# Patient Record
Sex: Female | Born: 1940 | Race: White | Hispanic: No | Marital: Married | Smoking: Former smoker
Health system: Southern US, Community
[De-identification: ages and names within clinical notes are randomized; demographics above are authoritative.]

## PROBLEM LIST (undated history)

## (undated) DIAGNOSIS — I10 Essential (primary) hypertension: Secondary | ICD-10-CM

## (undated) DIAGNOSIS — D649 Anemia, unspecified: Secondary | ICD-10-CM

## (undated) DIAGNOSIS — D126 Benign neoplasm of colon, unspecified: Secondary | ICD-10-CM

## (undated) DIAGNOSIS — E785 Hyperlipidemia, unspecified: Secondary | ICD-10-CM

## (undated) DIAGNOSIS — Z86711 Personal history of pulmonary embolism: Secondary | ICD-10-CM

## (undated) DIAGNOSIS — J449 Chronic obstructive pulmonary disease, unspecified: Secondary | ICD-10-CM

## (undated) DIAGNOSIS — Z9081 Acquired absence of spleen: Secondary | ICD-10-CM

## (undated) DIAGNOSIS — R350 Frequency of micturition: Secondary | ICD-10-CM

## (undated) DIAGNOSIS — IMO0001 Reserved for inherently not codable concepts without codable children: Secondary | ICD-10-CM

## (undated) DIAGNOSIS — I2510 Atherosclerotic heart disease of native coronary artery without angina pectoris: Secondary | ICD-10-CM

## (undated) DIAGNOSIS — M5090 Cervical disc disorder, unspecified, unspecified cervical region: Secondary | ICD-10-CM

## (undated) DIAGNOSIS — I82409 Acute embolism and thrombosis of unspecified deep veins of unspecified lower extremity: Secondary | ICD-10-CM

## (undated) DIAGNOSIS — N189 Chronic kidney disease, unspecified: Secondary | ICD-10-CM

## (undated) DIAGNOSIS — I714 Abdominal aortic aneurysm, without rupture, unspecified: Secondary | ICD-10-CM

## (undated) DIAGNOSIS — I639 Cerebral infarction, unspecified: Secondary | ICD-10-CM

## (undated) DIAGNOSIS — K5790 Diverticulosis of intestine, part unspecified, without perforation or abscess without bleeding: Secondary | ICD-10-CM

## (undated) DIAGNOSIS — Z951 Presence of aortocoronary bypass graft: Secondary | ICD-10-CM

## (undated) DIAGNOSIS — I219 Acute myocardial infarction, unspecified: Secondary | ICD-10-CM

## (undated) DIAGNOSIS — M069 Rheumatoid arthritis, unspecified: Secondary | ICD-10-CM

## (undated) DIAGNOSIS — M353 Polymyalgia rheumatica: Secondary | ICD-10-CM

## (undated) DIAGNOSIS — E039 Hypothyroidism, unspecified: Secondary | ICD-10-CM

## (undated) HISTORY — PX: CERVICAL SPINE SURGERY: SHX589

## (undated) HISTORY — PX: INGUINAL HERNIA REPAIR: SUR1180

## (undated) HISTORY — DX: Abdominal aortic aneurysm, without rupture: I71.4

## (undated) HISTORY — PX: COLONOSCOPY: SHX5424

## (undated) HISTORY — DX: Acute myocardial infarction, unspecified: I21.9

## (undated) HISTORY — DX: Abdominal aortic aneurysm, without rupture, unspecified: I71.40

## (undated) HISTORY — DX: Benign neoplasm of colon, unspecified: D12.6

## (undated) HISTORY — DX: Presence of aortocoronary bypass graft: Z95.1

## (undated) HISTORY — DX: Polymyalgia rheumatica: M35.3

## (undated) HISTORY — DX: Atherosclerotic heart disease of native coronary artery without angina pectoris: I25.10

## (undated) HISTORY — DX: Rheumatoid arthritis, unspecified: M06.9

## (undated) HISTORY — PX: OTHER SURGICAL HISTORY: SHX169

## (undated) HISTORY — PX: CARPAL TUNNEL RELEASE: SHX101

## (undated) HISTORY — DX: Diverticulosis of intestine, part unspecified, without perforation or abscess without bleeding: K57.90

## (undated) HISTORY — DX: Anemia, unspecified: D64.9

## (undated) HISTORY — DX: Hypothyroidism, unspecified: E03.9

## (undated) HISTORY — DX: Chronic obstructive pulmonary disease, unspecified: J44.9

## (undated) HISTORY — DX: Cervical disc disorder, unspecified, unspecified cervical region: M50.90

## (undated) HISTORY — DX: Essential (primary) hypertension: I10

## (undated) HISTORY — PX: CATARACT EXTRACTION: SUR2

## (undated) HISTORY — PX: APPENDECTOMY: SHX54

## (undated) HISTORY — PX: SPLENECTOMY: SUR1306

## (undated) HISTORY — DX: Hyperlipidemia, unspecified: E78.5

## (undated) HISTORY — DX: Acquired absence of spleen: Z90.81

---

## 1994-05-01 DIAGNOSIS — Z951 Presence of aortocoronary bypass graft: Secondary | ICD-10-CM

## 1994-05-01 HISTORY — PX: CORONARY ARTERY BYPASS GRAFT: SHX141

## 1994-05-01 HISTORY — PX: ABDOMINAL AORTIC ANEURYSM REPAIR: SUR1152

## 1994-05-01 HISTORY — DX: Presence of aortocoronary bypass graft: Z95.1

## 1995-01-01 DIAGNOSIS — Z951 Presence of aortocoronary bypass graft: Secondary | ICD-10-CM | POA: Insufficient documentation

## 1995-01-30 DIAGNOSIS — I2510 Atherosclerotic heart disease of native coronary artery without angina pectoris: Secondary | ICD-10-CM | POA: Insufficient documentation

## 1997-05-04 ENCOUNTER — Encounter: Payer: Self-pay | Admitting: Internal Medicine

## 1997-11-23 ENCOUNTER — Emergency Department (HOSPITAL_COMMUNITY): Admission: EM | Admit: 1997-11-23 | Discharge: 1997-11-23 | Payer: Self-pay | Admitting: Emergency Medicine

## 1998-05-12 ENCOUNTER — Ambulatory Visit (HOSPITAL_COMMUNITY): Admission: RE | Admit: 1998-05-12 | Discharge: 1998-05-12 | Payer: Self-pay | Admitting: Obstetrics and Gynecology

## 1998-05-12 ENCOUNTER — Encounter: Payer: Self-pay | Admitting: Obstetrics and Gynecology

## 1999-05-05 ENCOUNTER — Encounter: Admission: RE | Admit: 1999-05-05 | Discharge: 1999-05-05 | Payer: Self-pay | Admitting: Obstetrics and Gynecology

## 1999-05-05 ENCOUNTER — Encounter: Payer: Self-pay | Admitting: Obstetrics and Gynecology

## 1999-05-17 ENCOUNTER — Ambulatory Visit (HOSPITAL_COMMUNITY): Admission: RE | Admit: 1999-05-17 | Discharge: 1999-05-17 | Payer: Self-pay | Admitting: Internal Medicine

## 1999-05-17 ENCOUNTER — Encounter (INDEPENDENT_AMBULATORY_CARE_PROVIDER_SITE_OTHER): Payer: Self-pay | Admitting: Specialist

## 1999-05-23 ENCOUNTER — Ambulatory Visit (HOSPITAL_COMMUNITY): Admission: RE | Admit: 1999-05-23 | Discharge: 1999-05-23 | Payer: Self-pay | Admitting: Obstetrics and Gynecology

## 1999-05-23 ENCOUNTER — Encounter: Payer: Self-pay | Admitting: Obstetrics and Gynecology

## 2000-05-31 ENCOUNTER — Ambulatory Visit (HOSPITAL_COMMUNITY): Admission: RE | Admit: 2000-05-31 | Discharge: 2000-05-31 | Payer: Self-pay | Admitting: Obstetrics and Gynecology

## 2000-05-31 ENCOUNTER — Encounter: Payer: Self-pay | Admitting: Obstetrics and Gynecology

## 2000-06-22 ENCOUNTER — Other Ambulatory Visit: Admission: RE | Admit: 2000-06-22 | Discharge: 2000-06-22 | Payer: Self-pay | Admitting: Obstetrics and Gynecology

## 2000-07-16 LAB — HM COLONOSCOPY

## 2000-08-28 ENCOUNTER — Ambulatory Visit (HOSPITAL_COMMUNITY): Admission: RE | Admit: 2000-08-28 | Discharge: 2000-08-28 | Payer: Self-pay | Admitting: Cardiology

## 2001-06-03 ENCOUNTER — Ambulatory Visit (HOSPITAL_COMMUNITY): Admission: RE | Admit: 2001-06-03 | Discharge: 2001-06-03 | Payer: Self-pay | Admitting: *Deleted

## 2001-06-07 ENCOUNTER — Other Ambulatory Visit: Admission: RE | Admit: 2001-06-07 | Discharge: 2001-06-07 | Payer: Self-pay | Admitting: *Deleted

## 2001-10-14 ENCOUNTER — Encounter: Payer: Self-pay | Admitting: *Deleted

## 2001-10-14 ENCOUNTER — Ambulatory Visit (HOSPITAL_COMMUNITY): Admission: RE | Admit: 2001-10-14 | Discharge: 2001-10-14 | Payer: Self-pay | Admitting: *Deleted

## 2002-05-07 ENCOUNTER — Encounter: Payer: Self-pay | Admitting: Emergency Medicine

## 2002-05-07 ENCOUNTER — Emergency Department (HOSPITAL_COMMUNITY): Admission: EM | Admit: 2002-05-07 | Discharge: 2002-05-07 | Payer: Self-pay | Admitting: Emergency Medicine

## 2002-07-22 ENCOUNTER — Ambulatory Visit (HOSPITAL_COMMUNITY): Admission: RE | Admit: 2002-07-22 | Discharge: 2002-07-22 | Payer: Self-pay | Admitting: *Deleted

## 2002-07-24 ENCOUNTER — Encounter: Admission: RE | Admit: 2002-07-24 | Discharge: 2002-07-24 | Payer: Self-pay | Admitting: *Deleted

## 2002-08-04 ENCOUNTER — Other Ambulatory Visit: Admission: RE | Admit: 2002-08-04 | Discharge: 2002-08-04 | Payer: Self-pay | Admitting: *Deleted

## 2003-05-02 HISTORY — PX: CARDIAC CATHETERIZATION: SHX172

## 2003-09-14 ENCOUNTER — Ambulatory Visit (HOSPITAL_COMMUNITY): Admission: RE | Admit: 2003-09-14 | Discharge: 2003-09-14 | Payer: Self-pay | Admitting: *Deleted

## 2003-10-22 ENCOUNTER — Ambulatory Visit (HOSPITAL_COMMUNITY): Admission: RE | Admit: 2003-10-22 | Discharge: 2003-10-22 | Payer: Self-pay | Admitting: *Deleted

## 2003-12-11 ENCOUNTER — Encounter: Admission: RE | Admit: 2003-12-11 | Discharge: 2003-12-11 | Payer: Self-pay | Admitting: *Deleted

## 2003-12-25 ENCOUNTER — Emergency Department (HOSPITAL_COMMUNITY): Admission: EM | Admit: 2003-12-25 | Discharge: 2003-12-26 | Payer: Self-pay | Admitting: Emergency Medicine

## 2004-01-01 ENCOUNTER — Ambulatory Visit (HOSPITAL_COMMUNITY): Admission: RE | Admit: 2004-01-01 | Discharge: 2004-01-01 | Payer: Self-pay | Admitting: Cardiology

## 2004-03-10 ENCOUNTER — Emergency Department (HOSPITAL_COMMUNITY): Admission: EM | Admit: 2004-03-10 | Discharge: 2004-03-10 | Payer: Self-pay | Admitting: Emergency Medicine

## 2004-03-15 ENCOUNTER — Ambulatory Visit: Payer: Self-pay | Admitting: Internal Medicine

## 2004-05-04 ENCOUNTER — Ambulatory Visit: Payer: Self-pay | Admitting: Internal Medicine

## 2004-07-26 ENCOUNTER — Ambulatory Visit: Payer: Self-pay | Admitting: Internal Medicine

## 2004-08-10 ENCOUNTER — Ambulatory Visit: Payer: Self-pay | Admitting: Internal Medicine

## 2004-09-27 ENCOUNTER — Ambulatory Visit: Payer: Self-pay | Admitting: Internal Medicine

## 2004-09-28 ENCOUNTER — Ambulatory Visit (HOSPITAL_COMMUNITY): Admission: RE | Admit: 2004-09-28 | Discharge: 2004-09-28 | Payer: Self-pay | Admitting: *Deleted

## 2004-10-05 ENCOUNTER — Encounter: Admission: RE | Admit: 2004-10-05 | Discharge: 2004-10-05 | Payer: Self-pay | Admitting: *Deleted

## 2004-11-22 ENCOUNTER — Ambulatory Visit: Payer: Self-pay | Admitting: Internal Medicine

## 2005-02-15 ENCOUNTER — Ambulatory Visit: Payer: Self-pay | Admitting: Internal Medicine

## 2005-02-22 ENCOUNTER — Ambulatory Visit: Payer: Self-pay | Admitting: Internal Medicine

## 2005-03-29 ENCOUNTER — Ambulatory Visit: Payer: Self-pay | Admitting: Internal Medicine

## 2005-04-05 ENCOUNTER — Ambulatory Visit: Payer: Self-pay | Admitting: Internal Medicine

## 2005-07-06 ENCOUNTER — Ambulatory Visit: Payer: Self-pay | Admitting: Internal Medicine

## 2005-08-03 ENCOUNTER — Emergency Department (HOSPITAL_COMMUNITY): Admission: EM | Admit: 2005-08-03 | Discharge: 2005-08-03 | Payer: Self-pay | Admitting: Emergency Medicine

## 2005-10-12 ENCOUNTER — Ambulatory Visit (HOSPITAL_COMMUNITY): Admission: RE | Admit: 2005-10-12 | Discharge: 2005-10-12 | Payer: Self-pay | Admitting: *Deleted

## 2005-10-16 ENCOUNTER — Ambulatory Visit: Payer: Self-pay | Admitting: Internal Medicine

## 2005-10-17 ENCOUNTER — Ambulatory Visit: Payer: Self-pay | Admitting: Internal Medicine

## 2005-12-25 ENCOUNTER — Encounter: Admission: RE | Admit: 2005-12-25 | Discharge: 2005-12-25 | Payer: Self-pay | Admitting: *Deleted

## 2006-01-11 ENCOUNTER — Ambulatory Visit: Payer: Self-pay | Admitting: Internal Medicine

## 2006-01-17 ENCOUNTER — Ambulatory Visit: Payer: Self-pay | Admitting: Internal Medicine

## 2006-05-18 ENCOUNTER — Ambulatory Visit: Payer: Self-pay | Admitting: Internal Medicine

## 2006-05-18 LAB — CONVERTED CEMR LAB
ALT: 15 units/L (ref 0–40)
AST: 19 units/L (ref 0–37)
Alkaline Phosphatase: 59 units/L (ref 39–117)
BUN: 29 mg/dL — ABNORMAL HIGH (ref 6–23)
Calcium: 9.9 mg/dL (ref 8.4–10.5)
Cholesterol: 247 mg/dL (ref 0–200)
Direct LDL: 156.5 mg/dL
GFR calc Af Amer: 48 mL/min
GFR calc non Af Amer: 40 mL/min
Potassium: 4.5 meq/L (ref 3.5–5.1)
Total Protein: 6.7 g/dL (ref 6.0–8.3)

## 2006-05-21 ENCOUNTER — Ambulatory Visit: Payer: Self-pay | Admitting: Internal Medicine

## 2006-08-23 ENCOUNTER — Ambulatory Visit: Payer: Self-pay | Admitting: Internal Medicine

## 2006-08-23 LAB — CONVERTED CEMR LAB
ALT: 17 units/L (ref 0–40)
AST: 23 units/L (ref 0–37)
BUN: 32 mg/dL — ABNORMAL HIGH (ref 6–23)
Sodium: 144 meq/L (ref 135–145)
Total CHOL/HDL Ratio: 3.1

## 2006-08-28 ENCOUNTER — Ambulatory Visit: Payer: Self-pay | Admitting: Internal Medicine

## 2006-10-15 ENCOUNTER — Ambulatory Visit (HOSPITAL_COMMUNITY): Admission: RE | Admit: 2006-10-15 | Discharge: 2006-10-15 | Payer: Self-pay | Admitting: Internal Medicine

## 2006-10-19 ENCOUNTER — Encounter: Admission: RE | Admit: 2006-10-19 | Discharge: 2006-10-19 | Payer: Self-pay | Admitting: Internal Medicine

## 2006-10-23 ENCOUNTER — Emergency Department (HOSPITAL_COMMUNITY): Admission: EM | Admit: 2006-10-23 | Discharge: 2006-10-23 | Payer: Self-pay | Admitting: Emergency Medicine

## 2006-11-28 ENCOUNTER — Encounter: Payer: Self-pay | Admitting: Emergency Medicine

## 2006-11-29 ENCOUNTER — Inpatient Hospital Stay (HOSPITAL_COMMUNITY): Admission: EM | Admit: 2006-11-29 | Discharge: 2006-12-09 | Payer: Self-pay | Admitting: Internal Medicine

## 2006-12-08 ENCOUNTER — Encounter: Payer: Self-pay | Admitting: Internal Medicine

## 2006-12-08 DIAGNOSIS — E039 Hypothyroidism, unspecified: Secondary | ICD-10-CM | POA: Insufficient documentation

## 2006-12-08 DIAGNOSIS — K449 Diaphragmatic hernia without obstruction or gangrene: Secondary | ICD-10-CM

## 2006-12-08 DIAGNOSIS — Q8909 Congenital malformations of spleen: Secondary | ICD-10-CM

## 2006-12-08 DIAGNOSIS — I729 Aneurysm of unspecified site: Secondary | ICD-10-CM | POA: Insufficient documentation

## 2006-12-08 DIAGNOSIS — H3530 Unspecified macular degeneration: Secondary | ICD-10-CM | POA: Insufficient documentation

## 2006-12-08 DIAGNOSIS — I10 Essential (primary) hypertension: Secondary | ICD-10-CM | POA: Insufficient documentation

## 2006-12-08 DIAGNOSIS — N189 Chronic kidney disease, unspecified: Secondary | ICD-10-CM

## 2006-12-15 ENCOUNTER — Ambulatory Visit: Payer: Self-pay | Admitting: Family Medicine

## 2006-12-27 ENCOUNTER — Encounter: Admission: RE | Admit: 2006-12-27 | Discharge: 2006-12-27 | Payer: Self-pay | Admitting: Neurosurgery

## 2007-01-17 ENCOUNTER — Encounter: Admission: RE | Admit: 2007-01-17 | Discharge: 2007-01-17 | Payer: Self-pay | Admitting: Neurosurgery

## 2007-02-08 ENCOUNTER — Encounter: Admission: RE | Admit: 2007-02-08 | Discharge: 2007-02-08 | Payer: Self-pay | Admitting: Neurosurgery

## 2007-03-25 ENCOUNTER — Encounter: Payer: Self-pay | Admitting: Internal Medicine

## 2007-04-08 ENCOUNTER — Encounter: Payer: Self-pay | Admitting: Internal Medicine

## 2007-05-20 ENCOUNTER — Ambulatory Visit: Payer: Self-pay | Admitting: Internal Medicine

## 2007-05-20 LAB — CONVERTED CEMR LAB
Albumin: 4 g/dL (ref 3.5–5.2)
Alkaline Phosphatase: 57 units/L (ref 39–117)
BUN: 39 mg/dL — ABNORMAL HIGH (ref 6–23)
Calcium: 9.8 mg/dL (ref 8.4–10.5)
Creatinine, Ser: 1.5 mg/dL — ABNORMAL HIGH (ref 0.4–1.2)
GFR calc Af Amer: 44 mL/min
HDL: 71.1 mg/dL (ref >39.0)
Potassium: 5 meq/L (ref 3.5–5.1)
Total Bilirubin: 0.9 mg/dL (ref 0.3–1.2)
VLDL: 31 mg/dL (ref 0–40)
Vit D, 1,25-Dihydroxy: 43 (ref 30–89)

## 2007-05-27 ENCOUNTER — Ambulatory Visit: Payer: Self-pay | Admitting: Internal Medicine

## 2007-05-28 ENCOUNTER — Encounter: Admission: RE | Admit: 2007-05-28 | Discharge: 2007-05-28 | Payer: Self-pay | Admitting: Neurosurgery

## 2007-05-28 ENCOUNTER — Encounter: Payer: Self-pay | Admitting: Internal Medicine

## 2007-06-18 ENCOUNTER — Telehealth: Payer: Self-pay | Admitting: Internal Medicine

## 2007-09-24 ENCOUNTER — Ambulatory Visit: Payer: Self-pay | Admitting: Internal Medicine

## 2007-09-25 LAB — CONVERTED CEMR LAB
Albumin: 3.9 g/dL (ref 3.5–5.2)
BUN: 25 mg/dL — ABNORMAL HIGH (ref 6–23)
Calcium: 10 mg/dL (ref 8.4–10.5)
Cholesterol: 183 mg/dL (ref 0–200)
GFR calc Af Amer: 57 mL/min
Glucose, Bld: 105 mg/dL — ABNORMAL HIGH (ref 70–99)
HDL: 60.2 mg/dL (ref >39.0)
Total Protein: 6.6 g/dL (ref 6.0–8.3)
VLDL: 33 mg/dL (ref 0–40)

## 2007-10-16 ENCOUNTER — Ambulatory Visit (HOSPITAL_COMMUNITY): Admission: RE | Admit: 2007-10-16 | Discharge: 2007-10-16 | Payer: Self-pay | Admitting: Obstetrics

## 2008-01-07 ENCOUNTER — Encounter: Payer: Self-pay | Admitting: *Deleted

## 2008-01-08 ENCOUNTER — Encounter: Admission: RE | Admit: 2008-01-08 | Discharge: 2008-01-08 | Payer: Self-pay | Admitting: *Deleted

## 2008-01-15 ENCOUNTER — Ambulatory Visit: Payer: Self-pay | Admitting: Internal Medicine

## 2008-01-16 ENCOUNTER — Ambulatory Visit: Payer: Self-pay | Admitting: *Deleted

## 2008-01-27 ENCOUNTER — Ambulatory Visit: Payer: Self-pay | Admitting: Internal Medicine

## 2008-01-27 DIAGNOSIS — R05 Cough: Secondary | ICD-10-CM

## 2008-03-23 ENCOUNTER — Ambulatory Visit: Payer: Self-pay | Admitting: Internal Medicine

## 2008-03-23 DIAGNOSIS — H01009 Unspecified blepharitis unspecified eye, unspecified eyelid: Secondary | ICD-10-CM | POA: Insufficient documentation

## 2008-03-23 DIAGNOSIS — R197 Diarrhea, unspecified: Secondary | ICD-10-CM

## 2008-03-23 DIAGNOSIS — L719 Rosacea, unspecified: Secondary | ICD-10-CM | POA: Insufficient documentation

## 2008-03-24 ENCOUNTER — Encounter: Payer: Self-pay | Admitting: Internal Medicine

## 2008-03-24 LAB — CONVERTED CEMR LAB
ALT: 32 units/L (ref 0–35)
AST: 35 units/L (ref 0–37)
Albumin: 3.9 g/dL (ref 3.5–5.2)
HDL: 86.6 mg/dL (ref >39.0)
TSH: 5.83 microintl units/mL — ABNORMAL HIGH (ref 0.35–5.50)
Triglycerides: 92 mg/dL (ref 0–149)

## 2008-03-30 ENCOUNTER — Encounter: Payer: Self-pay | Admitting: Internal Medicine

## 2008-04-10 ENCOUNTER — Telehealth: Payer: Self-pay | Admitting: Internal Medicine

## 2008-06-10 ENCOUNTER — Telehealth: Payer: Self-pay | Admitting: Internal Medicine

## 2008-07-15 ENCOUNTER — Ambulatory Visit: Payer: Self-pay | Admitting: Internal Medicine

## 2008-07-21 ENCOUNTER — Ambulatory Visit: Payer: Self-pay | Admitting: Internal Medicine

## 2008-09-16 ENCOUNTER — Encounter: Payer: Self-pay | Admitting: Internal Medicine

## 2008-10-16 ENCOUNTER — Ambulatory Visit (HOSPITAL_COMMUNITY): Admission: RE | Admit: 2008-10-16 | Discharge: 2008-10-16 | Payer: Self-pay | Admitting: *Deleted

## 2008-11-10 ENCOUNTER — Ambulatory Visit: Payer: Self-pay | Admitting: Internal Medicine

## 2008-11-10 LAB — CONVERTED CEMR LAB
Alkaline Phosphatase: 95 units/L (ref 39–117)
BUN: 24 mg/dL — ABNORMAL HIGH (ref 6–23)
Bilirubin, Direct: 0.2 mg/dL (ref 0.0–0.3)
Chloride: 107 meq/L (ref 96–112)
Cholesterol: 183 mg/dL (ref 0–200)
Creatinine, Ser: 1.2 mg/dL (ref 0.4–1.2)
GFR calc non Af Amer: 47.16 mL/min (ref >60)
LDL Cholesterol: 92 mg/dL (ref 0–99)
Total Bilirubin: 0.7 mg/dL (ref 0.3–1.2)
Total Protein: 6.6 g/dL (ref 6.0–8.3)
Vit D, 25-Hydroxy: 41 ng/mL (ref 30–89)

## 2008-11-17 ENCOUNTER — Ambulatory Visit: Payer: Self-pay | Admitting: Internal Medicine

## 2008-11-26 ENCOUNTER — Telehealth: Payer: Self-pay | Admitting: Internal Medicine

## 2009-01-06 ENCOUNTER — Ambulatory Visit: Payer: Self-pay | Admitting: Internal Medicine

## 2009-05-01 DIAGNOSIS — M069 Rheumatoid arthritis, unspecified: Secondary | ICD-10-CM

## 2009-05-01 HISTORY — DX: Rheumatoid arthritis, unspecified: M06.9

## 2009-05-04 ENCOUNTER — Ambulatory Visit: Payer: Self-pay | Admitting: Internal Medicine

## 2009-05-04 LAB — CONVERTED CEMR LAB
ALT: 33 units/L (ref 0–35)
AST: 27 units/L (ref 0–37)
Alkaline Phosphatase: 81 units/L (ref 39–117)
Bilirubin, Direct: 0.2 mg/dL (ref 0.0–0.3)
CO2: 27 meq/L (ref 19–32)
Chloride: 105 meq/L (ref 96–112)
Creatinine, Ser: 1.1 mg/dL (ref 0.4–1.2)
LDL Cholesterol: 89 mg/dL (ref 0–99)
Potassium: 4.3 meq/L (ref 3.5–5.1)
Sodium: 141 meq/L (ref 135–145)
TSH: 0.81 microintl units/mL (ref 0.35–5.50)
Total CHOL/HDL Ratio: 2
Total Protein: 6.8 g/dL (ref 6.0–8.3)

## 2009-05-18 ENCOUNTER — Ambulatory Visit: Payer: Self-pay | Admitting: Internal Medicine

## 2009-05-18 DIAGNOSIS — Z87891 Personal history of nicotine dependence: Secondary | ICD-10-CM

## 2009-05-25 ENCOUNTER — Encounter: Payer: Self-pay | Admitting: Internal Medicine

## 2009-06-01 ENCOUNTER — Telehealth: Payer: Self-pay | Admitting: Internal Medicine

## 2009-07-06 ENCOUNTER — Telehealth: Payer: Self-pay | Admitting: Internal Medicine

## 2009-09-21 ENCOUNTER — Ambulatory Visit: Payer: Self-pay | Admitting: Internal Medicine

## 2009-09-22 LAB — CONVERTED CEMR LAB
ALT: 52 units/L — ABNORMAL HIGH (ref 0–35)
AST: 48 units/L — ABNORMAL HIGH (ref 0–37)
Bilirubin, Direct: 0.1 mg/dL (ref 0.0–0.3)
Chloride: 105 meq/L (ref 96–112)
GFR calc non Af Amer: 56.11 mL/min (ref >60)
LDL Cholesterol: 92 mg/dL (ref 0–99)
Potassium: 5 meq/L (ref 3.5–5.1)
Sodium: 143 meq/L (ref 135–145)
Total Bilirubin: 0.9 mg/dL (ref 0.3–1.2)
Total CHOL/HDL Ratio: 2
VLDL: 15.8 mg/dL (ref 0.0–40.0)

## 2009-09-24 ENCOUNTER — Ambulatory Visit: Payer: Self-pay | Admitting: Internal Medicine

## 2009-09-24 DIAGNOSIS — Z8639 Personal history of other endocrine, nutritional and metabolic disease: Secondary | ICD-10-CM

## 2009-09-24 DIAGNOSIS — Z862 Personal history of diseases of the blood and blood-forming organs and certain disorders involving the immune mechanism: Secondary | ICD-10-CM | POA: Insufficient documentation

## 2009-09-24 DIAGNOSIS — R21 Rash and other nonspecific skin eruption: Secondary | ICD-10-CM | POA: Insufficient documentation

## 2009-09-30 ENCOUNTER — Encounter (INDEPENDENT_AMBULATORY_CARE_PROVIDER_SITE_OTHER): Payer: Self-pay | Admitting: *Deleted

## 2009-10-05 ENCOUNTER — Encounter: Admission: RE | Admit: 2009-10-05 | Discharge: 2009-10-05 | Payer: Self-pay | Admitting: Internal Medicine

## 2009-10-26 ENCOUNTER — Ambulatory Visit (HOSPITAL_COMMUNITY): Admission: RE | Admit: 2009-10-26 | Discharge: 2009-10-26 | Payer: Self-pay | Admitting: Obstetrics

## 2009-10-28 ENCOUNTER — Telehealth: Payer: Self-pay | Admitting: Internal Medicine

## 2009-10-28 ENCOUNTER — Ambulatory Visit: Payer: Self-pay | Admitting: Internal Medicine

## 2009-11-02 LAB — CONVERTED CEMR LAB
ALT: 26 units/L (ref 0–35)
AST: 26 units/L (ref 0–37)
Bilirubin, Direct: 0.1 mg/dL (ref 0.0–0.3)
Direct LDL: 162.1 mg/dL
HDL: 67.1 mg/dL (ref >39.00)
Total Bilirubin: 0.7 mg/dL (ref 0.3–1.2)
Total Protein: 7 g/dL (ref 6.0–8.3)
Triglycerides: 91 mg/dL (ref 0.0–149.0)

## 2009-11-26 ENCOUNTER — Ambulatory Visit: Payer: Self-pay | Admitting: Internal Medicine

## 2009-11-26 DIAGNOSIS — M2560 Stiffness of unspecified joint, not elsewhere classified: Secondary | ICD-10-CM | POA: Insufficient documentation

## 2009-11-26 DIAGNOSIS — R209 Unspecified disturbances of skin sensation: Secondary | ICD-10-CM

## 2009-11-26 DIAGNOSIS — M2550 Pain in unspecified joint: Secondary | ICD-10-CM

## 2009-11-26 DIAGNOSIS — M79609 Pain in unspecified limb: Secondary | ICD-10-CM | POA: Insufficient documentation

## 2009-11-26 LAB — CONVERTED CEMR LAB
Alkaline Phosphatase: 107 units/L (ref 39–117)
Basophils Absolute: 0 10*3/uL (ref 0.0–0.1)
Bilirubin, Direct: 0.1 mg/dL (ref 0.0–0.3)
CO2: 29 meq/L (ref 19–32)
Calcium: 9.4 mg/dL (ref 8.4–10.5)
Creatinine, Ser: 1.4 mg/dL — ABNORMAL HIGH (ref 0.4–1.2)
Eosinophils Absolute: 0.3 10*3/uL (ref 0.0–0.7)
GFR calc non Af Amer: 38.4 mL/min (ref >60)
HCT: 39.7 % (ref 36.0–46.0)
Hemoglobin: 13 g/dL (ref 12.0–15.0)
Lymphs Abs: 2.7 10*3/uL (ref 0.7–4.0)
MCHC: 32.9 g/dL (ref 30.0–36.0)
Monocytes Absolute: 1 10*3/uL (ref 0.1–1.0)
Monocytes Relative: 8.9 % (ref 3.0–12.0)
Neutro Abs: 6.8 10*3/uL (ref 1.4–7.7)
Platelets: 402 10*3/uL — ABNORMAL HIGH (ref 150.0–400.0)
RDW: 14.8 % — ABNORMAL HIGH (ref 11.5–14.6)
Sed Rate: 32 mm/hr — ABNORMAL HIGH (ref 0–22)
Sodium: 134 meq/L — ABNORMAL LOW (ref 135–145)
TSH: 2.73 microintl units/mL (ref 0.35–5.50)
Total Bilirubin: 0.6 mg/dL (ref 0.3–1.2)
Total CK: 38 units/L (ref 7–177)
Total Protein: 6.8 g/dL (ref 6.0–8.3)
Vitamin B-12: 670 pg/mL (ref 211–911)

## 2009-11-30 ENCOUNTER — Telehealth: Payer: Self-pay | Admitting: Internal Medicine

## 2009-12-17 ENCOUNTER — Ambulatory Visit: Payer: Self-pay | Admitting: Internal Medicine

## 2009-12-28 ENCOUNTER — Encounter: Admission: RE | Admit: 2009-12-28 | Discharge: 2009-12-28 | Payer: Self-pay | Admitting: Surgery

## 2010-01-11 ENCOUNTER — Encounter: Payer: Self-pay | Admitting: Internal Medicine

## 2010-01-17 ENCOUNTER — Ambulatory Visit: Payer: Self-pay | Admitting: Surgery

## 2010-01-17 ENCOUNTER — Encounter: Payer: Self-pay | Admitting: Internal Medicine

## 2010-02-24 ENCOUNTER — Encounter: Payer: Self-pay | Admitting: Internal Medicine

## 2010-03-15 ENCOUNTER — Ambulatory Visit: Payer: Self-pay | Admitting: Internal Medicine

## 2010-03-15 LAB — CONVERTED CEMR LAB
BUN: 28 mg/dL — ABNORMAL HIGH (ref 6–23)
Chloride: 99 meq/L (ref 96–112)
Eosinophils Relative: 1 % (ref 0.0–5.0)
GFR calc non Af Amer: 40.32 mL/min (ref >60)
HCT: 40.7 % (ref 36.0–46.0)
MCV: 101.9 fL — ABNORMAL HIGH (ref 78.0–100.0)
Monocytes Relative: 12 % (ref 3.0–12.0)
Platelets: 338 10*3/uL (ref 150.0–400.0)
Potassium: 4.4 meq/L (ref 3.5–5.1)
RBC: 3.99 M/uL (ref 3.87–5.11)
Sed Rate: 5 mm/hr (ref 0–22)
Sodium: 139 meq/L (ref 135–145)
WBC: 15.1 10*3/uL — ABNORMAL HIGH (ref 4.5–10.5)

## 2010-03-22 ENCOUNTER — Ambulatory Visit: Payer: Self-pay | Admitting: Internal Medicine

## 2010-04-08 ENCOUNTER — Encounter: Payer: Self-pay | Admitting: Internal Medicine

## 2010-04-11 ENCOUNTER — Encounter
Admission: RE | Admit: 2010-04-11 | Discharge: 2010-04-11 | Payer: Self-pay | Source: Home / Self Care | Attending: Obstetrics | Admitting: Obstetrics

## 2010-05-27 ENCOUNTER — Encounter: Payer: Self-pay | Admitting: Internal Medicine

## 2010-05-31 NOTE — Progress Notes (Signed)
Summary: REFILL ON METOPROLOL  Phone Note Call from Patient Call back at Home Phone [phone]   Caller: Morrice Summary of Call: PT IS REQUESTING REFILLS ON METOPROLOL (TOPROL XL) 75 MG.  HE SAYS IT WILL RUN OUT BEFORE HIS NEXT ON SEPT 27.  HE USES [facility].  HIS PHONE NUMBER IS [phone]. Initial call taken by: Glena Norfolk,  October 28, 2009 9:17 AM    Prescriptions: TOPROL XL 25 MG XR24H-TAB (METOPROLOL SUCCINATE) 3 once daily  #270 x 1   Entered by:   Charlsie Quest, CMA   Authorized by:   Cassandria Anger MD   Signed by:   Charlsie Quest, CMA on 10/28/2009   Method used:   Electronically to        [facility]. [phone]* (retail)       [address]       [place], [place]  [address]       Ph: [phone]       Fax: [phone]   RxID:   [id number]

## 2010-05-31 NOTE — Assessment & Plan Note (Signed)
Summary: 3 wk rov /nws  #   Vital Signs:  Patient profile:   70 year old female Height:      63 inches Weight:      173 pounds BMI:     30.76 O2 Sat:      95 % on Room air Temp:     97.7 degrees F oral Pulse rate:   75 / minute Pulse rhythm:   regular Resp:     16 per minute BP sitting:   120 / 80  (left arm) Cuff size:   regular  Vitals Entered By: Jonathon Resides, CMA(AAMA) (December 17, 2009 9:13 AM)  O2 Flow:  Room air CC: 3 wk f/u Is Patient Diabetic? No   CC:  3 wk f/u.  History of Present Illness: F/u stiffness and pains - much better on Prednisone - back to nl  Current Medications (verified): 1)  Crestor 40 Mg Tabs (Rosuvastatin Calcium) .[name].. 1 By Mouth Qd 2)  Toprol Xl 25 Mg Xr24h-Tab (Metoprolol Succinate) .... 3 Once Daily 3)  Plavix 75 Mg  Tabs (Clopidogrel Bisulfate) .... Once Daily 4)  Vitamin D3 1000 Unit  Tabs (Cholecalciferol) .[name].. 1 Qd 5)  Vitamin-B Complex   Tabs (B Complex Vitamins) .[name].. 1 Qd 6)  Flonase 50 Mcg/act Susp (Fluticasone Propionate) .... 2 Sprays Each Day 7)  Tussionex Pennkinetic Er 8-10 Mg/43ml Lqcr (Chlorpheniramine-Hydrocodone) .[name].. 1 Tsp By Mouth Two Times A Day As Needed Cough 8)  Advair Diskus 100-50 Mcg/dose Misc (Fluticasone-Salmeterol) .[name].. 1 Puff 2 Times Daily 9)  Erythromycin 5 Mg/gm Oint (Erythromycin) .... Use Two Times A Day On Eyelids 10)  Levoxyl 88 Mcg Tabs (Levothyroxine Sodium) .[name].. 1 By Mouth Qd 11)  Cozaar 100 Mg Tabs (Losartan Potassium) .[name].. 1 By Mouth Qd 12)  Prednisone 10 Mg Tabs (Prednisone) .... 1.5 Tab Once Daily Pc 13)  Tramadol Hcl 50 Mg Tabs (Tramadol Hcl) .[name].. 1-2 Tabs By Mouth Two Times A Day As Needed Pain  Allergies (verified): 1)  ! Codeine 2)  ! Asa 3)  ! * Mtc 4)  Tramadol Hcl (Tramadol Hcl)  Past History:  Past Medical History: Coronary artery disease Hypertension Hypothyroidism cervical spine disc disease PMR 2011  Review of Systems  The patient denies fever, weight loss, weight gain, and  abdominal pain.    Physical Exam  General:  Well-developed,well-nourished,in no acute distress; alert,appropriate and cooperative throughout examination Mouth:  Oral mucosa and oropharynx without lesions or exudates.  Teeth in good repair. Lungs:  Normal respiratory effort, chest expands symmetrically. Lungs are clear to auscultation, no crackles or wheezes. Heart:  Normal rate and regular rhythm. S1 and S2 normal without gallop, murmur, click, rub or other extra sounds. Msk:  No deformity or scoliosis noted of thoracic or lumbar spine, NT.  Cervical spine is not  tender to palpation over paraspinal muscles and with the ROM . B knees are not  tender w/ROM  Skin:  Small facial papules; rosacea   Impression & Recommendations:  Problem # 1:  STIFFNESS (ICD-719.50) Assessment Improved Likely PMR  Problem # 2:  ARTHRALGIA (ICD-719.40)/PMR Assessment: Improved  Orders: Rheumatology Referral (Rheumatology)  Problem # 3:  HYPOTHYROIDISM (ICD-244.9) Assessment: Unchanged  Her updated medication list for this problem includes:    Levoxyl 88 Mcg Tabs (Levothyroxine sodium) .[name]... 1 by mouth qd  Complete Medication List: 1)  Crestor 40 Mg Tabs (Rosuvastatin calcium) .[name].. 1 by mouth qd 2)  Toprol Xl 25 Mg Xr24h-tab (Metoprolol succinate) .... 3 once  daily 3)  Plavix 75 Mg Tabs (Clopidogrel bisulfate) .... Once daily 4)  Vitamin D3 1000 Unit Tabs (Cholecalciferol) .[name].. 1 qd 5)  Vitamin-b Complex Tabs (B complex vitamins) .[name].. 1 qd 6)  Flonase 50 Mcg/act Susp (Fluticasone propionate) .... 2 sprays each day 7)  Tussionex Pennkinetic Er 8-10 Mg/32ml Lqcr (Chlorpheniramine-hydrocodone) .[name].. 1 tsp by mouth two times a day as needed cough 8)  Advair Diskus 100-50 Mcg/dose Misc (Fluticasone-salmeterol) .[name].. 1 puff 2 times daily 9)  Erythromycin 5 Mg/gm Oint (erythromycin)  .... Use two times a day on eyelids 10)  Levoxyl 88 Mcg Tabs (Levothyroxine sodium) .[name].. 1 by mouth qd 11)  Cozaar 100 Mg Tabs  (Losartan potassium) .[name].. 1 by mouth qd 12)  Tramadol Hcl 50 Mg Tabs (Tramadol hcl) .[name].. 1-2 tabs by mouth two times a day as needed pain 13)  Prednisone 1 Mg Tabs (Prednisone) .... As dirrected 14)  Prednisone 5 Mg Tabs (Prednisone) .... As dirrected  Patient Instructions: 1)  Please schedule a follow-up appointment in 3 months. 2)  BMP prior to visit, ICD-9: 3)  CBC w/ Diff prior to visit, ICD-9: 4)  ESR 725 5)  Prednisone - take 12.5 mg a day x 1 month, then 11 mg a day x 1 month, then decrease by 1 mg/day each month (take pc). Prescriptions: TOPROL XL 25 MG XR24H-TAB (METOPROLOL SUCCINATE) 3 once daily  #270 x 1   Entered and Authorized by:   Cassandria Anger MD   Signed by:   Cassandria Anger MD on 12/17/2009   Method used:   Print then Give to Patient   RxID:   [id number] COZAAR 100 MG TABS (LOSARTAN POTASSIUM) 1 by mouth qd  #90 x 3   Entered and Authorized by:   Cassandria Anger MD   Signed by:   Cassandria Anger MD on 12/17/2009   Method used:   Print then Give to Patient   RxID:   [id number] LEVOXYL 73 MCG TABS (LEVOTHYROXINE SODIUM) 1 by mouth qd  #90 x 3   Entered and Authorized by:   Cassandria Anger MD   Signed by:   Cassandria Anger MD on 12/17/2009   Method used:   Print then Give to Patient   RxID:   [id number] PLAVIX 66 MG  TABS (CLOPIDOGREL BISULFATE) once daily  #90 x 3   Entered and Authorized by:   Cassandria Anger MD   Signed by:   Cassandria Anger MD on 12/17/2009   Method used:   Print then Give to Patient   [id number] PREDNISONE 5 MG TABS (PREDNISONE) as dirrected  #100 x 3   Entered and Authorized by:   Cassandria Anger MD   Signed by:   Cassandria Anger MD on 12/17/2009   Method used:   Print then Give to Patient   [id number] PREDNISONE 1 MG TABS (PREDNISONE) as dirrected  #100 x 3   Entered and Authorized by:   Cassandria Anger MD   Signed by:   Cassandria Anger MD on  12/17/2009   Method used:   Print then Give to Patient   RxID:   [id number]

## 2010-05-31 NOTE — Letter (Signed)
Summary: [facility]  [facility]   Imported By: Phillis Knack 02/08/2010 09:09:01  _____________________________________________________________________  External Attachment:    Type:   Image     Comment:   External Document

## 2010-05-31 NOTE — Letter (Signed)
Summary: Hurley Cisco MD  Hurley Cisco MD   Imported By: Rise Patience 03/16/2010 11:50:03  _____________________________________________________________________  External Attachment:    Type:   Image     Comment:   External Document

## 2010-05-31 NOTE — Progress Notes (Signed)
  Phone Note Refill Request Message from:  Fax from Pharmacy on July 06, 2009 9:26 AM  Refills Requested: Medication #1:  CRESTOR 40 MG TABS 1 by mouth qd   Dosage confirmed as above?Dosage Confirmed   Supply Requested: 3 months  Method Requested: Fax to [facility] Initial call taken by: Estell Harpin CMA,  July 06, 2009 9:27 AM    Prescriptions: CRESTOR 40 MG TABS (ROSUVASTATIN CALCIUM) 1 by mouth qd  #90 x 3   Entered by:   Estell Harpin CMA   Authorized by:   Cassandria Anger MD   Signed by:   Estell Harpin CMA on 07/06/2009   Method used:   Faxed to ...       [facility] (mail-order)             ,          Ph: [phone]       Fax: [phone]   RxID:   [id number]

## 2010-05-31 NOTE — Progress Notes (Signed)
Summary: Pt questions for MD  Phone Note Call from Patient Call back at Home Phone [phone]   Caller: Patient Summary of Call: pt called requesting a call from MD with questions about Cozaar. I tried to ask pt what type of questions she had so I could try and help her but pt demanded to speak with MD only as she does not like to leave messages because she always has follow up questions to the answers she receives. please advise Initial call taken by: Crissie Sickles, [place],  June 01, 2009 10:34 AM  Follow-up for Phone Call        I clarified Cozaar dose Follow-up by: Cassandria Anger MD,  June 01, 2009 5:23 PM

## 2010-05-31 NOTE — Assessment & Plan Note (Signed)
Summary: 3 mos f/u #/cd   Vital Signs:  Patient profile:   70 year old female Height:      63 inches Weight:      174 pounds BMI:     30.93 Temp:     98.3 degrees F oral Pulse rate:   84 / minute Pulse rhythm:   regular Resp:     16 per minute BP sitting:   90 / 62  (left arm) Cuff size:   regular  Vitals Entered By: Jonathon Resides, CMA(AAMA) (March 22, 2010 9:38 AM) CC: 3 mo f/u Is Patient Diabetic? No   CC:  3 mo f/u.  History of Present Illness: The patient presents for a follow up of hypertension, CAD, RA, hyperlipidemia .  Current Medications (verified): 1)  Crestor 40 Mg Tabs (Rosuvastatin Calcium) .[name].. 1 By Mouth Qd 2)  Toprol Xl 25 Mg Xr24h-Tab (Metoprolol Succinate) .... 3 Once Daily 3)  Plavix 75 Mg  Tabs (Clopidogrel Bisulfate) .... Once Daily 4)  Vitamin D3 1000 Unit  Tabs (Cholecalciferol) .[name].. 1 Qd 5)  Vitamin-B Complex   Tabs (B Complex Vitamins) .[name].. 1 Qd 6)  Flonase 50 Mcg/act Susp (Fluticasone Propionate) .... 2 Sprays Each Day 7)  Tussionex Pennkinetic Er 8-10 Mg/58ml Lqcr (Chlorpheniramine-Hydrocodone) .[name].. 1 Tsp By Mouth Two Times A Day As Needed Cough 8)  Advair Diskus 100-50 Mcg/dose Misc (Fluticasone-Salmeterol) .[name].. 1 Puff 2 Times Daily 9)  Erythromycin 5 Mg/gm Oint (Erythromycin) .... Use Two Times A Day On Eyelids 10)  Levoxyl 88 Mcg Tabs (Levothyroxine Sodium) .[name].. 1 By Mouth Qd 11)  Cozaar 100 Mg Tabs (Losartan Potassium) .[name].. 1 By Mouth Qd 12)  Tramadol Hcl 50 Mg Tabs (Tramadol Hcl) .[name].. 1-2 Tabs By Mouth Two Times A Day As Needed Pain 13)  Prednisone 1 Mg Tabs (Prednisone) .... As Dirrected 14)  Prednisone 5 Mg Tabs (Prednisone) .... As Dirrected  Allergies (verified): 1)  ! Codeine 2)  ! Asa 3)  ! * Mtc 4)  Tramadol Hcl (Tramadol Hcl)  Past History:  Social History: Last updated: 07/21/2008 Retired Married Former Smoker Regular exercise-yes at [employer]  Past Medical History: Coronary artery  disease Hypertension Hypothyroidism cervical spine disc disease PMR 2011 RA 2011 Dr Charlestine Night  Review of Systems  The patient denies fever, dyspnea on exertion, and abdominal pain.    Physical Exam  General:  Well-developed,well-nourished,in no acute distress; alert,appropriate and cooperative throughout examination Eyes:  Nl B eyelids Nose:  External nasal examination shows no deformity or inflammation. Nasal mucosa are pink and moist without lesions or exudates. Mouth:  Oral mucosa and oropharynx without lesions or exudates.  Teeth in good repair. Neck:  No deformities, masses, or tenderness noted. Lungs:  Normal respiratory effort, chest expands symmetrically. Lungs are clear to auscultation, no crackles or wheezes. Heart:  Normal rate and regular rhythm. S1 and S2 normal without gallop, murmur, click, rub or other extra sounds. Abdomen:  Bowel sounds positive,abdomen soft and non-tender without masses, organomegaly or hernias noted. No HSM Msk:  No deformity or scoliosis noted of thoracic or lumbar spine, NT.  Cervical spine is not  tender to palpation over paraspinal muscles and with the ROM . B knees are not  tender w/ROM  Neurologic:  No cranial nerve deficits noted. Station and gait are normal. Plantar reflexes are down-going bilaterally. DTRs are symmetrical throughout. Sensory, motor and coordinative functions appear intact. Skin:  Small facial papules; rosacea Psych:  Cognition and judgment appear intact. Alert and cooperative  with normal attention span and concentration. No apparent delusions, illusions, hallucinations   Impression & Recommendations:  Problem # 1:  ARTHRALGIA (ICD-719.40) - poss RA Assessment Improved On the regimen of medicine(s) reflected in the chart  perr Dr Charlestine Night  Problem # 2:  STIFFNESS (ICD-719.50) Assessment: Improved  Problem # 3:  HYPOTHYROIDISM (ICD-244.9) Assessment: Unchanged  Her updated medication list for this problem includes:     Levoxyl 88 Mcg Tabs (Levothyroxine sodium) .[name]... 1 by mouth qd  Problem # 4:  RENAL FAILURE, CHRONIC (ICD-585.9) Assessment: Unchanged  Problem # 5:  CORONARY ARTERY DISEASE (ICD-414.00) Assessment: Unchanged  Her updated medication list for this problem includes:    Toprol Xl 25 Mg Xr24h-tab (Metoprolol succinate) .Marland KitchenMarland KitchenMarland KitchenMarland Kitchen 3 once daily    Plavix 75 Mg Tabs (Clopidogrel bisulfate) ..... Once daily    Cozaar 100 Mg Tabs (Losartan potassium) .[name]... 1 by mouth qd  Complete Medication List: 1)  Toprol Xl 25 Mg Xr24h-tab (Metoprolol succinate) .... 3 once daily 2)  Plavix 75 Mg Tabs (Clopidogrel bisulfate) .... Once daily 3)  Vitamin D3 1000 Unit Tabs (Cholecalciferol) .[name].. 1 qd 4)  Vitamin-b Complex Tabs (B complex vitamins) .[name].. 1 qd 5)  Flonase 50 Mcg/act Susp (Fluticasone propionate) .... 2 sprays each day 6)  Advair Diskus 100-50 Mcg/dose Misc (Fluticasone-salmeterol) .[name].. 1 puff 2 times daily 7)  Levoxyl 88 Mcg Tabs (Levothyroxine sodium) .[name].. 1 by mouth qd 8)  Cozaar 100 Mg Tabs (Losartan potassium) .[name].. 1 by mouth qd 9)  Tramadol Hcl 50 Mg Tabs (Tramadol hcl) .[name].. 1-2 tabs by mouth two times a day as needed pain 10)  Prednisone 5 Mg Tabs (Prednisone) .... As dirrected 11)  Rheumatrex 2.5 Mg Tabs (Methotrexate (anti-rheumatic)) .... 6 a day q 1 wk 12)  Lipitor 40 Mg Tabs (Atorvastatin calcium) .[name].. 1 by mouth qd  Patient Instructions: 1)  AMW w/labs 2)  Please schedule a follow-up appointment in 3 months. Prescriptions: COZAAR 100 MG TABS (LOSARTAN POTASSIUM) 1 by mouth qd  #90 x 3   Entered and Authorized by:   Cassandria Anger MD   Signed by:   Cassandria Anger MD on 03/22/2010   Method used:   Print then Give to Patient   RxID:   [id number] TOPROL XL 25 MG XR24H-TAB (METOPROLOL SUCCINATE) 3 once daily  #270 x 3   Entered and Authorized by:   Cassandria Anger MD   Signed by:   Cassandria Anger MD on 03/22/2010   Method used:   Print then Give to Patient    RxID:   [id number] LIPITOR 40 MG TABS (ATORVASTATIN CALCIUM) 1 by mouth qd  #90 x 3   Entered and Authorized by:   Cassandria Anger MD   Signed by:   Cassandria Anger MD on 03/22/2010   Method used:   Print then Give to Patient   RxID:   [id number]    Orders Added: 1)  Est. Patient Level IV [id number]   Immunization History:  Influenza Immunization History:    Influenza:  historical (01/12/2010)   Immunization History:  Influenza Immunization History:    Influenza:  Historical (01/12/2010)

## 2010-05-31 NOTE — Assessment & Plan Note (Signed)
Summary: 4 mos f/u / #/cd   Vital Signs:  Patient profile:   70 year old female Height:      63 inches Weight:      172 pounds BMI:     30.58 O2 Sat:      90 % on Room air Temp:     97.0 degrees F oral Pulse rate:   85 / minute BP sitting:   104 / 70  (left arm) Cuff size:   regular  Vitals Entered By: Charlynne Cousins CMA (Sep 24, 2009 10:40 AM)  O2 Flow:  Room air   History of Present Illness: The patient presents for a follow up of hypertension, hyperlipidemia, CAD C/o rash   Current Medications (verified): 1)  Crestor 40 Mg Tabs (Rosuvastatin Calcium) .[name].. 1 By Mouth Qd 2)  Toprol Xl 25 Mg Xr24h-Tab (Metoprolol Succinate) .... 3 Once Daily 3)  Plavix 75 Mg  Tabs (Clopidogrel Bisulfate) .... Once Daily 4)  Vitamin D3 1000 Unit  Tabs (Cholecalciferol) .[name].. 1 Qd 5)  Vitamin-B Complex   Tabs (B Complex Vitamins) .[name].. 1 Qd 6)  Flonase 50 Mcg/act Susp (Fluticasone Propionate) .... 2 Sprays Each Day 7)  Tussionex Pennkinetic Er 8-10 Mg/82ml Lqcr (Chlorpheniramine-Hydrocodone) .[name].. 1 Tsp By Mouth Two Times A Day As Needed Cough 8)  Advair Diskus 100-50 Mcg/dose Misc (Fluticasone-Salmeterol) .[name].. 1 Puff 2 Times Daily 9)  Erythromycin 5 Mg/gm Oint (Erythromycin) .... Use Two Times A Day On Eyelids 10)  Levoxyl 88 Mcg Tabs (Levothyroxine Sodium) .[name].. 1 By Mouth Qd 11)  Cozaar 100 Mg Tabs (Losartan Potassium) .[name].. 1 By Mouth Qd  Allergies (verified): 1)  ! Codeine 2)  ! Asa 3)  ! * Mtc 4)  Tramadol Hcl (Tramadol Hcl)  Past History:  Past Medical History: Last updated: 01/15/2008 Coronary artery disease Hypertension Hypothyroidism cervical spine disc disease  Social History: Last updated: 07/21/2008 Retired Married Former Smoker Regular exercise-yes at [employer]  Review of Systems  The patient denies fever, chest pain, dyspnea on exertion, abdominal pain, and melena.    Physical Exam  General:  Well-developed,well-nourished,in no acute distress; alert,appropriate and  cooperative throughout examination Nose:  External nasal examination shows no deformity or inflammation. Nasal mucosa are pink and moist without lesions or exudates. Mouth:  Oral mucosa and oropharynx without lesions or exudates.  Teeth in good repair. Neck:  No deformities, masses, or tenderness noted. Lungs:  Normal respiratory effort, chest expands symmetrically. Lungs are clear to auscultation, no crackles or wheezes. Heart:  Normal rate and regular rhythm. S1 and S2 normal without gallop, murmur, click, rub or other extra sounds. Abdomen:  Bowel sounds positive,abdomen soft and non-tender without masses, organomegaly or hernias noted. No HSM Msk:  No deformity or scoliosis noted of thoracic or lumbar spine.   Extremities:  No clubbing, cyanosis, edema, or deformity noted with normal full range of motion of all joints.   Neurologic:  No cranial nerve deficits noted. Station and gait are normal. Plantar reflexes are down-going bilaterally. DTRs are symmetrical throughout. Sensory, motor and coordinative functions appear intact. Skin:  Small papules; rosacea Psych:  Cognition and judgment appear intact. Alert and cooperative with normal attention span and concentration. No apparent delusions, illusions, hallucinations   Impression & Recommendations:  Problem # 1:  LIVER FUNCTION TESTS, ABNORMAL, HX OF (ICD-V12.2) Assessment New  Hold Crestor Liver US The labs were reviewed with the patient.   Orders: Radiology Referral (Radiology)  Problem # 2:  RENAL FAILURE, CHRONIC (ICD-585.9) Assessment:  Improved The labs were reviewed with the patient.   Problem # 3:  HYPERTENSION (ICD-401.9) Assessment: Unchanged  Her updated medication list for this problem includes:    Toprol Xl 25 Mg Xr24h-tab (Metoprolol succinate) .Marland KitchenMarland KitchenMarland KitchenMarland Kitchen 3 once daily    Cozaar 100 Mg Tabs (Losartan potassium) .[name]... 1 by mouth qd  Problem # 4:  CORONARY ARTERY DISEASE (ICD-414.00) Assessment: Unchanged  Her updated  medication list for this problem includes:    Toprol Xl 25 Mg Xr24h-tab (Metoprolol succinate) .Marland KitchenMarland KitchenMarland KitchenMarland Kitchen 3 once daily    Plavix 75 Mg Tabs (Clopidogrel bisulfate) ..... Once daily    Cozaar 100 Mg Tabs (Losartan potassium) .[name]... 1 by mouth qd  Problem # 5:  SKIN RASH (ICD-782.1) Assessment: Comment Only  Orders: Dermatology Referral (Derma) per pt's request to check a few things  Complete Medication List: 1)  Crestor 40 Mg Tabs (Rosuvastatin calcium) .[name].. 1 by mouth qd 2)  Toprol Xl 25 Mg Xr24h-tab (Metoprolol succinate) .... 3 once daily 3)  Plavix 75 Mg Tabs (Clopidogrel bisulfate) .... Once daily 4)  Vitamin D3 1000 Unit Tabs (Cholecalciferol) .[name].. 1 qd 5)  Vitamin-b Complex Tabs (B complex vitamins) .[name].. 1 qd 6)  Flonase 50 Mcg/act Susp (Fluticasone propionate) .... 2 sprays each day 7)  Tussionex Pennkinetic Er 8-10 Mg/17ml Lqcr (Chlorpheniramine-hydrocodone) .[name].. 1 tsp by mouth two times a day as needed cough 8)  Advair Diskus 100-50 Mcg/dose Misc (Fluticasone-salmeterol) .[name].. 1 puff 2 times daily 9)  Erythromycin 5 Mg/gm Oint (erythromycin)  .... Use two times a day on eyelids 10)  Levoxyl 88 Mcg Tabs (Levothyroxine sodium) .[name].. 1 by mouth qd 11)  Cozaar 100 Mg Tabs (Losartan potassium) .[name].. 1 by mouth qd  Patient Instructions: 1)  Stop Crestor for now 2)  Labs in 1 month 3)  Lipid Panel prior to visit, ICD-9: 4)  Hepatic Panel prior to visit, ICD-9: 5)  Please schedule a follow-up appointment in 4 months.

## 2010-05-31 NOTE — Assessment & Plan Note (Signed)
Summary: 6 mth fu/$50/jss   Vital Signs:  Patient profile:   70 year old female Weight:      173 pounds BMI:     30.76 Temp:     97.2 degrees F oral Pulse rate:   73 / minute BP sitting:   124 / 88  (left arm)  Vitals Entered By: Doralee Albino (May 18, 2009 4:35 PM) CC: f/u Is Patient Diabetic? No   CC:  f/u.  History of Present Illness: The patient presents for a follow up of hypertension, CAD, hyperlipidemia   Preventive Screening-Counseling & Management  Alcohol-Tobacco     Smoking Status: quit  Current Medications (verified): 1)  Crestor 40 Mg Tabs (Rosuvastatin Calcium) .[name].. 1 By Mouth Qd 2)  Toprol Xl 25 Mg Xr24h-Tab (Metoprolol Succinate) .... 3 Once Daily 3)  Plavix 75 Mg  Tabs (Clopidogrel Bisulfate) .... Once Daily 4)  Vitamin D3 1000 Unit  Tabs (Cholecalciferol) .[name].. 1 Qd 5)  Vitamin-B Complex   Tabs (B Complex Vitamins) .[name].. 1 Qd 6)  Augmentin 875-125 Mg Tabs (Amoxicillin-Pot Clavulanate) .[name].. 1 By Mouth Three Times A Day As Needed Fever 7)  Flonase 50 Mcg/act Susp (Fluticasone Propionate) .... 2 Sprays Each Day 8)  Tussionex Pennkinetic Er 8-10 Mg/59ml Lqcr (Chlorpheniramine-Hydrocodone) .[name].. 1 Tsp By Mouth Two Times A Day As Needed Cough 9)  Prednisone 10 Mg  Tabs (Prednisone) .... Take 40mg  Qd For 3 Days, Then 20 Mg Qd For 3 Days, Then 10mg  Qd For 6 Days, Then Stop. Take Pc. 10)  Advair Diskus 100-50 Mcg/dose Misc (Fluticasone-Salmeterol) .[name].. 1 Puff 2 Times Daily 11)  Erythromycin 5 Mg/gm Oint (Erythromycin) .... Use Two Times A Day On Eyelids 12)  Levoxyl 88 Mcg Tabs (Levothyroxine Sodium) .[name].. 1 By Mouth Qd 13)  Micardis 80 Mg Tabs (Telmisartan) .[name].. 1 By Mouth Daily  Allergies: 1)  ! Codeine 2)  ! Asa 3)  ! * Mtc 4)  Tramadol Hcl (Tramadol Hcl)  Past History:  Past Medical History: Last updated: 01/15/2008 Coronary artery disease Hypertension Hypothyroidism cervical spine disc disease  Past Surgical History: Last updated:  05/27/2007 Coronary artery bypass graft Splenectomy Cardiac cath C spine plate 2008 Dr Saintclair Halsted  Physical Exam  General:  Well-developed,well-nourished,in no acute distress; alert,appropriate and cooperative throughout examination Nose:  External nasal examination shows no deformity or inflammation. Nasal mucosa are pink and moist without lesions or exudates. Mouth:  Oral mucosa and oropharynx without lesions or exudates.  Teeth in good repair. Lungs:  Normal respiratory effort, chest expands symmetrically. Lungs are clear to auscultation, no crackles or wheezes. Heart:  Normal rate and regular rhythm. S1 and S2 normal without gallop, murmur, click, rub or other extra sounds. Abdomen:  Bowel sounds positive,abdomen soft and non-tender without masses, organomegaly or hernias noted. Msk:  No deformity or scoliosis noted of thoracic or lumbar spine.   Neurologic:  No cranial nerve deficits noted. Station and gait are normal. Plantar reflexes are down-going bilaterally. DTRs are symmetrical throughout. Sensory, motor and coordinative functions appear intact. Skin:  rosacea Psych:  Cognition and judgment appear intact. Alert and cooperative with normal attention span and concentration. No apparent delusions, illusions, hallucinations   Impression & Recommendations:  Problem # 1:  HYPERTENSION (ICD-401.9)  The following medications were removed from the medication list:    Micardis 80 Mg Tabs (Telmisartan) .[name]... 1 by mouth daily Her updated medication list for this problem includes:    Toprol Xl 25 Mg Xr24h-tab (Metoprolol succinate) .Marland KitchenMarland KitchenMarland KitchenMarland Kitchen 3 once  daily    Cozaar 100 Mg Tabs (Losartan potassium) .[name]... 1 by mouth qd  Problem # 2:  HYPOTHYROIDISM (ICD-244.9)  Her updated medication list for this problem includes:    Levoxyl 88 Mcg Tabs (Levothyroxine sodium) .[name]... 1 by mouth qd  Problem # 3:  CORONARY ARTERY DISEASE (ICD-414.00)  The following medications were removed from the medication  list:    Micardis 80 Mg Tabs (Telmisartan) .[name]... 1 by mouth daily Her updated medication list for this problem includes:    Toprol Xl 25 Mg Xr24h-tab (Metoprolol succinate) .Marland KitchenMarland KitchenMarland KitchenMarland Kitchen 3 once daily    Plavix 75 Mg Tabs (Clopidogrel bisulfate) ..... Once daily    Cozaar 100 Mg Tabs (Losartan potassium) .[name]... 1 by mouth qd  Problem # 4:  HYPOTHYROIDISM (ICD-244.9)  Her updated medication list for this problem includes:    Levoxyl 88 Mcg Tabs (Levothyroxine sodium) .[name]... 1 by mouth qd  Complete Medication List: 1)  Crestor 40 Mg Tabs (Rosuvastatin calcium) .[name].. 1 by mouth qd 2)  Toprol Xl 25 Mg Xr24h-tab (Metoprolol succinate) .... 3 once daily 3)  Plavix 75 Mg Tabs (Clopidogrel bisulfate) .... Once daily 4)  Vitamin D3 1000 Unit Tabs (Cholecalciferol) .[name].. 1 qd 5)  Vitamin-b Complex Tabs (B complex vitamins) .[name].. 1 qd 6)  Augmentin 875-125 Mg Tabs (Amoxicillin-pot clavulanate) .[name].. 1 by mouth three times a day as needed fever 7)  Flonase 50 Mcg/act Susp (Fluticasone propionate) .... 2 sprays each day 8)  Tussionex Pennkinetic Er 8-10 Mg/85ml Lqcr (Chlorpheniramine-hydrocodone) .[name].. 1 tsp by mouth two times a day as needed cough 9)  Prednisone 10 Mg Tabs (Prednisone) .... Take 40mg  qd for 3 days, then 20 mg qd for 3 days, then 10mg  qd for 6 days, then stop. take pc. 10)  Advair Diskus 100-50 Mcg/dose Misc (Fluticasone-salmeterol) .[name].. 1 puff 2 times daily 11)  Erythromycin 5 Mg/gm Oint (erythromycin)  .... Use two times a day on eyelids 12)  Levoxyl 88 Mcg Tabs (Levothyroxine sodium) .[name].. 1 by mouth qd 13)  Cozaar 100 Mg Tabs (Losartan potassium) .[name].. 1 by mouth qd  Patient Instructions: 1)  Please schedule a follow-up appointment in 4 months. 2)  BMP prior to visit, ICD-9: 3)  Hepatic Panel prior to visit, ICD-9: 4)  Lipid Panel prior to visit, ICD-9:272.0  995.20 Prescriptions: COZAAR 100 MG TABS (LOSARTAN POTASSIUM) 1 by mouth qd  #90 x 3   Entered and Authorized by:   Cassandria Anger  MD   Signed by:   Cassandria Anger MD on 05/18/2009   Method used:   Print then Give to Patient   RxID:   [id number]

## 2010-05-31 NOTE — Letter (Signed)
Summary: [facility]   Imported By: Bubba Hales 06/03/2009 09:41:10  _____________________________________________________________________  External Attachment:    Type:   Image     Comment:   External Document

## 2010-05-31 NOTE — Letter (Signed)
Summary: [facility] Consult Scheduled Letter  [facility]   [place], [place] [address]   Phone: [phone]  Fax: [phone]      09/30/2009 MRN: [id number]  AIA HEDGES [address] [place], [place]  [address]    Dear Ms. Bergey,      We have scheduled an appointment for you.  At the recommendation of Dr.Plotnikov, we have scheduled you a consult with Dr Wilhemina Bonito on 11/29/09 at 8:45am.  Their phone number is [phone].  If this appointment day and time is not convenient for you, please feel free to call the office of the doctor you are being referred to at the number listed above and reschedule the appointment.     [facility] [id number] [place], [place] [address]    Thank you,  Patient Care Coordinator [facility]

## 2010-05-31 NOTE — Progress Notes (Signed)
Summary: CALL  Phone Note Call from Patient Call back at Home Phone [phone]   Summary of Call: Pt left vm that she was told to call MD back on Tuesday. She is req a call back.  Initial call taken by: Charlsie Quest, [place],  November 30, 2009 3:06 PM  Follow-up for Phone Call        pt is feeling much better Follow-up by: Cassandria Anger MD,  November 30, 2009 5:22 PM

## 2010-05-31 NOTE — Letter (Signed)
Summary: Hurley Cisco MD  Hurley Cisco MD   Imported By: Rise Patience 01/21/2010 14:34:47  _____________________________________________________________________  External Attachment:    Type:   Image     Comment:   External Document

## 2010-05-31 NOTE — Assessment & Plan Note (Signed)
Summary: knee pain--foot swelling--stc   Vital Signs:  Patient profile:   70 year old female Height:      63 inches Weight:      172 pounds BMI:     30.58 O2 Sat:      93 % on Room air Temp:     97.8 degrees F oral Pulse rate:   79 / minute Pulse rhythm:   regular Resp:     16 per minute BP sitting:   102 / 70  (left arm) Cuff size:   regular  Vitals Entered By: Jonathon Resides, CMA(AAMA) (November 26, 2009 2:53 PM)  O2 Flow:  Room air CC: Bilatertal knee pain/feet swelling Is Patient Diabetic? No   CC:  Bilatertal knee pain/feet swelling.  History of Present Illness: C/o knee pain B x months  She went to UC once C/o feet swelling C/o stiffness in am's x 2 hrs, pain in neck shoulders and legs x wks - severe at times, weakness.   Current Medications (verified): 1)  Crestor 40 Mg Tabs (Rosuvastatin Calcium) .[name].. 1 By Mouth Qd 2)  Toprol Xl 25 Mg Xr24h-Tab (Metoprolol Succinate) .... 3 Once Daily 3)  Plavix 75 Mg  Tabs (Clopidogrel Bisulfate) .... Once Daily 4)  Vitamin D3 1000 Unit  Tabs (Cholecalciferol) .[name].. 1 Qd 5)  Vitamin-B Complex   Tabs (B Complex Vitamins) .[name].. 1 Qd 6)  Flonase 50 Mcg/act Susp (Fluticasone Propionate) .... 2 Sprays Each Day 7)  Tussionex Pennkinetic Er 8-10 Mg/10ml Lqcr (Chlorpheniramine-Hydrocodone) .[name].. 1 Tsp By Mouth Two Times A Day As Needed Cough 8)  Advair Diskus 100-50 Mcg/dose Misc (Fluticasone-Salmeterol) .[name].. 1 Puff 2 Times Daily 9)  Erythromycin 5 Mg/gm Oint (Erythromycin) .... Use Two Times A Day On Eyelids 10)  Levoxyl 88 Mcg Tabs (Levothyroxine Sodium) .[name].. 1 By Mouth Qd 11)  Cozaar 100 Mg Tabs (Losartan Potassium) .[name].. 1 By Mouth Qd  Allergies (verified): 1)  ! Codeine 2)  ! Asa 3)  ! * Mtc 4)  Tramadol Hcl (Tramadol Hcl)  Past History:  Past Medical History: Last updated: 01/15/2008 Coronary artery disease Hypertension Hypothyroidism cervical spine disc disease  Social History: Last updated:  07/21/2008 Retired Married Former Smoker Regular exercise-yes at [employer]  Review of Systems  The patient denies fever, dyspnea on exertion, abdominal pain, and melena.    Physical Exam  General:  Well-developed,well-nourished,in no acute distress; alert,appropriate and cooperative throughout examination Head:  Normocephalic and atraumatic without obvious abnormalities. No apparent alopecia or balding. No pulsating arteries Ears:  External ear exam shows no significant lesions or deformities.  Otoscopic examination reveals clear canals, tympanic membranes are intact bilaterally without bulging, retraction, inflammation or discharge. Hearing is grossly normal bilaterally. Mouth:  Oral mucosa and oropharynx without lesions or exudates.  Teeth in good repair. Neck:  No deformities, masses, or tenderness noted. Lungs:  Normal respiratory effort, chest expands symmetrically. Lungs are clear to auscultation, no crackles or wheezes. Heart:  Normal rate and regular rhythm. S1 and S2 normal without gallop, murmur, click, rub or other extra sounds. Abdomen:  Bowel sounds positive,abdomen soft and non-tender without masses, organomegaly or hernias noted. No HSM Msk:  No deformity or scoliosis noted of thoracic or lumbar spine.  Cervical spine is tender to palpation over paraspinal muscles and with the ROM . B knees are tender w/ROM R 1st MTP is swollen and tender Extremities:  No clubbing, cyanosis, edema, or deformity noted with normal full range of motion of all joints.  Neurologic:  No cranial nerve deficits noted. Station and gait are normal. Plantar reflexes are down-going bilaterally. DTRs are symmetrical throughout. Sensory, motor and coordinative functions appear intact. Skin:  Small papules; rosacea Psych:  Cognition and judgment appear intact. Alert and cooperative with normal attention span and concentration. No apparent delusions, illusions, hallucinations   Impression &  Recommendations:  Problem # 1:  ARTHRALGIA (ICD-719.40) - poss PMR Assessment New Discussed - empiric steroids Orders: TLB-B12, Serum-Total ONLY [id number] TLB-CBC Platelet - w/Differential (85025-CBCD) TLB-BMP (Basic Metabolic Panel-BMET) (99991111) TLB-Hepatic/Liver Function Pnl (80076-HEPATIC) TLB-Uric Acid, Blood (84550-URIC) TLB-Sedimentation Rate (ESR) (85652-ESR) TLB-TSH (Thyroid Stimulating Hormone) (84443-TSH) TLB-CK Total Only(Creatine Kinase/CPK) (82550-CK)  Problem # 2:  PARESTHESIA (ICD-782.0) Assessment: New  Orders: TLB-B12, Serum-Total ONLY [id number] TLB-CBC Platelet - w/Differential (85025-CBCD) TLB-BMP (Basic Metabolic Panel-BMET) (99991111) TLB-Hepatic/Liver Function Pnl (80076-HEPATIC) TLB-Uric Acid, Blood (84550-URIC) TLB-Sedimentation Rate (ESR) (85652-ESR) TLB-TSH (Thyroid Stimulating Hormone) (84443-TSH)  Problem # 3:  STIFFNESS (ICD-719.50) Assessment: New As per #1  Problem # 4:  FOOT PAIN (ICD-729.5) R 1st MTP - poss gout Assessment: New Predn should help  Complete Medication List: 1)  Crestor 40 Mg Tabs (Rosuvastatin calcium) .[name].. 1 by mouth qd 2)  Toprol Xl 25 Mg Xr24h-tab (Metoprolol succinate) .... 3 once daily 3)  Plavix 75 Mg Tabs (Clopidogrel bisulfate) .... Once daily 4)  Vitamin D3 1000 Unit Tabs (Cholecalciferol) .[name].. 1 qd 5)  Vitamin-b Complex Tabs (B complex vitamins) .[name].. 1 qd 6)  Flonase 50 Mcg/act Susp (Fluticasone propionate) .... 2 sprays each day 7)  Tussionex Pennkinetic Er 8-10 Mg/46ml Lqcr (Chlorpheniramine-hydrocodone) .[name].. 1 tsp by mouth two times a day as needed cough 8)  Advair Diskus 100-50 Mcg/dose Misc (Fluticasone-salmeterol) .[name].. 1 puff 2 times daily 9)  Erythromycin 5 Mg/gm Oint (erythromycin)  .... Use two times a day on eyelids 10)  Levoxyl 88 Mcg Tabs (Levothyroxine sodium) .[name].. 1 by mouth qd 11)  Cozaar 100 Mg Tabs (Losartan potassium) .[name].. 1 by mouth qd 12)  Prednisone 10 Mg Tabs (Prednisone)  .... 1.5 tab once daily pc 13)  Tramadol Hcl 50 Mg Tabs (Tramadol hcl) .[name].. 1-2 tabs by mouth two times a day as needed pain  Patient Instructions: 1)  Please schedule a follow-up appointment in 1 month. Prescriptions: TRAMADOL HCL 50 MG TABS (TRAMADOL HCL) 1-2 tabs by mouth two times a day as needed pain  #120 x 3   Entered and Authorized by:   Cassandria Anger MD   Signed by:   Cassandria Anger MD on 11/26/2009   Method used:   Electronically to        [facility]. [phone]* (retail)       [place], [place]  [address]       Ph: [phone]       Fax: [phone]   RxID:   [id number] PREDNISONE 10 MG TABS (PREDNISONE) 1.5 tab once daily pc  #100 x 1   Entered and Authorized by:   Cassandria Anger MD   Signed by:   Cassandria Anger MD on 11/26/2009   Method used:   Electronically to        [facility]. [phone]* (retail)       [address]       [place], [place]  [address]       Ph: [phone]  Fax: [phone]   [id number]

## 2010-06-02 NOTE — Letter (Signed)
Summary: Hurley Cisco MD  Hurley Cisco MD   Imported By: Phillis Knack 04/20/2010 10:47:14  _____________________________________________________________________  External Attachment:    Type:   Image     Comment:   External Document

## 2010-06-09 ENCOUNTER — Encounter: Payer: Self-pay | Admitting: Internal Medicine

## 2010-06-21 ENCOUNTER — Encounter (INDEPENDENT_AMBULATORY_CARE_PROVIDER_SITE_OTHER): Payer: Self-pay | Admitting: *Deleted

## 2010-06-21 ENCOUNTER — Other Ambulatory Visit: Payer: Medicare Other

## 2010-06-21 ENCOUNTER — Other Ambulatory Visit: Payer: Self-pay | Admitting: Internal Medicine

## 2010-06-21 DIAGNOSIS — Z0000 Encounter for general adult medical examination without abnormal findings: Secondary | ICD-10-CM

## 2010-06-21 LAB — LIPID PANEL
LDL Cholesterol: 76 mg/dL (ref 0–99)
VLDL: 23.6 mg/dL (ref 0.0–40.0)

## 2010-06-21 LAB — BASIC METABOLIC PANEL
CO2: 31 mEq/L (ref 19–32)
Chloride: 106 mEq/L (ref 96–112)
Potassium: 4.4 mEq/L (ref 3.5–5.1)
Sodium: 144 mEq/L (ref 135–145)

## 2010-06-21 LAB — HEPATIC FUNCTION PANEL
ALT: 18 U/L (ref 0–35)
Alkaline Phosphatase: 50 U/L (ref 39–117)
Bilirubin, Direct: 0.2 mg/dL (ref 0.0–0.3)
Total Protein: 5.9 g/dL — ABNORMAL LOW (ref 6.0–8.3)

## 2010-06-21 LAB — URINALYSIS, ROUTINE W REFLEX MICROSCOPIC
Bilirubin Urine: NEGATIVE
Hgb urine dipstick: NEGATIVE
Total Protein, Urine: NEGATIVE
Urine Glucose: NEGATIVE
pH: 5.5 (ref 5.0–8.0)

## 2010-06-21 LAB — CBC WITH DIFFERENTIAL/PLATELET
Eosinophils Relative: 6 % — ABNORMAL HIGH (ref 0.0–5.0)
HCT: 36.5 % (ref 36.0–46.0)
Hemoglobin: 12.2 g/dL (ref 12.0–15.0)
Lymphs Abs: 2.8 10*3/uL (ref 0.7–4.0)
MCV: 105.7 fl — ABNORMAL HIGH (ref 78.0–100.0)
Monocytes Absolute: 1.1 10*3/uL — ABNORMAL HIGH (ref 0.1–1.0)
Monocytes Relative: 10.7 % (ref 3.0–12.0)
Neutro Abs: 5.8 10*3/uL (ref 1.4–7.7)
RDW: 17.7 % — ABNORMAL HIGH (ref 11.5–14.6)
WBC: 10.6 10*3/uL — ABNORMAL HIGH (ref 4.5–10.5)

## 2010-06-22 NOTE — Letter (Signed)
Summary: Lindaann Slough MD  Lindaann Slough MD   Imported By: Bubba Hales 06/17/2010 08:05:49  _____________________________________________________________________  External Attachment:    Type:   Image     Comment:   External Document

## 2010-06-22 NOTE — Letter (Signed)
Summary: [facility]   Imported By: Phillis Knack 06/15/2010 08:50:00  _____________________________________________________________________  External Attachment:    Type:   Image     Comment:   External Document

## 2010-06-23 ENCOUNTER — Encounter: Payer: Self-pay | Admitting: Internal Medicine

## 2010-06-23 ENCOUNTER — Ambulatory Visit (INDEPENDENT_AMBULATORY_CARE_PROVIDER_SITE_OTHER): Payer: Medicare Other | Admitting: Internal Medicine

## 2010-06-23 DIAGNOSIS — M069 Rheumatoid arthritis, unspecified: Secondary | ICD-10-CM | POA: Insufficient documentation

## 2010-06-23 DIAGNOSIS — Z23 Encounter for immunization: Secondary | ICD-10-CM

## 2010-06-23 DIAGNOSIS — Z0000 Encounter for general adult medical examination without abnormal findings: Secondary | ICD-10-CM

## 2010-06-30 HISTORY — PX: NM MYOVIEW LTD: HXRAD82

## 2010-07-07 ENCOUNTER — Telehealth: Payer: Self-pay | Admitting: Internal Medicine

## 2010-07-07 ENCOUNTER — Encounter: Payer: Self-pay | Admitting: Internal Medicine

## 2010-07-07 NOTE — Assessment & Plan Note (Signed)
Summary: 3 [facility]LL[facility] MEDICARE/#/CD   Vital Signs:  Patient profile:   70 year old female Height:      63 inches Weight:      165 pounds BMI:     29.33 Temp:     98.6 degrees F oral Pulse rate:   92 / minute Pulse rhythm:   regular Resp:     16 per minute BP sitting:   100 / 60  (left arm) Cuff size:   regular  Vitals Entered By: Jonathon Resides, Gabrielle Dare) (June 23, 2010 9:52 AM) CC: MWV Is Patient Diabetic? No   CC:  MWV.  History of Present Illness: The patient presents for a preventive health examination  Patient past medical history, social history, and family history reviewed in detail no significant changes.  Patient is physically active. Depression is negative and mood is good. Hearing is normal, and able to perform activities of daily living. Risk of falling is negligible and home safety has been reviewed and is appropriate. Patient has normal height, some overweight, and visual acuity is good w/glasses. Patient has been counseled on age-appropriate routine health concerns for screening and prevention. Education, counseling done. Cognition is nl.  Preventive Screening-Counseling & Management  Alcohol-Tobacco     Alcohol drinks/day: <1     Alcohol type: beer, wine     Smoking Status: quit     Year Quit: 1960  Caffeine-Diet-Exercise     Caffeine use/day: 2 cups     Diet Counseling: not indicated; diet is assessed to be healthy     Nutrition Referrals: no     Does Patient Exercise: yes     Type of exercise: Silver Sneakers     Times/week: 3     Depression Counseling: not indicated; screening negative for depression  Hep-HIV-STD-Contraception     Hepatitis Risk: no risk noted     Dental Visit-last 6 months yes     SBE monthly: no     Sun Exposure-Excessive: no  Safety-Violence-Falls     Seat Belt Use: yes     Helmet Use: n/a     Firearms in the Home: no firearms in the home     Smoke Detectors: yes     Violence in the Home: no risk noted     Sexual  Abuse: no     Fall Risk: none      Sexual History:  currently monogamous.    Current Medications (verified): 1)  Toprol Xl 25 Mg Xr24h-Tab (Metoprolol Succinate) .... 3 Once Daily 2)  Plavix 75 Mg  Tabs (Clopidogrel Bisulfate) .... Once Daily 3)  Vitamin D3 1000 Unit  Tabs (Cholecalciferol) .[name].. 1 Qd 4)  Vitamin-B Complex   Tabs (B Complex Vitamins) .[name].. 1 Qd 5)  Flonase 50 Mcg/act Susp (Fluticasone Propionate) .... 2 Sprays Each Day 6)  Advair Diskus 100-50 Mcg/dose Misc (Fluticasone-Salmeterol) .[name].. 1 Puff 2 Times Daily 7)  Levoxyl 88 Mcg Tabs (Levothyroxine Sodium) .[name].. 1 By Mouth Qd 8)  Cozaar 100 Mg Tabs (Losartan Potassium) .[name].. 1 By Mouth Qd 9)  Tramadol Hcl 50 Mg Tabs (Tramadol Hcl) .[name].. 1-2 Tabs By Mouth Two Times A Day As Needed Pain 10)  Prednisone 5 Mg Tabs (Prednisone) .... As Dirrected 11)  Rheumatrex 2.5 Mg Tabs (Methotrexate (Anti-Rheumatic)) .... 6 A Day Q 1 Wk 12)  Lipitor 40 Mg Tabs (Atorvastatin Calcium) .[name].. 1 By Mouth Qd  Allergies (verified): 1)  ! Codeine 2)  ! Asa 3)  ! * Mtc 4)  Tramadol  Hcl (Tramadol Hcl)  Past History:  Past Medical History: Coronary artery disease Hypertension Hypothyroidism cervical spine disc disease PMR 2011 RA 2011 Dr Charlestine Night Rheumatoid arthritis  Social History: Caffeine use/day:  2 cups Dental Care w/in 6 mos.:  yes Sun Exposure-Excessive:  no Seat Belt Use:  yes Fall Risk:  none Hepatitis Risk:  no risk noted Sexual History:  currently monogamous  Review of Systems  The patient denies anorexia, fever, weight loss, weight gain, vision loss, decreased hearing, hoarseness, chest pain, syncope, dyspnea on exertion, peripheral edema, prolonged cough, headaches, hemoptysis, abdominal pain, melena, hematochezia, severe indigestion/heartburn, hematuria, incontinence, genital sores, muscle weakness, suspicious skin lesions, transient blindness, difficulty walking, depression, unusual weight change, abnormal bleeding, enlarged  lymph nodes, angioedema, and breast masses.         Lost wt on diet  Physical Exam  General:  Well-developed,well-nourished,in no acute distress; alert,appropriate and cooperative throughout examination Head:  Normocephalic and atraumatic without obvious abnormalities. No apparent alopecia or balding. No pulsating arteries Eyes:  Nl B eyelids Ears:  External ear exam shows no significant lesions or deformities.  Otoscopic examination reveals clear canals, tympanic membranes are intact bilaterally without bulging, retraction, inflammation or discharge. Hearing is grossly normal bilaterally. Nose:  External nasal examination shows no deformity or inflammation. Nasal mucosa are pink and moist without lesions or exudates. Mouth:  Oral mucosa and oropharynx without lesions or exudates.  Teeth in good repair. Neck:  No deformities, masses, or tenderness noted. Lungs:  Normal respiratory effort, chest expands symmetrically. Lungs are clear to auscultation, no crackles or wheezes. Heart:  Normal rate and regular rhythm. S1 and S2 normal without gallop, murmur, click, rub or other extra sounds. Abdomen:  Bowel sounds positive,abdomen soft and non-tender without masses, organomegaly or hernias noted. No HSM Msk:  No deformity or scoliosis noted of thoracic or lumbar spine, NT.  Cervical spine is not  tender to palpation over paraspinal muscles and with the ROM . B knees are not  tender w/ROM  Extremities:  No clubbing, cyanosis, edema, or deformity noted with normal full range of motion of all joints.   Neurologic:  No cranial nerve deficits noted. Station and gait are normal. Plantar reflexes are down-going bilaterally. DTRs are symmetrical throughout. Sensory, motor and coordinative functions appear intact. Skin:  Small facial papules; rosacea Cervical Nodes:  No lymphadenopathy noted Psych:  Cognition and judgment appear intact. Alert and cooperative with normal attention span and concentration. No  apparent delusions, illusions, hallucinations   Impression & Recommendations:  Problem # 1:  HEALTH MAINTENANCE EXAM (ICD-V70.0) Assessment New  Health and age related issues were discussed. Available screening tests and vaccinations were discussed as well. Healthy life style including good diet and exercise was discussed.  Mammo/colon up to date The labs were reviewed with the patient.   Orders: Medicare -1st Annual Wellness Visit [phone])  Problem # 2:  CORONARY ARTERY DISEASE (ICD-414.00) Assessment: Unchanged CL is pending in 4/12 The following medications were removed from the medication list:    Cozaar 100 Mg Tabs (Losartan potassium) .[name]... 1 by mouth qd Her updated medication list for this problem includes:    Toprol Xl 25 Mg Xr24h-tab (Metoprolol succinate) .[name]... 1 once daily    Plavix 75 Mg Tabs (Clopidogrel bisulfate) ..... Once daily    Cozaar 50 Mg Tabs (Losartan potassium) .[name]... 1 by mouth qd  Problem # 3:  HYPERTENSION (ICD-401.9) Assessment: Improved  The following medications were removed from the medication list:  Cozaar 100 Mg Tabs (Losartan potassium) .[name]... 1 by mouth qd Her updated medication list for this problem includes:    Toprol Xl 25 Mg Xr24h-tab (Metoprolol succinate) .[name]... 1 once daily    Cozaar 50 Mg Tabs (Losartan potassium) .[name]... 1 by mouth qd  Problem # 4:  ASPLENIA (ICD-759.0) Assessment: Comment Only  Abx and vaccines discussed  Problem # 5:  RHEUMATOID ARTHRITIS (ICD-714.0) Assessment: Improved ask Rheum  about folic acid  Complete Medication List: 1)  Toprol Xl 25 Mg Xr24h-tab (Metoprolol succinate) .[name].. 1 once daily 2)  Plavix 75 Mg Tabs (Clopidogrel bisulfate) .... Once daily 3)  Vitamin D3 1000 Unit Tabs (Cholecalciferol) .[name].. 1 qd 4)  Vitamin-b Complex Tabs (B complex vitamins) .[name].. 1 qd 5)  Flonase 50 Mcg/act Susp (Fluticasone propionate) .... 2 sprays each day 6)  Advair Diskus 100-50 Mcg/dose Misc (Fluticasone-salmeterol)  .[name].. 1 puff 2 times daily 7)  Levoxyl 88 Mcg Tabs (Levothyroxine sodium) .[name].. 1 by mouth qd 8)  Tramadol Hcl 50 Mg Tabs (Tramadol hcl) .[name].. 1-2 tabs by mouth two times a day as needed pain 9)  Prednisone 5 Mg Tabs (Prednisone) .... As dirrected 10)  Rheumatrex 2.5 Mg Tabs (Methotrexate (anti-rheumatic)) .... 6 a day q 1 wk 11)  Lipitor 20 Mg Tabs (Atorvastatin calcium) .[name].. 1 by mouth once daily for cholesterol 12)  Cozaar 50 Mg Tabs (Losartan potassium) .[name].. 1 by mouth qd  Other Orders: Pneumococcal Vaccine [id number]) Admin 1st Vaccine [id number])  Patient Instructions: 1)  Please schedule a follow-up appointment in 4 months. Prescriptions: COZAAR 50 MG TABS (LOSARTAN POTASSIUM) 1 by mouth qd  #90 x 3   Entered and Authorized by:   Cassandria Anger MD   Signed by:   Cassandria Anger MD on 06/23/2010   Method used:   Print then Give to Patient   RxID:   [id number] LIPITOR 20 MG TABS (ATORVASTATIN CALCIUM) 1 by mouth once daily for cholesterol  #90 x 3   Entered and Authorized by:   Cassandria Anger MD   Signed by:   Cassandria Anger MD on 06/23/2010   Method used:   Print then Give to Patient   RxID:   [id number] LEVOXYL 68 MCG TABS (LEVOTHYROXINE SODIUM) 1 by mouth qd  #90 x 3   Entered and Authorized by:   Cassandria Anger MD   Signed by:   Cassandria Anger MD on 06/23/2010   Method used:   Print then Give to Patient   RxID:   [id number] PLAVIX 79 MG  TABS (CLOPIDOGREL BISULFATE) once daily  #90 x 3   Entered and Authorized by:   Cassandria Anger MD   Signed by:   Cassandria Anger MD on 06/23/2010   Method used:   Print then Give to Patient   RxID:   [id number] TOPROL XL 25 MG XR24H-TAB (METOPROLOL SUCCINATE) 1 once daily  #90 x 3   Entered and Authorized by:   Cassandria Anger MD   Signed by:   Cassandria Anger MD on 06/23/2010   Method used:   Print then Give to Patient   RxID:   [id number]    Orders Added: 1)   Pneumococcal Vaccine [id number] 2)  Admin 1st Vaccine [id number])  Medicare -1st Annual Wellness Visit [id number]   Immunizations Administered:  Pneumonia Vaccine:    Vaccine Type: Pneumovax    Site: right deltoid    Mfr: Merck    Dose: 0.5 ml  Route: IM    Given by: Jonathon Resides, CMA(AAMA)    Exp. Date: 11/25/2011    Lot #: [id number]    VIS given: 04/05/09 version given June 23, 2010.   Immunizations Administered:  Pneumonia Vaccine:    Vaccine Type: Pneumovax    Site: right deltoid    Mfr: Merck    Dose: 0.5 ml    Route: IM    Given by: Jonathon Resides, CMA(AAMA)    Exp. Date: 11/25/2011    Lot #: [id number]    VIS given: 04/05/09 version given June 23, 2010.

## 2010-07-19 NOTE — Letter (Signed)
Summary: Surgical Clearance/[facility]  Surgical Clearance/[facility]   Imported By: Phillis Knack 07/14/2010 08:26:10  _____________________________________________________________________  External Attachment:    Type:   Image     Comment:   External Document

## 2010-07-19 NOTE — Progress Notes (Signed)
Summary: STOP PLAVIX?   Phone Note Call from Patient   Caller: Patient Summary of Call: FYI....Marland KitchenMarland KitchenPatient called stating that her oral surgeon office will be faxing forms to be completed for clearence. Patient would like this done soon as possible. Initial call taken by: Estell Harpin CMA,  July 07, 2010 4:42 PM  Follow-up for Phone Call        I called pt and advised forms have not been rec yet. She will contact her oral surgeon to have them re-fax forms to be completed. Follow-up by: Jonathon Resides, [facility]),  July 08, 2010 9:22 AM  Additional Follow-up for Phone Call Additional follow up Details #1::        Rec papers from surgeons office. Gave to Dr.Jak Haggar(in red Folder) to review. Additional Follow-up by: Jonathon Resides, [facility]),  July 08, 2010 4:39 PM    Additional Follow-up for Phone Call Additional follow up Details #2::    I did not see it... Follow-up by: Cassandria Anger MD,  July 09, 2010 9:43 AM  Additional Follow-up for Phone Call Additional follow up Details #3:: Details for Additional Follow-up Action Taken: Judson Roch from Dr Clovis Riley [id number] - Pt is scheduled for tooth extraction and bone graft tomorrow, usually minimal blood loss. Ok for pt to have this tomorrow? Or does pt need to stop plavix....................Marland KitchenCharlsie Quest, CMA  July 11, 2010 4:45 PM  Ok to have procedure tomorrow per MD, does not need to stop plavix. Office closed, will call tomorrow, pt is aware................Marland KitchenCharlsie Quest, CMA  July 11, 2010 5:15 PM  Dental office aware....................Marland KitchenCharlsie Quest, CMA  July 12, 2010 9:05 AM   Per Dr. Alain Marion I advised Sarah at Dr. Georgina Snell office ok for pt to hold Plavix for procedure. Additional Follow-up by: Jonathon Resides, [facility]),  July 12, 2010 9:11 AM

## 2010-09-13 NOTE — Assessment & Plan Note (Signed)
OFFICE VISIT   [name], [name]  DOB:  1940/12/17                                       01/17/2010  [id number]   The patient is a very pleasant 70 year old female that I inherited from  Dr. Amedeo Plenty.  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 Leafy Kindle in 1996 for rupture.  She has been  followed with MR angiogram since that time to evaluate her mesenteric  and splenic aneurysms.  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  She has had no changes since we last saw her.   PHYSICAL EXAMINATION:  Heart rate  96, blood pressure 107/69,  respiratory rate 28.  General:  She is well-appearing, in no distress.  Respirations nonlabored.  Abdomen:  Soft, nontender.  Extremities:  Warm  and well perfused.   DIAGNOSTICS:  I have reviewed her MRA which shows no significant change  from a prior study 2 years ago.   ASSESSMENT/PLAN:  Splenic aneurysms.  The patient appears to be being  treated with steroids.  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  She is being treated by  rheumatology on chronic steroids.  She had no significant changes.  I  would like to see her back in 2 years to continue Dr. Doristine Locks  follow-up  protocol.  I am going to get a CT scan instead of MRA to get better  imaging of her aneurysms.     Eldridge Abrahams, MD  Electronically Signed   VWB/MEDQ  D:  01/17/2010  T:  01/18/2010  Job:  [id number]   cc:   [name]. Plotnikov, MD  Jeanella Craze. Little, M.D.

## 2010-09-13 NOTE — H&P (Signed)
NAMESAVER[place], [place]                ACCOUNT NO.:  [id number]   MEDICAL RECORD NO.:  [id number]          PATIENT TYPE:  INP   LOCATION:  2106                         FACILITY:  [facility]   PHYSICIAN:  Kary Kos, M.D.        DATE OF BIRTH:  [birth year]   DATE OF ADMISSION:  11/29/2006  DATE OF DISCHARGE:                              HISTORY & PHYSICAL   REASON FOR ADMISSION:  Right-sided C3-4 facet fracture, with minimal  subluxation and cervical spinal instability.   HISTORY OF PRESENT ILLNESS:  The patient is a pleasant 70 year old  female who had construction done on her house recently.  As she was  walking around through some flooring that had not been put down, she  fell down the steps onto her backside -- ultimately striking her head on  the basement floor, injuring her head, obtaining a laceration as well as  her neck and her back.  She was brought into the emergency room to be  evaluated.  In the ER the patient was noted to be awake and alert, with  stable vital signs.  She was complaining of neck pain with a laceration,  that was repaired by the ER.  A CT scan of the head was normal.  A CT  scan of the neck revealed a C3-4 facet fracture.  Neurosurgery was  consulted.   PAST MEDICAL HISTORY:  1. Status post coronary artery bypass grafting.  2. 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  3. Hypertension.  4. Hypercholesterolemia.   SOCIAL HISTORY:  Nonsmoker and non-drinker.   MEDICATION ALLERGIES:  ASPIRIN, NITROGLYCERIN (allergy to this is just  hypotension).   MEDICATIONS:  Include:  Benicar, Crestor, Darvocet, levothyroxine,  __________ Met-Rx, Plavix, Toprol, Vytorin.  All of these medication  dosages will be obtained from the patient's family.   PHYSICAL EXAMINATION:  The patient is an awake and alert 70 year old  female, in no acute distress.  HEENT:  Within normal limits.  Pupils are equal, round and reactive to  light.  Facial examination and cranial nerve  examination is normal.  Strength is 5/5 of upper and lower extremities, with deltoid, biceps,  triceps, wrist flexion, hand flexion.  Lower extremity reveals 5/5  iliopsoas, quadriceps, hamstrings, gastroc, and EHL.  Sensation is  normal to light touch, and her neck is very tender to palpation  (especially around C3-4).  Her back is also tender in the mid-thoracic  spine.   Mid-thoracic plain x-rays were unremarkable to my eye.  Will await the  radiology report; however, will have to obtain a CT scan of her chest  for further evaluation.  I am also going to obtain an MRI scan of her  neck, evaluate the vertebral arteries with an MRA.  Due to the extent of  ligamentous injury, the patient will probably require stabilization.  Would favor an anterior cervical due to the location at C3-4; this is  unilateral.  However, the left side appears to be intact; but due to the  subluxation I do think this is going to require surgery.  But, the fact  that she is on  Plavix, this may have to be delayed 3-4 days to the first of the week --  to give her time enough off of Plavix.  Will notify her medical doctor,  Dr. [name]. Plotnikov, of her admission.  Also will obtain a cardiac  clearance from Dr. Rex Kras.           ______________________________  Kary Kos, M.D.     GC/MEDQ  D:  11/29/2006  T:  11/29/2006  Job:  [id number]

## 2010-09-13 NOTE — Assessment & Plan Note (Signed)
OFFICE VISIT   [name], [name]  DOB:  Jan 31, 1941                                       01/16/2008  [id number]   The patient returns for her biyearly followup.  She has multiple  splanchnic aneurysms.  Required an emergency splenectomy in 1996.  She  underwent an MR angiogram at this time which does reveal a stenosis of  the celiac artery, a small superior mesenteric artery and a patent  inferior mesenteric artery.  No change in imaging.  No new aneurysms  identified.   Since last seen the patient has had a neck fracture requiring anterior  fusion by Dr. Saintclair Halsted.   CURRENT MEDICATIONS:  Include Benicar, Crestor, tramadol, Levoxyl,  Plavix, metoprolol, multivitamin and Fibertab.   The patient appears well.  Alert and oriented, no distress.  BP is  109/68, pulse 74 per minute.  Abdomen:  Soft, nontender.  No masses or  organomegaly.  No abdominal bruits.  2+ femoral pulses bilaterally.   Overall doing well without progression of splanchnic aneurysms.   PLAN:  Follow up again in 2 years with repeat MR angiogram.   [name], M.D.  Electronically Signed   PGH/MEDQ  D:  01/16/2008  T:  01/18/2008  Job:  [id number]   cc:   Sherry Lacks. Plotnikov, MD  [name], M.D.

## 2010-09-13 NOTE — Discharge Summary (Signed)
[name], [name]                ACCOUNT NO.:  [id number]   MEDICAL RECORD NO.:  [id number]          PATIENT TYPE:  INP   LOCATION:  3310                         FACILITY:  [facility]   PHYSICIAN:  Kary Kos, M.D.        DATE OF BIRTH:  1940-08-21   DATE OF ADMISSION:  11/29/2006  DATE OF DISCHARGE:  12/09/2006                               DISCHARGE SUMMARY   ADMISSION DIAGNOSES:  C-spine fracture at C3-4 with jumped burst facet  and fractured facet on one side with minimal subluxation   DISCHARGE DIAGNOSES:  C-spine fracture at C3-4 with jumped burst facet  and fractured facet on one side with minimal subluxation   PROCEDURES:  Anterior cervical diskectomy and fusion at C3-4 and C4-5.   HISTORY OF PRESENT ILLNESS:  The patient is a 70 year old female who was  admitted through the emergency room after falling down the steps and  striking her head on the basement floor.  The patient denied any loss of  consciousness.  Neurologically, the patient was intact with 5/5  strength, normal and symmetric reflexes and sensation.  CT scan of the  neck showed a C3-4 right-sided facet fracture with jumped facet as well  some disruption in disk and bone spur at C4-5.  The patient was  stabilized emergently in the emergency room.  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  The patient was placed on Plavix for the  next couple of days and allowed this to stabilized.  Neurologically, the  patient remained intact.  The patient was subsequently able to be taken  to the operating room on hospital day 6.  She went to the OR for  cervical diskectomy and fusion C3-4 and C4-5.   Postoperatively, the patient did very well in the recovery room and  taken to the floor.  On the floor over the next 24 hours, the patient  did very well.  However, the next morning the patient was complaining of  difficulty swallowing and difficulty breathing.  On evaluation, the  patient's neck appeared to be soft  with no evidence of soft tissue  hematoma.  However, the patient was markedly dyspneic.  This was treated  with IV Decadron, pulmonary toilet, and aggressive nebulizations.  CT  scan of her neck showed extensive soft tissue edema with minimal  hematoma.  After several hours of managing and observation, the  breathing improved.  The steroids received opened up her airway, and she  was doing very well a couple of hours later, and overnight the patient's  difficulty breathing and swelling resolved.  The patient was able to be  discharged home on hospital day #11 neurologically intact, tolerating a  regular diet, breathing very easily and maintaining room air  saturations.  The patient was discharged on home oxygen and the tapering  steroid pack.  The patient was scheduled for followup in approximately 2  weeks with neurosurgery.           ______________________________  Kary Kos, M.D.     GC/MEDQ  D:  01/16/2007  T:  01/16/2007  Job:  [id number] 

## 2010-09-13 NOTE — Op Note (Signed)
[name], [name]                ACCOUNT NO.:  [id number]   MEDICAL RECORD NO.:  [id number]          PATIENT TYPE:  INP   LOCATION:  3310                         FACILITY:  [facility]   PHYSICIAN:  Kary Kos, M.D.        DATE OF BIRTH:  05-09-1940   DATE OF PROCEDURE:  12/04/2006  DATE OF DISCHARGE:                               OPERATIVE REPORT   PREOPERATIVE DIAGNOSIS:  C3-4 facet fracture, cervical instability, C3-  4, and cervical spondylosis, C4-5, with spinal cord compression and  cervical stenosis.   PROCEDURE:  Anterior cervical diskectomies and fusion at C3-4 and C4-5  using 7-mm allograft wedge at C3-4 and a 6-mm allograft wedge at C4-5, a  42-mm Atlantis Venture plate with 4 fixed-angled 30-mm screws and 2  fixed-angled 15 variable-angle screws.   SURGEON:  Kary Kos, M.D.   ANESTHESIA:  General endotracheal.   HISTORY OF PRESENT ILLNESS:  The patient is a very pleasant 70 year old  female who was admitted through the emergency room after sustaining a  fall down some steps with a C3-4 facet fracture and a rotary  subluxation.  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  The patient had been on Plavix before coming to the hospital;  this was stopped initially for 24 hours and then restarted because of  her artery injury and discontinued 48 hours prior to surgery and then  the patient was taken to the OR for surgery.  Risks and benefits of the  procedure were explained to the patient, who understood and agreed to  proceed forward.   DESCRIPTION OF PROCEDURE:  The patient was brought to the OR where she  was induced under general anesthesia.  The patient was placed supine  with the neck flexed in extension and 5 pounds of halter traction.  The  right side of her neck was prepped and draped in routine sterile  fashion.  Preoperative x-ray localized the C4-5 disk space.  A  curvilinear  incision was made just off the midline to the anterior  portion of the sternocleidomastoid.  The superficial layer of the  platysma was dissected out and divided longitudinally.  The avascular  plane the sternocleidomastoid muscle was developed down to the  prevertebral fascia.  The prevertebral fascia was dissected with  Kittners.  Intraoperative x-ray confirmed localization of the C4-5 disk  space level, then the longus colli was reflected laterally.  A self-  retaining retractor was placed, exposing that disk space and the one  above and at C3-4.  The C4-5 disk space had a marked collapse with a  large anterior calcified disk overlying the top and anterior osteophyte  at the C3-4 disk was noted to be partially splayed open with the C4-3  body partially displaced posteriorly.  After all the vertebral bodies  had been cleaned off, disk spaces were incised and I used a scalpel to  extend the annulotomy.  Both the disk spaces were drilled down to the  posterior annulus and posterior __________  complexes.  At this point,  __________  and draped __________ .  Working first at C3-4, the disk  space was cleaned out.  The disk was markedly torn and degenerated.  The  undersurfaces of both endplates were drilled off and this help bring C3-  4 back into partial alignment. The thecal sac was then identified  Both  C4 pedicles were identified to mark the lateral margins of the disk  space and then Gelfoam was placed.  Attention was taken to C4-5.  At C4-  5, there was noted to be a large posterior osteophyte coming off the C4  vertebral body and this is under-bitten with 1- and 2-mm Kerrison  punches, decompressing the central canal on both proximal C5 nerve  roots.  Both C5 pedicles were palpated.  Both C5 neural foramina were  opened up and decompressed and explored with an angled nerve hook.  At  the end of the diskectomies, both endplates were scraped with BA  curettes.  A 7-mm allograft wedge  was inserted at C3-4 and the 6-mm at  C4-5.  However, initially a 7-mm and C4-5 had been opened up; however,  after placement of the allograft at C3-4, this collapse down the 4-5  disk space and to avoid over-distraction, a smaller graft was then  opened up and inserted.  At this point, a 42-mm Venture plate was sized,  selected and then fixed-angled screws were drilled placed at C3 and C5  with 15-mm screws at C4, partially bringing the C4 body back into  alignment.  All screws had excellent purchase.  Locking mechanism was  engaged.  The wound was then copiously irrigated and meticulous  hemostasis was maintained.  A drain was placed due to the patient being  on Plavix preoperatively and the patient was awakened and sent to  recovery room in stable condition.  At the end of the case, sponge,  needle and instrument counts were correct.           ______________________________  Kary Kos, M.D.     GC/MEDQ  D:  12/04/2006  T:  12/05/2006  Job:  [id number]

## 2010-09-16 NOTE — Cardiovascular Report (Signed)
[name], [name]                          ACCOUNT NO.:  [id number]   MEDICAL RECORD NO.:  [id number]                   PATIENT TYPE:  OIB   LOCATION:  2899                                 FACILITY:  [facility]   PHYSICIAN:  Jeanella Craze. Little, M.D.              DATE OF BIRTH:  24-Feb-1941   DATE OF PROCEDURE:  DATE OF DISCHARGE:  01/01/2004                              CARDIAC CATHETERIZATION   INDICATIONS FOR TEST:  [name] is a 70 year old female who had bypass  surgery nine years earlier with a single left internal mammary artery to her  left anterior descending.  At that time, she had a 90% ostial narrowing of  her LAD.  She subsequently underwent cardiac catheterization in 2001 because  of atypical chest pain that revealed the left internal mammary artery to be  widely patent, and the area of ostial narrowing to be down to 60%.  She had  been given intracoronary nitroglycerin in the past, became profoundly  bradycardic and then hypotensive.  With this, she is considered allergic to  nitroglycerin particularly intracoronary.   She has had recurrence of somewhat atypical chest discomfort that occurs  with activity and at rest, and mild breathlessness with this.  Because of  this, she is brought to the catheterization laboratory as an outpatient for  left-heart catheterization and graft visualization.   DESCRIPTION OF PROCEDURE:  After obtaining informed consent, the patient was  prepped and draped in the usual sterile fashion, exposing the right groin.  With 1 mg of IV Versed and local anesthetic with 2% Xylocaine, the Seldinger  technique was employed, and a 5-French introducer sheath was placed into the  right femoral artery.  Left and right coronary arteriography, graft  visualization of the IMA graft, hand ventriculography and a distal aortogram  was then performed.   COMPLICATIONS:  None.   EQUIPMENT:  A 5-French Judkins configuration catheters.  In particular, the  left  internal mammary artery was easily cannulated with a 5-French right  coronary artery catheter.  Total contrast 115 cc.   RESULTS:  1.  Hemodynamic monitoring.  Central aortic pressure is 165/74.  Left      ventricular pressure is 164/11, and there is no significant valve      gradient noted at the time of pullback.  2.  Ventriculography.  Ventriculography in the RAO projection using 20 cc of      contrast at 12 cc per second revealed normal left ventricular systolic      function with an ejection fraction of greater than 60%.  The end-      diastolic pressure was 24.  No mitral regurgitation was seen.   DISTAL AORTOGRAM:  Distal aortogram done just above the level of the renal  arteries using 30 cc of contrast at 15 cc per second showed no evidence of  abdominal aortic aneurysm, and no evidence of iliac disease.  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   CORONARY ARTERIOGRAPHY:  1.  LEFT MAIN:  Normal.  2.  LAD:  The LAD extended down and crossed the apex of the heart.  The      ostial region had a 20-30% area of narrowing; but in multiple views,      there was no evidence of high-grade stenosis.  The first diagonal was      moderate size and free of disease.  3.  CIRCUMFLEX:  The circumflex gave rise to a large OM vessel that      bifurcated.  4.  RIGHT CORONARY ARTERY:  The right coronary artery gives rise to the PDA      and posterolateral branch, all of which are free of disease.  5.  LEFT INTERNAL MAMMARY ARTERY TO THE LAD:  The left internal mammary was      small with sequential 50 to 70% areas of narrowing throughout.  There      was very little flow into the LAD itself, and basically, the internal      mammary artery is nonfunctioning.   CONCLUSION:  1.  Normal left ventricular systolic function.  2.  Right renal artery, mild-to-moderate fibromuscular dysplasia.  3.  Loss of internal mammary artery to the LAD.  However, the ostial  lesion,      the LAD that nine years ago was 90% and four years ago, was only 60%,      now is only 30%.   I did not give her intracoronary nitroglycerin because of the above-  mentioned allergy.   PLAN:  We will treat her medically.  If she has worsening anginal-type pain,  would consider a Cardiolite study.  If anterior ischemia could be  documented, then I would consider passing a flow wire through the ostium of  the LAD to make sure there is not a pancake-type lesion that is not visible,  but in multiple views, it was not appreciated.  These films have been looked  at by the cardiologist.                                               Jeanella Craze. Little, M.D.    ABL/MEDQ  D:  01/01/2004  T:  01/03/2004  Job:  [id number]   cc:   [name]. Plotnikov, M.D. [facility]   Catheterization Laboratory

## 2010-09-16 NOTE — Cardiovascular Report (Signed)
. [facility]  Patient:    [name], [name]                       MRN: [id number] Proc. Date: 08/28/00 Adm. Date:  [id number] Attending:  Lawana Pai CC:         [name], M.D. [facility]  Cardiac Catheterization Laboratory   Cardiac Catheterization  PROCEDURES: 1. Left heart catheterization. 2. Selective right and left coronary arteriography. 3. Ventriculography. 4. Internal mammary artery graft visualization.  COMPLICATIONS:  None.  EQUIPMENT:  A 6 French Judkins configuration catheters.  The right coronary artery catheter was used to cannulate the internal mammary artery.  INDICATIONS FOR PROCEDURE:  [name] is a 70 year old female, who had bypass surgery with internal mammary artery to her LAD in 1996.  She subsequently had done well with no recurrent problems but a routine exercise test showed anterior apical ischemia, and because of this she is brought in for outpatient cardiac catheterization.  DESCRIPTION OF PROCEDURE:  The patient was prepped and draped in the usual sterile fashion exposing the right groin.  Following local anesthetic with 1% Xylocaine, the Seldinger technique was employed and a 6 [place] introducer sheath was placed into the right femoral artery.  Wire exchange technique was used to exchange catheters.  The right coronary catheter was used to cannulate the internal mammary artery.  Ventriculography in the RAO projection using 25 cc of contrast at 12 cc/sec. was also performed.  RESULTS: 1. Hemodynamic monitoring:  Central aortic pressure 159/71, left    ventricular pressure 155/16.  There was no significant aortic valve    gradient noted at the time of pullback. 2. Ventriculography:  Ventriculography in the RAO projection revealed    good opacification of the left ventricle, rare PVCs were noted.  There    is normal left ventricular systolic function.  The ejection fraction    calculated at 68%.  The  end-diastolic pressure was 16.  CORONARY ARTERIOGRAPHY:  No calcification was noted on fluoroscopy. 1. Left main:  Normal. 2. LAD.  At the ostial portion of the LAD there was a 50% eccentric    area of narrowing.  This had improved considerably from her 22    catheterization where she had a 90% ostial narrowing.  The distal    LAD across the apex of the heart gave rise to the first diagonal branch    and was free of other disease. 3. Circumflex:  The circumflex gave rise to one OM vessel with the branch    coming off of it.  This system was free of disease. 4. Right coronary artery:  The right coronary artery was smaller in    diameter than the other two vessels.  It supplied a small PDA and    posterolateral branch and this system was free of disease.  INTERNAL MAMMARY ARTERY TO THE LEFT ANTERIOR DESCENDING:  The internal mammary artery was subselectively engaged.  The proximal portion of the vessel had a less than 25% area of narrowing and the midportion was an eccentric 20-25% area of narrowing.  There was brisk distal flow down into the LAD and there was reflux of the contrast media ______ into the left main.  The terminal portion of the internal mammary artery was visualized via native circulation with bidirectional flow.  A flush shot of the internal mammary artery actually showed bidirectional flow occurring in the midportion of the internal mammary artery.  CONCLUSIONS: 1.  Patent internal mammary artery with two areas of minor narrowing in the    proximal and midportion.  In addition to this, there was a ostial area    of 60% narrowing in the left anterior descending. 2. Normal left ventricular systolic function. DD:  08/28/00 TD:  08/28/00 Job: [id number]

## 2010-09-16 NOTE — Op Note (Signed)
[facility]  Patient:    [name]                        MRN: [id number] Proc. Date: 05/17/99 Adm. Date:  [id number] Attending:  Perrin Smack                           Operative Report  PREOPERATIVE DIAGNOSIS:       Large polyp at os of the cervix.  POSTOPERATIVE DIAGNOSIS:      Endocervical polyp arising from lower uterine segment.  OPERATION:  SURGEON:                      Harrold Donath, M.D.  ASSISTANT:  ANESTHESIA:                   General anesthesia.  PACKS:                        None.  CATHETERS:                    None.  MEDIUM:                       Sorbitol.  Deficit 30 cc.  FINDINGS:                     Large polyp at the os attached in the uterine cavity. 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  ESTIMATED BLOOD LOSS:  DESCRIPTION OF PROCEDURE:     The patient was carried to the operating room after satisfactory general anesthesia.  She was placed in the lithotomy position and prepped and draped in the usual sterile fashion.  Examination revealed the uterus to be midplane.  No masses felt in the adnexa.  A weighted speculum was placed in the posterior vagina.  The cervix was grasped  with a tenaculum and we could see the polyp at the os.  I took a picture of this. I then dilated to a #23, tried to pass the observation scope, we could not get he scope in.  I dilated her to #25 and still could not get the scope in.  I then removed the polyp by grasping and turning, but I did not think I got all of the  polyp.  We then inserted the scope and I could see where it was attached and she had irregular endometrial cavity from submucous fibroids.  We then did a sharp curettage and I think I got the big part of the stalk. This came out as one piece and she had multiple fragments, but no thickened endometrium.  I then looked again and with no other abnormalities noted.  The procedure  was terminated.  The patient was carried to the recovery room in good condition. DD:  05/17/99 TD:  05/17/99 Job: [id number]

## 2010-09-22 ENCOUNTER — Encounter: Payer: Self-pay | Admitting: Gastroenterology

## 2010-09-22 ENCOUNTER — Other Ambulatory Visit (HOSPITAL_COMMUNITY): Payer: Self-pay | Admitting: Obstetrics

## 2010-09-22 DIAGNOSIS — Z1231 Encounter for screening mammogram for malignant neoplasm of breast: Secondary | ICD-10-CM

## 2010-10-18 ENCOUNTER — Encounter: Payer: Self-pay | Admitting: Internal Medicine

## 2010-10-19 ENCOUNTER — Encounter: Payer: Self-pay | Admitting: Internal Medicine

## 2010-10-20 ENCOUNTER — Ambulatory Visit (INDEPENDENT_AMBULATORY_CARE_PROVIDER_SITE_OTHER): Payer: Medicare Other | Admitting: Internal Medicine

## 2010-10-20 ENCOUNTER — Encounter: Payer: Self-pay | Admitting: Internal Medicine

## 2010-10-20 DIAGNOSIS — M069 Rheumatoid arthritis, unspecified: Secondary | ICD-10-CM

## 2010-10-20 DIAGNOSIS — E039 Hypothyroidism, unspecified: Secondary | ICD-10-CM

## 2010-10-20 NOTE — Progress Notes (Signed)
  Subjective:    Patient ID: [name], female    DOB: 1941-01-24, 70 y.o.   MRN: [id number]  HPI   The patient presents for a follow-up of  chronic hypertension, chronic dyslipidemia, CAD, RA controlled with medicines   Review of Systems  Constitutional: Positive for fatigue. Negative for chills, activity change, appetite change and unexpected weight change.  HENT: Negative for congestion, mouth sores and sinus pressure.   Eyes: Negative for visual disturbance.  Respiratory: Negative for cough and chest tightness.   Cardiovascular: Negative for chest pain.  Gastrointestinal: Negative for nausea and abdominal pain.  Genitourinary: Negative for frequency, difficulty urinating and vaginal pain.  Musculoskeletal: Positive for back pain and arthralgias. Negative for joint swelling and gait problem.  Skin: Negative for pallor and rash.  Neurological: Negative for dizziness, tremors, weakness, numbness and headaches.  Psychiatric/Behavioral: Negative for confusion and sleep disturbance.       Objective:   Physical Exam  Constitutional: She appears well-developed and well-nourished. No distress.  HENT:  Head: Normocephalic.  Right Ear: External ear normal.  Left Ear: External ear normal.  Nose: Nose normal.  Mouth/Throat: Oropharynx is clear and moist.  Eyes: Conjunctivae are normal. Pupils are equal, round, and reactive to light. Right eye exhibits no discharge. Left eye exhibits no discharge.  Neck: Normal range of motion. Neck supple. No JVD present. No tracheal deviation present. No thyromegaly present.  Cardiovascular: Normal rate, regular rhythm and normal heart sounds.   Pulmonary/Chest: No stridor. No respiratory distress. She has no wheezes.  Abdominal: Soft. Bowel sounds are normal. She exhibits no distension and no mass. There is no tenderness. There is no rebound and no guarding.  Musculoskeletal: She exhibits no edema and no tenderness.  Lymphadenopathy:    She has no  cervical adenopathy.  Neurological: She displays normal reflexes. No cranial nerve deficit. She exhibits normal muscle tone. Coordination normal.  Skin: No rash noted. No erythema.  Psychiatric: She has a normal mood and affect. Her behavior is normal. Judgment and thought content normal.        Wt Readings from Last 3 Encounters:  10/20/10 155 lb (70.308 kg)  06/23/10 165 lb (74.844 kg)  03/22/10 174 lb (78.926 kg)     Assessment & Plan:

## 2010-10-20 NOTE — Assessment & Plan Note (Signed)
On MTX now

## 2010-10-23 ENCOUNTER — Encounter: Payer: Self-pay | Admitting: Internal Medicine

## 2010-11-08 ENCOUNTER — Encounter: Payer: Self-pay | Admitting: Gastroenterology

## 2010-11-08 ENCOUNTER — Ambulatory Visit (INDEPENDENT_AMBULATORY_CARE_PROVIDER_SITE_OTHER): Payer: Medicare Other | Admitting: Gastroenterology

## 2010-11-08 DIAGNOSIS — Z1211 Encounter for screening for malignant neoplasm of colon: Secondary | ICD-10-CM

## 2010-11-08 DIAGNOSIS — Z7901 Long term (current) use of anticoagulants: Secondary | ICD-10-CM | POA: Insufficient documentation

## 2010-11-08 MED ORDER — PEG-KCL-NACL-NASULF-NA ASC-C 100 G PO SOLR: 1.0000 | Freq: Once | ORAL | Status: DC

## 2010-11-08 NOTE — Progress Notes (Signed)
History of Present Illness:  Mrs. Dobey is a pleasant 70 year old white female with history of coronary artery disease, chronic renal failure, rheumatoid arthritis, on methotrexate and Plavix, referred at the request of Dr. Alain Marion for screening colonoscopy. Her main GI complaint is intermittent diarrhea that occurs when traveling and often without warning. This may cause some urgency and incontinence. She denies rectal bleeding, abdominal pain or melena.    Review of Systems: She complains of joint pains. Pertinent positive and negative review of systems were noted in the above HPI section. All other review of systems were otherwise negative.    Current Medications, Allergies, Past Medical History, Past Surgical History, Family History and Social History were reviewed in [facility] record  Vital signs were reviewed in today's medical record. Physical Exam: General: Well developed , well nourished, no acute distress Head: Normocephalic and atraumatic Eyes:  sclerae anicteric, EOMI Ears: Normal auditory acuity Mouth: No deformity or lesions Lungs: Clear throughout to auscultation Heart: Regular rate and rhythm; no murmurs, rubs or bruits Abdomen: Soft, non tender and non distended. No masses, hepatosplenomegaly or hernias noted. Normal Bowel sounds Rectal:deferred Musculoskeletal: Symmetrical with no gross deformities  Pulses:  Normal pulses noted Extremities: No clubbing, cyanosis, edema or deformities noted Neurological: Alert oriented x 4, grossly nonfocal Psychological:  Alert and cooperative. Normal mood and affect

## 2010-11-08 NOTE — Assessment & Plan Note (Signed)
Screening colonoscopy will be scheduled. Plavix will be held if agreed upon by cardiology, Dr. Rex Kras.

## 2010-11-08 NOTE — Patient Instructions (Signed)
Colonoscopy A colonoscopy is an exam to evaluate your entire colon. In this exam, your colon is cleansed. A long fiberoptic tube is inserted through your rectum and into your colon. The fiberoptic scope (endoscope) is a long bundle of enclosed and very flexible fibers. These fibers transmit light to the area examined and send images from that area to your caregiver. Discomfort is usually minimal. You may be given a drug to help you sleep (sedative) during or prior to the procedure. This exam helps to detect lumps (tumors), polyps, inflammation, and areas of bleeding. Your caregiver may also take a small piece of tissue (biopsy) that will be examined under a microscope. BEFORE THE PROCEDURE  A clear liquid diet may be required for 2 days before the exam.   Liquid injections (enemas) or laxatives may be required.   A large amount of electrolyte solution may be given to you to drink over a short period of time. This solution is used to clean out your colon.   You should be present 1 prior to your procedure or as directed by your caregiver.   Check in at the admissions desk to fill out necessary forms if not preregistered. There will be consent forms to sign prior to the procedure. If accompanied by friends or family, there is a waiting area for them while you are having your procedure.  LET YOUR CAREGIVER KNOW ABOUT:  Allergies to food or medicine.  Medicines taken, including vitamins, herbs, eyedrops, over-the-counter medicines, and creams.   Use of steroids (by mouth or creams).   Previous problems with anesthetics or numbing medicines.   History of bleeding problems or blood clots.  Previous surgery.   Other health problems, including diabetes and kidney problems.   Possibility of pregnancy, if this applies.   AFTER THE PROCEDURE  If you received a sedative and/or pain medicine, you will need to arrange for someone to drive you home.   Occasionally, there is a little blood passed  with the first bowel movement. DO NOT be concerned.  HOME CARE INSTRUCTIONS  It is not unusual to pass moderate amounts of gas and experience mild abdominal cramping following the procedure. This is due to air being used to inflate your colon during the exam. Walking or a warm pack on your belly (abdomen) may help.   You may resume all normal meals and activities after sedatives and medicines have worn off.   Only take over-the-counter or prescription medicines for pain, discomfort, or fever as directed by your caregiver. DO NOT use aspirin or blood thinners if a biopsy was taken. Consult your caregiver for medicine usage if biopsies were taken.  FINDING OUT THE RESULTS OF YOUR TEST Not all test results are available during your visit. If your test results are not back during the visit, make an appointment with your caregiver to find out the results. Do not assume everything is normal if you have not heard from your caregiver or the medical facility. It is important for you to follow up on all of your test results. SEEK IMMEDIATE MEDICAL CARE IF:    You pass large blood clots or fill a toilet with blood following the procedure. This may also occur 10 to 14 days following the procedure. This is more likely if a biopsy was taken.   You develop abdominal pain that keeps getting worse and cannot be relieved with medicine.  Document Released: 04/14/2000 Document Re-Released: 07/12/2009 [facility] Patient Information 2011 [place]. Your Colonoscopy is scheduled for  12/27/2010 at [place] can pick up your MoviPrep from your pharmacy today

## 2010-11-09 ENCOUNTER — Encounter: Payer: Self-pay | Admitting: Gastroenterology

## 2010-11-10 ENCOUNTER — Ambulatory Visit (HOSPITAL_COMMUNITY)
Admission: RE | Admit: 2010-11-10 | Discharge: 2010-11-10 | Disposition: A | Discharge status: Home / Self Care | Payer: Medicare Other | Source: Ambulatory Visit | Attending: Obstetrics | Admitting: Obstetrics

## 2010-11-10 DIAGNOSIS — Z1231 Encounter for screening mammogram for malignant neoplasm of breast: Secondary | ICD-10-CM | POA: Insufficient documentation

## 2010-11-11 ENCOUNTER — Telehealth: Payer: Self-pay | Admitting: *Deleted

## 2010-11-11 NOTE — Telephone Encounter (Signed)
L/M WITH SPOUSE  FOR PT THAT SHE IS TO HOLD HER PLAVIX 7 DAYS PRIOR TO PROCEDURE. AND TO CALL IF SHE HAS ANY QUESTIONS

## 2010-11-14 NOTE — Telephone Encounter (Signed)
Pt aware ok to hold plavix prior to procedure

## 2010-12-20 ENCOUNTER — Telehealth: Payer: Self-pay | Admitting: Gastroenterology

## 2010-12-20 NOTE — Telephone Encounter (Signed)
error 

## 2010-12-27 ENCOUNTER — Encounter: Payer: Self-pay | Admitting: Gastroenterology

## 2010-12-27 ENCOUNTER — Ambulatory Visit (AMBULATORY_SURGERY_CENTER): Payer: Medicare Other | Admitting: Gastroenterology

## 2010-12-27 VITALS — BP 129/62 | HR 69 | Temp 97.1°F | Resp 16 | Ht 63.0 in | Wt 155.0 lb

## 2010-12-27 DIAGNOSIS — K5730 Diverticulosis of large intestine without perforation or abscess without bleeding: Secondary | ICD-10-CM

## 2010-12-27 DIAGNOSIS — Z1211 Encounter for screening for malignant neoplasm of colon: Secondary | ICD-10-CM

## 2010-12-27 DIAGNOSIS — D126 Benign neoplasm of colon, unspecified: Secondary | ICD-10-CM

## 2010-12-27 MED ORDER — SODIUM CHLORIDE 0.9 % IV SOLN: 500.0000 mL | INTRAVENOUS | Status: DC

## 2010-12-27 NOTE — Progress Notes (Signed)
Bandage to right forearm. Pt has poison oak all the way up right arm and on left forearm.  No oozing from blisters.

## 2010-12-27 NOTE — Patient Instructions (Addendum)
Resume plavix in 3 days (12/30/2010)  You may resume all other medications as you would normally take them.  Please refer to your blue and neon green sheets for instructions regarding diet and activity for the rest of today.  You will receive a letter in the mail in about two weeks regarding the results of the polyp biopsy.

## 2010-12-28 ENCOUNTER — Telehealth: Payer: Self-pay | Admitting: *Deleted

## 2010-12-28 NOTE — Telephone Encounter (Signed)

## 2011-02-04 ENCOUNTER — Other Ambulatory Visit: Payer: Self-pay | Admitting: Internal Medicine

## 2011-02-13 LAB — CBC
HCT: 28.1 — ABNORMAL LOW
Hemoglobin: 9.5 — ABNORMAL LOW
Hemoglobin: 10.3 — ABNORMAL LOW
MCHC: 33.7
MCHC: 33.3
MCV: 96.6
Platelets: 340
RBC: 2.91 — ABNORMAL LOW
RBC: 3.19 — ABNORMAL LOW
RDW: 14.6 — ABNORMAL HIGH
WBC: 10.6 — ABNORMAL HIGH
WBC: 13 — ABNORMAL HIGH

## 2011-02-13 LAB — BASIC METABOLIC PANEL
CO2: 25
Calcium: 9.3
GFR calc Af Amer: 40 — ABNORMAL LOW
Sodium: 138

## 2011-02-13 LAB — BASIC METABOLIC PANEL WITH GFR
BUN: 11
CO2: 29
Calcium: 9.2
Chloride: 102
Creatinine, Ser: 0.77
GFR calc non Af Amer: >60
Glucose, Bld: 101 — ABNORMAL HIGH
Potassium: 4.4
Sodium: 136

## 2011-02-13 LAB — DIFFERENTIAL
Basophils Absolute: 0
Eosinophils Absolute: 0
Lymphocytes Relative: 8 — ABNORMAL LOW
Lymphs Abs: 1
Neutrophils Relative %: 85 — ABNORMAL HIGH

## 2011-02-13 LAB — ABO/RH: ABO/RH(D): O POS

## 2011-02-13 LAB — PROTIME-INR
INR: 1
Prothrombin Time: 13.7

## 2011-02-13 LAB — APTT: aPTT: 25

## 2011-02-13 LAB — TYPE AND SCREEN

## 2011-02-15 LAB — COMPREHENSIVE METABOLIC PANEL
ALT: 30
AST: 27
Alkaline Phosphatase: 69
CO2: 26
Chloride: 100
GFR calc Af Amer: 43 — ABNORMAL LOW
GFR calc non Af Amer: 36 — ABNORMAL LOW
Glucose, Bld: 145 — ABNORMAL HIGH
Potassium: 4.4
Sodium: 137

## 2011-02-15 LAB — APTT: aPTT: 24

## 2011-02-15 LAB — PROTIME-INR
INR: 1
Prothrombin Time: 13.3

## 2011-02-15 LAB — POCT CARDIAC MARKERS
CKMB, poc: <1 — ABNORMAL LOW
Myoglobin, poc: 96.3
Operator id: 4295
Troponin i, poc: <0.05

## 2011-02-15 LAB — COMPREHENSIVE METABOLIC PANEL WITH GFR
Albumin: 3.8
BUN: 26 — ABNORMAL HIGH
Calcium: 9.8
Creatinine, Ser: 1.45 — ABNORMAL HIGH
Total Bilirubin: 1
Total Protein: 7.1

## 2011-02-15 LAB — DIFFERENTIAL
Basophils Absolute: 0
Basophils Relative: 0
Eosinophils Absolute: 0.1
Eosinophils Relative: 1
Lymphocytes Relative: 13
Lymphs Abs: 1.9
Monocytes Absolute: 1.3 — ABNORMAL HIGH
Monocytes Relative: 9
Neutro Abs: 11.2 — ABNORMAL HIGH
Neutrophils Relative %: 77

## 2011-02-15 LAB — ABO/RH: ABO/RH(D): O POS

## 2011-02-15 LAB — CBC
HCT: 37.5
Hemoglobin: 12.7
MCHC: 33.9
MCV: 95.5
Platelets: 330
RBC: 3.93
RDW: 14.8 — ABNORMAL HIGH
WBC: 14.6 — ABNORMAL HIGH

## 2011-02-15 LAB — TYPE AND SCREEN
ABO/RH(D): O POS
Antibody Screen: NEGATIVE

## 2011-02-23 ENCOUNTER — Ambulatory Visit (INDEPENDENT_AMBULATORY_CARE_PROVIDER_SITE_OTHER): Payer: Medicare Other | Admitting: Internal Medicine

## 2011-02-23 ENCOUNTER — Encounter: Payer: Self-pay | Admitting: Internal Medicine

## 2011-02-23 DIAGNOSIS — I10 Essential (primary) hypertension: Secondary | ICD-10-CM

## 2011-02-23 DIAGNOSIS — J069 Acute upper respiratory infection, unspecified: Secondary | ICD-10-CM

## 2011-02-23 DIAGNOSIS — I2510 Atherosclerotic heart disease of native coronary artery without angina pectoris: Secondary | ICD-10-CM

## 2011-02-23 MED ORDER — AZITHROMYCIN 250 MG PO TABS: ORAL_TABLET | ORAL | Status: AC

## 2011-02-23 NOTE — Assessment & Plan Note (Signed)
Continue with current prescription therapy as reflected on the Med list.  

## 2011-02-23 NOTE — Progress Notes (Signed)
  Subjective:    Patient ID: [name], female    DOB: [birth year], 70 y.o.   MRN: [id number]  HPI   The patient presents for a follow-up of  chronic hypertension, chronic dyslipidemia, CAD controlled with medicines  HPI  C/o URI sx's x   days. C/o ST, cough, weakness. Not better with OTC medicines. Actually, the patient is getting worse. The patient did not sleep last night due to cough.  Review of Systems  Constitutional: Positive for fever, chills and fatigue.  HENT: Positive for congestion, rhinorrhea, sneezing and postnasal drip.   Eyes: Positive for photophobia and pain. Negative for discharge and visual disturbance.  Respiratory: Positive for cough and wheezing.   Positive for chest pain.  Gastrointestinal: Negative for vomiting, abdominal pain, diarrhea and abdominal distention.  Genitourinary: Negative for dysuria and difficulty urinating.  Skin: Negative for rash.  Neurological: Positive for dizziness, weakness and light-headedness.        Review of Systems     Objective:   Physical Exam  Constitutional: She appears well-developed and well-nourished. No distress.  HENT:  Head: Normocephalic.  Right Ear: External ear normal.  Left Ear: External ear normal.  Nose: Nose normal.       eryth throat  Eyes: Conjunctivae are normal. Pupils are equal, round, and reactive to light. Right eye exhibits no discharge. Left eye exhibits no discharge.  Neck: Normal range of motion. Neck supple. No JVD present. No tracheal deviation present. No thyromegaly present.  Cardiovascular: Normal rate, regular rhythm and normal heart sounds.   Pulmonary/Chest: No stridor. No respiratory distress. She has no wheezes.  Abdominal: Soft. Bowel sounds are normal. She exhibits no distension and no mass. There is no tenderness. There is no rebound and no guarding.  Musculoskeletal: She exhibits no edema and no tenderness.  Lymphadenopathy:    She has no cervical adenopathy.  Neurological:  She displays normal reflexes. No cranial nerve deficit. She exhibits normal muscle tone. Coordination normal.  Skin: No rash noted. No erythema.  Psychiatric: She has a normal mood and affect. Her behavior is normal. Judgment and thought content normal.          Assessment & Plan:

## 2011-02-23 NOTE — Assessment & Plan Note (Signed)
Zpac if worse 

## 2011-04-01 ENCOUNTER — Ambulatory Visit (INDEPENDENT_AMBULATORY_CARE_PROVIDER_SITE_OTHER): Payer: Medicare Other | Admitting: Family Medicine

## 2011-04-01 ENCOUNTER — Encounter: Payer: Self-pay | Admitting: Family Medicine

## 2011-04-01 VITALS — BP 110/60 | HR 82 | Temp 98.0°F | Wt 156.0 lb

## 2011-04-01 DIAGNOSIS — J40 Bronchitis, not specified as acute or chronic: Secondary | ICD-10-CM

## 2011-04-01 MED ORDER — ALBUTEROL SULFATE HFA 108 (90 BASE) MCG/ACT IN AERS: 2.0000 | INHALATION_SPRAY | RESPIRATORY_TRACT | Status: DC | PRN

## 2011-04-01 MED ORDER — AMOXICILLIN-POT CLAVULANATE 875-125 MG PO TABS: 1.0000 | ORAL_TABLET | Freq: Two times a day (BID) | ORAL | Status: DC

## 2011-04-01 NOTE — Progress Notes (Signed)
  Subjective:    Patient ID: [name], female    DOB: 1940-05-28, 70 y.o.   MRN: [id number]  HPI Here for 2 months of chest tightness and a dry cough. No fever. She finished a Zpack last week with no results. She had been on low dose prednisone daily for her rheumatoid arthritis, but this was stopped 2 weeks ago.   Review of Systems  Constitutional: Negative.   HENT: Negative.   Eyes: Negative.   Respiratory: Positive for cough and chest tightness. Negative for shortness of breath and wheezing.        Objective:   Physical Exam  Constitutional: She appears well-developed and well-nourished.  HENT:  Right Ear: External ear normal.  Left Ear: External ear normal.  Nose: Nose normal.  Mouth/Throat: Oropharynx is clear and moist. No oropharyngeal exudate.  Eyes: Conjunctivae are normal.  Neck: No thyromegaly present.  Pulmonary/Chest: Effort normal and breath sounds normal. No respiratory distress. She has no wheezes. She has no rales.  Lymphadenopathy:    She has no cervical adenopathy.          Assessment & Plan:  Drink fluids. Add an inhaler prn

## 2011-04-07 ENCOUNTER — Ambulatory Visit (INDEPENDENT_AMBULATORY_CARE_PROVIDER_SITE_OTHER)
Admission: RE | Admit: 2011-04-07 | Discharge: 2011-04-07 | Disposition: A | Discharge status: Home / Self Care | Payer: Medicare Other | Source: Ambulatory Visit | Attending: Internal Medicine | Admitting: Internal Medicine

## 2011-04-07 ENCOUNTER — Encounter: Payer: Self-pay | Admitting: Internal Medicine

## 2011-04-07 ENCOUNTER — Ambulatory Visit (INDEPENDENT_AMBULATORY_CARE_PROVIDER_SITE_OTHER): Payer: Medicare Other | Admitting: Internal Medicine

## 2011-04-07 VITALS — BP 112/70 | HR 87 | Temp 98.6°F

## 2011-04-07 DIAGNOSIS — R05 Cough: Secondary | ICD-10-CM

## 2011-04-07 DIAGNOSIS — M069 Rheumatoid arthritis, unspecified: Secondary | ICD-10-CM

## 2011-04-07 MED ORDER — BENZONATATE 100 MG PO CAPS
100.0000 mg | ORAL_CAPSULE | Freq: Three times a day (TID) | ORAL | Status: DC | PRN
Start: 2011-04-07 — End: 2011-08-21

## 2011-04-07 MED ORDER — CHLORPHENIRAMINE-HYDROCODONE 8-10 MG/5ML PO LQCR: 5.0000 mL | Freq: Two times a day (BID) | ORAL | Status: AC | PRN

## 2011-04-07 MED ORDER — CETIRIZINE HCL 10 MG PO TABS: 10.0000 mg | ORAL_TABLET | Freq: Every day | ORAL | Status: DC

## 2011-04-07 NOTE — Assessment & Plan Note (Signed)
Ongoing symptoms greater than 2 months Interval treatment with Z-Pak and Augmentin over the past 8 weeks without improvement Suspect viral bronchitis trigger now with post viral cough syndrome  Check x-ray, especially on the methotrexate immunosuppression therapy for rheumatoid arthritis Hold empiric antibiotics at this time Symptomatic control of postnasal drip with antihistamines, antitussives with Tussionex each bedtime and Tessalon during the day Also advised H2 blocker for possible reflux component Okay to continue albuterol inhaler as needed

## 2011-04-07 NOTE — Patient Instructions (Signed)
It was good to see you today. If you develop worsening symptoms or fever, call and we can reconsider antibiotics, but it does not appear necessary to use antibiotics at this time. Chest xray ordered today. Your results will be called to you after review (48-72hours after test completion). If any changes need to be made, you will be notified at that time. tussionex syrup at night for cough tessalon pearls 100mg  each night before bed (with syrup), and can use every 6hrs during day if needed.  Can stop after 2 weeks if doing well. pepcid 20mg  (OTC) one before dinner for 2 weeks only. Zyrtec 10mg  daily x 2 weeks only hard candy to bathe back of throat during day, no throat clearing limit voice use, no singing for next 2 weeks.

## 2011-04-07 NOTE — Progress Notes (Signed)
  Subjective:    Patient ID: [name], female    DOB: [birth year], 70 y.o.   MRN: [id number]  HPI complains of cough  Onset > 2 months ago Unimproved after 2 courses of antibiotics in the past 6 weeks Associated with trace sputum, white Denies associated chest pain, pleurisy, shortness of breath or fever Positive rhinorrhea and allergy symptoms but no sore throat  Past Medical History  Diagnosis Date  . Hypertension   . Thyroid disease     hypo  . Arthritis     rheumatoid 2011 Dr. Charlestine Night  . CAD (coronary artery disease)   . Cervical disc disease   . Polymyalgia rheumatica     2011 Dr. Charlestine Night  . Anemia   . Hyperlipidemia     Review of Systems  Constitutional: Negative for fever, fatigue and unexpected weight change.  HENT: Positive for postnasal drip. Negative for sinus pressure.   Respiratory: Negative for chest tightness, wheezing and stridor.   Cardiovascular: Negative for chest pain and palpitations.       Objective:   Physical Exam BP 112/70  Pulse 87  Temp(Src) 98.6 F (37 C) (Oral)  SpO2 94% Gen.: Occasional dry cough but nontoxic and in no acute distress HEENT: Mildly erythematous oropharynx with clear PND, no exudate Lungs: Clear to auscultation bilaterally without wheeze or crackle, good air movement bilaterally Cardiovascular: Regular rate and rhythm, no edema  Lab Results  Component Value Date   WBC 10.6* 06/21/2010   HGB 12.2 06/21/2010   HCT 36.5 06/21/2010   PLT 329.0 06/21/2010   GLUCOSE 75 06/21/2010   CHOL 171 06/21/2010   TRIG 118.0 06/21/2010   HDL 71.90 06/21/2010   LDLDIRECT 162.1 10/28/2009   LDLCALC 76 06/21/2010   ALT 18 06/21/2010   AST 21 06/21/2010   NA 144 06/21/2010   K 4.4 06/21/2010   CL 106 06/21/2010   CREATININE 1.3* 06/21/2010   BUN 27* 06/21/2010   CO2 31 06/21/2010   TSH 2.00 06/21/2010   INR 1.0 11/29/2006        Assessment & Plan:  See problem list. Medications and labs reviewed today. Prior office visits/tx with  primary care physician and recent Saturday clinic provider reviewed today

## 2011-04-07 NOTE — Assessment & Plan Note (Signed)
On MTX now

## 2011-07-06 ENCOUNTER — Encounter: Payer: Medicare Other | Admitting: Internal Medicine

## 2011-07-13 ENCOUNTER — Encounter: Payer: Self-pay | Admitting: Internal Medicine

## 2011-07-13 ENCOUNTER — Ambulatory Visit (INDEPENDENT_AMBULATORY_CARE_PROVIDER_SITE_OTHER): Payer: Medicare Other | Admitting: Internal Medicine

## 2011-07-13 VITALS — BP 138/90 | HR 84 | Temp 96.7°F | Resp 16 | Ht 63.0 in | Wt 154.0 lb

## 2011-07-13 DIAGNOSIS — Q8909 Congenital malformations of spleen: Secondary | ICD-10-CM

## 2011-07-13 DIAGNOSIS — E039 Hypothyroidism, unspecified: Secondary | ICD-10-CM

## 2011-07-13 DIAGNOSIS — M21619 Bunion of unspecified foot: Secondary | ICD-10-CM

## 2011-07-13 DIAGNOSIS — M069 Rheumatoid arthritis, unspecified: Secondary | ICD-10-CM

## 2011-07-13 DIAGNOSIS — M21611 Bunion of right foot: Secondary | ICD-10-CM | POA: Insufficient documentation

## 2011-07-13 DIAGNOSIS — I10 Essential (primary) hypertension: Secondary | ICD-10-CM

## 2011-07-13 MED ORDER — LEVOTHYROXINE SODIUM 88 MCG PO TABS: 88.0000 ug | ORAL_TABLET | Freq: Every day | ORAL | Status: DC

## 2011-07-13 MED ORDER — METOPROLOL SUCCINATE ER 25 MG PO TB24: 25.0000 mg | ORAL_TABLET | Freq: Every day | ORAL | Status: DC

## 2011-07-13 MED ORDER — ATORVASTATIN CALCIUM 20 MG PO TABS: 20.0000 mg | ORAL_TABLET | Freq: Every day | ORAL | Status: DC

## 2011-07-13 MED ORDER — CLOPIDOGREL BISULFATE 75 MG PO TABS: 75.0000 mg | ORAL_TABLET | Freq: Every day | ORAL | Status: DC

## 2011-07-13 NOTE — Patient Instructions (Signed)
BP Readings from Last 3 Encounters:  07/13/11 138/90  04/07/11 112/70  04/01/11 110/60   Wt Readings from Last 3 Encounters:  07/13/11 154 lb (69.854 kg)  04/01/11 156 lb (70.761 kg)  02/23/11 151 lb (68.493 kg)

## 2011-07-13 NOTE — Progress Notes (Signed)
Patient ID: [name], female   DOB: [birth year], 71 y.o.   MRN: [id number]  Subjective:    Patient ID: [name], female    DOB: December 26, 1940, 71 y.o.   MRN: [id number]  HPI  The patient is here for a wellness exam. The patient has been doing well overall without major physical or psychological issues going on lately.   The patient presents for a follow-up of  chronic hypertension, chronic dyslipidemia, CAD, RA controlled with medicines  C/o R foot/bunion pain x months  BP Readings from Last 3 Encounters:  07/13/11 138/90  04/07/11 112/70  04/01/11 110/60     Review of Systems  Constitutional: Positive for fatigue. Negative for chills, activity change, appetite change and unexpected weight change.  HENT: Negative for congestion, mouth sores and sinus pressure.   Eyes: Negative for visual disturbance.  Respiratory: Negative for cough and chest tightness.   Cardiovascular: Negative for chest pain.  Gastrointestinal: Negative for nausea and abdominal pain.  Genitourinary: Negative for frequency, difficulty urinating and vaginal pain.  Musculoskeletal: Positive for back pain and arthralgias. Negative for joint swelling and gait problem.  Skin: Negative for pallor and rash.  Neurological: Negative for dizziness, tremors, weakness, numbness and headaches.  Psychiatric/Behavioral: Negative for confusion and sleep disturbance.       Objective:   Physical Exam  Constitutional: She appears well-developed and well-nourished. No distress.  HENT:  Head: Normocephalic.  Right Ear: External ear normal.  Left Ear: External ear normal.  Nose: Nose normal.  Mouth/Throat: Oropharynx is clear and moist.  Eyes: Conjunctivae are normal. Pupils are equal, round, and reactive to light. Right eye exhibits no discharge. Left eye exhibits no discharge.  Neck: Normal range of motion. Neck supple. No JVD present. No tracheal deviation present. No thyromegaly present.  Cardiovascular: Normal  rate, regular rhythm and normal heart sounds.   Pulmonary/Chest: No stridor. No respiratory distress. She has no wheezes.  Abdominal: Soft. Bowel sounds are normal. She exhibits no distension and no mass. There is no tenderness. There is no rebound and no guarding.  Musculoskeletal: She exhibits no edema and no tenderness.  Lymphadenopathy:    She has no cervical adenopathy.  Neurological: She displays normal reflexes. No cranial nerve deficit. She exhibits normal muscle tone. Coordination normal.  Skin: No rash noted. No erythema.  Psychiatric: She has a normal mood and affect. Her behavior is normal. Judgment and thought content normal.        Wt Readings from Last 3 Encounters:  07/13/11 154 lb (69.854 kg)  04/01/11 156 lb (70.761 kg)  02/23/11 151 lb (68.493 kg)     Assessment & Plan:

## 2011-07-13 NOTE — Assessment & Plan Note (Signed)
Continue with current prescription therapy as reflected on the Med list.  

## 2011-07-13 NOTE — Assessment & Plan Note (Signed)
Long term  Augmentin prn

## 2011-07-13 NOTE — Assessment & Plan Note (Signed)
Chronic. 

## 2011-07-13 NOTE — Assessment & Plan Note (Signed)
Predn 5 mg/d 

## 2011-07-13 NOTE — Assessment & Plan Note (Signed)
Dr Paulla Dolly - consult

## 2011-07-31 HISTORY — PX: BUNIONECTOMY: SHX129

## 2011-08-21 ENCOUNTER — Other Ambulatory Visit: Payer: Self-pay | Admitting: *Deleted

## 2011-08-21 ENCOUNTER — Ambulatory Visit (INDEPENDENT_AMBULATORY_CARE_PROVIDER_SITE_OTHER): Payer: Medicare Other | Admitting: Family Medicine

## 2011-08-21 VITALS — BP 123/67 | HR 74 | Temp 98.1°F | Resp 16 | Ht 63.58 in | Wt 156.6 lb

## 2011-08-21 DIAGNOSIS — I728 Aneurysm of other specified arteries: Secondary | ICD-10-CM

## 2011-08-21 DIAGNOSIS — H11429 Conjunctival edema, unspecified eye: Secondary | ICD-10-CM

## 2011-08-21 DIAGNOSIS — H2000 Unspecified acute and subacute iridocyclitis: Secondary | ICD-10-CM

## 2011-08-21 NOTE — Progress Notes (Signed)
Patient ID: [name], female   DOB: 08-31-1940, 71 y.o.   MRN: [id number] [name] is a 71 y.o. female with PMH significant for macular degeneration, hypothyroidism, HTN, and CAD who presents to [facility] today for 1 day history of red eye and eye pain.    1.  Left eye pain:  Began this AM, awoke her from sleep.  Describes pain as "my whole eye hurts."  Has never had pain or redness like this before.  Describes unilateral eye drainage as well as pain.  No crusting of eye.  No trauma to eye.  No recent illnesses, no rhinorrhea, cough.  No problems with seasonal allergies.  No foreign body sensation. Does not wear contacts, does wear glasses.  Dr. Kathrin Penner is her ophthalmologist.    PMH, Social, Family History reviewed.  Patient is a nonsmoker.   ROS as above otherwise neg. No Chest pain, palpitations, SOB, Fever, Chills, Abd pain, N/V/D.  Medications reviewed. Current Outpatient Prescriptions  Medication Sig Dispense Refill  . atorvastatin (LIPITOR) 20 MG tablet Take 1 tablet (20 mg total) by mouth daily.  90 tablet  3  . Calcium Carbonate-Vit D-Min (CALCIUM 1200 PO) Take 1 tablet by mouth daily.        . cephALEXin (KEFLEX) 500 MG capsule Take 500 mg by mouth 2 (two) times daily.      . cetirizine (ZYRTEC ALLERGY) 10 MG tablet Take 1 tablet (10 mg total) by mouth daily. X 2 weeks, then as needed for runny nose or cough/congestion      . Cholecalciferol 1000 UNITS tablet Take 1,000 Units by mouth daily.        . clopidogrel (PLAVIX) 75 MG tablet Take 1 tablet (75 mg total) by mouth daily.  90 tablet  3  . levothyroxine (LEVOXYL) 88 MCG tablet Take 1 tablet (88 mcg total) by mouth daily.  90 tablet  3  . losartan (COZAAR) 50 MG tablet Take 25 mg by mouth daily.       . metoprolol succinate (TOPROL-XL) 25 MG 24 hr tablet Take 1 tablet (25 mg total) by mouth daily.  90 tablet  3  . Multiple Vitamin (MULTIVITAMIN PO) Take 1 tablet by mouth daily.        . predniSONE (DELTASONE) 5 MG tablet Take  5 mg by mouth every other day.      . traMADol (ULTRAM) 50 MG tablet Take 50-100 mg by mouth 2 (two) times daily as needed.          Exam:  BP 123/67  Pulse 74  Temp(Src) 98.1 F (36.7 C) (Oral)  Resp 16  Ht 5' 3.58" (1.615 m)  Wt 156 lb 9.6 oz (71.033 kg)  BMI 27.24 kg/m2 Gen: Well NAD HEENT: EOMI,  MMM.  Right eye WNL. Bilateral pupils are equal, round, and reactive.  Left eye with diffuse conjunctival bulbar erythema.  EOMI intact without pain.  Ears clear BL Lungs: CTABL Nl WOB Heart: RRR no MRG Abd: NABS, NT, ND Exts: Non edematous BL  LE, warm and well perfused.   Procedural Note:  Informed consent obtained.   0.5% Proparacaine solution applied to Left eye, 1 drop.  No relief of pain.  Fluorescein paper applied to corner of eye.  UV light used to examine eye.  No corneal abrasion noted.     No results found for this or any previous visit (from the past 72 hour(s)).   Assessment and Plan:  1.  Iritis:  Most likely  diagnosis based on presentation.  Fluorescein staining negative.  No relief with topical anesthetic. Called and discussed case with Ophthalmologist on call for Dr. Elder Negus office, recommended 600 mg Ibuprofen for relief and urgent follow-up tomorrow.  Will place on walk-in list but also advised patient to call for appointment to be seen tomorrow.  Discussed with patient.  I provided the patient with explicit warnings and red flags that would prompt return to here for care or the ED.  Patient expressed understanding.    Alveda Reasons, MD

## 2011-08-21 NOTE — Patient Instructions (Signed)
I spoke with the doctor on call for Dr. Kathrin Penner.   Make an appointment to see them first thing in the morning.  You have been placed on the work-in list. Take Ibuprofen 600 mg every 8 hours for pain relief until then.   Make sure to keep the appointment tomorrow.   Iritis Iritis is an inflammation of the colored part of the eye (iris). Other parts at the front of the eye may also be inflamed. The iris is part of the middle layer of the eyeball which is called the uvea or the uveal track. Any part of the uveal track can become inflamed. The other portions of the uveal track are the choroid (the thin membrane under the outer layer of the eye), and the ciliary body (joins the choroid and the iris and produces the fluid in the front of the eye).  It is extremely important to treat iritis early, as it may lead to internal eye damage causing scarring or diseases such as glaucoma. Some people have only one attack of iritis (in one or both eyes) in their lifetime, while others may get it many times. CAUSES Iritis can be associated with many different diseases, but mostly occurs in otherwise healthy people. Examples of diseases that can be associated with iritis include:  Diseases where the body's immune system attacks tissues within your own body (autoimmune diseases).   Infections (tuberculosis, gonorrhea, fungus infections, Lyme disease, infection of the lining of the heart).   Trauma or injury.   Eye diseases (acute glaucoma and others).   Inflammation from other parts of the uveal track.   Severe eye infections.   Other rare diseases.  SYMPTOMS  Eye pain or aching.   Sensitivity to light.   Loss of sight or blurred vision.   Redness of the eye. This is often accompanied by a ring of redness around the outside of the cornea, or clear covering at the front of the eye (ciliary flush).   Excessive tearing of the eye(s).   A small pupil that does not enlarge in the dark and stays  smaller than the other eye's pupil.   A whitish area that obscures the lower part of the colored circular iris. Sometimes this is visible when looking at the eye, where the whitish area has a "fluid level" or flat top. This is called a "hypopyon" and is actually pus inside the eye.  Since iritis causes the eye to become red, it is often confused with a much less dangerous form of "pink eye" or conjunctivitis. One of the most important symptoms is sensitivity to light. Anytime there is redness, discomfort in the eye(s) and extreme light sensitivity, it is extremely important to see an ophthalmologist as soon as possible. TREATMENT Acute iritis requires prompt medical evaluation by an eye specialist (ophthalmologist.) Treatment depends on the underlying cause but may include:  Corticosteroid eye drops and dilating eye drops. Follow your caregiver's exact instructions on taking and stopping corticosteroid medications (drops or pills).   Occasionally, the iritis will be so severe that it will not respond to commonly used medications. If this happens, it may be necessary to use steroid injections. The injections are given under the eye's outer surface. Sometimes oral medications are given. The decision on treatment used for iritis is usually made on an individual basis.  HOME CARE INSTRUCTIONS Your care giver will give specific instructions regarding the use of eye medications or other medications. Be certain to follow all instructions in both taking  and stopping the medications. SEEK IMMEDIATE MEDICAL CARE IF:  You have redness of one or both eye.   You experience a great deal of light sensitivity.   You have pain or aching in either eye.  MAKE SURE YOU:   Understand these instructions.   Will watch your condition.   Will get help right away if you are not doing well or get worse.  Document Released: 04/17/2005 Document Revised: 04/06/2011 Document Reviewed: 10/05/2006 [facility] Patient  Information 2012 [place].

## 2011-08-26 ENCOUNTER — Encounter (HOSPITAL_COMMUNITY): Payer: Self-pay

## 2011-08-26 ENCOUNTER — Other Ambulatory Visit: Payer: Self-pay

## 2011-08-26 ENCOUNTER — Emergency Department (HOSPITAL_COMMUNITY)
Admission: EM | Admit: 2011-08-26 | Discharge: 2011-08-26 | Disposition: A | Discharge status: Home / Self Care | Payer: Medicare Other | Attending: Emergency Medicine | Admitting: Emergency Medicine

## 2011-08-26 ENCOUNTER — Emergency Department (HOSPITAL_COMMUNITY)
Admission: EM | Admit: 2011-08-26 | Discharge: 2011-08-27 | Disposition: A | Discharge status: Home / Self Care | Payer: Medicare Other | Attending: Emergency Medicine | Admitting: Emergency Medicine

## 2011-08-26 ENCOUNTER — Encounter (HOSPITAL_COMMUNITY): Payer: Self-pay | Admitting: *Deleted

## 2011-08-26 DIAGNOSIS — I10 Essential (primary) hypertension: Secondary | ICD-10-CM | POA: Insufficient documentation

## 2011-08-26 DIAGNOSIS — I2510 Atherosclerotic heart disease of native coronary artery without angina pectoris: Secondary | ICD-10-CM | POA: Insufficient documentation

## 2011-08-26 DIAGNOSIS — R5381 Other malaise: Secondary | ICD-10-CM | POA: Insufficient documentation

## 2011-08-26 DIAGNOSIS — R231 Pallor: Secondary | ICD-10-CM | POA: Insufficient documentation

## 2011-08-26 DIAGNOSIS — M129 Arthropathy, unspecified: Secondary | ICD-10-CM | POA: Insufficient documentation

## 2011-08-26 DIAGNOSIS — IMO0002 Reserved for concepts with insufficient information to code with codable children: Secondary | ICD-10-CM | POA: Insufficient documentation

## 2011-08-26 DIAGNOSIS — T8131XA Disruption of external operation (surgical) wound, not elsewhere classified, initial encounter: Secondary | ICD-10-CM

## 2011-08-26 DIAGNOSIS — Z951 Presence of aortocoronary bypass graft: Secondary | ICD-10-CM | POA: Insufficient documentation

## 2011-08-26 DIAGNOSIS — E860 Dehydration: Secondary | ICD-10-CM | POA: Insufficient documentation

## 2011-08-26 DIAGNOSIS — T8130XA Disruption of wound, unspecified, initial encounter: Secondary | ICD-10-CM | POA: Insufficient documentation

## 2011-08-26 DIAGNOSIS — R5383 Other fatigue: Secondary | ICD-10-CM | POA: Insufficient documentation

## 2011-08-26 DIAGNOSIS — Z79899 Other long term (current) drug therapy: Secondary | ICD-10-CM | POA: Insufficient documentation

## 2011-08-26 DIAGNOSIS — Y838 Other surgical procedures as the cause of abnormal reaction of the patient, or of later complication, without mention of misadventure at the time of the procedure: Secondary | ICD-10-CM | POA: Insufficient documentation

## 2011-08-26 DIAGNOSIS — E785 Hyperlipidemia, unspecified: Secondary | ICD-10-CM | POA: Insufficient documentation

## 2011-08-26 LAB — CBC
MCH: 31.6 pg (ref 26.0–34.0)
MCHC: 33.4 g/dL (ref 30.0–36.0)
MCV: 94.5 fL (ref 78.0–100.0)
Platelets: 344 10*3/uL (ref 150–400)
RBC: 3.48 MIL/uL — ABNORMAL LOW (ref 3.87–5.11)
RDW: 14 % (ref 11.5–15.5)

## 2011-08-26 LAB — POCT I-STAT, CHEM 8
BUN: 30 mg/dL — ABNORMAL HIGH (ref 6–23)
Calcium, Ion: 1.27 mmol/L (ref 1.12–1.32)
HCT: 34 % — ABNORMAL LOW (ref 36.0–46.0)
Hemoglobin: 11.6 g/dL — ABNORMAL LOW (ref 12.0–15.0)
Sodium: 138 mEq/L (ref 135–145)
TCO2: 26 mmol/L (ref 0–100)

## 2011-08-26 MED ORDER — LIDOCAINE-EPINEPHRINE (PF) 1 %-1:200000 IJ SOLN
INTRAMUSCULAR | Status: AC
  Filled 2011-08-26: qty 10

## 2011-08-26 MED ORDER — SODIUM CHLORIDE 0.9 % IV BOLUS (SEPSIS): 250.0000 mL | Freq: Once | INTRAVENOUS | Status: DC

## 2011-08-26 MED ORDER — SODIUM CHLORIDE 0.9 % IV BOLUS (SEPSIS): 1000.0000 mL | Freq: Once | INTRAVENOUS | Status: DC

## 2011-08-26 MED ORDER — OXYCODONE HCL 5 MG PO TABS: 5.0000 mg | ORAL_TABLET | Freq: Four times a day (QID) | ORAL | Status: AC | PRN

## 2011-08-26 MED ORDER — HYDROCODONE-ACETAMINOPHEN 5-325 MG PO TABS
1.0000 | ORAL_TABLET | Freq: Once | ORAL | Status: AC
  Administered 2011-08-26: 1 via ORAL
  Filled 2011-08-26: qty 1

## 2011-08-26 NOTE — ED Provider Notes (Signed)
Medical screening examination/treatment/procedure(s) were conducted as a shared visit with non-physician practitioner(s) and myself.  I personally evaluated the patient during the encounter  Wound exam and and has been repaired by the mid-level provider. Patient's podiatrist was consult at and followup as been arranged  Leota Jacobsen, MD 08/26/11 1328

## 2011-08-26 NOTE — ED Notes (Signed)
Pt in from home with bleeding from the top of right foot pressure dressing applied prior to arrival states bunion surgery 08/14/2011 bleeding controlled at present

## 2011-08-26 NOTE — ED Notes (Signed)
Pt to be discharged after diet and ambulation

## 2011-08-26 NOTE — ED Notes (Signed)
Pt did not use crutches prescribed in the ED

## 2011-08-26 NOTE — ED Notes (Signed)
[id number] Expected date:<BR> Expected time:11:21 AM<BR> Means of arrival:Ambulance<BR> Comments:<BR> M90 -- Right Foot Hemorrhage

## 2011-08-26 NOTE — Discharge Instructions (Signed)
Wound Dehiscence Wound dehiscence is when a surgical cut (incision) opens up. It usually happens 7 to 10 days after surgery. You may have pain, a fever, or have more fluid coming from the cut. It should be treated early. HOME CARE  Only take medicines as told by your doctor.   Take your medicines (antibiotics) as told. Finish them even if you start to feel better.   Wash your wound with warm, soapy water 2 times a day, or as told. Pat the wound dry. Do not rub the wound.   Change bandages (dressings) as often as told. Wash your hands before and after changing bandages. Apply bandages as told.   Take showers. Do not soak the wound, bathe, or swim until your wound is healed.   Avoid exercises that make you sweat.   Use medicines that stop itching as told by your doctor. The wound may itch as it heals. Do not pick or scratch at the wound.   Do not lift more than 10 pounds (4.5 kilograms) until the wound is healed, or as told by your doctor.   Keep all doctor visits as told.  GET HELP RIGHT AWAY IF:   You have more puffiness (swelling) or redness around the wound.   You have more pain in the wound.   You have yellowish white fluid (pus) coming from the wound.   More of the wound breaks open.   You have a fever.  MAKE SURE YOU:   Understand these instructions.   Will watch your condition.   Will get help right away if you are not doing well or get worse.  Document Released: 04/05/2009 Document Revised: 04/06/2011 Document Reviewed: 08/21/2010 [facility] Patient Information 2012 [place]. Your bunionectomy wound has been resutured to pressure dressing has been applied.  Please keep her foot elevated as much as possible.  Ambulating only to the bathroom as needed.  Please followup in his office first thing Monday morning.  He do not need to call for an appointment time.  They will be expecting you.  If you have resumption of any bleeding through the dressing please return to  the emergency department for further evaluation STOP [place] on Monday

## 2011-08-26 NOTE — ED Notes (Signed)
Per EMS- pt in c/o bleeding from bunion site on right foot, states she was seen here today and area was sutured, noted new drainage tonight, no new injury

## 2011-08-26 NOTE — ED Notes (Signed)
Pt in via ems with bleeding to the right foot states post bunion removal 08-14-11 pt presents with soiled pressure dressing bleeding is active at the time states pain to the right foot radiating up leg

## 2011-08-26 NOTE — ED Notes (Signed)
Pt instructed on crutch use unable to return demonstrate, unable to ambulate effectively using crutches states she would rather [place] a walker

## 2011-08-26 NOTE — ED Provider Notes (Signed)
History     CSN: [id number]  Arrival date & time 08/26/11  2027   First MD Initiated Contact with Patient 08/26/11 2147      Chief Complaint  Patient presents with  . Bleeding from suture site     (Consider location/radiation/quality/duration/timing/severity/associated sxs/prior treatment) HPI History provided by pt.   Pt had a bunion removed from right foot on 4/15.  Came to ED this morning w/ non-traumatic post-operative bleeding from wound.  Wound was sutured and dressed w/ ace bandage.  Pt returns this evening because her wound began to bleed again while walking to the bathroom.  She contacted her podiatrist and he referred her to ED to receive platelets.  Wound hemostatic since arriving in ED.  Pt is anti-coagulated w/ plavix which she has been taking since CABG several years ago.  She has also been experiencing lightheadedness with walking since leaving ED today.  Was prescribed oxycodone for pain.  No recent CP, SOB, vomiting, diarrhea.  Has been drinking fluids.    Past Medical History  Diagnosis Date  . Hypertension   . Thyroid disease     hypo  . Arthritis     rheumatoid 2011 Dr. Charlestine Night  . CAD (coronary artery disease)   . Cervical disc disease   . Polymyalgia rheumatica     2011 Dr. Charlestine Night  . Anemia   . Hyperlipidemia     Past Surgical History  Procedure Date  . Coronary artery bypass graft   . Splenectomy   . Cardiac catheterization   . Cervical spine surgery     plate 2008 Dr. Saintclair Halsted  . Carpal tunnel release   . Cataract extraction   . Hernia repair     Family History  Problem Relation Age of Onset  . Hypertension    . Coronary artery disease    . Asthma Neg Hx   . Hypertension Mother   . Hypertension Father   . Colon cancer Neg Hx     History  Substance Use Topics  . Smoking status: Former Research scientist (life sciences)  . Smokeless tobacco: Not on file  . Alcohol Use: Yes    OB History    Grav Para Term Preterm Abortions TAB SAB Ect Mult Living                   Review of Systems  All other systems reviewed and are negative.    Allergies  Aspirin; Codeine; Nitroglycerin; and Tramadol hcl  Home Medications   Current Outpatient Rx  Name Route Sig Dispense Refill  . ATORVASTATIN CALCIUM 20 MG PO TABS Oral Take 1 tablet (20 mg total) by mouth daily. 90 tablet 3  . BESIFLOXACIN HCL 0.6 % OP SUSP Left Eye Place 1 drop into the left eye 3 (three) times daily. She is using for 7 days for conjunctivitis.. She started on 08/23/11 and has not completed.    [name] CALCIUM 1200 PO Oral Take 2 tablets by mouth 2 (two) times daily.     . CEPHALEXIN 500 MG PO CAPS Oral Take 500 mg by mouth 2 (two) times daily. She is taking for 10 days. She started on 08/21/11 and has not completed. She has one more dose to take.    [name] CETIRIZINE HCL 10 MG PO TABS Oral Take 10 mg by mouth daily.    [name] CLOPIDOGREL BISULFATE 75 MG PO TABS Oral Take 1 tablet (75 mg total) by mouth daily. 90 tablet 3  . LEVOTHYROXINE SODIUM 88 MCG PO TABS  Oral Take 1 tablet (88 mcg total) by mouth daily. 90 tablet 3  . LOSARTAN POTASSIUM 50 MG PO TABS Oral Take 25 mg by mouth daily.     [name] METOPROLOL SUCCINATE ER 25 MG PO TB24 Oral Take 1 tablet (25 mg total) by mouth daily. 90 tablet 3  . MULTIVITAMIN PO Oral Take 0.5 tablets by mouth daily.     . OXYCODONE HCL 5 MG PO TABS Oral Take 1 tablet (5 mg total) by mouth every 6 (six) hours as needed for pain. 15 tablet 0  . PREDNISONE 5 MG PO TABS Oral Take 5 mg by mouth every other day. She was taking two tablets every other day and has been weaning herself off of this medication. She started on 06/08/11. She has two more tablets left in her bottle and said that she is not going to take anymore.    [name] TRAMADOL HCL 50 MG PO TABS Oral Take 50-100 mg by mouth 2 (two) times daily as needed. For pain.      BP 108/50  Pulse 80  Temp(Src) 97.6 F (36.4 C) (Oral)  Resp 16  Ht 5\' 3"  (1.6 m)  Wt 155 lb (70.308 kg)  BMI 27.46 kg/m2  SpO2 100%  Physical Exam   Nursing note and vitals reviewed. Constitutional: She is oriented to person, place, and time. She appears well-developed and well-nourished. No distress.  HENT:  Head: Normocephalic and atraumatic.  Mouth/Throat: Oropharynx is clear and moist.  Eyes:       Normal appearance.  Conjunctiva pink.  Neck: Normal range of motion.  Cardiovascular: Normal rate and regular rhythm.   Pulmonary/Chest: Effort normal and breath sounds normal. No respiratory distress.  Musculoskeletal: Normal range of motion.       3cm vertical wound over first distal metatarsal on dorsal surface of foot.  Sutures in place.  Hemostatic. 2+ DP pulse and distal sensation intact.   Neurological: She is alert and oriented to person, place, and time.  Skin: Skin is warm and dry. No rash noted.  Psychiatric: She has a normal mood and affect. Her behavior is normal.    ED Course  Procedures (including critical care time)  Labs Reviewed  POCT I-STAT, CHEM 8 - Abnormal; Notable for the following:    BUN 30 (*)    Creatinine, Ser 1.60 (*)    Glucose, Bld 126 (*)    Hemoglobin 11.6 (*)    HCT 34.0 (*)    All other components within normal limits   No results found.   1. Post-op bleeding   2. Dehydration       MDM  Pt presents for second time today w/ bleeding at post-bunionectomy site.  Anti-coagulated w/ plavix for prevention of MI.  Bleeding has stopped.  Nursing staff has wrapped in pressure dressing and new ace bandage.  Pt also c/o lightheadedness with walking today.  I thought this may be secondary to pain management w/ oxycodone but she is orthostatic on exam (systolic dropped from [id number] sitting to 106 standing).  Chem 8 sig for elevated BUN/Cr and stable hgb.  Will treat for dehydration.  Pt moved to acute exam room for IV fluids.  Will give a 2L bolus and then recheck orthostatics.  Dammen, PA-C to dispo.         Remer Macho, [place] 08/27/11 [phone]

## 2011-08-26 NOTE — ED Notes (Signed)
Pt seen here today for bleeding to rt foot- pt was sutured and taken home.  Pt c/o of being "light headed" today and told this the first visit.  Pt continues to state she feeling light headed when upright and moving.  Pt sitting in chair no complaints.  Bleeding controlled

## 2011-08-26 NOTE — ED Provider Notes (Signed)
History     CSN: [id number]  Arrival date & time 08/26/11  1121   First MD Initiated Contact with Patient 08/26/11 1212      Chief Complaint  Patient presents with  . Foot Injury    right    (Consider location/radiation/quality/duration/timing/severity/associated sxs/prior treatment) HPI Comments: Patient states she had a right knee next to [facility] by [facility], but sent her on April 15.  She did have a complication of postoperative bleeding 2 days ago.  She was seen by Dr. Felisa Bonier in the office to retract.  The foot and told to elevate as much as possible, but she could ambulate when necessary today.  She was making the bed when she noticed bleeding.  She did try calling the office, but got no response.  Her son called EMS who transported patient to the emergency room.  The history is provided by the patient.    Past Medical History  Diagnosis Date  . Hypertension   . Thyroid disease     hypo  . Arthritis     rheumatoid 2011 Dr. Charlestine Night  . CAD (coronary artery disease)   . Cervical disc disease   . Polymyalgia rheumatica     2011 Dr. Charlestine Night  . Anemia   . Hyperlipidemia     Past Surgical History  Procedure Date  . Coronary artery bypass graft   . Splenectomy   . Cardiac catheterization   . Cervical spine surgery     plate 2008 Dr. Saintclair Halsted  . Carpal tunnel release   . Cataract extraction   . Hernia repair     Family History  Problem Relation Age of Onset  . Hypertension    . Coronary artery disease    . Asthma Neg Hx   . Hypertension Mother   . Hypertension Father   . Colon cancer Neg Hx     History  Substance Use Topics  . Smoking status: Former Research scientist (life sciences)  . Smokeless tobacco: Not on file  . Alcohol Use: Yes    OB History    Grav Para Term Preterm Abortions TAB SAB Ect Mult Living                  Review of Systems  Skin: Positive for wound.  Neurological: Positive for weakness.    Allergies  Aspirin; Codeine; Nitroglycerin; and  Tramadol hcl  Home Medications   Current Outpatient Rx  Name Route Sig Dispense Refill  . ATORVASTATIN CALCIUM 20 MG PO TABS Oral Take 1 tablet (20 mg total) by mouth daily. 90 tablet 3  . BESIFLOXACIN HCL 0.6 % OP SUSP Left Eye Place 1 drop into the left eye 3 (three) times daily. She is using for 7 days for conjunctivitis.. She started on 08/23/11 and has not completed.    [name] CALCIUM 1200 PO Oral Take 2 tablets by mouth 2 (two) times daily.     . CEPHALEXIN 500 MG PO CAPS Oral Take 500 mg by mouth 2 (two) times daily. She is taking for 10 days. She started on 08/21/11 and has not completed. She has one more dose to take.    [name] CETIRIZINE HCL 10 MG PO TABS Oral Take 10 mg by mouth daily.    [name] CLOPIDOGREL BISULFATE 75 MG PO TABS Oral Take 1 tablet (75 mg total) by mouth daily. 90 tablet 3  . LEVOTHYROXINE SODIUM 88 MCG PO TABS Oral Take 1 tablet (88 mcg total) by mouth daily. 90 tablet 3  .  LOSARTAN POTASSIUM 50 MG PO TABS Oral Take 25 mg by mouth daily.     [name] METOPROLOL SUCCINATE ER 25 MG PO TB24 Oral Take 1 tablet (25 mg total) by mouth daily. 90 tablet 3  . MULTIVITAMIN PO Oral Take 0.5 tablets by mouth daily.     [name] PREDNISONE 5 MG PO TABS Oral Take 5 mg by mouth every other day. She was taking two tablets every other day and has been weaning herself off of this medication. She started on 06/08/11. She has two more tablets left in her bottle and said that she is not going to take anymore.    [name] TRAMADOL HCL 50 MG PO TABS Oral Take 50-100 mg by mouth 2 (two) times daily as needed. For pain.    . OXYCODONE HCL 5 MG PO TABS Oral Take 1 tablet (5 mg total) by mouth every 6 (six) hours as needed for pain. 15 tablet 0    BP 119/49  Pulse 82  Temp(Src) 97.8 F (36.6 C) (Oral)  Resp 16  SpO2 100%  Physical Exam  Constitutional: She appears well-developed and well-nourished.  HENT:  Head: Normocephalic.  Neck: Normal range of motion.  Cardiovascular: Normal rate.   Pulmonary/Chest: Effort  normal.  Neurological: She is alert.  Skin: Skin is warm. There is pallor.       He has since bunionectomy wound, right foot actively bleeding    ED Course  LACERATION REPAIR Date/Time: 08/26/2011 12:40 PM Performed by: Garald Balding Authorized by: Garald Balding Consent: Verbal consent obtained. Risks and benefits: risks, benefits and alternatives were discussed Consent given by: patient Patient identity confirmed: verbally with patient Body area: lower extremity Location details: right foot Laceration length: 2 cm Tendon involvement: none Nerve involvement: none Vascular damage: no Anesthesia: local infiltration Local anesthetic: lidocaine 1% with epinephrine Irrigation solution: saline Amount of cleaning: extensive Debridement: none Degree of undermining: none Skin closure: 4-0 nylon Number of sutures: 7 Technique: simple Approximation: close Approximation difficulty: simple Dressing: pressure dressing Patient tolerance: Patient tolerated the procedure well with no immediate complications.   (including critical care time)  Labs Reviewed  CBC - Abnormal; Notable for the following:    WBC 13.1 (*)    RBC 3.48 (*)    Hemoglobin 11.0 (*)    HCT 32.9 (*)    All other components within normal limits   No results found.   1. Postoperative external wound disruption       MDM  Dehiscent 7 bunionectomy wound 10 days postoperatively.  The area has been resutured.  Pressure dressing applied.  No further bleeding noted.  I did speak with Dr. Felisa Bonier who will see her in the office first thing Monday morning.  Patient is to stop her Plavix until that time.  Keep the foot elevated as much as possible.  Limited ambulation to bathroom only        Garald Balding, NP 08/26/11 1405

## 2011-08-27 MED ORDER — HYDROCODONE-ACETAMINOPHEN 5-325 MG PO TABS: 1.0000 | ORAL_TABLET | ORAL | Status: AC | PRN

## 2011-08-27 MED ORDER — SODIUM CHLORIDE 0.9 % IV BOLUS (SEPSIS)
1000.0000 mL | Freq: Once | INTRAVENOUS | Status: AC
  Administered 2011-08-27: 1000 mL via INTRAVENOUS

## 2011-08-27 NOTE — ED Provider Notes (Signed)
[name] S Patient discussed in sign out with Barton Dubois, PA-C. Patient with orthostatic changes. She is receiving IV fluids and recheck. Patient did have a recent bunionectomy wound and dressing were evaluated. Patient had a new pressure dressing applied there is no bleeding. Patient has better orthostatic blood pressure and asymptomatic she may return home.    [name], [place] 08/27/11 [phone]

## 2011-08-27 NOTE — ED Provider Notes (Signed)
Medical screening examination/treatment/procedure(s) were performed by non-physician practitioner and as supervising physician I was immediately available for consultation/collaboration.  Teressa Lower, MD 08/27/11 [phone]

## 2011-08-27 NOTE — ED Provider Notes (Signed)
Medical screening examination/treatment/procedure(s) were performed by non-physician practitioner and as supervising physician I was immediately available for consultation/collaboration.  Chauncy Passy, MD 08/27/11 1008

## 2011-08-27 NOTE — Discharge Instructions (Signed)
Take vicodin as prescribed for severe pain.   Do not drive within four hours of taking this medication (may cause drowsiness or confusion).  Drink plenty of fluids when you return home.  If your bleeding starts again, elevate foot and apply pressure for five-ten minutes.  Follow up with your podiatrist as well as your family doctor on Monday.  You should return to the ER if you are unable to control bleeding, if you have persistent lightheadedness or Dehydration, Adult Dehydration means your body does not have as much fluid as it needs. Your kidneys, brain, and heart will not work properly without the right amount of fluids and salt.  HOME CARE  Ask your doctor how to replace body fluid losses (rehydrate).   Drink enough fluids to keep your pee (urine) clear or pale yellow.   Drink small amounts of fluids often if you feel sick to your stomach (nauseous) or throw up (vomit).   Eat like you normally do.   Avoid:   Foods or drinks high in sugar.   Bubbly (carbonated) drinks.   Juice.   Very hot or cold fluids.   Drinks with caffeine.   Fatty, greasy foods.   Alcohol.   Tobacco.   Eating too much.   Gelatin desserts.   Wash your hands to avoid spreading germs (bacteria, viruses).   Only take medicine as told by your doctor.   Keep all doctor visits as told.  GET HELP RIGHT AWAY IF:   You cannot drink something without throwing up.   You get worse even with treatment.   Your vomit has blood in it or looks greenish.   Your poop (stool) has blood in it or looks black and tarry.   You have not peed in 6 to 8 hours.   You pee a small amount of very dark pee.   You have a fever.   You pass out (faint).   You have belly (abdominal) pain that gets worse or stays in one spot (localizes).   You have a rash, stiff neck, or bad headache.   You get easily annoyed, sleepy, or are hard to wake up.   You feel weak, dizzy, or very thirsty.  MAKE SURE YOU:   Understand  these instructions.   Will watch your condition.   Will get help right away if you are not doing well or get worse.  Document Released: 02/11/2009 Document Revised: 04/06/2011 Document Reviewed: 12/05/2010 Exit[employer] Patient Information 2012 [place], [place] a passing out spell.

## 2011-08-27 NOTE — ED Provider Notes (Signed)
Medical screening examination/treatment/procedure(s) were conducted as a shared visit with non-physician practitioner(s) and myself.  I personally evaluated the patient during the encounter  Leota Jacobsen, MD 08/27/11 [phone]

## 2011-08-27 NOTE — ED Notes (Signed)
Pt returned to ED after being d/c earlier. c/o dizziness at home. Unable to get around at home.

## 2011-10-03 ENCOUNTER — Other Ambulatory Visit (HOSPITAL_COMMUNITY): Payer: Self-pay | Admitting: Obstetrics

## 2011-10-03 DIAGNOSIS — Z1231 Encounter for screening mammogram for malignant neoplasm of breast: Secondary | ICD-10-CM

## 2011-11-16 ENCOUNTER — Encounter: Payer: Self-pay | Admitting: Internal Medicine

## 2011-11-16 ENCOUNTER — Ambulatory Visit (INDEPENDENT_AMBULATORY_CARE_PROVIDER_SITE_OTHER): Payer: Medicare Other | Admitting: Internal Medicine

## 2011-11-16 VITALS — BP 128/72 | HR 64 | Temp 97.8°F | Resp 16 | Wt 156.0 lb

## 2011-11-16 DIAGNOSIS — E039 Hypothyroidism, unspecified: Secondary | ICD-10-CM

## 2011-11-16 DIAGNOSIS — I2510 Atherosclerotic heart disease of native coronary artery without angina pectoris: Secondary | ICD-10-CM

## 2011-11-16 DIAGNOSIS — I10 Essential (primary) hypertension: Secondary | ICD-10-CM

## 2011-11-16 DIAGNOSIS — M21619 Bunion of unspecified foot: Secondary | ICD-10-CM

## 2011-11-16 DIAGNOSIS — M21611 Bunion of right foot: Secondary | ICD-10-CM

## 2011-11-16 DIAGNOSIS — Q8909 Congenital malformations of spleen: Secondary | ICD-10-CM

## 2011-11-16 MED ORDER — CLOPIDOGREL BISULFATE 75 MG PO TABS: 75.0000 mg | ORAL_TABLET | Freq: Every day | ORAL | Status: DC

## 2011-11-16 MED ORDER — ATORVASTATIN CALCIUM 20 MG PO TABS: 20.0000 mg | ORAL_TABLET | Freq: Every day | ORAL | Status: DC

## 2011-11-16 MED ORDER — LEVOTHYROXINE SODIUM 88 MCG PO TABS: 88.0000 ug | ORAL_TABLET | Freq: Every day | ORAL | Status: DC

## 2011-11-16 MED ORDER — LOSARTAN POTASSIUM 50 MG PO TABS: 25.0000 mg | ORAL_TABLET | Freq: Every day | ORAL | Status: DC

## 2011-11-16 MED ORDER — METOPROLOL SUCCINATE ER 25 MG PO TB24: 25.0000 mg | ORAL_TABLET | Freq: Every day | ORAL | Status: DC

## 2011-11-16 NOTE — Assessment & Plan Note (Signed)
Continue with current prescription therapy as reflected on the Med list.  

## 2011-11-16 NOTE — Assessment & Plan Note (Signed)
Long term (s/p splenectomy) Augmentin prn

## 2011-11-16 NOTE — Progress Notes (Signed)
  Subjective:    Patient ID: [name], female    DOB: 05-Aug-1940, 71 y.o.   MRN: [id number]  HPI    The patient presents for a follow-up of  chronic hypertension, chronic dyslipidemia, CAD, RA controlled with medicines  C/o R foot/bunion pain - better after surgery C/o L ankle pain - in a boot now for tendonitis  BP Readings from Last 3 Encounters:  11/16/11 128/72  08/27/11 143/53  08/26/11 119/49   Wt Readings from Last 3 Encounters:  11/16/11 156 lb (70.761 kg)  08/26/11 155 lb (70.308 kg)  08/21/11 156 lb 9.6 oz (71.033 kg)     Review of Systems  Constitutional: Positive for fatigue. Negative for chills, activity change, appetite change and unexpected weight change.  HENT: Negative for congestion, mouth sores and sinus pressure.   Eyes: Negative for visual disturbance.  Respiratory: Negative for cough and chest tightness.   Cardiovascular: Negative for chest pain.  Gastrointestinal: Negative for nausea and abdominal pain.  Genitourinary: Negative for frequency, difficulty urinating and vaginal pain.  Musculoskeletal: Positive for back pain and arthralgias. Negative for joint swelling and gait problem.  Skin: Negative for pallor and rash.  Neurological: Negative for dizziness, tremors, weakness, numbness and headaches.  Psychiatric/Behavioral: Negative for confusion and disturbed wake/sleep cycle.       Objective:   Physical Exam  Constitutional: She appears well-developed and well-nourished. No distress.  HENT:  Head: Normocephalic.  Right Ear: External ear normal.  Left Ear: External ear normal.  Nose: Nose normal.  Mouth/Throat: Oropharynx is clear and moist.  Eyes: Conjunctivae are normal. Pupils are equal, round, and reactive to light. Right eye exhibits no discharge. Left eye exhibits no discharge.  Neck: Normal range of motion. Neck supple. No JVD present. No tracheal deviation present. No thyromegaly present.  Cardiovascular: Normal rate, regular  rhythm and normal heart sounds.   Pulmonary/Chest: No stridor. No respiratory distress. She has no wheezes.  Abdominal: Soft. Bowel sounds are normal. She exhibits no distension and no mass. There is no tenderness. There is no rebound and no guarding.  Musculoskeletal: She exhibits no edema and no tenderness.  Lymphadenopathy:    She has no cervical adenopathy.  Neurological: She displays normal reflexes. No cranial nerve deficit. She exhibits normal muscle tone. Coordination normal.  Skin: No rash noted. No erythema.  Psychiatric: She has a normal mood and affect. Her behavior is normal. Judgment and thought content normal.   L LE in a high boot     Lab Results  Component Value Date   WBC 13.1* 08/26/2011   HGB 11.6* 08/26/2011   HCT 34.0* 08/26/2011   PLT 344 08/26/2011   GLUCOSE 126* 08/26/2011   CHOL 171 06/21/2010   TRIG 118.0 06/21/2010   HDL 71.90 06/21/2010   LDLDIRECT 162.1 10/28/2009   LDLCALC 76 06/21/2010   ALT 18 06/21/2010   AST 21 06/21/2010   NA 138 08/26/2011   K 5.0 08/26/2011   CL 104 08/26/2011   CREATININE 1.60* 08/26/2011   BUN 30* 08/26/2011   CO2 31 06/21/2010   TSH 2.00 06/21/2010   INR 1.0 11/29/2006      Assessment & Plan:

## 2011-11-16 NOTE — Assessment & Plan Note (Signed)
Better  

## 2011-11-17 ENCOUNTER — Ambulatory Visit (HOSPITAL_COMMUNITY)
Admission: RE | Admit: 2011-11-17 | Discharge: 2011-11-17 | Disposition: A | Discharge status: Home / Self Care | Payer: Medicare Other | Source: Ambulatory Visit | Attending: Obstetrics | Admitting: Obstetrics

## 2011-11-17 DIAGNOSIS — Z1231 Encounter for screening mammogram for malignant neoplasm of breast: Secondary | ICD-10-CM | POA: Insufficient documentation

## 2012-01-10 ENCOUNTER — Telehealth: Payer: Self-pay | Admitting: *Deleted

## 2012-01-10 NOTE — Telephone Encounter (Signed)
Pt needs oral surgery. They want to know if she can come off Plavix for 5 days prior?

## 2012-01-12 NOTE — Telephone Encounter (Signed)
Can it be done on Plavis? Thx

## 2012-01-15 NOTE — Telephone Encounter (Signed)
No, they need her off x 5 days. I think you have ok'd this in the past. Is it ok this time?

## 2012-01-15 NOTE — Telephone Encounter (Signed)
OK. The cardiovascular risk is very small. Thank you!

## 2012-01-16 NOTE — Telephone Encounter (Signed)
Office informed. Fax note to [place] at [phone]

## 2012-01-17 NOTE — Telephone Encounter (Signed)
Letter printed and faxed

## 2012-01-18 ENCOUNTER — Other Ambulatory Visit: Payer: Self-pay | Admitting: Surgery

## 2012-01-19 ENCOUNTER — Encounter: Payer: Self-pay | Admitting: Surgery

## 2012-01-19 LAB — BUN: BUN: 32 mg/dL — ABNORMAL HIGH (ref 6–23)

## 2012-01-19 LAB — CREATININE, SERUM: Creat: 1.4 mg/dL — ABNORMAL HIGH (ref 0.50–1.10)

## 2012-01-22 ENCOUNTER — Ambulatory Visit (INDEPENDENT_AMBULATORY_CARE_PROVIDER_SITE_OTHER): Payer: Medicare Other | Admitting: Surgery

## 2012-01-22 ENCOUNTER — Encounter: Payer: Self-pay | Admitting: Surgery

## 2012-01-22 ENCOUNTER — Ambulatory Visit
Admission: RE | Admit: 2012-01-22 | Discharge: 2012-01-22 | Disposition: A | Discharge status: Home / Self Care | Payer: Medicare Other | Source: Ambulatory Visit | Attending: Surgery | Admitting: Surgery

## 2012-01-22 ENCOUNTER — Other Ambulatory Visit: Payer: Medicare Other

## 2012-01-22 VITALS — BP 151/73 | HR 72 | Resp 16 | Ht 63.0 in | Wt 160.2 lb

## 2012-01-22 DIAGNOSIS — I771 Stricture of artery: Secondary | ICD-10-CM

## 2012-01-22 DIAGNOSIS — I729 Aneurysm of unspecified site: Secondary | ICD-10-CM

## 2012-01-22 DIAGNOSIS — I728 Aneurysm of other specified arteries: Secondary | ICD-10-CM

## 2012-01-22 DIAGNOSIS — I70209 Unspecified atherosclerosis of native arteries of extremities, unspecified extremity: Secondary | ICD-10-CM

## 2012-01-22 MED ORDER — IOHEXOL 350 MG/ML SOLN
75.0000 mL | Freq: Once | INTRAVENOUS | Status: AC | PRN
  Administered 2012-01-22: 75 mL via INTRAVENOUS

## 2012-01-22 NOTE — Addendum Note (Signed)
Addended by: Mena Goes on: 01/22/2012 01:17 PM   Modules accepted: Orders

## 2012-01-22 NOTE — Progress Notes (Signed)
[facility] [place]   Patient name: [name] MRN: [id number] DOB: 06-19-1940 Sex: female     Chief Complaint  Patient presents with  . New Evaluation    mesenteric artery aneurysm, last seen 01/17/2010    HISTORY OF PRESENT ILLNESS: The patient is back today for followup. I inhibited her from Dr. Amedeo Plenty in 2011. 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 Leafy Kindle in 1996 for rupture. She has been followed with MR A. since that time to evaluate her mesenteric and splenic aneurysms. 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 She has been treated in the past with steroids for polyarteritis or fibromuscular disease. Currently she is not on steroids. She denies abdominal pain. She denies postprandial pain.  Past Medical History  Diagnosis Date  . Hypertension   . Thyroid disease     hypo  . Arthritis     rheumatoid 2011 Dr. Charlestine Night  . CAD (coronary artery disease)   . Cervical disc disease   . Polymyalgia rheumatica     2011 Dr. Charlestine Night  . Anemia   . Hyperlipidemia     Past Surgical History  Procedure Date  . Coronary artery bypass graft   . Splenectomy   . Cardiac catheterization   . Cervical spine surgery     plate 2008 Dr. Saintclair Halsted  . Carpal tunnel release   . Cataract extraction   . Hernia repair   . Bunionectomy 07/2011    right foot    History   Social History  . Marital Status: Married    Spouse Name: N/A    Number of Children: 4  . Years of Education: N/A   Occupational History  . retired    Social History Main Topics  . Smoking status: Former Research scientist (life sciences)  . Smokeless tobacco: Not on file  . Alcohol Use: 1.0 - 1.5 oz/week    2-3 drink(s) per week  . Drug Use: No  . Sexually Active: Yes   Other Topics Concern  . Not on file   Social History Narrative   [facility]    Family History    Problem Relation Age of Onset  . Hypertension    . Coronary artery disease    . Asthma Neg Hx   . Hypertension Mother   . Hypertension Father   . Colon cancer Neg Hx     Allergies as of 01/22/2012 - Review Complete 01/22/2012  Allergen Reaction Noted  . Aspirin Other (See Comments)   . Codeine Nausea And Vomiting   . Nitroglycerin Other (See Comments) 11/08/2010  . Tramadol hcl  03/23/2008    Current Outpatient Prescriptions on File Prior to Visit  Medication Sig Dispense Refill  . Besifloxacin HCl (BESIVANCE) 0.6 % SUSP Place 1 drop into the left eye 3 (three) times daily. She is using for 7 days for conjunctivitis.. She started on 08/23/11 and has not completed.      . Calcium Carbonate-Vit D-Min (CALCIUM 1200 PO) Take 2 tablets by mouth 2 (two) times daily.       . cetirizine (ZYRTEC) 10 MG tablet Take 10 mg by mouth daily.      . clopidogrel (PLAVIX) 75 MG tablet Take 1 tablet (75 mg total) by mouth daily.  90 tablet  3  . levothyroxine (LEVOXYL) 88 MCG tablet Take 1 tablet (88 mcg total) by  mouth daily.  90 tablet  3  . losartan (COZAAR) 50 MG tablet Take 0.5 tablets (25 mg total) by mouth daily.  90 tablet  3  . metoprolol succinate (TOPROL-XL) 25 MG 24 hr tablet Take 1 tablet (25 mg total) by mouth daily.  90 tablet  3  . Multiple Vitamin (MULTIVITAMIN PO) Take 0.5 tablets by mouth daily.       [name] atorvastatin (LIPITOR) 20 MG tablet Take 1 tablet (20 mg total) by mouth daily.  90 tablet  3  . cephALEXin (KEFLEX) 500 MG capsule Take 500 mg by mouth 2 (two) times daily. She is taking for 10 days. She started on 08/21/11 and has not completed. She has one more dose to take.      . predniSONE (DELTASONE) 5 MG tablet Take 5 mg by mouth every other day. She was taking two tablets every other day and has been weaning herself off of this medication. She started on 06/08/11. She has two more tablets left in her bottle and said that she is not going to take anymore.      . traMADol (ULTRAM)  50 MG tablet Take 50-100 mg by mouth 2 (two) times daily as needed. For pain.       No current facility-administered medications on file prior to visit.     REVIEW OF SYSTEMS: Positive for total body aches and varicose veins. Otherwise all systems are negative as documented by the patient and the encounter form.  PHYSICAL EXAMINATION:   Vital signs are BP 151/73  Pulse 72  Resp 16  Ht 5\' 3"  (1.6 m)  Wt 160 lb 3.2 oz (72.666 kg)  BMI 28.38 kg/m2  SpO2 100% General: The patient appears their stated age. HEENT:  No gross abnormalities Pulmonary:  Non labored breathing Abdomen: Soft and non-tender. No hernia. Musculoskeletal: There are no major deformities. Neurologic: No focal weakness or paresthesias are detected, Skin: There are no ulcer or rashes noted. Psychiatric: The patient has normal affect. Cardiovascular: There is a regular rate and rhythm without significant murmur appreciated. Pedal pulses are not palpable.   Diagnostic Studies I have reviewed her CT scan today. I do not see any changes from her MRI 2 years ago. The superior mesenteric artery remained small in caliber. 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  Assessment: Mesenteric aneurysm Plan: The patient  Has remained stable over the past 2 years. I will continue with imaging studies every other year. I will get a MRA in 2 years to evaluate for aneurysmal changes. I do not feel that ultrasound will be able to detect the potential small aneurysms that the patient is prone to have.  Eldridge Abrahams, M.D. [facility] [place] Office: [phone] Pager:  [phone]

## 2012-01-25 ENCOUNTER — Ambulatory Visit (INDEPENDENT_AMBULATORY_CARE_PROVIDER_SITE_OTHER): Payer: Medicare Other | Admitting: *Deleted

## 2012-01-25 ENCOUNTER — Encounter: Payer: Self-pay | Admitting: *Deleted

## 2012-01-25 VITALS — BP 118/70 | HR 72 | Temp 98.4°F | Resp 18

## 2012-01-25 DIAGNOSIS — Z23 Encounter for immunization: Secondary | ICD-10-CM

## 2012-01-25 NOTE — Addendum Note (Signed)
Addended by: Murtis Sink A on: 01/25/2012 10:47 AM   Modules accepted: Orders

## 2012-02-16 ENCOUNTER — Other Ambulatory Visit: Payer: Self-pay | Admitting: Obstetrics

## 2012-02-16 DIAGNOSIS — M069 Rheumatoid arthritis, unspecified: Secondary | ICD-10-CM

## 2012-02-16 DIAGNOSIS — IMO0002 Reserved for concepts with insufficient information to code with codable children: Secondary | ICD-10-CM

## 2012-02-16 DIAGNOSIS — Z780 Asymptomatic menopausal state: Secondary | ICD-10-CM

## 2012-03-21 ENCOUNTER — Ambulatory Visit (INDEPENDENT_AMBULATORY_CARE_PROVIDER_SITE_OTHER): Payer: Medicare Other | Admitting: Internal Medicine

## 2012-03-21 ENCOUNTER — Encounter: Payer: Self-pay | Admitting: Internal Medicine

## 2012-03-21 VITALS — BP 146/80 | HR 84 | Temp 98.4°F | Resp 16 | Wt 156.0 lb

## 2012-03-21 DIAGNOSIS — I2510 Atherosclerotic heart disease of native coronary artery without angina pectoris: Secondary | ICD-10-CM

## 2012-03-21 DIAGNOSIS — M2550 Pain in unspecified joint: Secondary | ICD-10-CM

## 2012-03-21 DIAGNOSIS — Z23 Encounter for immunization: Secondary | ICD-10-CM

## 2012-03-21 DIAGNOSIS — M069 Rheumatoid arthritis, unspecified: Secondary | ICD-10-CM

## 2012-03-21 DIAGNOSIS — Q8909 Congenital malformations of spleen: Secondary | ICD-10-CM

## 2012-03-21 DIAGNOSIS — N189 Chronic kidney disease, unspecified: Secondary | ICD-10-CM

## 2012-03-21 DIAGNOSIS — E039 Hypothyroidism, unspecified: Secondary | ICD-10-CM

## 2012-03-21 MED ORDER — MENINGOCOCCAL A C Y&W-135 CONJ IM INJ: 0.5000 mL | INJECTION | Freq: Once | INTRAMUSCULAR | Status: DC

## 2012-03-21 NOTE — Assessment & Plan Note (Signed)
Continue with current prescription therapy as reflected on the Med list.  

## 2012-03-21 NOTE — Progress Notes (Signed)
   Subjective:    Patient ID: [name], female    DOB: 1940-10-07, 71 y.o.   MRN: [id number]  HPI    The patient presents for a follow-up of  chronic hypertension, chronic dyslipidemia, CAD, RA controlled with medicines F/u R foot/bunion pain -well after surgery   BP Readings from Last 3 Encounters:  03/21/12 146/80  01/25/12 118/70  01/22/12 151/73   Wt Readings from Last 3 Encounters:  03/21/12 156 lb (70.761 kg)  01/22/12 160 lb 3.2 oz (72.666 kg)  11/16/11 156 lb (70.761 kg)     Review of Systems  Constitutional: Positive for fatigue. Negative for chills, activity change, appetite change and unexpected weight change.  HENT: Negative for congestion, mouth sores and sinus pressure.   Eyes: Negative for visual disturbance.  Respiratory: Negative for cough and chest tightness.   Cardiovascular: Negative for chest pain.  Gastrointestinal: Negative for nausea and abdominal pain.  Genitourinary: Negative for frequency, difficulty urinating and vaginal pain.  Musculoskeletal: Positive for back pain and arthralgias. Negative for joint swelling and gait problem.  Skin: Negative for pallor and rash.  Neurological: Negative for dizziness, tremors, weakness, numbness and headaches.  Psychiatric/Behavioral: Negative for confusion and sleep disturbance.       Objective:   Physical Exam  Constitutional: She appears well-developed and well-nourished. No distress.  HENT:  Head: Normocephalic.  Right Ear: External ear normal.  Left Ear: External ear normal.  Nose: Nose normal.  Mouth/Throat: Oropharynx is clear and moist.  Eyes: Conjunctivae normal are normal. Pupils are equal, round, and reactive to light. Right eye exhibits no discharge. Left eye exhibits no discharge.  Neck: Normal range of motion. Neck supple. No JVD present. No tracheal deviation present. No thyromegaly present.  Cardiovascular: Normal rate, regular rhythm and normal heart sounds.   Pulmonary/Chest: No  stridor. No respiratory distress. She has no wheezes.  Abdominal: Soft. Bowel sounds are normal. She exhibits no distension and no mass. There is no tenderness. There is no rebound and no guarding.  Musculoskeletal: She exhibits no edema and no tenderness.  Lymphadenopathy:    She has no cervical adenopathy.  Neurological: She displays normal reflexes. No cranial nerve deficit. She exhibits normal muscle tone. Coordination normal.  Skin: No rash noted. No erythema.  Psychiatric: She has a normal mood and affect. Her behavior is normal. Judgment and thought content normal.        Lab Results  Component Value Date   WBC 13.1* 08/26/2011   HGB 11.6* 08/26/2011   HCT 34.0* 08/26/2011   PLT 344 08/26/2011   GLUCOSE 126* 08/26/2011   CHOL 171 06/21/2010   TRIG 118.0 06/21/2010   HDL 71.90 06/21/2010   LDLDIRECT 162.1 10/28/2009   LDLCALC 76 06/21/2010   ALT 18 06/21/2010   AST 21 06/21/2010   NA 138 08/26/2011   K 5.0 08/26/2011   CL 104 08/26/2011   CREATININE 1.40* 01/18/2012   BUN 32* 01/18/2012   CO2 31 06/21/2010   TSH 2.00 06/21/2010   INR 1.0 11/29/2006      Assessment & Plan:

## 2012-03-21 NOTE — Assessment & Plan Note (Signed)
Long term (s/p splenectomy) Augmentin prn

## 2012-03-21 NOTE — Assessment & Plan Note (Signed)
Better  

## 2012-04-12 ENCOUNTER — Ambulatory Visit
Admission: RE | Admit: 2012-04-12 | Discharge: 2012-04-12 | Disposition: A | Discharge status: Home / Self Care | Payer: Medicare Other | Source: Ambulatory Visit | Attending: Obstetrics | Admitting: Obstetrics

## 2012-04-12 DIAGNOSIS — Z780 Asymptomatic menopausal state: Secondary | ICD-10-CM

## 2012-04-12 DIAGNOSIS — M069 Rheumatoid arthritis, unspecified: Secondary | ICD-10-CM

## 2012-04-12 DIAGNOSIS — IMO0002 Reserved for concepts with insufficient information to code with codable children: Secondary | ICD-10-CM

## 2012-05-28 ENCOUNTER — Telehealth: Payer: Self-pay | Admitting: Internal Medicine

## 2012-05-28 NOTE — Telephone Encounter (Signed)
Caller: Temica/Patient; Phone: [phone]; Reason for Call: Patient calling, has recently switched insurance companys and new scripts for a mail order pharmacy: Metroprolol and Levoxyl.   She will come by the office to pick up the end of this week, 05/31/12.

## 2012-05-29 MED ORDER — LEVOTHYROXINE SODIUM 88 MCG PO TABS: 88.0000 ug | ORAL_TABLET | Freq: Every day | ORAL | Status: DC

## 2012-05-29 MED ORDER — METOPROLOL SUCCINATE ER 25 MG PO TB24: 25.0000 mg | ORAL_TABLET | Freq: Every day | ORAL | Status: DC

## 2012-05-29 NOTE — Telephone Encounter (Signed)
Called pt wanting rx fax to [facility] @ [phone]...lmb

## 2012-07-18 ENCOUNTER — Encounter: Payer: Self-pay | Admitting: Internal Medicine

## 2012-07-18 ENCOUNTER — Ambulatory Visit (INDEPENDENT_AMBULATORY_CARE_PROVIDER_SITE_OTHER): Payer: Medicare PPO | Admitting: Internal Medicine

## 2012-07-18 VITALS — BP 130/80 | HR 84 | Temp 97.2°F | Wt 155.0 lb

## 2012-07-18 DIAGNOSIS — I10 Essential (primary) hypertension: Secondary | ICD-10-CM

## 2012-07-18 DIAGNOSIS — I2510 Atherosclerotic heart disease of native coronary artery without angina pectoris: Secondary | ICD-10-CM

## 2012-07-18 DIAGNOSIS — Z8639 Personal history of other endocrine, nutritional and metabolic disease: Secondary | ICD-10-CM

## 2012-07-18 DIAGNOSIS — Z862 Personal history of diseases of the blood and blood-forming organs and certain disorders involving the immune mechanism: Secondary | ICD-10-CM

## 2012-07-18 DIAGNOSIS — Q8909 Congenital malformations of spleen: Secondary | ICD-10-CM

## 2012-07-18 DIAGNOSIS — M069 Rheumatoid arthritis, unspecified: Secondary | ICD-10-CM

## 2012-07-18 DIAGNOSIS — E039 Hypothyroidism, unspecified: Secondary | ICD-10-CM

## 2012-07-18 MED ORDER — ATORVASTATIN CALCIUM 20 MG PO TABS: 20.0000 mg | ORAL_TABLET | Freq: Every day | ORAL | Status: DC

## 2012-07-18 MED ORDER — LOSARTAN POTASSIUM 50 MG PO TABS: 25.0000 mg | ORAL_TABLET | Freq: Every day | ORAL | Status: DC

## 2012-07-18 MED ORDER — CLOPIDOGREL BISULFATE 75 MG PO TABS: 75.0000 mg | ORAL_TABLET | Freq: Every day | ORAL | Status: DC

## 2012-07-18 NOTE — Assessment & Plan Note (Signed)
Continue with current prescription therapy as reflected on the Med list.  

## 2012-07-18 NOTE — Assessment & Plan Note (Signed)
Long term (s/p splenectomy) Augmentin prn

## 2012-07-18 NOTE — Progress Notes (Signed)
   Subjective:    HPI    The patient presents for a follow-up of  chronic hypertension, chronic dyslipidemia, CAD, RA controlled with medicines F/u R foot/bunion pain -well after surgery   BP Readings from Last 3 Encounters:  07/18/12 130/80  03/21/12 146/80  01/25/12 118/70   Wt Readings from Last 3 Encounters:  07/18/12 155 lb (70.308 kg)  03/21/12 156 lb (70.761 kg)  01/22/12 160 lb 3.2 oz (72.666 kg)     Review of Systems  Constitutional: Positive for fatigue. Negative for chills, activity change, appetite change and unexpected weight change.  HENT: Negative for congestion, mouth sores and sinus pressure.   Eyes: Negative for visual disturbance.  Respiratory: Negative for cough and chest tightness.   Cardiovascular: Negative for chest pain.  Gastrointestinal: Negative for nausea and abdominal pain.  Genitourinary: Negative for frequency, difficulty urinating and vaginal pain.  Musculoskeletal: Positive for back pain and arthralgias. Negative for joint swelling and gait problem.  Skin: Negative for pallor and rash.  Neurological: Negative for dizziness, tremors, weakness, numbness and headaches.  Psychiatric/Behavioral: Negative for confusion and sleep disturbance.       Objective:   Physical Exam  Constitutional: She appears well-developed and well-nourished. No distress.  HENT:  Head: Normocephalic.  Right Ear: External ear normal.  Left Ear: External ear normal.  Nose: Nose normal.  Mouth/Throat: Oropharynx is clear and moist.  Eyes: Conjunctivae are normal. Pupils are equal, round, and reactive to light. Right eye exhibits no discharge. Left eye exhibits no discharge.  Neck: Normal range of motion. Neck supple. No JVD present. No tracheal deviation present. No thyromegaly present.  Cardiovascular: Normal rate, regular rhythm and normal heart sounds.   Pulmonary/Chest: No stridor. No respiratory distress. She has no wheezes.  Abdominal: Soft. Bowel sounds are  normal. She exhibits no distension and no mass. There is no tenderness. There is no rebound and no guarding.  Musculoskeletal: She exhibits no edema and no tenderness.  Lymphadenopathy:    She has no cervical adenopathy.  Neurological: She displays normal reflexes. No cranial nerve deficit. She exhibits normal muscle tone. Coordination normal.  Skin: No rash noted. No erythema.  Psychiatric: She has a normal mood and affect. Her behavior is normal. Judgment and thought content normal.        Lab Results  Component Value Date   WBC 13.1* 08/26/2011   HGB 11.6* 08/26/2011   HCT 34.0* 08/26/2011   PLT 344 08/26/2011   GLUCOSE 126* 08/26/2011   CHOL 171 06/21/2010   TRIG 118.0 06/21/2010   HDL 71.90 06/21/2010   LDLDIRECT 162.1 10/28/2009   LDLCALC 76 06/21/2010   ALT 18 06/21/2010   AST 21 06/21/2010   NA 138 08/26/2011   K 5.0 08/26/2011   CL 104 08/26/2011   CREATININE 1.40* 01/18/2012   BUN 32* 01/18/2012   CO2 31 06/21/2010   TSH 2.00 06/21/2010   INR 1.0 11/29/2006      Assessment & Plan:

## 2012-07-29 ENCOUNTER — Other Ambulatory Visit: Payer: Self-pay

## 2012-07-29 MED ORDER — ATORVASTATIN CALCIUM 20 MG PO TABS: 20.0000 mg | ORAL_TABLET | Freq: Every day | ORAL | Status: DC

## 2012-07-29 NOTE — Progress Notes (Signed)
Pt called requesting a limited Rx of Lipitor sent to her local pharmacy while she waits for shipment of medication from mail order pharmacy.

## 2012-08-01 ENCOUNTER — Telehealth: Payer: Self-pay | Admitting: Internal Medicine

## 2012-08-01 MED ORDER — LOSARTAN POTASSIUM 25 MG PO TABS: 25.0000 mg | ORAL_TABLET | Freq: Every day | ORAL | Status: DC

## 2012-08-01 MED ORDER — CLOPIDOGREL BISULFATE 75 MG PO TABS: 75.0000 mg | ORAL_TABLET | Freq: Every day | ORAL | Status: DC

## 2012-08-01 NOTE — Telephone Encounter (Signed)
Patient Information:  Caller Name: Niya  Phone: [phone]  Patient: [name], [name]  Gender: Female  DOB: [birth year]  Age: 72 Years  PCP: Plotnikov, Alex (Adults only)  Office Follow Up:  Does the office need to follow up with this patient?: Yes  Instructions For The Office: Please order her medications from the current pharmacy listed under contact information notes in Epic.She states she takes 25 mg Losartan one tablet daily instead of 1/2 of 50 mg tablet.   Patient states she will confirm if this has been done by checking internet for availability.  Thank you.  RN Note:  Medications Ordered This Encounter      Disp Refills Start End    losartan (COZAAR) 50 MG tablet 90 tablet 3 07/18/2012      Take 0.5 tablets (25 mg total) by mouth daily. - Oral    clopidogrel (PLAVIX) 75 MG tablet 90 tablet 3 07/18/2012      Take 1 tablet (75 mg total) by mouth daily. - Oral    atorvastatin (LIPITOR) 20 MG tablet (Discontinued) 90 tablet 3 07/18/2012 07/29/2012    Take 1 tablet (20 mg total) by mouth daily. - Oral    Reason for Discontinue: Reorder  Symptoms  Reason For Call & Symptoms: Patient states her Rx has not reached pharmacy from visit 07/18/12.   See information in Notes.  Patient states she is on Losartan 25 mg one tablet daily instead of 1/2 50 mg tablet daily.  Caller relates that her pharmacy changed as of 05/01/12; Information included in Contact Information in Epic, but did not find that pharmacy on available listing.  Reviewed Health History In EMR: Yes  Reviewed Medications In EMR: Yes  Reviewed Allergies In EMR: Yes  Reviewed Surgeries / Procedures: Yes  Date of Onset of Symptoms: 07/18/2012  Guideline(s) Used:  No Protocol Available - Information Only  Disposition Per Guideline:   Discuss with PCP and Callback by Nurse Today  Reason For Disposition Reached:   Nursing judgment  Advice Given:  Call Back If:  New symptoms develop  You become worse.  Patient Will Follow  Care Advice:  YES

## 2012-08-01 NOTE — Telephone Encounter (Signed)
Notified pt rx sent. Pt stated she needing plavix too. Updated new mail service...Johny Chess

## 2012-08-02 ENCOUNTER — Other Ambulatory Visit: Payer: Self-pay | Admitting: *Deleted

## 2012-08-02 MED ORDER — ATORVASTATIN CALCIUM 20 MG PO TABS: 20.0000 mg | ORAL_TABLET | Freq: Every day | ORAL | Status: DC

## 2012-09-04 ENCOUNTER — Other Ambulatory Visit (INDEPENDENT_AMBULATORY_CARE_PROVIDER_SITE_OTHER): Payer: Medicare PPO

## 2012-09-04 ENCOUNTER — Ambulatory Visit (INDEPENDENT_AMBULATORY_CARE_PROVIDER_SITE_OTHER)
Admission: RE | Admit: 2012-09-04 | Discharge: 2012-09-04 | Disposition: A | Discharge status: Home / Self Care | Payer: Medicare PPO | Source: Ambulatory Visit | Attending: Internal Medicine | Admitting: Internal Medicine

## 2012-09-04 ENCOUNTER — Ambulatory Visit (INDEPENDENT_AMBULATORY_CARE_PROVIDER_SITE_OTHER): Payer: Medicare PPO | Admitting: Internal Medicine

## 2012-09-04 ENCOUNTER — Encounter: Payer: Self-pay | Admitting: Internal Medicine

## 2012-09-04 VITALS — BP 130/90 | HR 98 | Temp 97.5°F | Resp 16 | Wt 150.0 lb

## 2012-09-04 DIAGNOSIS — N189 Chronic kidney disease, unspecified: Secondary | ICD-10-CM

## 2012-09-04 DIAGNOSIS — K551 Chronic vascular disorders of intestine: Secondary | ICD-10-CM

## 2012-09-04 DIAGNOSIS — D510 Vitamin B12 deficiency anemia due to intrinsic factor deficiency: Secondary | ICD-10-CM | POA: Insufficient documentation

## 2012-09-04 DIAGNOSIS — E039 Hypothyroidism, unspecified: Secondary | ICD-10-CM

## 2012-09-04 DIAGNOSIS — R109 Unspecified abdominal pain: Secondary | ICD-10-CM | POA: Insufficient documentation

## 2012-09-04 DIAGNOSIS — I771 Stricture of artery: Secondary | ICD-10-CM

## 2012-09-04 LAB — URINALYSIS, ROUTINE W REFLEX MICROSCOPIC
Ketones, ur: NEGATIVE
Urine Glucose: NEGATIVE
Urobilinogen, UA: 0.2 (ref 0.0–1.0)

## 2012-09-04 LAB — CBC WITH DIFFERENTIAL/PLATELET
Basophils Absolute: 0.2 10*3/uL — ABNORMAL HIGH (ref 0.0–0.1)
Eosinophils Relative: 1.9 % (ref 0.0–5.0)
HCT: 41.6 % (ref 36.0–46.0)
Lymphocytes Relative: 15.9 % (ref 12.0–46.0)
Monocytes Relative: 11.8 % (ref 3.0–12.0)
Neutrophils Relative %: 69.1 % (ref 43.0–77.0)
Platelets: 309 10*3/uL (ref 150.0–400.0)
RDW: 14.6 % (ref 11.5–14.6)
WBC: 12.5 10*3/uL — ABNORMAL HIGH (ref 4.5–10.5)

## 2012-09-04 LAB — COMPREHENSIVE METABOLIC PANEL
AST: 20 U/L (ref 0–37)
BUN: 30 mg/dL — ABNORMAL HIGH (ref 6–23)
Calcium: 9.7 mg/dL (ref 8.4–10.5)
Chloride: 102 mEq/L (ref 96–112)
Creatinine, Ser: 1.8 mg/dL — ABNORMAL HIGH (ref 0.4–1.2)
Total Bilirubin: 0.6 mg/dL (ref 0.3–1.2)

## 2012-09-04 LAB — AMYLASE: Amylase: 134 U/L — ABNORMAL HIGH (ref 27–131)

## 2012-09-04 LAB — TSH: TSH: 1.11 u[IU]/mL (ref 0.35–5.50)

## 2012-09-04 LAB — SEDIMENTATION RATE: Sed Rate: 9 mm/hr (ref 0–22)

## 2012-09-04 LAB — LIPASE: Lipase: 56 U/L (ref 11.0–59.0)

## 2012-09-04 LAB — FERRITIN: Ferritin: 49.6 ng/mL (ref 10.0–291.0)

## 2012-09-04 MED ORDER — OMEPRAZOLE 40 MG PO CPDR: 40.0000 mg | DELAYED_RELEASE_CAPSULE | Freq: Every day | ORAL | Status: DC

## 2012-09-04 NOTE — Assessment & Plan Note (Addendum)
She is beginning to have suspicious symptoms so I have asked her to f/up with vascular surgery

## 2012-09-04 NOTE — Progress Notes (Signed)
Subjective:    Patient ID: [name], female    DOB: 04/02/1941, 72 y.o.   MRN: [id number]  Abdominal Pain This is a new problem. The current episode started in the past 7 days. The onset quality is gradual. The problem occurs intermittently. The problem has been unchanged. The pain is located in the generalized abdominal region. The pain is at a severity of 2/10. The pain is mild. The quality of the pain is colicky and aching. The abdominal pain does not radiate. Associated symptoms include diarrhea and weight loss. Pertinent negatives include no anorexia, arthralgias, belching, constipation, dysuria, fever, flatus, frequency, headaches, hematochezia, hematuria, melena, myalgias, nausea or vomiting. The pain is aggravated by eating. The pain is relieved by bowel movements. She has tried nothing for the symptoms. Her past medical history is significant for abdominal surgery. There is no history of colon cancer, Crohn's disease, gallstones, GERD, irritable bowel syndrome, pancreatitis, PUD or ulcerative colitis.      Review of Systems  Constitutional: Positive for weight loss and unexpected weight change (some weight loss). Negative for fever, chills, diaphoresis, activity change, appetite change and fatigue.  HENT: Negative.   Eyes: Negative.   Respiratory: Negative.   Cardiovascular: Negative.   Gastrointestinal: Positive for abdominal pain and diarrhea. Negative for nausea, vomiting, constipation, melena, hematochezia, abdominal distention, anal bleeding, rectal pain, anorexia and flatus.  Endocrine: Negative.   Genitourinary: Negative for dysuria, urgency, frequency, hematuria, difficulty urinating and pelvic pain.  Musculoskeletal: Negative.  Negative for myalgias, back pain, joint swelling, arthralgias and gait problem.  Skin: Negative.   Allergic/Immunologic: Negative.   Neurological: Negative.  Negative for headaches.  Hematological: Negative.  Negative for adenopathy. Does not  bruise/bleed easily.  Psychiatric/Behavioral: Negative.        Objective:   Physical Exam  Vitals reviewed. Constitutional: She is oriented to person, place, and time. She appears well-developed and well-nourished.  Non-toxic appearance. She does not have a sickly appearance. She does not appear ill. No distress.  HENT:  Head: Normocephalic and atraumatic.  Mouth/Throat: Oropharynx is clear and moist. No oropharyngeal exudate.  Eyes: Conjunctivae are normal. Right eye exhibits no discharge. Left eye exhibits no discharge. No scleral icterus.  Neck: Normal range of motion. Neck supple. No JVD present. No tracheal deviation present. No thyromegaly present.  Cardiovascular: Normal rate, regular rhythm, normal heart sounds and intact distal pulses.  Exam reveals no gallop and no friction rub.   No murmur heard. Pulmonary/Chest: Effort normal and breath sounds normal. No stridor. No respiratory distress. She has no wheezes. She has no rales. She exhibits no tenderness.  Abdominal: Soft. Normal appearance. She exhibits no shifting dullness, no distension, no pulsatile liver, no fluid wave, no abdominal bruit, no ascites, no pulsatile midline mass and no mass. Bowel sounds are increased. There is no hepatosplenomegaly, splenomegaly or hepatomegaly. There is tenderness in the epigastric area. There is no rigidity, no rebound, no guarding, no CVA tenderness, no tenderness at McBurney's point and negative Murphy's sign. Hernia confirmed negative in the ventral area, confirmed negative in the right inguinal area and confirmed negative in the left inguinal area.    Genitourinary: Rectal exam shows external hemorrhoid. Rectal exam shows no internal hemorrhoid, no fissure, no mass, no tenderness and anal tone normal. Guaiac negative stool.  Musculoskeletal: Normal range of motion. She exhibits no edema and no tenderness.  Lymphadenopathy:    She has no cervical adenopathy.  Neurological: She is oriented to  person, place, and time.  Skin: Skin is warm and dry. No rash noted. She is not diaphoretic. No erythema. No pallor.  Psychiatric: She has a normal mood and affect. Her behavior is normal. Judgment and thought content normal.     Lab Results  Component Value Date   WBC 13.1* 08/26/2011   HGB 11.6* 08/26/2011   HCT 34.0* 08/26/2011   PLT 344 08/26/2011   GLUCOSE 126* 08/26/2011   CHOL 171 06/21/2010   TRIG 118.0 06/21/2010   HDL 71.90 06/21/2010   LDLDIRECT 162.1 10/28/2009   LDLCALC 76 06/21/2010   ALT 18 06/21/2010   AST 21 06/21/2010   NA 138 08/26/2011   K 5.0 08/26/2011   CL 104 08/26/2011   CREATININE 1.40* 01/18/2012   BUN 32* 01/18/2012   CO2 31 06/21/2010   TSH 2.00 06/21/2010   INR 1.0 11/29/2006       Assessment & Plan:

## 2012-09-04 NOTE — Patient Instructions (Signed)
Abdominal Pain  Abdominal pain can be caused by many things. Your caregiver decides the seriousness of your pain by an examination and possibly blood tests and X-rays. Many cases can be observed and treated at home. Most abdominal pain is not caused by a disease and will probably improve without treatment. However, in many cases, more time must pass before a clear cause of the pain can be found. Before that point, it may not be known if you need more testing, or if hospitalization or surgery is needed.  HOME CARE INSTRUCTIONS   · Do not take laxatives unless directed by your caregiver.  · Take pain medicine only as directed by your caregiver.  · Only take over-the-counter or prescription medicines for pain, discomfort, or fever as directed by your caregiver.  · Try a clear liquid diet (broth, tea, or water) for as long as directed by your caregiver. Slowly move to a bland diet as tolerated.  SEEK IMMEDIATE MEDICAL CARE IF:   · The pain does not go away.  · You have a fever.  · You keep throwing up (vomiting).  · The pain is felt only in portions of the abdomen. Pain in the right side could possibly be appendicitis. In an adult, pain in the left lower portion of the abdomen could be colitis or diverticulitis.  · You pass bloody or black tarry stools.  MAKE SURE YOU:   · Understand these instructions.  · Will watch your condition.  · Will get help right away if you are not doing well or get worse.  Document Released: 01/25/2005 Document Revised: 07/10/2011 Document Reviewed: 12/04/2007  Exit[employer]® Patient Information ©2013 [employer].

## 2012-09-05 ENCOUNTER — Telehealth: Payer: Self-pay

## 2012-09-05 ENCOUNTER — Encounter: Payer: Self-pay | Admitting: Internal Medicine

## 2012-09-05 NOTE — Telephone Encounter (Signed)
Pt. Called office to report having abdominal pain for approx. 1 wk.  States she thought this was r/t  taking Aleve; has since stopped Aleve, but continues to have the abdominal pain.  Describes that the pain in in the mid-abdomen and causes her to double-over in pain; estimates the episodes last about 10 minutes.  Denies nausea or vomiting, fevers/chills, or abdominal distension. States she has episodes of diarrhea following the abdominal pain.  Saw her PCP recently, and was advised to call and schedule appt. with Dr. Trula Slade.  Pt. States she is scheduled to go on a 2 wk. Trip at end of May, and is requesting to be seen prior to leaving.  Discussed pt's. Symptoms with Dr. Oneida Alar.  Recommends to schedule appt. For MRA Abd/ Pelvis with contrast and to see Dr. Trula Slade.  Advised if pt. has worsening episode of abdominal pain, should report to the ER.  Notified pt. about above recommendations.  Verb. Understanding.

## 2012-09-05 NOTE — Assessment & Plan Note (Signed)
Her renal function has very slightly worsened I have asked her to stop taking nsaids

## 2012-09-05 NOTE — Assessment & Plan Note (Signed)
I will recheck her TSH today 

## 2012-09-05 NOTE — Assessment & Plan Note (Signed)
I will recheck her CBC and her vitamin levels today

## 2012-09-05 NOTE — Assessment & Plan Note (Signed)
She does not appear to have an acute abd process, she has a history of disease in her mesenteric vessels and my main concern is that she is beginning to have symptoms related to this so I have asked her to see her vascular surgeon Today I will check her labs to look for organic abd disease She takes nsaids so I have asked her to start a PPI to prevent PUD I will check plain films today to see if she has a SBO, ileus, free air, etc

## 2012-09-06 ENCOUNTER — Encounter: Payer: Self-pay | Admitting: Internal Medicine

## 2012-09-06 ENCOUNTER — Telehealth: Payer: Self-pay | Admitting: Surgery

## 2012-09-06 ENCOUNTER — Other Ambulatory Visit: Payer: Self-pay | Admitting: *Deleted

## 2012-09-06 DIAGNOSIS — R1084 Generalized abdominal pain: Secondary | ICD-10-CM

## 2012-09-06 DIAGNOSIS — I728 Aneurysm of other specified arteries: Secondary | ICD-10-CM

## 2012-09-06 NOTE — Telephone Encounter (Signed)
-----   Message from Sherrye Payor, RN sent at 09/05/2012 5:05 PM ----- Needs appt. For MRA abd/ pelvis and appt. With Dr. Trula Slade.; c/o 1 wk. hx. of abdominal pain; hx of mesenteric and splenic aneurysms/ going on vacation 5/31; asking about possibility of being seen prior to her trip.(told her couldn't promise that, but will look at options) cell # [phone] (has appt. at 11:30 AM 5/9, so will need to contact on cell if not at home)   Notified patient of MRA appt. On 09-12-12 10am and then fu with dr. Trula Slade on 09-16-12 9:45

## 2012-09-11 ENCOUNTER — Other Ambulatory Visit: Payer: Self-pay | Admitting: *Deleted

## 2012-09-11 ENCOUNTER — Other Ambulatory Visit: Payer: Self-pay | Admitting: Surgery

## 2012-09-11 DIAGNOSIS — I728 Aneurysm of other specified arteries: Secondary | ICD-10-CM

## 2012-09-12 ENCOUNTER — Other Ambulatory Visit: Payer: Self-pay | Admitting: Surgery

## 2012-09-12 ENCOUNTER — Ambulatory Visit (HOSPITAL_COMMUNITY)
Admission: RE | Admit: 2012-09-12 | Discharge: 2012-09-12 | Disposition: A | Discharge status: Home / Self Care | Payer: Medicare PPO | Source: Ambulatory Visit | Attending: Surgery | Admitting: Surgery

## 2012-09-12 ENCOUNTER — Other Ambulatory Visit: Payer: Self-pay | Admitting: *Deleted

## 2012-09-12 DIAGNOSIS — I771 Stricture of artery: Secondary | ICD-10-CM | POA: Insufficient documentation

## 2012-09-12 DIAGNOSIS — I728 Aneurysm of other specified arteries: Secondary | ICD-10-CM

## 2012-09-12 DIAGNOSIS — Q619 Cystic kidney disease, unspecified: Secondary | ICD-10-CM | POA: Insufficient documentation

## 2012-09-13 ENCOUNTER — Encounter: Payer: Self-pay | Admitting: Surgery

## 2012-09-16 ENCOUNTER — Encounter: Payer: Self-pay | Admitting: Internal Medicine

## 2012-09-16 ENCOUNTER — Ambulatory Visit (INDEPENDENT_AMBULATORY_CARE_PROVIDER_SITE_OTHER): Payer: Medicare PPO | Admitting: Surgery

## 2012-09-16 ENCOUNTER — Encounter: Payer: Self-pay | Admitting: Surgery

## 2012-09-16 VITALS — BP 128/68 | HR 91 | Resp 16 | Ht 63.5 in | Wt 150.0 lb

## 2012-09-16 DIAGNOSIS — I728 Aneurysm of other specified arteries: Secondary | ICD-10-CM

## 2012-09-16 NOTE — Progress Notes (Signed)
[facility] [place]   Patient name: [name] MRN: [id number] DOB: 01-06-1941 Sex: female     Chief Complaint  Patient presents with  . Aneurysm    F/up  pt had abdominal pain,  Splenic Artery Aneurysm    HISTORY OF PRESENT ILLNESS: The patient is back today because she recently underwent a workup for abdominal pain. I inhibited her from Dr. Amedeo Plenty in 2011. 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 Leafy Kindle in 1996 for rupture. She has been followed with MR A. since that time to evaluate her mesenteric and splenic aneurysms. 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 She has been treated in the past with steroids for polyarteritis or fibromuscular disease. Currently she is not on steroids.    The patient states that she recently began having epigastric pain as well as diarrhea. She has been taking Prilosec which has helped. In addition she is on a bland diet. She has had weight loss of a few pounds given the change in her diet. Her symptoms have improved. 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 Hypertension   . Thyroid disease     hypo  . Arthritis     rheumatoid 2011 Dr. Charlestine Night  . CAD (coronary artery disease)   . Cervical disc disease   . Polymyalgia rheumatica     2011 Dr. Charlestine Night  . Anemia   . Hyperlipidemia     Past Surgical History  Procedure Laterality Date  . Coronary artery bypass graft    . Splenectomy    . Cardiac catheterization    . Cervical spine surgery      plate 2008 Dr. Saintclair Halsted  . Carpal tunnel release    . Cataract extraction    . Hernia repair    . Bunionectomy  07/2011    right foot    History   Social History  . Marital Status: Married    Spouse Name: N/A    Number of Children: 4   . Years of Education: N/A   Occupational History  . retired    Social History Main Topics  . Smoking status: Former Research scientist (life sciences)  . Smokeless tobacco: Never Used  . Alcohol Use: 1.5 oz/week    3 drink(s) per week  . Drug Use: No  . Sexually Active: Yes   Other Topics Concern  . Not on file   Social History Narrative   Regular [facility]- [facility]    Family History  Problem Relation Age of Onset  . Hypertension    . Coronary artery disease    . Asthma Neg Hx   . Hypertension Mother   . Hypertension Father   . Colon cancer Neg Hx     Allergies as of 09/16/2012 - Review Complete 09/16/2012  Allergen Reaction Noted  . Aspirin Other (See Comments)   . Codeine Nausea And Vomiting   . Nitroglycerin Other (See Comments) 11/08/2010  . Tramadol hcl  03/23/2008    Current Outpatient Prescriptions on File Prior to Visit  Medication Sig Dispense Refill  . atorvastatin (LIPITOR) 20 MG tablet Take 1 tablet (20 mg total) by mouth daily.  90 tablet  3  . Calcium Carbonate-Vit D-Min (CALCIUM 1200 PO) Take 2 tablets by mouth  2 (two) times daily.       . clopidogrel (PLAVIX) 75 MG tablet Take 1 tablet (75 mg total) by mouth daily.  90 tablet  3  . leflunomide (ARAVA) 20 MG tablet Take 20 mg by mouth daily.      [name] levothyroxine (LEVOXYL) 88 MCG tablet Take 1 tablet (88 mcg total) by mouth daily.  90 tablet  3  . losartan (COZAAR) 25 MG tablet Take 1 tablet (25 mg total) by mouth daily.  90 tablet  3  . metoprolol succinate (TOPROL-XL) 25 MG 24 hr tablet Take 1 tablet (25 mg total) by mouth daily.  90 tablet  3  . omeprazole (PRILOSEC) 40 MG capsule Take 1 capsule (40 mg total) by mouth daily.  50 capsule  0  . Besifloxacin HCl (BESIVANCE) 0.6 % SUSP Place 1 drop into the left eye 3 (three) times daily. She is using for 7 days for conjunctivitis.. She started on 08/23/11 and has not completed.      . cetirizine (ZYRTEC) 10 MG tablet Take 10 mg by mouth daily.      . cyclobenzaprine  (FLEXERIL) 10 MG tablet Take 5 mg by mouth at bedtime as needed.       . naproxen sodium (ANAPROX) 220 MG tablet Take 220 mg by mouth 2 (two) times daily with a meal.       No current facility-administered medications on file prior to visit.     REVIEW OF SYSTEMS: Please see history of present illness, otherwise all systems negative She does complain of right leg pain PHYSICAL EXAMINATION:   Vital signs are BP 128/68  Pulse 91  Resp 16  Ht 5' 3.5" (1.613 m)  Wt 150 lb (68.04 kg)  BMI 26.15 kg/m2  SpO2 95% General: The patient appears their stated age. HEENT:  No gross abnormalities Pulmonary:  Non labored breathing Musculoskeletal: There are no major deformities. Neurologic: No focal weakness or paresthesias are detected, Skin: There are no ulcer or rashes noted. Psychiatric: The patient has normal affect. Cardiovascular: There is a regular rate and rhythm without significant murmur appreciated. Palpable pedal pulses. No carotid bruit   Diagnostic Studies I have reviewed her MRA of the abdomen and pelvis. There has been no significant change and the aneurysmal portions. 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 I discussed with the patient that this may be GERD of her pain, however I would like for her to rule out all other causes before proceeding with further evaluation and possible treatment of her mesenteric stenosis. I told her that I would like her to undergo endoscopy, should she fail conservative treatment prior to being evaluated for mesenteric revascularization. I told her that I do not feel that her symptoms are classic for mesenteric ischemia, however this could be her underlying problem. She is going to contact me if she does not improve over the next several  months. #2: Her visceral aneurysms appeared to be stable. I will keep her regular appointment in September of 2015 with a MRI of the abdomen and pelvis.  Eldridge Abrahams, M.D. [facility] Office: [phone] Pager:  [phone]

## 2012-09-19 ENCOUNTER — Ambulatory Visit (INDEPENDENT_AMBULATORY_CARE_PROVIDER_SITE_OTHER): Payer: Medicare PPO | Admitting: Internal Medicine

## 2012-09-19 ENCOUNTER — Encounter: Payer: Self-pay | Admitting: Internal Medicine

## 2012-09-19 VITALS — BP 128/86 | HR 76 | Temp 98.5°F | Resp 18 | Wt 149.5 lb

## 2012-09-19 DIAGNOSIS — M069 Rheumatoid arthritis, unspecified: Secondary | ICD-10-CM

## 2012-09-19 DIAGNOSIS — R109 Unspecified abdominal pain: Secondary | ICD-10-CM

## 2012-09-19 DIAGNOSIS — M2550 Pain in unspecified joint: Secondary | ICD-10-CM

## 2012-09-19 MED ORDER — HYDROCODONE-ACETAMINOPHEN 5-325 MG PO TABS: 1.0000 | ORAL_TABLET | Freq: Four times a day (QID) | ORAL | Status: DC | PRN

## 2012-09-19 NOTE — Progress Notes (Signed)
Subjective:    Patient ID: [name], female    DOB: Dec 09, 1940, 72 y.o.   MRN: [id number]  Arthritis Presents for follow-up visit. She complains of pain. She reports no joint swelling or joint warmth. Affected locations include the right hip, left hip, left knee and right knee. Her pain is at a severity of 3/10. Associated symptoms include pain at night and pain while resting. Pertinent negatives include no diarrhea, dry eyes, dry mouth, dysuria, fatigue, fever, rash, Raynaud's syndrome, uveitis or weight loss. Compliance with total regimen is 26-50%.      Review of Systems  Constitutional: Negative.  Negative for fever, weight loss and fatigue.  HENT: Negative.   Eyes: Negative.   Respiratory: Negative.   Cardiovascular: Negative.  Negative for chest pain, palpitations and leg swelling.  Gastrointestinal: Negative.  Negative for nausea, vomiting, abdominal pain, diarrhea, constipation, blood in stool, abdominal distention, anal bleeding and rectal pain.  Endocrine: Negative.   Genitourinary: Negative.  Negative for dysuria, urgency, hematuria, flank pain, decreased urine volume and difficulty urinating.  Musculoskeletal: Positive for arthritis. Negative for joint swelling.  Skin: Negative.  Negative for rash.  Allergic/Immunologic: Negative.   Neurological: Negative.  Negative for dizziness.  Hematological: Negative.  Negative for adenopathy. Does not bruise/bleed easily.  Psychiatric/Behavioral: Negative.        Objective:   Physical Exam  Vitals reviewed. Constitutional: She is oriented to person, place, and time. She appears well-developed and well-nourished. No distress.  HENT:  Head: Normocephalic and atraumatic.  Mouth/Throat: Oropharynx is clear and moist. No oropharyngeal exudate.  Eyes: Conjunctivae are normal. Right eye exhibits no discharge. Left eye exhibits no discharge. No scleral icterus.  Neck: Normal range of motion. Neck supple. No JVD present. No tracheal  deviation present. No thyromegaly present.  Cardiovascular: Normal rate, regular rhythm and intact distal pulses.  Exam reveals no gallop and no friction rub.   No murmur heard. Pulmonary/Chest: Effort normal and breath sounds normal. No stridor. No respiratory distress. She has no wheezes. She has no rales. She exhibits no tenderness.  Abdominal: Soft. Bowel sounds are normal. She exhibits no distension and no mass. There is no tenderness. There is no rebound and no guarding.  Musculoskeletal: Normal range of motion. She exhibits no edema and no tenderness.  Lymphadenopathy:    She has no cervical adenopathy.  Neurological: She is oriented to person, place, and time.  Skin: Skin is warm and dry. No rash noted. She is not diaphoretic. No erythema. No pallor.  Psychiatric: She has a normal mood and affect. Her behavior is normal. Judgment and thought content normal.      Lab Results  Component Value Date   WBC 12.5* 09/04/2012   HGB 13.8 09/04/2012   HCT 41.6 09/04/2012   PLT 309.0 09/04/2012   GLUCOSE 101* 09/04/2012   CHOL 171 06/21/2010   TRIG 118.0 06/21/2010   HDL 71.90 06/21/2010   LDLDIRECT 162.1 10/28/2009   LDLCALC 76 06/21/2010   ALT 14 09/04/2012   AST 20 09/04/2012   NA 139 09/04/2012   K 4.2 09/04/2012   CL 102 09/04/2012   CREATININE 1.8* 09/04/2012   BUN 30* 09/04/2012   CO2 28 09/04/2012   TSH 1.11 09/04/2012   INR 1.0 11/29/2006     Mr Mra Abdomen Wo Contrast  09/12/2012   *RADIOLOGY REPORT*  Clinical Data:  Follow up visceral artery aneurysms and stenosis.  MRA ABDOMEN WITHOUT CONTRAST  Technique:  Multiplanar, multiecho pulse sequences of the  abdomen was obtained without intravenous contrast.  Angiographic images of abdomen and pelvis were obtained using MRA technique without intravenous contrast.  Comparison:  CTA 09/23/2013and MRA 12/28/2009  MRA ABDOMEN  Findings:  The spleen has been removed.  There are bilateral renal cortical cysts.  No gross abnormality to the liver and gallbladder. No  gross abnormality to the pancreas or adrenal gland.  Celiac trunk:  The origin of the celiac trunk is patent.  Again seen is a critical stenosis or focal occlusion of the proximal celiac trunk.  Beyond the critical lesion, there are two adjacent aneurysm formations that have not significantly changed.  The proximal aneurysmal segment measures 9 mm and the distal structure roughly measures 8 mm.  The branches beyond the small aneurysms are not visualized.  Superior mesenteric artery:  Stable critical stenosis at the origin of the superior mesenteric artery.  Right renal artery:  The main right renal artery is patent.  Small accessory right renal artery is poorly visualized.  Left renal artery:  Patent  Inferior mesenteric artery:  Not well visualized.  IMPRESSION: Stable disease involving the visceral arteries. 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  Stable critical stenosis at the origin of the superior mesenteric artery.  Limited evaluation of the inferior mesenteric artery.  Bilateral renal cysts.   Original Report Authenticated By: Markus Daft, M.D.  Assessment & Plan:

## 2012-09-19 NOTE — Patient Instructions (Signed)

## 2012-09-23 ENCOUNTER — Encounter: Payer: Self-pay | Admitting: Internal Medicine

## 2012-09-23 NOTE — Assessment & Plan Note (Signed)
She will add norco for additional relief from her pain

## 2012-09-23 NOTE — Assessment & Plan Note (Signed)
The pain has resolved The MRI was unremarkable Will follow for now

## 2012-09-23 NOTE — Assessment & Plan Note (Signed)
She will try norco for the pain 

## 2012-11-04 ENCOUNTER — Other Ambulatory Visit (HOSPITAL_COMMUNITY): Payer: Self-pay | Admitting: Obstetrics

## 2012-11-04 DIAGNOSIS — Z1231 Encounter for screening mammogram for malignant neoplasm of breast: Secondary | ICD-10-CM

## 2012-11-21 ENCOUNTER — Ambulatory Visit (INDEPENDENT_AMBULATORY_CARE_PROVIDER_SITE_OTHER): Payer: Medicare PPO | Admitting: Internal Medicine

## 2012-11-21 ENCOUNTER — Encounter: Payer: Self-pay | Admitting: Internal Medicine

## 2012-11-21 ENCOUNTER — Ambulatory Visit: Payer: Medicare PPO | Admitting: Internal Medicine

## 2012-11-21 VITALS — BP 90/78 | HR 72 | Temp 97.6°F | Resp 16 | Wt 149.0 lb

## 2012-11-21 DIAGNOSIS — I729 Aneurysm of unspecified site: Secondary | ICD-10-CM

## 2012-11-21 DIAGNOSIS — I2510 Atherosclerotic heart disease of native coronary artery without angina pectoris: Secondary | ICD-10-CM

## 2012-11-21 DIAGNOSIS — Z0000 Encounter for general adult medical examination without abnormal findings: Secondary | ICD-10-CM | POA: Insufficient documentation

## 2012-11-21 DIAGNOSIS — I10 Essential (primary) hypertension: Secondary | ICD-10-CM

## 2012-11-21 DIAGNOSIS — D510 Vitamin B12 deficiency anemia due to intrinsic factor deficiency: Secondary | ICD-10-CM

## 2012-11-21 DIAGNOSIS — M2560 Stiffness of unspecified joint, not elsewhere classified: Secondary | ICD-10-CM

## 2012-11-21 NOTE — Assessment & Plan Note (Signed)
Continue with current prescription therapy as reflected on the Med list.  

## 2012-11-21 NOTE — Assessment & Plan Note (Signed)
No abd pain lately

## 2012-11-21 NOTE — Assessment & Plan Note (Signed)
RA related

## 2012-11-21 NOTE — Assessment & Plan Note (Signed)
Here for medicare wellness/physical  Diet: heart healthy  Physical activity: not sedentary  Depression/mood screen: negative  Hearing: intact to whispered voice  Visual acuity: grossly normal, performs annual eye exam  ADLs: capable  Fall risk: none  Home safety: good  Cognitive evaluation: intact to orientation, naming, recall and repetition  EOL planning: adv directives, full code/ I agree  I have personally reviewed and have noted  1. The patient's medical and social history  2. Their use of alcohol, tobacco or illicit drugs  3. Their current medications and supplements  4. The patient's functional ability including ADL's, fall risks, home safety risks and hearing or visual impairment.  5. Diet and physical activities  6. Evidence for depression or mood disorders    Today patient counseled on age appropriate routine health concerns for screening and prevention, each reviewed and up to date or declined. Immunizations reviewed and up to date or declined. Labs ordered and reviewed. Risk factors for depression reviewed and negative. Hearing function and visual acuity are intact. ADLs screened and addressed as needed. Functional ability and level of safety reviewed and appropriate. Education, counseling and referrals performed based on assessed risks today. Patient provided with a copy of personalized plan for preventive services.         

## 2012-11-21 NOTE — Progress Notes (Signed)
   Subjective:    HPI  The patient is here for a wellness exam. The patient has been doing well overall without major physical or psychological issues going on lately.  The patient presents for a follow-up of  chronic hypertension, chronic dyslipidemia, CAD, RA controlled with medicines F/u R foot/bunion pain -well after surgery   BP Readings from Last 3 Encounters:  11/21/12 90/78  09/19/12 128/86  09/16/12 128/68   Wt Readings from Last 3 Encounters:  11/21/12 149 lb (67.586 kg)  09/19/12 149 lb 8 oz (67.813 kg)  09/16/12 150 lb (68.04 kg)     Review of Systems  Constitutional: Positive for fatigue. Negative for chills, activity change, appetite change and unexpected weight change.  HENT: Negative for congestion, mouth sores and sinus pressure.   Eyes: Negative for visual disturbance.  Respiratory: Negative for cough and chest tightness.   Cardiovascular: Negative for chest pain.  Gastrointestinal: Negative for nausea and abdominal pain.  Genitourinary: Negative for frequency, difficulty urinating and vaginal pain.  Musculoskeletal: Positive for back pain and arthralgias. Negative for joint swelling and gait problem.  Skin: Negative for pallor and rash.  Neurological: Negative for dizziness, tremors, weakness, numbness and headaches.  Psychiatric/Behavioral: Negative for confusion and sleep disturbance.       Objective:   Physical Exam  Constitutional: She appears well-developed and well-nourished. No distress.  HENT:  Head: Normocephalic.  Right Ear: External ear normal.  Left Ear: External ear normal.  Nose: Nose normal.  Mouth/Throat: Oropharynx is clear and moist.  Eyes: Conjunctivae are normal. Pupils are equal, round, and reactive to light. Right eye exhibits no discharge. Left eye exhibits no discharge.  Neck: Normal range of motion. Neck supple. No JVD present. No tracheal deviation present. No thyromegaly present.  Cardiovascular: Normal rate, regular  rhythm and normal heart sounds.   Pulmonary/Chest: No stridor. No respiratory distress. She has no wheezes.  Abdominal: Soft. Bowel sounds are normal. She exhibits no distension and no mass. There is no tenderness. There is no rebound and no guarding.  Musculoskeletal: She exhibits no edema and no tenderness.  Lymphadenopathy:    She has no cervical adenopathy.  Neurological: She displays normal reflexes. No cranial nerve deficit. She exhibits normal muscle tone. Coordination normal.  Skin: No rash noted. No erythema.  Psychiatric: She has a normal mood and affect. Her behavior is normal. Judgment and thought content normal.        Lab Results  Component Value Date   WBC 12.5* 09/04/2012   HGB 13.8 09/04/2012   HCT 41.6 09/04/2012   PLT 309.0 09/04/2012   GLUCOSE 101* 09/04/2012   CHOL 171 06/21/2010   TRIG 118.0 06/21/2010   HDL 71.90 06/21/2010   LDLDIRECT 162.1 10/28/2009   LDLCALC 76 06/21/2010   ALT 14 09/04/2012   AST 20 09/04/2012   NA 139 09/04/2012   K 4.2 09/04/2012   CL 102 09/04/2012   CREATININE 1.8* 09/04/2012   BUN 30* 09/04/2012   CO2 28 09/04/2012   TSH 1.11 09/04/2012   INR 1.0 11/29/2006      Assessment & Plan:

## 2012-11-28 ENCOUNTER — Ambulatory Visit (HOSPITAL_COMMUNITY)
Admission: RE | Admit: 2012-11-28 | Discharge: 2012-11-28 | Disposition: A | Discharge status: Home / Self Care | Payer: Medicare PPO | Source: Ambulatory Visit | Attending: Obstetrics | Admitting: Obstetrics

## 2012-11-28 DIAGNOSIS — Z1231 Encounter for screening mammogram for malignant neoplasm of breast: Secondary | ICD-10-CM | POA: Insufficient documentation

## 2012-12-02 ENCOUNTER — Encounter: Payer: Self-pay | Admitting: Cardiology

## 2012-12-03 ENCOUNTER — Encounter: Payer: Self-pay | Admitting: Cardiology

## 2012-12-03 ENCOUNTER — Ambulatory Visit (INDEPENDENT_AMBULATORY_CARE_PROVIDER_SITE_OTHER): Payer: Medicare PPO | Admitting: Cardiology

## 2012-12-03 VITALS — BP 120/60 | Ht 63.5 in | Wt 148.1 lb

## 2012-12-03 DIAGNOSIS — I2510 Atherosclerotic heart disease of native coronary artery without angina pectoris: Secondary | ICD-10-CM

## 2012-12-03 DIAGNOSIS — Z7901 Long term (current) use of anticoagulants: Secondary | ICD-10-CM

## 2012-12-03 DIAGNOSIS — Z9089 Acquired absence of other organs: Secondary | ICD-10-CM

## 2012-12-03 DIAGNOSIS — I25810 Atherosclerosis of coronary artery bypass graft(s) without angina pectoris: Secondary | ICD-10-CM

## 2012-12-03 DIAGNOSIS — Q8909 Congenital malformations of spleen: Secondary | ICD-10-CM

## 2012-12-03 DIAGNOSIS — Z951 Presence of aortocoronary bypass graft: Secondary | ICD-10-CM

## 2012-12-03 DIAGNOSIS — Z9081 Acquired absence of spleen: Secondary | ICD-10-CM

## 2012-12-03 DIAGNOSIS — E785 Hyperlipidemia, unspecified: Secondary | ICD-10-CM

## 2012-12-03 DIAGNOSIS — I10 Essential (primary) hypertension: Secondary | ICD-10-CM

## 2012-12-03 DIAGNOSIS — I714 Abdominal aortic aneurysm, without rupture: Secondary | ICD-10-CM

## 2012-12-03 NOTE — Patient Instructions (Addendum)
You are doing very well.  No major issues.    Keep having your cholesterol followed by your Primary Provider.  I think you are stable for 1 yr follow up.  Leonie Man, MD

## 2012-12-03 NOTE — Progress Notes (Signed)
Patient ID: [name], female   DOB: [birth year], 72 y.o.   MRN: [id number] PCP: [name], MD  Clinic Note: Chief Complaint  Patient presents with  . 18 month visit    no chest apin, no edema, littlesob   HPI: [name] is a 72 y.o. female with a PMH below who presents today for ~18 month follow-up. She is a former patient of Dr. 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  Subsequent evaluation showed the LIMA to be atretic her last catheterization 2005 demonstrated that the ostial LAD lesion was actually down about 30% from the original 90%. Her last nuclear stress test was in March 2004 which demonstrated a possible apical infarct, with no ischemia.  EF is 71%.  Her last visit was in  Liver 2013 which losartan dose was decreased from 50 to25 mg daily.is low but hypotensive, her  Interval History:  since her last visit, she denies any significant symptoms.  It is very active with Silver sneakers at least 3 days a week.  When she is taking her prednisone for recurrent breast carcinoma but more than that.  She denies any significant chest tightness or pressure with exertion during her exercise.  She does feel short of breath he rapidly walks upstairs or really pushes it with the exercise.   She still has arthritis pains mostly in her hips, knees and ankles.  Occasionally her hands and wrists are involved as well.  She generally doesn't like to stand still, but walking is better for her She does agree significant spider veins and varicose to the right leg appeared to slightly bothers her the most, but is mostly an aching sensation it is better with taking Ultram or Tylenol 3 times a day.  She denies any restless leg symptoms, heaviness or burning sensation.  She is not really interested in any further evaluation of this at  this time.  The remainder of Cardiovascular ROS is as follows: no chest pain or dyspnea on exertion negative for - edema, irregular heartbeat, loss of consciousness, murmur, orthopnea, palpitations, paroxysmal nocturnal dyspnea, rapid heart rate or shortness of breath  Additional cardiac review of systems: Lightheadedness - no, dizzinesscno, syncope/near-syncope - no; TIA/amaurosis fugax - no Melena - no, hematochezia no; hematuria - no; nosebleeds - no; claudication - no  Past Medical History  Diagnosis Date  . Hypothyroidism (acquired)     hypo  . Cervical disc disease   . Polymyalgia rheumatica     2011 Dr. Charlestine Night  . Anemia   . Hyperlipidemia   . S/P CABG x 1 1996    CABG--LIMA-LAD for ostial LAD (not felt to be PCI amenable). EF NORMAL then; LIMA now atretic  . Abdominal aortic aneurysm     REPAIRED IN 1993 BY [name]  AND HAS RECENTLY BEEN FOLLOWED BY [name]  . 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 Rheumatoid arthritis 2011    Dr.Truslow; fracture knees, hands and wrists -   . CAD in native artery 1996, 2002, 2005     Status post CABG x1 with LIMA-LAD for 90% stenosis in the ostial LAD reduced to 50% in 2002 and 30% in 2005.;  Atretic LIMA   Prior Cardiac Evaluation  and Past Surgical History: Past Surgical History  Procedure Laterality Date  . Splenectomy    . Cardiac catheterization    . Cervical spine surgery      plate 2008 Dr. Saintclair Halsted  . Carpal tunnel release    . Cataract extraction    . Hernia repair    . Bunionectomy  07/2011    right foot  . Coronary artery bypass graft  1996    INCLUDED AN INTERNAL MAMMARY ARTERY TO THE LAD. EF WAS NORMAL  . 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 Aspirin Other (See Comments)    Hurts stomach  . Codeine Nausea  And Vomiting  . Nitroglycerin Other (See Comments)    Heart rate drops    Current Outpatient Prescriptions  Medication Sig Dispense Refill  . acetaminophen (TYLENOL) 500 MG tablet Take 1,000 mg by mouth 3 (three) times daily as needed for pain.      [name] amoxicillin-clavulanate (AUGMENTIN) 500-125 MG per tablet Take 1 tablet by mouth 3 (three) times daily. prn      . atorvastatin (LIPITOR) 20 MG tablet Take 1 tablet (20 mg total) by mouth daily.  90 tablet  3  . Calcium Carbonate-Vitamin D (CALCIUM-VITAMIN D) 500-200 MG-UNIT per tablet Take 1 tablet by mouth daily.      Sarajane Marek Sodium 30-100 MG CAPS Take by mouth daily.      . clopidogrel (PLAVIX) 75 MG tablet Take 1 tablet (75 mg total) by mouth daily.  90 tablet  3  . leflunomide (ARAVA) 20 MG tablet Take 20 mg by mouth daily.      [name] levothyroxine (LEVOXYL) 88 MCG tablet Take 1 tablet (88 mcg total) by mouth daily.  90 tablet  3  . losartan (COZAAR) 25 MG tablet Take 1 tablet (25 mg total) by mouth daily.  90 tablet  3  . metoprolol succinate (TOPROL-XL) 25 MG 24 hr tablet Take 1 tablet (25 mg total) by mouth daily.  90 tablet  3  . Multiple Vitamins-Minerals (MULTIVITAL-M) TABS Take 0.5 tablets by mouth daily.      . traMADol (ULTRAM) 50 MG tablet Take 50 mg by mouth at bedtime.      . cetirizine (ZYRTEC) 10 MG tablet Take 10 mg by mouth daily.       No current facility-administered medications for this visit.    History   Social History  . Marital Status: Married    Spouse Name: N/A    Number of Children: 4  . Years of Education: N/A   Occupational History  . retired    Social History Main Topics  . Smoking status: Former Smoker    Quit date: 12/04/1959  . Smokeless tobacco: Never Used  . Alcohol Use: 1.5 oz/week    3 drink(s) per week  . Drug Use: No  . Sexual Activity: Yes   Other Topics Concern  . Not on file   Social History Narrative   Regular exercise- yes at the [facility]  [place] ; [place]. GOES TO [employer] 3 DAYS A WEEK FOR ABOUT 45 MIN. PER SESSION    ROS: A comprehensive Review of Systems - Negative except Arthritic pains as noted above.  Otherwise negative noted in history of present illness. mild leg cramping but nothing significant.  PHYSICAL EXAM BP 120/60  Ht 5' 3.5" (1.613 m)  Wt 148 lb 1.6 oz (67.178 kg)  BMI 25.82 kg/m2 General appearance: alert, cooperative, appears stated age, no distress and Healthy-appearing.  Answers questions appropriately Neck: no adenopathy, no carotid bruit, no JVD and supple, symmetrical, trachea midline Lungs: clear to auscultation bilaterally, normal percussion bilaterally and Nonlabored, and good air movement Heart: regular rate and rhythm, S1, S2 normal, no murmur, click, rub or gallop and prominent apical impulse Abdomen: soft, non-tender; bowel sounds normal; no masses,  no organomegaly and Well healed abdominal aneurysm scar.  No pulsatile mass.  No obvious bruit Extremities: extremities normal, atraumatic, no cyanosis or edema, varicose veins noted, venous stasis dermatitis noted and An extensive spider veins noted; more prominent on left than right Pulses: 2+ and symmetric Neurologic: Grossly normal HEENT: [place]/AT, EOMI, MMM, anicteric sclera  [id number] today: Yes Rate: 86 , Rhythm: Normal Sinus Rhythm, nonspecific ST-T changes  Recent Labs: None  ASSESSMENT / PLAN:  CAD in native artery Interestingly, with medical therapy, for LAD lesion has improved to the point where her her LIMA making atretic.  The only unfortunate factor here is that the LIMA is no longer an option for bypass. She does have some mild dyspnea, but nothing significant and is only really associated with walking up stairs or with brisk exercise.  Nothing to suggest angina or heart failure.  She is on Plavix, do to intolerance of aspirin as well as a beta blocker and ARB along with a statin.  Plan: Continue current  management.  HYPERTENSION Well-controlled today on ARB and beta blocker.  He had previously been too low, leading to reduction in her ARB dose of 25mg . No change to therapy.  Hyperlipidemia She seems to be tolerating Lipitor well without any significant myalgias.  Her labs are being followed by her primary physician.  Long term (current) use of anticoagulants No evidence of significant bleeding or bruising on Plavix.  Relatively intolerant of aspirin per the record.  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) She is having appropriate followup of her aneurysmal repair from cardiac and vascular surgery.  She tells me she is following up now with Dr. Trula Slade.    Orders Placed This Encounter  Procedures  . EKG 12-Lead   Followup: One year  Aparna Vanderweele W. Ellyn Hack, M.D., M.S. [facility] [address] [place], [place]  [id number]  [phone] Pager # [phone]

## 2012-12-06 ENCOUNTER — Encounter: Payer: Self-pay | Admitting: Internal Medicine

## 2012-12-21 ENCOUNTER — Encounter: Payer: Self-pay | Admitting: Cardiology

## 2012-12-21 DIAGNOSIS — E785 Hyperlipidemia, unspecified: Secondary | ICD-10-CM | POA: Insufficient documentation

## 2012-12-21 NOTE — Assessment & Plan Note (Signed)
She seems to be tolerating Lipitor well without any significant myalgias.  Her labs are being followed by her primary physician.

## 2012-12-21 NOTE — Assessment & Plan Note (Signed)
Interestingly, with medical therapy, for LAD lesion has improved to the point where her her LIMA making atretic.  The only unfortunate factor here is that the LIMA is no longer an option for bypass. She does have some mild dyspnea, but nothing significant and is only really associated with walking up stairs or with brisk exercise.  Nothing to suggest angina or heart failure.  She is on Plavix, do to intolerance of aspirin as well as a beta blocker and ARB along with a statin.  Plan: Continue current management.

## 2012-12-21 NOTE — Assessment & Plan Note (Signed)
No evidence of significant bleeding or bruising on Plavix.  Relatively intolerant of aspirin per the record.

## 2012-12-21 NOTE — Assessment & Plan Note (Addendum)
Well-controlled today on ARB and beta blocker.  He had previously been too low, leading to reduction in her ARB dose of 25mg . No change to therapy.

## 2012-12-21 NOTE — Assessment & Plan Note (Signed)
She is having appropriate followup of her aneurysmal repair from cardiac and vascular surgery.  She tells me she is following up now with Dr. Trula Slade.

## 2013-01-23 ENCOUNTER — Telehealth: Payer: Self-pay | Admitting: *Deleted

## 2013-01-23 ENCOUNTER — Ambulatory Visit (INDEPENDENT_AMBULATORY_CARE_PROVIDER_SITE_OTHER): Payer: Medicare PPO | Admitting: Internal Medicine

## 2013-01-23 ENCOUNTER — Encounter: Payer: Self-pay | Admitting: Internal Medicine

## 2013-01-23 VITALS — BP 140/92 | HR 92 | Temp 97.7°F | Wt 145.5 lb

## 2013-01-23 DIAGNOSIS — M62838 Other muscle spasm: Secondary | ICD-10-CM

## 2013-01-23 DIAGNOSIS — M62831 Muscle spasm of calf: Secondary | ICD-10-CM

## 2013-01-23 MED ORDER — CYCLOBENZAPRINE HCL 5 MG PO TABS: 5.0000 mg | ORAL_TABLET | Freq: Three times a day (TID) | ORAL | Status: DC | PRN

## 2013-01-23 NOTE — Telephone Encounter (Signed)
Patient called to report that she has increased LLE pain since yesterday; No injury or falls. 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 She saw the NP at her internist today and was Rx'd Flexeril, Ultram for diagnosis of muscle spasms in the leg. She was concerned about DVT even though she does not have any swelling, redness or warmth in her leg. I told her that at this time we would suggest that she follow the care plan from Webb Silversmith, NP and she agrees that this is appropriate. She will contact them if the flexeril does not help her LLE pain; they offered her a followup appt in their Saturday clinic if she was not any better or starts having any DVT symptoms.

## 2013-01-23 NOTE — Progress Notes (Signed)
Subjective:    Patient ID: [name], female    DOB: 18-May-1940, 72 y.o.   MRN: [id number]  HPI  Pt presents to the clinic today with c/o pain in her left calf. This started yesterday. She reports that it felt like a charlie horse. She took some tylenol during the day and a tramadol at night. Her leg continue to bother her throughout the nigh although the pain did get better. She has never had muscle spasms before. She is not able to take NSAID's because they irritate her stomach. She is concerned that she has a blood clot in her calf. She denies warmth, redness or swelling.  Review of Systems      Past Medical History  Diagnosis Date  . Hypothyroidism (acquired)     hypo  . Cervical disc disease   . Polymyalgia rheumatica     2011 Dr. Charlestine Night  . Anemia   . Hyperlipidemia   . S/P CABG x 1 1996    CABG--LIMA-LAD for ostial LAD (not felt to be PCI amenable). EF NORMAL then; LIMA now atretic  . Abdominal aortic aneurysm     REPAIRED IN 1993 BY [name]  AND HAS RECENTLY BEEN FOLLOWED BY [name]  . 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 Rheumatoid arthritis 2011    Dr.Truslow; fracture knees, hands and wrists -   . CAD in native artery 1996, 2002, 2005     Status post CABG x1 with LIMA-LAD for 90% stenosis in the ostial LAD reduced to 50% in 2002 and 30% in 2005.;  Atretic LIMA    Current Outpatient Prescriptions  Medication Sig Dispense Refill  . acetaminophen (TYLENOL) 500 MG tablet Take 1,000 mg by mouth 3 (three) times daily as needed for pain.      [name] amoxicillin-clavulanate (AUGMENTIN) 500-125 MG per tablet Take 1 tablet by mouth 3 (three) times daily. prn      . atorvastatin (LIPITOR) 20 MG tablet Take 1 tablet (20 mg total) by mouth daily.  90 tablet  3  . Calcium Carbonate-Vitamin D (CALCIUM-VITAMIN D) 500-200 MG-UNIT per tablet Take 1 tablet by mouth daily.      . clopidogrel (PLAVIX) 75 MG  tablet Take 1 tablet (75 mg total) by mouth daily.  90 tablet  3  . leflunomide (ARAVA) 20 MG tablet Take 20 mg by mouth daily.      [name] levothyroxine (LEVOXYL) 88 MCG tablet Take 1 tablet (88 mcg total) by mouth daily.  90 tablet  3  . losartan (COZAAR) 25 MG tablet Take 1 tablet (25 mg total) by mouth daily.  90 tablet  3  . metoprolol succinate (TOPROL-XL) 25 MG 24 hr tablet Take 1 tablet (25 mg total) by mouth daily.  90 tablet  3  . Multiple Vitamins-Minerals (MULTIVITAL-M) TABS Take 0.5 tablets by mouth daily.      . traMADol (ULTRAM) 50 MG tablet Take 50 mg by mouth at bedtime.      . cetirizine (ZYRTEC) 10 MG tablet Take 10 mg by mouth daily.      . cyclobenzaprine (FLEXERIL) 5 MG tablet Take 1 tablet (5 mg total) by mouth 3 (three) times daily as needed for muscle spasms.  30 tablet  1   No current facility-administered medications for this visit.    Allergies  Allergen Reactions  . Aspirin Other (See Comments)    Hurts stomach  .  Codeine Nausea And Vomiting  . Nitroglycerin Other (See Comments)    Heart rate drops    Family History  Problem Relation Age of Onset  . Hypertension    . Coronary artery disease    . Asthma Neg Hx   . Hypertension Mother   . Hypertension Father   . Colon cancer Neg Hx     History   Social History  . Marital Status: Married    Spouse Name: N/A    Number of Children: 4  . Years of Education: N/A   Occupational History  . retired    Social History Main Topics  . Smoking status: Former Smoker    Quit date: 12/04/1959  . Smokeless tobacco: Never Used  . Alcohol Use: 1.5 oz/week    3 drink(s) per week  . Drug Use: No  . Sexual Activity: Yes   Other Topics Concern  . Not on file   Social History Narrative   Regular exercise- yes at the [facility]  [place] ; [place]. GOES TO [employer] 3 DAYS A WEEK FOR ABOUT 45 MIN. PER SESSION     Constitutional: Denies fever, malaise, fatigue, headache or abrupt weight  changes.  Musculoskeletal: Pt reports pain in left calf. Denies decrease in range of motion, difficulty with gait, or joint pain and swelling.  Skin: Denies redness, rashes, lesions or ulcercations.  Neurological: Denies dizziness, difficulty with memory, difficulty with speech or problems with balance and coordination.   No other specific complaints in a complete review of systems (except as listed in HPI above).  Objective:   Physical Exam   BP 140/92  Pulse 92  Temp(Src) 97.7 F (36.5 C) (Oral)  Wt 145 lb 8 oz (65.998 kg)  BMI 25.37 kg/m2  SpO2 97% Wt Readings from Last 3 Encounters:  01/23/13 145 lb 8 oz (65.998 kg)  12/03/12 148 lb 1.6 oz (67.178 kg)  11/21/12 149 lb (67.586 kg)    General: Appears her stated age, well developed, well nourished in NAD. Skin: Warm, dry and intact. No rashes, lesions or ulcerations noted. Cardiovascular: Normal rate and rhythm. S1,S2 noted.  No murmur, rubs or gallops noted. No JVD or BLE edema. No carotid bruits noted. Pulmonary/Chest: Normal effort and positive vesicular breath sounds. No respiratory distress. No wheezes, rales or ronchi noted.  Musculoskeletal: Normal range of motion. No signs of joint swelling. No difficulty with gait. Very tense left gastrocnemius, tender to palpation. Neurological: Alert and oriented. Cranial nerves II-XII intact. Coordination normal. +DTRs bilaterally.   BMET    Component Value Date/Time   NA 139 09/04/2012 1644   K 4.2 09/04/2012 1644   CL 102 09/04/2012 1644   CO2 28 09/04/2012 1644   GLUCOSE 101* 09/04/2012 1644   BUN 30* 09/04/2012 1644   CREATININE 1.8* 09/04/2012 1644   CREATININE 1.40* 01/18/2012 1518   CALCIUM 9.7 09/04/2012 1644   GFRNONAA 40.32 03/15/2010 0941   GFRAA 57 09/24/2007 0816    Lipid Panel     Component Value Date/Time   CHOL 171 06/21/2010 1209   TRIG 118.0 06/21/2010 1209   HDL 71.90 06/21/2010 1209   CHOLHDL 2 06/21/2010 1209   VLDL 23.6 06/21/2010 1209   LDLCALC 76 06/21/2010 1209     CBC    Component Value Date/Time   WBC 12.5* 09/04/2012 1644   RBC 4.33 09/04/2012 1644   HGB 13.8 09/04/2012 1644   HCT 41.6 09/04/2012 1644   PLT 309.0 09/04/2012 1644  MCV 96.0 09/04/2012 1644   MCH 31.6 08/26/2011 1250   MCHC 33.1 09/04/2012 1644   RDW 14.6 09/04/2012 1644   LYMPHSABS 2.0 09/04/2012 1644   MONOABS 1.5* 09/04/2012 1644   EOSABS 0.2 09/04/2012 1644   BASOSABS 0.2* 09/04/2012 1644    Hgb A1C No results found for this basename: HGBA1C        Assessment & Plan:   Left calf pain secondary to muscle spasm, new onset:  eRx for Flexeril 5 mg TID prn Can take tramadol once in am in addition to at night Try soaking in a warm tub with epsom salt Low suspicion for clot, so no further imaging/bloodwork at this time  RTC as needed or if symptoms persist or worsen

## 2013-01-23 NOTE — Patient Instructions (Signed)
Muscle Cramps  Muscle cramps are due to sudden involuntary muscle contraction. This means you have no control over the tightening of a muscle (or muscles). Often there are no obvious causes. Muscle cramps may occur with overexertion. They may also occur with chilling of the muscles. An example of a muscle chilling activity is swimming. It is uncommon for cramps to be due to a serious underlying disorder. In most cases, muscle cramps improve (or leave) within minutes.  CAUSES   Some common causes are:   Injury.   Infections, especially viral.   Abnormal levels of the salts and ions in your blood (electrolytes). This could happen if you are taking water pills (diuretics).   Blood vessel disease where not enough blood is getting to the muscles (intermittent claudication).  Some uncommon causes are:   Side effects of some medicine (such as lithium).   Alcohol abuse.   Diseases where there is soreness (inflammation) of the muscular system.  HOME CARE INSTRUCTIONS    It may be helpful to massage, stretch, and relax the affected muscle.   Taking a dose of over-the-counter diphenhydramine is helpful for night leg cramps.  SEEK MEDICAL CARE IF:   Cramps are frequent and not relieved with medicine.  MAKE SURE YOU:    Understand these instructions.   Will watch your condition.   Will get help right away if you are not doing well or get worse.  Document Released: 10/07/2001 Document Revised: 07/10/2011 Document Reviewed: 04/08/2008  [employer] Patient Information 2014 [employer].

## 2013-01-25 ENCOUNTER — Ambulatory Visit (INDEPENDENT_AMBULATORY_CARE_PROVIDER_SITE_OTHER): Payer: Medicare PPO | Admitting: Family Medicine

## 2013-01-25 ENCOUNTER — Encounter: Payer: Self-pay | Admitting: Family Medicine

## 2013-01-25 VITALS — BP 130/90 | HR 90 | Temp 96.9°F | Ht 63.5 in | Wt 145.0 lb

## 2013-01-25 DIAGNOSIS — M79605 Pain in left leg: Secondary | ICD-10-CM | POA: Insufficient documentation

## 2013-01-25 DIAGNOSIS — M79609 Pain in unspecified limb: Secondary | ICD-10-CM

## 2013-01-25 MED ORDER — PREDNISONE 20 MG PO TABS: ORAL_TABLET | ORAL | Status: DC

## 2013-01-25 NOTE — Patient Instructions (Addendum)
Gentle low back and leg stretching, Can use tramadol for pain. Start prednisone taper. Stop flexeril.  Follow up with PVP MD next week.

## 2013-01-25 NOTE — Progress Notes (Signed)
Subjective:    Patient ID: [name], female    DOB: [birth year], 72 y.o.   MRN: [id number]  HPI  72 year old female with history of CAD, S/p CABG, asplenia, HTN, low thyroid, mesenteric artery stenosis and rheumatoid arthritis, a patient of Dr. Alain Marion presents to Saturday clinic with new onset sharp pain in left calf. Started radiating to left foot and thigh.  She saw Cecille Po for this on Thursday. Felt it was muscle cramps...started her on muscle relaxant. Using tramadol, muscle relaxant three times a day.  Pain continues today as bad as earlier in the week. Pain (7/10) is constant, worse with bending over towards feet. No redness, no swelling. No weakness. No numbness, no tingling. She is using cane.  No change in pain with movement.  No low back pain.  No preceeding falls or injuries, goes to exercsie class.    Review of Systems  Constitutional: Negative for fever and fatigue.  HENT: Negative for ear pain.   Eyes: Negative for pain.  Respiratory: Negative for chest tightness and shortness of breath.   Cardiovascular: Negative for chest pain, palpitations and leg swelling.  Gastrointestinal: Negative for abdominal pain.  Genitourinary: Negative for dysuria.       Objective:   Physical Exam  Constitutional: Vital signs are normal. She appears well-developed and well-nourished. She is cooperative.  Non-toxic appearance. She does not appear ill. No distress.  HENT:  Head: Normocephalic.  Right Ear: Hearing, tympanic membrane, external ear and ear canal normal. Tympanic membrane is not erythematous, not retracted and not bulging.  Left Ear: Hearing, tympanic membrane, external ear and ear canal normal. Tympanic membrane is not erythematous, not retracted and not bulging.  Nose: No mucosal edema or rhinorrhea. Right sinus exhibits no maxillary sinus tenderness and no frontal sinus tenderness. Left sinus exhibits no maxillary sinus tenderness and no frontal sinus  tenderness.  Mouth/Throat: Uvula is midline, oropharynx is clear and moist and mucous membranes are normal.  Eyes: Conjunctivae, EOM and lids are normal. Pupils are equal, round, and reactive to light. Lids are everted and swept, no foreign bodies found.  Neck: Trachea normal and normal range of motion. Neck supple. Carotid bruit is not present. No mass and no thyromegaly present.  Cardiovascular: Normal rate, regular rhythm, S1 normal, S2 normal, normal heart sounds and intact distal pulses.  Exam reveals no gallop and no friction rub.   No murmur heard. Pulses:      Dorsalis pedis pulses are 1+ on the right side, and 1+ on the left side.       Posterior tibial pulses are 1+ on the right side, and 1+ on the left side.  No peripheral edema  Pulmonary/Chest: Effort normal and breath sounds normal. Not tachypneic. No respiratory distress. She has no decreased breath sounds. She has no wheezes. She has no rhonchi. She has no rales.  Abdominal: Soft. Normal appearance and bowel sounds are normal. There is no tenderness.  Musculoskeletal:       Left knee: Normal.       Left ankle: Normal.       Lumbar back: She exhibits decreased range of motion, tenderness and bony tenderness.  Positive SLR  left leg diffusely ttp over muscles no focal joint pain.   No redness or swelling in either leg    Neurological: She is alert.  Skin: Skin is warm, dry and intact. No rash noted.  Psychiatric: Her speech is normal and behavior is normal. Judgment and thought  content normal. Her mood appears not anxious. Cognition and memory are normal. She does not exhibit a depressed mood.          Assessment & Plan:

## 2013-01-25 NOTE — Assessment & Plan Note (Signed)
Most consistent with sciatica.  Start prednisone, can use tramadol for pain.  Start gentle low back stretches.  No sign of DVT or PVD on exam.

## 2013-02-06 ENCOUNTER — Ambulatory Visit (INDEPENDENT_AMBULATORY_CARE_PROVIDER_SITE_OTHER): Payer: Medicare PPO | Admitting: Radiology

## 2013-02-06 DIAGNOSIS — Z23 Encounter for immunization: Secondary | ICD-10-CM

## 2013-02-06 NOTE — Patient Instructions (Signed)
Influenza Vaccine (Flu Vaccine, Inactivated) 2013 2014 What You Need to Know WHY GET VACCINATED?  Influenza ("flu") is a contagious disease that spreads around the [place] every winter, usually between October and May.  Flu is caused by the influenza virus, and can be spread by coughing, sneezing, and close contact.  Anyone can get flu, but the risk of getting flu is highest among children. Symptoms come on suddenly and may last several days. They can include:  Fever or chills.  Sore throat.  Muscle aches.  Fatigue.  Cough.  Headache.  Runny or stuffy nose. Flu can make some people much sicker than others. These people include young children, people 65 and older, pregnant women, and people with certain health conditions such as heart, lung or kidney disease, or a weakened immune system. Flu vaccine is especially important for these people, and anyone in close contact with them. Flu can also lead to pneumonia, and make existing medical conditions worse. It can cause diarrhea and seizures in children. Each year thousands of people in the [place] die from flu, and many more are hospitalized. Flu vaccine is the best protection we have from flu and its complications. Flu vaccine also helps prevent spreading flu from person to person. INACTIVATED FLU VACCINE There are 2 types of influenza vaccine:  You are getting an inactivated flu vaccine, which does not contain any live influenza virus. It is given by injection with a needle, and often called the "flu shot."  A different live, attenuated (weakened) influenza vaccine is sprayed into the nostrils. This vaccine is described in a separate Vaccine Information Statement. Flu vaccine is recommended every year. Children 6 months through 8 years of age should get 2 doses the first year they get vaccinated. Flu viruses are always changing. Each year's flu vaccine is made to protect from viruses that are most likely to cause disease  that year. While flu vaccine cannot prevent all cases of flu, it is our best defense against the disease. Inactivated flu vaccine protects against 3 or 4 different influenza viruses. It takes about 2 weeks for protection to develop after the vaccination, and protection lasts several months to a year. Some illnesses that are not caused by influenza virus are often mistaken for flu. Flu vaccine will not prevent these illnesses. It can only prevent influenza. A "high-dose" flu vaccine is available for people 65 years of age and older. The person giving you the vaccine can tell you more about it. Some inactivated flu vaccine contains a very small amount of a mercury-based preservative called thimerosal. Studies have shown that thimerosal in vaccines is not harmful, but flu vaccines that do not contain a preservative are available. SOME PEOPLE SHOULD NOT GET THIS VACCINE Tell the person who gives you the vaccine:  If you have any severe (life-threatening) allergies. If you ever had a life-threatening allergic reaction after a dose of flu vaccine, or have a severe allergy to any part of this vaccine, you may be advised not to get a dose. Most, but not all, types of flu vaccine contain a small amount of egg.  If you ever had Guillain Barr Syndrome (a severe paralyzing illness, also called GBS). Some people with a history of GBS should not get this vaccine. This should be discussed with your doctor.  If you are not feeling well. They might suggest waiting until you feel better. But you should come back. RISKS OF A VACCINE REACTION With a vaccine, like any medicine, there   is a chance of side effects. These are usually mild and go away on their own. Serious side effects are also possible, but are very rare. Inactivated flu vaccine does not contain live flu virus, sogetting flu from this vaccine is not possible. Brief fainting spells and related symptoms (such as jerking movements) can happen after any medical  procedure, including vaccination. Sitting or lying down for about 15 minutes after a vaccination can help prevent fainting and injuries caused by falls. Tell your doctor if you feel dizzy or lightheaded, or have vision changes or ringing in the ears. Mild problems following inactivated flu vaccine:  Soreness, redness, or swelling where the shot was given.  Hoarseness; sore, red or itchy eyes; or cough.  Fever.  Aches.  Headache.  Itching.  Fatigue. If these problems occur, they usually begin soon after the shot and last 1 or 2 days. Moderate problems following inactivated flu vaccine:  Young children who get inactivated flu vaccine and pneumococcal vaccine (PCV13) at the same time may be at increased risk for seizures caused by fever. Ask your doctor for more information. Tell your doctor if a child who is getting flu vaccine has ever had a seizure. Severe problems following inactivated flu vaccine:  A severe allergic reaction could occur after any vaccine (estimated less than 1 in a million doses).  There is a small possibility that inactivated flu vaccine could be associated with Guillan Barr Syndrome (GBS), no more than 1 or 2 cases per million people vaccinated. This is much lower than the risk of severe complications from flu, which can be prevented by flu vaccine. The safety of vaccines is always being monitored. For more information, visit: www.cdc.gov/vaccinesafety/ WHAT IF THERE IS A SERIOUS REACTION? What should I look for?  Look for anything that concerns you, such as signs of a severe allergic reaction, very high fever, or behavior changes. Signs of a severe allergic reaction can include hives, swelling of the face and throat, difficulty breathing, a fast heartbeat, dizziness, and weakness. These would start a few minutes to a few hours after the vaccination. What should I do?  If you think it is a severe allergic reaction or other emergency that cannot wait, call [phone] 1 1  or get the person to the nearest hospital. Otherwise, call your doctor.  Afterward, the reaction should be reported to the Vaccine Adverse Event Reporting System (VAERS). Your doctor might file this report, or you can do it yourself through the VAERS website at www.vaers.hhs.gov, or by calling 1-800-822-7967. VAERS is only for reporting reactions. They do not give medical advice. THE NATIONAL VACCINE INJURY COMPENSATION PROGRAM The National Vaccine Injury Compensation Program (VICP) is a federal program that was created to compensate people who may have been injured by certain vaccines. Persons who believe they may have been injured by a vaccine can learn about the program and about filing a claim by calling 1-800-338-2382 or visiting the VICP website at www.hrsa.gov/vaccinecompensation HOW CAN I LEARN MORE?  Ask your doctor.  Call your local or state health department.  Contact the Centers for Disease Control and Prevention (CDC):  Call 1-800-232-4636 (1-800-CDC-INFO) or  Visit CDC's website at www.cdc.gov/flu CDC Inactivated Influenza Vaccine Interim VIS (11/24/11) Document Released: 02/09/2006 Document Revised: 01/10/2012 Document Reviewed: 11/24/2011 Exit[employer] Patient Information 2014 [employer].  

## 2013-03-06 ENCOUNTER — Other Ambulatory Visit: Payer: Self-pay

## 2013-03-12 ENCOUNTER — Encounter: Payer: Self-pay | Admitting: Surgery

## 2013-03-20 ENCOUNTER — Encounter: Payer: Self-pay | Admitting: Internal Medicine

## 2013-03-20 ENCOUNTER — Ambulatory Visit (INDEPENDENT_AMBULATORY_CARE_PROVIDER_SITE_OTHER): Payer: Medicare PPO | Admitting: Internal Medicine

## 2013-03-20 ENCOUNTER — Other Ambulatory Visit (INDEPENDENT_AMBULATORY_CARE_PROVIDER_SITE_OTHER): Payer: Medicare PPO

## 2013-03-20 VITALS — BP 136/84 | HR 94 | Temp 96.8°F | Wt 145.5 lb

## 2013-03-20 DIAGNOSIS — I10 Essential (primary) hypertension: Secondary | ICD-10-CM

## 2013-03-20 DIAGNOSIS — E039 Hypothyroidism, unspecified: Secondary | ICD-10-CM

## 2013-03-20 DIAGNOSIS — H6123 Impacted cerumen, bilateral: Secondary | ICD-10-CM

## 2013-03-20 DIAGNOSIS — Z23 Encounter for immunization: Secondary | ICD-10-CM

## 2013-03-20 DIAGNOSIS — E785 Hyperlipidemia, unspecified: Secondary | ICD-10-CM

## 2013-03-20 DIAGNOSIS — Q8909 Congenital malformations of spleen: Secondary | ICD-10-CM

## 2013-03-20 DIAGNOSIS — H6120 Impacted cerumen, unspecified ear: Secondary | ICD-10-CM | POA: Insufficient documentation

## 2013-03-20 DIAGNOSIS — I2510 Atherosclerotic heart disease of native coronary artery without angina pectoris: Secondary | ICD-10-CM

## 2013-03-20 LAB — BASIC METABOLIC PANEL
BUN: 30 mg/dL — ABNORMAL HIGH (ref 6–23)
CO2: 26 mEq/L (ref 19–32)
Chloride: 103 mEq/L (ref 96–112)
Creatinine, Ser: 1.3 mg/dL — ABNORMAL HIGH (ref 0.4–1.2)
Glucose, Bld: 96 mg/dL (ref 70–99)
Potassium: 5 mEq/L (ref 3.5–5.1)
Sodium: 138 mEq/L (ref 135–145)

## 2013-03-20 MED ORDER — LEVOTHYROXINE SODIUM 88 MCG PO TABS: 88.0000 ug | ORAL_TABLET | Freq: Every day | ORAL | Status: DC

## 2013-03-20 MED ORDER — METOPROLOL SUCCINATE ER 25 MG PO TB24: 25.0000 mg | ORAL_TABLET | Freq: Every day | ORAL | Status: DC

## 2013-03-20 MED ORDER — LOSARTAN POTASSIUM 25 MG PO TABS: 25.0000 mg | ORAL_TABLET | Freq: Every day | ORAL | Status: DC

## 2013-03-20 MED ORDER — ATORVASTATIN CALCIUM 20 MG PO TABS: 20.0000 mg | ORAL_TABLET | Freq: Every day | ORAL | Status: DC

## 2013-03-20 MED ORDER — CLOPIDOGREL BISULFATE 75 MG PO TABS: 75.0000 mg | ORAL_TABLET | Freq: Every day | ORAL | Status: DC

## 2013-03-20 MED ORDER — AMOXICILLIN-POT CLAVULANATE 500-125 MG PO TABS: 1.0000 | ORAL_TABLET | Freq: Three times a day (TID) | ORAL | Status: DC

## 2013-03-20 NOTE — Assessment & Plan Note (Signed)
NMR Continue with current prescription therapy as reflected on the Med list.

## 2013-03-20 NOTE — Assessment & Plan Note (Signed)
Continue with current prescription therapy as reflected on the Med list.  

## 2013-03-20 NOTE — Assessment & Plan Note (Signed)
Prevnar Augmentin prn

## 2013-03-20 NOTE — Progress Notes (Signed)
Subjective:    HPI    The patient presents for a follow-up of  chronic hypertension, chronic dyslipidemia, CAD, RA controlled with medicines F/u R foot/bunion pain -well after surgery   BP Readings from Last 3 Encounters:  03/20/13 136/84  01/25/13 130/90  01/23/13 140/92   Wt Readings from Last 3 Encounters:  03/20/13 145 lb 8 oz (65.998 kg)  01/25/13 145 lb (65.772 kg)  01/23/13 145 lb 8 oz (65.998 kg)     Review of Systems  Constitutional: Positive for fatigue. Negative for chills, activity change, appetite change and unexpected weight change.  HENT: Negative for congestion, mouth sores and sinus pressure.   Eyes: Negative for visual disturbance.  Respiratory: Negative for cough and chest tightness.   Cardiovascular: Negative for chest pain.  Gastrointestinal: Negative for nausea and abdominal pain.  Genitourinary: Negative for frequency, difficulty urinating and vaginal pain.  Musculoskeletal: Positive for arthralgias and back pain. Negative for gait problem and joint swelling.  Skin: Negative for pallor and rash.  Neurological: Negative for dizziness, tremors, weakness, numbness and headaches.  Psychiatric/Behavioral: Negative for confusion and sleep disturbance.       Objective:   Physical Exam  Constitutional: She appears well-developed and well-nourished. No distress.  HENT:  Head: Normocephalic.  Right Ear: External ear normal.  Left Ear: External ear normal.  Nose: Nose normal.  Mouth/Throat: Oropharynx is clear and moist.  Eyes: Conjunctivae are normal. Pupils are equal, round, and reactive to light. Right eye exhibits no discharge. Left eye exhibits no discharge.  Neck: Normal range of motion. Neck supple. No JVD present. No tracheal deviation present. No thyromegaly present.  Cardiovascular: Normal rate, regular rhythm and normal heart sounds.   Pulmonary/Chest: No stridor. No respiratory distress. She has no wheezes.  Abdominal: Soft. Bowel sounds  are normal. She exhibits no distension and no mass. There is no tenderness. There is no rebound and no guarding.  Musculoskeletal: She exhibits no edema and no tenderness.  Lymphadenopathy:    She has no cervical adenopathy.  Neurological: She displays normal reflexes. No cranial nerve deficit. She exhibits normal muscle tone. Coordination normal.  Skin: No rash noted. No erythema.  Psychiatric: She has a normal mood and affect. Her behavior is normal. Judgment and thought content normal.  B wax      Lab Results  Component Value Date   WBC 12.5* 09/04/2012   HGB 13.8 09/04/2012   HCT 41.6 09/04/2012   PLT 309.0 09/04/2012   GLUCOSE 101* 09/04/2012   CHOL 171 06/21/2010   TRIG 118.0 06/21/2010   HDL 71.90 06/21/2010   LDLDIRECT 162.1 10/28/2009   LDLCALC 76 06/21/2010   ALT 14 09/04/2012   AST 20 09/04/2012   NA 139 09/04/2012   K 4.2 09/04/2012   CL 102 09/04/2012   CREATININE 1.8* 09/04/2012   BUN 30* 09/04/2012   CO2 28 09/04/2012   TSH 1.11 09/04/2012   INR 1.0 11/29/2006     Procedure Note :     Procedure :  Ear irrigation   Indication:  Cerumen impaction B   Risks, including pain, dizziness, eardrum perforation, bleeding, infection and others as well as benefits were explained to the patient in detail. Verbal consent was obtained and the patient agreed to proceed.    We used "The Elephant Ear Irrigation Device" filled with lukewarm water for irrigation. A large amount wax was recovered. Procedure has also required manual wax removal with an ear loop.   Tolerated well. Complications: None.  Postprocedure instructions :  Call if problems.    Assessment & Plan:

## 2013-03-20 NOTE — Assessment & Plan Note (Signed)
See procedure 

## 2013-03-20 NOTE — Progress Notes (Signed)
Pre visit review using our clinic review tool, if applicable. No additional management support is needed unless otherwise documented below in the visit note. 

## 2013-03-21 LAB — NMR LIPOPROFILE WITHOUT LIPIDS
HDL Particle Number: 50.9 umol/L (ref >=30.5)
LDL Particle Number: 1303 nmol/L — ABNORMAL HIGH (ref <1000)
LDL Size: 20.6 nm (ref >20.5)
LP-IR Score: 39 (ref <=45)
Large VLDL-P: 5.6 nmol/L — ABNORMAL HIGH (ref <=2.7)
Small LDL Particle Number: 369 nmol/L (ref <=527)
VLDL Size: 49.4 nm — ABNORMAL HIGH (ref <=46.6)

## 2013-04-04 ENCOUNTER — Encounter: Payer: Self-pay | Admitting: Family

## 2013-04-07 ENCOUNTER — Encounter: Payer: Self-pay | Admitting: Family

## 2013-04-07 ENCOUNTER — Ambulatory Visit (INDEPENDENT_AMBULATORY_CARE_PROVIDER_SITE_OTHER): Payer: Medicare PPO | Admitting: Family

## 2013-04-07 VITALS — BP 155/94 | HR 86 | Resp 16 | Ht 63.5 in | Wt 147.0 lb

## 2013-04-07 DIAGNOSIS — I728 Aneurysm of other specified arteries: Secondary | ICD-10-CM

## 2013-04-07 NOTE — Progress Notes (Signed)
[facility] [place]  History of Present Illness  [name] is a 72 y.o. ([birth year]) female who presents with chief complaint: annual visit in order for her insurance to cover MRI that is due in Sept., 2015. She underwent a workup for abdominal pain. Dr. Trula Slade assumed care for her from Dr. Amedeo Plenty in 2011. 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 Leafy Kindle in 1996 for rupture. 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 She has been treated in the past with steroids for polyarteritis or fibromuscular disease. Currently she is not on steroids.   Since she saw Dr. Trula Slade last May, her abdominal pain resolved since she stopped taking Aleve last May, she did not need the endoscopy since her abdominal pain resolved. She has had no more abdominal pain since last May. She is trying to lose weight and has lost some. She denies any problems with appetite.  She is not a smoker and does not have DM. She denies ever having a stroke or TIA symptoms. She has polymyalgia rheumatica and has tired feeling in legs with walking, denies non-healing wounds.  Past Medical History  Diagnosis Date  . Hypothyroidism (acquired)     hypo  . Cervical disc disease   . Polymyalgia rheumatica     2011 Dr. Charlestine Night  . Anemia   . Hyperlipidemia   . S/P CABG x 1 1996    CABG--LIMA-LAD for ostial LAD (not felt to be PCI amenable). EF NORMAL then; LIMA now atretic  . Abdominal aortic aneurysm     REPAIRED IN 1993 BY [name]  AND HAS RECENTLY BEEN FOLLOWED BY [name]  . 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 Rheumatoid arthritis 2011    Dr.Truslow; fracture knees, hands and wrists -   . CAD in native  artery 1996, 2002, 2005     Status post CABG x1 with LIMA-LAD for 90% stenosis in the ostial LAD reduced to 50% in 2002 and 30% in 2005.;  Atretic LIMA  . COPD (chronic obstructive pulmonary disease)     Past Surgical History  Procedure Laterality Date  . Splenectomy    . Cardiac catheterization    . Cervical spine surgery      plate 2008 Dr. Saintclair Halsted  . Carpal tunnel release    . Cataract extraction    . Hernia repair    . Bunionectomy  07/2011    right foot  . Coronary artery bypass graft  1996    INCLUDED AN INTERNAL MAMMARY ARTERY TO THE LAD. EF WAS NORMAL  . 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 Eye surgery      History   Social History  . Marital Status: Married    Spouse Name: N/A    Number of Children: 4  . Years of Education: N/A   Occupational History  . retired    Social History Main Topics  . Smoking status: Former Smoker    Quit date: 12/04/1959  . Smokeless tobacco: Never Used  . Alcohol Use: 1.5 oz/week    3 drink(s) per week  . Drug Use: No  .  Sexual Activity: Yes   Other Topics Concern  . Not on file   Social History Narrative   Regular [facility]- [facility]  [place] ; [place]. GOES TO [employer] 3 DAYS A WEEK FOR ABOUT 45 MIN. PER SESSION    Family History  Problem Relation Age of Onset  . Hypertension    . Coronary artery disease    . Asthma Neg Hx   . Colon cancer Neg Hx   . Hypertension Mother   . Diabetes Mother   . Heart disease Mother   . Hyperlipidemia Mother   . Stroke Father      Current Outpatient Prescriptions on File Prior to Visit  Medication Sig Dispense Refill  . acetaminophen (TYLENOL) 500 MG tablet Take 1,000 mg by mouth 3 (three) times daily as needed for pain.      [name] atorvastatin (LIPITOR) 20 MG tablet Take 1 tablet (20 mg total) by mouth daily.  90 tablet  3  . Calcium  Carbonate-Vitamin D (CALCIUM-VITAMIN D) 500-200 MG-UNIT per tablet Take 1 tablet by mouth daily.      . clopidogrel (PLAVIX) 75 MG tablet Take 1 tablet (75 mg total) by mouth daily.  90 tablet  3  . leflunomide (ARAVA) 20 MG tablet Take 20 mg by mouth daily.      [name] levothyroxine (LEVOXYL) 88 MCG tablet Take 1 tablet (88 mcg total) by mouth daily.  90 tablet  3  . losartan (COZAAR) 25 MG tablet Take 1 tablet (25 mg total) by mouth daily.  90 tablet  3  . metoprolol succinate (TOPROL-XL) 25 MG 24 hr tablet Take 1 tablet (25 mg total) by mouth daily.  90 tablet  3  . Multiple Vitamins-Minerals (MULTIVITAL-M) TABS Take 0.5 tablets by mouth daily.      . traMADol (ULTRAM) 50 MG tablet Take 50 mg by mouth as needed.       [name] amoxicillin-clavulanate (AUGMENTIN) 500-125 MG per tablet Take 1 tablet (500 mg total) by mouth 3 (three) times daily. prn  30 tablet  2  . cetirizine (ZYRTEC) 10 MG tablet Take 10 mg by mouth daily.      . cyclobenzaprine (FLEXERIL) 5 MG tablet Take 1 tablet (5 mg total) by mouth 3 (three) times daily as needed for muscle spasms.  30 tablet  1   No current facility-administered medications on file prior to visit.    Allergies  Allergen Reactions  . Aspirin Other (See Comments)    Hurts stomach  . Codeine Nausea And Vomiting  . Nitroglycerin Other (See Comments)    Heart rate drops    Physical Examination  Filed Vitals:   04/07/13 0956  BP: 155/94  Pulse: 86  Resp: 16  Height: 5' 3.5" (1.613 m)  Weight: 147 lb (66.679 kg)  SpO2: 98%    Body mass index is 25.63 kg/(m^2).  General: A&O x 3, WD.  Head: [place]/AT.  Eyes: PERRLA.  Pulmonary: Sym exp, good air movt, CTAB, no rales, rhonchi, or wheezing.  Cardiac: RRR, Nl S1, S2, no Murmurs, rubs or gallops.  Vascular: Vessel Right Left  Radial Palpable Palpable  Carotid  without bruit  without bruit  Aorta  palpable N/A  Femoral Palpable Palpable  Popliteal Not palpable Not palpable  PT not Palpable not  Palpable  DP  Palpable 2+  Palpable 2+   Gastrointestinal: soft, NTND, -G/R, - HSM, - masses, - CVAT B.  Musculoskeletal: M/S 4/5 throughout, Extremities without ischemic  changes.  Neurologic: CN 2-12 intact, Pain and light touch intact in extremities, Motor exam as listed above.  Psychiatric: Judgment intact, Mood & affect appropriate for pt's clinical situation.  Dermatologic: See M/S exam for extremity exam, no rashes otherwise noted.  Medical Decision Making  LATONGA WERTHEIM is a 72 y.o. female who presents for follow up for history of multiple splenic aneurysms which required an emergent splenectomy by Dr. Leafy Kindle in 1996 for rupture. 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   Dr. Stephens Shire last note advised MRI of her visceral aneurysms which appeared to be stable at that time. Patient has her scheduled return with Dr. Trula Slade as Nov. 23, 2015. Will schedule MRI of abd/pelvis for second week of Nov., 2015.   I discussed in depth with the patient the nature of aneurysmal disease, and emphasized the importance of maximal medical management including strict control of blood pressure, blood glucose, and lipid levels, obtaining regular exercise, and continued cessation of smoking.    The patient is aware that without maximal medical management the underlying aneurysmal disease process will progress, limiting the benefit of any interventions.  Thank you for allowing [place] to participate in this patient's care.  Clemon Chambers, RN, MSN, FNP-C [facility] Office: [phone]  Clinic MD: Kellie Simmering  04/07/2013, 10:35 AM

## 2013-04-10 ENCOUNTER — Encounter: Payer: Self-pay | Admitting: Internal Medicine

## 2013-04-10 ENCOUNTER — Other Ambulatory Visit: Payer: Self-pay | Admitting: *Deleted

## 2013-04-10 MED ORDER — ATORVASTATIN CALCIUM 20 MG PO TABS: 40.0000 mg | ORAL_TABLET | Freq: Every day | ORAL | Status: DC

## 2013-04-10 MED ORDER — ATORVASTATIN CALCIUM 40 MG PO TABS: 40.0000 mg | ORAL_TABLET | Freq: Every day | ORAL | Status: DC

## 2013-04-14 NOTE — Addendum Note (Signed)
Addended by: Mena Goes on: 04/14/2013 03:45 PM   Modules accepted: Orders

## 2013-06-08 ENCOUNTER — Encounter: Payer: Self-pay | Admitting: Internal Medicine

## 2013-07-17 ENCOUNTER — Encounter: Payer: Self-pay | Admitting: Internal Medicine

## 2013-07-17 ENCOUNTER — Ambulatory Visit (INDEPENDENT_AMBULATORY_CARE_PROVIDER_SITE_OTHER): Payer: 59 | Admitting: Internal Medicine

## 2013-07-17 VITALS — BP 146/86 | HR 84 | Temp 97.4°F | Resp 16 | Wt 147.0 lb

## 2013-07-17 DIAGNOSIS — N189 Chronic kidney disease, unspecified: Secondary | ICD-10-CM

## 2013-07-17 DIAGNOSIS — I10 Essential (primary) hypertension: Secondary | ICD-10-CM

## 2013-07-17 DIAGNOSIS — M069 Rheumatoid arthritis, unspecified: Secondary | ICD-10-CM

## 2013-07-17 DIAGNOSIS — E039 Hypothyroidism, unspecified: Secondary | ICD-10-CM

## 2013-07-17 MED ORDER — LEVOTHYROXINE SODIUM 88 MCG PO TABS: 88.0000 ug | ORAL_TABLET | Freq: Every day | ORAL | Status: DC

## 2013-07-17 MED ORDER — ATORVASTATIN CALCIUM 40 MG PO TABS: 40.0000 mg | ORAL_TABLET | Freq: Every day | ORAL | Status: DC

## 2013-07-17 NOTE — Assessment & Plan Note (Signed)
Continue with current prescription therapy as reflected on the Med list.  

## 2013-07-17 NOTE — Progress Notes (Signed)
Pre visit review using our clinic review tool, if applicable. No additional management support is needed unless otherwise documented below in the visit note. 

## 2013-07-17 NOTE — Assessment & Plan Note (Signed)
Labs

## 2013-07-17 NOTE — Progress Notes (Signed)
   Subjective:    HPI    The patient presents for a follow-up of  chronic hypertension, chronic dyslipidemia, CAD, RA controlled with medicines F/u R foot/bunion pain -well after surgery   BP Readings from Last 3 Encounters:  07/17/13 146/86  04/07/13 155/94  03/20/13 136/84   Wt Readings from Last 3 Encounters:  07/17/13 147 lb (66.679 kg)  04/07/13 147 lb (66.679 kg)  03/20/13 145 lb 8 oz (65.998 kg)     Review of Systems  Constitutional: Positive for fatigue. Negative for chills, activity change, appetite change and unexpected weight change.  HENT: Negative for congestion, mouth sores and sinus pressure.   Eyes: Negative for visual disturbance.  Respiratory: Negative for cough and chest tightness.   Cardiovascular: Negative for chest pain.  Gastrointestinal: Negative for nausea and abdominal pain.  Genitourinary: Negative for frequency, difficulty urinating and vaginal pain.  Musculoskeletal: Positive for arthralgias and back pain. Negative for gait problem and joint swelling.  Skin: Negative for pallor and rash.  Neurological: Negative for dizziness, tremors, weakness, numbness and headaches.  Psychiatric/Behavioral: Negative for confusion and sleep disturbance.       Objective:   Physical Exam  Constitutional: She appears well-developed and well-nourished. No distress.  HENT:  Head: Normocephalic.  Right Ear: External ear normal.  Left Ear: External ear normal.  Nose: Nose normal.  Mouth/Throat: Oropharynx is clear and moist.  Eyes: Conjunctivae are normal. Pupils are equal, round, and reactive to light. Right eye exhibits no discharge. Left eye exhibits no discharge.  Neck: Normal range of motion. Neck supple. No JVD present. No tracheal deviation present. No thyromegaly present.  Cardiovascular: Normal rate, regular rhythm and normal heart sounds.   Pulmonary/Chest: No stridor. No respiratory distress. She has no wheezes.  Abdominal: Soft. Bowel sounds are  normal. She exhibits no distension and no mass. There is no tenderness. There is no rebound and no guarding.  Musculoskeletal: She exhibits no edema and no tenderness.  Lymphadenopathy:    She has no cervical adenopathy.  Neurological: She displays normal reflexes. No cranial nerve deficit. She exhibits normal muscle tone. Coordination normal.  Skin: No rash noted. No erythema.  Psychiatric: She has a normal mood and affect. Her behavior is normal. Judgment and thought content normal.        Lab Results  Component Value Date   WBC 12.5* 09/04/2012   HGB 13.8 09/04/2012   HCT 41.6 09/04/2012   PLT 309.0 09/04/2012   GLUCOSE 96 03/20/2013   CHOL 171 06/21/2010   TRIG 118.0 06/21/2010   HDL 71.90 06/21/2010   LDLDIRECT 162.1 10/28/2009   LDLCALC 76 06/21/2010   ALT 14 09/04/2012   AST 20 09/04/2012   NA 138 03/20/2013   K 5.0 03/20/2013   CL 103 03/20/2013   CREATININE 1.3* 03/20/2013   BUN 30* 03/20/2013   CO2 26 03/20/2013   TSH 0.73 03/20/2013   INR 1.0 11/29/2006        Assessment & Plan:

## 2013-07-18 ENCOUNTER — Other Ambulatory Visit: Payer: Self-pay

## 2013-07-18 ENCOUNTER — Telehealth: Payer: Self-pay | Admitting: Internal Medicine

## 2013-07-18 ENCOUNTER — Encounter: Payer: Self-pay | Admitting: Internal Medicine

## 2013-07-18 MED ORDER — METOPROLOL SUCCINATE ER 25 MG PO TB24: 25.0000 mg | ORAL_TABLET | Freq: Every day | ORAL | Status: DC

## 2013-07-18 MED ORDER — CLOPIDOGREL BISULFATE 75 MG PO TABS: 75.0000 mg | ORAL_TABLET | Freq: Every day | ORAL | Status: DC

## 2013-07-18 MED ORDER — LOSARTAN POTASSIUM 25 MG PO TABS: 25.0000 mg | ORAL_TABLET | Freq: Every day | ORAL | Status: DC

## 2013-07-18 NOTE — Telephone Encounter (Signed)
Relevant patient education assigned to patient using Emmi. ° °

## 2013-07-23 ENCOUNTER — Other Ambulatory Visit: Payer: Self-pay | Admitting: *Deleted

## 2013-07-23 MED ORDER — LEVOTHYROXINE SODIUM 88 MCG PO TABS: 88.0000 ug | ORAL_TABLET | Freq: Every day | ORAL | Status: DC

## 2013-07-23 MED ORDER — ATORVASTATIN CALCIUM 40 MG PO TABS: 40.0000 mg | ORAL_TABLET | Freq: Every day | ORAL | Status: DC

## 2013-10-17 ENCOUNTER — Telehealth: Payer: Self-pay | Admitting: Cardiology

## 2013-10-23 ENCOUNTER — Encounter: Payer: Self-pay | Admitting: Internal Medicine

## 2013-10-23 ENCOUNTER — Ambulatory Visit (INDEPENDENT_AMBULATORY_CARE_PROVIDER_SITE_OTHER): Payer: 59 | Admitting: Internal Medicine

## 2013-10-23 VITALS — BP 140/92 | HR 76 | Temp 98.0°F | Resp 16 | Wt 149.0 lb

## 2013-10-23 DIAGNOSIS — E039 Hypothyroidism, unspecified: Secondary | ICD-10-CM

## 2013-10-23 DIAGNOSIS — I2510 Atherosclerotic heart disease of native coronary artery without angina pectoris: Secondary | ICD-10-CM

## 2013-10-23 DIAGNOSIS — I10 Essential (primary) hypertension: Secondary | ICD-10-CM

## 2013-10-23 DIAGNOSIS — M069 Rheumatoid arthritis, unspecified: Secondary | ICD-10-CM

## 2013-10-23 DIAGNOSIS — D510 Vitamin B12 deficiency anemia due to intrinsic factor deficiency: Secondary | ICD-10-CM

## 2013-10-23 NOTE — Assessment & Plan Note (Signed)
Continue with current prescription therapy as reflected on the Med list.  

## 2013-10-23 NOTE — Progress Notes (Signed)
   Subjective:    HPI    The patient presents for a follow-up of  chronic hypertension, chronic dyslipidemia, CAD, RA controlled with medicines F/u R foot/bunion pain -well after surgery   BP Readings from Last 3 Encounters:  10/23/13 140/92  07/17/13 146/86  04/07/13 155/94   Wt Readings from Last 3 Encounters:  10/23/13 149 lb (67.586 kg)  07/17/13 147 lb (66.679 kg)  04/07/13 147 lb (66.679 kg)     Review of Systems  Constitutional: Positive for fatigue. Negative for chills, activity change, appetite change and unexpected weight change.  HENT: Negative for congestion, mouth sores and sinus pressure.   Eyes: Negative for visual disturbance.  Respiratory: Negative for cough and chest tightness.   Cardiovascular: Negative for chest pain.  Gastrointestinal: Negative for nausea and abdominal pain.  Genitourinary: Negative for frequency, difficulty urinating and vaginal pain.  Musculoskeletal: Positive for arthralgias and back pain. Negative for gait problem and joint swelling.  Skin: Negative for pallor and rash.  Neurological: Negative for dizziness, tremors, weakness, numbness and headaches.  Psychiatric/Behavioral: Negative for confusion and sleep disturbance.       Objective:   Physical Exam  Constitutional: She appears well-developed and well-nourished. No distress.  HENT:  Head: Normocephalic.  Right Ear: External ear normal.  Left Ear: External ear normal.  Nose: Nose normal.  Mouth/Throat: Oropharynx is clear and moist.  Eyes: Conjunctivae are normal. Pupils are equal, round, and reactive to light. Right eye exhibits no discharge. Left eye exhibits no discharge.  Neck: Normal range of motion. Neck supple. No JVD present. No tracheal deviation present. No thyromegaly present.  Cardiovascular: Normal rate, regular rhythm and normal heart sounds.   Pulmonary/Chest: No stridor. No respiratory distress. She has no wheezes.  Abdominal: Soft. Bowel sounds are  normal. She exhibits no distension and no mass. There is no tenderness. There is no rebound and no guarding.  Musculoskeletal: She exhibits no edema and no tenderness.  Lymphadenopathy:    She has no cervical adenopathy.  Neurological: She displays normal reflexes. No cranial nerve deficit. She exhibits normal muscle tone. Coordination normal.  Skin: No rash noted. No erythema.  Psychiatric: She has a normal mood and affect. Her behavior is normal. Judgment and thought content normal.        Lab Results  Component Value Date   WBC 12.5* 09/04/2012   HGB 13.8 09/04/2012   HCT 41.6 09/04/2012   PLT 309.0 09/04/2012   GLUCOSE 96 03/20/2013   CHOL 171 06/21/2010   TRIG 118.0 06/21/2010   HDL 71.90 06/21/2010   LDLDIRECT 162.1 10/28/2009   LDLCALC 76 06/21/2010   ALT 14 09/04/2012   AST 20 09/04/2012   NA 138 03/20/2013   K 5.0 03/20/2013   CL 103 03/20/2013   CREATININE 1.3* 03/20/2013   BUN 30* 03/20/2013   CO2 26 03/20/2013   TSH 0.73 03/20/2013   INR 1.0 11/29/2006        Assessment & Plan:

## 2013-10-23 NOTE — Progress Notes (Signed)
Pre visit review using our clinic review tool, if applicable. No additional management support is needed unless otherwise documented below in the visit note. 

## 2013-10-23 NOTE — Assessment & Plan Note (Signed)
Labs

## 2013-10-24 ENCOUNTER — Other Ambulatory Visit (INDEPENDENT_AMBULATORY_CARE_PROVIDER_SITE_OTHER): Payer: 59

## 2013-10-24 DIAGNOSIS — E039 Hypothyroidism, unspecified: Secondary | ICD-10-CM

## 2013-10-24 DIAGNOSIS — I10 Essential (primary) hypertension: Secondary | ICD-10-CM

## 2013-10-24 DIAGNOSIS — D510 Vitamin B12 deficiency anemia due to intrinsic factor deficiency: Secondary | ICD-10-CM

## 2013-10-24 DIAGNOSIS — I2510 Atherosclerotic heart disease of native coronary artery without angina pectoris: Secondary | ICD-10-CM

## 2013-10-24 DIAGNOSIS — M069 Rheumatoid arthritis, unspecified: Secondary | ICD-10-CM

## 2013-10-24 LAB — CBC WITH DIFFERENTIAL/PLATELET
BASOS ABS: 0.1 10*3/uL (ref 0.0–0.1)
Basophils Relative: 2 % (ref 0.0–3.0)
Eosinophils Absolute: 0.6 10*3/uL (ref 0.0–0.7)
Eosinophils Relative: 8 % — ABNORMAL HIGH (ref 0.0–5.0)
HCT: 40.3 % (ref 36.0–46.0)
Hemoglobin: 13 g/dL (ref 12.0–15.0)
LYMPHS PCT: 27.3 % (ref 12.0–46.0)
Lymphs Abs: 1.9 10*3/uL (ref 0.7–4.0)
MCHC: 32.3 g/dL (ref 30.0–36.0)
MCV: 96.9 fl (ref 78.0–100.0)
MONOS PCT: 12.7 % — AB (ref 3.0–12.0)
Monocytes Absolute: 0.9 10*3/uL (ref 0.1–1.0)
Neutro Abs: 3.6 10*3/uL (ref 1.4–7.7)
Neutrophils Relative %: 50 % (ref 43.0–77.0)
PLATELETS: 347 10*3/uL (ref 150.0–400.0)
RBC: 4.16 Mil/uL (ref 3.87–5.11)
RDW: 15 % (ref 11.5–15.5)
WBC: 7.1 10*3/uL (ref 4.0–10.5)

## 2013-10-24 LAB — BASIC METABOLIC PANEL
BUN: 25 mg/dL — AB (ref 6–23)
CALCIUM: 9.6 mg/dL (ref 8.4–10.5)
CO2: 29 mEq/L (ref 19–32)
Chloride: 105 mEq/L (ref 96–112)
Creatinine, Ser: 1.3 mg/dL — ABNORMAL HIGH (ref 0.4–1.2)
GFR: 41.66 mL/min — AB (ref >60.00)
Glucose, Bld: 111 mg/dL — ABNORMAL HIGH (ref 70–99)
Potassium: 4.6 mEq/L (ref 3.5–5.1)
SODIUM: 142 meq/L (ref 135–145)

## 2013-10-24 LAB — LIPID PANEL
CHOL/HDL RATIO: 2
Cholesterol: 183 mg/dL (ref 0–200)
HDL: 82.1 mg/dL (ref >39.00)
LDL CALC: 82 mg/dL (ref 0–99)
NonHDL: 100.9
TRIGLYCERIDES: 93 mg/dL (ref 0.0–149.0)
VLDL: 18.6 mg/dL (ref 0.0–40.0)

## 2013-10-24 LAB — HEPATIC FUNCTION PANEL
ALT: 28 U/L (ref 0–35)
AST: 30 U/L (ref 0–37)
Albumin: 4.1 g/dL (ref 3.5–5.2)
Alkaline Phosphatase: 87 U/L (ref 39–117)
BILIRUBIN DIRECT: 0.2 mg/dL (ref 0.0–0.3)
BILIRUBIN TOTAL: 0.9 mg/dL (ref 0.2–1.2)
Total Protein: 7 g/dL (ref 6.0–8.3)

## 2013-10-24 LAB — VITAMIN B12: VITAMIN B 12: 559 pg/mL (ref 211–911)

## 2013-10-24 LAB — CK: Total CK: 42 U/L (ref 7–177)

## 2013-10-24 LAB — TSH: TSH: 1.83 u[IU]/mL (ref 0.35–4.50)

## 2013-11-03 NOTE — Telephone Encounter (Signed)
Closed encounter °

## 2013-11-18 ENCOUNTER — Ambulatory Visit (INDEPENDENT_AMBULATORY_CARE_PROVIDER_SITE_OTHER): Payer: Medicare Other | Admitting: Family Medicine

## 2013-11-18 VITALS — BP 130/82 | HR 88 | Temp 98.0°F | Resp 16 | Ht 63.5 in | Wt 148.2 lb

## 2013-11-18 DIAGNOSIS — Z111 Encounter for screening for respiratory tuberculosis: Secondary | ICD-10-CM

## 2013-11-18 NOTE — Progress Notes (Signed)
   Subjective:    Patient ID: [name], female    DOB: May 20, 1940, 73 y.o.   MRN: [id number]  HPI    Review of Systems     Objective:   Physical Exam        Assessment & Plan:  Tuberculosis Risk Questionnaire  1. Were you born outside the [place] in one of the following parts of the world:    [place], [place], [place], [place] or [place]?  No  2. Have you traveled outside the [place] and lived for more than one month in one of the following parts of the world:  [place], [place], [place], [place] or [place]?  No  3. Do you have a compromised immune system such as from any of the following conditions:  HIV/AIDS, organ or bone marrow transplantation, diabetes, immunosuppressive   medicines (e.g. Prednisone, Remicaide), leukemia, lymphoma, cancer of the   head or neck, gastrectomy or jejunal bypass, end-stage renal disease (on   dialysis), or silicosis?  No    4. Have you ever done one of the following:    Used crack cocaine, injected illegal drugs, worked or resided in jail or prison,   worked or resided at a homeless shelter, or worked as a Dietitian in   direct contact with patients?  No  5. Have you ever been exposed to anyone with infectious tuberculosis?  No Tuberculosis Risk Questionnaire  1. No Were you born outside the [place] in one of the following parts of the world: [place], [place], [place], [place] or [place]?    2. No Have you traveled outside the [place] and lived for more than one month in one of the following parts of the world: [place], [place], [place], [place] or [place]?    3. No Do you have a compromised immune system such as from any of the following conditions:HIV/AIDS, organ or bone marrow transplantation, diabetes, immunosuppressive medicines (e.g. Prednisone, Remicaide), leukemia, lymphoma, cancer of the head or neck, gastrectomy or jejunal bypass, end-stage renal disease (on  dialysis), or silicosis?     4. Yes Independent Home Have you ever or do you plan on working in: a residential care center, a health care facility, a jail or prison or homeless shelter?    5. No Have you ever: injected illegal drugs, used crack cocaine, lived in a homeless shelter  or been in jail or prison?     6. No Have you ever been exposed to anyone with infectious tuberculosis?    Tuberculosis Symptom Questionnaire  Do you currently have any of the following symptoms?  1. No Unexplained cough lasting more than 3 weeks?   2. No Unexplained fever lasting more than 3 weeks.   3. No Night Sweats (sweating that leaves the bedclothes and sheets wet)     4. No Shortness of Breath   5. No Chest Pain   6. No Unintentional weight loss    7. No  Unexplained fatigue (very tired for no reason)

## 2013-11-18 NOTE — Progress Notes (Signed)
Subjective:    Patient ID: [name], female    DOB: 1940/08/21, 73 y.o.   MRN: [id number] This chart was scribed for Shawnee Knapp, MD by Cathie Hoops, ED Scribe. The patient was seen in Room 12. The patient's care was started at 12:04 PM.   Chief Complaint  Patient presents with  . PPD Reading    moving to an Independent home have to have test before they can move    HPI HPI Comments: [name] is a 73 y.o. female who presents to the [facility] requesting a PPD placement only. Pt states they are moving to [facility].  Past Medical History  Diagnosis Date  . Hypothyroidism (acquired)     hypo  . Cervical disc disease   . Polymyalgia rheumatica     2011 Dr. Charlestine Night  . Anemia   . Hyperlipidemia   . S/P CABG x 1 1996    CABG--LIMA-LAD for ostial LAD (not felt to be PCI amenable). EF NORMAL then; LIMA now atretic  . Abdominal aortic aneurysm     REPAIRED IN 1993 BY [name]  AND HAS RECENTLY BEEN FOLLOWED BY [name]  . 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 Rheumatoid arthritis 2011    Dr.Truslow; fracture knees, hands and wrists -   . CAD in native artery 1996, 2002, 2005     Status post CABG x1 with LIMA-LAD for 90% stenosis in the ostial LAD reduced to 50% in 2002 and 30% in 2005.;  Atretic LIMA  . COPD (chronic obstructive pulmonary disease)    Current Outpatient Prescriptions on File Prior to Visit  Medication Sig Dispense Refill  . acetaminophen (TYLENOL) 500 MG tablet Take 1,000 mg by mouth 3 (three) times daily as needed for pain.      [name] amoxicillin-clavulanate (AUGMENTIN) 500-125 MG per tablet Take 1 tablet (500 mg total) by mouth 3 (three) times daily. prn  30 tablet  2  . atorvastatin (LIPITOR) 40 MG tablet Take 1 tablet (40 mg total) by mouth daily.  90 tablet  3  . Calcium Carbonate-Vitamin D (CALCIUM-VITAMIN D) 500-200 MG-UNIT per tablet Take 1 tablet  by mouth daily.      . clopidogrel (PLAVIX) 75 MG tablet Take 1 tablet (75 mg total) by mouth daily.  90 tablet  3  . cyclobenzaprine (FLEXERIL) 5 MG tablet Take 1 tablet (5 mg total) by mouth 3 (three) times daily as needed for muscle spasms.  30 tablet  1  . leflunomide (ARAVA) 20 MG tablet Take 20 mg by mouth daily.      [name] levothyroxine (LEVOXYL) 88 MCG tablet Take 1 tablet (88 mcg total) by mouth daily.  90 tablet  3  . losartan (COZAAR) 25 MG tablet Take 1 tablet (25 mg total) by mouth daily.  90 tablet  3  . metoprolol succinate (TOPROL-XL) 25 MG 24 hr tablet Take 1 tablet (25 mg total) by mouth daily.  90 tablet  3  . Multiple Vitamins-Minerals (MULTIVITAL-M) TABS Take 0.5 tablets by mouth daily.      . predniSONE (DELTASONE) 20 MG tablet continuous as needed. 3 tabs by mouth daily x 3 days, then 2 tabs by mouth daily x 2 days then 1 tab by mouth daily x 2 days      . cetirizine (ZYRTEC) 10 MG tablet Take 10 mg by mouth  daily.       No current facility-administered medications on file prior to visit.   Allergies  Allergen Reactions  . Aspirin Other (See Comments)    Hurts stomach  . Codeine Nausea And Vomiting  . Nitroglycerin Other (See Comments)    Heart rate drops    Review of Systems  Constitutional: Negative for fever, chills, diaphoresis, fatigue and unexpected weight change.  Respiratory: Negative for cough, shortness of breath and wheezing.   Hematological: Negative for adenopathy.      Triage Vitals: BP 130/82  Pulse 88  Temp(Src) 98 F (36.7 C) (Oral)  Resp 16  Ht 5' 3.5" (1.613 m)  Wt 148 lb 3.2 oz (67.223 kg)  BMI 25.84 kg/m2  SpO2 100%  Objective:   Physical Exam  Constitutional: She is oriented to person, place, and time. She appears well-developed and well-nourished. No distress.  HENT:  Head: Normocephalic and atraumatic.  Right Ear: External ear normal.  Eyes: Conjunctivae are normal. No scleral icterus.  Pulmonary/Chest: Effort normal.    Neurological: She is alert and oriented to person, place, and time.  Skin: Skin is warm and dry. She is not diaphoretic. No erythema.  Psychiatric: She has a normal mood and affect. Her behavior is normal.       Assessment & Plan:  12:06 PM- Will see Thursday night or Friday morning to read injection site. Patient informed of current plan for treatment and evaluation and agrees with plan at this time. Screening-pulmonary TB - Plan: TB Skin Test  No orders of the defined types were placed in this encounter.    I personally performed the services described in this documentation, which was scribed in my presence. The recorded information has been reviewed and considered, and addended by me as needed.  Delman Cheadle, MD MPH

## 2013-11-20 ENCOUNTER — Ambulatory Visit (INDEPENDENT_AMBULATORY_CARE_PROVIDER_SITE_OTHER): Payer: Medicare Other | Admitting: *Deleted

## 2013-11-20 DIAGNOSIS — Z111 Encounter for screening for respiratory tuberculosis: Secondary | ICD-10-CM

## 2013-11-20 DIAGNOSIS — Z09 Encounter for follow-up examination after completed treatment for conditions other than malignant neoplasm: Secondary | ICD-10-CM

## 2013-11-20 LAB — TB SKIN TEST
Induration: 0 mm
TB Skin Test: NEGATIVE

## 2013-12-08 ENCOUNTER — Telehealth: Payer: Self-pay | Admitting: Cardiology

## 2013-12-08 NOTE — Telephone Encounter (Signed)
Closed encounter °

## 2013-12-09 ENCOUNTER — Ambulatory Visit (INDEPENDENT_AMBULATORY_CARE_PROVIDER_SITE_OTHER): Payer: 59 | Admitting: Nurse Practitioner

## 2013-12-09 ENCOUNTER — Encounter: Payer: Self-pay | Admitting: Nurse Practitioner

## 2013-12-09 VITALS — BP 98/62 | HR 98 | Temp 97.6°F | Ht 63.5 in | Wt 148.4 lb

## 2013-12-09 DIAGNOSIS — Z0000 Encounter for general adult medical examination without abnormal findings: Secondary | ICD-10-CM

## 2013-12-09 NOTE — Progress Notes (Signed)
Subjective:    [name] is a 73 y.o. female who presents for Medicare Annual/Subsequent preventive examination.  Preventive Screening-Counseling & Management  Tobacco History  Smoking status  . Former Smoker  . Quit date: 12/04/1958  Smokeless tobacco  . Never Used     Problems Prior to Visit 1.   Current Problems (verified) Patient Active Problem List   Diagnosis Date Noted  . Splenic artery aneurysm 04/07/2013  . Cerumen impaction 03/20/2013  . Left leg pain 01/25/2013  . CAD in native artery   . Hyperlipidemia   . Well adult exam 11/21/2012  . Pernicious anemia 09/04/2012  . 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 Bunion of right foot 07/13/2011  . Long term (current) use of anticoagulants 11/08/2010  . Rheumatoid arthritis(714.0) 06/23/2010  . ARTHRALGIA 11/26/2009  . TOBACCO USE, QUIT 05/18/2009  . BLEPHARITIS 03/23/2008  . 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 HYPOTHYROIDISM 12/08/2006  . MACULAR DEGENERATION 12/08/2006  . HYPERTENSION 12/08/2006  . HIATAL HERNIA 12/08/2006  . RENAL FAILURE, CHRONIC 12/08/2006  . ASPLENIA 12/08/2006  . 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) 12/22/1994    Class: History of  . 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; now atretic and significantly improved LAD lesion without intervention 12/22/1994    Class: History of    Medications Prior to Visit Current Outpatient Prescriptions on File Prior to Visit  Medication Sig Dispense Refill  . amoxicillin-clavulanate (AUGMENTIN) 500-125 MG per tablet Take 1 tablet (500 mg total) by mouth 3 (three) times daily. prn  30 tablet  2  . atorvastatin (LIPITOR) 40 MG tablet Take 1 tablet (40 mg total) by mouth daily.  90 tablet  3  . Calcium Carbonate-Vitamin D (CALCIUM-VITAMIN D) 500-200 MG-UNIT per tablet Take 1 tablet by mouth daily.      . cetirizine (ZYRTEC) 10 MG tablet Take 10 mg by mouth daily.      . clopidogrel (PLAVIX) 75 MG tablet Take 1 tablet  (75 mg total) by mouth daily.  90 tablet  3  . leflunomide (ARAVA) 20 MG tablet Take 20 mg by mouth daily.      [name] levothyroxine (LEVOXYL) 88 MCG tablet Take 1 tablet (88 mcg total) by mouth daily.  90 tablet  3  . losartan (COZAAR) 25 MG tablet Take 1 tablet (25 mg total) by mouth daily.  90 tablet  3  . metoprolol succinate (TOPROL-XL) 25 MG 24 hr tablet Take 1 tablet (25 mg total) by mouth daily.  90 tablet  3  . Multiple Vitamins-Minerals (MULTIVITAL-M) TABS Take 0.5 tablets by mouth daily.      . predniSONE (DELTASONE) 20 MG tablet continuous as needed. 3 tabs by mouth daily x 3 days, then 2 tabs by mouth daily x 2 days then 1 tab by mouth daily x 2 days       No current facility-administered medications on file prior to visit.    Current Medications (verified) Current Outpatient Prescriptions  Medication Sig Dispense Refill  . amoxicillin-clavulanate (AUGMENTIN) 500-125 MG per tablet Take 1 tablet (500 mg total) by mouth 3 (three) times daily. prn  30 tablet  2  . atorvastatin (LIPITOR) 40 MG tablet Take 1 tablet (40 mg total) by mouth daily.  90 tablet  3  . Calcium Carbonate-Vitamin D (CALCIUM-VITAMIN D) 500-200 MG-UNIT per tablet Take 1 tablet by mouth daily.      . cetirizine (ZYRTEC) 10 MG tablet Take 10 mg by mouth daily.      [name]  clopidogrel (PLAVIX) 75 MG tablet Take 1 tablet (75 mg total) by mouth daily.  90 tablet  3  . Docusate Calcium (STOOL SOFTENER PO) Take by mouth.      . leflunomide (ARAVA) 20 MG tablet Take 20 mg by mouth daily.      [name] levothyroxine (LEVOXYL) 88 MCG tablet Take 1 tablet (88 mcg total) by mouth daily.  90 tablet  3  . losartan (COZAAR) 25 MG tablet Take 1 tablet (25 mg total) by mouth daily.  90 tablet  3  . metoprolol succinate (TOPROL-XL) 25 MG 24 hr tablet Take 1 tablet (25 mg total) by mouth daily.  90 tablet  3  . Multiple Vitamins-Minerals (MULTIVITAL-M) TABS Take 0.5 tablets by mouth daily.      . predniSONE (DELTASONE) 20 MG tablet continuous as  needed. 3 tabs by mouth daily x 3 days, then 2 tabs by mouth daily x 2 days then 1 tab by mouth daily x 2 days      . psyllium (METAMUCIL) 58.6 % packet Take 1 packet by mouth daily.       No current facility-administered medications for this visit.     Allergies (verified) Aspirin; Codeine; and Nitroglycerin   PAST HISTORY  Family History Family History  Problem Relation Age of Onset  . Hypertension    . Coronary artery disease    . Asthma Neg Hx   . Colon cancer Neg Hx   . Hypertension Mother   . Diabetes Mother   . Heart disease Mother   . Hyperlipidemia Mother   . Stroke Father   . Hyperlipidemia Sister   . Hypertension Sister   . Hypertension Daughter   . Cancer Paternal Uncle     Deceased from cancer not sure of site  . Hypertension Son   . Hyperlipidemia Son   . Hyperlipidemia Son   . Hypertension Son   . Hyperlipidemia Son   . Hypertension Son     Social History History  Substance Use Topics  . Smoking status: Former Smoker    Quit date: 12/04/1958  . Smokeless tobacco: Never Used  . Alcohol Use: 0.5 - 1.5 oz/week    1-3 drink(s) per week     Are there smokers in your home (other than you)? No  Risk Factors Current exercise habits: Gym/ health club routine includes yoga.  Dietary issues discussed: eats a well balanced diet   Cardiac risk factors: advanced age (older than 57 for men, 39 for women), dyslipidemia, family history of premature cardiovascular disease and hypertension.  Depression Screen (Note: if answer to either of the following is "Yes", a more complete depression screening is indicated)   Over the past two weeks, have you felt down, depressed or hopeless? No  Over the past two weeks, have you felt little interest or pleasure in doing things? No  Have you lost interest or pleasure in daily life? No Do you often feel hopeless? No  Do you cry easily over simple problems? No  Activities of Daily Living In your present state of health,  do you have any difficulty performing the following activities?:  Driving? No Managing money?  No Feeding yourself? No Getting from bed to chair? NoBP 98/62  Pulse 98  Temp(Src) 97.6 F (36.4 C) (Oral)  Ht 5' 3.5" (1.613 m)  Wt 148 lb 6.4 oz (67.314 kg)  BMI 25.87 kg/m2  SpO2 96%   Climbing a flight of stairs? No Preparing food and eating?: No Bathing or  showering? No Getting dressed: No Getting to the toilet? No Using the toilet:No Moving around from place to place: No In the past year have you fallen or had a near fall?:No   Are you sexually active?  No  Do you have more than one partner?  No  Hearing Difficulties: No Do you often ask people to speak up or repeat themselves? No Do you experience ringing or noises in your ears? No Do you have difficulty understanding soft or whispered voices? No   Do you feel that you have a problem with memory? No  Do you often misplace items? No  Do you feel safe at home?  Yes  Cognitive Testing  Alert? Yes  Normal Appearance?Yes  Oriented to person? Yes  Place? Yes   Time? Yes  Recall of three objects?  Yes  Can perform simple calculations? Yes  Displays appropriate judgment?Yes  Can read the correct time from a watch face?Yes   Advanced Directives have been discussed with the patient? Yes  List the Names of Other Physician/Practitioners you currently use: 1.    Indicate any recent Medical Services you may have received from other than [facility] providers in the past year (date may be approximate).  Immunization History  Administered Date(s) Administered  . Influenza Whole 05/02/2001, 01/27/2008, 01/12/2010, 12/31/2010, 01/25/2012  . Influenza-Unspecified 02/03/2013  . Meningococcal Polysaccharide 03/21/2012  . PPD Test 11/18/2013  . Pneumococcal Conjugate-13 03/20/2013  . Pneumococcal Polysaccharide-23 05/02/2000, 06/23/2010  . Td 05/01/2006  . Zoster 01/17/2006    Screening Tests Health Maintenance  Topic Date Due  .  Influenza Vaccine  11/29/2013  . Mammogram  11/29/2014  . Colonoscopy  12/27/2015  . Tetanus/tdap  05/01/2016  . Pneumococcal Polysaccharide Vaccine Age 58 And Over  Completed  . Zostavax  Completed    All answers were reviewed with the patient and necessary referrals were made:  Carmelina Paddock, NP   12/09/2013   History reviewed: allergies, current medications, past family history, past medical history, past social history, past surgical history and problem list      Objective:     Vision by Snellen chart: right [id number] wear glasses for reading , left eye:20/20  Body mass index is 25.87 kg/(m^2). BP 98/62  Pulse 98  Temp(Src) 97.6 F (36.4 C) (Oral)  Ht 5' 3.5" (1.613 m)  Wt 148 lb 6.4 oz (67.314 kg)  BMI 25.87 kg/m2  SpO2 96%   [facility] care team. 1. Dr. Ellyn Hack, 2. Dr. Charlestine Night 3.  Dr. Trula Slade  4.  Dr. Zadie Rhine 5. Dr. Kathrin Penner 6. Dr. Valentino Saxon 7.  Dr. Girtha Rm     Assessment:     **Patient presents for yearly preventative medicine examination. Medicare questionnaire was completed  All immunizations and health maintenance protocols were reviewed with the patient and needed orders were placed.  Appropriate screening laboratory values were ordered for the patient including screening of hyperlipidemia, renal function and hepatic function. If indicated by BPH, a PSA was ordered. Patients health care team reviewed with patient  Medication reconciliation,  past medical history, social history, problem list and allergies were reviewed in detail with the patient  Goals were established with regard to weight loss, exercise, and  diet in compliance with medications  End of life planning was discussed.    BP re checked 110/70   Plan:     During the course of the visit the patient was educated and counseled about appropriate screening and preventive services including:    Pneumococcal vaccine  Td vaccine  Nutrition counseling   Advanced directives: has an  advanced directive - a copy HAS NOT been provided.  Diet review for nutrition referral? Yes ____  Not Indicated _no___   Patient Instructions (the written plan) was given to the patient.  Medicare Attestation I have personally reviewed: The patient's medical and social history Their use of alcohol, tobacco or illicit drugs Their current medications and supplements The patient's functional ability including ADLs,fall risks, home safety risks, cognitive, and hearing and visual impairment Diet and physical activities Evidence for depression or mood disorders  The patient's weight, height, BMI, and visual acuity have been recorded in the chart.  I have made referrals, counseling, and provided education to the patient based on review of the above and I have provided the patient with a written personalized care plan for preventive services.     Steva Colder A, NP   12/09/2013

## 2013-12-09 NOTE — Progress Notes (Signed)
Pre visit review using our clinic review tool, if applicable. No additional management support is needed unless otherwise documented below in the visit note. 

## 2013-12-09 NOTE — Patient Instructions (Signed)
Provide a copy of your living will.  Keep record of BP Keep follow up appoint with Dr. Alain Marion Call clinic with any questions or concerns.

## 2013-12-12 ENCOUNTER — Encounter: Payer: Self-pay | Admitting: Internal Medicine

## 2013-12-12 ENCOUNTER — Telehealth: Payer: Self-pay | Admitting: Internal Medicine

## 2013-12-12 DIAGNOSIS — Z0279 Encounter for issue of other medical certificate: Secondary | ICD-10-CM

## 2013-12-12 NOTE — Telephone Encounter (Signed)
[facility] forms received and placed in providers folder for signatures.

## 2013-12-29 ENCOUNTER — Encounter: Payer: Self-pay | Admitting: Cardiology

## 2013-12-29 ENCOUNTER — Ambulatory Visit (INDEPENDENT_AMBULATORY_CARE_PROVIDER_SITE_OTHER): Payer: 59 | Admitting: Cardiology

## 2013-12-29 VITALS — BP 130/84 | HR 75 | Ht 63.0 in | Wt 151.0 lb

## 2013-12-29 DIAGNOSIS — I2510 Atherosclerotic heart disease of native coronary artery without angina pectoris: Secondary | ICD-10-CM

## 2013-12-29 DIAGNOSIS — E785 Hyperlipidemia, unspecified: Secondary | ICD-10-CM

## 2013-12-29 DIAGNOSIS — I10 Essential (primary) hypertension: Secondary | ICD-10-CM

## 2013-12-29 DIAGNOSIS — J309 Allergic rhinitis, unspecified: Secondary | ICD-10-CM

## 2013-12-29 DIAGNOSIS — I714 Abdominal aortic aneurysm, without rupture, unspecified: Secondary | ICD-10-CM

## 2013-12-29 DIAGNOSIS — K551 Chronic vascular disorders of intestine: Secondary | ICD-10-CM

## 2013-12-29 DIAGNOSIS — J302 Other seasonal allergic rhinitis: Secondary | ICD-10-CM

## 2013-12-29 DIAGNOSIS — Z951 Presence of aortocoronary bypass graft: Secondary | ICD-10-CM

## 2013-12-29 NOTE — Patient Instructions (Signed)
No changes  Continue with current medications.  Your physician wants you to follow-up in 12 months Dr Ellyn Hack.  You will receive a reminder letter in the mail two months in advance. If you don't receive a letter, please call our office to schedule the follow-up appointment.

## 2013-12-31 DIAGNOSIS — J302 Other seasonal allergic rhinitis: Secondary | ICD-10-CM | POA: Insufficient documentation

## 2013-12-31 NOTE — Assessment & Plan Note (Signed)
Recommended use of antihistamine agents that are nonsedative and not with "decongestant"

## 2013-12-31 NOTE — Assessment & Plan Note (Signed)
Not quite at goal based on NMR --close to goal by screening labs. Would continue current dose of statin and closely monitor.

## 2013-12-31 NOTE — Assessment & Plan Note (Signed)
Stable blood pressure on current regimen.

## 2013-12-31 NOTE — Progress Notes (Signed)
PCP: [name], MD  Clinic Note: Chief Complaint  Patient presents with  . Annual Exam    no chest pain , slight sob walking up stairs    HPI: [name] is a 73 y.o. 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 This was followed by ruptured AAA status post open repair. Since her CABG, she's actually had several catheterizations and shown improved left main flow an atretic LIMA.  Interval History: Since I last saw her in August, she's been doing very cardiac standpoint. She denies any significant symptoms of chest tightness or pressure at rest or exertion. No exertional dyspnea with routine exertion, she has "her usual shortness of breath when she over does it ". No PND, orthopnea or edema. She denies any postprandial abdominal pain. No claudication. She denies sensation of rapid heartbeat irregular heartbeat/palpitations or arrhythmias. No syncope or near syncope, TIA or amaurosis fugax symptoms. She denies any melena, hematochezia or hematuria.  Right now she has not been as active as she would like to be because she's been having significant pain along the right leg the hip to the thigh. From her arthritis-type pains. She is also noted that her seasonal allergies have picked up a bit sooner than usual and is having a lot of nasal congestion with sinus congestion and nose dripping.  Past Medical History  Diagnosis Date  . Hypothyroidism (acquired)     hypo  . Cervical disc disease   . Polymyalgia rheumatica     2011 Dr. Charlestine Night  . Anemia   . Hyperlipidemia   . S/P CABG x 1 1996    CABG--LIMA-LAD for ostial LAD (not felt to be PCI amenable). EF NORMAL then; LIMA now atretic  . Abdominal aortic aneurysm     REPAIRED IN 1993 BY [name]  AND HAS RECENTLY BEEN FOLLOWED BY [name]  . 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 Rheumatoid arthritis 2011    Dr.Truslow; fracture knees, hands and wrists -   . CAD in native artery 1996, 2002, 2005     Status post CABG x1 with LIMA-LAD for 90% stenosis in the ostial LAD reduced to 50% in 2002 and 30% in 2005.;  Atretic LIMA  . COPD (chronic obstructive pulmonary disease)     Prior Cardiac Evaluation and Past Surgical History: Past Surgical History  Procedure Laterality Date  . Splenectomy - following AAA repair     . Cardiac catheterization: Atretic LIMA, 30% ostial LAD (initially had been 90%)     . Cervical spine surgery      plate 2008 Dr. Saintclair Halsted  . Coronary artery bypass graft  1996    INCLUDED AN INTERNAL MAMMARY ARTERY TO THE LAD. EF WAS NORMAL  . Abdominal aortic aneurysm repair  99991111    Complicated by mesenteric artery stenosis and splenic artery infarction with acquired Asplenia   MEDICATIONS AND ALLERGIES REVIEWED IN EPIC No Change in Social and Family History  ROS: A comprehensive Review of Systems - Negative except symptoms noted above  Wt Readings from Last 3 Encounters:  12/29/13 151 lb (68.493 kg)  12/09/13 148 lb 6.4 oz (67.314 kg)  11/18/13 148 lb 3.2 oz (67.223 kg)    PHYSICAL EXAM BP 130/84  Pulse 75  Ht 5\' 3"  (1.6 m)  Wt 151  lb (68.493 kg)  BMI 26.76 kg/m2 General appearance: alert, cooperative, appears stated age, no distress and Healthy-appearing. Answers questions appropriately  Neck: no adenopathy, no carotid bruit, no JVD and supple, symmetrical, trachea midline  Lungs: clear to auscultation bilaterally, normal percussion bilaterally and Nonlabored, and good air movement  Heart: regular rate and rhythm, S1, S2 normal, no murmur, click, rub or gallop and prominent apical impulse  Abdomen: soft, non-tender; bowel sounds normal; no masses, no organomegaly and Well healed abdominal aneurysm scar. No pulsatile mass. No obvious bruit  Extremities: extremities normal, atraumatic, no cyanosis or edema, varicose  veins noted, venous stasis dermatitis noted and An extensive spider veins noted; more prominent on left than right  Pulses: 2+ and symmetric  Neurologic: Grossly normal  HEENT: [place]/AT, EOMI, MMM, anicteric sclera   Adult ECG Report  Rate: 75 ;  Rhythm: normal sinus rhythm; normal EKG  Recent Labs in June 2015::   Total cholesterol 193, HDL 82, LDL 82, triglycerides 93.  NMR  November 2014: HDL particle #50.9, size 9.9, HDL- P 15.2 = normal; large VLDL-P 5.6, VLDL size 49 point LDL particle #1303 = high, LDL size 20.6;  LP-IR score 39, small LDL particle 369 good  The LDL particle number does not correlate with measured LDL  ASSESSMENT / PLAN: Atherosclerosis of coronary artery without angina pectoris Overall stable with no active anginal symptoms. Continue on Plavix in the middle of aspirin, ARB and beta blocker along with statin.  Essential hypertension Stable blood pressure on current regimen.  Hyperlipidemia with target LDL less than 70 Not quite at goal based on NMR --close to goal by screening labs. Would continue current dose of statin and closely monitor.  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  She has been referred back to vascular surgery to reassess based on the CT scan from September 2013. I didn't get a sense of having too many postprandial symptoms, but did not tell in depth since I knew she was going to see vascular surgery.  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) Followup with Dr. Trula Slade will also monitor mesenteric ischemia  Seasonal allergies Recommended use of antihistamine agents that are nonsedative and not with "decongestant"    Orders Placed This Encounter  Procedures  . EKG 12-Lead    Order Specific Question:  Where should this test be performed    Answer:  OTHER   No orders of the defined types were placed in this encounter.    Followup: One year months  DAVID W.  Ellyn Hack, M.D., M.S. Interventional Cardiologist [facility]

## 2013-12-31 NOTE — Assessment & Plan Note (Signed)
Followup with Dr. Trula Slade will also monitor mesenteric ischemia

## 2013-12-31 NOTE — Assessment & Plan Note (Signed)
Overall stable with no active anginal symptoms. Continue on Plavix in the middle of aspirin, ARB and beta blocker along with statin.

## 2013-12-31 NOTE — Assessment & Plan Note (Signed)
This is probably related to her AAA atherosclerotic disease.  She has been referred back to vascular surgery to reassess based on the CT scan from September 2013. I didn't get a sense of having too many postprandial symptoms, but did not tell in depth since I knew she was going to see vascular surgery.

## 2014-01-17 ENCOUNTER — Encounter: Payer: Self-pay | Admitting: Internal Medicine

## 2014-02-26 ENCOUNTER — Encounter: Payer: Self-pay | Admitting: Internal Medicine

## 2014-02-26 ENCOUNTER — Ambulatory Visit (INDEPENDENT_AMBULATORY_CARE_PROVIDER_SITE_OTHER): Payer: 59 | Admitting: Internal Medicine

## 2014-02-26 VITALS — BP 138/82 | HR 80 | Temp 97.8°F | Wt 151.0 lb

## 2014-02-26 DIAGNOSIS — E034 Atrophy of thyroid (acquired): Secondary | ICD-10-CM

## 2014-02-26 DIAGNOSIS — Z951 Presence of aortocoronary bypass graft: Secondary | ICD-10-CM

## 2014-02-26 DIAGNOSIS — E038 Other specified hypothyroidism: Secondary | ICD-10-CM

## 2014-02-26 DIAGNOSIS — I10 Essential (primary) hypertension: Secondary | ICD-10-CM

## 2014-02-26 DIAGNOSIS — M069 Rheumatoid arthritis, unspecified: Secondary | ICD-10-CM

## 2014-02-26 MED ORDER — LEFLUNOMIDE 20 MG PO TABS: 20.0000 mg | ORAL_TABLET | ORAL | Status: DC

## 2014-02-26 MED ORDER — AMOXICILLIN-POT CLAVULANATE 500-125 MG PO TABS: 1.0000 | ORAL_TABLET | Freq: Three times a day (TID) | ORAL | Status: DC

## 2014-02-26 NOTE — Assessment & Plan Note (Signed)
Continue with current prescription therapy as reflected on the Med list.  

## 2014-02-26 NOTE — Progress Notes (Signed)
Pre visit review using our clinic review tool, if applicable. No additional management support is needed unless otherwise documented below in the visit note. 

## 2014-02-26 NOTE — Progress Notes (Signed)
   Subjective:    HPI    The patient presents for a follow-up of  chronic hypertension, chronic dyslipidemia, CAD, RA controlled with medicines    BP Readings from Last 3 Encounters:  02/26/14 138/82  12/29/13 130/84  12/09/13 98/62   Wt Readings from Last 3 Encounters:  02/26/14 151 lb (68.493 kg)  12/29/13 151 lb (68.493 kg)  12/09/13 148 lb 6.4 oz (67.314 kg)     Review of Systems  Constitutional: Positive for fatigue. Negative for chills, activity change, appetite change and unexpected weight change.  HENT: Negative for congestion, mouth sores and sinus pressure.   Eyes: Negative for visual disturbance.  Respiratory: Negative for cough and chest tightness.   Cardiovascular: Negative for chest pain.  Gastrointestinal: Negative for nausea and abdominal pain.  Genitourinary: Negative for frequency, difficulty urinating and vaginal pain.  Musculoskeletal: Positive for arthralgias and back pain. Negative for gait problem and joint swelling.  Skin: Negative for pallor and rash.  Neurological: Negative for dizziness, tremors, weakness, numbness and headaches.  Psychiatric/Behavioral: Negative for confusion and sleep disturbance.       Objective:   Physical Exam  Constitutional: She appears well-developed and well-nourished. No distress.  HENT:  Head: Normocephalic.  Right Ear: External ear normal.  Left Ear: External ear normal.  Nose: Nose normal.  Mouth/Throat: Oropharynx is clear and moist.  Eyes: Conjunctivae are normal. Pupils are equal, round, and reactive to light. Right eye exhibits no discharge. Left eye exhibits no discharge.  Neck: Normal range of motion. Neck supple. No JVD present. No tracheal deviation present. No thyromegaly present.  Cardiovascular: Normal rate, regular rhythm and normal heart sounds.   Pulmonary/Chest: No stridor. No respiratory distress. She has no wheezes.  Abdominal: Soft. Bowel sounds are normal. She exhibits no distension and no  mass. There is no tenderness. There is no rebound and no guarding.  Musculoskeletal: She exhibits no edema and no tenderness.  Lymphadenopathy:    She has no cervical adenopathy.  Neurological: She displays normal reflexes. No cranial nerve deficit. She exhibits normal muscle tone. Coordination normal.  Skin: No rash noted. No erythema.  Psychiatric: She has a normal mood and affect. Her behavior is normal. Judgment and thought content normal.        Lab Results  Component Value Date   WBC 7.1 10/24/2013   HGB 13.0 10/24/2013   HCT 40.3 10/24/2013   PLT 347.0 10/24/2013   GLUCOSE 111* 10/24/2013   CHOL 183 10/24/2013   TRIG 93.0 10/24/2013   HDL 82.10 10/24/2013   LDLDIRECT 162.1 10/28/2009   LDLCALC 82 10/24/2013   ALT 28 10/24/2013   AST 30 10/24/2013   NA 142 10/24/2013   K 4.6 10/24/2013   CL 105 10/24/2013   CREATININE 1.3* 10/24/2013   BUN 25* 10/24/2013   CO2 29 10/24/2013   TSH 1.83 10/24/2013   INR 1.0 11/29/2006        Assessment & Plan:

## 2014-02-26 NOTE — Assessment & Plan Note (Signed)
Continue with current prescription therapy as reflected on the Med list. Arava qod

## 2014-03-06 ENCOUNTER — Encounter: Payer: Self-pay | Admitting: Internal Medicine

## 2014-03-23 ENCOUNTER — Ambulatory Visit: Payer: 59 | Admitting: Surgery

## 2014-03-23 ENCOUNTER — Other Ambulatory Visit: Payer: Medicare PPO

## 2014-04-06 ENCOUNTER — Other Ambulatory Visit: Payer: Self-pay | Admitting: Surgery

## 2014-04-07 ENCOUNTER — Other Ambulatory Visit: Payer: Self-pay | Admitting: Obstetrics

## 2014-04-07 DIAGNOSIS — Z1231 Encounter for screening mammogram for malignant neoplasm of breast: Secondary | ICD-10-CM

## 2014-04-07 DIAGNOSIS — E2839 Other primary ovarian failure: Secondary | ICD-10-CM

## 2014-04-07 LAB — BUN: BUN: 35 mg/dL — ABNORMAL HIGH (ref 6–23)

## 2014-04-07 LAB — CREATININE, SERUM: CREATININE: 1.51 mg/dL — AB (ref 0.50–1.10)

## 2014-04-08 ENCOUNTER — Other Ambulatory Visit: Payer: Self-pay | Admitting: *Deleted

## 2014-04-08 DIAGNOSIS — I728 Aneurysm of other specified arteries: Secondary | ICD-10-CM

## 2014-04-10 ENCOUNTER — Inpatient Hospital Stay: Admission: RE | Admit: 2014-04-10 | Payer: Medicare PPO | Source: Ambulatory Visit

## 2014-04-13 ENCOUNTER — Ambulatory Visit: Payer: Medicare PPO | Admitting: Surgery

## 2014-04-21 ENCOUNTER — Ambulatory Visit (HOSPITAL_COMMUNITY)
Admission: RE | Admit: 2014-04-21 | Discharge: 2014-04-21 | Disposition: A | Discharge status: Home / Self Care | Payer: Medicare Other | Source: Ambulatory Visit | Attending: Surgery | Admitting: Surgery

## 2014-04-21 DIAGNOSIS — I728 Aneurysm of other specified arteries: Secondary | ICD-10-CM | POA: Insufficient documentation

## 2014-04-28 ENCOUNTER — Encounter: Payer: Self-pay | Admitting: Surgery

## 2014-05-04 ENCOUNTER — Ambulatory Visit (INDEPENDENT_AMBULATORY_CARE_PROVIDER_SITE_OTHER): Payer: Medicare Other | Admitting: Surgery

## 2014-05-04 ENCOUNTER — Encounter: Payer: Self-pay | Admitting: Surgery

## 2014-05-04 VITALS — BP 90/60 | HR 92 | Temp 97.8°F | Resp 16 | Ht 63.0 in | Wt 152.0 lb

## 2014-05-04 DIAGNOSIS — I728 Aneurysm of other specified arteries: Secondary | ICD-10-CM

## 2014-05-04 DIAGNOSIS — I773 Arterial fibromuscular dysplasia: Secondary | ICD-10-CM

## 2014-05-04 DIAGNOSIS — I701 Atherosclerosis of renal artery: Secondary | ICD-10-CM

## 2014-05-04 NOTE — Addendum Note (Signed)
Addended by: Mena Goes on: 05/04/2014 04:53 PM   Modules accepted: Orders

## 2014-05-04 NOTE — Progress Notes (Signed)
Patient name: [name] MRN: [id number] DOB: 1940-06-29 Sex: female     Chief Complaint  Patient presents with  . Follow-up    2 year splenic artery aneurysm, pt has had no complaints    HISTORY OF PRESENT ILLNESS: The patient is back today because she recently underwent a workup for abdominal pain. I inhibited her from Dr. Amedeo Plenty in 2011. 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 Leafy Kindle in 1996 for rupture. She has been followed with MR A. since that time to evaluate her mesenteric and splenic aneurysms. 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 She has been treated in the past with steroids for polyarteritis or fibromuscular disease. Currently she is not on steroids.  She did recently move and during that timeframe, she took a brief course of steroids.  She does not have any difficulty with swallowing or postprandial pain.  Past Medical History  Diagnosis Date  . Hypothyroidism (acquired)     hypo  . Cervical disc disease   . Polymyalgia rheumatica     2011 Dr. Charlestine Night  . Anemia   . Hyperlipidemia   . S/P CABG x 1 1996    CABG--LIMA-LAD for ostial LAD (not felt to be PCI amenable). EF NORMAL then; LIMA now atretic  . Abdominal aortic aneurysm     REPAIRED IN 1993 BY [name]  AND HAS RECENTLY BEEN FOLLOWED BY [name]  . 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 Rheumatoid arthritis 2011    Dr.Truslow; fracture knees, hands and wrists -   . CAD in native artery 1996, 2002, 2005     Status post CABG x1 with LIMA-LAD for 90% stenosis in the ostial LAD reduced to 50% in 2002 and 30% in 2005.;  Atretic LIMA  . COPD (chronic obstructive pulmonary disease)     Past Surgical History  Procedure Laterality Date  . Splenectomy    . Cardiac catheterization    . Cervical spine  surgery      plate 2008 Dr. Saintclair Halsted  . Carpal tunnel release    . Cataract extraction    . Hernia repair    . Bunionectomy  07/2011    right foot  . Coronary artery bypass graft  1996    INCLUDED AN INTERNAL MAMMARY ARTERY TO THE LAD. EF WAS NORMAL  . 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 Eye surgery      History   Social History  . Marital Status: Married    Spouse Name: N/A    Number of Children: 4  . Years of Education: N/A   Occupational History  . retired    Social History Main Topics  . Smoking status: Former Smoker    Quit date: 12/04/1958  . Smokeless tobacco: Never Used  . Alcohol Use: 0.5 - 1.5 oz/week    1-3 drink(s) per week  . Drug Use: No  . Sexual Activity: Not Currently   Other Topics Concern  . Not on file   Social History Narrative   Regular [facility]- [facility]  [place] ; [place]. GOES TO [employer] 3 DAYS A WEEK FOR ABOUT 45 MIN. PER SESSION  Family History  Problem Relation Age of Onset  . Hypertension    . Coronary artery disease    . Asthma Neg Hx   . Colon cancer Neg Hx   . Hypertension Mother   . Diabetes Mother   . Heart disease Mother   . Hyperlipidemia Mother   . Stroke Father   . Hyperlipidemia Sister   . Hypertension Sister   . Hypertension Daughter   . Cancer Paternal Uncle     Deceased from cancer not sure of site  . Hypertension Son   . Hyperlipidemia Son   . Hyperlipidemia Son   . Hypertension Son   . Hyperlipidemia Son   . Hypertension Son     Allergies as of 05/04/2014 - Review Complete 05/04/2014  Allergen Reaction Noted  . Aspirin Other (See Comments)   . Codeine Nausea And Vomiting   . Nitroglycerin Other (See Comments) 11/08/2010    Current Outpatient Prescriptions on File Prior to Visit  Medication Sig Dispense Refill  . atorvastatin (LIPITOR) 40 MG tablet Take 1 tablet  (40 mg total) by mouth daily. 90 tablet 3  . clopidogrel (PLAVIX) 75 MG tablet Take 1 tablet (75 mg total) by mouth daily. 90 tablet 3  . leflunomide (ARAVA) 20 MG tablet Take 1 tablet (20 mg total) by mouth every other day. 15 tablet 6  . levothyroxine (LEVOXYL) 88 MCG tablet Take 1 tablet (88 mcg total) by mouth daily. 90 tablet 3  . losartan (COZAAR) 25 MG tablet Take 1 tablet (25 mg total) by mouth daily. 90 tablet 3  . metoprolol succinate (TOPROL-XL) 25 MG 24 hr tablet Take 1 tablet (25 mg total) by mouth daily. 90 tablet 3  . predniSONE (DELTASONE) 20 MG tablet continuous as needed. 3 tabs by mouth daily x 3 days, then 2 tabs by mouth daily x 2 days then 1 tab by mouth daily x 2 days    . psyllium (METAMUCIL) 58.6 % packet Take 1 packet by mouth daily.    [name] amoxicillin-clavulanate (AUGMENTIN) 500-125 MG per tablet Take 1 tablet (500 mg total) by mouth 3 (three) times daily. prn (Patient not taking: Reported on 05/04/2014) 30 tablet 2  . Calcium Carbonate-Vitamin D (CALCIUM-VITAMIN D) 500-200 MG-UNIT per tablet Take 1 tablet by mouth daily.    . cetirizine (ZYRTEC) 10 MG tablet Take 10 mg by mouth daily.    . Multiple Vitamins-Minerals (MULTIVITAL-M) TABS Take 0.5 tablets by mouth daily.     No current facility-administered medications on file prior to visit.     REVIEW OF SYSTEMS: Cardiovascular: No chest pain, chest pressure, palpitations, orthopnea, or dyspnea on exertion. No claudication or rest pain,  No history of DVT or phlebitis. Pulmonary: No productive cough, asthma or wheezing. Neurologic: No weakness, paresthesias, aphasia, or amaurosis. No dizziness. Hematologic: No bleeding problems or clotting disorders. Musculoskeletal: No joint pain or joint swelling. Gastrointestinal: No blood in stool or hematemesis Genitourinary: No dysuria or hematuria. Psychiatric:: No history of major depression. Integumentary: No rashes or ulcers. Constitutional: No fever or chills.  PHYSICAL  EXAMINATION:   Vital signs are BP 90/60 mmHg  Pulse 92  Temp(Src) 97.8 F (36.6 C) (Oral)  Resp 16  Ht 5\' 3"  (1.6 m)  Wt 152 lb (68.947 kg)  BMI 26.93 kg/m2  SpO2 99% General: The patient appears their stated age. HEENT:  No gross abnormalities Pulmonary:  Non labored breathing Abdomen: Soft and non-tender Musculoskeletal: There are no major deformities. Neurologic: No focal weakness or paresthesias  are detected, Skin: There are no ulcer or rashes noted. Psychiatric: The patient has normal affect. Cardiovascular: There is a regular rate and rhythm without significant murmur appreciated.   Diagnostic Studies 1. Limited by the absence of intravenous contrast. Noncontrast flow-based MRA imaging is difficult in this patient secondary to proximal occlusion of the celiac artery and resultant slow flow secondary to retrograde filling in the aneurysmal segments. Please note that intravenous gadolinium can be safely administered as long as the GFR remains above 30. 2. 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 The aneurysms again measure 9 and 8 mm. 3. 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 4. 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 5. Moderate stenosis of the proximal left renal artery. 6. 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  She is currently not taking steroids.  She has no complaints today.  Her aneurysmal changes remained stable on MR today.  I will have this repeated in one year.  The patient has had a slight change in her creatinine.  6 months ago it was 1.3.  Today is 1.5.  I stressed the importance of avoiding nephrotoxic medications including  ibuprofen.  Eldridge Abrahams, M.D. [facility] Office: [phone] Pager:  [phone]

## 2014-06-11 ENCOUNTER — Ambulatory Visit
Admission: RE | Admit: 2014-06-11 | Discharge: 2014-06-11 | Disposition: A | Discharge status: Home / Self Care | Payer: Medicare Other | Source: Ambulatory Visit | Attending: Obstetrics | Admitting: Obstetrics

## 2014-06-11 DIAGNOSIS — E2839 Other primary ovarian failure: Secondary | ICD-10-CM

## 2014-06-11 DIAGNOSIS — Z1231 Encounter for screening mammogram for malignant neoplasm of breast: Secondary | ICD-10-CM

## 2014-07-02 ENCOUNTER — Ambulatory Visit (INDEPENDENT_AMBULATORY_CARE_PROVIDER_SITE_OTHER): Payer: Medicare Other | Admitting: Internal Medicine

## 2014-07-02 VITALS — BP 138/80 | HR 98 | Temp 97.6°F | Wt 151.0 lb

## 2014-07-02 DIAGNOSIS — Z951 Presence of aortocoronary bypass graft: Secondary | ICD-10-CM

## 2014-07-02 DIAGNOSIS — I10 Essential (primary) hypertension: Secondary | ICD-10-CM

## 2014-07-02 DIAGNOSIS — D510 Vitamin B12 deficiency anemia due to intrinsic factor deficiency: Secondary | ICD-10-CM

## 2014-07-02 DIAGNOSIS — E039 Hypothyroidism, unspecified: Secondary | ICD-10-CM

## 2014-07-02 MED ORDER — CLOPIDOGREL BISULFATE 75 MG PO TABS: 75.0000 mg | ORAL_TABLET | Freq: Every day | ORAL | Status: DC

## 2014-07-02 MED ORDER — LOSARTAN POTASSIUM 25 MG PO TABS: 25.0000 mg | ORAL_TABLET | Freq: Every day | ORAL | Status: DC

## 2014-07-02 MED ORDER — METOPROLOL SUCCINATE ER 25 MG PO TB24: 25.0000 mg | ORAL_TABLET | Freq: Every day | ORAL | Status: DC

## 2014-07-02 MED ORDER — ATORVASTATIN CALCIUM 40 MG PO TABS: 40.0000 mg | ORAL_TABLET | Freq: Every day | ORAL | Status: DC

## 2014-07-02 MED ORDER — LEVOTHYROXINE SODIUM 88 MCG PO TABS: 88.0000 ug | ORAL_TABLET | Freq: Every day | ORAL | Status: DC

## 2014-07-02 NOTE — Assessment & Plan Note (Signed)
Continue with current prescription Levothroid therapy as reflected on the Med list.

## 2014-07-02 NOTE — Assessment & Plan Note (Signed)
Cont Plavix, Losartan, Metoprolol

## 2014-07-02 NOTE — Assessment & Plan Note (Signed)
Check labs [name] is not taking B12

## 2014-07-02 NOTE — Progress Notes (Signed)
Pre visit review using our clinic review tool, if applicable. No additional management support is needed unless otherwise documented below in the visit note. 

## 2014-07-02 NOTE — Progress Notes (Signed)
   Subjective:    HPI    The patient presents for a follow-up of  chronic hypertension, chronic dyslipidemia, CAD, RA controlled with medicines Labs per Rhematology    BP Readings from Last 3 Encounters:  07/02/14 138/80  05/04/14 90/60  02/26/14 138/82   Wt Readings from Last 3 Encounters:  07/02/14 151 lb (68.493 kg)  05/04/14 152 lb (68.947 kg)  04/21/14 146 lb (66.225 kg)     Review of Systems  Constitutional: Positive for fatigue. Negative for chills, activity change, appetite change and unexpected weight change.  HENT: Negative for congestion, mouth sores and sinus pressure.   Eyes: Negative for visual disturbance.  Respiratory: Negative for cough and chest tightness.   Cardiovascular: Negative for chest pain.  Gastrointestinal: Negative for nausea and abdominal pain.  Genitourinary: Negative for frequency, difficulty urinating and vaginal pain.  Musculoskeletal: Positive for back pain and arthralgias. Negative for joint swelling and gait problem.  Skin: Negative for pallor and rash.  Neurological: Negative for dizziness, tremors, weakness, numbness and headaches.  Psychiatric/Behavioral: Negative for confusion and sleep disturbance.       Objective:   Physical Exam  Constitutional: She appears well-developed and well-nourished. No distress.  HENT:  Head: Normocephalic.  Right Ear: External ear normal.  Left Ear: External ear normal.  Nose: Nose normal.  Mouth/Throat: Oropharynx is clear and moist.  Eyes: Conjunctivae are normal. Pupils are equal, round, and reactive to light. Right eye exhibits no discharge. Left eye exhibits no discharge.  Neck: Normal range of motion. Neck supple. No JVD present. No tracheal deviation present. No thyromegaly present.  Cardiovascular: Normal rate, regular rhythm and normal heart sounds.   Pulmonary/Chest: No stridor. No respiratory distress. She has no wheezes.  Abdominal: Soft. Bowel sounds are normal. She exhibits no  distension and no mass. There is no tenderness. There is no rebound and no guarding.  Musculoskeletal: She exhibits no edema or tenderness.  Lymphadenopathy:    She has no cervical adenopathy.  Neurological: She displays normal reflexes. No cranial nerve deficit. She exhibits normal muscle tone. Coordination normal.  Skin: No rash noted. No erythema.  Psychiatric: She has a normal mood and affect. Her behavior is normal. Judgment and thought content normal.        Lab Results  Component Value Date   WBC 7.1 10/24/2013   HGB 13.0 10/24/2013   HCT 40.3 10/24/2013   PLT 347.0 10/24/2013   GLUCOSE 111* 10/24/2013   CHOL 183 10/24/2013   TRIG 93.0 10/24/2013   HDL 82.10 10/24/2013   LDLDIRECT 162.1 10/28/2009   LDLCALC 82 10/24/2013   ALT 28 10/24/2013   AST 30 10/24/2013   NA 142 10/24/2013   K 4.6 10/24/2013   CL 105 10/24/2013   CREATININE 1.51* 04/06/2014   BUN 35* 04/06/2014   CO2 29 10/24/2013   TSH 1.83 10/24/2013   INR 1.0 11/29/2006        Assessment & Plan:

## 2014-07-02 NOTE — Assessment & Plan Note (Addendum)
  Cont Losartan, Metoprolol

## 2014-09-29 ENCOUNTER — Encounter: Payer: Self-pay | Admitting: Internal Medicine

## 2014-09-29 ENCOUNTER — Ambulatory Visit (INDEPENDENT_AMBULATORY_CARE_PROVIDER_SITE_OTHER): Payer: Medicare Other | Admitting: Internal Medicine

## 2014-09-29 VITALS — BP 132/86 | HR 94 | Temp 97.8°F | Resp 17

## 2014-09-29 DIAGNOSIS — M545 Low back pain, unspecified: Secondary | ICD-10-CM

## 2014-09-29 MED ORDER — GABAPENTIN 100 MG PO CAPS: ORAL_CAPSULE | ORAL | Status: DC

## 2014-09-29 MED ORDER — HYDROCODONE-ACETAMINOPHEN 10-325 MG PO TABS: 1.0000 | ORAL_TABLET | Freq: Four times a day (QID) | ORAL | Status: DC | PRN

## 2014-09-29 NOTE — Progress Notes (Signed)
   Subjective:    Patient ID: [name], female    DOB: 1940-09-28, 74 y.o.   MRN: [id number]  HPI  Her symptoms began 6 days ago as severe lumbar pain when she attempted to get out of bed. It's rated as a 10 out of 10. She been using Tylenol as well as two tramadol at a time with only partial relief. Once she is able to ambulate the pain is described as aching. There is some radiation anteriorly into the lower quadrants described as "muscle pain"..  Significant past history includes intermittent prednisone for possibly 6 months total for rheumatoid arthritis.  She had a bone density 06/11/14 @ her Gynecologist's office. The T score in the spine was 1.1 and at the hip 0.7. The images were not available for review.  Her last TSH on record was 1.83 in June 2015.  Significant past history is a fall down a flight of stairs with resultant cervical fracture. She has plate and screws in the cervical spine.  Review of Systems   Vertigo, near syncope or imbalance denied. There is no numbness, tingling, or weakness in extremities.   No loss of control of bladder or bowels. Radicular type pain into the lower extremities is absent.      Objective:   Physical Exam  Pertinent or positive findings include: She has bilateral ptosis of the eyes.  An S4 is present.  She has severe pain as she tries to stand from the chair.She uses a cane to help stand up but not to ambulate. There is tenderness to light percussion over the lumbar spine.  General appearance :adequately nourished; in no distress. Eyes: No conjunctival inflammation or scleral icterus is present. Heart:  Normal rate and regular rhythm. S1 and S2 normal without gallop, murmur, click, rub or other extra sounds  Lungs:Chest clear to auscultation; no wheezes, rhonchi,rales ,or rubs present.No increased work of breathing. With sitting up she had tachypnea with RR of 22. Abdomen: bowel sounds normal, soft and non-tender without masses,  organomegaly or hernias noted.  No guarding or rebound.  Vascular : all pulses equal ; no bruits present. Skin:Warm & dry.  Intact without suspicious lesions or rashes ; no tenting or jaundice  Lymphatic: No lymphadenopathy is noted about the head, neck, axilla Neuro: Strength, tone decreased.Negative SLR bilaterally when examined sitting.       Assessment & Plan:  #1 acute low back pain with possible bilateral radicular component. This is in the context of a T score of 1.1 on the lumbar spine bone density in February of this year. Lumbosacral spine films ordered. It may be of value to visualize the actual images from the bone density study as scoliosis or osteophytes may artificially elevate the T score.  Narcotic pain medicine will be prescribed as she states that she has been able take this in the past. Gabapentin at bedtime also be prescribed.

## 2014-09-29 NOTE — Patient Instructions (Signed)
  Your next office appointment will be determined based upon review of your pending xrays  Those written interpretation of the lab results and instructions will be transmitted to you by My Chart   Critical results will be called.   Followup as needed for any active or acute issue. Please report any significant change in your symptoms.

## 2014-09-29 NOTE — Progress Notes (Signed)
Pre visit review using our clinic review tool, if applicable. No additional management support is needed unless otherwise documented below in the visit note. 

## 2014-09-30 ENCOUNTER — Ambulatory Visit (INDEPENDENT_AMBULATORY_CARE_PROVIDER_SITE_OTHER)
Admission: RE | Admit: 2014-09-30 | Discharge: 2014-09-30 | Disposition: A | Discharge status: Home / Self Care | Payer: Medicare Other | Source: Ambulatory Visit | Attending: Internal Medicine | Admitting: Internal Medicine

## 2014-09-30 DIAGNOSIS — M545 Low back pain, unspecified: Secondary | ICD-10-CM

## 2014-10-01 ENCOUNTER — Telehealth: Payer: Self-pay | Admitting: Internal Medicine

## 2014-10-01 NOTE — Telephone Encounter (Signed)
Pt would like to know the results of her xray she had yesterday  Best number [phone]

## 2014-10-03 ENCOUNTER — Encounter: Payer: Self-pay | Admitting: Internal Medicine

## 2014-10-05 ENCOUNTER — Other Ambulatory Visit: Payer: Self-pay | Admitting: Internal Medicine

## 2014-10-05 DIAGNOSIS — M5136 Other intervertebral disc degeneration, lumbar region: Secondary | ICD-10-CM

## 2014-10-05 DIAGNOSIS — M4716 Other spondylosis with myelopathy, lumbar region: Secondary | ICD-10-CM

## 2014-10-05 NOTE — Telephone Encounter (Signed)
Please advise, thanks.

## 2014-10-05 NOTE — Telephone Encounter (Signed)
See report she read

## 2014-10-23 ENCOUNTER — Encounter: Payer: Self-pay | Admitting: Internal Medicine

## 2014-11-17 ENCOUNTER — Telehealth: Payer: Self-pay

## 2014-11-17 NOTE — Telephone Encounter (Signed)
Patient on the schedule to see post visit; Call made and [name] stated she would be glad to come in around 9am prior to her 9:45 apt with Dr. Alain Marion.

## 2014-11-19 ENCOUNTER — Ambulatory Visit: Payer: Medicare Other | Admitting: Internal Medicine

## 2014-11-26 ENCOUNTER — Encounter: Payer: Self-pay | Admitting: Internal Medicine

## 2014-11-26 ENCOUNTER — Other Ambulatory Visit (INDEPENDENT_AMBULATORY_CARE_PROVIDER_SITE_OTHER): Payer: Medicare Other

## 2014-11-26 ENCOUNTER — Ambulatory Visit (INDEPENDENT_AMBULATORY_CARE_PROVIDER_SITE_OTHER): Payer: Medicare Other | Admitting: Internal Medicine

## 2014-11-26 VITALS — BP 138/80 | HR 100 | Temp 97.9°F | Ht 63.5 in | Wt 153.5 lb

## 2014-11-26 DIAGNOSIS — M069 Rheumatoid arthritis, unspecified: Secondary | ICD-10-CM

## 2014-11-26 DIAGNOSIS — Z79899 Other long term (current) drug therapy: Secondary | ICD-10-CM | POA: Diagnosis not present

## 2014-11-26 DIAGNOSIS — D510 Vitamin B12 deficiency anemia due to intrinsic factor deficiency: Secondary | ICD-10-CM

## 2014-11-26 DIAGNOSIS — I2510 Atherosclerotic heart disease of native coronary artery without angina pectoris: Secondary | ICD-10-CM

## 2014-11-26 DIAGNOSIS — Z0000 Encounter for general adult medical examination without abnormal findings: Secondary | ICD-10-CM | POA: Diagnosis not present

## 2014-11-26 DIAGNOSIS — R197 Diarrhea, unspecified: Secondary | ICD-10-CM | POA: Diagnosis not present

## 2014-11-26 DIAGNOSIS — I10 Essential (primary) hypertension: Secondary | ICD-10-CM

## 2014-11-26 DIAGNOSIS — E559 Vitamin D deficiency, unspecified: Secondary | ICD-10-CM

## 2014-11-26 LAB — CBC WITH DIFFERENTIAL/PLATELET
BASOS ABS: 0 10*3/uL (ref 0.0–0.1)
BASOS PCT: 0 % (ref 0.0–3.0)
Eosinophils Absolute: 0.2 10*3/uL (ref 0.0–0.7)
Eosinophils Relative: 2.8 % (ref 0.0–5.0)
HEMATOCRIT: 40.1 % (ref 36.0–46.0)
HEMOGLOBIN: 13.3 g/dL (ref 12.0–15.0)
Lymphocytes Relative: 14.2 % (ref 12.0–46.0)
Lymphs Abs: 1.2 10*3/uL (ref 0.7–4.0)
MCHC: 33 g/dL (ref 30.0–36.0)
MCV: 93.5 fl (ref 78.0–100.0)
Monocytes Absolute: 1.3 10*3/uL — ABNORMAL HIGH (ref 0.1–1.0)
Monocytes Relative: 15.4 % — ABNORMAL HIGH (ref 3.0–12.0)
NEUTROS ABS: 5.8 10*3/uL (ref 1.4–7.7)
Neutrophils Relative %: 67.6 % (ref 43.0–77.0)
PLATELETS: 347 10*3/uL (ref 150.0–400.0)
RBC: 4.29 Mil/uL (ref 3.87–5.11)
RDW: 15.6 % — ABNORMAL HIGH (ref 11.5–15.5)
WBC: 8.6 10*3/uL (ref 4.0–10.5)

## 2014-11-26 LAB — LIPID PANEL
Cholesterol: 168 mg/dL (ref 0–200)
HDL: 75.1 mg/dL (ref >39.00)
LDL Cholesterol: 71 mg/dL (ref 0–99)
NONHDL: 93.27
Total CHOL/HDL Ratio: 2
Triglycerides: 111 mg/dL (ref 0.0–149.0)
VLDL: 22.2 mg/dL (ref 0.0–40.0)

## 2014-11-26 LAB — URINALYSIS
Bilirubin Urine: NEGATIVE
HGB URINE DIPSTICK: NEGATIVE
Ketones, ur: NEGATIVE
Leukocytes, UA: NEGATIVE
NITRITE: NEGATIVE
Specific Gravity, Urine: 1.015 (ref 1.000–1.030)
TOTAL PROTEIN, URINE-UPE24: NEGATIVE
URINE GLUCOSE: NEGATIVE
UROBILINOGEN UA: 0.2 (ref 0.0–1.0)
pH: 6 (ref 5.0–8.0)

## 2014-11-26 LAB — BASIC METABOLIC PANEL
BUN: 30 mg/dL — ABNORMAL HIGH (ref 6–23)
CO2: 29 meq/L (ref 19–32)
Calcium: 9.6 mg/dL (ref 8.4–10.5)
Chloride: 105 mEq/L (ref 96–112)
Creatinine, Ser: 1.37 mg/dL — ABNORMAL HIGH (ref 0.40–1.20)
GFR: 39.8 mL/min — AB (ref >60.00)
Glucose, Bld: 87 mg/dL (ref 70–99)
Potassium: 4.5 mEq/L (ref 3.5–5.1)
Sodium: 140 mEq/L (ref 135–145)

## 2014-11-26 LAB — HEPATIC FUNCTION PANEL
ALT: 34 U/L (ref 0–35)
AST: 29 U/L (ref 0–37)
Albumin: 4.1 g/dL (ref 3.5–5.2)
Alkaline Phosphatase: 161 U/L — ABNORMAL HIGH (ref 39–117)
Bilirubin, Direct: 0.2 mg/dL (ref 0.0–0.3)
Total Bilirubin: 0.5 mg/dL (ref 0.2–1.2)
Total Protein: 6.9 g/dL (ref 6.0–8.3)

## 2014-11-26 LAB — VITAMIN B12: Vitamin B-12: 737 pg/mL (ref 211–911)

## 2014-11-26 LAB — VITAMIN D 25 HYDROXY (VIT D DEFICIENCY, FRACTURES): VITD: 43.57 ng/mL (ref 30.00–100.00)

## 2014-11-26 LAB — TSH: TSH: 0.93 u[IU]/mL (ref 0.35–4.50)

## 2014-11-26 MED ORDER — ALIGN 4 MG PO CAPS: 1.0000 | ORAL_CAPSULE | Freq: Every day | ORAL | Status: DC

## 2014-11-26 NOTE — Assessment & Plan Note (Signed)
Here for medicare wellness/physical  Diet: heart healthy  Physical activity: not sedentary  Depression/mood screen: negative  Hearing: intact to whispered voice  Visual acuity: grossly normal, performs annual eye exam  ADLs: capable  Fall risk: low Home safety: good  Cognitive evaluation: intact to orientation, naming, recall and repetition  EOL planning: adv directives, full code/ I agree  I have personally reviewed and have noted  1. The patient's medical, surgical and social history  2. Their use of alcohol, tobacco or illicit drugs  3. Their current medications and supplements  4. The patient's functional ability including ADL's, fall risks, home safety risks and hearing or visual impairment.  5. Diet and physical activities  6. Evidence for depression or mood disorders 7. The roster of all physicians providing medical care to patient - is listed in the Snapshot section of the chart and reviewed today.    Today patient counseled on age appropriate routine health concerns for screening and prevention, each reviewed and up to date or declined. Immunizations reviewed and up to date or declined. Labs ordered and reviewed. Risk factors for depression reviewed and negative. Hearing function and visual acuity are intact. ADLs screened and addressed as needed. Functional ability and level of safety reviewed and appropriate. Education, counseling and referrals performed based on assessed risks today. Patient provided with a copy of personalized plan for preventive services.       

## 2014-11-26 NOTE — Assessment & Plan Note (Signed)
Off Prednisone. On Arava 

## 2014-11-26 NOTE — Assessment & Plan Note (Signed)
Losartan, Metoprolol

## 2014-11-26 NOTE — Patient Instructions (Addendum)
Fall Prevention and Home Safety Falls cause injuries and can affect all age groups. It is possible to prevent falls.  HOW TO PREVENT FALLS  Wear shoes with rubber soles that do not have an opening for your toes.  Keep the inside and outside of your house well lit.  Use night lights throughout your home.  Remove clutter from floors.  Clean up floor spills.  Remove throw rugs or fasten them to the floor with carpet tape.  Do not place electrical cords across pathways.  Put grab bars by your tub, shower, and toilet. Do not use towel bars as grab bars.  Put handrails on both sides of the stairway. Fix loose handrails.  Do not climb on stools or stepladders, if possible.  Do not wax your floors.  Repair uneven or unsafe sidewalks, walkways, or stairs.  Keep items you use a lot within reach.  Be aware of pets.  Keep emergency numbers next to the telephone.  Put smoke detectors in your home and near bedrooms. Ask your doctor what other things you can do to prevent falls. Document Released: 02/11/2009 Document Revised: 10/17/2011 Document Reviewed: 07/18/2011 [facility] Patient Information 2015 [place], [place]. This information is not intended to replace advice given to you by your health care provider. Make sure you discuss any questions you have with your health care provider.  Health Maintenance Adopting a healthy lifestyle and getting preventive care can go a long way to promote health and wellness. Talk with your health care provider about what schedule of regular examinations is right for you. This is a good chance for you to check in with your provider about disease prevention and staying healthy. In between checkups, there are plenty of things you can do on your own. Experts have done a lot of research about which lifestyle changes and preventive measures are most likely to keep you healthy. Ask your health care provider for more information. WEIGHT AND DIET  Eat a healthy  diet  Be sure to include plenty of vegetables, fruits, low-fat dairy products, and lean protein.  Do not eat a lot of foods high in solid fats, added sugars, or salt.  Get regular exercise. This is one of the most important things you can do for your health.  Most adults should exercise for at least 150 minutes each week. The exercise should increase your heart rate and make you sweat (moderate-intensity exercise).  Most adults should also do strengthening exercises at least twice a week. This is in addition to the moderate-intensity exercise.  Maintain a healthy weight  Body mass index (BMI) is a measurement that can be used to identify possible weight problems. It estimates body fat based on height and weight. Your health care provider can help determine your BMI and help you achieve or maintain a healthy weight.  For females 29 years of age and older:   A BMI below 18.5 is considered underweight.  A BMI of 18.5 to 24.9 is normal.  A BMI of 25 to 29.9 is considered overweight.  A BMI of 30 and above is considered obese.  Watch levels of cholesterol and blood lipids  You should start having your blood tested for lipids and cholesterol at 76 years of age, then have this test every 5 years.  You may need to have your cholesterol levels checked more often if:  Your lipid or cholesterol levels are high.  You are older than 76 years of age.  You are at high risk for  heart disease.  CANCER SCREENING   Lung Cancer  Lung cancer screening is recommended for adults 34-57 years old who are at high risk for lung cancer because of a history of smoking.  A yearly low-dose CT scan of the lungs is recommended for people who:  Currently smoke.  Have quit within the past 15 years.  Have at least a 30-pack-year history of smoking. A pack year is smoking an average of one pack of cigarettes a day for 1 year.  Yearly screening should continue until it has been 15 years since you  quit.  Yearly screening should stop if you develop a health problem that would prevent you from having lung cancer treatment.  Breast Cancer  Practice breast self-awareness. This means understanding how your breasts normally appear and feel.  It also means doing regular breast self-exams. Let your health care provider know about any changes, no matter how small.  If you are in your 20s or 30s, you should have a clinical breast exam (CBE) by a health care provider every 1-3 years as part of a regular health exam.  If you are 90 or older, have a CBE every year. Also consider having a breast X-ray (mammogram) every year.  If you have a family history of breast cancer, talk to your health care provider about genetic screening.  If you are at high risk for breast cancer, talk to your health care provider about having an MRI and a mammogram every year.  Breast cancer gene (BRCA) assessment is recommended for women who have family members with BRCA-related cancers. BRCA-related cancers include:  Breast.  Ovarian.  Tubal.  Peritoneal cancers.  Results of the assessment will determine the need for genetic counseling and BRCA1 and BRCA2 testing. Cervical Cancer Routine pelvic examinations to screen for cervical cancer are no longer recommended for nonpregnant women who are considered low risk for cancer of the pelvic organs (ovaries, uterus, and vagina) and who do not have symptoms. A pelvic examination may be necessary if you have symptoms including those associated with pelvic infections. Ask your health care provider if a screening pelvic exam is right for you.   The Pap test is the screening test for cervical cancer for women who are considered at risk.  If you had a hysterectomy for a problem that was not cancer or a condition that could lead to cancer, then you no longer need Pap tests.  If you are older than 65 years, and you have had normal Pap tests for the past 10 years, you no  longer need to have Pap tests.  If you have had past treatment for cervical cancer or a condition that could lead to cancer, you need Pap tests and screening for cancer for at least 20 years after your treatment.  If you no longer get a Pap test, assess your risk factors if they change (such as having a new sexual partner). This can affect whether you should start being screened again.  Some women have medical problems that increase their chance of getting cervical cancer. If this is the case for you, your health care provider may recommend more frequent screening and Pap tests.  The human papillomavirus (HPV) test is another test that may be used for cervical cancer screening. The HPV test looks for the virus that can cause cell changes in the cervix. The cells collected during the Pap test can be tested for HPV.  The HPV test can be used to screen women 30  years of age and older. Getting tested for HPV can extend the interval between normal Pap tests from three to five years.  An HPV test also should be used to screen women of any age who have unclear Pap test results.  After 76 years of age, women should have HPV testing as often as Pap tests.  Colorectal Cancer  This type of cancer can be detected and often prevented.  Routine colorectal cancer screening usually begins at 76 years of age and continues through 76 years of age.  Your health care provider may recommend screening at an earlier age if you have risk factors for colon cancer.  Your health care provider may also recommend using home test kits to check for hidden blood in the stool.  A small camera at the end of a tube can be used to examine your colon directly (sigmoidoscopy or colonoscopy). This is done to check for the earliest forms of colorectal cancer.  Routine screening usually begins at age 5.  Direct examination of the colon should be repeated every 5-10 years through 76 years of age. However, you may need to be  screened more often if early forms of precancerous polyps or small growths are found. Skin Cancer  Check your skin from head to toe regularly.  Tell your health care provider about any new moles or changes in moles, especially if there is a change in a mole's shape or color.  Also tell your health care provider if you have a mole that is larger than the size of a pencil eraser.  Always use sunscreen. Apply sunscreen liberally and repeatedly throughout the day.  Protect yourself by wearing long sleeves, pants, a wide-brimmed hat, and sunglasses whenever you are outside. HEART DISEASE, DIABETES, AND HIGH BLOOD PRESSURE   Have your blood pressure checked at least every 1-2 years. High blood pressure causes heart disease and increases the risk of stroke.  If you are between 29 years and 33 years old, ask your health care provider if you should take aspirin to prevent strokes.  Have regular diabetes screenings. This involves taking a blood sample to check your fasting blood sugar level.  If you are at a normal weight and have a low risk for diabetes, have this test once every three years after 76 years of age.  If you are overweight and have a high risk for diabetes, consider being tested at a younger age or more often. PREVENTING INFECTION  Hepatitis B  If you have a higher risk for hepatitis B, you should be screened for this virus. You are considered at high risk for hepatitis B if:  You were born in a country where hepatitis B is common. Ask your health care provider which countries are considered high risk.  Your parents were born in a high-risk country, and you have not been immunized against hepatitis B (hepatitis B vaccine).  You have HIV or AIDS.  You use needles to inject street drugs.  You live with someone who has hepatitis B.  You have had sex with someone who has hepatitis B.  You get hemodialysis treatment.  You take certain medicines for conditions, including  cancer, organ transplantation, and autoimmune conditions. Hepatitis C  Blood testing is recommended for:  Everyone born from [birth year] through 1967.  Anyone with known risk factors for hepatitis C. Sexually transmitted infections (STIs)  You should be screened for sexually transmitted infections (STIs) including gonorrhea and chlamydia if:  You are sexually active and are  younger than 76 years of age.  You are older than 76 years of age and your health care provider tells you that you are at risk for this type of infection.  Your sexual activity has changed since you were last screened and you are at an increased risk for chlamydia or gonorrhea. Ask your health care provider if you are at risk.  If you do not have HIV, but are at risk, it may be recommended that you take a prescription medicine daily to prevent HIV infection. This is called pre-exposure prophylaxis (PrEP). You are considered at risk if:  You are sexually active and do not regularly use condoms or know the HIV status of your partner(s).  You take drugs by injection.  You are sexually active with a partner who has HIV. Talk with your health care provider about whether you are at high risk of being infected with HIV. If you choose to begin PrEP, you should first be tested for HIV. You should then be tested every 3 months for as long as you are taking PrEP.  PREGNANCY   If you are premenopausal and you may become pregnant, ask your health care provider about preconception counseling.  If you may become pregnant, take 400 to 800 micrograms (mcg) of folic acid every day.  If you want to prevent pregnancy, talk to your health care provider about birth control (contraception). OSTEOPOROSIS AND MENOPAUSE   Osteoporosis is a disease in which the bones lose minerals and strength with aging. This can result in serious bone fractures. Your risk for osteoporosis can be identified using a bone density scan.  If you are 51 years of  age or older, or if you are at risk for osteoporosis and fractures, ask your health care provider if you should be screened.  Ask your health care provider whether you should take a calcium or vitamin D supplement to lower your risk for osteoporosis.  Menopause may have certain physical symptoms and risks.  Hormone replacement therapy may reduce some of these symptoms and risks. Talk to your health care provider about whether hormone replacement therapy is right for you.  HOME CARE INSTRUCTIONS   Schedule regular health, dental, and eye exams.  Stay current with your immunizations.   Do not use any tobacco products including cigarettes, chewing tobacco, or electronic cigarettes.  If you are pregnant, do not drink alcohol.  If you are breastfeeding, limit how much and how often you drink alcohol.  Limit alcohol intake to no more than 1 drink per day for nonpregnant women. One drink equals 12 ounces of beer, 5 ounces of wine, or 1 ounces of hard liquor.  Do not use street drugs.  Do not share needles.  Ask your health care provider for help if you need support or information about quitting drugs.  Tell your health care provider if you often feel depressed.  Tell your health care provider if you have ever been abused or do not feel safe at home. Document Released: 10/31/2010 Document Revised: 09/01/2013 Document Reviewed: 03/19/2013 [facility] Patient Information 2015 [place], [place]. This information is not intended to replace advice given to you by your health care provider. Make sure you discuss any questions you have with your health care provider. Preventive Care for Adults A healthy lifestyle and preventive care can promote health and wellness. Preventive health guidelines for women include the following key practices.  A routine yearly physical is a good way to check with your health care provider about  your health and preventive screening. It is a chance to share any concerns  and updates on your health and to receive a thorough exam.  Visit your dentist for a routine exam and preventive care every 6 months. Brush your teeth twice a day and floss once a day. Good oral hygiene prevents tooth decay and gum disease.  The frequency of eye exams is based on your age, health, family medical history, use of contact lenses, and other factors. Follow your health care provider's recommendations for frequency of eye exams.  Eat a healthy diet. Foods like vegetables, fruits, whole grains, low-fat dairy products, and lean protein foods contain the nutrients you need without too many calories. Decrease your intake of foods high in solid fats, added sugars, and salt. Eat the right amount of calories for you.Get information about a proper diet from your health care provider, if necessary.  Regular physical exercise is one of the most important things you can do for your health. Most adults should get at least 150 minutes of moderate-intensity exercise (any activity that increases your heart rate and causes you to sweat) each week. In addition, most adults need muscle-strengthening exercises on 2 or more days a week.  Maintain a healthy weight. The body mass index (BMI) is a screening tool to identify possible weight problems. It provides an estimate of body fat based on height and weight. Your health care provider can find your BMI and can help you achieve or maintain a healthy weight.For adults 20 years and older:  A BMI below 18.5 is considered underweight.  A BMI of 18.5 to 24.9 is normal.  A BMI of 25 to 29.9 is considered overweight.  A BMI of 30 and above is considered obese.  Maintain normal blood lipids and cholesterol levels by exercising and minimizing your intake of saturated fat. Eat a balanced diet with plenty of fruit and vegetables. Blood tests for lipids and cholesterol should begin at age 19 and be repeated every 5 years. If your lipid or cholesterol levels are  high, you are over 50, or you are at high risk for heart disease, you may need your cholesterol levels checked more frequently.Ongoing high lipid and cholesterol levels should be treated with medicines if diet and exercise are not working.  If you smoke, find out from your health care provider how to quit. If you do not use tobacco, do not start.  Lung cancer screening is recommended for adults aged 63-80 years who are at high risk for developing lung cancer because of a history of smoking. A yearly low-dose CT scan of the lungs is recommended for people who have at least a 30-pack-year history of smoking and are a current smoker or have quit within the past 15 years. A pack year of smoking is smoking an average of 1 pack of cigarettes a day for 1 year (for example: 1 pack a day for 30 years or 2 packs a day for 15 years). Yearly screening should continue until the smoker has stopped smoking for at least 15 years. Yearly screening should be stopped for people who develop a health problem that would prevent them from having lung cancer treatment.  If you are pregnant, do not drink alcohol. If you are breastfeeding, be very cautious about drinking alcohol. If you are not pregnant and choose to drink alcohol, do not have more than 1 drink per day. One drink is considered to be 12 ounces (355 mL) of beer, 5 ounces (148 mL)  of wine, or 1.5 ounces (44 mL) of liquor.  Avoid use of street drugs. Do not share needles with anyone. Ask for help if you need support or instructions about stopping the use of drugs.  High blood pressure causes heart disease and increases the risk of stroke. Your blood pressure should be checked at least every 1 to 2 years. Ongoing high blood pressure should be treated with medicines if weight loss and exercise do not work.  If you are 13-69 years old, ask your health care provider if you should take aspirin to prevent strokes.  Diabetes screening involves taking a blood sample to  check your fasting blood sugar level. This should be done once every 3 years, after age 77, if you are within normal weight and without risk factors for diabetes. Testing should be considered at a younger age or be carried out more frequently if you are overweight and have at least 1 risk factor for diabetes.  Breast cancer screening is essential preventive care for women. You should practice "breast self-awareness." This means understanding the normal appearance and feel of your breasts and may include breast self-examination. Any changes detected, no matter how small, should be reported to a health care provider. Women in their 98s and 30s should have a clinical breast exam (CBE) by a health care provider as part of a regular health exam every 1 to 3 years. After age 65, women should have a CBE every year. Starting at age 64, women should consider having a mammogram (breast X-ray test) every year. Women who have a family history of breast cancer should talk to their health care provider about genetic screening. Women at a high risk of breast cancer should talk to their health care providers about having an MRI and a mammogram every year.  Breast cancer gene (BRCA)-related cancer risk assessment is recommended for women who have family members with BRCA-related cancers. BRCA-related cancers include breast, ovarian, tubal, and peritoneal cancers. Having family members with these cancers may be associated with an increased risk for harmful changes (mutations) in the breast cancer genes BRCA1 and BRCA2. Results of the assessment will determine the need for genetic counseling and BRCA1 and BRCA2 testing.  Routine pelvic exams to screen for cancer are no longer recommended for nonpregnant women who are considered low risk for cancer of the pelvic organs (ovaries, uterus, and vagina) and who do not have symptoms. Ask your health care provider if a screening pelvic exam is right for you.  If you have had past  treatment for cervical cancer or a condition that could lead to cancer, you need Pap tests and screening for cancer for at least 20 years after your treatment. If Pap tests have been discontinued, your risk factors (such as having a new sexual partner) need to be reassessed to determine if screening should be resumed. Some women have medical problems that increase the chance of getting cervical cancer. In these cases, your health care provider may recommend more frequent screening and Pap tests.  The HPV test is an additional test that may be used for cervical cancer screening. The HPV test looks for the virus that can cause the cell changes on the cervix. The cells collected during the Pap test can be tested for HPV. The HPV test could be used to screen women aged 7 years and older, and should be used in women of any age who have unclear Pap test results. After the age of 32, women should have HPV  testing at the same frequency as a Pap test.  Colorectal cancer can be detected and often prevented. Most routine colorectal cancer screening begins at the age of 70 years and continues through age 72 years. However, your health care provider may recommend screening at an earlier age if you have risk factors for colon cancer. On a yearly basis, your health care provider may provide home test kits to check for hidden blood in the stool. Use of a small camera at the end of a tube, to directly examine the colon (sigmoidoscopy or colonoscopy), can detect the earliest forms of colorectal cancer. Talk to your health care provider about this at age 23, when routine screening begins. Direct exam of the colon should be repeated every 5-10 years through age 48 years, unless early forms of pre-cancerous polyps or small growths are found.  People who are at an increased risk for hepatitis B should be screened for this virus. You are considered at high risk for hepatitis B if:  You were born in a country where hepatitis B  occurs often. Talk with your health care provider about which countries are considered high risk.  Your parents were born in a high-risk country and you have not received a shot to protect against hepatitis B (hepatitis B vaccine).  You have HIV or AIDS.  You use needles to inject street drugs.  You live with, or have sex with, someone who has hepatitis B.  You get hemodialysis treatment.  You take certain medicines for conditions like cancer, organ transplantation, and autoimmune conditions.  Hepatitis C blood testing is recommended for all people born from [birth year] through 1967 and any individual with known risks for hepatitis C.  Practice safe sex. Use condoms and avoid high-risk sexual practices to reduce the spread of sexually transmitted infections (STIs). STIs include gonorrhea, chlamydia, syphilis, trichomonas, herpes, HPV, and human immunodeficiency virus (HIV). Herpes, HIV, and HPV are viral illnesses that have no cure. They can result in disability, cancer, and death.  You should be screened for sexually transmitted illnesses (STIs) including gonorrhea and chlamydia if:  You are sexually active and are younger than 24 years.  You are older than 24 years and your health care provider tells you that you are at risk for this type of infection.  Your sexual activity has changed since you were last screened and you are at an increased risk for chlamydia or gonorrhea. Ask your health care provider if you are at risk.  If you are at risk of being infected with HIV, it is recommended that you take a prescription medicine daily to prevent HIV infection. This is called preexposure prophylaxis (PrEP). You are considered at risk if:  You are a heterosexual woman, are sexually active, and are at increased risk for HIV infection.  You take drugs by injection.  You are sexually active with a partner who has HIV.  Talk with your health care provider about whether you are at high risk of  being infected with HIV. If you choose to begin PrEP, you should first be tested for HIV. You should then be tested every 3 months for as long as you are taking PrEP.  Osteoporosis is a disease in which the bones lose minerals and strength with aging. This can result in serious bone fractures or breaks. The risk of osteoporosis can be identified using a bone density scan. Women ages 84 years and over and women at risk for fractures or osteoporosis should discuss screening with  their health care providers. Ask your health care provider whether you should take a calcium supplement or vitamin D to reduce the rate of osteoporosis.  Menopause can be associated with physical symptoms and risks. Hormone replacement therapy is available to decrease symptoms and risks. You should talk to your health care provider about whether hormone replacement therapy is right for you.  Use sunscreen. Apply sunscreen liberally and repeatedly throughout the day. You should seek shade when your shadow is shorter than you. Protect yourself by wearing long sleeves, pants, a wide-brimmed hat, and sunglasses year round, whenever you are outdoors.  Once a month, do a whole body skin exam, using a mirror to look at the skin on your back. Tell your health care provider of new moles, moles that have irregular borders, moles that are larger than a pencil eraser, or moles that have changed in shape or color.  Stay current with required vaccines (immunizations).  Influenza vaccine. All adults should be immunized every year.  Tetanus, diphtheria, and acellular pertussis (Td, Tdap) vaccine. Pregnant women should receive 1 dose of Tdap vaccine during each pregnancy. The dose should be obtained regardless of the length of time since the last dose. Immunization is preferred during the 27th-36th week of gestation. An adult who has not previously received Tdap or who does not know her vaccine status should receive 1 dose of Tdap. This initial  dose should be followed by tetanus and diphtheria toxoids (Td) booster doses every 10 years. Adults with an unknown or incomplete history of completing a 3-dose immunization series with Td-containing vaccines should begin or complete a primary immunization series including a Tdap dose. Adults should receive a Td booster every 10 years.  Varicella vaccine. An adult without evidence of immunity to varicella should receive 2 doses or a second dose if she has previously received 1 dose. Pregnant females who do not have evidence of immunity should receive the first dose after pregnancy. This first dose should be obtained before leaving the health care facility. The second dose should be obtained 4-8 weeks after the first dose.  Human papillomavirus (HPV) vaccine. Females aged 13-26 years who have not received the vaccine previously should obtain the 3-dose series. The vaccine is not recommended for use in pregnant females. However, pregnancy testing is not needed before receiving a dose. If a female is found to be pregnant after receiving a dose, no treatment is needed. In that case, the remaining doses should be delayed until after the pregnancy. Immunization is recommended for any person with an immunocompromised condition through the age of 32 years if she did not get any or all doses earlier. During the 3-dose series, the second dose should be obtained 4-8 weeks after the first dose. The third dose should be obtained 24 weeks after the first dose and 16 weeks after the second dose.  Zoster vaccine. One dose is recommended for adults aged 40 years or older unless certain conditions are present.  Measles, mumps, and rubella (MMR) vaccine. Adults born before [birth year] generally are considered immune to measles and mumps. Adults born in [birth year] or later should have 1 or more doses of MMR vaccine unless there is a contraindication to the vaccine or there is laboratory evidence of immunity to each of the three diseases. A  routine second dose of MMR vaccine should be obtained at least 28 days after the first dose for students attending postsecondary schools, health care workers, or international travelers. People who received inactivated measles vaccine or  an unknown type of measles vaccine during 1963-1967 should receive 2 doses of MMR vaccine. People who received inactivated mumps vaccine or an unknown type of mumps vaccine before 1979 and are at high risk for mumps infection should consider immunization with 2 doses of MMR vaccine. For females of childbearing age, rubella immunity should be determined. If there is no evidence of immunity, females who are not pregnant should be vaccinated. If there is no evidence of immunity, females who are pregnant should delay immunization until after pregnancy. Unvaccinated health care workers born before [birth year] who lack laboratory evidence of measles, mumps, or rubella immunity or laboratory confirmation of disease should consider measles and mumps immunization with 2 doses of MMR vaccine or rubella immunization with 1 dose of MMR vaccine.  Pneumococcal 13-valent conjugate (PCV13) vaccine. When indicated, a person who is uncertain of her immunization history and has no record of immunization should receive the PCV13 vaccine. An adult aged 36 years or older who has certain medical conditions and has not been previously immunized should receive 1 dose of PCV13 vaccine. This PCV13 should be followed with a dose of pneumococcal polysaccharide (PPSV23) vaccine. The PPSV23 vaccine dose should be obtained at least 8 weeks after the dose of PCV13 vaccine. An adult aged 12 years or older who has certain medical conditions and previously received 1 or more doses of PPSV23 vaccine should receive 1 dose of PCV13. The PCV13 vaccine dose should be obtained 1 or more years after the last PPSV23 vaccine dose.  Pneumococcal polysaccharide (PPSV23) vaccine. When PCV13 is also indicated, PCV13 should be  obtained first. All adults aged 4 years and older should be immunized. An adult younger than age 66 years who has certain medical conditions should be immunized. Any person who resides in a nursing home or long-term care facility should be immunized. An adult smoker should be immunized. People with an immunocompromised condition and certain other conditions should receive both PCV13 and PPSV23 vaccines. People with human immunodeficiency virus (HIV) infection should be immunized as soon as possible after diagnosis. Immunization during chemotherapy or radiation therapy should be avoided. Routine use of PPSV23 vaccine is not recommended for American Indians, [place] Natives, or people younger than 65 years unless there are medical conditions that require PPSV23 vaccine. When indicated, people who have unknown immunization and have no record of immunization should receive PPSV23 vaccine. One-time revaccination 5 years after the first dose of PPSV23 is recommended for people aged 19-64 years who have chronic kidney failure, nephrotic syndrome, asplenia, or immunocompromised conditions. People who received 1-2 doses of PPSV23 before age 53 years should receive another dose of PPSV23 vaccine at age 31 years or later if at least 5 years have passed since the previous dose. Doses of PPSV23 are not needed for people immunized with PPSV23 at or after age 42 years.  Meningococcal vaccine. Adults with asplenia or persistent complement component deficiencies should receive 2 doses of quadrivalent meningococcal conjugate (MenACWY-D) vaccine. The doses should be obtained at least 2 months apart. Microbiologists working with certain meningococcal bacteria, [place] recruits, people at risk during an outbreak, and people who travel to or live in countries with a high rate of meningitis should be immunized. A first-year college student up through age 57 years who is living in a residence hall should receive a dose if she did not  receive a dose on or after her 16th birthday. Adults who have certain high-risk conditions should receive one or more doses of vaccine.  Hepatitis A vaccine. Adults who wish to be protected from this disease, have certain high-risk conditions, work with hepatitis A-infected animals, work in hepatitis A research labs, or travel to or work in countries with a high rate of hepatitis A should be immunized. Adults who were previously unvaccinated and who anticipate close contact with an international adoptee during the first 60 days after arrival in the [place] from a country with a high rate of hepatitis A should be immunized.  Hepatitis B vaccine. Adults who wish to be protected from this disease, have certain high-risk conditions, may be exposed to blood or other infectious body fluids, are household contacts or sex partners of hepatitis B positive people, are clients or workers in certain care facilities, or travel to or work in countries with a high rate of hepatitis B should be immunized.  Haemophilus influenzae type b (Hib) vaccine. A previously unvaccinated person with asplenia or sickle cell disease or having a scheduled splenectomy should receive 1 dose of Hib vaccine. Regardless of previous immunization, a recipient of a hematopoietic stem cell transplant should receive a 3-dose series 6-12 months after her successful transplant. Hib vaccine is not recommended for adults with HIV infection. Preventive Services / Frequency Ages 55 to 61 years  Blood pressure check.** / Every 1 to 2 years.  Lipid and cholesterol check.** / Every 5 years beginning at age 70.  Clinical breast exam.** / Every 3 years for women in their 47s and 26s.  BRCA-related cancer risk assessment.** / For women who have family members with a BRCA-related cancer (breast, ovarian, tubal, or peritoneal cancers).  Pap test.** / Every 2 years from ages 71 through 66. Every 3 years starting at age 53 through age 50 or 58 with  a history of 3 consecutive normal Pap tests.  HPV screening.** / Every 3 years from ages 28 through ages 71 to 14 with a history of 3 consecutive normal Pap tests.  Hepatitis C blood test.** / For any individual with known risks for hepatitis C.  Skin self-exam. / Monthly.  Influenza vaccine. / Every year.  Tetanus, diphtheria, and acellular pertussis (Tdap, Td) vaccine.** / Consult your health care provider. Pregnant women should receive 1 dose of Tdap vaccine during each pregnancy. 1 dose of Td every 10 years.  Varicella vaccine.** / Consult your health care provider. Pregnant females who do not have evidence of immunity should receive the first dose after pregnancy.  HPV vaccine. / 3 doses over 6 months, if 27 and younger. The vaccine is not recommended for use in pregnant females. However, pregnancy testing is not needed before receiving a dose.  Measles, mumps, rubella (MMR) vaccine.** / You need at least 1 dose of MMR if you were born in 1957 or later. You may also need a 2nd dose. For females of childbearing age, rubella immunity should be determined. If there is no evidence of immunity, females who are not pregnant should be vaccinated. If there is no evidence of immunity, females who are pregnant should delay immunization until after pregnancy.  Pneumococcal 13-valent conjugate (PCV13) vaccine.** / Consult your health care provider.  Pneumococcal polysaccharide (PPSV23) vaccine.** / 1 to 2 doses if you smoke cigarettes or if you have certain conditions.  Meningococcal vaccine.** / 1 dose if you are age 101 to 62 years and a Market researcher living in a residence hall, or have one of several medical conditions, you need to get vaccinated against meningococcal disease. You may also need additional  booster doses.  Hepatitis A vaccine.** / Consult your health care provider.  Hepatitis B vaccine.** / Consult your health care provider.  Haemophilus influenzae type b (Hib)  vaccine.** / Consult your health care provider. Ages 25 to 85 years  Blood pressure check.** / Every 1 to 2 years.  Lipid and cholesterol check.** / Every 5 years beginning at age 30 years.  Lung cancer screening. / Every year if you are aged 31-80 years and have a 30-pack-year history of smoking and currently smoke or have quit within the past 15 years. Yearly screening is stopped once you have quit smoking for at least 15 years or develop a health problem that would prevent you from having lung cancer treatment.  Clinical breast exam.** / Every year after age 13 years.  BRCA-related cancer risk assessment.** / For women who have family members with a BRCA-related cancer (breast, ovarian, tubal, or peritoneal cancers).  Mammogram.** / Every year beginning at age 73 years and continuing for as long as you are in good health. Consult with your health care provider.  Pap test.** / Every 3 years starting at age 58 years through age 62 or 72 years with a history of 3 consecutive normal Pap tests.  HPV screening.** / Every 3 years from ages 73 years through ages 61 to 44 years with a history of 3 consecutive normal Pap tests.  Fecal occult blood test (FOBT) of stool. / Every year beginning at age 33 years and continuing until age 20 years. You may not need to do this test if you get a colonoscopy every 10 years.  Flexible sigmoidoscopy or colonoscopy.** / Every 5 years for a flexible sigmoidoscopy or every 10 years for a colonoscopy beginning at age 74 years and continuing until age 85 years.  Hepatitis C blood test.** / For all people born from [birth year] through 1967 and any individual with known risks for hepatitis C.  Skin self-exam. / Monthly.  Influenza vaccine. / Every year.  Tetanus, diphtheria, and acellular pertussis (Tdap/Td) vaccine.** / Consult your health care provider. Pregnant women should receive 1 dose of Tdap vaccine during each pregnancy. 1 dose of Td every 10  years.  Varicella vaccine.** / Consult your health care provider. Pregnant females who do not have evidence of immunity should receive the first dose after pregnancy.  Zoster vaccine.** / 1 dose for adults aged 55 years or older.  Measles, mumps, rubella (MMR) vaccine.** / You need at least 1 dose of MMR if you were born in 1957 or later. You may also need a 2nd dose. For females of childbearing age, rubella immunity should be determined. If there is no evidence of immunity, females who are not pregnant should be vaccinated. If there is no evidence of immunity, females who are pregnant should delay immunization until after pregnancy.  Pneumococcal 13-valent conjugate (PCV13) vaccine.** / Consult your health care provider.  Pneumococcal polysaccharide (PPSV23) vaccine.** / 1 to 2 doses if you smoke cigarettes or if you have certain conditions.  Meningococcal vaccine.** / Consult your health care provider.  Hepatitis A vaccine.** / Consult your health care provider.  Hepatitis B vaccine.** / Consult your health care provider.  Haemophilus influenzae type b (Hib) vaccine.** / Consult your health care provider. Ages 68 years and over  Blood pressure check.** / Every 1 to 2 years.  Lipid and cholesterol check.** / Every 5 years beginning at age 2 years.  Lung cancer screening. / Every year if you are aged 41-80 years  and have a 30-pack-year history of smoking and currently smoke or have quit within the past 15 years. Yearly screening is stopped once you have quit smoking for at least 15 years or develop a health problem that would prevent you from having lung cancer treatment.  Clinical breast exam.** / Every year after age 59 years.  BRCA-related cancer risk assessment.** / For women who have family members with a BRCA-related cancer (breast, ovarian, tubal, or peritoneal cancers).  Mammogram.** / Every year beginning at age 68 years and continuing for as long as you are in good health.  Consult with your health care provider.  Pap test.** / Every 3 years starting at age 33 years through age 75 or 63 years with 3 consecutive normal Pap tests. Testing can be stopped between 65 and 70 years with 3 consecutive normal Pap tests and no abnormal Pap or HPV tests in the past 10 years.  HPV screening.** / Every 3 years from ages 27 years through ages 40 or 19 years with a history of 3 consecutive normal Pap tests. Testing can be stopped between 65 and 70 years with 3 consecutive normal Pap tests and no abnormal Pap or HPV tests in the past 10 years.  Fecal occult blood test (FOBT) of stool. / Every year beginning at age 21 years and continuing until age 44 years. You may not need to do this test if you get a colonoscopy every 10 years.  Flexible sigmoidoscopy or colonoscopy.** / Every 5 years for a flexible sigmoidoscopy or every 10 years for a colonoscopy beginning at age 69 years and continuing until age 28 years.  Hepatitis C blood test.** / For all people born from [birth year] through 1967 and any individual with known risks for hepatitis C.  Osteoporosis screening.** / A one-time screening for women ages 10 years and over and women at risk for fractures or osteoporosis.  Skin self-exam. / Monthly.  Influenza vaccine. / Every year.  Tetanus, diphtheria, and acellular pertussis (Tdap/Td) vaccine.** / 1 dose of Td every 10 years.  Varicella vaccine.** / Consult your health care provider.  Zoster vaccine.** / 1 dose for adults aged 26 years or older.  Pneumococcal 13-valent conjugate (PCV13) vaccine.** / Consult your health care provider.  Pneumococcal polysaccharide (PPSV23) vaccine.** / 1 dose for all adults aged 62 years and older.  Meningococcal vaccine.** / Consult your health care provider.  Hepatitis A vaccine.** / Consult your health care provider.  Hepatitis B vaccine.** / Consult your health care provider.  Haemophilus influenzae type b (Hib) vaccine.** / Consult your  health care provider. ** Family history and personal history of risk and conditions may change your health care provider's recommendations. Document Released: 06/13/2001 Document Revised: 09/01/2013 Document Reviewed: 09/12/2010 [facility] Patient Information 2015 [place], [place]. This information is not intended to replace advice given to you by your health care provider. Make sure you discuss any questions you have with your health care provider.

## 2014-11-26 NOTE — Assessment & Plan Note (Signed)
Labs Imodium prn Stool tests Align

## 2014-11-26 NOTE — Assessment & Plan Note (Signed)
On B12 

## 2014-11-26 NOTE — Progress Notes (Signed)
Subjective:   [name] is a 74 y.o. female who presents for Medicare Annual (Subsequent) preventive examination.  Review of Systems:  HRA assessment completed during visit;  Patient is here for Annual Wellness Assessment Personalized education given regarding risk on the problem list that are currently being medically managed;  Lifestyle changes reviewed or risk assessment for HTN; RH arthritis; renal issues; Hyperlipidemia Asplenia and CABG 96;  BMI; 27.1   Diet: Has one meal with monthly payment;  Exercise: exercise in the pool 3 days a week;  Did hurt her back recently; Is in PT right now; [place]oved in Dec 2015; Fully handicapped accessible with podiatrist and dental services Get meals every day   Screenings overdue:  Ophthalmology exam; 10/18/2014 ; possible vitrectomy; Goes to retina specialist one time per year; Dr. Zadie Rhine It is stable so will post pone vitrectomy for now per the patient Immunizations up to date Rh watching kidney function Tetanus  up to date Shingles completed Colonoscopy; 2002; aged out Mammogram; 06/2014; ok Dexa: 06/2014 spine L1/L4; T 1.1; Left femur; -0.7  EKG March 2016 Hearing: 2000hz  in both ear Dental: has dental work; goes 2 times a year  Current Care Team reviewed and updated   Cardiac Risk Factors include: advanced age (>69men, >29 women);dyslipidemia;family history of premature cardiovascular disease;hypertension;microalbuminuria   No issues verbalized;      Objective:     Vitals: BP 98/60 mmHg  Pulse 100  Temp(Src) 97.9 F (36.6 C)  Ht 5' 3.5" (1.613 m)  Wt 153 lb 8 oz (69.627 kg)  BMI 26.76 kg/m2  SpO2 93%  Tobacco History  Smoking status  . Former Smoker  . Quit date: 12/04/1958  Smokeless tobacco  . Never Used     Counseling given: Yes   Past Medical History  Diagnosis Date  . Hypothyroidism (acquired)     hypo  . Cervical disc disease   . Polymyalgia rheumatica     2011 Dr. Charlestine Night  .  Anemia   . Hyperlipidemia   . S/P CABG x 1 1996    CABG--LIMA-LAD for ostial LAD (not felt to be PCI amenable). EF NORMAL then; LIMA now atretic  . Abdominal aortic aneurysm     REPAIRED IN 1993 BY [name]  AND HAS RECENTLY BEEN FOLLOWED BY [name]  . 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 Rheumatoid arthritis 2011    Dr.Truslow; fracture knees, hands and wrists -   . CAD in native artery 1996, 2002, 2005     Status post CABG x1 with LIMA-LAD for 90% stenosis in the ostial LAD reduced to 50% in 2002 and 30% in 2005.;  Atretic LIMA  . COPD (chronic obstructive pulmonary disease)    Past Surgical History  Procedure Laterality Date  . Splenectomy    . Cardiac catheterization    . Cervical spine surgery      plate 2008 Dr. Saintclair Halsted  . Carpal tunnel release    . Cataract extraction    . Hernia repair    . Bunionectomy  07/2011    right foot  . Coronary artery bypass graft  1996    INCLUDED AN INTERNAL MAMMARY ARTERY TO THE LAD. EF WAS NORMAL  . 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 Eye surgery  Family History  Problem Relation Age of Onset  . Hypertension    . Coronary artery disease    . Asthma Neg Hx   . Colon cancer Neg Hx   . Hypertension Mother   . Diabetes Mother   . Heart disease Mother   . Hyperlipidemia Mother   . Stroke Father   . Hyperlipidemia Sister   . Hypertension Sister   . Hypertension Daughter   . Cancer Paternal Uncle     Deceased from cancer not sure of site  . Hypertension Son   . Hyperlipidemia Son   . Hyperlipidemia Son   . Hypertension Son   . Hyperlipidemia Son   . Hypertension Son    History  Sexual Activity  . Sexual Activity: Not Currently    Outpatient Encounter Prescriptions as of 11/26/2014  Medication Sig  . amoxicillin-clavulanate (AUGMENTIN) 500-125  MG per tablet Take 1 tablet (500 mg total) by mouth 3 (three) times daily. prn  . atorvastatin (LIPITOR) 40 MG tablet Take 1 tablet (40 mg total) by mouth daily.  . clopidogrel (PLAVIX) 75 MG tablet Take 1 tablet (75 mg total) by mouth daily.  [name] leflunomide (ARAVA) 20 MG tablet Take 1 tablet (20 mg total) by mouth every other day.  . levothyroxine (LEVOXYL) 88 MCG tablet Take 1 tablet (88 mcg total) by mouth daily.  [name] losartan (COZAAR) 25 MG tablet Take 1 tablet (25 mg total) by mouth daily.  . metoprolol succinate (TOPROL-XL) 25 MG 24 hr tablet Take 1 tablet (25 mg total) by mouth daily.  . psyllium (METAMUCIL) 58.6 % packet Take 1 packet by mouth daily.  . cetirizine (ZYRTEC) 10 MG tablet Take 10 mg by mouth daily.  . [DISCONTINUED] gabapentin (NEURONTIN) 100 MG capsule One pill @ bedtime; dose may be increased by one pill each dose after 72 hours if only partially effective (Patient not taking: Reported on 11/26/2014)  . [DISCONTINUED] HYDROcodone-acetaminophen (NORCO) 10-325 MG per tablet Take 1 tablet by mouth every 6 (six) hours as needed. (Patient not taking: Reported on 11/26/2014)  . [DISCONTINUED] predniSONE (DELTASONE) 20 MG tablet continuous as needed. 3 tabs by mouth daily x 3 days, then 2 tabs by mouth daily x 2 days then 1 tab by mouth daily x 2 days   No facility-administered encounter medications on file as of 11/26/2014.    Activities of Daily Living In your present state of health, do you have any difficulty performing the following activities: 11/26/2014 12/09/2013  Hearing? N N  Vision? N N  Difficulty concentrating or making decisions? N N  Walking or climbing stairs? N N  Dressing or bathing? N N  Doing errands, shopping? N N  Preparing Food and eating ? N -  Using the Toilet? N -  In the past six months, have you accidently leaked urine? N -  Do you have problems with loss of bowel control? Y -  Managing your Medications? N -  Managing your Finances? N -  Housekeeping  or managing your Housekeeping? N -    Patient Care Team: Cassandria Anger, MD as PCP - General Hurley Cisco, MD as Attending Physician (Internal Medicine) Leonie Man, MD as Consulting Physician (Cardiology) Hurley Cisco, MD as Consulting Physician (Rheumatology)    Assessment:   Objective:   The goal of the wellness visit is to assist the patient how to close the gaps in care and create a preventative care plan for the patient.  Personalized Education was given regarding: exercise  and Lipids   Pt determined a personalized goal; see patient goals;  Assessment included:  Bone density scan as appropriate/ completed / not osteopenic Calcium and Vit D as appropriate/ Osteoporosis risk reviewed [facility] and taking Calcium and cocoa ever day  Taking meds without issues; no barriers identified  Labs were and fup visit noted with MD if labs are due to be re-drawn.  Stress: Recommendations for managing stress if assessed as a factor;   No Risk for hepatitis or high risk social behavior identified via hepatitis screen  Educated on shingles and follow up with insurance company for co-pays or charges applied to Part D benefit. Educated on Vaccines;  Safety issues reviewed;  Cognition assessed by AD8; Score 0 MMSE deferred as the patient stated they had no memory issues; No identified risk were noted; The patient was oriented x 3; appropriate in dress and manner and no objective failures at ADL's or IADL's.   Depression screen negative;   Functional status reviewed; no losses in function x 1 year  Bowel and bladder issues assessed  End of life planning was discussed; aging in home or other; plans to complete HCPOA were discussed if not completed     Exercise Activities and Dietary recommendations Current Exercise Habits:: Structured exercise class, Type of exercise: walking, Time (Minutes): 45, Frequency (Times/Week): 3, Weekly Exercise (Minutes/Week):  135, Intensity: Moderate  Goals    . exercise     Would like to exercise more than she does; Moderate pool exercise 3 times a week Would like to start walking/ Coached on ways to stay motivated to walk; Takes stairs 3 flights down and down to basement; Will work on going up      [place]  11/26/2014 12/09/2013 01/25/2013  Falls in the past year? No No No  Risk for fall due to : - Impaired vision -  Risk for fall due to (comments): - macular degeneration -   Depression Screen PHQ 2/9 Scores 11/26/2014 12/09/2013  PHQ - 2 Score 0 0     Cognitive Testing MMSE - Mini Mental State Exam 11/26/2014  Not completed: Unable to complete    Immunization History  Administered Date(s) Administered  . Influenza Whole 05/02/2001, 01/27/2008, 01/12/2010, 12/31/2010, 01/25/2012  . Influenza, High Dose Seasonal PF 01/14/2014  . Influenza-Unspecified 02/03/2013, 01/13/2014  . Meningococcal Polysaccharide 03/21/2012  . PPD Test 11/18/2013  . Pneumococcal Conjugate-13 03/20/2013  . Pneumococcal Polysaccharide-23 05/02/2000, 06/23/2010  . Td 05/01/2006  . Zoster 01/17/2006   Screening Tests Health Maintenance  Topic Date Due  . INFLUENZA VACCINE  11/30/2014  . COLONOSCOPY  12/27/2015  . TETANUS/TDAP  05/01/2016  . DEXA SCAN  Completed  . ZOSTAVAX  Completed  . PNA vac Low Risk Adult  Completed      Plan:    Plan  Review of screenings completed and plan for completion individualized: Education and counseling performed based on assessed risks today. Patient provided with a copy of personalized plan for preventive services.    To discuss episodes of diarrhea with Dr. Alain Marion Had colonoscopy 2012 by Dr, Deatra Ina; precancerous polyp removed and to return in 5 years; August 2017.  May have another sooner   Wants to discuss right leg pain; generalized   During the course of the visit the patient was educated and counseled about the following appropriate screening and preventive  services:   Vaccines to include Pneumoccal, Influenza, Hepatitis B, Td, Zostavax, HCV/ up to day   Electrocardiogram  Cardiovascular Disease no issues  Colorectal cancer screening 2012  Bone density screening/   Diabetes screening /n/a  Glaucoma screening; has regular vision checks  Mammography/PAP/ mammogram this year  Nutrition counseling   Patient Instructions (the written plan) was given to the patient.   Wynetta Fines, RN  11/26/2014

## 2014-11-26 NOTE — Progress Notes (Signed)
Subjective:  Patient ID: [name], female    DOB: 11/22/1940  Age: 74 y.o. MRN: [id number]  CC: Medicare Wellness   HPI  JONIECE BOKELMAN presents for a well exam. C/o diarrhea 2 times per week - off and on x years.    Outpatient Prescriptions Prior to Visit  Medication Sig Dispense Refill  . atorvastatin (LIPITOR) 40 MG tablet Take 1 tablet (40 mg total) by mouth daily. 90 tablet 3  . clopidogrel (PLAVIX) 75 MG tablet Take 1 tablet (75 mg total) by mouth daily. 90 tablet 3  . leflunomide (ARAVA) 20 MG tablet Take 1 tablet (20 mg total) by mouth every other day. 15 tablet 6  . levothyroxine (LEVOXYL) 88 MCG tablet Take 1 tablet (88 mcg total) by mouth daily. 90 tablet 3  . losartan (COZAAR) 25 MG tablet Take 1 tablet (25 mg total) by mouth daily. 90 tablet 3  . metoprolol succinate (TOPROL-XL) 25 MG 24 hr tablet Take 1 tablet (25 mg total) by mouth daily. 90 tablet 3  . psyllium (METAMUCIL) 58.6 % packet Take 1 packet by mouth daily.    [name] amoxicillin-clavulanate (AUGMENTIN) 500-125 MG per tablet Take 1 tablet (500 mg total) by mouth 3 (three) times daily. prn (Patient not taking: Reported on 11/26/2014) 30 tablet 2  . cetirizine (ZYRTEC) 10 MG tablet Take 10 mg by mouth daily.    [name] gabapentin (NEURONTIN) 100 MG capsule One pill @ bedtime; dose may be increased by one pill each dose after 72 hours if only partially effective (Patient not taking: Reported on 11/26/2014) 30 capsule 0  . HYDROcodone-acetaminophen (NORCO) 10-325 MG per tablet Take 1 tablet by mouth every 6 (six) hours as needed. (Patient not taking: Reported on 11/26/2014) 30 tablet 0  . predniSONE (DELTASONE) 20 MG tablet continuous as needed. 3 tabs by mouth daily x 3 days, then 2 tabs by mouth daily x 2 days then 1 tab by mouth daily x 2 days     No facility-administered medications prior to visit.    ROS Review of Systems  Constitutional: Negative for chills, activity change, appetite change, fatigue and unexpected  weight change.  HENT: Negative for congestion, mouth sores and sinus pressure.   Eyes: Negative for visual disturbance.  Respiratory: Negative for cough and chest tightness.   Gastrointestinal: Negative for nausea and abdominal pain.  Genitourinary: Negative for frequency, difficulty urinating and vaginal pain.  Musculoskeletal: Positive for arthralgias. Negative for back pain and gait problem.  Skin: Negative for pallor and rash.  Neurological: Negative for dizziness, tremors, weakness, numbness and headaches.  Psychiatric/Behavioral: Negative for confusion and sleep disturbance.    Objective:  BP 138/80 mmHg  Pulse 100  Temp(Src) 97.9 F (36.6 C)  Ht 5' 3.5" (1.613 m)  Wt 153 lb 8 oz (69.627 kg)  BMI 26.76 kg/m2  SpO2 93%  BP Readings from Last 3 Encounters:  11/26/14 138/80  09/29/14 132/86  07/02/14 138/80    Wt Readings from Last 3 Encounters:  11/26/14 153 lb 8 oz (69.627 kg)  07/02/14 151 lb (68.493 kg)  05/04/14 152 lb (68.947 kg)    Physical Exam  Constitutional: She appears well-developed. No distress.  Obese  HENT:  Head: Normocephalic.  Right Ear: External ear normal.  Left Ear: External ear normal.  Nose: Nose normal.  Mouth/Throat: Oropharynx is clear and moist.  Eyes: Conjunctivae are normal. Pupils are equal, round, and reactive to light. Right eye exhibits no discharge. Left eye exhibits no  discharge.  Neck: Normal range of motion. Neck supple. No JVD present. No tracheal deviation present. No thyromegaly present.  Cardiovascular: Normal rate, regular rhythm and normal heart sounds.   Pulmonary/Chest: No stridor. No respiratory distress. She has no wheezes.  Abdominal: Soft. Bowel sounds are normal. She exhibits no distension and no mass. There is no tenderness. There is no rebound and no guarding.  Musculoskeletal: She exhibits no edema or tenderness.  Lymphadenopathy:    She has no cervical adenopathy.  Neurological: She displays normal reflexes.  No cranial nerve deficit. She exhibits normal muscle tone. Coordination normal.  Skin: No rash noted. No erythema.  Psychiatric: She has a normal mood and affect. Her behavior is normal. Judgment and thought content normal.    Lab Results  Component Value Date   WBC 7.1 10/24/2013   HGB 13.0 10/24/2013   HCT 40.3 10/24/2013   PLT 347.0 10/24/2013   GLUCOSE 111* 10/24/2013   CHOL 183 10/24/2013   TRIG 93.0 10/24/2013   HDL 82.10 10/24/2013   LDLDIRECT 162.1 10/28/2009   LDLCALC 82 10/24/2013   ALT 28 10/24/2013   AST 30 10/24/2013   NA 142 10/24/2013   K 4.6 10/24/2013   CL 105 10/24/2013   CREATININE 1.51* 04/06/2014   BUN 35* 04/06/2014   CO2 29 10/24/2013   TSH 1.83 10/24/2013   INR 1.0 11/29/2006    Dg Lumbar Spine Complete  09/30/2014   CLINICAL DATA:  Lumbar pain 7 days.  No injury.  EXAM: LUMBAR SPINE - COMPLETE 4+ VIEW  COMPARISON:  [facility] 09/04/2012 and CT abdomen/pelvis 01/22/2012  FINDINGS: There is mild curvature of the lumbar spine convex right unchanged. There is mild to moderate spondylosis throughout the lumbar spine. There is disc space narrowing at the L2-3 and L3-4 levels. Facet arthropathy is present over the mid to lower lumbar spine. There is no compression fracture or subluxation. There is calcified atherosclerotic plaque over the distal abdominal aorta and iliac arteries.  IMPRESSION: Mild to moderate spondylosis of the lumbar spine to include facet arthropathy. Disc disease at the L2-3 and L3-4 levels.   Electronically Signed   By: Marin Olp M.D.   On: 09/30/2014 13:41    Assessment & Plan:   Dusti was seen today for medicare wellness.  Diagnoses and all orders for this visit:  Routine history and physical examination of adult   I have discontinued Ms. Thielen's predniSONE, gabapentin, and HYDROcodone-acetaminophen. I am also having her maintain her cetirizine, psyllium, leflunomide, amoxicillin-clavulanate, atorvastatin, clopidogrel, levothyroxine,  losartan, and metoprolol succinate.  No orders of the defined types were placed in this encounter.     Follow-up: Return in 1 year (on 11/26/2015).  [name], MD

## 2014-11-26 NOTE — Assessment & Plan Note (Signed)
Losartan, Metoprolol, Lipitor, Plavix 

## 2014-11-30 DIAGNOSIS — I219 Acute myocardial infarction, unspecified: Secondary | ICD-10-CM

## 2014-11-30 DIAGNOSIS — I639 Cerebral infarction, unspecified: Secondary | ICD-10-CM

## 2014-11-30 HISTORY — DX: Acute myocardial infarction, unspecified: I21.9

## 2014-11-30 HISTORY — DX: Cerebral infarction, unspecified: I63.9

## 2014-12-01 ENCOUNTER — Encounter: Payer: Self-pay | Admitting: Gastroenterology

## 2014-12-30 DIAGNOSIS — R4182 Altered mental status, unspecified: Secondary | ICD-10-CM | POA: Insufficient documentation

## 2014-12-31 DIAGNOSIS — G459 Transient cerebral ischemic attack, unspecified: Secondary | ICD-10-CM | POA: Insufficient documentation

## 2014-12-31 HISTORY — PX: TRANSTHORACIC ECHOCARDIOGRAM: SHX275

## 2015-01-04 ENCOUNTER — Encounter: Payer: Self-pay | Admitting: Internal Medicine

## 2015-01-29 ENCOUNTER — Ambulatory Visit (INDEPENDENT_AMBULATORY_CARE_PROVIDER_SITE_OTHER): Payer: Medicare Other

## 2015-01-29 ENCOUNTER — Ambulatory Visit (INDEPENDENT_AMBULATORY_CARE_PROVIDER_SITE_OTHER): Payer: Medicare Other | Admitting: Physician Assistant

## 2015-01-29 ENCOUNTER — Ambulatory Visit
Admission: RE | Admit: 2015-01-29 | Discharge: 2015-01-29 | Disposition: A | Discharge status: Home / Self Care | Payer: Medicare Other | Source: Ambulatory Visit | Attending: Physician Assistant | Admitting: Physician Assistant

## 2015-01-29 VITALS — BP 130/72 | HR 67 | Temp 98.1°F | Resp 16 | Ht 63.5 in | Wt 154.0 lb

## 2015-01-29 DIAGNOSIS — R05 Cough: Secondary | ICD-10-CM

## 2015-01-29 DIAGNOSIS — R911 Solitary pulmonary nodule: Secondary | ICD-10-CM | POA: Diagnosis not present

## 2015-01-29 DIAGNOSIS — R059 Cough, unspecified: Secondary | ICD-10-CM

## 2015-01-29 DIAGNOSIS — R5383 Other fatigue: Secondary | ICD-10-CM | POA: Diagnosis not present

## 2015-01-29 DIAGNOSIS — R5381 Other malaise: Secondary | ICD-10-CM | POA: Diagnosis not present

## 2015-01-29 LAB — POCT CBC
GRANULOCYTE PERCENT: 69 % (ref 37–80)
HCT, POC: 40.3 % (ref 37.7–47.9)
Hemoglobin: 12.9 g/dL (ref 12.2–16.2)
Lymph, poc: 1.8 (ref 0.6–3.4)
MCH: 29.1 pg (ref 27–31.2)
MCHC: 32.1 g/dL (ref 31.8–35.4)
MCV: 90.9 fL (ref 80–97)
MID (CBC): 0.3 (ref 0–0.9)
MPV: 9.5 fL (ref 0–99.8)
PLATELET COUNT, POC: 267 10*3/uL (ref 142–424)
POC GRANULOCYTE: 4.8 (ref 2–6.9)
POC LYMPH PERCENT: 26.2 %L (ref 10–50)
POC MID %: 4.8 %M (ref 0–12)
RBC: 4.43 M/uL (ref 4.04–5.48)
RDW, POC: 16 %
WBC: 7 10*3/uL (ref 4.6–10.2)

## 2015-01-29 MED ORDER — AMOXICILLIN-POT CLAVULANATE 875-125 MG PO TABS
1.0000 | ORAL_TABLET | Freq: Two times a day (BID) | ORAL | Status: DC
Start: 2015-01-29 — End: 2015-02-19

## 2015-01-29 MED ORDER — AMOXICILLIN-POT CLAVULANATE 875-125 MG PO TABS: 1.0000 | ORAL_TABLET | Freq: Two times a day (BID) | ORAL | Status: DC

## 2015-01-29 NOTE — Progress Notes (Signed)
01/29/2015 at 7:34 PM  [name] / DOB: Apr 27, 1941 / MRN: [id number]  The patient has Hypothyroidism; MACULAR DEGENERATION; Essential hypertension; HIATAL HERNIA; RENAL FAILURE, CHRONIC; ROSACEA; ARTHRALGIA; ASPLENIA; Rheumatoid arthritis; Long term (current) use of anticoagulants; Pernicious anemia; Mesenteric artery stenosis; 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); 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; now atretic and significantly improved LAD lesion without intervention; Atherosclerosis of coronary artery without angina pectoris; Hyperlipidemia with target LDL less than 70; Splenic artery aneurysm; Seasonal allergies; Diarrhea; and Well adult exam on her problem list.  SUBJECTIVE  [name] is a 74 y.o. frail appearing female presenting for the chief complaint of cough and corzya for 7 days. Associates malaise and fatigue.  She feels her symptoms are getting worse.  Has tried Tylenol without relief.  She is returning from a cruise and reports most of her friends got a cold during the trip.  Has a history of aspelnia and rupture abdominal aortic aneurysm in 1996. History of RA and treated with immunosuppressives.  Denies fever, weight loss, night sweats and neck pain.   She  has a past medical history of Hypothyroidism (acquired); Cervical disc disease; Polymyalgia rheumatica; Anemia; Hyperlipidemia; S/P CABG x 1 (1996); Abdominal aortic aneurysm; Acquired asplenia; Rheumatoid arthritis (2011); CAD in native artery (1996, 2002, 2005 ); and COPD (chronic obstructive pulmonary disease).    Medications reviewed and updated by myself where necessary, and exist elsewhere in the encounter.   Ms. Kanning is allergic to aspirin; codeine; and nitroglycerin. She  reports that she quit smoking about 56 years ago. She has never used smokeless tobacco. She reports that she drinks about 0.6 - 1.8 oz of alcohol per week. She reports that she does not use illicit  drugs. She  reports that she does not currently engage in sexual activity. The patient  has past surgical history that includes Splenectomy; Cardiac catheterization; Cervical spine surgery; Carpal tunnel release; Cataract extraction; Hernia repair; Bunionectomy (07/2011); Coronary artery bypass graft (1996); Abdominal aortic aneurysm repair (1996); and Eye surgery.  Her family history includes Cancer in her paternal uncle; Coronary artery disease in an other family member; Diabetes in her mother; Heart disease in her mother; Hyperlipidemia in her mother, sister, son, son, and son; Hypertension in her daughter, mother, sister, son, son, son, and another family member; Stroke in her father. There is no history of Asthma or Colon cancer.  Review of Systems  Constitutional: Negative for fever and chills.  Respiratory: Negative for shortness of breath.   Cardiovascular: Negative for chest pain.  Gastrointestinal: Negative for nausea and abdominal pain.  Genitourinary: Negative.   Skin: Negative for rash.  Neurological: Negative for dizziness and headaches.  Psychiatric/Behavioral: Negative for depression.    OBJECTIVE  Her  height is 5' 3.5" (1.613 m) and weight is 154 lb (69.854 kg). Her oral temperature is 98.1 F (36.7 C). Her blood pressure is 130/72 and her pulse is 67. Her respiration is 16 and oxygen saturation is 93%.  The patient's body mass index is 26.85 kg/(m^2).  Physical Exam  Constitutional: She is oriented to person, place, and time. She appears well-developed and well-nourished. No distress.  HENT:  Right Ear: Hearing, tympanic membrane, external ear and ear canal normal.  Left Ear: Hearing, tympanic membrane, external ear and ear canal normal.  Nose: Mucosal edema present. Right sinus exhibits no maxillary sinus tenderness and no frontal sinus tenderness. Left sinus exhibits no maxillary sinus tenderness and  no frontal sinus tenderness.  Mouth/Throat: Uvula is midline, oropharynx  is clear and moist and mucous membranes are normal.  Cardiovascular: Normal rate, regular rhythm and normal heart sounds.   Respiratory: Effort normal and breath sounds normal. She has no wheezes. She has no rales.  Neurological: She is alert and oriented to person, place, and time.  Skin: Skin is warm and dry. She is not diaphoretic.  Psychiatric: She has a normal mood and affect.    [facility] reading (PRIMARY) by  PA Clark: STAT read please comment.   EXAM: CHEST 2 VIEW  COMPARISON: None.  FINDINGS: Lungs are somewhat hyperexpanded. There is no edema or consolidation. On the lateral view, there is a nodular opacity measuring 1.5 x 1.4 cm at the level of the anterior upper thoracic spine. This opacity is not seen on the frontal view.  Patient is status post internal mammary bypass grafting. Heart size and pulmonary vascularity are within normal limits. No adenopathy. No bone lesions.  IMPRESSION: 1.5 x 1.4 cm nodular opacity at the level of the anterior upper thoracic spine seen on the lateral view. This finding warrants noncontrast enhanced chest CT to further assess.  Lungs are hyperexpanded. No edema or consolidation.  These results will be called to the ordering clinician or representative by the Radiologist Assistant, and communication documented in the PACS or zVision Dashboard.   Electronically Signed  By: Lowella Grip III M.D.  On: 01/29/2015 14:41  Results for orders placed or performed in visit on 01/29/15 (from the past 24 hour(s))  POCT CBC     Status: None   Collection Time: 01/29/15  3:24 PM  Result Value Ref Range   WBC 7.0 4.6 - 10.2 K/uL   Lymph, poc 1.8 0.6 - 3.4   POC LYMPH PERCENT 26.2 10 - 50 %L   MID (cbc) 0.3 0 - 0.9   POC MID % 4.8 0 - 12 %M   POC Granulocyte 4.8 2 - 6.9   Granulocyte percent 69.0 37 - 80 %G   RBC 4.43 4.04 - 5.48 M/uL   Hemoglobin 12.9 12.2 - 16.2 g/dL   HCT, POC 40.3 37.7 - 47.9 %   MCV 90.9 80 - 97 fL    MCH, POC 29.1 27 - 31.2 pg   MCHC 32.1 31.8 - 35.4 g/dL   RDW, POC 16.0 %   Platelet Count, POC 267 142 - 424 K/uL   MPV 9.5 0 - 99.8 fL    ASSESSMENT & PLAN  Preya was seen today for nasal congestion and cough.  Diagnoses and all orders for this visit:  Cough: No other findings on exam.  She is taking immunosuppressives. Will cover for CAP.  She has tolerated Augmenting well in the past.     -     POCT CBC -     DG Chest 2 View; Future  Malaise and fatigue: Managed with the above  Lung nodule seen on imaging study: Stat non contrast CT per radiologists reqs.       The patient was advised to call or come back to clinic if she does not see an improvement in symptoms, or worsens with the above plan.   Philis Fendt, MHS, PA-C [facility] 01/29/2015 7:34 PM

## 2015-01-30 ENCOUNTER — Other Ambulatory Visit: Payer: Self-pay | Admitting: Physician Assistant

## 2015-01-30 DIAGNOSIS — R918 Other nonspecific abnormal finding of lung field: Secondary | ICD-10-CM

## 2015-01-30 NOTE — Progress Notes (Signed)
I agree with a PET scan and pulmonary or cardiothoracic surgery referral. Thank you  [facility]

## 2015-01-30 NOTE — Progress Notes (Signed)
Spoke with Owens Loffler MD, a partner of Dr. Caren Hazy.  He is recommending a referral to pulmonology for further workup of the nodules.  Will stat refer today.  Will include Dr. Earlie Server in oncology and await his recommendation as well.  Philis Fendt, MS, PA-C   8:41 AM, 01/30/2015

## 2015-02-01 ENCOUNTER — Encounter: Payer: Self-pay | Admitting: Internal Medicine

## 2015-02-01 ENCOUNTER — Other Ambulatory Visit: Payer: Self-pay | Admitting: Physician Assistant

## 2015-02-01 ENCOUNTER — Other Ambulatory Visit: Payer: Self-pay | Admitting: *Deleted

## 2015-02-01 DIAGNOSIS — R918 Other nonspecific abnormal finding of lung field: Secondary | ICD-10-CM

## 2015-02-01 MED ORDER — ATORVASTATIN CALCIUM 80 MG PO TABS: 80.0000 mg | ORAL_TABLET | Freq: Every day | ORAL | Status: DC

## 2015-02-01 NOTE — Progress Notes (Signed)
Ordering PET base of skull to thigh per Dr. Worthy Flank reqs.  Philis Fendt, MS, PA-C   9:29 AM, 02/01/2015

## 2015-02-01 NOTE — Progress Notes (Signed)
Referrals notified and they will work on this asap

## 2015-02-10 ENCOUNTER — Ambulatory Visit (HOSPITAL_COMMUNITY)
Admission: RE | Admit: 2015-02-10 | Discharge: 2015-02-10 | Disposition: A | Discharge status: Home / Self Care | Payer: Medicare Other | Source: Ambulatory Visit | Attending: Physician Assistant | Admitting: Physician Assistant

## 2015-02-10 DIAGNOSIS — R918 Other nonspecific abnormal finding of lung field: Secondary | ICD-10-CM

## 2015-02-10 LAB — GLUCOSE, CAPILLARY: Glucose-Capillary: 80 mg/dL (ref 65–99)

## 2015-02-10 MED ORDER — FLUDEOXYGLUCOSE F - 18 (FDG) INJECTION
7.5900 | Freq: Once | INTRAVENOUS | Status: DC | PRN
  Administered 2015-02-10: 7.59 via INTRAVENOUS
  Filled 2015-02-10: qty 7.59

## 2015-02-15 ENCOUNTER — Encounter: Payer: Self-pay | Admitting: Internal Medicine

## 2015-02-15 ENCOUNTER — Other Ambulatory Visit: Payer: Medicare Other

## 2015-02-15 ENCOUNTER — Ambulatory Visit (INDEPENDENT_AMBULATORY_CARE_PROVIDER_SITE_OTHER): Payer: Medicare Other | Admitting: Internal Medicine

## 2015-02-15 VITALS — BP 122/62 | HR 63 | Ht 63.5 in | Wt 155.4 lb

## 2015-02-15 DIAGNOSIS — R0600 Dyspnea, unspecified: Secondary | ICD-10-CM

## 2015-02-15 DIAGNOSIS — R911 Solitary pulmonary nodule: Secondary | ICD-10-CM | POA: Diagnosis not present

## 2015-02-15 NOTE — Progress Notes (Signed)
   Subjective:    Patient ID: [name], female    DOB: 1941-04-22, 74 y.o.   MRN: [id number]  HPI    Review of Systems  Constitutional: Negative for fever, chills and unexpected weight change.  HENT: Negative for congestion, dental problem, ear pain, nosebleeds, postnasal drip, rhinorrhea, sinus pressure, sneezing, sore throat, trouble swallowing and voice change.   Eyes: Negative for visual disturbance.  Respiratory: Negative for cough, choking and shortness of breath.   Cardiovascular: Negative for chest pain and leg swelling.  Gastrointestinal: Negative for vomiting, abdominal pain and diarrhea.  Genitourinary: Negative for difficulty urinating.  Musculoskeletal: Negative for arthralgias.  Skin: Negative for rash.  Neurological: Negative for tremors, syncope and headaches.  Hematological: Does not bruise/bleed easily.       Objective:   Physical Exam        Assessment & Plan:

## 2015-02-15 NOTE — Patient Instructions (Signed)
Please remember to go to the lab  department downstairs for your tests - we will call you with the results when they are available.  Please see patient coordinator before you leave today  to schedule T surgery evaluation of your nodule

## 2015-02-15 NOTE — Progress Notes (Signed)
Subjective:    Patient ID: [name], female    DOB: 10-08-1940,    MRN: [id number]  HPI    74 yowf smoker age 74- 36 s obvious sequelae with new cough while on a cruise in September 2016 in [place] > cxr > ct chest > PET > referred to pulmonary clinic 02/15/2015 by Dr  Philis [facility]    02/15/2015 1st [facility] office visit/ Farren Nelles   Chief Complaint  Patient presents with  . PULMONARY CONSULT    Pulmonary nodules. Pt reports some dyspnea with exertion, some morning dry to wet cough. Pt does not use any inhalers.   Cough better but some cc am congestion x years ? Worse in winter > total ? Usually swallows so can't say color or amt assoc with mild pnds  Doe only with exertion = MMRC1 = can walk nl pace, flat grade, can't hurry or go uphills or steps s mild sob    No obvious day to day or daytime variability or assoc   cp or chest tightness, subjective wheeze or overt   hb symptoms. No unusual exp hx or h/o childhood pna/ asthma or knowledge of premature birth.  Sleeping ok without nocturnal    exacerbation  of respiratory  c/o's or need for noct saba. Also denies any obvious fluctuation of symptoms with weather or environmental changes or other aggravating or alleviating factors except as outlined above   Current Medications, Allergies, Complete Past Medical History, Past Surgical History, Family History, and Social History were reviewed in [employer] record.      Review of Systems  Constitutional: Negative.  Negative for fever and unexpected weight change.  HENT: Positive for congestion, postnasal drip and rhinorrhea. Negative for dental problem, ear pain, nosebleeds, sinus pressure, sneezing, sore throat and trouble swallowing.   Eyes: Negative.  Negative for redness and itching.  Respiratory: Positive for cough and shortness of breath. Negative for chest tightness and wheezing.   Cardiovascular: Negative.  Negative for palpitations and leg  swelling.  Gastrointestinal: Negative.  Negative for nausea and vomiting.  Endocrine: Negative.   Genitourinary: Negative.  Negative for dysuria.  Musculoskeletal: Positive for joint swelling.  Skin: Negative.  Negative for rash.  Allergic/Immunologic: Negative.   Neurological: Negative.  Negative for headaches.  Hematological: Bruises/bleeds easily.  Psychiatric/Behavioral: Negative.  Negative for dysphoric mood. The patient is not nervous/anxious.        Objective:   Physical Exam  amb wm   Wt Readings from Last 3 Encounters:  02/15/15 155 lb 6.4 oz (70.489 kg)  01/29/15 154 lb (69.854 kg)  11/26/14 153 lb 8 oz (69.627 kg)    Vital signs reviewed    HEENT: nl dentition, turbinates, and orophanx. Nl external ear canals without cough reflex   NECK :  without JVD/Nodes/TM/ nl carotid upstrokes bilaterally   LUNGS: no acc muscle use, clear to A and P bilaterally without cough on insp or exp maneuvers   CV:  RRR  no s3 or murmur or increase in P2, no edema   ABD:  soft and nontender with nl excursion in the supine position. No bruits or organomegaly, bowel sounds nl  MS:  warm without deformities, calf tenderness, cyanosis or clubbing  SKIN: warm and dry without lesions    NEURO:  alert, approp, no deficits     I personally reviewed images and agree with radiology impression as follows:  CXR:  01/29/15  1.5 x 1.4 cm nodular  opacity at the level of the anterior upper thoracic spine seen on the lateral view.   Lungs are hyperexpanded. No edema or consolidation.      Labs ordered 02/15/2015  : quantiferon GOLD TB screen         Assessment & Plan:

## 2015-02-16 DIAGNOSIS — R0609 Other forms of dyspnea: Secondary | ICD-10-CM

## 2015-02-16 NOTE — Assessment & Plan Note (Addendum)
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  Discussed in detail all the  indications, usual  risks and alternatives  relative to the benefits with patient who agrees to proceed with w/u as planned.  Total time devoted to counseling  = 17m review case with pt/ discussion of options/alternatives/ giving and going over instructions (see avs)

## 2015-02-16 NOTE — Assessment & Plan Note (Signed)
Spirometry 02/15/2015   FEV1  1.84 (92%) ratio 77 - 02/15/2015  Walked RA x 3 laps @ 185 ft each stopped due to  End of study, fast  pace, no sob or desat

## 2015-02-17 LAB — QUANTIFERON TB GOLD ASSAY (BLOOD)
INTERFERON GAMMA RELEASE ASSAY: NEGATIVE
MITOGEN VALUE: 5.26 [IU]/mL
QUANTIFERON NIL VALUE: 0.03 [IU]/mL
Quantiferon Tb Ag Minus Nil Value: -0.01 IU/mL
TB Ag value: 0.02 IU/mL

## 2015-02-18 ENCOUNTER — Encounter: Payer: Self-pay | Admitting: Cardiothoracic Surgery

## 2015-02-18 ENCOUNTER — Other Ambulatory Visit: Payer: Self-pay | Admitting: *Deleted

## 2015-02-18 ENCOUNTER — Institutional Professional Consult (permissible substitution) (INDEPENDENT_AMBULATORY_CARE_PROVIDER_SITE_OTHER): Payer: Medicare Other | Admitting: Cardiothoracic Surgery

## 2015-02-18 VITALS — BP 148/72 | HR 80 | Resp 20 | Ht 63.5 in | Wt 152.0 lb

## 2015-02-18 DIAGNOSIS — R911 Solitary pulmonary nodule: Secondary | ICD-10-CM

## 2015-02-18 NOTE — Patient Instructions (Signed)
Lung Cancer Lung cancer occurs when abnormal cells in the lung grow out of control and form a mass (tumor). There are several types of lung cancer. The two most common types are:  Non-small cell. In this type of lung cancer, abnormal cells are larger and grow more slowly than those of small cell lung cancer.  Small cell. In this type of lung cancer, abnormal cells are smaller than those of non-small cell lung cancer. Small cell lung cancer gets worse faster than non-small cell lung cancer. CAUSES  The leading cause of lung cancer is smoking tobacco. The second leading cause is radon exposure. RISK FACTORS  Smoking tobacco.  Exposure to secondhand tobacco smoke.  Exposure to radon gas.  Exposure to asbestos.  Exposure to arsenic in drinking water.  Air pollution.  Family or personal history of lung cancer.  Lung radiation therapy.  Being older than 79 years. SIGNS AND SYMPTOMS  In the early stages, symptoms may not be present. As the cancer progresses, symptoms may include:  A lasting cough, possibly with blood.  Fatigue.  Unexplained weight loss.  Shortness of breath.  Wheezing.  Chest pain.  Loss of appetite. Symptoms of advanced lung cancer include:  Hoarseness.  Bone or joint pain.  Weakness.  Nail problems.  Face or arm swelling.  Paralysis of the face.  Drooping eyelids. DIAGNOSIS  Lung cancer can be identified with a physical exam and with tests such as:  A chest X-ray.  A CT scan.  Blood tests.  A biopsy. After a diagnosis is made, you will have more tests to determine the stage of the cancer. The stages of non-small cell lung cancer are:  Stage 0, also called carcinoma in situ. At this stage, abnormal cells are found in the inner lining of your lung or lungs.  Stage I. At this stage, abnormal cells have grown into a tumor that is no larger than 5 cm across. The cancer has entered the deeper lung tissue but has not yet entered the lymph  nodes or other parts of the body.  Stage II. At this stage, the tumor is 7 cm across or smaller and has entered nearby lymph nodes. Or, the tumor is 5 cm across or smaller and has invaded surrounding tissue but is not found in nearby lymph nodes. There may be more than one tumor present.  Stage III. At this stage, the tumor may be any size. There may be more than one tumor in the lungs. The cancer cells have spread to the lymph nodes and possibly to other organs.  Stage IV. At this stage, there are tumors in both lungs and the cancer has spread to other areas of the body. The stages of small cell lung cancer are:  Limited. At this stage, the cancer is found only on one side of the chest.  Extensive. At this stage, the cancer is in the lungs and in tissues on the other side of the chest. The cancer has spread to other organs or is found in the fluid between the layers of your lungs. TREATMENT  Depending on the type and stage of your lung cancer, you may be treated with:  Surgery. This is done to remove a tumor.  Radiation therapy. This treatment destroys cancer cells using X-rays or other types of radiation.  Chemotherapy. This treatment uses medicines to destroy cancer cells.  Targeted therapy. This treatment aims to destroy only cancer cells instead of all cells as other therapies do. You may  also have a combination of treatments. HOME CARE INSTRUCTIONS   Do not use any tobacco products. This includes cigarettes, chewing tobacco, and electronic cigarettes. If you need help quitting, ask your health care provider.  Take medicines only as directed by your health care provider.  Eat a healthy diet. Work with a dietitian to make sure you are getting the nutrition you need.  Consider joining a support group or seeking counseling to help you cope with the stress of having lung cancer.  Let your cancer specialist (oncologist) know if you are admitted to the hospital.  Keep all follow-up  visits as directed by your health care provider. This is important. SEEK MEDICAL CARE IF:   You lose weight without trying.  You have a persistent cough and wheezing.  You feel short of breath.  You tire easily.  You experience bone or joint pain.  You have difficulty swallowing.  You feel hoarse or notice your voice changing.  Your pain medicine is not helping. SEEK IMMEDIATE MEDICAL CARE IF:   You cough up blood.  You have new breathing problems.  You develop chest pain.  You develop swelling in:  One or both ankles or legs.  Your face, neck, or arms.  You are confused.  You experience paralysis in your face or a drooping eyelid.   This information is not intended to replace advice given to you by your health care provider. Make sure you discuss any questions you have with your health care provider.   Document Released: 07/24/2000 Document Revised: 01/06/2015 Document Reviewed: 08/21/2013 Elsevier Interactive Patient Education 2016 [employer] Lung Resection A lung resection is a procedure to remove part or all of a lung. When an entire lung is removed, the procedure is called a pneumonectomy. When only part of a lung is removed, the procedure is called a lobectomy. A lung resection is typically done to get rid of a tumor or cancer, but it may be done to treat other conditions. This procedure can help relieve some or all of your symptoms and can also help keep the problem from getting worse. Lung resection may provide the best chance for curing your disease. However, the procedure may not necessarily cure lung cancer if that is the problem.  LET [facility] CARE PROVIDER KNOW ABOUT:   Any allergies you have.  All medicines you are taking, including vitamins, herbs, eye drops, creams, and over-the-counter medicines.  Previous problems you or members of your family have had with the use of anesthetics.  Any blood disorders you have.  Previous surgeries you have  had.  Medical conditions you have. RISKS AND COMPLICATIONS  Generally, lung resection is a safe procedure. However, problems can occur and include:  Excessive bleeding.  Infection.  Inability to breathe without a ventilator.  Persistent shortness of breath.  Heart problems, including abnormal rhythms and a risk of heart attack or heart failure.  Blood clots.  Injury to a blood vessel.  Injury to a nerve.  Failure to heal properly.  Stroke.  Bronchopleural fistula. This is a small hole between one of the main breathing tubes (bronchus) and the lining of the lungs. This is rare.  Reaction to anesthesia. BEFORE THE PROCEDURE  You may have tests done before the procedure, including:  Blood tests.  Urine tests.  X-rays.  Other imaging tests (such as CT scans, MRI scans, and PET scans). These tests are done to find the exact size and location of the condition being treated with  this surgery.  Pulmonary function tests. These are breathing tests to assess the function of your lungs before surgery and to decide how to best help your breathing after surgery.  Heart testing. This is done to make sure your heart is strong enough for the procedure.  Bronchoscopy. This is a technique that allows your health care provider to look at the inside of your airways. This is done using a soft, flexible tube (bronchoscope). Along with imaging tests, this can help your health care provider know the exact location and size of the area that will be removed during surgery.  Lymph node sampling. This may need to be done to see if the tumor has spread. It may be done as a separate surgery or right before your lung resection procedure. PROCEDURE  An IV tube will be placed in your arm. You will be given a medicine that makes you fall asleep (general anesthetic). You may also get pain medicine through a thin, flexible tube (catheter) in your back.  A breathing tube will be placed in your  throat.  Once the surgical team has prepared you for surgery, your surgeon will make an incision on your side. Some resections are done through large incisions, while others can be done through small incisions using smaller instruments and assisted with small cameras (laparoscopic surgery).  Your surgeon will carefully cut the veins, arteries, and bronchus leading to your lung. After being cut, each of these pieces will be sewn or stapled closed. The lung or part of the lung will then be removed.  Your surgeon will check inside your chest to make sure there is no bleeding in or around the lungs. Lymph nodes near the lung may also be removed for later tests.  Your surgeon may put tubes into your chest to drain extra fluid and air after surgery.  Your incision will be closed. This may be done using:  Stitches that absorb into your body and do not need to be removed.  Stitches that must be removed.  Staples that must be removed. AFTER THE PROCEDURE   You will be taken to the recovery area and your progress will be monitored. You may still have a breathing tube and other tubes or catheters in your body immediately after surgery. These will be removed during your recovery. You may be put on a respirator following surgery if some assistance is needed to help your breathing. When you are awake and not experiencing immediate problems from surgery, you will be moved to the intensive care unit (ICU) where you will continue your recovery.  You may feel pain in your chest and throat. Sometimes during recovery, patients may shiver or feel nauseous. You will be given medicine to help with pain and nausea.  The breathing tube will be taken out as soon as your health care providers feel you can breathe on your own. For most people, this happens on the same day as the surgery.  If your surgery and time in the ICU go well, most of the tubes and equipment will be taken out within 1-2 days after surgery. This  is about how long most people stay in the ICU. You may need to stay longer, depending on how you are doing.  You should also start respiratory therapy in the ICU. This therapy uses breathing exercises to help your other lung stay healthy and get stronger.  As you improve, you will be moved to a regular hospital room for continued respiratory therapy, help with  your bladder and bowels, and to continue medicines.  After your lung or part of your lung is taken out, there will be a space inside your chest. This space will often fill up with fluid over time. The amount of time this takes is different for each person.  You will receive care until you are doing well and your health care provider feels it is safe for you to go home or to transfer to an extended care facility.   This information is not intended to replace advice given to you by your health care provider. Make sure you discuss any questions you have with your health care provider.   Document Released: 07/08/2002 Document Revised: 05/08/2014 Document Reviewed: 06/06/2013 Elsevier Interactive Patient Education 2016 [employer].  Lung Resection, Care After Refer to this sheet in the next few weeks. These instructions provide you with information on caring for yourself after your procedure. Your health care provider may also give you more specific instructions. Your treatment has been planned according to current medical practices, but problems sometimes occur. Call your health care provider if you have any problems or questions after your procedure. WHAT TO EXPECT AFTER THE PROCEDURE After your procedure, it is typical to have the following:   You may feel pain in your chest and throat.  Patients may sometimes shiver or feel nauseous during recovery. HOME CARE INSTRUCTIONS  You may resume a normal diet and activities as directed by your health care provider.  Do not use any tobacco products, including cigarettes, chewing tobacco, or  electronic cigarettes. If you need help quitting, ask your health care provider.  There are many different ways to close and cover an incision, including stitches, skin glue, and adhesive strips. Follow your health care provider's instructions on:  Incision care.  Bandage (dressing) changes and removal.  Incision closure removal.  Take medicines only as directed by your health care provider.  Keep all follow-up visits as directed by your health care provider. This is important.  Try to breathe deeply and cough as directed. Holding a pillow firmly over your ribs may help with discomfort.  If you were given an incentive spirometer in the hospital, continue to use it as directed by your health care provider.  Walk as directed by your health care provider.  You may take a shower and gently wash the area of your incision with water and soap as directed by your health care provider. Do not use anything else to clean your incision except as directed by your health care provider. Do not take baths, swim, or use a hot tub until your health care provider approves. SEEK MEDICAL CARE IF:  You notice redness, swelling, or increasing pain at the incision site.  You are bleeding at the incision site.  You see pus coming from the incision site.  You notice a bad smell coming from the incision site or bandage.  Your incision breaks open.  You cough up blood or pus, or you develop a cough that produces bad-smelling sputum.  You have pain or swelling in your legs.  You have increasing pain that is not controlled with medicine.  You have trouble managing any of the tubes that have been left in place after surgery.  You have fever or chills. SEEK IMMEDIATE MEDICAL CARE IF:   You have chest pain or an irregular or rapid heartbeat.  You have dizzy episodes or faint.  You have shortness of breath or difficulty breathing.  You have persistent nausea  or vomiting.  You have a rash.   This  information is not intended to replace advice given to you by your health care provider. Make sure you discuss any questions you have with your health care provider.   Document Released: 11/04/2004 Document Revised: 05/08/2014 Document Reviewed: 06/06/2013 Elsevier Interactive Patient Education [employer].

## 2015-02-18 NOTE — Progress Notes (Signed)
Quick Note:  Spoke with the pt's spouse and notified of results and he verbalized understanding and will inform the pt ______

## 2015-02-18 NOTE — Progress Notes (Signed)
[address]       [place] [address]             [phone]                    Heena E Mckoy Thornton Medical Record [id number] Date of Birth: [birth year]  Referring: Tanda Rockers, MD Primary Care: [name], MD  Chief Complaint:    Chief Complaint  Patient presents with  . Lung Lesion    Surgical eval, PET Scan 02/10/15, Chest CT 01/29/15, Spirometry 02/15/15    History of Present Illness:    [name] 74 y.o. female is seen in the office  today for evaluation of new discovery of left upper lobe lesion x2 found on chest xray done for cough and cold symptoms. Patient is distant smoker , for 7 years quit in 1960. Now has some SOB climbing stairs or hills but other wise with out complaints. Patient has history cad and visceral a aneurysms.      Current Activity/ Functional Status:  Patient is independent with mobility/ambulation, transfers, ADL's, IADL's.   Zubrod Score: At the time of surgery this patient's most appropriate activity status/level should be described as: []     0    Normal activity, no symptoms [x]     1    Restricted in physical strenuous activity but ambulatory, able to do out light work []     2    Ambulatory and capable of self care, unable to do work activities, up and about               >50 % of waking hours                              []     3    Only limited self care, in bed greater than 50% of waking hours []     4    Completely disabled, no self care, confined to bed or chair []     5    Moribund   Past Medical History  Diagnosis Date  . Hypothyroidism (acquired)     hypo  . Cervical disc disease   . Polymyalgia rheumatica (HCC)     2011 Dr. Charlestine Night  . Anemia   . Hyperlipidemia   . S/P CABG x 1 1996    CABG--LIMA-LAD for ostial LAD (not felt to be PCI amenable). EF NORMAL then; LIMA now atretic  . Abdominal aortic aneurysm (HCC)     REPAIRED IN 1993 BY [name]  AND HAS RECENTLY BEEN FOLLOWED BY DR VAN  TRIGHT  . 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 Rheumatoid arthritis ([facility]) 2011    Dr.Truslow; fracture knees, hands and wrists -   . CAD in native artery 1996, 2002, 2005     Status post CABG x1 with LIMA-LAD for 90% stenosis in the ostial LAD reduced to 50% in 2002 and 30% in 2005.;  Atretic LIMA  . COPD (chronic obstructive pulmonary disease) ([facility])   recent work up for TIA in [place] - negative   Past Surgical History  Procedure Laterality Date  . Splenectomy    . Cardiac catheterization    . Cervical spine surgery      plate 2008 Dr. Saintclair Halsted  . Carpal tunnel release    .  Cataract extraction    . Hernia repair    . Bunionectomy  07/2011    right foot  . Coronary artery bypass graft  1996    INCLUDED AN INTERNAL MAMMARY ARTERY TO THE LAD. EF WAS NORMAL  . 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 Eye surgery      Family History  Problem Relation Age of Onset  . Hypertension    . Coronary artery disease    . Asthma Neg Hx   . Colon cancer Neg Hx   . Hypertension Mother   . Diabetes Mother   . Heart disease Mother   . Hyperlipidemia Mother   . Stroke Father   . Hyperlipidemia Sister   . Hypertension Sister   . Hypertension Daughter   . Cancer Paternal Uncle     Deceased from cancer not sure of site  . Hypertension Son   . Hyperlipidemia Son   . Hyperlipidemia Son   . Hypertension Son   . Hyperlipidemia Son   . Hypertension Son     Social History   Social History  . Marital Status: Married    Spouse Name: N/A  . Number of Children: 4  . Years of Education: N/A   Occupational History  . retired    Social History Main Topics  . Smoking status: Former Smoker    Quit date: 12/04/1958  . Smokeless tobacco: Never Used  . Alcohol Use: 0.6 - 1.8 oz/week    1-3 Standard drinks or  equivalent per week  . Drug Use: No  . Sexual Activity: Not Currently          Social History Narrative   [facility] [employer]  MARRIED -RETIRED ; [place]. GOES TO [place] 3 DAYS A WEEK FOR ABOUT 45 MIN. PER SESSION      Living at [facility] since dec 2015    History  Smoking status  . Former Smoker  . Quit date: 12/04/1958  Smokeless tobacco  . Never Used    History  Alcohol Use  . 0.6 - 1.8 oz/week  . 1-3 Standard drinks or equivalent per week     Allergies  Allergen Reactions  . Aspirin Other (See Comments)    Hurts stomach  . Codeine Nausea And Vomiting  . Nitroglycerin Other (See Comments)    Heart rate drops    Current Outpatient Prescriptions  Medication Sig Dispense Refill  . atorvastatin (LIPITOR) 80 MG tablet Take 1 tablet (80 mg total) by mouth daily. 90 tablet 3  . cetirizine (ZYRTEC) 10 MG tablet Take 10 mg by mouth daily.    . clopidogrel (PLAVIX) 75 MG tablet Take 1 tablet (75 mg total) by mouth daily. 90 tablet 3  . leflunomide (ARAVA) 20 MG tablet Take 1 tablet (20 mg total) by mouth every other day. 15 tablet 6  . levothyroxine (LEVOXYL) 88 MCG tablet Take 1 tablet (88 mcg total) by mouth daily. 90 tablet 3  . losartan (COZAAR) 25 MG tablet Take 1 tablet (25 mg total) by mouth daily. 90 tablet 3  . metoprolol succinate (TOPROL-XL) 25 MG 24 hr tablet Take 1 tablet (25 mg total) by mouth daily. 90 tablet 3  . Probiotic Product (ALIGN) 4 MG CAPS Take 1 capsule by mouth daily. 30 capsule 1  . psyllium (METAMUCIL) 58.6 % packet Take 1 packet by mouth daily.    [name]  amoxicillin-clavulanate (AUGMENTIN) 875-125 MG tablet Take 1 tablet by mouth 2 (two) times daily. (Patient not taking: Reported on 02/15/2015) 20 tablet 0   No current facility-administered medications for this visit.     Review of Systems:     Cardiac Review of Systems: Y or N  Chest Pain [ n   ]  Resting SOB [  n ] Exertional SOB  [n  ]  Orthopnea  [  n]   Pedal Edema [  n ]    Palpitations n[  ] Syncope  [ n ]   Presyncope [  n ]  General Review of Systems: [Y] = yes [  ]=no Constitional: recent weight change [ n ];  Wt loss over the last 3 months [   ] anorexia [  ]; fatigue [  ]; nausea [  ]; night sweats [  ]; fever [  ]; or chills [  ];          Dental: poor dentition[  ]; Last Dentist visit:   Eye : blurred vision [  ]; diplopia [   ]; vision changes [  ];  Amaurosis fugax[  ]; Resp: cough [  ];  wheezing[  ];  hemoptysis[  ]; shortness of breath[  ]; paroxysmal nocturnal dyspnea[  ]; dyspnea on exertion[  ]; or orthopnea[  ];  GI:  gallstones[  ], vomiting[  ];  dysphagia[  ]; melena[  ];  hematochezia [  ]; heartburn[  ];   Hx of  Colonoscopy[  ]; GU: kidney stones [  ]; hematuria[  ];   dysuria [  ];  nocturia[  ];  history of     obstruction [  ]; urinary frequency [  ]             Skin: rash, swelling[  ];, hair loss[  ];  peripheral edema[  ];  or itching[  ]; Musculosketetal: myalgias[  ];  joint swelling[  ];  joint erythema[  ];  joint pain[  ];  back pain[  ];  Heme/Lymph: bruising[  ];  bleeding[  ];  anemia[  ];  Neuro: TIA[several months ago  ];  headaches[  ];  stroke[n  ];  vertigo[  ];  seizures[  ];   paresthesias[  ];  difficulty walking[  ];  Psych:depression[  ]; anxiety[  ];  Endocrine: diabetes[  ];  thyroid dysfunction[  ];  Immunizations: Flu up to date [  ]; Pneumococcal up to date [  ];  Other:  Physical Exam: BP 148/72 mmHg  Pulse 80  Resp 20  Ht 5' 3.5" (1.613 m)  Wt 152 lb (68.947 kg)  BMI 26.50 kg/m2  SpO2 93%  PHYSICAL EXAMINATION: General appearance: alert, cooperative and no distress Head: Normocephalic, without obvious abnormality, atraumatic Neck: no adenopathy, no carotid bruit, no JVD, supple, symmetrical, trachea midline and thyroid not enlarged, symmetric, no tenderness/mass/nodules Lymph nodes: Cervical, supraclavicular, and axillary nodes normal. Resp: clear to auscultation  bilaterally Back: symmetric, no curvature. ROM normal. No CVA tenderness. Cardio: regular rate and rhythm, S1, S2 normal, no murmur, click, rub or gallop GI: soft, non-tender; bowel sounds normal; no masses,  no organomegaly Extremities: extremities normal, atraumatic, no cyanosis or edema and Homans sign is negative, no sign of DVT Neurologic: Grossly normal  Diagnostic Studies & Laboratory data:     Recent Radiology Findings:   Dg Chest 2 View  01/29/2015  CLINICAL DATA:  Seven day history of cough  EXAM: CHEST  2 VIEW COMPARISON:  None. FINDINGS: Lungs are somewhat hyperexpanded. There is no edema or consolidation. On the lateral view, there is a nodular opacity measuring 1.5 x 1.4 cm at the level of the anterior upper thoracic spine. This opacity is not seen on the frontal view. Patient is status post internal mammary bypass grafting. Heart size and pulmonary vascularity are within normal limits. No adenopathy. No bone lesions. IMPRESSION: 1.5 x 1.4 cm nodular opacity at the level of the anterior upper thoracic spine seen on the lateral view. This finding warrants noncontrast enhanced chest CT to further assess. Lungs are hyperexpanded.  No edema or consolidation. These results will be called to the ordering clinician or representative by the Radiologist Assistant, and communication documented in the PACS or zVision Dashboard. Electronically Signed   By: Lowella Grip III M.D.   On: 01/29/2015 14:41   Ct Chest Wo Contrast  01/29/2015  CLINICAL DATA:  74 year old female with nodule identified on chest x-ray today. EXAM: CT CHEST WITHOUT CONTRAST TECHNIQUE: Multidetector CT imaging of the chest was performed following the standard protocol without IV contrast. COMPARISON:  01/29/2015 and prior chest radiographs. FINDINGS: Mediastinum/Nodes: Cardiac surgical changes are identified. Aortic atherosclerotic calcifications are present without thoracic aortic aneurysm. There is no evidence of  pericardial effusion or enlarged lymph nodes. Lungs/Pleura: A 1.5 x 1.6 cm left upper lobe nodule (image 20) with tiny focus of internal gas is noted. A 7 mm left upper lobe nodule (image 28) is also identified. There is no evidence of airspace disease, consolidation, endobronchial lesion or pleural effusion. Upper abdomen: No acute abnormalities identified. Musculoskeletal: Sclerosis of a lower thoracic vertebra (probably T11). No other acute or suspicious bony abnormalities are identified. IMPRESSION: 1.5 x 1.6 cm left upper lobe nodule and 7 mm left upper lobe nodule, suspicious for malignancy/metastatic disease. Recommend thoracic surgery consultation or PET-CT in the absence of infectious symptoms. Sclerosis of the lower thoracic vertebral body (likely T11) -metastatic disease is not excluded. PET-CT may be helpful for further evaluation as well. Electronically Signed   By: Margarette Canada M.D.   On: 01/29/2015 17:50   Nm Pet Image Initial (pi) Skull Base To Thigh  02/10/2015  CLINICAL DATA:  Initial treatment strategy for pulmonary nodule. Irregular LEFT upper lobe pulmonary nodule. EXAM: NUCLEAR MEDICINE PET SKULL BASE TO THIGH TECHNIQUE: 7.8 mCi F-18 FDG was injected intravenously. Full-ring PET imaging was performed from the skull base to thigh after the radiotracer. CT data was obtained and used for attenuation correction and anatomic localization. FASTING BLOOD GLUCOSE:  Value: 80 mg/dl COMPARISON:  CT 01/29/2015 FINDINGS: NECK No hypermetabolic lymph nodes in the neck. CHEST 16 mm hypermetabolic nodule in the LEFT upper lobe with SUV max equal 4.8 (image 50 fused data set). There is mild atelectasis at lung bases. No additional pulmonary nodules No hypermetabolic mediastinal or supraclavicular lymph nodes. ABDOMEN/PELVIS No abnormal hypermetabolic activity within the liver, pancreas, adrenal glands, or spleen. No hypermetabolic lymph nodes in the abdomen or pelvis. Low-density lesion in the LEFT kidney  without metabolic activity. New SKELETON Intense uptake at the facet joint at L5-S1 on the RIGHT which is felt to be degenerative in nature. Sclerotic T11 vertebral body does not have metabolic activity is most consistent with posttraumatic injury. IMPRESSION: 1. Hypermetabolic LEFT upper lobe pulmonary nodule is concerning for primary bronchogenic carcinoma. 2. Staging by FDG PET imaging T1a N0 M0 3. No evidence of mediastinal nodal metastasis or distant metastasis. Electronically Signed  By: Suzy Bouchard M.D.   On: 02/10/2015 15:18     I have independently reviewed the above radiologic studies.  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 I have recommended proceeding with surgical resection. With history of cabg 1996 and known abdominal vascular disease will have cardiology see preop. Full PFTs to be done Patient on Plavix. Will see back after cleared by cardiology and we'll plan when to discontinue Plavix and proceed with surgery.      I  spent 40 minutes counseling the patient face to face and 50% or more the  time was spent in counseling and coordination of care. The total time spent in the appointment was 60 minutes.  Grace Isaac MD      [address] [place] [address] Office [phone]   Beeper [phone]  02/18/2015 12:56 PM

## 2015-02-19 ENCOUNTER — Ambulatory Visit (INDEPENDENT_AMBULATORY_CARE_PROVIDER_SITE_OTHER): Payer: Medicare Other | Admitting: Cardiology

## 2015-02-19 ENCOUNTER — Ambulatory Visit (HOSPITAL_COMMUNITY)
Admission: RE | Admit: 2015-02-19 | Discharge: 2015-02-19 | Disposition: A | Discharge status: Home / Self Care | Payer: Medicare Other | Source: Ambulatory Visit | Attending: Cardiothoracic Surgery | Admitting: Cardiothoracic Surgery

## 2015-02-19 ENCOUNTER — Encounter: Payer: Self-pay | Admitting: Cardiology

## 2015-02-19 VITALS — BP 148/92 | HR 70 | Ht 63.0 in | Wt 154.3 lb

## 2015-02-19 DIAGNOSIS — I10 Essential (primary) hypertension: Secondary | ICD-10-CM

## 2015-02-19 DIAGNOSIS — I2510 Atherosclerotic heart disease of native coronary artery without angina pectoris: Secondary | ICD-10-CM

## 2015-02-19 DIAGNOSIS — Z01810 Encounter for preprocedural cardiovascular examination: Secondary | ICD-10-CM

## 2015-02-19 DIAGNOSIS — R911 Solitary pulmonary nodule: Secondary | ICD-10-CM | POA: Insufficient documentation

## 2015-02-19 DIAGNOSIS — R0609 Other forms of dyspnea: Secondary | ICD-10-CM | POA: Diagnosis not present

## 2015-02-19 DIAGNOSIS — E785 Hyperlipidemia, unspecified: Secondary | ICD-10-CM

## 2015-02-19 DIAGNOSIS — G459 Transient cerebral ischemic attack, unspecified: Secondary | ICD-10-CM

## 2015-02-19 DIAGNOSIS — F17210 Nicotine dependence, cigarettes, uncomplicated: Secondary | ICD-10-CM | POA: Insufficient documentation

## 2015-02-19 DIAGNOSIS — R05 Cough: Secondary | ICD-10-CM | POA: Diagnosis not present

## 2015-02-19 DIAGNOSIS — Z951 Presence of aortocoronary bypass graft: Secondary | ICD-10-CM

## 2015-02-19 DIAGNOSIS — Z7901 Long term (current) use of anticoagulants: Secondary | ICD-10-CM

## 2015-02-19 LAB — PULMONARY FUNCTION TEST
DL/VA % pred: 78 %
DL/VA: 3.67 ml/min/mmHg/L
DLCO unc % pred: 71 %
DLCO unc: 16.27 ml/min/mmHg
FEF 25-75 Post: 1.01 L/sec
FEF 25-75 Pre: 0.66 L/sec
FEF2575-%Change-Post: 51 %
FEF2575-%Pred-Post: 66 %
FEF2575-%Pred-Pre: 43 %
FEV1-%Change-Post: 12 %
FEV1-%Pred-Post: 90 %
FEV1-%Pred-Pre: 80 %
FEV1-Post: 1.78 L
FEV1-Pre: 1.58 L
FEV1FVC-%Change-Post: 7 %
FEV1FVC-%Pred-Pre: 82 %
FEV6-%Change-Post: 4 %
FEV6-%Pred-Post: 102 %
FEV6-%Pred-Pre: 97 %
FEV6-Post: 2.55 L
FEV6-Pre: 2.44 L
FEV6FVC-%Change-Post: 0 %
FEV6FVC-%Pred-Post: 100 %
FEV6FVC-%Pred-Pre: 100 %
FVC-%Change-Post: 4 %
FVC-%Pred-Post: 101 %
FVC-%Pred-Pre: 97 %
FVC-Post: 2.68 L
FVC-Pre: 2.56 L
Post FEV1/FVC ratio: 66 %
Post FEV6/FVC ratio: 95 %
Pre FEV1/FVC ratio: 62 %
Pre FEV6/FVC Ratio: 95 %
RV % pred: 123 %
RV: 2.81 L
TLC % pred: 110 %
TLC: 5.41 L

## 2015-02-19 MED ORDER — ALBUTEROL SULFATE (2.5 MG/3ML) 0.083% IN NEBU
2.5000 mg | INHALATION_SOLUTION | Freq: Once | RESPIRATORY_TRACT | Status: AC
  Administered 2015-02-19: 2.5 mg via RESPIRATORY_TRACT

## 2015-02-19 NOTE — Progress Notes (Signed)
PCP: [name], MD  Clinic Note: Chief Complaint  Patient presents with  . Annual Exam    pt denied chest pain and SOB  . Medical Clearance    Lung Cancer by Dr Servando Snare no date set as yet    HPI: [name] is a 74 y.o. female with a PMH below who presents today for annual follow-up of CAD status post LIMA and LAD in 1996. She is also a history of ruptured AAA status post open repair. 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 She is followed by Dr.Brabham from [facility] for AAA/Mesenteric,Celiac & Splenic Artery disease. --> @ one time thought to be FMD versus polyarteritis --has been treated with steroids in the past as well.. Last visit with vascular surgery was in January 2016 -- she was noted to have stable aneurysmal changes on MRI. Follow-up catheterizations have shown an atretic LIMA with improved LAD flow with now 50% ostial LAD. She is a former smoker who quit in 1960. Referred to pulmonary medicine for new cough.  Cchest x-ray suggested pulmonary nodule.  [name] was last seen in late August at 2015. She was doing well at that time.  Recent Hospitalizations: Early September at [facility] - admitted for TIA  Studies Reviewed:  Echo @ [facility] 12/31/2014: Reviewed in Care Everywhere There is normal left ventricular size. Global left ventricular systolic function is normal. The estimated left ventricular ejection fraction is 55 - 60%. The interatrial septum is not well visualized, but appears to be grossly intact. No significant valvular abnormalities.  Carotid US - normal. Ct Head & MRI Brain ~normal   CT  Chest September 30:   1.5 x 1.6 cm left upper lobe nodule and 7 mm left upper lobe nodule, suspicious for malignancy/metastatic disease.   Recommend thoracic surgery consultation or PET-CT in the absence of infectious symptoms.  Sclerosis of the lower thoracic  vertebral body (likely T11) -metastatic disease is not excluded.   PET-CT may be helpful for further evaluation as well  PET Scan:  1. Hypermetabolic LEFT upper lobe pulmonary nodule is concerning for primary bronchogenic carcinoma. 2. Staging by FDG PET imaging T1a N0 M0 3. No evidence of mediastinal nodal metastasis or distant metastasis   She was recently seen by Dr. Servando Snare from [place] surgery.  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  Structural resection was recommended. She is now here for annual follow-up and for preoperative evaluation. Will clearly need Plavix held for surgery   Interval History: [name] presents now for a delayed annual follow-up. Her routine follow-up is probably delayed because of her recent evaluation this finding the lung nodule. She really has been stable overall from a cardiology standpoint. Unfortunately in August she had a TIA or CVA while on vacation in [place].  She was seen and evaluated at  [facility]  Her symptoms resolved after ~4hrs.  Negative work-up including a relatively normal echocardiogram as described above. .   She has not been symptomatic as far as any anginal symptoms with rest or exertion.  She does not have any exertional dyspnea with baseline exertion, however if she were to go uphill or upstairs she may note a little bit of dyspnea, but this is not a new finding. She does water exercises with her husband to 3 times a week and walks around at friend's home. This is more than she had been doing in quite  some time, therefore she actually is very stable from a cardiac standpoint.  No PND, orthopnea or edema. No palpitations, lightheadedness, dizziness, weakness or syncope/near syncope. No recurrent TIA/amaurosis fugax symptoms since August.. No claudication.  ROS: A comprehensive was performed. Review of Systems  Constitutional: Negative for malaise/fatigue (Just a little bit more scared to do things that she used to be.).    Respiratory: Positive for cough. Negative for sputum production.   Cardiovascular: Negative for claudication.  Neurological:       No recurrent TIA episodes as indicated in HPI  Psychiatric/Behavioral: Negative for depression.       Under a lot more stress now with all these new diagnoses.  All other systems reviewed and are negative.  Past Medical History  Diagnosis Date  . Hypothyroidism (acquired)     hypo  . Cervical disc disease   . Polymyalgia rheumatica (HCC)     2011 Dr. Charlestine Night  . Anemia   . Hyperlipidemia   . S/P CABG x 1 1996    CABG--LIMA-LAD for ostial LAD (not felt to be PCI amenable). EF NORMAL then; LIMA now atretic  . Abdominal aortic aneurysm (HCC)     REPAIRED IN 1993 BY [name]  AND HAS RECENTLY BEEN FOLLOWED BY [name]  . 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 Rheumatoid arthritis ([place]) 2011    Dr.Truslow; fracture knees, hands and wrists -   . CAD in native artery 1996, 2002, 2005     Status post CABG x1 with LIMA-LAD for ostial LAD 90% stenosis --> down to 50% in 2002 and 30% in 2005.;  Atretic LIMA; Myoview 06/2010: Fixed anteroseptal, apical and inferoapical defect with moderate size. Most likely scar. Mild subendocardial ischemia. EF 71% LOW RISK.   [name] COPD (chronic obstructive pulmonary disease) [facility])     Past Surgical History  Procedure Laterality Date  . Splenectomy    . Cardiac catheterization  2005    (Most recent CATH) - ostial LAD lesion 20-30% (down from 90% initially). Atretic LIMA. Minimal disease the RCA and Circumflex system.  . Cervical spine surgery      plate 2008 Dr. Saintclair Halsted  . Carpal tunnel release    . Cataract extraction    . Hernia repair    . Bunionectomy  07/2011    right foot  . Coronary artery bypass graft  1996    INCLUDED AN INTERNAL MAMMARY ARTERY TO THE LAD. EF WAS NORMAL  . Abdominal aortic aneurysm repair  [id number]    Complicated by mesenteric  artery stenosis and splenic artery infarction with acquired Asplenia  . Eye surgery    . Nm myoview ltd  March 2012    Fixed anteroseptal, apical and inferoapical defect with moderate size. Most likely scar. Mild subendocardial ischemia. EF 71% LOW RISK.     Prior to Admission medications   Medication Sig Start Date End Date Taking? Authorizing Provider  amoxicillin-clavulanate (AUGMENTIN) 875-125 MG tablet Take 1 tablet by mouth 2 (two) times daily. Patient not taking: Reported on 02/15/2015 01/29/15   Tereasa Coop, PA-C  atorvastatin (LIPITOR) 80 MG tablet Take 1 tablet (80 mg total) by mouth daily. 02/01/15   Aleksei Plotnikov V, MD  cetirizine (ZYRTEC) 10 MG tablet Take 10 mg by mouth daily.    Historical Provider, MD  clopidogrel (PLAVIX) 75 MG tablet Take 1 tablet (75 mg total) by mouth daily. 07/02/14  Aleksei Plotnikov V, MD  leflunomide (ARAVA) 20 MG tablet Take 1 tablet (20 mg total) by mouth every other day. 02/26/14   Aleksei Plotnikov V, MD  levothyroxine (LEVOXYL) 88 MCG tablet Take 1 tablet (88 mcg total) by mouth daily. 07/02/14   Aleksei Plotnikov V, MD  losartan (COZAAR) 25 MG tablet Take 1 tablet (25 mg total) by mouth daily. 07/02/14   Aleksei Plotnikov V, MD  metoprolol succinate (TOPROL-XL) 25 MG 24 hr tablet Take 1 tablet (25 mg total) by mouth daily. 07/02/14   Aleksei Plotnikov V, MD  Probiotic Product (ALIGN) 4 MG CAPS Take 1 capsule by mouth daily. 11/26/14   Aleksei Plotnikov V, MD  psyllium (METAMUCIL) 58.6 % packet Take 1 packet by mouth daily.    Historical Provider, MD   Allergies  Allergen Reactions  . Aspirin Other (See Comments)    Hurts stomach  . Codeine Nausea And Vomiting  . Nitroglycerin Other (See Comments)    Heart rate drops    Social History   Social History  . Marital Status: Married    Spouse Name: N/A  . Number of Children: 4  . Years of Education: N/A   Occupational History  . retired    Social History Main Topics  . Smoking status:  Former Smoker    Quit date: 12/04/1958  . Smokeless tobacco: Never Used  . Alcohol Use: 0.6 - 1.8 oz/week    1-3 Standard drinks or equivalent per week  . Drug Use: No  . Sexual Activity: Not Currently   Other Topics Concern  . Not on file   Social History Narrative   Regular [facility]- [facility] the [facility]  [place] ; [place]. GOES TO [employer] 3 DAYS A WEEK FOR ABOUT 45 MIN. PER SESSION      Living at [facility] since dec 2015    Family History  Problem Relation Age of Onset  . Hypertension    . Coronary artery disease    . Asthma Neg Hx   . Colon cancer Neg Hx   . Hypertension Mother   . Diabetes Mother   . Heart disease Mother   . Hyperlipidemia Mother   . Stroke Father   . Hyperlipidemia Sister   . Hypertension Sister   . Hypertension Daughter   . Cancer Paternal Uncle     Deceased from cancer not sure of site  . Hypertension Son   . Hyperlipidemia Son   . Hyperlipidemia Son   . Hypertension Son   . Hyperlipidemia Son   . Hypertension Son      Wt Readings from Last 3 Encounters:  02/19/15 154 lb 4.8 oz (69.99 kg)  02/18/15 152 lb (68.947 kg)  02/15/15 155 lb 6.4 oz (70.489 kg)    PHYSICAL EXAM BP 148/92 mmHg  Pulse 70  Ht 5\' 3"  (1.6 m)  Wt 154 lb 4.8 oz (69.99 kg)  BMI 27.34 kg/m2  @ home BP: Systolic Blood pressures have been more in the 120-130 mm range. General appearance: alert, cooperative, appears stated age, no distress and Healthy-appearing. Answers questions appropriately HEENT: [place]/AT, EOMI, MMM, anicteric sclera  Neck: no adenopathy, no carotid bruit, no JVD and supple, symmetrical, trachea midline  Lungs: clear to auscultation bilaterally, normal percussion bilaterally and Nonlabored, and good air movement  Heart: regular rate and rhythm, S1, S2 normal, no murmur, click, rub or gallop and prominent apical impulse  Abdomen: soft, non-tender; bowel sounds normal; no masses, no organomegaly and  Well healed  abdominal aneurysm scar. No pulsatile mass. No obvious bruit  Extremities: extremities normal, atraumatic, no cyanosis or edema, varicose veins noted, venous stasis dermatitis noted and An extensive spider veins noted; more prominent on left than right  Pulses: 2+ and symmetric  Neurologic: Grossly normal     Adult ECG Report  Rate: 70 ;  Rhythm: normal sinus rhythm and Normal axis, intervals and durations;   Narrative Interpretation: Normal EKG  Other studies Reviewed: Additional studies/ records that were reviewed today include:  Recent Labs:   Lab Results  Component Value Date   CHOL 168 11/26/2014   HDL 75.10 11/26/2014   LDLCALC 71 11/26/2014   TRIG 111.0 11/26/2014   CHOLHDL 2 11/26/2014    ASSESSMENT / PLAN: Bahar does not have active CAD or heart failure symptoms. She does not have end-stage renal disease, or diabetes on insulin. Her planned operation is a intrathoracic operation therefore be considered a high risk surgery. She has had a recent TIA suggestive of cerebrovascular disease (the presence of carotid artery or other serious cerebral artery disease given the extent of her peripheral vascular disease is not unexpected).  Problem List Items Addressed This Visit    TIA (transient ischemic attack)    The presence of the TIA does add to the concerning risks for her thoracic surgery. She is now on aspirin and Plavix as much for prevention of recurrent TIA/CVA as for coronary and vascular disease. I would want her to be on at least aspirin if Plavix needs to be held for surgery. She would take 324 mg on day 1 and then daily aspirin until able to restart Plavix. Unfortunately this recent event does increase her preoperative risk.      Solitary pulmonary nodule   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; now atretic and significantly improved LAD lesion without intervention (Chronic)    Unfortunate it use of LIMA for a lesion in the LAD there is no longer considered  to be significant. However doing well with native vasculature. Has not had any issues since 2005.      Pre-operative cardiovascular examination    PREOPERATIVE CARDIAC RISK ASSESSMENT   Revised Cardiac Risk Index:  High Risk Surgery: yes;   Defined as Intraperitoneal, intrathoracic or suprainguinal vascular  Active CAD: no;   CHF: no;   Cerebrovascular Disease: yes; Recent TIA  Diabetes: no; On Insulin: no  CKD (Cr >~ 2): no;   Total: 2 Estimated Risk of Adverse Outcome: Class III/Moderate  Estimated Risk of MI, PE, VF/VT (Cardiac Arrest), Complete Heart Block: ~6.6 % -- this percentage is reduced by roughly 1/2 with her being on base long standing beta blocker dose.   ACC/AHA Guidelines for "Clearance":  Step 1 - Need for Emergency Surgery: No:   If Yes - go straight to OR with perioperative surveillance  Step 2 - Active Cardiac Conditions (Unstable Angina, Decompensated HF, Significant  Arrhytmias - Complete HB, Mobitz II, Symptomatic VT or SVT, Severe Aortic Stenosis - mean gradient > 40 mmHg, Valve area < 1.0 cm2):   No:   If Yes - Evaluate & Treat per ACC/AHA Guidelines  Step 3 -  Low Risk Surgery: No:   If Yes --> proceed to OR  If No --> Step 4  Step 4 - Functional Capacity >= 4 METS without symptoms: Yes  If Yes --> proceed to OR  If No --> Step 5  Step 5 --  Clinical Risk Factors (CRF)  1-2 or more CRFs: Yes  If Yes -- assess Surgical Risk, --   (High Risk Non-cardiac), Intraabdominal or thoracic vascular surgery --> Proceed to OR, or consider testing if it will change management.  No active CAD symptoms with recent relatively normal Echo.  Based on these evaluations, the patient would be a moderate risk patient for a high risk surgery. In the absence of any active cardiac symptoms, stress testing would not alter my management, therefore would not recommend stress test prior to her undergoing surgery.  Continue beta blocker perioperatively. If  blood pressure would tolerate, consider increasing to 50 mg in the preoperative waiting.  She will need to hold Plavix for surgery. Plan would be to hold Plavix and take 324 mg aspirin on day 1 followed by 81 mg daily until stable postop to restart Plavix.  Continue full dose Lipitor.  We will be available for consultative assistance perioperatively. Would also recommend having vascular surgical consultation on standby.       Relevant Orders   EKG 12-Lead   Long term (current) use of anticoagulants (Chronic)    She is agreeable to being on Plavix for the short-term, but has had definite abdominal/GI side effects on long-term aspirin. I think short  perioperative timeframe she should be okay.      Hyperlipidemia with target LDL less than 70 (Chronic)    Doing quite well by screening labs. On high-dose statin now. Based on the extent of her vascular disease, aggressive lipid management is warranted.      Essential hypertension (Chronic)    Pretty well-controlled currently. She says her home readings are better than the reading here today. Would consider the possibility of increasing Toprol if her per her pressure continues to be in this range especially preoperatively for added cardiovascular risk reduction.      Relevant Orders   EKG 12-Lead   Atherosclerosis of coronary artery without angina pectoris - Primary (Chronic)    She actually had single-vessel CABG with LIMA to the LAD for was thought to be an ostial LAD lesion. Subsequent cardiac catheterizations have revealed an atretic LIMA but essentially patent LAD. Her last cath is in 2005. She had a Myoview in 2012 that showed a fixed anteroseptal and inferoapical defect consistent with scar, no ischemia. Based on the fact that she has an occluded LIMA and patent LAD, she has not had any further input evaluation in the absence of symptoms.  Currently denies any active anginal symptoms with rest or exertion => would not warrant any  noninvasive ischemic evaluation, as it would not change my routine management. She is on stable dose of Toprol and losartan as well as on Plavix alone. She also takes high-dose Lipitor.      Relevant Orders   EKG 12-Lead      Current medicines are reviewed at length with the patient today. (+/- concerns) what to do with aspirin/Plavix for her surgery The following changes have been made: See below   HOLD CLOPIDOGREL 5 DAYS PRIOR TO SURGERY. THE ON THE FIRST DAY TAKE 4 BABY ASPIRIN AND THEN 1 TABLET DAILY UNTIL AFTER SURGERY THEN RESTART CLOPIDOGREL. -- If blood pressure at home is over 130 mmHg, we can increase Toprol to 50 mg until postop  NO OTHER CHANGES.  Your physician wants you to follow-up in [place].  Studies Ordered:   Orders Placed This Encounter  Procedures  . EKG 12-Lead      Leonie Man, M.D., M.S.  Interventional Cardiologist   Pager # [phone]

## 2015-02-19 NOTE — Patient Instructions (Signed)
YOU HAVE CLEARANCE FOR SURGERY.  HOLD CLOPIDOGREL 5 DAYS PRIOR TO SURGERY. THE ON THE FIRST DAY TAKE 4 BABY ASPIRIN AND THEN 1 TABLET DAILY UNTIL AFTER SURGERY THEN RESTART CLOPIDOGREL.   NO OTHER CHANGES.  Your physician wants you to follow-up in [place]. You will receive a reminder letter in the mail two months in advance. If you don't receive a letter, please call our office to schedule the follow-up appointment.  If you need a refill on your cardiac medications before your next appointment, please call your pharmacy.

## 2015-02-21 ENCOUNTER — Encounter: Payer: Self-pay | Admitting: Cardiology

## 2015-02-21 DIAGNOSIS — Z01810 Encounter for preprocedural cardiovascular examination: Secondary | ICD-10-CM | POA: Insufficient documentation

## 2015-02-21 NOTE — Assessment & Plan Note (Addendum)
The presence of the TIA does add to the concerning risks for her thoracic surgery. She is now on aspirin and Plavix as much for prevention of recurrent TIA/CVA as for coronary and vascular disease. I would want her to be on at least aspirin if Plavix needs to be held for surgery. She would take 324 mg on day 1 and then daily aspirin until able to restart Plavix. Unfortunately this recent event does increase her preoperative risk.

## 2015-02-21 NOTE — Assessment & Plan Note (Signed)
She is agreeable to being on Plavix for the short-term, but has had definite abdominal/GI side effects on long-term aspirin. I think short  perioperative timeframe she should be okay.

## 2015-02-21 NOTE — Assessment & Plan Note (Signed)
Unfortunate it use of LIMA for a lesion in the LAD there is no longer considered to be significant. However doing well with native vasculature. Has not had any issues since 2005.

## 2015-02-21 NOTE — Assessment & Plan Note (Signed)
PREOPERATIVE CARDIAC RISK ASSESSMENT   Revised Cardiac Risk Index:  High Risk Surgery: yes;   Defined as Intraperitoneal, intrathoracic or suprainguinal vascular  Active CAD: no;   CHF: no;   Cerebrovascular Disease: yes; Recent TIA  Diabetes: no; On Insulin: no  CKD (Cr >~ 2): no;   Total: 2 Estimated Risk of Adverse Outcome: Class III/Moderate  Estimated Risk of MI, PE, VF/VT (Cardiac Arrest), Complete Heart Block: ~6.6 % -- this percentage is reduced by roughly 1/2 with her being on base long standing beta blocker dose.   ACC/AHA Guidelines for "Clearance":  Step 1 - Need for Emergency Surgery: No:   If Yes - go straight to OR with perioperative surveillance  Step 2 - Active Cardiac Conditions (Unstable Angina, Decompensated HF, Significant  Arrhytmias - Complete HB, Mobitz II, Symptomatic VT or SVT, Severe Aortic Stenosis - mean gradient > 40 mmHg, Valve area < 1.0 cm2):   No:   If Yes - Evaluate & Treat per ACC/AHA Guidelines  Step 3 -  Low Risk Surgery: No:   If Yes --> proceed to OR  If No --> Step 4  Step 4 - Functional Capacity >= 4 METS without symptoms: Yes  If Yes --> proceed to OR  If No --> Step 5  Step 5 --  Clinical Risk Factors (CRF)   1-2 or more CRFs: Yes  If Yes -- assess Surgical Risk, --   (High Risk Non-cardiac), Intraabdominal or thoracic vascular surgery --> Proceed to OR, or consider testing if it will change management.  No active CAD symptoms with recent relatively normal Echo.  Based on these evaluations, the patient would be a moderate risk patient for a high risk surgery. In the absence of any active cardiac symptoms, stress testing would not alter my management, therefore would not recommend stress test prior to her undergoing surgery.  Continue beta blocker perioperatively. If blood pressure would tolerate, consider increasing to 50 mg in the preoperative waiting.  She will need to hold Plavix for surgery. Plan would be to  hold Plavix and take 324 mg aspirin on day 1 followed by 81 mg daily until stable postop to restart Plavix.  Continue full dose Lipitor.  We will be available for consultative assistance perioperatively. Would also recommend having vascular surgical consultation on standby.

## 2015-02-21 NOTE — Assessment & Plan Note (Signed)
Doing quite well by screening labs. On high-dose statin now. Based on the extent of her vascular disease, aggressive lipid management is warranted.

## 2015-02-21 NOTE — Assessment & Plan Note (Signed)
Pretty well-controlled currently. She says her home readings are better than the reading here today. Would consider the possibility of increasing Toprol if her per her pressure continues to be in this range especially preoperatively for added cardiovascular risk reduction.

## 2015-02-21 NOTE — Assessment & Plan Note (Addendum)
She actually had single-vessel CABG with LIMA to the LAD for was thought to be an ostial LAD lesion. Subsequent cardiac catheterizations have revealed an atretic LIMA but essentially patent LAD. Her last cath is in 2005. She had a Myoview in 2012 that showed a fixed anteroseptal and inferoapical defect consistent with scar, no ischemia. Based on the fact that she has an occluded LIMA and patent LAD, she has not had any further input evaluation in the absence of symptoms.  Currently denies any active anginal symptoms with rest or exertion => would not warrant any noninvasive ischemic evaluation, as it would not change my routine management. She is on stable dose of Toprol and losartan as well as on Plavix alone. She also takes high-dose Lipitor.

## 2015-02-23 ENCOUNTER — Encounter: Payer: Medicare Other | Admitting: Cardiothoracic Surgery

## 2015-02-24 ENCOUNTER — Encounter: Payer: Self-pay | Admitting: Cardiothoracic Surgery

## 2015-02-24 ENCOUNTER — Other Ambulatory Visit: Payer: Self-pay | Admitting: *Deleted

## 2015-02-24 ENCOUNTER — Ambulatory Visit (INDEPENDENT_AMBULATORY_CARE_PROVIDER_SITE_OTHER): Payer: Medicare Other | Admitting: Cardiothoracic Surgery

## 2015-02-24 VITALS — BP 121/65 | HR 48 | Resp 16 | Ht 63.5 in | Wt 152.0 lb

## 2015-02-24 DIAGNOSIS — R911 Solitary pulmonary nodule: Secondary | ICD-10-CM

## 2015-02-24 NOTE — Progress Notes (Signed)
[address]       [place] [address]             [phone]                    [name] Lyman Medical Record [id number] Date of Birth: [birth year]  Referring: Tanda Rockers, MD Primary Care: [name], MD  Chief Complaint:    Chief Complaint  Patient presents with  . Follow-up    after PFT and CARDIAC CLEARANCE    History of Present Illness:    [name] 74 y.o. female is seen in the office  today for evaluation of new discovery of left upper lobe lesion x2 found on chest xray done for cough and cold symptoms. Patient is distant smoker , for 7 years quit in 1960. Now has some SOB climbing stairs or hills but other wise with out complaints. Patient has history cad and visceral a aneurysms.    Patient seen by cardiology and prop recommendations made   Left mammary atretic lad ok on last cath  Current Activity/ Functional Status:  Patient is independent with mobility/ambulation, transfers, ADL's, IADL's.   Zubrod Score: At the time of surgery this patient's most appropriate activity status/level should be described as: []     0    Normal activity, no symptoms [x]     1    Restricted in physical strenuous activity but ambulatory, able to do out light work []     2    Ambulatory and capable of self care, unable to do work activities, up and about               >50 % of waking hours                              []     3    Only limited self care, in bed greater than 50% of waking hours []     4    Completely disabled, no self care, confined to bed or chair []     5    Moribund   Past Medical History  Diagnosis Date  . Hypothyroidism (acquired)     hypo  . Cervical disc disease   . Polymyalgia rheumatica (HCC)     2011 Dr. Charlestine Night  . Anemia   . Hyperlipidemia   . S/P CABG x 1 1996    CABG--LIMA-LAD for ostial LAD (not felt to be PCI amenable). EF NORMAL then; LIMA now atretic  . Abdominal aortic aneurysm (HCC)     REPAIRED IN  1993 BY [name]  AND HAS RECENTLY BEEN FOLLOWED BY [name]  . 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 Rheumatoid arthritis ([place]) 2011    Dr.Truslow; fracture knees, hands and wrists -   . CAD in native artery 1996, 2002, 2005     Status post CABG x1 with LIMA-LAD for ostial LAD 90% stenosis --> down to 50% in 2002 and 30% in 2005.;  Atretic LIMA; Myoview 06/2010: Fixed anteroseptal, apical and inferoapical defect with moderate size. Most likely scar. Mild subendocardial ischemia. EF 71% LOW RISK.   [name] COPD (chronic obstructive pulmonary disease) ([place])   recent work up for TIA in [place] - negative   Past Surgical History  Procedure Laterality Date  .  Splenectomy    . Cardiac catheterization  2005    (Most recent CATH) - ostial LAD lesion 20-30% (down from 90% initially). Atretic LIMA. Minimal disease the RCA and Circumflex system.  . Cervical spine surgery      plate 2008 Dr. Saintclair Halsted  . Carpal tunnel release    . Cataract extraction    . Hernia repair    . Bunionectomy  07/2011    right foot  . Coronary artery bypass graft  1996    INCLUDED AN INTERNAL MAMMARY ARTERY TO THE LAD. EF WAS NORMAL  . 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 Eye surgery    . Nm myoview ltd  March 2012    Fixed anteroseptal, apical and inferoapical defect with moderate size. Most likely scar. Mild subendocardial ischemia. EF 71% LOW RISK.     Family History  Problem Relation Age of Onset  . Hypertension    . Coronary artery disease    . Asthma Neg Hx   . Colon cancer Neg Hx   . Hypertension Mother   . Diabetes Mother   . Heart disease Mother   . Hyperlipidemia Mother   . Stroke Father   . Hyperlipidemia Sister   . Hypertension Sister   . Hypertension Daughter   . Cancer Paternal Uncle     Deceased from cancer not sure of  site  . Hypertension Son   . Hyperlipidemia Son   . Hyperlipidemia Son   . Hypertension Son   . Hyperlipidemia Son   . Hypertension Son     Social History   Social History  . Marital Status: Married    Spouse Name: N/A  . Number of Children: 4  . Years of Education: N/A   Occupational History  . retired    Social History Main Topics  . Smoking status: Former Smoker    Quit date: 12/04/1958  . Smokeless tobacco: Never Used  . Alcohol Use: 0.6 - 1.8 oz/week    1-3 Standard drinks or equivalent per week  . Drug Use: No  . Sexual Activity: Not Currently          Social History Narrative   [facility]  MARRIED -RETIRED ; [place]. GOES TO [place] 3 DAYS A WEEK FOR ABOUT 45 MIN. PER SESSION      Living at [facility] since dec 2015    History  Smoking status  . Former Smoker  . Quit date: 12/04/1958  Smokeless tobacco  . Never Used    History  Alcohol Use  . 0.6 - 1.8 oz/week  . 1-3 Standard drinks or equivalent per week     Allergies  Allergen Reactions  . Aspirin Other (See Comments)    Hurts stomach  . Codeine Nausea And Vomiting  . Nitroglycerin Other (See Comments)    Heart rate drops    Current Outpatient Prescriptions  Medication Sig Dispense Refill  . amoxicillin-clavulanate (AUGMENTIN) 875-125 MG tablet Take 1 tablet by mouth as needed.    [name] atorvastatin (LIPITOR) 80 MG tablet Take 1 tablet (80 mg total) by mouth daily. 90 tablet 3  . cetirizine (ZYRTEC) 10 MG tablet Take 10 mg by mouth daily.    . clopidogrel (PLAVIX) 75 MG tablet Take 1 tablet (75 mg total) by mouth daily. 90 tablet 3  . leflunomide (ARAVA) 20 MG tablet Take 1  tablet (20 mg total) by mouth every other day. 15 tablet 6  . levothyroxine (LEVOXYL) 88 MCG tablet Take 1 tablet (88 mcg total) by mouth daily. 90 tablet 3  . losartan (COZAAR) 25 MG tablet Take 1 tablet (25 mg total) by mouth daily. 90 tablet 3  . metoprolol succinate  (TOPROL-XL) 25 MG 24 hr tablet Take 1 tablet (25 mg total) by mouth daily. 90 tablet 3  . Probiotic Product (ALIGN) 4 MG CAPS Take 1 capsule by mouth daily. 30 capsule 1  . psyllium (METAMUCIL) 58.6 % packet Take 1 packet by mouth daily.     No current facility-administered medications for this visit.     Review of Systems:     Cardiac Review of Systems: Y or N  Chest Pain [ n   ]  Resting SOB [  n ] Exertional SOB  [n  ]  Orthopnea [  n]   Pedal Edema [  n ]    Palpitations n[  ] Syncope  [ n ]   Presyncope [  n ]  General Review of Systems: [Y] = yes [  ]=no Constitional: recent weight change [ n ];  Wt loss over the last 3 months [   ] anorexia [  ]; fatigue [  ]; nausea [  ]; night sweats [  ]; fever [  ]; or chills [  ];          Dental: poor dentition[  ]; Last Dentist visit:   Eye : blurred vision [  ]; diplopia [   ]; vision changes [  ];  Amaurosis fugax[  ]; Resp: cough [  ];  wheezing[  ];  hemoptysis[  ]; shortness of breath[  ]; paroxysmal nocturnal dyspnea[  ]; dyspnea on exertion[  ]; or orthopnea[  ];  GI:  gallstones[  ], vomiting[  ];  dysphagia[  ]; melena[  ];  hematochezia [  ]; heartburn[  ];   Hx of  Colonoscopy[  ]; GU: kidney stones [  ]; hematuria[  ];   dysuria [  ];  nocturia[  ];  history of     obstruction [  ]; urinary frequency [  ]             Skin: rash, swelling[  ];, hair loss[  ];  peripheral edema[  ];  or itching[  ]; Musculosketetal: myalgias[  ];  joint swelling[  ];  joint erythema[  ];  joint pain[  ];  back pain[  ];  Heme/Lymph: bruising[  ];  bleeding[  ];  anemia[  ];  Neuro: TIA[several months ago  ];  headaches[  ];  stroke[n  ];  vertigo[  ];  seizures[  ];   paresthesias[  ];  difficulty walking[  ];  Psych:depression[  ]; anxiety[  ];  Endocrine: diabetes[  ];  thyroid dysfunction[  ];  Immunizations: Flu up to date [  ]; Pneumococcal up to date [  ];  Other:  Physical Exam: BP 121/65 mmHg  Pulse 48  Resp 16  Ht 5' 3.5" (1.613 m)   Wt 152 lb (68.947 kg)  BMI 26.50 kg/m2  SpO2 97%  PHYSICAL EXAMINATION: General appearance: alert, cooperative and no distress Head: Normocephalic, without obvious abnormality, atraumatic Neck: no adenopathy, no carotid bruit, no JVD, supple, symmetrical, trachea midline and thyroid not enlarged, symmetric, no tenderness/mass/nodules Lymph nodes: Cervical, supraclavicular, and axillary nodes normal. Resp: clear to auscultation bilaterally Back: symmetric, no  curvature. ROM normal. No CVA tenderness. Cardio: regular rate and rhythm, S1, S2 normal, no murmur, click, rub or gallop GI: soft, non-tender; bowel sounds normal; no masses,  no organomegaly Extremities: extremities normal, atraumatic, no cyanosis or edema and Homans sign is negative, no sign of DVT Neurologic: Grossly normal  Diagnostic Studies & Laboratory data:     Recent Radiology Findings:   Dg Chest 2 View  01/29/2015  CLINICAL DATA:  Seven day history of cough EXAM: CHEST  2 VIEW COMPARISON:  None. FINDINGS: Lungs are somewhat hyperexpanded. There is no edema or consolidation. On the lateral view, there is a nodular opacity measuring 1.5 x 1.4 cm at the level of the anterior upper thoracic spine. This opacity is not seen on the frontal view. Patient is status post internal mammary bypass grafting. Heart size and pulmonary vascularity are within normal limits. No adenopathy. No bone lesions. IMPRESSION: 1.5 x 1.4 cm nodular opacity at the level of the anterior upper thoracic spine seen on the lateral view. This finding warrants noncontrast enhanced chest CT to further assess. Lungs are hyperexpanded.  No edema or consolidation. These results will be called to the ordering clinician or representative by the Radiologist Assistant, and communication documented in the PACS or zVision Dashboard. Electronically Signed   By: Lowella Grip III M.D.   On: 01/29/2015 14:41   Ct Chest Wo Contrast  01/29/2015  CLINICAL DATA:  74 year old  female with nodule identified on chest x-ray today. EXAM: CT CHEST WITHOUT CONTRAST TECHNIQUE: Multidetector CT imaging of the chest was performed following the standard protocol without IV contrast. COMPARISON:  01/29/2015 and prior chest radiographs. FINDINGS: Mediastinum/Nodes: Cardiac surgical changes are identified. Aortic atherosclerotic calcifications are present without thoracic aortic aneurysm. There is no evidence of pericardial effusion or enlarged lymph nodes. Lungs/Pleura: A 1.5 x 1.6 cm left upper lobe nodule (image 20) with tiny focus of internal gas is noted. A 7 mm left upper lobe nodule (image 28) is also identified. There is no evidence of airspace disease, consolidation, endobronchial lesion or pleural effusion. Upper abdomen: No acute abnormalities identified. Musculoskeletal: Sclerosis of a lower thoracic vertebra (probably T11). No other acute or suspicious bony abnormalities are identified. IMPRESSION: 1.5 x 1.6 cm left upper lobe nodule and 7 mm left upper lobe nodule, suspicious for malignancy/metastatic disease. Recommend thoracic surgery consultation or PET-CT in the absence of infectious symptoms. Sclerosis of the lower thoracic vertebral body (likely T11) -metastatic disease is not excluded. PET-CT may be helpful for further evaluation as well. Electronically Signed   By: Margarette Canada M.D.   On: 01/29/2015 17:50   Nm Pet Image Initial (pi) Skull Base To Thigh  02/10/2015  CLINICAL DATA:  Initial treatment strategy for pulmonary nodule. Irregular LEFT upper lobe pulmonary nodule. EXAM: NUCLEAR MEDICINE PET SKULL BASE TO THIGH TECHNIQUE: 7.8 mCi F-18 FDG was injected intravenously. Full-ring PET imaging was performed from the skull base to thigh after the radiotracer. CT data was obtained and used for attenuation correction and anatomic localization. FASTING BLOOD GLUCOSE:  Value: 80 mg/dl COMPARISON:  CT 01/29/2015 FINDINGS: NECK No hypermetabolic lymph nodes in the neck. CHEST 16 mm  hypermetabolic nodule in the LEFT upper lobe with SUV max equal 4.8 (image 50 fused data set). There is mild atelectasis at lung bases. No additional pulmonary nodules No hypermetabolic mediastinal or supraclavicular lymph nodes. ABDOMEN/PELVIS No abnormal hypermetabolic activity within the liver, pancreas, adrenal glands, or spleen. No hypermetabolic lymph nodes in the abdomen or pelvis. Low-density  lesion in the LEFT kidney without metabolic activity. New SKELETON Intense uptake at the facet joint at L5-S1 on the RIGHT which is felt to be degenerative in nature. Sclerotic T11 vertebral body does not have metabolic activity is most consistent with posttraumatic injury. IMPRESSION: 1. Hypermetabolic LEFT upper lobe pulmonary nodule is concerning for primary bronchogenic carcinoma. 2. Staging by FDG PET imaging T1a N0 M0 3. No evidence of mediastinal nodal metastasis or distant metastasis. Electronically Signed   By: Suzy Bouchard M.D.   On: 02/10/2015 15:18     I have independently reviewed the above radiologic studies.  Recent Lab Findings: Lab Results  Component Value Date   WBC 7.0 01/29/2015   HGB 12.9 01/29/2015   HCT 40.3 01/29/2015   PLT 347.0 11/26/2014   GLUCOSE 87 11/26/2014   CHOL 168 11/26/2014   TRIG 111.0 11/26/2014   HDL 75.10 11/26/2014   LDLDIRECT 162.1 10/28/2009   LDLCALC 71 11/26/2014   ALT 34 11/26/2014   AST 29 11/26/2014   NA 140 11/26/2014   K 4.5 11/26/2014   CL 105 11/26/2014   CREATININE 1.37* 11/26/2014   BUN 30* 11/26/2014   CO2 29 11/26/2014   TSH 0.93 11/26/2014   INR 1.0 11/29/2006    Pulmonary Function Diagnosis: Minimal Obstructive Airways Disease with reversibility Mild Diffusion Defect PFT's done  FEV1 1.58  80% up to 1.78 90% with bronchodilators DLCO 16.27   22.91 71%  Assessment / Plan:   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 I have recommended proceeding with surgical resection.  Patient  cleared by cardiology With history of cabg 1996 and known abdominal vascular disease.  Patient on Plavix to stop 5 days preop Plan bronchoscopy left vats poss left upper lobectomy in patent with previous CABG and takedown of lima.Plan for NOV2 Risks and options discussed in detail with patient and husband    Grace Isaac MD      [address] [place],[address] [address] Office [phone]   Beeper [phone]  02/24/2015 1:28 PM

## 2015-02-25 ENCOUNTER — Other Ambulatory Visit: Payer: Self-pay | Admitting: *Deleted

## 2015-02-26 ENCOUNTER — Encounter (HOSPITAL_COMMUNITY)
Admission: RE | Admit: 2015-02-26 | Discharge: 2015-02-26 | Disposition: A | Discharge status: Home / Self Care | Payer: Medicare Other | Source: Ambulatory Visit | Attending: Cardiothoracic Surgery | Admitting: Cardiothoracic Surgery

## 2015-02-26 ENCOUNTER — Encounter (HOSPITAL_COMMUNITY): Payer: Self-pay

## 2015-02-26 ENCOUNTER — Other Ambulatory Visit (HOSPITAL_COMMUNITY): Payer: Self-pay | Admitting: *Deleted

## 2015-02-26 ENCOUNTER — Ambulatory Visit (HOSPITAL_COMMUNITY)
Admission: RE | Admit: 2015-02-26 | Discharge: 2015-02-26 | Disposition: A | Discharge status: Home / Self Care | Payer: Medicare Other | Source: Ambulatory Visit | Attending: Cardiothoracic Surgery | Admitting: Cardiothoracic Surgery

## 2015-02-26 VITALS — BP 124/60 | HR 78 | Temp 97.7°F | Resp 16 | Ht 63.5 in | Wt 155.9 lb

## 2015-02-26 DIAGNOSIS — Z0183 Encounter for blood typing: Secondary | ICD-10-CM | POA: Diagnosis not present

## 2015-02-26 DIAGNOSIS — Z01812 Encounter for preprocedural laboratory examination: Secondary | ICD-10-CM | POA: Insufficient documentation

## 2015-02-26 DIAGNOSIS — Z01818 Encounter for other preprocedural examination: Secondary | ICD-10-CM | POA: Insufficient documentation

## 2015-02-26 DIAGNOSIS — R911 Solitary pulmonary nodule: Secondary | ICD-10-CM | POA: Insufficient documentation

## 2015-02-26 HISTORY — DX: Reserved for inherently not codable concepts without codable children: IMO0001

## 2015-02-26 HISTORY — DX: Frequency of micturition: R35.0

## 2015-02-26 HISTORY — DX: Cerebral infarction, unspecified: I63.9

## 2015-02-26 HISTORY — DX: Chronic kidney disease, unspecified: N18.9

## 2015-02-26 LAB — BLOOD GAS, ARTERIAL
Acid-base deficit: 1.8 mmol/L (ref 0.0–2.0)
Bicarbonate: 22.2 mEq/L (ref 20.0–24.0)
Drawn by: 42180
FIO2: 0.21
O2 Saturation: 97.1 %
Patient temperature: 98.6
TCO2: 23.3 mmol/L (ref 0–100)
pCO2 arterial: 36.1 mmHg (ref 35.0–45.0)
pH, Arterial: 7.405 (ref 7.350–7.450)
pO2, Arterial: 97.9 mmHg (ref 80.0–100.0)

## 2015-02-26 LAB — URINALYSIS, ROUTINE W REFLEX MICROSCOPIC
Bilirubin Urine: NEGATIVE
Glucose, UA: NEGATIVE mg/dL
Hgb urine dipstick: NEGATIVE
Ketones, ur: NEGATIVE mg/dL
Leukocytes, UA: NEGATIVE
Nitrite: NEGATIVE
Protein, ur: NEGATIVE mg/dL
Specific Gravity, Urine: 1.007 (ref 1.005–1.030)
Urobilinogen, UA: 0.2 mg/dL (ref 0.0–1.0)
pH: 5.5 (ref 5.0–8.0)

## 2015-02-26 LAB — COMPREHENSIVE METABOLIC PANEL
ALT: 24 U/L (ref 14–54)
AST: 27 U/L (ref 15–41)
Albumin: 3.8 g/dL (ref 3.5–5.0)
Alkaline Phosphatase: 120 U/L (ref 38–126)
Anion gap: 10 (ref 5–15)
BUN: 29 mg/dL — ABNORMAL HIGH (ref 6–20)
CO2: 21 mmol/L — ABNORMAL LOW (ref 22–32)
Calcium: 9.6 mg/dL (ref 8.9–10.3)
Chloride: 104 mmol/L (ref 101–111)
Creatinine, Ser: 1.26 mg/dL — ABNORMAL HIGH (ref 0.44–1.00)
GFR calc Af Amer: 47 mL/min — ABNORMAL LOW (ref >60)
GFR calc non Af Amer: 40 mL/min — ABNORMAL LOW (ref >60)
Glucose, Bld: 88 mg/dL (ref 65–99)
Potassium: 4.8 mmol/L (ref 3.5–5.1)
Sodium: 135 mmol/L (ref 135–145)
Total Bilirubin: 0.7 mg/dL (ref 0.3–1.2)
Total Protein: 6.7 g/dL (ref 6.5–8.1)

## 2015-02-26 LAB — PROTIME-INR
INR: 1 (ref 0.00–1.49)
Prothrombin Time: 13.4 seconds (ref 11.6–15.2)

## 2015-02-26 LAB — CBC
HCT: 36.3 % (ref 36.0–46.0)
Hemoglobin: 12 g/dL (ref 12.0–15.0)
MCH: 30.7 pg (ref 26.0–34.0)
MCHC: 33.1 g/dL (ref 30.0–36.0)
MCV: 92.8 fL (ref 78.0–100.0)
Platelets: 319 10*3/uL (ref 150–400)
RBC: 3.91 MIL/uL (ref 3.87–5.11)
RDW: 15.6 % — ABNORMAL HIGH (ref 11.5–15.5)
WBC: 7 10*3/uL (ref 4.0–10.5)

## 2015-02-26 LAB — SURGICAL PCR SCREEN
MRSA, PCR: NEGATIVE
Staphylococcus aureus: NEGATIVE

## 2015-02-26 LAB — TYPE AND SCREEN
ABO/RH(D): O POS
Antibody Screen: NEGATIVE

## 2015-02-26 LAB — APTT: aPTT: 28 seconds (ref 24–37)

## 2015-02-26 NOTE — Pre-Procedure Instructions (Addendum)
[name]  02/26/2015      [facility], [place] [address] [name] [address] Phone: [phone] Fax: [phone]  [place], [place] [address] [place] [address] [place] [address] Phone: [phone] Fax: [phone]  [id number] [place], [address] [place] [address] Phone: [phone] Fax: [phone]    Your procedure is scheduled on Tuesday November 1,2016   Report to [facility] Entrance "A" Admitting Office at 5:30 AM.   Call this number if you have problems the morning of surgery: [phone]   Any questions prior to day of surgery, please call [phone] between 8 & 4 PM.   Remember:  Do not eat food or drink liquids after midnight Tuesday, 03/02/15.   Take these medicines the morning of surgery with A SIP OF WATER: Levothyroxine (Levoxyl), Metoprolol (Toprol XL)   Do not wear jewelry, make-up or nail polish.  Do not wear lotions, powders, or perfumes.  You may NOT wear deodorant.  Do not shave 48 hours prior to surgery.    Do not bring valuables to the hospital.  [facility] is not responsible for any belongings or valuables.  Contacts, dentures or bridgework may not be worn into surgery.  Leave your suitcase in the car.  After surgery it may be brought to your room.  For patients admitted to the hospital, discharge time will be determined by your treatment team.  Special instructions:  [place] - Preparing for Surgery  Before surgery, you can play an important role.  Because skin is not sterile, your skin needs to be as free of germs as possible.  You can reduce the number of germs on you skin by washing with CHG (chlorahexidine gluconate) soap before surgery.  CHG is an antiseptic cleaner which kills germs and bonds with the skin to continue killing germs even after  washing.  Please DO NOT use if you have an allergy to CHG or antibacterial soaps.  If your skin becomes reddened/irritated stop using the CHG and inform your nurse when you arrive at Short Stay.  Do not shave (including legs and underarms) for at least 48 hours prior to the first CHG shower.  You may shave your face.  Please follow these instructions carefully:   1.  Shower with CHG Soap the night before surgery and the morning of Surgery.  2.  If you choose to wash your hair, wash your hair first as usual with your  normal shampoo.  3.  After you shampoo, rinse your hair and body thoroughly to remove the   Shampoo.  4.  Use CHG as you would any other liquid soap.  You can apply chg directly to the skin and wash gently with scrungie or a clean washcloth.  5.  Apply the CHG Soap to your body ONLY FROM THE NECK DOWN.        Do not use on open wounds or open sores.  Avoid contact with your eyes, ears, mouth and genitals (private parts).  Wash genitals (private parts) with your normal soap.  6.  Wash thoroughly, paying special attention to the area where your surgery   will be performed.  7.  Thoroughly rinse your body with warm water from the neck down.  8.  DO NOT shower/wash with your normal soap after using and rinsing off  the CHG Soap.  9.  Pat yourself dry with a clean towel.            10.  Wear clean pajamas.            11.  Place clean sheets on your bed the night of your first shower and do not  sleep with pets.  Day of Surgery  Do not apply any lotions/deodorants the morning of surgery.  Please wear clean clothes to the hospital.  Please read over the following fact sheets that you were given. Pain Booklet, Coughing and Deep Breathing, Blood Transfusion Information, MRSA Information and Surgical Site Infection Prevention

## 2015-03-01 MED ORDER — DEXTROSE 5 % IV SOLN
1.5000 g | INTRAVENOUS | Status: AC
  Administered 2015-03-02: 1.5 g via INTRAVENOUS
  Filled 2015-03-01 (×2): qty 1.5

## 2015-03-01 NOTE — Progress Notes (Addendum)
Title of Study: PATHOLOGY PROCUREMENT   Description: Customer service manager for the Discovery and Validation of Biomarkers for the Prediction, Diagnosis and Management of Disease   Principal Investigator: Enid Cutter, MD office [phone] Study Coordinator: Rodena Goldmann cell [phone] IRB #: [phone] Met with [name] for 15 minutes to review IRB# [id number]. 1 family member present (husband [name]). The patient is eligible and qualifies for this study. Reviewed the consent/HIPAA form in detail and explained the purpose of the study, study procedures, potential risks, potential benefits, and alternatives to participation. All of the patient's questions were answered. The patient agreed to take part in the study and signed the consent/HIPAA document. Enrollment procedures completed. The patient was given a copy of the signed informed consent. Consent form loaded in Media Tab (03/02/2015 Procedure Note - [email or web address].pdf)  Request left with OR Controller desk to please deliver surgical specimen to Histology [facility], no formalin.  Leftover tissue obtained by PA for research purposes. Site: PENDING; Gross Measurement: PENDING ; Weight: PENDING   (02-Mar-2015)  Rodena Goldmann  Pathology - Clinical Research  Cell [phone]

## 2015-03-02 ENCOUNTER — Encounter (HOSPITAL_COMMUNITY): Payer: Self-pay | Admitting: *Deleted

## 2015-03-02 ENCOUNTER — Inpatient Hospital Stay (HOSPITAL_COMMUNITY)
Admission: RE | Admit: 2015-03-02 | Discharge: 2015-03-07 | DRG: 164 | Disposition: A | Discharge status: Home / Self Care | Payer: Medicare Other | Source: Ambulatory Visit | Attending: Cardiothoracic Surgery | Admitting: Cardiothoracic Surgery

## 2015-03-02 ENCOUNTER — Inpatient Hospital Stay (HOSPITAL_COMMUNITY): Payer: Medicare Other | Admitting: Anesthesiology

## 2015-03-02 ENCOUNTER — Encounter (HOSPITAL_COMMUNITY)
Admission: RE | Disposition: A | Discharge status: Home / Self Care | Payer: Self-pay | Source: Ambulatory Visit | Attending: Cardiothoracic Surgery

## 2015-03-02 ENCOUNTER — Encounter: Payer: Self-pay | Admitting: Anatomic Pathology & Clinical Pathology

## 2015-03-02 ENCOUNTER — Inpatient Hospital Stay (HOSPITAL_COMMUNITY): Payer: Medicare Other

## 2015-03-02 DIAGNOSIS — M069 Rheumatoid arthritis, unspecified: Secondary | ICD-10-CM | POA: Diagnosis present

## 2015-03-02 DIAGNOSIS — E039 Hypothyroidism, unspecified: Secondary | ICD-10-CM | POA: Diagnosis present

## 2015-03-02 DIAGNOSIS — Z4682 Encounter for fitting and adjustment of non-vascular catheter: Secondary | ICD-10-CM

## 2015-03-02 DIAGNOSIS — Z8673 Personal history of transient ischemic attack (TIA), and cerebral infarction without residual deficits: Secondary | ICD-10-CM

## 2015-03-02 DIAGNOSIS — Z888 Allergy status to other drugs, medicaments and biological substances status: Secondary | ICD-10-CM | POA: Diagnosis not present

## 2015-03-02 DIAGNOSIS — E785 Hyperlipidemia, unspecified: Secondary | ICD-10-CM | POA: Diagnosis present

## 2015-03-02 DIAGNOSIS — D649 Anemia, unspecified: Secondary | ICD-10-CM | POA: Diagnosis present

## 2015-03-02 DIAGNOSIS — Z886 Allergy status to analgesic agent status: Secondary | ICD-10-CM | POA: Diagnosis not present

## 2015-03-02 DIAGNOSIS — Z885 Allergy status to narcotic agent status: Secondary | ICD-10-CM

## 2015-03-02 DIAGNOSIS — J9382 Other air leak: Secondary | ICD-10-CM | POA: Diagnosis not present

## 2015-03-02 DIAGNOSIS — J449 Chronic obstructive pulmonary disease, unspecified: Secondary | ICD-10-CM | POA: Diagnosis present

## 2015-03-02 DIAGNOSIS — I10 Essential (primary) hypertension: Secondary | ICD-10-CM | POA: Diagnosis present

## 2015-03-02 DIAGNOSIS — Z833 Family history of diabetes mellitus: Secondary | ICD-10-CM

## 2015-03-02 DIAGNOSIS — J939 Pneumothorax, unspecified: Secondary | ICD-10-CM

## 2015-03-02 DIAGNOSIS — Z8249 Family history of ischemic heart disease and other diseases of the circulatory system: Secondary | ICD-10-CM | POA: Diagnosis not present

## 2015-03-02 DIAGNOSIS — Z87891 Personal history of nicotine dependence: Secondary | ICD-10-CM | POA: Diagnosis not present

## 2015-03-02 DIAGNOSIS — I2510 Atherosclerotic heart disease of native coronary artery without angina pectoris: Secondary | ICD-10-CM | POA: Diagnosis present

## 2015-03-02 DIAGNOSIS — J8410 Pulmonary fibrosis, unspecified: Secondary | ICD-10-CM | POA: Diagnosis present

## 2015-03-02 DIAGNOSIS — R911 Solitary pulmonary nodule: Secondary | ICD-10-CM

## 2015-03-02 DIAGNOSIS — R918 Other nonspecific abnormal finding of lung field: Secondary | ICD-10-CM | POA: Diagnosis present

## 2015-03-02 DIAGNOSIS — H3530 Unspecified macular degeneration: Secondary | ICD-10-CM | POA: Diagnosis present

## 2015-03-02 DIAGNOSIS — Z9081 Acquired absence of spleen: Secondary | ICD-10-CM | POA: Diagnosis not present

## 2015-03-02 DIAGNOSIS — M353 Polymyalgia rheumatica: Secondary | ICD-10-CM | POA: Diagnosis present

## 2015-03-02 DIAGNOSIS — Z823 Family history of stroke: Secondary | ICD-10-CM | POA: Diagnosis not present

## 2015-03-02 DIAGNOSIS — Z902 Acquired absence of lung [part of]: Secondary | ICD-10-CM

## 2015-03-02 DIAGNOSIS — Z951 Presence of aortocoronary bypass graft: Secondary | ICD-10-CM

## 2015-03-02 DIAGNOSIS — Z9689 Presence of other specified functional implants: Secondary | ICD-10-CM

## 2015-03-02 HISTORY — PX: VIDEO BRONCHOSCOPY: SHX5072

## 2015-03-02 HISTORY — PX: VIDEO ASSISTED THORACOSCOPY (VATS)/WEDGE RESECTION: SHX6174

## 2015-03-02 LAB — GLUCOSE, CAPILLARY
Glucose-Capillary: 140 mg/dL — ABNORMAL HIGH (ref 65–99)
Glucose-Capillary: 176 mg/dL — ABNORMAL HIGH (ref 65–99)

## 2015-03-02 SURGERY — BRONCHOSCOPY, VIDEO-ASSISTED
Anesthesia: General | Site: Chest

## 2015-03-02 MED ORDER — PANTOPRAZOLE SODIUM 40 MG PO TBEC
40.0000 mg | DELAYED_RELEASE_TABLET | Freq: Every day | ORAL | Status: DC
  Administered 2015-03-02 – 2015-03-07 (×6): 40 mg via ORAL
  Filled 2015-03-02 (×6): qty 1

## 2015-03-02 MED ORDER — MIDAZOLAM HCL 5 MG/5ML IJ SOLN
INTRAMUSCULAR | Status: DC | PRN
  Administered 2015-03-02 (×2): 0.5 mg via INTRAVENOUS
  Administered 2015-03-02: 1 mg via INTRAVENOUS

## 2015-03-02 MED ORDER — POTASSIUM CHLORIDE 10 MEQ/50ML IV SOLN: 10.0000 meq | Freq: Every day | INTRAVENOUS | Status: DC | PRN

## 2015-03-02 MED ORDER — FENTANYL CITRATE (PF) 250 MCG/5ML IJ SOLN
INTRAMUSCULAR | Status: AC
  Filled 2015-03-02: qty 5

## 2015-03-02 MED ORDER — PROPOFOL 10 MG/ML IV BOLUS
INTRAVENOUS | Status: AC
  Filled 2015-03-02: qty 20

## 2015-03-02 MED ORDER — PROPOFOL 10 MG/ML IV BOLUS
INTRAVENOUS | Status: DC | PRN
  Administered 2015-03-02: 160 mg via INTRAVENOUS

## 2015-03-02 MED ORDER — 0.9 % SODIUM CHLORIDE (POUR BTL) OPTIME
TOPICAL | Status: DC | PRN
  Administered 2015-03-02: 2000 mL

## 2015-03-02 MED ORDER — DEXAMETHASONE SODIUM PHOSPHATE 4 MG/ML IJ SOLN
INTRAMUSCULAR | Status: AC
  Filled 2015-03-02: qty 1

## 2015-03-02 MED ORDER — ALBUMIN HUMAN 5 % IV SOLN
INTRAVENOUS | Status: AC
  Administered 2015-03-02: 12.5 g
  Filled 2015-03-02: qty 250

## 2015-03-02 MED ORDER — ROCURONIUM BROMIDE 50 MG/5ML IV SOLN
INTRAVENOUS | Status: AC
  Filled 2015-03-02: qty 2

## 2015-03-02 MED ORDER — SODIUM CHLORIDE 0.9 % IV SOLN
INTRAVENOUS | Status: DC
  Administered 2015-03-02 – 2015-03-03 (×2): via INTRAVENOUS

## 2015-03-02 MED ORDER — STERILE WATER FOR INJECTION IJ SOLN
INTRAMUSCULAR | Status: AC
  Filled 2015-03-02: qty 10

## 2015-03-02 MED ORDER — PHENYLEPHRINE HCL 10 MG/ML IJ SOLN
10.0000 mg | INTRAVENOUS | Status: DC | PRN
  Administered 2015-03-02: 20 ug/min via INTRAVENOUS

## 2015-03-02 MED ORDER — ONDANSETRON HCL 4 MG/2ML IJ SOLN: 4.0000 mg | Freq: Four times a day (QID) | INTRAMUSCULAR | Status: DC | PRN

## 2015-03-02 MED ORDER — ASPIRIN EC 81 MG PO TBEC
81.0000 mg | DELAYED_RELEASE_TABLET | Freq: Every day | ORAL | Status: DC
  Administered 2015-03-03 – 2015-03-07 (×5): 81 mg via ORAL
  Filled 2015-03-02 (×6): qty 1

## 2015-03-02 MED ORDER — DIPHENHYDRAMINE HCL 50 MG/ML IJ SOLN: 12.5000 mg | Freq: Four times a day (QID) | INTRAMUSCULAR | Status: DC | PRN

## 2015-03-02 MED ORDER — HYDROMORPHONE HCL 1 MG/ML IJ SOLN
INTRAMUSCULAR | Status: AC
  Filled 2015-03-02: qty 1

## 2015-03-02 MED ORDER — METOPROLOL SUCCINATE ER 25 MG PO TB24
25.0000 mg | ORAL_TABLET | Freq: Every day | ORAL | Status: DC
  Administered 2015-03-03 – 2015-03-07 (×5): 25 mg via ORAL
  Filled 2015-03-02 (×6): qty 1

## 2015-03-02 MED ORDER — DEXTROSE 5 % IV SOLN
1.5000 g | Freq: Two times a day (BID) | INTRAVENOUS | Status: AC
  Administered 2015-03-02 – 2015-03-03 (×2): 1.5 g via INTRAVENOUS
  Filled 2015-03-02 (×2): qty 1.5

## 2015-03-02 MED ORDER — BUPIVACAINE ON-Q PAIN PUMP (FOR ORDER SET NO CHG)
INJECTION | Status: DC
  Filled 2015-03-02: qty 1

## 2015-03-02 MED ORDER — MIDAZOLAM HCL 2 MG/2ML IJ SOLN
INTRAMUSCULAR | Status: AC
  Filled 2015-03-02: qty 4

## 2015-03-02 MED ORDER — DEXAMETHASONE SODIUM PHOSPHATE 4 MG/ML IJ SOLN
INTRAMUSCULAR | Status: DC | PRN
  Administered 2015-03-02: 4 mg via INTRAVENOUS

## 2015-03-02 MED ORDER — ONDANSETRON HCL 4 MG/2ML IJ SOLN
4.0000 mg | Freq: Four times a day (QID) | INTRAMUSCULAR | Status: DC | PRN
  Administered 2015-03-02: 4 mg via INTRAVENOUS
  Filled 2015-03-02: qty 2

## 2015-03-02 MED ORDER — FENTANYL CITRATE (PF) 100 MCG/2ML IJ SOLN
INTRAMUSCULAR | Status: DC | PRN
  Administered 2015-03-02 (×2): 50 ug via INTRAVENOUS
  Administered 2015-03-02: 100 ug via INTRAVENOUS
  Administered 2015-03-02: 25 ug via INTRAVENOUS
  Administered 2015-03-02: 100 ug via INTRAVENOUS
  Administered 2015-03-02 (×2): 25 ug via INTRAVENOUS
  Administered 2015-03-02: 100 ug via INTRAVENOUS
  Administered 2015-03-02: 50 ug via INTRAVENOUS
  Administered 2015-03-02: 75 ug via INTRAVENOUS
  Administered 2015-03-02: 100 ug via INTRAVENOUS

## 2015-03-02 MED ORDER — DEXTROSE-NACL 5-0.9 % IV SOLN
INTRAVENOUS | Status: DC
  Administered 2015-03-02: 15:00:00 via INTRAVENOUS

## 2015-03-02 MED ORDER — BUPIVACAINE HCL (PF) 0.5 % IJ SOLN
INTRAMUSCULAR | Status: AC
  Filled 2015-03-02: qty 10

## 2015-03-02 MED ORDER — BUPIVACAINE HCL (PF) 0.5 % IJ SOLN
INTRAMUSCULAR | Status: DC | PRN
  Administered 2015-03-02: 10 mL

## 2015-03-02 MED ORDER — ATORVASTATIN CALCIUM 80 MG PO TABS
80.0000 mg | ORAL_TABLET | Freq: Every day | ORAL | Status: DC
  Administered 2015-03-02 – 2015-03-07 (×6): 80 mg via ORAL
  Filled 2015-03-02 (×7): qty 1

## 2015-03-02 MED ORDER — SUCCINYLCHOLINE CHLORIDE 20 MG/ML IJ SOLN
INTRAMUSCULAR | Status: DC | PRN
  Administered 2015-03-02: 140 mg via INTRAVENOUS

## 2015-03-02 MED ORDER — DIPHENHYDRAMINE HCL 12.5 MG/5ML PO ELIX: 12.5000 mg | ORAL_SOLUTION | Freq: Four times a day (QID) | ORAL | Status: DC | PRN

## 2015-03-02 MED ORDER — SUGAMMADEX SODIUM 200 MG/2ML IV SOLN
INTRAVENOUS | Status: DC | PRN
  Administered 2015-03-02: 200 mg via INTRAVENOUS

## 2015-03-02 MED ORDER — ACETAMINOPHEN 160 MG/5ML PO SOLN: 1000.0000 mg | Freq: Four times a day (QID) | ORAL | Status: DC

## 2015-03-02 MED ORDER — PSYLLIUM 95 % PO PACK
1.0000 | PACK | Freq: Every day | ORAL | Status: DC
  Administered 2015-03-02 – 2015-03-07 (×6): 1 via ORAL
  Filled 2015-03-02 (×7): qty 1

## 2015-03-02 MED ORDER — PHENYLEPHRINE HCL 10 MG/ML IJ SOLN
INTRAMUSCULAR | Status: DC | PRN
  Administered 2015-03-02: 80 ug via INTRAVENOUS

## 2015-03-02 MED ORDER — LORATADINE 10 MG PO TABS
10.0000 mg | ORAL_TABLET | Freq: Every day | ORAL | Status: DC
  Administered 2015-03-03 – 2015-03-07 (×5): 10 mg via ORAL
  Filled 2015-03-02 (×6): qty 1

## 2015-03-02 MED ORDER — LEVOTHYROXINE SODIUM 88 MCG PO TABS
88.0000 ug | ORAL_TABLET | Freq: Every day | ORAL | Status: DC
  Administered 2015-03-03 – 2015-03-07 (×5): 88 ug via ORAL
  Filled 2015-03-02 (×7): qty 1

## 2015-03-02 MED ORDER — ALIGN 4 MG PO CAPS: 1.0000 | ORAL_CAPSULE | Freq: Every day | ORAL | Status: DC

## 2015-03-02 MED ORDER — TRAMADOL HCL 50 MG PO TABS: 50.0000 mg | ORAL_TABLET | Freq: Four times a day (QID) | ORAL | Status: DC | PRN

## 2015-03-02 MED ORDER — SODIUM CHLORIDE 0.9 % IJ SOLN: 9.0000 mL | INTRAMUSCULAR | Status: DC | PRN

## 2015-03-02 MED ORDER — SUGAMMADEX SODIUM 200 MG/2ML IV SOLN
INTRAVENOUS | Status: AC
  Filled 2015-03-02: qty 2

## 2015-03-02 MED ORDER — INSULIN ASPART 100 UNIT/ML ~~LOC~~ SOLN
0.0000 [IU] | Freq: Four times a day (QID) | SUBCUTANEOUS | Status: DC
  Administered 2015-03-02: 4 [IU] via SUBCUTANEOUS
  Administered 2015-03-02 – 2015-03-04 (×3): 2 [IU] via SUBCUTANEOUS
  Administered 2015-03-05: 4 [IU] via SUBCUTANEOUS

## 2015-03-02 MED ORDER — BUPIVACAINE 0.5 % ON-Q PUMP SINGLE CATH 400 ML
400.0000 mL | INJECTION | Status: DC
  Administered 2015-03-02: 400 mL
  Filled 2015-03-02: qty 400

## 2015-03-02 MED ORDER — PROMETHAZINE HCL 25 MG/ML IJ SOLN: 6.2500 mg | INTRAMUSCULAR | Status: DC | PRN

## 2015-03-02 MED ORDER — ACETAMINOPHEN 500 MG PO TABS
1000.0000 mg | ORAL_TABLET | Freq: Four times a day (QID) | ORAL | Status: DC
  Administered 2015-03-02 – 2015-03-07 (×16): 1000 mg via ORAL
  Filled 2015-03-02 (×24): qty 2

## 2015-03-02 MED ORDER — LACTATED RINGERS IV SOLN
INTRAVENOUS | Status: DC | PRN
  Administered 2015-03-02: 07:00:00 via INTRAVENOUS

## 2015-03-02 MED ORDER — SENNOSIDES-DOCUSATE SODIUM 8.6-50 MG PO TABS
1.0000 | ORAL_TABLET | Freq: Every day | ORAL | Status: DC
Start: 2015-03-02 — End: 2015-03-07
  Administered 2015-03-02 – 2015-03-05 (×2): 1 via ORAL
  Filled 2015-03-02 (×6): qty 1

## 2015-03-02 MED ORDER — SUCCINYLCHOLINE CHLORIDE 20 MG/ML IJ SOLN
INTRAMUSCULAR | Status: AC
  Filled 2015-03-02: qty 1

## 2015-03-02 MED ORDER — ROCURONIUM BROMIDE 100 MG/10ML IV SOLN
INTRAVENOUS | Status: DC | PRN
  Administered 2015-03-02 (×2): 50 mg via INTRAVENOUS

## 2015-03-02 MED ORDER — BISACODYL 5 MG PO TBEC
10.0000 mg | DELAYED_RELEASE_TABLET | Freq: Every day | ORAL | Status: DC
  Administered 2015-03-02 – 2015-03-03 (×2): 10 mg via ORAL
  Filled 2015-03-02 (×3): qty 2

## 2015-03-02 MED ORDER — ONDANSETRON HCL 4 MG/2ML IJ SOLN
INTRAMUSCULAR | Status: DC | PRN
  Administered 2015-03-02: 4 mg via INTRAVENOUS

## 2015-03-02 MED ORDER — EPHEDRINE SULFATE 50 MG/ML IJ SOLN
INTRAMUSCULAR | Status: AC
  Filled 2015-03-02: qty 1

## 2015-03-02 MED ORDER — HYDROMORPHONE HCL 1 MG/ML IJ SOLN
0.2500 mg | INTRAMUSCULAR | Status: DC | PRN
  Administered 2015-03-02: 0.5 mg via INTRAVENOUS

## 2015-03-02 MED ORDER — ALBUMIN HUMAN 5 % IV SOLN
12.5000 g | Freq: Once | INTRAVENOUS | Status: AC
  Administered 2015-03-02: 12.5 g via INTRAVENOUS

## 2015-03-02 MED ORDER — FENTANYL 40 MCG/ML IV SOLN
INTRAVENOUS | Status: DC
  Administered 2015-03-02: 13:00:00 via INTRAVENOUS
  Administered 2015-03-02: 10 ug via INTRAVENOUS
  Administered 2015-03-02: 70 ug via INTRAVENOUS
  Administered 2015-03-03: 30 ug via INTRAVENOUS
  Administered 2015-03-03: 20 ug via INTRAVENOUS
  Administered 2015-03-03: 50 ug via INTRAVENOUS
  Administered 2015-03-03: 30 ug via INTRAVENOUS
  Administered 2015-03-03: 50 ug via INTRAVENOUS
  Administered 2015-03-04: 30 ug via INTRAVENOUS
  Administered 2015-03-04: 40 ug via INTRAVENOUS
  Administered 2015-03-04: 30 ug via INTRAVENOUS
  Administered 2015-03-04: 10 ug via INTRAVENOUS
  Administered 2015-03-04: 30 ug via INTRAVENOUS
  Administered 2015-03-04 – 2015-03-05 (×2): 10 ug via INTRAVENOUS
  Filled 2015-03-02: qty 25

## 2015-03-02 MED ORDER — LIDOCAINE HCL (CARDIAC) 20 MG/ML IV SOLN
INTRAVENOUS | Status: DC | PRN
  Administered 2015-03-02: 100 mg via INTRAVENOUS

## 2015-03-02 MED ORDER — ROCURONIUM BROMIDE 50 MG/5ML IV SOLN
INTRAVENOUS | Status: AC
  Filled 2015-03-02: qty 1

## 2015-03-02 MED ORDER — NALOXONE HCL 0.4 MG/ML IJ SOLN: 0.4000 mg | INTRAMUSCULAR | Status: DC | PRN

## 2015-03-02 SURGICAL SUPPLY — 90 items
ADH SKN CLS APL DERMABOND .7 (GAUZE/BANDAGES/DRESSINGS) ×2
APL SRG 22X2 LUM MLBL SLNT (VASCULAR PRODUCTS)
APL SRG 7X2 LUM MLBL SLNT (VASCULAR PRODUCTS)
APPLICATOR TIP COSEAL (VASCULAR PRODUCTS) IMPLANT
APPLICATOR TIP EXT COSEAL (VASCULAR PRODUCTS) IMPLANT
BLADE SURG 11 STRL SS (BLADE) ×1 IMPLANT
BRUSH CYTOL CELLEBRITY 1.5X140 (MISCELLANEOUS) IMPLANT
CANISTER SUCTION 2500CC (MISCELLANEOUS) ×3 IMPLANT
CATH KIT ON Q 5IN SLV (PAIN MANAGEMENT) ×2 IMPLANT
CATH THORACIC 28FR (CATHETERS) IMPLANT
CATH THORACIC 36FR (CATHETERS) IMPLANT
CATH THORACIC 36FR RT ANG (CATHETERS) IMPLANT
CLIP TI MEDIUM 6 (CLIP) ×3 IMPLANT
CONN ST 1/4X3/8  BEN (MISCELLANEOUS)
CONN ST 1/4X3/8 BEN (MISCELLANEOUS) IMPLANT
CONT SPEC 4OZ CLIKSEAL STRL BL (MISCELLANEOUS) ×6 IMPLANT
COVER TABLE BACK 60X90 (DRAPES) ×3 IMPLANT
DERMABOND ADVANCED (GAUZE/BANDAGES/DRESSINGS) ×1
DERMABOND ADVANCED .7 DNX12 (GAUZE/BANDAGES/DRESSINGS) IMPLANT
DRAIN CHANNEL 28F RND 3/8 FF (WOUND CARE) IMPLANT
DRAIN CHANNEL 32F RND 10.7 FF (WOUND CARE) IMPLANT
DRAPE LAPAROSCOPIC ABDOMINAL (DRAPES) ×3 IMPLANT
DRAPE WARM FLUID 44X44 (DRAPE) ×3 IMPLANT
DRILL BIT 7/64X5 (BIT) ×1 IMPLANT
ELECT BLADE 4.0 EZ CLEAN MEGAD (MISCELLANEOUS) ×3
ELECT BLADE 6.5 EXT (BLADE) ×2 IMPLANT
ELECT REM PT RETURN 9FT ADLT (ELECTROSURGICAL) ×3
ELECTRODE BLDE 4.0 EZ CLN MEGD (MISCELLANEOUS) ×2 IMPLANT
ELECTRODE REM PT RTRN 9FT ADLT (ELECTROSURGICAL) ×2 IMPLANT
FORCEPS BIOP RJ4 1.8 (CUTTING FORCEPS) IMPLANT
GAUZE SPONGE 4X4 12PLY STRL (GAUZE/BANDAGES/DRESSINGS) ×3 IMPLANT
GLOVE BIO SURGEON STRL SZ 6.5 (GLOVE) ×10 IMPLANT
GOWN STRL REUS W/ TWL LRG LVL3 (GOWN DISPOSABLE) ×8 IMPLANT
GOWN STRL REUS W/TWL LRG LVL3 (GOWN DISPOSABLE) ×12
KIT BASIN OR (CUSTOM PROCEDURE TRAY) ×3 IMPLANT
KIT CLEAN ENDO COMPLIANCE (KITS) ×2 IMPLANT
KIT ROOM TURNOVER OR (KITS) ×3 IMPLANT
KIT SUCTION CATH 14FR (SUCTIONS) ×3 IMPLANT
MARKER SKIN DUAL TIP RULER LAB (MISCELLANEOUS) ×3 IMPLANT
NDL BIOPSY TRANSBRONCH 21G (NEEDLE) IMPLANT
NEEDLE BIOPSY TRANSBRONCH 21G (NEEDLE) IMPLANT
NS IRRIG 1000ML POUR BTL (IV SOLUTION) ×12 IMPLANT
OIL SILICONE PENTAX (PARTS (SERVICE/REPAIRS)) ×3 IMPLANT
PACK CHEST (CUSTOM PROCEDURE TRAY) ×3 IMPLANT
PAD ARMBOARD 7.5X6 YLW CONV (MISCELLANEOUS) ×10 IMPLANT
PASSER SUT SWANSON 36MM LOOP (INSTRUMENTS) ×1 IMPLANT
RELOAD GOLD ECHELON 45 (STAPLE) ×6 IMPLANT
RELOAD STAPLE 35X2.5 WHT THIN (STAPLE) IMPLANT
SCISSORS LAP 5X35 DISP (ENDOMECHANICALS) IMPLANT
SEALANT PROGEL (MISCELLANEOUS) IMPLANT
SEALANT SURG COSEAL 4ML (VASCULAR PRODUCTS) IMPLANT
SEALANT SURG COSEAL 8ML (VASCULAR PRODUCTS) IMPLANT
SOLUTION ANTI FOG 6CC (MISCELLANEOUS) ×3 IMPLANT
STAPLE RELOAD 2.5MM WHITE (STAPLE) ×3 IMPLANT
STAPLER ECHELON POWERED (MISCELLANEOUS) ×1 IMPLANT
STAPLER VASCULAR ECHELON 35 (CUTTER) ×1 IMPLANT
SUT PROLENE 3 0 SH DA (SUTURE) IMPLANT
SUT PROLENE 4 0 RB 1 (SUTURE)
SUT PROLENE 4-0 RB1 .5 CRCL 36 (SUTURE) IMPLANT
SUT SILK  1 MH (SUTURE) ×4
SUT SILK 1 MH (SUTURE) ×8 IMPLANT
SUT SILK 1 TIES 10X30 (SUTURE) IMPLANT
SUT SILK 2 0 SH (SUTURE) IMPLANT
SUT SILK 2 0SH CR/8 30 (SUTURE) IMPLANT
SUT SILK 3 0SH CR/8 30 (SUTURE) ×1 IMPLANT
SUT STEEL 1 (SUTURE) IMPLANT
SUT VIC AB 1 CTX 18 (SUTURE) ×1 IMPLANT
SUT VIC AB 1 CTX 36 (SUTURE)
SUT VIC AB 1 CTX36XBRD ANBCTR (SUTURE) IMPLANT
SUT VIC AB 2-0 CTX 36 (SUTURE) ×2 IMPLANT
SUT VIC AB 2-0 UR6 27 (SUTURE) IMPLANT
SUT VIC AB 3-0 SH 8-18 (SUTURE) IMPLANT
SUT VIC AB 3-0 X1 27 (SUTURE) IMPLANT
SUT VICRYL 0 UR6 27IN ABS (SUTURE) IMPLANT
SUT VICRYL 2 TP 1 (SUTURE) ×2 IMPLANT
SWAB COLLECTION DEVICE MRSA (MISCELLANEOUS) IMPLANT
SYR 20ML ECCENTRIC (SYRINGE) ×2 IMPLANT
SYSTEM SAHARA CHEST DRAIN ATS (WOUND CARE) ×3 IMPLANT
TAPE CLOTH SURG 4X10 WHT LF (GAUZE/BANDAGES/DRESSINGS) ×1 IMPLANT
TAPE UMBILICAL COTTON 1/8X30 (MISCELLANEOUS) ×3 IMPLANT
TIP APPLICATOR SPRAY EXTEND 16 (VASCULAR PRODUCTS) IMPLANT
TOWEL OR 17X24 6PK STRL BLUE (TOWEL DISPOSABLE) ×6 IMPLANT
TOWEL OR 17X26 10 PK STRL BLUE (TOWEL DISPOSABLE) ×6 IMPLANT
TRAP SPECIMEN MUCOUS 40CC (MISCELLANEOUS) ×2 IMPLANT
TRAY FOLEY CATH 16FRSI W/METER (SET/KITS/TRAYS/PACK) ×3 IMPLANT
TROCAR XCEL BLUNT TIP 100MML (ENDOMECHANICALS) IMPLANT
TUBE ANAEROBIC SPECIMEN COL (MISCELLANEOUS) IMPLANT
TUBE CONNECTING 20X1/4 (TUBING) ×6 IMPLANT
TUNNELER SHEATH ON-Q 11GX8 DSP (PAIN MANAGEMENT) ×1 IMPLANT
WATER STERILE IRR 1000ML POUR (IV SOLUTION) ×6 IMPLANT

## 2015-03-02 NOTE — H&P (Signed)
[name]       [place] [address]             [phone]                    [name]  Medical Record [id number] Date of Birth: [birth year]  Referring: Tanda Rockers, MD Primary Care: [name], MD  Chief Complaint:    Left lung mass   History of Present Illness:    [name] 74 y.o. female was  seen in the office for evaluation of new discovery of left upper lobe lesion x2 found on chest xray done for cough and cold symptoms. Patient is distant smoker , for 7 years quit in 1960. Now has some SOB climbing stairs or hills but other wise with out complaints. Patient has history cad and visceral a aneurysms.    Patient seen by cardiology and prop recommendations made   Left mammary atretic lad ok on last cath  Current Activity/ Functional Status:  Patient is independent with mobility/ambulation, transfers, ADL's, IADL's.   Zubrod Score: At the time of surgery this patient's most appropriate activity status/level should be described as: []     0    Normal activity, no symptoms [x]     1    Restricted in physical strenuous activity but ambulatory, able to do out light work []     2    Ambulatory and capable of self care, unable to do work activities, up and about               >50 % of waking hours                              []     3    Only limited self care, in bed greater than 50% of waking hours []     4    Completely disabled, no self care, confined to bed or chair []     5    Moribund   Past Medical History  Diagnosis Date  . Hypothyroidism (acquired)     hypo  . Cervical disc disease   . Polymyalgia rheumatica (HCC)     2011 Dr. Charlestine Night  . Anemia   . Hyperlipidemia   . S/P CABG x 1 1996    CABG--LIMA-LAD for ostial LAD (not felt to be PCI amenable). EF NORMAL then; LIMA now atretic  . 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 Rheumatoid  arthritis ([place]) 2011    Dr.Truslow; fracture knees, hands and wrists -   . CAD in native artery 1996, 2002, 2005     Status post CABG x1 with LIMA-LAD for ostial LAD 90% stenosis --> down to 50% in 2002 and 30% in 2005.;  Atretic LIMA; Myoview 06/2010: Fixed anteroseptal, apical and inferoapical defect with moderate size. Most likely scar. Mild subendocardial ischemia. EF 71% LOW RISK.   [name] COPD (chronic obstructive pulmonary disease) ([place])   . Hypertension   . Stroke [facility]) 11-2014    TIA   . Abdominal aortic aneurysm (HCC)     REPAIRED IN 1993 BY [name]  AND HAS RECENTLY BEEN FOLLOWED BY [name]  . Shortness of breath dyspnea     with exertion  . Chronic kidney disease   . Urinary frequency  recent work up for TIA in [place] - negative   Past Surgical History  Procedure Laterality Date  . Splenectomy    . Cardiac catheterization  2005    (Most recent CATH) - ostial LAD lesion 20-30% (down from 90% initially). Atretic LIMA. Minimal disease the RCA and Circumflex system.  . Cervical spine surgery      plate 2008 Dr. Saintclair Halsted  . Carpal tunnel release    . Cataract extraction    . Hernia repair    . Bunionectomy  07/2011    right foot  . Coronary artery bypass graft  1996    INCLUDED AN INTERNAL MAMMARY ARTERY TO THE LAD. EF WAS NORMAL  . 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 Eye surgery    . Nm myoview ltd  March 2012    Fixed anteroseptal, apical and inferoapical defect with moderate size. Most likely scar. Mild subendocardial ischemia. EF 71% LOW RISK.   . Bunionectomy    . Vagina polyp      Family History  Problem Relation Age of Onset  . Hypertension    . Coronary artery disease    . Asthma Neg Hx   . Colon cancer Neg Hx   . Hypertension Mother   . Diabetes Mother   . Heart disease Mother   . Hyperlipidemia Mother   . Stroke Father   . Hyperlipidemia Sister    . Hypertension Sister   . Hypertension Daughter   . Cancer Paternal Uncle     Deceased from cancer not sure of site  . Hypertension Son   . Hyperlipidemia Son   . Hyperlipidemia Son   . Hypertension Son   . Hyperlipidemia Son   . Hypertension Son     Social History   Social History  . Marital Status: Married    Spouse Name: N/A  . Number of Children: 4  . Years of Education: N/A   Occupational History  . retired    Social History Main Topics  . Smoking status: Former Smoker    Quit date: 12/04/1958  . Smokeless tobacco: Never Used  . Alcohol Use: 0.6 - 1.8 oz/week    1-3 Standard drinks or equivalent per week  . Drug Use: No  . Sexual Activity: Not Currently          Social History Narrative   [facility]  MARRIED -RETIRED ; [name]. GOES TO [place] 3 DAYS A WEEK FOR ABOUT 45 MIN. PER SESSION      [facility] at [facility] since dec 2015    History  Smoking status  . Former Smoker  . Quit date: 12/04/1958  Smokeless tobacco  . Never Used    History  Alcohol Use  . 0.6 - 1.8 oz/week  . 1-3 Standard drinks or equivalent per week     Allergies  Allergen Reactions  . Aspirin Other (See Comments)    Hurts stomach  . Codeine Nausea And Vomiting  . Nitroglycerin Other (See Comments)    Heart rate drops    Current Facility-Administered Medications  Medication Dose Route Frequency Provider Last Rate Last Dose  . cefUROXime (ZINACEF) 1.5 g in dextrose 5 % 50 mL IVPB  1.5 g Intravenous To SS-Surg Grace Isaac, MD       Facility-Administered Medications Ordered in Other Encounters  Medication Dose Route Frequency Provider  Last Rate Last Dose  . fentaNYL (SUBLIMAZE) injection    Anesthesia Intra-op Durwin Glaze Flowers, CRNA   25 mcg at 03/02/15 0654  . lactated ringers infusion   Intravenous Continuous PRN Rokoshi T Flowers, Immunologist      . midazolam (VERSED) 5 MG/5ML injection    Anesthesia Intra-op Durwin Glaze  Flowers, CRNA   0.5 mg at 03/02/15 [id number]     Review of Systems:     Cardiac Review of Systems: Y or N  Chest Pain [ n   ]  Resting SOB [  n ] Exertional SOB  [n  ]  Orthopnea [  n]   Pedal Edema [  n ]    Palpitations n[  ] Syncope  [ n ]   Presyncope [  n ]  General Review of Systems: [Y] = yes [  ]=no Constitional: recent weight change [ n ];  Wt loss over the last 3 months [   ] anorexia [  ]; fatigue [  ]; nausea [  ]; night sweats [  ]; fever [  ]; or chills [  ];          Dental: poor dentition[  ]; Last Dentist visit:   Eye : blurred vision [  ]; diplopia [   ]; vision changes [  ];  Amaurosis fugax[  ]; Resp: cough [  ];  wheezing[  ];  hemoptysis[  ]; shortness of breath[  ]; paroxysmal nocturnal dyspnea[  ]; dyspnea on exertion[  ]; or orthopnea[  ];  GI:  gallstones[  ], vomiting[  ];  dysphagia[  ]; melena[  ];  hematochezia [  ]; heartburn[  ];   Hx of  Colonoscopy[  ]; GU: kidney stones [  ]; hematuria[  ];   dysuria [  ];  nocturia[  ];  history of     obstruction [  ]; urinary frequency [  ]             Skin: rash, swelling[  ];, hair loss[  ];  peripheral edema[  ];  or itching[  ]; Musculosketetal: myalgias[  ];  joint swelling[  ];  joint erythema[  ];  joint pain[  ];  back pain[  ];  Heme/Lymph: bruising[  ];  bleeding[  ];  anemia[  ];  Neuro: TIA[several months ago  ];  headaches[  ];  stroke[n  ];  vertigo[  ];  seizures[  ];   paresthesias[  ];  difficulty walking[  ];  Psych:depression[  ]; anxiety[  ];  Endocrine: diabetes[  ];  thyroid dysfunction[  ];  Immunizations: Flu up to date [ y ]; Pneumococcal up to date [  ];  Other:  Physical Exam: BP 153/59 mmHg  Pulse 70  Temp(Src) 97.6 F (36.4 C) (Oral)  Ht 5' 3.5" (1.613 m)  Wt 152 lb (68.947 kg)  BMI 26.50 kg/m2  SpO2 96%  PHYSICAL EXAMINATION: General appearance: alert, cooperative and no distress Head: Normocephalic, without obvious abnormality, atraumatic Neck: no adenopathy, no carotid bruit, no  JVD, supple, symmetrical, trachea midline and thyroid not enlarged, symmetric, no tenderness/mass/nodules Lymph nodes: Cervical, supraclavicular, and axillary nodes normal. Resp: clear to auscultation bilaterally Back: symmetric, no curvature. ROM normal. No CVA tenderness. Cardio: regular rate and rhythm, S1, S2 normal, no murmur, click, rub or gallop GI: soft, non-tender; bowel sounds normal; no masses,  no organomegaly Extremities: extremities normal, atraumatic, no cyanosis or edema and Homans sign is  negative, no sign of DVT Neurologic: Grossly normal  Diagnostic Studies & Laboratory data:     Recent Radiology Findings:  Dg Chest 2 View  02/26/2015  CLINICAL DATA:  History of left lung lesion.  Scheduled for surgery. EXAM: CHEST  2 VIEW COMPARISON:  01/29/2015 and PET-CT 02/10/2015 FINDINGS: Patient has a known lesion in left upper lobe adjacent to the posterior aortic arch. There is a 1.6 cm round and partially cavitary lesion at this location. Otherwise, the lungs are clear. Heart size is normal. Prior median sternotomy. Trachea is midline. No large pleural effusions. IMPRESSION: No acute chest findings. Known 1.6 cm cavitary lesion in the left upper lobe. Electronically Signed   By: Markus Daft M.D.   On: 02/26/2015 15:10   Dg Chest 2 View  01/29/2015  CLINICAL DATA:  Seven day history of cough EXAM: CHEST  2 VIEW COMPARISON:  None. FINDINGS: Lungs are somewhat hyperexpanded. There is no edema or consolidation. On the lateral view, there is a nodular opacity measuring 1.5 x 1.4 cm at the level of the anterior upper thoracic spine. This opacity is not seen on the frontal view. Patient is status post internal mammary bypass grafting. Heart size and pulmonary vascularity are within normal limits. No adenopathy. No bone lesions. IMPRESSION: 1.5 x 1.4 cm nodular opacity at the level of the anterior upper thoracic spine seen on the lateral view. This finding warrants noncontrast enhanced chest CT to  further assess. Lungs are hyperexpanded.  No edema or consolidation. These results will be called to the ordering clinician or representative by the Radiologist Assistant, and communication documented in the PACS or zVision Dashboard. Electronically Signed   By: Lowella Grip III M.D.   On: 01/29/2015 14:41   Ct Chest Wo Contrast  01/29/2015  CLINICAL DATA:  74 year old female with nodule identified on chest x-ray today. EXAM: CT CHEST WITHOUT CONTRAST TECHNIQUE: Multidetector CT imaging of the chest was performed following the standard protocol without IV contrast. COMPARISON:  01/29/2015 and prior chest radiographs. FINDINGS: Mediastinum/Nodes: Cardiac surgical changes are identified. Aortic atherosclerotic calcifications are present without thoracic aortic aneurysm. There is no evidence of pericardial effusion or enlarged lymph nodes. Lungs/Pleura: A 1.5 x 1.6 cm left upper lobe nodule (image 20) with tiny focus of internal gas is noted. A 7 mm left upper lobe nodule (image 28) is also identified. There is no evidence of airspace disease, consolidation, endobronchial lesion or pleural effusion. Upper abdomen: No acute abnormalities identified. Musculoskeletal: Sclerosis of a lower thoracic vertebra (probably T11). No other acute or suspicious bony abnormalities are identified. IMPRESSION: 1.5 x 1.6 cm left upper lobe nodule and 7 mm left upper lobe nodule, suspicious for malignancy/metastatic disease. Recommend thoracic surgery consultation or PET-CT in the absence of infectious symptoms. Sclerosis of the lower thoracic vertebral body (likely T11) -metastatic disease is not excluded. PET-CT may be helpful for further evaluation as well. Electronically Signed   By: Margarette Canada M.D.   On: 01/29/2015 17:50   Nm Pet Image Initial (pi) Skull Base To Thigh  02/10/2015  CLINICAL DATA:  Initial treatment strategy for pulmonary nodule. Irregular LEFT upper lobe pulmonary nodule. EXAM: NUCLEAR MEDICINE PET SKULL  BASE TO THIGH TECHNIQUE: 7.8 mCi F-18 FDG was injected intravenously. Full-ring PET imaging was performed from the skull base to thigh after the radiotracer. CT data was obtained and used for attenuation correction and anatomic localization. FASTING BLOOD GLUCOSE:  Value: 80 mg/dl COMPARISON:  CT 01/29/2015 FINDINGS: NECK No hypermetabolic lymph  nodes in the neck. CHEST 16 mm hypermetabolic nodule in the LEFT upper lobe with SUV max equal 4.8 (image 50 fused data set). There is mild atelectasis at lung bases. No additional pulmonary nodules No hypermetabolic mediastinal or supraclavicular lymph nodes. ABDOMEN/PELVIS No abnormal hypermetabolic activity within the liver, pancreas, adrenal glands, or spleen. No hypermetabolic lymph nodes in the abdomen or pelvis. Low-density lesion in the LEFT kidney without metabolic activity. New SKELETON Intense uptake at the facet joint at L5-S1 on the RIGHT which is felt to be degenerative in nature. Sclerotic T11 vertebral body does not have metabolic activity is most consistent with posttraumatic injury. IMPRESSION: 1. Hypermetabolic LEFT upper lobe pulmonary nodule is concerning for primary bronchogenic carcinoma. 2. Staging by FDG PET imaging T1a N0 M0 3. No evidence of mediastinal nodal metastasis or distant metastasis. Electronically Signed   By: Suzy Bouchard M.D.   On: 02/10/2015 15:18     I have independently reviewed the above radiologic studies.  Recent Lab Findings: Lab Results  Component Value Date   WBC 7.0 02/26/2015   HGB 12.0 02/26/2015   HCT 36.3 02/26/2015   PLT 319 02/26/2015   GLUCOSE 88 02/26/2015   CHOL 168 11/26/2014   TRIG 111.0 11/26/2014   HDL 75.10 11/26/2014   LDLDIRECT 162.1 10/28/2009   LDLCALC 71 11/26/2014   ALT 24 02/26/2015   AST 27 02/26/2015   NA 135 02/26/2015   K 4.8 02/26/2015   CL 104 02/26/2015   CREATININE 1.26* 02/26/2015   BUN 29* 02/26/2015   CO2 21* 02/26/2015   TSH 0.93 11/26/2014   INR 1.00 02/26/2015      Pulmonary Function Diagnosis: Minimal Obstructive Airways Disease with reversibility Mild Diffusion Defect PFT's done  FEV1 1.58  80% up to 1.78 90% with bronchodilators DLCO 16.27   22.91 71%  Assessment / Plan:   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 I have recommended proceeding with surgical resection.  Patient cleared by cardiology With history of cabg 1996 and known abdominal vascular disease.  Patient on Plavix to stop 5 days preop Plan bronchoscopy left vats poss left upper lobectomy in patent with previous CABG and takedown of lima. Risks and options discussed in detail with patient and husband   The goals risks and alternatives of the planned surgical procedure bronchoscopy left VATS and lung resection  have been discussed with the patient in detail. The risks of the procedure including death, infection, stroke, myocardial infarction, bleeding, blood transfusion have all been discussed specifically.  I have quoted [name] a 3 % of perioperative mortality and a complication rate as high as 30 %. The patient's questions have been answered.[name] is willing  to proceed with the planned procedure.   Grace Isaac MD      [address] [place] [address] Office [phone]   Beeper [phone]  03/02/2015 7:24 AM

## 2015-03-02 NOTE — Brief Op Note (Addendum)
03/02/2015  11:18 AM  PATIENT:  [name]  74 y.o. 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   PROCEDURE:  BRONCHOSCOPY, LEFT  VIDEO ASSISTED THORACOSCOPY (VATS), LEFT MINI THORACOTOMY, LEFT UPPER LOBE WEDGE, PLACEMENT OF ON-Q PUMP (Left)  SURGEON:  Surgeon(s) and Role:    * Grace Isaac, MD - Primary  PHYSICIAN ASSISTANT: Lars Pinks PA-C  ANESTHESIA:   general  EBL:  Total I/O In: -  Out: 250 [Urine:200; Blood:50]  BLOOD ADMINISTERED:none  DRAINS: 78 French chest tube placed in the left pleural space   LOCAL MEDICATIONS USED:  BUPIVICAINE   SPECIMEN:  Source of Specimen:  Wedge of LUL  DISPOSITION OF SPECIMEN:  Pathology;Frozen section of wedge of LUL showed necrotizing granuloma  COUNTS CORRECT:  YES  DICTATION: .Dragon Dictation  PLAN OF CARE: Admit to inpatient   PATIENT DISPOSITION:  PACU - hemodynamically stable.   Delay start of Pharmacological VTE agent (>24hrs) due to surgical blood loss or risk of bleeding: yes

## 2015-03-02 NOTE — Anesthesia Procedure Notes (Signed)
Procedure Name: Intubation Date/Time: 03/02/2015 7:54 AM Performed by: Ollen Bowl Pre-anesthesia Checklist: Patient identified, Timeout performed, Emergency Drugs available, Suction available and Patient being monitored Patient Re-evaluated:Patient Re-evaluated prior to inductionOxygen Delivery Method: Circle system utilized and Simple face mask Preoxygenation: Pre-oxygenation with 100% oxygen Intubation Type: IV induction Ventilation: Mask ventilation without difficulty Laryngoscope Size: Miller and 2 Grade View: Grade I Endobronchial tube: Left, Double lumen EBT, EBT position confirmed by fiberoptic bronchoscope and EBT position confirmed by auscultation and 37 Fr Number of attempts: 1 Airway Equipment and Method: Patient positioned with wedge pillow and Stylet Placement Confirmation: ETT inserted through vocal cords under direct vision,  positive ETCO2 and breath sounds checked- equal and bilateral Tube secured with: Tape Dental Injury: Teeth and Oropharynx as per pre-operative assessment

## 2015-03-02 NOTE — Progress Notes (Signed)
Patient ID: [name], female   DOB: 1940-06-22, 74 y.o.   MRN: [id number]  SICU Evening Rounds:  Hemodynamically stable  sats 92%  Pain under control  Small persistent air leak, minimal drainage from chest tube.  Urine output ok

## 2015-03-02 NOTE — Anesthesia Preprocedure Evaluation (Addendum)
Anesthesia Evaluation  Patient identified by MRN, date of birth, ID band Patient awake    Reviewed: Allergy & Precautions, NPO status   History of Anesthesia Complications Negative for: history of anesthetic complications  Airway Mallampati: II  TM Distance: >3 FB Neck ROM: Full    Dental no notable dental hx. (+) Dental Advisory Given   Pulmonary COPD, former smoker,    Pulmonary exam normal        Cardiovascular hypertension, + CAD, + CABG and + Peripheral Vascular Disease  Normal cardiovascular exam     Neuro/Psych TIAnegative psych ROS   GI/Hepatic negative GI ROS, Neg liver ROS,   Endo/Other  Hypothyroidism   Renal/GU Renal InsufficiencyRenal disease     Musculoskeletal   Abdominal   Peds  Hematology   Anesthesia Other Findings   Reproductive/Obstetrics                            Anesthesia Physical Anesthesia Plan  ASA: III  Anesthesia Plan: General   Post-op Pain Management:    Induction: Intravenous  Airway Management Planned: Double Lumen EBT  Additional Equipment: Arterial line and CVP  Intra-op Plan:   Post-operative Plan: Possible Post-op intubation/ventilation  Informed Consent: I have reviewed the patients History and Physical, chart, labs and discussed the procedure including the risks, benefits and alternatives for the proposed anesthesia with the patient or authorized representative who has indicated his/her understanding and acceptance.   Dental advisory given  Plan Discussed with: CRNA, Anesthesiologist and Surgeon  Anesthesia Plan Comments:        Anesthesia Quick Evaluation

## 2015-03-02 NOTE — Anesthesia Postprocedure Evaluation (Signed)
Anesthesia Post Note  Patient: [name]  Procedure(s) Performed: Procedure(s) (LRB): BRONCHOSCOPY (N/A) VIDEO ASSISTED THORACOSCOPY (VATS), MINI THORACOTOMY, LEFT UPPER LOBE WEDGE, TAKE DOWN OF INTERNAL MAMMARY LESIONS, PLACEMENT OF ON-Q PUMP (Left)  Anesthesia type: general  Patient location: PACU  Post pain: Pain level controlled  Post assessment: Patient's Cardiovascular Status Stable  Last Vitals:  Filed Vitals:   03/02/15 1415  BP:   Pulse: 76  Temp:   Resp: 22    Post vital signs: Reviewed and stable  Level of consciousness: sedated  Complications: No apparent anesthesia complications

## 2015-03-02 NOTE — Progress Notes (Signed)
Called dr singer/MDA re" sbp +/- 100 with mean 58, orders rec'd for albumin x 1 , repeat x 1 if needed.

## 2015-03-02 NOTE — Transfer of Care (Signed)
Immediate Anesthesia Transfer of Care Note  Patient: VIYONA GUTMANN  Procedure(s) Performed: Procedure(s): BRONCHOSCOPY (N/A) VIDEO ASSISTED THORACOSCOPY (VATS), MINI THORACOTOMY, LEFT UPPER LOBE WEDGE, TAKE DOWN OF INTERNAL MAMMARY LESIONS, PLACEMENT OF ON-Q PUMP (Left)  Patient Location: PACU  Anesthesia Type:General  Level of Consciousness: awake, alert  and oriented  Airway & Oxygen Therapy: Patient Spontanous Breathing and Patient connected to face mask oxygen  Post-op Assessment: Report given to RN and Post -op Vital signs reviewed and stable  Post vital signs: Reviewed and stable  Last Vitals:  Filed Vitals:   03/02/15 1200  BP: 121/44  Pulse: 69  Temp: 36.3 C  Resp: 14    Complications: No apparent anesthesia complications

## 2015-03-03 ENCOUNTER — Encounter (HOSPITAL_COMMUNITY): Payer: Self-pay | Admitting: Cardiothoracic Surgery

## 2015-03-03 ENCOUNTER — Inpatient Hospital Stay (HOSPITAL_COMMUNITY): Payer: Medicare Other

## 2015-03-03 LAB — BLOOD GAS, ARTERIAL
ACID-BASE DEFICIT: 3.4 mmol/L — AB (ref 0.0–2.0)
Bicarbonate: 21.5 mEq/L (ref 20.0–24.0)
O2 CONTENT: 2 L/min
O2 SAT: 92.6 %
Patient temperature: 98.6
TCO2: 22.7 mmol/L (ref 0–100)
pCO2 arterial: 40.9 mmHg (ref 35.0–45.0)
pH, Arterial: 7.34 — ABNORMAL LOW (ref 7.350–7.450)
pO2, Arterial: 70.9 mmHg — ABNORMAL LOW (ref 80.0–100.0)

## 2015-03-03 LAB — GLUCOSE, CAPILLARY
Glucose-Capillary: 100 mg/dL — ABNORMAL HIGH (ref 65–99)
Glucose-Capillary: 101 mg/dL — ABNORMAL HIGH (ref 65–99)
Glucose-Capillary: 103 mg/dL — ABNORMAL HIGH (ref 65–99)
Glucose-Capillary: 138 mg/dL — ABNORMAL HIGH (ref 65–99)

## 2015-03-03 LAB — BASIC METABOLIC PANEL
ANION GAP: 10 (ref 5–15)
BUN: 22 mg/dL — ABNORMAL HIGH (ref 6–20)
CALCIUM: 9.1 mg/dL (ref 8.9–10.3)
CO2: 23 mmol/L (ref 22–32)
CREATININE: 1.17 mg/dL — AB (ref 0.44–1.00)
Chloride: 107 mmol/L (ref 101–111)
GFR, EST AFRICAN AMERICAN: 51 mL/min — AB (ref >60)
GFR, EST NON AFRICAN AMERICAN: 44 mL/min — AB (ref >60)
Glucose, Bld: 115 mg/dL — ABNORMAL HIGH (ref 65–99)
Potassium: 4.5 mmol/L (ref 3.5–5.1)
SODIUM: 140 mmol/L (ref 135–145)

## 2015-03-03 LAB — CBC
HCT: 33 % — ABNORMAL LOW (ref 36.0–46.0)
HEMOGLOBIN: 11 g/dL — AB (ref 12.0–15.0)
MCH: 31 pg (ref 26.0–34.0)
MCHC: 33.3 g/dL (ref 30.0–36.0)
MCV: 93 fL (ref 78.0–100.0)
PLATELETS: 278 10*3/uL (ref 150–400)
RBC: 3.55 MIL/uL — AB (ref 3.87–5.11)
RDW: 15.8 % — ABNORMAL HIGH (ref 11.5–15.5)
WBC: 18.4 10*3/uL — AB (ref 4.0–10.5)

## 2015-03-03 MED ORDER — ENOXAPARIN SODIUM 30 MG/0.3ML ~~LOC~~ SOLN
30.0000 mg | SUBCUTANEOUS | Status: DC
  Administered 2015-03-03 – 2015-03-05 (×3): 30 mg via SUBCUTANEOUS
  Filled 2015-03-03 (×3): qty 0.3

## 2015-03-03 MED ORDER — CETYLPYRIDINIUM CHLORIDE 0.05 % MT LIQD
7.0000 mL | Freq: Two times a day (BID) | OROMUCOSAL | Status: DC
  Administered 2015-03-03 – 2015-03-07 (×7): 7 mL via OROMUCOSAL

## 2015-03-03 NOTE — Progress Notes (Signed)
TCTS DAILY ICU PROGRESS NOTE                   [address]            [place] [address]          [phone]   1 Day Post-Op Procedure(s) (LRB): BRONCHOSCOPY (N/A) VIDEO ASSISTED THORACOSCOPY (VATS), MINI THORACOTOMY, LEFT UPPER LOBE WEDGE, TAKE DOWN OF INTERNAL MAMMARY LESIONS, PLACEMENT OF ON-Q PUMP (Left)  Total Length of Stay:  LOS: 1 day   Subjective:  Patient has no complaints.  She is up eating breakfast this morning.  + ambulation  Objective: Vital signs in last 24 hours: Temp:  [97.4 F (36.3 C)-98.6 F (37 C)] 98.6 F (37 C) (11/02 0710) Pulse Rate:  [61-77] 67 (11/02 0800) Cardiac Rhythm:  [-] Normal sinus rhythm (11/02 0800) Resp:  [11-27] 23 (11/02 0800) BP: (107-187)/(44-92) 171/55 mmHg (11/02 0800) SpO2:  [90 %-99 %] 98 % (11/02 0800) Arterial Line BP: (82-225)/(40-83) 225/68 mmHg (11/02 0400)  Filed Weights   03/02/15 0549  Weight: 152 lb (68.947 kg)    Weight change:    Intake/Output from previous day: 11/01 0701 - 11/02 0700 In: 3823.3 [P.O.:380; I.V.:3143.3; IV Piggyback:300] Out: 1800 [Urine:1250; Blood:50; Chest Tube:500]  Intake/Output this shift: Total I/O In: 60 [I.V.:10; IV Piggyback:50] Out: 75 [Urine:75]  Current Meds: Scheduled Meds: . acetaminophen  1,000 mg Oral 4 times per day   Or  . acetaminophen (TYLENOL) oral liquid 160 mg/5 mL  1,000 mg Oral 4 times per day  . antiseptic oral rinse  7 mL Mouth Rinse BID  . aspirin EC  81 mg Oral Daily  . atorvastatin  80 mg Oral Daily  . bisacodyl  10 mg Oral Daily  . enoxaparin (LOVENOX) injection  30 mg Subcutaneous Q24H  . fentaNYL   Intravenous 6 times per day  . insulin aspart  0-24 Units Subcutaneous 4 times per day  . levothyroxine  88 mcg Oral QAC breakfast  . loratadine  10 mg Oral Daily  . metoprolol succinate  25 mg Oral Daily  . pantoprazole  40 mg Oral Daily  . psyllium  1 packet Oral Daily  . senna-docusate  1 tablet Oral QHS   Continuous  Infusions: . sodium chloride 100 mL/hr at 03/02/15 2026  . bupivacaine ON-Q pain pump     PRN Meds:.diphenhydrAMINE **OR** diphenhydrAMINE, naloxone **AND** sodium chloride, ondansetron (ZOFRAN) IV, potassium chloride, traMADol  General appearance: alert, cooperative and no distress Heart: regular rate and rhythm Lungs: clear to auscultation bilaterally Abdomen: soft, non-tender; bowel sounds normal; no masses,  no organomegaly Wound: clean and dry  Lab Results: CBC: Recent Labs  03/03/15 0354  WBC 18.4*  HGB 11.0*  HCT 33.0*  PLT 278   BMET:  Recent Labs  03/03/15 0354  NA 140  K 4.5  CL 107  CO2 23  GLUCOSE 115*  BUN 22*  CREATININE 1.17*  CALCIUM 9.1    PT/INR: No results for input(s): LABPROT, INR in the last 72 hours. Radiology: Dg Chest Port 1 View  03/03/2015  CLINICAL DATA:  Pneumothorax, chest tube treatment, status post left VATS for left upper lobe mass EXAM: PORTABLE CHEST 1 VIEW COMPARISON:  Portable chest x-ray of March 02, 2015 FINDINGS: The small left apical pneumothorax persists and is stable. The left-sided chest tube has been partially withdrawn since it its tip now lies over the posterior medial aspect of the fifth rib. There is a  persistent left retrocardiac density and small left pleural effusion but overall aeration of the lung base improved. The right lung is well-expanded. There is right basilar atelectasis. The heart is mildly enlarged. The pulmonary vascularity is not significantly engorged. There are post CABG changes. The right internal jugular venous catheter tip projects over the midportion of the SVC. IMPRESSION: 1. Stable less than 10% left apical pneumothorax. Interval repositioning of the left-sided chest tube as described. Persistent left lower lobe atelectasis and trace pleural effusion. 2. Stable right basilar subsegmental atelectasis. Electronically Signed   By: David  Martinique M.D.   On: 03/03/2015 07:21   Dg Chest Port 1  View  03/02/2015  CLINICAL DATA:  74 year old female with left VATS for left upper lobe lesion. EXAM: PORTABLE CHEST 1 VIEW COMPARISON:  02/26/2015 FINDINGS: Left thoracic postprocedural changes are identified. A left thoracostomy tube is noted with tip overlying the apex. There may be a tiny left apical pneumothorax present. Left lower lung atelectasis is present. Cardiomegaly and CABG changes again identified. A right IJ central venous catheter with tip overlying the lower SVC noted. IMPRESSION: Left VATS with left lower lung atelectasis and possible tiny left apical pneumothorax. Electronically Signed   By: Margarette Canada M.D.   On: 03/02/2015 14:07     Assessment/Plan: S/P Procedure(s) (LRB): BRONCHOSCOPY (N/A) VIDEO ASSISTED THORACOSCOPY (VATS), MINI THORACOTOMY, LEFT UPPER LOBE WEDGE, TAKE DOWN OF INTERNAL MAMMARY LESIONS, PLACEMENT OF ON-Q PUMP (Left)  1. Chest tube- 500 cc output, will leave chest tube to water seal today 2. Pulm- no acute issues, continue IS, CXR with small pneumothorax 3. D/C Arterial Line 4. Renal- creatinine stable, at 1.17 5. Tolerating diet, IV fluid at [facility] 6. Dispo- patient stable, will transfer to 3S     BARRETT, ERIN 03/03/2015 9:07 AM

## 2015-03-04 ENCOUNTER — Inpatient Hospital Stay (HOSPITAL_COMMUNITY): Payer: Medicare Other

## 2015-03-04 LAB — COMPREHENSIVE METABOLIC PANEL
ALT: 74 U/L — AB (ref 14–54)
AST: 81 U/L — AB (ref 15–41)
Albumin: 3.3 g/dL — ABNORMAL LOW (ref 3.5–5.0)
Alkaline Phosphatase: 107 U/L (ref 38–126)
Anion gap: 7 (ref 5–15)
BILIRUBIN TOTAL: 0.8 mg/dL (ref 0.3–1.2)
BUN: 21 mg/dL — AB (ref 6–20)
CO2: 26 mmol/L (ref 22–32)
CREATININE: 1.18 mg/dL — AB (ref 0.44–1.00)
Calcium: 9.5 mg/dL (ref 8.9–10.3)
Chloride: 105 mmol/L (ref 101–111)
GFR, EST AFRICAN AMERICAN: 51 mL/min — AB (ref >60)
GFR, EST NON AFRICAN AMERICAN: 44 mL/min — AB (ref >60)
Glucose, Bld: 121 mg/dL — ABNORMAL HIGH (ref 65–99)
POTASSIUM: 4.3 mmol/L (ref 3.5–5.1)
Sodium: 138 mmol/L (ref 135–145)
TOTAL PROTEIN: 6.1 g/dL — AB (ref 6.5–8.1)

## 2015-03-04 LAB — CBC
HEMATOCRIT: 36.2 % (ref 36.0–46.0)
Hemoglobin: 11.7 g/dL — ABNORMAL LOW (ref 12.0–15.0)
MCH: 29.8 pg (ref 26.0–34.0)
MCHC: 32.3 g/dL (ref 30.0–36.0)
MCV: 92.1 fL (ref 78.0–100.0)
Platelets: 298 10*3/uL (ref 150–400)
RBC: 3.93 MIL/uL (ref 3.87–5.11)
RDW: 16 % — AB (ref 11.5–15.5)
WBC: 19 10*3/uL — AB (ref 4.0–10.5)

## 2015-03-04 LAB — GLUCOSE, CAPILLARY
Glucose-Capillary: 120 mg/dL — ABNORMAL HIGH (ref 65–99)
Glucose-Capillary: 133 mg/dL — ABNORMAL HIGH (ref 65–99)
Glucose-Capillary: 81 mg/dL (ref 65–99)
Glucose-Capillary: 87 mg/dL (ref 65–99)

## 2015-03-04 MED ORDER — LOSARTAN POTASSIUM 25 MG PO TABS
25.0000 mg | ORAL_TABLET | Freq: Every day | ORAL | Status: DC
  Administered 2015-03-04 – 2015-03-07 (×4): 25 mg via ORAL
  Filled 2015-03-04 (×5): qty 1

## 2015-03-04 NOTE — Progress Notes (Addendum)
TCTS DAILY ICU PROGRESS NOTE                   [address]            [place] [address]          [phone]   2 Days Post-Op Procedure(s) (LRB): BRONCHOSCOPY (N/A) VIDEO ASSISTED THORACOSCOPY (VATS), MINI THORACOTOMY, LEFT UPPER LOBE WEDGE, TAKE DOWN OF INTERNAL MAMMARY LESIONS, PLACEMENT OF ON-Q PUMP (Left)  Total Length of Stay:  LOS: 2 days   Subjective:  [name] is without complaints.  + ambulation   + BM  Objective: Vital signs in last 24 hours: Temp:  [98.2 F (36.8 C)-98.7 F (37.1 C)] 98.5 F (36.9 C) (11/03 0724) Pulse Rate:  [67-92] 84 (11/03 0700) Cardiac Rhythm:  [-] Normal sinus rhythm (11/03 0400) Resp:  [12-34] 26 (11/03 0700) BP: (143-191)/(54-88) 186/88 mmHg (11/03 0600) SpO2:  [92 %-99 %] 97 % (11/03 0700)  Filed Weights   03/02/15 0549  Weight: 152 lb (68.947 kg)    Weight change:    Intake/Output from previous day: 11/02 0701 - 11/03 0700 In: [id number] [P.O.:900; I.V.:240; IV Piggyback:50] Out: 3135 [Urine:2865; Chest Tube:270]  Current Meds: Scheduled Meds: . acetaminophen  1,000 mg Oral 4 times per day   Or  . acetaminophen (TYLENOL) oral liquid 160 mg/5 mL  1,000 mg Oral 4 times per day  . antiseptic oral rinse  7 mL Mouth Rinse BID  . aspirin EC  81 mg Oral Daily  . atorvastatin  80 mg Oral Daily  . bisacodyl  10 mg Oral Daily  . enoxaparin (LOVENOX) injection  30 mg Subcutaneous Q24H  . fentaNYL   Intravenous 6 times per day  . insulin aspart  0-24 Units Subcutaneous 4 times per day  . levothyroxine  88 mcg Oral QAC breakfast  . loratadine  10 mg Oral Daily  . losartan  25 mg Oral Daily  . metoprolol succinate  25 mg Oral Daily  . pantoprazole  40 mg Oral Daily  . psyllium  1 packet Oral Daily  . senna-docusate  1 tablet Oral QHS   Continuous Infusions: . sodium chloride 10 mL/hr at 03/04/15 0400  . bupivacaine ON-Q pain pump     PRN Meds:.diphenhydrAMINE **OR** diphenhydrAMINE, naloxone **AND** sodium  chloride, ondansetron (ZOFRAN) IV, potassium chloride, traMADol  General appearance: alert, cooperative and no distress Heart: regular rate and rhythm Lungs: clear to auscultation bilaterally Abdomen: soft, non-tender; bowel sounds normal; no masses,  no organomegaly Wound: clean and dry  Lab Results: CBC: Recent Labs  03/03/15 0354 03/04/15 0440  WBC 18.4* 19.0*  HGB 11.0* 11.7*  HCT 33.0* 36.2  PLT 278 298   BMET:  Recent Labs  03/03/15 0354 03/04/15 0440  NA 140 138  K 4.5 4.3  CL 107 105  CO2 23 26  GLUCOSE 115* 121*  BUN 22* 21*  CREATININE 1.17* 1.18*  CALCIUM 9.1 9.5    PT/INR: No results for input(s): LABPROT, INR in the last 72 hours. Radiology: Dg Chest Port 1 View  03/04/2015  CLINICAL DATA:  Chest tube placement EXAM: PORTABLE CHEST 1 VIEW COMPARISON:  03/03/2015 FINDINGS: There is a right IJ catheter with tip in the projection of the cavoatrial junction. Left chest tube is in place. The no significant pneumothorax identified. Left chest wall subcutaneous emphysema identified. There are small bilateral pleural effusions identified left greater than right. Atelectasis noted in the left base. IMPRESSION: 1. Left chest  tube in place, no pneumothorax identified. 2. Bilateral pleural effusions. Electronically Signed   By: Kerby Moors M.D.   On: 03/04/2015 07:28     Assessment/Plan: S/P Procedure(s) (LRB): BRONCHOSCOPY (N/A) VIDEO ASSISTED THORACOSCOPY (VATS), MINI THORACOTOMY, LEFT UPPER LOBE WEDGE, TAKE DOWN OF INTERNAL MAMMARY LESIONS, PLACEMENT OF ON-Q PUMP (Left)  1. CV- Hypertension- will restart home Cozaar, continue Lopressor 2. Pulm- off oxygen, no acute issues, continue IS- CXR is free from pneumothorax, some sub-q emphysema along left side 3. Chest tube -small 1+ air leak with cough, will leave chest tube to water seal today 4. Renal- creatinine stable 5. Dispo- patient stable, awaiting transfer to stepdown unit, leave chest tube to water seal  today, repeat CXR in AM     BARRETT, ERIN 03/04/2015 7:41 AM   Recent Results (from the past 240 hour(s))  Surgical pcr screen     Status: None   Collection Time: 02/26/15  1:04 PM  Result Value Ref Range Status   MRSA, PCR NEGATIVE NEGATIVE Final   Staphylococcus aureus NEGATIVE NEGATIVE Final    Comment:        The Xpert SA Assay (FDA approved for NASAL specimens in patients over 77 years of age), is one component of a comprehensive surveillance program.  Test performance has been validated by [facility] for patients greater than or equal to 84 year old. It is not intended to diagnose infection nor to guide or monitor treatment.   AFB culture with smear     Status: None (Preliminary result)   Collection Time: 03/02/15 10:26 AM  Result Value Ref Range Status   Specimen Description TISSUE LUNG LEFT  Final   Special Requests LUL  Final   Acid Fast Smear   Final    NO ACID FAST BACILLI SEEN Performed at [facility]    Culture   Final    CULTURE WILL BE EXAMINED FOR 6 WEEKS BEFORE ISSUING A FINAL REPORT Performed at [facility]    Report Status PENDING  Incomplete  Anaerobic culture     Status: None (Preliminary result)   Collection Time: 03/02/15 10:26 AM  Result Value Ref Range Status   Specimen Description TISSUE LUNG LEFT  Final   Special Requests LUL  Final   Gram Stain   Final    RARE WBC PRESENT, PREDOMINANTLY MONONUCLEAR NO ORGANISMS SEEN Performed at [facility]    Culture   Final    NO ANAEROBES ISOLATED; CULTURE IN PROGRESS FOR 5 DAYS Performed at [facility]    Report Status PENDING  Incomplete  Tissue culture     Status: None (Preliminary result)   Collection Time: 03/02/15 10:26 AM  Result Value Ref Range Status   Specimen Description TISSUE LUNG LEFT  Final   Special Requests NONE  Final   Gram Stain   Final    RARE WBC PRESENT, PREDOMINANTLY MONONUCLEAR NO ORGANISMS SEEN Performed at [facility]    Culture   Final    NO GROWTH 1 DAY Performed at [facility]    Report Status PENDING  Incomplete  Fungus Culture with Smear     Status: None (Preliminary result)   Collection Time: 03/02/15 10:26 AM  Result Value Ref Range Status   Specimen Description TISSUE LUNG LEFT  Final   Special Requests LUL  Final   Fungal Smear   Final    NO YEAST OR FUNGAL ELEMENTS SEEN Performed at [facility]  Culture   Final    CULTURE IN PROGRESS FOR FOUR WEEKS Performed at [facility]    Report Status PENDING  Incomplete   Smears of lesion, no afb of fungal eleemants Small air leak, ct to water seal Final path still pending I have seen and examined [name] and agree with the above assessment  and plan.  Grace Isaac MD [facility] [phone] Office [phone] 03/04/2015 8:19 AM

## 2015-03-04 NOTE — Progress Notes (Signed)
CTS PM Rounds  Stable day, waiting for stepdown bed Pulmonary status stable with persistent small air leak Filed Vitals:   03/03/15 2300  BP:   Pulse: 87  Temp:   Resp: 22

## 2015-03-04 NOTE — Care Management Note (Signed)
Case Management Note  Patient Details  Name: [name] MRN: [id number] Date of Birth: 1940-10-20  Subjective/Objective:    Pt lives with spouse @ [facility].  Spouse states he will be available to provide 24/7 assistance to pt when she discharges.  Two sons are also supportive.                    Expected Discharge Plan:  Home/Self Care  Discharge planning Services  CM Consult  Status of Service:  In process, will continue to follow  Girard Cooter, RN 03/04/2015, 11:43 AM

## 2015-03-04 NOTE — Op Note (Signed)
NAMEDEBORH, [name] NO.:  [id number]  MEDICAL RECORD NO.:  [id number]  LOCATION:  2S01C                        FACILITY:  [facility]  PHYSICIAN:  Lanelle Bal, MD    DATE OF BIRTH:  17-Jul-1940  DATE OF PROCEDURE:  03/02/2015 DATE OF DISCHARGE:                              OPERATIVE REPORT   PREOPERATIVE DIAGNOSIS:  Left upper lobe lung mass.  POSTOPERATIVE DIAGNOSIS:  Left upper lobe lung mass.  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  PROCEDURE PERFORMED:  Video bronchoscopy, left video-assisted thoracoscopy, minithoracotomy, wedge resection of left upper lobe.  SURGEON:  Lanelle Bal, M.D.  FIRST ASSISTANT:  Lars Pinks, PA.  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 Recently, the patient had a chest x-ray, which demonstrated a new left upper lobe lung nodule, which had not previously been noted.  Further evaluation confirmed that this was a hypermetabolic nodule approximately 1.6 cm in size without any associated nodes.  There was incidental 7 mm possible nodule and also in the left upper lobe, which was not evident on the PET scan.  Mass itself was not immediately located close to the aorta.  In reviewing with the patient's with a new nodule and in a location that was technically difficult for percutaneous needle biopsy in a patient who formerly had been a light smoker, but quit in 1960.  We recommended proceeding with resection.  The patient agreed and signed informed consent.  She had clearance from Cardiology.  Risks and options were discussed in detail.  DESCRIPTION OF PROCEDURE:  The patient underwent general endotracheal anesthesia without incident.  The double-lumen endotracheal tube was placed.  Appropriate time-out was performed and then we proceeded with fiberoptic bronchoscopy through the double-lumen endotracheal tube. This  confirmed good position of the endotracheal tube and without any obvious endobronchial lesions.  The patient was then turned in lateral decubitus position with the left side up.  We made a small incision in the mid axillary line approximately the 4th intercostal space, and through this, a 30-degree videoscope was passed.  The lung was partially deflated and as anticipated, there were extensive adhesions of the left lung especially anteriorly, along the mammary bed.  To actually locate the mass, we extended incisions slightly and through a separate port site and through this small incision, we were able to free up the upper lobe and to be able to palpate the mass.  The superior segment was closed to the mass, but a separate long stapler was then used to complete the fissure between the mass in the superior segment of the lower lobe.  In order to the dissection especially anterior to allow the upper lobe to be free enough to read wedge the peripherally based lesion out, it took some time.  Then using a long stapler, a wedge resection of the mass was completed and the specimen submitted to Pathology.  We felt the lung carefully, the remaining upper lobe to determine if there was a second nodule; however, no definite nodule could be located.  Although, there was puckering on the pleural surface and suspicion.  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  We had Pathology send appropriate cultures on the specimen which had been submitted to Pathology in total.  We did not proceed with any further resection.  A single 28 chest tube was placed through the 1 port site, an On-Q device was tunneled subpleurally.  Posteriorly, the incision was then closed with 2 pericostal sutures through the drill holes in the lower rib.  The muscle layers were closed with interrupted 0 Vicryl running 3-0 Vicryl subcutaneous tissue, 3-0 subcuticular stitch in skin edges for  subcuticular stitch.  Dermabond was applied.  The lung nicely reinflated.  The patient was extubated in the operating room and transferred to the recovery room for further postoperative care.  Blood loss was minimal, less than 100 mL.  The patient tolerated the procedure without obvious complication.     Lanelle Bal, MD     EG/MEDQ  D:  03/03/2015  T:  03/04/2015  Job:  [id number]

## 2015-03-04 NOTE — Progress Notes (Signed)
Patient ID: [name], female   DOB: Jan 14, 1941, 74 y.o.   MRN: [id number] EVENING ROUNDS NOTE :     [address]       [place] [address]             [phone]                 2 Days Post-Op Procedure(s) (LRB): BRONCHOSCOPY (N/A) VIDEO ASSISTED THORACOSCOPY (VATS), MINI THORACOTOMY, LEFT UPPER LOBE WEDGE, TAKE DOWN OF INTERNAL MAMMARY LESIONS, PLACEMENT OF ON-Q PUMP (Left)  Total Length of Stay:  LOS: 2 days  BP 129/59 mmHg  Pulse 81  Temp(Src) 98.3 F (36.8 C) (Oral)  Resp 20  Ht 5' 3.5" (1.613 m)  Wt 152 lb (68.947 kg)  BMI 26.50 kg/m2  SpO2 91%  .Intake/Output      11/03 0701 - 11/04 0700   P.O. 720   I.V. (mL/kg) 110 (1.6)   IV Piggyback    Total Intake(mL/kg) 830 (12)   Urine (mL/kg/hr) 1101 (1.3)   Stool 1 (0)   Chest Tube 30 (0)   Total Output 1132   Net -302         . sodium chloride 10 mL/hr at 03/04/15 0800  . bupivacaine ON-Q pain pump       Lab Results  Component Value Date   WBC 19.0* 03/04/2015   HGB 11.7* 03/04/2015   HCT 36.2 03/04/2015   PLT 298 03/04/2015   GLUCOSE 121* 03/04/2015   CHOL 168 11/26/2014   TRIG 111.0 11/26/2014   HDL 75.10 11/26/2014   LDLDIRECT 162.1 10/28/2009   LDLCALC 71 11/26/2014   ALT 74* 03/04/2015   AST 81* 03/04/2015   NA 138 03/04/2015   K 4.3 03/04/2015   CL 105 03/04/2015   CREATININE 1.18* 03/04/2015   BUN 21* 03/04/2015   CO2 26 03/04/2015   TSH 0.93 11/26/2014   INR 1.00 02/26/2015   Stable waiting for bed to transfer to  [place] [phone] Office [phone] 03/04/2015 7:34 PM

## 2015-03-05 ENCOUNTER — Inpatient Hospital Stay (HOSPITAL_COMMUNITY): Payer: Medicare Other

## 2015-03-05 LAB — GLUCOSE, CAPILLARY
Glucose-Capillary: 172 mg/dL — ABNORMAL HIGH (ref 65–99)
Glucose-Capillary: 53 mg/dL — ABNORMAL LOW (ref 65–99)
Glucose-Capillary: 97 mg/dL (ref 65–99)

## 2015-03-05 NOTE — Care Management Note (Signed)
Case Management Note  Patient Details  Name: KYNNADI WALLOCH MRN: [id number] Date of Birth: 1940/08/10  Subjective/Objective:       Pt was admitted with lung mass         Action/Plan:  Pt is independent from the Independent Living at [facility] with husband.  Pt plans to return home post discharge.  CM will continue to monitor for disposition needs   Expected Discharge Date:   (pending)               Expected Discharge Plan:  Home/Self Care (Independent Living at [facility] with husband)  In-House Referral:     Discharge planning Services  CM Consult  Post Acute Care Choice:    Choice offered to:     DME Arranged:    DME Agency:     HH Arranged:    [place] Agency:     Status of Service:  In process, will continue to follow  Medicare Important Message Given:  Yes-second notification given Date Medicare IM Given:    Medicare IM give by:    Date Additional Medicare IM Given:    Additional Medicare Important Message give by:     If discussed at [place] of Stay Meetings, dates discussed:    Additional Comments:  Maryclare Labrador, RN 03/05/2015, 3:03 PM

## 2015-03-05 NOTE — Progress Notes (Signed)
Attempted to call report to 2W 

## 2015-03-05 NOTE — Progress Notes (Signed)
Patient ambulated to 2W 03 with monitor, SCD's with pt, staff in room to receive.

## 2015-03-05 NOTE — Progress Notes (Signed)
Pt's blood glucose at 1609 was 53. She was asymptomatic. 4 oz of orange juice given and pt ate dinner. Blood glucose rechecked at 1648 was 97. Dr Servando Snare was notified. He gave orders to continue blood glucose checks, but to discontinue sliding scale insulin.

## 2015-03-05 NOTE — Care Management Important Message (Signed)
Important Message  Patient Details  Name: [name] MRN: [id number] Date of Birth: Sep 16, 1940   Medicare Important Message Given:  Yes-second notification given    Nathen May 03/05/2015, 10:26 AM

## 2015-03-05 NOTE — Progress Notes (Signed)
On transfer to 2W03, pt alert and oriented. She denied any pain. Family members present. Pt was oriented to bedroom and use of call bell. She verbalized understanding.

## 2015-03-05 NOTE — Progress Notes (Signed)
Wasted 10 cc fentanyl in sink witnessed by [employer].

## 2015-03-05 NOTE — Discharge Summary (Signed)
Physician Discharge Summary  Patient ID: [name] MRN: [id number] DOB/AGE: [birth year] 74 y.o.  Admit date: 03/02/2015 Discharge date: 03/07/2015  Admission Diagnoses:  Patient Active Problem List   Diagnosis Date Noted  . Lung mass 03/02/2015  . Pre-operative cardiovascular examination 02/21/2015  . Dyspnea 02/16/2015  . Solitary pulmonary nodule 02/15/2015  . TIA (transient ischemic attack) 12/31/2014  . Diarrhea 11/26/2014  . Well adult exam 11/26/2014  . Seasonal allergies 12/31/2013  . Splenic artery aneurysm ([place]) 04/07/2013  . Hyperlipidemia with target LDL less than 70   . Pernicious anemia 09/04/2012  . 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 Long term (current) use of anticoagulants 11/08/2010  . Rheumatoid arthritis ([place]) 06/23/2010  . ARTHRALGIA 11/26/2009  . 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 Hypothyroidism 12/08/2006  . MACULAR DEGENERATION 12/08/2006  . Essential hypertension 12/08/2006  . HIATAL HERNIA 12/08/2006  . RENAL FAILURE, CHRONIC 12/08/2006  . ASPLENIA 12/08/2006  . Atherosclerosis of coronary artery without angina pectoris 01/01/1995  . 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) 12/22/1994    Class: History of  . 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 Lung mass 03/02/2015  . Pre-operative cardiovascular examination 02/21/2015  . Dyspnea 02/16/2015  . Solitary pulmonary nodule 02/15/2015  . TIA (transient ischemic attack) 12/31/2014  . Diarrhea 11/26/2014  . Well adult exam 11/26/2014  . Seasonal allergies 12/31/2013  . Splenic artery aneurysm ([place]) 04/07/2013  . Hyperlipidemia with target LDL less than 70   . Pernicious anemia 09/04/2012  . 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 Long term (current) use of  anticoagulants 11/08/2010  . Rheumatoid arthritis ([place]) 06/23/2010  . ARTHRALGIA 11/26/2009  . 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 Hypothyroidism 12/08/2006  . MACULAR DEGENERATION 12/08/2006  . Essential hypertension 12/08/2006  . HIATAL HERNIA 12/08/2006  . RENAL FAILURE, CHRONIC 12/08/2006  . ASPLENIA 12/08/2006  . Atherosclerosis of coronary artery without angina pectoris 01/01/1995  . 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) 12/22/1994    Class: History of  . 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  She presented with complaints of cough and cold symptoms.  During that workup CXR was obtained and showed 2 pulmonary nodules in the are of the left upper lobe.  She smoked in the past but quit in 1960.  Further workup with CT scan confirmed the presence of a 1.5 x 1.6 cm left upper lob nodule and a 7 mm left upper lobe nodule.  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  Due to these findings she was referred to [facility] for surgical evaluation.  She was evaluated by Dr. Servando Snare who felt the patient should undergo VATS procedure with resection of the left upper lobe nodule.  The risks and benefits of the procedure were explained to the patient and she was agreeable to proceed.     Hospital Course:   [name] presented to [facility] on 03/02/2015.  She was taken to the  operating room and underwent Video Bronchoscopy, Left VATs, Mini Thoracotomy, and wedge resection left upper lobe.  She tolerated the procedure, was extubated and taken to the SICU in stable condition.  During her stay in the SICU the patients chest tube was weaned off suction.  She exhibited  a small air leak without pneumothorax on her chest xray.  This resolved on its own without further intervention and her chest tubes were later removed without difficulty.  Follow up CXR remains free from pneumothorax.  She has progressed without difficulty post operatively.  She is ambulating without difficulty.  She has been weaned off oxygen.  She is tolerating a diet.  She is felt medically stable for discharge 03/07/2015.    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   Significant Diagnostic Studies: PET CT  1. Hypermetabolic LEFT upper lobe pulmonary nodule is concerning for primary bronchogenic carcinoma. 2. Staging by FDG PET imaging T1a N0 M0 3. No evidence of mediastinal nodal metastasis or distant metastasis.  Treatments: surgery:   Video bronchoscopy, left video-assisted thoracoscopy, minithoracotomy, wedge resection of left upper lobe.  Disposition: 01-Home or Self Care   Discharge Medications:     Medication List    TAKE these medications        ALIGN 4 MG Caps  Take 1 capsule by mouth daily.     amoxicillin-clavulanate 875-125 MG tablet  Commonly known as:  AUGMENTIN  Take 1 tablet by mouth as needed.     atorvastatin 80 MG tablet  Commonly known as:  LIPITOR  Take 1 tablet (80 mg total) by mouth daily.     cetirizine 10 MG tablet  Commonly known as:  ZYRTEC  Take 10 mg by mouth daily.     clopidogrel 75 MG tablet  Commonly known as:  PLAVIX  Take 1 tablet (75 mg total) by mouth daily.     leflunomide 20 MG tablet  Commonly known as:  ARAVA  Take 1 tablet (20 mg total) by mouth every other day.     levothyroxine 88 MCG tablet  Commonly known as:  LEVOXYL  Take 1 tablet (88 mcg total) by mouth daily.     loperamide 2 MG tablet  Commonly known as:  IMODIUM A-D  Take 2 mg by mouth 4 (four) times daily as needed for diarrhea or loose stools.     losartan 25 MG tablet   Commonly known as:  COZAAR  Take 1 tablet (25 mg total) by mouth daily.     metoprolol succinate 25 MG 24 hr tablet  Commonly known as:  TOPROL-XL  Take 1 tablet (25 mg total) by mouth daily.     psyllium 58.6 % packet  Commonly known as:  METAMUCIL  Take 1 packet by mouth daily.     traMADol 50 MG tablet  Commonly known as:  ULTRAM  Take 1-2 tablets (50-100 mg total) by mouth every 6 (six) hours as needed (mild pain).       Follow-up Information    Follow up with Grace Isaac, MD On 03/22/2015.   Specialty:  Cardiothoracic Surgery   Why:  Appointment is at 2:00   Contact information:   [address] [place] [address] [phone]       Follow up with [facility] On 03/22/2015.   Why:  Please get CXR at 1:30   Contact information:   [facility]  SignedEllwood Handler 03/07/2015, 7:26 AM

## 2015-03-05 NOTE — Progress Notes (Signed)
Patient ID: [name], female   DOB: Jul 15, 1940, 74 y.o.   MRN: [id number] [facility] DAILY ICU PROGRESS NOTE                   [address]            [place], [address]          [phone]   3 Days Post-Op Procedure(s) (LRB): BRONCHOSCOPY (N/A) VIDEO ASSISTED THORACOSCOPY (VATS), MINI THORACOTOMY, LEFT UPPER LOBE WEDGE, TAKE DOWN OF INTERNAL MAMMARY LESIONS, PLACEMENT OF ON-Q PUMP (Left)  Total Length of Stay:  LOS: 3 days   Subjective: Feels well, no sob  Objective: Vital signs in last 24 hours: Temp:  [98.2 F (36.8 C)-98.5 F (36.9 C)] 98.4 F (36.9 C) (11/04 0400) Pulse Rate:  [72-96] 88 (11/04 0700) Cardiac Rhythm:  [-] Normal sinus rhythm (11/04 0400) Resp:  [16-26] 18 (11/04 0700) BP: (123-178)/(53-83) 178/73 mmHg (11/04 0400) SpO2:  [91 %-97 %] 91 % (11/04 0700)  Filed Weights   03/02/15 0549  Weight: 152 lb (68.947 kg)    Weight change:    Hemodynamic parameters for last 24 hours:    Intake/Output from previous day: 11/03 0701 - 11/04 0700 In: 1430 [P.O.:1200; I.V.:230] Out: [id number] [Urine:3101; Stool:1; Chest Tube:30]  Intake/Output this shift:    Current Meds: Scheduled Meds: . acetaminophen  1,000 mg Oral 4 times per day   Or  . acetaminophen (TYLENOL) oral liquid 160 mg/5 mL  1,000 mg Oral 4 times per day  . antiseptic oral rinse  7 mL Mouth Rinse BID  . aspirin EC  81 mg Oral Daily  . atorvastatin  80 mg Oral Daily  . bisacodyl  10 mg Oral Daily  . enoxaparin (LOVENOX) injection  30 mg Subcutaneous Q24H  . fentaNYL   Intravenous 6 times per day  . insulin aspart  0-24 Units Subcutaneous 4 times per day  . levothyroxine  88 mcg Oral QAC breakfast  . loratadine  10 mg Oral Daily  . losartan  25 mg Oral Daily  . metoprolol succinate  25 mg Oral Daily  . pantoprazole  40 mg Oral Daily  . psyllium  1 packet Oral Daily  . senna-docusate  1 tablet Oral QHS   Continuous Infusions: . sodium chloride 10 mL/hr at 03/05/15 0400  .  bupivacaine ON-Q pain pump     PRN Meds:.diphenhydrAMINE **OR** diphenhydrAMINE, naloxone **AND** sodium chloride, ondansetron (ZOFRAN) IV, potassium chloride, traMADol  General appearance: alert and cooperative Neurologic: intact Heart: regular rate and rhythm, S1, S2 normal, no murmur, click, rub or gallop Lungs: clear to auscultation bilaterally Abdomen: soft, non-tender; bowel sounds normal; no masses,  no organomegaly Extremities: extremities normal, atraumatic, no cyanosis or edema and Homans sign is negative, no sign of DVT Wound: no air leak  Lab Results: CBC: Recent Labs  03/03/15 0354 03/04/15 0440  WBC 18.4* 19.0*  HGB 11.0* 11.7*  HCT 33.0* 36.2  PLT 278 298   BMET:  Recent Labs  03/03/15 0354 03/04/15 0440  NA 140 138  K 4.5 4.3  CL 107 105  CO2 23 26  GLUCOSE 115* 121*  BUN 22* 21*  CREATININE 1.17* 1.18*  CALCIUM 9.1 9.5    PT/INR: No results for input(s): LABPROT, INR in the last 72 hours. Radiology: No results found.  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 - SEE COMMENT.  Recent Results (from the past 240 hour(s))  Surgical pcr screen     Status: None   Collection Time: 02/26/15  1:04 PM  Result Value Ref Range Status   MRSA, PCR NEGATIVE NEGATIVE Final   Staphylococcus aureus NEGATIVE NEGATIVE Final    Comment:        The Xpert SA Assay (FDA approved for NASAL specimens in patients over 58 years of age), is one component of a comprehensive surveillance program.  Test performance has been validated by [facility] for patients greater than or equal to 61 year old. It is not intended to diagnose infection nor to guide or monitor treatment.   AFB culture with smear     Status: None (Preliminary result)   Collection Time: 03/02/15 10:26 AM  Result Value Ref Range Status   Specimen Description TISSUE LUNG LEFT  Final   Special  Requests LUL  Final   Acid Fast Smear   Final    NO ACID FAST BACILLI SEEN Performed at [facility]    Culture   Final    CULTURE WILL BE EXAMINED FOR 6 WEEKS BEFORE ISSUING A FINAL REPORT Performed at [facility]    Report Status PENDING  Incomplete  Anaerobic culture     Status: None (Preliminary result)   Collection Time: 03/02/15 10:26 AM  Result Value Ref Range Status   Specimen Description TISSUE LUNG LEFT  Final   Special Requests LUL  Final   Gram Stain   Final    RARE WBC PRESENT, PREDOMINANTLY MONONUCLEAR NO ORGANISMS SEEN Performed at [facility]    Culture   Final    NO ANAEROBES ISOLATED; CULTURE IN PROGRESS FOR 5 DAYS Performed at [facility]    Report Status PENDING  Incomplete  Tissue culture     Status: None (Preliminary result)   Collection Time: 03/02/15 10:26 AM  Result Value Ref Range Status   Specimen Description TISSUE LUNG LEFT  Final   Special Requests NONE  Final   Gram Stain   Final    RARE WBC PRESENT, PREDOMINANTLY MONONUCLEAR NO ORGANISMS SEEN Performed at [facility]    Culture   Final    NO GROWTH 1 DAY Performed at [facility]    Report Status PENDING  Incomplete  Fungus Culture with Smear     Status: None (Preliminary result)   Collection Time: 03/02/15 10:26 AM  Result Value Ref Range Status   Specimen Description TISSUE LUNG LEFT  Final   Special Requests LUL  Final   Fungal Smear   Final    NO YEAST OR FUNGAL ELEMENTS SEEN Performed at [facility]    Culture   Final    CULTURE IN PROGRESS FOR FOUR WEEKS Performed at [facility]    Report Status PENDING  Incomplete    Assessment/Plan: S/P Procedure(s) (LRB): BRONCHOSCOPY (N/A) VIDEO ASSISTED THORACOSCOPY (VATS), MINI THORACOTOMY, LEFT UPPER LOBE WEDGE, TAKE DOWN OF INTERNAL MAMMARY LESIONS, PLACEMENT OF ON-Q PUMP (Left) Mobilize Plan for transfer to step-down: see transfer orders D/c ct today  Poss  home one or two days    Grace Isaac 03/05/2015 7:39 AM

## 2015-03-05 NOTE — Progress Notes (Signed)
Report to 2 W RN 

## 2015-03-05 NOTE — Progress Notes (Signed)
Attempted to call report to 2W RN.

## 2015-03-05 NOTE — Discharge Instructions (Signed)
Lung Resection, Care After °Refer to this sheet in the next few weeks. These instructions provide you with information on caring for yourself after your procedure. Your health care provider may also give you more specific instructions. Your treatment has been planned according to current medical practices, but problems sometimes occur. Call your health care provider if you have any problems or questions after your procedure. °WHAT TO EXPECT AFTER THE PROCEDURE °After your procedure, it is typical to have the following:  °· You may feel pain in your chest and throat. °· Patients may sometimes shiver or feel nauseous during recovery. °HOME CARE INSTRUCTIONS °· You may resume a normal diet and activities as directed by your health care provider. °· Do not use any tobacco products, including cigarettes, chewing tobacco, or electronic cigarettes. If you need help quitting, ask your health care provider. °· There are many different ways to close and cover an incision, including stitches, skin glue, and adhesive strips. Follow your health care provider's instructions on: °¨ Incision care. °¨ Bandage (dressing) changes and removal. °¨ Incision closure removal. °· Take medicines only as directed by your health care provider. °· Keep all follow-up visits as directed by your health care provider. This is important. °· Try to breathe deeply and cough as directed. Holding a pillow firmly over your ribs may help with discomfort. °· If you were given an incentive spirometer in the hospital, continue to use it as directed by your health care provider. °· Walk as directed by your health care provider. °· You may take a shower and gently wash the area of your incision with water and soap as directed by your health care provider. Do not use anything else to clean your incision except as directed by your health care provider. Do not take baths, swim, or use a hot tub until your health care provider approves. °SEEK MEDICAL CARE  IF: °· You notice redness, swelling, or increasing pain at the incision site. °· You are bleeding at the incision site. °· You see pus coming from the incision site. °· You notice a bad smell coming from the incision site or bandage. °· Your incision breaks open. °· You cough up blood or pus, or you develop a cough that produces bad-smelling sputum. °· You have pain or swelling in your legs. °· You have increasing pain that is not controlled with medicine. °· You have trouble managing any of the tubes that have been left in place after surgery. °· You have fever or chills. °SEEK IMMEDIATE MEDICAL CARE IF:  °· You have chest pain or an irregular or rapid heartbeat. °· You have dizzy episodes or faint. °· You have shortness of breath or difficulty breathing. °· You have persistent nausea or vomiting. °· You have a rash. °  °This information is not intended to replace advice given to you by your health care provider. Make sure you discuss any questions you have with your health care provider. °  °Document Released: 11/04/2004 Document Revised: 05/08/2014 Document Reviewed: 06/06/2013 °Elsevier Interactive Patient Education ©2016 [employer] Inc[employer] ° °

## 2015-03-06 ENCOUNTER — Inpatient Hospital Stay (HOSPITAL_COMMUNITY): Payer: Medicare Other

## 2015-03-06 LAB — BASIC METABOLIC PANEL
Anion gap: 10 (ref 5–15)
BUN: 20 mg/dL (ref 6–20)
CO2: 28 mmol/L (ref 22–32)
Calcium: 9.3 mg/dL (ref 8.9–10.3)
Chloride: 102 mmol/L (ref 101–111)
Creatinine, Ser: 1.14 mg/dL — ABNORMAL HIGH (ref 0.44–1.00)
GFR calc Af Amer: 53 mL/min — ABNORMAL LOW (ref >60)
GFR calc non Af Amer: 46 mL/min — ABNORMAL LOW (ref >60)
Glucose, Bld: 97 mg/dL (ref 65–99)
Potassium: 3.9 mmol/L (ref 3.5–5.1)
Sodium: 140 mmol/L (ref 135–145)

## 2015-03-06 LAB — GLUCOSE, CAPILLARY
Glucose-Capillary: 111 mg/dL — ABNORMAL HIGH (ref 65–99)
Glucose-Capillary: 120 mg/dL — ABNORMAL HIGH (ref 65–99)
Glucose-Capillary: 94 mg/dL (ref 65–99)

## 2015-03-06 LAB — CBC
HCT: 33.2 % — ABNORMAL LOW (ref 36.0–46.0)
Hemoglobin: 10.8 g/dL — ABNORMAL LOW (ref 12.0–15.0)
MCH: 29.9 pg (ref 26.0–34.0)
MCHC: 32.5 g/dL (ref 30.0–36.0)
MCV: 92 fL (ref 78.0–100.0)
Platelets: 277 10*3/uL (ref 150–400)
RBC: 3.61 MIL/uL — ABNORMAL LOW (ref 3.87–5.11)
RDW: 15.9 % — ABNORMAL HIGH (ref 11.5–15.5)
WBC: 12.5 10*3/uL — ABNORMAL HIGH (ref 4.0–10.5)

## 2015-03-06 LAB — TISSUE CULTURE: Culture: NO GROWTH

## 2015-03-06 MED ORDER — ENOXAPARIN SODIUM 40 MG/0.4ML ~~LOC~~ SOLN
40.0000 mg | SUBCUTANEOUS | Status: DC
  Administered 2015-03-06: 40 mg via SUBCUTANEOUS
  Filled 2015-03-06: qty 0.4

## 2015-03-06 MED ORDER — TRAMADOL HCL 50 MG PO TABS: 50.0000 mg | ORAL_TABLET | Freq: Four times a day (QID) | ORAL | Status: DC | PRN

## 2015-03-06 NOTE — Progress Notes (Addendum)
      [address]       [place] [address]             [phone]      4 Days Post-Op Procedure(s) (LRB): BRONCHOSCOPY (N/A) VIDEO ASSISTED THORACOSCOPY (VATS), MINI THORACOTOMY, LEFT UPPER LOBE WEDGE, TAKE DOWN OF INTERNAL MAMMARY LESIONS, PLACEMENT OF ON-Q PUMP (Left)   Subjective:  [name] is resting comfortably.  She has no complaints this morning.  She is ambulating without difficulty.  + BM  Objective: Vital signs in last 24 hours: Temp:  [97.9 F (36.6 C)-98.3 F (36.8 C)] 97.9 F (36.6 C) (11/05 0613) Pulse Rate:  [86-112] 101 (11/05 [id number]) Cardiac Rhythm:  [-] Normal sinus rhythm (11/04 2000) Resp:  [16-23] 18 (11/05 0613) BP: (102-163)/(40-83) 116/40 mmHg (11/05 0613) SpO2:  [91 %-96 %] 94 % (11/05 0613)  Intake/Output from previous day: 11/04 0701 - 11/05 0700 In: 670 [P.O.:660; I.V.:10] Out: 300 [Urine:300]  General appearance: alert, cooperative and no distress Heart: regular rate and rhythm Lungs: clear to auscultation bilaterally Abdomen: soft, non-tender; bowel sounds normal; no masses,  no organomegaly Extremities: extremities normal, atraumatic, no cyanosis or edema Wound: clean and dry  Lab Results:  Recent Labs  03/04/15 0440 03/06/15 0257  WBC 19.0* 12.5*  HGB 11.7* 10.8*  HCT 36.2 33.2*  PLT 298 277   BMET:  Recent Labs  03/04/15 0440 03/06/15 0257  NA 138 140  K 4.3 3.9  CL 105 102  CO2 26 28  GLUCOSE 121* 97  BUN 21* 20  CREATININE 1.18* 1.14*  CALCIUM 9.5 9.3    PT/INR: No results for input(s): LABPROT, INR in the last 72 hours. ABG    Component Value Date/Time   PHART 7.340* 03/03/2015 0355   HCO3 21.5 03/03/2015 0355   TCO2 22.7 03/03/2015 0355   ACIDBASEDEF 3.4* 03/03/2015 0355   O2SAT 92.6 03/03/2015 0355   CBG (last 3)   Recent Labs  03/05/15 1648 03/06/15 0013 03/06/15 0557  GLUCAP 97 111* 94    Assessment/Plan: S/P Procedure(s) (LRB): BRONCHOSCOPY (N/A) VIDEO ASSISTED  THORACOSCOPY (VATS), MINI THORACOTOMY, LEFT UPPER LOBE WEDGE, TAKE DOWN OF INTERNAL MAMMARY LESIONS, PLACEMENT OF ON-Q PUMP (Left)  1. Pulm- off oxygen, follow up CXR with small left apical pneumothorax, sub-q air is stable 2. CV- remains hemodynamically stable, BP is improved- continue home lopressor, cozaar 3. Dispo- patient stable, small apical pneumothorax post chest tube removal- will discuss discharge with staff today vs. Tomorrow after repeat film   LOS: 4 days    BARRETT, ERIN 03/06/2015  Home in am if CXR ok patient examined and medical record reviewed,agree with above note. Tharon Aquas Trigt III 03/06/2015

## 2015-03-07 ENCOUNTER — Inpatient Hospital Stay (HOSPITAL_COMMUNITY): Payer: Medicare Other

## 2015-03-07 LAB — ANAEROBIC CULTURE

## 2015-03-07 LAB — GLUCOSE, CAPILLARY: Glucose-Capillary: 90 mg/dL (ref 65–99)

## 2015-03-07 NOTE — Progress Notes (Signed)
Discussed with the patient and her husband, all questioned fully answered. Discharge instruction included when to call the MD, signs of infection, medications, and pain. Follow up appointments listed on discharge paperwork. Given paper prescription for Ultram. She will call me if any problems arise.  Fritz Pickerel, RN

## 2015-03-07 NOTE — Progress Notes (Signed)
      [address]       ,[place] [address]             [phone]      5 Days Post-Op Procedure(s) (LRB): BRONCHOSCOPY (N/A) VIDEO ASSISTED THORACOSCOPY (VATS), MINI THORACOTOMY, LEFT UPPER LOBE WEDGE, TAKE DOWN OF INTERNAL MAMMARY LESIONS, PLACEMENT OF ON-Q PUMP (Left)   Subjective:  Ms. Pressman has no complaints this morning.  Objective: Vital signs in last 24 hours: Temp:  [97.6 F (36.4 C)-98.8 F (37.1 C)] 97.6 F (36.4 C) (11/06 0604) Pulse Rate:  [59-108] 59 (11/06 0604) Cardiac Rhythm:  [-] Normal sinus rhythm (11/05 1930) Resp:  [18] 18 (11/06 0604) BP: (122-158)/(43-73) 158/60 mmHg (11/06 0604) SpO2:  [94 %-95 %] 95 % (11/06 0604)  Intake/Output from previous day: 11/05 0701 - 11/06 0700 In: 720 [P.O.:720] Out: -   General appearance: alert, cooperative and no distress Heart: regular rate and rhythm Lungs: clear to auscultation bilaterally Abdomen: soft, non-tender; bowel sounds normal; no masses,  no organomegaly Wound: clean and dry  Lab Results:  Recent Labs  03/06/15 0257  WBC 12.5*  HGB 10.8*  HCT 33.2*  PLT 277   BMET:  Recent Labs  03/06/15 0257  NA 140  K 3.9  CL 102  CO2 28  GLUCOSE 97  BUN 20  CREATININE 1.14*  CALCIUM 9.3    PT/INR: No results for input(s): LABPROT, INR in the last 72 hours. ABG    Component Value Date/Time   PHART 7.340* 03/03/2015 0355   HCO3 21.5 03/03/2015 0355   TCO2 22.7 03/03/2015 0355   ACIDBASEDEF 3.4* 03/03/2015 0355   O2SAT 92.6 03/03/2015 0355   CBG (last 3)   Recent Labs  03/06/15 0557 03/06/15 1128 03/07/15 0604  GLUCAP 94 120* 90    Assessment/Plan: S/P Procedure(s) (LRB): BRONCHOSCOPY (N/A) VIDEO ASSISTED THORACOSCOPY (VATS), MINI THORACOTOMY, LEFT UPPER LOBE WEDGE, TAKE DOWN OF INTERNAL MAMMARY LESIONS, PLACEMENT OF ON-Q PUMP (Left)  1. Pulm- no acute issues, CXR pneumothorax has resolved 2. CV- Remains hemodynamically stable 3. Dispo- patient is stable  will d/c home today    LOS: 5 days    Ahmed Prima, ERIN 03/07/2015

## 2015-03-07 NOTE — Plan of Care (Signed)
Problem: Discharge Progression Outcomes Goal: Pneumonia & Flu vaccines given if indicated Outcome: Not Applicable Date Met:  36/68/15 Pt refused flu and pneumonia vaccine. Education completed.

## 2015-03-19 ENCOUNTER — Other Ambulatory Visit: Payer: Self-pay | Admitting: Cardiothoracic Surgery

## 2015-03-19 DIAGNOSIS — R918 Other nonspecific abnormal finding of lung field: Secondary | ICD-10-CM

## 2015-03-22 ENCOUNTER — Ambulatory Visit (INDEPENDENT_AMBULATORY_CARE_PROVIDER_SITE_OTHER): Payer: Self-pay | Admitting: Physician Assistant

## 2015-03-22 ENCOUNTER — Ambulatory Visit
Admission: RE | Admit: 2015-03-22 | Discharge: 2015-03-22 | Disposition: A | Discharge status: Home / Self Care | Payer: Medicare Other | Source: Ambulatory Visit | Attending: Cardiothoracic Surgery | Admitting: Cardiothoracic Surgery

## 2015-03-22 VITALS — BP 114/62 | HR 59 | Resp 16 | Ht 63.5 in | Wt 152.0 lb

## 2015-03-22 DIAGNOSIS — R911 Solitary pulmonary nodule: Secondary | ICD-10-CM

## 2015-03-22 DIAGNOSIS — R918 Other nonspecific abnormal finding of lung field: Secondary | ICD-10-CM

## 2015-03-22 DIAGNOSIS — J8410 Pulmonary fibrosis, unspecified: Secondary | ICD-10-CM

## 2015-03-22 DIAGNOSIS — J984 Other disorders of lung: Secondary | ICD-10-CM

## 2015-03-22 MED ORDER — TRAMADOL HCL 50 MG PO TABS: 50.0000 mg | ORAL_TABLET | Freq: Four times a day (QID) | ORAL | Status: DC | PRN

## 2015-03-22 NOTE — Progress Notes (Signed)
[address]       [place], [address]             [phone]          HPI: Patient returns for routine postoperative follow-up having undergone left VATS/mini-thoracotomy, left upper lobe wedge resection by Dr. Servando Snare on 03/02/2015.  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 The patient's postoperative course was generally uneventful and she was able to be discharged home on 03/07/2015.    Since hospital discharge, the patient has progressed well.  She continues to have incisional pain at night that awakens her from sleep, but is not taking any pain medications during the day.  She denies cough or dyspnea.  She is walking regularly and increasing her activity as tolerated. Appetite is good.  Overall, she has no complaints today.    Current Outpatient Prescriptions  Medication Sig Dispense Refill  . amoxicillin-clavulanate (AUGMENTIN) 875-125 MG tablet Take 1 tablet by mouth as needed.    [name] atorvastatin (LIPITOR) 80 MG tablet Take 1 tablet (80 mg total) by mouth daily. 90 tablet 3  . cetirizine (ZYRTEC) 10 MG tablet Take 10 mg by mouth daily.    . clopidogrel (PLAVIX) 75 MG tablet Take 1 tablet (75 mg total) by mouth daily. 90 tablet 3  . leflunomide (ARAVA) 20 MG tablet Take 1 tablet (20 mg total) by mouth every other day. 15 tablet 6  . levothyroxine (LEVOXYL) 88 MCG tablet Take 1 tablet (88 mcg total) by mouth daily. 90 tablet 3  . loperamide (IMODIUM A-D) 2 MG tablet Take 2 mg by mouth 4 (four) times daily as needed for diarrhea or loose stools.    [name] losartan (COZAAR) 25 MG tablet Take 1 tablet (25 mg total) by mouth daily. 90 tablet 3  . metoprolol succinate (TOPROL-XL) 25 MG 24 hr tablet Take 1 tablet (25 mg total) by mouth daily. 90 tablet 3  . Probiotic Product (ALIGN) 4 MG CAPS Take 1 capsule by mouth daily. 30 capsule 1  . psyllium (METAMUCIL) 58.6 % packet Take 1 packet by mouth daily.    . traMADol (ULTRAM) 50 MG tablet Take 1-2 tablets  (50-100 mg total) by mouth every 6 (six) hours as needed (mild pain). 30 tablet 0   No current facility-administered medications for this visit.     Physical Exam: BP 114/62 HR 59 Resp 16 Wounds: Clean and dry without erythema or drainage Heart: regular rate and rhythm Lungs: Clear to auscultation    Diagnostic Tests: Chest xray: Dg Chest 2 View  03/22/2015  CLINICAL DATA:  Post VA ET es 03/02/2015. EXAM: CHEST  2 VIEW COMPARISON:  03/07/2015 FINDINGS: Prior CABG. Previously seen small left pneumothorax and subcutaneous air on the left have resolved. Mild hyperinflation. Scarring at the lung bases. No confluent opacity or effusion. No acute bony abnormality. IMPRESSION: Resolution of the previously seen left pneumothorax and subcutaneous air. Mild hyperinflation/COPD. Bibasilar scarring. No active disease. Electronically Signed   By: Rolm Baptise M.D.   On: 03/22/2015 14:10       Assessment/Plan: The patient is overall progressing well status post left VATS/min-thoracotomy for left upper lobe wedge resection.  Pathology was negative for malignancy and all cultures are negative to date.  She continues to have some pain at night requiring Tramadol, and has requested a refill today. I have given her a prescription for #30. She may continue to increase her activity as tolerated and may drive as  long as she is not taking the pain meds during the day.  We will see her back in 2-3 weeks for a recheck or sooner if she develops any problems in the interim.

## 2015-03-29 LAB — FUNGUS CULTURE W SMEAR: Fungal Smear: NONE SEEN

## 2015-04-01 ENCOUNTER — Encounter: Payer: Self-pay | Admitting: Internal Medicine

## 2015-04-01 ENCOUNTER — Ambulatory Visit (INDEPENDENT_AMBULATORY_CARE_PROVIDER_SITE_OTHER): Payer: Medicare Other | Admitting: Internal Medicine

## 2015-04-01 VITALS — BP 120/70 | HR 104 | Wt 154.0 lb

## 2015-04-01 DIAGNOSIS — R918 Other nonspecific abnormal finding of lung field: Secondary | ICD-10-CM

## 2015-04-01 DIAGNOSIS — R911 Solitary pulmonary nodule: Secondary | ICD-10-CM

## 2015-04-01 DIAGNOSIS — E038 Other specified hypothyroidism: Secondary | ICD-10-CM | POA: Diagnosis not present

## 2015-04-01 DIAGNOSIS — M069 Rheumatoid arthritis, unspecified: Secondary | ICD-10-CM

## 2015-04-01 DIAGNOSIS — E034 Atrophy of thyroid (acquired): Secondary | ICD-10-CM

## 2015-04-01 DIAGNOSIS — M2550 Pain in unspecified joint: Secondary | ICD-10-CM

## 2015-04-01 NOTE — Progress Notes (Signed)
Subjective:  Patient ID: [name], female    DOB: [birth year]  Age: 74 y.o. MRN: [id number]  CC: No chief complaint on file.   HPI CHERYN SALMONSON presents for hypothyroidism, HTN, OA. Thoracoscopy - no cancer on Bx.  Outpatient Prescriptions Prior to Visit  Medication Sig Dispense Refill  . atorvastatin (LIPITOR) 80 MG tablet Take 1 tablet (80 mg total) by mouth daily. 90 tablet 3  . cetirizine (ZYRTEC) 10 MG tablet Take 10 mg by mouth daily.    . clopidogrel (PLAVIX) 75 MG tablet Take 1 tablet (75 mg total) by mouth daily. 90 tablet 3  . leflunomide (ARAVA) 20 MG tablet Take 1 tablet (20 mg total) by mouth every other day. 15 tablet 6  . levothyroxine (LEVOXYL) 88 MCG tablet Take 1 tablet (88 mcg total) by mouth daily. 90 tablet 3  . loperamide (IMODIUM A-D) 2 MG tablet Take 2 mg by mouth 4 (four) times daily as needed for diarrhea or loose stools.    [name] losartan (COZAAR) 25 MG tablet Take 1 tablet (25 mg total) by mouth daily. 90 tablet 3  . metoprolol succinate (TOPROL-XL) 25 MG 24 hr tablet Take 1 tablet (25 mg total) by mouth daily. 90 tablet 3  . Probiotic Product (ALIGN) 4 MG CAPS Take 1 capsule by mouth daily. 30 capsule 1  . psyllium (METAMUCIL) 58.6 % packet Take 1 packet by mouth daily.    . traMADol (ULTRAM) 50 MG tablet Take 1-2 tablets (50-100 mg total) by mouth every 6 (six) hours as needed (mild pain). 30 tablet 0  . amoxicillin-clavulanate (AUGMENTIN) 875-125 MG tablet Take 1 tablet by mouth as needed.     No facility-administered medications prior to visit.    ROS Review of Systems  Constitutional: Negative for chills, activity change, appetite change, fatigue and unexpected weight change.  HENT: Negative for congestion, mouth sores and sinus pressure.   Eyes: Negative for visual disturbance.  Respiratory: Negative for cough and chest tightness.   Gastrointestinal: Negative for nausea and abdominal pain.  Genitourinary: Negative for frequency, difficulty  urinating and vaginal pain.  Musculoskeletal: Positive for arthralgias. Negative for back pain and gait problem.  Skin: Negative for pallor and rash.  Neurological: Negative for dizziness, tremors, weakness, numbness and headaches.  Psychiatric/Behavioral: Negative for confusion and sleep disturbance.    Objective:  BP 120/70 mmHg  Pulse 104  Wt 154 lb (69.854 kg)  SpO2 95%  BP Readings from Last 3 Encounters:  04/01/15 120/70  03/22/15 114/62  03/07/15 158/62    Wt Readings from Last 3 Encounters:  04/01/15 154 lb (69.854 kg)  03/22/15 152 lb (68.947 kg)  03/02/15 152 lb (68.947 kg)    Physical Exam  Constitutional: She appears well-developed. No distress.  HENT:  Head: Normocephalic.  Right Ear: External ear normal.  Left Ear: External ear normal.  Nose: Nose normal.  Mouth/Throat: Oropharynx is clear and moist.  Eyes: Conjunctivae are normal. Pupils are equal, round, and reactive to light. Right eye exhibits no discharge. Left eye exhibits no discharge.  Neck: Normal range of motion. Neck supple. No JVD present. No tracheal deviation present. No thyromegaly present.  Cardiovascular: Normal rate, regular rhythm and normal heart sounds.   Pulmonary/Chest: No stridor. No respiratory distress. She has no wheezes.  Abdominal: Soft. Bowel sounds are normal. She exhibits no distension and no mass. There is no tenderness. There is no rebound and no guarding.  Musculoskeletal: She exhibits tenderness. She exhibits no edema.  Lymphadenopathy:    She has no cervical adenopathy.  Neurological: She displays normal reflexes. No cranial nerve deficit. She exhibits normal muscle tone. Coordination normal.  Skin: No rash noted. No erythema.  Psychiatric: She has a normal mood and affect. Her behavior is normal. Judgment and thought content normal.  LS tender w/ROM L ear wax - removed Lab Results  Component Value Date   WBC 12.5* 03/06/2015   HGB 10.8* 03/06/2015   HCT 33.2*  03/06/2015   PLT 277 03/06/2015   GLUCOSE 97 03/06/2015   CHOL 168 11/26/2014   TRIG 111.0 11/26/2014   HDL 75.10 11/26/2014   LDLDIRECT 162.1 10/28/2009   LDLCALC 71 11/26/2014   ALT 74* 03/04/2015   AST 81* 03/04/2015   NA 140 03/06/2015   K 3.9 03/06/2015   CL 102 03/06/2015   CREATININE 1.14* 03/06/2015   BUN 20 03/06/2015   CO2 28 03/06/2015   TSH 0.93 11/26/2014   INR 1.00 02/26/2015    Dg Chest 2 View  03/22/2015  CLINICAL DATA:  Post VA ET es 03/02/2015. EXAM: CHEST  2 VIEW COMPARISON:  03/07/2015 FINDINGS: Prior CABG. Previously seen small left pneumothorax and subcutaneous air on the left have resolved. Mild hyperinflation. Scarring at the lung bases. No confluent opacity or effusion. No acute bony abnormality. IMPRESSION: Resolution of the previously seen left pneumothorax and subcutaneous air. Mild hyperinflation/COPD. Bibasilar scarring. No active disease. Electronically Signed   By: Rolm Baptise M.D.   On: 03/22/2015 14:10    Assessment & Plan:   There are no diagnoses linked to this encounter. I am having Ms. Mcelderry maintain her psyllium, leflunomide, clopidogrel, levothyroxine, losartan, metoprolol succinate, ALIGN, atorvastatin, cetirizine, amoxicillin-clavulanate, loperamide, and traMADol.  No orders of the defined types were placed in this encounter.     Follow-up: Return in about 4 months (around 07/31/2015) for a follow-up visit.  [name], MD

## 2015-04-01 NOTE — Progress Notes (Signed)
Pre visit review using our clinic review tool, if applicable. No additional management support is needed unless otherwise documented below in the visit note. 

## 2015-04-01 NOTE — Assessment & Plan Note (Signed)
Granuloma

## 2015-04-01 NOTE — Assessment & Plan Note (Signed)
Better  

## 2015-04-01 NOTE — Assessment & Plan Note (Signed)
On Arava 

## 2015-04-01 NOTE — Assessment & Plan Note (Signed)
On Levothroid 

## 2015-04-12 ENCOUNTER — Encounter: Payer: Self-pay | Admitting: Cardiothoracic Surgery

## 2015-04-12 VITALS — BP 134/80 | HR 54 | Resp 20 | Ht 63.5 in | Wt 154.0 lb

## 2015-04-13 ENCOUNTER — Encounter: Payer: Medicare Other | Admitting: Cardiothoracic Surgery

## 2015-04-13 ENCOUNTER — Emergency Department (HOSPITAL_COMMUNITY): Payer: Medicare Other

## 2015-04-13 ENCOUNTER — Emergency Department (HOSPITAL_COMMUNITY)
Admission: EM | Admit: 2015-04-13 | Discharge: 2015-04-13 | Disposition: A | Discharge status: Home / Self Care | Payer: Medicare Other | Attending: Emergency Medicine | Admitting: Emergency Medicine

## 2015-04-13 ENCOUNTER — Encounter (HOSPITAL_COMMUNITY): Payer: Self-pay | Admitting: Nurse Practitioner

## 2015-04-13 DIAGNOSIS — R079 Chest pain, unspecified: Secondary | ICD-10-CM

## 2015-04-13 DIAGNOSIS — Z792 Long term (current) use of antibiotics: Secondary | ICD-10-CM | POA: Diagnosis not present

## 2015-04-13 DIAGNOSIS — I129 Hypertensive chronic kidney disease with stage 1 through stage 4 chronic kidney disease, or unspecified chronic kidney disease: Secondary | ICD-10-CM | POA: Diagnosis not present

## 2015-04-13 DIAGNOSIS — Z87891 Personal history of nicotine dependence: Secondary | ICD-10-CM | POA: Insufficient documentation

## 2015-04-13 DIAGNOSIS — Z951 Presence of aortocoronary bypass graft: Secondary | ICD-10-CM | POA: Insufficient documentation

## 2015-04-13 DIAGNOSIS — I499 Cardiac arrhythmia, unspecified: Secondary | ICD-10-CM | POA: Diagnosis not present

## 2015-04-13 DIAGNOSIS — E785 Hyperlipidemia, unspecified: Secondary | ICD-10-CM | POA: Insufficient documentation

## 2015-04-13 DIAGNOSIS — Z8673 Personal history of transient ischemic attack (TIA), and cerebral infarction without residual deficits: Secondary | ICD-10-CM | POA: Diagnosis not present

## 2015-04-13 DIAGNOSIS — E039 Hypothyroidism, unspecified: Secondary | ICD-10-CM | POA: Insufficient documentation

## 2015-04-13 DIAGNOSIS — I2510 Atherosclerotic heart disease of native coronary artery without angina pectoris: Secondary | ICD-10-CM | POA: Diagnosis not present

## 2015-04-13 DIAGNOSIS — Z862 Personal history of diseases of the blood and blood-forming organs and certain disorders involving the immune mechanism: Secondary | ICD-10-CM | POA: Diagnosis not present

## 2015-04-13 DIAGNOSIS — Z79899 Other long term (current) drug therapy: Secondary | ICD-10-CM | POA: Insufficient documentation

## 2015-04-13 DIAGNOSIS — M069 Rheumatoid arthritis, unspecified: Secondary | ICD-10-CM | POA: Insufficient documentation

## 2015-04-13 DIAGNOSIS — J449 Chronic obstructive pulmonary disease, unspecified: Secondary | ICD-10-CM | POA: Diagnosis not present

## 2015-04-13 DIAGNOSIS — Z7902 Long term (current) use of antithrombotics/antiplatelets: Secondary | ICD-10-CM | POA: Diagnosis not present

## 2015-04-13 DIAGNOSIS — N189 Chronic kidney disease, unspecified: Secondary | ICD-10-CM | POA: Diagnosis not present

## 2015-04-13 LAB — CBC
HEMATOCRIT: 36.9 % (ref 36.0–46.0)
Hemoglobin: 11.7 g/dL — ABNORMAL LOW (ref 12.0–15.0)
MCH: 29.7 pg (ref 26.0–34.0)
MCHC: 31.7 g/dL (ref 30.0–36.0)
MCV: 93.7 fL (ref 78.0–100.0)
PLATELETS: 398 10*3/uL (ref 150–400)
RBC: 3.94 MIL/uL (ref 3.87–5.11)
RDW: 16 % — AB (ref 11.5–15.5)
WBC: 9.5 10*3/uL (ref 4.0–10.5)

## 2015-04-13 LAB — BASIC METABOLIC PANEL
Anion gap: 8 (ref 5–15)
BUN: 28 mg/dL — AB (ref 6–20)
CO2: 25 mmol/L (ref 22–32)
Calcium: 9.6 mg/dL (ref 8.9–10.3)
Chloride: 109 mmol/L (ref 101–111)
Creatinine, Ser: 1.32 mg/dL — ABNORMAL HIGH (ref 0.44–1.00)
GFR, EST AFRICAN AMERICAN: 44 mL/min — AB (ref >60)
GFR, EST NON AFRICAN AMERICAN: 38 mL/min — AB (ref >60)
Glucose, Bld: 118 mg/dL — ABNORMAL HIGH (ref 65–99)
POTASSIUM: 4.2 mmol/L (ref 3.5–5.1)
SODIUM: 142 mmol/L (ref 135–145)

## 2015-04-13 LAB — I-STAT TROPONIN, ED: Troponin i, poc: 0 ng/mL (ref 0.00–0.08)

## 2015-04-13 NOTE — ED Notes (Signed)
MD at bedside. 

## 2015-04-13 NOTE — Discharge Instructions (Signed)
You were evaluated in the emergency room today for chest tightness and slow heart rates at home. Your workup here was unremarkable. Your heart rate remained stable in the 80s-90s. Please call your cardiologist to schedule a follow-up for further evaluation of your symptoms as an outpatient. Please also follow up with your primary care provider. Return to the ER for new or worsening symptoms.    Please obtain all of your results from medical records or have your doctors office obtain the results - share them with your doctor - you should be seen at your doctors office in the next 2 days. Call today to arrange your follow up. Take the medications as prescribed. Please review all of the medicines and only take them if you do not have an allergy to them. Please be aware that if you are taking birth control pills, taking other prescriptions, ESPECIALLY ANTIBIOTICS may make the birth control ineffective - if this is the case, either do not engage in sexual activity or use alternative methods of birth control such as condoms until you have finished the medicine and your family doctor says it is OK to restart them. If you are on a blood thinner such as COUMADIN, be aware that any other medicine that you take may cause the coumadin to either work too much, or not enough - you should have your coumadin level rechecked in next 7 days if this is the case.  ?  It is also a possibility that you have an allergic reaction to any of the medicines that you have been prescribed - Everybody reacts differently to medications and while MOST people have no trouble with most medicines, you may have a reaction such as nausea, vomiting, rash, swelling, shortness of breath. If this is the case, please stop taking the medicine immediately and contact your physician.  ?  You should return to the ER if you develop severe or worsening symptoms.

## 2015-04-13 NOTE — ED Notes (Signed)
Patient left at this time with all belongings. 

## 2015-04-13 NOTE — ED Provider Notes (Signed)
CSN: [id number]     Arrival date & time 04/13/15  1848 History   First MD Initiated Contact with Patient 04/13/15 2045     Chief Complaint  Patient presents with  . Chest Pain   HPI  [name] is an 74 y.o. female with history of CAD (s/p CABG 1996), COPD, AAA (s/p surgical repair), CKD, who presents to the ED for evaluation of chest pain and arrhythmia. States that for the past week however she has had an intermittent substernal chest tightness. She denies pain, states that it feels more like a tightness. She states the feeling is intermittent and occurs randomly. It is not associated with exertion, position. Denies SOB or diaphoresis. Denies radiation of the sensation. Pt reports she separately has been noticing bradycardia for the past week as well. Pt states at baseline her HR is usually stable around 90. She states she was at PCP office last week and her HR was down to 52, which was low for her. She states she has a finger pulse ox at home and has been checking her HR for the past week, with readings as low as the 40s-50 at times and at other times at her usual 90-100. Pt states she is completely asymptomatic during these periods. STates they are not correlated with the feeling of chest tightness at all. Denies any weakness, lightheadedness, LOC. Denies fever, chills, N/V/D, abdominal pain. Of note, pt had partial lung resection last month for a lung mass (found to be granuloma) and has been healing well, scheduled to f/u with CT surgery next week.  Past Medical History  Diagnosis Date  . Hypothyroidism (acquired)     hypo  . Cervical disc disease   . Polymyalgia rheumatica (HCC)     2011 Dr. Charlestine Night  . Anemia   . Hyperlipidemia   . S/P CABG x 1 1996    CABG--LIMA-LAD for ostial LAD (not felt to be PCI amenable). EF NORMAL then; LIMA now atretic  . 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 Rheumatoid arthritis ([place])  2011    Dr.Truslow; fracture knees, hands and wrists -   . CAD in native artery 1996, 2002, 2005     Status post CABG x1 with LIMA-LAD for ostial LAD 90% stenosis --> down to 50% in 2002 and 30% in 2005.;  Atretic LIMA; Myoview 06/2010: Fixed anteroseptal, apical and inferoapical defect with moderate size. Most likely scar. Mild subendocardial ischemia. EF 71% LOW RISK.   [name] COPD (chronic obstructive pulmonary disease) ([place])   . Hypertension   . Stroke [facility]) 11-2014    TIA   . Abdominal aortic aneurysm (HCC)     REPAIRED IN 1993 BY [name]  AND HAS RECENTLY BEEN FOLLOWED BY [name]  . Shortness of breath dyspnea     with exertion  . Chronic kidney disease   . Urinary frequency    Past Surgical History  Procedure Laterality Date  . Splenectomy    . Cardiac catheterization  2005    (Most recent CATH) - ostial LAD lesion 20-30% (down from 90% initially). Atretic LIMA. Minimal disease the RCA and Circumflex system.  . Cervical spine surgery      plate 2008 Dr. Saintclair Halsted  . Carpal tunnel release    . Cataract extraction    . Hernia repair    . Bunionectomy  07/2011    right foot  . Coronary artery  bypass graft  1996    INCLUDED AN INTERNAL MAMMARY ARTERY TO THE LAD. EF WAS NORMAL  . 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 Eye surgery    . Nm myoview ltd  March 2012    Fixed anteroseptal, apical and inferoapical defect with moderate size. Most likely scar. Mild subendocardial ischemia. EF 71% LOW RISK.   . Bunionectomy    . Vagina polyp    . Video bronchoscopy N/A 03/02/2015    Procedure: BRONCHOSCOPY;  Surgeon: Grace Isaac, MD;  Location: [facility];  Service: Thoracic;  Laterality: N/A;  . Video assisted thoracoscopy (vats)/wedge resection Left 03/02/2015    Procedure: VIDEO ASSISTED THORACOSCOPY (VATS), MINI THORACOTOMY, LEFT UPPER LOBE WEDGE, TAKE DOWN OF INTERNAL MAMMARY LESIONS,  PLACEMENT OF ON-Q PUMP;  Surgeon: Grace Isaac, MD;  Location: [facility];  Service: Thoracic;  Laterality: Left;   Family History  Problem Relation Age of Onset  . Hypertension    . Coronary artery disease    . Asthma Neg Hx   . Colon cancer Neg Hx   . Hypertension Mother   . Diabetes Mother   . Heart disease Mother   . Hyperlipidemia Mother   . Stroke Father   . Hyperlipidemia Sister   . Hypertension Sister   . Hypertension Daughter   . Cancer Paternal Uncle     Deceased from cancer not sure of site  . Hypertension Son   . Hyperlipidemia Son   . Hyperlipidemia Son   . Hypertension Son   . Hyperlipidemia Son   . Hypertension Son    Social History  Substance Use Topics  . Smoking status: Former Smoker    Quit date: 12/04/1958  . Smokeless tobacco: Never Used  . Alcohol Use: 0.6 - 1.8 oz/week    1-3 Standard drinks or equivalent per week   OB History    No data available     Review of Systems  All other systems reviewed and are negative.     Allergies  Aspirin; Codeine; and Nitroglycerin  Home Medications   Prior to Admission medications   Medication Sig Start Date End Date Taking? Authorizing Provider  amoxicillin-clavulanate (AUGMENTIN) 875-125 MG tablet Take 1 tablet by mouth as needed.    Historical Provider, MD  atorvastatin (LIPITOR) 80 MG tablet Take 1 tablet (80 mg total) by mouth daily. 02/01/15   Aleksei Plotnikov V, MD  cetirizine (ZYRTEC) 10 MG tablet Take 10 mg by mouth daily.    Historical Provider, MD  clopidogrel (PLAVIX) 75 MG tablet Take 1 tablet (75 mg total) by mouth daily. 07/02/14   Aleksei Plotnikov V, MD  leflunomide (ARAVA) 20 MG tablet Take 1 tablet (20 mg total) by mouth every other day. 02/26/14   Aleksei Plotnikov V, MD  levothyroxine (LEVOXYL) 88 MCG tablet Take 1 tablet (88 mcg total) by mouth daily. 07/02/14   Aleksei Plotnikov V, MD  loperamide (IMODIUM A-D) 2 MG tablet Take 2 mg by mouth 4 (four) times daily as needed for diarrhea or  loose stools.    Historical Provider, MD  losartan (COZAAR) 25 MG tablet Take 1 tablet (25 mg total) by mouth daily. 07/02/14   Aleksei Plotnikov V, MD  metoprolol succinate (TOPROL-XL) 25 MG 24 hr tablet Take 1 tablet (25 mg total) by mouth daily. 07/02/14   Aleksei Plotnikov V, MD  Probiotic Product (ALIGN) 4 MG CAPS Take 1  capsule by mouth daily. 11/26/14   Aleksei Plotnikov V, MD  psyllium (METAMUCIL) 58.6 % packet Take 1 packet by mouth daily.    Historical Provider, MD  traMADol (ULTRAM) 50 MG tablet Take 1-2 tablets (50-100 mg total) by mouth every 6 (six) hours as needed (mild pain). 03/22/15   Gina L Collins, PA-C   BP 185/86 mmHg  Pulse 94  Temp(Src) 97.7 F (36.5 C) (Oral)  Resp 17  SpO2 95% Physical Exam  Constitutional: She is oriented to person, place, and time. No distress.  HENT:  Right Ear: External ear normal.  Left Ear: External ear normal.  Nose: Nose normal.  Mouth/Throat: Oropharynx is clear and moist. No oropharyngeal exudate.  Eyes: Conjunctivae and EOM are normal. Pupils are equal, round, and reactive to light.  Neck: Normal range of motion. Neck supple.  Cardiovascular: Normal rate, regular rhythm, normal heart sounds and intact distal pulses.   Pulmonary/Chest: Effort normal and breath sounds normal. No respiratory distress. She has no wheezes. She exhibits no tenderness.  Abdominal: Soft. Bowel sounds are normal. She exhibits no distension. There is no tenderness.  Musculoskeletal: She exhibits no edema or tenderness.  Neurological: She is alert and oriented to person, place, and time. No cranial nerve deficit.  Skin: Skin is warm and dry. She is not diaphoretic.  Psychiatric: She has a normal mood and affect.  Nursing note and vitals reviewed.  Filed Vitals:   04/13/15 1854 04/13/15 2056 04/13/15 2138 04/13/15 2222  BP: 185/86 149/84 166/70 173/80  Pulse: 94 90 84 82  Temp: 97.7 F (36.5 C)     TempSrc: Oral     Resp: 17 16 17 17   SpO2: 95% 100% 98% 98%      ED Course  Procedures (including critical care time) Labs Review Labs Reviewed  BASIC METABOLIC PANEL - Abnormal; Notable for the following:    Glucose, Bld 118 (*)    BUN 28 (*)    Creatinine, Ser 1.32 (*)    GFR calc non Af Amer 38 (*)    GFR calc Af Amer 44 (*)    All other components within normal limits  CBC - Abnormal; Notable for the following:    Hemoglobin 11.7 (*)    RDW 16.0 (*)    All other components within normal limits  I-STAT TROPOININ, ED    Imaging Review Dg Chest 2 View  04/13/2015  CLINICAL DATA:  Chest tightness for 1 week, had LEFT lung nodule removed on 03/02/2015 and prior CABG, hypertension, former smoker, coronary artery disease EXAM: CHEST  2 VIEW COMPARISON:  03/22/2015 FINDINGS: Upper normal heart size post median sternotomy and by history CABG. Atherosclerotic calcification aorta. LEFT upper lobe staple lines from prior lung resection. Pulmonary vascularity otherwise normal. Emphysematous and bronchitic changes consistent with COPD with minimal bibasilar scarring. No acute infiltrate, pleural effusion or pneumothorax. Bones unremarkable. IMPRESSION: COPD changes with minimal bibasilar scarring. Post CABG. No acute abnormalities. Electronically Signed   By: Lavonia Dana M.D.   On: 04/13/2015 19:59   I have personally reviewed and evaluated these images and lab results as part of my medical decision-making.   EKG Interpretation   Date/Time:  Tuesday April 13 2015 18:52:24 EST Ventricular Rate:  94 PR Interval:  174 QRS Duration: 70 QT Interval:  334 QTC Calculation: 417 R Axis:   90 Text Interpretation:  Sinus rhythm with Premature atrial complexes  Rightward axis Borderline ECG agree. no change Confirmed by Johnney Killian, MD,  Jeannie Done [phone])  on 04/13/2015 10:26:41 PM      MDM   Final diagnoses:  Chest pain, unspecified chest pain type  Irregular heart rate   Chest pain/tightness for one week. Troponin negative. EKG unchanged. Cbc, bmp,  and CXR unremarkable.HEART score 3. I doubt ACS. REgarding pt's reported bradycardia, her HR has been in the 80s-90s in the ED. Instructed pt to schedule cardiology f/u for further evaluation. ER return precautions given.   Anne Ng, PA-C 04/14/15 [place], MD 04/19/15 [phone]

## 2015-04-13 NOTE — ED Notes (Signed)
She c/o 1 week history irregular pulse rate and intermittent chest pain. She no noted activity at onset. shes also had some SOB. She denies fevers, cough, nausea, weakness, near-syncope. A&Ox4, resp e/u

## 2015-04-14 ENCOUNTER — Telehealth: Payer: Self-pay | Admitting: Cardiology

## 2015-04-14 LAB — AFB CULTURE WITH SMEAR (NOT AT ARMC): Acid Fast Smear: NONE SEEN

## 2015-04-14 NOTE — Telephone Encounter (Signed)
I did not find an order either. Called patient and let her know that we have no order at this time, but will call her if she needs to have a Holter monitor put on. Patient verbalized understanding and had no other questions at this time. Will forward message to Dr. Ellyn Hack to see if he wants to order a 24 hour Holter monitor. From ED note patient is to follow-up with Dr. Servando Snare on 04/20/15.

## 2015-04-14 NOTE — Telephone Encounter (Signed)
Pt called in stating that she went to the ED last night for chest pain. She was told them to contact our office to have a 24- hour monitor placed. I do not see an order nor do I see anything in a discharge note that indicated she is to have a monitor placed. Can you check behind me on this and contact the pt?  Thank you

## 2015-04-19 NOTE — Telephone Encounter (Signed)
Not sure that a 24 or 48 hr monitor would help - probably an event monitor would be more likely to capture any problems.  We may need to evaluate the chest tightness as well -- should have appt scheduled. - if not with me, with APP.  Leonie Man, MD

## 2015-04-19 NOTE — Telephone Encounter (Signed)
Called patient with Dr. Allison Quarry recommendations. Patient verbalized understanding and has an appointment with Rosaria Ferries PA this week.

## 2015-04-20 ENCOUNTER — Ambulatory Visit (INDEPENDENT_AMBULATORY_CARE_PROVIDER_SITE_OTHER): Payer: Self-pay | Admitting: Cardiothoracic Surgery

## 2015-04-20 ENCOUNTER — Encounter: Payer: Self-pay | Admitting: Cardiothoracic Surgery

## 2015-04-20 VITALS — BP 112/70 | HR 100 | Resp 20 | Ht 63.5 in | Wt 154.0 lb

## 2015-04-20 DIAGNOSIS — J8410 Pulmonary fibrosis, unspecified: Secondary | ICD-10-CM

## 2015-04-20 DIAGNOSIS — J984 Other disorders of lung: Secondary | ICD-10-CM

## 2015-04-20 NOTE — Progress Notes (Signed)
[address]       [place] [address]             [phone]      Eyonna E Strack [place] Medical Record [id number] Date of Birth: 07/04/1940  Referring: Tanda Rockers, MD Primary Care: [name], MD  Chief Complaint:   POST OP FOLLOW UP 03/02/2015 OPERATIVE REPORT PREOPERATIVE DIAGNOSIS: Left upper lobe lung mass POSTOPERATIVE DIAGNOSIS: Left upper lobe lung mass. 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 PROCEDURE PERFORMED: Video bronchoscopy, left video-assisted thoracoscopy, minithoracotomy, wedge resection of left upper lobe. SURGEON: Lanelle Bal, M.D . History of Present Illness:     Patient has been well postoperatively as far as her surgical resection. She did note that she had a period of bradycardia and [facility], she has an appointment with cardiology for follow-up tomorrow.      Past Medical History  Diagnosis Date  . Hypothyroidism (acquired)     hypo  . Cervical disc disease   . Polymyalgia rheumatica (HCC)     2011 Dr. Charlestine Night  . Anemia   . Hyperlipidemia   . S/P CABG x 1 1996    CABG--LIMA-LAD for ostial LAD (not felt to be PCI amenable). EF NORMAL then; LIMA now atretic  . 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 Rheumatoid arthritis ([place]) 2011    Dr.Truslow; fracture knees, hands and wrists -   . CAD in native artery 1996, 2002, 2005     Status post CABG x1 with LIMA-LAD for ostial LAD 90% stenosis --> down to 50% in 2002 and 30% in 2005.;  Atretic LIMA; Myoview 06/2010: Fixed anteroseptal, apical and inferoapical defect with moderate size. Most likely scar. Mild subendocardial ischemia. EF 71% LOW RISK.   [name] COPD (chronic obstructive pulmonary disease) ([place])   . Hypertension   . Stroke [facility]) 11-2014    TIA   . Abdominal aortic aneurysm (HCC)     REPAIRED IN 1993 BY [name]  AND HAS RECENTLY BEEN FOLLOWED BY DR  VAN TRIGHT  . Shortness of breath dyspnea     with exertion  . Chronic kidney disease   . Urinary frequency      History  Smoking status  . Former Smoker  . Quit date: 12/04/1958  Smokeless tobacco  . Never Used    History  Alcohol Use  . 0.6 - 1.8 oz/week  . 1-3 Standard drinks or equivalent per week     Allergies  Allergen Reactions  . Aspirin Other (See Comments)    Hurts stomach  . Codeine Nausea And Vomiting  . Nitroglycerin Other (See Comments)    Heart rate drops    Current Outpatient Prescriptions  Medication Sig Dispense Refill  . acetaminophen (TYLENOL) 500 MG tablet Take 500 mg by mouth every 6 (six) hours as needed for mild pain.    [name] amoxicillin-clavulanate (AUGMENTIN) 875-125 MG tablet Take 1 tablet by mouth as needed (ongoing therapy).     [name] atorvastatin (LIPITOR) 80 MG tablet Take 1 tablet (80 mg total) by mouth daily. 90 tablet 3  . cetirizine (ZYRTEC) 10 MG tablet Take 10 mg by mouth daily.    . clopidogrel (PLAVIX) 75 MG tablet Take 1 tablet (75 mg total) by mouth daily. 90 tablet 3  . leflunomide (ARAVA) 20 MG tablet Take 1 tablet (20 mg total) by mouth  every other day. 15 tablet 6  . levothyroxine (LEVOXYL) 88 MCG tablet Take 1 tablet (88 mcg total) by mouth daily. 90 tablet 3  . loperamide (IMODIUM A-D) 2 MG tablet Take 2 mg by mouth 4 (four) times daily as needed for diarrhea or loose stools.    [name] losartan (COZAAR) 25 MG tablet Take 1 tablet (25 mg total) by mouth daily. 90 tablet 3  . metoprolol succinate (TOPROL-XL) 25 MG 24 hr tablet Take 1 tablet (25 mg total) by mouth daily. 90 tablet 3  . Probiotic Product (ALIGN) 4 MG CAPS Take 1 capsule by mouth daily. 30 capsule 1  . psyllium (METAMUCIL) 58.6 % packet Take 1 packet by mouth daily.    . traMADol (ULTRAM) 50 MG tablet Take 1-2 tablets (50-100 mg total) by mouth every 6 (six) hours as needed (mild pain). 30 tablet 0   No current facility-administered medications for this visit.        Physical Exam: BP 112/70 mmHg  Pulse 100  Resp 20  Ht 5' 3.5" (1.613 m)  Wt 154 lb (69.854 kg)  BMI 26.85 kg/m2  SpO2 98%  General appearance: alert, cooperative, appears stated age and no distress Neurologic: intact Heart: regular rate and rhythm, S1, S2 normal, no murmur, click, rub or gallop Lungs: clear to auscultation bilaterally Abdomen: soft, non-tender; bowel sounds normal; no masses,  no organomegaly Extremities: extremities normal, atraumatic, no cyanosis or edema and Homans sign is negative, no sign of DVT Wound: Her left chest incision and chest tube sites are all well-healed   Diagnostic Studies & Laboratory data:     Recent Radiology Findings:   Dg Chest 2 View  04/13/2015  CLINICAL DATA:  Chest tightness for 1 week, had LEFT lung nodule removed on 03/02/2015 and prior CABG, hypertension, former smoker, coronary artery disease EXAM: CHEST  2 VIEW COMPARISON:  03/22/2015 FINDINGS: Upper normal heart size post median sternotomy and by history CABG. Atherosclerotic calcification aorta. LEFT upper lobe staple lines from prior lung resection. Pulmonary vascularity otherwise normal. Emphysematous and bronchitic changes consistent with COPD with minimal bibasilar scarring. No acute infiltrate, pleural effusion or pneumothorax. Bones unremarkable. IMPRESSION: COPD changes with minimal bibasilar scarring. Post CABG. No acute abnormalities. Electronically Signed   By: Lavonia Dana M.D.   On: 04/13/2015 19:59   Dg Chest 2 View  03/22/2015  CLINICAL DATA:  Post VA ET es 03/02/2015. EXAM: CHEST  2 VIEW COMPARISON:  03/07/2015 FINDINGS: Prior CABG. Previously seen small left pneumothorax and subcutaneous air on the left have resolved. Mild hyperinflation. Scarring at the lung bases. No confluent opacity or effusion. No acute bony abnormality. IMPRESSION: Resolution of the previously seen left pneumothorax and subcutaneous air. Mild hyperinflation/COPD. Bibasilar scarring. No  active disease. Electronically Signed   By: Rolm Baptise M.D.   On: 03/22/2015 14:10     Recent Lab Findings: Lab Results  Component Value Date   WBC 9.5 04/13/2015   HGB 11.7* 04/13/2015   HCT 36.9 04/13/2015   PLT 398 04/13/2015   GLUCOSE 118* 04/13/2015   CHOL 168 11/26/2014   TRIG 111.0 11/26/2014   HDL 75.10 11/26/2014   LDLDIRECT 162.1 10/28/2009   LDLCALC 71 11/26/2014   ALT 74* 03/04/2015   AST 81* 03/04/2015   NA 142 04/13/2015   K 4.2 04/13/2015   CL 109 04/13/2015   CREATININE 1.32* 04/13/2015   BUN 28* 04/13/2015   CO2 25 04/13/2015   TSH 0.93 11/26/2014   INR 1.00 02/26/2015  Assessment / Plan:     Stable following recent resection of left upper lobe lung nodule, then 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 So far on cultures and smears no specific organisms have been identified. Patient has follow-up visit with cardiology tomorrow, follow-up to her recent ER visit for bradycardia Plan to see her back with a follow-up chest x-ray in 3 months      Grace Isaac MD      [address] [place] [address] Office [phone]   Beeper [phone]  04/20/2015 2:49 PM

## 2015-04-20 NOTE — Progress Notes (Deleted)
       [address]       [place] [address]             [phone]          HPI: Patient returns for routine postoperative follow-up having undergone left VATS/mini-thoracotomy, left upper lobe wedge resection by Dr. Servando Snare on 03/02/2015.  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 The patient's postoperative course was generally uneventful and she was able to be discharged home on 03/07/2015.    Since hospital discharge, the patient has progressed well.  She continues to have incisional pain at night that awakens her from sleep, but is not taking any pain medications during the day.  She denies cough or dyspnea.  She is walking regularly and increasing her activity as tolerated. Appetite is good.  Overall, she has no complaints today.    Current Outpatient Prescriptions  Medication Sig Dispense Refill  . acetaminophen (TYLENOL) 500 MG tablet Take 500 mg by mouth every 6 (six) hours as needed for mild pain.    [name] amoxicillin-clavulanate (AUGMENTIN) 875-125 MG tablet Take 1 tablet by mouth as needed (ongoing therapy).     [name] atorvastatin (LIPITOR) 80 MG tablet Take 1 tablet (80 mg total) by mouth daily. 90 tablet 3  . cetirizine (ZYRTEC) 10 MG tablet Take 10 mg by mouth daily.    . clopidogrel (PLAVIX) 75 MG tablet Take 1 tablet (75 mg total) by mouth daily. 90 tablet 3  . leflunomide (ARAVA) 20 MG tablet Take 1 tablet (20 mg total) by mouth every other day. 15 tablet 6  . levothyroxine (LEVOXYL) 88 MCG tablet Take 1 tablet (88 mcg total) by mouth daily. 90 tablet 3  . loperamide (IMODIUM A-D) 2 MG tablet Take 2 mg by mouth 4 (four) times daily as needed for diarrhea or loose stools.    [name] losartan (COZAAR) 25 MG tablet Take 1 tablet (25 mg total) by mouth daily. 90 tablet 3  . metoprolol succinate (TOPROL-XL) 25 MG 24 hr tablet Take 1 tablet (25 mg total) by mouth daily. 90 tablet 3  . Probiotic Product (ALIGN) 4 MG CAPS Take 1 capsule by mouth daily. 30  capsule 1  . psyllium (METAMUCIL) 58.6 % packet Take 1 packet by mouth daily.    . traMADol (ULTRAM) 50 MG tablet Take 1-2 tablets (50-100 mg total) by mouth every 6 (six) hours as needed (mild pain). 30 tablet 0   No current facility-administered medications for this visit.     Physical Exam: BP 114/62 HR 59 Resp 16 Wounds: Clean and dry without erythema or drainage Heart: regular rate and rhythm Lungs: Clear to auscultation    Diagnostic Tests: Chest xray: No results found.     Assessment/Plan: The patient is overall progressing well status post left VATS/min-thoracotomy for left upper lobe wedge resection.  Pathology was negative for malignancy and all cultures are negative to date.  She continues to have some pain at night requiring Tramadol, and has requested a refill today. I have given her a prescription for #30. She may continue to increase her activity as tolerated and may drive as long as she is not taking the pain meds during the day.  We will see her back in 2-3 weeks for a recheck or sooner if she develops any problems in the interim.

## 2015-04-22 ENCOUNTER — Encounter: Payer: Self-pay | Admitting: Physician Assistant

## 2015-04-22 ENCOUNTER — Ambulatory Visit (INDEPENDENT_AMBULATORY_CARE_PROVIDER_SITE_OTHER): Payer: Medicare Other | Admitting: Physician Assistant

## 2015-04-22 VITALS — BP 112/60 | HR 86 | Ht 63.5 in | Wt 154.1 lb

## 2015-04-22 DIAGNOSIS — R072 Precordial pain: Secondary | ICD-10-CM | POA: Diagnosis not present

## 2015-04-22 DIAGNOSIS — R002 Palpitations: Secondary | ICD-10-CM

## 2015-04-22 DIAGNOSIS — R0609 Other forms of dyspnea: Secondary | ICD-10-CM

## 2015-04-22 NOTE — Progress Notes (Signed)
Cardiology Office Note   Date:  04/22/2015   ID:  [name], DOB October 19, 1940, MRN [id number]  PCP:  [name], MD  Cardiologist:  Dr Nolon Stalls, PA-C   Chief Complaint  Patient presents with  . Follow-up    pt c/o chest tightness, started a couple of weeks ago  . Shortness of Breath    on exertion//had part of lung removed in November, benign nodule; had SOB before that  . Cough    has had since Saturday    History of Present Illness: [name] is a 74 y.o. female with a history of CABG with LIMA-LAD in 1996. The LIMA was atretic the last time she was cathed. Her last Myoview was in 2012 and was low risk with scar but no significant ischemia and an EF of 71%. She also has a history of CVA, AAA and repair, CKD and RA.  She is recovering from surgery to remove a cancerous lung nodule in November 2016.  [name] presents for evaluation of dyspnea on exertion and palpitations.  She has a history of dyspnea on exertion that started before her lung surgery. However, she doesn't feel like she ever really recovered from the lung surgery. The dyspnea on exertion still limits her significantly.  She says her baseline heart rate is about 90. She saw Dr. Alain Marion about 10 days ago and her heart rate was noted to be 52 which is low for her. She also states that she has noted her heart rate to be in the high 40s and up to over 100 on her home pulse ox. She has not felt any of this. She has never had palpitations. She has never had presyncope or syncope.  She has not had lower extremity edema, orthopnea or PND. She does not use salt heavily and does not feel she has extra fluid on board. She has not gained weight. She does not wheeze. She currently has a viral upper respiratory infection with nasal congestion and cough, but is not coughing up purulent sputum and has not had fevers or chills.  She has had some mild chest pain that is not severe and is not  exertional in nature. It was not associated with any of her shortness of breath. It resolved spontaneously. She is not having now.   Past Medical History  Diagnosis Date  . Hypothyroidism (acquired)     hypo  . Cervical disc disease   . Polymyalgia rheumatica (HCC)     2011 Dr. Charlestine Night  . Anemia   . Hyperlipidemia   . S/P CABG x 1 1996    CABG--LIMA-LAD for ostial LAD (not felt to be PCI amenable). EF NORMAL then; LIMA now atretic  . 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 Rheumatoid arthritis ([place]) 2011    Dr.Truslow; fracture knees, hands and wrists -   . CAD in native artery 1996, 2002, 2005     Status post CABG x1 with LIMA-LAD for ostial LAD 90% stenosis --> down to 50% in 2002 and 30% in 2005.;  Atretic LIMA; Myoview 06/2010: Fixed anteroseptal, apical and inferoapical defect with moderate size. Most likely scar. Mild subendocardial ischemia. EF 71% LOW RISK.   [name] COPD (chronic obstructive pulmonary disease) ([place])   . Hypertension   . Stroke [facility]) 11-2014    TIA   . Abdominal aortic aneurysm ([place])  REPAIRED IN 1993 BY [name]  AND HAS RECENTLY BEEN FOLLOWED BY [name]  . Shortness of breath dyspnea     with exertion  . Chronic kidney disease   . Urinary frequency     Past Surgical History  Procedure Laterality Date  . Splenectomy    . Cardiac catheterization  2005    (Most recent CATH) - ostial LAD lesion 20-30% (down from 90% initially). Atretic LIMA. Minimal disease the RCA and Circumflex system.  . Cervical spine surgery      plate 2008 Dr. Saintclair Halsted  . Carpal tunnel release    . Cataract extraction    . Hernia repair    . Bunionectomy  07/2011    right foot  . Coronary artery bypass graft  1996    INCLUDED AN INTERNAL MAMMARY ARTERY TO THE LAD. EF WAS NORMAL  . 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 Eye surgery    . Nm myoview ltd  March 2012    Fixed anteroseptal, apical and inferoapical defect with moderate size. Most likely scar. Mild subendocardial ischemia. EF 71% LOW RISK.   . Bunionectomy    . Vagina polyp    . Video bronchoscopy N/A 03/02/2015    Procedure: BRONCHOSCOPY;  Surgeon: Grace Isaac, MD;  Location: [facility];  Service: Thoracic;  Laterality: N/A;  . Video assisted thoracoscopy (vats)/wedge resection Left 03/02/2015    Procedure: VIDEO ASSISTED THORACOSCOPY (VATS), MINI THORACOTOMY, LEFT UPPER LOBE WEDGE, TAKE DOWN OF INTERNAL MAMMARY LESIONS, PLACEMENT OF ON-Q PUMP;  Surgeon: Grace Isaac, MD;  Location: [facility];  Service: Thoracic;  Laterality: Left;    Current Outpatient Prescriptions  Medication Sig Dispense Refill  . acetaminophen (TYLENOL) 500 MG tablet Take 500 mg by mouth every 6 (six) hours as needed for mild pain.    [name] amoxicillin-clavulanate (AUGMENTIN) 875-125 MG tablet Take 1 tablet by mouth as needed (ongoing therapy).     [name] atorvastatin (LIPITOR) 80 MG tablet Take 1 tablet (80 mg total) by mouth daily. 90 tablet 3  . cetirizine (ZYRTEC) 10 MG tablet Take 10 mg by mouth daily.    . clopidogrel (PLAVIX) 75 MG tablet Take 1 tablet (75 mg total) by mouth daily. 90 tablet 3  . leflunomide (ARAVA) 20 MG tablet Take 1 tablet (20 mg total) by mouth every other day. 15 tablet 6  . levothyroxine (LEVOXYL) 88 MCG tablet Take 1 tablet (88 mcg total) by mouth daily. 90 tablet 3  . loperamide (IMODIUM A-D) 2 MG tablet Take 2 mg by mouth 4 (four) times daily as needed for diarrhea or loose stools.    [name] losartan (COZAAR) 25 MG tablet Take 1 tablet (25 mg total) by mouth daily. 90 tablet 3  . metoprolol succinate (TOPROL-XL) 25 MG 24 hr tablet Take 1 tablet (25 mg total) by mouth daily. 90 tablet 3  . Probiotic Product (ALIGN) 4 MG CAPS Take 1 capsule by mouth daily. 30 capsule 1  . psyllium (METAMUCIL) 58.6 % packet Take 1 packet by mouth daily.    .  traMADol (ULTRAM) 50 MG tablet Take 1-2 tablets (50-100 mg total) by mouth every 6 (six) hours as needed (mild pain). 30 tablet 0   No current facility-administered medications for this visit.    Allergies:   Aspirin; Codeine; and Nitroglycerin    Social History:  The patient  reports that she  quit smoking about 56 years ago. She has never used smokeless tobacco. She reports that she drinks about 0.6 - 1.8 oz of alcohol per week. She reports that she does not use illicit drugs.   Family History:  The patient's family history includes Cancer in her paternal uncle; Diabetes in her mother; Heart disease in her mother; Hyperlipidemia in her mother, sister, son, son, and son; Hypertension in her daughter, mother, sister, son, son, and son; Stroke in her father. There is no history of Asthma or Colon cancer.    ROS:  Please see the history of present illness. All other systems are reviewed and negative.    PHYSICAL EXAM: VS:  BP 112/60 mmHg  Pulse 86  Ht 5' 3.5" (1.613 m)  Wt 154 lb 1.6 oz (69.899 kg)  BMI 26.87 kg/m2 , BMI Body mass index is 26.87 kg/(m^2). GEN: Well nourished, well developed, female in no acute distress HEENT: normal for age  Neck: Minimal JVD, no carotid bruit, no masses Cardiac: Slightly irregular rate and rhythm; no murmur, no rubs, or gallops Respiratory:  Few dry rales but good air exchange bilaterally, normal work of breathing GI: soft, nontender, nondistended, + BS MS: no deformity or atrophy; no edema; distal pulses are 2+ in all 4 extremities  Skin: warm and dry, no rash Neuro:  Strength and sensation are intact Psych: euthymic mood, full affect   EKG:  EKG is ordered today. The ekg ordered today demonstrates sinus rhythm, rate 86 with PACs The R-our interval with the PACs is a heart rate of approximately 120 The compensatory pause after the PACs would make a heart rate read in the 50s   Recent Labs: 11/26/2014: TSH 0.93 03/04/2015: ALT 74* 04/13/2015:  BUN 28*; Creatinine, Ser 1.32*; Hemoglobin 11.7*; Platelets 398; Potassium 4.2; Sodium 142    Lipid Panel    Component Value Date/Time   CHOL 168 11/26/2014 1044   TRIG 111.0 11/26/2014 1044   HDL 75.10 11/26/2014 1044   CHOLHDL 2 11/26/2014 1044   VLDL 22.2 11/26/2014 1044   LDLCALC 71 11/26/2014 1044   LDLDIRECT 162.1 10/28/2009 0857     Wt Readings from Last 3 Encounters:  04/22/15 154 lb 1.6 oz (69.899 kg)  04/20/15 154 lb (69.854 kg)  04/12/15 154 lb (69.854 kg)     Other studies Reviewed: Additional studies/ records that were reviewed today include: Office Notes, hospital records.  ASSESSMENT AND PLAN:  1.  Dyspnea on exertion: She does not have significant volume overload by exam. According to her last x-ray, she had COPD but does not use any inhalers or treat this in any way. She does not ever hear herself wheeze. I feel she might be a candidate for pulmonary rehabilitation. She will discuss this with Dr. Melvyn Novas. As she is not currently wheezing, we will not prescribe any medications for this.  2. Chest pain: Her symptoms are atypical and do not remind her of pre-CABG symptoms. She is not having any symptoms consistently with exertion. She was offered a stress test but wishes to avoid this for now. She is to continue her Plavix, statin, beta blocker and ACE inhibitor.  3. Irregular heart rate: She has recorded both bradycardia in the 40s and tachycardia over 100 on her home pulse ox while at rest. We will order an event monitor and follow up on those results. Her TSH has not been checked recently and we will check this.   Current medicines are reviewed at length with the patient today.  The patient does not have concerns regarding medicines.  The following changes have been made:  no change  Labs/ tests ordered today include:   Orders Placed This Encounter  Procedures  . TSH  . Cardiac event monitor  . EKG 12-Lead     Disposition:   FU with Dr. Ellyn Hack after the  event monitor  Signed, Lenoard Aden  04/22/2015 5:39 PM    [address], [place], [place]  [address] Phone: [phone]; Fax: [phone]

## 2015-04-22 NOTE — Patient Instructions (Signed)
Medication Instructions:  Please continue your current medications  Labwork: Your physician recommends that you return for lab work in [place].  Testing/Procedures: 1. Cardiac Event Monitor for 30 days - Your physician has recommended that you wear an event monitor. Event monitors are medical devices that record the heart's electrical activity. Doctors most often [place] these monitors to diagnose arrhythmias. Arrhythmias are problems with the speed or rhythm of the heartbeat. The monitor is a small, portable device. You can wear one while you do your normal daily activities. This is usually used to diagnose what is causing palpitations/syncope (passing out).  Follow-Up: Rosaria Ferries, PA-C, recommends that you schedule a follow-up appointment in 6 weeks with Dr Ellyn Hack.  If you need a refill on your cardiac medications before your next appointment, please call your pharmacy.

## 2015-04-27 ENCOUNTER — Encounter (INDEPENDENT_AMBULATORY_CARE_PROVIDER_SITE_OTHER): Payer: Medicare Other

## 2015-04-27 DIAGNOSIS — R002 Palpitations: Secondary | ICD-10-CM

## 2015-04-28 LAB — TSH: TSH: 1.459 u[IU]/mL (ref 0.350–4.500)

## 2015-04-29 ENCOUNTER — Encounter: Payer: Self-pay | Admitting: Surgery

## 2015-05-09 ENCOUNTER — Other Ambulatory Visit: Payer: Medicare Other

## 2015-05-09 ENCOUNTER — Inpatient Hospital Stay: Admission: RE | Admit: 2015-05-09 | Payer: Medicare Other | Source: Ambulatory Visit

## 2015-05-10 ENCOUNTER — Ambulatory Visit: Payer: Medicare Other | Admitting: Surgery

## 2015-05-12 ENCOUNTER — Encounter: Payer: Self-pay | Admitting: Surgery

## 2015-05-17 ENCOUNTER — Other Ambulatory Visit: Payer: Self-pay

## 2015-05-17 DIAGNOSIS — Z1231 Encounter for screening mammogram for malignant neoplasm of breast: Secondary | ICD-10-CM

## 2015-05-19 ENCOUNTER — Ambulatory Visit
Admission: RE | Admit: 2015-05-19 | Discharge: 2015-05-19 | Disposition: A | Discharge status: Home / Self Care | Payer: Medicare Other | Source: Ambulatory Visit | Attending: Surgery | Admitting: Surgery

## 2015-05-19 DIAGNOSIS — I701 Atherosclerosis of renal artery: Secondary | ICD-10-CM

## 2015-05-19 DIAGNOSIS — I773 Arterial fibromuscular dysplasia: Secondary | ICD-10-CM

## 2015-05-19 DIAGNOSIS — I728 Aneurysm of other specified arteries: Secondary | ICD-10-CM

## 2015-05-19 NOTE — Progress Notes (Signed)
This encounter was created in error - please disregard.

## 2015-05-21 ENCOUNTER — Encounter: Payer: Self-pay | Admitting: Surgery

## 2015-05-31 ENCOUNTER — Encounter: Payer: Self-pay | Admitting: Surgery

## 2015-05-31 ENCOUNTER — Ambulatory Visit (INDEPENDENT_AMBULATORY_CARE_PROVIDER_SITE_OTHER): Payer: Medicare Other | Admitting: Surgery

## 2015-05-31 VITALS — BP 122/71 | HR 86 | Temp 97.4°F | Resp 16 | Ht 63.5 in | Wt 155.0 lb

## 2015-05-31 DIAGNOSIS — I728 Aneurysm of other specified arteries: Secondary | ICD-10-CM | POA: Diagnosis not present

## 2015-05-31 NOTE — Progress Notes (Signed)
Filed Vitals:   05/31/15 0917 05/31/15 0925  BP: 141/75 122/71  Pulse: 87 86  Temp: 97.4 F (36.3 C)   TempSrc: Oral   Resp: 16   Height: 5' 3.5" (1.613 m)   Weight: 155 lb (70.308 kg)   SpO2: 94%

## 2015-05-31 NOTE — Progress Notes (Signed)
Patient name: [name] MRN: [id number] DOB: 07/30/1940 Sex: female     Chief Complaint  Patient presents with  . Re-evaluation    Splenic Artery Aneurysm  f/u      HISTORY OF PRESENT ILLNESS: The patient is back today because she recently underwent a workup for abdominal pain. I inherited her from Dr. Amedeo Plenty in 2011. 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 Leafy Kindle in 1996 for rupture. She has been followed with MR A. since that time to evaluate her mesenteric and splenic aneurysms. 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 She has been treated in the past with steroids for polyarteritis or fibromuscular disease. Currently she is not on steroids.    He does complain of some right upper thigh pain.  This occurs at night and is not necessarily associated with activity.  She denies abdominal pain or back pain   Past Medical History  Diagnosis Date  . Hypothyroidism (acquired)     hypo  . Cervical disc disease   . Polymyalgia rheumatica (HCC)     2011 Dr. Charlestine Night  . Anemia   . Hyperlipidemia   . S/P CABG x 1 1996    CABG--LIMA-LAD for ostial LAD (not felt to be PCI amenable). EF NORMAL then; LIMA now atretic  . 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 Rheumatoid arthritis ([place]) 2011    Dr.Truslow; fracture knees, hands and wrists -   . CAD in native artery 1996, 2002, 2005     Status post CABG x1 with LIMA-LAD for ostial LAD 90% stenosis --> down to 50% in 2002 and 30% in 2005.;  Atretic LIMA; Myoview 06/2010: Fixed anteroseptal, apical and inferoapical defect with moderate size. Most likely scar. Mild subendocardial ischemia. EF 71% LOW RISK.   [name] COPD (chronic obstructive pulmonary disease) ([place])   . Hypertension   . Stroke [facility]) 11-2014    TIA   . Abdominal aortic aneurysm  (HCC)     REPAIRED IN 1993 BY [name]  AND HAS RECENTLY BEEN FOLLOWED BY [name]  . Shortness of breath dyspnea     with exertion  . Chronic kidney disease   . Urinary frequency   . Myocardial infarction [facility]) Aug. 2016    TIA    Past Surgical History  Procedure Laterality Date  . Splenectomy    . Cardiac catheterization  2005    (Most recent CATH) - ostial LAD lesion 20-30% (down from 90% initially). Atretic LIMA. Minimal disease the RCA and Circumflex system.  . Cervical spine surgery      plate 2008 Dr. Saintclair Halsted  . Carpal tunnel release    . Cataract extraction    . Hernia repair    . Bunionectomy  07/2011    right foot  . Coronary artery bypass graft  1996    INCLUDED AN INTERNAL MAMMARY ARTERY TO THE LAD. EF WAS NORMAL  . 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 Eye surgery    . Nm myoview ltd  March 2012    Fixed anteroseptal, apical and inferoapical defect with moderate size. Most likely scar. Mild subendocardial ischemia. EF 71% LOW RISK.   . Bunionectomy    .  Vagina polyp    . Video bronchoscopy N/A 03/02/2015    Procedure: BRONCHOSCOPY;  Surgeon: Grace Isaac, MD;  Location: [place];  Service: Thoracic;  Laterality: N/A;  . Video assisted thoracoscopy (vats)/wedge resection Left 03/02/2015    Procedure: VIDEO ASSISTED THORACOSCOPY (VATS), MINI THORACOTOMY, LEFT UPPER LOBE WEDGE, TAKE DOWN OF INTERNAL MAMMARY LESIONS, PLACEMENT OF ON-Q PUMP;  Surgeon: Grace Isaac, MD;  Location: [place];  Service: Thoracic;  Laterality: Left;    Social History   Social History  . Marital Status: Married    Spouse Name: N/A  . Number of Children: 4  . Years of Education: N/A   Occupational History  . retired    Social History Main Topics  . Smoking status: Former Smoker    Quit date: 12/04/1958  . Smokeless tobacco: Never Used  . Alcohol Use: 0.6 - 1.8 oz/week    1-3  Standard drinks or equivalent per week  . Drug Use: No  . Sexual Activity: Not Currently   Other Topics Concern  . Not on file   Social History Narrative   Regular [facility]- [facility] at the [facility]  [place]indy ; [place]. GOES TO [place] 3 DAYS A WEEK FOR ABOUT 45 MIN. PER SESSION      Living at [facility] since dec 2015    Family History  Problem Relation Age of Onset  . Hypertension    . Coronary artery disease    . Asthma Neg Hx   . Colon cancer Neg Hx   . Hypertension Mother   . Diabetes Mother   . Heart disease Mother   . Hyperlipidemia Mother   . Stroke Father   . Hyperlipidemia Sister   . Hypertension Sister   . Hypertension Daughter   . Cancer Paternal Uncle     Deceased from cancer not sure of site  . Hypertension Son   . Hyperlipidemia Son   . Hyperlipidemia Son   . Hypertension Son   . Hyperlipidemia Son   . Hypertension Son     Allergies as of 05/31/2015 - Review Complete 05/31/2015  Allergen Reaction Noted  . Aspirin Other (See Comments)   . Codeine Nausea And Vomiting   . Nitroglycerin Other (See Comments) 11/08/2010    Current Outpatient Prescriptions on File Prior to Visit  Medication Sig Dispense Refill  . acetaminophen (TYLENOL) 500 MG tablet Take 500 mg by mouth every 6 (six) hours as needed for mild pain.    [name] amoxicillin-clavulanate (AUGMENTIN) 875-125 MG tablet Take 1 tablet by mouth as needed (ongoing therapy).     [name] atorvastatin (LIPITOR) 80 MG tablet Take 1 tablet (80 mg total) by mouth daily. 90 tablet 3  . cetirizine (ZYRTEC) 10 MG tablet Take 10 mg by mouth daily.    . clopidogrel (PLAVIX) 75 MG tablet Take 1 tablet (75 mg total) by mouth daily. 90 tablet 3  . leflunomide (ARAVA) 20 MG tablet Take 1 tablet (20 mg total) by mouth every other day. 15 tablet 6  . levothyroxine (LEVOXYL) 88 MCG tablet Take 1 tablet (88 mcg total) by mouth daily. 90 tablet 3  . loperamide (IMODIUM A-D) 2 MG tablet Take 2 mg by mouth  4 (four) times daily as needed for diarrhea or loose stools.    [name] losartan (COZAAR) 25 MG tablet Take 1 tablet (25 mg total) by mouth daily. 90 tablet 3  . metoprolol succinate (TOPROL-XL) 25 MG 24 hr tablet Take 1 tablet (  25 mg total) by mouth daily. 90 tablet 3  . Probiotic Product (ALIGN) 4 MG CAPS Take 1 capsule by mouth daily. 30 capsule 1  . psyllium (METAMUCIL) 58.6 % packet Take 1 packet by mouth daily.    . traMADol (ULTRAM) 50 MG tablet Take 1-2 tablets (50-100 mg total) by mouth every 6 (six) hours as needed (mild pain). 30 tablet 0   No current facility-administered medications on file prior to visit.     REVIEW OF SYSTEMS: Cardiovascular:  Positive for chest pain and shortness of breath with activity  Musculoskeletal: Positive for right hip and thigh pain  Neuro: negative Psyche: negative Constitutional: No fevers or chills  PHYSICAL EXAMINATION:   Vital signs are  Filed Vitals:   05/31/15 0917 05/31/15 0925  BP: 141/75 122/71  Pulse: 87 86  Temp: 97.4 F (36.3 C)   TempSrc: Oral   Resp: 16   Height: 5' 3.5" (1.613 m)   Weight: 155 lb (70.308 kg)   SpO2: 94%    Body mass index is 27.02 kg/(m^2). General: The patient appears their stated age. HEENT:  No gross abnormalities Pulmonary:  Non labored breathing Abdomen: Soft and non-tender Musculoskeletal: There are no major deformities. Neurologic: No focal weakness or paresthesias are detected, Skin: There are no ulcer or rashes noted. Psychiatric: The patient has normal affect. Cardiovascular: There is a regular rate and rhythm without significant murmur appreciated. Palpable pedal pulses   Diagnostic Studies  I have reviewed her MRI with the following findings: 1. Proximal celiac axis occlusion versus high-grade stenosis, poststenotic 10 mm Aneurysmal dilatation (previously 9 mm). 2. 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 3. 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 Assessment:  history of splenic artery aneurysm Plan:  there has been no interval change by MRI today.  This was done without contrast because of the patient's kidney function.  Her disease has been relatively stable over time.  I have recommended changing her follow-up to 2 years with a MRA or MR I, depending on what her renal function is.  Eldridge Abrahams, M.D. [facility] Office: [phone] Pager:  [phone]

## 2015-06-10 ENCOUNTER — Encounter: Payer: Self-pay | Admitting: Cardiology

## 2015-06-10 ENCOUNTER — Ambulatory Visit (INDEPENDENT_AMBULATORY_CARE_PROVIDER_SITE_OTHER): Payer: Medicare Other | Admitting: Cardiology

## 2015-06-10 VITALS — BP 118/70 | HR 76 | Ht 63.5 in | Wt 157.0 lb

## 2015-06-10 DIAGNOSIS — I2510 Atherosclerotic heart disease of native coronary artery without angina pectoris: Secondary | ICD-10-CM | POA: Diagnosis not present

## 2015-06-10 DIAGNOSIS — R0600 Dyspnea, unspecified: Secondary | ICD-10-CM

## 2015-06-10 DIAGNOSIS — Z79899 Other long term (current) drug therapy: Secondary | ICD-10-CM | POA: Diagnosis not present

## 2015-06-10 DIAGNOSIS — G459 Transient cerebral ischemic attack, unspecified: Secondary | ICD-10-CM

## 2015-06-10 DIAGNOSIS — Z951 Presence of aortocoronary bypass graft: Secondary | ICD-10-CM

## 2015-06-10 DIAGNOSIS — I10 Essential (primary) hypertension: Secondary | ICD-10-CM

## 2015-06-10 DIAGNOSIS — E785 Hyperlipidemia, unspecified: Secondary | ICD-10-CM | POA: Diagnosis not present

## 2015-06-10 DIAGNOSIS — I714 Abdominal aortic aneurysm, without rupture, unspecified: Secondary | ICD-10-CM

## 2015-06-10 NOTE — Progress Notes (Signed)
PCP: Sherry Kehr, MD  Clinic Note: Chief Complaint  Patient presents with  . Follow-up    CardioNet event monitor  . Coronary Artery Disease    HPI: [name] is a 75 y.o. female with a PMH below who presents today for annual follow-up of CAD status post LIMA and LAD in 1996. She is also a history of ruptured AAA status post open repair. 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 She is followed by [name] from [facility] for AAA/Mesenteric,Celiac & Splenic Artery disease. --> @ one time thought to be FMD versus polyarteritis --has been treated with steroids in the past as well.. Last visit with vascular surgery was in January 2016 -- she was noted to have stable aneurysmal changes on MRI. Follow-up catheterizations have shown an atretic LIMA with improved LAD flow with now 50% ostial LAD. She is a former smoker who quit in 1960. Referred to pulmonary medicine for new cough.  Cchest x-ray suggested pulmonary nodule.  [name] was last seen in late October 2016 -- following new Dx of Lung Ca.[name] She was doing well at that time.  She does not have any exertional dyspnea with baseline exertion, however if she were to go uphill or upstairs she may note a little bit of dyspnea, but this is not a new finding. She does water exercises with her husband to 3 times a week and walks around at friend's home.  - Saw [name] in December --> or DOE & Palpitations (noted to have bradycarrdia in 40s & tachy over 100s) --> event monitor. 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 - necrotizing LUL mass.  Recent Hospitalizations: for Bronchoscopy November   Studies Reviewed:  Heart Monitor Dec-Jan:  Highest heart rate 128 bpm, lowest 55 bpm. Average heart rate 78 bpm  Sinus rhythm and sinus tachycardia, no atrial fibrillation; occasional sinus arrhythmia  Occasional PACs - with occasional atrial  bigeminy  Rare PVCs  The patient had chest pain symptoms documented during episodes of frequent PACs with atrial bigeminy. This would suggest that the patient's symptoms are related to the PACs.   Interval History: [name] presents doing better & happy about no Cancer Dx.  No longer worrying about palpitations once she heard results of monitor. She noted brief tickling in her chest. Not paying attention anymore. Back to being active - exercises @ [employer] MWF in the pool & walks outside when weather is nice (at least 1/2 mile from apartment to dining hall).  No further TIA/Amaurosis fugax Sx.  NO syncope/near syncope. No further rapid/irregular heartbeats.  Will get SOB if walks up hill or couple flights of stairs -- no change. This is more than she had been doing in quite some time, therefore she actually is very stable from a cardiac standpoint.  No PND, orthopnea or edema. No palpitations, lightheadedness, dizziness, weakness or syncope/near syncope. No recurrent TIA/amaurosis fugax symptoms since August.. No claudication.  ROS: A comprehensive was performed. Review of Systems  Constitutional: Negative for malaise/fatigue (Just a little bit more scared to do things that she used to be.).  HENT: Positive for congestion (in the mornings). Negative for nosebleeds.   Respiratory: Negative for cough and sputum production.   Cardiovascular: Negative for palpitations and claudication.  Gastrointestinal: Negative for blood in stool and melena.  Genitourinary: Negative for hematuria.  Musculoskeletal: Positive for myalgias (R leg - from hip to below knee - back of leg).  Neurological: Negative for dizziness, weakness and headaches.       No recurrent TIA episodes as indicated in HPI  Psychiatric/Behavioral: Negative for depression.       Under a lot more stress now with all these new diagnoses.  All other systems reviewed and are negative.  Past Medical History  Diagnosis Date  .  Hypothyroidism (acquired)     hypo  . Cervical disc disease   . Polymyalgia rheumatica (HCC)     2011 Dr. Charlestine Night  . Anemia   . Hyperlipidemia   . S/P CABG x 1 1996    CABG--LIMA-LAD for ostial LAD (not felt to be PCI amenable). EF NORMAL then; LIMA now atretic  . 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 Rheumatoid arthritis ([place]) 2011    Dr.Truslow; fracture knees, hands and wrists -   . CAD in native artery 1996, 2002, 2005     Status post CABG x1 with LIMA-LAD for ostial LAD 90% stenosis --> down to 50% in 2002 and 30% in 2005.;  Atretic LIMA; Myoview 06/2010: Fixed anteroseptal, apical and inferoapical defect with moderate size. Most likely scar. Mild subendocardial ischemia. EF 71% LOW RISK.   [name] COPD (chronic obstructive pulmonary disease) ([place])   . Hypertension   . Stroke [facility]) 11-2014    TIA   . Abdominal aortic aneurysm (HCC)     REPAIRED IN 1993 BY [name]  AND HAS RECENTLY BEEN FOLLOWED BY [name]  . Shortness of breath dyspnea     with exertion  . Chronic kidney disease   . Urinary frequency   . Myocardial infarction [facility]) Aug. 2016    TIA    Past Surgical History  Procedure Laterality Date  . Splenectomy    . Cardiac catheterization  2005    (Most recent CATH) - ostial LAD lesion 20-30% (down from 90% initially). Atretic LIMA. Minimal disease the RCA and Circumflex system.  . Cervical spine surgery      plate 2008 Dr. Saintclair Halsted  . Carpal tunnel release    . Cataract extraction    . Hernia repair    . Bunionectomy  07/2011    right foot  . Coronary artery bypass graft  1996    INCLUDED AN INTERNAL MAMMARY ARTERY TO THE LAD. EF WAS NORMAL  . 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 Eye surgery    . Nm myoview ltd  March 2012    Fixed anteroseptal, apical and inferoapical defect with moderate  size. Most likely scar. Mild subendocardial ischemia. EF 71% LOW RISK.   . Bunionectomy    . Vagina polyp    . Video bronchoscopy N/A 03/02/2015    Procedure: BRONCHOSCOPY;  Surgeon: Grace Isaac, MD;  Location: [place];  Service: Thoracic;  Laterality: N/A;  . Video assisted thoracoscopy (vats)/wedge resection Left 03/02/2015    Procedure: VIDEO ASSISTED THORACOSCOPY (VATS), MINI THORACOTOMY, LEFT UPPER LOBE WEDGE, TAKE DOWN OF INTERNAL MAMMARY LESIONS, PLACEMENT OF ON-Q PUMP;  Surgeon: Grace Isaac, MD;  Location: [place];  Service: Thoracic;  Laterality: Left;  . Transthoracic echocardiogram  12/2014    [facility]: Normal LV size & function. EF 55-60%,    Prior to Admission medications   Medication Sig Start Date End Date Taking? Authorizing Provider  acetaminophen (TYLENOL) 500 MG  tablet Take 500 mg by mouth every 6 (six) hours as needed for mild pain.   Yes Historical Provider, MD  amoxicillin-clavulanate (AUGMENTIN) 875-125 MG tablet Take 1 tablet by mouth as needed (ongoing therapy). Reported on 06/10/2015   Yes Historical Provider, MD - for PRN due to s/p Splenectomy  atorvastatin (LIPITOR) 80 MG tablet Take 1 tablet (80 mg total) by mouth daily. 02/01/15  Yes Aleksei Plotnikov V, MD  cetirizine (ZYRTEC) 10 MG tablet Take 10 mg by mouth daily.   Yes Historical Provider, MD  clopidogrel (PLAVIX) 75 MG tablet Take 1 tablet (75 mg total) by mouth daily. 07/02/14  Yes Aleksei Plotnikov V, MD  leflunomide (ARAVA) 20 MG tablet Take 1 tablet (20 mg total) by mouth every other day. 02/26/14  Yes Aleksei Plotnikov V, MD  levothyroxine (LEVOXYL) 88 MCG tablet Take 1 tablet (88 mcg total) by mouth daily. 07/02/14  Yes Aleksei Plotnikov V, MD  loperamide (IMODIUM A-D) 2 MG tablet Take 2 mg by mouth 4 (four) times daily as needed for diarrhea or loose stools.   Yes Historical Provider, MD  losartan (COZAAR) 25 MG tablet Take 1 tablet (25 mg total) by mouth daily. 07/02/14  Yes Aleksei Plotnikov  V, MD  metoprolol succinate (TOPROL-XL) 25 MG 24 hr tablet Take 1 tablet (25 mg total) by mouth daily. 07/02/14  Yes Aleksei Plotnikov V, MD  Probiotic Product (ALIGN) 4 MG CAPS Take 1 capsule by mouth daily. 11/26/14  Yes Aleksei Plotnikov V, MD  psyllium (METAMUCIL) 58.6 % packet Take 1 packet by mouth daily.   Yes Historical Provider, MD  traMADol (ULTRAM) 50 MG tablet Take 1-2 tablets (50-100 mg total) by mouth every 6 (six) hours as needed (mild pain). 03/22/15  Yes Coolidge Breeze, PA-C    Allergies  Allergen Reactions  . Aspirin Other (See Comments)    Hurts stomach  . Codeine Nausea And Vomiting  . Nitroglycerin Other (See Comments)    Heart rate drops    Social History   Social History  . Marital Status: Married    Spouse Name: N/A  . Number of Children: 4  . Years of Education: N/A   Occupational History  . retired    Social History Main Topics  . Smoking status: Former Smoker    Quit date: 12/04/1958  . Smokeless tobacco: Never Used  . Alcohol Use: 0.6 - 1.8 oz/week    1-3 Standard drinks or equivalent per week  . Drug Use: No  . Sexual Activity: Not Currently   Other Topics Concern  . None   Social History Narrative   Regular [facility]- [facility] the [facility]  MARRIED -RETIRED ; [place]. GOES TO [place] 3 DAYS A WEEK FOR ABOUT 45 MIN. PER SESSION      Living at [facility] since dec 2015   family history includes Cancer in her paternal uncle; Diabetes in her mother; Heart disease in her mother; Hyperlipidemia in her mother, sister, son, son, and son; Hypertension in her daughter, mother, sister, son, son, and son; Stroke in her father. There is no history of Asthma or Colon cancer.   Wt Readings from Last 3 Encounters:  06/10/15 157 lb (71.215 kg)  05/31/15 155 lb (70.308 kg)  04/22/15 154 lb 1.6 oz (69.899 kg)    PHYSICAL EXAM BP 118/70 mmHg  Pulse 76  Ht 5' 3.5" (1.613 m)  Wt 157 lb (71.215 kg)  BMI 27.37 kg/m2  General  appearance: alert, cooperative,  appears stated age, no distress and Healthy-appearing. Answers questions appropriately HEENT: [place]/AT, EOMI, MMM, anicteric sclera  Neck: no adenopathy, no carotid bruit, no JVD and supple, symmetrical, trachea midline  Lungs: clear to auscultation bilaterally, normal percussion bilaterally and Nonlabored, and good air movement  Heart: regular rate and rhythm, S1, S2 normal, no murmur, click, rub or gallop and prominent apical impulse  Abdomen: soft, non-tender; bowel sounds normal; no masses, no organomegaly and Well healed abdominal aneurysm scar. No pulsatile mass. No obvious bruit  Extremities: extremities normal, atraumatic, no cyanosis or edema, varicose veins noted, venous stasis dermatitis noted and An extensive spider veins noted; more prominent on left than right  Pulses: 2+ and symmetric  Neurologic: Grossly normal     Adult ECG Report Not checked  Other studies Reviewed: Additional studies/ records that were reviewed today include:  Recent Labs:  No lipids since July 2016 Lab Results  Component Value Date   CREATININE 1.32* 04/13/2015   Lab Results  Component Value Date   K 4.2 04/13/2015    ASSESSMENT / PLAN:   Problem List Items Addressed This Visit    TIA (transient ischemic attack)    No recurrent symptoms. Now on Plavix plus aspirin. Also on high-dose statin. I think we could probably reduce her to 40 mg of Lipitor depending on her labs look like.      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) (Chronic)    Followed by Dr. Trula Slade along with splenic and mesenteric artery disease      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; now atretic and significantly improved LAD lesion without intervention (Chronic)    It would be nice to see if there is any worsening anterior perfusion defect to suggest recurrence or worsening of her LAD lesion. We'll do at least one more surveillance Myoview.       Hyperlipidemia with target LDL less than 70 (Chronic)    Doing well on high-dose statin.  Screening labs have looked good. We'll check Cardio IQ in June-July timeframe. This we will know if we are at goal and not. We may also but consider reducing statin dose.      Relevant Orders   Cardio IQ (R) Advanced Lipid Panel   Myocardial Perfusion Imaging   Hepatic function panel   Essential hypertension (Chronic)    Excellent control now on beta blocker and ARB      Dyspnea   Relevant Orders   Cardio IQ (R) Advanced Lipid Panel   Myocardial Perfusion Imaging   Hepatic function panel   Atherosclerosis of coronary artery without angina pectoris - Primary (Chronic)    Interestingly, she had single-vessel CABG with LIMA-LAD for what appeared to be severe ostial LAD disease. By 2005 the lesion was down to 30%, and her LIMA was atretic. Her last Myoview in 2012 showed a fixed anteroseptal, apical and inferoapical defect is moderate size consistent with scar. Mild ischemia noted. read as low risk. She has not had an evaluation since 2012, and does have pretty significant vascular risk profile. She has not been overly active because of her recent lung nodule scare. We talked about follow-up screening Myoview at least once after 2012 check, I would like to get one before I see her back in a year. That one looks the state same or stable, we would probably then plan on only doing them for her symptoms and not for surveillance.  Currently she doesn't seem to be overly  symptomatic. I did not want to do a stress test prior to her procedures last year, but things reasonable now. She is on stable dose of Toprol, losartan and Plavix. Also on high-dose Lipitor.      Relevant Orders   Cardio IQ (R) Advanced Lipid Panel   Myocardial Perfusion Imaging   Hepatic function panel    Other Visit Diagnoses    Drug therapy        Relevant Orders    Cardio IQ (R) Advanced Lipid Panel    Myocardial Perfusion Imaging      Hepatic function panel       Current medicines are reviewed at length with the patient today. (+/- concerns) what to do with aspirin/Plavix for her surgery The following changes have been made: See below   LABS IN June/July 2017 cardio IQ , liver function.- will mail lab slip in June 2017.  Your physician wants you to follow-up in [place].  Studies Ordered:   Orders Placed This Encounter  Procedures  . Cardio IQ (R) Advanced Lipid Panel  . Hepatic function panel  . Myocardial Perfusion Imaging   SCHEDULE IN JAN/FEB 2018    Leonie Man, M.D., M.S. Interventional Cardiologist   Pager # [phone]

## 2015-06-10 NOTE — Patient Instructions (Signed)
SCHEDULE IN JAN/FEB 2018  Your physician has requested that you have en exercise stress myoview. For further information please visit [email or web address]. Please follow instruction sheet, as given.   LABS IN June/July 2017 cardio IQ , liver function.- will mail lab slip in June 2017.  Your physician wants you to follow-up in 12 months with Dr Ellyn Hack.  You will receive a reminder letter in the mail two months in advance. If you don't receive a letter, please call our office to schedule the follow-up appointment.  If you need a refill on your cardiac medications before your next appointment, please call your pharmacy.

## 2015-06-13 ENCOUNTER — Encounter: Payer: Self-pay | Admitting: Cardiology

## 2015-06-13 NOTE — Assessment & Plan Note (Signed)
Excellent control now on beta blocker and ARB

## 2015-06-13 NOTE — Assessment & Plan Note (Signed)
Doing well on high-dose statin.  Screening labs have looked good. We'll check Cardio IQ in June-July timeframe. This we will know if we are at goal and not. We may also but consider reducing statin dose.

## 2015-06-13 NOTE — Assessment & Plan Note (Signed)
It would be nice to see if there is any worsening anterior perfusion defect to suggest recurrence or worsening of her LAD lesion. We'll do at least one more surveillance Myoview.

## 2015-06-13 NOTE — Assessment & Plan Note (Signed)
No recurrent symptoms. Now on Plavix plus aspirin. Also on high-dose statin. I think we could probably reduce her to 40 mg of Lipitor depending on her labs look like.

## 2015-06-13 NOTE — Assessment & Plan Note (Signed)
Interestingly, she had single-vessel CABG with LIMA-LAD for what appeared to be severe ostial LAD disease. By 2005 the lesion was down to 30%, and her LIMA was atretic. Her last Myoview in 2012 showed a fixed anteroseptal, apical and inferoapical defect is moderate size consistent with scar. Mild ischemia noted. read as low risk. She has not had an evaluation since 2012, and does have pretty significant vascular risk profile. She has not been overly active because of her recent lung nodule scare. We talked about follow-up screening Myoview at least once after 2012 check, I would like to get one before I see her back in a year. That one looks the state same or stable, we would probably then plan on only doing them for her symptoms and not for surveillance.  Currently she doesn't seem to be overly symptomatic. I did not want to do a stress test prior to her procedures last year, but things reasonable now. She is on stable dose of Toprol, losartan and Plavix. Also on high-dose Lipitor.

## 2015-06-13 NOTE — Assessment & Plan Note (Addendum)
Followed by Dr. 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

## 2015-06-16 ENCOUNTER — Ambulatory Visit
Admission: RE | Admit: 2015-06-16 | Discharge: 2015-06-16 | Disposition: A | Discharge status: Home / Self Care | Payer: Medicare Other | Source: Ambulatory Visit

## 2015-06-16 DIAGNOSIS — Z1231 Encounter for screening mammogram for malignant neoplasm of breast: Secondary | ICD-10-CM

## 2015-06-26 ENCOUNTER — Other Ambulatory Visit: Payer: Self-pay | Admitting: Internal Medicine

## 2015-07-21 ENCOUNTER — Other Ambulatory Visit: Payer: Self-pay | Admitting: Cardiothoracic Surgery

## 2015-07-21 DIAGNOSIS — R918 Other nonspecific abnormal finding of lung field: Secondary | ICD-10-CM

## 2015-07-22 ENCOUNTER — Ambulatory Visit (INDEPENDENT_AMBULATORY_CARE_PROVIDER_SITE_OTHER): Payer: Medicare Other | Admitting: Cardiothoracic Surgery

## 2015-07-22 ENCOUNTER — Encounter: Payer: Self-pay | Admitting: Cardiothoracic Surgery

## 2015-07-22 ENCOUNTER — Ambulatory Visit
Admission: RE | Admit: 2015-07-22 | Discharge: 2015-07-22 | Disposition: A | Discharge status: Home / Self Care | Payer: Medicare Other | Source: Ambulatory Visit | Attending: Cardiothoracic Surgery | Admitting: Cardiothoracic Surgery

## 2015-07-22 VITALS — BP 135/84 | HR 80 | Resp 20 | Ht 63.5 in | Wt 156.0 lb

## 2015-07-22 DIAGNOSIS — J984 Other disorders of lung: Secondary | ICD-10-CM | POA: Diagnosis not present

## 2015-07-22 DIAGNOSIS — R911 Solitary pulmonary nodule: Secondary | ICD-10-CM | POA: Diagnosis not present

## 2015-07-22 DIAGNOSIS — R918 Other nonspecific abnormal finding of lung field: Secondary | ICD-10-CM

## 2015-07-22 DIAGNOSIS — J8410 Pulmonary fibrosis, unspecified: Secondary | ICD-10-CM

## 2015-07-22 NOTE — Progress Notes (Signed)
[address]       [place], [address]             [phone]      Jerah E Werk Cuming Medical Record [id number] Date of Birth: [birth year]  Referring: Tanda Rockers, MD Primary Care: [name], MD  Chief Complaint:   POST OP FOLLOW UP 03/02/2015 OPERATIVE REPORT PREOPERATIVE DIAGNOSIS: Left upper lobe lung mass POSTOPERATIVE DIAGNOSIS: Left upper lobe lung mass. 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 PROCEDURE PERFORMED: Video bronchoscopy, left video-assisted thoracoscopy, minithoracotomy, wedge resection of left upper lobe. SURGEON: Lanelle Bal, M.D . History of Present Illness:     Patient has been well postoperatively as far as her surgical resection. Patient's had no further respiratory problems. She did smoke for 5-7 years but quit at age 55. With her limited smoking history she would not be eligible for lung cancer screening CT program.   Past Medical History  Diagnosis Date  . Hypothyroidism (acquired)     hypo  . Cervical disc disease   . Polymyalgia rheumatica (HCC)     2011 Dr. Charlestine Night  . Anemia   . Hyperlipidemia   . S/P CABG x 1 1996    CABG--LIMA-LAD for ostial LAD (not felt to be PCI amenable). EF NORMAL then; LIMA now atretic  . 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 Rheumatoid arthritis ([place]) 2011    Dr.Truslow; fracture knees, hands and wrists -   . CAD in native artery 1996, 2002, 2005     Status post CABG x1 with LIMA-LAD for ostial LAD 90% stenosis --> down to 50% in 2002 and 30% in 2005.;  Atretic LIMA; Myoview 06/2010: Fixed anteroseptal, apical and inferoapical defect with moderate size. Most likely scar. Mild subendocardial ischemia. EF 71% LOW RISK.   [name] COPD (chronic obstructive pulmonary disease) ([place])   . Hypertension   . Stroke [facility]) 11-2014    TIA   . Abdominal aortic aneurysm (HCC)     REPAIRED IN 1993 BY [name]  AND  HAS RECENTLY BEEN FOLLOWED BY [name]  . Shortness of breath dyspnea     with exertion  . Chronic kidney disease   . Urinary frequency   . Myocardial infarction [facility]) Aug. 2016    TIA     History  Smoking status  . Former Smoker  . Quit date: 12/04/1958  Smokeless tobacco  . Never Used    History  Alcohol Use  . 0.6 - 1.8 oz/week  . 1-3 Standard drinks or equivalent per week     Allergies  Allergen Reactions  . Aspirin Other (See Comments)    Hurts stomach  . Codeine Nausea And Vomiting  . Nitroglycerin Other (See Comments)    Heart rate drops    Current Outpatient Prescriptions  Medication Sig Dispense Refill  . acetaminophen (TYLENOL) 500 MG tablet Take 500 mg by mouth every 6 (six) hours as needed for mild pain.    [name] amoxicillin-clavulanate (AUGMENTIN) 875-125 MG tablet Take 1 tablet by mouth as needed (ongoing therapy). Reported on 06/10/2015    . atorvastatin (LIPITOR) 80 MG tablet Take 1 tablet (80 mg total) by mouth daily. 90 tablet 3  . cetirizine (ZYRTEC) 10 MG tablet Take 10 mg by mouth daily.    . clopidogrel (PLAVIX) 75 MG tablet Take 1 tablet by mouth  daily 90 tablet 3  .  leflunomide (ARAVA) 20 MG tablet Take 1 tablet (20 mg total) by mouth every other day. 15 tablet 6  . levothyroxine (SYNTHROID, LEVOTHROID) 88 MCG tablet Take 1 tablet by mouth  daily 90 tablet 3  . loperamide (IMODIUM A-D) 2 MG tablet Take 2 mg by mouth 4 (four) times daily as needed for diarrhea or loose stools.    [name] losartan (COZAAR) 25 MG tablet Take 1 tablet by mouth  daily 90 tablet 3  . metoprolol succinate (TOPROL-XL) 25 MG 24 hr tablet Take 1 tablet by mouth  daily 90 tablet 3  . Probiotic Product (ALIGN) 4 MG CAPS Take 1 capsule by mouth daily. 30 capsule 1  . psyllium (METAMUCIL) 58.6 % packet Take 1 packet by mouth daily.    . traMADol (ULTRAM) 50 MG tablet Take 1-2 tablets (50-100 mg total) by mouth every 6 (six) hours as needed (mild pain). 30 tablet 0   No current  facility-administered medications for this visit.       Physical Exam: BP 135/84 mmHg  Pulse 80  Resp 20  Ht 5' 3.5" (1.613 m)  Wt 156 lb (70.761 kg)  BMI 27.20 kg/m2  SpO2 96%  General appearance: alert, cooperative, appears stated age and no distress Neurologic: intact Heart: regular rate and rhythm, S1, S2 normal, no murmur, click, rub or gallop Lungs: clear to auscultation bilaterally Abdomen: soft, non-tender; bowel sounds normal; no masses,  no organomegaly Extremities: extremities normal, atraumatic, no cyanosis or edema and Homans sign is negative, no sign of DVT Wound: Her left chest incision and chest tube sites are all well-healed   Diagnostic Studies & Laboratory data:     Recent Radiology Findings:   Dg Chest 2 View  07/22/2015  CLINICAL DATA:  Lung mass EXAM: CHEST  2 VIEW COMPARISON:  04/13/2015 FINDINGS: Cardiac shadow is within normal limits. Postsurgical changes are noted. The lungs are well aerated bilaterally. No focal mass lesion or infiltrate is noted. No acute bony abnormality noted. IMPRESSION: No acute abnormality noted. Electronically Signed   By: Inez Catalina M.D.   On: 07/22/2015 10:00     Recent Lab Findings: Lab Results  Component Value Date   WBC 9.5 04/13/2015   HGB 11.7* 04/13/2015   HCT 36.9 04/13/2015   PLT 398 04/13/2015   GLUCOSE 118* 04/13/2015   CHOL 168 11/26/2014   TRIG 111.0 11/26/2014   HDL 75.10 11/26/2014   LDLDIRECT 162.1 10/28/2009   LDLCALC 71 11/26/2014   ALT 74* 03/04/2015   AST 81* 03/04/2015   NA 142 04/13/2015   K 4.2 04/13/2015   CL 109 04/13/2015   CREATININE 1.32* 04/13/2015   BUN 28* 04/13/2015   CO2 25 04/13/2015   TSH 1.459 04/27/2015   INR 1.00 02/26/2015      Assessment / Plan:    Stable following recent resection of left upper lobe lung nodule, then 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 So far on cultures and smears no specific organisms have been  identified.  Plan to see her back with a follow-up chest x-ray in 4 months  with a follow-up CT scan.     Grace Isaac MD      [address] [place] [address] Office [phone]   Beeper [phone]  07/22/2015 10:45 AM

## 2015-08-05 ENCOUNTER — Ambulatory Visit: Payer: Medicare Other | Admitting: Internal Medicine

## 2015-08-20 ENCOUNTER — Ambulatory Visit (INDEPENDENT_AMBULATORY_CARE_PROVIDER_SITE_OTHER): Payer: Medicare Other | Admitting: Internal Medicine

## 2015-08-20 ENCOUNTER — Encounter: Payer: Self-pay | Admitting: Internal Medicine

## 2015-08-20 VITALS — BP 110/60 | HR 92 | Wt 157.0 lb

## 2015-08-20 DIAGNOSIS — I2510 Atherosclerotic heart disease of native coronary artery without angina pectoris: Secondary | ICD-10-CM

## 2015-08-20 DIAGNOSIS — I10 Essential (primary) hypertension: Secondary | ICD-10-CM | POA: Diagnosis not present

## 2015-08-20 DIAGNOSIS — Z0000 Encounter for general adult medical examination without abnormal findings: Secondary | ICD-10-CM

## 2015-08-20 DIAGNOSIS — M069 Rheumatoid arthritis, unspecified: Secondary | ICD-10-CM

## 2015-08-20 DIAGNOSIS — G459 Transient cerebral ischemic attack, unspecified: Secondary | ICD-10-CM | POA: Diagnosis not present

## 2015-08-20 MED ORDER — ATORVASTATIN CALCIUM 80 MG PO TABS: 80.0000 mg | ORAL_TABLET | Freq: Every day | ORAL | Status: DC

## 2015-08-20 NOTE — Progress Notes (Signed)
Subjective:  Patient ID: [name], female    DOB: [birth year]  Age: 75 y.o. MRN: [id number]  CC: No chief complaint on file.   HPI [name] presents for CAD, HTN, RA f/u  Outpatient Prescriptions Prior to Visit  Medication Sig Dispense Refill  . acetaminophen (TYLENOL) 500 MG tablet Take 500 mg by mouth every 6 (six) hours as needed for mild pain.    [name] amoxicillin-clavulanate (AUGMENTIN) 875-125 MG tablet Take 1 tablet by mouth as needed (ongoing therapy). Reported on 06/10/2015    . atorvastatin (LIPITOR) 80 MG tablet Take 1 tablet (80 mg total) by mouth daily. 90 tablet 3  . cetirizine (ZYRTEC) 10 MG tablet Take 10 mg by mouth daily.    . clopidogrel (PLAVIX) 75 MG tablet Take 1 tablet by mouth  daily 90 tablet 3  . leflunomide (ARAVA) 20 MG tablet Take 1 tablet (20 mg total) by mouth every other day. 15 tablet 6  . levothyroxine (SYNTHROID, LEVOTHROID) 88 MCG tablet Take 1 tablet by mouth  daily 90 tablet 3  . loperamide (IMODIUM A-D) 2 MG tablet Take 2 mg by mouth 4 (four) times daily as needed for diarrhea or loose stools.    [name] losartan (COZAAR) 25 MG tablet Take 1 tablet by mouth  daily 90 tablet 3  . metoprolol succinate (TOPROL-XL) 25 MG 24 hr tablet Take 1 tablet by mouth  daily 90 tablet 3  . Probiotic Product (ALIGN) 4 MG CAPS Take 1 capsule by mouth daily. 30 capsule 1  . psyllium (METAMUCIL) 58.6 % packet Take 1 packet by mouth daily.    . traMADol (ULTRAM) 50 MG tablet Take 1-2 tablets (50-100 mg total) by mouth every 6 (six) hours as needed (mild pain). 30 tablet 0   No facility-administered medications prior to visit.    ROS Review of Systems  Constitutional: Negative for chills, activity change, appetite change, fatigue and unexpected weight change.  HENT: Negative for congestion, mouth sores and sinus pressure.   Eyes: Negative for visual disturbance.  Respiratory: Negative for cough and chest tightness.   Gastrointestinal: Negative for nausea and  abdominal pain.  Genitourinary: Negative for frequency, difficulty urinating and vaginal pain.  Musculoskeletal: Positive for arthralgias. Negative for back pain and gait problem.  Skin: Negative for pallor and rash.  Neurological: Negative for dizziness, tremors, weakness, numbness and headaches.  Psychiatric/Behavioral: Negative for confusion and sleep disturbance.    Objective:  BP 110/60 mmHg  Pulse 92  Wt 157 lb (71.215 kg)  SpO2 94%  BP Readings from Last 3 Encounters:  08/20/15 110/60  07/22/15 135/84  06/10/15 118/70    Wt Readings from Last 3 Encounters:  08/20/15 157 lb (71.215 kg)  07/22/15 156 lb (70.761 kg)  06/10/15 157 lb (71.215 kg)    Physical Exam  Constitutional: She appears well-developed. No distress.  HENT:  Head: Normocephalic.  Right Ear: External ear normal.  Left Ear: External ear normal.  Nose: Nose normal.  Mouth/Throat: Oropharynx is clear and moist.  Eyes: Conjunctivae are normal. Pupils are equal, round, and reactive to light. Right eye exhibits no discharge. Left eye exhibits no discharge.  Neck: Normal range of motion. Neck supple. No JVD present. No tracheal deviation present. No thyromegaly present.  Cardiovascular: Normal rate, regular rhythm and normal heart sounds.   Pulmonary/Chest: No stridor. No respiratory distress. She has no wheezes.  Abdominal: Soft. Bowel sounds are normal. She exhibits no distension and no mass. There is no tenderness.  There is no rebound and no guarding.  Musculoskeletal: She exhibits no edema or tenderness.  Lymphadenopathy:    She has no cervical adenopathy.  Neurological: She displays normal reflexes. No cranial nerve deficit. She exhibits normal muscle tone. Coordination normal.  Skin: No rash noted. No erythema.  Psychiatric: She has a normal mood and affect. Her behavior is normal. Judgment and thought content normal.  obese  Lab Results  Component Value Date   WBC 9.5 04/13/2015   HGB 11.7*  04/13/2015   HCT 36.9 04/13/2015   PLT 398 04/13/2015   GLUCOSE 118* 04/13/2015   CHOL 168 11/26/2014   TRIG 111.0 11/26/2014   HDL 75.10 11/26/2014   LDLDIRECT 162.1 10/28/2009   LDLCALC 71 11/26/2014   ALT 74* 03/04/2015   AST 81* 03/04/2015   NA 142 04/13/2015   K 4.2 04/13/2015   CL 109 04/13/2015   CREATININE 1.32* 04/13/2015   BUN 28* 04/13/2015   CO2 25 04/13/2015   TSH 1.459 04/27/2015   INR 1.00 02/26/2015    Dg Chest 2 View  07/22/2015  CLINICAL DATA:  Lung mass EXAM: CHEST  2 VIEW COMPARISON:  04/13/2015 FINDINGS: Cardiac shadow is within normal limits. Postsurgical changes are noted. The lungs are well aerated bilaterally. No focal mass lesion or infiltrate is noted. No acute bony abnormality noted. IMPRESSION: No acute abnormality noted. Electronically Signed   By: Inez Catalina M.D.   On: 07/22/2015 10:00    Assessment & Plan:   There are no diagnoses linked to this encounter. I am having Ms. Pastorek maintain her psyllium, leflunomide, ALIGN, atorvastatin, cetirizine, amoxicillin-clavulanate, loperamide, traMADol, acetaminophen, levothyroxine, losartan, clopidogrel, and metoprolol succinate.  No orders of the defined types were placed in this encounter.     Follow-up: No Follow-up on file.  [name], MD

## 2015-08-20 NOTE — Progress Notes (Signed)
Pre visit review using our clinic review tool, if applicable. No additional management support is needed unless otherwise documented below in the visit note. 

## 2015-08-20 NOTE — Assessment & Plan Note (Signed)
Summer 2016  Losartan, Metoprolol, Lipitor, Plavix No relapse

## 2015-08-20 NOTE — Assessment & Plan Note (Signed)
Tramadol rare Tylenol prn

## 2015-08-20 NOTE — Assessment & Plan Note (Signed)
Losartan, Metoprolol, Lipitor, Plavix 

## 2015-08-20 NOTE — Assessment & Plan Note (Signed)
   Losartan, Metoprolol

## 2015-09-30 ENCOUNTER — Encounter: Payer: Self-pay | Admitting: Internal Medicine

## 2015-10-12 ENCOUNTER — Telehealth: Payer: Self-pay | Admitting: *Deleted

## 2015-10-12 DIAGNOSIS — I2510 Atherosclerotic heart disease of native coronary artery without angina pectoris: Secondary | ICD-10-CM

## 2015-10-12 DIAGNOSIS — E785 Hyperlipidemia, unspecified: Secondary | ICD-10-CM

## 2015-10-12 DIAGNOSIS — R0600 Dyspnea, unspecified: Secondary | ICD-10-CM

## 2015-10-12 DIAGNOSIS — Z79899 Other long term (current) drug therapy: Secondary | ICD-10-CM

## 2015-10-12 NOTE — Telephone Encounter (Signed)
MAILED LAB SLIP-- TO PATIENT cardio IQ, hepatic panel

## 2015-10-12 NOTE — Telephone Encounter (Signed)
-----   Message from Raiford Simmonds, RN sent at 06/10/2015  9:03 AM EST ----- Mail in june 2017 cardio iq ,liver ftn

## 2015-10-20 LAB — HEPATIC FUNCTION PANEL
ALBUMIN: 4.2 g/dL (ref 3.6–5.1)
ALK PHOS: 165 U/L — AB (ref 33–130)
ALT: 37 U/L — ABNORMAL HIGH (ref 6–29)
AST: 33 U/L (ref 10–35)
Bilirubin, Direct: 0.1 mg/dL (ref <=0.2)
Indirect Bilirubin: 0.5 mg/dL (ref 0.2–1.2)
TOTAL PROTEIN: 6.6 g/dL (ref 6.1–8.1)
Total Bilirubin: 0.6 mg/dL (ref 0.2–1.2)

## 2015-10-23 LAB — CARDIO IQ(R) ADVANCED LIPID PANEL
APOLIPOPROTEIN (CARDIO IQ ADV LIPID PANEL): 65 mg/dL (ref 49–103)
CHOLESTEROL, TOTAL (CARDIO IQ ADV LIPID PANEL): 170 mg/dL (ref 125–200)
CHOLESTEROL/HDL RATIO (CARDIO IQ ADV LIPID PANEL): 2.1 calc (ref <=5.0)
HDL CHOLESTEROL (CARDIO IQ ADV LIPID PANEL): 80 mg/dL (ref >=46)
LDL CHOLESTEROL CALCULATED (CARDIO IQ ADV LIPID PANEL): 73 mg/dL
LDL Large: 6762 nmol/L (ref 5038–17886)
LDL MEDIUM: 163 nmol/L (ref 121–397)
LDL PARTICLE NUMBER: 843 nmol/L — AB (ref 1016–2185)
LDL Peak Size: 217.4 Angstrom — ABNORMAL LOW (ref >=218.2)
LDL Small: 143 nmol/L (ref 115–386)
Lipoprotein (a): 65 nmol/L (ref <75)
NON-HDL CHOLESTEROL (CARDIO IQ ADV LIPID PANEL): 90 mg/dL
TRIGLYCERIDES (CARDIO IQ ADV LIPID PANEL): 83 mg/dL

## 2015-10-27 ENCOUNTER — Other Ambulatory Visit: Payer: Self-pay | Admitting: Cardiothoracic Surgery

## 2015-10-27 DIAGNOSIS — R911 Solitary pulmonary nodule: Secondary | ICD-10-CM

## 2015-11-04 ENCOUNTER — Other Ambulatory Visit: Payer: Self-pay | Admitting: *Deleted

## 2015-11-04 DIAGNOSIS — Z79899 Other long term (current) drug therapy: Secondary | ICD-10-CM

## 2015-11-04 DIAGNOSIS — E785 Hyperlipidemia, unspecified: Secondary | ICD-10-CM

## 2015-11-07 ENCOUNTER — Encounter: Payer: Self-pay | Admitting: Internal Medicine

## 2015-11-22 ENCOUNTER — Encounter: Payer: Self-pay | Admitting: Gastroenterology

## 2015-11-23 ENCOUNTER — Encounter: Payer: Self-pay | Admitting: Cardiothoracic Surgery

## 2015-11-23 ENCOUNTER — Ambulatory Visit
Admission: RE | Admit: 2015-11-23 | Discharge: 2015-11-23 | Disposition: A | Discharge status: Home / Self Care | Payer: Medicare Other | Source: Ambulatory Visit | Attending: Cardiothoracic Surgery | Admitting: Cardiothoracic Surgery

## 2015-11-23 ENCOUNTER — Ambulatory Visit (INDEPENDENT_AMBULATORY_CARE_PROVIDER_SITE_OTHER): Payer: Medicare Other | Admitting: Cardiothoracic Surgery

## 2015-11-23 VITALS — BP 110/64 | HR 76 | Resp 20

## 2015-11-23 DIAGNOSIS — J984 Other disorders of lung: Secondary | ICD-10-CM

## 2015-11-23 DIAGNOSIS — J8410 Pulmonary fibrosis, unspecified: Secondary | ICD-10-CM

## 2015-11-23 DIAGNOSIS — R911 Solitary pulmonary nodule: Secondary | ICD-10-CM

## 2015-11-23 NOTE — Progress Notes (Signed)
[address]       [place] [address]             [phone]      [name]  Medical Record [id number] Date of Birth: [birth year]  Referring: Tanda Rockers, MD Primary Care: [name], MD  Chief Complaint:   POST OP FOLLOW UP 03/02/2015 OPERATIVE REPORT PREOPERATIVE DIAGNOSIS: Left upper lobe lung mass POSTOPERATIVE DIAGNOSIS: Left upper lobe lung mass. 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 PROCEDURE PERFORMED: Video bronchoscopy, left video-assisted thoracoscopy, minithoracotomy, wedge resection of left upper lobe. SURGEON: Lanelle Bal, M.D . History of Present Illness:     Patient has been well postoperatively as far as her surgical resection. Patient's had no further respiratory problems. She did smoke for 5-7 years but quit at age 34. With her limited smoking history she would not be eligible for lung cancer screening CT program.   Past Medical History:  Diagnosis Date  . Abdominal aortic aneurysm (HCC)    REPAIRED IN 1993 BY [name]  AND HAS RECENTLY BEEN FOLLOWED BY [name]  . 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 Anemia   . CAD in native artery 1996, 2002, 2005    Status post CABG x1 with LIMA-LAD for ostial LAD 90% stenosis --> down to 50% in 2002 and 30% in 2005.;  Atretic LIMA; Myoview 06/2010: Fixed anteroseptal, apical and inferoapical defect with moderate size. Most likely scar. Mild subendocardial ischemia. EF 71% LOW RISK.   [name] Cervical disc disease   . Chronic kidney disease   . COPD (chronic obstructive pulmonary disease) ([place])   . Hyperlipidemia   . Hypertension   . Hypothyroidism (acquired)    hypo  . Myocardial infarction [facility]) Aug. 2016   TIA  . Polymyalgia rheumatica (HCC)    2011 Dr. Charlestine Night  . Rheumatoid arthritis ([place]) 2011   Dr.Truslow; fracture knees, hands and wrists -   . S/P CABG x 1 1996   CABG--LIMA-LAD  for ostial LAD (not felt to be PCI amenable). EF NORMAL then; LIMA now atretic  . Shortness of breath dyspnea    with exertion  . Stroke [facility]) 11-2014   TIA   . Urinary frequency      History  Smoking Status  . Former Smoker  . Quit date: 12/04/1958  Smokeless Tobacco  . Never Used    History  Alcohol Use  . 0.6 - 1.8 oz/week  . 1 - 3 Standard drinks or equivalent per week     Allergies  Allergen Reactions  . Aspirin Other (See Comments)    Hurts stomach  . Codeine Nausea And Vomiting  . Nitroglycerin Other (See Comments)    Heart rate drops    Current Outpatient Prescriptions  Medication Sig Dispense Refill  . acetaminophen (TYLENOL) 500 MG tablet Take 500 mg by mouth every 6 (six) hours as needed for mild pain.    [name] amoxicillin-clavulanate (AUGMENTIN) 875-125 MG tablet Take 1 tablet by mouth as needed (ongoing therapy). Reported on 06/10/2015    . atorvastatin (LIPITOR) 80 MG tablet Take 1 tablet (80 mg total) by mouth daily. 90 tablet 3  . clopidogrel (PLAVIX) 75 MG tablet Take 1 tablet by mouth  daily 90 tablet 3  . leflunomide (ARAVA) 20 MG tablet Take 1 tablet (20 mg total) by mouth every other day. 15 tablet 6  . levothyroxine (  SYNTHROID, LEVOTHROID) 88 MCG tablet Take 1 tablet by mouth  daily 90 tablet 3  . loperamide (IMODIUM A-D) 2 MG tablet Take 2 mg by mouth 4 (four) times daily as needed for diarrhea or loose stools.    [name] losartan (COZAAR) 25 MG tablet Take 1 tablet by mouth  daily 90 tablet 3  . metoprolol succinate (TOPROL-XL) 25 MG 24 hr tablet Take 1 tablet by mouth  daily 90 tablet 3  . Probiotic Product (ALIGN) 4 MG CAPS Take 1 capsule by mouth daily. 30 capsule 1  . psyllium (METAMUCIL) 58.6 % packet Take 1 packet by mouth daily.    . traMADol (ULTRAM) 50 MG tablet Take 1-2 tablets (50-100 mg total) by mouth every 6 (six) hours as needed (mild pain). 30 tablet 0   No current facility-administered medications for this visit.        Physical  Exam: BP 110/64 (BP Location: Left Arm, Patient Position: Sitting, Cuff Size: Small)   Pulse 76   Resp 20   General appearance: alert, cooperative, appears stated age and no distress Neurologic: intact Heart: regular rate and rhythm, S1, S2 normal, no murmur, click, rub or gallop Lungs: clear to auscultation bilaterally Abdomen: soft, non-tender; bowel sounds normal; no masses,  no organomegaly Extremities: extremities normal, atraumatic, no cyanosis or edema and Homans sign is negative, no sign of DVT Wound: Her left chest incision and chest tube sites are all well-healed No cervical  Nodes noted   Diagnostic Studies & Laboratory data:     Recent Radiology Findings:   Ct Chest Wo Contrast  Result Date: 11/23/2015 CLINICAL DATA:  Followup left lung nodule. Previous wedge resection revealed necrotizing granuloma. EXAM: CT CHEST WITHOUT CONTRAST TECHNIQUE: Multidetector CT imaging of the chest was performed following the standard protocol without IV contrast. COMPARISON:  01/29/2015 FINDINGS: Cardiovascular: Heart size within normal limits. Aortic atherosclerosis noted. Previous CABG. Mediastinum/Lymph Nodes: No masses or pathologically enlarged lymph nodes identified on this un-enhanced exam. Lungs/Pleura: Postop changes seen in the medial left upper lobe from previous wedge resection. 8 mm mean diameter perifissural nodule nodule is seen in the left midlung on image 66/4. This is new since previous study, but likely represents an intrapulmonary lymph node. Mild centrilobular emphysema noted. Bibasilar scarring and tiny sub-cm posterior right lower lobe pulmonary nodule remains stable. No evidence of acute infiltrate or pleural effusion. Upper abdomen: No acute findings. Musculoskeletal: No chest wall mass or suspicious bone lesions identified. IMPRESSION: Postop changes in left upper lobe. 8 mm mean diameter perifissural nodule in the left midlung is new, but likely represents an intrapulmonary  lymph node. Non-contrast chest CT at 6-12 months is recommended. If the nodule is stable at time of repeat CT, then future CT at 18-24 months (from today's scan) is considered optional for low-risk patients, but is recommended for high-risk patients. This recommendation follows the consensus statement: Guidelines for Management of Incidental Pulmonary Nodules Detected on CT Images:From the Fleischner Society 2017; published online before print (10.1148/radiol.IJ:2314499). Aortic atherosclerosis and emphysema. Electronically Signed   By: Earle Gell M.D.   On: 11/23/2015 15:46    Recent Lab Findings: Lab Results  Component Value Date   WBC 9.5 04/13/2015   HGB 11.7 (L) 04/13/2015   HCT 36.9 04/13/2015   PLT 398 04/13/2015   GLUCOSE 118 (H) 04/13/2015   CHOL 170 10/19/2015   TRIG 83 10/19/2015   HDL 80 10/19/2015   LDLDIRECT 162.1 10/28/2009   LDLCALC 73 10/19/2015   ALT  37 (H) 10/19/2015   AST 33 10/19/2015   NA 142 04/13/2015   K 4.2 04/13/2015   CL 109 04/13/2015   CREATININE 1.32 (H) 04/13/2015   BUN 28 (H) 04/13/2015   CO2 25 04/13/2015   TSH 1.459 04/27/2015   INR 1.00 02/26/2015      Assessment / Plan:    Stable following recent resection of left upper lobe lung nodule, then 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 So far on cultures and smears no specific organisms have been identified.  Follow up ct Postop changes in left upper lobe. 8 mm mean diameter perifissural nodule in the left midlung is new, but likely represents an intrapulmonary lymph node.  Plan to see her back with a follow-up chest x-ray in 6 months  with a follow-up CT scan.     Grace Isaac MD      [address] [place] [address] Office [phone]   Beeper [phone]  11/23/2015 4:12 PM

## 2015-11-25 ENCOUNTER — Encounter: Payer: Self-pay | Admitting: Gastroenterology

## 2015-11-30 ENCOUNTER — Encounter: Payer: Self-pay | Admitting: Cardiothoracic Surgery

## 2015-12-14 ENCOUNTER — Other Ambulatory Visit (INDEPENDENT_AMBULATORY_CARE_PROVIDER_SITE_OTHER): Payer: Medicare Other

## 2015-12-14 ENCOUNTER — Ambulatory Visit (INDEPENDENT_AMBULATORY_CARE_PROVIDER_SITE_OTHER): Payer: Medicare Other | Admitting: Internal Medicine

## 2015-12-14 ENCOUNTER — Encounter: Payer: Self-pay | Admitting: Internal Medicine

## 2015-12-14 DIAGNOSIS — M25531 Pain in right wrist: Secondary | ICD-10-CM

## 2015-12-14 DIAGNOSIS — E034 Atrophy of thyroid (acquired): Secondary | ICD-10-CM | POA: Diagnosis not present

## 2015-12-14 DIAGNOSIS — M069 Rheumatoid arthritis, unspecified: Secondary | ICD-10-CM | POA: Diagnosis not present

## 2015-12-14 DIAGNOSIS — E038 Other specified hypothyroidism: Secondary | ICD-10-CM

## 2015-12-14 LAB — BASIC METABOLIC PANEL
BUN: 30 mg/dL — ABNORMAL HIGH (ref 6–23)
CO2: 24 mEq/L (ref 19–32)
Calcium: 10.1 mg/dL (ref 8.4–10.5)
Chloride: 104 mEq/L (ref 96–112)
Creatinine, Ser: 1.47 mg/dL — ABNORMAL HIGH (ref 0.40–1.20)
GFR: 36.59 mL/min — AB (ref >60.00)
GLUCOSE: 106 mg/dL — AB (ref 70–99)
POTASSIUM: 4.7 meq/L (ref 3.5–5.1)
SODIUM: 137 meq/L (ref 135–145)

## 2015-12-14 LAB — SEDIMENTATION RATE: SED RATE: 20 mm/h (ref 0–30)

## 2015-12-14 LAB — URIC ACID: Uric Acid, Serum: 6.5 mg/dL (ref 2.4–7.0)

## 2015-12-14 MED ORDER — PREDNISONE 10 MG PO TABS: ORAL_TABLET | ORAL | 1 refills | Status: DC

## 2015-12-14 NOTE — Progress Notes (Signed)
Pre visit review using our clinic review tool, if applicable. No additional management support is needed unless otherwise documented below in the visit note. 

## 2015-12-14 NOTE — Assessment & Plan Note (Signed)
8/17 RA vs gout vs pseudogout ACE Labs Prednisone 10 mg: take 4 tabs a day x 3 days; then 3 tabs a day x 4 days; then 2 tabs a day x 4 days, then 1 tab a day x 6 days, then stop. Take pc.  Potential benefits of  steroid  use as well as potential risks  and complications were explained to the patient and were aknowledged.

## 2015-12-14 NOTE — Assessment & Plan Note (Signed)
On Levothroid 

## 2015-12-14 NOTE — Progress Notes (Addendum)
Subjective:  Patient ID: [name], female    DOB: 04/07/1941  Age: 75 y.o. MRN: [id number]  CC: Arm Swelling (Right hand/wrist x 3 days)   HPI [name] presents for severe R wrist pain and swelling x 3-4 d duration  Outpatient Medications Prior to Visit  Medication Sig Dispense Refill  . acetaminophen (TYLENOL) 500 MG tablet Take 500 mg by mouth every 6 (six) hours as needed for mild pain.    [name] amoxicillin-clavulanate (AUGMENTIN) 875-125 MG tablet Take 1 tablet by mouth as needed (ongoing therapy). Reported on 06/10/2015    . atorvastatin (LIPITOR) 80 MG tablet Take 1 tablet (80 mg total) by mouth daily. 90 tablet 3  . clopidogrel (PLAVIX) 75 MG tablet Take 1 tablet by mouth  daily 90 tablet 3  . leflunomide (ARAVA) 20 MG tablet Take 1 tablet (20 mg total) by mouth every other day. 15 tablet 6  . levothyroxine (SYNTHROID, LEVOTHROID) 88 MCG tablet Take 1 tablet by mouth  daily 90 tablet 3  . loperamide (IMODIUM A-D) 2 MG tablet Take 2 mg by mouth 4 (four) times daily as needed for diarrhea or loose stools.    [name] losartan (COZAAR) 25 MG tablet Take 1 tablet by mouth  daily 90 tablet 3  . metoprolol succinate (TOPROL-XL) 25 MG 24 hr tablet Take 1 tablet by mouth  daily 90 tablet 3  . Probiotic Product (ALIGN) 4 MG CAPS Take 1 capsule by mouth daily. 30 capsule 1  . psyllium (METAMUCIL) 58.6 % packet Take 1 packet by mouth daily.    . traMADol (ULTRAM) 50 MG tablet Take 1-2 tablets (50-100 mg total) by mouth every 6 (six) hours as needed (mild pain). 30 tablet 0   No facility-administered medications prior to visit.     ROS Review of Systems  Constitutional: Negative for activity change, appetite change, chills, fatigue and unexpected weight change.  HENT: Negative for congestion, mouth sores and sinus pressure.   Eyes: Negative for visual disturbance.  Respiratory: Negative for cough and chest tightness.   Gastrointestinal: Negative for abdominal pain and nausea.    Genitourinary: Negative for difficulty urinating, frequency and vaginal pain.  Musculoskeletal: Positive for arthralgias and joint swelling. Negative for back pain and gait problem.  Skin: Negative for pallor and rash.  Neurological: Negative for dizziness, tremors, weakness, numbness and headaches.  Psychiatric/Behavioral: Negative for confusion and sleep disturbance.    Objective:  BP 130/88   Pulse 84   Temp 97.6 F (36.4 C) (Oral)   Wt 157 lb (71.2 kg)   SpO2 97%   BMI 27.38 kg/m   BP Readings from Last 3 Encounters:  12/14/15 130/88  11/23/15 110/64  08/20/15 110/60    Wt Readings from Last 3 Encounters:  12/14/15 157 lb (71.2 kg)  08/20/15 157 lb (71.2 kg)  07/22/15 156 lb (70.8 kg)    Physical Exam  Constitutional: She appears well-developed. No distress.  HENT:  Head: Normocephalic.  Right Ear: External ear normal.  Left Ear: External ear normal.  Nose: Nose normal.  Mouth/Throat: Oropharynx is clear and moist.  Eyes: Conjunctivae are normal. Pupils are equal, round, and reactive to light. Right eye exhibits no discharge. Left eye exhibits no discharge.  Neck: Normal range of motion. Neck supple. No JVD present. No tracheal deviation present. No thyromegaly present.  Cardiovascular: Normal rate, regular rhythm and normal heart sounds.   Pulmonary/Chest: No stridor. No respiratory distress. She has no wheezes.  Abdominal: Soft. Bowel  sounds are normal. She exhibits no distension and no mass. There is no tenderness. There is no rebound and no guarding.  Musculoskeletal: She exhibits edema and tenderness.  Lymphadenopathy:    She has no cervical adenopathy.  Neurological: She displays normal reflexes. No cranial nerve deficit. She exhibits normal muscle tone. Coordination normal.  Skin: No rash noted. There is erythema.  Psychiatric: She has a normal mood and affect. Her behavior is normal. Judgment and thought content normal.   R wrist painful warm swelling  w/erythema  Lab Results  Component Value Date   WBC 9.5 04/13/2015   HGB 11.7 (L) 04/13/2015   HCT 36.9 04/13/2015   PLT 398 04/13/2015   GLUCOSE 118 (H) 04/13/2015   CHOL 170 10/19/2015   TRIG 83 10/19/2015   HDL 80 10/19/2015   LDLDIRECT 162.1 10/28/2009   LDLCALC 73 10/19/2015   ALT 37 (H) 10/19/2015   AST 33 10/19/2015   NA 142 04/13/2015   K 4.2 04/13/2015   CL 109 04/13/2015   CREATININE 1.32 (H) 04/13/2015   BUN 28 (H) 04/13/2015   CO2 25 04/13/2015   TSH 1.459 04/27/2015   INR 1.00 02/26/2015    Ct Chest Wo Contrast  Result Date: 11/23/2015 CLINICAL DATA:  Followup left lung nodule. Previous wedge resection revealed necrotizing granuloma. EXAM: CT CHEST WITHOUT CONTRAST TECHNIQUE: Multidetector CT imaging of the chest was performed following the standard protocol without IV contrast. COMPARISON:  01/29/2015 FINDINGS: Cardiovascular: Heart size within normal limits. Aortic atherosclerosis noted. Previous CABG. Mediastinum/Lymph Nodes: No masses or pathologically enlarged lymph nodes identified on this un-enhanced exam. Lungs/Pleura: Postop changes seen in the medial left upper lobe from previous wedge resection. 8 mm mean diameter perifissural nodule nodule is seen in the left midlung on image 66/4. This is new since previous study, but likely represents an intrapulmonary lymph node. Mild centrilobular emphysema noted. Bibasilar scarring and tiny sub-cm posterior right lower lobe pulmonary nodule remains stable. No evidence of acute infiltrate or pleural effusion. Upper abdomen: No acute findings. Musculoskeletal: No chest wall mass or suspicious bone lesions identified. IMPRESSION: Postop changes in left upper lobe. 8 mm mean diameter perifissural nodule in the left midlung is new, but likely represents an intrapulmonary lymph node. Non-contrast chest CT at 6-12 months is recommended. If the nodule is stable at time of repeat CT, then future CT at 18-24 months (from today's scan)  is considered optional for low-risk patients, but is recommended for high-risk patients. This recommendation follows the consensus statement: Guidelines for Management of Incidental Pulmonary Nodules Detected on CT Images:From the Fleischner Society 2017; published online before print (10.1148/radiol.IJ:2314499). Aortic atherosclerosis and emphysema. Electronically Signed   By: Earle Gell M.D.   On: 11/23/2015 15:46   Assessment & Plan:   There are no diagnoses linked to this encounter. I am having Ms. Fritchman maintain her psyllium, leflunomide, ALIGN, amoxicillin-clavulanate, loperamide, traMADol, acetaminophen, levothyroxine, losartan, clopidogrel, metoprolol succinate, atorvastatin, and BIOTIN PO.  Meds ordered this encounter  Medications  . BIOTIN PO    Sig: Take 1 each by mouth daily.     Follow-up: No Follow-up on file.  [name], MD

## 2015-12-14 NOTE — Assessment & Plan Note (Signed)
On arava

## 2015-12-20 ENCOUNTER — Encounter: Payer: Self-pay | Admitting: Internal Medicine

## 2015-12-23 ENCOUNTER — Ambulatory Visit (INDEPENDENT_AMBULATORY_CARE_PROVIDER_SITE_OTHER): Payer: Medicare Other | Admitting: Internal Medicine

## 2015-12-23 ENCOUNTER — Encounter: Payer: Self-pay | Admitting: Internal Medicine

## 2015-12-23 ENCOUNTER — Other Ambulatory Visit (INDEPENDENT_AMBULATORY_CARE_PROVIDER_SITE_OTHER): Payer: Medicare Other

## 2015-12-23 VITALS — BP 126/78 | HR 73 | Temp 98.1°F | Ht 63.0 in | Wt 157.1 lb

## 2015-12-23 DIAGNOSIS — I10 Essential (primary) hypertension: Secondary | ICD-10-CM

## 2015-12-23 DIAGNOSIS — D510 Vitamin B12 deficiency anemia due to intrinsic factor deficiency: Secondary | ICD-10-CM

## 2015-12-23 DIAGNOSIS — Z23 Encounter for immunization: Secondary | ICD-10-CM | POA: Diagnosis not present

## 2015-12-23 DIAGNOSIS — I2510 Atherosclerotic heart disease of native coronary artery without angina pectoris: Secondary | ICD-10-CM

## 2015-12-23 DIAGNOSIS — M069 Rheumatoid arthritis, unspecified: Secondary | ICD-10-CM

## 2015-12-23 DIAGNOSIS — N189 Chronic kidney disease, unspecified: Secondary | ICD-10-CM | POA: Diagnosis not present

## 2015-12-23 DIAGNOSIS — M25531 Pain in right wrist: Secondary | ICD-10-CM

## 2015-12-23 DIAGNOSIS — G459 Transient cerebral ischemic attack, unspecified: Secondary | ICD-10-CM

## 2015-12-23 DIAGNOSIS — K449 Diaphragmatic hernia without obstruction or gangrene: Secondary | ICD-10-CM | POA: Diagnosis not present

## 2015-12-23 DIAGNOSIS — R7989 Other specified abnormal findings of blood chemistry: Secondary | ICD-10-CM

## 2015-12-23 DIAGNOSIS — Z7901 Long term (current) use of anticoagulants: Secondary | ICD-10-CM

## 2015-12-23 DIAGNOSIS — Z0000 Encounter for general adult medical examination without abnormal findings: Secondary | ICD-10-CM

## 2015-12-23 LAB — CBC WITH DIFFERENTIAL/PLATELET
BASOS PCT: 1 %
Basophils Absolute: 135 cells/uL (ref 0–200)
EOS ABS: 405 {cells}/uL (ref 15–500)
Eosinophils Relative: 3 %
HEMATOCRIT: 41 % (ref 35.0–45.0)
HEMOGLOBIN: 13.6 g/dL (ref 11.7–15.5)
LYMPHS PCT: 37 %
Lymphs Abs: 4995 cells/uL — ABNORMAL HIGH (ref 850–3900)
MCH: 30.3 pg (ref 27.0–33.0)
MCHC: 33.2 g/dL (ref 32.0–36.0)
MCV: 91.3 fL (ref 80.0–100.0)
MONO ABS: 1485 {cells}/uL — AB (ref 200–950)
MPV: 12.4 fL (ref 7.5–12.5)
Monocytes Relative: 11 %
Neutro Abs: 6480 cells/uL (ref 1500–7800)
Neutrophils Relative %: 48 %
Platelets: 452 10*3/uL — ABNORMAL HIGH (ref 140–400)
RBC: 4.49 MIL/uL (ref 3.80–5.10)
RDW: 15.4 % — AB (ref 11.0–15.0)
WBC: 13.5 10*3/uL — ABNORMAL HIGH (ref 3.8–10.8)

## 2015-12-23 LAB — BASIC METABOLIC PANEL
BUN: 31 mg/dL — AB (ref 6–23)
CALCIUM: 9.3 mg/dL (ref 8.4–10.5)
CO2: 30 mEq/L (ref 19–32)
Chloride: 104 mEq/L (ref 96–112)
Creatinine, Ser: 1.31 mg/dL — ABNORMAL HIGH (ref 0.40–1.20)
GFR: 41.79 mL/min — AB (ref >60.00)
Glucose, Bld: 85 mg/dL (ref 70–99)
POTASSIUM: 4.4 meq/L (ref 3.5–5.1)
SODIUM: 141 meq/L (ref 135–145)

## 2015-12-23 LAB — URINALYSIS
Bilirubin Urine: NEGATIVE
Hgb urine dipstick: NEGATIVE
KETONES UR: NEGATIVE
LEUKOCYTES UA: NEGATIVE
Nitrite: NEGATIVE
PH: 6.5 (ref 5.0–8.0)
Total Protein, Urine: NEGATIVE
URINE GLUCOSE: NEGATIVE
Urobilinogen, UA: 0.2 (ref 0.0–1.0)

## 2015-12-23 LAB — LIPID PANEL
CHOLESTEROL: 178 mg/dL (ref 0–200)
HDL: 87.7 mg/dL (ref >39.00)
LDL CALC: 73 mg/dL (ref 0–99)
NonHDL: 89.99
TRIGLYCERIDES: 86 mg/dL (ref 0.0–149.0)
Total CHOL/HDL Ratio: 2
VLDL: 17.2 mg/dL (ref 0.0–40.0)

## 2015-12-23 LAB — HEPATIC FUNCTION PANEL
ALBUMIN: 4 g/dL (ref 3.5–5.2)
ALT: 29 U/L (ref 0–35)
AST: 20 U/L (ref 0–37)
Alkaline Phosphatase: 105 U/L (ref 39–117)
BILIRUBIN TOTAL: 0.7 mg/dL (ref 0.2–1.2)
Bilirubin, Direct: 0.2 mg/dL (ref 0.0–0.3)
Total Protein: 6.5 g/dL (ref 6.0–8.3)

## 2015-12-23 LAB — TSH: TSH: 1.65 u[IU]/mL (ref 0.35–4.50)

## 2015-12-23 MED ORDER — VITAMIN D3 50 MCG (2000 UT) PO CAPS: 2000.0000 [IU] | ORAL_CAPSULE | Freq: Every day | ORAL | 3 refills | Status: DC

## 2015-12-23 NOTE — Assessment & Plan Note (Signed)
On Arava 

## 2015-12-23 NOTE — Addendum Note (Signed)
Addended by: Harl Bowie on: 12/23/2015 10:22 AM   Modules accepted: Orders

## 2015-12-23 NOTE — Assessment & Plan Note (Signed)
No sx's 

## 2015-12-23 NOTE — Assessment & Plan Note (Signed)
Resolved w/Prednisone Labs ok

## 2015-12-23 NOTE — Assessment & Plan Note (Signed)
B 12

## 2015-12-23 NOTE — Progress Notes (Signed)
Pre visit review using our clinic review tool, if applicable. No additional management support is needed unless otherwise documented below in the visit note. 

## 2015-12-23 NOTE — Assessment & Plan Note (Signed)
Stage 3 Continue to monitor

## 2015-12-23 NOTE — Progress Notes (Signed)
Subjective:  Patient ID: [name], female    DOB: [birth year]  Age: 75 y.o. MRN: [id number]  CC: Follow-up (4 mos)   HPI KAIRY BOLAM presents for CAD, HTN, RA f/u. R wrist swelling has resolved. The pt had last labs w/Dr Charlestine Night on June 20 - ok  Outpatient Medications Prior to Visit  Medication Sig Dispense Refill  . acetaminophen (TYLENOL) 500 MG tablet Take 500 mg by mouth every 6 (six) hours as needed for mild pain.    [name] atorvastatin (LIPITOR) 80 MG tablet Take 1 tablet (80 mg total) by mouth daily. 90 tablet 3  . BIOTIN PO Take 1 each by mouth daily.    . clopidogrel (PLAVIX) 75 MG tablet Take 1 tablet by mouth  daily 90 tablet 3  . leflunomide (ARAVA) 20 MG tablet Take 1 tablet (20 mg total) by mouth every other day. 15 tablet 6  . levothyroxine (SYNTHROID, LEVOTHROID) 88 MCG tablet Take 1 tablet by mouth  daily 90 tablet 3  . loperamide (IMODIUM A-D) 2 MG tablet Take 2 mg by mouth 4 (four) times daily as needed for diarrhea or loose stools.    [name] losartan (COZAAR) 25 MG tablet Take 1 tablet by mouth  daily 90 tablet 3  . metoprolol succinate (TOPROL-XL) 25 MG 24 hr tablet Take 1 tablet by mouth  daily 90 tablet 3  . predniSONE (DELTASONE) 10 MG tablet Prednisone 10 mg: take 4 tabs a day x 3 days; then 3 tabs a day x 4 days; then 2 tabs a day x 4 days, then 1 tab a day x 6 days, then stop. Take pc. 38 tablet 1  . Probiotic Product (ALIGN) 4 MG CAPS Take 1 capsule by mouth daily. 30 capsule 1  . psyllium (METAMUCIL) 58.6 % packet Take 1 packet by mouth daily.    . traMADol (ULTRAM) 50 MG tablet Take 1-2 tablets (50-100 mg total) by mouth every 6 (six) hours as needed (mild pain). 30 tablet 0  . amoxicillin-clavulanate (AUGMENTIN) 875-125 MG tablet Take 1 tablet by mouth as needed (ongoing therapy). Reported on 06/10/2015     No facility-administered medications prior to visit.     ROS Review of Systems  Constitutional: Negative for activity change, appetite change, chills,  fatigue and unexpected weight change.  HENT: Negative for congestion, mouth sores and sinus pressure.   Eyes: Negative for visual disturbance.  Respiratory: Negative for cough and chest tightness.   Gastrointestinal: Negative for abdominal pain and nausea.  Genitourinary: Negative for difficulty urinating, frequency and vaginal pain.  Musculoskeletal: Positive for arthralgias. Negative for back pain and gait problem.  Skin: Negative for pallor and rash.  Neurological: Negative for dizziness, tremors, weakness, numbness and headaches.  Psychiatric/Behavioral: Negative for confusion, sleep disturbance and suicidal ideas.    Objective:  BP 126/78 (BP Location: Right Arm, Patient Position: Sitting, Cuff Size: Normal)   Pulse 73   Temp 98.1 F (36.7 C) (Oral)   Ht 5\' 3"  (1.6 m)   Wt 157 lb 1.9 oz (71.3 kg)   SpO2 94%   BMI 27.83 kg/m   BP Readings from Last 3 Encounters:  12/23/15 126/78  12/14/15 130/88  11/23/15 110/64    Wt Readings from Last 3 Encounters:  12/23/15 157 lb 1.9 oz (71.3 kg)  12/14/15 157 lb (71.2 kg)  08/20/15 157 lb (71.2 kg)    Physical Exam  Constitutional: She appears well-developed. No distress.  HENT:  Head: Normocephalic.  Right Ear:  External ear normal.  Left Ear: External ear normal.  Nose: Nose normal.  Mouth/Throat: Oropharynx is clear and moist.  Eyes: Conjunctivae are normal. Pupils are equal, round, and reactive to light. Right eye exhibits no discharge. Left eye exhibits no discharge.  Neck: Normal range of motion. Neck supple. No JVD present. No tracheal deviation present. No thyromegaly present.  Cardiovascular: Normal rate, regular rhythm and normal heart sounds.   Pulmonary/Chest: No stridor. No respiratory distress. She has no wheezes.  Abdominal: Soft. Bowel sounds are normal. She exhibits no distension and no mass. There is no tenderness. There is no rebound and no guarding.  Musculoskeletal: She exhibits no edema or tenderness.    Lymphadenopathy:    She has no cervical adenopathy.  Neurological: She displays normal reflexes. No cranial nerve deficit. She exhibits normal muscle tone. Coordination normal.  Skin: No rash noted. No erythema.  Psychiatric: She has a normal mood and affect. Her behavior is normal. Judgment and thought content normal.    Lab Results  Component Value Date   WBC 9.5 04/13/2015   HGB 11.7 (L) 04/13/2015   HCT 36.9 04/13/2015   PLT 398 04/13/2015   GLUCOSE 106 (H) 12/14/2015   CHOL 170 10/19/2015   TRIG 83 10/19/2015   HDL 80 10/19/2015   LDLDIRECT 162.1 10/28/2009   LDLCALC 73 10/19/2015   ALT 37 (H) 10/19/2015   AST 33 10/19/2015   NA 137 12/14/2015   K 4.7 12/14/2015   CL 104 12/14/2015   CREATININE 1.47 (H) 12/14/2015   BUN 30 (H) 12/14/2015   CO2 24 12/14/2015   TSH 1.459 04/27/2015   INR 1.00 02/26/2015    Ct Chest Wo Contrast  Result Date: 11/23/2015 CLINICAL DATA:  Followup left lung nodule. Previous wedge resection revealed necrotizing granuloma. EXAM: CT CHEST WITHOUT CONTRAST TECHNIQUE: Multidetector CT imaging of the chest was performed following the standard protocol without IV contrast. COMPARISON:  01/29/2015 FINDINGS: Cardiovascular: Heart size within normal limits. Aortic atherosclerosis noted. Previous CABG. Mediastinum/Lymph Nodes: No masses or pathologically enlarged lymph nodes identified on this un-enhanced exam. Lungs/Pleura: Postop changes seen in the medial left upper lobe from previous wedge resection. 8 mm mean diameter perifissural nodule nodule is seen in the left midlung on image 66/4. This is new since previous study, but likely represents an intrapulmonary lymph node. Mild centrilobular emphysema noted. Bibasilar scarring and tiny sub-cm posterior right lower lobe pulmonary nodule remains stable. No evidence of acute infiltrate or pleural effusion. Upper abdomen: No acute findings. Musculoskeletal: No chest wall mass or suspicious bone lesions  identified. IMPRESSION: Postop changes in left upper lobe. 8 mm mean diameter perifissural nodule in the left midlung is new, but likely represents an intrapulmonary lymph node. Non-contrast chest CT at 6-12 months is recommended. If the nodule is stable at time of repeat CT, then future CT at 18-24 months (from today's scan) is considered optional for low-risk patients, but is recommended for high-risk patients. This recommendation follows the consensus statement: Guidelines for Management of Incidental Pulmonary Nodules Detected on CT Images:From the Fleischner Society 2017; published online before print (10.1148/radiol.SG:5268862). Aortic atherosclerosis and emphysema. Electronically Signed   By: Earle Gell M.D.   On: 11/23/2015 15:46   Assessment & Plan:   There are no diagnoses linked to this encounter. I am having Ms. Howle maintain her psyllium, leflunomide, ALIGN, amoxicillin-clavulanate, loperamide, traMADol, acetaminophen, levothyroxine, losartan, clopidogrel, metoprolol succinate, atorvastatin, BIOTIN PO, and predniSONE.  No orders of the defined types were placed in  this encounter.    Follow-up: No Follow-up on file.  [name], MD

## 2015-12-23 NOTE — Assessment & Plan Note (Signed)
Plavix 

## 2015-12-23 NOTE — Assessment & Plan Note (Signed)
Losartan, Metoprolol, Lipitor, Plavix 

## 2016-01-08 ENCOUNTER — Other Ambulatory Visit: Payer: Self-pay | Admitting: Internal Medicine

## 2016-02-03 ENCOUNTER — Ambulatory Visit (INDEPENDENT_AMBULATORY_CARE_PROVIDER_SITE_OTHER): Payer: Medicare Other | Admitting: Gastroenterology

## 2016-02-03 ENCOUNTER — Encounter: Payer: Self-pay | Admitting: Gastroenterology

## 2016-02-03 ENCOUNTER — Telehealth: Payer: Self-pay

## 2016-02-03 VITALS — BP 100/68 | HR 96 | Ht 63.0 in | Wt 157.1 lb

## 2016-02-03 DIAGNOSIS — Z86010 Personal history of colonic polyps: Secondary | ICD-10-CM

## 2016-02-03 DIAGNOSIS — R159 Full incontinence of feces: Secondary | ICD-10-CM

## 2016-02-03 DIAGNOSIS — R197 Diarrhea, unspecified: Secondary | ICD-10-CM

## 2016-02-03 DIAGNOSIS — R935 Abnormal findings on diagnostic imaging of other abdominal regions, including retroperitoneum: Secondary | ICD-10-CM

## 2016-02-03 MED ORDER — NA SULFATE-K SULFATE-MG SULF 17.5-3.13-1.6 GM/177ML PO SOLN: 1.0000 | Freq: Once | ORAL | 0 refills | Status: AC

## 2016-02-03 NOTE — Patient Instructions (Signed)
If you are age 77 or older, your body mass index should be between 23-30. Your Body mass index is 27.83 kg/m. If this is out of the aforementioned range listed, please consider follow up with your Primary Care Provider.  If you are age 18 or younger, your body mass index should be between 19-25. Your Body mass index is 27.83 kg/m. If this is out of the aformentioned range listed, please consider follow up with your Primary Care Provider.   You have been scheduled for a colonoscopy. Please follow written instructions given to you at your visit today.  Please pick up your prep supplies at the pharmacy within the next 1-3 days. If you use inhalers (even only as needed), please bring them with you on the day of your procedure. Your physician has requested that you go to www.startemmi.com and enter the access code given to you at your visit today. This web site gives a general overview about your procedure. However, you should still follow specific instructions given to you by our office regarding your preparation for the procedure.  We have sent the following medications to your pharmacy for you to pick up at your convenience: [place] will be contaced by our office prior to your procedure for directions on holding your Plavix.  If you do not hear from our office 1 week prior to your scheduled procedure, please call [phone] to discuss.

## 2016-02-03 NOTE — Telephone Encounter (Signed)
02/03/2016   RE: [name] DOB: [birth year] MRN: [id number]   Dear  Dr Ellyn Hack,    We have scheduled the above patient for an endoscopic procedure (EGD). Our records show that she is on anticoagulation therapy.   Please advise as to how long the patient may come off her therapy of Plavix prior to the procedure, which is scheduled for 03/29/16.  Please fax back/ or route the completed form to [facility] at [phone].   Sincerely,    Caryl Pina, LPN

## 2016-02-03 NOTE — Progress Notes (Signed)
HPI :  75 y/o female here to discuss possible surveillance colonoscopy. 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    CVA was in August 2015. She has some baseline dyspnea from COPD. She is not on oxygen. No chest pains. She has no functional limitations due to cardiovascular issues. She had a benign nodule removed from her lung within the past year. She reports she smoked from the age of 48 to 53.  She denies any blood in the stools. She reports some loose stools. She has roughly 2-3 BMs per day, usually normal form, sometimes sporadic loose stools after she eats in a restaurant. She has some urgency with some fecal incontinence at times. She reports incontinence due to urge sporadically. This has been ongoing for a long time and getting worse. She takes immodium PRN which does help. She will take it once or twice per week. No weight loss. No abdominal pains. Eating well. No nausea or vomiting postprandially. She is taking a fiber supplement daily. No FH of colon cancer.  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   Colonoscopy 12/27/2010 - one small descending tubular adenoma, diverticulosis Colonoscopy 07/16/2000 - normal exam  Past Medical History:  Diagnosis Date  . Abdominal aortic aneurysm (HCC)    REPAIRED IN 1993 BY [name]  AND HAS RECENTLY BEEN FOLLOWED BY [name]  . 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 Adenomatous colon polyp    tubular  . Anemia   . CAD in native artery 1996, 2002, 2005    Status post CABG x1 with LIMA-LAD for ostial LAD 90% stenosis --> down to 50% in 2002 and 30% in 2005.;  Atretic LIMA; Myoview 06/2010: Fixed anteroseptal, apical and inferoapical defect with moderate size. Most likely scar. Mild  subendocardial ischemia. EF 71% LOW RISK.   [name] Cervical disc disease   . Chronic kidney disease   . COPD (chronic obstructive pulmonary disease) ([place])   . Diverticulosis   . Hyperlipidemia   . Hypertension   . Hypothyroidism (acquired)    hypo  . Myocardial infarction Aug. 2016   TIA  . Polymyalgia rheumatica (HCC)    2011 Dr. Charlestine Night  . Rheumatoid arthritis ([place]) 2011   Dr.Truslow; fracture knees, hands and wrists -   . S/P CABG x 1 1996   CABG--LIMA-LAD for ostial LAD (not felt to be PCI amenable). EF NORMAL then; LIMA now atretic  . Shortness of breath dyspnea    with exertion  . Stroke [facility]) 11-2014   TIA   . Urinary frequency      Past Surgical History:  Procedure Laterality Date  . ABDOMINAL AORTIC ANEURYSM REPAIR  3235   Complicated by mesenteric artery stenosis and splenic artery infarction with acquired Asplenia  . BUNIONECTOMY  07/2011   right foot  . CARDIAC CATHETERIZATION  2005   (Most recent CATH) - ostial LAD lesion 20-30% (down from 90% initially). Atretic LIMA. Minimal disease the RCA and Circumflex system.  [name] CARPAL TUNNEL RELEASE Left   . CATARACT EXTRACTION Bilateral   . CERVICAL SPINE SURGERY     plate 2008 Dr. Saintclair Halsted  . [place]  ARTERY TO THE LAD. EF WAS NORMAL  . INGUINAL HERNIA REPAIR Right   . NM MYOVIEW LTD  March 2012   Fixed anteroseptal, apical and inferoapical defect with moderate size. Most likely scar. Mild subendocardial ischemia. EF 71% LOW RISK.   [name] SPLENECTOMY    . TRANSTHORACIC ECHOCARDIOGRAM  12/2014   [facility]: Normal LV size & function. EF 55-60%,   . vagina polyp    . VIDEO ASSISTED THORACOSCOPY (VATS)/WEDGE RESECTION Left 03/02/2015   Procedure: VIDEO ASSISTED THORACOSCOPY (VATS), MINI THORACOTOMY, LEFT UPPER LOBE WEDGE, TAKE DOWN OF INTERNAL MAMMARY LESIONS, PLACEMENT OF ON-Q PUMP;  Surgeon: Grace Isaac, MD;  Location: [place];  Service: Thoracic;  Laterality:  Left;  [name] VIDEO BRONCHOSCOPY N/A 03/02/2015   Procedure: BRONCHOSCOPY;  Surgeon: Grace Isaac, MD;  Location: [facility] OR;  Service: Thoracic;  Laterality: N/A;   Family History  Problem Relation Age of Onset  . Hypertension Mother   . Diabetes Mother   . Heart disease Mother   . Hyperlipidemia Mother   . Stroke Father   . Hyperlipidemia Sister   . Hypertension Sister   . Hypertension Daughter   . Cancer Paternal Uncle     Deceased from cancer not sure of site  . Hypertension Son   . Hyperlipidemia Son   . Hyperlipidemia Son   . Hypertension Son   . Hyperlipidemia Son   . Hypertension Son   . Hypertension    . Coronary artery disease    . Asthma Neg Hx   . Colon cancer Neg Hx    Social History  Substance Use Topics  . Smoking status: Former Smoker    Quit date: 12/04/1958  . Smokeless tobacco: Never Used  . Alcohol use No   Current Outpatient Prescriptions  Medication Sig Dispense Refill  . acetaminophen (TYLENOL) 500 MG tablet Take 500 mg by mouth every 6 (six) hours as needed for mild pain.    [name] amoxicillin-clavulanate (AUGMENTIN) 875-125 MG tablet Take 1 tablet by mouth as needed (ongoing therapy). Reported on 06/10/2015    . atorvastatin (LIPITOR) 80 MG tablet Take 1 tablet by mouth  daily 90 tablet 3  . BIOTIN PO Take 1 each by mouth daily.    . Cholecalciferol (VITAMIN D3) 2000 units capsule Take 1 capsule (2,000 Units total) by mouth daily. 100 capsule 3  . clopidogrel (PLAVIX) 75 MG tablet Take 1 tablet by mouth  daily 90 tablet 3  . leflunomide (ARAVA) 20 MG tablet Take 1 tablet (20 mg total) by mouth every other day. 15 tablet 6  . levothyroxine (SYNTHROID, LEVOTHROID) 88 MCG tablet Take 1 tablet by mouth  daily 90 tablet 3  . loperamide (IMODIUM A-D) 2 MG tablet Take 2 mg by mouth 4 (four) times daily as needed for diarrhea or loose stools.    [name] losartan (COZAAR) 25 MG tablet Take 1 tablet by mouth  daily 90 tablet 3  . metoprolol succinate (TOPROL-XL) 25 MG 24 hr  tablet Take 1 tablet by mouth  daily 90 tablet 3  . Probiotic Product (ALIGN) 4 MG CAPS Take 1 capsule by mouth daily. 30 capsule 1  . psyllium (METAMUCIL) 58.6 % packet Take 1 packet by mouth daily.    . traMADol (ULTRAM) 50 MG tablet Take 1-2 tablets (50-100 mg total) by mouth every 6 (six) hours as needed (mild pain). 30 tablet 0   No current facility-administered medications for this visit.    Allergies  Allergen Reactions  .  Aspirin Other (See Comments)    Hurts stomach  . Codeine Nausea And Vomiting  . Nitroglycerin Other (See Comments)    Heart rate drops     Review of Systems: All systems reviewed and negative except where noted in HPI.    Lab Results  Component Value Date   WBC 13.5 (H) 12/23/2015   HGB 13.6 12/23/2015   HCT 41.0 12/23/2015   MCV 91.3 12/23/2015   PLT 452 (H) 12/23/2015    Lab Results  Component Value Date   CREATININE 1.31 (H) 12/23/2015   BUN 31 (H) 12/23/2015   NA 141 12/23/2015   K 4.4 12/23/2015   CL 104 12/23/2015   CO2 30 12/23/2015    Lab Results  Component Value Date   ALT 29 12/23/2015   AST 20 12/23/2015   ALKPHOS 105 12/23/2015   BILITOT 0.7 12/23/2015      Physical Exam: BP 100/68 (BP Location: Left Arm, Patient Position: Sitting, Cuff Size: Normal)   Pulse 96 Comment: irregular  Ht 5\' 3"  (1.6 m) Comment: height measured without shoes  Wt 157 lb 2 oz (71.3 kg)   BMI 27.83 kg/m  Constitutional: Pleasant,well-developed, female in no acute distress. HEENT: Normocephalic and atraumatic. Conjunctivae are normal. No scleral icterus. Neck supple.  Cardiovascular: Normal rate, regular rhythm.  Pulmonary/chest: Effort normal and breath sounds normal. No wheezing Abdominal: Soft, nondistended, nontender. There are no masses palpable.  Extremities: no edema Lymphadenopathy: No cervical adenopathy noted. Neurological: Alert and oriented to person place and time. Skin: Skin is warm and dry. No rashes noted. Psychiatric:  Normal mood and affect. Behavior is normal.   ASSESSMENT AND PLAN: 75 y/o female with extensive medical history to include COPD, CAD s/p CABG, AAA repair / PVD, TIA / CVA on plavix, here for reassessment for surveillance colonoscopy. She had a small adenoma removed 5 years ago and is due for surveillance at this time, although we discussed that with her medical history and at her age, it's not unreasonable to stop further surveillance at this time. However, she has had some ongoing intermittent diarrhea with what sounds like urge incontinence, getting worse and more frequent over time, and following a discussion of all of these issues she strongly wished to have a colonoscopy to evaluate this and for her surveillance. I discussed risks / benefits of the endoscopy and anesthesia with her. She will need to hold Plavix for 5 days prior to the procedure, although can take an aspirin during this time if needed. We will reach out to her prescribing provider to see if it is okay to hold the Plavix. Further recommendations pending the results of this exam. She verbalized understanding of risks and wished to proceed.    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 She denies any abdominal pain at present, but will have low threshold to contact us should she develop abdominal pains.   Marlton Cellar, MD [facility] Pager [phone]  CC: Plotnikov, [name], MD

## 2016-02-16 NOTE — Telephone Encounter (Signed)
Okay to hold Plavix 5-7 days preprocedure.   Glenetta Hew, MD

## 2016-02-17 NOTE — Telephone Encounter (Signed)
Pt informed to hold Plavix 5d prior to procedure.

## 2016-02-29 ENCOUNTER — Telehealth: Payer: Self-pay

## 2016-02-29 ENCOUNTER — Encounter: Payer: Self-pay | Admitting: Gastroenterology

## 2016-02-29 NOTE — Telephone Encounter (Signed)
Spoke to patient, let her know that I can see her appointment for her colonoscopy in system for 11/29 @ [facility].

## 2016-03-15 ENCOUNTER — Encounter: Payer: Self-pay | Admitting: Gastroenterology

## 2016-03-28 ENCOUNTER — Telehealth: Payer: Self-pay | Admitting: Gastroenterology

## 2016-03-28 NOTE — Telephone Encounter (Signed)
Patient c/o having cough, congestion, and runny nose.  States that she doesn't have a thermometer to check her temperature but doesn't think she has one.  I advised her that we could still do her procedure tomorrow as long as she is not running a temperature of 100+ and that her symptoms do not worsen.  I asked her to call if she starts feeling worse.  She agreed to do so.

## 2016-03-29 ENCOUNTER — Ambulatory Visit (AMBULATORY_SURGERY_CENTER): Payer: Medicare Other | Admitting: Gastroenterology

## 2016-03-29 ENCOUNTER — Encounter: Payer: Self-pay | Admitting: Gastroenterology

## 2016-03-29 VITALS — BP 141/80 | HR 79 | Temp 97.3°F | Resp 21 | Ht 63.0 in | Wt 157.0 lb

## 2016-03-29 DIAGNOSIS — D121 Benign neoplasm of appendix: Secondary | ICD-10-CM

## 2016-03-29 DIAGNOSIS — D124 Benign neoplasm of descending colon: Secondary | ICD-10-CM | POA: Diagnosis not present

## 2016-03-29 DIAGNOSIS — Z86010 Personal history of colonic polyps: Secondary | ICD-10-CM

## 2016-03-29 DIAGNOSIS — R197 Diarrhea, unspecified: Secondary | ICD-10-CM | POA: Diagnosis not present

## 2016-03-29 DIAGNOSIS — D120 Benign neoplasm of cecum: Secondary | ICD-10-CM

## 2016-03-29 DIAGNOSIS — K635 Polyp of colon: Secondary | ICD-10-CM

## 2016-03-29 DIAGNOSIS — D125 Benign neoplasm of sigmoid colon: Secondary | ICD-10-CM | POA: Diagnosis not present

## 2016-03-29 DIAGNOSIS — D122 Benign neoplasm of ascending colon: Secondary | ICD-10-CM | POA: Diagnosis not present

## 2016-03-29 MED ORDER — SODIUM CHLORIDE 0.9 % IV SOLN: 500.0000 mL | INTRAVENOUS | Status: DC

## 2016-03-29 NOTE — Op Note (Signed)
[facility] Patient Name: [name] Procedure Date: 03/29/2016 10:21 AM MRN: [id number] Endoscopist: Remo Lipps P. Armbruster MD, MD Age: 75 Referring MD:  Date of Birth: [birth year] Gender: Female Account #: [id number] Procedure:                Colonoscopy Indications:              High risk colon cancer surveillance: Personal                            history of colonic polyps, also with intermittent                            loose stools / fecal incontinence Medicines:                Monitored Anesthesia Care Procedure:                Pre-Anesthesia Assessment:                           - Prior to the procedure, a History and Physical                            was performed, and patient medications and                            allergies were reviewed. The patient's tolerance of                            previous anesthesia was also reviewed. The risks                            and benefits of the procedure and the sedation                            options and risks were discussed with the patient.                            All questions were answered, and informed consent                            was obtained. Prior Anticoagulants: The patient has                            taken Plavix (clopidogrel), last dose was 5 days                            prior to procedure. ASA Grade Assessment: III - A                            patient with severe systemic disease. After                            reviewing the risks and benefits, the patient was  deemed in satisfactory condition to undergo the                            procedure.                           After obtaining informed consent, the colonoscope                            was passed under direct vision. Throughout the                            procedure, the patient's blood pressure, pulse, and                            oxygen saturations were monitored continuously. The                        Model CF-HQ190L [id number]) scope was introduced                            through the anus and advanced to the the terminal                            ileum, with identification of the appendiceal                            orifice and IC valve. The colonoscopy was performed                            without difficulty. The patient tolerated the                            procedure well. The quality of the bowel                            preparation was adequate. The terminal ileum,                            ileocecal valve, appendiceal orifice, and rectum                            were photographed. Scope In: 10:28:39 AM Scope Out: 10:46:45 AM Scope Withdrawal Time: 0 hours 12 minutes 11 seconds  Total Procedure Duration: 0 hours 18 minutes 6 seconds  Findings:                 The perianal and digital rectal examinations were                            normal.                           A 5 mm polyp was found in the cecum. The polyp was  sessile. The polyp was removed with a cold snare.                            Resection and retrieval were complete.                           A medium polyp was found in the appendiceal                            orifice, the borders could not be seen and appeared                            to be invading the appendix. The polyp was sessile.                            Biopsies were taken with a cold forceps for                            histology.                           A 8 mm polyp was found in the ascending colon. The                            polyp was sessile. The polyp was removed with a                            cold snare. Resection and retrieval were complete.                           A 5 mm polyp was found in the descending colon. The                            polyp was sessile. The polyp was removed with a                            cold snare. Resection and retrieval were complete.                            A 4 mm polyp was found in the sigmoid colon. The                            polyp was sessile. The polyp was removed with a                            cold snare. Resection and retrieval were complete.                           The terminal ileum appeared normal.                           Multiple medium-mouthed diverticula were found in  the left colon.                           Internal hemorrhoids were found during retroflexion.                           The exam was otherwise without abnormality. No                            inflammatory changes were noted.                           Biopsies for histology were taken with a cold                            forceps from the right colon and left colon for                            evaluation of microscopic colitis. Complications:            No immediate complications. Estimated blood loss:                            Minimal. Estimated Blood Loss:     Estimated blood loss was minimal. Impression:               - One 5 mm polyp in the cecum, removed with a cold                            snare. Resected and retrieved.                           - One medium polyp at the appendiceal orifice,                            appeared to be invading the appendix. Biopsied.                           - One 8 mm polyp in the ascending colon, removed                            with a cold snare. Resected and retrieved.                           - One 5 mm polyp in the descending colon, removed                            with a cold snare. Resected and retrieved.                           - One 4 mm polyp in the sigmoid colon, removed with                            a cold snare. Resected and retrieved.                           -  The examined portion of the ileum was normal.                           - Diverticulosis in the left colon.                           - Internal hemorrhoids.                            - The examination was otherwise normal.                           - Biopsies were taken with a cold forceps from the                            right colon and left colon for evaluation of                            microscopic colitis. Recommendation:           - Patient has a contact number available for                            emergencies. The signs and symptoms of potential                            delayed complications were discussed with the                            patient. Return to normal activities tomorrow.                            Written discharge instructions were provided to the                            patient.                           - Resume previous diet.                           - Continue present medications.                           - Resume Plavix in 3 days (okay to take baby                            aspirin in the interim)                           - Await pathology results.                           - Pending pathology results, I anticipate                            appendectomy  will be recommended to removal                            appendiceal polyp                           - Repeat colonoscopy is recommended for                            surveillance. The colonoscopy date will be                            determined after pathology results from today's                            exam become available for review. Remo Lipps P. Armbruster MD, MD 03/29/2016 10:53:08 AM This report has been signed electronically.

## 2016-03-29 NOTE — Patient Instructions (Signed)
Impression/recommendations:  Polyps (handout given) Diverticulosis (handout given) High Fiber diet (handout given) Hemorrhoids (handout given)  Resume Plavix in 3 days (04/02/2016)  YOU HAD AN ENDOSCOPIC PROCEDURE TODAY AT [facility]:   Refer to the procedure report that was given to you for any specific questions about what was found during the examination.  If the procedure report does not answer your questions, please call your gastroenterologist to clarify.  If you requested that your care partner not be given the details of your procedure findings, then the procedure report has been included in a sealed envelope for you to review at your convenience later.  YOU SHOULD EXPECT: Some feelings of bloating in the abdomen. Passage of more gas than usual.  Walking can help get rid of the air that was put into your GI tract during the procedure and reduce the bloating. If you had a lower endoscopy (such as a colonoscopy or flexible sigmoidoscopy) you may notice spotting of blood in your stool or on the toilet paper. If you underwent a bowel prep for your procedure, you may not have a normal bowel movement for a few days.  Please Note:  You might notice some irritation and congestion in your nose or some drainage.  This is from the oxygen used during your procedure.  There is no need for concern and it should clear up in a day or so.  SYMPTOMS TO REPORT IMMEDIATELY:   Following lower endoscopy (colonoscopy or flexible sigmoidoscopy):  Excessive amounts of blood in the stool  Significant tenderness or worsening of abdominal pains  Swelling of the abdomen that is new, acute  Fever of 100F or higher  For urgent or emergent issues, a gastroenterologist can be reached at any hour by calling [phone].   DIET:  We do recommend a small meal at first, but then you may proceed to your regular diet.  Drink plenty of fluids but you should avoid alcoholic beverages for 24  hours.  ACTIVITY:  You should plan to take it easy for the rest of today and you should NOT DRIVE or use heavy machinery until tomorrow (because of the sedation medicines used during the test).    FOLLOW UP: Our staff will call the number listed on your records the next business day following your procedure to check on you and address any questions or concerns that you may have regarding the information given to you following your procedure. If we do not reach you, we will leave a message.  However, if you are feeling well and you are not experiencing any problems, there is no need to return our call.  We will assume that you have returned to your regular daily activities without incident.  If any biopsies were taken you will be contacted by phone or by letter within the next 1-3 weeks.  Please call us at [phone] if you have not heard about the biopsies in 3 weeks.    SIGNATURES/CONFIDENTIALITY: You and/or your care partner have signed paperwork which will be entered into your electronic medical record.  These signatures attest to the fact that that the information above on your After Visit Summary has been reviewed and is understood.  Full responsibility of the confidentiality of this discharge information lies with you and/or your care-partner.

## 2016-03-29 NOTE — Progress Notes (Signed)
Called to room to assist during endoscopic procedure.  Patient ID and intended procedure confirmed with present staff. Received instructions for my participation in the procedure from the performing physician.  

## 2016-03-30 ENCOUNTER — Telehealth: Payer: Self-pay

## 2016-03-30 NOTE — Telephone Encounter (Signed)
  Follow up Call-  Call back number 03/29/2016  Post procedure Call Back phone  # [phone]  Permission to leave phone message Yes  Some recent data might be hidden     Patient states that when she went to the bathroom last night she did see blood in the toilet. She has been to the bathroom again several times and did not see any blood. I instructed the patient to call the office if it happens again or to go to the emergency room. The patient states she feels fine and agreed with the plan.    Patient questions:  Do you have a fever, pain , or abdominal swelling? No. Pain Score  0 *  Have you tolerated food without any problems? Yes.    Have you been able to return to your normal activities? Yes.    Do you have any questions about your discharge instructions: Diet   No. Medications  No. Follow up visit  No.  Do you have questions or concerns about your Care? No.  Actions: * If pain score is 4 or above: No action needed, pain <4.

## 2016-04-03 ENCOUNTER — Encounter: Payer: Self-pay | Admitting: Internal Medicine

## 2016-04-03 ENCOUNTER — Ambulatory Visit (INDEPENDENT_AMBULATORY_CARE_PROVIDER_SITE_OTHER): Payer: Medicare Other | Admitting: Internal Medicine

## 2016-04-03 ENCOUNTER — Ambulatory Visit (INDEPENDENT_AMBULATORY_CARE_PROVIDER_SITE_OTHER)
Admission: RE | Admit: 2016-04-03 | Discharge: 2016-04-03 | Disposition: A | Discharge status: Home / Self Care | Payer: Medicare Other | Source: Ambulatory Visit | Attending: Internal Medicine | Admitting: Internal Medicine

## 2016-04-03 DIAGNOSIS — Q8909 Congenital malformations of spleen: Secondary | ICD-10-CM | POA: Diagnosis not present

## 2016-04-03 DIAGNOSIS — I10 Essential (primary) hypertension: Secondary | ICD-10-CM

## 2016-04-03 DIAGNOSIS — J209 Acute bronchitis, unspecified: Secondary | ICD-10-CM | POA: Insufficient documentation

## 2016-04-03 HISTORY — DX: Acute bronchitis, unspecified: J20.9

## 2016-04-03 MED ORDER — CEFDINIR 300 MG PO CAPS: 300.0000 mg | ORAL_CAPSULE | Freq: Two times a day (BID) | ORAL | 0 refills | Status: DC

## 2016-04-03 MED ORDER — HYDROCOD POLST-CPM POLST ER 10-8 MG/5ML PO SUER: 5.0000 mL | Freq: Two times a day (BID) | ORAL | 0 refills | Status: DC | PRN

## 2016-04-03 NOTE — Assessment & Plan Note (Signed)
Cefdinir to start today

## 2016-04-03 NOTE — Patient Instructions (Signed)
Use over-the-counter  "cold" medicines  such as"Afrin" nasal spray for nasal congestion as directed instead. Use " Delsym" or" Robitussin" cough syrup varietis for cough.  You can use plain "Tylenol" or "Advil" for fever, chills and achyness. Use Halls or Ricola cough drops.  Please, make an appointment if you are not better or if you're worse.  

## 2016-04-03 NOTE — Assessment & Plan Note (Signed)
CXR Omnicef Tussionex

## 2016-04-03 NOTE — Progress Notes (Signed)
Subjective:  Patient ID: [name], female    DOB: 04-Jul-1940  Age: 75 y.o. MRN: [id number]  CC: Cough (productive at times x over 1 week) and Fatigue   Cough  This is a new problem. The current episode started 1 to 4 weeks ago. The problem has been gradually worsening. The cough is productive of purulent sputum. Associated symptoms include nasal congestion and wheezing. Pertinent negatives include no chills, fever, headaches, rash or shortness of breath. She has tried OTC cough suppressant for the symptoms. The treatment provided no relief. There is no history of asthma.   Barbaraann Boys presents for cough   Outpatient Medications Prior to Visit  Medication Sig Dispense Refill  . acetaminophen (TYLENOL) 500 MG tablet Take 500 mg by mouth every 6 (six) hours as needed for mild pain.    [name] atorvastatin (LIPITOR) 80 MG tablet Take 1 tablet by mouth  daily 90 tablet 3  . BIOTIN PO Take 1 each by mouth daily.    . Cholecalciferol (VITAMIN D3) 2000 units capsule Take 1 capsule (2,000 Units total) by mouth daily. 100 capsule 3  . clopidogrel (PLAVIX) 75 MG tablet Take 1 tablet by mouth  daily 90 tablet 3  . leflunomide (ARAVA) 20 MG tablet Take 1 tablet (20 mg total) by mouth every other day. 15 tablet 6  . levothyroxine (SYNTHROID, LEVOTHROID) 88 MCG tablet Take 1 tablet by mouth  daily 90 tablet 3  . loperamide (IMODIUM A-D) 2 MG tablet Take 2 mg by mouth 4 (four) times daily as needed for diarrhea or loose stools.    [name] losartan (COZAAR) 25 MG tablet Take 1 tablet by mouth  daily 90 tablet 3  . metoprolol succinate (TOPROL-XL) 25 MG 24 hr tablet Take 1 tablet by mouth  daily 90 tablet 3  . Probiotic Product (ALIGN) 4 MG CAPS Take 1 capsule by mouth daily. 30 capsule 1  . psyllium (METAMUCIL) 58.6 % packet Take 1 packet by mouth daily.    . traMADol (ULTRAM) 50 MG tablet Take 1-2 tablets (50-100 mg total) by mouth every 6 (six) hours as needed (mild pain). 30 tablet 0    Facility-Administered Medications Prior to Visit  Medication Dose Route Frequency Provider Last Rate Last Dose  . 0.9 %  sodium chloride infusion  500 mL Intravenous Continuous Manus Gunning, MD        ROS Review of Systems  Constitutional: Negative for activity change, appetite change, chills, fatigue, fever and unexpected weight change.  HENT: Negative for congestion, mouth sores and sinus pressure.   Eyes: Negative for visual disturbance.  Respiratory: Positive for cough and wheezing. Negative for chest tightness and shortness of breath.   Gastrointestinal: Negative for abdominal pain and nausea.  Genitourinary: Negative for difficulty urinating, frequency and vaginal pain.  Musculoskeletal: Negative for back pain and gait problem.  Skin: Negative for pallor and rash.  Neurological: Negative for dizziness, tremors, weakness, numbness and headaches.  Psychiatric/Behavioral: Negative for confusion and sleep disturbance.    Objective:  BP 140/80   Pulse (!) 104   Temp 98.1 F (36.7 C) (Oral)   Wt 157 lb (71.2 kg)   SpO2 94%   BMI 27.81 kg/m   BP Readings from Last 3 Encounters:  04/03/16 140/80  03/29/16 (!) 141/80  02/03/16 100/68    Wt Readings from Last 3 Encounters:  04/03/16 157 lb (71.2 kg)  03/29/16 157 lb (71.2 kg)  02/03/16 157 lb 2 oz (71.3 kg)  Physical Exam  Constitutional: She appears well-developed. No distress.  HENT:  Head: Normocephalic.  Right Ear: External ear normal.  Left Ear: External ear normal.  Nose: Nose normal.  Mouth/Throat: Oropharynx is clear and moist.  Eyes: Conjunctivae are normal. Pupils are equal, round, and reactive to light. Right eye exhibits no discharge. Left eye exhibits no discharge.  Neck: Normal range of motion. Neck supple. No JVD present. No tracheal deviation present. No thyromegaly present.  Cardiovascular: Normal rate, regular rhythm and normal heart sounds.   Pulmonary/Chest: No stridor. No  respiratory distress. She has wheezes.  Abdominal: Soft. Bowel sounds are normal. She exhibits no distension and no mass. There is no tenderness. There is no rebound and no guarding.  Musculoskeletal: She exhibits no edema or tenderness.  Lymphadenopathy:    She has no cervical adenopathy.  Neurological: She displays normal reflexes. No cranial nerve deficit. She exhibits normal muscle tone. Coordination normal.  Skin: No rash noted. No erythema.  Psychiatric: She has a normal mood and affect. Her behavior is normal. Judgment and thought content normal.    Lab Results  Component Value Date   WBC 13.5 (H) 12/23/2015   HGB 13.6 12/23/2015   HCT 41.0 12/23/2015   PLT 452 (H) 12/23/2015   GLUCOSE 85 12/23/2015   CHOL 178 12/23/2015   TRIG 86.0 12/23/2015   HDL 87.70 12/23/2015   LDLDIRECT 162.1 10/28/2009   LDLCALC 73 12/23/2015   ALT 29 12/23/2015   AST 20 12/23/2015   NA 141 12/23/2015   K 4.4 12/23/2015   CL 104 12/23/2015   CREATININE 1.31 (H) 12/23/2015   BUN 31 (H) 12/23/2015   CO2 30 12/23/2015   TSH 1.65 12/23/2015   INR 1.00 02/26/2015    Ct Chest Wo Contrast  Result Date: 11/23/2015 CLINICAL DATA:  Followup left lung nodule. Previous wedge resection revealed necrotizing granuloma. EXAM: CT CHEST WITHOUT CONTRAST TECHNIQUE: Multidetector CT imaging of the chest was performed following the standard protocol without IV contrast. COMPARISON:  01/29/2015 FINDINGS: Cardiovascular: Heart size within normal limits. Aortic atherosclerosis noted. Previous CABG. Mediastinum/Lymph Nodes: No masses or pathologically enlarged lymph nodes identified on this un-enhanced exam. Lungs/Pleura: Postop changes seen in the medial left upper lobe from previous wedge resection. 8 mm mean diameter perifissural nodule nodule is seen in the left midlung on image 66/4. This is new since previous study, but likely represents an intrapulmonary lymph node. Mild centrilobular emphysema noted. Bibasilar  scarring and tiny sub-cm posterior right lower lobe pulmonary nodule remains stable. No evidence of acute infiltrate or pleural effusion. Upper abdomen: No acute findings. Musculoskeletal: No chest wall mass or suspicious bone lesions identified. IMPRESSION: Postop changes in left upper lobe. 8 mm mean diameter perifissural nodule in the left midlung is new, but likely represents an intrapulmonary lymph node. Non-contrast chest CT at 6-12 months is recommended. If the nodule is stable at time of repeat CT, then future CT at 18-24 months (from today's scan) is considered optional for low-risk patients, but is recommended for high-risk patients. This recommendation follows the consensus statement: Guidelines for Management of Incidental Pulmonary Nodules Detected on CT Images:From the Fleischner Society 2017; published online before print (10.1148/radiol.4782956213). Aortic atherosclerosis and emphysema. Electronically Signed   By: Earle Gell M.D.   On: 11/23/2015 15:46   Assessment & Plan:   There are no diagnoses linked to this encounter. I am having Ms. Wien maintain her psyllium, leflunomide, ALIGN, loperamide, traMADol, acetaminophen, levothyroxine, losartan, clopidogrel, metoprolol succinate, BIOTIN PO,  Vitamin D3, and atorvastatin. We will continue to administer sodium chloride.  No orders of the defined types were placed in this encounter.    Follow-up: No Follow-up on file.  [name], MD

## 2016-04-03 NOTE — Progress Notes (Signed)
Pre visit review using our clinic review tool, if applicable. No additional management support is needed unless otherwise documented below in the visit note. 

## 2016-04-03 NOTE — Assessment & Plan Note (Signed)
Losartan, Metoprolol

## 2016-04-04 ENCOUNTER — Telehealth: Payer: Self-pay

## 2016-04-04 NOTE — Telephone Encounter (Signed)
Faxed referral information over to [facility], asked that they schedule appointment with Dr. Leighton Ruff.

## 2016-04-06 ENCOUNTER — Telehealth: Payer: Self-pay

## 2016-04-06 NOTE — Telephone Encounter (Signed)
Appt made by [facility] for 04/26/16 at 9am.

## 2016-04-15 ENCOUNTER — Emergency Department (HOSPITAL_COMMUNITY): Payer: Medicare Other

## 2016-04-15 ENCOUNTER — Encounter: Payer: Self-pay | Admitting: Family

## 2016-04-15 ENCOUNTER — Ambulatory Visit (INDEPENDENT_AMBULATORY_CARE_PROVIDER_SITE_OTHER): Payer: Medicare Other | Admitting: Family

## 2016-04-15 ENCOUNTER — Other Ambulatory Visit: Payer: Self-pay

## 2016-04-15 ENCOUNTER — Ambulatory Visit (HOSPITAL_COMMUNITY)
Admission: RE | Admit: 2016-04-15 | Discharge: 2016-04-15 | Disposition: A | Discharge status: Home / Self Care | Payer: Medicare Other | Source: Ambulatory Visit | Attending: Family | Admitting: Family

## 2016-04-15 ENCOUNTER — Observation Stay (HOSPITAL_COMMUNITY)
Admission: EM | Admit: 2016-04-15 | Discharge: 2016-04-18 | Disposition: A | Discharge status: Home / Self Care | Payer: Medicare Other | Attending: Internal Medicine | Admitting: Internal Medicine

## 2016-04-15 ENCOUNTER — Telehealth: Payer: Self-pay | Admitting: Family

## 2016-04-15 ENCOUNTER — Other Ambulatory Visit (HOSPITAL_COMMUNITY)
Admission: RE | Admit: 2016-04-15 | Discharge: 2016-04-15 | Disposition: A | Discharge status: Home / Self Care | Payer: Medicare Other | Attending: Family | Admitting: Family

## 2016-04-15 ENCOUNTER — Encounter (HOSPITAL_COMMUNITY): Payer: Self-pay | Admitting: Emergency Medicine

## 2016-04-15 VITALS — BP 102/80 | HR 112 | Temp 98.5°F | Resp 18 | Ht 63.0 in | Wt 152.0 lb

## 2016-04-15 DIAGNOSIS — R059 Cough, unspecified: Secondary | ICD-10-CM

## 2016-04-15 DIAGNOSIS — Z9081 Acquired absence of spleen: Secondary | ICD-10-CM | POA: Insufficient documentation

## 2016-04-15 DIAGNOSIS — Z886 Allergy status to analgesic agent status: Secondary | ICD-10-CM | POA: Insufficient documentation

## 2016-04-15 DIAGNOSIS — J449 Chronic obstructive pulmonary disease, unspecified: Secondary | ICD-10-CM | POA: Insufficient documentation

## 2016-04-15 DIAGNOSIS — I2510 Atherosclerotic heart disease of native coronary artery without angina pectoris: Secondary | ICD-10-CM | POA: Insufficient documentation

## 2016-04-15 DIAGNOSIS — I129 Hypertensive chronic kidney disease with stage 1 through stage 4 chronic kidney disease, or unspecified chronic kidney disease: Secondary | ICD-10-CM | POA: Diagnosis not present

## 2016-04-15 DIAGNOSIS — Z888 Allergy status to other drugs, medicaments and biological substances status: Secondary | ICD-10-CM | POA: Insufficient documentation

## 2016-04-15 DIAGNOSIS — J9601 Acute respiratory failure with hypoxia: Secondary | ICD-10-CM

## 2016-04-15 DIAGNOSIS — Z86010 Personal history of colonic polyps: Secondary | ICD-10-CM | POA: Insufficient documentation

## 2016-04-15 DIAGNOSIS — Z885 Allergy status to narcotic agent status: Secondary | ICD-10-CM | POA: Diagnosis not present

## 2016-04-15 DIAGNOSIS — E039 Hypothyroidism, unspecified: Secondary | ICD-10-CM | POA: Insufficient documentation

## 2016-04-15 DIAGNOSIS — E279 Disorder of adrenal gland, unspecified: Secondary | ICD-10-CM | POA: Diagnosis not present

## 2016-04-15 DIAGNOSIS — I714 Abdominal aortic aneurysm, without rupture, unspecified: Secondary | ICD-10-CM | POA: Diagnosis present

## 2016-04-15 DIAGNOSIS — Z8673 Personal history of transient ischemic attack (TIA), and cerebral infarction without residual deficits: Secondary | ICD-10-CM | POA: Diagnosis not present

## 2016-04-15 DIAGNOSIS — I739 Peripheral vascular disease, unspecified: Secondary | ICD-10-CM | POA: Insufficient documentation

## 2016-04-15 DIAGNOSIS — N183 Chronic kidney disease, stage 3 unspecified: Secondary | ICD-10-CM | POA: Diagnosis present

## 2016-04-15 DIAGNOSIS — I700 Atherosclerosis of aorta: Secondary | ICD-10-CM | POA: Insufficient documentation

## 2016-04-15 DIAGNOSIS — I10 Essential (primary) hypertension: Secondary | ICD-10-CM | POA: Diagnosis not present

## 2016-04-15 DIAGNOSIS — Z87891 Personal history of nicotine dependence: Secondary | ICD-10-CM | POA: Insufficient documentation

## 2016-04-15 DIAGNOSIS — M353 Polymyalgia rheumatica: Secondary | ICD-10-CM | POA: Diagnosis not present

## 2016-04-15 DIAGNOSIS — N189 Chronic kidney disease, unspecified: Secondary | ICD-10-CM | POA: Diagnosis present

## 2016-04-15 DIAGNOSIS — J441 Chronic obstructive pulmonary disease with (acute) exacerbation: Secondary | ICD-10-CM | POA: Diagnosis present

## 2016-04-15 DIAGNOSIS — Z7902 Long term (current) use of antithrombotics/antiplatelets: Secondary | ICD-10-CM | POA: Insufficient documentation

## 2016-04-15 DIAGNOSIS — Z9889 Other specified postprocedural states: Secondary | ICD-10-CM | POA: Diagnosis not present

## 2016-04-15 DIAGNOSIS — M069 Rheumatoid arthritis, unspecified: Secondary | ICD-10-CM | POA: Diagnosis not present

## 2016-04-15 DIAGNOSIS — R0902 Hypoxemia: Secondary | ICD-10-CM

## 2016-04-15 DIAGNOSIS — I252 Old myocardial infarction: Secondary | ICD-10-CM | POA: Diagnosis not present

## 2016-04-15 DIAGNOSIS — R05 Cough: Secondary | ICD-10-CM

## 2016-04-15 DIAGNOSIS — R911 Solitary pulmonary nodule: Secondary | ICD-10-CM | POA: Insufficient documentation

## 2016-04-15 DIAGNOSIS — J9600 Acute respiratory failure, unspecified whether with hypoxia or hypercapnia: Secondary | ICD-10-CM | POA: Diagnosis present

## 2016-04-15 DIAGNOSIS — Z951 Presence of aortocoronary bypass graft: Secondary | ICD-10-CM

## 2016-04-15 HISTORY — DX: Acute respiratory failure with hypoxia: J96.01

## 2016-04-15 LAB — BASIC METABOLIC PANEL
Anion gap: 13 (ref 5–15)
BUN: 20 mg/dL (ref 6–20)
CO2: 24 mmol/L (ref 22–32)
Calcium: 9.5 mg/dL (ref 8.9–10.3)
Chloride: 100 mmol/L — ABNORMAL LOW (ref 101–111)
Creatinine, Ser: 1.31 mg/dL — ABNORMAL HIGH (ref 0.44–1.00)
GFR calc Af Amer: 44 mL/min — ABNORMAL LOW (ref >60)
GFR calc non Af Amer: 38 mL/min — ABNORMAL LOW (ref >60)
Glucose, Bld: 102 mg/dL — ABNORMAL HIGH (ref 65–99)
POTASSIUM: 4.1 mmol/L (ref 3.5–5.1)
Sodium: 137 mmol/L (ref 135–145)

## 2016-04-15 LAB — CBC WITH DIFFERENTIAL/PLATELET
Basophils Absolute: 0.2 10*3/uL — ABNORMAL HIGH (ref 0.0–0.1)
Basophils Relative: 2 %
EOS ABS: 0 10*3/uL (ref 0.0–0.7)
EOS PCT: 0 %
HCT: 38.1 % (ref 36.0–46.0)
Hemoglobin: 13 g/dL (ref 12.0–15.0)
LYMPHS ABS: 1.5 10*3/uL (ref 0.7–4.0)
Lymphocytes Relative: 18 %
MCH: 31 pg (ref 26.0–34.0)
MCHC: 34.1 g/dL (ref 30.0–36.0)
MCV: 90.9 fL (ref 78.0–100.0)
MONO ABS: 1.2 10*3/uL — AB (ref 0.1–1.0)
MONOS PCT: 14 %
Neutro Abs: 5.6 10*3/uL (ref 1.7–7.7)
Neutrophils Relative %: 65 %
PLATELETS: 642 10*3/uL — AB (ref 150–400)
RBC: 4.19 MIL/uL (ref 3.87–5.11)
RDW: 15.6 % — ABNORMAL HIGH (ref 11.5–15.5)
WBC: 8.5 10*3/uL (ref 4.0–10.5)

## 2016-04-15 LAB — URINALYSIS, ROUTINE W REFLEX MICROSCOPIC
BILIRUBIN URINE: NEGATIVE
Glucose, UA: NEGATIVE mg/dL
Hgb urine dipstick: NEGATIVE
Ketones, ur: 5 mg/dL — AB
Leukocytes, UA: NEGATIVE
NITRITE: NEGATIVE
PH: 6 (ref 5.0–8.0)
Protein, ur: NEGATIVE mg/dL
SPECIFIC GRAVITY, URINE: 1.025 (ref 1.005–1.030)

## 2016-04-15 LAB — I-STAT TROPONIN, ED: TROPONIN I, POC: 0.02 ng/mL (ref 0.00–0.08)

## 2016-04-15 LAB — I-STAT CG4 LACTIC ACID, ED: Lactic Acid, Venous: 2.23 mmol/L (ref 0.5–1.9)

## 2016-04-15 LAB — PROTIME-INR
INR: 0.96
Prothrombin Time: 12.8 seconds (ref 11.4–15.2)

## 2016-04-15 LAB — D-DIMER, QUANTITATIVE (NOT AT ARMC): D DIMER QUANT: 1.73 ug{FEU}/mL — AB (ref 0.00–0.50)

## 2016-04-15 LAB — INFLUENZA PANEL BY PCR (TYPE A & B)
INFLAPCR: NEGATIVE
Influenza B By PCR: NEGATIVE

## 2016-04-15 MED ORDER — ACETAMINOPHEN 650 MG RE SUPP: 650.0000 mg | Freq: Four times a day (QID) | RECTAL | Status: DC | PRN

## 2016-04-15 MED ORDER — METHYLPREDNISOLONE SODIUM SUCC 125 MG IJ SOLR
125.0000 mg | Freq: Once | INTRAMUSCULAR | Status: AC
  Administered 2016-04-15: 125 mg via INTRAVENOUS
  Filled 2016-04-15: qty 2

## 2016-04-15 MED ORDER — LEFLUNOMIDE 20 MG PO TABS
20.0000 mg | ORAL_TABLET | ORAL | Status: DC
  Administered 2016-04-17: 20 mg via ORAL
  Filled 2016-04-15 (×2): qty 1

## 2016-04-15 MED ORDER — IOPAMIDOL (ISOVUE-370) INJECTION 76%
100.0000 mL | Freq: Once | INTRAVENOUS | Status: AC | PRN
  Administered 2016-04-15: 80 mL via INTRAVENOUS

## 2016-04-15 MED ORDER — ACETAMINOPHEN 325 MG PO TABS: 650.0000 mg | ORAL_TABLET | Freq: Four times a day (QID) | ORAL | Status: DC | PRN

## 2016-04-15 MED ORDER — IPRATROPIUM BROMIDE 0.02 % IN SOLN
0.5000 mg | Freq: Once | RESPIRATORY_TRACT | Status: AC
  Administered 2016-04-15: 0.5 mg via RESPIRATORY_TRACT
  Filled 2016-04-15: qty 2.5

## 2016-04-15 MED ORDER — ALBUTEROL SULFATE (2.5 MG/3ML) 0.083% IN NEBU
5.0000 mg | INHALATION_SOLUTION | Freq: Once | RESPIRATORY_TRACT | Status: AC
  Administered 2016-04-15: 5 mg via RESPIRATORY_TRACT
  Filled 2016-04-15: qty 6

## 2016-04-15 MED ORDER — ALBUTEROL SULFATE HFA 108 (90 BASE) MCG/ACT IN AERS
2.0000 | INHALATION_SPRAY | Freq: Four times a day (QID) | RESPIRATORY_TRACT | 0 refills | Status: DC | PRN
Start: 2016-04-15 — End: 2016-08-17

## 2016-04-15 MED ORDER — ATORVASTATIN CALCIUM 40 MG PO TABS
80.0000 mg | ORAL_TABLET | Freq: Every day | ORAL | Status: DC
  Administered 2016-04-16 – 2016-04-18 (×3): 80 mg via ORAL
  Filled 2016-04-15 (×4): qty 2

## 2016-04-15 MED ORDER — SODIUM CHLORIDE 0.9 % IJ SOLN
INTRAMUSCULAR | Status: AC
  Filled 2016-04-15: qty 50

## 2016-04-15 MED ORDER — IOPAMIDOL (ISOVUE-370) INJECTION 76%
INTRAVENOUS | Status: AC
  Filled 2016-04-15: qty 100

## 2016-04-15 MED ORDER — LEVOTHYROXINE SODIUM 88 MCG PO TABS
88.0000 ug | ORAL_TABLET | Freq: Every day | ORAL | Status: DC
  Administered 2016-04-16 – 2016-04-18 (×3): 88 ug via ORAL
  Filled 2016-04-15 (×3): qty 1

## 2016-04-15 MED ORDER — CLOPIDOGREL BISULFATE 75 MG PO TABS
75.0000 mg | ORAL_TABLET | Freq: Every day | ORAL | Status: DC
  Administered 2016-04-16 – 2016-04-18 (×3): 75 mg via ORAL
  Filled 2016-04-15 (×3): qty 1

## 2016-04-15 MED ORDER — ORAL CARE MOUTH RINSE
15.0000 mL | Freq: Two times a day (BID) | OROMUCOSAL | Status: DC
  Administered 2016-04-16 – 2016-04-17 (×3): 15 mL via OROMUCOSAL

## 2016-04-15 MED ORDER — BENZONATATE 100 MG PO CAPS
100.0000 mg | ORAL_CAPSULE | Freq: Three times a day (TID) | ORAL | Status: DC | PRN
  Administered 2016-04-16 – 2016-04-17 (×3): 100 mg via ORAL
  Filled 2016-04-15 (×3): qty 1

## 2016-04-15 MED ORDER — LORATADINE 10 MG PO TABS
10.0000 mg | ORAL_TABLET | Freq: Every day | ORAL | Status: DC
  Administered 2016-04-16 – 2016-04-18 (×3): 10 mg via ORAL
  Filled 2016-04-15 (×3): qty 1

## 2016-04-15 MED ORDER — SODIUM CHLORIDE 0.9 % IV BOLUS (SEPSIS)
500.0000 mL | Freq: Once | INTRAVENOUS | Status: AC
  Administered 2016-04-15: 500 mL via INTRAVENOUS

## 2016-04-15 MED ORDER — BENZONATATE 100 MG PO CAPS: 100.0000 mg | ORAL_CAPSULE | Freq: Three times a day (TID) | ORAL | 0 refills | Status: DC | PRN

## 2016-04-15 MED ORDER — METOPROLOL SUCCINATE ER 25 MG PO TB24
25.0000 mg | ORAL_TABLET | Freq: Every day | ORAL | Status: DC
  Administered 2016-04-16 – 2016-04-18 (×4): 25 mg via ORAL
  Filled 2016-04-15 (×4): qty 1

## 2016-04-15 MED ORDER — ENOXAPARIN SODIUM 40 MG/0.4ML ~~LOC~~ SOLN
40.0000 mg | SUBCUTANEOUS | Status: DC
  Administered 2016-04-15 – 2016-04-17 (×3): 40 mg via SUBCUTANEOUS
  Filled 2016-04-15 (×3): qty 0.4

## 2016-04-15 MED ORDER — ALBUTEROL SULFATE (2.5 MG/3ML) 0.083% IN NEBU
2.5000 mg | INHALATION_SOLUTION | Freq: Four times a day (QID) | RESPIRATORY_TRACT | Status: DC | PRN
  Administered 2016-04-17: 2.5 mg via RESPIRATORY_TRACT
  Filled 2016-04-15: qty 3

## 2016-04-15 MED ORDER — LOSARTAN POTASSIUM 50 MG PO TABS
25.0000 mg | ORAL_TABLET | Freq: Every day | ORAL | Status: DC
  Administered 2016-04-16 – 2016-04-18 (×3): 25 mg via ORAL
  Filled 2016-04-15 (×3): qty 1

## 2016-04-15 NOTE — Progress Notes (Signed)
Pt. arrived to floor from ED. Alert and oriented x 4. No respiratory distress noted.

## 2016-04-15 NOTE — ED Notes (Signed)
assisted pt to restroom, unable to void at this time.

## 2016-04-15 NOTE — Progress Notes (Signed)
Pre visit review using our clinic review tool, if applicable. No additional management support is needed unless otherwise documented below in the visit note. 

## 2016-04-15 NOTE — Patient Instructions (Addendum)
Please complete lab work at the lab at [facility]. Complete chest x ray at [facility].  We will contact you with your results.

## 2016-04-15 NOTE — Progress Notes (Signed)
Subjective:    Patient ID: [name], female    DOB: [birth year], 75 y.o.   MRN: [id number]  HPI  Pt is a 75 yr old female who presents today with chief complaint of cough. Cough has been present x 1 month.   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  Denies hx of fever or sore throat. She was treated with cefdinir on 12/4. Felt better x 1 day, then "came right back." She reports some congestion in the back of her throat. She reports + nasal drainage. Denies sinus pain pressure.  Denies hx of allergies.    Review of Systems See HPI  Past Medical History:  Diagnosis Date  . Abdominal aortic aneurysm (HCC)    REPAIRED IN 1993 BY [name]  AND HAS RECENTLY BEEN FOLLOWED BY [name]  . 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 Adenomatous colon polyp    tubular  . Anemia   . CAD in native artery 1996, 2002, 2005    Status post CABG x1 with LIMA-LAD for ostial LAD 90% stenosis --> down to 50% in 2002 and 30% in 2005.;  Atretic LIMA; Myoview 06/2010: Fixed anteroseptal, apical and inferoapical defect with moderate size. Most likely scar. Mild subendocardial ischemia. EF 71% LOW RISK.   [name] Cervical disc disease   . Chronic kidney disease   . COPD (chronic obstructive pulmonary disease) ([place])   . Diverticulosis   . Hyperlipidemia   . Hypertension   . Hypothyroidism (acquired)    hypo  . Myocardial infarction Aug. 2016   TIA  . Polymyalgia rheumatica (HCC)    2011 Dr. Charlestine Night  . Rheumatoid arthritis ([place]) 2011   Dr.Truslow; fracture knees, hands and wrists -   . S/P CABG x 1 1996   CABG--LIMA-LAD for ostial LAD (not felt to be PCI amenable). EF NORMAL then; LIMA now atretic  . Shortness of breath dyspnea    with exertion  . Stroke [facility]) 11-2014   TIA   . Urinary frequency      Social History   Social History  . Marital status:  Married    Spouse name: N/A  . Number of children: 4  . Years of education: N/A   Occupational History  . retired Retired   Social History Main Topics  . Smoking status: Former Smoker    Quit date: 12/04/1958  . Smokeless tobacco: Never Used  . Alcohol use No  . Drug use: No  . Sexual activity: Not Currently   Other Topics Concern  . Not on file   Social History Narrative   Regular [facility]- [facility]  [place] ; [place]. GOES TO [place] 3 DAYS A WEEK FOR ABOUT 45 MIN. PER SESSION      Living at [facility] since dec 2015    Past Surgical History:  Procedure Laterality Date  . 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 BUNIONECTOMY  07/2011   right foot  . CARDIAC CATHETERIZATION  2005   (Most recent CATH) - ostial LAD lesion 20-30% (down from 90% initially). Atretic LIMA. Minimal disease the RCA and Circumflex system.  [name] CARPAL TUNNEL RELEASE Left   . CATARACT EXTRACTION Bilateral   . CERVICAL SPINE SURGERY  plate 2008 Dr. Saintclair Halsted  . [place]   INCLUDED AN INTERNAL MAMMARY ARTERY TO THE LAD. EF WAS NORMAL  . INGUINAL HERNIA REPAIR Right   . NM MYOVIEW LTD  March 2012   Fixed anteroseptal, apical and inferoapical defect with moderate size. Most likely scar. Mild subendocardial ischemia. EF 71% LOW RISK.   [name] SPLENECTOMY    . TRANSTHORACIC ECHOCARDIOGRAM  12/2014   [facility]: Normal LV size & function. EF 55-60%,   . vagina polyp    . VIDEO ASSISTED THORACOSCOPY (VATS)/WEDGE RESECTION Left 03/02/2015   Procedure: VIDEO ASSISTED THORACOSCOPY (VATS), MINI THORACOTOMY, LEFT UPPER LOBE WEDGE, TAKE DOWN OF INTERNAL MAMMARY LESIONS, PLACEMENT OF ON-Q PUMP;  Surgeon: Grace Isaac, MD;  Location: [facility];  Service: Thoracic;  Laterality: Left;  [name] VIDEO BRONCHOSCOPY N/A 03/02/2015   Procedure: BRONCHOSCOPY;   Surgeon: Grace Isaac, MD;  Location: [facility];  Service: Thoracic;  Laterality: N/A;    Family History  Problem Relation Age of Onset  . Hypertension Mother   . Diabetes Mother   . Heart disease Mother   . Hyperlipidemia Mother   . Stroke Father   . Hyperlipidemia Sister   . Hypertension Sister   . Hypertension Daughter   . Cancer Paternal Uncle     Deceased from cancer not sure of site  . Hypertension Son   . Hyperlipidemia Son   . Hyperlipidemia Son   . Hypertension Son   . Hyperlipidemia Son   . Hypertension Son   . Hypertension    . Coronary artery disease    . Asthma Neg Hx   . Colon cancer Neg Hx     Allergies  Allergen Reactions  . Aspirin Other (See Comments)    Hurts stomach  . Codeine Nausea And Vomiting  . Nitroglycerin Other (See Comments)    Heart rate drops    Current Outpatient Prescriptions on File Prior to Visit  Medication Sig Dispense Refill  . acetaminophen (TYLENOL) 500 MG tablet Take 500 mg by mouth every 6 (six) hours as needed for mild pain.    [name] atorvastatin (LIPITOR) 80 MG tablet Take 1 tablet by mouth  daily 90 tablet 3  . BIOTIN PO Take 1 each by mouth daily.    . Cholecalciferol (VITAMIN D3) 2000 units capsule Take 1 capsule (2,000 Units total) by mouth daily. 100 capsule 3  . clopidogrel (PLAVIX) 75 MG tablet Take 1 tablet by mouth  daily 90 tablet 3  . leflunomide (ARAVA) 20 MG tablet Take 1 tablet (20 mg total) by mouth every other day. 15 tablet 6  . levothyroxine (SYNTHROID, LEVOTHROID) 88 MCG tablet Take 1 tablet by mouth  daily 90 tablet 3  . loperamide (IMODIUM A-D) 2 MG tablet Take 2 mg by mouth 4 (four) times daily as needed for diarrhea or loose stools.    [name] losartan (COZAAR) 25 MG tablet Take 1 tablet by mouth  daily 90 tablet 3  . metoprolol succinate (TOPROL-XL) 25 MG 24 hr tablet Take 1 tablet by mouth  daily 90 tablet 3  . Probiotic Product (ALIGN) 4 MG CAPS Take 1 capsule by mouth daily. 30 capsule 1  . psyllium  (METAMUCIL) 58.6 % packet Take 1 packet by mouth daily.    . traMADol (ULTRAM) 50 MG tablet Take 1-2 tablets (50-100 mg total) by mouth every 6 (six) hours as needed (mild pain). 30 tablet 0   Current Facility-Administered Medications on File Prior  to Visit  Medication Dose Route Frequency Provider Last Rate Last Dose  . 0.9 %  sodium chloride infusion  500 mL Intravenous Continuous Manus Gunning, MD        BP 102/80 (BP Location: Left Arm, Patient Position: Sitting, Cuff Size: Normal)   Pulse (!) 112   Temp 98.5 F (36.9 C) (Oral)   Resp 18   Ht 5\' 3"  (1.6 m)   Wt 152 lb (68.9 kg)   SpO2 90%   BMI 26.93 kg/m       Objective:   Physical Exam  Constitutional: She is oriented to person, place, and time. She appears well-developed and well-nourished.  HENT:  Head: Normocephalic and atraumatic.  Right Ear: Tympanic membrane and ear canal normal.  Left Ear: Tympanic membrane and ear canal normal.  Mouth/Throat: No oropharyngeal exudate, posterior oropharyngeal edema or posterior oropharyngeal erythema.  Cardiovascular: Normal rate, regular rhythm and normal heart sounds.   No murmur heard. Pulmonary/Chest: Effort normal and breath sounds normal. No respiratory distress. She has no wheezes. She has no rales.  Coarse cough  Musculoskeletal: She exhibits no edema.  Neurological: She is alert and oriented to person, place, and time.  Skin: Skin is warm and dry.  Psychiatric: She has a normal mood and affect. Her behavior is normal. Judgment and thought content normal.          Assessment & Plan:  Cough- will repeat cxr to rule out pneumonia.  Plan abx if pneumonia.  Rx tessalon prn cough and albuterol prn if she develops SOB.  I am a bit concerned about her tachycardia and reduced oxygen saturation.  Will obtain stat d dimer.  If + will need to refer to ED for further work up.  I discussed this with the patient.

## 2016-04-15 NOTE — Telephone Encounter (Signed)
Please contact pt and let her know that her d dimer is positive. Indicates possibility of pulmonary embolus. I would recommend that she go to the [facility] ED for further evaluation.

## 2016-04-15 NOTE — Telephone Encounter (Signed)
Called and spoke with pt and pt is aware. Pt states she will go to the ER now.

## 2016-04-15 NOTE — H&P (Signed)
History and Physical    [name] HCW:[id number] DOB: May 14, 1940 DOA: 04/15/2016  PCP: [name], MD Patient coming from: Home  Chief Complaint: Coughing  HPI: [name] is a 75 y.o. female with medical history significant of COPD, CAD s/p CABG x1, PVD, CVA, AAA s/p repair. Symptoms started about a month ago with a nonproductive cough. Symptoms remain persistent and she went to primary care physician about 2 weeks ago. She is prescribed antibiotics at that time which helped for about 1 day, but symptoms continue to persist. She eventually came to the emergency department because her symptoms continued to worsen. She reports no aggravating symptoms. He has mild dyspnea at rest.  ED Course: Vitals: Afebrile. Slight tachycardia to 100s. Slightly hypertensive. On 2L O2 via [place] Labs: creatinine 1.31.  Imaging: CT shows abnormal enlarging soft tissue in left upper lobe Medications/Course: Given albuterol, atrovent and solu-medrol.  Review of Systems: Review of Systems  Constitutional: Negative for chills and fever.  Respiratory: Positive for cough and shortness of breath. Negative for sputum production.   Gastrointestinal: Negative for abdominal pain, diarrhea, nausea and vomiting.  All other systems reviewed and are negative.   Past Medical History:  Diagnosis Date  . Abdominal aortic aneurysm (HCC)    REPAIRED IN 1993 BY [name]  AND HAS RECENTLY BEEN FOLLOWED BY [name]  . 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 Adenomatous colon polyp    tubular  . Anemia   . CAD in native artery 1996, 2002, 2005    Status post CABG x1 with LIMA-LAD for ostial LAD 90% stenosis --> down to 50% in 2002 and 30% in 2005.;  Atretic LIMA; Myoview 06/2010: Fixed anteroseptal, apical and inferoapical defect with moderate size. Most likely scar. Mild subendocardial ischemia. EF 71% LOW RISK.   [name] Cervical disc disease     . Chronic kidney disease   . COPD (chronic obstructive pulmonary disease) ([place])   . Diverticulosis   . Hyperlipidemia   . Hypertension   . Hypothyroidism (acquired)    hypo  . Myocardial infarction Aug. 2016   TIA  . Polymyalgia rheumatica (HCC)    2011 Dr. Charlestine Night  . Rheumatoid arthritis ([place]) 2011   Dr.Truslow; fracture knees, hands and wrists -   . S/P CABG x 1 1996   CABG--LIMA-LAD for ostial LAD (not felt to be PCI amenable). EF NORMAL then; LIMA now atretic  . Shortness of breath dyspnea    with exertion  . Stroke [facility]) 11-2014   TIA   . Urinary frequency     Past Surgical History:  Procedure Laterality Date  . 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 BUNIONECTOMY  07/2011   right foot  . CARDIAC CATHETERIZATION  2005   (Most recent CATH) - ostial LAD lesion 20-30% (down from 90% initially). Atretic LIMA. Minimal disease the RCA and Circumflex system.  [name] CARPAL TUNNEL RELEASE Left   . CATARACT EXTRACTION Bilateral   . CERVICAL SPINE SURGERY     plate 2008 Dr. Saintclair Halsted  . [place]   INCLUDED AN INTERNAL MAMMARY ARTERY TO THE LAD. EF WAS NORMAL  . INGUINAL HERNIA REPAIR Right   . NM MYOVIEW LTD  March 2012   Fixed anteroseptal, apical and inferoapical defect with moderate size. Most likely scar.  Mild subendocardial ischemia. EF 71% LOW RISK.   [name] SPLENECTOMY    . TRANSTHORACIC ECHOCARDIOGRAM  12/2014   [facility]: Normal LV size & function. EF 55-60%,   . vagina polyp    . VIDEO ASSISTED THORACOSCOPY (VATS)/WEDGE RESECTION Left 03/02/2015   Procedure: VIDEO ASSISTED THORACOSCOPY (VATS), MINI THORACOTOMY, LEFT UPPER LOBE WEDGE, TAKE DOWN OF INTERNAL MAMMARY LESIONS, PLACEMENT OF ON-Q PUMP;  Surgeon: Grace Isaac, MD;  Location: [place];  Service: Thoracic;  Laterality: Left;  [name] VIDEO BRONCHOSCOPY N/A 03/02/2015   Procedure: BRONCHOSCOPY;   Surgeon: Grace Isaac, MD;  Location: [place];  Service: Thoracic;  Laterality: N/A;     reports that she quit smoking about 57 years ago. She has never used smokeless tobacco. She reports that she does not drink alcohol or use drugs.  Allergies  Allergen Reactions  . Aspirin Other (See Comments)    Hurts stomach  . Codeine Nausea And Vomiting  . Nitroglycerin Other (See Comments)    Heart rate drops    Family History  Problem Relation Age of Onset  . Hypertension Mother   . Diabetes Mother   . Heart disease Mother   . Hyperlipidemia Mother   . Stroke Father   . Hyperlipidemia Sister   . Hypertension Sister   . Hypertension Daughter   . Cancer Paternal Uncle     Deceased from cancer not sure of site  . Hypertension Son   . Hyperlipidemia Son   . Hyperlipidemia Son   . Hypertension Son   . Hyperlipidemia Son   . Hypertension Son   . Hypertension    . Coronary artery disease    . Asthma Neg Hx   . Colon cancer Neg Hx     Prior to Admission medications   Medication Sig Start Date End Date Taking? Authorizing Provider  acetaminophen (TYLENOL) 500 MG tablet Take 500 mg by mouth every 6 (six) hours as needed for mild pain.   Yes Historical Provider, MD  albuterol (PROVENTIL HFA;VENTOLIN HFA) 108 (90 Base) MCG/ACT inhaler Inhale 2 puffs into the lungs every 6 (six) hours as needed for wheezing or shortness of breath. 04/15/16  Yes Debbrah Alar, NP  atorvastatin (LIPITOR) 80 MG tablet Take 1 tablet by mouth  daily 01/09/16  Yes Cassandria Anger, MD  benzonatate (TESSALON) 100 MG capsule Take 1 capsule (100 mg total) by mouth 3 (three) times daily as needed. 04/15/16  Yes Debbrah Alar, NP  BIOTIN PO Take 1 each by mouth daily.   Yes Historical Provider, MD  cetirizine (ZYRTEC) 10 MG tablet Take 10 mg by mouth daily.   Yes Historical Provider, MD  clopidogrel (PLAVIX) 75 MG tablet Take 1 tablet by mouth  daily 06/28/15  Yes Cassandria Anger, MD  leflunomide  (ARAVA) 20 MG tablet Take 1 tablet (20 mg total) by mouth every other day. 02/26/14  Yes Cassandria Anger, MD  levothyroxine (SYNTHROID, LEVOTHROID) 88 MCG tablet Take 1 tablet by mouth  daily 06/28/15  Yes [name] Plotnikov, MD  loperamide (IMODIUM A-D) 2 MG tablet Take 2 mg by mouth 4 (four) times daily as needed for diarrhea or loose stools.   Yes Historical Provider, MD  losartan (COZAAR) 25 MG tablet Take 1 tablet by mouth  daily 06/28/15  Yes [name] Plotnikov, MD  metoprolol succinate (TOPROL-XL) 25 MG 24 hr tablet Take 1 tablet by mouth  daily 06/28/15  Yes Cassandria Anger, MD  Probiotic Product (ALIGN) 4  MG CAPS Take 1 capsule by mouth daily. 11/26/14  Yes [name] Plotnikov, MD  psyllium (METAMUCIL) 58.6 % packet Take 1 packet by mouth daily.   Yes Historical Provider, MD    Physical Exam: Vitals:   04/15/16 1330 04/15/16 1433 04/15/16 1600 04/15/16 1624  BP: 143/66 181/71 158/70   Pulse: 103 99 93   Resp: (!) 45 24 (!) 28   Temp:      TempSrc:      SpO2: 93% 94% 95% 100%  Weight:      Height:         Constitutional: NAD, calm, comfortable Eyes: PERRL, lids and conjunctivae normal ENMT: Mucous membranes are moist. Posterior pharynx clear of any exudate or lesions. Neck: normal, supple, no masses, no thyromegaly Respiratory: clear to auscultation bilaterally, no wheezing, no crackles. Normal respiratory effort. No accessory muscle use.  Cardiovascular: Regular rate and rhythm, no murmurs / rubs / gallops. No extremity edema. 2+ pedal pulses. Abdomen: no tenderness, no masses palpated.Bowel sounds positive.  Musculoskeletal: no clubbing / cyanosis. No joint deformity upper and lower extremities. Good ROM, no contractures. Normal muscle tone.  Skin: no rashes, lesions, ulcers. No induration Neurologic: CN 2-12 grossly intact. Sensation intact, DTR normal. Strength 5/5 in all 4.  Psychiatric: Normal judgment and insight. Alert and oriented x 3. Normal mood.   Labs on  Admission: I have personally reviewed following labs and imaging studies  CBC:  Recent Labs Lab 04/15/16 1200  WBC 8.5  NEUTROABS 5.6  HGB 13.0  HCT 38.1  MCV 90.9  PLT 503*   Basic Metabolic Panel:  Recent Labs Lab 04/15/16 1300  NA 137  K 4.1  CL 100*  CO2 24  GLUCOSE 102*  BUN 20  CREATININE 1.31*  CALCIUM 9.5   GFR: Estimated Creatinine Clearance: 33.3 mL/min (by C-G formula based on SCr of 1.31 mg/dL (H)). Liver Function Tests: No results for input(s): AST, ALT, ALKPHOS, BILITOT, PROT, ALBUMIN in the last 168 hours. No results for input(s): LIPASE, AMYLASE in the last 168 hours. No results for input(s): AMMONIA in the last 168 hours. Coagulation Profile:  Recent Labs Lab 04/15/16 1200  INR 0.96   Cardiac Enzymes: No results for input(s): CKTOTAL, CKMB, CKMBINDEX, TROPONINI in the last 168 hours. BNP (last 3 results) No results for input(s): PROBNP in the last 8760 hours. HbA1C: No results for input(s): HGBA1C in the last 72 hours. CBG: No results for input(s): GLUCAP in the last 168 hours. Lipid Profile: No results for input(s): CHOL, HDL, LDLCALC, TRIG, CHOLHDL, LDLDIRECT in the last 72 hours. Thyroid Function Tests: No results for input(s): TSH, T4TOTAL, FREET4, T3FREE, THYROIDAB in the last 72 hours. Anemia Panel: No results for input(s): VITAMINB12, FOLATE, FERRITIN, TIBC, IRON, RETICCTPCT in the last 72 hours. Urine analysis:    Component Value Date/Time   COLORURINE YELLOW 12/23/2015 0839   APPEARANCEUR CLEAR 12/23/2015 0839   LABSPEC <=1.005 (A) 12/23/2015 0839   PHURINE 6.5 12/23/2015 0839   GLUCOSEU NEGATIVE 12/23/2015 0839   HGBUR NEGATIVE 12/23/2015 0839   BILIRUBINUR NEGATIVE 12/23/2015 0839   KETONESUR NEGATIVE 12/23/2015 0839   PROTEINUR NEGATIVE 02/26/2015 1311   UROBILINOGEN 0.2 12/23/2015 0839   NITRITE NEGATIVE 12/23/2015 0839   LEUKOCYTESUR NEGATIVE 12/23/2015 0839   Sepsis Labs:  !!!!!!!!!!!!!!!!!!!!!!!!!!!!!!!!!!!!!!!!!!!! @LABRCNTIP (procalcitonin:4,lacticidven:4) )No results found for this or any previous visit (from the past 240 hour(s)).   Radiological Exams on Admission: Dg Chest 2 View  Result Date: 04/15/2016 CLINICAL DATA:  Cough for 1  month EXAM: CHEST  2 VIEW COMPARISON:  04/03/2016 FINDINGS: There is hyperinflation of the lungs compatible with COPD. Prior CABG. Heart is normal size. No focal airspace opacities or effusions. No acute bony abnormality. IMPRESSION: COPD.  No active disease. Electronically Signed   By: Rolm Baptise M.D.   On: 04/15/2016 10:13   Ct Angio Chest Pe W And/or Wo Contrast  Result Date: 04/15/2016 CLINICAL DATA:  Elevated D-dimer.  Productive cough congestion. EXAM: CT ANGIOGRAPHY CHEST WITH CONTRAST TECHNIQUE: Multidetector CT imaging of the chest was performed using the standard protocol during bolus administration of intravenous contrast. Multiplanar CT image reconstructions and MIPs were obtained to evaluate the vascular anatomy. CONTRAST:  80 cc Isovue 370 IV COMPARISON:  11/23/2015 FINDINGS: Cardiovascular: No filling defects in the pulmonary arteries to suggest pulmonary emboli. Heart is normal size. Prior CABG. Aorta is tortuous with scattered calcifications, non aneurysmal. Mediastinum/Nodes: Mildly enlarged mediastinal lymph nodes. Subcarinal lymph node has a short axis diameter of 13 mm. Other small scattered paratracheal and AP window lymph nodes. No hilar or axillary adenopathy. Lungs/Pleura: Increasing density noted in the medial left upper lobe in the area of prior postoperative change. This soft tissue measures 2.2 x 1.7 cm on image 15. Given the location in the postoperative bed, this is concerning for tumor recurrence. Nodule in the left lower lobe on image 38 measures 8 mm on image 38, stable. Scarring noted in the lung bases. No pleural effusions. Upper Abdomen: Imaging into the upper abdomen shows no acute findings.  Musculoskeletal: Chest wall soft tissues are unremarkable. No acute bony abnormality or focal bone lesion. Review of the MIP images confirms the above findings. IMPRESSION: Enlarging abnormal soft tissue in the left upper lobe at the prior surgical site concerning for recurrent tumor. This measures up to 2.2 cm. This could be further evaluated with PET CT. Stable left lower lobe pulmonary nodule. Borderline and mildly enlarged mediastinal lymph nodes. Electronically Signed   By: Rolm Baptise M.D.   On: 04/15/2016 15:26    EKG: Independently reviewed. Sinus tachycardia. T-wave inversion in V2 unchanged from previous  Assessment/Plan Principal Problem:   Acute respiratory failure with hypoxia (HCC) Active Problems:   Essential hypertension   Chronic kidney disease   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)   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; now atretic and significantly improved LAD lesion without intervention   Acute respiratory failure with hypoxia Initially treated for COPD exacerbation, however, patient without significant evidence for COPD. No pneumonia seen on CT scan. Possibly viral. -wean oxygen as able -Albuterol prn -if wheezing in AM, would treat as COPD exacerbation and continue steroid therapy   Recurrent lung tumor Patient is s/p VATS with resection of left upper lobe in 2016. CT today shows recurrence. Patient made aware and states she is supposed to follow-up with her surgeon anyway. Possibly contributing. -outpatient follow-up with Dr. Servando Snare  CAD s/p CABG -continue lipitor  CKD stage III Stable  CVA -continue Plavix  Adrenal mass -stable  Hypothyroidism -continue levothyroxine  Hypertension -continue metoprolol -continue losartan   DVT prophylaxis: Lovenox Code Status: Full code Family Communication: Daughter at bedside Disposition Plan: Discharge home in 2-3 days Consults called: None Admission status:  Inpatient, medical floor   Cordelia Poche, MD [facility] Pager [phone]  If 7PM-7AM, please contact night-coverage [email or web address] Password [email or web address]  04/15/2016, 5:25 PM

## 2016-04-15 NOTE — ED Provider Notes (Signed)
[facility] DEPT Provider Note   CSN: [id number] Arrival date & time: 04/15/16  1149     History   Chief Complaint Chief Complaint  Patient presents with  . Abnormal Lab    HPI [name] is a 75 y.o. female.  Patients with history of AAA repair, CABG, benign pulmonary nodules status post surgery, stroke currently on Plavix, chronic kidney disease -- presents with one-month history of cough. She has been seen by her PCP for this. She has been treated with one course of antibiotics. Symptoms are not really changed. She continues to have cough, fatigue, shortness of breath. She states that she spends most of the day in bed. Her physician ordered a d-dimer today which returned elevated. She is noted to have tachycardia and borderline oxygen sat. Patient denies lower extremity swelling or pain. She denies chest pains or pain with deep breathing. Patient denies risk factors for pulmonary embolism including: unilateral leg swelling, history of DVT/PE/other blood clots, use of exogenous hormones, recent immobilizations, recent surgery, recent travel (>4hr segment), malignancy, hemoptysis.        Past Medical History:  Diagnosis Date  . Abdominal aortic aneurysm (HCC)    REPAIRED IN 1993 BY [name]  AND HAS RECENTLY BEEN FOLLOWED BY [name]  . 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 Adenomatous colon polyp    tubular  . Anemia   . CAD in native artery 1996, 2002, 2005    Status post CABG x1 with LIMA-LAD for ostial LAD 90% stenosis --> down to 50% in 2002 and 30% in 2005.;  Atretic LIMA; Myoview 06/2010: Fixed anteroseptal, apical and inferoapical defect with moderate size. Most likely scar. Mild subendocardial ischemia. EF 71% LOW RISK.   [name] Cervical disc disease   . Chronic kidney disease   . COPD (chronic obstructive pulmonary disease) ([place])   . Diverticulosis   . Hyperlipidemia   . Hypertension     . Hypothyroidism (acquired)    hypo  . Myocardial infarction Aug. 2016   TIA  . Polymyalgia rheumatica (HCC)    2011 Dr. Charlestine Night  . Rheumatoid arthritis ([place]) 2011   Dr.Truslow; fracture knees, hands and wrists -   . S/P CABG x 1 1996   CABG--LIMA-LAD for ostial LAD (not felt to be PCI amenable). EF NORMAL then; LIMA now atretic  . Shortness of breath dyspnea    with exertion  . Stroke [facility]) 11-2014   TIA   . Urinary frequency     Patient Active Problem List   Diagnosis Date Noted  . Acute bronchitis 04/03/2016  . Wrist pain, right 12/14/2015  . Lung mass 03/02/2015  . Pre-operative cardiovascular examination 02/21/2015  . Dyspnea 02/16/2015  . Solitary pulmonary nodule 02/15/2015  . TIA (transient ischemic attack) 12/31/2014  . Diarrhea 11/26/2014  . Well adult exam 11/26/2014  . Seasonal allergies 12/31/2013  . Splenic artery aneurysm ([place]) 04/07/2013  . Hyperlipidemia with target LDL less than 70   . Pernicious anemia 09/04/2012  . 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 Long term (current) use of anticoagulants 11/08/2010  . Rheumatoid arthritis ([place]) 06/23/2010  . Pain in joint 11/26/2009  . 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 Hypothyroidism 12/08/2006  . MACULAR DEGENERATION 12/08/2006  . Essential hypertension 12/08/2006  . Diaphragmatic hernia 12/08/2006  . RENAL FAILURE, CHRONIC 12/08/2006  . ASPLENIA 12/08/2006  . Atherosclerosis of coronary artery without angina pectoris 01/01/1995  .  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) 12/22/1994    Class: History of  . 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; now atretic and significantly improved LAD lesion without intervention 12/22/1994    Class: History of    Past Surgical History:  Procedure Laterality Date  . ABDOMINAL AORTIC ANEURYSM REPAIR  9833   Complicated by mesenteric artery stenosis and splenic artery infarction with acquired Asplenia  . BUNIONECTOMY  07/2011   right  foot  . CARDIAC CATHETERIZATION  2005   (Most recent CATH) - ostial LAD lesion 20-30% (down from 90% initially). Atretic LIMA. Minimal disease the RCA and Circumflex system.  [name] CARPAL TUNNEL RELEASE Left   . CATARACT EXTRACTION Bilateral   . CERVICAL SPINE SURGERY     plate 2008 Dr. Saintclair Halsted  . [place]   INCLUDED AN INTERNAL MAMMARY ARTERY TO THE LAD. EF WAS NORMAL  . INGUINAL HERNIA REPAIR Right   . NM MYOVIEW LTD  March 2012   Fixed anteroseptal, apical and inferoapical defect with moderate size. Most likely scar. Mild subendocardial ischemia. EF 71% LOW RISK.   [name] SPLENECTOMY    . TRANSTHORACIC ECHOCARDIOGRAM  12/2014   [facility]: Normal LV size & function. EF 55-60%,   . vagina polyp    . VIDEO ASSISTED THORACOSCOPY (VATS)/WEDGE RESECTION Left 03/02/2015   Procedure: VIDEO ASSISTED THORACOSCOPY (VATS), MINI THORACOTOMY, LEFT UPPER LOBE WEDGE, TAKE DOWN OF INTERNAL MAMMARY LESIONS, PLACEMENT OF ON-Q PUMP;  Surgeon: Grace Isaac, MD;  Location: [place] [facility];  Service: Thoracic;  Laterality: Left;  [name] VIDEO BRONCHOSCOPY N/A 03/02/2015   Procedure: BRONCHOSCOPY;  Surgeon: Grace Isaac, MD;  Location: [facility] OR;  Service: Thoracic;  Laterality: N/A;    OB History    No data available       Home Medications    Prior to Admission medications   Medication Sig Start Date End Date Taking? Authorizing Provider  acetaminophen (TYLENOL) 500 MG tablet Take 500 mg by mouth every 6 (six) hours as needed for mild pain.    Historical Provider, MD  albuterol (PROVENTIL HFA;VENTOLIN HFA) 108 (90 Base) MCG/ACT inhaler Inhale 2 puffs into the lungs every 6 (six) hours as needed for wheezing or shortness of breath. 04/15/16   Debbrah Alar, NP  atorvastatin (LIPITOR) 80 MG tablet Take 1 tablet by mouth  daily 01/09/16   Cassandria Anger, MD  benzonatate (TESSALON) 100 MG capsule Take 1 capsule (100 mg total) by mouth 3 (three) times daily as needed. 04/15/16    Debbrah Alar, NP  BIOTIN PO Take 1 each by mouth daily.    Historical Provider, MD  Cholecalciferol (VITAMIN D3) 2000 units capsule Take 1 capsule (2,000 Units total) by mouth daily. 12/23/15   Cassandria Anger, MD  clopidogrel (PLAVIX) 75 MG tablet Take 1 tablet by mouth  daily 06/28/15   Cassandria Anger, MD  leflunomide (ARAVA) 20 MG tablet Take 1 tablet (20 mg total) by mouth every other day. 02/26/14   Cassandria Anger, MD  levothyroxine (SYNTHROID, LEVOTHROID) 88 MCG tablet Take 1 tablet by mouth  daily 06/28/15   Cassandria Anger, MD  loperamide (IMODIUM A-D) 2 MG tablet Take 2 mg by mouth 4 (four) times daily as needed for diarrhea or loose stools.    Historical Provider, MD  losartan (COZAAR) 25 MG tablet Take 1 tablet by mouth  daily 06/28/15   Cassandria Anger, MD  metoprolol succinate (TOPROL-XL) 25 MG 24 hr tablet Take 1 tablet by mouth  daily 06/28/15   [name] Plotnikov, MD  Probiotic Product (ALIGN) 4 MG CAPS Take 1 capsule by mouth daily. 11/26/14   [name] Plotnikov, MD  psyllium (METAMUCIL) 58.6 % packet Take 1 packet by mouth daily.    Historical Provider, MD  traMADol (ULTRAM) 50 MG tablet Take 1-2 tablets (50-100 mg total) by mouth every 6 (six) hours as needed (mild pain). 03/22/15   Coolidge Breeze, PA-C    Family History Family History  Problem Relation Age of Onset  . Hypertension Mother   . Diabetes Mother   . Heart disease Mother   . Hyperlipidemia Mother   . Stroke Father   . Hyperlipidemia Sister   . Hypertension Sister   . Hypertension Daughter   . Cancer Paternal Uncle     Deceased from cancer not sure of site  . Hypertension Son   . Hyperlipidemia Son   . Hyperlipidemia Son   . Hypertension Son   . Hyperlipidemia Son   . Hypertension Son   . Hypertension    . Coronary artery disease    . Asthma Neg Hx   . Colon cancer Neg Hx     Social History Social History  Substance Use Topics  . Smoking status: Former Smoker    Quit  date: 12/04/1958  . Smokeless tobacco: Never Used  . Alcohol use No     Allergies   Aspirin; Codeine; and Nitroglycerin   Review of Systems Review of Systems  Constitutional: Positive for fatigue. Negative for fever.  HENT: Negative for rhinorrhea and sore throat.   Eyes: Negative for redness.  Respiratory: Positive for cough and shortness of breath.   Cardiovascular: Negative for chest pain.  Gastrointestinal: Negative for abdominal pain, diarrhea, nausea and vomiting.  Genitourinary: Negative for dysuria.  Musculoskeletal: Negative for myalgias.  Skin: Negative for rash.  Neurological: Negative for light-headedness and headaches.     Physical Exam Updated Vital Signs BP 140/78 (BP Location: Left Arm)   Pulse (!) 123   Temp 98.1 F (36.7 C) (Oral)   Resp 20   Ht 5\' 3"  (1.6 m)   Wt 68 kg   SpO2 94%   BMI 26.57 kg/m   Physical Exam  Constitutional: She appears well-developed and well-nourished.  HENT:  Head: Normocephalic and atraumatic.  Mouth/Throat: Oropharynx is clear and moist.  Eyes: Conjunctivae are normal. Right eye exhibits no discharge. Left eye exhibits no discharge.  Neck: Normal range of motion. Neck supple.  Cardiovascular: Regular rhythm and normal heart sounds.  Tachycardia present.   No murmur heard. Pulmonary/Chest: Effort normal. No respiratory distress. She has wheezes (scattered). She has no rales.  Abdominal: Soft. There is no tenderness.  Musculoskeletal: She exhibits no edema.  No clinical signs of DVT in lower extremities.  Neurological: She is alert.  Skin: Skin is warm and dry.  Psychiatric: She has a normal mood and affect.  Nursing note and vitals reviewed.    ED Treatments / Results  Labs (all labs ordered are listed, but only abnormal results are displayed) Labs Reviewed  CBC WITH DIFFERENTIAL/PLATELET - Abnormal; Notable for the following:       Result Value   RDW 15.6 (*)    Platelets 642 (*)    Monocytes Absolute 1.2 (*)     Basophils Absolute 0.2 (*)    All other components within normal limits  BASIC METABOLIC PANEL - Abnormal; Notable  for the following:    Chloride 100 (*)    Glucose, Bld 102 (*)    Creatinine, Ser 1.31 (*)    GFR calc non Af Amer 38 (*)    GFR calc Af Amer 44 (*)    All other components within normal limits  I-STAT CG4 LACTIC ACID, ED - Abnormal; Notable for the following:    Lactic Acid, Venous 2.23 (*)    All other components within normal limits  PROTIME-INR  URINALYSIS, ROUTINE W REFLEX MICROSCOPIC  I-STAT TROPOININ, ED    EKG  EKG Interpretation  Date/Time:  Saturday April 15 2016 12:10:33 EST Ventricular Rate:  111 PR Interval:    QRS Duration: 81 QT Interval:  304 QTC Calculation: 413 R Axis:   95 Text Interpretation:  Sinus tachycardia Right axis deviation Nonspecific T abnrm, anterolateral leads since last tracing no significant change Confirmed by BELFI  MD, MELANIE ([id number]) on 04/15/2016 12:14:45 PM       Radiology Dg Chest 2 View  Result Date: 04/15/2016 CLINICAL DATA:  Cough for 1 month EXAM: CHEST  2 VIEW COMPARISON:  04/03/2016 FINDINGS: There is hyperinflation of the lungs compatible with COPD. Prior CABG. Heart is normal size. No focal airspace opacities or effusions. No acute bony abnormality. IMPRESSION: COPD.  No active disease. Electronically Signed   By: Rolm Baptise M.D.   On: 04/15/2016 10:13    Procedures Procedures (including critical care time)  Medications Ordered in ED Medications  sodium chloride 0.9 % injection (not administered)  iopamidol (ISOVUE-370) 76 % injection (not administered)  albuterol (PROVENTIL) (2.5 MG/3ML) 0.083% nebulizer solution 5 mg (5 mg Nebulization Given 04/15/16 1253)  ipratropium (ATROVENT) nebulizer solution 0.5 mg (0.5 mg Nebulization Given 04/15/16 1253)  sodium chloride 0.9 % bolus 500 mL (0 mLs Intravenous Stopped 04/15/16 1452)  iopamidol (ISOVUE-370) 76 % injection 100 mL (80 mLs Intravenous Contrast  Given 04/15/16 1501)  methylPREDNISolone sodium succinate (SOLU-MEDROL) 125 mg/2 mL injection 125 mg (125 mg Intravenous Given 04/15/16 1544)  albuterol (PROVENTIL) (2.5 MG/3ML) 0.083% nebulizer solution 5 mg (5 mg Nebulization Given 04/15/16 1623)  ipratropium (ATROVENT) nebulizer solution 0.5 mg (0.5 mg Nebulization Given 04/15/16 1623)     Initial Impression / Assessment and Plan / ED Course  I have reviewed the triage vital signs and the nursing notes.  Pertinent labs & imaging results that were available during my care of the patient were reviewed by me and considered in my medical decision making (see chart for details).  Clinical Course    Patient seen and examined. Work-up initiated. Medications ordered.   Vital signs reviewed and are as follows: BP 140/78 (BP Location: Left Arm)   Pulse (!) 123   Temp 98.1 F (36.7 C) (Oral)   Resp 20   Ht 5\' 3"  (1.6 m)   Wt 68 kg   SpO2 94%   BMI 26.57 kg/m   Patient with tachycardia, elevated lactate of 2.23.   The patient is noted to have a lactate>2. With the current information available to me, I don't think the patient is in septic shock. The lactate>2, is related to elevated HR, hypoxia.  3:53 PM CT returned without showing PE. Pt was placed on supplemental O2 2/2 saturations in the upper 80's on RA. Will request admit for new oxygen requirement and treat as COPD exacerbation.   Patient discussed with Dr. Tamera Punt who has seen earlier.   4:40 PM Spoke with Dr. Teryl Lucy who will see, place orders.   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 Cough x 1 month, not improved with outpatient treatment, wheezing, new O2 requirement. Admit.   New Prescriptions Current Discharge Medication List       Carlisle Cater, PA-C 04/15/16 [place],  MD 04/16/16 [phone]

## 2016-04-15 NOTE — ED Triage Notes (Addendum)
Pt told to be evaluated for possible pulmonary embolus related to elevated d dimer results. Pt reports congested and productive cough for a month. Pt denies CP but reports slight SOB with exertion.

## 2016-04-16 DIAGNOSIS — R911 Solitary pulmonary nodule: Secondary | ICD-10-CM

## 2016-04-16 DIAGNOSIS — J9601 Acute respiratory failure with hypoxia: Secondary | ICD-10-CM | POA: Diagnosis not present

## 2016-04-16 LAB — EXPECTORATED SPUTUM ASSESSMENT W REFEX TO RESP CULTURE

## 2016-04-16 LAB — RESPIRATORY PANEL BY PCR
ADENOVIRUS-RVPPCR: NOT DETECTED
BORDETELLA PERTUSSIS-RVPCR: NOT DETECTED
CHLAMYDOPHILA PNEUMONIAE-RVPPCR: NOT DETECTED
CORONAVIRUS HKU1-RVPPCR: NOT DETECTED
CORONAVIRUS NL63-RVPPCR: NOT DETECTED
Coronavirus 229E: NOT DETECTED
Coronavirus OC43: NOT DETECTED
INFLUENZA A-RVPPCR: NOT DETECTED
Influenza B: NOT DETECTED
MYCOPLASMA PNEUMONIAE-RVPPCR: NOT DETECTED
Metapneumovirus: DETECTED — AB
PARAINFLUENZA VIRUS 3-RVPPCR: NOT DETECTED
PARAINFLUENZA VIRUS 4-RVPPCR: NOT DETECTED
Parainfluenza Virus 1: NOT DETECTED
Parainfluenza Virus 2: NOT DETECTED
RHINOVIRUS / ENTEROVIRUS - RVPPCR: NOT DETECTED
Respiratory Syncytial Virus: NOT DETECTED

## 2016-04-16 LAB — CBC
HCT: 36.9 % (ref 36.0–46.0)
HEMOGLOBIN: 12.3 g/dL (ref 12.0–15.0)
MCH: 30.1 pg (ref 26.0–34.0)
MCHC: 33.3 g/dL (ref 30.0–36.0)
MCV: 90.4 fL (ref 78.0–100.0)
PLATELETS: 589 10*3/uL — AB (ref 150–400)
RBC: 4.08 MIL/uL (ref 3.87–5.11)
RDW: 15.5 % (ref 11.5–15.5)
WBC: 5.4 10*3/uL (ref 4.0–10.5)

## 2016-04-16 LAB — BASIC METABOLIC PANEL
ANION GAP: 9 (ref 5–15)
BUN: 21 mg/dL — ABNORMAL HIGH (ref 6–20)
CALCIUM: 9.5 mg/dL (ref 8.9–10.3)
CO2: 25 mmol/L (ref 22–32)
CREATININE: 1.19 mg/dL — AB (ref 0.44–1.00)
Chloride: 102 mmol/L (ref 101–111)
GFR calc non Af Amer: 43 mL/min — ABNORMAL LOW (ref >60)
GFR, EST AFRICAN AMERICAN: 50 mL/min — AB (ref >60)
Glucose, Bld: 156 mg/dL — ABNORMAL HIGH (ref 65–99)
Potassium: 4.5 mmol/L (ref 3.5–5.1)
SODIUM: 136 mmol/L (ref 135–145)

## 2016-04-16 LAB — MRSA PCR SCREENING: MRSA BY PCR: NEGATIVE

## 2016-04-16 MED ORDER — GUAIFENESIN ER 600 MG PO TB12
600.0000 mg | ORAL_TABLET | Freq: Two times a day (BID) | ORAL | Status: DC
  Administered 2016-04-16 – 2016-04-18 (×5): 600 mg via ORAL
  Filled 2016-04-16 (×5): qty 1

## 2016-04-16 MED ORDER — PANTOPRAZOLE SODIUM 20 MG PO TBEC: 20.0000 mg | DELAYED_RELEASE_TABLET | Freq: Every day | ORAL | Status: DC

## 2016-04-16 MED ORDER — FLUTICASONE PROPIONATE 50 MCG/ACT NA SUSP
2.0000 | Freq: Every day | NASAL | Status: DC
  Administered 2016-04-16 – 2016-04-18 (×3): 2 via NASAL
  Filled 2016-04-16: qty 16

## 2016-04-16 MED ORDER — FAMOTIDINE 20 MG PO TABS
10.0000 mg | ORAL_TABLET | Freq: Every day | ORAL | Status: DC
  Administered 2016-04-16 – 2016-04-17 (×2): 10 mg via ORAL
  Filled 2016-04-16 (×2): qty 1

## 2016-04-16 NOTE — Progress Notes (Signed)
PROGRESS NOTE  [name] WJX:[id number] DOB: 1940-11-02 DOA: 04/15/2016 PCP: [name], MD  HPI/Recap of past 24 hours:  C/o cough, nasal congestion, denies chest pain, no dysphagia, no sob, husband at bedside  Assessment/Plan: Principal Problem:   Acute respiratory failure with hypoxia ([place]) Active Problems:   Essential hypertension   Chronic kidney disease   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)   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; now atretic and significantly improved LAD lesion without intervention   Acute respiratory failure with hypoxia Initially treated for COPD exacerbation, however, patient without significant evidence for COPD. No pneumonia seen on CT scan. Viral panel + metapneumovirus. - supportive care and wean oxygen as able -Albuterol prn -no wheezing on 12/17   Recurrent lung tumor Patient is s/p VATS with wedge resection and biopsy of left upper lobe in 2016. CT today shows recurrence.  - thoracic surgery Dr. Servando Snare consulted who recommended have PET scan and outpatient follow up with pulmonology and thoracic surgery   Hypertension -continue metoprolol -continue losartan  CAD s/p CABG -continue lipitor/plavix ( report allergic to asa)   CVA (TIA in 2016) -continue statin/Plavix  CKD stage III Stable  Adrenal mass -stable  Hypothyroidism -continue levothyroxine  Rheumatoid arthritis on leflunomide  DVT prophylaxis: Lovenox Code Status: Full code Family Communication: husband at bedside Disposition Plan: Discharge home in 1-2 days Consults called: thoracic surgery   Procedures:  none  Antibiotics:  none   Objective: BP (!) 174/65 (BP Location: Right Arm)   Pulse 82   Temp 97.8 F (36.6 C) (Oral)   Resp 18   Ht 5\' 3"  (1.6 m)   Wt 67.9 kg (149 lb 12.8 oz)   SpO2 93%   BMI 26.54 kg/m   Intake/Output Summary (Last 24 hours) at 04/16/16 1003 Last data filed at  04/16/16 0818  Gross per 24 hour  Intake              360 ml  Output                0 ml  Net              360 ml   Filed Weights   04/15/16 1153 04/15/16 1900  Weight: 68 kg (150 lb) 67.9 kg (149 lb 12.8 oz)    Exam:   General:  Intermittent cough, NAD  Cardiovascular: RRR  Respiratory: CTABL  Abdomen: Soft/ND/NT, positive BS  Musculoskeletal: No Edema  Neuro: aaox3  Data Reviewed: Basic Metabolic Panel:  Recent Labs Lab 04/15/16 1300 04/16/16 0553  NA 137 136  K 4.1 4.5  CL 100* 102  CO2 24 25  GLUCOSE 102* 156*  BUN 20 21*  CREATININE 1.31* 1.19*  CALCIUM 9.5 9.5   Liver Function Tests: No results for input(s): AST, ALT, ALKPHOS, BILITOT, PROT, ALBUMIN in the last 168 hours. No results for input(s): LIPASE, AMYLASE in the last 168 hours. No results for input(s): AMMONIA in the last 168 hours. CBC:  Recent Labs Lab 04/15/16 1200 04/16/16 0553  WBC 8.5 5.4  NEUTROABS 5.6  --   HGB 13.0 12.3  HCT 38.1 36.9  MCV 90.9 90.4  PLT 642* 589*   Cardiac Enzymes:   No results for input(s): CKTOTAL, CKMB, CKMBINDEX, TROPONINI in the last 168 hours. BNP (last 3 results) No results for input(s): BNP in the last 8760 hours.  ProBNP (last 3 results) No results for input(s): PROBNP in  the last 8760 hours.  CBG: No results for input(s): GLUCAP in the last 168 hours.  No results found for this or any previous visit (from the past 240 hour(s)).   Studies: Ct Angio Chest Pe W And/or Wo Contrast  Result Date: 04/15/2016 CLINICAL DATA:  Elevated D-dimer.  Productive cough congestion. EXAM: CT ANGIOGRAPHY CHEST WITH CONTRAST TECHNIQUE: Multidetector CT imaging of the chest was performed using the standard protocol during bolus administration of intravenous contrast. Multiplanar CT image reconstructions and MIPs were obtained to evaluate the vascular anatomy. CONTRAST:  80 cc Isovue 370 IV COMPARISON:  11/23/2015 FINDINGS: Cardiovascular: No filling defects in the  pulmonary arteries to suggest pulmonary emboli. Heart is normal size. Prior CABG. Aorta is tortuous with scattered calcifications, non aneurysmal. Mediastinum/Nodes: Mildly enlarged mediastinal lymph nodes. Subcarinal lymph node has a short axis diameter of 13 mm. Other small scattered paratracheal and AP window lymph nodes. No hilar or axillary adenopathy. Lungs/Pleura: Increasing density noted in the medial left upper lobe in the area of prior postoperative change. This soft tissue measures 2.2 x 1.7 cm on image 15. Given the location in the postoperative bed, this is concerning for tumor recurrence. Nodule in the left lower lobe on image 38 measures 8 mm on image 38, stable. Scarring noted in the lung bases. No pleural effusions. Upper Abdomen: Imaging into the upper abdomen shows no acute findings. Musculoskeletal: Chest wall soft tissues are unremarkable. No acute bony abnormality or focal bone lesion. Review of the MIP images confirms the above findings. IMPRESSION: Enlarging abnormal soft tissue in the left upper lobe at the prior surgical site concerning for recurrent tumor. This measures up to 2.2 cm. This could be further evaluated with PET CT. Stable left lower lobe pulmonary nodule. Borderline and mildly enlarged mediastinal lymph nodes. Electronically Signed   By: Rolm Baptise M.D.   On: 04/15/2016 15:26    Scheduled Meds: . atorvastatin  80 mg Oral Daily  . clopidogrel  75 mg Oral Daily  . enoxaparin (LOVENOX) injection  40 mg Subcutaneous Q24H  . [START ON 04/17/2016] leflunomide  20 mg Oral QODAY  . levothyroxine  88 mcg Oral QAC breakfast  . loratadine  10 mg Oral Daily  . losartan  25 mg Oral Daily  . mouth rinse  15 mL Mouth Rinse BID  . metoprolol succinate  25 mg Oral Daily    Continuous Infusions:   Time spent: 22mins  Kodi Guerrera MD, PhD  [facility] Pager [phone]. If 7PM-7AM, please contact night-coverage at [email or web address], password [facility] 04/16/2016, 10:03 AM  LOS: 1  day

## 2016-04-16 NOTE — Progress Notes (Signed)
[name]       [name] [id number]             [phone]        [name] [place] Medical Record #[id number] Date of Birth: [birth year]  Referring: No ref. provider found Primary Care: [name], MD Pulmonary: Dr Melvyn Novas  Chief Complaint:    Cough and short of breath  History of Present Illness:  Asked to review films by Dr Erlinda Hong   Patient known from left upper VATS wedge resection in 03/2015 for what was granulomatous disease.  She does not have previous dx of Lung cancer.  Follow up  CT in July 2017 reviewed.  Patient now admitted with respiratory distress. Repeat ct of chest done with enlarging are left upper lobe pleural based, not at area of previous wedge resection (patient did not have lobectomy as some notes indicate.        Diagnosis  03/02/2015 OPERATIVE REPORT PREOPERATIVE DIAGNOSIS: Left upper lobe lung mass POSTOPERATIVE DIAGNOSIS: Left upper lobe lung mass. 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 PROCEDURE PERFORMED: Video bronchoscopy, left video-assisted thoracoscopy, minithoracotomy, wedge resection of left upper lobe. SURGEON: Lanelle Bal, M.D Previous left VATS lung resection Lung, wedge biopsy/resection, left upper lobe - NECROTIZING GRANULOMA, 1.7 CM. - NO TUMOR SEEN. - SEE COMMENT. Microscopic Comment Sections demonstrate a 1.7 cm necrotizing granuloma. There is associated reactive pneumocyte hyperplasia with reactive atypia but no true tumor is identified. An AFB stain is performed on a representative section which is negative for acid fast bacilli. A GMS and PAS stain are also performed on a representative section which are both negative for fungal organisms. Although specimen stains for microorganisms are negative, stains are not as sensitive as other microbiologic techniques including culture. Please correlate with culture results. (RAH:gt, 03/04/15) [name] Pathologist, Electronic  Signature (Case signed 03/04/2015    Past Medical History:  Diagnosis Date  . Abdominal aortic aneurysm (HCC)    REPAIRED IN 1993 BY [name]  AND HAS RECENTLY BEEN FOLLOWED BY [name]  . 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 Adenomatous colon polyp    tubular  . Anemia   . CAD in native artery 1996, 2002, 2005    Status post CABG x1 with LIMA-LAD for ostial LAD 90% stenosis --> down to 50% in 2002 and 30% in 2005.;  Atretic LIMA; Myoview 06/2010: Fixed anteroseptal, apical and inferoapical defect with moderate size. Most likely scar. Mild subendocardial ischemia. EF 71% LOW RISK.   [name] Cervical disc disease   . Chronic kidney disease   . COPD (chronic obstructive pulmonary disease) ([place])   . Diverticulosis   . Hyperlipidemia   . Hypertension   . Hypothyroidism (acquired)    hypo  . Myocardial infarction Aug. 2016   TIA  . Polymyalgia rheumatica (HCC)    2011 Dr. Charlestine Night  . Rheumatoid arthritis ([place]) 2011   Dr.Truslow; fracture knees, hands and wrists -   . S/P CABG x 1 1996   CABG--LIMA-LAD for ostial LAD (not felt to be PCI amenable). EF NORMAL then; LIMA now atretic  . Shortness of breath dyspnea    with exertion  . Stroke [facility]) 11-2014   TIA   . Urinary frequency     Past Surgical History:  Procedure Laterality Date  . ABDOMINAL AORTIC ANEURYSM REPAIR  0737   Complicated by mesenteric  artery stenosis and splenic artery infarction with acquired Asplenia  . BUNIONECTOMY  07/2011   right foot  . CARDIAC CATHETERIZATION  2005   (Most recent CATH) - ostial LAD lesion 20-30% (down from 90% initially). Atretic LIMA. Minimal disease the RCA and Circumflex system.  [name] CARPAL TUNNEL RELEASE Left   . CATARACT EXTRACTION Bilateral   . CERVICAL SPINE SURGERY     plate 2008 Dr. Saintclair Halsted  . [place]   INCLUDED AN INTERNAL MAMMARY ARTERY TO THE LAD. EF WAS NORMAL  . INGUINAL  HERNIA REPAIR Right   . NM MYOVIEW LTD  March 2012   Fixed anteroseptal, apical and inferoapical defect with moderate size. Most likely scar. Mild subendocardial ischemia. EF 71% LOW RISK.   [name] SPLENECTOMY    . TRANSTHORACIC ECHOCARDIOGRAM  12/2014   [facility]: Normal LV size & function. EF 55-60%,   . vagina polyp    . VIDEO ASSISTED THORACOSCOPY (VATS)/WEDGE RESECTION Left 03/02/2015   Procedure: VIDEO ASSISTED THORACOSCOPY (VATS), MINI THORACOTOMY, LEFT UPPER LOBE WEDGE, TAKE DOWN OF INTERNAL MAMMARY LESIONS, PLACEMENT OF ON-Q PUMP;  Surgeon: Grace Isaac, MD;  Location: [place];  Service: Thoracic;  Laterality: Left;  [name] VIDEO BRONCHOSCOPY N/A 03/02/2015   Procedure: BRONCHOSCOPY;  Surgeon: Grace Isaac, MD;  Location: [place];  Service: Thoracic;  Laterality: N/A;    History  Smoking Status  . Former Smoker  . Quit date: 12/04/1958  Smokeless Tobacco  . Never Used      Diagnostic Studies & Laboratory data:     Recent Radiology Findings:   Dg Chest 2 View  Result Date: 04/15/2016 CLINICAL DATA:  Cough for 1 month EXAM: CHEST  2 VIEW COMPARISON:  04/03/2016 FINDINGS: There is hyperinflation of the lungs compatible with COPD. Prior CABG. Heart is normal size. No focal airspace opacities or effusions. No acute bony abnormality. IMPRESSION: COPD.  No active disease. Electronically Signed   By: Rolm Baptise M.D.   On: 04/15/2016 10:13   Ct Angio Chest Pe W And/or Wo Contrast  Result Date: 04/15/2016 CLINICAL DATA:  Elevated D-dimer.  Productive cough congestion. EXAM: CT ANGIOGRAPHY CHEST WITH CONTRAST TECHNIQUE: Multidetector CT imaging of the chest was performed using the standard protocol during bolus administration of intravenous contrast. Multiplanar CT image reconstructions and MIPs were obtained to evaluate the vascular anatomy. CONTRAST:  80 cc Isovue 370 IV COMPARISON:  11/23/2015 FINDINGS: Cardiovascular: No filling defects in the pulmonary arteries to suggest  pulmonary emboli. Heart is normal size. Prior CABG. Aorta is tortuous with scattered calcifications, non aneurysmal. Mediastinum/Nodes: Mildly enlarged mediastinal lymph nodes. Subcarinal lymph node has a short axis diameter of 13 mm. Other small scattered paratracheal and AP window lymph nodes. No hilar or axillary adenopathy. Lungs/Pleura: Increasing density noted in the medial left upper lobe in the area of prior postoperative change. This soft tissue measures 2.2 x 1.7 cm on image 15. Given the location in the postoperative bed, this is concerning for tumor recurrence. Nodule in the left lower lobe on image 38 measures 8 mm on image 38, stable. Scarring noted in the lung bases. No pleural effusions. Upper Abdomen: Imaging into the upper abdomen shows no acute findings. Musculoskeletal: Chest wall soft tissues are unremarkable. No acute bony abnormality or focal bone lesion. Review of the MIP images confirms the above findings. IMPRESSION: Enlarging abnormal soft tissue in the left upper lobe at the prior surgical site concerning for recurrent tumor. This measures up  to 2.2 cm. This could be further evaluated with PET CT. Stable left lower lobe pulmonary nodule. Borderline and mildly enlarged mediastinal lymph nodes. Electronically Signed   By: Rolm Baptise M.D.   On: 04/15/2016 15:26    Study Result   CLINICAL DATA:  Followup left lung nodule. Previous wedge resection revealed necrotizing granuloma. EXAM: CT CHEST WITHOUT CONTRAST TECHNIQUE: Multidetector CT imaging of the chest was performed following the standard protocol without IV contrast. COMPARISON:  01/29/2015 FINDINGS: Cardiovascular: Heart size within normal limits. Aortic atherosclerosis noted. Previous CABG. Mediastinum/Lymph Nodes: No masses or pathologically enlarged lymph nodes identified on this un-enhanced exam. Lungs/Pleura: Postop changes seen in the medial left upper lobe from previous wedge resection. 8 mm mean diameter  perifissural nodule nodule is seen in the left midlung on image 66/4. This is new since previous study, but likely represents an intrapulmonary lymph node. Mild centrilobular emphysema noted. Bibasilar scarring and tiny sub-cm posterior right lower lobe pulmonary nodule remains stable. No evidence of acute infiltrate or pleural effusion. Upper abdomen: No acute findings. Musculoskeletal: No chest wall mass or suspicious bone lesions identified. IMPRESSION: Postop changes in left upper lobe. 8 mm mean diameter perifissural nodule in the left midlung is new, but likely represents an intrapulmonary lymph node. Non-contrast chest CT at 6-12 months is recommended. If the nodule is stable at time of repeat CT, then future CT at 18-24 months (from today's scan) is considered optional for low-risk patients, but is recommended for high-risk patients. This recommendation follows the consensus statement: Guidelines for Management of Incidental Pulmonary Nodules Detected on CT Images:From the Fleischner Society 2017; published online before print (10.1148/radiol.1856314970). Aortic atherosclerosis and emphysema. Electronically Signed   By: Earle Gell M.D.   On: 11/23/2015 15:46      I have independently reviewed the above radiologic studies.  Recent Lab Findings: Lab Results  Component Value Date   WBC 5.4 04/16/2016   HGB 12.3 04/16/2016   HCT 36.9 04/16/2016   PLT 589 (H) 04/16/2016   GLUCOSE 156 (H) 04/16/2016   CHOL 178 12/23/2015   TRIG 86.0 12/23/2015   HDL 87.70 12/23/2015   LDLDIRECT 162.1 10/28/2009   LDLCALC 73 12/23/2015   ALT 29 12/23/2015   AST 20 12/23/2015   NA 136 04/16/2016   K 4.5 04/16/2016   CL 102 04/16/2016   CREATININE 1.19 (H) 04/16/2016   BUN 21 (H) 04/16/2016   CO2 25 04/16/2016   TSH 1.65 12/23/2015   INR 0.96 04/15/2016   10/2015   03/2016   PFT's 10 /2016 FEV1 1.58 80% DLCO 16.27 71%  Interpretation: The FEV1 is normal, but the FEV1/FVC  ratio and FEF25-75% are reduced. While the TLC, FRC and SVC are within normal limits, the RV is increased. Following administration of bronchodilators, there is a slight response. The reduced diffusing capacity indicates a mild loss of functional alveolar capillary surface. However, the diffusing capacity was not corrected for the patient's hemoglobin. Pulmonary Function Diagnosis: Minimal Obstructive Airways Disease with reversibility Mild Diffusion Defect   Assessment / Plan:     Active treatment for current acute respiratory symptoms Will need follow up PET scan as outpatient when lungs congestion sob has cleared as out patient  Appointment to see me as outpatient , also follow up with her pulmonologist     Grace Isaac MD      [name] [place] [address] Office [phone]   Beeper [phone]  04/16/2016 1:32 PM

## 2016-04-16 NOTE — Care Management CC44 (Signed)
Condition Code 44 Documentation Completed  Patient Details  Name: JESYKA SLAGHT MRN: [id number] Date of Birth: 12-Jun-1940   Condition Code 44 given:   yes Patient signature on Condition Code 15 notice:yes    Documentation of 2 MD's agreement: yes   Code 44 added to claim: yes      Delrae Sawyers, RN 04/16/2016, 3:10 PM

## 2016-04-16 NOTE — Care Management Obs Status (Signed)
[place] NOTIFICATION   Patient Details  Name: [name] MRN: [id number] Date of Birth: 02/10/1941   Medicare Observation Status Notification Given:  Yes    CrutchfieldAntony Haste, RN 04/16/2016, 3:11 PM

## 2016-04-17 ENCOUNTER — Observation Stay (HOSPITAL_COMMUNITY): Payer: Medicare Other

## 2016-04-17 ENCOUNTER — Other Ambulatory Visit: Payer: Self-pay | Admitting: *Deleted

## 2016-04-17 ENCOUNTER — Telehealth: Payer: Self-pay | Admitting: Internal Medicine

## 2016-04-17 DIAGNOSIS — R918 Other nonspecific abnormal finding of lung field: Secondary | ICD-10-CM

## 2016-04-17 DIAGNOSIS — J9601 Acute respiratory failure with hypoxia: Secondary | ICD-10-CM | POA: Diagnosis not present

## 2016-04-17 LAB — LACTIC ACID, PLASMA: Lactic Acid, Venous: 1.2 mmol/L (ref 0.5–1.9)

## 2016-04-17 LAB — COMPREHENSIVE METABOLIC PANEL
ALK PHOS: 111 U/L (ref 38–126)
ALT: 15 U/L (ref 14–54)
ANION GAP: 10 (ref 5–15)
AST: 26 U/L (ref 15–41)
Albumin: 3.6 g/dL (ref 3.5–5.0)
BILIRUBIN TOTAL: 0.6 mg/dL (ref 0.3–1.2)
BUN: 26 mg/dL — ABNORMAL HIGH (ref 6–20)
CALCIUM: 9.6 mg/dL (ref 8.9–10.3)
CO2: 27 mmol/L (ref 22–32)
CREATININE: 1.27 mg/dL — AB (ref 0.44–1.00)
Chloride: 103 mmol/L (ref 101–111)
GFR calc non Af Amer: 40 mL/min — ABNORMAL LOW (ref >60)
GFR, EST AFRICAN AMERICAN: 46 mL/min — AB (ref >60)
Glucose, Bld: 87 mg/dL (ref 65–99)
Potassium: 3.9 mmol/L (ref 3.5–5.1)
Sodium: 140 mmol/L (ref 135–145)
TOTAL PROTEIN: 7 g/dL (ref 6.5–8.1)

## 2016-04-17 LAB — CBC WITH DIFFERENTIAL/PLATELET
Basophils Absolute: 0.1 10*3/uL (ref 0.0–0.1)
Basophils Relative: 1 %
Eosinophils Absolute: 0.1 10*3/uL (ref 0.0–0.7)
Eosinophils Relative: 1 %
HEMATOCRIT: 37.3 % (ref 36.0–46.0)
HEMOGLOBIN: 12.3 g/dL (ref 12.0–15.0)
LYMPHS ABS: 3.7 10*3/uL (ref 0.7–4.0)
Lymphocytes Relative: 27 %
MCH: 29.9 pg (ref 26.0–34.0)
MCHC: 33 g/dL (ref 30.0–36.0)
MCV: 90.5 fL (ref 78.0–100.0)
MONOS PCT: 12 %
Monocytes Absolute: 1.7 10*3/uL — ABNORMAL HIGH (ref 0.1–1.0)
Neutro Abs: 8.1 10*3/uL — ABNORMAL HIGH (ref 1.7–7.7)
Neutrophils Relative %: 59 %
Platelets: 569 10*3/uL — ABNORMAL HIGH (ref 150–400)
RBC: 4.12 MIL/uL (ref 3.87–5.11)
RDW: 15.5 % (ref 11.5–15.5)
WBC: 13.6 10*3/uL — AB (ref 4.0–10.5)

## 2016-04-17 MED ORDER — SODIUM CHLORIDE 0.9 % IV SOLN
INTRAVENOUS | Status: DC
  Administered 2016-04-17: 17:00:00 via INTRAVENOUS

## 2016-04-17 MED ORDER — DOXYCYCLINE HYCLATE 100 MG PO TABS
100.0000 mg | ORAL_TABLET | Freq: Two times a day (BID) | ORAL | Status: DC
  Administered 2016-04-17 – 2016-04-18 (×2): 100 mg via ORAL
  Filled 2016-04-17 (×2): qty 1

## 2016-04-17 MED ORDER — ENSURE ENLIVE PO LIQD: 237.0000 mL | Freq: Two times a day (BID) | ORAL | Status: DC

## 2016-04-17 MED ORDER — IPRATROPIUM-ALBUTEROL 0.5-2.5 (3) MG/3ML IN SOLN: 3.0000 mL | Freq: Four times a day (QID) | RESPIRATORY_TRACT | Status: DC

## 2016-04-17 MED ORDER — ADULT MULTIVITAMIN W/MINERALS CH
1.0000 | ORAL_TABLET | Freq: Every day | ORAL | Status: DC
  Administered 2016-04-17 – 2016-04-18 (×2): 1 via ORAL
  Filled 2016-04-17 (×2): qty 1

## 2016-04-17 NOTE — Telephone Encounter (Signed)
OK - pls use acute appt slot Thx

## 2016-04-17 NOTE — Progress Notes (Addendum)
PROGRESS NOTE  [name] JSH:[id number] DOB: 1940-05-11 DOA: 04/15/2016 PCP: [name], MD  HPI/Recap of past 24 hours:  Persist cough, nasal congestion, she report not getting any better denies chest pain, no dysphagia, no sob, husband at bedside  Assessment/Plan: Principal Problem:   Acute respiratory failure with hypoxia [facility]) Active Problems:   Essential hypertension   Chronic kidney disease   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)   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; now atretic and significantly improved LAD lesion without intervention   Acute respiratory failure with hypoxemia ([place])   Acute respiratory failure with hypoxia with cough, failed outpatient treatment Patient report she started have cough 4weeks ago, she was seen by primary care doctor on 12/4 and on 12/16, she was treated with oral abx cedinir, however her symptom did not improve, she is sent by pmd to the hospital due to persistent symptom and she started to have tachycardia and reduced oxygen level She presented to the hospital , initially her oxygen saturation was in the 80's, she is tachycardia in the 100's, tachypnea , her respiration rate stayed around 28 in the ED, at one point her respiration rate in the Ed got up to 45. She has mild lactic acidosis on presentation as well Initial CTA no pneumonia, no PE,  Viral panel + metapneumovirus. - supportive care and wean oxygen as able, Albuterol prn -she report did not feel any better, she continued to cough, feeling weak. Due to significant underline immunosuppressed status, no clinical improvement and now has mild leukocytosis on 12/18 , will empirically start doxycyline to prevent/treat bacterial superinfection. Will repeat cxr.   Addendum: patient is significantly orthostatic, systolic blood pressure laying at 188, standing at 108, will start ivf.  Immunosuppressed status with h/o splenectomy and on  immunosuppressive agent leflunomide chronically for rheumatoid arthritis   Recurrent lung tumor Patient is s/p VATS with wedge resection and biopsy of left upper lobe in 2016. CT today shows recurrence.  - thoracic surgery Dr. Servando Snare consulted who recommended have PET scan and outpatient follow up with pulmonology and thoracic surgery   Hypertension -continue metoprolol -continue losartan  CAD s/p CABG -continue lipitor/plavix ( report allergic to asa)   CVA (TIA in 2016) -continue statin/Plavix  CKD stage III Stable  Adrenal mass -stable  Hypothyroidism -continue levothyroxine    DVT prophylaxis: Lovenox Code Status: Full code Family Communication: husband at bedside Disposition Plan: Discharge home in 1-2 days Consults called: thoracic surgery   Procedures:  none  Antibiotics:  none   Objective: BP 121/67 (BP Location: Right Arm)   Pulse 87   Temp 98.4 F (36.9 C) (Oral)   Resp 20   Ht 5\' 3"  (1.6 m)   Wt 67.9 kg (149 lb 12.8 oz)   SpO2 94%   BMI 26.54 kg/m   Intake/Output Summary (Last 24 hours) at 04/17/16 1312 Last data filed at 04/17/16 0857  Gross per 24 hour  Intake              460 ml  Output                0 ml  Net              460 ml   Filed Weights   04/15/16 1153 04/15/16 1900  Weight: 68 kg (150 lb) 67.9 kg (149 lb 12.8 oz)    Exam:   General:  Intermittent cough, NAD  Cardiovascular: RRR  Respiratory: CTABL  Abdomen: Soft/ND/NT, positive BS  Musculoskeletal: No Edema  Neuro: aaox3  Data Reviewed: Basic Metabolic Panel:  Recent Labs Lab 04/15/16 1300 04/16/16 0553 04/17/16 0531  NA 137 136 140  K 4.1 4.5 3.9  CL 100* 102 103  CO2 24 25 27   GLUCOSE 102* 156* 87  BUN 20 21* 26*  CREATININE 1.31* 1.19* 1.27*  CALCIUM 9.5 9.5 9.6   Liver Function Tests:  Recent Labs Lab 04/17/16 0531  AST 26  ALT 15  ALKPHOS 111  BILITOT 0.6  PROT 7.0  ALBUMIN 3.6   No results for input(s): LIPASE,  AMYLASE in the last 168 hours. No results for input(s): AMMONIA in the last 168 hours. CBC:  Recent Labs Lab 04/15/16 1200 04/16/16 0553 04/17/16 0531  WBC 8.5 5.4 13.6*  NEUTROABS 5.6  --  8.1*  HGB 13.0 12.3 12.3  HCT 38.1 36.9 37.3  MCV 90.9 90.4 90.5  PLT 642* 589* 569*   Cardiac Enzymes:   No results for input(s): CKTOTAL, CKMB, CKMBINDEX, TROPONINI in the last 168 hours. BNP (last 3 results) No results for input(s): BNP in the last 8760 hours.  ProBNP (last 3 results) No results for input(s): PROBNP in the last 8760 hours.  CBG: No results for input(s): GLUCAP in the last 168 hours.  Recent Results (from the past 240 hour(s))  Respiratory Panel by PCR     Status: Abnormal   Collection Time: 04/15/16  8:52 PM  Result Value Ref Range Status   Adenovirus NOT DETECTED NOT DETECTED Final   Coronavirus 229E NOT DETECTED NOT DETECTED Final   Coronavirus HKU1 NOT DETECTED NOT DETECTED Final   Coronavirus NL63 NOT DETECTED NOT DETECTED Final   Coronavirus OC43 NOT DETECTED NOT DETECTED Final   Metapneumovirus DETECTED (A) NOT DETECTED Final   Rhinovirus / Enterovirus NOT DETECTED NOT DETECTED Final   Influenza A NOT DETECTED NOT DETECTED Final   Influenza B NOT DETECTED NOT DETECTED Final   Parainfluenza Virus 1 NOT DETECTED NOT DETECTED Final   Parainfluenza Virus 2 NOT DETECTED NOT DETECTED Final   Parainfluenza Virus 3 NOT DETECTED NOT DETECTED Final   Parainfluenza Virus 4 NOT DETECTED NOT DETECTED Final   Respiratory Syncytial Virus NOT DETECTED NOT DETECTED Final   Bordetella pertussis NOT DETECTED NOT DETECTED Final   Chlamydophila pneumoniae NOT DETECTED NOT DETECTED Final   Mycoplasma pneumoniae NOT DETECTED NOT DETECTED Final    Comment: Performed at [facility]  MRSA PCR Screening     Status: None   Collection Time: 04/16/16 10:25 AM  Result Value Ref Range Status   MRSA by PCR NEGATIVE NEGATIVE Final    Comment:        The GeneXpert MRSA Assay  (FDA approved for NASAL specimens only), is one component of a comprehensive MRSA colonization surveillance program. It is not intended to diagnose MRSA infection nor to guide or monitor treatment for MRSA infections.   Culture, expectorated sputum-assessment     Status: None   Collection Time: 04/16/16  6:18 PM  Result Value Ref Range Status   Specimen Description SPUTUM  Final   Special Requests NONE  Final   Sputum evaluation THIS SPECIMEN IS ACCEPTABLE FOR SPUTUM CULTURE  Final   Report Status 04/16/2016 FINAL  Final  Culture, respiratory (NON-Expectorated)     Status: None (Preliminary result)   Collection Time: 04/16/16  6:18 PM  Result Value Ref Range Status   Specimen Description SPUTUM  Final   Special Requests NONE  Final   Gram Stain   Final    DEGENERATED CELLULAR MATERIAL PRESENT ABUNDANT GRAM NEGATIVE COCCOBACILLI FEW GRAM POSITIVE COCCI IN PAIRS IN CLUSTERS RARE GRAM POSITIVE RODS Performed at [facility]    Culture PENDING  Incomplete   Report Status PENDING  Incomplete     Studies: No results found.  Scheduled Meds: . atorvastatin  80 mg Oral Daily  . clopidogrel  75 mg Oral Daily  . doxycycline  100 mg Oral Q12H  . enoxaparin (LOVENOX) injection  40 mg Subcutaneous Q24H  . famotidine  10 mg Oral QHS  . feeding supplement (ENSURE ENLIVE)  237 mL Oral BID BM  . fluticasone  2 spray Each Nare Daily  . guaiFENesin  600 mg Oral BID  . ipratropium-albuterol  3 mL Nebulization Q6H  . leflunomide  20 mg Oral QODAY  . levothyroxine  88 mcg Oral QAC breakfast  . loratadine  10 mg Oral Daily  . losartan  25 mg Oral Daily  . mouth rinse  15 mL Mouth Rinse BID  . metoprolol succinate  25 mg Oral Daily  . multivitamin with minerals  1 tablet Oral Daily    Continuous Infusions:   Time spent: 97mins  Kamrynn Melott MD, PhD  [facility] Pager [phone]. If 7PM-7AM, please contact night-coverage at [email or web address], password [facility] 04/17/2016, 1:12 PM   LOS: 1 day

## 2016-04-17 NOTE — Progress Notes (Signed)
Pt BP 170/86. MD notified. Pt is asymptomatic. Will continue to monitor.

## 2016-04-17 NOTE — Progress Notes (Signed)
Initial Nutrition Assessment  DOCUMENTATION CODES:   Not applicable  INTERVENTION:   Ensure Enlive po BID, each supplement provides 350 kcal and 20 grams of protein  Snacks  Multivitamin   NUTRITION DIAGNOSIS:   Inadequate oral intake related to poor appetite as evidenced by per patient/family report.  GOAL:   Patient will meet greater than or equal to 90% of their needs  MONITOR:   PO intake, Supplement acceptance  REASON FOR ASSESSMENT:   Malnutrition Screening Tool    ASSESSMENT:   75 y.o. female with medical history significant of COPD, CAD s/p CABG x1, PVD, CVA, AAA s/p repair. Symptoms started about a month ago with a nonproductive cough. Symptoms remain persistent and she went to primary care physician about 2 weeks ago. She is prescribed antibiotics at that time which helped for about 1 day, but symptoms continue to persist. She eventually came to the emergency department because her symptoms continued to worsen. She reports no aggravating symptoms. He has mild dyspnea at rest.   Met with pt in room today. Pt reports poor appetite and oral intake for 2 weeks pta r/t cough. Pt reports appetite is improving. Pt eating 50-75% meals. Pt reports chronic diarrhea for months. Per chart, Pts weights are stable.  Medications reviewed and include: plavix, lovenox, pepcid, synthroid   Labs reviewed: BUN 26(H), creat 1.27(H) Wbc- 13.6(H)  Nutrition-Focused physical exam completed. Findings are no fat depletion, no muscle depletion, and no edema.   Diet Order:  Diet Heart Room service appropriate? Yes; Fluid consistency: Thin  Skin:  Reviewed, no issues  Last BM:  12/16  Height:   Ht Readings from Last 1 Encounters:  04/15/16 '5\' 3"'  (1.6 m)    Weight:   Wt Readings from Last 1 Encounters:  04/15/16 149 lb 12.8 oz (67.9 kg)    Ideal Body Weight:  52.2 kg  BMI:  Body mass index is 26.54 kg/m.  Estimated Nutritional Needs:   Kcal:  1700-2000kcal/day    Protein:  74-88g/day   Fluid:  >1.5L/day   EDUCATION NEEDS:   No education needs identified at this time  Koleen Distance, RD, LDN

## 2016-04-17 NOTE — Telephone Encounter (Signed)
Pt is in the hosp and per hosp needs to fu by thurs.  Found something in her lung and there is a lot of different issue going on right now and she only wants to see Dr Camila Li.  There are no appt available this week.  What needs to be done?

## 2016-04-17 NOTE — Progress Notes (Signed)
Date: April 17, 2016 ABN given to patient signed.  Copy to the husband Velva Harman, RN, [place], [place] [phone]

## 2016-04-18 ENCOUNTER — Telehealth: Payer: Self-pay | Admitting: *Deleted

## 2016-04-18 DIAGNOSIS — J9601 Acute respiratory failure with hypoxia: Secondary | ICD-10-CM | POA: Diagnosis not present

## 2016-04-18 LAB — CBC WITH DIFFERENTIAL/PLATELET
BASOS ABS: 0.1 10*3/uL (ref 0.0–0.1)
Basophils Relative: 1 %
Eosinophils Absolute: 0.1 10*3/uL (ref 0.0–0.7)
Eosinophils Relative: 1 %
HCT: 35.5 % — ABNORMAL LOW (ref 36.0–46.0)
Hemoglobin: 11.8 g/dL — ABNORMAL LOW (ref 12.0–15.0)
LYMPHS ABS: 2.6 10*3/uL (ref 0.7–4.0)
Lymphocytes Relative: 22 %
MCH: 30.3 pg (ref 26.0–34.0)
MCHC: 33.2 g/dL (ref 30.0–36.0)
MCV: 91 fL (ref 78.0–100.0)
MONO ABS: 1.3 10*3/uL — AB (ref 0.1–1.0)
MONOS PCT: 11 %
Neutro Abs: 7.9 10*3/uL — ABNORMAL HIGH (ref 1.7–7.7)
Neutrophils Relative %: 65 %
PLATELETS: 502 10*3/uL — AB (ref 150–400)
RBC: 3.9 MIL/uL (ref 3.87–5.11)
RDW: 15.6 % — AB (ref 11.5–15.5)
WBC: 12 10*3/uL — AB (ref 4.0–10.5)

## 2016-04-18 LAB — BASIC METABOLIC PANEL
Anion gap: 8 (ref 5–15)
BUN: 26 mg/dL — ABNORMAL HIGH (ref 6–20)
CALCIUM: 9.2 mg/dL (ref 8.9–10.3)
CO2: 26 mmol/L (ref 22–32)
CREATININE: 1.22 mg/dL — AB (ref 0.44–1.00)
Chloride: 108 mmol/L (ref 101–111)
GFR calc non Af Amer: 42 mL/min — ABNORMAL LOW (ref >60)
GFR, EST AFRICAN AMERICAN: 48 mL/min — AB (ref >60)
Glucose, Bld: 89 mg/dL (ref 65–99)
Potassium: 4.1 mmol/L (ref 3.5–5.1)
SODIUM: 142 mmol/L (ref 135–145)

## 2016-04-18 MED ORDER — ENSURE ENLIVE PO LIQD: 237.0000 mL | Freq: Two times a day (BID) | ORAL | 12 refills | Status: DC

## 2016-04-18 MED ORDER — DOXYCYCLINE HYCLATE 100 MG PO TABS: 100.0000 mg | ORAL_TABLET | Freq: Two times a day (BID) | ORAL | 0 refills | Status: AC

## 2016-04-18 MED ORDER — FAMOTIDINE 10 MG PO TABS: 10.0000 mg | ORAL_TABLET | Freq: Every day | ORAL | 0 refills | Status: DC

## 2016-04-18 MED ORDER — GUAIFENESIN ER 600 MG PO TB12: 600.0000 mg | ORAL_TABLET | Freq: Two times a day (BID) | ORAL | 0 refills | Status: DC

## 2016-04-18 MED ORDER — FLUTICASONE PROPIONATE 50 MCG/ACT NA SUSP: 2.0000 | Freq: Every day | NASAL | 0 refills | Status: DC

## 2016-04-18 NOTE — Progress Notes (Signed)
Report received from previous RN. I agree with all documentation, will continue to monitor pt. Closely.

## 2016-04-18 NOTE — Telephone Encounter (Signed)
Transition Care Management Follow-up Telephone Call   Date discharged? 04/18/16   How have you been since you were released from the hospital? Pt states she seems to be doing ok   Do you understand why you were in the hospital? YES   Do you understand the discharge instructions? YES   Where were you discharged to? Home   Items Reviewed:  Medications reviewed: YES  Allergies reviewed: YES   Dietary changes reviewed: YES, heart healthy  Referrals reviewed: NO   Functional Questionnaire:   Activities of Daily Living (ADLs):   She states she are independent in the following: bathing and hygiene, feeding, continence, grooming, toileting and dressing States she require assistance with the following: ambulation   Any transportation issues/concerns?: NO   Any patient concerns? NO   Confirmed importance and date/time of follow-up visits scheduled YES, appt 04/19/16  Provider Appointment booked with Dr. Alain Marion  Confirmed with patient if condition begins to worsen call PCP or go to the ER.  Patient was given the office number and encouraged to call back with question or concerns.  : YES

## 2016-04-18 NOTE — Telephone Encounter (Signed)
I called pt- OV is scheduled 04/19/16 @ 4:00 pm. Pt informed

## 2016-04-18 NOTE — Telephone Encounter (Signed)
Erroneous encounter

## 2016-04-18 NOTE — Discharge Summary (Addendum)
Discharge Summary  [name] ZOX:[id number] DOB: 17-Sep-1940  PCP: [name], MD  Admit date: 04/15/2016 Discharge date: 04/18/2016  Time spent: <69mins  Recommendations for Outpatient Follow-up:  1. F/u with PMD within a week  for hospital discharge follow up, repeat cbc/bmp at follow up, pmd to arrange pet scan for lung mass 2. F/u with pulmonology  3. F/u with thoracic surgery.  Discharge Diagnoses:  Active Hospital Problems   Diagnosis Date Noted  . Acute respiratory failure with hypoxia ([place]) 04/15/2016  . Acute respiratory failure with hypoxemia ([place]) 04/16/2016  . Essential hypertension 12/08/2006  . Chronic kidney disease 12/08/2006  . 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) 12/22/1994    Class: History of  . 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; now atretic and significantly improved LAD lesion without intervention 12/22/1994    Class: History of    Resolved Hospital Problems   Diagnosis Date Noted Date Resolved  No resolved problems to display.    Discharge Condition: stable  Diet recommendation: heart healthy  Filed Weights   04/15/16 1153 04/15/16 1900  Weight: 68 kg (150 lb) 67.9 kg (149 lb 12.8 oz)    History of present illness:  Chief Complaint: Coughing  HPI: [name] is a 75 y.o. female with medical history significant of COPD, CAD s/p CABG x1, PVD, CVA, AAA s/p repair. Symptoms started about a month ago with a nonproductive cough. Symptoms remain persistent and she went to primary care physician about 2 weeks ago. She is prescribed antibiotics at that time which helped for about 1 day, but symptoms continue to persist. She eventually came to the emergency department because her symptoms continued to worsen. She reports no aggravating symptoms. He has mild dyspnea at rest.  ED Course: Vitals: Afebrile. Slight tachycardia to 100s. Slightly hypertensive. On 2L O2 via [place] Labs: creatinine 1.31.    Imaging: CT shows abnormal enlarging soft tissue in left upper lobe Medications/Course: Given albuterol, atrovent and solu-medrol.  Hospital Course:  Principal Problem:   Acute respiratory failure with hypoxia [facility]) Active Problems:   Essential hypertension   Chronic kidney disease   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)   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; now atretic and significantly improved LAD lesion without intervention   Acute respiratory failure with hypoxemia ([facility])   Acute respiratory failure with hypoxia with cough, failed outpatient treatment Patient report she started have cough 4weeks ago, she was seen by primary care doctor on 12/4 and on 12/16, she was treated with oral abx cedinir, however her symptom did not improve, she is sent by pmd to the hospital due to persistent symptom and she started to have tachycardia and reduced oxygen level She presented to the hospital, initially her oxygen saturation was in the 80's, she is tachycardia in the 100's, tachypnea , her respiration rate stayed around 28 in the ED, at one point her respiration rate in the Ed got up to 45. She has mild lactic acidosis on presentation as well Initial CTA no pneumonia, no PE,  Viral panel + metapneumovirus. - supportive care and wean oxygen as able, Albuterol prn -she report did not feel any better on 12/18, she continued to cough, feeling weak. Due to significant underline immunosuppressed status, no clinical improvement and now has mild leukocytosis on 12/18 , doxycycline empirically started to prevent/treat bacterial superinfection. repeat cxr no lung infiltrate. 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 CT on admission showed: "Enlarging abnormal soft tissue in the left upper lobe at the prior surgical site concerning for recurrent tumor. This measures up to 2.2 cm. This could be further evaluated with PET CT. Stable left lower lobe pulmonary nodule. Borderline and mildly enlarged mediastinal lymph nodes. - thoracic surgery Dr. Servando Snare consulted who recommended have PET scan and outpatient follow up with pulmonology and thoracic surgery   Hypertension -continue metoprolol -continue losartan  CAD s/p CABG -continue lipitor/plavix ( report allergic to asa)   CVA (TIA in 2016) -continue statin/Plavix  CKD stage III Stable  Adrenal mass -stable  Hypothyroidism -continue levothyroxine    DVT prophylaxis while in the hospital:Lovenox Code Status:Full code Family Communication:husband at bedside Disposition Plan:Discharge home on 12/19 Consults called:thoracic surgery   Procedures:  none  Antibiotics:  Oral doxycycline   Discharge Exam: BP (!) 187/92 (BP Location: Right Arm)   Pulse 94   Temp 98.6 F (37 C) (Oral)   Resp 20   Ht 5\' 3"  (1.6 m)   Wt 67.9 kg (149 lb 12.8 oz)   SpO2 93%   BMI 26.54 kg/m     General: less cough, NAD  Cardiovascular: RRR  Respiratory: CTABL  Abdomen: Soft/ND/NT, positive BS  Musculoskeletal: No Edema  Neuro: aaox3   Discharge Instructions You were cared for by a hospitalist during your hospital stay. If you have any questions about your discharge medications or the care you received while you were in the hospital after you are discharged, you can call the unit and asked to speak with the hospitalist on call if the hospitalist that took care of you is not  available. Once you are discharged, your primary care physician will handle any further medical issues. Please note that NO REFILLS for any discharge medications will be authorized once you are discharged, as it is imperative that you return to your primary care physician (or establish a relationship with a primary care physician if you do not have one) for your aftercare needs so that they can reassess your need for medications and monitor your lab values.  Discharge Instructions    Diet - low sodium heart healthy    Complete by:  As directed    Increase activity slowly    Complete by:  As directed      Allergies as of 04/18/2016      Reactions   Aspirin Other (See Comments)   Hurts stomach   Codeine Nausea And Vomiting   Nitroglycerin Other (See Comments)   Heart rate drops      Medication List    STOP taking these medications   traMADol 50 MG tablet Commonly known as:  ULTRAM   Vitamin D3 2000 units capsule     TAKE these medications   acetaminophen 500 MG tablet Commonly known as:  TYLENOL Take 500 mg by mouth every 6 (six) hours as needed for mild pain.   albuterol 108 (90 Base) MCG/ACT inhaler Commonly known as:  PROVENTIL HFA;VENTOLIN HFA Inhale 2 puffs into  the lungs every 6 (six) hours as needed for wheezing or shortness of breath.   ALIGN 4 MG Caps Take 1 capsule by mouth daily.   atorvastatin 80 MG tablet Commonly known as:  LIPITOR Take 1 tablet by mouth  daily   benzonatate 100 MG capsule Commonly known as:  TESSALON Take 1 capsule (100 mg total) by mouth 3 (three) times daily as needed.   BIOTIN PO Take 1 each by mouth daily.   cetirizine 10 MG tablet Commonly known as:  ZYRTEC Take 10 mg by mouth daily.   clopidogrel 75 MG tablet Commonly known as:  PLAVIX Take 1 tablet by mouth  daily   doxycycline 100 MG tablet Commonly known as:  VIBRA-TABS Take 1 tablet (100 mg total) by mouth 2 (two) times daily.   famotidine 10 MG tablet Commonly  known as:  PEPCID Take 1 tablet (10 mg total) by mouth at bedtime.   feeding supplement (ENSURE ENLIVE) Liqd Take 237 mLs by mouth 2 (two) times daily between meals.   fluticasone 50 MCG/ACT nasal spray Commonly known as:  FLONASE Place 2 sprays into both nostrils daily.   guaiFENesin 600 MG 12 hr tablet Commonly known as:  MUCINEX Take 1 tablet (600 mg total) by mouth 2 (two) times daily.   leflunomide 20 MG tablet Commonly known as:  ARAVA Take 1 tablet (20 mg total) by mouth every other day.   levothyroxine 88 MCG tablet Commonly known as:  SYNTHROID, LEVOTHROID Take 1 tablet by mouth  daily   loperamide 2 MG tablet Commonly known as:  IMODIUM A-D Take 2 mg by mouth 4 (four) times daily as needed for diarrhea or loose stools.   losartan 25 MG tablet Commonly known as:  COZAAR Take 1 tablet by mouth  daily   metoprolol succinate 25 MG 24 hr tablet Commonly known as:  TOPROL-XL Take 1 tablet by mouth  daily   psyllium 58.6 % packet Commonly known as:  METAMUCIL Take 1 packet by mouth daily.      Allergies  Allergen Reactions  . Aspirin Other (See Comments)    Hurts stomach  . Codeine Nausea And Vomiting  . Nitroglycerin Other (See Comments)    Heart rate drops   Follow-up Information    Grace Isaac, MD Follow up in 3 week(s).   Specialty:  Cardiothoracic Surgery Why:  left upper lobe lung mass Contact information: [address]  [place] [address] [phone]        Christinia Gully, MD Follow up in 2 week(s).   Specialty:  Pulmonary Disease Why:  left upper lobe lung nodule Contact information: [address] [place] [address] [phone]        Alex Plotnikov, MD Follow up in 1 week(s).   Specialty:  Internal Medicine Why:  hospital discharge follow up, repeat cbc/bmp at follow up, PET scan to be arranged by pmd. Contact information: [place] [address] [phone]            The results  of significant diagnostics from this hospitalization (including imaging, microbiology, ancillary and laboratory) are listed below for reference.    Significant Diagnostic Studies: Dg Chest 2 View  Result Date: 04/17/2016 CLINICAL DATA:  Productive cough for the past month. EXAM: CHEST  2 VIEW COMPARISON:  04/15/2016; 07/22/2015; chest CT - 04/15/2016 FINDINGS: Grossly unchanged cardiac silhouette and mediastinal contours post median sternotomy. Atherosclerotic plaque within the thoracic aorta. The lungs remain hyperexpanded  with flattening of the diaphragms and thinning of the biapical pulmonary parenchyma. Known ill-defined soft tissue about the left upper lobe surgical resection site is not well demonstrated on the present examination. No focal airspace opacities. No pleural effusion or pneumothorax. No evidence of edema. IMPRESSION: 1. Hyperexpanded lungs without acute cardiopulmonary disease. Emphysema. (ICD10-J43.9) 2. Previously identified soft tissue about the left upper lobe surgical resection site is suboptimally evaluated on the present examination. Correlation with report from chest CT performed 04/15/2016 is recommended. Electronically Signed   By: Sandi Mariscal M.D.   On: 04/17/2016 15:42   Dg Chest 2 View  Result Date: 04/15/2016 CLINICAL DATA:  Cough for 1 month EXAM: CHEST  2 VIEW COMPARISON:  04/03/2016 FINDINGS: There is hyperinflation of the lungs compatible with COPD. Prior CABG. Heart is normal size. No focal airspace opacities or effusions. No acute bony abnormality. IMPRESSION: COPD.  No active disease. Electronically Signed   By: Rolm Baptise M.D.   On: 04/15/2016 10:13   Dg Chest 2 View  Result Date: 04/03/2016 CLINICAL DATA:  Cough for 2 weeks. EXAM: CHEST  2 VIEW COMPARISON:  Chest CT 11/23/2015 FINDINGS: The heart size and mediastinal contours are within normal limits. Postsurgical changes from CABG is stable. Both lungs are clear. The previously seen 9 mm nodule in the left  lung by CT dated 11/23/2015 is not well seen radiographically. The visualized skeletal structures are unremarkable. IMPRESSION: No active cardiopulmonary disease. Electronically Signed   By: Fidela Salisbury M.D.   On: 04/03/2016 16:21   Ct Angio Chest Pe W And/or Wo Contrast  Result Date: 04/15/2016 CLINICAL DATA:  Elevated D-dimer.  Productive cough congestion. EXAM: CT ANGIOGRAPHY CHEST WITH CONTRAST TECHNIQUE: Multidetector CT imaging of the chest was performed using the standard protocol during bolus administration of intravenous contrast. Multiplanar CT image reconstructions and MIPs were obtained to evaluate the vascular anatomy. CONTRAST:  80 cc Isovue 370 IV COMPARISON:  11/23/2015 FINDINGS: Cardiovascular: No filling defects in the pulmonary arteries to suggest pulmonary emboli. Heart is normal size. Prior CABG. Aorta is tortuous with scattered calcifications, non aneurysmal. Mediastinum/Nodes: Mildly enlarged mediastinal lymph nodes. Subcarinal lymph node has a short axis diameter of 13 mm. Other small scattered paratracheal and AP window lymph nodes. No hilar or axillary adenopathy. Lungs/Pleura: Increasing density noted in the medial left upper lobe in the area of prior postoperative change. This soft tissue measures 2.2 x 1.7 cm on image 15. Given the location in the postoperative bed, this is concerning for tumor recurrence. Nodule in the left lower lobe on image 38 measures 8 mm on image 38, stable. Scarring noted in the lung bases. No pleural effusions. Upper Abdomen: Imaging into the upper abdomen shows no acute findings. Musculoskeletal: Chest wall soft tissues are unremarkable. No acute bony abnormality or focal bone lesion. Review of the MIP images confirms the above findings. IMPRESSION: Enlarging abnormal soft tissue in the left upper lobe at the prior surgical site concerning for recurrent tumor. This measures up to 2.2 cm. This could be further evaluated with PET CT. Stable left  lower lobe pulmonary nodule. Borderline and mildly enlarged mediastinal lymph nodes. Electronically Signed   By: Rolm Baptise M.D.   On: 04/15/2016 15:26    Microbiology: Recent Results (from the past 240 hour(s))  Respiratory Panel by PCR     Status: Abnormal   Collection Time: 04/15/16  8:52 PM  Result Value Ref Range Status   Adenovirus NOT DETECTED NOT DETECTED Final  Coronavirus 229E NOT DETECTED NOT DETECTED Final   Coronavirus HKU1 NOT DETECTED NOT DETECTED Final   Coronavirus NL63 NOT DETECTED NOT DETECTED Final   Coronavirus OC43 NOT DETECTED NOT DETECTED Final   Metapneumovirus DETECTED (A) NOT DETECTED Final   Rhinovirus / Enterovirus NOT DETECTED NOT DETECTED Final   Influenza A NOT DETECTED NOT DETECTED Final   Influenza B NOT DETECTED NOT DETECTED Final   Parainfluenza Virus 1 NOT DETECTED NOT DETECTED Final   Parainfluenza Virus 2 NOT DETECTED NOT DETECTED Final   Parainfluenza Virus 3 NOT DETECTED NOT DETECTED Final   Parainfluenza Virus 4 NOT DETECTED NOT DETECTED Final   Respiratory Syncytial Virus NOT DETECTED NOT DETECTED Final   Bordetella pertussis NOT DETECTED NOT DETECTED Final   Chlamydophila pneumoniae NOT DETECTED NOT DETECTED Final   Mycoplasma pneumoniae NOT DETECTED NOT DETECTED Final    Comment: Performed at [facility]  MRSA PCR Screening     Status: None   Collection Time: 04/16/16 10:25 AM  Result Value Ref Range Status   MRSA by PCR NEGATIVE NEGATIVE Final    Comment:        The GeneXpert MRSA Assay (FDA approved for NASAL specimens only), is one component of a comprehensive MRSA colonization surveillance program. It is not intended to diagnose MRSA infection nor to guide or monitor treatment for MRSA infections.   Culture, expectorated sputum-assessment     Status: None   Collection Time: 04/16/16  6:18 PM  Result Value Ref Range Status   Specimen Description SPUTUM  Final   Special Requests NONE  Final   Sputum evaluation  THIS SPECIMEN IS ACCEPTABLE FOR SPUTUM CULTURE  Final   Report Status 04/16/2016 FINAL  Final  Culture, respiratory (NON-Expectorated)     Status: None (Preliminary result)   Collection Time: 04/16/16  6:18 PM  Result Value Ref Range Status   Specimen Description SPUTUM  Final   Special Requests NONE  Final   Gram Stain   Final    DEGENERATED CELLULAR MATERIAL PRESENT ABUNDANT GRAM NEGATIVE COCCOBACILLI FEW GRAM POSITIVE COCCI IN PAIRS IN CLUSTERS RARE GRAM POSITIVE RODS Performed at [facility]    Culture PENDING  Incomplete   Report Status PENDING  Incomplete     Labs: Basic Metabolic Panel:  Recent Labs Lab 04/15/16 1300 04/16/16 0553 04/17/16 0531 04/18/16 0513  NA 137 136 140 142  K 4.1 4.5 3.9 4.1  CL 100* 102 103 108  CO2 24 25 27 26   GLUCOSE 102* 156* 87 89  BUN 20 21* 26* 26*  CREATININE 1.31* 1.19* 1.27* 1.22*  CALCIUM 9.5 9.5 9.6 9.2   Liver Function Tests:  Recent Labs Lab 04/17/16 0531  AST 26  ALT 15  ALKPHOS 111  BILITOT 0.6  PROT 7.0  ALBUMIN 3.6   No results for input(s): LIPASE, AMYLASE in the last 168 hours. No results for input(s): AMMONIA in the last 168 hours. CBC:  Recent Labs Lab 04/15/16 1200 04/16/16 0553 04/17/16 0531 04/18/16 0513  WBC 8.5 5.4 13.6* 12.0*  NEUTROABS 5.6  --  8.1* 7.9*  HGB 13.0 12.3 12.3 11.8*  HCT 38.1 36.9 37.3 35.5*  MCV 90.9 90.4 90.5 91.0  PLT 642* 589* 569* 502*   Cardiac Enzymes: No results for input(s): CKTOTAL, CKMB, CKMBINDEX, TROPONINI in the last 168 hours. BNP: BNP (last 3 results) No results for input(s): BNP in the last 8760 hours.  ProBNP (last 3 results) No results for input(s): PROBNP in the  last 8760 hours.  CBG: No results for input(s): GLUCAP in the last 168 hours.     SignedFlorencia Reasons MD, PhD  [facility] 04/18/2016, 10:58 AM

## 2016-04-18 NOTE — Progress Notes (Signed)
Date: April 18, 2016 Discharge orders checked for needs. No case management needs present at time of discharge. Rhonda Davis, RN, BSN, CCM   [phone] 

## 2016-04-19 ENCOUNTER — Ambulatory Visit (INDEPENDENT_AMBULATORY_CARE_PROVIDER_SITE_OTHER): Payer: Medicare Other | Admitting: Internal Medicine

## 2016-04-19 ENCOUNTER — Encounter: Payer: Self-pay | Admitting: Internal Medicine

## 2016-04-19 DIAGNOSIS — J9601 Acute respiratory failure with hypoxia: Secondary | ICD-10-CM

## 2016-04-19 DIAGNOSIS — I10 Essential (primary) hypertension: Secondary | ICD-10-CM | POA: Diagnosis not present

## 2016-04-19 DIAGNOSIS — R0602 Shortness of breath: Secondary | ICD-10-CM | POA: Diagnosis not present

## 2016-04-19 DIAGNOSIS — R918 Other nonspecific abnormal finding of lung field: Secondary | ICD-10-CM | POA: Diagnosis not present

## 2016-04-19 LAB — CULTURE, RESPIRATORY W GRAM STAIN

## 2016-04-19 LAB — CULTURE, RESPIRATORY

## 2016-04-19 NOTE — Progress Notes (Signed)
Pre visit review using our clinic review tool, if applicable. No additional management support is needed unless otherwise documented below in the visit note. 

## 2016-04-20 MED ORDER — METHYLPREDNISOLONE ACETATE 80 MG/ML IJ SUSP
80.0000 mg | Freq: Once | INTRAMUSCULAR | Status: AC
  Administered 2016-04-19: 80 mg via INTRAMUSCULAR

## 2016-04-20 NOTE — Assessment & Plan Note (Signed)
Improving Start Anoro Depo-medrol IM Finish abx

## 2016-04-20 NOTE — Assessment & Plan Note (Signed)
LUL mass PET scan is pending Pulm and thor surgery consult/f/u

## 2016-04-20 NOTE — Assessment & Plan Note (Signed)
Losartan, Metoprolol

## 2016-04-20 NOTE — Progress Notes (Signed)
Subjective:  Patient ID: [name], female    DOB: 1941-02-23  Age: 75 y.o. MRN: [id number]  CC: No chief complaint on file.   HPI [name] presents for a post-hospital visit. She was admitted for pneumonia and treated for ARF. She was found to have a lung mass. I reviewed the chart/tests/notes. C/o cough and SOB.  Outpatient Medications Prior to Visit  Medication Sig Dispense Refill  . acetaminophen (TYLENOL) 500 MG tablet Take 500 mg by mouth every 6 (six) hours as needed for mild pain.    [name] albuterol (PROVENTIL HFA;VENTOLIN HFA) 108 (90 Base) MCG/ACT inhaler Inhale 2 puffs into the lungs every 6 (six) hours as needed for wheezing or shortness of breath. 1 Inhaler 0  . atorvastatin (LIPITOR) 80 MG tablet Take 1 tablet by mouth  daily 90 tablet 3  . benzonatate (TESSALON) 100 MG capsule Take 1 capsule (100 mg total) by mouth 3 (three) times daily as needed. 20 capsule 0  . BIOTIN PO Take 1 each by mouth daily.    . cetirizine (ZYRTEC) 10 MG tablet Take 10 mg by mouth daily.    . clopidogrel (PLAVIX) 75 MG tablet Take 1 tablet by mouth  daily 90 tablet 3  . doxycycline (VIBRA-TABS) 100 MG tablet Take 1 tablet (100 mg total) by mouth 2 (two) times daily. 10 tablet 0  . famotidine (PEPCID) 10 MG tablet Take 1 tablet (10 mg total) by mouth at bedtime. 30 tablet 0  . feeding supplement, ENSURE ENLIVE, (ENSURE ENLIVE) LIQD Take 237 mLs by mouth 2 (two) times daily between meals. 237 mL 12  . fluticasone (FLONASE) 50 MCG/ACT nasal spray Place 2 sprays into both nostrils daily. 16 g 0  . guaiFENesin (MUCINEX) 600 MG 12 hr tablet Take 1 tablet (600 mg total) by mouth 2 (two) times daily. 30 tablet 0  . leflunomide (ARAVA) 20 MG tablet Take 1 tablet (20 mg total) by mouth every other day. 15 tablet 6  . levothyroxine (SYNTHROID, LEVOTHROID) 88 MCG tablet Take 1 tablet by mouth  daily 90 tablet 3  . loperamide (IMODIUM A-D) 2 MG tablet Take 2 mg by mouth 4 (four) times daily as needed for  diarrhea or loose stools.    [name] losartan (COZAAR) 25 MG tablet Take 1 tablet by mouth  daily 90 tablet 3  . metoprolol succinate (TOPROL-XL) 25 MG 24 hr tablet Take 1 tablet by mouth  daily 90 tablet 3  . Probiotic Product (ALIGN) 4 MG CAPS Take 1 capsule by mouth daily. 30 capsule 1  . psyllium (METAMUCIL) 58.6 % packet Take 1 packet by mouth daily.     No facility-administered medications prior to visit.     ROS Review of Systems  Constitutional: Negative for activity change, appetite change, chills, fatigue and unexpected weight change.  HENT: Negative for congestion, mouth sores and sinus pressure.   Eyes: Negative for visual disturbance.  Respiratory: Positive for cough, shortness of breath and wheezing. Negative for chest tightness.   Gastrointestinal: Negative for abdominal pain and nausea.  Genitourinary: Negative for difficulty urinating, frequency and vaginal pain.  Musculoskeletal: Negative for back pain and gait problem.  Skin: Negative for pallor and rash.  Neurological: Negative for dizziness, tremors, weakness, numbness and headaches.  Psychiatric/Behavioral: Negative for confusion and sleep disturbance.    Objective:  BP 110/80   Pulse (!) 104   Temp 98.1 F (36.7 C) (Oral)   Wt 152 lb (68.9 kg)   SpO2  90%   BMI 26.93 kg/m   BP Readings from Last 3 Encounters:  04/19/16 110/80  04/18/16 (!) 187/92  04/15/16 102/80    Wt Readings from Last 3 Encounters:  04/19/16 152 lb (68.9 kg)  04/15/16 149 lb 12.8 oz (67.9 kg)  04/15/16 152 lb (68.9 kg)    Physical Exam  Constitutional: She appears well-developed. No distress.  HENT:  Head: Normocephalic.  Right Ear: External ear normal.  Left Ear: External ear normal.  Nose: Nose normal.  Mouth/Throat: Oropharynx is clear and moist.  Eyes: Conjunctivae are normal. Pupils are equal, round, and reactive to light. Right eye exhibits no discharge. Left eye exhibits no discharge.  Neck: Normal range of motion. Neck  supple. No JVD present. No tracheal deviation present. No thyromegaly present.  Cardiovascular: Normal rate, regular rhythm and normal heart sounds.   Pulmonary/Chest: No stridor. No respiratory distress. She has wheezes.  Abdominal: Soft. Bowel sounds are normal. She exhibits no distension and no mass. There is no tenderness. There is no rebound and no guarding.  Musculoskeletal: She exhibits no edema or tenderness.  Lymphadenopathy:    She has no cervical adenopathy.  Neurological: She displays normal reflexes. No cranial nerve deficit. She exhibits normal muscle tone. Coordination normal.  Skin: No rash noted. No erythema.  Psychiatric: She has a normal mood and affect. Her behavior is normal. Judgment and thought content normal.   I personally provided Anoro inhaler use teaching. After the teaching patient was able to demonstrate it's use effectively. All questions were answered  Lab Results  Component Value Date   WBC 12.0 (H) 04/18/2016   HGB 11.8 (L) 04/18/2016   HCT 35.5 (L) 04/18/2016   PLT 502 (H) 04/18/2016   GLUCOSE 89 04/18/2016   CHOL 178 12/23/2015   TRIG 86.0 12/23/2015   HDL 87.70 12/23/2015   LDLDIRECT 162.1 10/28/2009   LDLCALC 73 12/23/2015   ALT 15 04/17/2016   AST 26 04/17/2016   NA 142 04/18/2016   K 4.1 04/18/2016   CL 108 04/18/2016   CREATININE 1.22 (H) 04/18/2016   BUN 26 (H) 04/18/2016   CO2 26 04/18/2016   TSH 1.65 12/23/2015   INR 0.96 04/15/2016    Dg Chest 2 View  Result Date: 04/15/2016 CLINICAL DATA:  Cough for 1 month EXAM: CHEST  2 VIEW COMPARISON:  04/03/2016 FINDINGS: There is hyperinflation of the lungs compatible with COPD. Prior CABG. Heart is normal size. No focal airspace opacities or effusions. No acute bony abnormality. IMPRESSION: COPD.  No active disease. Electronically Signed   By: Rolm Baptise M.D.   On: 04/15/2016 10:13   Ct Angio Chest Pe W And/or Wo Contrast  Result Date: 04/15/2016 CLINICAL DATA:  Elevated D-dimer.   Productive cough congestion. EXAM: CT ANGIOGRAPHY CHEST WITH CONTRAST TECHNIQUE: Multidetector CT imaging of the chest was performed using the standard protocol during bolus administration of intravenous contrast. Multiplanar CT image reconstructions and MIPs were obtained to evaluate the vascular anatomy. CONTRAST:  80 cc Isovue 370 IV COMPARISON:  11/23/2015 FINDINGS: Cardiovascular: No filling defects in the pulmonary arteries to suggest pulmonary emboli. Heart is normal size. Prior CABG. Aorta is tortuous with scattered calcifications, non aneurysmal. Mediastinum/Nodes: Mildly enlarged mediastinal lymph nodes. Subcarinal lymph node has a short axis diameter of 13 mm. Other small scattered paratracheal and AP window lymph nodes. No hilar or axillary adenopathy. Lungs/Pleura: Increasing density noted in the medial left upper lobe in the area of prior postoperative change. This soft tissue  measures 2.2 x 1.7 cm on image 15. Given the location in the postoperative bed, this is concerning for tumor recurrence. Nodule in the left lower lobe on image 38 measures 8 mm on image 38, stable. Scarring noted in the lung bases. No pleural effusions. Upper Abdomen: Imaging into the upper abdomen shows no acute findings. Musculoskeletal: Chest wall soft tissues are unremarkable. No acute bony abnormality or focal bone lesion. Review of the MIP images confirms the above findings. IMPRESSION: Enlarging abnormal soft tissue in the left upper lobe at the prior surgical site concerning for recurrent tumor. This measures up to 2.2 cm. This could be further evaluated with PET CT. Stable left lower lobe pulmonary nodule. Borderline and mildly enlarged mediastinal lymph nodes. Electronically Signed   By: Rolm Baptise M.D.   On: 04/15/2016 15:26    Assessment & Plan:   There are no diagnoses linked to this encounter. I am having Ms. Cake maintain her psyllium, leflunomide, ALIGN, loperamide, acetaminophen, levothyroxine,  losartan, clopidogrel, metoprolol succinate, BIOTIN PO, atorvastatin, benzonatate, albuterol, cetirizine, doxycycline, famotidine, feeding supplement (ENSURE ENLIVE), fluticasone, and guaiFENesin.  No orders of the defined types were placed in this encounter.    Follow-up: No Follow-up on file.  [name], MD

## 2016-04-25 LAB — HEPATIC FUNCTION PANEL
ALBUMIN: 3.9 g/dL (ref 3.6–5.1)
ALK PHOS: 100 U/L (ref 33–130)
ALT: 11 U/L (ref 6–29)
AST: 17 U/L (ref 10–35)
BILIRUBIN TOTAL: 0.6 mg/dL (ref 0.2–1.2)
Bilirubin, Direct: 0.2 mg/dL (ref <=0.2)
Indirect Bilirubin: 0.4 mg/dL (ref 0.2–1.2)
TOTAL PROTEIN: 6.6 g/dL (ref 6.1–8.1)

## 2016-04-27 ENCOUNTER — Ambulatory Visit: Payer: Medicare Other | Admitting: Internal Medicine

## 2016-04-27 ENCOUNTER — Other Ambulatory Visit: Payer: Self-pay | Admitting: Cardiothoracic Surgery

## 2016-04-27 DIAGNOSIS — R911 Solitary pulmonary nodule: Secondary | ICD-10-CM

## 2016-04-27 LAB — CARDIO IQ(R) ADVANCED LIPID PANEL
Apolipoprotein B: 62 mg/dL (ref 49–103)
CHOLESTEROL, TOTAL (CARDIO IQ ADV LIPID PANEL): 146 mg/dL (ref <200)
CHOLESTEROL/HDL RATIO (CARDIO IQ ADV LIPID PANEL): 2.4 calc (ref <5.0)
HDL Cholesterol: 60 mg/dL (ref >50)
LDL CHOLESTEROL CALCULATED (CARDIO IQ ADV LIPID PANEL): 68 mg/dL (ref <100)
LDL Large: 6160 nmol/L (ref 5038–17886)
LDL Medium: 195 nmol/L (ref 121–397)
LDL PARTICLE NUMBER: 938 nmol/L — AB (ref 1016–2185)
LDL PEAK SIZE: 219.3 Angstrom (ref >=218.2)
LDL Small: 163 nmol/L (ref 115–386)
Lipoprotein (a): 56 nmol/L (ref <75)
NON-HDL CHOLESTEROL (CARDIO IQ ADV LIPID PANEL): 86 mg/dL (ref <130)
TRIGLYCERIDES (CARDIO IQ ADV LIPID PANEL): 93 mg/dL (ref <150)

## 2016-05-02 ENCOUNTER — Encounter (HOSPITAL_COMMUNITY): Payer: Medicare Other

## 2016-05-03 ENCOUNTER — Encounter: Payer: Medicare Other | Admitting: Cardiothoracic Surgery

## 2016-05-04 ENCOUNTER — Ambulatory Visit (INDEPENDENT_AMBULATORY_CARE_PROVIDER_SITE_OTHER): Payer: Medicare Other | Admitting: Cardiothoracic Surgery

## 2016-05-04 ENCOUNTER — Encounter: Payer: Self-pay | Admitting: Cardiothoracic Surgery

## 2016-05-04 VITALS — BP 140/83 | HR 92 | Resp 16 | Ht 63.0 in | Wt 145.0 lb

## 2016-05-04 DIAGNOSIS — J8410 Pulmonary fibrosis, unspecified: Secondary | ICD-10-CM | POA: Diagnosis not present

## 2016-05-04 DIAGNOSIS — R911 Solitary pulmonary nodule: Secondary | ICD-10-CM

## 2016-05-04 NOTE — Progress Notes (Signed)
[address]       [place] [address]             [phone]      Evaleigh E Takemoto [facility] Medical Record #[id number] Date of Birth: Dec 09, 1940  Referring: Tanda Rockers, MD Primary Care: [name], MD  Chief Complaint:   POST OP FOLLOW UP 03/02/2015 OPERATIVE REPORT PREOPERATIVE DIAGNOSIS: Left upper lobe lung mass POSTOPERATIVE DIAGNOSIS: Left upper lobe lung mass. 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 PROCEDURE PERFORMED: Video bronchoscopy, left video-assisted thoracoscopy, minithoracotomy, wedge resection of left upper lobe. SURGEON: Lanelle Bal, M.D Previous left VATS lung resection Lung, wedge biopsy/resection, left upper lobe - NECROTIZING GRANULOMA, 1.7 CM. - NO TUMOR SEEN. - SEE COMMENT. Microscopic Comment Sections demonstrate a 1.7 cm necrotizing granuloma. There is associated reactive pneumocyte hyperplasia with reactive atypia but no true tumor is identified. An AFB stain is performed on a representative section which is negative for acid fast bacilli. A GMS and PAS stain are also performed on a representative section which are both negative for fungal organisms. Although specimen stains for microorganisms are negative, stains are not as sensitive as other microbiologic techniques including culture. Please correlate with culture results. (RAH:gt, 03/04/15) [name] Pathologist, Electronic Signature (Case signed 03/04/2015  History of Present Illness:     Patient was recently admitted to [facility] with respiratory problems admitted mid-December r,  the report notes recurrent malignancy and old stable line, however the patient has no previous history of lung cancer nor lobectomy.  Repeat ct of chest done with enlarging are left upper lobe pleural based, not at area of previous wedge resection (patient did not have lobectomy as some notes indicate, this was interpreted as recurrent malignancy however the  patient previous surgery was not related to malignancy, she has no previous history of lung cancer. Since discharge from hospital she has improved and with decreased cough but still complains of some breathlessness.  She did smoke for 5-7 years but quit at age 80. With her limited smoking history she would not be eligible for lung cancer screening CT program. Past Medical History:  Diagnosis Date  . Abdominal aortic aneurysm (HCC)    REPAIRED IN 1993 BY [name]  AND HAS RECENTLY BEEN FOLLOWED BY [name]  . 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 Adenomatous colon polyp    tubular  . Anemia   . CAD in native artery 1996, 2002, 2005    Status post CABG x1 with LIMA-LAD for ostial LAD 90% stenosis --> down to 50% in 2002 and 30% in 2005.;  Atretic LIMA; Myoview 06/2010: Fixed anteroseptal, apical and inferoapical defect with moderate size. Most likely scar. Mild subendocardial ischemia. EF 71% LOW RISK.   [name] Cervical disc disease   . Chronic kidney disease   . COPD (chronic obstructive pulmonary disease) ([place])   . Diverticulosis   . Hyperlipidemia   . Hypertension   . Hypothyroidism (acquired)    hypo  . Myocardial infarction Aug. 2016   TIA  . Polymyalgia rheumatica (HCC)    2011 Dr. Charlestine Night  . Rheumatoid arthritis ([place]) 2011   Dr.Truslow; fracture knees, hands and wrists -   . S/P CABG x 1 1996   CABG--LIMA-LAD for ostial LAD (not felt to be PCI amenable). EF NORMAL then; LIMA now atretic  . Shortness of breath dyspnea  with exertion  . Stroke [facility]) 11-2014   TIA   . Urinary frequency      History  Smoking Status  . Former Smoker  . Quit date: 12/04/1958  Smokeless Tobacco  . Never Used    History  Alcohol Use No     Allergies  Allergen Reactions  . Aspirin Other (See Comments)    Hurts stomach  . Codeine Nausea And Vomiting  . Nitroglycerin Other (See Comments)    Heart rate drops     Current Outpatient Prescriptions  Medication Sig Dispense Refill  . acetaminophen (TYLENOL) 500 MG tablet Take 500 mg by mouth every 6 (six) hours as needed for mild pain.    [name] albuterol (PROVENTIL HFA;VENTOLIN HFA) 108 (90 Base) MCG/ACT inhaler Inhale 2 puffs into the lungs every 6 (six) hours as needed for wheezing or shortness of breath. 1 Inhaler 0  . atorvastatin (LIPITOR) 80 MG tablet Take 1 tablet by mouth  daily 90 tablet 3  . BIOTIN PO Take 1 each by mouth daily.    . cetirizine (ZYRTEC) 10 MG tablet Take 10 mg by mouth daily.    . clopidogrel (PLAVIX) 75 MG tablet Take 1 tablet by mouth  daily 90 tablet 3  . famotidine (PEPCID) 10 MG tablet Take 1 tablet (10 mg total) by mouth at bedtime. 30 tablet 0  . fluticasone (FLONASE) 50 MCG/ACT nasal spray Place 2 sprays into both nostrils daily. 16 g 0  . leflunomide (ARAVA) 20 MG tablet Take 1 tablet (20 mg total) by mouth every other day. 15 tablet 6  . levothyroxine (SYNTHROID, LEVOTHROID) 88 MCG tablet Take 1 tablet by mouth  daily 90 tablet 3  . loperamide (IMODIUM A-D) 2 MG tablet Take 2 mg by mouth 4 (four) times daily as needed for diarrhea or loose stools.    [name] losartan (COZAAR) 25 MG tablet Take 1 tablet by mouth  daily 90 tablet 3  . metoprolol succinate (TOPROL-XL) 25 MG 24 hr tablet Take 1 tablet by mouth  daily 90 tablet 3  . Probiotic Product (ALIGN) 4 MG CAPS Take 1 capsule by mouth daily. 30 capsule 1  . psyllium (METAMUCIL) 58.6 % packet Take 1 packet by mouth daily.     No current facility-administered medications for this visit.        Physical Exam: BP 140/83 (BP Location: Left Arm, Patient Position: Sitting, Cuff Size: Normal)   Pulse 92   Resp 16   Ht 5\' 3"  (1.6 m)   Wt 145 lb (65.8 kg)   SpO2 98% Comment: ON RA  BMI 25.69 kg/m   General appearance: alert, cooperative, appears stated age and no distress Neurologic: intact Heart: regular rate and rhythm, S1, S2 normal, no murmur, click, rub or  gallop Lungs: clear to auscultation bilaterally Abdomen: soft, non-tender; bowel sounds normal; no masses,  no organomegaly Extremities: extremities normal, atraumatic, no cyanosis or edema and Homans sign is negative, no sign of DVT Wound: Her left chest incision and chest tube sites are all well-healed No cervical  Nodes noted   Diagnostic Studies & Laboratory data:     Recent Radiology Findings:   Dg Chest 2 View  Result Date: 04/17/2016 CLINICAL DATA:  Productive cough for the past month. EXAM: CHEST  2 VIEW COMPARISON:  04/15/2016; 07/22/2015; chest CT - 04/15/2016 FINDINGS: Grossly unchanged cardiac silhouette and mediastinal contours post median sternotomy. Atherosclerotic plaque within the thoracic aorta. The lungs remain hyperexpanded with flattening of the diaphragms and  thinning of the biapical pulmonary parenchyma. Known ill-defined soft tissue about the left upper lobe surgical resection site is not well demonstrated on the present examination. No focal airspace opacities. No pleural effusion or pneumothorax. No evidence of edema. IMPRESSION: 1. Hyperexpanded lungs without acute cardiopulmonary disease. Emphysema. (ICD10-J43.9) 2. Previously identified soft tissue about the left upper lobe surgical resection site is suboptimally evaluated on the present examination. Correlation with report from chest CT performed 04/15/2016 is recommended. Electronically Signed   By: Sandi Mariscal M.D.   On: 04/17/2016 15:42     Ct Angio Chest Pe W And/or Wo Contrast  Result Date: 04/15/2016 CLINICAL DATA:  Elevated D-dimer.  Productive cough congestion. EXAM: CT ANGIOGRAPHY CHEST WITH CONTRAST TECHNIQUE: Multidetector CT imaging of the chest was performed using the standard protocol during bolus administration of intravenous contrast. Multiplanar CT image reconstructions and MIPs were obtained to evaluate the vascular anatomy. CONTRAST:  80 cc Isovue 370 IV COMPARISON:  11/23/2015 FINDINGS:  Cardiovascular: No filling defects in the pulmonary arteries to suggest pulmonary emboli. Heart is normal size. Prior CABG. Aorta is tortuous with scattered calcifications, non aneurysmal. Mediastinum/Nodes: Mildly enlarged mediastinal lymph nodes. Subcarinal lymph node has a short axis diameter of 13 mm. Other small scattered paratracheal and AP window lymph nodes. No hilar or axillary adenopathy. Lungs/Pleura: Increasing density noted in the medial left upper lobe in the area of prior postoperative change. This soft tissue measures 2.2 x 1.7 cm on image 15. Given the location in the postoperative bed, this is concerning for tumor recurrence. Nodule in the left lower lobe on image 38 measures 8 mm on image 38, stable. Scarring noted in the lung bases. No pleural effusions. Upper Abdomen: Imaging into the upper abdomen shows no acute findings. Musculoskeletal: Chest wall soft tissues are unremarkable. No acute bony abnormality or focal bone lesion. Review of the MIP images confirms the above findings. IMPRESSION: Enlarging abnormal soft tissue in the left upper lobe at the prior surgical site concerning for recurrent tumor. This measures up to 2.2 cm. This could be further evaluated with PET CT. Stable left lower lobe pulmonary nodule. Borderline and mildly enlarged mediastinal lymph nodes. Electronically Signed   By: Rolm Baptise M.D.   On: 04/15/2016 15:26    Ct Chest Wo Contrast  Result Date: 11/23/2015 CLINICAL DATA:  Followup left lung nodule. Previous wedge resection revealed necrotizing granuloma. EXAM: CT CHEST WITHOUT CONTRAST TECHNIQUE: Multidetector CT imaging of the chest was performed following the standard protocol without IV contrast. COMPARISON:  01/29/2015 FINDINGS: Cardiovascular: Heart size within normal limits. Aortic atherosclerosis noted. Previous CABG. Mediastinum/Lymph Nodes: No masses or pathologically enlarged lymph nodes identified on this un-enhanced exam. Lungs/Pleura: Postop changes  seen in the medial left upper lobe from previous wedge resection. 8 mm mean diameter perifissural nodule nodule is seen in the left midlung on image 66/4. This is new since previous study, but likely represents an intrapulmonary lymph node. Mild centrilobular emphysema noted. Bibasilar scarring and tiny sub-cm posterior right lower lobe pulmonary nodule remains stable. No evidence of acute infiltrate or pleural effusion. Upper abdomen: No acute findings. Musculoskeletal: No chest wall mass or suspicious bone lesions identified. IMPRESSION: Postop changes in left upper lobe. 8 mm mean diameter perifissural nodule in the left midlung is new, but likely represents an intrapulmonary lymph node. Non-contrast chest CT at 6-12 months is recommended. If the nodule is stable at time of repeat CT, then future CT at 18-24 months (from today's scan) is considered optional  for low-risk patients, but is recommended for high-risk patients. This recommendation follows the consensus statement: Guidelines for Management of Incidental Pulmonary Nodules Detected on CT Images:From the Fleischner Society 2017; published online before print (10.1148/radiol.3295188416). Aortic atherosclerosis and emphysema. Electronically Signed   By: Earle Gell M.D.   On: 11/23/2015 15:46    Recent Lab Findings: Lab Results  Component Value Date   WBC 12.0 (H) 04/18/2016   HGB 11.8 (L) 04/18/2016   HCT 35.5 (L) 04/18/2016   PLT 502 (H) 04/18/2016   GLUCOSE 89 04/18/2016   CHOL 146 04/25/2016   TRIG 93 04/25/2016   HDL 60 04/25/2016   LDLDIRECT 162.1 10/28/2009   LDLCALC 68 04/25/2016   ALT 11 04/25/2016   AST 17 04/25/2016   NA 142 04/18/2016   K 4.1 04/18/2016   CL 108 04/18/2016   CREATININE 1.22 (H) 04/18/2016   BUN 26 (H) 04/18/2016   CO2 26 04/18/2016   TSH 1.65 12/23/2015   INR 0.96 04/15/2016   10/2015   03/2016   PFT's 10 /2016 FEV1 1.58 80% DLCO 16.27 71%  Interpretation: The FEV1 is normal, but the  FEV1/FVC ratio and FEF25-75% are reduced. While the TLC, FRC and SVC are within normal limits, the RV is increased. Following administration of bronchodilators, there is a slight response. The reduced diffusing capacity indicates a mild loss of functional alveolar capillary surface. However, the diffusing capacity was not corrected for the patient's hemoglobin. 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 So far on cultures and smears no specific organisms have been identified. Follow up ct Postop changes in left upper lobe. 8 mm mean diameter perifissural nodule in the left midlung is new, but likely represents an intrapulmonary lymph node. A PET scan was recommended by radiology following the CT in December 2017, however with the the patient's acute respiratory symptoms I recommended to the hospital so we see her as an outpatient before proceeding with it scan. On review of the films are recommended to the patient that we obtain a short interval CT scan of the chest in early February to compare to the one done in December 2 attempted delineate what may be inflammatory in what may be true changes, depending on the results of this scan we can consider PET scan if appropriate. I plan to see her back in early February with a CT scan   Grace Isaac MD      [address] [place] [address] Office [phone]   Beeper [phone]  05/04/2016 12:09 PM

## 2016-05-18 ENCOUNTER — Ambulatory Visit: Payer: Medicare Other | Admitting: Internal Medicine

## 2016-05-22 ENCOUNTER — Other Ambulatory Visit (INDEPENDENT_AMBULATORY_CARE_PROVIDER_SITE_OTHER): Payer: Medicare Other

## 2016-05-22 ENCOUNTER — Encounter: Payer: Self-pay | Admitting: Internal Medicine

## 2016-05-22 ENCOUNTER — Ambulatory Visit (INDEPENDENT_AMBULATORY_CARE_PROVIDER_SITE_OTHER): Payer: Medicare Other | Admitting: Internal Medicine

## 2016-05-22 VITALS — BP 108/68 | HR 100 | Ht 63.0 in | Wt 148.0 lb

## 2016-05-22 DIAGNOSIS — R05 Cough: Secondary | ICD-10-CM

## 2016-05-22 DIAGNOSIS — J449 Chronic obstructive pulmonary disease, unspecified: Secondary | ICD-10-CM | POA: Diagnosis not present

## 2016-05-22 DIAGNOSIS — R058 Other specified cough: Secondary | ICD-10-CM

## 2016-05-22 DIAGNOSIS — R911 Solitary pulmonary nodule: Secondary | ICD-10-CM | POA: Diagnosis not present

## 2016-05-22 LAB — CBC WITH DIFFERENTIAL/PLATELET
BASOS PCT: 1 % (ref 0.0–3.0)
Basophils Absolute: 0.1 10*3/uL (ref 0.0–0.1)
EOS ABS: 0.3 10*3/uL (ref 0.0–0.7)
EOS PCT: 3.2 % (ref 0.0–5.0)
HCT: 40 % (ref 36.0–46.0)
Hemoglobin: 13.1 g/dL (ref 12.0–15.0)
LYMPHS ABS: 2 10*3/uL (ref 0.7–4.0)
Lymphocytes Relative: 22.5 % (ref 12.0–46.0)
MCHC: 32.7 g/dL (ref 30.0–36.0)
MCV: 93.3 fl (ref 78.0–100.0)
MONO ABS: 0.9 10*3/uL (ref 0.1–1.0)
Monocytes Relative: 10.6 % (ref 3.0–12.0)
NEUTROS PCT: 62.7 % (ref 43.0–77.0)
Neutro Abs: 5.6 10*3/uL (ref 1.4–7.7)
Platelets: 296 10*3/uL (ref 150.0–400.0)
RBC: 4.29 Mil/uL (ref 3.87–5.11)
RDW: 17.3 % — AB (ref 11.5–15.5)
WBC: 8.9 10*3/uL (ref 4.0–10.5)

## 2016-05-22 MED ORDER — FAMOTIDINE 20 MG PO TABS: ORAL_TABLET | ORAL | Status: DC

## 2016-05-22 MED ORDER — MONTELUKAST SODIUM 10 MG PO TABS: 10.0000 mg | ORAL_TABLET | Freq: Every day | ORAL | 11 refills | Status: DC

## 2016-05-22 MED ORDER — PANTOPRAZOLE SODIUM 40 MG PO TBEC: 40.0000 mg | DELAYED_RELEASE_TABLET | Freq: Every day | ORAL | 2 refills | Status: DC

## 2016-05-22 MED ORDER — PREDNISONE 10 MG PO TABS: ORAL_TABLET | ORAL | 0 refills | Status: DC

## 2016-05-22 MED ORDER — FAMOTIDINE 20 MG PO TABS: ORAL_TABLET | ORAL | 2 refills | Status: DC

## 2016-05-22 NOTE — Progress Notes (Signed)
Subjective:    Patient ID: [name], female    DOB: [birth year],    MRN: [id number]     Brief patient profile:  29   yowf smoker age 76- 44 s obvious sequelae with new cough while on a cruise in September 2016 in [place] > cxr > ct chest > PET > referred to pulmonary clinic 02/15/2015 by Dr  Philis Fendt [facility] with GOLD I copd 02/19/15     History of Present Illness  02/15/2015 1st [facility] office visit/ Marlys Stegmaier   Chief Complaint  Patient presents with  . PULMONARY CONSULT    Pulmonary nodules. Pt reports some dyspnea with exertion, some morning dry to wet cough. Pt does not use any inhalers.   Cough better but some cc am congestion x years ? Worse in winter > total ? Usually swallows so can't say color or amt assoc with mild pnds  Doe only with exertion = MMRC1 = can walk nl pace, flat grade, can't hurry or go uphills or steps s mild sob  rec Please remember to go to the lab  department downstairs for your tests - we will call you with the results when they are available. 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 F/u with PMD within a week  for hospital discharge follow up, repeat cbc/bmp at follow up, pmd to arrange pet scan for lung mass 2. F/u with pulmonology  3. F/u with thoracic surgery.  Discharge Diagnoses:        Active Hospital Problems   Diagnosis Date Noted  . Acute respiratory failure with hypoxia ([facility]) 04/15/2016  . Acute respiratory failure with hypoxemia ([facility]) 04/16/2016  . Essential hypertension 12/08/2006  . Chronic kidney disease  12/08/2006  . 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) 12/22/1994    Class: History of  . 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; now atretic and significantly improved LAD lesion without intervention 12/22/1994    Class: History of    Resolved Hospital Problems   Diagnosis Date Noted Date Resolved  No resolved problems to display.    Discharge Condition: stable  Diet recommendation: heart healthy      Filed Weights   04/15/16 1153 04/15/16 1900  Weight: 68 kg (150 lb) 67.9 kg (149 lb 12.8 oz)    History of present illness:  Chief Complaint: Coughing  HPI: [name] a 76 y.o.femalewith medical history significant of COPD, CAD s/p CABG x1, PVD, CVA, AAA s/p repair. Symptoms started about a month ago with a nonproductive cough. Symptoms remain persistent and she went to primary care physician about 2 weeks ago. She is prescribed antibiotics at that time which helped for about 1 day, but symptoms continue to persist. She eventually came to the emergency department because her symptoms continuedto worsen. She reports no aggravating symptoms. He has mild dyspnea at rest.  ED Course: Vitals: Afebrile. Slight tachycardia to 100s. Slightly hypertensive. On 2L O2 via [place] Labs: creatinine 1.31.  Imaging: CT shows abnormal enlarging soft tissue in left  upper lobe Medications/Course: Given albuterol, atrovent and solu-medrol.  Hospital Course:  Principal Problem:   Acute respiratory failure with hypoxia [facility]) Active Problems:   Essential hypertension   Chronic kidney disease   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)   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; now atretic and significantly improved LAD lesion without intervention   Acute respiratory failure with hypoxemia (HCC)   Acute respiratory failure with hypoxia with cough, failed outpatient treatment Patient report she started  have cough 4weeks ago, she was seen by primary care doctor on 12/4 and on 12/16, she was treated with oral abx cedinir, however her symptom did not improve, she is sent by pmd to the hospital due to persistent symptom and she started to have tachycardia and reduced oxygen level She presented to the hospital, initially her oxygen saturation was in the 80's, she is tachycardia in the 100's, tachypnea , her respiration rate stayed around 28 in the ED, at one point her respiration rate in the Ed got up to 45. She has mild lactic acidosis on presentation as well Initial CTA no pneumonia, no PE,  Viral panel + metapneumovirus. - supportive care and wean oxygen as able, Albuterol prn -she report did not feel any better on 12/18, she continued to cough, feeling weak.Due to significant underline immunosuppressed status, no clinical improvement and now has mild leukocytosis on 12/18 , doxycycline empirically started to prevent/treat bacterial superinfection. repeat cxr no lung infiltrate. 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 CT on admission showed: "Enlarging abnormal soft tissue in the left upper lobe at the prior surgical site concerning for recurrent tumor. This measures up to 2.2 cm. This could be further evaluated with PET CT. Stable left lower lobe pulmonary nodule. Borderline and mildly enlarged mediastinal lymph nodes. - thoracic surgery Dr. Servando Snare consulted who recommended have PET scan and outpatient  follow up with pulmonology and thoracic surgery   Hypertension -continue metoprolol -continue losartan  CAD s/p CABG -continue lipitor/plavix ( report allergic to asa)   CVA (TIA in 2016) -continue statin/Plavix  CKD stage III Stable  Adrenal mass -stable  Hypothyroidism -continue levothyroxine    05/22/2016 extended post op  f/u ov/Graycee Greeson re: acute resp failure ? Viral related  Chief Complaint  Patient presents with  . Hospitalization Follow-up    Breathing has improved but not back to her normal baseline. She is coughing less.    throat clearing x 10 years worse in daytime > never cough up excess/ purulent sputum or mucus plugs  Or blood Worse in late  November 2017 > with noct cough  referred to Dr Servando Snare for new  Cough worse off nasal spray  = flonase  Started pepcid ? otc hs some better  Cough resolves on prednisone every time  Doe = MMRC2 = can't walk a nl pace on a flat grade s sob but does fine slow and flat eg shopping at [employer], nothing more      No obvious day to day or daytime  variability or assoc   cp or chest tightness, subjective wheeze or overt   hb symptoms. No unusual exp hx or h/o childhood pna/ asthma or knowledge of premature birth.  Sleeping ok without nocturnal    exacerbation  of respiratory  c/o's or need for noct saba. Also denies any obvious fluctuation of symptoms with weather or environmental changes or other aggravating or alleviating factors except as outlined above   Current Medications, Allergies, Complete Past Medical History, Past Surgical History, Family History, and Social History were reviewed in [employer] record.               Objective:   Physical Exam  amb wf nad   05/22/2016        148   02/15/15 155 lb 6.4 oz (70.489 kg)  01/29/15 154 lb (69.854 kg)  11/26/14 153 lb 8 oz (69.627 kg)    Vital signs reviewed - Note on arrival 02 sats  94% on RA     HEENT: nl dentition, turbinates, and  orophanx. Nl external ear canals without cough reflex   NECK :  without JVD/Nodes/TM/ nl carotid upstrokes bilaterally   LUNGS: no acc muscle use, clear to A and P bilaterally without cough on insp or exp maneuvers   CV:  RRR  no s3 or murmur or increase in P2, no edema   ABD:  soft and nontender with nl excursion in the supine position. No bruits or organomegaly, bowel sounds nl  MS:  warm without deformities, calf tenderness, cyanosis or clubbing  SKIN: warm and dry without lesions    NEURO:  alert, approp, no deficits         I personally reviewed images and agree with radiology impression as follows:  CTa Chest  04/15/16  Enlarging abnormal soft tissue in the left upper lobe at the prior surgical site concerning for recurrent tumor. This measures up to 2.2 cm.  Stable left lower lobe pulmonary nodule. Borderline and mildly enlarged mediastinal lymph nodes     Labs ordered 05/22/2016  Allergy profile        Assessment & Plan:

## 2016-05-22 NOTE — Patient Instructions (Addendum)
Please remember to go to the lab  department downstairs for your tests - we will call you with the results when they are available.  Prednisone 10 mg take  4 each am x 2 days,  2 each am x 2 days,  1 each am x 2 days and stop   Singulair 10 mg (montelukast)  at bedtime   Pantoprazole (protonix) 40 mg   Take  30-60 min before first meal of the day and Pepcid (famotidine)  20 mg one @  bedtime until return to office - this is the best way to tell whether stomach acid is contributing to your problem.    GERD (REFLUX)  is an extremely common cause of respiratory symptoms just like yours , many times with no obvious heartburn at all.    It can be treated with medication, but also with lifestyle changes including elevation of the head of your bed (ideally with 6 inch  bed blocks),  Smoking cessation, avoidance of late meals, excessive alcohol, and avoid fatty foods, chocolate, peppermint, colas, red wine, and acidic juices such as orange juice.  NO MINT OR MENTHOL PRODUCTS SO NO COUGH DROPS   USE SUGARLESS CANDY INSTEAD (Jolley ranchers or Stover's or Life Savers) or even ice chips will also do - the key is to swallow to prevent all throat clearing. NO OIL BASED VITAMINS - use powdered substitutes.  Please schedule a follow up office visit in 4 weeks, sooner if needed

## 2016-05-23 LAB — RESPIRATORY ALLERGY PROFILE REGION II ~~LOC~~
Allergen, Cedar tree, t12: <0.1 kU/L
Allergen, Cottonwood, t14: <0.1 kU/L
Allergen, D pternoyssinus,d7: <0.1 kU/L
Allergen, Mouse Urine Protein, e78: <0.1 kU/L
Allergen, Oak,t7: <0.1 kU/L
Aspergillus fumigatus, m3: <0.1 kU/L
Bermuda Grass: <0.1 kU/L
Cat Dander: <0.1 kU/L
IGE (IMMUNOGLOBULIN E), SERUM: 10 kU/L (ref <115)
Pecan/Hickory Tree IgE: <0.1 kU/L
Timothy Grass: <0.1 kU/L

## 2016-05-25 ENCOUNTER — Telehealth: Payer: Self-pay | Admitting: *Deleted

## 2016-05-25 ENCOUNTER — Ambulatory Visit: Payer: Medicare Other | Admitting: Cardiothoracic Surgery

## 2016-05-25 ENCOUNTER — Other Ambulatory Visit: Payer: Medicare Other

## 2016-05-25 NOTE — Telephone Encounter (Signed)
-----   Message from Tanda Rockers, MD sent at 05/25/2016  5:24 AM EST ----- Allergy studies neg  - I accidentally closed the result

## 2016-05-25 NOTE — Telephone Encounter (Signed)
Spoke with pt and notified of results per Dr. Wert. Pt verbalized understanding and denied any questions. 

## 2016-05-28 DIAGNOSIS — J449 Chronic obstructive pulmonary disease, unspecified: Secondary | ICD-10-CM | POA: Insufficient documentation

## 2016-05-28 NOTE — Assessment & Plan Note (Signed)
Onset around 2008 - Allergy profile 05/22/2016 >  Eos 0.3 /  IgE  10 neg rast - singulair / gerd rx started 05/22/2016 >>>   Upper airway cough syndrome (previously labeled PNDS) , is  so named because it's frequently impossible to sort out how much is  CR/sinusitis with freq throat clearing (which can be related to primary GERD)   vs  causing  secondary (" extra esophageal")  GERD from wide swings in gastric pressure that occur with throat clearing, often  promoting self use of mint and menthol lozenges that reduce the lower esophageal sphincter tone and exacerbate the problem further in a cyclical fashion.   These are the same pts (now being labeled as having "irritable larynx syndrome" by some cough centers) who not infrequently have a history of having failed to tolerate ace inhibitors,  dry powder inhalers or biphosphonates or report having atypical/extraesophageal reflux symptoms that don't respond to standard doses of PPI  and are easily confused as having aecopd or asthma flares by even experienced allergists/ pulmonologists (myself included).   Probably multifactorial so rx as gerd and rhinitis (with singulair) for now

## 2016-05-28 NOTE — Assessment & Plan Note (Signed)
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 This measures up to 2.2 cm.  Stable left lower lobe pulmonary nodule. Borderline and mildly enlarged mediastinal lymph nodes > f/u Dr Servando Snare  Most likely this is the same benign process that was previously bx'd but clearly has nothing to do with her symptoms and f/u has already been arranged by Dr Darnell Level

## 2016-05-28 NOTE — Assessment & Plan Note (Addendum)
Quit smoking in her 32s - PFT's  02/19/15   FEV1 1.78 (90 % ) ratio 66  p 12 % improvement from saba p nothing  prior to study with DLCO  71 % corrects to 78 % for alv volume  And classical curvature   So she has very mild copd with symptoms that are more typical of uacs (see separate a/p)   As I explained to this patient in detail:  although there may be copd present, it may not be clinically relevant:   it does not appear to be limiting activity tolerance any more than a set of worn tires limits someone from driving a car  around a parking lot.    That is to say:   this pt is so sedentary I don't recommend aggressive pulmonary rx at this point unless limiting symptoms arise or acute exacerbations become as issue, neither of which is the case now.  I asked the patient to contact this office at any time in the future should either of these problems arise.    I had an extended discussion with the patient reviewing all relevant studies (including extensive inpt records)  completed to date and  lasting 15 to 20 minutes of a 25 minute visit    Each maintenance medication was reviewed in detail including most importantly the difference between maintenance and prns and under what circumstances the prns are to be triggered using an action plan format that is not reflected in the computer generated alphabetically organized AVS.    Please see AVS for specific instructions unique to this visit that I personally wrote and verbalized to the the pt in detail and then reviewed with pt  by my nurse highlighting any  changes in therapy recommended at today's visit to their plan of care.

## 2016-06-01 ENCOUNTER — Ambulatory Visit (INDEPENDENT_AMBULATORY_CARE_PROVIDER_SITE_OTHER): Payer: Medicare Other | Admitting: General Practice

## 2016-06-01 DIAGNOSIS — Z23 Encounter for immunization: Secondary | ICD-10-CM

## 2016-06-08 ENCOUNTER — Encounter: Payer: Self-pay | Admitting: Cardiothoracic Surgery

## 2016-06-08 ENCOUNTER — Ambulatory Visit
Admission: RE | Admit: 2016-06-08 | Discharge: 2016-06-08 | Disposition: A | Discharge status: Home / Self Care | Payer: Medicare Other | Source: Ambulatory Visit | Attending: Cardiothoracic Surgery | Admitting: Cardiothoracic Surgery

## 2016-06-08 ENCOUNTER — Ambulatory Visit (INDEPENDENT_AMBULATORY_CARE_PROVIDER_SITE_OTHER): Payer: Medicare Other | Admitting: Cardiothoracic Surgery

## 2016-06-08 VITALS — BP 124/70 | HR 90 | Resp 20

## 2016-06-08 DIAGNOSIS — R911 Solitary pulmonary nodule: Secondary | ICD-10-CM

## 2016-06-08 NOTE — Progress Notes (Signed)
[address]       [place] [address]             [phone]      Nanda E Muscatello [place] Medical Record #[id number] Date of Birth: Sep 02, 1940  Referring: Tanda Rockers, MD Primary Care: [name], MD  Chief Complaint:   POST OP FOLLOW UP 03/02/2015 OPERATIVE REPORT PREOPERATIVE DIAGNOSIS: Left upper lobe lung mass POSTOPERATIVE DIAGNOSIS: Left upper lobe lung mass. 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 PROCEDURE PERFORMED: Video bronchoscopy, left video-assisted thoracoscopy, minithoracotomy, wedge resection of left upper lobe. SURGEON: Lanelle Bal, M.D Previous left VATS lung resection Lung, wedge biopsy/resection, left upper lobe - NECROTIZING GRANULOMA, 1.7 CM. - NO TUMOR SEEN. - SEE COMMENT. Microscopic Comment Sections demonstrate a 1.7 cm necrotizing granuloma. There is associated reactive pneumocyte hyperplasia with reactive atypia but no true tumor is identified. An AFB stain is performed on a representative section which is negative for acid fast bacilli. A GMS and PAS stain are also performed on a representative section which are both negative for fungal organisms. Although specimen stains for microorganisms are negative, stains are not as sensitive as other microbiologic techniques including culture. Please correlate with culture results. (RAH:gt, 03/04/15) [name] Pathologist, Electronic Signature (Case signed 03/04/2015  History of Present Illness:     Patient was  admitted to [facility] in December 2017 with respiratory problems admitted , a CT scan of the chest was done at that time the report notes recurrent malignancy and old stable line, however the patient has no previous history of lung cancer nor lobectomy.  Repeat ct of chest done with enlarging are left upper lobe pleural based, not at area of previous wedge resection (patient did not have lobectomy as some notes indicate, this was  interpreted as recurrent malignancy however the patient previous surgery was not related to malignancy, she has no previous history of lung cancer. Since discharge from hospital she has improved and with decreased cough but still complains of some breathlessness.  A repeat CT of the chest was done today  She did smoke for 5-7 years but quit at age 45. With her limited smoking history she would not be eligible for lung cancer screening CT program. Past Medical History:  Diagnosis Date  . Abdominal aortic aneurysm (HCC)    REPAIRED IN 1993 BY [name]  AND HAS RECENTLY BEEN FOLLOWED BY [name]  . 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 Adenomatous colon polyp    tubular  . Anemia   . CAD in native artery 1996, 2002, 2005    Status post CABG x1 with LIMA-LAD for ostial LAD 90% stenosis --> down to 50% in 2002 and 30% in 2005.;  Atretic LIMA; Myoview 06/2010: Fixed anteroseptal, apical and inferoapical defect with moderate size. Most likely scar. Mild subendocardial ischemia. EF 71% LOW RISK.   [name] Cervical disc disease   . Chronic kidney disease   . COPD (chronic obstructive pulmonary disease) ([place])   . Diverticulosis   . Hyperlipidemia   . Hypertension   . Hypothyroidism (acquired)    hypo  . Myocardial infarction Aug. 2016   TIA  . Polymyalgia rheumatica (HCC)    2011 Dr. Charlestine Night  . Rheumatoid arthritis ([place]) 2011   Dr.Truslow; fracture knees, hands and wrists -   . S/P CABG x 1 1996   CABG--LIMA-LAD  for ostial LAD (not felt to be PCI amenable). EF NORMAL then; LIMA now atretic  . Shortness of breath dyspnea    with exertion  . Stroke [facility]) 11-2014   TIA   . Urinary frequency      History  Smoking Status  . Former Smoker  . Quit date: 12/04/1958  Smokeless Tobacco  . Never Used    History  Alcohol Use No     Allergies  Allergen Reactions  . Aspirin Other (See Comments)    Hurts stomach  .  Codeine Nausea And Vomiting  . Nitroglycerin Other (See Comments)    Heart rate drops    Current Outpatient Prescriptions  Medication Sig Dispense Refill  . acetaminophen (TYLENOL) 500 MG tablet Take 500 mg by mouth every 6 (six) hours as needed for mild pain.    [name] albuterol (PROVENTIL HFA;VENTOLIN HFA) 108 (90 Base) MCG/ACT inhaler Inhale 2 puffs into the lungs every 6 (six) hours as needed for wheezing or shortness of breath. 1 Inhaler 0  . atorvastatin (LIPITOR) 80 MG tablet Take 1 tablet by mouth  daily 90 tablet 3  . cetirizine (ZYRTEC) 10 MG tablet Take 10 mg by mouth daily.    . clopidogrel (PLAVIX) 75 MG tablet Take 1 tablet by mouth  daily 90 tablet 3  . famotidine (PEPCID) 20 MG tablet One at bedtime 30 tablet 2  . fluticasone (FLONASE) 50 MCG/ACT nasal spray     . leflunomide (ARAVA) 20 MG tablet Take 1 tablet (20 mg total) by mouth every other day. 15 tablet 6  . levothyroxine (SYNTHROID, LEVOTHROID) 88 MCG tablet Take 1 tablet by mouth  daily 90 tablet 3  . loperamide (IMODIUM A-D) 2 MG tablet Take 2 mg by mouth 4 (four) times daily as needed for diarrhea or loose stools.    [name] losartan (COZAAR) 25 MG tablet Take 1 tablet by mouth  daily 90 tablet 3  . metoprolol succinate (TOPROL-XL) 25 MG 24 hr tablet Take 1 tablet by mouth  daily 90 tablet 3  . montelukast (SINGULAIR) 10 MG tablet Take 1 tablet (10 mg total) by mouth at bedtime. 30 tablet 11  . pantoprazole (PROTONIX) 40 MG tablet Take 1 tablet (40 mg total) by mouth daily. Take 30-60 min before first meal of the day 30 tablet 2  . predniSONE (DELTASONE) 10 MG tablet Take  4 each am x 2 days,   2 each am x 2 days,  1 each am x 2 days and stop 14 tablet 0  . Probiotic Product (ALIGN) 4 MG CAPS Take 1 capsule by mouth daily. 30 capsule 1  . psyllium (METAMUCIL) 58.6 % packet Take 1 packet by mouth daily.     No current facility-administered medications for this visit.        Physical Exam: BP 124/70   Pulse 90   Resp 20    SpO2 95%   General appearance: alert, cooperative, appears stated age and no distress Neurologic: intact Heart: regular rate and rhythm, S1, S2 normal, no murmur, click, rub or gallop Lungs: clear to auscultation bilaterally Abdomen: soft, non-tender; bowel sounds normal; no masses,  no organomegaly Extremities: extremities normal, atraumatic, no cyanosis or edema and Homans sign is negative, no sign of DVT Wound: Her left chest incision and chest tube sites are all well-healed No cervical  Nodes noted   Diagnostic Studies & Laboratory data:     Recent Radiology Findings:  Ct Chest Wo Contrast  Result Date: 06/08/2016  CLINICAL DATA:  Patient with shortness of breath. Prior wedge resection. Follow-up pulmonary nodule. EXAM: CT CHEST WITHOUT CONTRAST TECHNIQUE: Multidetector CT imaging of the chest was performed following the standard protocol without IV contrast. COMPARISON:  CT chest 04/15/2016. FINDINGS: Cardiovascular: Aortic atherosclerosis. Heart is mildly enlarged. No pericardial effusion. Coronary arterial vascular calcifications. Mediastinum/Nodes: Normal appearing esophagus. No enlarged axillary, mediastinal or hilar adenopathy. Lungs/Pleura: Central airways are patent. Patchy ground-glass opacities within the lower lobes bilaterally. Unchanged perifissural 7 mm nodule within the left mid lung (image 47; series 2). Unchanged 7 mm perifissural nodule within the left mid lung (image 57; series 4). Unchanged 5 mm perifissural nodule (image 39; series 4) left mid lung. Postsurgical changes within the medial left upper lobe. Significant interval decrease in size and near complete resolution of previously described soft tissue along the suture line within the medial left upper lobe (image 25; series 4). No pleural effusion or pneumothorax. Upper Abdomen: No acute process. Musculoskeletal: No aggressive or acute appearing osseous lesions. Status post median sternotomy. Thoracic spine degenerative  changes. IMPRESSION: Interval decrease in size and near complete resolution of previously described soft tissue along the suture line within the medial left upper lobe, most suggestive of resolving infectious process. Stable perifissural nodules within the left mid lung. Recommend additional follow-up chest CT in 18- 24 months. Electronically Signed   By: Lovey Newcomer M.D.   On: 06/08/2016 14:57    Dg Chest 2 View  Result Date: 04/17/2016 CLINICAL DATA:  Productive cough for the past month. EXAM: CHEST  2 VIEW COMPARISON:  04/15/2016; 07/22/2015; chest CT - 04/15/2016 FINDINGS: Grossly unchanged cardiac silhouette and mediastinal contours post median sternotomy. Atherosclerotic plaque within the thoracic aorta. The lungs remain hyperexpanded with flattening of the diaphragms and thinning of the biapical pulmonary parenchyma. Known ill-defined soft tissue about the left upper lobe surgical resection site is not well demonstrated on the present examination. No focal airspace opacities. No pleural effusion or pneumothorax. No evidence of edema. IMPRESSION: 1. Hyperexpanded lungs without acute cardiopulmonary disease. Emphysema. (ICD10-J43.9) 2. Previously identified soft tissue about the left upper lobe surgical resection site is suboptimally evaluated on the present examination. Correlation with report from chest CT performed 04/15/2016 is recommended. Electronically Signed   By: Sandi Mariscal M.D.   On: 04/17/2016 15:42     Ct Angio Chest Pe W And/or Wo Contrast  Result Date: 04/15/2016 CLINICAL DATA:  Elevated D-dimer.  Productive cough congestion. EXAM: CT ANGIOGRAPHY CHEST WITH CONTRAST TECHNIQUE: Multidetector CT imaging of the chest was performed using the standard protocol during bolus administration of intravenous contrast. Multiplanar CT image reconstructions and MIPs were obtained to evaluate the vascular anatomy. CONTRAST:  80 cc Isovue 370 IV COMPARISON:  11/23/2015 FINDINGS: Cardiovascular: No  filling defects in the pulmonary arteries to suggest pulmonary emboli. Heart is normal size. Prior CABG. Aorta is tortuous with scattered calcifications, non aneurysmal. Mediastinum/Nodes: Mildly enlarged mediastinal lymph nodes. Subcarinal lymph node has a short axis diameter of 13 mm. Other small scattered paratracheal and AP window lymph nodes. No hilar or axillary adenopathy. Lungs/Pleura: Increasing density noted in the medial left upper lobe in the area of prior postoperative change. This soft tissue measures 2.2 x 1.7 cm on image 15. Given the location in the postoperative bed, this is concerning for tumor recurrence. Nodule in the left lower lobe on image 38 measures 8 mm on image 38, stable. Scarring noted in the lung bases. No pleural effusions. Upper Abdomen: Imaging into the upper  abdomen shows no acute findings. Musculoskeletal: Chest wall soft tissues are unremarkable. No acute bony abnormality or focal bone lesion. Review of the MIP images confirms the above findings. IMPRESSION: Enlarging abnormal soft tissue in the left upper lobe at the prior surgical site concerning for recurrent tumor. This measures up to 2.2 cm. This could be further evaluated with PET CT. Stable left lower lobe pulmonary nodule. Borderline and mildly enlarged mediastinal lymph nodes. Electronically Signed   By: Rolm Baptise M.D.   On: 04/15/2016 15:26    Ct Chest Wo Contrast  Result Date: 11/23/2015 CLINICAL DATA:  Followup left lung nodule. Previous wedge resection revealed necrotizing granuloma. EXAM: CT CHEST WITHOUT CONTRAST TECHNIQUE: Multidetector CT imaging of the chest was performed following the standard protocol without IV contrast. COMPARISON:  01/29/2015 FINDINGS: Cardiovascular: Heart size within normal limits. Aortic atherosclerosis noted. Previous CABG. Mediastinum/Lymph Nodes: No masses or pathologically enlarged lymph nodes identified on this un-enhanced exam. Lungs/Pleura: Postop changes seen in the medial  left upper lobe from previous wedge resection. 8 mm mean diameter perifissural nodule nodule is seen in the left midlung on image 66/4. This is new since previous study, but likely represents an intrapulmonary lymph node. Mild centrilobular emphysema noted. Bibasilar scarring and tiny sub-cm posterior right lower lobe pulmonary nodule remains stable. No evidence of acute infiltrate or pleural effusion. Upper abdomen: No acute findings. Musculoskeletal: No chest wall mass or suspicious bone lesions identified. IMPRESSION: Postop changes in left upper lobe. 8 mm mean diameter perifissural nodule in the left midlung is new, but likely represents an intrapulmonary lymph node. Non-contrast chest CT at 6-12 months is recommended. If the nodule is stable at time of repeat CT, then future CT at 18-24 months (from today's scan) is considered optional for low-risk patients, but is recommended for high-risk patients. This recommendation follows the consensus statement: Guidelines for Management of Incidental Pulmonary Nodules Detected on CT Images:From the Fleischner Society 2017; published online before print (10.1148/radiol.[id number]). Aortic atherosclerosis and emphysema. Electronically Signed   By: Earle Gell M.D.   On: 11/23/2015 15:46    Recent Lab Findings: Lab Results  Component Value Date   WBC 8.9 05/22/2016   HGB 13.1 05/22/2016   HCT 40.0 05/22/2016   PLT 296.0 05/22/2016   GLUCOSE 89 04/18/2016   CHOL 146 04/25/2016   TRIG 93 04/25/2016   HDL 60 04/25/2016   LDLDIRECT 162.1 10/28/2009   LDLCALC 68 04/25/2016   ALT 11 04/25/2016   AST 17 04/25/2016   NA 142 04/18/2016   K 4.1 04/18/2016   CL 108 04/18/2016   CREATININE 1.22 (H) 04/18/2016   BUN 26 (H) 04/18/2016   CO2 26 04/18/2016   TSH 1.65 12/23/2015   INR 0.96 04/15/2016   10/2015   03/2016   PFT's 10 /2016 FEV1 1.58 80% DLCO 16.27 71%  Interpretation: The FEV1 is normal, but the FEV1/FVC ratio and FEF25-75% are reduced.  While the TLC, FRC and SVC are within normal limits, the RV is increased. Following administration of bronchodilators, there is a slight response. The reduced diffusing capacity indicates a mild loss of functional alveolar capillary surface. However, the diffusing capacity was not corrected for the patient's hemoglobin. 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 So  far on cultures and smears no specific organisms have been identified.    A PET scan was recommended by radiology following the CT in December 2017, however with the the patient's acute respiratory symptoms I recommended to the hospital so we see her as an outpatient before proceeding with it scan.   Repeat CT scan shows near complete resolution of the area in question on the scan done in December 2017, follow-up CT in 18-24 months is recommended however a feeling this is too long interval. We'll plan to see the patient back in one year with a follow-up CT scan   Grace Isaac MD      [address] [place] [address] Office [phone]   Beeper [phone]  06/08/2016 3:51 PM

## 2016-06-12 ENCOUNTER — Ambulatory Visit (INDEPENDENT_AMBULATORY_CARE_PROVIDER_SITE_OTHER): Payer: Medicare Other | Admitting: Cardiology

## 2016-06-12 VITALS — BP 114/67 | HR 104 | Ht 63.0 in | Wt 151.0 lb

## 2016-06-12 DIAGNOSIS — I25119 Atherosclerotic heart disease of native coronary artery with unspecified angina pectoris: Secondary | ICD-10-CM

## 2016-06-12 DIAGNOSIS — Z951 Presence of aortocoronary bypass graft: Secondary | ICD-10-CM

## 2016-06-12 DIAGNOSIS — I10 Essential (primary) hypertension: Secondary | ICD-10-CM | POA: Diagnosis not present

## 2016-06-12 DIAGNOSIS — R0609 Other forms of dyspnea: Secondary | ICD-10-CM

## 2016-06-12 DIAGNOSIS — I713 Abdominal aortic aneurysm, ruptured, unspecified: Secondary | ICD-10-CM

## 2016-06-12 DIAGNOSIS — E785 Hyperlipidemia, unspecified: Secondary | ICD-10-CM

## 2016-06-12 DIAGNOSIS — K551 Chronic vascular disorders of intestine: Secondary | ICD-10-CM

## 2016-06-12 NOTE — Progress Notes (Signed)
PCP: [name], MD  Clinic Note: Chief Complaint  [name] presents with  . Follow-up    [name] reports no problems since last visit.  . Coronary Artery Disease    HPI: [name] is a 76 y.o. female with a PMH below who presents today for Annual CAD follow-up. She has a LIMA to LAD from 1996 as well as a history of AAA rupture status post repair. This is in conjunction with splenic artery mesenteric artery stenosis. She is being followed by vascular surgery -Dr.Brabham. Follow-up catheterizations have shown an atretic LIMA with improved LAD flow with now 50% ostial LAD. She is a former smoker who quit in 1960. Referred to pulmonary medicine for new cough.  Cchest x-ray suggested pulmonary nodule.  [name] was last seen in February 2017  Recent Hospitalizations: December 17 COPD exacerbation. Apparently she was sick for most of October November December. Only had shortness of breath beyond her baseline at that time.  Studies Reviewed: - none.  ?planned Myoview in 2017 not done  Interval History: [name] presents here today for follow-up from, still recovering from her illness over Sherry fall. She still notes that she is a little fatigued and short of breath walking around, but is gradually improving. Otherwise really she has not noted any resting or exertional chest tightness or pressure.  No PND, orthopnea or edema. No palpitations, lightheadedness, dizziness, weakness or syncope/near syncope. No TIA/amaurosis fugax symptoms. No claudication.  ROS: A comprehensive was performed. Review of Systems  Constitutional: Positive for malaise/fatigue (Gradually improving her energy since her illness.).  HENT: Negative for congestion and sinus pain.   Respiratory: Negative for cough (No longer having cough) and wheezing.   Gastrointestinal: Negative for blood in stool and constipation.  Genitourinary: Negative for hematuria.  Musculoskeletal: Positive for joint pain. Negative  for falls.  Neurological: Positive for dizziness (Occasionally with position change).  Endo/Heme/Allergies: Does not bruise/bleed easily.  Psychiatric/Behavioral: Positive for memory loss. Negative for depression. [name] is not nervous/anxious and does not have insomnia.   All other systems reviewed and are negative.   Past Medical History:  Diagnosis Date  . Abdominal aortic aneurysm (HCC)    REPAIRED IN 1993 BY [name]  AND HAS RECENTLY BEEN FOLLOWED BY [name]  . 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 Adenomatous colon polyp    tubular  . Anemia   . CAD in native artery 1996, 2002, 2005    Status post CABG x1 with LIMA-LAD for ostial LAD 90% stenosis --> down to 50% in 2002 and 30% in 2005.;  Atretic LIMA; Myoview 06/2010: Fixed anteroseptal, apical and inferoapical defect with moderate size. Most likely scar. Mild subendocardial ischemia. EF 71% LOW RISK.   [name] Cervical disc disease   . Chronic kidney disease   . COPD (chronic obstructive pulmonary disease) ([place])   . Diverticulosis   . Hyperlipidemia   . Hypertension   . Hypothyroidism (acquired)    hypo  . Myocardial infarction Aug. 2016   TIA  . Polymyalgia rheumatica (HCC)    2011 Dr. Charlestine Night  . Rheumatoid arthritis ([place]) 2011   Dr.Truslow; fracture knees, hands and wrists -   . S/P CABG x 1 1996   CABG--LIMA-LAD for ostial LAD (not felt to be PCI amenable). EF NORMAL then; LIMA now atretic  . Shortness of breath dyspnea    with exertion  . Stroke (  [place]) 11-2014   TIA   . Urinary frequency     Past Surgical History:  Procedure Laterality Date  . ABDOMINAL AORTIC ANEURYSM REPAIR  8676   Complicated by mesenteric artery stenosis and splenic artery infarction with acquired Asplenia  . BUNIONECTOMY  07/2011   right foot  . CARDIAC CATHETERIZATION  2005   (Most recent CATH) - ostial LAD lesion 20-30% (down from 90% initially). Atretic  LIMA. Minimal disease Sherry RCA and Circumflex system.  [name] CARPAL TUNNEL RELEASE Left   . CATARACT EXTRACTION Bilateral   . CERVICAL SPINE SURGERY     plate 2008 Dr. Saintclair Halsted  . [place]   INCLUDED AN INTERNAL MAMMARY ARTERY TO Sherry LAD. EF WAS NORMAL  . INGUINAL HERNIA REPAIR Right   . NM MYOVIEW LTD  March 2012   Fixed anteroseptal, apical and inferoapical defect with moderate size. Most likely scar. Mild subendocardial ischemia. EF 71% LOW RISK.   [name] SPLENECTOMY    . TRANSTHORACIC ECHOCARDIOGRAM  12/2014   [facility]: Normal LV size & function. EF 55-60%,   . vagina polyp    . VIDEO ASSISTED THORACOSCOPY (VATS)/WEDGE RESECTION Left 03/02/2015   Procedure: VIDEO ASSISTED THORACOSCOPY (VATS), MINI THORACOTOMY, LEFT UPPER LOBE WEDGE, TAKE DOWN OF INTERNAL MAMMARY LESIONS, PLACEMENT OF ON-Q PUMP;  Surgeon: Grace Isaac, MD;  Location: [place];  Service: Thoracic;  Laterality: Left;  [name] VIDEO BRONCHOSCOPY N/A 03/02/2015   Procedure: BRONCHOSCOPY;  Surgeon: Grace Isaac, MD;  [facility];  Service: Thoracic;  Laterality: N/A;    Current Meds  Medication Sig  . acetaminophen (TYLENOL) 500 MG tablet Take 500 mg by mouth every 6 (six) hours as needed for mild pain.  [name] albuterol (PROVENTIL HFA;VENTOLIN HFA) 108 (90 Base) MCG/ACT inhaler Inhale 2 puffs into Sherry lungs every 6 (six) hours as needed for wheezing or shortness of breath.  [name] atorvastatin (LIPITOR) 80 MG tablet Take 1 tablet by mouth  daily  . cetirizine (ZYRTEC) 10 MG tablet Take 10 mg by mouth daily.  . clopidogrel (PLAVIX) 75 MG tablet Take 1 tablet by mouth  daily  . famotidine (PEPCID) 20 MG tablet One at bedtime  . fluticasone (FLONASE) 50 MCG/ACT nasal spray   . leflunomide (ARAVA) 20 MG tablet Take 1 tablet (20 mg total) by mouth every other day.  . levothyroxine (SYNTHROID, LEVOTHROID) 88 MCG tablet Take 1 tablet by mouth  daily  . loperamide (IMODIUM A-D) 2 MG tablet Take 2 mg by mouth 4  (four) times daily as needed for diarrhea or loose stools.  [name] losartan (COZAAR) 25 MG tablet Take 1 tablet by mouth  daily  . metoprolol succinate (TOPROL-XL) 25 MG 24 hr tablet Take 1 tablet by mouth  daily  . montelukast (SINGULAIR) 10 MG tablet Take 1 tablet (10 mg total) by mouth at bedtime.  . pantoprazole (PROTONIX) 40 MG tablet Take 1 tablet (40 mg total) by mouth daily. Take 30-60 min before first meal of Sherry day  . predniSONE (DELTASONE) 10 MG tablet Take  4 each am x 2 days,   2 each am x 2 days,  1 each am x 2 days and stop  . Probiotic Product (ALIGN) 4 MG CAPS Take 1 capsule by mouth daily.  . psyllium (METAMUCIL) 58.6 % packet Take 1 packet by mouth daily.    Allergies  Allergen Reactions  . Aspirin Other (See Comments)    Hurts stomach  . Codeine Nausea  And Vomiting  . Nitroglycerin Other (See Comments)    Heart rate drops    Social History   Social History  . Marital status: Married    Spouse name: N/A  . Number of children: 4  . Years of education: N/A   Occupational History  . retired Retired   Social History Main Topics  . Smoking status: Former Smoker    Quit date: 12/04/1958  . Smokeless tobacco: Never Used  . Alcohol use No  . Drug use: No  . Sexual activity: Not Currently   Other Topics Concern  . None   Social History Narrative   Regular exercise- yes at Sherry Y.)  MARRIED -RETIRED ; [place]. GOES TO [place] 3 DAYS A WEEK FOR ABOUT 45 MIN. PER SESSION      Living at [facility] since dec 2015    family history includes Cancer in her paternal uncle; Diabetes in her mother; Heart disease in her mother; Hyperlipidemia in her mother, sister, son, son, and son; Hypertension in her daughter, mother, sister, son, son, and son; Stroke in her father.  Wt Readings from Last 3 Encounters:  06/12/16 68.5 kg (151 lb)  05/22/16 67.1 kg (148 lb)  05/04/16 65.8 kg (145 lb)    PHYSICAL EXAM BP 114/67   Pulse (!) 104   Ht 5\' 3"   (1.6 m)   Wt 68.5 kg (151 lb)   SpO2 94%   BMI 26.75 kg/m  General appearance: alert, cooperative, appears stated age, no distress and Healthy-appearing. Answers questions appropriately HEENT: [place]/AT, EOMI, MMM, anicteric sclera  Neck: no adenopathy, no carotid bruit, no JVD and supple, symmetrical, trachea midline  Lungs: clear to auscultation bilaterally, normal percussion bilaterally and Nonlabored, and good air movement  Heart: regular rate and rhythm(borderline tachy), S1&S2 normal, no murmur, click, rub or gallop and prominent apical impulse  Abdomen: soft, non-tender; bowel sounds normal; no masses, no organomegaly and Well healed abdominal aneurysm scar. No pulsatile mass. No obvious bruit  Extremities: extremities normal, atraumatic, no cyanosis or edema, varicose veins noted, venous stasis dermatitis noted and An extensive spider veins noted; more prominent on left than right  Pulses: 2+ and symmetric  Neurologic: Grossly normal     Adult ECG Report n/a   Other studies Reviewed: Additional studies/ records that were reviewed today include:  Recent Labs:  Lab Results  Component Value Date   CHOL 146 04/25/2016   HDL 60 04/25/2016   LDLCALC 68 04/25/2016   LDLDIRECT 162.1 10/28/2009   TRIG 93 04/25/2016   CHOLHDL 2.4 04/25/2016    ASSESSMENT / PLAN: Problem List Items Addressed This Visit    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 It's hard to tell if her dyspnea is related to her previous illness. We talked about doing a stress test in Sherry past, but as she has remained stable, we renegotiate that we would only do a stress test if she is having worsening symptoms.  Plan: Continue Rx alone without aspirin in addition to losartan, Toprol and atorvastatin.      Essential hypertension (Chronic)    Well-controlled on beta blocker/ARB, [name]      Mesenteric artery  stenosis (HCC) (Chronic)    Related to AAA atherosclerotic disease. No post-prandial abdominal pain. Monitor for worsening symptoms. But otherwise no change from CAD management.      S/P CABG x 1: LIMA-LAD for ostial  LAD lesion; now atretic and significantly improved LAD lesion without intervention (Chronic)    We have planned previous Myoview. After long discussion, [name] agrees that we would only do stress testing if she would have worsening symptoms as opposed to doing screening tests.      Hyperlipidemia with target LDL less than 70 (Chronic)    On stable dose of Lipitor 80 mg daily. Based on her LDL profile, which is simply continue current dose as long as she's not having any side effects.      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) (Chronic)    Followed by Dr. Trula Slade from vascular surgery      Dyspnea    At this point I think this is a stable thing for her. She still recovering from her chronic illness episode last fall. If it does not improve, would potentially consider relocating an echo versus stress test.         Current medicines are reviewed at length with [name] today. (+/- concerns) n/a Sherry following changes have been made: n/a  [name] Instructions  Your physician wants you to follow-up in: ONE YEAR with Dr. Ellyn Hack. You will receive a reminder letter in Sherry mail two months in advance. If you don't receive a letter, please call our office to schedule Sherry follow-up appointment.    Studies Ordered:   No orders of Sherry defined types were placed in this encounter.     Glenetta Hew, M.D., M.S. Interventional Cardiologist   Pager # [phone] Phone # [phone] [address] [place], [place] [address]

## 2016-06-12 NOTE — Patient Instructions (Signed)
Your physician wants you to follow-up in: ONE YEAR with Dr. Ellyn Hack. You will receive a reminder letter in the mail two months in advance. If you don't receive a letter, please call our office to schedule the follow-up appointment.

## 2016-06-13 ENCOUNTER — Encounter: Payer: Self-pay | Admitting: Cardiology

## 2016-06-13 ENCOUNTER — Other Ambulatory Visit: Payer: Self-pay | Admitting: General Surgery

## 2016-06-13 NOTE — Assessment & Plan Note (Signed)
On stable dose of Lipitor 80 mg daily. Based on her LDL profile, which is simply continue current dose as long as she's not having any side effects.

## 2016-06-13 NOTE — Assessment & Plan Note (Signed)
Well-controlled on beta blocker/ARB, patient

## 2016-06-13 NOTE — Assessment & Plan Note (Signed)
We have planned previous Myoview. After long discussion, the patient agrees that we would only do stress testing if she would have worsening symptoms as opposed to doing screening tests.

## 2016-06-13 NOTE — Assessment & Plan Note (Signed)
Related to AAA atherosclerotic disease. No post-prandial abdominal pain. Monitor for worsening symptoms. But otherwise no change from CAD management.

## 2016-06-13 NOTE — Assessment & Plan Note (Signed)
At this point I think this is a stable thing for her. She still recovering from her chronic illness episode last fall. If it does not improve, would potentially consider relocating an echo versus stress test.

## 2016-06-13 NOTE — Assessment & Plan Note (Signed)
Overall relatively stable from a cardiac standpoint. It's hard to tell if her dyspnea is related to her previous illness. We talked about doing a stress test in the past, but as she has remained stable, we renegotiate that we would only do a stress test if she is having worsening symptoms.  Plan: Continue Rx alone without aspirin in addition to losartan, Toprol and atorvastatin.

## 2016-06-13 NOTE — Assessment & Plan Note (Addendum)
Followed by Dr. Trula Slade from vascular surgery

## 2016-06-19 ENCOUNTER — Ambulatory Visit (INDEPENDENT_AMBULATORY_CARE_PROVIDER_SITE_OTHER): Payer: Medicare Other | Admitting: Internal Medicine

## 2016-06-19 ENCOUNTER — Encounter: Payer: Self-pay | Admitting: Internal Medicine

## 2016-06-19 VITALS — BP 112/66 | HR 80 | Ht 63.0 in | Wt 151.8 lb

## 2016-06-19 DIAGNOSIS — R05 Cough: Secondary | ICD-10-CM | POA: Diagnosis not present

## 2016-06-19 DIAGNOSIS — R058 Other specified cough: Secondary | ICD-10-CM

## 2016-06-19 DIAGNOSIS — J449 Chronic obstructive pulmonary disease, unspecified: Secondary | ICD-10-CM | POA: Diagnosis not present

## 2016-06-19 MED ORDER — AZELASTINE-FLUTICASONE 137-50 MCG/ACT NA SUSP: 2.0000 | Freq: Every day | NASAL | 0 refills | Status: DC

## 2016-06-19 MED ORDER — AZELASTINE-FLUTICASONE 137-50 MCG/ACT NA SUSP: 1.0000 | Freq: Two times a day (BID) | NASAL | 11 refills | Status: DC

## 2016-06-19 NOTE — Assessment & Plan Note (Signed)
Onset around 2008 - Allergy profile 05/22/2016 >  Eos 0.3 /  IgE  10 neg rast - singulair / gerd rx started 05/22/2016 >>> still with pnds 06/19/2016 > add dymista / 1st gen H1  F/u in 6 weeks to see if this spring is better than her previous 10

## 2016-06-19 NOTE — Assessment & Plan Note (Signed)
Quit smoking in her 47s - PFT's  02/19/15   FEV1 1.78 (90 % ) ratio 66  p 12 % improvement from saba p nothing  prior to study with DLCO  71 % corrects to 78 % for alv volume  And classical curvature  - Spirometry 06/19/2016  FEV1 1.65  (84%)  Ratio 69  With no prior rx   As I explained to this patient in detail:  although there is  copd present, it may not be clinically relevant and    I don't recommend aggressive pulmonary rx at this point unless limiting symptoms arise or acute exacerbations become as issue, neither of which is the case now.  I asked the patient to contact this office at any time in the future should either of these problems arise.    I had an extended discussion with the patient reviewing all relevant studies completed to date and  lasting 15 to 20 minutes of a 25 minute visit    Each maintenance medication was reviewed in detail including most importantly the difference between maintenance and prns and under what circumstances the prns are to be triggered using an action plan format that is not reflected in the computer generated alphabetically organized AVS.    Please see AVS for specific instructions unique to this visit that I personally wrote and verbalized to the the pt in detail and then reviewed with pt  by my nurse highlighting any  changes in therapy recommended at today's visit to their plan of care.

## 2016-06-19 NOTE — Progress Notes (Signed)
Subjective:    Patient ID: [name], female    DOB: 08/06/1940     MRN: [id number]     Brief patient profile:  55   yowf smoker age 76- 16 s obvious sequelae with new cough while on a cruise in September 2016 in [place] > cxr > ct chest > PET > referred to pulmonary clinic 02/15/2015 by Dr  Philis Fendt [facility] with GOLD I copd 02/19/15     History of Present Illness  02/15/2015 1st [facility] office visit/ [name]   Chief Complaint  Patient presents with  . PULMONARY CONSULT    Pulmonary nodules. Pt reports some dyspnea with exertion, some morning dry to wet cough. Pt does not use any inhalers.   Cough better but some cc am congestion x years ? Worse in winter > total ? Usually swallows so can't say color or amt assoc with mild pnds  Doe only with exertion = MMRC1 = can walk nl pace, flat grade, can't hurry or go uphills or steps s mild sob  rec Please remember to go to the lab  department downstairs for your tests - we will call you with the results when they are available. 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 Acute respiratory failure with hypoxia ([place]) 04/15/2016  . Acute respiratory failure with hypoxemia ([place]) 04/16/2016  . Essential hypertension 12/08/2006  . Chronic kidney disease 12/08/2006  . 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) 12/22/1994    Class: History of  . 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; now atretic and significantly improved LAD lesion without intervention 12/22/1994     Class: History of    Resolved Hospital Problems   Diagnosis Date Noted Date Resolved  No resolved problems to display.    Discharge Condition: stable  Diet recommendation: heart healthy      Filed Weights   04/15/16 1153 04/15/16 1900  Weight: 68 kg (150 lb) 67.9 kg (149 lb 12.8 oz)    History of present illness:  Chief Complaint: Coughing  HPI: [name] a 76 y.o.femalewith medical history significant of COPD, CAD s/p CABG x1, PVD, CVA, AAA s/p repair. Symptoms started about a month ago with a nonproductive cough. Symptoms remain persistent and she went to primary care physician about 2 weeks ago. She is prescribed antibiotics at that time which helped for about 1 day, but symptoms continue to persist. She eventually came to the emergency department because her symptoms continuedto worsen. She reports no aggravating symptoms. He has mild dyspnea at rest.  ED Course: Vitals: Afebrile. Slight tachycardia to 100s. Slightly hypertensive. On 2L O2 via [place] Labs: creatinine 1.31.  Imaging: CT shows abnormal enlarging soft tissue in left upper lobe Medications/Course: Given albuterol, atrovent and solu-medrol.  Hospital Course:  Principal Problem:   Acute respiratory failure with hypoxia [facility]) Active Problems:   Essential hypertension   Chronic kidney disease   Status post repair of Abdominal aortic aneurysm (  during acute ruptured)   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; now atretic and significantly improved LAD lesion without intervention   Acute respiratory failure with hypoxemia (HCC)   Acute respiratory failure with hypoxia with cough, failed outpatient treatment Patient report she started have cough 4weeks ago, she was seen by primary care doctor on 12/4 and on 12/16, she was treated with oral abx cedinir, however her symptom did not improve, she is sent by pmd to the hospital due to persistent symptom and she started to have tachycardia and  reduced oxygen level She presented to the hospital, initially her oxygen saturation was in the 80's, she is tachycardia in the 100's, tachypnea , her respiration rate stayed around 28 in the ED, at one point her respiration rate in the Ed got up to 45. She has mild lactic acidosis on presentation as well Initial CTA no pneumonia, no PE,  Viral panel + metapneumovirus. - supportive care and wean oxygen as able, Albuterol prn -she report did not feel any better on 12/18, she continued to cough, feeling weak.Due to significant underline immunosuppressed status, no clinical improvement and now has mild leukocytosis on 12/18 , doxycycline empirically started to prevent/treat bacterial superinfection. repeat cxr no lung infiltrate. 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 CT on admission showed: "Enlarging abnormal soft tissue in the left upper lobe at the prior surgical site concerning for recurrent tumor. This measures up to 2.2 cm. This could be further evaluated with PET CT. Stable left lower lobe pulmonary nodule. Borderline and mildly enlarged mediastinal lymph nodes. - thoracic surgery Dr. Servando Snare consulted who recommended have PET scan and outpatient follow up with pulmonology and thoracic surgery   Hypertension -continue metoprolol -continue losartan  CAD s/p CABG -continue lipitor/plavix ( report allergic to asa)   CVA (TIA in 2016) -continue statin/Plavix  CKD stage  III Stable  Adrenal mass -stable  Hypothyroidism -continue levothyroxine    05/22/2016 extended post op  f/u ov/[name] re: acute resp failure ? Viral related  Chief Complaint  Patient presents with  . Hospitalization Follow-up    Breathing has improved but not back to her normal baseline. She is coughing less.    throat clearing x 10 years worse in daytime > never cough up excess/ purulent sputum or mucus plugs  Or blood Worse in late  November 2017 > with noct cough  referred to Dr Servando Snare for new  Cough worse off nasal spray  = flonase  Started pepcid ? otc hs some better  Cough resolves on prednisone every time  Doe = MMRC2 = can't walk a nl pace on a flat grade s sob but does fine slow and flat eg shopping at [employer], nothing more rec  . Prednisone 10 mg take  4 each am x 2 days,  2 each am x 2 days,  1 each am x 2 days and stop  Singulair 10 mg (montelukast)  at bedtime  Pantoprazole (protonix) 40 mg   Take  30-60 min before first meal of the  day and Pepcid (famotidine)  20 mg one @  bedtime until return to office - this is the best way to tell whether stomach acid is contributing to your problem.   GERD diet     06/19/2016  f/u ov/[name] re: uacs/ pnds on singulair/ gerd rx/ flonase  Chief Complaint  Patient presents with  . Follow-up    Her breathing has improved to her normal baseline. She states having increased PND. Cough has "pretty much gone away".  She has not had to use albuterol inhaler.    no noct symptoms, during the day only of some sense of pnds gone only while on prednisone, back since stopped it  Not limited by breathing from desired activities    No obvious day to day or daytime variability or assoc excess/ purulent sputum or mucus plugs or hemoptysis or cp or chest tightness, subjective wheeze or overt sinus or hb symptoms. No unusual exp hx or h/o childhood pna/ asthma or knowledge of premature birth.  Sleeping ok without nocturnal  or early am exacerbation   of respiratory  c/o's or need for noct saba. Also denies any obvious fluctuation of symptoms with weather or environmental changes or other aggravating or alleviating factors except as outlined above   Current Medications, Allergies, Complete Past Medical History, Past Surgical History, Family History, and Social History were reviewed in [employer] record.  ROS  The following are not active complaints unless bolded sore throat, dysphagia, dental problems, itching, sneezing,  nasal congestion or excess/ purulent secretions, ear ache,   fever, chills, sweats, unintended wt loss, classically pleuritic or exertional cp,  orthopnea pnd or leg swelling, presyncope, palpitations, abdominal pain, anorexia, nausea, vomiting, diarrhea  or change in bowel or bladder habits, change in stools or urine, dysuria,hematuria,  rash, arthralgias, visual complaints, headache, numbness, weakness or ataxia or problems with walking or coordination,  change in mood/affect or memory.                   Objective:   Physical Exam  amb wf nad   06/19/2016        152  05/22/2016        148   02/15/15 155 lb 6.4 oz (70.489 kg)  01/29/15 154 lb (69.854 kg)  11/26/14 153 lb 8 oz (69.627 kg)    Vital signs reviewed - Note on arrival 02 sats  95% on RA     HEENT: nl dentition, turbinates, and orophanx with minimal watery pnd/ cobblestoning . Nl external ear canals without cough reflex   NECK :  without JVD/Nodes/TM/ nl carotid upstrokes bilaterally   LUNGS: no acc muscle use, clear to A and P bilaterally without cough on insp or exp maneuvers   CV:  RRR  no s3 or murmur or increase in P2, no edema   ABD:  soft and nontender with nl excursion in the supine position. No bruits or organomegaly, bowel sounds nl  MS:  warm without deformities, calf tenderness, cyanosis or clubbing  SKIN: warm and dry without lesions    NEURO:  alert, approp, no deficits         Assessment & Plan:

## 2016-06-19 NOTE — Patient Instructions (Signed)
Stop flonase and try dymista one twice daily   For drainage / throat tickle instead of zyrtec ok to  try   CHLORPHENIRAMINE  4 mg - take one every 4 hours as needed - available over the counter- may cause drowsiness so start with just a bedtime dose or two and see how you tolerate it before trying in daytime     Please schedule a follow up office visit in 6 weeks, call sooner if needed

## 2016-06-20 ENCOUNTER — Telehealth: Payer: Self-pay

## 2016-06-20 NOTE — Telephone Encounter (Signed)
Patient is having laparoscopic appendectomy in the near future. [facility]/ PA needs medical clearance note sent to their office... Does patient need to come into office prior! Last ov 04/19/2016 and her next ov 08/17/2016.[name] Form in folder.... Please advise

## 2016-06-21 NOTE — Telephone Encounter (Signed)
OK - medically clear for colectomy. Needs Card clearance too. Thx

## 2016-06-22 ENCOUNTER — Encounter: Payer: Medicare Other | Admitting: Cardiothoracic Surgery

## 2016-06-23 NOTE — Progress Notes (Signed)
  Patient instructed to call dr Milana Huntsman regarding plavix and when to stop for surgery

## 2016-06-26 ENCOUNTER — Encounter (HOSPITAL_COMMUNITY): Payer: Self-pay

## 2016-06-26 NOTE — Progress Notes (Signed)
Spoke with Sunday Spillers from Dr Marcello Moores office. Surgery is booked as an appendectomy but clearance from primary is for a colectomy. Informed sylvia of this she report she will follow up and determine which surgery is patient will be having and get back to me with any info and clearances.

## 2016-06-26 NOTE — Patient Instructions (Addendum)
[name]  06/26/2016   Your procedure is scheduled on: 06-28-16  Report to [facility] Main  Entrance take [facility]  elevators to 3rd floor to  [place] [facility] [place].  Call this number if you have problems the morning of surgery [phone]   Remember: ONLY 1 PERSON MAY GO WITH YOU TO SHORT STAY TO GET  READY MORNING OF [place].  Do not eat food or drink liquids :After Midnight.     Take these medicines the morning of surgery with A SIP OF WATER: metoprolol(toprol), levothyroxine(synthroid), atorvastatin(lipitor), chlorpheniramine(Chlortrimeton), tylenol as needed, nasal spray and inhaler as needed                                  You may not have any metal on your body including hair pins and              piercings  Do not wear jewelry, make-up, lotions, powders or perfumes, deodorant             Do not wear nail polish.  Do not shave  48 hours prior to surgery.              Men may shave face and neck.   Do not bring valuables to the hospital. [place].  Contacts, dentures or bridgework may not be worn into surgery.  Leave suitcase in the car. After surgery it may be brought to your room.              Please read over the following fact sheets you were given: _____________________________________________________________________             [facility] - Preparing for Surgery Before surgery, you can play an important role.  Because skin is not sterile, your skin needs to be as free of germs as possible.  You can reduce the number of germs on your skin by washing with CHG (chlorahexidine gluconate) soap before surgery.  CHG is an antiseptic cleaner which kills germs and bonds with the skin to continue killing germs even after washing. Please DO NOT use if you have an allergy to CHG or antibacterial soaps.  If your skin becomes reddened/irritated stop using the CHG and inform your nurse when you  arrive at Short Stay. Do not shave (including legs and underarms) for at least 48 hours prior to the first CHG shower.  You may shave your face/neck. Please follow these instructions carefully:  1.  Shower with CHG Soap the night before surgery and the  morning of Surgery.  2.  If you choose to wash your hair, wash your hair first as usual with your  normal  shampoo.  3.  After you shampoo, rinse your hair and body thoroughly to remove the  shampoo.                           4.  Use CHG as you would any other liquid soap.  You can apply chg directly  to the skin and wash                       Gently with a scrungie or  clean washcloth.  5.  Apply the CHG Soap to your body ONLY FROM THE NECK DOWN.   Do not use on face/ open                           Wound or open sores. Avoid contact with eyes, ears mouth and genitals (private parts).                       Wash face,  Genitals (private parts) with your normal soap.             6.  Wash thoroughly, paying special attention to the area where your surgery  will be performed.  7.  Thoroughly rinse your body with warm water from the neck down.  8.  DO NOT shower/wash with your normal soap after using and rinsing off  the CHG Soap.                9.  Pat yourself dry with a clean towel.            10.  Wear clean pajamas.            11.  Place clean sheets on your bed the night of your first shower and do not  sleep with pets. Day of Surgery : Do not apply any lotions/deodorants the morning of surgery.  Please wear clean clothes to the hospital/surgery center.  FAILURE TO FOLLOW THESE INSTRUCTIONS MAY RESULT IN THE CANCELLATION OF YOUR SURGERY PATIENT SIGNATURE_________________________________  NURSE SIGNATURE__________________________________  ________________________________________________________________________

## 2016-06-26 NOTE — Progress Notes (Signed)
LOV cardio Ellyn Hack 06-12-16 epic Medical clearance Plotnikov 06-21-16 epic Cardiac clearance Gerhardt 06-09-16 epic CT chest 06-08-16 epic EKG 04-16-16 epic

## 2016-06-27 ENCOUNTER — Encounter (HOSPITAL_COMMUNITY)
Admission: RE | Admit: 2016-06-27 | Discharge: 2016-06-27 | Disposition: A | Discharge status: Home / Self Care | Payer: Medicare Other | Source: Ambulatory Visit | Attending: General Surgery | Admitting: General Surgery

## 2016-06-27 ENCOUNTER — Encounter (HOSPITAL_COMMUNITY): Payer: Self-pay | Admitting: *Deleted

## 2016-06-27 ENCOUNTER — Encounter (HOSPITAL_COMMUNITY): Payer: Self-pay

## 2016-06-27 DIAGNOSIS — Z9081 Acquired absence of spleen: Secondary | ICD-10-CM | POA: Diagnosis not present

## 2016-06-27 DIAGNOSIS — Z8261 Family history of arthritis: Secondary | ICD-10-CM | POA: Diagnosis not present

## 2016-06-27 DIAGNOSIS — I2510 Atherosclerotic heart disease of native coronary artery without angina pectoris: Secondary | ICD-10-CM | POA: Diagnosis not present

## 2016-06-27 DIAGNOSIS — K388 Other specified diseases of appendix: Secondary | ICD-10-CM | POA: Diagnosis not present

## 2016-06-27 DIAGNOSIS — J449 Chronic obstructive pulmonary disease, unspecified: Secondary | ICD-10-CM | POA: Diagnosis not present

## 2016-06-27 DIAGNOSIS — Z9889 Other specified postprocedural states: Secondary | ICD-10-CM | POA: Diagnosis not present

## 2016-06-27 DIAGNOSIS — I739 Peripheral vascular disease, unspecified: Secondary | ICD-10-CM | POA: Diagnosis not present

## 2016-06-27 DIAGNOSIS — Z79899 Other long term (current) drug therapy: Secondary | ICD-10-CM | POA: Diagnosis not present

## 2016-06-27 DIAGNOSIS — D120 Benign neoplasm of cecum: Secondary | ICD-10-CM | POA: Diagnosis present

## 2016-06-27 DIAGNOSIS — E7800 Pure hypercholesterolemia, unspecified: Secondary | ICD-10-CM | POA: Diagnosis not present

## 2016-06-27 DIAGNOSIS — Z7901 Long term (current) use of anticoagulants: Secondary | ICD-10-CM | POA: Diagnosis not present

## 2016-06-27 DIAGNOSIS — Z823 Family history of stroke: Secondary | ICD-10-CM | POA: Diagnosis not present

## 2016-06-27 DIAGNOSIS — M069 Rheumatoid arthritis, unspecified: Secondary | ICD-10-CM | POA: Diagnosis not present

## 2016-06-27 DIAGNOSIS — Z8249 Family history of ischemic heart disease and other diseases of the circulatory system: Secondary | ICD-10-CM | POA: Diagnosis not present

## 2016-06-27 DIAGNOSIS — E079 Disorder of thyroid, unspecified: Secondary | ICD-10-CM | POA: Diagnosis not present

## 2016-06-27 DIAGNOSIS — Z8673 Personal history of transient ischemic attack (TIA), and cerebral infarction without residual deficits: Secondary | ICD-10-CM | POA: Diagnosis not present

## 2016-06-27 DIAGNOSIS — Z951 Presence of aortocoronary bypass graft: Secondary | ICD-10-CM | POA: Diagnosis not present

## 2016-06-27 DIAGNOSIS — Z87891 Personal history of nicotine dependence: Secondary | ICD-10-CM | POA: Diagnosis not present

## 2016-06-27 DIAGNOSIS — M1990 Unspecified osteoarthritis, unspecified site: Secondary | ICD-10-CM | POA: Diagnosis not present

## 2016-06-27 DIAGNOSIS — K660 Peritoneal adhesions (postprocedural) (postinfection): Secondary | ICD-10-CM | POA: Diagnosis not present

## 2016-06-27 DIAGNOSIS — Z833 Family history of diabetes mellitus: Secondary | ICD-10-CM | POA: Diagnosis not present

## 2016-06-27 DIAGNOSIS — I10 Essential (primary) hypertension: Secondary | ICD-10-CM | POA: Diagnosis not present

## 2016-06-27 LAB — CBC
HCT: 37 % (ref 36.0–46.0)
Hemoglobin: 12.1 g/dL (ref 12.0–15.0)
MCH: 30.4 pg (ref 26.0–34.0)
MCHC: 32.7 g/dL (ref 30.0–36.0)
MCV: 93 fL (ref 78.0–100.0)
PLATELETS: 424 10*3/uL — AB (ref 150–400)
RBC: 3.98 MIL/uL (ref 3.87–5.11)
RDW: 16.9 % — AB (ref 11.5–15.5)
WBC: 7.4 10*3/uL (ref 4.0–10.5)

## 2016-06-27 LAB — BASIC METABOLIC PANEL
Anion gap: 9 (ref 5–15)
BUN: 29 mg/dL — AB (ref 6–20)
CHLORIDE: 103 mmol/L (ref 101–111)
CO2: 27 mmol/L (ref 22–32)
CREATININE: 1.32 mg/dL — AB (ref 0.44–1.00)
Calcium: 9.5 mg/dL (ref 8.9–10.3)
GFR calc Af Amer: 44 mL/min — ABNORMAL LOW (ref >60)
GFR, EST NON AFRICAN AMERICAN: 38 mL/min — AB (ref >60)
Glucose, Bld: 86 mg/dL (ref 65–99)
Potassium: 4.5 mmol/L (ref 3.5–5.1)
SODIUM: 139 mmol/L (ref 135–145)

## 2016-06-28 ENCOUNTER — Ambulatory Visit (HOSPITAL_COMMUNITY): Payer: Medicare Other | Admitting: Registered Nurse

## 2016-06-28 ENCOUNTER — Ambulatory Visit (HOSPITAL_COMMUNITY)
Admission: RE | Admit: 2016-06-28 | Discharge: 2016-06-29 | Disposition: A | Discharge status: Home / Self Care | Payer: Medicare Other | Source: Ambulatory Visit | Attending: General Surgery | Admitting: General Surgery

## 2016-06-28 ENCOUNTER — Encounter (HOSPITAL_COMMUNITY): Payer: Self-pay | Admitting: *Deleted

## 2016-06-28 ENCOUNTER — Encounter (HOSPITAL_COMMUNITY)
Admission: RE | Disposition: A | Discharge status: Home / Self Care | Payer: Self-pay | Source: Ambulatory Visit | Attending: General Surgery

## 2016-06-28 DIAGNOSIS — Z8673 Personal history of transient ischemic attack (TIA), and cerebral infarction without residual deficits: Secondary | ICD-10-CM | POA: Insufficient documentation

## 2016-06-28 DIAGNOSIS — Z833 Family history of diabetes mellitus: Secondary | ICD-10-CM | POA: Insufficient documentation

## 2016-06-28 DIAGNOSIS — E079 Disorder of thyroid, unspecified: Secondary | ICD-10-CM | POA: Insufficient documentation

## 2016-06-28 DIAGNOSIS — Z87891 Personal history of nicotine dependence: Secondary | ICD-10-CM | POA: Insufficient documentation

## 2016-06-28 DIAGNOSIS — I2510 Atherosclerotic heart disease of native coronary artery without angina pectoris: Secondary | ICD-10-CM | POA: Insufficient documentation

## 2016-06-28 DIAGNOSIS — Z8249 Family history of ischemic heart disease and other diseases of the circulatory system: Secondary | ICD-10-CM | POA: Insufficient documentation

## 2016-06-28 DIAGNOSIS — M1990 Unspecified osteoarthritis, unspecified site: Secondary | ICD-10-CM | POA: Insufficient documentation

## 2016-06-28 DIAGNOSIS — Z79899 Other long term (current) drug therapy: Secondary | ICD-10-CM | POA: Insufficient documentation

## 2016-06-28 DIAGNOSIS — K388 Other specified diseases of appendix: Secondary | ICD-10-CM | POA: Insufficient documentation

## 2016-06-28 DIAGNOSIS — K635 Polyp of colon: Secondary | ICD-10-CM | POA: Diagnosis present

## 2016-06-28 DIAGNOSIS — D120 Benign neoplasm of cecum: Secondary | ICD-10-CM | POA: Diagnosis not present

## 2016-06-28 DIAGNOSIS — Z9081 Acquired absence of spleen: Secondary | ICD-10-CM | POA: Insufficient documentation

## 2016-06-28 DIAGNOSIS — I10 Essential (primary) hypertension: Secondary | ICD-10-CM | POA: Insufficient documentation

## 2016-06-28 DIAGNOSIS — Z823 Family history of stroke: Secondary | ICD-10-CM | POA: Insufficient documentation

## 2016-06-28 DIAGNOSIS — Z7901 Long term (current) use of anticoagulants: Secondary | ICD-10-CM | POA: Insufficient documentation

## 2016-06-28 DIAGNOSIS — K660 Peritoneal adhesions (postprocedural) (postinfection): Secondary | ICD-10-CM | POA: Diagnosis not present

## 2016-06-28 DIAGNOSIS — E7800 Pure hypercholesterolemia, unspecified: Secondary | ICD-10-CM | POA: Insufficient documentation

## 2016-06-28 DIAGNOSIS — Z9889 Other specified postprocedural states: Secondary | ICD-10-CM | POA: Insufficient documentation

## 2016-06-28 DIAGNOSIS — Z951 Presence of aortocoronary bypass graft: Secondary | ICD-10-CM | POA: Insufficient documentation

## 2016-06-28 DIAGNOSIS — J449 Chronic obstructive pulmonary disease, unspecified: Secondary | ICD-10-CM | POA: Insufficient documentation

## 2016-06-28 DIAGNOSIS — Z8261 Family history of arthritis: Secondary | ICD-10-CM | POA: Insufficient documentation

## 2016-06-28 DIAGNOSIS — M069 Rheumatoid arthritis, unspecified: Secondary | ICD-10-CM | POA: Insufficient documentation

## 2016-06-28 DIAGNOSIS — I739 Peripheral vascular disease, unspecified: Secondary | ICD-10-CM | POA: Insufficient documentation

## 2016-06-28 HISTORY — PX: LAPAROSCOPIC APPENDECTOMY: SHX408

## 2016-06-28 SURGERY — APPENDECTOMY, LAPAROSCOPIC
Anesthesia: General

## 2016-06-28 MED ORDER — FENTANYL CITRATE (PF) 100 MCG/2ML IJ SOLN
INTRAMUSCULAR | Status: DC | PRN
  Administered 2016-06-28 (×3): 50 ug via INTRAVENOUS

## 2016-06-28 MED ORDER — DEXAMETHASONE SODIUM PHOSPHATE 10 MG/ML IJ SOLN
INTRAMUSCULAR | Status: DC | PRN
  Administered 2016-06-28: 10 mg via INTRAVENOUS

## 2016-06-28 MED ORDER — BUPIVACAINE-EPINEPHRINE (PF) 0.5% -1:200000 IJ SOLN
INTRAMUSCULAR | Status: AC
  Filled 2016-06-28: qty 30

## 2016-06-28 MED ORDER — ACETAMINOPHEN 325 MG PO TABS: 650.0000 mg | ORAL_TABLET | Freq: Four times a day (QID) | ORAL | Status: DC | PRN

## 2016-06-28 MED ORDER — CEFOTETAN DISODIUM-DEXTROSE 2-2.08 GM-% IV SOLR
2.0000 g | INTRAVENOUS | Status: AC
  Administered 2016-06-28: 2 g via INTRAVENOUS
  Filled 2016-06-28: qty 50

## 2016-06-28 MED ORDER — ALBUTEROL SULFATE (2.5 MG/3ML) 0.083% IN NEBU: 2.5000 mg | INHALATION_SOLUTION | Freq: Four times a day (QID) | RESPIRATORY_TRACT | Status: DC | PRN

## 2016-06-28 MED ORDER — TRAMADOL HCL 50 MG PO TABS: 50.0000 mg | ORAL_TABLET | Freq: Four times a day (QID) | ORAL | Status: DC | PRN

## 2016-06-28 MED ORDER — ALIGN 4 MG PO CAPS: 1.0000 | ORAL_CAPSULE | Freq: Every day | ORAL | Status: DC

## 2016-06-28 MED ORDER — ACETAMINOPHEN 650 MG RE SUPP: 650.0000 mg | Freq: Four times a day (QID) | RECTAL | Status: DC | PRN

## 2016-06-28 MED ORDER — METOPROLOL SUCCINATE ER 25 MG PO TB24
25.0000 mg | ORAL_TABLET | Freq: Every day | ORAL | Status: DC
  Administered 2016-06-29: 25 mg via ORAL
  Filled 2016-06-28: qty 1

## 2016-06-28 MED ORDER — EPHEDRINE SULFATE-NACL 50-0.9 MG/10ML-% IV SOSY
PREFILLED_SYRINGE | INTRAVENOUS | Status: DC | PRN
  Administered 2016-06-28: 10 mg via INTRAVENOUS

## 2016-06-28 MED ORDER — MONTELUKAST SODIUM 10 MG PO TABS
10.0000 mg | ORAL_TABLET | Freq: Every day | ORAL | Status: DC
  Administered 2016-06-28: 10 mg via ORAL
  Filled 2016-06-28: qty 1

## 2016-06-28 MED ORDER — LOPERAMIDE HCL 2 MG PO CAPS: 2.0000 mg | ORAL_CAPSULE | Freq: Four times a day (QID) | ORAL | Status: DC | PRN

## 2016-06-28 MED ORDER — LIDOCAINE 2% (20 MG/ML) 5 ML SYRINGE
INTRAMUSCULAR | Status: AC
  Filled 2016-06-28: qty 5

## 2016-06-28 MED ORDER — PANTOPRAZOLE SODIUM 40 MG PO TBEC
40.0000 mg | DELAYED_RELEASE_TABLET | Freq: Every day | ORAL | Status: DC
  Administered 2016-06-29: 40 mg via ORAL
  Filled 2016-06-28: qty 1

## 2016-06-28 MED ORDER — ACETAMINOPHEN 500 MG PO TABS
1000.0000 mg | ORAL_TABLET | ORAL | Status: AC
  Administered 2016-06-28: 1000 mg via ORAL
  Filled 2016-06-28: qty 2

## 2016-06-28 MED ORDER — SIMETHICONE 80 MG PO CHEW: 40.0000 mg | CHEWABLE_TABLET | Freq: Four times a day (QID) | ORAL | Status: DC | PRN

## 2016-06-28 MED ORDER — ACETAMINOPHEN 500 MG PO TABS: 500.0000 mg | ORAL_TABLET | Freq: Four times a day (QID) | ORAL | Status: DC | PRN

## 2016-06-28 MED ORDER — LOPERAMIDE HCL 2 MG PO TABS: 2.0000 mg | ORAL_TABLET | Freq: Four times a day (QID) | ORAL | Status: DC | PRN

## 2016-06-28 MED ORDER — AZELASTINE-FLUTICASONE 137-50 MCG/ACT NA SUSP: 1.0000 | Freq: Two times a day (BID) | NASAL | Status: DC

## 2016-06-28 MED ORDER — ALBUTEROL SULFATE HFA 108 (90 BASE) MCG/ACT IN AERS: 2.0000 | INHALATION_SPRAY | Freq: Four times a day (QID) | RESPIRATORY_TRACT | Status: DC | PRN

## 2016-06-28 MED ORDER — LEFLUNOMIDE 20 MG PO TABS
20.0000 mg | ORAL_TABLET | ORAL | Status: DC
  Administered 2016-06-29: 20 mg via ORAL
  Filled 2016-06-28: qty 1

## 2016-06-28 MED ORDER — ONDANSETRON HCL 4 MG/2ML IJ SOLN: 4.0000 mg | Freq: Four times a day (QID) | INTRAMUSCULAR | Status: DC | PRN

## 2016-06-28 MED ORDER — ONDANSETRON HCL 4 MG/2ML IJ SOLN
INTRAMUSCULAR | Status: AC
  Filled 2016-06-28: qty 2

## 2016-06-28 MED ORDER — ROCURONIUM BROMIDE 10 MG/ML (PF) SYRINGE
PREFILLED_SYRINGE | INTRAVENOUS | Status: DC | PRN
  Administered 2016-06-28: 40 mg via INTRAVENOUS

## 2016-06-28 MED ORDER — EPHEDRINE 5 MG/ML INJ
INTRAVENOUS | Status: AC
  Filled 2016-06-28: qty 10

## 2016-06-28 MED ORDER — PROPOFOL 10 MG/ML IV BOLUS
INTRAVENOUS | Status: AC
  Filled 2016-06-28: qty 20

## 2016-06-28 MED ORDER — DEXAMETHASONE SODIUM PHOSPHATE 10 MG/ML IJ SOLN
INTRAMUSCULAR | Status: AC
  Filled 2016-06-28: qty 1

## 2016-06-28 MED ORDER — LACTATED RINGERS IV SOLN
INTRAVENOUS | Status: DC
  Administered 2016-06-28: 11:00:00 via INTRAVENOUS

## 2016-06-28 MED ORDER — FENTANYL CITRATE (PF) 100 MCG/2ML IJ SOLN
INTRAMUSCULAR | Status: AC
  Filled 2016-06-28: qty 2

## 2016-06-28 MED ORDER — BISACODYL 10 MG RE SUPP: 10.0000 mg | Freq: Every day | RECTAL | Status: DC | PRN

## 2016-06-28 MED ORDER — ONDANSETRON HCL 4 MG/2ML IJ SOLN
INTRAMUSCULAR | Status: DC | PRN
  Administered 2016-06-28: 4 mg via INTRAVENOUS

## 2016-06-28 MED ORDER — ATORVASTATIN CALCIUM 40 MG PO TABS
80.0000 mg | ORAL_TABLET | Freq: Every day | ORAL | Status: DC
  Filled 2016-06-28: qty 2

## 2016-06-28 MED ORDER — KCL IN DEXTROSE-NACL 20-5-0.45 MEQ/L-%-% IV SOLN
INTRAVENOUS | Status: DC
Start: 2016-06-28 — End: 2016-06-29
  Administered 2016-06-28: 16:00:00 via INTRAVENOUS
  Filled 2016-06-28 (×2): qty 1000

## 2016-06-28 MED ORDER — LIDOCAINE 2% (20 MG/ML) 5 ML SYRINGE
INTRAMUSCULAR | Status: DC | PRN
  Administered 2016-06-28: 80 mg via INTRAVENOUS

## 2016-06-28 MED ORDER — PROMETHAZINE HCL 25 MG/ML IJ SOLN: 6.2500 mg | INTRAMUSCULAR | Status: DC | PRN

## 2016-06-28 MED ORDER — RISAQUAD PO CAPS
1.0000 | ORAL_CAPSULE | Freq: Every day | ORAL | Status: DC
  Administered 2016-06-29: 1 via ORAL
  Filled 2016-06-28: qty 1

## 2016-06-28 MED ORDER — SUGAMMADEX SODIUM 200 MG/2ML IV SOLN
INTRAVENOUS | Status: AC
  Filled 2016-06-28: qty 2

## 2016-06-28 MED ORDER — BUPIVACAINE-EPINEPHRINE 0.5% -1:200000 IJ SOLN
INTRAMUSCULAR | Status: DC | PRN
  Administered 2016-06-28: 25 mL

## 2016-06-28 MED ORDER — FAMOTIDINE 20 MG PO TABS
20.0000 mg | ORAL_TABLET | Freq: Every day | ORAL | Status: DC
  Administered 2016-06-28: 20 mg via ORAL
  Filled 2016-06-28: qty 1

## 2016-06-28 MED ORDER — SUGAMMADEX SODIUM 200 MG/2ML IV SOLN
INTRAVENOUS | Status: DC | PRN
  Administered 2016-06-28: 200 mg via INTRAVENOUS

## 2016-06-28 MED ORDER — PROPOFOL 10 MG/ML IV BOLUS
INTRAVENOUS | Status: DC | PRN
  Administered 2016-06-28: 20 mg via INTRAVENOUS
  Administered 2016-06-28: 120 mg via INTRAVENOUS

## 2016-06-28 MED ORDER — LEVOTHYROXINE SODIUM 88 MCG PO TABS
88.0000 ug | ORAL_TABLET | Freq: Every day | ORAL | Status: DC
  Administered 2016-06-29: 88 ug via ORAL
  Filled 2016-06-28: qty 1

## 2016-06-28 MED ORDER — ONDANSETRON 4 MG PO TBDP: 4.0000 mg | ORAL_TABLET | Freq: Four times a day (QID) | ORAL | Status: DC | PRN

## 2016-06-28 MED ORDER — LOSARTAN POTASSIUM 25 MG PO TABS
25.0000 mg | ORAL_TABLET | Freq: Every day | ORAL | Status: DC
  Administered 2016-06-28 – 2016-06-29 (×2): 25 mg via ORAL
  Filled 2016-06-28 (×2): qty 1

## 2016-06-28 MED ORDER — LOSARTAN POTASSIUM 25 MG PO TABS: 25.0000 mg | ORAL_TABLET | Freq: Every day | ORAL | Status: DC

## 2016-06-28 MED ORDER — FENTANYL CITRATE (PF) 100 MCG/2ML IJ SOLN: 25.0000 ug | INTRAMUSCULAR | Status: DC | PRN

## 2016-06-28 MED ORDER — ENOXAPARIN SODIUM 40 MG/0.4ML ~~LOC~~ SOLN
40.0000 mg | SUBCUTANEOUS | Status: DC
  Filled 2016-06-28: qty 0.4

## 2016-06-28 MED ORDER — MORPHINE SULFATE (PF) 4 MG/ML IV SOLN: 2.0000 mg | INTRAVENOUS | Status: DC | PRN

## 2016-06-28 SURGICAL SUPPLY — 43 items
ADH SKN CLS APL DERMABOND .7 (GAUZE/BANDAGES/DRESSINGS) ×1
APPLIER CLIP 5 13 M/L LIGAMAX5 (MISCELLANEOUS)
APPLIER CLIP ROT 10 11.4 M/L (STAPLE)
APR CLP MED LRG 11.4X10 (STAPLE)
APR CLP MED LRG 5 ANG JAW (MISCELLANEOUS)
BAG SPEC RTRVL LRG 6X4 10 (ENDOMECHANICALS) ×1
CABLE HIGH FREQUENCY MONO STRZ (ELECTRODE) ×3 IMPLANT
CHLORAPREP W/TINT 26ML (MISCELLANEOUS) ×3 IMPLANT
CLIP APPLIE 5 13 M/L LIGAMAX5 (MISCELLANEOUS) IMPLANT
CLIP APPLIE ROT 10 11.4 M/L (STAPLE) IMPLANT
COVER SURGICAL LIGHT HANDLE (MISCELLANEOUS) ×3 IMPLANT
DECANTER SPIKE VIAL GLASS SM (MISCELLANEOUS) ×3 IMPLANT
DERMABOND ADVANCED (GAUZE/BANDAGES/DRESSINGS) ×2
DERMABOND ADVANCED .7 DNX12 (GAUZE/BANDAGES/DRESSINGS) ×1 IMPLANT
DRAPE LAPAROSCOPIC ABDOMINAL (DRAPES) ×3 IMPLANT
ELECT REM PT RETURN 9FT ADLT (ELECTROSURGICAL) ×3
ELECTRODE REM PT RTRN 9FT ADLT (ELECTROSURGICAL) ×1 IMPLANT
GLOVE BIO SURGEON STRL SZ 6.5 (GLOVE) ×2 IMPLANT
GLOVE BIO SURGEONS STRL SZ 6.5 (GLOVE) ×1
GLOVE BIOGEL PI IND STRL 7.0 (GLOVE) ×1 IMPLANT
GLOVE BIOGEL PI INDICATOR 7.0 (GLOVE) ×2
GOWN STRL REUS W/TWL 2XL LVL3 (GOWN DISPOSABLE) ×3 IMPLANT
GOWN STRL REUS W/TWL XL LVL3 (GOWN DISPOSABLE) ×3 IMPLANT
GRASPER SUT TROCAR 14GX15 (MISCELLANEOUS) IMPLANT
HANDLE STAPLE EGIA 4 XL (STAPLE) ×2 IMPLANT
IRRIG SUCT STRYKERFLOW 2 WTIP (MISCELLANEOUS) ×3
IRRIGATION SUCT STRKRFLW 2 WTP (MISCELLANEOUS) ×1 IMPLANT
KIT BASIN OR (CUSTOM PROCEDURE TRAY) ×3 IMPLANT
MARKER SKIN DUAL TIP RULER LAB (MISCELLANEOUS) ×3 IMPLANT
POUCH SPECIMEN RETRIEVAL 10MM (ENDOMECHANICALS) ×3 IMPLANT
RELOAD EGIA 45 MED/THCK PURPLE (STAPLE) IMPLANT
RELOAD EGIA 45 TAN VASC (STAPLE) ×2 IMPLANT
RELOAD EGIA 60 MED/THCK PURPLE (STAPLE) ×3 IMPLANT
RELOAD EGIA 60 TAN VASC (STAPLE) IMPLANT
RELOAD STAPLE 60 MED/THCK ART (STAPLE) IMPLANT
SCISSORS LAP 5X35 DISP (ENDOMECHANICALS) ×3 IMPLANT
SLEEVE XCEL OPT CAN 5 100 (ENDOMECHANICALS) IMPLANT
SUT VIC AB 4-0 PS2 27 (SUTURE) ×3 IMPLANT
TOWEL OR 17X26 10 PK STRL BLUE (TOWEL DISPOSABLE) ×3 IMPLANT
TRAY FOLEY W/METER SILVER 16FR (SET/KITS/TRAYS/PACK) ×3 IMPLANT
TRAY LAPAROSCOPIC (CUSTOM PROCEDURE TRAY) ×3 IMPLANT
TROCAR BLADELESS OPT 5 100 (ENDOMECHANICALS) IMPLANT
TROCAR XCEL BLUNT TIP 100MML (ENDOMECHANICALS) ×3 IMPLANT

## 2016-06-28 NOTE — Anesthesia Preprocedure Evaluation (Addendum)
Anesthesia Evaluation  Patient identified by MRN, date of birth, ID band Patient awake    Reviewed: Allergy & Precautions, NPO status , Patient's Chart, lab work & pertinent test results  Airway Mallampati: III  TM Distance: <3 FB Neck ROM: Full    Dental no notable dental hx.    Pulmonary COPD,  COPD inhaler, former smoker,    Pulmonary exam normal breath sounds clear to auscultation       Cardiovascular hypertension, + CAD, + CABG and + Peripheral Vascular Disease  Normal cardiovascular exam Rhythm:Regular Rate:Normal     Neuro/Psych TIAnegative psych ROS   GI/Hepatic negative GI ROS, Neg liver ROS,   Endo/Other  negative endocrine ROS  Renal/GU negative Renal ROS  negative genitourinary   Musculoskeletal  (+) Arthritis , Rheumatoid disorders,    Abdominal   Peds negative pediatric ROS (+)  Hematology negative hematology ROS (+)   Anesthesia Other Findings   Reproductive/Obstetrics negative OB ROS                            Anesthesia Physical Anesthesia Plan  ASA: III  Anesthesia Plan: General   Post-op Pain Management:    Induction: Intravenous  Airway Management Planned: Oral ETT  Additional Equipment:   Intra-op Plan:   Post-operative Plan: Extubation in OR  Informed Consent: I have reviewed the patients History and Physical, chart, labs and discussed the procedure including the risks, benefits and alternatives for the proposed anesthesia with the patient or authorized representative who has indicated his/her understanding and acceptance.   Dental advisory given  Plan Discussed with: CRNA and Surgeon  Anesthesia Plan Comments:         Anesthesia Quick Evaluation

## 2016-06-28 NOTE — H&P (Signed)
History of Present Illness  The patient is a 76 year old female who presents with a colonic polyp. 76 year old female who underwent a recent colonoscopy by Dr. Havery Moros. She had 1 polyp that was noted to be in the appendiceal orifice. This was biopsied but not felt to be removed completely. Patient has a past surgical history significant for splenectomy and open abdominal aortic aneurysm repair. She also has coronary artery disease status post CABG and is on Plavix for these conditions. She underwent a lung surgery in November 2016. Her most recent chest CT was stable.    Past Surgical History Aneurysm Repair  Bypass Surgery for Poor Blood Flow to Legs  Cataract Surgery  Bilateral. Colon Polyp Removal - Colonoscopy  Coronary Artery Bypass Graft  Lung Surgery  Right. Spinal Surgery - Neck   Diagnostic Studies History  Mammogram  within last year Pap Smear  1-5 years ago  Allergies  Aspirin *ANALGESICS - NonNarcotic*  Codeine Sulfate *ANALGESICS - OPIOID*  Nausea, Vomiting. Nitroglycerin *ANTIANGINAL AGENTS*   Medication History  Promethazine-Codeine (6.25-10MG /5ML Syrup, Oral) Active. Anoro Ellipta (62.5-25MCG/INH Aero Pow Br Act, Inhalation) Active. Atorvastatin Calcium (80MG  Tablet, Oral) Active. Levothyroxine Sodium (88MCG Tablet, Oral) Active. Losartan Potassium (25MG  Tablet, Oral) Active. Mucinex (600MG  Tablet ER 12HR, Oral) Active. Metoprolol Succinate ER (25MG  Tablet ER 24HR, Oral) Active. Leflunomide (20MG  Tablet, Oral) Active. Fluticasone Propionate (50MCG/ACT Suspension, Nasal) Active. Clopidogrel Bisulfate (75MG  Tablet, Oral) Active. Tylenol (325MG  Tablet, Oral as needed) Active. Biotin (10MG  Capsule, Oral) Active. ZyrTEC Allergy (10MG  Capsule, Oral) Active. Imodium A-D (2MG  Capsule, Oral) Active. Probiotic Formula (Oral) Active. Metamucil (48.57% Powder, Oral) Active. Medications Reconciled  Social History  Alcohol use   Recently quit alcohol use. Caffeine use  Coffee, Tea. No drug use  Tobacco use  Former smoker.  Family History  Arthritis  Father, Mother, Sister. Cerebrovascular Accident  Father. Diabetes Mellitus  Mother. Heart Disease  Mother. Hypertension  Mother.  Pregnancy / Birth History  Age at menarche  41 years. Age of menopause  >58 Contraceptive History  Oral contraceptives. Gravida  5 Length (months) of breastfeeding  3-6 Maternal age  75-25 Para  19  Other Problems  Arthritis  Cerebrovascular Accident  Chronic Obstructive Lung Disease  Hemorrhoids  High blood pressure  Hypercholesterolemia  Inguinal Hernia  Thyroid Disease  Vascular Disease     Review of Systems  General Present- Appetite Loss, Fatigue and Weight Loss. Not Present- Chills, Fever, Night Sweats and Weight Gain. Skin Not Present- Change in Wart/Mole, Dryness, Hives, Jaundice, New Lesions, Non-Healing Wounds, Rash and Ulcer. HEENT Present- Ringing in the Ears, Visual Disturbances and Wears glasses/contact lenses. Not Present- Earache, Hearing Loss, Hoarseness, Nose Bleed, Oral Ulcers, Seasonal Allergies, Sinus Pain, Sore Throat and Yellow Eyes. Respiratory Present- Chronic Cough. Not Present- Bloody sputum, Difficulty Breathing, Snoring and Wheezing. Breast Not Present- Breast Mass, Breast Pain, Nipple Discharge and Skin Changes. Cardiovascular Not Present- Chest Pain, Difficulty Breathing Lying Down, Leg Cramps, Palpitations, Rapid Heart Rate, Shortness of Breath and Swelling of Extremities. Gastrointestinal Present- Chronic diarrhea and Hemorrhoids. Not Present- Abdominal Pain, Bloating, Bloody Stool, Change in Bowel Habits, Constipation, Difficulty Swallowing, Excessive gas, Gets full quickly at meals, Indigestion, Nausea, Rectal Pain and Vomiting. Female Genitourinary Not Present- Frequency, Nocturia, Painful Urination, Pelvic Pain and Urgency. Musculoskeletal Present- Joint Stiffness.  Not Present- Back Pain, Joint Pain, Muscle Pain, Muscle Weakness and Swelling of Extremities. Neurological Not Present- Decreased Memory, Fainting, Headaches, Numbness, Seizures, Tingling, Tremor, Trouble walking and Weakness. Psychiatric  Not Present- Anxiety, Bipolar, Change in Sleep Pattern, Depression, Fearful and Frequent crying. Endocrine Present- Hot flashes. Not Present- Cold Intolerance, Excessive Hunger, Hair Changes, Heat Intolerance and New Diabetes. Hematology Not Present- Blood Thinners, Easy Bruising, Excessive bleeding, Gland problems, HIV and Persistent Infections.  BP (!) 116/56   Pulse 92   Temp 97.8 F (36.6 C) (Oral)   Resp 16   Ht 5\' 2"  (1.575 m)   Wt 68.5 kg (151 lb)   SpO2 97%   BMI 27.62 kg/m     Physical Exam  General Mental Status-Alert. General Appearance-Not in acute distress. Build & Nutrition-Well nourished. Posture-Normal posture. Gait-Normal.  Head and Neck Head-normocephalic, atraumatic with no lesions or palpable masses. Trachea-midline.  Chest and Lung Exam Chest and lung exam reveals -on auscultation, normal breath sounds, no adventitious sounds and normal vocal resonance.  Cardiovascular Cardiovascular examination reveals -normal heart sounds, regular rate and rhythm with no murmurs and no digital clubbing, cyanosis, edema, increased warmth or tenderness.  Abdomen Inspection Inspection of the abdomen reveals - No Hernias. Skin - Scar - Note: Upper midline scar. Palpation/Percussion Palpation and Percussion of the abdomen reveal - Soft, Non Tender, No Rigidity (guarding), No hepatosplenomegaly and No Palpable abdominal masses.  Neurologic Neurologic evaluation reveals -alert and oriented x 3 with no impairment of recent or remote memory, normal attention span and ability to concentrate, normal sensation and normal coordination.  Musculoskeletal Normal Exam - Bilateral-Upper Extremity Strength Normal and Lower  Extremity Strength Normal.    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 This was seen on recent colonoscopy and appeared to be invading into the appendiceal orifice. Biopsy showed sessile serrated adenoma. She  Has completed a workup of a lung mass.  She is ready for laparoscopic appendectomy. She does have a history of an open abdominal aortic aneurysm repair, but this appears to be mainly in her upper abdomen. Patient is on Plavix and has held this prior to surgery.  Risks include bleeding, leak of resection site and inability to identify pathology.

## 2016-06-28 NOTE — Transfer of Care (Signed)
Immediate Anesthesia Transfer of Care Note  Patient: [name]  Procedure(s) Performed: Procedure(s): APPENDECTOMY LAPAROSCOPIC (N/A)  Patient Location: PACU  Anesthesia Type:General  Level of Consciousness: awake, alert , oriented and patient cooperative  Airway & Oxygen Therapy: Patient Spontanous Breathing and Patient connected to face mask oxygen  Post-op Assessment: Report given to RN, Post -op Vital signs reviewed and stable and Patient moving all extremities  Post vital signs: Reviewed and stable  Last Vitals:  Vitals:   06/28/16 1001  BP: (!) 116/56  Pulse: 92  Resp: 16  Temp: 36.6 C    Last Pain:  Vitals:   06/28/16 1001  TempSrc: Oral         Complications: No apparent anesthesia complications

## 2016-06-28 NOTE — Progress Notes (Signed)
Dr Ephriam Jenkins office called and notified triage nurse of elevated  BP. Advised to have patient take BP meds as at home and to call if systolic is greater than 793.

## 2016-06-28 NOTE — Op Note (Signed)
ARLETHA MARSCHKE [id number]   PRE-OPERATIVE DIAGNOSIS:  Colon Polyp  POST-OPERATIVE DIAGNOSIS:  Appendiceal Polyp   Procedure(s): APPENDECTOMY LAPAROSCOPIC   Surgeon(s): Leighton Ruff, MD  ASSISTANT: none   ANESTHESIA:   local and general  EBL:   39ml  Delay start of Pharmacological VTE agent (>24hrs) due to surgical blood loss or risk of bleeding:  no  DRAINS: none   SPECIMEN:  Source of Specimen:  appendix  DISPOSITION OF SPECIMEN:  PATHOLOGY  COUNTS:  YES  PLAN OF CARE: Admit for overnight observation  PATIENT DISPOSITION:  PACU - hemodynamically stable.   INDICATIONS: Patient with concerning symptoms & work up suspicious for appendicitis.  Surgery was recommended:  The anatomy & physiology of the digestive tract was discussed.  The pathophysiology of appendicitis was discussed.  Natural history risks without surgery was discussed.   I feel the risks of no intervention will lead to serious problems that outweigh the operative risks; therefore, I recommended diagnostic laparoscopy with removal of appendix to remove the pathology.  Laparoscopic & open techniques were discussed.   I noted a good likelihood this will help address the problem.    Risks such as bleeding, infection, abscess, leak, reoperation, possible ostomy, hernia, heart attack, death, and other risks were discussed.  Goals of post-operative recovery were discussed as well.  We will work to minimize complications.  Questions were answered.  The patient expresses understanding & wishes to proceed with surgery.  OR FINDINGS: normal appearing appendix, upper abdominal adhesions  DESCRIPTION:   The patient was identified & brought into the operating room. The patient was positioned supine with left arm tucked. SCDs were active during the entire case. The patient underwent general anesthesia without any difficulty.  A foley catheter was inserted under sterile conditions. The abdomen was prepped and draped in a  sterile fashion. A Surgical Timeout confirmed our plan.   I made a vertical incision through the inferior umbilical fold.  I made a nick in the infraumbilical fascia and confirmed peritoneal entry.  I placed a stay suture and then the [facility] port.  We induced carbon dioxide insufflation.  Camera inspection revealed no injury.  I placed additional ports under direct laparoscopic visualization.  I mobilized the terminal ileum to proximal ascending colon in a lateral to medial fashion.  I took care to avoid injuring any retroperitoneal structures.   I freed the appendix off its attachments to the ascending colon and cecal mesentery.  I elevated the appendix.  I was able to free off the base of the appendix.  I stapled the appendix off the cecum using a laparoscopic bowel load stapler.  I took a healthy cuff viable cecum. I ligated the mesoappendix with a vascular load stapler.   I placed the appendix inside an EndoCatch bag and removed out the [place] port.  Hemostasis was good in the mesoappendix, colon mesentery, and retroperitoneum. Staple line was intact on the cecum with no bleeding. I washed out the pelvis, retrohepatic space and right paracolic gutter.  Hemostasis is good. There was no perforation or injury.    I aspirated the carbon dioxide. I removed the ports. I closed the umbilical fascia site using a 0 Vicryl stitch. I closed skin using 4-0 vicryl stitch.  Sterile dressings were applied.  Patient was extubated and sent to the recovery room.  I discussed the operative findings with the patient's  family. I suspect the patient is going used in the hospital at least overnight. Questions answered. They expressed  understanding and appreciation.

## 2016-06-28 NOTE — Anesthesia Procedure Notes (Signed)
Procedure Name: Intubation Date/Time: 06/28/2016 12:19 PM Performed by: Carleene Cooper A Pre-anesthesia Checklist: Patient identified, Emergency Drugs available, Suction available, Patient being monitored and Timeout performed Patient Re-evaluated:Patient Re-evaluated prior to inductionOxygen Delivery Method: Circle system utilized Preoxygenation: Pre-oxygenation with 100% oxygen Intubation Type: IV induction Ventilation: Mask ventilation without difficulty Laryngoscope Size: Mac and 4 Grade View: Grade II Tube type: Oral Tube size: 7.0 mm Number of attempts: 2 Airway Equipment and Method: Bougie stylet Placement Confirmation: ETT inserted through vocal cords under direct vision,  positive ETCO2 and breath sounds checked- equal and bilateral Secured at: 21 cm Tube secured with: Tape Dental Injury: Teeth and Oropharynx as per pre-operative assessment  Comments: DL x 1 by CRNA with MAC 4. Grade 3 view. -ETCO2. Easy mask. VSS. DL x 1 by Dr. Kalman Shan with MAC 4. Grade 2-3 view. Boogie passed. + ETCO2. BBS=. ATOI.

## 2016-06-28 NOTE — Anesthesia Postprocedure Evaluation (Addendum)
Anesthesia Post Note  Patient: [name]  Procedure(s) Performed: Procedure(s) (LRB): APPENDECTOMY LAPAROSCOPIC (N/A)  Patient location during evaluation: PACU Anesthesia Type: General Level of consciousness: awake and alert Pain management: pain level controlled Vital Signs Assessment: post-procedure vital signs reviewed and stable Respiratory status: spontaneous breathing, nonlabored ventilation, respiratory function stable and patient connected to nasal cannula oxygen Cardiovascular status: blood pressure returned to baseline and stable Postop Assessment: no signs of nausea or vomiting Anesthetic complications: no       Last Vitals:  Vitals:   06/28/16 1400 06/28/16 1415  BP: (!) 182/75   Pulse: 72   Resp: 12   Temp:  36.3 C    Last Pain:  Vitals:   06/28/16 1415  TempSrc:   PainSc: 0-No pain                 [name] S

## 2016-06-29 ENCOUNTER — Encounter (HOSPITAL_COMMUNITY): Payer: Self-pay | Admitting: General Surgery

## 2016-06-29 DIAGNOSIS — K388 Other specified diseases of appendix: Secondary | ICD-10-CM | POA: Diagnosis not present

## 2016-06-29 NOTE — Discharge Summary (Signed)
Patient ID: JENESE MISCHKE [id number] 75 y.o. Nov 12, 1940  06/28/2016  Discharge date and time: No discharge date for patient encounter.  Admitting Physician: Rosario Adie  Discharge Physician: Rosario Adie.  Admission Diagnoses: Colon Polyp  Discharge Diagnoses: appendiceal polyp  Operations: Procedure(s): APPENDECTOMY LAPAROSCOPIC    Discharged Condition: good    Hospital Course: Patient did well overnight.  Having min pain and tolerating a diet.  Consults: None  Significant Diagnostic Studies: none  Treatments: surgery: lap appy  Disposition: Home

## 2016-06-29 NOTE — Discharge Planning (Signed)
Patient discharged home with husband.  Discharge instructions reviewed and patient verbalized understanding.  Operative sites CD&I with no signs or symptoms of infection.  Discharged home with no complaints.

## 2016-06-29 NOTE — Discharge Instructions (Signed)
LAPAROSCOPIC SURGERY: POST OP INSTRUCTIONS  1. DIET: Follow a light bland diet the first 24 hours after arrival home, such as soup, liquids, crackers, etc.  Be sure to include lots of fluids daily.  Avoid fast food or heavy meals as your are more likely to get nauseated.  Eat a low fat the next few days after surgery.   2. Take your usually prescribed home medications unless otherwise directed. 3. PAIN CONTROL: a. Pain is best controlled by a usual combination of three different methods TOGETHER: i. Ice/Heat ii. Over the counter pain medication iii. Prescription pain medication b. Most patients will experience some swelling and bruising around the incisions.  Ice packs or heating pads (30-60 minutes up to 6 times a day) will help. Use ice for the first few days to help decrease swelling and bruising, then switch to heat to help relax tight/sore spots and speed recovery.  Some people prefer to use ice alone, heat alone, alternating between ice & heat.  Experiment to what works for you.  Swelling and bruising can take several weeks to resolve.   c. It is helpful to take an over-the-counter pain medication regularly for the first few weeks.  Choose one of the following that works best for you: i. Naproxen (Aleve, etc)  Two 220mg  tabs twice a day ii. Ibuprofen (Advil, etc) Three 200mg  tabs four times a day (every meal & bedtime) d. A  prescription for pain medication (such as percocet, vicodin, oxycodone, hydrocodone, etc) should be given to you upon discharge.  Take your pain medication as prescribed.  i. If you are having problems/concerns with the prescription medicine (does not control pain, nausea, vomiting, rash, itching, etc), please call us [phone] to see if we need to switch you to a different pain medicine that will work better for you and/or control your side effect better. ii. If you need a refill on your pain medication, please contact your pharmacy.  They will contact our office to  request authorization. Prescriptions will not be filled after 5 pm or on week-ends.   4. Avoid getting constipated.  Between the surgery and the pain medications, it is common to experience some constipation.  Increasing fluid intake and taking a fiber supplement (such as Metamucil, Citrucel, FiberCon, MiraLax, etc) 1-2 times a day regularly will usually help prevent this problem from occurring.  A mild laxative (prune juice, Milk of Magnesia, MiraLax, etc) should be taken according to package directions if there are no bowel movements after 48 hours.   5. Watch out for diarrhea.  If you have many loose bowel movements, simplify your diet to bland foods & liquids for a few days.  Stop any stool softeners and decrease your fiber supplement.  Switching to mild anti-diarrheal medications (Kayopectate, Pepto Bismol) can help.  If this worsens or does not improve, please call us. 6. Wash / shower every day.  You may shower over the dressings as they are waterproof.  Continue to shower over incision(s) after the dressing is off. 7. You may leave the incision open to air.  You may replace a dressing/Band-Aid to cover the incision for comfort if you wish.  8. ACTIVITIES as tolerated:   a. You may resume regular (light) daily activities beginning the next day--such as daily self-care, walking, climbing stairs--gradually increasing activities as tolerated.  If you can walk 30 minutes without difficulty, it is safe to try more intense activity such as jogging, treadmill, bicycling, low-impact aerobics, swimming, etc. b. Save  the most intensive and strenuous activity for last such as sit-ups, heavy lifting, contact sports, etc  Refrain from any heavy lifting or straining until you are off narcotics for pain control.   c. DO NOT PUSH THROUGH PAIN.  Let pain be your guide: If it hurts to do something, don't do it.  Pain is your body warning you to avoid that activity for another week until the pain goes down. d. You  may drive when you are no longer taking prescription pain medication, you can comfortably wear a seatbelt, and you can safely maneuver your car and apply brakes. e. Dennis Bast may have sexual intercourse when it is comfortable.  9. FOLLOW UP in our office a. Please call [facility] at [phone] to set up an appointment to see your surgeon in the office for a follow-up appointment approximately 2-3 weeks after your surgery. b. Make sure that you call for this appointment the day you arrive home to insure a convenient appointment time. 10. IF YOU HAVE DISABILITY OR FAMILY LEAVE FORMS, BRING THEM TO THE OFFICE FOR PROCESSING.  DO NOT GIVE THEM TO YOUR DOCTOR.   WHEN TO CALL us [phone]: 1. Poor pain control 2. Reactions / problems with new medications (rash/itching, nausea, etc)  3. Fever over 101.5 F (38.5 C) 4. Inability to urinate 5. Nausea and/or vomiting 6. Worsening swelling or bruising 7. Continued bleeding from incision. 8. Increased pain, redness, or drainage from the incision   The clinic staff is available to answer your questions during regular business hours (8:30am-5pm).  Please dont hesitate to call and ask to speak to one of our nurses for clinical concerns.   If you have a medical emergency, go to the nearest emergency room or call 911.  A surgeon from [facility] is always on call at the [facility], [address], [place], [place], [place]  [address] ? MAIN: [phone] ? TOLL FREE: [phone] ?  FAX [phone] [email or web address]

## 2016-07-06 ENCOUNTER — Other Ambulatory Visit: Payer: Self-pay | Admitting: Internal Medicine

## 2016-08-07 ENCOUNTER — Ambulatory Visit (INDEPENDENT_AMBULATORY_CARE_PROVIDER_SITE_OTHER): Payer: Medicare Other | Admitting: Internal Medicine

## 2016-08-07 ENCOUNTER — Encounter: Payer: Self-pay | Admitting: Internal Medicine

## 2016-08-07 VITALS — BP 128/72 | HR 85 | Ht 63.0 in | Wt 146.0 lb

## 2016-08-07 DIAGNOSIS — R05 Cough: Secondary | ICD-10-CM | POA: Diagnosis not present

## 2016-08-07 DIAGNOSIS — R058 Other specified cough: Secondary | ICD-10-CM

## 2016-08-07 MED ORDER — PREDNISONE 10 MG PO TABS: ORAL_TABLET | ORAL | 0 refills | Status: DC

## 2016-08-07 MED ORDER — PANTOPRAZOLE SODIUM 40 MG PO TBEC: 40.0000 mg | DELAYED_RELEASE_TABLET | Freq: Every day | ORAL | 2 refills | Status: DC

## 2016-08-07 NOTE — Patient Instructions (Addendum)
Stop anoro and montelukast to see what difference this makes  For drainage / throat tickle try take CHLORPHENIRAMINE  4 mg - take one every 4 hours as needed - available over the counter- may cause drowsiness so start with just a bedtime dose or two and see how you tolerate it before trying in daytime     Please schedule a follow up office visit in 6 weeks, call sooner if needed  - late add:  pred x 6 days prn flare x one - consider adding gabapentin next ov

## 2016-08-07 NOTE — Progress Notes (Signed)
Subjective:   Patient ID: [name], female    DOB: [birth year]     MRN: [id number]     Brief patient profile:  31   yowf  Only smoked age 76 - 25 s obvious sequelae with new cough while on a cruise in September 2016 in [place] > cxr > ct chest > PET > referred to pulmonary clinic 02/15/2015 by [name] UC with GOLD I copd 02/19/15     History of Present Illness  02/15/2015 1st [facility] office visit/ [name]   Chief Complaint  Patient presents with  . PULMONARY CONSULT    Pulmonary nodules. Pt reports some dyspnea with exertion, some morning dry to wet cough. Pt does not use any inhalers.   Cough better but some cc am congestion x years ? Worse in winter > total ? Usually swallows so can't say color or amt assoc with mild pnds  Doe only with exertion = MMRC1 = can walk nl pace, flat grade, can't hurry or go uphills or steps s mild sob  rec Please remember to go to the lab  department downstairs for your tests - we will call you with the results when they are available. 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 Acute respiratory failure with hypoxia ([place]) 04/15/2016  . Acute respiratory failure with hypoxemia ([place]) 04/16/2016  . Essential hypertension 12/08/2006  . Chronic kidney disease 12/08/2006  . 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) 12/22/1994    Class: History of  . 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    Discharge Condition: stable  Diet recommendation: heart healthy      Filed Weights   04/15/16 1153 04/15/16 1900  Weight: 68 kg (150 lb) 67.9 kg (149 lb 12.8 oz)    History of present illness:  Chief Complaint: Coughing  HPI: Sherry Mikkelsen Hermanis a 76 y.o.femalewith medical history significant of COPD, CAD s/p CABG x1, PVD, CVA, AAA s/p repair. Symptoms started about a month ago with a nonproductive cough. Symptoms remain persistent and she went to primary care physician about 2 weeks ago. She is prescribed antibiotics at that time which helped for about 1 day, but symptoms continue to persist. She eventually came to the emergency department because her symptoms continuedto worsen. She reports no aggravating symptoms. He has mild dyspnea at rest.  ED Course: Vitals: Afebrile. Slight tachycardia to 100s. Slightly hypertensive. On 2L O2 via [place] Labs: creatinine 1.31.  Imaging: CT shows abnormal enlarging soft tissue in left upper lobe Medications/Course: Given albuterol, atrovent and solu-medrol.  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; now atretic and significantly improved LAD lesion without intervention   Acute respiratory failure with hypoxemia (HCC)   Acute respiratory failure with hypoxia with cough, failed outpatient treatment Patient report she started have cough 4weeks ago, she was seen by primary care doctor on 12/4 and on 12/16, she was treated with oral abx cedinir, however her symptom did not improve, she is sent by pmd to the hospital due to persistent symptom and she started to have tachycardia  and reduced oxygen level She presented to the hospital, initially her oxygen saturation was in the 80's, she is tachycardia in the 100's, tachypnea , her respiration rate stayed around 28 in the ED, at one point her respiration rate in the Ed got up to 45. She has mild lactic acidosis on presentation as well Initial CTA no pneumonia, no PE,  Viral panel + metapneumovirus. - supportive care and wean oxygen as able, Albuterol prn -she report did not feel any better on 12/18, she continued to cough, feeling weak.Due to significant underline immunosuppressed status, no clinical improvement and now has mild leukocytosis on 12/18 , doxycycline empirically started to prevent/treat bacterial superinfection. repeat cxr no lung infiltrate. 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 CT on admission showed: "Enlarging abnormal soft tissue in the left upper lobe at the prior surgical site concerning for recurrent tumor. This measures up to 2.2 cm. This could be further evaluated with PET CT. Stable left lower lobe pulmonary nodule. Borderline and mildly enlarged mediastinal lymph nodes. - thoracic surgery Sherry. Servando [name] consulted who recommended have PET scan and outpatient follow up with pulmonology and thoracic surgery   Hypertension -continue metoprolol -continue losartan  CAD s/p CABG -continue lipitor/plavix ( report allergic to asa)   CVA (TIA in 2016) -continue statin/Plavix  CKD stage  III Stable  Adrenal mass -stable  Hypothyroidism -continue levothyroxine    05/22/2016 extended post op  f/u ov/[name] re: acute resp failure ? Viral related  Chief Complaint  Patient presents with  . Hospitalization Follow-up    Breathing has improved but not back to her normal baseline. She is coughing less.    throat clearing x 10 years worse in daytime > never cough up excess/ purulent sputum or mucus plugs  Or blood Worse in late  November 2017 > with noct cough  referred to Sherry [name] for new  Cough worse off nasal spray  = flonase  Started pepcid ? otc hs some better  Cough resolves on prednisone every time  Doe = MMRC2 = can't walk a nl pace on a flat grade s sob but does fine slow and flat eg shopping at [employer], nothing more rec Prednisone 10 mg take  4 each am x 2 days,  2 each am x 2 days,  1 each am x 2 days and stop  Singulair 10 mg (montelukast)  at bedtime  Pantoprazole (protonix) 40 mg   Take  30-60 min before first meal of  the day and Pepcid (famotidine)  20 mg one @  bedtime until return to office - this is the best way to tell whether stomach acid is contributing to your problem.   GERD diet     06/19/2016  f/u ov/Buna Cuppett re: uacs/ pnds on singulair/ gerd rx/ flonase  Chief Complaint  Patient presents with  . Follow-up    Her breathing has improved to her normal baseline. She states having increased PND. Cough has "pretty much gone away".  She has not had to use albuterol inhaler.    no noct symptoms, during the day only of some sense of pnds gone only while on prednisone, back since stopped it  rec Stop flonase and try dymista one twice daily  For drainage / throat tickle instead of zyrtec ok to  try   CHLORPHENIRAMINE  4 mg - take one every 4 hours as needed - available over the counter- may cause drowsiness so start with just a bedtime dose or two and see how you tolerate it before trying in daytime      08/07/2016  f/u ov/Mujtaba Bollig re:  uacs on anoro/ singulair and  no better, no better with dymista, did not try h1 Chief Complaint  Patient presents with  . Follow-up    Coughing more and having more PND. Cough is non prod. She is not using her albuterol inhaler.   after stirs has  lots of nonproductive coughing assoc with sense of pnds  no better on anoro,  Not limited by breathing from desired activities    No obvious day to day or daytime variability or assoc excess/ purulent sputum or mucus plugs or hemoptysis or cp or chest tightness, subjective wheeze or overt sinus or hb symptoms. No unusual exp hx or h/o childhood pna/ asthma or knowledge of premature birth.  Sleeping ok without nocturnal  or early am exacerbation  of respiratory  c/o's or need for noct saba. Also denies any obvious fluctuation of symptoms with weather or environmental changes or other aggravating or alleviating factors except as outlined above   Current Medications, Allergies, Complete Past Medical History, Past Surgical History, Family History, and Social History were reviewed in [employer] record.  ROS  The following are not active complaints unless bolded sore throat, dysphagia, dental problems, itching, sneezing,  nasal congestion or excess/ purulent secretions, ear ache,   fever, chills, sweats, unintended wt loss, classically pleuritic or exertional cp,  orthopnea pnd or leg swelling, presyncope, palpitations, abdominal pain, anorexia, nausea, vomiting, diarrhea  or change in bowel or bladder habits, change in stools or urine, dysuria,hematuria,  rash, arthralgias, visual complaints, headache, numbness, weakness or ataxia or problems with walking or coordination,  change in mood/affect or memory.                         Objective:   Physical Exam  amb wf nad    08/07/2016          146  06/19/2016        152  05/22/2016        148   02/15/15 155 lb 6.4 oz (70.489 kg)  01/29/15 154 lb (69.854 kg)  11/26/14 153 lb 8 oz (69.627 kg)    Vital signs  reviewed   - Note on arrival 02 sats  100% on RA     HEENT: nl dentition and orophanx with very minimal watery pnd/ cobblestoning . Nl external ear canals without  cough reflex- mild bilateral non-specific turbinate edema     NECK :  without JVD/Nodes/TM/ nl carotid upstrokes bilaterally   LUNGS: no acc muscle use, clear to A and P bilaterally without cough on insp or exp maneuvers   CV:  RRR  no s3 or murmur or increase in P2, no edema   ABD:  soft and nontender with nl excursion in the supine position. No bruits or organomegaly, bowel sounds nl  MS:  warm without deformities, calf tenderness, cyanosis or clubbing  SKIN: warm and dry without lesions    NEURO:  alert, approp, no deficits         Assessment & Plan:

## 2016-08-09 NOTE — Assessment & Plan Note (Addendum)
Onset around 2008 - Allergy profile 05/22/2016 >  Eos 0.3 /  IgE  10 neg rast - singulair / gerd rx started 05/22/2016 >>> still with pnds 06/19/2016 > add dymista / 1st gen H1> no better  - no better 08/07/2016 on singulair/ anoro > rec stop both and just use   h1 prn with one course of prednisone x 6 days to take if h1 ineffective   Note  Upper airway cough syndrome (previously labeled PNDS) , is  so named because it's frequently impossible to sort out how much is  CR/sinusitis with freq throat clearing (which can be related to primary GERD)   vs  causing  secondary (" extra esophageal")  GERD from wide swings in gastric pressure that occur with throat clearing, often  promoting self use of mint and menthol lozenges that reduce the lower esophageal sphincter tone and exacerbate the problem further in a cyclical fashion.   These are the same pts (now being labeled as having "irritable larynx syndrome" by some cough centers) who not infrequently have a history of having failed to tolerate ace inhibitors,  dry powder inhalers or biphosphonates or report having atypical/extraesophageal reflux symptoms that don't respond to standard doses of PPI  and are easily confused as having aecopd or asthma flares by even experienced allergists/ pulmonologists (myself included).   If not better next step is trial of gabapentin  Each maintenance medication was reviewed in detail including most importantly the difference between maintenance and as needed and under what circumstances the prns are to be used.  Please see AVS for specific  Instructions which are unique to this visit and I personally typed out  which were reviewed in detail in writing with the patient and a copy provided.

## 2016-08-13 ENCOUNTER — Other Ambulatory Visit: Payer: Self-pay | Admitting: Internal Medicine

## 2016-08-15 ENCOUNTER — Other Ambulatory Visit: Payer: Self-pay | Admitting: Internal Medicine

## 2016-08-15 ENCOUNTER — Telehealth: Payer: Self-pay | Admitting: Internal Medicine

## 2016-08-15 NOTE — Telephone Encounter (Signed)
Called patient to schedule awv. Lvm for patient to call office to schedule appt.  °

## 2016-08-17 ENCOUNTER — Encounter: Payer: Self-pay | Admitting: Internal Medicine

## 2016-08-17 ENCOUNTER — Ambulatory Visit (INDEPENDENT_AMBULATORY_CARE_PROVIDER_SITE_OTHER): Payer: Medicare Other | Admitting: Internal Medicine

## 2016-08-17 VITALS — BP 116/68 | HR 82 | Temp 98.0°F | Ht 63.0 in | Wt 144.1 lb

## 2016-08-17 DIAGNOSIS — Z7901 Long term (current) use of anticoagulants: Secondary | ICD-10-CM | POA: Diagnosis not present

## 2016-08-17 DIAGNOSIS — R05 Cough: Secondary | ICD-10-CM | POA: Diagnosis not present

## 2016-08-17 DIAGNOSIS — Z0000 Encounter for general adult medical examination without abnormal findings: Secondary | ICD-10-CM | POA: Diagnosis not present

## 2016-08-17 DIAGNOSIS — I10 Essential (primary) hypertension: Secondary | ICD-10-CM

## 2016-08-17 DIAGNOSIS — R058 Other specified cough: Secondary | ICD-10-CM

## 2016-08-17 DIAGNOSIS — R197 Diarrhea, unspecified: Secondary | ICD-10-CM | POA: Diagnosis not present

## 2016-08-17 DIAGNOSIS — G459 Transient cerebral ischemic attack, unspecified: Secondary | ICD-10-CM

## 2016-08-17 DIAGNOSIS — M069 Rheumatoid arthritis, unspecified: Secondary | ICD-10-CM

## 2016-08-17 NOTE — Assessment & Plan Note (Signed)
Dr Charlestine Night is retiring Will ref to Dr Trudie Reed

## 2016-08-17 NOTE — Assessment & Plan Note (Signed)
No relapse Plavix

## 2016-08-17 NOTE — Assessment & Plan Note (Signed)
f/u w/Dr Melvyn Novas

## 2016-08-17 NOTE — Progress Notes (Signed)
Pre visit review using our clinic review tool, if applicable. No additional management support is needed unless otherwise documented below in the visit note. 

## 2016-08-17 NOTE — Assessment & Plan Note (Signed)
recurrent x years: IBS-d Imodium prn

## 2016-08-17 NOTE — Patient Instructions (Signed)
MC well w/Jill 

## 2016-08-17 NOTE — Assessment & Plan Note (Signed)
Plavix 

## 2016-08-17 NOTE — Assessment & Plan Note (Signed)
Losartan, Metoprolol

## 2016-08-17 NOTE — Progress Notes (Signed)
Subjective:  Patient ID: [name], female    DOB: 12/27/1940  Age: 76 y.o. MRN: [id number]  CC: No chief complaint on file.   HPI [name] presents for HTN, CAD, IBS-d f/u. C/o cough - better  Outpatient Medications Prior to Visit  Medication Sig Dispense Refill  . acetaminophen (TYLENOL) 500 MG tablet Take 500 mg by mouth every 6 (six) hours as needed for mild pain.    [name] atorvastatin (LIPITOR) 80 MG tablet Take 1 tablet by mouth  daily 90 tablet 3  . chlorpheniramine (CHLOR-TRIMETON) 4 MG tablet Take 4 mg by mouth 2 (two) times daily.    . clopidogrel (PLAVIX) 75 MG tablet TAKE 1 TABLET BY MOUTH  DAILY 90 tablet 2  . famotidine (PEPCID) 20 MG tablet TAKE 1 TABLET AT BEDTIME 30 tablet 2  . leflunomide (ARAVA) 20 MG tablet Take 1 tablet (20 mg total) by mouth every other day. 15 tablet 6  . levothyroxine (SYNTHROID, LEVOTHROID) 88 MCG tablet TAKE 1 TABLET BY MOUTH  DAILY 90 tablet 2  . loperamide (IMODIUM A-D) 2 MG tablet Take 2 mg by mouth 4 (four) times daily as needed for diarrhea or loose stools.    [name] losartan (COZAAR) 25 MG tablet TAKE 1 TABLET BY MOUTH  DAILY 90 tablet 2  . metoprolol succinate (TOPROL-XL) 25 MG 24 hr tablet TAKE 1 TABLET BY MOUTH  DAILY 90 tablet 2  . pantoprazole (PROTONIX) 40 MG tablet Take 1 tablet (40 mg total) by mouth daily. Take 30-60 min before first meal of the day 30 tablet 2  . pantoprazole (PROTONIX) 40 MG tablet TAKE 1 TABLET (40 MG TOTAL) BY MOUTH DAILY. TAKE 30-60 MIN BEFORE FIRST MEAL OF THE DAY 30 tablet 2  . predniSONE (DELTASONE) 10 MG tablet Take  4 each am x 2 days,   2 each am x 2 days,  1 each am x 2 days and stop 14 tablet 0  . Probiotic Product (ALIGN) 4 MG CAPS Take 1 capsule by mouth daily. 30 capsule 1  . psyllium (METAMUCIL) 58.6 % packet Take 1 packet by mouth daily.    [name] albuterol (PROVENTIL HFA;VENTOLIN HFA) 108 (90 Base) MCG/ACT inhaler Inhale 2 puffs into the lungs every 6 (six) hours as needed for wheezing or shortness  of breath. (Patient not taking: Reported on 08/07/2016) 1 Inhaler 0   No facility-administered medications prior to visit.     ROS Review of Systems  Constitutional: Negative for activity change, appetite change, chills, fatigue and unexpected weight change.  HENT: Negative for congestion, mouth sores and sinus pressure.   Eyes: Negative for visual disturbance.  Respiratory: Negative for cough and chest tightness.   Gastrointestinal: Positive for diarrhea. Negative for abdominal pain and nausea.  Genitourinary: Negative for difficulty urinating, frequency and vaginal pain.  Musculoskeletal: Negative for back pain and gait problem.  Skin: Negative for pallor and rash.  Neurological: Negative for dizziness, tremors, weakness, numbness and headaches.  Psychiatric/Behavioral: Negative for confusion, sleep disturbance and suicidal ideas.    Objective:  BP 116/68 (BP Location: Left Arm, Patient Position: Sitting, Cuff Size: Normal)   Pulse 82   Temp 98 F (36.7 C) (Oral)   Ht 5\' 3"  (1.6 m)   Wt 144 lb 1.9 oz (65.4 kg)   SpO2 98%   BMI 25.53 kg/m   BP Readings from Last 3 Encounters:  08/17/16 116/68  08/07/16 128/72  06/29/16 (!) 184/69    Wt Readings from Last  3 Encounters:  08/17/16 144 lb 1.9 oz (65.4 kg)  08/07/16 146 lb (66.2 kg)  06/28/16 151 lb (68.5 kg)    Physical Exam  Constitutional: She appears well-developed. No distress.  HENT:  Head: Normocephalic.  Right Ear: External ear normal.  Left Ear: External ear normal.  Nose: Nose normal.  Mouth/Throat: Oropharynx is clear and moist.  Eyes: Conjunctivae are normal. Pupils are equal, round, and reactive to light. Right eye exhibits no discharge. Left eye exhibits no discharge.  Neck: Normal range of motion. Neck supple. No JVD present. No tracheal deviation present. No thyromegaly present.  Cardiovascular: Normal rate, regular rhythm and normal heart sounds.   Pulmonary/Chest: No stridor. No respiratory distress.  She has no wheezes.  Abdominal: Soft. Bowel sounds are normal. She exhibits no distension and no mass. There is no tenderness. There is no rebound and no guarding.  Musculoskeletal: She exhibits no edema or tenderness.  Lymphadenopathy:    She has no cervical adenopathy.  Neurological: She displays normal reflexes. No cranial nerve deficit. She exhibits normal muscle tone. Coordination normal.  Skin: No rash noted. No erythema.  Psychiatric: She has a normal mood and affect. Her behavior is normal. Judgment and thought content normal.    Lab Results  Component Value Date   WBC 7.4 06/27/2016   HGB 12.1 06/27/2016   HCT 37.0 06/27/2016   PLT 424 (H) 06/27/2016   GLUCOSE 86 06/27/2016   CHOL 146 04/25/2016   TRIG 93 04/25/2016   HDL 60 04/25/2016   LDLDIRECT 162.1 10/28/2009   LDLCALC 68 04/25/2016   ALT 11 04/25/2016   AST 17 04/25/2016   NA 139 06/27/2016   K 4.5 06/27/2016   CL 103 06/27/2016   CREATININE 1.32 (H) 06/27/2016   BUN 29 (H) 06/27/2016   CO2 27 06/27/2016   TSH 1.65 12/23/2015   INR 0.96 04/15/2016    No results found.  Assessment & Plan:   There are no diagnoses linked to this encounter. I have discontinued Ms. Behler's albuterol. I am also having her maintain her psyllium, leflunomide, ALIGN, loperamide, acetaminophen, atorvastatin, chlorpheniramine, metoprolol succinate, losartan, levothyroxine, clopidogrel, pantoprazole, predniSONE, famotidine, and pantoprazole.  No orders of the defined types were placed in this encounter.    Follow-up: No Follow-up on file.  [name], MD

## 2016-09-05 ENCOUNTER — Encounter (HOSPITAL_COMMUNITY): Payer: Self-pay

## 2016-09-26 ENCOUNTER — Ambulatory Visit (INDEPENDENT_AMBULATORY_CARE_PROVIDER_SITE_OTHER): Payer: Medicare Other | Admitting: Internal Medicine

## 2016-09-26 ENCOUNTER — Encounter: Payer: Self-pay | Admitting: Internal Medicine

## 2016-09-26 VITALS — BP 112/70 | HR 84 | Ht 63.0 in | Wt 141.4 lb

## 2016-09-26 DIAGNOSIS — R05 Cough: Secondary | ICD-10-CM

## 2016-09-26 DIAGNOSIS — R058 Other specified cough: Secondary | ICD-10-CM

## 2016-09-26 DIAGNOSIS — R911 Solitary pulmonary nodule: Secondary | ICD-10-CM | POA: Diagnosis not present

## 2016-09-26 NOTE — Patient Instructions (Signed)
Continue protonix and pepcid for another 2 months then fine to try to taper  off by stopping the protonix first and changing the pepcid 20 mg twice daily then taper the pepcid off as well and see if cough flares or not - if does flare, restart and you may need GI evaluation   For drainage / throat tickle try take CHLORPHENIRAMINE  4 mg - take one every 4 hours as needed - available over the counter- may cause drowsiness so start with just a bedtime dose or two and see how you tolerate it before trying in daytime    If not satisfied we start you on gabapentin 100 mg three times daily    If you are satisfied with your treatment plan,  let your doctor know and he/she can either refill your medications or you can return here when your prescription runs out.     If in any way you are not 100% satisfied,  please tell us.  If 100% better, tell your friends!  Pulmonary follow up is as needed

## 2016-09-26 NOTE — Progress Notes (Signed)
Subjective:   Patient ID: [name], female    DOB: 1940-09-12     MRN: [id number]     Brief patient profile:  76   yowf  Only smoked age 76 - 64 s obvious sequelae with new cough while on a cruise in September 2016 in [place] > cxr > ct chest > PET > referred to pulmonary clinic 02/15/2015 by Dr  Philis Fendt [facility] with GOLD I copd 02/19/15     History of Present Illness  02/15/2015 1st [facility] office visit/ [name]   Chief Complaint  Patient presents with  . PULMONARY CONSULT    Pulmonary nodules. Pt reports some dyspnea with exertion, some morning dry to wet cough. Pt does not use any inhalers.   Cough better but some cc am congestion x years ? Worse in winter > total ? Usually swallows so can't say color or amt assoc with mild pnds  Doe only with exertion = MMRC1 = can walk nl pace, flat grade, can't hurry or go uphills or steps s mild sob  rec Please remember to go to the lab  department downstairs for your tests - we will call you with the results when they are available. 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 Acute respiratory failure with hypoxia ([place]) 04/15/2016  . Acute respiratory failure with hypoxemia ([place]) 04/16/2016  . Essential hypertension 12/08/2006  . Chronic kidney disease 12/08/2006  . 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) 12/22/1994    Class: History of  . 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    Discharge Condition: stable  Diet recommendation: heart healthy      Filed Weights   04/15/16 1153 04/15/16 1900  Weight: 68 kg (150 lb) 67.9 kg (149 lb 12.8 oz)    History of present illness:  Chief Complaint: Coughing  HPI: Sherry Searles Hermanis a 76 y.o.femalewith medical history significant of COPD, CAD s/p CABG x1, PVD, CVA, AAA s/p repair. Symptoms started about a month ago with a nonproductive cough. Symptoms remain persistent and she went to primary care physician about 2 weeks ago. She is prescribed antibiotics at that time which helped for about 1 day, but symptoms continue to persist. She eventually came to the emergency department because her symptoms continuedto worsen. She reports no aggravating symptoms. He has mild dyspnea at rest.  ED Course: Vitals: Afebrile. Slight tachycardia to 100s. Slightly hypertensive. On 2L O2 via [place] Labs: creatinine 1.31.  Imaging: CT shows abnormal enlarging soft tissue in left upper lobe Medications/Course: Given albuterol, atrovent and solu-medrol.  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; now atretic and significantly improved LAD lesion without intervention   Acute respiratory failure with hypoxemia (HCC)   Acute respiratory failure with hypoxia with cough, failed outpatient treatment Patient report she started have cough 4weeks ago, she was seen by primary care doctor on 12/4 and on 12/16, she was treated with oral abx cedinir, however her symptom did not improve, she is sent by pmd to the hospital due to persistent symptom and she started to have tachycardia  and reduced oxygen level She presented to the hospital, initially her oxygen saturation was in the 80's, she is tachycardia in the 100's, tachypnea , her respiration rate stayed around 28 in the ED, at one point her respiration rate in the Ed got up to 45. She has mild lactic acidosis on presentation as well Initial CTA no pneumonia, no PE,  Viral panel + metapneumovirus. - supportive care and wean oxygen as able, Albuterol prn -she report did not feel any better on 12/18, she continued to cough, feeling weak.Due to significant underline immunosuppressed status, no clinical improvement and now has mild leukocytosis on 12/18 , doxycycline empirically started to prevent/treat bacterial superinfection. repeat cxr no lung infiltrate. 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 CT on admission showed: "Enlarging abnormal soft tissue in the left upper lobe at the prior surgical site concerning for recurrent tumor. This measures up to 2.2 cm. This could be further evaluated with PET CT. Stable left lower lobe pulmonary nodule. Borderline and mildly enlarged mediastinal lymph nodes. - thoracic surgery Dr. Servando Snare consulted who recommended have PET scan and outpatient follow up with pulmonology and thoracic surgery   Hypertension -continue metoprolol -continue losartan  CAD s/p CABG -continue lipitor/plavix ( report allergic to asa)   CVA (TIA in 2016) -continue statin/Plavix  CKD stage  III Stable  Adrenal mass -stable  Hypothyroidism -continue levothyroxine    05/22/2016 extended post op  f/u ov/[name] re: acute resp failure ? Viral related  Chief Complaint  Patient presents with  . Hospitalization Follow-up    Breathing has improved but not back to her normal baseline. She is coughing less.    throat clearing x 10 years worse in daytime > never cough up excess/ purulent sputum or mucus plugs  Or blood Worse in late  November 2017 > with noct cough  referred to Dr Servando Snare for new  Cough worse off nasal spray  = flonase  Started pepcid ? otc hs some better  Cough resolves on prednisone every time  Doe = MMRC2 = can't walk a nl pace on a flat grade s sob but does fine slow and flat eg shopping at [employer], nothing more rec Prednisone 10 mg take  4 each am x 2 days,  2 each am x 2 days,  1 each am x 2 days and stop  Singulair 10 mg (montelukast)  at bedtime  Pantoprazole (protonix) 40 mg   Take  30-60 min before first meal of  the day and Pepcid (famotidine)  20 mg one @  bedtime until return to office - this is the best way to tell whether stomach acid is contributing to your problem.   GERD diet     06/19/2016  f/u ov/[name] re: uacs/ pnds on singulair/ gerd rx/ flonase  Chief Complaint  Patient presents with  . Follow-up    Her breathing has improved to her normal baseline. She states having increased PND. Cough has "pretty much gone away".  She has not had to use albuterol inhaler.    no noct symptoms, during the day only of some sense of pnds gone only while on prednisone, back since stopped it  rec Stop flonase and try dymista one twice daily  For drainage / throat tickle instead of zyrtec ok to  try   CHLORPHENIRAMINE  4 mg - take one every 4 hours as needed - available over the counter- may cause drowsiness so start with just a bedtime dose or two and see how you tolerate it before trying in daytime      08/07/2016  f/u ov/[name] re:  uacs on anoro/ singulair and  no better, no better with dymista, did not try h1 Chief Complaint  Patient presents with  . Follow-up    Coughing more and having more PND. Cough is non prod. She is not using her albuterol inhaler.   after stirs has  lots of nonproductive coughing assoc with sense of pnds  no better on anoro,  Not limited by breathing from desired activities   rec Stop anoro and montelukast to see what difference this makes For drainage / throat tickle try take CHLORPHENIRAMINE  4 mg - take one every 4 hours as needed - available over the counter-   - late add:  pred x 6 days prn flare x one - consider adding gabapentin next ov    09/26/2016  f/u ov/[name] re: GOLD I copd [name] / no worse breathing or coughing off anoro/montelukast but maint on nasacort  Chief Complaint  Patient presents with  . Follow-up    Breathing is doing well and she is not coughing much. No new co's.  doe = MMRC1 = can walk nl pace, flat grade, can't hurry or go uphills or steps s sob   Only a little drainage p one chlorpheniramine in am and double strength at hs and sleep fine now  No obvious day to day or daytime variability or assoc excess/ purulent sputum or mucus plugs or hemoptysis or cp or chest tightness, subjective wheeze or overt sinus or hb symptoms. No unusual exp hx or h/o childhood pna/ asthma or knowledge of premature birth.  Sleeping ok without nocturnal  or early am exacerbation  of respiratory  c/o's or need for noct saba. Also denies any obvious fluctuation of symptoms with weather or environmental changes or other aggravating or alleviating factors except as outlined above   Current Medications, Allergies, Complete Past Medical History, Past Surgical History, Family History, and Social History were reviewed in [employer] record.  ROS  The following are not active complaints unless bolded sore throat, dysphagia, dental problems, itching, sneezing,  nasal congestion or excess/ purulent  secretions, ear ache,   fever, chills, sweats, unintended wt loss, classically pleuritic or exertional cp,  orthopnea pnd or leg swelling, presyncope, palpitations, abdominal pain, anorexia, nausea, vomiting, diarrhea  or change in bowel or bladder habits, change in stools or urine, dysuria,hematuria,  rash, arthralgias, visual complaints, headache,  numbness, weakness or ataxia or problems with walking or coordination,  change in mood/affect or memory.                      Objective:   Physical Exam  amb wf nad   09/26/2016        141  08/07/2016          146  06/19/2016        152  05/22/2016        148   02/15/15 155 lb 6.4 oz (70.489 kg)  01/29/15 154 lb (69.854 kg)  11/26/14 153 lb 8 oz (69.627 kg)    Vital signs reviewed   - Note on arrival 02 sats  100% on RA     HEENT: nl dentition and orophanx  Clear  Nl external ear canals without cough reflex- mild bilateral non-specific turbinate edema     NECK :  without JVD/Nodes/TM/ nl carotid upstrokes bilaterally   LUNGS: no acc muscle use, clear to A and P bilaterally without cough on insp or exp maneuvers   CV:  RRR  no s3 or murmur or increase in P2, no edema   ABD:  soft and nontender with nl excursion in the supine position. No bruits or organomegaly, bowel sounds nl  MS:  warm without deformities, calf tenderness, cyanosis or clubbing  SKIN: warm and dry without lesions    NEURO:  alert, approp, no deficits         Assessment & Plan:   Outpatient Encounter Prescriptions as of 09/26/2016  Medication Sig  . acetaminophen (TYLENOL) 500 MG tablet Take 500 mg by mouth every 6 (six) hours as needed for mild pain.  [name] atorvastatin (LIPITOR) 80 MG tablet Take 1 tablet by mouth  daily  . chlorpheniramine (CHLOR-TRIMETON) 4 MG tablet Take 4 mg by mouth 2 (two) times daily.  . clopidogrel (PLAVIX) 75 MG tablet TAKE 1 TABLET BY MOUTH  DAILY  . famotidine (PEPCID) 20 MG tablet TAKE 1 TABLET AT BEDTIME  . leflunomide (ARAVA)  20 MG tablet Take 1 tablet (20 mg total) by mouth every other day.  . levothyroxine (SYNTHROID, LEVOTHROID) 88 MCG tablet TAKE 1 TABLET BY MOUTH  DAILY  . loperamide (IMODIUM A-D) 2 MG tablet Take 2 mg by mouth 4 (four) times daily as needed for diarrhea or loose stools.  [name] losartan (COZAAR) 25 MG tablet TAKE 1 TABLET BY MOUTH  DAILY  . metoprolol succinate (TOPROL-XL) 25 MG 24 hr tablet TAKE 1 TABLET BY MOUTH  DAILY  . pantoprazole (PROTONIX) 40 MG tablet Take 1 tablet (40 mg total) by mouth daily. Take 30-60 min before first meal of the day  . Probiotic Product (ALIGN) 4 MG CAPS Take 1 capsule by mouth daily.  . psyllium (METAMUCIL) 58.6 % packet Take 1 packet by mouth daily.  . predniSONE (DELTASONE) 10 MG tablet Take  4 each am x 2 days,   2 each am x 2 days,  1 each am x 2 days and stop (Patient not taking: Reported on 09/26/2016)  . [DISCONTINUED] pantoprazole (PROTONIX) 40 MG tablet TAKE 1 TABLET (40 MG TOTAL) BY MOUTH DAILY. TAKE 30-60 MIN BEFORE FIRST MEAL OF THE DAY   No facility-administered encounter medications on file as of 09/26/2016.

## 2016-09-27 NOTE — Assessment & Plan Note (Signed)
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 This measures up to 2.2 cm.  Stable left lower lobe pulmonary nodule. Borderline and mildly enlarged mediastinal lymph nodes > f/u Dr Servando Snare planned

## 2016-09-27 NOTE — Assessment & Plan Note (Signed)
Onset around 2008 - Allergy profile 05/22/2016 >  Eos 0.3 /  IgE  10 neg rast - singulair / gerd rx started 05/22/2016 >>> still with pnds 06/19/2016 > add dymista / 1st gen H1> no better but never took h1 so rec stop dymista  - no better 08/07/2016 on singulair/ anoro > rec stop both and just use prn saba/ h1 > improved 09/26/2016   After 10 years she's finally better on treatment directed at the upper airway namely maximum reflux treatment and elimination of postnasal drainage with first generation H1 blockers. In this setting would make sense to continue the same treatment for 3 months then try to taper off of the ppi if possible based on reported side effects from long-term PPIs. Should she flare during that taper I would refer her to GI.  Pulmonary follow-up can be as needed.

## 2016-10-02 NOTE — Addendum Note (Signed)
Addendum  created 10/02/16 1141 by Myrtie Soman, MD   Sign clinical note

## 2016-11-11 ENCOUNTER — Other Ambulatory Visit: Payer: Self-pay | Admitting: Internal Medicine

## 2016-12-20 NOTE — Progress Notes (Signed)
Subjective:   [name] is a 76 y.o. female who presents for Medicare Annual (Subsequent) preventive examination.  Review of Systems:  No ROS.  Medicare Wellness Visit. Additional risk factors are reflected in the social history.  Cardiac Risk Factors include: advanced age (>58men, >73 women);hypertension Sleep patterns: feels rested on waking, gets up 2 times nightly to void and sleeps 6-7 hours nightly.    Home Safety/Smoke Alarms: Feels safe in home. Smoke alarms in place.  Living environment; residence and Firearm Safety: apartment, no firearms, firearms stored safely. Lives with husband, no needs for DME, good support system Seat Belt Safety/Bike Helmet: Wears seat belt.     Objective:     Vitals: BP 116/62   Pulse 78   Resp 20   Ht 5\' 3"  (1.6 m)   Wt 144 lb (65.3 kg)   SpO2 93%   BMI 25.51 kg/m   Body mass index is 25.51 kg/m.   Tobacco History  Smoking Status  . Former Smoker  . Quit date: 12/04/1958  Smokeless Tobacco  . Never Used     Counseling given: Not Answered   Past Medical History:  Diagnosis Date  . Abdominal aortic aneurysm (HCC)    REPAIRED IN 1996 BY [name]  AND HAS RECENTLY BEEN FOLLOWED BY [name]  . 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 Adenomatous colon polyp    tubular  . Anemia   . CAD in native artery 1996, 2002, 2005    Status post CABG x1 with LIMA-LAD for ostial LAD 90% stenosis --> down to 50% in 2002 and 30% in 2005.;  Atretic LIMA; Myoview 06/2010: Fixed anteroseptal, apical and inferoapical defect with moderate size. Most likely scar. Mild subendocardial ischemia. EF 71% LOW RISK.   [name] Cervical disc disease   . Chronic kidney disease   . COPD (chronic obstructive pulmonary disease) ([place])   . Diverticulosis   . Hyperlipidemia   . Hypertension   . Hypothyroidism (acquired)    hypo  . Myocardial infarction [facility]) Aug. 2016   TIA  .  Polymyalgia rheumatica (HCC)    2011 Dr. Charlestine Night  . Rheumatoid arthritis ([place]) 2011   Dr.Truslow; fracture knees, hands and wrists -   . S/P CABG x 1 1996   CABG--LIMA-LAD for ostial LAD (not felt to be PCI amenable). EF NORMAL then; LIMA now atretic  . Shortness of breath dyspnea    with exertion  . Stroke [facility]) 11-2014   TIA   . Urinary frequency    Past Surgical History:  Procedure Laterality Date  . 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 APPENDECTOMY    . BUNIONECTOMY  07/2011   right foot  . CARDIAC CATHETERIZATION  2005   (Most recent CATH) - ostial LAD lesion 20-30% (down from 90% initially). Atretic LIMA. Minimal disease the RCA and Circumflex system.  [name] CARPAL TUNNEL RELEASE Left   . CATARACT EXTRACTION Bilateral   . CERVICAL SPINE SURGERY     plate 2008 Dr. Saintclair Halsted  . [place]   INCLUDED AN INTERNAL MAMMARY ARTERY TO THE LAD. EF WAS NORMAL  . INGUINAL HERNIA REPAIR Right   . LAPAROSCOPIC APPENDECTOMY N/A 06/28/2016   Procedure: APPENDECTOMY LAPAROSCOPIC;  Surgeon: Leighton Ruff, MD;  Location: [facility];  Service: General;  Laterality:  N/A;  . NM MYOVIEW LTD  March 2012   Fixed anteroseptal, apical and inferoapical defect with moderate size. Most likely scar. Mild subendocardial ischemia. EF 71% LOW RISK.   [name] SPLENECTOMY    . TRANSTHORACIC ECHOCARDIOGRAM  12/2014   [facility]: Normal LV size & function. EF 55-60%,   . vagina polyp    . VIDEO ASSISTED THORACOSCOPY (VATS)/WEDGE RESECTION Left 03/02/2015   Procedure: VIDEO ASSISTED THORACOSCOPY (VATS), MINI THORACOTOMY, LEFT UPPER LOBE WEDGE, TAKE DOWN OF INTERNAL MAMMARY LESIONS, PLACEMENT OF ON-Q PUMP;  Surgeon: Grace Isaac, MD;  Location: [place];  Service: Thoracic;  Laterality: Left;  [name] VIDEO BRONCHOSCOPY N/A 03/02/2015   Procedure: BRONCHOSCOPY;  Surgeon: Grace Isaac, MD;   Location: [facility] OR;  Service: Thoracic;  Laterality: N/A;   Family History  Problem Relation Age of Onset  . Hypertension Mother   . Diabetes Mother   . Heart disease Mother   . Hyperlipidemia Mother   . Stroke Father   . Hyperlipidemia Sister   . Hypertension Sister   . Hypertension Daughter   . Cancer Paternal Uncle        Deceased from cancer not sure of site  . Hypertension Son   . Hyperlipidemia Son   . Hyperlipidemia Son   . Hypertension Son   . Hyperlipidemia Son   . Hypertension Son   . Hypertension Unknown   . Coronary artery disease Unknown   . Asthma Neg Hx   . Colon cancer Neg Hx    History  Sexual Activity  . Sexual activity: Not Currently    Outpatient Encounter Prescriptions as of 12/21/2016  Medication Sig  . acetaminophen (TYLENOL) 500 MG tablet Take 500 mg by mouth every 6 (six) hours as needed.  [name] atorvastatin (LIPITOR) 80 MG tablet Take 80 mg by mouth daily.  . clopidogrel (PLAVIX) 75 MG tablet Take 75 mg by mouth daily.  . famotidine (PEPCID) 10 MG tablet Take 10 mg by mouth at bedtime.  [name] leflunomide (ARAVA) 20 MG tablet Take 20 mg by mouth daily.  [name] levothyroxine (SYNTHROID, LEVOTHROID) 88 MCG tablet Take 88 mcg by mouth daily before breakfast.  . loperamide (IMODIUM A-D) 2 MG tablet Take 2 mg by mouth 4 (four) times daily as needed for diarrhea or loose stools.  [name] losartan (COZAAR) 25 MG tablet TAKE 1 TABLET BY MOUTH  DAILY  . metoprolol succinate (TOPROL-XL) 25 MG 24 hr tablet Take 25 mg by mouth daily.  . predniSONE (DELTASONE) 10 MG tablet Take  4 each am x 2 days,   2 each am x 2 days,  1 each am x 2 days and stop  . Probiotic Product (ALIGN) 4 MG CAPS Take 1 capsule by mouth daily.  . psyllium (METAMUCIL) 58.6 % packet Take 1 packet by mouth daily.  . [DISCONTINUED] acetaminophen (TYLENOL) 500 MG tablet Take 500 mg by mouth every 6 (six) hours as needed for mild pain.  . [DISCONTINUED] atorvastatin (LIPITOR) 80 MG tablet Take 1 tablet by mouth   daily  . [DISCONTINUED] chlorpheniramine (CHLOR-TRIMETON) 4 MG tablet Take 4 mg by mouth 2 (two) times daily.  . [DISCONTINUED] clopidogrel (PLAVIX) 75 MG tablet TAKE 1 TABLET BY MOUTH  DAILY  . [DISCONTINUED] famotidine (PEPCID) 20 MG tablet TAKE 1 TABLET AT BEDTIME  . [DISCONTINUED] leflunomide (ARAVA) 20 MG tablet Take 1 tablet (20 mg total) by mouth every other day.  . [DISCONTINUED] levothyroxine (SYNTHROID, LEVOTHROID) 88 MCG tablet TAKE 1 TABLET BY  MOUTH  DAILY  . [DISCONTINUED] loperamide (IMODIUM A-D) 2 MG tablet Take 2 mg by mouth 4 (four) times daily as needed for diarrhea or loose stools.  . [DISCONTINUED] metoprolol succinate (TOPROL-XL) 25 MG 24 hr tablet TAKE 1 TABLET BY MOUTH  DAILY  . Zoster Vac Recomb Adjuvanted [facility]) injection Inject 0.5 mLs into the muscle once.  . [DISCONTINUED] pantoprazole (PROTONIX) 40 MG tablet Take 1 tablet (40 mg total) by mouth daily. Take 30-60 min before first meal of the day (Patient not taking: Reported on 12/21/2016)   No facility-administered encounter medications on file as of 12/21/2016.     Activities of Daily Living In your present state of health, do you have any difficulty performing the following activities: 12/21/2016 06/28/2016  Hearing? N N  Vision? N N  Difficulty concentrating or making decisions? N N  Walking or climbing stairs? N N  Dressing or bathing? N N  Doing errands, shopping? N N  Preparing Food and eating ? N -  Using the Toilet? N -  In the past six months, have you accidently leaked urine? N -  Do you have problems with loss of bowel control? N -  Managing your Medications? N -  Managing your Finances? N -  Housekeeping or managing your Housekeeping? N -  Some recent data might be hidden    Patient Care Team: Plotnikov, [name], MD as PCP - General Hurley Cisco, MD as Attending Physician (Internal Medicine) Leonie Man, MD as Consulting Physician (Cardiology) Hurley Cisco, MD as Consulting  Physician (Rheumatology) Gavin Pound, MD as Consulting Physician (Rheumatology) Armbruster, Renelda Loma, MD as Consulting Physician (Gastroenterology) Tanda Rockers, MD as Consulting Physician (Pulmonary Disease)    Assessment:    Physical assessment deferred to PCP.  Exercise Activities and Dietary recommendations Current Exercise Habits: Home exercise routine, Type of exercise: walking, Time (Minutes): 35, Frequency (Times/Week): 3, Weekly Exercise (Minutes/Week): 105, Intensity: Mild, Exercise limited by: orthopedic condition(s)  Diet (meal preparation, eat out, water intake, caffeinated beverages, dairy products, fruits and vegetables): in general, a "healthy" diet  , well balanced, eats a variety of fruits and vegetables daily, limits salt, fat/cholesterol, sugar, caffeine, drinks 6-8 glasses of water daily.    Goals      Patient Stated   . exercise (pt-stated)          Would like to exercise more than she does; Moderate pool exercise 3 times a week Would like to start walking/ Coached on ways to stay motivated to walk; Takes stairs 3 flights down and down to basement; Will work on going up      Other   . maintian current health status          Continue eat healthy, exercise, enjoy life and family      Fall Risk Fall Risk  12/21/2016 08/17/2016 11/26/2014 12/09/2013 01/25/2013  Falls in the past year? No No No No No  Risk for fall due to : - - - Impaired vision -  Risk for fall due to: Comment - - - macular degeneration -   Depression Screen PHQ 2/9 Scores 12/21/2016 08/17/2016 11/26/2014 12/09/2013  PHQ - 2 Score 0 0 0 0  PHQ- 9 Score 2 - - -     Cognitive Function MMSE - Mini Mental State Exam 12/21/2016 11/26/2014  Not completed: - Unable to complete  Orientation to time 5 -  Orientation to Place 5 -  Registration 3 -  Attention/ Calculation 5 -  Recall 2 -  Language- name 2 objects 2 -  Language- repeat 1 -  Language- follow 3 step command 3 -  Language-  read & follow direction 1 -  Write a sentence 1 -  Copy design 1 -  Total score 29 -        Immunization History  Administered Date(s) Administered  . Influenza Whole 05/02/2001, 01/27/2008, 01/12/2010, 12/31/2010, 01/25/2012  . Influenza, High Dose Seasonal PF 01/14/2014, 12/21/2016  . Influenza,inj,Quad PF,6+ Mos 12/23/2015  . Influenza-Unspecified 02/03/2013, 01/13/2014, 02/13/2015  . Meningococcal Polysaccharide 03/21/2012  . PPD Test 11/18/2013  . Pneumococcal Conjugate-13 03/20/2013  . Pneumococcal Polysaccharide-23 05/02/2000, 06/23/2010  . Td 05/01/2006  . Tdap 06/01/2016  . Zoster 01/17/2006   Screening Tests Health Maintenance  Topic Date Due  . COLONOSCOPY  03/30/2019  . TETANUS/TDAP  06/01/2026  . INFLUENZA VACCINE  Completed  . DEXA SCAN  Completed  . PNA vac Low Risk Adult  Completed      Plan:    Continue doing brain stimulating activities (puzzles, reading, adult coloring books, staying active) to keep memory sharp.   Continue to eat heart healthy diet (full of fruits, vegetables, whole grains, lean protein, water--limit salt, fat, and sugar intake) and increase physical activity as tolerated.  I have personally reviewed and noted the following in the patient's chart:   . Medical and social history . Use of alcohol, tobacco or illicit drugs  . Current medications and supplements . Functional ability and status . Nutritional status . Physical activity . Advanced directives . List of other physicians . Vitals . Screenings to include cognitive, depression, and falls . Referrals and appointments  In addition, I have reviewed and discussed with patient certain preventive protocols, quality metrics, and best practice recommendations. A written personalized care plan for preventive services as well as general preventive health recommendations were provided to patient.     Michiel Cowboy, RN  12/21/2016

## 2016-12-20 NOTE — Progress Notes (Signed)
Pre visit review using our clinic review tool, if applicable. No additional management support is needed unless otherwise documented below in the visit note. 

## 2016-12-21 ENCOUNTER — Ambulatory Visit (INDEPENDENT_AMBULATORY_CARE_PROVIDER_SITE_OTHER): Payer: Medicare Other | Admitting: *Deleted

## 2016-12-21 VITALS — BP 116/62 | HR 78 | Resp 20 | Ht 63.0 in | Wt 144.0 lb

## 2016-12-21 DIAGNOSIS — Z23 Encounter for immunization: Secondary | ICD-10-CM

## 2016-12-21 DIAGNOSIS — Z0000 Encounter for general adult medical examination without abnormal findings: Secondary | ICD-10-CM | POA: Diagnosis not present

## 2016-12-21 MED ORDER — ZOSTER VAC RECOMB ADJUVANTED 50 MCG/0.5ML IM SUSR: 0.5000 mL | Freq: Once | INTRAMUSCULAR | 1 refills | Status: AC

## 2016-12-21 NOTE — Patient Instructions (Addendum)
Continue doing brain stimulating activities (puzzles, reading, adult coloring books, staying active) to keep memory sharp.   Continue to eat heart healthy diet (full of fruits, vegetables, whole grains, lean protein, water--limit salt, fat, and sugar intake) and increase physical activity as tolerated.   [name] , Thank you for taking time to come for your Medicare Wellness Visit. I appreciate your ongoing commitment to your health goals. Please review the following plan we discussed and let me know if I can assist you in the future.   These are the goals we discussed: Goals      Patient Stated   . exercise (pt-stated)          Would like to exercise more than she does; Moderate pool exercise 3 times a week Would like to start walking/ Coached on ways to stay motivated to walk; Takes stairs 3 flights down and down to basement; Will work on going up      Other   . maintian current health status          Continue eat healthy, exercise, enjoy life and family       This is a list of the screening recommended for you and due dates:  Health Maintenance  Topic Date Due  . Flu Shot  11/29/2016  . Colon Cancer Screening  03/30/2019  . Tetanus Vaccine  06/01/2026  . DEXA scan (bone density measurement)  Completed  . Pneumonia vaccines  Completed    Influenza Virus Vaccine (Flucelvax) What is this medicine? INFLUENZA VIRUS VACCINE (in floo EN zuh VAHY ruhs vak SEEN) helps to reduce the risk of getting influenza also known as the flu. The vaccine only helps protect you against some strains of the flu. This medicine may be used for other purposes; ask your health care provider or pharmacist if you have questions. COMMON BRAND NAME(S): FLUCELVAX What should I tell my health care provider before I take this medicine? They need to know if you have any of these conditions: -bleeding disorder like hemophilia -fever or infection -Guillain-Barre syndrome or other neurological  problems -immune system problems -infection with the human immunodeficiency virus (HIV) or AIDS -low blood platelet counts -multiple sclerosis -an unusual or allergic reaction to influenza virus vaccine, other medicines, foods, dyes or preservatives -pregnant or trying to get pregnant -breast-feeding How should I use this medicine? This vaccine is for injection into a muscle. It is given by a health care professional. A copy of Vaccine Information Statements will be given before each vaccination. Read this sheet carefully each time. The sheet may change frequently. Talk to your pediatrician regarding the use of this medicine in children. Special care may be needed. Overdosage: If you think you've taken too much of this medicine contact a poison control center or emergency room at once. Overdosage: If you think you have taken too much of this medicine contact a poison control center or emergency room at once. NOTE: This medicine is only for you. Do not share this medicine with others. What if I miss a dose? This does not apply. What may interact with this medicine? -chemotherapy or radiation therapy -medicines that lower your immune system like etanercept, anakinra, infliximab, and adalimumab -medicines that treat or prevent blood clots like warfarin -phenytoin -steroid medicines like prednisone or cortisone -theophylline -vaccines This list may not describe all possible interactions. Give your health care provider a list of all the medicines, herbs, non-prescription drugs, or dietary supplements you use. Also tell them if you  smoke, drink alcohol, or use illegal drugs. Some items may interact with your medicine. What should I watch for while using this medicine? Report any side effects that do not go away within 3 days to your doctor or health care professional. Call your health care provider if any unusual symptoms occur within 6 weeks of receiving this vaccine. You may still catch the  flu, but the illness is not usually as bad. You cannot get the flu from the vaccine. The vaccine will not protect against colds or other illnesses that may cause fever. The vaccine is needed every year. What side effects may I notice from receiving this medicine? Side effects that you should report to your doctor or health care professional as soon as possible: -allergic reactions like skin rash, itching or hives, swelling of the face, lips, or tongue Side effects that usually do not require medical attention (Report these to your doctor or health care professional if they continue or are bothersome.): -fever -headache -muscle aches and pains -pain, tenderness, redness, or swelling at the injection site -tiredness This list may not describe all possible side effects. Call your doctor for medical advice about side effects. You may report side effects to FDA at 1-800-FDA-1088. Where should I keep my medicine? The vaccine will be given by a health care professional in a clinic, pharmacy, doctor's office, or other health care setting. You will not be given vaccine doses to store at home. NOTE: This sheet is a summary. It may not cover all possible information. If you have questions about this medicine, talk to your doctor, pharmacist, or health care provider.  2018 Elsevier/Gold Standard (2011-03-29 14:06:47)

## 2017-01-06 ENCOUNTER — Other Ambulatory Visit: Payer: Self-pay | Admitting: Internal Medicine

## 2017-02-22 ENCOUNTER — Ambulatory Visit (INDEPENDENT_AMBULATORY_CARE_PROVIDER_SITE_OTHER): Payer: Medicare Other | Admitting: Internal Medicine

## 2017-02-22 ENCOUNTER — Encounter: Payer: Self-pay | Admitting: Internal Medicine

## 2017-02-22 DIAGNOSIS — G459 Transient cerebral ischemic attack, unspecified: Secondary | ICD-10-CM

## 2017-02-22 DIAGNOSIS — I10 Essential (primary) hypertension: Secondary | ICD-10-CM | POA: Diagnosis not present

## 2017-02-22 DIAGNOSIS — Z951 Presence of aortocoronary bypass graft: Secondary | ICD-10-CM | POA: Diagnosis not present

## 2017-02-22 DIAGNOSIS — D510 Vitamin B12 deficiency anemia due to intrinsic factor deficiency: Secondary | ICD-10-CM

## 2017-02-22 MED ORDER — METOPROLOL SUCCINATE ER 25 MG PO TB24: 25.0000 mg | ORAL_TABLET | Freq: Every day | ORAL | 3 refills | Status: DC

## 2017-02-22 MED ORDER — LOSARTAN POTASSIUM 25 MG PO TABS: 25.0000 mg | ORAL_TABLET | Freq: Every day | ORAL | 3 refills | Status: DC

## 2017-02-22 MED ORDER — ATORVASTATIN CALCIUM 80 MG PO TABS: 80.0000 mg | ORAL_TABLET | Freq: Every day | ORAL | 3 refills | Status: DC

## 2017-02-22 MED ORDER — CLOPIDOGREL BISULFATE 75 MG PO TABS: 75.0000 mg | ORAL_TABLET | Freq: Every day | ORAL | 3 refills | Status: DC

## 2017-02-22 MED ORDER — LEVOTHYROXINE SODIUM 88 MCG PO TABS: 88.0000 ug | ORAL_TABLET | Freq: Every day | ORAL | 3 refills | Status: DC

## 2017-02-22 NOTE — Assessment & Plan Note (Signed)
On B12 

## 2017-02-22 NOTE — Assessment & Plan Note (Signed)
Losartan, Metoprolol

## 2017-02-22 NOTE — Progress Notes (Signed)
Subjective:  Patient ID: [name], female    DOB: 06-18-1940  Age: 76 y.o. MRN: [id number]  CC: No chief complaint on file.   HPI TAELOR WAYMIRE presents for CAD, RA, hypothyroidism f/u. L knee hurts at times  Outpatient Medications Prior to Visit  Medication Sig Dispense Refill  . acetaminophen (TYLENOL) 500 MG tablet Take 500 mg by mouth every 6 (six) hours as needed.    [name] atorvastatin (LIPITOR) 80 MG tablet TAKE 1 TABLET BY MOUTH  DAILY 90 tablet 0  . clopidogrel (PLAVIX) 75 MG tablet Take 75 mg by mouth daily.    [name] leflunomide (ARAVA) 20 MG tablet Take 20 mg by mouth daily.    [name] levothyroxine (SYNTHROID, LEVOTHROID) 88 MCG tablet Take 88 mcg by mouth daily before breakfast.    . loperamide (IMODIUM A-D) 2 MG tablet Take 2 mg by mouth 4 (four) times daily as needed for diarrhea or loose stools.    [name] losartan (COZAAR) 25 MG tablet TAKE 1 TABLET BY MOUTH  DAILY 90 tablet 2  . metoprolol succinate (TOPROL-XL) 25 MG 24 hr tablet Take 25 mg by mouth daily.    . psyllium (METAMUCIL) 58.6 % packet Take 1 packet by mouth daily.    . famotidine (PEPCID) 10 MG tablet Take 10 mg by mouth at bedtime.    . predniSONE (DELTASONE) 10 MG tablet Take  4 each am x 2 days,   2 each am x 2 days,  1 each am x 2 days and stop 14 tablet 0  . Probiotic Product (ALIGN) 4 MG CAPS Take 1 capsule by mouth daily. 30 capsule 1   No facility-administered medications prior to visit.     ROS Review of Systems  Constitutional: Negative for activity change, appetite change, chills, fatigue and unexpected weight change.  HENT: Negative for congestion, mouth sores and sinus pressure.   Eyes: Negative for visual disturbance.  Respiratory: Positive for shortness of breath. Negative for cough and chest tightness.   Gastrointestinal: Negative for abdominal pain and nausea.  Genitourinary: Negative for difficulty urinating, frequency and vaginal pain.  Musculoskeletal: Negative for back pain and gait problem.    Skin: Negative for pallor and rash.  Neurological: Negative for dizziness, tremors, weakness, numbness and headaches.  Psychiatric/Behavioral: Negative for confusion and sleep disturbance. The patient is not nervous/anxious.     Objective:  BP 122/80 (BP Location: Left Arm, Patient Position: Sitting, Cuff Size: Normal)   Pulse 86   Temp 97.8 F (36.6 C) (Oral)   Ht 5\' 3"  (1.6 m)   Wt 141 lb (64 kg)   SpO2 98%   BMI 24.98 kg/m   BP Readings from Last 3 Encounters:  02/22/17 122/80  12/21/16 116/62  09/26/16 112/70    Wt Readings from Last 3 Encounters:  02/22/17 141 lb (64 kg)  12/21/16 144 lb (65.3 kg)  09/26/16 141 lb 6.4 oz (64.1 kg)    Physical Exam  Constitutional: She appears well-developed. No distress.  HENT:  Head: Normocephalic.  Right Ear: External ear normal.  Left Ear: External ear normal.  Nose: Nose normal.  Mouth/Throat: Oropharynx is clear and moist.  Eyes: Pupils are equal, round, and reactive to light. Conjunctivae are normal. Right eye exhibits no discharge. Left eye exhibits no discharge.  Neck: Normal range of motion. Neck supple. No JVD present. No tracheal deviation present. No thyromegaly present.  Cardiovascular: Normal rate, regular rhythm and normal heart sounds.   Pulmonary/Chest: No stridor. No  respiratory distress. She has no wheezes.  Abdominal: Soft. Bowel sounds are normal. She exhibits no distension and no mass. There is no tenderness. There is no rebound and no guarding.  Musculoskeletal: She exhibits no edema or tenderness.  Lymphadenopathy:    She has no cervical adenopathy.  Neurological: She displays normal reflexes. No cranial nerve deficit. She exhibits normal muscle tone. Coordination normal.  Skin: No rash noted. No erythema.  Psychiatric: She has a normal mood and affect. Her behavior is normal. Judgment and thought content normal.  L lat knee w/synovial thickening  Lab Results  Component Value Date   WBC 7.4 06/27/2016    HGB 12.1 06/27/2016   HCT 37.0 06/27/2016   PLT 424 (H) 06/27/2016   GLUCOSE 86 06/27/2016   CHOL 146 04/25/2016   TRIG 93 04/25/2016   HDL 60 04/25/2016   LDLDIRECT 162.1 10/28/2009   LDLCALC 68 04/25/2016   ALT 11 04/25/2016   AST 17 04/25/2016   NA 139 06/27/2016   K 4.5 06/27/2016   CL 103 06/27/2016   CREATININE 1.32 (H) 06/27/2016   BUN 29 (H) 06/27/2016   CO2 27 06/27/2016   TSH 1.65 12/23/2015   INR 0.96 04/15/2016    No results found.  Assessment & Plan:   There are no diagnoses linked to this encounter. I have discontinued Ms. Peoples's ALIGN, predniSONE, and famotidine. I am also having her maintain her psyllium, losartan, clopidogrel, loperamide, leflunomide, levothyroxine, metoprolol succinate, acetaminophen, and atorvastatin.  No orders of the defined types were placed in this encounter.    Follow-up: No Follow-up on file.  [name], MD

## 2017-02-22 NOTE — Assessment & Plan Note (Signed)
Plavix, Lipitor

## 2017-02-27 ENCOUNTER — Other Ambulatory Visit (INDEPENDENT_AMBULATORY_CARE_PROVIDER_SITE_OTHER): Payer: Medicare Other

## 2017-02-27 DIAGNOSIS — Z0000 Encounter for general adult medical examination without abnormal findings: Secondary | ICD-10-CM | POA: Diagnosis not present

## 2017-02-27 DIAGNOSIS — R197 Diarrhea, unspecified: Secondary | ICD-10-CM | POA: Diagnosis not present

## 2017-02-27 DIAGNOSIS — G459 Transient cerebral ischemic attack, unspecified: Secondary | ICD-10-CM

## 2017-02-27 DIAGNOSIS — M069 Rheumatoid arthritis, unspecified: Secondary | ICD-10-CM | POA: Diagnosis not present

## 2017-02-27 DIAGNOSIS — I10 Essential (primary) hypertension: Secondary | ICD-10-CM | POA: Diagnosis not present

## 2017-02-27 LAB — URINALYSIS
Bilirubin Urine: NEGATIVE
Hgb urine dipstick: NEGATIVE
Ketones, ur: NEGATIVE
Leukocytes, UA: NEGATIVE
Nitrite: NEGATIVE
TOTAL PROTEIN, URINE-UPE24: NEGATIVE
URINE GLUCOSE: NEGATIVE
Urobilinogen, UA: 0.2 (ref 0.0–1.0)
pH: 6 (ref 5.0–8.0)

## 2017-02-27 LAB — LIPID PANEL
CHOL/HDL RATIO: 2
Cholesterol: 165 mg/dL (ref 0–200)
HDL: 69.8 mg/dL (ref >39.00)
LDL CALC: 76 mg/dL (ref 0–99)
NonHDL: 95.18
TRIGLYCERIDES: 96 mg/dL (ref 0.0–149.0)
VLDL: 19.2 mg/dL (ref 0.0–40.0)

## 2017-02-27 LAB — CBC WITH DIFFERENTIAL/PLATELET
Basophils Absolute: 0.2 10*3/uL — ABNORMAL HIGH (ref 0.0–0.1)
Basophils Relative: 2.3 % (ref 0.0–3.0)
EOS ABS: 0.5 10*3/uL (ref 0.0–0.7)
Eosinophils Relative: 6.5 % — ABNORMAL HIGH (ref 0.0–5.0)
HEMATOCRIT: 41.3 % (ref 36.0–46.0)
HEMOGLOBIN: 13.2 g/dL (ref 12.0–15.0)
LYMPHS PCT: 34.2 % (ref 12.0–46.0)
Lymphs Abs: 2.7 10*3/uL (ref 0.7–4.0)
MCHC: 32 g/dL (ref 30.0–36.0)
MCV: 94.5 fl (ref 78.0–100.0)
MONOS PCT: 10.4 % (ref 3.0–12.0)
Monocytes Absolute: 0.8 10*3/uL (ref 0.1–1.0)
Neutro Abs: 3.6 10*3/uL (ref 1.4–7.7)
Neutrophils Relative %: 46.6 % (ref 43.0–77.0)
Platelets: 350 10*3/uL (ref 150.0–400.0)
RBC: 4.37 Mil/uL (ref 3.87–5.11)
RDW: 15.1 % (ref 11.5–15.5)
WBC: 7.8 10*3/uL (ref 4.0–10.5)

## 2017-02-27 LAB — HEPATIC FUNCTION PANEL
ALT: 39 U/L — ABNORMAL HIGH (ref 0–35)
AST: 31 U/L (ref 0–37)
Albumin: 4.1 g/dL (ref 3.5–5.2)
Alkaline Phosphatase: 126 U/L — ABNORMAL HIGH (ref 39–117)
BILIRUBIN DIRECT: 0.2 mg/dL (ref 0.0–0.3)
Total Bilirubin: 0.5 mg/dL (ref 0.2–1.2)
Total Protein: 6.7 g/dL (ref 6.0–8.3)

## 2017-02-27 LAB — BASIC METABOLIC PANEL
BUN: 30 mg/dL — ABNORMAL HIGH (ref 6–23)
CALCIUM: 10.2 mg/dL (ref 8.4–10.5)
CO2: 27 mEq/L (ref 19–32)
CREATININE: 1.26 mg/dL — AB (ref 0.40–1.20)
Chloride: 105 mEq/L (ref 96–112)
GFR: 43.58 mL/min — AB (ref >60.00)
GLUCOSE: 92 mg/dL (ref 70–99)
Potassium: 4.5 mEq/L (ref 3.5–5.1)
Sodium: 142 mEq/L (ref 135–145)

## 2017-02-27 LAB — TSH: TSH: 0.06 u[IU]/mL — ABNORMAL LOW (ref 0.35–4.50)

## 2017-05-11 ENCOUNTER — Other Ambulatory Visit: Payer: Self-pay

## 2017-05-11 ENCOUNTER — Other Ambulatory Visit: Payer: Self-pay | Admitting: Surgery

## 2017-05-11 DIAGNOSIS — I728 Aneurysm of other specified arteries: Secondary | ICD-10-CM

## 2017-05-15 ENCOUNTER — Other Ambulatory Visit: Payer: Self-pay

## 2017-05-15 ENCOUNTER — Other Ambulatory Visit: Payer: Self-pay | Admitting: Surgery

## 2017-05-15 DIAGNOSIS — I728 Aneurysm of other specified arteries: Secondary | ICD-10-CM

## 2017-05-16 MED FILL — SHINGRIX VIAL KIT: 50 | 1 days supply | Qty: 1 | Fill #0

## 2017-05-17 ENCOUNTER — Other Ambulatory Visit: Payer: Self-pay | Admitting: Cardiothoracic Surgery

## 2017-05-17 DIAGNOSIS — R911 Solitary pulmonary nodule: Secondary | ICD-10-CM

## 2017-05-22 ENCOUNTER — Ambulatory Visit
Admission: RE | Admit: 2017-05-22 | Discharge: 2017-05-22 | Disposition: A | Discharge status: Home / Self Care | Payer: Medicare Other | Source: Ambulatory Visit | Attending: Surgery | Admitting: Surgery

## 2017-05-22 ENCOUNTER — Other Ambulatory Visit: Payer: Self-pay

## 2017-05-22 DIAGNOSIS — I728 Aneurysm of other specified arteries: Secondary | ICD-10-CM

## 2017-05-27 ENCOUNTER — Ambulatory Visit
Admission: RE | Admit: 2017-05-27 | Discharge: 2017-05-27 | Disposition: A | Discharge status: Home / Self Care | Payer: Medicare Other | Source: Ambulatory Visit | Attending: Surgery | Admitting: Surgery

## 2017-05-27 DIAGNOSIS — I728 Aneurysm of other specified arteries: Secondary | ICD-10-CM

## 2017-05-29 ENCOUNTER — Other Ambulatory Visit: Payer: Self-pay | Admitting: Surgery

## 2017-05-29 DIAGNOSIS — I728 Aneurysm of other specified arteries: Secondary | ICD-10-CM

## 2017-06-04 ENCOUNTER — Ambulatory Visit: Payer: Medicare Other | Admitting: Surgery

## 2017-06-05 ENCOUNTER — Ambulatory Visit
Admission: RE | Admit: 2017-06-05 | Discharge: 2017-06-05 | Disposition: A | Discharge status: Home / Self Care | Payer: Medicare Other | Source: Ambulatory Visit | Attending: Surgery | Admitting: Surgery

## 2017-06-05 ENCOUNTER — Ambulatory Visit: Admission: RE | Admit: 2017-06-05 | Payer: Medicare Other | Source: Ambulatory Visit

## 2017-06-05 DIAGNOSIS — I728 Aneurysm of other specified arteries: Secondary | ICD-10-CM

## 2017-06-18 ENCOUNTER — Encounter: Payer: Self-pay | Admitting: Surgery

## 2017-06-18 ENCOUNTER — Other Ambulatory Visit: Payer: Self-pay

## 2017-06-18 ENCOUNTER — Ambulatory Visit: Payer: Medicare Other | Admitting: Surgery

## 2017-06-18 VITALS — BP 124/78 | HR 70 | Temp 97.1°F | Resp 16 | Ht 63.0 in | Wt 151.0 lb

## 2017-06-18 DIAGNOSIS — I728 Aneurysm of other specified arteries: Secondary | ICD-10-CM

## 2017-06-18 NOTE — Progress Notes (Signed)
[facility] [place]  Patient name: [name] MRN: [id number] DOB: [birth year] Sex: female   REASON FOR VISIT:    Follow up  Dana:    The patient is back today because she recently underwent a workup for abdominal pain. I inherited her from Dr. Amedeo Plenty in 2011. 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 Leafy Kindle in 1996 for rupture. She has been followed with MR A. since that time to evaluate her mesenteric and splenic aneurysms. 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 She has been treated in the past with steroids for polyarteritis or fibromuscular disease. Currently she is not on steroids.   The patient reports no interval change.  She denies any abdominal pain.  PAST MEDICAL HISTORY:   Past Medical History:  Diagnosis Date  . Abdominal aortic aneurysm (HCC)    REPAIRED IN 1996 BY [name]  AND HAS RECENTLY BEEN FOLLOWED BY [name]  . 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 Adenomatous colon polyp    tubular  . Anemia   . CAD in native artery 1996, 2002, 2005    Status post CABG x1 with LIMA-LAD for ostial LAD 90% stenosis --> down to 50% in 2002 and 30% in 2005.;  Atretic LIMA; Myoview 06/2010: Fixed anteroseptal, apical and inferoapical defect with moderate size. Most likely scar. Mild subendocardial ischemia. EF 71% LOW RISK.   [name] Cervical disc disease   . Chronic kidney disease   . COPD (chronic obstructive pulmonary disease) ([place])   . Diverticulosis   . Hyperlipidemia   . Hypertension   . Hypothyroidism (acquired)    hypo  . Myocardial infarction [facility]) Aug. 2016   TIA  . Polymyalgia rheumatica (HCC)    2011 Dr. Charlestine Night  . Rheumatoid arthritis ([place]) 2011   Dr.Truslow; fracture knees,  hands and wrists -   . S/P CABG x 1 1996   CABG--LIMA-LAD for ostial LAD (not felt to be PCI amenable). EF NORMAL then; LIMA now atretic  . Shortness of breath dyspnea    with exertion  . Stroke [facility]) 11-2014   TIA   . Urinary frequency      FAMILY HISTORY:   Family History  Problem Relation Age of Onset  . Hypertension Mother   . Diabetes Mother   . Heart disease Mother   . Hyperlipidemia Mother   . Stroke Father   . Hyperlipidemia Sister   . Hypertension Sister   . Hypertension Daughter   . Cancer Paternal Uncle        Deceased from cancer not sure of site  . Hypertension Son   . Hyperlipidemia Son   . Hyperlipidemia Son   . Hypertension Son   . Hyperlipidemia Son   . Hypertension Son   . Hypertension Unknown   . Coronary artery disease Unknown   . Asthma Neg Hx   . Colon cancer Neg Hx     SOCIAL HISTORY:   Social History   Tobacco Use  . Smoking status: Former Smoker    Last attempt to quit: 12/04/1958    Years since quitting: 58.5  . Smokeless tobacco: Never Used  Substance Use Topics  . Alcohol use: No     ALLERGIES:   Allergies  Allergen Reactions  . Aspirin  Other (See Comments)    Hurts stomach  . Codeine Nausea And Vomiting  . Nitroglycerin Other (See Comments)    Heart rate drops     CURRENT MEDICATIONS:   Current Outpatient Medications  Medication Sig Dispense Refill  . acetaminophen (TYLENOL) 500 MG tablet Take 500 mg by mouth every 6 (six) hours as needed.    [name] atorvastatin (LIPITOR) 80 MG tablet Take 1 tablet (80 mg total) by mouth daily. 90 tablet 3  . clopidogrel (PLAVIX) 75 MG tablet Take 1 tablet (75 mg total) by mouth daily. 90 tablet 3  . leflunomide (ARAVA) 20 MG tablet Take 20 mg by mouth daily.    [name] levothyroxine (SYNTHROID, LEVOTHROID) 88 MCG tablet Take 1 tablet (88 mcg total) by mouth daily before breakfast. 90 tablet 3  . loperamide (IMODIUM A-D) 2 MG tablet Take 2 mg by mouth 4 (four) times daily as needed for diarrhea  or loose stools.    [name] losartan (COZAAR) 25 MG tablet Take 1 tablet (25 mg total) by mouth daily. 90 tablet 3  . metoprolol succinate (TOPROL-XL) 25 MG 24 hr tablet Take 1 tablet (25 mg total) by mouth daily. 90 tablet 3  . psyllium (METAMUCIL) 58.6 % packet Take 1 packet by mouth daily.    [name] triamcinolone (NASACORT ALLERGY 24HR) 55 MCG/ACT AERO nasal inhaler Place 2 sprays into the nose daily.     No current facility-administered medications for this visit.     REVIEW OF SYSTEMS:   [X]  denotes positive finding, [ ]  denotes negative finding Cardiac  Comments:  Chest pain or chest pressure:    Shortness of breath upon exertion: x   Short of breath when lying flat:    Irregular heart rhythm:        Vascular    Pain in calf, thigh, or hip brought on by ambulation:    Pain in feet at night that wakes you up from your sleep:     Blood clot in your veins:    Leg swelling:         Pulmonary    Oxygen at home:    Productive cough:     Wheezing:         Neurologic    Sudden weakness in arms or legs:     Sudden numbness in arms or legs:     Sudden onset of difficulty speaking or slurred speech:    Temporary loss of vision in one eye:     Problems with dizziness:         Gastrointestinal    Blood in stool:     Vomited blood:         Genitourinary    Burning when urinating:     Blood in urine:        Psychiatric    Major depression:         Hematologic    Bleeding problems:    Problems with blood clotting too easily:        Skin    Rashes or ulcers:        Constitutional    Fever or chills:      PHYSICAL EXAM:   Vitals:   06/18/17 1546  BP: 124/78  Pulse: 70  Resp: 16  Temp: (!) 97.1 F (36.2 C)  TempSrc: Oral  SpO2: 98%  Weight: 151 lb (68.5 kg)  Height: 5\' 3"  (1.6 m)    GENERAL: The patient is a well-nourished female, in no acute distress.  The vital signs are documented above. CARDIAC: There is a regular rate and rhythm.  VASCULAR: No carotid  bruits. PULMONARY: Non-labored respirations ABDOMEN: Soft and non-tender.  No abdominal bruits.  No pulsatile mass. MUSCULOSKELETAL: There are no major deformities or cyanosis. NEUROLOGIC: No focal weakness or paresthesias are detected. SKIN: There are no ulcers or rashes noted. PSYCHIATRIC: The patient has a normal affect.  STUDIES:   I have reviewed her MRI with the following findings: 1. Limited by the absence of intravenous contrast and motion artifact. Please note that intravenous gadolinium contrast can be safely administered without risk of contrast induced nephropathy or degradation of renal function as long as the GFR remains above 30. 2. A stable focal short segment high-grade stenosis versus occlusion of the celiac artery with tandem regions of poststenotic dilatation measuring 10 and 9 mm respectively. No interval change. Stable ectasia of the common hepatic artery to 6 mm. 3. Stable changes of fibromuscular dysplasia involving the mid and distal aspect of the right main renal artery. 4. 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 5. The spleen is surgically absent and the splenic artery remains ligated. 6. Bilateral renal cysts  MEDICAL ISSUES:   History of splenic artery aneurysm: Again, there has been no significant interval changes by MRI.  We will repeat the study in 2 years.    Annamarie Major, MD [facility] [phone] Pager [phone]

## 2017-06-21 ENCOUNTER — Ambulatory Visit: Payer: Medicare Other | Admitting: Cardiology

## 2017-06-21 ENCOUNTER — Encounter: Payer: Self-pay | Admitting: Cardiology

## 2017-06-21 VITALS — BP 118/64 | HR 70 | Ht 63.0 in | Wt 150.0 lb

## 2017-06-21 DIAGNOSIS — I10 Essential (primary) hypertension: Secondary | ICD-10-CM | POA: Diagnosis not present

## 2017-06-21 DIAGNOSIS — Z951 Presence of aortocoronary bypass graft: Secondary | ICD-10-CM | POA: Diagnosis not present

## 2017-06-21 DIAGNOSIS — E785 Hyperlipidemia, unspecified: Secondary | ICD-10-CM

## 2017-06-21 DIAGNOSIS — I2510 Atherosclerotic heart disease of native coronary artery without angina pectoris: Secondary | ICD-10-CM | POA: Diagnosis not present

## 2017-06-21 NOTE — Patient Instructions (Signed)
NO CHANGE WITH CURRENT MEDICATION      Your physician wants you to follow-up in [place]. You will receive a reminder letter in the mail two months in advance. If you don't receive a letter, please call our office to schedule the follow-up appointment.   If you need a refill on your cardiac medications before your next appointment, please call your pharmacy.

## 2017-06-21 NOTE — Progress Notes (Signed)
PCP: Plotnikov, [name], MD  Clinic Note: Chief Complaint  Patient presents with  . Follow-up    No major complaint  . Coronary Artery Disease  . PAD    AAA, splenic artery aneurysm    HPI: [name] is a 77 y.o. female with a PMH below who presents today for Annual CAD follow-up.  She had single vessel CABG (LIMA-LAD) in 1996 as well as a history of AAA rupture s/p repair - in conjunction with splenic artery mesenteric artery stenosis.  She is being followed by vascular surgery -Dr.Brabham.  Follow-up catheterizations have shown an atretic LIMA with improved LAD flow with now 50% ostial LAD. She is a former smoker who quit in 1960. Referred to pulmonary medicine for new cough.  [place] x-ray suggested pulmonary nodule.  [name] was last seen in February 2018 - doing relatively well, recovering from COPD exacerbation in Dec 17.  Recent Hospitalizations: December 17 COPD exacerbation. Apparently she was sick for most of October November December. Only had shortness of breath beyond her baseline at that time.  Studies Reviewed: - none.  ?planned Myoview in 2017 not done - decided that she did not need b/c known atretic LIMA & no active CAD Sx.  Interval History: [name] presents here today for annual follow-up doing quite well overall from a cardiac standpoint.  She has baseline exertional dyspnea that has not really changed but has not had any chest tightness pressure with rest or exertion.  She has not been out as much exercise he is left knee pain that is Caused her to gain little bit of weight.  She has been less active and more deconditioned.  She will probably get short of breath if she goes upstairs, but this is probably in part related to her knee hurting. She really denies any resting or exertional chest tightness/pressure.  No resting dyspnea.  No PND, orthopnea or edema.  Remainder of Cardiovascular ROS: positive for - dyspnea on exertion negative for - chest  pain, edema, irregular heartbeat, murmur, orthopnea, palpitations, paroxysmal nocturnal dyspnea, rapid heart rate, shortness of breath or Syncope/near syncope/TIA/amaurosis fugax, claudication.   ROS: A comprehensive was performed. Review of Systems  Constitutional: Negative for malaise/fatigue (Just noticing some deconditioning, limited by knee pain.).  HENT: Negative for congestion and sinus pain.   Respiratory: Negative for cough (Occasional morning cough, but nothing) and wheezing.   Gastrointestinal: Negative for blood in stool and constipation.  Genitourinary: Negative for hematuria.  Musculoskeletal: Positive for joint pain (Significant left knee pain). Negative for falls.  Neurological: Positive for dizziness (Occasionally with position change).  Endo/Heme/Allergies: Does not bruise/bleed easily.  Psychiatric/Behavioral: Positive for memory loss. Negative for depression. The patient is not nervous/anxious and does not have insomnia.   All other systems reviewed and are negative.   Past Medical History:  Diagnosis Date  . Abdominal aortic aneurysm (HCC)    REPAIRED IN 1996 BY [name]  AND HAS RECENTLY BEEN FOLLOWED BY [name]  . 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 Adenomatous colon polyp    tubular  . Anemia   . CAD in native artery 1996, 2002, 2005    Status post CABG x1 with LIMA-LAD for ostial LAD 90% stenosis --> down to 50% in 2002 and 30% in 2005.;  Atretic LIMA; Myoview 06/2010: Fixed anteroseptal, apical and inferoapical defect with moderate size. Most likely scar. Mild  subendocardial ischemia. EF 71% LOW RISK.   [name] Cervical disc disease   . Chronic kidney disease   . COPD (chronic obstructive pulmonary disease) ([place])   . Diverticulosis   . Hyperlipidemia   . Hypertension   . Hypothyroidism (acquired)    hypo  . Myocardial infarction [facility]) Aug. 2016   TIA  . Polymyalgia rheumatica  (HCC)    2011 Dr. Charlestine Night  . Rheumatoid arthritis ([place]) 2011   Dr.Truslow; fracture knees, hands and wrists -   . S/P CABG x 1 1996   CABG--LIMA-LAD for ostial LAD (not felt to be PCI amenable). EF NORMAL then; LIMA now atretic  . Shortness of breath dyspnea    with exertion  . Stroke [facility]) 11-2014   TIA   . Urinary frequency     Past Surgical History:  Procedure Laterality Date  . ABDOMINAL AORTIC ANEURYSM REPAIR  5993   Complicated by mesenteric artery stenosis and splenic artery infarction with acquired Asplenia  . APPENDECTOMY    . BUNIONECTOMY  07/2011   right foot  . CARDIAC CATHETERIZATION  2005   (Most recent CATH) - ostial LAD lesion 20-30% (down from 90% initially). Atretic LIMA. Minimal disease the RCA and Circumflex system.  [name] CARPAL TUNNEL RELEASE Left   . CATARACT EXTRACTION Bilateral   . CERVICAL SPINE SURGERY     plate 2008 Dr. Saintclair Halsted  . [place]   INCLUDED AN INTERNAL MAMMARY ARTERY TO THE LAD. EF WAS NORMAL  . INGUINAL HERNIA REPAIR Right   . LAPAROSCOPIC APPENDECTOMY N/A 06/28/2016   Procedure: APPENDECTOMY LAPAROSCOPIC;  Surgeon: Leighton Ruff, MD;  Location: [facility]S;  Service: General;  Laterality: N/A;  . NM MYOVIEW LTD  March 2012   Fixed anteroseptal, apical and inferoapical defect with moderate size. Most likely scar. Mild subendocardial ischemia. EF 71% LOW RISK.   [name] SPLENECTOMY    . TRANSTHORACIC ECHOCARDIOGRAM  12/2014   [facility]: Normal LV size & function. EF 55-60%,   . vagina polyp    . VIDEO ASSISTED THORACOSCOPY (VATS)/WEDGE RESECTION Left 03/02/2015   Procedure: VIDEO ASSISTED THORACOSCOPY (VATS), MINI THORACOTOMY, LEFT UPPER LOBE WEDGE, TAKE DOWN OF INTERNAL MAMMARY LESIONS, PLACEMENT OF ON-Q PUMP;  Surgeon: Grace Isaac, MD;  Location: [place];  Service: Thoracic;  Laterality: Left;  [name] VIDEO BRONCHOSCOPY N/A 03/02/2015   Procedure: BRONCHOSCOPY;  Surgeon: Grace Isaac, MD;  [facility] OR;  Service:  Thoracic;  Laterality: N/A;    Current Meds  Medication Sig  . acetaminophen (TYLENOL) 500 MG tablet Take 500 mg by mouth every 6 (six) hours as needed.  [name] atorvastatin (LIPITOR) 80 MG tablet Take 1 tablet (80 mg total) by mouth daily.  . CHLORPHENIRAMINE-ACETAMINOPHEN PO Take 1 tablet by mouth 2 (two) times daily.  . clopidogrel (PLAVIX) 75 MG tablet Take 1 tablet (75 mg total) by mouth daily.  [name] leflunomide (ARAVA) 20 MG tablet Take 20 mg by mouth daily.  [name] levothyroxine (SYNTHROID, LEVOTHROID) 88 MCG tablet Take 1 tablet (88 mcg total) by mouth daily before breakfast.  . loperamide (IMODIUM A-D) 2 MG tablet Take 2 mg by mouth 4 (four) times daily as needed for diarrhea or loose stools.  [name] losartan (COZAAR) 25 MG tablet Take 1 tablet (25 mg total) by mouth daily.  . metoprolol succinate (TOPROL-XL) 25 MG 24 hr tablet Take 1 tablet (25 mg total) by mouth daily.  . psyllium (METAMUCIL) 58.6 % packet Take 1 packet by mouth  daily.  . triamcinolone (NASACORT ALLERGY 24HR) 55 MCG/ACT AERO nasal inhaler Place 2 sprays into the nose daily.    Allergies  Allergen Reactions  . Aspirin Other (See Comments)    Hurts stomach  . Codeine Nausea And Vomiting  . Nitroglycerin Other (See Comments)    Heart rate drops    Social History   Socioeconomic History  . Marital status: Married    Spouse name: None  . Number of children: 4  . Years of education: None  . Highest education level: None  Social Needs  . Financial resource strain: None  . Food insecurity - worry: None  . Food insecurity - inability: None  . Transportation needs - medical: None  . Transportation needs - non-medical: None  Occupational History  . Occupation: retired    Fish farm manager: RETIRED  Tobacco Use  . Smoking status: Former Smoker    Last attempt to quit: 12/04/1958    Years since quitting: 58.5  . Smokeless tobacco: Never Used  Substance and Sexual Activity  . Alcohol use: No  . Drug use: No  . Sexual activity:  Not Currently  Other Topics Concern  . None  Social History Narrative   [facility]  MARRIED -RETIRED ; [place]. GOES TO [place] 3 DAYS A WEEK FOR ABOUT 45 MIN. PER SESSION      Living at [facility] since dec 2015    family history includes Cancer in her paternal uncle; Coronary artery disease in her unknown relative; Diabetes in her mother; Heart disease in her mother; Hyperlipidemia in her mother, sister, son, son, and son; Hypertension in her daughter, mother, sister, son, son, son, and unknown relative; Stroke in her father.  Wt Readings from Last 3 Encounters:  06/21/17 150 lb (68 kg)  06/18/17 151 lb (68.5 kg)  02/22/17 141 lb (64 kg)    PHYSICAL EXAM BP 118/64   Pulse 70   Ht 5\' 3"  (1.6 m)   Wt 150 lb (68 kg)   BMI 26.57 kg/m   Physical Exam  Constitutional: She is oriented to person, place, and time. She appears well-developed and well-nourished. No distress.  Healthy-appearing.  Well-groomed  HENT:  Head: Normocephalic and atraumatic.  Mouth/Throat: No oropharyngeal exudate.  Neck: Normal range of motion. Neck supple. No hepatojugular reflux and no JVD present. Carotid bruit is not present.  Cardiovascular: Normal rate, regular rhythm, normal heart sounds, intact distal pulses and normal pulses.  No extrasystoles are present. PMI is not displaced. Exam reveals no gallop and no friction rub.  No murmur heard. Pulmonary/Chest: Effort normal and breath sounds normal. No respiratory distress. She exhibits no tenderness.  Mild interstitial sounds, faint wheezing but no rales or rhonchi.  Abdominal: Soft. Bowel sounds are normal. She exhibits no distension. There is no tenderness. There is no rebound.  No abdominal bruit  Musculoskeletal: Normal range of motion. She exhibits no edema.  Neurological: She is alert and oriented to person, place, and time.  Psychiatric: She has a normal mood and affect. Her behavior is normal.  Judgment and thought content normal.  Nursing note and vitals reviewed.    Adult ECG Report  Rate: 70 ;  Rhythm: normal sinus rhythm and Normal axis, intervals and duration;   Narrative Interpretation: Stable EKG  Other studies Reviewed: Additional studies/ records that were reviewed today include:  Recent Labs:  Lab Results  Component Value Date   CHOL 165 02/27/2017   HDL 69.80  02/27/2017   Alpha 76 02/27/2017   LDLDIRECT 162.1 10/28/2009   TRIG 96.0 02/27/2017   CHOLHDL 2 02/27/2017    ASSESSMENT / PLAN: Problem List Items Addressed This Visit    Atherosclerosis of coronary artery without angina pectoris - Primary (Chronic)    Distant history of LIMA to LAD CABG for what was thought to be ostial LAD disease.  Follow-up cardiac catheterizations have shown atretic LIMA with decreased disease in the LAD now to not physiologically significant.  She does not have any active angina symptoms.  We talked about the not she would need to have recurrent evaluation since she now is essentially not a CABG patient anymore.  There are no vein grafts to follow.  Therefore in the absence of any recurrent anginal symptoms, with decline further stress testing evaluation.  Continue aspirin, beta-blocker, statin and ARB.      Essential hypertension (Chronic)    Well-controlled on current dose of beta-blocker and ARB      Hyperlipidemia with target LDL less than 70 (Chronic)    Pretty close to goal on current dose of atorvastatin.  LDL 76.  Would like it to be lower, but has been up and down between the 50s-70s LDL.  Continue to monitor and may require more aggressive treatment if the levels start to increase.      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; now atretic and significantly improved LAD lesion without intervention (Chronic)    On somewhat interestingly, the LAD lesion apparently stabilized and became less significant leading to an atretic LIMA.  Now in fact not a CABG  patient.         Current medicines are reviewed at length with the patient today. (+/- concerns) n/a The following changes have been made: n/a  Patient Instructions  NO CHANGE WITH CURRENT MEDICATION      Your physician wants you to follow-up in [place]. You will receive a reminder letter in the mail two months in advance. If you don't receive a letter, please call our office to schedule the follow-up appointment.   If you need a refill on your cardiac medications before your next appointment, please call your pharmacy.    Studies Ordered:   No orders of the defined types were placed in this encounter.     Glenetta Hew, M.D., M.S. Interventional Cardiologist   Pager # [phone] Phone # [phone] [address] [place], [place] [address]

## 2017-06-23 ENCOUNTER — Encounter: Payer: Self-pay | Admitting: Cardiology

## 2017-06-23 NOTE — Assessment & Plan Note (Signed)
On somewhat interestingly, the LAD lesion apparently stabilized and became less significant leading to an atretic LIMA.  Now in fact not a CABG patient.

## 2017-06-23 NOTE — Assessment & Plan Note (Signed)
Pretty close to goal on current dose of atorvastatin.  LDL 76.  Would like it to be lower, but has been up and down between the 50s-70s LDL.  Continue to monitor and may require more aggressive treatment if the levels start to increase.

## 2017-06-23 NOTE — Assessment & Plan Note (Signed)
Distant history of LIMA to LAD CABG for what was thought to be ostial LAD disease.  Follow-up cardiac catheterizations have shown atretic LIMA with decreased disease in the LAD now to not physiologically significant.  She does not have any active angina symptoms.  We talked about the not she would need to have recurrent evaluation since she now is essentially not a CABG patient anymore.  There are no vein grafts to follow.  Therefore in the absence of any recurrent anginal symptoms, with decline further stress testing evaluation.  Continue aspirin, beta-blocker, statin and ARB.

## 2017-06-23 NOTE — Assessment & Plan Note (Signed)
Well-controlled on current dose of beta-blocker and ARB

## 2017-06-26 NOTE — Addendum Note (Signed)
Addended by: Zebedee Iba on: 06/26/2017 07:39 AM   Modules accepted: Orders

## 2017-06-28 ENCOUNTER — Ambulatory Visit: Payer: Medicare Other | Admitting: Cardiothoracic Surgery

## 2017-06-28 ENCOUNTER — Other Ambulatory Visit: Payer: Self-pay

## 2017-06-28 ENCOUNTER — Ambulatory Visit
Admission: RE | Admit: 2017-06-28 | Discharge: 2017-06-28 | Disposition: A | Discharge status: Home / Self Care | Payer: Medicare Other | Source: Ambulatory Visit | Attending: Cardiothoracic Surgery | Admitting: Cardiothoracic Surgery

## 2017-06-28 ENCOUNTER — Encounter: Payer: Self-pay | Admitting: Cardiothoracic Surgery

## 2017-06-28 VITALS — BP 180/90 | HR 89 | Resp 18 | Ht 63.0 in | Wt 149.6 lb

## 2017-06-28 DIAGNOSIS — R911 Solitary pulmonary nodule: Secondary | ICD-10-CM | POA: Diagnosis not present

## 2017-06-28 DIAGNOSIS — J8410 Pulmonary fibrosis, unspecified: Secondary | ICD-10-CM | POA: Diagnosis not present

## 2017-06-28 NOTE — Progress Notes (Signed)
[address]       [place] [address]             [phone]      Danajah E Zundel [place] Medical Record #[id number] Date of Birth: 10-Oct-1940  Referring: Tanda Rockers, MD Primary Care: Cassandria Anger, MD  Chief Complaint:   POST OP FOLLOW UP 03/02/2015 OPERATIVE REPORT PREOPERATIVE DIAGNOSIS: Left upper lobe lung mass POSTOPERATIVE DIAGNOSIS: Left upper lobe lung mass. 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 PROCEDURE PERFORMED: Video bronchoscopy, left video-assisted thoracoscopy, minithoracotomy, wedge resection of left upper lobe. SURGEON: Lanelle Bal, M.D Previous left VATS lung resection Lung, wedge biopsy/resection, left upper lobe - NECROTIZING GRANULOMA, 1.7 CM. - NO TUMOR SEEN. - SEE COMMENT. Microscopic Comment Sections demonstrate a 1.7 cm necrotizing granuloma. There is associated reactive pneumocyte hyperplasia with reactive atypia but no true tumor is identified. An AFB stain is performed on a representative section which is negative for acid fast bacilli. A GMS and PAS stain are also performed on a representative section which are both negative for fungal organisms. Although specimen stains for microorganisms are negative, stains are not as sensitive as other microbiologic techniques including culture. Please correlate with culture results. (RAH:gt, 03/04/15) [name] Pathologist, Electronic Signature (Case signed 03/04/2015  History of Present Illness:     Patient was  admitted to [facility] in December 2017 with respiratory problems  , a CT scan of the chest was done at that time the report notes recurrent malignancy and old stable line, however the patient has no previous history of lung cancer nor lobectomy.  Repeat ct of chest done with enlarging are left upper lobe pleural based, not at area of previous wedge resection (patient did not have lobectomy as some notes indicate, this was  interpreted as recurrent malignancy however the patient previous surgery was not related to malignancy, she has no previous history of lung cancer.  Patient returns today for follow-up CT of the chest  Since last seen the patient has had no health issues over the past year but denies cough hemoptysis or recurrent pneumonia.   She did smoke for 5-7 years but quit at age 78. With her limited smoking history she would not be eligible for lung cancer screening CT program.  Past Medical History:  Diagnosis Date  . Abdominal aortic aneurysm (HCC)    REPAIRED IN 1996 BY [name]  AND HAS RECENTLY BEEN FOLLOWED BY [name]  . 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 Adenomatous colon polyp    tubular  . Anemia   . CAD in native artery 1996, 2002, 2005    Status post CABG x1 with LIMA-LAD for ostial LAD 90% stenosis --> down to 50% in 2002 and 30% in 2005.;  Atretic LIMA; Myoview 06/2010: Fixed anteroseptal, apical and inferoapical defect with moderate size. Most likely scar. Mild subendocardial ischemia. EF 71% LOW RISK.   [name] Cervical disc disease   . Chronic kidney disease   . COPD (chronic obstructive pulmonary disease) ([place])   . Diverticulosis   . Hyperlipidemia   . Hypertension   . Hypothyroidism (acquired)    hypo  . Myocardial infarction [facility]) Aug. 2016   TIA  . Polymyalgia rheumatica (HCC)    2011 Dr. Charlestine Night  . Rheumatoid arthritis ([place]) 2011   Dr.Truslow; fracture knees, hands and wrists -   .  S/P CABG x 1 1996   CABG--LIMA-LAD for ostial LAD (not felt to be PCI amenable). EF NORMAL then; LIMA now atretic  . Shortness of breath dyspnea    with exertion  . Stroke [facility]) 11-2014   TIA   . Urinary frequency      Social History   Tobacco Use  Smoking Status Former Smoker  . Last attempt to quit: 12/04/1958  . Years since quitting: 58.6  Smokeless Tobacco Never Used    Social History   Substance  and Sexual Activity  Alcohol Use No     Allergies  Allergen Reactions  . Aspirin Other (See Comments)    Hurts stomach  . Codeine Nausea And Vomiting  . Nitroglycerin Other (See Comments)    Heart rate drops    Current Outpatient Medications  Medication Sig Dispense Refill  . acetaminophen (TYLENOL) 500 MG tablet Take 500 mg by mouth every 6 (six) hours as needed.    [name] atorvastatin (LIPITOR) 80 MG tablet Take 1 tablet (80 mg total) by mouth daily. 90 tablet 3  . CHLORPHENIRAMINE-ACETAMINOPHEN PO Take 1 tablet by mouth 2 (two) times daily.    . clopidogrel (PLAVIX) 75 MG tablet Take 1 tablet (75 mg total) by mouth daily. 90 tablet 3  . leflunomide (ARAVA) 20 MG tablet Take 20 mg by mouth daily.    [name] levothyroxine (SYNTHROID, LEVOTHROID) 88 MCG tablet Take 1 tablet (88 mcg total) by mouth daily before breakfast. 90 tablet 3  . loperamide (IMODIUM A-D) 2 MG tablet Take 2 mg by mouth 4 (four) times daily as needed for diarrhea or loose stools.    [name] losartan (COZAAR) 25 MG tablet Take 1 tablet (25 mg total) by mouth daily. 90 tablet 3  . metoprolol succinate (TOPROL-XL) 25 MG 24 hr tablet Take 1 tablet (25 mg total) by mouth daily. 90 tablet 3  . psyllium (METAMUCIL) 58.6 % packet Take 1 packet by mouth daily.    [name] triamcinolone (NASACORT ALLERGY 24HR) 55 MCG/ACT AERO nasal inhaler Place 2 sprays into the nose daily.     No current facility-administered medications for this visit.     Review of Systems  Constitutional: Negative.   HENT: Negative.   Eyes: Negative.   Respiratory: Negative.   Cardiovascular: Negative.   Gastrointestinal: Negative.   Genitourinary: Negative.   Musculoskeletal: Negative.   Skin: Negative.   Neurological: Negative.   Endo/Heme/Allergies: Negative.   Psychiatric/Behavioral: Negative.      Physical Exam: BP (!) 180/90 (BP Location: Left Arm, Patient Position: Sitting, Cuff Size: Large)   Pulse 89   Resp 18   Ht 5\' 3"  (1.6 m)   Wt 149 lb 9.6 oz  (67.9 kg)   SpO2 95% Comment: RA  BMI 26.50 kg/m  General appearance: alert and cooperative Head: Normocephalic, without obvious abnormality, atraumatic Neck: no adenopathy, no carotid bruit, no JVD, supple, symmetrical, trachea midline and thyroid not enlarged, symmetric, no tenderness/mass/nodules Lymph nodes: Cervical, supraclavicular, and axillary nodes normal. Resp: clear to auscultation bilaterally Back: symmetric, no curvature. ROM normal. No CVA tenderness. Cardio: regular rate and rhythm, S1, S2 normal, no murmur, click, rub or gallop GI: soft, non-tender; bowel sounds normal; no masses,  no organomegaly Extremities: extremities normal, atraumatic, no cyanosis or edema Neurologic: Grossly normal    Diagnostic Studies & Laboratory data:     Recent Radiology Findings:  Ct Chest Wo Contrast  Result Date: 06/28/2017 CLINICAL DATA:  Follow-up lung nodules. EXAM: CT CHEST WITHOUT CONTRAST TECHNIQUE:  Multidetector CT imaging of the chest was performed following the standard protocol without IV contrast. COMPARISON:  CT scan of June 08, 2016 and April 15, 2016. FINDINGS: Cardiovascular: Atherosclerosis of thoracic aorta is noted without aneurysm formation. Normal cardiac size. No pericardial effusion is noted. Status post coronary artery bypass graft. Mediastinum/Nodes: No enlarged mediastinal or axillary lymph nodes. Thyroid gland, trachea, and esophagus demonstrate no significant findings. Lungs/Pleura: No pneumothorax or pleural effusion is noted. Stable right apical scarring is noted. Irregular density is seen medially in left upper lobe which is decreased compared to prior exam and most consistent with postoperative changes. New 5 mm nodule is noted posteriorly in left lung apex best seen on image number 21 of series 4. In the left lower lobe posteriorly, there is a 11.1 x 10.4 mm nodule (11 mm mean diameter) on image 116 of series 4. This was not present on prior exam. Upper  Abdomen: No acute abnormality. Musculoskeletal: No chest wall mass or suspicious bone lesions identified. IMPRESSION: Interval development of 11 mm nodule in left lower lobe concerning for malignancy. Also noted is new 5 mm nodule in left lung apex. PET scan is recommended for further evaluation. These results will be called to the ordering clinician or representative by the Radiologist Assistant, and communication documented in the PACS or zVision Dashboard. Aortic Atherosclerosis (ICD10-I70.0). Electronically Signed   By: Marijo Conception, M.D.   On: 06/28/2017 09:36   I have independently reviewed the above radiology studies  and reviewed the findings with the patient.  Ct Chest Wo Contrast  Result Date: 06/08/2016 CLINICAL DATA:  Patient with shortness of breath. Prior wedge resection. Follow-up pulmonary nodule. EXAM: CT CHEST WITHOUT CONTRAST TECHNIQUE: Multidetector CT imaging of the chest was performed following the standard protocol without IV contrast. COMPARISON:  CT chest 04/15/2016. FINDINGS: Cardiovascular: Aortic atherosclerosis. Heart is mildly enlarged. No pericardial effusion. Coronary arterial vascular calcifications. Mediastinum/Nodes: Normal appearing esophagus. No enlarged axillary, mediastinal or hilar adenopathy. Lungs/Pleura: Central airways are patent. Patchy ground-glass opacities within the lower lobes bilaterally. Unchanged perifissural 7 mm nodule within the left mid lung (image 47; series 2). Unchanged 7 mm perifissural nodule within the left mid lung (image 57; series 4). Unchanged 5 mm perifissural nodule (image 39; series 4) left mid lung. Postsurgical changes within the medial left upper lobe. Significant interval decrease in size and near complete resolution of previously described soft tissue along the suture line within the medial left upper lobe (image 25; series 4). No pleural effusion or pneumothorax. Upper Abdomen: No acute process. Musculoskeletal: No aggressive or  acute appearing osseous lesions. Status post median sternotomy. Thoracic spine degenerative changes. IMPRESSION: Interval decrease in size and near complete resolution of previously described soft tissue along the suture line within the medial left upper lobe, most suggestive of resolving infectious process. Stable perifissural nodules within the left mid lung. Recommend additional follow-up chest CT in 18- 24 months. Electronically Signed   By: Lovey Newcomer M.D.   On: 06/08/2016 14:57    Dg Chest 2 View  Result Date: 04/17/2016 CLINICAL DATA:  Productive cough for the past month. EXAM: CHEST  2 VIEW COMPARISON:  04/15/2016; 07/22/2015; chest CT - 04/15/2016 FINDINGS: Grossly unchanged cardiac silhouette and mediastinal contours post median sternotomy. Atherosclerotic plaque within the thoracic aorta. The lungs remain hyperexpanded with flattening of the diaphragms and thinning of the biapical pulmonary parenchyma. Known ill-defined soft tissue about the left upper lobe surgical resection site is not well demonstrated on the  present examination. No focal airspace opacities. No pleural effusion or pneumothorax. No evidence of edema. IMPRESSION: 1. Hyperexpanded lungs without acute cardiopulmonary disease. Emphysema. (ICD10-J43.9) 2. Previously identified soft tissue about the left upper lobe surgical resection site is suboptimally evaluated on the present examination. Correlation with report from chest CT performed 04/15/2016 is recommended. Electronically Signed   By: Sandi Mariscal M.D.   On: 04/17/2016 15:42     Ct Angio Chest Pe W And/or Wo Contrast  Result Date: 04/15/2016 CLINICAL DATA:  Elevated D-dimer.  Productive cough congestion. EXAM: CT ANGIOGRAPHY CHEST WITH CONTRAST TECHNIQUE: Multidetector CT imaging of the chest was performed using the standard protocol during bolus administration of intravenous contrast. Multiplanar CT image reconstructions and MIPs were obtained to evaluate the vascular  anatomy. CONTRAST:  80 cc Isovue 370 IV COMPARISON:  11/23/2015 FINDINGS: Cardiovascular: No filling defects in the pulmonary arteries to suggest pulmonary emboli. Heart is normal size. Prior CABG. Aorta is tortuous with scattered calcifications, non aneurysmal. Mediastinum/Nodes: Mildly enlarged mediastinal lymph nodes. Subcarinal lymph node has a short axis diameter of 13 mm. Other small scattered paratracheal and AP window lymph nodes. No hilar or axillary adenopathy. Lungs/Pleura: Increasing density noted in the medial left upper lobe in the area of prior postoperative change. This soft tissue measures 2.2 x 1.7 cm on image 15. Given the location in the postoperative bed, this is concerning for tumor recurrence. Nodule in the left lower lobe on image 38 measures 8 mm on image 38, stable. Scarring noted in the lung bases. No pleural effusions. Upper Abdomen: Imaging into the upper abdomen shows no acute findings. Musculoskeletal: Chest wall soft tissues are unremarkable. No acute bony abnormality or focal bone lesion. Review of the MIP images confirms the above findings. IMPRESSION: Enlarging abnormal soft tissue in the left upper lobe at the prior surgical site concerning for recurrent tumor. This measures up to 2.2 cm. This could be further evaluated with PET CT. Stable left lower lobe pulmonary nodule. Borderline and mildly enlarged mediastinal lymph nodes. Electronically Signed   By: Rolm Baptise M.D.   On: 04/15/2016 15:26    Ct Chest Wo Contrast  Result Date: 11/23/2015 CLINICAL DATA:  Followup left lung nodule. Previous wedge resection revealed necrotizing granuloma. EXAM: CT CHEST WITHOUT CONTRAST TECHNIQUE: Multidetector CT imaging of the chest was performed following the standard protocol without IV contrast. COMPARISON:  01/29/2015 FINDINGS: Cardiovascular: Heart size within normal limits. Aortic atherosclerosis noted. Previous CABG. Mediastinum/Lymph Nodes: No masses or pathologically enlarged  lymph nodes identified on this un-enhanced exam. Lungs/Pleura: Postop changes seen in the medial left upper lobe from previous wedge resection. 8 mm mean diameter perifissural nodule nodule is seen in the left midlung on image 66/4. This is new since previous study, but likely represents an intrapulmonary lymph node. Mild centrilobular emphysema noted. Bibasilar scarring and tiny sub-cm posterior right lower lobe pulmonary nodule remains stable. No evidence of acute infiltrate or pleural effusion. Upper abdomen: No acute findings. Musculoskeletal: No chest wall mass or suspicious bone lesions identified. IMPRESSION: Postop changes in left upper lobe. 8 mm mean diameter perifissural nodule in the left midlung is new, but likely represents an intrapulmonary lymph node. Non-contrast chest CT at 6-12 months is recommended. If the nodule is stable at time of repeat CT, then future CT at 18-24 months (from today's scan) is considered optional for low-risk patients, but is recommended for high-risk patients. This recommendation follows the consensus statement: Guidelines for Management of Incidental Pulmonary Nodules Detected on  CT Images:From the Fleischner Society 2017; published online before print (10.1148/radiol.[id number]). Aortic atherosclerosis and emphysema. Electronically Signed   By: Earle Gell M.D.   On: 11/23/2015 15:46    Recent Lab Findings: Lab Results  Component Value Date   WBC 7.8 02/27/2017   HGB 13.2 02/27/2017   HCT 41.3 02/27/2017   PLT 350.0 02/27/2017   GLUCOSE 92 02/27/2017   CHOL 165 02/27/2017   TRIG 96.0 02/27/2017   HDL 69.80 02/27/2017   LDLDIRECT 162.1 10/28/2009   LDLCALC 76 02/27/2017   ALT 39 (H) 02/27/2017   AST 31 02/27/2017   NA 142 02/27/2017   K 4.5 02/27/2017   CL 105 02/27/2017   CREATININE 1.26 (H) 02/27/2017   BUN 30 (H) 02/27/2017   CO2 27 02/27/2017   TSH 0.06 (L) 02/27/2017   INR 0.96 04/15/2016   10/2015   03/2016   PFT's 10 /2016 FEV1 1.58  80% DLCO 16.27 71%  Interpretation: The FEV1 is normal, but the FEV1/FVC ratio and FEF25-75% are reduced. While the TLC, FRC and SVC are within normal limits, the RV is increased. Following administration of bronchodilators, there is a slight response. The reduced diffusing capacity indicates a mild loss of functional alveolar capillary surface. However, the diffusing capacity was not corrected for the patient's hemoglobin. 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 So far on cultures and smears no specific organisms have been identified.  Follow-up CT scan done today reveals Interval development of 11 mm nodule in left lower lobe  And  new 5 mm nodule in left lung apex  Will obtain PET scan and see back after the scan is completed, we will have the current CT saved in a super D format for possible navigation bronchoscopy.  Grace Isaac MD      [address] [place] [address] Office [phone]   Beeper [phone]  06/28/2017 11:23 AM

## 2017-06-29 ENCOUNTER — Other Ambulatory Visit: Payer: Self-pay | Admitting: Cardiothoracic Surgery

## 2017-06-29 DIAGNOSIS — R918 Other nonspecific abnormal finding of lung field: Secondary | ICD-10-CM

## 2017-07-04 ENCOUNTER — Encounter (HOSPITAL_COMMUNITY)
Admission: RE | Admit: 2017-07-04 | Discharge: 2017-07-04 | Disposition: A | Discharge status: Home / Self Care | Payer: Medicare Other | Source: Ambulatory Visit | Attending: Cardiothoracic Surgery | Admitting: Cardiothoracic Surgery

## 2017-07-04 DIAGNOSIS — R918 Other nonspecific abnormal finding of lung field: Secondary | ICD-10-CM | POA: Diagnosis present

## 2017-07-04 LAB — GLUCOSE, CAPILLARY: Glucose-Capillary: 100 mg/dL — ABNORMAL HIGH (ref 65–99)

## 2017-07-04 MED ORDER — FLUDEOXYGLUCOSE F - 18 (FDG) INJECTION
7.7700 | Freq: Once | INTRAVENOUS | Status: AC | PRN
  Administered 2017-07-04: 7.77 via INTRAVENOUS

## 2017-07-08 IMAGING — CR DG CHEST 2V
2 series · 2 of 2 positions shown · non-contrast
Comparison: None.

CLINICAL DATA: Seven day history of cough

EXAM:
CHEST  2 VIEW

[PA]
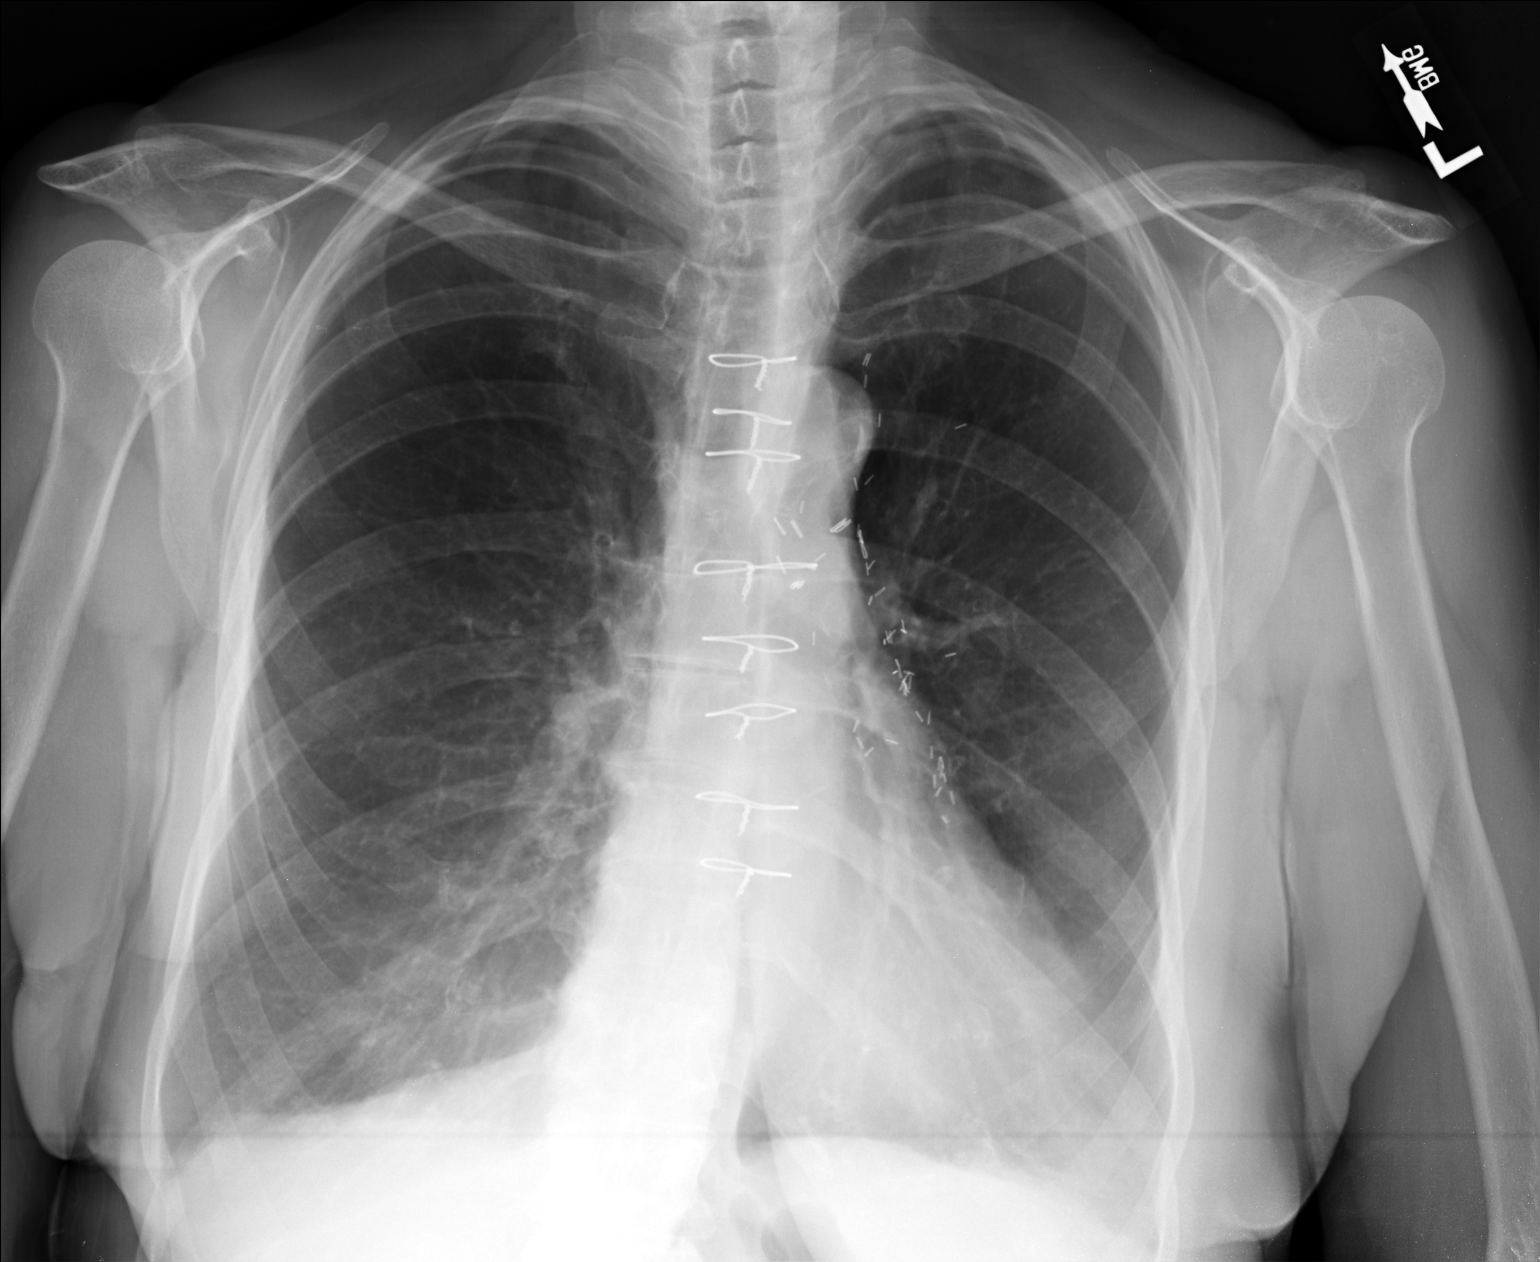

[lateral]
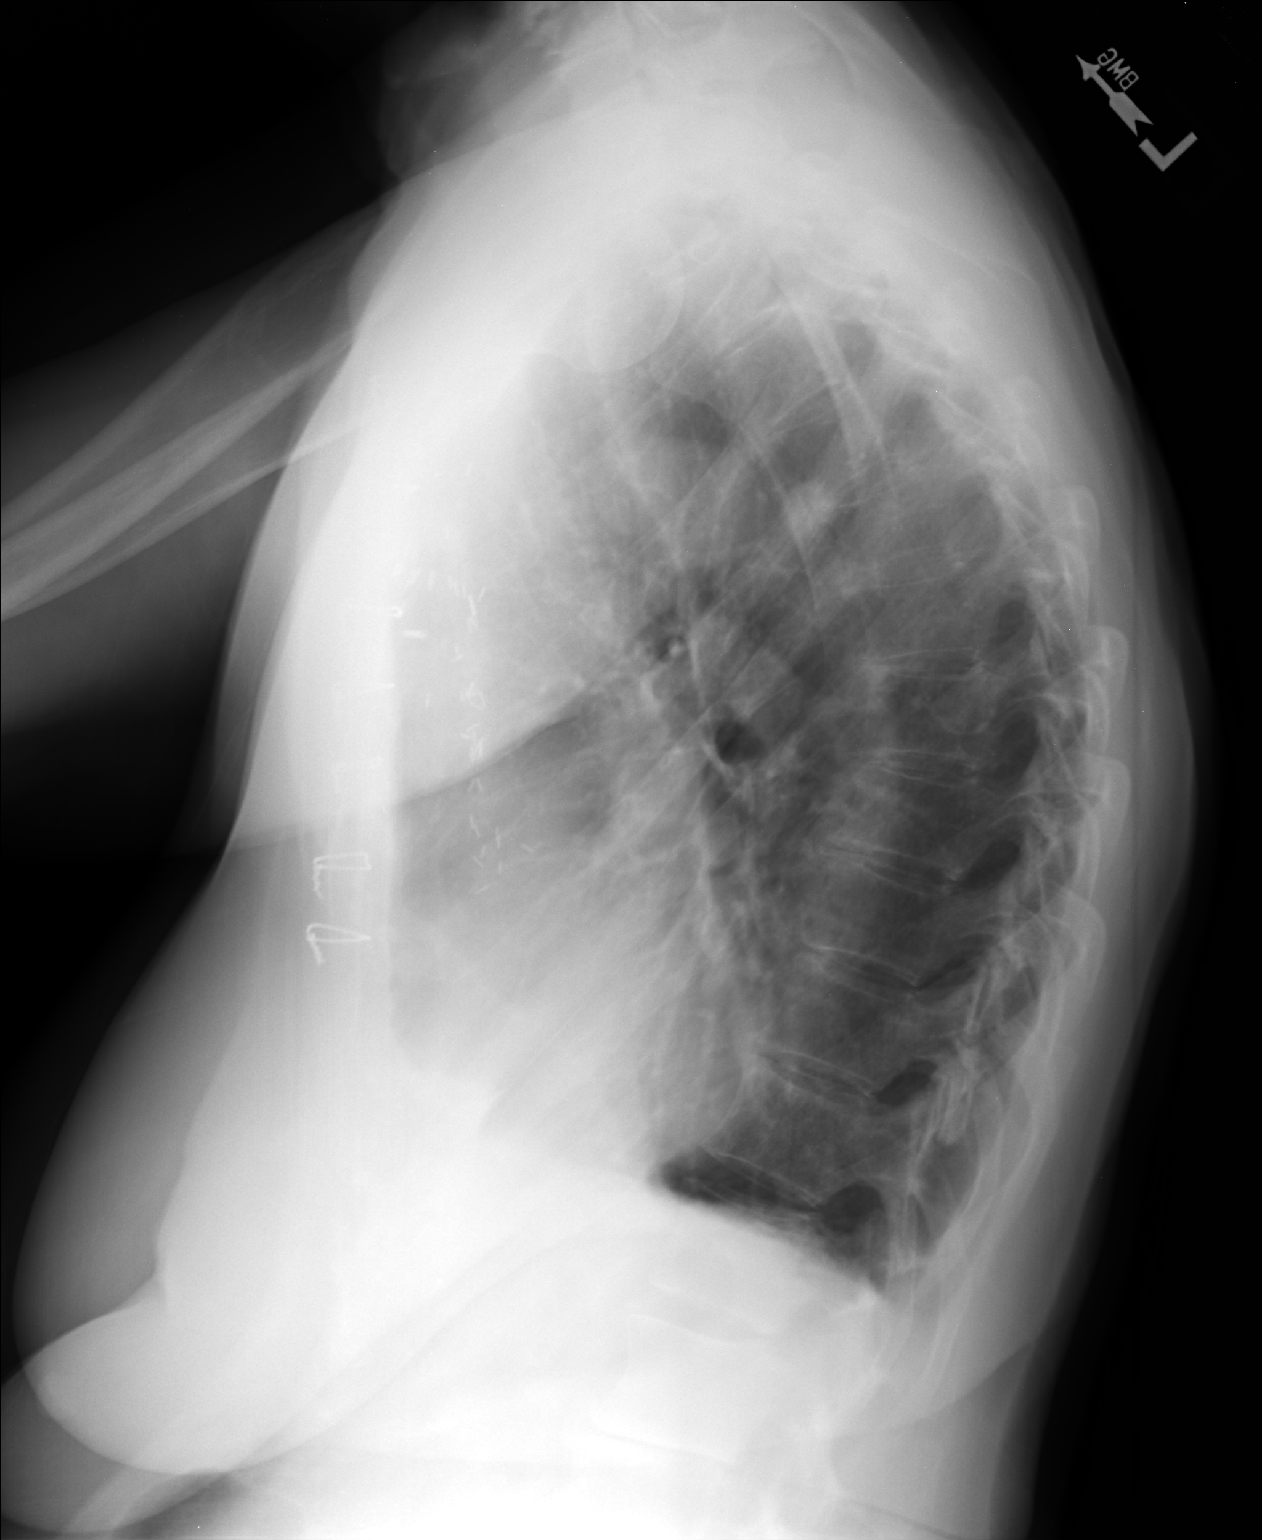

[2 of 2 positions shown; findings below may reference images not displayed]

FINDINGS: Lungs are somewhat hyperexpanded. There is no edema or
consolidation. On the lateral view, there is a nodular opacity
measuring 1.5 x 1.4 cm at the level of the anterior upper thoracic
spine. This opacity is not seen on the frontal view.

Patient is status post internal mammary bypass grafting. Heart size
and pulmonary vascularity are within normal limits. No adenopathy.
No bone lesions.
IMPRESSION: 1.5 x 1.4 cm nodular opacity at the level of the anterior upper
thoracic spine seen on the lateral view. This finding warrants
noncontrast enhanced chest CT to further assess.

Lungs are hyperexpanded.  No edema or consolidation.

These results will be called to the ordering clinician or
representative by the Radiologist Assistant, and communication
documented in the PACS or zVision Dashboard.

## 2017-07-10 ENCOUNTER — Ambulatory Visit: Payer: Medicare Other | Admitting: Cardiothoracic Surgery

## 2017-07-11 ENCOUNTER — Other Ambulatory Visit: Payer: Self-pay | Admitting: *Deleted

## 2017-07-11 ENCOUNTER — Ambulatory Visit: Payer: Medicare Other | Admitting: Cardiothoracic Surgery

## 2017-07-11 VITALS — BP 130/66 | HR 88 | Resp 20 | Ht 63.0 in | Wt 148.0 lb

## 2017-07-11 DIAGNOSIS — J8410 Pulmonary fibrosis, unspecified: Secondary | ICD-10-CM | POA: Diagnosis not present

## 2017-07-11 DIAGNOSIS — R911 Solitary pulmonary nodule: Secondary | ICD-10-CM | POA: Diagnosis not present

## 2017-07-11 DIAGNOSIS — R918 Other nonspecific abnormal finding of lung field: Secondary | ICD-10-CM

## 2017-07-12 NOTE — Progress Notes (Signed)
[address]       [place] [address]             [phone]      Jessina E Burback [place] Medical Record #[id number] Date of Birth: November 29, 1940  Referring: Tanda Rockers, MD Primary Care: Cassandria Anger, MD  Chief Complaint:   POST OP FOLLOW UP 03/02/2015 OPERATIVE REPORT PREOPERATIVE DIAGNOSIS: Left upper lobe lung mass POSTOPERATIVE DIAGNOSIS: Left upper lobe lung mass. 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 PROCEDURE PERFORMED: Video bronchoscopy, left video-assisted thoracoscopy, minithoracotomy, wedge resection of left upper lobe. SURGEON: Lanelle Bal, M.D Previous left VATS lung resection Lung, wedge biopsy/resection, left upper lobe - NECROTIZING GRANULOMA, 1.7 CM. - NO TUMOR SEEN. - SEE COMMENT. Microscopic Comment Sections demonstrate a 1.7 cm necrotizing granuloma. There is associated reactive pneumocyte hyperplasia with reactive atypia but no true tumor is identified. An AFB stain is performed on a representative section which is negative for acid fast bacilli. A GMS and PAS stain are also performed on a representative section which are both negative for fungal organisms. Although specimen stains for microorganisms are negative, stains are not as sensitive as other microbiologic techniques including culture. Please correlate with culture results. (RAH:gt, 03/04/15) [name] Pathologist, Electronic Signature (Case signed 03/04/2015  History of Present Illness:     Patient was  admitted to [facility] long hospital in December 2017 with respiratory problems  , a CT scan of the chest was done at that time the report notes recurrent malignancy and old stable line, however the patient has no previous history of lung cancer nor lobectomy.  Repeat ct of chest done with enlarging are left upper lobe pleural based, not at area of previous wedge resection (patient did not have lobectomy as some notes indicate, this was  interpreted as recurrent malignancy however the patient previous surgery was not related to malignancy, she has no previous history of lung cancer.  Patient returns today for follow-up CT of the chest  Since last seen the patient has had no health issues over the past year but denies cough hemoptysis or recurrent pneumonia.   She did smoke for 5-7 years but quit at age 1. With her limited smoking history she would not be eligible for lung cancer screening CT program.  Past Medical History:  Diagnosis Date  . Abdominal aortic aneurysm (HCC)    REPAIRED IN 1996 BY [name]  AND HAS RECENTLY BEEN FOLLOWED BY [name]  . 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 Adenomatous colon polyp    tubular  . Anemia   . CAD in native artery 1996, 2002, 2005    Status post CABG x1 with LIMA-LAD for ostial LAD 90% stenosis --> down to 50% in 2002 and 30% in 2005.;  Atretic LIMA; Myoview 06/2010: Fixed anteroseptal, apical and inferoapical defect with moderate size. Most likely scar. Mild subendocardial ischemia. EF 71% LOW RISK.   [name] Cervical disc disease   . Chronic kidney disease   . COPD (chronic obstructive pulmonary disease) ([place])   . Diverticulosis   . Hyperlipidemia   . Hypertension   . Hypothyroidism (acquired)    hypo  . Myocardial infarction [facility]) Aug. 2016   TIA  . Polymyalgia rheumatica (HCC)    2011 Dr. Charlestine Night  . Rheumatoid arthritis ([place]) 2011   Dr.Truslow; fracture knees, hands and wrists -   .  S/P CABG x 1 1996   CABG--LIMA-LAD for ostial LAD (not felt to be PCI amenable). EF NORMAL then; LIMA now atretic  . Shortness of breath dyspnea    with exertion  . Stroke [facility]) 11-2014   TIA   . Urinary frequency      Social History   Tobacco Use  Smoking Status Former Smoker  . Last attempt to quit: 12/04/1958  . Years since quitting: 58.6  Smokeless Tobacco Never Used    Social History   Substance  and Sexual Activity  Alcohol Use No     Allergies  Allergen Reactions  . Aspirin Other (See Comments)    Hurts stomach  . Codeine Nausea And Vomiting  . Nitroglycerin Other (See Comments)    Heart rate drops    Current Outpatient Medications  Medication Sig Dispense Refill  . acetaminophen (TYLENOL) 500 MG tablet Take 500 mg by mouth every 6 (six) hours as needed.    [name] atorvastatin (LIPITOR) 80 MG tablet Take 1 tablet (80 mg total) by mouth daily. 90 tablet 3  . CHLORPHENIRAMINE-ACETAMINOPHEN PO Take 1 tablet by mouth 2 (two) times daily.    . clopidogrel (PLAVIX) 75 MG tablet Take 1 tablet (75 mg total) by mouth daily. 90 tablet 3  . leflunomide (ARAVA) 20 MG tablet Take 20 mg by mouth daily.    [name] levothyroxine (SYNTHROID, LEVOTHROID) 88 MCG tablet Take 1 tablet (88 mcg total) by mouth daily before breakfast. 90 tablet 3  . loperamide (IMODIUM A-D) 2 MG tablet Take 2 mg by mouth 4 (four) times daily as needed for diarrhea or loose stools.    [name] losartan (COZAAR) 25 MG tablet Take 1 tablet (25 mg total) by mouth daily. 90 tablet 3  . metoprolol succinate (TOPROL-XL) 25 MG 24 hr tablet Take 1 tablet (25 mg total) by mouth daily. 90 tablet 3  . psyllium (METAMUCIL) 58.6 % packet Take 1 packet by mouth daily.    [name] triamcinolone (NASACORT ALLERGY 24HR) 55 MCG/ACT AERO nasal inhaler Place 2 sprays into the nose daily.     No current facility-administered medications for this visit.     Review of Systems  Constitutional: Negative.   HENT: Negative.   Eyes: Negative.   Respiratory: Negative.   Cardiovascular: Negative.   Gastrointestinal: Negative.   Genitourinary: Negative.   Musculoskeletal: Negative.   Skin: Negative.   Neurological: Negative.   Endo/Heme/Allergies: Negative.   Psychiatric/Behavioral: Negative.      Physical Exam: BP 130/66   Pulse 88   Resp 20   Ht 5\' 3"  (1.6 m)   Wt 148 lb (67.1 kg)   SpO2 97% Comment: RA  BMI 26.22 kg/m  General appearance: alert  and cooperative Head: Normocephalic, without obvious abnormality, atraumatic Neck: no adenopathy, no carotid bruit, no JVD, supple, symmetrical, trachea midline and thyroid not enlarged, symmetric, no tenderness/mass/nodules Lymph nodes: Cervical, supraclavicular, and axillary nodes normal. Resp: clear to auscultation bilaterally Back: symmetric, no curvature. ROM normal. No CVA tenderness. Cardio: regular rate and rhythm, S1, S2 normal, no murmur, click, rub or gallop GI: soft, non-tender; bowel sounds normal; no masses,  no organomegaly Extremities: extremities normal, atraumatic, no cyanosis or edema Neurologic: Grossly normal   Diagnostic Studies & Laboratory data:     Recent Radiology Findings:  Ct Chest Wo Contrast  Result Date: 06/28/2017 CLINICAL DATA:  Follow-up lung nodules. EXAM: CT CHEST WITHOUT CONTRAST TECHNIQUE: Multidetector CT imaging of the chest was performed following the standard protocol without IV  contrast. COMPARISON:  CT scan of June 08, 2016 and April 15, 2016. FINDINGS: Cardiovascular: Atherosclerosis of thoracic aorta is noted without aneurysm formation. Normal cardiac size. No pericardial effusion is noted. Status post coronary artery bypass graft. Mediastinum/Nodes: No enlarged mediastinal or axillary lymph nodes. Thyroid gland, trachea, and esophagus demonstrate no significant findings. Lungs/Pleura: No pneumothorax or pleural effusion is noted. Stable right apical scarring is noted. Irregular density is seen medially in left upper lobe which is decreased compared to prior exam and most consistent with postoperative changes. New 5 mm nodule is noted posteriorly in left lung apex best seen on image number 21 of series 4. In the left lower lobe posteriorly, there is a 11.1 x 10.4 mm nodule (11 mm mean diameter) on image 116 of series 4. This was not present on prior exam. Upper Abdomen: No acute abnormality. Musculoskeletal: No chest wall mass or suspicious bone  lesions identified. IMPRESSION: Interval development of 11 mm nodule in left lower lobe concerning for malignancy. Also noted is new 5 mm nodule in left lung apex. PET scan is recommended for further evaluation. These results will be called to the ordering clinician or representative by the Radiologist Assistant, and communication documented in the PACS or zVision Dashboard. Aortic Atherosclerosis (ICD10-I70.0). Electronically Signed   By: Marijo Conception, M.D.   On: 06/28/2017 09:36   Nm Pet Image Restag (ps) Skull Base To Thigh  Result Date: 07/05/2017 CLINICAL DATA:  Subsequent treatment strategy for lung nodule. EXAM: NUCLEAR MEDICINE PET SKULL BASE TO THIGH TECHNIQUE: 7.8 mCi F-18 FDG was injected intravenously. Full-ring PET imaging was performed from the skull base to thigh after the radiotracer. CT data was obtained and used for attenuation correction and anatomic localization. Fasting blood glucose: 100 mg/dl Mediastinal blood pool activity: SUV max 3.2 COMPARISON:  CT chest 06/28/2017, 06/08/2016, 04/15/2016, PET 02/10/2015 and CT chest 01/29/2015. FINDINGS: NECK: No hypermetabolic lymph nodes in the neck. Incidental CT findings: None. CHEST: Nodular scarring in the medial left upper lobe does not show abnormal hypermetabolism. A 10 mm round nodule in the inferior left lower lobe has an SUV max 3.4, is stable in size from 06/28/2017 but new from CT chest 06/08/2016. No hypermetabolic mediastinal, hilar or axillary lymph nodes. Incidental CT findings: Atherosclerotic calcification of the arterial vasculature. Heart is enlarged. No pericardial or pleural effusion. 6 mm apical left upper lobe nodule (series 8, image 11), too small for PET resolution. ABDOMEN/PELVIS: No abnormal hypermetabolism in the liver, adrenal glands, spleen or pancreas. No hypermetabolic lymph nodes. Incidental CT findings: Low-attenuation lesions in the kidneys are difficult to further characterize without post-contrast imaging.  Splenectomy. SKELETON: No abnormal osseous hypermetabolism. Incidental CT findings: Degenerative changes in the spine. IMPRESSION: 1. Mildly hypermetabolic left lower lobe nodule, stable in size from 06/28/2017 but new from 06/08/2016. Findings are indeterminate with both malignant and infectious etiologies considered, given the short interval from 06/28/2017. Consider follow-up CT chest in 3-4 weeks in further initial evaluation. 2. 6 mm apical left upper lobe nodule is too small for PET resolution. 3.  Aortic atherosclerosis (ICD10-170.0). Electronically Signed   By: Lorin Picket M.D.   On: 07/05/2017 09:09   I have independently reviewed the above radiology studies  and reviewed the findings with the patient.   Dg Chest 2 View  Result Date: 04/17/2016 CLINICAL DATA:  Productive cough for the past month. EXAM: CHEST  2 VIEW COMPARISON:  04/15/2016; 07/22/2015; chest CT - 04/15/2016 FINDINGS: Grossly unchanged cardiac silhouette and mediastinal  contours post median sternotomy. Atherosclerotic plaque within the thoracic aorta. The lungs remain hyperexpanded with flattening of the diaphragms and thinning of the biapical pulmonary parenchyma. Known ill-defined soft tissue about the left upper lobe surgical resection site is not well demonstrated on the present examination. No focal airspace opacities. No pleural effusion or pneumothorax. No evidence of edema. IMPRESSION: 1. Hyperexpanded lungs without acute cardiopulmonary disease. Emphysema. (ICD10-J43.9) 2. Previously identified soft tissue about the left upper lobe surgical resection site is suboptimally evaluated on the present examination. Correlation with report from chest CT performed 04/15/2016 is recommended. Electronically Signed   By: Sandi Mariscal M.D.   On: 04/17/2016 15:42     Ct Angio Chest Pe W And/or Wo Contrast  Result Date: 04/15/2016 CLINICAL DATA:  Elevated D-dimer.  Productive cough congestion. EXAM: CT ANGIOGRAPHY CHEST WITH  CONTRAST TECHNIQUE: Multidetector CT imaging of the chest was performed using the standard protocol during bolus administration of intravenous contrast. Multiplanar CT image reconstructions and MIPs were obtained to evaluate the vascular anatomy. CONTRAST:  80 cc Isovue 370 IV COMPARISON:  11/23/2015 FINDINGS: Cardiovascular: No filling defects in the pulmonary arteries to suggest pulmonary emboli. Heart is normal size. Prior CABG. Aorta is tortuous with scattered calcifications, non aneurysmal. Mediastinum/Nodes: Mildly enlarged mediastinal lymph nodes. Subcarinal lymph node has a short axis diameter of 13 mm. Other small scattered paratracheal and AP window lymph nodes. No hilar or axillary adenopathy. Lungs/Pleura: Increasing density noted in the medial left upper lobe in the area of prior postoperative change. This soft tissue measures 2.2 x 1.7 cm on image 15. Given the location in the postoperative bed, this is concerning for tumor recurrence. Nodule in the left lower lobe on image 38 measures 8 mm on image 38, stable. Scarring noted in the lung bases. No pleural effusions. Upper Abdomen: Imaging into the upper abdomen shows no acute findings. Musculoskeletal: Chest wall soft tissues are unremarkable. No acute bony abnormality or focal bone lesion. Review of the MIP images confirms the above findings. IMPRESSION: Enlarging abnormal soft tissue in the left upper lobe at the prior surgical site concerning for recurrent tumor. This measures up to 2.2 cm. This could be further evaluated with PET CT. Stable left lower lobe pulmonary nodule. Borderline and mildly enlarged mediastinal lymph nodes. Electronically Signed   By: Rolm Baptise M.D.   On: 04/15/2016 15:26    Ct Chest Wo Contrast  Result Date: 11/23/2015 CLINICAL DATA:  Followup left lung nodule. Previous wedge resection revealed necrotizing granuloma. EXAM: CT CHEST WITHOUT CONTRAST TECHNIQUE: Multidetector CT imaging of the chest was performed  following the standard protocol without IV contrast. COMPARISON:  01/29/2015 FINDINGS: Cardiovascular: Heart size within normal limits. Aortic atherosclerosis noted. Previous CABG. Mediastinum/Lymph Nodes: No masses or pathologically enlarged lymph nodes identified on this un-enhanced exam. Lungs/Pleura: Postop changes seen in the medial left upper lobe from previous wedge resection. 8 mm mean diameter perifissural nodule nodule is seen in the left midlung on image 66/4. This is new since previous study, but likely represents an intrapulmonary lymph node. Mild centrilobular emphysema noted. Bibasilar scarring and tiny sub-cm posterior right lower lobe pulmonary nodule remains stable. No evidence of acute infiltrate or pleural effusion. Upper abdomen: No acute findings. Musculoskeletal: No chest wall mass or suspicious bone lesions identified. IMPRESSION: Postop changes in left upper lobe. 8 mm mean diameter perifissural nodule in the left midlung is new, but likely represents an intrapulmonary lymph node. Non-contrast chest CT at 6-12 months is recommended. If the  nodule is stable at time of repeat CT, then future CT at 18-24 months (from today's scan) is considered optional for low-risk patients, but is recommended for high-risk patients. This recommendation follows the consensus statement: Guidelines for Management of Incidental Pulmonary Nodules Detected on CT Images:From the Fleischner Society 2017; published online before print (10.1148/radiol.3474259563). Aortic atherosclerosis and emphysema. Electronically Signed   By: Earle Gell M.D.   On: 11/23/2015 15:46    Recent Lab Findings: Lab Results  Component Value Date   WBC 7.8 02/27/2017   HGB 13.2 02/27/2017   HCT 41.3 02/27/2017   PLT 350.0 02/27/2017   GLUCOSE 92 02/27/2017   CHOL 165 02/27/2017   TRIG 96.0 02/27/2017   HDL 69.80 02/27/2017   LDLDIRECT 162.1 10/28/2009   LDLCALC 76 02/27/2017   ALT 39 (H) 02/27/2017   AST 31 02/27/2017   NA  142 02/27/2017   K 4.5 02/27/2017   CL 105 02/27/2017   CREATININE 1.26 (H) 02/27/2017   BUN 30 (H) 02/27/2017   CO2 27 02/27/2017   TSH 0.06 (L) 02/27/2017   INR 0.96 04/15/2016   10/2015   03/2016   PFT's 10 /2016 FEV1 1.58 80% DLCO 16.27 71%  Interpretation: The FEV1 is normal, but the FEV1/FVC ratio and FEF25-75% are reduced. While the TLC, FRC and SVC are within normal limits, the RV is increased. Following administration of bronchodilators, there is a slight response. The reduced diffusing capacity indicates a mild loss of functional alveolar capillary surface. However, the diffusing capacity was not corrected for the patient's hemoglobin. 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 So far on cultures and smears no specific organisms have been identified.  Follow-up CT scan done today reveals Interval development of 11 mm nodule in left lower lobe  And  new 5 mm nodule in left lung apex  Patient's PET scan was reviewed at the multidisciplinary thoracic oncology conference.  With the patient's previous history previous biopsy it was not clear that the lesion currently is malignant.  The recommendation was to obtain a follow-up CT scan to monitor for any short-term changes.  I discussed this with the patient and she is willing to follow-up with a CT scan in mid June.   Grace Isaac MD      [address] [place] [address] Office [phone]   Beeper [phone]  07/12/2017 10:07 PM

## 2017-07-16 MED FILL — SHINGRIX VIAL KIT: 50 | 1 days supply | Qty: 1 | Fill #1

## 2017-08-11 IMAGING — CR DG CHEST 1V PORT
1 series · 1 of 1 positions shown · non-contrast
Comparison: 03/03/2015

CLINICAL DATA: Chest tube placement

EXAM:
PORTABLE CHEST 1 VIEW

[AP]
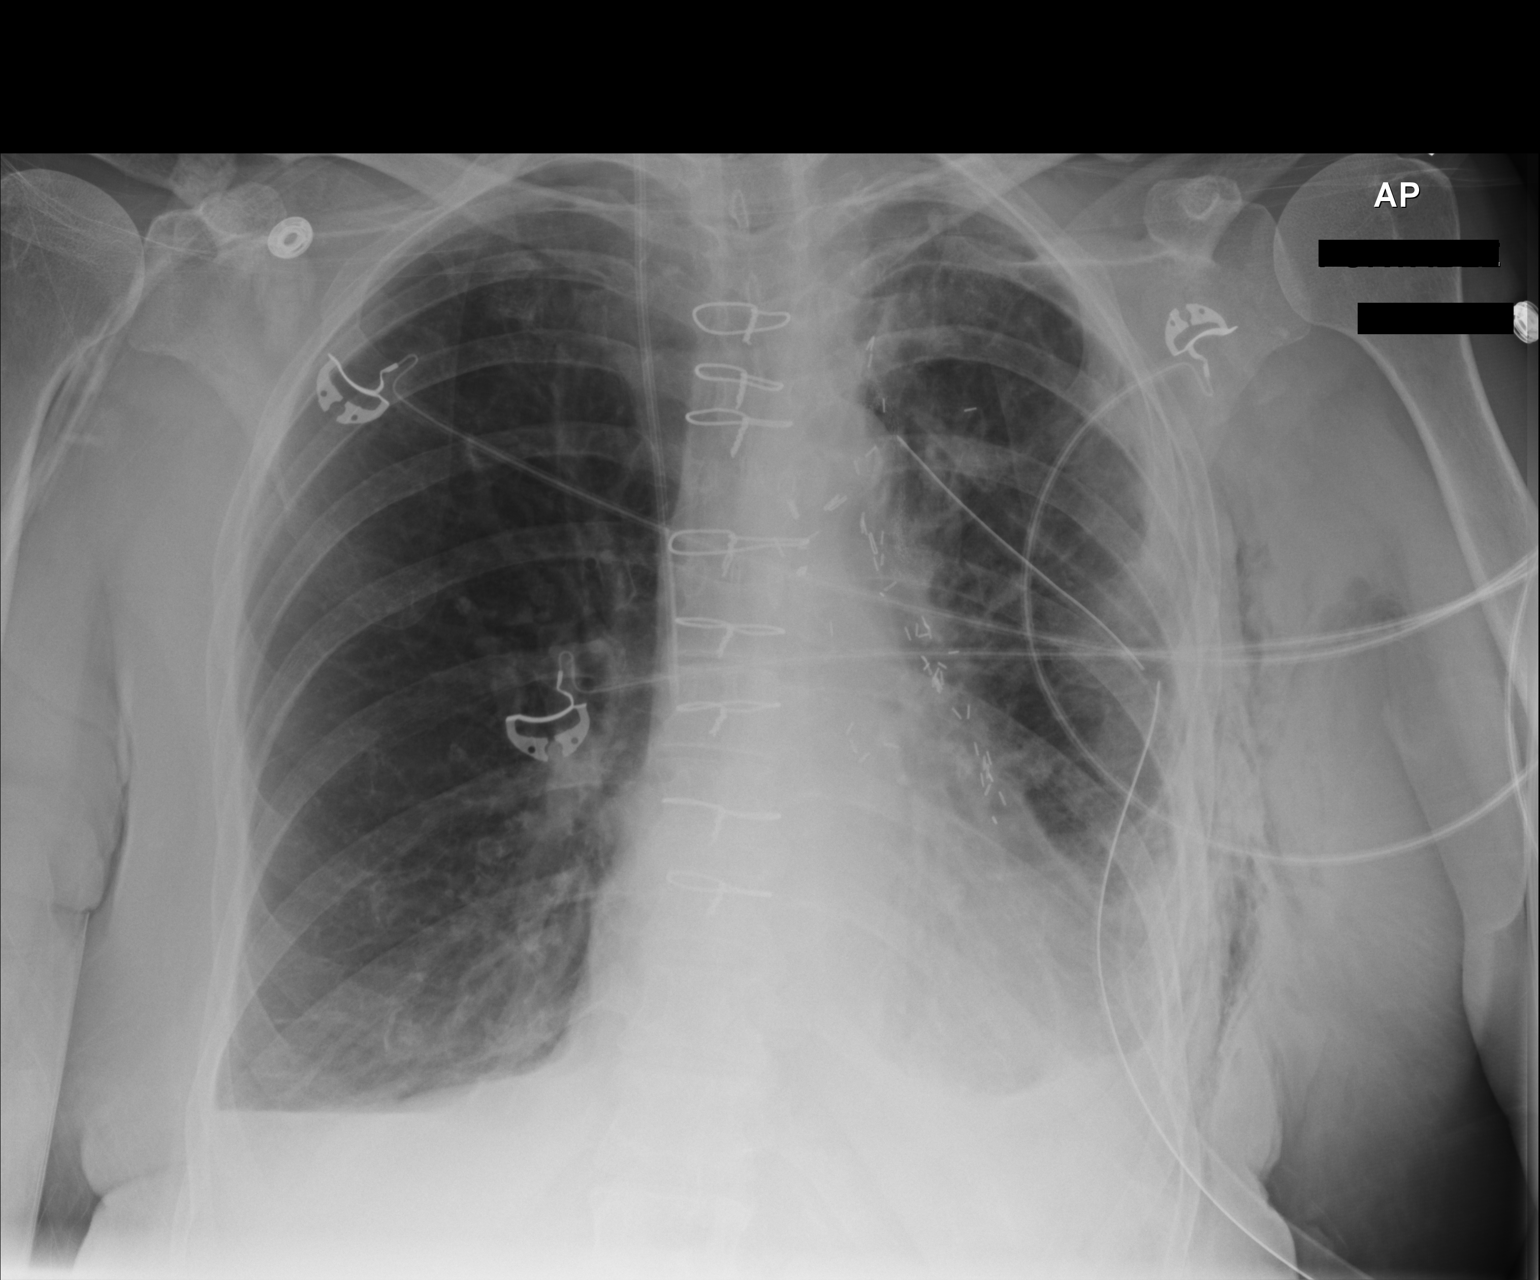

[1 of 1 positions shown; findings below may reference images not displayed]

FINDINGS: There is a right IJ catheter with tip in the projection of the
cavoatrial junction. Left chest tube is in place. The no significant
pneumothorax identified. Left chest wall subcutaneous emphysema
identified. There are small bilateral pleural effusions identified
left greater than right. Atelectasis noted in the left base.
IMPRESSION: 1. Left chest tube in place, no pneumothorax identified.
2. Bilateral pleural effusions.

## 2017-08-14 IMAGING — CR DG CHEST 2V
2 series · 2 of 2 positions shown · non-contrast
Comparison: 03/06/2015

CLINICAL DATA: Evaluate pneumothorax

EXAM:
CHEST  2 VIEW

[chest pa]
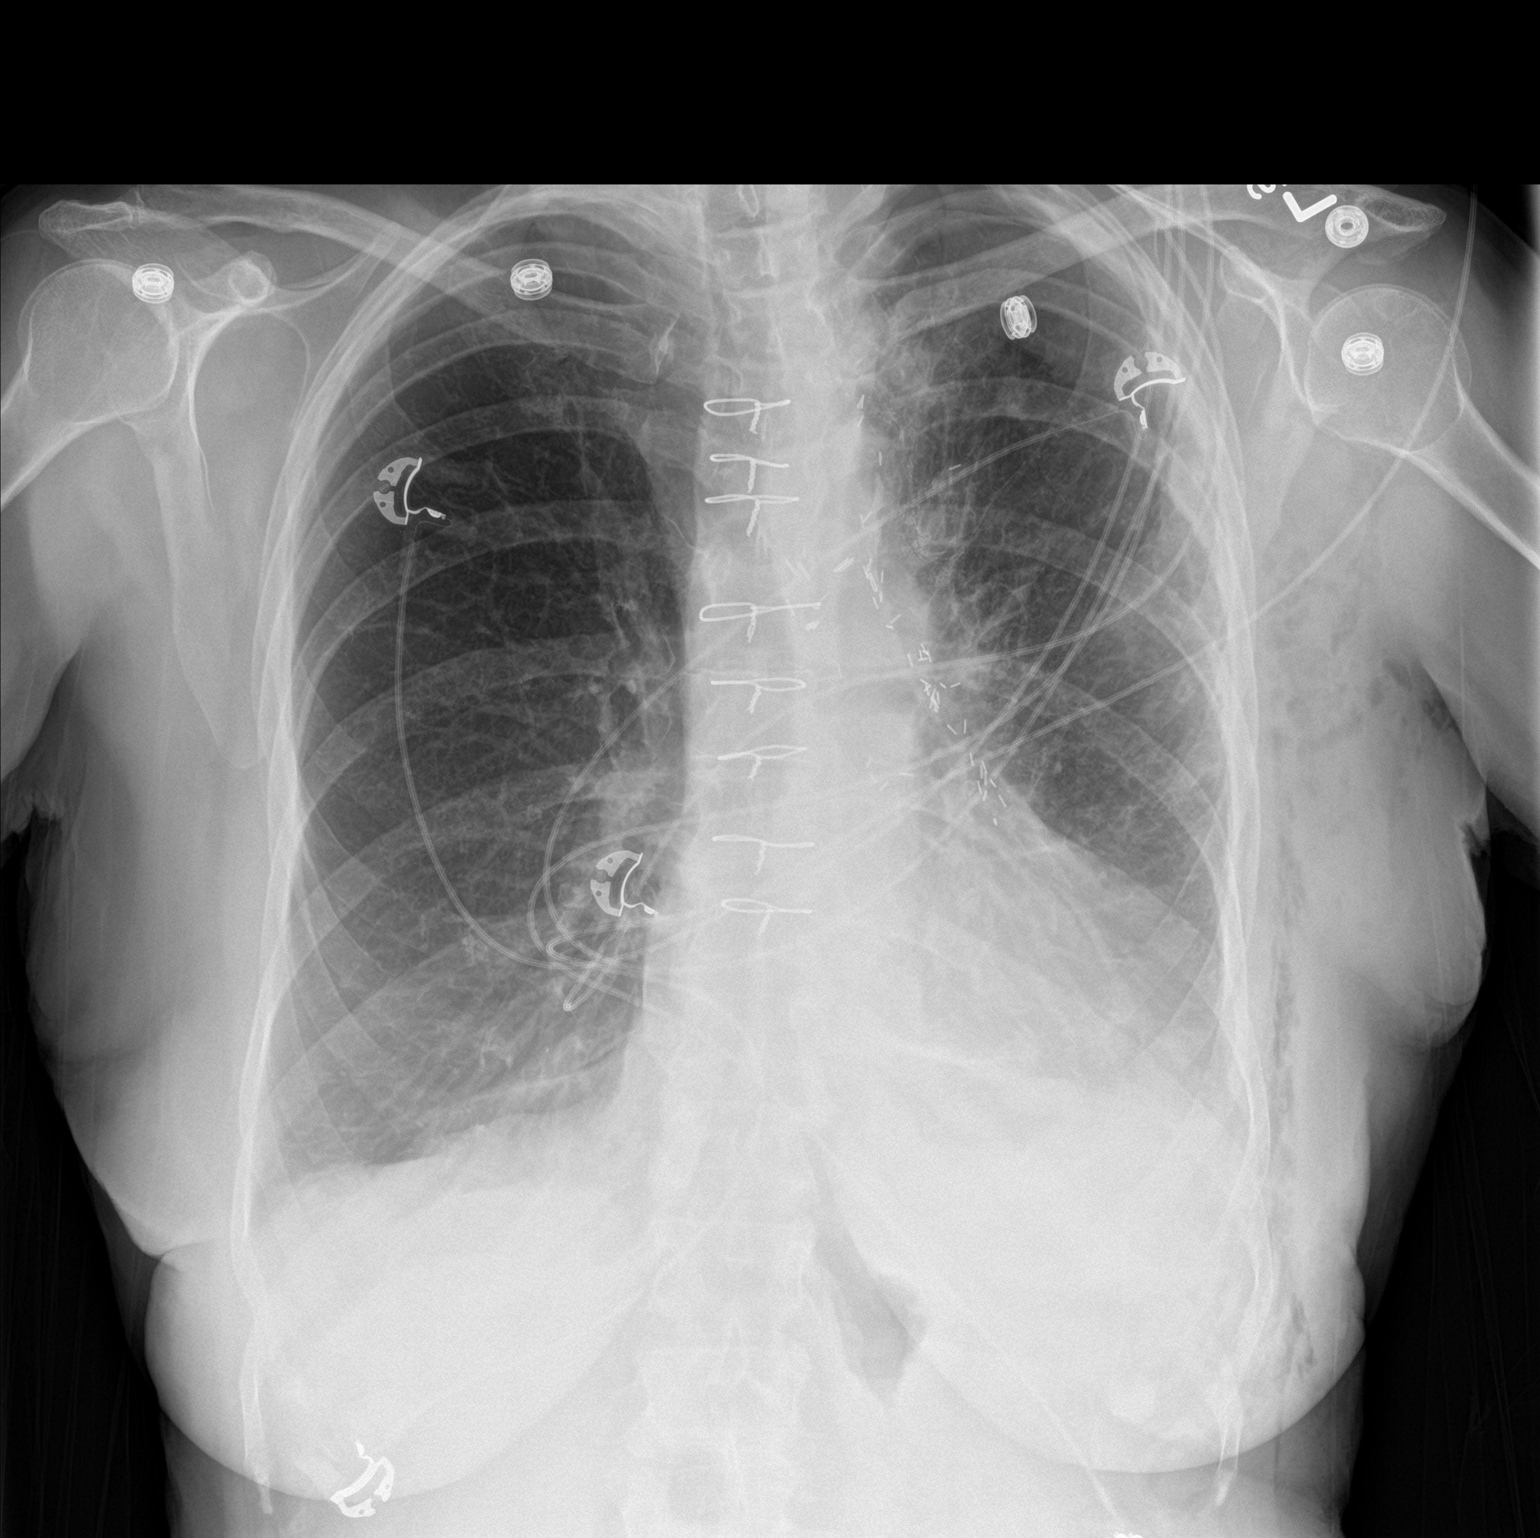

[chest lat]
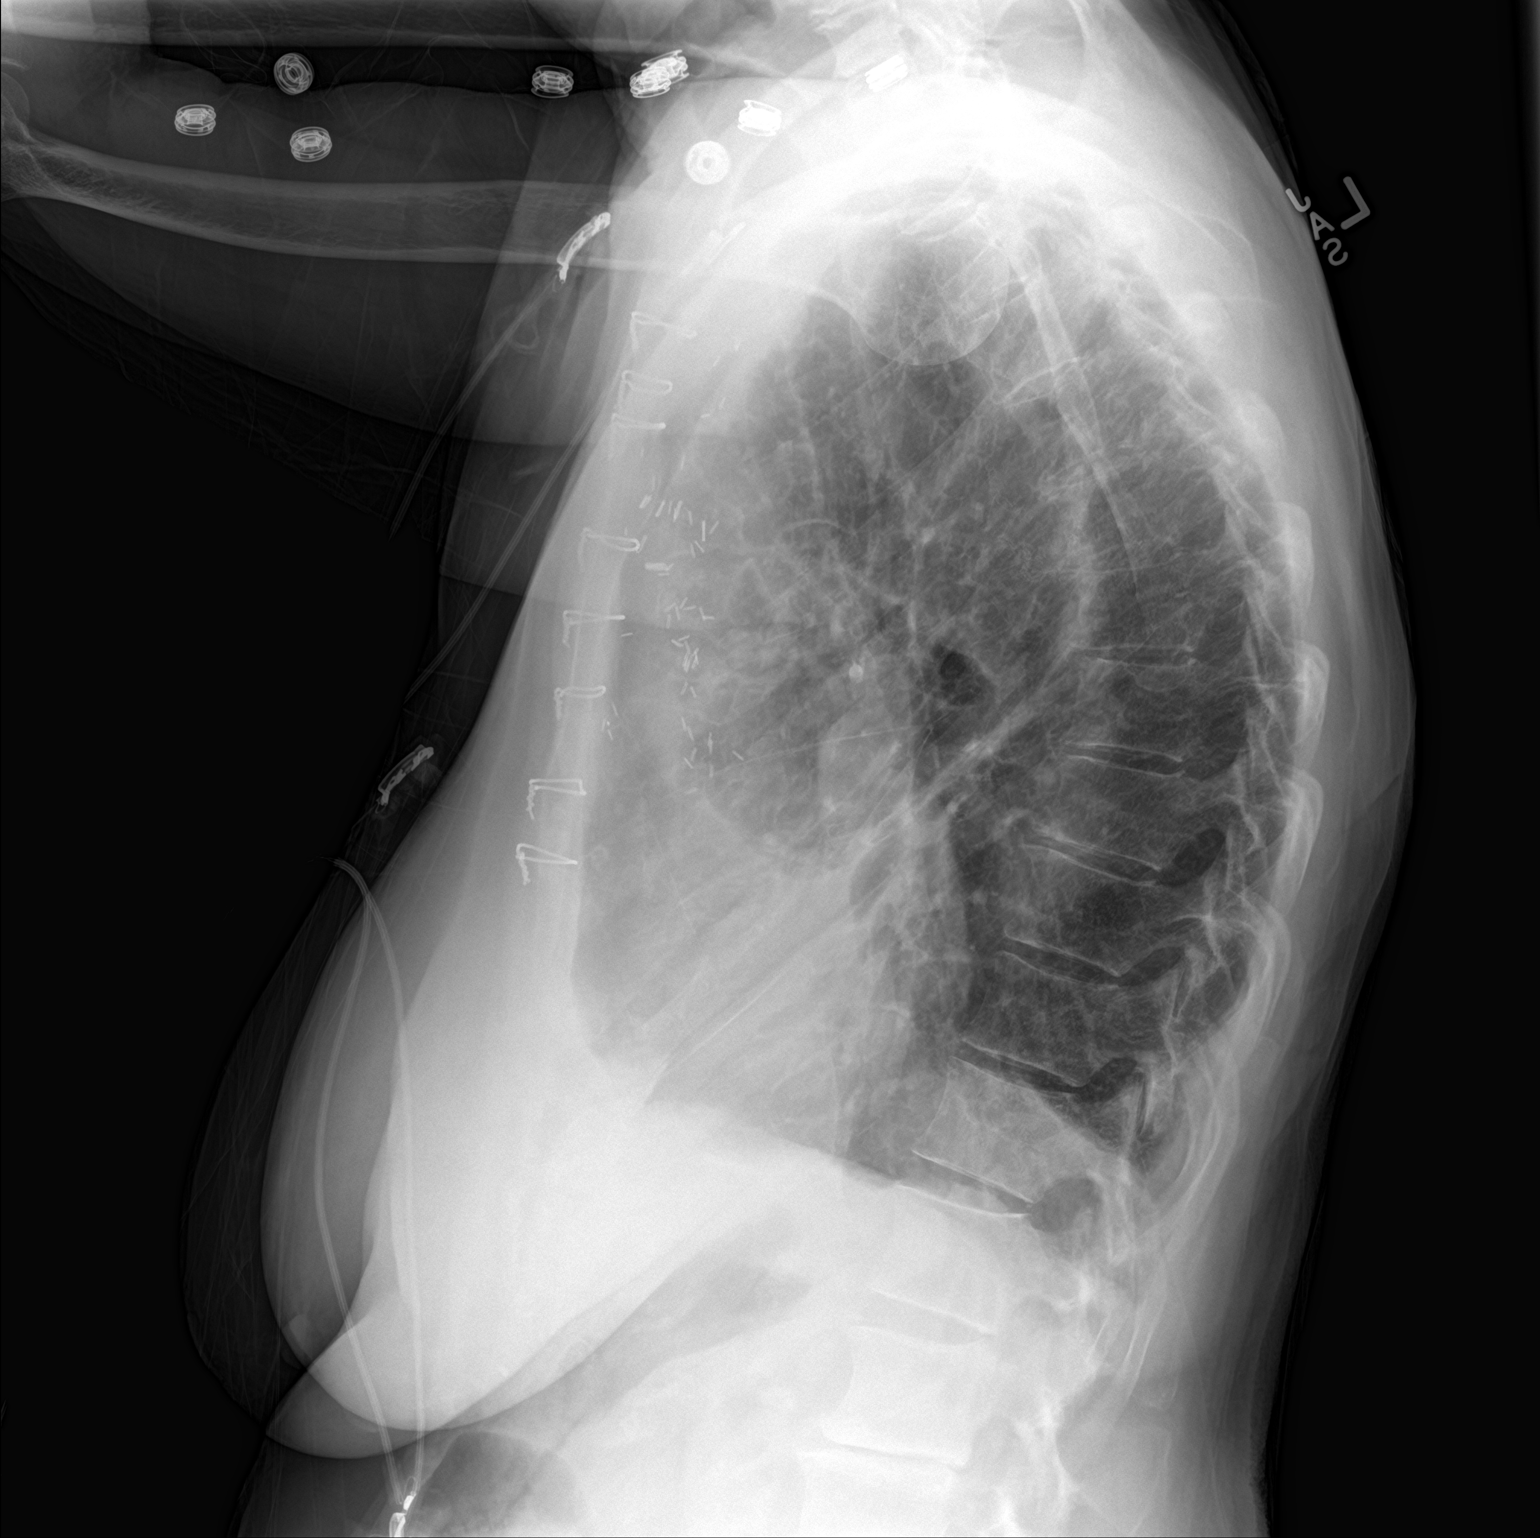

[2 of 2 positions shown; findings below may reference images not displayed]

FINDINGS: Previous median sternotomy and CABG procedure. Heart size is normal.
There are small bilateral pleural effusions and mild edema
consistent with CHF. Left apical pneumothorax is unchanged from
previous exam. Left chest wall subcutaneous emphysema is similar to
previous exam.
IMPRESSION: 1. Mild CHF.
2. No change in small pneumothorax overlying the left apex.

## 2017-08-23 ENCOUNTER — Ambulatory Visit (INDEPENDENT_AMBULATORY_CARE_PROVIDER_SITE_OTHER): Payer: Medicare Other | Admitting: Internal Medicine

## 2017-08-23 ENCOUNTER — Encounter: Payer: Self-pay | Admitting: Internal Medicine

## 2017-08-23 DIAGNOSIS — I10 Essential (primary) hypertension: Secondary | ICD-10-CM | POA: Diagnosis not present

## 2017-08-23 DIAGNOSIS — M069 Rheumatoid arthritis, unspecified: Secondary | ICD-10-CM | POA: Diagnosis not present

## 2017-08-23 DIAGNOSIS — Q8909 Congenital malformations of spleen: Secondary | ICD-10-CM

## 2017-08-23 DIAGNOSIS — E034 Atrophy of thyroid (acquired): Secondary | ICD-10-CM

## 2017-08-23 DIAGNOSIS — D510 Vitamin B12 deficiency anemia due to intrinsic factor deficiency: Secondary | ICD-10-CM | POA: Diagnosis not present

## 2017-08-23 NOTE — Progress Notes (Signed)
Subjective:  Patient ID: [name], female    DOB: [birth year]  Age: 77 y.o. MRN: [id number]  CC: No chief complaint on file.   HPI [name] presents for a well exam  Outpatient Medications Prior to Visit  Medication Sig Dispense Refill  . acetaminophen (TYLENOL) 500 MG tablet Take 500 mg by mouth every 6 (six) hours as needed.    [name] atorvastatin (LIPITOR) 80 MG tablet Take 1 tablet (80 mg total) by mouth daily. 90 tablet 3  . CHLORPHENIRAMINE-ACETAMINOPHEN PO Take 1 tablet by mouth 2 (two) times daily.    . clopidogrel (PLAVIX) 75 MG tablet Take 1 tablet (75 mg total) by mouth daily. 90 tablet 3  . leflunomide (ARAVA) 20 MG tablet Take 20 mg by mouth daily.    [name] levothyroxine (SYNTHROID, LEVOTHROID) 88 MCG tablet Take 1 tablet (88 mcg total) by mouth daily before breakfast. 90 tablet 3  . loperamide (IMODIUM A-D) 2 MG tablet Take 2 mg by mouth 4 (four) times daily as needed for diarrhea or loose stools.    [name] losartan (COZAAR) 25 MG tablet Take 1 tablet (25 mg total) by mouth daily. 90 tablet 3  . metoprolol succinate (TOPROL-XL) 25 MG 24 hr tablet Take 1 tablet (25 mg total) by mouth daily. 90 tablet 3  . psyllium (METAMUCIL) 58.6 % packet Take 1 packet by mouth daily.    [name] triamcinolone (NASACORT ALLERGY 24HR) 55 MCG/ACT AERO nasal inhaler Place 2 sprays into the nose daily.     No facility-administered medications prior to visit.     ROS Review of Systems  Constitutional: Negative for activity change, appetite change, chills, fatigue and unexpected weight change.  HENT: Negative for congestion, mouth sores and sinus pressure.   Eyes: Negative for visual disturbance.  Respiratory: Negative for cough and chest tightness.   Gastrointestinal: Negative for abdominal pain and nausea.  Genitourinary: Negative for difficulty urinating, frequency and vaginal pain.  Musculoskeletal: Positive for arthralgias. Negative for back pain and gait problem.  Skin: Negative for pallor and  rash.  Neurological: Negative for dizziness, tremors, weakness, numbness and headaches.  Psychiatric/Behavioral: Negative for confusion, sleep disturbance and suicidal ideas.    Objective:  BP 126/72 (BP Location: Left Arm, Patient Position: Sitting, Cuff Size: Normal)   Pulse 85   Temp 97.8 F (36.6 C) (Oral)   Ht 5\' 3"  (1.6 m)   Wt 148 lb (67.1 kg)   SpO2 97%   BMI 26.22 kg/m   BP Readings from Last 3 Encounters:  08/23/17 126/72  07/11/17 130/66  06/28/17 (!) 180/90    Wt Readings from Last 3 Encounters:  08/23/17 148 lb (67.1 kg)  07/11/17 148 lb (67.1 kg)  06/28/17 149 lb 9.6 oz (67.9 kg)    Physical Exam  Constitutional: She appears well-developed. No distress.  HENT:  Head: Normocephalic.  Right Ear: External ear normal.  Left Ear: External ear normal.  Nose: Nose normal.  Mouth/Throat: Oropharynx is clear and moist.  Eyes: Pupils are equal, round, and reactive to light. Conjunctivae are normal. Right eye exhibits no discharge. Left eye exhibits no discharge.  Neck: Normal range of motion. Neck supple. No JVD present. No tracheal deviation present. No thyromegaly present.  Cardiovascular: Normal rate, regular rhythm and normal heart sounds.  Pulmonary/Chest: No stridor. No respiratory distress. She has no wheezes.  Abdominal: Soft. Bowel sounds are normal. She exhibits no distension and no mass. There is no tenderness. There is no rebound and no  guarding.  Musculoskeletal: She exhibits tenderness. She exhibits no edema.  Lymphadenopathy:    She has no cervical adenopathy.  Neurological: She displays normal reflexes. No cranial nerve deficit. She exhibits normal muscle tone. Coordination normal.  Skin: No rash noted. No erythema.  Psychiatric: She has a normal mood and affect. Her behavior is normal. Judgment and thought content normal.    Lab Results  Component Value Date   WBC 7.8 02/27/2017   HGB 13.2 02/27/2017   HCT 41.3 02/27/2017   PLT 350.0  02/27/2017   GLUCOSE 92 02/27/2017   CHOL 165 02/27/2017   TRIG 96.0 02/27/2017   HDL 69.80 02/27/2017   LDLDIRECT 162.1 10/28/2009   LDLCALC 76 02/27/2017   ALT 39 (H) 02/27/2017   AST 31 02/27/2017   NA 142 02/27/2017   K 4.5 02/27/2017   CL 105 02/27/2017   CREATININE 1.26 (H) 02/27/2017   BUN 30 (H) 02/27/2017   CO2 27 02/27/2017   TSH 0.06 (L) 02/27/2017   INR 0.96 04/15/2016    Nm Pet Image Restag (ps) Skull Base To Thigh  Result Date: 07/05/2017 CLINICAL DATA:  Subsequent treatment strategy for lung nodule. EXAM: NUCLEAR MEDICINE PET SKULL BASE TO THIGH TECHNIQUE: 7.8 mCi F-18 FDG was injected intravenously. Full-ring PET imaging was performed from the skull base to thigh after the radiotracer. CT data was obtained and used for attenuation correction and anatomic localization. Fasting blood glucose: 100 mg/dl Mediastinal blood pool activity: SUV max 3.2 COMPARISON:  CT chest 06/28/2017, 06/08/2016, 04/15/2016, PET 02/10/2015 and CT chest 01/29/2015. FINDINGS: NECK: No hypermetabolic lymph nodes in the neck. Incidental CT findings: None. CHEST: Nodular scarring in the medial left upper lobe does not show abnormal hypermetabolism. A 10 mm round nodule in the inferior left lower lobe has an SUV max 3.4, is stable in size from 06/28/2017 but new from CT chest 06/08/2016. No hypermetabolic mediastinal, hilar or axillary lymph nodes. Incidental CT findings: Atherosclerotic calcification of the arterial vasculature. Heart is enlarged. No pericardial or pleural effusion. 6 mm apical left upper lobe nodule (series 8, image 11), too small for PET resolution. ABDOMEN/PELVIS: No abnormal hypermetabolism in the liver, adrenal glands, spleen or pancreas. No hypermetabolic lymph nodes. Incidental CT findings: Low-attenuation lesions in the kidneys are difficult to further characterize without post-contrast imaging. Splenectomy. SKELETON: No abnormal osseous hypermetabolism. Incidental CT findings:  Degenerative changes in the spine. IMPRESSION: 1. Mildly hypermetabolic left lower lobe nodule, stable in size from 06/28/2017 but new from 06/08/2016. Findings are indeterminate with both malignant and infectious etiologies considered, given the short interval from 06/28/2017. Consider follow-up CT chest in 3-4 weeks in further initial evaluation. 2. 6 mm apical left upper lobe nodule is too small for PET resolution. 3.  Aortic atherosclerosis (ICD10-170.0). Electronically Signed   By: Lorin Picket M.D.   On: 07/05/2017 09:09    Assessment & Plan:   There are no diagnoses linked to this encounter. I am having Avian E. Dershem maintain her psyllium, loperamide, leflunomide, acetaminophen, atorvastatin, clopidogrel, levothyroxine, losartan, metoprolol succinate, triamcinolone, and CHLORPHENIRAMINE-ACETAMINOPHEN PO.  No orders of the defined types were placed in this encounter.    Follow-up: No follow-ups on file.  [name], MD

## 2017-08-23 NOTE — Assessment & Plan Note (Signed)
Vaccines discussed 

## 2017-08-23 NOTE — Assessment & Plan Note (Signed)
Losartan, Metoprolol

## 2017-08-23 NOTE — Assessment & Plan Note (Signed)
On Arava 

## 2017-08-23 NOTE — Assessment & Plan Note (Signed)
Labs

## 2017-08-24 ENCOUNTER — Other Ambulatory Visit (INDEPENDENT_AMBULATORY_CARE_PROVIDER_SITE_OTHER): Payer: Medicare Other

## 2017-08-24 DIAGNOSIS — M069 Rheumatoid arthritis, unspecified: Secondary | ICD-10-CM

## 2017-08-24 DIAGNOSIS — D510 Vitamin B12 deficiency anemia due to intrinsic factor deficiency: Secondary | ICD-10-CM | POA: Diagnosis not present

## 2017-08-24 DIAGNOSIS — Q8909 Congenital malformations of spleen: Secondary | ICD-10-CM | POA: Diagnosis not present

## 2017-08-24 LAB — BASIC METABOLIC PANEL WITH GFR
BUN: 30 mg/dL — ABNORMAL HIGH (ref 6–23)
CO2: 27 meq/L (ref 19–32)
Calcium: 9.8 mg/dL (ref 8.4–10.5)
Chloride: 103 meq/L (ref 96–112)
Creatinine, Ser: 1.35 mg/dL — ABNORMAL HIGH (ref 0.40–1.20)
GFR: 40.19 mL/min — ABNORMAL LOW
Glucose, Bld: 90 mg/dL (ref 70–99)
Potassium: 4.8 meq/L (ref 3.5–5.1)
Sodium: 141 meq/L (ref 135–145)

## 2017-08-24 LAB — URINALYSIS
BILIRUBIN URINE: NEGATIVE
Hgb urine dipstick: NEGATIVE
KETONES UR: NEGATIVE
LEUKOCYTES UA: NEGATIVE
Nitrite: NEGATIVE
Specific Gravity, Urine: <=1.005 — AB (ref 1.000–1.030)
Total Protein, Urine: NEGATIVE
UROBILINOGEN UA: 0.2 (ref 0.0–1.0)
Urine Glucose: NEGATIVE
pH: 6 (ref 5.0–8.0)

## 2017-08-24 LAB — HEPATIC FUNCTION PANEL
ALT: 23 U/L (ref 0–35)
AST: 22 U/L (ref 0–37)
Albumin: 4.1 g/dL (ref 3.5–5.2)
Alkaline Phosphatase: 105 U/L (ref 39–117)
BILIRUBIN DIRECT: 0.1 mg/dL (ref 0.0–0.3)
BILIRUBIN TOTAL: 0.6 mg/dL (ref 0.2–1.2)
Total Protein: 6.8 g/dL (ref 6.0–8.3)

## 2017-08-24 LAB — CBC WITH DIFFERENTIAL/PLATELET
BASOS PCT: 0.6 % (ref 0.0–3.0)
Basophils Absolute: 0.1 10*3/uL (ref 0.0–0.1)
Eosinophils Absolute: 0.4 10*3/uL (ref 0.0–0.7)
Eosinophils Relative: 4.7 % (ref 0.0–5.0)
HEMATOCRIT: 42 % (ref 36.0–46.0)
Hemoglobin: 13.9 g/dL (ref 12.0–15.0)
LYMPHS PCT: 29.9 % (ref 12.0–46.0)
Lymphs Abs: 2.4 10*3/uL (ref 0.7–4.0)
MCHC: 33.1 g/dL (ref 30.0–36.0)
MCV: 94.3 fl (ref 78.0–100.0)
MONOS PCT: 12.4 % — AB (ref 3.0–12.0)
Monocytes Absolute: 1 10*3/uL (ref 0.1–1.0)
NEUTROS ABS: 4.2 10*3/uL (ref 1.4–7.7)
Neutrophils Relative %: 52.4 % (ref 43.0–77.0)
PLATELETS: 333 10*3/uL (ref 150.0–400.0)
RBC: 4.46 Mil/uL (ref 3.87–5.11)
RDW: 14.9 % (ref 11.5–15.5)
WBC: 8.1 10*3/uL (ref 4.0–10.5)

## 2017-08-24 LAB — TSH: TSH: 4.54 u[IU]/mL — AB (ref 0.35–4.50)

## 2017-08-30 ENCOUNTER — Other Ambulatory Visit: Payer: Self-pay | Admitting: Obstetrics

## 2017-08-30 DIAGNOSIS — M8580 Other specified disorders of bone density and structure, unspecified site: Secondary | ICD-10-CM

## 2017-10-04 ENCOUNTER — Ambulatory Visit: Payer: Medicare Other | Admitting: Cardiothoracic Surgery

## 2017-10-04 ENCOUNTER — Other Ambulatory Visit: Payer: Self-pay | Admitting: *Deleted

## 2017-10-04 ENCOUNTER — Ambulatory Visit
Admission: RE | Admit: 2017-10-04 | Discharge: 2017-10-04 | Disposition: A | Discharge status: Home / Self Care | Payer: Medicare Other | Source: Ambulatory Visit | Attending: Cardiothoracic Surgery | Admitting: Cardiothoracic Surgery

## 2017-10-04 VITALS — BP 95/71 | HR 92 | Resp 20 | Ht 63.0 in | Wt 148.0 lb

## 2017-10-04 DIAGNOSIS — R918 Other nonspecific abnormal finding of lung field: Secondary | ICD-10-CM

## 2017-10-04 DIAGNOSIS — R911 Solitary pulmonary nodule: Secondary | ICD-10-CM

## 2017-10-04 DIAGNOSIS — J8410 Pulmonary fibrosis, unspecified: Secondary | ICD-10-CM

## 2017-10-04 NOTE — Progress Notes (Signed)
[address]       [name],[place] [address]             [phone]      [name] Medical Record #[id number] Date of Birth: 17-Mar-1941  Referring: Sherry Rockers, MD Primary Care: Sherry Anger, MD  Chief Complaint:   POST OP FOLLOW UP 03/02/2015 OPERATIVE REPORT PREOPERATIVE DIAGNOSIS: Left upper lobe lung mass POSTOPERATIVE DIAGNOSIS: Left upper lobe lung mass. 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 PROCEDURE PERFORMED: Video bronchoscopy, left video-assisted thoracoscopy, minithoracotomy, wedge resection of left upper lobe. SURGEON: [name], M.D Previous left VATS lung resection Lung, wedge biopsy/resection, left upper lobe - NECROTIZING GRANULOMA, 1.7 CM. - NO TUMOR SEEN. - SEE COMMENT. Microscopic Comment Sections demonstrate a 1.7 cm necrotizing granuloma. There is associated reactive pneumocyte hyperplasia with reactive atypia but no true tumor is identified. An AFB stain is performed on a representative section which is negative for acid fast bacilli. A GMS and PAS stain are also performed on a representative section which are both negative for fungal organisms. Although specimen stains for microorganisms are negative, stains are not as sensitive as other microbiologic techniques including culture. Please correlate with culture results. (RAH:gt, 03/04/15) Sherry Niece MD Pathologist, Electronic Signature (Case signed 03/04/2015  History of Present Illness:     Patient was  admitted to [facility] in December 2017 with respiratory problems  , a CT scan of the chest was done at that time the report notes recurrent malignancy and old stable line, however the patient has no previous history of lung cancer nor lobectomy.  Repeat ct of chest done with enlarging are left upper lobe pleural based, not at area of previous wedge resection (patient did not have lobectomy as some notes indicate, this was  interpreted as recurrent malignancy however the patient previous surgery was not related to malignancy, she has no previous history of lung cancer.  Patient comes in today with a follow-up CT scan, to evaluate a approximately 1 cm nodule in the left lower lobe at the diaphragm, and multiple other smaller nodes in the left and right lung.   Since last seen she has had no new symptoms no cough fever chills.     She did smoke for 5-7 years but quit at age 77. With her limited smoking history she would not be eligible for lung cancer screening CT program.  Past Medical History:  Diagnosis Date  . Abdominal aortic aneurysm (HCC)    REPAIRED IN 1996 BY [name]  AND HAS RECENTLY BEEN FOLLOWED BY [name]  . 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 Adenomatous colon polyp    tubular  . Anemia   . CAD in native artery 1996, 2002, 2005    Status post CABG x1 with LIMA-LAD for ostial LAD 90% stenosis --> down to 50% in 2002 and 30% in 2005.;  Atretic LIMA; Myoview 06/2010: Fixed anteroseptal, apical and inferoapical defect with moderate size. Most likely scar. Mild subendocardial ischemia. EF 71% LOW RISK.   [name] Cervical disc disease   . Chronic kidney disease   . COPD (chronic obstructive pulmonary disease) ([place])   . Diverticulosis   . Hyperlipidemia   . Hypertension   . Hypothyroidism (acquired)    hypo  . Myocardial infarction [facility]) Aug. 2016   TIA  . 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 Charlestine Night  . Rheumatoid arthritis ([place]) 2011   Dr.Truslow; fracture knees, hands and wrists -   . S/P CABG x 1 1996   CABG--LIMA-LAD for ostial LAD (not felt to be PCI amenable). EF NORMAL then; LIMA now atretic  . Shortness of breath dyspnea    with exertion  . Stroke [facility]) 11-2014   TIA   . Urinary frequency      Social History   Tobacco Use  Smoking Status Former Smoker  . Last attempt to quit: 12/04/1958  . Years  since quitting: 39.8  Smokeless Tobacco Never Used    Social History   Substance and Sexual Activity  Alcohol Use No     Allergies  Allergen Reactions  . Aspirin Other (See Comments)    Hurts stomach  . Codeine Nausea And Vomiting  . Nitroglycerin Other (See Comments)    Heart rate drops    Current Outpatient Medications  Medication Sig Dispense Refill  . acetaminophen (TYLENOL) 500 MG tablet Take 500 mg by mouth every 6 (six) hours as needed.    [name] atorvastatin (LIPITOR) 80 MG tablet Take 1 tablet (80 mg total) by mouth daily. 90 tablet 3  . CHLORPHENIRAMINE-ACETAMINOPHEN PO Take 1 tablet by mouth 2 (two) times daily.    . clopidogrel (PLAVIX) 75 MG tablet Take 1 tablet (75 mg total) by mouth daily. 90 tablet 3  . leflunomide (ARAVA) 20 MG tablet Take 20 mg by mouth daily.    [name] levothyroxine (SYNTHROID, LEVOTHROID) 88 MCG tablet Take 1 tablet (88 mcg total) by mouth daily before breakfast. 90 tablet 3  . loperamide (IMODIUM A-D) 2 MG tablet Take 2 mg by mouth 4 (four) times daily as needed for diarrhea or loose stools.    [name] losartan (COZAAR) 25 MG tablet Take 1 tablet (25 mg total) by mouth daily. 90 tablet 3  . metoprolol succinate (TOPROL-XL) 25 MG 24 hr tablet Take 1 tablet (25 mg total) by mouth daily. 90 tablet 3  . psyllium (METAMUCIL) 58.6 % packet Take 1 packet by mouth daily.    [name] triamcinolone (NASACORT ALLERGY 24HR) 55 MCG/ACT AERO nasal inhaler Place 2 sprays into the nose daily.     No current facility-administered medications for this visit.     Review of Systems  Constitutional: Negative.   HENT: Negative.   Eyes: Negative.   Respiratory: Negative.   Cardiovascular: Negative.  Negative for chest pain, palpitations, orthopnea, claudication, leg swelling and PND.  Gastrointestinal: Negative.   Genitourinary: Negative.   Musculoskeletal: Negative.   Skin: Negative.   Neurological: Negative.   Endo/Heme/Allergies: Bruises/bleeds easily.    Psychiatric/Behavioral: Negative.      Physical Exam: BP 95/71   Pulse 92   Resp 20   Ht 5\' 3"  (1.6 m)   Wt 148 lb (67.1 kg)   SpO2 98% Comment: RA  BMI 26.22 kg/m  General appearance: alert, cooperative, appears stated age and no distress Head: Normocephalic, without obvious abnormality, atraumatic Neck: no adenopathy, no carotid bruit, no JVD, supple, symmetrical, trachea midline and thyroid not enlarged, symmetric, no tenderness/mass/nodules Lymph nodes: Cervical, supraclavicular, and axillary nodes normal. Resp: clear to auscultation bilaterally Cardio: regular rate and rhythm, S1, S2 normal, no murmur, click, rub or gallop GI: soft, non-tender; bowel sounds normal; no masses,  no organomegaly Extremities: extremities normal, atraumatic, no cyanosis or edema and Homans sign is negative, no sign of DVT Neurologic: Grossly normal   Diagnostic Studies & Laboratory data:  Recent Radiology Findings:  Ct Super D Chest Wo Contrast  Result Date: 10/04/2017 CLINICAL DATA:  Lung nodule. EXAM: CT CHEST WITHOUT CONTRAST TECHNIQUE: Multidetector CT imaging of the chest was performed using thin slice collimation for electromagnetic bronchoscopy planning purposes, without intravenous contrast. COMPARISON:  CT chest 06/28/2017. FINDINGS: Cardiovascular: The heart size appears within normal limits. No pericardial effusion. Previous median sternotomy and CABG procedure. Aortic atherosclerosis noted. Mediastinum/Nodes: Normal appearance of the thyroid gland. The trachea appears patent and is midline. Normal appearance of the esophagus. No enlarged mediastinal or hilar lymph nodes. No axillary or supraclavicular adenopathy. Lungs/Pleura: Postoperative changes involving the left upper lobe are again noted. The index nodule within the left upper lobe measures 7 mm, image 18/8. This is compared with 4 mm previously. The index nodule within the left lower lobe measures 1.2 x 1.2 cm, image 117/8.  Previously 1.1 x 0.9 cm. 4 mm right lower lobe lung nodule is noted, image 125/8. Previously this measured the same. Within the medial right lower lobe there is an 8 mm subpleural nodule, image 72/2. Stable from comparison exam. In the right upper lobe there is a small nodule measuring 5 mm, image 43/8. Stable from previous exam. Upper Abdomen: Lobular appearing left kidney noted. The adrenal glands are unremarkable. No acute abnormality noted. Musculoskeletal: Mild degenerative disc disease noted throughout the thoracic spine. No suspicious bone lesions. IMPRESSION: 1. There has been increase in size of left lower lobe lung nodule compared with 06/28/2017. Small left upper lobe lung nodule is also increased in size. 2. Right lower lobe and right upper lobe lung nodules are stable in the interval. 3.  Aortic Atherosclerosis (ICD10-I70.0). Electronically Signed   By: Kerby Moors M.D.   On: 10/04/2017 10:57   I have independently reviewed the above radiology studies  and reviewed the findings with the patient.    CLINICAL DATA:  Subsequent treatment strategy for lung nodule.  EXAM: NUCLEAR MEDICINE PET SKULL BASE TO THIGH  TECHNIQUE: 7.8 mCi F-18 FDG was injected intravenously. Full-ring PET imaging was performed from the skull base to thigh after the radiotracer. CT data was obtained and used for attenuation correction and anatomic localization.  Fasting blood glucose: 100 mg/dl  Mediastinal blood pool activity: SUV max 3.2  COMPARISON:  CT chest 06/28/2017, 06/08/2016, 04/15/2016, PET 02/10/2015 and CT chest 01/29/2015.  FINDINGS: NECK: No hypermetabolic lymph nodes in the neck.  Incidental CT findings: None.  CHEST: Nodular scarring in the medial left upper lobe does not show abnormal hypermetabolism. A 10 mm round nodule in the inferior left lower lobe has an SUV max 3.4, is stable in size from 06/28/2017 but new from CT chest 06/08/2016. No hypermetabolic mediastinal,  hilar or axillary lymph nodes.  Incidental CT findings: Atherosclerotic calcification of the arterial vasculature. Heart is enlarged. No pericardial or pleural effusion. 6 mm apical left upper lobe nodule (series 8, image 11), too small for PET resolution.  ABDOMEN/PELVIS: No abnormal hypermetabolism in the liver, adrenal glands, spleen or pancreas. No hypermetabolic lymph nodes.  Incidental CT findings: Low-attenuation lesions in the kidneys are difficult to further characterize without post-contrast imaging. Splenectomy.  SKELETON: No abnormal osseous hypermetabolism.  Incidental CT findings: Degenerative changes in the spine.  IMPRESSION: 1. Mildly hypermetabolic left lower lobe nodule, stable in size from 06/28/2017 but new from 06/08/2016. Findings are indeterminate with both malignant and infectious etiologies considered, given the short interval from 06/28/2017. Consider follow-up CT chest in 3-4 weeks in further initial evaluation. 2. 6  mm apical left upper lobe nodule is too small for PET resolution. 3.  Aortic atherosclerosis (ICD10-170.0).   Electronically Signed   By: Lorin Picket M.D.   On: 07/05/2017 09:09   I have independently reviewed the above radiology studies  and reviewed the findings with the patient.   Dg Chest 2 View  Result Date: 04/17/2016 CLINICAL DATA:  Productive cough for the past month. EXAM: CHEST  2 VIEW COMPARISON:  04/15/2016; 07/22/2015; chest CT - 04/15/2016 FINDINGS: Grossly unchanged cardiac silhouette and mediastinal contours post median sternotomy. Atherosclerotic plaque within the thoracic aorta. The lungs remain hyperexpanded with flattening of the diaphragms and thinning of the biapical pulmonary parenchyma. Known ill-defined soft tissue about the left upper lobe surgical resection site is not well demonstrated on the present examination. No focal airspace opacities. No pleural effusion or pneumothorax. No evidence of  edema. IMPRESSION: 1. Hyperexpanded lungs without acute cardiopulmonary disease. Emphysema. (ICD10-J43.9) 2. Previously identified soft tissue about the left upper lobe surgical resection site is suboptimally evaluated on the present examination. Correlation with report from chest CT performed 04/15/2016 is recommended. Electronically Signed   By: Sandi Mariscal M.D.   On: 04/17/2016 15:42     Ct Angio Chest Pe W And/or Wo Contrast  Result Date: 04/15/2016 CLINICAL DATA:  Elevated D-dimer.  Productive cough congestion. EXAM: CT ANGIOGRAPHY CHEST WITH CONTRAST TECHNIQUE: Multidetector CT imaging of the chest was performed using the standard protocol during bolus administration of intravenous contrast. Multiplanar CT image reconstructions and MIPs were obtained to evaluate the vascular anatomy. CONTRAST:  80 cc Isovue 370 IV COMPARISON:  11/23/2015 FINDINGS: Cardiovascular: No filling defects in the pulmonary arteries to suggest pulmonary emboli. Heart is normal size. Prior CABG. Aorta is tortuous with scattered calcifications, non aneurysmal. Mediastinum/Nodes: Mildly enlarged mediastinal lymph nodes. Subcarinal lymph node has a short axis diameter of 13 mm. Other small scattered paratracheal and AP window lymph nodes. No hilar or axillary adenopathy. Lungs/Pleura: Increasing density noted in the medial left upper lobe in the area of prior postoperative change. This soft tissue measures 2.2 x 1.7 cm on image 15. Given the location in the postoperative bed, this is concerning for tumor recurrence. Nodule in the left lower lobe on image 38 measures 8 mm on image 38, stable. Scarring noted in the lung bases. No pleural effusions. Upper Abdomen: Imaging into the upper abdomen shows no acute findings. Musculoskeletal: Chest wall soft tissues are unremarkable. No acute bony abnormality or focal bone lesion. Review of the MIP images confirms the above findings. IMPRESSION: Enlarging abnormal soft tissue in the left  upper lobe at the prior surgical site concerning for recurrent tumor. This measures up to 2.2 cm. This could be further evaluated with PET CT. Stable left lower lobe pulmonary nodule. Borderline and mildly enlarged mediastinal lymph nodes. Electronically Signed   By: Rolm Baptise M.D.   On: 04/15/2016 15:26    Ct Chest Wo Contrast  Result Date: 11/23/2015 CLINICAL DATA:  Followup left lung nodule. Previous wedge resection revealed necrotizing granuloma. EXAM: CT CHEST WITHOUT CONTRAST TECHNIQUE: Multidetector CT imaging of the chest was performed following the standard protocol without IV contrast. COMPARISON:  01/29/2015 FINDINGS: Cardiovascular: Heart size within normal limits. Aortic atherosclerosis noted. Previous CABG. Mediastinum/Lymph Nodes: No masses or pathologically enlarged lymph nodes identified on this un-enhanced exam. Lungs/Pleura: Postop changes seen in the medial left upper lobe from previous wedge resection. 8 mm mean diameter perifissural nodule nodule is seen in the left midlung on image  66/4. This is new since previous study, but likely represents an intrapulmonary lymph node. Mild centrilobular emphysema noted. Bibasilar scarring and tiny sub-cm posterior right lower lobe pulmonary nodule remains stable. No evidence of acute infiltrate or pleural effusion. Upper abdomen: No acute findings. Musculoskeletal: No chest wall mass or suspicious bone lesions identified. IMPRESSION: Postop changes in left upper lobe. 8 mm mean diameter perifissural nodule in the left midlung is new, but likely represents an intrapulmonary lymph node. Non-contrast chest CT at 6-12 months is recommended. If the nodule is stable at time of repeat CT, then future CT at 18-24 months (from today's scan) is considered optional for low-risk patients, but is recommended for high-risk patients. This recommendation follows the consensus statement: Guidelines for Management of Incidental Pulmonary Nodules Detected on CT  Images:From the Fleischner Society 2017; published online before print (10.1148/radiol.[id number]). Aortic atherosclerosis and emphysema. Electronically Signed   By: Earle Gell M.D.   On: 11/23/2015 15:46    Recent Lab Findings: Lab Results  Component Value Date   WBC 8.1 08/24/2017   HGB 13.9 08/24/2017   HCT 42.0 08/24/2017   PLT 333.0 08/24/2017   GLUCOSE 90 08/24/2017   CHOL 165 02/27/2017   TRIG 96.0 02/27/2017   HDL 69.80 02/27/2017   LDLDIRECT 162.1 10/28/2009   LDLCALC 76 02/27/2017   ALT 23 08/24/2017   AST 22 08/24/2017   NA 141 08/24/2017   K 4.8 08/24/2017   CL 103 08/24/2017   CREATININE 1.35 (H) 08/24/2017   BUN 30 (H) 08/24/2017   CO2 27 08/24/2017   TSH 4.54 (H) 08/24/2017   INR 0.96 04/15/2016   10/2015   03/2016   PFT's 10 /2016 FEV1 1.58 80% DLCO 16.27 71%  Interpretation: The FEV1 is normal, but the FEV1/FVC ratio and FEF25-75% are reduced. While the TLC, FRC and SVC are within normal limits, the RV is increased. Following administration of bronchodilators, there is a slight response. The reduced diffusing capacity indicates a mild loss of functional alveolar capillary surface. However, the diffusing capacity was not corrected for the patient's hemoglobin. 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   Follow-up CT scan follow-up scans have shown Interval development of 11 mm nodule in left lower lobe  And  new 5 mm nodule in left lung apex  Scan today shows slight enlargement of the nodules over a short interval 3 months, the scan was done as a super D format.  I have reviewed the scans with patient with her previous history this may be inflammatory, however malignancy cannot be ruled out.  Since there has  been enlargement of recommend to the patient that we have make an attempt at obtaining a tissue diagnosis especially of the left lower lobe nodule with navigation bronchoscopy.  The risk and options including anesthetic complications and/or pneumothorax been discussed with the patient in detail.  She is on Plavix and will discontinue her Plavix today, will plan for bronchoscopy with navigation bronchoscopy, enb on Monday June 10.   Grace Isaac MD      [address] [place], [address] Office [phone]   Beeper [phone]  10/04/2017 11:17 AM

## 2017-10-05 ENCOUNTER — Other Ambulatory Visit (HOSPITAL_COMMUNITY): Payer: Medicare Other

## 2017-10-05 NOTE — Progress Notes (Signed)
I was unable to reach patient by phone.  I left  A message on voice mail.  I instructed the patient to arrive at [place] entrance at [place]  , nothing to eat or drink after midnight.   I instructed the patient to take the following medications in the am with just enough water to get them down: Levothyroxine, Metoprolol, if needed: Tylenol. I asked patient to not wear any lotions, powders, cologne,deodorant,  jewelry, piercing, make-up or nail polish.  I asked the patient to call [phone], in the am if there were any questions or problems. I informed patient that she needs a driver and someone to stay with her for the 1st 24 hours

## 2017-10-08 ENCOUNTER — Encounter (HOSPITAL_COMMUNITY)
Admission: RE | Disposition: A | Discharge status: Home / Self Care | Payer: Self-pay | Source: Ambulatory Visit | Attending: Cardiothoracic Surgery

## 2017-10-08 ENCOUNTER — Ambulatory Visit (HOSPITAL_COMMUNITY): Payer: Medicare Other

## 2017-10-08 ENCOUNTER — Ambulatory Visit (HOSPITAL_COMMUNITY): Payer: Medicare Other | Admitting: Anesthesiology

## 2017-10-08 ENCOUNTER — Ambulatory Visit (HOSPITAL_COMMUNITY)
Admission: RE | Admit: 2017-10-08 | Discharge: 2017-10-08 | Disposition: A | Discharge status: Home / Self Care | Payer: Medicare Other | Source: Ambulatory Visit | Attending: Cardiothoracic Surgery | Admitting: Cardiothoracic Surgery

## 2017-10-08 ENCOUNTER — Encounter (HOSPITAL_COMMUNITY): Payer: Self-pay | Admitting: *Deleted

## 2017-10-08 DIAGNOSIS — I2510 Atherosclerotic heart disease of native coronary artery without angina pectoris: Secondary | ICD-10-CM | POA: Insufficient documentation

## 2017-10-08 DIAGNOSIS — D1432 Benign neoplasm of left bronchus and lung: Secondary | ICD-10-CM | POA: Insufficient documentation

## 2017-10-08 DIAGNOSIS — M069 Rheumatoid arthritis, unspecified: Secondary | ICD-10-CM | POA: Insufficient documentation

## 2017-10-08 DIAGNOSIS — Z419 Encounter for procedure for purposes other than remedying health state, unspecified: Secondary | ICD-10-CM

## 2017-10-08 DIAGNOSIS — E785 Hyperlipidemia, unspecified: Secondary | ICD-10-CM | POA: Diagnosis not present

## 2017-10-08 DIAGNOSIS — Z09 Encounter for follow-up examination after completed treatment for conditions other than malignant neoplasm: Secondary | ICD-10-CM

## 2017-10-08 DIAGNOSIS — I252 Old myocardial infarction: Secondary | ICD-10-CM | POA: Diagnosis not present

## 2017-10-08 DIAGNOSIS — M5090 Cervical disc disorder, unspecified, unspecified cervical region: Secondary | ICD-10-CM | POA: Insufficient documentation

## 2017-10-08 DIAGNOSIS — N189 Chronic kidney disease, unspecified: Secondary | ICD-10-CM | POA: Diagnosis not present

## 2017-10-08 DIAGNOSIS — I129 Hypertensive chronic kidney disease with stage 1 through stage 4 chronic kidney disease, or unspecified chronic kidney disease: Secondary | ICD-10-CM | POA: Diagnosis not present

## 2017-10-08 DIAGNOSIS — R911 Solitary pulmonary nodule: Secondary | ICD-10-CM

## 2017-10-08 DIAGNOSIS — M353 Polymyalgia rheumatica: Secondary | ICD-10-CM | POA: Diagnosis not present

## 2017-10-08 DIAGNOSIS — E039 Hypothyroidism, unspecified: Secondary | ICD-10-CM | POA: Diagnosis not present

## 2017-10-08 DIAGNOSIS — Z951 Presence of aortocoronary bypass graft: Secondary | ICD-10-CM | POA: Insufficient documentation

## 2017-10-08 DIAGNOSIS — R918 Other nonspecific abnormal finding of lung field: Secondary | ICD-10-CM | POA: Diagnosis present

## 2017-10-08 DIAGNOSIS — Z8673 Personal history of transient ischemic attack (TIA), and cerebral infarction without residual deficits: Secondary | ICD-10-CM | POA: Insufficient documentation

## 2017-10-08 DIAGNOSIS — J449 Chronic obstructive pulmonary disease, unspecified: Secondary | ICD-10-CM | POA: Insufficient documentation

## 2017-10-08 DIAGNOSIS — Z87891 Personal history of nicotine dependence: Secondary | ICD-10-CM | POA: Insufficient documentation

## 2017-10-08 DIAGNOSIS — R222 Localized swelling, mass and lump, trunk: Secondary | ICD-10-CM | POA: Diagnosis not present

## 2017-10-08 HISTORY — PX: VIDEO BRONCHOSCOPY WITH ENDOBRONCHIAL NAVIGATION: SHX6175

## 2017-10-08 LAB — APTT: aPTT: 27 seconds (ref 24–36)

## 2017-10-08 LAB — COMPREHENSIVE METABOLIC PANEL
ALT: 27 U/L (ref 14–54)
AST: 28 U/L (ref 15–41)
Albumin: 3.7 g/dL (ref 3.5–5.0)
Alkaline Phosphatase: 99 U/L (ref 38–126)
Anion gap: 10 (ref 5–15)
BUN: 33 mg/dL — ABNORMAL HIGH (ref 6–20)
CO2: 25 mmol/L (ref 22–32)
Calcium: 9.8 mg/dL (ref 8.9–10.3)
Chloride: 107 mmol/L (ref 101–111)
Creatinine, Ser: 1.53 mg/dL — ABNORMAL HIGH (ref 0.44–1.00)
GFR calc Af Amer: 36 mL/min — ABNORMAL LOW (ref >60)
GFR calc non Af Amer: 31 mL/min — ABNORMAL LOW (ref >60)
Glucose, Bld: 93 mg/dL (ref 65–99)
Potassium: 4.6 mmol/L (ref 3.5–5.1)
Sodium: 142 mmol/L (ref 135–145)
Total Bilirubin: 1 mg/dL (ref 0.3–1.2)
Total Protein: 6.3 g/dL — ABNORMAL LOW (ref 6.5–8.1)

## 2017-10-08 LAB — PROTIME-INR
INR: 0.98
Prothrombin Time: 12.8 seconds (ref 11.4–15.2)

## 2017-10-08 LAB — CBC
HCT: 40.7 % (ref 36.0–46.0)
Hemoglobin: 13.1 g/dL (ref 12.0–15.0)
MCH: 30 pg (ref 26.0–34.0)
MCHC: 32.2 g/dL (ref 30.0–36.0)
MCV: 93.1 fL (ref 78.0–100.0)
Platelets: 348 10*3/uL (ref 150–400)
RBC: 4.37 MIL/uL (ref 3.87–5.11)
RDW: 15.1 % (ref 11.5–15.5)
WBC: 10.2 10*3/uL (ref 4.0–10.5)

## 2017-10-08 SURGERY — VIDEO BRONCHOSCOPY WITH ENDOBRONCHIAL NAVIGATION
Anesthesia: General | Site: Mouth

## 2017-10-08 MED ORDER — EPINEPHRINE PF 1 MG/ML IJ SOLN
INTRAMUSCULAR | Status: AC
  Filled 2017-10-08: qty 1

## 2017-10-08 MED ORDER — DEXAMETHASONE SODIUM PHOSPHATE 10 MG/ML IJ SOLN
INTRAMUSCULAR | Status: DC | PRN
  Administered 2017-10-08: 10 mg via INTRAVENOUS

## 2017-10-08 MED ORDER — EPINEPHRINE PF 1 MG/ML IJ SOLN
INTRAMUSCULAR | Status: DC | PRN
  Administered 2017-10-08: 1 mg

## 2017-10-08 MED ORDER — PHENYLEPHRINE HCL 10 MG/ML IJ SOLN
INTRAVENOUS | Status: DC | PRN
  Administered 2017-10-08: 40 ug/min via INTRAVENOUS

## 2017-10-08 MED ORDER — PROPOFOL 10 MG/ML IV BOLUS
INTRAVENOUS | Status: DC | PRN
  Administered 2017-10-08: 30 mg via INTRAVENOUS
  Administered 2017-10-08: 120 mg via INTRAVENOUS

## 2017-10-08 MED ORDER — LACTATED RINGERS IV SOLN
INTRAVENOUS | Status: DC | PRN
  Administered 2017-10-08: 09:00:00 via INTRAVENOUS

## 2017-10-08 MED ORDER — PHENYLEPHRINE 40 MCG/ML (10ML) SYRINGE FOR IV PUSH (FOR BLOOD PRESSURE SUPPORT)
PREFILLED_SYRINGE | INTRAVENOUS | Status: DC | PRN
  Administered 2017-10-08: 80 ug via INTRAVENOUS

## 2017-10-08 MED ORDER — FENTANYL CITRATE (PF) 250 MCG/5ML IJ SOLN
INTRAMUSCULAR | Status: AC
  Filled 2017-10-08: qty 5

## 2017-10-08 MED ORDER — 0.9 % SODIUM CHLORIDE (POUR BTL) OPTIME
TOPICAL | Status: DC | PRN
  Administered 2017-10-08: 1000 mL

## 2017-10-08 MED ORDER — DEXAMETHASONE SODIUM PHOSPHATE 10 MG/ML IJ SOLN
INTRAMUSCULAR | Status: AC
  Filled 2017-10-08: qty 1

## 2017-10-08 MED ORDER — ONDANSETRON HCL 4 MG/2ML IJ SOLN
INTRAMUSCULAR | Status: DC | PRN
  Administered 2017-10-08: 4 mg via INTRAVENOUS

## 2017-10-08 MED ORDER — SUGAMMADEX SODIUM 200 MG/2ML IV SOLN
INTRAVENOUS | Status: DC | PRN
  Administered 2017-10-08: 150 mg via INTRAVENOUS

## 2017-10-08 MED ORDER — LIDOCAINE 2% (20 MG/ML) 5 ML SYRINGE
INTRAMUSCULAR | Status: AC
  Filled 2017-10-08: qty 5

## 2017-10-08 MED ORDER — FENTANYL CITRATE (PF) 100 MCG/2ML IJ SOLN
INTRAMUSCULAR | Status: DC | PRN
  Administered 2017-10-08 (×2): 25 ug via INTRAVENOUS
  Administered 2017-10-08: 50 ug via INTRAVENOUS
  Administered 2017-10-08: 25 ug via INTRAVENOUS
  Administered 2017-10-08: 50 ug via INTRAVENOUS

## 2017-10-08 MED ORDER — ONDANSETRON HCL 4 MG/2ML IJ SOLN
INTRAMUSCULAR | Status: AC
  Filled 2017-10-08: qty 2

## 2017-10-08 MED ORDER — ROCURONIUM BROMIDE 100 MG/10ML IV SOLN
INTRAVENOUS | Status: DC | PRN
  Administered 2017-10-08: 10 mg via INTRAVENOUS
  Administered 2017-10-08: 40 mg via INTRAVENOUS

## 2017-10-08 MED ORDER — SUGAMMADEX SODIUM 200 MG/2ML IV SOLN
INTRAVENOUS | Status: AC
  Filled 2017-10-08: qty 2

## 2017-10-08 MED ORDER — PROPOFOL 10 MG/ML IV BOLUS
INTRAVENOUS | Status: AC
  Filled 2017-10-08: qty 20

## 2017-10-08 MED ORDER — ROCURONIUM BROMIDE 10 MG/ML (PF) SYRINGE
PREFILLED_SYRINGE | INTRAVENOUS | Status: AC
  Filled 2017-10-08: qty 5

## 2017-10-08 MED ORDER — LACTATED RINGERS IV SOLN
Freq: Once | INTRAVENOUS | Status: AC
  Administered 2017-10-08: 09:00:00 via INTRAVENOUS

## 2017-10-08 MED ORDER — FENTANYL CITRATE (PF) 100 MCG/2ML IJ SOLN: 25.0000 ug | INTRAMUSCULAR | Status: DC | PRN

## 2017-10-08 SURGICAL SUPPLY — 43 items
ADAPTER BRONCH F/PENTAX (ADAPTER) ×2 IMPLANT
ADPR BSCP EDG PNTX (ADAPTER) ×1
BRUSH BIOPSY BRONCH 10 SDTNB (MISCELLANEOUS) ×2 IMPLANT
BRUSH SUPERTRAX BIOPSY (INSTRUMENTS) ×2 IMPLANT
BRUSH SUPERTRAX NDL-TIP CYTO (INSTRUMENTS) ×2 IMPLANT
CANISTER SUCT 3000ML PPV (MISCELLANEOUS) ×2 IMPLANT
CHANNEL WORK EXTEND EDGE 180 (KITS) IMPLANT
CHANNEL WORK EXTEND EDGE 45 (KITS) IMPLANT
CHANNEL WORK EXTEND EDGE 90 (KITS) IMPLANT
CONT SPEC 4OZ CLIKSEAL STRL BL (MISCELLANEOUS) ×4 IMPLANT
COVER BACK TABLE 60X90IN (DRAPES) ×2 IMPLANT
FILTER STRAW FLUID ASPIR (MISCELLANEOUS) IMPLANT
FORCEPS BIOP SUPERTRX PREMAR (INSTRUMENTS) ×1 IMPLANT
GAUZE SPONGE 4X4 12PLY STRL (GAUZE/BANDAGES/DRESSINGS) ×2 IMPLANT
GLOVE BIO SURGEON STRL SZ 6.5 (GLOVE) ×2 IMPLANT
GLOVE BIOGEL PI IND STRL 6.5 (GLOVE) IMPLANT
GLOVE BIOGEL PI INDICATOR 6.5 (GLOVE) ×1
GLOVE SURG SS PI 6.5 STRL IVOR (GLOVE) ×1 IMPLANT
GOWN STRL NON-REIN LRG LVL3 (GOWN DISPOSABLE) ×1 IMPLANT
KIT CLEAN ENDO COMPLIANCE (KITS) ×2 IMPLANT
KIT PROCEDURE EDGE 180 (KITS) IMPLANT
KIT PROCEDURE EDGE 45 (KITS) IMPLANT
KIT PROCEDURE EDGE 90 (KITS) ×1 IMPLANT
KIT TURNOVER KIT B (KITS) ×2 IMPLANT
MARKER SKIN DUAL TIP RULER LAB (MISCELLANEOUS) ×2 IMPLANT
NDL HYPO 25GX1X1/2 BEV (NEEDLE) IMPLANT
NDL SUPERTRX PREMARK BIOPSY (NEEDLE) IMPLANT
NEEDLE HYPO 25GX1X1/2 BEV (NEEDLE) ×2 IMPLANT
NEEDLE SUPERTRX PREMARK BIOPSY (NEEDLE) ×4 IMPLANT
NS IRRIG 1000ML POUR BTL (IV SOLUTION) ×2 IMPLANT
OIL SILICONE PENTAX (PARTS (SERVICE/REPAIRS)) ×2 IMPLANT
PAD ARMBOARD 7.5X6 YLW CONV (MISCELLANEOUS) ×3 IMPLANT
PATCHES PATIENT (LABEL) ×6 IMPLANT
SYR 20CC LL (SYRINGE) IMPLANT
SYR 20ML ECCENTRIC (SYRINGE) ×3 IMPLANT
SYR 3ML LL SCALE MARK (SYRINGE) ×2 IMPLANT
TOWEL GREEN STERILE (TOWEL DISPOSABLE) ×2 IMPLANT
TOWEL GREEN STERILE FF (TOWEL DISPOSABLE) ×2 IMPLANT
TRAP SPECIMEN MUCOUS 40CC (MISCELLANEOUS) ×2 IMPLANT
TUBE CONNECTING 20X1/4 (TUBING) ×2 IMPLANT
UNDERPAD 30X30 (UNDERPADS AND DIAPERS) ×2 IMPLANT
VALVE DISPOSABLE (MISCELLANEOUS) ×2 IMPLANT
WATER STERILE IRR 1000ML POUR (IV SOLUTION) ×2 IMPLANT

## 2017-10-08 NOTE — Progress Notes (Signed)
Dr Orene Desanctis called regarding elevated BP 191/72. NO new orders given- pt to resume home meds upon discharge

## 2017-10-08 NOTE — Progress Notes (Signed)
Spoke with Dr Servando Snare regarding patients chest x ray results showing small left pleural effusion. Dr Servando Snare states okay to proceed with discharge.

## 2017-10-08 NOTE — Transfer of Care (Signed)
Immediate Anesthesia Transfer of Care Note  Patient: [name]  Procedure(s) Performed: VIDEO BRONCHOSCOPY WITH ENDOBRONCHIAL NAVIGATION WITH BIOPSIES OF LEFT UPPER LOBE AND LEFT LOWER LOBE (N/A Mouth)  Patient Location: PACU  Anesthesia Type:General  Level of Consciousness: awake, alert  and oriented  Airway & Oxygen Therapy: Patient Spontanous Breathing and Patient connected to nasal cannula oxygen  Post-op Assessment: Report given to RN, Post -op Vital signs reviewed and stable and Patient moving all extremities  Post vital signs: Reviewed and stable  Last Vitals:  Vitals Value Taken Time  BP 171/63 10/08/2017  1:28 PM  Temp 36.6 C 10/08/2017  1:28 PM  Pulse 70 10/08/2017  1:30 PM  Resp 16 10/08/2017  1:30 PM  SpO2 99 % 10/08/2017  1:30 PM  Vitals shown include unvalidated device data.  Last Pain:  Vitals:   10/08/17 1328  TempSrc:   PainSc: 0-No pain      Patients Stated Pain Goal: 4 (58/48/35 0757)  Complications: No apparent anesthesia complications

## 2017-10-08 NOTE — Anesthesia Preprocedure Evaluation (Addendum)
Anesthesia Evaluation  Patient identified by MRN, date of birth, ID band Patient awake    Reviewed: Allergy & Precautions, NPO status , Patient's Chart, lab work & pertinent test results  Airway Mallampati: II  TM Distance: <3 FB     Dental  (+) Teeth Intact, Dental Advisory Given   Pulmonary shortness of breath, COPD, former smoker,    breath sounds clear to auscultation       Cardiovascular hypertension, + CAD, + Past MI and + Peripheral Vascular Disease   Rhythm:Regular Rate:Normal     Neuro/Psych TIACVA    GI/Hepatic negative GI ROS,   Endo/Other  diabetesHypothyroidism   Renal/GU Renal disease     Musculoskeletal   Abdominal   Peds  Hematology   Anesthesia Other Findings   Reproductive/Obstetrics                           Anesthesia Physical Anesthesia Plan  ASA: III  Anesthesia Plan: General   Post-op Pain Management:    Induction: Intravenous  PONV Risk Score and Plan: Treatment may vary due to age or medical condition  Airway Management Planned: Oral ETT  Additional Equipment:   Intra-op Plan:   Post-operative Plan: Extubation in OR and Possible Post-op intubation/ventilation  Informed Consent:   Dental advisory given  Plan Discussed with:   Anesthesia Plan Comments:         Anesthesia Quick Evaluation

## 2017-10-08 NOTE — Discharge Instructions (Signed)
Resume Plavix tomorrow   Flexible Bronchoscopy, Care After This sheet gives you information about how to care for yourself after your procedure. Your health care provider may also give you more specific instructions. If you have problems or questions, contact your health care provider. What can I expect after the procedure? After the procedure, it is common to have the following symptoms for 24-48 hours:  A cough that is worse than it was before the procedure.  A low-grade fever.  A sore throat or hoarse voice.  Small streaks of blood in the mucus from your lungs (sputum), if tissue samples were removed (biopsy).  Follow these instructions at home: Eating and drinking  Do not eat or drink anything (including water) for 2 hours after your procedure, or until your numbing medicine (local anesthetic) has worn off. Having a numb throat increases your risk of burning yourself or choking.  After your numbness is gone and your cough and gag reflexes have returned, you may start eating only soft foods and slowly drinking liquids.  The day after the procedure, return to your normal diet. Driving  Do not drive for 24 hours if you were given a medicine to help you relax (sedative).  Do not drive or use heavy machinery while taking prescription pain medicine. General instructions  Take over-the-counter and prescription medicines only as told by your health care provider.  Return to your normal activities as told by your health care provider. Ask your health care provider what activities are safe for you.  Do not use any products that contain nicotine or tobacco, such as cigarettes and e-cigarettes. If you need help quitting, ask your health care provider.  Keep all follow-up visits as told by your health care provider. This is important, especially if you had a biopsy taken. Get help right away if:  You have shortness of breath that gets worse.  You become light-headed or feel like you  might faint.  You have chest pain.  You cough up more than a small amount of blood.  The amount of blood you cough up increases. Summary  Common symptoms in the 24-48 hours following a flexible bronchoscopy include cough, low-grade fever, sore throat or hoarse voice, and blood-streaked mucus from the lungs (if you had a biopsy).  Do not eat or drink anything (including water) for 2 hours after your procedure, or until your local anesthetic has worn off. You can return to your normal diet the day after the procedure.  Get help right away if you develop worsening shortness of breath, have chest pain, become light-headed, or cough up more than a small amount of blood. This information is not intended to replace advice given to you by your health care provider. Make sure you discuss any questions you have with your health care provider. Document Released: 11/04/2004 Document Revised: 05/05/2016 Document Reviewed: 05/05/2016 Elsevier Interactive Patient Education  2017 [employer].

## 2017-10-08 NOTE — Anesthesia Postprocedure Evaluation (Signed)
Anesthesia Post Note  Patient: [name]  Procedure(s) Performed: VIDEO BRONCHOSCOPY WITH ENDOBRONCHIAL NAVIGATION WITH BIOPSIES OF LEFT UPPER LOBE AND LEFT LOWER LOBE (N/A Mouth)     Patient location during evaluation: PACU Anesthesia Type: General Level of consciousness: awake and alert Pain management: pain level controlled Vital Signs Assessment: post-procedure vital signs reviewed and stable Respiratory status: spontaneous breathing, nonlabored ventilation, respiratory function stable and patient connected to nasal cannula oxygen Cardiovascular status: blood pressure returned to baseline and stable Postop Assessment: no apparent nausea or vomiting Anesthetic complications: no    Last Vitals:  Vitals:   10/08/17 1411 10/08/17 1428  BP: (!) 159/88 (!) 150/69  Pulse: 66 66  Resp: 16 18  Temp: 36.7 C   SpO2: 98% 100%    Last Pain:  Vitals:   10/08/17 1345  TempSrc:   PainSc: 0-No pain                 Oaklan Persons,JAMES TERRILL

## 2017-10-08 NOTE — Brief Op Note (Signed)
      [address]       [place] [address]             [phone]     10/08/2017  1:17 PM  PATIENT:  Barbaraann Boys  77 y.o. female  PRE-OPERATIVE DIAGNOSIS:  LEFT LUNG NODULEs  POST-OPERATIVE DIAGNOSIS:  LEFT LUNG Nodules preliminary no CA  PROCEDURE:  Procedure(s): VIDEO BRONCHOSCOPY WITH ENDOBRONCHIAL NAVIGATION WITH BIOPSIES OF LEFT UPPER LOBE AND LEFT LOWER LOBE (N/A)  SURGEON:  Surgeon(s) and Role:    * Grace Isaac, MD - Primary    ANESTHESIA:   general  EBL: none  BLOOD ADMINISTERED:none  DRAINS: none   LOCAL MEDICATIONS USED:  NONE  SPECIMEN:  Source of Specimen:  left upper and left lower lobe  DISPOSITION OF SPECIMEN:  PATHOLOGY  COUNTS:  YES   DICTATION: .Dragon Dictation  PLAN OF CARE: Discharge to home after PACU  PATIENT DISPOSITION:  PACU - hemodynamically stable.   Delay start of Pharmacological VTE agent (>24hrs) due to surgical blood loss or risk of bleeding: yes

## 2017-10-08 NOTE — Anesthesia Procedure Notes (Addendum)
Procedure Name: Intubation Date/Time: 10/08/2017 11:28 AM Performed by: Kyung Rudd, CRNA Pre-anesthesia Checklist: Patient identified, Emergency Drugs available, Suction available, Patient being monitored and Timeout performed Patient Re-evaluated:Patient Re-evaluated prior to induction Oxygen Delivery Method: Circle system utilized Preoxygenation: Pre-oxygenation with 100% oxygen Induction Type: IV induction and Cricoid Pressure applied Ventilation: Mask ventilation without difficulty Laryngoscope Size: Mac and 3 Grade View: Grade III Tube type: Oral Tube size: 8.5 mm Number of attempts: 2 Airway Equipment and Method: Stylet Placement Confirmation: positive ETCO2 and breath sounds checked- equal and bilateral Secured at: 23 cm Tube secured with: Tape Dental Injury: Teeth and Oropharynx as per pre-operative assessment and Injury to lip  Comments: 1st DL with Miller 2 by [facility], unable to pass ETT. 2nd DL with MAC 3, Grade III view by CRNA. 8.5 ETT passed.

## 2017-10-08 NOTE — H&P (Signed)
[name]       [place] [address]             [phone]      [name] Medical Record #[id number] Date of Birth: June 04, 1940  Referring: No ref. provider found Primary Care: [name], Sherry Lacks, [name]  Chief Complaint:   POST OP FOLLOW UP 03/02/2015 OPERATIVE REPORT PREOPERATIVE DIAGNOSIS: Left upper lobe lung mass POSTOPERATIVE DIAGNOSIS: Left upper lobe lung mass. 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 PROCEDURE PERFORMED: Video bronchoscopy, left video-assisted thoracoscopy, minithoracotomy, wedge resection of left upper lobe. SURGEON: [name], [name] Previous left VATS lung resection Lung, wedge biopsy/resection, left upper lobe - NECROTIZING GRANULOMA, 1.7 CM. - NO TUMOR SEEN. - SEE COMMENT. Microscopic Comment Sections demonstrate a 1.7 cm necrotizing granuloma. There is associated reactive pneumocyte hyperplasia with reactive atypia but no true tumor is identified. An AFB stain is performed on a representative section which is negative for acid fast bacilli. A GMS and PAS stain are also performed on a representative section which are both negative for fungal organisms. Although specimen stains for microorganisms are negative, stains are not as sensitive as other microbiologic techniques including culture. Please correlate with culture results. (RAH:gt, 03/04/15) [name] Pathologist, Electronic Signature (Case signed 03/04/2015  History of Present Illness:     Patient was  admitted to [facility] in December 2017 with respiratory problems  , a CT scan of the chest was done at that time the report notes recurrent malignancy and old stable line, however the patient has no previous history of lung cancer nor lobectomy.  Repeat ct of chest done with enlarging are left upper lobe pleural based, not at area of previous wedge resection (patient did not have lobectomy as some notes indicate, this was  interpreted as recurrent malignancy however the patient previous surgery was not related to malignancy, she has no previous history of lung cancer.  Patient came last week  with a follow-up CT scan, to evaluate a approximately 1 cm nodule in the left lower lobe at the diaphragm, and multiple other smaller nodes in the left and right lung.   Since last seen she has had no new symptoms no cough fever chills.     She did smoke for 5-7 years but quit at age 47. With her limited smoking history she would not be eligible for lung cancer screening CT program.  Past Medical History:  Diagnosis Date  . Abdominal aortic aneurysm (HCC)    REPAIRED IN 1996 BY [name]  AND HAS RECENTLY BEEN FOLLOWED BY [name]  . 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 Adenomatous colon polyp    tubular  . Anemia   . CAD in native artery 1996, 2002, 2005    Status post CABG x1 with LIMA-LAD for ostial LAD 90% stenosis --> down to 50% in 2002 and 30% in 2005.;  Atretic LIMA; Myoview 06/2010: Fixed anteroseptal, apical and inferoapical defect with moderate size. Most likely scar. Mild subendocardial ischemia. EF 71% LOW RISK.   [name] Cervical disc disease   . Chronic kidney disease   . COPD (chronic obstructive pulmonary disease) ([place])   . Diverticulosis   . Hyperlipidemia   . Hypertension   . Hypothyroidism (acquired)    hypo  . Myocardial infarction [facility]) Aug. 2016   TIA  . 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 Charlestine Night  . Rheumatoid arthritis ([place]) 2011   Dr.Truslow; fracture knees, hands and wrists -   . S/P CABG x 1 1996   CABG--LIMA-LAD for ostial LAD (not felt to be PCI amenable). EF NORMAL then; LIMA now atretic  . Shortness of breath dyspnea    with exertion  . Stroke [facility]) 11-2014   TIA   . Urinary frequency      Social History   Tobacco Use  Smoking Status Former Smoker  . Last attempt to quit: 12/04/1958  . Years  since quitting: 13.8  Smokeless Tobacco Never Used    Social History   Substance and Sexual Activity  Alcohol Use No     Allergies  Allergen Reactions  . Aspirin Other (See Comments)    Hurts stomach  . Codeine Nausea And Vomiting  . Nitroglycerin Other (See Comments)    Heart rate drops    No current facility-administered medications for this encounter.     Review of Systems  Constitutional: Negative.   HENT: Negative.   Eyes: Negative.   Respiratory: Negative.   Cardiovascular: Negative.  Negative for chest pain, palpitations, orthopnea, claudication, leg swelling and PND.  Gastrointestinal: Negative.   Genitourinary: Negative.   Musculoskeletal: Negative.   Skin: Negative.   Neurological: Negative.   Endo/Heme/Allergies: Bruises/bleeds easily.  Psychiatric/Behavioral: Negative.      Physical Exam: BP (!) 168/46   Pulse 61   Temp (!) 97.5 F (36.4 C) (Oral)   Resp 20   Wt 148 lb (67.1 kg)   SpO2 99%   BMI 26.22 kg/m  General appearance: alert, cooperative, appears stated age and no distress Head: Normocephalic, without obvious abnormality, atraumatic Neck: no adenopathy, no carotid bruit, no JVD, supple, symmetrical, trachea midline and thyroid not enlarged, symmetric, no tenderness/mass/nodules Lymph nodes: Cervical, supraclavicular, and axillary nodes normal. Resp: clear to auscultation bilaterally Cardio: regular rate and rhythm, S1, S2 normal, no murmur, click, rub or gallop GI: soft, non-tender; bowel sounds normal; no masses,  no organomegaly Extremities: extremities normal, atraumatic, no cyanosis or edema and Homans sign is negative, no sign of DVT Neurologic: Grossly normal   Diagnostic Studies & Laboratory data:     Recent Radiology Findings:  Ct Super D Chest Wo Contrast  Result Date: 10/04/2017 CLINICAL DATA:  Lung nodule. EXAM: CT CHEST WITHOUT CONTRAST TECHNIQUE: Multidetector CT imaging of the chest was performed using thin slice  collimation for electromagnetic bronchoscopy planning purposes, without intravenous contrast. COMPARISON:  CT chest 06/28/2017. FINDINGS: Cardiovascular: The heart size appears within normal limits. No pericardial effusion. Previous median sternotomy and CABG procedure. Aortic atherosclerosis noted. Mediastinum/Nodes: Normal appearance of the thyroid gland. The trachea appears patent and is midline. Normal appearance of the esophagus. No enlarged mediastinal or hilar lymph nodes. No axillary or supraclavicular adenopathy. Lungs/Pleura: Postoperative changes involving the left upper lobe are again noted. The index nodule within the left upper lobe measures 7 mm, image 18/8. This is compared with 4 mm previously. The index nodule within the left lower lobe measures 1.2 x 1.2 cm, image 117/8. Previously 1.1 x 0.9 cm. 4 mm right lower lobe lung nodule is noted, image 125/8. Previously this measured the same. Within the medial right lower lobe there is an 8 mm subpleural nodule, image 72/2. Stable from comparison exam. In the right upper lobe there is a small nodule measuring 5 mm, image 43/8. Stable from previous exam. Upper Abdomen: Lobular appearing left kidney noted. The adrenal glands are unremarkable. No acute  abnormality noted. Musculoskeletal: Mild degenerative disc disease noted throughout the thoracic spine. No suspicious bone lesions. IMPRESSION: 1. There has been increase in size of left lower lobe lung nodule compared with 06/28/2017. Small left upper lobe lung nodule is also increased in size. 2. Right lower lobe and right upper lobe lung nodules are stable in the interval. 3.  Aortic Atherosclerosis (ICD10-I70.0). Electronically Signed   By: [name].   On: 10/04/2017 10:57   I have independently reviewed the above radiology studies  and reviewed the findings with the patient.    CLINICAL DATA:  Subsequent treatment strategy for lung nodule.  EXAM: NUCLEAR MEDICINE PET SKULL BASE TO  THIGH  TECHNIQUE: 7.8 mCi F-18 FDG was injected intravenously. Full-ring PET imaging was performed from the skull base to thigh after the radiotracer. CT data was obtained and used for attenuation correction and anatomic localization.  Fasting blood glucose: 100 mg/dl  Mediastinal blood pool activity: SUV max 3.2  COMPARISON:  CT chest 06/28/2017, 06/08/2016, 04/15/2016, PET 02/10/2015 and CT chest 01/29/2015.  FINDINGS: NECK: No hypermetabolic lymph nodes in the neck.  Incidental CT findings: None.  CHEST: Nodular scarring in the medial left upper lobe does not show abnormal hypermetabolism. A 10 mm round nodule in the inferior left lower lobe has an SUV max 3.4, is stable in size from 06/28/2017 but new from CT chest 06/08/2016. No hypermetabolic mediastinal, hilar or axillary lymph nodes.  Incidental CT findings: Atherosclerotic calcification of the arterial vasculature. Heart is enlarged. No pericardial or pleural effusion. 6 mm apical left upper lobe nodule (series 8, image 11), too small for PET resolution.  ABDOMEN/PELVIS: No abnormal hypermetabolism in the liver, adrenal glands, spleen or pancreas. No hypermetabolic lymph nodes.  Incidental CT findings: Low-attenuation lesions in the kidneys are difficult to further characterize without post-contrast imaging. Splenectomy.  SKELETON: No abnormal osseous hypermetabolism.  Incidental CT findings: Degenerative changes in the spine.  IMPRESSION: 1. Mildly hypermetabolic left lower lobe nodule, stable in size from 06/28/2017 but new from 06/08/2016. Findings are indeterminate with both malignant and infectious etiologies considered, given the short interval from 06/28/2017. Consider follow-up CT chest in 3-4 weeks in further initial evaluation. 2. 6 mm apical left upper lobe nodule is too small for PET resolution. 3.  Aortic atherosclerosis (ICD10-170.0).   Electronically Signed   By: [name].   On: 07/05/2017 09:09   I have independently reviewed the above radiology studies  and reviewed the findings with the patient.   Dg Chest 2 View  Result Date: 04/17/2016 CLINICAL DATA:  Productive cough for the past month. EXAM: CHEST  2 VIEW COMPARISON:  04/15/2016; 07/22/2015; chest CT - 04/15/2016 FINDINGS: Grossly unchanged cardiac silhouette and mediastinal contours post median sternotomy. Atherosclerotic plaque within the thoracic aorta. The lungs remain hyperexpanded with flattening of the diaphragms and thinning of the biapical pulmonary parenchyma. Known ill-defined soft tissue about the left upper lobe surgical resection site is not well demonstrated on the present examination. No focal airspace opacities. No pleural effusion or pneumothorax. No evidence of edema. IMPRESSION: 1. Hyperexpanded lungs without acute cardiopulmonary disease. Emphysema. (ICD10-J43.9) 2. Previously identified soft tissue about the left upper lobe surgical resection site is suboptimally evaluated on the present examination. Correlation with report from chest CT performed 04/15/2016 is recommended. Electronically Signed   By: [name].   On: 04/17/2016 15:42     Ct Angio Chest Pe W And/or Wo Contrast  Result Date: 04/15/2016 CLINICAL DATA:  Elevated D-dimer.  Productive cough congestion. EXAM: CT ANGIOGRAPHY CHEST WITH CONTRAST TECHNIQUE: Multidetector CT imaging of the chest was performed using the standard protocol during bolus administration of intravenous contrast. Multiplanar CT image reconstructions and MIPs were obtained to evaluate the vascular anatomy. CONTRAST:  80 cc Isovue 370 IV COMPARISON:  11/23/2015 FINDINGS: Cardiovascular: No filling defects in the pulmonary arteries to suggest pulmonary emboli. Heart is normal size. Prior CABG. Aorta is tortuous with scattered calcifications, non aneurysmal. Mediastinum/Nodes: Mildly enlarged mediastinal lymph nodes. Subcarinal lymph node has  a short axis diameter of 13 mm. Other small scattered paratracheal and AP window lymph nodes. No hilar or axillary adenopathy. Lungs/Pleura: Increasing density noted in the medial left upper lobe in the area of prior postoperative change. This soft tissue measures 2.2 x 1.7 cm on image 15. Given the location in the postoperative bed, this is concerning for tumor recurrence. Nodule in the left lower lobe on image 38 measures 8 mm on image 38, stable. Scarring noted in the lung bases. No pleural effusions. Upper Abdomen: Imaging into the upper abdomen shows no acute findings. Musculoskeletal: Chest wall soft tissues are unremarkable. No acute bony abnormality or focal bone lesion. Review of the MIP images confirms the above findings. IMPRESSION: Enlarging abnormal soft tissue in the left upper lobe at the prior surgical site concerning for recurrent tumor. This measures up to 2.2 cm. This could be further evaluated with PET CT. Stable left lower lobe pulmonary nodule. Borderline and mildly enlarged mediastinal lymph nodes. Electronically Signed   By: [name].   On: 04/15/2016 15:26    Ct Chest Wo Contrast  Result Date: 11/23/2015 CLINICAL DATA:  Followup left lung nodule. Previous wedge resection revealed necrotizing granuloma. EXAM: CT CHEST WITHOUT CONTRAST TECHNIQUE: Multidetector CT imaging of the chest was performed following the standard protocol without IV contrast. COMPARISON:  01/29/2015 FINDINGS: Cardiovascular: Heart size within normal limits. Aortic atherosclerosis noted. Previous CABG. Mediastinum/Lymph Nodes: No masses or pathologically enlarged lymph nodes identified on this un-enhanced exam. Lungs/Pleura: Postop changes seen in the medial left upper lobe from previous wedge resection. 8 mm mean diameter perifissural nodule nodule is seen in the left midlung on image 66/4. This is new since previous study, but likely represents an intrapulmonary lymph node. Mild centrilobular emphysema  noted. Bibasilar scarring and tiny sub-cm posterior right lower lobe pulmonary nodule remains stable. No evidence of acute infiltrate or pleural effusion. Upper abdomen: No acute findings. Musculoskeletal: No chest wall mass or suspicious bone lesions identified. IMPRESSION: Postop changes in left upper lobe. 8 mm mean diameter perifissural nodule in the left midlung is new, but likely represents an intrapulmonary lymph node. Non-contrast chest CT at 6-12 months is recommended. If the nodule is stable at time of repeat CT, then future CT at 18-24 months (from today's scan) is considered optional for low-risk patients, but is recommended for high-risk patients. This recommendation follows the consensus statement: Guidelines for Management of Incidental Pulmonary Nodules Detected on CT Images:From the Fleischner Society 2017; published online before print (10.1148/radiol.6629476546). Aortic atherosclerosis and emphysema. Electronically Signed   By: [name].   On: 11/23/2015 15:46    Recent Lab Findings: Lab Results  Component Value Date   WBC 10.2 10/08/2017   HGB 13.1 10/08/2017   HCT 40.7 10/08/2017   PLT 348 10/08/2017   GLUCOSE 93 10/08/2017   CHOL 165 02/27/2017   TRIG 96.0 02/27/2017   HDL 69.80 02/27/2017   LDLDIRECT 162.1 10/28/2009  LDLCALC 76 02/27/2017   ALT 27 10/08/2017   AST 28 10/08/2017   NA 142 10/08/2017   K 4.6 10/08/2017   CL 107 10/08/2017   CREATININE 1.53 (H) 10/08/2017   BUN 33 (H) 10/08/2017   CO2 25 10/08/2017   TSH 4.54 (H) 08/24/2017   INR 0.98 10/08/2017   10/2015   03/2016   PFT's 10 /2016 FEV1 1.58 80% DLCO 16.27 71%  Interpretation: The FEV1 is normal, but the FEV1/FVC ratio and FEF25-75% are reduced. While the TLC, FRC and SVC are within normal limits, the RV is increased. Following administration of bronchodilators, there is a slight response. The reduced diffusing capacity indicates a mild loss of functional alveolar capillary  surface. However, the diffusing capacity was not corrected for the patient's hemoglobin. 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   Follow-up CT scan follow-up scans have shown Interval development of 11 mm nodule in left lower lobe  And  new 5 mm nodule in left lung apex  Scan today shows slight enlargement of the nodules over a short interval 3 months, the scan was done as a super D format.  I have reviewed the scans with patient with her previous history this may be inflammatory, however malignancy cannot be ruled out.  Since there has been enlargement of recommend to the patient that we have make an attempt at obtaining a tissue diagnosis especially of the left lower lobe nodule with navigation bronchoscopy.  The risk and options including anesthetic complications and/or pneumothorax been discussed with the patient in detail.  She is on Plavix and will discontinue her Plavix 5 days ago , will plan for bronchoscopy with navigation bronchoscopy, enb.  The goals risks and alternatives of the planned surgical procedure Procedure(s): [place] (N/A)  have been discussed with the patient in detail. The risks of the procedure including death, infection, stroke, myocardial infarction, bleeding, blood transfusion pneumothorax have all been discussed specifically.  I have quoted Barbaraann Boys a 1 % of perioperative mortality and a complication rate as high as 10 %. The patient's questions have been answered.[name] is willing  to proceed with the planned procedure.   [name]      [name],[place] [address] Office [phone]   Beeper [phone]  10/08/2017 10:40 AM

## 2017-10-09 ENCOUNTER — Encounter (HOSPITAL_COMMUNITY): Payer: Self-pay | Admitting: Cardiothoracic Surgery

## 2017-10-09 NOTE — Op Note (Signed)
NAMEFANTA, WIMBERLEY MEDICAL RECORD EK:[id number] ACCOUNT [id number] DATE OF BIRTH:May 21, 1940 FACILITY: [facility] LOCATION: [facility] PHYSICIAN:Dalten Ambrosino Maryruth Bun, MD  OPERATIVE REPORT  DATE OF PROCEDURE:  10/08/2017  PREOPERATIVE DIAGNOSIS:  Left lower and left upper lobe lung nodules.  POSTOPERATIVE DIAGNOSIS:  Left lower and left upper lobe lung nodules.  SURGICAL PROCEDURE:  Video bronchoscopy, electromagnetic navigation bronchoscopy with biopsies of left lower lobe and left upper lobe lung mass.  SURGEON:  Lilia Argue. 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  She has been followed since that time and had progressive  enlargement of a left lower lobe lung mass, approximately 2 cm in size and a 7 mm left upper lobe lung mass.  The left lower lung area had been followed and very slowly enlarged in size and was very mildly metabolic.  We recommended to the patient to  obtain a tissue diagnosis to be more confident that this was not a malignancy.  She agreed and signed informed consent for navigational bronchoscopy with biopsy.  DESCRIPTION OF PROCEDURE:  Preoperatively, based on the Super D CT scan, software development of a target and pathway to the left lower lobe and left upper lobe lesions was plotted out.  This was entered into the Super D consult in the operating room  preoperatively.  Patient underwent general endotracheal anesthesia without incident with a single lumen endotracheal tube.  Appropriate time-out was performed and we proceeded with video bronchoscopy to the subsegmental level on both the left and right  tracheobronchial trees without evidence of endobronchial lesions.  We then registered the Super D system and then used with LG probe in place, we then navigated to within 2 cm of the left lower lobe mass with the Super  D system.  We then reregistered and  the target with fluoroscopy supplementation and again targeted the area within 1 cm.  With a working channel in proper position, we then proceeded with biopsies and brushings of the left lower lobe lesion, both with needle brush, needle aspiration  biopsy forceps and triple brush.  Multiple smears were obtained and biopsy specimens.  Initial smear showed no malignancy and suggested granulomas  We then removed our working channel and renavigated to the left upper lobe lesion and reregistered with  fluoro.  So much of the lower lobe lesions, brushings, triple brushings and biopsies were obtained and specimen submitted to pathology.    The patient tolerated the procedure well.  Fluoroscopy at the end of the procedure showed no evidence of pneumothorax.  The patient was extubated in the operating room and transferred to the recovery room having tolerated the procedure well.  AN/NUANCE  D:10/09/2017 T:10/09/2017 JOB:[id number]

## 2017-10-09 NOTE — Addendum Note (Signed)
Addendum  created 10/09/17 1048 by Josephine Igo, CRNA   Intraprocedure Meds edited

## 2017-10-11 ENCOUNTER — Other Ambulatory Visit: Payer: Self-pay

## 2017-10-11 ENCOUNTER — Encounter: Payer: Self-pay | Admitting: Cardiothoracic Surgery

## 2017-10-11 ENCOUNTER — Ambulatory Visit: Payer: Medicare Other | Admitting: Cardiothoracic Surgery

## 2017-10-11 VITALS — BP 118/78 | HR 83 | Resp 18 | Ht 63.0 in | Wt 148.0 lb

## 2017-10-11 DIAGNOSIS — R911 Solitary pulmonary nodule: Secondary | ICD-10-CM | POA: Diagnosis not present

## 2017-10-11 NOTE — Progress Notes (Signed)
[address]       [place] [address]             [phone]      Adreanne E Whidby [place] Medical Record #[id number] Date of Birth: [birth year]  Referring: Tanda Rockers, MD Primary Care: Cassandria Anger, MD  Chief Complaint:   POST OP FOLLOW UP 10/08/2017 PREOPERATIVE DIAGNOSIS:  Left lower and left upper lobe lung nodules. POSTOPERATIVE DIAGNOSIS:  Left lower and left upper lobe lung nodules. SURGICAL PROCEDURE:  Video bronchoscopy, electromagnetic navigation bronchoscopy with biopsies of left lower lobe and left upper lobe lung mass. SURGEON:  Lilia Argue. Servando Snare, MD  03/02/2015 OPERATIVE REPORT PREOPERATIVE DIAGNOSIS: Left upper lobe lung mass POSTOPERATIVE DIAGNOSIS: Left upper lobe lung mass. 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 PROCEDURE PERFORMED: Video bronchoscopy, left video-assisted thoracoscopy, minithoracotomy, wedge resection of left upper lobe. SURGEON: Lanelle Bal, M.D Previous left VATS lung resection Lung, wedge biopsy/resection, left upper lobe - NECROTIZING GRANULOMA, 1.7 CM. - NO TUMOR SEEN. - SEE COMMENT. Microscopic Comment Sections demonstrate a 1.7 cm necrotizing granuloma. There is associated reactive pneumocyte hyperplasia with reactive atypia but no true tumor is identified. An AFB stain is performed on a representative section which is negative for acid fast bacilli. A GMS and PAS stain are also performed on a representative section which are both negative for fungal organisms. Although specimen stains for microorganisms are negative, stains are not as sensitive as other microbiologic techniques including culture. Please correlate with culture results. (RAH:gt, 03/04/15) [name] Pathologist, Electronic Signature (Case signed 03/04/2015  History of Present Illness:     Patient was  admitted to [facility] long hospital in December 2017 with respiratory problems  , a CT scan of the chest was  done at that time the report notes recurrent malignancy and old stable line, however the patient has no previous history of lung cancer nor lobectomy.  Repeat ct of chest done with enlarging are left upper lobe pleural based, not at area of previous wedge resection (patient did not have lobectomy as some notes indicate, this was interpreted as recurrent malignancy however the patient previous surgery was not related to malignancy, she has no previous history of lung cancer.  Patient returned last week with a follow-up CT scan, to evaluate a approximately 1 cm nodule in the left lower lobe at the diaphragm, and multiple other smaller nodes in the left and right lung.   Since last seen she has had no new symptoms no cough fever chills.  Earlier this week bronchoscopy with electromagnetic navigation bronchoscopy and biopsy of the left upper lobe small lesion and left lower lobe lesion on the diaphragm were biopsied-   She did smoke for 5-7 years but quit at age 12. With her limited smoking history she would not be eligible for lung cancer screening CT program.  Past Medical History:  Diagnosis Date  . Abdominal aortic aneurysm (HCC)    REPAIRED IN 1996 BY [name]  AND HAS RECENTLY BEEN FOLLOWED BY [name]  . 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 Adenomatous colon polyp    tubular  . Anemia   . CAD in native artery 1996, 2002, 2005    Status post CABG x1 with LIMA-LAD for ostial LAD 90% stenosis --> down to 50% in 2002 and 30% in 2005.;  Atretic LIMA; Myoview 06/2010: Fixed anteroseptal,  apical and inferoapical defect with moderate size. Most likely scar. Mild subendocardial ischemia. EF 71% LOW RISK.   [name] Cervical disc disease   . Chronic kidney disease   . COPD (chronic obstructive pulmonary disease) ([place])   . Diverticulosis   . Hyperlipidemia   . Hypertension   . Hypothyroidism (acquired)    hypo  . Myocardial  infarction [facility]) Aug. 2016   TIA  . Polymyalgia rheumatica (HCC)    2011 Dr. Charlestine Night  . Rheumatoid arthritis ([place]) 2011   Dr.Truslow; fracture knees, hands and wrists -   . S/P CABG x 1 1996   CABG--LIMA-LAD for ostial LAD (not felt to be PCI amenable). EF NORMAL then; LIMA now atretic  . Shortness of breath dyspnea    with exertion  . Stroke [facility]) 11-2014   TIA   . Urinary frequency      Social History   Tobacco Use  Smoking Status Former Smoker  . Last attempt to quit: 12/04/1958  . Years since quitting: 61.8  Smokeless Tobacco Never Used    Social History   Substance and Sexual Activity  Alcohol Use No     Allergies  Allergen Reactions  . Aspirin Other (See Comments)    Hurts stomach  . Codeine Nausea And Vomiting  . Nitroglycerin Other (See Comments)    Heart rate drops    Current Outpatient Medications  Medication Sig Dispense Refill  . acetaminophen (TYLENOL) 500 MG tablet Take 500 mg by mouth every 6 (six) hours as needed.    [name] atorvastatin (LIPITOR) 80 MG tablet Take 1 tablet (80 mg total) by mouth daily. 90 tablet 3  . CHLORPHENIRAMINE-ACETAMINOPHEN PO Take 1 tablet by mouth 2 (two) times daily.    . clopidogrel (PLAVIX) 75 MG tablet Take 1 tablet (75 mg total) by mouth daily. 90 tablet 3  . leflunomide (ARAVA) 20 MG tablet Take 20 mg by mouth every other day.     . levothyroxine (SYNTHROID, LEVOTHROID) 88 MCG tablet Take 1 tablet (88 mcg total) by mouth daily before breakfast. 90 tablet 3  . loperamide (IMODIUM A-D) 2 MG tablet Take 2 mg by mouth 4 (four) times daily as needed for diarrhea or loose stools.    [name] losartan (COZAAR) 25 MG tablet Take 1 tablet (25 mg total) by mouth daily. (Patient taking differently: Take 12.5 mg by mouth daily. ) 90 tablet 3  . metoprolol succinate (TOPROL-XL) 25 MG 24 hr tablet Take 1 tablet (25 mg total) by mouth daily. 90 tablet 3  . psyllium (METAMUCIL) 58.6 % packet Take 1 packet by mouth daily.    [name] triamcinolone  (NASACORT ALLERGY 24HR) 55 MCG/ACT AERO nasal inhaler Place 1 spray into the nose daily.      No current facility-administered medications for this visit.    No change in review of systems since last week Review of Systems  Constitutional: Negative.   HENT: Negative.   Eyes: Negative.   Respiratory: Negative.   Cardiovascular: Negative.  Negative for chest pain, palpitations, orthopnea, claudication, leg swelling and PND.  Gastrointestinal: Negative.   Genitourinary: Negative.   Musculoskeletal: Negative.   Skin: Negative.   Neurological: Negative.   Endo/Heme/Allergies: Bruises/bleeds easily.  Psychiatric/Behavioral: Negative.      Physical Exam: BP 118/78 (BP Location: Left Arm, Patient Position: Sitting, Cuff Size: Normal)   Pulse 83   Resp 18   Ht 5\' 3"  (1.6 m)   Wt 148 lb (67.1 kg)   SpO2 96% Comment: RA  BMI 26.22 kg/m  General appearance: alert and cooperative Head: Normocephalic, without obvious abnormality, atraumatic Lymph nodes: Cervical, supraclavicular, and axillary nodes normal. Resp: clear to auscultation bilaterally Cardio: regular rate and rhythm, S1, S2 normal, no murmur, click, rub or gallop GI: soft, non-tender; bowel sounds normal; no masses,  no organomegaly Extremities: extremities normal, atraumatic, no cyanosis or edema and Homans sign is negative, no sign of DVT Neurologic: Grossly normal   Diagnostic Studies & Laboratory data:     Recent Radiology Findings:  Ct Super D Chest Wo Contrast  Result Date: 10/04/2017 CLINICAL DATA:  Lung nodule. EXAM: CT CHEST WITHOUT CONTRAST TECHNIQUE: Multidetector CT imaging of the chest was performed using thin slice collimation for electromagnetic bronchoscopy planning purposes, without intravenous contrast. COMPARISON:  CT chest 06/28/2017. FINDINGS: Cardiovascular: The heart size appears within normal limits. No pericardial effusion. Previous median sternotomy and CABG procedure. Aortic atherosclerosis noted.  Mediastinum/Nodes: Normal appearance of the thyroid gland. The trachea appears patent and is midline. Normal appearance of the esophagus. No enlarged mediastinal or hilar lymph nodes. No axillary or supraclavicular adenopathy. Lungs/Pleura: Postoperative changes involving the left upper lobe are again noted. The index nodule within the left upper lobe measures 7 mm, image 18/8. This is compared with 4 mm previously. The index nodule within the left lower lobe measures 1.2 x 1.2 cm, image 117/8. Previously 1.1 x 0.9 cm. 4 mm right lower lobe lung nodule is noted, image 125/8. Previously this measured the same. Within the medial right lower lobe there is an 8 mm subpleural nodule, image 72/2. Stable from comparison exam. In the right upper lobe there is a small nodule measuring 5 mm, image 43/8. Stable from previous exam. Upper Abdomen: Lobular appearing left kidney noted. The adrenal glands are unremarkable. No acute abnormality noted. Musculoskeletal: Mild degenerative disc disease noted throughout the thoracic spine. No suspicious bone lesions. IMPRESSION: 1. There has been increase in size of left lower lobe lung nodule compared with 06/28/2017. Small left upper lobe lung nodule is also increased in size. 2. Right lower lobe and right upper lobe lung nodules are stable in the interval. 3.  Aortic Atherosclerosis (ICD10-I70.0). Electronically Signed   By: Kerby Moors M.D.   On: 10/04/2017 10:57   I have independently reviewed the above radiology studies  and reviewed the findings with the patient.    CLINICAL DATA:  Subsequent treatment strategy for lung nodule.  EXAM: NUCLEAR MEDICINE PET SKULL BASE TO THIGH  TECHNIQUE: 7.8 mCi F-18 FDG was injected intravenously. Full-ring PET imaging was performed from the skull base to thigh after the radiotracer. CT data was obtained and used for attenuation correction and anatomic localization.  Fasting blood glucose: 100 mg/dl  Mediastinal blood  pool activity: SUV max 3.2  COMPARISON:  CT chest 06/28/2017, 06/08/2016, 04/15/2016, PET 02/10/2015 and CT chest 01/29/2015.  FINDINGS: NECK: No hypermetabolic lymph nodes in the neck.  Incidental CT findings: None.  CHEST: Nodular scarring in the medial left upper lobe does not show abnormal hypermetabolism. A 10 mm round nodule in the inferior left lower lobe has an SUV max 3.4, is stable in size from 06/28/2017 but new from CT chest 06/08/2016. No hypermetabolic mediastinal, hilar or axillary lymph nodes.  Incidental CT findings: Atherosclerotic calcification of the arterial vasculature. Heart is enlarged. No pericardial or pleural effusion. 6 mm apical left upper lobe nodule (series 8, image 11), too small for PET resolution.  ABDOMEN/PELVIS: No abnormal hypermetabolism in the liver, adrenal glands, spleen or pancreas.  No hypermetabolic lymph nodes.  Incidental CT findings: Low-attenuation lesions in the kidneys are difficult to further characterize without post-contrast imaging. Splenectomy.  SKELETON: No abnormal osseous hypermetabolism.  Incidental CT findings: Degenerative changes in the spine.  IMPRESSION: 1. Mildly hypermetabolic left lower lobe nodule, stable in size from 06/28/2017 but new from 06/08/2016. Findings are indeterminate with both malignant and infectious etiologies considered, given the short interval from 06/28/2017. Consider follow-up CT chest in 3-4 weeks in further initial evaluation. 2. 6 mm apical left upper lobe nodule is too small for PET resolution. 3.  Aortic atherosclerosis (ICD10-170.0).   Electronically Signed   By: Lorin Picket M.D.   On: 07/05/2017 09:09   I have independently reviewed the above radiology studies  and reviewed the findings with the patient.   Dg Chest 2 View  Result Date: 04/17/2016 CLINICAL DATA:  Productive cough for the past month. EXAM: CHEST  2 VIEW COMPARISON:  04/15/2016; 07/22/2015;  chest CT - 04/15/2016 FINDINGS: Grossly unchanged cardiac silhouette and mediastinal contours post median sternotomy. Atherosclerotic plaque within the thoracic aorta. The lungs remain hyperexpanded with flattening of the diaphragms and thinning of the biapical pulmonary parenchyma. Known ill-defined soft tissue about the left upper lobe surgical resection site is not well demonstrated on the present examination. No focal airspace opacities. No pleural effusion or pneumothorax. No evidence of edema. IMPRESSION: 1. Hyperexpanded lungs without acute cardiopulmonary disease. Emphysema. (ICD10-J43.9) 2. Previously identified soft tissue about the left upper lobe surgical resection site is suboptimally evaluated on the present examination. Correlation with report from chest CT performed 04/15/2016 is recommended. Electronically Signed   By: Sandi Mariscal M.D.   On: 04/17/2016 15:42     Ct Angio Chest Pe W And/or Wo Contrast  Result Date: 04/15/2016 CLINICAL DATA:  Elevated D-dimer.  Productive cough congestion. EXAM: CT ANGIOGRAPHY CHEST WITH CONTRAST TECHNIQUE: Multidetector CT imaging of the chest was performed using the standard protocol during bolus administration of intravenous contrast. Multiplanar CT image reconstructions and MIPs were obtained to evaluate the vascular anatomy. CONTRAST:  80 cc Isovue 370 IV COMPARISON:  11/23/2015 FINDINGS: Cardiovascular: No filling defects in the pulmonary arteries to suggest pulmonary emboli. Heart is normal size. Prior CABG. Aorta is tortuous with scattered calcifications, non aneurysmal. Mediastinum/Nodes: Mildly enlarged mediastinal lymph nodes. Subcarinal lymph node has a short axis diameter of 13 mm. Other small scattered paratracheal and AP window lymph nodes. No hilar or axillary adenopathy. Lungs/Pleura: Increasing density noted in the medial left upper lobe in the area of prior postoperative change. This soft tissue measures 2.2 x 1.7 cm on image 15. Given the  location in the postoperative bed, this is concerning for tumor recurrence. Nodule in the left lower lobe on image 38 measures 8 mm on image 38, stable. Scarring noted in the lung bases. No pleural effusions. Upper Abdomen: Imaging into the upper abdomen shows no acute findings. Musculoskeletal: Chest wall soft tissues are unremarkable. No acute bony abnormality or focal bone lesion. Review of the MIP images confirms the above findings. IMPRESSION: Enlarging abnormal soft tissue in the left upper lobe at the prior surgical site concerning for recurrent tumor. This measures up to 2.2 cm. This could be further evaluated with PET CT. Stable left lower lobe pulmonary nodule. Borderline and mildly enlarged mediastinal lymph nodes. Electronically Signed   By: Rolm Baptise M.D.   On: 04/15/2016 15:26    Ct Chest Wo Contrast  Result Date: 11/23/2015 CLINICAL DATA:  Followup left lung nodule. Previous  wedge resection revealed necrotizing granuloma. EXAM: CT CHEST WITHOUT CONTRAST TECHNIQUE: Multidetector CT imaging of the chest was performed following the standard protocol without IV contrast. COMPARISON:  01/29/2015 FINDINGS: Cardiovascular: Heart size within normal limits. Aortic atherosclerosis noted. Previous CABG. Mediastinum/Lymph Nodes: No masses or pathologically enlarged lymph nodes identified on this un-enhanced exam. Lungs/Pleura: Postop changes seen in the medial left upper lobe from previous wedge resection. 8 mm mean diameter perifissural nodule nodule is seen in the left midlung on image 66/4. This is new since previous study, but likely represents an intrapulmonary lymph node. Mild centrilobular emphysema noted. Bibasilar scarring and tiny sub-cm posterior right lower lobe pulmonary nodule remains stable. No evidence of acute infiltrate or pleural effusion. Upper abdomen: No acute findings. Musculoskeletal: No chest wall mass or suspicious bone lesions identified. IMPRESSION: Postop changes in left upper  lobe. 8 mm mean diameter perifissural nodule in the left midlung is new, but likely represents an intrapulmonary lymph node. Non-contrast chest CT at 6-12 months is recommended. If the nodule is stable at time of repeat CT, then future CT at 18-24 months (from today's scan) is considered optional for low-risk patients, but is recommended for high-risk patients. This recommendation follows the consensus statement: Guidelines for Management of Incidental Pulmonary Nodules Detected on CT Images:From the Fleischner Society 2017; published online before print (10.1148/radiol.9735329924). Aortic atherosclerosis and emphysema. Electronically Signed   By: Earle Gell M.D.   On: 11/23/2015 15:46    Recent Lab Findings: Lab Results  Component Value Date   WBC 10.2 10/08/2017   HGB 13.1 10/08/2017   HCT 40.7 10/08/2017   PLT 348 10/08/2017   GLUCOSE 93 10/08/2017   CHOL 165 02/27/2017   TRIG 96.0 02/27/2017   HDL 69.80 02/27/2017   LDLDIRECT 162.1 10/28/2009   LDLCALC 76 02/27/2017   ALT 27 10/08/2017   AST 28 10/08/2017   NA 142 10/08/2017   K 4.6 10/08/2017   CL 107 10/08/2017   CREATININE 1.53 (H) 10/08/2017   BUN 33 (H) 10/08/2017   CO2 25 10/08/2017   TSH 4.54 (H) 08/24/2017   INR 0.98 10/08/2017   10/2015   03/2016   PFT's 10 /2016 FEV1 1.58 80% DLCO 16.27 71%  Interpretation: The FEV1 is normal, but the FEV1/FVC ratio and FEF25-75% are reduced. While the TLC, FRC and SVC are within normal limits, the RV is increased. Following administration of bronchodilators, there is a slight response. The reduced diffusing capacity indicates a mild loss of functional alveolar capillary surface. However, the diffusing capacity was not corrected for the patient's hemoglobin. 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   Follow-up CT scan follow-up scans have shown Interval development of 11 mm nodule in left lower lobe  And  new 5 mm nodule in left lung apex  Scan today shows slight enlargement of the nodules over a short interval 3 months, the scan was done as a super D format.  Cytologies and pathology of biopsies both to the left upper lobe and left lower lobe were reviewed with the patient and were negative for malignancy, initial smears suggested granulomas but final report did not mention this.  We will plan serial CT scans to continue to follow this.  I discussed with  the patient that continued follow-up will be needed, as there is  incidence of false negative biopsy results,  she is agreeable with this plan follow-up CT scan in 4 to 5 months.    Grace Isaac MD      [address] [place] [address] Office [phone]   Beeper [phone]  10/11/2017 9:31 AM

## 2017-10-23 ENCOUNTER — Ambulatory Visit
Admission: RE | Admit: 2017-10-23 | Discharge: 2017-10-23 | Disposition: A | Discharge status: Home / Self Care | Payer: Medicare Other | Source: Ambulatory Visit | Attending: Obstetrics | Admitting: Obstetrics

## 2017-10-23 DIAGNOSIS — M8580 Other specified disorders of bone density and structure, unspecified site: Secondary | ICD-10-CM

## 2017-12-03 ENCOUNTER — Ambulatory Visit: Payer: Medicare Other | Admitting: Internal Medicine

## 2017-12-03 ENCOUNTER — Ambulatory Visit (INDEPENDENT_AMBULATORY_CARE_PROVIDER_SITE_OTHER)
Admission: RE | Admit: 2017-12-03 | Discharge: 2017-12-03 | Disposition: A | Discharge status: Home / Self Care | Payer: Medicare Other | Source: Ambulatory Visit | Attending: Internal Medicine | Admitting: Internal Medicine

## 2017-12-03 ENCOUNTER — Encounter: Payer: Self-pay | Admitting: Internal Medicine

## 2017-12-03 ENCOUNTER — Other Ambulatory Visit (INDEPENDENT_AMBULATORY_CARE_PROVIDER_SITE_OTHER): Payer: Medicare Other

## 2017-12-03 VITALS — BP 116/80 | HR 73 | Temp 97.6°F | Ht 63.0 in | Wt 147.4 lb

## 2017-12-03 DIAGNOSIS — R05 Cough: Secondary | ICD-10-CM | POA: Diagnosis not present

## 2017-12-03 DIAGNOSIS — R0609 Other forms of dyspnea: Secondary | ICD-10-CM

## 2017-12-03 DIAGNOSIS — R911 Solitary pulmonary nodule: Secondary | ICD-10-CM

## 2017-12-03 DIAGNOSIS — R058 Other specified cough: Secondary | ICD-10-CM

## 2017-12-03 LAB — CBC WITH DIFFERENTIAL/PLATELET
Basophils Absolute: 0.1 10*3/uL (ref 0.0–0.1)
Basophils Relative: 0.7 % (ref 0.0–3.0)
EOS PCT: 7.3 % — AB (ref 0.0–5.0)
Eosinophils Absolute: 0.6 10*3/uL (ref 0.0–0.7)
HCT: 40.2 % (ref 36.0–46.0)
Hemoglobin: 13.5 g/dL (ref 12.0–15.0)
LYMPHS ABS: 1.2 10*3/uL (ref 0.7–4.0)
Lymphocytes Relative: 14.9 % (ref 12.0–46.0)
MCHC: 33.5 g/dL (ref 30.0–36.0)
MCV: 93.4 fl (ref 78.0–100.0)
MONOS PCT: 15.5 % — AB (ref 3.0–12.0)
Monocytes Absolute: 1.3 10*3/uL — ABNORMAL HIGH (ref 0.1–1.0)
NEUTROS ABS: 5.1 10*3/uL (ref 1.4–7.7)
NEUTROS PCT: 61.6 % (ref 43.0–77.0)
PLATELETS: 353 10*3/uL (ref 150.0–400.0)
RBC: 4.3 Mil/uL (ref 3.87–5.11)
RDW: 14.9 % (ref 11.5–15.5)
WBC: 8.2 10*3/uL (ref 4.0–10.5)

## 2017-12-03 LAB — BASIC METABOLIC PANEL
BUN: 37 mg/dL — ABNORMAL HIGH (ref 6–23)
CHLORIDE: 104 meq/L (ref 96–112)
CO2: 23 mEq/L (ref 19–32)
Calcium: 9.9 mg/dL (ref 8.4–10.5)
Creatinine, Ser: 1.54 mg/dL — ABNORMAL HIGH (ref 0.40–1.20)
GFR: 34.5 mL/min — ABNORMAL LOW (ref >60.00)
Glucose, Bld: 108 mg/dL — ABNORMAL HIGH (ref 70–99)
Potassium: 5.2 mEq/L — ABNORMAL HIGH (ref 3.5–5.1)
Sodium: 136 mEq/L (ref 135–145)

## 2017-12-03 LAB — BRAIN NATRIURETIC PEPTIDE: PRO B NATRI PEPTIDE: 254 pg/mL — AB (ref 0.0–100.0)

## 2017-12-03 LAB — SEDIMENTATION RATE: Sed Rate: 27 mm/hr (ref 0–30)

## 2017-12-03 MED ORDER — FAMOTIDINE 20 MG PO TABS: ORAL_TABLET | ORAL | 11 refills | Status: DC

## 2017-12-03 MED ORDER — PANTOPRAZOLE SODIUM 40 MG PO TBEC: 40.0000 mg | DELAYED_RELEASE_TABLET | Freq: Every day | ORAL | 2 refills | Status: DC

## 2017-12-03 MED ORDER — PREDNISONE 10 MG PO TABS: ORAL_TABLET | ORAL | 0 refills | Status: DC

## 2017-12-03 NOTE — Progress Notes (Signed)
Subjective:   Patient ID: [name], female    DOB: 1940/05/16     MRN: [id number]     Brief patient profile:  54   yowf  Only smoked age 77 - 36  s obvious sequelae with new cough while on a cruise in September 2016 in [place] > cxr > ct chest > PET > referred to pulmonary clinic 02/15/2015 by Dr  Philis Fendt [facility] with GOLD I copd 02/19/15     History of Present Illness  02/15/2015 1st [facility] office visit/ Wert   Chief Complaint  Patient presents with  . PULMONARY CONSULT    Pulmonary nodules. Pt reports some dyspnea with exertion, some morning dry to wet cough. Pt does not use any inhalers.   Cough better but some cc am congestion x years ? Worse in winter > total ? Usually swallows so can't say color or amt assoc with mild pnds  Doe only with exertion = MMRC1 = can walk nl pace, flat grade, can't hurry or go uphills or steps s mild sob  rec Please remember to go to the lab  department downstairs for your tests - we will call you with the results when they are available. 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 Follow-up    Coughing more and having more PND. Cough is non prod. She is not using her albuterol inhaler.   after stirs has  lots of nonproductive coughing assoc with sense of pnds  no better on anoro,  Not limited by breathing from desired activities   rec Stop anoro and montelukast to see what difference this makes For drainage / throat tickle try take CHLORPHENIRAMINE  4 mg - take one every 4 hours as needed -  available over the counter-   - late add:  pred x 6 days prn flare x one - consider adding gabapentin next ov    09/26/2016  f/u ov/Wert re: GOLD I copd [name] / no worse breathing or coughing off anoro/montelukast but maint on nasacort  Chief Complaint  Patient presents with  . Follow-up    Breathing is doing well and she is not coughing much. No new co's.  doe = MMRC1 = can walk nl pace, flat grade, can't hurry or go uphills or steps s sob   Only a little drainage p one chlorpheniramine in am and double strength at hs and sleep fine now rec Continue protonix and pepcid for another 2 months then fine to try to taper  off by stopping the protonix first and changing the pepcid 20 mg twice daily then taper the pepcid off as well and see if cough flares or not - if does flare, restart and you may need GI evaluation  For drainage / throat tickle try take CHLORPHENIRAMINE  4 mg - take one every 4 hours as needed - available over the counter- may cause drowsiness so start with just a bedtime dose or two and see  how you tolerate it before trying in daytime   If not satisfied we start you on gabapentin 100 mg three times daily     10/08/17  Excisional bx LUL c/w nec granuloma      12/03/2017 acute extended ov/Wert re: re-establish  sob gradually downhill x one year then much worse since June 2019 Chief Complaint  Patient presents with  . Acute Visit    Increased cough since June- prod but unsure sputum color. She states her breathing has been gradually worse since her last visit May 2019.   still using chlorpheniramine  And still sleeping fine but wakes up feeling very congested at usual hour and then all during the day has same sensation but not coughing up excess/purulent mucus Doe gradually worse x one year  = MMRC2 = can't walk a nl pace on a flat grade s sob but does fine slow and flat   Arthritis good control on arava    No obvious day to day or daytime variability or assoc   mucus plugs  or hemoptysis or cp or chest tightness, subjective wheeze or overt sinus or hb symptoms.   Sleeping fine flat  without nocturnal   exacerbation  of respiratory  c/o's or need for noct saba. Also denies any obvious fluctuation of symptoms with weather or environmental changes or other aggravating or alleviating factors except as outlined above   No unusual exposure hx or h/o childhood pna/ asthma or knowledge of premature birth.  Current Allergies, Complete Past Medical History, Past Surgical History, Family History, and Social History were reviewed in [employer] record.  ROS  The following are not active complaints unless bolded Hoarseness, sore throat, dysphagia, dental problems, itching, sneezing,  nasal congestion or discharge of excess mucus or purulent secretions, ear ache,   fever, chills, sweats, unintended wt loss or wt gain, classically pleuritic or exertional cp,  orthopnea pnd or arm/hand swelling  or leg swelling, presyncope, palpitations, abdominal pain, anorexia, nausea, vomiting, diarrhea  or change in bowel habits or change in bladder habits, change in stools or change in urine, dysuria, hematuria,  rash, arthralgias, visual complaints, headache, numbness, weakness or ataxia or problems with walking or coordination,  change in mood or  memory.        Current Meds  Medication Sig  . acetaminophen (TYLENOL) 500 MG tablet Take 500 mg by mouth every 6 (six) hours as needed.  [name] atorvastatin (LIPITOR) 80 MG tablet Take 1 tablet (80 mg total) by mouth daily.  . CHLORPHENIRAMINE-ACETAMINOPHEN PO Take 1 tablet by mouth 2 (two) times daily.  . clopidogrel (PLAVIX) 75 MG tablet Take 1 tablet (75 mg total) by mouth daily.  [name] leflunomide (ARAVA) 20 MG tablet Take 20 mg by mouth every other day.   . levothyroxine (SYNTHROID, LEVOTHROID) 88 MCG tablet Take 1 tablet (88 mcg total) by mouth daily before breakfast.  . loperamide (IMODIUM A-D) 2 MG tablet Take 2 mg by mouth 4  (four) times daily as needed for diarrhea or loose stools.  [name] losartan (COZAAR) 25 MG tablet Take 1 tablet (25 mg total) by mouth daily. (Patient taking differently: Take 12.5 mg by mouth daily. )  . metoprolol succinate (TOPROL-XL) 25 MG 24 hr tablet Take 1 tablet (25 mg total) by mouth daily.  . psyllium (METAMUCIL) 58.6 % packet Take 1 packet by mouth daily.  [name] triamcinolone (NASACORT ALLERGY 24HR) 55 MCG/ACT AERO nasal inhaler Place 1 spray into the nose daily.  Objective:   Physical Exam  amb wf nad   12/03/2017          147  09/26/2016        141  08/07/2016          146  06/19/2016        152  05/22/2016        148   02/15/15 155 lb 6.4 oz (70.489 kg)  01/29/15 154 lb (69.854 kg)  11/26/14 153 lb 8 oz (69.627 kg)    Vital signs reviewed - Note on arrival 02 sats  94% on RA     HEENT: nl dentition, turbinates bilaterally, and oropharynx. Nl external ear canals without cough reflex   NECK :  without JVD/Nodes/TM/ nl carotid upstrokes bilaterally   LUNGS: no acc muscle use,  Nl contour chest which is clear to A and P bilaterally without cough on insp or exp maneuvers   CV:  RRR  no s3 or murmur or increase in P2, and no edema   ABD:  soft and nontender with nl inspiratory excursion in the supine position. No bruits or organomegaly appreciated, bowel sounds nl  MS:  Nl gait/ ext warm without deformities, calf tenderness, cyanosis or clubbing No obvious joint restrictions   SKIN: warm and dry without lesions    NEURO:  alert, approp, nl sensorium with  no motor or cerebellar deficits apparent.      CXR PA and Lateral:   12/03/2017 :    I personally reviewed images and agree with radiology impression as follows:   No active cardiopulmonary disease.   Labs ordered/ reviewed:      Chemistry      Component Value Date/Time   NA 136 12/03/2017 1005   K 5.2 (H) 12/03/2017 1005   CL 104 12/03/2017 1005   CO2 23 12/03/2017 1005   BUN  37 (H) 12/03/2017 1005   CREATININE 1.54 (H) 12/03/2017 1005   CREATININE 1.51 (H) 04/06/2014 1543      Component Value Date/Time   CALCIUM 9.9 12/03/2017 1005   ALKPHOS 99 10/08/2017 0914   AST 28 10/08/2017 0914   ALT 27 10/08/2017 0914   BILITOT 1.0 10/08/2017 0914        Lab Results  Component Value Date   WBC 8.2 12/03/2017   HGB 13.5 12/03/2017   HCT 40.2 12/03/2017   MCV 93.4 12/03/2017   PLT 353.0 12/03/2017       EOS                                                               0.6                                    12/03/2017       Lab Results  Component Value Date   TSH 4.54 (H) 08/24/2017     Lab Results  Component Value Date   PROBNP 254.0 (H) 12/03/2017       Lab Results  Component Value Date   ESRSEDRATE 27 12/03/2017   ESRSEDRATE 20 12/14/2015   ESRSEDRATE 9 09/04/2012          .Labs ordered 12/03/2017   Allergy profile  Assessment & Plan:

## 2017-12-03 NOTE — Patient Instructions (Addendum)
For drainage / throat tickle try take CHLORPHENIRAMINE  4 mg - take one every 4 hours as needed - available over the counter- may cause drowsiness so start with just a bedtime dose or two and see how you tolerate it before trying in daytime    Pantoprazole (protonix) 40 mg   Take  30-60 min before first meal of the day and Pepcid (famotidine)  20 mg one @  bedtime until return to office - this is the best way to tell whether stomach acid is contributing to your problem.    GERD (REFLUX)  is an extremely common cause of respiratory symptoms just like yours , many times with no obvious heartburn at all.    It can be treated with medication, but also with lifestyle changes including elevation of the head of your bed (ideally with 6 inch  bed blocks),  Smoking cessation, avoidance of late meals, excessive alcohol, and avoid fatty foods, chocolate, peppermint, colas, red wine, and acidic juices such as orange juice.  NO MINT OR MENTHOL PRODUCTS SO NO COUGH DROPS   USE SUGARLESS CANDY INSTEAD (Jolley ranchers or Stover's or Life Savers) or even ice chips will also do - the key is to swallow to prevent all throat clearing. NO OIL BASED VITAMINS - use powdered substitutes.     Please remember to go to the lab and x-ray department downstairs in the basement  for your tests - we will call you with the results when they are available.     Please schedule a follow up office visit in 6 weeks, call sooner if needed with pfts on return    Add if cough/sob worse > Prednisone 10 mg take  4 each am x 2 days,   2 each am x 2 days,  1 each am x 2 days and stop

## 2017-12-04 ENCOUNTER — Encounter: Payer: Self-pay | Admitting: Internal Medicine

## 2017-12-04 LAB — RESPIRATORY ALLERGY PROFILE REGION II ~~LOC~~
Allergen, D pternoyssinus,d7: <0.1 kU/L
Allergen, Mouse Urine Protein, e78: <0.1 kU/L
Allergen, Mulberry, t76: <0.1 kU/L
Allergen, P. notatum, m1: <0.1 kU/L
Aspergillus fumigatus, m3: <0.1 kU/L
CLADOSPORIUM HERBARUM (M2) IGE: <0.1 kU/L
CLASS: 0
CLASS: 0
CLASS: 0
CLASS: 0
CLASS: 0
CLASS: 0
CLASS: 0
CLASS: 0
CLASS: 0
CLASS: 0
COMMON RAGWEED (SHORT) (W1) IGE: <0.1 kU/L
Class: 0
Class: 0
Class: 0
Class: 0
Class: 0
Class: 0
Class: 0
Class: 0
Class: 0
Class: 0
Class: 0
Class: 0
Class: 0
Class: 0
Cockroach: <0.1 kU/L
D. farinae: <0.1 kU/L
Dog Dander: <0.1 kU/L
IgE (Immunoglobulin E), Serum: 10 kU/L (ref <=114)
Pecan/Hickory Tree IgE: <0.1 kU/L
Rough Pigweed  IgE: <0.1 kU/L
Timothy Grass: <0.1 kU/L

## 2017-12-04 LAB — INTERPRETATION:

## 2017-12-04 NOTE — Progress Notes (Signed)
Spoke with pt and notified of results per Dr. Wert. Pt verbalized understanding and denied any questions. 

## 2017-12-04 NOTE — Assessment & Plan Note (Signed)
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 This measures up to 2.2 cm.  Stable left lower lobe pulmonary nodule. Borderline and mildly enlarged mediastinal lymph nodes > f/u Dr Servando Snare:    10/08/17  Excisional bx LUL c/w nec granuloma    Although there are clearly abnormalities on CT scan, they should probably be considered "microscopic" since not obvious on plain cxr .     In the setting of obvious "macroscopic" health issues,  I  would be very reluctant to pursue further w/u unless obvious changes on plain cxr or unexplained symptoms > dyspnea is clearly not one of them and is the focus of her present complaints with cough not likely to be related either to the "microscopic" changes in the LUL   Discussed in detail all the  indications, usual  risks and alternatives  relative to the benefits with patient/husband who agree  to proceed with conservative f/u at this point with the above considerations

## 2017-12-04 NOTE — Assessment & Plan Note (Addendum)
Spirometry 02/15/2015   FEV1  1.84 (92%) ratio 77 - 02/15/2015  Walked RA x 3 laps @ 185 ft each stopped due to  End of study, fast  pace, no sob or desat   - 12/03/2017  Walked RA x 3 laps @ 185 ft each stopped due to  End of study, fast pace, no  desat  / min sob     Symptoms are markedly disproportionate to objective findings and not clear to what extent this is actually a pulmonary  problem but pt does appear to have difficult to sort out respiratory symptoms of unknown origin for which  DDX  = almost all start with A and  include Adherence, Ace Inhibitors, Acid Reflux, Active Sinus Disease, Alpha 1 Antitripsin deficiency, Anxiety masquerading as Airways dz,  ABPA,  Allergy(esp in young), Aspiration (esp in elderly), Adverse effects of meds,  Active smokers, A bunch of PE's/clot burden (a few small clots can't cause this syndrome unless there is already severe underlying pulm or vascular dz with poor reserve),  Anemia or thyroid disorder, plus two Bs  = Bronchiectasis and Beta blocker use..and one C= CHF      Labs ok though note tsh and bnp mildly elevated ? 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

## 2017-12-04 NOTE — Assessment & Plan Note (Addendum)
Onset around 2008 - Allergy profile 05/22/2016 >  Eos 0.3 /  IgE  10 neg rast - singulair / gerd rx started 05/22/2016 >>> still with pnds 06/19/2016 > add dymista / 1st gen H1> no better but never took h1 so rec stop dymista  - no better 08/07/2016 on singulair/ anoro > rec stop both and just use prn saba/ h1 > improved 09/26/2016  - restarted gerd rx and 1st gen H1 blockers per guidelines  12/03/2017    Upper airway cough syndrome (previously labeled PNDS),  is so named because it's frequently impossible to sort out how much is  CR/sinusitis with freq throat clearing (which can be related to primary GERD)   vs  causing  secondary (" extra esophageal")  GERD from wide swings in gastric pressure that occur with throat clearing, often  promoting self use of mint and menthol lozenges that reduce the lower esophageal sphincter tone and exacerbate the problem further in a cyclical fashion.   These are the same pts (now being labeled as having "irritable larynx syndrome" by some cough centers) who not infrequently have a history of having failed to tolerate ace inhibitors,  dry powder inhalers or biphosphonates or report having atypical/extraesophageal reflux symptoms that don't respond to standard doses of PPI  and are easily confused as having aecopd or asthma flares by even experienced allergists/ pulmonologists (myself included).    rec resume previous rx then return in 6 weeks to regroup     I had an extended discussion with the patient reviewing all relevant studies completed to date and  lasting 25 minutes of a 40  minute acute office visit to re-establish/ address  non-specific but potentially very serious refractory respiratory symptoms of uncertain and potentially multiple  etiologies.   Each maintenance medication was reviewed in detail including most importantly the difference between maintenance and prns and under what circumstances the prns are to be triggered using an action plan format that is  not reflected in the computer generated alphabetically organized AVS.    Please see AVS for specific instructions unique to this office visit that I personally wrote and verbalized to the the pt in detail and then reviewed with pt  by my nurse highlighting any changes in therapy/plan of care  recommended at today's visit.

## 2017-12-27 ENCOUNTER — Ambulatory Visit: Payer: Medicare Other

## 2018-01-09 NOTE — Progress Notes (Addendum)
Subjective:   [name] is a 77 y.o. female who presents for Medicare Annual (Subsequent) preventive examination.  Review of Systems:  No ROS.  Medicare Wellness Visit. Additional risk factors are reflected in the social history.  Cardiac Risk Factors include: advanced age (>68men, >60 women);dyslipidemia;hypertension Sleep patterns: feels rested on waking, gets up 1-2 times nightly to void and sleeps 7-8 hours nightly.    Home Safety/Smoke Alarms: Feels safe in home. Smoke alarms in place.  Living environment; residence and Firearm Safety: apartment, no firearms. Resides at [facility], Lives with husband, no needs for DME, good support system Seat Belt Safety/Bike Helmet: Wears seat belt.     Objective:     Vitals: BP 122/66   Pulse (!) 103   Resp 18   Ht 5\' 3"  (1.6 m)   Wt 146 lb (66.2 kg)   SpO2 96%   BMI 25.86 kg/m   Body mass index is 25.86 kg/m.  Advanced Directives 01/10/2018 10/08/2017 06/18/2017 12/21/2016 06/28/2016 06/28/2016 06/27/2016  Does Patient Have a Medical Advance Directive? Yes Yes Yes Yes Yes Yes Yes  Type of Paramedic of [place];Living will Living will;Healthcare Power of [place];Living will [place];Living will [place];Living will [place];Living will [place];Living will  Does patient want to make changes to medical advance directive? - - - - No - Patient declined - No - Patient declined  Copy of [place] in Chart? No - copy requested No - copy requested - No - copy requested No - copy requested - -  Pre-existing out of facility DNR order (yellow form or pink MOST form) - - - - - - -    Tobacco Social History   Tobacco Use  Smoking Status Former Smoker  . Last attempt to quit: 12/04/1958  . Years since quitting: 59.1  Smokeless Tobacco Never Used     Counseling given: Not Answered   Past  Medical History:  Diagnosis Date  . Abdominal aortic aneurysm (HCC)    REPAIRED IN 1996 BY [name]  AND HAS RECENTLY BEEN FOLLOWED BY [name]  . 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 Adenomatous colon polyp    tubular  . Anemia   . CAD in native artery 1996, 2002, 2005    Status post CABG x1 with LIMA-LAD for ostial LAD 90% stenosis --> down to 50% in 2002 and 30% in 2005.;  Atretic LIMA; Myoview 06/2010: Fixed anteroseptal, apical and inferoapical defect with moderate size. Most likely scar. Mild subendocardial ischemia. EF 71% LOW RISK.   [name] Cervical disc disease   . Chronic kidney disease   . COPD (chronic obstructive pulmonary disease) ([place])   . Diverticulosis   . Hyperlipidemia   . Hypertension   . Hypothyroidism (acquired)    hypo  . Myocardial infarction [facility]) Aug. 2016   TIA  . Polymyalgia rheumatica (HCC)    2011 Dr. Charlestine Night  . Rheumatoid arthritis ([place]) 2011   Dr.Truslow; fracture knees, hands and wrists -   . S/P CABG x 1 1996   CABG--LIMA-LAD for ostial LAD (not felt to be PCI amenable). EF NORMAL then; LIMA now atretic  . Shortness of breath dyspnea    with exertion  . Stroke [facility]) 11-2014   TIA   . Urinary frequency    Past Surgical  History:  Procedure Laterality Date  . ABDOMINAL AORTIC ANEURYSM REPAIR  9629   Complicated by mesenteric artery stenosis and splenic artery infarction with acquired Asplenia  . APPENDECTOMY    . BUNIONECTOMY  07/2011   right foot  . CARDIAC CATHETERIZATION  2005   (Most recent CATH) - ostial LAD lesion 20-30% (down from 90% initially). Atretic LIMA. Minimal disease the RCA and Circumflex system.  [name] CARPAL TUNNEL RELEASE Left   . CATARACT EXTRACTION Bilateral   . CERVICAL SPINE SURGERY     plate 2008 Dr. Saintclair Halsted  . [place]   INCLUDED AN INTERNAL MAMMARY ARTERY TO THE LAD. EF WAS NORMAL  . INGUINAL HERNIA REPAIR Right   .  LAPAROSCOPIC APPENDECTOMY N/A 06/28/2016   Procedure: APPENDECTOMY LAPAROSCOPIC;  Surgeon: Leighton Ruff, MD;  Location: [facility];  Service: General;  Laterality: N/A;  . NM MYOVIEW LTD  March 2012   Fixed anteroseptal, apical and inferoapical defect with moderate size. Most likely scar. Mild subendocardial ischemia. EF 71% LOW RISK.   [name] SPLENECTOMY    . TRANSTHORACIC ECHOCARDIOGRAM  12/2014   [facility]: Normal LV size & function. EF 55-60%,   . vagina polyp    . VIDEO ASSISTED THORACOSCOPY (VATS)/WEDGE RESECTION Left 03/02/2015   Procedure: VIDEO ASSISTED THORACOSCOPY (VATS), MINI THORACOTOMY, LEFT UPPER LOBE WEDGE, TAKE DOWN OF INTERNAL MAMMARY LESIONS, PLACEMENT OF ON-Q PUMP;  Surgeon: Grace Isaac, MD;  Location: [place];  Service: Thoracic;  Laterality: Left;  [name] VIDEO BRONCHOSCOPY N/A 03/02/2015   Procedure: BRONCHOSCOPY;  Surgeon: Grace Isaac, MD;  Location: [place];  Service: Thoracic;  Laterality: N/A;  . VIDEO BRONCHOSCOPY WITH ENDOBRONCHIAL NAVIGATION N/A 10/08/2017   Procedure: VIDEO BRONCHOSCOPY WITH ENDOBRONCHIAL NAVIGATION WITH BIOPSIES OF LEFT UPPER LOBE AND LEFT LOWER LOBE;  Surgeon: Grace Isaac, MD;  Location: [facility];  Service: Thoracic;  Laterality: N/A;   Family History  Problem Relation Age of Onset  . Hypertension Mother   . Diabetes Mother   . Heart disease Mother   . Hyperlipidemia Mother   . Stroke Father   . Hyperlipidemia Sister   . Hypertension Sister   . Hypertension Daughter   . Cancer Paternal Uncle        Deceased from cancer not sure of site  . Hypertension Son   . Hyperlipidemia Son   . Hyperlipidemia Son   . Hypertension Son   . Hyperlipidemia Son   . Hypertension Son   . Hypertension Unknown   . Coronary artery disease Unknown   . Asthma Neg Hx   . Colon cancer Neg Hx    Social History   Socioeconomic History  . Marital status: Married    Spouse name: Not on file  . Number of children: 4  . Years of education: Not on file   . Highest education level: Not on file  Occupational History  . Occupation: retired    Fish farm manager: RETIRED  Social Needs  . Financial resource strain: Not hard at all  . Food insecurity:    Worry: Never true    Inability: Never true  . Transportation needs:    Medical: No    Non-medical: No  Tobacco Use  . Smoking status: Former Smoker    Last attempt to quit: 12/04/1958    Years since quitting: 59.1  . Smokeless tobacco: Never Used  Substance and Sexual Activity  . Alcohol use: No  . Drug use: No  . Sexual activity:  Not Currently  Lifestyle  . Physical activity:    Days per week: 5 days    Minutes per session: 40 min  . Stress: Not at all  Relationships  . Social connections:    Talks on phone: More than three times a week    Gets together: More than three times a week    Attends religious service: 1 to 4 times per year    Active member of club or organization: Yes    Attends meetings of clubs or organizations: More than 4 times per year    Relationship status: Married  Other Topics Concern  . Not on file  Social History Narrative   [facility]- [facility]  [place] ; [place]. GOES TO [place] 3 DAYS A WEEK FOR ABOUT 45 MIN. PER SESSION      Living at [facility] since dec 2015    Outpatient Encounter Medications as of 01/10/2018  Medication Sig  . acetaminophen (TYLENOL) 500 MG tablet Take 500 mg by mouth every 6 (six) hours as needed.  [name] atorvastatin (LIPITOR) 80 MG tablet Take 1 tablet (80 mg total) by mouth daily.  . CHLORPHENIRAMINE-ACETAMINOPHEN PO Take 1 tablet by mouth 2 (two) times daily.  . clopidogrel (PLAVIX) 75 MG tablet Take 1 tablet (75 mg total) by mouth daily.  . famotidine (PEPCID) 20 MG tablet One at bedtime  . leflunomide (ARAVA) 20 MG tablet Take 20 mg by mouth every other day.   . levothyroxine (SYNTHROID, LEVOTHROID) 88 MCG tablet Take 1 tablet (88 mcg total) by mouth daily before breakfast.  .  loperamide (IMODIUM A-D) 2 MG tablet Take 2 mg by mouth 4 (four) times daily as needed for diarrhea or loose stools.  [name] losartan (COZAAR) 25 MG tablet Take 1 tablet (25 mg total) by mouth daily. (Patient taking differently: Take 12.5 mg by mouth daily. )  . metoprolol succinate (TOPROL-XL) 25 MG 24 hr tablet Take 1 tablet (25 mg total) by mouth daily.  . pantoprazole (PROTONIX) 40 MG tablet Take 1 tablet (40 mg total) by mouth daily. Take 30-60 min before first meal of the day  . predniSONE (DELTASONE) 10 MG tablet Take as directed  . psyllium (METAMUCIL) 58.6 % packet Take 1 packet by mouth daily.  [name] triamcinolone (NASACORT ALLERGY 24HR) 55 MCG/ACT AERO nasal inhaler Place 1 spray into the nose daily.    No facility-administered encounter medications on file as of 01/10/2018.     Activities of Daily Living In your present state of health, do you have any difficulty performing the following activities: 01/10/2018 10/08/2017  Hearing? N Y  Comment - HOH  Vision? N -  Difficulty concentrating or making decisions? N -  Walking or climbing stairs? N N  Dressing or bathing? N -  Doing errands, shopping? N -  Preparing Food and eating ? N -  Using the Toilet? N -  In the past six months, have you accidently leaked urine? N -  Do you have problems with loss of bowel control? N -  Managing your Medications? N -  Managing your Finances? N -  Housekeeping or managing your Housekeeping? N -  Some recent data might be hidden    Patient Care Team: Plotnikov, [name], MD as PCP - General Leonie Man, MD as Consulting Physician (Cardiology) Gavin Pound, MD as Consulting Physician (Rheumatology) Armbruster, Carlota Raspberry, MD as Consulting Physician (Gastroenterology) Tanda Rockers, MD as Consulting Physician (Pulmonary  Disease) Grace Isaac, MD as Consulting Physician (Cardiothoracic Surgery)    Assessment:   This is a routine wellness examination for Annibelle. Physical assessment  deferred to PCP.   Exercise Activities and Dietary recommendations Current Exercise Habits: Home exercise routine;Structured exercise class, Type of exercise: calisthenics;walking(water aerobics), Time (Minutes): 50, Frequency (Times/Week): 5, Weekly Exercise (Minutes/Week): 250, Intensity: Mild, Exercise limited by: orthopedic condition(s) Diet (meal preparation, eat out, water intake, caffeinated beverages, dairy products, fruits and vegetables): in general, a "healthy" diet  , well balanced   Reviewed heart healthy diet. Encouraged patient to increase daily water and healthy fluid intake.  Goals      Patient Stated   . 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 maintian current health status     Continue eat healthy, exercise, enjoy life and family    . Patient Stated     Continue to stay active socially and physically. Enjoy family, travel and life.       Fall Risk Fall Risk  01/10/2018 12/21/2016 08/17/2016 11/26/2014 12/09/2013  Falls in the past year? No No No No No  Risk for fall due to : - - - - Impaired vision  Risk for fall due to: Comment - - - - macular degeneration    Depression Screen PHQ 2/9 Scores 01/10/2018 12/21/2016 08/17/2016 11/26/2014  PHQ - 2 Score 0 0 0 0  PHQ- 9 Score - 2 - -     Cognitive Function MMSE - Mini Mental State Exam 12/21/2016 11/26/2014  Not completed: - Unable to complete  Orientation to time 5 -  Orientation to Place 5 -  Registration 3 -  Attention/ Calculation 5 -  Recall 2 -  Language- name 2 objects 2 -  Language- repeat 1 -  Language- follow 3 step command 3 -  Language- read & follow direction 1 -  Write a sentence 1 -  Copy design 1 -  Total score 29 -       Ad8 score reviewed for issues:  Issues  making decisions: no  Less interest in hobbies / activities: no  Repeats questions, stories (family complaining): no  Trouble using ordinary gadgets (microwave, computer, phone):no  Forgets the month or year: no  Mismanaging finances: no  Remembering appts: no  Daily problems with thinking and/or memory: no Ad8 score is= 0  Immunization History  Administered Date(s) Administered  . Influenza Whole 05/02/2001, 01/27/2008, 01/12/2010, 12/31/2010, 01/25/2012  . Influenza, High Dose Seasonal PF 01/14/2014, 12/21/2016  . Influenza,inj,Quad PF,6+ Mos 12/23/2015  . Influenza-Unspecified 02/03/2013, 01/13/2014, 02/13/2015  . Meningococcal Polysaccharide 03/21/2012  . PPD Test 11/18/2013  . Pneumococcal Conjugate-13 03/20/2013  . Pneumococcal Polysaccharide-23 05/02/2000, 06/23/2010  . Td 05/01/2006  . Tdap 06/01/2016  . Zoster 01/17/2006  . Zoster Recombinat (Shingrix) 05/16/2017, 07/20/2017   Screening Tests Health Maintenance  Topic Date Due  . INFLUENZA VACCINE  11/29/2017  . COLONOSCOPY  03/30/2019  . TETANUS/TDAP  06/01/2026  . DEXA SCAN  Completed  . PNA vac Low Risk Adult  Completed      Plan:     Continue doing brain stimulating activities (puzzles, reading, adult coloring books, staying active) to keep memory sharp.   Continue to  eat heart healthy diet (full of fruits, vegetables, whole grains, lean protein, water--limit salt, fat, and sugar intake) and increase physical activity as tolerated.  I have personally reviewed and noted the following in the patient's chart:   . Medical and social history . Use of alcohol, tobacco or illicit drugs  . Current medications and supplements . Functional ability and status . Nutritional status . Physical activity . Advanced directives . List of other physicians . Vitals . Screenings to include cognitive, depression, and falls . Referrals and appointments  In addition, I have reviewed and discussed with patient  certain preventive protocols, quality metrics, and best practice recommendations. A written personalized care plan for preventive services as well as general preventive health recommendations were provided to patient.     Michiel Cowboy, RN  01/10/2018    Medical screening examination/treatment/procedure(s) were performed by non-physician practitioner and as supervising physician I was immediately available for consultation/collaboration. I agree with above. Lew Dawes, MD

## 2018-01-10 ENCOUNTER — Ambulatory Visit (INDEPENDENT_AMBULATORY_CARE_PROVIDER_SITE_OTHER): Payer: Medicare Other | Admitting: *Deleted

## 2018-01-10 VITALS — BP 122/66 | HR 103 | Resp 18 | Ht 63.0 in | Wt 146.0 lb

## 2018-01-10 DIAGNOSIS — Z0000 Encounter for general adult medical examination without abnormal findings: Secondary | ICD-10-CM

## 2018-01-10 NOTE — Patient Instructions (Signed)
Continue doing brain stimulating activities (puzzles, reading, adult coloring books, staying active) to keep memory sharp.   Continue to eat heart healthy diet (full of fruits, vegetables, whole grains, lean protein, water--limit salt, fat, and sugar intake) and increase physical activity as tolerated.   [name] , Thank you for taking time to come for your Medicare Wellness Visit. I appreciate your ongoing commitment to your health goals. Please review the following plan we discussed and let me know if I can assist you in the future.   These are the goals we discussed: Goals      Patient Stated   . 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 maintian current health status     Continue eat healthy, exercise, enjoy life and family    . Patient Stated     Continue to stay active socially and physically. Enjoy family, travel and life.       This is a list of the screening recommended for you and due dates:  Health Maintenance  Topic Date Due  . Flu Shot  11/29/2017  . Colon Cancer Screening  03/30/2019  . Tetanus Vaccine  06/01/2026  . DEXA scan (bone density measurement)  Completed  . Pneumonia vaccines  Completed   Health Maintenance, Female Adopting a healthy lifestyle and getting preventive care can go a long way to promote health and wellness. Talk with your health care provider about what schedule of regular examinations is right for you. This is a good chance for you to check in with your provider about disease prevention and staying healthy. In between checkups, there are plenty of things you can do on your own. Experts have done a lot of research about which lifestyle changes and preventive measures are most likely to keep you healthy. Ask your health  care provider for more information. Weight and diet Eat a healthy diet  Be sure to include plenty of vegetables, fruits, low-fat dairy products, and lean protein.  Do not eat a lot of foods high in solid fats, added sugars, or salt.  Get regular exercise. This is one of the most important things you can do for your health. ? Most adults should exercise for at least 150 minutes each week. The exercise should increase your heart rate and make you sweat (moderate-intensity exercise). ? Most adults should also do strengthening exercises at least twice a week. This is in addition to the moderate-intensity exercise.  Maintain a healthy weight  Body mass index (BMI) is a measurement that can be used to identify possible weight problems. It estimates body fat based on height and weight. Your health care provider can help determine your BMI and help you achieve or maintain a healthy weight.  For females 39 years of age and older: ? A BMI below 18.5 is considered underweight. ? A BMI of 18.5 to 24.9 is normal. ? A BMI of 25 to 29.9 is considered overweight. ? A BMI of 30 and above is considered obese.  Watch levels of cholesterol and blood lipids  You should start having your blood tested for lipids and cholesterol at 79 years of age, then have this test every 5 years.  You may need to have  your cholesterol levels checked more often if: ? Your lipid or cholesterol levels are high. ? You are older than 79 years of age. ? You are at high risk for heart disease.  Cancer screening Lung Cancer  Lung cancer screening is recommended for adults 47-31 years old who are at high risk for lung cancer because of a history of smoking.  A yearly low-dose CT scan of the lungs is recommended for people who: ? Currently smoke. ? Have quit within the past 15 years. ? Have at least a 30-pack-year history of smoking. A pack year is smoking an average of one pack of cigarettes a day for 1 year.  Yearly  screening should continue until it has been 15 years since you quit.  Yearly screening should stop if you develop a health problem that would prevent you from having lung cancer treatment.  Breast Cancer  Practice breast self-awareness. This means understanding how your breasts normally appear and feel.  It also means doing regular breast self-exams. Let your health care provider know about any changes, no matter how small.  If you are in your 20s or 30s, you should have a clinical breast exam (CBE) by a health care provider every 1-3 years as part of a regular health exam.  If you are 56 or older, have a CBE every year. Also consider having a breast X-ray (mammogram) every year.  If you have a family history of breast cancer, talk to your health care provider about genetic screening.  If you are at high risk for breast cancer, talk to your health care provider about having an MRI and a mammogram every year.  Breast cancer gene (BRCA) assessment is recommended for women who have family members with BRCA-related cancers. BRCA-related cancers include: ? Breast. ? Ovarian. ? Tubal. ? Peritoneal cancers.  Results of the assessment will determine the need for genetic counseling and BRCA1 and BRCA2 testing.  Cervical Cancer Your health care provider may recommend that you be screened regularly for cancer of the pelvic organs (ovaries, uterus, and vagina). This screening involves a pelvic examination, including checking for microscopic changes to the surface of your cervix (Pap test). You may be encouraged to have this screening done every 3 years, beginning at age 10.  For women ages 59-65, health care providers may recommend pelvic exams and Pap testing every 3 years, or they may recommend the Pap and pelvic exam, combined with testing for human papilloma virus (HPV), every 5 years. Some types of HPV increase your risk of cervical cancer. Testing for HPV may also be done on women of any age  with unclear Pap test results.  Other health care providers may not recommend any screening for nonpregnant women who are considered low risk for pelvic cancer and who do not have symptoms. Ask your health care provider if a screening pelvic exam is right for you.  If you have had past treatment for cervical cancer or a condition that could lead to cancer, you need Pap tests and screening for cancer for at least 20 years after your treatment. If Pap tests have been discontinued, your risk factors (such as having a new sexual partner) need to be reassessed to determine if screening should resume. Some women have medical problems that increase the chance of getting cervical cancer. In these cases, your health care provider may recommend more frequent screening and Pap tests.  Colorectal Cancer  This type of cancer can be detected and often prevented.  Routine  colorectal cancer screening usually begins at 79 years of age and continues through 79 years of age.  Your health care provider may recommend screening at an earlier age if you have risk factors for colon cancer.  Your health care provider may also recommend using home test kits to check for hidden blood in the stool.  A small camera at the end of a tube can be used to examine your colon directly (sigmoidoscopy or colonoscopy). This is done to check for the earliest forms of colorectal cancer.  Routine screening usually begins at age 60.  Direct examination of the colon should be repeated every 5-10 years through 79 years of age. However, you may need to be screened more often if early forms of precancerous polyps or small growths are found.  Skin Cancer  Check your skin from head to toe regularly.  Tell your health care provider about any new moles or changes in moles, especially if there is a change in a mole's shape or color.  Also tell your health care provider if you have a mole that is larger than the size of a pencil  eraser.  Always use sunscreen. Apply sunscreen liberally and repeatedly throughout the day.  Protect yourself by wearing long sleeves, pants, a wide-brimmed hat, and sunglasses whenever you are outside.  Heart disease, diabetes, and high blood pressure  High blood pressure causes heart disease and increases the risk of stroke. High blood pressure is more likely to develop in: ? People who have blood pressure in the high end of the normal range (130-139/85-89 mm Hg). ? People who are overweight or obese. ? People who are African American.  If you are 28-67 years of age, have your blood pressure checked every 3-5 years. If you are 55 years of age or older, have your blood pressure checked every year. You should have your blood pressure measured twice-once when you are at a hospital or clinic, and once when you are not at a hospital or clinic. Record the average of the two measurements. To check your blood pressure when you are not at a hospital or clinic, you can use: ? An automated blood pressure machine at a pharmacy. ? A home blood pressure monitor.  If you are between 14 years and 38 years old, ask your health care provider if you should take aspirin to prevent strokes.  Have regular diabetes screenings. This involves taking a blood sample to check your fasting blood sugar level. ? If you are at a normal weight and have a low risk for diabetes, have this test once every three years after 79 years of age. ? If you are overweight and have a high risk for diabetes, consider being tested at a younger age or more often. Preventing infection Hepatitis B  If you have a higher risk for hepatitis B, you should be screened for this virus. You are considered at high risk for hepatitis B if: ? You were born in a country where hepatitis B is common. Ask your health care provider which countries are considered high risk. ? Your parents were born in a high-risk country, and you have not been immunized  against hepatitis B (hepatitis B vaccine). ? You have HIV or AIDS. ? You use needles to inject street drugs. ? You live with someone who has hepatitis B. ? You have had sex with someone who has hepatitis B. ? You get hemodialysis treatment. ? You take certain medicines for conditions, including cancer, organ transplantation,  and autoimmune conditions.  Hepatitis C  Blood testing is recommended for: ? Everyone born from [birth year] through 1967. ? Anyone with known risk factors for hepatitis C.  Sexually transmitted infections (STIs)  You should be screened for sexually transmitted infections (STIs) including gonorrhea and chlamydia if: ? You are sexually active and are younger than 79 years of age. ? You are older than 79 years of age and your health care provider tells you that you are at risk for this type of infection. ? Your sexual activity has changed since you were last screened and you are at an increased risk for chlamydia or gonorrhea. Ask your health care provider if you are at risk.  If you do not have HIV, but are at risk, it may be recommended that you take a prescription medicine daily to prevent HIV infection. This is called pre-exposure prophylaxis (PrEP). You are considered at risk if: ? You are sexually active and do not regularly use condoms or know the HIV status of your partner(s). ? You take drugs by injection. ? You are sexually active with a partner who has HIV.  Talk with your health care provider about whether you are at high risk of being infected with HIV. If you choose to begin PrEP, you should first be tested for HIV. You should then be tested every 3 months for as long as you are taking PrEP. Pregnancy  If you are premenopausal and you may become pregnant, ask your health care provider about preconception counseling.  If you may become pregnant, take 400 to 800 micrograms (mcg) of folic acid every day.  If you want to prevent pregnancy, talk to your health  care provider about birth control (contraception). Osteoporosis and menopause  Osteoporosis is a disease in which the bones lose minerals and strength with aging. This can result in serious bone fractures. Your risk for osteoporosis can be identified using a bone density scan.  If you are 32 years of age or older, or if you are at risk for osteoporosis and fractures, ask your health care provider if you should be screened.  Ask your health care provider whether you should take a calcium or vitamin D supplement to lower your risk for osteoporosis.  Menopause may have certain physical symptoms and risks.  Hormone replacement therapy may reduce some of these symptoms and risks. Talk to your health care provider about whether hormone replacement therapy is right for you. Follow these instructions at home:  Schedule regular health, dental, and eye exams.  Stay current with your immunizations.  Do not use any tobacco products including cigarettes, chewing tobacco, or electronic cigarettes.  If you are pregnant, do not drink alcohol.  If you are breastfeeding, limit how much and how often you drink alcohol.  Limit alcohol intake to no more than 1 drink per day for nonpregnant women. One drink equals 12 ounces of beer, 5 ounces of , or 1 ounces of hard liquor.  Do not use street drugs.  Do not share needles.  Ask your health care provider for help if you need support or information about quitting drugs.  Tell your health care provider if you often feel depressed.  Tell your health care provider if you have ever been abused or do not feel safe at home. This information is not intended to replace advice given to you by your health care provider. Make sure you discuss any questions you have with your health care provider. Document Released: 10/31/2010 Document Revised:  09/23/2015 Document Reviewed: 01/19/2015 Elsevier Interactive Patient Education  [employer].

## 2018-01-11 ENCOUNTER — Other Ambulatory Visit: Payer: Self-pay | Admitting: Internal Medicine

## 2018-01-11 DIAGNOSIS — J449 Chronic obstructive pulmonary disease, unspecified: Secondary | ICD-10-CM

## 2018-01-11 DIAGNOSIS — R0609 Other forms of dyspnea: Secondary | ICD-10-CM

## 2018-01-14 ENCOUNTER — Ambulatory Visit (INDEPENDENT_AMBULATORY_CARE_PROVIDER_SITE_OTHER): Payer: Medicare Other | Admitting: Internal Medicine

## 2018-01-14 ENCOUNTER — Encounter: Payer: Self-pay | Admitting: Internal Medicine

## 2018-01-14 VITALS — BP 112/70 | HR 90 | Ht 63.0 in | Wt 144.0 lb

## 2018-01-14 DIAGNOSIS — J449 Chronic obstructive pulmonary disease, unspecified: Secondary | ICD-10-CM

## 2018-01-14 DIAGNOSIS — R05 Cough: Secondary | ICD-10-CM

## 2018-01-14 DIAGNOSIS — R058 Other specified cough: Secondary | ICD-10-CM

## 2018-01-14 LAB — PULMONARY FUNCTION TEST
DL/VA % pred: 90 %
DL/VA: 4.21 ml/min/mmHg/L
DLCO unc % pred: 84 %
DLCO unc: 19.35 ml/min/mmHg
FEF 25-75 Post: 1.17 L/sec
FEF 25-75 Pre: 0.68 L/sec
FEF2575-%CHANGE-POST: 72 %
FEF2575-%PRED-POST: 84 %
FEF2575-%Pred-Pre: 48 %
FEV1-%CHANGE-POST: 18 %
FEV1-%Pred-Post: 90 %
FEV1-%Pred-Pre: 76 %
FEV1-POST: 1.68 L
FEV1-Pre: 1.42 L
FEV1FVC-%CHANGE-POST: 10 %
FEV1FVC-%Pred-Pre: 83 %
FEV6-%Change-Post: 9 %
FEV6-%Pred-Post: 103 %
FEV6-%Pred-Pre: 94 %
FEV6-PRE: 2.24 L
FEV6-Post: 2.45 L
FEV6FVC-%Change-Post: 1 %
FEV6FVC-%PRED-PRE: 104 %
FEV6FVC-%Pred-Post: 105 %
FVC-%CHANGE-POST: 7 %
FVC-%PRED-POST: 98 %
FVC-%PRED-PRE: 91 %
FVC-POST: 2.47 L
FVC-PRE: 2.29 L
POST FEV1/FVC RATIO: 68 %
Post FEV6/FVC ratio: 99 %
Pre FEV1/FVC ratio: 62 %
Pre FEV6/FVC Ratio: 98 %
RV % PRED: 136 %
RV: 3.18 L
TLC % pred: 111 %
TLC: 5.45 L

## 2018-01-14 MED ORDER — FLUTICASONE FUROATE-VILANTEROL 100-25 MCG/INH IN AEPB: 1.0000 | INHALATION_SPRAY | Freq: Every day | RESPIRATORY_TRACT | 0 refills | Status: DC

## 2018-01-14 MED ORDER — FLUTICASONE FUROATE-VILANTEROL 100-25 MCG/INH IN AEPB: 1.0000 | INHALATION_SPRAY | Freq: Every day | RESPIRATORY_TRACT | 3 refills | Status: DC

## 2018-01-14 NOTE — Patient Instructions (Signed)
Start BREO 270 one click each am x 2 drags each am   Try off fosfamax for now    Please schedule a follow up office visit in 6 weeks, call sooner if needed

## 2018-01-14 NOTE — Progress Notes (Signed)
PFT done today. 

## 2018-01-14 NOTE — Progress Notes (Signed)
Subjective:   Patient ID: [name], female    DOB: Sep 04, 1940     MRN: [id number]     Brief patient profile:  82   yowf  Only smoked age 77 - 44  s obvious sequelae with new cough while on a cruise in September 2016 in [place] > cxr > ct chest > PET > referred to pulmonary clinic 02/15/2015 by Dr  Philis Fendt [facility] with GOLD I copd 02/19/15     History of Present Illness  02/15/2015 1st [facility] office visit/ W[facility]   Chief Complaint  Patient presents with  . PULMONARY CONSULT    Pulmonary nodules. Pt reports some dyspnea with exertion, some morning dry to wet cough. Pt does not use any inhalers.   Cough better but some cc am congestion x years ? Worse in winter > total ? Usually swallows so can't say color or amt assoc with mild pnds  Doe only with exertion = MMRC1 = can walk nl pace, flat grade, can't hurry or go uphills or steps s mild sob  rec Please remember to go to the lab  department downstairs for your tests - we will call you with the results when they are available. 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 Follow-up    Coughing more and having more PND. Cough is non prod. She is not using her albuterol inhaler.   after stirs has  lots of nonproductive coughing assoc with sense of pnds  no better on anoro,  Not limited by breathing from desired activities   rec Stop anoro and montelukast to see what difference this makes For drainage / throat tickle try take CHLORPHENIRAMINE  4 mg - take one every 4 hours as needed -  available over the counter-   - late add:  pred x 6 days prn flare x one - consider adding gabapentin next ov    09/26/2016  f/u ov/Wert re: GOLD I copd [name] / no worse breathing or coughing off anoro/montelukast but maint on nasacort  Chief Complaint  Patient presents with  . Follow-up    Breathing is doing well and she is not coughing much. No new co's.  doe = MMRC1 = can walk nl pace, flat grade, can't hurry or go uphills or steps s sob   Only a little drainage p one chlorpheniramine in am and double strength at hs and sleep fine now rec Continue protonix and pepcid for another 2 months then fine to try to taper  off by stopping the protonix first and changing the pepcid 20 mg twice daily then taper the pepcid off as well and see if cough flares or not - if does flare, restart and you may need GI evaluation  For drainage / throat tickle try take CHLORPHENIRAMINE  4 mg - take one every 4 hours as needed - available over the counter- may cause drowsiness so start with just a bedtime dose or two and see  how you tolerate it before trying in daytime   If not satisfied we start you on gabapentin 100 mg three times daily     10/08/17  Excisional bx LUL c/w nec granuloma      12/03/2017 acute extended ov/Wert re: re-establish  sob gradually downhill x one year then much worse since June 2019 Chief Complaint  Patient presents with  . Acute Visit    Increased cough since June- prod but unsure sputum color. She states her breathing has been gradually worse since her last visit May 2019.   still using chlorpheniramine  And still sleeping fine but wakes up feeling very congested at usual hour and then all during the day has same sensation but not coughing up excess/purulent mucus Doe gradually worse x one year  = MMRC2 = can't walk a nl pace on a flat grade s sob but does fine slow and flat  Arthritis good control on arava  rec For drainage / throat tickle try take CHLORPHENIRAMINE  4 mg - take  one every 4 hours as needed - available over the counter- may cause drowsiness so start with just a bedtime dose or two and see how you tolerate it before trying in daytime   Pantoprazole (protonix) 40 mg   Take  30-60 min before first meal of the day and Pepcid (famotidine)  20 mg one @  bedtime until return to office - this is the best way to tell whether stomach acid is contributing to your problem.   GERD diet  Pl    Add if cough/sob worse > Prednisone 10 mg take  4 each am x 2 days,   2 each am x 2 days,  1 each am x 2 days and stop       01/14/2018  f/u ov/Wert re:  GOLD I copd with reversible component . Did not take the prednisone  Chief Complaint  Patient presents with  . Follow-up    PFT's done today. Her cough and SOB are about the same. No new co's.   Dyspnea:  MMRC1 = can walk nl pace, flat grade, can't hurry or go uphills or steps s sob   Cough: more of throat clearing/ daytime x years / p  rx fosfamax >>  worse since then  Sleeping: flat bed / one pillow SABA use: none 02: none    No obvious day to day or daytime variability or assoc excess/ purulent sputum or mucus plugs or hemoptysis or cp or chest tightness, subjective wheeze or overt sinus or hb symptoms.   sleeping as above  without nocturnal  or early am exacerbation  of respiratory  c/o's or need for noct saba. Also denies any obvious fluctuation of symptoms with weather or environmental changes or other aggravating or alleviating factors except as outlined above   No unusual exposure hx or h/o childhood pna/ asthma or knowledge of premature birth.  Current Allergies, Complete Past Medical History, Past Surgical History, Family History, and Social History were reviewed in [employer] record.  ROS  The following are not active complaints unless bolded Hoarseness, sore throat, dysphagia, dental problems, itching, sneezing,  nasal congestion or discharge of excess mucus or purulent secretions,  ear ache,   fever, chills, sweats, unintended wt loss or wt gain, classically pleuritic or exertional cp,  orthopnea pnd or arm/hand swelling  or leg swelling, presyncope, palpitations, abdominal pain, anorexia, nausea, vomiting, diarrhea  or change in bowel habits or change in bladder  habits, change in stools or change in urine, dysuria, hematuria,  rash, arthralgias, visual complaints, headache, numbness, weakness or ataxia or problems with walking or coordination,  change in mood or  memory.        Current Meds  Medication Sig  . acetaminophen (TYLENOL) 500 MG tablet Take 500 mg by mouth every 6 (six) hours as needed.  [name] alendronate (FOSAMAX) 70 MG tablet Take 1 tablet by mouth every 7 (seven) days.  [name] atorvastatin (LIPITOR) 80 MG tablet Take 1 tablet (80 mg total) by mouth daily.  . CHLORPHENIRAMINE-ACETAMINOPHEN PO Take 1 tablet by mouth 2 (two) times daily.  . clopidogrel (PLAVIX) 75 MG tablet Take 1 tablet (75 mg total) by mouth daily.  . famotidine (PEPCID) 20 MG tablet One at bedtime  . leflunomide (ARAVA) 20 MG tablet Take 20 mg by mouth every other day.   . levothyroxine (SYNTHROID, LEVOTHROID) 88 MCG tablet Take 1 tablet (88 mcg total) by mouth daily before breakfast.  . loperamide (IMODIUM A-D) 2 MG tablet Take 2 mg by mouth 4 (four) times daily as needed for diarrhea or loose stools.  [name] losartan (COZAAR) 25 MG tablet Take 1 tablet (25 mg total) by mouth daily. (Patient taking differently: Take 12.5 mg by mouth daily. )  . metoprolol succinate (TOPROL-XL) 25 MG 24 hr tablet Take 1 tablet (25 mg total) by mouth daily.  . pantoprazole (PROTONIX) 40 MG tablet Take 1 tablet (40 mg total) by mouth daily. Take 30-60 min before first meal of the day  . predniSONE (DELTASONE) 10 MG tablet Take as directed  . psyllium (METAMUCIL) 58.6 % packet Take 1 packet by mouth daily.  [name] triamcinolone (NASACORT ALLERGY 24HR) 55 MCG/ACT AERO nasal inhaler Place 1 spray into the nose daily.                               Objective:   Physical Exam    amb wf nad freq throat clearing   01/14/2018        144  12/03/2017          147  09/26/2016        141  08/07/2016          146  06/19/2016        152  05/22/2016        148   02/15/15 155 lb 6.4 oz (70.489 kg)  01/29/15 154 lb (69.854 kg)  11/26/14 153 lb 8 oz (69.627 kg)    Vital signs reviewed - Note on arrival 02 sats  94% on RA     HEENT: nl dentition, turbinates bilaterally, and oropharynx which is pristine. Nl external ear canals without cough reflex   NECK :  without JVD/Nodes/TM/ nl carotid upstrokes bilaterally   LUNGS: no acc muscle use,  Nl contour chest which is clear to A and P bilaterally without cough on insp or exp maneuvers   CV:  RRR  no s3 or murmur or increase in P2, and no edema   ABD:  soft and nontender with nl inspiratory excursion in the supine position. No bruits or organomegaly appreciated, bowel sounds nl  MS:  Nl gait/ ext warm without deformities, calf tenderness, cyanosis or clubbing No obvious joint restrictions   SKIN: warm and dry without lesions    NEURO:  alert, approp, nl sensorium with  no motor or cerebellar deficits apparent.  Assessment & Plan:

## 2018-01-15 ENCOUNTER — Encounter: Payer: Self-pay | Admitting: Internal Medicine

## 2018-01-15 NOTE — Assessment & Plan Note (Signed)
Quit smoking in her 76s - PFT's  02/19/15   FEV1 1.78 (90 % ) ratio 66  p 12 % improvement from saba p nothing  prior to study with DLCO  71 % corrects to 78 % for alv volume  And classical curvature  - Spirometry 06/19/2016  FEV1 1.65  (84%)  Ratio 69  With no prior rx   - PFT's  01/14/2018  FEV1 1.68 (90 % ) ratio 68  p 18 % improvement from saba p nothing  prior to study with DLCO  84 % corrects to 90  % for alv volume  - 01/14/2018  After extensive coaching inhaler device,  effectiveness =    90% with elipta > try BREO 100 each am   She reports inability to trigger hfa and did fine previously on elipta though with now UACS issues she may not tolerate this either (see separate a/p)   Try breo as above then regroup in 6 weeks

## 2018-01-15 NOTE — Assessment & Plan Note (Signed)
Onset around 2008 - Allergy profile 05/22/2016 >  Eos 0.3 /  IgE  10 neg rast - singulair / gerd rx started 05/22/2016 >>> still with pnds 06/19/2016 > add dymista / 1st gen H1> no better but never took h1 so rec stop dymista  - no better 08/07/2016 on singulair/ anoro > rec stop both and just use prn saba/ h1 > improved 09/26/2016  - restarted gerd rx and 1st gen H1 blockers per guidelines  12/03/2017  - rec d/c fosfamax x 6 weeks 01/14/2018    Upper airway cough syndrome (previously labeled PNDS),  is so named because it's frequently impossible to sort out how much is  CR/sinusitis with freq throat clearing (which can be related to primary GERD)   vs  causing  secondary (" extra esophageal")  GERD from wide swings in gastric pressure that occur with throat clearing, often  promoting self use of mint and menthol lozenges that reduce the lower esophageal sphincter tone and exacerbate the problem further in a cyclical fashion.   These are the same pts (now being labeled as having "irritable larynx syndrome" by some cough centers) who not infrequently have a history of having failed to tolerate ace inhibitors,  dry powder inhalers(which remains to be seen here)  or  Oral  Biphosphonates (clearly worse since started)  or report having atypical/extraesophageal reflux symptoms that don't respond to standard doses of PPI  and are easily confused as having aecopd or asthma flares by even experienced allergists/ pulmonologists (myself included).     rec 6 weeks off fosfamax/ max rx for gerd and regroup in 6 weeks    I had an extended discussion with the patient reviewing all relevant studies completed to date and  lasting 15 to 20 minutes of a 25 minute visit    See device teaching which extended face to face time for this visit.  Each maintenance medication was reviewed in detail including emphasizing most importantly the difference between maintenance and prns and under what circumstances the prns are to be  triggered using an action plan format that is not reflected in the computer generated alphabetically organized AVS which I have not found useful in most complex patients, especially with respiratory illnesses  Please see AVS for specific instructions unique to this visit that I personally wrote and verbalized to the the pt in detail and then reviewed with pt  by my nurse highlighting any  changes in therapy recommended at today's visit to their plan of care.

## 2018-02-01 ENCOUNTER — Other Ambulatory Visit: Payer: Self-pay | Admitting: *Deleted

## 2018-02-01 DIAGNOSIS — R911 Solitary pulmonary nodule: Secondary | ICD-10-CM

## 2018-02-25 ENCOUNTER — Other Ambulatory Visit: Payer: Self-pay | Admitting: Internal Medicine

## 2018-02-25 ENCOUNTER — Ambulatory Visit (INDEPENDENT_AMBULATORY_CARE_PROVIDER_SITE_OTHER): Payer: Medicare Other | Admitting: Internal Medicine

## 2018-02-25 ENCOUNTER — Encounter: Payer: Self-pay | Admitting: Internal Medicine

## 2018-02-25 VITALS — BP 132/80 | HR 70 | Ht 63.0 in | Wt 148.0 lb

## 2018-02-25 DIAGNOSIS — R05 Cough: Secondary | ICD-10-CM | POA: Diagnosis not present

## 2018-02-25 DIAGNOSIS — J449 Chronic obstructive pulmonary disease, unspecified: Secondary | ICD-10-CM

## 2018-02-25 DIAGNOSIS — R058 Other specified cough: Secondary | ICD-10-CM

## 2018-02-25 MED ORDER — PANTOPRAZOLE SODIUM 40 MG PO TBEC: 40.0000 mg | DELAYED_RELEASE_TABLET | Freq: Every day | ORAL | 2 refills | Status: DC

## 2018-02-25 NOTE — Progress Notes (Signed)
Subjective:   Patient ID: [name], female    DOB: February 14, 1941     MRN: [id number]     Brief patient profile:  77   yowf  Only smoked age 77 - 62  s obvious sequelae with new cough while on a cruise in September 2016 in [place] > cxr > ct chest > PET > referred to pulmonary clinic 02/15/2015 by Dr  [name] UC with GOLD I copd 02/19/15     History of Present Illness  02/15/2015 1st [facility] office visit/ [name]   Chief Complaint  Patient presents with  . PULMONARY CONSULT    Pulmonary nodules. Pt reports some dyspnea with exertion, some morning dry to wet cough. Pt does not use any inhalers.   Cough better but some cc am congestion x years ? Worse in winter > total ? Usually swallows so can't say color or amt assoc with mild pnds  Doe only with exertion = MMRC1 = can walk nl pace, flat grade, can't hurry or go uphills or steps s mild sob  rec Please remember to go to the lab  department downstairs for your tests - we will call you with the results when they are available. 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 Follow-up    Coughing more and having more PND. Cough is non prod. She is not using her albuterol inhaler.   after stirs has  lots of nonproductive coughing assoc with sense of pnds  no better on anoro,  Not limited by breathing from desired activities   rec Stop anoro and montelukast to see what difference this makes For drainage / throat tickle try take CHLORPHENIRAMINE  4 mg - take one every 4 hours as needed -  available over the counter-   - late add:  pred x 6 days prn flare x one - consider adding gabapentin next ov    09/26/2016  f/u ov/[name] re: GOLD I copd [name] / no worse breathing or coughing off anoro/montelukast but maint on nasacort  Chief Complaint  Patient presents with  . Follow-up    Breathing is doing well and she is not coughing much. No new co's.  doe = MMRC1 = can walk nl pace, flat grade, can't hurry or go uphills or steps s sob   Only a little drainage p one chlorpheniramine in am and double strength at hs and sleep fine now rec Continue protonix and pepcid for another 2 months then fine to try to taper  off by stopping the protonix first and changing the pepcid 20 mg twice daily then taper the pepcid off as well and see if cough flares or not - if does flare, restart and you may need GI evaluation  For drainage / throat tickle try take CHLORPHENIRAMINE  4 mg - take one every 4 hours as needed - available over the counter- may cause drowsiness so start with just a bedtime dose or two and see  how you tolerate it before trying in daytime   If not satisfied we start you on gabapentin 100 mg three times daily     10/08/17  Excisional bx LUL c/w nec granuloma      12/03/2017 acute extended ov/[name] re: re-establish  sob gradually downhill x one year then much worse since June 2019 Chief Complaint  Patient presents with  . Acute Visit    Increased cough since June- prod but unsure sputum color. She states her breathing has been gradually worse since her last visit May 2019.   still using chlorpheniramine  And still sleeping fine but wakes up feeling very congested at usual hour and then all during the day has same sensation but not coughing up excess/purulent mucus Doe gradually worse x one year  = MMRC2 = can't walk a nl pace on a flat grade s sob but does fine slow and flat  Arthritis good control on arava  rec For drainage / throat tickle try take CHLORPHENIRAMINE  4 mg - take  one every 4 hours as needed - available over the counter- may cause drowsiness so start with just a bedtime dose or two and see how you tolerate it before trying in daytime   Pantoprazole (protonix) 40 mg   Take  30-60 min before first meal of the day and Pepcid (famotidine)  20 mg one @  bedtime until return to office - this is the best way to tell whether stomach acid is contributing to your problem.   GERD diet  Pl   Add if cough/sob worse > Prednisone 10 mg take  4 each am x 2 days,   2 each am x 2 days,  1 each am x 2 days and stop       01/14/2018  f/u ov/[name] re:  GOLD I copd with reversible component . Did not take the prednisone  Chief Complaint  Patient presents with  . Follow-up    PFT's done today. Her cough and SOB are about the same. No new co's.   Dyspnea:  MMRC1 = can walk nl pace, flat grade, can't hurry or go uphills or steps s sob   Cough: more of throat clearing/ daytime x years / p  rx fosfamax >>  worse since then  Sleeping: flat bed / one pillow SABA use: none 02: none  rec Start BREO 295 one click each am x 2 drags each am  Try off fosfamax for now  Please schedule a follow up office visit in 6 weeks, call sooner if needed        02/25/2018  f/u ov/[name] re: COPD GOLD 1/ maint breo and no fosfamax since last ov  Chief Complaint  Patient presents with  . Follow-up    Breathing is unchanged. She does not have a rescue inhaler.   Dyspnea:   MMRC1 = can walk nl pace, flat grade, can't hurry or go uphills or steps s sob   Cough: no longer  Coughing/ not  Much throat clearing  Sleeping: bed is flat/ one pillow SABA use: none 02: none    No obvious day to day or daytime variability or assoc excess/ purulent sputum or mucus plugs or hemoptysis or cp or chest tightness, subjective wheeze or overt sinus or hb symptoms.   Sleeping as above without nocturnal  or early am exacerbation  of respiratory  c/o's or need for noct saba. Also denies any obvious fluctuation  of symptoms with weather or environmental changes  or other aggravating or alleviating factors except as outlined above   No unusual exposure hx or h/o childhood pna/ asthma or knowledge of premature birth.  Current Allergies, Complete Past Medical History, Past Surgical History, Family History, and Social History were reviewed in [employer] record.  ROS  The following are not active complaints unless bolded Hoarseness, sore throat, dysphagia, dental problems, itching, sneezing,  nasal congestion or discharge of excess mucus or purulent secretions, ear ache,   fever, chills, sweats, unintended wt loss or wt gain, classically pleuritic or exertional cp,  orthopnea pnd or arm/hand swelling  or leg swelling, presyncope, palpitations, abdominal pain, anorexia, nausea, vomiting, diarrhea  or change in bowel habits or change in bladder habits, change in stools or change in urine, dysuria, hematuria,  rash, arthralgias, visual complaints, headache, numbness, weakness or ataxia or problems with walking or coordination,  change in mood or  memory.        Current Meds  Medication Sig  . acetaminophen (TYLENOL) 500 MG tablet Take 500 mg by mouth every 6 (six) hours as needed.  [name] atorvastatin (LIPITOR) 80 MG tablet Take 1 tablet (80 mg total) by mouth daily.  . CHLORPHENIRAMINE-ACETAMINOPHEN PO Take 1 tablet by mouth 2 (two) times daily.  . clopidogrel (PLAVIX) 75 MG tablet Take 1 tablet (75 mg total) by mouth daily.  . famotidine (PEPCID) 20 MG tablet One at bedtime  . fluticasone furoate-vilanterol (BREO ELLIPTA) 100-25 MCG/INH AEPB Inhale 1 puff into the lungs daily.  [name] leflunomide (ARAVA) 20 MG tablet Take 20 mg by mouth every other day.   . levothyroxine (SYNTHROID, LEVOTHROID) 88 MCG tablet Take 1 tablet (88 mcg total) by mouth daily before breakfast.  . loperamide (IMODIUM A-D) 2 MG tablet Take 2 mg by mouth 4 (four) times daily as needed for diarrhea or loose stools.  [name]  losartan (COZAAR) 25 MG tablet Take 1 tablet (25 mg total) by mouth daily. (Patient taking differently: Take 12.5 mg by mouth daily. )  . metoprolol succinate (TOPROL-XL) 25 MG 24 hr tablet Take 1 tablet (25 mg total) by mouth daily.  . psyllium (METAMUCIL) 58.6 % packet Take 1 packet by mouth daily.  [name] triamcinolone (NASACORT ALLERGY 24HR) 55 MCG/ACT AERO nasal inhaler Place 1 spray into the nose daily.   .      .                                    Objective:   Physical Exam  amb wf nad   01/14/2018        144  12/03/2017          147  09/26/2016        141  08/07/2016          146  06/19/2016        152  05/22/2016        148   02/15/15 155 lb 6.4 oz (70.489 kg)  01/29/15 154 lb (69.854 kg)  11/26/14 153 lb 8 oz (69.627 kg)    Vital signs reviewed - Note on arrival 02 sats  94% on RA       HEENT: nl dentition / oropharynx. Nl external ear canals without cough reflex -  Mild bilateral non-specific turbinate edema     NECK :  without JVD/Nodes/TM/ nl carotid upstrokes bilaterally   LUNGS: no acc muscle use,  Mild barrel  contour chest wall with bilateral  Distant bs s audible wheeze and  without cough on insp or exp maneuver and mild  Hyperresonant  to  percussion bilaterally     CV:  RRR  no s3 or murmur or increase in P2, and no edema   ABD:  soft and nontender with pos end  insp Hoover's  in the supine position. No bruits or organomegaly appreciated, bowel sounds nl  MS:   Nl gait/  ext warm without deformities, calf tenderness, cyanosis or clubbing djd changes both hands  SKIN: warm and dry without lesions    NEURO:  alert, approp, nl sensorium with  no motor or cerebellar deficits apparent.                Assessment & Plan:

## 2018-02-25 NOTE — Patient Instructions (Signed)
Ok to start back on fosfamax with no other changes for now to see what difference it make but if upper airway symptoms are worse you will need alternative (Recast/ prolia)    Remember to brush your teeth and tongue with [employer] based toothpaste and if not better we need to see you back for a different inhaler (you will need to bring your drug formulary with you )     Please schedule a follow up visit in 2 months but call sooner if needed

## 2018-02-26 ENCOUNTER — Encounter: Payer: Self-pay | Admitting: Internal Medicine

## 2018-02-26 ENCOUNTER — Ambulatory Visit: Payer: Medicare Other | Admitting: Internal Medicine

## 2018-02-26 VITALS — BP 126/82 | HR 82 | Temp 98.0°F | Ht 63.0 in | Wt 147.0 lb

## 2018-02-26 DIAGNOSIS — M069 Rheumatoid arthritis, unspecified: Secondary | ICD-10-CM

## 2018-02-26 DIAGNOSIS — M810 Age-related osteoporosis without current pathological fracture: Secondary | ICD-10-CM

## 2018-02-26 DIAGNOSIS — D510 Vitamin B12 deficiency anemia due to intrinsic factor deficiency: Secondary | ICD-10-CM | POA: Diagnosis not present

## 2018-02-26 DIAGNOSIS — Z23 Encounter for immunization: Secondary | ICD-10-CM

## 2018-02-26 DIAGNOSIS — Q8909 Congenital malformations of spleen: Secondary | ICD-10-CM

## 2018-02-26 DIAGNOSIS — E785 Hyperlipidemia, unspecified: Secondary | ICD-10-CM | POA: Diagnosis not present

## 2018-02-26 NOTE — Assessment & Plan Note (Signed)
Augmentin Rx 

## 2018-02-26 NOTE — Assessment & Plan Note (Signed)
DEXA and Rx (Fosamax) per Dr Valentino Saxon 2019

## 2018-02-26 NOTE — Addendum Note (Signed)
Addended by: Karren Cobble on: 02/26/2018 11:06 AM   Modules accepted: Orders

## 2018-02-26 NOTE — Assessment & Plan Note (Signed)
Off Prednisone. On [place]

## 2018-02-26 NOTE — Assessment & Plan Note (Signed)
On B12 

## 2018-02-26 NOTE — Assessment & Plan Note (Signed)
Lipitor 

## 2018-02-26 NOTE — Progress Notes (Signed)
Subjective:  Patient ID: [name], female    DOB: April 01, 1941  Age: 77 y.o. MRN: [id number]  CC: No chief complaint on file.   HPI CIRA DEYOE presents for RA, GERD, hypothyroidism f/u  Outpatient Medications Prior to Visit  Medication Sig Dispense Refill  . acetaminophen (TYLENOL) 500 MG tablet Take 500 mg by mouth every 6 (six) hours as needed.    . Alendronate Sodium (FOSAMAX PO) Take by mouth.    [name] atorvastatin (LIPITOR) 80 MG tablet Take 1 tablet (80 mg total) by mouth daily. 90 tablet 3  . CHLORPHENIRAMINE-ACETAMINOPHEN PO Take 1 tablet by mouth 2 (two) times daily.    . clopidogrel (PLAVIX) 75 MG tablet Take 1 tablet (75 mg total) by mouth daily. 90 tablet 3  . famotidine (PEPCID) 20 MG tablet One at bedtime 30 tablet 11  . fluticasone furoate-vilanterol (BREO ELLIPTA) 100-25 MCG/INH AEPB Inhale 1 puff into the lungs daily. 180 each 3  . leflunomide (ARAVA) 20 MG tablet Take 20 mg by mouth every other day.     . levothyroxine (SYNTHROID, LEVOTHROID) 88 MCG tablet Take 1 tablet (88 mcg total) by mouth daily before breakfast. 90 tablet 3  . loperamide (IMODIUM A-D) 2 MG tablet Take 2 mg by mouth 4 (four) times daily as needed for diarrhea or loose stools.    [name] losartan (COZAAR) 25 MG tablet Take 1 tablet (25 mg total) by mouth daily. (Patient taking differently: Take 12.5 mg by mouth daily. ) 90 tablet 3  . metoprolol succinate (TOPROL-XL) 25 MG 24 hr tablet Take 1 tablet (25 mg total) by mouth daily. 90 tablet 3  . pantoprazole (PROTONIX) 40 MG tablet Take 1 tablet (40 mg total) by mouth daily. Take 30-60 min before first meal of the day 30 tablet 2  . predniSONE (DELTASONE) 10 MG tablet Take as directed 100 tablet 0  . psyllium (METAMUCIL) 58.6 % packet Take 1 packet by mouth daily.    [name] triamcinolone (NASACORT ALLERGY 24HR) 55 MCG/ACT AERO nasal inhaler Place 1 spray into the nose daily.     . fluticasone furoate-vilanterol (BREO ELLIPTA) 100-25 MCG/INH AEPB Inhale 1 puff  into the lungs daily. 7 each 0   No facility-administered medications prior to visit.     ROS: Review of Systems  Constitutional: Negative for activity change, appetite change, chills, fatigue and unexpected weight change.  HENT: Negative for congestion, mouth sores and sinus pressure.   Eyes: Negative for visual disturbance.  Respiratory: Negative for cough and chest tightness.   Gastrointestinal: Negative for abdominal pain and nausea.  Genitourinary: Negative for difficulty urinating, frequency and vaginal pain.  Musculoskeletal: Positive for arthralgias. Negative for back pain and gait problem.  Skin: Negative for pallor and rash.  Neurological: Negative for dizziness, tremors, weakness, numbness and headaches.  Psychiatric/Behavioral: Negative for confusion, sleep disturbance and suicidal ideas.    Objective:  BP 126/82 (BP Location: Left Arm, Patient Position: Sitting, Cuff Size: Normal)   Pulse 82   Temp 98 F (36.7 C) (Oral)   Ht 5\' 3"  (1.6 m)   Wt 147 lb (66.7 kg)   SpO2 95%   BMI 26.04 kg/m   BP Readings from Last 3 Encounters:  02/26/18 126/82  02/25/18 132/80  01/14/18 112/70    Wt Readings from Last 3 Encounters:  02/26/18 147 lb (66.7 kg)  02/25/18 148 lb (67.1 kg)  01/14/18 144 lb (65.3 kg)    Physical Exam  Lab Results  Component Value  Date   WBC 8.2 12/03/2017   HGB 13.5 12/03/2017   HCT 40.2 12/03/2017   PLT 353.0 12/03/2017   GLUCOSE 108 (H) 12/03/2017   CHOL 165 02/27/2017   TRIG 96.0 02/27/2017   HDL 69.80 02/27/2017   LDLDIRECT 162.1 10/28/2009   LDLCALC 76 02/27/2017   ALT 27 10/08/2017   AST 28 10/08/2017   NA 136 12/03/2017   K 5.2 (H) 12/03/2017   CL 104 12/03/2017   CREATININE 1.54 (H) 12/03/2017   BUN 37 (H) 12/03/2017   CO2 23 12/03/2017   TSH 4.54 (H) 08/24/2017   INR 0.98 10/08/2017    Dg Chest 2 View  Result Date: 12/03/2017 CLINICAL DATA:  Chronic cough, shortness of breath. EXAM: CHEST - 2 VIEW COMPARISON:   Radiograph of October 08, 2017. FINDINGS: The heart size and mediastinal contours are within normal limits. Both lungs are clear. Sternotomy wires are noted. No pneumothorax or pleural effusion is noted. The visualized skeletal structures are unremarkable. IMPRESSION: No active cardiopulmonary disease. Electronically Signed   By: Marijo Conception, M.D.   On: 12/03/2017 10:20    Assessment & Plan:   There are no diagnoses linked to this encounter.   No orders of the defined types were placed in this encounter.    Follow-up: No follow-ups on file.  [name], MD

## 2018-02-28 ENCOUNTER — Encounter: Payer: Self-pay | Admitting: Internal Medicine

## 2018-02-28 NOTE — Assessment & Plan Note (Addendum)
  Onset around 2008 - Allergy profile 05/22/2016 >  Eos 0.3 /  IgE  10 neg rast - singulair / gerd rx started 05/22/2016 >>> still with pnds 06/19/2016 > add dymista / 1st gen H1> no better but never took h1 so rec stop dymista  - no better 08/07/2016 on singulair/ anoro > rec stop both and just use prn saba/ h1 > improved 09/26/2016  - restarted gerd rx and 1st gen H1 blockers per guidelines  12/03/2017   - rec d/c fosfamax x 6 weeks 01/14/2018 > resolved 02/25/2018 so ok to rechallenge with fosfamax 02/25/2018 and if flares try alternative   Upper airway cough syndrome (previously labeled PNDS),  is so named because it's frequently impossible to sort out how much is  CR/sinusitis with freq throat clearing (which can be related to primary GERD)   vs  causing  secondary (" extra esophageal")  GERD from wide swings in gastric pressure that occur with throat clearing, often  promoting self use of mint and menthol lozenges that reduce the lower esophageal sphincter tone and exacerbate the problem further in a cyclical fashion.   These are the same pts (now being labeled as having "irritable larynx syndrome" by some cough centers) who not infrequently have a history of having failed to tolerate ace inhibitors,  dry powder inhalers or biphosphonates or report having atypical/extraesophageal reflux symptoms that don't respond to standard doses of PPI  and are easily confused as having aecopd or asthma flares by even experienced allergists/ pulmonologists (myself included).    NB the  ramp to expected improvement in symptoms from an empiric trial of PPI or fosfamax (and for that matter, worsening, if a chronic effective medication is stopped)  can be measured in weeks, not days, a common misconception because this is not the same as treating heartburn (no immediate cause and effect relationship)  so that response to therapy or lack thereof can be very difficult to assess especially if the patient is not adherent to the  treatment plan which includes dietary restrictions.    >>> will see her back in 2 months to see how she's doing with the above changes     I had an extended discussion with the patient reviewing all relevant studies completed to date and  lasting 15 to 20 minutes of a 25 minute visit    See device teaching which extended face to face time for this visit.  Each maintenance medication was reviewed in detail including emphasizing most importantly the difference between maintenance and prns and under what circumstances the prns are to be triggered using an action plan format that is not reflected in the computer generated alphabetically organized AVS which I have not found useful in most complex patients, especially with respiratory illnesses  Please see AVS for specific instructions unique to this visit that I personally wrote and verbalized to the the pt in detail and then reviewed with pt  by my nurse highlighting any  changes in therapy recommended at today's visit to their plan of care.

## 2018-02-28 NOTE — Assessment & Plan Note (Addendum)
Quit smoking in her 35s - PFT's  02/19/15   FEV1 1.78 (90 % ) ratio 66  p 12 % improvement from saba p nothing  prior to study with DLCO  71 % corrects to 78 % for alv volume  And classical curvature  - Spirometry 06/19/2016  FEV1 1.65  (84%)  Ratio 69  With no prior rx  - PFT's  01/14/2018  FEV1 1.68 (90 % ) ratio 68  p 18 % improvement from saba p nothing  prior to study with DLCO  84 % corrects to 90  % for alv volume  - 01/14/2018  After extensive coaching inhaler device,  effectiveness =    90% with elipta > try BREO 100 each am   Adequate control on present rx, reviewed in detail with pt > no change in rx needed    - The proper method of use, as well as anticipated side effects, of a metered-dose inhaler are discussed and demonstrated to the patient.    Reminded to brush teeth/ gargle a breo to prevent irritation of upper airway

## 2018-03-08 ENCOUNTER — Other Ambulatory Visit: Payer: Self-pay | Admitting: Internal Medicine

## 2018-03-14 ENCOUNTER — Ambulatory Visit: Payer: Medicare Other | Admitting: Cardiothoracic Surgery

## 2018-03-16 ENCOUNTER — Other Ambulatory Visit: Payer: Self-pay | Admitting: Internal Medicine

## 2018-04-04 ENCOUNTER — Ambulatory Visit
Admission: RE | Admit: 2018-04-04 | Discharge: 2018-04-04 | Disposition: A | Discharge status: Home / Self Care | Payer: Medicare Other | Source: Ambulatory Visit | Attending: Cardiothoracic Surgery | Admitting: Cardiothoracic Surgery

## 2018-04-04 ENCOUNTER — Ambulatory Visit: Payer: Medicare Other | Admitting: Cardiothoracic Surgery

## 2018-04-04 VITALS — BP 170/84 | HR 83 | Resp 20 | Ht 63.0 in | Wt 147.0 lb

## 2018-04-04 DIAGNOSIS — R911 Solitary pulmonary nodule: Secondary | ICD-10-CM

## 2018-04-04 NOTE — Progress Notes (Signed)
[address]       [place] [address]             [phone]      Rebecca E Cohick Henry Medical Record #[id number] Date of Birth: 1940/11/02  Referring: Tanda Rockers, MD Primary Care: Cassandria Anger, MD  Chief Complaint:   POST OP FOLLOW UP 10/08/2017 PREOPERATIVE DIAGNOSIS:  Left lower and left upper lobe lung nodules. POSTOPERATIVE DIAGNOSIS:  Left lower and left upper lobe lung nodules. SURGICAL PROCEDURE:  Video bronchoscopy, electromagnetic navigation bronchoscopy with biopsies of left lower lobe and left upper lobe lung mass. SURGEON:  Lilia Argue. Servando Snare, MD  03/02/2015 OPERATIVE REPORT PREOPERATIVE DIAGNOSIS: Left upper lobe lung mass POSTOPERATIVE DIAGNOSIS: Left upper lobe lung mass. 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 PROCEDURE PERFORMED: Video bronchoscopy, left video-assisted thoracoscopy, minithoracotomy, wedge resection of left upper lobe. SURGEON: Lanelle Bal, M.D Previous left VATS lung resection Lung, wedge biopsy/resection, left upper lobe - NECROTIZING GRANULOMA, 1.7 CM. - NO TUMOR SEEN. - SEE COMMENT. Microscopic Comment Sections demonstrate a 1.7 cm necrotizing granuloma. There is associated reactive pneumocyte hyperplasia with reactive atypia but no true tumor is identified. An AFB stain is performed on a representative section which is negative for acid fast bacilli. A GMS and PAS stain are also performed on a representative section which are both negative for fungal organisms. Although specimen stains for microorganisms are negative, stains are not as sensitive as other microbiologic techniques including culture. Please correlate with culture results. (RAH:gt, 03/04/15) [name] Pathologist, Electronic Signature (Case signed 03/04/2015  History of Present Illness:     Patient was  admitted to [facility] in December 2017 with respiratory problems  , a CT scan of the chest was  done at that time the report notes recurrent malignancy and old stable line, however the patient has no previous history of lung cancer nor lobectomy.  Repeat ct of chest done with enlarging are left upper lobe pleural based, not at area of previous wedge resection (patient did not have lobectomy as some notes indicate, this was interpreted as recurrent malignancy however the patient previous surgery was not related to malignancy, she has no previous history of lung cancer.  Patient returned last week with a follow-up CT scan, to evaluate a approximately 1 cm nodule in the left lower lobe at the diaphragm, and multiple other smaller nodes in the left and right lung.  In June 2019 repeat ENB with biopsy of left lung lesion was done.  Since last seen she has had no new symptoms no cough fever chills.  She returns now with follow up ct of chest  She did smoke for 5-7 years but quit at age 62. With her limited smoking history she would not be eligible for lung cancer screening CT program.  Past Medical History:  Diagnosis Date  . Abdominal aortic aneurysm (HCC)    REPAIRED IN 1996 BY [name]  AND HAS RECENTLY BEEN FOLLOWED BY [name]  . 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 Adenomatous colon polyp    tubular  . Anemia   . CAD in native artery 1996, 2002, 2005    Status post CABG x1 with LIMA-LAD for ostial LAD 90% stenosis --> down to 50% in 2002 and 30% in 2005.;  Atretic LIMA; Myoview 06/2010: Fixed anteroseptal, apical and inferoapical defect with moderate  size. Most likely scar. Mild subendocardial ischemia. EF 71% LOW RISK.   [name] Cervical disc disease   . Chronic kidney disease   . COPD (chronic obstructive pulmonary disease) ([place])   . Diverticulosis   . Hyperlipidemia   . Hypertension   . Hypothyroidism (acquired)    hypo  . Myocardial infarction [facility]) Aug. 2016   TIA  . Polymyalgia rheumatica (HCC)    2011  Dr. Charlestine Night  . Rheumatoid arthritis ([place]) 2011   Dr.Truslow; fracture knees, hands and wrists -   . S/P CABG x 1 1996   CABG--LIMA-LAD for ostial LAD (not felt to be PCI amenable). EF NORMAL then; LIMA now atretic  . Shortness of breath dyspnea    with exertion  . Stroke [facility]) 11-2014   TIA   . Urinary frequency      Social History   Tobacco Use  Smoking Status Former Smoker  . Last attempt to quit: 12/04/1958  . Years since quitting: 59.3  Smokeless Tobacco Never Used    Social History   Substance and Sexual Activity  Alcohol Use No     Allergies  Allergen Reactions  . Aspirin Other (See Comments)    Hurts stomach  . Codeine Nausea And Vomiting  . Nitroglycerin Other (See Comments)    Heart rate drops    Current Outpatient Medications  Medication Sig Dispense Refill  . acetaminophen (TYLENOL) 500 MG tablet Take 500 mg by mouth every 6 (six) hours as needed.    . Alendronate Sodium (FOSAMAX PO) Take by mouth.    [name] atorvastatin (LIPITOR) 80 MG tablet TAKE 1 TABLET BY MOUTH EVERY DAY 90 tablet 3  . CHLORPHENIRAMINE-ACETAMINOPHEN PO Take 1 tablet by mouth 2 (two) times daily.    . clopidogrel (PLAVIX) 75 MG tablet TAKE 1 TABLET BY MOUTH  DAILY 90 tablet 1  . famotidine (PEPCID) 20 MG tablet One at bedtime 30 tablet 11  . fluticasone furoate-vilanterol (BREO ELLIPTA) 100-25 MCG/INH AEPB Inhale 1 puff into the lungs daily. 180 each 3  . leflunomide (ARAVA) 20 MG tablet Take 20 mg by mouth every other day.     . levothyroxine (SYNTHROID, LEVOTHROID) 88 MCG tablet Take 1 tablet (88 mcg total) by mouth daily before breakfast. 90 tablet 3  . loperamide (IMODIUM A-D) 2 MG tablet Take 2 mg by mouth 4 (four) times daily as needed for diarrhea or loose stools.    [name] losartan (COZAAR) 25 MG tablet TAKE 1 TABLET BY MOUTH  DAILY 90 tablet 1  . metoprolol succinate (TOPROL-XL) 25 MG 24 hr tablet TAKE 1 TABLET BY MOUTH  DAILY 90 tablet 1  . pantoprazole (PROTONIX) 40 MG tablet Take 1  tablet (40 mg total) by mouth daily. Take 30-60 min before first meal of the day 30 tablet 2  . predniSONE (DELTASONE) 10 MG tablet Take as directed 100 tablet 0  . psyllium (METAMUCIL) 58.6 % packet Take 1 packet by mouth daily.    [name] triamcinolone (NASACORT ALLERGY 24HR) 55 MCG/ACT AERO nasal inhaler Place 1 spray into the nose daily.      No current facility-administered medications for this visit.    No change in review of systems since last week Review of Systems  Constitutional: Negative.   HENT: Negative.   Eyes: Negative.   Respiratory: Negative.   Cardiovascular: Negative.  Negative for chest pain, palpitations and PND.  Gastrointestinal: Negative.   Genitourinary: Negative.   Musculoskeletal: Negative.   Skin: Negative.   Neurological: Negative.  Endo/Heme/Allergies: Negative.   Psychiatric/Behavioral: Negative.      Physical Exam: BP (!) 170/84   Pulse 83   Resp 20   Ht 5\' 3"  (1.6 m)   Wt 147 lb (66.7 kg)   SpO2 96% Comment: RA  BMI 26.04 kg/m  General appearance: alert, cooperative, appears stated age and no distress Head: Normocephalic, without obvious abnormality, atraumatic Neck: no adenopathy, no carotid bruit, no JVD, supple, symmetrical, trachea midline and thyroid not enlarged, symmetric, no tenderness/mass/nodules Lymph nodes: Cervical, supraclavicular, and axillary nodes normal. Resp: clear to auscultation bilaterally Back: symmetric, no curvature. ROM normal. No CVA tenderness. Cardio: regular rate and rhythm, S1, S2 normal, no murmur, click, rub or gallop GI: soft, non-tender; bowel sounds normal; no masses,  no organomegaly Extremities: extremities normal, atraumatic, no cyanosis or edema Neurologic: Grossly normal Diagnostic Studies & Laboratory data:     Recent Radiology Findings: Ct Chest Wo Contrast  Result Date: 04/04/2018 CLINICAL DATA:  Followup pulmonary nodules. EXAM: CT CHEST WITHOUT CONTRAST TECHNIQUE: Multidetector CT imaging of the  chest was performed following the standard protocol without IV contrast. COMPARISON:  10/04/2017 FINDINGS: Cardiovascular: The heart size appears normal. Previous median sternotomy and CABG procedure. Aortic atherosclerosis. No pericardial effusion. Mediastinum/Nodes: Normal appearance of the thyroid gland. The trachea appears patent and is midline. No axillary or supraclavicular adenopathy. Lungs/Pleura: No pleural effusion identified. Mild to moderate changes of emphysema. Pulmonary nodule within the right lower lobe measures 1.7 by 1.6 cm, image 135/8. Previously 1.2 x 1.2 cm. Left upper lobe pulmonary nodule is stable measuring 7 mm, image 32/8. The posterior right lung base nodule appears less solid on today's study measuring 4 mm, image 140/8. Previously 4 mm. Within the medial aspect of the right lower lobe there is a 5 mm nodule, image 87/8. Previously 8 mm. Adjacent nodule measures 3 mm, image 89/8. Previously 4 mm. Upper Abdomen: No acute findings identified.  Left kidney cysts. Musculoskeletal: Scoliosis and degenerative disc disease. IMPRESSION: 1. There has been interval increase in size of the left lower lobe pulmonary nodule, suspicious for primary bronchogenic carcinoma. 2. Stable left upper lobe pulmonary nodule measuring 7 mm. 3. Pulmonary nodules within the medial right lower lobe are slightly decreased in size from previous exam. Posterior right lung base nodule appears less solid. Findings favor sequelae of inflammatory or infectious process. 4. Aortic Atherosclerosis (ICD10-I70.0) and Emphysema (ICD10-J43.9). Electronically Signed   By: Kerby Moors M.D.   On: 04/04/2018 09:35    I have independently reviewed the above radiology studies  and reviewed the findings with the patient.    Ct Super D Chest Wo Contrast  Result Date: 10/04/2017 CLINICAL DATA:  Lung nodule. EXAM: CT CHEST WITHOUT CONTRAST TECHNIQUE: Multidetector CT imaging of the chest was performed using thin slice collimation  for electromagnetic bronchoscopy planning purposes, without intravenous contrast. COMPARISON:  CT chest 06/28/2017. FINDINGS: Cardiovascular: The heart size appears within normal limits. No pericardial effusion. Previous median sternotomy and CABG procedure. Aortic atherosclerosis noted. Mediastinum/Nodes: Normal appearance of the thyroid gland. The trachea appears patent and is midline. Normal appearance of the esophagus. No enlarged mediastinal or hilar lymph nodes. No axillary or supraclavicular adenopathy. Lungs/Pleura: Postoperative changes involving the left upper lobe are again noted. The index nodule within the left upper lobe measures 7 mm, image 18/8. This is compared with 4 mm previously. The index nodule within the left lower lobe measures 1.2 x 1.2 cm, image 117/8. Previously 1.1 x 0.9 cm. 4 mm right lower lobe  lung nodule is noted, image 125/8. Previously this measured the same. Within the medial right lower lobe there is an 8 mm subpleural nodule, image 72/2. Stable from comparison exam. In the right upper lobe there is a small nodule measuring 5 mm, image 43/8. Stable from previous exam. Upper Abdomen: Lobular appearing left kidney noted. The adrenal glands are unremarkable. No acute abnormality noted. Musculoskeletal: Mild degenerative disc disease noted throughout the thoracic spine. No suspicious bone lesions. IMPRESSION: 1. There has been increase in size of left lower lobe lung nodule compared with 06/28/2017. Small left upper lobe lung nodule is also increased in size. 2. Right lower lobe and right upper lobe lung nodules are stable in the interval. 3.  Aortic Atherosclerosis (ICD10-I70.0). Electronically Signed   By: Kerby Moors M.D.   On: 10/04/2017 10:57       CLINICAL DATA:  Subsequent treatment strategy for lung nodule.  EXAM: NUCLEAR MEDICINE PET SKULL BASE TO THIGH  TECHNIQUE: 7.8 mCi F-18 FDG was injected intravenously. Full-ring PET imaging was performed from the  skull base to thigh after the radiotracer. CT data was obtained and used for attenuation correction and anatomic localization.  Fasting blood glucose: 100 mg/dl  Mediastinal blood pool activity: SUV max 3.2  COMPARISON:  CT chest 06/28/2017, 06/08/2016, 04/15/2016, PET 02/10/2015 and CT chest 01/29/2015.  FINDINGS: NECK: No hypermetabolic lymph nodes in the neck.  Incidental CT findings: None.  CHEST: Nodular scarring in the medial left upper lobe does not show abnormal hypermetabolism. A 10 mm round nodule in the inferior left lower lobe has an SUV max 3.4, is stable in size from 06/28/2017 but new from CT chest 06/08/2016. No hypermetabolic mediastinal, hilar or axillary lymph nodes.  Incidental CT findings: Atherosclerotic calcification of the arterial vasculature. Heart is enlarged. No pericardial or pleural effusion. 6 mm apical left upper lobe nodule (series 8, image 11), too small for PET resolution.  ABDOMEN/PELVIS: No abnormal hypermetabolism in the liver, adrenal glands, spleen or pancreas. No hypermetabolic lymph nodes.  Incidental CT findings: Low-attenuation lesions in the kidneys are difficult to further characterize without post-contrast imaging. Splenectomy.  SKELETON: No abnormal osseous hypermetabolism.  Incidental CT findings: Degenerative changes in the spine.  IMPRESSION: 1. Mildly hypermetabolic left lower lobe nodule, stable in size from 06/28/2017 but new from 06/08/2016. Findings are indeterminate with both malignant and infectious etiologies considered, given the short interval from 06/28/2017. Consider follow-up CT chest in 3-4 weeks in further initial evaluation. 2. 6 mm apical left upper lobe nodule is too small for PET resolution. 3.  Aortic atherosclerosis (ICD10-170.0).   Electronically Signed   By: Lorin Picket M.D.   On: 07/05/2017 09:09   I have independently reviewed the above radiology studies  and reviewed  the findings with the patient.   Dg Chest 2 View  Result Date: 04/17/2016 CLINICAL DATA:  Productive cough for the past month. EXAM: CHEST  2 VIEW COMPARISON:  04/15/2016; 07/22/2015; chest CT - 04/15/2016 FINDINGS: Grossly unchanged cardiac silhouette and mediastinal contours post median sternotomy. Atherosclerotic plaque within the thoracic aorta. The lungs remain hyperexpanded with flattening of the diaphragms and thinning of the biapical pulmonary parenchyma. Known ill-defined soft tissue about the left upper lobe surgical resection site is not well demonstrated on the present examination. No focal airspace opacities. No pleural effusion or pneumothorax. No evidence of edema. IMPRESSION: 1. Hyperexpanded lungs without acute cardiopulmonary disease. Emphysema. (ICD10-J43.9) 2. Previously identified soft tissue about the left upper lobe surgical resection site is  suboptimally evaluated on the present examination. Correlation with report from chest CT performed 04/15/2016 is recommended. Electronically Signed   By: Sandi Mariscal M.D.   On: 04/17/2016 15:42     Ct Angio Chest Pe W And/or Wo Contrast  Result Date: 04/15/2016 CLINICAL DATA:  Elevated D-dimer.  Productive cough congestion. EXAM: CT ANGIOGRAPHY CHEST WITH CONTRAST TECHNIQUE: Multidetector CT imaging of the chest was performed using the standard protocol during bolus administration of intravenous contrast. Multiplanar CT image reconstructions and MIPs were obtained to evaluate the vascular anatomy. CONTRAST:  80 cc Isovue 370 IV COMPARISON:  11/23/2015 FINDINGS: Cardiovascular: No filling defects in the pulmonary arteries to suggest pulmonary emboli. Heart is normal size. Prior CABG. Aorta is tortuous with scattered calcifications, non aneurysmal. Mediastinum/Nodes: Mildly enlarged mediastinal lymph nodes. Subcarinal lymph node has a short axis diameter of 13 mm. Other small scattered paratracheal and AP window lymph nodes. No hilar or  axillary adenopathy. Lungs/Pleura: Increasing density noted in the medial left upper lobe in the area of prior postoperative change. This soft tissue measures 2.2 x 1.7 cm on image 15. Given the location in the postoperative bed, this is concerning for tumor recurrence. Nodule in the left lower lobe on image 38 measures 8 mm on image 38, stable. Scarring noted in the lung bases. No pleural effusions. Upper Abdomen: Imaging into the upper abdomen shows no acute findings. Musculoskeletal: Chest wall soft tissues are unremarkable. No acute bony abnormality or focal bone lesion. Review of the MIP images confirms the above findings. IMPRESSION: Enlarging abnormal soft tissue in the left upper lobe at the prior surgical site concerning for recurrent tumor. This measures up to 2.2 cm. This could be further evaluated with PET CT. Stable left lower lobe pulmonary nodule. Borderline and mildly enlarged mediastinal lymph nodes. Electronically Signed   By: Rolm Baptise M.D.   On: 04/15/2016 15:26    Ct Chest Wo Contrast  Result Date: 11/23/2015 CLINICAL DATA:  Followup left lung nodule. Previous wedge resection revealed necrotizing granuloma. EXAM: CT CHEST WITHOUT CONTRAST TECHNIQUE: Multidetector CT imaging of the chest was performed following the standard protocol without IV contrast. COMPARISON:  01/29/2015 FINDINGS: Cardiovascular: Heart size within normal limits. Aortic atherosclerosis noted. Previous CABG. Mediastinum/Lymph Nodes: No masses or pathologically enlarged lymph nodes identified on this un-enhanced exam. Lungs/Pleura: Postop changes seen in the medial left upper lobe from previous wedge resection. 8 mm mean diameter perifissural nodule nodule is seen in the left midlung on image 66/4. This is new since previous study, but likely represents an intrapulmonary lymph node. Mild centrilobular emphysema noted. Bibasilar scarring and tiny sub-cm posterior right lower lobe pulmonary nodule remains stable. No  evidence of acute infiltrate or pleural effusion. Upper abdomen: No acute findings. Musculoskeletal: No chest wall mass or suspicious bone lesions identified. IMPRESSION: Postop changes in left upper lobe. 8 mm mean diameter perifissural nodule in the left midlung is new, but likely represents an intrapulmonary lymph node. Non-contrast chest CT at 6-12 months is recommended. If the nodule is stable at time of repeat CT, then future CT at 18-24 months (from today's scan) is considered optional for low-risk patients, but is recommended for high-risk patients. This recommendation follows the consensus statement: Guidelines for Management of Incidental Pulmonary Nodules Detected on CT Images:From the Fleischner Society 2017; published online before print (10.1148/radiol.1937902409). Aortic atherosclerosis and emphysema. Electronically Signed   By: Earle Gell M.D.   On: 11/23/2015 15:46    Recent Lab Findings: Lab Results  Component  Value Date   WBC 8.2 12/03/2017   HGB 13.5 12/03/2017   HCT 40.2 12/03/2017   PLT 353.0 12/03/2017   GLUCOSE 108 (H) 12/03/2017   CHOL 165 02/27/2017   TRIG 96.0 02/27/2017   HDL 69.80 02/27/2017   LDLDIRECT 162.1 10/28/2009   LDLCALC 76 02/27/2017   ALT 27 10/08/2017   AST 28 10/08/2017   NA 136 12/03/2017   K 5.2 (H) 12/03/2017   CL 104 12/03/2017   CREATININE 1.54 (H) 12/03/2017   BUN 37 (H) 12/03/2017   CO2 23 12/03/2017   TSH 4.54 (H) 08/24/2017   INR 0.98 10/08/2017   10/2015   03/2016   PFT's 10 /2016 FEV1 1.58 80% DLCO 16.27 71%  Interpretation: The FEV1 is normal, but the FEV1/FVC ratio and FEF25-75% are reduced. While the TLC, FRC and SVC are within normal limits, the RV is increased. Following administration of bronchodilators, there is a slight response. The reduced diffusing capacity indicates a mild loss of functional alveolar capillary surface. However, the diffusing capacity was not corrected for the patient's hemoglobin. Pulmonary  Function Diagnosis: Minimal Obstructive Airways Disease with reversibility Mild Diffusion Defect               Assessment / Plan:   Previous history of resection of a left upper lobe nodule that was found to be inflammatory in nature without malignancy.   Follow-up CT scan follow-up scans have shown Interval development of 11 mm nodule in left lower lobe  And  new 5 mm nodule in left lung apex  Scan today shows slight enlargement of the nodules over a short interval 3 months, the scan was done as a super D format. Cytologies and pathology of biopsies both to the left upper lobe and left lower lobe from 09/2017 were  were negative for malignancy, initial smears suggested granulomas but final report did not mention this.   Patient comes in today with a follow-up CT scan.  Scan shows very slight enlargement of the lesion in the left lower lobe, adjacent to the diaphragm.  I discussed with the patient, consideration for CT-guided needle biopsy or resection.  She was reluctant to proceed with either of these but is willing to have a follow-up CT scan in 4 months.     Grace Isaac MD      [address] [place] [address] Office [phone]   Beeper [phone]  04/04/2018 10:19 AM

## 2018-04-30 ENCOUNTER — Ambulatory Visit: Payer: Medicare Other | Admitting: Internal Medicine

## 2018-04-30 ENCOUNTER — Encounter: Payer: Self-pay | Admitting: Internal Medicine

## 2018-04-30 VITALS — BP 124/80 | HR 95 | Ht 63.0 in | Wt 148.0 lb

## 2018-04-30 DIAGNOSIS — R058 Other specified cough: Secondary | ICD-10-CM

## 2018-04-30 DIAGNOSIS — R05 Cough: Secondary | ICD-10-CM

## 2018-04-30 DIAGNOSIS — J449 Chronic obstructive pulmonary disease, unspecified: Secondary | ICD-10-CM | POA: Diagnosis not present

## 2018-04-30 DIAGNOSIS — R911 Solitary pulmonary nodule: Secondary | ICD-10-CM | POA: Diagnosis not present

## 2018-04-30 NOTE — Patient Instructions (Addendum)
Try off fosfamax now  to see if throat clearing is better and if it is you Prolia or Reclast as options  If not better after 3 months, bring your formulary with you and we'll try you on a different inhaler- try off the breo for a week prior to the visit you can   Please schedule a follow up visit in 3 months but call sooner if needed

## 2018-04-30 NOTE — Progress Notes (Signed)
Subjective:   Patient ID: [name], female    DOB: 05-10-1940     MRN: [id number]     Brief patient profile:  25   yowf  Only smoked age 77 - 77  s obvious sequelae with new cough while on a cruise in September 2016 in [place] > cxr > ct chest > PET > referred to pulmonary clinic 02/15/2015 by Dr  Philis Fendt [facility] with GOLD I copd 02/19/15     History of Present Illness  02/15/2015 1st [facility] office visit/ [name]   Chief Complaint  Patient presents with  . PULMONARY CONSULT    Pulmonary nodules. Pt reports some dyspnea with exertion, some morning dry to wet cough. Pt does not use any inhalers.   Cough better but some cc am congestion x years ? Worse in winter > total ? Usually swallows so can't say color or amt assoc with mild pnds  Doe only with exertion = MMRC1 = can walk nl pace, flat grade, can't hurry or go uphills or steps s mild sob  rec Please remember to go to the lab  department downstairs for your tests - we will call you with the results when they are available. 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 Follow-up    Coughing more and having more PND. Cough is non prod. She is not using her albuterol inhaler.   after stirs has  lots of nonproductive coughing assoc with sense of pnds  no better on anoro,  Not limited by breathing from desired activities   rec Stop anoro and montelukast to see what difference this makes For drainage / throat tickle try take CHLORPHENIRAMINE  4 mg - take one every 4 hours as needed -  available over the counter-   - late add:  pred x 6 days prn flare x one - consider adding gabapentin next ov    09/26/2016  f/u ov/[name] re: GOLD I copd [name] / no worse breathing or coughing off anoro/montelukast but maint on nasacort  Chief Complaint  Patient presents with  . Follow-up    Breathing is doing well and she is not coughing much. No new co's.  doe = MMRC1 = can walk nl pace, flat grade, can't hurry or go uphills or steps s sob   Only a little drainage p one chlorpheniramine in am and double strength at hs and sleep fine now rec Continue protonix and pepcid for another 2 months then fine to try to taper  off by stopping the protonix first and changing the pepcid 20 mg twice daily then taper the pepcid off as well and see if cough flares or not - if does flare, restart and you may need GI evaluation  For drainage / throat tickle try take CHLORPHENIRAMINE  4 mg - take one every 4 hours as needed - available over the counter- may cause drowsiness so start with just a bedtime dose or two and see  how you tolerate it before trying in daytime   If not satisfied we start you on gabapentin 100 mg three times daily     10/08/17  Excisional bx LUL c/w nec granuloma      12/03/2017 acute extended ov/[name] re: re-establish  sob gradually downhill x one year then much worse since June 2019 Chief Complaint  Patient presents with  . Acute Visit    Increased cough since June- prod but unsure sputum color. She states her breathing has been gradually worse since her last visit May 2019.   still using chlorpheniramine  And still sleeping fine but wakes up feeling very congested at usual hour and then all during the day has same sensation but not coughing up excess/purulent mucus Doe gradually worse x one year  = MMRC2 = can't walk a nl pace on a flat grade s sob but does fine slow and flat  Arthritis good control on arava  rec For drainage / throat tickle try take CHLORPHENIRAMINE  4 mg - take  one every 4 hours as needed - available over the counter- may cause drowsiness so start with just a bedtime dose or two and see how you tolerate it before trying in daytime   Pantoprazole (protonix) 40 mg   Take  30-60 min before first meal of the day and Pepcid (famotidine)  20 mg one @  bedtime until return to office - this is the best way to tell whether stomach acid is contributing to your problem.   GERD diet  Pl   Add if cough/sob worse > Prednisone 10 mg take  4 each am x 2 days,   2 each am x 2 days,  1 each am x 2 days and stop       01/14/2018  f/u ov/[name] re:  GOLD I copd with reversible component . Did not take the prednisone  Chief Complaint  Patient presents with  . Follow-up    PFT's done today. Her cough and SOB are about the same. No new co's.   Dyspnea:  MMRC1 = can walk nl pace, flat grade, can't hurry or go uphills or steps s sob   Cough: more of throat clearing/ daytime x years / p  rx fosfamax >>  worse since then  Sleeping: flat bed / one pillow SABA use: none 02: none  rec Start BREO 440 one click each am x 2 drags each am  Try off fosfamax for now  Please schedule a follow up office visit in 6 weeks, call sooner if needed        02/25/2018  f/u ov/[name] re: COPD GOLD 1/ maint breo and no fosfamax since last ov  Chief Complaint  Patient presents with  . Follow-up    Breathing is unchanged. She does not have a rescue inhaler.   Dyspnea:   MMRC1 = can walk nl pace, flat grade, can't hurry or go uphills or steps s sob   Cough: no longer  Coughing/ not  Much throat clearing  Sleeping: bed is flat/ one pillow rec Ok to start back on fosfamax with no other changes for now to see what difference it make but if upper airway symptoms are worse you will need alternative (Recast/ prolia)  Remember to brush your teeth and tongue with [employer] based toothpaste and if not better we need to see you back for a different inhaler (you will need to bring your drug  formulary with you )  04/30/2018  f/u ov/[name] re:  Copd GOLD I/ maint breo/ back on fosfamax > more throat clearing despite rx for gerd Chief Complaint  Patient presents with  . Follow-up    Breathing is unchanged. No new co's. She does not have a rescue inhaler.   Dyspnea:  Still mmrc1  Cough: min throat clearing worse on fosfamax  Sleeping: bed is flat/ one or two pillows SABA use: none  02: none  No obvious day to day or daytime variability or assoc excess/ purulent sputum or mucus plugs or hemoptysis or cp or chest tightness, subjective wheeze or overt sinus or hb symptoms.   Sleeping as above  without nocturnal  or early am exacerbation  of respiratory  c/o's or need for noct saba. Also denies any obvious fluctuation of symptoms with weather or environmental changes or other aggravating or alleviating factors except as outlined above   No unusual exposure hx or h/o childhood pna/ asthma or knowledge of premature birth.  Current Allergies, Complete Past Medical History, Past Surgical History, Family History, and Social History were reviewed in [employer] record.  ROS  The following are not active complaints unless bolded Hoarseness, sore throat, dysphagia, dental problems, itching, sneezing,  nasal congestion or discharge of excess mucus or purulent secretions, ear ache,   fever, chills, sweats, unintended wt loss or wt gain, classically pleuritic or exertional cp,  orthopnea pnd or arm/hand swelling  or leg swelling, presyncope, palpitations, abdominal pain, anorexia, nausea, vomiting, diarrhea  or change in bowel habits or change in bladder habits, change in stools or change in urine, dysuria, hematuria,  rash, arthralgias, visual complaints, headache, numbness, weakness or ataxia or problems with walking or coordination,  change in mood or  memory.        Current Meds  Medication Sig  . acetaminophen (TYLENOL) 500 MG tablet Take 500 mg by mouth every 6  (six) hours as needed.  . Alendronate Sodium (FOSAMAX PO) Take by mouth.  [name] atorvastatin (LIPITOR) 80 MG tablet TAKE 1 TABLET BY MOUTH EVERY DAY  . CHLORPHENIRAMINE-ACETAMINOPHEN PO Take 1 tablet by mouth 2 (two) times daily.  . clopidogrel (PLAVIX) 75 MG tablet TAKE 1 TABLET BY MOUTH  DAILY  . famotidine (PEPCID) 20 MG tablet One at bedtime  . fluticasone furoate-vilanterol (BREO ELLIPTA) 100-25 MCG/INH AEPB Inhale 1 puff into the lungs daily.  [name] leflunomide (ARAVA) 20 MG tablet Take 20 mg by mouth every other day.   . levothyroxine (SYNTHROID, LEVOTHROID) 88 MCG tablet Take 1 tablet (88 mcg total) by mouth daily before breakfast.  . loperamide (IMODIUM A-D) 2 MG tablet Take 2 mg by mouth 4 (four) times daily as needed for diarrhea or loose stools.  [name] losartan (COZAAR) 25 MG tablet TAKE 1 TABLET BY MOUTH  DAILY  . metoprolol succinate (TOPROL-XL) 25 MG 24 hr tablet TAKE 1 TABLET BY MOUTH  DAILY  . pantoprazole (PROTONIX) 40 MG tablet Take 1 tablet (40 mg total) by mouth daily. Take 30-60 min before first meal of the day  . psyllium (METAMUCIL) 58.6 % packet Take 1 packet by mouth daily.  [name] triamcinolone (NASACORT ALLERGY 24HR) 55 MCG/ACT AERO nasal inhaler Place 1 spray into the nose daily.              Objective:   Physical Exam  amb wf nad   04/30/2018      148 01/14/2018        144  12/03/2017  147  09/26/2016        141  08/07/2016          146  06/19/2016        152  05/22/2016        148   02/15/15 155 lb 6.4 oz (70.489 kg)  01/29/15 154 lb (69.854 kg)  11/26/14 153 lb 8 oz (69.627 kg)     Vital signs reviewed - Note on arrival 02 sats  100% on RA      HEENT: nl dentition / oropharynx. Nl external ear canals without cough reflex -  Mild bilateral non-specific turbinate edema     NECK :  without JVD/Nodes/TM/ nl carotid upstrokes bilaterally   LUNGS: no acc muscle use,  Mild barrel  contour chest wall with bilateral  Distant bs s audible wheeze and  without cough  on insp or exp maneuver and mild  Hyperresonant  to  percussion bilaterally     CV:  RRR  no s3 or murmur or increase in P2, and no edema   ABD:  soft and nontender with pos late  insp Hoover's  in the supine position. No bruits or organomegaly appreciated, bowel sounds nl  MS:   Nl gait/  ext warm without deformities, calf tenderness, cyanosis or clubbing No obvious joint restrictions   SKIN: warm and dry without lesions    NEURO:  alert, approp, nl sensorium with  no motor or cerebellar deficits apparent.           Assessment & Plan:

## 2018-05-01 ENCOUNTER — Encounter: Payer: Self-pay | Admitting: Cardiothoracic Surgery

## 2018-05-01 ENCOUNTER — Encounter: Payer: Self-pay | Admitting: Internal Medicine

## 2018-05-01 NOTE — Assessment & Plan Note (Signed)
Onset around 2008 - Allergy profile 05/22/2016 >  Eos 0.3 /  IgE  10 neg rast - singulair / gerd rx started 05/22/2016 >>> still with pnds 06/19/2016 > add dymista / 1st gen H1> no better but never took h1 so rec stop dymista  - no better 08/07/2016 on singulair/ anoro > rec stop both and just use prn saba/ h1 > improved 09/26/2016  - restarted gerd rx and 1st gen H1 blockers per guidelines  12/03/2017   - rec d/c fosfamax x 6 weeks 01/14/2018 > resolved 02/25/2018 so ok to rechallenge with fosfamax 02/25/2018 and flared again at ov 04/30/2018 so rec substitute prolia or Reclast    Upper airway cough syndrome (previously labeled PNDS),  is so named because it's frequently impossible to sort out how much is  CR/sinusitis with freq throat clearing (which can be related to primary GERD)   vs  causing  secondary (" extra esophageal")  GERD from wide swings in gastric pressure that occur with throat clearing, often  promoting self use of mint and menthol lozenges that reduce the lower esophageal sphincter tone and exacerbate the problem further in a cyclical fashion.   These are the same pts (now being labeled as having "irritable larynx syndrome" by some cough centers) who not infrequently have a history of having failed to tolerate ace inhibitors,  dry powder inhalers(like Breo) or biphosphonates orally like fosfamax  or report having atypical/extraesophageal reflux symptoms that don't respond to standard doses of PPI  and are easily confused as having aecopd or asthma flares by even experienced allergists/ pulmonologists (myself included).

## 2018-05-01 NOTE — Assessment & Plan Note (Signed)
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 This measures up to 2.2 cm.  Stable left lower lobe pulmonary nodule. Borderline and mildly enlarged mediastinal lymph nodes > f/u Dr Servando Snare:    10/08/17  Excisional bx LUL c/w nec granuloma    She tells me has been advised to have CT bx of yet another pulmonary nodule by Dr Servando Snare and if also granuloma with neg cultures would just follow on plain film going forward looking for "macroscopic nodules" or symptoms related to those macroscopic changes  - if cultures pos refer to ID   I had an extended discussion with the patient reviewing all relevant studies completed to date and  lasting 15 to 20 minutes of a 25 minute visit    Each maintenance medication was reviewed in detail including most importantly the difference between maintenance and prns and under what circumstances the prns are to be triggered using an action plan format that is not reflected in the computer generated alphabetically organized AVS.     Please see AVS for specific instructions unique to this visit that I personally wrote and verbalized to the the pt in detail and then reviewed with pt  by my nurse highlighting any  changes in therapy recommended at today's visit to their plan of care.

## 2018-05-01 NOTE — Assessment & Plan Note (Signed)
Quit smoking in her 20s - PFT's  02/19/15   FEV1 1.78 (90 % ) ratio 66  p 12 % improvement from saba p nothing  prior to study with DLCO  71 % corrects to 78 % for alv volume  And classical curvature  - Spirometry 06/19/2016  FEV1 1.65  (84%)  Ratio 69  With no prior rx  - PFT's  01/14/2018  FEV1 1.68 (90 % ) ratio 68  p 18 % improvement from saba p nothing  prior to study with DLCO  84 % corrects to 90  % for alv volume  - 01/14/2018  After extensive coaching inhaler device,  effectiveness =    90% with elipta > try BREO 100 each am      She is so mild I'm not really sure she even needs BREO now but says has 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

## 2018-05-03 ENCOUNTER — Other Ambulatory Visit: Payer: Self-pay | Admitting: *Deleted

## 2018-05-03 DIAGNOSIS — R911 Solitary pulmonary nodule: Secondary | ICD-10-CM

## 2018-05-17 ENCOUNTER — Other Ambulatory Visit: Payer: Self-pay | Admitting: Radiology

## 2018-05-19 ENCOUNTER — Other Ambulatory Visit: Payer: Self-pay | Admitting: Internal Medicine

## 2018-05-20 ENCOUNTER — Ambulatory Visit (HOSPITAL_COMMUNITY)
Admission: RE | Admit: 2018-05-20 | Discharge: 2018-05-20 | Disposition: A | Discharge status: Home / Self Care | Payer: Medicare Other | Source: Ambulatory Visit | Attending: Cardiothoracic Surgery | Admitting: Cardiothoracic Surgery

## 2018-05-20 ENCOUNTER — Other Ambulatory Visit: Payer: Self-pay

## 2018-05-20 ENCOUNTER — Ambulatory Visit (HOSPITAL_COMMUNITY)
Admission: RE | Admit: 2018-05-20 | Discharge: 2018-05-20 | Disposition: A | Discharge status: Home / Self Care | Payer: Medicare Other | Source: Ambulatory Visit | Attending: Diagnostic Radiology | Admitting: Diagnostic Radiology

## 2018-05-20 ENCOUNTER — Encounter (HOSPITAL_COMMUNITY): Payer: Self-pay

## 2018-05-20 DIAGNOSIS — I129 Hypertensive chronic kidney disease with stage 1 through stage 4 chronic kidney disease, or unspecified chronic kidney disease: Secondary | ICD-10-CM | POA: Diagnosis not present

## 2018-05-20 DIAGNOSIS — Z833 Family history of diabetes mellitus: Secondary | ICD-10-CM | POA: Diagnosis not present

## 2018-05-20 DIAGNOSIS — M069 Rheumatoid arthritis, unspecified: Secondary | ICD-10-CM | POA: Insufficient documentation

## 2018-05-20 DIAGNOSIS — Z87891 Personal history of nicotine dependence: Secondary | ICD-10-CM | POA: Diagnosis not present

## 2018-05-20 DIAGNOSIS — E039 Hypothyroidism, unspecified: Secondary | ICD-10-CM | POA: Diagnosis not present

## 2018-05-20 DIAGNOSIS — Z7902 Long term (current) use of antithrombotics/antiplatelets: Secondary | ICD-10-CM | POA: Diagnosis not present

## 2018-05-20 DIAGNOSIS — I252 Old myocardial infarction: Secondary | ICD-10-CM | POA: Insufficient documentation

## 2018-05-20 DIAGNOSIS — Z886 Allergy status to analgesic agent status: Secondary | ICD-10-CM | POA: Diagnosis not present

## 2018-05-20 DIAGNOSIS — N189 Chronic kidney disease, unspecified: Secondary | ICD-10-CM | POA: Insufficient documentation

## 2018-05-20 DIAGNOSIS — Z885 Allergy status to narcotic agent status: Secondary | ICD-10-CM | POA: Diagnosis not present

## 2018-05-20 DIAGNOSIS — Z79899 Other long term (current) drug therapy: Secondary | ICD-10-CM | POA: Diagnosis not present

## 2018-05-20 DIAGNOSIS — I700 Atherosclerosis of aorta: Secondary | ICD-10-CM | POA: Insufficient documentation

## 2018-05-20 DIAGNOSIS — R911 Solitary pulmonary nodule: Secondary | ICD-10-CM | POA: Insufficient documentation

## 2018-05-20 DIAGNOSIS — Z79890 Hormone replacement therapy (postmenopausal): Secondary | ICD-10-CM | POA: Diagnosis not present

## 2018-05-20 DIAGNOSIS — Z951 Presence of aortocoronary bypass graft: Secondary | ICD-10-CM | POA: Diagnosis not present

## 2018-05-20 DIAGNOSIS — I2510 Atherosclerotic heart disease of native coronary artery without angina pectoris: Secondary | ICD-10-CM | POA: Diagnosis not present

## 2018-05-20 DIAGNOSIS — J449 Chronic obstructive pulmonary disease, unspecified: Secondary | ICD-10-CM | POA: Diagnosis not present

## 2018-05-20 DIAGNOSIS — I714 Abdominal aortic aneurysm, without rupture: Secondary | ICD-10-CM | POA: Insufficient documentation

## 2018-05-20 DIAGNOSIS — M353 Polymyalgia rheumatica: Secondary | ICD-10-CM | POA: Insufficient documentation

## 2018-05-20 DIAGNOSIS — Z8673 Personal history of transient ischemic attack (TIA), and cerebral infarction without residual deficits: Secondary | ICD-10-CM | POA: Insufficient documentation

## 2018-05-20 DIAGNOSIS — Z9889 Other specified postprocedural states: Secondary | ICD-10-CM

## 2018-05-20 DIAGNOSIS — Z8249 Family history of ischemic heart disease and other diseases of the circulatory system: Secondary | ICD-10-CM | POA: Insufficient documentation

## 2018-05-20 DIAGNOSIS — E785 Hyperlipidemia, unspecified: Secondary | ICD-10-CM | POA: Diagnosis not present

## 2018-05-20 LAB — PROTIME-INR
INR: 0.89
Prothrombin Time: 12 seconds (ref 11.4–15.2)

## 2018-05-20 LAB — CBC
HCT: 38.5 % (ref 36.0–46.0)
Hemoglobin: 12.2 g/dL (ref 12.0–15.0)
MCH: 30.3 pg (ref 26.0–34.0)
MCHC: 31.7 g/dL (ref 30.0–36.0)
MCV: 95.5 fL (ref 80.0–100.0)
Platelets: 346 10*3/uL (ref 150–400)
RBC: 4.03 MIL/uL (ref 3.87–5.11)
RDW: 15.6 % — ABNORMAL HIGH (ref 11.5–15.5)
WBC: 8.9 10*3/uL (ref 4.0–10.5)
nRBC: 0 % (ref 0.0–0.2)

## 2018-05-20 MED ORDER — FENTANYL CITRATE (PF) 100 MCG/2ML IJ SOLN
INTRAMUSCULAR | Status: AC
  Filled 2018-05-20: qty 2

## 2018-05-20 MED ORDER — FENTANYL CITRATE (PF) 100 MCG/2ML IJ SOLN
INTRAMUSCULAR | Status: AC | PRN
  Administered 2018-05-20 (×2): 25 ug via INTRAVENOUS

## 2018-05-20 MED ORDER — MIDAZOLAM HCL 2 MG/2ML IJ SOLN
INTRAMUSCULAR | Status: AC | PRN
  Administered 2018-05-20: 1 mg via INTRAVENOUS

## 2018-05-20 MED ORDER — SODIUM CHLORIDE 0.9 % IV SOLN: INTRAVENOUS | Status: DC

## 2018-05-20 MED ORDER — LIDOCAINE HCL 1 % IJ SOLN
INTRAMUSCULAR | Status: AC
  Filled 2018-05-20: qty 20

## 2018-05-20 MED ORDER — MIDAZOLAM HCL 2 MG/2ML IJ SOLN
INTRAMUSCULAR | Status: AC
  Filled 2018-05-20: qty 2

## 2018-05-20 NOTE — H&P (Signed)
Chief Complaint: Patient was seen in consultation today for left lung nodule biopsy at the request of Gerhardt,Edward B  Referring Physician(s): Grace Isaac  Supervising Physician: Markus Daft  Patient Status: [facility] - Out-pt  History of Present Illness: [name] is a 78 y.o. female   Enlarging Left pulmonary nodule CT 04/04/18:  Pulmonary nodule within the LEFT lower lobe measures 1.7 by 1.6 cm, image 135/8. Previously 1.2 x 1.2 cm.  Dr Servando Snare note 04/04/18:  Assessment / Plan:   Previous history of resection of a left upper lobe nodule that was found to be inflammatory in nature without malignancy.  Follow-up CT scan follow-up scans have shown Interval development of 11 mm nodule in left lower lobe  And  new 5 mm nodule in left lung apex Scan today shows slight enlargement of the nodules over a short interval 3 months, the scan was done as a super D format. Cytologies and pathology of biopsies both to the left upper lobe and left lower lobe from 09/2017 were  were negative for malignancy, initial smears suggested granulomas but final report did not mention this.  Patient comes in today with a follow-up CT scan.  Scan shows very slight enlargement of the lesion in the left lower lobe, adjacent to the diaphragm.  I discussed with the patient, consideration for CT-guided needle biopsy or resection.  Pt denies cough or pain or SOB Nonsmoker LD Plavix 05/16/18  Now scheduled for biopsy of LLL nodule   Past Medical History:  Diagnosis Date  . Abdominal aortic aneurysm (HCC)    REPAIRED IN 1996 BY [name]  AND HAS RECENTLY BEEN FOLLOWED BY [name]  . 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 Adenomatous colon polyp    tubular  . Anemia   . CAD in native artery 1996, 2002, 2005    Status post CABG x1 with LIMA-LAD for ostial LAD 90% stenosis --> down to 50% in 2002 and 30% in 2005.;  Atretic  LIMA; Myoview 06/2010: Fixed anteroseptal, apical and inferoapical defect with moderate size. Most likely scar. Mild subendocardial ischemia. EF 71% LOW RISK.   [name] Cervical disc disease   . Chronic kidney disease   . COPD (chronic obstructive pulmonary disease) ([place])   . Diverticulosis   . Hyperlipidemia   . Hypertension   . Hypothyroidism (acquired)    hypo  . Myocardial infarction [facility]) Aug. 2016   TIA  . Polymyalgia rheumatica (HCC)    2011 Dr. Charlestine Night  . Rheumatoid arthritis ([place]) 2011   Dr.Truslow; fracture knees, hands and wrists -   . S/P CABG x 1 1996   CABG--LIMA-LAD for ostial LAD (not felt to be PCI amenable). EF NORMAL then; LIMA now atretic  . Shortness of breath dyspnea    with exertion  . Stroke [facility]) 11-2014   TIA   . Urinary frequency     Past Surgical History:  Procedure Laterality Date  . 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 APPENDECTOMY    . BUNIONECTOMY  07/2011   right foot  . CARDIAC CATHETERIZATION  2005   (Most recent CATH) - ostial LAD lesion 20-30% (down from 90% initially). Atretic LIMA. Minimal disease the RCA and Circumflex system.  [name] CARPAL TUNNEL RELEASE Left   . CATARACT EXTRACTION Bilateral   . CERVICAL SPINE  SURGERY     plate 2008 Dr. Saintclair Halsted  . [place]   INCLUDED AN INTERNAL MAMMARY ARTERY TO THE LAD. EF WAS NORMAL  . INGUINAL HERNIA REPAIR Right   . LAPAROSCOPIC APPENDECTOMY N/A 06/28/2016   Procedure: APPENDECTOMY LAPAROSCOPIC;  Surgeon: Leighton Ruff, MD;  Location: [facility] ORS;  Service: General;  Laterality: N/A;  . NM MYOVIEW LTD  March 2012   Fixed anteroseptal, apical and inferoapical defect with moderate size. Most likely scar. Mild subendocardial ischemia. EF 71% LOW RISK.   [name] SPLENECTOMY    . TRANSTHORACIC ECHOCARDIOGRAM  12/2014   [facility]: Normal LV size & function. EF 55-60%,   . vagina polyp     . VIDEO ASSISTED THORACOSCOPY (VATS)/WEDGE RESECTION Left 03/02/2015   Procedure: VIDEO ASSISTED THORACOSCOPY (VATS), MINI THORACOTOMY, LEFT UPPER LOBE WEDGE, TAKE DOWN OF INTERNAL MAMMARY LESIONS, PLACEMENT OF ON-Q PUMP;  Surgeon: Grace Isaac, MD;  Location: [place];  Service: Thoracic;  Laterality: Left;  [name] VIDEO BRONCHOSCOPY N/A 03/02/2015   Procedure: BRONCHOSCOPY;  Surgeon: Grace Isaac, MD;  Location: [place];  Service: Thoracic;  Laterality: N/A;  . VIDEO BRONCHOSCOPY WITH ENDOBRONCHIAL NAVIGATION N/A 10/08/2017   Procedure: VIDEO BRONCHOSCOPY WITH ENDOBRONCHIAL NAVIGATION WITH BIOPSIES OF LEFT UPPER LOBE AND LEFT LOWER LOBE;  Surgeon: Grace Isaac, MD;  Location: [facility] OR;  Service: Thoracic;  Laterality: N/A;    Allergies: Aspirin; Codeine; and Nitroglycerin  Medications: Prior to Admission medications   Medication Sig Start Date End Date Taking? Authorizing Provider  acetaminophen (TYLENOL) 500 MG tablet Take 500 mg by mouth every 6 (six) hours as needed (pain).    Yes [provider]  atorvastatin (LIPITOR) 80 MG tablet TAKE 1 TABLET BY MOUTH EVERY DAY 03/08/18  Yes Plotnikov, [name], MD  CHLORPHENIRAMINE-ACETAMINOPHEN PO Take 1 tablet by mouth 2 (two) times daily.   Yes [provider]  clopidogrel (PLAVIX) 75 MG tablet TAKE 1 TABLET BY MOUTH  DAILY 03/18/18  Yes Plotnikov, [name], MD  famotidine (PEPCID) 20 MG tablet One at bedtime Patient taking differently: Take 20 mg by mouth daily as needed for heartburn.  12/03/17  Yes Tanda Rockers, MD  fluticasone furoate-vilanterol (BREO ELLIPTA) 100-25 MCG/INH AEPB Inhale 1 puff into the lungs daily. 01/14/18  Yes Tanda Rockers, MD  Investigational - Study Medication Take 3 capsules by mouth daily. Study name: Women's Health Initiative Study COSMOS  Additional study details: 1 cap is Calcium, 2 caps are Coco extract   Yes [provider]  leflunomide (ARAVA) 20 MG tablet Take 20 mg by mouth every  other day.    Yes [provider]  levothyroxine (SYNTHROID, LEVOTHROID) 88 MCG tablet Take 1 tablet (88 mcg total) by mouth daily before breakfast. 02/22/17  Yes Plotnikov, [name], MD  loperamide (IMODIUM A-D) 2 MG tablet Take 2 mg by mouth 4 (four) times daily as needed for diarrhea or loose stools.   Yes [provider]  losartan (COZAAR) 25 MG tablet TAKE 1 TABLET BY MOUTH  DAILY 03/18/18  Yes Plotnikov, [name], MD  metoprolol succinate (TOPROL-XL) 25 MG 24 hr tablet TAKE 1 TABLET BY MOUTH  DAILY 03/18/18  Yes Plotnikov, [name], MD  pantoprazole (PROTONIX) 40 MG tablet Take 1 tablet (40 mg total) by mouth daily. Take 30-60 min before first meal of the day 02/25/18  Yes Tanda Rockers, MD  psyllium (METAMUCIL) 58.6 % packet Take 1 packet by mouth daily.  Yes [provider]  triamcinolone (NASACORT ALLERGY 24HR) 55 MCG/ACT AERO nasal inhaler Place 1 spray into the nose daily.    Yes [provider]     Family History  Problem Relation Age of Onset  . Hypertension Mother   . Diabetes Mother   . Heart disease Mother   . Hyperlipidemia Mother   . Stroke Father   . Hyperlipidemia Sister   . Hypertension Sister   . Hypertension Daughter   . Cancer Paternal Uncle        Deceased from cancer not sure of site  . Hypertension Son   . Hyperlipidemia Son   . Hyperlipidemia Son   . Hypertension Son   . Hyperlipidemia Son   . Hypertension Son   . Hypertension Unknown   . Coronary artery disease Unknown   . Asthma Neg Hx   . Colon cancer Neg Hx     Social History   Socioeconomic History  . Marital status: Married    Spouse name: Not on file  . Number of children: 4  . Years of education: Not on file  . Highest education level: Not on file  Occupational History  . Occupation: retired    Fish farm manager: RETIRED  Social Needs  . Financial resource strain: Not hard at all  . Food insecurity:    Worry: Never true    Inability: Never true  .  Transportation needs:    Medical: No    Non-medical: No  Tobacco Use  . Smoking status: Former Smoker    Last attempt to quit: 12/04/1958    Years since quitting: 59.4  . Smokeless tobacco: Never Used  Substance and Sexual Activity  . Alcohol use: No  . Drug use: No  . Sexual activity: Not Currently  Lifestyle  . Physical activity:    Days per week: 5 days    Minutes per session: 40 min  . Stress: Not at all  Relationships  . Social connections:    Talks on phone: More than three times a week    Gets together: More than three times a week    Attends religious service: 1 to 4 times per year    Active member of club or organization: Yes    Attends meetings of clubs or organizations: More than 4 times per year    Relationship status: Married  Other Topics Concern  . Not on file  Social History Narrative   [facility]  [place] ; [place]. GOES TO [place] 3 DAYS A WEEK FOR ABOUT 45 MIN. PER SESSION      Living at [facility] since dec 2015     Review of Systems: A 12 point ROS discussed and pertinent positives are indicated in the HPI above.  All other systems are negative.  Review of Systems  Constitutional: Negative for activity change, fatigue, fever and unexpected weight change.  Respiratory: Negative for cough and shortness of breath.   Cardiovascular: Negative for chest pain.  Gastrointestinal: Negative for abdominal pain.  Musculoskeletal: Negative for back pain.  Neurological: Negative for weakness.  Psychiatric/Behavioral: Negative for behavioral problems and confusion.    Vital Signs: BP (!) 145/57   Pulse 63   Temp (!) 97.5 F (36.4 C)   Resp 18   Ht 5\' 3"  (1.6 m)   Wt 145 lb (65.8 kg)   SpO2 95%   BMI 25.69 kg/m   Physical Exam Vitals signs reviewed.  Constitutional:  Appearance: Normal appearance.  Cardiovascular:     Rate and Rhythm: Normal rate and regular rhythm.     Heart sounds:  Normal heart sounds.  Pulmonary:     Effort: Pulmonary effort is normal.     Breath sounds: Normal breath sounds.  Abdominal:     Palpations: Abdomen is soft.  Musculoskeletal: Normal range of motion.  Skin:    General: Skin is warm and dry.  Neurological:     General: No focal deficit present.     Mental Status: She is alert.  Psychiatric:        Mood and Affect: Mood normal.        Behavior: Behavior normal.        Thought Content: Thought content normal.        Judgment: Judgment normal.     Imaging: No results found.  Labs:  CBC: Recent Labs    08/24/17 0754 10/08/17 0914 12/03/17 1005 05/20/18 0917  WBC 8.1 10.2 8.2 8.9  HGB 13.9 13.1 13.5 12.2  HCT 42.0 40.7 40.2 38.5  PLT 333.0 348 353.0 346    COAGS: Recent Labs    10/08/17 0914 05/20/18 0917  INR 0.98 0.89  APTT 27  --     BMP: Recent Labs    08/24/17 0754 10/08/17 0914 12/03/17 1005  NA 141 142 136  K 4.8 4.6 5.2*  CL 103 107 104  CO2 27 25 23   GLUCOSE 90 93 108*  BUN 30* 33* 37*  CALCIUM 9.8 9.8 9.9  CREATININE 1.35* 1.53* 1.54*  GFRNONAA  --  31*  --   GFRAA  --  36*  --     LIVER FUNCTION TESTS: Recent Labs    08/24/17 0754 10/08/17 0914  BILITOT 0.6 1.0  AST 22 28  ALT 23 27  ALKPHOS 105 99  PROT 6.8 6.3*  ALBUMIN 4.1 3.7    TUMOR MARKERS: No results for input(s): AFPTM, CEA, CA199, CHROMGRNA in the last 8760 hours.  Assessment and Plan:  Left lung nodule; enlarging +PET 06/2017 Followed by Dr Servando Snare Scheduled for biopsy today Risks and benefits discussed with the patient including, but not limited to bleeding, hemoptysis, respiratory failure requiring intubation, infection, pneumothorax requiring chest tube placement, stroke from air embolism or even death.  All of the patient's questions were answered, patient is agreeable to proceed. Consent signed and in chart.   Thank you for this interesting consult.  I greatly enjoyed meeting DANELIA SNODGRASS and look  forward to participating in their care.  A copy of this report was sent to the requesting provider on this date.  Electronically Signed: Lavonia Drafts, PA-C 05/20/2018, 10:14 AM   I spent a total of  30 Minutes   in face to face in clinical consultation, greater than 50% of which was counseling/coordinating care for left lung nodule biopsy

## 2018-05-20 NOTE — Discharge Instructions (Signed)
Needle Biopsy of the Lung, Care After °This sheet gives you information about how to care for yourself after your procedure. Your health care provider may also give you more specific instructions. If you have problems or questions, contact your health care provider. °What can I expect after the procedure? °After the procedure, it is common to have: °· Soreness, pain, and tenderness where a tissue sample was taken (biopsy site). °· A cough. °· A sore throat. °Follow these instructions at home: °Biopsy site care °· Follow instructions from your health care provider about when to remove the bandage that was placed on the biopsy site. °· Keep the bandage dry until it has been removed. °· Check your biopsy site every day for signs of infection. Check for: °? More redness, swelling, or pain. °? More fluid or blood. °? Warmth to the touch. °? Pus or a bad smell. °General instructions ° °· Rest as directed by your health care provider. Ask your health care provider what activities are safe for you. °· Do not take baths, swim, or use a hot tub until your health care provider approves. °· Take over-the-counter and prescription medicines only as told by your health care provider. °· If you have airplane travel scheduled, talk with your health care provider about when it is safe for you to travel by airplane. °· It is up to you to get the results of your procedure. Ask your health care provider, or the department that is doing the procedure, when your results will be ready. °· Keep all follow-up visits as told by your health care provider. This is important. °Contact a health care provider if: °· You have more redness, swelling, or pain around your biopsy site. °· You have more fluid or blood coming from your biopsy site. °· Your biopsy site feels warm to the touch. °· You have pus or a bad smell coming from your biopsy site. °· You have a fever. °· You have pain that does not get better with medicine. °Get help right away  if: °· You have problems breathing. °· You have chest pain. °· You cough up blood. °· You faint. °· You have a fast heart rate. °Summary °· After a needle biopsy of the lung, it is common to have a cough, a sore throat, or soreness, pain, and tenderness where a tissue sample was taken (biopsy site). °· You should check your biopsy area every day for signs of infection, including pus or a bad smell, warmth, more fluid or blood, or more redness, swelling, or pain. °· You should not take baths, swim, or use a hot tub until your health care provider approves. °· It is up to you to get the results of your procedure. Ask your health care provider, or the department that is doing the procedure, when your results will be ready. °This information is not intended to replace advice given to you by your health care provider. Make sure you discuss any questions you have with your health care provider. °Document Released: 02/12/2007 Document Revised: 03/08/2016 Document Reviewed: 03/08/2016 °Elsevier Interactive Patient Education © 2019 [employer] ° ° ° °Moderate Conscious Sedation, Adult, Care After °These instructions provide you with information about caring for yourself after your procedure. Your health care provider may also give you more specific instructions. Your treatment has been planned according to current medical practices, but problems sometimes occur. Call your health care provider if you have any problems or questions after your procedure. °What can I expect   after the procedure? °After your procedure, it is common: °· To feel sleepy for several hours. °· To feel clumsy and have poor balance for several hours. °· To have poor judgment for several hours. °· To vomit if you eat too soon. °Follow these instructions at home: °For at least 24 hours after the procedure: ° °· Do not: °? Participate in activities where you could fall or become injured. °? Drive. °? Use heavy machinery. °? Drink alcohol. °? Take sleeping  pills or medicines that cause drowsiness. °? Make important decisions or sign legal documents. °? Take care of children on your own. °· Rest. °Eating and drinking °· Follow the diet recommended by your health care provider. °· If you vomit: °? Drink water, juice, or soup when you can drink without vomiting. °? Make sure you have little or no nausea before eating solid foods. °General instructions °· Have a responsible adult stay with you until you are awake and alert. °· Take over-the-counter and prescription medicines only as told by your health care provider. °· If you smoke, do not smoke without supervision. °· Keep all follow-up visits as told by your health care provider. This is important. °Contact a health care provider if: °· You keep feeling nauseous or you keep vomiting. °· You feel light-headed. °· You develop a rash. °· You have a fever. °Get help right away if: °· You have trouble breathing. °This information is not intended to replace advice given to you by your health care provider. Make sure you discuss any questions you have with your health care provider. °Document Released: 02/05/2013 Document Revised: 09/20/2015 Document Reviewed: 08/07/2015 °Elsevier Interactive Patient Education © 2019 [employer] ° ° °

## 2018-05-20 NOTE — Procedures (Signed)
Interventional Radiology Procedure:   Indications: Enlarging left lung nodule  Procedure: CT guided core biopsy of left lower lobe nodule  Findings: 3 cores from nodule.  Negative for PTX after biopsy  Complications: None     EBL: Minimal  Plan: Bedrest 2 hours, CXR in 1 hour.    Gracen Southwell R. Anselm Pancoast, MD  Pager: [phone]

## 2018-06-06 ENCOUNTER — Encounter: Payer: Self-pay | Admitting: Cardiothoracic Surgery

## 2018-06-06 ENCOUNTER — Ambulatory Visit: Payer: Medicare Other | Admitting: Cardiothoracic Surgery

## 2018-06-06 ENCOUNTER — Other Ambulatory Visit: Payer: Self-pay

## 2018-06-06 VITALS — BP 130/80 | HR 81 | Resp 16 | Ht 63.0 in | Wt 145.0 lb

## 2018-06-06 DIAGNOSIS — Z9889 Other specified postprocedural states: Secondary | ICD-10-CM | POA: Diagnosis not present

## 2018-06-06 DIAGNOSIS — R911 Solitary pulmonary nodule: Secondary | ICD-10-CM | POA: Diagnosis not present

## 2018-06-06 NOTE — Progress Notes (Signed)
[address]       [place] [address]             [phone]      [name] [place] Medical Record #[id number] Date of Birth: 08-Apr-1941  Referring: Tanda Rockers, MD Primary Care: Cassandria Anger, MD  Chief Complaint:   POST OP FOLLOW UP 10/08/2017 PREOPERATIVE DIAGNOSIS:  Left lower and left upper lobe lung nodules. POSTOPERATIVE DIAGNOSIS:  Left lower and left upper lobe lung nodules. SURGICAL PROCEDURE:  Video bronchoscopy, electromagnetic navigation bronchoscopy with biopsies of left lower lobe and left upper lobe lung mass. SURGEON:  Lilia Argue. Servando Snare, MD  03/02/2015 OPERATIVE REPORT PREOPERATIVE DIAGNOSIS: Left upper lobe lung mass POSTOPERATIVE DIAGNOSIS: Left upper lobe lung mass. 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 PROCEDURE PERFORMED: Video bronchoscopy, left video-assisted thoracoscopy, minithoracotomy, wedge resection of left upper lobe. SURGEON: Lanelle Bal, M.D Previous left VATS lung resection Lung, wedge biopsy/resection, left upper lobe - NECROTIZING GRANULOMA, 1.7 CM. - NO TUMOR SEEN. - SEE COMMENT. Microscopic Comment Sections demonstrate a 1.7 cm necrotizing granuloma. There is associated reactive pneumocyte hyperplasia with reactive atypia but no true tumor is identified. An AFB stain is performed on a representative section which is negative for acid fast bacilli. A GMS and PAS stain are also performed on a representative section which are both negative for fungal organisms. Although specimen stains for microorganisms are negative, stains are not as sensitive as other microbiologic techniques including culture. Please correlate with culture results. (RAH:gt, 03/04/15) [name] Pathologist, Electronic Signature (Case signed 03/04/2015  History of Present Illness:     Patient was  admitted to [facility] hospital in December 2017 with respiratory problems  , a CT scan of the chest was  done at that time the report notes recurrent malignancy and old stable line, however the patient has no previous history of lung cancer nor lobectomy.  Repeat ct of chest done with enlarging are left upper lobe pleural based, not at area of previous wedge resection (patient did not have lobectomy as some notes indicate, this was interpreted as recurrent malignancy however the patient previous surgery was not related to malignancy, she has no previous history of lung cancer.  Patient returned last week with a follow-up CT scan, to evaluate a approximately 1 cm nodule in the left lower lobe at the diaphragm, and multiple other smaller nodes in the left and right lung.    CT directed needle biopsy was done May 20, 2018, the final result of this biopsy noted below but again was inflammatory in nature  Since last seen she has had no new symptoms no cough fever chills.  She does note some increasing breathlessness especially with climbing stairs, she notes she is able to climb 1 flight of stairs without difficulty but has to stop and rest if she goes 2 flights.    She did smoke for 5-7 years but quit at age 78.  Past Medical History:  Diagnosis Date  . Abdominal aortic aneurysm (HCC)    REPAIRED IN 1996 BY [name]  AND HAS RECENTLY BEEN FOLLOWED BY [name]  . 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 Adenomatous colon polyp    tubular  . Anemia   . CAD in native artery 1996, 2002, 2005    Status post CABG x1 with LIMA-LAD for ostial LAD 90% stenosis -->  down to 50% in 2002 and 30% in 2005.;  Atretic LIMA; Myoview 06/2010: Fixed anteroseptal, apical and inferoapical defect with moderate size. Most likely scar. Mild subendocardial ischemia. EF 71% LOW RISK.   [name] Cervical disc disease   . Chronic kidney disease   . COPD (chronic obstructive pulmonary disease) ([place])   . Diverticulosis   . Hyperlipidemia   . Hypertension     . Hypothyroidism (acquired)    hypo  . Myocardial infarction [facility]) Aug. 2016   TIA  . Polymyalgia rheumatica (HCC)    2011 Dr. Charlestine Night  . Rheumatoid arthritis ([place]) 2011   Dr.Truslow; fracture knees, hands and wrists -   . S/P CABG x 1 1996   CABG--LIMA-LAD for ostial LAD (not felt to be PCI amenable). EF NORMAL then; LIMA now atretic  . Shortness of breath dyspnea    with exertion  . Stroke [facility]) 11-2014   TIA   . Urinary frequency      Social History   Tobacco Use  Smoking Status Former Smoker  . Last attempt to quit: 12/04/1958  . Years since quitting: 59.5  Smokeless Tobacco Never Used    Social History   Substance and Sexual Activity  Alcohol Use No     Allergies  Allergen Reactions  . Aspirin Other (See Comments)    Hurts stomach  . Codeine Nausea And Vomiting  . Nitroglycerin Other (See Comments)    Heart rate drops    Current Outpatient Medications  Medication Sig Dispense Refill  . acetaminophen (TYLENOL) 500 MG tablet Take 500 mg by mouth every 6 (six) hours as needed (pain).     [name] atorvastatin (LIPITOR) 80 MG tablet TAKE 1 TABLET BY MOUTH EVERY DAY 90 tablet 3  . CHLORPHENIRAMINE-ACETAMINOPHEN PO Take 1 tablet by mouth 2 (two) times daily.    . clopidogrel (PLAVIX) 75 MG tablet TAKE 1 TABLET BY MOUTH  DAILY 90 tablet 1  . famotidine (PEPCID) 20 MG tablet One at bedtime (Patient taking differently: Take 20 mg by mouth daily as needed for heartburn. ) 30 tablet 11  . fluticasone furoate-vilanterol (BREO ELLIPTA) 100-25 MCG/INH AEPB Inhale 1 puff into the lungs daily. 180 each 3  . Investigational - Study Medication Take 3 capsules by mouth daily. Study name: Women's Health Initiative Study COSMOS  Additional study details: 1 cap is Calcium, 2 caps are Coco extract    . leflunomide (ARAVA) 20 MG tablet Take 20 mg by mouth every other day.     . levothyroxine (SYNTHROID, LEVOTHROID) 88 MCG tablet Take 1 tablet (88 mcg total) by mouth daily before breakfast.  90 tablet 3  . loperamide (IMODIUM A-D) 2 MG tablet Take 2 mg by mouth 4 (four) times daily as needed for diarrhea or loose stools.    [name] losartan (COZAAR) 25 MG tablet TAKE 1 TABLET BY MOUTH  DAILY 90 tablet 1  . metoprolol succinate (TOPROL-XL) 25 MG 24 hr tablet TAKE 1 TABLET BY MOUTH  DAILY 90 tablet 1  . pantoprazole (PROTONIX) 40 MG tablet TAKE 1 TABLET (40 MG TOTAL) BY MOUTH DAILY. TAKE 30-60 MIN BEFORE FIRST MEAL OF THE DAY 90 tablet 0  . psyllium (METAMUCIL) 58.6 % packet Take 1 packet by mouth daily.    [name] triamcinolone (NASACORT ALLERGY 24HR) 55 MCG/ACT AERO nasal inhaler Place 1 spray into the nose daily.      No current facility-administered medications for this visit.    No change in review of systems since last week  Physical Exam: BP 130/80 (BP Location: Left Arm, Patient Position: Sitting, Cuff Size: Normal)   Pulse 81   Resp 16   Ht 5\' 3"  (1.6 m)   Wt 145 lb (65.8 kg)   SpO2 96% Comment: RA  BMI 25.69 kg/m  General appearance: alert, cooperative and no distress Head: Normocephalic, without obvious abnormality, atraumatic Neck: no adenopathy, no carotid bruit, no JVD, supple, symmetrical, trachea midline and thyroid not enlarged, symmetric, no tenderness/mass/nodules Lymph nodes: Cervical, supraclavicular, and axillary nodes normal. Resp: clear to auscultation bilaterally Back: symmetric, no curvature. ROM normal. No CVA tenderness. Cardio: regular rate and rhythm, S1, S2 normal, no murmur, click, rub or gallop GI: soft, non-tender; bowel sounds normal; no masses,  no organomegaly Extremities: extremities normal, atraumatic, no cyanosis or edema Neurologic: Grossly normal   Diagnostic Studies & Laboratory data:     Recent Radiology Findings: Ct Biopsy  Result Date: 05/20/2018 INDICATION: 78 year old with an enlarging left lower lobe pulmonary nodule. Previous pulmonary nodule resection demonstrated a granuloma. EXAM: CT-GUIDED CORE BIOPSY OF LEFT LOWER LOBE  PULMONARY NODULE MEDICATIONS: None. ANESTHESIA/SEDATION: Moderate (conscious) sedation was employed during this procedure. A total of Versed 1.0 mg and Fentanyl 50 mcg was administered intravenously. Moderate Sedation Time: 23 minutes. The patient's level of consciousness and vital signs were monitored continuously by radiology nursing throughout the procedure under my direct supervision. FLUOROSCOPY TIME:  None COMPLICATIONS: None immediate. PROCEDURE: Informed written consent was obtained from the patient after a thorough discussion of the procedural risks, benefits and alternatives. All questions were addressed. A timeout was performed prior to the initiation of the procedure. Patient was placed on her left side. CT images through the lower chest were obtained. Left side of the back was prepped with chlorhexidine and sterile field was created. Maximal barrier sterile technique was utilized including caps, mask, sterile gowns, sterile gloves, sterile drape, hand hygiene and skin antiseptic. Skin was anesthetized with 1% lidocaine. 17 gauge coaxial needle was directed into the nodule using CT guidance. Needle position confirmed in the lesion. Three core biopsies obtained with an 18 gauge core device. Two specimens placed in formalin and one placed in saline. The 17 gauge needle was removed using the BioSentry tract sealant. Bandage placed over the puncture site. FINDINGS: 1.6 cm nodule at the base of the left lower lobe. Biopsy needle was confirmed within the lesion. Three small core biopsies were obtained. Negative for a pneumothorax or significant bleeding following the core biopsies. IMPRESSION: CT-guided core biopsies of the left lower lobe pulmonary nodule. Electronically Signed   By: Markus Daft M.D.   On: 05/20/2018 12:39   Dg Chest Port 1 View  Result Date: 05/20/2018 CLINICAL DATA:  Status post lung biopsy. EXAM: PORTABLE CHEST 1 VIEW COMPARISON:  Two-view chest x-ray 12/03/2017. Procedure CT of the  same day. FINDINGS: Heart is enlarged. CABG changes are noted. Lungs are hyperexpanded. There is no significant pneumothorax. A small left pleural effusion is as expected. IMPRESSION: 1. No significant pneumothorax following left lung biopsy. 2. Aortic atherosclerosis. Electronically Signed   By: San Morelle M.D.   On: 05/20/2018 13:22       Ct Super D Chest Wo Contrast  Result Date: 10/04/2017 CLINICAL DATA:  Lung nodule. EXAM: CT CHEST WITHOUT CONTRAST TECHNIQUE: Multidetector CT imaging of the chest was performed using thin slice collimation for electromagnetic bronchoscopy planning purposes, without intravenous contrast. COMPARISON:  CT chest 06/28/2017. FINDINGS: Cardiovascular: The heart size appears within normal limits. No pericardial effusion. Previous median  sternotomy and CABG procedure. Aortic atherosclerosis noted. Mediastinum/Nodes: Normal appearance of the thyroid gland. The trachea appears patent and is midline. Normal appearance of the esophagus. No enlarged mediastinal or hilar lymph nodes. No axillary or supraclavicular adenopathy. Lungs/Pleura: Postoperative changes involving the left upper lobe are again noted. The index nodule within the left upper lobe measures 7 mm, image 18/8. This is compared with 4 mm previously. The index nodule within the left lower lobe measures 1.2 x 1.2 cm, image 117/8. Previously 1.1 x 0.9 cm. 4 mm right lower lobe lung nodule is noted, image 125/8. Previously this measured the same. Within the medial right lower lobe there is an 8 mm subpleural nodule, image 72/2. Stable from comparison exam. In the right upper lobe there is a small nodule measuring 5 mm, image 43/8. Stable from previous exam. Upper Abdomen: Lobular appearing left kidney noted. The adrenal glands are unremarkable. No acute abnormality noted. Musculoskeletal: Mild degenerative disc disease noted throughout the thoracic spine. No suspicious bone lesions. IMPRESSION: 1. There has been  increase in size of left lower lobe lung nodule compared with 06/28/2017. Small left upper lobe lung nodule is also increased in size. 2. Right lower lobe and right upper lobe lung nodules are stable in the interval. 3.  Aortic Atherosclerosis (ICD10-I70.0). Electronically Signed   By: Kerby Moors M.D.   On: 10/04/2017 10:57       CLINICAL DATA:  Subsequent treatment strategy for lung nodule.  EXAM: NUCLEAR MEDICINE PET SKULL BASE TO THIGH  TECHNIQUE: 7.8 mCi F-18 FDG was injected intravenously. Full-ring PET imaging was performed from the skull base to thigh after the radiotracer. CT data was obtained and used for attenuation correction and anatomic localization.  Fasting blood glucose: 100 mg/dl  Mediastinal blood pool activity: SUV max 3.2  COMPARISON:  CT chest 06/28/2017, 06/08/2016, 04/15/2016, PET 02/10/2015 and CT chest 01/29/2015.  FINDINGS: NECK: No hypermetabolic lymph nodes in the neck.  Incidental CT findings: None.  CHEST: Nodular scarring in the medial left upper lobe does not show abnormal hypermetabolism. A 10 mm round nodule in the inferior left lower lobe has an SUV max 3.4, is stable in size from 06/28/2017 but new from CT chest 06/08/2016. No hypermetabolic mediastinal, hilar or axillary lymph nodes.  Incidental CT findings: Atherosclerotic calcification of the arterial vasculature. Heart is enlarged. No pericardial or pleural effusion. 6 mm apical left upper lobe nodule (series 8, image 11), too small for PET resolution.  ABDOMEN/PELVIS: No abnormal hypermetabolism in the liver, adrenal glands, spleen or pancreas. No hypermetabolic lymph nodes.  Incidental CT findings: Low-attenuation lesions in the kidneys are difficult to further characterize without post-contrast imaging. Splenectomy.  SKELETON: No abnormal osseous hypermetabolism.  Incidental CT findings: Degenerative changes in the spine.  IMPRESSION: 1. Mildly  hypermetabolic left lower lobe nodule, stable in size from 06/28/2017 but new from 06/08/2016. Findings are indeterminate with both malignant and infectious etiologies considered, given the short interval from 06/28/2017. Consider follow-up CT chest in 3-4 weeks in further initial evaluation. 2. 6 mm apical left upper lobe nodule is too small for PET resolution. 3.  Aortic atherosclerosis (ICD10-170.0).   Electronically Signed   By: Lorin Picket M.D.   On: 07/05/2017 09:09   I have independently reviewed the above radiology studies  and reviewed the findings with the patient.   Dg Chest 2 View  Result Date: 04/17/2016 CLINICAL DATA:  Productive cough for the past month. EXAM: CHEST  2 VIEW COMPARISON:  04/15/2016; 07/22/2015;  chest CT - 04/15/2016 FINDINGS: Grossly unchanged cardiac silhouette and mediastinal contours post median sternotomy. Atherosclerotic plaque within the thoracic aorta. The lungs remain hyperexpanded with flattening of the diaphragms and thinning of the biapical pulmonary parenchyma. Known ill-defined soft tissue about the left upper lobe surgical resection site is not well demonstrated on the present examination. No focal airspace opacities. No pleural effusion or pneumothorax. No evidence of edema. IMPRESSION: 1. Hyperexpanded lungs without acute cardiopulmonary disease. Emphysema. (ICD10-J43.9) 2. Previously identified soft tissue about the left upper lobe surgical resection site is suboptimally evaluated on the present examination. Correlation with report from chest CT performed 04/15/2016 is recommended. Electronically Signed   By: Sandi Mariscal M.D.   On: 04/17/2016 15:42     Ct Angio Chest Pe W And/or Wo Contrast  Result Date: 04/15/2016 CLINICAL DATA:  Elevated D-dimer.  Productive cough congestion. EXAM: CT ANGIOGRAPHY CHEST WITH CONTRAST TECHNIQUE: Multidetector CT imaging of the chest was performed using the standard protocol during bolus administration  of intravenous contrast. Multiplanar CT image reconstructions and MIPs were obtained to evaluate the vascular anatomy. CONTRAST:  80 cc Isovue 370 IV COMPARISON:  11/23/2015 FINDINGS: Cardiovascular: No filling defects in the pulmonary arteries to suggest pulmonary emboli. Heart is normal size. Prior CABG. Aorta is tortuous with scattered calcifications, non aneurysmal. Mediastinum/Nodes: Mildly enlarged mediastinal lymph nodes. Subcarinal lymph node has a short axis diameter of 13 mm. Other small scattered paratracheal and AP window lymph nodes. No hilar or axillary adenopathy. Lungs/Pleura: Increasing density noted in the medial left upper lobe in the area of prior postoperative change. This soft tissue measures 2.2 x 1.7 cm on image 15. Given the location in the postoperative bed, this is concerning for tumor recurrence. Nodule in the left lower lobe on image 38 measures 8 mm on image 38, stable. Scarring noted in the lung bases. No pleural effusions. Upper Abdomen: Imaging into the upper abdomen shows no acute findings. Musculoskeletal: Chest wall soft tissues are unremarkable. No acute bony abnormality or focal bone lesion. Review of the MIP images confirms the above findings. IMPRESSION: Enlarging abnormal soft tissue in the left upper lobe at the prior surgical site concerning for recurrent tumor. This measures up to 2.2 cm. This could be further evaluated with PET CT. Stable left lower lobe pulmonary nodule. Borderline and mildly enlarged mediastinal lymph nodes. Electronically Signed   By: Rolm Baptise M.D.   On: 04/15/2016 15:26    Ct Chest Wo Contrast  Result Date: 11/23/2015 CLINICAL DATA:  Followup left lung nodule. Previous wedge resection revealed necrotizing granuloma. EXAM: CT CHEST WITHOUT CONTRAST TECHNIQUE: Multidetector CT imaging of the chest was performed following the standard protocol without IV contrast. COMPARISON:  01/29/2015 FINDINGS: Cardiovascular: Heart size within normal limits.  Aortic atherosclerosis noted. Previous CABG. Mediastinum/Lymph Nodes: No masses or pathologically enlarged lymph nodes identified on this un-enhanced exam. Lungs/Pleura: Postop changes seen in the medial left upper lobe from previous wedge resection. 8 mm mean diameter perifissural nodule nodule is seen in the left midlung on image 66/4. This is new since previous study, but likely represents an intrapulmonary lymph node. Mild centrilobular emphysema noted. Bibasilar scarring and tiny sub-cm posterior right lower lobe pulmonary nodule remains stable. No evidence of acute infiltrate or pleural effusion. Upper abdomen: No acute findings. Musculoskeletal: No chest wall mass or suspicious bone lesions identified. IMPRESSION: Postop changes in left upper lobe. 8 mm mean diameter perifissural nodule in the left midlung is new, but likely represents an intrapulmonary lymph  node. Non-contrast chest CT at 6-12 months is recommended. If the nodule is stable at time of repeat CT, then future CT at 18-24 months (from today's scan) is considered optional for low-risk patients, but is recommended for high-risk patients. This recommendation follows the consensus statement: Guidelines for Management of Incidental Pulmonary Nodules Detected on CT Images:From the Fleischner Society 2017; published online before print (10.1148/radiol.3343568616). Aortic atherosclerosis and emphysema. Electronically Signed   By: Earle Gell M.D.   On: 11/23/2015 15:46    Recent Lab Findings: Lab Results  Component Value Date   WBC 8.9 05/20/2018   HGB 12.2 05/20/2018   HCT 38.5 05/20/2018   PLT 346 05/20/2018   GLUCOSE 108 (H) 12/03/2017   CHOL 165 02/27/2017   TRIG 96.0 02/27/2017   HDL 69.80 02/27/2017   LDLDIRECT 162.1 10/28/2009   LDLCALC 76 02/27/2017   ALT 27 10/08/2017   AST 28 10/08/2017   NA 136 12/03/2017   K 5.2 (H) 12/03/2017   CL 104 12/03/2017   CREATININE 1.54 (H) 12/03/2017   BUN 37 (H) 12/03/2017   CO2 23  12/03/2017   TSH 4.54 (H) 08/24/2017   INR 0.89 05/20/2018   10/2015   03/2016   PFT's 10 /2016 FEV1 1.58 80% DLCO 16.27 71%  Interpretation: The FEV1 is normal, but the FEV1/FVC ratio and FEF25-75% are reduced. While the TLC, FRC and SVC are within normal limits, the RV is increased. Following administration of bronchodilators, there is a slight response. The reduced diffusing capacity indicates a mild loss of functional alveolar capillary surface. However, the diffusing capacity was not corrected for the patient's hemoglobin. Pulmonary Function Diagnosis: Minimal Obstructive Airways Disease with reversibility Mild Diffusion Defect            Recent pathology needle biopsy: Diagnosis Lung, needle/core biopsy(ies), left lower - NECROSIS WITH FOCAL FIBROSIS. - SEE MICROSCOPIC DESCRIPTION. - NO MALIGNANCY IDENTIFIED. Microscopic Comment The core biopsies consist mostly of necrotic debris with one core partially consisting of connective tissue with fibrosis and granulation tissue. No giant cells or malignancy are identified. Special stains for microorganisms will be performed and reported as an addendum. (JDP:ecj 05/21/2018) Claudette Laws MD  ADDITIONAL INFORMATION: Special stains are performed and no acid fat bacilli are identified with AFB stain and no fungi are identified with GMS or PAS stains. (JDP:ecj 05/22/2018) Claudette Laws MD   Assessment / Plan:   Previous history of resection of a left upper lobe nodule that was found to be inflammatory in nature without malignancy.   Now with repeat biopsy of left lower lobe mass-found to be inflammatory, May 20, 2018  Special stains showed no AFB or fungi.  Plan to see the patient back in 6 months with a follow-up CT of the chest.  Grace Isaac MD      [address] [place] [address] Office [phone]   Beeper [phone]  06/06/2018 11:49 AM

## 2018-06-18 MED ORDER — LEVOTHYROXINE SODIUM 88 MCG PO TABS: 88.0000 ug | ORAL_TABLET | Freq: Every day | ORAL | 0 refills | Status: DC

## 2018-06-21 ENCOUNTER — Ambulatory Visit: Payer: Medicare Other | Admitting: Cardiology

## 2018-06-21 ENCOUNTER — Encounter: Payer: Self-pay | Admitting: Cardiology

## 2018-06-21 VITALS — BP 140/75 | HR 78 | Ht 63.0 in | Wt 147.8 lb

## 2018-06-21 DIAGNOSIS — I10 Essential (primary) hypertension: Secondary | ICD-10-CM

## 2018-06-21 DIAGNOSIS — I713 Abdominal aortic aneurysm, ruptured, unspecified: Secondary | ICD-10-CM

## 2018-06-21 DIAGNOSIS — E785 Hyperlipidemia, unspecified: Secondary | ICD-10-CM | POA: Diagnosis not present

## 2018-06-21 DIAGNOSIS — Z951 Presence of aortocoronary bypass graft: Secondary | ICD-10-CM | POA: Diagnosis not present

## 2018-06-21 DIAGNOSIS — I2510 Atherosclerotic heart disease of native coronary artery without angina pectoris: Secondary | ICD-10-CM | POA: Diagnosis not present

## 2018-06-21 DIAGNOSIS — K551 Chronic vascular disorders of intestine: Secondary | ICD-10-CM

## 2018-06-21 NOTE — Patient Instructions (Signed)
Medication Instructions:  Not needed If you need a refill on your cardiac medications before your next appointment, please call your pharmacy.   Lab work: Not needed If you have labs (blood work) drawn today and your tests are completely normal, you will receive your results only by: . MyChart Message (if you have MyChart) OR . A paper copy in the mail If you have any lab test that is abnormal or we need to change your treatment, we will call you to review the results.  Testing/Procedures: Not needed  Follow-Up: At [facility], you and your health needs are our priority.  As part of our continuing mission to provide you with exceptional heart care, we have created designated Provider Care Teams.  These Care Teams include your primary Cardiologist (physician) and Advanced Practice Providers (APPs -  Physician Assistants and Nurse Practitioners) who all work together to provide you with the care you need, when you need it. You will need a follow up appointment in 12 months.  Please call our office 2 months in advance to schedule this appointment.  You may see David Harding, MD or one of the following Advanced Practice Providers on your designated Care Team:   Rhonda Barrett, PA-C . Kathryn Lawrence, DNP, ANP  Any Other Special Instructions Will Be Listed Below (If Applicable).   

## 2018-06-21 NOTE — Progress Notes (Signed)
PCP: Plotnikov, [name], MD  Clinic Note: Chief Complaint  Patient presents with  . Follow-up    Stable  . Coronary Artery Disease    No angina, just stable exertional dyspnea    HPI: [name] is a 78 y.o. female with a PMH of COPD (GOLD1)/AAA/ CAD described below who presents today for Annual follow-up.   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  She is being followed by Vascular Surgery -Dr.Brabham.  Follow-up catheterizations have shown an atretic LIMA with improved LAD flow with now 50% ostial LAD. She is a former smoker who quit in 1960. Referred to pulmonary medicine for new cough.  Chest x-ray suggested pulmonary nodule   [name] was last seen in February 2019- doing relatively well, Only had shortness of breath beyond her baseline at that time.  Recent Hospitalizations:   Video Bronchoscopy /Biopsy 10/08/2017 (L Lower Lobe) --> necrotizing granuloma (indeterminate findings - both malignant & infectious etiologies considered)  Studies Reviewed: - none.    Interval History: [name] presents here today again stable from a cardiac standpoint.  She really does not have any active cardiac symptoms.  She was very happy to hear the results of her biopsy not suggesting any evidence of malignancy. She has a stable slow progression of her exertional dyspnea.  She states that she can do whatever she wants to do, and if she ever goes on a trip for somewhere special, she will take prednisone and her inhaler and it usually makes her feel much better.  She denies any resting exertional chest pain or pressure consistent with angina.   She says that she does pretty much whatever she wants to do activity wise, but is not really doing routine exercise per se.  Remainder of Cardiovascular ROS: positive for - dyspnea on exertion negative for - chest pain, edema, irregular heartbeat,  murmur, orthopnea, palpitations, paroxysmal nocturnal dyspnea, rapid heart rate, shortness of breath or Syncope/near syncope/TIA/amaurosis fugax, claudication.   ROS: A comprehensive was performed. Review of Systems  Constitutional: Negative for malaise/fatigue (Just noticing some deconditioning, limited by knee pain.).  HENT: Negative for congestion and sinus pain.   Respiratory: Negative for cough (Occasional morning cough, but nothing) and wheezing.   Gastrointestinal: Negative for blood in stool and constipation.  Genitourinary: Negative for hematuria.  Musculoskeletal: Positive for joint pain (left knee pain). Negative for falls.  Neurological: Positive for dizziness (Occasionally with position change).  Endo/Heme/Allergies: Does not bruise/bleed easily.  Psychiatric/Behavioral: Positive for memory loss. Negative for depression. The patient is not nervous/anxious and does not have insomnia.   All other systems reviewed and are negative.   Past Medical History:  Diagnosis Date  . Abdominal aortic aneurysm (HCC)    REPAIRED IN 1996 BY [name]  AND HAS RECENTLY BEEN FOLLOWED BY [name]  . 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 Adenomatous colon polyp    tubular  . Anemia   . CAD in native artery 1996, 2002, 2005    Status post CABG x1 with LIMA-LAD for ostial LAD 90% stenosis --> down to 50% in 2002 and 30% in 2005.;  Atretic LIMA; Myoview 06/2010: Fixed anteroseptal, apical and inferoapical defect with moderate size. Most likely scar. Mild subendocardial ischemia. EF 71% LOW RISK.   [name] Cervical disc disease   .  Chronic kidney disease   . COPD (chronic obstructive pulmonary disease) ([place])   . Diverticulosis   . Hyperlipidemia   . Hypertension   . Hypothyroidism (acquired)    hypo  . Myocardial infarction [facility]) Aug. 2016   TIA  . Polymyalgia rheumatica (HCC)    2011 Dr. Charlestine Night  . Rheumatoid  arthritis ([place]) 2011   Dr.Truslow; fracture knees, hands and wrists -   . S/P CABG x 1 1996   CABG--LIMA-LAD for ostial LAD (not felt to be PCI amenable). EF NORMAL then; LIMA now atretic  . Shortness of breath dyspnea    with exertion  . Stroke [facility]) 11-2014   TIA   . Urinary frequency     Past Surgical History:  Procedure Laterality Date  . ABDOMINAL AORTIC ANEURYSM REPAIR  6063   Complicated by mesenteric artery stenosis and splenic artery infarction with acquired Asplenia  . APPENDECTOMY    . BUNIONECTOMY  07/2011   right foot  . CARDIAC CATHETERIZATION  2005   (Most recent CATH) - ostial LAD lesion 20-30% (down from 90% initially). Atretic LIMA. Minimal disease the RCA and Circumflex system.  [name] CARPAL TUNNEL RELEASE Left   . CATARACT EXTRACTION Bilateral   . CERVICAL SPINE SURGERY     plate 2008 Dr. Saintclair Halsted  . [place]   INCLUDED AN INTERNAL MAMMARY ARTERY TO THE LAD. EF WAS NORMAL  . INGUINAL HERNIA REPAIR Right   . LAPAROSCOPIC APPENDECTOMY N/A 06/28/2016   Procedure: APPENDECTOMY LAPAROSCOPIC;  Surgeon: Leighton Ruff, MD;  Location: [facility];  Service: General;  Laterality: N/A;  . NM MYOVIEW LTD  March 2012   Fixed anteroseptal, apical and inferoapical defect with moderate size. Most likely scar. Mild subendocardial ischemia. EF 71% LOW RISK.   [name] SPLENECTOMY    . TRANSTHORACIC ECHOCARDIOGRAM  12/2014   [facility]: Normal LV size & function. EF 55-60%,   . vagina polyp    . VIDEO ASSISTED THORACOSCOPY (VATS)/WEDGE RESECTION Left 03/02/2015   Procedure: VIDEO ASSISTED THORACOSCOPY (VATS), MINI THORACOTOMY, LEFT UPPER LOBE WEDGE, TAKE DOWN OF INTERNAL MAMMARY LESIONS, PLACEMENT OF ON-Q PUMP;  Surgeon: Grace Isaac, MD;  Location: [place];  Service: Thoracic;  Laterality: Left;  [name] VIDEO BRONCHOSCOPY N/A 03/02/2015   Procedure: BRONCHOSCOPY;  Surgeon: Grace Isaac, MD;  Location: [place];  Service: Thoracic;  Laterality: N/A;  . VIDEO  BRONCHOSCOPY WITH ENDOBRONCHIAL NAVIGATION N/A 10/08/2017   Procedure: VIDEO BRONCHOSCOPY WITH ENDOBRONCHIAL NAVIGATION WITH BIOPSIES OF LEFT UPPER LOBE AND LEFT LOWER LOBE;  Surgeon: Grace Isaac, MD;  Location: [facility];  Service: Thoracic;  Laterality: N/A;    Current Meds  Medication Sig  . acetaminophen (TYLENOL) 500 MG tablet Take 500 mg by mouth every 6 (six) hours as needed (pain).   [name] atorvastatin (LIPITOR) 80 MG tablet TAKE 1 TABLET BY MOUTH EVERY DAY  . CHLORPHENIRAMINE-ACETAMINOPHEN PO Take 1 tablet by mouth 2 (two) times daily.  . clopidogrel (PLAVIX) 75 MG tablet TAKE 1 TABLET BY MOUTH  DAILY  . famotidine (PEPCID) 20 MG tablet One at bedtime (Patient taking differently: Take 20 mg by mouth daily as needed for heartburn. )  . fluticasone furoate-vilanterol (BREO ELLIPTA) 100-25 MCG/INH AEPB Inhale 1 puff into the lungs daily.  . Investigational - Study Medication Take 3 capsules by mouth daily. Study name: Women's Health Initiative Study COSMOS  Additional study details: 1 cap is Calcium, 2 caps are Coco extract  . leflunomide (ARAVA) 20  MG tablet Take 20 mg by mouth every other day.   . levothyroxine (SYNTHROID, LEVOTHROID) 88 MCG tablet Take 1 tablet (88 mcg total) by mouth daily before breakfast. Keep scheduled appt for future refills  . loperamide (IMODIUM A-D) 2 MG tablet Take 2 mg by mouth 4 (four) times daily as needed for diarrhea or loose stools.  [name] losartan (COZAAR) 25 MG tablet TAKE 1 TABLET BY MOUTH  DAILY  . metoprolol succinate (TOPROL-XL) 25 MG 24 hr tablet TAKE 1 TABLET BY MOUTH  DAILY  . pantoprazole (PROTONIX) 40 MG tablet TAKE 1 TABLET (40 MG TOTAL) BY MOUTH DAILY. TAKE 30-60 MIN BEFORE FIRST MEAL OF THE DAY  . psyllium (METAMUCIL) 58.6 % packet Take 1 packet by mouth daily.  [name] triamcinolone (NASACORT ALLERGY 24HR) 55 MCG/ACT AERO nasal inhaler Place 1 spray into the nose daily.     Allergies  Allergen Reactions  . Aspirin Other (See Comments)    Hurts  stomach  . Codeine Nausea And Vomiting  . Nitroglycerin Other (See Comments)    Heart rate drops   Social History   Tobacco Use  . Smoking status: Former Smoker    Last attempt to quit: 12/04/1958    Years since quitting: 59.5  . Smokeless tobacco: Never Used  Substance Use Topics  . Alcohol use: No  . Drug use: No   Social History   Social History Narrative   [facility]  MARRIED -RETIRED ; [place]. GOES TO [place] 3 DAYS A WEEK FOR ABOUT 45 MIN. PER SESSION      Living at [facility] since dec 2015    family history includes Cancer in her paternal uncle; Coronary artery disease in her unknown relative; Diabetes in her mother; Heart disease in her mother; Hyperlipidemia in her mother, sister, son, son, and son; Hypertension in her daughter, mother, sister, son, son, son, and unknown relative; Stroke in her father.  Wt Readings from Last 3 Encounters:  06/21/18 147 lb 12.8 oz (67 kg)  06/06/18 145 lb (65.8 kg)  05/20/18 145 lb (65.8 kg)    PHYSICAL EXAM BP 140/75   Pulse 78   Ht 5\' 3"  (1.6 m)   Wt 147 lb 12.8 oz (67 kg)   BMI 26.18 kg/m   Physical Exam  Constitutional: She is oriented to person, place, and time. She appears well-developed and well-nourished. No distress.  Healthy-appearing.  Well-groomed  HENT:  Head: Normocephalic and atraumatic.  Mouth/Throat: No oropharyngeal exudate.  Neck: Normal range of motion. Neck supple. No hepatojugular reflux and no JVD present. Carotid bruit is not present.  Cardiovascular: Normal rate, regular rhythm, normal heart sounds, intact distal pulses and normal pulses.  No extrasystoles are present. PMI is not displaced. Exam reveals no gallop and no friction rub.  No murmur heard. Pulmonary/Chest: Effort normal and breath sounds normal. No respiratory distress. She exhibits no tenderness.  Mild interstitial breath sounds.  No obvious wheezes rales or rhonchi.  Abdominal: Soft.  Bowel sounds are normal. She exhibits no distension. There is no abdominal tenderness. There is no rebound.  No abdominal bruit  Musculoskeletal: Normal range of motion.        General: No edema.  Neurological: She is alert and oriented to person, place, and time.  Psychiatric: She has a normal mood and affect. Her behavior is normal. Judgment and thought content normal.  Nursing note and vitals reviewed.    Adult ECG Report  Rate:  78;  Rhythm: normal sinus rhythm, sinus arrhythmia and Borderline rightward axis, but otherwise normal, intervals and durations;   Narrative Interpretation: Stable EKG  Other studies Reviewed: Additional studies/ records that were reviewed today include:  Recent Labs: Due for follow-up labs. Lab Results  Component Value Date   CHOL 165 02/27/2017   HDL 69.80 02/27/2017   LDLCALC 76 02/27/2017   LDLDIRECT 162.1 10/28/2009   TRIG 96.0 02/27/2017   CHOLHDL 2 02/27/2017    ASSESSMENT / PLAN: Problem List Items Addressed This Visit    Atherosclerosis of coronary artery without angina pectoris - Primary (Chronic)    Distant history of LIMA to LAD at the same time of AAA repair.  There is thought to be ostial LAD disease, but on follow-up cath of shown minimal LAD disease and atretic LIMA.  No longer physiologic significant LAD lesion. She has not had any angina since I have known her.  She is just as slow steady progression of exertional dyspnea.  Plan: Continue current meds   She is on 80 mg of atorvastatin, tolerating it well.  On Plavix without aspirin and no bleeding issues. -->   Okay to hold for procedures 5 to 7 days preop  On losartan and Toprol at stable doses.      Relevant Orders   EKG 12-Lead   Essential hypertension (Chronic)    Borderline today, but usually much better than this at home.  We will continue to monitor on current dose of losartan and Toprol.      Relevant Orders   EKG 12-Lead   Hyperlipidemia with target LDL less  than 70 (Chronic)    Has not had labs checked in a while.  She should be getting them checked by PCP in roughly May timeframe.  Last set of labs were in October 2018 with an LDL of 76.  She was not quite at goal. If not noted to have LDL less than 70 on 80 of atorvastatin, would consider adding Zetia if not close.      Relevant Orders   EKG 12-Lead   Mesenteric artery stenosis (HCC) (Chronic)    Related to her AAA repair.  She denies any postprandial pain.  Is followed by vascular surgery.      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; now atretic and significantly improved LAD lesion without intervention (Chronic)   Relevant Orders   EKG 12-Lead   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) (Chronic)    Still being followed by Dr. Trula Slade.  Stable.         Current medicines are reviewed at length with the patient today. (+/- concerns) n/a The following changes have been made: n/a  Patient Instructions  Medication Instructions:  Not needed If you need a refill on your cardiac medications before your next appointment, please call your pharmacy.   Lab work: Not needed If you have labs (blood work) drawn today and your tests are completely normal, you will receive your results only by: [name] MyChart Message (if you have MyChart) OR . A paper copy in the mail If you have any lab test that is abnormal or we need to change your treatment, we will call you to review the results.  Testing/Procedures: Not needed  Follow-Up: At [facility], you and your health needs are our priority.  As part of our continuing mission to provide you with exceptional heart care, we have created designated Provider Care Teams.  These  Care Teams include your primary Cardiologist (physician) and Advanced Practice Providers (APPs -  Physician Assistants and Nurse Practitioners) who all work together to provide you with the care you need, when you need it. You will need a  follow up appointment in 12 months.  Please call our office 2 months in advance to schedule this appointment.  You may see Glenetta Hew, MD or one of the following Advanced Practice Providers on your designated Care Team:   Rosaria Ferries, PA-C . Jory Sims, DNP, ANP  Any Other Special Instructions Will Be Listed Below (If Applicable).      Studies Ordered:   Orders Placed This Encounter  Procedures  . EKG 12-Lead      Glenetta Hew, M.D., M.S. Interventional Cardiologist   Pager # [phone] Phone # [phone] [address] [place],  [address]

## 2018-06-23 ENCOUNTER — Encounter: Payer: Self-pay | Admitting: Cardiology

## 2018-06-23 NOTE — Assessment & Plan Note (Signed)
Borderline today, but usually much better than this at home.  We will continue to monitor on current dose of losartan and Toprol.

## 2018-06-23 NOTE — Assessment & Plan Note (Signed)
Distant history of LIMA to LAD at the same time of AAA repair.  There is thought to be ostial LAD disease, but on follow-up cath of shown minimal LAD disease and atretic LIMA.  No longer physiologic significant LAD lesion. She has not had any angina since I have known her.  She is just as slow steady progression of exertional dyspnea.  Plan: Continue current meds   She is on 80 mg of atorvastatin, tolerating it well.  On Plavix without aspirin and no bleeding issues. -->   Okay to hold for procedures 5 to 7 days preop  On losartan and Toprol at stable doses.

## 2018-06-23 NOTE — Assessment & Plan Note (Signed)
Has not had labs checked in a while.  She should be getting them checked by PCP in roughly May timeframe.  Last set of labs were in October 2018 with an LDL of 76.  She was not quite at goal. If not noted to have LDL less than 70 on 80 of atorvastatin, would consider adding Zetia if not close.

## 2018-06-23 NOTE — Assessment & Plan Note (Signed)
Still being followed by Dr. Trula Slade.  Stable.

## 2018-06-23 NOTE — Assessment & Plan Note (Signed)
Related to her AAA repair.  She denies any postprandial pain.  Is followed by vascular surgery.

## 2018-07-26 ENCOUNTER — Ambulatory Visit: Payer: Medicare Other | Admitting: Internal Medicine

## 2018-08-08 ENCOUNTER — Other Ambulatory Visit: Payer: Self-pay | Admitting: General Surgery

## 2018-08-08 DIAGNOSIS — R058 Other specified cough: Secondary | ICD-10-CM

## 2018-08-08 DIAGNOSIS — J449 Chronic obstructive pulmonary disease, unspecified: Secondary | ICD-10-CM

## 2018-08-08 DIAGNOSIS — R05 Cough: Secondary | ICD-10-CM

## 2018-08-08 MED ORDER — FAMOTIDINE 20 MG PO TABS: ORAL_TABLET | ORAL | 2 refills | Status: DC

## 2018-08-08 MED ORDER — FLUTICASONE FUROATE-VILANTEROL 100-25 MCG/INH IN AEPB: 1.0000 | INHALATION_SPRAY | Freq: Every day | RESPIRATORY_TRACT | 3 refills | Status: DC

## 2018-08-08 MED ORDER — PANTOPRAZOLE SODIUM 40 MG PO TBEC: 40.0000 mg | DELAYED_RELEASE_TABLET | Freq: Every day | ORAL | 2 refills | Status: DC

## 2018-08-14 ENCOUNTER — Other Ambulatory Visit: Payer: Self-pay | Admitting: Internal Medicine

## 2018-08-14 DIAGNOSIS — R05 Cough: Secondary | ICD-10-CM

## 2018-08-14 DIAGNOSIS — R058 Other specified cough: Secondary | ICD-10-CM

## 2018-08-15 ENCOUNTER — Ambulatory Visit: Payer: Medicare Other | Admitting: Cardiothoracic Surgery

## 2018-08-26 MED ORDER — ATORVASTATIN CALCIUM 80 MG PO TABS: 80.0000 mg | ORAL_TABLET | Freq: Every day | ORAL | 1 refills | Status: DC

## 2018-09-04 ENCOUNTER — Ambulatory Visit: Payer: Medicare Other | Admitting: Internal Medicine

## 2018-10-24 ENCOUNTER — Other Ambulatory Visit: Payer: Self-pay | Admitting: *Deleted

## 2018-10-24 DIAGNOSIS — R918 Other nonspecific abnormal finding of lung field: Secondary | ICD-10-CM

## 2018-11-13 ENCOUNTER — Other Ambulatory Visit: Payer: Self-pay

## 2018-11-13 ENCOUNTER — Other Ambulatory Visit (INDEPENDENT_AMBULATORY_CARE_PROVIDER_SITE_OTHER): Payer: Medicare Other

## 2018-11-13 ENCOUNTER — Encounter: Payer: Self-pay | Admitting: Internal Medicine

## 2018-11-13 ENCOUNTER — Ambulatory Visit (INDEPENDENT_AMBULATORY_CARE_PROVIDER_SITE_OTHER): Payer: Medicare Other | Admitting: Internal Medicine

## 2018-11-13 VITALS — BP 148/88 | HR 77 | Temp 98.0°F | Ht 63.0 in | Wt 148.0 lb

## 2018-11-13 DIAGNOSIS — M810 Age-related osteoporosis without current pathological fracture: Secondary | ICD-10-CM | POA: Diagnosis not present

## 2018-11-13 DIAGNOSIS — I2510 Atherosclerotic heart disease of native coronary artery without angina pectoris: Secondary | ICD-10-CM

## 2018-11-13 DIAGNOSIS — E785 Hyperlipidemia, unspecified: Secondary | ICD-10-CM

## 2018-11-13 DIAGNOSIS — R05 Cough: Secondary | ICD-10-CM

## 2018-11-13 DIAGNOSIS — E559 Vitamin D deficiency, unspecified: Secondary | ICD-10-CM

## 2018-11-13 DIAGNOSIS — I10 Essential (primary) hypertension: Secondary | ICD-10-CM

## 2018-11-13 DIAGNOSIS — D510 Vitamin B12 deficiency anemia due to intrinsic factor deficiency: Secondary | ICD-10-CM | POA: Diagnosis not present

## 2018-11-13 DIAGNOSIS — R058 Other specified cough: Secondary | ICD-10-CM

## 2018-11-13 LAB — CBC WITH DIFFERENTIAL/PLATELET
Basophils Absolute: 0 10*3/uL (ref 0.0–0.1)
Basophils Relative: 0.5 % (ref 0.0–3.0)
Eosinophils Absolute: 0.3 10*3/uL (ref 0.0–0.7)
Eosinophils Relative: 3.3 % (ref 0.0–5.0)
HCT: 38.6 % (ref 36.0–46.0)
Hemoglobin: 12.7 g/dL (ref 12.0–15.0)
Lymphocytes Relative: 14.1 % (ref 12.0–46.0)
Lymphs Abs: 1.3 10*3/uL (ref 0.7–4.0)
MCHC: 32.8 g/dL (ref 30.0–36.0)
MCV: 95.4 fl (ref 78.0–100.0)
Monocytes Absolute: 1.2 10*3/uL — ABNORMAL HIGH (ref 0.1–1.0)
Monocytes Relative: 13.2 % — ABNORMAL HIGH (ref 3.0–12.0)
Neutro Abs: 6.3 10*3/uL (ref 1.4–7.7)
Neutrophils Relative %: 68.9 % (ref 43.0–77.0)
Platelets: 315 10*3/uL (ref 150.0–400.0)
RBC: 4.05 Mil/uL (ref 3.87–5.11)
RDW: 15.3 % (ref 11.5–15.5)
WBC: 9.2 10*3/uL (ref 4.0–10.5)

## 2018-11-13 LAB — HEPATIC FUNCTION PANEL
ALT: 28 U/L (ref 0–35)
AST: 23 U/L (ref 0–37)
Albumin: 4.2 g/dL (ref 3.5–5.2)
Alkaline Phosphatase: 104 U/L (ref 39–117)
Bilirubin, Direct: 0.1 mg/dL (ref 0.0–0.3)
Total Bilirubin: 0.7 mg/dL (ref 0.2–1.2)
Total Protein: 6.6 g/dL (ref 6.0–8.3)

## 2018-11-13 LAB — VITAMIN B12: Vitamin B-12: 524 pg/mL (ref 211–911)

## 2018-11-13 LAB — LIPID PANEL
Cholesterol: 152 mg/dL (ref 0–200)
HDL: 72.5 mg/dL (ref >39.00)
LDL Cholesterol: 68 mg/dL (ref 0–99)
NonHDL: 79.79
Total CHOL/HDL Ratio: 2
Triglycerides: 57 mg/dL (ref 0.0–149.0)
VLDL: 11.4 mg/dL (ref 0.0–40.0)

## 2018-11-13 LAB — BASIC METABOLIC PANEL
BUN: 33 mg/dL — ABNORMAL HIGH (ref 6–23)
CO2: 27 mEq/L (ref 19–32)
Calcium: 9.7 mg/dL (ref 8.4–10.5)
Chloride: 104 mEq/L (ref 96–112)
Creatinine, Ser: 1.5 mg/dL — ABNORMAL HIGH (ref 0.40–1.20)
GFR: 33.38 mL/min — ABNORMAL LOW (ref >60.00)
Glucose, Bld: 87 mg/dL (ref 70–99)
Potassium: 4.8 mEq/L (ref 3.5–5.1)
Sodium: 139 mEq/L (ref 135–145)

## 2018-11-13 LAB — TSH: TSH: 2.57 u[IU]/mL (ref 0.35–4.50)

## 2018-11-13 LAB — VITAMIN D 25 HYDROXY (VIT D DEFICIENCY, FRACTURES): VITD: 31.88 ng/mL (ref 30.00–100.00)

## 2018-11-13 MED ORDER — VITAMIN D3 50 MCG (2000 UT) PO CAPS: 2000.0000 [IU] | ORAL_CAPSULE | Freq: Every day | ORAL | 3 refills | Status: DC

## 2018-11-13 MED ORDER — PANTOPRAZOLE SODIUM 40 MG PO TBEC: 40.0000 mg | DELAYED_RELEASE_TABLET | Freq: Every day | ORAL | 3 refills | Status: DC

## 2018-11-13 MED ORDER — METOPROLOL SUCCINATE ER 25 MG PO TB24: 25.0000 mg | ORAL_TABLET | Freq: Every day | ORAL | 3 refills | Status: DC

## 2018-11-13 MED ORDER — ATORVASTATIN CALCIUM 80 MG PO TABS: 80.0000 mg | ORAL_TABLET | Freq: Every day | ORAL | 3 refills | Status: DC

## 2018-11-13 MED ORDER — CLOPIDOGREL BISULFATE 75 MG PO TABS: 75.0000 mg | ORAL_TABLET | Freq: Every day | ORAL | 3 refills | Status: DC

## 2018-11-13 MED ORDER — LOSARTAN POTASSIUM 25 MG PO TABS: 25.0000 mg | ORAL_TABLET | Freq: Every day | ORAL | 3 refills | Status: DC

## 2018-11-13 NOTE — Patient Instructions (Signed)
If you have medicare related insurance (such as traditional Medicare, Blue Cross Medicare, United HealthCare Medicare, or similar), Please make an appointment at the scheduling desk with Jill, the Wellness Health Coach, for your Wellness visit in this office, which is a benefit with your insurance.  

## 2018-11-13 NOTE — Assessment & Plan Note (Signed)
On B12 

## 2018-11-13 NOTE — Assessment & Plan Note (Signed)
Chronic BP OK at home  Losartan, Metoprolol

## 2018-11-13 NOTE — Assessment & Plan Note (Signed)
[name]

## 2018-11-13 NOTE — Progress Notes (Signed)
Subjective:  Patient ID: [name], female    DOB: Sep 04, 1940  Age: 78 y.o. MRN: [id number]  CC: No chief complaint on file.   HPI [name] presents for RA, HTN, CAD f/u  Outpatient Medications Prior to Visit  Medication Sig Dispense Refill  . acetaminophen (TYLENOL) 500 MG tablet Take 500 mg by mouth every 6 (six) hours as needed (pain).     [name] atorvastatin (LIPITOR) 80 MG tablet Take 1 tablet (80 mg total) by mouth daily. 90 tablet 1  . CHLORPHENIRAMINE-ACETAMINOPHEN PO Take 1 tablet by mouth 2 (two) times daily.    . clopidogrel (PLAVIX) 75 MG tablet TAKE 1 TABLET BY MOUTH  DAILY 90 tablet 1  . famotidine (PEPCID) 20 MG tablet One at bedtime 90 tablet 2  . fluticasone furoate-vilanterol (BREO ELLIPTA) 100-25 MCG/INH AEPB Inhale 1 puff into the lungs daily. 3 each 3  . Investigational - Study Medication Take 3 capsules by mouth daily. Study name: Women's Health Initiative Study COSMOS  Additional study details: 1 cap is Calcium, 2 caps are Coco extract    . leflunomide (ARAVA) 20 MG tablet Take 20 mg by mouth every other day.     . levothyroxine (SYNTHROID, LEVOTHROID) 88 MCG tablet Take 1 tablet (88 mcg total) by mouth daily before breakfast. Keep scheduled appt for future refills 90 tablet 0  . loperamide (IMODIUM A-D) 2 MG tablet Take 2 mg by mouth 4 (four) times daily as needed for diarrhea or loose stools.    [name] losartan (COZAAR) 25 MG tablet TAKE 1 TABLET BY MOUTH  DAILY 90 tablet 1  . metoprolol succinate (TOPROL-XL) 25 MG 24 hr tablet TAKE 1 TABLET BY MOUTH  DAILY 90 tablet 1  . pantoprazole (PROTONIX) 40 MG tablet TAKE 1 TABLET (40 MG TOTAL) BY MOUTH DAILY. TAKE 30-60 MIN BEFORE FIRST MEAL OF THE DAY 90 tablet 0  . psyllium (METAMUCIL) 58.6 % packet Take 1 packet by mouth daily.    [name] triamcinolone (NASACORT ALLERGY 24HR) 55 MCG/ACT AERO nasal inhaler Place 1 spray into the nose daily.      No facility-administered medications prior to visit.     ROS: Review of  Systems  Constitutional: Negative for activity change, appetite change, chills, fatigue and unexpected weight change.  HENT: Negative for congestion, mouth sores and sinus pressure.   Eyes: Negative for visual disturbance.  Respiratory: Negative for cough and chest tightness.   Gastrointestinal: Negative for abdominal pain and nausea.  Genitourinary: Negative for difficulty urinating, frequency and vaginal pain.  Musculoskeletal: Positive for arthralgias. Negative for back pain and gait problem.  Skin: Negative for pallor and rash.  Neurological: Negative for dizziness, tremors, weakness, numbness and headaches.  Psychiatric/Behavioral: Negative for confusion, sleep disturbance and suicidal ideas.    Objective:  BP (!) 148/88 (BP Location: Left Arm, Patient Position: Sitting, Cuff Size: Normal)   Pulse 77   Temp 98 F (36.7 C) (Oral)   Ht 5\' 3"  (1.6 m)   Wt 148 lb (67.1 kg)   SpO2 94%   BMI 26.22 kg/m   BP Readings from Last 3 Encounters:  11/13/18 (!) 148/88  06/21/18 140/75  06/06/18 130/80    Wt Readings from Last 3 Encounters:  11/13/18 148 lb (67.1 kg)  06/21/18 147 lb 12.8 oz (67 kg)  06/06/18 145 lb (65.8 kg)    Physical Exam Constitutional:      General: She is not in acute distress.    Appearance: She is  well-developed.  HENT:     Head: Normocephalic.     Right Ear: External ear normal.     Left Ear: External ear normal.     Nose: Nose normal.  Eyes:     General:        Right eye: No discharge.        Left eye: No discharge.     Conjunctiva/sclera: Conjunctivae normal.     Pupils: Pupils are equal, round, and reactive to light.  Neck:     Musculoskeletal: Normal range of motion and neck supple.     Thyroid: No thyromegaly.     Vascular: No JVD.     Trachea: No tracheal deviation.  Cardiovascular:     Rate and Rhythm: Normal rate and regular rhythm.     Heart sounds: Normal heart sounds.  Pulmonary:     Effort: No respiratory distress.     Breath  sounds: No stridor. No wheezing.  Abdominal:     General: Bowel sounds are normal. There is no distension.     Palpations: Abdomen is soft. There is no mass.     Tenderness: There is no abdominal tenderness. There is no guarding or rebound.  Musculoskeletal:        General: No tenderness.  Lymphadenopathy:     Cervical: No cervical adenopathy.  Skin:    Findings: No erythema or rash.  Neurological:     Cranial Nerves: No cranial nerve deficit.     Motor: No abnormal muscle tone.     Coordination: Coordination normal.     Deep Tendon Reflexes: Reflexes normal.  Psychiatric:        Behavior: Behavior normal.        Thought Content: Thought content normal.        Judgment: Judgment normal.     Lab Results  Component Value Date   WBC 8.9 05/20/2018   HGB 12.2 05/20/2018   HCT 38.5 05/20/2018   PLT 346 05/20/2018   GLUCOSE 108 (H) 12/03/2017   CHOL 165 02/27/2017   TRIG 96.0 02/27/2017   HDL 69.80 02/27/2017   LDLDIRECT 162.1 10/28/2009   LDLCALC 76 02/27/2017   ALT 27 10/08/2017   AST 28 10/08/2017   NA 136 12/03/2017   K 5.2 (H) 12/03/2017   CL 104 12/03/2017   CREATININE 1.54 (H) 12/03/2017   BUN 37 (H) 12/03/2017   CO2 23 12/03/2017   TSH 4.54 (H) 08/24/2017   INR 0.89 05/20/2018    Ct Biopsy  Result Date: 05/20/2018 INDICATION: 78 year old with an enlarging left lower lobe pulmonary nodule. Previous pulmonary nodule resection demonstrated a granuloma. EXAM: CT-GUIDED CORE BIOPSY OF LEFT LOWER LOBE PULMONARY NODULE MEDICATIONS: None. ANESTHESIA/SEDATION: Moderate (conscious) sedation was employed during this procedure. A total of Versed 1.0 mg and Fentanyl 50 mcg was administered intravenously. Moderate Sedation Time: 23 minutes. The patient's level of consciousness and vital signs were monitored continuously by radiology nursing throughout the procedure under my direct supervision. FLUOROSCOPY TIME:  None COMPLICATIONS: None immediate. PROCEDURE: Informed written  consent was obtained from the patient after a thorough discussion of the procedural risks, benefits and alternatives. All questions were addressed. A timeout was performed prior to the initiation of the procedure. Patient was placed on her left side. CT images through the lower chest were obtained. Left side of the back was prepped with chlorhexidine and sterile field was created. Maximal barrier sterile technique was utilized including caps, mask, sterile gowns, sterile gloves, sterile drape, hand hygiene and  skin antiseptic. Skin was anesthetized with 1% lidocaine. 17 gauge coaxial needle was directed into the nodule using CT guidance. Needle position confirmed in the lesion. Three core biopsies obtained with an 18 gauge core device. Two specimens placed in formalin and one placed in saline. The 17 gauge needle was removed using the BioSentry tract sealant. Bandage placed over the puncture site. FINDINGS: 1.6 cm nodule at the base of the left lower lobe. Biopsy needle was confirmed within the lesion. Three small core biopsies were obtained. Negative for a pneumothorax or significant bleeding following the core biopsies. IMPRESSION: CT-guided core biopsies of the left lower lobe pulmonary nodule. Electronically Signed   By: Markus Daft M.D.   On: 05/20/2018 12:39   Dg Chest Port 1 View  Result Date: 05/20/2018 CLINICAL DATA:  Status post lung biopsy. EXAM: PORTABLE CHEST 1 VIEW COMPARISON:  Two-view chest x-ray 12/03/2017. Procedure CT of the same day. FINDINGS: Heart is enlarged. CABG changes are noted. Lungs are hyperexpanded. There is no significant pneumothorax. A small left pleural effusion is as expected. IMPRESSION: 1. No significant pneumothorax following left lung biopsy. 2. Aortic atherosclerosis. Electronically Signed   By: San Morelle M.D.   On: 05/20/2018 13:22    Assessment & Plan:   There are no diagnoses linked to this encounter.   No orders of the defined types were placed in  this encounter.    Follow-up: No follow-ups on file.  [name], MD

## 2018-11-13 NOTE — Assessment & Plan Note (Signed)
Losartan, Metoprolol, Lipitor, Plavix

## 2018-11-14 ENCOUNTER — Other Ambulatory Visit (INDEPENDENT_AMBULATORY_CARE_PROVIDER_SITE_OTHER): Payer: Medicare Other

## 2018-11-14 DIAGNOSIS — I10 Essential (primary) hypertension: Secondary | ICD-10-CM

## 2018-11-14 LAB — URINALYSIS
Bilirubin Urine: NEGATIVE
Hgb urine dipstick: NEGATIVE
Ketones, ur: NEGATIVE
Leukocytes,Ua: NEGATIVE
Nitrite: NEGATIVE
Specific Gravity, Urine: 1.01 (ref 1.000–1.030)
Urine Glucose: NEGATIVE
Urobilinogen, UA: 0.2 (ref 0.0–1.0)
pH: 6 (ref 5.0–8.0)

## 2018-12-05 ENCOUNTER — Other Ambulatory Visit: Payer: Self-pay | Admitting: Internal Medicine

## 2018-12-19 ENCOUNTER — Ambulatory Visit: Payer: Medicare Other | Admitting: Cardiothoracic Surgery

## 2018-12-19 ENCOUNTER — Other Ambulatory Visit: Payer: Medicare Other

## 2019-01-15 LAB — HM MAMMOGRAPHY

## 2019-01-16 ENCOUNTER — Other Ambulatory Visit: Payer: Self-pay

## 2019-01-16 ENCOUNTER — Ambulatory Visit (INDEPENDENT_AMBULATORY_CARE_PROVIDER_SITE_OTHER): Payer: Medicare Other | Admitting: *Deleted

## 2019-01-16 VITALS — BP 134/72 | HR 73 | Resp 18 | Ht 63.0 in | Wt 148.0 lb

## 2019-01-16 DIAGNOSIS — Z0000 Encounter for general adult medical examination without abnormal findings: Secondary | ICD-10-CM

## 2019-01-16 NOTE — Patient Instructions (Addendum)
Continue doing brain stimulating activities (puzzles, reading, adult coloring books, staying active) to keep memory sharp.   Continue to eat heart healthy diet (full of fruits, vegetables, whole grains, lean protein, water--limit salt, fat, and sugar intake) and increase physical activity as tolerated.   [name] , Thank you for taking time to come for your Medicare Wellness Visit. I appreciate your ongoing commitment to your health goals. Please review the following plan we discussed and let me know if I can assist you in the future.   These are the goals we discussed: Goals      Patient Stated   . 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 maintian current health status     Continue eat healthy, exercise more by walking more routinely, enjoy life and family       This is a list of the screening recommended for you and due dates:  Health Maintenance  Topic Date Due  . Flu Shot  11/30/2018  . Colon Cancer Screening  03/30/2019  . Tetanus Vaccine  06/01/2026  . DEXA scan (bone density measurement)  Completed  . Pneumonia vaccines  Completed    Preventive Care 77 Years and Older, Female Preventive care refers to lifestyle choices and visits with your health care provider that can promote health and wellness. This includes:  A yearly physical exam. This is also called an annual well check.  Regular dental and eye exams.  Immunizations.  Screening for certain conditions.  Healthy lifestyle choices, such as diet and exercise. What can I expect for my preventive care visit? Physical exam Your health care provider will check:  Height and weight. These may be used to calculate body mass index (BMI), which is a measurement that tells if you are at a  healthy weight.  Heart rate and blood pressure.  Your skin for abnormal spots. Counseling Your health care provider may ask you questions about:  Alcohol, tobacco, and drug use.  Emotional well-being.  Home and relationship well-being.  Sexual activity.  Eating habits.  History of falls.  Memory and ability to understand (cognition).  Work and work Statistician.  Pregnancy and menstrual history. What immunizations do I need?  Influenza (flu) vaccine  This is recommended every year. Tetanus, diphtheria, and pertussis (Tdap) vaccine  You may need a Td booster every 10 years. Varicella (chickenpox) vaccine  You may need this vaccine if you have not already been vaccinated. Zoster (shingles) vaccine  You may need this after age 98. Pneumococcal conjugate (PCV13) vaccine  One dose is recommended after age 63. Pneumococcal polysaccharide (PPSV23) vaccine  One dose is recommended after age 13. Measles, mumps, and rubella (MMR) vaccine  You may need at least one dose of MMR if you were born in 1957 or later. You may also need a second dose. Meningococcal conjugate (MenACWY) vaccine  You may need this if you have certain conditions. Hepatitis A vaccine  You may need this if you have certain conditions or if you travel or work in places where you may be exposed to hepatitis A. Hepatitis B vaccine  You may need this if you have certain conditions or if you travel or work in places where you may  be exposed to hepatitis B. Haemophilus influenzae type b (Hib) vaccine  You may need this if you have certain conditions. You may receive vaccines as individual doses or as more than one vaccine together in one shot (combination vaccines). Talk with your health care provider about the risks and benefits of combination vaccines. What tests do I need? Blood tests  Lipid and cholesterol levels. These may be checked every 5 years, or more frequently depending on your overall  health.  Hepatitis C test.  Hepatitis B test. Screening  Lung cancer screening. You may have this screening every year starting at age 52 if you have a 30-pack-year history of smoking and currently smoke or have quit within the past 15 years.  Colorectal cancer screening. All adults should have this screening starting at age 69 and continuing until age 25. Your health care provider may recommend screening at age 80 if you are at increased risk. You will have tests every 1-10 years, depending on your results and the type of screening test.  Diabetes screening. This is done by checking your blood sugar (glucose) after you have not eaten for a while (fasting). You may have this done every 1-3 years.  Mammogram. This may be done every 1-2 years. Talk with your health care provider about how often you should have regular mammograms.  BRCA-related cancer screening. This may be done if you have a family history of breast, ovarian, tubal, or peritoneal cancers. Other tests  Sexually transmitted disease (STD) testing.  Bone density scan. This is done to screen for osteoporosis. You may have this done starting at age 46. Follow these instructions at home: Eating and drinking  Eat a diet that includes fresh fruits and vegetables, whole grains, lean protein, and low-fat dairy products. Limit your intake of foods with high amounts of sugar, saturated fats, and salt.  Take vitamin and mineral supplements as recommended by your health care provider.  Do not drink alcohol if your health care provider tells you not to drink.  If you drink alcohol: ? Limit how much you have to 0-1 drink a day. ? Be aware of how much alcohol is in your drink. In the [place] one drink equals one 12 oz bottle of beer (355 mL), one 5 oz glass of wine (148 mL), or one 1 oz glass of hard liquor (44 mL). Lifestyle  Take daily care of your teeth and gums.  Stay active. Exercise for at least 30 minutes on 5 or more days  each week.  Do not use any products that contain nicotine or tobacco, such as cigarettes, e-cigarettes, and chewing tobacco. If you need help quitting, ask your health care provider.  If you are sexually active, practice safe sex. Use a condom or other form of protection in order to prevent STIs (sexually transmitted infections).  Talk with your health care provider about taking a low-dose aspirin or statin. What's next?  Go to your health care provider once a year for a well check visit.  Ask your health care provider how often you should have your eyes and teeth checked.  Stay up to date on all vaccines. This information is not intended to replace advice given to you by your health care provider. Make sure you discuss any questions you have with your health care provider. Document Released: 05/14/2015 Document Revised: 04/11/2018 Document Reviewed: 04/11/2018 Elsevier Patient Education  2020 [employer].

## 2019-01-16 NOTE — Progress Notes (Addendum)
Subjective:   [name] is a 78 y.o. female who presents for Medicare Annual   Review of Systems:   Cardiac Risk Factors include: advanced age (>63men, >81 women);dyslipidemia;hypertension Sleep patterns: gets up 1-2 times nightly to void and sleeps 5-6 hours nightly. Patient reports insomnia issues, discussed recommended sleep tips.   Home Safety/Smoke Alarms: Feels safe in home. Smoke alarms in place.  Living environment; residence and Firearm Safety: apartment. Lives with husband, no needs for DME, good support system, Resides at [facility] independent living. Seat Belt Safety/Bike Helmet: Wears seat belt.      Objective:     Vitals: BP 134/72   Pulse 73   Resp 18   Ht 5\' 3"  (1.6 m)   Wt 148 lb (67.1 kg)   SpO2 96%   BMI 26.22 kg/m   Body mass index is 26.22 kg/m.  Advanced Directives 01/16/2019 05/20/2018 01/10/2018 10/08/2017 06/18/2017 12/21/2016 06/28/2016  Does Patient Have a Medical Advance Directive? Yes Yes Yes Yes Yes Yes Yes  Type of Paramedic of [place];Living will [place];Living will [place];Living will Living will;Healthcare Power of [place];Living will [place];Living will [place];Living will  Does patient want to make changes to medical advance directive? - No - Patient declined - - - - No - Patient declined  Copy of [place] in Chart? No - copy requested No - copy requested No - copy requested No - copy requested - No - copy requested No - copy requested  Pre-existing out of facility DNR order (yellow form or pink MOST form) - - - - - - -    Tobacco Social History   Tobacco Use  Smoking Status Former Smoker  . Quit date: 12/04/1958  . Years since quitting: 60.1  Smokeless Tobacco Never Used     Counseling given: Not Answered  Past Medical History:  Diagnosis Date  . Abdominal aortic aneurysm  (HCC)    REPAIRED IN 1996 BY [name]  AND HAS RECENTLY BEEN FOLLOWED BY [name]  . 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 Adenomatous colon polyp    tubular  . Anemia   . CAD in native artery 1996, 2002, 2005    Status post CABG x1 with LIMA-LAD for ostial LAD 90% stenosis --> down to 50% in 2002 and 30% in 2005.;  Atretic LIMA; Myoview 06/2010: Fixed anteroseptal, apical and inferoapical defect with moderate size. Most likely scar. Mild subendocardial ischemia. EF 71% LOW RISK.   [name] Cervical disc disease    fracture  . Chronic kidney disease   . COPD (chronic obstructive pulmonary disease) ([place])   . Diverticulosis   . Hyperlipidemia   . Hypertension   . Hypothyroidism (acquired)    hypo  . Myocardial infarction [facility]) Aug. 2016   TIA  . Polymyalgia rheumatica (HCC)    2011 Dr. Charlestine Night  . Rheumatoid arthritis ([place]) 2011   Dr.Truslow; fracture knees, hands and wrists -   . S/P CABG x 1 1996   CABG--LIMA-LAD for ostial LAD (not felt to be PCI amenable). EF NORMAL then; LIMA now atretic  . Shortness of breath dyspnea    with exertion  . Stroke [facility]) 11-2014   TIA   . Urinary frequency    Past Surgical History:  Procedure Laterality Date  . ABDOMINAL  AORTIC ANEURYSM REPAIR  0932   Complicated by mesenteric artery stenosis and splenic artery infarction with acquired Asplenia  . APPENDECTOMY    . BUNIONECTOMY  07/2011   right foot  . CARDIAC CATHETERIZATION  2005   (Most recent CATH) - ostial LAD lesion 20-30% (down from 90% initially). Atretic LIMA. Minimal disease the RCA and Circumflex system.  [name] CARPAL TUNNEL RELEASE Left   . CATARACT EXTRACTION Bilateral   . CERVICAL SPINE SURGERY     plate 2008 Dr. Saintclair Halsted  . [place]   INCLUDED AN INTERNAL MAMMARY ARTERY TO THE LAD. EF WAS NORMAL  . INGUINAL HERNIA REPAIR Right   . LAPAROSCOPIC APPENDECTOMY N/A 06/28/2016   Procedure:  APPENDECTOMY LAPAROSCOPIC;  Surgeon: Leighton Ruff, MD;  Location: [facility];  Service: General;  Laterality: N/A;  . NM MYOVIEW LTD  March 2012   Fixed anteroseptal, apical and inferoapical defect with moderate size. Most likely scar. Mild subendocardial ischemia. EF 71% LOW RISK.   [name] SPLENECTOMY    . TRANSTHORACIC ECHOCARDIOGRAM  12/2014   [facility]: Normal LV size & function. EF 55-60%,   . vagina polyp    . VIDEO ASSISTED THORACOSCOPY (VATS)/WEDGE RESECTION Left 03/02/2015   Procedure: VIDEO ASSISTED THORACOSCOPY (VATS), MINI THORACOTOMY, LEFT UPPER LOBE WEDGE, TAKE DOWN OF INTERNAL MAMMARY LESIONS, PLACEMENT OF ON-Q PUMP;  Surgeon: Grace Isaac, MD;  Location: [place];  Service: Thoracic;  Laterality: Left;  [name] VIDEO BRONCHOSCOPY N/A 03/02/2015   Procedure: BRONCHOSCOPY;  Surgeon: Grace Isaac, MD;  Location: [place];  Service: Thoracic;  Laterality: N/A;  . VIDEO BRONCHOSCOPY WITH ENDOBRONCHIAL NAVIGATION N/A 10/08/2017   Procedure: VIDEO BRONCHOSCOPY WITH ENDOBRONCHIAL NAVIGATION WITH BIOPSIES OF LEFT UPPER LOBE AND LEFT LOWER LOBE;  Surgeon: Grace Isaac, MD;  Location: [facility];  Service: Thoracic;  Laterality: N/A;   Family History  Problem Relation Age of Onset  . Hypertension Mother   . Diabetes Mother   . Heart disease Mother   . Hyperlipidemia Mother   . Stroke Father   . Hyperlipidemia Sister   . Hypertension Sister   . Hypertension Daughter   . Cancer Paternal Uncle        Deceased from cancer not sure of site  . Hypertension Son   . Hyperlipidemia Son   . Hyperlipidemia Son   . Hypertension Son   . Hyperlipidemia Son   . Hypertension Son   . Hypertension Other   . Coronary artery disease Other   . Asthma Neg Hx   . Colon cancer Neg Hx    Social History   Socioeconomic History  . Marital status: Married    Spouse name: Not on file  . Number of children: 4  . Years of education: Not on file  . Highest education level: Not on file  Occupational  History  . Occupation: retired    Fish farm manager: RETIRED  Social Needs  . Financial resource strain: Not hard at all  . Food insecurity    Worry: Never true    Inability: Never true  . Transportation needs    Medical: No    Non-medical: No  Tobacco Use  . Smoking status: Former Smoker    Quit date: 12/04/1958    Years since quitting: 60.1  . Smokeless tobacco: Never Used  Substance and Sexual Activity  . Alcohol use: Yes    Comment: occassionally  . Drug use: No  . Sexual activity: Not Currently  Lifestyle  .  Physical activity    Days per week: 5 days    Minutes per session: 40 min  . Stress: Not at all  Relationships  . Social connections    Talks on phone: More than three times a week    Gets together: More than three times a week    Attends religious service: 1 to 4 times per year    Active member of club or organization: Yes    Attends meetings of clubs or organizations: More than 4 times per year    Relationship status: Married  Other Topics Concern  . Not on file  Social History Narrative   [facility]  [place] ; [place].    [facility] at [facility] since dec [facility]    Outpatient Encounter Medications as of 01/16/2019  Medication Sig  . acetaminophen (TYLENOL) 500 MG tablet Take 500 mg by mouth every 6 (six) hours as needed (pain).   [name] atorvastatin (LIPITOR) 80 MG tablet Take 1 tablet (80 mg total) by mouth daily.  . CHLORPHENIRAMINE-ACETAMINOPHEN PO Take 1 tablet by mouth 2 (two) times daily.  . Cholecalciferol (VITAMIN D3) 50 MCG (2000 UT) capsule Take 1 capsule (2,000 Units total) by mouth daily.  . clopidogrel (PLAVIX) 75 MG tablet Take 1 tablet (75 mg total) by mouth daily.  . famotidine (PEPCID) 20 MG tablet One at bedtime  . fluticasone furoate-vilanterol (BREO ELLIPTA) 100-25 MCG/INH AEPB Inhale 1 puff into the lungs daily.  . Investigational - Study Medication Take 3 capsules by mouth daily. Study name: Women's  Health Initiative Study COSMOS  Additional study details: 1 cap is Calcium, 2 caps are Coco extract  . leflunomide (ARAVA) 20 MG tablet Take 20 mg by mouth every other day.   . levothyroxine (SYNTHROID) 88 MCG tablet TAKE 1 TABLET BY MOUTH  DAILY BEFORE BREAKFAST.  KEEP SCHEDULED APPT FOR  FUTURE REFILLS  . loperamide (IMODIUM A-D) 2 MG tablet Take 2 mg by mouth 4 (four) times daily as needed for diarrhea or loose stools.  [name] losartan (COZAAR) 25 MG tablet Take 1 tablet (25 mg total) by mouth daily.  . metoprolol succinate (TOPROL-XL) 25 MG 24 hr tablet Take 1 tablet (25 mg total) by mouth daily.  . pantoprazole (PROTONIX) 40 MG tablet Take 1 tablet (40 mg total) by mouth daily. Take 30-60 min before first meal of the day  . psyllium (METAMUCIL) 58.6 % packet Take 1 packet by mouth daily.  [name] triamcinolone (NASACORT ALLERGY 24HR) 55 MCG/ACT AERO nasal inhaler Place 1 spray into the nose daily.    No facility-administered encounter medications on file as of 01/16/2019.     Activities of Daily Living In your present state of health, do you have any difficulty performing the following activities: 01/16/2019  Hearing? N  Vision? N  Difficulty concentrating or making decisions? N  Walking or climbing stairs? N  Dressing or bathing? N  Doing errands, shopping? N  Preparing Food and eating ? N  Using the Toilet? N  In the past six months, have you accidently leaked urine? N  Do you have problems with loss of bowel control? N  Managing your Medications? N  Managing your Finances? N  Housekeeping or managing your Housekeeping? N  Some recent data might be hidden    Patient Care Team: Plotnikov, [name], MD as PCP - General Leonie Man, MD as PCP - Cardiology (Cardiology) Leonie Man, MD as Consulting Physician (Cardiology)  Gavin Pound, MD as Consulting Physician (Rheumatology) Armbruster, Carlota Raspberry, MD as Consulting Physician (Gastroenterology) Tanda Rockers, MD as Consulting  Physician (Pulmonary Disease) Grace Isaac, MD as Consulting Physician (Cardiothoracic Surgery) Aloha Gell, MD as Consulting Physician (Obstetrics and Gynecology)    Assessment:   This is a routine wellness examination for [name]. Physical assessment deferred to PCP.  Exercise Activities and Dietary recommendations Current Exercise Habits: Home exercise routine, Type of exercise: walking, Time (Minutes): 20, Frequency (Times/Week): 3, Weekly Exercise (Minutes/Week): 60, Intensity: Mild, Exercise limited by: orthopedic condition(s) Diet (meal preparation, eat out, water intake, caffeinated beverages, dairy products, fruits and vegetables): in general, a "healthy" diet  , well balanced. eats a variety of fruits and vegetables daily, limits salt, fat/cholesterol, sugar,carbohydrates,caffeine, drinks 6-8 glasses of water daily.  Goals      Patient Stated   . 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 maintian current health status     Continue eat healthy, exercise more by walking more routinely, enjoy life and family       Fall Risk Fall Risk  01/16/2019 01/10/2018 12/21/2016 08/17/2016 11/26/2014  Falls in the past year? 0 No No No No  Number falls in past yr: 0 - - - -  Injury with Fall? 0 - - - -  Risk for fall due to : Impaired balance/gait - - - -  Risk for fall due to: Comment - - - - -    Depression Screen PHQ 2/9 Scores 01/16/2019 01/10/2018 12/21/2016 08/17/2016  PHQ - 2 Score 0 0 0 0  PHQ- 9 Score - - 2 -     Cognitive Function MMSE - Mini Mental State Exam 12/21/2016 11/26/2014  Not completed: - Unable to complete  Orientation to time 5 -  Orientation to Place 5 -  Registration 3 -  Attention/ Calculation 5 -  Recall 2 -  Language- name 2  objects 2 -  Language- repeat 1 -  Language- follow 3 step command 3 -  Language- read & follow direction 1 -  Write a sentence 1 -  Copy design 1 -  Total score 29 -       Ad8 score reviewed for issues:  Issues making decisions: no  Less interest in hobbies / activities: no  Repeats questions, stories (family complaining): no  Trouble using ordinary gadgets (microwave, computer, phone):no  Forgets the month or year: no  Mismanaging finances: no  Remembering appts: no  Daily problems with thinking and/or memory: no Ad8 score is= 0  Immunization History  Administered Date(s) Administered  . Influenza Whole 05/02/2001, 01/27/2008, 01/12/2010, 12/31/2010, 01/25/2012  . Influenza, High Dose Seasonal PF 01/14/2014, 02/13/2015, 12/21/2016, 01/09/2018, 12/29/2018  . Influenza,inj,Quad PF,6+ Mos 12/23/2015  . Influenza-Unspecified 02/03/2013, 01/13/2014, 02/13/2015  . Meningococcal Polysaccharide 03/21/2012  . PPD Test 11/18/2013  . Pneumococcal Conjugate-13 03/20/2013  . Pneumococcal Polysaccharide-23 05/02/2000, 06/23/2010, 02/26/2018  . Td 05/01/2006  . Tdap 06/01/2016  . Zoster 01/17/2006  . Zoster Recombinat (Shingrix) 05/16/2017, 07/16/2017   Screening Tests Health Maintenance  Topic Date Due  . COLONOSCOPY  03/30/2019  . TETANUS/TDAP  06/01/2026  . INFLUENZA VACCINE  Completed  . DEXA SCAN  Completed  . PNA  vac Low Risk Adult  Completed       Plan:    Reviewed health maintenance screenings with patient today and relevant education, vaccines, and/or referrals were provided.   I have personally reviewed and noted the following in the patient's chart:   . Medical and social history . Use of alcohol, tobacco or illicit drugs  . Current medications and supplements . Functional ability and status . Nutritional status . Physical activity . Advanced directives . List of other physicians . Vitals . Screenings to include cognitive, depression, and falls .  Referrals and appointments  In addition, I have reviewed and discussed with patient certain preventive protocols, quality metrics, and best practice recommendations. A written personalized care plan for preventive services as well as general preventive health recommendations were provided to patient.     Michiel Cowboy, RN  01/16/2019   Medical screening examination/treatment/procedure(s) were performed by non-physician practitioner and as supervising physician I was immediately available for consultation/collaboration. I agree with above. Lew Dawes, MD

## 2019-01-22 ENCOUNTER — Ambulatory Visit: Payer: Medicare Other | Admitting: Internal Medicine

## 2019-01-22 ENCOUNTER — Ambulatory Visit (INDEPENDENT_AMBULATORY_CARE_PROVIDER_SITE_OTHER): Payer: Medicare Other

## 2019-01-22 ENCOUNTER — Telehealth: Payer: Self-pay | Admitting: Internal Medicine

## 2019-01-22 ENCOUNTER — Other Ambulatory Visit: Payer: Self-pay

## 2019-01-22 ENCOUNTER — Telehealth: Payer: Self-pay | Admitting: Acute Care

## 2019-01-22 ENCOUNTER — Ambulatory Visit (INDEPENDENT_AMBULATORY_CARE_PROVIDER_SITE_OTHER): Payer: Medicare Other | Admitting: Acute Care

## 2019-01-22 DIAGNOSIS — R911 Solitary pulmonary nodule: Secondary | ICD-10-CM | POA: Diagnosis not present

## 2019-01-22 DIAGNOSIS — R0609 Other forms of dyspnea: Secondary | ICD-10-CM

## 2019-01-22 DIAGNOSIS — J449 Chronic obstructive pulmonary disease, unspecified: Secondary | ICD-10-CM

## 2019-01-22 DIAGNOSIS — R7989 Other specified abnormal findings of blood chemistry: Secondary | ICD-10-CM

## 2019-01-22 LAB — CBC WITH DIFFERENTIAL/PLATELET
Basophils Absolute: 0.2 10*3/uL — ABNORMAL HIGH (ref 0.0–0.1)
Basophils Relative: 1.5 % (ref 0.0–3.0)
Eosinophils Absolute: 0.2 10*3/uL (ref 0.0–0.7)
Eosinophils Relative: 1.5 % (ref 0.0–5.0)
HCT: 39 % (ref 36.0–46.0)
Hemoglobin: 12.7 g/dL (ref 12.0–15.0)
Lymphocytes Relative: 16.1 % (ref 12.0–46.0)
Lymphs Abs: 1.8 10*3/uL (ref 0.7–4.0)
MCHC: 32.6 g/dL (ref 30.0–36.0)
MCV: 94.1 fl (ref 78.0–100.0)
Monocytes Absolute: 1.5 10*3/uL — ABNORMAL HIGH (ref 0.1–1.0)
Monocytes Relative: 13 % — ABNORMAL HIGH (ref 3.0–12.0)
Neutro Abs: 7.7 10*3/uL (ref 1.4–7.7)
Neutrophils Relative %: 67.9 % (ref 43.0–77.0)
Platelets: 383 10*3/uL (ref 150.0–400.0)
RBC: 4.15 Mil/uL (ref 3.87–5.11)
RDW: 15 % (ref 11.5–15.5)
WBC: 11.4 10*3/uL — ABNORMAL HIGH (ref 4.0–10.5)

## 2019-01-22 LAB — D-DIMER, QUANTITATIVE: D-Dimer, Quant: 0.61 mcg/mL FEU — ABNORMAL HIGH (ref <0.50)

## 2019-01-22 LAB — BASIC METABOLIC PANEL
BUN: 38 mg/dL — ABNORMAL HIGH (ref 6–23)
CO2: 24 mEq/L (ref 19–32)
Calcium: 10.1 mg/dL (ref 8.4–10.5)
Chloride: 98 mEq/L (ref 96–112)
Creatinine, Ser: 1.66 mg/dL — ABNORMAL HIGH (ref 0.40–1.20)
GFR: 29.68 mL/min — ABNORMAL LOW (ref >60.00)
Glucose, Bld: 108 mg/dL — ABNORMAL HIGH (ref 70–99)
Potassium: 4.3 mEq/L (ref 3.5–5.1)
Sodium: 134 mEq/L — ABNORMAL LOW (ref 135–145)

## 2019-01-22 MED ORDER — ALBUTEROL SULFATE HFA 108 (90 BASE) MCG/ACT IN AERS: 2.0000 | INHALATION_SPRAY | Freq: Four times a day (QID) | RESPIRATORY_TRACT | 2 refills | Status: DC | PRN

## 2019-01-22 NOTE — Telephone Encounter (Signed)
Spoke with the pt and notified of recommendations per Judson Roch  She verbalized understanding and wanted to go ahead and do a televisit this morning to address her symptoms  Appt with Sarah scheduled at 11:30 am

## 2019-01-22 NOTE — Telephone Encounter (Signed)
Please call patient and let her know her CXR was fine. Let her know her d dimer was a bit elevated so we will do a CTA. Thanks so much

## 2019-01-22 NOTE — Patient Instructions (Addendum)
It is good to talk to you today. We will do a CXR today  We will call you with the results We will do some lab work ( D dimer, CBC, and BMET) We will schedule you CT Chest within the week.  We will schedule Pulmonary Function Test  You will be called with time and date, and where to get COVID tested. We will send in a prescription for a rescue inhaler  Use this as needed for shortness of breath or wheezing up to 3 times daily Continue using Breo 1 puff once daily Rinse mouth after use Follow up with Dr. Melvyn Novas in 2 weeks  Please contact office for sooner follow up if symptoms do not improve or worsen or seek emergency care

## 2019-01-22 NOTE — Telephone Encounter (Signed)
Pt can have a tele visit or video visit with me this morning, I have 11:30 open , or with Dr. Melvyn Novas today at 3 pm ( his open slot). She is his patient. Based on that call we can determine plan of care and possible OV with COVID testing first to ensure safety the patient is seen in the safest manner possible.[name]

## 2019-01-22 NOTE — Progress Notes (Signed)
Virtual Visit via Telephone Note  I connected with [name] on 01/22/19 at 11:30 AM EDT by telephone and verified that I am speaking with the correct person using two identifiers.  Location: Patient: At home Provider: At the office   I discussed the limitations, risks, security and privacy concerns of performing an evaluation and management service by telephone and the availability of in person appointments. I also discussed with the patient that there may be a patient responsible charge related to this service. The patient expressed understanding and agreed to proceed.  78 year old female former smoker with    History of Present Illness: Pt. Presents for tele visit. She states he has felt more breathless over the last 6 months-year.She states she now has shortness of breath with minimal exertion. She states this has been a very gradual change. She states her congestion and coughing is worse in the Fall and the Winter.  She states she can't take a shower or walk on flat surfaces without feeling breathless. Patient walked and put on pulse ox for me while on phone O2 sat 96% patient still felt breathless. Patient has a cough from post nasal drip, unable to cough anything up, denies fever, n/v/d, wheezing. Patient states she did have body aches Sunday-Tuesday., (9/20-9/22).She states these are common for her due to RA. She is taking ARAVA 20 mg daily  for her her RA. She denies any fever, chest pain, orthopnea or hemoptysis.  She is followed closely by Dr. Servando Snare for abnormal pulmonary nodules. She has had 3 biopsies all of which have been negative. She is over due for her 6 month follow up scan. We will get it done as soon as possible. She states her oxygen saturations do not drop with movement.    Observations/Objective: 12/22/2018 D-Dimer, Quant <0.50 mcg/mL FEU 0.61High     WBC 11.4 Creatinine 1.66 GFR 29.68  CT Chest 03/2018 There has been interval increase in size of the left  lower lobe pulmonary nodule, suspicious for primary bronchogenic carcinoma. Pulmonary nodule within the RIGHT lower lobe measures 1.7 by 1.6 cm, image 135/8. Previously 1.2 x 1.2 cm.  Pulmonary nodule within the LEFT lower lobe measures 1.7 by 1.6 cm, image 135/8. Previously 1.2 x 1.2 cm.  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 This measures up to 2.2 cm.  Stable left lower lobe pulmonary nodule. Borderline and mildly enlarged mediastinal lymph nodes > f/u Dr Servando Snare:    10/08/17  Excisional bx LUL c/w nec granuloma     Assessment and Plan: Worsening Dyspnea COPD Plan We will do a CXR today  We will call you with the results We will do some lab work ( D dimer, CBC, and BMET) We will schedule you for a  CT Chest tomorrow We will schedule Pulmonary Function Test  You will be called with time and date, and where to get COVID tested. We will send in a prescription for a rescue inhaler  Use this as needed for shortness of breath or wheezing up to 3 times daily Continue using Breo 1 puff once daily Rinse mouth  after use Follow up with Dr. Melvyn Novas in 2 weeks  Please contact office for sooner follow up if symptoms do not improve or worsen or seek emergency care     Follow Up Instructions: Follow up in 2 weeks with Dr. Melvyn Novas   I discussed the assessment and treatment plan with the patient. The patient was provided an opportunity to ask questions and all were answered. The patient agreed with the plan and demonstrated an understanding of the instructions.   The patient was advised to call back or seek an in-person evaluation if the  symptoms worsen or if the condition fails to improve as anticipated.  This appointment was 30 min long with over 50% of the time in direct face-to-face patient care, assessment, plan of care, and follow-up.  Sherry Spatz, NP 01/22/2019 5:31 PM

## 2019-01-22 NOTE — Telephone Encounter (Signed)
Called spoke with patient, she states she has felt more breathless over the last 29months-year. Patient can't take a shower or walk on flat surfaces without feeling breathless. Patient walked and put on pulse ox for me while on phone O2 sat 96% patient still felt breathless. Patient has a cough from post nasal drip, unable to cough anything up, denies fever, n/v/d, wheezing. Patient states she did have body aches Sunday-Tuesday., (9/20-9/22).  No travel has everything delivered to their house.   Patient wants to be seen in office,  SG please advise OV or tele

## 2019-01-23 ENCOUNTER — Telehealth: Payer: Self-pay | Admitting: Acute Care

## 2019-01-23 ENCOUNTER — Ambulatory Visit (HOSPITAL_COMMUNITY)
Admission: RE | Admit: 2019-01-23 | Discharge: 2019-01-23 | Disposition: A | Discharge status: Home / Self Care | Payer: Medicare Other | Source: Ambulatory Visit | Attending: Acute Care | Admitting: Acute Care

## 2019-01-23 ENCOUNTER — Other Ambulatory Visit: Payer: Self-pay | Admitting: Internal Medicine

## 2019-01-23 ENCOUNTER — Other Ambulatory Visit: Payer: Self-pay

## 2019-01-23 DIAGNOSIS — R7989 Other specified abnormal findings of blood chemistry: Secondary | ICD-10-CM

## 2019-01-23 DIAGNOSIS — R0609 Other forms of dyspnea: Secondary | ICD-10-CM

## 2019-01-23 DIAGNOSIS — R0602 Shortness of breath: Secondary | ICD-10-CM

## 2019-01-23 NOTE — Telephone Encounter (Signed)
Called and briefly spoke to [name].  Patient's GFR is too low for CT angio so they wanted to know what to do.  I was able to transfer the call to [facility] as Dr. Melvyn Novas was coming out of the room.

## 2019-01-23 NOTE — Telephone Encounter (Signed)
Spoke with [name]  Pt was to have CTAngio  Pt's GFR was 29 and they can not do the CT Angio on any pt with GFR lower than 30  Per MW- order VQ and cxr  Orders placed

## 2019-01-24 ENCOUNTER — Encounter: Payer: Self-pay | Admitting: Acute Care

## 2019-01-24 ENCOUNTER — Telehealth: Payer: Self-pay | Admitting: Acute Care

## 2019-01-24 NOTE — Telephone Encounter (Signed)
Received notification from Vallarie Mare, patient care coordinator, that patient left ED because they were going to have patient wait and be triaged, etc in ED prior to VQ.  Vallarie Mare reported she has scheduled patient for VQ scan on Monday.  Called Derl Barrow, NP and reviewed labs and the issues the patient has had.  D-dimer is only slightly elevated so Beth advised that it is okay to wait for the VQ on 01/27/2019 and to advise patient if she experiences any chest pain or acute shortness of breath to go to the emergency room.  Called patient and advised per [facility].  Patient verbalized understanding and was thankful for the call. Nothing further needed at this time.

## 2019-01-24 NOTE — Telephone Encounter (Signed)
Called and spoke to patient. Explained to patient why the CT angio was not done and what was ordered in place.  Spoke with [facility] who will be arranging VQ scan.  Routing to [employer]

## 2019-01-24 NOTE — Telephone Encounter (Signed)
Patient is returning call. CB is [phone]

## 2019-01-24 NOTE — Telephone Encounter (Signed)
Pt went to the hosp yesterday to do the cta they called over to change the order due to her kidney function it was changed to a vq scan we got the auth for it thinking they were going to do the patient while she was there but they sent her home. I have scheduled it for today but she will have to go through the ED [facility] pt aware and call report will be sent to Dr Melvyn Novas.

## 2019-01-24 NOTE — Telephone Encounter (Signed)
Attempted to call patient, no answer, left message to call back.  To explain what happened yesterday with patient unable to get CT see below.  "Spoke with [name]  Pt was to have CTAngio  Pt's GFR was 29 and they can not do the CT Angio on any pt with GFR lower than 30  Per MW- order VQ and cxr  Orders placed "

## 2019-01-27 ENCOUNTER — Other Ambulatory Visit: Payer: Self-pay | Admitting: Internal Medicine

## 2019-01-27 ENCOUNTER — Ambulatory Visit (HOSPITAL_COMMUNITY)
Admission: RE | Admit: 2019-01-27 | Discharge: 2019-01-27 | Disposition: A | Discharge status: Home / Self Care | Payer: Medicare Other | Source: Ambulatory Visit | Attending: Internal Medicine | Admitting: Internal Medicine

## 2019-01-27 ENCOUNTER — Encounter (HOSPITAL_COMMUNITY)
Admission: RE | Admit: 2019-01-27 | Discharge: 2019-01-27 | Disposition: A | Discharge status: Home / Self Care | Payer: Medicare Other | Source: Ambulatory Visit | Attending: Internal Medicine | Admitting: Internal Medicine

## 2019-01-27 ENCOUNTER — Ambulatory Visit (HOSPITAL_COMMUNITY): Payer: Medicare Other

## 2019-01-27 ENCOUNTER — Other Ambulatory Visit: Payer: Self-pay

## 2019-01-27 DIAGNOSIS — R0602 Shortness of breath: Secondary | ICD-10-CM | POA: Insufficient documentation

## 2019-01-27 DIAGNOSIS — R7989 Other specified abnormal findings of blood chemistry: Secondary | ICD-10-CM | POA: Diagnosis present

## 2019-01-27 MED ORDER — TECHNETIUM TO 99M ALBUMIN AGGREGATED
1.5500 | Freq: Once | INTRAVENOUS | Status: AC | PRN
  Administered 2019-01-27: 14:00:00 1.55 via INTRAVENOUS

## 2019-01-28 ENCOUNTER — Telehealth: Payer: Self-pay | Admitting: Internal Medicine

## 2019-01-28 DIAGNOSIS — J449 Chronic obstructive pulmonary disease, unspecified: Secondary | ICD-10-CM

## 2019-01-28 NOTE — Telephone Encounter (Signed)
I spoke with pt on Thursday, can close encounter.

## 2019-01-28 NOTE — Telephone Encounter (Signed)
It's in [facility] result note file which someone should be checking as she is not here  But basically it is nl, no evidence of a blood clot.  If not doing better will need to move up appt with me to next available and bring all active meds for a "regroup" as her copd in the past has been only very mild so we don't have a good explanation for her symptoms

## 2019-01-28 NOTE — Telephone Encounter (Signed)
Patient has been contacted regarding results. See result note. Nothing further needed at this time.

## 2019-01-28 NOTE — Telephone Encounter (Signed)
Call returned to patient, requesting results of VQ scan.   MW please advise. Thanks.

## 2019-01-30 ENCOUNTER — Other Ambulatory Visit: Payer: Medicare Other

## 2019-02-05 ENCOUNTER — Ambulatory Visit: Payer: Medicare Other | Admitting: Internal Medicine

## 2019-02-06 ENCOUNTER — Other Ambulatory Visit: Payer: Self-pay

## 2019-02-06 ENCOUNTER — Encounter: Payer: Self-pay | Admitting: Internal Medicine

## 2019-02-06 ENCOUNTER — Ambulatory Visit: Payer: Medicare Other | Admitting: Internal Medicine

## 2019-02-06 DIAGNOSIS — R05 Cough: Secondary | ICD-10-CM | POA: Diagnosis not present

## 2019-02-06 DIAGNOSIS — R058 Other specified cough: Secondary | ICD-10-CM

## 2019-02-06 DIAGNOSIS — J449 Chronic obstructive pulmonary disease, unspecified: Secondary | ICD-10-CM

## 2019-02-06 MED ORDER — PREDNISONE 10 MG PO TABS: ORAL_TABLET | ORAL | 0 refills | Status: DC

## 2019-02-06 MED ORDER — BUDESONIDE-FORMOTEROL FUMARATE 80-4.5 MCG/ACT IN AERO: 2.0000 | INHALATION_SPRAY | Freq: Two times a day (BID) | RESPIRATORY_TRACT | 0 refills | Status: DC

## 2019-02-06 NOTE — Progress Notes (Signed)
Subjective:   Patient ID: [name], female    DOB: 1941-02-22     MRN: [id number]     Brief patient profile:  78  yowf  Only smoked age 78 - 58  s obvious sequelae with new cough while on a cruise in September 2016 in [place] > cxr > ct chest > PET > referred to pulmonary clinic 02/15/2015 by Dr  [name] UC with GOLD I copd 02/19/15     History of Present Illness  02/15/2015 1st [facility] office visit/ [name]   Chief Complaint  Patient presents with  . PULMONARY CONSULT    Pulmonary nodules. Pt reports some dyspnea with exertion, some morning dry to wet cough. Pt does not use any inhalers.   Cough better but some cc am congestion x years ? Worse in winter > total ? Usually swallows so can't say color or amt assoc with mild pnds  Doe only with exertion = MMRC1 = can walk nl pace, flat grade, can't hurry or go uphills or steps s mild sob  rec Please remember to go to the lab  department downstairs for your tests - we will call you with the results when they are available. 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 Follow-up    Coughing more and having more PND. Cough is non prod. She is not using her albuterol inhaler.   after stirs has  lots of nonproductive coughing assoc with sense of pnds  no better on anoro,  Not limited by breathing from desired activities   rec Stop anoro and montelukast to see what difference this makes For drainage / throat tickle try take CHLORPHENIRAMINE  4 mg - take one every 4 hours as needed -  available over the counter-   - late add:  pred x 6 days prn flare x one - consider adding gabapentin next ov    09/26/2016  f/u ov/[name] re: GOLD I copd [name] / no worse breathing or coughing off anoro/montelukast but maint on nasacort  Chief Complaint  Patient presents with  . Follow-up    Breathing is doing well and she is not coughing much. No new co's.  doe = MMRC1 = can walk nl pace, flat grade, can't hurry or go uphills or steps s sob   Only a little drainage p one chlorpheniramine in am and double strength at hs and sleep fine now rec Continue protonix and pepcid for another 2 months then fine to try to taper  off by stopping the protonix first and changing the pepcid 20 mg twice daily then taper the pepcid off as well and see if cough flares or not - if does flare, restart and you may need GI evaluation  For drainage / throat tickle try take CHLORPHENIRAMINE  4 mg - take one every 4 hours as needed - available over the counter- may cause drowsiness so start with just a bedtime dose or two and see how  you tolerate it before trying in daytime   If not satisfied we start you on gabapentin 100 mg three times daily     10/08/17  Excisional bx LUL c/w nec granuloma      12/03/2017 acute extended ov/[name] re: re-establish  sob gradually downhill x one year then much worse since June 2019 Chief Complaint  Patient presents with  . Acute Visit    Increased cough since June- prod but unsure sputum color. She states her breathing has been gradually worse since her last visit May 2019.   still using chlorpheniramine  And still sleeping fine but wakes up feeling very congested at usual hour and then all during the day has same sensation but not coughing up excess/purulent mucus Doe gradually worse x one year  = MMRC2 = can't walk a nl pace on a flat grade s sob but does fine slow and flat  Arthritis good control on arava  rec For drainage / throat tickle try take CHLORPHENIRAMINE  4 mg - take  one every 4 hours as needed - available over the counter- may cause drowsiness so start with just a bedtime dose or two and see how you tolerate it before trying in daytime   Pantoprazole (protonix) 40 mg   Take  30-60 min before first meal of the day and Pepcid (famotidine)  20 mg one @  bedtime until return to office - this is the best way to tell whether stomach acid is contributing to your problem.   GERD diet  Pl   Add if cough/sob worse > Prednisone 10 mg take  4 each am x 2 days,   2 each am x 2 days,  1 each am x 2 days and stop       01/14/2018  f/u ov/[name] re:  GOLD I copd with reversible component . Did not take the prednisone  Chief Complaint  Patient presents with  . Follow-up    PFT's done today. Her cough and SOB are about the same. No new co's.   Dyspnea:  MMRC1 = can walk nl pace, flat grade, can't hurry or go uphills or steps s sob   Cough: more of throat clearing/ daytime x years / p  rx fosfamax >>  worse since then  Sleeping: flat bed / one pillow SABA use: none 02: none  rec Start BREO 426 one click each am x 2 drags each am  Try off fosfamax for now  Please schedule a follow up office visit in 6 weeks, call sooner if needed     02/25/2018  f/u ov/[name] re: COPD GOLD 1/ maint breo and no fosfamax since last ov  Chief Complaint  Patient presents with  . Follow-up    Breathing is unchanged. She does not have a rescue inhaler.   Dyspnea:   MMRC1 = can walk nl pace, flat grade, can't hurry or go uphills or steps s sob   Cough: no longer  Coughing/ not  Much throat clearing  Sleeping: bed is flat/ one pillow rec Ok to start back on fosfamax with no other changes for now to see what difference it make but if upper airway symptoms are worse you will need alternative (Recast/ prolia)  Remember to brush your teeth and tongue with [employer] based toothpaste and if not better we need to see you back for a different inhaler (you will need to bring your drug formulary  with you )     04/30/2018  f/u ov/[name] re:  Copd GOLD I/ maint breo/ back on fosfamax > more throat clearing despite rx for gerd Chief - 58  s obvious sequelae with new cough while on a cruise in September 2016 in [place] > cxr > ct chest > PET > referred to pulmonary clinic 02/15/2015 by Dr  [name] UC with GOLD I copd 02/19/15     History of Present Illness  02/15/2015 1st [facility] office visit/ [name]   Chief Complaint  Patient presents with  . PULMONARY CONSULT    Pulmonary nodules. Pt reports some dyspnea with exertion, some morning dry to wet cough. Pt does not use any inhalers.   Cough better but some cc am congestion x years ? Worse in winter > total ? Usually swallows so can't say color or amt assoc with mild pnds  Doe only with exertion = MMRC1 = can walk nl pace, flat grade, can't hurry or go uphills or steps s mild sob  rec Please remember to go to the lab  department downstairs for your tests - we will call you with the results when they are available. 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 Follow-up    Coughing more and having more PND. Cough is non prod. She is not using her albuterol inhaler.   after stirs has  lots of nonproductive coughing assoc with sense of pnds  no better on anoro,  Not limited by breathing from desired activities   rec Stop anoro and montelukast to see what difference this makes For drainage / throat tickle try take CHLORPHENIRAMINE  4 mg - take one every 4 hours as needed -  available over the counter-   - late add:  pred x 6 days prn flare x one - consider adding gabapentin next ov    09/26/2016  f/u ov/[name] re: GOLD I copd [name] / no worse breathing or coughing off anoro/montelukast but maint on nasacort  Chief Complaint  Patient presents with  . Follow-up    Breathing is doing well and she is not coughing much. No new co's.  doe = MMRC1 = can walk nl pace, flat grade, can't hurry or go uphills or steps s sob   Only a little drainage p one chlorpheniramine in am and double strength at hs and sleep fine now rec Continue protonix and pepcid for another 2 months then fine to try to taper  off by stopping the protonix first and changing the pepcid 20 mg twice daily then taper the pepcid off as well and see if cough flares or not - if does flare, restart and you may need GI evaluation  For drainage / throat tickle try take CHLORPHENIRAMINE  4 mg - take one every 4 hours as needed - available over the counter- may cause drowsiness so start with just a bedtime dose or two and see how  you tolerate it before trying in daytime   If not satisfied we start you on gabapentin 100 mg three times daily     10/08/17  Excisional bx LUL c/w nec granuloma      12/03/2017 acute extended ov/[name] re: re-establish  sob gradually downhill x one year then much worse since June 2019 Chief Complaint  Patient presents with  . Acute Visit    Increased cough since June- prod but unsure sputum color. She states her breathing has been gradually worse since her last visit May 2019.   still using chlorpheniramine  And still sleeping fine but wakes up feeling very congested at usual hour and then all during the day has same sensation but not coughing up excess/purulent mucus Doe gradually worse x one year  = MMRC2 = can't walk a nl pace on a flat grade s sob but does fine slow and flat  Arthritis good control on arava  rec For drainage / throat tickle try take CHLORPHENIRAMINE  4 mg - take  one every 4 hours as needed - available over the counter- may cause drowsiness so start with just a bedtime dose or two and see how you tolerate it before trying in daytime   Pantoprazole (protonix) 40 mg   Take  30-60 min before first meal of the day and Pepcid (famotidine)  20 mg one @  bedtime until return to office - this is the best way to tell whether stomach acid is contributing to your problem.   GERD diet  Pl   Add if cough/sob worse > Prednisone 10 mg take  4 each am x 2 days,   2 each am x 2 days,  1 each am x 2 days and stop       01/14/2018  f/u ov/[name] re:  GOLD I copd with reversible component . Did not take the prednisone  Chief Complaint  Patient presents with  . Follow-up    PFT's done today. Her cough and SOB are about the same. No new co's.   Dyspnea:  MMRC1 = can walk nl pace, flat grade, can't hurry or go uphills or steps s sob   Cough: more of throat clearing/ daytime x years / p  rx fosfamax >>  worse since then  Sleeping: flat bed / one pillow SABA use: none 02: none  rec Start BREO 426 one click each am x 2 drags each am  Try off fosfamax for now  Please schedule a follow up office visit in 6 weeks, call sooner if needed     02/25/2018  f/u ov/[name] re: COPD GOLD 1/ maint breo and no fosfamax since last ov  Chief Complaint  Patient presents with  . Follow-up    Breathing is unchanged. She does not have a rescue inhaler.   Dyspnea:   MMRC1 = can walk nl pace, flat grade, can't hurry or go uphills or steps s sob   Cough: no longer  Coughing/ not  Much throat clearing  Sleeping: bed is flat/ one pillow rec Ok to start back on fosfamax with no other changes for now to see what difference it make but if upper airway symptoms are worse you will need alternative (Recast/ prolia)  Remember to brush your teeth and tongue with [employer] based toothpaste and if not better we need to see you back for a different inhaler (you will need to bring your drug formulary  with you )     04/30/2018  f/u ov/[name] re:  Copd GOLD I/ maint breo/ back on fosfamax > more throat clearing despite rx for gerd Chief Complaint  Patient presents with  . Follow-up    Breathing is unchanged. No new co's. She does not have a rescue inhaler.   Dyspnea:  Still mmrc1  Cough: min throat clearing worse on fosfamax  Sleeping: bed is flat/ one or two pillows SABA use: none  02: none rec Try off fosfamax now  to see if throat clearing is better and if it is you Prolia or Reclast as options If not better after 3 months, bring your formulary with you and we'll try you on a different inhaler- try off the breo for a week prior to the visit you can   02/06/2019  f/u ov/Angelli Baruch re:  Gradually downhill back on biphosphonates /Breo with only GOLD I criteria previously  Chief Complaint  Patient presents with  . Follow-up    Breathing is no better since last visit. She is getting SOB with exertion such as just standing up. She rarely uses her albuterol inhaler, does not feel it helps. She has non prod cough.   Dyspnea:  MMRC3 = can't walk 100 yards even at a slow pace at a flat grade s stopping due to sob   Cough: worse in fall and winter but day > noct with sensation of globus Sleeping: flat / one pillow no resp symptoms noct  SABA use: rarely, not helping  02: none    No obvious day to day or daytime variability or assoc excess/ purulent sputum or mucus plugs or hemoptysis or cp or chest tightness, subjective wheeze or overt sinus or hb symptoms.    without nocturnal  or early am exacerbation  of respiratory  c/o's or need for noct saba. Also denies any obvious fluctuation of symptoms with weather or environmental changes or other aggravating or alleviating factors except as outlined above   No unusual exposure hx or h/o childhood pna/ asthma or knowledge of premature birth.  Current Allergies, Complete Past Medical History, Past Surgical History, Family History, and Social History  were reviewed in [employer] record.  ROS  The following are not active complaints unless bolded Hoarseness, sore throat, dysphagia, dental problems, itching, sneezing,  nasal congestion or discharge of excess mucus or purulent secretions, ear ache,   fever, chills, sweats, unintended wt loss or wt gain, classically pleuritic or exertional cp,  orthopnea pnd or arm/hand swelling  or leg swelling, presyncope, palpitations, abdominal pain, anorexia, nausea, vomiting, diarrhea  or change in bowel habits or change in bladder habits, change in stools or change in urine, dysuria, hematuria,  rash, arthralgias, visual complaints, headache, numbness, weakness or ataxia or problems with walking or coordination,  change in mood or  memory.        Current Meds  Medication Sig  . acetaminophen (TYLENOL) 500 MG tablet Take 500 mg by mouth every 6 (six) hours as needed (pain).   [name] albuterol (VENTOLIN HFA) 108 (90 Base) MCG/ACT inhaler Inhale 2 puffs into the lungs every 6 (six) hours as needed for wheezing or shortness of breath.  [name] atorvastatin (LIPITOR) 80 MG tablet Take 1 tablet (80 mg total) by mouth daily.  . CHLORPHENIRAMINE-ACETAMINOPHEN PO Take 1 tablet by mouth 2 (two) times daily.  . Cholecalciferol (VITAMIN D3) 50 MCG (2000 UT) capsule Take 1 capsule (2,000 Units total) by mouth daily.  . clopidogrel (PLAVIX) 75 MG tablet Take 1 tablet (75 mg total)  by mouth daily.  . famotidine (PEPCID) 20 MG tablet One at bedtime  . fluticasone furoate-vilanterol (BREO ELLIPTA) 100-25 MCG/INH AEPB Inhale 1 puff into the lungs daily.  . Investigational - Study Medication Take 3 capsules by mouth daily. Study name: Women's Health Initiative Study COSMOS  Additional study details: 1 cap is Calcium, 2 caps are Coco extract  . leflunomide (ARAVA) 20 MG tablet Take 20 mg by mouth every other day.   . levothyroxine (SYNTHROID) 88 MCG tablet TAKE 1 TABLET BY MOUTH  DAILY BEFORE BREAKFAST.  KEEP  SCHEDULED APPT FOR  FUTURE REFILLS  . loperamide (IMODIUM A-D) 2 MG tablet Take 2 mg by mouth 4 (four) times daily as needed for diarrhea or loose stools.  [name] losartan (COZAAR) 25 MG tablet Take 1 tablet (25 mg total) by mouth daily.  . metoprolol succinate (TOPROL-XL) 25 MG 24 hr tablet Take 1 tablet (25 mg total) by mouth daily.  . pantoprazole (PROTONIX) 40 MG tablet Take 1 tablet (40 mg total) by mouth daily. Take 30-60 min before first meal of the day  . psyllium (METAMUCIL) 58.6 % packet Take 1 packet by mouth daily.  [name] triamcinolone (NASACORT ALLERGY 24HR) 55 MCG/ACT AERO nasal inhaler Place 1 spray into the nose daily.                 Objective:   Physical Exam  amb wf nad with vigorous throat clearing  02/06/2019         148 04/30/2018      148 01/14/2018        144  12/03/2017          147  09/26/2016        141  08/07/2016          146  06/19/2016        152  05/22/2016        148   02/15/15 155 lb 6.4 oz (70.489 kg)  01/29/15 154 lb (69.854 kg)  11/26/14 153 lb 8 oz (69.627 kg)     Vital signs reviewed - Note on arrival 02 sats  97% on RA  HEENT : pt wearing mask not removed for exam due to covid - 19 concerns.   NECK :  without JVD/Nodes/TM/ nl carotid upstrokes bilaterally   LUNGS: no acc muscle use,  Min barrel  contour chest wall with bilateral  slightly decreased bs s audible wheeze and  without cough on insp or exp maneuvers and min  Hyperresonant  to  percussion bilaterally     CV:  RRR  no s3 or murmur or increase in P2, and no edema   ABD:  soft and nontender with pos end  insp Hoover's  in the supine position. No bruits or organomegaly appreciated, bowel sounds nl  MS:   Nl gait/  ext warm without deformities, calf tenderness, cyanosis or clubbing No obvious joint restrictions   SKIN: warm and dry without lesions    NEURO:  alert, approp, nl sensorium with  no motor or cerebellar deficits apparent.         I personally reviewed images and agree  with radiology impression as follows:  CXR:   01/27/19   COPD changes with minimal bibasilar atelectasis or scarring.  No acute abnormalities.  Vq same date:  Very low prob   Labs reviewed:      Chemistry      Component Value Date/Time   NA 134 (L) 01/22/2019 1418   K  4.3 01/22/2019 1418   CL 98 01/22/2019 1418   CO2 24 01/22/2019 1418   BUN 38 (H) 01/22/2019 1418   CREATININE 1.66 (H) 01/22/2019 1418   CREATININE 1.51 (H) 04/06/2014 1543      Component Value Date/Time   CALCIUM 10.1 01/22/2019 1418   ALKPHOS 104 11/13/2018 1125   AST 23 11/13/2018 1125   ALT 28 11/13/2018 1125   BILITOT 0.7 11/13/2018 1125        Lab Results  Component Value Date   WBC 11.4 (H) 01/22/2019   HGB 12.7 01/22/2019   HCT 39.0 01/22/2019   MCV 94.1 01/22/2019   PLT 383.0 01/22/2019     Lab Results  Component Value Date   DDIMER 0.61 (H) 01/22/2019      Lab Results  Component Value Date   TSH 2.57 11/13/2018     Lab Results  Component Value Date   PROBNP 254.0 (H) 12/03/2017       Lab Results  Component Value Date   ESRSEDRATE 27 12/03/2017   ESRSEDRATE 20 12/14/2015   ESRSEDRATE 9 09/04/2012           Assessment & Plan:

## 2019-02-06 NOTE — Patient Instructions (Addendum)
Pantoprazole (protonix) 40 mg   Take  30-60 min before first meal of the day and Pepcid (famotidine)  20 mg one after supper  until return to office - this is the best way to tell whether stomach acid is contributing to your problem and stop the risedronate for now   GERD (REFLUX)  is an extremely common cause of respiratory symptoms just like yours , many times with no obvious heartburn at all.    It can be treated with medication, but also with lifestyle changes including elevation of the head of your bed (ideally with 6 -8inch blocks under the headboard of your bed),  Smoking cessation, avoidance of late meals, excessive alcohol, and avoid fatty foods, chocolate, peppermint, colas, red wine, and acidic juices such as orange juice.  NO MINT OR MENTHOL PRODUCTS SO NO COUGH DROPS  USE SUGARLESS CANDY INSTEAD (Jolley ranchers or Stover's or Life Savers) or even ice chips will also do - the key is to swallow to prevent all throat clearing. NO OIL BASED VITAMINS - use powdered substitutes.  Avoid fish oil when coughing.  Prednisone 10 mg take  4 each am x 2 days,   2 each am x 2 days,  1 each am x 2 days and stop   Stop breo and start symbicort 80 Take 2 puffs first thing in am and then another 2 puffs about 12 hours later    Please schedule a follow up office visit in 2 weeks, sooner if needed  with all medications /inhalers/ solutions in hand so we can verify exactly what you are taking. This includes all medications from all doctors and over the counters

## 2019-02-07 ENCOUNTER — Encounter: Payer: Self-pay | Admitting: Internal Medicine

## 2019-02-07 NOTE — Assessment & Plan Note (Addendum)
Quit smoking in her 92s - PFT's  02/19/15   FEV1 1.78 (90 % ) ratio 66  p 12 % improvement from saba p nothing  prior to study with DLCO  71 % corrects to 78 % for alv volume  And classical curvature  - Spirometry 06/19/2016  FEV1 1.65  (84%)  Ratio 69  With no prior rx  - PFT's  01/14/2018  FEV1 1.68 (90 % ) ratio 68  p 18 % improvement from saba p nothing  prior to study with DLCO  84 % corrects to 90  % for alv volume  - 01/14/2018    try BREO 100 each am > worse 02/06/2019 so d/c'd   - 02/06/2019  After extensive coaching inhaler device,  effectiveness =   75% with hfa > cahnge to symb 80 2bid and add spacer next ov if uacs component not improved  - 02/06/2019   Walked RA x one lap =  approx 250 ft - stopped due to  Sob with sats 95% avg pace   DDX of  difficult airways management almost all start with A and  include Adherence, Ace Inhibitors, Acid Reflux, Active Sinus Disease, Alpha 1 Antitripsin deficiency, Anxiety masquerading as Airways dz,  ABPA,  Allergy(esp in young), Aspiration (esp in elderly), Adverse effects of meds,  Active smoking or vaping, A bunch of PE's (a small clot burden can't cause this syndrome unless there is already severe underlying pulm or vascular dz with poor reserve) plus two Bs  = Bronchiectasis and Beta blocker use..and one C= CHF   Adherence is always the initial "prime suspect" and is a multilayered concern that requires a "trust but verify" approach in every patient - starting with knowing how to use medications, especially inhalers, correctly, keeping up with refills and understanding the fundamental difference between maintenance and prns vs those medications only taken for a very short course and then stopped and not refilled.  - see hfa teaching - return in 2 weeks with all meds in hand using a trust but verify approach to confirm accurate Medication  Reconciliation The principal here is that until we are certain that the  patients are doing what we've asked, it  makes no sense to ask them to do more.  ? Adverse effects of meds   > try off dpi/ biphosphonates for now   ? Acid (or non-acid) GERD > always difficult to exclude as up to 75% of pts in some series report no assoc GI/ Heartburn symptoms> rec max (24h)  acid suppression/ try off biphosphonates again  and diet restrictions/ reviewed and instructions given in writing  ? Allergy > doubt  But cover with Prednisone 10 mg take  4 each am x 2 days,   2 each am x 2 days,  1 each am x 2 days and stop   ? A bunch of PE's > w/u neg 01/27/19 with unimpressive d dimer, no further eval needed  ? Beta blocker effects > unlikely on low dose toprol  ? Chf/ cardiac asthma > cxr and absence of noct symptoms do no support this dx

## 2019-02-07 NOTE — Assessment & Plan Note (Addendum)
Onset around 2008 - Allergy profile 05/22/2016 >  Eos 0.3 /  IgE  10 neg rast - singulair / gerd rx started 05/22/2016 >>> still with pnds 06/19/2016 > add dymista / 1st gen H1> no better but never took h1 so rec stop dymista  - no better 08/07/2016 on singulair/ anoro > rec stop both and just use prn saba/ h1 > improved 09/26/2016  - restarted gerd rx and 1st gen H1 blockers per guidelines  12/03/2017  - rec d/c fosfamax x 6 weeks 01/14/2018 > resolved 02/25/2018 so ok to rechallenge with fosfamax 02/25/2018 and flared again at ov 04/30/2018 so rec substitute prolia or Reclast  - 02/06/2019 flared again on biphosphonates > d/c permanently, consider reclast or prolia   Upper airway cough syndrome (previously labeled PNDS),  is so named because it's frequently impossible to sort out how much is  CR/sinusitis with freq throat clearing (which can be related to primary GERD)   vs  causing  secondary (" extra esophageal")  GERD from wide swings in gastric pressure that occur with throat clearing, often  promoting self use of mint and menthol lozenges that reduce the lower esophageal sphincter tone and exacerbate the problem further in a cyclical fashion.   These are the same pts (now being labeled as having "irritable larynx syndrome" by some cough centers) who not infrequently have a history of having failed to tolerate ace inhibitors,  dry powder inhalers or biphosphonates (both of which may apply here) or report having atypical/extraesophageal reflux symptoms that don't respond to standard doses of PPI  and are easily confused as having aecopd or asthma flares by even experienced allergists/ pulmonologists (myself included).    Try max rx for gerd/ diet / off biphosphonates and f/u in 2 weeks to regroup     I had an extended discussion with the patient reviewing all relevant studies completed to date and  lasting 25 minutes of a 40  minute office  visit with husband addressing  severe non-specific but  potentially very serious refractory respiratory symptoms of uncertain and potentially multiple  Etiologies.   I directly observed portions of ambulatory 02 saturation study/  performed device teaching  using a teach back technique which also  extended face to face time for this visit (see above)   Each maintenance medication was reviewed in detail including most importantly the difference between maintenance and prns and under what circumstances the prns are to be triggered using an action plan format that is not reflected in the computer generated alphabetically organized AVS.    Please see AVS for specific instructions unique to this office visit that I personally wrote and verbalized to the the pt in detail and then reviewed with pt  by my nurse highlighting any changes in therapy/plan of care  recommended at today's visit.

## 2019-02-21 ENCOUNTER — Other Ambulatory Visit: Payer: Self-pay

## 2019-02-21 ENCOUNTER — Ambulatory Visit: Payer: Medicare Other | Admitting: Internal Medicine

## 2019-02-21 ENCOUNTER — Encounter: Payer: Self-pay | Admitting: Internal Medicine

## 2019-02-21 DIAGNOSIS — R0600 Dyspnea, unspecified: Secondary | ICD-10-CM

## 2019-02-21 DIAGNOSIS — J449 Chronic obstructive pulmonary disease, unspecified: Secondary | ICD-10-CM

## 2019-02-21 DIAGNOSIS — R05 Cough: Secondary | ICD-10-CM

## 2019-02-21 DIAGNOSIS — R0609 Other forms of dyspnea: Secondary | ICD-10-CM

## 2019-02-21 DIAGNOSIS — R058 Other specified cough: Secondary | ICD-10-CM

## 2019-02-21 LAB — CBC WITH DIFFERENTIAL/PLATELET
Basophils Absolute: 0.2 10*3/uL — ABNORMAL HIGH (ref 0.0–0.1)
Basophils Relative: 1.6 % (ref 0.0–3.0)
Eosinophils Absolute: 0.1 10*3/uL (ref 0.0–0.7)
Eosinophils Relative: 0.5 % (ref 0.0–5.0)
HCT: 35.2 % — ABNORMAL LOW (ref 36.0–46.0)
Hemoglobin: 11.5 g/dL — ABNORMAL LOW (ref 12.0–15.0)
Lymphocytes Relative: 9.4 % — ABNORMAL LOW (ref 12.0–46.0)
Lymphs Abs: 1.1 10*3/uL (ref 0.7–4.0)
MCHC: 32.7 g/dL (ref 30.0–36.0)
MCV: 93.7 fl (ref 78.0–100.0)
Monocytes Absolute: 1.3 10*3/uL — ABNORMAL HIGH (ref 0.1–1.0)
Monocytes Relative: 11.6 % (ref 3.0–12.0)
Neutro Abs: 8.8 10*3/uL — ABNORMAL HIGH (ref 1.4–7.7)
Neutrophils Relative %: 76.9 % (ref 43.0–77.0)
Platelets: 386 10*3/uL (ref 150.0–400.0)
RBC: 3.76 Mil/uL — ABNORMAL LOW (ref 3.87–5.11)
RDW: 15 % (ref 11.5–15.5)
WBC: 11.4 10*3/uL — ABNORMAL HIGH (ref 4.0–10.5)

## 2019-02-21 LAB — BASIC METABOLIC PANEL
BUN: 38 mg/dL — ABNORMAL HIGH (ref 6–23)
CO2: 27 mEq/L (ref 19–32)
Calcium: 9.7 mg/dL (ref 8.4–10.5)
Chloride: 101 mEq/L (ref 96–112)
Creatinine, Ser: 1.5 mg/dL — ABNORMAL HIGH (ref 0.40–1.20)
GFR: 33.36 mL/min — ABNORMAL LOW (ref >60.00)
Glucose, Bld: 109 mg/dL — ABNORMAL HIGH (ref 70–99)
Potassium: 5.3 mEq/L — ABNORMAL HIGH (ref 3.5–5.1)
Sodium: 135 mEq/L (ref 135–145)

## 2019-02-21 LAB — BRAIN NATRIURETIC PEPTIDE: Pro B Natriuretic peptide (BNP): 383 pg/mL — ABNORMAL HIGH (ref 0.0–100.0)

## 2019-02-21 MED ORDER — GABAPENTIN 100 MG PO CAPS: 100.0000 mg | ORAL_CAPSULE | Freq: Three times a day (TID) | ORAL | 2 refills | Status: DC

## 2019-02-21 NOTE — Patient Instructions (Addendum)
Stop symbicort to see what difference if any it makes  Gabapentin is 100 mg three times daily   Stop losartan and start metaprolol 25 mg twice daily   Please remember to go to the lab department   for your tests - we will call you with the results when they are available.      Please schedule a follow up office visit in 4 weeks, sooner if needed  - add echo prior to next ov

## 2019-02-21 NOTE — Progress Notes (Signed)
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 PULMONARY CONSULT    Pulmonary nodules. Pt reports some dyspnea with exertion, some morning dry to wet cough. Pt does not use any inhalers.   Cough better but some cc am congestion x years ? Worse in winter > total ? Usually swallows so can't say color or amt assoc with mild pnds  Doe only with exertion = MMRC1 = can walk nl pace, flat grade, can't hurry or go uphills or steps s mild sob  rec Please remember to go to the lab  department downstairs for your tests - we will call you with the results when they are available. 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 Follow-up    Coughing more and having more PND. Cough is non prod. She is not using her albuterol inhaler.   after stirs has  lots of nonproductive coughing assoc with sense of pnds  no better on anoro,  Not limited by breathing from desired activities   rec Stop anoro and montelukast to see what difference this makes For drainage / throat tickle try take CHLORPHENIRAMINE  4 mg - take one every 4 hours as needed -  available over the counter-   - late add:  pred x 6 days prn flare x one - consider adding gabapentin next ov    09/26/2016  f/u ov/[name] re: GOLD I copd [name] / no worse breathing or coughing off anoro/montelukast but maint on nasacort  Chief Complaint  Patient presents with  . Follow-up    Breathing is doing well and she is not coughing much. No new co's.  doe = MMRC1 = can walk nl pace, flat grade, can't hurry or go uphills or steps s sob   Only a little drainage p one chlorpheniramine in am and double strength at hs and sleep fine now rec Continue protonix and pepcid for another 2 months then fine to try to taper  off by stopping the protonix first and changing the pepcid 20 mg twice daily then taper the pepcid off as well and see if cough flares or not - if does flare, restart and you may need GI evaluation  For drainage / throat tickle try take CHLORPHENIRAMINE  4 mg - take one every 4 hours as needed - available over the counter- may cause drowsiness so start with just a bedtime dose or two and see how  you tolerate it before trying in daytime   If not satisfied we start you on gabapentin 100 mg three times daily     10/08/17  Excisional bx LUL c/w nec granuloma      12/03/2017 acute extended ov/[name] re: re-establish  sob gradually downhill x one year then much worse since June 2019 Chief Complaint  Patient presents with  . Acute Visit    Increased cough since June- prod but unsure sputum color. She states her breathing has been gradually worse since her last visit May 2019.   still using chlorpheniramine  And still sleeping fine but wakes up feeling very congested at usual hour and then all during the day has same sensation but not coughing up excess/purulent mucus Doe gradually worse x one year  = MMRC2 = can't walk a nl pace on a flat grade s sob but does fine slow and flat  Arthritis good control on arava  rec For drainage / throat tickle try take CHLORPHENIRAMINE  4 mg - take  one every 4 hours as needed - available over the counter- may cause drowsiness so start with just a bedtime dose or two and see how you tolerate it before trying in daytime   Pantoprazole (protonix) 40 mg   Take  30-60 min before first meal of the day and Pepcid (famotidine)  20 mg one @  bedtime until return to office - this is the best way to tell whether stomach acid is contributing to your problem.   GERD diet  Pl   Add if cough/sob worse > Prednisone 10 mg take  4 each am x 2 days,   2 each am x 2 days,  1 each am x 2 days and stop       01/14/2018  f/u ov/[name] re:  GOLD I copd with reversible component . Did not take the prednisone  Chief Complaint  Patient presents with  . Follow-up    PFT's done today. Her cough and SOB are about the same. No new co's.   Dyspnea:  MMRC1 = can walk nl pace, flat grade, can't hurry or go uphills or steps s sob   Cough: more of throat clearing/ daytime x years / p  rx fosfamax >>  worse since then  Sleeping: flat bed / one pillow SABA use: none 02: none  rec Start BREO 245 one click each am x 2 drags each am  Try off fosfamax for now  Please schedule a follow up office visit in 6 weeks, call sooner if needed     02/25/2018  f/u ov/[name] re: COPD GOLD 1/ maint breo and no fosfamax since last ov  Chief Complaint  Patient presents with  . Follow-up    Breathing is unchanged. She does not have a rescue inhaler.   Dyspnea:   MMRC1 = can walk nl pace, flat grade, can't hurry or go uphills or steps s sob   Cough: no longer  Coughing/ not  Much throat clearing  Sleeping: bed is flat/ one pillow rec Ok to start back on fosfamax with no other changes for now to see what difference it make but if upper airway symptoms are worse you will need alternative (Recast/ prolia)  Remember to brush your teeth and tongue with [employer] based toothpaste and if not better we need to see you back for a different inhaler (you will need to bring your drug formulary  with you )     04/30/2018  f/u ov/[name] re:  Copd GOLD I/ maint breo/ back on fosfamax > more throat clearing despite rx for gerd Chief Complaint  Patient presents with  . Follow-up    Breathing is unchanged. No new co's. She does not have a rescue inhaler.   Dyspnea:  Still mmrc1  Cough: min throat clearing worse on fosfamax  Sleeping: bed is flat/ one or two pillows SABA use: none  02: none rec Try off fosfamax now  to see if throat clearing is better and if it is you Prolia or Reclast as options If not better after 3 months, bring your formulary with you and we'll try you on a different inhaler- try off the breo for a week prior to the visit you can   02/06/2019  f/u ov/[name] re:  Gradually downhill back on biphosphonates /Breo with only GOLD I criteria previously  Chief Complaint  Patient presents with  . Follow-up    Breathing is no better since last visit. She is getting SOB with exertion such as just standing up. She rarely uses her albuterol inhaler, does not feel it helps. She has non prod cough.   Dyspnea:  MMRC3 = can't walk 100 yards even at a slow pace at a flat grade s stopping due to sob   Cough: worse in fall and winter but day > noct with sensation of globus Sleeping: flat / one pillow no resp symptoms noct  SABA use: rarely, not helping  02: none  rec Pantoprazole (protonix) 40 mg   Take  30-60 min before first meal of the day and Pepcid (famotidine)  20 mg one after supper  until return to office - this is the best way to tell whether stomach acid is contributing to your problem and stop the risedronate for now  GERD diet  Prednisone 10 mg take  4 each am x 2 days,   2 each am x 2 days,  1 each am x 2 days and stop  Stop breo and start symbicort 80 Take 2 puffs first thing in am and then another 2 puffs about 12 hours later      02/21/2019  f/u ov/[name] re: GOLD I copd, worsening sob  Chief Complaint  Patient presents with  . Follow-up    Breathing is  unchanged. She rarely uses her albuterol inhaler.   Dyspnea:  Room to room but also limited by L hip pain  Cough: all day sensation of globus but no mucus Sleeping: flat/ one pillow  SABA use: used once and no better  02: none   No obvious day to day or daytime variability or assoc excess/ purulent sputum or mucus plugs or hemoptysis or cp or chest tightness, subjective wheeze or overt sinus or hb symptoms.   Sleeping  without nocturnal  or early am exacerbation  of respiratory  c/o's or need for noct saba. Also denies any obvious fluctuation of symptoms with weather or environmental changes or other aggravating or alleviating factors except as outlined above   No unusual exposure hx or h/o childhood pna/ asthma or knowledge of premature birth.  Current Allergies, Complete Past Medical History, Past Surgical History, Family History, and Social History were reviewed in [employer] record.  ROS  The following are not active complaints unless bolded Hoarseness, sore throat= globus, dysphagia, dental problems, itching, sneezing,  nasal congestion or discharge of excess mucus or purulent secretions, ear ache,   fever, chills, sweats, unintended wt loss or wt gain, classically  pleuritic or exertional cp,  orthopnea pnd or arm/hand swelling  or leg swelling, presyncope, palpitations, abdominal pain, anorexia, nausea, vomiting, diarrhea  or change in bowel habits or change in bladder habits, change in stools or change in urine, dysuria, hematuria,  rash, arthralgias, visual complaints, headache, numbness, weakness or ataxia or problems with walking or coordination,  change in mood or  memory.        Current Meds  Medication Sig  . acetaminophen (TYLENOL) 500 MG tablet Take 500 mg by mouth every 6 (six) hours as needed (pain).   [name] albuterol (VENTOLIN HFA) 108 (90 Base) MCG/ACT inhaler Inhale 2 puffs into the lungs every 6 (six) hours as needed for wheezing or shortness of breath.   [name] atorvastatin (LIPITOR) 80 MG tablet Take 1 tablet (80 mg total) by mouth daily.  . budesonide-formoterol (SYMBICORT) 80-4.5 MCG/ACT inhaler Inhale 2 puffs into the lungs 2 (two) times daily.  . CHLORPHENIRAMINE-ACETAMINOPHEN PO Take 1 tablet by mouth 2 (two) times daily.  . Cholecalciferol (VITAMIN D3) 50 MCG (2000 UT) capsule Take 1 capsule (2,000 Units total) by mouth daily.  . clopidogrel (PLAVIX) 75 MG tablet Take 1 tablet (75 mg total) by mouth daily.  . famotidine (PEPCID) 20 MG tablet One at bedtime  . Investigational - Study Medication Take 3 capsules by mouth daily. Study name: Women's Health Initiative Study COSMOS  Additional study details: 1 cap is Calcium, 2 caps are Coco extract  . leflunomide (ARAVA) 20 MG tablet Take 20 mg by mouth every other day.   . levothyroxine (SYNTHROID) 88 MCG tablet TAKE 1 TABLET BY MOUTH  DAILY BEFORE BREAKFAST.  KEEP SCHEDULED APPT FOR  FUTURE REFILLS  . loperamide (IMODIUM A-D) 2 MG tablet Take 2 mg by mouth 4 (four) times daily as needed for diarrhea or loose stools.  [name] losartan (COZAAR) 25 MG tablet Take 1 tablet (25 mg total) by mouth daily.  . metoprolol succinate (TOPROL-XL) 25 MG 24 hr tablet Take 1 tablet (25 mg total) by mouth daily.  . pantoprazole (PROTONIX) 40 MG tablet Take 1 tablet (40 mg total) by mouth daily. Take 30-60 min before first meal of the day  . psyllium (METAMUCIL) 58.6 % packet Take 1 packet by mouth daily.  [name] triamcinolone (NASACORT ALLERGY 24HR) 55 MCG/ACT AERO nasal inhaler Place 1 spray into the nose daily.                    Objective:   Physical Exam   stoic amb wf nad   02/21/2019       148 02/06/2019         148 04/30/2018      148 01/14/2018        144  12/03/2017          147  09/26/2016        141  08/07/2016          146  06/19/2016        152  05/22/2016        148   02/15/15 155 lb 6.4 oz (70.489 kg)  01/29/15 154 lb (69.854 kg)  11/26/14 153 lb 8 oz (69.627 kg)     Vital signs reviewed - Note  on arrival 02 sats  96% on RA    HEENT : pt wearing mask not removed for exam due to covid - 19 concerns.   NECK :  without JVD/Nodes/TM/ nl carotid upstrokes bilaterally   LUNGS: no acc muscle  use,  Min barrel  contour chest wall with bilateral  slightly decreased bs s audible wheeze and  without cough on insp or exp maneuvers and min  Hyperresonant  to  percussion bilaterally     CV:  RRR  no s3 or murmur or increase in P2, and no edema   ABD:  soft and nontender with pos end  insp Hoover's  in the supine position. No bruits or organomegaly appreciated, bowel sounds nl  MS:   Nl gait/  ext warm without deformities, calf tenderness, cyanosis or clubbing No obvious joint restrictions   SKIN: warm and dry without lesions    NEURO:  alert, approp, nl sensorium with  no motor or cerebellar deficits apparent.               I personally reviewed images and agree with radiology impression as follows:  CXR:   01/27/19   COPD changes with minimal bibasilar atelectasis or scarring. No acute abnormalities.  Vq s 01/27/19   Very low prob     Labs ordered/ reviewed:      Chemistry      Component Value Date/Time   NA 135 02/21/2019 1001   K 5.3 No hemolysis seen (H) 02/21/2019 1001   CL 101 02/21/2019 1001   CO2 27 02/21/2019 1001   BUN 38 (H) 02/21/2019 1001   CREATININE 1.50 (H) 02/21/2019 1001   CREATININE 1.51 (H) 04/06/2014 1543      Component Value Date/Time   CALCIUM 9.7 02/21/2019 1001   ALKPHOS 104 11/13/2018 1125   AST 23 11/13/2018 1125   ALT 28 11/13/2018 1125   BILITOT 0.7 11/13/2018 1125        Lab Results  Component Value Date   WBC 11.4 (H) 02/21/2019   HGB 11.5 (L) 02/21/2019   HCT 35.2 (L) 02/21/2019   MCV 93.7 02/21/2019   PLT 386.0 02/21/2019     Lab Results  Component Value Date   DDIMER 0.61 (H) 01/22/2019      Lab Results  Component Value Date   TSH 2.57 11/13/2018     Lab Results  Component Value Date   PROBNP 383.0 (H)  02/21/2019                Assessment & Plan:

## 2019-02-24 ENCOUNTER — Encounter: Payer: Self-pay | Admitting: Internal Medicine

## 2019-02-24 ENCOUNTER — Telehealth: Payer: Self-pay | Admitting: Internal Medicine

## 2019-02-24 DIAGNOSIS — R0609 Other forms of dyspnea: Secondary | ICD-10-CM

## 2019-02-24 NOTE — Assessment & Plan Note (Addendum)
Onset around 2008 - Allergy profile 05/22/2016 >  Eos 0.3 /  IgE  10 neg rast - singulair / gerd rx started 05/22/2016 >>> still with pnds 06/19/2016 > add dymista / 1st gen H1> no better but never took h1 so rec stop dymista  - no better 08/07/2016 on singulair/ anoro > rec stop both and just use prn saba/ h1 > improved 09/26/2016  - restarted gerd rx and 1st gen H1 blockers per guidelines  12/03/2017  - rec d/c fosfamax x 6 weeks 01/14/2018 > resolved 02/25/2018 so ok to rechallenge with fosfamax 02/25/2018 and flared again at ov 04/30/2018 so rec substitute prolia or Reclast  - 02/06/2019 flared again on biphosphonates > d/c permanently, consider reclast or prolia  - 02/21/2019 added gabapentin 100 tid for globus and stop losartan   Upper airway cough syndrome (previously labeled PNDS),  is so named because it's frequently impossible to sort out how much is  CR/sinusitis with freq throat clearing (which can be related to primary GERD)   vs  causing  secondary (" extra esophageal")  GERD from wide swings in gastric pressure that occur with throat clearing, often  promoting self use of mint and menthol lozenges that reduce the lower esophageal sphincter tone and exacerbate the problem further in a cyclical fashion.   These are the same pts (now being labeled as having "irritable larynx syndrome" by some cough centers) who not infrequently have a history of having failed to tolerate ace inhibitors (and sometimes generic losartan),  dry powder inhalers or biphosphonates or report having atypical/extraesophageal reflux symptoms that don't respond to standard doses of PPI  and are easily confused as having aecopd or asthma flares by even experienced allergists/ pulmonologists (myself included).    >>> next try gabapentin and off losartan

## 2019-02-24 NOTE — Assessment & Plan Note (Signed)
Quit smoking in her 17s - PFT's  02/19/15   FEV1 1.78 (90 % ) ratio 66  p 12 % improvement from saba p nothing  prior to study with DLCO  71 % corrects to 78 % for alv volume  And classical curvature  - Spirometry 06/19/2016  FEV1 1.65  (84%)  Ratio 69  With no prior rx  - PFT's  01/14/2018  FEV1 1.68 (90 % ) ratio 68  p 18 % improvement from saba p nothing  prior to study with DLCO  84 % corrects to 90  % for alv volume  - 01/14/2018    try BREO 100 each am > worse 02/06/2019 so d/c'd - 02/06/2019  After extensive coaching inhaler device,  effectiveness =   75% with hfa > change to symb 80 2bid and add spacer next ov if uacs component not improved  - 02/06/2019   Walked RA x one lap =  approx 250 ft - stopped due to  Sob with sats 95% avg pace  - 02/21/2019   Walked RA  2 laps @  approx 271ft each @ slow pace  stopped due to tired more than sob / sats 90%    Try off symbicort as doesn't have much airflow obstruction and the ics may be contributing to UACS   I had an extended discussion with the patient reviewing all relevant studies completed to date and  lasting 15 to 20 minutes of a 25 minute visit  which included directly observing ambulatory 02 saturation study documented in a/p section of  today's  office note.  Each maintenance medication was reviewed in detail including most importantly the difference between maintenance and prns and under what circumstances the prns are to be triggered using an action plan format that is not reflected in the computer generated alphabetically organized AVS.     Please see AVS for specific instructions unique to this visit that I personally wrote and verbalized to the the pt in detail and then reviewed with pt  by my nurse highlighting any changes in therapy recommended at today's visit .

## 2019-02-24 NOTE — Telephone Encounter (Signed)
Called and spoke to patient. Relayed results per Dr. Melvyn Novas as below.  Patient verbalized understanding and reports she sees Dr. Ellyn Hack as her cardiologist.  Let patient know we would go ahead and place the order for the echo. Nothing further needed at this time.      Notes recorded by Tanda Rockers, MD on 02/24/2019 at 6:31 AM EDT  Call patient : Studies are unremarkable x bnp trending up so rec echo prior to next ov to see if pressures in heart are contributing to doe

## 2019-02-24 NOTE — Assessment & Plan Note (Addendum)
Spirometry 02/15/2015   FEV1  1.84 (92%) ratio 77 - 02/15/2015  Walked RA x 3 laps @ 185 ft each stopped due to  End of study, fast  pace, no sob or desat   - 12/03/2017  Walked RA x 3 laps @ 185 ft each stopped due to  End of study, fast pace, no  desat  / min sob   - 01/27/2019   vq very low prob  She may have component of diastolic dysfunction or PH based on   bnp > check echo next

## 2019-02-24 NOTE — Progress Notes (Signed)
LMTCB

## 2019-02-27 ENCOUNTER — Encounter: Payer: Self-pay | Admitting: Gastroenterology

## 2019-03-10 ENCOUNTER — Other Ambulatory Visit: Payer: Self-pay

## 2019-03-10 ENCOUNTER — Ambulatory Visit (HOSPITAL_COMMUNITY): Payer: Medicare Other | Attending: Internal Medicine

## 2019-03-10 DIAGNOSIS — R0609 Other forms of dyspnea: Secondary | ICD-10-CM

## 2019-03-10 DIAGNOSIS — R0600 Dyspnea, unspecified: Secondary | ICD-10-CM

## 2019-03-11 ENCOUNTER — Telehealth: Payer: Self-pay | Admitting: Internal Medicine

## 2019-03-11 NOTE — Progress Notes (Signed)
LMTCB

## 2019-03-11 NOTE — Telephone Encounter (Signed)
Call returned to patient, confirmed DOB, returning call about her echo results:  Notes recorded by Tanda Rockers, MD on 03/10/2019 at 4:49 PM EST  Call patient : Study is unremarkable except for trhe effects of hbp> no change rx/ Be sure patient keeps f/u ov so we can go over all the details of this study and get a plan together moving forward - ok to move up f/u if not feeling better and wants to be seen sooner.   Made aware of results. Voiced understanding. Okay to keep appt as is.   Nothing further is needed at this time.

## 2019-03-20 ENCOUNTER — Other Ambulatory Visit: Payer: Self-pay

## 2019-03-20 ENCOUNTER — Encounter: Payer: Self-pay | Admitting: Internal Medicine

## 2019-03-20 ENCOUNTER — Ambulatory Visit: Payer: Medicare Other | Admitting: Internal Medicine

## 2019-03-20 ENCOUNTER — Telehealth: Payer: Self-pay | Admitting: Internal Medicine

## 2019-03-20 DIAGNOSIS — R058 Other specified cough: Secondary | ICD-10-CM

## 2019-03-20 DIAGNOSIS — R0600 Dyspnea, unspecified: Secondary | ICD-10-CM | POA: Diagnosis not present

## 2019-03-20 DIAGNOSIS — I10 Essential (primary) hypertension: Secondary | ICD-10-CM | POA: Diagnosis not present

## 2019-03-20 DIAGNOSIS — J449 Chronic obstructive pulmonary disease, unspecified: Secondary | ICD-10-CM | POA: Diagnosis not present

## 2019-03-20 DIAGNOSIS — R0609 Other forms of dyspnea: Secondary | ICD-10-CM

## 2019-03-20 DIAGNOSIS — R05 Cough: Secondary | ICD-10-CM

## 2019-03-20 MED ORDER — METOPROLOL TARTRATE 50 MG PO TABS: 50.0000 mg | ORAL_TABLET | Freq: Two times a day (BID) | ORAL | 2 refills | Status: DC

## 2019-03-20 MED ORDER — GABAPENTIN 100 MG PO CAPS: 100.0000 mg | ORAL_CAPSULE | Freq: Three times a day (TID) | ORAL | 2 refills | Status: DC

## 2019-03-20 NOTE — Telephone Encounter (Signed)
[facility] this morning by Dr Melvyn Novas and receipt confirmed by pharm    Disp Refills Start End   gabapentin (NEURONTIN) 100 MG capsule 270 capsule 2 03/20/2019    Sig - Route: Take 1 capsule (100 mg total) by mouth 3 (three) times daily. One three times daily - Oral   Sent to pharmacy as: gabapentin (NEURONTIN) 100 MG capsule   E-Prescribing Status: Receipt confirmed by pharmacy (03/20/2019 9:53 AM EST)    Spoke with pt and notified of this  Nothing further needed

## 2019-03-20 NOTE — Progress Notes (Signed)
Subjective:   Patient ID: [name], female    DOB: Oct 24, 1940     MRN: [id number]     Brief patient profile:  78  yowf  Only smoked age 78 - 78  s obvious sequelae with new cough while on a cruise in September 2016 in [place] > cxr > ct chest > PET > referred to pulmonary clinic 02/15/2015 by Dr  [name] UC with GOLD I copd 02/19/15     History of Present Illness  02/15/2015 1st [facility] office visit/ [name]   Chief Complaint  Patient presents with   PULMONARY CONSULT    Pulmonary nodules. Pt reports some dyspnea with exertion, some morning dry to wet cough. Pt does not use any inhalers.   Cough better but some cc am congestion x years ? Worse in winter > total ? Usually swallows so can't say color or amt assoc with mild pnds  Doe only with exertion = MMRC1 = can walk nl pace, flat grade, can't hurry or go uphills or steps s mild sob  rec Please remember to go to the lab  department downstairs for your tests - we will call you with the results when they are available. 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 Cough is non prod. She is not using her albuterol inhaler.   after stirs has  lots of nonproductive coughing assoc with sense of pnds  no better on anoro,  Not limited by breathing from desired activities   rec Stop anoro and montelukast to see what difference this makes For drainage / throat tickle try take CHLORPHENIRAMINE  4 mg - take one every 4 hours as needed -  available over the counter-   - late add:  pred x 6 days prn flare x one - consider adding gabapentin next ov    09/26/2016  f/u ov/[name] re: GOLD I copd [name] / no worse breathing or coughing off anoro/montelukast but maint on nasacort  Chief Complaint  Patient presents with   Follow-up    Breathing is doing well and she is not coughing much. No new co's.  doe = MMRC1 = can walk nl pace, flat grade, can't hurry or go uphills or steps s sob   Only a little drainage p one chlorpheniramine in am and double strength at hs and sleep fine now rec Continue protonix and pepcid for another 2 months then fine to try to taper  off by stopping the protonix first and changing the pepcid 20 mg twice daily then taper the pepcid off as well and see if cough flares or not - if does flare, restart and you may need GI evaluation  For drainage / throat tickle try take CHLORPHENIRAMINE  4 mg - take one every 4 hours as needed - available over the counter- may cause drowsiness so start with just a bedtime dose or two and see how  you tolerate it before trying in daytime   If not satisfied we start you on gabapentin 100 mg three times daily     10/08/17  Excisional bx LUL c/w nec granuloma      12/03/2017 acute extended ov/[name] re: re-establish  sob gradually downhill x one year then much worse since June 2019 Chief Complaint  Patient presents with   Acute Visit    Increased cough since June- prod but unsure sputum color. She states her breathing has been gradually worse since her last visit May 2019.   still using chlorpheniramine  And still sleeping fine but wakes up feeling very congested at usual hour and then all during the day has same sensation but not coughing up excess/purulent mucus Doe gradually worse x one year  = MMRC2 = can't walk a nl pace on a flat grade s sob but does fine slow and flat  Arthritis good control on arava  rec For drainage / throat tickle try take CHLORPHENIRAMINE  4 mg - take  one every 4 hours as needed - available over the counter- may cause drowsiness so start with just a bedtime dose or two and see how you tolerate it before trying in daytime   Pantoprazole (protonix) 40 mg   Take  30-60 min before first meal of the day and Pepcid (famotidine)  20 mg one @  bedtime until return to office - this is the best way to tell whether stomach acid is contributing to your problem.   GERD diet  Pl   Add if cough/sob worse > Prednisone 10 mg take  4 each am x 2 days,   2 each am x 2 days,  1 each am x 2 days and stop       01/14/2018  f/u ov/[name] re:  GOLD I copd with reversible component . Did not take the prednisone  Chief Complaint  Patient presents with   Follow-up    PFT's done today. Her cough and SOB are about the same. No new co's.   Dyspnea:  MMRC1 = can walk nl pace, flat grade, can't hurry or go uphills or steps s sob   Cough: more of throat clearing/ daytime x years / p  rx fosfamax >>  worse since then  Sleeping: flat bed / one pillow SABA use: none 02: none  rec Start BREO 921 one click each am x 2 drags each am  Try off fosfamax for now  Please schedule a follow up office visit in 6 weeks, call sooner if needed     02/25/2018  f/u ov/[name] re: COPD GOLD 1/ maint breo and no fosfamax since last ov  Chief Complaint  Patient presents with   Follow-up    Breathing is unchanged. She does not have a rescue inhaler.   Dyspnea:   MMRC1 = can walk nl pace, flat grade, can't hurry or go uphills or steps s sob   Cough: no longer  Coughing/ not  Much throat clearing  Sleeping: bed is flat/ one pillow rec Ok to start back on fosfamax with no other changes for now to see what difference it make but if upper airway symptoms are worse you will need alternative (Recast/ prolia)  Remember to brush your teeth and tongue with [employer] based toothpaste and if not better we need to see you back for a different inhaler (you will need to bring your drug formulary  with you )     04/30/2018  f/u ov/[name] re:  Copd GOLD I/ maint breo/ back on fosfamax > more throat clearing despite rx for gerd Chief Complaint  Patient presents with   Follow-up    Breathing is unchanged. No new co's. She does not have a rescue inhaler.   Dyspnea:  Still mmrc1  Cough: min throat clearing worse on fosfamax  Sleeping: bed is flat/ one or two pillows SABA use: none  02: none rec Try off fosfamax now  to see if throat clearing is better and if it is you Prolia or Reclast as options If not better after 3 months, bring your formulary with you and we'll try you on a different inhaler- try off the breo for a week prior to the visit you can   02/06/2019  f/u ov/Starkisha Tullis re:  Gradually downhill back on biphosphonates /Breo with only GOLD I criteria previously  Chief Complaint  Patient presents with   Follow-up    Breathing is no better since last visit. She is getting SOB with exertion such as just standing up. She rarely uses her albuterol inhaler, does not feel it helps. She has non prod cough.   Dyspnea:  MMRC3 = can't walk 100 yards even at a slow pace at a flat grade s stopping due to sob   Cough: worse in fall and winter but day > noct with sensation of globus Sleeping: flat / one pillow no resp symptoms noct  SABA use: rarely, not helping  02: none  rec Pantoprazole (protonix) 40 mg   Take  30-60 min before first meal of the day and Pepcid (famotidine)  20 mg one after supper  until return to office - this is the best way to tell whether stomach acid is contributing to your problem and stop the risedronate for now  GERD diet  Prednisone 10 mg take  4 each am x 2 days,   2 each am x 2 days,  1 each am x 2 days and stop  Stop breo and start symbicort 80 Take 2 puffs first thing in am and then another 2 puffs about 12 hours later      02/21/2019  f/u ov/Rocklyn Mayberry re: GOLD I copd, worsening sob  Chief Complaint  Patient presents with   Follow-up    Breathing is  unchanged. She rarely uses her albuterol inhaler.   Dyspnea:  Room to room but also limited by L hip pain  Cough: all day sensation of globus but no mucus x years  Sleeping: flat/ one pillow  SABA use: used once and no better  02: none rec Stop symbicort to see what difference if any it makes Gabapentin is 100 mg three times daily  Stop losartan and start metaprolol 25 mg twice daily          03/20/2019  f/u ov/Bebe Moncure CZ:YSAY I copd/  cough improved p last ov  X 2 weeks then pred started and ex tol improved also Chief Complaint  Patient presents with   Follow-up    Breathing has improved since the last visit. She is on pred taper now per Dr Lenna Gilford.   Dyspnea:  Much better walking around friends homes x 10 min s stopping slow pace Cough: globus better but not gone  Sleeping: fine resp wise SABA use: none  02: none     No obvious day to day or daytime variability or assoc excess/ purulent sputum or mucus plugs or hemoptysis or cp or chest tightness, subjective wheeze or overt sinus or  hb symptoms.   Sleeps  without nocturnal  or early am exacerbation  of respiratory  c/o's or need for noct saba. Also denies any obvious fluctuation of symptoms with weather or environmental changes or other aggravating or alleviating factors except as outlined above   No unusual exposure hx or h/o childhood pna/ asthma or knowledge of premature birth.  Current Allergies, Complete Past Medical History, Past Surgical History, Family History, and Social History were reviewed in [employer] record.  ROS  The following are not active complaints unless bolded Hoarseness, sore throat (globus sensation 50% better) , dysphagia, dental problems, itching, sneezing,  nasal congestion or discharge of excess mucus or purulent secretions, ear ache,   fever, chills, sweats, unintended wt loss or wt gain, classically pleuritic or exertional cp,  orthopnea pnd or arm/hand swelling  or leg  swelling, presyncope, palpitations, abdominal pain, anorexia, nausea, vomiting, diarrhea  or change in bowel habits or change in bladder habits, change in stools or change in urine, dysuria, hematuria,  rash, arthralgias (improved)  visual complaints, headache, numbness, weakness or ataxia or problems with walking or coordination,  change in mood or  memory.        Current Meds  Medication Sig   acetaminophen (TYLENOL) 500 MG tablet Take 500 mg by mouth every 6 (six) hours as needed (pain).    albuterol (VENTOLIN HFA) 108 (90 Base) MCG/ACT inhaler Inhale 2 puffs into the lungs every 6 (six) hours as needed for wheezing or shortness of breath.   atorvastatin (LIPITOR) 80 MG tablet Take 1 tablet (80 mg total) by mouth daily.   CHLORPHENIRAMINE-ACETAMINOPHEN PO Take 1 tablet by mouth 2 (two) times daily.   Cholecalciferol (VITAMIN D3) 50 MCG (2000 UT) capsule Take 1 capsule (2,000 Units total) by mouth daily.   clopidogrel (PLAVIX) 75 MG tablet Take 1 tablet (75 mg total) by mouth daily.   famotidine (PEPCID) 20 MG tablet One at bedtime   gabapentin (NEURONTIN) 100 MG capsule Take 1 capsule (100 mg total) by mouth 3 (three) times daily. One three times daily   Investigational - Study Medication Take 3 capsules by mouth daily. Study name: Women's Health Initiative Study COSMOS  Additional study details: 1 cap is Calcium, 2 caps are Coco extract   leflunomide (ARAVA) 20 MG tablet Take 20 mg by mouth daily.    levothyroxine (SYNTHROID) 88 MCG tablet TAKE 1 TABLET BY MOUTH  DAILY BEFORE BREAKFAST.  KEEP SCHEDULED APPT FOR  FUTURE REFILLS   loperamide (IMODIUM A-D) 2 MG tablet Take 2 mg by mouth 4 (four) times daily as needed for diarrhea or loose stools.   metoprolol succinate (TOPROL-XL) 25 MG 24 hr tablet Take 50 mg by mouth daily.   pantoprazole (PROTONIX) 40 MG tablet Take 1 tablet (40 mg total) by mouth daily. Take 30-60 min before first meal of the day   predniSONE (STERAPRED  UNI-PAK 48 TAB) 5 MG (48) TBPK tablet As directed x 12 days per Dr Lenna Gilford   psyllium (METAMUCIL) 58.6 % packet Take 1 packet by mouth daily.   triamcinolone (NASACORT ALLERGY 24HR) 55 MCG/ACT AERO nasal inhaler Place 1 spray into the nose daily.                      Objective:   Physical Exam  amb wf nad   03/20/2019       151  02/21/2019       148 02/06/2019  148 04/30/2018      148 01/14/2018        144  12/03/2017          147  09/26/2016        141  08/07/2016          146  06/19/2016        152  05/22/2016        148   02/15/15 155 lb 6.4 oz (70.489 kg)  01/29/15 154 lb (69.854 kg)  11/26/14 153 lb 8 oz (69.627 kg)     BP (!) 194/90 (BP Location: Left Arm, Cuff Size: Normal)    Pulse 72    Temp (!) 97.4 F (36.3 C) (Temporal)    Ht 5\' 3"  (1.6 m)    Wt 151 lb 6.4 oz (68.7 kg)    SpO2 96% Comment: on RA   BMI 26.82 kg/m  HEENT : pt wearing mask not removed for exam due to covid - 19 concerns.   NECK :  without JVD/Nodes/TM/ nl carotid upstrokes bilaterally   LUNGS: no acc muscle use,  Min barrel  contour chest wall with bilateral  slightly decreased bs s audible wheeze and  without cough on insp or exp maneuvers and min  Hyperresonant  to  percussion bilaterally     CV:  RRR  no s3 or murmur or increase in P2, and no edema   ABD:  soft and nontender with pos end  insp Hoover's  in the supine position. No bruits or organomegaly appreciated, bowel sounds nl  MS:   Nl gait/  ext warm without deformities, calf tenderness, cyanosis or clubbing No obvious joint restrictions   SKIN: warm and dry without lesions    NEURO:  alert, approp, nl sensorium with  no motor or cerebellar deficits apparent.                         Assessment & Plan:

## 2019-03-20 NOTE — Patient Instructions (Signed)
Increase Lopressor 50 mg twice daily   Gabapentin 100 mg three times a day - call if throat discomfort is not improving to adjust up to maximum of 300 mg three times a day   Please schedule a follow up visit in 6  months but call sooner if needed

## 2019-03-21 ENCOUNTER — Encounter: Payer: Self-pay | Admitting: Internal Medicine

## 2019-03-21 NOTE — Assessment & Plan Note (Signed)
Onset around 2008 - Allergy profile 05/22/2016 >  Eos 0.3 /  IgE  10 neg rast - singulair / gerd rx started 05/22/2016 >>> still with pnds 06/19/2016 > add dymista / 1st gen H1> no better but never took h1 so rec stop dymista  - no better 08/07/2016 on singulair/ anoro > rec stop both and just use prn saba/ h1 > improved 09/26/2016  - restarted gerd rx and 1st gen H1 blockers per guidelines  12/03/2017  - rec d/c fosfamax x 6 weeks 01/14/2018 > resolved 02/25/2018 so ok to rechallenge with fosfamax 02/25/2018 and flared again at ov 04/30/2018 so rec substitute prolia or Reclast  - 02/06/2019 flared again on biphosphonates > d/c permanently, consider reclast or prolia   - 02/21/2019 added gabapentin 100 tid for globus and stop losartan >>>>  50% better 03/20/2019 so rec titrate up to 300 mg tid if tolerates  Discussed in detail all the  indications, usual  risks and alternatives  relative to the benefits with patient who agrees to proceed with rx as outlined.

## 2019-03-21 NOTE — Assessment & Plan Note (Signed)
Quit smoking in her 74s - PFT's  02/19/15   FEV1 1.78 (90 % ) ratio 66  p 12 % improvement from saba p nothing  prior to study with DLCO  71 % corrects to 78 % for alv volume  And classical curvature  - Spirometry 06/19/2016  FEV1 1.65  (84%)  Ratio 69  With no prior rx  - PFT's  01/14/2018  FEV1 1.68 (90 % ) ratio 68  p 18 % improvement from saba p nothing  prior to study with DLCO  84 % corrects to 90  % for alv volume  - 01/14/2018    try BREO 100 each am > worse 02/06/2019 so d/c'd - 02/06/2019  After extensive coaching inhaler device,  effectiveness =   75% with hfa > change to symb 80 2bid and add spacer next ov if uacs component not improved  - 02/06/2019   Walked RA x one lap =  approx 250 ft - stopped due to  Sob with sats 95% avg pace  - 02/21/2019   Walked RA  2 laps @  approx 226ft each @ slow pace  stopped due to tired more than sob / sats 90%    No worse off symbicort which may have contributed to uacs so leave off but be aware on prednisone for RA now which may hide the absence of symbicort - we'll see how she does as tapers and in meantime ok to use prn saba short term

## 2019-03-21 NOTE — Assessment & Plan Note (Signed)
Spirometry 02/15/2015   FEV1  1.84 (92%) ratio 77 - 02/15/2015  Walked RA x 3 laps @ 185 ft each stopped due to  End of study, fast  pace, no sob or desat   - 12/03/2017  Walked RA x 3 laps @ 185 ft each stopped due to  End of study, fast pace, no  desat  / min sob   - 01/27/2019   vq very low prob  Improved on prednisone which has also helped joints but may have helped airways in absence of symbicort, will see what happens as taper prednisone/ advised  I had an extended discussion with the patient reviewing all relevant studies completed to date and  lasting 15 to 20 minutes of a 25 minute visit    Each maintenance medication was reviewed in detail including most importantly the difference between maintenance and prns and under what circumstances the prns are to be triggered using an action plan format that is not reflected in the computer generated alphabetically organized AVS.     Please see AVS for specific instructions unique to this visit that I personally wrote and verbalized to the the pt in detail and then reviewed with pt  by my nurse highlighting any  changes in therapy recommended at today's visit to their plan of care.

## 2019-03-21 NOTE — Assessment & Plan Note (Addendum)
Losartan d/c 02/21/2019 due to concern for cough   - Echo 03/10/2019   1. Left ventricular ejection fraction, by visual estimation, is 60 to 65%. The left ventricle has normal function. There is severely increased left ventricular hypertrophy.  2. Left ventricular diastolic parameters are consistent with Grade I diastolic dysfunction (impaired relaxation).  3. Global right ventricle has normal systolic function.The right ventricular size is normal. No increase in right ventricular wall thickness.  4. Left atrial size was normal.  5. Right atrial size was normal.  6. The mitral valve is normal in structure. Mild to moderate mitral valve regurgitation.  7. The tricuspid valve is normal in structure. Tricuspid valve regurgitation is trivial.  8. The aortic valve is tricuspid. Aortic valve regurgitation is not visualized. Mild aortic valve sclerosis without stenosis.  9. The pulmonic valve was normal in structure. Pulmonic valve regurgitation is not visualized. 10. Mildly elevated pulmonary artery systolic pressure.  Reviewed echo results which are c/w mild diastolic dysfunction from HBP best rx = BB   Some better off losartan but bp not adequately controlled so rec increase toprol to 50 mg bid but if worse wheeze or bradycardia may need alternative rx per PCP ? Add hctz to offset effects of systemic steroids on bp/ calcium/ fluid balance?   Note: For reasons that may related to vascular permability and nitric oxide pathways but not elevated  bradykinin levels (as seen with  ACEi use) losartan in the generic form has been reported now from mulitple sources  to cause a similar pattern of non-specific  upper airway symptoms as seen with acei.   This has not been reported with exposure to the other ARB's to date, so it seems reasonable for now to try either generic diovan or avapro if ARB needed or use an alternative class altogether.  See:  Lelon Frohlich Allergy Asthma Immunol  2008: 101: p [phone]     Discussed  in detail all the  indications, usual  risks and alternatives  relative to the benefits with patient who agrees to proceed with rx as outlined.

## 2019-04-08 ENCOUNTER — Telehealth: Payer: Self-pay | Admitting: Internal Medicine

## 2019-04-08 NOTE — Telephone Encounter (Signed)
Pt's lab results faxed to [facility]. Fax confirmation received. Nothing further needed.

## 2019-05-08 ENCOUNTER — Other Ambulatory Visit: Payer: Self-pay | Admitting: Internal Medicine

## 2019-05-08 MED ORDER — GABAPENTIN 100 MG PO CAPS: 100.0000 mg | ORAL_CAPSULE | Freq: Three times a day (TID) | ORAL | 2 refills | Status: DC

## 2019-05-13 DIAGNOSIS — M2550 Pain in unspecified joint: Secondary | ICD-10-CM | POA: Diagnosis not present

## 2019-05-13 DIAGNOSIS — M150 Primary generalized (osteo)arthritis: Secondary | ICD-10-CM | POA: Diagnosis not present

## 2019-05-13 DIAGNOSIS — M0579 Rheumatoid arthritis with rheumatoid factor of multiple sites without organ or systems involvement: Secondary | ICD-10-CM | POA: Diagnosis not present

## 2019-05-13 DIAGNOSIS — Z79899 Other long term (current) drug therapy: Secondary | ICD-10-CM | POA: Diagnosis not present

## 2019-05-15 ENCOUNTER — Other Ambulatory Visit: Payer: Self-pay

## 2019-05-15 ENCOUNTER — Encounter: Payer: Self-pay | Admitting: Internal Medicine

## 2019-05-15 ENCOUNTER — Ambulatory Visit (INDEPENDENT_AMBULATORY_CARE_PROVIDER_SITE_OTHER): Payer: Medicare PPO | Admitting: Internal Medicine

## 2019-05-15 DIAGNOSIS — R05 Cough: Secondary | ICD-10-CM | POA: Diagnosis not present

## 2019-05-15 DIAGNOSIS — Z951 Presence of aortocoronary bypass graft: Secondary | ICD-10-CM | POA: Diagnosis not present

## 2019-05-15 DIAGNOSIS — R058 Other specified cough: Secondary | ICD-10-CM

## 2019-05-15 DIAGNOSIS — E875 Hyperkalemia: Secondary | ICD-10-CM | POA: Diagnosis not present

## 2019-05-15 DIAGNOSIS — M069 Rheumatoid arthritis, unspecified: Secondary | ICD-10-CM | POA: Diagnosis not present

## 2019-05-15 DIAGNOSIS — N183 Chronic kidney disease, stage 3 unspecified: Secondary | ICD-10-CM

## 2019-05-15 DIAGNOSIS — I10 Essential (primary) hypertension: Secondary | ICD-10-CM | POA: Diagnosis not present

## 2019-05-15 MED ORDER — PANTOPRAZOLE SODIUM 40 MG PO TBEC: 40.0000 mg | DELAYED_RELEASE_TABLET | Freq: Every day | ORAL | 3 refills | Status: DC

## 2019-05-15 MED ORDER — LEVOTHYROXINE SODIUM 88 MCG PO TABS: 88.0000 ug | ORAL_TABLET | Freq: Every day | ORAL | 3 refills | Status: DC

## 2019-05-15 MED ORDER — METOPROLOL TARTRATE 50 MG PO TABS: 50.0000 mg | ORAL_TABLET | Freq: Two times a day (BID) | ORAL | 3 refills | Status: DC

## 2019-05-15 MED ORDER — FAMOTIDINE 20 MG PO TABS: ORAL_TABLET | ORAL | 2 refills | Status: DC

## 2019-05-15 MED ORDER — CLOPIDOGREL BISULFATE 75 MG PO TABS: 75.0000 mg | ORAL_TABLET | Freq: Every day | ORAL | 3 refills | Status: DC

## 2019-05-15 MED ORDER — LOSARTAN POTASSIUM 100 MG PO TABS: 100.0000 mg | ORAL_TABLET | Freq: Every day | ORAL | 3 refills | Status: DC

## 2019-05-15 NOTE — Assessment & Plan Note (Signed)
Worse Re-start Losartan. Watch BMET

## 2019-05-15 NOTE — Assessment & Plan Note (Signed)
[name]

## 2019-05-15 NOTE — Assessment & Plan Note (Signed)
  Plavix, Losartan, Metoprolol

## 2019-05-15 NOTE — Progress Notes (Signed)
Subjective:  Patient ID: [name], female    DOB: 12-Oct-1940  Age: 79 y.o. MRN: [id number]  CC: No chief complaint on file.   HPI [name] presents for RA, CAD, GERD f/u  Outpatient Medications Prior to Visit  Medication Sig Dispense Refill  . acetaminophen (TYLENOL) 500 MG tablet Take 500 mg by mouth every 6 (six) hours as needed (pain).     [name] albuterol (VENTOLIN HFA) 108 (90 Base) MCG/ACT inhaler Inhale 2 puffs into the lungs every 6 (six) hours as needed for wheezing or shortness of breath. 8 g 2  . atorvastatin (LIPITOR) 80 MG tablet Take 1 tablet (80 mg total) by mouth daily. 90 tablet 3  . CHLORPHENIRAMINE-ACETAMINOPHEN PO Take 1 tablet by mouth 2 (two) times daily.    . Cholecalciferol (VITAMIN D3) 50 MCG (2000 UT) capsule Take 1 capsule (2,000 Units total) by mouth daily. 100 capsule 3  . clopidogrel (PLAVIX) 75 MG tablet Take 1 tablet (75 mg total) by mouth daily. 90 tablet 3  . famotidine (PEPCID) 20 MG tablet One at bedtime 90 tablet 2  . gabapentin (NEURONTIN) 100 MG capsule Take 1 capsule (100 mg total) by mouth 3 (three) times daily. One three times daily 270 capsule 2  . Investigational - Study Medication Take 3 capsules by mouth daily. Study name: Women's Health Initiative Study COSMOS  Additional study details: 1 cap is Calcium, 2 caps are Coco extract    . leflunomide (ARAVA) 20 MG tablet Take 20 mg by mouth daily.     [name] levothyroxine (SYNTHROID) 88 MCG tablet TAKE 1 TABLET BY MOUTH  DAILY BEFORE BREAKFAST.  KEEP SCHEDULED APPT FOR  FUTURE REFILLS 90 tablet 3  . loperamide (IMODIUM A-D) 2 MG tablet Take 2 mg by mouth 4 (four) times daily as needed for diarrhea or loose stools.    . metoprolol tartrate (LOPRESSOR) 50 MG tablet Take 1 tablet (50 mg total) by mouth 2 (two) times daily. 180 tablet 2  . pantoprazole (PROTONIX) 40 MG tablet Take 1 tablet (40 mg total) by mouth daily. Take 30-60 min before first meal of the day 90 tablet 3  . predniSONE (STERAPRED  UNI-PAK 48 TAB) 5 MG (48) TBPK tablet As directed x 12 days per Dr Lenna Gilford    . psyllium (METAMUCIL) 58.6 % packet Take 1 packet by mouth daily.    [name] triamcinolone (NASACORT ALLERGY 24HR) 55 MCG/ACT AERO nasal inhaler Place 1 spray into the nose daily.      No facility-administered medications prior to visit.    ROS: Review of Systems  Constitutional: Negative for activity change, appetite change, chills, fatigue and unexpected weight change.  HENT: Negative for congestion, mouth sores and sinus pressure.   Eyes: Negative for visual disturbance.  Respiratory: Negative for cough and chest tightness.   Gastrointestinal: Negative for abdominal pain and nausea.  Genitourinary: Negative for difficulty urinating, frequency and vaginal pain.  Musculoskeletal: Positive for arthralgias. Negative for back pain and gait problem.  Skin: Negative for pallor and rash.  Neurological: Negative for dizziness, tremors, weakness, numbness and headaches.  Psychiatric/Behavioral: Negative for confusion, sleep disturbance and suicidal ideas.    Objective:  BP (!) 180/98 (BP Location: Left Arm, Patient Position: Sitting, Cuff Size: Normal)   Pulse 68   Temp 98 F (36.7 C) (Oral)   Ht 5\' 3"  (1.6 m)   Wt 151 lb (68.5 kg)   SpO2 98%   BMI 26.75 kg/m   BP Readings from  Last 3 Encounters:  05/15/19 (!) 180/98  03/20/19 (!) 194/90  02/21/19 134/80    Wt Readings from Last 3 Encounters:  05/15/19 151 lb (68.5 kg)  03/20/19 151 lb 6.4 oz (68.7 kg)  02/21/19 148 lb 9.6 oz (67.4 kg)    Physical Exam Constitutional:      General: She is not in acute distress.    Appearance: She is well-developed.  HENT:     Head: Normocephalic.     Right Ear: External ear normal.     Left Ear: External ear normal.     Nose: Nose normal.  Eyes:     General:        Right eye: No discharge.        Left eye: No discharge.     Conjunctiva/sclera: Conjunctivae normal.     Pupils: Pupils are equal, round, and reactive  to light.  Neck:     Thyroid: No thyromegaly.     Vascular: No JVD.     Trachea: No tracheal deviation.  Cardiovascular:     Rate and Rhythm: Normal rate and regular rhythm.     Heart sounds: Normal heart sounds.  Pulmonary:     Effort: No respiratory distress.     Breath sounds: No stridor. No wheezing.  Abdominal:     General: Bowel sounds are normal. There is no distension.     Palpations: Abdomen is soft. There is no mass.     Tenderness: There is no abdominal tenderness. There is no guarding or rebound.  Musculoskeletal:        General: No tenderness.     Cervical back: Normal range of motion and neck supple.  Lymphadenopathy:     Cervical: No cervical adenopathy.  Skin:    Findings: No erythema or rash.  Neurological:     Cranial Nerves: No cranial nerve deficit.     Motor: No abnormal muscle tone.     Coordination: Coordination normal.     Deep Tendon Reflexes: Reflexes normal.  Psychiatric:        Behavior: Behavior normal.        Thought Content: Thought content normal.        Judgment: Judgment normal.     Lab Results  Component Value Date   WBC 11.4 (H) 02/21/2019   HGB 11.5 (L) 02/21/2019   HCT 35.2 (L) 02/21/2019   PLT 386.0 02/21/2019   GLUCOSE 109 (H) 02/21/2019   CHOL 152 11/13/2018   TRIG 57.0 11/13/2018   HDL 72.50 11/13/2018   LDLDIRECT 162.1 10/28/2009   LDLCALC 68 11/13/2018   ALT 28 11/13/2018   AST 23 11/13/2018   NA 135 02/21/2019   K 5.3 No hemolysis seen (H) 02/21/2019   CL 101 02/21/2019   CREATININE 1.50 (H) 02/21/2019   BUN 38 (H) 02/21/2019   CO2 27 02/21/2019   TSH 2.57 11/13/2018   INR 0.89 05/20/2018    DG Chest 2 View  Result Date: 01/27/2019 CLINICAL DATA:  Shortness of breath and dry cough for several months, coronary artery disease post CABG, hypertension, COPD, former smoker EXAM: CHEST - 2 VIEW COMPARISON:  01/22/2019 FINDINGS: Normal heart size post median sternotomy. Mediastinal contours and pulmonary vascularity  normal. Atherosclerotic calcification aorta. Emphysematous changes with minimal bibasilar atelectasis or scarring. Lungs otherwise clear. No pulmonary infiltrate, pleural effusion or pneumothorax. Bones demineralized. IMPRESSION: COPD changes with minimal bibasilar atelectasis or scarring. No acute abnormalities. Electronically Signed   By: Lavonia Dana M.D.   On:  01/27/2019 15:09   NM Pulmonary Perfusion  Result Date: 01/27/2019 CLINICAL DATA:  Worsening shortness of breath for past year, cannot walk very far without stopping and huffing EXAM: NUCLEAR MEDICINE PERFUSION LUNG SCAN TECHNIQUE: Perfusion images were obtained in multiple projections after intravenous injection of radiopharmaceutical. Ventilation scans intentionally deferred if perfusion scan and chest x-ray adequate for interpretation during COVID 19 epidemic. RADIOPHARMACEUTICALS:  1.55 mCi Tc-69m MAA IV COMPARISON:  None Correlation: Chest radiograph 01/27/2019 FINDINGS: Small subsegmental perfusion defect at LEFT lower lobe inferiorly. This corresponds to minimal subsegmental atelectasis on chest radiography. No additional perfusion defects identified. Findings represent a very low probability for pulmonary embolism. Ventilation exam was not required. IMPRESSION: Very low probability pulmonary embolism. Electronically Signed   By: Lavonia Dana M.D.   On: 01/27/2019 14:47    Assessment & Plan:   There are no diagnoses linked to this encounter.   No orders of the defined types were placed in this encounter.    Follow-up: No follow-ups on file.  [name], MD

## 2019-05-15 NOTE — Assessment & Plan Note (Addendum)
Re-start Losartan if K from yesterday labs is ok. Watch BMET

## 2019-05-15 NOTE — Assessment & Plan Note (Signed)
Labs

## 2019-05-15 NOTE — Patient Instructions (Addendum)
You can pre-medicate yourself with Benadryl 25 mg and Tylenol 650 mg 1 hour prior to the vaccination.   Re-start Losartan if Potassium from yesterday labs is ok.

## 2019-05-21 ENCOUNTER — Other Ambulatory Visit: Payer: Self-pay | Admitting: Internal Medicine

## 2019-05-21 ENCOUNTER — Telehealth: Payer: Self-pay | Admitting: Internal Medicine

## 2019-05-21 DIAGNOSIS — E875 Hyperkalemia: Secondary | ICD-10-CM

## 2019-05-21 MED ORDER — AMLODIPINE BESYLATE 5 MG PO TABS: 5.0000 mg | ORAL_TABLET | Freq: Every day | ORAL | 3 refills | Status: DC

## 2019-05-21 NOTE — Telephone Encounter (Signed)
Copied from [place] [id number]. Topic: Appointment Scheduling - Scheduling Inquiry for Clinic >> May 21, 2019  2:44 PM Erick Blinks wrote: Reason for CRM: Pt would like to schedule labs for Potassium, please advise. Needs orders from PCP

## 2019-05-22 NOTE — Telephone Encounter (Signed)
Please call pt to schedule lab appt

## 2019-05-23 ENCOUNTER — Other Ambulatory Visit: Payer: Self-pay

## 2019-05-23 ENCOUNTER — Other Ambulatory Visit (INDEPENDENT_AMBULATORY_CARE_PROVIDER_SITE_OTHER): Payer: Medicare PPO

## 2019-05-23 DIAGNOSIS — E875 Hyperkalemia: Secondary | ICD-10-CM

## 2019-05-23 LAB — BASIC METABOLIC PANEL
BUN: 33 mg/dL — ABNORMAL HIGH (ref 6–23)
CO2: 28 mEq/L (ref 19–32)
Calcium: 9 mg/dL (ref 8.4–10.5)
Chloride: 104 mEq/L (ref 96–112)
Creatinine, Ser: 1.46 mg/dL — ABNORMAL HIGH (ref 0.40–1.20)
GFR: 34.39 mL/min — ABNORMAL LOW (ref >60.00)
Glucose, Bld: 113 mg/dL — ABNORMAL HIGH (ref 70–99)
Potassium: 4.8 mEq/L (ref 3.5–5.1)
Sodium: 140 mEq/L (ref 135–145)

## 2019-05-27 ENCOUNTER — Other Ambulatory Visit: Payer: Self-pay | Admitting: Internal Medicine

## 2019-05-27 MED ORDER — LOSARTAN POTASSIUM 100 MG PO TABS: 50.0000 mg | ORAL_TABLET | Freq: Every day | ORAL | 3 refills | Status: DC

## 2019-06-10 DIAGNOSIS — M0579 Rheumatoid arthritis with rheumatoid factor of multiple sites without organ or systems involvement: Secondary | ICD-10-CM | POA: Diagnosis not present

## 2019-06-12 ENCOUNTER — Other Ambulatory Visit: Payer: Self-pay

## 2019-06-12 ENCOUNTER — Ambulatory Visit: Payer: Medicare PPO | Admitting: Internal Medicine

## 2019-06-12 ENCOUNTER — Encounter: Payer: Self-pay | Admitting: Internal Medicine

## 2019-06-12 DIAGNOSIS — I10 Essential (primary) hypertension: Secondary | ICD-10-CM | POA: Diagnosis not present

## 2019-06-12 MED ORDER — METOPROLOL TARTRATE 50 MG PO TABS: 75.0000 mg | ORAL_TABLET | Freq: Two times a day (BID) | ORAL | 3 refills | Status: DC

## 2019-06-12 NOTE — Patient Instructions (Signed)
Cut back on salt

## 2019-06-12 NOTE — Progress Notes (Signed)
Subjective:  Patient ID: [name], female    DOB: 03/17/1941  Age: 79 y.o. MRN: [id number]  CC: No chief complaint on file.   HPI [name] presents for elevated BP - SBP was 200 at times. SBP 150-160   Outpatient Medications Prior to Visit  Medication Sig Dispense Refill  . acetaminophen (TYLENOL) 500 MG tablet Take 500 mg by mouth every 6 (six) hours as needed (pain).     [name] albuterol (VENTOLIN HFA) 108 (90 Base) MCG/ACT inhaler Inhale 2 puffs into the lungs every 6 (six) hours as needed for wheezing or shortness of breath. 8 g 2  . amLODipine (NORVASC) 5 MG tablet Take 1 tablet (5 mg total) by mouth daily. 90 tablet 3  . atorvastatin (LIPITOR) 80 MG tablet Take 1 tablet (80 mg total) by mouth daily. 90 tablet 3  . CHLORPHENIRAMINE-ACETAMINOPHEN PO Take 1 tablet by mouth 2 (two) times daily.    . Cholecalciferol (VITAMIN D3) 50 MCG (2000 UT) capsule Take 1 capsule (2,000 Units total) by mouth daily. 100 capsule 3  . clopidogrel (PLAVIX) 75 MG tablet Take 1 tablet (75 mg total) by mouth daily. 90 tablet 3  . famotidine (PEPCID) 20 MG tablet One at bedtime 90 tablet 2  . gabapentin (NEURONTIN) 100 MG capsule Take 1 capsule (100 mg total) by mouth 3 (three) times daily. One three times daily 270 capsule 2  . leflunomide (ARAVA) 20 MG tablet Take 20 mg by mouth daily.     [name] levothyroxine (SYNTHROID) 88 MCG tablet Take 1 tablet (88 mcg total) by mouth daily before breakfast. 90 tablet 3  . loperamide (IMODIUM A-D) 2 MG tablet Take 2 mg by mouth 4 (four) times daily as needed for diarrhea or loose stools.    [name] losartan (COZAAR) 100 MG tablet Take 0.5 tablets (50 mg total) by mouth daily. 90 tablet 3  . metoprolol tartrate (LOPRESSOR) 50 MG tablet Take 1 tablet (50 mg total) by mouth 2 (two) times daily. 180 tablet 3  . pantoprazole (PROTONIX) 40 MG tablet Take 1 tablet (40 mg total) by mouth daily. Take 30-60 min before first meal of the day 90 tablet 3  . predniSONE (STERAPRED  UNI-PAK 48 TAB) 5 MG (48) TBPK tablet As directed x 12 days per Dr Lenna Gilford    . psyllium (METAMUCIL) 58.6 % packet Take 1 packet by mouth daily.    [name] triamcinolone (NASACORT ALLERGY 24HR) 55 MCG/ACT AERO nasal inhaler Place 1 spray into the nose daily.     . Investigational - Study Medication Take 3 capsules by mouth daily. Study name: Women's Health Initiative Study COSMOS  Additional study details: 1 cap is Calcium, 2 caps are Coco extract     No facility-administered medications prior to visit.    ROS: Review of Systems  Constitutional: Negative for activity change, appetite change, chills, fatigue and unexpected weight change.  HENT: Negative for congestion, mouth sores and sinus pressure.   Eyes: Negative for visual disturbance.  Respiratory: Negative for cough and chest tightness.   Gastrointestinal: Negative for abdominal pain and nausea.  Genitourinary: Negative for difficulty urinating, frequency and vaginal pain.  Musculoskeletal: Negative for back pain and gait problem.  Skin: Negative for pallor and rash.  Neurological: Negative for dizziness, tremors, weakness, numbness and headaches.  Psychiatric/Behavioral: Negative for confusion and sleep disturbance.    Objective:  BP (!) 150/76 (BP Location: Left Arm, Patient Position: Sitting, Cuff Size: Normal)   Pulse 75  Temp 98.2 F (36.8 C) (Oral)   Ht 5\' 3"  (1.6 m)   Wt 147 lb (66.7 kg)   SpO2 96%   BMI 26.04 kg/m   BP Readings from Last 3 Encounters:  06/12/19 (!) 150/76  05/15/19 (!) 180/98  03/20/19 (!) 194/90    Wt Readings from Last 3 Encounters:  06/12/19 147 lb (66.7 kg)  05/15/19 151 lb (68.5 kg)  03/20/19 151 lb 6.4 oz (68.7 kg)    Physical Exam Constitutional:      General: She is not in acute distress.    Appearance: She is well-developed.  HENT:     Head: Normocephalic.     Right Ear: External ear normal.     Left Ear: External ear normal.     Nose: Nose normal.  Eyes:     General:         Right eye: No discharge.        Left eye: No discharge.     Conjunctiva/sclera: Conjunctivae normal.     Pupils: Pupils are equal, round, and reactive to light.  Neck:     Thyroid: No thyromegaly.     Vascular: No JVD.     Trachea: No tracheal deviation.  Cardiovascular:     Rate and Rhythm: Normal rate and regular rhythm.     Heart sounds: Normal heart sounds.  Pulmonary:     Effort: No respiratory distress.     Breath sounds: No stridor. No wheezing.  Abdominal:     General: Bowel sounds are normal. There is no distension.     Palpations: Abdomen is soft. There is no mass.     Tenderness: There is no abdominal tenderness. There is no guarding or rebound.  Musculoskeletal:        General: No tenderness.     Cervical back: Normal range of motion and neck supple.  Lymphadenopathy:     Cervical: No cervical adenopathy.  Skin:    Findings: No erythema or rash.  Neurological:     Cranial Nerves: No cranial nerve deficit.     Motor: No abnormal muscle tone.     Coordination: Coordination normal.     Deep Tendon Reflexes: Reflexes normal.  Psychiatric:        Behavior: Behavior normal.        Thought Content: Thought content normal.        Judgment: Judgment normal.     Lab Results  Component Value Date   WBC 11.4 (H) 02/21/2019   HGB 11.5 (L) 02/21/2019   HCT 35.2 (L) 02/21/2019   PLT 386.0 02/21/2019   GLUCOSE 113 (H) 05/23/2019   CHOL 152 11/13/2018   TRIG 57.0 11/13/2018   HDL 72.50 11/13/2018   LDLDIRECT 162.1 10/28/2009   LDLCALC 68 11/13/2018   ALT 28 11/13/2018   AST 23 11/13/2018   NA 140 05/23/2019   K 4.8 05/23/2019   CL 104 05/23/2019   CREATININE 1.46 (H) 05/23/2019   BUN 33 (H) 05/23/2019   CO2 28 05/23/2019   TSH 2.57 11/13/2018   INR 0.89 05/20/2018    DG Chest 2 View  Result Date: 01/27/2019 CLINICAL DATA:  Shortness of breath and dry cough for several months, coronary artery disease post CABG, hypertension, COPD, former smoker EXAM: CHEST - 2  VIEW COMPARISON:  01/22/2019 FINDINGS: Normal heart size post median sternotomy. Mediastinal contours and pulmonary vascularity normal. Atherosclerotic calcification aorta. Emphysematous changes with minimal bibasilar atelectasis or scarring. Lungs otherwise clear. No pulmonary infiltrate, pleural  effusion or pneumothorax. Bones demineralized. IMPRESSION: COPD changes with minimal bibasilar atelectasis or scarring. No acute abnormalities. Electronically Signed   By: Lavonia Dana M.D.   On: 01/27/2019 15:09   NM Pulmonary Perfusion  Result Date: 01/27/2019 CLINICAL DATA:  Worsening shortness of breath for past year, cannot walk very far without stopping and huffing EXAM: NUCLEAR MEDICINE PERFUSION LUNG SCAN TECHNIQUE: Perfusion images were obtained in multiple projections after intravenous injection of radiopharmaceutical. Ventilation scans intentionally deferred if perfusion scan and chest x-ray adequate for interpretation during COVID 19 epidemic. RADIOPHARMACEUTICALS:  1.55 mCi Tc-50m MAA IV COMPARISON:  None Correlation: Chest radiograph 01/27/2019 FINDINGS: Small subsegmental perfusion defect at LEFT lower lobe inferiorly. This corresponds to minimal subsegmental atelectasis on chest radiography. No additional perfusion defects identified. Findings represent a very low probability for pulmonary embolism. Ventilation exam was not required. IMPRESSION: Very low probability pulmonary embolism. Electronically Signed   By: Lavonia Dana M.D.   On: 01/27/2019 14:47    Assessment & Plan:   There are no diagnoses linked to this encounter.   No orders of the defined types were placed in this encounter.    Follow-up: No follow-ups on file.  [name], MD

## 2019-06-13 ENCOUNTER — Encounter: Payer: Self-pay | Admitting: Internal Medicine

## 2019-06-13 NOTE — Assessment & Plan Note (Signed)
We will increase metoprolol to 75 mg twice a day.  Reduce salt intake.  Follow-up with me in 4 weeks

## 2019-06-30 ENCOUNTER — Telehealth: Payer: Self-pay | Admitting: Internal Medicine

## 2019-06-30 MED ORDER — GABAPENTIN 100 MG PO CAPS: 200.0000 mg | ORAL_CAPSULE | Freq: Three times a day (TID) | ORAL | 1 refills | Status: DC

## 2019-06-30 NOTE — Telephone Encounter (Signed)
Spoke with the pt  She states that she is controlled on 100 mg 2 tid  Rx sent  Nothing further needed

## 2019-06-30 NOTE — Telephone Encounter (Signed)
Was supposed to:   titrate up to 300 mg tid if tolerates If cough better on the 200 mg (two x 100) tid she can have a 3 m supply.  If not the plan was to change to 300 mg ( one pill) tid  X 3 m supply

## 2019-06-30 NOTE — Telephone Encounter (Signed)
Dr. Melvyn Novas,  I called the patient back and she stated you told her to take 2 tablets three times daily. The patient was last seen by you on 03/20/19.  There is a telephone note dated 03/20/19 showing the patient prescription sent to [facility] on 05/08/19 of 270 capsules (1 tablet 3 times day) with 2 refills, which looks like it was based on the last visit.  If the gabapentin was to be taken as 2 tablets three times a day a new prescription has to be issued.  Please confirm that this was correct and the patient did not get confused as to how the medication was to be taken. Thank you.

## 2019-07-03 ENCOUNTER — Other Ambulatory Visit: Payer: Self-pay

## 2019-07-03 ENCOUNTER — Encounter: Payer: Self-pay | Admitting: Cardiothoracic Surgery

## 2019-07-03 ENCOUNTER — Ambulatory Visit
Admission: RE | Admit: 2019-07-03 | Discharge: 2019-07-03 | Disposition: A | Discharge status: Home / Self Care | Payer: Medicare PPO | Source: Ambulatory Visit | Attending: Cardiothoracic Surgery | Admitting: Cardiothoracic Surgery

## 2019-07-03 ENCOUNTER — Ambulatory Visit: Payer: Medicare PPO | Admitting: Cardiothoracic Surgery

## 2019-07-03 VITALS — BP 150/78 | HR 69 | Temp 97.6°F | Resp 20 | Ht 63.0 in | Wt 151.0 lb

## 2019-07-03 DIAGNOSIS — R918 Other nonspecific abnormal finding of lung field: Secondary | ICD-10-CM

## 2019-07-03 DIAGNOSIS — R911 Solitary pulmonary nodule: Secondary | ICD-10-CM

## 2019-07-03 NOTE — Progress Notes (Signed)
[address]       [place] [address]             [phone]      Mirabelle E [facility] Medical Record #[id number] Date of Birth: 11-27-1940  Referring: Tanda Rockers, MD Primary Care: Cassandria Anger, MD  Chief Complaint:   POST OP FOLLOW UP 10/08/2017 PREOPERATIVE DIAGNOSIS:  Left lower and left upper lobe lung nodules. POSTOPERATIVE DIAGNOSIS:  Left lower and left upper lobe lung nodules. SURGICAL PROCEDURE:  Video bronchoscopy, electromagnetic navigation bronchoscopy with biopsies of left lower lobe and left upper lobe lung mass. SURGEON:  Lilia Argue. Servando Snare, MD  03/02/2015 OPERATIVE REPORT PREOPERATIVE DIAGNOSIS: Left upper lobe lung mass POSTOPERATIVE DIAGNOSIS: Left upper lobe lung mass. 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 PROCEDURE PERFORMED: Video bronchoscopy, left video-assisted thoracoscopy, minithoracotomy, wedge resection of left upper lobe. SURGEON: Lanelle Bal, M.D Previous left VATS lung resection Lung, wedge biopsy/resection, left upper lobe - NECROTIZING GRANULOMA, 1.7 CM. - NO TUMOR SEEN. - SEE COMMENT. Microscopic Comment Sections demonstrate a 1.7 cm necrotizing granuloma. There is associated reactive pneumocyte hyperplasia with reactive atypia but no true tumor is identified. An AFB stain is performed on a representative section which is negative for acid fast bacilli. A GMS and PAS stain are also performed on a representative section which are both negative for fungal organisms. Although specimen stains for microorganisms are negative, stains are not as sensitive as other microbiologic techniques including culture. Please correlate with culture results. (RAH:gt, 03/04/15) [name] Pathologist, Electronic Signature (Case signed 03/04/2015  History of Present Illness:   Patient returns to the office today for follow-up CT scan of the chest.  She is been followed since 2016 for waxing and  waning inflammatory lesions in the lung.  A wedge resection of the left upper lobe was done in 2016, needle biopsy of left lower lobe lung lesion was done in January 2020.       CT directed needle biopsy was done May 20, 2018, the final result of this biopsy noted below but again was inflammatory in nature  Since last seen she has had no new symptoms no cough fever chills.  She had increasing breathlessness especially with activity.  She notes she is no longer able to carry anything or climb stairs.  The patient comes in today with a repeat CT of the chest-  She did smoke for 5-7 years but quit at age 64.    Past Medical History:  Diagnosis Date  . Abdominal aortic aneurysm (HCC)    REPAIRED IN 1996 BY [name]  AND HAS RECENTLY BEEN FOLLOWED BY [name]  . 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 Adenomatous colon polyp    tubular  . Anemia   . CAD in native artery 1996, 2002, 2005    Status post CABG x1 with LIMA-LAD for ostial LAD 90% stenosis --> down to 50% in 2002 and 30% in 2005.;  Atretic LIMA; Myoview 06/2010: Fixed anteroseptal, apical and inferoapical defect with moderate size. Most likely scar. Mild subendocardial ischemia. EF 71% LOW RISK.   [name] Cervical disc disease    fracture  . Chronic kidney disease   . COPD (chronic obstructive pulmonary disease) ([place])   . Diverticulosis   . Hyperlipidemia   . Hypertension   . Hypothyroidism (acquired)    hypo  . Myocardial  infarction [facility]) Aug. 2016   TIA  . Polymyalgia rheumatica (HCC)    2011 Dr. Charlestine Night  . Rheumatoid arthritis ([facility]) 2011   Dr.Truslow; fracture knees, hands and wrists -   . S/P CABG x 1 1996   CABG--LIMA-LAD for ostial LAD (not felt to be PCI amenable). EF NORMAL then; LIMA now atretic  . Shortness of breath dyspnea    with exertion  . Stroke [facility]) 11-2014   TIA   . Urinary frequency      Social History   Tobacco Use    Smoking Status Former Smoker  . Quit date: 12/04/1958  . Years since quitting: 60.6  Smokeless Tobacco Never Used    Social History   Substance and Sexual Activity  Alcohol Use Yes   Comment: occassionally     Allergies  Allergen Reactions  . Aspirin Other (See Comments)    Hurts stomach  . Codeine Nausea And Vomiting  . Nitroglycerin Other (See Comments)    Heart rate drops    Current Outpatient Medications  Medication Sig Dispense Refill  . acetaminophen (TYLENOL) 500 MG tablet Take 500 mg by mouth every 6 (six) hours as needed (pain).     [name] albuterol (VENTOLIN HFA) 108 (90 Base) MCG/ACT inhaler Inhale 2 puffs into the lungs every 6 (six) hours as needed for wheezing or shortness of breath. 8 g 2  . amLODipine (NORVASC) 5 MG tablet Take 1 tablet (5 mg total) by mouth daily. 90 tablet 3  . atorvastatin (LIPITOR) 80 MG tablet Take 1 tablet (80 mg total) by mouth daily. 90 tablet 3  . CHLORPHENIRAMINE-ACETAMINOPHEN PO Take 1 tablet by mouth 2 (two) times daily.    . Cholecalciferol (VITAMIN D3) 50 MCG (2000 UT) capsule Take 1 capsule (2,000 Units total) by mouth daily. 100 capsule 3  . clopidogrel (PLAVIX) 75 MG tablet Take 1 tablet (75 mg total) by mouth daily. 90 tablet 3  . famotidine (PEPCID) 20 MG tablet One at bedtime 90 tablet 2  . gabapentin (NEURONTIN) 100 MG capsule Take 2 capsules (200 mg total) by mouth 3 (three) times daily. One three times daily 540 capsule 1  . leflunomide (ARAVA) 20 MG tablet Take 20 mg by mouth daily.     [name] levothyroxine (SYNTHROID) 88 MCG tablet Take 1 tablet (88 mcg total) by mouth daily before breakfast. 90 tablet 3  . loperamide (IMODIUM A-D) 2 MG tablet Take 2 mg by mouth 4 (four) times daily as needed for diarrhea or loose stools.    [name] losartan (COZAAR) 100 MG tablet Take 0.5 tablets (50 mg total) by mouth daily. 90 tablet 3  . metoprolol tartrate (LOPRESSOR) 50 MG tablet Take 1.5 tablets (75 mg total) by mouth 2 (two) times daily. 180  tablet 3  . pantoprazole (PROTONIX) 40 MG tablet Take 1 tablet (40 mg total) by mouth daily. Take 30-60 min before first meal of the day 90 tablet 3  . predniSONE (STERAPRED UNI-PAK 48 TAB) 5 MG (48) TBPK tablet As directed x 12 days per Dr Lenna Gilford    . psyllium (METAMUCIL) 58.6 % packet Take 1 packet by mouth daily.    [name] triamcinolone (NASACORT ALLERGY 24HR) 55 MCG/ACT AERO nasal inhaler Place 1 spray into the nose daily.      No current facility-administered medications for this visit.   No change in review of systems since last week   Physical Exam: BP (!) 150/78   Pulse 69   Temp 97.6 F (36.4 C) (  Skin)   Resp 20   Ht 5\' 3"  (1.6 m)   Wt 151 lb (68.5 kg)   SpO2 95% Comment: RA  BMI 26.75 kg/m  General appearance: alert, cooperative and no distress Neck: no adenopathy, no carotid bruit, no JVD, supple, symmetrical, trachea midline and thyroid not enlarged, symmetric, no tenderness/mass/nodules Lymph nodes: Cervical, supraclavicular, and axillary nodes normal. Resp: clear to auscultation bilaterally Cardio: regular rate and rhythm, S1, S2 normal, no murmur, click, rub or gallop GI: soft, non-tender; bowel sounds normal; no masses,  no organomegaly Extremities: extremities normal, atraumatic, no cyanosis or edema and Homans sign is negative, no sign of DVT Neurologic: Grossly normal   Diagnostic Studies & Laboratory data:     Recent Radiology Findings: CT CHEST WO CONTRAST  Result Date: 07/03/2019 CLINICAL DATA:  Follow-up pulmonary nodules. EXAM: CT CHEST WITHOUT CONTRAST TECHNIQUE: Multidetector CT imaging of the chest was performed following the standard protocol without IV contrast. COMPARISON:  01/03/2018 FINDINGS: Cardiovascular: Previous median sternotomy and CABG procedure. No pericardial effusion. Aortic atherosclerosis. Mediastinum/Nodes: No enlarged mediastinal or hilar adenopathy. The thyroid gland appears atrophic. The trachea appears patent and is midline. Normal  appearance of the esophagus. Lungs/Pleura: Previous wedge resection identified within the left upper lobe. The index lung nodule near the suture line in the left upper lobe measures 0.6 cm, image 20/8. Previously 0.7 cm. The previous left lower lobe solid nodule now appears predominantly cystic measuring 1.3 x 1.3 cm, image 118/8. Previously this measured 2.4 x 1.7 cm. There is a new subpleural nodule within the periphery of the right lower lobe which measures 0.8 x 0.5 cm, image 113/8. Upper Abdomen: There is hypertrophy of the lateral segment of left lobe of liver. The contour the liver appears slightly irregular. No acute abnormality noted within the upper abdomen. Cysts within the upper pole of left kidney are identified and incompletely characterized without IV contrast. Musculoskeletal: No chest wall mass or suspicious bone lesions identified. IMPRESSION: 1. The previous solid-appearing nodule within the left lower lobe has decreased in size in the interval and now appears cystic. 2. Left upper lobe lung nodule is slightly decreased in size from previous exam. 3. There is a new lung nodule within the periphery of the right lower lobe which has a mean diameter of 7 mm. This is indeterminate. A small neoplasm cannot be excluded 4.  Aortic Atherosclerosis (ICD10-I70.0). Electronically Signed   By: Kerby Moors M.D.   On: 07/03/2019 14:12    I have independently reviewed the above radiology studies  and reviewed the findings with the patient.     Ct Super D Chest Wo Contrast  Result Date: 10/04/2017 CLINICAL DATA:  Lung nodule. EXAM: CT CHEST WITHOUT CONTRAST TECHNIQUE: Multidetector CT imaging of the chest was performed using thin slice collimation for electromagnetic bronchoscopy planning purposes, without intravenous contrast. COMPARISON:  CT chest 06/28/2017. FINDINGS: Cardiovascular: The heart size appears within normal limits. No pericardial effusion. Previous median sternotomy and CABG procedure.  Aortic atherosclerosis noted. Mediastinum/Nodes: Normal appearance of the thyroid gland. The trachea appears patent and is midline. Normal appearance of the esophagus. No enlarged mediastinal or hilar lymph nodes. No axillary or supraclavicular adenopathy. Lungs/Pleura: Postoperative changes involving the left upper lobe are again noted. The index nodule within the left upper lobe measures 7 mm, image 18/8. This is compared with 4 mm previously. The index nodule within the left lower lobe measures 1.2 x 1.2 cm, image 117/8. Previously 1.1 x 0.9 cm. 4 mm right  lower lobe lung nodule is noted, image 125/8. Previously this measured the same. Within the medial right lower lobe there is an 8 mm subpleural nodule, image 72/2. Stable from comparison exam. In the right upper lobe there is a small nodule measuring 5 mm, image 43/8. Stable from previous exam. Upper Abdomen: Lobular appearing left kidney noted. The adrenal glands are unremarkable. No acute abnormality noted. Musculoskeletal: Mild degenerative disc disease noted throughout the thoracic spine. No suspicious bone lesions. IMPRESSION: 1. There has been increase in size of left lower lobe lung nodule compared with 06/28/2017. Small left upper lobe lung nodule is also increased in size. 2. Right lower lobe and right upper lobe lung nodules are stable in the interval. 3.  Aortic Atherosclerosis (ICD10-I70.0). Electronically Signed   By: Kerby Moors M.D.   On: 10/04/2017 10:57       CLINICAL DATA:  Subsequent treatment strategy for lung nodule.  EXAM: NUCLEAR MEDICINE PET SKULL BASE TO THIGH  TECHNIQUE: 7.8 mCi F-18 FDG was injected intravenously. Full-ring PET imaging was performed from the skull base to thigh after the radiotracer. CT data was obtained and used for attenuation correction and anatomic localization.  Fasting blood glucose: 100 mg/dl  Mediastinal blood pool activity: SUV max 3.2  COMPARISON:  CT chest 06/28/2017,  06/08/2016, 04/15/2016, PET 02/10/2015 and CT chest 01/29/2015.  FINDINGS: NECK: No hypermetabolic lymph nodes in the neck.  Incidental CT findings: None.  CHEST: Nodular scarring in the medial left upper lobe does not show abnormal hypermetabolism. A 10 mm round nodule in the inferior left lower lobe has an SUV max 3.4, is stable in size from 06/28/2017 but new from CT chest 06/08/2016. No hypermetabolic mediastinal, hilar or axillary lymph nodes.  Incidental CT findings: Atherosclerotic calcification of the arterial vasculature. Heart is enlarged. No pericardial or pleural effusion. 6 mm apical left upper lobe nodule (series 8, image 11), too small for PET resolution.  ABDOMEN/PELVIS: No abnormal hypermetabolism in the liver, adrenal glands, spleen or pancreas. No hypermetabolic lymph nodes.  Incidental CT findings: Low-attenuation lesions in the kidneys are difficult to further characterize without post-contrast imaging. Splenectomy.  SKELETON: No abnormal osseous hypermetabolism.  Incidental CT findings: Degenerative changes in the spine.  IMPRESSION: 1. Mildly hypermetabolic left lower lobe nodule, stable in size from 06/28/2017 but new from 06/08/2016. Findings are indeterminate with both malignant and infectious etiologies considered, given the short interval from 06/28/2017. Consider follow-up CT chest in 3-4 weeks in further initial evaluation. 2. 6 mm apical left upper lobe nodule is too small for PET resolution. 3.  Aortic atherosclerosis (ICD10-170.0).   Electronically Signed   By: Lorin Picket M.D.   On: 07/05/2017 09:09     Dg Chest 2 View  Result Date: 04/17/2016 CLINICAL DATA:  Productive cough for the past month. EXAM: CHEST  2 VIEW COMPARISON:  04/15/2016; 07/22/2015; chest CT - 04/15/2016 FINDINGS: Grossly unchanged cardiac silhouette and mediastinal contours post median sternotomy. Atherosclerotic plaque within the thoracic aorta.  The lungs remain hyperexpanded with flattening of the diaphragms and thinning of the biapical pulmonary parenchyma. Known ill-defined soft tissue about the left upper lobe surgical resection site is not well demonstrated on the present examination. No focal airspace opacities. No pleural effusion or pneumothorax. No evidence of edema. IMPRESSION: 1. Hyperexpanded lungs without acute cardiopulmonary disease. Emphysema. (ICD10-J43.9) 2. Previously identified soft tissue about the left upper lobe surgical resection site is suboptimally evaluated on the present examination. Correlation with report from chest CT performed 04/15/2016  is recommended. Electronically Signed   By: Sandi Mariscal M.D.   On: 04/17/2016 15:42     Ct Angio Chest Pe W And/or Wo Contrast  Result Date: 04/15/2016 CLINICAL DATA:  Elevated D-dimer.  Productive cough congestion. EXAM: CT ANGIOGRAPHY CHEST WITH CONTRAST TECHNIQUE: Multidetector CT imaging of the chest was performed using the standard protocol during bolus administration of intravenous contrast. Multiplanar CT image reconstructions and MIPs were obtained to evaluate the vascular anatomy. CONTRAST:  80 cc Isovue 370 IV COMPARISON:  11/23/2015 FINDINGS: Cardiovascular: No filling defects in the pulmonary arteries to suggest pulmonary emboli. Heart is normal size. Prior CABG. Aorta is tortuous with scattered calcifications, non aneurysmal. Mediastinum/Nodes: Mildly enlarged mediastinal lymph nodes. Subcarinal lymph node has a short axis diameter of 13 mm. Other small scattered paratracheal and AP window lymph nodes. No hilar or axillary adenopathy. Lungs/Pleura: Increasing density noted in the medial left upper lobe in the area of prior postoperative change. This soft tissue measures 2.2 x 1.7 cm on image 15. Given the location in the postoperative bed, this is concerning for tumor recurrence. Nodule in the left lower lobe on image 38 measures 8 mm on image 38, stable. Scarring noted in  the lung bases. No pleural effusions. Upper Abdomen: Imaging into the upper abdomen shows no acute findings. Musculoskeletal: Chest wall soft tissues are unremarkable. No acute bony abnormality or focal bone lesion. Review of the MIP images confirms the above findings. IMPRESSION: Enlarging abnormal soft tissue in the left upper lobe at the prior surgical site concerning for recurrent tumor. This measures up to 2.2 cm. This could be further evaluated with PET CT. Stable left lower lobe pulmonary nodule. Borderline and mildly enlarged mediastinal lymph nodes. Electronically Signed   By: Rolm Baptise M.D.   On: 04/15/2016 15:26    Ct Chest Wo Contrast  Result Date: 11/23/2015 CLINICAL DATA:  Followup left lung nodule. Previous wedge resection revealed necrotizing granuloma. EXAM: CT CHEST WITHOUT CONTRAST TECHNIQUE: Multidetector CT imaging of the chest was performed following the standard protocol without IV contrast. COMPARISON:  01/29/2015 FINDINGS: Cardiovascular: Heart size within normal limits. Aortic atherosclerosis noted. Previous CABG. Mediastinum/Lymph Nodes: No masses or pathologically enlarged lymph nodes identified on this un-enhanced exam. Lungs/Pleura: Postop changes seen in the medial left upper lobe from previous wedge resection. 8 mm mean diameter perifissural nodule nodule is seen in the left midlung on image 66/4. This is new since previous study, but likely represents an intrapulmonary lymph node. Mild centrilobular emphysema noted. Bibasilar scarring and tiny sub-cm posterior right lower lobe pulmonary nodule remains stable. No evidence of acute infiltrate or pleural effusion. Upper abdomen: No acute findings. Musculoskeletal: No chest wall mass or suspicious bone lesions identified. IMPRESSION: Postop changes in left upper lobe. 8 mm mean diameter perifissural nodule in the left midlung is new, but likely represents an intrapulmonary lymph node. Non-contrast chest CT at 6-12 months is  recommended. If the nodule is stable at time of repeat CT, then future CT at 18-24 months (from today's scan) is considered optional for low-risk patients, but is recommended for high-risk patients. This recommendation follows the consensus statement: Guidelines for Management of Incidental Pulmonary Nodules Detected on CT Images:From the Fleischner Society 2017; published online before print (10.1148/radiol.9390300923). Aortic atherosclerosis and emphysema. Electronically Signed   By: Earle Gell M.D.   On: 11/23/2015 15:46    Recent Lab Findings: Lab Results  Component Value Date   WBC 11.4 (H) 02/21/2019   HGB 11.5 (L) 02/21/2019  HCT 35.2 (L) 02/21/2019   PLT 386.0 02/21/2019   GLUCOSE 113 (H) 05/23/2019   CHOL 152 11/13/2018   TRIG 57.0 11/13/2018   HDL 72.50 11/13/2018   LDLDIRECT 162.1 10/28/2009   LDLCALC 68 11/13/2018   ALT 28 11/13/2018   AST 23 11/13/2018   NA 140 05/23/2019   K 4.8 05/23/2019   CL 104 05/23/2019   CREATININE 1.46 (H) 05/23/2019   BUN 33 (H) 05/23/2019   CO2 28 05/23/2019   TSH 2.57 11/13/2018   INR 0.89 05/20/2018   10/2015   03/2016   PFT's 10 /2016 FEV1 1.58 80% DLCO 16.27 71%  Interpretation: The FEV1 is normal, but the FEV1/FVC ratio and FEF25-75% are reduced. While the TLC, FRC and SVC are within normal limits, the RV is increased. Following administration of bronchodilators, there is a slight response. The reduced diffusing capacity indicates a mild loss of functional alveolar capillary surface. However, the diffusing capacity was not corrected for the patient's hemoglobin. Pulmonary Function Diagnosis: Minimal Obstructive Airways Disease with reversibility Mild Diffusion Defect            Recent pathology needle biopsy: Diagnosis Lung, needle/core biopsy(ies), left lower - NECROSIS WITH FOCAL FIBROSIS. - SEE MICROSCOPIC DESCRIPTION. - NO MALIGNANCY IDENTIFIED. Microscopic Comment The core biopsies consist mostly  of necrotic debris with one core partially consisting of connective tissue with fibrosis and granulation tissue. No giant cells or malignancy are identified. Special stains for microorganisms will be performed and reported as an addendum. (JDP:ecj 05/21/2018) Claudette Laws MD  ADDITIONAL INFORMATION: Special stains are performed and no acid fat bacilli are identified with AFB stain and no fungi are identified with GMS or PAS stains. (JDP:ecj 05/22/2018) Claudette Laws MD   Assessment / Plan:   Continued waxing and waning of pulmonary nodules, the area in the left lower lobe is now become cystic in nature.  There is a new 7 mm pleural-based lesion in the right lung-I have reviewed the CT scans with the patient and will plan on follow-up CT scan in 6 months.      Grace Isaac MD      [address] [place] [address] Office [phone]   Beeper [phone]  07/03/2019 2:34 PM

## 2019-07-07 ENCOUNTER — Encounter: Payer: Self-pay | Admitting: Cardiology

## 2019-07-07 ENCOUNTER — Telehealth (INDEPENDENT_AMBULATORY_CARE_PROVIDER_SITE_OTHER): Payer: Medicare PPO | Admitting: Cardiology

## 2019-07-07 VITALS — BP 157/93 | HR 60 | Ht 63.0 in | Wt 155.0 lb

## 2019-07-07 DIAGNOSIS — J449 Chronic obstructive pulmonary disease, unspecified: Secondary | ICD-10-CM

## 2019-07-07 DIAGNOSIS — I129 Hypertensive chronic kidney disease with stage 1 through stage 4 chronic kidney disease, or unspecified chronic kidney disease: Secondary | ICD-10-CM

## 2019-07-07 DIAGNOSIS — R918 Other nonspecific abnormal finding of lung field: Secondary | ICD-10-CM | POA: Diagnosis not present

## 2019-07-07 DIAGNOSIS — I713 Abdominal aortic aneurysm, ruptured, unspecified: Secondary | ICD-10-CM

## 2019-07-07 DIAGNOSIS — M069 Rheumatoid arthritis, unspecified: Secondary | ICD-10-CM | POA: Diagnosis not present

## 2019-07-07 DIAGNOSIS — N183 Chronic kidney disease, stage 3 unspecified: Secondary | ICD-10-CM | POA: Diagnosis not present

## 2019-07-07 DIAGNOSIS — Z951 Presence of aortocoronary bypass graft: Secondary | ICD-10-CM | POA: Diagnosis not present

## 2019-07-07 DIAGNOSIS — E785 Hyperlipidemia, unspecified: Secondary | ICD-10-CM

## 2019-07-07 NOTE — Progress Notes (Signed)
Virtual Visit via Video Note   This visit type was conducted due to national recommendations for restrictions regarding the COVID-19 Pandemic (e.g. social distancing) in an effort to limit this patient's exposure and mitigate transmission in our community.  Due to her co-morbid illnesses, this patient is at least at moderate risk for complications without adequate follow up.  This format is felt to be most appropriate for this patient at this time.  All issues noted in this document were discussed and addressed.  A limited physical exam was performed with this format.  Please refer to the patient's chart for her consent to telehealth for [facility].   The patient was identified using 2 identifiers.  Date:  07/07/2019   ID:  [name], DOB [birth year], MRN [id number]  Patient Location: Home Provider Location: Home  PCP:  Cassandria Anger, MD  Cardiologist:  Glenetta Hew, MD  Electrophysiologist:  None   Evaluation Performed:  Follow-Up Visit  Chief Complaint:  none  History of Present Illness:    [name] is a pleasant 79 y.o. female with a history of coronary disease, status post CABG x1 in 1996.  She is done well since from a cardiac standpoint.  Myoview in 2012 low risk.  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  She was contacted today for routine follow-up.  Since Dr. Ellyn Hack saw her last she is done well.  She denies any chest pain.  She is not had any issues during the Covid pandemic.  She has had both her vaccinations. She just saw Dr Servando Snare in the office to review her recent CT scan, the plan is for f/u CT in 6 months.   The patient does not have symptoms concerning for COVID-19 infection (fever, chills, cough, or new shortness of breath).     Past Medical History:  Diagnosis Date  . Abdominal aortic aneurysm (HCC)    REPAIRED IN 1996 BY [name]  AND HAS RECENTLY BEEN FOLLOWED BY [name]  . 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 Adenomatous colon polyp    tubular  . Anemia   . CAD in native artery 1996, 2002, 2005    Status post CABG x1 with LIMA-LAD for ostial LAD 90% stenosis --> down to 50% in 2002 and 30% in 2005.;  Atretic LIMA; Myoview 06/2010: Fixed anteroseptal, apical and inferoapical defect with moderate size. Most likely scar. Mild subendocardial ischemia. EF 71% LOW RISK.   [name] Cervical disc disease    fracture  . Chronic kidney disease   . COPD (chronic obstructive pulmonary disease) ([place])   . Diverticulosis   . Hyperlipidemia   . Hypertension   . Hypothyroidism (acquired)    hypo  . Myocardial infarction [facility]) Aug. 2016   TIA  . Polymyalgia rheumatica (HCC)    2011 Dr. Charlestine Night  . Rheumatoid arthritis ([place]) 2011   Dr.Truslow; fracture knees, hands and wrists -   . S/P CABG x 1 1996   CABG--LIMA-LAD for ostial LAD (not felt to be PCI amenable). EF NORMAL then; LIMA now atretic  . Shortness of breath dyspnea    with exertion  . Stroke [facility]) 11-2014   TIA   . Urinary frequency  Past Surgical History:  Procedure Laterality Date  . 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 APPENDECTOMY    . BUNIONECTOMY  07/2011   right foot  . CARDIAC CATHETERIZATION  2005   (Most recent CATH) - ostial LAD lesion 20-30% (down from 90% initially). Atretic LIMA. Minimal disease the RCA and Circumflex system.  [name] CARPAL TUNNEL RELEASE Left   . CATARACT EXTRACTION Bilateral   . CERVICAL SPINE SURGERY     plate 2008 Dr. Saintclair Halsted  . [place]   INCLUDED AN INTERNAL MAMMARY ARTERY TO THE LAD. EF WAS NORMAL  . INGUINAL HERNIA  REPAIR Right   . LAPAROSCOPIC APPENDECTOMY N/A 06/28/2016   Procedure: APPENDECTOMY LAPAROSCOPIC;  Surgeon: Leighton Ruff, MD;  Location: [facility];  Service: General;  Laterality: N/A;  . NM MYOVIEW LTD  March 2012   Fixed anteroseptal, apical and inferoapical defect with moderate size. Most likely scar. Mild subendocardial ischemia. EF 71% LOW RISK.   [name] SPLENECTOMY    . TRANSTHORACIC ECHOCARDIOGRAM  12/2014   [facility]: Normal LV size & function. EF 55-60%,   . vagina polyp    . VIDEO ASSISTED THORACOSCOPY (VATS)/WEDGE RESECTION Left 03/02/2015   Procedure: VIDEO ASSISTED THORACOSCOPY (VATS), MINI THORACOTOMY, LEFT UPPER LOBE WEDGE, TAKE DOWN OF INTERNAL MAMMARY LESIONS, PLACEMENT OF ON-Q PUMP;  Surgeon: Grace Isaac, MD;  Location: [place];  Service: Thoracic;  Laterality: Left;  [name] VIDEO BRONCHOSCOPY N/A 03/02/2015   Procedure: BRONCHOSCOPY;  Surgeon: Grace Isaac, MD;  Location: [place];  Service: Thoracic;  Laterality: N/A;  . VIDEO BRONCHOSCOPY WITH ENDOBRONCHIAL NAVIGATION N/A 10/08/2017   Procedure: VIDEO BRONCHOSCOPY WITH ENDOBRONCHIAL NAVIGATION WITH BIOPSIES OF LEFT UPPER LOBE AND LEFT LOWER LOBE;  Surgeon: Grace Isaac, MD;  Location: [facility];  Service: Thoracic;  Laterality: N/A;     Current Meds  Medication Sig  . acetaminophen (TYLENOL) 500 MG tablet Take 500 mg by mouth every 6 (six) hours as needed (pain).   [name] albuterol (VENTOLIN HFA) 108 (90 Base) MCG/ACT inhaler Inhale 2 puffs into the lungs every 6 (six) hours as needed for wheezing or shortness of breath.  [name] amLODipine (NORVASC) 5 MG tablet Take 1 tablet (5 mg total) by mouth daily.  [name] atorvastatin (LIPITOR) 80 MG tablet Take 1 tablet (80 mg total) by mouth daily.  . CHLORPHENIRAMINE-ACETAMINOPHEN PO Take 1 tablet by mouth 2 (two) times daily.  . Cholecalciferol (VITAMIN D3) 50 MCG (2000 UT) capsule Take 1 capsule (2,000 Units total) by mouth daily.  . clopidogrel (PLAVIX) 75 MG tablet Take 1 tablet (75 mg  total) by mouth daily.  . famotidine (PEPCID) 20 MG tablet One at bedtime  . gabapentin (NEURONTIN) 100 MG capsule Take 2 capsules (200 mg total) by mouth 3 (three) times daily. One three times daily  . leflunomide (ARAVA) 20 MG tablet Take 20 mg by mouth daily.   [name] levothyroxine (SYNTHROID) 88 MCG tablet Take 1 tablet (88 mcg total) by mouth daily before breakfast.  . loperamide (IMODIUM A-D) 2 MG tablet Take 2 mg by mouth 4 (four) times daily as needed for diarrhea or loose stools.  [name] losartan (COZAAR) 100 MG tablet Take 0.5 tablets (50 mg total) by mouth daily.  . metoprolol tartrate (LOPRESSOR) 50 MG tablet Take 1.5 tablets (75 mg total) by mouth 2 (two) times daily.  . pantoprazole (PROTONIX) 40 MG tablet  Take 1 tablet (40 mg total) by mouth daily. Take 30-60 min before first meal of the day  . psyllium (METAMUCIL) 58.6 % packet Take 1 packet by mouth daily.  [name] triamcinolone (NASACORT ALLERGY 24HR) 55 MCG/ACT AERO nasal inhaler Place 1 spray into the nose daily.      Allergies:   Aspirin, Codeine, and Nitroglycerin   Social History   Tobacco Use  . Smoking status: Former Smoker    Quit date: 12/04/1958    Years since quitting: 60.6  . Smokeless tobacco: Never Used  Substance Use Topics  . Alcohol use: Yes    Comment: occassionally  . Drug use: No     Family Hx: The patient's family history includes Cancer in her paternal uncle; Coronary artery disease in an other family member; Diabetes in her mother; Heart disease in her mother; Hyperlipidemia in her mother, sister, son, son, and son; Hypertension in her daughter, mother, sister, son, son, son, and another family member; Stroke in her father. There is no history of Asthma or Colon cancer.  ROS:   Please see the history of present illness.    All other systems reviewed and are negative.   Prior CV studies:   The following studies were reviewed today:  Myoview 2012 Echo nov 2020- normal LVF with severe LVH, grade 1  DD   Labs/Other Tests and Data Reviewed:    EKG:  An ECG dated 06/22/2019 was personally reviewed today and demonstrated:  NSR, HR 78  Recent Labs: 11/13/2018: ALT 28; TSH 2.57 02/21/2019: Hemoglobin 11.5; Platelets 386.0; Pro B Natriuretic peptide (BNP) 383.0 05/23/2019: BUN 33; Creatinine, Ser 1.46; Potassium 4.8; Sodium 140   Recent Lipid Panel Lab Results  Component Value Date/Time   CHOL 152 11/13/2018 11:25 AM   CHOL 146 04/25/2016 08:52 AM   TRIG 57.0 11/13/2018 11:25 AM   TRIG 93 04/25/2016 08:52 AM   HDL 72.50 11/13/2018 11:25 AM   HDL 60 04/25/2016 08:52 AM   CHOLHDL 2 11/13/2018 11:25 AM   LDLCALC 68 11/13/2018 11:25 AM   LDLCALC 68 04/25/2016 08:52 AM   LDLDIRECT 162.1 10/28/2009 08:57 AM    Wt Readings from Last 3 Encounters:  07/07/19 155 lb (70.3 kg)  07/03/19 151 lb (68.5 kg)  06/12/19 147 lb (66.7 kg)     Objective:    Vital Signs:  BP (!) 157/93   Pulse 60   Ht 5\' 3"  (1.6 m)   Wt 155 lb (70.3 kg)   SpO2 96%   BMI 27.46 kg/m    VITAL SIGNS:  reviewed  ASSESSMENT & PLAN:    CABG- H/o CABG x 1 in 1996- low risk Myoview 2012. No recent angina.  HTN- Normal LVF with severe LVH by echo Nov 2020.  Her PCP has adjusted her medications recently.  Granulomatous lung disease- Lt VATS 2016, LLL biopsy Jan 2020.  Followed by Dr Servando Snare and Dr Melvyn Novas  CRI- Stage 3- appears stable based on recent labs  RA- She is on [place] for this  HLD- On statin Rx- LDL 79 in July 2020  COVID-19 Education: The signs and symptoms of COVID-19 were discussed with the patient and how to seek care for testing (follow up with PCP or arrange E-visit).  The importance of social distancing was discussed today.  Time:   Today, I have spent 15 minutes with the patient with telehealth technology discussing the above problems.     Medication Adjustments/Labs and Tests Ordered: Current medicines are reviewed at length  with the patient today.  Concerns regarding medicines are  outlined above.   Tests Ordered: No orders of the defined types were placed in this encounter.   Medication Changes: No orders of the defined types were placed in this encounter.   Follow Up:  In Person Dr Ellyn Hack 6 months  Signed, Kerin Ransom, PA-C  07/07/2019 9:55 AM    [place]

## 2019-07-07 NOTE — Patient Instructions (Signed)
Medication Instructions:  Continue current medications  *If you need a refill on your cardiac medications before your next appointment, please call your pharmacy*   Lab Work: None Ordered  Testing/Procedures: None Ordered   Follow-Up: At [facility], you and your health needs are our priority.  As part of our continuing mission to provide you with exceptional heart care, we have created designated Provider Care Teams.  These Care Teams include your primary Cardiologist (physician) and Advanced Practice Providers (APPs -  Physician Assistants and Nurse Practitioners) who all work together to provide you with the care you need, when you need it.  We recommend signing up for the patient portal called "MyChart".  Sign up information is provided on this After Visit Summary.  MyChart is used to connect with patients for Virtual Visits (Telemedicine).  Patients are able to view lab/test results, encounter notes, upcoming appointments, etc.  Non-urgent messages can be sent to your provider as well.   To learn more about what you can do with MyChart, go to [email or web address].    Your next appointment:   6 month(s)  The format for your next appointment:   In Person  Provider:   You may see David Harding, MD or one of the following Advanced Practice Providers on your designated Care Team:    Rhonda Barrett, PA-C  Kathryn Lawrence, DNP, ANP  Cadence Furth, NP      

## 2019-07-08 DIAGNOSIS — M0579 Rheumatoid arthritis with rheumatoid factor of multiple sites without organ or systems involvement: Secondary | ICD-10-CM | POA: Diagnosis not present

## 2019-07-09 ENCOUNTER — Telehealth: Payer: Self-pay | Admitting: Internal Medicine

## 2019-07-09 NOTE — Telephone Encounter (Signed)
Called and spoke with [name] at [facility]. She was calling to clarify the instructions on the gabapentin RX. Advised her that based on the telephone note from 06/30/19, the RX should be for 2 capsules 3 times daily for a quantity of 540 capsules for a 90 day supply. She verbalized understanding and was able to update the RX.   Nothing further needed at time of call.

## 2019-07-17 ENCOUNTER — Other Ambulatory Visit: Payer: Self-pay

## 2019-07-17 ENCOUNTER — Ambulatory Visit: Payer: Medicare PPO | Admitting: Gastroenterology

## 2019-07-17 ENCOUNTER — Encounter: Payer: Self-pay | Admitting: Gastroenterology

## 2019-07-17 VITALS — BP 142/74 | HR 60 | Temp 98.2°F | Ht 63.0 in | Wt 154.0 lb

## 2019-07-17 DIAGNOSIS — R140 Abdominal distension (gaseous): Secondary | ICD-10-CM | POA: Diagnosis not present

## 2019-07-17 DIAGNOSIS — R151 Fecal smearing: Secondary | ICD-10-CM

## 2019-07-17 DIAGNOSIS — Z86010 Personal history of colonic polyps: Secondary | ICD-10-CM

## 2019-07-17 DIAGNOSIS — R194 Change in bowel habit: Secondary | ICD-10-CM

## 2019-07-17 MED ORDER — CITRUCEL PO POWD: 1.0000 | Freq: Every day | ORAL | Status: DC

## 2019-07-17 NOTE — Patient Instructions (Addendum)
If you are age 81 or older, your body mass index should be between 23-30. Your Body mass index is 27.28 kg/m. If this is out of the aforementioned range listed, please consider follow up with your Primary Care Provider.  If you are age 46 or younger, your body mass index should be between 19-25. Your Body mass index is 27.28 kg/m. If this is out of the aformentioned range listed, please consider follow up with your Primary Care Provider.   Please stop taking Metamucil.   Start Citrucel powder - daily.  We will refer you to [facility]. They will contact you to schedule an appointment.  Please let us know if you have not heard from them in 2 weeks so we can follow up.  Thank you for entrusting me with your care and for choosing [facility], Dr. [place] Cellar

## 2019-07-17 NOTE — Progress Notes (Signed)
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  I have not seen her since November 2017 at the time of her last colonoscopy.  At her last visit she was complaining of loose stools with periodic urgency and fecal incontinence.  Colonoscopy was performed in November 2017.  5 polyps were noted, 4 removed easily, one was invading the appendix which led to appendectomy.  Otherwise no inflammatory changes were noted, biopsies were negative for microscopic colitis.  I had recommended some Imodium at the time which she took and states her stools became more formed and she was happy with the regimen.  She has not been on Imodium in a while, her main complaint at this point is sense of incomplete evacuation.  She feels like she has the urge to have a bowel movement roughly 5-6 times a day but having a hard time getting out every piece of stool in her rectum.  Her stool appears formed, it is not loose.  She is using Metamucil daily and has been doing so for a while but has a lot of gas that bothers her.  She denies any abdominal pains.  She also has some fecal leakage at times and can drain small amounts of stool or mucus.  She states she was told in the past by her gynecologist that she has a rectocele.  Newer medications for her include gabapentin, Simponi for RA, and Prolia for osteoporosis.  She continues to take Plavix.  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  She again denies any postprandial abdominal pain or weight loss.  Prior workup: Colonoscopy 12/27/2010 - one small descending tubular adenoma, diverticulosis Colonoscopy 07/16/2000 - normal exam  Colonoscopy 03/29/2016 - The perianal and digital rectal examinations were normal. - A 5 mm polyp was found in the cecum. The polyp was sessile. The polyp was removed with a cold snare. Resection and  retrieval were complete. - A medium polyp was found in the appendiceal orifice, the borders could not be seen and appeared to be invading the appendix. The polyp was sessile. Biopsies were taken with a cold forceps for histology. - A 8 mm polyp was found in the ascending colon. The polyp was sessile. The polyp was removed with a cold snare. Resection and retrieval were complete. - A 5 mm polyp was found in the descending colon. The polyp was sessile. The polyp was removed with a cold snare. Resection and retrieval were complete. - A 4 mm polyp was found in the sigmoid colon. The polyp was sessile. The polyp was removed with a cold snare. Resection and retrieval were complete. - The terminal ileum appeared normal. Multiple medium-mouthed diverticula were found in the left colon. - Internal hemorrhoids were found during retroflexion. - The exam was otherwise without abnormality. No inflammatory changes were noted. - Biopsies for histology were taken with a cold forceps from the right colon and left colon for evaluation of microscopic colitis.    Past Medical History:  Diagnosis Date  . Abdominal aortic aneurysm (HCC)    REPAIRED IN 1996 BY [name]  AND HAS RECENTLY BEEN FOLLOWED BY [name]  . 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 Adenomatous colon polyp    tubular  . Anemia   .  CAD in native artery 1996, 2002, 2005    Status post CABG x1 with LIMA-LAD for ostial LAD 90% stenosis --> down to 50% in 2002 and 30% in 2005.;  Atretic LIMA; Myoview 06/2010: Fixed anteroseptal, apical and inferoapical defect with moderate size. Most likely scar. Mild subendocardial ischemia. EF 71% LOW RISK.   [name] Cervical disc disease    fracture  . Chronic kidney disease   . COPD (chronic obstructive pulmonary disease) ([place])   . Diverticulosis   . Hyperlipidemia   . Hypertension   . Hypothyroidism (acquired)    hypo  .  Myocardial infarction [facility]) Aug. 2016   TIA  . Polymyalgia rheumatica (HCC)    2011 Dr. Charlestine Night  . Rheumatoid arthritis ([place]) 2011   Dr.Truslow; fracture knees, hands and wrists -   . S/P CABG x 1 1996   CABG--LIMA-LAD for ostial LAD (not felt to be PCI amenable). EF NORMAL then; LIMA now atretic  . Shortness of breath dyspnea    with exertion  . Stroke [facility]) 11-2014   TIA   . Urinary frequency      Past Surgical History:  Procedure Laterality Date  . ABDOMINAL AORTIC ANEURYSM REPAIR  2409   Complicated by mesenteric artery stenosis and splenic artery infarction with acquired Asplenia  . APPENDECTOMY    . BUNIONECTOMY  07/2011   right foot  . CARDIAC CATHETERIZATION  2005   (Most recent CATH) - ostial LAD lesion 20-30% (down from 90% initially). Atretic LIMA. Minimal disease the RCA and Circumflex system.  [name] CARPAL TUNNEL RELEASE Left   . CATARACT EXTRACTION Bilateral   . CERVICAL SPINE SURGERY     plate 2008 Dr. Saintclair Halsted  . [place]   INCLUDED AN INTERNAL MAMMARY ARTERY TO THE LAD. EF WAS NORMAL  . INGUINAL HERNIA REPAIR Right   . LAPAROSCOPIC APPENDECTOMY N/A 06/28/2016   Procedure: APPENDECTOMY LAPAROSCOPIC;  Surgeon: Leighton Ruff, MD;  Location: [facility];  Service: General;  Laterality: N/A;  . NM MYOVIEW LTD  March 2012   Fixed anteroseptal, apical and inferoapical defect with moderate size. Most likely scar. Mild subendocardial ischemia. EF 71% LOW RISK.   [name] SPLENECTOMY    . TRANSTHORACIC ECHOCARDIOGRAM  12/2014   [facility]: Normal LV size & function. EF 55-60%,   . vagina polyp    . VIDEO ASSISTED THORACOSCOPY (VATS)/WEDGE RESECTION Left 03/02/2015   Procedure: VIDEO ASSISTED THORACOSCOPY (VATS), MINI THORACOTOMY, LEFT UPPER LOBE WEDGE, TAKE DOWN OF INTERNAL MAMMARY LESIONS, PLACEMENT OF ON-Q PUMP;  Surgeon: Grace Isaac, MD;  Location: [place];  Service: Thoracic;  Laterality: Left;  [name] VIDEO BRONCHOSCOPY N/A 03/02/2015   Procedure:  BRONCHOSCOPY;  Surgeon: Grace Isaac, MD;  Location: [place];  Service: Thoracic;  Laterality: N/A;  . VIDEO BRONCHOSCOPY WITH ENDOBRONCHIAL NAVIGATION N/A 10/08/2017   Procedure: VIDEO BRONCHOSCOPY WITH ENDOBRONCHIAL NAVIGATION WITH BIOPSIES OF LEFT UPPER LOBE AND LEFT LOWER LOBE;  Surgeon: Grace Isaac, MD;  Location: [facility];  Service: Thoracic;  Laterality: N/A;   Family History  Problem Relation Age of Onset  . Hypertension Mother   . Diabetes Mother   . Heart disease Mother   . Hyperlipidemia Mother   . Stroke Father   . Hyperlipidemia Sister   . Hypertension Sister   . Hypertension Daughter   . Cancer Paternal Uncle        Deceased from cancer not sure of site  . Hypertension Son   . Hyperlipidemia  Son   . Hyperlipidemia Son   . Hypertension Son   . Hyperlipidemia Son   . Hypertension Son   . Hypertension Other   . Coronary artery disease Other   . Asthma Neg Hx   . Colon cancer Neg Hx    Social History   Tobacco Use  . Smoking status: Former Smoker    Quit date: 12/04/1958    Years since quitting: 60.6  . Smokeless tobacco: Never Used  Substance Use Topics  . Alcohol use: Yes    Comment: occassionally  . Drug use: No   Current Outpatient Medications  Medication Sig Dispense Refill  . acetaminophen (TYLENOL) 500 MG tablet Take 500 mg by mouth every 6 (six) hours as needed (pain).     [name] albuterol (VENTOLIN HFA) 108 (90 Base) MCG/ACT inhaler Inhale 2 puffs into the lungs every 6 (six) hours as needed for wheezing or shortness of breath. 8 g 2  . amLODipine (NORVASC) 5 MG tablet Take 1 tablet (5 mg total) by mouth daily. 90 tablet 3  . atorvastatin (LIPITOR) 80 MG tablet Take 1 tablet (80 mg total) by mouth daily. 90 tablet 3  . cetirizine (ZYRTEC) 10 MG chewable tablet Chew 10 mg by mouth daily.    . Cholecalciferol (VITAMIN D3) 50 MCG (2000 UT) capsule Take 1 capsule (2,000 Units total) by mouth daily. 100 capsule 3  . clopidogrel (PLAVIX) 75 MG tablet Take 1  tablet (75 mg total) by mouth daily. 90 tablet 3  . famotidine (PEPCID) 20 MG tablet One at bedtime 90 tablet 2  . gabapentin (NEURONTIN) 100 MG capsule Take 2 capsules (200 mg total) by mouth 3 (three) times daily. One three times daily 540 capsule 1  . leflunomide (ARAVA) 20 MG tablet Take 20 mg by mouth daily.     [name] levothyroxine (SYNTHROID) 88 MCG tablet Take 1 tablet (88 mcg total) by mouth daily before breakfast. 90 tablet 3  . loperamide (IMODIUM A-D) 2 MG tablet Take 2 mg by mouth 4 (four) times daily as needed for diarrhea or loose stools.    [name] losartan (COZAAR) 100 MG tablet Take 0.5 tablets (50 mg total) by mouth daily. 90 tablet 3  . metoprolol tartrate (LOPRESSOR) 50 MG tablet Take 1.5 tablets (75 mg total) by mouth 2 (two) times daily. 180 tablet 3  . pantoprazole (PROTONIX) 40 MG tablet Take 1 tablet (40 mg total) by mouth daily. Take 30-60 min before first meal of the day 90 tablet 3  . psyllium (METAMUCIL) 58.6 % packet Take 1 packet by mouth daily.    [name] triamcinolone (NASACORT ALLERGY 24HR) 55 MCG/ACT AERO nasal inhaler Place 1 spray into the nose daily.      No current facility-administered medications for this visit.   Allergies  Allergen Reactions  . Aspirin Other (See Comments)    Hurts stomach  . Codeine Nausea And Vomiting  . Nitroglycerin Other (See Comments)    Heart rate drops     Review of Systems: All systems reviewed and negative except where noted in HPI.    CT CHEST WO CONTRAST  Result Date: 07/03/2019 CLINICAL DATA:  Follow-up pulmonary nodules. EXAM: CT CHEST WITHOUT CONTRAST TECHNIQUE: Multidetector CT imaging of the chest was performed following the standard protocol without IV contrast. COMPARISON:  01/03/2018 FINDINGS: Cardiovascular: Previous median sternotomy and CABG procedure. No pericardial effusion. Aortic atherosclerosis. Mediastinum/Nodes: No enlarged mediastinal or hilar adenopathy. The thyroid gland appears atrophic. The trachea appears  patent and is  midline. Normal appearance of the esophagus. Lungs/Pleura: Previous wedge resection identified within the left upper lobe. The index lung nodule near the suture line in the left upper lobe measures 0.6 cm, image 20/8. Previously 0.7 cm. The previous left lower lobe solid nodule now appears predominantly cystic measuring 1.3 x 1.3 cm, image 118/8. Previously this measured 2.4 x 1.7 cm. There is a new subpleural nodule within the periphery of the right lower lobe which measures 0.8 x 0.5 cm, image 113/8. Upper Abdomen: There is hypertrophy of the lateral segment of left lobe of liver. The contour the liver appears slightly irregular. No acute abnormality noted within the upper abdomen. Cysts within the upper pole of left kidney are identified and incompletely characterized without IV contrast. Musculoskeletal: No chest wall mass or suspicious bone lesions identified. IMPRESSION: 1. The previous solid-appearing nodule within the left lower lobe has decreased in size in the interval and now appears cystic. 2. Left upper lobe lung nodule is slightly decreased in size from previous exam. 3. There is a new lung nodule within the periphery of the right lower lobe which has a mean diameter of 7 mm. This is indeterminate. A small neoplasm cannot be excluded 4.  Aortic Atherosclerosis (ICD10-I70.0). Electronically Signed   By: Kerby Moors M.D.   On: 07/03/2019 14:12    Physical Exam: BP (!) 142/74   Pulse 60   Temp 98.2 F (36.8 C)   Ht 5\' 3"  (1.6 m)   Wt 154 lb (69.9 kg)   BMI 27.28 kg/m  Constitutional: Pleasant,well-developed, female in no acute distress. HEENT: Normocephalic and atraumatic. Conjunctivae are normal. No scleral icterus. Neck supple.  Cardiovascular: Normal rate, regular rhythm.  Pulmonary/chest: Effort normal and breath sounds normal. No wheezing, rales or rhonchi. Abdominal: Soft, nondistended, nontender.  There are no masses palpable. No hepatomegaly. DRE - low resting  tone, low squeeze, ? rectocele with decent, CMA Tia Alert as standby Extremities: no edema Lymphadenopathy: No cervical adenopathy noted. Neurological: Alert and oriented to person place and time. Skin: Skin is warm and dry. No rashes noted. Psychiatric: Normal mood and affect. Behavior is normal.   ASSESSMENT AND PLAN: 79 year old female here to reestablish care for the following:  Altered bowel habits / fecal soiling / bloating - no longer having loose stools but sense of incomplete evacuation, suspected rectocele.  On DRE her tone is low with low squeeze, although no obvious dyssynergia but with suspected rectocele.  I discussed options with her.  If she truly has a rectocele causing this, best management will be surgery but given her age and comorbidities would want to avoid that.  I do think she has some low tone which is contributing to the incontinence/soiling.  We will start conservatively and refer her to pelvic floor PT to see if she gets much benefit with this.  I will also switch her Metamucil to Citrucel in light of the bloating she is having and see if that helps, as metamucil could be causing this.  If her symptoms persist, can consider formal defacography to evaluate for size of rectocele, and have her follow-up with her gynecologist.  She agreed with the plan and will keep you posted on how she is doing moving forward.  History of colon polyps - given her age and comorbidities we will forego further surveillance exams, no high risk lesions on her last exam and she is now status post appendectomy (for appendiceal orifice polyp).  [place] Cellar, MD [facility]  CC: Plotnikov,  [name], MD

## 2019-07-18 ENCOUNTER — Telehealth: Payer: Self-pay | Admitting: Internal Medicine

## 2019-07-18 NOTE — Telephone Encounter (Signed)
Called and spoke with pt who stated she has been on gabapentin at last OV with Dr. Melvyn Novas 03/20/19. Pt stated she started noticing swelling in ankles and feet 1 week ago and pt wonders if the swelling is coming from the gabapentin as that was the most recent med pt was placed on.  Dr. Melvyn Novas, please advise on this for pt with what you recommend.   Instructions from last OV 03/20/19  Increase Lopressor 50 mg twice daily   Gabapentin 100 mg three times a day - call if throat discomfort is not improving to adjust up to maximum of 300 mg three times a day   Please schedule a follow up visit in 6  months but call sooner if needed

## 2019-07-18 NOTE — Telephone Encounter (Signed)
It is possible,  Would stop for 2 weeks to see if that's it and then return to regroup or consider eval by ENT at that point if throat is still bothering her.

## 2019-07-18 NOTE — Telephone Encounter (Signed)
Spoke with pt, aware of recs.  Scheduled to see TP 4/1 at 11:30 to follow up on ankle swelling.  Nothing further needed at this time- will close encounter.

## 2019-07-22 ENCOUNTER — Other Ambulatory Visit: Payer: Self-pay

## 2019-07-22 DIAGNOSIS — I728 Aneurysm of other specified arteries: Secondary | ICD-10-CM

## 2019-07-31 ENCOUNTER — Ambulatory Visit: Payer: Medicare PPO | Admitting: Adult Health

## 2019-08-01 DIAGNOSIS — I2510 Atherosclerotic heart disease of native coronary artery without angina pectoris: Secondary | ICD-10-CM | POA: Diagnosis not present

## 2019-08-01 DIAGNOSIS — E785 Hyperlipidemia, unspecified: Secondary | ICD-10-CM | POA: Diagnosis not present

## 2019-08-01 DIAGNOSIS — Z7902 Long term (current) use of antithrombotics/antiplatelets: Secondary | ICD-10-CM | POA: Diagnosis not present

## 2019-08-01 DIAGNOSIS — E039 Hypothyroidism, unspecified: Secondary | ICD-10-CM | POA: Diagnosis not present

## 2019-08-01 DIAGNOSIS — Z79899 Other long term (current) drug therapy: Secondary | ICD-10-CM | POA: Diagnosis not present

## 2019-08-01 DIAGNOSIS — Z6826 Body mass index (BMI) 26.0-26.9, adult: Secondary | ICD-10-CM | POA: Diagnosis not present

## 2019-08-01 DIAGNOSIS — K219 Gastro-esophageal reflux disease without esophagitis: Secondary | ICD-10-CM | POA: Diagnosis not present

## 2019-08-01 DIAGNOSIS — Z809 Family history of malignant neoplasm, unspecified: Secondary | ICD-10-CM | POA: Diagnosis not present

## 2019-08-01 DIAGNOSIS — Z823 Family history of stroke: Secondary | ICD-10-CM | POA: Diagnosis not present

## 2019-08-01 DIAGNOSIS — E663 Overweight: Secondary | ICD-10-CM | POA: Diagnosis not present

## 2019-08-01 DIAGNOSIS — I10 Essential (primary) hypertension: Secondary | ICD-10-CM | POA: Diagnosis not present

## 2019-08-01 DIAGNOSIS — K5900 Constipation, unspecified: Secondary | ICD-10-CM | POA: Diagnosis not present

## 2019-08-01 DIAGNOSIS — J45909 Unspecified asthma, uncomplicated: Secondary | ICD-10-CM | POA: Diagnosis not present

## 2019-08-01 DIAGNOSIS — Z8249 Family history of ischemic heart disease and other diseases of the circulatory system: Secondary | ICD-10-CM | POA: Diagnosis not present

## 2019-08-01 DIAGNOSIS — Z8673 Personal history of transient ischemic attack (TIA), and cerebral infarction without residual deficits: Secondary | ICD-10-CM | POA: Diagnosis not present

## 2019-08-01 DIAGNOSIS — M1990 Unspecified osteoarthritis, unspecified site: Secondary | ICD-10-CM | POA: Diagnosis not present

## 2019-08-01 DIAGNOSIS — Z87891 Personal history of nicotine dependence: Secondary | ICD-10-CM | POA: Diagnosis not present

## 2019-08-01 DIAGNOSIS — Z833 Family history of diabetes mellitus: Secondary | ICD-10-CM | POA: Diagnosis not present

## 2019-08-04 ENCOUNTER — Ambulatory Visit: Payer: Medicare PPO | Admitting: Surgery

## 2019-08-05 ENCOUNTER — Encounter: Payer: Self-pay | Admitting: Physical Therapy

## 2019-08-05 ENCOUNTER — Ambulatory Visit: Payer: Medicare PPO | Attending: Gastroenterology | Admitting: Physical Therapy

## 2019-08-05 ENCOUNTER — Other Ambulatory Visit: Payer: Self-pay

## 2019-08-05 DIAGNOSIS — M6281 Muscle weakness (generalized): Secondary | ICD-10-CM | POA: Diagnosis not present

## 2019-08-05 DIAGNOSIS — R279 Unspecified lack of coordination: Secondary | ICD-10-CM | POA: Insufficient documentation

## 2019-08-05 NOTE — Patient Instructions (Signed)
Toileting Techniques for Bowel Movements    An Evacuation/Defecation Plan   Here are the 4 basic points:  1. Lean forward enough for your elbows to rest on your knees 2. Support your feet on the floor or use a low stool if your feet don't touch the floor  3. Push out your belly as if you have swallowed a beach ball--you should feel a widening of your waist. "Belly Big, Belly Hard" 4. Open and relax your pelvic floor muscles, rather than tightening around the anus  While you are sitting on the toilet pay attention to the following areas: . Jaw and mouth position- relaxed not clenched . Angle of your hips - leaning slightly forward . Whether your feet touch the ground or not - should be flat and supported . Arm placement - rest against your thighs . Spine position - flat back . Waist . Breathing - exhale as you push (like blowing up a balloon or try using other sounds such as ahhhh, shhhhh, ohhhh or grrrrrrr) . Belly - hard and tight as you push . Anus (opening of the anal canal) - relaxed and open as you push . Anus - Tighten and lift pulling the muscle back in after you are done or if taking a break  If you are not successful after 10-15 minutes, try again later.  Avoid negative self-talk about your toileting experience.   Read this for more details and ask your PT if you need suggestions for adjustments or limitations:  1) Sitting on the toilet  a) Make sure your feet are supported - flat on the floor or step stool b) Many people find it effective to lean forward or raise their knees.  Propping your feet on a step stool (squatty potty is a brand name) can help the muscles around the anus to relax  c) When you lean forward, place your forearms on your thighs for support  2) Relaxing a) Breathe deeply and slowly in through your nose and out through your mouth. b) To become aware of how to relax your muscles, contracting and releasing muscles can be helpful.  Pull your pelvic floor  muscles in tightly by using the image of holding back gas, or closing around the anus (visualize making a circle smaller) and lifting the anus up and in.  Then release the muscles and your anus should drop down and feel open. Repeat 5 times ending with the feeling of relaxation. c) Keep your pelvic floor muscles relaxed; let your belly bulge out. d) The digestive tract starts at the mouth and ends at the anal opening, so be sure to relax both ends of the tube.  Place your tongue on the roof of your mouth with your teeth separated.  This helps relax your mouth and will help to relax the anus at the same time.  3) Emptying (defecation) a) Keep your pelvic floor and sphincter relaxed, then bulge your anal muscles.  Make the anal opening wide.  b) Stick your belly out as if you have swallowed a beach ball. c) Make your belly wall hard using your belly muscles while continuing to breathe. Doing this makes it easier to open your anus. d) Breath out and give a grunt (or try using other sounds such as ahhhh, shhhhh, ohhhh or grrrrrrr). e)  Can also try to act as if you are blowing up a balloon as you push  4) Finishing a) As you finish your bowel movement, pull the pelvic floor muscles  up and in.  This will leave your anus in the proper place rather than remaining pushed out and down. If you leave your anus pushed out and down, it will start to feel as though that is normal and give you incorrect signals about needing to have a bowel movement.

## 2019-08-06 NOTE — Therapy (Signed)
[facility] [address], [place], [place], [address] Phone: [phone]   Fax:  [phone]  Physical Therapy Evaluation  Patient Details  Name: [name] MRN: [id number] Date of Birth: 1940-11-27 Referring Provider (PT): Armbruster, Carlota Raspberry, MD   Encounter Date: 08/05/2019  PT End of Session - 08/05/19 1349    Visit Number  1    Date for PT Re-Evaluation  09/30/19    PT Start Time  1233    PT Stop Time  1315    PT Time Calculation (min)  42 min    Activity Tolerance  Patient tolerated treatment well    Behavior During Therapy  [facility] for tasks assessed/performed       Past Medical History:  Diagnosis Date  . Abdominal aortic aneurysm (HCC)    REPAIRED IN 1996 BY [name]  AND HAS RECENTLY BEEN FOLLOWED BY [name]  . 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 Adenomatous colon polyp    tubular  . Anemia   . CAD in native artery 1996, 2002, 2005    Status post CABG x1 with LIMA-LAD for ostial LAD 90% stenosis --> down to 50% in 2002 and 30% in 2005.;  Atretic LIMA; Myoview 06/2010: Fixed anteroseptal, apical and inferoapical defect with moderate size. Most likely scar. Mild subendocardial ischemia. EF 71% LOW RISK.   [name] Cervical disc disease    fracture  . Chronic kidney disease   . COPD (chronic obstructive pulmonary disease) ([place])   . Diverticulosis   . Hyperlipidemia   . Hypertension   . Hypothyroidism (acquired)    hypo  . Myocardial infarction [facility]) Aug. 2016   TIA  . Polymyalgia rheumatica (HCC)    2011 Dr. Charlestine Night  . Rheumatoid arthritis ([place]) 2011   Dr.Truslow; fracture knees, hands and wrists -   . S/P CABG x 1 1996   CABG--LIMA-LAD for ostial LAD (not felt to be PCI amenable). EF NORMAL then; LIMA now atretic  . Shortness of breath dyspnea    with exertion  . Stroke [facility]) 11-2014   TIA   . Urinary frequency      Past Surgical History:  Procedure Laterality Date  . ABDOMINAL AORTIC ANEURYSM REPAIR  4235   Complicated by mesenteric artery stenosis and splenic artery infarction with acquired Asplenia  . APPENDECTOMY    . BUNIONECTOMY  07/2011   right foot  . CARDIAC CATHETERIZATION  2005   (Most recent CATH) - ostial LAD lesion 20-30% (down from 90% initially). Atretic LIMA. Minimal disease the RCA and Circumflex system.  [name] CARPAL TUNNEL RELEASE Left   . CATARACT EXTRACTION Bilateral   . CERVICAL SPINE SURGERY     plate 2008 Dr. Saintclair Halsted  . [place]   INCLUDED AN INTERNAL MAMMARY ARTERY TO THE LAD. EF WAS NORMAL  . INGUINAL HERNIA REPAIR Right   . LAPAROSCOPIC APPENDECTOMY N/A 06/28/2016   Procedure: APPENDECTOMY LAPAROSCOPIC;  Surgeon: Leighton Ruff, MD;  Location: [facility];  Service: General;  Laterality: N/A;  . NM MYOVIEW LTD  March 2012   Fixed anteroseptal, apical and inferoapical defect with moderate size. Most likely scar. Mild subendocardial ischemia. EF 71% LOW RISK.   [name] SPLENECTOMY    . TRANSTHORACIC ECHOCARDIOGRAM  12/2014   [facility]: Normal LV size & function. EF 55-60%,   . vagina polyp    .  VIDEO ASSISTED THORACOSCOPY (VATS)/WEDGE RESECTION Left 03/02/2015   Procedure: VIDEO ASSISTED THORACOSCOPY (VATS), MINI THORACOTOMY, LEFT UPPER LOBE WEDGE, TAKE DOWN OF INTERNAL MAMMARY LESIONS, PLACEMENT OF ON-Q PUMP;  Surgeon: Grace Isaac, MD;  Location: [place];  Service: Thoracic;  Laterality: Left;  [name] VIDEO BRONCHOSCOPY N/A 03/02/2015   Procedure: BRONCHOSCOPY;  Surgeon: Grace Isaac, MD;  Location: [place];  Service: Thoracic;  Laterality: N/A;  . VIDEO BRONCHOSCOPY WITH ENDOBRONCHIAL NAVIGATION N/A 10/08/2017   Procedure: VIDEO BRONCHOSCOPY WITH ENDOBRONCHIAL NAVIGATION WITH BIOPSIES OF LEFT UPPER LOBE AND LEFT LOWER LOBE;  Surgeon: Grace Isaac, MD;  Location: [place];  Service: Thoracic;  Laterality: N/A;    There were no vitals filed for  this visit.   Subjective Assessment - 08/05/19 1235    Subjective  Pt states she has had leakage for years.  States feces can be very slippery.  Every time I void a little comes out.  Hemorrhoids get irritated because I go so often.  I use baby wipes.  I have a little mucous come out over time. I don't feel like I am emptying everything and my body tries to occasionally flush something out and I have full incontinence. I wear 1-2 pads/day or 4-5 on bad day.    Patient Stated Goals  feel like I am emptying and not have to bear down as much    Currently in Pain?  Yes    Pain Score  5     Pain Location  Back    Pain Orientation  Mid;Lower    Pain Descriptors / Indicators  Pressure    Pain Type  Chronic pain    Pain Onset  More than a month ago    Pain Frequency  Intermittent    Pain Relieving Factors  nothing    Multiple Pain Sites  No         [facility] PT Assessment - 08/06/19 0001      Assessment   Medical Diagnosis  R19.4 (ICD-10-CM) - Altered bowel habits; R15.1 (ICD-10-CM) - Fecal soiling; R14.0 (ICD-10-CM) - Bloating; Z86.010 (ICD-10-CM) - History of colon polyps    Referring Provider (PT)  Armbruster, Carlota Raspberry, MD    Onset Date/Surgical Date  --   years     Precautions   Precautions  None      Balance Screen   Has the patient fallen in the past 6 months  Yes    How many times?  fainted1x    Has the patient had a decrease in activity level because of a fear of falling?   No    Is the patient reluctant to leave their home because of a fear of falling?   No      Home Film/video editor residence   continuing care facility - [facility]   Living Arrangements  Spouse/significant other      Prior Function   Level of Independence  Independent      Cognition   Overall Cognitive Status  Within Functional Limits for tasks assessed      Posture/Postural Control   Posture/Postural Control  Postural limitations    Postural Limitations  Rounded  Shoulders;Forward head;Increased thoracic kyphosis      ROM / Strength   AROM / PROM / Strength  Strength      Strength   Overall Strength Comments  LE grossly 4/5 bilat hip strength      Flexibility   Soft Tissue Assessment /  Muscle Length  yes    Hamstrings  80% bilateral      Ambulation/Gait   Gait Pattern  Within Functional Limits                Objective measurements completed on examination: See above findings.    Pelvic Floor Special Questions - 08/06/19 0001    Urinary Leakage  Yes    Fecal incontinence  Yes    Falling out feeling (prolapse)  No    Skin Integrity  Erthema    Perineal Body/Introitus   Gaping    Pelvic Floor Internal Exam  pt informed and identity confirmed for internal assessment    Exam Type  Rectal    Palpation  no tone in external sphincter; elevated tone internal sphincter and pubrectalis but unable to hold against any resistance    Strength  Flicker    Strength # of seconds  1    Tone  lack of coordination and consistantly bulges abdomen and pelvic floor with contraction               PT Education - 08/06/19 1239    Education Details  toileting techniques    Person(s) Educated  Patient    Methods  Explanation;Demonstration;Verbal cues;Handout    Comprehension  Verbalized understanding;Returned demonstration       PT Short Term Goals - 08/06/19 1718      PT SHORT TERM GOAL #1   Title  25% reduction in leakage    Time  4    Period  Weeks    Status  New    Target Date  09/02/19      PT SHORT TERM GOAL #2   Title  ind with initial HEP    Time  4    Period  Weeks    Status  New    Target Date  09/02/19      PT SHORT TERM GOAL #3   Title  able to correctly engage and activate pelvic floor without bearing down    Time  4    Period  Weeks    Status  New    Target Date  09/02/19        PT Long Term Goals - 08/06/19 1714      PT LONG TERM GOAL #1   Title  Pt will report she is having less frequent BM due to  able to greater pelvic floor strength and endurance    Time  8    Period  Weeks    Status  New    Target Date  09/30/19      PT LONG TERM GOAL #2   Title  ind with advanced HEP    Time  8    Period  Weeks    Status  New    Target Date  09/30/19      PT LONG TERM GOAL #3   Title  Pt will report 50% reduction in leakage    Time  8    Period  Weeks    Status  New    Target Date  09/30/19      PT LONG TERM GOAL #4   Title  Pt will demonstrate ability to contract pelvic floor with at least 2/5 MMT and hold for 10 seconds without holding her breath in order to perform functional tasks without leakage    Time  8    Period  Weeks    Status  New    Target  Date  09/30/19             Plan - 08/05/19 1351    Clinical Impression Statement  Pt presents to clinic due to fecal incontinence that has been ongoing for years.  She has difficulty emptying and reports frequent BM throughout the day.  Pt also has urinary incontinence but is not as concerned about that. Pt dmeosntrates hip weakness.  Posture deficits as mentioned above.  Rectal assessment was completed and patient demosntrates very weak external sphincter.  High tone of internal sphincter and puborectalis but both also weakn.  pt unable to hold against any resistance and not getting any lifting when contracting. Pt has poor coordination and bears down instead of contracting and lifting.  Pt will benefit from skilled PT to improve strength and coordination, strength and endurance so she can return to maximum function without leakage    Personal Factors and Comorbidities  Time since onset of injury/illness/exacerbation;Comorbidity 1    Comorbidities  history of polyps    Examination-Activity Limitations  Continence    Stability/Clinical Decision Making  Evolving/Moderate complexity    Clinical Decision Making  Moderate    Rehab Potential  Good    PT Frequency  1x / week    PT Duration  12 weeks       Patient will benefit from  skilled therapeutic intervention in order to improve the following deficits and impairments:  Impaired tone, Decreased strength, Postural dysfunction, Decreased coordination, Decreased range of motion, Increased muscle spasms  Visit Diagnosis: Muscle weakness (generalized)  Unspecified lack of coordination     Problem List Patient Active Problem List   Diagnosis Date Noted  . Hyperkalemia 05/15/2019  . Osteoporosis 02/26/2018  . Colon polyp 06/28/2016  . COPD GOLD I 05/28/2016  . Upper airway cough syndrome 05/22/2016  . Acute respiratory failure with hypoxemia ([place]) 04/16/2016  . Acute respiratory failure with hypoxia ([place]) 04/15/2016  . Acute bronchitis 04/03/2016  . Wrist pain, right 12/14/2015  . Lung mass 03/02/2015  . Pre-operative cardiovascular examination 02/21/2015  . DOE (dyspnea on exertion) 02/16/2015  . Solitary pulmonary nodule 02/15/2015  . TIA (transient ischemic attack) 12/31/2014  . Diarrhea 11/26/2014  . Well adult exam 11/26/2014  . Seasonal allergies 12/31/2013  . Splenic artery aneurysm ([place]) 04/07/2013  . Dyslipidemia, goal LDL below 70   . Pernicious anemia 09/04/2012  . 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 Long term (current) use of anticoagulants 11/08/2010  . Rheumatoid arthritis ([place]) 06/23/2010  . Pain in joint 11/26/2009  . 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 Hypothyroidism 12/08/2006  . MACULAR DEGENERATION 12/08/2006  . Essential hypertension 12/08/2006  . Diaphragmatic hernia 12/08/2006  . CRI (chronic renal insufficiency), stage 3 (moderate) 12/08/2006  . ASPLENIA 12/08/2006  . 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  . 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) 12/22/1994    Class: History of  . 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; now atretic and significantly improved LAD lesion without intervention 12/22/1994    Class: History of    Jule Ser, PT 08/06/2019, 5:27 PM  [facility] [address], [place], [place], [address] Phone: [phone]   Fax:  [phone]  Name: [name] MRN: [id number] Date of Birth: 1941/04/05

## 2019-08-07 DIAGNOSIS — E663 Overweight: Secondary | ICD-10-CM | POA: Diagnosis not present

## 2019-08-07 DIAGNOSIS — M0579 Rheumatoid arthritis with rheumatoid factor of multiple sites without organ or systems involvement: Secondary | ICD-10-CM | POA: Diagnosis not present

## 2019-08-07 DIAGNOSIS — M7062 Trochanteric bursitis, left hip: Secondary | ICD-10-CM | POA: Diagnosis not present

## 2019-08-07 DIAGNOSIS — M150 Primary generalized (osteo)arthritis: Secondary | ICD-10-CM | POA: Diagnosis not present

## 2019-08-07 DIAGNOSIS — Z6827 Body mass index (BMI) 27.0-27.9, adult: Secondary | ICD-10-CM | POA: Diagnosis not present

## 2019-08-07 DIAGNOSIS — M545 Low back pain: Secondary | ICD-10-CM | POA: Diagnosis not present

## 2019-08-08 ENCOUNTER — Other Ambulatory Visit: Payer: Self-pay

## 2019-08-08 ENCOUNTER — Encounter: Payer: Self-pay | Admitting: Adult Health

## 2019-08-08 ENCOUNTER — Ambulatory Visit: Payer: Medicare PPO | Admitting: Adult Health

## 2019-08-08 VITALS — BP 142/62 | HR 68 | Temp 97.3°F | Ht 63.0 in | Wt 152.6 lb

## 2019-08-08 DIAGNOSIS — M25473 Effusion, unspecified ankle: Secondary | ICD-10-CM | POA: Diagnosis not present

## 2019-08-08 DIAGNOSIS — R058 Other specified cough: Secondary | ICD-10-CM

## 2019-08-08 DIAGNOSIS — R0602 Shortness of breath: Secondary | ICD-10-CM

## 2019-08-08 DIAGNOSIS — J449 Chronic obstructive pulmonary disease, unspecified: Secondary | ICD-10-CM

## 2019-08-08 DIAGNOSIS — R05 Cough: Secondary | ICD-10-CM | POA: Diagnosis not present

## 2019-08-08 MED ORDER — HYDROCHLOROTHIAZIDE 12.5 MG PO TABS: 12.5000 mg | ORAL_TABLET | Freq: Every day | ORAL | 3 refills | Status: DC

## 2019-08-08 NOTE — Assessment & Plan Note (Signed)
Lower extremity edema bilaterally suspect is multifactorial with underlying venous insufficiency mild diastolic dysfunction and possible side effect of addition of amlodipine. Patient does have difficulty control hypertension blood pressure is on the upper limits of normal pillar we will add in HCTZ 12.5 mg.  We will check be met and BNP today. Advised to follow-up with primary care provider for ongoing blood pressure management

## 2019-08-08 NOTE — Assessment & Plan Note (Signed)
Cough seems to be under better control.  Continue on trigger prevention.  Was unable to tolerate gabapentin. May use Delsym as needed

## 2019-08-08 NOTE — Assessment & Plan Note (Addendum)
Appears to be stable   No increased symptom burden off of Symbicort.  Continue on current regimen

## 2019-08-08 NOTE — Progress Notes (Signed)
_0  ID: [name], female    DOB: 06-23-1940, 79 y.o.   MRN: [id number]  Chief Complaint  Patient presents with  . Follow-up    COPD     Referring provider: Plotnikov, [name], MD  HPI: 79 year old female former smoker followed for gold 1 COPD, chronic cough, pulmonary nodules RA on Arava   TEST/EVENTS :  PFT's  02/19/15   FEV1 1.78 (90 % ) ratio 66  p 12 % improvement from saba p nothing  prior to study with DLCO  71 % corrects to 78 % for alv volume  And classical curvature  - Spirometry 06/19/2016  FEV1 1.65  (84%)  Ratio 69  With no prior rx  - PFT's  01/14/2018  FEV1 1.68 (90 % ) ratio 68  p 18 % improvement from saba p nothing  prior to study with DLCO  84 % corrects to 90  % for alv volume  - - - 02/06/2019   Walked RA x one lap =  approx 250 ft - stopped due to  Sob with sats 95% avg pace  - 02/21/2019   Walked RA  2 laps @  approx 243f each @ slow pace  stopped due to tired more than sob / sats 90%     08/08/2019 Follow up : COPD , Chronic Cough , pulmonary nodules Patient presents for a follow-up visit.  Patient was last seen in November 2020.  She has underlying COPD and chronic cough.  At that time was having increased coughing.  She was started on gabapentin.  Patient states she took this for short period of time but started noticed that she was having ankle swelling.  She stopped this.  Ankle swelling got slightly better but has not totally resolved.  She does feel that since last visit her cough is somewhat better it is more manageable and is not causing her significant issues. Was previously on Symbicort no perceived benefit, no worse off of this .   CT chest July 03, 2019 showed previous wedge resection of the left upper lobe, previous left lower lobe solid nodule appears predominantly cystic measuring 1.3 x 1.3 cm in size).  New subpleural nodule right lower lobe measuring 0.8 x 0.5 cm She is followed by thoracic surgery with plans for a follow-up CT chest in 6  months.   Patient says her ankle swelling has been going on for the last few months.  She denies any orthopnea, chest pain, calf pain.  Worse in the afternoon hours. Was recently started on Norvasc 2 months ago.  Has difficulty control hypertension.  2D echo showed preserved EF with mild diastolic dysfunction, mildly elevated pulmonary artery pressure in March 10, 2019 Allergies  Allergen Reactions  . Aspirin Other (See Comments)    Hurts stomach  . Codeine Nausea And Vomiting  . Nitroglycerin Other (See Comments)    Heart rate drops    Immunization History  Administered Date(s) Administered  . Influenza Whole 05/02/2001, 01/27/2008, 01/12/2010, 12/31/2010, 01/25/2012  . Influenza, High Dose Seasonal PF 01/14/2014, 02/13/2015, 12/21/2016, 01/09/2018, 12/29/2018  . Influenza,inj,Quad PF,6+ Mos 12/23/2015  . Influenza-Unspecified 02/03/2013, 01/13/2014, 02/13/2015  . Meningococcal Polysaccharide 03/21/2012  . Moderna SARS-COVID-2 Vaccination 06/02/2019, 06/30/2019  . PPD Test 11/18/2013  . Pneumococcal Conjugate-13 03/20/2013  . Pneumococcal Polysaccharide-23 05/02/2000, 06/23/2010, 02/26/2018  . Td 05/01/2006  . Tdap 06/01/2016  . Zoster 01/17/2006  . Zoster Recombinat (Shingrix) 05/16/2017, 07/16/2017    Past Medical History:  Diagnosis Date  .  Abdominal aortic aneurysm (HCC)    REPAIRED IN 1996 BY [name]  AND HAS RECENTLY BEEN FOLLOWED BY [name]  . 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 Adenomatous colon polyp    tubular  . Anemia   . CAD in native artery 1996, 2002, 2005    Status post CABG x1 with LIMA-LAD for ostial LAD 90% stenosis --> down to 50% in 2002 and 30% in 2005.;  Atretic LIMA; Myoview 06/2010: Fixed anteroseptal, apical and inferoapical defect with moderate size. Most likely scar. Mild subendocardial ischemia. EF 71% LOW RISK.   [name] Cervical disc disease    fracture  . Chronic  kidney disease   . COPD (chronic obstructive pulmonary disease) ([place])   . Diverticulosis   . Hyperlipidemia   . Hypertension   . Hypothyroidism (acquired)    hypo  . Myocardial infarction [facility]) Aug. 2016   TIA  . Polymyalgia rheumatica (HCC)    2011 Dr. Charlestine Night  . Rheumatoid arthritis ([place]) 2011   Dr.Truslow; fracture knees, hands and wrists -   . S/P CABG x 1 1996   CABG--LIMA-LAD for ostial LAD (not felt to be PCI amenable). EF NORMAL then; LIMA now atretic  . Shortness of breath dyspnea    with exertion  . Stroke [facility]) 11-2014   TIA   . Urinary frequency     Tobacco History: Social History   Tobacco Use  Smoking Status Former Smoker  . Quit date: 12/04/1958  . Years since quitting: 60.7  Smokeless Tobacco Never Used   Counseling given: Not Answered   Outpatient Medications Prior to Visit  Medication Sig Dispense Refill  . acetaminophen (TYLENOL) 500 MG tablet Take 500 mg by mouth every 6 (six) hours as needed (pain).     [name] albuterol (VENTOLIN HFA) 108 (90 Base) MCG/ACT inhaler Inhale 2 puffs into the lungs every 6 (six) hours as needed for wheezing or shortness of breath. 8 g 2  . amLODipine (NORVASC) 5 MG tablet Take 1 tablet (5 mg total) by mouth daily. 90 tablet 3  . atorvastatin (LIPITOR) 80 MG tablet Take 1 tablet (80 mg total) by mouth daily. 90 tablet 3  . cetirizine (ZYRTEC) 10 MG chewable tablet Chew 10 mg by mouth daily.    . Cholecalciferol (VITAMIN D3) 50 MCG (2000 UT) capsule Take 1 capsule (2,000 Units total) by mouth daily. 100 capsule 3  . clopidogrel (PLAVIX) 75 MG tablet Take 1 tablet (75 mg total) by mouth daily. 90 tablet 3  . famotidine (PEPCID) 20 MG tablet One at bedtime 90 tablet 2  . leflunomide (ARAVA) 20 MG tablet Take 20 mg by mouth daily.     [name] levothyroxine (SYNTHROID) 88 MCG tablet Take 1 tablet (88 mcg total) by mouth daily before breakfast. 90 tablet 3  . loperamide (IMODIUM A-D) 2 MG tablet Take 2 mg by mouth 4 (four) times daily as  needed for diarrhea or loose stools.    [name] losartan (COZAAR) 100 MG tablet Take 0.5 tablets (50 mg total) by mouth daily. 90 tablet 3  . methylcellulose (CITRUCEL) oral powder Take 1 packet by mouth daily.    . metoprolol tartrate (LOPRESSOR) 50 MG tablet Take 1.5 tablets (75 mg total) by mouth 2 (two) times daily. 180 tablet 3  . pantoprazole (PROTONIX) 40 MG tablet Take 1 tablet (40 mg total) by mouth daily. Take 30-60 min before first meal of  the day 90 tablet 3  . triamcinolone (NASACORT ALLERGY 24HR) 55 MCG/ACT AERO nasal inhaler Place 1 spray into the nose daily.     [name] gabapentin (NEURONTIN) 100 MG capsule Take 2 capsules (200 mg total) by mouth 3 (three) times daily. One three times daily 540 capsule 1   No facility-administered medications prior to visit.     Review of Systems:   Constitutional:   No  weight loss, night sweats,  Fevers, chills,  +fatigue, or  lassitude.  HEENT:   No headaches,  Difficulty swallowing,  Tooth/dental problems, or  Sore throat,                No sneezing, itching, ear ache, nasal congestion, post nasal drip,   CV:  No chest pain,  Orthopnea, PND,anasarca, dizziness, palpitations, syncope.   GI  No heartburn, indigestion, abdominal pain, nausea, vomiting, diarrhea, change in bowel habits, loss of appetite, bloody stools.   Resp: No shortness of breath with exertion or at rest.  No excess mucus, no productive cough,  No non-productive cough,  No coughing up of blood.  No change in color of mucus.  No wheezing.  No chest wall deformity  Skin: no rash or lesions.  GU: no dysuria, change in color of urine, no urgency or frequency.  No flank pain, no hematuria   MS:  No joint pain or swelling.  No decreased range of motion.  No back pain.    Physical Exam  BP (!) 142/62 (BP Location: Left Arm, Cuff Size: Normal)   Pulse 68   Temp (!) 97.3 F (36.3 C) (Temporal)   Ht _0  (1.6 m)   Wt 152 lb 9.6 oz (69.2 kg)   SpO2 93%   BMI 27.03 kg/m    GEN: A/Ox3; pleasant , NAD, elderly   HEENT:  [place]/AT,  NOSE-clear, THROAT-clear, no lesions, no postnasal drip or exudate noted.   NECK:  Supple w/ fair ROM; no JVD; normal carotid impulses w/o bruits; no thyromegaly or nodules palpated; no lymphadenopathy.    RESP  Clear  P & A; w/o, wheezes/ rales/ or rhonchi. no accessory muscle use, no dullness to percussion  CARD:  RRR, no m/r/g, 1+ peripheral edema, pulses intact, no cyanosis or clubbing.  GI:   Soft & nt; nml bowel sounds; no organomegaly or masses detected.   Musco: Warm bil, no deformities or joint swelling noted.   Neuro: alert, no focal deficits noted.    Skin: Warm, no lesions or rashes    Lab Results:   BNP No results found for: BNP   Imaging: No results found.    PFT Results Latest Ref Rng & Units 01/14/2018 02/19/2015  FVC-Pre L 2.29 2.56  FVC-Predicted Pre % 91 97  FVC-Post L 2.47 2.68  FVC-Predicted Post % 98 101  Pre FEV1/FVC % % 62 62  Post FEV1/FCV % % 68 66  FEV1-Pre L 1.42 1.58  FEV1-Predicted Pre % 76 80  FEV1-Post L 1.68 1.78  DLCO UNC% % 84 71  DLCO COR %Predicted % 90 78  TLC L 5.45 5.41  TLC % Predicted % 111 110  RV % Predicted % 136 123    No results found for: NITRICOXIDE      Assessment & Plan:   COPD GOLD I Appears to be stable   No increased symptom burden off of Symbicort.  Continue on current regimen  Upper airway cough syndrome Cough seems to be under better control.  Continue on  trigger prevention.  Was unable to tolerate gabapentin. May use Delsym as needed  Ankle swelling Lower extremity edema bilaterally suspect is multifactorial with underlying venous insufficiency mild diastolic dysfunction and possible side effect of addition of amlodipine. Patient does have difficulty control hypertension blood pressure is on the upper limits of normal pillar we will add in HCTZ 12.5 mg.  We will check be met and BNP today. Advised to follow-up with primary care provider for  ongoing blood pressure management     Rexene Edison, NP 08/08/2019

## 2019-08-08 NOTE — Patient Instructions (Addendum)
Labs today  Begin Hydrochlorothiazide 12.5mg  (2 tabs first day ) then 1 daily  Eat banana daily.  Continue on zyrtec and nasacort daily  Continue on Protonix daily  GERD diet .  Delsym 2 tsp Twice daily  As needed  Cough.  Ventolin inhaler As needed   Follow up for CT later this year as planned Follow up with Dr. Melvyn Novas  Next month as planned  Follow up with PCP for Hypertension and Leg swelling  Please contact office for sooner follow up if symptoms do not improve or worsen or seek emergency care

## 2019-08-09 LAB — BASIC METABOLIC PANEL
BUN/Creatinine Ratio: 27 (calc) — ABNORMAL HIGH (ref 6–22)
BUN: 49 mg/dL — ABNORMAL HIGH (ref 7–25)
CO2: 24 mmol/L (ref 20–32)
Calcium: 10 mg/dL (ref 8.6–10.4)
Chloride: 106 mmol/L (ref 98–110)
Creat: 1.82 mg/dL — ABNORMAL HIGH (ref 0.60–0.88)
Glucose, Bld: 126 mg/dL — ABNORMAL HIGH (ref 65–99)
Potassium: 4.4 mmol/L (ref 3.5–5.3)
Sodium: 141 mmol/L (ref 135–146)

## 2019-08-09 LAB — BRAIN NATRIURETIC PEPTIDE: Brain Natriuretic Peptide: 445 pg/mL — ABNORMAL HIGH (ref <100)

## 2019-08-13 ENCOUNTER — Encounter: Payer: Self-pay | Admitting: Physical Therapy

## 2019-08-13 ENCOUNTER — Ambulatory Visit: Payer: Medicare PPO | Admitting: Physical Therapy

## 2019-08-13 ENCOUNTER — Other Ambulatory Visit: Payer: Self-pay

## 2019-08-13 DIAGNOSIS — M6281 Muscle weakness (generalized): Secondary | ICD-10-CM | POA: Diagnosis not present

## 2019-08-13 DIAGNOSIS — R279 Unspecified lack of coordination: Secondary | ICD-10-CM | POA: Diagnosis not present

## 2019-08-13 NOTE — Patient Instructions (Signed)
Access Code: [email or web address] URL: [email or web address] Date: 08/13/2019 Prepared by: Jari Favre  Exercises Supine Lower Trunk Rotation - 1 x daily - 7 x weekly - 10 reps - 3 sets Supine Upper Trunk and Cervical Rotation with Opposite Lower Trunk Rotation - 1 x daily - 7 x weekly - 10 reps - 1 sets Thoracic Extension Mobilization with Noodle - 1 x daily - 7 x weekly - 1 sets - 10 reps Supine Hip Adductor Squeeze with Small Ball - 3 x daily - 7 x weekly - 1 sets - 10 reps - 2 sec hold Seated Thoracic Flexion and Rotation with Arms Crossed - 1 x daily - 7 x weekly - 10 reps - 1 sets - 5 sec hold Seated Hamstring Stretch - 1 x daily - 7 x weekly - 3 reps - 1 sets - 30 sec hold

## 2019-08-13 NOTE — Therapy (Signed)
[facility] [address] [place], [place], [address] Phone: [phone]   Fax:  [phone]  Physical Therapy Treatment  Patient Details  Name: BRISA AUTH MRN: [id number] Date of Birth: Oct 18, 1940 Referring Provider (PT): Armbruster, Carlota Raspberry, MD   Encounter Date: 08/13/2019  PT End of Session - 08/13/19 1529    Visit Number  2    Date for PT Re-Evaluation  09/30/19    PT Start Time  0459    PT Stop Time  9774    PT Time Calculation (min)  46 min    Activity Tolerance  Patient tolerated treatment well    Behavior During Therapy  [facility] for tasks assessed/performed       Past Medical History:  Diagnosis Date  . Abdominal aortic aneurysm (HCC)    REPAIRED IN 1996 BY [name]  AND HAS RECENTLY BEEN FOLLOWED BY [name]  . 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 Adenomatous colon polyp    tubular  . Anemia   . CAD in native artery 1996, 2002, 2005    Status post CABG x1 with LIMA-LAD for ostial LAD 90% stenosis --> down to 50% in 2002 and 30% in 2005.;  Atretic LIMA; Myoview 06/2010: Fixed anteroseptal, apical and inferoapical defect with moderate size. Most likely scar. Mild subendocardial ischemia. EF 71% LOW RISK.   [name] Cervical disc disease    fracture  . Chronic kidney disease   . COPD (chronic obstructive pulmonary disease) ([place])   . Diverticulosis   . Hyperlipidemia   . Hypertension   . Hypothyroidism (acquired)    hypo  . Myocardial infarction [facility]) Aug. 2016   TIA  . Polymyalgia rheumatica (HCC)    2011 Dr. Charlestine Night  . Rheumatoid arthritis ([place]) 2011   Dr.Truslow; fracture knees, hands and wrists -   . S/P CABG x 1 1996   CABG--LIMA-LAD for ostial LAD (not felt to be PCI amenable). EF NORMAL then; LIMA now atretic  . Shortness of breath dyspnea    with exertion  . Stroke [facility]) 11-2014   TIA   . Urinary frequency      Past Surgical History:  Procedure Laterality Date  . ABDOMINAL AORTIC ANEURYSM REPAIR  1423   Complicated by mesenteric artery stenosis and splenic artery infarction with acquired Asplenia  . APPENDECTOMY    . BUNIONECTOMY  07/2011   right foot  . CARDIAC CATHETERIZATION  2005   (Most recent CATH) - ostial LAD lesion 20-30% (down from 90% initially). Atretic LIMA. Minimal disease the RCA and Circumflex system.  [name] CARPAL TUNNEL RELEASE Left   . CATARACT EXTRACTION Bilateral   . CERVICAL SPINE SURGERY     plate 2008 Dr. Saintclair Halsted  . [place]   INCLUDED AN INTERNAL MAMMARY ARTERY TO THE LAD. EF WAS NORMAL  . INGUINAL HERNIA REPAIR Right   . LAPAROSCOPIC APPENDECTOMY N/A 06/28/2016   Procedure: APPENDECTOMY LAPAROSCOPIC;  Surgeon: Leighton Ruff, MD;  Location: [facility];  Service: General;  Laterality: N/A;  . NM MYOVIEW LTD  March 2012   Fixed anteroseptal, apical and inferoapical defect with moderate size. Most likely scar. Mild subendocardial ischemia. EF 71% LOW RISK.   [name] SPLENECTOMY    . TRANSTHORACIC ECHOCARDIOGRAM  12/2014   [facility]: Normal LV size & function. EF 55-60%,   . vagina polyp    .  VIDEO ASSISTED THORACOSCOPY (VATS)/WEDGE RESECTION Left 03/02/2015   Procedure: VIDEO ASSISTED THORACOSCOPY (VATS), MINI THORACOTOMY, LEFT UPPER LOBE WEDGE, TAKE DOWN OF INTERNAL MAMMARY LESIONS, PLACEMENT OF ON-Q PUMP;  Surgeon: Grace Isaac, MD;  Location: [place];  Service: Thoracic;  Laterality: Left;  [name] VIDEO BRONCHOSCOPY N/A 03/02/2015   Procedure: BRONCHOSCOPY;  Surgeon: Grace Isaac, MD;  Location: [place];  Service: Thoracic;  Laterality: N/A;  . VIDEO BRONCHOSCOPY WITH ENDOBRONCHIAL NAVIGATION N/A 10/08/2017   Procedure: VIDEO BRONCHOSCOPY WITH ENDOBRONCHIAL NAVIGATION WITH BIOPSIES OF LEFT UPPER LOBE AND LEFT LOWER LOBE;  Surgeon: Grace Isaac, MD;  Location: [place];  Service: Thoracic;  Laterality: N/A;    There were no vitals filed for  this visit.  Subjective Assessment - 08/13/19 1532    Subjective  I had a BM 2x yesterday.  Then I felt fine and ate out and had an accident when I got to the bathroom. Pt states    Patient Stated Goals  feel like I am emptying and not have to bear down as much    Currently in Pain?  No/denies                       [facility] Adult PT Treatment/Exercise - 08/13/19 0001      Neuro Re-ed    Neuro Re-ed Details   breathing with stretches; breathing with enaged pelvic floor - exhale with lift      Exercises   Exercises  Lumbar      Lumbar Exercises: Stretches   Active Hamstring Stretch  Right;Left;3 reps;20 seconds   supine, standing, sitting   Lower Trunk Rotation  3 reps;20 seconds   supine   Other Lumbar Stretch Exercise  thoracic ext with towel roll      Lumbar Exercises: Seated   Other Seated Lumbar Exercises  thoracic rotation and sidebending      Lumbar Exercises: Supine   AB Set Limitations  kegel sitting, standing, supine - posture corrections given to access pelvic floor more thoroughly    Other Supine Lumbar Exercises  ball squeeze - TC to lift; VC to activate at 50% - 10x 2 sec 5 sec rest             PT Education - 08/13/19 1626    Education Details  Access Code: [email or web address]    Person(s) Educated  Patient    Methods  Explanation;Demonstration;Tactile cues;Verbal cues;Handout    Comprehension  Verbalized understanding;Returned demonstration       PT Short Term Goals - 08/06/19 1718      PT SHORT TERM GOAL #1   Title  25% reduction in leakage    Time  4    Period  Weeks    Status  New    Target Date  09/02/19      PT SHORT TERM GOAL #2   Title  ind with initial HEP    Time  4    Period  Weeks    Status  New    Target Date  09/02/19      PT SHORT TERM GOAL #3   Title  able to correctly engage and activate pelvic floor without bearing down    Time  4    Period  Weeks    Status  New    Target Date  09/02/19        PT Long Term Goals -  08/06/19 1714      PT LONG TERM GOAL #  1   Title  Pt will report she is having less frequent BM due to able to greater pelvic floor strength and endurance    Time  8    Period  Weeks    Status  New    Target Date  09/30/19      PT LONG TERM GOAL #2   Title  ind with advanced HEP    Time  8    Period  Weeks    Status  New    Target Date  09/30/19      PT LONG TERM GOAL #3   Title  Pt will report 50% reduction in leakage    Time  8    Period  Weeks    Status  New    Target Date  09/30/19      PT LONG TERM GOAL #4   Title  Pt will demonstrate ability to contract pelvic floor with at least 2/5 MMT and hold for 10 seconds without holding her breath in order to perform functional tasks without leakage    Time  8    Period  Weeks    Status  New    Target Date  09/30/19            Plan - 08/13/19 1626    Clinical Impression Statement  Pt demonstrates posture abnormalities due to scoliosis.  She is more restricted on Rt thoracic and has increased weight on her Rt side.  Pt was educated in sitting posture and performed stretches with breathing to improved postural balance.  Pt was given TC outisde of clothing for pelvic activation in supine.  Did better using ball but still unable to hold contraction >2 sec.  Pt will benefit from skilled PT to progress, no goals met at this time of first follow up visit.    PT Treatment/Interventions  ADLs/Self Care Home Management;Biofeedback;Cryotherapy;Electrical Stimulation;Manual techniques;Neuromuscular re-education;Therapeutic exercise;Therapeutic activities;Patient/family education;Dry needling;Taping;Moist Heat;Passive range of motion    PT Next Visit Plan  internal STM and tactile cues to work on lift and bulge; rectal or vaginal okay    PT Home Exercise Plan  Access Code: [email or web address]    Consulted and Agree with Plan of Care  Patient       Patient will benefit from skilled therapeutic intervention in order to improve the following deficits  and impairments:  Impaired tone, Decreased strength, Postural dysfunction, Decreased coordination, Decreased range of motion, Increased muscle spasms  Visit Diagnosis: Muscle weakness (generalized)  Unspecified lack of coordination     Problem List Patient Active Problem List   Diagnosis Date Noted  . Ankle swelling 08/08/2019  . Hyperkalemia 05/15/2019  . Osteoporosis 02/26/2018  . Colon polyp 06/28/2016  . COPD GOLD I 05/28/2016  . Upper airway cough syndrome 05/22/2016  . Acute respiratory failure with hypoxemia ([place]) 04/16/2016  . Acute respiratory failure with hypoxia ([place]) 04/15/2016  . Acute bronchitis 04/03/2016  . Wrist pain, right 12/14/2015  . Lung mass 03/02/2015  . Pre-operative cardiovascular examination 02/21/2015  . DOE (dyspnea on exertion) 02/16/2015  . Solitary pulmonary nodule 02/15/2015  . TIA (transient ischemic attack) 12/31/2014  . Diarrhea 11/26/2014  . Well adult exam 11/26/2014  . Seasonal allergies 12/31/2013  . Splenic artery aneurysm ([place]) 04/07/2013  . Dyslipidemia, goal LDL below 70   . Pernicious anemia 09/04/2012  . 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 Long term (current) use of anticoagulants 11/08/2010  . Rheumatoid arthritis ([place]) 06/23/2010  . Pain in joint 11/26/2009  .  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 Hypothyroidism 12/08/2006  . MACULAR DEGENERATION 12/08/2006  . Essential hypertension 12/08/2006  . Diaphragmatic hernia 12/08/2006  . CRI (chronic renal insufficiency), stage 3 (moderate) 12/08/2006  . ASPLENIA 12/08/2006  . 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  . 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) 12/22/1994    Class: History of  . 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; now atretic and significantly improved LAD lesion without intervention 12/22/1994    Class: History of    Jule Ser, PT 08/13/2019, 4:46 PM  [facility] [address] [place], [place], [address] Phone: [phone]   Fax:  [phone]  Name: JENNAVECIA SCHWIER MRN: [id number] Date of Birth: 08/12/1940

## 2019-08-14 DIAGNOSIS — R2689 Other abnormalities of gait and mobility: Secondary | ICD-10-CM | POA: Diagnosis not present

## 2019-08-14 DIAGNOSIS — M7062 Trochanteric bursitis, left hip: Secondary | ICD-10-CM | POA: Diagnosis not present

## 2019-08-14 DIAGNOSIS — M545 Low back pain: Secondary | ICD-10-CM | POA: Diagnosis not present

## 2019-08-19 DIAGNOSIS — M545 Low back pain: Secondary | ICD-10-CM | POA: Diagnosis not present

## 2019-08-19 DIAGNOSIS — M7062 Trochanteric bursitis, left hip: Secondary | ICD-10-CM | POA: Diagnosis not present

## 2019-08-19 DIAGNOSIS — R2689 Other abnormalities of gait and mobility: Secondary | ICD-10-CM | POA: Diagnosis not present

## 2019-08-21 ENCOUNTER — Inpatient Hospital Stay: Admission: RE | Admit: 2019-08-21 | Payer: Medicare PPO | Source: Ambulatory Visit

## 2019-08-21 ENCOUNTER — Other Ambulatory Visit: Payer: Self-pay

## 2019-08-21 DIAGNOSIS — I728 Aneurysm of other specified arteries: Secondary | ICD-10-CM

## 2019-08-22 ENCOUNTER — Encounter: Payer: Self-pay | Admitting: Physical Therapy

## 2019-08-22 ENCOUNTER — Other Ambulatory Visit: Payer: Self-pay

## 2019-08-22 ENCOUNTER — Ambulatory Visit: Payer: Medicare PPO | Admitting: Physical Therapy

## 2019-08-22 DIAGNOSIS — M6281 Muscle weakness (generalized): Secondary | ICD-10-CM | POA: Diagnosis not present

## 2019-08-22 DIAGNOSIS — R279 Unspecified lack of coordination: Secondary | ICD-10-CM | POA: Diagnosis not present

## 2019-08-22 NOTE — Therapy (Signed)
[facility] [address] [place], [place], [address] Phone: [phone]   Fax:  [phone]  Physical Therapy Treatment  Patient Details  Name: JOSAPHINE SHIMAMOTO MRN: [id number] Date of Birth: 08/22/1940 Referring Provider (PT): Armbruster, Carlota Raspberry, MD   Encounter Date: 08/22/2019  PT End of Session - 08/22/19 1158    Visit Number  3    Date for PT Re-Evaluation  09/30/19    PT Start Time  0933    PT Stop Time  1601    PT Time Calculation (min)  41 min    Activity Tolerance  Patient tolerated treatment well    Behavior During Therapy  [facility] for tasks assessed/performed       Past Medical History:  Diagnosis Date  . Abdominal aortic aneurysm (HCC)    REPAIRED IN 1996 BY [name]  AND HAS RECENTLY BEEN FOLLOWED BY [name]  . 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 Adenomatous colon polyp    tubular  . Anemia   . CAD in native artery 1996, 2002, 2005    Status post CABG x1 with LIMA-LAD for ostial LAD 90% stenosis --> down to 50% in 2002 and 30% in 2005.;  Atretic LIMA; Myoview 06/2010: Fixed anteroseptal, apical and inferoapical defect with moderate size. Most likely scar. Mild subendocardial ischemia. EF 71% LOW RISK.   [name] Cervical disc disease    fracture  . Chronic kidney disease   . COPD (chronic obstructive pulmonary disease) ([place])   . Diverticulosis   . Hyperlipidemia   . Hypertension   . Hypothyroidism (acquired)    hypo  . Myocardial infarction [facility]) Aug. 2016   TIA  . Polymyalgia rheumatica (HCC)    2011 Dr. Charlestine Night  . Rheumatoid arthritis ([place]) 2011   Dr.Truslow; fracture knees, hands and wrists -   . S/P CABG x 1 1996   CABG--LIMA-LAD for ostial LAD (not felt to be PCI amenable). EF NORMAL then; LIMA now atretic  . Shortness of breath dyspnea    with exertion  . Stroke [facility]) 11-2014   TIA   . Urinary frequency      Past Surgical History:  Procedure Laterality Date  . ABDOMINAL AORTIC ANEURYSM REPAIR  0932   Complicated by mesenteric artery stenosis and splenic artery infarction with acquired Asplenia  . APPENDECTOMY    . BUNIONECTOMY  07/2011   right foot  . CARDIAC CATHETERIZATION  2005   (Most recent CATH) - ostial LAD lesion 20-30% (down from 90% initially). Atretic LIMA. Minimal disease the RCA and Circumflex system.  [name] CARPAL TUNNEL RELEASE Left   . CATARACT EXTRACTION Bilateral   . CERVICAL SPINE SURGERY     plate 2008 Dr. Saintclair Halsted  . [place]   INCLUDED AN INTERNAL MAMMARY ARTERY TO THE LAD. EF WAS NORMAL  . INGUINAL HERNIA REPAIR Right   . LAPAROSCOPIC APPENDECTOMY N/A 06/28/2016   Procedure: APPENDECTOMY LAPAROSCOPIC;  Surgeon: Leighton Ruff, MD;  [facility];  Service: General;  Laterality: N/A;  . NM MYOVIEW LTD  March 2012   Fixed anteroseptal, apical and inferoapical defect with moderate size. Most likely scar. Mild subendocardial ischemia. EF 71% LOW RISK.   [name] SPLENECTOMY    . TRANSTHORACIC ECHOCARDIOGRAM  12/2014   [facility]: Normal LV size & function. EF 55-60%,   . vagina polyp    .  VIDEO ASSISTED THORACOSCOPY (VATS)/WEDGE RESECTION Left 03/02/2015   Procedure: VIDEO ASSISTED THORACOSCOPY (VATS), MINI THORACOTOMY, LEFT UPPER LOBE WEDGE, TAKE DOWN OF INTERNAL MAMMARY LESIONS, PLACEMENT OF ON-Q PUMP;  Surgeon: Grace Isaac, MD;  Location: [place];  Service: Thoracic;  Laterality: Left;  [name] VIDEO BRONCHOSCOPY N/A 03/02/2015   Procedure: BRONCHOSCOPY;  Surgeon: Grace Isaac, MD;  Location: [place];  Service: Thoracic;  Laterality: N/A;  . VIDEO BRONCHOSCOPY WITH ENDOBRONCHIAL NAVIGATION N/A 10/08/2017   Procedure: VIDEO BRONCHOSCOPY WITH ENDOBRONCHIAL NAVIGATION WITH BIOPSIES OF LEFT UPPER LOBE AND LEFT LOWER LOBE;  Surgeon: Grace Isaac, MD;  Location: [place];  Service: Thoracic;  Laterality: N/A;    There were no vitals filed for  this visit.  Subjective Assessment - 08/22/19 1207    Subjective  I had two really bad days with diarrhea.  It is frustrating and it doesn't feel like anything is changing yet.    Patient Stated Goals  feel like I am emptying and not have to bear down as much    Currently in Pain?  No/denies                    Pelvic Floor Special Questions - 08/22/19 0001    Pelvic Floor Internal Exam  pt informed and identity confirmed for internal assessment for tactile cues with contracting and lifting        [facility] Adult PT Treatment/Exercise - 08/22/19 0001      Neuro Re-ed    Neuro Re-ed Details   breathing with TrA activation and tactile cues to collapse ribcage      Lumbar Exercises: Supine   AB Set Limitations  engaging TrA with exhale and needs cues to keep from reverse breathing and sucking in with diaphragm    Other Supine Lumbar Exercises  ball squeeze - TC to lift; VC to activate pelvic floor with exhale             PT Education - 08/22/19 1157    Education Details  Access Code: [email or web address] added breathing with TrA contraction    Person(s) Educated  Patient    Methods  Explanation;Demonstration;Tactile cues;Verbal cues;Handout    Comprehension  Verbalized understanding;Returned demonstration       PT Short Term Goals - 08/06/19 1718      PT SHORT TERM GOAL #1   Title  25% reduction in leakage    Time  4    Period  Weeks    Status  New    Target Date  09/02/19      PT SHORT TERM GOAL #2   Title  ind with initial HEP    Time  4    Period  Weeks    Status  New    Target Date  09/02/19      PT SHORT TERM GOAL #3   Title  able to correctly engage and activate pelvic floor without bearing down    Time  4    Period  Weeks    Status  New    Target Date  09/02/19        PT Long Term Goals - 08/06/19 1714      PT LONG TERM GOAL #1   Title  Pt will report she is having less frequent BM due to able to greater pelvic floor strength and endurance    Time   8    Period  Weeks    Status  New    Target  Date  09/30/19      PT LONG TERM GOAL #2   Title  ind with advanced HEP    Time  8    Period  Weeks    Status  New    Target Date  09/30/19      PT LONG TERM GOAL #3   Title  Pt will report 50% reduction in leakage    Time  8    Period  Weeks    Status  New    Target Date  09/30/19      PT LONG TERM GOAL #4   Title  Pt will demonstrate ability to contract pelvic floor with at least 2/5 MMT and hold for 10 seconds without holding her breath in order to perform functional tasks without leakage    Time  8    Period  Weeks    Status  New    Target Date  09/30/19            Plan - 08/22/19 1158    Clinical Impression Statement  Pt did well with bulging pelvic floor, but when she attempts to contract and lift she still bulges just not as much.  Today's session emphasized correct breathing technique as she contracted and lifted much better when her TrA was engaged and exhaling.  Pt needs a lot of verbal and tactile cues for correct breathing technique    PT Treatment/Interventions  ADLs/Self Care Home Management;Biofeedback;Cryotherapy;Electrical Stimulation;Manual techniques;Neuromuscular re-education;Therapeutic exercise;Therapeutic activities;Patient/family education;Dry needling;Taping;Moist Heat;Passive range of motion    PT Next Visit Plan  f/u on breathing, use sheet around ribcage for tactile cue; tactile cues to work on lifting pelvic floor    PT Home Exercise Plan  Access Code: [email or web address]    Consulted and Agree with Plan of Care  Patient       Patient will benefit from skilled therapeutic intervention in order to improve the following deficits and impairments:  Impaired tone, Decreased strength, Postural dysfunction, Decreased coordination, Decreased range of motion, Increased muscle spasms  Visit Diagnosis: Muscle weakness (generalized)  Unspecified lack of coordination     Problem List Patient Active Problem List    Diagnosis Date Noted  . Ankle swelling 08/08/2019  . Hyperkalemia 05/15/2019  . Osteoporosis 02/26/2018  . Colon polyp 06/28/2016  . COPD GOLD I 05/28/2016  . Upper airway cough syndrome 05/22/2016  . Acute respiratory failure with hypoxemia ([place]) 04/16/2016  . Acute respiratory failure with hypoxia ([place]) 04/15/2016  . Acute bronchitis 04/03/2016  . Wrist pain, right 12/14/2015  . Lung mass 03/02/2015  . Pre-operative cardiovascular examination 02/21/2015  . DOE (dyspnea on exertion) 02/16/2015  . Solitary pulmonary nodule 02/15/2015  . TIA (transient ischemic attack) 12/31/2014  . Diarrhea 11/26/2014  . Well adult exam 11/26/2014  . Seasonal allergies 12/31/2013  . Splenic artery aneurysm ([place]) 04/07/2013  . Dyslipidemia, goal LDL below 70   . Pernicious anemia 09/04/2012  . 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 Long term (current) use of anticoagulants 11/08/2010  . Rheumatoid arthritis ([place]) 06/23/2010  . Pain in joint 11/26/2009  . 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 Hypothyroidism 12/08/2006  . MACULAR DEGENERATION 12/08/2006  . Essential hypertension 12/08/2006  . Diaphragmatic hernia 12/08/2006  . CRI (chronic renal insufficiency), stage 3 (moderate) 12/08/2006  . ASPLENIA 12/08/2006  . 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  . 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) 12/22/1994    Class: History of  . S/P CABG x 1: LIMA-LAD for ostial  LAD lesion; now atretic and significantly improved LAD lesion without intervention 12/22/1994    Class: History of    Jule Ser, PT 08/22/2019, 12:11 PM  [place] [facility] [address] [place], [place], [address] Phone: [phone]   Fax:  [phone]  Name: DESTINEE TABER MRN: [id number] Date of Birth: [birth year]

## 2019-08-26 DIAGNOSIS — M545 Low back pain: Secondary | ICD-10-CM | POA: Diagnosis not present

## 2019-08-26 DIAGNOSIS — M7062 Trochanteric bursitis, left hip: Secondary | ICD-10-CM | POA: Diagnosis not present

## 2019-08-26 DIAGNOSIS — R2689 Other abnormalities of gait and mobility: Secondary | ICD-10-CM | POA: Diagnosis not present

## 2019-08-27 ENCOUNTER — Other Ambulatory Visit: Payer: Self-pay

## 2019-08-27 ENCOUNTER — Ambulatory Visit (HOSPITAL_COMMUNITY)
Admission: RE | Admit: 2019-08-27 | Discharge: 2019-08-27 | Disposition: A | Discharge status: Home / Self Care | Payer: Medicare PPO | Source: Ambulatory Visit | Attending: Surgery | Admitting: Surgery

## 2019-08-27 DIAGNOSIS — Z9081 Acquired absence of spleen: Secondary | ICD-10-CM | POA: Diagnosis not present

## 2019-08-27 DIAGNOSIS — K551 Chronic vascular disorders of intestine: Secondary | ICD-10-CM | POA: Insufficient documentation

## 2019-08-27 DIAGNOSIS — I728 Aneurysm of other specified arteries: Secondary | ICD-10-CM | POA: Diagnosis not present

## 2019-08-27 DIAGNOSIS — I774 Celiac artery compression syndrome: Secondary | ICD-10-CM | POA: Insufficient documentation

## 2019-08-27 DIAGNOSIS — I700 Atherosclerosis of aorta: Secondary | ICD-10-CM | POA: Insufficient documentation

## 2019-08-27 MED ORDER — GADOBUTROL 1 MMOL/ML IV SOLN
7.0000 mL | Freq: Once | INTRAVENOUS | Status: AC | PRN
  Administered 2019-08-27: 7 mL via INTRAVENOUS

## 2019-08-28 ENCOUNTER — Ambulatory Visit: Payer: Medicare PPO | Admitting: Physical Therapy

## 2019-08-28 ENCOUNTER — Other Ambulatory Visit: Payer: Self-pay

## 2019-08-28 ENCOUNTER — Encounter: Payer: Self-pay | Admitting: Physical Therapy

## 2019-08-28 DIAGNOSIS — M6281 Muscle weakness (generalized): Secondary | ICD-10-CM | POA: Diagnosis not present

## 2019-08-28 DIAGNOSIS — R279 Unspecified lack of coordination: Secondary | ICD-10-CM

## 2019-08-28 NOTE — Therapy (Signed)
[facility] [address] [place], [place], [address] Phone: [phone]   Fax:  [phone]  Physical Therapy Treatment  Patient Details  Name: [name] MRN: [id number] Date of Birth: [birth year] Referring Provider (PT): Armbruster, Carlota Raspberry, MD   Encounter Date: 08/28/2019  PT End of Session - 08/28/19 1306    Visit Number  4    Date for PT Re-Evaluation  09/30/19    PT Start Time  1224    PT Stop Time  1303    PT Time Calculation (min)  39 min    Activity Tolerance  Patient tolerated treatment well    Behavior During Therapy  [facility] for tasks assessed/performed       Past Medical History:  Diagnosis Date  . Abdominal aortic aneurysm (HCC)    REPAIRED IN 1996 BY [name]  AND HAS RECENTLY BEEN FOLLOWED BY [name]  . 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 Adenomatous colon polyp    tubular  . Anemia   . CAD in native artery 1996, 2002, 2005    Status post CABG x1 with LIMA-LAD for ostial LAD 90% stenosis --> down to 50% in 2002 and 30% in 2005.;  Atretic LIMA; Myoview 06/2010: Fixed anteroseptal, apical and inferoapical defect with moderate size. Most likely scar. Mild subendocardial ischemia. EF 71% LOW RISK.   [name] Cervical disc disease    fracture  . Chronic kidney disease   . COPD (chronic obstructive pulmonary disease) ([place])   . Diverticulosis   . Hyperlipidemia   . Hypertension   . Hypothyroidism (acquired)    hypo  . Myocardial infarction [facility]) Aug. 2016   TIA  . Polymyalgia rheumatica (HCC)    2011 Dr. Charlestine Night  . Rheumatoid arthritis ([place]) 2011   Dr.Truslow; fracture knees, hands and wrists -   . S/P CABG x 1 1996   CABG--LIMA-LAD for ostial LAD (not felt to be PCI amenable). EF NORMAL then; LIMA now atretic  . Shortness of breath dyspnea    with exertion  . Stroke [facility]) 11-2014   TIA   . Urinary frequency      Past Surgical History:  Procedure Laterality Date  . ABDOMINAL AORTIC ANEURYSM REPAIR  2671   Complicated by mesenteric artery stenosis and splenic artery infarction with acquired Asplenia  . APPENDECTOMY    . BUNIONECTOMY  07/2011   right foot  . CARDIAC CATHETERIZATION  2005   (Most recent CATH) - ostial LAD lesion 20-30% (down from 90% initially). Atretic LIMA. Minimal disease the RCA and Circumflex system.  [name] CARPAL TUNNEL RELEASE Left   . CATARACT EXTRACTION Bilateral   . CERVICAL SPINE SURGERY     plate 2008 Dr. Saintclair Halsted  . [place]   INCLUDED AN INTERNAL MAMMARY ARTERY TO THE LAD. EF WAS NORMAL  . INGUINAL HERNIA REPAIR Right   . LAPAROSCOPIC APPENDECTOMY N/A 06/28/2016   Procedure: APPENDECTOMY LAPAROSCOPIC;  Surgeon: Leighton Ruff, MD;  Location: [facility];  Service: General;  Laterality: N/A;  . NM MYOVIEW LTD  March 2012   Fixed anteroseptal, apical and inferoapical defect with moderate size. Most likely scar. Mild subendocardial ischemia. EF 71% LOW RISK.   [name] SPLENECTOMY    . TRANSTHORACIC ECHOCARDIOGRAM  12/2014   [facility]: Normal LV size & function. EF 55-60%,   . vagina polyp    .  VIDEO ASSISTED THORACOSCOPY (VATS)/WEDGE RESECTION Left 03/02/2015   Procedure: VIDEO ASSISTED THORACOSCOPY (VATS), MINI THORACOTOMY, LEFT UPPER LOBE WEDGE, TAKE DOWN OF INTERNAL MAMMARY LESIONS, PLACEMENT OF ON-Q PUMP;  Surgeon: Grace Isaac, MD;  Location: [place];  Service: Thoracic;  Laterality: Left;  [name] VIDEO BRONCHOSCOPY N/A 03/02/2015   Procedure: BRONCHOSCOPY;  Surgeon: Grace Isaac, MD;  Location: [place];  Service: Thoracic;  Laterality: N/A;  . VIDEO BRONCHOSCOPY WITH ENDOBRONCHIAL NAVIGATION N/A 10/08/2017   Procedure: VIDEO BRONCHOSCOPY WITH ENDOBRONCHIAL NAVIGATION WITH BIOPSIES OF LEFT UPPER LOBE AND LEFT LOWER LOBE;  Surgeon: Grace Isaac, MD;  Location: [place];  Service: Thoracic;  Laterality: N/A;    There were no vitals filed for  this visit.  Subjective Assessment - 08/28/19 1309    Subjective  I feel like there is something there and it won't come out and that is frustrating.    Patient Stated Goals  feel like I am emptying and not have to bear down as much    Currently in Pain?  No/denies                       [facility] Adult PT Treatment/Exercise - 08/28/19 0001      Neuro Re-ed    Neuro Re-ed Details   breathing and bulging with external tactile cues; pt cannot feel but she is doing well with cue to push belly out and make it hard.  Able to contract and lift pelvic floor with pelvic tilts      Lumbar Exercises: Seated   Other Seated Lumbar Exercises  sitting and bulging to simulate BM      Lumbar Exercises: Supine   Pelvic Tilt  20 reps   TC and kegel with breathing and "s" sound     Lumbar Exercises: Quadruped   Other Quadruped Lumbar Exercises  forearms on mat with kegel toes in      Manual Therapy   Manual therapy comments  ;umbar and thoracic STM and myfascial release               PT Short Term Goals - 08/06/19 1718      PT SHORT TERM GOAL #1   Title  25% reduction in leakage    Time  4    Period  Weeks    Status  New    Target Date  09/02/19      PT SHORT TERM GOAL #2   Title  ind with initial HEP    Time  4    Period  Weeks    Status  New    Target Date  09/02/19      PT SHORT TERM GOAL #3   Title  able to correctly engage and activate pelvic floor without bearing down    Time  4    Period  Weeks    Status  New    Target Date  09/02/19        PT Long Term Goals - 08/06/19 1714      PT LONG TERM GOAL #1   Title  Pt will report she is having less frequent BM due to able to greater pelvic floor strength and endurance    Time  8    Period  Weeks    Status  New    Target Date  09/30/19      PT LONG TERM GOAL #2   Title  ind with advanced HEP    Time  8    Period  Weeks    Status  New    Target Date  09/30/19      PT LONG TERM GOAL #3   Title  Pt  will report 50% reduction in leakage    Time  8    Period  Weeks    Status  New    Target Date  09/30/19      PT LONG TERM GOAL #4   Title  Pt will demonstrate ability to contract pelvic floor with at least 2/5 MMT and hold for 10 seconds without holding her breath in order to perform functional tasks without leakage    Time  8    Period  Weeks    Status  New    Target Date  09/30/19            Plan - 08/28/19 1307    Clinical Impression Statement  Pt needs a lot of VC and TC to engage muscles correctly.  She did well with pelvic tilt to contract and breathing techniques for bulging.  Pt continues to have the same issues and wiill need extra time to learn the correct motor patterns.    PT Treatment/Interventions  ADLs/Self Care Home Management;Biofeedback;Cryotherapy;Electrical Stimulation;Manual techniques;Neuromuscular re-education;Therapeutic exercise;Therapeutic activities;Patient/family education;Dry needling;Taping;Moist Heat;Passive range of motion    PT Next Visit Plan  thoracic and lumbar STM as needed for improve mobility and ability to take deep breaths; f/u on kegel and bulging    PT Home Exercise Plan  Access Code: [email or web address]    Consulted and Agree with Plan of Care  Patient       Patient will benefit from skilled therapeutic intervention in order to improve the following deficits and impairments:  Impaired tone, Decreased strength, Postural dysfunction, Decreased coordination, Decreased range of motion, Increased muscle spasms  Visit Diagnosis: Muscle weakness (generalized)  Unspecified lack of coordination     Problem List Patient Active Problem List   Diagnosis Date Noted  . Ankle swelling 08/08/2019  . Hyperkalemia 05/15/2019  . Osteoporosis 02/26/2018  . Colon polyp 06/28/2016  . COPD GOLD I 05/28/2016  . Upper airway cough syndrome 05/22/2016  . Acute respiratory failure with hypoxemia ([place]) 04/16/2016  . Acute respiratory failure with hypoxia ([place])  04/15/2016  . Acute bronchitis 04/03/2016  . Wrist pain, right 12/14/2015  . Lung mass 03/02/2015  . Pre-operative cardiovascular examination 02/21/2015  . DOE (dyspnea on exertion) 02/16/2015  . Solitary pulmonary nodule 02/15/2015  . TIA (transient ischemic attack) 12/31/2014  . Diarrhea 11/26/2014  . Well adult exam 11/26/2014  . Seasonal allergies 12/31/2013  . 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 Dyslipidemia, goal LDL below 70   . Pernicious anemia 09/04/2012  . 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 Long term (current) use of anticoagulants 11/08/2010  . Rheumatoid arthritis ([place]) 06/23/2010  . Pain in joint 11/26/2009  . 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 Hypothyroidism 12/08/2006  . MACULAR DEGENERATION 12/08/2006  . Essential hypertension 12/08/2006  . Diaphragmatic hernia 12/08/2006  . CRI (chronic renal insufficiency), stage 3 (moderate) 12/08/2006  . ASPLENIA 12/08/2006  . 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  . 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) 12/22/1994    Class: History of  . 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; now atretic and significantly improved LAD lesion without intervention 12/22/1994    Class: History of    Jule Ser, PT 08/28/2019, 1:10 PM  [facility] [address], [place], [place], [address] Phone: [phone]   Fax:  [phone]  Name: SHANDY CHECO MRN: [id number] Date of Birth: 04/06/1941

## 2019-09-01 ENCOUNTER — Ambulatory Visit (INDEPENDENT_AMBULATORY_CARE_PROVIDER_SITE_OTHER): Payer: Medicare PPO | Admitting: Surgery

## 2019-09-01 ENCOUNTER — Other Ambulatory Visit: Payer: Self-pay

## 2019-09-01 ENCOUNTER — Encounter: Payer: Self-pay | Admitting: Surgery

## 2019-09-01 VITALS — BP 130/76 | HR 52 | Temp 97.8°F | Resp 20 | Ht 63.0 in | Wt 147.0 lb

## 2019-09-01 DIAGNOSIS — I728 Aneurysm of other specified arteries: Secondary | ICD-10-CM

## 2019-09-01 NOTE — Progress Notes (Signed)
[facility] [place]  Patient name: [name] MRN: [id number] DOB: [birth year] Sex: female   REASON FOR VISIT:    Follow up  [place]:    The patient is back today for follow up. I inherited her from Dr. Amedeo Plenty in 2011. She has a history of multiple splenic aneurysms and required an emergent splenectomy for a ruptured splenic artery aneurysm by Dr. Leafy Kindle in 1996. 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 She has been treated in the past with steroids for polyarteritis or fibromuscular disease. Currently she is not on steroids. She takes a statin for hypercholesterolemia.  She is medically managed for hypertension.  She denies any abdominal pain or back pain.  PAST MEDICAL HISTORY:   Past Medical History:  Diagnosis Date  . Abdominal aortic aneurysm (HCC)    REPAIRED IN 1996 BY [name]  AND HAS RECENTLY BEEN FOLLOWED BY [name]  . 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 Adenomatous colon polyp    tubular  . Anemia   . CAD in native artery 1996, 2002, 2005    Status post CABG x1 with LIMA-LAD for ostial LAD 90% stenosis --> down to 50% in 2002 and 30% in 2005.;  Atretic LIMA; Myoview 06/2010: Fixed anteroseptal, apical and inferoapical defect with moderate size. Most likely scar. Mild subendocardial ischemia. EF 71% LOW RISK.   [name] Cervical disc disease    fracture  . Chronic kidney disease   . COPD (chronic obstructive pulmonary disease) ([place])   . Diverticulosis   . Hyperlipidemia   . Hypertension   . Hypothyroidism (acquired)    hypo  . Myocardial infarction [facility]) Aug. 2016   TIA  . Polymyalgia rheumatica (HCC)    2011 Dr. Charlestine Night  . Rheumatoid  arthritis ([place]) 2011   Dr.Truslow; fracture knees, hands and wrists -   . S/P CABG x 1 1996   CABG--LIMA-LAD for ostial LAD (not felt to be PCI amenable). EF NORMAL then; LIMA now atretic  . Shortness of breath dyspnea    with exertion  . Stroke [facility]) 11-2014   TIA   . Urinary frequency      FAMILY HISTORY:   Family History  Problem Relation Age of Onset  . Hypertension Mother   . Diabetes Mother   . Heart disease Mother   . Hyperlipidemia Mother   . Stroke Father   . Hyperlipidemia Sister   . Hypertension Sister   . Hypertension Daughter   . Cancer Paternal Uncle        Deceased from cancer not sure of site  . Hypertension Son   . Hyperlipidemia Son   . Hyperlipidemia Son   . Hypertension Son   . Hyperlipidemia Son   . Hypertension Son   . Hypertension Other   . Coronary artery disease Other   . Asthma Neg Hx   . Colon cancer Neg Hx     SOCIAL HISTORY:   Social History   Tobacco Use  . Smoking status: Former Smoker    Quit date: 12/04/1958    Years since quitting: 60.7  . Smokeless tobacco: Never Used  Substance Use Topics  . Alcohol use: Yes    Comment: occassionally  ALLERGIES:   Allergies  Allergen Reactions  . Aspirin Other (See Comments)    Hurts stomach  . Codeine Nausea And Vomiting  . Nitroglycerin Other (See Comments)    Heart rate drops     CURRENT MEDICATIONS:   Current Outpatient Medications  Medication Sig Dispense Refill  . acetaminophen (TYLENOL) 500 MG tablet Take 500 mg by mouth every 6 (six) hours as needed (pain).     [name] albuterol (VENTOLIN HFA) 108 (90 Base) MCG/ACT inhaler Inhale 2 puffs into the lungs every 6 (six) hours as needed for wheezing or shortness of breath. 8 g 2  . amLODipine (NORVASC) 5 MG tablet Take 1 tablet (5 mg total) by mouth daily. 90 tablet 3  . atorvastatin (LIPITOR) 80 MG tablet Take 1 tablet (80 mg total) by mouth daily. 90 tablet 3  . cetirizine (ZYRTEC) 10 MG chewable tablet Chew 10 mg by mouth  daily.    . Cholecalciferol (VITAMIN D3) 50 MCG (2000 UT) capsule Take 1 capsule (2,000 Units total) by mouth daily. 100 capsule 3  . clopidogrel (PLAVIX) 75 MG tablet Take 1 tablet (75 mg total) by mouth daily. 90 tablet 3  . famotidine (PEPCID) 20 MG tablet One at bedtime 90 tablet 2  . hydrochlorothiazide (HYDRODIURIL) 12.5 MG tablet Take 1 tablet (12.5 mg total) by mouth daily. 30 tablet 3  . leflunomide (ARAVA) 20 MG tablet Take 20 mg by mouth daily.     [name] levothyroxine (SYNTHROID) 88 MCG tablet Take 1 tablet (88 mcg total) by mouth daily before breakfast. 90 tablet 3  . loperamide (IMODIUM A-D) 2 MG tablet Take 2 mg by mouth 4 (four) times daily as needed for diarrhea or loose stools.    [name] losartan (COZAAR) 100 MG tablet Take 0.5 tablets (50 mg total) by mouth daily. 90 tablet 3  . methylcellulose (CITRUCEL) oral powder Take 1 packet by mouth daily.    . metoprolol tartrate (LOPRESSOR) 50 MG tablet Take 1.5 tablets (75 mg total) by mouth 2 (two) times daily. 180 tablet 3  . pantoprazole (PROTONIX) 40 MG tablet Take 1 tablet (40 mg total) by mouth daily. Take 30-60 min before first meal of the day 90 tablet 3  . triamcinolone (NASACORT ALLERGY 24HR) 55 MCG/ACT AERO nasal inhaler Place 1 spray into the nose daily.      No current facility-administered medications for this visit.    REVIEW OF SYSTEMS:   [X]  denotes positive finding, [ ]  denotes negative finding Cardiac  Comments:  Chest pain or chest pressure:    Shortness of breath upon exertion: x   Short of breath when lying flat:    Irregular heart rhythm:        Vascular    Pain in calf, thigh, or hip brought on by ambulation: x   Pain in feet at night that wakes you up from your sleep:     Blood clot in your veins:    Leg swelling:         Pulmonary    Oxygen at home:    Productive cough:     Wheezing:         Neurologic    Sudden weakness in arms or legs:     Sudden numbness in arms or legs:     Sudden onset of  difficulty speaking or slurred speech:    Temporary loss of vision in one eye:     Problems with dizziness:  Gastrointestinal    Blood in stool:     Vomited blood:         Genitourinary    Burning when urinating:     Blood in urine:        Psychiatric    Major depression:         Hematologic    Bleeding problems:    Problems with blood clotting too easily:        Skin    Rashes or ulcers:        Constitutional    Fever or chills:      PHYSICAL EXAM:   Vitals:   09/01/19 1149  BP: 130/76  Pulse: (!) 52  Resp: 20  Temp: 97.8 F (36.6 C)  SpO2: 96%  Weight: 147 lb (66.7 kg)  Height: 5\' 3"  (1.6 m)    GENERAL: The patient is a well-nourished female, in no acute distress. The vital signs are documented above. CARDIAC: There is a regular rate and rhythm.  VASCULAR: I could not palpate pedal pulses PULMONARY: Non-labored respirations ABDOMEN: Soft and non-tender MUSCULOSKELETAL: There are no major deformities or cyanosis. NEUROLOGIC: No focal weakness or paresthesias are detected. SKIN: There are no ulcers or rashes noted. PSYCHIATRIC: The patient has a normal affect.  STUDIES:   I have reviewed the following studies  MRI 1. 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 2. Stable at least moderate stenosis of the SMA origin. 3. Status post splenectomy with splenic artery ligation. No evidence of recurrent splenic artery aneurysm. 4. 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 5.  Aortic Atherosclerosis (ICD10-170.0). 6. Additional ancillary findings as above without significant interval change. 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  I will continue  with this protocol.  No worrisome findings were identified on her MRI today.    Leia Alf, MD, FACS [facility] [phone] Pager [phone]

## 2019-09-02 DIAGNOSIS — M7062 Trochanteric bursitis, left hip: Secondary | ICD-10-CM | POA: Diagnosis not present

## 2019-09-02 DIAGNOSIS — M0579 Rheumatoid arthritis with rheumatoid factor of multiple sites without organ or systems involvement: Secondary | ICD-10-CM | POA: Diagnosis not present

## 2019-09-02 DIAGNOSIS — R2689 Other abnormalities of gait and mobility: Secondary | ICD-10-CM | POA: Diagnosis not present

## 2019-09-02 DIAGNOSIS — M545 Low back pain: Secondary | ICD-10-CM | POA: Diagnosis not present

## 2019-09-02 DIAGNOSIS — Z79899 Other long term (current) drug therapy: Secondary | ICD-10-CM | POA: Diagnosis not present

## 2019-09-04 ENCOUNTER — Ambulatory Visit: Payer: Medicare PPO | Attending: Gastroenterology | Admitting: Physical Therapy

## 2019-09-04 ENCOUNTER — Encounter: Payer: Self-pay | Admitting: Physical Therapy

## 2019-09-04 ENCOUNTER — Other Ambulatory Visit: Payer: Self-pay

## 2019-09-04 DIAGNOSIS — R279 Unspecified lack of coordination: Secondary | ICD-10-CM | POA: Insufficient documentation

## 2019-09-04 DIAGNOSIS — M6281 Muscle weakness (generalized): Secondary | ICD-10-CM | POA: Insufficient documentation

## 2019-09-04 NOTE — Therapy (Signed)
[facility] [address] [place], [place], [address] Phone: [phone]   Fax:  [phone]  Physical Therapy Treatment  Patient Details  Name: [name] MRN: [id number] Date of Birth: [birth year] Referring Provider (PT): Armbruster, Carlota Raspberry, MD   Encounter Date: 09/04/2019  PT End of Session - 09/04/19 1513    Visit Number  5    Date for PT Re-Evaluation  09/30/19    PT Start Time  1225    PT Stop Time  1307    PT Time Calculation (min)  42 min    Activity Tolerance  Patient tolerated treatment well    Behavior During Therapy  [facility] for tasks assessed/performed       Past Medical History:  Diagnosis Date  . Abdominal aortic aneurysm (HCC)    REPAIRED IN 1996 BY [name]  AND HAS RECENTLY BEEN FOLLOWED BY [name]  . 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 Adenomatous colon polyp    tubular  . Anemia   . CAD in native artery 1996, 2002, 2005    Status post CABG x1 with LIMA-LAD for ostial LAD 90% stenosis --> down to 50% in 2002 and 30% in 2005.;  Atretic LIMA; Myoview 06/2010: Fixed anteroseptal, apical and inferoapical defect with moderate size. Most likely scar. Mild subendocardial ischemia. EF 71% LOW RISK.   [name] Cervical disc disease    fracture  . Chronic kidney disease   . COPD (chronic obstructive pulmonary disease) ([place])   . Diverticulosis   . Hyperlipidemia   . Hypertension   . Hypothyroidism (acquired)    hypo  . Myocardial infarction [facility]) Aug. 2016   TIA  . Polymyalgia rheumatica (HCC)    2011 Dr. Charlestine Night  . Rheumatoid arthritis ([place]) 2011   Dr.Truslow; fracture knees, hands and wrists -   . S/P CABG x 1 1996   CABG--LIMA-LAD for ostial LAD (not felt to be PCI amenable). EF NORMAL then; LIMA now atretic  . Shortness of breath dyspnea    with exertion  . Stroke [facility]) 11-2014   TIA   . Urinary frequency     Past  Surgical History:  Procedure Laterality Date  . ABDOMINAL AORTIC ANEURYSM REPAIR  2831   Complicated by mesenteric artery stenosis and splenic artery infarction with acquired Asplenia  . APPENDECTOMY    . BUNIONECTOMY  07/2011   right foot  . CARDIAC CATHETERIZATION  2005   (Most recent CATH) - ostial LAD lesion 20-30% (down from 90% initially). Atretic LIMA. Minimal disease the RCA and Circumflex system.  [name] CARPAL TUNNEL RELEASE Left   . CATARACT EXTRACTION Bilateral   . CERVICAL SPINE SURGERY     plate 2008 Dr. Saintclair Halsted  . [place]   INCLUDED AN INTERNAL MAMMARY ARTERY TO THE LAD. EF WAS NORMAL  . INGUINAL HERNIA REPAIR Right   . LAPAROSCOPIC APPENDECTOMY N/A 06/28/2016   Procedure: APPENDECTOMY LAPAROSCOPIC;  Surgeon: Leighton Ruff, MD;  Location: [facility];  Service: General;  Laterality: N/A;  . NM MYOVIEW LTD  March 2012   Fixed anteroseptal, apical and inferoapical defect with moderate size. Most likely scar. Mild subendocardial ischemia. EF 71% LOW RISK.   [name] SPLENECTOMY    . TRANSTHORACIC ECHOCARDIOGRAM  12/2014   [facility]: Normal LV size & function. EF 55-60%,   . vagina polyp    .  VIDEO ASSISTED THORACOSCOPY (VATS)/WEDGE RESECTION Left 03/02/2015   Procedure: VIDEO ASSISTED THORACOSCOPY (VATS), MINI THORACOTOMY, LEFT UPPER LOBE WEDGE, TAKE DOWN OF INTERNAL MAMMARY LESIONS, PLACEMENT OF ON-Q PUMP;  Surgeon: Grace Isaac, MD;  Location: [place];  Service: Thoracic;  Laterality: Left;  [name] VIDEO BRONCHOSCOPY N/A 03/02/2015   Procedure: BRONCHOSCOPY;  Surgeon: Grace Isaac, MD;  Location: [place];  Service: Thoracic;  Laterality: N/A;  . VIDEO BRONCHOSCOPY WITH ENDOBRONCHIAL NAVIGATION N/A 10/08/2017   Procedure: VIDEO BRONCHOSCOPY WITH ENDOBRONCHIAL NAVIGATION WITH BIOPSIES OF LEFT UPPER LOBE AND LEFT LOWER LOBE;  Surgeon: Grace Isaac, MD;  Location: [place];  Service: Thoracic;  Laterality: N/A;    There were no vitals filed for this  visit.  Subjective Assessment - 09/04/19 1228    Subjective  I am going a lot.  No leakage but it is still a problem after dinner    Patient Stated Goals  feel like I am emptying and not have to bear down as much    Currently in Pain?  No/denies                       [facility] Adult PT Treatment/Exercise - 09/04/19 0001      Lumbar Exercises: Standing   Shoulder Extension  Strengthening;Both;20 reps;Theraband  (Pended)     Theraband Level (Shoulder Extension)  Level 1 (Yellow)  (Pended)     Other Standing Lumbar Exercises  reverse isometric flexion yellow band - 20x  (Pended)       Lumbar Exercises: Seated   Other Seated Lumbar Exercises  horizontal abduction with core bracing  (Pended)       Lumbar Exercises: Supine   Bent Knee Raise  10 reps    Large Ball Abdominal Isometric Limitations  ball presses; yellow band pull      Manual Therapy   Manual therapy comments  lumbar and thoracic STM and myfascial release               PT Short Term Goals - 08/06/19 1718      PT SHORT TERM GOAL #1   Title  25% reduction in leakage    Time  4    Period  Weeks    Status  New    Target Date  09/02/19      PT SHORT TERM GOAL #2   Title  ind with initial HEP    Time  4    Period  Weeks    Status  New    Target Date  09/02/19      PT SHORT TERM GOAL #3   Title  able to correctly engage and activate pelvic floor without bearing down    Time  4    Period  Weeks    Status  New    Target Date  09/02/19        PT Long Term Goals - 08/06/19 1714      PT LONG TERM GOAL #1   Title  Pt will report she is having less frequent BM due to able to greater pelvic floor strength and endurance    Time  8    Period  Weeks    Status  New    Target Date  09/30/19      PT LONG TERM GOAL #2   Title  ind with advanced HEP    Time  8    Period  Weeks    Status  New  Target Date  09/30/19      PT LONG TERM GOAL #3   Title  Pt will report 50% reduction in leakage     Time  8    Period  Weeks    Status  New    Target Date  09/30/19      PT LONG TERM GOAL #4   Title  Pt will demonstrate ability to contract pelvic floor with at least 2/5 MMT and hold for 10 seconds without holding her breath in order to perform functional tasks without leakage    Time  8    Period  Weeks    Status  New    Target Date  09/30/19            Plan - 09/04/19 1518    Clinical Impression Statement  Pt did much better at coordination of core and breathing.  She wasn't inhaling or using the valsalva maneuver.  Pt is unable to hold her core for more than 1 second at a time.  Pt was given updated exercises and added to HEP    PT Treatment/Interventions  ADLs/Self Care Home Management;Biofeedback;Cryotherapy;Electrical Stimulation;Manual techniques;Neuromuscular re-education;Therapeutic exercise;Therapeutic activities;Patient/family education;Dry needling;Taping;Moist Heat;Passive range of motion    PT Next Visit Plan  f/u on added core strength and lumbar flexion; add pelvic contraction to exercises and work on Soil scientist and hold    PT Home Exercise Plan  Access Code: [email or web address]    Consulted and Agree with Plan of Care  Patient       Patient will benefit from skilled therapeutic intervention in order to improve the following deficits and impairments:  Impaired tone, Decreased strength, Postural dysfunction, Decreased coordination, Decreased range of motion, Increased muscle spasms  Visit Diagnosis: Muscle weakness (generalized)  Unspecified lack of coordination     Problem List Patient Active Problem List   Diagnosis Date Noted  . Ankle swelling 08/08/2019  . Hyperkalemia 05/15/2019  . Osteoporosis 02/26/2018  . Colon polyp 06/28/2016  . COPD GOLD I 05/28/2016  . Upper airway cough syndrome 05/22/2016  . Acute respiratory failure with hypoxemia ([place]) 04/16/2016  . Acute respiratory failure with hypoxia ([place]) 04/15/2016  . Acute bronchitis 04/03/2016  . Wrist  pain, right 12/14/2015  . Lung mass 03/02/2015  . Pre-operative cardiovascular examination 02/21/2015  . DOE (dyspnea on exertion) 02/16/2015  . Solitary pulmonary nodule 02/15/2015  . TIA (transient ischemic attack) 12/31/2014  . Diarrhea 11/26/2014  . Well adult exam 11/26/2014  . Seasonal allergies 12/31/2013  . Splenic artery aneurysm ([place]) 04/07/2013  . Dyslipidemia, goal LDL below 70   . Pernicious anemia 09/04/2012  . 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 Long term (current) use of anticoagulants 11/08/2010  . Rheumatoid arthritis ([place]) 06/23/2010  . Pain in joint 11/26/2009  . 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 Hypothyroidism 12/08/2006  . MACULAR DEGENERATION 12/08/2006  . Essential hypertension 12/08/2006  . Diaphragmatic hernia 12/08/2006  . CRI (chronic renal insufficiency), stage 3 (moderate) 12/08/2006  . ASPLENIA 12/08/2006  . 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  . 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) 12/22/1994    Class: History of  . 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; now atretic and significantly improved LAD lesion without intervention 12/22/1994    Class: History of    Jule Ser, PT 09/04/2019, 3:23 PM  [facility] [address], [place], [place], [address] Phone: [phone]   Fax:  [phone]  Name: [name] MRN: [id number] Date of Birth: [birth year]

## 2019-09-09 DIAGNOSIS — R2689 Other abnormalities of gait and mobility: Secondary | ICD-10-CM | POA: Diagnosis not present

## 2019-09-09 DIAGNOSIS — M545 Low back pain: Secondary | ICD-10-CM | POA: Diagnosis not present

## 2019-09-09 DIAGNOSIS — M7062 Trochanteric bursitis, left hip: Secondary | ICD-10-CM | POA: Diagnosis not present

## 2019-09-11 ENCOUNTER — Other Ambulatory Visit: Payer: Self-pay

## 2019-09-11 ENCOUNTER — Ambulatory Visit: Payer: Medicare PPO | Admitting: Physical Therapy

## 2019-09-11 DIAGNOSIS — R279 Unspecified lack of coordination: Secondary | ICD-10-CM | POA: Diagnosis not present

## 2019-09-11 DIAGNOSIS — M6281 Muscle weakness (generalized): Secondary | ICD-10-CM | POA: Diagnosis not present

## 2019-09-11 NOTE — Therapy (Signed)
[facility] [address], [place], [place], [address] Phone: [phone]   Fax:  [phone]  Physical Therapy Treatment  Patient Details  Name: [name] MRN: [id number] Date of Birth: 14-Jan-1941 Referring Provider (PT): Armbruster, Carlota Raspberry, MD   Encounter Date: 09/11/2019  PT End of Session - 09/11/19 1313    Visit Number  6    Date for PT Re-Evaluation  09/30/19    PT Start Time  1227    PT Stop Time  1315    PT Time Calculation (min)  48 min    Activity Tolerance  Patient tolerated treatment well    Behavior During Therapy  [facility] for tasks assessed/performed       Past Medical History:  Diagnosis Date  . Abdominal aortic aneurysm (HCC)    REPAIRED IN 1996 BY [name]  AND HAS RECENTLY BEEN FOLLOWED BY [name]  . 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 Adenomatous colon polyp    tubular  . Anemia   . CAD in native artery 1996, 2002, 2005    Status post CABG x1 with LIMA-LAD for ostial LAD 90% stenosis --> down to 50% in 2002 and 30% in 2005.;  Atretic LIMA; Myoview 06/2010: Fixed anteroseptal, apical and inferoapical defect with moderate size. Most likely scar. Mild subendocardial ischemia. EF 71% [name] RISK.   [name] Cervical disc disease    fracture  . Chronic kidney disease   . COPD (chronic obstructive pulmonary disease) ([place])   . Diverticulosis   . Hyperlipidemia   . Hypertension   . Hypothyroidism (acquired)    hypo  . Myocardial infarction [facility]) Aug. 2016   TIA  . Polymyalgia rheumatica (HCC)    2011 Dr. Charlestine Night  . Rheumatoid arthritis ([place]) 2011   Dr.Truslow; fracture knees, hands and wrists -   . S/P CABG x 1 1996   CABG--LIMA-LAD for ostial LAD (not felt to be PCI amenable). EF NORMAL then; LIMA now atretic  . Shortness of breath dyspnea    with exertion  . Stroke [facility]) 11-2014   TIA   . Urinary frequency      Past Surgical History:  Procedure Laterality Date  . ABDOMINAL AORTIC ANEURYSM REPAIR  8828   Complicated by mesenteric artery stenosis and splenic artery infarction with acquired Asplenia  . APPENDECTOMY    . BUNIONECTOMY  07/2011   right foot  . CARDIAC CATHETERIZATION  2005   (Most recent CATH) - ostial LAD lesion 20-30% (down from 90% initially). Atretic LIMA. Minimal disease the RCA and Circumflex system.  [name] CARPAL TUNNEL RELEASE Left   . CATARACT EXTRACTION Bilateral   . CERVICAL SPINE SURGERY     plate 2008 Dr. Saintclair Halsted  . [place]   INCLUDED AN INTERNAL MAMMARY ARTERY TO THE LAD. EF WAS NORMAL  . INGUINAL HERNIA REPAIR Right   . LAPAROSCOPIC APPENDECTOMY N/A 06/28/2016   Procedure: APPENDECTOMY LAPAROSCOPIC;  Surgeon: Leighton Ruff, MD;  [facility];  Service: General;  Laterality: N/A;  . NM MYOVIEW LTD  March 2012   Fixed anteroseptal, apical and inferoapical defect with moderate size. Most likely scar. Mild subendocardial ischemia. EF 71% [name] RISK.   [name] SPLENECTOMY    . TRANSTHORACIC ECHOCARDIOGRAM  12/2014   [facility]: Normal LV size & function. EF 55-60%,   . vagina polyp    .  VIDEO ASSISTED THORACOSCOPY (VATS)/WEDGE RESECTION Left 03/02/2015   Procedure: VIDEO ASSISTED THORACOSCOPY (VATS), MINI THORACOTOMY, LEFT UPPER LOBE WEDGE, TAKE DOWN OF INTERNAL MAMMARY LESIONS, PLACEMENT OF ON-Q PUMP;  Surgeon: Grace Isaac, MD;  Location: [place];  Service: Thoracic;  Laterality: Left;  [name] VIDEO BRONCHOSCOPY N/A 03/02/2015   Procedure: BRONCHOSCOPY;  Surgeon: Grace Isaac, MD;  Location: [place];  Service: Thoracic;  Laterality: N/A;  . VIDEO BRONCHOSCOPY WITH ENDOBRONCHIAL NAVIGATION N/A 10/08/2017   Procedure: VIDEO BRONCHOSCOPY WITH ENDOBRONCHIAL NAVIGATION WITH BIOPSIES OF LEFT UPPER LOBE AND LEFT LOWER LOBE;  Surgeon: Grace Isaac, MD;  Location: [place];  Service: Thoracic;  Laterality: N/A;    There were no vitals filed for  this visit.  Subjective Assessment - 09/11/19 1234    Subjective  I feel like I am emptying better.  Still having urgency after dinner    Patient Stated Goals  feel like I am emptying and not have to bear down as much    Currently in Pain?  No/denies                        [facility] Adult PT Treatment/Exercise - 09/11/19 0001      Neuro Re-ed    Neuro Re-ed Details   TC to pelvic floor for increased muscle activity      Lumbar Exercises: Aerobic   Nustep  L3 x 10 min - cues to activate core      Lumbar Exercises: Standing   Other Standing Lumbar Exercises  reverse isometric flexion yellow band - 20x    Other Standing Lumbar Exercises  yellow band around thighs - side step and hip flexion - cue to keep core engaged- 10x each side      Lumbar Exercises: Supine   Pelvic Tilt  20 reps    Pelvic Tilt Limitations  TC to engage pelvic floor    Bridge  10 reps   2 sets - one with ball one without   Bridge Limitations  TC to engage pelvic floor    Other Supine Lumbar Exercises  toes in kegel - 10x attempt to hold 3 sec still can only hold 1 sec             PT Education - 09/11/19 1421    Education Details  Access Code: [email or web address]    Person(s) Educated  Patient    Methods  Explanation;Demonstration;Verbal cues;Handout;Tactile cues    Comprehension  Verbalized understanding;Returned demonstration       PT Short Term Goals - 09/11/19 1236      PT SHORT TERM GOAL #1   Title  25% reduction in leakage    Baseline  50% better    Status  Achieved      PT SHORT TERM GOAL #2   Title  ind with initial HEP    Status  Achieved      PT SHORT TERM GOAL #3   Title  able to correctly engage and activate pelvic floor without bearing down    Baseline  was able to do this today    Status  Achieved        PT Long Term Goals - 08/06/19 1714      PT LONG TERM GOAL #1   Title  Pt will report she is having less frequent BM due to able to greater pelvic floor strength and  endurance    Time  8    Period  Weeks  Status  New    Target Date  09/30/19      PT LONG TERM GOAL #2   Title  ind with advanced HEP    Time  8    Period  Weeks    Status  New    Target Date  09/30/19      PT LONG TERM GOAL #3   Title  Pt will report 50% reduction in leakage    Time  8    Period  Weeks    Status  New    Target Date  09/30/19      PT LONG TERM GOAL #4   Title  Pt will demonstrate ability to contract pelvic floor with at least 2/5 MMT and hold for 10 seconds without holding her breath in order to perform functional tasks without leakage    Time  8    Period  Weeks    Status  New    Target Date  09/30/19            Plan - 09/11/19 1225    Clinical Impression Statement  Pt has met several short term goals and is making good progress overall.  Pt states she is starting to feel improvements.  She is able to coordinate pelvic floor lift with exercises.  Exercise progression was added to HEP and she will continue to benefit from skilled PT to work on strength, coordination and progress endurance.    PT Treatment/Interventions  ADLs/Self Care Home Management;Biofeedback;Cryotherapy;Electrical Stimulation;Manual techniques;Neuromuscular re-education;Therapeutic exercise;Therapeutic activities;Patient/family education;Dry needling;Taping;Moist Heat;Passive range of motion    PT Next Visit Plan  f/u on added core strength and lumbar flexion; add pelvic contraction to exercises and work on Soil scientist and hold    PT Home Exercise Plan  Access Code: [email or web address]    Consulted and Agree with Plan of Care  Patient       Patient will benefit from skilled therapeutic intervention in order to improve the following deficits and impairments:  Impaired tone, Decreased strength, Postural dysfunction, Decreased coordination, Decreased range of motion, Increased muscle spasms  Visit Diagnosis: Muscle weakness (generalized)  Unspecified lack of coordination     Problem  List Patient Active Problem List   Diagnosis Date Noted  . Ankle swelling 08/08/2019  . Hyperkalemia 05/15/2019  . Osteoporosis 02/26/2018  . Colon polyp 06/28/2016  . COPD GOLD I 05/28/2016  . Upper airway cough syndrome 05/22/2016  . Acute respiratory failure with hypoxemia ([place]) 04/16/2016  . Acute respiratory failure with hypoxia ([place]) 04/15/2016  . Acute bronchitis 04/03/2016  . Wrist pain, right 12/14/2015  . Lung mass 03/02/2015  . Pre-operative cardiovascular examination 02/21/2015  . DOE (dyspnea on exertion) 02/16/2015  . Solitary pulmonary nodule 02/15/2015  . TIA (transient ischemic attack) 12/31/2014  . Diarrhea 11/26/2014  . Well adult exam 11/26/2014  . Seasonal allergies 12/31/2013  . Splenic artery aneurysm ([place]) 04/07/2013  . Dyslipidemia, goal LDL below 70   . Pernicious anemia 09/04/2012  . 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 Long term (current) use of anticoagulants 11/08/2010  . Rheumatoid arthritis ([place]) 06/23/2010  . Pain in joint 11/26/2009  . 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 Hypothyroidism 12/08/2006  . MACULAR DEGENERATION 12/08/2006  . Essential hypertension 12/08/2006  . Diaphragmatic hernia 12/08/2006  . CRI (chronic renal insufficiency), stage 3 (moderate) 12/08/2006  . ASPLENIA 12/08/2006  . 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  . 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) 12/22/1994    Class: History of  .  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; now atretic and significantly improved LAD lesion without intervention 12/22/1994    Class: History of    Jule Ser, PT 09/11/2019, 2:22 PM  [facility] [address] [place], [place], [address] Phone: [phone]   Fax:  [phone]  Name: [name] MRN: [id number] Date of Birth: 04-15-1941

## 2019-09-11 NOTE — Patient Instructions (Signed)
Access Code: [email or web address]

## 2019-09-15 ENCOUNTER — Other Ambulatory Visit (HOSPITAL_COMMUNITY)
Admission: RE | Admit: 2019-09-15 | Discharge: 2019-09-15 | Disposition: A | Discharge status: Home / Self Care | Payer: Medicare PPO | Source: Ambulatory Visit | Attending: Internal Medicine | Admitting: Internal Medicine

## 2019-09-15 DIAGNOSIS — Z20822 Contact with and (suspected) exposure to covid-19: Secondary | ICD-10-CM | POA: Insufficient documentation

## 2019-09-15 DIAGNOSIS — Z01812 Encounter for preprocedural laboratory examination: Secondary | ICD-10-CM | POA: Insufficient documentation

## 2019-09-15 LAB — SARS CORONAVIRUS 2 (TAT 6-24 HRS): SARS Coronavirus 2: NEGATIVE

## 2019-09-16 ENCOUNTER — Other Ambulatory Visit: Payer: Self-pay

## 2019-09-16 ENCOUNTER — Ambulatory Visit: Payer: Medicare PPO | Admitting: Internal Medicine

## 2019-09-16 ENCOUNTER — Encounter: Payer: Self-pay | Admitting: Internal Medicine

## 2019-09-16 DIAGNOSIS — M25473 Effusion, unspecified ankle: Secondary | ICD-10-CM | POA: Diagnosis not present

## 2019-09-16 DIAGNOSIS — R0600 Dyspnea, unspecified: Secondary | ICD-10-CM | POA: Diagnosis not present

## 2019-09-16 DIAGNOSIS — R918 Other nonspecific abnormal finding of lung field: Secondary | ICD-10-CM | POA: Diagnosis not present

## 2019-09-16 DIAGNOSIS — M7062 Trochanteric bursitis, left hip: Secondary | ICD-10-CM | POA: Diagnosis not present

## 2019-09-16 DIAGNOSIS — D510 Vitamin B12 deficiency anemia due to intrinsic factor deficiency: Secondary | ICD-10-CM

## 2019-09-16 DIAGNOSIS — M069 Rheumatoid arthritis, unspecified: Secondary | ICD-10-CM | POA: Diagnosis not present

## 2019-09-16 DIAGNOSIS — R0609 Other forms of dyspnea: Secondary | ICD-10-CM

## 2019-09-16 DIAGNOSIS — R2689 Other abnormalities of gait and mobility: Secondary | ICD-10-CM | POA: Diagnosis not present

## 2019-09-16 DIAGNOSIS — N183 Chronic kidney disease, stage 3 unspecified: Secondary | ICD-10-CM

## 2019-09-16 DIAGNOSIS — M545 Low back pain: Secondary | ICD-10-CM | POA: Diagnosis not present

## 2019-09-16 LAB — CBC WITH DIFFERENTIAL/PLATELET
Basophils Absolute: 0.2 10*3/uL — ABNORMAL HIGH (ref 0.0–0.1)
Basophils Relative: 3.9 % — ABNORMAL HIGH (ref 0.0–3.0)
Eosinophils Absolute: 0.2 10*3/uL (ref 0.0–0.7)
Eosinophils Relative: 2.8 % (ref 0.0–5.0)
HCT: 39 % (ref 36.0–46.0)
Hemoglobin: 12.9 g/dL (ref 12.0–15.0)
Lymphocytes Relative: 23.7 % (ref 12.0–46.0)
Lymphs Abs: 1.4 10*3/uL (ref 0.7–4.0)
MCHC: 33.1 g/dL (ref 30.0–36.0)
MCV: 94.2 fl (ref 78.0–100.0)
Monocytes Absolute: 1.1 10*3/uL — ABNORMAL HIGH (ref 0.1–1.0)
Monocytes Relative: 18.1 % — ABNORMAL HIGH (ref 3.0–12.0)
Neutro Abs: 3.1 10*3/uL (ref 1.4–7.7)
Neutrophils Relative %: 51.5 % (ref 43.0–77.0)
Platelets: 292 10*3/uL (ref 150.0–400.0)
RBC: 4.14 Mil/uL (ref 3.87–5.11)
RDW: 15.7 % — ABNORMAL HIGH (ref 11.5–15.5)
WBC: 5.9 10*3/uL (ref 4.0–10.5)

## 2019-09-16 LAB — HEPATIC FUNCTION PANEL
ALT: 12 U/L (ref 0–35)
AST: 17 U/L (ref 0–37)
Albumin: 4.3 g/dL (ref 3.5–5.2)
Alkaline Phosphatase: 54 U/L (ref 39–117)
Bilirubin, Direct: 0.2 mg/dL (ref 0.0–0.3)
Total Bilirubin: 0.5 mg/dL (ref 0.2–1.2)
Total Protein: 6.8 g/dL (ref 6.0–8.3)

## 2019-09-16 LAB — BASIC METABOLIC PANEL
BUN: 57 mg/dL — ABNORMAL HIGH (ref 6–23)
CO2: 27 mEq/L (ref 19–32)
Calcium: 10 mg/dL (ref 8.4–10.5)
Chloride: 101 mEq/L (ref 96–112)
Creatinine, Ser: 1.96 mg/dL — ABNORMAL HIGH (ref 0.40–1.20)
GFR: 24.46 mL/min — ABNORMAL LOW (ref >60.00)
Glucose, Bld: 97 mg/dL (ref 70–99)
Potassium: 4.6 mEq/L (ref 3.5–5.1)
Sodium: 136 mEq/L (ref 135–145)

## 2019-09-16 NOTE — Assessment & Plan Note (Signed)
S/p recent CT

## 2019-09-16 NOTE — Assessment & Plan Note (Signed)
Pulm test on Thur this week

## 2019-09-16 NOTE — Assessment & Plan Note (Signed)
On B12 

## 2019-09-16 NOTE — Assessment & Plan Note (Signed)
Better w/HCTZ

## 2019-09-16 NOTE — Assessment & Plan Note (Signed)
On [name]

## 2019-09-16 NOTE — Progress Notes (Signed)
Subjective:  Patient ID: [name], female    DOB: October 10, 1940  Age: 79 y.o. MRN: [id number]  CC: No chief complaint on file.   HPI [name] presents for SOB and RA pains Pt stopped gabapentin per Dr Melvyn Novas; then the pt was put on HCTZ - it helped w/leg swelling, but not w/breathing. BNP was up. We walked together <100 ft - -- SOB.  Outpatient Medications Prior to Visit  Medication Sig Dispense Refill  . acetaminophen (TYLENOL) 500 MG tablet Take 500 mg by mouth every 6 (six) hours as needed (pain).     [name] albuterol (VENTOLIN HFA) 108 (90 Base) MCG/ACT inhaler Inhale 2 puffs into the lungs every 6 (six) hours as needed for wheezing or shortness of breath. 8 g 2  . amLODipine (NORVASC) 5 MG tablet Take 1 tablet (5 mg total) by mouth daily. 90 tablet 3  . atorvastatin (LIPITOR) 80 MG tablet Take 1 tablet (80 mg total) by mouth daily. 90 tablet 3  . Chlorpheniramine Maleate (CHLORPHEN PO) Take by mouth.    . Cholecalciferol (VITAMIN D3) 50 MCG (2000 UT) capsule Take 1 capsule (2,000 Units total) by mouth daily. 100 capsule 3  . clopidogrel (PLAVIX) 75 MG tablet Take 1 tablet (75 mg total) by mouth daily. 90 tablet 3  . famotidine (PEPCID) 20 MG tablet One at bedtime 90 tablet 2  . hydrochlorothiazide (HYDRODIURIL) 12.5 MG tablet Take 1 tablet (12.5 mg total) by mouth daily. 30 tablet 3  . leflunomide (ARAVA) 20 MG tablet Take 20 mg by mouth daily.     [name] levothyroxine (SYNTHROID) 88 MCG tablet Take 1 tablet (88 mcg total) by mouth daily before breakfast. 90 tablet 3  . loperamide (IMODIUM A-D) 2 MG tablet Take 2 mg by mouth 4 (four) times daily as needed for diarrhea or loose stools.    [name] losartan (COZAAR) 100 MG tablet Take 0.5 tablets (50 mg total) by mouth daily. 90 tablet 3  . metoprolol tartrate (LOPRESSOR) 50 MG tablet Take 1.5 tablets (75 mg total) by mouth 2 (two) times daily. 180 tablet 3  . pantoprazole (PROTONIX) 40 MG tablet Take 1 tablet (40 mg total) by mouth daily. Take  30-60 min before first meal of the day 90 tablet 3  . psyllium (METAMUCIL) 58.6 % packet Take 1 packet by mouth daily.    [name] triamcinolone (NASACORT ALLERGY 24HR) 55 MCG/ACT AERO nasal inhaler Place 1 spray into the nose daily.     . cetirizine (ZYRTEC) 10 MG chewable tablet Chew 10 mg by mouth daily.    . methylcellulose (CITRUCEL) oral powder Take 1 packet by mouth daily. (Patient not taking: Reported on 09/16/2019)     No facility-administered medications prior to visit.    ROS: Review of Systems  Constitutional: Negative for activity change, appetite change, chills, fatigue and unexpected weight change.  HENT: Negative for congestion, mouth sores and sinus pressure.   Eyes: Negative for visual disturbance.  Respiratory: Positive for shortness of breath. Negative for cough and chest tightness.   Gastrointestinal: Negative for abdominal pain and nausea.  Genitourinary: Negative for difficulty urinating, frequency and vaginal pain.  Musculoskeletal: Negative for back pain and gait problem.  Skin: Negative for pallor and rash.  Neurological: Negative for dizziness, tremors, weakness, numbness and headaches.  Psychiatric/Behavioral: Negative for confusion, sleep disturbance and suicidal ideas.    Objective:  BP 126/82 (BP Location: Left Arm, Patient Position: Sitting, Cuff Size: Normal)   Pulse 60  Temp 98 F (36.7 C) (Oral)   Ht 5\' 3"  (1.6 m)   Wt 146 lb (66.2 kg)   SpO2 95%   BMI 25.86 kg/m   BP Readings from Last 3 Encounters:  09/16/19 126/82  09/01/19 130/76  08/08/19 (!) 142/62    Wt Readings from Last 3 Encounters:  09/16/19 146 lb (66.2 kg)  09/01/19 147 lb (66.7 kg)  08/08/19 152 lb 9.6 oz (69.2 kg)    Physical Exam Constitutional:      General: She is not in acute distress.    Appearance: She is well-developed.  HENT:     Head: Normocephalic.     Right Ear: External ear normal.     Left Ear: External ear normal.     Nose: Nose normal.  Eyes:      General:        Right eye: No discharge.        Left eye: No discharge.     Conjunctiva/sclera: Conjunctivae normal.     Pupils: Pupils are equal, round, and reactive to light.  Neck:     Thyroid: No thyromegaly.     Vascular: No JVD.     Trachea: No tracheal deviation.  Cardiovascular:     Rate and Rhythm: Normal rate and regular rhythm.     Heart sounds: Normal heart sounds.  Pulmonary:     Effort: No respiratory distress.     Breath sounds: No stridor. No wheezing.  Abdominal:     General: Bowel sounds are normal. There is no distension.     Palpations: Abdomen is soft. There is no mass.     Tenderness: There is no abdominal tenderness. There is no guarding or rebound.  Musculoskeletal:        General: Tenderness present.     Cervical back: Normal range of motion and neck supple. Tenderness present.  Lymphadenopathy:     Cervical: No cervical adenopathy.  Skin:    Findings: No erythema or rash.  Neurological:     Mental Status: She is oriented to person, place, and time.     Cranial Nerves: No cranial nerve deficit.     Motor: No abnormal muscle tone.     Coordination: Coordination normal.     Gait: Gait abnormal.     Deep Tendon Reflexes: Reflexes normal.  Psychiatric:        Behavior: Behavior normal.        Thought Content: Thought content normal.        Judgment: Judgment normal.   L hip w/lat pain cane  Lab Results  Component Value Date   WBC 11.4 (H) 02/21/2019   HGB 11.5 (L) 02/21/2019   HCT 35.2 (L) 02/21/2019   PLT 386.0 02/21/2019   GLUCOSE 126 (H) 08/08/2019   CHOL 152 11/13/2018   TRIG 57.0 11/13/2018   HDL 72.50 11/13/2018   LDLDIRECT 162.1 10/28/2009   LDLCALC 68 11/13/2018   ALT 28 11/13/2018   AST 23 11/13/2018   NA 141 08/08/2019   K 4.4 08/08/2019   CL 106 08/08/2019   CREATININE 1.82 (H) 08/08/2019   BUN 49 (H) 08/08/2019   CO2 24 08/08/2019   TSH 2.57 11/13/2018   INR 0.89 05/20/2018    No results found.  Assessment & Plan:     [name], MD

## 2019-09-16 NOTE — Assessment & Plan Note (Signed)
Worse w/HCTZ Monitor BMET

## 2019-09-17 ENCOUNTER — Other Ambulatory Visit: Payer: Self-pay | Admitting: Internal Medicine

## 2019-09-17 DIAGNOSIS — I10 Essential (primary) hypertension: Secondary | ICD-10-CM

## 2019-09-17 LAB — TSH: TSH: 2.32 u[IU]/mL (ref 0.35–4.50)

## 2019-09-18 ENCOUNTER — Ambulatory Visit (INDEPENDENT_AMBULATORY_CARE_PROVIDER_SITE_OTHER): Payer: Medicare PPO | Admitting: Internal Medicine

## 2019-09-18 ENCOUNTER — Other Ambulatory Visit: Payer: Self-pay

## 2019-09-18 ENCOUNTER — Ambulatory Visit: Payer: Medicare Other | Admitting: Internal Medicine

## 2019-09-18 DIAGNOSIS — R0600 Dyspnea, unspecified: Secondary | ICD-10-CM | POA: Diagnosis not present

## 2019-09-18 DIAGNOSIS — J449 Chronic obstructive pulmonary disease, unspecified: Secondary | ICD-10-CM

## 2019-09-18 DIAGNOSIS — R0609 Other forms of dyspnea: Secondary | ICD-10-CM

## 2019-09-18 LAB — PULMONARY FUNCTION TEST
DL/VA % pred: 91 %
DL/VA: 3.74 ml/min/mmHg/L
DLCO cor % pred: 98 %
DLCO cor: 17.91 ml/min/mmHg
DLCO unc % pred: 98 %
DLCO unc: 17.91 ml/min/mmHg
FEF 25-75 Post: 1.58 L/sec
FEF 25-75 Pre: 0.87 L/sec
FEF2575-%Change-Post: 81 %
FEF2575-%Pred-Post: 121 %
FEF2575-%Pred-Pre: 66 %
FEV1-%Change-Post: 15 %
FEV1-%Pred-Post: 101 %
FEV1-%Pred-Pre: 87 %
FEV1-Post: 1.83 L
FEV1-Pre: 1.58 L
FEV1FVC-%Change-Post: 5 %
FEV1FVC-%Pred-Pre: 91 %
FEV6-%Change-Post: 9 %
FEV6-%Pred-Post: 110 %
FEV6-%Pred-Pre: 101 %
FEV6-Post: 2.56 L
FEV6-Pre: 2.34 L
FEV6FVC-%Change-Post: 0 %
FEV6FVC-%Pred-Post: 105 %
FEV6FVC-%Pred-Pre: 105 %
FVC-%Change-Post: 9 %
FVC-%Pred-Post: 105 %
FVC-%Pred-Pre: 96 %
FVC-Post: 2.57 L
FVC-Pre: 2.35 L
Post FEV1/FVC ratio: 71 %
Post FEV6/FVC ratio: 99 %
Pre FEV1/FVC ratio: 67 %
Pre FEV6/FVC Ratio: 99 %
RV % pred: 206 %
RV: 4.88 L
TLC % pred: 148 %
TLC: 7.29 L

## 2019-09-18 NOTE — Progress Notes (Signed)
Full PFT performed today. °

## 2019-09-23 DIAGNOSIS — M7062 Trochanteric bursitis, left hip: Secondary | ICD-10-CM | POA: Diagnosis not present

## 2019-09-23 DIAGNOSIS — R2689 Other abnormalities of gait and mobility: Secondary | ICD-10-CM | POA: Diagnosis not present

## 2019-09-23 DIAGNOSIS — M545 Low back pain: Secondary | ICD-10-CM | POA: Diagnosis not present

## 2019-09-25 ENCOUNTER — Encounter: Payer: Self-pay | Admitting: Physical Therapy

## 2019-09-25 ENCOUNTER — Ambulatory Visit: Payer: Medicare PPO | Admitting: Physical Therapy

## 2019-09-25 ENCOUNTER — Other Ambulatory Visit: Payer: Self-pay

## 2019-09-25 DIAGNOSIS — M6281 Muscle weakness (generalized): Secondary | ICD-10-CM

## 2019-09-25 DIAGNOSIS — R279 Unspecified lack of coordination: Secondary | ICD-10-CM

## 2019-09-25 NOTE — Therapy (Signed)
[facility] [address] [place], [place], [address] Phone: [phone]   Fax:  [phone]  Physical Therapy Treatment  Patient Details  Name: [name] MRN: [id number] Date of Birth: Jun 03, 1940 Referring Provider (PT): Armbruster, Carlota Raspberry, MD   Encounter Date: 09/25/2019  PT End of Session - 09/25/19 1357    Visit Number  7    Date for PT Re-Evaluation  09/30/19    PT Start Time  1227    PT Stop Time  1311    PT Time Calculation (min)  44 min    Activity Tolerance  Patient tolerated treatment well    Behavior During Therapy  [facility] for tasks assessed/performed       Past Medical History:  Diagnosis Date  . Abdominal aortic aneurysm (HCC)    REPAIRED IN 1996 BY [name]  AND HAS RECENTLY BEEN FOLLOWED BY [name]  . 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 Adenomatous colon polyp    tubular  . Anemia   . CAD in native artery 1996, 2002, 2005    Status post CABG x1 with LIMA-LAD for ostial LAD 90% stenosis --> down to 50% in 2002 and 30% in 2005.;  Atretic LIMA; Myoview 06/2010: Fixed anteroseptal, apical and inferoapical defect with moderate size. Most likely scar. Mild subendocardial ischemia. EF 71% LOW RISK.   [name] Cervical disc disease    fracture  . Chronic kidney disease   . COPD (chronic obstructive pulmonary disease) ([place])   . Diverticulosis   . Hyperlipidemia   . Hypertension   . Hypothyroidism (acquired)    hypo  . Myocardial infarction [facility]) Aug. 2016   TIA  . Polymyalgia rheumatica (HCC)    2011 Dr. Charlestine Night  . Rheumatoid arthritis ([place]) 2011   Dr.Truslow; fracture knees, hands and wrists -   . S/P CABG x 1 1996   CABG--LIMA-LAD for ostial LAD (not felt to be PCI amenable). EF NORMAL then; LIMA now atretic  . Shortness of breath dyspnea    with exertion  . Stroke [facility]) 11-2014   TIA   . Urinary frequency      Past Surgical History:  Procedure Laterality Date  . ABDOMINAL AORTIC ANEURYSM REPAIR  9449   Complicated by mesenteric artery stenosis and splenic artery infarction with acquired Asplenia  . APPENDECTOMY    . BUNIONECTOMY  07/2011   right foot  . CARDIAC CATHETERIZATION  2005   (Most recent CATH) - ostial LAD lesion 20-30% (down from 90% initially). Atretic LIMA. Minimal disease the RCA and Circumflex system.  [name] CARPAL TUNNEL RELEASE Left   . CATARACT EXTRACTION Bilateral   . CERVICAL SPINE SURGERY     plate 2008 Dr. Saintclair Halsted  . [place]   INCLUDED AN INTERNAL MAMMARY ARTERY TO THE LAD. EF WAS NORMAL  . INGUINAL HERNIA REPAIR Right   . LAPAROSCOPIC APPENDECTOMY N/A 06/28/2016   Procedure: APPENDECTOMY LAPAROSCOPIC;  Surgeon: Leighton Ruff, MD;  Location: [facility];  Service: General;  Laterality: N/A;  . NM MYOVIEW LTD  March 2012   Fixed anteroseptal, apical and inferoapical defect with moderate size. Most likely scar. Mild subendocardial ischemia. EF 71% LOW RISK.   [name] SPLENECTOMY    . TRANSTHORACIC ECHOCARDIOGRAM  12/2014   [facility]: Normal LV size & function. EF 55-60%,   . vagina polyp    .  VIDEO ASSISTED THORACOSCOPY (VATS)/WEDGE RESECTION Left 03/02/2015   Procedure: VIDEO ASSISTED THORACOSCOPY (VATS), MINI THORACOTOMY, LEFT UPPER LOBE WEDGE, TAKE DOWN OF INTERNAL MAMMARY LESIONS, PLACEMENT OF ON-Q PUMP;  Surgeon: Grace Isaac, MD;  Location: [place];  Service: Thoracic;  Laterality: Left;  [name] VIDEO BRONCHOSCOPY N/A 03/02/2015   Procedure: BRONCHOSCOPY;  Surgeon: Grace Isaac, MD;  Location: [place];  Service: Thoracic;  Laterality: N/A;  . VIDEO BRONCHOSCOPY WITH ENDOBRONCHIAL NAVIGATION N/A 10/08/2017   Procedure: VIDEO BRONCHOSCOPY WITH ENDOBRONCHIAL NAVIGATION WITH BIOPSIES OF LEFT UPPER LOBE AND LEFT LOWER LOBE;  Surgeon: Grace Isaac, MD;  Location: [place];  Service: Thoracic;  Laterality: N/A;    There were no vitals filed for  this visit.  Subjective Assessment - 09/25/19 1358    Subjective  Pt was putting a lamp together and reports she is a little short of breath from that.  Pt states pain is like usual in the back.  She was walking with SPC today. Pt reports she got to the bathroom and no leakage but it was close    Currently in Pain?  No/denies                        [facility] Adult PT Treatment/Exercise - 09/25/19 0001      Exercises   Other Exercises   Hooklying march 2lb 30x TC for pelvic floor and abdominal activation, red band horizontal abduction in supine 20x, alt UE/LE 1lb hand weights - 20x; Kegel in supine with TC to assess endurance; holding 1 sec and needed cues to do without holding breath; large ball TrA isometric overhead lift; foam roll presses with TrA and pelvic activation 10 x 5 sec hold,       Lumbar Exercises: Standing   Other Standing Lumbar Exercises  pelvic tilt against the wall, shoulder ext with TrA green band;       Lumbar Exercises: Seated   Other Seated Lumbar Exercises  kegel with UE flexion 1lb; with press on foam roll; TrA activation with press on foam roller               PT Short Term Goals - 09/11/19 1236      PT SHORT TERM GOAL #1   Title  25% reduction in leakage    Baseline  50% better    Status  Achieved      PT SHORT TERM GOAL #2   Title  ind with initial HEP    Status  Achieved      PT SHORT TERM GOAL #3   Title  able to correctly engage and activate pelvic floor without bearing down    Baseline  was able to do this today    Status  Achieved        PT Long Term Goals - 08/06/19 1714      PT LONG TERM GOAL #1   Title  Pt will report she is having less frequent BM due to able to greater pelvic floor strength and endurance    Time  8    Period  Weeks    Status  New    Target Date  09/30/19      PT LONG TERM GOAL #2   Title  ind with advanced HEP    Time  8    Period  Weeks    Status  New    Target Date  09/30/19      PT  LONG  TERM GOAL #3   Title  Pt will report 50% reduction in leakage    Time  8    Period  Weeks    Status  New    Target Date  09/30/19      PT LONG TERM GOAL #4   Title  Pt will demonstrate ability to contract pelvic floor with at least 2/5 MMT and hold for 10 seconds without holding her breath in order to perform functional tasks without leakage    Time  8    Period  Weeks    Status  New    Target Date  09/30/19            Plan - 09/25/19 1358    Clinical Impression Statement  Pt continues to need cues for breathing but was able to advance exercises today. Pt was educated in and performed thoracic extension stretch which she responded well to in order to improve ribcage mobilty.  Pt was assessed outside of clothing and still has difficulty holding pelvic floor and TrA contraction for more than one second.    Personal Factors and Comorbidities  Time since onset of injury/illness/exacerbation;Comorbidity 1    PT Treatment/Interventions  ADLs/Self Care Home Management;Biofeedback;Cryotherapy;Electrical Stimulation;Manual techniques;Neuromuscular re-education;Therapeutic exercise;Therapeutic activities;Patient/family education;Dry needling;Taping;Moist Heat;Passive range of motion    PT Next Visit Plan  re-eval; f/u on thoracic extension stretch; progress pelvic contraction to exercises and work on Soil scientist and hold    PT Home Exercise Plan  Access Code: [email or web address]    Consulted and Agree with Plan of Care  Patient       Patient will benefit from skilled therapeutic intervention in order to improve the following deficits and impairments:  Impaired tone, Decreased strength, Postural dysfunction, Decreased coordination, Decreased range of motion, Increased muscle spasms  Visit Diagnosis: Muscle weakness (generalized)  Unspecified lack of coordination     Problem List Patient Active Problem List   Diagnosis Date Noted  . Ankle swelling 08/08/2019  . Hyperkalemia 05/15/2019  .  Osteoporosis 02/26/2018  . Colon polyp 06/28/2016  . COPD GOLD I 05/28/2016  . Upper airway cough syndrome 05/22/2016  . Acute respiratory failure with hypoxemia ([place]) 04/16/2016  . Acute respiratory failure with hypoxia ([place]) 04/15/2016  . Acute bronchitis 04/03/2016  . Wrist pain, right 12/14/2015  . Lung mass 03/02/2015  . Pre-operative cardiovascular examination 02/21/2015  . DOE (dyspnea on exertion) 02/16/2015  . Solitary pulmonary nodule 02/15/2015  . TIA (transient ischemic attack) 12/31/2014  . Diarrhea 11/26/2014  . Well adult exam 11/26/2014  . Seasonal allergies 12/31/2013  . 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 Dyslipidemia, goal LDL below 70   . Pernicious anemia 09/04/2012  . 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 Long term (current) use of anticoagulants 11/08/2010  . Rheumatoid arthritis ([place]) 06/23/2010  . Pain in joint 11/26/2009  . 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 Hypothyroidism 12/08/2006  . MACULAR DEGENERATION 12/08/2006  . Essential hypertension 12/08/2006  . Diaphragmatic hernia 12/08/2006  . CRI (chronic renal insufficiency), stage 3 (moderate) 12/08/2006  . ASPLENIA 12/08/2006  . 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  . 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) 12/22/1994    Class: History of  . 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; now atretic and significantly improved LAD lesion without intervention 12/22/1994    Class: History of    Jule Ser, PT 09/25/2019, 2:08 PM  [facility] [address]  [place], [place], [address] Phone: [phone]   Fax:  [phone]  Name: [name] MRN: [id number] Date of Birth: 08/08/1940

## 2019-09-26 ENCOUNTER — Ambulatory Visit: Payer: Medicare PPO | Admitting: Internal Medicine

## 2019-09-26 ENCOUNTER — Encounter: Payer: Self-pay | Admitting: Internal Medicine

## 2019-09-26 ENCOUNTER — Ambulatory Visit (INDEPENDENT_AMBULATORY_CARE_PROVIDER_SITE_OTHER): Payer: Medicare PPO

## 2019-09-26 DIAGNOSIS — R059 Cough, unspecified: Secondary | ICD-10-CM

## 2019-09-26 DIAGNOSIS — R05 Cough: Secondary | ICD-10-CM

## 2019-09-26 DIAGNOSIS — J449 Chronic obstructive pulmonary disease, unspecified: Secondary | ICD-10-CM

## 2019-09-26 DIAGNOSIS — R058 Other specified cough: Secondary | ICD-10-CM

## 2019-09-26 MED ORDER — GABAPENTIN 100 MG PO CAPS
100.0000 mg | ORAL_CAPSULE | Freq: Three times a day (TID) | ORAL | 2 refills | Status: DC
Start: 2019-09-26 — End: 2019-12-04

## 2019-09-26 NOTE — Progress Notes (Signed)
Subjective:   Patient ID: [name], female    DOB: 16-Mar-1941     MRN: [id number]     Brief patient profile:  79  yowf  Only smoked age 79 - 79  s obvious sequelae with new cough while on a cruise in September 2016 in [place] > cxr > ct chest > PET > referred to pulmonary clinic 02/15/2015 by Dr  Philis Fendt [facility] with GOLD I copd 02/19/15     History of Present Illness  02/15/2015 1st [facility] office visit/ [name]   Chief Complaint  Patient presents with  . PULMONARY CONSULT    Pulmonary nodules. Pt reports some dyspnea with exertion, some morning dry to wet cough. Pt does not use any inhalers.   Cough better but some cc am congestion x years ? Worse in winter > total ? Usually swallows so can't say color or amt assoc with mild pnds  Doe only with exertion = MMRC1 = can walk nl pace, flat grade, can't hurry or go uphills or steps s mild sob  rec Please remember to go to the lab  department downstairs for your tests - we will call you with the results when they are available. 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 Follow-up    Coughing more and having more PND. Cough is non prod. She is not using her albuterol inhaler.   after stirs has  lots of nonproductive coughing assoc with sense of pnds  no better on anoro,  Not limited by breathing from desired activities   rec Stop anoro and montelukast to see what difference this makes For drainage / throat tickle try take CHLORPHENIRAMINE  4 mg - take one every 4 hours as needed -  available over the counter-   - late add:  pred x 6 days prn flare x one - consider adding gabapentin next ov    09/26/2016  f/u ov/[name] re: GOLD I copd [name] / no worse breathing or coughing off anoro/montelukast but maint on nasacort  Chief Complaint  Patient presents with  . Follow-up    Breathing is doing well and she is not coughing much. No new co's.  doe = MMRC1 = can walk nl pace, flat grade, can't hurry or go uphills or steps s sob   Only a little drainage p one chlorpheniramine in am and double strength at hs and sleep fine now rec Continue protonix and pepcid for another 2 months then fine to try to taper  off by stopping the protonix first and changing the pepcid 20 mg twice daily then taper the pepcid off as well and see if cough flares or not - if does flare, restart and you may need GI evaluation  For drainage / throat tickle try take CHLORPHENIRAMINE  4 mg - take one every 4 hours as needed - available over the counter- may cause drowsiness so start with just a bedtime dose or two and see how  you tolerate it before trying in daytime   If not satisfied we start you on gabapentin 100 mg three times daily     10/08/17  Excisional bx LUL c/w nec granuloma      12/03/2017 acute extended ov/[name] re: re-establish  sob gradually downhill x one year then much worse since June 2019 Chief Complaint  Patient presents with  . Acute Visit    Increased cough since June- prod but unsure sputum color. She states her breathing has been gradually worse since her last visit May 2019.   still using chlorpheniramine  And still sleeping fine but wakes up feeling very congested at usual hour and then all during the day has same sensation but not coughing up excess/purulent mucus Doe gradually worse x one year  = MMRC2 = can't walk a nl pace on a flat grade s sob but does fine slow and flat  Arthritis good control on arava  rec For drainage / throat tickle try take CHLORPHENIRAMINE  4 mg - take  one every 4 hours as needed - available over the counter- may cause drowsiness so start with just a bedtime dose or two and see how you tolerate it before trying in daytime   Pantoprazole (protonix) 40 mg   Take  30-60 min before first meal of the day and Pepcid (famotidine)  20 mg one @  bedtime until return to office - this is the best way to tell whether stomach acid is contributing to your problem.   GERD diet  Pl   Add if cough/sob worse > Prednisone 10 mg take  4 each am x 2 days,   2 each am x 2 days,  1 each am x 2 days and stop       01/14/2018  f/u ov/[name] re:  GOLD I copd with reversible component . Did not take the prednisone  Chief Complaint  Patient presents with  . Follow-up    PFT's done today. Her cough and SOB are about the same. No new co's.   Dyspnea:  MMRC1 = can walk nl pace, flat grade, can't hurry or go uphills or steps s sob   Cough: more of throat clearing/ daytime x years / p  rx fosfamax >>  worse since then  Sleeping: flat bed / one pillow SABA use: none 02: none  rec Start BREO 235 one click each am x 2 drags each am  Try off fosfamax for now  Please schedule a follow up office visit in 6 weeks, call sooner if needed     02/25/2018  f/u ov/[name] re: COPD GOLD 1/ maint breo and no fosfamax since last ov  Chief Complaint  Patient presents with  . Follow-up    Breathing is unchanged. She does not have a rescue inhaler.   Dyspnea:   MMRC1 = can walk nl pace, flat grade, can't hurry or go uphills or steps s sob   Cough: no longer  Coughing/ not  Much throat clearing  Sleeping: bed is flat/ one pillow rec Ok to start back on fosfamax with no other changes for now to see what difference it make but if upper airway symptoms are worse you will need alternative (Recast/ prolia)  Remember to brush your teeth and tongue with [employer] based toothpaste and if not better we need to see you back for a different inhaler (you will need to bring your drug formulary  with you )     04/30/2018  f/u ov/[name] re:  Copd GOLD I/ maint breo/ back on fosfamax > more throat clearing despite rx for gerd Chief Complaint  Patient presents with  . Follow-up    Breathing is unchanged. No new co's. She does not have a rescue inhaler.   Dyspnea:  Still mmrc1  Cough: min throat clearing worse on fosfamax  Sleeping: bed is flat/ one or two pillows SABA use: none  02: none rec Try off fosfamax now  to see if throat clearing is better and if it is you Prolia or Reclast as options If not better after 3 months, bring your formulary with you and we'll try you on a different inhaler- try off the breo for a week prior to the visit you can   02/06/2019  f/u ov/[name] re:  Gradually downhill back on biphosphonates /Breo with only GOLD I criteria previously  Chief Complaint  Patient presents with  . Follow-up    Breathing is no better since last visit. She is getting SOB with exertion such as just standing up. She rarely uses her albuterol inhaler, does not feel it helps. She has non prod cough.   Dyspnea:  MMRC3 = can't walk 100 yards even at a slow pace at a flat grade s stopping due to sob   Cough: worse in fall and winter but day > noct with sensation of globus Sleeping: flat / one pillow no resp symptoms noct  SABA use: rarely, not helping  02: none  rec Pantoprazole (protonix) 40 mg   Take  30-60 min before first meal of the day and Pepcid (famotidine)  20 mg one after supper  until return to office - this is the best way to tell whether stomach acid is contributing to your problem and stop the risedronate for now  GERD diet  Prednisone 10 mg take  4 each am x 2 days,   2 each am x 2 days,  1 each am x 2 days and stop  Stop breo and start symbicort 80 Take 2 puffs first thing in am and then another 2 puffs about 12 hours later      02/21/2019  f/u ov/[name] re: GOLD I copd, worsening sob  Chief Complaint  Patient presents with  . Follow-up    Breathing is  unchanged. She rarely uses her albuterol inhaler.   Dyspnea:  Room to room but also limited by L hip pain  Cough: all day sensation of globus but no mucus x years  Sleeping: flat/ one pillow  SABA use: used once and no better  02: none rec Stop symbicort to see what difference if any it makes Gabapentin is 100 mg three times daily  Stop losartan and start metaprolol 25 mg twice daily          03/20/2019  f/u ov/[name] FU:XNAT I copd/  cough improved p last ov  X 2 weeks then pred started and ex tol improved also Chief Complaint  Patient presents with  . Follow-up    Breathing has improved since the last visit. She is on pred taper now per Dr Lenna Gilford.   Dyspnea:  Much better walking around friends homes x 10 min s stopping slow pace Cough: globus better but not gone  Sleeping: fine resp wise SABA use: none  02: none  rec Increase Lopressor 50 mg twice daily  Gabapentin 100 mg three times a day - call if throat discomfort is not improving to adjust up to maximum of 300 mg three  times a day    09/26/2019  f/u ov/[name] re: GOLD I copd/recurrent cough Jaynie Bream off gabapentin  Chief Complaint  Patient presents with  . Follow-up    pt states sob all times.pt states not using rescue inhaler  Dyspnea:  One hall at a time  Then has to stop at [facility]  Cough: worse off gabapentin  Then ran out of h1 and flared / more yellow /productive in am/ convinced it's due to "drainage"    Sleeping: denies resp symptoms   Waking her  up  SABA use: doesn't help 02: no     No obvious day to day or daytime variability or assoc   mucus plugs or hemoptysis or cp or chest tightness, subjective wheeze or overt   hb symptoms.   96 without nocturnal  or early am exacerbation  of respiratory  c/o's or need for noct saba. Also denies any obvious fluctuation of symptoms with weather or environmental changes or other aggravating or alleviating factors except as outlined above   No unusual exposure hx or h/o  childhood pna/ asthma or knowledge of premature birth.  Current Allergies, Complete Past Medical History, Past Surgical History, Family History, and Social History were reviewed in [employer] record.  ROS  The following are not active complaints unless bolded Hoarseness, sore throat, dysphagia globus sensation  dental problems, itching, sneezing,  nasal congestion or discharge of excess mucus or purulent secretions, ear ache,   fever, chills, sweats, unintended wt loss or wt gain, classically pleuritic or exertional cp,  orthopnea pnd or arm/hand swelling  or leg swelling, presyncope, palpitations, abdominal pain, anorexia, nausea, vomiting, diarrhea  or change in bowel habits or change in bladder habits, change in stools or change in urine, dysuria, hematuria,  rash, arthralgias, visual complaints, headache, numbness, weakness or ataxia or problems with walking or coordination,  change in mood or  memory.        Current Meds  Medication Sig  . acetaminophen (TYLENOL) 500 MG tablet Take 500 mg by mouth every 6 (six) hours as needed (pain).   [name] amLODipine (NORVASC) 5 MG tablet Take 1 tablet (5 mg total) by mouth daily.  [name] atorvastatin (LIPITOR) 80 MG tablet Take 1 tablet (80 mg total) by mouth daily.  . Chlorpheniramine Maleate (CHLORPHEN PO) Take by mouth.  . Cholecalciferol (VITAMIN D3) 50 MCG (2000 UT) capsule Take 1 capsule (2,000 Units total) by mouth daily.  . clopidogrel (PLAVIX) 75 MG tablet Take 1 tablet (75 mg total) by mouth daily.  . famotidine (PEPCID) 20 MG tablet One at bedtime  . hydrochlorothiazide (HYDRODIURIL) 12.5 MG tablet Take 1 tablet (12.5 mg total) by mouth daily.  [name] leflunomide (ARAVA) 20 MG tablet Take 20 mg by mouth daily.   [name] levothyroxine (SYNTHROID) 88 MCG tablet Take 1 tablet (88 mcg total) by mouth daily before breakfast.  . loperamide (IMODIUM A-D) 2 MG tablet Take 2 mg by mouth 4 (four) times daily as needed for diarrhea or loose stools.   [name] losartan (COZAAR) 100 MG tablet Take 0.5 tablets (50 mg total) by mouth daily.  . metoprolol tartrate (LOPRESSOR) 50 MG tablet Take 1.5 tablets (75 mg total) by mouth 2 (two) times daily.  . pantoprazole (PROTONIX) 40 MG tablet Take 1 tablet (40 mg total) by mouth daily. Take 30-60 min before first meal of the day  . psyllium (METAMUCIL) 58.6 % packet Take 1 packet by mouth daily.  [name] triamcinolone (NASACORT ALLERGY 24HR) 55  MCG/ACT AERO nasal inhaler Place 1 spray into the nose daily.               Objective:   Physical Exam  amb somber slt hoarse wf nad    09/26/2019          146  03/20/2019       151  02/21/2019       148 02/06/2019         148 04/30/2018      148 01/14/2018        144  12/03/2017          147  09/26/2016        141  08/07/2016          146  06/19/2016        152  05/22/2016        148   02/15/15 155 lb 6.4 oz (70.489 kg)  01/29/15 154 lb (69.854 kg)  11/26/14 153 lb 8 oz (69.627 kg)      Vital signs reviewed  09/26/2019  - Note at rest 02 sats  96% on RA    HEENT : pt wearing mask not removed for exam due to covid - 19 concerns.   NECK :  without JVD/Nodes/TM/ nl carotid upstrokes bilaterally   LUNGS: no acc muscle use,  Min barrel  contour chest wall with bilateral  slightly decreased bs s audible wheeze and  without cough on insp or exp maneuvers and min  Hyperresonant  to  percussion bilaterally     CV:  RRR  no s3 or murmur or increase in P2, and no edema   ABD:  soft and nontender with pos end  insp Hoover's  in the supine position. No bruits or organomegaly appreciated, bowel sounds nl  MS:   Nl gait/  ext warm without deformities, calf tenderness, cyanosis or clubbing No obvious joint restrictions   SKIN: warm and dry without lesions    NEURO:  alert, approp, nl sensorium with  no motor or cerebellar deficits apparent.        CXR PA and Lateral:   09/26/2019 :    I personally reviewed images and  impression as follows:   Copd changes,  nothing acute                       Assessment & Plan:

## 2019-09-26 NOTE — Patient Instructions (Addendum)
Gabapentin 100 mg three times a day and if not satisfied with the cough and sensation of too much throat mucus    We will call to schedule a sinus CT   Please remember to go to the  x-ray department  for your tests - we will call you with the results when they are available   Please schedule a follow up office visit in 8 weeks, sooner if needed

## 2019-09-26 NOTE — Assessment & Plan Note (Signed)
Onset around 2008 - Allergy profile 05/22/2016 >  Eos 0.3 /  IgE  10 neg rast - singulair / gerd rx started 05/22/2016 >>> still with pnds 06/19/2016 > add dymista / 1st gen H1> no better but never took h1 so rec stop dymista  - no better 08/07/2016 on singulair/ anoro > rec stop both and just use prn saba/ h1 > improved 09/26/2016  - restarted gerd rx and 1st gen H1 blockers per guidelines  12/03/2017  - rec d/c fosfamax x 6 weeks 01/14/2018 > resolved 02/25/2018 so ok to rechallenge with fosfamax 02/25/2018 and flared again at ov 04/30/2018 so rec substitute prolia or Reclast  - 02/06/2019 flared again on biphosphonates > d/c permanently, consider reclast or prolia  - 02/21/2019 added gabapentin 100 tid for globus and stop losartan >>>>  50% better 03/20/2019 so rec titrate up to 300 mg tid if tolerates - Sinus CT 09/26/2019 >>>   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   These are the same pts (now being labeled as having "irritable larynx syndrome" by some cough centers) who not infrequently have a history of having failed to tolerate ace inhibitors,  dry powder inhalers or biphosphonates or report having atypical/extraesophageal reflux symptoms that don't respond to standard doses of PPI  and are easily confused as having aecopd or asthma flares by even experienced allergists/ pulmonologists (myself included).   Of the three most common causes of  Sub-acute / recurrent or chronic cough, only one (GERD)  can actually contribute to/ trigger  the  other two (asthma and post nasal drip syndrome)  and perpetuate the cylce of cough.  While not intuitively obvious, many patients with chronic low grade reflux do not cough until there is a primary insult that disturbs the protective epithelial barrier and exposes sensitive nerve endings.   This is typically viral but can due to PNDS and  either may apply here.   The point is that once this occurs, it is difficult to eliminate the cycle  using anything but a maximally effective acid suppression regimen at least in the short run, accompanied by an appropriate diet to address non acid GERD and control / eliminate the cough itself with gabapentin titrated up to 300 mg qid max and continue also 1st gen H1 blockers per guidelines  While awaiting sinus CT next

## 2019-09-26 NOTE — Assessment & Plan Note (Addendum)
Quit smoking in her 17s - PFT's  02/19/15   FEV1 1.78 (90 % ) ratio 66  p 12 % improvement from saba p nothing  prior to study with DLCO  71 % corrects to 78 % for alv volume  And classical curvature  - Spirometry 06/19/2016  FEV1 1.65  (84%)  Ratio 69  With no prior rx  - PFT's  01/14/2018  FEV1 1.68 (90 % ) ratio 68  p 18 % improvement from saba p nothing  prior to study with DLCO  84 % corrects to 90  % for alv volume  - 01/14/2018    try BREO 100 each am > worse 02/06/2019 so d/c'd - 02/06/2019  After extensive coaching inhaler device,  effectiveness =   75% with hfa > change to symb 80 2bid and add spacer next ov if uacs component not improved  - 02/06/2019   Walked RA x one lap =  approx 250 ft - stopped due to  Sob with sats 95% avg pace  - 02/21/2019   Walked RA  2 laps @  approx 269ft each @ slow pace  stopped due to tired more than sob / sats 90%    09/26/2019   Walked RA 3 laps @ approx 222ft each @  Slow pace  stopped due sob several times  with sats 97% or higher  Clear on exam with no desats so suspect this is all due to UACS which was the case previously and has not responded to bronchodilators clinically so will focus on UACS 1st (see separate a/p)    I had an extended discussion with the patient and reviewed all relevant studies  directly observed portions of ambulatory 02 saturation study/ so total time was 30 minutes with moderate level of MDM.  Each maintenance medication was reviewed in detail including most importantly the difference between maintenance and prns and under what circumstances the prns are to be triggered using an action plan format that is not reflected in the computer generated alphabetically organized AVS.     Please see AVS for specific instructions unique to this visit that I personally wrote and verbalized to the the pt in detail and then reviewed with pt  by my nurse highlighting any  changes in therapy recommended at today's visit to their plan of care.

## 2019-09-30 DIAGNOSIS — R2689 Other abnormalities of gait and mobility: Secondary | ICD-10-CM | POA: Diagnosis not present

## 2019-09-30 DIAGNOSIS — M545 Low back pain: Secondary | ICD-10-CM | POA: Diagnosis not present

## 2019-09-30 DIAGNOSIS — M7062 Trochanteric bursitis, left hip: Secondary | ICD-10-CM | POA: Diagnosis not present

## 2019-10-01 NOTE — Progress Notes (Signed)
ATC, NA and no option to leave msg, WCB

## 2019-10-02 ENCOUNTER — Other Ambulatory Visit: Payer: Self-pay

## 2019-10-02 ENCOUNTER — Ambulatory Visit: Payer: Medicare PPO | Attending: Gastroenterology | Admitting: Physical Therapy

## 2019-10-02 DIAGNOSIS — M6281 Muscle weakness (generalized): Secondary | ICD-10-CM | POA: Diagnosis not present

## 2019-10-02 DIAGNOSIS — R279 Unspecified lack of coordination: Secondary | ICD-10-CM | POA: Insufficient documentation

## 2019-10-02 NOTE — Therapy (Signed)
[facility] [address] [place], [place], [address] Phone: [phone]   Fax:  [phone]  Physical Therapy Treatment  Patient Details  Name: [name] MRN: [id number] Date of Birth: [birth year] Referring Provider (PT): Armbruster, Carlota Raspberry, MD   Encounter Date: 10/02/2019  PT End of Session - 10/02/19 1308    Visit Number  8    Date for PT Re-Evaluation  10/30/19    PT Start Time  1233    PT Stop Time  1315    PT Time Calculation (min)  42 min    Activity Tolerance  Patient tolerated treatment well    Behavior During Therapy  [facility] for tasks assessed/performed       Past Medical History:  Diagnosis Date  . Abdominal aortic aneurysm (HCC)    REPAIRED IN 1996 BY [name]  AND HAS RECENTLY BEEN FOLLOWED BY [name]  . 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 Adenomatous colon polyp    tubular  . Anemia   . CAD in native artery 1996, 2002, 2005    Status post CABG x1 with LIMA-LAD for ostial LAD 90% stenosis --> down to 50% in 2002 and 30% in 2005.;  Atretic LIMA; Myoview 06/2010: Fixed anteroseptal, apical and inferoapical defect with moderate size. Most likely scar. Mild subendocardial ischemia. EF 71% LOW RISK.   [name] Cervical disc disease    fracture  . Chronic kidney disease   . COPD (chronic obstructive pulmonary disease) ([place])   . Diverticulosis   . Hyperlipidemia   . Hypertension   . Hypothyroidism (acquired)    hypo  . Myocardial infarction [facility]) Aug. 2016   TIA  . Polymyalgia rheumatica (HCC)    2011 Dr. Charlestine Night  . Rheumatoid arthritis ([place]) 2011   Dr.Truslow; fracture knees, hands and wrists -   . S/P CABG x 1 1996   CABG--LIMA-LAD for ostial LAD (not felt to be PCI amenable). EF NORMAL then; LIMA now atretic  . Shortness of breath dyspnea    with exertion  . Stroke [facility]) 11-2014   TIA   . Urinary frequency     Past  Surgical History:  Procedure Laterality Date  . ABDOMINAL AORTIC ANEURYSM REPAIR  2800   Complicated by mesenteric artery stenosis and splenic artery infarction with acquired Asplenia  . APPENDECTOMY    . BUNIONECTOMY  07/2011   right foot  . CARDIAC CATHETERIZATION  2005   (Most recent CATH) - ostial LAD lesion 20-30% (down from 90% initially). Atretic LIMA. Minimal disease the RCA and Circumflex system.  [name] CARPAL TUNNEL RELEASE Left   . CATARACT EXTRACTION Bilateral   . CERVICAL SPINE SURGERY     plate 2008 Dr. Saintclair Halsted  . [place]   INCLUDED AN INTERNAL MAMMARY ARTERY TO THE LAD. EF WAS NORMAL  . INGUINAL HERNIA REPAIR Right   . LAPAROSCOPIC APPENDECTOMY N/A 06/28/2016   Procedure: APPENDECTOMY LAPAROSCOPIC;  Surgeon: Leighton Ruff, MD;  [facility];  Service: General;  Laterality: N/A;  . NM MYOVIEW LTD  March 2012   Fixed anteroseptal, apical and inferoapical defect with moderate size. Most likely scar. Mild subendocardial ischemia. EF 71% LOW RISK.   [name] SPLENECTOMY    . TRANSTHORACIC ECHOCARDIOGRAM  12/2014   [facility]: Normal LV size & function. EF 55-60%,   . vagina polyp    .  VIDEO ASSISTED THORACOSCOPY (VATS)/WEDGE RESECTION Left 03/02/2015   Procedure: VIDEO ASSISTED THORACOSCOPY (VATS), MINI THORACOTOMY, LEFT UPPER LOBE WEDGE, TAKE DOWN OF INTERNAL MAMMARY LESIONS, PLACEMENT OF ON-Q PUMP;  Surgeon: Grace Isaac, MD;  Location: [place];  Service: Thoracic;  Laterality: Left;  [name] VIDEO BRONCHOSCOPY N/A 03/02/2015   Procedure: BRONCHOSCOPY;  Surgeon: Grace Isaac, MD;  Location: [place];  Service: Thoracic;  Laterality: N/A;  . VIDEO BRONCHOSCOPY WITH ENDOBRONCHIAL NAVIGATION N/A 10/08/2017   Procedure: VIDEO BRONCHOSCOPY WITH ENDOBRONCHIAL NAVIGATION WITH BIOPSIES OF LEFT UPPER LOBE AND LEFT LOWER LOBE;  Surgeon: Grace Isaac, MD;  Location: [place];  Service: Thoracic;  Laterality: N/A;    There were no vitals filed for this  visit.  Subjective Assessment - 10/02/19 1242    Subjective  I didn't have an accident last week.    Patient Stated Goals  feel like I am emptying and not have to bear down as much                     Pelvic Floor Special Questions - 10/02/19 0001    Pelvic Floor Internal Exam  pt informed and identity confirmed for internal assessment for tactile cues with contracting and lifting    Palpation  palpation demonstrates bearing down and bulge when attempts to lift and tighten - needs mod/max cueing    Strength  Flicker    Strength # of seconds  1        [facility] Adult PT Treatment/Exercise - 10/02/19 0001      Neuro Re-ed    Neuro Re-ed Details   TC and quick stretch      Lumbar Exercises: Seated   Other Seated Lumbar Exercises  kegel with ball squeeze      Lumbar Exercises: Supine   Ab Set  5 reps   with kegel   Pelvic Tilt  5 reps   with kegel   Bridge with [employer] Limitations  ball squeeze no bridge; TC with kegel - cues for timing and slowing down pace of reps               PT Short Term Goals - 09/11/19 1236      PT SHORT TERM GOAL #1   Title  25% reduction in leakage    Baseline  50% better    Status  Achieved      PT SHORT TERM GOAL #2   Title  ind with initial HEP    Status  Achieved      PT SHORT TERM GOAL #3   Title  able to correctly engage and activate pelvic floor without bearing down    Baseline  was able to do this today    Status  Achieved        PT Long Term Goals - 10/02/19 1238      PT LONG TERM GOAL #1   Title  Pt will report she is having less frequent BM due to able to greater pelvic floor strength and endurance    Baseline  about half the time BM is normal; half the time I go about 4x and the stool is not normal (liquid or pebbles), goes with a crampy feeling in stomach    Time  4    Period  Weeks    Status  Partially Met    Target Date  10/30/19      PT LONG TERM GOAL #2   Title  ind with  advanced HEP    Time   4    Period  Weeks    Status  On-going    Target Date  10/30/19      PT LONG TERM GOAL #3   Title  Pt will report 50% reduction in leakage    Baseline  very tiny bit and not every day; 50% improved    Time  4    Period  Weeks    Status  Achieved      PT LONG TERM GOAL #4   Title  Pt will demonstrate ability to contract pelvic floor with at least 2/5 MMT and hold for 10 seconds without holding her breath in order to perform functional tasks without leakage    Baseline  1/5 for 1 sec    Time  4    Period  Weeks    Status  On-going    Target Date  10/30/19            Plan - 10/02/19 1358    Clinical Impression Statement  Pt has made progress towards goals with less leakage and less difficutly with BM about 50% of the time.  She continues to have a very hard time with pelvic control and being able to differentiate lifting vs bulging.  She did better with TC and quick stretching as well as isometric hip abduction with ball squeeze.  Pt will benefit from several more sessions to continue working on pelvic floor strength and endurance.    PT Treatment/Interventions  ADLs/Self Care Home Management;Biofeedback;Cryotherapy;Electrical Stimulation;Manual techniques;Neuromuscular re-education;Therapeutic exercise;Therapeutic activities;Patient/family education;Dry needling;Taping;Moist Heat;Passive range of motion    PT Next Visit Plan  tactile cues to ensure she is not bearing down; f/u on pressure to anterior pelvic floor during BM    PT Home Exercise Plan  Access Code: [email or web address]    Consulted and Agree with Plan of Care  Patient       Patient will benefit from skilled therapeutic intervention in order to improve the following deficits and impairments:  Impaired tone, Decreased strength, Postural dysfunction, Decreased coordination, Decreased range of motion, Increased muscle spasms  Visit Diagnosis: Muscle weakness (generalized) - Plan: PT plan of care cert/re-cert  Unspecified lack of  coordination - Plan: PT plan of care cert/re-cert     Problem List Patient Active Problem List   Diagnosis Date Noted  . Ankle swelling 08/08/2019  . Hyperkalemia 05/15/2019  . Osteoporosis 02/26/2018  . Colon polyp 06/28/2016  . COPD GOLD I 05/28/2016  . Upper airway cough syndrome 05/22/2016  . Acute respiratory failure with hypoxemia ([place]) 04/16/2016  . Acute respiratory failure with hypoxia ([place]) 04/15/2016  . Acute bronchitis 04/03/2016  . Wrist pain, right 12/14/2015  . Lung mass 03/02/2015  . Pre-operative cardiovascular examination 02/21/2015  . DOE (dyspnea on exertion) 02/16/2015  . Solitary pulmonary nodule 02/15/2015  . TIA (transient ischemic attack) 12/31/2014  . Diarrhea 11/26/2014  . Well adult exam 11/26/2014  . Seasonal allergies 12/31/2013  . Splenic artery aneurysm ([place]) 04/07/2013  . Dyslipidemia, goal LDL below 70   . Pernicious anemia 09/04/2012  . 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 Long term (current) use of anticoagulants 11/08/2010  . Rheumatoid arthritis ([place]) 06/23/2010  . Pain in joint 11/26/2009  . 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 Hypothyroidism 12/08/2006  . MACULAR DEGENERATION 12/08/2006  . Essential hypertension 12/08/2006  . Diaphragmatic hernia 12/08/2006  . CRI (chronic renal insufficiency), stage 3 (moderate) 12/08/2006  . ASPLENIA 12/08/2006  . Hx of CABG  01/01/1995  . 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) 12/22/1994    Class: History of  . 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; now atretic and significantly improved LAD lesion without intervention 12/22/1994    Class: History of    Jule Ser, PT 10/02/2019, 2:11 PM  [facility] [address], [place], [place], [address] Phone: [phone]   Fax:  [phone]  Name: IVEE POELLNITZ MRN: [id number] Date of Birth: [birth year]

## 2019-10-03 NOTE — Progress Notes (Signed)
Provided CXR results to patient.  Patient verbalized understanding.

## 2019-10-04 ENCOUNTER — Other Ambulatory Visit: Payer: Self-pay | Admitting: Adult Health

## 2019-10-06 ENCOUNTER — Other Ambulatory Visit: Payer: Self-pay | Admitting: Internal Medicine

## 2019-10-06 MED ORDER — METOPROLOL TARTRATE 50 MG PO TABS: 75.0000 mg | ORAL_TABLET | Freq: Every day | ORAL | 2 refills | Status: DC

## 2019-10-07 ENCOUNTER — Ambulatory Visit (INDEPENDENT_AMBULATORY_CARE_PROVIDER_SITE_OTHER)
Admission: RE | Admit: 2019-10-07 | Discharge: 2019-10-07 | Disposition: A | Discharge status: Home / Self Care | Payer: Medicare PPO | Source: Ambulatory Visit | Attending: Internal Medicine | Admitting: Internal Medicine

## 2019-10-07 ENCOUNTER — Other Ambulatory Visit: Payer: Self-pay

## 2019-10-07 ENCOUNTER — Other Ambulatory Visit: Payer: Self-pay | Admitting: Internal Medicine

## 2019-10-07 DIAGNOSIS — R058 Other specified cough: Secondary | ICD-10-CM

## 2019-10-07 DIAGNOSIS — R05 Cough: Secondary | ICD-10-CM | POA: Diagnosis not present

## 2019-10-07 DIAGNOSIS — M7062 Trochanteric bursitis, left hip: Secondary | ICD-10-CM | POA: Diagnosis not present

## 2019-10-07 DIAGNOSIS — R2689 Other abnormalities of gait and mobility: Secondary | ICD-10-CM | POA: Diagnosis not present

## 2019-10-07 DIAGNOSIS — M545 Low back pain: Secondary | ICD-10-CM | POA: Diagnosis not present

## 2019-10-07 DIAGNOSIS — R059 Cough, unspecified: Secondary | ICD-10-CM

## 2019-10-07 MED ORDER — B-12 2500 MCG PO TABS: ORAL_TABLET | ORAL | 3 refills | Status: DC

## 2019-10-08 NOTE — Progress Notes (Signed)
Spoke with pt and notified of results per Dr. Wert. Pt verbalized understanding and denied any questions. 

## 2019-10-09 ENCOUNTER — Other Ambulatory Visit: Payer: Self-pay

## 2019-10-09 ENCOUNTER — Ambulatory Visit: Payer: Medicare PPO | Admitting: Physical Therapy

## 2019-10-09 DIAGNOSIS — R279 Unspecified lack of coordination: Secondary | ICD-10-CM

## 2019-10-09 DIAGNOSIS — M6281 Muscle weakness (generalized): Secondary | ICD-10-CM | POA: Diagnosis not present

## 2019-10-09 NOTE — Patient Instructions (Signed)
Access Code: [email or web address] URL: [email or web address] Date: 10/09/2019 Prepared by: Jari Favre  Exercises Supine Lower Trunk Rotation - 1 x daily - 7 x weekly - 10 reps - 3 sets Supine Upper Trunk and Cervical Rotation with Opposite Lower Trunk Rotation - 1 x daily - 7 x weekly - 10 reps - 1 sets Thoracic Extension Mobilization with Noodle - 1 x daily - 7 x weekly - 1 sets - 10 reps Supine Double Knee to Chest - 1 x daily - 7 x weekly - 10 reps - 3 sets Supine Diaphragmatic Breathing - 3 x daily - 7 x weekly - 1 sets - 10 reps Supine Hip Adductor Squeeze with Small Ball - 3 x daily - 7 x weekly - 10 reps - 1 sets - 3 sec hold Supine Bridge with Mini Swiss Ball Between Knees - 1 x daily - 7 x weekly - 10 reps - 3 sets Sidelying Reverse Clamshell - 1 x daily - 7 x weekly - 1 sets - 10 reps Hooklying Small March - 1 x daily - 7 x weekly - 10 reps - 2 sets Standing Shoulder Flexion Reactive Isometrics with Elbow Extended - 1 x daily - 7 x weekly - 10 reps - 3 sets

## 2019-10-09 NOTE — Therapy (Signed)
[facility] [address], [place], [place], [address] Phone: [phone]   Fax:  [phone]  Physical Therapy Treatment  Patient Details  Name: [name] MRN: [id number] Date of Birth: Jan 13, 1941 Referring Provider (PT): Armbruster, Carlota Raspberry, MD   Encounter Date: 10/09/2019   PT End of Session - 10/09/19 1256    Visit Number 9    Date for PT Re-Evaluation 10/30/19    PT Start Time 1229    PT Stop Time 1308    PT Time Calculation (min) 39 min    Activity Tolerance Patient tolerated treatment well    Behavior During Therapy [facility] for tasks assessed/performed           Past Medical History:  Diagnosis Date  . Abdominal aortic aneurysm (HCC)    REPAIRED IN 1996 BY [name]  AND HAS RECENTLY BEEN FOLLOWED BY [name]  . 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 Adenomatous colon polyp    tubular  . Anemia   . CAD in native artery 1996, 2002, 2005    Status post CABG x1 with LIMA-LAD for ostial LAD 90% stenosis --> down to 50% in 2002 and 30% in 2005.;  Atretic LIMA; Myoview 06/2010: Fixed anteroseptal, apical and inferoapical defect with moderate size. Most likely scar. Mild subendocardial ischemia. EF 71% LOW RISK.   [name] Cervical disc disease    fracture  . Chronic kidney disease   . COPD (chronic obstructive pulmonary disease) ([place])   . Diverticulosis   . Hyperlipidemia   . Hypertension   . Hypothyroidism (acquired)    hypo  . Myocardial infarction [facility]) Aug. 2016   TIA  . Polymyalgia rheumatica (HCC)    2011 Dr. Charlestine Night  . Rheumatoid arthritis ([place]) 2011   Dr.Truslow; fracture knees, hands and wrists -   . S/P CABG x 1 1996   CABG--LIMA-LAD for ostial LAD (not felt to be PCI amenable). EF NORMAL then; LIMA now atretic  . Shortness of breath dyspnea    with exertion  . Stroke [facility]) 11-2014   TIA   . Urinary frequency      Past Surgical History:  Procedure Laterality Date  . ABDOMINAL AORTIC ANEURYSM REPAIR  1829   Complicated by mesenteric artery stenosis and splenic artery infarction with acquired Asplenia  . APPENDECTOMY    . BUNIONECTOMY  07/2011   right foot  . CARDIAC CATHETERIZATION  2005   (Most recent CATH) - ostial LAD lesion 20-30% (down from 90% initially). Atretic LIMA. Minimal disease the RCA and Circumflex system.  [name] CARPAL TUNNEL RELEASE Left   . CATARACT EXTRACTION Bilateral   . CERVICAL SPINE SURGERY     plate 2008 Dr. Saintclair Halsted  . [place]   INCLUDED AN INTERNAL MAMMARY ARTERY TO THE LAD. EF WAS NORMAL  . INGUINAL HERNIA REPAIR Right   . LAPAROSCOPIC APPENDECTOMY N/A 06/28/2016   Procedure: APPENDECTOMY LAPAROSCOPIC;  Surgeon: Leighton Ruff, MD;  [facility];  Service: General;  Laterality: N/A;  . NM MYOVIEW LTD  March 2012   Fixed anteroseptal, apical and inferoapical defect with moderate size. Most likely scar. Mild subendocardial ischemia. EF 71% LOW RISK.   [name] SPLENECTOMY    . TRANSTHORACIC ECHOCARDIOGRAM  12/2014   [facility]: Normal LV size & function. EF 55-60%,   . vagina polyp    . VIDEO ASSISTED  THORACOSCOPY (VATS)/WEDGE RESECTION Left 03/02/2015   Procedure: VIDEO ASSISTED THORACOSCOPY (VATS), MINI THORACOTOMY, LEFT UPPER LOBE WEDGE, TAKE DOWN OF INTERNAL MAMMARY LESIONS, PLACEMENT OF ON-Q PUMP;  Surgeon: Grace Isaac, MD;  Location: [place];  Service: Thoracic;  Laterality: Left;  [name] VIDEO BRONCHOSCOPY N/A 03/02/2015   Procedure: BRONCHOSCOPY;  Surgeon: Grace Isaac, MD;  Location: [place];  Service: Thoracic;  Laterality: N/A;  . VIDEO BRONCHOSCOPY WITH ENDOBRONCHIAL NAVIGATION N/A 10/08/2017   Procedure: VIDEO BRONCHOSCOPY WITH ENDOBRONCHIAL NAVIGATION WITH BIOPSIES OF LEFT UPPER LOBE AND LEFT LOWER LOBE;  Surgeon: Grace Isaac, MD;  Location: [place];  Service: Thoracic;  Laterality: N/A;    There were no vitals filed for  this visit.   Subjective Assessment - 10/09/19 1523    Subjective I had one accident after dinner.  It surprised me because I thought I had gone a lot already that day    Patient Stated Goals feel like I am emptying and not have to bear down as much    Currently in Pain? No/denies                             [facility] Adult PT Treatment/Exercise - 10/09/19 0001      Neuro Re-ed    Neuro Re-ed Details  TC for not bulging pelvic floor      Lumbar Exercises: Aerobic   UBE (Upper Arm Bike) L1 2/2 with cues to activate core      Lumbar Exercises: Standing   Other Standing Lumbar Exercises reverse isometric flexion yellow band - 20x    Other Standing Lumbar Exercises red band shoulder ext - core; red band pallof press      Lumbar Exercises: Supine   Ab Set 10 reps   TC for pelvic floor and TrA activated   Bent Knee Raise 10 reps   TC for pelvic floor and TrA activated     Lumbar Exercises: Sidelying   Clam Both;15 reps    Clam Limitations reverse clam                  PT Education - 10/09/19 1256    Education Details Access Code: [email or web address]    Person(s) Educated Patient    Methods Explanation;Demonstration;Tactile cues;Verbal cues;Handout    Comprehension Verbalized understanding;Returned demonstration            PT Short Term Goals - 09/11/19 1236      PT SHORT TERM GOAL #1   Title 25% reduction in leakage    Baseline 50% better    Status Achieved      PT SHORT TERM GOAL #2   Title ind with initial HEP    Status Achieved      PT SHORT TERM GOAL #3   Title able to correctly engage and activate pelvic floor without bearing down    Baseline was able to do this today    Status Achieved             PT Long Term Goals - 10/02/19 1238      PT LONG TERM GOAL #1   Title Pt will report she is having less frequent BM due to able to greater pelvic floor strength and endurance    Baseline about half the time BM is normal; half the time I go about 4x  and the stool is not normal (liquid or pebbles), goes with a crampy feeling in stomach  Time 4    Period Weeks    Status Partially Met    Target Date 10/30/19      PT LONG TERM GOAL #2   Title ind with advanced HEP    Time 4    Period Weeks    Status On-going    Target Date 10/30/19      PT LONG TERM GOAL #3   Title Pt will report 50% reduction in leakage    Baseline very tiny bit and not every day; 50% improved    Time 4    Period Weeks    Status Achieved      PT LONG TERM GOAL #4   Title Pt will demonstrate ability to contract pelvic floor with at least 2/5 MMT and hold for 10 seconds without holding her breath in order to perform functional tasks without leakage    Baseline 1/5 for 1 sec    Time 4    Period Weeks    Status On-going    Target Date 10/30/19                 Plan - 10/09/19 1524    Clinical Impression Statement Pt did okay with exercises today, but still continues to bulge and bear down at times needing VC to let her know.  Pt was given an updated HEP with hip IR to work on more pelvic floor support muscles and she was able to do these without bulging pelvic floor.    PT Treatment/Interventions ADLs/Self Care Home Management;Biofeedback;Cryotherapy;Electrical Stimulation;Manual techniques;Neuromuscular re-education;Therapeutic exercise;Therapeutic activities;Patient/family education;Dry needling;Taping;Moist Heat;Passive range of motion    PT Next Visit Plan tactile cues to ensure she is not bearing down; f/u on pressure to anterior pelvic floor during BM    PT Home Exercise Plan Access Code: [email or web address]    Consulted and Agree with Plan of Care Patient           Patient will benefit from skilled therapeutic intervention in order to improve the following deficits and impairments:  Impaired tone, Decreased strength, Postural dysfunction, Decreased coordination, Decreased range of motion, Increased muscle spasms  Visit Diagnosis: Muscle weakness  (generalized)  Unspecified lack of coordination     Problem List Patient Active Problem List   Diagnosis Date Noted  . Ankle swelling 08/08/2019  . Hyperkalemia 05/15/2019  . Osteoporosis 02/26/2018  . Colon polyp 06/28/2016  . COPD GOLD I 05/28/2016  . Upper airway cough syndrome 05/22/2016  . Acute respiratory failure with hypoxemia ([place]) 04/16/2016  . Acute respiratory failure with hypoxia ([place]) 04/15/2016  . Acute bronchitis 04/03/2016  . Wrist pain, right 12/14/2015  . Lung mass 03/02/2015  . Pre-operative cardiovascular examination 02/21/2015  . DOE (dyspnea on exertion) 02/16/2015  . Solitary pulmonary nodule 02/15/2015  . TIA (transient ischemic attack) 12/31/2014  . Diarrhea 11/26/2014  . Well adult exam 11/26/2014  . Seasonal allergies 12/31/2013  . Splenic artery aneurysm ([place]) 04/07/2013  . Dyslipidemia, goal LDL below 70   . Pernicious anemia 09/04/2012  . 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 Long term (current) use of anticoagulants 11/08/2010  . Rheumatoid arthritis ([place]) 06/23/2010  . Pain in joint 11/26/2009  . 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 Hypothyroidism 12/08/2006  . MACULAR DEGENERATION 12/08/2006  . Essential hypertension 12/08/2006  . Diaphragmatic hernia 12/08/2006  . CRI (chronic renal insufficiency), stage 3 (moderate) 12/08/2006  . ASPLENIA 12/08/2006  . 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  . 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) 12/22/1994  Class: History of  . 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; now atretic and significantly improved LAD lesion without intervention 12/22/1994    Class: History of    Jule Ser, PT 10/09/2019, 3:32 PM  [facility] [address] [place], [place], [address] Phone: [phone]   Fax:  [phone]  Name: [name] MRN: [id number] Date of Birth: 1940/12/30

## 2019-10-14 DIAGNOSIS — M7062 Trochanteric bursitis, left hip: Secondary | ICD-10-CM | POA: Diagnosis not present

## 2019-10-14 DIAGNOSIS — M545 Low back pain: Secondary | ICD-10-CM | POA: Diagnosis not present

## 2019-10-14 DIAGNOSIS — R2689 Other abnormalities of gait and mobility: Secondary | ICD-10-CM | POA: Diagnosis not present

## 2019-10-15 ENCOUNTER — Other Ambulatory Visit: Payer: Self-pay | Admitting: Internal Medicine

## 2019-10-15 MED ORDER — METOPROLOL TARTRATE 50 MG PO TABS: 75.0000 mg | ORAL_TABLET | Freq: Two times a day (BID) | ORAL | 3 refills | Status: DC

## 2019-10-20 ENCOUNTER — Other Ambulatory Visit: Payer: Self-pay

## 2019-10-20 ENCOUNTER — Ambulatory Visit: Payer: Medicare PPO | Admitting: Physical Therapy

## 2019-10-20 ENCOUNTER — Other Ambulatory Visit: Payer: Self-pay | Admitting: Internal Medicine

## 2019-10-20 DIAGNOSIS — M6281 Muscle weakness (generalized): Secondary | ICD-10-CM | POA: Diagnosis not present

## 2019-10-20 DIAGNOSIS — R058 Other specified cough: Secondary | ICD-10-CM

## 2019-10-20 DIAGNOSIS — R279 Unspecified lack of coordination: Secondary | ICD-10-CM | POA: Diagnosis not present

## 2019-10-20 NOTE — Patient Instructions (Signed)
Access Code: [email or web address] URL: [email or web address] Date: 10/20/2019 Prepared by: Jari Favre  Exercises Supine Lower Trunk Rotation - 1 x daily - 7 x weekly - 10 reps - 3 sets Supine Upper Trunk and Cervical Rotation with Opposite Lower Trunk Rotation - 1 x daily - 7 x weekly - 10 reps - 1 sets Thoracic Extension Mobilization with Noodle - 1 x daily - 7 x weekly - 1 sets - 10 reps Supine Double Knee to Chest - 1 x daily - 7 x weekly - 10 reps - 3 sets Supine Diaphragmatic Breathing - 3 x daily - 7 x weekly - 1 sets - 10 reps Supine Hip Adductor Squeeze with Small Ball - 3 x daily - 7 x weekly - 10 reps - 1 sets - 3 sec hold Supine Bridge with Mini Swiss Ball Between Knees - 1 x daily - 7 x weekly - 10 reps - 3 sets Sidelying Reverse Clamshell - 1 x daily - 7 x weekly - 1 sets - 10 reps Hooklying Small March - 1 x daily - 7 x weekly - 10 reps - 2 sets Standing Shoulder Flexion Reactive Isometrics with Elbow Extended - 1 x daily - 7 x weekly - 10 reps - 3 sets Supine Shoulder Flexion with Free Weight - 1 x daily - 7 x weekly - 3 sets - 10 reps Bent Knee Fallouts - 1 x daily - 7 x weekly - 3 sets - 10 reps Supine Posterior Pelvic Tilt - 1 x daily - 7 x weekly - 3 sets - 10 reps

## 2019-10-20 NOTE — Therapy (Signed)
[facility] [address] [place], [place], [address] Phone: [phone]   Fax:  [phone]  Physical Therapy Treatment Progress Note Reporting Period 09/30/19 to 10/20/19   See note below for Objective Data and Assessment of Progress/Goals.      Patient Details  Name: [name] MRN: [id number] Date of Birth: 1940/06/18 Referring Provider (PT): Armbruster, Carlota Raspberry, MD   Encounter Date: 10/20/2019   PT End of Session - 10/20/19 1020    Visit Number 10    Date for PT Re-Evaluation 10/30/19    PT Start Time 1016    PT Stop Time 1057    PT Time Calculation (min) 41 min    Activity Tolerance Patient tolerated treatment well    Behavior During Therapy [facility] for tasks assessed/performed           Past Medical History:  Diagnosis Date  . Abdominal aortic aneurysm (HCC)    REPAIRED IN 1996 BY [name]  AND HAS RECENTLY BEEN FOLLOWED BY [name]  . 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 Adenomatous colon polyp    tubular  . Anemia   . CAD in native artery 1996, 2002, 2005    Status post CABG x1 with LIMA-LAD for ostial LAD 90% stenosis --> down to 50% in 2002 and 30% in 2005.;  Atretic LIMA; Myoview 06/2010: Fixed anteroseptal, apical and inferoapical defect with moderate size. Most likely scar. Mild subendocardial ischemia. EF 71% LOW RISK.   [name] Cervical disc disease    fracture  . Chronic kidney disease   . COPD (chronic obstructive pulmonary disease) ([place])   . Diverticulosis   . Hyperlipidemia   . Hypertension   . Hypothyroidism (acquired)    hypo  . Myocardial infarction [facility]) Aug. 2016   TIA  . Polymyalgia rheumatica (HCC)    2011 Dr. Charlestine Night  . Rheumatoid arthritis ([place]) 2011   Dr.Truslow; fracture knees, hands and wrists -   . S/P CABG x 1 1996   CABG--LIMA-LAD for ostial LAD (not felt to be PCI amenable). EF NORMAL then;  LIMA now atretic  . Shortness of breath dyspnea    with exertion  . Stroke [facility]) 11-2014   TIA   . Urinary frequency     Past Surgical History:  Procedure Laterality Date  . ABDOMINAL AORTIC ANEURYSM REPAIR  1443   Complicated by mesenteric artery stenosis and splenic artery infarction with acquired Asplenia  . APPENDECTOMY    . BUNIONECTOMY  07/2011   right foot  . CARDIAC CATHETERIZATION  2005   (Most recent CATH) - ostial LAD lesion 20-30% (down from 90% initially). Atretic LIMA. Minimal disease the RCA and Circumflex system.  [name] CARPAL TUNNEL RELEASE Left   . CATARACT EXTRACTION Bilateral   . CERVICAL SPINE SURGERY     plate 2008 Dr. Saintclair Halsted  . [place]   INCLUDED AN INTERNAL MAMMARY ARTERY TO THE LAD. EF WAS NORMAL  . INGUINAL HERNIA REPAIR Right   . LAPAROSCOPIC APPENDECTOMY N/A 06/28/2016   Procedure: APPENDECTOMY LAPAROSCOPIC;  Surgeon: Leighton Ruff, MD;  [facility]: [facility];  Service: General;  Laterality: N/A;  . NM MYOVIEW LTD  March 2012   Fixed anteroseptal, apical and inferoapical defect with moderate size. Most likely scar. Mild subendocardial ischemia. EF 71% LOW RISK.   [name] SPLENECTOMY    . TRANSTHORACIC ECHOCARDIOGRAM  12/2014  [facility]: Normal LV size & function. EF 55-60%,   . vagina polyp    . VIDEO ASSISTED THORACOSCOPY (VATS)/WEDGE RESECTION Left 03/02/2015   Procedure: VIDEO ASSISTED THORACOSCOPY (VATS), MINI THORACOTOMY, LEFT UPPER LOBE WEDGE, TAKE DOWN OF INTERNAL MAMMARY LESIONS, PLACEMENT OF ON-Q PUMP;  Surgeon: Grace Isaac, MD;  Location: [place];  Service: Thoracic;  Laterality: Left;  [name] VIDEO BRONCHOSCOPY N/A 03/02/2015   Procedure: BRONCHOSCOPY;  Surgeon: Grace Isaac, MD;  Location: [place];  Service: Thoracic;  Laterality: N/A;  . VIDEO BRONCHOSCOPY WITH ENDOBRONCHIAL NAVIGATION N/A 10/08/2017   Procedure: VIDEO BRONCHOSCOPY WITH ENDOBRONCHIAL NAVIGATION WITH BIOPSIES OF LEFT UPPER LOBE AND LEFT LOWER LOBE;   Surgeon: Grace Isaac, MD;  Location: [place];  Service: Thoracic;  Laterality: N/A;    There were no vitals filed for this visit.   Subjective Assessment - 10/20/19 1018    Subjective No problems over the traveling.    Patient Stated Goals feel like I am emptying and not have to bear down as much                             [facility] Adult PT Treatment/Exercise - 10/20/19 0001      Lumbar Exercises: Aerobic   Nustep L1 x 6 min - done after      Lumbar Exercises: Standing   Heel Raises Limitations rocking on foam mat - cues to activate core more when on heels - 20x    Other Standing Lumbar Exercises reverse isometric flexion red band - 20x    Other Standing Lumbar Exercises marching on foam mat with TrA activated      Lumbar Exercises: Supine   Ab Set 10 reps   TC for pelvic floor and TrA activated   Bent Knee Raise 10 reps   TC for pelvic floor and TrA activated   Bridge with [employer] Limitations clam with green band but was bulging - 5x    Other Supine Lumbar Exercises bent knee fallout; UE flexion with 2lb   TC for pelvic floor and TrA activated                 PT Education - 10/20/19 1058    Education Details Access Code: [email or web address]    Person(s) Educated Patient    Methods Explanation;Demonstration;Verbal cues;Handout    Comprehension Verbalized understanding;Returned demonstration            PT Short Term Goals - 09/11/19 1236      PT SHORT TERM GOAL #1   Title 25% reduction in leakage    Baseline 50% better    Status Achieved      PT SHORT TERM GOAL #2   Title ind with initial HEP    Status Achieved      PT SHORT TERM GOAL #3   Title able to correctly engage and activate pelvic floor without bearing down    Baseline was able to do this today    Status Achieved             PT Long Term Goals - 10/02/19 1238      PT LONG TERM GOAL #1   Title Pt will report she is having less frequent BM due to able to greater pelvic floor  strength and endurance    Baseline about half the time BM is normal; half the time I go about 4x and the stool is not normal (liquid  or pebbles), goes with a crampy feeling in stomach    Time 4    Period Weeks    Status Partially Met    Target Date 10/30/19      PT LONG TERM GOAL #2   Title ind with advanced HEP    Time 4    Period Weeks    Status On-going    Target Date 10/30/19      PT LONG TERM GOAL #3   Title Pt will report 50% reduction in leakage    Baseline very tiny bit and not every day; 50% improved    Time 4    Period Weeks    Status Achieved      PT LONG TERM GOAL #4   Title Pt will demonstrate ability to contract pelvic floor with at least 2/5 MMT and hold for 10 seconds without holding her breath in order to perform functional tasks without leakage    Baseline 1/5 for 1 sec    Time 4    Period Weeks    Status On-going    Target Date 10/30/19                 Plan - 10/20/19 1059    Clinical Impression Statement Pt demonstated some improvements today with her muscle coordination.  Pt was able to engage core and pelvic floor while exhaling in supine.  When given increased resistance in supine she was unable to maintain intra-abdominal pressure so exercises were modified based on her currently level.  Pt has been consistent with reduction of leakage. She will benefit from skilled PT to continue to progress core strength for improved pressure managment of the abdomen.    PT Treatment/Interventions ADLs/Self Care Home Management;Biofeedback;Cryotherapy;Electrical Stimulation;Manual techniques;Neuromuscular re-education;Therapeutic exercise;Therapeutic activities;Patient/family education;Dry needling;Taping;Moist Heat;Passive range of motion    PT Next Visit Plan progress core strength and cues as needed to reduce bulging of abdomen and pelvic floor    PT Home Exercise Plan Access Code: [email or web address]    Consulted and Agree with Plan of Care Patient           Patient  will benefit from skilled therapeutic intervention in order to improve the following deficits and impairments:  Impaired tone, Decreased strength, Postural dysfunction, Decreased coordination, Decreased range of motion, Increased muscle spasms  Visit Diagnosis: Muscle weakness (generalized)  Unspecified lack of coordination     Problem List Patient Active Problem List   Diagnosis Date Noted  . Ankle swelling 08/08/2019  . Hyperkalemia 05/15/2019  . Osteoporosis 02/26/2018  . Colon polyp 06/28/2016  . COPD GOLD I 05/28/2016  . Upper airway cough syndrome 05/22/2016  . Acute respiratory failure with hypoxemia ([place]) 04/16/2016  . Acute respiratory failure with hypoxia ([place]) 04/15/2016  . Acute bronchitis 04/03/2016  . Wrist pain, right 12/14/2015  . Lung mass 03/02/2015  . Pre-operative cardiovascular examination 02/21/2015  . DOE (dyspnea on exertion) 02/16/2015  . Solitary pulmonary nodule 02/15/2015  . TIA (transient ischemic attack) 12/31/2014  . Diarrhea 11/26/2014  . Well adult exam 11/26/2014  . Seasonal allergies 12/31/2013  . Splenic artery aneurysm ([place]) 04/07/2013  . Dyslipidemia, goal LDL below 70   . Pernicious anemia 09/04/2012  . 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 Long term (current) use of anticoagulants 11/08/2010  . Rheumatoid arthritis ([place]) 06/23/2010  . Pain in joint 11/26/2009  . 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 Hypothyroidism 12/08/2006  . MACULAR DEGENERATION 12/08/2006  . Essential hypertension 12/08/2006  . Diaphragmatic hernia 12/08/2006  .  CRI (chronic renal insufficiency), stage 3 (moderate) 12/08/2006  . ASPLENIA 12/08/2006  . 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  . 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) 12/22/1994    Class: History of  . 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; now atretic and significantly improved LAD lesion without intervention 12/22/1994    Class: History of    Jule Ser,  PT 10/20/2019, 11:08 AM  [facility] [address], [place], [place], [address] Phone: [phone]   Fax:  [phone]  Name: [name] MRN: [id number] Date of Birth: 1941-01-07

## 2019-10-21 DIAGNOSIS — M545 Low back pain: Secondary | ICD-10-CM | POA: Diagnosis not present

## 2019-10-21 DIAGNOSIS — R2689 Other abnormalities of gait and mobility: Secondary | ICD-10-CM | POA: Diagnosis not present

## 2019-10-21 DIAGNOSIS — M7062 Trochanteric bursitis, left hip: Secondary | ICD-10-CM | POA: Diagnosis not present

## 2019-10-28 DIAGNOSIS — M545 Low back pain: Secondary | ICD-10-CM | POA: Diagnosis not present

## 2019-10-28 DIAGNOSIS — R2689 Other abnormalities of gait and mobility: Secondary | ICD-10-CM | POA: Diagnosis not present

## 2019-10-28 DIAGNOSIS — M7062 Trochanteric bursitis, left hip: Secondary | ICD-10-CM | POA: Diagnosis not present

## 2019-10-29 DIAGNOSIS — M0579 Rheumatoid arthritis with rheumatoid factor of multiple sites without organ or systems involvement: Secondary | ICD-10-CM | POA: Diagnosis not present

## 2019-10-30 ENCOUNTER — Encounter: Payer: Self-pay | Admitting: Physical Therapy

## 2019-10-30 ENCOUNTER — Other Ambulatory Visit: Payer: Self-pay

## 2019-10-30 ENCOUNTER — Ambulatory Visit: Payer: Medicare PPO | Attending: Gastroenterology | Admitting: Physical Therapy

## 2019-10-30 DIAGNOSIS — R279 Unspecified lack of coordination: Secondary | ICD-10-CM

## 2019-10-30 DIAGNOSIS — M6281 Muscle weakness (generalized): Secondary | ICD-10-CM | POA: Insufficient documentation

## 2019-10-31 NOTE — Therapy (Signed)
[facility] [address] [place], [place], [address] Phone: [phone]   Fax:  [phone]  Physical Therapy Treatment  Patient Details  Name: [name] MRN: [id number] Date of Birth: October 21, 1940 Referring Provider (PT): Armbruster, Carlota Raspberry, MD   Encounter Date: 10/30/2019   PT End of Session - 10/31/19 0803    Visit Number 11    Date for PT Re-Evaluation 10/30/19    PT Start Time 1532    PT Stop Time 1613    PT Time Calculation (min) 41 min    Activity Tolerance Patient tolerated treatment well    Behavior During Therapy [facility] for tasks assessed/performed           Past Medical History:  Diagnosis Date  . Abdominal aortic aneurysm (HCC)    REPAIRED IN 1996 BY [name]  AND HAS RECENTLY BEEN FOLLOWED BY [name]  . 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 Adenomatous colon polyp    tubular  . Anemia   . CAD in native artery 1996, 2002, 2005    Status post CABG x1 with LIMA-LAD for ostial LAD 90% stenosis --> down to 50% in 2002 and 30% in 2005.;  Atretic LIMA; Myoview 06/2010: Fixed anteroseptal, apical and inferoapical defect with moderate size. Most likely scar. Mild subendocardial ischemia. EF 71% LOW RISK.   [name] Cervical disc disease    fracture  . Chronic kidney disease   . COPD (chronic obstructive pulmonary disease) ([place])   . Diverticulosis   . Hyperlipidemia   . Hypertension   . Hypothyroidism (acquired)    hypo  . Myocardial infarction [facility]) Aug. 2016   TIA  . Polymyalgia rheumatica (HCC)    2011 Dr. Charlestine Night  . Rheumatoid arthritis ([place]) 2011   Dr.Truslow; fracture knees, hands and wrists -   . S/P CABG x 1 1996   CABG--LIMA-LAD for ostial LAD (not felt to be PCI amenable). EF NORMAL then; LIMA now atretic  . Shortness of breath dyspnea    with exertion  . Stroke [facility]) 11-2014   TIA   . Urinary frequency      Past Surgical History:  Procedure Laterality Date  . ABDOMINAL AORTIC ANEURYSM REPAIR  3734   Complicated by mesenteric artery stenosis and splenic artery infarction with acquired Asplenia  . APPENDECTOMY    . BUNIONECTOMY  07/2011   right foot  . CARDIAC CATHETERIZATION  2005   (Most recent CATH) - ostial LAD lesion 20-30% (down from 90% initially). Atretic LIMA. Minimal disease the RCA and Circumflex system.  [name] CARPAL TUNNEL RELEASE Left   . CATARACT EXTRACTION Bilateral   . CERVICAL SPINE SURGERY     plate 2008 Dr. Saintclair Halsted  . [place]   INCLUDED AN INTERNAL MAMMARY ARTERY TO THE LAD. EF WAS NORMAL  . INGUINAL HERNIA REPAIR Right   . LAPAROSCOPIC APPENDECTOMY N/A 06/28/2016   Procedure: APPENDECTOMY LAPAROSCOPIC;  Surgeon: Leighton Ruff, MD;  [facility];  Service: General;  Laterality: N/A;  . NM MYOVIEW LTD  March 2012   Fixed anteroseptal, apical and inferoapical defect with moderate size. Most likely scar. Mild subendocardial ischemia. EF 71% LOW RISK.   [name] SPLENECTOMY    . TRANSTHORACIC ECHOCARDIOGRAM  12/2014   [facility]: Normal LV size & function. EF 55-60%,   . vagina polyp    . VIDEO ASSISTED  THORACOSCOPY (VATS)/WEDGE RESECTION Left 03/02/2015   Procedure: VIDEO ASSISTED THORACOSCOPY (VATS), MINI THORACOTOMY, LEFT UPPER LOBE WEDGE, TAKE DOWN OF INTERNAL MAMMARY LESIONS, PLACEMENT OF ON-Q PUMP;  Surgeon: Grace Isaac, MD;  Location: [place];  Service: Thoracic;  Laterality: Left;  [name] VIDEO BRONCHOSCOPY N/A 03/02/2015   Procedure: BRONCHOSCOPY;  Surgeon: Grace Isaac, MD;  Location: [place];  Service: Thoracic;  Laterality: N/A;  . VIDEO BRONCHOSCOPY WITH ENDOBRONCHIAL NAVIGATION N/A 10/08/2017   Procedure: VIDEO BRONCHOSCOPY WITH ENDOBRONCHIAL NAVIGATION WITH BIOPSIES OF LEFT UPPER LOBE AND LEFT LOWER LOBE;  Surgeon: Grace Isaac, MD;  Location: [place];  Service: Thoracic;  Laterality: N/A;    There were no vitals filed for  this visit.       [facility] PT Assessment - 10/31/19 0001      Assessment   Medical Diagnosis R19.4 (ICD-10-CM) - Altered bowel habits; R15.1 (ICD-10-CM) - Fecal soiling; R14.0 (ICD-10-CM) - Bloating; Z86.010 (ICD-10-CM) - History of colon polyps                      Pelvic Floor Special Questions - 10/31/19 0001    Pelvic Floor Internal Exam pt informed and identity confirmed for internal assessment for tactile cues with contracting and lifting    Exam Type Rectal    Strength weak squeeze, no lift    Strength # of seconds 2    Tone low             [facility] Adult PT Treatment/Exercise - 10/31/19 0001      Neuro Re-ed    Neuro Re-ed Details  TC for contract, relax and bulge, trying variety of cues such as exhle and LE movements to find best cues to help her engage pelvic floor consistently      Lumbar Exercises: Standing   Other Standing Lumbar Exercises pallof press and rotation red band - 10x each side      Lumbar Exercises: Sidelying   Clam Right;15 reps;2 seconds    Clam Limitations tactile cues to keep pelvic floor contracted                    PT Short Term Goals - 09/11/19 1236      PT SHORT TERM GOAL #1   Title 25% reduction in leakage    Baseline 50% better    Status Achieved      PT SHORT TERM GOAL #2   Title ind with initial HEP    Status Achieved      PT SHORT TERM GOAL #3   Title able to correctly engage and activate pelvic floor without bearing down    Baseline was able to do this today    Status Achieved             PT Long Term Goals - 10/31/19 0806      PT LONG TERM GOAL #1   Title Pt will report she is having less frequent BM due to able to greater pelvic floor strength and endurance    Baseline 50% normal    Status Partially Met      PT LONG TERM GOAL #2   Title ind with advanced HEP    Status Achieved      PT LONG TERM GOAL #3   Title Pt will report 50% reduction in leakage    Baseline very tiny bit and not every  day; 50% improved    Status Achieved  PT LONG TERM GOAL #4   Title Pt will demonstrate ability to contract pelvic floor with at least 2/5 MMT and hold for 10 seconds without holding her breath in order to perform functional tasks without leakage    Baseline 2/5 for 2 sec    Status Partially Met                 Plan - 10/31/19 0803    Clinical Impression Statement Reviewed and re-assess pelvic contract, relax, and bulge.  Pt is doing much better with contract and lift.  She was  able to contract with sidelying clam and hold 2-3 seconds.  Pt has made good progress overall.  Over the last few weeks progress has remained about the same.  She is recommended to continue with her HEP and return if she is unable to continue with her slow progress at home or if she regresses.  At this time she will discharge with HEP.    PT Treatment/Interventions ADLs/Self Care Home Management;Biofeedback;Cryotherapy;Electrical Stimulation;Manual techniques;Neuromuscular re-education;Therapeutic exercise;Therapeutic activities;Patient/family education;Dry needling;Taping;Moist Heat;Passive range of motion    PT Next Visit Plan d/c    PT Home Exercise Plan Access Code: [email or web address]    Consulted and Agree with Plan of Care Patient           Patient will benefit from skilled therapeutic intervention in order to improve the following deficits and impairments:  Impaired tone, Decreased strength, Postural dysfunction, Decreased coordination, Decreased range of motion, Increased muscle spasms  Visit Diagnosis: Muscle weakness (generalized)  Unspecified lack of coordination     Problem List Patient Active Problem List   Diagnosis Date Noted  . Ankle swelling 08/08/2019  . Hyperkalemia 05/15/2019  . Osteoporosis 02/26/2018  . Colon polyp 06/28/2016  . COPD GOLD I 05/28/2016  . Upper airway cough syndrome 05/22/2016  . Acute respiratory failure with hypoxemia ([place]) 04/16/2016  . Acute respiratory  failure with hypoxia ([place]) 04/15/2016  . Acute bronchitis 04/03/2016  . Wrist pain, right 12/14/2015  . Lung mass 03/02/2015  . Pre-operative cardiovascular examination 02/21/2015  . DOE (dyspnea on exertion) 02/16/2015  . Solitary pulmonary nodule 02/15/2015  . TIA (transient ischemic attack) 12/31/2014  . Diarrhea 11/26/2014  . Well adult exam 11/26/2014  . Seasonal allergies 12/31/2013  . Splenic artery aneurysm ([place]) 04/07/2013  . Dyslipidemia, goal LDL below 70   . Pernicious anemia 09/04/2012  . 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 Long term (current) use of anticoagulants 11/08/2010  . Rheumatoid arthritis ([place]) 06/23/2010  . Pain in joint 11/26/2009  . 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 Hypothyroidism 12/08/2006  . MACULAR DEGENERATION 12/08/2006  . Essential hypertension 12/08/2006  . Diaphragmatic hernia 12/08/2006  . CRI (chronic renal insufficiency), stage 3 (moderate) 12/08/2006  . ASPLENIA 12/08/2006  . 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  . 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) 12/22/1994    Class: History of  . 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; now atretic and significantly improved LAD lesion without intervention 12/22/1994    Class: History of    Jule Ser, PT 10/31/2019, 8:12 AM  [facility] [address], [place], [place], [address] Phone: [phone]   Fax:  [phone]  Name: [name] MRN: [id number] Date of Birth: 18-Feb-1941  PHYSICAL THERAPY DISCHARGE SUMMARY  Visits from Start of Care: 11  Current functional level related to goals / functional outcomes: See above details   Remaining deficits: See  above   Education / Equipment: HEP Plan: Patient agrees to discharge.  Patient goals were partially met. Patient is being discharged due to lack of progress.  ?????    Unable to fully meet goals but did make good progress and is expected to  continue improving slowly with HEP. Recommended to return if needed  [facility], PT 10/31/19 8:14 AM

## 2019-11-04 DIAGNOSIS — R2689 Other abnormalities of gait and mobility: Secondary | ICD-10-CM | POA: Diagnosis not present

## 2019-11-04 DIAGNOSIS — M545 Low back pain: Secondary | ICD-10-CM | POA: Diagnosis not present

## 2019-11-04 DIAGNOSIS — M7062 Trochanteric bursitis, left hip: Secondary | ICD-10-CM | POA: Diagnosis not present

## 2019-11-06 ENCOUNTER — Other Ambulatory Visit: Payer: Self-pay | Admitting: Internal Medicine

## 2019-11-06 ENCOUNTER — Encounter: Payer: Medicare PPO | Admitting: Physical Therapy

## 2019-11-11 DIAGNOSIS — M545 Low back pain: Secondary | ICD-10-CM | POA: Diagnosis not present

## 2019-11-11 DIAGNOSIS — M7062 Trochanteric bursitis, left hip: Secondary | ICD-10-CM | POA: Diagnosis not present

## 2019-11-11 DIAGNOSIS — R2689 Other abnormalities of gait and mobility: Secondary | ICD-10-CM | POA: Diagnosis not present

## 2019-11-13 ENCOUNTER — Encounter: Payer: Medicare PPO | Admitting: Physical Therapy

## 2019-11-18 DIAGNOSIS — M545 Low back pain: Secondary | ICD-10-CM | POA: Diagnosis not present

## 2019-11-18 DIAGNOSIS — M7062 Trochanteric bursitis, left hip: Secondary | ICD-10-CM | POA: Diagnosis not present

## 2019-11-18 DIAGNOSIS — R2689 Other abnormalities of gait and mobility: Secondary | ICD-10-CM | POA: Diagnosis not present

## 2019-11-20 DIAGNOSIS — H02051 Trichiasis without entropian right upper eyelid: Secondary | ICD-10-CM | POA: Diagnosis not present

## 2019-11-20 DIAGNOSIS — H52203 Unspecified astigmatism, bilateral: Secondary | ICD-10-CM | POA: Diagnosis not present

## 2019-11-20 DIAGNOSIS — H35372 Puckering of macula, left eye: Secondary | ICD-10-CM | POA: Diagnosis not present

## 2019-11-20 DIAGNOSIS — H02054 Trichiasis without entropian left upper eyelid: Secondary | ICD-10-CM | POA: Diagnosis not present

## 2019-11-26 DIAGNOSIS — R2689 Other abnormalities of gait and mobility: Secondary | ICD-10-CM | POA: Diagnosis not present

## 2019-11-26 DIAGNOSIS — M7062 Trochanteric bursitis, left hip: Secondary | ICD-10-CM | POA: Diagnosis not present

## 2019-11-26 DIAGNOSIS — M545 Low back pain: Secondary | ICD-10-CM | POA: Diagnosis not present

## 2019-12-02 DIAGNOSIS — M545 Low back pain: Secondary | ICD-10-CM | POA: Diagnosis not present

## 2019-12-02 DIAGNOSIS — R2689 Other abnormalities of gait and mobility: Secondary | ICD-10-CM | POA: Diagnosis not present

## 2019-12-02 DIAGNOSIS — M7062 Trochanteric bursitis, left hip: Secondary | ICD-10-CM | POA: Diagnosis not present

## 2019-12-04 ENCOUNTER — Ambulatory Visit: Payer: Medicare PPO | Admitting: Internal Medicine

## 2019-12-04 ENCOUNTER — Encounter: Payer: Self-pay | Admitting: Internal Medicine

## 2019-12-04 ENCOUNTER — Other Ambulatory Visit: Payer: Self-pay

## 2019-12-04 DIAGNOSIS — R058 Other specified cough: Secondary | ICD-10-CM

## 2019-12-04 DIAGNOSIS — J449 Chronic obstructive pulmonary disease, unspecified: Secondary | ICD-10-CM | POA: Diagnosis not present

## 2019-12-04 DIAGNOSIS — R05 Cough: Secondary | ICD-10-CM

## 2019-12-04 MED ORDER — STIOLTO RESPIMAT 2.5-2.5 MCG/ACT IN AERS
2.0000 | INHALATION_SPRAY | Freq: Every day | RESPIRATORY_TRACT | 0 refills | Status: DC
Start: 2019-12-04 — End: 2019-12-10

## 2019-12-04 MED ORDER — GABAPENTIN 300 MG PO CAPS
300.0000 mg | ORAL_CAPSULE | Freq: Three times a day (TID) | ORAL | 2 refills | Status: DC
Start: 2019-12-04 — End: 2020-04-01

## 2019-12-04 NOTE — Progress Notes (Addendum)
Subjective:   Patient ID: [name], female    DOB: 1941/02/23     MRN: [id number]     Brief patient profile:  79  yowf  Only smoked age 79 - 38  s obvious sequelae with new cough while on a cruise in September 2016 in [place] > cxr > ct chest > PET > referred to pulmonary clinic 02/15/2015 by Dr  Philis Fendt [facility] with GOLD I copd 02/19/15     History of Present Illness  02/15/2015 1st [facility] office visit/ [name]   Chief Complaint  Patient presents with  . PULMONARY CONSULT    Pulmonary nodules. Pt reports some dyspnea with exertion, some morning dry to wet cough. Pt does not use any inhalers.   Cough better but some cc am congestion x years ? Worse in winter > total ? Usually swallows so can't say color or amt assoc with mild pnds  Doe only with exertion = MMRC1 = can walk nl pace, flat grade, can't hurry or go uphills or steps s mild sob  rec Please remember to go to the lab  department downstairs for your tests - we will call you with the results when they are available. 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 Follow-up    Coughing more and having more PND. Cough is non prod. She is not using her albuterol inhaler.   after stirs has  lots of nonproductive coughing assoc with sense of pnds  no better on anoro,  Not limited by breathing from desired activities   rec Stop anoro and montelukast to see what difference this makes For drainage / throat tickle try take CHLORPHENIRAMINE  4 mg - take one every 4 hours as needed -  available over the counter-   - late add:  pred x 6 days prn flare x one - consider adding gabapentin next ov    09/26/2016  f/u ov/Nazeer Romney re: GOLD I copd [name] / no worse breathing or coughing off anoro/montelukast but maint on nasacort  Chief Complaint  Patient presents with  . Follow-up    Breathing is doing well and she is not coughing much. No new co's.  doe = MMRC1 = can walk nl pace, flat grade, can't hurry or go uphills or steps s sob   Only a little drainage p one chlorpheniramine in am and double strength at hs and sleep fine now rec Continue protonix and pepcid for another 2 months then fine to try to taper  off by stopping the protonix first and changing the pepcid 20 mg twice daily then taper the pepcid off as well and see if cough flares or not - if does flare, restart and you may need GI evaluation  For drainage / throat tickle try take CHLORPHENIRAMINE  4 mg - take one every 4 hours as needed - available over the counter- may cause drowsiness so start with just a bedtime dose or two and see how  you tolerate it before trying in daytime   If not satisfied we start you on gabapentin 100 mg three times daily     10/08/17  Excisional bx LUL c/w nec granuloma      12/03/2017 acute extended ov/[name] re: re-establish  sob gradually downhill x one year then much worse since June 2019 Chief Complaint  Patient presents with  . Acute Visit    Increased cough since June- prod but unsure sputum color. She states her breathing has been gradually worse since her last visit May 2019.   still using chlorpheniramine  And still sleeping fine but wakes up feeling very congested at usual hour and then all during the day has same sensation but not coughing up excess/purulent mucus Doe gradually worse x one year  = MMRC2 = can't walk a nl pace on a flat grade s sob but does fine slow and flat  Arthritis good control on arava  rec For drainage / throat tickle try take CHLORPHENIRAMINE  4 mg - take  one every 4 hours as needed - available over the counter- may cause drowsiness so start with just a bedtime dose or two and see how you tolerate it before trying in daytime   Pantoprazole (protonix) 40 mg   Take  30-60 min before first meal of the day and Pepcid (famotidine)  20 mg one @  bedtime until return to office - this is the best way to tell whether stomach acid is contributing to your problem.   GERD diet  Pl   Add if cough/sob worse > Prednisone 10 mg take  4 each am x 2 days,   2 each am x 2 days,  1 each am x 2 days and stop       01/14/2018  f/u ov/[name] re:  GOLD I copd with reversible component . Did not take the prednisone  Chief Complaint  Patient presents with  . Follow-up    PFT's done today. Her cough and SOB are about the same. No new co's.   Dyspnea:  MMRC1 = can walk nl pace, flat grade, can't hurry or go uphills or steps s sob   Cough: more of throat clearing/ daytime x years / p  rx fosfamax >>  worse since then  Sleeping: flat bed / one pillow SABA use: none 02: none  rec Start BREO 151 one click each am x 2 drags each am  Try off fosfamax for now  Please schedule a follow up office visit in 6 weeks, call sooner if needed     02/25/2018  f/u ov/[name] re: COPD GOLD 1/ maint breo and no fosfamax since last ov  Chief Complaint  Patient presents with  . Follow-up    Breathing is unchanged. She does not have a rescue inhaler.   Dyspnea:   MMRC1 = can walk nl pace, flat grade, can't hurry or go uphills or steps s sob   Cough: no longer  Coughing/ not  Much throat clearing  Sleeping: bed is flat/ one pillow rec Ok to start back on fosfamax with no other changes for now to see what difference it make but if upper airway symptoms are worse you will need alternative (Recast/ prolia)  Remember to brush your teeth and tongue with [employer] based toothpaste and if not better we need to see you back for a different inhaler (you will need to bring your drug formulary  with you )     04/30/2018  f/u ov/[name] re:  Copd GOLD I/ maint breo/ back on fosfamax > more throat clearing despite rx for gerd Chief Complaint  Patient presents with  . Follow-up    Breathing is unchanged. No new co's. She does not have a rescue inhaler.   Dyspnea:  Still mmrc1  Cough: min throat clearing worse on fosfamax  Sleeping: bed is flat/ one or two pillows SABA use: none  02: none rec Try off fosfamax now  to see if throat clearing is better and if it is you Prolia or Reclast as options If not better after 3 months, bring your formulary with you and we'll try you on a different inhaler- try off the breo for a week prior to the visit you can   02/06/2019  f/u ov/[name] re:  Gradually downhill back on biphosphonates /Breo with only GOLD I criteria previously  Chief Complaint  Patient presents with  . Follow-up    Breathing is no better since last visit. She is getting SOB with exertion such as just standing up. She rarely uses her albuterol inhaler, does not feel it helps. She has non prod cough.   Dyspnea:  MMRC3 = can't walk 100 yards even at a slow pace at a flat grade s stopping due to sob   Cough: worse in fall and winter but day > noct with sensation of globus Sleeping: flat / one pillow no resp symptoms noct  SABA use: rarely, not helping  02: none  rec Pantoprazole (protonix) 40 mg   Take  30-60 min before first meal of the day and Pepcid (famotidine)  20 mg one after supper  until return to office - this is the best way to tell whether stomach acid is contributing to your problem and stop the risedronate for now  GERD diet  Prednisone 10 mg take  4 each am x 2 days,   2 each am x 2 days,  1 each am x 2 days and stop  Stop breo and start symbicort 80 Take 2 puffs first thing in am and then another 2 puffs about 12 hours later      02/21/2019  f/u ov/[name] re: GOLD I copd, worsening sob  Chief Complaint  Patient presents with  . Follow-up    Breathing is  unchanged. She rarely uses her albuterol inhaler.   Dyspnea:  Room to room but also limited by L hip pain  Cough: all day sensation of globus but no mucus x years  Sleeping: flat/ one pillow  SABA use: used once and no better  02: none rec Stop symbicort to see what difference if any it makes Gabapentin is 100 mg three times daily  Stop losartan and start metaprolol 25 mg twice daily          03/20/2019  f/u ov/[name]:[name] I copd/  cough improved p last ov  X 2 weeks then pred started and ex tol improved also Chief Complaint  Patient presents with  . Follow-up    Breathing has improved since the last visit. She is on pred taper now per Dr Lenna Gilford.   Dyspnea:  Much better walking around friends homes x 10 min s stopping slow pace Cough: globus better but not gone  Sleeping: fine resp wise SABA use: none  02: none  rec Increase Lopressor 50 mg twice daily  Gabapentin 100 mg three times a day - call if throat discomfort is not improving to adjust up to maximum of 300 mg three  times a day    09/26/2019  f/u ov/[name] re: GOLD I copd/recurrent cough [name] off gabapentin  Chief Complaint  Patient presents with  . Follow-up    pt states sob all times.pt states not using rescue inhaler  Dyspnea:  One hall at a time  Then has to stop at [facility]  Cough: worse off gabapentin  Then ran out of h1 and flared / more yellow /productive in am/ convinced it's due to "drainage"  Sleeping: denies resp symptoms   Waking her  up  SABA use: doesn't help 02: no   rec Gabapentin 100 mg three times a day and if not satisfied with the cough and sensation of too much throat mucus  We will call to schedule a sinus CT Please remember to go to the  x-ray department  for your tests - we will call you with the results when they are available   12/04/2019  f/u ov/[name] re:  Copd I / uacs  Chief Complaint  Patient presents with  . Follow-up    Breathing is about the same. Cough has been worse- prod but  unsure of sputum color. She has not been using her albuterol inhaler.   Dyspnea:  One hallway and stops  Cough: sounds  juicier in am but nothing comes up  Sleeping: typically not waking her  SABA use: none 02: none    No obvious day to day or daytime variability or assoc excess/ purulent sputum or mucus plugs or hemoptysis or cp or chest tightness, subjective wheeze or overt sinus or hb symptoms.   Sleeping  without nocturnal  or early am exacerbation  of respiratory  c/o's or need for noct saba. Also denies any obvious fluctuation of symptoms with weather or environmental changes or other aggravating or alleviating factors except as outlined above   No unusual exposure hx or h/o childhood pna/ asthma or knowledge of premature birth.  Current Allergies, Complete Past Medical History, Past Surgical History, Family History, and Social History were reviewed in [employer] record.  ROS  The following are not active complaints unless bolded Hoarseness, sore throat, dysphagia, dental problems, itching, sneezing,  nasal congestion or discharge of excess mucus or purulent secretions, ear ache,   fever, chills, sweats, unintended wt loss or wt gain, classically pleuritic or exertional cp,  orthopnea pnd or arm/hand swelling  or leg swelling, presyncope, palpitations, abdominal pain, anorexia, nausea, vomiting, diarrhea  or change in bowel habits or change in bladder habits, change in stools or change in urine, dysuria, hematuria,  rash, arthralgias, visual complaints, headache, numbness, weakness or ataxia or problems with walking or coordination,  change in mood or  memory.        Current Meds  Medication Sig  . acetaminophen (TYLENOL) 500 MG tablet Take 500 mg by mouth every 6 (six) hours as needed (pain).   [name] albuterol (VENTOLIN HFA) 108 (90 Base) MCG/ACT inhaler Inhale 2 puffs into the lungs every 6 (six) hours as needed for wheezing or shortness of breath.  [name] amLODipine  (NORVASC) 5 MG tablet Take 1 tablet (5 mg total) by mouth daily.  [name] atorvastatin (LIPITOR) 80 MG tablet TAKE 1 TABLET  DAILY  . Chlorpheniramine Maleate (CHLORPHEN PO) Take by mouth.  . Cholecalciferol (VITAMIN D3) 50 MCG (2000 UT) capsule Take 1 capsule (2,000 Units total) by mouth daily.  . clopidogrel (PLAVIX) 75 MG tablet Take 1 tablet (75 mg total) by mouth daily.  . Cyanocobalamin (B-12) 2500  MCG TABS one po qd  . famotidine (PEPCID) 20 MG tablet TAKE 1 TABLET BY MOUTH AT BEDTIME  . gabapentin (NEURONTIN) 100 MG capsule Take 1 capsule (100 mg total) by mouth 3 (three) times daily. One three times daily  . hydrochlorothiazide (HYDRODIURIL) 12.5 MG tablet TAKE 1 TABLET BY MOUTH EVERY DAY  . leflunomide (ARAVA) 20 MG tablet Take 20 mg by mouth daily.   [name] levothyroxine (SYNTHROID) 88 MCG tablet Take 1 tablet (88 mcg total) by mouth daily before breakfast.  . loperamide (IMODIUM A-D) 2 MG tablet Take 2 mg by mouth 4 (four) times daily as needed for diarrhea or loose stools.  [name] losartan (COZAAR) 100 MG tablet Take 0.5 tablets (50 mg total) by mouth daily.  . metoprolol tartrate (LOPRESSOR) 50 MG tablet Take 1.5 tablets (75 mg total) by mouth 2 (two) times daily.  . pantoprazole (PROTONIX) 40 MG tablet Take 1 tablet (40 mg total) by mouth daily. Take 30-60 min before first meal of the day  . psyllium (METAMUCIL) 58.6 % packet Take 1 packet by mouth daily.  [name] triamcinolone (NASACORT ALLERGY 24HR) 55 MCG/ACT AERO nasal inhaler Place 1 spray into the nose daily.               Objective:   Physical Exam  amb somber  wf dry sounding cough    Vital signs reviewed  12/04/2019  - Note at rest 02 sats  97% on RA    12/04/2019            148 09/26/2019          146  03/20/2019       151  02/21/2019       148 02/06/2019         148 04/30/2018      148 01/14/2018        144  12/03/2017          147  09/26/2016        141  08/07/2016          146  06/19/2016        152  05/22/2016        148    02/15/15 155 lb 6.4 oz (70.489 kg)  01/29/15 154 lb (69.854 kg)  11/26/14 153 lb 8 oz (69.627 kg)        HEENT : pt wearing mask not removed for exam due to covid - 19 concerns.   NECK :  without JVD/Nodes/TM/ nl carotid upstrokes bilaterally   LUNGS: no acc muscle use,  Min barrel  contour chest wall with bilateral  slightly decreased bs s audible wheeze and  without cough on insp or exp maneuvers and min  Hyperresonant  to  percussion bilaterally     CV:  RRR  no s3 or murmur or increase in P2, and no edema   ABD:  soft and nontender with pos end  insp Hoover's  in the supine position. No bruits or organomegaly appreciated, bowel sounds nl  MS:   Nl gait/  ext warm without deformities, calf tenderness, cyanosis or clubbing No obvious joint restrictions   SKIN: warm and dry without lesions    NEURO:  alert, approp, nl sensorium with  no motor or cerebellar deficits apparent.                     Assessment & Plan:

## 2019-12-04 NOTE — Patient Instructions (Addendum)
Stiolto  2 pffs first thing in am and you should  breathe better within a few days to a week and call for refill   Gabapentin 100 mg x 2 x three times a day for at least a week and then start gabapentin 300 three times a day    Please schedule a follow up office visit in 6 weeks, call sooner if needed

## 2019-12-05 ENCOUNTER — Encounter: Payer: Self-pay | Admitting: Internal Medicine

## 2019-12-05 NOTE — Assessment & Plan Note (Signed)
Quit smoking in her 24s - PFT's  02/19/15   FEV1 1.78 (90 % ) ratio 66  p 12 % improvement from saba p nothing  prior to study with DLCO  71 % corrects to 78 % for alv volume  And classical curvature  - Spirometry 06/19/2016  FEV1 1.65  (84%)  Ratio 69  With no prior rx  - PFT's  01/14/2018  FEV1 1.68 (90 % ) ratio 68  p 18 % improvement from saba p nothing  prior to study with DLCO  84 % corrects to 90  % for alv volume  - 01/14/2018    try BREO 100 each am > worse 02/06/2019 so d/c'd - 02/06/2019  After extensive coaching inhaler device,  effectiveness =   75% with hfa > change to symb 80 2bid and add spacer next ov if uacs component not improved  - 02/06/2019   Walked RA x one lap =  approx 250 ft - stopped due to  Sob with sats 95% avg pace  - 02/21/2019   Walked RA  2 laps @  approx 260ft each @ slow pace  stopped due to tired more than sob / sats 90%   - PFT's  09/18/19   FEV1 1.83 (101 % ) ratio 0.71  p 15 % improvement from saba p nothing prior to study with DLCO  17.91 (98%) corrects to 3.74 (91 %)  for alv volume and FV curve mild concavity on exp portion   09/26/2019   Walked RA 3 laps @ approx 274ft each @  Slow pace  stopped due sob several times  with sats 97% or higher - 12/04/2019  After extensive coaching inhaler device,  effectiveness =    75% with smi > try stiolto 2 q am    Pattern is Pt is Group B in terms of symptom/risk and laba/lama therefore appropriate rx at this point >>>  Try stiolto 2 pffs each am, fill only if improves doe

## 2019-12-05 NOTE — Assessment & Plan Note (Addendum)
Onset around 2008 - Allergy profile 05/22/2016 >  Eos 0.3 /  IgE  10 neg rast - singulair / gerd rx started 05/22/2016 >>> still with pnds 06/19/2016 > add dymista / 1st gen H1> no better but never took h1 so rec stop dymista  - no better 08/07/2016 on singulair/ anoro > rec stop both and just use prn saba/ h1 > improved 09/26/2016  - restarted gerd rx and 1st gen H1 blockers per guidelines  12/03/2017  - rec d/c fosfamax x 6 weeks 01/14/2018 > resolved 02/25/2018 so ok to rechallenge with fosfamax 02/25/2018 and flared again at ov 04/30/2018 so rec substitute prolia or Reclast  - 02/06/2019 flared again on biphosphonates > d/c permanently, consider reclast or prolia  - 02/21/2019 added gabapentin 100 tid for globus and stop losartan >>>>  50% better 03/20/2019 so rec titrate up to 300 mg tid if tolerates (did not do) - Sinus CT 10/08/2019 >>>  Nl   - 12/04/2019 titrate up to 300 mg gabapentin   Discussed in detail all the  indications, usual  risks and alternatives  relative to the benefits with patient who agrees to proceed with Rx as outlined.             Each maintenance medication was reviewed in detail including emphasizing most importantly the difference between maintenance and prns and under what circumstances the prns are to be triggered using an action plan format where appropriate.  Total time for H and P, chart review, counseling,   and generating customized AVS unique to this office visit / charting = 20 min

## 2019-12-09 ENCOUNTER — Telehealth: Payer: Self-pay | Admitting: *Deleted

## 2019-12-09 DIAGNOSIS — R2689 Other abnormalities of gait and mobility: Secondary | ICD-10-CM | POA: Diagnosis not present

## 2019-12-09 DIAGNOSIS — M545 Low back pain: Secondary | ICD-10-CM | POA: Diagnosis not present

## 2019-12-09 DIAGNOSIS — M7062 Trochanteric bursitis, left hip: Secondary | ICD-10-CM | POA: Diagnosis not present

## 2019-12-09 NOTE — Telephone Encounter (Signed)
A message was left, re: her follow up visit. 

## 2019-12-10 ENCOUNTER — Telehealth: Payer: Self-pay | Admitting: Internal Medicine

## 2019-12-10 MED ORDER — STIOLTO RESPIMAT 2.5-2.5 MCG/ACT IN AERS: 2.0000 | INHALATION_SPRAY | Freq: Every day | RESPIRATORY_TRACT | 11 refills | Status: DC

## 2019-12-10 NOTE — Telephone Encounter (Signed)
Rx for Stiolto has been sent to preferred pharmacy for pt. Attempted to call pt but unable to reach. Left her a detailed message that the Rx had been sent to pharmacy for her. Nothing further needed.

## 2019-12-17 DIAGNOSIS — M545 Low back pain: Secondary | ICD-10-CM | POA: Diagnosis not present

## 2019-12-17 DIAGNOSIS — R2689 Other abnormalities of gait and mobility: Secondary | ICD-10-CM | POA: Diagnosis not present

## 2019-12-17 DIAGNOSIS — M7062 Trochanteric bursitis, left hip: Secondary | ICD-10-CM | POA: Diagnosis not present

## 2019-12-18 ENCOUNTER — Telehealth: Payer: Self-pay | Admitting: Cardiology

## 2019-12-18 ENCOUNTER — Ambulatory Visit: Payer: Medicare PPO | Admitting: Internal Medicine

## 2019-12-18 DIAGNOSIS — H26492 Other secondary cataract, left eye: Secondary | ICD-10-CM | POA: Diagnosis not present

## 2019-12-18 NOTE — Telephone Encounter (Signed)
LVM for patient to return call to get follow up scheduled with [facility] from recall list 

## 2019-12-23 DIAGNOSIS — M545 Low back pain: Secondary | ICD-10-CM | POA: Diagnosis not present

## 2019-12-23 DIAGNOSIS — R2689 Other abnormalities of gait and mobility: Secondary | ICD-10-CM | POA: Diagnosis not present

## 2019-12-23 DIAGNOSIS — M7062 Trochanteric bursitis, left hip: Secondary | ICD-10-CM | POA: Diagnosis not present

## 2019-12-24 DIAGNOSIS — M0579 Rheumatoid arthritis with rheumatoid factor of multiple sites without organ or systems involvement: Secondary | ICD-10-CM | POA: Diagnosis not present

## 2019-12-24 DIAGNOSIS — Z79899 Other long term (current) drug therapy: Secondary | ICD-10-CM | POA: Diagnosis not present

## 2019-12-25 ENCOUNTER — Other Ambulatory Visit: Payer: Self-pay

## 2019-12-25 ENCOUNTER — Ambulatory Visit: Payer: Medicare PPO | Admitting: Internal Medicine

## 2019-12-25 ENCOUNTER — Encounter: Payer: Self-pay | Admitting: Internal Medicine

## 2019-12-25 DIAGNOSIS — M069 Rheumatoid arthritis, unspecified: Secondary | ICD-10-CM

## 2019-12-25 DIAGNOSIS — R0600 Dyspnea, unspecified: Secondary | ICD-10-CM

## 2019-12-25 DIAGNOSIS — M2550 Pain in unspecified joint: Secondary | ICD-10-CM | POA: Diagnosis not present

## 2019-12-25 DIAGNOSIS — R0609 Other forms of dyspnea: Secondary | ICD-10-CM

## 2019-12-25 MED ORDER — AMLODIPINE BESYLATE 2.5 MG PO TABS
2.5000 mg | ORAL_TABLET | Freq: Every day | ORAL | 3 refills | Status: DC
Start: 2019-12-25 — End: 2020-12-14

## 2019-12-25 NOTE — Patient Instructions (Signed)
Take Livalo 4 mg a day in place of Atorvastatin for 1 month

## 2019-12-25 NOTE — Progress Notes (Signed)
Subjective:  Patient ID: [name], female    DOB: 06/24/1940  Age: 79 y.o. MRN: [id number]  CC: No chief complaint on file.   HPI SRIYA KROEZE presents for myalgias, arthralgia - worse F/u HTN, TIA   Outpatient Medications Prior to Visit  Medication Sig Dispense Refill  . acetaminophen (TYLENOL) 500 MG tablet Take 500 mg by mouth every 6 (six) hours as needed (pain).     [name] albuterol (VENTOLIN HFA) 108 (90 Base) MCG/ACT inhaler Inhale 2 puffs into the lungs every 6 (six) hours as needed for wheezing or shortness of breath. 8 g 2  . amLODipine (NORVASC) 5 MG tablet Take 1 tablet (5 mg total) by mouth daily. 90 tablet 3  . atorvastatin (LIPITOR) 80 MG tablet TAKE 1 TABLET  DAILY 90 tablet 2  . Chlorpheniramine Maleate (CHLORPHEN PO) Take by mouth.    . Cholecalciferol (VITAMIN D3) 50 MCG (2000 UT) capsule Take 1 capsule (2,000 Units total) by mouth daily. 100 capsule 3  . clopidogrel (PLAVIX) 75 MG tablet Take 1 tablet (75 mg total) by mouth daily. 90 tablet 3  . Cyanocobalamin (B-12) 2500 MCG TABS one po qd 100 tablet 3  . famotidine (PEPCID) 20 MG tablet TAKE 1 TABLET BY MOUTH AT BEDTIME 90 tablet 2  . gabapentin (NEURONTIN) 300 MG capsule Take 1 capsule (300 mg total) by mouth 3 (three) times daily. 90 capsule 2  . hydrochlorothiazide (HYDRODIURIL) 12.5 MG tablet TAKE 1 TABLET BY MOUTH EVERY DAY 90 tablet 1  . leflunomide (ARAVA) 20 MG tablet Take 20 mg by mouth daily.     [name] levothyroxine (SYNTHROID) 88 MCG tablet Take 1 tablet (88 mcg total) by mouth daily before breakfast. 90 tablet 3  . loperamide (IMODIUM A-D) 2 MG tablet Take 2 mg by mouth 4 (four) times daily as needed for diarrhea or loose stools.    [name] losartan (COZAAR) 100 MG tablet Take 0.5 tablets (50 mg total) by mouth daily. 90 tablet 3  . metoprolol tartrate (LOPRESSOR) 50 MG tablet Take 1.5 tablets (75 mg total) by mouth 2 (two) times daily. 270 tablet 3  . pantoprazole (PROTONIX) 40 MG tablet Take 1 tablet (40 mg  total) by mouth daily. Take 30-60 min before first meal of the day 90 tablet 3  . psyllium (METAMUCIL) 58.6 % packet Take 1 packet by mouth daily.    . Tiotropium Bromide-Olodaterol (STIOLTO RESPIMAT) 2.5-2.5 MCG/ACT AERS Inhale 2 puffs into the lungs daily. 4 g 11  . triamcinolone (NASACORT ALLERGY 24HR) 55 MCG/ACT AERO nasal inhaler Place 1 spray into the nose daily.      No facility-administered medications prior to visit.    ROS: Review of Systems  Constitutional: Positive for fatigue. Negative for activity change, appetite change, chills and unexpected weight change.  HENT: Negative for congestion, mouth sores and sinus pressure.   Eyes: Negative for visual disturbance.  Respiratory: Negative for cough and chest tightness.   Gastrointestinal: Negative for abdominal pain and nausea.  Genitourinary: Negative for difficulty urinating, frequency and vaginal pain.  Musculoskeletal: Positive for arthralgias, gait problem and myalgias. Negative for back pain.  Skin: Negative for pallor and rash.  Neurological: Negative for dizziness, tremors, weakness, numbness and headaches.  Psychiatric/Behavioral: Negative for confusion and sleep disturbance.    Objective:  BP 138/80 (BP Location: Right Arm, Patient Position: Sitting, Cuff Size: Normal)   Pulse 73   Temp 98.4 F (36.9 C) (Oral)   Ht 5\' 3"  (1.6 m)  Wt 149 lb (67.6 kg)   SpO2 97%   BMI 26.39 kg/m   BP Readings from Last 3 Encounters:  12/25/19 138/80  12/04/19 110/70  09/26/19 120/80    Wt Readings from Last 3 Encounters:  12/25/19 149 lb (67.6 kg)  12/04/19 148 lb (67.1 kg)  09/26/19 146 lb 12.8 oz (66.6 kg)    Physical Exam Constitutional:      General: She is not in acute distress.    Appearance: She is well-developed. She is obese.  HENT:     Head: Normocephalic.     Right Ear: External ear normal.     Left Ear: External ear normal.     Nose: Nose normal.  Eyes:     General:        Right eye: No discharge.         Left eye: No discharge.     Conjunctiva/sclera: Conjunctivae normal.     Pupils: Pupils are equal, round, and reactive to light.  Neck:     Thyroid: No thyromegaly.     Vascular: No JVD.     Trachea: No tracheal deviation.  Cardiovascular:     Rate and Rhythm: Normal rate and regular rhythm.     Heart sounds: Normal heart sounds.  Pulmonary:     Effort: No respiratory distress.     Breath sounds: No stridor. No wheezing.  Abdominal:     General: Bowel sounds are normal. There is no distension.     Palpations: Abdomen is soft. There is no mass.     Tenderness: There is no abdominal tenderness. There is no guarding or rebound.  Musculoskeletal:        General: Tenderness present.     Cervical back: Normal range of motion and neck supple.  Lymphadenopathy:     Cervical: No cervical adenopathy.  Skin:    Findings: No erythema or rash.  Neurological:     Cranial Nerves: No cranial nerve deficit.     Motor: Weakness present. No abnormal muscle tone.     Coordination: Coordination abnormal.     Gait: Gait abnormal.     Deep Tendon Reflexes: Reflexes normal.  Psychiatric:        Behavior: Behavior normal.        Thought Content: Thought content normal.        Judgment: Judgment normal.   painful muscles and joints Walking w/a cane  Lab Results  Component Value Date   WBC 5.9 09/16/2019   HGB 12.9 09/16/2019   HCT 39.0 09/16/2019   PLT 292.0 09/16/2019   GLUCOSE 97 09/16/2019   CHOL 152 11/13/2018   TRIG 57.0 11/13/2018   HDL 72.50 11/13/2018   LDLDIRECT 162.1 10/28/2009   LDLCALC 68 11/13/2018   ALT 12 09/16/2019   AST 17 09/16/2019   NA 136 09/16/2019   K 4.6 09/16/2019   CL 101 09/16/2019   CREATININE 1.96 (H) 09/16/2019   BUN 57 (H) 09/16/2019   CO2 27 09/16/2019   TSH 2.32 09/16/2019   INR 0.89 05/20/2018    CT MAXILLOFACIAL LIMITED WO CONTRAST  Result Date: 10/08/2019 CLINICAL DATA:  Upper airway cough syndrome. EXAM: CT PARANASAL SINUS LIMITED  WITHOUT CONTRAST TECHNIQUE: Non-contiguous multidetector CT images of the paranasal sinuses were obtained in a single plane without contrast. COMPARISON:  Head CT 01/17/2007 FINDINGS: The visualized portions of the paranasal sinuses are clear without evidence of significant mucosal thickening or fluid. There is mild leftward nasal septal deviation anteriorly. The  visualized regional soft tissues are grossly unremarkable. IMPRESSION: Clear sinuses. Electronically Signed   By: Logan Bores M.D.   On: 10/08/2019 08:47    Assessment & Plan:   There are no diagnoses linked to this encounter.   No orders of the defined types were placed in this encounter.    Follow-up: No follow-ups on file.  [name], MD

## 2019-12-25 NOTE — Assessment & Plan Note (Signed)
Take Livalo 4 mg a day in place of Atorvastatin for 1 month

## 2019-12-25 NOTE — Assessment & Plan Note (Signed)
Stable no change

## 2019-12-25 NOTE — Assessment & Plan Note (Addendum)
Pains are worse -  I do not think the pains are RA related, I think pains are her statin related Labs

## 2019-12-26 ENCOUNTER — Other Ambulatory Visit: Payer: Medicare PPO

## 2019-12-26 DIAGNOSIS — M2550 Pain in unspecified joint: Secondary | ICD-10-CM

## 2019-12-27 LAB — CK: Total CK: 40 U/L (ref 29–143)

## 2019-12-29 LAB — COMPLETE METABOLIC PANEL WITH GFR
AG Ratio: 2 (calc) (ref 1.0–2.5)
ALT: 15 U/L (ref 6–29)
AST: 21 U/L (ref 10–35)
Albumin: 4.1 g/dL (ref 3.6–5.1)
Alkaline phosphatase (APISO): 93 U/L (ref 37–153)
BUN/Creatinine Ratio: 21 (calc) (ref 6–22)
BUN: 46 mg/dL — ABNORMAL HIGH (ref 7–25)
CO2: 22 mmol/L (ref 20–32)
Calcium: 10 mg/dL (ref 8.6–10.4)
Chloride: 96 mmol/L — ABNORMAL LOW (ref 98–110)
Creat: 2.19 mg/dL — ABNORMAL HIGH (ref 0.60–0.88)
GFR, Est African American: 24 mL/min/{1.73_m2} — ABNORMAL LOW (ref >=60)
GFR, Est Non African American: 20 mL/min/{1.73_m2} — ABNORMAL LOW (ref >=60)
Globulin: 2.1 g/dL (calc) (ref 1.9–3.7)
Glucose, Bld: 111 mg/dL — ABNORMAL HIGH (ref 65–99)
Potassium: 5.1 mmol/L (ref 3.5–5.3)
Sodium: 133 mmol/L — ABNORMAL LOW (ref 135–146)
Total Bilirubin: 0.5 mg/dL (ref 0.2–1.2)
Total Protein: 6.2 g/dL (ref 6.1–8.1)

## 2019-12-29 LAB — ALDOLASE: Aldolase: 4.3 U/L (ref <=8.1)

## 2019-12-30 DIAGNOSIS — M7062 Trochanteric bursitis, left hip: Secondary | ICD-10-CM | POA: Diagnosis not present

## 2019-12-30 DIAGNOSIS — M545 Low back pain: Secondary | ICD-10-CM | POA: Diagnosis not present

## 2019-12-30 DIAGNOSIS — R2689 Other abnormalities of gait and mobility: Secondary | ICD-10-CM | POA: Diagnosis not present

## 2020-01-07 DIAGNOSIS — M7062 Trochanteric bursitis, left hip: Secondary | ICD-10-CM | POA: Diagnosis not present

## 2020-01-07 DIAGNOSIS — R2689 Other abnormalities of gait and mobility: Secondary | ICD-10-CM | POA: Diagnosis not present

## 2020-01-07 DIAGNOSIS — M545 Low back pain: Secondary | ICD-10-CM | POA: Diagnosis not present

## 2020-01-13 DIAGNOSIS — R2689 Other abnormalities of gait and mobility: Secondary | ICD-10-CM | POA: Diagnosis not present

## 2020-01-13 DIAGNOSIS — M545 Low back pain: Secondary | ICD-10-CM | POA: Diagnosis not present

## 2020-01-13 DIAGNOSIS — M7062 Trochanteric bursitis, left hip: Secondary | ICD-10-CM | POA: Diagnosis not present

## 2020-01-20 DIAGNOSIS — R2689 Other abnormalities of gait and mobility: Secondary | ICD-10-CM | POA: Diagnosis not present

## 2020-01-20 DIAGNOSIS — M7062 Trochanteric bursitis, left hip: Secondary | ICD-10-CM | POA: Diagnosis not present

## 2020-01-20 DIAGNOSIS — M545 Low back pain: Secondary | ICD-10-CM | POA: Diagnosis not present

## 2020-01-21 ENCOUNTER — Other Ambulatory Visit: Payer: Self-pay | Admitting: *Deleted

## 2020-01-21 DIAGNOSIS — R911 Solitary pulmonary nodule: Secondary | ICD-10-CM

## 2020-01-22 ENCOUNTER — Ambulatory Visit (INDEPENDENT_AMBULATORY_CARE_PROVIDER_SITE_OTHER): Payer: Medicare PPO

## 2020-01-22 ENCOUNTER — Other Ambulatory Visit: Payer: Self-pay

## 2020-01-22 VITALS — BP 122/78 | HR 67 | Temp 98.0°F | Resp 16 | Ht 63.0 in | Wt 148.0 lb

## 2020-01-22 DIAGNOSIS — Z0000 Encounter for general adult medical examination without abnormal findings: Secondary | ICD-10-CM

## 2020-01-22 NOTE — Progress Notes (Addendum)
Subjective:   [name] is a 79 y.o. female who presents for Medicare Annual (Subsequent) preventive examination.  Review of Systems    No ROS. Medicare Wellness Visit Cardiac Risk Factors include: advanced age (>13men, >71 women);dyslipidemia;family history of premature cardiovascular disease;hypertension     Objective:    Today's Vitals   01/22/20 1054  BP: 122/78  Pulse: 67  Resp: 16  Temp: 98 F (36.7 C)  SpO2: 94%  Weight: 148 lb (67.1 kg)  Height: 5\' 3"  (1.6 m)  PainSc: 0-No pain   Body mass index is 26.22 kg/m.  Advanced Directives 01/22/2020 09/01/2019 08/05/2019 01/16/2019 05/20/2018 01/10/2018 10/08/2017  Does Patient Have a Medical Advance Directive? Yes Yes Yes Yes Yes Yes Yes  Type of Advance Directive Living will;Healthcare Power of [place];Living will - [place];Living will [place];Living will [place];Living will Living will;Healthcare Power of Attorney  Does patient want to make changes to medical advance directive? No - Patient declined No - Patient declined - - No - Patient declined - -  Copy of [place] in Chart? No - copy requested - - No - copy requested No - copy requested No - copy requested No - copy requested  Pre-existing out of facility DNR order (yellow form or pink MOST form) - - - - - - -    Current Medications (verified) Outpatient Encounter Medications as of 01/22/2020  Medication Sig   acetaminophen (TYLENOL) 500 MG tablet Take 500 mg by mouth every 6 (six) hours as needed (pain).    albuterol (VENTOLIN HFA) 108 (90 Base) MCG/ACT inhaler Inhale 2 puffs into the lungs every 6 (six) hours as needed for wheezing or shortness of breath.   amLODipine (NORVASC) 2.5 MG tablet Take 1 tablet (2.5 mg total) by mouth daily.   atorvastatin (LIPITOR) 80 MG tablet TAKE 1 TABLET  DAILY   Chlorpheniramine Maleate (CHLORPHEN PO) Take by mouth.    Cholecalciferol (VITAMIN D3) 50 MCG (2000 UT) capsule Take 1 capsule (2,000 Units total) by mouth daily.   clopidogrel (PLAVIX) 75 MG tablet Take 1 tablet (75 mg total) by mouth daily.   Cyanocobalamin (B-12) 2500 MCG TABS one po qd   famotidine (PEPCID) 20 MG tablet TAKE 1 TABLET BY MOUTH AT BEDTIME   gabapentin (NEURONTIN) 300 MG capsule Take 1 capsule (300 mg total) by mouth 3 (three) times daily.   hydrochlorothiazide (HYDRODIURIL) 12.5 MG tablet TAKE 1 TABLET BY MOUTH EVERY DAY   leflunomide (ARAVA) 20 MG tablet Take 20 mg by mouth daily.    levothyroxine (SYNTHROID) 88 MCG tablet Take 1 tablet (88 mcg total) by mouth daily before breakfast.   loperamide (IMODIUM A-D) 2 MG tablet Take 2 mg by mouth 4 (four) times daily as needed for diarrhea or loose stools.   losartan (COZAAR) 100 MG tablet Take 0.5 tablets (50 mg total) by mouth daily.   metoprolol tartrate (LOPRESSOR) 50 MG tablet Take 1.5 tablets (75 mg total) by mouth 2 (two) times daily.   pantoprazole (PROTONIX) 40 MG tablet Take 1 tablet (40 mg total) by mouth daily. Take 30-60 min before first meal of the day   psyllium (METAMUCIL) 58.6 % packet Take 1 packet by mouth daily.   Tiotropium Bromide-Olodaterol (STIOLTO RESPIMAT) 2.5-2.5 MCG/ACT AERS Inhale 2 puffs into the lungs daily.   triamcinolone (NASACORT ALLERGY 24HR) 55 MCG/ACT AERO nasal inhaler Place 1 spray into the nose daily.  No facility-administered encounter medications on file as of 01/22/2020.    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 Most likely scar. Mild subendocardial ischemia. EF 71% LOW RISK.    Cervical disc disease    fracture   Chronic kidney disease    COPD (chronic obstructive pulmonary disease) ([place])    Diverticulosis    Hyperlipidemia    Hypertension    Hypothyroidism (acquired)    hypo   Myocardial infarction [facility]) Aug. 2016   TIA   Polymyalgia rheumatica ([place])    2011 Dr. Charlestine Night   Rheumatoid arthritis [facility]) 2011   Dr.Truslow; fracture knees, hands and wrists -    S/P CABG x 1 1996   CABG--LIMA-LAD for ostial LAD (not felt to be PCI amenable). EF NORMAL then; LIMA now atretic   Shortness of breath dyspnea    with exertion   Stroke [facility]) 11-2014   TIA    Urinary frequency    Past Surgical History:  Procedure Laterality Date   ABDOMINAL AORTIC ANEURYSM REPAIR  0623   Complicated by mesenteric artery stenosis and splenic artery infarction with acquired Asplenia   APPENDECTOMY     BUNIONECTOMY  07/2011   right foot   CARDIAC CATHETERIZATION  2005   (Most recent CATH) - ostial LAD lesion 20-30% (down from 90% initially). Atretic LIMA. Minimal disease the RCA and Circumflex system.   CARPAL TUNNEL RELEASE Left    CATARACT EXTRACTION Bilateral    CERVICAL SPINE SURGERY     plate 2008 Dr. Saintclair Halsted   CORONARY ARTERY BYPASS GRAFT  1996   INCLUDED AN INTERNAL MAMMARY ARTERY TO THE LAD. EF WAS NORMAL   INGUINAL HERNIA REPAIR Right    LAPAROSCOPIC APPENDECTOMY N/A 06/28/2016   Procedure: APPENDECTOMY LAPAROSCOPIC;  Surgeon: Leighton Ruff, MD;  Location: [facility];  Service: General;  Laterality: N/A;   NM MYOVIEW LTD  March 2012   Fixed anteroseptal, apical and inferoapical defect with moderate size. Most likely scar. Mild subendocardial ischemia. EF 71% LOW RISK.    SPLENECTOMY     TRANSTHORACIC ECHOCARDIOGRAM  12/2014   [facility]: Normal  LV size & function. EF 55-60%,    vagina polyp     VIDEO ASSISTED THORACOSCOPY (VATS)/WEDGE RESECTION Left 03/02/2015   Procedure: VIDEO ASSISTED THORACOSCOPY (VATS), MINI THORACOTOMY, LEFT UPPER LOBE WEDGE, TAKE DOWN OF INTERNAL MAMMARY LESIONS, PLACEMENT OF ON-Q PUMP;  Surgeon: Grace Isaac, MD;  Location: [place];  Service: Thoracic;  Laterality: Left;   VIDEO BRONCHOSCOPY N/A 03/02/2015   Procedure: BRONCHOSCOPY;  Surgeon: Grace Isaac, MD;  Location: [place];  Service: Thoracic;  Laterality: N/A;   VIDEO BRONCHOSCOPY WITH ENDOBRONCHIAL NAVIGATION N/A 10/08/2017   Procedure: VIDEO BRONCHOSCOPY WITH ENDOBRONCHIAL NAVIGATION WITH BIOPSIES OF LEFT UPPER LOBE AND LEFT LOWER LOBE;  Surgeon: Grace Isaac, MD;  Location: [facility]  OR;  Service: Thoracic;  Laterality: N/A;   Family History  Problem Relation Age of Onset   Hypertension Mother    Diabetes Mother    Heart disease Mother    Hyperlipidemia Mother    Stroke Father    Hyperlipidemia Sister    Hypertension Sister    Hypertension Daughter    Cancer Paternal Uncle        Deceased from cancer not sure of site   Hypertension Son    Hyperlipidemia Son    Hyperlipidemia Son    Hypertension Son    Hyperlipidemia Son    Hypertension Son    Hypertension Other    Coronary artery disease Other    Asthma Neg Hx    Colon cancer Neg Hx    Social History   Socioeconomic History   Marital status: Married    Spouse name: Not on file   Number of children: 4   Years of education: Not on file   Highest education level: Not on file  Occupational History   Occupation: retired    Fish farm manager: RETIRED  Tobacco Use   Smoking status: Former Smoker    Quit date: 12/04/1958    Years since quitting: 61.1   Smokeless tobacco: Never Used  Scientific laboratory technician Use: Never used  Substance and Sexual Activity   Alcohol use: Yes    Comment: occassionally   Drug use: No   Sexual activity: Not Currently  Other Topics Concern   Not on file  Social  History Narrative   [facility]  [place] ; [place].    Living at [place] since dec 2015   Social Determinants of Health   Financial Resource Strain: Low Risk    Difficulty of Paying Living Expenses: Not hard at all  Food Insecurity: No Food Insecurity   Worried About Charity fundraiser in the Last Year: Never true   Arboriculturist in the Last Year: Never true  Transportation Needs: No Transportation Needs   Lack of Transportation (Medical): No   Lack of Transportation (Non-Medical): No  Physical Activity: Sufficiently Active   Days of Exercise per Week: 5 days   Minutes of Exercise per Session: 30 min  Stress: No Stress Concern Present   Feeling of Stress : Not at all  Social Connections: Moderately Isolated   Frequency of Communication with Friends and Family: More than three times a week   Frequency of Social Gatherings with Friends and Family: Never   Attends Religious Services: Never   Marine scientist or Organizations: No   Attends Music therapist: Never   Marital Status: Married    Tobacco Counseling Counseling given: Not Answered   Clinical Intake:  Pre-visit preparation completed: Yes  Pain : No/denies pain Pain Score: 0-No pain     BMI - recorded: 26.22 Nutritional Status: BMI 25 -29 Overweight Nutritional Risks: Other (Comment) Diabetes: No  How often do you need to have someone help you when you read instructions, pamphlets, or other written materials from your doctor or pharmacy?: 1 - Never What is the last grade level you completed in school?: Bachelor's Degree  Diabetic? no  Interpreter Needed?: No  Information entered by :: Lisette Abu, LPN   Activities of Daily Living In your present state of health, do you have any difficulty performing the following activities: 01/22/2020  Hearing? N  Vision? N  Difficulty concentrating or making decisions?  N  Walking or  climbing stairs? N  Dressing or bathing? N  Doing errands, shopping? N  Preparing Food and eating ? N  Using the Toilet? N  In the past six months, have you accidently leaked urine? Y  Comment wears a pad for protection  Do you have problems with loss of bowel control? Y  Comment wears a pad for protection  Managing your Medications? N  Managing your Finances? N  Housekeeping or managing your Housekeeping? N  Some recent data might be hidden    Patient Care Team: Plotnikov, [name], MD as PCP - General Leonie Man, MD as PCP - Cardiology (Cardiology) Leonie Man, MD as Consulting Physician (Cardiology) Gavin Pound, MD as Consulting Physician (Rheumatology) Armbruster, Carlota Raspberry, MD as Consulting Physician (Gastroenterology) Tanda Rockers, MD as Consulting Physician (Pulmonary Disease) Grace Isaac, MD as Consulting Physician (Cardiothoracic Surgery) Aloha Gell, MD as Consulting Physician (Obstetrics and Gynecology)  Indicate any recent Medical Services you may have received from other than [facility] providers in the past year (date may be approximate).     Assessment:   This is a routine wellness examination for J[place]ara.  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 0 0 No No No  Number falls in past yr: 0 0 - - -  Injury with Fall? 0 0 - - -  Risk for fall due to : No Fall Risks Impaired balance/gait - - -  Risk for fall due to: Comment - - - - -  Follow up Falls evaluation completed;Education provided - - - -    Any stairs in or around the home? No  If so, are there any without handrails? No  Home free of loose throw rugs in walkways, pet beds, electrical cords, etc? Yes  Adequate lighting in your home to reduce risk of falls? Yes   ASSISTIVE DEVICES UTILIZED TO PREVENT FALLS:  Life alert? Yes  Use of a cane, walker or w/c? Yes  Grab bars in the bathroom? Yes  Shower chair or bench in shower? Yes  Elevated toilet seat or a handicapped toilet? Yes   TIMED UP AND GO:  Was the test performed? No .  Length of time to ambulate 10 feet: 0 sec.   Gait steady and fast with assistive device  Cognitive Function: MMSE - Mini Mental State Exam 12/21/2016 11/26/2014  Not completed: - Unable  to complete  Orientation to time 5 -  Orientation to Place 5 -  Registration 3 -  Attention/ Calculation 5 -  Recall 2 -  Language- name 2 objects 2 -  Language- repeat 1 -  Language- follow 3 step command 3 -  Language- read & follow direction 1 -  Write a sentence 1 -  Copy design 1 -  Total score 29 -     6CIT Screen 01/22/2020  What Year? 0 points  What month? 0 points  What time? 3 points  Count back from 20 0 points  Months in reverse 0 points  Repeat phrase 0 points  Total Score 3    Immunizations Immunization History  Administered Date(s) Administered   Influenza Whole 05/02/2001, 01/27/2008, 01/12/2010, 12/31/2010, 01/25/2012   Influenza, High  Dose Seasonal PF 01/14/2014, 02/13/2015, 12/21/2016, 01/09/2018, 12/29/2018   Influenza,inj,Quad PF,6+ Mos 12/23/2015   Influenza-Unspecified 02/03/2013, 01/13/2014, 02/13/2015   Meningococcal Polysaccharide 03/21/2012   Moderna SARS-COVID-2 Vaccination 06/02/2019, 06/30/2019   PPD Test 11/18/2013   Pneumococcal Conjugate-13 03/20/2013   Pneumococcal Polysaccharide-23 05/02/2000, 06/23/2010, 02/26/2018   Td 05/01/2006   Tdap 06/01/2016   Zoster 01/17/2006   Zoster Recombinat (Shingrix) 05/16/2017, 07/16/2017    TDAP status: Up to date Flu Vaccine status: Up to date Pneumococcal vaccine status: Up to date Covid-19 vaccine status: Completed vaccines  Qualifies for Shingles Vaccine? Yes   Zostavax completed Yes   Shingrix Completed?: Yes  Screening Tests Health Maintenance  Topic Date Due   COLONOSCOPY  03/30/2019   INFLUENZA VACCINE  11/30/2019   TETANUS/TDAP  06/01/2026   DEXA SCAN  Completed   COVID-19 Vaccine  Completed   PNA vac Low Risk Adult  Completed    Health Maintenance  Health Maintenance Due  Topic Date Due   COLONOSCOPY  03/30/2019   INFLUENZA VACCINE  11/30/2019    Colorectal cancer screening: No longer required.  Mammogram status: Completed 01/15/2019. Repeat every year Bone Density status: Completed 10/23/2017. Results reflect: Bone density results: OSTEOPOROSIS. Repeat every 3 years.  Lung Cancer Screening: (Low Dose CT Chest recommended if Age 23-80 years, 30 pack-year currently smoking OR have quit w/in 15years.) does not qualify.   Lung Cancer Screening Referral: no  Additional Screening:  Hepatitis C Screening: does not qualify; Completed no  Vision Screening: Recommended annual ophthalmology exams for early detection of glaucoma and other disorders of the eye. Is the patient up to date with their annual eye exam?  Yes  Who is the provider or what is the name of the office in which the patient attends annual eye exams? Shon Hough, MD If  pt is not established with a provider, would they like to be referred to a provider to establish care? No .   Dental Screening: Recommended annual dental exams for proper oral hygiene  Community Resource Referral / Chronic Care Management: CRR required this visit?  No   CCM required this visit?  No      Plan:     I have personally reviewed and noted the following in the patient's chart:   Medical and social history Use of alcohol, tobacco or illicit drugs  Current medications and supplements Functional ability and status Nutritional status Physical activity Advanced directives List of other physicians Hospitalizations, surgeries, and ER visits in previous 12 months Vitals Screenings to include cognitive, depression, and falls Referrals and appointments  In addition, I have reviewed and discussed with patient certain preventive protocols, quality metrics, and best practice recommendations. A  written personalized care plan for preventive services as well as general preventive health recommendations were provided to patient.     Sheral Flow, LPN   [id number]   Nurse Notes: n/a  Medical screening examination/treatment/procedure(s) were performed by non-physician practitioner and as supervising physician I was immediately available for consultation/collaboration.  I agree with above. Lew Dawes, MD

## 2020-01-22 NOTE — Patient Instructions (Addendum)
[name] , Thank you for taking time to come for your Medicare Wellness Visit. I appreciate your ongoing commitment to your health goals. Please review the following plan we discussed and let me know if I can assist you in the future.   Screening recommendations/referrals: Colonoscopy: no repeat due to age 79: 01/15/2019 Bone Density: 10/23/2017; every 3 years Recommended yearly ophthalmology/optometry visit for glaucoma screening and checkup Recommended yearly dental visit for hygiene and checkup  Vaccinations: Influenza vaccine: 01/20/2020 Pneumococcal vaccine: completed Tdap vaccine: 06/01/2016 Shingles vaccine: completed   Covid-19: completed  Advanced directives: Please bring a copy of your health care power of attorney and living will to the office at your convenience.   Conditions/risks identified: Yes; Reviewed health maintenance screenings with patient today and relevant education, vaccines, and/or referrals were provided. Please continue to do your personal lifestyle choices by: daily care of teeth and gums, regular physical activity (goal should be 5 days a week for 30 minutes), eat a healthy diet, avoid tobacco and drug use, limiting any alcohol intake, taking a low-dose aspirin (if not allergic or have been advised by your provider otherwise) and taking vitamins and minerals as recommended by your provider. Continue doing brain stimulating activities (puzzles, reading, adult coloring books, staying active) to keep memory sharp. Continue to eat heart healthy diet (full of fruits, vegetables, whole grains, lean protein, water--limit salt, fat, and sugar intake) and increase physical activity as tolerated.  Next appointment: Please schedule your next Medicare Wellness Visit with your Nurse Health Advisor in 1 year.  Preventive Care 42 Years and Older, Female Preventive care refers to lifestyle choices and visits with your health care provider that can promote health and  wellness. What does preventive care include?  A yearly physical exam. This is also called an annual well check.  Dental exams once or twice a year.  Routine eye exams. Ask your health care provider how often you should have your eyes checked.  Personal lifestyle choices, including:  Daily care of your teeth and gums.  Regular physical activity.  Eating a healthy diet.  Avoiding tobacco and drug use.  Limiting alcohol use.  Practicing safe sex.  Taking low-dose aspirin every day.  Taking vitamin and mineral supplements as recommended by your health care provider. What happens during an annual well check? The services and screenings done by your health care provider during your annual well check will depend on your age, overall health, lifestyle risk factors, and family history of disease. Counseling  Your health care provider may ask you questions about your:  Alcohol use.  Tobacco use.  Drug use.  Emotional well-being.  Home and relationship well-being.  Sexual activity.  Eating habits.  History of falls.  Memory and ability to understand (cognition).  Work and work Statistician.  Reproductive health. Screening  You may have the following tests or measurements:  Height, weight, and BMI.  Blood pressure.  Lipid and cholesterol levels. These may be checked every 5 years, or more frequently if you are over 61 years old.  Skin check.  Lung cancer screening. You may have this screening every year starting at age 74 if you have a 30-pack-year history of smoking and currently smoke or have quit within the past 15 years.  Fecal occult blood test (FOBT) of the stool. You may have this test every year starting at age 65.  Flexible sigmoidoscopy or colonoscopy. You may have a sigmoidoscopy every 5 years or a colonoscopy every 10 years starting at  age 33.  Hepatitis C blood test.  Hepatitis B blood test.  Sexually transmitted disease (STD)  testing.  Diabetes screening. This is done by checking your blood sugar (glucose) after you have not eaten for a while (fasting). You may have this done every 1-3 years.  Bone density scan. This is done to screen for osteoporosis. You may have this done starting at age 41.  Mammogram. This may be done every 1-2 years. Talk to your health care provider about how often you should have regular mammograms. Talk with your health care provider about your test results, treatment options, and if necessary, the need for more tests. Vaccines  Your health care provider may recommend certain vaccines, such as:  Influenza vaccine. This is recommended every year.  Tetanus, diphtheria, and acellular pertussis (Tdap, Td) vaccine. You may need a Td booster every 10 years.  Zoster vaccine. You may need this after age 68.  Pneumococcal 13-valent conjugate (PCV13) vaccine. One dose is recommended after age 10.  Pneumococcal polysaccharide (PPSV23) vaccine. One dose is recommended after age 17. Talk to your health care provider about which screenings and vaccines you need and how often you need them. This information is not intended to replace advice given to you by your health care provider. Make sure you discuss any questions you have with your health care provider. Document Released: 05/14/2015 Document Revised: 01/05/2016 Document Reviewed: 02/16/2015 Elsevier Interactive Patient Education  2017 [place] Prevention in the Home Falls can cause injuries. They can happen to people of all ages. There are many things you can do to make your home safe and to help prevent falls. What can I do on the outside of my home?  Regularly fix the edges of walkways and driveways and fix any cracks.  Remove anything that might make you trip as you walk through a door, such as a raised step or threshold.  Trim any bushes or trees on the path to your home.  Use bright outdoor lighting.  Clear any walking  paths of anything that might make someone trip, such as rocks or tools.  Regularly check to see if handrails are loose or broken. Make sure that both sides of any steps have handrails.  Any raised decks and porches should have guardrails on the edges.  Have any leaves, snow, or ice cleared regularly.  Use sand or salt on walking paths during winter.  Clean up any spills in your garage right away. This includes oil or grease spills. What can I do in the bathroom?  Use night lights.  Install grab bars by the toilet and in the tub and shower. Do not use towel bars as grab bars.  Use non-skid mats or decals in the tub or shower.  If you need to sit down in the shower, use a plastic, non-slip stool.  Keep the floor dry. Clean up any water that spills on the floor as soon as it happens.  Remove soap buildup in the tub or shower regularly.  Attach bath mats securely with double-sided non-slip rug tape.  Do not have throw rugs and other things on the floor that can make you trip. What can I do in the bedroom?  Use night lights.  Make sure that you have a light by your bed that is easy to reach.  Do not use any sheets or blankets that are too big for your bed. They should not hang down onto the floor.  Have a firm chair  that has side arms. You can use this for support while you get dressed.  Do not have throw rugs and other things on the floor that can make you trip. What can I do in the kitchen?  Clean up any spills right away.  Avoid walking on wet floors.  Keep items that you use a lot in easy-to-reach places.  If you need to reach something above you, use a strong step stool that has a grab bar.  Keep electrical cords out of the way.  Do not use floor polish or wax that makes floors slippery. If you must use wax, use non-skid floor wax.  Do not have throw rugs and other things on the floor that can make you trip. What can I do with my stairs?  Do not leave any items  on the stairs.  Make sure that there are handrails on both sides of the stairs and use them. Fix handrails that are broken or loose. Make sure that handrails are as long as the stairways.  Check any carpeting to make sure that it is firmly attached to the stairs. Fix any carpet that is loose or worn.  Avoid having throw rugs at the top or bottom of the stairs. If you do have throw rugs, attach them to the floor with carpet tape.  Make sure that you have a light switch at the top of the stairs and the bottom of the stairs. If you do not have them, ask someone to add them for you. What else can I do to help prevent falls?  Wear shoes that:  Do not have high heels.  Have rubber bottoms.  Are comfortable and fit you well.  Are closed at the toe. Do not wear sandals.  If you use a stepladder:  Make sure that it is fully opened. Do not climb a closed stepladder.  Make sure that both sides of the stepladder are locked into place.  Ask someone to hold it for you, if possible.  Clearly mark and make sure that you can see:  Any grab bars or handrails.  First and last steps.  Where the edge of each step is.  Use tools that help you move around (mobility aids) if they are needed. These include:  Canes.  Walkers.  Scooters.  Crutches.  Turn on the lights when you go into a dark area. Replace any light bulbs as soon as they burn out.  Set up your furniture so you have a clear path. Avoid moving your furniture around.  If any of your floors are uneven, fix them.  If there are any pets around you, be aware of where they are.  Review your medicines with your doctor. Some medicines can make you feel dizzy. This can increase your chance of falling. Ask your doctor what other things that you can do to help prevent falls. This information is not intended to replace advice given to you by your health care provider. Make sure you discuss any questions you have with your health care  provider. Document Released: 02/11/2009 Document Revised: 09/23/2015 Document Reviewed: 05/22/2014 Elsevier Interactive Patient Education  2017 [employer].

## 2020-01-27 DIAGNOSIS — M545 Low back pain: Secondary | ICD-10-CM | POA: Diagnosis not present

## 2020-01-27 DIAGNOSIS — R2689 Other abnormalities of gait and mobility: Secondary | ICD-10-CM | POA: Diagnosis not present

## 2020-01-27 DIAGNOSIS — M7062 Trochanteric bursitis, left hip: Secondary | ICD-10-CM | POA: Diagnosis not present

## 2020-01-29 ENCOUNTER — Ambulatory Visit (INDEPENDENT_AMBULATORY_CARE_PROVIDER_SITE_OTHER): Payer: Medicare PPO | Admitting: Internal Medicine

## 2020-01-29 ENCOUNTER — Other Ambulatory Visit: Payer: Self-pay

## 2020-01-29 ENCOUNTER — Encounter: Payer: Self-pay | Admitting: Internal Medicine

## 2020-01-29 DIAGNOSIS — J449 Chronic obstructive pulmonary disease, unspecified: Secondary | ICD-10-CM

## 2020-01-29 DIAGNOSIS — R05 Cough: Secondary | ICD-10-CM | POA: Diagnosis not present

## 2020-01-29 DIAGNOSIS — R058 Other specified cough: Secondary | ICD-10-CM

## 2020-01-29 NOTE — Assessment & Plan Note (Signed)
Onset around 2008 - Allergy profile 05/22/2016 >  Eos 0.3 /  IgE  10 neg rast - singulair / gerd rx started 05/22/2016 >>> still with pnds 06/19/2016 > add dymista / 1st gen H1> no better but never took h1 so rec stop dymista  - no better 08/07/2016 on singulair/ anoro > rec stop both and just use prn saba/ h1 > improved 09/26/2016  - restarted gerd rx and 1st gen H1 blockers per guidelines  12/03/2017  - rec d/c fosfamax x 6 weeks 01/14/2018 > resolved 02/25/2018 so ok to rechallenge with fosfamax 02/25/2018 and flared again at ov 04/30/2018 so rec substitute prolia or Reclast  - 02/06/2019 flared again on biphosphonates > d/c permanently, consider reclast or prolia  - 02/21/2019 added gabapentin 100 tid for globus and stop losartan >>>>  50% better 03/20/2019 so rec titrate up to 300 mg tid if tolerates (did not do) - Sinus CT 10/08/2019 >>>  Nl  - 12/04/2019 titrate up to 300 mg gabapentin to tid > only taking bid as of 01/29/2020 so rec bfast, supper, bedtime dosing and if not better > [place] ent wfu next step

## 2020-01-29 NOTE — Assessment & Plan Note (Signed)
Quit smoking in her 66s - PFT's  02/19/15   FEV1 1.78 (90 % ) ratio 66  p 12 % improvement from saba p nothing  prior to study with DLCO  71 % corrects to 78 % for alv volume  And classical curvature  - Spirometry 06/19/2016  FEV1 1.65  (84%)  Ratio 69  With no prior rx  - PFT's  01/14/2018  FEV1 1.68 (90 % ) ratio 68  p 18 % improvement from saba p nothing  prior to study with DLCO  84 % corrects to 90  % for alv volume  - 01/14/2018    try BREO 100 each am > worse 02/06/2019 so d/c'd - 02/06/2019  After extensive coaching inhaler device,  effectiveness =   75% with hfa > change to symb 80 2bid and add spacer next ov if uacs component not improved  - 02/06/2019   Walked RA x one lap =  approx 250 ft - stopped due to  Sob with sats 95% avg pace  - 02/21/2019   Walked RA  2 laps @  approx 226ft each @ slow pace  stopped due to tired more than sob / sats 90%   - PFT's  09/18/19   FEV1 1.83 (101 % ) ratio 0.71  p 15 % improvement from saba p nothing prior to study with DLCO  17.91 (98%) corrects to 3.74 (91 %)  for alv volume and FV curve mild concavity on exp portion   09/26/2019   Walked RA 3 laps @ approx 258ft each @  Slow pace  stopped due sob several times  with sats 97% or higher - 12/04/2019  After extensive coaching inhaler device,  effectiveness =    75% with smi > try stiolto 2 q am > dry mouth, very sedentary so rec try 1 puff then 0 to determine side effects (upper airway) vs benefits  (doe which is more limited by knees now so may not be much benefit on any doses of stiolto)  Pt is Group B in terms of symptom/risk and laba/lama therefore appropriate rx at this point >>>  stiolto appropl  Comment :   although there is copd present, it may not be clinically relevant:   It may not be limiting activity tolerance any more than a set of worn tires limits someone from driving a car  around a parking lot.  A new set of Michelins might look good but would have no perceived impact on the performance of the car  and would not be worth the cost. Above trial should determine whether she even needs to be on any maint rx at this point.         Each maintenance medication was reviewed in detail including emphasizing most importantly the difference between maintenance and prns and under what circumstances the prns are to be triggered using an action plan format where appropriate.  Total time for H and P, chart review, counseling, teaching device and generating customized AVS unique to this office visit / charting = 24 min

## 2020-01-29 NOTE — Progress Notes (Signed)
Subjective:   Patient ID: [name], female    DOB: 1940/10/21     MRN: [id number]     Brief patient profile:  79  yowf  Only smoked age 79 - 79  s obvious sequelae with new cough while on a cruise in September 2016 in [place] > cxr > ct chest > PET > referred to pulmonary clinic 02/15/2015 by Dr  Philis Fendt [facility] with GOLD I copd 02/19/15     History of Present Illness  02/15/2015 1st [facility] office visit/ [name]   Chief Complaint  Patient presents with  . PULMONARY CONSULT    Pulmonary nodules. Pt reports some dyspnea with exertion, some morning dry to wet cough. Pt does not use any inhalers.   Cough better but some cc am congestion x years ? Worse in winter > total ? Usually swallows so can't say color or amt assoc with mild pnds  Doe only with exertion = MMRC1 = can walk nl pace, flat grade, can't hurry or go uphills or steps s mild sob  rec Please remember to go to the lab  department downstairs for your tests - we will call you with the results when they are available. 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 Follow-up    Coughing more and having more PND. Cough is non prod. She is not using her albuterol inhaler.   after stirs has  lots of nonproductive coughing assoc with sense of pnds  no better on anoro,  Not limited by breathing from desired activities   rec Stop anoro and montelukast to see what difference this makes For drainage / throat tickle try take CHLORPHENIRAMINE  4 mg - take one every 4 hours as needed -  available over the counter-   - late add:  pred x 6 days prn flare x one - consider adding gabapentin next ov    09/26/2016  f/u ov/[name] re: GOLD I copd [name] / no worse breathing or coughing off anoro/montelukast but maint on nasacort  Chief Complaint  Patient presents with  . Follow-up    Breathing is doing well and she is not coughing much. No new co's.  doe = MMRC1 = can walk nl pace, flat grade, can't hurry or go uphills or steps s sob   Only a little drainage p one chlorpheniramine in am and double strength at hs and sleep fine now rec Continue protonix and pepcid for another 2 months then fine to try to taper  off by stopping the protonix first and changing the pepcid 20 mg twice daily then taper the pepcid off as well and see if cough flares or not - if does flare, restart and you may need GI evaluation  For drainage / throat tickle try take CHLORPHENIRAMINE  4 mg - take one every 4 hours as needed - available over the counter- may cause drowsiness so start with just a bedtime dose or two and see how  you tolerate it before trying in daytime   If not satisfied we start you on gabapentin 100 mg three times daily     10/08/17  Excisional bx LUL c/w nec granuloma      12/03/2017 acute extended ov/[name] re: re-establish  sob gradually downhill x one year then much worse since June 2019 Chief Complaint  Patient presents with  . Acute Visit    Increased cough since June- prod but unsure sputum color. She states her breathing has been gradually worse since her last visit May 2019.   still using chlorpheniramine  And still sleeping fine but wakes up feeling very congested at usual hour and then all during the day has same sensation but not coughing up excess/purulent mucus Doe gradually worse x one year  = MMRC2 = can't walk a nl pace on a flat grade s sob but does fine slow and flat  Arthritis good control on arava  rec For drainage / throat tickle try take CHLORPHENIRAMINE  4 mg - take  one every 4 hours as needed - available over the counter- may cause drowsiness so start with just a bedtime dose or two and see how you tolerate it before trying in daytime   Pantoprazole (protonix) 40 mg   Take  30-60 min before first meal of the day and Pepcid (famotidine)  20 mg one @  bedtime until return to office - this is the best way to tell whether stomach acid is contributing to your problem.   GERD diet  Pl   Add if cough/sob worse > Prednisone 10 mg take  4 each am x 2 days,   2 each am x 2 days,  1 each am x 2 days and stop       01/14/2018  f/u ov/[name] re:  GOLD I copd with reversible component . Did not take the prednisone  Chief Complaint  Patient presents with  . Follow-up    PFT's done today. Her cough and SOB are about the same. No new co's.   Dyspnea:  MMRC1 = can walk nl pace, flat grade, can't hurry or go uphills or steps s sob   Cough: more of throat clearing/ daytime x years / p  rx fosfamax >>  worse since then  Sleeping: flat bed / one pillow SABA use: none 02: none  rec Start BREO 338 one click each am x 2 drags each am  Try off fosfamax for now  Please schedule a follow up office visit in 6 weeks, call sooner if needed     02/25/2018  f/u ov/[name] re: COPD GOLD 1/ maint breo and no fosfamax since last ov  Chief Complaint  Patient presents with  . Follow-up    Breathing is unchanged. She does not have a rescue inhaler.   Dyspnea:   MMRC1 = can walk nl pace, flat grade, can't hurry or go uphills or steps s sob   Cough: no longer  Coughing/ not  Much throat clearing  Sleeping: bed is flat/ one pillow rec Ok to start back on fosfamax with no other changes for now to see what difference it make but if upper airway symptoms are worse you will need alternative (Recast/ prolia)  Remember to brush your teeth and tongue with [employer] based toothpaste and if not better we need to see you back for a different inhaler (you will need to bring your drug formulary  with you )     04/30/2018  f/u ov/[name] re:  Copd GOLD I/ maint breo/ back on fosfamax > more throat clearing despite rx for gerd Chief Complaint  Patient presents with  . Follow-up    Breathing is unchanged. No new co's. She does not have a rescue inhaler.   Dyspnea:  Still mmrc1  Cough: min throat clearing worse on fosfamax  Sleeping: bed is flat/ one or two pillows SABA use: none  02: none rec Try off fosfamax now  to see if throat clearing is better and if it is you Prolia or Reclast as options If not better after 3 months, bring your formulary with you and we'll try you on a different inhaler- try off the breo for a week prior to the visit you can   02/06/2019  f/u ov/[name] re:  Gradually downhill back on biphosphonates /Breo with only GOLD I criteria previously  Chief Complaint  Patient presents with  . Follow-up    Breathing is no better since last visit. She is getting SOB with exertion such as just standing up. She rarely uses her albuterol inhaler, does not feel it helps. She has non prod cough.   Dyspnea:  MMRC3 = can't walk 100 yards even at a slow pace at a flat grade s stopping due to sob   Cough: worse in fall and winter but day > noct with sensation of globus Sleeping: flat / one pillow no resp symptoms noct  SABA use: rarely, not helping  02: none  rec Pantoprazole (protonix) 40 mg   Take  30-60 min before first meal of the day and Pepcid (famotidine)  20 mg one after supper  until return to office - this is the best way to tell whether stomach acid is contributing to your problem and stop the risedronate for now  GERD diet  Prednisone 10 mg take  4 each am x 2 days,   2 each am x 2 days,  1 each am x 2 days and stop  Stop breo and start symbicort 80 Take 2 puffs first thing in am and then another 2 puffs about 12 hours later      02/21/2019  f/u ov/[name] re: GOLD I copd, worsening sob  Chief Complaint  Patient presents with  . Follow-up    Breathing is  unchanged. She rarely uses her albuterol inhaler.   Dyspnea:  Room to room but also limited by L hip pain  Cough: all day sensation of globus but no mucus x years  Sleeping: flat/ one pillow  SABA use: used once and no better  02: none rec Stop symbicort to see what difference if any it makes Gabapentin is 100 mg three times daily  Stop losartan and start metaprolol 25 mg twice daily          03/20/2019  f/u ov/[name] ZO:XWRU I copd/  cough improved p last ov  X 2 weeks then pred started and ex tol improved also Chief Complaint  Patient presents with  . Follow-up    Breathing has improved since the last visit. She is on pred taper now per Dr Lenna Gilford.   Dyspnea:  Much better walking around friends homes x 10 min s stopping slow pace Cough: globus better but not gone  Sleeping: fine resp wise SABA use: none  02: none  rec Increase Lopressor 50 mg twice daily  Gabapentin 100 mg three times a day - call if throat discomfort is not improving to adjust up to maximum of 300 mg three  times a day    09/26/2019  f/u ov/[name] re: GOLD I copd/recurrent cough [name] off gabapentin  Chief Complaint  Patient presents with  . Follow-up    pt states sob all times.pt states not using rescue inhaler  Dyspnea:  One hall at a time  Then has to stop at [facility]  Cough: worse off gabapentin  Then ran out of h1 and flared / more yellow /productive in am/ convinced it's due to "drainage"  Sleeping: denies resp symptoms   Waking her  up  SABA use: doesn't help 02: no   rec Gabapentin 100 mg three times a day and if not satisfied with the cough and sensation of too much throat mucus  We will call to schedule a sinus CT Please remember to go to the  x-ray department  for your tests - we will call you with the results when they are available   12/04/2019  f/u ov/[name] re:  Copd I / uacs  Chief Complaint  Patient presents with  . Follow-up    Breathing is about the same. Cough has been worse- prod but  unsure of sputum color. She has not been using her albuterol inhaler.   Dyspnea:  One hallway and stops  Cough: sounds  juicier in am but nothing comes up  Sleeping: typically not waking her  SABA use: none 02: none  rec Stiolto  2 pffs first thing in am and you should  breathe better within a few days to a week and call for refill  Gabapentin 100 mg x 2 x three times a day for at least a week and then start gabapentin 300 three times a day    01/29/2020  f/u ov/[name] re: COPD I / uacs / not able to remember lunch time gabapentin Chief Complaint  Patient presents with  . Follow-up    Breathing is overall doing well. She states does have some "congestion and a frog in my throat". She has not had to use her albuterol inhaler since her last visit.   Dyspnea: more limited by legs than sob  Cough: frog in throat and dry mouth since stiolto 2 each am but thinks breathing better  Sleeping: bed is flat SABA use: none 02: none     No obvious day to day or daytime variability or assoc excess/ purulent sputum or mucus plugs or hemoptysis or cp or chest tightness, subjective wheeze or overt sinus or hb symptoms.   Sleeping  without nocturnal  or early am exacerbation  of respiratory  c/o's or need for noct saba. Also denies any obvious fluctuation of symptoms with weather or environmental changes or other aggravating or alleviating factors except as outlined above   No unusual exposure hx or h/o childhood pna/ asthma or knowledge of premature birth.  Current Allergies, Complete Past Medical History, Past Surgical History, Family History, and Social History were reviewed in [employer] record.  ROS  The following are not active complaints unless bolded Hoarseness, sore throat, dysphagia= globus daytime, dental problems, itching, sneezing,  nasal congestion or discharge of excess mucus or purulent secretions, ear ache,   fever, chills, sweats, unintended wt loss or wt gain,  classically pleuritic or exertional cp,  orthopnea pnd or arm/hand swelling  or leg swelling, presyncope, palpitations, abdominal pain, anorexia, nausea, vomiting, diarrhea  or change in bowel habits or change in bladder habits, change in stools or change in urine, dysuria, hematuria,  rash, arthralgias, visual complaints, headache, numbness,  weakness or ataxia or problems with walking or coordination,  change in mood or  memory.        Current Meds  Medication Sig  . acetaminophen (TYLENOL) 500 MG tablet Take 500 mg by mouth every 6 (six) hours as needed (pain).   [name] albuterol (VENTOLIN HFA) 108 (90 Base) MCG/ACT inhaler Inhale 2 puffs into the lungs every 6 (six) hours as needed for wheezing or shortness of breath.  [name] amLODipine (NORVASC) 2.5 MG tablet Take 1 tablet (2.5 mg total) by mouth daily.  [name] atorvastatin (LIPITOR) 80 MG tablet TAKE 1 TABLET  DAILY  . Chlorpheniramine Maleate (CHLORPHEN PO) Take by mouth.  . Cholecalciferol (VITAMIN D3) 50 MCG (2000 UT) capsule Take 1 capsule (2,000 Units total) by mouth daily.  . clopidogrel (PLAVIX) 75 MG tablet Take 1 tablet (75 mg total) by mouth daily.  . Cyanocobalamin (B-12) 2500 MCG TABS one po qd  . famotidine (PEPCID) 20 MG tablet TAKE 1 TABLET BY MOUTH AT BEDTIME  . gabapentin (NEURONTIN) 300 MG capsule Take 1 capsule (300 mg total) by mouth 3 (three) times daily.  . hydrochlorothiazide (HYDRODIURIL) 12.5 MG tablet TAKE 1 TABLET BY MOUTH EVERY DAY  . leflunomide (ARAVA) 20 MG tablet Take 20 mg by mouth daily.   [name] levothyroxine (SYNTHROID) 88 MCG tablet Take 1 tablet (88 mcg total) by mouth daily before breakfast.  . loperamide (IMODIUM A-D) 2 MG tablet Take 2 mg by mouth 4 (four) times daily as needed for diarrhea or loose stools.  [name] losartan (COZAAR) 100 MG tablet Take 0.5 tablets (50 mg total) by mouth daily.  . metoprolol tartrate (LOPRESSOR) 50 MG tablet Take 1.5 tablets (75 mg total) by mouth 2 (two) times daily.  . pantoprazole  (PROTONIX) 40 MG tablet Take 1 tablet (40 mg total) by mouth daily. Take 30-60 min before first meal of the day  . psyllium (METAMUCIL) 58.6 % packet Take 1 packet by mouth daily.  . Tiotropium Bromide-Olodaterol (STIOLTO RESPIMAT) 2.5-2.5 MCG/ACT AERS Inhale 2 puffs into the lungs daily.  [name] triamcinolone (NASACORT ALLERGY 24HR) 55 MCG/ACT AERO nasal inhaler Place 1 spray into the nose daily.                   Objective:   Physical Exam  amb wf more jovial, no cough or throat clearing at all but grabs her neck and convinced something's stuck there  Vital signs reviewed  01/29/2020  - Note at rest 02 sats  97% on RA     01/29/2020        150  12/04/2019           148 09/26/2019         146  03/20/2019       151  02/21/2019       148 02/06/2019         148 04/30/2018      148 01/14/2018        144  12/03/2017          147  09/26/2016        141  08/07/2016          146  06/19/2016        152  05/22/2016        148   02/15/15 155 lb 6.4 oz (70.489 kg)  01/29/15 154 lb (69.854 kg)  11/26/14 153 lb 8 oz (69.627 kg)    HEENT : pt wearing mask not removed for exam  due to covid - 19 concerns.   NECK :  without JVD/Nodes/TM/ nl carotid upstrokes bilaterally   LUNGS: no acc muscle use,  Min barrel  contour chest wall with bilateral  slightly decreased bs s audible wheeze and  without cough on insp or exp maneuvers and min  Hyperresonant  to  percussion bilaterally     CV:  RRR  no s3 or murmur or increase in P2, and no edema   ABD:  soft and nontender with pos end  insp Hoover's  in the supine position. No bruits or organomegaly appreciated, bowel sounds nl  MS:   Nl gait/  ext warm without deformities, calf tenderness, cyanosis or clubbing No obvious joint restrictions   SKIN: warm and dry without lesions    NEURO:  alert, approp, nl sensorium with  no motor or cerebellar deficits apparent.                        Assessment & Plan:

## 2020-01-29 NOTE — Patient Instructions (Addendum)
Try to reduce stiolto to one puff each am to see if helps your dry mouth (think of like high octane fuel ok to adjust up down or off)  Ok to spread out the gabapentin 300 mg three times a day  If throat problems continue you will need to be referred to [facility] Dr Carol Ada   Please schedule a follow up visit in 3 months but call sooner if needed

## 2020-02-11 DIAGNOSIS — R2689 Other abnormalities of gait and mobility: Secondary | ICD-10-CM | POA: Diagnosis not present

## 2020-02-11 DIAGNOSIS — M7062 Trochanteric bursitis, left hip: Secondary | ICD-10-CM | POA: Diagnosis not present

## 2020-02-15 ENCOUNTER — Emergency Department (HOSPITAL_COMMUNITY): Payer: Medicare PPO

## 2020-02-15 ENCOUNTER — Emergency Department (HOSPITAL_COMMUNITY)
Admission: EM | Admit: 2020-02-15 | Discharge: 2020-02-15 | Disposition: A | Discharge status: Home / Self Care | Payer: Medicare PPO | Attending: Emergency Medicine | Admitting: Emergency Medicine

## 2020-02-15 ENCOUNTER — Other Ambulatory Visit: Payer: Self-pay

## 2020-02-15 DIAGNOSIS — S0083XA Contusion of other part of head, initial encounter: Secondary | ICD-10-CM

## 2020-02-15 DIAGNOSIS — S8002XA Contusion of left knee, initial encounter: Secondary | ICD-10-CM

## 2020-02-15 DIAGNOSIS — W19XXXA Unspecified fall, initial encounter: Secondary | ICD-10-CM

## 2020-02-15 DIAGNOSIS — S8001XA Contusion of right knee, initial encounter: Secondary | ICD-10-CM

## 2020-02-15 DIAGNOSIS — G319 Degenerative disease of nervous system, unspecified: Secondary | ICD-10-CM | POA: Diagnosis not present

## 2020-02-15 DIAGNOSIS — M25562 Pain in left knee: Secondary | ICD-10-CM | POA: Diagnosis not present

## 2020-02-15 DIAGNOSIS — R220 Localized swelling, mass and lump, head: Secondary | ICD-10-CM | POA: Diagnosis not present

## 2020-02-15 DIAGNOSIS — S0990XA Unspecified injury of head, initial encounter: Secondary | ICD-10-CM | POA: Diagnosis present

## 2020-02-15 DIAGNOSIS — M4312 Spondylolisthesis, cervical region: Secondary | ICD-10-CM | POA: Diagnosis not present

## 2020-02-15 DIAGNOSIS — R0902 Hypoxemia: Secondary | ICD-10-CM | POA: Diagnosis not present

## 2020-02-15 DIAGNOSIS — M47812 Spondylosis without myelopathy or radiculopathy, cervical region: Secondary | ICD-10-CM | POA: Diagnosis not present

## 2020-02-15 DIAGNOSIS — S199XXA Unspecified injury of neck, initial encounter: Secondary | ICD-10-CM | POA: Diagnosis not present

## 2020-02-15 DIAGNOSIS — W010XXA Fall on same level from slipping, tripping and stumbling without subsequent striking against object, initial encounter: Secondary | ICD-10-CM | POA: Insufficient documentation

## 2020-02-15 DIAGNOSIS — M2578 Osteophyte, vertebrae: Secondary | ICD-10-CM | POA: Diagnosis not present

## 2020-02-15 DIAGNOSIS — M7989 Other specified soft tissue disorders: Secondary | ICD-10-CM | POA: Diagnosis not present

## 2020-02-15 DIAGNOSIS — I10 Essential (primary) hypertension: Secondary | ICD-10-CM | POA: Diagnosis not present

## 2020-02-15 DIAGNOSIS — S0003XA Contusion of scalp, initial encounter: Secondary | ICD-10-CM | POA: Diagnosis not present

## 2020-02-15 LAB — CBC
HCT: 34.3 % — ABNORMAL LOW (ref 36.0–46.0)
Hemoglobin: 11.4 g/dL — ABNORMAL LOW (ref 12.0–15.0)
MCH: 30.8 pg (ref 26.0–34.0)
MCHC: 33.2 g/dL (ref 30.0–36.0)
MCV: 92.7 fL (ref 80.0–100.0)
Platelets: 292 10*3/uL (ref 150–400)
RBC: 3.7 MIL/uL — ABNORMAL LOW (ref 3.87–5.11)
RDW: 15.1 % (ref 11.5–15.5)
WBC: 7.1 10*3/uL (ref 4.0–10.5)
nRBC: 0.3 % — ABNORMAL HIGH (ref 0.0–0.2)

## 2020-02-15 LAB — I-STAT CHEM 8, ED
BUN: 41 mg/dL — ABNORMAL HIGH (ref 8–23)
Calcium, Ion: 0.99 mmol/L — ABNORMAL LOW (ref 1.15–1.40)
Chloride: 98 mmol/L (ref 98–111)
Creatinine, Ser: 1.7 mg/dL — ABNORMAL HIGH (ref 0.44–1.00)
Glucose, Bld: 110 mg/dL — ABNORMAL HIGH (ref 70–99)
HCT: 36 % (ref 36.0–46.0)
Hemoglobin: 12.2 g/dL (ref 12.0–15.0)
Potassium: 4.4 mmol/L (ref 3.5–5.1)
Sodium: 130 mmol/L — ABNORMAL LOW (ref 135–145)
TCO2: 23 mmol/L (ref 22–32)

## 2020-02-15 LAB — BASIC METABOLIC PANEL
Anion gap: 12 (ref 5–15)
BUN: 42 mg/dL — ABNORMAL HIGH (ref 8–23)
CO2: 19 mmol/L — ABNORMAL LOW (ref 22–32)
Calcium: 9.7 mg/dL (ref 8.9–10.3)
Chloride: 98 mmol/L (ref 98–111)
Creatinine, Ser: 1.7 mg/dL — ABNORMAL HIGH (ref 0.44–1.00)
GFR, Estimated: 28 mL/min — ABNORMAL LOW (ref >60)
Glucose, Bld: 113 mg/dL — ABNORMAL HIGH (ref 70–99)
Potassium: 4.4 mmol/L (ref 3.5–5.1)
Sodium: 129 mmol/L — ABNORMAL LOW (ref 135–145)

## 2020-02-15 MED ORDER — FENTANYL CITRATE (PF) 100 MCG/2ML IJ SOLN
50.0000 ug | Freq: Once | INTRAMUSCULAR | Status: AC
  Administered 2020-02-15: 50 ug via INTRAVENOUS
  Filled 2020-02-15: qty 2

## 2020-02-15 MED ORDER — PROMETHAZINE HCL 25 MG/ML IJ SOLN
12.5000 mg | Freq: Once | INTRAMUSCULAR | Status: AC
  Administered 2020-02-15: 12.5 mg via INTRAMUSCULAR

## 2020-02-15 MED ORDER — ONDANSETRON 4 MG PO TBDP
4.0000 mg | ORAL_TABLET | Freq: Once | ORAL | Status: AC
  Administered 2020-02-15: 4 mg via ORAL

## 2020-02-15 MED ORDER — DIPHENHYDRAMINE HCL 50 MG/ML IJ SOLN
12.5000 mg | Freq: Once | INTRAMUSCULAR | Status: AC
  Administered 2020-02-15: 12.5 mg via INTRAMUSCULAR
  Filled 2020-02-15: qty 1

## 2020-02-15 MED ORDER — ONDANSETRON 4 MG PO TBDP
ORAL_TABLET | ORAL | Status: AC
  Filled 2020-02-15: qty 1

## 2020-02-15 NOTE — ED Provider Notes (Signed)
[facility] EMERGENCY DEPARTMENT Provider Note   CSN: [id number] Arrival date & time: 02/15/20  1325     History Chief Complaint  Patient presents with  . Fall    [name] is a 79 y.o. female.  The history is provided by the patient.  Fall This is a new problem. The current episode started less than 1 hour ago. The problem has been resolved. Pertinent negatives include no chest pain, no abdominal pain, no headaches and no shortness of breath. Nothing aggravates the symptoms. Nothing relieves the symptoms. She has tried nothing for the symptoms. The treatment provided mild relief.       No past medical history on file.  There are no problems to display for this patient.     OB History   No obstetric history on file.     No family history on file.  Social History   Tobacco Use  . Smoking status: Not on file  Substance Use Topics  . Alcohol use: Not on file  . Drug use: Not on file    Home Medications Prior to Admission medications   Not on File    Allergies    Asa [aspirin] and Nitroglycerin  Review of Systems   Review of Systems  Constitutional: Negative for chills and fever.  HENT: Negative for ear pain and sore throat.   Eyes: Negative for pain and visual disturbance.  Respiratory: Negative for cough and shortness of breath.   Cardiovascular: Negative for chest pain and palpitations.  Gastrointestinal: Negative for abdominal pain and vomiting.  Genitourinary: Negative for dysuria and hematuria.  Musculoskeletal: Positive for joint swelling. Negative for arthralgias and back pain.  Skin: Positive for color change. Negative for rash.  Neurological: Negative for seizures, syncope and headaches.  All other systems reviewed and are negative.   Physical Exam Updated Vital Signs  ED Triage Vitals  Enc Vitals Group     BP 02/15/20 1338 (!) 188/86     Pulse Rate 02/15/20 1338 62     Resp 02/15/20 1338 14     Temp 02/15/20 1338  97.7 F (36.5 C)     Temp Source 02/15/20 1338 Oral     SpO2 02/15/20 1338 94 %     Weight 02/15/20 1332 150 lb (68 kg)     Height 02/15/20 1332 5\' 3"  (1.6 m)     Head Circumference --      Peak Flow --      Pain Score 02/15/20 1328 2     Pain Loc --      Pain Edu? --      Excl. in [place]? --     Physical Exam Vitals and nursing note reviewed.  Constitutional:      General: She is not in acute distress.    Appearance: She is well-developed. She is not ill-appearing.  HENT:     Head:     Comments: Large left forehead hematoma with no eye entrapment    Nose: Nose normal.     Mouth/Throat:     Mouth: Mucous membranes are moist.  Eyes:     Extraocular Movements: Extraocular movements intact.     Conjunctiva/sclera: Conjunctivae normal.     Pupils: Pupils are equal, round, and reactive to light.  Cardiovascular:     Rate and Rhythm: Normal rate and regular rhythm.     Pulses: Normal pulses.     Heart sounds: Normal heart sounds. No murmur heard.   Pulmonary:  Effort: Pulmonary effort is normal. No respiratory distress.     Breath sounds: Normal breath sounds.  Abdominal:     Palpations: Abdomen is soft.     Tenderness: There is no abdominal tenderness.  Musculoskeletal:        General: Tenderness (bilaterally knee swelling with pain) present. Normal range of motion.     Cervical back: Normal range of motion and neck supple. No tenderness.     Comments: Tenderness to bilateral knees with swelling to the anterior portion of the left kneecap  Skin:    General: Skin is warm and dry.     Capillary Refill: Capillary refill takes less than 2 seconds.  Neurological:     General: No focal deficit present.     Mental Status: She is alert and oriented to person, place, and time.     Cranial Nerves: No cranial nerve deficit.     Sensory: No sensory deficit.     Motor: No weakness.     Coordination: Coordination normal.     ED Results / Procedures / Treatments   Labs (all labs  ordered are listed, but only abnormal results are displayed) Labs Reviewed  CBC - Abnormal; Notable for the following components:      Result Value   RBC 3.70 (*)    Hemoglobin 11.4 (*)    HCT 34.3 (*)    nRBC 0.3 (*)    All other components within normal limits  BASIC METABOLIC PANEL - Abnormal; Notable for the following components:   Sodium 129 (*)    CO2 19 (*)    Glucose, Bld 113 (*)    BUN 42 (*)    Creatinine, Ser 1.70 (*)    GFR, Estimated 28 (*)    All other components within normal limits  I-STAT CHEM 8, ED - Abnormal; Notable for the following components:   Sodium 130 (*)    BUN 41 (*)    Creatinine, Ser 1.70 (*)    Glucose, Bld 110 (*)    Calcium, Ion 0.99 (*)    All other components within normal limits    EKG None  Radiology CT HEAD WO CONTRAST  Result Date: 02/15/2020 CLINICAL DATA:  Head trauma, fall on blood thinners. EXAM: CT HEAD WITHOUT CONTRAST TECHNIQUE: Contiguous axial images were obtained from the base of the skull through the vertex without intravenous contrast. COMPARISON:  10/07/2019 paranasal sinus CT. 01/17/2007 head CT and prior. FINDINGS: Brain: No acute infarct or intracranial hemorrhage. No mass lesion. No midline shift, ventriculomegaly or extra-axial fluid collection. Mild cerebral atrophy with ex vacuo dilatation. Vascular: No hyperdense vessel or unexpected calcification. Bilateral carotid siphon atherosclerotic calcifications. Skull: Negative for fracture or focal lesion. Sinuses/Orbits: Globes are intact. Normal appearance of the extraocular muscles and optic nerves. Clear paranasal sinuses and mastoid air cells. Other: Left frontal scalp hematoma with periorbital soft tissue swelling. IMPRESSION: Left frontal scalp hematoma and periorbital soft tissue swelling. No calvarial fracture or acute intracranial process. Electronically Signed   By: Primitivo Gauze M.D.   On: 02/15/2020 14:27   CT CERVICAL SPINE WO CONTRAST  Result Date:  02/15/2020 CLINICAL DATA:  Neck trauma. EXAM: CT CERVICAL SPINE WITHOUT CONTRAST TECHNIQUE: Multidetector CT imaging of the cervical spine was performed without intravenous contrast. Multiplanar CT image reconstructions were also generated. COMPARISON:  Concurrent head CT. 05/28/2007 cervical spine radiographs and prior. 11/29/2006 MRI cervical spine and CT cervical spine. FINDINGS: Alignment: Sequela of C3-5 ACDF. Hardware appears to be intact. Straightening of  lordosis. Minimal grade 1 C5-6 anterolisthesis. Minimal grade 1 C6-7 retrolisthesis. Skull base and vertebrae: No acute fracture. No primary bone lesion or focal pathologic process. Soft tissues and spinal canal: No prevertebral fluid or swelling. No visible canal hematoma. Disc levels: Multilevel spondylosis including endplate sclerosis, osteophytosis and disc space loss most prominent at the C5-T1 levels. Patent bony spinal canal. Patent bony neural foramen. Upper chest: Biapical scarring. Other: None. IMPRESSION: No acute fracture or traumatic listhesis.  Multilevel spondylosis. Sequela of C3-5 ACDF without adverse features. Electronically Signed   By: Primitivo Gauze M.D.   On: 02/15/2020 14:35   DG Knee Complete 4 Views Left  Result Date: 02/15/2020 CLINICAL DATA:  Fall.  Pain. EXAM: LEFT KNEE - COMPLETE 4+ VIEW COMPARISON:  None FINDINGS: Anterior soft tissue swelling. No fractures. No obvious effusion. Medial compartment chondrocalcinosis. IMPRESSION: 1. Anterior soft tissue swelling.  No fractures identified. 2. Medial compartment chondrocalcinosis suggesting CPPD. Electronically Signed   By: Dorise Bullion III M.D   On: 02/15/2020 14:37   DG Knee Complete 4 Views Right  Result Date: 02/15/2020 CLINICAL DATA:  Pain after fall EXAM: RIGHT KNEE - COMPLETE 4+ VIEW COMPARISON:  None. FINDINGS: Mild anterior swelling. No fractures identified. No obvious effusion. Degenerative changes, particularly in the lateral compartment. IMPRESSION: 1.  Mild anterior soft tissue swelling. No other acute abnormalities. Electronically Signed   By: Dorise Bullion III M.D   On: 02/15/2020 14:38    Procedures Procedures (including critical care time)  Medications Ordered in ED Medications  fentaNYL (SUBLIMAZE) injection 50 mcg (50 mcg Intravenous Given 02/15/20 1430)    ED Course  I have reviewed the triage vital signs and the nursing notes.  Pertinent labs & imaging results that were available during my care of the patient were reviewed by me and considered in my medical decision making (see chart for details).    MDM Rules/Calculators/A&P                          [name] is an 79 year old female who presents to the ED as a level 2 trauma as a fall on Plavix.  Ground-level fall.  Hematoma to left side of her head.  Pain to bilateral knees.  Mild swelling to the left knee.  Normal range of motion without much pain in the lower extremities.  CT scan of the head showed no intracranial injury.  She has a large left forehead hematoma.  Extraocular motions are intact.  No jaw pain or facial pain.  X-rays of the knees bilaterally negative for fracture or dislocation.  There is some bilateral effusions.  Patient states that she has a walker at home.  Feels safe for discharge.  Recommend ice, Tylenol, walker.  Recommend follow-up with primary care doctor and discharged from the ED in good condition.  This chart was dictated using voice recognition software.  Despite best efforts to proofread,  errors can occur which can change the documentation meaning.    Final Clinical Impression(s) / ED Diagnoses Final diagnoses:  Fall, initial encounter  Contusion of left knee, initial encounter  Contusion of right knee, initial encounter  Contusion of forehead, initial encounter    Rx / DC Orders ED Discharge Orders    None       Lennice Sites, DO 02/15/20 1518

## 2020-02-15 NOTE — ED Notes (Signed)
Awaiting resolution of Nausea to DC

## 2020-02-15 NOTE — ED Triage Notes (Signed)
Pt BIB [facility] from [facility] for a mechanical fall she tripped on a carpet divider, no LOC, takes Plavix.  Hematoma above Left eye   BP 190/100 HR 65

## 2020-02-15 NOTE — ED Notes (Signed)
Pt resting in bed. Pain improving. MD at bedside

## 2020-02-15 NOTE — Discharge Instructions (Signed)
You have contusion to your forehead into your knees bilaterally.  Recommend ice and Tylenol, use walker as needed

## 2020-02-17 ENCOUNTER — Ambulatory Visit: Payer: Medicare PPO | Admitting: Cardiology

## 2020-02-19 ENCOUNTER — Other Ambulatory Visit: Payer: Medicare PPO

## 2020-02-19 ENCOUNTER — Ambulatory Visit: Payer: Medicare PPO | Admitting: Cardiothoracic Surgery

## 2020-02-21 DIAGNOSIS — R2681 Unsteadiness on feet: Secondary | ICD-10-CM | POA: Diagnosis not present

## 2020-02-21 DIAGNOSIS — Z9181 History of falling: Secondary | ICD-10-CM | POA: Diagnosis not present

## 2020-02-21 DIAGNOSIS — M6281 Muscle weakness (generalized): Secondary | ICD-10-CM | POA: Diagnosis not present

## 2020-02-23 ENCOUNTER — Other Ambulatory Visit: Payer: Self-pay | Admitting: Adult Health

## 2020-02-23 DIAGNOSIS — Z9181 History of falling: Secondary | ICD-10-CM | POA: Diagnosis not present

## 2020-02-23 DIAGNOSIS — M6281 Muscle weakness (generalized): Secondary | ICD-10-CM | POA: Diagnosis not present

## 2020-02-23 DIAGNOSIS — R2681 Unsteadiness on feet: Secondary | ICD-10-CM | POA: Diagnosis not present

## 2020-02-25 DIAGNOSIS — M6281 Muscle weakness (generalized): Secondary | ICD-10-CM | POA: Diagnosis not present

## 2020-02-25 DIAGNOSIS — Z9181 History of falling: Secondary | ICD-10-CM | POA: Diagnosis not present

## 2020-02-25 DIAGNOSIS — R2681 Unsteadiness on feet: Secondary | ICD-10-CM | POA: Diagnosis not present

## 2020-02-26 ENCOUNTER — Telehealth: Payer: Self-pay

## 2020-02-26 ENCOUNTER — Encounter: Payer: Self-pay | Admitting: Physician Assistant

## 2020-02-26 ENCOUNTER — Telehealth (INDEPENDENT_AMBULATORY_CARE_PROVIDER_SITE_OTHER): Payer: Medicare PPO | Admitting: Physician Assistant

## 2020-02-26 VITALS — BP 135/77 | HR 82 | Ht 63.0 in | Wt 146.0 lb

## 2020-02-26 DIAGNOSIS — I10 Essential (primary) hypertension: Secondary | ICD-10-CM

## 2020-02-26 DIAGNOSIS — I25810 Atherosclerosis of coronary artery bypass graft(s) without angina pectoris: Secondary | ICD-10-CM | POA: Diagnosis not present

## 2020-02-26 DIAGNOSIS — E039 Hypothyroidism, unspecified: Secondary | ICD-10-CM | POA: Diagnosis not present

## 2020-02-26 DIAGNOSIS — Z8679 Personal history of other diseases of the circulatory system: Secondary | ICD-10-CM

## 2020-02-26 DIAGNOSIS — J449 Chronic obstructive pulmonary disease, unspecified: Secondary | ICD-10-CM | POA: Diagnosis not present

## 2020-02-26 DIAGNOSIS — K551 Chronic vascular disorders of intestine: Secondary | ICD-10-CM

## 2020-02-26 DIAGNOSIS — Z9889 Other specified postprocedural states: Secondary | ICD-10-CM

## 2020-02-26 DIAGNOSIS — E785 Hyperlipidemia, unspecified: Secondary | ICD-10-CM

## 2020-02-26 NOTE — Patient Instructions (Signed)
Medication Instructions:  Your physician recommends that you continue on your current medications as directed. Please refer to the Current Medication list given to you today.  *If you need a refill on your cardiac medications before your next appointment, please call your pharmacy*  Lab Work: Your physician recommends that you return for lab work within 6 months prior to follow up appointment with Dr. Ellyn Hack:  Fasting Lipid Panel-DO NOT EAT OR DRINK PAST MIDNIGHT. OKAY TO HAVE WATER. If you have labs (blood work) drawn today and your tests are completely normal, you will receive your results only by: [name] MyChart Message (if you have MyChart) OR . A paper copy in the mail If you have any lab test that is abnormal or we need to change your treatment, we will call you to review the results.  Testing/Procedures: NONE ordered at this time of appointment   Follow-Up: At [facility], you and your health needs are our priority.  As part of our continuing mission to provide you with exceptional heart care, we have created designated Provider Care Teams.  These Care Teams include your primary Cardiologist (physician) and Advanced Practice Providers (APPs -  Physician Assistants and Nurse Practitioners) who all work together to provide you with the care you need, when you need it.  Your next appointment:   6 month(s)  The format for your next appointment:   In Person  Provider:   Glenetta Hew, MD  Other Instructions

## 2020-02-26 NOTE — Telephone Encounter (Signed)
  Patient Consent for Virtual Visit         [name] has provided verbal consent on 02/26/2020 for a virtual visit (video or telephone).   CONSENT FOR VIRTUAL VISIT FOR:  [name]  By participating in this virtual visit I agree to the following:  I hereby voluntarily request, consent and authorize [facility] and its employed or contracted physicians, physician assistants, nurse practitioners or other licensed health care professionals (the Practitioner), to provide me with telemedicine health care services (the "Services") as deemed necessary by the treating Practitioner. I acknowledge and consent to receive the Services by the Practitioner via telemedicine. I understand that the telemedicine visit will involve communicating with the Practitioner through live audiovisual communication technology and the disclosure of certain medical information by electronic transmission. I acknowledge that I have been given the opportunity to request an in-person assessment or other available alternative prior to the telemedicine visit and am voluntarily participating in the telemedicine visit.  I understand that I have the right to withhold or withdraw my consent to the use of telemedicine in the course of my care at any time, without affecting my right to future care or treatment, and that the Practitioner or I may terminate the telemedicine visit at any time. I understand that I have the right to inspect all information obtained and/or recorded in the course of the telemedicine visit and may receive copies of available information for a reasonable fee.  I understand that some of the potential risks of receiving the Services via telemedicine include:  [name] Delay or interruption in medical evaluation due to technological equipment failure or disruption; . Information transmitted may not be sufficient (e.g. poor resolution of images) to allow for appropriate medical decision making by the Practitioner;  and/or  . In rare instances, security protocols could fail, causing a breach of personal health information.  Furthermore, I acknowledge that it is my responsibility to provide information about my medical history, conditions and care that is complete and accurate to the best of my ability. I acknowledge that Practitioner's advice, recommendations, and/or decision may be based on factors not within their control, such as incomplete or inaccurate data provided by me or distortions of diagnostic images or specimens that may result from electronic transmissions. I understand that the practice of medicine is not an exact science and that Practitioner makes no warranties or guarantees regarding treatment outcomes. I acknowledge that a copy of this consent can be made available to me via my patient portal ([facility]), or I can request a printed copy by calling the office of [facility].    I understand that my insurance will be billed for this visit.   I have read or had this consent read to me. . I understand the contents of this consent, which adequately explains the benefits and risks of the Services being provided via telemedicine.  . I have been provided ample opportunity to ask questions regarding this consent and the Services and have had my questions answered to my satisfaction. . I give my informed consent for the services to be provided through the use of telemedicine in my medical care

## 2020-02-26 NOTE — Telephone Encounter (Signed)
Called patient to discuss AVS instructions gave Hao Meng's recommendations and patient voiced understanding. AVS summary mailed to patient.    

## 2020-02-26 NOTE — Progress Notes (Signed)
Virtual Visit via Telephone Note   This visit type was conducted due to national recommendations for restrictions regarding the COVID-19 Pandemic (e.g. social distancing) in an effort to limit this patient's exposure and mitigate transmission in our community.  Due to her co-morbid illnesses, this patient is at least at moderate risk for complications without adequate follow up.  This format is felt to be most appropriate for this patient at this time.  The patient did not have access to video technology/had technical difficulties with video requiring transitioning to audio format only (telephone).  All issues noted in this document were discussed and addressed.  No physical exam could be performed with this format.  Please refer to the patient's chart for her  consent to telehealth for [facility].    Date:  02/28/2020   ID:  [name], DOB 08-Aug-1940, MRN [id number] The patient was identified using 2 identifiers.  Patient Location: Home Provider Location: Office/Clinic  PCP:  Patient, No Pcp Per  Cardiologist:  Glenetta Hew, MD  Electrophysiologist:  None   Evaluation Performed:  Follow-Up Visit  Chief Complaint:  Follow up  History of Present Illness:    [name] is a 79 y.o. female with past medical history of COPD, AAA rupture s/p repair, CAD s/p CABG x1 with LIMA to LAD in 1996, splenic artery and mesenteric artery stenosis, former tobacco use, hypertension, hyperlipidemia, hypothyroidism, polymyalgia rheumatica and history of TIA.  Bypass surgery was performed at the same time of AAA repair.  Although initially there thought to be ostial LAD disease, however follow-up cardiac catheterization showed minimal LAD lesion and atretic LIMA.  Myoview in 2012 was low risk.  She had a history of granulomatous disease followed by Dr. Servando Snare and Dr. Melvyn Novas.  She underwent left VATS procedure in 2016 and a left lower lobe biopsy in January 2020.  She was last seen virtually by Kerin Ransom in March 2021 at which time she was doing well.  [name] presents today for virtual visit.  She denies any significant chest discomfort or worsening shortness of breath.  She did have a mechanical fall recently.  She says her foot caught between the floor and the carpet and she fell forward and hit her head.  She developed a left forehead hematoma and that this was seen on the CT of the head as well.  Fortunately there was no intracranial injury and no neck fracture either.  She does not fall very frequently and this episode was due to mechanical fall rather than dizziness.  She is still taking her Plavix.  I recommended continue on the current therapy.  Blood pressure is very well controlled.  She is overdue for fasting lipid panel.  This is not urgent and can be done anytime in the next 6 months.  Her creatinine peaked in August however has since been improving.  Last creatinine 1.7 which is near her baseline.  The patient does not have symptoms concerning for COVID-19 infection (fever, chills, cough, or new shortness of breath).    Past Medical History:  Diagnosis Date  . Abdominal aortic aneurysm (HCC)    REPAIRED IN 1996 BY [name]  AND HAS RECENTLY BEEN FOLLOWED BY [name]  . 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 Adenomatous colon polyp    tubular  . Anemia   . CAD in native artery 1996, 2002, 2005  Status post CABG x1 with LIMA-LAD for ostial LAD 90% stenosis --> down to 50% in 2002 and 30% in 2005.;  Atretic LIMA; Myoview 06/2010: Fixed anteroseptal, apical and inferoapical defect with moderate size. Most likely scar. Mild subendocardial ischemia. EF 71% LOW RISK.   [name] Cervical disc disease    fracture  . Chronic kidney disease   . COPD (chronic obstructive pulmonary disease) ([place])   . Diverticulosis   . Hyperlipidemia   . Hypertension   . Hypothyroidism (acquired)    hypo  . Myocardial  infarction [facility]) Aug. 2016   TIA  . Polymyalgia rheumatica (HCC)    2011 Dr. Charlestine Night  . Rheumatoid arthritis ([place]) 2011   Dr.Truslow; fracture knees, hands and wrists -   . S/P CABG x 1 1996   CABG--LIMA-LAD for ostial LAD (not felt to be PCI amenable). EF NORMAL then; LIMA now atretic  . Shortness of breath dyspnea    with exertion  . Stroke [facility]) 11-2014   TIA   . Urinary frequency    Past Surgical History:  Procedure Laterality Date  . ABDOMINAL AORTIC ANEURYSM REPAIR  7096   Complicated by mesenteric artery stenosis and splenic artery infarction with acquired Asplenia  . APPENDECTOMY    . BUNIONECTOMY  07/2011   right foot  . CARDIAC CATHETERIZATION  2005   (Most recent CATH) - ostial LAD lesion 20-30% (down from 90% initially). Atretic LIMA. Minimal disease the RCA and Circumflex system.  [name] CARPAL TUNNEL RELEASE Left   . CATARACT EXTRACTION Bilateral   . CERVICAL SPINE SURGERY     plate 2008 Dr. Saintclair Halsted  . [place]   INCLUDED AN INTERNAL MAMMARY ARTERY TO THE LAD. EF WAS NORMAL  . INGUINAL HERNIA REPAIR Right   . LAPAROSCOPIC APPENDECTOMY N/A 06/28/2016   Procedure: APPENDECTOMY LAPAROSCOPIC;  Surgeon: Leighton Ruff, MD;  Location: [facility];  Service: General;  Laterality: N/A;  . NM MYOVIEW LTD  March 2012   Fixed anteroseptal, apical and inferoapical defect with moderate size. Most likely scar. Mild subendocardial ischemia. EF 71% LOW RISK.   [name] SPLENECTOMY    . TRANSTHORACIC ECHOCARDIOGRAM  12/2014   [facility]: Normal LV size & function. EF 55-60%,   . vagina polyp    . VIDEO ASSISTED THORACOSCOPY (VATS)/WEDGE RESECTION Left 03/02/2015   Procedure: VIDEO ASSISTED THORACOSCOPY (VATS), MINI THORACOTOMY, LEFT UPPER LOBE WEDGE, TAKE DOWN OF INTERNAL MAMMARY LESIONS, PLACEMENT OF ON-Q PUMP;  Surgeon: Grace Isaac, MD;  Location: [place];  Service: Thoracic;  Laterality: Left;  [name] VIDEO BRONCHOSCOPY N/A 03/02/2015   Procedure: BRONCHOSCOPY;   Surgeon: Grace Isaac, MD;  Location: [place];  Service: Thoracic;  Laterality: N/A;  . VIDEO BRONCHOSCOPY WITH ENDOBRONCHIAL NAVIGATION N/A 10/08/2017   Procedure: VIDEO BRONCHOSCOPY WITH ENDOBRONCHIAL NAVIGATION WITH BIOPSIES OF LEFT UPPER LOBE AND LEFT LOWER LOBE;  Surgeon: Grace Isaac, MD;  Location: [facility];  Service: Thoracic;  Laterality: N/A;     Current Meds  Medication Sig  . acetaminophen (TYLENOL) 500 MG tablet Take 500 mg by mouth every 6 (six) hours as needed (pain).   [name] amLODipine (NORVASC) 2.5 MG tablet Take 1 tablet (2.5 mg total) by mouth daily.  [name] atorvastatin (LIPITOR) 80 MG tablet TAKE 1 TABLET  DAILY  . Chlorpheniramine Maleate (CHLORPHEN PO) Take by mouth.  . Cholecalciferol (VITAMIN D3) 50 MCG (2000 UT) capsule Take 1 capsule (2,000 Units total) by mouth daily.  . clopidogrel (PLAVIX) 75  MG tablet Take 1 tablet (75 mg total) by mouth daily.  . Cyanocobalamin (B-12) 2500 MCG TABS one po qd  . famotidine (PEPCID) 20 MG tablet TAKE 1 TABLET BY MOUTH AT BEDTIME  . gabapentin (NEURONTIN) 300 MG capsule Take 1 capsule (300 mg total) by mouth 3 (three) times daily.  . hydrochlorothiazide (HYDRODIURIL) 12.5 MG tablet TAKE 1 TABLET BY MOUTH EVERY DAY  . leflunomide (ARAVA) 20 MG tablet Take 20 mg by mouth daily.   [name] levothyroxine (SYNTHROID) 88 MCG tablet Take 1 tablet (88 mcg total) by mouth daily before breakfast.  . loperamide (IMODIUM A-D) 2 MG tablet Take 2 mg by mouth 4 (four) times daily as needed for diarrhea or loose stools.  [name] losartan (COZAAR) 100 MG tablet Take 0.5 tablets (50 mg total) by mouth daily.  . metoprolol tartrate (LOPRESSOR) 50 MG tablet Take 1.5 tablets (75 mg total) by mouth 2 (two) times daily.  . pantoprazole (PROTONIX) 40 MG tablet Take 1 tablet (40 mg total) by mouth daily. Take 30-60 min before first meal of the day  . psyllium (METAMUCIL) 58.6 % packet Take 1 packet by mouth daily.  . Tiotropium Bromide-Olodaterol (STIOLTO RESPIMAT)  2.5-2.5 MCG/ACT AERS Inhale 2 puffs into the lungs daily.  [name] triamcinolone (NASACORT ALLERGY 24HR) 55 MCG/ACT AERO nasal inhaler Place 1 spray into the nose daily.      Allergies:   Asa [aspirin], Aspirin, Codeine, Nitroglycerin, and Nitroglycerin   Social History   Tobacco Use  . Smoking status: Former Smoker    Quit date: 12/04/1958    Years since quitting: 61.2  . Smokeless tobacco: Never Used  Vaping Use  . Vaping Use: Never used  Substance Use Topics  . Alcohol use: Yes    Comment: occassionally  . Drug use: No     Family Hx: The patient's family history includes Cancer in her paternal uncle; Coronary artery disease in an other family member; Diabetes in her mother; Heart disease in her mother; Hyperlipidemia in her mother, sister, son, son, and son; Hypertension in her daughter, mother, sister, son, son, son, and another family member; Stroke in her father. There is no history of Asthma or Colon cancer.  ROS:   Please see the history of present illness.     All other systems reviewed and are negative.   Prior CV studies:   The following studies were reviewed today:  Echo 03/10/2019 1. Left ventricular ejection fraction, by visual estimation, is 60 to  65%. The left ventricle has normal function. There is severely increased  left ventricular hypertrophy.  2. Left ventricular diastolic parameters are consistent with Grade I  diastolic dysfunction (impaired relaxation).  3. Global right ventricle has normal systolic function.The right  ventricular size is normal. No increase in right ventricular wall  thickness.  4. Left atrial size was normal.  5. Right atrial size was normal.  6. The mitral valve is normal in structure. Mild to moderate mitral valve  regurgitation.  7. The tricuspid valve is normal in structure. Tricuspid valve  regurgitation is trivial.  8. The aortic valve is tricuspid. Aortic valve regurgitation is not  visualized. Mild aortic valve  sclerosis without stenosis.  9. The pulmonic valve was normal in structure. Pulmonic valve  regurgitation is not visualized.  10. Mildly elevated pulmonary artery systolic pressure.   Labs/Other Tests and Data Reviewed:    EKG:  An ECG dated 06/21/2018 was personally reviewed today and demonstrated:  Normal sinus rhythm without significant ST-T  wave changes  Recent Labs: 08/08/2019: Brain Natriuretic Peptide 445 09/16/2019: TSH 2.32 12/26/2019: ALT 15 02/15/2020: BUN 41; Creatinine, Ser 1.70; Hemoglobin 12.2; Platelets 292; Potassium 4.4; Sodium 130   Recent Lipid Panel Lab Results  Component Value Date/Time   CHOL 152 11/13/2018 11:25 AM   CHOL 146 04/25/2016 08:52 AM   TRIG 57.0 11/13/2018 11:25 AM   TRIG 93 04/25/2016 08:52 AM   HDL 72.50 11/13/2018 11:25 AM   HDL 60 04/25/2016 08:52 AM   CHOLHDL 2 11/13/2018 11:25 AM   LDLCALC 68 11/13/2018 11:25 AM   LDLCALC 68 04/25/2016 08:52 AM   LDLDIRECT 162.1 10/28/2009 08:57 AM    Wt Readings from Last 3 Encounters:  02/26/20 146 lb (66.2 kg)  02/15/20 150 lb (68 kg)  01/29/20 150 lb 6.4 oz (68.2 kg)     Risk Assessment/Calculations:      Objective:    Vital Signs:  BP 135/77   Pulse 82   Ht 5\' 3"  (1.6 m)   Wt 146 lb (66.2 kg)   SpO2 95%   BMI 25.86 kg/m    VITAL SIGNS:  reviewed  ASSESSMENT & PLAN:    1. CAD s/p CABG x1: Last cardiac catheterization revealed atretic LIMA to LAD.  No significant disease in the native LAD.  2. AAA repair: No significant abdominal discomfort.  3. COPD: Followed by Dr. Melvyn Novas  4. Mesenteric artery stenosis: Asymptomatic  5. Hypertension: Blood pressure stable on current therapy  6. Hyperlipidemia: On Lipitor.  Overdue for fasting lipid panel  7. Hypothyroidism: On levothyroxine.   COVID-19 Education: The signs and symptoms of COVID-19 were discussed with the patient and how to seek care for testing (follow up with PCP or arrange E-visit).  The importance of social distancing was  discussed today.  Time:   Today, I have spent 10 minutes with the patient with telehealth technology discussing the above problems.     Medication Adjustments/Labs and Tests Ordered: Current medicines are reviewed at length with the patient today.  Concerns regarding medicines are outlined above.   Tests Ordered: Orders Placed This Encounter  Procedures  . Lipid panel    Medication Changes: No orders of the defined types were placed in this encounter.   Follow Up:  In Person in 6 month(s)  Signed, Almyra Deforest, [place]  02/28/2020 8:33 PM    [facility]

## 2020-03-01 ENCOUNTER — Encounter: Payer: Self-pay | Admitting: Internal Medicine

## 2020-03-01 NOTE — Telephone Encounter (Signed)
error 

## 2020-03-02 DIAGNOSIS — M6281 Muscle weakness (generalized): Secondary | ICD-10-CM | POA: Diagnosis not present

## 2020-03-02 DIAGNOSIS — Z9181 History of falling: Secondary | ICD-10-CM | POA: Diagnosis not present

## 2020-03-02 DIAGNOSIS — R2681 Unsteadiness on feet: Secondary | ICD-10-CM | POA: Diagnosis not present

## 2020-03-04 DIAGNOSIS — Z9181 History of falling: Secondary | ICD-10-CM | POA: Diagnosis not present

## 2020-03-04 DIAGNOSIS — R2681 Unsteadiness on feet: Secondary | ICD-10-CM | POA: Diagnosis not present

## 2020-03-04 DIAGNOSIS — M6281 Muscle weakness (generalized): Secondary | ICD-10-CM | POA: Diagnosis not present

## 2020-03-08 ENCOUNTER — Ambulatory Visit: Payer: Medicare PPO | Admitting: Cardiology

## 2020-03-09 DIAGNOSIS — Z9181 History of falling: Secondary | ICD-10-CM | POA: Diagnosis not present

## 2020-03-09 DIAGNOSIS — M6281 Muscle weakness (generalized): Secondary | ICD-10-CM | POA: Diagnosis not present

## 2020-03-09 DIAGNOSIS — R2681 Unsteadiness on feet: Secondary | ICD-10-CM | POA: Diagnosis not present

## 2020-03-10 ENCOUNTER — Other Ambulatory Visit: Payer: Self-pay

## 2020-03-10 ENCOUNTER — Encounter: Payer: Self-pay | Admitting: Internal Medicine

## 2020-03-10 ENCOUNTER — Ambulatory Visit: Payer: Medicare PPO | Admitting: Internal Medicine

## 2020-03-10 VITALS — BP 130/82 | HR 74 | Temp 98.0°F | Wt 150.4 lb

## 2020-03-10 DIAGNOSIS — N183 Chronic kidney disease, stage 3 unspecified: Secondary | ICD-10-CM | POA: Diagnosis not present

## 2020-03-10 DIAGNOSIS — S0083XS Contusion of other part of head, sequela: Secondary | ICD-10-CM | POA: Diagnosis not present

## 2020-03-10 DIAGNOSIS — S0083XA Contusion of other part of head, initial encounter: Secondary | ICD-10-CM | POA: Insufficient documentation

## 2020-03-10 DIAGNOSIS — I10 Essential (primary) hypertension: Secondary | ICD-10-CM

## 2020-03-10 DIAGNOSIS — D485 Neoplasm of uncertain behavior of skin: Secondary | ICD-10-CM

## 2020-03-10 NOTE — Progress Notes (Signed)
Subjective:  Patient ID: [name], female    DOB: [birth year]  Age: 79 y.o. MRN: [id number]  CC: Follow-up (3 month F/U)   HPI [name] presents for a fall f/u on 02/15/20  Per hx: "[name] is an 79 year old female who presents to the ED as a level 2 trauma as a fall on Plavix.  Ground-level fall.  Hematoma to left side of her head.  Pain to bilateral knees.  Mild swelling to the left knee.  Normal range of motion without much pain in the lower extremities.  CT scan of the head showed no intracranial injury.  She has a large left forehead hematoma.  Extraocular motions are intact.  No jaw pain or facial pain.  X-rays of the knees bilaterally negative for fracture or dislocation.  There is some bilateral effusions.  Patient states that she has a walker at home.  Feels safe for discharge.  Recommend ice, Tylenol, walker.  Recommend follow-up with primary care doctor and discharged from the ED in good condition."  No LOC   F/u RA, HTN, CRI  Outpatient Medications Prior to Visit  Medication Sig Dispense Refill  . acetaminophen (TYLENOL) 500 MG tablet Take 500 mg by mouth every 6 (six) hours as needed (pain).     [name] albuterol (VENTOLIN HFA) 108 (90 Base) MCG/ACT inhaler Inhale 2 puffs into the lungs every 6 (six) hours as needed for wheezing or shortness of breath. 8 g 2  . amLODipine (NORVASC) 2.5 MG tablet Take 1 tablet (2.5 mg total) by mouth daily. 90 tablet 3  . atorvastatin (LIPITOR) 80 MG tablet TAKE 1 TABLET  DAILY 90 tablet 2  . Chlorpheniramine Maleate (CHLORPHEN PO) Take by mouth.    . Cholecalciferol (VITAMIN D3) 50 MCG (2000 UT) capsule Take 1 capsule (2,000 Units total) by mouth daily. 100 capsule 3  . clopidogrel (PLAVIX) 75 MG tablet Take 1 tablet (75 mg total) by mouth daily. 90 tablet 3  . Cyanocobalamin (B-12) 2500 MCG TABS one po qd 100 tablet 3  . docusate sodium (STOOL SOFTENER) 100 MG capsule Take 100 mg by mouth 2 (two) times daily.    . famotidine  (PEPCID) 20 MG tablet TAKE 1 TABLET BY MOUTH AT BEDTIME 90 tablet 2  . gabapentin (NEURONTIN) 300 MG capsule Take 1 capsule (300 mg total) by mouth 3 (three) times daily. 90 capsule 2  . hydrochlorothiazide (HYDRODIURIL) 12.5 MG tablet TAKE 1 TABLET BY MOUTH EVERY DAY 90 tablet 3  . leflunomide (ARAVA) 20 MG tablet Take 20 mg by mouth daily.     [name] levothyroxine (SYNTHROID) 88 MCG tablet Take 1 tablet (88 mcg total) by mouth daily before breakfast. 90 tablet 3  . loperamide (IMODIUM A-D) 2 MG tablet Take 2 mg by mouth 4 (four) times daily as needed for diarrhea or loose stools.    [name] losartan (COZAAR) 100 MG tablet Take 0.5 tablets (50 mg total) by mouth daily. 90 tablet 3  . metoprolol tartrate (LOPRESSOR) 50 MG tablet Take 1.5 tablets (75 mg total) by mouth 2 (two) times daily. 270 tablet 3  . pantoprazole (PROTONIX) 40 MG tablet Take 1 tablet (40 mg total) by mouth daily. Take 30-60 min before first meal of the day 90 tablet 3  . psyllium (METAMUCIL) 58.6 % packet Take 1 packet by mouth daily.    . Tiotropium Bromide-Olodaterol (STIOLTO RESPIMAT) 2.5-2.5 MCG/ACT AERS Inhale 2 puffs into the lungs daily. 4 g 11  . triamcinolone (NASACORT  ALLERGY 24HR) 55 MCG/ACT AERO nasal inhaler Place 1 spray into the nose daily.      No facility-administered medications prior to visit.    ROS: Review of Systems  Constitutional: Positive for fatigue. Negative for activity change, appetite change, chills and unexpected weight change.  HENT: Negative for congestion, mouth sores and sinus pressure.   Eyes: Negative for visual disturbance.  Respiratory: Negative for cough and chest tightness.   Gastrointestinal: Negative for abdominal pain and nausea.  Genitourinary: Negative for difficulty urinating, frequency and vaginal pain.  Musculoskeletal: Positive for arthralgias, back pain and gait problem.  Skin: Negative for pallor and rash.  Neurological: Positive for dizziness and weakness. Negative for tremors,  numbness and headaches.  Hematological: Bruises/bleeds easily.  Psychiatric/Behavioral: Negative for confusion and sleep disturbance.    Objective:  BP 130/82 (BP Location: Left Arm)   Pulse 74   Temp 98 F (36.7 C) (Oral)   Wt 150 lb 6.4 oz (68.2 kg)   SpO2 99%   BMI 26.64 kg/m   BP Readings from Last 3 Encounters:  03/10/20 130/82  02/26/20 135/77  02/15/20 123/72    Wt Readings from Last 3 Encounters:  03/10/20 150 lb 6.4 oz (68.2 kg)  02/26/20 146 lb (66.2 kg)  02/15/20 150 lb (68 kg)    Physical Exam Constitutional:      General: She is not in acute distress.    Appearance: She is well-developed.  HENT:     Head: Normocephalic.     Right Ear: External ear normal.     Left Ear: External ear normal.     Nose: Nose normal.  Eyes:     General:        Right eye: No discharge.        Left eye: No discharge.     Conjunctiva/sclera: Conjunctivae normal.     Pupils: Pupils are equal, round, and reactive to light.  Neck:     Thyroid: No thyromegaly.     Vascular: No JVD.     Trachea: No tracheal deviation.  Cardiovascular:     Rate and Rhythm: Normal rate and regular rhythm.     Heart sounds: Normal heart sounds.  Pulmonary:     Effort: No respiratory distress.     Breath sounds: No stridor. No wheezing.  Abdominal:     General: Bowel sounds are normal. There is no distension.     Palpations: Abdomen is soft. There is no mass.     Tenderness: There is no abdominal tenderness. There is no guarding or rebound.  Musculoskeletal:        General: No tenderness.     Cervical back: Normal range of motion and neck supple.  Lymphadenopathy:     Cervical: No cervical adenopathy.  Skin:    Findings: No erythema or rash.  Neurological:     Cranial Nerves: No cranial nerve deficit.     Motor: No abnormal muscle tone.     Coordination: Coordination normal.     Deep Tendon Reflexes: Reflexes normal.  Psychiatric:        Behavior: Behavior normal.        Thought  Content: Thought content normal.        Judgment: Judgment normal.   a wart like lesion on neck,whitecysts on face Bruises, hematoma - face   Lab Results  Component Value Date   WBC 7.1 02/15/2020   HGB 12.2 02/15/2020   HCT 36.0 02/15/2020   PLT 292 02/15/2020  GLUCOSE 110 (H) 02/15/2020   CHOL 152 11/13/2018   TRIG 57.0 11/13/2018   HDL 72.50 11/13/2018   LDLDIRECT 162.1 10/28/2009   LDLCALC 68 11/13/2018   ALT 15 12/26/2019   AST 21 12/26/2019   NA 130 (L) 02/15/2020   K 4.4 02/15/2020   CL 98 02/15/2020   CREATININE 1.70 (H) 02/15/2020   BUN 41 (H) 02/15/2020   CO2 19 (L) 02/15/2020   TSH 2.32 09/16/2019   INR 0.89 05/20/2018    CT HEAD WO CONTRAST  Result Date: 02/15/2020 CLINICAL DATA:  Head trauma, fall on blood thinners. EXAM: CT HEAD WITHOUT CONTRAST TECHNIQUE: Contiguous axial images were obtained from the base of the skull through the vertex without intravenous contrast. COMPARISON:  10/07/2019 paranasal sinus CT. 01/17/2007 head CT and prior. FINDINGS: Brain: No acute infarct or intracranial hemorrhage. No mass lesion. No midline shift, ventriculomegaly or extra-axial fluid collection. Mild cerebral atrophy with ex vacuo dilatation. Vascular: No hyperdense vessel or unexpected calcification. Bilateral carotid siphon atherosclerotic calcifications. Skull: Negative for fracture or focal lesion. Sinuses/Orbits: Globes are intact. Normal appearance of the extraocular muscles and optic nerves. Clear paranasal sinuses and mastoid air cells. Other: Left frontal scalp hematoma with periorbital soft tissue swelling. IMPRESSION: Left frontal scalp hematoma and periorbital soft tissue swelling. No calvarial fracture or acute intracranial process. Electronically Signed   By: Primitivo Gauze M.D.   On: 02/15/2020 14:27   CT CERVICAL SPINE WO CONTRAST  Result Date: 02/15/2020 CLINICAL DATA:  Neck trauma. EXAM: CT CERVICAL SPINE WITHOUT CONTRAST TECHNIQUE: Multidetector CT  imaging of the cervical spine was performed without intravenous contrast. Multiplanar CT image reconstructions were also generated. COMPARISON:  Concurrent head CT. 05/28/2007 cervical spine radiographs and prior. 11/29/2006 MRI cervical spine and CT cervical spine. FINDINGS: Alignment: Sequela of C3-5 ACDF. Hardware appears to be intact. Straightening of lordosis. Minimal grade 1 C5-6 anterolisthesis. Minimal grade 1 C6-7 retrolisthesis. Skull base and vertebrae: No acute fracture. No primary bone lesion or focal pathologic process. Soft tissues and spinal canal: No prevertebral fluid or swelling. No visible canal hematoma. Disc levels: Multilevel spondylosis including endplate sclerosis, osteophytosis and disc space loss most prominent at the C5-T1 levels. Patent bony spinal canal. Patent bony neural foramen. Upper chest: Biapical scarring. Other: None. IMPRESSION: No acute fracture or traumatic listhesis.  Multilevel spondylosis. Sequela of C3-5 ACDF without adverse features. Electronically Signed   By: Primitivo Gauze M.D.   On: 02/15/2020 14:35   DG Knee Complete 4 Views Left  Result Date: 02/15/2020 CLINICAL DATA:  Fall.  Pain. EXAM: LEFT KNEE - COMPLETE 4+ VIEW COMPARISON:  None FINDINGS: Anterior soft tissue swelling. No fractures. No obvious effusion. Medial compartment chondrocalcinosis. IMPRESSION: 1. Anterior soft tissue swelling.  No fractures identified. 2. Medial compartment chondrocalcinosis suggesting CPPD. Electronically Signed   By: Dorise Bullion III M.D   On: 02/15/2020 14:37   DG Knee Complete 4 Views Right  Result Date: 02/15/2020 CLINICAL DATA:  Pain after fall EXAM: RIGHT KNEE - COMPLETE 4+ VIEW COMPARISON:  None. FINDINGS: Mild anterior swelling. No fractures identified. No obvious effusion. Degenerative changes, particularly in the lateral compartment. IMPRESSION: 1. Mild anterior soft tissue swelling. No other acute abnormalities. Electronically Signed   By: Dorise Bullion  III M.D   On: 02/15/2020 14:38    Assessment & Plan:    [name], MD

## 2020-03-10 NOTE — Patient Instructions (Signed)
Get arnica cream Rice sock - heat

## 2020-03-10 NOTE — Assessment & Plan Note (Signed)
Get arnica cream Rice sock - heat

## 2020-03-11 DIAGNOSIS — Z9181 History of falling: Secondary | ICD-10-CM | POA: Diagnosis not present

## 2020-03-11 DIAGNOSIS — M6281 Muscle weakness (generalized): Secondary | ICD-10-CM | POA: Diagnosis not present

## 2020-03-11 DIAGNOSIS — R2681 Unsteadiness on feet: Secondary | ICD-10-CM | POA: Diagnosis not present

## 2020-03-14 DIAGNOSIS — D485 Neoplasm of uncertain behavior of skin: Secondary | ICD-10-CM | POA: Insufficient documentation

## 2020-03-14 NOTE — Assessment & Plan Note (Signed)
BP Readings from Last 3 Encounters:  03/10/20 130/82  02/26/20 135/77  02/15/20 123/72

## 2020-03-15 DIAGNOSIS — C44319 Basal cell carcinoma of skin of other parts of face: Secondary | ICD-10-CM | POA: Diagnosis not present

## 2020-03-15 DIAGNOSIS — L570 Actinic keratosis: Secondary | ICD-10-CM | POA: Diagnosis not present

## 2020-03-15 DIAGNOSIS — L720 Epidermal cyst: Secondary | ICD-10-CM | POA: Diagnosis not present

## 2020-03-15 DIAGNOSIS — D485 Neoplasm of uncertain behavior of skin: Secondary | ICD-10-CM | POA: Diagnosis not present

## 2020-03-15 NOTE — Assessment & Plan Note (Signed)
Central neck wartlike lesion.  Rule out cancer.  Dermatology consult

## 2020-03-15 NOTE — Assessment & Plan Note (Signed)
Can you just continue to monitor renal function.  Hydrate well

## 2020-03-16 DIAGNOSIS — Z9181 History of falling: Secondary | ICD-10-CM | POA: Diagnosis not present

## 2020-03-16 DIAGNOSIS — R2681 Unsteadiness on feet: Secondary | ICD-10-CM | POA: Diagnosis not present

## 2020-03-16 DIAGNOSIS — M6281 Muscle weakness (generalized): Secondary | ICD-10-CM | POA: Diagnosis not present

## 2020-03-17 DIAGNOSIS — M6281 Muscle weakness (generalized): Secondary | ICD-10-CM | POA: Diagnosis not present

## 2020-03-17 DIAGNOSIS — R2681 Unsteadiness on feet: Secondary | ICD-10-CM | POA: Diagnosis not present

## 2020-03-17 DIAGNOSIS — Z9181 History of falling: Secondary | ICD-10-CM | POA: Diagnosis not present

## 2020-03-18 ENCOUNTER — Ambulatory Visit
Admission: RE | Admit: 2020-03-18 | Discharge: 2020-03-18 | Disposition: A | Discharge status: Home / Self Care | Payer: Medicare PPO | Source: Ambulatory Visit | Attending: Cardiothoracic Surgery | Admitting: Cardiothoracic Surgery

## 2020-03-18 ENCOUNTER — Ambulatory Visit: Payer: Medicare PPO | Admitting: Cardiothoracic Surgery

## 2020-03-18 ENCOUNTER — Encounter: Payer: Self-pay | Admitting: Cardiothoracic Surgery

## 2020-03-18 ENCOUNTER — Other Ambulatory Visit: Payer: Self-pay

## 2020-03-18 VITALS — BP 106/47 | HR 84 | Temp 98.2°F | Resp 18 | Wt 146.0 lb

## 2020-03-18 DIAGNOSIS — M6281 Muscle weakness (generalized): Secondary | ICD-10-CM | POA: Diagnosis not present

## 2020-03-18 DIAGNOSIS — J984 Other disorders of lung: Secondary | ICD-10-CM | POA: Diagnosis not present

## 2020-03-18 DIAGNOSIS — Z9181 History of falling: Secondary | ICD-10-CM | POA: Diagnosis not present

## 2020-03-18 DIAGNOSIS — R911 Solitary pulmonary nodule: Secondary | ICD-10-CM

## 2020-03-18 DIAGNOSIS — I700 Atherosclerosis of aorta: Secondary | ICD-10-CM | POA: Diagnosis not present

## 2020-03-18 DIAGNOSIS — R2681 Unsteadiness on feet: Secondary | ICD-10-CM | POA: Diagnosis not present

## 2020-03-18 DIAGNOSIS — I2510 Atherosclerotic heart disease of native coronary artery without angina pectoris: Secondary | ICD-10-CM | POA: Diagnosis not present

## 2020-03-18 DIAGNOSIS — R918 Other nonspecific abnormal finding of lung field: Secondary | ICD-10-CM | POA: Diagnosis not present

## 2020-03-18 NOTE — Progress Notes (Signed)
[address]       [place] [address]             [phone]      Pessy E Montuori [facility] Medical Record #[id number] Date of Birth: 1940/10/08  Referring: Tanda Rockers, MD Primary Care: Patient, No Pcp Per  Chief Complaint:   POST OP FOLLOW UP 10/08/2017 PREOPERATIVE DIAGNOSIS:  Left lower and left upper lobe lung nodules. POSTOPERATIVE DIAGNOSIS:  Left lower and left upper lobe lung nodules. SURGICAL PROCEDURE:  Video bronchoscopy, electromagnetic navigation bronchoscopy with biopsies of left lower lobe and left upper lobe lung mass. SURGEON:  Lilia Argue. Servando Snare, MD  03/02/2015 OPERATIVE REPORT PREOPERATIVE DIAGNOSIS: Left upper lobe lung mass POSTOPERATIVE DIAGNOSIS: Left upper lobe lung mass. 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 PROCEDURE PERFORMED: Video bronchoscopy, left video-assisted thoracoscopy, minithoracotomy, wedge resection of left upper lobe. SURGEON: Lanelle Bal, M.D Previous left VATS lung resection Lung, wedge biopsy/resection, left upper lobe - NECROTIZING GRANULOMA, 1.7 CM. - NO TUMOR SEEN. - SEE COMMENT. Microscopic Comment Sections demonstrate a 1.7 cm necrotizing granuloma. There is associated reactive pneumocyte hyperplasia with reactive atypia but no true tumor is identified. An AFB stain is performed on a representative section which is negative for acid fast bacilli. A GMS and PAS stain are also performed on a representative section which are both negative for fungal organisms. Although specimen stains for microorganisms are negative, stains are not as sensitive as other microbiologic techniques including culture. Please correlate with culture results. (RAH:gt, 03/04/15) [name] Pathologist, Electronic Signature (Case signed 03/04/2015  History of Present Illness:   Patient returns to the office today for follow-up CT scan of the chest.  She is been followed since 2016 for waxing and waning  inflammatory lesions in the lung.  A wedge resection of the left upper lobe was done in 2016, needle biopsy of left lower lobe lung lesion was done in January 2020.      CT directed needle biopsy was done May 20, 2018, the final result of this biopsy noted below but again was inflammatory in nature  Since last seen she has had no new symptoms no cough fever chills.  She had increasing breathlessness especially with activity.  She notes she is no longer able to carry anything or climb stairs.  The patient comes in today with a repeat CT of the chest-  She did smoke for 5-7 years but quit at age 9.   She has had Covid vaccination x3   Past Medical History:  Diagnosis Date  . Abdominal aortic aneurysm (HCC)    REPAIRED IN 1996 BY [name]  AND HAS RECENTLY BEEN FOLLOWED BY [name]  . 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 Adenomatous colon polyp    tubular  . Anemia   . CAD in native artery 1996, 2002, 2005    Status post CABG x1 with LIMA-LAD for ostial LAD 90% stenosis --> down to 50% in 2002 and 30% in 2005.;  Atretic LIMA; Myoview 06/2010: Fixed anteroseptal, apical and inferoapical defect with moderate size. Most likely scar. Mild subendocardial ischemia. EF 71% LOW RISK.   [name] Cervical disc disease    fracture  . Chronic kidney disease   . COPD (chronic obstructive pulmonary disease) ([place])   . Diverticulosis   . Hyperlipidemia   . Hypertension   . Hypothyroidism (acquired)  hypo  . Myocardial infarction [facility]) Aug. 2016   TIA  . Polymyalgia rheumatica (HCC)    2011 Dr. Charlestine Night  . Rheumatoid arthritis ([place]) 2011   Dr.Truslow; fracture knees, hands and wrists -   . S/P CABG x 1 1996   CABG--LIMA-LAD for ostial LAD (not felt to be PCI amenable). EF NORMAL then; LIMA now atretic  . Shortness of breath dyspnea    with exertion  . Stroke [facility]) 11-2014   TIA   . Urinary frequency      Social  History   Tobacco Use  Smoking Status Former Smoker  . Quit date: 12/04/1958  . Years since quitting: 61.3  Smokeless Tobacco Never Used    Social History   Substance and Sexual Activity  Alcohol Use Yes   Comment: occassionally     Allergies  Allergen Reactions  . Asa [Aspirin]   . Aspirin Other (See Comments)    Hurts stomach  . Codeine Nausea And Vomiting  . Nitroglycerin Other (See Comments)    Heart rate drops  . Nitroglycerin     Current Outpatient Medications  Medication Sig Dispense Refill  . acetaminophen (TYLENOL) 500 MG tablet Take 500 mg by mouth every 6 (six) hours as needed (pain).     [name] albuterol (VENTOLIN HFA) 108 (90 Base) MCG/ACT inhaler Inhale 2 puffs into the lungs every 6 (six) hours as needed for wheezing or shortness of breath. 8 g 2  . amLODipine (NORVASC) 2.5 MG tablet Take 1 tablet (2.5 mg total) by mouth daily. 90 tablet 3  . atorvastatin (LIPITOR) 80 MG tablet TAKE 1 TABLET  DAILY 90 tablet 2  . Chlorpheniramine Maleate (CHLORPHEN PO) Take by mouth.    . Cholecalciferol (VITAMIN D3) 50 MCG (2000 UT) capsule Take 1 capsule (2,000 Units total) by mouth daily. 100 capsule 3  . clopidogrel (PLAVIX) 75 MG tablet Take 1 tablet (75 mg total) by mouth daily. 90 tablet 3  . Cyanocobalamin (B-12) 2500 MCG TABS one po qd 100 tablet 3  . docusate sodium (STOOL SOFTENER) 100 MG capsule Take 100 mg by mouth 2 (two) times daily.    . famotidine (PEPCID) 20 MG tablet TAKE 1 TABLET BY MOUTH AT BEDTIME 90 tablet 2  . gabapentin (NEURONTIN) 300 MG capsule Take 1 capsule (300 mg total) by mouth 3 (three) times daily. 90 capsule 2  . hydrochlorothiazide (HYDRODIURIL) 12.5 MG tablet TAKE 1 TABLET BY MOUTH EVERY DAY 90 tablet 3  . leflunomide (ARAVA) 20 MG tablet Take 20 mg by mouth daily.     [name] levothyroxine (SYNTHROID) 88 MCG tablet Take 1 tablet (88 mcg total) by mouth daily before breakfast. 90 tablet 3  . loperamide (IMODIUM A-D) 2 MG tablet Take 2 mg by mouth 4  (four) times daily as needed for diarrhea or loose stools.    [name] losartan (COZAAR) 100 MG tablet Take 0.5 tablets (50 mg total) by mouth daily. 90 tablet 3  . metoprolol tartrate (LOPRESSOR) 50 MG tablet Take 1.5 tablets (75 mg total) by mouth 2 (two) times daily. 270 tablet 3  . pantoprazole (PROTONIX) 40 MG tablet Take 1 tablet (40 mg total) by mouth daily. Take 30-60 min before first meal of the day 90 tablet 3  . psyllium (METAMUCIL) 58.6 % packet Take 1 packet by mouth daily.    . Tiotropium Bromide-Olodaterol (STIOLTO RESPIMAT) 2.5-2.5 MCG/ACT AERS Inhale 2 puffs into the lungs daily. 4 g 11  . triamcinolone (NASACORT ALLERGY 24HR) 55 MCG/ACT  AERO nasal inhaler Place 1 spray into the nose daily.      No current facility-administered medications for this visit.   No change in review of systems since last week   Physical Exam: BP (!) 106/47 (BP Location: Right Arm, Patient Position: Sitting, Cuff Size: Normal)   Pulse 84   Temp 98.2 F (36.8 C) (Skin)   Resp 18   Wt 146 lb (66.2 kg)   SpO2 94% Comment: RA  BMI 25.86 kg/m  General appearance: alert and cooperative Neck: no adenopathy, no carotid bruit, no JVD, supple, symmetrical, trachea midline and thyroid not enlarged, symmetric, no tenderness/mass/nodules Lymph nodes: Cervical, supraclavicular, and axillary nodes normal. Resp: clear to auscultation bilaterally Cardio: regular rate and rhythm, S1, S2 normal, no murmur, click, rub or gallop GI: soft, non-tender; bowel sounds normal; no masses,  no organomegaly Extremities: extremities normal, atraumatic, no cyanosis or edema and Homans sign is negative, no sign of DVT Neurologic: Grossly normal  Diagnostic Studies & Laboratory data:     Recent Radiology Findings:  CT Chest Wo Contrast  Result Date: 03/18/2020 CLINICAL DATA:  Pulmonary nodule. EXAM: CT CHEST WITHOUT CONTRAST TECHNIQUE: Multidetector CT imaging of the chest was performed following the standard protocol  without IV contrast. COMPARISON:  Chest CT 07/03/2019 FINDINGS: Cardiovascular: The heart size is normal. No substantial pericardial effusion. Coronary artery calcification is evident. Atherosclerotic calcification is noted in the wall of the thoracic aorta. Status post CABG. Mediastinum/Nodes: No mediastinal lymphadenopathy. No evidence for gross hilar lymphadenopathy although assessment is limited by the lack of intravenous contrast on today's study. The esophagus has normal imaging features. Interval progression of ill-defined upper normal left axillary nodes measuring up to 8 mm short axis. Likely reactive. Correlation for recent left upper extremity vaccination recommended. Lungs/Pleura: Trace pleuroparenchymal scarring noted right apex. Surgical scarring noted medial left upper lobe from prior wedge resection. The linear nodular opacity adjacent to the staple line measured previously at 7 mm is stable at 7 mm today (71/8). Air cyst in the left base measured previously at 1.3 x 1.3 cm is stable at 1.3 x 1.3 cm today with a thin smooth peripheral wall. Subpleural nodule identified as new on the previous study is stable measuring 7 x 4 mm today (106/8) compared to 8 x 5 mm previously. No new suspicious nodule or mass. No focal airspace consolidation. No pleural effusion. 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 Musculoskeletal: No worrisome lytic or sclerotic osseous abnormality. IMPRESSION: 1. The subpleural peripheral right lower lobe nodule identified as new on the previous study and measured previously at 7 mm is stable since that exam. Consider repeat CT in 12 months to ensure continued stability. Stable air cyst left lower lobe with thin smooth peripheral wall, measuring 1.3 cm today. 2. Remaining pulmonary nodules are stable. No new pulmonary nodule or mass on today's study. 3. Interval progression of ill-defined upper normal left axillary nodes measuring up  to 8 mm short axis. Not enlarged by CT criteria and likely reactive. Correlation for recent left upper extremity vaccination may prove helpful. 4. Aortic Atherosclerosis (ICD10-I70.0). Electronically Signed   By: Misty Stanley M.D.   On: 03/18/2020 14:50   CT CHEST WO CONTRAST  Result Date: 07/03/2019 CLINICAL DATA:  Follow-up pulmonary nodules. EXAM: CT CHEST WITHOUT CONTRAST TECHNIQUE: Multidetector CT imaging of the chest was performed following the standard protocol without IV contrast. COMPARISON:  01/03/2018 FINDINGS: Cardiovascular: Previous median sternotomy and CABG procedure. No  pericardial effusion. Aortic atherosclerosis. Mediastinum/Nodes: No enlarged mediastinal or hilar adenopathy. The thyroid gland appears atrophic. The trachea appears patent and is midline. Normal appearance of the esophagus. Lungs/Pleura: Previous wedge resection identified within the left upper lobe. The index lung nodule near the suture line in the left upper lobe measures 0.6 cm, image 20/8. Previously 0.7 cm. The previous left lower lobe solid nodule now appears predominantly cystic measuring 1.3 x 1.3 cm, image 118/8. Previously this measured 2.4 x 1.7 cm. There is a new subpleural nodule within the periphery of the right lower lobe which measures 0.8 x 0.5 cm, image 113/8. Upper Abdomen: There is hypertrophy of the lateral segment of left lobe of liver. The contour the liver appears slightly irregular. No acute abnormality noted within the upper abdomen. Cysts within the upper pole of left kidney are identified and incompletely characterized without IV contrast. Musculoskeletal: No chest wall mass or suspicious bone lesions identified. IMPRESSION: 1. The previous solid-appearing nodule within the left lower lobe has decreased in size in the interval and now appears cystic. 2. Left upper lobe lung nodule is slightly decreased in size from previous exam. 3. There is a new lung nodule within the periphery of the right lower  lobe which has a mean diameter of 7 mm. This is indeterminate. A small neoplasm cannot be excluded 4.  Aortic Atherosclerosis (ICD10-I70.0). Electronically Signed   By: Kerby Moors M.D.   On: 07/03/2019 14:12    I have independently reviewed the above radiology studies  and reviewed the findings with the patient.     Ct Super D Chest Wo Contrast  Result Date: 10/04/2017 CLINICAL DATA:  Lung nodule. EXAM: CT CHEST WITHOUT CONTRAST TECHNIQUE: Multidetector CT imaging of the chest was performed using thin slice collimation for electromagnetic bronchoscopy planning purposes, without intravenous contrast. COMPARISON:  CT chest 06/28/2017. FINDINGS: Cardiovascular: The heart size appears within normal limits. No pericardial effusion. Previous median sternotomy and CABG procedure. Aortic atherosclerosis noted. Mediastinum/Nodes: Normal appearance of the thyroid gland. The trachea appears patent and is midline. Normal appearance of the esophagus. No enlarged mediastinal or hilar lymph nodes. No axillary or supraclavicular adenopathy. Lungs/Pleura: Postoperative changes involving the left upper lobe are again noted. The index nodule within the left upper lobe measures 7 mm, image 18/8. This is compared with 4 mm previously. The index nodule within the left lower lobe measures 1.2 x 1.2 cm, image 117/8. Previously 1.1 x 0.9 cm. 4 mm right lower lobe lung nodule is noted, image 125/8. Previously this measured the same. Within the medial right lower lobe there is an 8 mm subpleural nodule, image 72/2. Stable from comparison exam. In the right upper lobe there is a small nodule measuring 5 mm, image 43/8. Stable from previous exam. Upper Abdomen: Lobular appearing left kidney noted. The adrenal glands are unremarkable. No acute abnormality noted. Musculoskeletal: Mild degenerative disc disease noted throughout the thoracic spine. No suspicious bone lesions. IMPRESSION: 1. There has been increase in size of left lower  lobe lung nodule compared with 06/28/2017. Small left upper lobe lung nodule is also increased in size. 2. Right lower lobe and right upper lobe lung nodules are stable in the interval. 3.  Aortic Atherosclerosis (ICD10-I70.0). Electronically Signed   By: Kerby Moors M.D.   On: 10/04/2017 10:57       CLINICAL DATA:  Subsequent treatment strategy for lung nodule.  EXAM: NUCLEAR MEDICINE PET SKULL BASE TO THIGH  TECHNIQUE: 7.8 mCi F-18 FDG was injected  intravenously. Full-ring PET imaging was performed from the skull base to thigh after the radiotracer. CT data was obtained and used for attenuation correction and anatomic localization.  Fasting blood glucose: 100 mg/dl  Mediastinal blood pool activity: SUV max 3.2  COMPARISON:  CT chest 06/28/2017, 06/08/2016, 04/15/2016, PET 02/10/2015 and CT chest 01/29/2015.  FINDINGS: NECK: No hypermetabolic lymph nodes in the neck.  Incidental CT findings: None.  CHEST: Nodular scarring in the medial left upper lobe does not show abnormal hypermetabolism. A 10 mm round nodule in the inferior left lower lobe has an SUV max 3.4, is stable in size from 06/28/2017 but new from CT chest 06/08/2016. No hypermetabolic mediastinal, hilar or axillary lymph nodes.  Incidental CT findings: Atherosclerotic calcification of the arterial vasculature. Heart is enlarged. No pericardial or pleural effusion. 6 mm apical left upper lobe nodule (series 8, image 11), too small for PET resolution.  ABDOMEN/PELVIS: No abnormal hypermetabolism in the liver, adrenal glands, spleen or pancreas. No hypermetabolic lymph nodes.  Incidental CT findings: Low-attenuation lesions in the kidneys are difficult to further characterize without post-contrast imaging. Splenectomy.  SKELETON: No abnormal osseous hypermetabolism.  Incidental CT findings: Degenerative changes in the spine.  IMPRESSION: 1. Mildly hypermetabolic left lower lobe nodule,  stable in size from 06/28/2017 but new from 06/08/2016. Findings are indeterminate with both malignant and infectious etiologies considered, given the short interval from 06/28/2017. Consider follow-up CT chest in 3-4 weeks in further initial evaluation. 2. 6 mm apical left upper lobe nodule is too small for PET resolution. 3.  Aortic atherosclerosis (ICD10-170.0).   Electronically Signed   By: Lorin Picket M.D.   On: 07/05/2017 09:09     Dg Chest 2 View  Result Date: 04/17/2016 CLINICAL DATA:  Productive cough for the past month. EXAM: CHEST  2 VIEW COMPARISON:  04/15/2016; 07/22/2015; chest CT - 04/15/2016 FINDINGS: Grossly unchanged cardiac silhouette and mediastinal contours post median sternotomy. Atherosclerotic plaque within the thoracic aorta. The lungs remain hyperexpanded with flattening of the diaphragms and thinning of the biapical pulmonary parenchyma. Known ill-defined soft tissue about the left upper lobe surgical resection site is not well demonstrated on the present examination. No focal airspace opacities. No pleural effusion or pneumothorax. No evidence of edema. IMPRESSION: 1. Hyperexpanded lungs without acute cardiopulmonary disease. Emphysema. (ICD10-J43.9) 2. Previously identified soft tissue about the left upper lobe surgical resection site is suboptimally evaluated on the present examination. Correlation with report from chest CT performed 04/15/2016 is recommended. Electronically Signed   By: Sandi Mariscal M.D.   On: 04/17/2016 15:42     Ct Angio Chest Pe W And/or Wo Contrast  Result Date: 04/15/2016 CLINICAL DATA:  Elevated D-dimer.  Productive cough congestion. EXAM: CT ANGIOGRAPHY CHEST WITH CONTRAST TECHNIQUE: Multidetector CT imaging of the chest was performed using the standard protocol during bolus administration of intravenous contrast. Multiplanar CT image reconstructions and MIPs were obtained to evaluate the vascular anatomy. CONTRAST:  80 cc Isovue  370 IV COMPARISON:  11/23/2015 FINDINGS: Cardiovascular: No filling defects in the pulmonary arteries to suggest pulmonary emboli. Heart is normal size. Prior CABG. Aorta is tortuous with scattered calcifications, non aneurysmal. Mediastinum/Nodes: Mildly enlarged mediastinal lymph nodes. Subcarinal lymph node has a short axis diameter of 13 mm. Other small scattered paratracheal and AP window lymph nodes. No hilar or axillary adenopathy. Lungs/Pleura: Increasing density noted in the medial left upper lobe in the area of prior postoperative change. This soft tissue measures 2.2 x 1.7 cm on image 15.  Given the location in the postoperative bed, this is concerning for tumor recurrence. Nodule in the left lower lobe on image 38 measures 8 mm on image 38, stable. Scarring noted in the lung bases. No pleural effusions. Upper Abdomen: Imaging into the upper abdomen shows no acute findings. Musculoskeletal: Chest wall soft tissues are unremarkable. No acute bony abnormality or focal bone lesion. Review of the MIP images confirms the above findings. IMPRESSION: Enlarging abnormal soft tissue in the left upper lobe at the prior surgical site concerning for recurrent tumor. This measures up to 2.2 cm. This could be further evaluated with PET CT. Stable left lower lobe pulmonary nodule. Borderline and mildly enlarged mediastinal lymph nodes. Electronically Signed   By: Rolm Baptise M.D.   On: 04/15/2016 15:26    Ct Chest Wo Contrast  Result Date: 11/23/2015 CLINICAL DATA:  Followup left lung nodule. Previous wedge resection revealed necrotizing granuloma. EXAM: CT CHEST WITHOUT CONTRAST TECHNIQUE: Multidetector CT imaging of the chest was performed following the standard protocol without IV contrast. COMPARISON:  01/29/2015 FINDINGS: Cardiovascular: Heart size within normal limits. Aortic atherosclerosis noted. Previous CABG. Mediastinum/Lymph Nodes: No masses or pathologically enlarged lymph nodes identified on this  un-enhanced exam. Lungs/Pleura: Postop changes seen in the medial left upper lobe from previous wedge resection. 8 mm mean diameter perifissural nodule nodule is seen in the left midlung on image 66/4. This is new since previous study, but likely represents an intrapulmonary lymph node. Mild centrilobular emphysema noted. Bibasilar scarring and tiny sub-cm posterior right lower lobe pulmonary nodule remains stable. No evidence of acute infiltrate or pleural effusion. Upper abdomen: No acute findings. Musculoskeletal: No chest wall mass or suspicious bone lesions identified. IMPRESSION: Postop changes in left upper lobe. 8 mm mean diameter perifissural nodule in the left midlung is new, but likely represents an intrapulmonary lymph node. Non-contrast chest CT at 6-12 months is recommended. If the nodule is stable at time of repeat CT, then future CT at 18-24 months (from today's scan) is considered optional for low-risk patients, but is recommended for high-risk patients. This recommendation follows the consensus statement: Guidelines for Management of Incidental Pulmonary Nodules Detected on CT Images:From the Fleischner Society 2017; published online before print (10.1148/radiol.4128786767). Aortic atherosclerosis and emphysema. Electronically Signed   By: Earle Gell M.D.   On: 11/23/2015 15:46    Recent Lab Findings: Lab Results  Component Value Date   WBC 7.1 02/15/2020   HGB 12.2 02/15/2020   HCT 36.0 02/15/2020   PLT 292 02/15/2020   GLUCOSE 110 (H) 02/15/2020   CHOL 152 11/13/2018   TRIG 57.0 11/13/2018   HDL 72.50 11/13/2018   LDLDIRECT 162.1 10/28/2009   LDLCALC 68 11/13/2018   ALT 15 12/26/2019   AST 21 12/26/2019   NA 130 (L) 02/15/2020   K 4.4 02/15/2020   CL 98 02/15/2020   CREATININE 1.70 (H) 02/15/2020   BUN 41 (H) 02/15/2020   CO2 19 (L) 02/15/2020   TSH 2.32 09/16/2019   INR 0.89 05/20/2018   10/2015   03/2016   PFT's 10 /2016 FEV1 1.58 80% DLCO 16.27  71%  Interpretation: The FEV1 is normal, but the FEV1/FVC ratio and FEF25-75% are reduced. While the TLC, FRC and SVC are within normal limits, the RV is increased. Following administration of bronchodilators, there is a slight response. The reduced diffusing capacity indicates a mild loss of functional alveolar capillary surface. However, the diffusing capacity was not corrected for the patient's hemoglobin. Pulmonary Function Diagnosis: Minimal  Obstructive Airways Disease with reversibility Mild Diffusion Defect            Recent pathology needle biopsy: Diagnosis Lung, needle/core biopsy(ies), left lower - NECROSIS WITH FOCAL FIBROSIS. - SEE MICROSCOPIC DESCRIPTION. - NO MALIGNANCY IDENTIFIED. Microscopic Comment The core biopsies consist mostly of necrotic debris with one core partially consisting of connective tissue with fibrosis and granulation tissue. No giant cells or malignancy are identified. Special stains for microorganisms will be performed and reported as an addendum. (JDP:ecj 05/21/2018) Claudette Laws MD  ADDITIONAL INFORMATION: Special stains are performed and no acid fat bacilli are identified with AFB stain and no fungi are identified with GMS or PAS stains. (JDP:ecj 05/22/2018) Claudette Laws MD  Assessment / Plan:   Continued waxing and waning of pulmonary nodules, current CT appears stable -  Patient is followed by the pulmonary service-with the current stability and without further need for operative intervention will leave follow-up CT decision making to pulmonary service.  This is been discussed with the patient.  Agreeable.     Grace Isaac MD      [address] [place] [address] Office [phone]   Beeper [phone]  03/22/2020 12:59 PM

## 2020-03-19 DIAGNOSIS — Z9181 History of falling: Secondary | ICD-10-CM | POA: Diagnosis not present

## 2020-03-19 DIAGNOSIS — M6281 Muscle weakness (generalized): Secondary | ICD-10-CM | POA: Diagnosis not present

## 2020-03-19 DIAGNOSIS — R2681 Unsteadiness on feet: Secondary | ICD-10-CM | POA: Diagnosis not present

## 2020-03-22 DIAGNOSIS — R2681 Unsteadiness on feet: Secondary | ICD-10-CM | POA: Diagnosis not present

## 2020-03-22 DIAGNOSIS — M6281 Muscle weakness (generalized): Secondary | ICD-10-CM | POA: Diagnosis not present

## 2020-03-22 DIAGNOSIS — Z9181 History of falling: Secondary | ICD-10-CM | POA: Diagnosis not present

## 2020-03-23 DIAGNOSIS — Z1231 Encounter for screening mammogram for malignant neoplasm of breast: Secondary | ICD-10-CM | POA: Diagnosis not present

## 2020-03-23 DIAGNOSIS — M810 Age-related osteoporosis without current pathological fracture: Secondary | ICD-10-CM | POA: Diagnosis not present

## 2020-03-23 DIAGNOSIS — Z124 Encounter for screening for malignant neoplasm of cervix: Secondary | ICD-10-CM | POA: Diagnosis not present

## 2020-03-23 LAB — HM MAMMOGRAPHY

## 2020-03-24 NOTE — Progress Notes (Signed)
Please call patient and let her know her CT scan show that the nodule we were concerned about on the previous CT scan is stable, and that she will need a 12 month follow up to ensure it is stable.  She does have some reactive lymph node enlargement on the left. It may just be related to her recent flu vaccine she got 10/21. Would you mind asking her if she got it in her left arm?  She just needs a 12 month follow up. Thanks so much

## 2020-03-29 DIAGNOSIS — Z9181 History of falling: Secondary | ICD-10-CM | POA: Diagnosis not present

## 2020-03-29 DIAGNOSIS — R2681 Unsteadiness on feet: Secondary | ICD-10-CM | POA: Diagnosis not present

## 2020-03-29 DIAGNOSIS — M6281 Muscle weakness (generalized): Secondary | ICD-10-CM | POA: Diagnosis not present

## 2020-03-30 ENCOUNTER — Ambulatory Visit: Payer: Medicare PPO | Admitting: Internal Medicine

## 2020-03-31 DIAGNOSIS — Z9181 History of falling: Secondary | ICD-10-CM | POA: Diagnosis not present

## 2020-03-31 DIAGNOSIS — R2681 Unsteadiness on feet: Secondary | ICD-10-CM | POA: Diagnosis not present

## 2020-03-31 DIAGNOSIS — M6281 Muscle weakness (generalized): Secondary | ICD-10-CM | POA: Diagnosis not present

## 2020-04-01 ENCOUNTER — Other Ambulatory Visit: Payer: Self-pay | Admitting: Internal Medicine

## 2020-04-02 ENCOUNTER — Encounter: Payer: Self-pay | Admitting: Internal Medicine

## 2020-04-02 DIAGNOSIS — R2681 Unsteadiness on feet: Secondary | ICD-10-CM | POA: Diagnosis not present

## 2020-04-02 DIAGNOSIS — Z9181 History of falling: Secondary | ICD-10-CM | POA: Diagnosis not present

## 2020-04-02 DIAGNOSIS — M6281 Muscle weakness (generalized): Secondary | ICD-10-CM | POA: Diagnosis not present

## 2020-04-05 DIAGNOSIS — Z9181 History of falling: Secondary | ICD-10-CM | POA: Diagnosis not present

## 2020-04-05 DIAGNOSIS — R2681 Unsteadiness on feet: Secondary | ICD-10-CM | POA: Diagnosis not present

## 2020-04-05 DIAGNOSIS — M6281 Muscle weakness (generalized): Secondary | ICD-10-CM | POA: Diagnosis not present

## 2020-04-07 DIAGNOSIS — M6281 Muscle weakness (generalized): Secondary | ICD-10-CM | POA: Diagnosis not present

## 2020-04-07 DIAGNOSIS — Z9181 History of falling: Secondary | ICD-10-CM | POA: Diagnosis not present

## 2020-04-07 DIAGNOSIS — R2681 Unsteadiness on feet: Secondary | ICD-10-CM | POA: Diagnosis not present

## 2020-04-07 DIAGNOSIS — Z79899 Other long term (current) drug therapy: Secondary | ICD-10-CM | POA: Diagnosis not present

## 2020-04-07 DIAGNOSIS — M0579 Rheumatoid arthritis with rheumatoid factor of multiple sites without organ or systems involvement: Secondary | ICD-10-CM | POA: Diagnosis not present

## 2020-04-09 DIAGNOSIS — Z9181 History of falling: Secondary | ICD-10-CM | POA: Diagnosis not present

## 2020-04-09 DIAGNOSIS — M6281 Muscle weakness (generalized): Secondary | ICD-10-CM | POA: Diagnosis not present

## 2020-04-09 DIAGNOSIS — R2681 Unsteadiness on feet: Secondary | ICD-10-CM | POA: Diagnosis not present

## 2020-04-12 DIAGNOSIS — M6281 Muscle weakness (generalized): Secondary | ICD-10-CM | POA: Diagnosis not present

## 2020-04-12 DIAGNOSIS — Z9181 History of falling: Secondary | ICD-10-CM | POA: Diagnosis not present

## 2020-04-12 DIAGNOSIS — R2681 Unsteadiness on feet: Secondary | ICD-10-CM | POA: Diagnosis not present

## 2020-04-14 DIAGNOSIS — Z9181 History of falling: Secondary | ICD-10-CM | POA: Diagnosis not present

## 2020-04-14 DIAGNOSIS — M6281 Muscle weakness (generalized): Secondary | ICD-10-CM | POA: Diagnosis not present

## 2020-04-14 DIAGNOSIS — R2681 Unsteadiness on feet: Secondary | ICD-10-CM | POA: Diagnosis not present

## 2020-04-16 DIAGNOSIS — Z9181 History of falling: Secondary | ICD-10-CM | POA: Diagnosis not present

## 2020-04-16 DIAGNOSIS — M6281 Muscle weakness (generalized): Secondary | ICD-10-CM | POA: Diagnosis not present

## 2020-04-16 DIAGNOSIS — R2681 Unsteadiness on feet: Secondary | ICD-10-CM | POA: Diagnosis not present

## 2020-04-19 DIAGNOSIS — R2681 Unsteadiness on feet: Secondary | ICD-10-CM | POA: Diagnosis not present

## 2020-04-19 DIAGNOSIS — C44319 Basal cell carcinoma of skin of other parts of face: Secondary | ICD-10-CM | POA: Diagnosis not present

## 2020-04-19 DIAGNOSIS — Z9181 History of falling: Secondary | ICD-10-CM | POA: Diagnosis not present

## 2020-04-19 DIAGNOSIS — M6281 Muscle weakness (generalized): Secondary | ICD-10-CM | POA: Diagnosis not present

## 2020-04-19 DIAGNOSIS — Z85828 Personal history of other malignant neoplasm of skin: Secondary | ICD-10-CM | POA: Diagnosis not present

## 2020-04-21 DIAGNOSIS — M6281 Muscle weakness (generalized): Secondary | ICD-10-CM | POA: Diagnosis not present

## 2020-04-21 DIAGNOSIS — Z9181 History of falling: Secondary | ICD-10-CM | POA: Diagnosis not present

## 2020-04-21 DIAGNOSIS — R2681 Unsteadiness on feet: Secondary | ICD-10-CM | POA: Diagnosis not present

## 2020-04-26 DIAGNOSIS — M6281 Muscle weakness (generalized): Secondary | ICD-10-CM | POA: Diagnosis not present

## 2020-04-26 DIAGNOSIS — Z9181 History of falling: Secondary | ICD-10-CM | POA: Diagnosis not present

## 2020-04-26 DIAGNOSIS — R2681 Unsteadiness on feet: Secondary | ICD-10-CM | POA: Diagnosis not present

## 2020-04-28 DIAGNOSIS — M6281 Muscle weakness (generalized): Secondary | ICD-10-CM | POA: Diagnosis not present

## 2020-04-28 DIAGNOSIS — Z9181 History of falling: Secondary | ICD-10-CM | POA: Diagnosis not present

## 2020-04-28 DIAGNOSIS — R2681 Unsteadiness on feet: Secondary | ICD-10-CM | POA: Diagnosis not present

## 2020-05-03 DIAGNOSIS — M6281 Muscle weakness (generalized): Secondary | ICD-10-CM | POA: Diagnosis not present

## 2020-05-03 DIAGNOSIS — Z9181 History of falling: Secondary | ICD-10-CM | POA: Diagnosis not present

## 2020-05-04 ENCOUNTER — Encounter: Payer: Self-pay | Admitting: Internal Medicine

## 2020-05-04 ENCOUNTER — Other Ambulatory Visit: Payer: Self-pay

## 2020-05-04 ENCOUNTER — Ambulatory Visit: Payer: Medicare PPO | Admitting: Internal Medicine

## 2020-05-04 DIAGNOSIS — J449 Chronic obstructive pulmonary disease, unspecified: Secondary | ICD-10-CM

## 2020-05-04 DIAGNOSIS — R058 Other specified cough: Secondary | ICD-10-CM | POA: Diagnosis not present

## 2020-05-04 NOTE — Assessment & Plan Note (Addendum)
Onset around 2008 - Allergy profile 05/22/2016 >  Eos 0.3 /  IgE  10 neg rast - singulair / gerd rx started 05/22/2016 >>> still with pnds 06/19/2016 > add dymista / 1st gen H1> no better but never took h1 so rec stop dymista  - no better 08/07/2016 on singulair/ anoro > rec stop both and just use prn saba/ h1 > improved 09/26/2016  - restarted gerd rx and 1st gen H1 blockers per guidelines  12/03/2017  - rec d/c fosfamax x 6 weeks 01/14/2018 > resolved 02/25/2018 so ok to rechallenge with fosfamax 02/25/2018 and flared again at ov 04/30/2018 so rec substitute prolia or Reclast  - 02/06/2019 flared again on biphosphonates > d/c permanently, consider reclast or prolia  - 02/21/2019 added gabapentin 100 tid for globus and stop losartan >>>>  50% better 03/20/2019 so rec titrate up to 300 mg tid if tolerates (did not do) - Sinus CT 10/08/2019 >>>  Nl  - 12/04/2019 titrate up to 300 mg gabapentin to tid > only taking bid as of 01/29/2020 so rec bfast, supper, bedtime dosing and if not better   - 05/04/2020 see copd rx, if not improving plan is to refer to Bettina Gavia at [facility].         Each maintenance medication was reviewed in detail including emphasizing most importantly the difference between maintenance and prns and under what circumstances the prns are to be triggered using an action plan format where appropriate.  Total time for H and P, chart review, counseling,reviewing respimat device and generating customized AVS unique to this office visit / charting = 25 min

## 2020-05-04 NOTE — Progress Notes (Signed)
Subjective:   Patient ID: [name], female    DOB: 09-02-1940     MRN: [id number]     Brief patient profile:  80  yowf  Only smoked age 80 - 55 - 53  s obvious sequelae with new cough while on a cruise in September 2016 in [place] > cxr > ct chest > PET > referred to pulmonary clinic 02/15/2015 by Dr  Philis Fendt [facility] with GOLD I copd 02/19/15     History of Present Illness  02/15/2015 1st [facility] office visit/ [name]   Chief Complaint  Patient presents with  . PULMONARY CONSULT    Pulmonary nodules. Pt reports some dyspnea with exertion, some morning dry to wet cough. Pt does not use any inhalers.   Cough better but some cc am congestion x years ? Worse in winter > total ? Usually swallows so can't say color or amt assoc with mild pnds  Doe only with exertion = MMRC1 = can walk nl pace, flat grade, can't hurry or go uphills or steps s mild sob  rec Please remember to go to the lab  department downstairs for your tests - we will call you with the results when they are available. 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 Follow-up    Coughing more and having more PND. Cough is non prod. She is not using her albuterol inhaler.   after stirs has  lots of nonproductive coughing assoc with sense of pnds  no better on anoro,  Not limited by breathing from desired activities   rec Stop anoro and montelukast to see what difference this makes For drainage / throat tickle try take CHLORPHENIRAMINE  4 mg - take one every 4 hours as needed -  available over the counter-   - late add:  pred x 6 days prn flare x one - consider adding gabapentin next ov    09/26/2016  f/u ov/[name] re: GOLD I copd [name] / no worse breathing or coughing off anoro/montelukast but maint on nasacort  Chief Complaint  Patient presents with  . Follow-up    Breathing is doing well and she is not coughing much. No new co's.  doe = MMRC1 = can walk nl pace, flat grade, can't hurry or go uphills or steps s sob   Only a little drainage p one chlorpheniramine in am and double strength at hs and sleep fine now rec Continue protonix and pepcid for another 2 months then fine to try to taper  off by stopping the protonix first and changing the pepcid 20 mg twice daily then taper the pepcid off as well and see if cough flares or not - if does flare, restart and you may need GI evaluation  For drainage / throat tickle try take CHLORPHENIRAMINE  4 mg - take one every 4 hours as needed - available over the counter- may cause drowsiness so start with just a bedtime dose or two and see how  you tolerate it before trying in daytime   If not satisfied we start you on gabapentin 100 mg three times daily     10/08/17  Excisional bx LUL c/w nec granuloma      12/03/2017 acute extended ov/Jakai Risse re: re-establish  sob gradually downhill x one year then much worse since June 2019 Chief Complaint  Patient presents with  . Acute Visit    Increased cough since June- prod but unsure sputum color. She states her breathing has been gradually worse since her last visit May 2019.   still using chlorpheniramine  And still sleeping fine but wakes up feeling very congested at usual hour and then all during the day has same sensation but not coughing up excess/purulent mucus Doe gradually worse x one year  = MMRC2 = can't walk a nl pace on a flat grade s sob but does fine slow and flat  Arthritis good control on arava  rec For drainage / throat tickle try take CHLORPHENIRAMINE  4 mg - take  one every 4 hours as needed - available over the counter- may cause drowsiness so start with just a bedtime dose or two and see how you tolerate it before trying in daytime   Pantoprazole (protonix) 40 mg   Take  30-60 min before first meal of the day and Pepcid (famotidine)  20 mg one @  bedtime until return to office - this is the best way to tell whether stomach acid is contributing to your problem.   GERD diet  Pl   Add if cough/sob worse > Prednisone 10 mg take  4 each am x 2 days,   2 each am x 2 days,  1 each am x 2 days and stop       01/14/2018  f/u ov/[name] re:  GOLD I copd with reversible component . Did not take the prednisone  Chief Complaint  Patient presents with  . Follow-up    PFT's done today. Her cough and SOB are about the same. No new co's.   Dyspnea:  MMRC1 = can walk nl pace, flat grade, can't hurry or go uphills or steps s sob   Cough: more of throat clearing/ daytime x years / p  rx fosfamax >>  worse since then  Sleeping: flat bed / one pillow SABA use: none 02: none  rec Start BREO 568 one click each am x 2 drags each am  Try off fosfamax for now  Please schedule a follow up office visit in 6 weeks, call sooner if needed     02/25/2018  f/u ov/[name] re: COPD GOLD 1/ maint breo and no fosfamax since last ov  Chief Complaint  Patient presents with  . Follow-up    Breathing is unchanged. She does not have a rescue inhaler.   Dyspnea:   MMRC1 = can walk nl pace, flat grade, can't hurry or go uphills or steps s sob   Cough: no longer  Coughing/ not  Much throat clearing  Sleeping: bed is flat/ one pillow rec Ok to start back on fosfamax with no other changes for now to see what difference it make but if upper airway symptoms are worse you will need alternative (Recast/ prolia)  Remember to brush your teeth and tongue with [employer] based toothpaste and if not better we need to see you back for a different inhaler (you will need to bring your drug formulary  with you )     04/30/2018  f/u ov/[name] re:  Copd GOLD I/ maint breo/ back on fosfamax > more throat clearing despite rx for gerd Chief Complaint  Patient presents with  . Follow-up    Breathing is unchanged. No new co's. She does not have a rescue inhaler.   Dyspnea:  Still mmrc1  Cough: min throat clearing worse on fosfamax  Sleeping: bed is flat/ one or two pillows SABA use: none  02: none rec Try off fosfamax now  to see if throat clearing is better and if it is you Prolia or Reclast as options If not better after 3 months, bring your formulary with you and we'll try you on a different inhaler- try off the breo for a week prior to the visit you can   02/06/2019  f/u ov/[name] re:  Gradually downhill back on biphosphonates /Breo with only GOLD I criteria previously  Chief Complaint  Patient presents with  . Follow-up    Breathing is no better since last visit. She is getting SOB with exertion such as just standing up. She rarely uses her albuterol inhaler, does not feel it helps. She has non prod cough.   Dyspnea:  MMRC3 = can't walk 100 yards even at a slow pace at a flat grade s stopping due to sob   Cough: worse in fall and winter but day > noct with sensation of globus Sleeping: flat / one pillow no resp symptoms noct  SABA use: rarely, not helping  02: none  rec Pantoprazole (protonix) 40 mg   Take  30-60 min before first meal of the day and Pepcid (famotidine)  20 mg one after supper  until return to office - this is the best way to tell whether stomach acid is contributing to your problem and stop the risedronate for now  GERD diet  Prednisone 10 mg take  4 each am x 2 days,   2 each am x 2 days,  1 each am x 2 days and stop  Stop breo and start symbicort 80 Take 2 puffs first thing in am and then another 2 puffs about 12 hours later      02/21/2019  f/u ov/[name] re: GOLD I copd, worsening sob  Chief Complaint  Patient presents with  . Follow-up    Breathing is  unchanged. She rarely uses her albuterol inhaler.   Dyspnea:  Room to room but also limited by L hip pain  Cough: all day sensation of globus but no mucus x years  Sleeping: flat/ one pillow  SABA use: used once and no better  02: none rec Stop symbicort to see what difference if any it makes Gabapentin is 100 mg three times daily  Stop losartan and start metaprolol 25 mg twice daily          03/20/2019  f/u ov/[name] HA:LPFX I copd/  cough improved p last ov  X 2 weeks then pred started and ex tol improved also Chief Complaint  Patient presents with  . Follow-up    Breathing has improved since the last visit. She is on pred taper now per Dr Lenna Gilford.   Dyspnea:  Much better walking around friends homes x 10 min s stopping slow pace Cough: globus better but not gone  Sleeping: fine resp wise SABA use: none  02: none  rec Increase Lopressor 50 mg twice daily  Gabapentin 100 mg three times a day - call if throat discomfort is not improving to adjust up to maximum of 300 mg three  times a day    09/26/2019  f/u ov/[name] re: GOLD I copd/recurrent cough [name] off gabapentin  Chief Complaint  Patient presents with  . Follow-up    pt states sob all times.pt states not using rescue inhaler  Dyspnea:  One hall at a time  Then has to stop at [facility]  Cough: worse off gabapentin  Then ran out of h1 and flared / more yellow /productive in am/ convinced it's due to "drainage"  Sleeping: denies resp symptoms   Waking her  up  SABA use: doesn't help 02: no   rec Gabapentin 100 mg three times a day and if not satisfied with the cough and sensation of too much throat mucus  We will call to schedule a sinus CT Please remember to go to the  x-ray department  for your tests - we will call you with the results when they are available   12/04/2019  f/u ov/[name] re:  Copd I / uacs  Chief Complaint  Patient presents with  . Follow-up    Breathing is about the same. Cough has been worse- prod but  unsure of sputum color. She has not been using her albuterol inhaler.   Dyspnea:  One hallway and stops  Cough: sounds  juicier in am but nothing comes up  Sleeping: typically not waking her  SABA use: none 02: none  rec Stiolto  2 pffs first thing in am and you should  breathe better within a few days to a week and call for refill  Gabapentin 100 mg x 2 x three times a day for at least a week and then start gabapentin 300 three times a day    01/29/2020  f/u ov/[name] re: COPD I / uacs / not able to remember lunch time gabapentin Chief Complaint  Patient presents with  . Follow-up    Breathing is overall doing well. She states does have some "congestion and a frog in my throat". She has not had to use her albuterol inhaler since her last visit.   Dyspnea: more limited by legs than sob  Cough: frog in throat and dry mouth since stiolto 2 each am but thinks breathing better  Sleeping: bed is flat SABA use: none 02: none  rec Try to reduce stiolto to one puff each am to see if helps your dry mouth (think of like high octane fuel ok to adjust up down or off) Ok to spread out the gabapentin 300 mg three times a day If throat problems continue you will need to be referred to [facility] Dr Carol Ada     05/04/2020  f/u ov/[name] re:  COPD 1 / RA  With UACS  On gabapentin 300 tid better/ did not follow [facility] recs Chief Complaint  Patient presents with  . Follow-up    Breathing is unchanged since the last visit. She denies any new co's. Has not used her albuterol inhaler.   Dyspnea:  Walking hallways at [facility] about the same / both lower legs and sob same time  Cough: no change frog in throat and still on stiolto x 2  Sleeping: bed is flat / one pillow  SABA use: none  02: none    No obvious day to day or daytime variability or assoc excess/ purulent sputum or mucus plugs or hemoptysis or cp or chest tightness, subjective wheeze or overt sinus or hb symptoms.   Sleeping   without nocturnal  or early am exacerbation  of respiratory  c/o's or need for noct saba. Also denies any obvious fluctuation of symptoms with weather or environmental changes or other aggravating or alleviating factors except as outlined above   No unusual exposure hx or h/o childhood pna/ asthma or knowledge of premature birth.  Current Allergies, Complete Past Medical History, Past Surgical History, Family History, and Social History were reviewed in [employer] record.  ROS  The following are not active complaints unless bolded Hoarseness, sore throat, dysphagia, dental problems, itching, sneezing,  nasal congestion or discharge of excess mucus or purulent secretions, ear ache,   fever, chills, sweats, unintended wt loss or wt gain, classically pleuritic or exertional cp,  orthopnea pnd or arm/hand swelling  or leg swelling, presyncope, palpitations, abdominal pain, anorexia, nausea, vomiting, diarrhea  or change in bowel habits or change in bladder habits, change in stools or change in urine, dysuria, hematuria,  rash, arthralgias, visual complaints, headache, numbness, weakness or ataxia or problems with walking or coordination,  change in mood or  memory.        Current Meds  Medication Sig  . acetaminophen (TYLENOL) 500 MG tablet Take 500 mg by mouth every 6 (six) hours as needed (pain).   [name] albuterol (VENTOLIN HFA) 108 (90 Base) MCG/ACT inhaler Inhale 2 puffs into the lungs every 6 (six) hours as needed for wheezing or shortness of breath.  [name] amLODipine (NORVASC) 2.5 MG tablet Take 1 tablet (2.5 mg total) by mouth daily.  [name] atorvastatin (LIPITOR) 80 MG tablet TAKE 1 TABLET  DAILY  . Chlorpheniramine Maleate (CHLORPHEN PO) Take by mouth.  . Cholecalciferol (VITAMIN D3) 50 MCG (2000 UT) capsule Take 1 capsule (2,000 Units total) by mouth daily.  . clopidogrel (PLAVIX) 75 MG tablet Take 1 tablet (75 mg total) by mouth daily.  . Cyanocobalamin (B-12) 2500 MCG TABS one  po qd  . docusate sodium (COLACE) 100 MG capsule Take 100 mg by mouth 2 (two) times daily.  . famotidine (PEPCID) 20 MG tablet TAKE 1 TABLET BY MOUTH AT BEDTIME  . gabapentin (NEURONTIN) 300 MG capsule TAKE 1 CAPSULE BY MOUTH THREE TIMES A DAY  . hydrochlorothiazide (HYDRODIURIL) 12.5 MG tablet TAKE 1 TABLET BY MOUTH EVERY DAY  . leflunomide (ARAVA) 20 MG tablet Take 20 mg by mouth daily.   [name] levothyroxine (SYNTHROID) 88 MCG tablet Take 1 tablet (88 mcg total) by mouth daily before breakfast.  . loperamide (IMODIUM A-D) 2 MG tablet Take 2 mg by mouth 4 (four) times daily as needed for diarrhea or loose stools.  [name] losartan (COZAAR) 100 MG tablet Take 0.5 tablets (50 mg total) by mouth daily.  . metoprolol tartrate (LOPRESSOR) 50 MG tablet Take 1.5 tablets (75 mg total) by mouth 2 (two) times daily.  . pantoprazole (PROTONIX) 40 MG tablet Take 1 tablet (40 mg total) by mouth daily. Take 30-60 min before first meal of the day  . psyllium (METAMUCIL) 58.6 % packet Take 1 packet by mouth daily.  . Tiotropium Bromide-Olodaterol (STIOLTO RESPIMAT) 2.5-2.5 MCG/ACT AERS Inhale 2 puffs into the lungs daily.  [name] triamcinolone (NASACORT) 55 MCG/ACT AERO nasal inhaler Place 1 spray into the nose daily.                    Objective:   Physical Exam     05/04/2020          153  01/29/2020         150  12/04/2019  148 09/26/2019         146  03/20/2019       151  02/21/2019       148 02/06/2019         148 04/30/2018      148 01/14/2018        144  12/03/2017          147  09/26/2016        141  08/07/2016          146  06/19/2016        152  05/22/2016        148   02/15/15 155 lb 6.4 oz (70.489 kg)  01/29/15 154 lb (69.854 kg)  11/26/14 153 lb 8 oz (69.627 kg)      Vital signs reviewed  05/04/2020  - Note at rest 02 sats  94% on RA   General appearance:    Elderly wf nad    HEENT : pt wearing mask not removed for exam due to covid - 19 concerns.   NECK :  without JVD/Nodes/TM/ nl carotid  upstrokes bilaterally   LUNGS: no acc muscle use,  Min barrel  contour chest wall with bilateral  slightly decreased bs s audible wheeze and  without cough on insp or exp maneuvers and min  Hyperresonant  to  percussion bilaterally     CV:  RRR  no s3 or murmur or increase in P2, and no edema   ABD:  soft and nontender with pos end  insp Hoover's  in the supine position. No bruits or organomegaly appreciated, bowel sounds nl  MS:   Nl gait/  ext warm without deformities, calf tenderness, cyanosis or clubbing No obvious joint restrictions   SKIN: warm and dry without lesions    NEURO:  alert, approp, nl sensorium with  no motor or cerebellar deficits apparent.               Assessment & Plan:

## 2020-05-04 NOTE — Assessment & Plan Note (Signed)
Quit smoking in her 85s - PFT's  02/19/15   FEV1 1.78 (90 % ) ratio 66  p 12 % improvement from saba p nothing  prior to study with DLCO  71 % corrects to 78 % for alv volume  And classical curvature  - Spirometry 06/19/2016  FEV1 1.65  (84%)  Ratio 69  With no prior rx  - PFT's  01/14/2018  FEV1 1.68 (90 % ) ratio 68  p 18 % improvement from saba p nothing  prior to study with DLCO  84 % corrects to 90  % for alv volume  - 01/14/2018    try BREO 100 each am > worse 02/06/2019 so d/c'd - 02/06/2019  After extensive coaching inhaler device,  effectiveness =   75% with hfa > change to symb 80 2bid and add spacer next ov if uacs component not improved  - 02/06/2019   Walked RA x one lap =  approx 250 ft - stopped due to  Sob with sats 95% avg pace  - 02/21/2019   Walked RA  2 laps @  approx 214ft each @ slow pace  stopped due to tired more than sob / sats 90%   - PFT's  09/18/19   FEV1 1.83 (101 % ) ratio 0.71  p 15 % improvement from saba p nothing prior to study with DLCO  17.91 (98%) corrects to 3.74 (91 %)  for alv volume and FV curve mild concavity on exp portion   09/26/2019   Walked RA 3 laps @ approx 241ft each @  Slow pace  stopped due sob several times  with sats 97% or higher - 12/04/2019  After extensive coaching inhaler device,  effectiveness =    75% with smi   - 05/04/2020 try stiolto one each am for throat discomfort despite 300 tid of gabapentin and if not better > ENT eval by Bettina Gavia   Pt is Group B in terms of symptom/risk and laba/lama therefore appropriate rx at this point >>>  stiolto best bet, probably will do just as well with the one puff with less throat irritation

## 2020-05-04 NOTE — Patient Instructions (Signed)
Try to reduce stiolto to one puff each am to see if helps your dry mouth (think of like high octane fuel ok to adjust up down or off)  Continue  gabapentin 300 mg three times a day   Please schedule a follow up visit in 6 months but call sooner if needed

## 2020-05-05 DIAGNOSIS — Z9181 History of falling: Secondary | ICD-10-CM | POA: Diagnosis not present

## 2020-05-05 DIAGNOSIS — M6281 Muscle weakness (generalized): Secondary | ICD-10-CM | POA: Diagnosis not present

## 2020-05-06 DIAGNOSIS — D649 Anemia, unspecified: Secondary | ICD-10-CM | POA: Diagnosis not present

## 2020-05-07 DIAGNOSIS — M6281 Muscle weakness (generalized): Secondary | ICD-10-CM | POA: Diagnosis not present

## 2020-05-07 DIAGNOSIS — Z9181 History of falling: Secondary | ICD-10-CM | POA: Diagnosis not present

## 2020-05-10 DIAGNOSIS — Z9181 History of falling: Secondary | ICD-10-CM | POA: Diagnosis not present

## 2020-05-10 DIAGNOSIS — M6281 Muscle weakness (generalized): Secondary | ICD-10-CM | POA: Diagnosis not present

## 2020-05-12 DIAGNOSIS — Z9181 History of falling: Secondary | ICD-10-CM | POA: Diagnosis not present

## 2020-05-12 DIAGNOSIS — M6281 Muscle weakness (generalized): Secondary | ICD-10-CM | POA: Diagnosis not present

## 2020-05-14 DIAGNOSIS — M6281 Muscle weakness (generalized): Secondary | ICD-10-CM | POA: Diagnosis not present

## 2020-05-14 DIAGNOSIS — Z9181 History of falling: Secondary | ICD-10-CM | POA: Diagnosis not present

## 2020-05-17 ENCOUNTER — Other Ambulatory Visit: Payer: Self-pay | Admitting: Internal Medicine

## 2020-05-17 DIAGNOSIS — R058 Other specified cough: Secondary | ICD-10-CM

## 2020-05-19 DIAGNOSIS — M6281 Muscle weakness (generalized): Secondary | ICD-10-CM | POA: Diagnosis not present

## 2020-05-19 DIAGNOSIS — Z9181 History of falling: Secondary | ICD-10-CM | POA: Diagnosis not present

## 2020-05-21 DIAGNOSIS — Z9181 History of falling: Secondary | ICD-10-CM | POA: Diagnosis not present

## 2020-05-21 DIAGNOSIS — M6281 Muscle weakness (generalized): Secondary | ICD-10-CM | POA: Diagnosis not present

## 2020-05-24 DIAGNOSIS — Z9181 History of falling: Secondary | ICD-10-CM | POA: Diagnosis not present

## 2020-05-24 DIAGNOSIS — M6281 Muscle weakness (generalized): Secondary | ICD-10-CM | POA: Diagnosis not present

## 2020-05-26 DIAGNOSIS — M6281 Muscle weakness (generalized): Secondary | ICD-10-CM | POA: Diagnosis not present

## 2020-05-26 DIAGNOSIS — Z9181 History of falling: Secondary | ICD-10-CM | POA: Diagnosis not present

## 2020-05-28 DIAGNOSIS — Z9181 History of falling: Secondary | ICD-10-CM | POA: Diagnosis not present

## 2020-05-28 DIAGNOSIS — M6281 Muscle weakness (generalized): Secondary | ICD-10-CM | POA: Diagnosis not present

## 2020-05-31 DIAGNOSIS — M150 Primary generalized (osteo)arthritis: Secondary | ICD-10-CM | POA: Diagnosis not present

## 2020-05-31 DIAGNOSIS — Z79899 Other long term (current) drug therapy: Secondary | ICD-10-CM | POA: Diagnosis not present

## 2020-05-31 DIAGNOSIS — E663 Overweight: Secondary | ICD-10-CM | POA: Diagnosis not present

## 2020-05-31 DIAGNOSIS — M0579 Rheumatoid arthritis with rheumatoid factor of multiple sites without organ or systems involvement: Secondary | ICD-10-CM | POA: Diagnosis not present

## 2020-05-31 DIAGNOSIS — M545 Low back pain, unspecified: Secondary | ICD-10-CM | POA: Diagnosis not present

## 2020-05-31 DIAGNOSIS — M2550 Pain in unspecified joint: Secondary | ICD-10-CM | POA: Diagnosis not present

## 2020-05-31 DIAGNOSIS — Z6827 Body mass index (BMI) 27.0-27.9, adult: Secondary | ICD-10-CM | POA: Diagnosis not present

## 2020-06-02 DIAGNOSIS — D649 Anemia, unspecified: Secondary | ICD-10-CM | POA: Diagnosis not present

## 2020-06-02 DIAGNOSIS — M0579 Rheumatoid arthritis with rheumatoid factor of multiple sites without organ or systems involvement: Secondary | ICD-10-CM | POA: Diagnosis not present

## 2020-06-07 ENCOUNTER — Other Ambulatory Visit: Payer: Self-pay

## 2020-06-08 ENCOUNTER — Encounter: Payer: Self-pay | Admitting: Internal Medicine

## 2020-06-08 ENCOUNTER — Ambulatory Visit: Payer: Medicare PPO | Admitting: Internal Medicine

## 2020-06-08 DIAGNOSIS — I10 Essential (primary) hypertension: Secondary | ICD-10-CM

## 2020-06-08 DIAGNOSIS — Z951 Presence of aortocoronary bypass graft: Secondary | ICD-10-CM | POA: Diagnosis not present

## 2020-06-08 DIAGNOSIS — N183 Chronic kidney disease, stage 3 unspecified: Secondary | ICD-10-CM

## 2020-06-08 DIAGNOSIS — M069 Rheumatoid arthritis, unspecified: Secondary | ICD-10-CM | POA: Diagnosis not present

## 2020-06-08 DIAGNOSIS — E034 Atrophy of thyroid (acquired): Secondary | ICD-10-CM

## 2020-06-08 DIAGNOSIS — R269 Unspecified abnormalities of gait and mobility: Secondary | ICD-10-CM | POA: Diagnosis not present

## 2020-06-08 NOTE — Assessment & Plan Note (Signed)
Seeing  Dr Trudie Reed  Off Prednisone Off MTX - side effects On Arava

## 2020-06-08 NOTE — Progress Notes (Signed)
Subjective:  Patient ID: [name], female    DOB: Nov 06, 1940  Age: 80 y.o. MRN: [id number]  CC: Follow-up (3 MONTH F/U)   HPI ALYN RIEDINGER presents for HTN, CAD, RA C/o DOE w/a mask on  Outpatient Medications Prior to Visit  Medication Sig Dispense Refill  . acetaminophen (TYLENOL) 500 MG tablet Take 500 mg by mouth every 6 (six) hours as needed (pain).     [name] albuterol (VENTOLIN HFA) 108 (90 Base) MCG/ACT inhaler Inhale 2 puffs into the lungs every 6 (six) hours as needed for wheezing or shortness of breath. 8 g 2  . amLODipine (NORVASC) 2.5 MG tablet Take 1 tablet (2.5 mg total) by mouth daily. 90 tablet 3  . atorvastatin (LIPITOR) 80 MG tablet TAKE 1 TABLET  DAILY 90 tablet 2  . Chlorpheniramine Maleate (CHLORPHEN PO) Take by mouth.    . Cholecalciferol (VITAMIN D3) 50 MCG (2000 UT) capsule Take 1 capsule (2,000 Units total) by mouth daily. 100 capsule 3  . clopidogrel (PLAVIX) 75 MG tablet TAKE 1 TABLET EVERY DAY 90 tablet 3  . Cyanocobalamin (B-12) 2500 MCG TABS one po qd 100 tablet 3  . docusate sodium (COLACE) 100 MG capsule Take 100 mg by mouth 2 (two) times daily.    . famotidine (PEPCID) 20 MG tablet TAKE 1 TABLET BY MOUTH AT BEDTIME 90 tablet 2  . gabapentin (NEURONTIN) 300 MG capsule TAKE 1 CAPSULE BY MOUTH THREE TIMES A DAY 90 capsule 2  . hydrochlorothiazide (HYDRODIURIL) 12.5 MG tablet TAKE 1 TABLET BY MOUTH EVERY DAY 90 tablet 3  . leflunomide (ARAVA) 20 MG tablet Take 20 mg by mouth daily.     [name] levothyroxine (SYNTHROID) 88 MCG tablet Take 1 tablet (88 mcg total) by mouth daily before breakfast. 90 tablet 3  . loperamide (IMODIUM A-D) 2 MG tablet Take 2 mg by mouth 4 (four) times daily as needed for diarrhea or loose stools.    [name] losartan (COZAAR) 100 MG tablet Take 0.5 tablets (50 mg total) by mouth daily. 90 tablet 3  . metoprolol tartrate (LOPRESSOR) 50 MG tablet Take 1.5 tablets (75 mg total) by mouth 2 (two) times daily. 270 tablet 3  . pantoprazole  (PROTONIX) 40 MG tablet TAKE 1 TABLET DAILY. TAKE 30 TO 60 MINUTES BEFORE FIRST MEAL OF THE DAY 90 tablet 3  . psyllium (METAMUCIL) 58.6 % packet Take 1 packet by mouth daily.    . Tiotropium Bromide-Olodaterol (STIOLTO RESPIMAT) 2.5-2.5 MCG/ACT AERS Inhale 2 puffs into the lungs daily. 4 g 11  . triamcinolone (NASACORT) 55 MCG/ACT AERO nasal inhaler Place 1 spray into the nose daily.      No facility-administered medications prior to visit.    ROS: Review of Systems  Constitutional: Negative for activity change, appetite change, chills, fatigue and unexpected weight change.  HENT: Negative for congestion, mouth sores and sinus pressure.   Eyes: Negative for visual disturbance.  Respiratory: Negative for cough and chest tightness.   Cardiovascular: Negative for chest pain and leg swelling.  Gastrointestinal: Negative for abdominal pain and nausea.  Genitourinary: Negative for difficulty urinating, frequency and vaginal pain.  Musculoskeletal: Positive for arthralgias and gait problem. Negative for back pain.  Skin: Negative for pallor and rash.  Neurological: Negative for dizziness, tremors, weakness, numbness and headaches.  Psychiatric/Behavioral: Negative for confusion and sleep disturbance.    Objective:  BP 132/82 (BP Location: Left Arm)   Pulse 88   Temp 97.8 F (36.6 C) (Oral)  Ht 5\' 3"  (1.6 m)   Wt 152 lb (68.9 kg)   SpO2 94%   BMI 26.93 kg/m   BP Readings from Last 3 Encounters:  06/08/20 132/82  05/04/20 (!) 144/76  03/18/20 (!) 106/47    Wt Readings from Last 3 Encounters:  06/08/20 152 lb (68.9 kg)  05/04/20 153 lb 12.8 oz (69.8 kg)  03/18/20 146 lb (66.2 kg)    Physical Exam Constitutional:      General: She is not in acute distress.    Appearance: She is well-developed.  HENT:     Head: Normocephalic.     Right Ear: External ear normal.     Left Ear: External ear normal.     Nose: Nose normal.     Mouth/Throat:     Mouth: Oropharynx is clear and  moist.  Eyes:     General:        Right eye: No discharge.        Left eye: No discharge.     Conjunctiva/sclera: Conjunctivae normal.     Pupils: Pupils are equal, round, and reactive to light.  Neck:     Thyroid: No thyromegaly.     Vascular: No JVD.     Trachea: No tracheal deviation.  Cardiovascular:     Rate and Rhythm: Normal rate and regular rhythm.     Heart sounds: Normal heart sounds.  Pulmonary:     Effort: No respiratory distress.     Breath sounds: No stridor. No wheezing.  Abdominal:     General: Bowel sounds are normal. There is no distension.     Palpations: Abdomen is soft. There is no mass.     Tenderness: There is no abdominal tenderness. There is no guarding or rebound.  Musculoskeletal:        General: Tenderness present. No edema.     Cervical back: Normal range of motion and neck supple.  Lymphadenopathy:     Cervical: No cervical adenopathy.  Skin:    Findings: No erythema or rash.  Neurological:     Mental Status: She is oriented to person, place, and time.     Cranial Nerves: No cranial nerve deficit.     Motor: No abnormal muscle tone.     Coordination: Coordination abnormal.     Deep Tendon Reflexes: Reflexes normal.  Psychiatric:        Mood and Affect: Mood and affect normal.        Behavior: Behavior normal.        Thought Content: Thought content normal.        Judgment: Judgment normal.   using a cane  Lab Results  Component Value Date   WBC 7.1 02/15/2020   HGB 12.2 02/15/2020   HCT 36.0 02/15/2020   PLT 292 02/15/2020   GLUCOSE 110 (H) 02/15/2020   CHOL 152 11/13/2018   TRIG 57.0 11/13/2018   HDL 72.50 11/13/2018   LDLDIRECT 162.1 10/28/2009   LDLCALC 68 11/13/2018   ALT 15 12/26/2019   AST 21 12/26/2019   NA 130 (L) 02/15/2020   K 4.4 02/15/2020   CL 98 02/15/2020   CREATININE 1.70 (H) 02/15/2020   BUN 41 (H) 02/15/2020   CO2 19 (L) 02/15/2020   TSH 2.32 09/16/2019   INR 0.89 05/20/2018    CT Chest Wo  Contrast  Result Date: 03/18/2020 CLINICAL DATA:  Pulmonary nodule. EXAM: CT CHEST WITHOUT CONTRAST TECHNIQUE: Multidetector CT imaging of the chest was performed following the standard protocol  without IV contrast. COMPARISON:  Chest CT 07/03/2019 FINDINGS: Cardiovascular: The heart size is normal. No substantial pericardial effusion. Coronary artery calcification is evident. Atherosclerotic calcification is noted in the wall of the thoracic aorta. Status post CABG. Mediastinum/Nodes: No mediastinal lymphadenopathy. No evidence for gross hilar lymphadenopathy although assessment is limited by the lack of intravenous contrast on today's study. The esophagus has normal imaging features. Interval progression of ill-defined upper normal left axillary nodes measuring up to 8 mm short axis. Likely reactive. Correlation for recent left upper extremity vaccination recommended. Lungs/Pleura: Trace pleuroparenchymal scarring noted right apex. Surgical scarring noted medial left upper lobe from prior wedge resection. The linear nodular opacity adjacent to the staple line measured previously at 7 mm is stable at 7 mm today (71/8). Air cyst in the left base measured previously at 1.3 x 1.3 cm is stable at 1.3 x 1.3 cm today with a thin smooth peripheral wall. Subpleural nodule identified as new on the previous study is stable measuring 7 x 4 mm today (106/8) compared to 8 x 5 mm previously. No new suspicious nodule or mass. No focal airspace consolidation. No pleural effusion. 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 Musculoskeletal: No worrisome lytic or sclerotic osseous abnormality. IMPRESSION: 1. The subpleural peripheral right lower lobe nodule identified as new on the previous study and measured previously at 7 mm is stable since that exam. Consider repeat CT in 12 months to ensure continued stability. Stable air cyst left lower lobe with thin smooth peripheral wall,  measuring 1.3 cm today. 2. Remaining pulmonary nodules are stable. No new pulmonary nodule or mass on today's study. 3. Interval progression of ill-defined upper normal left axillary nodes measuring up to 8 mm short axis. Not enlarged by CT criteria and likely reactive. Correlation for recent left upper extremity vaccination may prove helpful. 4. Aortic Atherosclerosis (ICD10-I70.0). Electronically Signed   By: Misty Stanley M.D.   On: 03/18/2020 14:50    Assessment & Plan:    Follow-up: No follow-ups on file.  [name], MD

## 2020-06-08 NOTE — Assessment & Plan Note (Signed)
Cont w/Losartan, Norvasc, HCTZ

## 2020-06-08 NOTE — Assessment & Plan Note (Signed)
On Levothroid 

## 2020-06-08 NOTE — Assessment & Plan Note (Signed)
Hydrate well BMET 

## 2020-06-08 NOTE — Assessment & Plan Note (Signed)
Cont with Plavix, Losartan, Metoprolol

## 2020-06-08 NOTE — Assessment & Plan Note (Signed)
Multifactorial Cane, walker

## 2020-06-15 DIAGNOSIS — E785 Hyperlipidemia, unspecified: Secondary | ICD-10-CM | POA: Diagnosis not present

## 2020-06-16 LAB — LIPID PANEL
Chol/HDL Ratio: 2.5 ratio (ref 0.0–4.4)
Cholesterol, Total: 168 mg/dL (ref 100–199)
HDL: 67 mg/dL (ref >39)
LDL Chol Calc (NIH): 78 mg/dL (ref 0–99)
Triglycerides: 135 mg/dL (ref 0–149)
VLDL Cholesterol Cal: 23 mg/dL (ref 5–40)

## 2020-06-21 MED ORDER — LOSARTAN POTASSIUM 50 MG PO TABS: 50.0000 mg | ORAL_TABLET | Freq: Every day | ORAL | 3 refills | Status: DC

## 2020-07-03 ENCOUNTER — Other Ambulatory Visit: Payer: Self-pay | Admitting: Internal Medicine

## 2020-07-03 DIAGNOSIS — R058 Other specified cough: Secondary | ICD-10-CM

## 2020-08-04 DIAGNOSIS — M0579 Rheumatoid arthritis with rheumatoid factor of multiple sites without organ or systems involvement: Secondary | ICD-10-CM | POA: Diagnosis not present

## 2020-08-04 DIAGNOSIS — R5383 Other fatigue: Secondary | ICD-10-CM | POA: Diagnosis not present

## 2020-08-14 MED ORDER — GABAPENTIN 300 MG PO CAPS: ORAL_CAPSULE | ORAL | 2 refills | Status: DC

## 2020-08-16 DIAGNOSIS — H4322 Crystalline deposits in vitreous body, left eye: Secondary | ICD-10-CM | POA: Diagnosis not present

## 2020-08-16 DIAGNOSIS — Z961 Presence of intraocular lens: Secondary | ICD-10-CM | POA: Diagnosis not present

## 2020-08-16 DIAGNOSIS — H35372 Puckering of macula, left eye: Secondary | ICD-10-CM | POA: Diagnosis not present

## 2020-08-16 DIAGNOSIS — H353122 Nonexudative age-related macular degeneration, left eye, intermediate dry stage: Secondary | ICD-10-CM | POA: Diagnosis not present

## 2020-08-25 ENCOUNTER — Other Ambulatory Visit: Payer: Self-pay | Admitting: Internal Medicine

## 2020-08-30 DIAGNOSIS — M810 Age-related osteoporosis without current pathological fracture: Secondary | ICD-10-CM | POA: Diagnosis not present

## 2020-09-07 ENCOUNTER — Ambulatory Visit: Payer: Medicare PPO | Admitting: Internal Medicine

## 2020-09-07 ENCOUNTER — Other Ambulatory Visit: Payer: Self-pay

## 2020-09-07 ENCOUNTER — Encounter: Payer: Self-pay | Admitting: Internal Medicine

## 2020-09-07 DIAGNOSIS — D510 Vitamin B12 deficiency anemia due to intrinsic factor deficiency: Secondary | ICD-10-CM

## 2020-09-07 DIAGNOSIS — N2889 Other specified disorders of kidney and ureter: Secondary | ICD-10-CM

## 2020-09-07 DIAGNOSIS — E034 Atrophy of thyroid (acquired): Secondary | ICD-10-CM | POA: Diagnosis not present

## 2020-09-07 DIAGNOSIS — Z951 Presence of aortocoronary bypass graft: Secondary | ICD-10-CM

## 2020-09-07 DIAGNOSIS — I10 Essential (primary) hypertension: Secondary | ICD-10-CM | POA: Diagnosis not present

## 2020-09-07 DIAGNOSIS — H6123 Impacted cerumen, bilateral: Secondary | ICD-10-CM

## 2020-09-07 DIAGNOSIS — N183 Chronic kidney disease, stage 3 unspecified: Secondary | ICD-10-CM | POA: Diagnosis not present

## 2020-09-07 DIAGNOSIS — I700 Atherosclerosis of aorta: Secondary | ICD-10-CM

## 2020-09-07 DIAGNOSIS — M069 Rheumatoid arthritis, unspecified: Secondary | ICD-10-CM | POA: Diagnosis not present

## 2020-09-07 DIAGNOSIS — E785 Hyperlipidemia, unspecified: Secondary | ICD-10-CM

## 2020-09-07 LAB — CBC WITH DIFFERENTIAL/PLATELET
Basophils Absolute: 0.2 10*3/uL — ABNORMAL HIGH (ref 0.0–0.1)
Basophils Relative: 3.2 % — ABNORMAL HIGH (ref 0.0–3.0)
Eosinophils Absolute: 0.6 10*3/uL (ref 0.0–0.7)
Eosinophils Relative: 7.8 % — ABNORMAL HIGH (ref 0.0–5.0)
HCT: 36.7 % (ref 36.0–46.0)
Hemoglobin: 12.1 g/dL (ref 12.0–15.0)
Lymphocytes Relative: 29.5 % (ref 12.0–46.0)
Lymphs Abs: 2.2 10*3/uL (ref 0.7–4.0)
MCHC: 33 g/dL (ref 30.0–36.0)
MCV: 95.3 fl (ref 78.0–100.0)
Monocytes Absolute: 1.5 10*3/uL — ABNORMAL HIGH (ref 0.1–1.0)
Monocytes Relative: 20.6 % — ABNORMAL HIGH (ref 3.0–12.0)
Neutro Abs: 2.9 10*3/uL (ref 1.4–7.7)
Neutrophils Relative %: 38.9 % — ABNORMAL LOW (ref 43.0–77.0)
Platelets: 314 10*3/uL (ref 150.0–400.0)
RBC: 3.85 Mil/uL — ABNORMAL LOW (ref 3.87–5.11)
RDW: 15.2 % (ref 11.5–15.5)
WBC: 7.5 10*3/uL (ref 4.0–10.5)

## 2020-09-07 LAB — LIPID PANEL
Cholesterol: 157 mg/dL (ref 0–200)
HDL: 59.3 mg/dL (ref >39.00)
LDL Cholesterol: 74 mg/dL (ref 0–99)
NonHDL: 97.99
Total CHOL/HDL Ratio: 3
Triglycerides: 121 mg/dL (ref 0.0–149.0)
VLDL: 24.2 mg/dL (ref 0.0–40.0)

## 2020-09-07 LAB — COMPREHENSIVE METABOLIC PANEL
ALT: 20 U/L (ref 0–35)
AST: 23 U/L (ref 0–37)
Albumin: 4 g/dL (ref 3.5–5.2)
Alkaline Phosphatase: 64 U/L (ref 39–117)
BUN: 58 mg/dL — ABNORMAL HIGH (ref 6–23)
CO2: 28 mEq/L (ref 19–32)
Calcium: 10 mg/dL (ref 8.4–10.5)
Chloride: 100 mEq/L (ref 96–112)
Creatinine, Ser: 2.07 mg/dL — ABNORMAL HIGH (ref 0.40–1.20)
GFR: 21.96 mL/min — ABNORMAL LOW (ref >60.00)
Glucose, Bld: 81 mg/dL (ref 70–99)
Potassium: 5.2 mEq/L — ABNORMAL HIGH (ref 3.5–5.1)
Sodium: 137 mEq/L (ref 135–145)
Total Bilirubin: 0.6 mg/dL (ref 0.2–1.2)
Total Protein: 6.7 g/dL (ref 6.0–8.3)

## 2020-09-07 LAB — TSH: TSH: 2.82 u[IU]/mL (ref 0.35–4.50)

## 2020-09-07 NOTE — Assessment & Plan Note (Addendum)
On atorvastatin

## 2020-09-07 NOTE — Progress Notes (Signed)
Subjective:  Patient ID: [name], female    DOB: [birth year]  Age: 80 y.o. MRN: [id number]  CC: Follow-up (3 month f/u)   HPI [name] presents for LBP, RA, CAD, HTN f/u C/o ear wax  Outpatient Medications Prior to Visit  Medication Sig Dispense Refill  . acetaminophen (TYLENOL) 500 MG tablet Take 500 mg by mouth every 6 (six) hours as needed (pain).     [name] albuterol (VENTOLIN HFA) 108 (90 Base) MCG/ACT inhaler Inhale 2 puffs into the lungs every 6 (six) hours as needed for wheezing or shortness of breath. 8 g 2  . amLODipine (NORVASC) 2.5 MG tablet Take 1 tablet (2.5 mg total) by mouth daily. 90 tablet 3  . atorvastatin (LIPITOR) 80 MG tablet TAKE 1 TABLET EVERY DAY 90 tablet 2  . Chlorpheniramine Maleate (CHLORPHEN PO) Take by mouth.    . Cholecalciferol (VITAMIN D3) 50 MCG (2000 UT) capsule Take 1 capsule (2,000 Units total) by mouth daily. 100 capsule 3  . clopidogrel (PLAVIX) 75 MG tablet TAKE 1 TABLET EVERY DAY 90 tablet 3  . Cyanocobalamin (B-12) 2500 MCG TABS one po qd 100 tablet 3  . docusate sodium (COLACE) 100 MG capsule Take 100 mg by mouth 2 (two) times daily.    . famotidine (PEPCID) 20 MG tablet TAKE 1 TABLET AT BEDTIME 90 tablet 3  . gabapentin (NEURONTIN) 300 MG capsule TAKE 1 CAPSULE BY MOUTH THREE TIMES A DAY 90 capsule 2  . hydrochlorothiazide (HYDRODIURIL) 12.5 MG tablet TAKE 1 TABLET BY MOUTH EVERY DAY 90 tablet 3  . leflunomide (ARAVA) 20 MG tablet Take 20 mg by mouth daily.     [name] levothyroxine (SYNTHROID) 88 MCG tablet Take 1 tablet (88 mcg total) by mouth daily before breakfast. 90 tablet 3  . loperamide (IMODIUM A-D) 2 MG tablet Take 2 mg by mouth 4 (four) times daily as needed for diarrhea or loose stools.    [name] losartan (COZAAR) 50 MG tablet Take 1 tablet (50 mg total) by mouth daily. 90 tablet 3  . metoprolol tartrate (LOPRESSOR) 50 MG tablet Take 1.5 tablets (75 mg total) by mouth 2 (two) times daily. 270 tablet 3  . pantoprazole (PROTONIX) 40 MG  tablet TAKE 1 TABLET DAILY. TAKE 30 TO 60 MINUTES BEFORE FIRST MEAL OF THE DAY 90 tablet 3  . psyllium (METAMUCIL) 58.6 % packet Take 1 packet by mouth daily.    . Tiotropium Bromide-Olodaterol (STIOLTO RESPIMAT) 2.5-2.5 MCG/ACT AERS Inhale 2 puffs into the lungs daily. 4 g 11  . triamcinolone (NASACORT) 55 MCG/ACT AERO nasal inhaler Place 1 spray into the nose daily.      No facility-administered medications prior to visit.    ROS: Review of Systems  Constitutional: Positive for fatigue. Negative for activity change, appetite change, chills and unexpected weight change.  HENT: Negative for congestion, mouth sores and sinus pressure.   Eyes: Negative for visual disturbance.  Respiratory: Negative for cough and chest tightness.   Gastrointestinal: Negative for abdominal pain and nausea.  Genitourinary: Negative for difficulty urinating, frequency and vaginal pain.  Musculoskeletal: Positive for arthralgias, back pain and gait problem.  Skin: Negative for pallor and rash.  Neurological: Negative for dizziness, tremors, weakness, numbness and headaches.  Psychiatric/Behavioral: Negative for confusion and sleep disturbance.    Objective:  BP (!) 142/62 (BP Location: Left Arm)   Pulse 72   Temp 98.3 F (36.8 C) (Oral)   Ht 5\' 3"  (1.6 m)   Wt 153  lb 9.6 oz (69.7 kg)   SpO2 95%   BMI 27.21 kg/m   BP Readings from Last 3 Encounters:  09/07/20 (!) 142/62  06/08/20 132/82  05/04/20 (!) 144/76    Wt Readings from Last 3 Encounters:  09/07/20 153 lb 9.6 oz (69.7 kg)  06/08/20 152 lb (68.9 kg)  05/04/20 153 lb 12.8 oz (69.8 kg)    Physical Exam Constitutional:      General: She is not in acute distress.    Appearance: She is well-developed.  HENT:     Head: Normocephalic.     Right Ear: External ear normal.     Left Ear: External ear normal.     Nose: Nose normal.  Eyes:     General:        Right eye: No discharge.        Left eye: No discharge.     Conjunctiva/sclera:  Conjunctivae normal.     Pupils: Pupils are equal, round, and reactive to light.  Neck:     Thyroid: No thyromegaly.     Vascular: No JVD.     Trachea: No tracheal deviation.  Cardiovascular:     Rate and Rhythm: Normal rate and regular rhythm.     Heart sounds: Normal heart sounds.  Pulmonary:     Effort: No respiratory distress.     Breath sounds: No stridor. No wheezing.  Abdominal:     General: Bowel sounds are normal. There is no distension.     Palpations: Abdomen is soft. There is no mass.     Tenderness: There is no abdominal tenderness. There is no guarding or rebound.  Musculoskeletal:        General: No tenderness.     Cervical back: Normal range of motion and neck supple.  Lymphadenopathy:     Cervical: No cervical adenopathy.  Skin:    Findings: No erythema or rash.  Neurological:     Cranial Nerves: No cranial nerve deficit.     Motor: No abnormal muscle tone.     Coordination: Coordination normal.     Gait: Gait abnormal.     Deep Tendon Reflexes: Reflexes normal.  Psychiatric:        Behavior: Behavior normal.        Thought Content: Thought content normal.        Judgment: Judgment normal.   B wax   Procedure Note :     Procedure :  Ear irrigation right and left ears   Indication:  Cerumen impaction right and left ears   Risks, including pain, dizziness, eardrum perforation, bleeding, infection and others as well as benefits were explained to the patient in detail. Verbal consent was obtained and the patient agreed to proceed.    We used "The Elephant Ear Irrigation Device" filled with lukewarm water for irrigation. A large amount wax was recovered from both ears. Procedure has also required manual wax removal/instrumentation with an ear wax curette and ear forceps on the right and left ears.   Tolerated well. Complications: None.   Postprocedure instructions :  Call if problems.   Lab Results  Component Value Date   WBC 7.1 02/15/2020   HGB 12.2  02/15/2020   HCT 36.0 02/15/2020   PLT 292 02/15/2020   GLUCOSE 110 (H) 02/15/2020   CHOL 168 06/15/2020   TRIG 135 06/15/2020   HDL 67 06/15/2020   LDLDIRECT 162.1 10/28/2009   LDLCALC 78 06/15/2020   ALT 15 12/26/2019   AST 21  12/26/2019   NA 130 (L) 02/15/2020   K 4.4 02/15/2020   CL 98 02/15/2020   CREATININE 1.70 (H) 02/15/2020   BUN 41 (H) 02/15/2020   CO2 19 (L) 02/15/2020   TSH 2.32 09/16/2019   INR 0.89 05/20/2018    CT Chest Wo Contrast  Result Date: 03/18/2020 CLINICAL DATA:  Pulmonary nodule. EXAM: CT CHEST WITHOUT CONTRAST TECHNIQUE: Multidetector CT imaging of the chest was performed following the standard protocol without IV contrast. COMPARISON:  Chest CT 07/03/2019 FINDINGS: Cardiovascular: The heart size is normal. No substantial pericardial effusion. Coronary artery calcification is evident. Atherosclerotic calcification is noted in the wall of the thoracic aorta. Status post CABG. Mediastinum/Nodes: No mediastinal lymphadenopathy. No evidence for gross hilar lymphadenopathy although assessment is limited by the lack of intravenous contrast on today's study. The esophagus has normal imaging features. Interval progression of ill-defined upper normal left axillary nodes measuring up to 8 mm short axis. Likely reactive. Correlation for recent left upper extremity vaccination recommended. Lungs/Pleura: Trace pleuroparenchymal scarring noted right apex. Surgical scarring noted medial left upper lobe from prior wedge resection. The linear nodular opacity adjacent to the staple line measured previously at 7 mm is stable at 7 mm today (71/8). Air cyst in the left base measured previously at 1.3 x 1.3 cm is stable at 1.3 x 1.3 cm today with a thin smooth peripheral wall. Subpleural nodule identified as new on the previous study is stable measuring 7 x 4 mm today (106/8) compared to 8 x 5 mm previously. No new suspicious nodule or mass. No focal airspace consolidation. No pleural  effusion. 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 Musculoskeletal: No worrisome lytic or sclerotic osseous abnormality. IMPRESSION: 1. The subpleural peripheral right lower lobe nodule identified as new on the previous study and measured previously at 7 mm is stable since that exam. Consider repeat CT in 12 months to ensure continued stability. Stable air cyst left lower lobe with thin smooth peripheral wall, measuring 1.3 cm today. 2. Remaining pulmonary nodules are stable. No new pulmonary nodule or mass on today's study. 3. Interval progression of ill-defined upper normal left axillary nodes measuring up to 8 mm short axis. Not enlarged by CT criteria and likely reactive. Correlation for recent left upper extremity vaccination may prove helpful. 4. Aortic Atherosclerosis (ICD10-I70.0). Electronically Signed   By: Misty Stanley M.D.   On: 03/18/2020 14:50    Assessment & Plan:    [name], MD

## 2020-09-07 NOTE — Assessment & Plan Note (Signed)
See procedure 

## 2020-09-08 ENCOUNTER — Other Ambulatory Visit: Payer: Self-pay | Admitting: Internal Medicine

## 2020-09-08 MED ORDER — HYDROCODONE-ACETAMINOPHEN 5-325 MG PO TABS: 0.5000 | ORAL_TABLET | Freq: Four times a day (QID) | ORAL | 0 refills | Status: AC | PRN

## 2020-09-24 NOTE — Assessment & Plan Note (Signed)
On vitamin B12 

## 2020-09-24 NOTE — Assessment & Plan Note (Signed)
Continue with good hydration

## 2020-09-24 NOTE — Assessment & Plan Note (Signed)
Continue with atorvastatin 

## 2020-09-24 NOTE — Assessment & Plan Note (Signed)
Continue with Plavix,  losartan, metoprolol, atorvastatin

## 2020-09-24 NOTE — Assessment & Plan Note (Signed)
Cont w/ Levothroid 

## 2020-09-29 DIAGNOSIS — M0579 Rheumatoid arthritis with rheumatoid factor of multiple sites without organ or systems involvement: Secondary | ICD-10-CM | POA: Diagnosis not present

## 2020-11-04 ENCOUNTER — Ambulatory Visit: Payer: Medicare PPO | Admitting: Internal Medicine

## 2020-11-04 ENCOUNTER — Encounter: Payer: Self-pay | Admitting: Internal Medicine

## 2020-11-04 ENCOUNTER — Other Ambulatory Visit: Payer: Self-pay

## 2020-11-04 DIAGNOSIS — R058 Other specified cough: Secondary | ICD-10-CM

## 2020-11-04 DIAGNOSIS — J449 Chronic obstructive pulmonary disease, unspecified: Secondary | ICD-10-CM | POA: Diagnosis not present

## 2020-11-04 MED ORDER — GABAPENTIN 300 MG PO CAPS: ORAL_CAPSULE | ORAL | 2 refills | Status: DC

## 2020-11-04 NOTE — Assessment & Plan Note (Signed)
Quit smoking in her 72s - PFT's  02/19/15   FEV1 1.78 (90 % ) ratio 66  p 12 % improvement from saba p nothing  prior to study with DLCO  71 % corrects to 78 % for alv volume  And classical curvature  - Spirometry 06/19/2016  FEV1 1.65  (84%)  Ratio 69  With no prior rx  - PFT's  01/14/2018  FEV1 1.68 (90 % ) ratio 68  p 18 % improvement from saba p nothing  prior to study with DLCO  84 % corrects to 90  % for alv volume  - 01/14/2018    try BREO 100 each am > worse 02/06/2019 so d/c'd - 02/06/2019  After extensive coaching inhaler device,  effectiveness =   75% with hfa > change to symb 80 2bid and add spacer next ov if uacs component not improved  - 02/06/2019   Walked RA x one lap =  approx 250 ft - stopped due to  Sob with sats 95% avg pace  - 02/21/2019   Walked RA  2 laps @  approx 283ft each @ slow pace  stopped due to tired more than sob / sats 90%   - PFT's  09/18/19   FEV1 1.83 (101 % ) ratio 0.71  p 15 % improvement from saba p nothing prior to study with DLCO  17.91 (98%) corrects to 3.74 (91 %)  for alv volume and FV curve mild concavity on exp portion   09/26/2019   Walked RA 3 laps @ approx 289ft each @  Slow pace  stopped due sob several times  with sats 97% or higher - 12/04/2019  After extensive coaching inhaler device,  effectiveness =    75% with smi   - 05/04/2020 try stiolto one each am for throat discomfort despite 300 tid of gabapentin  > improved as of 11/04/2020 on stiolto one daily s increased doe   Pt is Group B in terms of symptom/risk and laba/lama therefore appropriate rx at this point >>>  Continue stiolto one puff each am   Re saba: I spent extra time with pt today reviewing appropriate use of albuterol for prn use on exertion with the following points: 1) saba is for relief of sob that does not improve by walking a slower pace or resting but rather if the pt does not improve after trying this first. 2) If the pt is convinced, as many are, that saba helps recover from activity  faster then it's easy to tell if this is the case by re-challenging : ie stop, take the inhaler, then p 5 minutes try the exact same activity (intensity of workload) that just caused the symptoms and see if they are substantially diminished or not after saba 3) if there is an activity that reproducibly causes the symptoms, try the saba 15 min before the activity on alternate days   If in fact the saba really does help, then fine to continue to use it prn but advised may need to look closer at the maintenance regimen being used to achieve better control of airways disease with exertion.

## 2020-11-04 NOTE — Progress Notes (Signed)
Subjective:   Patient ID: [name], female    DOB: 13-Jun-1940     MRN: [id number]     Brief patient profile:  34 yowf  Only smoked age 80 - 23  s obvious sequelae with new cough while on a cruise in September 2016 in [place] > cxr > ct chest > PET > referred to pulmonary clinic 02/15/2015 by Dr  Philis Fendt [facility] with GOLD I copd 02/19/15     History of Present Illness  02/15/2015 1st [facility] office visit/ Adalid Beckmann   Chief Complaint  Patient presents with   PULMONARY CONSULT    Pulmonary nodules. Pt reports some dyspnea with exertion, some morning dry to wet cough. Pt does not use any inhalers.   Cough better but some cc am congestion x years ? Worse in winter > total ? Usually swallows so can't say color or amt assoc with mild pnds  Doe only with exertion = MMRC1 = can walk nl pace, flat grade, can't hurry or go uphills or steps s mild sob  rec Please remember to go to the lab  department downstairs for your tests - we will call you with the results when they are available. 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 Cough is non prod. She is not using her albuterol inhaler.   after stirs has  lots of nonproductive coughing assoc with sense of pnds  no better on anoro,  Not limited by breathing from desired activities   rec Stop anoro and montelukast to see what difference this makes For drainage / throat tickle try take CHLORPHENIRAMINE  4 mg - take one every 4 hours as needed -  available over the counter-   - late add:  pred x 6 days prn flare x one - consider adding gabapentin next ov    09/26/2016  f/u ov/Ivelise Castillo re: GOLD I copd [name] / no worse breathing or coughing off anoro/montelukast but maint on nasacort  Chief Complaint  Patient presents with   Follow-up    Breathing is doing well and she is not coughing much. No new co's.  doe = MMRC1 = can walk nl pace, flat grade, can't hurry or go uphills or steps s sob   Only a little drainage p one chlorpheniramine in am and double strength at hs and sleep fine now rec Continue protonix and pepcid for another 2 months then fine to try to taper  off by stopping the protonix first and changing the pepcid 20 mg twice daily then taper the pepcid off as well and see if cough flares or not - if does flare, restart and you may need GI evaluation  For drainage / throat tickle try take CHLORPHENIRAMINE  4 mg - take one every 4 hours as needed - available over the counter- may cause drowsiness so start with just a bedtime dose or two and see how you  tolerate it before trying in daytime   If not satisfied we start you on gabapentin 100 mg three times daily     10/08/17  Excisional bx LUL c/w nec granuloma      12/03/2017 acute extended ov/Deleah Tison re: re-establish  sob gradually downhill x one year then much worse since June 2019 Chief Complaint  Patient presents with   Acute Visit    Increased cough since June- prod but unsure sputum color. She states her breathing has been gradually worse since her last visit May 2019.   still using chlorpheniramine  And still sleeping fine but wakes up feeling very congested at usual hour and then all during the day has same sensation but not coughing up excess/purulent mucus Doe gradually worse x one year  = MMRC2 = can't walk a nl pace on a flat grade s sob but does fine slow and flat  Arthritis good control on arava  rec For drainage / throat tickle try take CHLORPHENIRAMINE  4 mg - take one  every 4 hours as needed - available over the counter- may cause drowsiness so start with just a bedtime dose or two and see how you tolerate it before trying in daytime   Pantoprazole (protonix) 40 mg   Take  30-60 min before first meal of the day and Pepcid (famotidine)  20 mg one @  bedtime until return to office - this is the best way to tell whether stomach acid is contributing to your problem.   GERD diet  Pl   Add if cough/sob worse > Prednisone 10 mg take  4 each am x 2 days,   2 each am x 2 days,  1 each am x 2 days and stop       01/14/2018  f/u ov/Zorah Backes re:  GOLD I copd with reversible component . Did not take the prednisone  Chief Complaint  Patient presents with   Follow-up    PFT's done today. Her cough and SOB are about the same. No new co's.   Dyspnea:  MMRC1 = can walk nl pace, flat grade, can't hurry or go uphills or steps s sob   Cough: more of throat clearing/ daytime x years / p  rx fosfamax >>  worse since then  Sleeping: flat bed / one pillow SABA use: none 02: none  rec Start BREO 381 one click each am x 2 drags each am  Try off fosfamax for now  Please schedule a follow up office visit in 6 weeks, call sooner if needed     02/25/2018  f/u ov/Kazumi Lachney re: COPD GOLD 1/ maint breo and no fosfamax since last ov  Chief Complaint  Patient presents with   Follow-up    Breathing is unchanged. She does not have a rescue inhaler.   Dyspnea:   MMRC1 = can walk nl pace, flat grade, can't hurry or go uphills or steps s sob   Cough: no longer  Coughing/ not  Much throat clearing  Sleeping: bed is flat/ one pillow rec Ok to start back on fosfamax with no other changes for now to see what difference it make but if upper airway symptoms are worse you will need alternative (Recast/ prolia)  Remember to brush your teeth and tongue with [employer] based toothpaste and if not better we need to see you back for a different inhaler (you will need to bring your drug formulary with  you )     04/30/2018  f/u  ov/Royden Bulman re:  Copd GOLD I/ maint breo/ back on fosfamax > more throat clearing despite rx for gerd Chief Complaint  Patient presents with   Follow-up    Breathing is unchanged. No new co's. She does not have a rescue inhaler.   Dyspnea:  Still mmrc1  Cough: min throat clearing worse on fosfamax  Sleeping: bed is flat/ one or two pillows SABA use: none  02: none rec Try off fosfamax now  to see if throat clearing is better and if it is you Prolia or Reclast as options If not better after 3 months, bring your formulary with you and we'll try you on a different inhaler- try off the breo for a week prior to the visit you can   02/06/2019  f/u ov/Demya Scruggs re:  Gradually downhill back on biphosphonates /Breo with only GOLD I criteria previously  Chief Complaint  Patient presents with   Follow-up    Breathing is no better since last visit. She is getting SOB with exertion such as just standing up. She rarely uses her albuterol inhaler, does not feel it helps. She has non prod cough.   Dyspnea:  MMRC3 = can't walk 100 yards even at a slow pace at a flat grade s stopping due to sob   Cough: worse in fall and winter but day > noct with sensation of globus Sleeping: flat / one pillow no resp symptoms noct  SABA use: rarely, not helping  02: none  rec Pantoprazole (protonix) 40 mg   Take  30-60 min before first meal of the day and Pepcid (famotidine)  20 mg one after supper  until return to office - this is the best way to tell whether stomach acid is contributing to your problem and stop the risedronate for now  GERD diet  Prednisone 10 mg take  4 each am x 2 days,   2 each am x 2 days,  1 each am x 2 days and stop  Stop breo and start symbicort 80 Take 2 puffs first thing in am and then another 2 puffs about 12 hours later      02/21/2019  f/u ov/Naisha Wisdom re: GOLD I copd, worsening sob  Chief Complaint  Patient presents with   Follow-up    Breathing is unchanged. She  rarely uses her albuterol inhaler.   Dyspnea:  Room to room but also limited by L hip pain  Cough: all day sensation of globus but no mucus x years  Sleeping: flat/ one pillow  SABA use: used once and no better  02: none rec Stop symbicort to see what difference if any it makes Gabapentin is 100 mg three times daily  Stop losartan and start metaprolol 25 mg twice daily          03/20/2019  f/u ov/Likisha Alles AO:ZHYQ I copd/  cough improved p last ov  X 2 weeks then pred started and ex tol improved also Chief Complaint  Patient presents with   Follow-up    Breathing has improved since the last visit. She is on pred taper now per Dr Lenna Gilford.   Dyspnea:  Much better walking around friends homes x 10 min s stopping slow pace Cough: globus better but not gone  Sleeping: fine resp wise SABA use: none  02: none  rec Increase Lopressor 50 mg twice daily  Gabapentin 100 mg three times a day - call if throat discomfort is not improving to adjust up to maximum of 300 mg three times  a day    09/26/2019  f/u ov/Johnye Kist re: GOLD I copd/recurrent cough Jaynie Bream off gabapentin  Chief Complaint  Patient presents with   Follow-up    pt states sob all times.pt states not using rescue inhaler  Dyspnea:  One hall at a time  Then has to stop at [facility]  Cough: worse off gabapentin  Then ran out of h1 and flared / more yellow /productive in am/ convinced it's due to "drainage"  Sleeping: denies resp symptoms   Waking her  up  SABA use: doesn't help 02: no   rec Gabapentin 100 mg three times a day and if not satisfied with the cough and sensation of too much throat mucus  We will call to schedule a sinus CT Please remember to go to the  x-ray department  for your tests - we will call you with the results when they are available   12/04/2019  f/u ov/Breana Litts re:  Copd I / uacs  Chief Complaint  Patient presents with   Follow-up    Breathing is about the same. Cough has been worse- prod but unsure of sputum  color. She has not been using her albuterol inhaler.   Dyspnea:  One hallway and stops  Cough: sounds  juicier in am but nothing comes up  Sleeping: typically not waking her  SABA use: none 02: none  rec Stiolto  2 pffs first thing in am and you should  breathe better within a few days to a week and call for refill  Gabapentin 100 mg x 2 x three times a day for at least a week and then start gabapentin 300 three times a day    01/29/2020  f/u ov/Rashaan Wyles re: COPD I / uacs / not able to remember lunch time gabapentin Chief Complaint  Patient presents with   Follow-up    Breathing is overall doing well. She states does have some "congestion and a frog in my throat". She has not had to use her albuterol inhaler since her last visit.   Dyspnea: more limited by legs than sob  Cough: frog in throat and dry mouth since stiolto 2 each am but thinks breathing better  Sleeping: bed is flat SABA use: none 02: none  rec Try to reduce stiolto to one puff each am to see if helps your dry mouth (think of like high octane fuel ok to adjust up down or off) Ok to spread out the gabapentin 300 mg three times a day If throat problems continue you will need to be referred to [facility] Dr Carol Ada     05/04/2020  f/u ov/Ayerim Berquist re:  COPD 1 / RA  With UACS  On gabapentin 300 tid better/ did not follow stiolto recs Chief Complaint  Patient presents with   Follow-up    Breathing is unchanged since the last visit. She denies any new co's. Has not used her albuterol inhaler.   Dyspnea:  Walking hallways at [facility] about the same / both lower legs and sob same time  Cough: no change frog in throat and still on stiolto x 2  Sleeping: bed is flat / one pillow  SABA use: none  02: none  Rec Try to reduce stiolto to one puff each am to see if helps your dry mouth (think of like high octane fuel ok to adjust up down or off) Continue  gabapentin 300 mg three times a day   11/04/2020  f/u ov/Stephannie Broner re:  UACS/ GOLD I/ RA  stiolto maint one puff daily and gabapentin 300 tid  No chief complaint on file. Dyspnea:  walking halls at friends home x 10 min then tires Cough: none and wants to try off gerd rx  Sleeping: no resp cc prone bed flat SABA use: none  02: none  Covid status:   vax x 4 / never infected    No obvious day to day or daytime variability or assoc excess/ purulent sputum or mucus plugs or hemoptysis or cp or chest tightness, subjective wheeze or overt sinus or hb symptoms.   Sleeping  without nocturnal  or early am exacerbation  of respiratory  c/o's or need for noct saba. Also denies any obvious fluctuation of symptoms with weather or environmental changes or other aggravating or alleviating factors except as outlined above   No unusual exposure hx or h/o childhood pna/ asthma or knowledge of premature birth.  Current Allergies, Complete Past Medical History, Past Surgical History, Family History, and Social History were reviewed in [employer] record.  ROS  The following are not active complaints unless bolded Hoarseness, sore throat, dysphagia, dental problems, itching, sneezing,  nasal congestion or discharge of excess mucus or purulent secretions, ear ache,   fever, chills, sweats, unintended wt loss or wt gain, classically pleuritic or exertional cp,  orthopnea pnd or arm/hand swelling  or leg swelling pm only , presyncope, palpitations, abdominal pain, anorexia, nausea, vomiting, diarrhea  or change in bowel habits or change in bladder habits, change in stools or change in urine, dysuria, hematuria,  rash, arthralgias better, visual complaints, headache, numbness, weakness or ataxia or problems with walking or coordination,  change in mood or  memory.        Current Meds  Medication Sig   acetaminophen (TYLENOL) 500 MG tablet Take 500 mg by mouth every 6 (six) hours as needed (pain).    albuterol (VENTOLIN HFA) 108 (90 Base) MCG/ACT inhaler Inhale 2  puffs into the lungs every 6 (six) hours as needed for wheezing or shortness of breath.   amLODipine (NORVASC) 2.5 MG tablet Take 1 tablet (2.5 mg total) by mouth daily.   atorvastatin (LIPITOR) 80 MG tablet TAKE 1 TABLET EVERY DAY   Chlorpheniramine Maleate (CHLORPHEN PO) Take by mouth.   Cholecalciferol (VITAMIN D3) 50 MCG (2000 UT) capsule Take 1 capsule (2,000 Units total) by mouth daily.   clopidogrel (PLAVIX) 75 MG tablet TAKE 1 TABLET EVERY DAY   Cyanocobalamin (B-12) 2500 MCG TABS one po qd   famotidine (PEPCID) 20 MG tablet TAKE 1 TABLET AT BEDTIME   gabapentin (NEURONTIN) 300 MG capsule TAKE 1 CAPSULE BY MOUTH THREE TIMES A DAY   Golimumab (SIMPONI [place]) Every other month per Dr Trudie Reed   hydrochlorothiazide (HYDRODIURIL) 12.5 MG tablet TAKE 1 TABLET BY MOUTH EVERY DAY   HYDROcodone-acetaminophen (NORCO/VICODIN) 5-325 MG tablet Take 0.5-1 tablets by mouth every 6 (six) hours as needed for severe pain.   leflunomide (ARAVA) 20 MG tablet Take 20 mg by mouth daily.    levothyroxine (SYNTHROID) 88 MCG tablet Take 1 tablet (88 mcg total) by mouth daily before breakfast.   loperamide (IMODIUM A-D) 2 MG tablet Take 2 mg by mouth 4 (four) times daily as needed for diarrhea or loose stools.   losartan (COZAAR) 50 MG tablet Take 1 tablet (50 mg total) by mouth daily.   metoprolol tartrate (LOPRESSOR) 50 MG tablet Take 1.5 tablets (75 mg total) by mouth 2 (two) times daily.  pantoprazole (PROTONIX) 40 MG tablet TAKE 1 TABLET DAILY. TAKE 30 TO 60 MINUTES BEFORE FIRST MEAL OF THE DAY   psyllium (METAMUCIL) 58.6 % packet Take 1 packet by mouth daily.   Tiotropium Bromide-Olodaterol (STIOLTO RESPIMAT) 2.5-2.5 MCG/ACT AERS Inhale 2 puffs into the lungs daily. (Patient taking differently: Inhale 1 puff into the lungs daily.)   triamcinolone (NASACORT) 55 MCG/ACT AERO nasal inhaler Place 1 spray into the nose daily.                   Objective:   Physical Exam   11/04/2020           157   05/04/2020          153  01/29/2020         150  12/04/2019           148 09/26/2019         146  03/20/2019       151  02/21/2019       148 02/06/2019         148 04/30/2018      148 01/14/2018        144  12/03/2017          147  09/26/2016        141  08/07/2016          146  06/19/2016        152  05/22/2016        148   02/15/15 155 lb 6.4 oz (70.489 kg)  01/29/15 154 lb (69.854 kg)  11/26/14 153 lb 8 oz (69.627 kg)       Vital signs reviewed  11/04/2020  - Note at rest 02 sats  94% on RA   General appearance:    pleasant amb elderly wf nad   HEENT : pt wearing mask not removed for exam due to covid - 19 concerns.   NECK :  without JVD/Nodes/TM/ nl carotid upstrokes bilaterally   LUNGS: no acc muscle use,  Min barrel  contour chest wall with bilateral  slightly decreased bs s audible wheeze and  without cough on insp or exp maneuvers and min  Hyperresonant  to  percussion bilaterally     CV:  RRR  no s3 or murmur or increase in P2, and no edema   ABD:  soft and nontender with pos end  insp Hoover's  in the supine position. No bruits or organomegaly appreciated, bowel sounds nl  MS:   Nl gait/  ext warm without deformities, calf tenderness, cyanosis or clubbing No obvious joint restrictions   SKIN: warm and dry without lesions    NEURO:  alert, approp, nl sensorium with  no motor or cerebellar deficits apparent.                I personally reviewed images and agree with radiology impression as follows:   Chest CT s contrast 03/18/20 1. The subpleural peripheral right lower lobe nodule identified as new on the previous study and measured previously at 7 mm is stable since that exam. Consider repeat CT in 12 months to ensure continued stability. Stable air cyst left lower lobe with thin smooth peripheral wall, measuring 1.3 cm today. 2. Remaining pulmonary nodules are stable.         Assessment & Plan:

## 2020-11-04 NOTE — Patient Instructions (Signed)
Stop pantoprazole and Try pepcid 20 mg after breakfast and supper x 2 weeks, then just after supper x 2 weeks then stop  If cough or breathing get worse, resume:  Pantoprazole (protonix) 40 mg   Take  30-60 min before first meal of the day and Pepcid (famotidine)  20 mg after supper until return to office - this is the best way to tell whether stomach acid is contributing to your problem.    Please schedule a follow up visit in 12 months but call sooner if needed

## 2020-11-04 NOTE — Assessment & Plan Note (Signed)
Onset around 2008 - Allergy profile 05/22/2016 >  Eos 0.3 /  IgE  10 neg rast - singulair / gerd rx started 05/22/2016 >>> still with pnds 06/19/2016 > add dymista / 1st gen H1> no better but never took h1 so rec stop dymista  - no better 08/07/2016 on singulair/ anoro > rec stop both and just use prn saba/ h1 > improved 09/26/2016  - restarted gerd rx and 1st gen H1 blockers per guidelines  12/03/2017  - rec d/c fosfamax x 6 weeks 01/14/2018 > resolved 02/25/2018 so ok to rechallenge with fosfamax 02/25/2018 and flared again at ov 04/30/2018 so rec substitute prolia or Reclast  - 02/06/2019 flared again on biphosphonates > d/c permanently, consider reclast or prolia  - 02/21/2019 added gabapentin 100 tid for globus and stop losartan >>>>  50% better 03/20/2019 so rec titrate up to 300 mg tid if tolerates (did not do) - Sinus CT 10/08/2019 >>>  Nl  - 12/04/2019 titrate up to 300 mg gabapentin to tid > only taking bid as of 01/29/2020 so rec bfast, supper, bedtime dosing  > imroved as of 11/04/2020 and pt requesting off gerd rx > taper over one month   Now that upper airway symptoms have resolved on gabapentin reasonable to try to taper off gerd rx and restart if flares         Each maintenance medication was reviewed in detail including emphasizing most importantly the difference between maintenance and prns and under what circumstances the prns are to be triggered using an action plan format where appropriate.  Total time for H and P, chart review, counseling, reviewing smi/hfa  device(s) and generating customized AVS unique to this office visit / same day charting = 25 min

## 2020-11-20 ENCOUNTER — Other Ambulatory Visit: Payer: Self-pay | Admitting: Internal Medicine

## 2020-11-22 MED ORDER — LEVOTHYROXINE SODIUM 88 MCG PO TABS: 88.0000 ug | ORAL_TABLET | Freq: Every day | ORAL | 3 refills | Status: DC

## 2020-11-24 DIAGNOSIS — M0579 Rheumatoid arthritis with rheumatoid factor of multiple sites without organ or systems involvement: Secondary | ICD-10-CM | POA: Diagnosis not present

## 2020-11-29 DIAGNOSIS — Z79899 Other long term (current) drug therapy: Secondary | ICD-10-CM | POA: Diagnosis not present

## 2020-11-29 DIAGNOSIS — M0579 Rheumatoid arthritis with rheumatoid factor of multiple sites without organ or systems involvement: Secondary | ICD-10-CM | POA: Diagnosis not present

## 2020-11-29 DIAGNOSIS — E663 Overweight: Secondary | ICD-10-CM | POA: Diagnosis not present

## 2020-11-29 DIAGNOSIS — M2550 Pain in unspecified joint: Secondary | ICD-10-CM | POA: Diagnosis not present

## 2020-11-29 DIAGNOSIS — M150 Primary generalized (osteo)arthritis: Secondary | ICD-10-CM | POA: Diagnosis not present

## 2020-11-29 DIAGNOSIS — M545 Low back pain, unspecified: Secondary | ICD-10-CM | POA: Diagnosis not present

## 2020-11-29 DIAGNOSIS — Z6827 Body mass index (BMI) 27.0-27.9, adult: Secondary | ICD-10-CM | POA: Diagnosis not present

## 2020-12-09 ENCOUNTER — Ambulatory Visit: Payer: Medicare PPO | Admitting: Internal Medicine

## 2020-12-09 ENCOUNTER — Other Ambulatory Visit: Payer: Self-pay

## 2020-12-09 ENCOUNTER — Encounter: Payer: Self-pay | Admitting: Internal Medicine

## 2020-12-09 VITALS — BP 130/72 | HR 77 | Temp 98.0°F | Ht 63.0 in | Wt 156.0 lb

## 2020-12-09 DIAGNOSIS — I10 Essential (primary) hypertension: Secondary | ICD-10-CM

## 2020-12-09 DIAGNOSIS — N183 Chronic kidney disease, stage 3 unspecified: Secondary | ICD-10-CM

## 2020-12-09 DIAGNOSIS — M25552 Pain in left hip: Secondary | ICD-10-CM | POA: Diagnosis not present

## 2020-12-09 DIAGNOSIS — Z951 Presence of aortocoronary bypass graft: Secondary | ICD-10-CM

## 2020-12-09 DIAGNOSIS — E785 Hyperlipidemia, unspecified: Secondary | ICD-10-CM | POA: Diagnosis not present

## 2020-12-09 DIAGNOSIS — I700 Atherosclerosis of aorta: Secondary | ICD-10-CM | POA: Diagnosis not present

## 2020-12-09 LAB — BASIC METABOLIC PANEL
BUN: 60 mg/dL — ABNORMAL HIGH (ref 6–23)
CO2: 29 mEq/L (ref 19–32)
Calcium: 9.9 mg/dL (ref 8.4–10.5)
Chloride: 103 mEq/L (ref 96–112)
Creatinine, Ser: 2.18 mg/dL — ABNORMAL HIGH (ref 0.40–1.20)
GFR: 20.6 mL/min — ABNORMAL LOW (ref >60.00)
Glucose, Bld: 86 mg/dL (ref 70–99)
Potassium: 4.7 mEq/L (ref 3.5–5.1)
Sodium: 140 mEq/L (ref 135–145)

## 2020-12-09 NOTE — Assessment & Plan Note (Signed)
Cont on Lipitor 

## 2020-12-09 NOTE — Assessment & Plan Note (Signed)
L troch bursitis Injection was offered [employer]

## 2020-12-09 NOTE — Patient Instructions (Signed)
Hip Bursitis  Hip bursitis is swelling of one or more fluid-filled sacs (bursae) in your hip joint. This condition can cause pain, and your symptoms may comeand go over time. What are the causes? Repeated use of your hip muscles. Injury to the hip. Weak butt muscles. Bone spurs. Infection. In some cases, the cause may not be known. What increases the risk? You are more likely to develop this condition if: You had a past hip injury or hip surgery. You have a condition, such as arthritis, gout, diabetes, or thyroid disease. You have spine problems. You have one leg that is shorter than the other. You run a lot or do long-distance running. You play sports where there is a risk of injury or falling, such as football, martial arts, or skiing. What are the signs or symptoms? Symptoms may come and go, and they often include: Pain in the hip or groin area. Pain may get worse when you move your hip. Tenderness and swelling of the hip. In rare cases, the bursa may become infected. If this happens, you may get afever, as well as have warmth and redness in the hip area. How is this treated? This condition is treated by: Resting your hip. Icing your hip. Wrapping the hip area with an elastic bandage (compression wrap). Keeping the hip raised. Other treatments may include medicine, draining fluid out of the bursa, orusing crutches, a cane, or a walker. Surgery may be needed, but this is rare. Long-term treatment may include doing exercises to help your strength and flexibility. It may also include lifestyle changes like losing weight to lessenthe strain on your hip. Follow these instructions at home: Managing pain, stiffness, and swelling     If told, put ice on the painful area. Put ice in a plastic bag. Place a towel between your skin and the bag. Leave the ice on for 20 minutes, 2-3 times a day. Raise your hip by putting a pillow under your hips while you lie down. Stop if you feel  pain. If told, put heat on the affected area. Do this as often as told by your doctor. Use a moist heat pack or a heating pad as told by your doctor. Place a towel between your skin and the heat source. Leave the heat on for 20-30 minutes. Take off the heat if your skin turns bright red. This is very important if you are unable to feel pain, heat, or cold. You may have a greater risk of getting burned. Activity Do not use your hip to support your body weight until your doctor says that you can. Use crutches, a cane, or a walker as told by your doctor. If the affected leg is one that you use to drive, ask your doctor if it is safe to drive. Rest and protect your hip as much as you can until you feel better. Return to your normal activities as told by your doctor. Ask your doctor what activities are safe for you. Do exercises as told by your doctor. General instructions Take over-the-counter and prescription medicines only as told by your doctor. Gently rub and stretch your injured area as often as is comfortable. Wear elastic bandages only as told by your doctor. If one of your legs is shorter than the other, get fitted for a shoe insert or orthotic. Keep a healthy weight. Follow instructions from your doctor. Keep all follow-up visits as told by your doctor. This is important. How is this prevented? Exercise regularly, as told  by your doctor. Wear the right shoes for the sport you play. Warm up and stretch before being active. Cool down and stretch after being active. Take breaks often from repeated activity. Avoid activities that bother your hip or cause pain. Avoid sitting down for a long time. Where to find more information [employer]: orthoinfo.aaos.org Contact a doctor if: You have a fever. You have new symptoms. You have trouble walking or doing everyday activities. You have pain that gets worse or does not get better with medicine. Your skin  around your hip is red. You get a feeling of warmth in your hip area. Get help right away if: You cannot move your hip. You have very bad pain. You cannot control the muscles in your feet. Summary Hip bursitis is swelling of one or more fluid-filled sacs (bursae) in your hip joint. Symptoms often come and go over time. This condition is often treated by resting and icing the hip. It also may help to keep the area raised and wrapped in an elastic bandage. Other treatments may be needed. This information is not intended to replace advice given to you by your health care provider. Make sure you discuss any questions you have with your healthcare provider. Document Revised: 02/17/2019 Document Reviewed: 12/24/2017 Elsevier Patient Education  2022 [employer].

## 2020-12-09 NOTE — Assessment & Plan Note (Signed)
Cont with Plavix, Losartan, Metoprolol, Lipitor

## 2020-12-09 NOTE — Progress Notes (Signed)
Subjective:  Patient ID: [name], female    DOB: 11-25-1940  Age: 80 y.o. MRN: [id number]  CC: Follow-up (3 MONTH F/U)   HPI [name] presents for dyslipidemia, RA, HTN, CAD, CRI C/o L hip pain   Outpatient Medications Prior to Visit  Medication Sig Dispense Refill   acetaminophen (TYLENOL) 500 MG tablet Take 500 mg by mouth every 6 (six) hours as needed (pain).      albuterol (VENTOLIN HFA) 108 (90 Base) MCG/ACT inhaler Inhale 2 puffs into the lungs every 6 (six) hours as needed for wheezing or shortness of breath. 8 g 2   amLODipine (NORVASC) 2.5 MG tablet Take 1 tablet (2.5 mg total) by mouth daily. 90 tablet 3   atorvastatin (LIPITOR) 80 MG tablet TAKE 1 TABLET EVERY DAY 90 tablet 2   Chlorpheniramine Maleate (CHLORPHEN PO) Take by mouth.     Cholecalciferol (VITAMIN D3) 50 MCG (2000 UT) capsule Take 1 capsule (2,000 Units total) by mouth daily. 100 capsule 3   clopidogrel (PLAVIX) 75 MG tablet TAKE 1 TABLET EVERY DAY 90 tablet 3   Cyanocobalamin (B-12) 2500 MCG TABS one po qd 100 tablet 3   famotidine (PEPCID) 20 MG tablet TAKE 1 TABLET AT BEDTIME 90 tablet 3   gabapentin (NEURONTIN) 300 MG capsule TAKE 1 CAPSULE BY MOUTH THREE TIMES A DAY 270 capsule 2   Golimumab (SIMPONI [place]) Every other month per Dr Trudie Reed     hydrochlorothiazide (HYDRODIURIL) 12.5 MG tablet TAKE 1 TABLET BY MOUTH EVERY DAY 90 tablet 3   HYDROcodone-acetaminophen (NORCO/VICODIN) 5-325 MG tablet Take 0.5-1 tablets by mouth every 6 (six) hours as needed for severe pain. 20 tablet 0   leflunomide (ARAVA) 20 MG tablet Take 20 mg by mouth daily.      levothyroxine (SYNTHROID) 88 MCG tablet Take 1 tablet (88 mcg total) by mouth daily before breakfast. 90 tablet 3   loperamide (IMODIUM A-D) 2 MG tablet Take 2 mg by mouth 4 (four) times daily as needed for diarrhea or loose stools.     losartan (COZAAR) 50 MG tablet Take 1 tablet (50 mg total) by mouth daily. 90 tablet 3   metoprolol tartrate (LOPRESSOR)  50 MG tablet TAKE 1 AND 1/2 TABLETS TWICE DAILY 270 tablet 3   pantoprazole (PROTONIX) 40 MG tablet TAKE 1 TABLET DAILY. TAKE 30 TO 60 MINUTES BEFORE FIRST MEAL OF THE DAY 90 tablet 3   psyllium (METAMUCIL) 58.6 % packet Take 1 packet by mouth daily.     Tiotropium Bromide-Olodaterol (STIOLTO RESPIMAT) 2.5-2.5 MCG/ACT AERS Inhale 2 puffs into the lungs daily. (Patient taking differently: Inhale 1 puff into the lungs daily.) 4 g 11   triamcinolone (NASACORT) 55 MCG/ACT AERO nasal inhaler Place 1 spray into the nose daily.      No facility-administered medications prior to visit.    ROS: Review of Systems  Constitutional:  Negative for activity change, appetite change, chills, fatigue and unexpected weight change.  HENT:  Negative for congestion, mouth sores and sinus pressure.   Eyes:  Negative for visual disturbance.  Respiratory:  Negative for cough and chest tightness.   Gastrointestinal:  Negative for abdominal pain and nausea.  Genitourinary:  Negative for difficulty urinating, frequency and vaginal pain.  Musculoskeletal:  Positive for arthralgias and gait problem. Negative for back pain.  Skin:  Negative for pallor and rash.  Neurological:  Negative for dizziness, tremors, weakness, numbness and headaches.  Psychiatric/Behavioral:  Negative for confusion and sleep disturbance.  The patient is not nervous/anxious.    Objective:  BP 130/72 (BP Location: Left Arm)   Pulse 77   Temp 98 F (36.7 C) (Oral)   Ht 5\' 3"  (1.6 m)   Wt 156 lb (70.8 kg)   SpO2 96%   BMI 27.63 kg/m   BP Readings from Last 3 Encounters:  12/09/20 130/72  11/04/20 126/74  09/07/20 (!) 142/62    Wt Readings from Last 3 Encounters:  12/09/20 156 lb (70.8 kg)  11/04/20 157 lb (71.2 kg)  09/07/20 153 lb 9.6 oz (69.7 kg)    Physical Exam Constitutional:      General: She is not in acute distress.    Appearance: She is well-developed.  HENT:     Head: Normocephalic.     Right Ear: External ear  normal.     Left Ear: External ear normal.     Nose: Nose normal.  Eyes:     General:        Right eye: No discharge.        Left eye: No discharge.     Conjunctiva/sclera: Conjunctivae normal.     Pupils: Pupils are equal, round, and reactive to light.  Neck:     Thyroid: No thyromegaly.     Vascular: No JVD.     Trachea: No tracheal deviation.  Cardiovascular:     Rate and Rhythm: Normal rate and regular rhythm.     Heart sounds: Normal heart sounds.  Pulmonary:     Effort: No respiratory distress.     Breath sounds: No stridor. No wheezing.  Abdominal:     General: Bowel sounds are normal. There is no distension.     Palpations: Abdomen is soft. There is no mass.     Tenderness: There is no abdominal tenderness. There is no guarding or rebound.  Musculoskeletal:        General: Tenderness present.     Cervical back: Normal range of motion and neck supple. No rigidity.  Lymphadenopathy:     Cervical: No cervical adenopathy.  Skin:    Findings: No erythema or rash.  Neurological:     Cranial Nerves: No cranial nerve deficit.     Motor: No abnormal muscle tone.     Coordination: Coordination normal.     Deep Tendon Reflexes: Reflexes normal.  Psychiatric:        Behavior: Behavior normal.        Thought Content: Thought content normal.        Judgment: Judgment normal.  Painful LS w/ROM Antalgic gait - tilted pelvis L troch major is tender to palpation  Lab Results  Component Value Date   WBC 7.5 09/07/2020   HGB 12.1 09/07/2020   HCT 36.7 09/07/2020   PLT 314.0 09/07/2020   GLUCOSE 81 09/07/2020   CHOL 157 09/07/2020   TRIG 121.0 09/07/2020   HDL 59.30 09/07/2020   LDLDIRECT 162.1 10/28/2009   LDLCALC 74 09/07/2020   ALT 20 09/07/2020   AST 23 09/07/2020   NA 137 09/07/2020   K 5.2 (H) 09/07/2020   CL 100 09/07/2020   CREATININE 2.07 (H) 09/07/2020   BUN 58 (H) 09/07/2020   CO2 28 09/07/2020   TSH 2.82 09/07/2020   INR 0.89 05/20/2018    CT Chest Wo  Contrast  Result Date: 03/18/2020 CLINICAL DATA:  Pulmonary nodule. EXAM: CT CHEST WITHOUT CONTRAST TECHNIQUE: Multidetector CT imaging of the chest was performed following the standard protocol without IV contrast. COMPARISON:  Chest CT 07/03/2019 FINDINGS: Cardiovascular: The heart size is normal. No substantial pericardial effusion. Coronary artery calcification is evident. Atherosclerotic calcification is noted in the wall of the thoracic aorta. Status post CABG. Mediastinum/Nodes: No mediastinal lymphadenopathy. No evidence for gross hilar lymphadenopathy although assessment is limited by the lack of intravenous contrast on today's study. The esophagus has normal imaging features. Interval progression of ill-defined upper normal left axillary nodes measuring up to 8 mm short axis. Likely reactive. Correlation for recent left upper extremity vaccination recommended. Lungs/Pleura: Trace pleuroparenchymal scarring noted right apex. Surgical scarring noted medial left upper lobe from prior wedge resection. The linear nodular opacity adjacent to the staple line measured previously at 7 mm is stable at 7 mm today (71/8). Air cyst in the left base measured previously at 1.3 x 1.3 cm is stable at 1.3 x 1.3 cm today with a thin smooth peripheral wall. Subpleural nodule identified as new on the previous study is stable measuring 7 x 4 mm today (106/8) compared to 8 x 5 mm previously. No new suspicious nodule or mass. No focal airspace consolidation. No pleural effusion. 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 Musculoskeletal: No worrisome lytic or sclerotic osseous abnormality. IMPRESSION: 1. The subpleural peripheral right lower lobe nodule identified as new on the previous study and measured previously at 7 mm is stable since that exam. Consider repeat CT in 12 months to ensure continued stability. Stable air cyst left lower lobe with thin smooth peripheral wall,  measuring 1.3 cm today. 2. Remaining pulmonary nodules are stable. No new pulmonary nodule or mass on today's study. 3. Interval progression of ill-defined upper normal left axillary nodes measuring up to 8 mm short axis. Not enlarged by CT criteria and likely reactive. Correlation for recent left upper extremity vaccination may prove helpful. 4. Aortic Atherosclerosis (ICD10-I70.0). Electronically Signed   By: Misty Stanley M.D.   On: 03/18/2020 14:50    Assessment & Plan:     Follow-up: No follow-ups on file.  [name], MD

## 2020-12-09 NOTE — Assessment & Plan Note (Signed)
Cont w/good hydration 

## 2020-12-14 ENCOUNTER — Other Ambulatory Visit: Payer: Self-pay | Admitting: Internal Medicine

## 2020-12-21 DIAGNOSIS — E039 Hypothyroidism, unspecified: Secondary | ICD-10-CM | POA: Diagnosis not present

## 2020-12-21 DIAGNOSIS — G8929 Other chronic pain: Secondary | ICD-10-CM | POA: Diagnosis not present

## 2020-12-21 DIAGNOSIS — E785 Hyperlipidemia, unspecified: Secondary | ICD-10-CM | POA: Diagnosis not present

## 2020-12-21 DIAGNOSIS — Z888 Allergy status to other drugs, medicaments and biological substances status: Secondary | ICD-10-CM | POA: Diagnosis not present

## 2020-12-21 DIAGNOSIS — E663 Overweight: Secondary | ICD-10-CM | POA: Diagnosis not present

## 2020-12-21 DIAGNOSIS — I739 Peripheral vascular disease, unspecified: Secondary | ICD-10-CM | POA: Diagnosis not present

## 2020-12-21 DIAGNOSIS — Z886 Allergy status to analgesic agent status: Secondary | ICD-10-CM | POA: Diagnosis not present

## 2020-12-21 DIAGNOSIS — G629 Polyneuropathy, unspecified: Secondary | ICD-10-CM | POA: Diagnosis not present

## 2020-12-21 DIAGNOSIS — I10 Essential (primary) hypertension: Secondary | ICD-10-CM | POA: Diagnosis not present

## 2020-12-21 DIAGNOSIS — I2510 Atherosclerotic heart disease of native coronary artery without angina pectoris: Secondary | ICD-10-CM | POA: Diagnosis not present

## 2020-12-21 DIAGNOSIS — J45909 Unspecified asthma, uncomplicated: Secondary | ICD-10-CM | POA: Diagnosis not present

## 2021-01-07 DIAGNOSIS — M79672 Pain in left foot: Secondary | ICD-10-CM | POA: Diagnosis not present

## 2021-01-07 DIAGNOSIS — L602 Onychogryphosis: Secondary | ICD-10-CM | POA: Diagnosis not present

## 2021-01-07 DIAGNOSIS — M79671 Pain in right foot: Secondary | ICD-10-CM | POA: Diagnosis not present

## 2021-01-07 DIAGNOSIS — M2012 Hallux valgus (acquired), left foot: Secondary | ICD-10-CM | POA: Diagnosis not present

## 2021-01-15 ENCOUNTER — Other Ambulatory Visit: Payer: Self-pay | Admitting: Internal Medicine

## 2021-01-19 DIAGNOSIS — M0579 Rheumatoid arthritis with rheumatoid factor of multiple sites without organ or systems involvement: Secondary | ICD-10-CM | POA: Diagnosis not present

## 2021-02-04 ENCOUNTER — Other Ambulatory Visit: Payer: Self-pay | Admitting: *Deleted

## 2021-02-04 DIAGNOSIS — R918 Other nonspecific abnormal finding of lung field: Secondary | ICD-10-CM

## 2021-02-04 NOTE — Progress Notes (Unsigned)
Ct ch 

## 2021-03-15 ENCOUNTER — Encounter: Payer: Self-pay | Admitting: Internal Medicine

## 2021-03-15 ENCOUNTER — Ambulatory Visit: Payer: Medicare PPO | Admitting: Internal Medicine

## 2021-03-15 ENCOUNTER — Other Ambulatory Visit: Payer: Self-pay

## 2021-03-15 DIAGNOSIS — N183 Chronic kidney disease, stage 3 unspecified: Secondary | ICD-10-CM | POA: Diagnosis not present

## 2021-03-15 DIAGNOSIS — R269 Unspecified abnormalities of gait and mobility: Secondary | ICD-10-CM

## 2021-03-15 DIAGNOSIS — E034 Atrophy of thyroid (acquired): Secondary | ICD-10-CM

## 2021-03-15 DIAGNOSIS — M069 Rheumatoid arthritis, unspecified: Secondary | ICD-10-CM | POA: Diagnosis not present

## 2021-03-15 DIAGNOSIS — M25552 Pain in left hip: Secondary | ICD-10-CM

## 2021-03-15 DIAGNOSIS — E785 Hyperlipidemia, unspecified: Secondary | ICD-10-CM

## 2021-03-15 DIAGNOSIS — D510 Vitamin B12 deficiency anemia due to intrinsic factor deficiency: Secondary | ICD-10-CM

## 2021-03-15 MED ORDER — METHYLPREDNISOLONE 4 MG PO TBPK: ORAL_TABLET | ORAL | 1 refills | Status: DC

## 2021-03-15 MED ORDER — METHYLPREDNISOLONE ACETATE 80 MG/ML IJ SUSP
80.0000 mg | Freq: Once | INTRAMUSCULAR | Status: AC
  Administered 2021-03-15: 80 mg via INTRAMUSCULAR

## 2021-03-15 MED ORDER — LIDOCAINE-EPINEPHRINE 2 %-1:100000 IJ SOLN
3.0000 mL | Freq: Once | INTRAMUSCULAR | Status: AC
Start: 2021-03-15 — End: 2021-03-15
  Administered 2021-03-15: 3 mL

## 2021-03-15 NOTE — Assessment & Plan Note (Signed)
Worse - will inject: see procedure Medrol pack if worse during her trip in Jan 2023

## 2021-03-15 NOTE — Assessment & Plan Note (Addendum)
Use a cane/walker for travel

## 2021-03-15 NOTE — Assessment & Plan Note (Signed)
On Levothroid 

## 2021-03-15 NOTE — Assessment & Plan Note (Signed)
Medrol pack for travel - prn

## 2021-03-15 NOTE — Progress Notes (Signed)
Subjective:  Patient ID: [name], female    DOB: Jul 06, 1940  Age: 80 y.o. MRN: [id number]  CC: Follow-up (3 month f/u)   HPI [name] presents for L hip pain - worse. F/u RA C/o L hip pain - worse Going to [place] in Jan 2023  Outpatient Medications Prior to Visit  Medication Sig Dispense Refill   acetaminophen (TYLENOL) 500 MG tablet Take 500 mg by mouth every 6 (six) hours as needed (pain).      albuterol (VENTOLIN HFA) 108 (90 Base) MCG/ACT inhaler Inhale 2 puffs into the lungs every 6 (six) hours as needed for wheezing or shortness of breath. 8 g 2   amLODipine (NORVASC) 2.5 MG tablet TAKE 1 TABLET BY MOUTH EVERY DAY 90 tablet 3   atorvastatin (LIPITOR) 80 MG tablet TAKE 1 TABLET EVERY DAY 90 tablet 2   Chlorpheniramine Maleate (CHLORPHEN PO) Take by mouth.     Cholecalciferol (VITAMIN D3) 50 MCG (2000 UT) capsule Take 1 capsule (2,000 Units total) by mouth daily. 100 capsule 3   clopidogrel (PLAVIX) 75 MG tablet TAKE 1 TABLET EVERY DAY 90 tablet 3   Cyanocobalamin (B-12) 2500 MCG TABS one po qd 100 tablet 3   famotidine (PEPCID) 20 MG tablet TAKE 1 TABLET AT BEDTIME 90 tablet 3   gabapentin (NEURONTIN) 300 MG capsule TAKE 1 CAPSULE BY MOUTH THREE TIMES A DAY 270 capsule 2   Golimumab (SIMPONI [place]) Every other month per Dr Trudie Reed     hydrochlorothiazide (HYDRODIURIL) 12.5 MG tablet TAKE 1 TABLET BY MOUTH EVERY DAY 90 tablet 3   HYDROcodone-acetaminophen (NORCO/VICODIN) 5-325 MG tablet Take 0.5-1 tablets by mouth every 6 (six) hours as needed for severe pain. 20 tablet 0   leflunomide (ARAVA) 20 MG tablet Take 20 mg by mouth daily.      levothyroxine (SYNTHROID) 88 MCG tablet Take 1 tablet (88 mcg total) by mouth daily before breakfast. 90 tablet 3   loperamide (IMODIUM A-D) 2 MG tablet Take 2 mg by mouth 4 (four) times daily as needed for diarrhea or loose stools.     losartan (COZAAR) 50 MG tablet Take 1 tablet (50 mg total) by mouth daily. 90 tablet 3   metoprolol  tartrate (LOPRESSOR) 50 MG tablet TAKE 1 AND 1/2 TABLETS TWICE DAILY 270 tablet 3   pantoprazole (PROTONIX) 40 MG tablet TAKE 1 TABLET DAILY. TAKE 30 TO 60 MINUTES BEFORE FIRST MEAL OF THE DAY 90 tablet 3   psyllium (METAMUCIL) 58.6 % packet Take 1 packet by mouth daily.     Tiotropium Bromide-Olodaterol (STIOLTO RESPIMAT) 2.5-2.5 MCG/ACT AERS INHALE 2 PUFFS BY MOUTH INTO THE LUNGS DAILY 4 g 11   triamcinolone (NASACORT) 55 MCG/ACT AERO nasal inhaler Place 1 spray into the nose daily.      No facility-administered medications prior to visit.    ROS: Review of Systems  Objective:  BP (!) 142/80 (BP Location: Left Arm)   Pulse 72   Temp 97.9 F (36.6 C) (Oral)   Ht 5\' 3"  (1.6 m)   Wt 156 lb 6.4 oz (70.9 kg)   SpO2 95%   BMI 27.71 kg/m   BP Readings from Last 3 Encounters:  03/15/21 (!) 142/80  12/09/20 130/72  11/04/20 126/74    Wt Readings from Last 3 Encounters:  03/15/21 156 lb 6.4 oz (70.9 kg)  12/09/20 156 lb (70.8 kg)  11/04/20 157 lb (71.2 kg)    Physical Exam L hip w/lat pain   Procedure  Note :     Procedure : Joint Injection,  L  hip   Indication:  Trochanteric bursitis with refractory  chronic pain.   Risks including unsuccessful procedure , bleeding, infection, bruising, skin atrophy, "steroid flare-up" and others were explained to the patient in detail as well as the benefits. Informed consent was obtained verbally.  Tthe patient was placed in a comfortable lateral decubitus position. The point of maximal tenderness was identified. Skin was prepped with Betadine and alcohol. Then, a 5 cc syringe with a 2 inch long 24-gauge needle was used for a bursa injection.. The needle was advanced  Into the bursa. I injected the bursa with 4 mL of 2% lidocaine and 40 mg of Depo-Medrol .  Band-Aid was applied.   Tolerated well. Complications: None. Good pain relief following the procedure.   Postprocedure instructions :    A Band-Aid should be left on for 12 hours.  Injection therapy is not a cure itself. It is used in conjunction with other modalities. You can use nonsteroidal anti-inflammatories like ibuprofen , hot and cold compresses. Rest is recommended in the next 24 hours. You need to report immediately  if fever, chills or any signs of infection develop.  Lab Results  Component Value Date   WBC 7.5 09/07/2020   HGB 12.1 09/07/2020   HCT 36.7 09/07/2020   PLT 314.0 09/07/2020   GLUCOSE 86 12/09/2020   CHOL 157 09/07/2020   TRIG 121.0 09/07/2020   HDL 59.30 09/07/2020   LDLDIRECT 162.1 10/28/2009   LDLCALC 74 09/07/2020   ALT 20 09/07/2020   AST 23 09/07/2020   NA 140 12/09/2020   K 4.7 12/09/2020   CL 103 12/09/2020   CREATININE 2.18 (H) 12/09/2020   BUN 60 (H) 12/09/2020   CO2 29 12/09/2020   TSH 2.82 09/07/2020   INR 0.89 05/20/2018    CT Chest Wo Contrast  Result Date: 03/18/2020 CLINICAL DATA:  Pulmonary nodule. EXAM: CT CHEST WITHOUT CONTRAST TECHNIQUE: Multidetector CT imaging of the chest was performed following the standard protocol without IV contrast. COMPARISON:  Chest CT 07/03/2019 FINDINGS: Cardiovascular: The heart size is normal. No substantial pericardial effusion. Coronary artery calcification is evident. Atherosclerotic calcification is noted in the wall of the thoracic aorta. Status post CABG. Mediastinum/Nodes: No mediastinal lymphadenopathy. No evidence for gross hilar lymphadenopathy although assessment is limited by the lack of intravenous contrast on today's study. The esophagus has normal imaging features. Interval progression of ill-defined upper normal left axillary nodes measuring up to 8 mm short axis. Likely reactive. Correlation for recent left upper extremity vaccination recommended. Lungs/Pleura: Trace pleuroparenchymal scarring noted right apex. Surgical scarring noted medial left upper lobe from prior wedge resection. The linear nodular opacity adjacent to the staple line measured previously at 7 mm is  stable at 7 mm today (71/8). Air cyst in the left base measured previously at 1.3 x 1.3 cm is stable at 1.3 x 1.3 cm today with a thin smooth peripheral wall. Subpleural nodule identified as new on the previous study is stable measuring 7 x 4 mm today (106/8) compared to 8 x 5 mm previously. No new suspicious nodule or mass. No focal airspace consolidation. No pleural effusion. 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 Musculoskeletal: No worrisome lytic or sclerotic osseous abnormality. IMPRESSION: 1. The subpleural peripheral right lower lobe nodule identified as new on the previous study and measured previously at 7 mm is stable since that exam. Consider repeat  CT in 12 months to ensure continued stability. Stable air cyst left lower lobe with thin smooth peripheral wall, measuring 1.3 cm today. 2. Remaining pulmonary nodules are stable. No new pulmonary nodule or mass on today's study. 3. Interval progression of ill-defined upper normal left axillary nodes measuring up to 8 mm short axis. Not enlarged by CT criteria and likely reactive. Correlation for recent left upper extremity vaccination may prove helpful. 4. Aortic Atherosclerosis (ICD10-I70.0). Electronically Signed   By: Misty Stanley M.D.   On: 03/18/2020 14:50    Assessment & Plan:   Problem List Items Addressed This Visit     Pernicious anemia (Chronic)    On B12      CRI (chronic renal insufficiency), stage 3 (moderate) (HCC)    Continue to monitor GFR. Cont w/good hydration      Dyslipidemia, goal LDL below 70    Cont on Lipitor      Gait disorder    Use a cane/walker for travel      Hypothyroidism    On Levothroid      Left hip pain    Worse - will inject: see procedure Medrol pack if worse during her trip in Jan 2023      Rheumatoid arthritis ([place])    Medrol pack for travel - prn      Relevant Medications   methylPREDNISolone (MEDROL DOSEPAK) 4 MG TBPK tablet       Meds ordered this encounter  Medications   methylPREDNISolone (MEDROL DOSEPAK) 4 MG TBPK tablet    Sig: As directed    Dispense:  21 tablet    Refill:  1   methylPREDNISolone acetate (DEPO-MEDROL) injection 80 mg   lidocaine-EPINEPHrine (XYLOCAINE W/EPI) 2 %-1:100000 (with pres) injection 3 mL      Follow-up: Return in about 3 months (around 06/15/2021) for a follow-up visit.  [name], MD

## 2021-03-15 NOTE — Assessment & Plan Note (Signed)
On B12 

## 2021-03-15 NOTE — Patient Instructions (Addendum)
SHIBUMI SHADE?  Trekking poles?   Postprocedure instructions :    A Band-Aid should be left on for 12 hours. Injection therapy is not a cure itself. It is used in conjunction with other modalities. You can use nonsteroidal anti-inflammatories like ibuprofen , hot and cold compresses. Rest is recommended in the next 24 hours. You need to report immediately  if fever, chills or any signs of infection develop.

## 2021-03-15 NOTE — Assessment & Plan Note (Signed)
Continue to monitor GFR. Cont w/good hydration

## 2021-03-15 NOTE — Assessment & Plan Note (Signed)
Cont on Lipitor 

## 2021-03-16 DIAGNOSIS — M0579 Rheumatoid arthritis with rheumatoid factor of multiple sites without organ or systems involvement: Secondary | ICD-10-CM | POA: Diagnosis not present

## 2021-03-16 DIAGNOSIS — Z79899 Other long term (current) drug therapy: Secondary | ICD-10-CM | POA: Diagnosis not present

## 2021-03-17 ENCOUNTER — Ambulatory Visit
Admission: RE | Admit: 2021-03-17 | Discharge: 2021-03-17 | Disposition: A | Discharge status: Home / Self Care | Payer: Medicare PPO | Source: Ambulatory Visit | Attending: Thoracic Surgery (Cardiothoracic Vascular Surgery) | Admitting: Thoracic Surgery (Cardiothoracic Vascular Surgery)

## 2021-03-17 DIAGNOSIS — R918 Other nonspecific abnormal finding of lung field: Secondary | ICD-10-CM

## 2021-03-17 DIAGNOSIS — Z85110 Personal history of malignant carcinoid tumor of bronchus and lung: Secondary | ICD-10-CM | POA: Diagnosis not present

## 2021-03-17 DIAGNOSIS — I700 Atherosclerosis of aorta: Secondary | ICD-10-CM | POA: Diagnosis not present

## 2021-03-17 DIAGNOSIS — R911 Solitary pulmonary nodule: Secondary | ICD-10-CM | POA: Diagnosis not present

## 2021-03-21 DIAGNOSIS — M810 Age-related osteoporosis without current pathological fracture: Secondary | ICD-10-CM | POA: Diagnosis not present

## 2021-03-22 ENCOUNTER — Encounter: Payer: Self-pay | Admitting: Thoracic Surgery (Cardiothoracic Vascular Surgery)

## 2021-03-22 ENCOUNTER — Ambulatory Visit: Payer: Medicare PPO | Admitting: Thoracic Surgery (Cardiothoracic Vascular Surgery)

## 2021-03-22 ENCOUNTER — Other Ambulatory Visit: Payer: Self-pay

## 2021-03-22 VITALS — BP 130/74 | HR 76 | Resp 20 | Ht 63.0 in | Wt 156.0 lb

## 2021-03-22 DIAGNOSIS — R911 Solitary pulmonary nodule: Secondary | ICD-10-CM | POA: Diagnosis not present

## 2021-03-22 NOTE — Progress Notes (Signed)
[address]       [place] [address]             [phone]      HPI: [name] returns for follow-up of lung nodules.  [name] is an 80 year old woman with a past medical history of lung nodules, CAD, CABG, AAA repair, hypertension, hyperlipidemia, hypothyroidism, polymyalgia rheumatica, rheumatoid arthritis, and TIA.  She was followed by Dr. Servando Snare for waxing and waning lung nodules dating back to about 2016.  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  She had a needle biopsy of the left lower lobe nodule in January 2022.  It also showed inflammatory changes but no malignancy.  She smoked for about 7 years prior to quitting 62 years ago.  She last saw Dr. Servando Snare in November 2021.  There was a 7 mm right subpleural nodule that was stable from the year prior.  Radiology recommended a repeat CT to follow-up that nodule.  She now has had that done and returns to discuss the results.  She has been feeling well.  She been having some trouble with her left hip.  She is not having any fevers, chills, or sweats.  No change in her respiratory status.  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 Most likely scar. Mild subendocardial ischemia. EF 71% LOW RISK.    Cervical disc disease    fracture   Chronic kidney disease    COPD (chronic obstructive pulmonary disease) ([place])    Diverticulosis     Hyperlipidemia    Hypertension    Hypothyroidism (acquired)    hypo   Myocardial infarction [facility]) Aug. 2016   TIA   Polymyalgia rheumatica ([place])    2011 Dr. Charlestine Night   Rheumatoid arthritis [facility]) 2011   Dr.Truslow; fracture knees, hands and wrists -    S/P CABG x 1 1996   CABG--LIMA-LAD for ostial LAD (not felt to be PCI amenable). EF NORMAL then; LIMA now atretic   Shortness of breath dyspnea    with exertion   Stroke [facility]) 11-2014   TIA    Urinary frequency     Current Outpatient Medications  Medication Sig Dispense Refill   acetaminophen (TYLENOL) 500 MG tablet Take 500 mg by mouth every 6 (six) hours as needed (pain).      albuterol (VENTOLIN HFA) 108 (90 Base) MCG/ACT inhaler Inhale 2 puffs into the lungs every 6 (six) hours as needed for wheezing or shortness of breath. 8 g 2   amLODipine (NORVASC) 2.5 MG tablet TAKE 1 TABLET BY MOUTH EVERY DAY 90 tablet 3   atorvastatin (LIPITOR) 80 MG tablet TAKE 1 TABLET EVERY DAY 90 tablet 2   Chlorpheniramine Maleate (CHLORPHEN PO) Take by mouth.     Cholecalciferol (VITAMIN D3) 50 MCG (2000 UT) capsule Take 1  capsule (2,000 Units total) by mouth daily. 100 capsule 3   clopidogrel (PLAVIX) 75 MG tablet TAKE 1 TABLET EVERY DAY 90 tablet 3   Cyanocobalamin (B-12) 2500 MCG TABS one po qd 100 tablet 3   famotidine (PEPCID) 20 MG tablet TAKE 1 TABLET AT BEDTIME 90 tablet 3   gabapentin (NEURONTIN) 300 MG capsule TAKE 1 CAPSULE BY MOUTH THREE TIMES A DAY 270 capsule 2   Golimumab (SIMPONI [place]) Every other month per Dr Trudie Reed     hydrochlorothiazide (HYDRODIURIL) 12.5 MG tablet TAKE 1 TABLET BY MOUTH EVERY DAY 90 tablet 3   HYDROcodone-acetaminophen (NORCO/VICODIN) 5-325 MG tablet Take 0.5-1 tablets by mouth every 6 (six) hours as needed for severe pain. 20 tablet 0   leflunomide (ARAVA) 20 MG tablet Take 20 mg by mouth daily.      levothyroxine (SYNTHROID) 88 MCG tablet Take 1 tablet (88 mcg total) by mouth daily before breakfast. 90 tablet 3    loperamide (IMODIUM A-D) 2 MG tablet Take 2 mg by mouth 4 (four) times daily as needed for diarrhea or loose stools.     losartan (COZAAR) 50 MG tablet Take 1 tablet (50 mg total) by mouth daily. 90 tablet 3   methylPREDNISolone (MEDROL DOSEPAK) 4 MG TBPK tablet As directed 21 tablet 1   metoprolol tartrate (LOPRESSOR) 50 MG tablet TAKE 1 AND 1/2 TABLETS TWICE DAILY 270 tablet 3   pantoprazole (PROTONIX) 40 MG tablet TAKE 1 TABLET DAILY. TAKE 30 TO 60 MINUTES BEFORE FIRST MEAL OF THE DAY 90 tablet 3   psyllium (METAMUCIL) 58.6 % packet Take 1 packet by mouth daily.     Tiotropium Bromide-Olodaterol (STIOLTO RESPIMAT) 2.5-2.5 MCG/ACT AERS INHALE 2 PUFFS BY MOUTH INTO THE LUNGS DAILY 4 g 11   triamcinolone (NASACORT) 55 MCG/ACT AERO nasal inhaler Place 1 spray into the nose daily.      No current facility-administered medications for this visit.    Physical Exam BP 130/74   Pulse 76   Resp 20   Ht 5\' 3"  (1.6 m)   Wt 156 lb (70.8 kg)   SpO2 96% Comment: RA  BMI 27.40 kg/m  80 year old woman in no acute distress Alert and oriented x3 with no focal neurologic deficits Lungs clear bilaterally Cardiac regular rate and rhythm, no murmur No cervical supraclavicular adenopathy  Diagnostic Tests: CT CHEST WITHOUT CONTRAST   TECHNIQUE: Multidetector CT imaging of the chest was performed following the standard protocol without IV contrast.   COMPARISON:  03/18/2020   FINDINGS: Cardiovascular: Aortic atherosclerosis. Cardiomegaly. Left and right coronary artery calcifications. No pericardial effusion.   Mediastinum/Nodes: No enlarged mediastinal, hilar, or axillary lymph nodes. Thyroid gland, trachea, and esophagus demonstrate no significant findings.   Lungs/Pleura: Unchanged 0.6 cm subpleural nodule of the peripheral right lower lobe (series 8, image 117). Unchanged postoperative findings of medial left upper lobe wedge resection (series 8, image 36). Bandlike scarring of the  bilateral lung bases. No pleural effusion or pneumothorax.   Upper Abdomen: No acute abnormality.   Musculoskeletal: No chest wall mass or suspicious bone lesions identified.   IMPRESSION: 1. Unchanged 0.6 cm subpleural nodule of the peripheral right lower lobe, almost certainly benign given established stability. No further follow-up is specifically indicated for this nodule. Continued follow-up as indicated by clinical oncology surveillance protocol given history of lung cancer. 2. Unchanged postoperative findings of medial left upper lobe wedge resection. No evidence of malignant recurrence. 3. Coronary artery disease.   Aortic Atherosclerosis (ICD10-I70.0).  Electronically Signed   By: Delanna Ahmadi M.D.   On: 03/17/2021 11:31 I personally reviewed the chest CT images.  There are chronic changes in the lungs bilaterally.  No suspicious findings.  Impression: [name] is an 80 year old woman ith a past medical history of lung nodules, CAD, CABG, AAA repair, hypertension, hyperlipidemia, hypothyroidism, polymyalgia rheumatica, rheumatoid arthritis, and TIA.  She has had waxing and waning lung nodules.  In 2016 she had a wedge resection for necrotizing granuloma.  In 2020 she had a needle biopsy of a lung nodule which showed inflammatory changes.  Her current CT is unchanged from a year ago which in itself was also unchanged from the year prior to that.  There is a 7 mm subpleural nodule on the right that has been stable over time.  Findings consistent with a benign nodule.  No indication for further CT scanning from our standpoint.  Plan: Continue to follow-up with Dr. Alain Marion and Dr. Melvyn Novas We will be happy to see her back anytime in the future if we can be of any further assistance with her care  Melrose Nakayama, MD [facility] [phone]

## 2021-03-31 ENCOUNTER — Encounter: Payer: Self-pay | Admitting: Internal Medicine

## 2021-03-31 DIAGNOSIS — Z1231 Encounter for screening mammogram for malignant neoplasm of breast: Secondary | ICD-10-CM | POA: Diagnosis not present

## 2021-04-07 ENCOUNTER — Other Ambulatory Visit: Payer: Self-pay | Admitting: Adult Health

## 2021-04-08 DIAGNOSIS — H5010 Unspecified exotropia: Secondary | ICD-10-CM | POA: Diagnosis not present

## 2021-04-08 DIAGNOSIS — H35372 Puckering of macula, left eye: Secondary | ICD-10-CM | POA: Diagnosis not present

## 2021-04-08 DIAGNOSIS — Z961 Presence of intraocular lens: Secondary | ICD-10-CM | POA: Diagnosis not present

## 2021-04-08 DIAGNOSIS — H5213 Myopia, bilateral: Secondary | ICD-10-CM | POA: Diagnosis not present

## 2021-04-22 DIAGNOSIS — M79672 Pain in left foot: Secondary | ICD-10-CM | POA: Diagnosis not present

## 2021-04-22 DIAGNOSIS — L602 Onychogryphosis: Secondary | ICD-10-CM | POA: Diagnosis not present

## 2021-04-22 DIAGNOSIS — M79671 Pain in right foot: Secondary | ICD-10-CM | POA: Diagnosis not present

## 2021-04-22 DIAGNOSIS — L84 Corns and callosities: Secondary | ICD-10-CM | POA: Diagnosis not present

## 2021-04-26 DIAGNOSIS — Z85828 Personal history of other malignant neoplasm of skin: Secondary | ICD-10-CM | POA: Diagnosis not present

## 2021-04-26 DIAGNOSIS — L821 Other seborrheic keratosis: Secondary | ICD-10-CM | POA: Diagnosis not present

## 2021-04-26 DIAGNOSIS — C44519 Basal cell carcinoma of skin of other part of trunk: Secondary | ICD-10-CM | POA: Diagnosis not present

## 2021-04-26 DIAGNOSIS — D485 Neoplasm of uncertain behavior of skin: Secondary | ICD-10-CM | POA: Diagnosis not present

## 2021-04-26 DIAGNOSIS — L281 Prurigo nodularis: Secondary | ICD-10-CM | POA: Diagnosis not present

## 2021-04-26 DIAGNOSIS — D2262 Melanocytic nevi of left upper limb, including shoulder: Secondary | ICD-10-CM | POA: Diagnosis not present

## 2021-04-26 DIAGNOSIS — L820 Inflamed seborrheic keratosis: Secondary | ICD-10-CM | POA: Diagnosis not present

## 2021-04-26 DIAGNOSIS — C44311 Basal cell carcinoma of skin of nose: Secondary | ICD-10-CM | POA: Diagnosis not present

## 2021-05-11 DIAGNOSIS — M0579 Rheumatoid arthritis with rheumatoid factor of multiple sites without organ or systems involvement: Secondary | ICD-10-CM | POA: Diagnosis not present

## 2021-05-11 DIAGNOSIS — Z79899 Other long term (current) drug therapy: Secondary | ICD-10-CM | POA: Diagnosis not present

## 2021-05-30 ENCOUNTER — Ambulatory Visit (INDEPENDENT_AMBULATORY_CARE_PROVIDER_SITE_OTHER): Payer: Medicare PPO

## 2021-05-30 ENCOUNTER — Other Ambulatory Visit: Payer: Self-pay

## 2021-05-30 DIAGNOSIS — Z0000 Encounter for general adult medical examination without abnormal findings: Secondary | ICD-10-CM | POA: Diagnosis not present

## 2021-05-30 NOTE — Progress Notes (Addendum)
Subjective:   [name] is a 81 y.o. female who presents for Medicare Annual (Subsequent) preventive examination.  I connected with [name] today by telephone and verified that I am speaking with the correct person using two identifiers. Location patient: home Location provider: work Persons participating in the virtual visit: patient, provider.   I discussed the limitations, risks, security and privacy concerns of performing an evaluation and management service by telephone and the availability of in person appointments. I also discussed with the patient that there may be a patient responsible charge related to this service. The patient expressed understanding and verbally consented to this telephonic visit.    Interactive audio and video telecommunications were attempted between this provider and patient, however failed, due to patient having technical difficulties OR patient did not have access to video capability.  We continued and completed visit with audio only.    Review of Systems     Cardiac Risk Factors include: advanced age (>61men, >1 women);dyslipidemia;hypertension     Objective:    Today's Vitals   There is no height or weight on file to calculate BMI.  Advanced Directives 05/30/2021 02/15/2020 01/22/2020 09/01/2019 08/05/2019 01/16/2019 05/20/2018  Does Patient Have a Medical Advance Directive? Yes No Yes Yes Yes Yes Yes  Type of Paramedic of [place];Living will - Living will;Healthcare Power of [place];Living will - [place];Living will [place];Living will  Does patient want to make changes to medical advance directive? - - No - Patient declined No - Patient declined - - No - Patient declined  Copy of [place] in Chart? No - copy requested - No - copy requested - - No - copy requested No - copy requested  Would patient like information on creating a  medical advance directive? - No - Patient declined - - - - -  Pre-existing out of facility DNR order (yellow form or pink MOST form) - - - - - - -    Current Medications (verified) Outpatient Encounter Medications as of 05/30/2021  Medication Sig   acetaminophen (TYLENOL) 500 MG tablet Take 500 mg by mouth every 6 (six) hours as needed (pain).    albuterol (VENTOLIN HFA) 108 (90 Base) MCG/ACT inhaler Inhale 2 puffs into the lungs every 6 (six) hours as needed for wheezing or shortness of breath.   amLODipine (NORVASC) 2.5 MG tablet TAKE 1 TABLET BY MOUTH EVERY DAY   atorvastatin (LIPITOR) 80 MG tablet TAKE 1 TABLET EVERY DAY   Chlorpheniramine Maleate (CHLORPHEN PO) Take by mouth.   Cholecalciferol (VITAMIN D3) 50 MCG (2000 UT) capsule Take 1 capsule (2,000 Units total) by mouth daily.   clopidogrel (PLAVIX) 75 MG tablet TAKE 1 TABLET EVERY DAY   Cyanocobalamin (B-12) 2500 MCG TABS one po qd   famotidine (PEPCID) 20 MG tablet TAKE 1 TABLET AT BEDTIME   gabapentin (NEURONTIN) 300 MG capsule TAKE 1 CAPSULE BY MOUTH THREE TIMES A DAY   Golimumab (SIMPONI ) Every other month per Dr Trudie Reed   hydrochlorothiazide (HYDRODIURIL) 12.5 MG tablet TAKE 1 TABLET BY MOUTH EVERY DAY   HYDROcodone-acetaminophen (NORCO/VICODIN) 5-325 MG tablet Take 0.5-1 tablets by mouth every 6 (six) hours as needed for severe pain.   leflunomide (ARAVA) 20 MG tablet Take 20 mg by mouth daily.    levothyroxine (SYNTHROID) 88 MCG tablet Take 1 tablet (88 mcg total) by mouth daily before breakfast.   loperamide (IMODIUM A-D) 2  MG tablet Take 2 mg by mouth 4 (four) times daily as needed for diarrhea or loose stools.   losartan (COZAAR) 50 MG tablet Take 1 tablet (50 mg total) by mouth daily.   methylPREDNISolone (MEDROL DOSEPAK) 4 MG TBPK tablet As directed   metoprolol tartrate (LOPRESSOR) 50 MG tablet TAKE 1 AND 1/2 TABLETS TWICE DAILY   pantoprazole (PROTONIX) 40 MG tablet TAKE 1 TABLET DAILY. TAKE 30 TO 60 MINUTES BEFORE  FIRST MEAL OF THE DAY   psyllium (METAMUCIL) 58.6 % packet Take 1 packet by mouth daily.   Tiotropium Bromide-Olodaterol (STIOLTO RESPIMAT) 2.5-2.5 MCG/ACT AERS INHALE 2 PUFFS BY MOUTH INTO THE LUNGS DAILY   triamcinolone (NASACORT) 55 MCG/ACT AERO nasal inhaler Place 1 spray into the nose daily.    No facility-administered encounter medications on file as of 05/30/2021.    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 Most likely scar. Mild subendocardial ischemia. EF 71% LOW RISK.    Cervical disc disease    fracture   Chronic kidney disease    COPD (chronic obstructive pulmonary disease) ([place])    Diverticulosis    Hyperlipidemia    Hypertension    Hypothyroidism (acquired)    hypo   Myocardial infarction [facility]) Aug. 2016   TIA   Polymyalgia rheumatica ([place])    2011 Dr. Charlestine Night   Rheumatoid arthritis [facility]) 2011   Dr.Truslow; fracture knees, hands and wrists -    S/P CABG x 1 1996   CABG--LIMA-LAD for ostial LAD (not felt to be PCI amenable). EF NORMAL then; LIMA now atretic   Shortness of breath dyspnea    with exertion   Stroke [facility]) 11-2014   TIA    Urinary frequency    Past Surgical History:  Procedure Laterality Date   ABDOMINAL AORTIC ANEURYSM REPAIR  6314   Complicated by mesenteric artery stenosis and splenic artery infarction with acquired Asplenia   APPENDECTOMY      BUNIONECTOMY  07/2011   right foot   CARDIAC CATHETERIZATION  2005   (Most recent CATH) - ostial LAD lesion 20-30% (down from 90% initially). Atretic LIMA. Minimal disease the RCA and Circumflex system.   CARPAL TUNNEL RELEASE Left    CATARACT EXTRACTION Bilateral    CERVICAL SPINE SURGERY     plate 2008 Dr. Saintclair Halsted   CORONARY ARTERY BYPASS GRAFT  1996   INCLUDED AN INTERNAL MAMMARY ARTERY TO THE LAD. EF WAS NORMAL   INGUINAL HERNIA REPAIR Right    LAPAROSCOPIC APPENDECTOMY N/A 06/28/2016   Procedure: APPENDECTOMY LAPAROSCOPIC;  Surgeon: Leighton Ruff, MD;  Location: [facility];  Service: General;  Laterality: N/A;   NM MYOVIEW LTD  March 2012   Fixed anteroseptal, apical and inferoapical defect with moderate size. Most likely scar. Mild subendocardial ischemia. EF 71% LOW RISK.    SPLENECTOMY  TRANSTHORACIC ECHOCARDIOGRAM  12/2014   [facility]: Normal LV size & function. EF 55-60%,    vagina polyp     VIDEO ASSISTED THORACOSCOPY (VATS)/WEDGE RESECTION Left 03/02/2015   Procedure: VIDEO ASSISTED THORACOSCOPY (VATS), MINI THORACOTOMY, LEFT UPPER LOBE WEDGE, TAKE DOWN OF INTERNAL MAMMARY LESIONS, PLACEMENT OF ON-Q PUMP;  Surgeon: Grace Isaac, MD;  Location: [place] OR;  Service: Thoracic;  Laterality: Left;   VIDEO BRONCHOSCOPY N/A 03/02/2015   Procedure: BRONCHOSCOPY;  Surgeon: Grace Isaac, MD;  Location: [place];  Service: Thoracic;  Laterality: N/A;   VIDEO BRONCHOSCOPY WITH ENDOBRONCHIAL NAVIGATION N/A 10/08/2017   Procedure: VIDEO BRONCHOSCOPY WITH ENDOBRONCHIAL NAVIGATION WITH BIOPSIES OF LEFT UPPER LOBE AND LEFT LOWER LOBE;  Surgeon: Grace Isaac, MD;  Location: [place];  Service: Thoracic;  Laterality: N/A;   Family History  Problem Relation Age of Onset   Hypertension Mother    Diabetes Mother    Heart disease Mother    Hyperlipidemia Mother    Stroke Father    Hyperlipidemia Sister    Hypertension Sister    Hypertension Daughter    Cancer Paternal Uncle         Deceased from cancer not sure of site   Hypertension Son    Hyperlipidemia Son    Hyperlipidemia Son    Hypertension Son    Hyperlipidemia Son    Hypertension Son    Hypertension Other    Coronary artery disease Other    Asthma Neg Hx    Colon cancer Neg Hx    Social History   Socioeconomic History   Marital status: Married    Spouse name: Not on file   Number of children: 4   Years of education: Not on file   Highest education level: Not on file  Occupational History   Occupation: retired    Fish farm manager: RETIRED  Tobacco Use   Smoking status: Former    Types: Cigarettes    Quit date: 12/04/1958    Years since quitting: 62.5   Smokeless tobacco: Never  Vaping Use   Vaping Use: Never used  Substance and Sexual Activity   Alcohol use: Yes    Comment: occassionally   Drug use: No   Sexual activity: Not Currently  Other Topics Concern   Not on file  Social History Narrative   [facility]  MARRIED -RETIRED ; [place].    Living at [place] since dec 2015   Social Determinants of Health   Financial Resource Strain: Low Risk    Difficulty of Paying Living Expenses: Not hard at all  Food Insecurity: No Food Insecurity   Worried About Charity fundraiser in the Last Year: Never true   Arboriculturist in the Last Year: Never true  Transportation Needs: No Transportation Needs   Lack of Transportation (Medical): No   Lack of Transportation (Non-Medical): No  Physical Activity: Insufficiently Active   Days of Exercise per Week: 3 days   Minutes of Exercise per Session: 30 min  Stress: No Stress Concern Present   Feeling of Stress : Not at all  Social Connections: Moderately Integrated   Frequency of Communication with Friends and Family: Twice a week   Frequency of Social Gatherings with Friends and Family: Twice a week   Attends Religious Services: Never   Marine scientist or Organizations: Yes   Attends Programme researcher, broadcasting/film/video: More than 4 times per year  Marital Status: Married    Tobacco Counseling Counseling given: Not Answered   Clinical Intake:  Pre-visit preparation completed: Yes  Pain : No/denies pain     Nutritional Risks: None Diabetes: No  How often do you need to have someone help you when you read instructions, pamphlets, or other written materials from your doctor or pharmacy?: 1 - Never What is the last grade level you completed in school?: college  Diabetic?no   Interpreter Needed?: No  Information entered by :: [place] of Daily Living In your present state of health, do you have any difficulty performing the following activities: 05/30/2021  Hearing? N  Vision? N  Difficulty concentrating or making decisions? N  Walking or climbing stairs? N  Dressing or bathing? N  Doing errands, shopping? N  Preparing Food and eating ? N  Using the Toilet? N  In the past six months, have you accidently leaked urine? N  Do you have problems with loss of bowel control? N  Managing your Medications? N  Managing your Finances? N  Housekeeping or managing your Housekeeping? N  Some recent data might be hidden    Patient Care Team: Plotnikov, [name], MD as PCP - General (Internal Medicine) Leonie Man, MD as PCP - Cardiology (Cardiology) Leonie Man, MD as Consulting Physician (Cardiology) Gavin Pound, MD as Consulting Physician (Rheumatology) Armbruster, Carlota Raspberry, MD as Consulting Physician (Gastroenterology) Tanda Rockers, MD as Consulting Physician (Pulmonary Disease) Grace Isaac, MD (Inactive) as Consulting Physician (Cardiothoracic Surgery) Aloha Gell, MD as Consulting Physician (Obstetrics and Gynecology) Plotnikov, [name], MD  Indicate any recent Medical Services you may have received from other than [facility] providers in the past year (date may be approximate).     Assessment:   This is a routine wellness  examination for [place].  Hearing/Vision screen Vision Screening - Comments:: Annual eye exams wears glasses  Dietary issues and exercise activities discussed: Current Exercise Habits: Home exercise routine, Type of exercise: walking, Frequency (Times/Week): 2, Intensity: Mild, Exercise limited by: None identified   Goals Addressed               This Visit's Progress     exercise (pt-stated)   On track     Would like to exercise more than she does; Moderate pool exercise 3 times a week Would like to start walking/ Coached on ways to stay motivated to walk; Takes stairs 3 flights down and down to basement; Will work on going up       [place] 2/9 Scores 05/30/2021 05/30/2021 03/15/2021 01/22/2020 01/16/2019 01/10/2018 12/21/2016  PHQ - 2 Score 0 0 0 0 0 0 0  PHQ- 9 Score - - 0 - - - 2    Fall Risk Fall Risk  05/30/2021 03/15/2021 01/22/2020 01/16/2019 01/10/2018  Falls in the past year? 0 1 0 0 No  Number falls in past yr: 0 1 0 0 -  Injury with Fall? 0 0 0 0 -  Risk for fall due to : - History of fall(s);Impaired balance/gait No Fall Risks Impaired balance/gait -  Risk for fall due to: Comment - - - - -  Follow up Falls evaluation completed - Falls evaluation completed;Education provided - -    FALL RISK PREVENTION PERTAINING TO THE HOME:  Any stairs in or around the home? No  If so, are there any without handrails? No  Home free of loose throw rugs in walkways, pet beds, electrical  cords, etc? Yes  Adequate lighting in your home to reduce risk of falls? Yes   ASSISTIVE DEVICES UTILIZED TO PREVENT FALLS:  Life alert? No  Use of a cane, walker or w/c? No  Grab bars in the bathroom? Yes  Shower chair or bench in shower? Yes  Elevated toilet seat or a handicapped toilet? Yes    Cognitive Function:Normal cognitive status assessed by direct observation by this Nurse Health Advisor. No abnormalities found.   MMSE - Mini Mental State Exam 12/21/2016 11/26/2014  Not  completed: - Unable to complete  Orientation to time 5 -  Orientation to Place 5 -  Registration 3 -  Attention/ Calculation 5 -  Recall 2 -  Language- name 2 objects 2 -  Language- repeat 1 -  Language- follow 3 step command 3 -  Language- read & follow direction 1 -  Write a sentence 1 -  Copy design 1 -  Total score 29 -     6CIT Screen 01/22/2020  What Year? 0 points  What month? 0 points  What time? 0 points  Count back from 20 0 points  Months in reverse 0 points  Repeat phrase 0 points  Total Score 0    Immunizations Immunization History  Administered Date(s) Administered   Fluad Quad(high Dose 65+) 02/03/2021   Influenza Whole 05/02/2001, 01/27/2008, 01/12/2010, 12/31/2010, 01/25/2012   Influenza, High Dose Seasonal PF 01/14/2014, 02/13/2015, 12/21/2016, 01/09/2018, 12/29/2018   Influenza,inj,Quad PF,6+ Mos 12/23/2015   Influenza-Unspecified 02/03/2013, 01/13/2014, 02/13/2015, 01/20/2020   Meningococcal Polysaccharide 03/21/2012   Moderna SARS-COV2 Booster Vaccination 03/15/2020   Moderna Sars-Covid-2 Vaccination 06/02/2019, 06/30/2019   PPD Test 11/18/2013   Pneumococcal Conjugate-13 03/20/2013   Pneumococcal Polysaccharide-23 05/02/2000, 06/23/2010, 02/26/2018   Td 05/01/2006   Tdap 06/01/2016   Zoster Recombinat (Shingrix) 05/16/2017, 07/16/2017   Zoster, Live 01/17/2006    TDAP status: Up to date  Flu Vaccine status: Up to date  Pneumococcal vaccine status: Up to date  Covid-19 vaccine status: Completed vaccines  Qualifies for Shingles Vaccine? Yes   Zostavax completed Yes   Shingrix Completed?: Yes  Screening Tests Health Maintenance  Topic Date Due   COLONOSCOPY (Pts 45-81yrs Insurance coverage will need to be confirmed)  03/30/2019   COVID-19 Vaccine (3 - Moderna risk series) 04/12/2020   TETANUS/TDAP  06/01/2026   Pneumonia Vaccine 31+ Years old  Completed   INFLUENZA VACCINE  Completed   DEXA SCAN  Completed   Zoster Vaccines- Shingrix   Completed   HPV VACCINES  Aged Out    Health Maintenance  Health Maintenance Due  Topic Date Due   COLONOSCOPY (Pts 45-75yrs Insurance coverage will need to be confirmed)  03/30/2019   COVID-19 Vaccine (3 - Moderna risk series) 04/12/2020    Colorectal cancer screening: No longer required.   Mammogram status: No longer required due to age.  Bone Density status: Completed 10/23/2017. Results reflect: Bone density results: OSTEOPENIA. Repeat every 5 years.  Lung Cancer Screening: (Low Dose CT Chest recommended if Age 78-80 years, 30 pack-year currently smoking OR have quit w/in 15years.) does not qualify.   Lung Cancer Screening Referral: n/a  Additional Screening:  Hepatitis C Screening: does not qualify;   Vision Screening: Recommended annual ophthalmology exams for early detection of glaucoma and other disorders of the eye. Is the patient up to date with their annual eye exam?  Yes  Who is the provider or what is the name of the office in which the patient attends annual  eye exams? Dr.Lyles  If pt is not established with a provider, would they like to be referred to a provider to establish care? No .   Dental Screening: Recommended annual dental exams for proper oral hygiene  Community Resource Referral / Chronic Care Management: CRR required this visit?  No   CCM required this visit?  No      Plan:     I have personally reviewed and noted the following in the patients chart:   Medical and social history Use of alcohol, tobacco or illicit drugs  Current medications and supplements including opioid prescriptions.  Functional ability and status Nutritional status Physical activity Advanced directives List of other physicians Hospitalizations, surgeries, and ER visits in previous 12 months Vitals Screenings to include cognitive, depression, and falls Referrals and appointments  In addition, I have reviewed and discussed with patient certain preventive  protocols, quality metrics, and best practice recommendations. A written personalized care plan for preventive services as well as general preventive health recommendations were provided to patient.     Randel Pigg, LPN   08/02/5246   Nurse Notes: none    Medical screening examination/treatment/procedure(s) were performed by non-physician practitioner and as supervising physician I was immediately available for consultation/collaboration.  I agree with above. Lew Dawes, MD

## 2021-05-30 NOTE — Patient Instructions (Signed)
[name] , Thank you for taking time to come for your Medicare Wellness Visit. I appreciate your ongoing commitment to your health goals. Please review the following plan we discussed and let me know if I can assist you in the future.   Screening recommendations/referrals: Colonoscopy: no ,longer required Mammogram: no longer required  Bone Density: 10/23/2017 Recommended yearly ophthalmology/optometry visit for glaucoma screening and checkup Recommended yearly dental visit for hygiene and checkup  Vaccinations: Influenza vaccine: completed  Pneumococcal vaccine: completed  Tdap vaccine: 06/01/2016 Shingles vaccine: completed     Advanced directives: yes   Conditions/risks identified: none   Next appointment: none    Preventive Care 1 Years and Older, Female Preventive care refers to lifestyle choices and visits with your health care provider that can promote health and wellness. What does preventive care include? A yearly physical exam. This is also called an annual well check. Dental exams once or twice a year. Routine eye exams. Ask your health care provider how often you should have your eyes checked. Personal lifestyle choices, including: Daily care of your teeth and gums. Regular physical activity. Eating a healthy diet. Avoiding tobacco and drug use. Limiting alcohol use. Practicing safe sex. Taking low-dose aspirin every day. Taking vitamin and mineral supplements as recommended by your health care provider. What happens during an annual well check? The services and screenings done by your health care provider during your annual well check will depend on your age, overall health, lifestyle risk factors, and family history of disease. Counseling  Your health care provider may ask you questions about your: Alcohol use. Tobacco use. Drug use. Emotional well-being. Home and relationship well-being. Sexual activity. Eating habits. History of falls. Memory and  ability to understand (cognition). Work and work Statistician. Reproductive health. Screening  You may have the following tests or measurements: Height, weight, and BMI. Blood pressure. Lipid and cholesterol levels. These may be checked every 5 years, or more frequently if you are over 30 years old. Skin check. Lung cancer screening. You may have this screening every year starting at age 30 if you have a 30-pack-year history of smoking and currently smoke or have quit within the past 15 years. Fecal occult blood test (FOBT) of the stool. You may have this test every year starting at age 31. Flexible sigmoidoscopy or colonoscopy. You may have a sigmoidoscopy every 5 years or a colonoscopy every 10 years starting at age 48. Hepatitis C blood test. Hepatitis B blood test. Sexually transmitted disease (STD) testing. Diabetes screening. This is done by checking your blood sugar (glucose) after you have not eaten for a while (fasting). You may have this done every 1-3 years. Bone density scan. This is done to screen for osteoporosis. You may have this done starting at age 27. Mammogram. This may be done every 1-2 years. Talk to your health care provider about how often you should have regular mammograms. Talk with your health care provider about your test results, treatment options, and if necessary, the need for more tests. Vaccines  Your health care provider may recommend certain vaccines, such as: Influenza vaccine. This is recommended every year. Tetanus, diphtheria, and acellular pertussis (Tdap, Td) vaccine. You may need a Td booster every 10 years. Zoster vaccine. You may need this after age 50. Pneumococcal 13-valent conjugate (PCV13) vaccine. One dose is recommended after age 83. Pneumococcal polysaccharide (PPSV23) vaccine. One dose is recommended after age 24. Talk to your health care provider about which screenings and vaccines you  need and how often you need them. This information is  not intended to replace advice given to you by your health care provider. Make sure you discuss any questions you have with your health care provider. Document Released: 05/14/2015 Document Revised: 01/05/2016 Document Reviewed: 02/16/2015 Elsevier Interactive Patient Education  2017 [place] Prevention in the Home Falls can cause injuries. They can happen to people of all ages. There are many things you can do to make your home safe and to help prevent falls. What can I do on the outside of my home? Regularly fix the edges of walkways and driveways and fix any cracks. Remove anything that might make you trip as you walk through a door, such as a raised step or threshold. Trim any bushes or trees on the path to your home. Use bright outdoor lighting. Clear any walking paths of anything that might make someone trip, such as rocks or tools. Regularly check to see if handrails are loose or broken. Make sure that both sides of any steps have handrails. Any raised decks and porches should have guardrails on the edges. Have any leaves, snow, or ice cleared regularly. Use sand or salt on walking paths during winter. Clean up any spills in your garage right away. This includes oil or grease spills. What can I do in the bathroom? Use night lights. Install grab bars by the toilet and in the tub and shower. Do not use towel bars as grab bars. Use non-skid mats or decals in the tub or shower. If you need to sit down in the shower, use a plastic, non-slip stool. Keep the floor dry. Clean up any water that spills on the floor as soon as it happens. Remove soap buildup in the tub or shower regularly. Attach bath mats securely with double-sided non-slip rug tape. Do not have throw rugs and other things on the floor that can make you trip. What can I do in the bedroom? Use night lights. Make sure that you have a light by your bed that is easy to reach. Do not use any sheets or blankets that  are too big for your bed. They should not hang down onto the floor. Have a firm chair that has side arms. You can use this for support while you get dressed. Do not have throw rugs and other things on the floor that can make you trip. What can I do in the kitchen? Clean up any spills right away. Avoid walking on wet floors. Keep items that you use a lot in easy-to-reach places. If you need to reach something above you, use a strong step stool that has a grab bar. Keep electrical cords out of the way. Do not use floor polish or wax that makes floors slippery. If you must use wax, use non-skid floor wax. Do not have throw rugs and other things on the floor that can make you trip. What can I do with my stairs? Do not leave any items on the stairs. Make sure that there are handrails on both sides of the stairs and use them. Fix handrails that are broken or loose. Make sure that handrails are as long as the stairways. Check any carpeting to make sure that it is firmly attached to the stairs. Fix any carpet that is loose or worn. Avoid having throw rugs at the top or bottom of the stairs. If you do have throw rugs, attach them to the floor with carpet tape. Make sure that  you have a light switch at the top of the stairs and the bottom of the stairs. If you do not have them, ask someone to add them for you. What else can I do to help prevent falls? Wear shoes that: Do not have high heels. Have rubber bottoms. Are comfortable and fit you well. Are closed at the toe. Do not wear sandals. If you use a stepladder: Make sure that it is fully opened. Do not climb a closed stepladder. Make sure that both sides of the stepladder are locked into place. Ask someone to hold it for you, if possible. Clearly mark and make sure that you can see: Any grab bars or handrails. First and last steps. Where the edge of each step is. Use tools that help you move around (mobility aids) if they are needed. These  include: Canes. Walkers. Scooters. Crutches. Turn on the lights when you go into a dark area. Replace any light bulbs as soon as they burn out. Set up your furniture so you have a clear path. Avoid moving your furniture around. If any of your floors are uneven, fix them. If there are any pets around you, be aware of where they are. Review your medicines with your doctor. Some medicines can make you feel dizzy. This can increase your chance of falling. Ask your doctor what other things that you can do to help prevent falls. This information is not intended to replace advice given to you by your health care provider. Make sure you discuss any questions you have with your health care provider. Document Released: 02/11/2009 Document Revised: 09/23/2015 Document Reviewed: 05/22/2014 Elsevier Interactive Patient Education  2017 [employer].

## 2021-06-02 ENCOUNTER — Other Ambulatory Visit: Payer: Self-pay

## 2021-06-02 ENCOUNTER — Encounter: Payer: Self-pay | Admitting: Internal Medicine

## 2021-06-02 ENCOUNTER — Other Ambulatory Visit: Payer: Self-pay | Admitting: Internal Medicine

## 2021-06-02 MED ORDER — LOSARTAN POTASSIUM 50 MG PO TABS: 50.0000 mg | ORAL_TABLET | Freq: Every day | ORAL | 3 refills | Status: DC

## 2021-06-02 MED ORDER — GABAPENTIN 300 MG PO CAPS: ORAL_CAPSULE | ORAL | 1 refills | Status: DC

## 2021-06-02 MED ORDER — CLOPIDOGREL BISULFATE 75 MG PO TABS: 75.0000 mg | ORAL_TABLET | Freq: Every day | ORAL | 3 refills | Status: DC

## 2021-06-07 DIAGNOSIS — Z85828 Personal history of other malignant neoplasm of skin: Secondary | ICD-10-CM | POA: Diagnosis not present

## 2021-06-07 DIAGNOSIS — C44311 Basal cell carcinoma of skin of nose: Secondary | ICD-10-CM | POA: Diagnosis not present

## 2021-06-21 ENCOUNTER — Ambulatory Visit: Payer: Medicare PPO | Admitting: Internal Medicine

## 2021-06-21 ENCOUNTER — Encounter: Payer: Self-pay | Admitting: Internal Medicine

## 2021-06-21 ENCOUNTER — Other Ambulatory Visit: Payer: Self-pay

## 2021-06-21 VITALS — BP 128/90 | HR 71 | Temp 97.8°F | Resp 18 | Ht 63.0 in | Wt 155.8 lb

## 2021-06-21 DIAGNOSIS — N183 Chronic kidney disease, stage 3 unspecified: Secondary | ICD-10-CM

## 2021-06-21 DIAGNOSIS — G459 Transient cerebral ischemic attack, unspecified: Secondary | ICD-10-CM

## 2021-06-21 DIAGNOSIS — R269 Unspecified abnormalities of gait and mobility: Secondary | ICD-10-CM

## 2021-06-21 DIAGNOSIS — M069 Rheumatoid arthritis, unspecified: Secondary | ICD-10-CM

## 2021-06-21 DIAGNOSIS — E785 Hyperlipidemia, unspecified: Secondary | ICD-10-CM | POA: Diagnosis not present

## 2021-06-21 DIAGNOSIS — I10 Essential (primary) hypertension: Secondary | ICD-10-CM | POA: Diagnosis not present

## 2021-06-21 LAB — COMPREHENSIVE METABOLIC PANEL
ALT: 17 U/L (ref 0–35)
AST: 26 U/L (ref 0–37)
Albumin: 4.1 g/dL (ref 3.5–5.2)
Alkaline Phosphatase: 51 U/L (ref 39–117)
BUN: 49 mg/dL — ABNORMAL HIGH (ref 6–23)
CO2: 31 mEq/L (ref 19–32)
Calcium: 9.8 mg/dL (ref 8.4–10.5)
Chloride: 102 mEq/L (ref 96–112)
Creatinine, Ser: 2.07 mg/dL — ABNORMAL HIGH (ref 0.40–1.20)
GFR: 21.84 mL/min — ABNORMAL LOW (ref >60.00)
Glucose, Bld: 104 mg/dL — ABNORMAL HIGH (ref 70–99)
Potassium: 4.5 mEq/L (ref 3.5–5.1)
Sodium: 140 mEq/L (ref 135–145)
Total Bilirubin: 0.5 mg/dL (ref 0.2–1.2)
Total Protein: 6.5 g/dL (ref 6.0–8.3)

## 2021-06-21 LAB — TSH: TSH: 2.35 u[IU]/mL (ref 0.35–5.50)

## 2021-06-21 NOTE — Assessment & Plan Note (Signed)
Use a cane, walker

## 2021-06-21 NOTE — Assessment & Plan Note (Signed)
No relapse On Lipitor, Plavix

## 2021-06-21 NOTE — Assessment & Plan Note (Signed)
On Arava 

## 2021-06-21 NOTE — Assessment & Plan Note (Signed)
Continue to monitor GFR. Cont w/good hydration

## 2021-06-21 NOTE — Assessment & Plan Note (Signed)
BP Readings from Last 3 Encounters:  06/21/21 128/90  03/22/21 130/74  03/15/21 (!) 142/80

## 2021-06-21 NOTE — Progress Notes (Signed)
Subjective:  Patient ID: [name], female    DOB: Oct 01, 1940  Age: 81 y.o. MRN: [id number]  CC: 3 month f/u (No concerns. )   HPI [name] presents for HTN, CAD, RA  Outpatient Medications Prior to Visit  Medication Sig Dispense Refill   acetaminophen (TYLENOL) 500 MG tablet Take 500 mg by mouth every 6 (six) hours as needed (pain).      albuterol (VENTOLIN HFA) 108 (90 Base) MCG/ACT inhaler Inhale 2 puffs into the lungs every 6 (six) hours as needed for wheezing or shortness of breath. 8 g 2   amLODipine (NORVASC) 2.5 MG tablet TAKE 1 TABLET BY MOUTH EVERY DAY 90 tablet 3   atorvastatin (LIPITOR) 80 MG tablet TAKE 1 TABLET EVERY DAY 90 tablet 2   Chlorpheniramine Maleate (CHLORPHEN PO) Take by mouth.     Cholecalciferol (VITAMIN D3) 50 MCG (2000 UT) capsule Take 1 capsule (2,000 Units total) by mouth daily. 100 capsule 3   clopidogrel (PLAVIX) 75 MG tablet TAKE 1 TABLET EVERY DAY 90 tablet 1   Cyanocobalamin (B-12) 2500 MCG TABS one po qd 100 tablet 3   famotidine (PEPCID) 20 MG tablet TAKE 1 TABLET AT BEDTIME 90 tablet 3   gabapentin (NEURONTIN) 300 MG capsule TAKE 1 CAPSULE BY MOUTH THREE TIMES A DAY 270 capsule 1   Golimumab (SIMPONI [place]) Every other month per Dr Trudie Reed     hydrochlorothiazide (HYDRODIURIL) 12.5 MG tablet TAKE 1 TABLET BY MOUTH EVERY DAY 90 tablet 3   HYDROcodone-acetaminophen (NORCO/VICODIN) 5-325 MG tablet Take 0.5-1 tablets by mouth every 6 (six) hours as needed for severe pain. 20 tablet 0   leflunomide (ARAVA) 20 MG tablet Take 20 mg by mouth daily.      levothyroxine (SYNTHROID) 88 MCG tablet Take 1 tablet (88 mcg total) by mouth daily before breakfast. 90 tablet 3   loperamide (IMODIUM A-D) 2 MG tablet Take 2 mg by mouth 4 (four) times daily as needed for diarrhea or loose stools.     losartan (COZAAR) 50 MG tablet TAKE 1 TABLET EVERY DAY (STOP LOSARTAN 100 MG) 90 tablet 1   methylPREDNISolone (MEDROL DOSEPAK) 4 MG TBPK tablet As directed 21 tablet  1   metoprolol tartrate (LOPRESSOR) 50 MG tablet TAKE 1 AND 1/2 TABLETS TWICE DAILY 270 tablet 3   pantoprazole (PROTONIX) 40 MG tablet TAKE 1 TABLET DAILY. TAKE 30 TO 60 MINUTES BEFORE FIRST MEAL OF THE DAY 90 tablet 3   psyllium (METAMUCIL) 58.6 % packet Take 1 packet by mouth daily.     Tiotropium Bromide-Olodaterol (STIOLTO RESPIMAT) 2.5-2.5 MCG/ACT AERS INHALE 2 PUFFS BY MOUTH INTO THE LUNGS DAILY 4 g 11   triamcinolone (NASACORT) 55 MCG/ACT AERO nasal inhaler Place 1 spray into the nose daily.      No facility-administered medications prior to visit.    ROS: Review of Systems  Constitutional:  Negative for activity change, appetite change, chills, fatigue and unexpected weight change.  HENT:  Negative for congestion, mouth sores and sinus pressure.   Eyes:  Negative for visual disturbance.  Respiratory:  Negative for cough and chest tightness.   Gastrointestinal:  Negative for abdominal pain and nausea.  Genitourinary:  Negative for difficulty urinating, frequency and vaginal pain.  Musculoskeletal:  Positive for gait problem. Negative for back pain.  Skin:  Negative for pallor and rash.  Neurological:  Negative for dizziness, tremors, weakness, numbness and headaches.  Psychiatric/Behavioral:  Negative for confusion, sleep disturbance and suicidal ideas. The  patient is not nervous/anxious.    Objective:  BP 128/90    Pulse 71    Temp 97.8 F (36.6 C) (Oral)    Resp 18    Ht 5\' 3"  (1.6 m)    Wt 155 lb 12.8 oz (70.7 kg)    SpO2 95%    BMI 27.60 kg/m   BP Readings from Last 3 Encounters:  06/21/21 128/90  03/22/21 130/74  03/15/21 (!) 142/80    Wt Readings from Last 3 Encounters:  06/21/21 155 lb 12.8 oz (70.7 kg)  03/22/21 156 lb (70.8 kg)  03/15/21 156 lb 6.4 oz (70.9 kg)    Physical Exam Constitutional:      General: She is not in acute distress.    Appearance: She is well-developed.  HENT:     Head: Normocephalic.     Right Ear: External ear normal.     Left  Ear: External ear normal.     Nose: Nose normal.  Eyes:     General:        Right eye: No discharge.        Left eye: No discharge.     Conjunctiva/sclera: Conjunctivae normal.     Pupils: Pupils are equal, round, and reactive to light.  Neck:     Thyroid: No thyromegaly.     Vascular: No JVD.     Trachea: No tracheal deviation.  Cardiovascular:     Rate and Rhythm: Normal rate and regular rhythm.     Heart sounds: Normal heart sounds.  Pulmonary:     Effort: No respiratory distress.     Breath sounds: No stridor. No wheezing.  Abdominal:     General: Bowel sounds are normal. There is no distension.     Palpations: Abdomen is soft. There is no mass.     Tenderness: There is no abdominal tenderness. There is no guarding or rebound.  Musculoskeletal:        General: Tenderness present.     Cervical back: Normal range of motion and neck supple. No rigidity.  Lymphadenopathy:     Cervical: No cervical adenopathy.  Skin:    Findings: No erythema or rash.  Neurological:     Mental Status: She is oriented to person, place, and time.     Cranial Nerves: No cranial nerve deficit.     Motor: No abnormal muscle tone.     Coordination: Coordination abnormal.     Gait: Gait abnormal.     Deep Tendon Reflexes: Reflexes normal.  Psychiatric:        Behavior: Behavior normal.        Thought Content: Thought content normal.        Judgment: Judgment normal.    Lab Results  Component Value Date   WBC 7.5 09/07/2020   HGB 12.1 09/07/2020   HCT 36.7 09/07/2020   PLT 314.0 09/07/2020   GLUCOSE 86 12/09/2020   CHOL 157 09/07/2020   TRIG 121.0 09/07/2020   HDL 59.30 09/07/2020   LDLDIRECT 162.1 10/28/2009   LDLCALC 74 09/07/2020   ALT 20 09/07/2020   AST 23 09/07/2020   NA 140 12/09/2020   K 4.7 12/09/2020   CL 103 12/09/2020   CREATININE 2.18 (H) 12/09/2020   BUN 60 (H) 12/09/2020   CO2 29 12/09/2020   TSH 2.82 09/07/2020   INR 0.89 05/20/2018    CT CHEST WO  CONTRAST  Result Date: 03/17/2021 CLINICAL DATA:  Follow-up right lower lobe pulmonary nodule, status post left  upper lobe wedge resection EXAM: CT CHEST WITHOUT CONTRAST TECHNIQUE: Multidetector CT imaging of the chest was performed following the standard protocol without IV contrast. COMPARISON:  03/18/2020 FINDINGS: Cardiovascular: Aortic atherosclerosis. Cardiomegaly. Left and right coronary artery calcifications. No pericardial effusion. Mediastinum/Nodes: No enlarged mediastinal, hilar, or axillary lymph nodes. Thyroid gland, trachea, and esophagus demonstrate no significant findings. Lungs/Pleura: Unchanged 0.6 cm subpleural nodule of the peripheral right lower lobe (series 8, image 117). Unchanged postoperative findings of medial left upper lobe wedge resection (series 8, image 36). Bandlike scarring of the bilateral lung bases. No pleural effusion or pneumothorax. Upper Abdomen: No acute abnormality. Musculoskeletal: No chest wall mass or suspicious bone lesions identified. IMPRESSION: 1. Unchanged 0.6 cm subpleural nodule of the peripheral right lower lobe, almost certainly benign given established stability. No further follow-up is specifically indicated for this nodule. Continued follow-up as indicated by clinical oncology surveillance protocol given history of lung cancer. 2. Unchanged postoperative findings of medial left upper lobe wedge resection. No evidence of malignant recurrence. 3. Coronary artery disease. Aortic Atherosclerosis (ICD10-I70.0). Electronically Signed   By: Delanna Ahmadi M.D.   On: 03/17/2021 11:31    Assessment & Plan:   Problem List Items Addressed This Visit     Essential hypertension (Chronic)    BP Readings from Last 3 Encounters:  06/21/21 128/90  03/22/21 130/74  03/15/21 (!) 142/80        Relevant Orders   Comprehensive metabolic panel   TSH   CRI (chronic renal insufficiency), stage 3 (moderate) (HCC)    Continue to monitor GFR. Cont w/good  hydration      Dyslipidemia, goal LDL below 70 - Primary   Relevant Orders   TSH   Gait disorder    Use a cane, walker      Rheumatoid arthritis ([place])    On Arava      TIA (transient ischemic attack)    No relapse On Lipitor, Plavix         No orders of the defined types were placed in this encounter.     Follow-up: Return in about 6 months (around 12/19/2021) for Wellness Exam.  [name], MD

## 2021-07-06 DIAGNOSIS — M0579 Rheumatoid arthritis with rheumatoid factor of multiple sites without organ or systems involvement: Secondary | ICD-10-CM | POA: Diagnosis not present

## 2021-07-06 DIAGNOSIS — Z79899 Other long term (current) drug therapy: Secondary | ICD-10-CM | POA: Diagnosis not present

## 2021-07-08 DIAGNOSIS — M79671 Pain in right foot: Secondary | ICD-10-CM | POA: Diagnosis not present

## 2021-07-08 DIAGNOSIS — L84 Corns and callosities: Secondary | ICD-10-CM | POA: Diagnosis not present

## 2021-07-08 DIAGNOSIS — M79672 Pain in left foot: Secondary | ICD-10-CM | POA: Diagnosis not present

## 2021-07-08 DIAGNOSIS — L602 Onychogryphosis: Secondary | ICD-10-CM | POA: Diagnosis not present

## 2021-07-11 DIAGNOSIS — M545 Low back pain, unspecified: Secondary | ICD-10-CM | POA: Diagnosis not present

## 2021-07-11 DIAGNOSIS — Z6827 Body mass index (BMI) 27.0-27.9, adult: Secondary | ICD-10-CM | POA: Diagnosis not present

## 2021-07-11 DIAGNOSIS — M0579 Rheumatoid arthritis with rheumatoid factor of multiple sites without organ or systems involvement: Secondary | ICD-10-CM | POA: Diagnosis not present

## 2021-07-11 DIAGNOSIS — E663 Overweight: Secondary | ICD-10-CM | POA: Diagnosis not present

## 2021-07-11 DIAGNOSIS — Z79899 Other long term (current) drug therapy: Secondary | ICD-10-CM | POA: Diagnosis not present

## 2021-07-11 DIAGNOSIS — M1991 Primary osteoarthritis, unspecified site: Secondary | ICD-10-CM | POA: Diagnosis not present

## 2021-07-23 ENCOUNTER — Encounter: Payer: Self-pay | Admitting: Internal Medicine

## 2021-07-23 ENCOUNTER — Other Ambulatory Visit: Payer: Self-pay | Admitting: Internal Medicine

## 2021-07-23 DIAGNOSIS — R058 Other specified cough: Secondary | ICD-10-CM

## 2021-09-06 DIAGNOSIS — Z79899 Other long term (current) drug therapy: Secondary | ICD-10-CM | POA: Diagnosis not present

## 2021-09-06 DIAGNOSIS — M0579 Rheumatoid arthritis with rheumatoid factor of multiple sites without organ or systems involvement: Secondary | ICD-10-CM | POA: Diagnosis not present

## 2021-09-06 DIAGNOSIS — R5383 Other fatigue: Secondary | ICD-10-CM | POA: Diagnosis not present

## 2021-09-06 DIAGNOSIS — Z111 Encounter for screening for respiratory tuberculosis: Secondary | ICD-10-CM | POA: Diagnosis not present

## 2021-09-16 DIAGNOSIS — L84 Corns and callosities: Secondary | ICD-10-CM | POA: Diagnosis not present

## 2021-09-16 DIAGNOSIS — L602 Onychogryphosis: Secondary | ICD-10-CM | POA: Diagnosis not present

## 2021-09-16 DIAGNOSIS — M79671 Pain in right foot: Secondary | ICD-10-CM | POA: Diagnosis not present

## 2021-09-16 DIAGNOSIS — M79672 Pain in left foot: Secondary | ICD-10-CM | POA: Diagnosis not present

## 2021-09-22 ENCOUNTER — Telehealth: Payer: Self-pay | Admitting: Internal Medicine

## 2021-09-22 DIAGNOSIS — M25552 Pain in left hip: Secondary | ICD-10-CM

## 2021-09-22 NOTE — Telephone Encounter (Signed)
Pt called in and is requesting a referral to an orthopedist.   States she has had hip problems for a while now.   Requesting callback from assistant ASAP.

## 2021-09-23 NOTE — Telephone Encounter (Signed)
Ok. Done. Ref to Dr Phoebe Sharps office Thx

## 2021-09-29 ENCOUNTER — Other Ambulatory Visit: Payer: Self-pay | Admitting: *Deleted

## 2021-09-29 MED ORDER — AMLODIPINE BESYLATE 2.5 MG PO TABS: 2.5000 mg | ORAL_TABLET | Freq: Every day | ORAL | 2 refills | Status: DC

## 2021-10-06 ENCOUNTER — Ambulatory Visit (INDEPENDENT_AMBULATORY_CARE_PROVIDER_SITE_OTHER): Payer: Medicare PPO

## 2021-10-06 ENCOUNTER — Ambulatory Visit: Payer: Medicare PPO | Admitting: Orthopaedic Surgery

## 2021-10-06 ENCOUNTER — Encounter: Payer: Self-pay | Admitting: Orthopaedic Surgery

## 2021-10-06 DIAGNOSIS — M1612 Unilateral primary osteoarthritis, left hip: Secondary | ICD-10-CM | POA: Diagnosis not present

## 2021-10-06 DIAGNOSIS — M25552 Pain in left hip: Secondary | ICD-10-CM | POA: Diagnosis not present

## 2021-10-06 NOTE — Progress Notes (Signed)
Office Visit Note   Patient: [name]           Date of Birth: 21-Oct-1940           MRN: [id number] Visit Date: 10/06/2021              Requested by: Cassandria Anger, MD [place],  [place] [address] PCP: Plotnikov, [name], MD   Assessment & Plan: Visit Diagnoses:  1. Primary osteoarthritis of left hip     Plan: Impression is advanced left hip degenerative joint disease.  Bone-on-bone joint space narrowing.  Given the severity of the condition and the fact that her ability to perform ADLs is severely limited she has elected to move forward with a left total hip replacement.  Unfortunately conservative management has been unsuccessful.  Risk benefits prognosis reviewed with the patient detail.  We will get necessary preoperative medical and cardiac clearances.  She will need to stop Plavix 7 days in advance.  Due to her age I think it would be beneficial to send her to outpatient PT for prehab.  Follow-Up Instructions: No follow-ups on file.   Orders:  Orders Placed This Encounter  Procedures   XR HIP UNILAT W OR W/O PELVIS 2-3 VIEWS LEFT   Ambulatory referral to Physical Therapy   No orders of the defined types were placed in this encounter.     Procedures: No procedures performed   Clinical Data: No additional findings.   Subjective: Chief Complaint  Patient presents with   Left Hip - Pain    HPI [name] is a very pleasant 81 year old female here for evaluation of chronic severe left hip and groin pain for about a year and a half.  She has had to use a rolling walker and cane as a result.  She has severe pain with daily activities.  She has had 2 hip injections at her PCPs office without any relief.  Tylenol does not provide significant relief.  Cannot take NSAIDs due to renal insufficiency and being on Plavix for history of CABG.  Review of Systems  Constitutional: Negative.   HENT: Negative.    Eyes: Negative.   Respiratory:  Negative.    Cardiovascular: Negative.   Endocrine: Negative.   Musculoskeletal: Negative.   Neurological: Negative.   Hematological: Negative.   Psychiatric/Behavioral: Negative.    All other systems reviewed and are negative.    Objective: Vital Signs: There were no vitals taken for this visit.  Physical Exam Vitals and nursing note reviewed.  Constitutional:      Appearance: She is well-developed.  HENT:     Head: Normocephalic and atraumatic.  Pulmonary:     Effort: Pulmonary effort is normal.  Abdominal:     Palpations: Abdomen is soft.  Musculoskeletal:     Cervical back: Neck supple.  Skin:    General: Skin is warm.     Capillary Refill: Capillary refill takes less than 2 seconds.  Neurological:     Mental Status: She is alert and oriented to person, place, and time.  Psychiatric:        Behavior: Behavior normal.        Thought Content: Thought content normal.        Judgment: Judgment normal.     Ortho Exam Examination of left hip shows pain with straight leg raise and hip flexion and internal rotation.  Lateral hip is nontender.  No sciatic tension signs.  Pain with circumduction of the  hip.  Antalgic gait. Specialty Comments:  No specialty comments available.  Imaging: XR HIP UNILAT W OR W/O PELVIS 2-3 VIEWS LEFT  Result Date: 10/06/2021 Advanced degenerative joint disease with bone-on-bone joint space narrowing.    PMFS History: Patient Active Problem List   Diagnosis Date Noted   Primary osteoarthritis of left hip 10/06/2021   Left hip pain 12/09/2020   Aortic atherosclerosis ([place]) 09/07/2020   Gait disorder 06/08/2020   Neoplasm of uncertain behavior of skin 03/14/2020   Contusion of face 03/10/2020   Arthralgia 12/25/2019   Ankle swelling 08/08/2019   Hyperkalemia 05/15/2019   Osteoporosis 02/26/2018   Colon polyp 06/28/2016   COPD GOLD I 05/28/2016   Upper airway cough syndrome 05/22/2016   Acute respiratory failure with hypoxemia ([place])  04/16/2016   Acute respiratory failure with hypoxia ([facility]) 04/15/2016   Acute bronchitis 04/03/2016   Wrist pain, right 12/14/2015   Lung mass 03/02/2015   Pre-operative cardiovascular examination 02/21/2015   DOE (dyspnea on exertion) 02/16/2015   Solitary pulmonary nodule 02/15/2015   TIA (transient ischemic attack) 12/31/2014   Diarrhea 11/26/2014   Well adult exam 11/26/2014   Seasonal allergies 12/31/2013   Splenic artery aneurysm ([place]) 04/07/2013   Cerumen impaction 03/20/2013   Dyslipidemia, goal LDL below 70    Pernicious anemia 09/04/2012   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   Long term (current) use of anticoagulants 11/08/2010   Rheumatoid arthritis ([place]) 06/23/2010   Pain in joint 11/26/2009   ROSACEA 03/23/2008   Hypothyroidism 12/08/2006   MACULAR DEGENERATION 12/08/2006   Essential hypertension 12/08/2006   Diaphragmatic hernia 12/08/2006   CRI (chronic renal insufficiency), stage 3 (moderate) ([place]) 12/08/2006   ASPLENIA 12/08/2006   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) 12/22/1994    Class: History of   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; now atretic and significantly improved LAD lesion without intervention 12/22/1994    Class: History of   Past Medical History:  Diagnosis Date   Abdominal aortic aneurysm ([place])    REPAIRED IN 1996 BY [name]  AND HAS RECENTLY BEEN FOLLOWED BY [name]   Acquired asplenia     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 Most likely scar. Mild subendocardial ischemia. EF 71% LOW RISK.    Cervical disc disease     fracture   Chronic kidney disease    COPD (chronic obstructive pulmonary disease) ([place])    Diverticulosis    Hyperlipidemia    Hypertension    Hypothyroidism (acquired)    hypo   Myocardial infarction [facility]) Aug. 2016   TIA   Polymyalgia rheumatica ([place])    2011 Dr. Charlestine Night   Rheumatoid arthritis [facility]) 2011   Dr.Truslow; fracture knees, hands and wrists -    S/P CABG x 1 1996   CABG--LIMA-LAD for ostial LAD (not felt to be PCI amenable). EF NORMAL then; LIMA now atretic   Shortness of breath dyspnea    with exertion   Stroke [facility]) [phone]   TIA    Urinary frequency  Family History  Problem Relation Age of Onset   Hypertension Mother    Diabetes Mother    Heart disease Mother    Hyperlipidemia Mother    Stroke Father    Hyperlipidemia Sister    Hypertension Sister    Hypertension Daughter    Cancer Paternal Uncle        Deceased from cancer not sure of site   Hypertension Son    Hyperlipidemia Son    Hyperlipidemia Son    Hypertension Son    Hyperlipidemia Son    Hypertension Son    Hypertension Other    Coronary artery disease Other    Asthma Neg Hx    Colon cancer Neg Hx     Past Surgical History:  Procedure Laterality Date   ABDOMINAL AORTIC ANEURYSM REPAIR  9323   Complicated by mesenteric artery stenosis and splenic artery infarction with acquired Asplenia   APPENDECTOMY     BUNIONECTOMY  07/2011   right foot   CARDIAC CATHETERIZATION  2005   (Most recent CATH) - ostial LAD lesion 20-30% (down from 90% initially). Atretic LIMA. Minimal disease the RCA and Circumflex system.   CARPAL TUNNEL RELEASE Left    CATARACT EXTRACTION Bilateral    CERVICAL SPINE SURGERY     plate 2008 Dr. Saintclair Halsted   CORONARY ARTERY BYPASS GRAFT  1996   INCLUDED AN INTERNAL MAMMARY ARTERY TO THE LAD. EF WAS NORMAL   INGUINAL HERNIA REPAIR Right    LAPAROSCOPIC APPENDECTOMY N/A 06/28/2016   Procedure: APPENDECTOMY LAPAROSCOPIC;  Surgeon: Leighton Ruff, MD;  Location: [facility];   Service: General;  Laterality: N/A;   NM MYOVIEW LTD  March 2012   Fixed anteroseptal, apical and inferoapical defect with moderate size. Most likely scar. Mild subendocardial ischemia. EF 71% LOW RISK.    SPLENECTOMY     TRANSTHORACIC ECHOCARDIOGRAM  12/2014   [facility]: Normal LV size & function. EF 55-60%,    vagina polyp     VIDEO ASSISTED THORACOSCOPY (VATS)/WEDGE RESECTION Left 03/02/2015   Procedure: VIDEO ASSISTED THORACOSCOPY (VATS), MINI THORACOTOMY, LEFT UPPER LOBE WEDGE, TAKE DOWN OF INTERNAL MAMMARY LESIONS, PLACEMENT OF ON-Q PUMP;  Surgeon: Grace Isaac, MD;  Location: [place];  Service: Thoracic;  Laterality: Left;   VIDEO BRONCHOSCOPY N/A 03/02/2015   Procedure: BRONCHOSCOPY;  Surgeon: Grace Isaac, MD;  Location: [place];  Service: Thoracic;  Laterality: N/A;   VIDEO BRONCHOSCOPY WITH ENDOBRONCHIAL NAVIGATION N/A 10/08/2017   Procedure: VIDEO BRONCHOSCOPY WITH ENDOBRONCHIAL NAVIGATION WITH BIOPSIES OF LEFT UPPER LOBE AND LEFT LOWER LOBE;  Surgeon: Grace Isaac, MD;  Location: [place];  Service: Thoracic;  Laterality: N/A;   Social History   Occupational History   Occupation: retired    Fish farm manager: RETIRED  Tobacco Use   Smoking status: Former    Types: Cigarettes    Quit date: 12/04/1958    Years since quitting: 62.8   Smokeless tobacco: Never  Vaping Use   Vaping Use: Never used  Substance and Sexual Activity   Alcohol use: Yes    Comment: occassionally   Drug use: No   Sexual activity: Not Currently

## 2021-10-10 ENCOUNTER — Telehealth: Payer: Self-pay | Admitting: Physical Therapy

## 2021-10-10 NOTE — Telephone Encounter (Signed)
Spoke to patient

## 2021-10-10 NOTE — Telephone Encounter (Signed)
Hi stephanie, I am unable to see what she sent to you guys?

## 2021-10-10 NOTE — Telephone Encounter (Signed)
Dr. Erlinda HongRaquel Sarna sent these to [place], could you or Mendel Ryder follow up with her?  We haven't seen her yet, her eval is scheduled this week.   Thanks- Colletta [place]

## 2021-10-11 ENCOUNTER — Other Ambulatory Visit: Payer: Self-pay

## 2021-10-11 DIAGNOSIS — I728 Aneurysm of other specified arteries: Secondary | ICD-10-CM

## 2021-10-13 ENCOUNTER — Ambulatory Visit: Payer: Medicare PPO | Admitting: Rehabilitative and Restorative Service Providers"

## 2021-10-13 ENCOUNTER — Encounter: Payer: Self-pay | Admitting: Rehabilitative and Restorative Service Providers"

## 2021-10-13 DIAGNOSIS — M25552 Pain in left hip: Secondary | ICD-10-CM

## 2021-10-13 DIAGNOSIS — R262 Difficulty in walking, not elsewhere classified: Secondary | ICD-10-CM

## 2021-10-13 DIAGNOSIS — M6281 Muscle weakness (generalized): Secondary | ICD-10-CM

## 2021-10-13 DIAGNOSIS — M25652 Stiffness of left hip, not elsewhere classified: Secondary | ICD-10-CM

## 2021-10-13 NOTE — Therapy (Signed)
OUTPATIENT PHYSICAL THERAPY LOWER EXTREMITY EVALUATION   Patient Name: [name] MRN: [id number] DOB:Nov 27, 1940, 81 y.o., female Today's Date: 10/13/2021   PT End of Session - 10/13/21 1321     Visit Number 1    Number of Visits 10    Date for PT Re-Evaluation 11/24/21    Authorization Type [employer]    Authorization - Number of Visits 10    Progress Note Due on Visit 10    PT Start Time 6606    PT Stop Time 1223    PT Time Calculation (min) 38 min    Activity Tolerance Patient tolerated treatment well;Patient limited by pain    Behavior During Therapy [facility] for tasks assessed/performed           Referring diagnosis? M16.12 (ICD-10-CM) - Primary osteoarthritis of left hip  Treatment diagnosis? (if different than referring diagnosis) R26.2   M62.81   M25.652   M25.552 What was this (referring dx) caused by? '[]'$  Surgery '[]'$  Fall '[x]'$  Ongoing issue '[x]'$  Arthritis '[]'$  Other: ____________  Laterality: '[]'$  Rt '[x]'$  Lt '[]'$  Both  Check all possible CPT codes:  *CHOOSE 10 OR LESS*    '[]'$  97110 (Therapeutic Exercise)  '[]'$  92507 (SLP Treatment)  '[]'$  97112 (Neuro Re-ed)   '[]'$  92526 (Swallowing Treatment)   '[]'$  97116 (Gait Training)   '[]'$  [id number] (Cognitive Training, 1st 15 minutes) '[]'$  97140 (Manual Therapy)   '[]'$  97130 (Cognitive Training, each add'l 15 minutes)  '[]'$  97164 (Re-evaluation)                              '[]'$  Other, List CPT Code ____________  '[]'$  30160 (Therapeutic Activities)     '[]'$  97535 (Self Care)   '[x]'$  All codes above (97110 - 97535)  '[]'$  97012 (Mechanical Traction)  '[]'$  97014 (E-stim Unattended)  '[]'$  97032 (E-stim manual)  '[]'$  97033 (Ionto)  '[]'$  97035 (Ultrasound) '[]'$  97750 (Physical Performance Training) '[]'$  [id number] (Aquatic Therapy) '[]'$  97016 (Vasopneumatic Device) '[]'$  [id number] (Paraffin) '[]'$  97034 (Contrast Bath) '[]'$  97597 (Wound Care 1st 20 sq cm) '[]'$  97598 (Wound Care each add'l 20 sq cm) '[]'$  97760 (Orthotic Fabrication, Fitting, Training Initial) '[]'$  [id number] (Prosthetic Management and  Training Initial) '[]'$  [id number] (Orthotic or Prosthetic Training/ Modification Subsequent)   Past Medical History:  Diagnosis Date   Abdominal aortic aneurysm ([place])    REPAIRED IN 1996 BY [name]  AND HAS RECENTLY BEEN FOLLOWED BY [name]   Acquired asplenia     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 Most likely scar. Mild subendocardial ischemia. EF 71% LOW RISK.    Cervical disc disease    fracture   Chronic kidney disease    COPD (chronic obstructive pulmonary disease) ([place])    Diverticulosis    Hyperlipidemia    Hypertension    Hypothyroidism (acquired)    hypo   Myocardial infarction [facility]) Aug. 2016   TIA   Polymyalgia rheumatica ([place])    2011 Dr. Charlestine Night   Rheumatoid arthritis [facility]) 2011   Dr.Truslow; fracture knees, hands and wrists -  S/P CABG x 1 1996   CABG--LIMA-LAD for ostial LAD (not felt to be PCI amenable). EF NORMAL then; LIMA now atretic   Shortness of breath dyspnea    with exertion   Stroke [facility]) 11-2014   TIA    Urinary frequency    Past Surgical History:  Procedure Laterality Date   ABDOMINAL AORTIC ANEURYSM REPAIR  5465   Complicated by mesenteric artery stenosis and splenic artery infarction with acquired Asplenia   APPENDECTOMY     BUNIONECTOMY  07/2011   right foot   CARDIAC CATHETERIZATION  2005   (Most recent CATH) - ostial LAD lesion 20-30% (down from 90% initially). Atretic LIMA. Minimal disease the RCA and Circumflex system.   CARPAL TUNNEL RELEASE Left    CATARACT EXTRACTION Bilateral    CERVICAL SPINE SURGERY     plate 2008 Dr. Saintclair Halsted   CORONARY ARTERY BYPASS GRAFT  1996   INCLUDED AN INTERNAL  MAMMARY ARTERY TO THE LAD. EF WAS NORMAL   INGUINAL HERNIA REPAIR Right    LAPAROSCOPIC APPENDECTOMY N/A 06/28/2016   Procedure: APPENDECTOMY LAPAROSCOPIC;  Surgeon: Leighton Ruff, MD;  [facility];  Service: General;  Laterality: N/A;   NM MYOVIEW LTD  March 2012   Fixed anteroseptal, apical and inferoapical defect with moderate size. Most likely scar. Mild subendocardial ischemia. EF 71% LOW RISK.    SPLENECTOMY     TRANSTHORACIC ECHOCARDIOGRAM  12/2014   [facility]: Normal LV size & function. EF 55-60%,    vagina polyp     VIDEO ASSISTED THORACOSCOPY (VATS)/WEDGE RESECTION Left 03/02/2015   Procedure: VIDEO ASSISTED THORACOSCOPY (VATS), MINI THORACOTOMY, LEFT UPPER LOBE WEDGE, TAKE DOWN OF INTERNAL MAMMARY LESIONS, PLACEMENT OF ON-Q PUMP;  Surgeon: Grace Isaac, MD;  Location: [place] [facility];  Service: Thoracic;  Laterality: Left;   VIDEO BRONCHOSCOPY N/A 03/02/2015   Procedure: BRONCHOSCOPY;  Surgeon: Grace Isaac, MD;  Location: [place];  Service: Thoracic;  Laterality: N/A;   VIDEO BRONCHOSCOPY WITH ENDOBRONCHIAL NAVIGATION N/A 10/08/2017   Procedure: VIDEO BRONCHOSCOPY WITH ENDOBRONCHIAL NAVIGATION WITH BIOPSIES OF LEFT UPPER LOBE AND LEFT LOWER LOBE;  Surgeon: Grace Isaac, MD;  Location: [place];  Service: Thoracic;  Laterality: N/A;   Patient Active Problem List   Diagnosis Date Noted   Primary osteoarthritis of left hip 10/06/2021   Left hip pain 12/09/2020   Aortic atherosclerosis ([place]) 09/07/2020   Gait disorder 06/08/2020   Neoplasm of uncertain behavior of skin 03/14/2020   Contusion of face 03/10/2020   Arthralgia 12/25/2019   Ankle swelling 08/08/2019   Hyperkalemia 05/15/2019   Osteoporosis 02/26/2018   Colon polyp 06/28/2016   COPD GOLD I 05/28/2016   Upper airway cough syndrome 05/22/2016   Acute respiratory failure with hypoxemia ([place]) 04/16/2016   Acute respiratory failure with hypoxia ([place]) 04/15/2016   Acute bronchitis 04/03/2016   Wrist pain,  right 12/14/2015   Lung mass 03/02/2015   Pre-operative cardiovascular examination 02/21/2015   DOE (dyspnea on exertion) 02/16/2015   Solitary pulmonary nodule 02/15/2015   TIA (transient ischemic attack) 12/31/2014   Diarrhea 11/26/2014   Well adult exam 11/26/2014   Seasonal allergies 12/31/2013   Splenic artery aneurysm ([place]) 04/07/2013   Cerumen impaction 03/20/2013   Dyslipidemia, goal LDL below 70    Pernicious anemia 09/04/2012   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   Long term (current) use of anticoagulants 11/08/2010   Rheumatoid arthritis ([place]) 06/23/2010   Pain in joint 11/26/2009   ROSACEA 03/23/2008  Hypothyroidism 12/08/2006   MACULAR DEGENERATION 12/08/2006   Essential hypertension 12/08/2006   Diaphragmatic hernia 12/08/2006   CRI (chronic renal insufficiency), stage 3 (moderate) ([place]) 12/08/2006   ASPLENIA 12/08/2006   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) 12/22/1994    Class: History of   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; now atretic and significantly improved LAD lesion without intervention 12/22/1994    Class: History of    PCP: Sunday Spillers, MD  REFERRING PROVIDER: Leandrew Koyanagi, MD  REFERRING DIAG: M16.12 (ICD-10-CM) - Primary osteoarthritis of left hip   THERAPY DIAG:  Difficulty in walking, not elsewhere classified  Muscle weakness (generalized)  Stiffness of left hip, not elsewhere classified  Pain in left hip  Rationale for Evaluation and Treatment Rehabilitation  ONSET DATE: Chronic  SUBJECTIVE:   SUBJECTIVE STATEMENT: Dynastie has a 1.5 year history of L hip/groin pain.  Dr. Erlinda Hong recommended a hip replacement and she desires to get in shape for surgery.  PERTINENT HISTORY: Previous aortic aneurysm, CAD, previous CABG X 1, chronic kidney disease, COPD, cervical DDD, hyperlipidemia, HTN, hypothyroid, rheumatoid arthritis, previous MI and stroke, shortness  of breath, previous R bunionectomy, L carpal tunnel release, R foot bunionectomy, cervical spine surgery, multiple abdominal surgeries, osteoporosis  PAIN:  Are you having pain? Yes: NPRS scale: 2-7/10 Pain location: L hip and groin Pain description: Achy, can be sharp Aggravating factors: WB Relieving factors: Change of position  PRECAUTIONS: None  WEIGHT BEARING RESTRICTIONS No  FALLS:  Has patient fallen in last 6 months? No  LIVING ENVIRONMENT: Lives with: lives with their spouse Lives in: House/apartment Stairs:  Avoids stairs Has following equipment at home: Environmental consultant - 4 wheeled  OCCUPATION: Retired  PLOF: Independent  PATIENT GOALS Get the surgery to improve pain and be able to walk without the walker   OBJECTIVE:   DIAGNOSTIC FINDINGS: Advanced degenerative joint disease with bone-on-bone joint space  narrowing.   PATIENT SURVEYS:  FOTO 39 (Goal 52 in 13 visits)  COGNITION:  Overall cognitive status: Within functional limits for tasks assessed     SENSATION: No tingling or numbness  MUSCLE LENGTH: Hamstrings: Right 40 deg; Left 30 deg  LOWER EXTREMITY ROM:  Passive ROM Right eval Left eval  Hip flexion 105 50  Hip extension    Hip abduction    Hip adduction    Hip internal rotation 15 NT  Hip external rotation 32 NT  Knee flexion    Knee extension    Ankle dorsiflexion    Ankle plantarflexion    Ankle inversion    Ankle eversion     (Blank rows = not tested)  LOWER EXTREMITY MMT:  MMT Right eval Left eval  Hip flexion    Hip extension    Hip abduction 4-/5 2/5  Hip adduction    Hip internal rotation    Hip external rotation    Knee flexion    Knee extension    Ankle dorsiflexion    Ankle plantarflexion    Ankle inversion    Ankle eversion     (Blank rows = not tested)  GAIT: Distance walked: In the clinic Assistive device utilized: Gilford Rile - 4 wheeled Level of assistance: Modified independence Comments: Uses walker to  decrease L hip and groin pain    TODAY'S TREATMENT: 10/13/2021 Lower trunk rotation 20X 10 seconds Bridging 10X 5 seconds Side lie clams (le R, lift L) 2 sets  of 10 3 seconds   PATIENT EDUCATION:  Education details: Reviewed exam findings  Person educated: Patient Education method: Explanation, Demonstration, Verbal cues, and Handouts Education comprehension: verbalized understanding, returned demonstration, verbal cues required, and needs further education   HOME EXERCISE PROGRAM: Access Code: [email or web address] URL: [email or web address] Date: 10/13/2021 Prepared by: Vista Mink  Exercises - Supine Lower Trunk Rotation  - 2 x daily - 7 x weekly - 1 sets - 10 reps - 10 seconds hold - Clam  - 2 x daily - 7 x weekly - 1-2 sets - 10 reps - 3 seconds hold - Bridge  - 2 x daily - 7 x weekly - 1-2 sets - 10 reps - 3-5 seconds hold  ASSESSMENT:  CLINICAL IMPRESSION: Patient is a 81 y.o. female who was seen today for physical therapy evaluation and treatment for L hip bone on bone OA.  She has the expected limitations in hip AROM, strength and WB function.  She would like to be in better shape for surgery which will likely be performed at the end of 3-4 weeks of supervised PT.   OBJECTIVE IMPAIRMENTS Abnormal gait, cardiopulmonary status limiting activity, decreased activity tolerance, decreased balance, decreased endurance, decreased knowledge of condition, decreased mobility, difficulty walking, decreased ROM, decreased strength, impaired perceived functional ability, impaired flexibility, and pain.   ACTIVITY LIMITATIONS bending, standing, squatting, stairs, transfers, bed mobility, dressing, and locomotion level  PARTICIPATION LIMITATIONS: community activity  PERSONAL FACTORS Previous aortic aneurysm, CAD, previous CABG X 1, chronic kidney disease, COPD, cervical DDD, hyperlipidemia, HTN, hypothyroid, rheumatoid arthritis, previous MI and stroke, shortness of breath,  previous R bunionectomy, L carpal tunnel release, R foot bunionectomy, cervical spine surgery, multiple abdominal surgeries, osteoporosis are also affecting patient's functional outcome.   REHAB POTENTIAL: Good to meet the below listed goals.  Will likely need THA for the best functional outcome.  CLINICAL DECISION MAKING: Stable/uncomplicated  EVALUATION COMPLEXITY: Low   GOALS: Goals reviewed with patient? Yes  SHORT TERM GOALS: Target date: 11/10/2021  Improve L hip flexion AROM to at least 70 degrees Baseline: 50 degrees Goal status: INITIAL  2.  Improve L hip abductors strength as assessed by FOTO and WB endurance Baseline: See FOTO and limited standing and walking endurance with a walker Goal status: INITIAL   LONG TERM GOALS: Target date: 11/24/2021   Improve FOTO to 52 Baseline: 39 Goal status: INITIAL  2.  Lauralyn will be independent with her pre-operative HEP at DC. Baseline: Started 10/13/2021 Goal status: INITIAL   PLAN: PT FREQUENCY: 1-2x/week  PT DURATION: 6 weeks  PLANNED INTERVENTIONS: Therapeutic exercises, Therapeutic activity, Neuromuscular re-education, Balance training, Gait training, Patient/Family education, Stair training, Cryotherapy, and Manual therapy  PLAN FOR NEXT SESSION: Review starter HEP.  Progress hip strength (emphasis on abductors), hip AROM (try modified Thomas stretch).  Prepare for surgery.   Farley Ly, PT, MPT 10/13/2021, 1:32 PM

## 2021-10-14 ENCOUNTER — Telehealth: Payer: Self-pay

## 2021-10-14 NOTE — Telephone Encounter (Signed)
-----   Message from Riley Lam sent at 10/14/2021 11:26 AM EDT ----- Regarding: RE: CTA Scheduled 6/24 pt is aware  Kenney Houseman  ----- Message ----- From: Augustine Radar Sent: 10/14/2021  11:09 AM EDT To: Riley Lam Subject: FW: CTA                                         ----- Message ----- From: Kaleen Mask, LPN Sent: 0/60/0459   4:30 PM EDT To: April H Pait; Cathlean Cower; # Subject: CTA                                            Please schedule at [facility] prior to appt on 10/24/21.  Thanks.

## 2021-10-18 ENCOUNTER — Ambulatory Visit (INDEPENDENT_AMBULATORY_CARE_PROVIDER_SITE_OTHER): Payer: Medicare PPO | Admitting: Physical Therapy

## 2021-10-18 ENCOUNTER — Encounter: Payer: Self-pay | Admitting: Physical Therapy

## 2021-10-18 DIAGNOSIS — R279 Unspecified lack of coordination: Secondary | ICD-10-CM | POA: Diagnosis not present

## 2021-10-18 DIAGNOSIS — R262 Difficulty in walking, not elsewhere classified: Secondary | ICD-10-CM | POA: Diagnosis not present

## 2021-10-18 DIAGNOSIS — M25552 Pain in left hip: Secondary | ICD-10-CM | POA: Diagnosis not present

## 2021-10-18 DIAGNOSIS — M6281 Muscle weakness (generalized): Secondary | ICD-10-CM

## 2021-10-18 DIAGNOSIS — M25652 Stiffness of left hip, not elsewhere classified: Secondary | ICD-10-CM

## 2021-10-18 NOTE — Therapy (Signed)
OUTPATIENT PHYSICAL THERAPY TREATMENT NOTE   Patient Name: [name] MRN: [id number] DOB:February 13, 1941, 81 y.o.,, female Today's Date: 10/18/2021  PCP: Sunday Spillers, MD REFERRING PROVIDER: Leandrew Koyanagi, MD  END OF SESSION:   PT End of Session - 10/18/21 0948     Visit Number 2    Number of Visits 10    Date for PT Re-Evaluation 11/24/21    Authorization Type [employer]    Authorization - Number of Visits 10    PT Start Time 0930    PT Stop Time 1010    PT Time Calculation (min) 40 min    Activity Tolerance Patient tolerated treatment well;Patient limited by pain    Behavior During Therapy WFL for tasks assessed/performed             Past Medical History:  Diagnosis Date   Abdominal aortic aneurysm ([place])    REPAIRED IN 1996 BY [name]  AND HAS RECENTLY BEEN FOLLOWED BY [name]   Acquired asplenia     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 Most likely scar. Mild subendocardial ischemia. EF 71% LOW RISK.    Cervical disc disease    fracture   Chronic kidney disease    COPD (chronic obstructive pulmonary disease) ([place])    Diverticulosis    Hyperlipidemia    Hypertension    Hypothyroidism (acquired)    hypo   Myocardial infarction [facility]) Aug. 2016   TIA   Polymyalgia rheumatica ([place])    2011 Dr. Charlestine Night   Rheumatoid arthritis [facility]) 2011   Dr.Truslow; fracture knees, hands and wrists -    S/P CABG x 1 1996   CABG--LIMA-LAD for ostial LAD (not felt to be PCI amenable). EF NORMAL then; LIMA now atretic   Shortness of breath dyspnea    with exertion   Stroke [facility]) 11-2014   TIA    Urinary frequency    Past Surgical  History:  Procedure Laterality Date   ABDOMINAL AORTIC ANEURYSM REPAIR  4098   Complicated by mesenteric artery stenosis and splenic artery infarction with acquired Asplenia   APPENDECTOMY     BUNIONECTOMY  07/2011   right foot   CARDIAC CATHETERIZATION  2005   (Most recent CATH) - ostial LAD lesion 20-30% (down from 90% initially). Atretic LIMA. Minimal disease the RCA and Circumflex system.   CARPAL TUNNEL RELEASE Left    CATARACT EXTRACTION Bilateral    CERVICAL SPINE SURGERY     plate 2008 Dr. Saintclair Halsted   CORONARY ARTERY BYPASS GRAFT  1996   INCLUDED AN INTERNAL MAMMARY ARTERY TO THE LAD. EF WAS NORMAL   INGUINAL HERNIA REPAIR Right    LAPAROSCOPIC APPENDECTOMY N/A 06/28/2016   Procedure: APPENDECTOMY LAPAROSCOPIC;  Surgeon: Leighton Ruff, MD;  Location: [facility];  Service: General;  Laterality: N/A;   NM MYOVIEW LTD  March 2012   Fixed anteroseptal, apical and inferoapical defect with moderate size. Most likely scar. Mild subendocardial ischemia. EF 71% LOW RISK.    SPLENECTOMY     TRANSTHORACIC ECHOCARDIOGRAM  12/2014   [facility]: Normal LV size & function. EF  55-60%,    vagina polyp     VIDEO ASSISTED THORACOSCOPY (VATS)/WEDGE RESECTION Left 03/02/2015   Procedure: VIDEO ASSISTED THORACOSCOPY (VATS), MINI THORACOTOMY, LEFT UPPER LOBE WEDGE, TAKE DOWN OF INTERNAL MAMMARY LESIONS, PLACEMENT OF ON-Q PUMP;  Surgeon: Grace Isaac, MD;  Location: [facility];  Service: Thoracic;  Laterality: Left;   VIDEO BRONCHOSCOPY N/A 03/02/2015   Procedure: BRONCHOSCOPY;  Surgeon: Grace Isaac, MD;  Location: [place];  Service: Thoracic;  Laterality: N/A;   VIDEO BRONCHOSCOPY WITH ENDOBRONCHIAL NAVIGATION N/A 10/08/2017   Procedure: VIDEO BRONCHOSCOPY WITH ENDOBRONCHIAL NAVIGATION WITH BIOPSIES OF LEFT UPPER LOBE AND LEFT LOWER LOBE;  Surgeon: Grace Isaac, MD;  Location: [place];  Service: Thoracic;  Laterality: N/A;   Patient Active Problem List   Diagnosis Date Noted   Primary  osteoarthritis of left hip 10/06/2021   Left hip pain 12/09/2020   Aortic atherosclerosis ([place]) 09/07/2020   Gait disorder 06/08/2020   Neoplasm of uncertain behavior of skin 03/14/2020   Contusion of face 03/10/2020   Arthralgia 12/25/2019   Ankle swelling 08/08/2019   Hyperkalemia 05/15/2019   Osteoporosis 02/26/2018   Colon polyp 06/28/2016   COPD GOLD I 05/28/2016   Upper airway cough syndrome 05/22/2016   Acute respiratory failure with hypoxemia ([place]) 04/16/2016   Acute respiratory failure with hypoxia ([place]) 04/15/2016   Acute bronchitis 04/03/2016   Wrist pain, right 12/14/2015   Lung mass 03/02/2015   Pre-operative cardiovascular examination 02/21/2015   DOE (dyspnea on exertion) 02/16/2015   Solitary pulmonary nodule 02/15/2015   TIA (transient ischemic attack) 12/31/2014   Diarrhea 11/26/2014   Well adult exam 11/26/2014   Seasonal allergies 12/31/2013   Splenic artery aneurysm ([place]) 04/07/2013   Cerumen impaction 03/20/2013   Dyslipidemia, goal LDL below 70    Pernicious anemia 09/04/2012   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   Long term (current) use of anticoagulants 11/08/2010   Rheumatoid arthritis ([place]) 06/23/2010   Pain in joint 11/26/2009   ROSACEA 03/23/2008   Hypothyroidism 12/08/2006   MACULAR DEGENERATION 12/08/2006   Essential hypertension 12/08/2006   Diaphragmatic hernia 12/08/2006   CRI (chronic renal insufficiency), stage 3 (moderate) ([place]) 12/08/2006   ASPLENIA 12/08/2006   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) 12/22/1994    Class: History of   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; now atretic and significantly improved LAD lesion without intervention 12/22/1994    Class: History of    REFERRING DIAG:  M16.12 (ICD-10-CM) - Primary osteoarthritis of left hip   THERAPY DIAG:  Difficulty in walking, not elsewhere classified  Muscle weakness (generalized)  Stiffness  of left hip, not elsewhere classified  Pain in left hip  Unspecified lack of coordination  Rationale for Evaluation and Treatment Rehabilitation  PERTINENT HISTORY: Previous aortic aneurysm, CAD, previous CABG X 1, chronic kidney disease, COPD, cervical DDD, hyperlipidemia, HTN, hypothyroid, rheumatoid arthritis, previous MI and stroke, shortness of breath, previous R bunionectomy, L carpal tunnel release, R foot bunionectomy, cervical spine surgery, multiple abdominal surgeries, osteoporosis  PRECAUTIONS: none  SUBJECTIVE: Pt arriving today reporting 2-3/10 pain in her left hip. Pt reporting compliance with her HEP.   PAIN:  Are you having pain? Yes: NPRS scale: 2-3/10 Pain location: left hip Pain description: achy Aggravating factors: walking Relieving factors: sit down   OBJECTIVE: (objective measures completed at initial evaluation unless otherwise dated)  DIAGNOSTIC FINDINGS: Advanced degenerative joint disease with bone-on-bone joint  space  narrowing.    PATIENT SURVEYS:  FOTO 39 (Goal 52 in 13 visits)   COGNITION:           Overall cognitive status: Within functional limits for tasks assessed                          SENSATION: No tingling or numbness   MUSCLE LENGTH: Hamstrings: Right 40 deg; Left 30 deg   LOWER EXTREMITY ROM:   Passive ROM Right eval Left eval  Hip flexion 105 50  Hip extension      Hip abduction      Hip adduction      Hip internal rotation 15 NT  Hip external rotation 32 NT  Knee flexion      Knee extension      Ankle dorsiflexion      Ankle plantarflexion      Ankle inversion      Ankle eversion       (Blank rows = not tested)   LOWER EXTREMITY MMT:   MMT Right eval Left eval  Hip flexion      Hip extension      Hip abduction 4-/5 2/5  Hip adduction      Hip internal rotation      Hip external rotation      Knee flexion      Knee extension      Ankle dorsiflexion      Ankle plantarflexion      Ankle inversion       Ankle eversion       (Blank rows = not tested)   GAIT: Distance walked: In the clinic Assistive device utilized: Walker - 4 wheeled Level of assistance: Modified independence Comments: Uses walker to decrease L hip and groin pain       TODAY'S TREATMENT: 10/18/2021:  TherEx:  Sit to stand 20 inch height of mat table with UE support x 10 Clam Shells sitting 2 x 10 Level 3 band  Standing calf raises: 2 x 10 c UE support Standing hip abd: x 10 each LE c UE support Ball squeezes sitting:  x 15 holding 5 sec Supine trunk rotation: x 3 each holding 20 sec Bridges: 2 x 10 holding 5 sec Hamstring stretch: x 2 holding 30 sec each LE Piriformis stretch: x 2 each LE Supine marching x 20 each LE with core activation cues Sidelying clams: x 10 each LE Sidelying reverse clams x 10 each LE Modified Thomas stretch in supine    10/13/2021 Lower trunk rotation 20X 10 seconds Bridging 10X 5 seconds Side lie clams (le R, lift L) 2 sets of 10 3 seconds     PATIENT EDUCATION:  Education details: Reviewed exam findings  Person educated: Patient Education method: Consulting civil engineer, Demonstration, Verbal cues, and Handouts Education comprehension: verbalized understanding, returned demonstration, verbal cues required, and needs further education     HOME EXERCISE PROGRAM: Access Code: [email or web address] URL: [email or web address] Date: 10/13/2021 Prepared by: Vista Mink   Exercises - Supine Lower Trunk Rotation  - 2 x daily - 7 x weekly - 1 sets - 10 reps - 10 seconds hold - Clam  - 2 x daily - 7 x weekly - 1-2 sets - 10 reps - 3 seconds hold - Bridge  - 2 x daily - 7 x weekly - 1-2 sets - 10 reps - 3-5 seconds hold   ASSESSMENT:   CLINICAL IMPRESSION: Pt tolerating exercises well today with  no reports of increased pain during session. Pt with difficulty placing full weight on her Left LE. Continue skilled PT as pt tolerates to maximize function.      OBJECTIVE IMPAIRMENTS  Abnormal gait, cardiopulmonary status limiting activity, decreased activity tolerance, decreased balance, decreased endurance, decreased knowledge of condition, decreased mobility, difficulty walking, decreased ROM, decreased strength, impaired perceived functional ability, impaired flexibility, and pain.    ACTIVITY LIMITATIONS bending, standing, squatting, stairs, transfers, bed mobility, dressing, and locomotion level   PARTICIPATION LIMITATIONS: community activity   PERSONAL FACTORS Previous aortic aneurysm, CAD, previous CABG X 1, chronic kidney disease, COPD, cervical DDD, hyperlipidemia, HTN, hypothyroid, rheumatoid arthritis, previous MI and stroke, shortness of breath, previous R bunionectomy, L carpal tunnel release, R foot bunionectomy, cervical spine surgery, multiple abdominal surgeries, osteoporosis are also affecting patient's functional outcome.    REHAB POTENTIAL: Good to meet the below listed goals.  Will likely need THA for the best functional outcome.   CLINICAL DECISION MAKING: Stable/uncomplicated   EVALUATION COMPLEXITY: Low     GOALS: Goals reviewed with patient? Yes   SHORT TERM GOALS: Target date: 11/10/2021  Improve L hip flexion AROM to at least 70 degrees Baseline: 50 degrees Goal status: On-going 10/18/2021   2.  Improve L hip abductors strength as assessed by FOTO and WB endurance Baseline: See FOTO and limited standing and walking endurance with a walker Goal status: INITIAL     LONG TERM GOALS: Target date: 11/24/2021    Improve FOTO to 52 Baseline: 39 Goal status: INITIAL   2.  Larken will be independent with her pre-operative HEP at DC. Baseline: Started 10/13/2021 Goal status: INITIAL     PLAN: PT FREQUENCY: 1-2x/week   PT DURATION: 6 weeks   PLANNED INTERVENTIONS: Therapeutic exercises, Therapeutic activity, Neuromuscular re-education, Balance training, Gait training, Patient/Family education, Stair training, Cryotherapy, and Manual  therapy   PLAN FOR NEXT SESSION: Progress hip strength (emphasis on abductors), hip AROM,  Prepare for surgery.       Oretha Caprice, PT, MPT 10/18/2021, 9:51 AM

## 2021-10-21 ENCOUNTER — Other Ambulatory Visit: Payer: Self-pay | Admitting: Internal Medicine

## 2021-10-22 ENCOUNTER — Other Ambulatory Visit (HOSPITAL_COMMUNITY): Payer: Medicare PPO

## 2021-10-22 ENCOUNTER — Ambulatory Visit (HOSPITAL_COMMUNITY): Admission: RE | Admit: 2021-10-22 | Payer: Medicare PPO | Source: Ambulatory Visit

## 2021-10-22 ENCOUNTER — Inpatient Hospital Stay (HOSPITAL_COMMUNITY): Admission: RE | Admit: 2021-10-22 | Payer: Medicare PPO | Source: Ambulatory Visit

## 2021-10-22 ENCOUNTER — Encounter (HOSPITAL_COMMUNITY): Payer: Self-pay

## 2021-10-24 ENCOUNTER — Ambulatory Visit: Payer: Medicare PPO | Admitting: Surgery

## 2021-10-25 NOTE — Therapy (Signed)
OUTPATIENT PHYSICAL THERAPY TREATMENT NOTE   Patient Name: [name] MRN: [id number] DOB:Jun 14, 1940, 81 y.o., female Today's Date: 10/26/2021  PCP: Sunday Spillers, MD REFERRING PROVIDER: Leandrew Koyanagi, MD  END OF SESSION:   PT End of Session - 10/26/21 0944     Visit Number 3    Number of Visits 10    Date for PT Re-Evaluation 11/24/21    Authorization Type [employer]    Authorization - Number of Visits 10    PT Start Time 0932    PT Stop Time 1010    PT Time Calculation (min) 38 min    Activity Tolerance Patient tolerated treatment well;Patient limited by pain    Behavior During Therapy WFL for tasks assessed/performed              Past Medical History:  Diagnosis Date   Abdominal aortic aneurysm ([place])    REPAIRED IN 1996 BY [name]  AND HAS RECENTLY BEEN FOLLOWED BY [name]   Acquired asplenia     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 Most likely scar. Mild subendocardial ischemia. EF 71% LOW RISK.    Cervical disc disease    fracture   Chronic kidney disease    COPD (chronic obstructive pulmonary disease) ([place])    Diverticulosis    Hyperlipidemia    Hypertension    Hypothyroidism (acquired)    hypo   Myocardial infarction [facility]) Aug. 2016   TIA   Polymyalgia rheumatica ([place])    2011 Dr. Charlestine Night   Rheumatoid arthritis [facility]) 2011   Dr.Truslow; fracture knees, hands and wrists -    S/P CABG x 1 1996   CABG--LIMA-LAD for ostial LAD (not felt to be PCI amenable). EF NORMAL then; LIMA now atretic   Shortness of breath dyspnea    with exertion   Stroke [facility]) 11-2014   TIA    Urinary frequency    Past Surgical  History:  Procedure Laterality Date   ABDOMINAL AORTIC ANEURYSM REPAIR  0539   Complicated by mesenteric artery stenosis and splenic artery infarction with acquired Asplenia   APPENDECTOMY     BUNIONECTOMY  07/2011   right foot   CARDIAC CATHETERIZATION  2005   (Most recent CATH) - ostial LAD lesion 20-30% (down from 90% initially). Atretic LIMA. Minimal disease the RCA and Circumflex system.   CARPAL TUNNEL RELEASE Left    CATARACT EXTRACTION Bilateral    CERVICAL SPINE SURGERY     plate 2008 Dr. Saintclair Halsted   CORONARY ARTERY BYPASS GRAFT  1996   INCLUDED AN INTERNAL MAMMARY ARTERY TO THE LAD. EF WAS NORMAL   INGUINAL HERNIA REPAIR Right    LAPAROSCOPIC APPENDECTOMY N/A 06/28/2016   Procedure: APPENDECTOMY LAPAROSCOPIC;  Surgeon: Leighton Ruff, MD;  Location: [facility];  Service: General;  Laterality: N/A;   NM MYOVIEW LTD  March 2012   Fixed anteroseptal, apical and inferoapical defect with moderate size. Most likely scar. Mild subendocardial ischemia. EF 71% LOW RISK.    SPLENECTOMY     TRANSTHORACIC ECHOCARDIOGRAM  12/2014   [facility]: Normal LV size & function.  EF 55-60%,    vagina polyp     VIDEO ASSISTED THORACOSCOPY (VATS)/WEDGE RESECTION Left 03/02/2015   Procedure: VIDEO ASSISTED THORACOSCOPY (VATS), MINI THORACOTOMY, LEFT UPPER LOBE WEDGE, TAKE DOWN OF INTERNAL MAMMARY LESIONS, PLACEMENT OF ON-Q PUMP;  Surgeon: Grace Isaac, MD;  Location: [place] OR;  Service: Thoracic;  Laterality: Left;   VIDEO BRONCHOSCOPY N/A 03/02/2015   Procedure: BRONCHOSCOPY;  Surgeon: Grace Isaac, MD;  Location: [place];  Service: Thoracic;  Laterality: N/A;   VIDEO BRONCHOSCOPY WITH ENDOBRONCHIAL NAVIGATION N/A 10/08/2017   Procedure: VIDEO BRONCHOSCOPY WITH ENDOBRONCHIAL NAVIGATION WITH BIOPSIES OF LEFT UPPER LOBE AND LEFT LOWER LOBE;  Surgeon: Grace Isaac, MD;  Location: [place];  Service: Thoracic;  Laterality: N/A;   Patient Active Problem List   Diagnosis Date Noted   Primary  osteoarthritis of left hip 10/06/2021   Left hip pain 12/09/2020   Aortic atherosclerosis ([place]) 09/07/2020   Gait disorder 06/08/2020   Neoplasm of uncertain behavior of skin 03/14/2020   Contusion of face 03/10/2020   Arthralgia 12/25/2019   Ankle swelling 08/08/2019   Hyperkalemia 05/15/2019   Osteoporosis 02/26/2018   Colon polyp 06/28/2016   COPD GOLD I 05/28/2016   Upper airway cough syndrome 05/22/2016   Acute respiratory failure with hypoxemia ([place]) 04/16/2016   Acute respiratory failure with hypoxia ([place]) 04/15/2016   Acute bronchitis 04/03/2016   Wrist pain, right 12/14/2015   Lung mass 03/02/2015   Pre-operative cardiovascular examination 02/21/2015   DOE (dyspnea on exertion) 02/16/2015   Solitary pulmonary nodule 02/15/2015   TIA (transient ischemic attack) 12/31/2014   Diarrhea 11/26/2014   Well adult exam 11/26/2014   Seasonal allergies 12/31/2013   Splenic artery aneurysm ([place]) 04/07/2013   Cerumen impaction 03/20/2013   Dyslipidemia, goal LDL below 70    Pernicious anemia 09/04/2012   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   Long term (current) use of anticoagulants 11/08/2010   Rheumatoid arthritis ([place]) 06/23/2010   Pain in joint 11/26/2009   ROSACEA 03/23/2008   Hypothyroidism 12/08/2006   MACULAR DEGENERATION 12/08/2006   Essential hypertension 12/08/2006   Diaphragmatic hernia 12/08/2006   CRI (chronic renal insufficiency), stage 3 (moderate) ([place]) 12/08/2006   ASPLENIA 12/08/2006   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) 12/22/1994    Class: History of   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; now atretic and significantly improved LAD lesion without intervention 12/22/1994    Class: History of    REFERRING DIAG:  M16.12 (ICD-10-CM) - Primary osteoarthritis of left hip   THERAPY DIAG:  Difficulty in walking, not elsewhere classified  Muscle weakness (generalized)  Stiffness  of left hip, not elsewhere classified  Pain in left hip  Unspecified lack of coordination  Rationale for Evaluation and Treatment Rehabilitation  PERTINENT HISTORY: Previous aortic aneurysm, CAD, previous CABG X 1, chronic kidney disease, COPD, cervical DDD, hyperlipidemia, HTN, hypothyroid, rheumatoid arthritis, previous MI and stroke, shortness of breath, previous R bunionectomy, L carpal tunnel release, R foot bunionectomy, cervical spine surgery, multiple abdominal surgeries, osteoporosis  PRECAUTIONS: none  SUBJECTIVE: Pt arriving today reporting 2-3/10 pain in her left hip. Pt stating standing up with her left leg makes her pain worse.   PAIN:  Are you having pain? Yes: NPRS scale: 2-3/10 Pain location: left hip Pain description: achy Aggravating factors: walking Relieving factors: sit down   OBJECTIVE: (objective measures completed at initial evaluation unless otherwise dated)  DIAGNOSTIC FINDINGS:  Advanced degenerative joint disease with bone-on-bone joint space  narrowing.    PATIENT SURVEYS:  FOTO 39 (Goal 52 in 13 visits)   COGNITION:           Overall cognitive status: Within functional limits for tasks assessed                          SENSATION: No tingling or numbness   MUSCLE LENGTH: Hamstrings: Right 40 deg; Left 30 deg   LOWER EXTREMITY ROM:   Passive ROM Right eval Left eval  Hip flexion 105 50  Hip extension      Hip abduction      Hip adduction      Hip internal rotation 15 NT  Hip external rotation 32 NT  Knee flexion      Knee extension      Ankle dorsiflexion      Ankle plantarflexion      Ankle inversion      Ankle eversion       (Blank rows = not tested)   LOWER EXTREMITY MMT:   MMT Right eval Left eval  Hip flexion      Hip extension      Hip abduction 4-/5 2/5  Hip adduction      Hip internal rotation      Hip external rotation      Knee flexion      Knee extension      Ankle dorsiflexion      Ankle plantarflexion       Ankle inversion      Ankle eversion       (Blank rows = not tested)   GAIT: Distance walked: In the clinic Assistive device utilized: Walker - 4 wheeled Level of assistance: Modified independence Comments: Uses walker to decrease L hip and groin pain       TODAY'S TREATMENT: 10/26/21:  TherEx:  Nustep: Level 4 x 6 minutes  Sit to stand 20 inch height of mat table with UE support x 10 Standing calf raises/toe raises: x 15 c UE support Standing hip abd: x 10 each LE c UE support Standing hip extenion 2 x 10 each  c UE support Standing min squats x 10 c UE support Standing on Airex: feet apart x 20 sec, feet apart eyes closed x 20 sec Standing on Airex:  1/2 tandem stance: x 20 sec Supine trunk rotation: x 3 each holding 20 sec Standing cone taps x 20 c single UE support Bridges: 2 x 10 holding 5 sec Figure 4 piriformis stretch x 2 each LE hold 20 sec Supine STKC x 2 each LE holding 20 seconds Sidelying clams: x 15 each LE Sidelying reverse clams x 15 each LE    10/18/2021:  TherEx:  Sit to stand 20 inch height of mat table with UE support x 10 Clam Shells sitting 2 x 10 Level 3 band  Standing calf raises: 2 x 10 c UE support Standing hip abd: x 10 each LE c UE support Ball squeezes sitting:  x 15 holding 5 sec Supine trunk rotation: x 3 each holding 20 sec Bridges: 2 x 10 holding 5 sec Hamstring stretch: x 2 holding 30 sec each LE Piriformis stretch: x 2 each LE Supine marching x 20 each LE with core activation cues Sidelying clams: x 10 each LE Sidelying reverse clams x 10 each LE Modified Thomas stretch in supine    10/13/2021 Lower trunk rotation 20X  10 seconds Bridging 10X 5 seconds Side lie clams (le R, lift L) 2 sets of 10 3 seconds     PATIENT EDUCATION:  Education details: Reviewed exam findings  Person educated: Patient Education method: Explanation, Demonstration, Verbal cues, and Handouts Education comprehension: verbalized understanding, returned  demonstration, verbal cues required, and needs further education     HOME EXERCISE PROGRAM: Access Code: [email or web address] URL: [email or web address] Date: 10/13/2021 Prepared by: Vista Mink   Exercises - Supine Lower Trunk Rotation  - 2 x daily - 7 x weekly - 1 sets - 10 reps - 10 seconds hold - Clam  - 2 x daily - 7 x weekly - 1-2 sets - 10 reps - 3 seconds hold - Bridge  - 2 x daily - 7 x weekly - 1-2 sets - 10 reps - 3-5 seconds hold   ASSESSMENT:   CLINICAL IMPRESSION: Pt with evidence of lack of stability and strength when weight shifting to left LE. Pt requiring water for hydration and sitting and standing rest breaks throughout session. Pt with difficulty with bilateral hip IR. Continue skilled PT as pt tolerates to maximize function.      OBJECTIVE IMPAIRMENTS Abnormal gait, cardiopulmonary status limiting activity, decreased activity tolerance, decreased balance, decreased endurance, decreased knowledge of condition, decreased mobility, difficulty walking, decreased ROM, decreased strength, impaired perceived functional ability, impaired flexibility, and pain.    ACTIVITY LIMITATIONS bending, standing, squatting, stairs, transfers, bed mobility, dressing, and locomotion level   PARTICIPATION LIMITATIONS: community activity   PERSONAL FACTORS Previous aortic aneurysm, CAD, previous CABG X 1, chronic kidney disease, COPD, cervical DDD, hyperlipidemia, HTN, hypothyroid, rheumatoid arthritis, previous MI and stroke, shortness of breath, previous R bunionectomy, L carpal tunnel release, R foot bunionectomy, cervical spine surgery, multiple abdominal surgeries, osteoporosis are also affecting patient's functional outcome.    REHAB POTENTIAL: Good to meet the below listed goals.  Will likely need THA for the best functional outcome.   CLINICAL DECISION MAKING: Stable/uncomplicated   EVALUATION COMPLEXITY: Low     GOALS: Goals reviewed with patient? Yes   SHORT TERM  GOALS: Target date: 11/10/2021  Improve L hip flexion AROM to at least 70 degrees Baseline: 50 degrees Goal status: Ongoing  10/26/21   2.  Improve L hip abductors strength as assessed by FOTO and WB endurance Baseline: See FOTO and limited standing and walking endurance with a walker Goal status: INITIAL     LONG TERM GOALS: Target date: 11/24/2021    Improve FOTO to 52 Baseline: 39 Goal status: INITIAL   2.  Alphia will be independent with her pre-operative HEP at DC. Baseline: Started 10/13/2021 Goal status: INITIAL     PLAN: PT FREQUENCY: 1-2x/week   PT DURATION: 6 weeks   PLANNED INTERVENTIONS: Therapeutic exercises, Therapeutic activity, Neuromuscular re-education, Balance training, Gait training, Patient/Family education, Stair training, Cryotherapy, and Manual therapy   PLAN FOR NEXT SESSION: Progress hip strength abd/add, IR, and ER,  hip AROM,  Prepare for surgery. Progression of weight shifting and SLS on bil LE's.        Oretha Caprice, PT, MPT 10/26/2021, 10:07 AM

## 2021-10-26 ENCOUNTER — Encounter: Payer: Self-pay | Admitting: Physical Therapy

## 2021-10-26 ENCOUNTER — Ambulatory Visit: Payer: Medicare PPO | Admitting: Physical Therapy

## 2021-10-26 DIAGNOSIS — M6281 Muscle weakness (generalized): Secondary | ICD-10-CM

## 2021-10-26 DIAGNOSIS — M25552 Pain in left hip: Secondary | ICD-10-CM | POA: Diagnosis not present

## 2021-10-26 DIAGNOSIS — R279 Unspecified lack of coordination: Secondary | ICD-10-CM | POA: Diagnosis not present

## 2021-10-26 DIAGNOSIS — R262 Difficulty in walking, not elsewhere classified: Secondary | ICD-10-CM

## 2021-10-26 DIAGNOSIS — M25652 Stiffness of left hip, not elsewhere classified: Secondary | ICD-10-CM | POA: Diagnosis not present

## 2021-10-28 ENCOUNTER — Encounter: Payer: Self-pay | Admitting: Physical Therapy

## 2021-10-28 ENCOUNTER — Ambulatory Visit: Payer: Medicare PPO | Admitting: Physical Therapy

## 2021-10-28 DIAGNOSIS — M25552 Pain in left hip: Secondary | ICD-10-CM

## 2021-10-28 DIAGNOSIS — R262 Difficulty in walking, not elsewhere classified: Secondary | ICD-10-CM | POA: Diagnosis not present

## 2021-10-28 DIAGNOSIS — M6281 Muscle weakness (generalized): Secondary | ICD-10-CM

## 2021-10-28 DIAGNOSIS — M25652 Stiffness of left hip, not elsewhere classified: Secondary | ICD-10-CM

## 2021-10-28 NOTE — Therapy (Signed)
OUTPATIENT PHYSICAL THERAPY TREATMENT NOTE   Patient Name: [name] MRN: [id number] DOB:Feb 26, 1941, 81 y.o., female Today's Date: 10/28/2021  PCP: Sunday Spillers, MD REFERRING PROVIDER: Leandrew Koyanagi, MD  END OF SESSION:   PT End of Session - 10/28/21 0808     Visit Number 4    Number of Visits 10    Date for PT Re-Evaluation 11/24/21    Authorization Type [employer]    Authorization - Number of Visits 10    Progress Note Due on Visit 10    PT Start Time 0800    PT Stop Time 0838    PT Time Calculation (min) 38 min    Activity Tolerance Patient tolerated treatment well;Patient limited by pain    Behavior During Therapy WFL for tasks assessed/performed              Past Medical History:  Diagnosis Date   Abdominal aortic aneurysm ([place])    REPAIRED IN 1996 BY [name]  AND HAS RECENTLY BEEN FOLLOWED BY [name]   Acquired asplenia     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 Most likely scar. Mild subendocardial ischemia. EF 71% LOW RISK.    Cervical disc disease    fracture   Chronic kidney disease    COPD (chronic obstructive pulmonary disease) ([place])    Diverticulosis    Hyperlipidemia    Hypertension    Hypothyroidism (acquired)    hypo   Myocardial infarction [facility]) Aug. 2016   TIA   Polymyalgia rheumatica ([place])    2011 Dr. Charlestine Night   Rheumatoid arthritis [facility]) 2011   Dr.Truslow; fracture knees, hands and wrists -    S/P CABG x 1 1996   CABG--LIMA-LAD for ostial LAD (not felt to be PCI amenable). EF NORMAL then; LIMA now atretic   Shortness of breath dyspnea    with exertion   Stroke [facility]) 11-2014   TIA     Urinary frequency    Past Surgical History:  Procedure Laterality Date   ABDOMINAL AORTIC ANEURYSM REPAIR  7782   Complicated by mesenteric artery stenosis and splenic artery infarction with acquired Asplenia   APPENDECTOMY     BUNIONECTOMY  07/2011   right foot   CARDIAC CATHETERIZATION  2005   (Most recent CATH) - ostial LAD lesion 20-30% (down from 90% initially). Atretic LIMA. Minimal disease the RCA and Circumflex system.   CARPAL TUNNEL RELEASE Left    CATARACT EXTRACTION Bilateral    CERVICAL SPINE SURGERY     plate 2008 Dr. Saintclair Halsted   CORONARY ARTERY BYPASS GRAFT  1996   INCLUDED AN INTERNAL MAMMARY ARTERY TO THE LAD. EF WAS NORMAL   INGUINAL HERNIA REPAIR Right    LAPAROSCOPIC APPENDECTOMY N/A 06/28/2016   Procedure: APPENDECTOMY LAPAROSCOPIC;  Surgeon: Leighton Ruff, MD;  Location: [facility];  Service: General;  Laterality: N/A;   NM MYOVIEW LTD  March 2012   Fixed anteroseptal, apical and inferoapical defect with moderate size. Most likely scar. Mild subendocardial ischemia. EF 71% LOW RISK.    SPLENECTOMY     TRANSTHORACIC ECHOCARDIOGRAM  12/2014  [facility]: Normal LV size & function. EF 55-60%,    vagina polyp     VIDEO ASSISTED THORACOSCOPY (VATS)/WEDGE RESECTION Left 03/02/2015   Procedure: VIDEO ASSISTED THORACOSCOPY (VATS), MINI THORACOTOMY, LEFT UPPER LOBE WEDGE, TAKE DOWN OF INTERNAL MAMMARY LESIONS, PLACEMENT OF ON-Q PUMP;  Surgeon: Grace Isaac, MD;  Location: [place] OR;  Service: Thoracic;  Laterality: Left;   VIDEO BRONCHOSCOPY N/A 03/02/2015   Procedure: BRONCHOSCOPY;  Surgeon: Grace Isaac, MD;  Location: [place];  Service: Thoracic;  Laterality: N/A;   VIDEO BRONCHOSCOPY WITH ENDOBRONCHIAL NAVIGATION N/A 10/08/2017   Procedure: VIDEO BRONCHOSCOPY WITH ENDOBRONCHIAL NAVIGATION WITH BIOPSIES OF LEFT UPPER LOBE AND LEFT LOWER LOBE;  Surgeon: Grace Isaac, MD;  Location: [place];  Service: Thoracic;  Laterality: N/A;   Patient Active Problem List    Diagnosis Date Noted   Primary osteoarthritis of left hip 10/06/2021   Left hip pain 12/09/2020   Aortic atherosclerosis ([place]) 09/07/2020   Gait disorder 06/08/2020   Neoplasm of uncertain behavior of skin 03/14/2020   Contusion of face 03/10/2020   Arthralgia 12/25/2019   Ankle swelling 08/08/2019   Hyperkalemia 05/15/2019   Osteoporosis 02/26/2018   Colon polyp 06/28/2016   COPD GOLD I 05/28/2016   Upper airway cough syndrome 05/22/2016   Acute respiratory failure with hypoxemia ([place]) 04/16/2016   Acute respiratory failure with hypoxia ([place]) 04/15/2016   Acute bronchitis 04/03/2016   Wrist pain, right 12/14/2015   Lung mass 03/02/2015   Pre-operative cardiovascular examination 02/21/2015   DOE (dyspnea on exertion) 02/16/2015   Solitary pulmonary nodule 02/15/2015   TIA (transient ischemic attack) 12/31/2014   Diarrhea 11/26/2014   Well adult exam 11/26/2014   Seasonal allergies 12/31/2013   Splenic artery aneurysm ([place]) 04/07/2013   Cerumen impaction 03/20/2013   Dyslipidemia, goal LDL below 70    Pernicious anemia 09/04/2012   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   Long term (current) use of anticoagulants 11/08/2010   Rheumatoid arthritis ([place]) 06/23/2010   Pain in joint 11/26/2009   ROSACEA 03/23/2008   Hypothyroidism 12/08/2006   MACULAR DEGENERATION 12/08/2006   Essential hypertension 12/08/2006   Diaphragmatic hernia 12/08/2006   CRI (chronic renal insufficiency), stage 3 (moderate) ([place]) 12/08/2006   ASPLENIA 12/08/2006   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) 12/22/1994    Class: History of   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; now atretic and significantly improved LAD lesion without intervention 12/22/1994    Class: History of    REFERRING DIAG:  M16.12 (ICD-10-CM) - Primary osteoarthritis of left hip   THERAPY DIAG:  Difficulty in walking, not elsewhere classified  Muscle  weakness (generalized)  Stiffness of left hip, not elsewhere classified  Pain in left hip  Rationale for Evaluation and Treatment Rehabilitation  PERTINENT HISTORY: Previous aortic aneurysm, CAD, previous CABG X 1, chronic kidney disease, COPD, cervical DDD, hyperlipidemia, HTN, hypothyroid, rheumatoid arthritis, previous MI and stroke, shortness of breath, previous R bunionectomy, L carpal tunnel release, R foot bunionectomy, cervical spine surgery, multiple abdominal surgeries, osteoporosis  PRECAUTIONS: none  SUBJECTIVE: Pt arriving saying not much pain unless she tries to put weight on her left leg.   PAIN:  Are you having pain? Yes: NPRS scale: 6 with weight bearing, 1 otherwise/10 Pain location: left hip Pain description: achy Aggravating factors: walking Relieving factors: sit down   OBJECTIVE: (objective measures completed at initial evaluation unless otherwise dated)  DIAGNOSTIC FINDINGS: Advanced degenerative joint disease with bone-on-bone joint space  narrowing.    PATIENT SURVEYS:  FOTO 39 (Goal 52 in 13 visits)   COGNITION:           Overall cognitive status: Within functional limits for tasks assessed                          SENSATION: No tingling or numbness   MUSCLE LENGTH: Hamstrings: Right 40 deg; Left 30 deg   LOWER EXTREMITY ROM:   Passive ROM Right eval Left eval  Hip flexion 105 50  Hip extension      Hip abduction      Hip adduction      Hip internal rotation 15 NT  Hip external rotation 32 NT  Knee flexion      Knee extension      Ankle dorsiflexion      Ankle plantarflexion      Ankle inversion      Ankle eversion       (Blank rows = not tested)   LOWER EXTREMITY MMT:   MMT Right eval Left eval  Hip flexion      Hip extension      Hip abduction 4-/5 2/5  Hip adduction      Hip internal rotation      Hip external rotation      Knee flexion      Knee extension      Ankle dorsiflexion      Ankle plantarflexion      Ankle  inversion      Ankle eversion       (Blank rows = not tested)   GAIT: Distance walked: In the clinic Assistive device utilized: Walker - 4 wheeled Level of assistance: Modified independence Comments: Uses walker to decrease L hip and groin pain       TODAY'S TREATMENT: 10/28/21:  TherEx:  Scit fit bike L3 X 6 min UE/LE Gastroc stretch on slantboard 3X30 sec Leg press machine DL 37# 2X15, then left leg only 12# 2X10 Standing calf raises/toe raises: x 20 c UE support Standing hip abd: x 15 each LE c UE support Standing hip extenion X 15 each  c UE support Seated hip add ball sq isometric 5 sec X 15 Seated LAQ 2# X 20 bilat Seated clams green X 15 Standing on Airex:  1/2 tandem stance: x 20 sec X 2 bilat Standing on airex feet apart eyes closed 20 sec X 3 Figure 4 piriformis stretch x 2 each LE hold 20 sec   10/26/21:  TherEx:  Nustep: Level 4 x 6 minutes  Sit to stand 20 inch height of mat table with UE support x 10 Standing calf raises/toe raises: x 15 c UE support Standing hip abd: x 10 each LE c UE support Standing hip extenion 2 x 10 each  c UE support Standing min squats x 10 c UE support Standing on Airex: feet apart x 20 sec, feet apart eyes closed x 20 sec Standing on Airex:  1/2 tandem stance: x 20 sec Supine trunk rotation: x 3 each holding 20 sec Standing cone taps x 20 c single UE support Bridges: 2 x 10 holding 5 sec Figure 4 piriformis stretch x 2 each LE hold 20 sec Supine STKC x 2 each LE holding 20 seconds Sidelying clams: x 15 each LE Sidelying reverse clams x 15 each LE    PATIENT EDUCATION:  Education  details: Reviewed exam findings  Person educated: Patient Education method: Explanation, Demonstration, Verbal cues, and Handouts Education comprehension: verbalized understanding, returned demonstration, verbal cues required, and needs further education     HOME EXERCISE PROGRAM: Access Code: [email or web address] URL:  [email or web address] Date: 10/13/2021 Prepared by: Vista Mink   Exercises - Supine Lower Trunk Rotation  - 2 x daily - 7 x weekly - 1 sets - 10 reps - 10 seconds hold - Clam  - 2 x daily - 7 x weekly - 1-2 sets - 10 reps - 3 seconds hold - Bridge  - 2 x daily - 7 x weekly - 1-2 sets - 10 reps - 3-5 seconds hold -added standing hip extensions and abductions X 15 1X per day on 10/28/21, Aaron Edelman)   ASSESSMENT:   CLINICAL IMPRESSION: We continued to work to improve strength, ROM, and balance within her pain tolerance level. I did add 2 exercises into her HEP which I feel will help, see above for details.      OBJECTIVE IMPAIRMENTS Abnormal gait, cardiopulmonary status limiting activity, decreased activity tolerance, decreased balance, decreased endurance, decreased knowledge of condition, decreased mobility, difficulty walking, decreased ROM, decreased strength, impaired perceived functional ability, impaired flexibility, and pain.    ACTIVITY LIMITATIONS bending, standing, squatting, stairs, transfers, bed mobility, dressing, and locomotion level   PARTICIPATION LIMITATIONS: community activity   PERSONAL FACTORS Previous aortic aneurysm, CAD, previous CABG X 1, chronic kidney disease, COPD, cervical DDD, hyperlipidemia, HTN, hypothyroid, rheumatoid arthritis, previous MI and stroke, shortness of breath, previous R bunionectomy, L carpal tunnel release, R foot bunionectomy, cervical spine surgery, multiple abdominal surgeries, osteoporosis are also affecting patient's functional outcome.    REHAB POTENTIAL: Good to meet the below listed goals.  Will likely need THA for the best functional outcome.   CLINICAL DECISION MAKING: Stable/uncomplicated   EVALUATION COMPLEXITY: Low     GOALS: Goals reviewed with patient? Yes   SHORT TERM GOALS: Target date: 11/10/2021  Improve L hip flexion AROM to at least 70 degrees Baseline: 50 degrees Goal status: Ongoing  10/26/21   2.   Improve L hip abductors strength as assessed by FOTO and WB endurance Baseline: See FOTO and limited standing and walking endurance with a walker Goal status: INITIAL     LONG TERM GOALS: Target date: 11/24/2021    Improve FOTO to 52 Baseline: 39 Goal status: INITIAL   2.  Lya will be independent with her pre-operative HEP at DC. Baseline: Started 10/13/2021 Goal status: INITIAL     PLAN: PT FREQUENCY: 1-2x/week   PT DURATION: 6 weeks   PLANNED INTERVENTIONS: Therapeutic exercises, Therapeutic activity, Neuromuscular re-education, Balance training, Gait training, Patient/Family education, Stair training, Cryotherapy, and Manual therapy   PLAN FOR NEXT SESSION: Progress hip strength abd/add, IR, and ER,  hip AROM,  Prepare for surgery. Progression of weight shifting and SLS on bil LE's.        Debbe Odea, PT, DPT 10/28/2021, 8:09 AM

## 2021-10-30 ENCOUNTER — Other Ambulatory Visit (HOSPITAL_COMMUNITY): Payer: Medicare PPO

## 2021-10-30 ENCOUNTER — Ambulatory Visit (HOSPITAL_COMMUNITY): Payer: Medicare PPO

## 2021-11-02 ENCOUNTER — Telehealth: Payer: Self-pay | Admitting: Rehabilitative and Restorative Service Providers"

## 2021-11-02 ENCOUNTER — Encounter: Payer: Medicare PPO | Admitting: Rehabilitative and Restorative Service Providers"

## 2021-11-02 ENCOUNTER — Other Ambulatory Visit: Payer: Self-pay

## 2021-11-02 DIAGNOSIS — I7779 Dissection of other artery: Secondary | ICD-10-CM

## 2021-11-02 DIAGNOSIS — I728 Aneurysm of other specified arteries: Secondary | ICD-10-CM

## 2021-11-02 DIAGNOSIS — M0579 Rheumatoid arthritis with rheumatoid factor of multiple sites without organ or systems involvement: Secondary | ICD-10-CM | POA: Diagnosis not present

## 2021-11-02 DIAGNOSIS — Z79899 Other long term (current) drug therapy: Secondary | ICD-10-CM | POA: Diagnosis not present

## 2021-11-02 NOTE — Telephone Encounter (Signed)
[name] had 2 appointments and arrived 20 minutes late.  I gave her the option of a short session or just continuing Friday.  She chose Friday.

## 2021-11-03 ENCOUNTER — Ambulatory Visit: Payer: Medicare PPO | Admitting: Internal Medicine

## 2021-11-04 ENCOUNTER — Ambulatory Visit: Payer: Medicare PPO | Admitting: Rehabilitative and Restorative Service Providers"

## 2021-11-04 ENCOUNTER — Encounter: Payer: Self-pay | Admitting: Rehabilitative and Restorative Service Providers"

## 2021-11-04 DIAGNOSIS — R262 Difficulty in walking, not elsewhere classified: Secondary | ICD-10-CM

## 2021-11-04 DIAGNOSIS — M25552 Pain in left hip: Secondary | ICD-10-CM | POA: Diagnosis not present

## 2021-11-04 DIAGNOSIS — M6281 Muscle weakness (generalized): Secondary | ICD-10-CM | POA: Diagnosis not present

## 2021-11-04 DIAGNOSIS — M25652 Stiffness of left hip, not elsewhere classified: Secondary | ICD-10-CM | POA: Diagnosis not present

## 2021-11-04 NOTE — Therapy (Signed)
OUTPATIENT PHYSICAL THERAPY TREATMENT NOTE   Patient Name: [name] MRN: [id number] DOB:09/30/1940, 81 y.o., female Today's Date: 11/04/2021  PCP: Sunday Spillers, MD REFERRING PROVIDER: Leandrew Koyanagi, MD  END OF SESSION:   PT End of Session - 11/04/21 0906     Visit Number 5    Number of Visits 10    Date for PT Re-Evaluation 11/24/21    Authorization Type [employer]    Authorization - Number of Visits 10    Progress Note Due on Visit 10    PT Start Time 0905    PT Stop Time 0955    PT Time Calculation (min) 50 min    Activity Tolerance Patient tolerated treatment well;Patient limited by pain    Behavior During Therapy WFL for tasks assessed/performed               Past Medical History:  Diagnosis Date   Abdominal aortic aneurysm ([place])    REPAIRED IN 1996 BY [name]  AND HAS RECENTLY BEEN FOLLOWED BY [name]   Acquired asplenia     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 Most likely scar. Mild subendocardial ischemia. EF 71% LOW RISK.    Cervical disc disease    fracture   Chronic kidney disease    COPD (chronic obstructive pulmonary disease) ([place])    Diverticulosis    Hyperlipidemia    Hypertension    Hypothyroidism (acquired)    hypo   Myocardial infarction [facility]) Aug. 2016   TIA   Polymyalgia rheumatica ([place])    2011 Dr. Charlestine Night   Rheumatoid arthritis [facility]) 2011   Dr.Truslow; fracture knees, hands and wrists -    S/P CABG x 1 1996   CABG--LIMA-LAD for ostial LAD (not felt to be PCI amenable). EF NORMAL then; LIMA now atretic   Shortness of breath dyspnea    with exertion   Stroke [facility]) 11-2014   TIA     Urinary frequency    Past Surgical History:  Procedure Laterality Date   ABDOMINAL AORTIC ANEURYSM REPAIR  0109   Complicated by mesenteric artery stenosis and splenic artery infarction with acquired Asplenia   APPENDECTOMY     BUNIONECTOMY  07/2011   right foot   CARDIAC CATHETERIZATION  2005   (Most recent CATH) - ostial LAD lesion 20-30% (down from 90% initially). Atretic LIMA. Minimal disease the RCA and Circumflex system.   CARPAL TUNNEL RELEASE Left    CATARACT EXTRACTION Bilateral    CERVICAL SPINE SURGERY     plate 2008 Dr. Saintclair Halsted   CORONARY ARTERY BYPASS GRAFT  1996   INCLUDED AN INTERNAL MAMMARY ARTERY TO THE LAD. EF WAS NORMAL   INGUINAL HERNIA REPAIR Right    LAPAROSCOPIC APPENDECTOMY N/A 06/28/2016   Procedure: APPENDECTOMY LAPAROSCOPIC;  Surgeon: Leighton Ruff, MD;  Location: [facility];  Service: General;  Laterality: N/A;   NM MYOVIEW LTD  March 2012   Fixed anteroseptal, apical and inferoapical defect with moderate size. Most likely scar. Mild subendocardial ischemia. EF 71% LOW RISK.    SPLENECTOMY     TRANSTHORACIC ECHOCARDIOGRAM  12/2014  [facility]: Normal LV size & function. EF 55-60%,    vagina polyp     VIDEO ASSISTED THORACOSCOPY (VATS)/WEDGE RESECTION Left 03/02/2015   Procedure: VIDEO ASSISTED THORACOSCOPY (VATS), MINI THORACOTOMY, LEFT UPPER LOBE WEDGE, TAKE DOWN OF INTERNAL MAMMARY LESIONS, PLACEMENT OF ON-Q PUMP;  Surgeon: Grace Isaac, MD;  Location: [place] [facility];  Service: Thoracic;  Laterality: Left;   VIDEO BRONCHOSCOPY N/A 03/02/2015   Procedure: BRONCHOSCOPY;  Surgeon: Grace Isaac, MD;  Location: [facility];  Service: Thoracic;  Laterality: N/A;   VIDEO BRONCHOSCOPY WITH ENDOBRONCHIAL NAVIGATION N/A 10/08/2017   Procedure: VIDEO BRONCHOSCOPY WITH ENDOBRONCHIAL NAVIGATION WITH BIOPSIES OF LEFT UPPER LOBE AND LEFT LOWER LOBE;  Surgeon: Grace Isaac, MD;  Location: [facility];  Service: Thoracic;  Laterality: N/A;   Patient Active Problem List    Diagnosis Date Noted   Primary osteoarthritis of left hip 10/06/2021   Left hip pain 12/09/2020   Aortic atherosclerosis ([place]) 09/07/2020   Gait disorder 06/08/2020   Neoplasm of uncertain behavior of skin 03/14/2020   Contusion of face 03/10/2020   Arthralgia 12/25/2019   Ankle swelling 08/08/2019   Hyperkalemia 05/15/2019   Osteoporosis 02/26/2018   Colon polyp 06/28/2016   COPD GOLD I 05/28/2016   Upper airway cough syndrome 05/22/2016   Acute respiratory failure with hypoxemia ([place]) 04/16/2016   Acute respiratory failure with hypoxia ([place]) 04/15/2016   Acute bronchitis 04/03/2016   Wrist pain, right 12/14/2015   Lung mass 03/02/2015   Pre-operative cardiovascular examination 02/21/2015   DOE (dyspnea on exertion) 02/16/2015   Solitary pulmonary nodule 02/15/2015   TIA (transient ischemic attack) 12/31/2014   Diarrhea 11/26/2014   Well adult exam 11/26/2014   Seasonal allergies 12/31/2013   Splenic artery aneurysm ([place]) 04/07/2013   Cerumen impaction 03/20/2013   Dyslipidemia, goal LDL below 70    Pernicious anemia 09/04/2012   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   Long term (current) use of anticoagulants 11/08/2010   Rheumatoid arthritis ([place]) 06/23/2010   Pain in joint 11/26/2009   ROSACEA 03/23/2008   Hypothyroidism 12/08/2006   MACULAR DEGENERATION 12/08/2006   Essential hypertension 12/08/2006   Diaphragmatic hernia 12/08/2006   CRI (chronic renal insufficiency), stage 3 (moderate) ([place]) 12/08/2006   ASPLENIA 12/08/2006   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) 12/22/1994    Class: History of   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; now atretic and significantly improved LAD lesion without intervention 12/22/1994    Class: History of    REFERRING DIAG:  M16.12 (ICD-10-CM) - Primary osteoarthritis of left hip   THERAPY DIAG:  Difficulty in walking, not elsewhere classified  Muscle  weakness (generalized)  Stiffness of left hip, not elsewhere classified  Pain in left hip  Rationale for Evaluation and Treatment Rehabilitation  PERTINENT HISTORY: Previous aortic aneurysm, CAD, previous CABG X 1, chronic kidney disease, COPD, cervical DDD, hyperlipidemia, HTN, hypothyroid, rheumatoid arthritis, previous MI and stroke, shortness of breath, previous R bunionectomy, L carpal tunnel release, R foot bunionectomy, cervical spine surgery, multiple abdominal surgeries, osteoporosis  PRECAUTIONS: none  SUBJECTIVE: Tasmine reports she is ready to have her L hip replaced "yesterday."  We discussed reassessment next week.  PAIN:  Are you having pain? Yes: NPRS scale: 8/10 with weight bearing, 2/10 otherwise on a 10/10 Pain location: left hip Pain description: achy Aggravating factors: walking, standing Relieving factors: sit down, lie down, change position   OBJECTIVE: (objective  measures completed at initial evaluation unless otherwise dated)  DIAGNOSTIC FINDINGS: Advanced degenerative joint disease with bone-on-bone joint space  narrowing.    PATIENT SURVEYS:  FOTO 39 (Goal 52 in 13 visits)   COGNITION:           Overall cognitive status: Within functional limits for tasks assessed                          SENSATION: No tingling or numbness   MUSCLE LENGTH: Hamstrings: Right 40 deg; Left 30 deg   LOWER EXTREMITY ROM:   Passive ROM Right eval Left eval  Hip flexion 105 50  Hip extension      Hip abduction      Hip adduction      Hip internal rotation 15 NT  Hip external rotation 32 NT  Knee flexion      Knee extension      Ankle dorsiflexion      Ankle plantarflexion      Ankle inversion      Ankle eversion       (Blank rows = not tested)   LOWER EXTREMITY MMT:   MMT Right eval Left eval  Hip flexion      Hip extension      Hip abduction 4-/5 2/5  Hip adduction      Hip internal rotation      Hip external rotation      Knee flexion      Knee  extension      Ankle dorsiflexion      Ankle plantarflexion      Ankle inversion      Ankle eversion       (Blank rows = not tested)   GAIT: Distance walked: In the clinic Assistive device utilized: Walker - 4 wheeled Level of assistance: Modified independence Comments: Uses walker to decrease L hip and groin pain       TODAY'S TREATMENT: 11/04/2021: Pelvic rotation 20X 10 seconds Bridging 10X 5 seconds Side lie clams (lie R, lift L) 2 sets of 10 3 seconds Reverse clams 10X 3 seconds Lumbar extension AROM 10X 3 seconds Shoulder blade pinches 10X 5 seconds NuStep  8 minutes Level 4  Functional Activities (for sit to stand and WB function): Leg Press double leg slow eccentrics 25X 37# Leg Press single leg press 15X 12#   10/28/21:  TherEx:  Scit fit bike L3 X 6 min UE/LE Gastroc stretch on slantboard 3X30 sec Leg press machine DL 37# 2X15, then left leg only 12# 2X10 Standing calf raises/toe raises: x 20 c UE support Standing hip abd: x 15 each LE c UE support Standing hip extenion X 15 each  c UE support Seated hip add ball sq isometric 5 sec X 15 Seated LAQ 2# X 20 bilat Seated clams green X 15 Standing on Airex:  1/2 tandem stance: x 20 sec X 2 bilat Standing on airex feet apart eyes closed 20 sec X 3 Figure 4 piriformis stretch x 2 each LE hold 20 sec   10/26/21:  TherEx:  Nustep: Level 4 x 6 minutes  Sit to stand 20 inch height of mat table with UE support x 10 Standing calf raises/toe raises: x 15 c UE support Standing hip abd: x 10 each LE c UE support Standing hip extenion 2 x 10 each  c UE support Standing min squats x 10 c UE support Standing on Airex: feet apart x 20 sec,  feet apart eyes closed x 20 sec Standing on Airex:  1/2 tandem stance: x 20 sec Supine trunk rotation: x 3 each holding 20 sec Standing cone taps x 20 c single UE support Bridges: 2 x 10 holding 5 sec Figure 4 piriformis stretch x 2 each LE hold 20 sec Supine STKC x 2 each LE holding  20 seconds Sidelying clams: x 15 each LE Sidelying reverse clams x 15 each LE    PATIENT EDUCATION:  Education details: Reviewed exam findings  Person educated: Patient Education method: Consulting civil engineer, Demonstration, Verbal cues, and Handouts Education comprehension: verbalized understanding, returned demonstration, verbal cues required, and needs further education     HOME EXERCISE PROGRAM: Access Code: [email or web address] URL: [email or web address] Date: 11/04/2021 Prepared by: Vista Mink  Exercises - Supine Lower Trunk Rotation with PLB  - 2 x daily - 7 x weekly - 1 sets - 10 reps - 10 seconds hold - Clam  - 2 x daily - 7 x weekly - 1-2 sets - 10 reps - 3 seconds hold - Bridge  - 2 x daily - 7 x weekly - 1-2 sets - 10 reps - 3-5 seconds hold - Standing Lumbar Extension at [place]  - 5 x daily - 7 x weekly - 1 sets - 5 reps - 3 seconds hold - Standing Scapular Retraction  - 5 x daily - 7 x weekly - 1 sets - 5 reps - 5 second hold   ASSESSMENT:   CLINICAL IMPRESSION: Strength remains a high priority in preparation for likely THA.  Rotha and I discussed that we will reassess next week to update her HEP and send a progress note to Dr. Erlinda Hong.  Reassess/progress note and FOTO next week.     OBJECTIVE IMPAIRMENTS Abnormal gait, cardiopulmonary status limiting activity, decreased activity tolerance, decreased balance, decreased endurance, decreased knowledge of condition, decreased mobility, difficulty walking, decreased ROM, decreased strength, impaired perceived functional ability, impaired flexibility, and pain.    ACTIVITY LIMITATIONS bending, standing, squatting, stairs, transfers, bed mobility, dressing, and locomotion level   PARTICIPATION LIMITATIONS: community activity   PERSONAL FACTORS Previous aortic aneurysm, CAD, previous CABG X 1, chronic kidney disease, COPD, cervical DDD, hyperlipidemia, HTN, hypothyroid, rheumatoid arthritis, previous MI and stroke,  shortness of breath, previous R bunionectomy, L carpal tunnel release, R foot bunionectomy, cervical spine surgery, multiple abdominal surgeries, osteoporosis are also affecting patient's functional outcome.    REHAB POTENTIAL: Good to meet the below listed goals.  Will likely need THA for the best functional outcome.   CLINICAL DECISION MAKING: Stable/uncomplicated   EVALUATION COMPLEXITY: Low     GOALS: Goals reviewed with patient? Yes   SHORT TERM GOALS: Target date: 11/10/2021  Improve L hip flexion AROM to at least 70 degrees Baseline: 50 degrees Goal status: Ongoing  10/26/21   2.  Improve L hip abductors strength as assessed by FOTO and WB endurance Baseline: See FOTO and limited standing and walking endurance with a walker Goal status: INITIAL     LONG TERM GOALS: Target date: 11/24/2021    Improve FOTO to 52 Baseline: 39 Goal status: INITIAL   2.  Neyla will be independent with her pre-operative HEP at DC. Baseline: Started 10/13/2021 Goal status: INITIAL     PLAN: PT FREQUENCY: 1-2x/week   PT DURATION: 6 weeks   PLANNED INTERVENTIONS: Therapeutic exercises, Therapeutic activity, Neuromuscular re-education, Balance training, Gait training, Patient/Family education, Stair training, Cryotherapy, and Manual therapy   PLAN FOR NEXT SESSION: Progress hip strength  abd/add, IR, and ER,  hip AROM,  Prepare for surgery.  Progress note with FOTO.       Farley Ly, PT, MPT 11/04/2021, 9:54 AM

## 2021-11-06 NOTE — Progress Notes (Unsigned)
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 Pt reports some dyspnea with exertion, some morning dry to wet cough. Pt does not use any inhalers.   Cough better but some cc am congestion x years ? Worse in winter > total ? Usually swallows so can't say color or amt assoc with mild pnds  Doe only with exertion = MMRC1 = can walk nl pace, flat grade, can't hurry or go uphills or steps s mild sob  rec Please remember to go to the lab  department downstairs for your tests - we will call you with the results when they are available. 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 No new co's.  doe = MMRC1 = can walk nl pace, flat grade, can't hurry or go uphills or steps s sob   Only a little drainage p one chlorpheniramine in am and double strength at hs and sleep fine now rec Continue protonix and pepcid for another 2 months then fine to try to taper  off by stopping the protonix first and changing the pepcid 20  mg twice daily then taper the pepcid off as well and see if cough flares or not - if does flare, restart and you may need GI evaluation  For drainage / throat tickle try take CHLORPHENIRAMINE  4 mg - take one every 4 hours as needed - available over the counter- may cause drowsiness so start with just a bedtime dose or two and see how you tolerate it bentin 100 mg three times daily  efore trying in daytime   If not satisfied we start you on gabap   10/08/17  Excisional bx LUL c/w nec granuloma    01/14/2018  f/u ov/[name] re:  GOLD I copd with reversible component . Did not take the prednisone  Chief Complaint  Patient presents with   Follow-up    PFT's done today. Her cough and SOB are about the same. No new co's.   Dyspnea:  MMRC1 = can walk nl pace, flat grade, can't hurry or go uphills or steps s sob   Cough: more of throat clearing/ daytime x years / p  rx fosfamax >>  worse since then  Sleeping: flat bed / one pillow SABA use: none 02: none  rec Start  BREO 096 one click each am x 2 drags each am  Try off fosfamax for now  Please schedule a follow up office visit in 6 weeks, call sooner if needed      02/06/2019  f/u ov/[name] re:  Gradually downhill back on biphosphonates /Breo with only GOLD I criteria previously  Chief Complaint  Patient presents with   Follow-up    Breathing is no better since last visit. She is getting SOB with exertion such as just standing up. She rarely uses her albuterol inhaler, does not feel it helps. She has non prod cough.   Dyspnea:  MMRC3 = can't walk 100 yards even at a slow pace at a flat grade s stopping due to sob   Cough: worse in fall and winter but day > noct with sensation of globus Sleeping: flat / one pillow no resp symptoms noct  SABA use: rarely, not helping  02: none  rec Pantoprazole (protonix) 40 mg   Take  30-60 min before first meal of the day and Pepcid (famotidine)  20 mg one after supper  until return to office - this is the best  way to tell whether stomach acid is contributing to your problem and stop the risedronate for now  GERD diet  Prednisone 10 mg take  4 each am x 2 days,   2 each am x 2 days,  1 each am x 2 days and stop  Stop breo and start symbicort 80 Take 2 puffs first thing in am and then another 2 puffs about 12 hours later      02/21/2019  f/u ov/[name] re: GOLD I copd, worsening sob  Chief Complaint  Patient presents with   Follow-up    Breathing is unchanged. She rarely uses her albuterol inhaler.   Dyspnea:  Room to room but also limited by L hip pain  Cough: all day sensation of globus but no mucus x years  Sleeping: flat/ one pillow  SABA use: used once and no better  02: none rec Stop symbicort to see what difference if any it makes Gabapentin is 100 mg three times daily  Stop losartan and start metaprolol 25 mg twice daily           09/26/2019  f/u ov/[name] re: GOLD I copd/recurrent cough /sob off gabapentin  Chief Complaint  Patient presents with   Follow-up    pt states sob all times.pt states not using rescue inhaler  Dyspnea:  One hall at a time  Then has to stop at [facility]  Cough: worse off gabapentin  Then ran out of h1 and flared / more yellow /productive in am/ convinced it's due to "drainage"  Sleeping: denies resp symptoms   Waking her  up  SABA use: doesn't help 02: no   rec Gabapentin 100 mg three times a day and if not satisfied with the cough and sensation of too much throat mucus  We will call to schedule a sinus CT Please remember to go to the  x-ray department  for your tests - we will call you with the results when they are available   12/04/2019  f/u ov/[name] re:  Copd I / uacs  Chief Complaint  Patient presents with   Follow-up    Breathing is about the same. Cough has been worse- prod but unsure of sputum color. She has not been using her albuterol inhaler.   Dyspnea:  One hallway and stops  Cough: sounds  juicier in  am but nothing comes up  Sleeping:  typically not waking her  SABA use: none 02: none  rec Stiolto  2 pffs first thing in am and you should  breathe better within a few days to a week and call for refill  Gabapentin 100 mg x 2 x three times a day for at least a week and then start gabapentin 300 three times a day    01/29/2020  f/u ov/[name] re: COPD I / uacs / not able to remember lunch time gabapentin Chief Complaint  Patient presents with   Follow-up    Breathing is overall doing well. She states does have some "congestion and a frog in my throat". She has not had to use her albuterol inhaler since her last visit.   Dyspnea: more limited by legs than sob  Cough: frog in throat and dry mouth since stiolto 2 each am but thinks breathing better  Sleeping: bed is flat SABA use: none 02: none  rec Try to reduce stiolto to one puff each am to see if helps your dry mouth (think of like high octane fuel ok to adjust up down or off) Ok to spread out the gabapentin 300 mg three times a day If throat problems continue you will need to be referred to [facility] Dr Carol Ada     11/04/2020  f/u ov/[name] re:  UACS/ GOLD I/ RA  stiolto maint one puff daily and gabapentin 300 tid  No chief complaint on file. Dyspnea:  walking halls at [facility] x 10 min then tires Cough: none and wants to try off gerd rx  Sleeping: no resp cc prone bed flat SABA use: none  02: none  Covid status:   vax x 4 / never infected  Rec Stop pantoprazole and Try pepcid 20 mg after breakfast and supper x 2 weeks, then just after supper x 2 weeks then stop If cough or breathing get worse, resume:  Pantoprazole (protonix) 40 mg   Take  30-60 min before first meal of the day and Pepcid (famotidine)  20 mg after supper until return to office    11/07/2021  f/u ov/[name] re: UACS / GOLD 1 / RA [place]   maint on  stiolto one each am/Arava   Chief Complaint  Patient presents with   Follow-up    Pt states she has experienced some SOB.    Dyspnea:   really limited by L hip pain / plans L THR Cough: none  Sleeping: no resp cc  SABA use: not using  02: none  Covid status:   vax x 4 / never infected    No obvious day to day or daytime variability or assoc excess/ purulent sputum or mucus plugs or hemoptysis or cp or chest tightness, subjective wheeze or overt sinus or hb symptoms.   Sleeping  without nocturnal  or early am exacerbation  of respiratory  c/o's or need for noct saba. Also denies any obvious fluctuation of symptoms with weather or environmental changes or other aggravating or alleviating factors except as outlined above   No unusual exposure hx or h/o childhood pna/ asthma or knowledge of premature birth.  Current Allergies, Complete Past Medical History, Past Surgical History, Family History, and Social History were reviewed in [employer] record.  ROS  The following are not active complaints unless bolded Hoarseness, sore throat, dysphagia, dental problems, itching, sneezing,  nasal congestion or discharge of excess mucus or purulent secretions, ear  ache,   fever, chills, sweats, unintended wt loss or wt gain, classically pleuritic or exertional cp,  orthopnea pnd or arm/hand swelling  or leg swelling, presyncope, palpitations, abdominal pain, anorexia, nausea, vomiting, diarrhea  or change in bowel habits or change in bladder habits, change in stools or change in urine, dysuria, hematuria,  rash, arthralgias, visual complaints, headache, numbness, weakness or ataxia or problems with walking uses rollator or coordination,  change in mood or  memory.        Current Meds  Medication Sig   acetaminophen (TYLENOL) 500 MG tablet Take 500 mg by mouth every 6 (six) hours as needed (pain).    albuterol (VENTOLIN HFA) 108 (90 Base) MCG/ACT inhaler Inhale 2 puffs into the lungs every 6 (six) hours as needed for wheezing or shortness of breath.   amLODipine (NORVASC) 2.5 MG tablet Take 1 tablet (2.5 mg total) by  mouth daily.   atorvastatin (LIPITOR) 80 MG tablet TAKE 1 TABLET EVERY DAY   Chlorpheniramine Maleate (CHLORPHEN PO) Take by mouth.   Cholecalciferol (VITAMIN D3) 50 MCG (2000 UT) capsule Take 1 capsule (2,000 Units total) by mouth daily.   clopidogrel (PLAVIX) 75 MG tablet TAKE 1 TABLET EVERY DAY   Cyanocobalamin (B-12) 2500 MCG TABS one po qd   famotidine (PEPCID) 20 MG tablet TAKE 1 TABLET AT BEDTIME   gabapentin (NEURONTIN) 300 MG capsule TAKE 1 CAPSULE BY MOUTH THREE TIMES A DAY   Golimumab (SIMPONI [place]) Every other month per Dr Trudie Reed   hydrochlorothiazide (HYDRODIURIL) 12.5 MG tablet TAKE 1 TABLET BY MOUTH EVERY DAY   leflunomide (ARAVA) 20 MG tablet Take 20 mg by mouth daily.    levothyroxine (SYNTHROID) 88 MCG tablet Take 1 tablet (88 mcg total) by mouth daily before breakfast.   loperamide (IMODIUM A-D) 2 MG tablet Take 2 mg by mouth 4 (four) times daily as needed for diarrhea or loose stools.   losartan (COZAAR) 50 MG tablet TAKE 1 TABLET EVERY DAY (STOP LOSARTAN 100 MG)   methylPREDNISolone (MEDROL DOSEPAK) 4 MG TBPK tablet As directed   metoprolol tartrate (LOPRESSOR) 50 MG tablet TAKE 1 AND 1/2 TABLETS TWICE DAILY   pantoprazole (PROTONIX) 40 MG tablet TAKE 1 TABLET DAILY. TAKE 30 TO 60 MINUTES BEFORE FIRST MEAL OF THE DAY   psyllium (METAMUCIL) 58.6 % packet Take 1 packet by mouth daily.   Tiotropium Bromide-Olodaterol (STIOLTO RESPIMAT) 2.5-2.5 MCG/ACT AERS INHALE 2 PUFFS BY MOUTH INTO THE LUNGS DAILY   triamcinolone (NASACORT) 55 MCG/ACT AERO nasal inhaler Place 1 spray into the nose daily.                    Objective:   Physical Exam  wts  11/07/2021          152    11/04/2020           157  05/04/2020          153  01/29/2020         150  12/04/2019           148 09/26/2019         146  03/20/2019       151  02/21/2019       148 02/06/2019         148 04/30/2018      148 01/14/2018        144  12/03/2017          147  09/26/2016  141  08/07/2016          146   06/19/2016        152  05/22/2016        148   02/15/15 155 lb 6.4 oz (70.489 kg)  01/29/15 154 lb (69.854 kg)  11/26/14 153 lb 8 oz (69.627 kg)     Vital signs reviewed  11/07/2021  - Note at rest 02 sats  95% on RA   General appearance:    amb somber wf walks with rollator    HEENT : Oropharynx  clear   Nasal turbinates nl    NECK :  without  apparent JVD/ palpable Nodes/TM    LUNGS: no acc muscle use,  Min barrel  contour chest wall with bilateral  slightly decreased bs s audible wheeze and  without cough on insp or exp maneuvers and min  Hyperresonant  to  percussion bilaterally    CV:  RRR  no s3 or murmur or increase in P2, and no edema   ABD:  soft and nontender with pos end  insp Hoover's  in the supine position.  No bruits or organomegaly appreciated   MS:  ext warm without deformities Or obvious joint restrictions  calf tenderness, cyanosis or clubbing     SKIN: warm and dry without lesions    NEURO:  alert, approp, nl sensorium with  no motor or cerebellar deficits apparent.            Assessment & Plan:

## 2021-11-07 ENCOUNTER — Encounter: Payer: Self-pay | Admitting: Internal Medicine

## 2021-11-07 ENCOUNTER — Ambulatory Visit: Payer: Medicare PPO | Admitting: Internal Medicine

## 2021-11-07 DIAGNOSIS — J449 Chronic obstructive pulmonary disease, unspecified: Secondary | ICD-10-CM

## 2021-11-07 DIAGNOSIS — R058 Other specified cough: Secondary | ICD-10-CM | POA: Diagnosis not present

## 2021-11-07 DIAGNOSIS — R0609 Other forms of dyspnea: Secondary | ICD-10-CM | POA: Diagnosis not present

## 2021-11-07 MED ORDER — ALBUTEROL SULFATE HFA 108 (90 BASE) MCG/ACT IN AERS: 2.0000 | INHALATION_SPRAY | RESPIRATORY_TRACT | 2 refills | Status: DC | PRN

## 2021-11-07 NOTE — Assessment & Plan Note (Addendum)
Quit smoking in her 92s - PFT's  02/19/15   FEV1 1.78 (90 % ) ratio 66  p 12 % improvement from saba p nothing  prior to study with DLCO  71 % corrects to 78 % for alv volume  And classical curvature  - Spirometry 06/19/2016  FEV1 1.65  (84%)  Ratio 69  With no prior rx  - PFT's  01/14/2018  FEV1 1.68 (90 % ) ratio 68  p 18 % improvement from saba p nothing  prior to study with DLCO  84 % corrects to 90  % for alv volume  - 01/14/2018    try BREO 100 each am > worse 02/06/2019 so d/c'd - 02/06/2019  After extensive coaching inhaler device,  effectiveness =   75% with hfa > change to symb 80 2bid and add spacer next ov if uacs component not improved  - 02/06/2019   Walked RA x one lap =  approx 250 ft - stopped due to  Sob with sats 95% avg pace  - 02/21/2019   Walked RA  2 laps @  approx 275f each @ slow pace  stopped due to tired more than sob / sats 90%   - PFT's  09/18/19   FEV1 1.83 (101 % ) ratio 0.71  p 15 % improvement from saba p nothing prior to study with DLCO  17.91 (98%) corrects to 3.74 (91 %)  for alv volume and FV curve mild concavity on exp portion   09/26/2019   Walked RA 3 laps @ approx 2565feach @  Slow pace  stopped due sob several times  with sats 97% or higher - 12/04/2019  After extensive coaching inhaler device,  effectiveness =    75% with smi   - 05/04/2020 try stiolto one each am for throat discomfort despite 300 tid of gabapentin  > improved as of 11/04/2020 on stiolto one daily s increased doe  - 11/07/2021  The proper method of use, as well as anticipated side effects, of a metered-dose inhaler were discussed and demonstrated to the patient using teach back method. Improved effectiveness after extensive coaching during this visit to a level of approximately 80% from a baseline of 50% % (late inhalation)   - 11/07/2021   Walked on RA   x  3  lap(s) =  approx 750  ft  @ rollator pace, stopped due to end of study  with lowest 02 sats 95%   Pt is Group B in terms of symptom/risk and  laba/lama therefore appropriate rx at this point >>>  stiolto one or two puffs each am and approp saba   Ok for L hip surgery unless change in doe or new cough in the meantime   F/u can be yearly, sooner prn

## 2021-11-07 NOTE — Patient Instructions (Signed)
Ok to increase stiolto to 2 puffs in am if limited by your breathing from desired activities   We will walk today for a baseline 02 saturation with exertion to clear you for your surgery   Please schedule a follow up visit in 12 months but call sooner if needed

## 2021-11-07 NOTE — Assessment & Plan Note (Signed)
Onset around 2008 - Allergy profile 05/22/2016 >  Eos 0.3 /  IgE  10 neg rast - singulair / gerd rx started 05/22/2016 >>> still with pnds 06/19/2016 > add dymista / 1st gen H1> no better but never took h1 so rec stop dymista  - no better 08/07/2016 on singulair/ anoro > rec stop both and just use prn saba/ h1 > improved 09/26/2016  - restarted gerd rx and 1st gen H1 blockers per guidelines  12/03/2017  - rec d/c fosfamax x 6 weeks 01/14/2018 > resolved 02/25/2018 so ok to rechallenge with fosfamax 02/25/2018 and flared again at ov 04/30/2018 so rec substitute prolia or Reclast  - 02/06/2019 flared again on biphosphonates > d/c permanently, consider reclast or prolia  - 02/21/2019 added gabapentin 100 tid for globus and stop losartan >>>>  50% better 03/20/2019 so rec titrate up to 300 mg tid if tolerates (did not do) - Sinus CT 10/08/2019 >>>  Nl  - 12/04/2019 titrate up to 300 mg gabapentin to tid > only taking bid as of 01/29/2020 so rec bfast, supper, bedtime dosing  > improved as of 11/04/2020 and pt requesting off gerd rx > taper over one month > did not do as of 11/07/2021    >>> 11/07/2021 taking gabapenitn 300 mg bid with good control > continue with yearly f/u, sooner if needed    Each maintenance medication was reviewed in detail including emphasizing most importantly the difference between maintenance and prns and under what circumstances the prns are to be triggered using an action plan format where appropriate.  Total time for H and P, chart review, counseling, reviewing smi/hfa device(s) , directly observing portions of ambulatory 02 saturation study/ and generating customized AVS unique to this office visit / same day charting > 30 min yearly f/u ov and clearance for hip surgery

## 2021-11-09 ENCOUNTER — Encounter: Payer: Self-pay | Admitting: Rehabilitative and Restorative Service Providers"

## 2021-11-09 ENCOUNTER — Ambulatory Visit: Payer: Medicare PPO | Admitting: Rehabilitative and Restorative Service Providers"

## 2021-11-09 DIAGNOSIS — M25652 Stiffness of left hip, not elsewhere classified: Secondary | ICD-10-CM

## 2021-11-09 DIAGNOSIS — R262 Difficulty in walking, not elsewhere classified: Secondary | ICD-10-CM | POA: Diagnosis not present

## 2021-11-09 DIAGNOSIS — M6281 Muscle weakness (generalized): Secondary | ICD-10-CM

## 2021-11-09 DIAGNOSIS — M25552 Pain in left hip: Secondary | ICD-10-CM | POA: Diagnosis not present

## 2021-11-09 NOTE — Therapy (Signed)
OUTPATIENT PHYSICAL THERAPY TREATMENT/PROGRESS NOTE   Patient Name: [name] MRN: [id number] DOB:01/06/1941, 81 y.o., female Today's Date: 11/09/2021  PCP: Sunday Spillers, MD REFERRING PROVIDER: Leandrew Koyanagi, MD  END OF SESSION:   PT End of Session - 11/09/21 1307     Visit Number 6    Number of Visits 10    Date for PT Re-Evaluation 11/24/21    Authorization Type [employer]    Authorization - Number of Visits 10    Progress Note Due on Visit 10    PT Start Time 0355    PT Stop Time 1342    PT Time Calculation (min) 43 min    Activity Tolerance Patient tolerated treatment well;Patient limited by pain    Behavior During Therapy [facility] for tasks assessed/performed            Progress Note Reporting Period 10/13/2021 to 11/09/2021  See note below for Objective Data and Assessment of Progress/Goals.    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 Most likely scar. Mild subendocardial ischemia. EF 71% LOW RISK.    Cervical disc disease    fracture   Chronic kidney disease    COPD (chronic obstructive pulmonary disease) ([place])    Diverticulosis    Hyperlipidemia    Hypertension    Hypothyroidism (acquired)    hypo   Myocardial infarction [facility]) Aug. 2016   TIA   Polymyalgia rheumatica ([place])    2011 Dr. Charlestine Night   Rheumatoid arthritis [facility]) 2011   Dr.Truslow; fracture knees, hands and wrists -    S/P CABG x 1 1996   CABG--LIMA-LAD for ostial LAD (not felt to  be PCI amenable). EF NORMAL then; LIMA now atretic   Shortness of breath dyspnea    with exertion   Stroke [facility]) 11-2014   TIA    Urinary frequency    Past Surgical History:  Procedure Laterality Date   ABDOMINAL AORTIC ANEURYSM REPAIR  9741   Complicated by mesenteric artery stenosis and splenic artery infarction with acquired Asplenia   APPENDECTOMY     BUNIONECTOMY  07/2011   right foot   CARDIAC CATHETERIZATION  2005   (Most recent CATH) - ostial LAD lesion 20-30% (down from 90% initially). Atretic LIMA. Minimal disease the RCA and Circumflex system.   CARPAL TUNNEL RELEASE Left    CATARACT EXTRACTION Bilateral    CERVICAL SPINE SURGERY     plate 2008 Dr. Saintclair Halsted   CORONARY ARTERY BYPASS GRAFT  1996   INCLUDED AN INTERNAL MAMMARY ARTERY TO THE LAD. EF WAS NORMAL   INGUINAL HERNIA REPAIR Right    LAPAROSCOPIC APPENDECTOMY N/A 06/28/2016   Procedure: APPENDECTOMY LAPAROSCOPIC;  Surgeon: Leighton Ruff, MD;  Location: [facility];  Service: General;  Laterality: N/A;   NM MYOVIEW LTD  March 2012   Fixed anteroseptal, apical and inferoapical defect with moderate size. Most likely scar. Mild  subendocardial ischemia. EF 71% LOW RISK.    SPLENECTOMY     TRANSTHORACIC ECHOCARDIOGRAM  12/2014   [facility] [address] [address]: Normal LV size & function. EF 55-60%,    vagina polyp     VIDEO ASSISTED THORACOSCOPY (VATS)/WEDGE RESECTION Left 03/02/2015   Procedure: VIDEO ASSISTED THORACOSCOPY (VATS), MINI THORACOTOMY, LEFT UPPER LOBE WEDGE, TAKE DOWN OF INTERNAL MAMMARY LESIONS, PLACEMENT OF ON-Q PUMP;  Surgeon: Grace Isaac, MD;  Location: [place] OR;  Service: Thoracic;  Laterality: Left;   VIDEO BRONCHOSCOPY N/A 03/02/2015   Procedure: BRONCHOSCOPY;  Surgeon: Grace Isaac, MD;  Location: [place];  Service: Thoracic;  Laterality: N/A;   VIDEO BRONCHOSCOPY WITH ENDOBRONCHIAL NAVIGATION N/A 10/08/2017   Procedure: VIDEO BRONCHOSCOPY WITH ENDOBRONCHIAL NAVIGATION WITH BIOPSIES OF LEFT UPPER LOBE AND LEFT LOWER  LOBE;  Surgeon: Grace Isaac, MD;  Location: [place];  Service: Thoracic;  Laterality: N/A;   Patient Active Problem List   Diagnosis Date Noted   Primary osteoarthritis of left hip 10/06/2021   Left hip pain 12/09/2020   Aortic atherosclerosis ([place]) 09/07/2020   Gait disorder 06/08/2020   Neoplasm of uncertain behavior of skin 03/14/2020   Contusion of face 03/10/2020   Arthralgia 12/25/2019   Ankle swelling 08/08/2019   Hyperkalemia 05/15/2019   Osteoporosis 02/26/2018   Colon polyp 06/28/2016   COPD GOLD I 05/28/2016   Upper airway cough syndrome 05/22/2016   Acute respiratory failure with hypoxemia ([place]) 04/16/2016   Acute respiratory failure with hypoxia (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) 06/23/2010   Pain in joint 11/26/2009   ROSACEA 03/23/2008   Hypothyroidism 12/08/2006   MACULAR DEGENERATION 12/08/2006   Essential hypertension 12/08/2006   Diaphragmatic hernia 12/08/2006   CRI (chronic renal insufficiency), stage 3 (moderate) ([place]) 12/08/2006   ASPLENIA 12/08/2006   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) 12/22/1994    Class: History of   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; now atretic and significantly improved LAD lesion without intervention 12/22/1994    Class: History of    REFERRING DIAG:  M16.12  (ICD-10-CM) - Primary osteoarthritis of left hip   THERAPY DIAG:  Difficulty in walking, not elsewhere classified  Muscle weakness (generalized)  Stiffness of left hip, not elsewhere classified  Pain in left hip  Rationale for Evaluation and Treatment Rehabilitation  PERTINENT HISTORY: Previous aortic aneurysm, CAD, previous CABG X 1, chronic kidney disease, COPD, cervical DDD, hyperlipidemia, HTN, hypothyroid, rheumatoid arthritis, previous MI and stroke, shortness of breath, previous R bunionectomy, L carpal tunnel release, R foot bunionectomy, cervical spine surgery, multiple abdominal surgeries, osteoporosis  PRECAUTIONS: none  SUBJECTIVE: Blannie reports she is still ready to have her L hip replaced "yesterday."  We discussed reassessment and HEP review for today.  PAIN:  Are you having pain? Yes: NPRS scale: 8/10 with weight bearing, 2/10 otherwise on a  10/10 Pain location: left hip Pain description: achy Aggravating factors: walking, standing Relieving factors: sit down, lie down, change position   OBJECTIVE: (objective measures completed at initial evaluation unless otherwise dated)  DIAGNOSTIC FINDINGS: Advanced degenerative joint disease with bone-on-bone joint space  narrowing.    PATIENT SURVEYS:  7/12/ 2023 FOTO 41 (Goal 52, was 39) 10/13/2021 FOTO 39 (Goal 52 in 13 visits)   COGNITION:           Overall cognitive status: Within functional limits for tasks assessed                          SENSATION: No tingling or numbness   MUSCLE LENGTH: 11/09/2021 Hamstrings: Right 40 deg; Left 40 deg 10/13/2021 Hamstrings: Right 40 deg; Left 30 deg   LOWER EXTREMITY ROM:   Passive ROM Right eval Left eval Left/Right 11/09/2021  Hip flexion 105 50 75/110  Hip extension       Hip abduction       Hip adduction       Hip internal rotation 15 NT NT/20  Hip external rotation 32 NT NT/32  Knee flexion       Knee extension       Ankle dorsiflexion       Ankle  plantarflexion       Ankle inversion       Ankle eversion        (Blank rows = not tested)   LOWER EXTREMITY MMT:   MMT Right eval Left eval Left 11/09/2021  Hip flexion       Hip extension       Hip abduction 4-/5 2/5 3/5  Hip adduction       Hip internal rotation       Hip external rotation       Knee flexion       Knee extension       Ankle dorsiflexion       Ankle plantarflexion       Ankle inversion       Ankle eversion        (Blank rows = not tested)   GAIT: Distance walked: In the clinic Assistive device utilized: Walker - 4 wheeled Level of assistance: Modified independence Comments: Uses walker to decrease L hip and groin pain       TODAY'S TREATMENT: 11/09/2021: Pelvic rotation 20X 10 seconds Bridging 10X 5 seconds Side lie clams (lie R, lift L) 2 sets of 10 3 seconds with Red theraband  Reverse clams 10X 3 seconds Lumbar extension AROM 10X 3 seconds Shoulder blade pinches 10X 5 seconds NuStep  8 minutes Level 4  Functional Activities (for sit to stand and WB function): Leg Press double leg slow eccentrics 25X 37# Leg Press single leg press 15X 12#   11/04/2021: Pelvic rotation 20X 10 seconds Bridging 10X 5 seconds Side lie clams (lie R, lift L) 2 sets of 10 3 seconds Reverse clams 10X 3 seconds Lumbar extension AROM 10X 3 seconds Shoulder blade pinches 10X 5 seconds NuStep  8 minutes Level 4  Functional Activities (for sit to stand and WB function): Leg Press double leg slow eccentrics 25X 37# Leg Press single leg press 15X 12#   10/28/21:  TherEx:  Scit fit bike L3 X 6 min UE/LE Gastroc stretch on slantboard 3X30 sec Leg press machine DL 37# 2X15, then left leg only 12# 2X10 Standing calf raises/toe raises: x 20 c UE support Standing hip  abd: x 15 each LE c UE support Standing hip extenion X 15 each  c UE support Seated hip add ball sq isometric 5 sec X 15 Seated LAQ 2# X 20 bilat Seated clams green X 15 Standing on Airex:  1/2 tandem  stance: x 20 sec X 2 bilat Standing on airex feet apart eyes closed 20 sec X 3 Figure 4 piriformis stretch x 2 each LE hold 20 sec    PATIENT EDUCATION:  Education details: Reviewed exam findings  Person educated: Patient Education method: Explanation, Demonstration, Verbal cues, and Handouts Education comprehension: verbalized understanding, returned demonstration, verbal cues required, and needs further education     HOME EXERCISE PROGRAM: Access Code: [email or web address] URL: [email or web address] Date: 11/04/2021 Prepared by: Vista Mink  Exercises - Supine Lower Trunk Rotation with PLB  - 2 x daily - 7 x weekly - 1 sets - 10 reps - 10 seconds hold - Clam  - 2 x daily - 7 x weekly - 1-2 sets - 10 reps - 3 seconds hold - Bridge  - 2 x daily - 7 x weekly - 1-2 sets - 10 reps - 3-5 seconds hold - Standing Lumbar Extension at [place]  - 5 x daily - 7 x weekly - 1 sets - 5 reps - 3 seconds hold - Standing Scapular Retraction  - 5 x daily - 7 x weekly - 1 sets - 5 reps - 5 second hold   ASSESSMENT:   CLINICAL IMPRESSION: Calli has made objective progress with her L hip AROM and strength.  Functional scores, gait quality and WB endurance are little changed.  Raquel Sarna and I discussed updates to her HEP and we will review these 1 more time before she follows up with Dr. Erlinda Hong.     OBJECTIVE IMPAIRMENTS Abnormal gait, cardiopulmonary status limiting activity, decreased activity tolerance, decreased balance, decreased endurance, decreased knowledge of condition, decreased mobility, difficulty walking, decreased ROM, decreased strength, impaired perceived functional ability, impaired flexibility, and pain.    ACTIVITY LIMITATIONS bending, standing, squatting, stairs, transfers, bed mobility, dressing, and locomotion level   PARTICIPATION LIMITATIONS: community activity   PERSONAL FACTORS Previous aortic aneurysm, CAD, previous CABG X 1, chronic kidney disease, COPD, cervical DDD,  hyperlipidemia, HTN, hypothyroid, rheumatoid arthritis, previous MI and stroke, shortness of breath, previous R bunionectomy, L carpal tunnel release, R foot bunionectomy, cervical spine surgery, multiple abdominal surgeries, osteoporosis are also affecting patient's functional outcome.    REHAB POTENTIAL: Good to meet the below listed goals.  Will likely need THA for the best functional outcome.   CLINICAL DECISION MAKING: Stable/uncomplicated   EVALUATION COMPLEXITY: Low     GOALS: Goals reviewed with patient? Yes   SHORT TERM GOALS: Target date: 11/10/2021  Improve L hip flexion AROM to at least 70 degrees Baseline: 50 degrees Goal status: Met  11/09/21   2.  Improve L hip abductors strength as assessed by FOTO and WB endurance Baseline: See FOTO and limited standing and walking endurance with a walker Goal status: Met 11/09/2021     LONG TERM GOALS: Target date: 11/24/2021    Improve FOTO to 52 Baseline: 39 Goal status: On Going (41) 11/09/2021   2.  Cheryllynn will be independent with her pre-operative HEP at DC. Baseline: Started 10/13/2021 Goal status: On Going 11/09/2021     PLAN: PT FREQUENCY: 1X   PT DURATION: 6 weeks   PLANNED INTERVENTIONS: Therapeutic exercises, Therapeutic activity, Neuromuscular re-education, Balance training, Gait training, Patient/Family education, Stair training, Cryotherapy,  and Manual therapy   PLAN FOR NEXT SESSION: Review long-term HEP and answer questions.       Farley Ly, PT, MPT 11/09/2021, 1:42 PM

## 2021-11-10 ENCOUNTER — Ambulatory Visit (HOSPITAL_COMMUNITY): Admission: RE | Admit: 2021-11-10 | Payer: Medicare PPO | Source: Ambulatory Visit

## 2021-11-10 ENCOUNTER — Encounter (HOSPITAL_COMMUNITY): Payer: Self-pay

## 2021-11-10 ENCOUNTER — Ambulatory Visit (HOSPITAL_COMMUNITY)
Admission: RE | Admit: 2021-11-10 | Discharge: 2021-11-10 | Disposition: A | Discharge status: Home / Self Care | Payer: Medicare PPO | Source: Ambulatory Visit | Attending: Surgery | Admitting: Surgery

## 2021-11-10 DIAGNOSIS — I728 Aneurysm of other specified arteries: Secondary | ICD-10-CM | POA: Diagnosis not present

## 2021-11-10 DIAGNOSIS — Z9081 Acquired absence of spleen: Secondary | ICD-10-CM | POA: Insufficient documentation

## 2021-11-10 DIAGNOSIS — I774 Celiac artery compression syndrome: Secondary | ICD-10-CM | POA: Diagnosis not present

## 2021-11-10 DIAGNOSIS — I773 Arterial fibromuscular dysplasia: Secondary | ICD-10-CM | POA: Diagnosis not present

## 2021-11-10 DIAGNOSIS — I700 Atherosclerosis of aorta: Secondary | ICD-10-CM | POA: Diagnosis not present

## 2021-11-10 DIAGNOSIS — Z8679 Personal history of other diseases of the circulatory system: Secondary | ICD-10-CM | POA: Insufficient documentation

## 2021-11-10 DIAGNOSIS — I7779 Dissection of other artery: Secondary | ICD-10-CM

## 2021-11-10 DIAGNOSIS — K551 Chronic vascular disorders of intestine: Secondary | ICD-10-CM | POA: Insufficient documentation

## 2021-11-10 MED ORDER — GADOBUTROL 1 MMOL/ML IV SOLN
7.0000 mL | Freq: Once | INTRAVENOUS | Status: AC | PRN
  Administered 2021-11-10: 7 mL via INTRAVENOUS

## 2021-11-11 ENCOUNTER — Encounter: Payer: Self-pay | Admitting: Rehabilitative and Restorative Service Providers"

## 2021-11-11 ENCOUNTER — Ambulatory Visit: Payer: Medicare PPO | Admitting: Rehabilitative and Restorative Service Providers"

## 2021-11-11 DIAGNOSIS — R262 Difficulty in walking, not elsewhere classified: Secondary | ICD-10-CM | POA: Diagnosis not present

## 2021-11-11 DIAGNOSIS — M25552 Pain in left hip: Secondary | ICD-10-CM

## 2021-11-11 DIAGNOSIS — M6281 Muscle weakness (generalized): Secondary | ICD-10-CM

## 2021-11-11 DIAGNOSIS — M25652 Stiffness of left hip, not elsewhere classified: Secondary | ICD-10-CM | POA: Diagnosis not present

## 2021-11-11 NOTE — Therapy (Signed)
OUTPATIENT PHYSICAL THERAPY TREATMENT/DISCHARGE NOTE   Patient Name: [name] MRN: [id number] DOB:1940/12/01, 81 y.o., female Today's Date: 11/11/2021  PCP: Sunday Spillers, MD REFERRING PROVIDER: Leandrew Koyanagi, MD  PHYSICAL THERAPY DISCHARGE SUMMARY  Visits from Start of Care: 7  Current functional level related to goals / functional outcomes: See note   Remaining deficits: See note (WB function)   Education / Equipment: Updated HEP   Patient agrees to discharge. Patient goals were partially met. Patient is being discharged due to maximized rehab potential.    END OF SESSION:   PT End of Session - 11/11/21 0936     Visit Number 7    Number of Visits 10    Date for PT Re-Evaluation 11/24/21    Authorization Type [employer]    Authorization - Number of Visits 10    Progress Note Due on Visit 10    PT Start Time 0930    PT Stop Time 5625    PT Time Calculation (min) 45 min    Activity Tolerance Patient tolerated treatment well;Patient limited by pain    Behavior During Therapy WFL for tasks assessed/performed             Past Medical History:  Diagnosis Date   Abdominal aortic aneurysm ([place])    REPAIRED IN 1996 BY [name]  AND HAS RECENTLY BEEN FOLLOWED BY [name]   Acquired asplenia     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 Most likely scar. Mild subendocardial ischemia. EF 71% LOW RISK.    Cervical disc disease    fracture   Chronic kidney disease    COPD (chronic obstructive pulmonary disease) ([place])    Diverticulosis    Hyperlipidemia    Hypertension    Hypothyroidism (acquired)    hypo   Myocardial  infarction [facility]) Aug. 2016   TIA   Polymyalgia rheumatica ([place])    2011 Dr. Charlestine Night   Rheumatoid arthritis [facility]) 2011   Dr.Truslow; fracture knees, hands and wrists -    S/P CABG x 1 1996   CABG--LIMA-LAD for ostial LAD (not felt to be PCI amenable). EF NORMAL then; LIMA now atretic   Shortness of breath dyspnea    with exertion   Stroke [facility]) 11-2014   TIA    Urinary frequency    Past Surgical History:  Procedure Laterality Date   ABDOMINAL AORTIC ANEURYSM REPAIR  6389   Complicated by mesenteric artery stenosis and splenic artery infarction with acquired Asplenia   APPENDECTOMY     BUNIONECTOMY  07/2011   right foot   CARDIAC CATHETERIZATION  2005   (Most recent CATH) - ostial LAD lesion 20-30% (down from 90% initially). Atretic LIMA. Minimal disease the RCA and Circumflex system.   CARPAL TUNNEL RELEASE Left    CATARACT EXTRACTION Bilateral    CERVICAL SPINE SURGERY     plate 2008 Dr. Saintclair Halsted   CORONARY ARTERY BYPASS GRAFT  1996   INCLUDED AN INTERNAL MAMMARY ARTERY TO THE LAD. EF WAS NORMAL   INGUINAL HERNIA REPAIR Right    LAPAROSCOPIC APPENDECTOMY N/A 06/28/2016  Procedure: APPENDECTOMY LAPAROSCOPIC;  Surgeon: Leighton Ruff, MD;  Location: [facility];  Service: General;  Laterality: N/A;   NM MYOVIEW LTD  March 2012   Fixed anteroseptal, apical and inferoapical defect with moderate size. Most likely scar. Mild subendocardial ischemia. EF 71% LOW RISK.    SPLENECTOMY     TRANSTHORACIC ECHOCARDIOGRAM  12/2014   [facility] Normal LV size & function. EF 55-60%,    vagina polyp     VIDEO ASSISTED THORACOSCOPY (VATS)/WEDGE RESECTION Left 03/02/2015   Procedure: VIDEO ASSISTED THORACOSCOPY (VATS), MINI THORACOTOMY, LEFT UPPER LOBE WEDGE, TAKE DOWN OF INTERNAL MAMMARY LESIONS, PLACEMENT OF ON-Q PUMP;  Surgeon: Grace Isaac, MD;  Location: [place] OR;  Service: Thoracic;  Laterality: Left;   VIDEO BRONCHOSCOPY N/A 03/02/2015   Procedure: BRONCHOSCOPY;  Surgeon: Grace Isaac, MD;  Location: [place];  Service: Thoracic;  Laterality: N/A;   VIDEO BRONCHOSCOPY WITH ENDOBRONCHIAL NAVIGATION N/A 10/08/2017   Procedure: VIDEO BRONCHOSCOPY WITH ENDOBRONCHIAL NAVIGATION WITH BIOPSIES OF LEFT UPPER LOBE AND LEFT LOWER LOBE;  Surgeon: Grace Isaac, MD;  Location: [place];  Service: Thoracic;  Laterality: N/A;   Patient Active Problem List   Diagnosis Date Noted   Primary osteoarthritis of left hip 10/06/2021   Left hip pain 12/09/2020   Aortic atherosclerosis ([place]) 09/07/2020   Gait disorder 06/08/2020   Neoplasm of uncertain behavior of skin 03/14/2020   Contusion of face 03/10/2020   Arthralgia 12/25/2019   Ankle swelling 08/08/2019   Hyperkalemia 05/15/2019   Osteoporosis 02/26/2018   Colon polyp 06/28/2016   COPD GOLD I 05/28/2016   Upper airway cough syndrome 05/22/2016   Acute respiratory failure with hypoxemia ([place]) 04/16/2016   Acute respiratory failure with hypoxia ([place]) 04/15/2016   Acute bronchitis 04/03/2016   Wrist pain, right 12/14/2015   Lung mass 03/02/2015   Pre-operative cardiovascular examination 02/21/2015   DOE (dyspnea on exertion) 02/16/2015   Solitary pulmonary nodule 02/15/2015   TIA (transient ischemic attack) 12/31/2014   Diarrhea 11/26/2014   Well adult exam 11/26/2014   Seasonal allergies 12/31/2013   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   Cerumen impaction 03/20/2013   Dyslipidemia, goal LDL below 70    Pernicious anemia 09/04/2012   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   Long term (current) use of anticoagulants 11/08/2010   Rheumatoid arthritis ([place]) 06/23/2010   Pain in joint 11/26/2009   ROSACEA 03/23/2008   Hypothyroidism 12/08/2006   MACULAR DEGENERATION 12/08/2006   Essential hypertension 12/08/2006   Diaphragmatic hernia 12/08/2006   CRI (chronic renal insufficiency), stage 3 (moderate) ([place]) 12/08/2006   ASPLENIA 12/08/2006   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   Status post repair of Abdominal aortic  aneurysm (during acute ruptured) 12/22/1994    Class: History of   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; now atretic and significantly improved LAD lesion without intervention 12/22/1994    Class: History of    REFERRING DIAG:  M16.12 (ICD-10-CM) - Primary osteoarthritis of left hip   THERAPY DIAG:  Difficulty in walking, not elsewhere classified  Muscle weakness (generalized)  Stiffness of left hip, not elsewhere classified  Pain in left hip  Rationale for Evaluation and Treatment Rehabilitation  PERTINENT HISTORY: Previous aortic aneurysm, CAD, previous CABG X 1, chronic kidney disease, COPD, cervical DDD, hyperlipidemia, HTN, hypothyroid, rheumatoid arthritis, previous MI and stroke, shortness of breath, previous R bunionectomy, L carpal tunnel release, R foot bunionectomy, cervical spine surgery, multiple abdominal surgeries, osteoporosis  PRECAUTIONS: none  SUBJECTIVE: [name] reports that although she is moving better and feels stronger, there has been no change in her WB pain or function due to groin pain.  We discussed reassessment and HEP review today.  PAIN:  Are you having pain? Yes: NPRS scale: 8/10 with weight bearing, 2/10 otherwise on a 10/10 Pain location: left hip Pain description: achy Aggravating factors: walking, standing Relieving factors: sit down, lie down, change position   OBJECTIVE: (objective measures completed at initial evaluation unless otherwise dated)  DIAGNOSTIC FINDINGS: Advanced degenerative joint disease with bone-on-bone joint space  narrowing.    PATIENT SURVEYS:  11/09/2021 FOTO 41 (Goal 52, was 39) 10/13/2021 FOTO 39 (Goal 52 in 13 visits)   COGNITION:           Overall cognitive status: Within functional limits for tasks assessed                          SENSATION: No tingling or numbness   MUSCLE LENGTH: 11/09/2021 Hamstrings: Right 40 deg; Left 40 deg 10/13/2021 Hamstrings: Right 40 deg; Left 30 deg   LOWER EXTREMITY  ROM:   Passive ROM Right eval Left eval Left/Right 11/09/2021  Hip flexion 105 50 75/110  Hip extension       Hip abduction       Hip adduction       Hip internal rotation 15 NT NT/20  Hip external rotation 32 NT NT/32  Knee flexion       Knee extension       Ankle dorsiflexion       Ankle plantarflexion       Ankle inversion       Ankle eversion        (Blank rows = not tested)   LOWER EXTREMITY MMT:   MMT Right eval Left eval Left 11/09/2021  Hip flexion       Hip extension       Hip abduction 4-/5 2/5 3/5  Hip adduction       Hip internal rotation       Hip external rotation       Knee flexion       Knee extension       Ankle dorsiflexion       Ankle plantarflexion       Ankle inversion       Ankle eversion        (Blank rows = not tested)   GAIT: Distance walked: In the clinic Assistive device utilized: Walker - 4 wheeled Level of assistance: Modified independence Comments: Uses walker to decrease L hip and groin pain       TODAY'S TREATMENT: 7/14/202023: Pelvic rotation 10X 10 seconds Bridging 10X 5 seconds Side lie clams (lie R, lift L) 2 sets of 10 3 seconds with Red theraband  Reverse clams 10X 3 seconds Lumbar extension AROM 10X 3 seconds Shoulder blade pinches 10X 5 seconds NuStep  8 minutes Level 4  Functional Activities (for sit to stand and WB function): Leg Press double leg slow eccentrics 25X 37# Leg Press single leg press 15X 12#   11/09/2021: Pelvic rotation 20X 10 seconds Bridging 10X 5 seconds Side lie clams (lie R, lift L) 2 sets of 10 3 seconds with Red theraband  Reverse clams 10X 3 seconds Lumbar extension AROM 10X 3 seconds Shoulder blade pinches 10X 5 seconds NuStep  8 minutes Level 4  Functional Activities (for sit to stand and WB function): Leg Press  double leg slow eccentrics 25X 37#   Neuromuscular re-education: Tandem balance eyes open; head turning; eyes closed 2X 20 seconds each   11/04/2021: Pelvic rotation 20X  10 seconds Bridging 10X 5 seconds Side lie clams (lie R, lift L) 2 sets of 10 3 seconds Reverse clams 10X 3 seconds Lumbar extension AROM 10X 3 seconds Shoulder blade pinches 10X 5 seconds NuStep  8 minutes Level 4  Functional Activities (for sit to stand and WB function): Leg Press double leg slow eccentrics 25X 37# Leg Press single leg press 15X 12#     PATIENT EDUCATION:  Education details: Reviewed exam findings  Person educated: Patient Education method: Consulting civil engineer, Demonstration, Verbal cues, and Handouts Education comprehension: verbalized understanding, returned demonstration, verbal cues required, and needs further education     HOME EXERCISE PROGRAM: Access Code: [email or web address] URL: [email or web address] Date: 11/11/2021 Prepared by: Vista Mink  Exercises - Supine Lower Trunk Rotation  - 1 x daily - 7 x weekly - 1 sets - 10 reps - 10 seconds hold - Clam  - 1-2 x daily - 7 x weekly - 2 sets - 10 reps - 3 seconds hold - Bridge  - 1-2 x daily - 7 x weekly - 1-2 sets - 10 reps - 3-5 seconds hold - Standing Lumbar Extension at [place]  - 5 x daily - 7 x weekly - 1 sets - 5 reps - 3 seconds hold - Standing Scapular Retraction  - 5 x daily - 7 x weekly - 1 sets - 5 reps - 5 second hold - Beginner Reverse Clamshell  - 1-2 x daily - 7 x weekly - 1 sets - 10 reps - 3 second hold - Tandem Stance  - 1 x daily - 7 x weekly - 1 sets - 4-5 reps - 20 second hold    ASSESSMENT:   CLINICAL IMPRESSION: Amaziah has made objective progress with her L hip AROM and strength.  Functional scores, gait quality and WB endurance are little changed.  [name] and I discussed updates to her HEP.  She will follow up with Dr. Erlinda Hong and request THA.   OBJECTIVE IMPAIRMENTS Abnormal gait, cardiopulmonary status limiting activity, decreased activity tolerance, decreased balance, decreased endurance, decreased knowledge of condition, decreased mobility, difficulty walking, decreased ROM,  decreased strength, impaired perceived functional ability, impaired flexibility, and pain.    ACTIVITY LIMITATIONS bending, standing, squatting, stairs, transfers, bed mobility, dressing, and locomotion level   PARTICIPATION LIMITATIONS: community activity   PERSONAL FACTORS Previous aortic aneurysm, CAD, previous CABG X 1, chronic kidney disease, COPD, cervical DDD, hyperlipidemia, HTN, hypothyroid, rheumatoid arthritis, previous MI and stroke, shortness of breath, previous R bunionectomy, L carpal tunnel release, R foot bunionectomy, cervical spine surgery, multiple abdominal surgeries, osteoporosis are also affecting patient's functional outcome.    REHAB POTENTIAL: Good to meet the below listed goals.  Will likely need THA for the best functional outcome.   CLINICAL DECISION MAKING: Stable/uncomplicated   EVALUATION COMPLEXITY: Low     GOALS: Goals reviewed with patient? Yes   SHORT TERM GOALS: Target date: 11/10/2021  Improve L hip flexion AROM to at least 70 degrees Baseline: 50 degrees Goal status: Met  11/09/21   2.  Improve L hip abductors strength as assessed by FOTO and WB endurance Baseline: See FOTO and limited standing and walking endurance with a walker Goal status: Met 11/09/2021     LONG TERM GOALS: Target date: 11/24/2021    Improve FOTO to 52 Baseline: 39 Goal  status: On Going (41) 11/09/2021   2.  [name] will be independent with her pre-operative HEP at DC. Baseline: Started 10/13/2021 Goal status: On Going 11/11/2021     PLAN: PT FREQUENCY: DC   PT DURATION: DC   PLANNED INTERVENTIONS: Therapeutic exercises, Therapeutic activity, Neuromuscular re-education, Balance training, Gait training, Patient/Family education, Stair training, Cryotherapy, and Manual therapy   PLAN FOR NEXT SESSION: DC       Farley Ly, PT, MPT 11/11/2021, 4:34 PM

## 2021-11-12 ENCOUNTER — Encounter (HOSPITAL_COMMUNITY): Payer: Self-pay

## 2021-11-12 ENCOUNTER — Ambulatory Visit (HOSPITAL_COMMUNITY)
Admission: RE | Admit: 2021-11-12 | Discharge: 2021-11-12 | Disposition: A | Discharge status: Home / Self Care | Payer: Medicare PPO | Source: Ambulatory Visit | Attending: Surgery | Admitting: Surgery

## 2021-11-12 ENCOUNTER — Other Ambulatory Visit (HOSPITAL_COMMUNITY): Payer: Medicare PPO

## 2021-11-12 NOTE — Progress Notes (Signed)
Mr abdomen pelvis wwo are duplicate orders per Dr. Trula Slade. Pt called and notified not to come in today

## 2021-11-14 ENCOUNTER — Ambulatory Visit: Payer: Medicare PPO | Admitting: Surgery

## 2021-11-14 ENCOUNTER — Encounter: Payer: Self-pay | Admitting: Surgery

## 2021-11-14 VITALS — BP 148/82 | HR 58 | Temp 97.7°F | Resp 20 | Ht 63.0 in | Wt 152.8 lb

## 2021-11-14 DIAGNOSIS — I7779 Dissection of other artery: Secondary | ICD-10-CM

## 2021-11-14 DIAGNOSIS — I728 Aneurysm of other specified arteries: Secondary | ICD-10-CM | POA: Diagnosis not present

## 2021-11-14 NOTE — Progress Notes (Signed)
[facility] [place]  Patient name: [name] MRN: [id number] DOB: October 26, 1940 Sex: female   REASON FOR VISIT:    Follow-up  HISOTRY OF PRESENT ILLNESS:   The patient is back today for follow up. I inherited her from Dr. Amedeo Plenty in 2011. She has a history of multiple splenic aneurysms and required an emergent splenectomy for a ruptured splenic artery aneurysm by Dr. Leafy Kindle in 1996. 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 She has been treated in the past with steroids for polyarteritis or fibromuscular disease. Currently she is not on steroids.  She takes a statin for hypercholesterolemia.  She is medically managed for hypertension.  She is scheduled to get her left hip replaced.   She denies any abdominal pain or back pain.    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 Most likely scar. Mild subendocardial ischemia. EF 71% LOW RISK.    Cervical disc disease    fracture   Chronic kidney disease    COPD (chronic obstructive pulmonary disease) ([place])    Diverticulosis    Hyperlipidemia    Hypertension    Hypothyroidism (acquired)    hypo   Myocardial infarction [facility]) Aug. 2016   TIA   Polymyalgia rheumatica ([place])     2011 Dr. Charlestine Night   Rheumatoid arthritis [facility]) 2011   Dr.Truslow; fracture knees, hands and wrists -    S/P CABG x 1 1996   CABG--LIMA-LAD for ostial LAD (not felt to be PCI amenable). EF NORMAL then; LIMA now atretic   Shortness of breath dyspnea    with exertion   Stroke ([place]) 11-2014   TIA    Urinary frequency      FAMILY HISTORY:   Family History  Problem Relation Age of Onset   Hypertension Mother    Diabetes Mother    Heart disease Mother    Hyperlipidemia Mother    Stroke Father    Hyperlipidemia Sister    Hypertension Sister    Hypertension Daughter    Cancer Paternal Uncle        Deceased from cancer not sure of site   Hypertension Son    Hyperlipidemia Son    Hyperlipidemia Son    Hypertension Son    Hyperlipidemia Son    Hypertension Son    Hypertension Other    Coronary artery disease Other    Asthma Neg Hx    Colon cancer Neg Hx     SOCIAL HISTORY:   Social History   Tobacco Use   Smoking status: Former    Types: Cigarettes    Quit date: 12/04/1958    Years since quitting: 62.9    Passive exposure: Never  Smokeless tobacco: Never  Substance Use Topics   Alcohol use: Yes    Comment: occassionally     ALLERGIES:   Allergies  Allergen Reactions   Asa [Aspirin]    Aspirin Other (See Comments)    Hurts stomach   Codeine Nausea And Vomiting   Nitroglycerin Other (See Comments)    Heart rate drops   Nitroglycerin      CURRENT MEDICATIONS:   Current Outpatient Medications  Medication Sig Dispense Refill   acetaminophen (TYLENOL) 500 MG tablet Take 500 mg by mouth every 6 (six) hours as needed (pain).      albuterol (VENTOLIN HFA) 108 (90 Base) MCG/ACT inhaler Inhale 2 puffs into the lungs every 4 (four) hours as needed for wheezing or shortness of breath. 8 g 2   amLODipine (NORVASC) 2.5 MG tablet Take 1 tablet (2.5 mg total) by mouth daily. 90 tablet 2   atorvastatin (LIPITOR) 80 MG tablet TAKE 1 TABLET EVERY DAY 90 tablet 2    Chlorpheniramine Maleate (CHLORPHEN PO) Take by mouth.     Cholecalciferol (VITAMIN D3) 50 MCG (2000 UT) capsule Take 1 capsule (2,000 Units total) by mouth daily. 100 capsule 3   clopidogrel (PLAVIX) 75 MG tablet TAKE 1 TABLET EVERY DAY 90 tablet 1   Cyanocobalamin (B-12) 2500 MCG TABS one po qd 100 tablet 3   famotidine (PEPCID) 20 MG tablet TAKE 1 TABLET AT BEDTIME 90 tablet 3   gabapentin (NEURONTIN) 300 MG capsule TAKE 1 CAPSULE BY MOUTH THREE TIMES A DAY 270 capsule 1   Golimumab (SIMPONI [place]) Every other month per Dr Trudie Reed     hydrochlorothiazide (HYDRODIURIL) 12.5 MG tablet TAKE 1 TABLET BY MOUTH EVERY DAY 90 tablet 3   leflunomide (ARAVA) 20 MG tablet Take 20 mg by mouth daily.      levothyroxine (SYNTHROID) 88 MCG tablet Take 1 tablet (88 mcg total) by mouth daily before breakfast. 90 tablet 3   loperamide (IMODIUM A-D) 2 MG tablet Take 2 mg by mouth 4 (four) times daily as needed for diarrhea or loose stools.     losartan (COZAAR) 50 MG tablet TAKE 1 TABLET EVERY DAY (STOP LOSARTAN 100 MG) 90 tablet 1   methylPREDNISolone (MEDROL DOSEPAK) 4 MG TBPK tablet As directed 21 tablet 1   metoprolol tartrate (LOPRESSOR) 50 MG tablet TAKE 1 AND 1/2 TABLETS TWICE DAILY 270 tablet 3   pantoprazole (PROTONIX) 40 MG tablet TAKE 1 TABLET DAILY. TAKE 30 TO 60 MINUTES BEFORE FIRST MEAL OF THE DAY 90 tablet 3   psyllium (METAMUCIL) 58.6 % packet Take 1 packet by mouth daily.     Tiotropium Bromide-Olodaterol (STIOLTO RESPIMAT) 2.5-2.5 MCG/ACT AERS INHALE 2 PUFFS BY MOUTH INTO THE LUNGS DAILY 4 g 11   triamcinolone (NASACORT) 55 MCG/ACT AERO nasal inhaler Place 1 spray into the nose daily.      No current facility-administered medications for this visit.    REVIEW OF SYSTEMS:   '[X]'$  denotes positive finding, '[ ]'$  denotes negative finding Cardiac  Comments:  Chest pain or chest pressure:    Shortness of breath upon exertion:    Short of breath when lying flat:    Irregular heart rhythm:         Vascular    Pain in calf, thigh, or hip brought on by ambulation:    Pain in feet at night that wakes you up from your sleep:     Blood clot in your veins:    Leg swelling:  Pulmonary    Oxygen at home:    Productive cough:     Wheezing:         Neurologic    Sudden weakness in arms or legs:     Sudden numbness in arms or legs:     Sudden onset of difficulty speaking or slurred speech:    Temporary loss of vision in one eye:     Problems with dizziness:         Gastrointestinal    Blood in stool:     Vomited blood:         Genitourinary    Burning when urinating:     Blood in urine:        Psychiatric    Major depression:         Hematologic    Bleeding problems:    Problems with blood clotting too easily:        Skin    Rashes or ulcers:        Constitutional    Fever or chills:      PHYSICAL EXAM:   Vitals:   11/14/21 1010  BP: (!) 148/82  Pulse: (!) 58  Resp: 20  Temp: 97.7 F (36.5 C)  SpO2: 93%  Weight: 152 lb 12.8 oz (69.3 kg)  Height: '5\' 3"'$  (1.6 m)    GENERAL: The patient is a well-nourished female, in no acute distress. The vital signs are documented above. CARDIAC: There is a regular rate and rhythm.  PULMONARY: Non-labored respirations ABDOMEN: Soft and non-tender  MUSCULOSKELETAL: There are no major deformities or cyanosis. NEUROLOGIC: No focal weakness or paresthesias are detected. SKIN: There are no ulcers or rashes noted. PSYCHIATRIC: The patient has a normal affect.  STUDIES:   I have reviewed her MRI with the following findings:  1. Overall, no interval change in the MRA appearance of the abdomen and pelvis. 2. 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 3. 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 4. Surgical changes of prior splenectomy. No evidence of recurrent splenic artery aneurysm. 5. 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 6.  Aortic Atherosclerosis (ICD10-I70.0). 7. Additional ancillary findings as above without significant interval change MEDICAL ISSUES:   The patient has a history of multiple mesenteric artery aneurysms particularly in her spleen which was associated with rupture.  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  She has had no interval change.  Her MRI remained stable.  We will repeat this in 2 years.    Leia Alf, MD, FACS [facility] [phone] Pager [phone]

## 2021-11-16 ENCOUNTER — Inpatient Hospital Stay (HOSPITAL_COMMUNITY)
Admission: EM | Admit: 2021-11-16 | Discharge: 2021-11-20 | DRG: 378 | Disposition: A | Discharge status: Home / Self Care | Payer: Medicare PPO | Attending: Internal Medicine | Admitting: Internal Medicine

## 2021-11-16 ENCOUNTER — Encounter (HOSPITAL_COMMUNITY): Payer: Self-pay

## 2021-11-16 ENCOUNTER — Other Ambulatory Visit: Payer: Self-pay

## 2021-11-16 DIAGNOSIS — Z7951 Long term (current) use of inhaled steroids: Secondary | ICD-10-CM

## 2021-11-16 DIAGNOSIS — I10 Essential (primary) hypertension: Secondary | ICD-10-CM | POA: Diagnosis not present

## 2021-11-16 DIAGNOSIS — N184 Chronic kidney disease, stage 4 (severe): Secondary | ICD-10-CM | POA: Diagnosis present

## 2021-11-16 DIAGNOSIS — K922 Gastrointestinal hemorrhage, unspecified: Secondary | ICD-10-CM | POA: Diagnosis present

## 2021-11-16 DIAGNOSIS — Z885 Allergy status to narcotic agent status: Secondary | ICD-10-CM

## 2021-11-16 DIAGNOSIS — Z86010 Personal history of colonic polyps: Secondary | ICD-10-CM

## 2021-11-16 DIAGNOSIS — Z9081 Acquired absence of spleen: Secondary | ICD-10-CM

## 2021-11-16 DIAGNOSIS — Z87891 Personal history of nicotine dependence: Secondary | ICD-10-CM

## 2021-11-16 DIAGNOSIS — M069 Rheumatoid arthritis, unspecified: Secondary | ICD-10-CM | POA: Diagnosis present

## 2021-11-16 DIAGNOSIS — Z951 Presence of aortocoronary bypass graft: Secondary | ICD-10-CM

## 2021-11-16 DIAGNOSIS — E785 Hyperlipidemia, unspecified: Secondary | ICD-10-CM | POA: Diagnosis present

## 2021-11-16 DIAGNOSIS — Z7902 Long term (current) use of antithrombotics/antiplatelets: Secondary | ICD-10-CM | POA: Diagnosis not present

## 2021-11-16 DIAGNOSIS — Z79890 Hormone replacement therapy (postmenopausal): Secondary | ICD-10-CM

## 2021-11-16 DIAGNOSIS — K921 Melena: Secondary | ICD-10-CM | POA: Diagnosis present

## 2021-11-16 DIAGNOSIS — D62 Acute posthemorrhagic anemia: Secondary | ICD-10-CM | POA: Diagnosis present

## 2021-11-16 DIAGNOSIS — Z79899 Other long term (current) drug therapy: Secondary | ICD-10-CM

## 2021-11-16 DIAGNOSIS — Z8249 Family history of ischemic heart disease and other diseases of the circulatory system: Secondary | ICD-10-CM | POA: Diagnosis not present

## 2021-11-16 DIAGNOSIS — D631 Anemia in chronic kidney disease: Secondary | ICD-10-CM | POA: Diagnosis present

## 2021-11-16 DIAGNOSIS — I160 Hypertensive urgency: Secondary | ICD-10-CM | POA: Diagnosis not present

## 2021-11-16 DIAGNOSIS — I129 Hypertensive chronic kidney disease with stage 1 through stage 4 chronic kidney disease, or unspecified chronic kidney disease: Secondary | ICD-10-CM | POA: Diagnosis present

## 2021-11-16 DIAGNOSIS — K644 Residual hemorrhoidal skin tags: Secondary | ICD-10-CM | POA: Diagnosis present

## 2021-11-16 DIAGNOSIS — Z8679 Personal history of other diseases of the circulatory system: Secondary | ICD-10-CM

## 2021-11-16 DIAGNOSIS — J449 Chronic obstructive pulmonary disease, unspecified: Secondary | ICD-10-CM | POA: Diagnosis present

## 2021-11-16 DIAGNOSIS — M353 Polymyalgia rheumatica: Secondary | ICD-10-CM | POA: Diagnosis present

## 2021-11-16 DIAGNOSIS — Z886 Allergy status to analgesic agent status: Secondary | ICD-10-CM

## 2021-11-16 DIAGNOSIS — I252 Old myocardial infarction: Secondary | ICD-10-CM | POA: Diagnosis not present

## 2021-11-16 DIAGNOSIS — E039 Hypothyroidism, unspecified: Secondary | ICD-10-CM | POA: Diagnosis present

## 2021-11-16 DIAGNOSIS — Z888 Allergy status to other drugs, medicaments and biological substances status: Secondary | ICD-10-CM

## 2021-11-16 DIAGNOSIS — Z8673 Personal history of transient ischemic attack (TIA), and cerebral infarction without residual deficits: Secondary | ICD-10-CM

## 2021-11-16 DIAGNOSIS — K5731 Diverticulosis of large intestine without perforation or abscess with bleeding: Secondary | ICD-10-CM | POA: Diagnosis present

## 2021-11-16 DIAGNOSIS — I2510 Atherosclerotic heart disease of native coronary artery without angina pectoris: Secondary | ICD-10-CM | POA: Diagnosis present

## 2021-11-16 DIAGNOSIS — K625 Hemorrhage of anus and rectum: Secondary | ICD-10-CM | POA: Diagnosis present

## 2021-11-16 DIAGNOSIS — R58 Hemorrhage, not elsewhere classified: Secondary | ICD-10-CM | POA: Diagnosis not present

## 2021-11-16 DIAGNOSIS — Z8719 Personal history of other diseases of the digestive system: Secondary | ICD-10-CM | POA: Diagnosis not present

## 2021-11-16 NOTE — ED Triage Notes (Signed)
PER EMS: pt is from home with c/o rectal bleeding, hx of hemorrhoids. She states she has been constipated but had one BM today and noticed blood dripping from her bottom. She takes Plavix.  BP-202/88, HR-60, O2-94%

## 2021-11-16 NOTE — ED Provider Triage Note (Signed)
Emergency Medicine Provider Triage Evaluation Note  [name] , a 81 y.o. female  was evaluated in triage.  Pt complains of rectal bleeding. Has hx of hemorrhoid.  Report mild constipation today and notice BRBPR when having BM.  Felt it may be from her hemorrhoid but report being on Plavix.  Denies abd pain or rectal pain.    Review of Systems  Positive: As above Negative: As above  Physical Exam  BP (!) 231/67 (BP Location: Left Arm)   Pulse (!) 57   Temp 97.6 F (36.4 C) (Oral)   Resp 18   Ht '5\' 3"'$  (1.6 m)   Wt 68 kg   SpO2 99%   BMI 26.57 kg/m  Gen:   Awake, no distress   Resp:  Normal effort  MSK:   Moves extremities without difficulty  Other:    Medical Decision Making  Medically screening exam initiated at 11:11 PM.  Appropriate orders placed.  [name] was informed that the remainder of the evaluation will be completed by another provider, this initial triage assessment does not replace that evaluation, and the importance of remaining in the ED until their evaluation is complete.  BP is markedly elevated.  Will need recheck   Domenic Moras, PA-C 11/16/21 2318

## 2021-11-17 ENCOUNTER — Ambulatory Visit: Payer: Medicare PPO | Admitting: Orthopaedic Surgery

## 2021-11-17 ENCOUNTER — Encounter (HOSPITAL_COMMUNITY): Payer: Self-pay | Admitting: Emergency Medicine

## 2021-11-17 DIAGNOSIS — M069 Rheumatoid arthritis, unspecified: Secondary | ICD-10-CM

## 2021-11-17 DIAGNOSIS — I129 Hypertensive chronic kidney disease with stage 1 through stage 4 chronic kidney disease, or unspecified chronic kidney disease: Secondary | ICD-10-CM

## 2021-11-17 DIAGNOSIS — K625 Hemorrhage of anus and rectum: Secondary | ICD-10-CM | POA: Diagnosis not present

## 2021-11-17 DIAGNOSIS — I160 Hypertensive urgency: Secondary | ICD-10-CM

## 2021-11-17 DIAGNOSIS — D62 Acute posthemorrhagic anemia: Secondary | ICD-10-CM | POA: Diagnosis not present

## 2021-11-17 DIAGNOSIS — Z86010 Personal history of colonic polyps: Secondary | ICD-10-CM | POA: Diagnosis not present

## 2021-11-17 DIAGNOSIS — K921 Melena: Secondary | ICD-10-CM | POA: Diagnosis not present

## 2021-11-17 DIAGNOSIS — K922 Gastrointestinal hemorrhage, unspecified: Secondary | ICD-10-CM | POA: Diagnosis present

## 2021-11-17 DIAGNOSIS — N184 Chronic kidney disease, stage 4 (severe): Secondary | ICD-10-CM | POA: Diagnosis present

## 2021-11-17 DIAGNOSIS — I10 Essential (primary) hypertension: Secondary | ICD-10-CM | POA: Diagnosis not present

## 2021-11-17 HISTORY — DX: Hemorrhage of anus and rectum: K62.5

## 2021-11-17 LAB — COMPREHENSIVE METABOLIC PANEL
ALT: 14 U/L (ref 0–44)
AST: 17 U/L (ref 15–41)
Albumin: 3.6 g/dL (ref 3.5–5.0)
Alkaline Phosphatase: 68 U/L (ref 38–126)
Anion gap: 10 (ref 5–15)
BUN: 69 mg/dL — ABNORMAL HIGH (ref 8–23)
CO2: 25 mmol/L (ref 22–32)
Calcium: 9.8 mg/dL (ref 8.9–10.3)
Chloride: 103 mmol/L (ref 98–111)
Creatinine, Ser: 2.48 mg/dL — ABNORMAL HIGH (ref 0.44–1.00)
GFR, Estimated: 19 mL/min — ABNORMAL LOW (ref >60)
Glucose, Bld: 101 mg/dL — ABNORMAL HIGH (ref 70–99)
Potassium: 4.2 mmol/L (ref 3.5–5.1)
Sodium: 138 mmol/L (ref 135–145)
Total Bilirubin: 0.7 mg/dL (ref 0.3–1.2)
Total Protein: 6.7 g/dL (ref 6.5–8.1)

## 2021-11-17 LAB — BASIC METABOLIC PANEL
Anion gap: 9 (ref 5–15)
BUN: 58 mg/dL — ABNORMAL HIGH (ref 8–23)
CO2: 21 mmol/L — ABNORMAL LOW (ref 22–32)
Calcium: 8.3 mg/dL — ABNORMAL LOW (ref 8.9–10.3)
Chloride: 111 mmol/L (ref 98–111)
Creatinine, Ser: 2.09 mg/dL — ABNORMAL HIGH (ref 0.44–1.00)
GFR, Estimated: 23 mL/min — ABNORMAL LOW (ref >60)
Glucose, Bld: 99 mg/dL (ref 70–99)
Potassium: 3.4 mmol/L — ABNORMAL LOW (ref 3.5–5.1)
Sodium: 141 mmol/L (ref 135–145)

## 2021-11-17 LAB — CBC
HCT: 34.3 % — ABNORMAL LOW (ref 36.0–46.0)
HCT: 35.5 % — ABNORMAL LOW (ref 36.0–46.0)
Hemoglobin: 11.3 g/dL — ABNORMAL LOW (ref 12.0–15.0)
Hemoglobin: 11.9 g/dL — ABNORMAL LOW (ref 12.0–15.0)
MCH: 32.2 pg (ref 26.0–34.0)
MCH: 32.4 pg (ref 26.0–34.0)
MCHC: 32.9 g/dL (ref 30.0–36.0)
MCHC: 33.5 g/dL (ref 30.0–36.0)
MCV: 96.7 fL (ref 80.0–100.0)
MCV: 97.7 fL (ref 80.0–100.0)
Platelets: 398 10*3/uL (ref 150–400)
Platelets: 435 10*3/uL — ABNORMAL HIGH (ref 150–400)
RBC: 3.51 MIL/uL — ABNORMAL LOW (ref 3.87–5.11)
RBC: 3.67 MIL/uL — ABNORMAL LOW (ref 3.87–5.11)
RDW: 15.5 % (ref 11.5–15.5)
RDW: 15.6 % — ABNORMAL HIGH (ref 11.5–15.5)
WBC: 8.9 10*3/uL (ref 4.0–10.5)
WBC: 9.2 10*3/uL (ref 4.0–10.5)
nRBC: 0.7 % — ABNORMAL HIGH (ref 0.0–0.2)
nRBC: 0.7 % — ABNORMAL HIGH (ref 0.0–0.2)

## 2021-11-17 LAB — TYPE AND SCREEN
ABO/RH(D): O POS
Antibody Screen: NEGATIVE

## 2021-11-17 LAB — HEMOGLOBIN
Hemoglobin: 11.5 g/dL — ABNORMAL LOW (ref 12.0–15.0)
Hemoglobin: 10.9 g/dL — ABNORMAL LOW (ref 12.0–15.0)

## 2021-11-17 MED ORDER — ONDANSETRON HCL 4 MG/2ML IJ SOLN: 4.0000 mg | Freq: Four times a day (QID) | INTRAMUSCULAR | Status: DC | PRN

## 2021-11-17 MED ORDER — PANTOPRAZOLE SODIUM 40 MG PO TBEC
40.0000 mg | DELAYED_RELEASE_TABLET | Freq: Every day | ORAL | Status: DC
  Administered 2021-11-17 – 2021-11-20 (×4): 40 mg via ORAL
  Filled 2021-11-17 (×4): qty 1

## 2021-11-17 MED ORDER — ONDANSETRON HCL 4 MG PO TABS: 4.0000 mg | ORAL_TABLET | Freq: Four times a day (QID) | ORAL | Status: DC | PRN

## 2021-11-17 MED ORDER — AMLODIPINE BESYLATE 5 MG PO TABS
2.5000 mg | ORAL_TABLET | Freq: Every day | ORAL | Status: DC
  Administered 2021-11-17 – 2021-11-20 (×4): 2.5 mg via ORAL
  Filled 2021-11-17 (×4): qty 1

## 2021-11-17 MED ORDER — ACETAMINOPHEN 650 MG RE SUPP: 650.0000 mg | Freq: Four times a day (QID) | RECTAL | Status: DC | PRN

## 2021-11-17 MED ORDER — ALBUTEROL SULFATE HFA 108 (90 BASE) MCG/ACT IN AERS
2.0000 | INHALATION_SPRAY | RESPIRATORY_TRACT | Status: DC | PRN
Start: 2021-11-17 — End: 2021-11-17

## 2021-11-17 MED ORDER — LEFLUNOMIDE 20 MG PO TABS
20.0000 mg | ORAL_TABLET | Freq: Every day | ORAL | Status: DC
  Administered 2021-11-17 – 2021-11-20 (×4): 20 mg via ORAL
  Filled 2021-11-17 (×4): qty 1

## 2021-11-17 MED ORDER — LOSARTAN POTASSIUM 25 MG PO TABS
50.0000 mg | ORAL_TABLET | Freq: Every day | ORAL | Status: DC
Start: 2021-11-17 — End: 2021-11-17

## 2021-11-17 MED ORDER — POTASSIUM CHLORIDE CRYS ER 20 MEQ PO TBCR
40.0000 meq | EXTENDED_RELEASE_TABLET | Freq: Once | ORAL | Status: AC
Start: 2021-11-17 — End: 2021-11-17
  Administered 2021-11-17: 40 meq via ORAL
  Filled 2021-11-17: qty 2

## 2021-11-17 MED ORDER — HYDROCHLOROTHIAZIDE 12.5 MG PO TABS: 12.5000 mg | ORAL_TABLET | Freq: Every day | ORAL | Status: DC

## 2021-11-17 MED ORDER — DOCUSATE SODIUM 100 MG PO CAPS
100.0000 mg | ORAL_CAPSULE | Freq: Every day | ORAL | Status: DC
  Administered 2021-11-17 – 2021-11-20 (×4): 100 mg via ORAL
  Filled 2021-11-17 (×4): qty 1

## 2021-11-17 MED ORDER — FAMOTIDINE 20 MG PO TABS
20.0000 mg | ORAL_TABLET | Freq: Every day | ORAL | Status: DC
  Administered 2021-11-17 – 2021-11-19 (×3): 20 mg via ORAL
  Filled 2021-11-17 (×3): qty 1

## 2021-11-17 MED ORDER — LEVOTHYROXINE SODIUM 88 MCG PO TABS
88.0000 ug | ORAL_TABLET | Freq: Every day | ORAL | Status: DC
Start: 2021-11-17 — End: 2021-11-20
  Administered 2021-11-17 – 2021-11-20 (×4): 88 ug via ORAL
  Filled 2021-11-17 (×4): qty 1

## 2021-11-17 MED ORDER — ATORVASTATIN CALCIUM 40 MG PO TABS
80.0000 mg | ORAL_TABLET | Freq: Every day | ORAL | Status: DC
  Administered 2021-11-17 – 2021-11-20 (×4): 80 mg via ORAL
  Filled 2021-11-17 (×4): qty 2

## 2021-11-17 MED ORDER — UMECLIDINIUM BROMIDE 62.5 MCG/ACT IN AEPB
1.0000 | INHALATION_SPRAY | Freq: Every day | RESPIRATORY_TRACT | Status: DC
  Administered 2021-11-19 – 2021-11-20 (×2): 1 via RESPIRATORY_TRACT
  Filled 2021-11-17: qty 7

## 2021-11-17 MED ORDER — LABETALOL HCL 5 MG/ML IV SOLN
10.0000 mg | INTRAVENOUS | Status: DC | PRN
  Administered 2021-11-17: 10 mg via INTRAVENOUS
  Administered 2021-11-18 – 2021-11-19 (×4): 20 mg via INTRAVENOUS
  Filled 2021-11-17 (×5): qty 4

## 2021-11-17 MED ORDER — METOPROLOL TARTRATE 50 MG PO TABS
75.0000 mg | ORAL_TABLET | Freq: Two times a day (BID) | ORAL | Status: DC
  Administered 2021-11-17 – 2021-11-20 (×7): 75 mg via ORAL
  Filled 2021-11-17: qty 1
  Filled 2021-11-17: qty 3
  Filled 2021-11-17 (×5): qty 1

## 2021-11-17 MED ORDER — ACETAMINOPHEN 325 MG PO TABS: 650.0000 mg | ORAL_TABLET | Freq: Four times a day (QID) | ORAL | Status: DC | PRN

## 2021-11-17 MED ORDER — SODIUM CHLORIDE 0.9 % IV SOLN: INTRAVENOUS | Status: DC

## 2021-11-17 MED ORDER — LABETALOL HCL 5 MG/ML IV SOLN
10.0000 mg | Freq: Once | INTRAVENOUS | Status: AC
  Administered 2021-11-17: 10 mg via INTRAVENOUS
  Filled 2021-11-17: qty 4

## 2021-11-17 MED ORDER — CLONIDINE HCL 0.1 MG PO TABS
0.1000 mg | ORAL_TABLET | Freq: Once | ORAL | Status: AC
Start: 2021-11-17 — End: 2021-11-17
  Administered 2021-11-17: 0.1 mg via ORAL
  Filled 2021-11-17: qty 1

## 2021-11-17 MED ORDER — ALBUTEROL SULFATE (2.5 MG/3ML) 0.083% IN NEBU: 2.5000 mg | INHALATION_SOLUTION | RESPIRATORY_TRACT | Status: DC | PRN

## 2021-11-17 MED ORDER — ARFORMOTEROL TARTRATE 15 MCG/2ML IN NEBU
15.0000 ug | INHALATION_SOLUTION | Freq: Two times a day (BID) | RESPIRATORY_TRACT | Status: DC
  Administered 2021-11-18 – 2021-11-20 (×4): 15 ug via RESPIRATORY_TRACT
  Filled 2021-11-17 (×5): qty 2

## 2021-11-17 MED ORDER — GABAPENTIN 300 MG PO CAPS
300.0000 mg | ORAL_CAPSULE | Freq: Two times a day (BID) | ORAL | Status: DC
  Administered 2021-11-17 – 2021-11-20 (×7): 300 mg via ORAL
  Filled 2021-11-17 (×7): qty 1

## 2021-11-17 MED ORDER — ACETAMINOPHEN 500 MG PO TABS: 500.0000 mg | ORAL_TABLET | Freq: Every day | ORAL | Status: DC | PRN

## 2021-11-17 NOTE — Assessment & Plan Note (Signed)
HPI highly suggestive for hemorrhoidal bleeding, perhaps internal hemorrhoid from hard BM earlier in day. Complete lack of any abdominal pain argues against any sort of major vascular ischemic injury. 1. Putting pt in observation to monitor HGB 2. Clear liquid diet. 3. Hold plavix 4. If bleeding persists, call GI in AM.

## 2021-11-17 NOTE — ED Notes (Addendum)
Provided Delo MD copy of pt's lab results from lab from downtime.

## 2021-11-17 NOTE — ED Notes (Signed)
Pt ambulatory to restroom. Pt called out for assistance and pt is actively bleeding out of her bottom. Pt given pad.

## 2021-11-17 NOTE — Assessment & Plan Note (Signed)
Looks like pt with progressively worsening renal function over past 3 years: Creat today of 2.5 is up from 2.0 in Feb, 2.0 last year, 1.7 in 2021, and 1.5 in 2020. ? HTN nephropathy worsening, or perhaps undiagnosed 'partially treated' vasculitis given h/o multiple abdominal artery aneurysms, pt on DMARD + Biologic for RA. 1. Holding HCTZ and ARB as nephrotoxic 2. Repeat BMP in AM 3. Avoid nephrotoxic meds 4. Message sent to nephrology Dr. 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

## 2021-11-17 NOTE — Assessment & Plan Note (Signed)
Continue Arava Also on biologic injectable every other month.

## 2021-11-17 NOTE — ED Notes (Signed)
Pt currently denies any N/V/D, pain globally.

## 2021-11-17 NOTE — Progress Notes (Signed)
Patient seen and examined this morning, admitted overnight, H&P reviewed and agree with assessment and plan.  She has persistent bleeding this morning, every time she goes to the bathroom there is blood coming out.  GI consulted, appreciate input.  As far as her renal function is concerned, her creatinine seems to have returned to baseline at 2.0.  I think she will need outpatient follow-up with nephrology for this.  Continue to monitor from GI standpoint  Camree Wigington M. Cruzita Lederer, MD, PhD [facility]  Between 7 am - 7 pm you can contact me via Amion (for emergencies) or [place] (non urgent matters).  I am not available 7 pm - 7 am, please contact night coverage MD/APP via Amion

## 2021-11-17 NOTE — Consult Note (Addendum)
Consultation  Referring Provider: Dr. Cruzita Lederer  Primary Care Physician:  Cassandria Anger, MD Primary Gastroenterologist: Dr. Havery Moros         Reason for Consultation: Rectal bleeding            HPI:   [name] is a 81 y.o. 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    Today, patient describes that yesterday she had a fairly normal bowel movement, though she does remember having to "bear down" and had immediate onset of bright red blood in the toilet around 7:00 AM.  She "made a mess of the bathroom" and eventually got it to stop by bending over, she then went and sat down in her chair for a couple of hours to watch TV and got up to have another movement, but had no stool and it was just bright red blood in the toilet and dripping.  This happened anytime she would get up to go to the bathroom, about 2 or 3 times at home and then she proceeded to the ER and is occurred 3 times in the ER since then but the last moving around 8 AM.  She is on Plavix with last dose yesterday morning.  Denies any rectal pain.  She was not experiencing any change in bowel habits before all this, does not typically run constipated or have diarrhea.  No abdominal pain, no dizziness or palpitations.    Denies fever or chills.  ER course: Nonbleeding external hemorrhoids but continues to have episodic bright red blood per rectum, CBC with hemoglobin from 11.9--> 11.3 overnight (baseline 12.1 a year ago), BUN elevated but appears to be slightly above baseline, 69 yesterday (seems to fluctuate between 40-60)  GI course: 03/29/2016 colonoscopy with 5 polyps  throughout the colon, diverticulosis and internal hemorrhoids; pathology with tubular adenomas, of note patient had a polyp within the appendix which was biopsied and was precancerous, it was not amenable to endoscopic resection it was recommended she have an appendectomy to remove that polyp as it was at risk for development of appendiceal cancer, she was referred to general surgery (underwent appendectomy 06/28/2016)  Past Medical History:  Diagnosis Date   Abdominal aortic aneurysm ([place])    REPAIRED IN 1996 BY [name]  AND HAS RECENTLY BEEN FOLLOWED BY [name]   Acquired asplenia     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 Most likely scar. Mild subendocardial ischemia. EF 71% LOW RISK.    Cervical disc disease    fracture   Chronic kidney disease    COPD (chronic obstructive pulmonary disease) ([place])    Diverticulosis    Hyperlipidemia    Hypertension    Hypothyroidism (acquired)    hypo   Myocardial infarction [facility]) Aug. 2016   TIA  Polymyalgia rheumatica ([place])    2011 Dr. Charlestine Night   Rheumatoid arthritis [facility]) 2011   Dr.Truslow; fracture knees, hands and wrists -    S/P CABG x 1 1996   CABG--LIMA-LAD for ostial LAD (not felt to be PCI amenable). EF NORMAL then; LIMA now atretic   Shortness of breath dyspnea    with exertion   Stroke [facility]) 11-2014   TIA    Urinary frequency     Past Surgical History:  Procedure Laterality Date   ABDOMINAL AORTIC ANEURYSM REPAIR  6144   Complicated by mesenteric artery stenosis and splenic artery infarction with acquired Asplenia   APPENDECTOMY     BUNIONECTOMY  07/2011   right foot   CARDIAC  CATHETERIZATION  2005   (Most recent CATH) - ostial LAD lesion 20-30% (down from 90% initially). Atretic LIMA. Minimal disease the RCA and Circumflex system.   CARPAL TUNNEL RELEASE Left    CATARACT EXTRACTION Bilateral    CERVICAL SPINE SURGERY     plate 2008 Dr. Saintclair Halsted   CORONARY ARTERY BYPASS GRAFT  1996   INCLUDED AN INTERNAL MAMMARY ARTERY TO THE LAD. EF WAS NORMAL   INGUINAL HERNIA REPAIR Right    LAPAROSCOPIC APPENDECTOMY N/A 06/28/2016   Procedure: APPENDECTOMY LAPAROSCOPIC;  Surgeon: Leighton Ruff, MD;  Location: [facility];  Service: General;  Laterality: N/A;   NM MYOVIEW LTD  March 2012   Fixed anteroseptal, apical and inferoapical defect with moderate size. Most likely scar. Mild subendocardial ischemia. EF 71% LOW RISK.    SPLENECTOMY     TRANSTHORACIC ECHOCARDIOGRAM  12/2014   [facility]: Normal LV size & function. EF 55-60%,    vagina polyp     VIDEO ASSISTED THORACOSCOPY (VATS)/WEDGE RESECTION Left 03/02/2015   Procedure: VIDEO ASSISTED THORACOSCOPY (VATS), MINI THORACOTOMY, LEFT UPPER LOBE WEDGE, TAKE DOWN OF INTERNAL MAMMARY LESIONS, PLACEMENT OF ON-Q PUMP;  Surgeon: Grace Isaac, MD;  Location: [place] [facility];  Service: Thoracic;  Laterality: Left;   VIDEO BRONCHOSCOPY N/A 03/02/2015   Procedure: BRONCHOSCOPY;  Surgeon: Grace Isaac, MD;  Location: [place];  Service: Thoracic;  Laterality: N/A;   VIDEO BRONCHOSCOPY WITH ENDOBRONCHIAL NAVIGATION N/A 10/08/2017   Procedure: VIDEO BRONCHOSCOPY WITH ENDOBRONCHIAL NAVIGATION WITH BIOPSIES OF LEFT UPPER LOBE AND LEFT LOWER LOBE;  Surgeon: Grace Isaac, MD;  Location: [place];  Service: Thoracic;  Laterality: N/A;    Family History  Problem Relation Age of Onset   Hypertension Mother    Diabetes Mother    Heart disease Mother    Hyperlipidemia Mother    Stroke Father    Hyperlipidemia Sister    Hypertension Sister    Hypertension Daughter    Cancer Paternal Uncle        Deceased from cancer not sure of site    Hypertension Son    Hyperlipidemia Son    Hyperlipidemia Son    Hypertension Son    Hyperlipidemia Son    Hypertension Son    Hypertension Other    Coronary artery disease Other    Asthma Neg Hx    Colon cancer Neg Hx     Social History   Tobacco Use   Smoking status: Former    Types: Cigarettes    Quit date: 12/04/1958    Years since quitting: 62.9    Passive exposure: Never   Smokeless tobacco: Never  Vaping Use   Vaping Use: Never used  Substance Use Topics   Alcohol use: Yes    Comment:  occassionally   Drug use: No    Prior to Admission medications   Medication Sig Start Date End Date Taking? Authorizing Provider  acetaminophen (TYLENOL) 500 MG tablet Take 500-1,000 mg by mouth daily as needed (pain).   Yes [provider]  albuterol (VENTOLIN HFA) 108 (90 Base) MCG/ACT inhaler Inhale 2 puffs into the lungs every 4 (four) hours as needed for wheezing or shortness of breath. 11/07/21  Yes Tanda Rockers, MD  amLODipine (NORVASC) 2.5 MG tablet Take 1 tablet (2.5 mg total) by mouth daily. 09/29/21  Yes Plotnikov, [name], MD  atorvastatin (LIPITOR) 80 MG tablet TAKE 1 TABLET EVERY DAY Patient taking differently: Take 80 mg by mouth daily. 10/21/21  Yes Plotnikov, [name], MD  Cholecalciferol (VITAMIN D3) 50 MCG (2000 UT) capsule Take 1 capsule (2,000 Units total) by mouth daily. 11/13/18  Yes Plotnikov, [name], MD  clopidogrel (PLAVIX) 75 MG tablet TAKE 1 TABLET EVERY DAY Patient taking differently: Take 75 mg by mouth daily. 06/03/21  Yes Plotnikov, [name], MD  Cyanocobalamin (B-12) 2500 MCG TABS one po qd Patient taking differently: Take 2,500 mcg by mouth daily. 10/07/19  Yes Plotnikov, [name], MD  famotidine (PEPCID) 20 MG tablet TAKE 1 TABLET AT BEDTIME Patient taking differently: Take 20 mg by mouth at bedtime. 07/25/21  Yes Plotnikov, [name], MD  gabapentin (NEURONTIN) 300 MG capsule TAKE 1 CAPSULE BY MOUTH THREE TIMES A DAY Patient taking  differently: Take 300 mg by mouth 2 (two) times daily. 06/02/21  Yes Tanda Rockers, MD  Golimumab [facility]) Every other month per Dr Trudie Reed   Yes [provider]  hydrochlorothiazide (HYDRODIURIL) 12.5 MG tablet TAKE 1 TABLET BY MOUTH EVERY DAY Patient taking differently: Take 12.5 mg by mouth daily. 04/08/21  Yes Tanda Rockers, MD  leflunomide (ARAVA) 20 MG tablet Take 20 mg by mouth daily.    Yes [provider]  levothyroxine (SYNTHROID) 88 MCG tablet Take 1 tablet (88 mcg total) by mouth daily before breakfast. 11/22/20  Yes Plotnikov, [name], MD  loperamide (IMODIUM A-D) 2 MG tablet Take 2 mg by mouth 4 (four) times daily as needed for diarrhea or loose stools.   Yes [provider]  losartan (COZAAR) 50 MG tablet TAKE 1 TABLET EVERY DAY (STOP LOSARTAN 100 MG) Patient taking differently: Take 50 mg by mouth daily. 06/03/21  Yes Plotnikov, [name], MD  metoprolol tartrate (LOPRESSOR) 50 MG tablet TAKE 1 AND 1/2 TABLETS TWICE DAILY Patient taking differently: Take 75 mg by mouth 2 (two) times daily. 11/22/20  Yes Plotnikov, [name], MD  pantoprazole (PROTONIX) 40 MG tablet TAKE 1 TABLET DAILY. TAKE 30 TO 60 MINUTES BEFORE FIRST MEAL OF THE DAY Patient taking differently: Take 40 mg by mouth daily. 07/25/21  Yes Plotnikov, [name], MD  psyllium (METAMUCIL) 58.6 % packet Take 1 packet by mouth daily.   Yes [provider]  Tiotropium Bromide-Olodaterol (STIOLTO RESPIMAT) 2.5-2.5 MCG/ACT AERS INHALE 2 PUFFS BY MOUTH INTO THE LUNGS DAILY Patient taking differently: Inhale 2 puffs by mouth into the lungs daily 01/18/21  Yes Tanda Rockers, MD  triamcinolone (NASACORT) 55 MCG/ACT AERO nasal inhaler Place 1 spray into the nose at bedtime.   Yes [provider]    Current Facility-Administered Medications  Medication Dose Route Frequency Provider Last Rate Last Admin   acetaminophen (TYLENOL) tablet 650 mg  650 mg Oral Q6H PRN Etta Quill, DO  Or   acetaminophen (TYLENOL) suppository 650 mg  650 mg Rectal Q6H PRN Etta Quill, DO       albuterol (PROVENTIL) (2.5 MG/3ML) 0.083% nebulizer solution 2.5 mg  2.5 mg Nebulization Q4H PRN Veryl Speak, MD       amLODipine (NORVASC) tablet 2.5 mg  2.5 mg Oral Daily Alcario Drought, Jared M, DO       arformoterol [facility]) nebulizer solution 15 mcg  15 mcg Nebulization BID Alcario Drought, Jared M, DO       And   umeclidinium bromide (INCRUSE ELLIPTA) 62.5 MCG/ACT 1 puff  1 puff Inhalation Daily Alcario Drought, Jared M, DO       atorvastatin (LIPITOR) tablet 80 mg  80 mg Oral Daily Alcario Drought, Jared M, DO       famotidine (PEPCID) tablet 20 mg  20 mg Oral QHS Jennette Kettle M, DO       gabapentin (NEURONTIN) capsule 300 mg  300 mg Oral BID Jennette Kettle M, DO       labetalol (NORMODYNE) injection 10-20 mg  10-20 mg Intravenous Q2H PRN Jennette Kettle M, DO       leflunomide (ARAVA) tablet 20 mg  20 mg Oral Daily Alcario Drought, Jared M, DO       levothyroxine (SYNTHROID) tablet 88 mcg  88 mcg Oral QAC breakfast Etta Quill, DO   88 mcg at 11/17/21 0600   metoprolol tartrate (LOPRESSOR) tablet 75 mg  75 mg Oral BID Etta Quill, DO       ondansetron [facility]) tablet 4 mg  4 mg Oral Q6H PRN Etta Quill, DO       Or   ondansetron [facility]) injection 4 mg  4 mg Intravenous Q6H PRN Etta Quill, DO       pantoprazole (PROTONIX) EC tablet 40 mg  40 mg Oral Daily Etta Quill, DO       Current Outpatient Medications  Medication Sig Dispense Refill   acetaminophen (TYLENOL) 500 MG tablet Take 500-1,000 mg by mouth daily as needed (pain).     albuterol (VENTOLIN HFA) 108 (90 Base) MCG/ACT inhaler Inhale 2 puffs into the lungs every 4 (four) hours as needed for wheezing or shortness of breath. 8 g 2   amLODipine (NORVASC) 2.5 MG tablet Take 1 tablet (2.5 mg total) by mouth daily. 90 tablet 2   atorvastatin (LIPITOR) 80 MG tablet TAKE 1 TABLET EVERY DAY (Patient taking differently: Take 80 mg by mouth  daily.) 90 tablet 2   Cholecalciferol (VITAMIN D3) 50 MCG (2000 UT) capsule Take 1 capsule (2,000 Units total) by mouth daily. 100 capsule 3   clopidogrel (PLAVIX) 75 MG tablet TAKE 1 TABLET EVERY DAY (Patient taking differently: Take 75 mg by mouth daily.) 90 tablet 1   Cyanocobalamin (B-12) 2500 MCG TABS one po qd (Patient taking differently: Take 2,500 mcg by mouth daily.) 100 tablet 3   famotidine (PEPCID) 20 MG tablet TAKE 1 TABLET AT BEDTIME (Patient taking differently: Take 20 mg by mouth at bedtime.) 90 tablet 3   gabapentin (NEURONTIN) 300 MG capsule TAKE 1 CAPSULE BY MOUTH THREE TIMES A DAY (Patient taking differently: Take 300 mg by mouth 2 (two) times daily.) 270 capsule 1   Golimumab (SIMPONI [place]) Every other month per Dr Trudie Reed     hydrochlorothiazide (HYDRODIURIL) 12.5 MG tablet TAKE 1 TABLET BY MOUTH EVERY DAY (Patient taking differently: Take 12.5 mg by mouth daily.) 90 tablet 3   leflunomide (ARAVA) 20 MG tablet Take 20  mg by mouth daily.      levothyroxine (SYNTHROID) 88 MCG tablet Take 1 tablet (88 mcg total) by mouth daily before breakfast. 90 tablet 3   loperamide (IMODIUM A-D) 2 MG tablet Take 2 mg by mouth 4 (four) times daily as needed for diarrhea or loose stools.     losartan (COZAAR) 50 MG tablet TAKE 1 TABLET EVERY DAY (STOP LOSARTAN 100 MG) (Patient taking differently: Take 50 mg by mouth daily.) 90 tablet 1   metoprolol tartrate (LOPRESSOR) 50 MG tablet TAKE 1 AND 1/2 TABLETS TWICE DAILY (Patient taking differently: Take 75 mg by mouth 2 (two) times daily.) 270 tablet 3   pantoprazole (PROTONIX) 40 MG tablet TAKE 1 TABLET DAILY. TAKE 30 TO 60 MINUTES BEFORE FIRST MEAL OF THE DAY (Patient taking differently: Take 40 mg by mouth daily.) 90 tablet 3   psyllium (METAMUCIL) 58.6 % packet Take 1 packet by mouth daily.     Tiotropium Bromide-Olodaterol (STIOLTO RESPIMAT) 2.5-2.5 MCG/ACT AERS INHALE 2 PUFFS BY MOUTH INTO THE LUNGS DAILY (Patient taking differently: Inhale 2 puffs  by mouth into the lungs daily) 4 g 11   triamcinolone (NASACORT) 55 MCG/ACT AERO nasal inhaler Place 1 spray into the nose at bedtime.      Allergies as of 11/16/2021 - Review Complete 11/16/2021  Allergen Reaction Noted   Asa [aspirin]  02/15/2020   Aspirin Other (See Comments)    Codeine Nausea And Vomiting    Nitroglycerin Other (See Comments) 11/08/2010   Nitroglycerin  02/15/2020     Review of Systems:    Constitutional: No weight loss, fever or chills Skin: No rash Cardiovascular: No chest pain  Respiratory: No SOB  Gastrointestinal: See HPI and otherwise negative Genitourinary: No dysuria  Neurological: No headache, dizziness or syncope Musculoskeletal: No new muscle or joint pain Hematologic: No bruising Psychiatric: No history of depression or anxiety    Physical Exam:  Vital signs in last 24 hours: Temp:  [97.6 F (36.4 C)-97.7 F (36.5 C)] 97.7 F (36.5 C) (07/20 0700) Pulse Rate:  [57-78] 78 (07/20 0800) Resp:  [15-23] 15 (07/20 0800) BP: (138-248)/(49-130) 138/80 (07/20 0800) SpO2:  [93 %-100 %] 93 % (07/20 0800) Weight:  [68 kg] 68 kg (07/19 2307)   General:   Pleasant Elderly Caucasian female appears to be in NAD, Well developed, Well nourished, alert and cooperative Head:  Normocephalic and atraumatic. Eyes:   PEERL, EOMI. No icterus. Conjunctiva pink. Ears:  Normal auditory acuity. Neck:  Supple Throat: Oral cavity and pharynx without inflammation, swelling or lesion. Teeth in good condition. Lungs: Respirations even and unlabored. Lungs clear to auscultation bilaterally.   No wheezes, crackles, or rhonchi.  Heart: Normal S1, S2. No MRG. Regular rate and rhythm. No peripheral edema, cyanosis or pallor.  Abdomen:  Soft, nondistended, nontender. No rebound or guarding. Normal bowel sounds. No appreciable masses or hepatomegaly. Rectal: External: brb residue on rectum, external hemorrhoids, decreased sphincter tone; Internal: tenderness to palpation  posteriorly, ? Anal fissure Msk:  Symmetrical without gross deformities. Peripheral pulses intact.  Extremities:  Without edema, no deformity or joint abnormality.  Neurologic:  Alert and  oriented x4;  grossly normal neurologically. Skin:   Dry and intact without significant lesions or rashes. Psychiatric: Demonstrates good judgement and reason without abnormal affect or behaviors.   LAB RESULTS: Recent Labs    11/16/21 2356 11/17/21 0500  WBC 8.9 9.2  HGB 11.9* 11.3*  HCT 35.5* 34.3*  PLT 435* 398   BMET Recent  Labs    11/16/21 2356 11/17/21 0500  NA 138 141  K 4.2 3.4*  CL 103 111  CO2 25 21*  GLUCOSE 101* 99  BUN 69* 58*  CREATININE 2.48* 2.09*  CALCIUM 9.8 8.3*   LFT Recent Labs    11/16/21 2356  PROT 6.7  ALBUMIN 3.6  AST 17  ALT 14  ALKPHOS 68  BILITOT 0.7     Impression / Plan:   Impression: 1.  Bright red blood per rectum: At least 5 or 6 episodes over the past 24 hours, initially started with passing a stool and since then is just bright red blood that seems to drip in the toilet, rectal exam with likely posterior fissure and some tenderness thought to be the cause 2.  CKD stage IV 3.  Hypertension 4.  Rheumatoid arthritis 5.  CAD on Plavix: Plavix on hold, last dose 7/19 AM  Plan: 1.  At time of rectal exam there is question of posterior fissure, I am unable to get a really great look at it but patient is tender posteriorly and it does look like a possibility.  Certainly bleeding from this would be exacerbated with her on Plavix. 2.  For now continue to observe and monitor hemoglobin with transfusion as needed less than 7 3.  Would expect bleeding to slow down over the next 24 hours, will let Dr. Fuller Plan also take a look to decide if he feels like there is a fissure.  Unfortunately we do not have treatment available for fissure in the hospital, but would need to prescribe Diltiazem ointment 3 times daily at discharge for the next 6 to 8 weeks. 4.   Patient can stay on clear liquid diet for now though doubt she will need endoscopic work-up. 5.  Patient needs to keep stools soft, will add Colace stool softener once daily 6.  Patient denies any tenderness in her rectum unless someone is pressing on that area, do not think she needs lidocaine.  Thank you for your kind consultation, we will continue to follow.  Lavone Nian Lemmon  11/17/2021, 9:05 AM    Attending Physician Note   I have taken a history, reviewed the chart and examined the patient. I performed a substantive portion of this encounter, including complete performance of at least one of the key components, in conjunction with the APP. I agree with the APP's note, impression and recommendations with my edits. My additional impressions and recommendations are as follows.   *Hematochezia, dark red blood and clots, suspected diverticular bleed. Colonoscopy in Nov 2017 showed left colon diverticulosis. Suspected posterior anal fissure on DRE is potential source of bleeding however unusual to have this amount of bleeding   *Acute blood loss anemia. Trend CBC.   *Personal history of adenomatous colon polyps including one in the appendix requiring appendectomy in 2018. Last colonoscopy in 2017. No plan for surveillance colonoscopies due to age, comorbidities.   *CAD on Plavix which is being held.   *CDK  Lucio Edward, MD [facility] See AMION, [facility], for our on call provider

## 2021-11-17 NOTE — Assessment & Plan Note (Signed)
Cont amlodipine Cont BB PRN labetalol Got 1 dose clonidine in ED Hold ARB and HCTZ due to renal fxn.

## 2021-11-17 NOTE — ED Provider Notes (Signed)
[facility] DEPT Provider Note   CSN: [id number] Arrival date & time: 11/16/21  2258     History  Chief Complaint  Patient presents with   Rectal Bleeding    [name] is a 81 y.o. female.  Patient is an 81 year old female with history of coronary artery disease status post CABG, hypertension, asplenia, hemorrhoids.  Patient presenting today with complaints of rectal bleeding.  She had an episode of this earlier today while attempting to pass stool.  This seemed to resolve, then recurred later this evening.  She denies to me she is having any rectal pain, abdominal pain, fevers, lightheadedness, or shortness of breath.  The history is provided by the patient.  Rectal Bleeding Quality:  Bright red Timing:  Intermittent Chronicity:  New Relieved by:  Nothing Worsened by:  Nothing      Home Medications Prior to Admission medications   Medication Sig Start Date End Date Taking? Authorizing Provider  acetaminophen (TYLENOL) 500 MG tablet Take 500 mg by mouth every 6 (six) hours as needed (pain).     [provider]  albuterol (VENTOLIN HFA) 108 (90 Base) MCG/ACT inhaler Inhale 2 puffs into the lungs every 4 (four) hours as needed for wheezing or shortness of breath. 11/07/21   Tanda Rockers, MD  amLODipine (NORVASC) 2.5 MG tablet Take 1 tablet (2.5 mg total) by mouth daily. 09/29/21   Plotnikov, [name], MD  atorvastatin (LIPITOR) 80 MG tablet TAKE 1 TABLET EVERY DAY 10/21/21   Plotnikov, [name], MD  Chlorpheniramine Maleate (CHLORPHEN PO) Take by mouth.    [provider]  Cholecalciferol (VITAMIN D3) 50 MCG (2000 UT) capsule Take 1 capsule (2,000 Units total) by mouth daily. 11/13/18   Plotnikov, [name], MD  clopidogrel (PLAVIX) 75 MG tablet TAKE 1 TABLET EVERY DAY 06/03/21   Plotnikov, [name], MD  Cyanocobalamin (B-12) 2500 MCG TABS one po qd 10/07/19   Plotnikov, [name], MD  famotidine (PEPCID) 20 MG tablet TAKE 1  TABLET AT BEDTIME 07/25/21   Plotnikov, [name], MD  gabapentin (NEURONTIN) 300 MG capsule TAKE 1 CAPSULE BY MOUTH THREE TIMES A DAY 06/02/21   Tanda Rockers, MD  Golimumab [facility]) Every other month per Dr Trudie Reed    [provider]  hydrochlorothiazide (HYDRODIURIL) 12.5 MG tablet TAKE 1 TABLET BY MOUTH EVERY DAY 04/08/21   Tanda Rockers, MD  leflunomide (ARAVA) 20 MG tablet Take 20 mg by mouth daily.     [provider]  levothyroxine (SYNTHROID) 88 MCG tablet Take 1 tablet (88 mcg total) by mouth daily before breakfast. 11/22/20   Plotnikov, [name], MD  loperamide (IMODIUM A-D) 2 MG tablet Take 2 mg by mouth 4 (four) times daily as needed for diarrhea or loose stools.    [provider]  losartan (COZAAR) 50 MG tablet TAKE 1 TABLET EVERY DAY (STOP LOSARTAN 100 MG) 06/03/21   Plotnikov, [name], MD  methylPREDNISolone (MEDROL DOSEPAK) 4 MG TBPK tablet As directed 03/15/21   Plotnikov, [name], MD  metoprolol tartrate (LOPRESSOR) 50 MG tablet TAKE 1 AND 1/2 TABLETS TWICE DAILY 11/22/20   Plotnikov, [name], MD  pantoprazole (PROTONIX) 40 MG tablet TAKE 1 TABLET DAILY. TAKE 30 TO 60 MINUTES BEFORE FIRST MEAL OF THE DAY 07/25/21   Plotnikov, [name], MD  psyllium (METAMUCIL) 58.6 % packet Take 1 packet by mouth daily.    [provider]  Tiotropium Bromide-Olodaterol (STIOLTO RESPIMAT) 2.5-2.5 MCG/ACT  AERS INHALE 2 PUFFS BY MOUTH INTO THE LUNGS DAILY 01/18/21   Tanda Rockers, MD  triamcinolone (NASACORT) 55 MCG/ACT AERO nasal inhaler Place 1 spray into the nose daily.     [provider]      Allergies    Asa [aspirin], Aspirin, Codeine, Nitroglycerin, and Nitroglycerin    Review of Systems   Review of Systems  Gastrointestinal:  Positive for hematochezia.  All other systems reviewed and are negative.   Physical Exam Updated Vital Signs BP (!) 172/53 (BP Location: Left Arm)   Pulse (!) 57   Temp 97.6 F (36.4 C) (Oral)   Resp 18    Ht '5\' 3"'$  (1.6 m)   Wt 68 kg   SpO2 100%   BMI 26.57 kg/m  Physical Exam Vitals and nursing note reviewed.  Constitutional:      General: She is not in acute distress.    Appearance: She is well-developed. She is not diaphoretic.  HENT:     Head: Normocephalic and atraumatic.  Cardiovascular:     Rate and Rhythm: Normal rate and regular rhythm.     Heart sounds: No murmur heard.    No friction rub. No gallop.  Pulmonary:     Effort: Pulmonary effort is normal. No respiratory distress.     Breath sounds: Normal breath sounds. No wheezing.  Abdominal:     General: Bowel sounds are normal. There is no distension.     Palpations: Abdomen is soft.     Tenderness: There is no abdominal tenderness.  Genitourinary:    Comments: There are several small external hemorrhoids noted, however none appear inflamed or actively bleeding.  Rectal exam reveals scant stool and no significant blood. Musculoskeletal:        General: Normal range of motion.     Cervical back: Normal range of motion and neck supple.  Skin:    General: Skin is warm and dry.  Neurological:     General: No focal deficit present.     Mental Status: She is alert and oriented to person, place, and time.     ED Results / Procedures / Treatments   Labs (all labs ordered are listed, but only abnormal results are displayed) Labs Reviewed  COMPREHENSIVE METABOLIC PANEL  CBC  POC OCCULT BLOOD, ED  TYPE AND SCREEN    EKG None  Radiology No results found.  Procedures Procedures    Medications Ordered in ED Medications - No data to display  ED Course/ Medical Decision Making/ A&P  This patient presents to the ED for concern of rectal bleeding and elevated blood pressure, this involves an extensive number of treatment options, and is a complaint that carries with it a high risk of complications and morbidity.  The differential diagnosis includes bleeding diverticulum, bleeding hemorrhoids   Co morbidities  that complicate the patient evaluation  None   Additional history obtained:  No additional history or external records needed   Lab Tests:  I Ordered, and personally interpreted labs.  The pertinent results include: Hemoglobin of 11.9 and slightly worsening renal function, but laboratory studies otherwise unremarkable   Imaging Studies ordered:  No imaging studies obtained   Cardiac Monitoring: / EKG:  The patient was maintained on a cardiac monitor.  I personally viewed and interpreted the cardiac monitored which showed an underlying rhythm of: Sinus rhythm   Consultations Obtained:  I requested consultation with the hospitalist,  and discussed lab and imaging findings as well as pertinent plan -  they recommend: Admission   Problem List / ED Course / Critical interventions / Medication management  Patient presenting with elevated blood pressure and rectal bleeding.  The bleeding is most likely related to internal hemorrhoid, but she has had several episodes of this and I feel as though it should be observed.  Patient also has blood pressure well over 149 systolic.  She was given clonidine with little improvement.  She was then given a dose of IV labetalol with significant improvement in the blood pressure. I have reviewed the patients home medicines and have made adjustments as needed   Social Determinants of Health:  None   Test / Admission - Considered:  I feel as though the patient will require admission for serial hemoglobins and investigation into the source of her bleeding.  She will also require close management of her blood pressure.  CRITICAL CARE Performed by: Veryl Speak Total critical care time: 35 minutes Critical care time was exclusive of separately billable procedures and treating other patients. Critical care was necessary to treat or prevent imminent or life-threatening deterioration. Critical care was time spent personally by me on the following  activities: development of treatment plan with patient and/or surrogate as well as nursing, discussions with consultants, evaluation of patient's response to treatment, examination of patient, obtaining history from patient or surrogate, ordering and performing treatments and interventions, ordering and review of laboratory studies, ordering and review of radiographic studies, pulse oximetry and re-evaluation of patient's condition.   Final Clinical Impression(s) / ED Diagnoses Final diagnoses:  None    Rx / DC Orders ED Discharge Orders     None         Veryl Speak, MD 11/17/21 7026

## 2021-11-17 NOTE — ED Notes (Signed)
Notified EDP in regards to pt's HTN.

## 2021-11-17 NOTE — H&P (Signed)
History and Physical    Patient: [name] EXH:[id number] DOB: 1941-03-05 DOA: 11/16/2021 DOS: the patient was seen and examined on 11/17/2021 PCP: Plotnikov, [name], MD  Patient coming from: Home  Chief Complaint:  Chief Complaint  Patient presents with   Rectal Bleeding   HPI: [name] is a 81 y.o. female with medical history significant of ruptured AAA in 1996.  Ruptured splenic artery aneurysms s/p splenectomy.  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  RA on DMARD + biologic therapy, HTN, CKD, CAD.  Pt with h/o bleeding hemorrhoid in past.  Pt on chronic plavix.  Pt had hard BM earlier yesterday.  After this she had immediate onset of BRBPR which was persistent.  Multiple episodes thus far.  In to ED with ongoing bleeding episodes.  Pt denies any abdominal pain.  No CP no SOB.  In ED: has non-bleeding external hemorrhoids, but continues to have episodic BRBPR.   Review of Systems: As mentioned in the history of present illness. All other systems reviewed and are negative. 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 Most likely scar. Mild subendocardial ischemia. EF 71% LOW RISK.    Cervical disc disease    fracture   Chronic kidney disease    COPD (chronic obstructive pulmonary disease) ([place])    Diverticulosis    Hyperlipidemia    Hypertension     Hypothyroidism (acquired)    hypo   Myocardial infarction [facility]) Aug. 2016   TIA   Polymyalgia rheumatica ([place])    2011 Dr. Charlestine Night   Rheumatoid arthritis [facility]) 2011   Dr.Truslow; fracture knees, hands and wrists -    S/P CABG x 1 1996   CABG--LIMA-LAD for ostial LAD (not felt to be PCI amenable). EF NORMAL then; LIMA now atretic   Shortness of breath dyspnea    with exertion   Stroke [facility]) 11-2014   TIA    Urinary frequency    Past Surgical History:  Procedure Laterality Date   ABDOMINAL AORTIC ANEURYSM REPAIR  3810   Complicated by mesenteric artery stenosis and splenic artery infarction with acquired Asplenia   APPENDECTOMY     BUNIONECTOMY  07/2011   right foot   CARDIAC CATHETERIZATION  2005   (Most recent CATH) - ostial LAD lesion 20-30% (down from 90% initially). Atretic LIMA. Minimal disease the RCA and Circumflex system.   CARPAL TUNNEL RELEASE Left    CATARACT EXTRACTION Bilateral    CERVICAL SPINE SURGERY     plate 2008 Dr. Saintclair Halsted   CORONARY ARTERY BYPASS GRAFT  1996   INCLUDED AN INTERNAL MAMMARY ARTERY TO THE LAD. EF WAS NORMAL   INGUINAL HERNIA REPAIR Right    LAPAROSCOPIC APPENDECTOMY N/A 06/28/2016   Procedure: APPENDECTOMY LAPAROSCOPIC;  Surgeon: Leighton Ruff, MD;  Location: [facility];  Service: General;  Laterality: N/A;   NM MYOVIEW LTD  March 2012   Fixed anteroseptal, apical and inferoapical defect with moderate size. Most likely scar. Mild subendocardial ischemia. EF 71% LOW RISK.    SPLENECTOMY     TRANSTHORACIC ECHOCARDIOGRAM  12/2014   [facility]: Normal LV size & function. EF 55-60%,    vagina polyp     VIDEO ASSISTED THORACOSCOPY (VATS)/WEDGE RESECTION Left 03/02/2015   Procedure: VIDEO ASSISTED THORACOSCOPY (VATS), MINI THORACOTOMY, LEFT UPPER LOBE WEDGE, TAKE DOWN OF INTERNAL MAMMARY LESIONS, PLACEMENT OF ON-Q PUMP;  Surgeon: Grace Isaac, MD;  Location: [facility];  Service: Thoracic;  Laterality: Left;   VIDEO BRONCHOSCOPY N/A 03/02/2015    Procedure: BRONCHOSCOPY;  Surgeon: Grace Isaac, MD;  Location: [facility];  Service: Thoracic;  Laterality: N/A;   VIDEO BRONCHOSCOPY WITH ENDOBRONCHIAL NAVIGATION N/A 10/08/2017   Procedure: VIDEO BRONCHOSCOPY WITH ENDOBRONCHIAL NAVIGATION WITH BIOPSIES OF LEFT UPPER LOBE AND LEFT LOWER LOBE;  Surgeon: Grace Isaac, MD;  Location: [place];  Service: Thoracic;  Laterality: N/A;   Social History:  reports that she quit smoking about 62 years ago. Her smoking use included cigarettes. She has never been exposed to tobacco smoke. She has never used smokeless tobacco. She reports current alcohol use. She reports that she does not use drugs.  Allergies  Allergen Reactions   Aspirin Other (See Comments)    Hurts stomach   Codeine Nausea And Vomiting   Nitroglycerin Other (See Comments)    Heart rate drops    Family History  Problem Relation Age of Onset   Hypertension Mother    Diabetes Mother    Heart disease Mother    Hyperlipidemia Mother    Stroke Father    Hyperlipidemia Sister    Hypertension Sister    Hypertension Daughter    Cancer Paternal Uncle        Deceased from cancer not sure of site   Hypertension Son    Hyperlipidemia Son    Hyperlipidemia Son    Hypertension Son    Hyperlipidemia Son    Hypertension Son    Hypertension Other    Coronary artery disease Other    Asthma Neg Hx    Colon cancer Neg Hx     Prior to Admission medications   Medication Sig Start Date End Date Taking? Authorizing Provider  acetaminophen (TYLENOL) 500 MG tablet Take 500-1,000 mg by mouth daily as needed (pain).   Yes [provider]  albuterol (VENTOLIN HFA) 108 (90 Base) MCG/ACT inhaler Inhale 2 puffs into the lungs every 4 (four) hours as needed for wheezing or shortness of breath. 11/07/21  Yes Tanda Rockers, MD  amLODipine (NORVASC) 2.5 MG tablet Take 1 tablet (2.5 mg total) by mouth daily. 09/29/21  Yes Plotnikov, [name], MD  atorvastatin (LIPITOR) 80 MG tablet TAKE 1  TABLET EVERY DAY Patient taking differently: Take 80 mg by mouth daily. 10/21/21  Yes Plotnikov, [name], MD  Cholecalciferol (VITAMIN D3) 50 MCG (2000 UT) capsule Take 1 capsule (2,000 Units total) by mouth daily. 11/13/18  Yes Plotnikov, [name], MD  clopidogrel (PLAVIX) 75 MG tablet TAKE 1 TABLET EVERY DAY Patient taking differently: Take 75 mg by mouth daily. 06/03/21  Yes Plotnikov, [name], MD  Cyanocobalamin (B-12) 2500 MCG TABS one po qd Patient taking differently: Take 2,500 mcg by mouth daily. 10/07/19  Yes Plotnikov, [name], MD  famotidine (PEPCID) 20 MG tablet TAKE 1 TABLET AT BEDTIME Patient taking differently:  Take 20 mg by mouth at bedtime. 07/25/21  Yes Plotnikov, [name], MD  gabapentin (NEURONTIN) 300 MG capsule TAKE 1 CAPSULE BY MOUTH THREE TIMES A DAY Patient taking differently: Take 300 mg by mouth 2 (two) times daily. 06/02/21  Yes Tanda Rockers, MD  Golimumab [facility]) Every other month per Dr Trudie Reed   Yes [provider]  hydrochlorothiazide (HYDRODIURIL) 12.5 MG tablet TAKE 1 TABLET BY MOUTH EVERY DAY Patient taking differently: Take 12.5 mg by mouth daily. 04/08/21  Yes Tanda Rockers, MD  leflunomide (ARAVA) 20 MG tablet Take 20 mg by mouth daily.    Yes [provider]  levothyroxine (SYNTHROID) 88 MCG tablet Take 1 tablet (88 mcg total) by mouth daily before breakfast. 11/22/20  Yes Plotnikov, [name], MD  loperamide (IMODIUM A-D) 2 MG tablet Take 2 mg by mouth 4 (four) times daily as needed for diarrhea or loose stools.   Yes [provider]  losartan (COZAAR) 50 MG tablet TAKE 1 TABLET EVERY DAY (STOP LOSARTAN 100 MG) Patient taking differently: Take 50 mg by mouth daily. 06/03/21  Yes Plotnikov, [name], MD  metoprolol tartrate (LOPRESSOR) 50 MG tablet TAKE 1 AND 1/2 TABLETS TWICE DAILY Patient taking differently: Take 75 mg by mouth 2 (two) times daily. 11/22/20  Yes Plotnikov, [name], MD  pantoprazole (PROTONIX) 40 MG tablet  TAKE 1 TABLET DAILY. TAKE 30 TO 60 MINUTES BEFORE FIRST MEAL OF THE DAY Patient taking differently: Take 40 mg by mouth daily. 07/25/21  Yes Plotnikov, [name], MD  psyllium (METAMUCIL) 58.6 % packet Take 1 packet by mouth daily.   Yes [provider]  Tiotropium Bromide-Olodaterol (STIOLTO RESPIMAT) 2.5-2.5 MCG/ACT AERS INHALE 2 PUFFS BY MOUTH INTO THE LUNGS DAILY Patient taking differently: Inhale 2 puffs by mouth into the lungs daily 01/18/21  Yes Tanda Rockers, MD  triamcinolone (NASACORT) 55 MCG/ACT AERO nasal inhaler Place 1 spray into the nose at bedtime.   Yes [provider]    Physical Exam: Vitals:   11/17/21 0230 11/17/21 0245 11/17/21 0305 11/17/21 0400  BP: (!) 206/71 (!) 207/66 (!) 193/56 (!) 145/49  Pulse: 72 72 71 69  Resp: (!) 23 (!) '21 18 15  '$ Temp:   97.6 F (36.4 C)   TempSrc:   Oral   SpO2: 94% 97% 100% 98%  Weight:      Height:       Constitutional: NAD, calm, comfortable Eyes: PERRL, lids and conjunctivae normal ENMT: Mucous membranes are moist. Posterior pharynx clear of any exudate or lesions.Normal dentition.  Neck: normal, supple, no masses, no thyromegaly Respiratory: clear to auscultation bilaterally, no wheezing, no crackles. Normal respiratory effort. No accessory muscle use.  Cardiovascular: Regular rate and rhythm, no murmurs / rubs / gallops. No extremity edema. 2+ pedal pulses. No carotid bruits.  Abdomen: no tenderness, no masses palpated. No hepatosplenomegaly. Bowel sounds positive.  Musculoskeletal: no clubbing / cyanosis. No joint deformity upper and lower extremities. Good ROM, no contractures. Normal muscle tone.  Skin: no rashes, lesions, ulcers. No induration Neurologic: CN 2-12 grossly intact. Sensation intact, DTR normal. Strength 5/5 in all 4.  Psychiatric: Normal judgment and insight. Alert and oriented x 3. Normal mood.   Data Reviewed:       Latest Ref Rng & Units 11/16/2021   11:56 PM 09/07/2020   11:26 AM  02/15/2020    1:45 PM  CBC  WBC 4.0 - 10.5 K/uL 8.9  7.5  Hemoglobin 12.0 - 15.0 g/dL 11.9  12.1  12.2   Hematocrit 36.0 - 46.0 % 35.5  36.7  36.0   Platelets 150 - 400 K/uL 435  314.0        Latest Ref Rng & Units 11/16/2021   11:56 PM 06/21/2021   10:25 AM 12/09/2020   11:16 AM  BMP  Glucose 70 - 99 mg/dL 101  104  86   BUN 8 - 23 mg/dL 69  49  60   Creatinine 0.44 - 1.00 mg/dL 2.48  2.07  2.18   Sodium 135 - 145 mmol/L 138  140  140   Potassium 3.5 - 5.1 mmol/L 4.2  4.5  4.7   Chloride 98 - 111 mmol/L 103  102  103   CO2 22 - 32 mmol/L '25  31  29   '$ Calcium 8.9 - 10.3 mg/dL 9.8  9.8  9.9      Assessment and Plan: * BRBPR (bright red blood per rectum) HPI highly suggestive for hemorrhoidal bleeding, perhaps internal hemorrhoid from hard BM earlier in day. Complete lack of any abdominal pain argues against any sort of major vascular ischemic injury. Putting pt in observation to monitor HGB Clear liquid diet. Hold plavix If bleeding persists, call GI in AM.  CKD (chronic kidney disease) stage 4, GFR 15-29 ml/min (HCC) Looks like pt with progressively worsening renal function over past 3 years: Creat today of 2.5 is up from 2.0 in Feb, 2.0 last year, 1.7 in 2021, and 1.5 in 2020. ? HTN nephropathy worsening, or perhaps undiagnosed 'partially treated' vasculitis given h/o multiple abdominal artery aneurysms, pt on DMARD + Biologic for RA. Holding HCTZ and ARB as nephrotoxic Repeat BMP in AM Avoid nephrotoxic meds Message sent to nephrology Dr. Joelyn Oms: will let them decide if they want to do IP consult vs just follow up as new pt in office (nephro referral indicated for CKD 4 if nothing else).  Essential hypertension Cont amlodipine Cont BB PRN labetalol Got 1 dose clonidine in ED Hold ARB and HCTZ due to renal fxn.  Rheumatoid arthritis ([place]) Continue Arava Also on biologic injectable every other month.      Advance Care Planning:   Code Status: Full  Code  Consults: Message sent to Dr. Joelyn Oms, he replied that they would "sort it out in the morning".  Family Communication: Husband at bedside  Severity of Illness: The appropriate patient status for this patient is OBSERVATION. Observation status is judged to be reasonable and necessary in order to provide the required intensity of service to ensure the patient's safety. The patient's presenting symptoms, physical exam findings, and initial radiographic and laboratory data in the context of their medical condition is felt to place them at decreased risk for further clinical deterioration. Furthermore, it is anticipated that the patient will be medically stable for discharge from the hospital within 2 midnights of admission.   Author: Etta Quill., DO 11/17/2021 5:05 AM  For on call review [email or web address].

## 2021-11-18 ENCOUNTER — Observation Stay (HOSPITAL_COMMUNITY): Payer: Medicare PPO

## 2021-11-18 DIAGNOSIS — J449 Chronic obstructive pulmonary disease, unspecified: Secondary | ICD-10-CM | POA: Diagnosis present

## 2021-11-18 DIAGNOSIS — Z7951 Long term (current) use of inhaled steroids: Secondary | ICD-10-CM | POA: Diagnosis not present

## 2021-11-18 DIAGNOSIS — I252 Old myocardial infarction: Secondary | ICD-10-CM | POA: Diagnosis not present

## 2021-11-18 DIAGNOSIS — K921 Melena: Secondary | ICD-10-CM

## 2021-11-18 DIAGNOSIS — K625 Hemorrhage of anus and rectum: Secondary | ICD-10-CM | POA: Diagnosis present

## 2021-11-18 DIAGNOSIS — K5731 Diverticulosis of large intestine without perforation or abscess with bleeding: Secondary | ICD-10-CM | POA: Diagnosis present

## 2021-11-18 DIAGNOSIS — M353 Polymyalgia rheumatica: Secondary | ICD-10-CM | POA: Diagnosis present

## 2021-11-18 DIAGNOSIS — Z8249 Family history of ischemic heart disease and other diseases of the circulatory system: Secondary | ICD-10-CM | POA: Diagnosis not present

## 2021-11-18 DIAGNOSIS — I129 Hypertensive chronic kidney disease with stage 1 through stage 4 chronic kidney disease, or unspecified chronic kidney disease: Secondary | ICD-10-CM | POA: Diagnosis present

## 2021-11-18 DIAGNOSIS — D62 Acute posthemorrhagic anemia: Secondary | ICD-10-CM

## 2021-11-18 DIAGNOSIS — E039 Hypothyroidism, unspecified: Secondary | ICD-10-CM | POA: Diagnosis present

## 2021-11-18 DIAGNOSIS — I2510 Atherosclerotic heart disease of native coronary artery without angina pectoris: Secondary | ICD-10-CM | POA: Diagnosis present

## 2021-11-18 DIAGNOSIS — E785 Hyperlipidemia, unspecified: Secondary | ICD-10-CM | POA: Diagnosis present

## 2021-11-18 DIAGNOSIS — K922 Gastrointestinal hemorrhage, unspecified: Secondary | ICD-10-CM | POA: Diagnosis present

## 2021-11-18 DIAGNOSIS — Z885 Allergy status to narcotic agent status: Secondary | ICD-10-CM | POA: Diagnosis not present

## 2021-11-18 DIAGNOSIS — Z951 Presence of aortocoronary bypass graft: Secondary | ICD-10-CM | POA: Diagnosis not present

## 2021-11-18 DIAGNOSIS — Z79890 Hormone replacement therapy (postmenopausal): Secondary | ICD-10-CM | POA: Diagnosis not present

## 2021-11-18 DIAGNOSIS — Z888 Allergy status to other drugs, medicaments and biological substances status: Secondary | ICD-10-CM | POA: Diagnosis not present

## 2021-11-18 DIAGNOSIS — Z79899 Other long term (current) drug therapy: Secondary | ICD-10-CM | POA: Diagnosis not present

## 2021-11-18 DIAGNOSIS — K644 Residual hemorrhoidal skin tags: Secondary | ICD-10-CM | POA: Diagnosis present

## 2021-11-18 DIAGNOSIS — Z7902 Long term (current) use of antithrombotics/antiplatelets: Secondary | ICD-10-CM | POA: Diagnosis not present

## 2021-11-18 DIAGNOSIS — D631 Anemia in chronic kidney disease: Secondary | ICD-10-CM | POA: Diagnosis present

## 2021-11-18 DIAGNOSIS — Z886 Allergy status to analgesic agent status: Secondary | ICD-10-CM | POA: Diagnosis not present

## 2021-11-18 DIAGNOSIS — Z9081 Acquired absence of spleen: Secondary | ICD-10-CM | POA: Diagnosis not present

## 2021-11-18 DIAGNOSIS — M069 Rheumatoid arthritis, unspecified: Secondary | ICD-10-CM | POA: Diagnosis present

## 2021-11-18 DIAGNOSIS — N184 Chronic kidney disease, stage 4 (severe): Secondary | ICD-10-CM | POA: Diagnosis present

## 2021-11-18 LAB — HEMOGLOBIN
Hemoglobin: 8.8 g/dL — ABNORMAL LOW (ref 12.0–15.0)
Hemoglobin: 10.3 g/dL — ABNORMAL LOW (ref 12.0–15.0)
Hemoglobin: 9.5 g/dL — ABNORMAL LOW (ref 12.0–15.0)

## 2021-11-18 MED ORDER — TECHNETIUM TC 99M-LABELED RED BLOOD CELLS IV KIT
25.0000 | PACK | Freq: Once | INTRAVENOUS | Status: AC | PRN
  Administered 2021-11-18: 25 via INTRAVENOUS

## 2021-11-18 NOTE — Progress Notes (Addendum)
    Progress Note   Subjective  Day #2 Chief Complaint: GI bleed  Today, patient tells me she continues to bleed quite a lot of bright red blood every 2-3 hours.  Her last movement was around 830.  Hemoglobin has dropped significantly from 11.9-8.8.  Denies abdominal pain or other symptoms.   Objective   Vital signs in last 24 hours: Temp:  [97.5 F (36.4 C)-98.3 F (36.8 C)] 98.2 F (36.8 C) (07/21 0419) Pulse Rate:  [52-72] 63 (07/21 0912) Resp:  [15-29] 20 (07/21 0419) BP: (110-188)/(51-87) 152/66 (07/21 0912) SpO2:  [90 %-100 %] 96 % (07/21 0419) Last BM Date : 11/17/21 General:    Elderly white female in NAD Heart:  Regular rate and rhythm; no murmurs Lungs: Respirations even and unlabored, lungs CTA bilaterally Abdomen:  Soft, nontender and nondistended. Normal bowel sounds. Psych:  Cooperative. Normal mood and affect.  Intake/Output from previous day: 07/20 0701 - 07/21 0700 In: 709 [P.O.:709] Out: -   Lab Results: Recent Labs    11/16/21 2356 11/17/21 0500 11/17/21 1534 11/17/21 2046 11/18/21 0446  WBC 8.9 9.2  --   --   --   HGB 11.9* 11.3* 11.5* 10.9* 8.8*  HCT 35.5* 34.3*  --   --   --   PLT 435* 398  --   --   --    BMET Recent Labs    11/16/21 2356 11/17/21 0500  NA 138 141  K 4.2 3.4*  CL 103 111  CO2 25 21*  GLUCOSE 101* 99  BUN 69* 58*  CREATININE 2.48* 2.09*  CALCIUM 9.8 8.3*   LFT Recent Labs    11/16/21 2356  PROT 6.7  ALBUMIN 3.6  AST 17  ALT 14  ALKPHOS 68  BILITOT 0.7    Assessment / Plan:    Assessment: 1.  GI bleed: Last colonoscopy in 2017 with history of adenomatous polyps, now with at least 10 or more episodes over the past 24 hours, occur every couple of hours of bright red blood filling the toilet, the last around 830 this morning, hemoglobin dropped from 11.9--> 8.8, possible posterior fissure on rectal exam but most likely this is a diverticular bleed 2.  CKD stage IV 3.  Hypertension 4.  Rheumatoid  arthritis 5.  CAD on Plavix: On hold, last dose 7/19 a.m.  Plan: 1.  Ordered stat nuc med bleeding scan for blood loss. 2.  Continue to monitor hemoglobin and transfusion as needed less than 7 3.  Patient to remain on clear liquid diet for now 4.  Continue to hold Plavix  Thank you for kind consultation, we will continue to follow along   LOS: 0 days   Levin Erp  11/18/2021, 10:01 AM    Attending Physician Note   I have taken an interval history, reviewed the chart and examined the patient. I performed a substantive portion of this encounter, including complete performance of at least one of the key components, in conjunction with the APP. I agree with the APP's note, impression and recommendations with my edits. My additional impressions and recommendations are as follows.   Persistent LGI bleeding every 2-3 hours overnight. Presumed diverticular bleed. With CKD would like to avoid IV contrast with CTA. Proceed with Nuc Med bleeding scan.   ABL anemia. Hgb=8.8  CAD continue to hold Plavix  Lucio Edward, MD [facility] See Shea Evans, [facility], for our on call provider

## 2021-11-18 NOTE — Progress Notes (Signed)
  Transition of Care [facility]) Screening Note   Patient Details  Name: [name] Date of Birth: 20-Jun-1940   Transition of Care [facility]) CM/SW Contact:    Vassie Moselle, LCSW Phone Number: 11/18/2021, 1:23 PM    Transition of Care Department [facility]) has reviewed patient and no TOC needs have been identified at this time. We will continue to monitor patient advancement through interdisciplinary progression rounds. If new patient transition needs arise, please place a TOC consult.

## 2021-11-18 NOTE — Plan of Care (Signed)
Pt continues to have bright red, heavy bleeding from rectum this shift. Denies pain. Problem: Education: Goal: Knowledge of General Education information will improve Description: Including pain rating scale, medication(s)/side effects and non-pharmacologic comfort measures Outcome: Progressing   Problem: Health Behavior/Discharge Planning: Goal: Ability to manage health-related needs will improve Outcome: Progressing   Problem: Clinical Measurements: Goal: Ability to maintain clinical measurements within normal limits will improve Outcome: Progressing Goal: Will remain free from infection Outcome: Progressing Goal: Diagnostic test results will improve Outcome: Progressing Goal: Respiratory complications will improve Outcome: Progressing Goal: Cardiovascular complication will be avoided Outcome: Progressing   Problem: Activity: Goal: Risk for activity intolerance will decrease Outcome: Progressing   Problem: Coping: Goal: Level of anxiety will decrease Outcome: Progressing   Problem: Elimination: Goal: Will not experience complications related to urinary retention Outcome: Progressing   Problem: Pain Managment: Goal: General experience of comfort will improve Outcome: Progressing   Problem: Safety: Goal: Ability to remain free from injury will improve Outcome: Progressing   Problem: Skin Integrity: Goal: Risk for impaired skin integrity will decrease Outcome: Progressing

## 2021-11-18 NOTE — Progress Notes (Signed)
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  Subjective / 24h Interval events: Continues to have blood every time she goes to the bathroom.  Multiple episodes overnight.  Assesement and Plan: Principal Problem:   BRBPR (bright red blood per rectum) Active Problems:   CKD (chronic kidney disease) stage 4, GFR 15-29 ml/min (HCC)   Essential hypertension   Rheumatoid arthritis (HCC)  Principal problem Bright red blood per rectum, lower GI bleed-initially there were suspicion for hemorrhoidal bleed however that usually is self-limited.  She continues to have bleeding this morning, and hemoglobin has been trending down from 11 on admission to 8.8 this morning.  GI consulted, appreciate input.  Looks like she will get a tagged RBC scan  Active problems Acute blood loss anemia-due to #1, hemoglobin trending down.  Continue to closely monitor.  No need for transfusion currently but patient is agreeable if needed  CKD 4-baseline creatinine 1.7-2.0, currently at baseline.  Briefly discussed with nephrology, she will need outpatient follow-up  Essential hypertension-continue amlodipine, metoprolol  Hypothyroidism-continue Synthroid  RA-continue Arava, she is also on Biologics as an outpatient  Hyperlipidemia-continue atorvastatin  Scheduled Meds:  amLODipine  2.5 mg Oral Daily   arformoterol  15 mcg Nebulization BID   And   umeclidinium bromide  1 puff Inhalation Daily   atorvastatin  80 mg Oral Daily   docusate sodium  100 mg Oral Daily   famotidine  20 mg Oral QHS   gabapentin   300 mg Oral BID   leflunomide  20 mg Oral Daily   levothyroxine  88 mcg Oral QAC breakfast   metoprolol tartrate  75 mg Oral BID   pantoprazole  40 mg Oral Daily   Continuous Infusions:  sodium chloride 125 mL/hr at 11/18/21 0914   PRN Meds:.acetaminophen **OR** acetaminophen, albuterol, labetalol, ondansetron **OR** ondansetron (ZOFRAN) IV  Diet Orders (From admission, onward)     Start     Ordered   11/17/21 0413  Diet clear liquid Room service appropriate? Yes; Fluid consistency: Thin  Diet effective now       Question Answer Comment  Room service appropriate? Yes   Fluid consistency: Thin      11/17/21 0415            DVT prophylaxis: SCDs Start: 11/17/21 0416   Lab Results  Component Value Date   PLT 398 11/17/2021      Code Status: Full Code  Family Communication: no family at bedside  Status is: Observation The patient will require care spanning > 2 midnights and should be moved to inpatient because: Persistent bleed, worsening acute blood loss anemia   Level of care: Telemetry  Consultants:  GI  Objective: Vitals:   11/17/21 2305 11/18/21 0419 11/18/21 0557 11/18/21 0912  BP: (!) 171/70 (!) 188/60 (!) 168/62 (!) 152/66  Pulse: 72 62  63  Resp: 20 20    Temp: 97.6 F (36.4 C) 98.2 F (36.8 C)    TempSrc: Oral Oral    SpO2: 95% 96%    Weight:      Height:        Intake/Output  Summary (Last 24 hours) at 11/18/2021 1039 Last data filed at 11/18/2021 1018 Gross per 24 hour  Intake 1063 ml  Output --  Net 1063 ml   Wt Readings from Last 3 Encounters:  11/16/21 68 kg  11/14/21 69.3 kg  11/07/21 69.1 kg    Examination:  Constitutional: NAD Eyes: no scleral icterus ENMT: Mucous membranes are moist.  Neck: normal, supple Respiratory: clear to auscultation bilaterally, no wheezing, no crackles. Normal respiratory effort. No accessory muscle use.  Cardiovascular: Regular rate and rhythm, no murmurs / rubs / gallops. No LE edema.  Abdomen:  non distended, no tenderness. Bowel sounds positive.  Musculoskeletal: no clubbing / cyanosis.  Skin: no rashes Neurologic: non focal   Data Reviewed: I have independently reviewed following labs and imaging studies   CBC Recent Labs  Lab 11/16/21 2356 11/17/21 0500 11/17/21 1534 11/17/21 2046 11/18/21 0446  WBC 8.9 9.2  --   --   --   HGB 11.9* 11.3* 11.5* 10.9* 8.8*  HCT 35.5* 34.3*  --   --   --   PLT 435* 398  --   --   --   MCV 96.7 97.7  --   --   --   MCH 32.4 32.2  --   --   --   MCHC 33.5 32.9  --   --   --   RDW 15.5 15.6*  --   --   --     Recent Labs  Lab 11/16/21 2356 11/17/21 0500  NA 138 141  K 4.2 3.4*  CL 103 111  CO2 25 21*  GLUCOSE 101* 99  BUN 69* 58*  CREATININE 2.48* 2.09*  CALCIUM 9.8 8.3*  AST 17  --   ALT 14  --   ALKPHOS 68  --   BILITOT 0.7  --   ALBUMIN 3.6  --     ------------------------------------------------------------------------------------------------------------------ No results for input(s): "CHOL", "HDL", "LDLCALC", "TRIG", "CHOLHDL", "LDLDIRECT" in the last 72 hours.  No results found for: "HGBA1C" ------------------------------------------------------------------------------------------------------------------ No results for input(s): "TSH", "T4TOTAL", "T3FREE", "THYROIDAB" in the last 72 hours.  Invalid input(s): "FREET3"  Cardiac Enzymes No results for input(s): "CKMB", "TROPONINI", "MYOGLOBIN" in the last 168 hours.  Invalid input(s): "CK" ------------------------------------------------------------------------------------------------------------------    Component Value Date/Time   BNP 445 (H) 08/08/2019 1538    CBG: No results for input(s): "GLUCAP" in the last 168 hours.  No results found for this or any previous visit (from the past 240 hour(s)).   Radiology Studies: No results found.   Marzetta Board, MD, PhD [facility]  Between 7 am - 7 pm I am available, please contact me via Amion  (for emergencies) or Securechat (non urgent messages)  Between 7 pm - 7 am I am not available, please contact night coverage MD/APP via Amion

## 2021-11-19 DIAGNOSIS — K625 Hemorrhage of anus and rectum: Secondary | ICD-10-CM | POA: Diagnosis not present

## 2021-11-19 LAB — HEMOGLOBIN
Hemoglobin: 9.4 g/dL — ABNORMAL LOW (ref 12.0–15.0)
Hemoglobin: 9.3 g/dL — ABNORMAL LOW (ref 12.0–15.0)
Hemoglobin: 9.1 g/dL — ABNORMAL LOW (ref 12.0–15.0)

## 2021-11-19 MED ORDER — LABETALOL HCL 5 MG/ML IV SOLN: 10.0000 mg | INTRAVENOUS | Status: DC | PRN

## 2021-11-19 NOTE — Progress Notes (Signed)
    Progress Note   Assessment    Acute LGI bleed, suspected diverticular bleed, resolving. Nuc Med scan was negative ABL anemia, Hbg=9.5 CAD, Plavix on hold CKD IV   Recommendations   Advance to full liquid diet, monitor for recurrent bleeding, trend Hgb Continue to hold Plavix for at least 7 more days We do not plan a repeat colonoscopy at this time   Chief Complaint   No bleeding overnight. Feels well,  Vital signs in last 24 hours: Temp:  [97.6 F (36.4 C)-97.8 F (36.6 C)] 97.8 F (36.6 C) (07/22 0624) Pulse Rate:  [60-78] 64 (07/22 0624) Resp:  [18-20] 18 (07/22 0726) BP: (142-198)/(58-76) 186/58 (07/22 0624) SpO2:  [94 %-98 %] 94 % (07/22 0726) Last BM Date : 11/17/21  General: Alert, well-developed, in NAD Heart:  Regular rate and rhythm; no murmurs Chest: Clear to ascultation bilaterally Abdomen:  Soft, nontender and nondistended. Normal bowel sounds, without guarding, and without rebound.   Extremities:  Without edema. Neurologic:  Alert and  oriented x4; grossly normal neurologically. Psych:  Alert and cooperative. Normal mood and affect.  Intake/Output from previous day: 07/21 0701 - 07/22 0700 In: 3330.8 [P.O.:472; I.V.:2858.8] Out: -  Intake/Output this shift: No intake/output data recorded.  Lab Results: Recent Labs    11/16/21 2356 11/17/21 0500 11/17/21 1534 11/18/21 1538 11/18/21 2102 11/19/21 0610  WBC 8.9 9.2  --   --   --   --   HGB 11.9* 11.3*   < > 10.3* 9.5* 9.4*  HCT 35.5* 34.3*  --   --   --   --   PLT 435* 398  --   --   --   --    < > = values in this interval not displayed.   BMET Recent Labs    11/16/21 2356 11/17/21 0500  NA 138 141  K 4.2 3.4*  CL 103 111  CO2 25 21*  GLUCOSE 101* 99  BUN 69* 58*  CREATININE 2.48* 2.09*  CALCIUM 9.8 8.3*   LFT Recent Labs    11/16/21 2356  PROT 6.7  ALBUMIN 3.6  AST 17  ALT 14  ALKPHOS 68  BILITOT 0.7    Studies/Results: NM GI Blood Loss  Result Date:  11/18/2021 CLINICAL DATA:  GI bleed, bright red blood per rectum. EXAM: NUCLEAR MEDICINE GASTROINTESTINAL BLEEDING SCAN TECHNIQUE: Sequential abdominal images were obtained following intravenous administration of Tc-57mlabeled red blood cells. RADIOPHARMACEUTICALS:  Twenty-five mCi Tc-962mertechnetate in-vitro labeled red cells. COMPARISON:  MRI abdomen and pelvis November 10, 2021 FINDINGS: Normal distribution of radiotracer on 1 hour and 2 hour imaging. No evidence of enteric radio tracer material. IMPRESSION: No scintigraphic evidence of active gastrointestinal hemorrhage. Electronically Signed   By: JeDahlia Bailiff.D.   On: 11/18/2021 15:17      LOS: 1 day   [employer] T. StFuller PlanMD 11/19/2021, 7:39 AM See AMEnid SkeensI, to contact our on call provider

## 2021-11-19 NOTE — Progress Notes (Addendum)
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  Subjective / 24h Interval events: Bleeding appears to have stopped.  She is feeling well, no abdominal pain, no nausea or vomiting  Assesement and Plan: Principal Problem:   BRBPR (bright red blood per rectum) Active Problems:   CKD (chronic kidney disease) stage 4, GFR 15-29 ml/min (HCC)   Essential hypertension   Rheumatoid arthritis (HCC)   Hematochezia   Acute blood loss anemia (ABLA)   Lower GI bleeding  Principal problem Bright red blood per rectum, lower GI bleed-initially there were suspicion for hemorrhoidal bleed however that usually is self-limited.  No further bleeding since 7/21 AM.  Tagged RBC scan unremarkable.  Fortunately her bleeding seems to have stopped and hemoglobin has stabilized.  Appreciate GI follow-up.  For now continue to monitor clinically  Active problems Acute blood loss anemia-due to #1, hemoglobin trending down.  Continue to closely monitor.  No need to transfuse currently  CKD 4-baseline creatinine 1.7-2.0, currently at baseline.  Briefly discussed with nephrology, she will need outpatient follow-up  Essential hypertension-continue amlodipine, metoprolol, blood pressure slightly on the high side, continue to monitor  Hypothyroidism-continue Synthroid  RA-continue Arava, she is also on Biologics as an outpatient  Hyperlipidemia-continue atorvastatin  Scheduled Meds:  amLODipine  2.5 mg Oral Daily   arformoterol  15 mcg Nebulization BID   And   umeclidinium bromide  1 puff Inhalation Daily    atorvastatin  80 mg Oral Daily   docusate sodium  100 mg Oral Daily   famotidine  20 mg Oral QHS   gabapentin  300 mg Oral BID   leflunomide  20 mg Oral Daily   levothyroxine  88 mcg Oral QAC breakfast   metoprolol tartrate  75 mg Oral BID   pantoprazole  40 mg Oral Daily   Continuous Infusions:  sodium chloride 50 mL/hr at 11/19/21 1047   PRN Meds:.acetaminophen **OR** acetaminophen, albuterol, labetalol, ondansetron **OR** ondansetron (ZOFRAN) IV  Diet Orders (From admission, onward)     Start     Ordered   11/19/21 0919  Diet full liquid Room service appropriate? Yes; Fluid consistency: Thin  Diet effective now       Question Answer Comment  Room service appropriate? Yes   Fluid consistency: Thin      11/19/21 0918            DVT prophylaxis: SCDs Start: 11/17/21 0416   Lab Results  Component Value Date   PLT 398 11/17/2021      Code Status: Full Code  Family Communication: no family at bedside  Status is: Inpatient   Level of care: Telemetry  Consultants:  GI  Objective: Vitals:   11/18/21 2238 11/19/21 0624 11/19/21 0726 11/19/21 0920  BP: (!) 158/65 (!) 186/58  (!) 182/63  Pulse: 78 64  64  Resp:  18 18   Temp:  97.8 F (36.6 C)    TempSrc:  Oral    SpO2:  94% 94%   Weight:      Height:        Intake/Output Summary (Last 24 hours) at 11/19/2021 1202 Last  data filed at 11/19/2021 0300 Gross per 24 hour  Intake 2858.83 ml  Output --  Net 2858.83 ml    Wt Readings from Last 3 Encounters:  11/16/21 68 kg  11/14/21 69.3 kg  11/07/21 69.1 kg    Examination:  Constitutional: NAD Eyes: lids and conjunctivae normal, no scleral icterus ENMT: mmm Neck: normal, supple Respiratory: clear to auscultation bilaterally, no wheezing, no crackles. Normal respiratory effort.  Cardiovascular: Regular rate and rhythm, no murmurs / rubs / gallops. No LE edema. Abdomen: soft, no distention, no tenderness. Bowel sounds positive.  Skin: no  rashes Neurologic: no focal deficits, equal strength   Data Reviewed: I have independently reviewed following labs and imaging studies   CBC Recent Labs  Lab 11/16/21 2356 11/17/21 0500 11/17/21 1534 11/17/21 2046 11/18/21 0446 11/18/21 1538 11/18/21 2102 11/19/21 0610  WBC 8.9 9.2  --   --   --   --   --   --   HGB 11.9* 11.3*   < > 10.9* 8.8* 10.3* 9.5* 9.4*  HCT 35.5* 34.3*  --   --   --   --   --   --   PLT 435* 398  --   --   --   --   --   --   MCV 96.7 97.7  --   --   --   --   --   --   MCH 32.4 32.2  --   --   --   --   --   --   MCHC 33.5 32.9  --   --   --   --   --   --   RDW 15.5 15.6*  --   --   --   --   --   --    < > = values in this interval not displayed.     Recent Labs  Lab 11/16/21 2356 11/17/21 0500  NA 138 141  K 4.2 3.4*  CL 103 111  CO2 25 21*  GLUCOSE 101* 99  BUN 69* 58*  CREATININE 2.48* 2.09*  CALCIUM 9.8 8.3*  AST 17  --   ALT 14  --   ALKPHOS 68  --   BILITOT 0.7  --   ALBUMIN 3.6  --      ------------------------------------------------------------------------------------------------------------------ No results for input(s): "CHOL", "HDL", "LDLCALC", "TRIG", "CHOLHDL", "LDLDIRECT" in the last 72 hours.  No results found for: "HGBA1C" ------------------------------------------------------------------------------------------------------------------ No results for input(s): "TSH", "T4TOTAL", "T3FREE", "THYROIDAB" in the last 72 hours.  Invalid input(s): "FREET3"  Cardiac Enzymes No results for input(s): "CKMB", "TROPONINI", "MYOGLOBIN" in the last 168 hours.  Invalid input(s): "CK" ------------------------------------------------------------------------------------------------------------------    Component Value Date/Time   BNP 445 (H) 08/08/2019 1538    CBG: No results for input(s): "GLUCAP" in the last 168 hours.  No results found for this or any previous visit (from the past 240 hour(s)).   Radiology  Studies: NM GI Blood Loss  Result Date: 11/18/2021 CLINICAL DATA:  GI bleed, bright red blood per rectum. EXAM: NUCLEAR MEDICINE GASTROINTESTINAL BLEEDING SCAN TECHNIQUE: Sequential abdominal images were obtained following intravenous administration of Tc-72mlabeled red blood cells. RADIOPHARMACEUTICALS:  Twenty-five mCi Tc-935mertechnetate in-vitro labeled red cells. COMPARISON:  MRI abdomen and pelvis November 10, 2021 FINDINGS: Normal distribution of radiotracer on 1 hour and 2 hour imaging. No evidence of enteric radio tracer material. IMPRESSION: No scintigraphic evidence of active gastrointestinal hemorrhage. Electronically Signed   By: JeDahlia Bailiff  M.D.   On: 11/18/2021 15:17     Marzetta Board, MD, PhD [facility]  Between 7 am - 7 pm I am available, please contact me via Amion (for emergencies) or Securechat (non urgent messages)  Between 7 pm - 7 am I am not available, please contact night coverage MD/APP via Amion

## 2021-11-20 DIAGNOSIS — K625 Hemorrhage of anus and rectum: Secondary | ICD-10-CM | POA: Diagnosis not present

## 2021-11-20 LAB — HEMOGLOBIN: Hemoglobin: 9.6 g/dL — ABNORMAL LOW (ref 12.0–15.0)

## 2021-11-20 NOTE — Progress Notes (Signed)
    Progress Note   Assessment    Acute LGI bleed, suspected diverticular bleed, resolved ABL anemia, Hgb=9.6, stable CAD, Plavix on hold CKD IV   Recommendations   Advance to heart healthy diet Continue to hold Plavix for at least 6 more days OK for discharge today from GI standpoint  GI follow up with Dr. Havery Moros    Chief Complaint   No recurrent bleeding. No GI complaints.   Vital signs in last 24 hours: Temp:  [97.7 F (36.5 C)-98.3 F (36.8 C)] 98 F (36.7 C) (07/23 0551) Pulse Rate:  [61-96] 96 (07/23 0742) Resp:  [14-20] 14 (07/23 0737) BP: (148-221)/(49-91) 155/60 (07/23 0551) SpO2:  [94 %-100 %] 96 % (07/23 0742) Last BM Date : 11/18/21  General: Alert, well-developed, in NAD Heart:  Regular rate and rhythm; no murmurs Chest: Clear to ascultation bilaterally Abdomen:  Soft, nontender and nondistended. Normal bowel sounds, without guarding, and without rebound.   Extremities:  Without edema. Neurologic:  Alert and  oriented x4; grossly normal neurologically. Psych:  Alert and cooperative. Normal mood and affect.  Intake/Output from previous day: 07/22 0701 - 07/23 0700 In: 2239 [P.O.:712; I.V.:1527] Out: -  Intake/Output this shift: No intake/output data recorded.  Lab Results: Recent Labs    11/19/21 1213 11/19/21 2038 11/20/21 0542  HGB 9.3* 9.1* 9.6*    Studies/Results: NM GI Blood Loss  Result Date: 11/18/2021 CLINICAL DATA:  GI bleed, bright red blood per rectum. EXAM: NUCLEAR MEDICINE GASTROINTESTINAL BLEEDING SCAN TECHNIQUE: Sequential abdominal images were obtained following intravenous administration of Tc-16mlabeled red blood cells. RADIOPHARMACEUTICALS:  Twenty-five mCi Tc-937mertechnetate in-vitro labeled red cells. COMPARISON:  MRI abdomen and pelvis November 10, 2021 FINDINGS: Normal distribution of radiotracer on 1 hour and 2 hour imaging. No evidence of enteric radio tracer material. IMPRESSION: No scintigraphic evidence of  active gastrointestinal hemorrhage. Electronically Signed   By: JeDahlia Bailiff.D.   On: 11/18/2021 15:17      LOS: 2 days   MaNorberto Sorenson. StFuller PlanMD 11/20/2021, 9:21 AM See AMEnid SkeensI, to contact our on call provider

## 2021-11-20 NOTE — Discharge Instructions (Signed)
Please continue to hold Plavix for 6 additional days.  You can resume afterwards  Follow with Plotnikov, Sherry Lacks, MD in 5-7 days  Please get a complete blood count and chemistry panel checked by your Primary MD at your next visit, and again as instructed by your Primary MD. Please get your medications reviewed and adjusted by your Primary MD.  Please request your Primary MD to go over all Hospital Tests and Procedure/Radiological results at the follow up, please get all Hospital records sent to your Prim MD by signing hospital release before you go home.  In some cases, there will be blood work, cultures and biopsy results pending at the time of your discharge. Please request that your primary care M.D. goes through all the records of your hospital data and follows up on these results.  If you had Pneumonia of Lung problems at the Hospital: Please get a 2 view Chest X ray done in 6-8 weeks after hospital discharge or sooner if instructed by your Primary MD.  If you have Congestive Heart Failure: Please call your Cardiologist or Primary MD anytime you have any of the following symptoms:  1) 3 pound weight gain in 24 hours or 5 pounds in 1 week  2) shortness of breath, with or without a dry hacking cough  3) swelling in the hands, feet or stomach  4) if you have to sleep on extra pillows at night in order to breathe  Follow cardiac low salt diet and 1.5 lit/day fluid restriction.  If you have diabetes Accuchecks 4 times/day, Once in AM empty stomach and then before each meal. Log in all results and show them to your primary doctor at your next visit. If any glucose reading is under 80 or above 300 call your primary MD immediately.  If you have Seizure/Convulsions/Epilepsy: Please do not drive, operate heavy machinery, participate in activities at heights or participate in high speed sports until you have seen by Primary MD or a Neurologist and advised to do so again. Per [facility] statutes, patients with seizures are not allowed to drive until they have been seizure-free for six months.  Use caution when using heavy equipment or power tools. Avoid working on ladders or at heights. Take showers instead of baths. Ensure the water temperature is not too high on the home water heater. Do not go swimming alone. Do not lock yourself in a room alone (i.e. bathroom). When caring for infants or small children, sit down when holding, feeding, or changing them to minimize risk of injury to the child in the event you have a seizure. Maintain good sleep hygiene. Avoid alcohol.   If you had Gastrointestinal Bleeding: Please ask your Primary MD to check a complete blood count within one week of discharge or at your next visit. Your endoscopic/colonoscopic biopsies that are pending at the time of discharge, will also need to followed by your Primary MD.  Get Medicines reviewed and adjusted. Please take all your medications with you for your next visit with your Primary MD  Please request your Primary MD to go over all hospital tests and procedure/radiological results at the follow up, please ask your Primary MD to get all Hospital records sent to his/her office.  If you experience worsening of your admission symptoms, develop shortness of breath, life threatening emergency, suicidal or homicidal thoughts you must seek medical attention immediately by calling 911 or calling your MD immediately  if symptoms less severe.  You must read  complete instructions/literature along with all the possible adverse reactions/side effects for all the Medicines you take and that have been prescribed to you. Take any new Medicines after you have completely understood and accpet all the possible adverse reactions/side effects.   Do not drive or operate heavy machinery when taking Pain medications.   Do not take more than prescribed Pain, Sleep and Anxiety Medications  Special Instructions: If you have  smoked or chewed Tobacco  in the last 2 yrs please stop smoking, stop any regular Alcohol  and or any Recreational drug use.  Wear Seat belts while driving.  Please note You were cared for by a hospitalist during your hospital stay. If you have any questions about your discharge medications or the care you received while you were in the hospital after you are discharged, you can call the unit and asked to speak with the hospitalist on call if the hospitalist that took care of you is not available. Once you are discharged, your primary care physician will handle any further medical issues. Please note that NO REFILLS for any discharge medications will be authorized once you are discharged, as it is imperative that you return to your primary care physician (or establish a relationship with a primary care physician if you do not have one) for your aftercare needs so that they can reassess your need for medications and monitor your lab values.  You can reach the hospitalist office at phone [phone] or fax [phone]   If you do not have a primary care physician, you can call [phone] for a physician referral.  Activity: As tolerated with Full fall precautions use walker/cane & assistance as needed    Diet: regular  Disposition Home

## 2021-11-20 NOTE — Discharge Summary (Signed)
Physician Discharge Summary  [name] TDV:[id number] DOB: 1940/07/04 DOA: 11/16/2021  PCP: Cassandria Anger, MD  Admit date: 11/16/2021 Discharge date: 11/20/2021  Admitted From: home Disposition:  home  Recommendations for Outpatient Follow-up:  Follow up with PCP in 1-2 weeks Please obtain BMP/CBC in one week Patient was instructed to hold Plavix for 6 additional days per gastroenterology recommendations  Home Health: none Equipment/Devices: none  Discharge Condition: stable CODE STATUS: Full code Diet Orders (From admission, onward)     Start     Ordered   11/20/21 0922  Diet Heart Room service appropriate? Yes; Fluid consistency: Thin  Diet effective now       Question Answer Comment  Room service appropriate? Yes   Fluid consistency: Thin      11/20/21 0921            HPI: Per admitting MD, [name] is a 81 y.o. female with medical history significant of ruptured AAA in 1996.  Ruptured splenic artery aneurysms s/p splenectomy.  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  RA on DMARD + biologic therapy, HTN, CKD, CAD. Pt with h/o bleeding hemorrhoid in past. Pt on chronic plavix. Pt had hard BM earlier yesterday.  After this she had immediate onset of BRBPR which was persistent.  Multiple episodes thus far.  In to ED with ongoing bleeding episodes. Pt denies any abdominal pain.  No CP no SOB.  Hospital Course / Discharge diagnoses: Principal Problem:   BRBPR (bright red blood per rectum) Active Problems:   CKD (chronic kidney disease) stage 4, GFR 15-29 ml/min (HCC)   Essential hypertension   Rheumatoid arthritis (HCC)   Hematochezia   Acute blood loss anemia (ABLA)   Lower GI bleeding   Principal problem Bright red blood per rectum, lower GI bleed-initially there were suspicion for hemorrhoidal bleed however that usually is self-limited.  Her bleed  was somewhat persistent initially, and it is most likely diverticular in nature.  Gastroenterology consulted and followed patient while hospitalized.  She underwent a tagged RBC scan which was negative.  Her bleeding spontaneously stopped, her hemoglobin has remained stable, she was watched in the hospital and without further issues will be discharged home in stable condition.  Her Plavix is being held at the GI recommendations but she can resume it in 6 days.   Active problems Acute blood loss anemia-due to #1, hemoglobin trended down but has remained overall stable.  She did not require blood transfusions.  CKD 4-baseline creatinine 1.7-2.0, currently at baseline.  Briefly discussed with nephrology, she will need outpatient follow-up.  Favor holding the ARB right now until follow-up  Essential hypertension-continue amlodipine, metoprolol, blood pressure slightly on the high side, continue to monitor Hypothyroidism-continue Synthroid RA-continue Arava, she is also on Biologics as an outpatient Hyperlipidemia-continue atorvastatin  Sepsis ruled out   Discharge Instructions   Allergies as of 11/20/2021       Reactions   Aspirin Other (See Comments)   Hurts stomach   Codeine Nausea And Vomiting   Nitroglycerin Other (See Comments)   Heart rate drops        Medication List     STOP taking these medications    clopidogrel 75 MG tablet Commonly known as: PLAVIX   losartan 50 MG tablet Commonly known as: COZAAR       TAKE these medications    acetaminophen 500 MG tablet Commonly  known as: TYLENOL Take 500-1,000 mg by mouth daily as needed (pain).   albuterol 108 (90 Base) MCG/ACT inhaler Commonly known as: VENTOLIN HFA Inhale 2 puffs into the lungs every 4 (four) hours as needed for wheezing or shortness of breath.   amLODipine 2.5 MG tablet Commonly known as: NORVASC Take 1 tablet (2.5 mg total) by mouth daily.   atorvastatin 80 MG tablet Commonly known as:  LIPITOR TAKE 1 TABLET EVERY DAY   B-12 2500 MCG Tabs one po qd What changed:  how much to take how to take this when to take this additional instructions   famotidine 20 MG tablet Commonly known as: PEPCID TAKE 1 TABLET AT BEDTIME   gabapentin 300 MG capsule Commonly known as: NEURONTIN TAKE 1 CAPSULE BY MOUTH THREE TIMES A DAY What changed:  how much to take how to take this when to take this additional instructions   hydrochlorothiazide 12.5 MG tablet Commonly known as: HYDRODIURIL TAKE 1 TABLET BY MOUTH EVERY DAY   leflunomide 20 MG tablet Commonly known as: ARAVA Take 20 mg by mouth daily.   levothyroxine 88 MCG tablet Commonly known as: SYNTHROID Take 1 tablet (88 mcg total) by mouth daily before breakfast.   loperamide 2 MG tablet Commonly known as: IMODIUM A-D Take 2 mg by mouth 4 (four) times daily as needed for diarrhea or loose stools.   metoprolol tartrate 50 MG tablet Commonly known as: LOPRESSOR TAKE 1 AND 1/2 TABLETS TWICE DAILY What changed: See the new instructions.   pantoprazole 40 MG tablet Commonly known as: PROTONIX TAKE 1 TABLET DAILY. TAKE 30 TO 60 MINUTES BEFORE FIRST MEAL OF THE DAY What changed: See the new instructions.   psyllium 58.6 % packet Commonly known as: METAMUCIL Take 1 packet by mouth daily.   [place] Every other month per Dr Jamie Brookes Respimat 2.5-2.5 MCG/ACT Aers Generic drug: Tiotropium Bromide-Olodaterol INHALE 2 PUFFS BY MOUTH INTO THE LUNGS DAILY   triamcinolone 55 MCG/ACT Aero nasal inhaler Commonly known as: NASACORT Place 1 spray into the nose at bedtime.   Vitamin D3 50 MCG (2000 UT) capsule Take 1 capsule (2,000 Units total) by mouth daily.       Consultations: Gastroenterology  Procedures/Studies:  NM GI Blood Loss  Result Date: 11/18/2021 CLINICAL DATA:  GI bleed, bright red blood per rectum. EXAM: NUCLEAR MEDICINE GASTROINTESTINAL BLEEDING SCAN TECHNIQUE: Sequential abdominal  images were obtained following intravenous administration of Tc-23mlabeled red blood cells. RADIOPHARMACEUTICALS:  Twenty-five mCi Tc-986mertechnetate in-vitro labeled red cells. COMPARISON:  MRI abdomen and pelvis November 10, 2021 FINDINGS: Normal distribution of radiotracer on 1 hour and 2 hour imaging. No evidence of enteric radio tracer material. IMPRESSION: No scintigraphic evidence of active gastrointestinal hemorrhage. Electronically Signed   By: JeDahlia Bailiff.D.   On: 11/18/2021 15:17   MR ANGIO PELVIS W WO CONTRAST  Result Date: 11/11/2021 CLINICAL DATA:  History of abdominal aortic and splenic artery aneurysms EXAM: MRA ABDOMEN AND PELVIS WITH CONTRAST TECHNIQUE: Multiplanar, multiecho pulse sequences of the abdomen and pelvis were obtained with intravenous contrast. Angiographic images of abdomen and pelvis were obtained using MRA technique with intravenous contrast. CONTRAST:  38m52mADAVIST GADOBUTROL 1 MMOL/ML IV SOLN COMPARISON:  Prior MRA abdomen and pelvis 08/27/2019 FINDINGS: MRA ABDOMEN FINDINGS Aorta: Normal caliber aorta without evidence of aneurysm or dissection. No focal stenosis. Heterogeneous atherosclerotic plaque visualized along the infrarenal abdominal aorta. 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 Tandem secondary focus of ectasias is stable at 8 mm. Ectasia of the proximal common hepatic artery stable at 6 mm. Splenic artery is ligated. 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 The vessel remains patent distally. No aneurysm. Right Renal artery: Solitary right renal artery. Subtle beading of the inferior division is again evident and may reflect underlying fibromuscular dysplasia. Small accessory artery to the right upper pole not visible on today's examination. Left renal artery. Solitary left renal artery.  Patent and without evidence of aneurysm. IMA: Patent. Inflow and proximal outflow vessels: Patent without evidence of focal stenosis, fibromuscular dysplasia, aneurysm or dissection. Non Vascular Findings: Multiple T2 hyperintense renal lesions bilaterally consistent with simple cysts. Otherwise, no focal signal abnormality within the visualized abdominal structures. The spleen is surgically absent. Multilevel degenerative disc disease with dextroconvex lumbar scoliosis. MRI PELVIS FINDINGS Unremarkable uterus and adnexa. No ascites or abnormal fluid. Extensive sigmoid colonic diverticulosis. IMPRESSION: 1. Overall, no interval change in the MRA appearance of the abdomen and pelvis. 2. 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 3. 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 4. Surgical changes of prior splenectomy. No evidence of recurrent splenic artery aneurysm. 5. 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 6.  Aortic Atherosclerosis (ICD10-I70.0). 7. Additional ancillary findings as above without significant interval change. Signed, Criselda Peaches, MD, [facility] Radiology Electronically Signed   By: Jacqulynn Cadet M.D.   On: 11/11/2021 16:43   MR ANGIO ABDOMEN W WO CONTRAST  Result Date: 11/11/2021 CLINICAL DATA:  History of abdominal aortic and splenic artery aneurysms EXAM: MRA ABDOMEN AND PELVIS WITH CONTRAST TECHNIQUE: Multiplanar, multiecho pulse sequences of the abdomen and pelvis were obtained with intravenous contrast. Angiographic images of abdomen and pelvis were obtained using MRA technique with intravenous contrast. CONTRAST:  70m GADAVIST GADOBUTROL 1 MMOL/ML IV SOLN COMPARISON:  Prior MRA abdomen and pelvis 08/27/2019 FINDINGS: MRA ABDOMEN FINDINGS Aorta: Normal caliber aorta without evidence of  aneurysm or dissection. No focal stenosis. Heterogeneous atherosclerotic plaque visualized along the infrarenal abdominal aorta. Celiac axis: High-grade stenosis versus short segment occlusion of the proximal celiac axis. 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 Tandem secondary focus of ectasias is stable at 8 mm. Ectasia of the proximal common hepatic artery stable at 6 mm. Splenic artery is ligated. 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 The vessel remains patent distally. No aneurysm. Right Renal artery: Solitary right renal artery. Subtle beading of the inferior division is again evident and may reflect underlying fibromuscular dysplasia. Small accessory artery to the right upper pole not visible on today's examination. Left renal artery. Solitary left renal artery. Patent and without evidence of aneurysm. IMA: Patent. Inflow and proximal outflow vessels: Patent without evidence of focal stenosis, fibromuscular dysplasia, aneurysm or dissection. Non Vascular Findings: Multiple T2 hyperintense renal lesions bilaterally consistent with simple cysts. Otherwise, no focal signal abnormality within the visualized abdominal structures. The spleen is surgically absent. Multilevel degenerative disc disease with dextroconvex lumbar scoliosis. MRI PELVIS FINDINGS Unremarkable uterus and adnexa. No ascites or abnormal fluid. Extensive sigmoid colonic diverticulosis. IMPRESSION: 1. Overall, no interval change in the MRA appearance of the abdomen and pelvis. 2.  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 3. 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 4. Surgical changes of prior splenectomy. No evidence of recurrent splenic artery aneurysm. 5. 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 6.  Aortic Atherosclerosis  (ICD10-I70.0). 7. Additional ancillary findings as above without significant interval change. Signed, Criselda Peaches, MD, [place] [facility] Radiology Electronically Signed   By: Jacqulynn Cadet M.D.   On: 11/11/2021 16:43     Subjective: - no chest pain, shortness of breath, no abdominal pain, nausea or vomiting.   Discharge Exam: BP (!) 155/60 (BP Location: Right Arm)   Pulse 96   Temp 98 F (36.7 C) (Oral)   Resp 14   Ht '5\' 3"'$  (1.6 m)   Wt 68 kg   SpO2 96%   BMI 26.57 kg/m   General: Pt is alert, awake, not in acute distress Cardiovascular: RRR, S1/S2 +, no rubs, no gallops Respiratory: CTA bilaterally, no wheezing, no rhonchi Abdominal: Soft, NT, ND, bowel sounds + Extremities: no edema, no cyanosis  The results of significant diagnostics from this hospitalization (including imaging, microbiology, ancillary and laboratory) are listed below for reference.     Microbiology: No results found for this or any previous visit (from the past 240 hour(s)).   Labs: Basic Metabolic Panel: Recent Labs  Lab 11/16/21 2356 11/17/21 0500  NA 138 141  K 4.2 3.4*  CL 103 111  CO2 25 21*  GLUCOSE 101* 99  BUN 69* 58*  CREATININE 2.48* 2.09*  CALCIUM 9.8 8.3*   Liver Function Tests: Recent Labs  Lab 11/16/21 2356  AST 17  ALT 14  ALKPHOS 68  BILITOT 0.7  PROT 6.7  ALBUMIN 3.6   CBC: Recent Labs  Lab 11/16/21 2356 11/17/21 0500 11/17/21 1534 11/18/21 2102 11/19/21 0610 11/19/21 1213 11/19/21 2038 11/20/21 0542  WBC 8.9 9.2  --   --   --   --   --   --   HGB 11.9* 11.3*   < > 9.5* 9.4* 9.3* 9.1* 9.6*  HCT 35.5* 34.3*  --   --   --   --   --   --   MCV 96.7 97.7  --   --   --   --   --   --   PLT 435* 398  --   --   --   --   --   --    < > = values in this interval not displayed.   CBG: No results for input(s): "GLUCAP" in the last 168 hours. Hgb A1c No results for input(s): "HGBA1C" in the last 72  hours. Lipid Profile No results for input(s): "CHOL", "HDL", "LDLCALC", "TRIG", "CHOLHDL", "LDLDIRECT" in the last 72 hours. Thyroid function studies No results for input(s): "TSH", "T4TOTAL", "T3FREE", "THYROIDAB" in the last 72 hours.  Invalid input(s): "FREET3" Urinalysis    Component Value Date/Time   COLORURINE YELLOW 11/14/2018 [place] 11/14/2018 1508   LABSPEC 1.010 11/14/2018 1508   PHURINE 6.0 11/14/2018 1508   [place] 11/14/2018 1508   HGBUR NEGATIVE 11/14/2018 1508   BILIRUBINUR NEGATIVE 11/14/2018 [place] 11/14/2018 1508   PROTEINUR NEGATIVE 04/15/2016 1709   UROBILINOGEN 0.2 11/14/2018 1508   NITRITE NEGATIVE 11/14/2018 1508   LEUKOCYTESUR NEGATIVE 11/14/2018 1508  FURTHER DISCHARGE INSTRUCTIONS:   Get Medicines reviewed and adjusted: Please take all your medications with you for your next visit with your Primary MD   Laboratory/radiological data: Please request your Primary MD to go over all hospital tests and procedure/radiological results at the follow up, please ask your Primary MD to get all Hospital records sent to his/her office.   In some cases, they will be blood work, cultures and biopsy results pending at the time of your discharge. Please request that your primary care M.D. goes through all the records of your hospital data and follows up on these results.   Also Note the following: If you experience worsening of your admission symptoms, develop shortness of breath, life threatening emergency, suicidal or homicidal thoughts you must seek medical attention immediately by calling 911 or calling your MD immediately  if symptoms less severe.   You must read complete instructions/literature along with all the possible adverse reactions/side effects for all the Medicines you take and that have been prescribed to you. Take any new Medicines after you have completely understood and accpet all the possible adverse  reactions/side effects.    Do not drive when taking Pain medications or sleeping medications (Benzodaizepines)   Do not take more than prescribed Pain, Sleep and Anxiety Medications. It is not advisable to combine anxiety,sleep and pain medications without talking with your primary care practitioner   Special Instructions: If you have smoked or chewed Tobacco  in the last 2 yrs please stop smoking, stop any regular Alcohol  and or any Recreational drug use.   Wear Seat belts while driving.   Please note: You were cared for by a hospitalist during your hospital stay. Once you are discharged, your primary care physician will handle any further medical issues. Please note that NO REFILLS for any discharge medications will be authorized once you are discharged, as it is imperative that you return to your primary care physician (or establish a relationship with a primary care physician if you do not have one) for your post hospital discharge needs so that they can reassess your need for medications and monitor your lab values.  Time coordinating discharge: 35 minutes  SIGNED:  Marzetta Board, MD, PhD 11/20/2021, 12:48 PM

## 2021-11-21 ENCOUNTER — Telehealth: Payer: Self-pay

## 2021-11-21 NOTE — Telephone Encounter (Signed)
Transition Care Management Unsuccessful Follow-up Telephone Call  Date of discharge and from where:  [facility] on 11/20/2021  Attempts:  1st Attempt  Reason for unsuccessful TCM follow-up call:  No answer/busy

## 2021-11-23 ENCOUNTER — Telehealth: Payer: Self-pay

## 2021-11-23 ENCOUNTER — Other Ambulatory Visit: Payer: Self-pay | Admitting: *Deleted

## 2021-11-23 NOTE — Patient Outreach (Signed)
  Care Coordination [facility] Note Transition Care Management Follow-up Telephone Call Date of discharge and from where: 11/20/21 [facility] How have you been since you were released from the hospital? Patient states she continues to feel weak but she is doing ok. Any questions or concerns? Yes, patient states she was instructed to get labs in one week but her f/u appointment is not until 12/12/21. This is concerning to the patient. Nurse will call PCP office to see if labs can be drawn.  Nurse called PCP's office and spoke to [place] who states she will inform Dr. Judeen Hammans nurse regarding the patient's concern.  Items Reviewed: Did the pt receive and understand the discharge instructions provided? Yes  Medications obtained and verified? Yes  Other? No  Any new allergies since your discharge? No  Dietary orders reviewed? Yes Do you have support at home? Yes   Home Care and Equipment/Supplies: Were home health services ordered? no If so, what is the name of the agency? N/A  Has the agency set up a time to come to the patient's home? not applicable Were any new equipment or medical supplies ordered?  No What is the name of the medical supply agency? N/A Were you able to get the supplies/equipment? not applicable Do you have any questions related to the use of the equipment or supplies? No  Functional Questionnaire: (I = Independent and D = Dependent) ADLs: I  Bathing/Dressing- I  Meal Prep- I  Eating- I  Maintaining continence- I  Transferring/Ambulation- I  Managing Meds- I  Follow up appointments reviewed:  PCP Hospital f/u appt confirmed? Yes  Scheduled to see Dr. Alain Marion on 12/12/21 @ 0910. [facility] f/u appt confirmed? No   Are transportation arrangements needed? No  If their condition worsens, is the pt aware to call PCP or go to the Emergency Dept.? Yes Was the patient provided with contact information for the PCP's office or ED? Yes Was to pt  encouraged to call back with questions or concerns? Yes  SDOH assessments and interventions completed:   Yes  Care Coordination Interventions Activated:  No Care Coordination Interventions:   N/A  Encounter Outcome:  Pt. Visit Completed  Emelia Loron RN, BSN [place] [phone] [email or web address]

## 2021-11-23 NOTE — Telephone Encounter (Signed)
Called Sharee Pimple no answer [facility]  Dr. Alain Marion is out of the office this week, but we will forward your msg to his desktop for her to address when he return back on Monday../l,mb

## 2021-11-23 NOTE — Telephone Encounter (Signed)
Sharee Pimple just spoke with pt. Pt has an appt on 8/14 with dr. Alain Marion. I had scheduled an earlier appt but pt called back to reschedule the appt due to a conflict with her husband appt.   It was recommended that pt get a CBC and BMP in one week. Sharee Pimple is calling asking if another provider can order those labs or if she will be ok to wait until her 8/14 appt to get that drawn.  Please advise  Sharee Pimple is asking for a call back to let the pt know [phone]

## 2021-11-25 DIAGNOSIS — M79672 Pain in left foot: Secondary | ICD-10-CM | POA: Diagnosis not present

## 2021-11-25 DIAGNOSIS — B351 Tinea unguium: Secondary | ICD-10-CM | POA: Diagnosis not present

## 2021-11-25 DIAGNOSIS — M79671 Pain in right foot: Secondary | ICD-10-CM | POA: Diagnosis not present

## 2021-11-25 DIAGNOSIS — L84 Corns and callosities: Secondary | ICD-10-CM | POA: Diagnosis not present

## 2021-11-29 ENCOUNTER — Telehealth: Payer: Self-pay | Admitting: Internal Medicine

## 2021-11-29 DIAGNOSIS — N183 Chronic kidney disease, stage 3 unspecified: Secondary | ICD-10-CM

## 2021-11-29 DIAGNOSIS — D62 Acute posthemorrhagic anemia: Secondary | ICD-10-CM

## 2021-11-29 NOTE — Telephone Encounter (Signed)
Patients husband would like a hemoglobin test to be ordered for this week. Patient has an appointment with Dr. Camila Li on  12/12/2021. Please call -patients husband and let him know when he can take her for labs.

## 2021-12-04 NOTE — Telephone Encounter (Signed)
Okay.  Labs were ordered.  Thanks

## 2021-12-05 NOTE — Telephone Encounter (Signed)
Notified pt MD put order in for A1C.Marland KitchenJohny [name]

## 2021-12-06 ENCOUNTER — Other Ambulatory Visit (INDEPENDENT_AMBULATORY_CARE_PROVIDER_SITE_OTHER): Payer: Medicare PPO

## 2021-12-06 DIAGNOSIS — N183 Chronic kidney disease, stage 3 unspecified: Secondary | ICD-10-CM

## 2021-12-06 DIAGNOSIS — D62 Acute posthemorrhagic anemia: Secondary | ICD-10-CM | POA: Diagnosis not present

## 2021-12-06 LAB — COMPREHENSIVE METABOLIC PANEL
ALT: 10 U/L (ref 0–35)
AST: 17 U/L (ref 0–37)
Albumin: 3.9 g/dL (ref 3.5–5.2)
Alkaline Phosphatase: 70 U/L (ref 39–117)
BUN: 53 mg/dL — ABNORMAL HIGH (ref 6–23)
CO2: 26 mEq/L (ref 19–32)
Calcium: 10.4 mg/dL (ref 8.4–10.5)
Chloride: 101 mEq/L (ref 96–112)
Creatinine, Ser: 2.51 mg/dL — ABNORMAL HIGH (ref 0.40–1.20)
GFR: 17.28 mL/min — ABNORMAL LOW (ref >60.00)
Glucose, Bld: 105 mg/dL — ABNORMAL HIGH (ref 70–99)
Potassium: 4.5 mEq/L (ref 3.5–5.1)
Sodium: 139 mEq/L (ref 135–145)
Total Bilirubin: 0.6 mg/dL (ref 0.2–1.2)
Total Protein: 6.7 g/dL (ref 6.0–8.3)

## 2021-12-06 LAB — CBC WITH DIFFERENTIAL/PLATELET
Basophils Absolute: 0.3 10*3/uL — ABNORMAL HIGH (ref 0.0–0.1)
Basophils Relative: 3.6 % — ABNORMAL HIGH (ref 0.0–3.0)
Eosinophils Absolute: 0.6 10*3/uL (ref 0.0–0.7)
Eosinophils Relative: 7.1 % — ABNORMAL HIGH (ref 0.0–5.0)
HCT: 32.6 % — ABNORMAL LOW (ref 36.0–46.0)
Hemoglobin: 11 g/dL — ABNORMAL LOW (ref 12.0–15.0)
Lymphocytes Relative: 32 % (ref 12.0–46.0)
Lymphs Abs: 2.7 10*3/uL (ref 0.7–4.0)
MCHC: 33.6 g/dL (ref 30.0–36.0)
MCV: 97.3 fl (ref 78.0–100.0)
Monocytes Absolute: 1.2 10*3/uL — ABNORMAL HIGH (ref 0.1–1.0)
Monocytes Relative: 14.5 % — ABNORMAL HIGH (ref 3.0–12.0)
Neutro Abs: 3.6 10*3/uL (ref 1.4–7.7)
Neutrophils Relative %: 42.8 % — ABNORMAL LOW (ref 43.0–77.0)
Platelets: 314 10*3/uL (ref 150.0–400.0)
RBC: 3.35 Mil/uL — ABNORMAL LOW (ref 3.87–5.11)
RDW: 15.4 % (ref 11.5–15.5)
WBC: 8.5 10*3/uL (ref 4.0–10.5)

## 2021-12-07 LAB — IRON,TIBC AND FERRITIN PANEL
%SAT: 32 % (calc) (ref 16–45)
Ferritin: 86 ng/mL (ref 16–288)
Iron: 99 ug/dL (ref 45–160)
TIBC: 310 mcg/dL (calc) (ref 250–450)

## 2021-12-08 ENCOUNTER — Ambulatory Visit: Payer: Medicare PPO | Admitting: Internal Medicine

## 2021-12-10 ENCOUNTER — Encounter: Payer: Self-pay | Admitting: Internal Medicine

## 2021-12-10 ENCOUNTER — Other Ambulatory Visit: Payer: Self-pay | Admitting: Internal Medicine

## 2021-12-12 ENCOUNTER — Encounter: Payer: Self-pay | Admitting: Internal Medicine

## 2021-12-12 ENCOUNTER — Ambulatory Visit: Payer: Medicare PPO | Admitting: Internal Medicine

## 2021-12-12 VITALS — BP 118/60 | HR 69 | Temp 97.7°F | Ht 63.0 in | Wt 153.0 lb

## 2021-12-12 DIAGNOSIS — N184 Chronic kidney disease, stage 4 (severe): Secondary | ICD-10-CM | POA: Diagnosis not present

## 2021-12-12 DIAGNOSIS — M069 Rheumatoid arthritis, unspecified: Secondary | ICD-10-CM | POA: Diagnosis not present

## 2021-12-12 DIAGNOSIS — D649 Anemia, unspecified: Secondary | ICD-10-CM

## 2021-12-12 DIAGNOSIS — K551 Chronic vascular disorders of intestine: Secondary | ICD-10-CM

## 2021-12-12 DIAGNOSIS — G459 Transient cerebral ischemic attack, unspecified: Secondary | ICD-10-CM | POA: Diagnosis not present

## 2021-12-12 MED ORDER — METOPROLOL TARTRATE 50 MG PO TABS: 75.0000 mg | ORAL_TABLET | Freq: Two times a day (BID) | ORAL | 3 refills | Status: DC

## 2021-12-12 NOTE — Assessment & Plan Note (Signed)
D/c Losartan, Cont on Amlodipine, Metoprolol, HCTZ, Lipitor, Plavix

## 2021-12-12 NOTE — Progress Notes (Signed)
Subjective:  Patient ID: [name], female    DOB: December 27, 1940  Age: 81 y.o. MRN: [id number]  CC: No chief complaint on file.   HPI [name] presents for recent GI bleed C/o weakness, DOE, anemia  Per hx:  "Admit date: 11/16/2021 Discharge date: 11/20/2021   Admitted From: home Disposition:  home   Recommendations for Outpatient Follow-up:  Follow up with PCP in 1-2 weeks Please obtain BMP/CBC in one week Patient was instructed to hold Plavix for 6 additional days per gastroenterology recommendations   Home Health: none Equipment/Devices: none   Discharge Condition: stable CODE STATUS: Full code Diet Orders (From admission, onward)        Start     Ordered    11/20/21 0922   Diet Heart Room service appropriate? Yes; Fluid consistency: Thin  Diet effective now       Question Answer Comment  Room service appropriate? Yes    Fluid consistency: Thin       11/20/21 0921                  HPI: Per admitting MD, [name] is a 81 y.o. female with medical history significant of ruptured AAA in 1996.  Ruptured splenic artery aneurysms s/p splenectomy.  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  RA on DMARD + biologic therapy, HTN, CKD, CAD. Pt with h/o bleeding hemorrhoid in past. Pt on chronic plavix. Pt had hard BM earlier yesterday.  After this she had immediate onset of BRBPR which was persistent.  Multiple episodes thus far.  In to ED with ongoing bleeding episodes. Pt denies any abdominal pain.  No CP no SOB.   Hospital Course / Discharge diagnoses: Principal Problem:   BRBPR (bright red blood per rectum) Active Problems:   CKD (chronic kidney disease) stage 4, GFR 15-29 ml/min (HCC)   Essential hypertension   Rheumatoid arthritis (HCC)   Hematochezia   Acute blood loss anemia (ABLA)   Lower GI bleeding     Principal problem Bright red blood per  rectum, lower GI bleed-initially there were suspicion for hemorrhoidal bleed however that usually is self-limited.  Her bleed was somewhat persistent initially, and it is most likely diverticular in nature.  Gastroenterology consulted and followed patient while hospitalized.  She underwent a tagged RBC scan which was negative.  Her bleeding spontaneously stopped, her hemoglobin has remained stable, she was watched in the hospital and without further issues will be discharged home in stable condition.  Her Plavix is being held at the GI recommendations but she can resume it in 6 days.   Active problems Acute blood loss anemia-due to #1, hemoglobin trended down but has remained overall stable.  She did not require blood transfusions.  CKD 4-baseline creatinine 1.7-2.0, currently at baseline.  Briefly discussed with nephrology, she will need outpatient follow-up.  Favor holding the ARB right now until follow-up  Essential hypertension-continue amlodipine, metoprolol, blood pressure slightly on the high side, continue to monitor Hypothyroidism-continue Synthroid RA-continue Arava, she is also on Biologics as an outpatient Hyperlipidemia-continue atorvastatin   Sepsis ruled out"     Outpatient Medications Prior to Visit  Medication Sig Dispense Refill   acetaminophen (TYLENOL) 500 MG tablet Take 500-1,000 mg by mouth daily as needed (pain).     albuterol (VENTOLIN HFA) 108 (90 Base) MCG/ACT inhaler Inhale 2 puffs into the lungs every 4 (four) hours  as needed for wheezing or shortness of breath. 8 g 2   amLODipine (NORVASC) 2.5 MG tablet Take 1 tablet (2.5 mg total) by mouth daily. 90 tablet 2   atorvastatin (LIPITOR) 80 MG tablet TAKE 1 TABLET EVERY DAY (Patient taking differently: Take 80 mg by mouth daily.) 90 tablet 2   Cholecalciferol (VITAMIN D3) 50 MCG (2000 UT) capsule Take 1 capsule (2,000 Units total) by mouth daily. 100 capsule 3   Cyanocobalamin (B-12) 2500 MCG TABS one po qd (Patient  taking differently: Take 2,500 mcg by mouth daily.) 100 tablet 3   famotidine (PEPCID) 20 MG tablet TAKE 1 TABLET AT BEDTIME (Patient taking differently: Take 20 mg by mouth at bedtime.) 90 tablet 3   gabapentin (NEURONTIN) 300 MG capsule TAKE 1 CAPSULE BY MOUTH THREE TIMES A DAY (Patient taking differently: Take 300 mg by mouth 2 (two) times daily.) 270 capsule 1   Golimumab (SIMPONI [place]) Every other month per Dr Trudie Reed     hydrochlorothiazide (HYDRODIURIL) 12.5 MG tablet TAKE 1 TABLET BY MOUTH EVERY DAY (Patient taking differently: Take 12.5 mg by mouth daily.) 90 tablet 3   leflunomide (ARAVA) 20 MG tablet Take 20 mg by mouth daily.      levothyroxine (SYNTHROID) 88 MCG tablet Take 1 tablet (88 mcg total) by mouth daily before breakfast. 90 tablet 3   loperamide (IMODIUM A-D) 2 MG tablet Take 2 mg by mouth 4 (four) times daily as needed for diarrhea or loose stools.     metoprolol tartrate (LOPRESSOR) 50 MG tablet TAKE 1 AND 1/2 TABLETS TWICE DAILY (Patient taking differently: Take 75 mg by mouth 2 (two) times daily.) 270 tablet 3   pantoprazole (PROTONIX) 40 MG tablet TAKE 1 TABLET DAILY. TAKE 30 TO 60 MINUTES BEFORE FIRST MEAL OF THE DAY (Patient taking differently: Take 40 mg by mouth daily.) 90 tablet 3   psyllium (METAMUCIL) 58.6 % packet Take 1 packet by mouth daily.     Tiotropium Bromide-Olodaterol (STIOLTO RESPIMAT) 2.5-2.5 MCG/ACT AERS INHALE 2 PUFFS BY MOUTH INTO THE LUNGS DAILY (Patient taking differently: Inhale 2 puffs by mouth into the lungs daily) 4 g 11   triamcinolone (NASACORT) 55 MCG/ACT AERO nasal inhaler Place 1 spray into the nose at bedtime.     No facility-administered medications prior to visit.    ROS: Review of Systems  Constitutional:  Positive for fatigue. Negative for activity change, appetite change, chills and unexpected weight change.  HENT:  Negative for congestion, mouth sores and sinus pressure.   Eyes:  Negative for visual disturbance.  Respiratory:   Negative for cough and chest tightness.   Gastrointestinal:  Negative for abdominal pain and nausea.  Genitourinary:  Negative for difficulty urinating, frequency and vaginal pain.  Musculoskeletal:  Positive for gait problem. Negative for back pain.  Skin:  Negative for pallor and rash.  Neurological:  Positive for weakness. Negative for dizziness, tremors, numbness and headaches.  Psychiatric/Behavioral:  Negative for confusion and sleep disturbance.     Objective:  BP 118/60 (BP Location: Left Arm, Patient Position: Sitting, Cuff Size: Normal)   Pulse 69   Temp 97.7 F (36.5 C) (Oral)   Ht '5\' 3"'$  (1.6 m)   Wt 153 lb (69.4 kg)   SpO2 96%   BMI 27.10 kg/m   BP Readings from Last 3 Encounters:  12/12/21 118/60  11/20/21 (!) 142/78  11/14/21 (!) 148/82    Wt Readings from Last 3 Encounters:  12/12/21 153 lb (69.4 kg)  11/16/21  150 lb (68 kg)  11/14/21 152 lb 12.8 oz (69.3 kg)    Physical Exam Constitutional:      General: She is not in acute distress.    Appearance: She is well-developed.  HENT:     Head: Normocephalic.     Right Ear: External ear normal.     Left Ear: External ear normal.     Nose: Nose normal.  Eyes:     General:        Right eye: No discharge.        Left eye: No discharge.     Conjunctiva/sclera: Conjunctivae normal.     Pupils: Pupils are equal, round, and reactive to light.  Neck:     Thyroid: No thyromegaly.     Vascular: No JVD.     Trachea: No tracheal deviation.  Cardiovascular:     Rate and Rhythm: Normal rate and regular rhythm.     Heart sounds: Normal heart sounds.  Pulmonary:     Effort: No respiratory distress.     Breath sounds: No stridor. No wheezing.  Abdominal:     General: Bowel sounds are normal. There is no distension.     Palpations: Abdomen is soft. There is no mass.     Tenderness: There is no abdominal tenderness. There is no guarding or rebound.  Musculoskeletal:        General: No tenderness.     Cervical  back: Normal range of motion and neck supple. No rigidity.  Lymphadenopathy:     Cervical: No cervical adenopathy.  Skin:    Findings: No erythema or rash.  Neurological:     Cranial Nerves: No cranial nerve deficit.     Motor: Weakness present. No abnormal muscle tone.     Coordination: Coordination normal.     Gait: Gait abnormal.     Deep Tendon Reflexes: Reflexes normal.  Psychiatric:        Behavior: Behavior normal.        Thought Content: Thought content normal.        Judgment: Judgment normal.   Using a walker Painful joints Using a walker Pain in the hips   Lab Results  Component Value Date   WBC 8.5 12/06/2021   HGB 11.0 (L) 12/06/2021   HCT 32.6 (L) 12/06/2021   PLT 314.0 12/06/2021   GLUCOSE 105 (H) 12/06/2021   CHOL 157 09/07/2020   TRIG 121.0 09/07/2020   HDL 59.30 09/07/2020   LDLDIRECT 162.1 10/28/2009   LDLCALC 74 09/07/2020   ALT 10 12/06/2021   AST 17 12/06/2021   NA 139 12/06/2021   K 4.5 12/06/2021   CL 101 12/06/2021   CREATININE 2.51 (H) 12/06/2021   BUN 53 (H) 12/06/2021   CO2 26 12/06/2021   TSH 2.35 06/21/2021   INR 0.89 05/20/2018    No results found.  Assessment & Plan:   Problem List Items Addressed This Visit     CKD (chronic kidney disease) stage 4, GFR 15-29 ml/min (HCC)    D/c Losartan Labs in 1 mo      Relevant Orders   CBC with Differential/Platelet   Comprehensive metabolic panel   Mesenteric artery stenosis (HCC) (Chronic)    Cont on Plavix      Rheumatoid arthritis (HCC) (Chronic)    On Arava Hip surgery is pending/delayed due to GI bleed-  F/u Dr Erlinda Hong      Relevant Orders   CBC with Differential/Platelet   Comprehensive metabolic panel  TIA (transient ischemic attack)    D/c Losartan, Cont on Amlodipine, Metoprolol, HCTZ, Lipitor, Plavix      Other Visit Diagnoses     Anemia, unspecified type    -  Primary   Relevant Orders   CBC with Differential/Platelet   Comprehensive metabolic panel          No orders of the defined types were placed in this encounter.     Follow-up: Return in about 4 weeks (around 01/09/2022) for a follow-up visit.  [name], MD

## 2021-12-12 NOTE — Assessment & Plan Note (Addendum)
On Arava Hip surgery is pending/delayed due to GI bleed-  F/u Dr Erlinda Hong

## 2021-12-12 NOTE — Assessment & Plan Note (Signed)
Cont on Plavix

## 2021-12-12 NOTE — Assessment & Plan Note (Signed)
D/c Losartan Labs in 1 mo

## 2021-12-14 ENCOUNTER — Other Ambulatory Visit: Payer: Self-pay

## 2021-12-22 ENCOUNTER — Ambulatory Visit: Payer: Medicare PPO | Admitting: Internal Medicine

## 2021-12-23 DIAGNOSIS — I2510 Atherosclerotic heart disease of native coronary artery without angina pectoris: Secondary | ICD-10-CM | POA: Diagnosis not present

## 2021-12-23 DIAGNOSIS — N184 Chronic kidney disease, stage 4 (severe): Secondary | ICD-10-CM | POA: Diagnosis not present

## 2021-12-23 DIAGNOSIS — I129 Hypertensive chronic kidney disease with stage 1 through stage 4 chronic kidney disease, or unspecified chronic kidney disease: Secondary | ICD-10-CM | POA: Diagnosis not present

## 2021-12-23 DIAGNOSIS — K922 Gastrointestinal hemorrhage, unspecified: Secondary | ICD-10-CM | POA: Diagnosis not present

## 2021-12-23 DIAGNOSIS — Z9889 Other specified postprocedural states: Secondary | ICD-10-CM | POA: Diagnosis not present

## 2021-12-26 DIAGNOSIS — N184 Chronic kidney disease, stage 4 (severe): Secondary | ICD-10-CM | POA: Diagnosis not present

## 2021-12-28 DIAGNOSIS — M0579 Rheumatoid arthritis with rheumatoid factor of multiple sites without organ or systems involvement: Secondary | ICD-10-CM | POA: Diagnosis not present

## 2021-12-28 DIAGNOSIS — Z79899 Other long term (current) drug therapy: Secondary | ICD-10-CM | POA: Diagnosis not present

## 2022-01-03 ENCOUNTER — Other Ambulatory Visit (INDEPENDENT_AMBULATORY_CARE_PROVIDER_SITE_OTHER): Payer: Medicare PPO

## 2022-01-03 DIAGNOSIS — M069 Rheumatoid arthritis, unspecified: Secondary | ICD-10-CM | POA: Diagnosis not present

## 2022-01-03 DIAGNOSIS — N184 Chronic kidney disease, stage 4 (severe): Secondary | ICD-10-CM | POA: Diagnosis not present

## 2022-01-03 DIAGNOSIS — D649 Anemia, unspecified: Secondary | ICD-10-CM

## 2022-01-03 LAB — COMPREHENSIVE METABOLIC PANEL
ALT: 12 U/L (ref 0–35)
AST: 19 U/L (ref 0–37)
Albumin: 3.8 g/dL (ref 3.5–5.2)
Alkaline Phosphatase: 72 U/L (ref 39–117)
BUN: 54 mg/dL — ABNORMAL HIGH (ref 6–23)
CO2: 29 mEq/L (ref 19–32)
Calcium: 10.6 mg/dL — ABNORMAL HIGH (ref 8.4–10.5)
Chloride: 102 mEq/L (ref 96–112)
Creatinine, Ser: 2.22 mg/dL — ABNORMAL HIGH (ref 0.40–1.20)
GFR: 20.01 mL/min — ABNORMAL LOW (ref >60.00)
Glucose, Bld: 95 mg/dL (ref 70–99)
Potassium: 4.2 mEq/L (ref 3.5–5.1)
Sodium: 141 mEq/L (ref 135–145)
Total Bilirubin: 0.6 mg/dL (ref 0.2–1.2)
Total Protein: 6.9 g/dL (ref 6.0–8.3)

## 2022-01-03 LAB — CBC WITH DIFFERENTIAL/PLATELET
Basophils Absolute: 0.2 10*3/uL — ABNORMAL HIGH (ref 0.0–0.1)
Basophils Relative: 2.2 % (ref 0.0–3.0)
Eosinophils Absolute: 0.5 10*3/uL (ref 0.0–0.7)
Eosinophils Relative: 5.6 % — ABNORMAL HIGH (ref 0.0–5.0)
HCT: 37.1 % (ref 36.0–46.0)
Hemoglobin: 12.2 g/dL (ref 12.0–15.0)
Lymphocytes Relative: 31.2 % (ref 12.0–46.0)
Lymphs Abs: 2.7 10*3/uL (ref 0.7–4.0)
MCHC: 32.9 g/dL (ref 30.0–36.0)
MCV: 99 fl (ref 78.0–100.0)
Monocytes Absolute: 1.3 10*3/uL — ABNORMAL HIGH (ref 0.1–1.0)
Monocytes Relative: 15.1 % — ABNORMAL HIGH (ref 3.0–12.0)
Neutro Abs: 4 10*3/uL (ref 1.4–7.7)
Neutrophils Relative %: 45.9 % (ref 43.0–77.0)
Platelets: 349 10*3/uL (ref 150.0–400.0)
RBC: 3.75 Mil/uL — ABNORMAL LOW (ref 3.87–5.11)
RDW: 15.7 % — ABNORMAL HIGH (ref 11.5–15.5)
WBC: 8.6 10*3/uL (ref 4.0–10.5)

## 2022-01-08 ENCOUNTER — Other Ambulatory Visit: Payer: Self-pay | Admitting: Internal Medicine

## 2022-01-12 ENCOUNTER — Ambulatory Visit: Payer: Medicare PPO | Admitting: Internal Medicine

## 2022-01-12 DIAGNOSIS — Z79899 Other long term (current) drug therapy: Secondary | ICD-10-CM | POA: Diagnosis not present

## 2022-01-12 DIAGNOSIS — M0579 Rheumatoid arthritis with rheumatoid factor of multiple sites without organ or systems involvement: Secondary | ICD-10-CM | POA: Diagnosis not present

## 2022-01-12 DIAGNOSIS — M1991 Primary osteoarthritis, unspecified site: Secondary | ICD-10-CM | POA: Diagnosis not present

## 2022-01-12 DIAGNOSIS — E663 Overweight: Secondary | ICD-10-CM | POA: Diagnosis not present

## 2022-01-12 DIAGNOSIS — Z6826 Body mass index (BMI) 26.0-26.9, adult: Secondary | ICD-10-CM | POA: Diagnosis not present

## 2022-01-13 ENCOUNTER — Telehealth: Payer: Self-pay | Admitting: Cardiology

## 2022-01-13 NOTE — Telephone Encounter (Signed)
Primary Cardiologist:David Ellyn Hack, MD  Chart reviewed as part of pre-operative protocol coverage. Because of Finola Rosal Vivar's past medical history and time since last visit, he/she will require a follow-up visit in order to better assess preoperative cardiovascular risk.  Pre-op covering staff: - Please schedule appointment and call patient to inform them. - Please contact requesting surgeon's office via preferred method (i.e, phone, fax) to inform them of need for appointment prior to surgery.  If applicable, this message will also be routed to pharmacy pool and/or primary cardiologist for input on holding anticoagulant/antiplatelet agent as requested below so that this information is available at time of patient's appointment.   Emmaline Life, NP-C  01/13/2022, 3:07 PM [address], [address] Office [phone] Fax [phone]

## 2022-01-13 NOTE — Telephone Encounter (Signed)
     Pre-operative Risk Assessment    Patient Name: [name]  DOB: April 05, 1941 MRN: [id number]      Request for Surgical Clearance    Procedure:   left total hip replacement   Date of Surgery:  Clearance 01/23/22                                 Surgeon:  Dr. Frankey Shown Surgeon's Group or Practice Name:  [facility]  Phone number:  [phone] Fax number:  [phone]    Type of Clearance Requested:   - Medical  - Pharmacy:  Hold Clopidogrel (Plavix) 5-7 days    Type of Anesthesia:  Spinal   Additional requests/questions:    Signed, Selinda Orion   01/13/2022, 1:53 PM

## 2022-01-13 NOTE — Telephone Encounter (Signed)
Pt has appt 01/17/22 @ 3:35 with Doreene Adas, PAC.

## 2022-01-15 NOTE — Progress Notes (Unsigned)
Cardiology Office Note:    Date:  01/15/2022   ID:  [name], DOB 1940-08-19, MRN [id number]  PCP:  Cassandria Anger, MD   [facility] Cardiologist:  Glenetta Hew, MD { Click to update primary MD,subspecialty MD or APP then REFRESH:1}    Referring MD: Plotnikov, [name], MD   No chief complaint on file. ***  History of Present Illness:    [name] is a 81 y.o. 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 Most likely scar. Mild subendocardial ischemia. EF 71% LOW RISK.    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 2016   TIA   Polymyalgia rheumatica ([place])    2011 Dr. Charlestine Night   Rheumatoid arthritis [facility]) 2011   Dr.Truslow; fracture knees, hands and wrists -    S/P CABG x 1 1996   CABG--LIMA-LAD for ostial LAD (not felt to be PCI amenable). EF NORMAL then; LIMA now atretic   Shortness of breath dyspnea    with exertion   Stroke [facility]) 11-2014   TIA    Urinary frequency     Past Surgical History:  Procedure Laterality Date   ABDOMINAL AORTIC ANEURYSM REPAIR  2458   Complicated by mesenteric artery stenosis and splenic artery infarction with acquired Asplenia   APPENDECTOMY     BUNIONECTOMY  07/2011    right foot   CARDIAC CATHETERIZATION  2005   (Most recent CATH) - ostial LAD lesion 20-30% (down from 90% initially). Atretic LIMA. Minimal disease the RCA and Circumflex system.   CARPAL TUNNEL RELEASE Left    CATARACT EXTRACTION Bilateral    CERVICAL SPINE SURGERY     plate 2008 Dr. Saintclair Halsted   CORONARY ARTERY BYPASS GRAFT  1996   INCLUDED AN INTERNAL MAMMARY ARTERY TO THE LAD. EF WAS NORMAL   INGUINAL HERNIA REPAIR Right    LAPAROSCOPIC APPENDECTOMY N/A 06/28/2016   Procedure: APPENDECTOMY LAPAROSCOPIC;  Surgeon: Leighton Ruff, MD;  Location: [facility];  Service: General;  Laterality: N/A;   NM MYOVIEW LTD  March 2012   Fixed anteroseptal, apical and inferoapical defect with moderate size. Most likely scar. Mild subendocardial ischemia. EF 71% LOW RISK.    SPLENECTOMY     TRANSTHORACIC ECHOCARDIOGRAM  12/2014   [facility]: Normal LV size & function. EF 55-60%,    vagina polyp     VIDEO ASSISTED THORACOSCOPY (VATS)/WEDGE RESECTION Left 03/02/2015   Procedure: VIDEO ASSISTED THORACOSCOPY (VATS), MINI THORACOTOMY, LEFT UPPER LOBE WEDGE, TAKE DOWN OF INTERNAL MAMMARY LESIONS, PLACEMENT OF ON-Q PUMP;  Surgeon: Grace Isaac, MD;  Location: [place];  Service: Thoracic;  Laterality: Left;   VIDEO BRONCHOSCOPY N/A 03/02/2015   Procedure: BRONCHOSCOPY;  Surgeon: Grace Isaac, MD;  Location: [place];  Service: Thoracic;  Laterality: N/A;   VIDEO BRONCHOSCOPY WITH ENDOBRONCHIAL NAVIGATION N/A 10/08/2017   Procedure: VIDEO BRONCHOSCOPY WITH ENDOBRONCHIAL NAVIGATION WITH BIOPSIES OF LEFT UPPER LOBE AND LEFT LOWER LOBE;  Surgeon: Grace Isaac, MD;  Location: [facility];  Service: Thoracic;  Laterality: N/A;    Current Medications: No outpatient medications have been marked as taking for the 01/17/22 encounter (Appointment) with Ledora Bottcher, [place].     Allergies:   Aspirin, Codeine, and Nitroglycerin   Social History   Socioeconomic History   Marital status: Married    Spouse name: Not  on file   Number of children: 4   Years of education: Not on file   Highest education level: Not on file  Occupational History   Occupation: retired    Fish farm manager: RETIRED  Tobacco Use   Smoking status: Former    Types: Cigarettes    Quit date: 12/04/1958    Years since quitting: 63.1    Passive exposure: Never   Smokeless tobacco: Never  Vaping Use   Vaping Use: Never used  Substance and Sexual Activity   Alcohol use: Yes    Comment: occassionally   Drug use: No   Sexual activity: Not Currently  Other Topics Concern   Not on file  Social History Narrative   Regular [facility]- [facility]  [place] ; [place].    Living at [place] since dec 2015   Social Determinants of Health   Financial Resource Strain: Low Risk  (05/30/2021)   Overall Financial Resource Strain (CARDIA)    Difficulty of Paying Living Expenses: Not hard at all  Food Insecurity: No Food Insecurity (05/30/2021)   Hunger Vital Sign    Worried About Running Out of Food in the Last Year: Never true    Ran Out of Food in the Last Year: Never true  Transportation Needs: No Transportation Needs (05/30/2021)   PRAPARE - Hydrologist (Medical): No    Lack of Transportation (Non-Medical): No  Physical Activity: Insufficiently Active (05/30/2021)   Exercise Vital Sign    Days of Exercise per Week: 3 days    Minutes of Exercise per Session: 30 min  Stress: No Stress Concern Present (05/30/2021)   [place]    Feeling of Stress : Not at all  Social Connections: Moderately Integrated (05/30/2021)   Social Connection and Isolation Panel [NHANES]    Frequency of Communication with Friends and Family: Twice a week    Frequency of Social Gatherings with Friends and Family: Twice a week    Attends Religious Services: Never    Marine scientist or Organizations: Yes    Attends Programme researcher, broadcasting/film/video: More than 4 times per year    Marital Status: Married     Family History: The patient's ***family history includes Cancer in her paternal uncle; Coronary artery disease in an other family member; Diabetes in her mother; Heart disease in her mother; Hyperlipidemia in her mother, sister, son, son, and son; Hypertension in her daughter, mother, sister, son, son, son, and another family member; Stroke in her father. There is no history of Asthma or Colon cancer.  ROS:   Please see the history of present illness.    *** All other  systems reviewed and are negative.  EKGs/Labs/Other Studies Reviewed:    The following studies were reviewed today: ***  EKG:  EKG is *** ordered today.  The ekg ordered today demonstrates ***  Recent Labs: 06/21/2021: TSH 2.35 01/03/2022: ALT 12; BUN 54; Creatinine, Ser 2.22; Hemoglobin 12.2; Platelets 349.0; Potassium 4.2; Sodium 141  Recent Lipid Panel    Component Value Date/Time   CHOL 157 09/07/2020 1126   CHOL 168 06/15/2020 1059   CHOL 146 04/25/2016 0852   TRIG 121.0 09/07/2020 1126   TRIG 93 04/25/2016 0852   HDL 59.30 09/07/2020 1126   HDL 67 06/15/2020 1059   HDL 60 04/25/2016 0852   CHOLHDL 3 09/07/2020 1126   VLDL 24.2 09/07/2020 1126   LDLCALC 74 09/07/2020 1126   LDLCALC 78 06/15/2020 1059   LDLCALC 68 04/25/2016 0852   LDLDIRECT 162.1 10/28/2009 0857     Risk Assessment/Calculations:   {Does this patient have ATRIAL FIBRILLATION?:[phone]}  No BP recorded.  {Refresh Note OR Click here to enter BP  :1}***         Physical Exam:    VS:  There were no vitals taken for this visit.    Wt Readings from Last 3 Encounters:  12/12/21 153 lb (69.4 kg)  11/16/21 150 lb (68 kg)  11/14/21 152 lb 12.8 oz (69.3 kg)     GEN: *** Well nourished, well developed in no acute distress HEENT: Normal NECK: No JVD; No carotid bruits LYMPHATICS: No lymphadenopathy CARDIAC: ***RRR, no murmurs, rubs, gallops RESPIRATORY:   Clear to auscultation without rales, wheezing or rhonchi  ABDOMEN: Soft, non-tender, non-distended MUSCULOSKELETAL:  No edema; No deformity  SKIN: Warm and dry NEUROLOGIC:  Alert and oriented x 3 PSYCHIATRIC:  Normal affect   ASSESSMENT:    No diagnosis found. PLAN:    In order of problems listed above:  ***      {Are you ordering a CV Procedure (e.g. stress test, cath, DCCV, TEE, etc)?   Press F2        :[id number]}    Medication Adjustments/Labs and Tests Ordered: Current medicines are reviewed at length with the patient today.  Concerns regarding medicines are outlined above.  No orders of the defined types were placed in this encounter.  No orders of the defined types were placed in this encounter.   There are no Patient Instructions on file for this visit.   Signed, Ledora Bottcher, [place]  01/15/2022 8:57 PM    [place]

## 2022-01-16 ENCOUNTER — Encounter: Payer: Self-pay | Admitting: Internal Medicine

## 2022-01-16 ENCOUNTER — Other Ambulatory Visit: Payer: Self-pay | Admitting: Physician Assistant

## 2022-01-16 ENCOUNTER — Ambulatory Visit: Payer: Medicare PPO | Admitting: Internal Medicine

## 2022-01-16 VITALS — BP 134/80 | HR 73 | Temp 97.7°F | Ht 63.0 in | Wt 150.6 lb

## 2022-01-16 DIAGNOSIS — M069 Rheumatoid arthritis, unspecified: Secondary | ICD-10-CM | POA: Diagnosis not present

## 2022-01-16 DIAGNOSIS — G459 Transient cerebral ischemic attack, unspecified: Secondary | ICD-10-CM

## 2022-01-16 DIAGNOSIS — N184 Chronic kidney disease, stage 4 (severe): Secondary | ICD-10-CM

## 2022-01-16 DIAGNOSIS — Z23 Encounter for immunization: Secondary | ICD-10-CM

## 2022-01-16 DIAGNOSIS — E034 Atrophy of thyroid (acquired): Secondary | ICD-10-CM | POA: Diagnosis not present

## 2022-01-16 MED ORDER — OXYCODONE-ACETAMINOPHEN 5-325 MG PO TABS: 1.0000 | ORAL_TABLET | Freq: Four times a day (QID) | ORAL | 0 refills | Status: DC | PRN

## 2022-01-16 MED ORDER — ENOXAPARIN SODIUM 40 MG/0.4ML IJ SOSY: 40.0000 mg | PREFILLED_SYRINGE | INTRAMUSCULAR | 0 refills | Status: DC

## 2022-01-16 MED ORDER — ONDANSETRON HCL 4 MG PO TABS: 4.0000 mg | ORAL_TABLET | Freq: Three times a day (TID) | ORAL | 0 refills | Status: DC | PRN

## 2022-01-16 MED ORDER — METHOCARBAMOL 500 MG PO TABS: 500.0000 mg | ORAL_TABLET | Freq: Two times a day (BID) | ORAL | 2 refills | Status: DC | PRN

## 2022-01-16 MED ORDER — DOCUSATE SODIUM 100 MG PO CAPS: 100.0000 mg | ORAL_CAPSULE | Freq: Every day | ORAL | 2 refills | Status: DC | PRN

## 2022-01-16 NOTE — Progress Notes (Signed)
Subjective:  Patient ID: [name], female    DOB: [birth year]  Age: 81 y.o. MRN: [id number]  CC: Follow-up (4 week f/u)   HPI [name] presents for GI bleed in 11/2021, anemia, CKD 4-5 On Plavix On Arava for RA   Outpatient Medications Prior to Visit  Medication Sig Dispense Refill   acetaminophen (TYLENOL) 500 MG tablet Take 1,000 mg by mouth every 8 (eight) hours as needed for moderate pain.     albuterol (VENTOLIN HFA) 108 (90 Base) MCG/ACT inhaler Inhale 2 puffs into the lungs every 4 (four) hours as needed for wheezing or shortness of breath. 8 g 2   amLODipine (NORVASC) 2.5 MG tablet Take 1 tablet (2.5 mg total) by mouth daily. 90 tablet 2   atorvastatin (LIPITOR) 80 MG tablet TAKE 1 TABLET EVERY DAY (Patient taking differently: Take 80 mg by mouth daily.) 90 tablet 2   Cholecalciferol (VITAMIN D3) 50 MCG (2000 UT) capsule Take 1 capsule (2,000 Units total) by mouth daily. 100 capsule 3   Cyanocobalamin (B-12) 2500 MCG TABS one po qd (Patient taking differently: Take 2,500 mcg by mouth daily.) 100 tablet 3   famotidine (PEPCID) 20 MG tablet TAKE 1 TABLET AT BEDTIME (Patient taking differently: Take 20 mg by mouth at bedtime.) 90 tablet 3   gabapentin (NEURONTIN) 300 MG capsule TAKE 1 CAPSULE BY MOUTH THREE TIMES A DAY (Patient taking differently: Take 300 mg by mouth 2 (two) times daily.) 270 capsule 1   Golimumab (SIMPONI [place]) every 2 (two) months. Every other month per Dr Trudie Reed     hydrochlorothiazide (HYDRODIURIL) 12.5 MG tablet TAKE 1 TABLET BY MOUTH EVERY DAY (Patient taking differently: Take 12.5 mg by mouth daily.) 90 tablet 3   leflunomide (ARAVA) 20 MG tablet Take 20 mg by mouth daily.      levothyroxine (SYNTHROID) 88 MCG tablet TAKE 1 TABLET (88 MCG TOTAL) BY MOUTH DAILY BEFORE BREAKFAST. (Patient taking differently: Take 44 mcg by mouth daily before breakfast.) 90 tablet 3   loperamide (IMODIUM A-D) 2 MG tablet Take 2 mg by mouth 4 (four) times daily as needed  for diarrhea or loose stools.     metoprolol tartrate (LOPRESSOR) 50 MG tablet Take 1.5 tablets (75 mg total) by mouth 2 (two) times daily. 270 tablet 3   pantoprazole (PROTONIX) 40 MG tablet TAKE 1 TABLET DAILY. TAKE 30 TO 60 MINUTES BEFORE FIRST MEAL OF THE DAY (Patient taking differently: Take 40 mg by mouth daily.) 90 tablet 3   Tiotropium Bromide-Olodaterol (STIOLTO RESPIMAT) 2.5-2.5 MCG/ACT AERS INHALE 2 PUFFS BY MOUTH INTO THE LUNGS DAILY 4 g 11   triamcinolone (NASACORT) 55 MCG/ACT AERO nasal inhaler Place 1 spray into the nose at bedtime.     psyllium (METAMUCIL) 58.6 % packet Take 1 packet by mouth daily.     No facility-administered medications prior to visit.    ROS: Review of Systems  Constitutional:  Positive for fatigue. Negative for activity change, appetite change, chills and unexpected weight change.  HENT:  Negative for congestion, mouth sores and sinus pressure.   Eyes:  Negative for visual disturbance.  Respiratory:  Negative for cough and chest tightness.   Gastrointestinal:  Negative for abdominal pain and nausea.  Genitourinary:  Negative for difficulty urinating, frequency and vaginal pain.  Musculoskeletal:  Negative for back pain and gait problem.  Skin:  Negative for pallor and rash.  Neurological:  Negative for dizziness, tremors, weakness, numbness and headaches.  Psychiatric/Behavioral:  Negative  for confusion, sleep disturbance and suicidal ideas. The patient is nervous/anxious.     Objective:  BP 134/80 (BP Location: Left Arm)   Pulse 73   Temp 97.7 F (36.5 C) (Oral)   Ht '5\' 3"'$  (1.6 m)   Wt 150 lb 9.3 oz (68.3 kg)   SpO2 94%   BMI 26.67 kg/m   BP Readings from Last 3 Encounters:  01/17/22 134/72  01/16/22 134/80  12/12/21 118/60    Wt Readings from Last 3 Encounters:  01/17/22 151 lb (68.5 kg)  01/16/22 150 lb 9.3 oz (68.3 kg)  12/12/21 153 lb (69.4 kg)    Physical Exam Constitutional:      General: She is not in acute distress.     Appearance: She is well-developed. She is obese.  HENT:     Head: Normocephalic.     Right Ear: External ear normal.     Left Ear: External ear normal.     Nose: Nose normal.  Eyes:     General:        Right eye: No discharge.        Left eye: No discharge.     Conjunctiva/sclera: Conjunctivae normal.     Pupils: Pupils are equal, round, and reactive to light.  Neck:     Thyroid: No thyromegaly.     Vascular: No JVD.     Trachea: No tracheal deviation.  Cardiovascular:     Rate and Rhythm: Normal rate and regular rhythm.     Heart sounds: Normal heart sounds.  Pulmonary:     Effort: No respiratory distress.     Breath sounds: No stridor. No wheezing.  Abdominal:     General: Bowel sounds are normal. There is no distension.     Palpations: Abdomen is soft. There is no mass.     Tenderness: There is no abdominal tenderness. There is no guarding or rebound.  Musculoskeletal:        General: No tenderness.     Cervical back: Normal range of motion and neck supple. No rigidity.  Lymphadenopathy:     Cervical: No cervical adenopathy.  Skin:    Findings: No erythema or rash.  Neurological:     Mental Status: She is oriented to person, place, and time.     Cranial Nerves: No cranial nerve deficit.     Motor: No abnormal muscle tone.     Coordination: Coordination normal.     Gait: Gait abnormal.     Deep Tendon Reflexes: Reflexes normal.  Psychiatric:        Behavior: Behavior normal.        Thought Content: Thought content normal.        Judgment: Judgment normal.   Antalgic gait w/a walker  Lab Results  Component Value Date   WBC 8.6 01/03/2022   HGB 12.2 01/03/2022   HCT 37.1 01/03/2022   PLT 349.0 01/03/2022   GLUCOSE 95 01/03/2022   CHOL 157 09/07/2020   TRIG 121.0 09/07/2020   HDL 59.30 09/07/2020   LDLDIRECT 162.1 10/28/2009   LDLCALC 74 09/07/2020   ALT 12 01/03/2022   AST 19 01/03/2022   NA 141 01/03/2022   K 4.2 01/03/2022   CL 102 01/03/2022    CREATININE 2.22 (H) 01/03/2022   BUN 54 (H) 01/03/2022   CO2 29 01/03/2022   TSH 2.35 06/21/2021   INR 0.89 05/20/2018    No results found.  Assessment & Plan:   Problem List Items Addressed This Visit  CKD (chronic kidney disease) stage 4, GFR 15-29 ml/min (HCC)    Cont on Amlodipine, Metoprolol, Lipitor Considering home HD soon      Hypothyroidism    On Levothroid      Rheumatoid arthritis ([place]) (Chronic)    On Arava      TIA (transient ischemic attack)    Cont on Amlodipine, Metoprolol, HCTZ, Lipitor, Plavix      Other Visit Diagnoses     Needs flu shot    -  Primary   Relevant Orders   Flu Vaccine QUAD High Dose(Fluad) (Completed)         No orders of the defined types were placed in this encounter.     Follow-up: Return in about 3 months (around 04/17/2022).  [name], MD

## 2022-01-16 NOTE — Assessment & Plan Note (Addendum)
Cont on Amlodipine, Metoprolol, Lipitor Considering home HD soon

## 2022-01-16 NOTE — Progress Notes (Addendum)
Cardiology Office Note:    Date:  01/20/2022   ID:  [name], DOB [birth year], MRN [id number]  PCP:  Cassandria Anger, MD   [facility] Providers Cardiologist:  Glenetta Hew, MD     Referring MD: Cassandria Anger, MD   CC: Here for pre-operative cardiovascular risk assessment   History of Present Illness:    [name] is a 81 y.o. female with a hx of the following:   Hypertension Aortic atherosclerosis CAD, s/p CABG X 1 (LIMA to LAD) in 1996 Mild to moderate MR TIA AAA, s/p repair in 1996 Splenic artery aneurysm, s/p rupture and splenectomy, chronically immunosuppressed Mesenteric artery stenosis COPD, Granulomatous lung disease Hypothyroidism RA CKD stage 4 Hx of GI bleed  History of CABG x1, follow-up cath showed minimal LAD disease and early atretic LIMA with no more evidence of significant LAD lesion.  Was on aspirin and Plavix and denied any any bleeding issues at last follow-up visit with Dr. Ellyn Hack. she is being followed by vascular surgery (Dr. Trula Slade) for history of AAA rupture and repair in conjunction with splenic artery aneurysm and mesenteric artery stenosis.  Was referred to pulmonary for new cough, former smoker, previous chest x-ray suggested pulmonary nodule.  Video bronchoscopy and biopsy in 2019 of the left lower lobe showed necrotizing granuloma ( biopsy results did not suggest malignancy.  At last visit with Dr. Ellyn Hack in 2020, was having a slow progression of dyspnea on exertion, denied any chest pain, overall doing well from a cardiac perspective.  Seen by Almyra Deforest, PA-C via telemedicine on 02/26/2020.  Was doing well from a cardiac perspective, however she had a mechanical fall that led to a left forehead hematoma as well as head hematoma.  There was no intracranial injury and no neck fracture.  Blood pressure was found to be overall stable.  Most recently was hospitalized for GI bleed in July 2023, nuclear medicine GI blood loss  did not reveal evidence of active GI hemorrhage, bleeding spontaneously stopped, held Plavix for 6 days after hospitalization and then resume.  Today she presents for pre-operative cardiovascular risk assessment  for left total hip replacement surgery scheduled on 01/23/2022.  This was delayed due to recent GI bleed.  Surgeon will be Dr. Frankey Shown of [facility]. Requesting to hold Plavix and will be receiving Spinal Anesthesia. Today she states she is doing well from a cardiac perspective.  Denies any hemoptysis, melena, hematuria, or any acute bleeding since her last hospitalization for GI bleed and stopped Plavix yesterday.  Denies any chest pain, shortness of breath, palpitations, orthopnea, PND, dizziness, presyncope, syncope, swelling, or claudication.  Blood pressures well controlled at home.  She is discussing peritoneal dialysis with her doctor regarding her history of chronic kidney disease stage IV.  When it comes to her activity, she states she was very active prior to her hip problem.  Says due to her hip problem, she will ride the cart when shops at the grocery store.  Says she is able to walk with a cane around [place], but prefers to ride in a cart.  Because of her hip problem, she is unable to climb up a flight of stairs or run a short distance.  She is able to care for herself every day, she is able to walk indoors, and she is able to walk 1-2 blocks on level ground.  Says she is able to perform light duties around the house, including dusting, and washing dishes.  She says she is able to sweep the floors.  Due to her hip problem, she says she is unable to do moderate or heavy housework around the house, and she is not able to do yard work, as well as unable to do moderate or strenuous activities.  Denies any other questions or concerns today.   Past Medical History:  Diagnosis Date   Abdominal aortic aneurysm ([place])    REPAIRED IN 1996 BY [name]  AND HAS RECENTLY BEEN FOLLOWED BY DR VAN  TRIGHT   Acquired asplenia     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 Most likely scar. Mild subendocardial ischemia. EF 71% LOW RISK.    Cervical disc disease    fracture   Chronic kidney disease    COPD (chronic obstructive pulmonary disease) (HCC)    Diverticulosis    Hyperlipidemia    Hypertension    Hypothyroidism (acquired)    hypo   Myocardial infarction ([place]) 11/2014   TIA   Polymyalgia rheumatica ([place])    2011 Dr. Charlestine Night   Rheumatoid arthritis [facility]) 2011   Dr.Truslow; fracture knees, hands and wrists -    S/P CABG x 1 1996   CABG--LIMA-LAD for ostial LAD (not felt to be PCI amenable). EF NORMAL then; LIMA now atretic   Shortness of breath dyspnea    with exertion   Stroke ([place]) 11/2014   TIA    Urinary frequency     Past Surgical History:  Procedure Laterality Date   ABDOMINAL AORTIC ANEURYSM REPAIR  9528   Complicated by mesenteric artery stenosis and splenic artery infarction with acquired Asplenia   APPENDECTOMY     BUNIONECTOMY  07/2011   right foot   CARDIAC CATHETERIZATION  2005   (Most recent CATH) - ostial LAD lesion 20-30% (down from 90% initially). Atretic LIMA. Minimal disease the RCA and Circumflex system.   CARPAL TUNNEL RELEASE Left    CATARACT EXTRACTION Bilateral    CERVICAL SPINE SURGERY     plate 2008 Dr. Saintclair Halsted   COLONOSCOPY     CORONARY ARTERY BYPASS GRAFT  1996   INCLUDED AN INTERNAL MAMMARY ARTERY TO THE LAD. EF WAS NORMAL   INGUINAL HERNIA REPAIR Right    LAPAROSCOPIC APPENDECTOMY N/A 06/28/2016   Procedure: APPENDECTOMY LAPAROSCOPIC;  Surgeon: Leighton Ruff, MD;  Location: [facility];  Service: General;   Laterality: N/A;   NM MYOVIEW LTD  06/2010   Fixed anteroseptal, apical and inferoapical defect with moderate size. Most likely scar. Mild subendocardial ischemia. EF 71% LOW RISK.    SPLENECTOMY     TRANSTHORACIC ECHOCARDIOGRAM  12/2014   [facility]: Normal LV size & function. EF 55-60%,    vagina polyp     VIDEO ASSISTED THORACOSCOPY (VATS)/WEDGE RESECTION Left 03/02/2015   Procedure: VIDEO ASSISTED THORACOSCOPY (VATS), MINI THORACOTOMY, LEFT UPPER LOBE WEDGE, TAKE DOWN OF INTERNAL MAMMARY LESIONS, PLACEMENT OF ON-Q PUMP;  Surgeon: Grace Isaac, MD;  Location: [facility];  Service: Thoracic;  Laterality: Left;   VIDEO BRONCHOSCOPY N/A 03/02/2015   Procedure: BRONCHOSCOPY;  Surgeon: Grace Isaac, MD;  Location: [facility];  Service: Thoracic;  Laterality: N/A;   VIDEO BRONCHOSCOPY WITH ENDOBRONCHIAL NAVIGATION N/A 10/08/2017   Procedure: VIDEO BRONCHOSCOPY WITH ENDOBRONCHIAL NAVIGATION WITH BIOPSIES OF LEFT UPPER LOBE AND LEFT LOWER LOBE;  Surgeon: Grace Isaac, MD;  Location: [facility];  Service: Thoracic;  Laterality: N/A;    Current Medications: Current Meds  Medication Sig   acetaminophen (TYLENOL) 500 MG tablet Take 1,000 mg by mouth every 8 (eight) hours as needed for moderate pain.   albuterol (VENTOLIN HFA) 108 (90 Base) MCG/ACT inhaler Inhale 2 puffs into the lungs every 4 (four) hours as needed for wheezing or shortness of breath.   amLODipine (NORVASC) 2.5 MG tablet Take 1 tablet (2.5 mg total) by mouth daily.   atorvastatin (LIPITOR) 80 MG tablet TAKE 1 TABLET EVERY DAY (Patient taking differently: Take 80 mg by mouth daily.)   Cholecalciferol (VITAMIN D3) 50 MCG (2000 UT) capsule Take 1 capsule (2,000 Units total) by mouth daily.   Cyanocobalamin (B-12) 2500 MCG TABS one po qd (Patient taking differently: Take 2,500 mcg by mouth daily.)   docusate sodium (COLACE) 100 MG capsule Take 1 capsule (100 mg total) by mouth daily as needed.   enoxaparin (LOVENOX) 40  MG/0.4ML injection Inject 0.4 mLs (40 mg total) into the skin daily for 14 days.   famotidine (PEPCID) 20 MG tablet TAKE 1 TABLET AT BEDTIME (Patient taking differently: Take 20 mg by mouth at bedtime.)   gabapentin (NEURONTIN) 300 MG capsule TAKE 1 CAPSULE BY MOUTH THREE TIMES A DAY (Patient taking differently: Take 300 mg by mouth 2 (two) times daily.)   Golimumab (SIMPONI [place]) every 2 (two) months. Every other month per Dr Trudie Reed   hydrochlorothiazide (HYDRODIURIL) 12.5 MG tablet TAKE 1 TABLET BY MOUTH EVERY DAY (Patient taking differently: Take 12.5 mg by mouth daily.)   leflunomide (ARAVA) 20 MG tablet Take 20 mg by mouth daily.    levothyroxine (SYNTHROID) 88 MCG tablet TAKE 1 TABLET (88 MCG TOTAL) BY MOUTH DAILY BEFORE BREAKFAST. (Patient taking differently: Take 44 mcg by mouth daily before breakfast.)   loperamide (IMODIUM A-D) 2 MG tablet Take 2 mg by mouth 4 (four) times daily as needed for diarrhea or loose stools.   methocarbamol (ROBAXIN) 500 MG tablet Take 1 tablet (500 mg total) by mouth 2 (two) times daily as needed.   metoprolol tartrate (LOPRESSOR) 50 MG tablet Take 1.5 tablets (75 mg total) by mouth 2 (two) times daily.   Multiple Vitamins-Minerals (CENTRUM ADULT PO) Take by mouth. 1 Tablet Daily.   ondansetron (ZOFRAN) 4 MG tablet Take 1 tablet (4 mg total) by mouth every 8 (eight) hours as needed for nausea or vomiting.   oxyCODONE-acetaminophen (PERCOCET) 5-325 MG tablet Take 1-2 tablets by mouth every 6 (six) hours as needed. To be taken after surgery   pantoprazole (PROTONIX) 40 MG tablet TAKE 1 TABLET DAILY. TAKE 30 TO 60 MINUTES BEFORE FIRST MEAL OF THE DAY (Patient taking differently: Take 40 mg by mouth daily.)   Tiotropium Bromide-Olodaterol (STIOLTO RESPIMAT) 2.5-2.5 MCG/ACT AERS INHALE 2 PUFFS BY MOUTH INTO THE LUNGS DAILY   triamcinolone (NASACORT) 55 MCG/ACT AERO nasal inhaler Place 1 spray into the nose at bedtime.     Allergies:   Nsaids, Aspirin, Codeine, and  Nitroglycerin   Social History   Socioeconomic History   Marital status: Married    Spouse name: Not on file   Number of children: 4   Years of education: Not on file   Highest education level: Not on  file  Occupational History   Occupation: retired    Fish farm manager: RETIRED  Tobacco Use   Smoking status: Former    Types: Cigarettes    Quit date: 12/04/1958    Years since quitting: 63.1    Passive exposure: Never   Smokeless tobacco: Never  Vaping Use   Vaping Use: Never used  Substance and Sexual Activity   Alcohol use: Yes    Comment: hardly ever   Drug use: No   Sexual activity: Not Currently  Other Topics Concern   Not on file  Social History Narrative   [facility]  [place] ; [address] [place].    Living at [place] since dec 2015   Social Determinants of Health   Financial Resource Strain: Low Risk  (05/30/2021)   Overall Financial Resource Strain (CARDIA)    Difficulty of Paying Living Expenses: Not hard at all  Food Insecurity: No Food Insecurity (05/30/2021)   Hunger Vital Sign    Worried About Running Out of Food in the Last Year: Never true    Ran Out of Food in the Last Year: Never true  Transportation Needs: No Transportation Needs (05/30/2021)   PRAPARE - Hydrologist (Medical): No    Lack of Transportation (Non-Medical): No  Physical Activity: Insufficiently Active (05/30/2021)   Exercise Vital Sign    Days of Exercise per Week: 3 days    Minutes of Exercise per Session: 30 min  Stress: No Stress Concern Present (05/30/2021)   [place]    Feeling of Stress : Not at all  Social Connections: Moderately Integrated (05/30/2021)   Social Connection and Isolation Panel [NHANES]    Frequency of Communication with Friends and Family: Twice a week    Frequency of Social Gatherings with Friends and Family: Twice a week    Attends  Religious Services: Never    Marine scientist or Organizations: Yes    Attends Music therapist: More than 4 times per year    Marital Status: Married     Family History: The patient's family history includes Cancer in her paternal uncle; Coronary artery disease in an other family member; Diabetes in her mother; Heart disease in her mother; Hyperlipidemia in her mother, sister, son, son, and son; Hypertension in her daughter, mother, sister, son, son, son, and another family member; Stroke in her father. There is no history of Asthma or Colon cancer.  ROS:   Review of Systems  Constitutional: Negative.   HENT: Negative.    Eyes: Negative.   Respiratory: Negative.    Cardiovascular: Negative.   Gastrointestinal: Negative.   Genitourinary: Negative.   Musculoskeletal:  Negative for back pain, falls, joint pain, myalgias and neck pain.  Skin: Negative.   Neurological: Negative.   Endo/Heme/Allergies: Negative.   Psychiatric/Behavioral: Negative.      Please see the history of present illness.    All other systems reviewed and are negative.  EKGs/Labs/Other Studies Reviewed:    The following studies were reviewed today:   EKG:  EKG is ordered today.  The ekg ordered today demonstrates sinus rhythm, 63 bpm, PAC's, otherwise nothing acute.   CT of chest on March 17, 2021: 1. Unchanged 0.6 cm subpleural nodule of the peripheral right lower lobe, almost certainly benign given established stability. No further follow-up is specifically indicated for this nodule. Continued follow-up as indicated by clinical oncology surveillance  protocol given history of lung cancer. 2. Unchanged postoperative findings of medial left upper lobe wedge resection. No evidence of malignant recurrence. 3. Coronary artery disease.  2D echocardiogram on March 10, 2019: 1. Left ventricular ejection fraction, by visual estimation, is 60 to  65%. The left ventricle has normal  function. There is severely increased  left ventricular hypertrophy.   2. Left ventricular diastolic parameters are consistent with Grade I  diastolic dysfunction (impaired relaxation).   3. Global right ventricle has normal systolic function.The right  ventricular size is normal. No increase in right ventricular wall  thickness.   4. Left atrial size was normal.   5. Right atrial size was normal.   6. The mitral valve is normal in structure. Mild to moderate mitral valve  regurgitation.   7. The tricuspid valve is normal in structure. Tricuspid valve  regurgitation is trivial.   8. The aortic valve is tricuspid. Aortic valve regurgitation is not  visualized. Mild aortic valve sclerosis without stenosis.   9. The pulmonic valve was normal in structure. Pulmonic valve  regurgitation is not visualized.  10. Mildly elevated pulmonary artery systolic pressure.   Cardiac monitor on April 27, 2015: Highest heart rate 128 bpm, lowest 55 bpm. Average heart rate 78 bpm Sinus rhythm and sinus tachycardia, no atrial fibrillation; occasional sinus arrhythmia Occasional PACs - with occasional atrial bigeminy Rare PVCs   The patient had chest pain symptoms documented during episodes of frequent PACs with atrial bigeminy. This would suggest that the patient's symptoms are related to the PACs.    Nuclear medicine stress test on July 14, 2010-normal.    Recent Labs: 06/21/2021: TSH 2.35 01/03/2022: ALT 12; BUN 54; Creatinine, Ser 2.22; Hemoglobin 12.2; Platelets 349.0; Potassium 4.2; Sodium 141  Recent Lipid Panel    Component Value Date/Time   CHOL 157 09/07/2020 1126   CHOL 168 06/15/2020 1059   CHOL 146 04/25/2016 0852   TRIG 121.0 09/07/2020 1126   TRIG 93 04/25/2016 0852   HDL 59.30 09/07/2020 1126   HDL 67 06/15/2020 1059   HDL 60 04/25/2016 0852   CHOLHDL 3 09/07/2020 1126   VLDL 24.2 09/07/2020 1126   LDLCALC 74 09/07/2020 1126   LDLCALC 78 06/15/2020 1059   LDLCALC 68  04/25/2016 0852   LDLDIRECT 162.1 10/28/2009 0857     Risk Assessment/Calculations:     RCRI score: 3.  Perioperative risk of major cardiac event is 11%. Duke activity status index score is 26.95.  Functional capacity in METS is 6.05.  Addendum on 01/20/22: RCRI score is 3.  Perioperative risk of major cardiac event is 11%.  Duke activity status index score is 13.45.  Functional capacity in METS is 4.40.      Physical Exam:    VS:  BP 134/72   Pulse 63   Ht '5\' 3"'  (1.6 m)   Wt 151 lb (68.5 kg)   SpO2 96%   BMI 26.75 kg/m     Wt Readings from Last 3 Encounters:  01/17/22 151 lb (68.5 kg)  01/16/22 150 lb 9.3 oz (68.3 kg)  12/12/21 153 lb (69.4 kg)     GEN: Well nourished, well developed 81 y.o. Caucasian female in no acute distress HEENT: Normal NECK: No JVD; No carotid bruits CARDIAC: RRR, no murmurs, rubs, gallops; 2+ peripheral pulses throughout, strong and equal bilaterally RESPIRATORY: Fine, diminished bibasilar crackles, overall clear and diminished to auscultation, without rales, wheezing or rhonchi, normal work of breathing MUSCULOSKELETAL: Trace, nonpitting edema to  BLE, otherwise normal; No deformity  SKIN: Skin discoloration to BLE, thin skin, warm and dry NEUROLOGIC:  Alert and oriented x 3 PSYCHIATRIC:  Normal affect   ASSESSMENT:    1. Preoperative clearance   2. Coronary artery disease involving native coronary artery of native heart without angina pectoris   3. Hx of CABG   4. Hyperlipidemia LDL goal <70   5. Aortic atherosclerosis ([place])   6. Nonrheumatic mitral valve regurgitation   7. History of AAA (abdominal aortic aneurysm) repair   8. Splenic artery aneurysm ([place])   9. S/P splenectomy   10. Mesenteric artery stenosis (HCC)   11. Hypertension, unspecified type   12. COPD GOLD I   13. Granulomatous disease ([place])   14. Hypothyroidism, unspecified type   15. CKD (chronic kidney disease) stage 4, GFR 15-29 ml/min (HCC)    PLAN:    In order of  problems listed above:  Preoperative Cardiovascular Risk Assessment     Ms. Barresi's perioperative risk of a major cardiac event is 11% according to the Revised Cardiac Risk Index (RCRI).  Therefore, she is at high risk for perioperative complications.   Her functional capacity is fair at 4.4 METs according to the Duke Activity Status Index (DASI). Recommendations: The patient requires an echocardiogram before a disposition can be made regarding surgical risk.               However, this case has been discussed with two different cardiologists, Dr. Ellyn Hack (patient's primary Cardiologist) and DOD (Dr. Acie Fredrickson).  Because patient is doing well from a cardiac perspective, both cardiologists have recommended and suggested that patient may proceed to surgery at an acceptable risk and state that she does not need stress testing or an echocardiogram prior to procedure.  Patient was informed of the 11% perioperative risk of major cardiac event, and she verbalized understanding and gave her verbal consent to me to proceed with surgery. Antiplatelet and/or Anticoagulation Recommendations: Clopidogrel (Plavix) can be held for 5-7 days prior to her surgery and resumed as soon as possible post op according to Dr. Ellyn Hack. Patient has been informed about this and has stopped taking Plavix as of Monday, January 16, 2022. We recommend that aspirin 81 mg daily to be taken perioperatively before procedure, however if patient is considered to be a high bleeding risk, this may be held and resumed as soon as possible post-op.  She understands that she will initiate Lovenox for DVT prophylaxis post procedure according to surgeon's recommendations.  Will route this note to the requesting party.  This patient does not require antibiotic prophylaxis.   2.  Coronary artery disease, status post CABG x1, hyperlipidemia with LDL goal less than 70, aortic atherosclerosis-chronic, stable Stable with no anginal symptoms. No indication  for ischemic evaluation.  Continue Lipitor daily and recommend aspirin 81 mg daily to be taken perioperatively, however if patient is a bleeding risk, recommend holding aspirin.  Continue to follow-up with PCP.  Continue current medication regimen and continue to hold Plavix as mentioned and resume Lovenox bridging postprocedure as mentioned above.  3.  Mitral regurgitation-chronic, stable Echocardiogram in 2020 revealed LVEF 60 to 65% with grade 1 diastolic dysfunction, severely increased left ventricular hypertrophy with mild to moderate mitral valve regurgitation noted.  Patient is currently asymptomatic and no murmur was heard on examination.  Recommend updating 2D echocardiogram at next office visit; however this does not need to be done prior to her upcoming procedure for next Monday.  4.  AAA,  status post repair, splenic artery aneurysm, status post splenectomy and mesenteric artery stenosis AAA with status post repair and 96 and splenectomy due to splenic rupture, and mesenteric artery stenosis.  She is being followed closely by vascular surgery.  Denies any issues and is in stable condition.  Continue to follow-up with [facility].  5.  Hypertension-chronic, stable Blood pressure well controlled at home.  BP 134/72 today. Discussed to monitor BP at home at least 2 hours after medications and sitting for 5-10 minutes.  Discussed to let me know if BP at home continues to remain elevated greater than 885 systolic.  She verbalized understanding.  Continue current medication regimen.  6 COPD, granulomatous disease - chronic, stable Follows Dr. Melvyn Novas of pulmonology.  No recent exacerbations or worsening symptoms.  Continue current medical therapy.  7. Hypothyroidism - chronic, stable TSH from February 2023 was normal at 2.35.  No current symptoms.  PCP to manage.  Continue levothyroxine.  8. CKD stage 4 - chronic, stable Most recent creatinine from January 03, 2022 was 2.22 and EGFR is 20.01.  She is  currently discussing hemodialysis at home (peritoneal) with her doctor.  Recommended that she continue to follow-up with PCP and kidney doctor regarding this.  9.  Disposition: Follow-up with Dr. Ellyn Hack in 4 to 6 months or sooner if anything changes.  Medication Adjustments/Labs and Tests Ordered: Current medicines are reviewed at length with the patient today.  Concerns regarding medicines are outlined above.  Orders Placed This Encounter  Procedures   EKG 12-Lead   No orders of the defined types were placed in this encounter.   Patient Instructions  Medication Instructions:  Your physician recommends that you continue on your current medications as directed. Please refer to the Current Medication list given to you today.  *If you need a refill on your cardiac medications before your next appointment, please call your pharmacy*  Please take your blood pressure daily for 2 weeks and send in a MyChart message. Please include heart rates.   HOW TO TAKE YOUR BLOOD PRESSURE: Rest 5 minutes before taking your blood pressure. Don't smoke or drink caffeinated beverages for at least 30 minutes before. Take your blood pressure before (not after) you eat. Sit comfortably with your back supported and both feet on the floor (don't cross your legs). Elevate your arm to heart level on a table or a desk. Use the proper sized cuff. It should fit smoothly and snugly around your bare upper arm. There should be enough room to slip a fingertip under the cuff. The bottom edge of the cuff should be 1 inch above the crease of the elbow. Ideally, take 3 measurements at one sitting and record the average.   Lab Work: NONE ordered at this time of appointment  If you have labs (blood work) drawn today and your tests are completely normal, you will receive your results only by: [place] (if you have MyChart) OR A paper copy in the mail If you have any lab test that is abnormal or we need to change your  treatment, we will call you to review the results.   Testing/Procedures: NONE ordered at this time of appointment    Follow-Up: At [facility], you and your health needs are our priority.  As part of our continuing mission to provide you with exceptional heart care, we have created designated Provider Care Teams.  These Care Teams include your primary Cardiologist (physician) and Advanced Practice Providers (APPs -  Physician  Assistants and Nurse Practitioners) who all work together to provide you with the care you need, when you need it.  We recommend signing up for the patient portal called "MyChart".  Sign up information is provided on this After Visit Summary.  MyChart is used to connect with patients for Virtual Visits (Telemedicine).  Patients are able to view lab/test results, encounter notes, upcoming appointments, etc.  Non-urgent messages can be sent to your provider as well.   To learn more about what you can do with MyChart, go to [email or web address].    Your next appointment:   4-6 month(s)  The format for your next appointment:   In Person  Provider:   Glenetta Hew, MD      Signed, Finis Bud, NP  01/20/2022 12:54 PM    [place]

## 2022-01-16 NOTE — Assessment & Plan Note (Signed)
On [place]

## 2022-01-16 NOTE — Assessment & Plan Note (Signed)
Cont on Amlodipine, Metoprolol, HCTZ, Lipitor, Plavix

## 2022-01-16 NOTE — Assessment & Plan Note (Signed)
On Levothroid 

## 2022-01-17 ENCOUNTER — Encounter: Payer: Self-pay | Admitting: Physician Assistant

## 2022-01-17 ENCOUNTER — Ambulatory Visit: Payer: Medicare PPO | Attending: Physician Assistant | Admitting: Nurse Practitioner

## 2022-01-17 VITALS — BP 134/72 | HR 63 | Ht 63.0 in | Wt 151.0 lb

## 2022-01-17 DIAGNOSIS — J449 Chronic obstructive pulmonary disease, unspecified: Secondary | ICD-10-CM

## 2022-01-17 DIAGNOSIS — I700 Atherosclerosis of aorta: Secondary | ICD-10-CM | POA: Diagnosis not present

## 2022-01-17 DIAGNOSIS — I2510 Atherosclerotic heart disease of native coronary artery without angina pectoris: Secondary | ICD-10-CM | POA: Diagnosis not present

## 2022-01-17 DIAGNOSIS — I728 Aneurysm of other specified arteries: Secondary | ICD-10-CM | POA: Diagnosis not present

## 2022-01-17 DIAGNOSIS — I10 Essential (primary) hypertension: Secondary | ICD-10-CM

## 2022-01-17 DIAGNOSIS — Z9889 Other specified postprocedural states: Secondary | ICD-10-CM

## 2022-01-17 DIAGNOSIS — Z01818 Encounter for other preprocedural examination: Secondary | ICD-10-CM

## 2022-01-17 DIAGNOSIS — E785 Hyperlipidemia, unspecified: Secondary | ICD-10-CM | POA: Diagnosis not present

## 2022-01-17 DIAGNOSIS — I340 Nonrheumatic mitral (valve) insufficiency: Secondary | ICD-10-CM | POA: Diagnosis not present

## 2022-01-17 DIAGNOSIS — K551 Chronic vascular disorders of intestine: Secondary | ICD-10-CM

## 2022-01-17 DIAGNOSIS — Z9081 Acquired absence of spleen: Secondary | ICD-10-CM | POA: Diagnosis not present

## 2022-01-17 DIAGNOSIS — D71 Functional disorders of polymorphonuclear neutrophils: Secondary | ICD-10-CM

## 2022-01-17 DIAGNOSIS — N184 Chronic kidney disease, stage 4 (severe): Secondary | ICD-10-CM

## 2022-01-17 DIAGNOSIS — Z951 Presence of aortocoronary bypass graft: Secondary | ICD-10-CM | POA: Diagnosis not present

## 2022-01-17 DIAGNOSIS — E039 Hypothyroidism, unspecified: Secondary | ICD-10-CM

## 2022-01-17 NOTE — Patient Instructions (Addendum)
Medication Instructions:  Your physician recommends that you continue on your current medications as directed. Please refer to the Current Medication list given to you today.  *If you need a refill on your cardiac medications before your next appointment, please call your pharmacy*  Please take your blood pressure daily for 2 weeks and send in a MyChart message. Please include heart rates.   HOW TO TAKE YOUR BLOOD PRESSURE: Rest 5 minutes before taking your blood pressure. Don't smoke or drink caffeinated beverages for at least 30 minutes before. Take your blood pressure before (not after) you eat. Sit comfortably with your back supported and both feet on the floor (don't cross your legs). Elevate your arm to heart level on a table or a desk. Use the proper sized cuff. It should fit smoothly and snugly around your bare upper arm. There should be enough room to slip a fingertip under the cuff. The bottom edge of the cuff should be 1 inch above the crease of the elbow. Ideally, take 3 measurements at one sitting and record the average.   Lab Work: NONE ordered at this time of appointment  If you have labs (blood work) drawn today and your tests are completely normal, you will receive your results only by: [place] (if you have MyChart) OR A paper copy in the mail If you have any lab test that is abnormal or we need to change your treatment, we will call you to review the results.   Testing/Procedures: NONE ordered at this time of appointment    Follow-Up: At [facility], you and your health needs are our priority.  As part of our continuing mission to provide you with exceptional heart care, we have created designated Provider Care Teams.  These Care Teams include your primary Cardiologist (physician) and Advanced Practice Providers (APPs -  Physician Assistants and Nurse Practitioners) who all work together to provide you with the care you need, when you need it.  We  recommend signing up for the patient portal called "MyChart".  Sign up information is provided on this After Visit Summary.  MyChart is used to connect with patients for Virtual Visits (Telemedicine).  Patients are able to view lab/test results, encounter notes, upcoming appointments, etc.  Non-urgent messages can be sent to your provider as well.   To learn more about what you can do with MyChart, go to [email or web address].    Your next appointment:   4-6 month(s)  The format for your next appointment:   In Person  Provider:   Glenetta Hew, MD

## 2022-01-19 ENCOUNTER — Other Ambulatory Visit: Payer: Self-pay

## 2022-01-19 ENCOUNTER — Ambulatory Visit: Payer: Medicare PPO | Admitting: Physician Assistant

## 2022-01-19 ENCOUNTER — Encounter (HOSPITAL_COMMUNITY): Payer: Self-pay | Admitting: Orthopaedic Surgery

## 2022-01-19 NOTE — Progress Notes (Signed)
SDW CALL  Patient was given pre-op instructions over the phone. The opportunity was given for the patient to ask questions. No further questions asked. Patient verbalized understanding of instructions given.   PCP - Dr Alain Marion  Cardiologist - Ellyn Hack   Chest x-ray - n/a EKG - 01/17/22 Stress Test - > 10 ECHO - 2020 Cardiac Cath - 2005  ERAS Protcol - yes, clear liquids until 0430 PRE-SURGERY Ensure or G2- n/a  COVID TEST- n/a  Contact surgeon for instructions for Arava  Anesthesia review: yes, hx CABG  Patient denies shortness of breath, fever, cough and chest pain over the phone call   All instructions explained to the patient, with a verbal understanding of the material. Patient agrees to go over the instructions while at home for a better understanding.

## 2022-01-20 ENCOUNTER — Telehealth: Payer: Self-pay | Admitting: Orthopaedic Surgery

## 2022-01-20 NOTE — Telephone Encounter (Signed)
Patient is scheduled for left total hip on Monday  01-23-22. She has been to her pre-op appointment, but was told to consult with surgeon regarding the following medication:    Leflunomide Brand name: Arava Immunosuppressive drug It can treat rheumatoid arthritis in adults.  Shila writes:  I spoke to someone today about my instructions for surgery. There was just one question - is it OK to take my Arava on day of surgery?

## 2022-01-20 NOTE — Telephone Encounter (Signed)
correct 

## 2022-01-20 NOTE — Progress Notes (Signed)
Anesthesia Chart Review: Same day workup  Follows cardiology for history of CAD s/p remote CABG x1 (LIMA to LAD) in 1996, mild to moderate MR, TIA.  Last seen by Finis Bud, NP 01/17/2022 for preop evaluation.  Per note, "Overall she is doing well from a cardiac perspective.  RCRI score is 3.  Perioperative risk of major cardiac event is 11%.  Duke activity status index score is 26.95.  Functional capacity METS is 6.05.  She is at an acceptable risk to proceed with her surgery.  She has stopped taking Plavix as of yesterday.  She is to continue to hold this until after surgery, and we will proceed with Lovenox bridging postprocedure. Currently patient is not on aspirin, and recommend aspirin 81 mg daily to be taken perioperatively before procedure; however if patient is considered to be a high bleeding risk, this may be held and resumed postprocedure.  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  Last seen by Dr. Trula Slade 11/14/2021.  MRI was noted to be stable and recommended repeat in 2 years.  Follows with pulmonologist Dr. Melvyn Novas for history of COPD Gold 1, upper airway cough syndrome, history of adenomatous disease s/p resection of left upper lobe lung nodule 03/2015. Last seen 11/07/2021 for preop evaluation.  Per note, "Walked on RA   x  3  lap(s) =  approx 750  ft  @ rollator pace, stopped due to end of study  with lowest 02 sats 95%. Pt is Group B in terms of symptom/risk and laba/lama therefore appropriate rx at this point >>>  stiolto one or two puffs each am and approp saba. Ok for L hip surgery unless change in doe or new cough in the meantime."  History of rheumatoid arthritis, maintained on leflunomide  CKD 4, baseline creatinine ~2.0.  CMP and CBC from 9/23 reviewed, creatinine  elevated at 2.22 consistent with history of CKD 4, labs otherwise unremarkable.  Patient will need to have surgery evaluation.  EKG 01/18/2022: Sinus rhythm with PACs.  Rate 63.  CT chest 03/17/2021: IMPRESSION: 1. Unchanged 0.6 cm subpleural nodule of the peripheral right lower lobe, almost certainly benign given established stability. No further follow-up is specifically indicated for this nodule. Continued follow-up as indicated by clinical oncology surveillance protocol given history of lung cancer. 2. Unchanged postoperative findings of medial left upper lobe wedge resection. No evidence of malignant recurrence. 3. Coronary artery disease.  TTE 03/10/2019:  1. Left ventricular ejection fraction, by visual estimation, is 60 to  65%. The left ventricle has normal function. There is severely increased  left ventricular hypertrophy.   2. Left ventricular diastolic parameters are consistent with Grade I  diastolic dysfunction (impaired relaxation).   3. Global right ventricle has normal systolic function.The right  ventricular size is normal. No increase in right ventricular wall  thickness.   4. Left atrial size was normal.   5. Right atrial size was normal.   6. The mitral valve is normal in structure. Mild to moderate mitral valve  regurgitation.   7. The tricuspid valve is normal in structure. Tricuspid valve  regurgitation is trivial.   8. The aortic valve is tricuspid. Aortic valve regurgitation is not  visualized. Mild aortic valve sclerosis without stenosis.   9. The pulmonic valve was normal in structure.  Pulmonic valve  regurgitation is not visualized.  10. Mildly elevated pulmonary artery systolic pressure.   Event monitor 04/28/2015: Highest heart rate 128 bpm, lowest 55 bpm. Average heart rate 78 bpm Sinus rhythm and sinus tachycardia, no atrial fibrillation; occasional sinus arrhythmia Occasional PACs - with occasional atrial bigeminy Rare PVCs   The patient  had chest pain symptoms documented during episodes of frequent PACs with atrial bigeminy. This would suggest that the patient's symptoms are related to the PACs.   Wynonia Musty [facility] Short Stay Center/Anesthesiology Phone [phone] 01/20/2022 12:35 PM

## 2022-01-20 NOTE — Anesthesia Preprocedure Evaluation (Signed)
Anesthesia Evaluation  Patient identified by MRN, date of birth, ID band Patient awake    Reviewed: Allergy & Precautions, NPO status , Patient's Chart, lab work & pertinent test results, reviewed documented beta blocker date and time   History of Anesthesia Complications Negative for: history of anesthetic complications  Airway Mallampati: III  TM Distance: >3 FB Neck ROM: Full    Dental no notable dental hx.    Pulmonary COPD,  COPD inhaler, former smoker,    Pulmonary exam normal        Cardiovascular hypertension, Pt. on medications and Pt. on home beta blockers + CAD, + Past MI, + CABG (1996) and + Peripheral Vascular Disease  Normal cardiovascular exam  TTE 2020: EF 60-65%, severe LVH, grade 1 DD, mild to moderate MR, mildly elevated PASP   Neuro/Psych TIA (2016)negative psych ROS   GI/Hepatic Neg liver ROS, GERD  Medicated and Controlled,  Endo/Other  Hypothyroidism   Renal/GU Renal InsufficiencyRenal disease (GFR 20)  negative genitourinary   Musculoskeletal  (+) Arthritis , Polymyalgia rheumatica    Abdominal   Peds  Hematology On lovenox   Anesthesia Other Findings Day of surgery medications reviewed with patient.  Reproductive/Obstetrics negative OB ROS                           Anesthesia Physical Anesthesia Plan  ASA: 3  Anesthesia Plan: Spinal   Post-op Pain Management: Tylenol PO (pre-op)*   Induction:   PONV Risk Score and Plan: 2 and Treatment may vary due to age or medical condition, Dexamethasone, Ondansetron and Propofol infusion  Airway Management Planned: Natural Airway and Simple Face Mask  Additional Equipment: None  Intra-op Plan:   Post-operative Plan:   Informed Consent: I have reviewed the patients History and Physical, chart, labs and discussed the procedure including the risks, benefits and alternatives for the proposed anesthesia with the patient  or authorized representative who has indicated his/her understanding and acceptance.     Dental advisory given  Plan Discussed with: CRNA  Anesthesia Plan Comments: (PAT note by Karoline Caldwell, PA-C: [facility] for history of CAD s/p remote CABG x1 (LIMA to LAD) in 1996, mild to moderate MR, TIA.  Last seen by Finis Bud, NP 01/17/2022 for preop evaluation.  Per note, "Overall she is doing well from a cardiac perspective. RCRI score is 3. Perioperative risk of major cardiac event is 11%. Duke activity status index score is 26.95. Functional capacity METS is 6.05. She is at an acceptable risk to proceed with her surgery. She has stopped taking Plavix as of yesterday. She is to continue to hold this until after surgery,and we will proceed with Lovenox bridging postprocedure. 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  Last seen by Dr. Trula Slade 11/14/2021.  MRI was noted to be stable and recommended repeat in 2 years.  Follows with pulmonologist Dr. Melvyn Novas for history of COPD Gold 1, upper airway cough syndrome, history of adenomatous disease s/p resection of left upper lobe lung nodule 03/2015. Last seen 11/07/2021 for preop evaluation.  Per  note, "Walked on RA x 3 lap(s) = approx 750 ft @ rollator pace, stopped due to end of study with lowest 02 sats 95%. Pt is Group B in terms of symptom/risk and laba/lama therefore appropriate rx at this point >>>stiolto one or two puffs each am and approp saba. Ok for L hip surgery unless change in doe or new cough  in the meantime."  History of rheumatoid arthritis, maintained on leflunomide  CKD 4, baseline creatinine ~2.0.  CMP and CBC from 9/23 reviewed, creatinine elevated at 2.22 consistent with history of CKD 4, labs otherwise unremarkable.  Patient will need to have surgery evaluation.  EKG 01/18/2022: Sinus rhythm with PACs.  Rate 63.  CT chest 03/17/2021: IMPRESSION: 1. Unchanged 0.6 cm subpleural nodule of the peripheral right lower lobe, almost certainly benign given established stability. No further follow-up is specifically indicated for this nodule. Continued follow-up as indicated by clinical oncology surveillance protocol given history of lung cancer. 2. Unchanged postoperative findings of medial left upper lobe wedge resection. No evidence of malignant recurrence. 3. Coronary artery disease.  TTE 03/10/2019: 1. Left ventricular ejection fraction, by visual estimation, is 60 to  65%. The left ventricle has normal function. There is severely increased  left ventricular hypertrophy.  2. Left ventricular diastolic parameters are consistent with Grade I  diastolic dysfunction (impaired relaxation).  3. Global right ventricle has normal systolic function.The right  ventricular size is normal. No increase in right ventricular wall  thickness.  4. Left atrial size was normal.  5. Right atrial size was normal.  6. The mitral valve is normal in structure. Mild to moderate mitral valve  regurgitation.  7. The tricuspid valve is normal in structure. Tricuspid valve  regurgitation is trivial.  8. The aortic valve is tricuspid. Aortic valve regurgitation is not  visualized. Mild aortic valve sclerosis without stenosis.  9. The pulmonic valve was normal in structure. Pulmonic valve  regurgitation is not visualized.  10. Mildly elevated pulmonary artery systolic pressure.   Event monitor 04/28/2015: . Highest heart rate 128 bpm, lowest 55 bpm. Average heart rate 78  bpm . Sinus rhythm and sinus tachycardia, no atrial fibrillation; occasional sinus arrhythmia . Occasional PACs - with occasional atrial bigeminy . Rare PVCs  The patient had chest pain symptoms documented during episodes of frequent PACs with atrial bigeminy. This would suggest that the patient's symptoms are related to the PACs.  )      Anesthesia Quick Evaluation

## 2022-01-20 NOTE — Telephone Encounter (Signed)
Dont take it day of surgery

## 2022-01-22 ENCOUNTER — Telehealth: Payer: Self-pay | Admitting: *Deleted

## 2022-01-22 NOTE — Care Plan (Signed)
[facility] RNCM call to patient to discuss her upcoming Left total hip arthroplasty with Dr. Erlinda Hong on 01/23/22. She is an Ortho bundle patient through [facility] and is agreeable to case management. She lives with her spouse who will be assisting after surgery. She has a RW and cane, but does not have a 3in1/BSC. Anticipate HHPT will be needed after short hospital stay. Referral made to [facility] after choice provided. Reviewed all post op care instructions. Will continue to follow for needs.

## 2022-01-22 NOTE — Telephone Encounter (Signed)
Ortho bundle pre-op call completed. 

## 2022-01-23 ENCOUNTER — Ambulatory Visit (HOSPITAL_COMMUNITY): Payer: Medicare PPO

## 2022-01-23 ENCOUNTER — Ambulatory Visit (HOSPITAL_COMMUNITY): Payer: Medicare PPO | Admitting: Physician Assistant

## 2022-01-23 ENCOUNTER — Ambulatory Visit (HOSPITAL_BASED_OUTPATIENT_CLINIC_OR_DEPARTMENT_OTHER): Payer: Medicare PPO | Admitting: Physician Assistant

## 2022-01-23 ENCOUNTER — Other Ambulatory Visit: Payer: Self-pay

## 2022-01-23 ENCOUNTER — Observation Stay (HOSPITAL_COMMUNITY): Payer: Medicare PPO

## 2022-01-23 ENCOUNTER — Inpatient Hospital Stay (HOSPITAL_COMMUNITY)
Admission: AD | Admit: 2022-01-23 | Discharge: 2022-01-30 | DRG: 470 | Disposition: A | Discharge status: Skilled Nursing Facility | Payer: Medicare PPO | Attending: Orthopaedic Surgery | Admitting: Orthopaedic Surgery

## 2022-01-23 ENCOUNTER — Encounter (HOSPITAL_COMMUNITY)
Admission: AD | Disposition: A | Discharge status: Skilled Nursing Facility | Payer: Self-pay | Source: Home / Self Care | Attending: Orthopaedic Surgery

## 2022-01-23 DIAGNOSIS — Z86010 Personal history of colonic polyps: Secondary | ICD-10-CM | POA: Diagnosis not present

## 2022-01-23 DIAGNOSIS — Z79899 Other long term (current) drug therapy: Secondary | ICD-10-CM

## 2022-01-23 DIAGNOSIS — Z9049 Acquired absence of other specified parts of digestive tract: Secondary | ICD-10-CM

## 2022-01-23 DIAGNOSIS — Z79890 Hormone replacement therapy (postmenopausal): Secondary | ICD-10-CM

## 2022-01-23 DIAGNOSIS — J449 Chronic obstructive pulmonary disease, unspecified: Secondary | ICD-10-CM | POA: Diagnosis present

## 2022-01-23 DIAGNOSIS — Z951 Presence of aortocoronary bypass graft: Secondary | ICD-10-CM

## 2022-01-23 DIAGNOSIS — I82451 Acute embolism and thrombosis of right peroneal vein: Secondary | ICD-10-CM | POA: Diagnosis present

## 2022-01-23 DIAGNOSIS — E039 Hypothyroidism, unspecified: Secondary | ICD-10-CM | POA: Diagnosis present

## 2022-01-23 DIAGNOSIS — I252 Old myocardial infarction: Secondary | ICD-10-CM | POA: Diagnosis not present

## 2022-01-23 DIAGNOSIS — Z8249 Family history of ischemic heart disease and other diseases of the circulatory system: Secondary | ICD-10-CM | POA: Diagnosis not present

## 2022-01-23 DIAGNOSIS — R531 Weakness: Secondary | ICD-10-CM | POA: Diagnosis not present

## 2022-01-23 DIAGNOSIS — N184 Chronic kidney disease, stage 4 (severe): Secondary | ICD-10-CM | POA: Diagnosis present

## 2022-01-23 DIAGNOSIS — D62 Acute posthemorrhagic anemia: Secondary | ICD-10-CM | POA: Diagnosis not present

## 2022-01-23 DIAGNOSIS — Z471 Aftercare following joint replacement surgery: Secondary | ICD-10-CM | POA: Diagnosis not present

## 2022-01-23 DIAGNOSIS — M659 Synovitis and tenosynovitis, unspecified: Secondary | ICD-10-CM | POA: Diagnosis present

## 2022-01-23 DIAGNOSIS — Z9081 Acquired absence of spleen: Secondary | ICD-10-CM | POA: Diagnosis not present

## 2022-01-23 DIAGNOSIS — M1612 Unilateral primary osteoarthritis, left hip: Secondary | ICD-10-CM

## 2022-01-23 DIAGNOSIS — M7989 Other specified soft tissue disorders: Secondary | ICD-10-CM | POA: Diagnosis not present

## 2022-01-23 DIAGNOSIS — Z96642 Presence of left artificial hip joint: Secondary | ICD-10-CM | POA: Diagnosis not present

## 2022-01-23 DIAGNOSIS — I2510 Atherosclerotic heart disease of native coronary artery without angina pectoris: Secondary | ICD-10-CM | POA: Diagnosis present

## 2022-01-23 DIAGNOSIS — K5790 Diverticulosis of intestine, part unspecified, without perforation or abscess without bleeding: Secondary | ICD-10-CM | POA: Diagnosis not present

## 2022-01-23 DIAGNOSIS — R339 Retention of urine, unspecified: Secondary | ICD-10-CM | POA: Diagnosis not present

## 2022-01-23 DIAGNOSIS — Z886 Allergy status to analgesic agent status: Secondary | ICD-10-CM

## 2022-01-23 DIAGNOSIS — Z87891 Personal history of nicotine dependence: Secondary | ICD-10-CM | POA: Diagnosis not present

## 2022-01-23 DIAGNOSIS — I129 Hypertensive chronic kidney disease with stage 1 through stage 4 chronic kidney disease, or unspecified chronic kidney disease: Secondary | ICD-10-CM | POA: Diagnosis present

## 2022-01-23 DIAGNOSIS — Q8901 Asplenia (congenital): Secondary | ICD-10-CM

## 2022-01-23 DIAGNOSIS — R609 Edema, unspecified: Secondary | ICD-10-CM | POA: Diagnosis not present

## 2022-01-23 DIAGNOSIS — M069 Rheumatoid arthritis, unspecified: Secondary | ICD-10-CM | POA: Diagnosis present

## 2022-01-23 DIAGNOSIS — Z888 Allergy status to other drugs, medicaments and biological substances status: Secondary | ICD-10-CM

## 2022-01-23 DIAGNOSIS — Z823 Family history of stroke: Secondary | ICD-10-CM

## 2022-01-23 DIAGNOSIS — M353 Polymyalgia rheumatica: Secondary | ICD-10-CM | POA: Diagnosis present

## 2022-01-23 DIAGNOSIS — M25552 Pain in left hip: Secondary | ICD-10-CM | POA: Diagnosis present

## 2022-01-23 DIAGNOSIS — E785 Hyperlipidemia, unspecified: Secondary | ICD-10-CM | POA: Diagnosis present

## 2022-01-23 DIAGNOSIS — M5090 Cervical disc disorder, unspecified, unspecified cervical region: Secondary | ICD-10-CM | POA: Diagnosis not present

## 2022-01-23 DIAGNOSIS — Z8679 Personal history of other diseases of the circulatory system: Secondary | ICD-10-CM

## 2022-01-23 DIAGNOSIS — Z885 Allergy status to narcotic agent status: Secondary | ICD-10-CM

## 2022-01-23 DIAGNOSIS — Z83438 Family history of other disorder of lipoprotein metabolism and other lipidemia: Secondary | ICD-10-CM

## 2022-01-23 DIAGNOSIS — Z743 Need for continuous supervision: Secondary | ICD-10-CM | POA: Diagnosis not present

## 2022-01-23 DIAGNOSIS — Z7901 Long term (current) use of anticoagulants: Secondary | ICD-10-CM

## 2022-01-23 DIAGNOSIS — Z8673 Personal history of transient ischemic attack (TIA), and cerebral infarction without residual deficits: Secondary | ICD-10-CM

## 2022-01-23 DIAGNOSIS — I10 Essential (primary) hypertension: Secondary | ICD-10-CM

## 2022-01-23 DIAGNOSIS — I714 Abdominal aortic aneurysm, without rupture, unspecified: Secondary | ICD-10-CM | POA: Diagnosis not present

## 2022-01-23 DIAGNOSIS — Z7969 Long term (current) use of other immunomodulators and immunosuppressants: Secondary | ICD-10-CM

## 2022-01-23 DIAGNOSIS — Z96641 Presence of right artificial hip joint: Secondary | ICD-10-CM

## 2022-01-23 DIAGNOSIS — D649 Anemia, unspecified: Secondary | ICD-10-CM | POA: Diagnosis not present

## 2022-01-23 HISTORY — PX: TOTAL HIP ARTHROPLASTY: SHX124

## 2022-01-23 LAB — CBC
HCT: 36.3 % (ref 36.0–46.0)
Hemoglobin: 12.2 g/dL (ref 12.0–15.0)
MCH: 32.7 pg (ref 26.0–34.0)
MCHC: 33.6 g/dL (ref 30.0–36.0)
MCV: 97.3 fL (ref 80.0–100.0)
Platelets: 323 10*3/uL (ref 150–400)
RBC: 3.73 MIL/uL — ABNORMAL LOW (ref 3.87–5.11)
RDW: 15.5 % (ref 11.5–15.5)
WBC: 9.5 10*3/uL (ref 4.0–10.5)
nRBC: 0.5 % — ABNORMAL HIGH (ref 0.0–0.2)

## 2022-01-23 LAB — BASIC METABOLIC PANEL
Anion gap: 11 (ref 5–15)
BUN: 65 mg/dL — ABNORMAL HIGH (ref 8–23)
CO2: 26 mmol/L (ref 22–32)
Calcium: 9.8 mg/dL (ref 8.9–10.3)
Chloride: 97 mmol/L — ABNORMAL LOW (ref 98–111)
Creatinine, Ser: 2.71 mg/dL — ABNORMAL HIGH (ref 0.44–1.00)
GFR, Estimated: 17 mL/min — ABNORMAL LOW (ref >60)
Glucose, Bld: 90 mg/dL (ref 70–99)
Potassium: 4.3 mmol/L (ref 3.5–5.1)
Sodium: 134 mmol/L — ABNORMAL LOW (ref 135–145)

## 2022-01-23 LAB — SURGICAL PCR SCREEN
MRSA, PCR: NEGATIVE
Staphylococcus aureus: POSITIVE — AB

## 2022-01-23 LAB — TYPE AND SCREEN
ABO/RH(D): O POS
Antibody Screen: NEGATIVE

## 2022-01-23 SURGERY — ARTHROPLASTY, HIP, TOTAL, ANTERIOR APPROACH
Anesthesia: Spinal | Site: Hip | Laterality: Left

## 2022-01-23 MED ORDER — MAGNESIUM CITRATE PO SOLN: 1.0000 | Freq: Once | ORAL | Status: DC | PRN

## 2022-01-23 MED ORDER — MENTHOL 3 MG MT LOZG: 1.0000 | LOZENGE | OROMUCOSAL | Status: DC | PRN

## 2022-01-23 MED ORDER — FENTANYL CITRATE (PF) 250 MCG/5ML IJ SOLN
INTRAMUSCULAR | Status: AC
  Filled 2022-01-23: qty 5

## 2022-01-23 MED ORDER — DEXAMETHASONE SODIUM PHOSPHATE 10 MG/ML IJ SOLN
10.0000 mg | Freq: Once | INTRAMUSCULAR | Status: AC
  Administered 2022-01-24: 10 mg via INTRAVENOUS
  Filled 2022-01-23: qty 1

## 2022-01-23 MED ORDER — FERROUS SULFATE 325 (65 FE) MG PO TABS
325.0000 mg | ORAL_TABLET | Freq: Three times a day (TID) | ORAL | Status: DC
  Administered 2022-01-23 – 2022-01-30 (×20): 325 mg via ORAL
  Filled 2022-01-23 (×18): qty 1

## 2022-01-23 MED ORDER — POLYETHYLENE GLYCOL 3350 17 G PO PACK
17.0000 g | PACK | Freq: Every day | ORAL | Status: DC
  Administered 2022-01-24 – 2022-01-30 (×7): 17 g via ORAL
  Filled 2022-01-23 (×7): qty 1

## 2022-01-23 MED ORDER — SORBITOL 70 % SOLN
30.0000 mL | Freq: Every day | Status: DC | PRN
  Filled 2022-01-23 (×2): qty 30

## 2022-01-23 MED ORDER — LABETALOL HCL 5 MG/ML IV SOLN
INTRAVENOUS | Status: AC
  Filled 2022-01-23: qty 4

## 2022-01-23 MED ORDER — ENOXAPARIN SODIUM 40 MG/0.4ML IJ SOSY: 40.0000 mg | PREFILLED_SYRINGE | INTRAMUSCULAR | Status: DC

## 2022-01-23 MED ORDER — MUPIROCIN 2 % EX OINT
1.0000 | TOPICAL_OINTMENT | Freq: Two times a day (BID) | CUTANEOUS | Status: AC
  Administered 2022-01-23 – 2022-01-27 (×10): 1 via NASAL
  Filled 2022-01-23 (×2): qty 22

## 2022-01-23 MED ORDER — 0.9 % SODIUM CHLORIDE (POUR BTL) OPTIME
TOPICAL | Status: DC | PRN
  Administered 2022-01-23: 1000 mL

## 2022-01-23 MED ORDER — METOCLOPRAMIDE HCL 5 MG/ML IJ SOLN: 5.0000 mg | Freq: Three times a day (TID) | INTRAMUSCULAR | Status: DC | PRN

## 2022-01-23 MED ORDER — ONDANSETRON HCL 4 MG/2ML IJ SOLN
INTRAMUSCULAR | Status: AC
  Filled 2022-01-23: qty 2

## 2022-01-23 MED ORDER — VANCOMYCIN HCL 1000 MG IV SOLR
INTRAVENOUS | Status: AC
  Filled 2022-01-23: qty 20

## 2022-01-23 MED ORDER — PHENYLEPHRINE HCL-NACL 20-0.9 MG/250ML-% IV SOLN
INTRAVENOUS | Status: DC | PRN
  Administered 2022-01-23: 20 ug/min via INTRAVENOUS

## 2022-01-23 MED ORDER — HYDROMORPHONE HCL 1 MG/ML IJ SOLN: 0.5000 mg | INTRAMUSCULAR | Status: DC | PRN

## 2022-01-23 MED ORDER — ACETAMINOPHEN 500 MG PO TABS
1000.0000 mg | ORAL_TABLET | Freq: Four times a day (QID) | ORAL | Status: AC
  Administered 2022-01-23 – 2022-01-24 (×3): 1000 mg via ORAL
  Filled 2022-01-23 (×3): qty 2

## 2022-01-23 MED ORDER — ONDANSETRON HCL 4 MG/2ML IJ SOLN
INTRAMUSCULAR | Status: DC | PRN
  Administered 2022-01-23: 4 mg via INTRAVENOUS

## 2022-01-23 MED ORDER — ACETAMINOPHEN 325 MG PO TABS
325.0000 mg | ORAL_TABLET | Freq: Four times a day (QID) | ORAL | Status: DC | PRN
  Administered 2022-01-26 – 2022-01-30 (×5): 650 mg via ORAL
  Filled 2022-01-23 (×7): qty 2

## 2022-01-23 MED ORDER — DEXAMETHASONE SODIUM PHOSPHATE 10 MG/ML IJ SOLN
INTRAMUSCULAR | Status: DC | PRN
  Administered 2022-01-23: 10 mg via INTRAVENOUS

## 2022-01-23 MED ORDER — OXYCODONE HCL 5 MG PO TABS
10.0000 mg | ORAL_TABLET | ORAL | Status: DC | PRN
  Administered 2022-01-24 – 2022-01-25 (×3): 10 mg via ORAL

## 2022-01-23 MED ORDER — LABETALOL HCL 5 MG/ML IV SOLN
5.0000 mg | Freq: Once | INTRAVENOUS | Status: AC
  Administered 2022-01-23: 5 mg via INTRAVENOUS

## 2022-01-23 MED ORDER — ENOXAPARIN SODIUM 30 MG/0.3ML IJ SOSY
30.0000 mg | PREFILLED_SYRINGE | INTRAMUSCULAR | Status: DC
  Administered 2022-01-23 – 2022-01-29 (×7): 30 mg via SUBCUTANEOUS
  Filled 2022-01-23 (×7): qty 0.3

## 2022-01-23 MED ORDER — OXYCODONE HCL 5 MG/5ML PO SOLN: 5.0000 mg | Freq: Once | ORAL | Status: DC | PRN

## 2022-01-23 MED ORDER — METHOCARBAMOL 1000 MG/10ML IJ SOLN: 500.0000 mg | Freq: Four times a day (QID) | INTRAVENOUS | Status: DC | PRN

## 2022-01-23 MED ORDER — ONDANSETRON HCL 4 MG PO TABS: 4.0000 mg | ORAL_TABLET | Freq: Four times a day (QID) | ORAL | Status: DC | PRN

## 2022-01-23 MED ORDER — POVIDONE-IODINE 10 % EX SWAB: 2.0000 | Freq: Once | CUTANEOUS | Status: DC

## 2022-01-23 MED ORDER — METOCLOPRAMIDE HCL 5 MG PO TABS: 5.0000 mg | ORAL_TABLET | Freq: Three times a day (TID) | ORAL | Status: DC | PRN

## 2022-01-23 MED ORDER — ONDANSETRON HCL 4 MG/2ML IJ SOLN: 4.0000 mg | Freq: Four times a day (QID) | INTRAMUSCULAR | Status: DC | PRN

## 2022-01-23 MED ORDER — ROCURONIUM BROMIDE 10 MG/ML (PF) SYRINGE
PREFILLED_SYRINGE | INTRAVENOUS | Status: DC | PRN
  Administered 2022-01-23: 50 mg via INTRAVENOUS

## 2022-01-23 MED ORDER — BUPIVACAINE-MELOXICAM ER 400-12 MG/14ML IJ SOLN
INTRAMUSCULAR | Status: AC
  Filled 2022-01-23: qty 1

## 2022-01-23 MED ORDER — PROPOFOL 10 MG/ML IV BOLUS
INTRAVENOUS | Status: AC
  Filled 2022-01-23: qty 20

## 2022-01-23 MED ORDER — TRANEXAMIC ACID-NACL 1000-0.7 MG/100ML-% IV SOLN
1000.0000 mg | INTRAVENOUS | Status: AC
  Administered 2022-01-23: 1000 mg via INTRAVENOUS
  Filled 2022-01-23: qty 100

## 2022-01-23 MED ORDER — TRANEXAMIC ACID-NACL 1000-0.7 MG/100ML-% IV SOLN
1000.0000 mg | Freq: Once | INTRAVENOUS | Status: AC
  Administered 2022-01-23: 1000 mg via INTRAVENOUS
  Filled 2022-01-23: qty 100

## 2022-01-23 MED ORDER — METHOCARBAMOL 500 MG PO TABS: 500.0000 mg | ORAL_TABLET | Freq: Four times a day (QID) | ORAL | Status: DC | PRN

## 2022-01-23 MED ORDER — ROCURONIUM BROMIDE 10 MG/ML (PF) SYRINGE
PREFILLED_SYRINGE | INTRAVENOUS | Status: AC
  Filled 2022-01-23: qty 10

## 2022-01-23 MED ORDER — DIPHENHYDRAMINE HCL 12.5 MG/5ML PO ELIX: 25.0000 mg | ORAL_SOLUTION | ORAL | Status: DC | PRN

## 2022-01-23 MED ORDER — PROPOFOL 10 MG/ML IV BOLUS
INTRAVENOUS | Status: DC | PRN
  Administered 2022-01-23: 100 mg via INTRAVENOUS
  Administered 2022-01-23: 75 ug/kg/min via INTRAVENOUS

## 2022-01-23 MED ORDER — CEFAZOLIN SODIUM-DEXTROSE 2-4 GM/100ML-% IV SOLN
2.0000 g | Freq: Four times a day (QID) | INTRAVENOUS | Status: AC
  Administered 2022-01-23 (×2): 2 g via INTRAVENOUS
  Filled 2022-01-23 (×2): qty 100

## 2022-01-23 MED ORDER — ALUM & MAG HYDROXIDE-SIMETH 200-200-20 MG/5ML PO SUSP: 30.0000 mL | ORAL | Status: DC | PRN

## 2022-01-23 MED ORDER — ORAL CARE MOUTH RINSE: 15.0000 mL | Freq: Once | OROMUCOSAL | Status: AC

## 2022-01-23 MED ORDER — BUPIVACAINE-MELOXICAM ER 400-12 MG/14ML IJ SOLN
INTRAMUSCULAR | Status: DC | PRN
  Administered 2022-01-23: 400 mg

## 2022-01-23 MED ORDER — CEFAZOLIN SODIUM-DEXTROSE 2-4 GM/100ML-% IV SOLN
2.0000 g | INTRAVENOUS | Status: AC
  Administered 2022-01-23: 2 g via INTRAVENOUS
  Filled 2022-01-23: qty 100

## 2022-01-23 MED ORDER — LACTATED RINGERS IV SOLN
INTRAVENOUS | Status: DC
Start: 2022-01-23 — End: 2022-01-23

## 2022-01-23 MED ORDER — ACETAMINOPHEN 500 MG PO TABS: 1000.0000 mg | ORAL_TABLET | Freq: Once | ORAL | Status: DC

## 2022-01-23 MED ORDER — PRONTOSAN WOUND IRRIGATION OPTIME
TOPICAL | Status: DC | PRN
  Administered 2022-01-23: 1

## 2022-01-23 MED ORDER — LIDOCAINE 2% (20 MG/ML) 5 ML SYRINGE
INTRAMUSCULAR | Status: DC | PRN
  Administered 2022-01-23: 60 mg via INTRAVENOUS

## 2022-01-23 MED ORDER — OXYCODONE HCL 5 MG PO TABS: 5.0000 mg | ORAL_TABLET | Freq: Once | ORAL | Status: DC | PRN

## 2022-01-23 MED ORDER — DEXAMETHASONE SODIUM PHOSPHATE 10 MG/ML IJ SOLN
INTRAMUSCULAR | Status: AC
  Filled 2022-01-23: qty 1

## 2022-01-23 MED ORDER — OXYCODONE HCL ER 10 MG PO T12A
10.0000 mg | EXTENDED_RELEASE_TABLET | Freq: Two times a day (BID) | ORAL | Status: DC
  Administered 2022-01-23 – 2022-01-28 (×6): 10 mg via ORAL
  Filled 2022-01-23 (×6): qty 1

## 2022-01-23 MED ORDER — FENTANYL CITRATE (PF) 250 MCG/5ML IJ SOLN
INTRAMUSCULAR | Status: DC | PRN
  Administered 2022-01-23 (×4): 25 ug via INTRAVENOUS

## 2022-01-23 MED ORDER — TRANEXAMIC ACID 1000 MG/10ML IV SOLN
2000.0000 mg | INTRAVENOUS | Status: DC
  Filled 2022-01-23: qty 20

## 2022-01-23 MED ORDER — OXYCODONE HCL 5 MG PO TABS
5.0000 mg | ORAL_TABLET | ORAL | Status: DC | PRN
  Administered 2022-01-23: 5 mg via ORAL
  Filled 2022-01-23 (×3): qty 2
  Filled 2022-01-23: qty 1

## 2022-01-23 MED ORDER — LIDOCAINE 2% (20 MG/ML) 5 ML SYRINGE
INTRAMUSCULAR | Status: AC
  Filled 2022-01-23: qty 5

## 2022-01-23 MED ORDER — PANTOPRAZOLE SODIUM 40 MG PO TBEC
40.0000 mg | DELAYED_RELEASE_TABLET | Freq: Every day | ORAL | Status: DC
  Administered 2022-01-23 – 2022-01-30 (×8): 40 mg via ORAL
  Filled 2022-01-23 (×8): qty 1

## 2022-01-23 MED ORDER — METOPROLOL TARTRATE 50 MG PO TABS
75.0000 mg | ORAL_TABLET | Freq: Two times a day (BID) | ORAL | Status: DC
  Administered 2022-01-23 – 2022-01-30 (×14): 75 mg via ORAL
  Filled 2022-01-23 (×2): qty 1
  Filled 2022-01-23 (×4): qty 3
  Filled 2022-01-23: qty 1
  Filled 2022-01-23 (×4): qty 3
  Filled 2022-01-23: qty 1
  Filled 2022-01-23: qty 3
  Filled 2022-01-23: qty 1

## 2022-01-23 MED ORDER — AMLODIPINE BESYLATE 2.5 MG PO TABS
2.5000 mg | ORAL_TABLET | Freq: Every day | ORAL | Status: DC
  Administered 2022-01-24 – 2022-01-30 (×7): 2.5 mg via ORAL
  Filled 2022-01-23 (×8): qty 1

## 2022-01-23 MED ORDER — CHLORHEXIDINE GLUCONATE 0.12 % MT SOLN
15.0000 mL | Freq: Once | OROMUCOSAL | Status: AC
  Administered 2022-01-23: 15 mL via OROMUCOSAL
  Filled 2022-01-23: qty 15

## 2022-01-23 MED ORDER — DOCUSATE SODIUM 100 MG PO CAPS
100.0000 mg | ORAL_CAPSULE | Freq: Two times a day (BID) | ORAL | Status: DC
  Administered 2022-01-23 – 2022-01-30 (×14): 100 mg via ORAL
  Filled 2022-01-23 (×14): qty 1

## 2022-01-23 MED ORDER — PHENOL 1.4 % MT LIQD: 1.0000 | OROMUCOSAL | Status: DC | PRN

## 2022-01-23 MED ORDER — VANCOMYCIN HCL 1 G IV SOLR
INTRAVENOUS | Status: DC | PRN
  Administered 2022-01-23: 1000 mg via TOPICAL

## 2022-01-23 MED ORDER — FENTANYL CITRATE (PF) 100 MCG/2ML IJ SOLN: 25.0000 ug | INTRAMUSCULAR | Status: DC | PRN

## 2022-01-23 MED ORDER — LACTATED RINGERS IV SOLN: INTRAVENOUS | Status: DC

## 2022-01-23 MED ORDER — SODIUM CHLORIDE 0.9 % IV SOLN: INTRAVENOUS | Status: DC

## 2022-01-23 MED ORDER — SUGAMMADEX SODIUM 200 MG/2ML IV SOLN
INTRAVENOUS | Status: DC | PRN
  Administered 2022-01-23: 200 mg via INTRAVENOUS

## 2022-01-23 MED ORDER — SODIUM CHLORIDE 0.9 % IR SOLN
Status: DC | PRN
  Administered 2022-01-23: 1000 mL

## 2022-01-23 MED ORDER — CHLORHEXIDINE GLUCONATE CLOTH 2 % EX PADS
6.0000 | MEDICATED_PAD | Freq: Every day | CUTANEOUS | Status: AC
  Administered 2022-01-24 – 2022-01-28 (×4): 6 via TOPICAL

## 2022-01-23 MED ORDER — LEVOTHYROXINE SODIUM 88 MCG PO TABS
44.0000 ug | ORAL_TABLET | Freq: Every day | ORAL | Status: DC
  Administered 2022-01-24 – 2022-01-30 (×7): 44 ug via ORAL
  Filled 2022-01-23 (×3): qty 0.5
  Filled 2022-01-23 (×2): qty 1
  Filled 2022-01-23 (×2): qty 0.5
  Filled 2022-01-23: qty 1

## 2022-01-23 SURGICAL SUPPLY — 67 items
BAG COUNTER SPONGE SURGICOUNT (BAG) ×1 IMPLANT
BAG DECANTER FOR FLEXI CONT (MISCELLANEOUS) ×1 IMPLANT
BAG SPNG CNTER NS LX DISP (BAG) ×1
BLADE SAW SAG 90X13X1.27 (BLADE) IMPLANT
CELLS DAT CNTRL 66122 CELL SVR (MISCELLANEOUS) IMPLANT
COVER PERINEAL POST (MISCELLANEOUS) ×1 IMPLANT
COVER SURGICAL LIGHT HANDLE (MISCELLANEOUS) ×1 IMPLANT
CUP SECTOR GRIPTON 50MM (Cup) IMPLANT
DRAPE C-ARM 42X72 X-RAY (DRAPES) ×1 IMPLANT
DRAPE POUCH INSTRU U-SHP 10X18 (DRAPES) ×1 IMPLANT
DRAPE STERI IOBAN 125X83 (DRAPES) ×1 IMPLANT
DRAPE U-SHAPE 47X51 STRL (DRAPES) ×2 IMPLANT
DRSG AQUACEL AG ADV 3.5X10 (GAUZE/BANDAGES/DRESSINGS) ×1 IMPLANT
DURAPREP 26ML APPLICATOR (WOUND CARE) ×2 IMPLANT
ELECT BLADE 4.0 EZ CLEAN MEGAD (MISCELLANEOUS) ×1
ELECT REM PT RETURN 9FT ADLT (ELECTROSURGICAL) ×1
ELECTRODE BLDE 4.0 EZ CLN MEGD (MISCELLANEOUS) ×1 IMPLANT
ELECTRODE REM PT RTRN 9FT ADLT (ELECTROSURGICAL) ×1 IMPLANT
GLOVE BIOGEL PI IND STRL 7.0 (GLOVE) ×2 IMPLANT
GLOVE BIOGEL PI IND STRL 7.5 (GLOVE) ×5 IMPLANT
GLOVE ECLIPSE 7.0 STRL STRAW (GLOVE) ×2 IMPLANT
GLOVE SKINSENSE STRL SZ7.5 (GLOVE) ×1 IMPLANT
GLOVE SURG SYN 7.5  E (GLOVE) ×2
GLOVE SURG SYN 7.5 E (GLOVE) ×2 IMPLANT
GLOVE SURG SYN 7.5 PF PI (GLOVE) ×2 IMPLANT
GLOVE SURG UNDER POLY LF SZ7 (GLOVE) ×1 IMPLANT
GLOVE SURG UNDER POLY LF SZ7.5 (GLOVE) ×2 IMPLANT
GOWN STRL REIN XL XLG (GOWN DISPOSABLE) ×1 IMPLANT
GOWN STRL REUS W/ TWL LRG LVL3 (GOWN DISPOSABLE) IMPLANT
GOWN STRL REUS W/ TWL XL LVL3 (GOWN DISPOSABLE) ×1 IMPLANT
GOWN STRL REUS W/TWL LRG LVL3 (GOWN DISPOSABLE)
GOWN STRL REUS W/TWL XL LVL3 (GOWN DISPOSABLE) ×1
HANDPIECE INTERPULSE COAX TIP (DISPOSABLE) ×1
HEAD FEM STD 32X+1 STRL (Hips) IMPLANT
HOOD PEEL AWAY FLYTE STAYCOOL (MISCELLANEOUS) ×2 IMPLANT
IV NS IRRIG 3000ML ARTHROMATIC (IV SOLUTION) ×1 IMPLANT
JET LAVAGE IRRISEPT WOUND (IRRIGATION / IRRIGATOR) ×1
KIT BASIN OR (CUSTOM PROCEDURE TRAY) ×1 IMPLANT
LAVAGE JET IRRISEPT WOUND (IRRIGATION / IRRIGATOR) ×1 IMPLANT
LINER ACET PNNCL PLUS4 NEUTRAL (Hips) IMPLANT
MARKER SKIN DUAL TIP RULER LAB (MISCELLANEOUS) ×1 IMPLANT
NDL SPNL 18GX3.5 QUINCKE PK (NEEDLE) ×1 IMPLANT
NEEDLE SPNL 18GX3.5 QUINCKE PK (NEEDLE) ×1 IMPLANT
PACK TOTAL JOINT (CUSTOM PROCEDURE TRAY) ×1 IMPLANT
PACK UNIVERSAL I (CUSTOM PROCEDURE TRAY) ×1 IMPLANT
PINNACLE PLUS 4 NEUTRAL (Hips) ×1 IMPLANT
RETRACTOR WND ALEXIS 18 MED (MISCELLANEOUS) IMPLANT
RTRCTR WOUND ALEXIS 18CM MED (MISCELLANEOUS)
SAW OSC TIP CART 19.5X105X1.3 (SAW) ×1 IMPLANT
SCREW 6.5MMX25MM (Screw) IMPLANT
SET HNDPC FAN SPRY TIP SCT (DISPOSABLE) ×1 IMPLANT
STAPLER VISISTAT 35W (STAPLE) IMPLANT
STEM FEM ACTIS STD SZ4 (Stem) IMPLANT
SUT ETHIBOND 2 V 37 (SUTURE) ×1 IMPLANT
SUT ETHILON 2 0 PSLX (SUTURE) IMPLANT
SUT VIC AB 0 CT1 27 (SUTURE) ×1
SUT VIC AB 0 CT1 27XBRD ANBCTR (SUTURE) ×1 IMPLANT
SUT VIC AB 1 CTX 36 (SUTURE) ×1
SUT VIC AB 1 CTX36XBRD ANBCTR (SUTURE) ×1 IMPLANT
SUT VIC AB 2-0 CT1 27 (SUTURE) ×2
SUT VIC AB 2-0 CT1 TAPERPNT 27 (SUTURE) ×2 IMPLANT
SUT VIC AB CT1 27XBRD ANBCTRL (SUTURE) ×1
SYR 50ML LL SCALE MARK (SYRINGE) ×1 IMPLANT
TOWEL GREEN STERILE (TOWEL DISPOSABLE) ×1 IMPLANT
TRAY FOLEY W/BAG SLVR 16FR (SET/KITS/TRAYS/PACK) ×1
TRAY FOLEY W/BAG SLVR 16FR ST (SET/KITS/TRAYS/PACK) ×1 IMPLANT
YANKAUER SUCT BULB TIP NO VENT (SUCTIONS) ×1 IMPLANT

## 2022-01-23 NOTE — Evaluation (Signed)
Physical Therapy Evaluation Patient Details Name: [name] MRN: [id number] DOB: [birth year] Today's Date: 01/23/2022  History of Present Illness  Pt is an 81 y/o female s/p L THA, direct anterior. PMH includes AAA, CAD s/p CABG, CKD, COPD, HTN, and polymyalgia Rheumatica.  Clinical Impression  Pt admitted secondary to problem above with deficits below. Pt requiring min A to stand. After initial stand, pt becoming lightheaded and had to return to supine. Attempted to use bed pan, but pt requesting to try [facility]. Transferred to [facility] with min A +2, however, pt becoming light headed, so returned to bed. Anticipate pt will progress well once symptoms improve. Will continue to follow acutely.        Recommendations for follow up therapy are one component of a multi-disciplinary discharge planning process, led by the attending physician.  Recommendations may be updated based on patient status, additional functional criteria and insurance authorization.  Follow Up Recommendations Follow physician's recommendations for discharge plan and follow up therapies      Assistance Recommended at Discharge Intermittent Supervision/Assistance  Patient can return home with the following  A little help with walking and/or transfers;A little help with bathing/dressing/bathroom;Assistance with cooking/housework;Help with stairs or ramp for entrance;Assist for transportation    Equipment Recommendations None recommended by PT  Recommendations for Other Services       Functional Status Assessment Patient has had a recent decline in their functional status and demonstrates the ability to make significant improvements in function in a reasonable and predictable amount of time.     Precautions / Restrictions Precautions Precautions: Fall Restrictions Weight Bearing Restrictions: Yes LLE Weight Bearing: Weight bearing as tolerated      Mobility  Bed Mobility Overal bed mobility: Needs Assistance Bed  Mobility: Supine to Sit, Sit to Supine     Supine to sit: Min assist Sit to supine: Min assist   General bed mobility comments: Assist for LLE. Performed X2 secondary to dizziness. Required supine rest following initial stand.    Transfers Overall transfer level: Needs assistance Equipment used: Rolling walker (2 wheels) Transfers: Sit to/from Stand, Bed to chair/wheelchair/BSC Sit to Stand: Min assist Stand pivot transfers: Min assist, +2 safety/equipment         General transfer comment: Pt stood with min A and became very lightheaded so return to supine. Once in supine, pt requesting to go to bathroom and unable to use bed pan, so RN present to attempt transfer to/from [facility]. Following transfer to [facility], pt becoming lightheaded, so returned to bed.    Ambulation/Gait                  Stairs            Wheelchair Mobility    Modified Rankin (Stroke Patients Only)       Balance Overall balance assessment: Needs assistance Sitting-balance support: No upper extremity supported, Feet supported Sitting balance-Leahy Scale: Fair     Standing balance support: Bilateral upper extremity supported, Reliant on assistive device for balance Standing balance-Leahy Scale: Poor                               Pertinent Vitals/Pain Pain Assessment Pain Assessment: Faces Faces Pain Scale: Hurts even more Pain Location: L hip Pain Descriptors / Indicators: Grimacing, Guarding Pain Intervention(s): Limited activity within patient's tolerance, Monitored during session, Repositioned    Home Living Family/patient expects to be discharged to:: Private residence Living  Arrangements: Spouse/significant other Available Help at Discharge: Family Type of Home: Independent living facility ([facility]) Home Access: Elevator       Home Layout: One level Home Equipment: Conservation officer, nature (2 wheels);Rollator (4 wheels);Cane - single point;Shower seat;Grab bars -  tub/shower;Grab bars - toilet      Prior Function Prior Level of Function : Independent/Modified Independent             Mobility Comments: Uses cane inside of home and rollator when outside of home       Hand Dominance        Extremity/Trunk Assessment   Upper Extremity Assessment Upper Extremity Assessment: Overall WFL for tasks assessed    Lower Extremity Assessment Lower Extremity Assessment: LLE deficits/detail LLE Deficits / Details: Deficits consistent with post op pain and weakness.    Cervical / Trunk Assessment Cervical / Trunk Assessment: Normal  Communication   Communication: No difficulties  Cognition Arousal/Alertness: Awake/alert Behavior During Therapy: WFL for tasks assessed/performed Overall Cognitive Status: Within Functional Limits for tasks assessed                                          General Comments      Exercises     Assessment/Plan    PT Assessment Patient needs continued PT services  PT Problem List Decreased strength;Decreased range of motion;Decreased activity tolerance;Decreased balance;Decreased mobility;Decreased knowledge of use of DME;Decreased knowledge of precautions;Pain       PT Treatment Interventions DME instruction;Gait training;Functional mobility training;Therapeutic activities;Therapeutic exercise;Balance training;Patient/family education    PT Goals (Current goals can be found in the Care Plan section)  Acute Rehab PT Goals Patient Stated Goal: to go home PT Goal Formulation: With patient Time For Goal Achievement: 02/06/22 Potential to Achieve Goals: Good    Frequency 7X/week     Co-evaluation               AM-PAC PT "6 Clicks" Mobility  Outcome Measure Help needed turning from your back to your side while in a flat bed without using bedrails?: A Little Help needed moving from lying on your back to sitting on the side of a flat bed without using bedrails?: A Little Help  needed moving to and from a bed to a chair (including a wheelchair)?: A Little Help needed standing up from a chair using your arms (e.g., wheelchair or bedside chair)?: A Little Help needed to walk in hospital room?: A Little Help needed climbing 3-5 steps with a railing? : A Lot 6 Click Score: 17    End of Session Equipment Utilized During Treatment: Gait belt Activity Tolerance: Treatment limited secondary to medical complications (Comment) (lightheadedness) Patient left: in bed;with call bell/phone within reach;with nursing/sitter in room Nurse Communication: Mobility status PT Visit Diagnosis: Other abnormalities of gait and mobility (R26.89);Muscle weakness (generalized) (M62.81);Difficulty in walking, not elsewhere classified (R26.2);Pain Pain - Right/Left: Left Pain - part of body: Hip    Time: 1017-5102 PT Time Calculation (min) (ACUTE ONLY): 39 min   Charges:   PT Evaluation $PT Eval Low Complexity: 1 Low PT Treatments $Therapeutic Activity: 23-37 mins        Lou Miner, DPT  Acute Rehabilitation Services  Office: [phone]   Rudean Hitt 01/23/2022, 4:52 PM

## 2022-01-23 NOTE — Op Note (Signed)
LEFT TOTAL HIP ARTHROPLASTY ANTERIOR APPROACH  Procedure Note [name]   [id number]  Pre-op Diagnosis: left hip degenerative joint disease     Post-op Diagnosis: same  Operative Findings Complete loss of articular cartilage Severely torn labrum Joint effusion and synovitis Mild superior acetabular wear   Operative Procedures  1. Total hip replacement; Left hip; uncemented [id number]   Surgeon: Frankey Shown, M.D.  Assist: Madalyn Rob, PA-C   Anesthesia: general  Prosthesis: Depuy Acetabulum: Pinnacle 50 mm Femur: Actis 4 STD Head: 32 mm size: +1 Liner: +4 Bearing Type: metal/poly  Total Hip Arthroplasty (Anterior Approach) Op Note:  After informed consent was obtained and the operative extremity marked in the holding area, the patient was brought back to the operating room and placed supine on the HANA table. Next, the operative extremity was prepped and draped in normal sterile fashion. Surgical timeout occurred verifying patient identification, surgical site, surgical procedure and administration of antibiotics.  A modified anterior Smith-Peterson approach to the hip was performed, using the interval between tensor fascia lata and sartorius.  Dissection was carried bluntly down onto the anterior hip capsule. The lateral femoral circumflex vessels were identified and coagulated. A capsulotomy was performed and the capsular flaps tagged for later repair.  The neck osteotomy was performed. The femoral head was removed which showed severe wear, the acetabular rim was cleared of soft tissue and attention was turned to reaming the acetabulum.  Sequential reaming was performed under fluoroscopic guidance. We reamed to a size 49 mm, and then impacted the acetabular shell.  Due to superior wear, I needed to ream slightly higher in order to find good bone stock.  A 25 mm cancellous screw was placed through the shell for added fixation.  The liner was then placed after  irrigation and attention turned to the femur.  After placing the femoral hook, the leg was taken to externally rotated, extended and adducted position taking care to perform soft tissue releases to allow for adequate mobilization of the femur. Soft tissue was cleared from the shoulder of the greater trochanter and the hook elevator used to improve exposure of the proximal femur. Sequential broaching performed up to a size 4. Trial neck and head were placed. The leg was brought back up to neutral and the construct reduced.  Antibiotic irrigation was placed in the surgical wound.  The position and sizing of components, offset and leg lengths were checked using fluoroscopy. Stability of the construct was checked in extension and external rotation without any subluxation or impingement of prosthesis. We dislocated the prosthesis, dropped the leg back into position, removed trial components, and irrigated copiously. The final stem and head was then placed, the leg brought back up, the system reduced and fluoroscopy used to verify positioning.  We irrigated, obtained hemostasis and closed the capsule using #2 ethibond suture.  One gram of vancomycin powder was placed in the surgical bed.   One gram of topical tranexamic acid was injected into the joint.  The fascia was closed with #1 vicryl plus, the deep fat layer was closed with 0 vicryl, the subcutaneous layers closed with 2.0 Vicryl Plus and the skin closed with 2.0 nylon and dermabond. A sterile dressing was applied. The patient was awakened in the operating room and taken to recovery in stable condition.  All sponge, needle, and instrument counts were correct at the end of the case.   Tawanna Cooler, my PA, was a medical necessity for opening, closing, limb positioning,  retracting, exposing, and overall facilitation and timely completion of the surgery.  Position: supine  Complications: see description of procedure.  Time Out: performed    Drains/Packing: none  Estimated blood loss: see anesthesia record  Returned to Recovery Room: in good condition.   Antibiotics: yes   Mechanical VTE (DVT) Prophylaxis: sequential compression devices, TED thigh-high  Chemical VTE (DVT) Prophylaxis: lovenox POD 1   Fluid Replacement: see anesthesia record  Specimens Removed: 1 to pathology   Sponge and Instrument Count Correct? yes   PACU: portable radiograph - low AP   Plan/RTC: Return in 2 weeks for staple removal. Weight Bearing/Load Lower Extremity: full  Hip precautions: none Suture Removal: 2 weeks   N. Eduard Roux, MD [facility] 8:55 AM   Implant Name Type Inv. Item Serial No. Manufacturer Lot No. LRB No. Used Action  CUP SECTOR GRIPTON 50MM - [id number] Cup CUP SECTOR GRIPTON 50MM  [employer] [id number] Left 1 Implanted  PINNACLE PLUS 4 NEUTRAL - [id number] Hips PINNACLE PLUS 4 NEUTRAL  [employer] [id number] Left 1 Implanted  SCREW 6.5MMX25MM - [id number] Screw SCREW 6.5MMX25MM  [employer] [id number] Left 1 Implanted  STEM FEM ACTIS STD SZ4 - [id number] Stem STEM FEM ACTIS STD SZ4  [employer] [id number] Left 1 Implanted  HEAD FEM STD 32X+1 STRL - [id number] Hips HEAD FEM STD 32X+1 STRL  [employer] [id number] Left 1 Implanted

## 2022-01-23 NOTE — H&P (Signed)
PREOPERATIVE H&P  Chief Complaint: left hip degenerative joint disease  HPI: [name] is a 81 y.o. female who presents for surgical treatment of left hip degenerative joint disease.  She denies any changes in medical history.  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 Most likely scar. Mild subendocardial ischemia. EF 71% LOW RISK.    Cervical disc disease    fracture   Chronic kidney disease    COPD (chronic obstructive pulmonary disease) (HCC)    Diverticulosis    Hyperlipidemia    Hypertension    Hypothyroidism (acquired)    hypo   Myocardial infarction ([place]) 11/2014   TIA   Polymyalgia rheumatica ([place])    2011 Dr. Charlestine Night   Rheumatoid arthritis [facility]) 2011   Dr.Truslow; fracture knees, hands and wrists -    S/P CABG x 1 1996   CABG--LIMA-LAD for ostial LAD (not felt to be PCI amenable). EF NORMAL then; LIMA now atretic   Shortness of breath dyspnea    with exertion   Stroke ([place]) 11/2014   TIA    Urinary frequency    Past Surgical History:  Procedure Laterality Date   ABDOMINAL AORTIC ANEURYSM REPAIR  8657   Complicated by mesenteric artery stenosis and splenic artery infarction with acquired Asplenia   APPENDECTOMY     BUNIONECTOMY  07/2011   right foot   CARDIAC CATHETERIZATION  2005   (Most recent CATH) - ostial LAD lesion 20-30% (down from 90% initially). Atretic LIMA. Minimal disease the RCA and Circumflex system.   CARPAL TUNNEL RELEASE  Left    CATARACT EXTRACTION Bilateral    CERVICAL SPINE SURGERY     plate 2008 Dr. Saintclair Halsted   COLONOSCOPY     CORONARY ARTERY BYPASS GRAFT  1996   INCLUDED AN INTERNAL MAMMARY ARTERY TO THE LAD. EF WAS NORMAL   INGUINAL HERNIA REPAIR Right    LAPAROSCOPIC APPENDECTOMY N/A 06/28/2016   Procedure: APPENDECTOMY LAPAROSCOPIC;  Surgeon: Leighton Ruff, MD;  Location: [facility];  Service: General;  Laterality: N/A;   NM MYOVIEW LTD  06/2010   Fixed anteroseptal, apical and inferoapical defect with moderate size. Most likely scar. Mild subendocardial ischemia. EF 71% LOW RISK.    SPLENECTOMY     TRANSTHORACIC ECHOCARDIOGRAM  12/2014   [facility]: Normal LV size & function. EF 55-60%,    vagina polyp     VIDEO ASSISTED THORACOSCOPY (VATS)/WEDGE RESECTION Left 03/02/2015   Procedure: VIDEO ASSISTED THORACOSCOPY (VATS), MINI THORACOTOMY, LEFT UPPER LOBE WEDGE, TAKE DOWN OF INTERNAL MAMMARY LESIONS, PLACEMENT OF ON-Q PUMP;  Surgeon: Grace Isaac, MD;  Location: [place];  Service: Thoracic;  Laterality: Left;   VIDEO BRONCHOSCOPY N/A 03/02/2015   Procedure: BRONCHOSCOPY;  Surgeon: Grace Isaac, MD;  Location: [place];  Service: Thoracic;  Laterality: N/A;   VIDEO BRONCHOSCOPY WITH ENDOBRONCHIAL NAVIGATION N/A 10/08/2017   Procedure: VIDEO  BRONCHOSCOPY WITH ENDOBRONCHIAL NAVIGATION WITH BIOPSIES OF LEFT UPPER LOBE AND LEFT LOWER LOBE;  Surgeon: Grace Isaac, MD;  Location: [place];  Service: Thoracic;  Laterality: N/A;   Social History   Socioeconomic History   Marital status: Married    Spouse name: Not on file   Number of children: 4   Years of education: Not on file   Highest education level: Not on file  Occupational History   Occupation: retired    Fish farm manager: RETIRED  Tobacco Use   Smoking status: Former    Types: Cigarettes    Quit date: 12/04/1958    Years since quitting: 63.1    Passive exposure: Never   Smokeless tobacco: Never  Vaping Use   Vaping Use: Never used   Substance and Sexual Activity   Alcohol use: Yes    Comment: hardly ever   Drug use: No   Sexual activity: Not Currently  Other Topics Concern   Not on file  Social History Narrative   [facility]  [place] ; [place].    Living at [place] since dec 2015   Social Determinants of Health   Financial Resource Strain: Low Risk  (05/30/2021)   Overall Financial Resource Strain (CARDIA)    Difficulty of Paying Living Expenses: Not hard at all  Food Insecurity: No Food Insecurity (05/30/2021)   Hunger Vital Sign    Worried About Running Out of Food in the Last Year: Never true    Ran Out of Food in the Last Year: Never true  Transportation Needs: No Transportation Needs (05/30/2021)   PRAPARE - Hydrologist (Medical): No    Lack of Transportation (Non-Medical): No  Physical Activity: Insufficiently Active (05/30/2021)   Exercise Vital Sign    Days of Exercise per Week: 3 days    Minutes of Exercise per Session: 30 min  Stress: No Stress Concern Present (05/30/2021)   [place]    Feeling of Stress : Not at all  Social Connections: Moderately Integrated (05/30/2021)   Social Connection and Isolation Panel [NHANES]    Frequency of Communication with Friends and Family: Twice a week    Frequency of Social Gatherings with Friends and Family: Twice a week    Attends Religious Services: Never    Marine scientist or Organizations: Yes    Attends Music therapist: More than 4 times per year    Marital Status: Married   Family History  Problem Relation Age of Onset   Hypertension Mother    Diabetes Mother    Heart disease Mother    Hyperlipidemia Mother    Stroke Father    Hyperlipidemia Sister    Hypertension Sister    Hypertension Daughter    Cancer Paternal Uncle        Deceased from cancer not sure of site    Hypertension Son    Hyperlipidemia Son    Hyperlipidemia Son    Hypertension Son    Hyperlipidemia Son    Hypertension Son    Hypertension Other    Coronary artery disease Other    Asthma Neg Hx    Colon cancer Neg Hx    Allergies  Allergen Reactions   Nsaids Other (See Comments)    Pt states all NSAIDS does not sit well onstomach   Aspirin Other (See Comments)    Hurts stomach  Codeine Nausea And Vomiting   Nitroglycerin Other (See Comments)    Heart rate drops   Prior to Admission medications   Medication Sig Start Date End Date Taking? Authorizing Provider  acetaminophen (TYLENOL) 500 MG tablet Take 1,000 mg by mouth every 8 (eight) hours as needed for moderate pain.   Yes [provider]  albuterol (VENTOLIN HFA) 108 (90 Base) MCG/ACT inhaler Inhale 2 puffs into the lungs every 4 (four) hours as needed for wheezing or shortness of breath. 11/07/21  Yes Tanda Rockers, MD  amLODipine (NORVASC) 2.5 MG tablet Take 1 tablet (2.5 mg total) by mouth daily. 09/29/21  Yes Plotnikov, [name], MD  atorvastatin (LIPITOR) 80 MG tablet TAKE 1 TABLET EVERY DAY Patient taking differently: Take 80 mg by mouth daily. 10/21/21  Yes Plotnikov, [name], MD  Cholecalciferol (VITAMIN D3) 50 MCG (2000 UT) capsule Take 1 capsule (2,000 Units total) by mouth daily. 11/13/18  Yes Plotnikov, [name], MD  Cyanocobalamin (B-12) 2500 MCG TABS one po qd Patient taking differently: Take 2,500 mcg by mouth daily. 10/07/19  Yes Plotnikov, [name], MD  famotidine (PEPCID) 20 MG tablet TAKE 1 TABLET AT BEDTIME Patient taking differently: Take 20 mg by mouth at bedtime. 07/25/21  Yes Plotnikov, [name], MD  gabapentin (NEURONTIN) 300 MG capsule TAKE 1 CAPSULE BY MOUTH THREE TIMES A DAY Patient taking differently: Take 300 mg by mouth 2 (two) times daily. 06/02/21  Yes Tanda Rockers, MD  Golimumab ([place] Blanco) every 2 (two) months. Every other month per Dr Trudie Reed   Yes [provider]   hydrochlorothiazide (HYDRODIURIL) 12.5 MG tablet TAKE 1 TABLET BY MOUTH EVERY DAY Patient taking differently: Take 12.5 mg by mouth daily. 04/08/21  Yes Tanda Rockers, MD  leflunomide (ARAVA) 20 MG tablet Take 20 mg by mouth daily.    Yes [provider]  levothyroxine (SYNTHROID) 88 MCG tablet TAKE 1 TABLET (88 MCG TOTAL) BY MOUTH DAILY BEFORE BREAKFAST. Patient taking differently: Take 44 mcg by mouth daily before breakfast. 01/09/22  Yes Plotnikov, [name], MD  loperamide (IMODIUM A-D) 2 MG tablet Take 2 mg by mouth 4 (four) times daily as needed for diarrhea or loose stools.   Yes [provider]  metoprolol tartrate (LOPRESSOR) 50 MG tablet Take 1.5 tablets (75 mg total) by mouth 2 (two) times daily. 12/12/21  Yes Plotnikov, [name], MD  pantoprazole (PROTONIX) 40 MG tablet TAKE 1 TABLET DAILY. TAKE 30 TO 60 MINUTES BEFORE FIRST MEAL OF THE DAY Patient taking differently: Take 40 mg by mouth daily. 07/25/21  Yes Plotnikov, [name], MD  Tiotropium Bromide-Olodaterol (STIOLTO RESPIMAT) 2.5-2.5 MCG/ACT AERS INHALE 2 PUFFS BY MOUTH INTO THE LUNGS DAILY 01/18/21  Yes Tanda Rockers, MD  triamcinolone (NASACORT) 55 MCG/ACT AERO nasal inhaler Place 1 spray into the nose at bedtime.   Yes [provider]  docusate sodium (COLACE) 100 MG capsule Take 1 capsule (100 mg total) by mouth daily as needed. 01/16/22 01/16/23  Aundra Dubin, PA-C  enoxaparin (LOVENOX) 40 MG/0.4ML injection Inject 0.4 mLs (40 mg total) into the skin daily for 14 days. 01/16/22 01/30/22  Aundra Dubin, PA-C  methocarbamol (ROBAXIN) 500 MG tablet Take 1 tablet (500 mg total) by mouth 2 (two) times daily as needed. 01/16/22   Aundra Dubin, PA-C  Multiple Vitamins-Minerals (CENTRUM ADULT PO) Take by mouth. 1 Tablet Daily.    [provider]  ondansetron (ZOFRAN) 4 MG tablet Take 1  tablet (4 mg total) by mouth every 8 (eight) hours as needed for nausea or vomiting. 01/16/22   Aundra Dubin, PA-C  oxyCODONE-acetaminophen (PERCOCET) 5-325 MG tablet Take 1-2 tablets by mouth every 6 (six) hours as needed. To be taken after surgery 01/16/22   Aundra Dubin, PA-C     Positive ROS: All other systems have been reviewed and were otherwise negative with the exception of those mentioned in the HPI and as above.  Physical Exam: General: Alert, no acute distress Cardiovascular: No pedal edema Respiratory: No cyanosis, no use of accessory musculature GI: abdomen soft Skin: No lesions in the area of chief complaint Neurologic: Sensation intact distally Psychiatric: Patient is competent for consent with normal mood and affect Lymphatic: no lymphedema  MUSCULOSKELETAL: exam stable  Assessment: left hip degenerative joint disease  Plan: Plan for Procedure(s): LEFT TOTAL HIP ARTHROPLASTY ANTERIOR APPROACH  The risks benefits and alternatives were discussed with the patient including but not limited to the risks of nonoperative treatment, versus surgical intervention including infection, bleeding, nerve injury,  blood clots, cardiopulmonary complications, morbidity, mortality, among others, and they were willing to proceed.   Eduard Roux, MD 01/23/2022 5:55 AM

## 2022-01-23 NOTE — Anesthesia Procedure Notes (Signed)
Procedure Name: Intubation Date/Time: 01/23/2022 7:39 AM  Performed by: Terrence Dupont, CRNAPre-anesthesia Checklist: Patient identified, Emergency Drugs available, Suction available and Patient being monitored Patient Re-evaluated:Patient Re-evaluated prior to induction Oxygen Delivery Method: Circle system utilized Preoxygenation: Pre-oxygenation with 100% oxygen Induction Type: IV induction Ventilation: Mask ventilation without difficulty Laryngoscope Size: Miller and 2 Grade View: Grade I Tube type: Oral Tube size: 7.0 mm Number of attempts: 2 (First attemp by SRNA with MAC 3.) Airway Equipment and Method: Stylet and Oral airway Placement Confirmation: ETT inserted through vocal cords under direct vision, positive ETCO2 and breath sounds checked- equal and bilateral Secured at: 22 cm Tube secured with: Tape Dental Injury: Teeth and Oropharynx as per pre-operative assessment  Comments: First attempt by SRNA, airway secured by MD. Atraumatic intubation - teeth, lips, soft tissue intact.

## 2022-01-23 NOTE — Anesthesia Postprocedure Evaluation (Signed)
Anesthesia Post Note  Patient: [name]  Procedure(s) Performed: LEFT TOTAL HIP ARTHROPLASTY ANTERIOR APPROACH (Left: Hip)     Patient location during evaluation: PACU Anesthesia Type: General Level of consciousness: awake and alert Pain management: pain level controlled Vital Signs Assessment: post-procedure vital signs reviewed and stable Respiratory status: spontaneous breathing, nonlabored ventilation and respiratory function stable Cardiovascular status: blood pressure returned to baseline Postop Assessment: no apparent nausea or vomiting Anesthetic complications: no   No notable events documented.  Last Vitals:  Vitals:   01/23/22 1015 01/23/22 1041  BP: (!) 192/68 (!) 120/46  Pulse: (!) 58 (!) 55  Resp: 18 16  Temp: 37.2 C 36.6 C  SpO2: 98% 95%    Last Pain:  Vitals:   01/23/22 1015  PainSc: 0-No pain                 Marthenia Rolling

## 2022-01-23 NOTE — Discharge Instructions (Signed)

## 2022-01-23 NOTE — Transfer of Care (Signed)
Immediate Anesthesia Transfer of Care Note  Patient: [name]  Procedure(s) Performed: LEFT TOTAL HIP ARTHROPLASTY ANTERIOR APPROACH (Left: Hip)  Patient Location: PACU  Anesthesia Type:General  Level of Consciousness: drowsy and patient cooperative  Airway & Oxygen Therapy: Patient Spontanous Breathing and Patient connected to face mask oxygen  Post-op Assessment: Report given to RN and Post -op Vital signs reviewed and stable  Post vital signs: Reviewed and stable  Last Vitals:  Vitals Value Taken Time  BP 194/73 01/23/22 0925  Temp    Pulse 58 01/23/22 0928  Resp 22 01/23/22 0928  SpO2 100 % 01/23/22 0928  Vitals shown include unvalidated device data.  Last Pain: There were no vitals filed for this visit.       Complications: No notable events documented.

## 2022-01-24 ENCOUNTER — Encounter (HOSPITAL_COMMUNITY): Payer: Self-pay | Admitting: Orthopaedic Surgery

## 2022-01-24 ENCOUNTER — Encounter: Payer: Medicare PPO | Admitting: Orthopaedic Surgery

## 2022-01-24 LAB — CBC
HCT: 33 % — ABNORMAL LOW (ref 36.0–46.0)
Hemoglobin: 11 g/dL — ABNORMAL LOW (ref 12.0–15.0)
MCH: 32.4 pg (ref 26.0–34.0)
MCHC: 33.3 g/dL (ref 30.0–36.0)
MCV: 97.3 fL (ref 80.0–100.0)
Platelets: 293 10*3/uL (ref 150–400)
RBC: 3.39 MIL/uL — ABNORMAL LOW (ref 3.87–5.11)
RDW: 15.2 % (ref 11.5–15.5)
WBC: 13.5 10*3/uL — ABNORMAL HIGH (ref 4.0–10.5)
nRBC: 0.6 % — ABNORMAL HIGH (ref 0.0–0.2)

## 2022-01-24 NOTE — Care Plan (Signed)
Ortho Bundle Case Management Note  Patient Details  Name: [name] MRN: [id number] Date of Birth: 1941-03-07                  [facility] RNCM call to patient to discuss her upcoming Left total hip arthroplasty with Dr. Erlinda Hong on 01/23/22. She is an Ortho bundle patient through [facility] and is agreeable to case management. She lives with her spouse who will be assisting after surgery. She has a RW and cane, but does not have a 3in1/BSC. Anticipate HHPT will be needed after short hospital stay. Referral made to [facility] after choice provided. Reviewed all post op care instructions. Will continue to follow for needs.   DME Arranged:   (Patient reports she has all DME at home: RW and 3in1/BSC) DME Agency:     HH Arranged:  PT HH Agency:  [facility]  Additional Comments: Please contact me with any questions of if this plan should need to change.  Jamse Arn, RN, BSN, [employer]  [phone] 01/24/2022, 8:32 AM

## 2022-01-24 NOTE — Progress Notes (Signed)
Physical Therapy Treatment Patient Details Name: [name] MRN: [id number] DOB: [birth year] Today's Date: 01/24/2022   History of Present Illness Pt is an 81 y/o female s/p L THA, direct anterior. PMH includes AAA, CAD s/p CABG, CKD, COPD, HTN, and polymyalgia Rheumatica.    PT Comments    Pt progressing well, ambulatory in hallway and performing THA exercises well. Handout provided. Will continue to follow.      Recommendations for follow up therapy are one component of a multi-disciplinary discharge planning process, led by the attending physician.  Recommendations may be updated based on patient status, additional functional criteria and insurance authorization.  Follow Up Recommendations  Follow physician's recommendations for discharge plan and follow up therapies     Assistance Recommended at Discharge Intermittent Supervision/Assistance  Patient can return home with the following A little help with walking and/or transfers;A little help with bathing/dressing/bathroom;Assistance with cooking/housework;Help with stairs or ramp for entrance;Assist for transportation   Equipment Recommendations  None recommended by PT    Recommendations for Other Services       Precautions / Restrictions Precautions Precautions: Fall Restrictions Weight Bearing Restrictions: Yes LLE Weight Bearing: Weight bearing as tolerated     Mobility  Bed Mobility Overal bed mobility: Needs Assistance Bed Mobility: Supine to Sit, Sit to Supine     Supine to sit: Min guard Sit to supine: Min assist   General bed mobility comments: assist for LE lifting into bed    Transfers Overall transfer level: Needs assistance Equipment used: Rolling walker (2 wheels) Transfers: Sit to/from Stand Sit to Stand: Min guard           General transfer comment: close guard for safety, slow to rise and steady.    Ambulation/Gait Ambulation/Gait assistance: Supervision Gait Distance (Feet): 100  Feet Assistive device: Rolling walker (2 wheels) Gait Pattern/deviations: Step-through pattern, Decreased stride length, Trunk flexed, Antalgic Gait velocity: decr     General Gait Details: cues for upright posture, increasing antalgic gait with further distance   Stairs             Wheelchair Mobility    Modified Rankin (Stroke Patients Only)       Balance Overall balance assessment: Needs assistance Sitting-balance support: No upper extremity supported, Feet supported Sitting balance-Leahy Scale: Fair     Standing balance support: Bilateral upper extremity supported, Reliant on assistive device for balance Standing balance-Leahy Scale: Poor                              Cognition Arousal/Alertness: Awake/alert Behavior During Therapy: WFL for tasks assessed/performed Overall Cognitive Status: Within Functional Limits for tasks assessed                                          Exercises Total Joint Exercises Ankle Circles/Pumps: AROM, Both, 20 reps, Seated Quad Sets: AROM, Left, 5 reps, Seated Heel Slides: AROM, Left, 5 reps, Supine Hip ABduction/ADduction: AAROM, Left, 5 reps, Supine Long Arc Quad: AROM, Both, 10 reps, Seated    General Comments        Pertinent Vitals/Pain Pain Assessment Pain Assessment: Faces Faces Pain Scale: Hurts a little bit Pain Location: L hip Pain Descriptors / Indicators: Grimacing, Guarding Pain Intervention(s): Limited activity within patient's tolerance, Monitored during session, Repositioned    Home Living  Prior Function            PT Goals (current goals can now be found in the care plan section) Acute Rehab PT Goals Patient Stated Goal: to go home PT Goal Formulation: With patient Time For Goal Achievement: 02/06/22 Potential to Achieve Goals: Good Progress towards PT goals: Progressing toward goals    Frequency    7X/week      PT Plan  Current plan remains appropriate    Co-evaluation              AM-PAC PT "6 Clicks" Mobility   Outcome Measure  Help needed turning from your back to your side while in a flat bed without using bedrails?: A Little Help needed moving from lying on your back to sitting on the side of a flat bed without using bedrails?: A Little Help needed moving to and from a bed to a chair (including a wheelchair)?: A Little Help needed standing up from a chair using your arms (e.g., wheelchair or bedside chair)?: A Little Help needed to walk in hospital room?: A Little Help needed climbing 3-5 steps with a railing? : A Little 6 Click Score: 18    End of Session   Activity Tolerance: Patient limited by fatigue Patient left: in bed;with call bell/phone within reach;with nursing/sitter in room Nurse Communication: Mobility status PT Visit Diagnosis: Other abnormalities of gait and mobility (R26.89);Muscle weakness (generalized) (M62.81);Difficulty in walking, not elsewhere classified (R26.2);Pain Pain - Right/Left: Left Pain - part of body: Hip     Time: 2956-2130 PT Time Calculation (min) (ACUTE ONLY): 21 min  Charges:  $Gait Training: 8-22 mins                     Stacie Glaze, PT DPT Acute Rehabilitation Services Pager [phone]  Office [phone]    [place] 01/24/2022, 5:25 PM

## 2022-01-24 NOTE — Progress Notes (Signed)
Subjective: 1 Day Post-Op Procedure(s) (LRB): LEFT TOTAL HIP ARTHROPLASTY ANTERIOR APPROACH (Left) Patient reports pain as mild.    Objective: Vital signs in last 24 hours: Temp:  [97.5 F (36.4 C)-99 F (37.2 C)] 97.8 F (36.6 C) (09/26 0500) Pulse Rate:  [55-66] 59 (09/26 0500) Resp:  [15-20] 18 (09/26 0500) BP: (120-204)/(45-86) 151/58 (09/26 0500) SpO2:  [91 %-100 %] 95 % (09/26 0500)  Intake/Output from previous day: 09/25 0701 - 09/26 0700 In: 2040 [P.O.:240; I.V.:1650; IV Piggyback:100] Out: 8099 [Urine:1350; Blood:200] Intake/Output this shift: Total I/O In: 1240 [P.O.:240; I.V.:1000] Out: 550 [Urine:550]  Recent Labs    01/23/22 0644  HGB 12.2   Recent Labs    01/23/22 0644  WBC 9.5  RBC 3.73*  HCT 36.3  PLT 323   Recent Labs    01/23/22 0644  NA 134*  K 4.3  CL 97*  CO2 26  BUN 65*  CREATININE 2.71*  GLUCOSE 90  CALCIUM 9.8   No results for input(s): "LABPT", "INR" in the last 72 hours.  Neurologically intact Neurovascular intact Sensation intact distally Intact pulses distally Dorsiflexion/Plantar flexion intact Incision: dressing C/D/I No cellulitis present Compartment soft   Assessment/Plan: 1 Day Post-Op Procedure(s) (LRB): LEFT TOTAL HIP ARTHROPLASTY ANTERIOR APPROACH (Left) Advance diet Up with therapy D/C IV fluids WBAT LLE D/c after second PT session or once cleared by PT      Aundra Dubin 01/24/2022, 6:47 AM

## 2022-01-24 NOTE — Progress Notes (Signed)
Physical Therapy Treatment Patient Details Name: [name] MRN: [id number] DOB: 12/20/1940 Today's Date: 01/24/2022   History of Present Illness Pt is an 81 y/o female s/p L THA, direct anterior. PMH includes AAA, CAD s/p CABG, CKD, COPD, HTN, and polymyalgia Rheumatica.    PT Comments    Pt reports some mild lightheadedness and nausea throughout session, states she attributes this to pain medication. Pt ambulatory in hallway with use of RW and cues for form/safety. Pt instructed in THA exercises, limited tolerance for exercise post-gait. PT to see pt for second session prior to d/c.      Recommendations for follow up therapy are one component of a multi-disciplinary discharge planning process, led by the attending physician.  Recommendations may be updated based on patient status, additional functional criteria and insurance authorization.  Follow Up Recommendations  Follow physician's recommendations for discharge plan and follow up therapies     Assistance Recommended at Discharge Intermittent Supervision/Assistance  Patient can return home with the following A little help with walking and/or transfers;A little help with bathing/dressing/bathroom;Assistance with cooking/housework;Help with stairs or ramp for entrance;Assist for transportation   Equipment Recommendations  None recommended by PT    Recommendations for Other Services       Precautions / Restrictions Precautions Precautions: Fall Restrictions Weight Bearing Restrictions: Yes LLE Weight Bearing: Weight bearing as tolerated     Mobility  Bed Mobility Overal bed mobility: Needs Assistance Bed Mobility: Supine to Sit     Supine to sit: Min assist     General bed mobility comments: assist for LLE translation to EOB, trunk elevation    Transfers Overall transfer level: Needs assistance Equipment used: Rolling walker (2 wheels) Transfers: Sit to/from Stand Sit to Stand: Min guard            General transfer comment: close guard for safety, slow to rise and steady.    Ambulation/Gait Ambulation/Gait assistance: Supervision Gait Distance (Feet): 85 Feet Assistive device: Rolling walker (2 wheels) Gait Pattern/deviations: Step-through pattern, Decreased stride length, Trunk flexed, Antalgic Gait velocity: decr     General Gait Details: cues for upright posture, increasing antalgic gait with further distance   Stairs             Wheelchair Mobility    Modified Rankin (Stroke Patients Only)       Balance Overall balance assessment: Needs assistance Sitting-balance support: No upper extremity supported, Feet supported Sitting balance-Leahy Scale: Fair     Standing balance support: Bilateral upper extremity supported, Reliant on assistive device for balance Standing balance-Leahy Scale: Poor                              Cognition Arousal/Alertness: Awake/alert Behavior During Therapy: WFL for tasks assessed/performed Overall Cognitive Status: Within Functional Limits for tasks assessed                                          Exercises Total Joint Exercises Ankle Circles/Pumps: AROM, Both, 20 reps, Seated Long Arc Quad: AROM, Both, 10 reps, Seated    General Comments        Pertinent Vitals/Pain Pain Assessment Pain Assessment: Faces Faces Pain Scale: Hurts a little bit Pain Location: L hip Pain Descriptors / Indicators: Grimacing, Guarding Pain Intervention(s): Limited activity within patient's tolerance, Monitored during session, Repositioned  Home Living                          Prior Function            PT Goals (current goals can now be found in the care plan section) Acute Rehab PT Goals Patient Stated Goal: to go home PT Goal Formulation: With patient Time For Goal Achievement: 02/06/22 Potential to Achieve Goals: Good Progress towards PT goals: Progressing toward goals     Frequency    7X/week      PT Plan Current plan remains appropriate    Co-evaluation              AM-PAC PT "6 Clicks" Mobility   Outcome Measure  Help needed turning from your back to your side while in a flat bed without using bedrails?: A Little Help needed moving from lying on your back to sitting on the side of a flat bed without using bedrails?: A Little Help needed moving to and from a bed to a chair (including a wheelchair)?: A Little Help needed standing up from a chair using your arms (e.g., wheelchair or bedside chair)?: A Little Help needed to walk in hospital room?: A Little Help needed climbing 3-5 steps with a railing? : A Lot 6 Click Score: 17    End of Session   Activity Tolerance: Patient limited by fatigue Patient left: in bed;with call bell/phone within reach;with nursing/sitter in room Nurse Communication: Mobility status PT Visit Diagnosis: Other abnormalities of gait and mobility (R26.89);Muscle weakness (generalized) (M62.81);Difficulty in walking, not elsewhere classified (R26.2);Pain Pain - Right/Left: Left Pain - part of body: Hip     Time: 3295-1884 PT Time Calculation (min) (ACUTE ONLY): 21 min  Charges:  $Gait Training: 8-22 mins                     Stacie Glaze, PT DPT Acute Rehabilitation Services Pager [phone]  Office [phone]    [place] 01/24/2022, 1:25 PM

## 2022-01-24 NOTE — Progress Notes (Signed)
Pt's Foley was removed this AM @ 0600. Pt is attempted x 3 but is still unable to void. Pt has the urge but is unable to urinate. Foley cath was placed with 400cc of yellow urine returned. Holli Humbles, RN

## 2022-01-24 NOTE — Discharge Summary (Addendum)
Patient ID: [name] MRN: [id number] DOB/AGE: [birth year] 81 y.o.  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 Most likely scar. Mild subendocardial ischemia. EF 71% LOW RISK.    Cervical disc disease    fracture   Chronic kidney disease    COPD (chronic obstructive pulmonary disease) (HCC)    Diverticulosis    Hyperlipidemia    Hypertension    Hypothyroidism (acquired)    hypo   Myocardial infarction ([place]) 11/2014   TIA   Polymyalgia rheumatica ([place])    2011 Dr. Charlestine Night   Rheumatoid arthritis [facility]) 2011   Dr.Truslow; fracture knees, hands and wrists -    S/P CABG x 1 1996   CABG--LIMA-LAD for ostial LAD (not felt to be PCI amenable). EF NORMAL then; LIMA now atretic   Shortness of breath dyspnea    with exertion   Stroke ([place]) 11/2014   TIA    Urinary frequency     Surgeries: Procedure(s): LEFT TOTAL HIP ARTHROPLASTY ANTERIOR APPROACH on 01/23/2022   Consultants:   Discharged Condition: Improved  Hospital Course: [name] is an 81 y.o. female who was admitted 01/23/2022 for operative treatment ofPrimary osteoarthritis of left hip. Patient has severe unremitting pain that affects sleep, daily activities, and work/hobbies. After  pre-op clearance the patient was taken to the operating room on 01/23/2022 and underwent  Procedure(s): LEFT TOTAL HIP ARTHROPLASTY ANTERIOR APPROACH.  Patient developed urinary retention and urology was consulted.  D/c with foley and urology to arrange f/u  Patient was given perioperative antibiotics:  Anti-infectives (From admission, onward)    Start     Dose/Rate Route Frequency Ordered Stop   01/27/22 0000  cefadroxil (DURICEF) 500 MG capsule        500 mg Oral 2 times daily 01/27/22 0744     01/23/22 1600  ceFAZolin (ANCEF) IVPB 2g/100 mL premix        2 g 200 mL/hr over 30 Minutes Intravenous Every 6 hours 01/23/22 1026 01/23/22 2124   01/23/22 0824  vancomycin (VANCOCIN) powder  Status:  Discontinued          As needed 01/23/22 0824 01/23/22 0922   01/23/22 0615  ceFAZolin (ANCEF) IVPB 2g/100 mL premix        2 g 200 mL/hr over 30 Minutes Intravenous To ShortStay Surgical 01/23/22 0604 01/23/22 0749        Patient was given sequential compression devices, early ambulation, and chemoprophylaxis to prevent DVT.  Patient benefited maximally from hospital stay and there were no complications.    Recent vital signs: Patient Vitals for the past 24 hrs:  BP Temp Temp src Pulse Resp SpO2  01/27/22 0325 Sherry Kitchen)  [name] 98.5 F (36.9 C) -- 99 18 94 %  01/26/22 2312 (!) 155/48 98.5 F (36.9 C) -- 70 18 100 %  01/26/22 1919 (!) 141/55 98.5 F (36.9 C) Oral 83 18 94 %  01/26/22 1747 (!) 158/62 98.4 F (36.9 C) Oral 84 18 93 %  01/26/22 1221 125/61 97.7 F (36.5 C) Oral 76 16 99 %     Recent laboratory studies:  Recent Labs    01/24/22 0859  WBC 13.5*  HGB 11.0*  HCT 33.0*  PLT 293     Discharge Medications:   Allergies as of 01/27/2022       Reactions   Nsaids Other (See Comments)   Pt states all NSAIDS does not sit well onstomach   Aspirin Other (See Comments)   Hurts stomach   Codeine Nausea And Vomiting   Nitroglycerin Other (See Comments)   Heart rate drops         Medication List     STOP taking these medications    acetaminophen 500 MG tablet Commonly known as: TYLENOL       TAKE these medications    albuterol 108 (90 Base) MCG/ACT inhaler Commonly known as: VENTOLIN HFA Inhale 2 puffs into the lungs every 4 (four) hours as needed for wheezing or shortness of breath.   amLODipine 2.5 MG tablet Commonly known as: NORVASC Take 1 tablet (2.5 mg total) by mouth daily.   atorvastatin 80 MG tablet Commonly known as: LIPITOR TAKE 1 TABLET EVERY DAY   B-12 2500 MCG Tabs one po qd What changed:  how much to take how to take this when to take this additional instructions   cefadroxil 500 MG capsule Commonly known as: DURICEF Take 1 capsule (500 mg total) by mouth 2 (two) times daily.   CENTRUM ADULT PO Take by mouth. 1 Tablet Daily.   docusate sodium 100 MG capsule Commonly known as: Colace Take 1 capsule (100 mg total) by mouth daily as needed.   enoxaparin 40 MG/0.4ML injection Commonly known as: LOVENOX Inject 0.4 mLs (40 mg total) into the skin daily for 14 days.   famotidine 20 MG tablet Commonly known as: PEPCID TAKE 1 TABLET AT BEDTIME   gabapentin 300 MG capsule Commonly known as: NEURONTIN TAKE 1 CAPSULE BY MOUTH THREE TIMES A DAY What changed:  how much to take how to take this when to take this additional instructions   hydrochlorothiazide 12.5 MG tablet Commonly known as: HYDRODIURIL TAKE 1 TABLET BY MOUTH EVERY DAY   leflunomide 20 MG tablet Commonly known as: ARAVA Take 20 mg by mouth daily.   levothyroxine 88 MCG tablet Commonly known as: SYNTHROID TAKE 1 TABLET (88 MCG TOTAL) BY MOUTH DAILY BEFORE BREAKFAST. What changed: how much to take   loperamide 2 MG tablet Commonly known as: IMODIUM A-D Take 2 mg by mouth 4 (four) times daily as needed for diarrhea or loose stools.   methocarbamol 500 MG tablet Commonly known as: ROBAXIN Take 1 tablet (500 mg total) by mouth 2 (two) times  daily as needed.   metoprolol tartrate 50 MG tablet Commonly known as: LOPRESSOR Take 1.5 tablets (75 mg total) by mouth 2 (two) times daily.   ondansetron 4 MG tablet Commonly known as: Zofran Take 1 tablet (4 mg total) by mouth every 8 (eight) hours as needed for nausea or vomiting.   oxyCODONE-acetaminophen 5-325 MG tablet Commonly known as: Percocet Take 1-2 tablets by mouth every 6 (six) hours as needed. To be  taken after surgery   pantoprazole 40 MG tablet Commonly known as: PROTONIX TAKE 1 TABLET DAILY. TAKE 30 TO 60 MINUTES BEFORE FIRST MEAL OF THE DAY What changed: See the new instructions.   SIMPONI [place] every 2 (two) months. Every other month per Dr Jamie Brookes Respimat 2.5-2.5 MCG/ACT Aers Generic drug: Tiotropium Bromide-Olodaterol INHALE 2 PUFFS BY MOUTH INTO THE LUNGS DAILY   triamcinolone 55 MCG/ACT Aero nasal inhaler Commonly known as: NASACORT Place 1 spray into the nose at bedtime.   Vitamin D3 50 MCG (2000 UT) capsule Take 1 capsule (2,000 Units total) by mouth daily.               Durable Medical Equipment  (From admission, onward)           Start     Ordered   01/23/22 1045  DME Walker rolling  Once       Question:  Patient needs a walker to treat with the following condition  Answer:  History of hip replacement   01/23/22 1044   01/23/22 1045  DME 3 n 1  Once        01/23/22 1044   01/23/22 1045  DME Bedside commode  Once       Question:  Patient needs a bedside commode to treat with the following condition  Answer:  History of hip replacement   01/23/22 1044            Diagnostic Studies: DG Pelvis Portable  Result Date: 01/23/2022 CLINICAL DATA:  Postop left hip arthroplasty. EXAM: PORTABLE PELVIS 1-2 VIEWS COMPARISON:  Left hip radiographs 10/06/2021 FINDINGS: Sequelae of interval left total hip arthroplasty are identified. Gas is noted in the adjacent soft tissues. No acute fracture or dislocation is evident on this  single image. There is mild superior hip joint space narrowing on the right. IMPRESSION: Left total hip arthroplasty without evidence of acute osseous abnormality. Electronically Signed   By: Logan Bores M.D.   On: 01/23/2022 10:43   DG HIP UNILAT WITH PELVIS 1V LEFT  Result Date: 01/23/2022 CLINICAL DATA:  Fluoroscopy provided during hip replacement. Fluoroscopy time: 24 seconds. Cumulative air kerma: 2.49 mGy EXAM: DG HIP (WITH OR WITHOUT PELVIS) 1V*L* COMPARISON:  None Available. FINDINGS: Four images were obtained during left hip replacement. By the end of the study, the left hip is been replaced in hardware is in good position on frontal imaging. IMPRESSION: Fluoroscopy provided during left hip replacement as above. Electronically Signed   By: Dorise Bullion III M.D.   On: 01/23/2022 09:07   DG C-Arm 1-60 Min-No Report  Result Date: 01/23/2022 Fluoroscopy was utilized by the requesting physician.  No radiographic interpretation.   DG C-Arm 1-60 Min-No Report  Result Date: 01/23/2022 Fluoroscopy was utilized by the requesting physician.  No radiographic interpretation.    Disposition: Discharge disposition: 03-Skilled Nursing Facility          Follow-up Information     Leandrew Koyanagi, MD. Go on 02/07/2022.   Specialty: Orthopedic Surgery Why: at 10:30 am for your first in office appointment with Dr. Sherilyn Cooter information: [place] [phone]         [facility] Follow up.   Specialty: [place] Why: The home health agency will contact you for the first home visit Contact information: [place] [id number] [phone]  Signed: Aundra Dubin 01/27/2022, 7:46 AM

## 2022-01-25 ENCOUNTER — Other Ambulatory Visit: Payer: Self-pay | Admitting: Physician Assistant

## 2022-01-25 MED ORDER — TAMSULOSIN HCL 0.4 MG PO CAPS
0.4000 mg | ORAL_CAPSULE | Freq: Every day | ORAL | Status: DC
  Administered 2022-01-25 – 2022-01-30 (×6): 0.4 mg via ORAL
  Filled 2022-01-25 (×6): qty 1

## 2022-01-25 MED ORDER — CEFADROXIL 500 MG PO CAPS: 500.0000 mg | ORAL_CAPSULE | Freq: Two times a day (BID) | ORAL | 0 refills | Status: DC

## 2022-01-25 NOTE — Progress Notes (Signed)
Subjective: 2 Days Post-Op Procedure(s) (LRB): LEFT TOTAL HIP ARTHROPLASTY ANTERIOR APPROACH (Left) Patient reports pain as mild.  Feeling well and progressing with PT, but still having trouble urinating.    Objective: Vital signs in last 24 hours: Temp:  [97.6 F (36.4 C)-98 F (36.7 C)] 97.6 F (36.4 C) (09/27 0752) Pulse Rate:  [56-76] 71 (09/27 0752) Resp:  [16-20] 16 (09/27 0752) BP: (99-180)/(41-74) 101/68 (09/27 0752) SpO2:  [94 %-97 %] 96 % (09/27 0752)  Intake/Output from previous day: 09/26 0701 - 09/27 0700 In: 480 [P.O.:480] Out: 1075 [Urine:1075] Intake/Output this shift: No intake/output data recorded.  Recent Labs    01/23/22 0644 01/24/22 0859  HGB 12.2 11.0*   Recent Labs    01/23/22 0644 01/24/22 0859  WBC 9.5 13.5*  RBC 3.73* 3.39*  HCT 36.3 33.0*  PLT 323 293   Recent Labs    01/23/22 0644  NA 134*  K 4.3  CL 97*  CO2 26  BUN 65*  CREATININE 2.71*  GLUCOSE 90  CALCIUM 9.8   No results for input(s): "LABPT", "INR" in the last 72 hours.  Neurologically intact Neurovascular intact Sensation intact distally Intact pulses distally Dorsiflexion/Plantar flexion intact Incision: dressing C/D/I No cellulitis present Compartment soft   Assessment/Plan: 2 Days Post-Op Procedure(s) (LRB): LEFT TOTAL HIP ARTHROPLASTY ANTERIOR APPROACH (Left) Advance diet Up with therapy D/C IV fluids Discharge home with home health once able to urinate on her own Flomax ordered ABLA- mild and stable       Aundra Dubin 01/25/2022, 7:55 AM

## 2022-01-25 NOTE — Progress Notes (Signed)
Physical Therapy Treatment Patient Details Name: [name] MRN: [id number] DOB: 1941/02/03 Today's Date: 01/25/2022   History of Present Illness Pt is an 81 y/o female s/p L THA, direct anterior. PMH includes AAA, CAD s/p CABG, CKD, COPD, HTN, and polymyalgia Rheumatica.    PT Comments    Patient groggy and easily falling asleep during session. Required increased cues for sequencing and proper use of RW. Max cues during exercises for correct technique. Mobilizing with minguard to min assist for bed mobility, transfers, and gait. Per RN, husband will be with patient at home at [place].     Recommendations for follow up therapy are one component of a multi-disciplinary discharge planning process, led by the attending physician.  Recommendations may be updated based on patient status, additional functional criteria and insurance authorization.  Follow Up Recommendations  Follow physician's recommendations for discharge plan and follow up therapies     Assistance Recommended at Discharge Frequent or constant Supervision/Assistance  Patient can return home with the following A little help with walking and/or transfers;A little help with bathing/dressing/bathroom;Assistance with cooking/housework;Help with stairs or ramp for entrance;Assist for transportation   Equipment Recommendations  None recommended by PT    Recommendations for Other Services       Precautions / Restrictions Precautions Precautions: Fall Restrictions Weight Bearing Restrictions: Yes LLE Weight Bearing: Weight bearing as tolerated     Mobility  Bed Mobility Overal bed mobility: Needs Assistance Bed Mobility: Supine to Sit, Sit to Supine     Supine to sit: Min guard Sit to supine: Min assist   General bed mobility comments: assist for LE lifting into bed    Transfers Overall transfer level: Needs assistance Equipment used: Rolling walker (2 wheels) Transfers: Sit to/from Stand Sit to Stand: Min guard            General transfer comment: close guard for safety, slow to rise    Ambulation/Gait Ambulation/Gait assistance: Supervision Gait Distance (Feet): 15 Feet (x 2) Assistive device: Rolling walker (2 wheels) Gait Pattern/deviations: Step-through pattern, Decreased stride length, Trunk flexed, Antalgic Gait velocity: decr     General Gait Details: pt only wanted to walk to/from bathroom due to feeling she needed to urinate and unable to at this time (catheter just removed ~30 minutes ago per RN)   Stairs             Wheelchair Mobility    Modified Rankin (Stroke Patients Only)       Balance Overall balance assessment: Needs assistance Sitting-balance support: No upper extremity supported, Feet supported Sitting balance-Leahy Scale: Fair     Standing balance support: Bilateral upper extremity supported, Reliant on assistive device for balance Standing balance-Leahy Scale: Poor                              Cognition Arousal/Alertness: Lethargic Behavior During Therapy: WFL for tasks assessed/performed Overall Cognitive Status: Impaired/Different from baseline Area of Impairment: Attention, Memory, Following commands, Problem solving                   Current Attention Level: Sustained Memory: Decreased short-term memory Following Commands: Follows one step commands inconsistently, Follows one step commands with increased time     Problem Solving: Slow processing, Difficulty sequencing, Requires verbal cues, Requires tactile cues General Comments: required cues for hand placement with RW each of 3 transfers; required repeated cues to back up close enough to toilet prior to  sitting; easily falling asleep between exercises        Exercises Total Joint Exercises Ankle Circles/Pumps: AROM, Both, 10 reps Quad Sets: AROM, Left, 10 reps Short Arc Quad: AROM, Left, 10 reps Heel Slides: AROM, Left, Supine, 10 reps Hip ABduction/ADduction:  Left, Supine, AAROM, 10 reps Long Arc Quad: AROM, Both, 10 reps, Seated    General Comments        Pertinent Vitals/Pain Pain Assessment Pain Assessment: Faces Faces Pain Scale: Hurts a little bit Pain Location: L hip Pain Descriptors / Indicators: Guarding, Sore Pain Intervention(s): Limited activity within patient's tolerance, Monitored during session    Home Living                          Prior Function            PT Goals (current goals can now be found in the care plan section) Acute Rehab PT Goals Patient Stated Goal: to go home PT Goal Formulation: With patient Time For Goal Achievement: 02/06/22 Potential to Achieve Goals: Good Progress towards PT goals: Progressing toward goals    Frequency    7X/week      PT Plan Current plan remains appropriate    Co-evaluation              AM-PAC PT "6 Clicks" Mobility   Outcome Measure  Help needed turning from your back to your side while in a flat bed without using bedrails?: A Little Help needed moving from lying on your back to sitting on the side of a flat bed without using bedrails?: A Little Help needed moving to and from a bed to a chair (including a wheelchair)?: A Little Help needed standing up from a chair using your arms (e.g., wheelchair or bedside chair)?: A Little Help needed to walk in hospital room?: A Little Help needed climbing 3-5 steps with a railing? : A Little 6 Click Score: 18    End of Session Equipment Utilized During Treatment: Gait belt Activity Tolerance: Patient limited by fatigue Patient left: with call bell/phone within reach;in chair Nurse Communication: Mobility status;Other (comment) (tried to toilet) PT Visit Diagnosis: Other abnormalities of gait and mobility (R26.89);Muscle weakness (generalized) (M62.81);Difficulty in walking, not elsewhere classified (R26.2);Pain Pain - Right/Left: Left Pain - part of body: Hip     Time: 6294-7654 PT Time Calculation  (min) (ACUTE ONLY): 21 min  Charges:  $Therapeutic Exercise: 8-22 mins                      Arby Barrette, PT Acute Rehabilitation Services  Office [phone]    Rexanne Mano 01/25/2022, 9:24 AM

## 2022-01-25 NOTE — Progress Notes (Signed)
Physical Therapy Treatment Patient Details Name: [name] MRN: [id number] DOB: Apr 21, 1941 Today's Date: 01/25/2022   History of Present Illness Pt is an 81 y/o female s/p L THA, direct anterior. PMH includes AAA, CAD s/p CABG, CKD, COPD, HTN, and polymyalgia Rheumatica.    PT Comments    Patient seen for second session as discharge home today looking less likely due to inability to urinate. Patient remained groggy during session, even after ambulation. She was falling asleep sitting up as trying to complete exercises. RN made aware.     Recommendations for follow up therapy are one component of a multi-disciplinary discharge planning process, led by the attending physician.  Recommendations may be updated based on patient status, additional functional criteria and insurance authorization.  Follow Up Recommendations  Follow physician's recommendations for discharge plan and follow up therapies     Assistance Recommended at Discharge Frequent or constant Supervision/Assistance  Patient can return home with the following A little help with walking and/or transfers;A little help with bathing/dressing/bathroom;Assistance with cooking/housework;Help with stairs or ramp for entrance;Assist for transportation   Equipment Recommendations  None recommended by PT    Recommendations for Other Services       Precautions / Restrictions Precautions Precautions: Fall Restrictions Weight Bearing Restrictions: Yes LLE Weight Bearing: Weight bearing as tolerated     Mobility  Bed Mobility Overal bed mobility: Needs Assistance Bed Mobility: Supine to Sit, Sit to Supine     Supine to sit: Min guard Sit to supine: Min assist   General bed mobility comments: up in recliner    Transfers Overall transfer level: Needs assistance Equipment used: Rolling walker (2 wheels) Transfers: Sit to/from Stand Sit to Stand: Min guard           General transfer comment: close guard for safety  with slight posterior bias, slow to rise; vc for hand placement each time (stood twice)    Ambulation/Gait Ambulation/Gait assistance: Supervision Gait Distance (Feet): 180 Feet Assistive device: Rolling walker (2 wheels) Gait Pattern/deviations: Step-through pattern, Decreased stride length, Trunk flexed, Antalgic Gait velocity: decr     General Gait Details: very short shuffling steps initially; with continued cues and assist to keep RW moving forward, she was able to incr step length   Stairs             Wheelchair Mobility    Modified Rankin (Stroke Patients Only)       Balance Overall balance assessment: Needs assistance Sitting-balance support: No upper extremity supported, Feet supported Sitting balance-Leahy Scale: Fair     Standing balance support: Bilateral upper extremity supported, Reliant on assistive device for balance Standing balance-Leahy Scale: Poor                              Cognition Arousal/Alertness: Lethargic Behavior During Therapy: WFL for tasks assessed/performed Overall Cognitive Status: Impaired/Different from baseline Area of Impairment: Attention, Memory, Following commands, Problem solving                   Current Attention Level: Sustained Memory: Decreased short-term memory Following Commands: Follows one step commands inconsistently, Follows one step commands with increased time     Problem Solving: Slow processing, Difficulty sequencing, Requires verbal cues, Requires tactile cues General Comments: required cues for hand placement with RW each of 3 transfers; required repeated cues to back up close enough to toilet prior to sitting; easily falling asleep between exercises  Exercises Total Joint Exercises Ankle Circles/Pumps: AROM, Both, 10 reps Quad Sets: AROM, Left, 10 reps Short Arc Quad: AROM, Left, 10 reps Heel Slides: AROM, Left, Supine, 10 reps Hip ABduction/ADduction: Left, Supine,  AAROM, 10 reps Long Arc Quad: AROM, 10 reps, Seated, Left    General Comments        Pertinent Vitals/Pain Pain Assessment Pain Assessment: Faces Faces Pain Scale: Hurts a little bit Pain Location: L hip Pain Descriptors / Indicators: Guarding, Sore Pain Intervention(s): Limited activity within patient's tolerance, Monitored during session    Home Living                          Prior Function            PT Goals (current goals can now be found in the care plan section) Acute Rehab PT Goals Patient Stated Goal: to go home PT Goal Formulation: With patient Time For Goal Achievement: 02/06/22 Potential to Achieve Goals: Good Progress towards PT goals: Progressing toward goals    Frequency    7X/week      PT Plan Current plan remains appropriate    Co-evaluation              AM-PAC PT "6 Clicks" Mobility   Outcome Measure  Help needed turning from your back to your side while in a flat bed without using bedrails?: A Little Help needed moving from lying on your back to sitting on the side of a flat bed without using bedrails?: A Little Help needed moving to and from a bed to a chair (including a wheelchair)?: A Little Help needed standing up from a chair using your arms (e.g., wheelchair or bedside chair)?: A Little Help needed to walk in hospital room?: A Little Help needed climbing 3-5 steps with a railing? : A Little 6 Click Score: 18    End of Session Equipment Utilized During Treatment: Gait belt Activity Tolerance: Patient limited by fatigue Patient left: with call bell/phone within reach;in chair Nurse Communication: Mobility status;Other (comment) (very sleepy/lethargic) PT Visit Diagnosis: Other abnormalities of gait and mobility (R26.89);Muscle weakness (generalized) (M62.81);Difficulty in walking, not elsewhere classified (R26.2);Pain Pain - Right/Left: Left Pain - part of body: Hip     Time: 0160-1093 PT Time Calculation (min)  (ACUTE ONLY): 23 min  Charges:  $Gait Training: 23-37 mins                      [place]  Office [phone]    Rexanne Mano 01/25/2022, 2:27 PM

## 2022-01-26 NOTE — TOC Initial Note (Addendum)
Transition of Care [facility]) - Initial/Assessment Note    Patient Details  Name: [name] MRN: [id number] Date of Birth: 07/26/1940  [facility] CM/SW Contact:    Pollie Friar, RN Phone Number: 01/26/2022, 11:34 AM  Clinical Narrative:                 Pt is from ILF at [facility] with her spouse. S/p: LEFT TOTAL HIP ARTHROPLASTY ANTERIOR APPROACH. Recommendations are for SNF rehab before she returns to her apartment. CM has updated Katie at [facility] and she states patient will have a bed once auth obtained. FL2 sent to [facility].  Pt and spouse in agreement.  TOC following.  1158Blaine Hamper ID [id number]   Expected Discharge Plan: Skilled Nursing Facility Barriers to Discharge: Continued Medical Work up   Patient Goals and CMS Choice   CMS Medicare.gov Compare Post Acute Care list provided to:: Patient Choice offered to / list presented to : Patient, Spouse  Expected Discharge Plan and Services Expected Discharge Plan: [place]   Discharge Planning Services: CM Consult Post Acute Care Choice: [place] Living arrangements for the past 2 months: Apartment Expected Discharge Date: 01/26/22               DME Arranged:  (Patient reports she has a RW and cane at home, but may need a 3in1/BSC)         HH Arranged: PT [place] Agency: [place]        Prior Living Arrangements/Services Living arrangements for the past 2 months: Apartment Lives with:: Spouse Patient language and need for interpreter reviewed:: Yes Do you feel safe going back to the place where you live?: Yes      Need for Family Participation in Patient Care: Yes (Comment) Care giver support system in place?: No (comment)   Criminal Activity/Legal Involvement Pertinent to Current Situation/Hospitalization: No - Comment as needed  Activities of Daily Living Home Assistive Devices/Equipment: Gilford Rile (specify type) ADL Screening  (condition at time of admission) Patient's cognitive ability adequate to safely complete daily activities?: Yes Is the patient deaf or have difficulty hearing?: No Does the patient have difficulty seeing, even when wearing glasses/contacts?: No Does the patient have difficulty concentrating, remembering, or making decisions?: No Patient able to express need for assistance with ADLs?: Yes Does the patient have difficulty dressing or bathing?: No Independently performs ADLs?: Yes (appropriate for developmental age) Does the patient have difficulty walking or climbing stairs?: No Weakness of Legs: Both Weakness of Arms/Hands: None  Permission Sought/Granted                  Emotional Assessment Appearance:: Appears stated age Attitude/Demeanor/Rapport: Engaged Affect (typically observed): Accepting Orientation: : Oriented to Self, Oriented to Place, Oriented to Situation   Psych Involvement: No (comment)  Admission diagnosis:  Status post total replacement of left hip [Z96.642] Patient Active Problem List   Diagnosis Date Noted   Status post total replacement of left hip 01/23/2022   Lower GI bleeding 11/18/2021   Hematochezia    Acute blood loss anemia (ABLA)    BRBPR (bright red blood per rectum) 11/17/2021   CKD (chronic kidney disease) stage 4, GFR 15-29 ml/min (HCC) 11/17/2021   Primary osteoarthritis of left hip 10/06/2021   Left hip pain 12/09/2020   Aortic atherosclerosis ([place]) 09/07/2020   Gait disorder 06/08/2020   Neoplasm of uncertain behavior of skin 03/14/2020   Contusion of face  03/10/2020   Arthralgia 12/25/2019   Ankle swelling 08/08/2019   Hyperkalemia 05/15/2019   Osteoporosis 02/26/2018   Colon polyp 06/28/2016   COPD GOLD I 05/28/2016   Upper airway cough syndrome 05/22/2016   Acute respiratory failure with hypoxemia ([place]) 04/16/2016   Acute respiratory failure with hypoxia (HCC) 04/15/2016   Acute bronchitis 04/03/2016   Wrist pain, right  12/14/2015   Lung mass 03/02/2015   Pre-operative cardiovascular examination 02/21/2015   DOE (dyspnea on exertion) 02/16/2015   TIA (transient ischemic attack) 12/31/2014   Abnormal mental state 12/30/2014   Diarrhea 11/26/2014   Well adult exam 11/26/2014   Seasonal allergies 12/31/2013   Splenic artery aneurysm ([place]) 04/07/2013   Cerumen impaction 03/20/2013   Dyslipidemia, goal LDL below 70    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   Long term (current) use of anticoagulants 11/08/2010   Rheumatoid arthritis ([place]) 06/23/2010   Pain in joint 11/26/2009   Hypothyroidism 12/08/2006   MACULAR DEGENERATION 12/08/2006   Essential hypertension 12/08/2006   Diaphragmatic hernia 12/08/2006   ASPLENIA 12/08/2006   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) 12/22/1994    Class: History of   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; now atretic and significantly improved LAD lesion without intervention 12/22/1994    Class: History of   PCP:  Plotnikov, [name], MD Pharmacy:   [id number]: [phone]: [phone] [facility] [place] [address]Phone: [phone]: [phone]    Social Determinants of Health (SDOH) Interventions    Readmission Risk Interventions     No data to display

## 2022-01-26 NOTE — Evaluation (Addendum)
Occupational Therapy Evaluation Patient Details Name: [name] MRN: [id number] DOB: Sep 16, 1940 Today's Date: 01/26/2022   History of Present Illness Pt is an 81 y/o female s/p L THA, direct anterior. PMH includes AAA, CAD s/p CABG, CKD, COPD, HTN, and polymyalgia Rheumatica.   Clinical Impression   Pt either walked with a cane or rollator prior to admission. She sat for showering and was independent in self care. She and her husband rely on their ILF for meals and housekeeping. Pt presents with drowsiness, impaired cognition, mild hip pain and decreased standing balance. Pt stood with min guard assist and multimodal cues for hand placement with walker use. She ambulated with min guard assist. Pt requires set up to moderate assistance for ADLs. Pt is considering further rehab prior to return home.      Recommendations for follow up therapy are one component of a multi-disciplinary discharge planning process, led by the attending physician.  Recommendations may be updated based on patient status, additional functional criteria and insurance authorization.   Follow Up Recommendations  Skilled Nursing Facility   Assistance Recommended at Discharge Frequent or constant Supervision/Assistance  Patient can return home with the following A little help with walking and/or transfers;A lot of help with bathing/dressing/bathroom;Assistance with cooking/housework;Assist for transportation;Help with stairs or ramp for entrance    Functional Status Assessment  Patient has had a recent decline in their functional status and demonstrates the ability to make significant improvements in function in a reasonable and predictable amount of time.  Equipment Recommendations  Other (comment) (defer to next venue)    Recommendations for Other Services       Precautions / Restrictions Precautions Precautions: Fall Restrictions Weight Bearing Restrictions: Yes LLE Weight Bearing: Weight bearing as  tolerated      Mobility Bed Mobility               General bed mobility comments: pt in chair    Transfers Overall transfer level: Needs assistance Equipment used: Rolling walker (2 wheels) Transfers: Sit to/from Stand Sit to Stand: Min guard           General transfer comment: multimodal cues for hand placement from recliner      Balance Overall balance assessment: Needs assistance   Sitting balance-Leahy Scale: Good Sitting balance - Comments: no LOB donning R sock   Standing balance support: Bilateral upper extremity supported, Reliant on assistive device for balance Standing balance-Leahy Scale: Poor                             ADL either performed or assessed with clinical judgement   ADL Overall ADL's : Needs assistance/impaired Eating/Feeding: Independent;Sitting   Grooming: Set up;Sitting   Upper Body Bathing: Minimal assistance;Sitting   Lower Body Bathing: Moderate assistance;Sit to/from stand   Upper Body Dressing : Set up;Sitting   Lower Body Dressing: Moderate assistance;Sit to/from stand Lower Body Dressing Details (indicate cue type and reason): can cross R LE over L to reach R foot, unable to access L foot Toilet Transfer: Min guard;Ambulation           Functional mobility during ADLs: Min guard;Rolling walker (2 wheels)       Vision Baseline Vision/History: 1 Wears glasses Patient Visual Report: No change from baseline       Perception     Praxis      Pertinent Vitals/Pain Pain Assessment Pain Assessment: 0-10 Faces Pain Scale: Hurts a little bit  Pain Location: L hip Pain Descriptors / Indicators: Sore, Discomfort Pain Intervention(s): Monitored during session, Repositioned     Hand Dominance Left   Extremity/Trunk Assessment Upper Extremity Assessment Upper Extremity Assessment: Overall WFL for tasks assessed   Lower Extremity Assessment Lower Extremity Assessment: Defer to PT evaluation   Cervical  / Trunk Assessment Cervical / Trunk Assessment: Normal   Communication Communication Communication: No difficulties   Cognition Arousal/Alertness: Awake/alert Behavior During Therapy: Flat affect Overall Cognitive Status: Impaired/Different from baseline Area of Impairment: Memory, Following commands, Problem solving, Attention                   Current Attention Level: Sustained Memory: Decreased short-term memory Following Commands: Follows one step commands inconsistently, Follows one step commands with increased time     Problem Solving: Slow processing, Difficulty sequencing, Requires verbal cues, Requires tactile cues, Decreased initiation General Comments: pt attempting to stand before walker was in front of her, difficulty following commands for hand placement with sit to stand     General Comments  Husband arrived during session and observed pt ambulating. Discussed distance she would have to walk from car to their apartment and he plans to borrow a wheelchair to get her inside. Reviewed transfer technique with husband as pt has difficulty recalling where to place her hands.    Exercises     Shoulder Instructions      Home Living Family/patient expects to be discharged to:: Private residence Living Arrangements: Spouse/significant other Available Help at Discharge: Family;Available 24 hours/day Type of Home: Independent living facility ([facility]) Home Access: [place]: One level     Bathroom Shower/Tub: Teaching laboratory technician Toilet: Handicapped height     Home Equipment: Conservation officer, nature (2 wheels);Rollator (4 wheels);Cane - single point;Shower seat;Grab bars - tub/shower;Grab bars - toilet;Hand held shower head          Prior Functioning/Environment Prior Level of Function : Independent/Modified Independent             Mobility Comments: Uses cane inside of home and rollator when outside of home ADLs Comments: goes to  dining room for all meals, facility does housekeeping monthly        OT Problem List: Decreased strength;Impaired balance (sitting and/or standing);Decreased cognition;Decreased knowledge of use of DME or AE;Pain      OT Treatment/Interventions: Self-care/ADL training;DME and/or AE instruction;Therapeutic activities;Cognitive remediation/compensation;Patient/family education;Balance training    OT Goals(Current goals can be found in the care plan section) Acute Rehab OT Goals OT Goal Formulation: With patient Time For Goal Achievement: 02/09/22 Potential to Achieve Goals: Good ADL Goals Pt Will Perform Grooming: with supervision;standing Pt Will Perform Lower Body Bathing: with supervision;with adaptive equipment;sit to/from stand Pt Will Perform Lower Body Dressing: with supervision;sit to/from stand;with adaptive equipment Pt Will Transfer to Toilet: with supervision;ambulating;bedside commode Pt Will Perform Toileting - Clothing Manipulation and hygiene: with supervision;sit to/from stand  OT Frequency: Min 2X/week    Co-evaluation              AM-PAC OT "6 Clicks" Daily Activity     Outcome Measure Help from another person eating meals?: None Help from another person taking care of personal grooming?: A Little Help from another person toileting, which includes using toliet, bedpan, or urinal?: A Little Help from another person bathing (including washing, rinsing, drying)?: A Lot Help from another person to put on and taking off regular upper body clothing?: A Little Help from  another person to put on and taking off regular lower body clothing?: A Lot 6 Click Score: 17   End of Session Equipment Utilized During Treatment: Gait belt;Rolling walker (2 wheels) Nurse Communication: Mobility status  Activity Tolerance: Patient tolerated treatment well Patient left: in chair;with call bell/phone within reach;with family/visitor present  OT Visit Diagnosis: Unsteadiness on  feet (R26.81);Other abnormalities of gait and mobility (R26.89);Pain;Other symptoms and signs involving cognitive function Pain - Right/Left: Left Pain - part of body: Hip                Time: 1031-1050 OT Time Calculation (min): 19 min Charges:  OT General Charges $OT Visit: 1 Visit OT Evaluation $OT Eval Moderate Complexity: [facility], OTR/L Acute Rehabilitation Services Office: [phone]   Malka So 01/26/2022, 11:02 AM

## 2022-01-26 NOTE — Progress Notes (Addendum)
Physical Therapy Treatment Patient Details Name: [name] MRN: [id number] DOB: 1940-09-25 Today's Date: 01/26/2022   History of Present Illness Pt is an 81 y/o female s/p L THA, direct anterior. PMH includes AAA, CAD s/p CABG, CKD, COPD, HTN, and polymyalgia Rheumatica.    PT Comments    Patient more alert compared to 9/27, however remains forgetful related to transfer technique and correct hand placement. Husband present during session and educated on how to cue patient for sit to stand and stand to sit. Per RN, MD has ordered [place] (as pt will be going home with catheter). Husband can arrange to [facility] a [facility] at [facility] to help get patient from the car to the apartment. Plan is for discharge home today.   Addendum-After OT evaluation, husband feeling he cannot manage pt at home at this time. Patient can benefit from continued therapies at SNF level (at [facility]) until she is able to return to [place] with her husband.     Recommendations for follow up therapy are one component of a multi-disciplinary discharge planning process, led by the attending physician.  Recommendations may be updated based on patient status, additional functional criteria and insurance authorization.  Follow Up Recommendations  Follow physician's recommendations for discharge plan and follow up therapies     Assistance Recommended at Discharge Frequent or constant Supervision/Assistance  Patient can return home with the following A little help with walking and/or transfers;A little help with bathing/dressing/bathroom;Assistance with cooking/housework;Help with stairs or ramp for entrance;Assist for transportation   Equipment Recommendations  None recommended by PT    Recommendations for Other Services       Precautions / Restrictions Precautions Precautions: Fall Restrictions Weight Bearing Restrictions: Yes LLE Weight Bearing: Weight bearing as tolerated     Mobility  Bed  Mobility Overal bed mobility: Needs Assistance Bed Mobility: Supine to Sit     Supine to sit: Min guard     General bed mobility comments: incr time and cues for sequencing    Transfers Overall transfer level: Needs assistance Equipment used: Rolling walker (2 wheels) Transfers: Sit to/from Stand Sit to Stand: Min guard, Min assist           General transfer comment: min assist from EOB with max cues for hand placement (pt repeatedly reaching up to pull on RW); close guard  from chair with max cues again for hand placement    Ambulation/Gait Ambulation/Gait assistance: Supervision Gait Distance (Feet): 180 Feet Assistive device: Rolling walker (2 wheels) Gait Pattern/deviations: Step-through pattern, Decreased stride length, Trunk flexed, Antalgic Gait velocity: decr     General Gait Details: very short shuffling steps initially; with continued cues and assist to keep RW moving forward, she was able to incr step length; cues for upright posture   Stairs             Wheelchair Mobility    Modified Rankin (Stroke Patients Only)       Balance Overall balance assessment: Needs assistance Sitting-balance support: No upper extremity supported, Feet supported Sitting balance-Leahy Scale: Fair     Standing balance support: Bilateral upper extremity supported, Reliant on assistive device for balance Standing balance-Leahy Scale: Poor                              Cognition Arousal/Alertness: Awake/alert Behavior During Therapy: Flat affect Overall Cognitive Status: Impaired/Different from baseline Area of Impairment: Memory, Following commands, Problem solving  Memory: Decreased short-term memory Following Commands: Follows one step commands inconsistently, Follows one step commands with increased time     Problem Solving: Slow processing, Difficulty sequencing, Requires verbal cues, Requires tactile cues, Decreased  initiation General Comments: required repeated cues for hand placement with RW each of 2 transfers and incr time; asking her to stand from chair and twice she scooted back into chair instead of standing;        Exercises Total Joint Exercises Ankle Circles/Pumps: AROM, Both, 10 reps Quad Sets: Left, 10 reps Heel Slides: AROM, Both, 10 reps    General Comments General comments (skin integrity, edema, etc.): Husband arrived during session and observed pt ambulating. Discussed distance she would have to walk from car to their apartment and he plans to borrow a wheelchair to get her inside. Reviewed transfer technique with husband as pt has difficulty recalling where to place her hands.      Pertinent Vitals/Pain Pain Assessment Pain Assessment: 0-10 Faces Pain Scale: Hurts worst Pain Location: L hip Pain Descriptors / Indicators: Guarding, Sore, Moaning, Grimacing Pain Intervention(s): Limited activity within patient's tolerance, Monitored during session    Home Living                          Prior Function            PT Goals (current goals can now be found in the care plan section) Acute Rehab PT Goals Patient Stated Goal: to go home PT Goal Formulation: With patient Time For Goal Achievement: 02/06/22 Potential to Achieve Goals: Good Progress towards PT goals: Progressing toward goals    Frequency    7X/week      PT Plan Current plan remains appropriate    Co-evaluation              AM-PAC PT "6 Clicks" Mobility   Outcome Measure  Help needed turning from your back to your side while in a flat bed without using bedrails?: A Little Help needed moving from lying on your back to sitting on the side of a flat bed without using bedrails?: A Little Help needed moving to and from a bed to a chair (including a wheelchair)?: A Little Help needed standing up from a chair using your arms (e.g., wheelchair or bedside chair)?: A Little Help needed to walk  in hospital room?: A Little Help needed climbing 3-5 steps with a railing? : A Little 6 Click Score: 18    End of Session Equipment Utilized During Treatment: Gait belt Activity Tolerance: Patient limited by fatigue Patient left: with call bell/phone within reach;in chair;with family/visitor present Nurse Communication: Mobility status (more alert, still forgetful; reporting increased pain (has not had pain medication since 9/27 am due to lethargy yesterday)) PT Visit Diagnosis: Other abnormalities of gait and mobility (R26.89);Muscle weakness (generalized) (M62.81);Difficulty in walking, not elsewhere classified (R26.2);Pain Pain - Right/Left: Left Pain - part of body: Hip     Time: 4098-1191 PT Time Calculation (min) (ACUTE ONLY): 32 min  Charges:  $Gait Training: 8-22 mins $Therapeutic Exercise: 8-22 mins                      [place]  Office [phone]    Rexanne Mano 01/26/2022, 9:29 AM

## 2022-01-26 NOTE — Progress Notes (Signed)
Subjective: 3 Days Post-Op Procedure(s) (LRB): LEFT TOTAL HIP ARTHROPLASTY ANTERIOR APPROACH (Left) Patient reports pain as mild.  Still having trouble urinating yesterday despite given flomax.  Foley in place  Objective: Vital signs in last 24 hours: Temp:  [97.6 F (36.4 C)-98.5 F (36.9 C)] 98.5 F (36.9 C) (09/28 0655) Pulse Rate:  [63-74] 72 (09/28 0655) Resp:  [16-18] 18 (09/28 0655) BP: (101-173)/(44-72) 126/44 (09/28 0655) SpO2:  [93 %-98 %] 95 % (09/28 0655)  Intake/Output from previous day: 09/27 0701 - 09/28 0700 In: -  Out: 950 [Urine:950] Intake/Output this shift: Total I/O In: -  Out: 300 [Urine:300]  Recent Labs    01/24/22 0859  HGB 11.0*   Recent Labs    01/24/22 0859  WBC 13.5*  RBC 3.39*  HCT 33.0*  PLT 293   No results for input(s): "NA", "K", "CL", "CO2", "BUN", "CREATININE", "GLUCOSE", "CALCIUM" in the last 72 hours. No results for input(s): "LABPT", "INR" in the last 72 hours.  Neurologically intact Neurovascular intact Sensation intact distally Intact pulses distally Dorsiflexion/Plantar flexion intact Incision: dressing C/D/I No cellulitis present Compartment soft   Assessment/Plan: 3 Days Post-Op Procedure(s) (LRB): LEFT TOTAL HIP ARTHROPLASTY ANTERIOR APPROACH (Left) Advance diet Up with therapy WBAT LLE D/c home with hhpt Urinary retention- Dr. Louis Meckel with urology consulted yesterday.  Recommended that the patient d/c home with foley catheter and that they will arrange f/u in their office.  We have also send in cefadroxil to take while catheter in place F/u with Dr. Erlinda Hong 2 weeks post-op       Aundra Dubin 01/26/2022, 7:28 AM

## 2022-01-26 NOTE — NC FL2 (Addendum)
[place] LEVEL OF CARE SCREENING TOOL     IDENTIFICATION  Patient Name: [name] Birthdate: October 23, 1940 Sex: female Admission Date (Current Location): 01/23/2022  [facility] and [place] Number:  Herbalist and Address:  [facility]. [facility], [place] [address] [place], [place] [address]      Provider Number: [id number]  Attending Physician Name and Address:  Leandrew Koyanagi, MD  Relative Name and Phone Number:       Current Level of Care: Hospital Recommended Level of Care: [place] Prior Approval Number:    Date Approved/Denied:   PASRR Number:  [id number]  Discharge Plan: SNF    Current Diagnoses: Patient Active Problem List   Diagnosis Date Noted   Status post total replacement of left hip 01/23/2022   Lower GI bleeding 11/18/2021   Hematochezia    Acute blood loss anemia (ABLA)    BRBPR (bright red blood per rectum) 11/17/2021   CKD (chronic kidney disease) stage 4, GFR 15-29 ml/min ([place]) 11/17/2021   Primary osteoarthritis of left hip 10/06/2021   Left hip pain 12/09/2020   Aortic atherosclerosis ([place]) 09/07/2020   Gait disorder 06/08/2020   Neoplasm of uncertain behavior of skin 03/14/2020   Contusion of face 03/10/2020   Arthralgia 12/25/2019   Ankle swelling 08/08/2019   Hyperkalemia 05/15/2019   Osteoporosis 02/26/2018   Colon polyp 06/28/2016   COPD GOLD I 05/28/2016   Upper airway cough syndrome 05/22/2016   Acute respiratory failure with hypoxemia ([place]) 04/16/2016   Acute respiratory failure with hypoxia ([place]) 04/15/2016   Acute bronchitis 04/03/2016   Wrist pain, right 12/14/2015   Lung mass 03/02/2015   Pre-operative cardiovascular examination 02/21/2015   DOE (dyspnea on exertion) 02/16/2015   TIA (transient ischemic attack) 12/31/2014   Abnormal mental state 12/30/2014   Diarrhea 11/26/2014   Well adult exam 11/26/2014   Seasonal allergies 12/31/2013   Splenic artery aneurysm ([place])  04/07/2013   Cerumen impaction 03/20/2013   Dyslipidemia, goal LDL below 70    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   Long term (current) use of anticoagulants 11/08/2010   Rheumatoid arthritis ([place]) 06/23/2010   Pain in joint 11/26/2009   Hypothyroidism 12/08/2006   MACULAR DEGENERATION 12/08/2006   Essential hypertension 12/08/2006   Diaphragmatic hernia 12/08/2006   ASPLENIA 12/08/2006   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) 12/22/1994   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; now atretic and significantly improved LAD lesion without intervention 12/22/1994    Orientation RESPIRATION BLADDER Height & Weight     Self, Time, Situation, Place  Normal Indwelling catheter Weight:   Height:     BEHAVIORAL SYMPTOMS/MOOD NEUROLOGICAL BOWEL NUTRITION STATUS      Continent Diet (Regular with thin liquids)  AMBULATORY STATUS COMMUNICATION OF NEEDS Skin   Limited Assist Verbally Surgical wounds (hip dressing)                       Personal Care Assistance Level of Assistance  Bathing, Feeding, Dressing Bathing Assistance: Limited assistance Feeding assistance: Independent Dressing Assistance: Limited assistance     Functional Limitations Info  Sight, Hearing, Speech Sight Info: Impaired Hearing Info: Adequate Speech Info: Adequate    SPECIAL CARE FACTORS FREQUENCY  PT (By licensed PT), OT (By licensed OT)     PT Frequency: 5x/wk OT Frequency: 5x/wk  Contractures Contractures Info: Not present    Additional Factors Info  Code Status, Allergies, Psychotropic Code Status Info: Full Allergies Info: NSAIDS/ Aspirin/ Codeine/ Nitroglycerin Psychotropic Info: oxycontin 10 mg every 12 hours         Current Medications (01/26/2022):  This is the current hospital active medication list Current Facility-Administered Medications  Medication Dose Route Frequency Provider Last Rate Last  Admin   0.9 %  sodium chloride infusion   Intravenous Continuous Leandrew Koyanagi, MD 75 mL/hr at 01/25/22 1330 New Bag at 01/25/22 1330   acetaminophen (TYLENOL) tablet 325-650 mg  325-650 mg Oral Q6H PRN Leandrew Koyanagi, MD       alum & mag hydroxide-simeth (MAALOX/MYLANTA) 200-200-20 MG/5ML suspension 30 mL  30 mL Oral Q4H PRN Leandrew Koyanagi, MD       amLODipine (NORVASC) tablet 2.5 mg  2.5 mg Oral Daily Leandrew Koyanagi, MD   2.5 mg at 01/26/22 0934   Chlorhexidine Gluconate Cloth 2 % PADS 6 each  6 each Topical Q0600 Leandrew Koyanagi, MD   6 each at 01/26/22 0602   diphenhydrAMINE (BENADRYL) 12.5 MG/5ML elixir 25 mg  25 mg Oral Q4H PRN Leandrew Koyanagi, MD       docusate sodium (COLACE) capsule 100 mg  100 mg Oral BID Leandrew Koyanagi, MD   100 mg at 01/25/22 2100   enoxaparin (LOVENOX) injection 30 mg  30 mg Subcutaneous Q24H Leandrew Koyanagi, MD   30 mg at 01/25/22 2100   ferrous sulfate tablet 325 mg  325 mg Oral TID PC Leandrew Koyanagi, MD   325 mg at 01/26/22 0932   HYDROmorphone (DILAUDID) injection 0.5-1 mg  0.5-1 mg Intravenous Q4H PRN Leandrew Koyanagi, MD       levothyroxine (SYNTHROID) tablet 44 mcg  44 mcg Oral QAC breakfast Leandrew Koyanagi, MD   44 mcg at 01/26/22 0601   magnesium citrate solution 1 Bottle  1 Bottle Oral Once PRN Leandrew Koyanagi, MD       menthol-cetylpyridinium (CEPACOL) lozenge 3 mg  1 lozenge Oral PRN Leandrew Koyanagi, MD       Or   phenol (CHLORASEPTIC) mouth spray 1 spray  1 spray Mouth/Throat PRN Leandrew Koyanagi, MD       methocarbamol (ROBAXIN) tablet 500 mg  500 mg Oral Q6H PRN Leandrew Koyanagi, MD       Or   methocarbamol (ROBAXIN) 500 mg in dextrose 5 % 50 mL IVPB  500 mg Intravenous Q6H PRN Leandrew Koyanagi, MD       metoCLOPramide (REGLAN) tablet 5-10 mg  5-10 mg Oral Q8H PRN Leandrew Koyanagi, MD       Or   metoCLOPramide (REGLAN) injection 5-10 mg  5-10 mg Intravenous Q8H PRN Leandrew Koyanagi, MD       metoprolol tartrate (LOPRESSOR) tablet 75 mg  75 mg Oral BID Leandrew Koyanagi, MD   75 mg at  01/26/22 0933   mupirocin ointment (BACTROBAN) 2 % 1 Application  1 Application Nasal BID Leandrew Koyanagi, MD   1 Application at 53/66/44 2100   ondansetron (ZOFRAN) tablet 4 mg  4 mg Oral Q6H PRN Leandrew Koyanagi, MD       Or   ondansetron [facility]) injection 4 mg  4 mg Intravenous Q6H PRN Leandrew Koyanagi, MD       oxyCODONE (Oxy IR/ROXICODONE) immediate release tablet 10-15 mg  10-15 mg Oral Q4H  PRN Leandrew Koyanagi, MD   10 mg at 01/25/22 0831   oxyCODONE (Oxy IR/ROXICODONE) immediate release tablet 5-10 mg  5-10 mg Oral Q4H PRN Leandrew Koyanagi, MD   5 mg at 01/23/22 1323   oxyCODONE (OXYCONTIN) 12 hr tablet 10 mg  10 mg Oral Q12H Leandrew Koyanagi, MD   10 mg at 01/25/22 0832   pantoprazole (PROTONIX) EC tablet 40 mg  40 mg Oral Daily Leandrew Koyanagi, MD   40 mg at 01/26/22 0934   polyethylene glycol (MIRALAX / GLYCOLAX) packet 17 g  17 g Oral Daily Leandrew Koyanagi, MD   17 g at 01/26/22 0933   sorbitol 70 % solution 30 mL  30 mL Oral Daily PRN Leandrew Koyanagi, MD       tamsulosin [facility]) capsule 0.4 mg  0.4 mg Oral Daily Aundra Dubin, PA-C   0.4 mg at 01/26/22 0076     Discharge Medications: Please see discharge summary for a list of discharge medications.  Relevant Imaging Results:  Relevant Lab Results:   Additional Information SS#: [id number]  Pollie Friar, RN

## 2022-01-27 ENCOUNTER — Other Ambulatory Visit: Payer: Self-pay | Admitting: Physician Assistant

## 2022-01-27 MED ORDER — CEFADROXIL 500 MG PO CAPS: 500.0000 mg | ORAL_CAPSULE | Freq: Two times a day (BID) | ORAL | 0 refills | Status: DC

## 2022-01-27 MED ORDER — OXYCODONE-ACETAMINOPHEN 5-325 MG PO TABS: 1.0000 | ORAL_TABLET | Freq: Four times a day (QID) | ORAL | 0 refills | Status: DC | PRN

## 2022-01-27 MED ORDER — HYDROCHLOROTHIAZIDE 12.5 MG PO TABS
12.5000 mg | ORAL_TABLET | Freq: Every day | ORAL | Status: DC
  Administered 2022-01-27 – 2022-01-30 (×4): 12.5 mg via ORAL
  Filled 2022-01-27 (×4): qty 1

## 2022-01-27 MED ORDER — ENOXAPARIN SODIUM 40 MG/0.4ML IJ SOSY: 40.0000 mg | PREFILLED_SYRINGE | INTRAMUSCULAR | 0 refills | Status: DC

## 2022-01-27 MED ORDER — CEFADROXIL 500 MG PO CAPS
500.0000 mg | ORAL_CAPSULE | Freq: Two times a day (BID) | ORAL | Status: DC
  Administered 2022-01-27 – 2022-01-30 (×7): 500 mg via ORAL
  Filled 2022-01-27 (×8): qty 1

## 2022-01-27 NOTE — Progress Notes (Signed)
Subjective: 4 Days Post-Op Procedure(s) (LRB): LEFT TOTAL HIP ARTHROPLASTY ANTERIOR APPROACH (Left) Patient reports pain as mild.    Objective: Vital signs in last 24 hours: Temp:  [97.7 F (36.5 C)-98.5 F (36.9 C)] 98.5 F (36.9 C) (09/29 0325) Pulse Rate:  [70-99] 99 (09/29 0325) Resp:  [16-18] 18 (09/29 0325) BP: (125-158)/(48-64) 150/64 (09/29 0325) SpO2:  [93 %-100 %] 94 % (09/29 0325)  Intake/Output from previous day: 09/28 0701 - 09/29 0700 In: -  Out: 750 [Urine:750] Intake/Output this shift: No intake/output data recorded.  Recent Labs    01/24/22 0859  HGB 11.0*   Recent Labs    01/24/22 0859  WBC 13.5*  RBC 3.39*  HCT 33.0*  PLT 293   No results for input(s): "NA", "K", "CL", "CO2", "BUN", "CREATININE", "GLUCOSE", "CALCIUM" in the last 72 hours. No results for input(s): "LABPT", "INR" in the last 72 hours.  Neurologically intact Neurovascular intact Sensation intact distally Intact pulses distally Dorsiflexion/Plantar flexion intact Incision: dressing C/D/I No cellulitis present Compartment soft   Assessment/Plan: 4 Days Post-Op Procedure(s) (LRB): LEFT TOTAL HIP ARTHROPLASTY ANTERIOR APPROACH (Left) Advance diet Up with therapy D/C IV fluids Discharge to SNF D/c with foley per urology who will arrange f/u WBAT LLE       [name] 01/27/2022, 7:43 AM

## 2022-01-27 NOTE — Progress Notes (Signed)
Physical Therapy Treatment Patient Details Name: [name] MRN: [id number] DOB: 1940-08-14 Today's Date: 01/27/2022   History of Present Illness Pt is an 81 y/o female s/p L THA, direct anterior. PMH includes AAA, CAD s/p CABG, CKD, COPD, HTN, and polymyalgia Rheumatica.    PT Comments    Pt more lethargic with confusion and flat affect today. Pt unaware of date or that she had L hip surgery. Pt requiring increased assist today due to lack of initiation and ability to follow commands/stay on task. Pt unable to manage RW during ambulation, especially turning. Pt with decline both cognitively and functionally. Acute PT to cont to follow.     Recommendations for follow up therapy are one component of a multi-disciplinary discharge planning process, led by the attending physician.  Recommendations may be updated based on patient status, additional functional criteria and insurance authorization.  Follow Up Recommendations  Follow physician's recommendations for discharge plan and follow up therapies (pt now going SNF per Dr. Erlinda Hong)     Assistance Recommended at Discharge Frequent or constant Supervision/Assistance  Patient can return home with the following A little help with walking and/or transfers;A little help with bathing/dressing/bathroom;Assistance with cooking/housework;Help with stairs or ramp for entrance;Assist for transportation   Equipment Recommendations  Rolling walker (2 wheels)    Recommendations for Other Services       Precautions / Restrictions Precautions Precautions: Fall Restrictions Weight Bearing Restrictions: Yes LLE Weight Bearing: Weight bearing as tolerated     Mobility  Bed Mobility Overal bed mobility: Needs Assistance Bed Mobility: Supine to Sit     Supine to sit: Min assist     General bed mobility comments: pt requiring max verbal and tactile cues to initiate task, modA to scoot hips all the way to the edge, pt with poor initiation and  ability to stay on task    Transfers Overall transfer level: Needs assistance Equipment used: Rolling walker (2 wheels) Transfers: Sit to/from Stand Sit to Stand: Min assist           General transfer comment: max verbal and tactile cues to complete sit to stand, minA to initiate power up    Ambulation/Gait Ambulation/Gait assistance: Min assist Gait Distance (Feet): 100 Feet Assistive device: Rolling walker (2 wheels) Gait Pattern/deviations: Decreased stride length, Trunk flexed, Antalgic, Step-to pattern, Shuffle Gait velocity: decr     General Gait Details: very short shuffling steps initially; with continued cues and assist to keep RW moving forward, she was able to incr step length with PT continuously pushing RW forward, pt unable to turn with walker requiring maxA for walker management to complete turn   Stairs             Wheelchair Mobility    Modified Rankin (Stroke Patients Only)       Balance Overall balance assessment: Needs assistance Sitting-balance support: No upper extremity supported, Feet supported Sitting balance-Leahy Scale: Fair Sitting balance - Comments: pt very lethargic   Standing balance support: Bilateral upper extremity supported, Reliant on assistive device for balance Standing balance-Leahy Scale: Poor Standing balance comment: requires external support                            Cognition Arousal/Alertness: Lethargic Behavior During Therapy: Flat affect Overall Cognitive Status: Impaired/Different from baseline Area of Impairment: Memory, Following commands, Problem solving, Attention  Current Attention Level: Sustained, Focused Memory: Decreased short-term memory Following Commands: Follows one step commands inconsistently, Follows one step commands with increased time     Problem Solving: Slow processing, Difficulty sequencing, Requires verbal cues, Requires tactile cues, Decreased  initiation General Comments: pt very slow to respond, didn't recall the date or that she had a L THR        Exercises      General Comments General comments (skin integrity, edema, etc.): pt warm to touch, increasingly more lethargic      Pertinent Vitals/Pain Pain Assessment Pain Assessment: Faces Faces Pain Scale: Hurts a little bit Pain Location: L hip Pain Descriptors / Indicators: Sore, Discomfort Pain Intervention(s): Monitored during session    Home Living                          Prior Function            PT Goals (current goals can now be found in the care plan section) Acute Rehab PT Goals Patient Stated Goal: to go home PT Goal Formulation: With patient Time For Goal Achievement: 02/06/22 Potential to Achieve Goals: Good Progress towards PT goals: Not progressing toward goals - comment    Frequency    Min 5X/week      PT Plan Current plan remains appropriate    Co-evaluation              AM-PAC PT "6 Clicks" Mobility   Outcome Measure  Help needed turning from your back to your side while in a flat bed without using bedrails?: A Lot Help needed moving from lying on your back to sitting on the side of a flat bed without using bedrails?: A Lot Help needed moving to and from a bed to a chair (including a wheelchair)?: A Lot Help needed standing up from a chair using your arms (e.g., wheelchair or bedside chair)?: A Lot Help needed to walk in hospital room?: A Lot Help needed climbing 3-5 steps with a railing? : A Lot 6 Click Score: 12    End of Session Equipment Utilized During Treatment: Gait belt Activity Tolerance: Patient limited by fatigue Patient left: with call bell/phone within reach;in chair;with family/visitor present Nurse Communication: Mobility status PT Visit Diagnosis: Other abnormalities of gait and mobility (R26.89);Muscle weakness (generalized) (M62.81);Difficulty in walking, not elsewhere classified  (R26.2);Pain Pain - Right/Left: Left Pain - part of body: Hip     Time: 2831-5176 PT Time Calculation (min) (ACUTE ONLY): 19 min  Charges:  $Gait Training: 8-22 mins                     Kittie Plater, PT, DPT Acute Rehabilitation Services Secure chat preferred Office #: [phone]    [name] 01/27/2022, 8:55 AM

## 2022-01-27 NOTE — Progress Notes (Signed)
Staff noted LLE swelling on patient this afternoon.. Incision CDI with bruising noted. PA informed  and order received to applied compression stockings on patien. Patient with stockings on BLE and comfortable in bed.

## 2022-01-28 ENCOUNTER — Observation Stay (HOSPITAL_BASED_OUTPATIENT_CLINIC_OR_DEPARTMENT_OTHER): Payer: Medicare PPO

## 2022-01-28 DIAGNOSIS — R609 Edema, unspecified: Secondary | ICD-10-CM

## 2022-01-28 DIAGNOSIS — M7989 Other specified soft tissue disorders: Secondary | ICD-10-CM | POA: Diagnosis not present

## 2022-01-28 MED ORDER — HYDROCODONE-ACETAMINOPHEN 5-325 MG PO TABS: 1.0000 | ORAL_TABLET | Freq: Four times a day (QID) | ORAL | Status: DC | PRN

## 2022-01-28 MED ORDER — HYDROCODONE-ACETAMINOPHEN 5-325 MG PO TABS: 1.0000 | ORAL_TABLET | ORAL | Status: DC | PRN

## 2022-01-28 NOTE — Progress Notes (Signed)
Lower extremity venous has been completed.   Preliminary results in CV Proc.   Jinny Blossom Temesgen Weightman 01/28/2022 12:28 PM

## 2022-01-28 NOTE — Progress Notes (Addendum)
  Subjective: [name] is a 81 y.o. female s/p left THA.  They are POD 5.  Pt's pain is controlled.  Pt has ambulated with some difficulty.  She was able to walk about 90 feet with physical therapy earlier this morning.  Denies any chest pain or shortness of breath.  No abdominal pain.  Has not had a bowel movement yet since surgery.  She does have an appetite but it is reduced.  No nausea or vomiting.  Does have some calf pain that she has noticed today.  Objective: Vital signs in last 24 hours: Temp:  [98.2 F (36.8 C)-98.7 F (37.1 C)] 98.6 F (37 C) (09/30 0907) Pulse Rate:  [80-104] 104 (09/30 0907) Resp:  [16-18] 17 (09/30 0907) BP: (107-172)/(48-72) 143/59 (09/30 0907) SpO2:  [94 %-100 %] 94 % (09/30 0907) Weight:  [68.5 kg] 68.5 kg (09/30 0813)  Intake/Output from previous day: 09/29 0701 - 09/30 0700 In: -  Out: 1400 [Urine:1400] Intake/Output this shift: No intake/output data recorded.  Exam:  No gross blood or drainage overlying the dressing Left foot warm and well-perfused Sensation intact distally in the left foot Able to dorsiflex and plantarflex the left foot Calf tenderness noted bilaterally with bilateral positive Bevelyn Buckles' sign Patient is quite somnolent on exam and falls asleep while speaking senses.  She needs to be aroused in order to wake back up   Labs: No results for input(s): "HGB" in the last 72 hours. No results for input(s): "WBC", "RBC", "HCT", "PLT" in the last 72 hours. No results for input(s): "NA", "K", "CL", "CO2", "BUN", "CREATININE", "GLUCOSE", "CALCIUM" in the last 72 hours. No results for input(s): "LABPT", "INR" in the last 72 hours.  Assessment/Plan: Pt is POD 5 s/p left THA.    -Plan to discharge to SNF in coming days  -She will continue with Foley catheter to skilled nursing facility  -WBAT with a walker  -Discontinued oxycodone both short acting and long-acting due to patient's sedation.  Started hydrocodone every 6 hours as  needed.  Please reach out if her pain is severely uncontrolled.  -Ordered bilateral ultrasound to rule out DVT.  Unlikely given that she is on Lovenox but does have compelling exam findings for this.     Luke Nate Common 01/28/2022, 11:33 AM

## 2022-01-28 NOTE — Progress Notes (Signed)
Patient is transferred to unit 5N01 at this time. Report given to receiving nurse, [place] RN with all questions answered. Left unit via wheelchair with all belongings at side.

## 2022-01-28 NOTE — Progress Notes (Signed)
Physical Therapy Treatment Patient Details Name: [name] MRN: [id number] DOB: 05/15/1940 Today's Date: 01/28/2022   History of Present Illness Pt is an 81 y/o female s/p L THA, direct anterior. PMH includes AAA, CAD s/p CABG, CKD, COPD, HTN, and polymyalgia Rheumatica.    PT Comments    Patient more alert but with more pain today. REquired max encouragement to ambulate 90 ft with RW and min assist. Exercises deferred due to nursing ready to transport pt to new room.    Recommendations for follow up therapy are one component of a multi-disciplinary discharge planning process, led by the attending physician.  Recommendations may be updated based on patient status, additional functional criteria and insurance authorization.  Follow Up Recommendations  Follow physician's recommendations for discharge plan and follow up therapies (pt now going SNF per Dr. Erlinda Hong)     Assistance Recommended at Discharge Frequent or constant Supervision/Assistance  Patient can return home with the following A little help with walking and/or transfers;A little help with bathing/dressing/bathroom;Assistance with cooking/housework;Help with stairs or ramp for entrance;Assist for transportation   Equipment Recommendations  Rolling walker (2 wheels)    Recommendations for Other Services       Precautions / Restrictions Precautions Precautions: Fall Restrictions Weight Bearing Restrictions: Yes LLE Weight Bearing: Weight bearing as tolerated     Mobility  Bed Mobility               General bed mobility comments: sitting EOB on arrival; to chair    Transfers Overall transfer level: Needs assistance Equipment used: Rolling walker (2 wheels) Transfers: Sit to/from Stand Sit to Stand: Min assist           General transfer comment: max verbal and tactile cues to complete sit to stand, minA to initiate power up (after several unsuccessful attempts witout assist)     Ambulation/Gait Ambulation/Gait assistance: Min assist Gait Distance (Feet): 90 Feet Assistive device: Rolling walker (2 wheels) Gait Pattern/deviations: Decreased stride length, Trunk flexed, Antalgic, Step-to pattern, Shuffle Gait velocity: decr     General Gait Details: very short shuffling steps initially; with continued cues and assist to keep RW moving forward, she was able to incr step length with PT continuously pushing RW forward, pt unable to turn with walker requiring maxA for walker management to complete turn   Stairs             Wheelchair Mobility    Modified Rankin (Stroke Patients Only)       Balance Overall balance assessment: Needs assistance Sitting-balance support: No upper extremity supported, Feet supported Sitting balance-Leahy Scale: Fair     Standing balance support: Bilateral upper extremity supported, Reliant on assistive device for balance Standing balance-Leahy Scale: Poor Standing balance comment: requires external support                            Cognition Arousal/Alertness: Lethargic Behavior During Therapy: Flat affect Overall Cognitive Status: Impaired/Different from baseline Area of Impairment: Memory, Following commands, Problem solving, Attention, Orientation                 Orientation Level: Time Current Attention Level: Sustained, Focused Memory: Decreased short-term memory Following Commands: Follows one step commands inconsistently, Follows one step commands with increased time     Problem Solving: Slow processing, Difficulty sequencing, Requires verbal cues, Requires tactile cues, Decreased initiation General Comments: able to recall where she is and that she's had left hip surgery;  not oriented to day or even a.m. vs p.m.; continues to need lots of cues and assist to manage RW        Exercises      General Comments        Pertinent Vitals/Pain Pain Assessment Pain Assessment:  Faces Faces Pain Scale: Hurts even more Pain Location: L hip Pain Descriptors / Indicators: Sore, Discomfort, Grimacing Pain Intervention(s): Limited activity within patient's tolerance, Monitored during session    Home Living                          Prior Function            PT Goals (current goals can now be found in the care plan section) Acute Rehab PT Goals Patient Stated Goal: to go home Time For Goal Achievement: 02/06/22 Potential to Achieve Goals: Good Progress towards PT goals: Not progressing toward goals - comment (more painful today)    Frequency    Min 5X/week      PT Plan Current plan remains appropriate    Co-evaluation              AM-PAC PT "6 Clicks" Mobility   Outcome Measure  Help needed turning from your back to your side while in a flat bed without using bedrails?: A Lot Help needed moving from lying on your back to sitting on the side of a flat bed without using bedrails?: A Lot Help needed moving to and from a bed to a chair (including a wheelchair)?: A Lot Help needed standing up from a chair using your arms (e.g., wheelchair or bedside chair)?: A Lot Help needed to walk in hospital room?: A Lot Help needed climbing 3-5 steps with a railing? : A Lot 6 Click Score: 12    End of Session Equipment Utilized During Treatment: Gait belt Activity Tolerance: Patient limited by pain Patient left: in chair;with nursing/sitter in room (into transport chair) Nurse Communication: Mobility status PT Visit Diagnosis: Other abnormalities of gait and mobility (R26.89);Muscle weakness (generalized) (M62.81);Difficulty in walking, not elsewhere classified (R26.2);Pain Pain - Right/Left: Left Pain - part of body: Hip     Time: 1751-0258 PT Time Calculation (min) (ACUTE ONLY): 19 min  Charges:  $Gait Training: 8-22 mins                      [place]  Office [phone]    [name] 01/28/2022, 9:00 AM

## 2022-01-29 DIAGNOSIS — M069 Rheumatoid arthritis, unspecified: Secondary | ICD-10-CM | POA: Diagnosis present

## 2022-01-29 DIAGNOSIS — R339 Retention of urine, unspecified: Secondary | ICD-10-CM | POA: Diagnosis not present

## 2022-01-29 DIAGNOSIS — Z86010 Personal history of colonic polyps: Secondary | ICD-10-CM | POA: Diagnosis not present

## 2022-01-29 DIAGNOSIS — Z9081 Acquired absence of spleen: Secondary | ICD-10-CM | POA: Diagnosis not present

## 2022-01-29 DIAGNOSIS — J449 Chronic obstructive pulmonary disease, unspecified: Secondary | ICD-10-CM | POA: Diagnosis present

## 2022-01-29 DIAGNOSIS — Z96641 Presence of right artificial hip joint: Secondary | ICD-10-CM

## 2022-01-29 DIAGNOSIS — Z8249 Family history of ischemic heart disease and other diseases of the circulatory system: Secondary | ICD-10-CM | POA: Diagnosis not present

## 2022-01-29 DIAGNOSIS — M25552 Pain in left hip: Secondary | ICD-10-CM | POA: Diagnosis present

## 2022-01-29 DIAGNOSIS — Z823 Family history of stroke: Secondary | ICD-10-CM | POA: Diagnosis not present

## 2022-01-29 DIAGNOSIS — I129 Hypertensive chronic kidney disease with stage 1 through stage 4 chronic kidney disease, or unspecified chronic kidney disease: Secondary | ICD-10-CM | POA: Diagnosis present

## 2022-01-29 DIAGNOSIS — M659 Synovitis and tenosynovitis, unspecified: Secondary | ICD-10-CM | POA: Diagnosis present

## 2022-01-29 DIAGNOSIS — I252 Old myocardial infarction: Secondary | ICD-10-CM | POA: Diagnosis not present

## 2022-01-29 DIAGNOSIS — I82451 Acute embolism and thrombosis of right peroneal vein: Secondary | ICD-10-CM | POA: Diagnosis present

## 2022-01-29 DIAGNOSIS — E039 Hypothyroidism, unspecified: Secondary | ICD-10-CM | POA: Diagnosis present

## 2022-01-29 DIAGNOSIS — M1612 Unilateral primary osteoarthritis, left hip: Secondary | ICD-10-CM | POA: Diagnosis present

## 2022-01-29 DIAGNOSIS — Z8673 Personal history of transient ischemic attack (TIA), and cerebral infarction without residual deficits: Secondary | ICD-10-CM | POA: Diagnosis not present

## 2022-01-29 DIAGNOSIS — M353 Polymyalgia rheumatica: Secondary | ICD-10-CM | POA: Diagnosis present

## 2022-01-29 DIAGNOSIS — Z951 Presence of aortocoronary bypass graft: Secondary | ICD-10-CM | POA: Diagnosis not present

## 2022-01-29 DIAGNOSIS — Z8679 Personal history of other diseases of the circulatory system: Secondary | ICD-10-CM | POA: Diagnosis not present

## 2022-01-29 DIAGNOSIS — Z9049 Acquired absence of other specified parts of digestive tract: Secondary | ICD-10-CM | POA: Diagnosis not present

## 2022-01-29 DIAGNOSIS — Z83438 Family history of other disorder of lipoprotein metabolism and other lipidemia: Secondary | ICD-10-CM | POA: Diagnosis not present

## 2022-01-29 DIAGNOSIS — E785 Hyperlipidemia, unspecified: Secondary | ICD-10-CM | POA: Diagnosis present

## 2022-01-29 DIAGNOSIS — Q8901 Asplenia (congenital): Secondary | ICD-10-CM | POA: Diagnosis not present

## 2022-01-29 DIAGNOSIS — N184 Chronic kidney disease, stage 4 (severe): Secondary | ICD-10-CM | POA: Diagnosis present

## 2022-01-29 DIAGNOSIS — D62 Acute posthemorrhagic anemia: Secondary | ICD-10-CM | POA: Diagnosis not present

## 2022-01-29 DIAGNOSIS — I2510 Atherosclerotic heart disease of native coronary artery without angina pectoris: Secondary | ICD-10-CM | POA: Diagnosis present

## 2022-01-29 MED ORDER — CHLORHEXIDINE GLUCONATE CLOTH 2 % EX PADS
6.0000 | MEDICATED_PAD | Freq: Every day | CUTANEOUS | Status: DC
  Administered 2022-01-29 – 2022-01-30 (×2): 6 via TOPICAL

## 2022-01-29 NOTE — Plan of Care (Signed)
  Problem: Education: Goal: Knowledge of the prescribed therapeutic regimen will improve Outcome: Progressing   Problem: Activity: Goal: Ability to avoid complications of mobility impairment will improve Outcome: Progressing   Problem: Clinical Measurements: Goal: Postoperative complications will be avoided or minimized Outcome: Progressing   Problem: Pain Management: Goal: Pain level will decrease with appropriate interventions Outcome: Progressing   Problem: Skin Integrity: Goal: Will show signs of wound healing Outcome: Progressing   

## 2022-01-29 NOTE — Progress Notes (Signed)
  Subjective: Patient stable.  Having mild to moderate pain in the hip.  Had shortened therapy session yesterday.  Currently has Foley catheter.  Ultrasound yesterday was positive for age indeterminant peroneal vein DVT.  Patient is on Lovenox 40 mg subcu daily.   Objective: Vital signs in last 24 hours: Temp:  [97.6 F (36.4 C)-98.2 F (36.8 C)] 97.6 F (36.4 C) (10/01 0740) Pulse Rate:  [78-97] 78 (10/01 0740) Resp:  [15-20] 15 (10/01 0740) BP: (127-180)/(60-79) 172/79 (10/01 0740) SpO2:  [96 %-98 %] 98 % (10/01 0740)  Intake/Output from previous day: 09/30 0701 - 10/01 0700 In: -  Out: 700 [Urine:700] Intake/Output this shift: Total I/O In: -  Out: 1000 [Urine:1000]  Exam:  Dorsiflexion/Plantar flexion intact No cellulitis present  Labs: No results for input(s): "HGB" in the last 72 hours. No results for input(s): "WBC", "RBC", "HCT", "PLT" in the last 72 hours. No results for input(s): "NA", "K", "CL", "CO2", "BUN", "CREATININE", "GLUCOSE", "CALCIUM" in the last 72 hours. No results for input(s): "LABPT", "INR" in the last 72 hours.  Assessment/Plan: Plan at this time is discontinue Foley catheter in the morning.  No calf tenderness today.  Patient is on Lovenox for DVT prophylaxis.  Anticipate skilled nursing home placement Monday versus Tuesday.   G Scott Dolphus Linch 01/29/2022, 10:10 AM

## 2022-01-30 ENCOUNTER — Encounter: Payer: Self-pay | Admitting: Nurse Practitioner

## 2022-01-30 ENCOUNTER — Non-Acute Institutional Stay (SKILLED_NURSING_FACILITY): Payer: Self-pay | Admitting: Nurse Practitioner

## 2022-01-30 DIAGNOSIS — I714 Abdominal aortic aneurysm, without rupture, unspecified: Secondary | ICD-10-CM | POA: Diagnosis not present

## 2022-01-30 DIAGNOSIS — K5901 Slow transit constipation: Secondary | ICD-10-CM

## 2022-01-30 DIAGNOSIS — K625 Hemorrhage of anus and rectum: Secondary | ICD-10-CM | POA: Diagnosis not present

## 2022-01-30 DIAGNOSIS — G459 Transient cerebral ischemic attack, unspecified: Secondary | ICD-10-CM

## 2022-01-30 DIAGNOSIS — R531 Weakness: Secondary | ICD-10-CM | POA: Diagnosis not present

## 2022-01-30 DIAGNOSIS — K219 Gastro-esophageal reflux disease without esophagitis: Secondary | ICD-10-CM

## 2022-01-30 DIAGNOSIS — I700 Atherosclerosis of aorta: Secondary | ICD-10-CM

## 2022-01-30 DIAGNOSIS — R339 Retention of urine, unspecified: Secondary | ICD-10-CM | POA: Diagnosis not present

## 2022-01-30 DIAGNOSIS — M5090 Cervical disc disorder, unspecified, unspecified cervical region: Secondary | ICD-10-CM | POA: Diagnosis not present

## 2022-01-30 DIAGNOSIS — Z96642 Presence of left artificial hip joint: Secondary | ICD-10-CM | POA: Diagnosis not present

## 2022-01-30 DIAGNOSIS — D62 Acute posthemorrhagic anemia: Secondary | ICD-10-CM | POA: Diagnosis not present

## 2022-01-30 DIAGNOSIS — M069 Rheumatoid arthritis, unspecified: Secondary | ICD-10-CM | POA: Diagnosis not present

## 2022-01-30 DIAGNOSIS — N184 Chronic kidney disease, stage 4 (severe): Secondary | ICD-10-CM

## 2022-01-30 DIAGNOSIS — I10 Essential (primary) hypertension: Secondary | ICD-10-CM | POA: Diagnosis not present

## 2022-01-30 DIAGNOSIS — E785 Hyperlipidemia, unspecified: Secondary | ICD-10-CM

## 2022-01-30 DIAGNOSIS — M1612 Unilateral primary osteoarthritis, left hip: Secondary | ICD-10-CM

## 2022-01-30 DIAGNOSIS — E034 Atrophy of thyroid (acquired): Secondary | ICD-10-CM | POA: Diagnosis not present

## 2022-01-30 DIAGNOSIS — I82409 Acute embolism and thrombosis of unspecified deep veins of unspecified lower extremity: Secondary | ICD-10-CM | POA: Insufficient documentation

## 2022-01-30 DIAGNOSIS — I824Z1 Acute embolism and thrombosis of unspecified deep veins of right distal lower extremity: Secondary | ICD-10-CM | POA: Diagnosis not present

## 2022-01-30 DIAGNOSIS — Z743 Need for continuous supervision: Secondary | ICD-10-CM | POA: Diagnosis not present

## 2022-01-30 DIAGNOSIS — R269 Unspecified abnormalities of gait and mobility: Secondary | ICD-10-CM | POA: Diagnosis not present

## 2022-01-30 DIAGNOSIS — I2510 Atherosclerotic heart disease of native coronary artery without angina pectoris: Secondary | ICD-10-CM | POA: Diagnosis not present

## 2022-01-30 DIAGNOSIS — J449 Chronic obstructive pulmonary disease, unspecified: Secondary | ICD-10-CM

## 2022-01-30 DIAGNOSIS — D649 Anemia, unspecified: Secondary | ICD-10-CM | POA: Diagnosis not present

## 2022-01-30 DIAGNOSIS — K5790 Diverticulosis of intestine, part unspecified, without perforation or abscess without bleeding: Secondary | ICD-10-CM | POA: Diagnosis not present

## 2022-01-30 DIAGNOSIS — E559 Vitamin D deficiency, unspecified: Secondary | ICD-10-CM

## 2022-01-30 LAB — CREATININE, SERUM
Creatinine, Ser: 1.83 mg/dL — ABNORMAL HIGH (ref 0.44–1.00)
GFR, Estimated: 27 mL/min — ABNORMAL LOW (ref >60)

## 2022-01-30 MED ORDER — ENOXAPARIN SODIUM 40 MG/0.4ML IJ SOSY: 40.0000 mg | PREFILLED_SYRINGE | INTRAMUSCULAR | 0 refills | Status: DC

## 2022-01-30 NOTE — Assessment & Plan Note (Signed)
take Vit B12, Hgb 11.0 01/24/22, f/u CBC/diff

## 2022-01-30 NOTE — Progress Notes (Signed)
Physical Therapy Treatment Patient Details Name: [name] MRN: [id number] DOB: 05-Nov-1940 Today's Date: 01/30/2022   History of Present Illness Pt is an 81 y/o female s/p L THA, direct anterior. PMH includes AAA, CAD s/p CABG, CKD, COPD, HTN, and polymyalgia Rheumatica.    PT Comments    Pt with good progress today.  Improved cognition and orientation requiring fewer cues.  Able to ambulate 12' slowly with RW.  Does require min A for transfers and increased time.  Continue plan of care.     Recommendations for follow up therapy are one component of a multi-disciplinary discharge planning process, led by the attending physician.  Recommendations may be updated based on patient status, additional functional criteria and insurance authorization.  Follow Up Recommendations  Follow physician's recommendations for discharge plan and follow up therapies     Assistance Recommended at Discharge Frequent or constant Supervision/Assistance  Patient can return home with the following A little help with walking and/or transfers;A little help with bathing/dressing/bathroom;Assistance with cooking/housework;Help with stairs or ramp for entrance;Assist for transportation   Equipment Recommendations  Rolling walker (2 wheels)    Recommendations for Other Services       Precautions / Restrictions Precautions Precautions: Fall Restrictions Weight Bearing Restrictions: Yes LLE Weight Bearing: Weight bearing as tolerated     Mobility  Bed Mobility           Sit to supine: HOB elevated, Min assist   General bed mobility comments: sitting EOB on arrival; min A for legs back to bed with HOB elevated and cues    Transfers Overall transfer level: Needs assistance Equipment used: Rolling walker (2 wheels) Transfers: Sit to/from Stand Sit to Stand: Min assist           General transfer comment: Cues for safe hand placement; performed x 2 during session     Ambulation/Gait Ambulation/Gait assistance: Min assist Gait Distance (Feet): 80 Feet Assistive device: Rolling walker (2 wheels) Gait Pattern/deviations: Step-to pattern, Decreased stride length, Shuffle Gait velocity: decr     General Gait Details: Very short, slow steps with cues to continue.  Cues for RW proximity and posture.  Min A to steady.  Did not require assist to push RW forward but did require verbal cues.   Stairs             Wheelchair Mobility    Modified Rankin (Stroke Patients Only)       Balance Overall balance assessment: Needs assistance Sitting-balance support: No upper extremity supported, Feet supported Sitting balance-Leahy Scale: Good     Standing balance support: Bilateral upper extremity supported, Reliant on assistive device for balance Standing balance-Leahy Scale: Poor                              Cognition Arousal/Alertness: Awake/alert Behavior During Therapy: Flat affect Overall Cognitive Status: Impaired/Different from baseline                   Orientation Level: Disoriented to, Time Current Attention Level: Selective Memory: Decreased short-term memory Following Commands: Follows one step commands consistently Safety/Judgement: Decreased awareness of safety   Problem Solving: Slow processing General Comments: Improvement from prior treatment; aware of hip surgery; following commands; cues for safety        Exercises Total Joint Exercises Ankle Circles/Pumps: AROM, Both, 20 reps Quad Sets: AROM, Left, 10 reps, Supine Hip ABduction/ADduction: Left, Supine, AAROM, 10 reps Long Arc Quad:  AROM, Both, 20 reps, Seated (10x2; cues for controlled full ROM)    General Comments        Pertinent Vitals/Pain Pain Assessment Pain Assessment: 0-10 Pain Score: 3  Pain Location: L hip Pain Descriptors / Indicators: Sore, Discomfort Pain Intervention(s): Limited activity within patient's tolerance,  Monitored during session, Premedicated before session    Home Living                          Prior Function            PT Goals (current goals can now be found in the care plan section) Progress towards PT goals: Progressing toward goals    Frequency    Min 5X/week      PT Plan Current plan remains appropriate    Co-evaluation              AM-PAC PT "6 Clicks" Mobility   Outcome Measure  Help needed turning from your back to your side while in a flat bed without using bedrails?: A Little Help needed moving from lying on your back to sitting on the side of a flat bed without using bedrails?: A Little Help needed moving to and from a bed to a chair (including a wheelchair)?: A Little Help needed standing up from a chair using your arms (e.g., wheelchair or bedside chair)?: A Lot (cues) Help needed to walk in hospital room?: A Lot (cues) Help needed climbing 3-5 steps with a railing? : A Lot 6 Click Score: 15    End of Session Equipment Utilized During Treatment: Gait belt Activity Tolerance: Patient tolerated treatment well Patient left: in bed;with call bell/phone within reach;with bed alarm set;with family/visitor present Nurse Communication: Mobility status PT Visit Diagnosis: Other abnormalities of gait and mobility (R26.89);Muscle weakness (generalized) (M62.81);Difficulty in walking, not elsewhere classified (R26.2);Pain Pain - Right/Left: Left Pain - part of body: Hip     Time: 1020-1039 PT Time Calculation (min) (ACUTE ONLY): 19 min  Charges:  $Gait Training: 8-22 mins                     Abran Richard, PT Acute Rehab [facility] [place] 01/30/2022, 12:15 PM

## 2022-01-30 NOTE — TOC Transition Note (Signed)
Transition of Care [facility]) - CM/SW Discharge Note   Patient Details  Name: [name] MRN: [id number] Date of Birth: 04/03/1941  Transition of Care [facility] CM/SW Contact:  Joanne Chars, LCSW Phone Number: 01/30/2022, 11:06 AM   Clinical Narrative:   Pt discharging to [facility], room [place].  RN call report to [phone].     Final next level of care: Skilled Nursing Facility Barriers to Discharge: Barriers Resolved   Patient Goals and CMS Choice   CMS Medicare.gov Compare Post Acute Care list provided to:: Patient Choice offered to / list presented to : Patient, Spouse  Discharge Placement              Patient chooses bed at:  ([place]) Patient to be transferred to facility by: [place] Name of family member notified: [name] in room Patient and family notified of of transfer: 01/30/22  Discharge Plan and Services   Discharge Planning Services: CM Consult Post Acute Care Choice: [place]          DME Arranged:  (Patient reports she has a RW and cane at home, but may need a 3in1/BSC)         HH Arranged: PT [place]: [place]        Social Determinants of Health (SDOH) Interventions     Readmission Risk Interventions     No data to display

## 2022-01-30 NOTE — Assessment & Plan Note (Signed)
takes Famotidine, Pantoprazole 

## 2022-01-30 NOTE — Progress Notes (Signed)
Subjective: 7 Days Post-Op Procedure(s) (LRB): LEFT TOTAL HIP ARTHROPLASTY ANTERIOR APPROACH (Left) Patient reports pain as mild.    Objective: Vital signs in last 24 hours: Temp:  [97.9 F (36.6 C)-98.7 F (37.1 C)] 97.9 F (36.6 C) (10/02 0715) Pulse Rate:  [77-100] 100 (10/02 0715) Resp:  [18-19] 19 (10/02 0401) BP: (103-183)/(60-87) 175/87 (10/02 0715) SpO2:  [95 %-100 %] 96 % (10/02 0715)  Intake/Output from previous day: 10/01 0701 - 10/02 0700 In: 600 [P.O.:600] Out: 2450 [Urine:2450] Intake/Output this shift: No intake/output data recorded.  No results for input(s): "HGB" in the last 72 hours. No results for input(s): "WBC", "RBC", "HCT", "PLT" in the last 72 hours. Recent Labs    01/30/22 0756  CREATININE 1.83*   No results for input(s): "LABPT", "INR" in the last 72 hours.  Neurologically intact Neurovascular intact Sensation intact distally Intact pulses distally Dorsiflexion/Plantar flexion intact Incision: dressing C/D/I No cellulitis present Compartment soft   Assessment/Plan: 7 Days Post-Op Procedure(s) (LRB): LEFT TOTAL HIP ARTHROPLASTY ANTERIOR APPROACH (Left) Advance diet Up with therapy D/C IV fluids Continue foley due to acute urinary retention Discharge to SNF once insurance approves.  Hopeful for today D/C with foley.  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  Will continue Lovenox, but extend for a total of 4 weeks post-op      Aundra Dubin 01/30/2022, 9:42 AM

## 2022-01-30 NOTE — Progress Notes (Signed)
Location:   [facility]   Place of Service:    Provider: Lennie Odor Larnell Granlund NP  Plotnikov, Sherry Lacks, MD  Patient Care Team: Cassandria Anger, MD as PCP - General (Internal Medicine) Leonie Honestie Kulik, MD as PCP - Cardiology (Cardiology) Leonie Eilene Voigt, MD as Consulting Physician (Cardiology) Gavin Pound, MD as Consulting Physician (Rheumatology) Armbruster, Carlota Raspberry, MD as Consulting Physician (Gastroenterology) Tanda Rockers, MD as Consulting Physician (Pulmonary Disease) Grace Isaac, MD (Inactive) as Consulting Physician (Cardiothoracic Surgery) Aloha Gell, MD as Consulting Physician (Obstetrics and Gynecology) Alain Marion, Sherry Lacks, MD  Extended Emergency Contact Information Primary Emergency Contact: [name] Address: [address]           [place], [place] [address] [place] Phone: [phone] Mobile Phone: [phone] Relation: Spouse Secondary Emergency Contact: [name],[name] Address: [address] [place] [address] [place] Phone: [phone] Relation: Friend  Code Status: DNR Goals of care: Advanced Directive information    01/23/2022    7:43 AM  Advanced Directives  Does Patient Have a Medical Advance Directive? No  Would patient like information on creating a medical advance directive? No - Patient declined     No chief complaint on file.   HPI:  Pt is a 81 y.o. female seen today for an acute visit for medication review following  s/p total replacement of R+L hip  Hospitalized 01/23/22-01/30/22, 01/23/22 s/p total replacement of L hip, f/u Dr Erlinda Hong 02/07/22, on perioperative antibiotics from admission on ward-Cefadroxil. Lovenox for 28 days. Prn Percocet, Methocarbamol  Urinary retention, Foley, f/u Urology  DVT 01/28/22 [place] age indeterminate deep vein thrombosis  involving the right peroneal veins, on Lovenox.   HTN, takes Amlodipine, HCTZ, Metoprolol  HLD takes Atorvastatin,  LDL 74 09/07/20  Vit D deficiency, takes Vit D,   Anemia, take Vit B12, Hgb 11.0 01/24/22  Constipation, takes Colace  GERD, takes Famotidine, Pantoprazole  RA, takes Golimumab, Leflunomide, fx of knees, hands, wrists, cervical disc disease/fracture, takes Gabapentin,   Hypothyroidism, takes Levothyroxine, TSH 2.35 06/21/21  CAD s/p CABG x1, LIMA LAD for ostial LAD, EF wnl  DOE/COPD, prn Albuterol ACT, Stiolto, Triamcinolone nasal spray  CKD, creat 1.83 01/30/22  TIA/Stroke, on Atorvastatin  PMR, Hx of    Past Medical History:  Diagnosis Date   Abdominal aortic aneurysm ([place])    REPAIRED IN 1996 BY [name]  AND HAS RECENTLY BEEN FOLLOWED BY [name]   Acquired asplenia     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 Most likely scar. Mild subendocardial ischemia. EF 71% LOW RISK.    Cervical disc disease    fracture   Chronic kidney disease    COPD (chronic obstructive pulmonary disease) (HCC)    Diverticulosis    Hyperlipidemia    Hypertension    Hypothyroidism (acquired)    hypo   Myocardial infarction ([place]) 11/2014   TIA   Polymyalgia rheumatica ([place])    2011 Dr. Charlestine Night   Rheumatoid arthritis [facility]) 2011   Dr.Truslow; fracture knees,  hands and wrists -    S/P CABG x 1 1996   CABG--LIMA-LAD for ostial LAD (not felt to be PCI amenable). 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 Atretic LIMA. Minimal disease the RCA and Circumflex system.   CARPAL TUNNEL RELEASE Left    CATARACT EXTRACTION Bilateral    CERVICAL SPINE SURGERY     plate 2008 Dr. Saintclair Halsted   COLONOSCOPY     CORONARY ARTERY BYPASS GRAFT  1996   INCLUDED AN INTERNAL MAMMARY ARTERY TO THE LAD. EF WAS NORMAL   INGUINAL HERNIA REPAIR Right    LAPAROSCOPIC APPENDECTOMY N/A 06/28/2016   Procedure: APPENDECTOMY LAPAROSCOPIC;  Surgeon: Leighton Ruff, MD;  Location: [facility];  Service: General;  Laterality: N/A;   NM MYOVIEW LTD  06/2010   Fixed anteroseptal, apical and inferoapical defect with moderate size. Most likely scar. Mild subendocardial ischemia. EF 71% LOW RISK.    SPLENECTOMY     TOTAL HIP ARTHROPLASTY Left 01/23/2022   Procedure: LEFT TOTAL HIP ARTHROPLASTY ANTERIOR APPROACH;  Surgeon: Leandrew Koyanagi, MD;  Location: [place];  Service: Orthopedics;  Laterality: Left;  3-C   TRANSTHORACIC ECHOCARDIOGRAM  12/2014   [facility]: Normal LV size & function. EF 55-60%,    vagina polyp     VIDEO ASSISTED THORACOSCOPY (VATS)/WEDGE RESECTION Left 03/02/2015   Procedure: VIDEO ASSISTED THORACOSCOPY (VATS), MINI THORACOTOMY, LEFT UPPER LOBE WEDGE, TAKE DOWN OF INTERNAL MAMMARY LESIONS, PLACEMENT OF ON-Q PUMP;  Surgeon: Grace Isaac, MD;  Location: [place];  Service: Thoracic;  Laterality: Left;   VIDEO BRONCHOSCOPY N/A 03/02/2015   Procedure: BRONCHOSCOPY;  Surgeon: Grace Isaac, MD;  Location: [place];  Service: Thoracic;  Laterality: N/A;   VIDEO BRONCHOSCOPY WITH ENDOBRONCHIAL NAVIGATION N/A 10/08/2017   Procedure: VIDEO BRONCHOSCOPY WITH ENDOBRONCHIAL NAVIGATION WITH BIOPSIES OF LEFT UPPER LOBE AND LEFT LOWER LOBE;  Surgeon: Grace Isaac, MD;  Location: [place];  Service: Thoracic;  Laterality: N/A;    Allergies   Allergen Reactions   Nsaids Other (See Comments)    Pt states all NSAIDS does not sit well onstomach   Aspirin Other (See Comments)    Hurts stomach   Codeine Nausea And Vomiting   Nitroglycerin Other (See Comments)    Heart rate drops    Allergies as of 01/30/2022       Reactions   Nsaids Other (See Comments)   Pt states all NSAIDS does not sit well onstomach   Aspirin Other (See Comments)   Hurts stomach   Codeine Nausea And Vomiting   Nitroglycerin Other (See Comments)   Heart rate drops        Medication List        Accurate as of January 30, 2022  4:41 PM. If you have any questions, ask your nurse or doctor.          albuterol 108 (90 Base) MCG/ACT inhaler Commonly known as: VENTOLIN HFA Inhale 2 puffs into the lungs every  4 (four) hours as needed for wheezing or shortness of breath.   amLODipine 2.5 MG tablet Commonly known as: NORVASC Take 1 tablet (2.5 mg total) by mouth daily.   atorvastatin 80 MG tablet Commonly known as: LIPITOR TAKE 1 TABLET EVERY DAY   B-12 2500 MCG Tabs one po qd What changed:  how much to take how to take this when to take this additional instructions   cefadroxil 500 MG capsule Commonly known as: DURICEF Take 1 capsule (500 mg total) by mouth 2 (two) times daily.   CENTRUM ADULT PO Take by mouth. 1 Tablet Daily.   docusate sodium 100 MG capsule Commonly known as: Colace Take 1 capsule (100 mg total) by mouth daily as needed.   enoxaparin 40 MG/0.4ML injection Commonly known as: LOVENOX Inject 0.4 mLs (40 mg total) into the skin daily for 28 days.   famotidine 20 MG tablet Commonly known as: PEPCID TAKE 1 TABLET AT BEDTIME   gabapentin 300 MG capsule Commonly known as: NEURONTIN TAKE 1 CAPSULE BY MOUTH THREE TIMES A DAY What changed:  how much to take how to take this when to take this additional instructions   hydrochlorothiazide 12.5 MG tablet Commonly known as: HYDRODIURIL TAKE 1 TABLET BY MOUTH  EVERY DAY   leflunomide 20 MG tablet Commonly known as: ARAVA Take 20 mg by mouth daily.   levothyroxine 88 MCG tablet Commonly known as: SYNTHROID TAKE 1 TABLET (88 MCG TOTAL) BY MOUTH DAILY BEFORE BREAKFAST. What changed: how much to take   loperamide 2 MG tablet Commonly known as: IMODIUM A-D Take 2 mg by mouth 4 (four) times daily as needed for diarrhea or loose stools.   methocarbamol 500 MG tablet Commonly known as: ROBAXIN Take 1 tablet (500 mg total) by mouth 2 (two) times daily as needed.   metoprolol tartrate 50 MG tablet Commonly known as: LOPRESSOR Take 1.5 tablets (75 mg total) by mouth 2 (two) times daily.   ondansetron 4 MG tablet Commonly known as: Zofran Take 1 tablet (4 mg total) by mouth every 8 (eight) hours as needed for nausea or vomiting.   oxyCODONE-acetaminophen 5-325 MG tablet Commonly known as: Percocet Take 1-2 tablets by mouth every 6 (six) hours as needed. To be taken after surgery   pantoprazole 40 MG tablet Commonly known as: PROTONIX TAKE 1 TABLET DAILY. TAKE 30 TO 60 MINUTES BEFORE FIRST MEAL OF THE DAY What changed: See the new instructions.   SIMPONI Edgar every 2 (two) months. Every other month per Dr Jamie Brookes Respimat 2.5-2.5 MCG/ACT Aers Generic drug: Tiotropium Bromide-Olodaterol INHALE 2 PUFFS BY MOUTH INTO THE LUNGS DAILY   triamcinolone 55 MCG/ACT Aero nasal inhaler Commonly known as: NASACORT Place 1 spray into the nose at bedtime.   Vitamin D3 50 MCG (2000 UT) capsule Take 1 capsule (2,000 Units total) by mouth daily.        Review of Systems  Constitutional:  Positive for activity change, appetite change and fatigue. Negative for fever.  HENT:  Negative for congestion and trouble swallowing.   Eyes:  Negative for visual disturbance.  Respiratory:  Positive for shortness of breath. Negative for cough and wheezing.        DOE  Cardiovascular:  Positive for leg swelling.  Gastrointestinal:  Negative for  abdominal pain, constipation, nausea and vomiting.  Genitourinary:  Positive for difficulty urinating.       Foley  Musculoskeletal:  Positive for arthralgias, back pain, gait problem and joint swelling.  Skin:  Positive for wound. Negative for color change.  Neurological:  Negative for tremors and headaches.  Psychiatric/Behavioral:  Negative for behavioral problems and sleep disturbance. The patient is not nervous/anxious.     Immunization History  Administered Date(s) Administered   Fluad Quad(high Dose 65+) 02/03/2021, 01/16/2022   Influenza Whole 05/02/2001, 01/27/2008, 01/12/2010, 12/31/2010, 01/25/2012   Influenza, High Dose Seasonal PF 01/14/2014, 02/13/2015, 12/21/2016, 01/09/2018, 12/29/2018   Influenza,inj,Quad PF,6+ Mos 12/23/2015   Influenza-Unspecified 02/03/2013, 01/13/2014, 02/13/2015, 01/20/2020   Meningococcal Polysaccharide 03/21/2012   Moderna SARS-COV2 Booster Vaccination 03/15/2020   Moderna Sars-Covid-2 Vaccination 06/02/2019, 06/30/2019   PPD Test 11/18/2013   Pneumococcal Conjugate-13 03/20/2013   Pneumococcal Polysaccharide-23 05/02/2000, 06/23/2010, 02/26/2018   Td 05/01/2006   Tdap 06/01/2016   Zoster Recombinat (Shingrix) 05/16/2017, 07/16/2017   Zoster, Live 01/17/2006   Pertinent  Health Maintenance Due  Topic Date Due   COLONOSCOPY (Pts 45-58yr Insurance coverage will need to be confirmed)  03/30/2019   INFLUENZA VACCINE  Completed   DEXA SCAN  Completed      01/28/2022    8:00 AM 01/28/2022    9:30 PM 01/29/2022    9:00 AM 01/29/2022   11:00 PM 01/30/2022   10:00 AM  Fall Risk  Patient Fall Risk Level High fall risk High fall risk High fall risk High fall risk High fall risk   Functional Status Survey:    There were no vitals filed for this visit. There is no height or weight on file to calculate BMI. Physical Exam HENT:     Head: Normocephalic and atraumatic.     Nose: Nose normal.     Mouth/Throat:     Mouth: Mucous membranes are  moist.  Eyes:     Extraocular Movements: Extraocular movements intact.     Conjunctiva/sclera: Conjunctivae normal.     Pupils: Pupils are equal, round, and reactive to light.  Cardiovascular:     Rate and Rhythm: Normal rate and regular rhythm.     Heart sounds: No murmur heard. Pulmonary:     Effort: Pulmonary effort is normal.     Breath sounds: No wheezing or rhonchi.  Abdominal:     General: Bowel sounds are normal.     Palpations: Abdomen is soft.     Tenderness: There is no abdominal tenderness.  Musculoskeletal:        General: Tenderness present.     Cervical back: Normal range of motion and neck supple.     Right lower leg: Edema present.     Left lower leg: Edema present.     Comments: Moderate edema LLE, trace edema RLE  Skin:    General: Skin is warm and dry.     Comments: L hip surgical incisions covered in dressing.   Neurological:     General: No focal deficit present.     Mental Status: She is alert and oriented to person, place, and time. Mental status is at baseline.     Motor: No weakness.     Gait: Gait abnormal.  Psychiatric:        Mood and Affect: Mood normal.        Behavior: Behavior normal.        Thought Content: Thought content normal.     Labs reviewed: Recent Labs    12/06/21 0948 01/03/22 0952 01/23/22 0644 01/30/22 0756  NA 139 141 134*  --   K 4.5 4.2 4.3  --   CL 101 102 97*  --  CO2 '26 29 26  '$ --   GLUCOSE 105* 95 90  --   BUN 53* 54* 65*  --   CREATININE 2.51* 2.22* 2.71* 1.83*  CALCIUM 10.4 10.6* 9.8  --    Recent Labs    11/16/21 2356 12/06/21 0948 01/03/22 0952  AST '17 17 19  '$ ALT '14 10 12  '$ ALKPHOS 68 70 72  BILITOT 0.7 0.6 0.6  PROT 6.7 6.7 6.9  ALBUMIN 3.6 3.9 3.8   Recent Labs    12/06/21 0948 01/03/22 0952 01/23/22 0644 01/24/22 0859  WBC 8.5 8.6 9.5 13.5*  NEUTROABS 3.6 4.0  --   --   HGB 11.0* 12.2 12.2 11.0*  HCT 32.6* 37.1 36.3 33.0*  MCV 97.3 99.0 97.3 97.3  PLT 314.0 349.0 323 293   Lab  Results  Component Value Date   TSH 2.35 06/21/2021   No results found for: "HGBA1C" Lab Results  Component Value Date   CHOL 157 09/07/2020   HDL 59.30 09/07/2020   LDLCALC 74 09/07/2020   LDLDIRECT 162.1 10/28/2009   TRIG 121.0 09/07/2020   CHOLHDL 3 09/07/2020    Significant Diagnostic Results in last 30 days:  VAS [place] LOWER EXTREMITY VENOUS (DVT)  Result Date: 01/28/2022  Lower Venous DVT Study Patient Name:  [name]  Date of Exam:   01/28/2022 Medical Rec #: [id number]       Accession #:    [id number] Date of Birth: [birth year]        Patient Gender: F Patient Age:   45 years Exam Location:  [facility] Procedure:      VAS [place] LOWER EXTREMITY VENOUS (DVT) Referring Phys: Gloriann Loan --------------------------------------------------------------------------------  Indications: Edema, and Swelling.  Comparison Study: no prior Performing Technologist: Archie Patten RVS  Examination Guidelines: A complete evaluation includes B-mode imaging, spectral Doppler, color Doppler, and power Doppler as needed of all accessible portions of each vessel. Bilateral testing is considered an integral part of a complete examination. Limited examinations for reoccurring indications may be performed as noted. The reflux portion of the exam is performed with the patient in reverse Trendelenburg.  +--------+---------------+---------+-----------+----------+--------------------+ RIGHT   CompressibilityPhasicitySpontaneityPropertiesThrombus Aging       +--------+---------------+---------+-----------+----------+--------------------+ CFV     Full           Yes      Yes                                       +--------+---------------+---------+-----------+----------+--------------------+ SFJ     Full                                                              +--------+---------------+---------+-----------+----------+--------------------+ FV Prox Full                                                               +--------+---------------+---------+-----------+----------+--------------------+ FV Mid  Full                                                              +--------+---------------+---------+-----------+----------+--------------------+  FV      Full                                                              Distal                                                                    +--------+---------------+---------+-----------+----------+--------------------+ PFV     Full                                                              +--------+---------------+---------+-----------+----------+--------------------+ POP     Full           Yes      Yes                                       +--------+---------------+---------+-----------+----------+--------------------+ PTV     Full                                                              +--------+---------------+---------+-----------+----------+--------------------+ PERO    None                                         in a single paired                                                        vein                 +--------+---------------+---------+-----------+----------+--------------------+   +---------+---------------+---------+-----------+----------+--------------+ LEFT     CompressibilityPhasicitySpontaneityPropertiesThrombus Aging +---------+---------------+---------+-----------+----------+--------------+ CFV      Full           Yes      Yes                                 +---------+---------------+---------+-----------+----------+--------------+ SFJ      Full                                                        +---------+---------------+---------+-----------+----------+--------------+ FV Prox  Full                                                        +---------+---------------+---------+-----------+----------+--------------+  FV Mid   Full                                                         +---------+---------------+---------+-----------+----------+--------------+ FV DistalFull                                                        +---------+---------------+---------+-----------+----------+--------------+ PFV      Full                                                        +---------+---------------+---------+-----------+----------+--------------+ POP      Full           Yes      Yes                                 +---------+---------------+---------+-----------+----------+--------------+ PTV      Full                                                        +---------+---------------+---------+-----------+----------+--------------+ PERO     Full                                                        +---------+---------------+---------+-----------+----------+--------------+     Summary: RIGHT: - 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 - A cystic structure is found in the popliteal fossa.  LEFT: - There is no evidence of deep vein thrombosis in the lower extremity.  - No cystic structure found in the popliteal fossa.  *See table(s) above for measurements and observations. Electronically signed by Deitra Mayo MD on 01/28/2022 at 4:00:42 PM.    Final    DG Pelvis Portable  Result Date: 01/23/2022 CLINICAL DATA:  Postop left hip arthroplasty. EXAM: PORTABLE PELVIS 1-2 VIEWS COMPARISON:  Left hip radiographs 10/06/2021 FINDINGS: Sequelae of interval left total hip arthroplasty are identified. Gas is noted in the adjacent soft tissues. No acute fracture or dislocation is evident on this single image. There is mild superior hip joint space narrowing on the right. IMPRESSION: Left total hip arthroplasty without evidence of acute osseous abnormality. Electronically Signed   By: Logan Bores M.D.   On: 01/23/2022 10:43   DG HIP UNILAT WITH PELVIS  1V LEFT  Result Date: 01/23/2022 CLINICAL DATA:  Fluoroscopy provided during hip replacement. Fluoroscopy time: 24 seconds. Cumulative air kerma: 2.49 mGy EXAM: DG HIP (WITH OR WITHOUT PELVIS) 1V*L* COMPARISON:  None Available. FINDINGS: Four images were obtained during left hip replacement. By the end of the study, the left hip is been replaced  in hardware is in good position on frontal imaging. IMPRESSION: Fluoroscopy provided during left hip replacement as above. Electronically Signed   By: Dorise Bullion III M.D.   On: 01/23/2022 09:07   DG C-Arm 1-60 Min-No Report  Result Date: 01/23/2022 Fluoroscopy was utilized by the requesting physician.  No radiographic interpretation.   DG C-Arm 1-60 Min-No Report  Result Date: 01/23/2022 Fluoroscopy was utilized by the requesting physician.  No radiographic interpretation.    Assessment/Plan: Rheumatoid arthritis ([place]) Hospitalized 01/23/22-01/30/22, 01/23/22 s/p total replacement of L hip, f/u Dr Erlinda Hong 02/07/22, on perioperative antibiotics from admission on ward-Cefadroxil. Lovenox for 28 days. Prn Percocet, Methocarbamol takes Golimumab, Leflunomide, Gabapentin,  fx of knees, hands, wrists, cervical disc disease/fracture  Urinary retention  Foley, f/u Urology  DVT (deep venous thrombosis) (HCC) DVT 01/28/22 [place] age indeterminate deep vein thrombosis  involving the right peroneal veins, on Lovenox.   Essential hypertension takes Amlodipine, HCTZ, Metoprolol for blood pressure control.   Dyslipidemia, goal LDL below 70 takes Atorvastatin, LDL 74 09/07/20  Vitamin D deficiency takes Vit D  Acute blood loss anemia (ABLA) take Vit B12, Hgb 11.0 01/24/22, f/u CBC/diff  Slow transit constipation  takes Colace, no BM for a few days,  will have MiraLax daily, senokot S nightly.   GERD (gastroesophageal reflux disease) takes Famotidine, Pantoprazole  Hypothyroidism takes Levothyroxine, TSH 2.35 06/21/21  Aortic atherosclerosis ([place]) s/p CABG  x1, LIMA LAD for ostial LAD, EF wnl  COPD GOLD I prn Albuterol ACT, Stiolto, Triamcinolone nasal spray  CKD (chronic kidney disease) stage 4, GFR 15-29 ml/min (HCC) creat 1.83 01/30/22, repeat BMP  TIA (transient ischemic attack) on Atorvastatin  Primary osteoarthritis of left hip L hip, f/u Dr Erlinda Hong 02/07/22, on perioperative antibiotics from admission on ward-Cefadroxil. Lovenox for 28 days. Prn Percocet, Methocarbamol    Family/ staff Communication: plan of care reviewed with the patient and charge nurse.   Labs/tests ordered:   CBC, BMP  Time spend 35 minutes.

## 2022-01-30 NOTE — Assessment & Plan Note (Signed)
takes Amlodipine, HCTZ, Metoprolol for blood pressure control.

## 2022-01-30 NOTE — Progress Notes (Signed)
Report given to [facility].  

## 2022-01-30 NOTE — Assessment & Plan Note (Signed)
prn Albuterol ACT, Stiolto, Triamcinolone nasal spray

## 2022-01-30 NOTE — Assessment & Plan Note (Addendum)
Hospitalized 01/23/22-01/30/22, 01/23/22 s/p total replacement of L hip, f/u Dr Erlinda Hong 02/07/22, on perioperative antibiotics from admission on ward-Cefadroxil. Lovenox for 28 days. Prn Percocet, Methocarbamol takes Golimumab, Leflunomide, Gabapentin,  fx of knees, hands, wrists, cervical disc disease/fracture

## 2022-01-30 NOTE — Assessment & Plan Note (Signed)
Foley, f/u Urology

## 2022-01-30 NOTE — Assessment & Plan Note (Signed)
takes Atorvastatin, LDL 74 09/07/20 

## 2022-01-30 NOTE — Assessment & Plan Note (Signed)
takes Levothyroxine, TSH 2.35 06/21/21 

## 2022-01-30 NOTE — Assessment & Plan Note (Signed)
on Atorvastatin 

## 2022-01-30 NOTE — Assessment & Plan Note (Signed)
L hip, f/u Dr Erlinda Hong 02/07/22, on perioperative antibiotics from admission on ward-Cefadroxil. Lovenox for 28 days. Prn Percocet, Methocarbamol

## 2022-01-30 NOTE — Assessment & Plan Note (Addendum)
takes Colace, no BM for a few days,  will have MiraLax daily, senokot S nightly.

## 2022-01-30 NOTE — Assessment & Plan Note (Signed)
creat 1.83 01/30/22, repeat BMP

## 2022-01-30 NOTE — Progress Notes (Signed)
Indwelling urinary catheter removed per MD orders. Pt tolerated well.

## 2022-01-30 NOTE — TOC Progression Note (Addendum)
Transition of Care [facility]) - Progression Note    Patient Details  Name: [name] MRN: [id number] Date of Birth: 1940-09-09  Transition of Care [facility]) CM/SW Contact  Joanne Chars, LCSW Phone Number: 01/30/2022, 9:47 AM  Clinical Narrative:    SNF auth approved: [id number], [id number], 5 days: 9/28-10/2.   CSW LM with Angela Nevin, [facility] and LM with Dr Erlinda Hong regarding pt DC to SNF today.   1010: [facility] [place], [facility] [place].  Auth info provided. She is ready to receive pt today.    Expected Discharge Plan: [place] Barriers to Discharge: Continued Medical Work up  Expected Discharge Plan and Services Expected Discharge Plan: [place]   Discharge Planning Services: CM Consult Post Acute Care Choice: [place] Living arrangements for the past 2 months: Apartment Expected Discharge Date: 01/28/22               DME Arranged:  (Patient reports she has a RW and cane at home, but may need a 3in1/BSC)         HH Arranged: PT [place]: [place]         Social Determinants of Health (SDOH) Interventions    Readmission Risk Interventions     No data to display

## 2022-01-30 NOTE — Assessment & Plan Note (Signed)
takes Vit D 

## 2022-01-30 NOTE — Plan of Care (Signed)
  Problem: Education: Goal: Knowledge of the prescribed therapeutic regimen will improve Outcome: Adequate for Discharge Goal: Understanding of discharge needs will improve Outcome: Adequate for Discharge Goal: Individualized Educational Video(s) Outcome: Adequate for Discharge   Problem: Activity: Goal: Ability to avoid complications of mobility impairment will improve Outcome: Adequate for Discharge Goal: Ability to tolerate increased activity will improve Outcome: Adequate for Discharge   Problem: Clinical Measurements: Goal: Postoperative complications will be avoided or minimized Outcome: Adequate for Discharge   Problem: Pain Management: Goal: Pain level will decrease with appropriate interventions Outcome: Adequate for Discharge   Problem: Skin Integrity: Goal: Will show signs of wound healing Outcome: Adequate for Discharge   Problem: Education: Goal: Knowledge of General Education information will improve Description: Including pain rating scale, medication(s)/side effects and non-pharmacologic comfort measures Outcome: Adequate for Discharge   Problem: Health Behavior/Discharge Planning: Goal: Ability to manage health-related needs will improve Outcome: Adequate for Discharge   Problem: Clinical Measurements: Goal: Ability to maintain clinical measurements within normal limits will improve Outcome: Adequate for Discharge Goal: Will remain free from infection Outcome: Adequate for Discharge Goal: Diagnostic test results will improve Outcome: Adequate for Discharge Goal: Respiratory complications will improve Outcome: Adequate for Discharge Goal: Cardiovascular complication will be avoided Outcome: Adequate for Discharge   Problem: Activity: Goal: Risk for activity intolerance will decrease Outcome: Adequate for Discharge   Problem: Nutrition: Goal: Adequate nutrition will be maintained Outcome: Adequate for Discharge   Problem: Coping: Goal: Level of  anxiety will decrease Outcome: Adequate for Discharge   Problem: Elimination: Goal: Will not experience complications related to bowel motility Outcome: Adequate for Discharge Goal: Will not experience complications related to urinary retention Outcome: Adequate for Discharge   Problem: Pain Managment: Goal: General experience of comfort will improve Outcome: Adequate for Discharge   Problem: Safety: Goal: Ability to remain free from injury will improve Outcome: Adequate for Discharge   Problem: Skin Integrity: Goal: Risk for impaired skin integrity will decrease Outcome: Adequate for Discharge   

## 2022-01-30 NOTE — Assessment & Plan Note (Signed)
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

## 2022-01-30 NOTE — Assessment & Plan Note (Signed)
s/p CABG x1, LIMA LAD for ostial LAD, EF wnl

## 2022-01-31 ENCOUNTER — Encounter: Payer: Self-pay | Admitting: Nurse Practitioner

## 2022-01-31 ENCOUNTER — Telehealth: Payer: Self-pay | Admitting: *Deleted

## 2022-01-31 ENCOUNTER — Non-Acute Institutional Stay (SKILLED_NURSING_FACILITY): Payer: Medicare PPO | Admitting: Family Medicine

## 2022-01-31 DIAGNOSIS — R269 Unspecified abnormalities of gait and mobility: Secondary | ICD-10-CM | POA: Diagnosis not present

## 2022-01-31 DIAGNOSIS — I10 Essential (primary) hypertension: Secondary | ICD-10-CM

## 2022-01-31 DIAGNOSIS — I700 Atherosclerosis of aorta: Secondary | ICD-10-CM

## 2022-01-31 DIAGNOSIS — M069 Rheumatoid arthritis, unspecified: Secondary | ICD-10-CM

## 2022-01-31 DIAGNOSIS — J449 Chronic obstructive pulmonary disease, unspecified: Secondary | ICD-10-CM | POA: Diagnosis not present

## 2022-01-31 DIAGNOSIS — R339 Retention of urine, unspecified: Secondary | ICD-10-CM | POA: Diagnosis not present

## 2022-01-31 DIAGNOSIS — N184 Chronic kidney disease, stage 4 (severe): Secondary | ICD-10-CM | POA: Diagnosis not present

## 2022-01-31 NOTE — Progress Notes (Signed)
Location:  [place]    Place of Service:  SNF (31)  Provider: Takoda Janowiak X Serina Nichter NP   Plotnikov, [name], MD  Patient Care Team: Cassandria Anger, MD as PCP - General (Internal Medicine) Leonie Emilija Bohman, MD as PCP - Cardiology (Cardiology) Leonie Casson Catena, MD as Consulting Physician (Cardiology) Gavin Pound, MD as Consulting Physician (Rheumatology) Armbruster, Carlota Raspberry, MD as Consulting Physician (Gastroenterology) Tanda Rockers, MD as Consulting Physician (Pulmonary Disease) Grace Isaac, MD (Inactive) as Consulting Physician (Cardiothoracic Surgery) Aloha Gell, MD as Consulting Physician (Obstetrics and Gynecology) Alain Marion, [name], MD  Extended Emergency Contact Information Primary Emergency Contact: [name] Address: [address]           [place], [place] [address] [place] Phone: [phone] Mobile Phone: [phone] Relation: Spouse Secondary Emergency Contact: [name],[name] Address: [address], [place] [address] [place] Phone: [phone] Relation: Friend  Code Status:   Goals of care: Advanced Directive information    01/31/2022    9:50 AM  Advanced Directives  Does Patient Have a Medical Advance Directive? No     Chief Complaint  Patient presents with   Acute Visit    Medication review following hospital stay.   Error    HPI:  Pt is a 81 y.o. 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 Most likely scar. Mild subendocardial ischemia. EF 71% LOW RISK.    Cervical disc disease    fracture   Chronic kidney disease    COPD (chronic obstructive pulmonary disease) (HCC)    Diverticulosis    Hyperlipidemia    Hypertension    Hypothyroidism (acquired)    hypo   Myocardial infarction ([place]) 11/2014   TIA   Polymyalgia rheumatica ([place])    2011 Dr. Charlestine Night   Rheumatoid arthritis [facility]) 2011   Dr.Truslow; fracture knees, hands and wrists -    S/P CABG x 1 1996   CABG--LIMA-LAD for ostial LAD (not felt to be PCI amenable). 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 Atretic LIMA. Minimal disease the RCA and Circumflex system.   CARPAL TUNNEL RELEASE Left    CATARACT EXTRACTION Bilateral    CERVICAL SPINE  SURGERY     plate 2008 Dr. Saintclair Halsted   COLONOSCOPY     CORONARY ARTERY BYPASS GRAFT  1996   INCLUDED AN INTERNAL MAMMARY ARTERY TO THE LAD. EF WAS NORMAL   INGUINAL HERNIA REPAIR Right    LAPAROSCOPIC APPENDECTOMY N/A 06/28/2016   Procedure: APPENDECTOMY LAPAROSCOPIC;  Surgeon: Leighton Ruff, MD;  Location: [facility];  Service: General;  Laterality: N/A;   NM MYOVIEW LTD  06/2010   Fixed anteroseptal, apical and inferoapical defect with moderate size. Most likely scar. Mild subendocardial ischemia. EF 71% LOW RISK.    SPLENECTOMY     TOTAL HIP ARTHROPLASTY Left 01/23/2022   Procedure: LEFT TOTAL HIP ARTHROPLASTY ANTERIOR  APPROACH;  Surgeon: Leandrew Koyanagi, MD;  Location: [facility];  Service: Orthopedics;  Laterality: Left;  3-C   TRANSTHORACIC ECHOCARDIOGRAM  12/2014   [facility]: Normal LV size & function. EF 55-60%,    vagina polyp     VIDEO ASSISTED THORACOSCOPY (VATS)/WEDGE RESECTION Left 03/02/2015   Procedure: VIDEO ASSISTED THORACOSCOPY (VATS), MINI THORACOTOMY, LEFT UPPER LOBE WEDGE, TAKE DOWN OF INTERNAL MAMMARY LESIONS, PLACEMENT OF ON-Q PUMP;  Surgeon: Grace Isaac, MD;  Location: [facility];  Service: Thoracic;  Laterality: Left;   VIDEO BRONCHOSCOPY N/A 03/02/2015   Procedure: BRONCHOSCOPY;  Surgeon: Grace Isaac, MD;  Location: [facility];  Service: Thoracic;  Laterality: N/A;   VIDEO BRONCHOSCOPY WITH ENDOBRONCHIAL NAVIGATION N/A 10/08/2017   Procedure: VIDEO BRONCHOSCOPY WITH ENDOBRONCHIAL NAVIGATION WITH BIOPSIES OF LEFT UPPER LOBE AND LEFT LOWER LOBE;  Surgeon: Grace Isaac, MD;  Location: [facility];  Service: Thoracic;  Laterality: N/A;    Allergies  Allergen Reactions   Nsaids Other (See Comments)    Pt states all NSAIDS does not sit well onstomach   Aspirin Other (See Comments)    Hurts stomach   Codeine Nausea And Vomiting   Nitroglycerin Other (See Comments)    Heart rate drops    Allergies as of 01/30/2022       Reactions   Nsaids Other (See Comments)   Pt states all NSAIDS does not sit well onstomach   Aspirin Other (See Comments)   Hurts stomach   Codeine Nausea And Vomiting   Nitroglycerin Other (See Comments)   Heart rate drops        Medication List      Notice   This visit is on the same day as an admission, and a visit start time could not be determined. If the visit took place after discharge, manually review the med list with the patient.     Review of Systems  Immunization History  Administered Date(s) Administered   Fluad Quad(high Dose 65+) 02/03/2021, 01/16/2022   Influenza Whole 05/02/2001, 01/27/2008, 01/12/2010, 12/31/2010, 01/25/2012    Influenza, High Dose Seasonal PF 01/14/2014, 02/13/2015, 12/21/2016, 01/09/2018, 12/29/2018   Influenza,inj,Quad PF,6+ Mos 12/23/2015   Influenza-Unspecified 02/03/2013, 01/13/2014, 02/13/2015, 01/20/2020   Meningococcal Polysaccharide 03/21/2012   Moderna SARS-COV2 Booster Vaccination 03/15/2020   Moderna Sars-Covid-2 Vaccination 06/02/2019, 06/30/2019   PPD Test 11/18/2013   Pfizer Covid-19 Vaccine Bivalent Booster 73yr & up 01/19/2021, 10/12/2021   Pneumococcal Conjugate-13 03/20/2013   Pneumococcal Polysaccharide-23 05/02/2000, 06/23/2010, 02/26/2018   Td 05/01/2006   Tdap 06/01/2016   Zoster Recombinat (Shingrix) 05/16/2017, 07/20/2017   Zoster, Live 01/17/2006   Pertinent  Health Maintenance Due  Topic Date Due   COLONOSCOPY (Pts 45-454yrInsurance coverage will need to be confirmed)  03/30/2019   INFLUENZA VACCINE  Completed  DEXA SCAN  Completed      01/28/2022    8:00 AM 01/28/2022    9:30 PM 01/29/2022    9:00 AM 01/29/2022   11:00 PM 01/30/2022   10:00 AM  Fall Risk  Patient Fall Risk Level High fall risk High fall risk High fall risk High fall risk High fall risk   Functional Status Survey:    Vitals:   01/31/22 0912  BP: 114/82  Pulse: 63  Resp: 18  Temp: 97.7 F (36.5 C)  SpO2: 96%  Weight: 151 lb 3.2 oz (68.6 kg)  Height: '5\' 3"'$  (1.6 m)   Body mass index is 26.78 kg/m. Physical Exam  Labs reviewed: Recent Labs    12/06/21 0948 01/03/22 0952 01/23/22 0644 01/30/22 0756  NA 139 141 134*  --   K 4.5 4.2 4.3  --   CL 101 102 97*  --   CO2 '26 29 26  '$ --   GLUCOSE 105* 95 90  --   BUN 53* 54* 65*  --   CREATININE 2.51* 2.22* 2.71* 1.83*  CALCIUM 10.4 10.6* 9.8  --    Recent Labs    11/16/21 2356 12/06/21 0948 01/03/22 0952  AST '17 17 19  '$ ALT '14 10 12  '$ ALKPHOS 68 70 72  BILITOT 0.7 0.6 0.6  PROT 6.7 6.7 6.9  ALBUMIN 3.6 3.9 3.8   Recent Labs    12/06/21 0948 01/03/22 0952 01/23/22 0644 01/24/22 0859  WBC 8.5 8.6 9.5 13.5*   NEUTROABS 3.6 4.0  --   --   HGB 11.0* 12.2 12.2 11.0*  HCT 32.6* 37.1 36.3 33.0*  MCV 97.3 99.0 97.3 97.3  PLT 314.0 349.0 323 293   Lab Results  Component Value Date   TSH 2.35 06/21/2021   No results found for: "HGBA1C" Lab Results  Component Value Date   CHOL 157 09/07/2020   HDL 59.30 09/07/2020   LDLCALC 74 09/07/2020   LDLDIRECT 162.1 10/28/2009   TRIG 121.0 09/07/2020   CHOLHDL 3 09/07/2020    Significant Diagnostic Results in last 30 days:  VAS [place] LOWER EXTREMITY VENOUS (DVT)  Result Date: 01/28/2022  Lower Venous DVT Study Patient Name:  ALANA DAYTON  Date of Exam:   01/28/2022 Medical Rec #: [id number]       Accession #:    [id number] Date of Birth: 1941/04/17        Patient Gender: F Patient Age:   71 years Exam Location:  [facility] Procedure:      VAS [place] LOWER EXTREMITY VENOUS (DVT) Referring Phys: Gloriann Loan --------------------------------------------------------------------------------  Indications: Edema, and Swelling.  Comparison Study: no prior Performing Technologist: Archie Patten RVS  Examination Guidelines: A complete evaluation includes B-mode imaging, spectral Doppler, color Doppler, and power Doppler as needed of all accessible portions of each vessel. Bilateral testing is considered an integral part of a complete examination. Limited examinations for reoccurring indications may be performed as noted. The reflux portion of the exam is performed with the patient in reverse Trendelenburg.  +--------+---------------+---------+-----------+----------+--------------------+ RIGHT   CompressibilityPhasicitySpontaneityPropertiesThrombus Aging       +--------+---------------+---------+-----------+----------+--------------------+ CFV     Full           Yes      Yes                                       +--------+---------------+---------+-----------+----------+--------------------+ SFJ  Full                                                               +--------+---------------+---------+-----------+----------+--------------------+ FV Prox Full                                                              +--------+---------------+---------+-----------+----------+--------------------+ FV Mid  Full                                                              +--------+---------------+---------+-----------+----------+--------------------+ FV      Full                                                              Distal                                                                    +--------+---------------+---------+-----------+----------+--------------------+ PFV     Full                                                              +--------+---------------+---------+-----------+----------+--------------------+ POP     Full           Yes      Yes                                       +--------+---------------+---------+-----------+----------+--------------------+ PTV     Full                                                              +--------+---------------+---------+-----------+----------+--------------------+ PERO    None                                         in a single paired  vein                 +--------+---------------+---------+-----------+----------+--------------------+   +---------+---------------+---------+-----------+----------+--------------+ LEFT     CompressibilityPhasicitySpontaneityPropertiesThrombus Aging +---------+---------------+---------+-----------+----------+--------------+ CFV      Full           Yes      Yes                                 +---------+---------------+---------+-----------+----------+--------------+ SFJ      Full                                                        +---------+---------------+---------+-----------+----------+--------------+ FV Prox  Full                                                         +---------+---------------+---------+-----------+----------+--------------+ FV Mid   Full                                                        +---------+---------------+---------+-----------+----------+--------------+ FV DistalFull                                                        +---------+---------------+---------+-----------+----------+--------------+ PFV      Full                                                        +---------+---------------+---------+-----------+----------+--------------+ POP      Full           Yes      Yes                                 +---------+---------------+---------+-----------+----------+--------------+ PTV      Full                                                        +---------+---------------+---------+-----------+----------+--------------+ PERO     Full                                                        +---------+---------------+---------+-----------+----------+--------------+     Summary: RIGHT: - 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 - A cystic structure is found  in the popliteal fossa.  LEFT: - There is no evidence of deep vein thrombosis in the lower extremity.  - No cystic structure found in the popliteal fossa.  *See table(s) above for measurements and observations. Electronically signed by Deitra Mayo MD on 01/28/2022 at 4:00:42 PM.    Final    DG Pelvis Portable  Result Date: 01/23/2022 CLINICAL DATA:  Postop left hip arthroplasty. EXAM: PORTABLE PELVIS 1-2 VIEWS COMPARISON:  Left hip radiographs 10/06/2021 FINDINGS: Sequelae of interval left total hip arthroplasty are identified. Gas is noted in the adjacent soft tissues. No acute fracture or dislocation is evident on this single image. There is mild superior hip joint space narrowing on the right. IMPRESSION: Left total hip arthroplasty  without evidence of acute osseous abnormality. Electronically Signed   By: Logan Bores M.D.   On: 01/23/2022 10:43   DG HIP UNILAT WITH PELVIS 1V LEFT  Result Date: 01/23/2022 CLINICAL DATA:  Fluoroscopy provided during hip replacement. Fluoroscopy time: 24 seconds. Cumulative air kerma: 2.49 mGy EXAM: DG HIP (WITH OR WITHOUT PELVIS) 1V*L* COMPARISON:  None Available. FINDINGS: Four images were obtained during left hip replacement. By the end of the study, the left hip is been replaced in hardware is in good position on frontal imaging. IMPRESSION: Fluoroscopy provided during left hip replacement as above. Electronically Signed   By: Dorise Bullion III M.D.   On: 01/23/2022 09:07   DG C-Arm 1-60 Min-No Report  Result Date: 01/23/2022 Fluoroscopy was utilized by the requesting physician.  No radiographic interpretation.   DG C-Arm 1-60 Min-No Report  Result Date: 01/23/2022 Fluoroscopy was utilized by the requesting physician.  No radiographic interpretation.    Assessment/Plan There are no diagnoses linked to this encounter.   Family/ staff Communication:   Labs/tests ordered:   This encounter was created in error - please disregard.

## 2022-01-31 NOTE — Telephone Encounter (Signed)
Combined D/C call and 7 day call attempted to patient. No answer and left VM requesting call back.

## 2022-01-31 NOTE — Progress Notes (Signed)
Provider:  Alain Honey, MD Location:      Place of Service:     PCP: Cassandria Anger, MD Patient Care Team: Cassandria Anger, MD as PCP - General (Internal Medicine) Leonie Man, MD as PCP - Cardiology (Cardiology) Leonie Man, MD as Consulting Physician (Cardiology) Gavin Pound, MD as Consulting Physician (Rheumatology) Armbruster, Carlota Raspberry, MD as Consulting Physician (Gastroenterology) Tanda Rockers, MD as Consulting Physician (Pulmonary Disease) Grace Isaac, MD (Inactive) as Consulting Physician (Cardiothoracic Surgery) Aloha Gell, MD as Consulting Physician (Obstetrics and Gynecology) Plotnikov, [name], MD  Extended Emergency Contact Information Primary Emergency Contact: [name] Address: [address]           [place], [place] [address] [place] Phone: [phone] Mobile Phone: [phone] Relation: Spouse Secondary Emergency Contact: [name],[name] Address: [address], [place] [address] [place] Phone: [phone] Relation: Friend  Code Status:  Goals of Care: Advanced Directive information    01/23/2022    7:43 AM  Advanced Directives  Does Patient Have a Medical Advance Directive? No  Would patient like information on creating a medical advance directive? No - Patient declined      No chief complaint on file.   HPI: Patient is a 81 y.o. female seen today for admission to [facility] for rehab following L hip arthroplasty.  Patient was hospitalized from September 25 to October 2 for left hip arthroplasty.  S postoperatively patient developed urinary retention and urology was consulted.  She is discharged with Foley catheter in place to follow-up with urology Physical therapy has already started.  Plan will be to discharge back to friend's home when asked where she has lived for the past 8 years in independent living. She voices no  specific complaints today.  Pain is being managed with oxycodone and muscle relaxant.  She does complain of some constipation and she is on a bowel regimen including MiraLAX and senna Past history includes chronic kidney disease, COPD, hyperlipidemia, hypertension, hypothyroidism.  She is status post MI with CABG in 2005.  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 Most likely scar. Mild subendocardial ischemia. EF 71% LOW RISK.    Cervical disc disease    fracture   Chronic kidney disease    COPD (chronic obstructive pulmonary disease) (HCC)    Diverticulosis    Hyperlipidemia    Hypertension    Hypothyroidism (acquired)    hypo   Myocardial infarction ([place]) 11/2014   TIA   Polymyalgia rheumatica ([place])    2011 Dr. Charlestine Night   Rheumatoid arthritis [facility]) 2011   Dr.Truslow; fracture knees, hands and wrists -    S/P CABG x 1 1996   CABG--LIMA-LAD for ostial LAD (not felt to be PCI amenable). EF NORMAL then;  LIMA now atretic   Shortness of breath dyspnea    with exertion   Stroke ([place]) 11/2014   TIA    Urinary frequency    Past Surgical History:  Procedure Laterality Date   ABDOMINAL AORTIC ANEURYSM REPAIR  4709   Complicated by mesenteric artery stenosis and splenic artery infarction with acquired Asplenia   APPENDECTOMY     BUNIONECTOMY  07/2011   right foot   CARDIAC CATHETERIZATION  2005   (Most recent CATH) - ostial LAD lesion 20-30% (down from 90%  initially). Atretic LIMA. Minimal disease the RCA and Circumflex system.   CARPAL TUNNEL RELEASE Left    CATARACT EXTRACTION Bilateral    CERVICAL SPINE SURGERY     plate 2008 Dr. Saintclair Halsted   COLONOSCOPY     CORONARY ARTERY BYPASS GRAFT  1996   INCLUDED AN INTERNAL MAMMARY ARTERY TO THE LAD. EF WAS NORMAL   INGUINAL HERNIA REPAIR Right    LAPAROSCOPIC APPENDECTOMY N/A 06/28/2016   Procedure: APPENDECTOMY LAPAROSCOPIC;  Surgeon: Leighton Ruff, MD;  Location: [facility];  Service: General;  Laterality: N/A;   NM MYOVIEW LTD  06/2010   Fixed anteroseptal, apical and inferoapical defect with moderate size. Most likely scar. Mild subendocardial ischemia. EF 71% LOW RISK.    SPLENECTOMY     TOTAL HIP ARTHROPLASTY Left 01/23/2022   Procedure: LEFT TOTAL HIP ARTHROPLASTY ANTERIOR APPROACH;  Surgeon: Leandrew Koyanagi, MD;  Location: [facility];  Service: Orthopedics;  Laterality: Left;  3-C   TRANSTHORACIC ECHOCARDIOGRAM  12/2014   [facility]: Normal LV size & function. EF 55-60%,    vagina polyp     VIDEO ASSISTED THORACOSCOPY (VATS)/WEDGE RESECTION Left 03/02/2015   Procedure: VIDEO ASSISTED THORACOSCOPY (VATS), MINI THORACOTOMY, LEFT UPPER LOBE WEDGE, TAKE DOWN OF INTERNAL MAMMARY LESIONS, PLACEMENT OF ON-Q PUMP;  Surgeon: Grace Isaac, MD;  Location: [facility];  Service: Thoracic;  Laterality: Left;   VIDEO BRONCHOSCOPY N/A 03/02/2015   Procedure: BRONCHOSCOPY;  Surgeon: Grace Isaac, MD;  Location: [facility];  Service: Thoracic;  Laterality: N/A;   VIDEO BRONCHOSCOPY WITH ENDOBRONCHIAL NAVIGATION N/A 10/08/2017   Procedure: VIDEO BRONCHOSCOPY WITH ENDOBRONCHIAL NAVIGATION WITH BIOPSIES OF LEFT UPPER LOBE AND LEFT LOWER LOBE;  Surgeon: Grace Isaac, MD;  Location: [facility];  Service: Thoracic;  Laterality: N/A;    reports that she quit smoking about 63 years ago. Her smoking use included cigarettes. She has never been exposed to tobacco smoke. She has never used smokeless tobacco. She reports  current alcohol use. She reports that she does not use drugs. Social History   Socioeconomic History   Marital status: Married    Spouse name: Not on file   Number of children: 4   Years of education: Not on file   Highest education level: Not on file  Occupational History   Occupation: retired    Fish farm manager: RETIRED  Tobacco Use   Smoking status: Former    Types: Cigarettes    Quit date: 12/04/1958    Years since quitting: 63.2    Passive exposure: Never   Smokeless tobacco: Never  Vaping Use   Vaping Use: Never used  Substance and Sexual Activity   Alcohol use: Yes    Comment: hardly ever   Drug use: No   Sexual activity: Not Currently  Other Topics Concern   Not on file  Social History Narrative   [facility]  [place] ; [address] [place].  Living at [place] since dec 2015   Social Determinants of Health   Financial Resource Strain: Low Risk  (05/30/2021)   Overall Financial Resource Strain (CARDIA)    Difficulty of Paying Living Expenses: Not hard at all  Food Insecurity: No Food Insecurity (01/23/2022)   Hunger Vital Sign    Worried About Running Out of Food in the Last Year: Never true    Ran Out of Food in the Last Year: Never true  Transportation Needs: No Transportation Needs (01/23/2022)   PRAPARE - Hydrologist (Medical): No    Lack of Transportation (Non-Medical): No  Physical Activity: Insufficiently Active (05/30/2021)   Exercise Vital Sign    Days of Exercise per Week: 3 days    Minutes of Exercise per Session: 30 min  Stress: No Stress Concern Present (05/30/2021)   [place]    Feeling of Stress : Not at all  Social Connections: Moderately Integrated (05/30/2021)   Social Connection and Isolation Panel [NHANES]    Frequency of Communication with Friends and Family: Twice a week    Frequency of Social Gatherings with  Friends and Family: Twice a week    Attends Religious Services: Never    Marine scientist or Organizations: Yes    Attends Music therapist: More than 4 times per year    Marital Status: Married  Human resources officer Violence: Not At Risk (01/23/2022)   Humiliation, Afraid, Rape, and Kick questionnaire    Fear of Current or Ex-Partner: No    Emotionally Abused: No    Physically Abused: No    Sexually Abused: No    Functional Status Survey:    Family History  Problem Relation Age of Onset   Hypertension Mother    Diabetes Mother    Heart disease Mother    Hyperlipidemia Mother    Stroke Father    Hyperlipidemia Sister    Hypertension Sister    Hypertension Daughter    Cancer Paternal Uncle        Deceased from cancer not sure of site   Hypertension Son    Hyperlipidemia Son    Hyperlipidemia Son    Hypertension Son    Hyperlipidemia Son    Hypertension Son    Hypertension Other    Coronary artery disease Other    Asthma Neg Hx    Colon cancer Neg Hx     Health Maintenance  Topic Date Due   COLONOSCOPY (Pts 45-28yr Insurance coverage will need to be confirmed)  03/30/2019   COVID-19 Vaccine (3 - Moderna risk series) 04/12/2020   TETANUS/TDAP  06/01/2026   Pneumonia Vaccine 81 Years old  Completed   INFLUENZA VACCINE  Completed   DEXA SCAN  Completed   Zoster Vaccines- Shingrix  Completed   HPV VACCINES  Aged Out    Allergies  Allergen Reactions   Nsaids Other (See Comments)    Pt states all NSAIDS does not sit well onstomach   Aspirin Other (See Comments)    Hurts stomach   Codeine Nausea And Vomiting   Nitroglycerin Other (See Comments)    Heart rate drops    Outpatient Encounter Medications as of 01/31/2022  Medication Sig   amLODipine (NORVASC) 2.5 MG tablet Take 1 tablet (2.5 mg total) by mouth daily.   cefadroxil (DURICEF) 500 MG capsule Take 1 capsule (500 mg total) by mouth 2 (two) times daily.   Cholecalciferol (  VITAMIN D3) 50  MCG (2000 UT) capsule Take 1 capsule (2,000 Units total) by mouth daily.   enoxaparin (LOVENOX) 40 MG/0.4ML injection Inject 0.4 mLs (40 mg total) into the skin daily for 28 days.   Golimumab (SIMPONI [place]) every 2 (two) months. Every other month per Dr Trudie Reed   hydrochlorothiazide (HYDRODIURIL) 12.5 MG tablet TAKE 1 TABLET BY MOUTH EVERY DAY (Patient taking differently: Take 12.5 mg by mouth daily.)   leflunomide (ARAVA) 20 MG tablet Take 20 mg by mouth daily.    levothyroxine (SYNTHROID) 88 MCG tablet TAKE 1 TABLET (88 MCG TOTAL) BY MOUTH DAILY BEFORE BREAKFAST. (Patient taking differently: Take 44 mcg by mouth daily before breakfast.)   loperamide (IMODIUM A-D) 2 MG tablet Take 2 mg by mouth 4 (four) times daily as needed for diarrhea or loose stools.   methocarbamol (ROBAXIN) 500 MG tablet Take 1 tablet (500 mg total) by mouth 2 (two) times daily as needed.   Multiple Vitamins-Minerals (CENTRUM ADULT PO) Take by mouth. 1 Tablet Daily.   No facility-administered encounter medications on file as of 01/31/2022.    Review of Systems  Constitutional: Negative.   Respiratory:  Positive for shortness of breath.   Cardiovascular: Negative.   Gastrointestinal:  Positive for constipation.  Genitourinary:  Positive for difficulty urinating.  Musculoskeletal:  Positive for arthralgias and gait problem.  Psychiatric/Behavioral: Negative.    All other systems reviewed and are negative.   There were no vitals filed for this visit. There is no height or weight on file to calculate BMI. Physical Exam Vitals and nursing note reviewed.  Constitutional:      Appearance: Normal appearance.  HENT:     Head: Normocephalic.  Eyes:     Extraocular Movements: Extraocular movements intact.     Pupils: Pupils are equal, round, and reactive to light.  Cardiovascular:     Rate and Rhythm: Normal rate and regular rhythm.     Heart sounds: Normal heart sounds.  Pulmonary:     Effort: Pulmonary effort is  normal.     Breath sounds: Normal breath sounds.  Abdominal:     General: Abdomen is flat. Bowel sounds are normal. There is distension.  Musculoskeletal:     Cervical back: Normal range of motion.     Comments: Left hip dressing in place. Patient receiving PT and walking with aid of walker  Skin:    General: Skin is dry.  Neurological:     General: No focal deficit present.     Mental Status: She is alert and oriented to person, place, and time.  Psychiatric:        Mood and Affect: Mood normal.        Behavior: Behavior normal.     Labs reviewed: Basic Metabolic Panel: Recent Labs    12/06/21 0948 01/03/22 0952 01/23/22 0644 01/30/22 0756  NA 139 141 134*  --   K 4.5 4.2 4.3  --   CL 101 102 97*  --   CO2 '26 29 26  '$ --   GLUCOSE 105* 95 90  --   BUN 53* 54* 65*  --   CREATININE 2.51* 2.22* 2.71* 1.83*  CALCIUM 10.4 10.6* 9.8  --    Liver Function Tests: Recent Labs    11/16/21 2356 12/06/21 0948 01/03/22 0952  AST '17 17 19  '$ ALT '14 10 12  '$ ALKPHOS 68 70 72  BILITOT 0.7 0.6 0.6  PROT 6.7 6.7 6.9  ALBUMIN 3.6 3.9 3.8   No  results for input(s): "LIPASE", "AMYLASE" in the last 8760 hours. No results for input(s): "AMMONIA" in the last 8760 hours. CBC: Recent Labs    12/06/21 0948 01/03/22 0952 01/23/22 0644 01/24/22 0859  WBC 8.5 8.6 9.5 13.5*  NEUTROABS 3.6 4.0  --   --   HGB 11.0* 12.2 12.2 11.0*  HCT 32.6* 37.1 36.3 33.0*  MCV 97.3 99.0 97.3 97.3  PLT 314.0 349.0 323 293   Cardiac Enzymes: No results for input(s): "CKTOTAL", "CKMB", "CKMBINDEX", "TROPONINI" in the last 8760 hours. BNP: Invalid input(s): "POCBNP" No results found for: "HGBA1C" Lab Results  Component Value Date   TSH 2.35 06/21/2021   Lab Results  Component Value Date   VITAMINB12 524 11/13/2018   Lab Results  Component Value Date   FOLATE 11.5 09/04/2012   Lab Results  Component Value Date   IRON 99 12/06/2021   TIBC 310 12/06/2021   FERRITIN 86 12/06/2021     Imaging and Procedures obtained prior to SNF admission: DG Pelvis Portable  Result Date: 01/23/2022 CLINICAL DATA:  Postop left hip arthroplasty. EXAM: PORTABLE PELVIS 1-2 VIEWS COMPARISON:  Left hip radiographs 10/06/2021 FINDINGS: Sequelae of interval left total hip arthroplasty are identified. Gas is noted in the adjacent soft tissues. No acute fracture or dislocation is evident on this single image. There is mild superior hip joint space narrowing on the right. IMPRESSION: Left total hip arthroplasty without evidence of acute osseous abnormality. Electronically Signed   By: Logan Bores M.D.   On: 01/23/2022 10:43   DG HIP UNILAT WITH PELVIS 1V LEFT  Result Date: 01/23/2022 CLINICAL DATA:  Fluoroscopy provided during hip replacement. Fluoroscopy time: 24 seconds. Cumulative air kerma: 2.49 mGy EXAM: DG HIP (WITH OR WITHOUT PELVIS) 1V*L* COMPARISON:  None Available. FINDINGS: Four images were obtained during left hip replacement. By the end of the study, the left hip is been replaced in hardware is in good position on frontal imaging. IMPRESSION: Fluoroscopy provided during left hip replacement as above. Electronically Signed   By: Dorise Bullion III M.D.   On: 01/23/2022 09:07   DG C-Arm 1-60 Min-No Report  Result Date: 01/23/2022 Fluoroscopy was utilized by the requesting physician.  No radiographic interpretation.   DG C-Arm 1-60 Min-No Report  Result Date: 01/23/2022 Fluoroscopy was utilized by the requesting physician.  No radiographic interpretation.    Assessment/Plan 1. Aortic atherosclerosis (HCC) Status post repair of aneurysm.  Current treatment includes atorvastatin, several BP medicines.  2. CKD (chronic kidney disease) stage 4, GFR 15-29 ml/min [facility] Patient is followed by nephrology.  She has been told she may be heading toward dialysis  3. COPD GOLD I Followed by pulmonary; does have a right lower lobe nodule that is being followed with sequential CT. uses several  inhalers   4. Essential hypertension Blood pressure appears to be well controlled although there were some elevations while hospitalized  5. Gait disorder Patient working with physical therapy.  Using walker  6. Rheumatoid arthritis involving multiple sites, unspecified whether rheumatoid factor present ([place]) Followed by rheumatology.  Only medication for pain is Tylenol at this point  7. Urinary retention Plan is to get her back to urology for cath removal.  Depending on when appointment can be made may want to consider removal prior although patient does not want to be catheterized again if she cannot void    Family/ staff Communication: Discussed care with her husband who was present  Labs/tests ordered:  Lillette Boxer. Sabra Heck, MD [facility]  Care [address] [place], [place] Office [phone]

## 2022-02-01 ENCOUNTER — Non-Acute Institutional Stay (SKILLED_NURSING_FACILITY): Payer: Medicare PPO | Admitting: Adult Health

## 2022-02-01 ENCOUNTER — Encounter: Payer: Self-pay | Admitting: Adult Health

## 2022-02-01 DIAGNOSIS — M1612 Unilateral primary osteoarthritis, left hip: Secondary | ICD-10-CM | POA: Diagnosis not present

## 2022-02-01 DIAGNOSIS — R339 Retention of urine, unspecified: Secondary | ICD-10-CM

## 2022-02-01 DIAGNOSIS — I10 Essential (primary) hypertension: Secondary | ICD-10-CM | POA: Diagnosis not present

## 2022-02-01 NOTE — Progress Notes (Signed)
Location:  [facility] Room Number: NO/65/A Place of Service:  SNF (31) Provider:  Durenda Age, DNP, FNP-BC  Patient Care Team: Cassandria Anger, MD as PCP - General (Internal Medicine) Leonie Man, MD as PCP - Cardiology (Cardiology) Leonie Man, MD as Consulting Physician (Cardiology) Gavin Pound, MD as Consulting Physician (Rheumatology) Armbruster, Carlota Raspberry, MD as Consulting Physician (Gastroenterology) Tanda Rockers, MD as Consulting Physician (Pulmonary Disease) Grace Isaac, MD (Inactive) as Consulting Physician (Cardiothoracic Surgery) Aloha Gell, MD as Consulting Physician (Obstetrics and Gynecology) Alain Marion, [name], MD  Extended Emergency Contact Information Primary Emergency Contact: [name] Address: [place], [place] [address] [place] Phone: [phone] Mobile Phone: [phone] Relation: Spouse Secondary Emergency Contact: [name],[name] Address: [address], [place] [address] [place] Phone: [phone] Relation: Friend  Code Status:  FULL  Goals of care: Advanced Directive information    01/31/2022    9:50 AM  Advanced Directives  Does Patient Have a Medical Advance Directive? No     Chief Complaint  Patient presents with   Acute Visit    Patient is being seen for urinary retention    HPI:  Pt is a 81 y.o. female seen today for an acute visit regarding urinary retention. She was admitted to [facility] on 01/30/22 post hospitalization 01/23/22 to 01/30/22 for osteoarthritis of the left hip s/p left total hip arthroplasty anterior approach on9/25/2023.  Hospitalization was complicated by urinary retention for which she had Foley catheter insertion.  She was discharged with Foley catheter.  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 Most likely scar. Mild subendocardial ischemia. EF 71% LOW RISK.    Cervical disc disease    fracture   Chronic kidney disease    COPD (chronic obstructive pulmonary disease) (HCC)    Diverticulosis    Hyperlipidemia    Hypertension    Hypothyroidism (acquired)    hypo   Myocardial infarction ([place]) 11/2014   TIA   Polymyalgia rheumatica ([place])    2011 Dr. Charlestine Night   Rheumatoid arthritis [facility]) 2011   Dr.Truslow; fracture knees, hands and wrists -    S/P CABG x 1 1996   CABG--LIMA-LAD for ostial LAD (not felt to be PCI amenable). EF NORMAL then; LIMA now atretic   Shortness of breath dyspnea    with exertion   Stroke ([place]) 11/2014   TIA    Urinary frequency    Past Surgical History:  Procedure Laterality Date   ABDOMINAL AORTIC ANEURYSM REPAIR  2500   Complicated by mesenteric artery stenosis and splenic artery infarction with acquired Asplenia   APPENDECTOMY     BUNIONECTOMY  07/2011  right foot   CARDIAC CATHETERIZATION  2005   (Most recent CATH) - ostial LAD lesion 20-30% (down from 90% initially). Atretic LIMA. Minimal disease the RCA and Circumflex system.   CARPAL TUNNEL RELEASE Left    CATARACT EXTRACTION Bilateral    CERVICAL SPINE SURGERY     plate 2008 Dr. Saintclair Halsted   COLONOSCOPY     CORONARY ARTERY BYPASS GRAFT  1996   INCLUDED AN INTERNAL MAMMARY ARTERY TO THE LAD. EF WAS NORMAL   INGUINAL HERNIA REPAIR Right    LAPAROSCOPIC APPENDECTOMY N/A  06/28/2016   Procedure: APPENDECTOMY LAPAROSCOPIC;  Surgeon: Leighton Ruff, MD;  Location: [facility];  Service: General;  Laterality: N/A;   NM MYOVIEW LTD  06/2010   Fixed anteroseptal, apical and inferoapical defect with moderate size. Most likely scar. Mild subendocardial ischemia. EF 71% LOW RISK.    SPLENECTOMY     TOTAL HIP ARTHROPLASTY Left 01/23/2022   Procedure: LEFT TOTAL HIP ARTHROPLASTY ANTERIOR APPROACH;  Surgeon: Leandrew Koyanagi, MD;  Location: [facility];  Service: Orthopedics;  Laterality: Left;  3-C   TRANSTHORACIC ECHOCARDIOGRAM  12/2014   [facility]: Normal LV size & function. EF 55-60%,    vagina polyp     VIDEO ASSISTED THORACOSCOPY (VATS)/WEDGE RESECTION Left 03/02/2015   Procedure: VIDEO ASSISTED THORACOSCOPY (VATS), MINI THORACOTOMY, LEFT UPPER LOBE WEDGE, TAKE DOWN OF INTERNAL MAMMARY LESIONS, PLACEMENT OF ON-Q PUMP;  Surgeon: Grace Isaac, MD;  Location: [facility];  Service: Thoracic;  Laterality: Left;   VIDEO BRONCHOSCOPY N/A 03/02/2015   Procedure: BRONCHOSCOPY;  Surgeon: Grace Isaac, MD;  Location: [facility];  Service: Thoracic;  Laterality: N/A;   VIDEO BRONCHOSCOPY WITH ENDOBRONCHIAL NAVIGATION N/A 10/08/2017   Procedure: VIDEO BRONCHOSCOPY WITH ENDOBRONCHIAL NAVIGATION WITH BIOPSIES OF LEFT UPPER LOBE AND LEFT LOWER LOBE;  Surgeon: Grace Isaac, MD;  Location: [facility];  Service: Thoracic;  Laterality: N/A;    Allergies  Allergen Reactions   Nsaids Other (See Comments)    Pt states all NSAIDS does not sit well onstomach   Aspirin Other (See Comments)    Hurts stomach   Codeine Nausea And Vomiting   Nitroglycerin Other (See Comments)    Heart rate drops    Outpatient Encounter Medications as of 02/01/2022  Medication Sig   albuterol (VENTOLIN HFA) 108 (90 Base) MCG/ACT inhaler Inhale 1-2 puffs into the lungs every 4 (four) hours as needed for wheezing or shortness of breath.   amLODipine (NORVASC) 2.5 MG tablet Take 1 tablet (2.5 mg total) by mouth  daily.   atorvastatin (LIPITOR) 80 MG tablet Take 80 mg by mouth daily.   cefadroxil (DURICEF) 500 MG capsule Take 1 capsule (500 mg total) by mouth 2 (two) times daily.   Cholecalciferol (VITAMIN D3) 50 MCG (2000 UT) capsule Take 1 capsule (2,000 Units total) by mouth daily.   Cyanocobalamin 2500 MCG TABS Take 1 tablet by mouth daily.   docusate sodium (COLACE) 100 MG capsule Take 100 mg by mouth 2 (two) times daily.   enoxaparin (LOVENOX) 40 MG/0.4ML injection Inject 0.4 mLs (40 mg total) into the skin daily for 28 days.   famotidine (PEPCID) 20 MG tablet Take 20 mg by mouth 2 (two) times daily.   gabapentin (NEURONTIN) 300 MG capsule Take 300 mg by mouth 3 (three) times daily.   Golimumab (SIMPONI [place]) every 2 (two) months. Every other month per Dr Trudie Reed   hydrochlorothiazide (HYDRODIURIL) 12.5 MG tablet TAKE 1 TABLET BY MOUTH EVERY DAY (Patient  taking differently: Take 12.5 mg by mouth daily.)   leflunomide (ARAVA) 20 MG tablet Take 20 mg by mouth daily.    levothyroxine (SYNTHROID) 88 MCG tablet TAKE 1 TABLET (88 MCG TOTAL) BY MOUTH DAILY BEFORE BREAKFAST. (Patient taking differently: Take 44 mcg by mouth daily before breakfast.)   loperamide (IMODIUM A-D) 2 MG tablet Take 2 mg by mouth 4 (four) times daily as needed for diarrhea or loose stools.   methocarbamol (ROBAXIN) 500 MG tablet Take 1 tablet (500 mg total) by mouth 2 (two) times daily as needed.   metoprolol tartrate (LOPRESSOR) 50 MG tablet Take 50 mg by mouth 2 (two) times daily.   Multiple Vitamins-Minerals (CENTRUM ADULT PO) Take by mouth. 1 Tablet Daily.   ondansetron (ZOFRAN) 4 MG tablet Take 4 mg by mouth every 8 (eight) hours as needed for nausea or vomiting.   oxyCODONE-acetaminophen (PERCOCET/ROXICET) 5-325 MG tablet Take 1 tablet by mouth every 6 (six) hours as needed for severe pain.   pantoprazole (PROTONIX) 40 MG tablet Take 40 mg by mouth daily.   Tiotropium Bromide-Olodaterol (STIOLTO RESPIMAT) 2.5-2.5 MCG/ACT AERS  Inhale 2 each into the lungs at bedtime. 2 puffs Daily into Lungs.   triamcinolone (NASACORT) 55 MCG/ACT AERO nasal inhaler Place 1 spray into the nose daily.   No facility-administered encounter medications on file as of 02/01/2022.    Review of Systems  Constitutional:  Negative for appetite change, chills, fatigue and fever.  HENT:  Negative for congestion, hearing loss, rhinorrhea and sore throat.   Eyes: Negative.   Respiratory:  Negative for cough, shortness of breath and wheezing.   Cardiovascular:  Negative for chest pain, palpitations and leg swelling.  Gastrointestinal:  Negative for abdominal pain, constipation, diarrhea, nausea and vomiting.  Genitourinary:  Negative for dysuria.  Musculoskeletal:  Negative for arthralgias, back pain and myalgias.  Skin:  Negative for color change, rash and wound.  Neurological:  Negative for dizziness, weakness and headaches.  Psychiatric/Behavioral:  Negative for behavioral problems. The patient is not nervous/anxious.     Immunization History  Administered Date(s) Administered   Fluad Quad(high Dose 65+) 02/03/2021, 01/16/2022   Influenza Whole 05/02/2001, 01/27/2008, 01/12/2010, 12/31/2010, 01/25/2012   Influenza, High Dose Seasonal PF 01/14/2014, 02/13/2015, 12/21/2016, 01/09/2018, 12/29/2018   Influenza,inj,Quad PF,6+ Mos 12/23/2015   Influenza-Unspecified 02/03/2013, 01/13/2014, 02/13/2015, 01/20/2020   Meningococcal Polysaccharide 03/21/2012   Moderna SARS-COV2 Booster Vaccination 03/15/2020   Moderna Sars-Covid-2 Vaccination 06/02/2019, 06/30/2019   PPD Test 11/18/2013   Pfizer Covid-19 Vaccine Bivalent Booster 63yr & up 01/19/2021, 10/12/2021   Pneumococcal Conjugate-13 03/20/2013   Pneumococcal Polysaccharide-23 05/02/2000, 06/23/2010, 02/26/2018   Td 05/01/2006   Tdap 06/01/2016   Zoster Recombinat (Shingrix) 05/16/2017, 07/20/2017   Zoster, Live 01/17/2006   Pertinent  Health Maintenance Due  Topic Date Due    COLONOSCOPY (Pts 45-432yrInsurance coverage will need to be confirmed)  03/30/2019   INFLUENZA VACCINE  Completed   DEXA SCAN  Completed      01/28/2022    8:00 AM 01/28/2022    9:30 PM 01/29/2022    9:00 AM 01/29/2022   11:00 PM 01/30/2022   10:00 AM  Fall Risk  Patient Fall Risk Level High fall risk High fall risk High fall risk High fall risk High fall risk     Vitals:   02/01/22 1443  BP: 120/66  Pulse: 92  Resp: 18  Temp: 97.7 F (36.5 C)  SpO2: 93%  Weight: 159 lb (72.1 kg)  Height: '5\' 3"'$  (1.6  m)   Body mass index is 28.17 kg/m.  Physical Exam Constitutional:      Appearance: Normal appearance.  HENT:     Head: Normocephalic and atraumatic.     Nose: Nose normal.     Mouth/Throat:     Mouth: Mucous membranes are moist.  Eyes:     Conjunctiva/sclera: Conjunctivae normal.  Cardiovascular:     Rate and Rhythm: Normal rate and regular rhythm.  Pulmonary:     Effort: Pulmonary effort is normal.     Breath sounds: Normal breath sounds.  Abdominal:     General: Bowel sounds are normal.     Palpations: Abdomen is soft.  Genitourinary:    Comments: Foley catheter with foley cathetert french 14, intact, draining to urine bag with clear yellowish urine. Musculoskeletal:        General: Normal range of motion.     Cervical back: Normal range of motion.  Skin:    General: Skin is warm and dry.     Comments: Left hip surgical incision is dry, covered with dressing.  Neurological:     General: No focal deficit present.     Mental Status: She is alert. Mental status is at baseline.  Psychiatric:        Mood and Affect: Mood normal.        Behavior: Behavior normal.        Thought Content: Thought content normal.        Judgment: Judgment normal.      Labs reviewed: Recent Labs    12/06/21 0948 01/03/22 0952 01/23/22 0644 01/30/22 0756  NA 139 141 134*  --   K 4.5 4.2 4.3  --   CL 101 102 97*  --   CO2 '26 29 26  '$ --   GLUCOSE 105* 95 90  --   BUN 53* 54*  65*  --   CREATININE 2.51* 2.22* 2.71* 1.83*  CALCIUM 10.4 10.6* 9.8  --    Recent Labs    11/16/21 2356 12/06/21 0948 01/03/22 0952  AST '17 17 19  '$ ALT '14 10 12  '$ ALKPHOS 68 70 72  BILITOT 0.7 0.6 0.6  PROT 6.7 6.7 6.9  ALBUMIN 3.6 3.9 3.8   Recent Labs    12/06/21 0948 01/03/22 0952 01/23/22 0644 01/24/22 0859  WBC 8.5 8.6 9.5 13.5*  NEUTROABS 3.6 4.0  --   --   HGB 11.0* 12.2 12.2 11.0*  HCT 32.6* 37.1 36.3 33.0*  MCV 97.3 99.0 97.3 97.3  PLT 314.0 349.0 323 293   Lab Results  Component Value Date   TSH 2.35 06/21/2021   No results found for: "HGBA1C" Lab Results  Component Value Date   CHOL 157 09/07/2020   HDL 59.30 09/07/2020   LDLCALC 74 09/07/2020   LDLDIRECT 162.1 10/28/2009   TRIG 121.0 09/07/2020   CHOLHDL 3 09/07/2020    Significant Diagnostic Results in last 30 days:  VAS [place] LOWER EXTREMITY VENOUS (DVT)  Result Date: 01/28/2022  Lower Venous DVT Study Patient Name:  ZAYLA AGAR  Date of Exam:   01/28/2022 Medical Rec #: [id number]       Accession #:    [id number] Date of Birth: 20-May-1940        Patient Gender: F Patient Age:   10 years Exam [facility]  [facility] Procedure:      VAS [place] LOWER EXTREMITY VENOUS (DVT) Referring Phys: Gloriann Loan --------------------------------------------------------------------------------  Indications: Edema, and Swelling.  Comparison Study: no prior  Performing Technologist: Archie Patten RVS  Examination Guidelines: A complete evaluation includes B-mode imaging, spectral Doppler, color Doppler, and power Doppler as needed of all accessible portions of each vessel. Bilateral testing is considered an integral part of a complete examination. Limited examinations for reoccurring indications may be performed as noted. The reflux portion of the exam is performed with the patient in reverse Trendelenburg.  +--------+---------------+---------+-----------+----------+--------------------+ RIGHT    CompressibilityPhasicitySpontaneityPropertiesThrombus Aging       +--------+---------------+---------+-----------+----------+--------------------+ CFV     Full           Yes      Yes                                       +--------+---------------+---------+-----------+----------+--------------------+ SFJ     Full                                                              +--------+---------------+---------+-----------+----------+--------------------+ FV Prox Full                                                              +--------+---------------+---------+-----------+----------+--------------------+ FV Mid  Full                                                              +--------+---------------+---------+-----------+----------+--------------------+ FV      Full                                                              Distal                                                                    +--------+---------------+---------+-----------+----------+--------------------+ PFV     Full                                                              +--------+---------------+---------+-----------+----------+--------------------+ POP     Full           Yes      Yes                                       +--------+---------------+---------+-----------+----------+--------------------+  PTV     Full                                                              +--------+---------------+---------+-----------+----------+--------------------+ PERO    None                                         in a single paired                                                        vein                 +--------+---------------+---------+-----------+----------+--------------------+   +---------+---------------+---------+-----------+----------+--------------+ LEFT     CompressibilityPhasicitySpontaneityPropertiesThrombus Aging  +---------+---------------+---------+-----------+----------+--------------+ CFV      Full           Yes      Yes                                 +---------+---------------+---------+-----------+----------+--------------+ SFJ      Full                                                        +---------+---------------+---------+-----------+----------+--------------+ FV Prox  Full                                                        +---------+---------------+---------+-----------+----------+--------------+ FV Mid   Full                                                        +---------+---------------+---------+-----------+----------+--------------+ FV DistalFull                                                        +---------+---------------+---------+-----------+----------+--------------+ PFV      Full                                                        +---------+---------------+---------+-----------+----------+--------------+ POP      Full           Yes      Yes                                 +---------+---------------+---------+-----------+----------+--------------+  PTV      Full                                                        +---------+---------------+---------+-----------+----------+--------------+ PERO     Full                                                        +---------+---------------+---------+-----------+----------+--------------+     Summary: RIGHT: - 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 - A cystic structure is found in the popliteal fossa.  LEFT: - There is no evidence of deep vein thrombosis in the lower extremity.  - No cystic structure found in the popliteal fossa.  *See table(s) above for measurements and observations. Electronically signed by Deitra Mayo MD on 01/28/2022 at 4:00:42 PM.    Final    DG Pelvis Portable  Result Date:  01/23/2022 CLINICAL DATA:  Postop left hip arthroplasty. EXAM: PORTABLE PELVIS 1-2 VIEWS COMPARISON:  Left hip radiographs 10/06/2021 FINDINGS: Sequelae of interval left total hip arthroplasty are identified. Gas is noted in the adjacent soft tissues. No acute fracture or dislocation is evident on this single image. There is mild superior hip joint space narrowing on the right. IMPRESSION: Left total hip arthroplasty without evidence of acute osseous abnormality. Electronically Signed   By: Logan Bores M.D.   On: 01/23/2022 10:43   DG HIP UNILAT WITH PELVIS 1V LEFT  Result Date: 01/23/2022 CLINICAL DATA:  Fluoroscopy provided during hip replacement. Fluoroscopy time: 24 seconds. Cumulative air kerma: 2.49 mGy EXAM: DG HIP (WITH OR WITHOUT PELVIS) 1V*L* COMPARISON:  None Available. FINDINGS: Four images were obtained during left hip replacement. By the end of the study, the left hip is been replaced in hardware is in good position on frontal imaging. IMPRESSION: Fluoroscopy provided during left hip replacement as above. Electronically Signed   By: Dorise Bullion III M.D.   On: 01/23/2022 09:07   DG C-Arm 1-60 Min-No Report  Result Date: 01/23/2022 Fluoroscopy was utilized by the requesting physician.  No radiographic interpretation.   DG C-Arm 1-60 Min-No Report  Result Date: 01/23/2022 Fluoroscopy was utilized by the requesting physician.  No radiographic interpretation.    Assessment/Plan  1. Urinary retention -  continue Cefadroxil while on foley catheter for prophylaxis -  foley catheter care daily -  urology consult   2. Primary osteoarthritis of left hip -  S/P total hip replacement on 01/23/2022 -   Continue Percocet PRN for pain, Robaxin PRN for muscle spasm -   Follow-up with orthopedics  3. Essential hypertension -Blood pressure well controlled Continue current medications   Family/ staff Communication: Discussed plan of care with resident and charge nurse.  Labs/tests  ordered:  None    Durenda Age, DNP, MSN, FNP-BC [facility] [phone] (Monday-Friday 8:00 a.m. - 5:00 p.m.) [phone] (after hours)

## 2022-02-03 ENCOUNTER — Non-Acute Institutional Stay (SKILLED_NURSING_FACILITY): Payer: Medicare PPO | Admitting: Nurse Practitioner

## 2022-02-03 ENCOUNTER — Encounter: Payer: Self-pay | Admitting: Nurse Practitioner

## 2022-02-03 DIAGNOSIS — E559 Vitamin D deficiency, unspecified: Secondary | ICD-10-CM

## 2022-02-03 DIAGNOSIS — K625 Hemorrhage of anus and rectum: Secondary | ICD-10-CM | POA: Diagnosis not present

## 2022-02-03 DIAGNOSIS — N184 Chronic kidney disease, stage 4 (severe): Secondary | ICD-10-CM

## 2022-02-03 DIAGNOSIS — M1612 Unilateral primary osteoarthritis, left hip: Secondary | ICD-10-CM | POA: Diagnosis not present

## 2022-02-03 DIAGNOSIS — E785 Hyperlipidemia, unspecified: Secondary | ICD-10-CM

## 2022-02-03 DIAGNOSIS — I824Z1 Acute embolism and thrombosis of unspecified deep veins of right distal lower extremity: Secondary | ICD-10-CM

## 2022-02-03 DIAGNOSIS — I700 Atherosclerosis of aorta: Secondary | ICD-10-CM

## 2022-02-03 DIAGNOSIS — J449 Chronic obstructive pulmonary disease, unspecified: Secondary | ICD-10-CM

## 2022-02-03 DIAGNOSIS — D62 Acute posthemorrhagic anemia: Secondary | ICD-10-CM

## 2022-02-03 DIAGNOSIS — K219 Gastro-esophageal reflux disease without esophagitis: Secondary | ICD-10-CM

## 2022-02-03 DIAGNOSIS — K5901 Slow transit constipation: Secondary | ICD-10-CM

## 2022-02-03 DIAGNOSIS — R339 Retention of urine, unspecified: Secondary | ICD-10-CM | POA: Diagnosis not present

## 2022-02-03 DIAGNOSIS — I10 Essential (primary) hypertension: Secondary | ICD-10-CM

## 2022-02-03 DIAGNOSIS — E034 Atrophy of thyroid (acquired): Secondary | ICD-10-CM

## 2022-02-03 DIAGNOSIS — G459 Transient cerebral ischemic attack, unspecified: Secondary | ICD-10-CM

## 2022-02-03 DIAGNOSIS — M069 Rheumatoid arthritis, unspecified: Secondary | ICD-10-CM

## 2022-02-03 NOTE — Progress Notes (Signed)
Location:   SNF [facility] Room Number: 22 Place of Service:  SNF (31) Provider: Lennie Odor Verdun Rackley NP  Plotnikov, [name], MD  Patient Care Team: Cassandria Anger, MD as PCP - General (Internal Medicine) Leonie Vencent Hauschild, MD as PCP - Cardiology (Cardiology) Leonie Tyliek Timberman, MD as Consulting Physician (Cardiology) Gavin Pound, MD as Consulting Physician (Rheumatology) Armbruster, Carlota Raspberry, MD as Consulting Physician (Gastroenterology) Tanda Rockers, MD as Consulting Physician (Pulmonary Disease) Grace Isaac, MD (Inactive) as Consulting Physician (Cardiothoracic Surgery) Aloha Gell, MD as Consulting Physician (Obstetrics and Gynecology) Alain Marion, [name], MD  Extended Emergency Contact Information Primary Emergency Contact: [name] Address: [address]           [place], [place] [address] [place] Phone: [phone] Mobile Phone: [phone] Relation: Spouse Secondary Emergency Contact: [name],[name] Address: [address], [place] [address] [place] Phone: [phone] Relation: Friend  Code Status: DNR Goals of care: Advanced Directive information    01/31/2022    9:50 AM  Advanced Directives  Does Patient Have a Medical Advance Directive? No     Chief Complaint  Patient presents with   Acute Visit    Bright red rectal bleed    HPI:  Pt is a 81 y.o. female seen today for an acute visit for bright red blood per rectal noted, denied pain in abd or rectal region, noted hemorrhoids, form stools.    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 Lovenox for 28 days. Prn Percocet, Methocarbamol             Urinary retention, Foley, f/u Urology             DVT 01/28/22 [place] age indeterminate deep vein thrombosis  involving the right peroneal veins, on Lovenox.              HTN, takes Amlodipine,  HCTZ, Metoprolol             HLD takes Atorvastatin, LDL 74 09/07/20             Vit D deficiency, takes Vit D,              Anemia, take Vit B12, Hgb 11.0 01/24/22             Constipation, takes Colace, MiraLax, Senokot S             GERD, takes Famotidine, Pantoprazole             RA, takes Golimumab, Leflunomide, fx of knees, hands, wrists, cervical disc disease/fracture, takes Gabapentin,              Hypothyroidism, takes Levothyroxine, TSH 2.35 06/21/21             CAD s/p CABG x1, LIMA LAD for ostial LAD, EF wnl. 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 Most likely scar. Mild subendocardial ischemia. EF 71% LOW RISK.    Cervical disc disease    fracture   Chronic kidney disease    COPD (chronic obstructive pulmonary disease) (HCC)    Diverticulosis    Hyperlipidemia    Hypertension    Hypothyroidism (acquired)    hypo   Myocardial infarction ([place]) 11/2014   TIA   Polymyalgia rheumatica ([place])    2011 Dr. Charlestine Night   Rheumatoid arthritis [facility]) 2011   Dr.Truslow; fracture knees, hands and wrists -    S/P CABG x 1 1996   CABG--LIMA-LAD for ostial LAD (not felt to be PCI amenable). 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 Atretic LIMA. Minimal disease the RCA and Circumflex system.   CARPAL TUNNEL RELEASE Left    CATARACT EXTRACTION Bilateral    CERVICAL SPINE SURGERY     plate 2008 Dr. Saintclair Halsted   COLONOSCOPY     CORONARY ARTERY BYPASS GRAFT  1996   INCLUDED AN INTERNAL MAMMARY ARTERY TO THE LAD. EF WAS NORMAL   INGUINAL HERNIA REPAIR Right    LAPAROSCOPIC APPENDECTOMY N/A 06/28/2016   Procedure: APPENDECTOMY LAPAROSCOPIC;  Surgeon: Leighton Ruff, MD;  Location: [facility];  Service: General;  Laterality: N/A;   NM MYOVIEW LTD  06/2010   Fixed anteroseptal, apical and inferoapical defect with moderate size. Most likely scar. Mild subendocardial ischemia. EF 71% LOW RISK.    SPLENECTOMY     TOTAL HIP ARTHROPLASTY Left 01/23/2022   Procedure: LEFT TOTAL HIP ARTHROPLASTY ANTERIOR APPROACH;  Surgeon: Leandrew Koyanagi, MD;  Location: [place];  Service: Orthopedics;  Laterality: Left;  3-C   TRANSTHORACIC ECHOCARDIOGRAM  12/2014   [facility]: Normal LV size & function. EF 55-60%,    vagina polyp     VIDEO ASSISTED THORACOSCOPY (VATS)/WEDGE RESECTION Left 03/02/2015   Procedure: VIDEO ASSISTED THORACOSCOPY (VATS), MINI THORACOTOMY, LEFT UPPER LOBE WEDGE, TAKE DOWN OF INTERNAL MAMMARY LESIONS, PLACEMENT OF ON-Q PUMP;  Surgeon: Grace Isaac, MD;  Location: [place];  Service: Thoracic;  Laterality: Left;   VIDEO BRONCHOSCOPY N/A 03/02/2015   Procedure: BRONCHOSCOPY;  Surgeon: Grace Isaac, MD;  Location: [facility];  Service: Thoracic;  Laterality: N/A;   VIDEO BRONCHOSCOPY WITH ENDOBRONCHIAL  NAVIGATION N/A 10/08/2017   Procedure: VIDEO BRONCHOSCOPY WITH ENDOBRONCHIAL NAVIGATION WITH BIOPSIES OF LEFT UPPER LOBE AND LEFT LOWER LOBE;  Surgeon: Grace Isaac, MD;  Location:  [facility];  Service: Thoracic;  Laterality: N/A;    Allergies  Allergen Reactions   Nsaids Other (See Comments)    Pt states all NSAIDS does not sit well onstomach   Aspirin Other (See Comments)    Hurts stomach   Codeine Nausea And Vomiting   Nitroglycerin Other (See Comments)    Heart rate drops    Allergies as of 02/03/2022       Reactions   Nsaids Other (See Comments)   Pt states all NSAIDS does not sit well onstomach   Aspirin Other (See Comments)   Hurts stomach   Codeine Nausea And Vomiting   Nitroglycerin Other (See Comments)   Heart rate drops        Medication List        Accurate as of February 03, 2022 11:59 PM. If you have any questions, ask your nurse or doctor.          albuterol 108 (90 Base) MCG/ACT inhaler Commonly known as: VENTOLIN HFA Inhale 1-2 puffs into the lungs every 4 (four) hours as needed for wheezing or shortness of breath.   amLODipine 2.5 MG tablet Commonly known as: NORVASC Take 1 tablet (2.5 mg total) by mouth daily.   atorvastatin 80 MG tablet Commonly known as: LIPITOR Take 80 mg by mouth daily.   cefadroxil 500 MG capsule Commonly known as: DURICEF Take 1 capsule (500 mg total) by mouth 2 (two) times daily.   CENTRUM ADULT PO Take by mouth. 1 Tablet Daily.   Cyanocobalamin 2500 MCG Tabs Take 1 tablet by mouth daily.   docusate sodium 100 MG capsule Commonly known as: COLACE Take 100 mg by mouth 2 (two) times daily.   enoxaparin 40 MG/0.4ML injection Commonly known as: LOVENOX Inject 0.4 mLs (40 mg total) into the skin daily for 28 days.   famotidine 20 MG tablet Commonly known as: PEPCID Take 20 mg by mouth 2 (two) times daily.   gabapentin 300 MG capsule Commonly known as: NEURONTIN Take 300 mg by mouth 3 (three) times daily.    hydrochlorothiazide 12.5 MG tablet Commonly known as: HYDRODIURIL TAKE 1 TABLET BY MOUTH EVERY DAY   leflunomide 20 MG tablet Commonly known as: ARAVA Take 20 mg by mouth daily.   levothyroxine 88 MCG tablet Commonly known as: SYNTHROID TAKE 1 TABLET (88 MCG TOTAL) BY MOUTH DAILY BEFORE BREAKFAST. What changed: how much to take   loperamide 2 MG tablet Commonly known as: IMODIUM A-D Take 2 mg by mouth 4 (four) times daily as needed for diarrhea or loose stools.   methocarbamol 500 MG tablet Commonly known as: ROBAXIN Take 1 tablet (500 mg total) by mouth 2 (two) times daily as needed.   metoprolol tartrate 50 MG tablet Commonly known as: LOPRESSOR Take 50 mg by mouth 2 (two) times daily.   ondansetron 4 MG tablet Commonly known as: ZOFRAN Take 4 mg by mouth every 8 (eight) hours as needed for nausea or vomiting.   oxyCODONE-acetaminophen 5-325 MG tablet Commonly known as: PERCOCET/ROXICET Take 1 tablet by mouth every 6 (six) hours as needed for severe pain.   pantoprazole 40 MG tablet Commonly known as: PROTONIX Take 40 mg by mouth daily.   SIMPONI [place] every 2 (two) months. Every other month per Dr Jamie Brookes Respimat 2.5-2.5 MCG/ACT Aers Generic drug: Tiotropium Bromide-Olodaterol Inhale 2 each into the lungs at bedtime. 2 puffs Daily into Lungs.   triamcinolone 55 MCG/ACT Aero nasal inhaler Commonly known as: NASACORT  Place 1 spray into the nose daily.   Vitamin D3 50 MCG (2000 UT) capsule Take 1 capsule (2,000 Units total) by mouth daily.        Review of Systems  Constitutional:  Positive for activity change, appetite change and fatigue. Negative for fever.  HENT:  Negative for congestion and trouble swallowing.   Eyes:  Negative for visual disturbance.  Respiratory:  Positive for shortness of breath. Negative for cough and wheezing.        DOE  Cardiovascular:  Positive for leg swelling.  Gastrointestinal:  Positive for anal bleeding, blood in  stool and constipation. Negative for abdominal distention, abdominal pain, nausea, rectal pain and vomiting.  Genitourinary:  Positive for difficulty urinating.       Foley  Musculoskeletal:  Positive for arthralgias, back pain, gait problem and joint swelling.  Skin:  Positive for wound. Negative for color change.  Neurological:  Negative for tremors and headaches.  Psychiatric/Behavioral:  Negative for behavioral problems and sleep disturbance. The patient is not nervous/anxious.     Immunization History  Administered Date(s) Administered   Fluad Quad(high Dose 65+) 02/03/2021, 01/16/2022   Influenza Whole 05/02/2001, 01/27/2008, 01/12/2010, 12/31/2010, 01/25/2012   Influenza, High Dose Seasonal PF 01/14/2014, 02/13/2015, 12/21/2016, 01/09/2018, 12/29/2018   Influenza,inj,Quad PF,6+ Mos 12/23/2015   Influenza-Unspecified 02/03/2013, 01/13/2014, 02/13/2015, 01/20/2020   Meningococcal Polysaccharide 03/21/2012   Moderna SARS-COV2 Booster Vaccination 03/15/2020   Moderna Sars-Covid-2 Vaccination 06/02/2019, 06/30/2019   PPD Test 11/18/2013   Pfizer Covid-19 Vaccine Bivalent Booster 52yr & up 01/19/2021, 10/12/2021   Pneumococcal Conjugate-13 03/20/2013   Pneumococcal Polysaccharide-23 05/02/2000, 06/23/2010, 02/26/2018   Td 05/01/2006   Tdap 06/01/2016   Zoster Recombinat (Shingrix) 05/16/2017, 07/20/2017   Zoster, Live 01/17/2006   Pertinent  Health Maintenance Due  Topic Date Due   COLONOSCOPY (Pts 45-474yrInsurance coverage will need to be confirmed)  03/30/2019   INFLUENZA VACCINE  Completed   DEXA SCAN  Completed      01/28/2022    8:00 AM 01/28/2022    9:30 PM 01/29/2022    9:00 AM 01/29/2022   11:00 PM 01/30/2022   10:00 AM  Fall Risk  Patient Fall Risk Level High fall risk High fall risk High fall risk High fall risk High fall risk   Functional Status Survey:    Vitals:   02/03/22 1645  BP: (!) 177/78  Pulse: (!) 108  Resp: 18  Temp: 98.3 F (36.8 C)  SpO2:  95%   There is no height or weight on file to calculate BMI. Physical Exam HENT:     Head: Normocephalic and atraumatic.     Nose: Nose normal.     Mouth/Throat:     Mouth: Mucous membranes are moist.  Eyes:     Extraocular Movements: Extraocular movements intact.     Conjunctiva/sclera: Conjunctivae normal.     Pupils: Pupils are equal, round, and reactive to light.  Cardiovascular:     Rate and Rhythm: Normal rate and regular rhythm.     Heart sounds: No murmur heard. Pulmonary:     Effort: Pulmonary effort is normal.     Breath sounds: No wheezing or rhonchi.  Abdominal:     General: Bowel sounds are normal. There is no distension.     Palpations: Abdomen is soft.     Tenderness: There is no abdominal tenderness. There is no right CVA tenderness or left CVA tenderness.     Hernia: No hernia is present.  Genitourinary:  Comments: External hemorrhoids noted.  Musculoskeletal:        General: Tenderness present.     Cervical back: Normal range of motion and neck supple.     Right lower leg: Edema present.     Left lower leg: Edema present.     Comments: Moderate edema LLE, trace edema RLE  Skin:    General: Skin is warm and dry.     Comments: L hip surgical incisions covered in dressing.   Neurological:     General: No focal deficit present.     Mental Status: She is alert and oriented to person, place, and time. Mental status is at baseline.     Motor: No weakness.     Gait: Gait abnormal.  Psychiatric:        Mood and Affect: Mood normal.        Behavior: Behavior normal.        Thought Content: Thought content normal.     Labs reviewed: Recent Labs    12/06/21 0948 01/03/22 0952 01/23/22 0644 01/30/22 0756  NA 139 141 134*  --   K 4.5 4.2 4.3  --   CL 101 102 97*  --   CO2 '26 29 26  '$ --   GLUCOSE 105* 95 90  --   BUN 53* 54* 65*  --   CREATININE 2.51* 2.22* 2.71* 1.83*  CALCIUM 10.4 10.6* 9.8  --    Recent Labs    11/16/21 2356 12/06/21 0948  01/03/22 0952  AST '17 17 19  '$ ALT '14 10 12  '$ ALKPHOS 68 70 72  BILITOT 0.7 0.6 0.6  PROT 6.7 6.7 6.9  ALBUMIN 3.6 3.9 3.8   Recent Labs    12/06/21 0948 01/03/22 0952 01/23/22 0644 01/24/22 0859  WBC 8.5 8.6 9.5 13.5*  NEUTROABS 3.6 4.0  --   --   HGB 11.0* 12.2 12.2 11.0*  HCT 32.6* 37.1 36.3 33.0*  MCV 97.3 99.0 97.3 97.3  PLT 314.0 349.0 323 293   Lab Results  Component Value Date   TSH 2.35 06/21/2021   No results found for: "HGBA1C" Lab Results  Component Value Date   CHOL 157 09/07/2020   HDL 59.30 09/07/2020   LDLCALC 74 09/07/2020   LDLDIRECT 162.1 10/28/2009   TRIG 121.0 09/07/2020   CHOLHDL 3 09/07/2020    Significant Diagnostic Results in last 30 days:  VAS [place] LOWER EXTREMITY VENOUS (DVT)  Result Date: 01/28/2022  Lower Venous DVT Study Patient Name:  [name]  Date of Exam:   01/28/2022 Medical Rec #: [id number]       Accession #:    [id number] Date of Birth: 1940-06-16        Patient Gender: F Patient Age:   67 years Exam Location:  [facility] Procedure:      VAS [place] LOWER EXTREMITY VENOUS (DVT) Referring Phys: Gloriann Loan --------------------------------------------------------------------------------  Indications: Edema, and Swelling.  Comparison Study: no prior Performing Technologist: Archie Patten RVS  Examination Guidelines: A complete evaluation includes B-mode imaging, spectral Doppler, color Doppler, and power Doppler as needed of all accessible portions of each vessel. Bilateral testing is considered an integral part of a complete examination. Limited examinations for reoccurring indications may be performed as noted. The reflux portion of the exam is performed with the patient in reverse Trendelenburg.  +--------+---------------+---------+-----------+----------+--------------------+ RIGHT   CompressibilityPhasicitySpontaneityPropertiesThrombus Aging        +--------+---------------+---------+-----------+----------+--------------------+ CFV     Full  Yes      Yes                                       +--------+---------------+---------+-----------+----------+--------------------+ SFJ     Full                                                              +--------+---------------+---------+-----------+----------+--------------------+ FV Prox Full                                                              +--------+---------------+---------+-----------+----------+--------------------+ FV Mid  Full                                                              +--------+---------------+---------+-----------+----------+--------------------+ FV      Full                                                              Distal                                                                    +--------+---------------+---------+-----------+----------+--------------------+ PFV     Full                                                              +--------+---------------+---------+-----------+----------+--------------------+ POP     Full           Yes      Yes                                       +--------+---------------+---------+-----------+----------+--------------------+ PTV     Full                                                              +--------+---------------+---------+-----------+----------+--------------------+ PERO    None  in a single paired                                                        vein                 +--------+---------------+---------+-----------+----------+--------------------+   +---------+---------------+---------+-----------+----------+--------------+ LEFT     CompressibilityPhasicitySpontaneityPropertiesThrombus Aging +---------+---------------+---------+-----------+----------+--------------+ CFV      Full            Yes      Yes                                 +---------+---------------+---------+-----------+----------+--------------+ SFJ      Full                                                        +---------+---------------+---------+-----------+----------+--------------+ FV Prox  Full                                                        +---------+---------------+---------+-----------+----------+--------------+ FV Mid   Full                                                        +---------+---------------+---------+-----------+----------+--------------+ FV DistalFull                                                        +---------+---------------+---------+-----------+----------+--------------+ PFV      Full                                                        +---------+---------------+---------+-----------+----------+--------------+ POP      Full           Yes      Yes                                 +---------+---------------+---------+-----------+----------+--------------+ PTV      Full                                                        +---------+---------------+---------+-----------+----------+--------------+ PERO     Full                                                        +---------+---------------+---------+-----------+----------+--------------+  Summary: RIGHT: - 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 - A cystic structure is found in the popliteal fossa.  LEFT: - There is no evidence of deep vein thrombosis in the lower extremity.  - No cystic structure found in the popliteal fossa.  *See table(s) above for measurements and observations. Electronically signed by Deitra Mayo MD on 01/28/2022 at 4:00:42 PM.    Final    DG Pelvis Portable  Result Date: 01/23/2022 CLINICAL DATA:  Postop left hip arthroplasty. EXAM: PORTABLE PELVIS 1-2 VIEWS COMPARISON:  Left hip  radiographs 10/06/2021 FINDINGS: Sequelae of interval left total hip arthroplasty are identified. Gas is noted in the adjacent soft tissues. No acute fracture or dislocation is evident on this single image. There is mild superior hip joint space narrowing on the right. IMPRESSION: Left total hip arthroplasty without evidence of acute osseous abnormality. Electronically Signed   By: Logan Bores M.D.   On: 01/23/2022 10:43   DG HIP UNILAT WITH PELVIS 1V LEFT  Result Date: 01/23/2022 CLINICAL DATA:  Fluoroscopy provided during hip replacement. Fluoroscopy time: 24 seconds. Cumulative air kerma: 2.49 mGy EXAM: DG HIP (WITH OR WITHOUT PELVIS) 1V*L* COMPARISON:  None Available. FINDINGS: Four images were obtained during left hip replacement. By the end of the study, the left hip is been replaced in hardware is in good position on frontal imaging. IMPRESSION: Fluoroscopy provided during left hip replacement as above. Electronically Signed   By: Dorise Bullion III M.D.   On: 01/23/2022 09:07   DG C-Arm 1-60 Min-No Report  Result Date: 01/23/2022 Fluoroscopy was utilized by the requesting physician.  No radiographic interpretation.   DG C-Arm 1-60 Min-No Report  Result Date: 01/23/2022 Fluoroscopy was utilized by the requesting physician.  No radiographic interpretation.    Assessment/Plan: BRBPR (bright red blood per rectum) Hemorrhoids noted, will apply 2.5% Hydrocortisone cream bid, avoid constipation, obtain CBC/diff, Iron, Ferritin, Vit B12, Folate, FOBT x3  Slow transit constipation Continue Colace, MiraLax, increase Senokot S to II qhs  Primary osteoarthritis of left hip 01/23/22 s/p total replacement of L hip, f/u Dr Erlinda Hong 02/07/22, on perioperative antibiotics from admission on ward-Cefadroxil. Lovenox for 28 days. Prn Percocet, Methocarbamol  Urinary retention Foley, f/u Urology  DVT (deep venous thrombosis) ([place]) DVT 01/28/22 [place] age indeterminate deep vein thrombosis  involving the right  peroneal veins, on Lovenox.   Essential hypertension Blood pressure is controlled,  takes Amlodipine, HCTZ, Metoprolol  Dyslipidemia, goal LDL below 70  takes Atorvastatin, LDL 74 09/07/20  Vitamin D deficiency takes Vit D  Acute blood loss anemia (ABLA) take Vit B12, Hgb 11.0 01/24/22  GERD (gastroesophageal reflux disease)  takes Famotidine, Pantoprazole  Rheumatoid arthritis ([place]) takes Golimumab, Leflunomide, fx of knees, hands, wrists, cervical disc disease/fracture, takes Gabapentin,   Hypothyroidism takes Levothyroxine, TSH 2.35 06/21/21  Aortic atherosclerosis (HCC) s/p CABG x1, LIMA LAD for ostial LAD, EF wnl. S/p aneurysm repair  COPD GOLD I DOE/COPD, prn Albuterol ACT, Stiolto, Triamcinolone nasal spray  CKD (chronic kidney disease) stage 4, GFR 15-29 ml/min (HCC)  creat 1.83 01/30/22, followed by Nephrology  TIA (transient ischemic attack) on Atorvastatin, Lovenox    Family/ staff Communication: plan of care reviewed with the patient and charge nurse.   Labs/tests ordered:  CBC/diff, Iron, Ferritin, Vit B12, Folate, FOBT x3  Time spend 35 minutes.

## 2022-02-03 NOTE — Assessment & Plan Note (Signed)
Continue Colace, MiraLax, increase Senokot S to II qhs

## 2022-02-03 NOTE — Assessment & Plan Note (Signed)
Hemorrhoids noted, will apply 2.5% Hydrocortisone cream bid, avoid constipation, obtain CBC/diff, Iron, Ferritin, Vit B12, Folate, FOBT x3

## 2022-02-06 ENCOUNTER — Non-Acute Institutional Stay (SKILLED_NURSING_FACILITY): Payer: Medicare PPO | Admitting: Nurse Practitioner

## 2022-02-06 ENCOUNTER — Emergency Department (HOSPITAL_COMMUNITY): Payer: Medicare PPO

## 2022-02-06 ENCOUNTER — Encounter: Payer: Self-pay | Admitting: Nurse Practitioner

## 2022-02-06 ENCOUNTER — Inpatient Hospital Stay (HOSPITAL_COMMUNITY)
Admission: EM | Admit: 2022-02-06 | Discharge: 2022-02-11 | DRG: 175 | Disposition: A | Discharge status: Skilled Nursing Facility | Payer: Medicare PPO | Source: Skilled Nursing Facility | Attending: Family Medicine | Admitting: Family Medicine

## 2022-02-06 ENCOUNTER — Encounter (HOSPITAL_COMMUNITY): Payer: Self-pay | Admitting: Emergency Medicine

## 2022-02-06 DIAGNOSIS — R609 Edema, unspecified: Secondary | ICD-10-CM | POA: Diagnosis not present

## 2022-02-06 DIAGNOSIS — M069 Rheumatoid arthritis, unspecified: Secondary | ICD-10-CM

## 2022-02-06 DIAGNOSIS — E785 Hyperlipidemia, unspecified: Secondary | ICD-10-CM

## 2022-02-06 DIAGNOSIS — Z951 Presence of aortocoronary bypass graft: Secondary | ICD-10-CM | POA: Diagnosis not present

## 2022-02-06 DIAGNOSIS — H3530 Unspecified macular degeneration: Secondary | ICD-10-CM | POA: Diagnosis present

## 2022-02-06 DIAGNOSIS — Z79899 Other long term (current) drug therapy: Secondary | ICD-10-CM

## 2022-02-06 DIAGNOSIS — Z96642 Presence of left artificial hip joint: Secondary | ICD-10-CM

## 2022-02-06 DIAGNOSIS — I2489 Other forms of acute ischemic heart disease: Secondary | ICD-10-CM | POA: Diagnosis present

## 2022-02-06 DIAGNOSIS — I2693 Single subsegmental pulmonary embolism without acute cor pulmonale: Principal | ICD-10-CM | POA: Diagnosis present

## 2022-02-06 DIAGNOSIS — Z7901 Long term (current) use of anticoagulants: Secondary | ICD-10-CM | POA: Diagnosis not present

## 2022-02-06 DIAGNOSIS — I252 Old myocardial infarction: Secondary | ICD-10-CM | POA: Diagnosis not present

## 2022-02-06 DIAGNOSIS — E034 Atrophy of thyroid (acquired): Secondary | ICD-10-CM

## 2022-02-06 DIAGNOSIS — K649 Unspecified hemorrhoids: Secondary | ICD-10-CM | POA: Diagnosis present

## 2022-02-06 DIAGNOSIS — D72829 Elevated white blood cell count, unspecified: Secondary | ICD-10-CM | POA: Diagnosis present

## 2022-02-06 DIAGNOSIS — K5901 Slow transit constipation: Secondary | ICD-10-CM

## 2022-02-06 DIAGNOSIS — R339 Retention of urine, unspecified: Secondary | ICD-10-CM | POA: Diagnosis present

## 2022-02-06 DIAGNOSIS — Q8909 Congenital malformations of spleen: Secondary | ICD-10-CM

## 2022-02-06 DIAGNOSIS — N184 Chronic kidney disease, stage 4 (severe): Secondary | ICD-10-CM | POA: Diagnosis present

## 2022-02-06 DIAGNOSIS — Z809 Family history of malignant neoplasm, unspecified: Secondary | ICD-10-CM

## 2022-02-06 DIAGNOSIS — D62 Acute posthemorrhagic anemia: Secondary | ICD-10-CM | POA: Diagnosis not present

## 2022-02-06 DIAGNOSIS — Z83438 Family history of other disorder of lipoprotein metabolism and other lipidemia: Secondary | ICD-10-CM

## 2022-02-06 DIAGNOSIS — I639 Cerebral infarction, unspecified: Secondary | ICD-10-CM | POA: Diagnosis not present

## 2022-02-06 DIAGNOSIS — I959 Hypotension, unspecified: Secondary | ICD-10-CM | POA: Diagnosis not present

## 2022-02-06 DIAGNOSIS — Z87891 Personal history of nicotine dependence: Secondary | ICD-10-CM | POA: Diagnosis not present

## 2022-02-06 DIAGNOSIS — I2699 Other pulmonary embolism without acute cor pulmonale: Secondary | ICD-10-CM | POA: Diagnosis present

## 2022-02-06 DIAGNOSIS — Z79890 Hormone replacement therapy (postmenopausal): Secondary | ICD-10-CM | POA: Diagnosis not present

## 2022-02-06 DIAGNOSIS — K625 Hemorrhage of anus and rectum: Secondary | ICD-10-CM | POA: Diagnosis not present

## 2022-02-06 DIAGNOSIS — K219 Gastro-esophageal reflux disease without esophagitis: Secondary | ICD-10-CM | POA: Diagnosis present

## 2022-02-06 DIAGNOSIS — G459 Transient cerebral ischemic attack, unspecified: Secondary | ICD-10-CM

## 2022-02-06 DIAGNOSIS — Z7951 Long term (current) use of inhaled steroids: Secondary | ICD-10-CM

## 2022-02-06 DIAGNOSIS — Z20822 Contact with and (suspected) exposure to covid-19: Secondary | ICD-10-CM | POA: Diagnosis present

## 2022-02-06 DIAGNOSIS — Z7902 Long term (current) use of antithrombotics/antiplatelets: Secondary | ICD-10-CM

## 2022-02-06 DIAGNOSIS — R000 Tachycardia, unspecified: Secondary | ICD-10-CM

## 2022-02-06 DIAGNOSIS — J449 Chronic obstructive pulmonary disease, unspecified: Secondary | ICD-10-CM | POA: Diagnosis present

## 2022-02-06 DIAGNOSIS — M6281 Muscle weakness (generalized): Secondary | ICD-10-CM | POA: Diagnosis not present

## 2022-02-06 DIAGNOSIS — I700 Atherosclerosis of aorta: Secondary | ICD-10-CM

## 2022-02-06 DIAGNOSIS — J90 Pleural effusion, not elsewhere classified: Secondary | ICD-10-CM | POA: Diagnosis not present

## 2022-02-06 DIAGNOSIS — Z86718 Personal history of other venous thrombosis and embolism: Secondary | ICD-10-CM | POA: Diagnosis not present

## 2022-02-06 DIAGNOSIS — D649 Anemia, unspecified: Secondary | ICD-10-CM | POA: Diagnosis present

## 2022-02-06 DIAGNOSIS — K551 Chronic vascular disorders of intestine: Secondary | ICD-10-CM | POA: Diagnosis present

## 2022-02-06 DIAGNOSIS — M353 Polymyalgia rheumatica: Secondary | ICD-10-CM | POA: Diagnosis present

## 2022-02-06 DIAGNOSIS — J9811 Atelectasis: Secondary | ICD-10-CM | POA: Diagnosis not present

## 2022-02-06 DIAGNOSIS — M1612 Unilateral primary osteoarthritis, left hip: Secondary | ICD-10-CM | POA: Diagnosis not present

## 2022-02-06 DIAGNOSIS — R0902 Hypoxemia: Secondary | ICD-10-CM | POA: Diagnosis not present

## 2022-02-06 DIAGNOSIS — Z9081 Acquired absence of spleen: Secondary | ICD-10-CM

## 2022-02-06 DIAGNOSIS — I129 Hypertensive chronic kidney disease with stage 1 through stage 4 chronic kidney disease, or unspecified chronic kidney disease: Secondary | ICD-10-CM | POA: Diagnosis present

## 2022-02-06 DIAGNOSIS — R059 Cough, unspecified: Secondary | ICD-10-CM | POA: Diagnosis not present

## 2022-02-06 DIAGNOSIS — I2609 Other pulmonary embolism with acute cor pulmonale: Secondary | ICD-10-CM | POA: Diagnosis not present

## 2022-02-06 DIAGNOSIS — Z8249 Family history of ischemic heart disease and other diseases of the circulatory system: Secondary | ICD-10-CM

## 2022-02-06 DIAGNOSIS — R58 Hemorrhage, not elsewhere classified: Secondary | ICD-10-CM | POA: Diagnosis not present

## 2022-02-06 DIAGNOSIS — R350 Frequency of micturition: Secondary | ICD-10-CM | POA: Diagnosis not present

## 2022-02-06 DIAGNOSIS — I491 Atrial premature depolarization: Secondary | ICD-10-CM | POA: Diagnosis not present

## 2022-02-06 DIAGNOSIS — I2510 Atherosclerotic heart disease of native coronary artery without angina pectoris: Secondary | ICD-10-CM | POA: Diagnosis present

## 2022-02-06 DIAGNOSIS — R0602 Shortness of breath: Secondary | ICD-10-CM | POA: Diagnosis not present

## 2022-02-06 DIAGNOSIS — Z823 Family history of stroke: Secondary | ICD-10-CM

## 2022-02-06 DIAGNOSIS — E559 Vitamin D deficiency, unspecified: Secondary | ICD-10-CM | POA: Diagnosis not present

## 2022-02-06 DIAGNOSIS — I824Z1 Acute embolism and thrombosis of unspecified deep veins of right distal lower extremity: Secondary | ICD-10-CM | POA: Diagnosis not present

## 2022-02-06 DIAGNOSIS — I714 Abdominal aortic aneurysm, without rupture, unspecified: Secondary | ICD-10-CM | POA: Diagnosis not present

## 2022-02-06 DIAGNOSIS — E039 Hypothyroidism, unspecified: Secondary | ICD-10-CM | POA: Diagnosis present

## 2022-02-06 DIAGNOSIS — N760 Acute vaginitis: Secondary | ICD-10-CM | POA: Diagnosis present

## 2022-02-06 DIAGNOSIS — R4182 Altered mental status, unspecified: Secondary | ICD-10-CM | POA: Diagnosis not present

## 2022-02-06 DIAGNOSIS — Z8673 Personal history of transient ischemic attack (TIA), and cerebral infarction without residual deficits: Secondary | ICD-10-CM

## 2022-02-06 DIAGNOSIS — J9601 Acute respiratory failure with hypoxia: Secondary | ICD-10-CM | POA: Diagnosis present

## 2022-02-06 DIAGNOSIS — I10 Essential (primary) hypertension: Secondary | ICD-10-CM | POA: Diagnosis not present

## 2022-02-06 DIAGNOSIS — Z86711 Personal history of pulmonary embolism: Secondary | ICD-10-CM

## 2022-02-06 DIAGNOSIS — Z833 Family history of diabetes mellitus: Secondary | ICD-10-CM

## 2022-02-06 HISTORY — DX: Other pulmonary embolism without acute cor pulmonale: I26.99

## 2022-02-06 LAB — SARS CORONAVIRUS 2 BY RT PCR: SARS Coronavirus 2 by RT PCR: NEGATIVE

## 2022-02-06 LAB — URINALYSIS, ROUTINE W REFLEX MICROSCOPIC
Bilirubin Urine: NEGATIVE
Glucose, UA: NEGATIVE mg/dL
Hgb urine dipstick: NEGATIVE
Ketones, ur: NEGATIVE mg/dL
Nitrite: NEGATIVE
Protein, ur: 100 mg/dL — AB
Specific Gravity, Urine: 1.014 (ref 1.005–1.030)
pH: 5 (ref 5.0–8.0)

## 2022-02-06 LAB — COMPREHENSIVE METABOLIC PANEL WITH GFR
ALT: 36 U/L (ref 0–44)
AST: 63 U/L — ABNORMAL HIGH (ref 15–41)
Albumin: 2.4 g/dL — ABNORMAL LOW (ref 3.5–5.0)
Alkaline Phosphatase: 123 U/L (ref 38–126)
Anion gap: 15 (ref 5–15)
BUN: 33 mg/dL — ABNORMAL HIGH (ref 8–23)
CO2: 20 mmol/L — ABNORMAL LOW (ref 22–32)
Calcium: 8.2 mg/dL — ABNORMAL LOW (ref 8.9–10.3)
Chloride: 98 mmol/L (ref 98–111)
Creatinine, Ser: 1.89 mg/dL — ABNORMAL HIGH (ref 0.44–1.00)
GFR, Estimated: 26 mL/min — ABNORMAL LOW
Glucose, Bld: 122 mg/dL — ABNORMAL HIGH (ref 70–99)
Potassium: 4.3 mmol/L (ref 3.5–5.1)
Sodium: 133 mmol/L — ABNORMAL LOW (ref 135–145)
Total Bilirubin: 0.5 mg/dL (ref 0.3–1.2)
Total Protein: 6.5 g/dL (ref 6.5–8.1)

## 2022-02-06 LAB — BRAIN NATRIURETIC PEPTIDE: B Natriuretic Peptide: 653.8 pg/mL — ABNORMAL HIGH (ref 0.0–100.0)

## 2022-02-06 LAB — TROPONIN I (HIGH SENSITIVITY)
Troponin I (High Sensitivity): 47 ng/L — ABNORMAL HIGH
Troponin I (High Sensitivity): 42 ng/L — ABNORMAL HIGH (ref <18)

## 2022-02-06 LAB — CBC
HCT: 29.8 % — ABNORMAL LOW (ref 36.0–46.0)
Hemoglobin: 9.6 g/dL — ABNORMAL LOW (ref 12.0–15.0)
MCH: 32.7 pg (ref 26.0–34.0)
MCHC: 32.2 g/dL (ref 30.0–36.0)
MCV: 101.4 fL — ABNORMAL HIGH (ref 80.0–100.0)
Platelets: 422 10*3/uL — ABNORMAL HIGH (ref 150–400)
RBC: 2.94 MIL/uL — ABNORMAL LOW (ref 3.87–5.11)
RDW: 16.6 % — ABNORMAL HIGH (ref 11.5–15.5)
WBC: 14.5 10*3/uL — ABNORMAL HIGH (ref 4.0–10.5)
nRBC: 1.2 % — ABNORMAL HIGH (ref 0.0–0.2)

## 2022-02-06 LAB — MAGNESIUM: Magnesium: 1.3 mg/dL — ABNORMAL LOW (ref 1.7–2.4)

## 2022-02-06 MED ORDER — VANCOMYCIN HCL IN DEXTROSE 1-5 GM/200ML-% IV SOLN
1000.0000 mg | Freq: Once | INTRAVENOUS | Status: AC
  Administered 2022-02-06: 1000 mg via INTRAVENOUS
  Filled 2022-02-06: qty 200

## 2022-02-06 MED ORDER — UMECLIDINIUM BROMIDE 62.5 MCG/ACT IN AEPB
1.0000 | INHALATION_SPRAY | Freq: Every day | RESPIRATORY_TRACT | Status: DC
  Administered 2022-02-07 – 2022-02-11 (×4): 1 via RESPIRATORY_TRACT
  Filled 2022-02-06 (×2): qty 7

## 2022-02-06 MED ORDER — ARFORMOTEROL TARTRATE 15 MCG/2ML IN NEBU
15.0000 ug | INHALATION_SOLUTION | Freq: Two times a day (BID) | RESPIRATORY_TRACT | Status: DC
  Administered 2022-02-07 – 2022-02-10 (×9): 15 ug via RESPIRATORY_TRACT
  Filled 2022-02-06 (×9): qty 2

## 2022-02-06 MED ORDER — GOLIMUMAB 50 MG/0.5ML ~~LOC~~ SOAJ: Freq: Every day | SUBCUTANEOUS | Status: DC

## 2022-02-06 MED ORDER — SODIUM CHLORIDE 0.9 % IV BOLUS
1000.0000 mL | Freq: Once | INTRAVENOUS | Status: AC
  Administered 2022-02-06: 1000 mL via INTRAVENOUS

## 2022-02-06 MED ORDER — SODIUM CHLORIDE 0.9 % IV SOLN
2.0000 g | Freq: Once | INTRAVENOUS | Status: AC
  Administered 2022-02-06: 2 g via INTRAVENOUS
  Filled 2022-02-06: qty 12.5

## 2022-02-06 MED ORDER — ACETAMINOPHEN 650 MG RE SUPP: 650.0000 mg | Freq: Four times a day (QID) | RECTAL | Status: DC | PRN

## 2022-02-06 MED ORDER — PANTOPRAZOLE SODIUM 40 MG PO TBEC
40.0000 mg | DELAYED_RELEASE_TABLET | Freq: Every day | ORAL | Status: DC
  Administered 2022-02-07 – 2022-02-11 (×5): 40 mg via ORAL
  Filled 2022-02-06 (×5): qty 1

## 2022-02-06 MED ORDER — ATORVASTATIN CALCIUM 80 MG PO TABS
80.0000 mg | ORAL_TABLET | Freq: Every day | ORAL | Status: DC
  Administered 2022-02-07 – 2022-02-11 (×5): 80 mg via ORAL
  Filled 2022-02-06 (×2): qty 1
  Filled 2022-02-06: qty 2
  Filled 2022-02-06 (×2): qty 1

## 2022-02-06 MED ORDER — METOPROLOL TARTRATE 50 MG PO TABS
50.0000 mg | ORAL_TABLET | Freq: Two times a day (BID) | ORAL | Status: DC
  Administered 2022-02-07 – 2022-02-11 (×9): 50 mg via ORAL
  Filled 2022-02-06 (×2): qty 1
  Filled 2022-02-06: qty 2
  Filled 2022-02-06 (×4): qty 1
  Filled 2022-02-06: qty 2
  Filled 2022-02-06 (×2): qty 1

## 2022-02-06 MED ORDER — SODIUM CHLORIDE 0.9% FLUSH
3.0000 mL | Freq: Two times a day (BID) | INTRAVENOUS | Status: DC
  Administered 2022-02-07 – 2022-02-10 (×8): 3 mL via INTRAVENOUS

## 2022-02-06 MED ORDER — ACETAMINOPHEN 325 MG PO TABS
650.0000 mg | ORAL_TABLET | Freq: Four times a day (QID) | ORAL | Status: DC | PRN
  Administered 2022-02-10: 650 mg via ORAL
  Filled 2022-02-06: qty 2

## 2022-02-06 MED ORDER — HEPARIN BOLUS VIA INFUSION
4000.0000 [IU] | Freq: Once | INTRAVENOUS | Status: AC
  Administered 2022-02-06: 4000 [IU] via INTRAVENOUS
  Filled 2022-02-06: qty 4000

## 2022-02-06 MED ORDER — POLYETHYLENE GLYCOL 3350 17 G PO PACK: 17.0000 g | PACK | Freq: Every day | ORAL | Status: DC | PRN

## 2022-02-06 MED ORDER — SODIUM CHLORIDE 0.9 % IV SOLN: 2.0000 g | INTRAVENOUS | Status: DC

## 2022-02-06 MED ORDER — GABAPENTIN 300 MG PO CAPS
300.0000 mg | ORAL_CAPSULE | Freq: Three times a day (TID) | ORAL | Status: DC
  Administered 2022-02-07 – 2022-02-11 (×14): 300 mg via ORAL
  Filled 2022-02-06 (×14): qty 1

## 2022-02-06 MED ORDER — HEPARIN (PORCINE) 25000 UT/250ML-% IV SOLN
1150.0000 [IU]/h | INTRAVENOUS | Status: DC
  Administered 2022-02-06 – 2022-02-07 (×2): 1150 [IU]/h via INTRAVENOUS
  Filled 2022-02-06 (×2): qty 250

## 2022-02-06 MED ORDER — LEFLUNOMIDE 10 MG PO TABS
20.0000 mg | ORAL_TABLET | Freq: Every day | ORAL | Status: DC
  Administered 2022-02-07 – 2022-02-10 (×4): 20 mg via ORAL
  Filled 2022-02-06 (×2): qty 1
  Filled 2022-02-06: qty 2
  Filled 2022-02-06 (×4): qty 1

## 2022-02-06 MED ORDER — HYDROCHLOROTHIAZIDE 12.5 MG PO TABS
12.5000 mg | ORAL_TABLET | Freq: Every day | ORAL | Status: DC
  Administered 2022-02-07 – 2022-02-11 (×5): 12.5 mg via ORAL
  Filled 2022-02-06 (×5): qty 1

## 2022-02-06 MED ORDER — LEVOTHYROXINE SODIUM 88 MCG PO TABS
88.0000 ug | ORAL_TABLET | Freq: Every day | ORAL | Status: DC
  Administered 2022-02-07 – 2022-02-11 (×5): 88 ug via ORAL
  Filled 2022-02-06 (×5): qty 1

## 2022-02-06 MED ORDER — AMLODIPINE BESYLATE 2.5 MG PO TABS
2.5000 mg | ORAL_TABLET | Freq: Every day | ORAL | Status: DC
  Administered 2022-02-07 – 2022-02-10 (×4): 2.5 mg via ORAL
  Filled 2022-02-06 (×5): qty 1

## 2022-02-06 MED ORDER — MAGNESIUM SULFATE 2 GM/50ML IV SOLN
2.0000 g | Freq: Once | INTRAVENOUS | Status: AC
  Administered 2022-02-06: 2 g via INTRAVENOUS
  Filled 2022-02-06: qty 50

## 2022-02-06 MED ORDER — IOHEXOL 350 MG/ML SOLN
80.0000 mL | Freq: Once | INTRAVENOUS | Status: AC | PRN
  Administered 2022-02-06: 80 mL via INTRAVENOUS

## 2022-02-06 MED ORDER — FAMOTIDINE 20 MG PO TABS
20.0000 mg | ORAL_TABLET | Freq: Every day | ORAL | Status: DC
  Administered 2022-02-07 – 2022-02-11 (×5): 20 mg via ORAL
  Filled 2022-02-06 (×5): qty 1

## 2022-02-06 MED ORDER — ALBUTEROL SULFATE (2.5 MG/3ML) 0.083% IN NEBU
3.0000 mL | INHALATION_SOLUTION | RESPIRATORY_TRACT | Status: DC | PRN
  Administered 2022-02-08: 3 mL via RESPIRATORY_TRACT
  Filled 2022-02-06: qty 3

## 2022-02-06 NOTE — ED Notes (Signed)
Pt transported to CT ?

## 2022-02-06 NOTE — Assessment & Plan Note (Signed)
takes Levothyroxine, TSH 2.35 06/21/21 

## 2022-02-06 NOTE — Assessment & Plan Note (Signed)
HR 120bpm while on commode, then 80bpm later when the patient resting in recliner. Obtain EKG

## 2022-02-06 NOTE — Progress Notes (Signed)
Location:   S[facility] [place] Room Number: 66 Place of Service:  SNF (31) Provider: Lennie Odor [name]  Plotnikov, [name], MD  Patient Care Team: Cassandria Anger, MD as PCP - General (Internal Medicine) Leonie Madonna Flegal, MD as PCP - Cardiology (Cardiology) Leonie Dequita Schleicher, MD as Consulting Physician (Cardiology) Gavin Pound, MD as Consulting Physician (Rheumatology) Armbruster, Carlota Raspberry, MD as Consulting Physician (Gastroenterology) Tanda Rockers, MD as Consulting Physician (Pulmonary Disease) Grace Isaac, MD (Inactive) as Consulting Physician (Cardiothoracic Surgery) Aloha Gell, MD as Consulting Physician (Obstetrics and Gynecology) Alain Marion, [name], MD  Extended Emergency Contact Information Primary Emergency Contact: [name] Address: [address]           [place], [place] [address] [place] Phone: [phone] Mobile Phone: [phone] Relation: Spouse Secondary Emergency Contact: [name],[name] Address: [address], [place] [address] [place] Phone: [phone] Relation: Friend  Code Status: DNR Goals of care: Advanced Directive information    01/31/2022    9:50 AM  Advanced Directives  Does Patient Have a Medical Advance Directive? No     Chief Complaint  Patient presents with   Acute Visit    Cough, O2 desaturation, SOB    HPI:  Pt is a 81 y.o. female seen today for an acute visit for O2 desaturation, cough, SOB, afebrile, denied chest pain or phlegm production.    DOE/COPD, prn Albuterol ACT, Stiolto, Triamcinolone nasal spray, O2 2lpm  BRBPR hemorrhoids, Hydrocortisone cream, avoid constipation, pending CBC, FOBT, Fe, Vit B12, Folate 01/23/22 s/p total replacement of L hip, f/u Dr Erlinda Hong 02/07/22, on perioperative antibiotics from admission on ward-Cefadroxil. Lovenox for 28 days. Prn Percocet, Methocarbamol             Urinary retention, Foley, f/u Urology              DVT 01/28/22 [place] age indeterminate deep vein thrombosis  involving the right peroneal veins, on Lovenox.              HTN, takes Amlodipine, Metoprolol, HCTZ             HLD takes Atorvastatin, LDL 74 09/07/20             Vit D deficiency, takes Vit D             Anemia, take Vit B12, Hgb 11.0 01/24/22             Constipation, takes Colace, MiraLax, Senokot S             GERD, takes Famotidine, Pantoprazole             RA, takes Golimumab, Leflunomide, fx of knees, hands, wrists, cervical disc disease/fracture, takes Gabapentin,              Hypothyroidism, takes Levothyroxine, TSH 2.35 06/21/21             CAD s/p CABG x1, LIMA LAD for ostial LAD, EF wnl. 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 Most likely scar. Mild subendocardial ischemia. EF 71% LOW RISK.    Cervical disc disease    fracture   Chronic kidney disease    COPD (chronic obstructive pulmonary disease) (HCC)    Diverticulosis    Hyperlipidemia    Hypertension    Hypothyroidism (acquired)    hypo   Myocardial infarction ([place]) 11/2014   TIA   Polymyalgia rheumatica ([place])    2011 Dr. Charlestine Night   Rheumatoid arthritis [facility]) 2011   Dr.Truslow; fracture knees, hands and wrists -    S/P CABG x 1 1996   CABG--LIMA-LAD for ostial LAD (not felt to be PCI  amenable). 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 Atretic LIMA. Minimal disease the RCA and Circumflex system.   CARPAL TUNNEL RELEASE Left    CATARACT EXTRACTION Bilateral    CERVICAL SPINE SURGERY     plate 2008 Dr. Saintclair Halsted   COLONOSCOPY     CORONARY ARTERY BYPASS GRAFT  1996   INCLUDED AN INTERNAL MAMMARY ARTERY TO THE LAD. EF WAS NORMAL   INGUINAL HERNIA REPAIR Right    LAPAROSCOPIC APPENDECTOMY N/A 06/28/2016   Procedure: APPENDECTOMY LAPAROSCOPIC;  Surgeon: Leighton Ruff, MD;  Location: [facility];  Service: General;  Laterality: N/A;   NM MYOVIEW LTD  06/2010   Fixed anteroseptal, apical and inferoapical defect with moderate size. Most likely scar. Mild subendocardial ischemia. EF 71% LOW RISK.    SPLENECTOMY     TOTAL HIP ARTHROPLASTY Left 01/23/2022   Procedure: LEFT TOTAL HIP ARTHROPLASTY ANTERIOR APPROACH;  Surgeon: Leandrew Koyanagi, MD;  Location: [place];  Service: Orthopedics;  Laterality: Left;  3-C   TRANSTHORACIC ECHOCARDIOGRAM  12/2014   [facility]: Normal LV size & function. EF 55-60%,    vagina polyp     VIDEO ASSISTED THORACOSCOPY (VATS)/WEDGE RESECTION Left 03/02/2015   Procedure: VIDEO ASSISTED THORACOSCOPY (VATS), MINI THORACOTOMY, LEFT UPPER LOBE WEDGE, TAKE DOWN OF INTERNAL MAMMARY LESIONS, PLACEMENT OF ON-Q PUMP;  Surgeon: Grace Isaac, MD;  Location: [facility];  Service: Thoracic;  Laterality: Left;   VIDEO BRONCHOSCOPY N/A 03/02/2015   Procedure: BRONCHOSCOPY;  Surgeon: Grace Isaac, MD;  Location: [facility];   Service: Thoracic;  Laterality: N/A;   VIDEO BRONCHOSCOPY WITH ENDOBRONCHIAL NAVIGATION N/A 10/08/2017   Procedure: VIDEO BRONCHOSCOPY WITH ENDOBRONCHIAL NAVIGATION WITH BIOPSIES OF LEFT UPPER LOBE AND LEFT LOWER LOBE;  Surgeon: Grace Isaac, MD;  Location: [place];  Service: Thoracic;  Laterality: N/A;    Allergies  Allergen Reactions   Nsaids Other (See Comments)    Pt states all NSAIDS does not sit well onstomach   Aspirin Other (See Comments)    Hurts stomach   Codeine Nausea And Vomiting   Nitroglycerin Other (See Comments)    Heart rate drops    Allergies as of 02/06/2022       Reactions   Nsaids Other (See Comments)   Pt states all NSAIDS does not sit well onstomach   Aspirin Other (See Comments)   Hurts stomach   Codeine Nausea And Vomiting   Nitroglycerin Other (See Comments)   Heart rate drops        Medication List        Accurate as of February 06, 2022 11:27 AM. If you have any questions, ask your nurse or doctor.          albuterol 108 (90 Base) MCG/ACT inhaler Commonly known as: VENTOLIN HFA Inhale 1-2 puffs into the lungs every 4 (four) hours as needed for wheezing or shortness of breath.   amLODipine 2.5 MG tablet Commonly known as: NORVASC Take 1 tablet (2.5 mg total) by mouth daily.   atorvastatin 80 MG tablet Commonly known as: LIPITOR Take 80 mg by mouth daily.   cefadroxil 500 MG capsule Commonly known as: DURICEF Take 1 capsule (500 mg total) by mouth 2 (two) times daily.   CENTRUM ADULT PO Take by mouth. 1 Tablet Daily.   Cyanocobalamin 2500 MCG Tabs Take 1 tablet by mouth daily.   docusate sodium 100 MG capsule Commonly known as: COLACE Take 100 mg by mouth 2 (two) times daily.   enoxaparin 40 MG/0.4ML injection Commonly known as: LOVENOX Inject 0.4 mLs (40 mg total) into the skin daily for 28 days.   famotidine 20 MG tablet Commonly known as: PEPCID Take 20 mg by mouth 2 (two) times daily.   gabapentin 300 MG  capsule Commonly known as: NEURONTIN Take 300 mg by mouth 3 (three) times daily.   hydrochlorothiazide 12.5 MG tablet Commonly known as: HYDRODIURIL TAKE 1 TABLET BY MOUTH EVERY DAY   leflunomide 20 MG tablet Commonly known as: ARAVA Take 20 mg by mouth daily.   levothyroxine 88 MCG tablet Commonly known as: SYNTHROID TAKE 1 TABLET (88 MCG TOTAL) BY MOUTH DAILY BEFORE BREAKFAST. What changed: how much to take   loperamide 2 MG tablet Commonly known as: IMODIUM A-D Take 2 mg by mouth 4 (four) times daily as needed for diarrhea or loose stools.   methocarbamol 500 MG tablet Commonly known as: ROBAXIN Take 1 tablet (500 mg total) by mouth 2 (two) times daily as needed.   metoprolol tartrate 50 MG tablet Commonly known as: LOPRESSOR Take 50 mg by mouth 2 (two) times daily.   ondansetron 4 MG tablet Commonly known as: ZOFRAN Take 4 mg by mouth every 8 (eight) hours as needed for nausea or vomiting.   oxyCODONE-acetaminophen 5-325 MG tablet Commonly known as: PERCOCET/ROXICET Take 1 tablet by mouth every 6 (six) hours as needed for severe pain.   pantoprazole 40 MG tablet Commonly known as: PROTONIX Take 40 mg by mouth daily.   SIMPONI Dixon every 2 (two) months. Every other month per Dr Jamie Brookes Respimat 2.5-2.5 MCG/ACT Aers Generic drug: Tiotropium Bromide-Olodaterol Inhale 2 each into the lungs at bedtime. 2 puffs Daily into Lungs.   triamcinolone 55 MCG/ACT Aero nasal inhaler Commonly known as: NASACORT Place 1 spray  into the nose daily.   Vitamin D3 50 MCG (2000 UT) capsule Take 1 capsule (2,000 Units total) by mouth daily.        Review of Systems  Constitutional:  Positive for activity change, appetite change and fatigue. Negative for fever.  HENT:  Negative for congestion and trouble swallowing.   Eyes:  Negative for visual disturbance.  Respiratory:  Positive for cough and shortness of breath. Negative for wheezing.        DOE  Cardiovascular:   Positive for leg swelling.  Gastrointestinal:  Negative for abdominal pain, anal bleeding, constipation and vomiting.  Genitourinary:  Positive for difficulty urinating.       Foley  Musculoskeletal:  Positive for arthralgias, back pain, gait problem and joint swelling.  Skin:  Positive for wound. Negative for color change.  Neurological:  Negative for tremors and headaches.  Psychiatric/Behavioral:  Negative for behavioral problems and sleep disturbance. The patient is not nervous/anxious.     Immunization History  Administered Date(s) Administered   Fluad Quad(high Dose 65+) 02/03/2021, 01/16/2022   Influenza Whole 05/02/2001, 01/27/2008, 01/12/2010, 12/31/2010, 01/25/2012   Influenza, High Dose Seasonal PF 01/14/2014, 02/13/2015, 12/21/2016, 01/09/2018, 12/29/2018   Influenza,inj,Quad PF,6+ Mos 12/23/2015   Influenza-Unspecified 02/03/2013, 01/13/2014, 02/13/2015, 01/20/2020   Meningococcal Polysaccharide 03/21/2012   Moderna SARS-COV2 Booster Vaccination 03/15/2020   Moderna Sars-Covid-2 Vaccination 06/02/2019, 06/30/2019   PPD Test 11/18/2013   Pfizer Covid-19 Vaccine Bivalent Booster 35yr & up 01/19/2021, 10/12/2021   Pneumococcal Conjugate-13 03/20/2013   Pneumococcal Polysaccharide-23 05/02/2000, 06/23/2010, 02/26/2018   Td 05/01/2006   Tdap 06/01/2016   Zoster Recombinat (Shingrix) 05/16/2017, 07/20/2017   Zoster, Live 01/17/2006   Pertinent  Health Maintenance Due  Topic Date Due   COLONOSCOPY (Pts 45-474yrInsurance coverage will need to be confirmed)  03/30/2019   INFLUENZA VACCINE  Completed   DEXA SCAN  Completed      01/28/2022    8:00 AM 01/28/2022    9:30 PM 01/29/2022    9:00 AM 01/29/2022   11:00 PM 01/30/2022   10:00 AM  Fall Risk  Patient Fall Risk Level High fall risk High fall risk High fall risk High fall risk High fall risk   Functional Status Survey:    Vitals:   02/06/22 1104  BP: 120/64  Pulse: (!) 120  Resp: 18  Temp: (!) 97.3 F (36.3 C)   SpO2: 93%   There is no height or weight on file to calculate BMI. Physical Exam Constitutional:      Comments: Exhausted easily.   HENT:     Head: Normocephalic and atraumatic.     Nose: Nose normal.     Mouth/Throat:     Mouth: Mucous membranes are moist.  Eyes:     Extraocular Movements: Extraocular movements intact.     Conjunctiva/sclera: Conjunctivae normal.     Pupils: Pupils are equal, round, and reactive to light.  Cardiovascular:     Rate and Rhythm: Normal rate and regular rhythm.     Heart sounds: No murmur heard. Pulmonary:     Effort: Pulmonary effort is normal.     Breath sounds: No wheezing or rhonchi.  Abdominal:     General: Bowel sounds are normal.     Palpations: Abdomen is soft.     Tenderness: There is no abdominal tenderness.     Hernia: No hernia is present.  Genitourinary:    Comments: External hemorrhoids from previous examination.  Musculoskeletal:        General:  Tenderness present.     Cervical back: Normal range of motion and neck supple.     Right lower leg: Edema present.     Left lower leg: Edema present.     Comments: Moderate edema LLE, trace edema RLE  Skin:    General: Skin is warm and dry.     Comments: L hip surgical incisions covered in dressing.   Neurological:     General: No focal deficit present.     Mental Status: She is alert and oriented to person, place, and time. Mental status is at baseline.     Motor: No weakness.     Gait: Gait abnormal.  Psychiatric:        Mood and Affect: Mood normal.        Behavior: Behavior normal.        Thought Content: Thought content normal.     Labs reviewed: Recent Labs    12/06/21 0948 01/03/22 0952 01/23/22 0644 01/30/22 0756  NA 139 141 134*  --   K 4.5 4.2 4.3  --   CL 101 102 97*  --   CO2 '26 29 26  '$ --   GLUCOSE 105* 95 90  --   BUN 53* 54* 65*  --   CREATININE 2.51* 2.22* 2.71* 1.83*  CALCIUM 10.4 10.6* 9.8  --    Recent Labs    11/16/21 2356 12/06/21 0948  01/03/22 0952  AST '17 17 19  '$ ALT '14 10 12  '$ ALKPHOS 68 70 72  BILITOT 0.7 0.6 0.6  PROT 6.7 6.7 6.9  ALBUMIN 3.6 3.9 3.8   Recent Labs    12/06/21 0948 01/03/22 0952 01/23/22 0644 01/24/22 0859  WBC 8.5 8.6 9.5 13.5*  NEUTROABS 3.6 4.0  --   --   HGB 11.0* 12.2 12.2 11.0*  HCT 32.6* 37.1 36.3 33.0*  MCV 97.3 99.0 97.3 97.3  PLT 314.0 349.0 323 293   Lab Results  Component Value Date   TSH 2.35 06/21/2021   No results found for: "HGBA1C" Lab Results  Component Value Date   CHOL 157 09/07/2020   HDL 59.30 09/07/2020   LDLCALC 74 09/07/2020   LDLDIRECT 162.1 10/28/2009   TRIG 121.0 09/07/2020   CHOLHDL 3 09/07/2020    Significant Diagnostic Results in last 30 days:  VAS [place] LOWER EXTREMITY VENOUS (DVT)  Result Date: 01/28/2022  Lower Venous DVT Study Patient Name:  [name]  Date of Exam:   01/28/2022 Medical Rec #: [id number]       Accession #:    [id number] Date of Birth: 11/24/1940        Patient Gender: F Patient Age:   58 years Exam Location:  [facility] Procedure:      VAS [place] LOWER EXTREMITY VENOUS (DVT) Referring Phys: Gloriann Loan --------------------------------------------------------------------------------  Indications: Edema, and Swelling.  Comparison Study: no prior Performing Technologist: Archie Patten RVS  Examination Guidelines: A complete evaluation includes B-mode imaging, spectral Doppler, color Doppler, and power Doppler as needed of all accessible portions of each vessel. Bilateral testing is considered an integral part of a complete examination. Limited examinations for reoccurring indications may be performed as noted. The reflux portion of the exam is performed with the patient in reverse Trendelenburg.  +--------+---------------+---------+-----------+----------+--------------------+ RIGHT   CompressibilityPhasicitySpontaneityPropertiesThrombus Aging        +--------+---------------+---------+-----------+----------+--------------------+ CFV     Full           Yes      Yes                                       +--------+---------------+---------+-----------+----------+--------------------+  SFJ     Full                                                              +--------+---------------+---------+-----------+----------+--------------------+ FV Prox Full                                                              +--------+---------------+---------+-----------+----------+--------------------+ FV Mid  Full                                                              +--------+---------------+---------+-----------+----------+--------------------+ FV      Full                                                              Distal                                                                    +--------+---------------+---------+-----------+----------+--------------------+ PFV     Full                                                              +--------+---------------+---------+-----------+----------+--------------------+ POP     Full           Yes      Yes                                       +--------+---------------+---------+-----------+----------+--------------------+ PTV     Full                                                              +--------+---------------+---------+-----------+----------+--------------------+ PERO    None                                         in a single paired  vein                 +--------+---------------+---------+-----------+----------+--------------------+   +---------+---------------+---------+-----------+----------+--------------+ LEFT     CompressibilityPhasicitySpontaneityPropertiesThrombus Aging +---------+---------------+---------+-----------+----------+--------------+ CFV      Full            Yes      Yes                                 +---------+---------------+---------+-----------+----------+--------------+ SFJ      Full                                                        +---------+---------------+---------+-----------+----------+--------------+ FV Prox  Full                                                        +---------+---------------+---------+-----------+----------+--------------+ FV Mid   Full                                                        +---------+---------------+---------+-----------+----------+--------------+ FV DistalFull                                                        +---------+---------------+---------+-----------+----------+--------------+ PFV      Full                                                        +---------+---------------+---------+-----------+----------+--------------+ POP      Full           Yes      Yes                                 +---------+---------------+---------+-----------+----------+--------------+ PTV      Full                                                        +---------+---------------+---------+-----------+----------+--------------+ PERO     Full                                                        +---------+---------------+---------+-----------+----------+--------------+     Summary: RIGHT: - 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 - A cystic structure is found  in the popliteal fossa.  LEFT: - There is no evidence of deep vein thrombosis in the lower extremity.  - No cystic structure found in the popliteal fossa.  *See table(s) above for measurements and observations. Electronically signed by Deitra Mayo MD on 01/28/2022 at 4:00:42 PM.    Final    DG Pelvis Portable  Result Date: 01/23/2022 CLINICAL DATA:  Postop left hip arthroplasty. EXAM: PORTABLE PELVIS 1-2 VIEWS COMPARISON:  Left hip  radiographs 10/06/2021 FINDINGS: Sequelae of interval left total hip arthroplasty are identified. Gas is noted in the adjacent soft tissues. No acute fracture or dislocation is evident on this single image. There is mild superior hip joint space narrowing on the right. IMPRESSION: Left total hip arthroplasty without evidence of acute osseous abnormality. Electronically Signed   By: Logan Bores M.D.   On: 01/23/2022 10:43   DG HIP UNILAT WITH PELVIS 1V LEFT  Result Date: 01/23/2022 CLINICAL DATA:  Fluoroscopy provided during hip replacement. Fluoroscopy time: 24 seconds. Cumulative air kerma: 2.49 mGy EXAM: DG HIP (WITH OR WITHOUT PELVIS) 1V*L* COMPARISON:  None Available. FINDINGS: Four images were obtained during left hip replacement. By the end of the study, the left hip is been replaced in hardware is in good position on frontal imaging. IMPRESSION: Fluoroscopy provided during left hip replacement as above. Electronically Signed   By: Dorise Bullion III M.D.   On: 01/23/2022 09:07   DG C-Arm 1-60 Min-No Report  Result Date: 01/23/2022 Fluoroscopy was utilized by the requesting physician.  No radiographic interpretation.   DG C-Arm 1-60 Min-No Report  Result Date: 01/23/2022 Fluoroscopy was utilized by the requesting physician.  No radiographic interpretation.    Assessment/Plan: COPD GOLD I O2 desaturation, cough, SOB, afebrile, denied chest pain or phlegm production. DOE/COPD, prn Albuterol ACT, Stiolto, Triamcinolone nasal spray, O2 2lpm. Obtain CXR ap/lateral to evaluate further.   Tachycardia HR 120bpm while on commode, then 80bpm later when the patient resting in recliner. Obtain EKG   BRBPR (bright red blood per rectum) Hydrocortisone cream, avoid constipation, pending CBC, FOBT, Fe, Vit B12, Folate  Status post total replacement of left hip 01/23/22 s/p total replacement of L hip, f/u Dr Erlinda Hong 02/07/22, on perioperative antibiotics from admission on ward-Cefadroxil. Lovenox for 28  days. Prn Percocet, Methocarbamol  Urinary retention Foley, f/u Urology  DVT (deep venous thrombosis) ([place]) DVT 01/28/22 [place] age indeterminate deep vein thrombosis  involving the right peroneal veins, on Lovenox.   Essential hypertension Blood pressure is controlled,  takes Amlodipine, Metoprolol, HCTZ  Dyslipidemia, goal LDL below 70 takes Atorvastatin, LDL 74 09/07/20  Vitamin D deficiency takes Vit D  Acute blood loss anemia (ABLA)  take Vit B12, Hgb 11.0 01/24/22  Slow transit constipation Stable,  takes Colace, MiraLax, Senokot S  GERD (gastroesophageal reflux disease)  takes Famotidine, Pantoprazole, stable  Rheumatoid arthritis (HCC) takes Golimumab, Leflunomide, fx of knees, hands, wrists, cervical disc disease/fracture, takes Gabapentin,   Hypothyroidism  takes Levothyroxine, TSH 2.35 06/21/21  Aortic atherosclerosis (HCC)  CAD s/p CABG x1, LIMA LAD for ostial LAD, EF wnl. S/p aneurysm repair  CKD (chronic kidney disease) stage 4, GFR 15-29 ml/min (HCC)  creat 1.83 01/30/22, followed by Nephrology  TIA (transient ischemic attack) on Atorvastatin    Family/ staff Communication: plan of care reviewed with the patient and charge nurse.   Labs/tests ordered:  CXR ap/lateral, EKG  Time spend 35 minutes

## 2022-02-06 NOTE — Assessment & Plan Note (Signed)
takes Vit D 

## 2022-02-06 NOTE — ED Provider Notes (Signed)
Patient requires a CT PE study due to high clinical concern for pulmonary embolism.  Patient's creatinine is at her baseline, patient is appropriate for CT scan at this time.   Nyrie Sigal, [place], MD 02/06/22 2059    Drenda Freeze, MD 02/06/22 2325

## 2022-02-06 NOTE — Assessment & Plan Note (Signed)
takes Atorvastatin, LDL 74 09/07/20 

## 2022-02-06 NOTE — ED Notes (Addendum)
Oxygen flow rate increased to 4L for SpO2 of 78% on 2 L.

## 2022-02-06 NOTE — Assessment & Plan Note (Signed)
takes Golimumab, Leflunomide, fx of knees, hands, wrists, cervical disc disease/fracture, takes Gabapentin,

## 2022-02-06 NOTE — Assessment & Plan Note (Signed)
take Vit B12, Hgb 11.0 01/24/22

## 2022-02-06 NOTE — Assessment & Plan Note (Signed)
O2 desaturation, cough, SOB, afebrile, denied chest pain or phlegm production. DOE/COPD, prn Albuterol ACT, Stiolto, Triamcinolone nasal spray, O2 2lpm. Obtain CXR ap/lateral to evaluate further.

## 2022-02-06 NOTE — Assessment & Plan Note (Signed)
Blood pressure is controlled,  takes Amlodipine, Metoprolol, HCTZ

## 2022-02-06 NOTE — Assessment & Plan Note (Signed)
CAD s/p CABG x1, LIMA LAD for ostial LAD, EF wnl. S/p aneurysm repair

## 2022-02-06 NOTE — Assessment & Plan Note (Signed)
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 Lovenox for 28 days. Prn Percocet, Methocarbamol

## 2022-02-06 NOTE — Assessment & Plan Note (Signed)
s/p CABG x1, LIMA LAD for ostial LAD, EF wnl. S/p aneurysm repair 

## 2022-02-06 NOTE — Assessment & Plan Note (Signed)
DOE/COPD, prn Albuterol ACT, Stiolto, Triamcinolone nasal spray

## 2022-02-06 NOTE — Assessment & Plan Note (Signed)
Stable,  takes Colace, MiraLax, Senokot S

## 2022-02-06 NOTE — ED Notes (Signed)
Pt is more lethargic again - arouses to voice but falls back asleep quickly. Pt desatted to 78% on room air - placed on 3L at this time

## 2022-02-06 NOTE — Progress Notes (Signed)
ANTICOAGULATION CONSULT NOTE - Initial Consult  Pharmacy Consult for heparin Indication: pulmonary embolus  Allergies  Allergen Reactions   Nsaids Other (See Comments)    Pt states all NSAIDS does not sit well onstomach   Aspirin Other (See Comments)    Hurts stomach   Codeine Nausea And Vomiting   Nitroglycerin Other (See Comments)    Heart rate drops    Patient Measurements: Height: '5\' 3"'$  (160 cm) Weight: 72 kg (158 lb 11.7 oz) IBW/kg (Calculated) : 52.4 Heparin Dosing Weight: 67.5kg  Vital Signs: Temp: 99.1 F (37.3 C) (10/09 2021) Temp Source: Oral (10/09 2021) BP: 109/63 (10/09 2145) Pulse Rate: 106 (10/09 2145)  Labs: Recent Labs    02/06/22 1529 02/06/22 2031  HGB 9.6*  --   HCT 29.8*  --   PLT 422*  --   CREATININE 1.89*  --   TROPONINIHS 47* 42*    Estimated Creatinine Clearance: 21.4 mL/min (A) (by C-G formula based on SCr of 1.89 mg/dL (H)).   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 Most likely scar. Mild subendocardial ischemia. EF 71% LOW RISK.    Cervical disc disease    fracture   Chronic kidney disease    COPD (chronic obstructive pulmonary disease) (H[facility])    Diverticulosis    Hyperlipidemia    Hypertension    Hypothyroidism (acquired)    hypo   Myocardial infarction ([place]) 11/2014   TIA   Polymyalgia rheumatica ([place])    2011 Dr. Charlestine Night   Rheumatoid arthritis [facility]) 2011   Dr.Truslow; fracture knees, hands  and wrists -    S/P CABG x 1 1996   CABG--LIMA-LAD for ostial LAD (not felt to be PCI amenable). EF NORMAL then; LIMA now atretic   Shortness of breath dyspnea    with exertion   Stroke ([place]) 11/2014   TIA    Urinary frequency     Assessment: 75 YOF presenting with somnolence, Ct angio chest with PE, has been on lovenox 40 mg SQ q 24h PTA (last dose 10/9) s/p hip surgery.    Goal of Therapy:  Heparin level 0.3-0.7 units/ml Monitor platelets by anticoagulation protocol: Yes   Plan:  Heparin 4000 units IV x 1, and gtt at 1150 units/hr F/u 8 hour heparin level F/u long term [facility] plan  Bertis Ruddy, PharmD Clinical Pharmacist ED Pharmacist Phone # [phone] 02/06/2022 10:34 PM

## 2022-02-06 NOTE — Assessment & Plan Note (Signed)
takes Famotidine, Pantoprazole, stable.  

## 2022-02-06 NOTE — Assessment & Plan Note (Signed)
on Atorvastatin 

## 2022-02-06 NOTE — Progress Notes (Signed)
Pharmacy Antibiotic Note  STACEYANN KNOUFF is a 81 y.o. female admitted on 02/06/2022 with presenting with increased drowsiness, .  Pharmacy has been consulted for cefepime dosing.  Plan: Cefepime 2g IV every 24 hours Monitor renal function, Cx and clinical progression to narrow  Height: '5\' 3"'$  (160 cm) Weight: 72 kg (158 lb 11.7 oz) IBW/kg (Calculated) : 52.4  Temp (24hrs), Avg:98.9 F (37.2 C), Min:97.3 F (36.3 C), Max:99.5 F (37.5 C)  Recent Labs  Lab 02/06/22 1529  WBC 14.5*  CREATININE 1.89*    Estimated Creatinine Clearance: 21.4 mL/min (A) (by C-G formula based on SCr of 1.89 mg/dL (H)).    Allergies  Allergen Reactions   Nsaids Other (See Comments)    Pt states all NSAIDS does not sit well onstomach   Aspirin Other (See Comments)    Hurts stomach   Codeine Nausea And Vomiting   Nitroglycerin Other (See Comments)    Heart rate drops    Bertis Ruddy, PharmD Clinical Pharmacist ED Pharmacist Phone # [phone] 02/06/2022 8:07 PM

## 2022-02-06 NOTE — ED Triage Notes (Addendum)
Patient BIB [facility] from [facility] where she is staying after a hip replacement, EMS was called by patient's husband for evaluation of increased drowsiness. Patient's only complains of constipation for the last few days.  Patient is alert, oriented, and in no apparent distress at this time.  EMS Vitals 92% on room air

## 2022-02-06 NOTE — Assessment & Plan Note (Signed)
creat 1.83 01/30/22, followed by Nephrology

## 2022-02-06 NOTE — Assessment & Plan Note (Signed)
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

## 2022-02-06 NOTE — H&P (Signed)
History and Physical   [name] ZOX:[id number] DOB: February 14, 1941 DOA: 02/06/2022  PCP: Cassandria Anger, MD   Patient coming from: [facility]  Chief Complaint: Increased drowsiness and fatigue, shortness of breath  HPI: Sherry VAZGUEZ is a 81 y.o. female with medical history significant of hypertension, rheumatoid arthritis, macular degeneration, status post splenectomy, mesenteric artery stenosis, status post AAA repair, CAD status post CABG, CKD 4, hypothyroidism, history of TIA, COPD, history of DVT, GERD presenting with increased drowsiness, fatigue, shortness of breath.  [facility] number of weeks ago.  She has been doing somewhat unwell they are not rehabbing very well.  She is noted to have a DVT recently and was placed on prophylaxis dose Lovenox but has had increasing shortness of breath, fatigue and drowsiness since then.  Also reporting 3 days of constipation.  She denies fevers, chills, chest pain, abdominal pain, diarrhea, nausea, vomiting.  ED Course: Vital signs in ED significant for heart rate in the 90s to 100s, blood pressure in the 811B to 147W systolic, respiratory rate in the teens to 20s, requiring 4 L to maintain saturations.  Lab work-up included CMP with sodium 132, bicarb 20, BUN 33, creatinine stable at 1.89, glucose 122, calcium 8.2, albumin 2.4, AST 63.  CBC with leukocytosis to 14.5, hemoglobin of 9.6 which is down from 13 days ago of 11.  Platelets 422.  Troponin flat at 47 and then 42 on repeat.  BNP elevated to 653.  COVID screen negative.  Urinalysis positive for leukocytes and rare bacteria only.  Imaging included chest x-ray which showed small bilateral effusions and basilar atelectasis.  CT head showed no acute abnormality.  CTA PE study showed isolated subsegmental PE on the right middle lobe, small pleural effusions, mild bronchial thickening in the lower lobes.  Magnesium noted to be 1.3  as well.  Patient received dose of vancomycin and cefepime in the ED during the course of work-up, was started on heparin drip, received 2 g magnesium IV and a liter of fluids.  Review of Systems: As per HPI otherwise all other systems reviewed and are negative.  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 Most likely scar. Mild subendocardial ischemia. EF 71% LOW RISK.    Cervical disc disease    fracture   Chronic kidney disease    COPD (chronic obstructive pulmonary disease) (HCC)    Diverticulosis    Hyperlipidemia    Hypertension    Hypothyroidism (acquired)    hypo   Myocardial infarction ([place]) 11/2014   TIA   Polymyalgia rheumatica ([place])    2011 Dr. Charlestine Night   Rheumatoid arthritis [facility]) 2011   Dr.Truslow; fracture knees, hands and wrists -    S/P CABG x 1 1996  CABG--LIMA-LAD for ostial LAD (not felt to be PCI amenable). EF NORMAL then; LIMA now atretic   Shortness of breath dyspnea    with exertion   Stroke ([place]) 11/2014   TIA    Urinary frequency     Past Surgical History:  Procedure Laterality Date   ABDOMINAL AORTIC ANEURYSM REPAIR  5170   Complicated by mesenteric artery stenosis and splenic artery infarction with acquired Asplenia   APPENDECTOMY     BUNIONECTOMY  07/2011   right foot   CARDIAC CATHETERIZATION  2005   (Most recent CATH) - ostial LAD lesion  20-30% (down from 90% initially). Atretic LIMA. Minimal disease the RCA and Circumflex system.   CARPAL TUNNEL RELEASE Left    CATARACT EXTRACTION Bilateral    CERVICAL SPINE SURGERY     plate 2008 Dr. Saintclair Halsted   COLONOSCOPY     CORONARY ARTERY BYPASS GRAFT  1996   INCLUDED AN INTERNAL MAMMARY ARTERY TO THE LAD. EF WAS NORMAL   INGUINAL HERNIA REPAIR Right    LAPAROSCOPIC APPENDECTOMY N/A 06/28/2016   Procedure: APPENDECTOMY LAPAROSCOPIC;  Surgeon: Leighton Ruff, MD;  Location: [facility];  Service: General;  Laterality: N/A;   NM MYOVIEW LTD  06/2010   Fixed anteroseptal, apical and inferoapical defect with moderate size. Most likely scar. Mild subendocardial ischemia. EF 71% LOW RISK.    SPLENECTOMY     TOTAL HIP ARTHROPLASTY Left 01/23/2022   Procedure: LEFT TOTAL HIP ARTHROPLASTY ANTERIOR APPROACH;  Surgeon: Leandrew Koyanagi, MD;  Location: [facility];  Service: Orthopedics;  Laterality: Left;  3-C   TRANSTHORACIC ECHOCARDIOGRAM  12/2014   [facility]: Normal LV size & function. EF 55-60%,    vagina polyp     VIDEO ASSISTED THORACOSCOPY (VATS)/WEDGE RESECTION Left 03/02/2015   Procedure: VIDEO ASSISTED THORACOSCOPY (VATS), MINI THORACOTOMY, LEFT UPPER LOBE WEDGE, TAKE DOWN OF INTERNAL MAMMARY LESIONS, PLACEMENT OF ON-Q PUMP;  Surgeon: Grace Isaac, MD;  Location: [place];  Service: Thoracic;  Laterality: Left;   VIDEO BRONCHOSCOPY N/A 03/02/2015   Procedure: BRONCHOSCOPY;  Surgeon: Grace Isaac, MD;  Location: [place];  Service: Thoracic;  Laterality: N/A;   VIDEO BRONCHOSCOPY WITH ENDOBRONCHIAL NAVIGATION N/A 10/08/2017   Procedure: VIDEO BRONCHOSCOPY WITH ENDOBRONCHIAL NAVIGATION WITH BIOPSIES OF LEFT UPPER LOBE AND LEFT LOWER LOBE;  Surgeon: Grace Isaac, MD;  Location: [place];  Service: Thoracic;  Laterality: N/A;    Social History  reports that she quit smoking about 63 years ago. Her smoking use included cigarettes. She has never been exposed to tobacco smoke. She has never  used smokeless tobacco. She reports current alcohol use. She reports that she does not use drugs.  Allergies  Allergen Reactions   Nsaids Other (See Comments)    Pt states all NSAIDS does not sit well onstomach   Aspirin Other (See Comments)    Hurts stomach   Codeine Nausea And Vomiting   Nitroglycerin Other (See Comments)    Heart rate drops    Family History  Problem Relation Age of Onset   Hypertension Mother    Diabetes Mother    Heart disease Mother    Hyperlipidemia Mother    Stroke Father    Hyperlipidemia Sister    Hypertension Sister    Hypertension Daughter    Cancer Paternal Uncle        Deceased from cancer not sure of site   Hypertension Son    Hyperlipidemia Son    Hyperlipidemia Son  Hypertension Son    Hyperlipidemia Son    Hypertension Son    Hypertension Other    Coronary artery disease Other    Asthma Neg Hx    Colon cancer Neg Hx   Reviewed on admission  Prior to Admission medications   Medication Sig Start Date End Date Taking? Authorizing Provider  albuterol (VENTOLIN HFA) 108 (90 Base) MCG/ACT inhaler Inhale 1-2 puffs into the lungs every 4 (four) hours as needed for wheezing or shortness of breath.   Yes [provider]  amLODipine (NORVASC) 2.5 MG tablet Take 1 tablet (2.5 mg total) by mouth daily. 09/29/21  Yes Plotnikov, [name], MD  atorvastatin (LIPITOR) 80 MG tablet Take 80 mg by mouth daily.   Yes [provider]  Cholecalciferol (VITAMIN D3) 50 MCG (2000 UT) capsule Take 1 capsule (2,000 Units total) by mouth daily. 11/13/18  Yes Plotnikov, [name], MD  Cyanocobalamin 2500 MCG TABS Take 2,500 mcg by mouth daily.   Yes [provider]  docusate sodium (COLACE) 100 MG capsule Take 100 mg by mouth 2 (two) times daily.   Yes [provider]  enoxaparin (LOVENOX) 40 MG/0.4ML injection Inject 0.4 mLs (40 mg total) into the skin daily for 28 days. 01/30/22 02/27/22 Yes Aundra Dubin, PA-C  famotidine  (PEPCID) 20 MG tablet Take 20 mg by mouth 2 (two) times daily.   Yes [provider]  gabapentin (NEURONTIN) 300 MG capsule Take 300 mg by mouth 3 (three) times daily.   Yes [provider]  Golimumab (SIMPONI Lucas) Take 2 puffs by mouth daily.   Yes [provider]  hydrochlorothiazide (HYDRODIURIL) 12.5 MG tablet TAKE 1 TABLET BY MOUTH EVERY DAY Patient taking differently: Take 12.5 mg by mouth daily. 04/08/21  Yes Tanda Rockers, MD  leflunomide (ARAVA) 20 MG tablet Take 20 mg by mouth daily.    Yes [provider]  levothyroxine (SYNTHROID) 88 MCG tablet TAKE 1 TABLET (88 MCG TOTAL) BY MOUTH DAILY BEFORE BREAKFAST. 01/09/22  Yes Plotnikov, [name], MD  metoprolol tartrate (LOPRESSOR) 50 MG tablet Take 50 mg by mouth 2 (two) times daily.   Yes [provider]  Multiple Vitamins-Minerals (CENTRUM ADULT PO) Take 1 tablet by mouth daily. 1 Tablet Daily.   Yes [provider]  oxyCODONE-acetaminophen (PERCOCET/ROXICET) 5-325 MG tablet Take 1 tablet by mouth every 6 (six) hours as needed for severe pain.   Yes [provider]  pantoprazole (PROTONIX) 40 MG tablet Take 40 mg by mouth daily.   Yes [provider]  Tiotropium Bromide-Olodaterol (STIOLTO RESPIMAT) 2.5-2.5 MCG/ACT AERS Inhale 2 each into the lungs at bedtime.   Yes [provider]  triamcinolone (NASACORT) 55 MCG/ACT AERO nasal inhaler Place 1 spray into the nose daily.   Yes [provider]  cefadroxil (DURICEF) 500 MG capsule Take 1 capsule (500 mg total) by mouth 2 (two) times daily. Patient not taking: Reported on 02/06/2022 01/27/22   Aundra Dubin, PA-C  loperamide (IMODIUM A-D) 2 MG tablet Take 2 mg by mouth 4 (four) times daily as needed for diarrhea or loose stools. Patient not taking: Reported on 02/06/2022    [provider]  methocarbamol (ROBAXIN) 500 MG tablet Take 1 tablet (500 mg total) by mouth 2 (two) times daily as  needed. Patient not taking: Reported on 02/06/2022 01/16/22   Aundra Dubin, PA-C  ondansetron (ZOFRAN) 4 MG tablet Take 4 mg by mouth every 8 (eight) hours as needed for  nausea or vomiting. Patient not taking: Reported on 02/06/2022    [provider]    Physical Exam: Vitals:   02/06/22 2145 02/06/22 2230 02/06/22 2303 02/06/22 2304  BP: 109/63 (!) 128/48 (!) 170/66   Pulse: (!) 106 97 94 92  Resp: 19 17 (!) 28 19  Temp:      TempSrc:      SpO2: 92% 95% 99% 96%  Weight:      Height:        Physical Exam Constitutional:      General: She is not in acute distress.    Comments: Tired appearing and somewhat lethargic when seen.  Falls back asleep easily.  HENT:     Head: Normocephalic and atraumatic.     Mouth/Throat:     Mouth: Mucous membranes are moist.     Pharynx: Oropharynx is clear.  Eyes:     Extraocular Movements: Extraocular movements intact.     Pupils: Pupils are equal, round, and reactive to light.  Cardiovascular:     Rate and Rhythm: Normal rate and regular rhythm.     Pulses: Normal pulses.     Heart sounds: Normal heart sounds.  Pulmonary:     Effort: Pulmonary effort is normal. No respiratory distress.     Breath sounds: Rales present.  Abdominal:     General: Bowel sounds are normal. There is no distension.     Palpations: Abdomen is soft.     Tenderness: There is no abdominal tenderness.  Musculoskeletal:        General: No swelling or deformity.     Left lower leg: Edema present.  Skin:    General: Skin is warm and dry.  Neurological:     General: No focal deficit present.     Mental Status: Mental status is at baseline.    Labs on Admission: I have personally reviewed following labs and imaging studies  CBC: Recent Labs  Lab 02/06/22 1529  WBC 14.5*  HGB 9.6*  HCT 29.8*  MCV 101.4*  PLT 422*    Basic Metabolic Panel: Recent Labs  Lab 02/06/22 1529  NA 133*  K 4.3  CL 98  CO2 20*  GLUCOSE 122*  BUN 33*  CREATININE  1.89*  CALCIUM 8.2*  MG 1.3*    GFR: Estimated Creatinine Clearance: 21.4 mL/min (A) (by C-G formula based on SCr of 1.89 mg/dL (H)).  Liver Function Tests: Recent Labs  Lab 02/06/22 1529  AST 63*  ALT 36  ALKPHOS 123  BILITOT 0.5  PROT 6.5  ALBUMIN 2.4*    Urine analysis:    Component Value Date/Time   COLORURINE AMBER (A) 02/06/2022 1800   APPEARANCEUR CLOUDY (A) 02/06/2022 1800   LABSPEC 1.014 02/06/2022 1800   PHURINE 5.0 02/06/2022 1800   GLUCOSEU NEGATIVE 02/06/2022 1800   GLUCOSEU NEGATIVE 11/14/2018 1508   HGBUR NEGATIVE 02/06/2022 1800   BILIRUBINUR NEGATIVE 02/06/2022 1800   KETONESUR NEGATIVE 02/06/2022 1800   PROTEINUR 100 (A) 02/06/2022 1800   UROBILINOGEN 0.2 11/14/2018 1508   NITRITE NEGATIVE 02/06/2022 1800   LEUKOCYTESUR MODERATE (A) 02/06/2022 1800    Radiological Exams on Admission: CT Angio Chest PE W and/or Wo Contrast  Result Date: 02/06/2022 CLINICAL DATA:  Provided history: Pulmonary embolism (PE) suspected, high prob reduce dose contrast Dural venous and constipation. Recent hip replacement. EXAM: CT ANGIOGRAPHY CHEST WITH CONTRAST TECHNIQUE: Multidetector CT imaging of the chest was performed using the standard protocol during bolus administration of intravenous contrast. Multiplanar CT  image reconstructions and MIPs were obtained to evaluate the vascular anatomy. RADIATION DOSE REDUCTION: This exam was performed according to the departmental dose-optimization program which includes automated exposure control, adjustment of the mA and/or kV according to patient size and/or use of iterative reconstruction technique. CONTRAST:  80 cc Omnipaque 350 IV COMPARISON:  Radiograph earlier today. Chest CT 03/17/2021. FINDINGS: Cardiovascular: Isolated subsegmental embolus in the medial right middle lobe pulmonary artery, for example series 9, image 171 and series 10, image 58. No additional pulmonary emboli. Calcified and noncalcified atheromatous plaque  throughout the thoracic aorta. No aneurysm or dissection. Post CABG. Upper normal heart size. No pericardial effusion. Mediastinum/Nodes: No enlarged mediastinal or hilar lymph nodes. No visible thyroid nodule. Tiny hiatal hernia. Lungs/Pleura: Small right and trace left pleural effusion. Associated atelectasis. The previous subpleural right lower lobe nodule is obscured on the current exam due to pleural fluid and atelectasis. Postsurgical change in the left upper lobe. There is mild bronchial thickening in the lower lobes. Upper Abdomen: No acute upper abdominal findings. Elongated left lobe of the liver. Musculoskeletal: Stable punctate sclerotic focus within T4 vertebral body likely a bone island. Median sternotomy. No acute osseous findings. Review of the MIP images confirms the above findings. IMPRESSION: 1. Isolated subsegmental embolus in the medial right middle lobe pulmonary artery. 2. Small right and trace left pleural effusion. Associated atelectasis. 3. Mild bronchial thickening in the lower lobes. Aortic Atherosclerosis (ICD10-I70.0). Critical Value/emergent results were called by telephone at the time of interpretation on 02/06/2022 at 10:25 pm to provider DAVID YAO , who verbally acknowledged these results. Electronically Signed   By: Keith Rake M.D.   On: 02/06/2022 22:26   CT Head Wo Contrast  Result Date: 02/06/2022 CLINICAL DATA:  Mental status change, unknown cause EXAM: CT HEAD WITHOUT CONTRAST TECHNIQUE: Contiguous axial images were obtained from the base of the skull through the vertex without intravenous contrast. RADIATION DOSE REDUCTION: This exam was performed according to the departmental dose-optimization program which includes automated exposure control, adjustment of the mA and/or kV according to patient size and/or use of iterative reconstruction technique. COMPARISON:  02/15/2020 FINDINGS: Brain: No evidence of acute infarction, hemorrhage, mass, mass effect, or midline  shift. No hydrocephalus or extra-axial fluid collection. Mildly advanced cerebral atrophy for age. Vascular: No hyperdense vessel. Skull: Normal. Negative for fracture or focal lesion. Sinuses/Orbits: No acute finding. Status post bilateral lens replacements. Other: The mastoid air cells are well aerated. IMPRESSION: No acute intracranial process. Electronically Signed   By: Merilyn Baba M.D.   On: 02/06/2022 14:50   DG Chest 2 View  Result Date: 02/06/2022 CLINICAL DATA:  Cough EXAM: CHEST - 2 VIEW COMPARISON:  Chest x-ray dated Sep 26, 2019 FINDINGS: Cardiac and mediastinal contours are within normal limits. Prior median sternotomy and CABG. Small bilateral pleural effusions and bibasilar atelectasis. No evidence of pneumothorax. IMPRESSION: Small bilateral pleural effusions and bibasilar atelectasis. Electronically Signed   By: Yetta Glassman M.D.   On: 02/06/2022 14:49    EKG: Independently reviewed.  Right left arm reversal.  Sinus rhythm at 99 bpm.  Assessment/Plan Active Problems:   CKD (chronic kidney disease) stage 4, GFR 15-29 ml/min (HCC)   Essential hypertension   Rheumatoid arthritis (HCC)   Hypothyroidism   Macular degeneration (senile) of retina   ASPLENIA   Mesenteric artery stenosis (HCC)   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)   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; now atretic  and significantly improved LAD lesion without intervention   Dyslipidemia, goal LDL below 70   COPD GOLD I   GERD (gastroesophageal reflux disease)   Pulmonary embolism > Patient with known recent DVT on prophylaxis dose Lovenox only per report.  Presenting with increasing fatigue, shortness of breath, drowsiness. > Initially hypoxic in the ED requiring 4 L to maintain saturations. > CT positive for isolated subsegmental pulmonary embolus in the right middle lobe.  Also noted was small pleural effusion and mild bronchial thickening in the lower lobes. >  BNP elevated to 63 and troponin flat at 47 and then 42 on repeat, likely all related to a degree of strain from PE. > Patient started on heparin drip in the ED. - Monitor on telemetry unit - Continue with heparin drip overnight - Wean oxygen as tolerated - Echocardiogram - Trend CBC  Leukocytosis > Patient noted to have leukocytosis to 14.5 in ED.  During initial work-up patient given broad-spectrum antibiotics of vancomycin and cefepime.  Urinalysis showed only leukocytes and rare bacteria.  Chest imaging negative for any acute abnormality. > Suspect this is likely reactive given her acute pulmonary embolism. - Hold off on further antibiotics for now - Trend fever curve and WBC  Anemia > Hemoglobin near baseline at 9.6 but is still down from baseline closer to 11. - Continue to monitor CBC while on heparin  CKD 4 > Creatinine stable 1.9 in the ED.  Did receive CTA study for PE.  Also received 1 L of IV fluids. - Trend renal function electrolytes  Hypertension - Continue home amlodipine, hydrochlorothiazide, metoprolol  Rheumatoid arthritis - Continue home golimumab, leflunomide   CAD > Status post CABG - Continue home metoprolol, atorvastatin   Hypothyroidism - Continue home Synthroid  History of TIA - Continue home atorvastatin  COPD - Replace home Stiolto with formulary Brovana and Incruse - Continue home as needed albuterol  GERD - Continue home PPI and Pepcid  Status post AAA repair Mesenteric artery stenosis related to this repair - Continue home atorvastatin - Previously on Plavix  Asplenia, status post splenectomy - Noted  Macular degeneration - Noted  DVT prophylaxis: Heparin Code Status:   Full Family Communication:  Called husband by phone, but there was no answer.  Disposition Plan:   [facility] is from:  [facility]  Anticipated DC to:  Pending clinical course  Anticipated DC date:  1 to 3 days  Anticipated DC barriers: None unless  patient requests different rehab facility  Consults called:  None Admission status:  Observation, telemetry  Severity of Illness: The appropriate patient status for this patient is OBSERVATION. Observation status is judged to be reasonable and necessary in order to provide the required intensity of service to ensure the patient's safety. The patient's presenting symptoms, physical exam findings, and initial radiographic and laboratory data in the context of their medical condition is felt to place them at decreased risk for further clinical deterioration. Furthermore, it is anticipated that the patient will be medically stable for discharge from the hospital within 2 midnights of admission.    Marcelyn Bruins MD [facility]  How to contact the [facility] Attending or Consulting provider [place] or covering provider during after hours [place], for this patient?   Check the care team in [facility] and look for a) attending/consulting [facility] provider listed and b) the [facility] team listed Log into [email or web address] and use [place]'s universal password to access. If you do not have the  password, please contact the hospital operator. Locate the [facility] provider you are looking for under [facility] and page to a number that you can be directly reached. If you still have difficulty reaching the provider, please page the [facility] (Director on Call) for the Hospitalists listed on amion for assistance.  02/06/2022, 11:06 PM

## 2022-02-06 NOTE — Assessment & Plan Note (Signed)
Blood pressure is controlled,  takes Amlodipine, HCTZ, Metoprolol

## 2022-02-06 NOTE — Assessment & Plan Note (Signed)
Hydrocortisone cream, avoid constipation, pending CBC, FOBT, Fe, Vit B12, Folate

## 2022-02-06 NOTE — ED Provider Triage Note (Signed)
Emergency Medicine Provider Triage Evaluation Note  [name] , a 81 y.o. female  was evaluated in triage.  Pt brought in from [facility] for increased drowsiness per husband. Had hip replacement a few weeks ago and has been staying at rehab since. Patient complains of a cough and constipation, no other complaints. Can tell me her name but does not know where she's been staying.   Review of Systems  Positive: As above Negative: Headache, CP, SOB  Physical Exam  BP 114/78 (BP Location: Right Arm)   Pulse 82   Temp 99.5 F (37.5 C) (Oral)   Resp 16   SpO2 99%  Gen:   Awake, no distress   Resp:  Normal effort  MSK:   Moves extremities without difficulty  Other:    Medical Decision Making  Medically screening exam initiated at 1:41 PM.  Appropriate orders placed.  [name] was informed that the remainder of the evaluation will be completed by another provider, this initial triage assessment does not replace that evaluation, and the importance of remaining in the ED until their evaluation is complete.  Workup initiated for AMS   Tobin Witucki T, PA-C 02/06/22 1344

## 2022-02-06 NOTE — ED Notes (Signed)
Admitting MD at bedside.

## 2022-02-06 NOTE — Assessment & Plan Note (Signed)
Foley, f/u Urology

## 2022-02-06 NOTE — ED Provider Notes (Signed)
[name] Provider Note   CSN: [id number] Arrival date & time: 02/06/22  1256     History  Chief Complaint  Patient presents with   Somnolence    [name] is a 81 y.o. female.  HPI  Patient presented to the emergency department today due to concern for somnolence.  Patient recently underwent a left hip replacement on 9/26.  Patient is currently in her assisted living facility.  Patient states that she has been having difficulty with ambulation since her surgery, she is not having significant pain in the hip however she does endorse that she is having difficulty with ambulation she is only walked a few times.  Patient states that she is undergoing rehab.  She is being brought to the emergency department today due to her being significantly lethargic and be difficult to arouse at her nursing facility.  Patient has been lethargic since the procedure however today was the worst day and therefore she is coming to the emergency department.  Patient states that she has had a cough but no fevers.  She endorses that she has had significant swelling in her left leg.  She notes that she is on Plavix for anticoagulation.  She denies any fevers, sputum production, chest pain.  She does endorse that she has had occasional shortness of breath.  She notes that she can lie flat.  Patient has recently had a left lower extremity DVT has been evaluated at her nursing home.  Patient is on low-dose Eliquis    Home Medications Prior to Admission medications   Medication Sig Start Date End Date Taking? Authorizing Provider  albuterol (VENTOLIN HFA) 108 (90 Base) MCG/ACT inhaler Inhale 1-2 puffs into the lungs every 4 (four) hours as needed for wheezing or shortness of breath.   Yes [provider]  amLODipine (NORVASC) 2.5 MG tablet Take 1 tablet (2.5 mg total) by mouth daily. 09/29/21  Yes Plotnikov, [name], MD  atorvastatin (LIPITOR) 80 MG tablet Take 80  mg by mouth daily.   Yes [provider]  Cholecalciferol (VITAMIN D3) 50 MCG (2000 UT) capsule Take 1 capsule (2,000 Units total) by mouth daily. 11/13/18  Yes Plotnikov, [name], MD  Cyanocobalamin 2500 MCG TABS Take 2,500 mcg by mouth daily.   Yes [provider]  docusate sodium (COLACE) 100 MG capsule Take 100 mg by mouth 2 (two) times daily.   Yes [provider]  enoxaparin (LOVENOX) 40 MG/0.4ML injection Inject 0.4 mLs (40 mg total) into the skin daily for 28 days. 01/30/22 02/27/22 Yes Aundra Dubin, PA-C  famotidine (PEPCID) 20 MG tablet Take 20 mg by mouth 2 (two) times daily.   Yes [provider]  gabapentin (NEURONTIN) 300 MG capsule Take 300 mg by mouth 3 (three) times daily.   Yes [provider]  Golimumab (SIMPONI [place]) Take 2 puffs by mouth daily.   Yes [provider]  hydrochlorothiazide (HYDRODIURIL) 12.5 MG tablet TAKE 1 TABLET BY MOUTH EVERY DAY Patient taking differently: Take 12.5 mg by mouth daily. 04/08/21  Yes Tanda Rockers, MD  leflunomide (ARAVA) 20 MG tablet Take 20 mg by mouth daily.    Yes [provider]  levothyroxine (SYNTHROID) 88 MCG tablet TAKE 1 TABLET (88 MCG TOTAL) BY MOUTH DAILY BEFORE BREAKFAST. 01/09/22  Yes Plotnikov, [name], MD  metoprolol tartrate (LOPRESSOR) 50 MG tablet Take 50 mg by mouth 2 (two) times daily.   Yes [provider]  Multiple Vitamins-Minerals (CENTRUM ADULT PO) Take 1 tablet by mouth daily. 1 Tablet Daily.   Yes [provider]  oxyCODONE-acetaminophen (PERCOCET/ROXICET) 5-325 MG tablet Take 1 tablet by mouth every 6 (six) hours as needed for severe pain.   Yes [provider]  pantoprazole (PROTONIX) 40 MG tablet Take 40 mg by mouth daily.   Yes [provider]  Tiotropium Bromide-Olodaterol (STIOLTO RESPIMAT) 2.5-2.5 MCG/ACT AERS Inhale 2 each into the lungs at bedtime.   Yes [provider]  triamcinolone (NASACORT) 55  MCG/ACT AERO nasal inhaler Place 1 spray into the nose daily.   Yes [provider]      Allergies    Nsaids, Aspirin, Codeine, and Nitroglycerin    Review of Systems   Review of Systems  Physical Exam Updated Vital Signs BP (!) 165/55   Pulse 96   Temp 99.1 F (37.3 C) (Oral)   Resp (!) 29   Ht '5\' 3"'$  (1.6 m)   Wt 72 kg   SpO2 98%   BMI 28.12 kg/m  Physical Exam Vitals and nursing note reviewed.  Constitutional:      General: She is not in acute distress.    Appearance: She is well-developed.     Comments: Frail, chronically ill-appearing  HENT:     Head: Normocephalic and atraumatic.  Eyes:     Conjunctiva/sclera: Conjunctivae normal.  Cardiovascular:     Rate and Rhythm: Regular rhythm. Tachycardia present.     Heart sounds: No murmur heard. Pulmonary:     Effort: Pulmonary effort is normal. No respiratory distress.     Breath sounds: Normal breath sounds.  Abdominal:     Palpations: Abdomen is soft.     Tenderness: There is no abdominal tenderness.  Musculoskeletal:        General: No swelling or tenderness.     Cervical back: Neck supple.     Left lower leg: Edema present.  Skin:    General: Skin is warm and dry.     Capillary Refill: Capillary refill takes less than 2 seconds.     Comments: Well-healed surgical wound on left lateral hip.  No surrounding erythema or warmth, no purulent drainage  Neurological:     General: No focal deficit present.     Mental Status: She is alert and oriented to person, place, and time.  Psychiatric:        Mood and Affect: Mood normal.     ED Results / Procedures / Treatments   Labs (all labs ordered are listed, but only abnormal results are displayed) Labs Reviewed  COMPREHENSIVE METABOLIC PANEL - Abnormal; Notable for the following components:      Result Value   Sodium 133 (*)    CO2 20 (*)    Glucose, Bld 122 (*)    BUN 33 (*)    Creatinine, Ser 1.89 (*)    Calcium 8.2 (*)    Albumin 2.4 (*)    AST  63 (*)    GFR, Estimated 26 (*)    All other components within normal limits  CBC - Abnormal; Notable for the following components:   WBC 14.5 (*)    RBC 2.94 (*)    Hemoglobin 9.6 (*)    HCT 29.8 (*)    MCV 101.4 (*)    RDW 16.6 (*)    Platelets 422 (*)    nRBC 1.2 (*)    All other components within normal limits  URINALYSIS, ROUTINE W REFLEX MICROSCOPIC - Abnormal;  Notable for the following components:   Color, Urine AMBER (*)    APPearance CLOUDY (*)    Protein, ur 100 (*)    Leukocytes,Ua MODERATE (*)    Bacteria, UA RARE (*)    All other components within normal limits  BRAIN NATRIURETIC PEPTIDE - Abnormal; Notable for the following components:   B Natriuretic Peptide 653.8 (*)    All other components within normal limits  MAGNESIUM - Abnormal; Notable for the following components:   Magnesium 1.3 (*)    All other components within normal limits  TROPONIN I (HIGH SENSITIVITY) - Abnormal; Notable for the following components:   Troponin I (High Sensitivity) 47 (*)    All other components within normal limits  TROPONIN I (HIGH SENSITIVITY) - Abnormal; Notable for the following components:   Troponin I (High Sensitivity) 42 (*)    All other components within normal limits  SARS CORONAVIRUS 2 BY RT PCR  URINE CULTURE  CBC  HEPARIN LEVEL (UNFRACTIONATED)  COMPREHENSIVE METABOLIC PANEL  CBG MONITORING, ED    EKG None  Radiology CT Angio Chest PE W and/or Wo Contrast  Result Date: 02/06/2022 CLINICAL DATA:  Provided history: Pulmonary embolism (PE) suspected, high prob reduce dose contrast Dural venous and constipation. Recent hip replacement. EXAM: CT ANGIOGRAPHY CHEST WITH CONTRAST TECHNIQUE: Multidetector CT imaging of the chest was performed using the standard protocol during bolus administration of intravenous contrast. Multiplanar CT image reconstructions and MIPs were obtained to evaluate the vascular anatomy. RADIATION DOSE REDUCTION: This exam was performed  according to the departmental dose-optimization program which includes automated exposure control, adjustment of the mA and/or kV according to patient size and/or use of iterative reconstruction technique. CONTRAST:  80 cc Omnipaque 350 IV COMPARISON:  Radiograph earlier today. Chest CT 03/17/2021. FINDINGS: Cardiovascular: Isolated subsegmental embolus in the medial right middle lobe pulmonary artery, for example series 9, image 171 and series 10, image 58. No additional pulmonary emboli. Calcified and noncalcified atheromatous plaque throughout the thoracic aorta. No aneurysm or dissection. Post CABG. Upper normal heart size. No pericardial effusion. Mediastinum/Nodes: No enlarged mediastinal or hilar lymph nodes. No visible thyroid nodule. Tiny hiatal hernia. Lungs/Pleura: Small right and trace left pleural effusion. Associated atelectasis. The previous subpleural right lower lobe nodule is obscured on the current exam due to pleural fluid and atelectasis. Postsurgical change in the left upper lobe. There is mild bronchial thickening in the lower lobes. Upper Abdomen: No acute upper abdominal findings. Elongated left lobe of the liver. Musculoskeletal: Stable punctate sclerotic focus within T4 vertebral body likely a bone island. Median sternotomy. No acute osseous findings. Review of the MIP images confirms the above findings. IMPRESSION: 1. Isolated subsegmental embolus in the medial right middle lobe pulmonary artery. 2. Small right and trace left pleural effusion. Associated atelectasis. 3. Mild bronchial thickening in the lower lobes. Aortic Atherosclerosis (ICD10-I70.0). Critical Value/emergent results were called by telephone at the time of interpretation on 02/06/2022 at 10:25 pm to provider DAVID YAO , who verbally acknowledged these results. Electronically Signed   By: Keith Rake M.D.   On: 02/06/2022 22:26   CT Head Wo Contrast  Result Date: 02/06/2022 CLINICAL DATA:  Mental status change,  unknown cause EXAM: CT HEAD WITHOUT CONTRAST TECHNIQUE: Contiguous axial images were obtained from the base of the skull through the vertex without intravenous contrast. RADIATION DOSE REDUCTION: This exam was performed according to the departmental dose-optimization program which includes automated exposure control, adjustment of the mA and/or kV according to  patient size and/or use of iterative reconstruction technique. COMPARISON:  02/15/2020 FINDINGS: Brain: No evidence of acute infarction, hemorrhage, mass, mass effect, or midline shift. No hydrocephalus or extra-axial fluid collection. Mildly advanced cerebral atrophy for age. Vascular: No hyperdense vessel. Skull: Normal. Negative for fracture or focal lesion. Sinuses/Orbits: No acute finding. Status post bilateral lens replacements. Other: The mastoid air cells are well aerated. IMPRESSION: No acute intracranial process. Electronically Signed   By: Merilyn Baba M.D.   On: 02/06/2022 14:50   DG Chest 2 View  Result Date: 02/06/2022 CLINICAL DATA:  Cough EXAM: CHEST - 2 VIEW COMPARISON:  Chest x-ray dated Sep 26, 2019 FINDINGS: Cardiac and mediastinal contours are within normal limits. Prior median sternotomy and CABG. Small bilateral pleural effusions and bibasilar atelectasis. No evidence of pneumothorax. IMPRESSION: Small bilateral pleural effusions and bibasilar atelectasis. Electronically Signed   By: Yetta Glassman M.D.   On: 02/06/2022 14:49    Procedures Procedures    Medications Ordered in ED Medications  heparin ADULT infusion 100 units/mL (25000 units/216m) (1,150 Units/hr Intravenous New Bag/Given 02/06/22 2303)  sodium chloride flush (NS) 0.9 % injection 3 mL (has no administration in time range)  acetaminophen (TYLENOL) tablet 650 mg (has no administration in time range)    Or  acetaminophen (TYLENOL) suppository 650 mg (has no administration in time range)  polyethylene glycol (MIRALAX / GLYCOLAX) packet 17 g (has no  administration in time range)  Golimumab SOAJ (has no administration in time range)  leflunomide (ARAVA) tablet 20 mg (has no administration in time range)  amLODipine (NORVASC) tablet 2.5 mg (has no administration in time range)  metoprolol tartrate (LOPRESSOR) tablet 50 mg (has no administration in time range)  hydrochlorothiazide (HYDRODIURIL) tablet 12.5 mg (has no administration in time range)  atorvastatin (LIPITOR) tablet 80 mg (has no administration in time range)  levothyroxine (SYNTHROID) tablet 88 mcg (has no administration in time range)  famotidine (PEPCID) tablet 20 mg (has no administration in time range)  pantoprazole (PROTONIX) EC tablet 40 mg (has no administration in time range)  gabapentin (NEURONTIN) capsule 300 mg (has no administration in time range)  albuterol (PROVENTIL) (2.5 MG/3ML) 0.083% nebulizer solution 3 mL (has no administration in time range)  arformoterol (BROVANA) nebulizer solution 15 mcg (has no administration in time range)    And  umeclidinium bromide (INCRUSE ELLIPTA) 62.5 MCG/ACT 1 puff (has no administration in time range)  vancomycin (VANCOCIN) IVPB 1000 mg/200 mL premix (0 mg Intravenous Stopped 02/06/22 2255)  ceFEPIme (MAXIPIME) 2 g in sodium chloride 0.9 % 100 mL IVPB (0 g Intravenous Stopped 02/06/22 2150)  sodium chloride 0.9 % bolus 1,000 mL (0 mLs Intravenous Stopped 02/06/22 2150)  magnesium sulfate IVPB 2 g 50 mL (0 g Intravenous Stopped 02/06/22 2255)  iohexol (OMNIPAQUE) 350 MG/ML injection 80 mL (80 mLs Intravenous Contrast Given 02/06/22 2213)  heparin bolus via infusion 4,000 Units (4,000 Units Intravenous Bolus from Bag 02/06/22 2303)    ED Course/ Medical Decision Making/ A&P                          Medical Decision Making Amount and/or Complexity of Data Reviewed Labs: ordered. Decision-making details documented in ED Course. Radiology: ordered. Decision-making details documented in ED Course. ECG/medicine tests:  Decision-making  details documented in ED Course.  Risk Prescription drug management. Decision regarding hospitalization.   Medical Decision Making  This patient is Presenting for Evaluation of fatigue, patient has a past  medical history CAD, DVT on prophylactic dose Eliquis, followed by hypothyroidism, which complicates their presentation.  Of which does require a range of treatment options, and is a complaint that involves a high risk of morbidity and mortality.  Arrived in ED by: POV  History obtained from: The patient  Limitations in history: none    At this time I am most concerned for pulmonary embolism. Also considering bacteremia, pneumonia CAD, acute on chronic heart failure, metabolic abnormality, urinary tract infection. Plan for laboratory and imaging study work-up    EKG: I interpreted the ECG. It reveals a sinus rhythm. The QTc, PR, and QRS are appropriate. There are no signs of acute ischemia or of significant electrical abnormalities. The ECG does not show a STEMI. There are no ST depressions. There are no T wave inversions. There is no evidence of a High-Grade Conduction Block.   Laboratory work-up significant for:  -Laboratory work-up revealed significant leukocytosis with WBC 14.5, hemoglobin close to baseline at 9.6, platelet count unremarkable.  CMP revealed creatinine at baseline, no significant metabolic abnormalities.  Magnesium 1.3, repleted.  BNP elevated at 650, initial troponin 47, delta 42, likely elevated in the setting of physiologic stress, EKG nonischemic.  Urinalysis with rare bacteria, no convincing evidence for significant urinary tract infection.   Radiologic work-up was significant for: -CT head with no acute abnormality.  Chest x-ray with bilateral small pleural effusions. CT PE study with subsegmental    Interventions and Interval History: -On arrival to the, patient was initially hypoxic to 78% however maintaining oxygen saturation the ED shoulder 90s on 2 L  nasal cannula.  Patient was intermittently tachycardic into the patient's first episode of hypoxia, intermittent episodes of tachycardia, and her history of DVT, there is high clinical concern at this point time for PE.  CT PE study evidence of subsegmental PE, patient was started on heparin. -Patient initial presentation undifferentiated, due to patient having small pleural effusion, patient being hypoxic on arrival, patient was started on broad-spectrum antibiotics due to concern for pneumonia.     Additional documents reviewed: Outside hospital records, nursing home records    Disposition: Due to the patients current presenting symptoms, physical exam findings, and the workup stated above, it is thought that the etiology of the patients current presentation is pulmonary embolism   ADMIT: Patient is thought to require admission for anticoagulation, telemetry monitoring. Patient will be admitted to hospitalist service. Please see in patient provider note for additional treatment plan details.   The plan for this patient was discussed with Dr. Darl Householder, who voiced agreement and who oversaw evaluation and treatment of this patient.     Clinical Complexity  A medically appropriate history, review of systems, and physical exam was performed.   I personally reviewed the lab and imaging studies discussed above.   MDM generated using voice dictation software and may contain dictation errors. Please contact me for any clarification or with any questions.            Final Clinical Impression(s) / ED Diagnoses Final diagnoses:  Single subsegmental pulmonary embolism without acute cor pulmonale [facility])    Rx / DC Orders ED Discharge Orders     None         Khy Pitre, [place], MD 02/06/22 2352    Drenda Freeze, MD 02/07/22 2312

## 2022-02-06 NOTE — Assessment & Plan Note (Signed)
takes Famotidine, Pantoprazole 

## 2022-02-06 NOTE — Assessment & Plan Note (Signed)
on Atorvastatin, Lovenox

## 2022-02-07 ENCOUNTER — Observation Stay (HOSPITAL_COMMUNITY): Payer: Medicare PPO

## 2022-02-07 ENCOUNTER — Other Ambulatory Visit: Payer: Self-pay | Admitting: Physician Assistant

## 2022-02-07 ENCOUNTER — Encounter (HOSPITAL_COMMUNITY): Payer: Self-pay | Admitting: Internal Medicine

## 2022-02-07 ENCOUNTER — Other Ambulatory Visit: Payer: Self-pay

## 2022-02-07 ENCOUNTER — Encounter: Payer: Medicare PPO | Admitting: Orthopaedic Surgery

## 2022-02-07 DIAGNOSIS — Z79899 Other long term (current) drug therapy: Secondary | ICD-10-CM | POA: Diagnosis not present

## 2022-02-07 DIAGNOSIS — K551 Chronic vascular disorders of intestine: Secondary | ICD-10-CM | POA: Diagnosis present

## 2022-02-07 DIAGNOSIS — M069 Rheumatoid arthritis, unspecified: Secondary | ICD-10-CM | POA: Diagnosis present

## 2022-02-07 DIAGNOSIS — J449 Chronic obstructive pulmonary disease, unspecified: Secondary | ICD-10-CM | POA: Diagnosis present

## 2022-02-07 DIAGNOSIS — Z7902 Long term (current) use of antithrombotics/antiplatelets: Secondary | ICD-10-CM | POA: Diagnosis not present

## 2022-02-07 DIAGNOSIS — I129 Hypertensive chronic kidney disease with stage 1 through stage 4 chronic kidney disease, or unspecified chronic kidney disease: Secondary | ICD-10-CM | POA: Diagnosis present

## 2022-02-07 DIAGNOSIS — I2609 Other pulmonary embolism with acute cor pulmonale: Secondary | ICD-10-CM | POA: Diagnosis not present

## 2022-02-07 DIAGNOSIS — N184 Chronic kidney disease, stage 4 (severe): Secondary | ICD-10-CM | POA: Diagnosis present

## 2022-02-07 DIAGNOSIS — R609 Edema, unspecified: Secondary | ICD-10-CM

## 2022-02-07 DIAGNOSIS — E785 Hyperlipidemia, unspecified: Secondary | ICD-10-CM | POA: Diagnosis present

## 2022-02-07 DIAGNOSIS — Z79890 Hormone replacement therapy (postmenopausal): Secondary | ICD-10-CM | POA: Diagnosis not present

## 2022-02-07 DIAGNOSIS — I2489 Other forms of acute ischemic heart disease: Secondary | ICD-10-CM | POA: Diagnosis present

## 2022-02-07 DIAGNOSIS — J9601 Acute respiratory failure with hypoxia: Secondary | ICD-10-CM | POA: Diagnosis present

## 2022-02-07 DIAGNOSIS — I714 Abdominal aortic aneurysm, without rupture, unspecified: Secondary | ICD-10-CM | POA: Diagnosis not present

## 2022-02-07 DIAGNOSIS — Z7951 Long term (current) use of inhaled steroids: Secondary | ICD-10-CM | POA: Diagnosis not present

## 2022-02-07 DIAGNOSIS — Z951 Presence of aortocoronary bypass graft: Secondary | ICD-10-CM | POA: Diagnosis not present

## 2022-02-07 DIAGNOSIS — Z87891 Personal history of nicotine dependence: Secondary | ICD-10-CM | POA: Diagnosis not present

## 2022-02-07 DIAGNOSIS — I252 Old myocardial infarction: Secondary | ICD-10-CM | POA: Diagnosis not present

## 2022-02-07 DIAGNOSIS — D72829 Elevated white blood cell count, unspecified: Secondary | ICD-10-CM | POA: Diagnosis present

## 2022-02-07 DIAGNOSIS — I2693 Single subsegmental pulmonary embolism without acute cor pulmonale: Secondary | ICD-10-CM | POA: Diagnosis present

## 2022-02-07 DIAGNOSIS — Z7901 Long term (current) use of anticoagulants: Secondary | ICD-10-CM | POA: Diagnosis not present

## 2022-02-07 DIAGNOSIS — Z96642 Presence of left artificial hip joint: Secondary | ICD-10-CM | POA: Diagnosis present

## 2022-02-07 DIAGNOSIS — E039 Hypothyroidism, unspecified: Secondary | ICD-10-CM | POA: Diagnosis present

## 2022-02-07 DIAGNOSIS — I2699 Other pulmonary embolism without acute cor pulmonale: Secondary | ICD-10-CM | POA: Diagnosis present

## 2022-02-07 DIAGNOSIS — M353 Polymyalgia rheumatica: Secondary | ICD-10-CM | POA: Diagnosis present

## 2022-02-07 DIAGNOSIS — Z86718 Personal history of other venous thrombosis and embolism: Secondary | ICD-10-CM | POA: Diagnosis not present

## 2022-02-07 DIAGNOSIS — D649 Anemia, unspecified: Secondary | ICD-10-CM | POA: Diagnosis present

## 2022-02-07 DIAGNOSIS — Z20822 Contact with and (suspected) exposure to covid-19: Secondary | ICD-10-CM | POA: Diagnosis present

## 2022-02-07 LAB — CBC
HCT: 25.8 % — ABNORMAL LOW (ref 36.0–46.0)
Hemoglobin: 8.2 g/dL — ABNORMAL LOW (ref 12.0–15.0)
MCH: 31.8 pg (ref 26.0–34.0)
MCHC: 31.8 g/dL (ref 30.0–36.0)
MCV: 100 fL (ref 80.0–100.0)
Platelets: 385 K/uL (ref 150–400)
RBC: 2.58 MIL/uL — ABNORMAL LOW (ref 3.87–5.11)
RDW: 16.4 % — ABNORMAL HIGH (ref 11.5–15.5)
WBC: 14.2 K/uL — ABNORMAL HIGH (ref 4.0–10.5)
nRBC: 0.9 % — ABNORMAL HIGH (ref 0.0–0.2)

## 2022-02-07 LAB — COMPREHENSIVE METABOLIC PANEL
ALT: 29 U/L (ref 0–44)
AST: 48 U/L — ABNORMAL HIGH (ref 15–41)
Albumin: 2.1 g/dL — ABNORMAL LOW (ref 3.5–5.0)
Alkaline Phosphatase: 110 U/L (ref 38–126)
Anion gap: 13 (ref 5–15)
BUN: 30 mg/dL — ABNORMAL HIGH (ref 8–23)
CO2: 19 mmol/L — ABNORMAL LOW (ref 22–32)
Calcium: 7.9 mg/dL — ABNORMAL LOW (ref 8.9–10.3)
Chloride: 102 mmol/L (ref 98–111)
Creatinine, Ser: 1.9 mg/dL — ABNORMAL HIGH (ref 0.44–1.00)
GFR, Estimated: 26 mL/min — ABNORMAL LOW (ref >60)
Glucose, Bld: 89 mg/dL (ref 70–99)
Potassium: 3.7 mmol/L (ref 3.5–5.1)
Sodium: 134 mmol/L — ABNORMAL LOW (ref 135–145)
Total Bilirubin: 0.7 mg/dL (ref 0.3–1.2)
Total Protein: 5.2 g/dL — ABNORMAL LOW (ref 6.5–8.1)

## 2022-02-07 LAB — ECHOCARDIOGRAM COMPLETE
Area-P 1/2: 3.43 cm2
Height: 63 in
S' Lateral: 2.3 cm
Weight: 2539.7 oz

## 2022-02-07 LAB — HEPARIN LEVEL (UNFRACTIONATED)
Heparin Unfractionated: 0.65 IU/mL (ref 0.30–0.70)
Heparin Unfractionated: 0.57 IU/mL (ref 0.30–0.70)

## 2022-02-07 MED ORDER — APIXABAN 5 MG PO TABS
10.0000 mg | ORAL_TABLET | Freq: Two times a day (BID) | ORAL | Status: DC
  Administered 2022-02-07 – 2022-02-11 (×8): 10 mg via ORAL
  Filled 2022-02-07 (×7): qty 2

## 2022-02-07 MED ORDER — CEFADROXIL 500 MG PO CAPS
500.0000 mg | ORAL_CAPSULE | Freq: Two times a day (BID) | ORAL | Status: DC
  Administered 2022-02-07 – 2022-02-11 (×8): 500 mg via ORAL
  Filled 2022-02-07 (×10): qty 1

## 2022-02-07 MED ORDER — TAMSULOSIN HCL 0.4 MG PO CAPS
0.4000 mg | ORAL_CAPSULE | Freq: Every day | ORAL | Status: DC
  Administered 2022-02-07 – 2022-02-11 (×5): 0.4 mg via ORAL
  Filled 2022-02-07 (×5): qty 1

## 2022-02-07 MED ORDER — APIXABAN 5 MG PO TABS: 5.0000 mg | ORAL_TABLET | Freq: Two times a day (BID) | ORAL | Status: DC

## 2022-02-07 MED ORDER — APIXABAN 5 MG PO TABS
10.0000 mg | ORAL_TABLET | Freq: Two times a day (BID) | ORAL | Status: DC
  Filled 2022-02-07: qty 2

## 2022-02-07 NOTE — Progress Notes (Signed)
ANTICOAGULATION CONSULT NOTE   Pharmacy Consult for heparin Indication: pulmonary embolus  Allergies  Allergen Reactions   Nsaids Other (See Comments)    Pt states all NSAIDS does not sit well onstomach   Aspirin Other (See Comments)    Hurts stomach   Codeine Nausea And Vomiting   Nitroglycerin Other (See Comments)    Heart rate drops    Patient Measurements: Height: '5\' 3"'$  (160 cm) Weight: 72 kg (158 lb 11.7 oz) IBW/kg (Calculated) : 52.4 Heparin Dosing Weight: 67.5kg  Vital Signs: Temp: 98.7 F (37.1 C) (10/10 1545) Temp Source: Oral (10/10 1545) BP: 119/72 (10/10 1545) Pulse Rate: 82 (10/10 1545)  Labs: Recent Labs    02/06/22 1529 02/06/22 2031 02/07/22 0315 02/07/22 0659 02/07/22 1543  HGB 9.6*  --  8.2*  --   --   HCT 29.8*  --  25.8*  --   --   PLT 422*  --  385  --   --   HEPARINUNFRC  --   --   --  0.65 0.57  CREATININE 1.89*  --  1.90*  --   --   TROPONINIHS 47* 42*  --   --   --      Estimated Creatinine Clearance: 21.3 mL/min (A) (by C-G formula based on SCr of 1.9 mg/dL (H)).   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 Most likely scar. Mild subendocardial ischemia. EF 71% LOW RISK.    Cervical disc disease    fracture   Chronic kidney disease    COPD (chronic obstructive pulmonary disease) (HCC)    Diverticulosis     Hyperlipidemia    Hypertension    Hypothyroidism (acquired)    hypo   Myocardial infarction ([place]) 11/2014   TIA   Polymyalgia rheumatica ([place])    2011 Dr. Charlestine Night   Rheumatoid arthritis [facility]) 2011   Dr.Truslow; fracture knees, hands and wrists -    S/P CABG x 1 1996   CABG--LIMA-LAD for ostial LAD (not felt to be PCI amenable). EF NORMAL then; LIMA now atretic   Shortness of breath dyspnea    with exertion   Stroke ([place]) 11/2014   TIA    Urinary frequency     Assessment: 80 YOF presenting with somnolence, Ct angio chest with PE, has been on lovenox 40 mg SQ q 24h PTA (last dose 10/9) s/p hip surgery.    Heparin level this afternoon remains in goal range. Hemoglobin with a downward trend, no overt bleeding or complications noted.   Goal of Therapy:  Heparin level 0.3-0.7 units/ml Monitor platelets by anticoagulation protocol: Yes   Plan:  Continue heparin at 1150 units/hr.  Daily heparin level and CBC. F/u long term [facility] plan  Marguerite Olea, [facility] Clinical Pharmacist  02/07/2022 4:57 PM   [facility] pharmacy phone numbers are listed on [email or web address]

## 2022-02-07 NOTE — Progress Notes (Signed)
PROGRESS NOTE  [name]  JJH:[id number] DOB: 11-12-1940 DOA: 02/06/2022 PCP: Cassandria Anger, MD   Brief Narrative:  Patient is 81 year old female with history of hypertension, rheumatoid arthritis, macular degeneration, status post splenectomy, mesenteric artery stenosis, coronary artery disease status post CABG, CKD stage IV, hypothyroidism, TIA, COPD, DVT, GERD who [facility] with complaint of increased drowsiness, fatigue, shortness of breath.  She was recently placed at rehab after left total hip replacement on 9/26,was on lovenox for dvt ppx. She was also found to have DVT of RLE recently, after  she had swelling of the left leg ,but not sure she was started on anticoagulation. On presentation,she was hemodynamically stable but requiring 4 L of oxygen to maintain saturation.  Lab work showed creatinine of 1.8, WBC of 14.5, hemoglobin of 9.2, BNP of 653, magnesium of 1.3..  Chest x-ray showed small bilateral pleural effusion, basilar atelectasis.  CT head did not show any acute abnormality.  CT angiogram showed isolated subsegmental PE in the right middle lobe.  Patient was admitted for the management of acute PE, started on heparin drip.  Pending echocardiogram.  Plan is to convert to Eliquis.  Also consulting PT/OT  Assessment & Plan:  Principal Problem:   Acute pulmonary embolism (HCC) Active Problems:   CKD (chronic kidney disease) stage 4, GFR 15-29 ml/min (HCC)   Essential hypertension   Rheumatoid arthritis (HCC)   Hypothyroidism   Macular degeneration (senile) of retina   ASPLENIA   Mesenteric artery stenosis (HCC)   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)   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; now atretic and significantly improved LAD lesion without intervention   Dyslipidemia, goal LDL below 70   COPD GOLD I   GERD (gastroesophageal reflux disease)   Acute PE: Provoked PE, H/O  total left  hip  replacement.  Also found to have age indeterminate right lower extremity DVT on 9/30 but unsure her anticoagulation was started.  She was on Lovenox on discharge from here. Hypoxic on presentation requiring 4 L.  CT angiogram showed isolated subsegmental PE in the right middle lobe.  Patient was admitted for the management of acute PE, started on heparin drip.  Echocardiogram pending.  Plan is to transition heparin to Eliquis. Her left lower extremity appears edematous, pending venous Doppler  Acute hypoxic respiratory failure: This is most likely from PE.  Not on oxygen at baseline.  We will wean.  Lungs are clear on auscultation  Patient left total hip replacement: PT/OT will be consulted.  Currently residing at rehab of her independent living facility.  She is still has sutures on the left hip.  We can consider removing the suture before discharge  Leukocytosis: This is most likely active.  She was given a dose of vancomycin and cefepime.  UA showed only leukocytes, rare bacteria.  Chest imaging negative for pneumonia.  Continue to monitor CBC.  Afebrile.  Denies dysuria.  Hold antibiotics for now, follow-up culture  CKD stage IV: Baseline creatinine around 1.7-2 .currently kidney function at baseline.  Got contrast for CT angiogram.  Monitor  Normocytic anemia: Hemoglobin at 9.  Monitor  Hypertension: Continue amlodipine, hydrochlorothiazide, metoprolol  Arthritis: On golimumab, leflunomide  Coronary artery disease: Status post CABG.  On metoprolol, atorvastatin.  Denies any chest pain.  Mild elevated troponin with flat trend most likely secondary to supply demand ischemia from PE.  Hypothyroidism: Continue Synthyroid  History of TIA: On  Lipitor  COPD: Currently not in exacerbation.  Continue bronchodilators  GERD: Continue PPI  Status post abdominal aortic aneurysm repair/mesenteric artery stenosis: On Lipitor, Plavix  Urinary retention: Patient was discharged with Foley catheter  during last admission and was recommended to follow-up with urology as an outpatient.  Continue Foley catheter here and recommend outpatient follow-up with urology.Added Flomax         DVT prophylaxis:  IV heparin     Code Status: Full Code  Family Communication: Husband on phone on 10/10  Patient status: Inpatient  Patient is from : Home  Anticipated discharge to: Home versus SNF  Estimated DC date: 1 to 2 days   Consultants: None  Procedures: None  Antimicrobials:  Anti-infectives (From admission, onward)    Start     Dose/Rate Route Frequency Ordered Stop   02/07/22 2200  ceFEPIme (MAXIPIME) 2 g in sodium chloride 0.9 % 100 mL IVPB  Status:  Discontinued        2 g 200 mL/hr over 30 Minutes Intravenous Every 24 hours 02/06/22 2007 02/06/22 2320   02/06/22 1915  vancomycin (VANCOCIN) IVPB 1000 mg/200 mL premix        1,000 mg 200 mL/hr over 60 Minutes Intravenous  Once 02/06/22 1905 02/06/22 2255   02/06/22 1915  ceFEPIme (MAXIPIME) 2 g in sodium chloride 0.9 % 100 mL IVPB        2 g 200 mL/hr over 30 Minutes Intravenous  Once 02/06/22 1908 02/06/22 2150       Subjective:  Patient seen and examined the bedside today.  She was on 3 to 4 L of oxygen per minute.  She denies any shortness of breath.  She was not in any respiratory distress and declined any chest pain.  Looks very deconditioned and weak though.  Objective: Vitals:   02/07/22 0539 02/07/22 0630 02/07/22 0632 02/07/22 0735  BP:  (!) 157/51  (!) 150/47  Pulse: 94 92  90  Resp: '14 20  15  '$ Temp:   (!) 97.2 F (36.2 C) 97.8 F (36.6 C)  TempSrc:   Oral Oral  SpO2: 96% 98%  97%  Weight:      Height:        Intake/Output Summary (Last 24 hours) at 02/07/2022 0803 Last data filed at 02/07/2022 0240 Gross per 24 hour  Intake 1181.09 ml  Output 900 ml  Net 281.09 ml   Filed Weights   02/06/22 1719  Weight: 72 kg    Examination:  General exam: Overall comfortable, not in distress,  pleasant elderly female HEENT: PERRL Respiratory system:  no wheezes or crackles  Cardiovascular system: S1 & S2 heard, RRR.  Gastrointestinal system: Abdomen is nondistended, soft and nontender. Central nervous system: Alert and oriented Extremities: Left lower extremity edema, no clubbing ,no cyanosis, sutures on the left hip Skin: No rashes, no ulcers,no icterus   Foley   Data Reviewed: I have personally reviewed following labs and imaging studies  CBC: Recent Labs  Lab 02/06/22 1529 02/07/22 0315  WBC 14.5* 14.2*  HGB 9.6* 8.2*  HCT 29.8* 25.8*  MCV 101.4* 100.0  PLT 422* 829   Basic Metabolic Panel: Recent Labs  Lab 02/06/22 1529 02/07/22 0315  NA 133* 134*  K 4.3 3.7  CL 98 102  CO2 20* 19*  GLUCOSE 122* 89  BUN 33* 30*  CREATININE 1.89* 1.90*  CALCIUM 8.2* 7.9*  MG 1.3*  --      Recent Results (from the past 240  hour(s))  SARS Coronavirus 2 by RT PCR (hospital order, performed in [facility] hospital lab) *cepheid single result test* Anterior Nasal Swab     Status: None   Collection Time: 02/06/22  6:18 PM   Specimen: Anterior Nasal Swab  Result Value Ref Range Status   SARS Coronavirus 2 by RT PCR NEGATIVE NEGATIVE Final    Comment: (NOTE) SARS-CoV-2 target nucleic acids are NOT DETECTED.  The SARS-CoV-2 RNA is generally detectable in upper and lower respiratory specimens during the acute phase of infection. The lowest concentration of SARS-CoV-2 viral copies this assay can detect is 250 copies / mL. A negative result does not preclude SARS-CoV-2 infection and should not be used as the sole basis for treatment or other patient management decisions.  A negative result may occur with improper specimen collection / handling, submission of specimen other than nasopharyngeal swab, presence of viral mutation(s) within the areas targeted by this assay, and inadequate number of viral copies (<250 copies / mL). A negative result must be combined with  clinical observations, patient history, and epidemiological information.  Fact Sheet for Patients:   https://www.patel.info/  Fact Sheet for Healthcare Providers: https://hall.com/  This test is not yet approved or  cleared by the [place] FDA and has been authorized for detection and/or diagnosis of SARS-CoV-2 by FDA under an Emergency Use Authorization (EUA).  This EUA will remain in effect (meaning this test can be used) for the duration of the COVID-19 declaration under Section 564(b)(1) of the Act, 21 U.S.C. section 360bbb-3(b)(1), unless the authorization is terminated or revoked sooner.  Performed at [facility], [place] [address] [place], [place] [address]      Radiology Studies: CT Angio Chest PE W and/or Wo Contrast  Result Date: 02/06/2022 CLINICAL DATA:  Provided history: Pulmonary embolism (PE) suspected, high prob reduce dose contrast Dural venous and constipation. Recent hip replacement. EXAM: CT ANGIOGRAPHY CHEST WITH CONTRAST TECHNIQUE: Multidetector CT imaging of the chest was performed using the standard protocol during bolus administration of intravenous contrast. Multiplanar CT image reconstructions and MIPs were obtained to evaluate the vascular anatomy. RADIATION DOSE REDUCTION: This exam was performed according to the departmental dose-optimization program which includes automated exposure control, adjustment of the mA and/or kV according to patient size and/or use of iterative reconstruction technique. CONTRAST:  80 cc Omnipaque 350 IV COMPARISON:  Radiograph earlier today. Chest CT 03/17/2021. FINDINGS: Cardiovascular: Isolated subsegmental embolus in the medial right middle lobe pulmonary artery, for example series 9, image 171 and series 10, image 58. No additional pulmonary emboli. Calcified and noncalcified atheromatous plaque throughout the thoracic aorta. No aneurysm or dissection. Post CABG. Upper normal  heart size. No pericardial effusion. Mediastinum/Nodes: No enlarged mediastinal or hilar lymph nodes. No visible thyroid nodule. Tiny hiatal hernia. Lungs/Pleura: Small right and trace left pleural effusion. Associated atelectasis. The previous subpleural right lower lobe nodule is obscured on the current exam due to pleural fluid and atelectasis. Postsurgical change in the left upper lobe. There is mild bronchial thickening in the lower lobes. Upper Abdomen: No acute upper abdominal findings. Elongated left lobe of the liver. Musculoskeletal: Stable punctate sclerotic focus within T4 vertebral body likely a bone island. Median sternotomy. No acute osseous findings. Review of the MIP images confirms the above findings. IMPRESSION: 1. Isolated subsegmental embolus in the medial right middle lobe pulmonary artery. 2. Small right and trace left pleural effusion. Associated atelectasis. 3. Mild bronchial thickening in the lower lobes. Aortic Atherosclerosis (ICD10-I70.0). Critical  Value/emergent results were called by telephone at the time of interpretation on 02/06/2022 at 10:25 pm to provider DAVID YAO , who verbally acknowledged these results. Electronically Signed   By: Keith Rake M.D.   On: 02/06/2022 22:26   CT Head Wo Contrast  Result Date: 02/06/2022 CLINICAL DATA:  Mental status change, unknown cause EXAM: CT HEAD WITHOUT CONTRAST TECHNIQUE: Contiguous axial images were obtained from the base of the skull through the vertex without intravenous contrast. RADIATION DOSE REDUCTION: This exam was performed according to the departmental dose-optimization program which includes automated exposure control, adjustment of the mA and/or kV according to patient size and/or use of iterative reconstruction technique. COMPARISON:  02/15/2020 FINDINGS: Brain: No evidence of acute infarction, hemorrhage, mass, mass effect, or midline shift. No hydrocephalus or extra-axial fluid collection. Mildly advanced cerebral  atrophy for age. Vascular: No hyperdense vessel. Skull: Normal. Negative for fracture or focal lesion. Sinuses/Orbits: No acute finding. Status post bilateral lens replacements. Other: The mastoid air cells are well aerated. IMPRESSION: No acute intracranial process. Electronically Signed   By: Merilyn Baba M.D.   On: 02/06/2022 14:50   DG Chest 2 View  Result Date: 02/06/2022 CLINICAL DATA:  Cough EXAM: CHEST - 2 VIEW COMPARISON:  Chest x-ray dated Sep 26, 2019 FINDINGS: Cardiac and mediastinal contours are within normal limits. Prior median sternotomy and CABG. Small bilateral pleural effusions and bibasilar atelectasis. No evidence of pneumothorax. IMPRESSION: Small bilateral pleural effusions and bibasilar atelectasis. Electronically Signed   By: Yetta Glassman M.D.   On: 02/06/2022 14:49    Scheduled Meds:  amLODipine  2.5 mg Oral Daily   arformoterol  15 mcg Nebulization BID   And   umeclidinium bromide  1 puff Inhalation Daily   atorvastatin  80 mg Oral Daily   famotidine  20 mg Oral Daily   gabapentin  300 mg Oral TID   Golimumab   Oral Daily   hydrochlorothiazide  12.5 mg Oral Daily   leflunomide  20 mg Oral Daily   levothyroxine  88 mcg Oral QAC breakfast   metoprolol tartrate  50 mg Oral BID   pantoprazole  40 mg Oral Daily   sodium chloride flush  3 mL Intravenous Q12H   Continuous Infusions:  heparin 1,150 Units/hr (02/07/22 0240)     LOS: 0 days   Shelly Coss, MD [facility] P10/01/2022, 8:03 AM

## 2022-02-07 NOTE — ED Notes (Signed)
ED TO INPATIENT HANDOFF REPORT  ED Nurse Name and Phone #: [name]  S Name/Age/Gender [name] 81 y.o. female Room/Bed: 046C/046C  Code Status   Code Status: Full Code  Home/SNF/Other Rehab Patient oriented to: self, place, time, and situation Is this baseline? Yes   Triage Complete: Triage complete  Chief Complaint Acute pulmonary embolism [facility]) [I26.99]  Triage Note Patient BIB [facility] from [facility] where she is staying after a hip replacement, EMS was called by patient's husband for evaluation of increased drowsiness. Patient's only complains of constipation for the last few days.  Patient is alert, oriented, and in no apparent distress at this time.  EMS Vitals 92% on room air    Allergies Allergies  Allergen Reactions   Nsaids Other (See Comments)    Pt states all NSAIDS does not sit well onstomach   Aspirin Other (See Comments)    Hurts stomach   Codeine Nausea And Vomiting   Nitroglycerin Other (See Comments)    Heart rate drops    Level of Care/Admitting Diagnosis ED Disposition     ED Disposition  Admit   Condition  --   Comment  Hospital Area: [facility] [[id number]]  Level of Care: Telemetry Cardiac [[id number]]  May admit patient to [facility] or [facility] if equivalent level of care is available:: No  Covid Evaluation: Confirmed COVID Negative  Diagnosis: Acute pulmonary embolism [facility]) [[id number]]  Admitting Physician: Marcelyn Bruins [[id number]]  Attending Physician: Marcelyn Bruins [[id number]]  Certification:: I certify this patient will need inpatient services for at least 2 midnights  Estimated Length of Stay: 2          B Medical/Surgery History Past Medical History:  Diagnosis Date   Abdominal aortic aneurysm ([place])    REPAIRED IN 1996 BY DR Goree [name]   Acquired asplenia     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 Most likely scar. Mild subendocardial ischemia. EF 71% LOW RISK.    Cervical disc disease    fracture   Chronic kidney disease    COPD (chronic obstructive pulmonary disease) (HCC)    Diverticulosis    Hyperlipidemia    Hypertension    Hypothyroidism (acquired)    hypo   Myocardial infarction ([place]) 11/2014   TIA   Polymyalgia rheumatica ([place])    2011 Dr. Charlestine Night   Rheumatoid arthritis [facility]) 2011   Dr.Truslow; fracture knees, hands and wrists -    S/P CABG x 1 1996   CABG--LIMA-LAD for ostial LAD (not felt to be PCI amenable). 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 Atretic LIMA. Minimal disease the RCA and Circumflex system.   CARPAL TUNNEL RELEASE Left    CATARACT EXTRACTION Bilateral    CERVICAL SPINE SURGERY     plate 2008 Dr. Saintclair Halsted   COLONOSCOPY     CORONARY ARTERY BYPASS GRAFT  1996   INCLUDED AN INTERNAL MAMMARY ARTERY TO THE LAD. EF WAS NORMAL   INGUINAL HERNIA REPAIR Right    LAPAROSCOPIC APPENDECTOMY N/A 06/28/2016   Procedure: APPENDECTOMY LAPAROSCOPIC;  Surgeon: Leighton Ruff, MD;  Location: [facility];  Service: General;  Laterality: N/A;    NM MYOVIEW LTD  06/2010   Fixed anteroseptal, apical and inferoapical defect with moderate size. Most likely scar. Mild subendocardial ischemia. EF 71% LOW RISK.    SPLENECTOMY     TOTAL HIP ARTHROPLASTY Left 01/23/2022   Procedure: LEFT TOTAL HIP ARTHROPLASTY ANTERIOR APPROACH;  Surgeon: Leandrew Koyanagi, MD;  Location: [place];  Service: Orthopedics;  Laterality: Left;  3-C   TRANSTHORACIC ECHOCARDIOGRAM  12/2014   [facility]: Normal LV size & function. EF 55-60%,    vagina polyp     VIDEO ASSISTED THORACOSCOPY (VATS)/WEDGE RESECTION Left 03/02/2015   Procedure: VIDEO ASSISTED THORACOSCOPY (VATS), MINI THORACOTOMY, LEFT UPPER LOBE WEDGE, TAKE DOWN OF INTERNAL MAMMARY LESIONS, PLACEMENT OF ON-Q PUMP;  Surgeon:  Isaac, MD;  Location: [place];  Service: Thoracic;  Laterality: Left;   VIDEO BRONCHOSCOPY N/A 03/02/2015   Procedure: BRONCHOSCOPY;  Surgeon:  Isaac, MD;  Location: [place];  Service: Thoracic;  Laterality: N/A;   VIDEO BRONCHOSCOPY WITH ENDOBRONCHIAL NAVIGATION N/A 10/08/2017   Procedure: VIDEO BRONCHOSCOPY WITH ENDOBRONCHIAL NAVIGATION WITH BIOPSIES OF LEFT UPPER LOBE AND LEFT LOWER LOBE;  Surgeon:  Isaac, MD;  Location: [place];  Service: Thoracic;  Laterality: N/A;     A IV Location/Drains/Wounds Patient Lines/Drains/Airways Status     Active Line/Drains/Airways     Name Placement date Placement time Site Days   Peripheral IV 02/06/22 22 G Posterior;Right Hand 02/06/22  1844  Hand  1   Peripheral IV 02/06/22 20 G Left Antecubital 02/06/22  2022  Antecubital  1   Urethral Catheter [name] LPN Non-latex 14 Fr. 01/30/22  0924  Non-latex  8   Incision (Closed) 01/23/22 Hip 01/23/22  0905  -- 15            Intake/Output Last 24 hours  Intake/Output Summary (Last 24 hours) at 02/07/2022 1732 Last data filed at 02/07/2022 1725 Gross per 24 hour  Intake 1181.09 ml  Output 1900 ml  Net -718.91 ml    Labs/Imaging Results for  orders placed or performed during the hospital encounter of 02/06/22 (from the past 48 hour(s))  Comprehensive metabolic panel     Status: Abnormal   Collection Time: 02/06/22  3:29 PM  Result Value Ref Range   Sodium 133 (L) 135 - 145 mmol/L   Potassium 4.3 3.5 - 5.1 mmol/L   Chloride 98 98 - 111 mmol/L   CO2 20 (L) 22 - 32 mmol/L   Glucose, Bld 122 (H) 70 - 99 mg/dL    Comment: Glucose reference range applies only to samples taken after fasting for at least 8 hours.   BUN 33 (H) 8 - 23 mg/dL   Creatinine, Ser 1.89 (H) 0.44 - 1.00 mg/dL   Calcium 8.2 (L) 8.9 -  10.3 mg/dL   Total Protein 6.5 6.5 - 8.1 g/dL   Albumin 2.4 (L) 3.5 - 5.0 g/dL   AST 63 (H) 15 - 41 U/L   ALT 36 0 - 44 U/L   Alkaline Phosphatase 123 38 - 126 U/L   Total Bilirubin 0.5 0.3 - 1.2 mg/dL   GFR, Estimated 26 (L) >60 mL/min    Comment: (NOTE) Calculated using the CKD-EPI Creatinine Equation (2021)    Anion gap 15 5 - 15    Comment: Performed at [facility] [address], [place], [place] [address]  CBC     Status: Abnormal   Collection Time: 02/06/22  3:29 PM  Result Value Ref Range   WBC 14.5 (H) 4.0 - 10.5 K/uL   RBC 2.94 (L) 3.87 - 5.11 MIL/uL   Hemoglobin 9.6 (L) 12.0 - 15.0 g/dL   HCT 29.8 (L) 36.0 - 46.0 %   MCV 101.4 (H) 80.0 - 100.0 fL   MCH 32.7 26.0 - 34.0 pg   MCHC 32.2 30.0 - 36.0 g/dL   RDW 16.6 (H) 11.5 - 15.5 %   Platelets 422 (H) 150 - 400 K/uL   nRBC 1.2 (H) 0.0 - 0.2 %    Comment: Performed at [facility], [place] [address] [place], [place] [address]  Troponin I (High Sensitivity)     Status: Abnormal   Collection Time: 02/06/22  3:29 PM  Result Value Ref Range   Troponin I (High Sensitivity) 47 (H) <18 ng/L    Comment: (NOTE) Elevated high sensitivity troponin I (hsTnI) values and significant  changes across serial measurements may suggest ACS but many other  chronic and acute conditions are known to elevate hsTnI results.  Refer to the "Links" section for chest pain  algorithms and additional  guidance. Performed at [facility], [place] [address], [place], [place] [address]   Brain natriuretic peptide     Status: Abnormal   Collection Time: 02/06/22  3:29 PM  Result Value Ref Range   B Natriuretic Peptide 653.8 (H) 0.0 - 100.0 pg/mL    Comment: Performed at [facility] [address], [place], [place] [address]  Magnesium     Status: Abnormal   Collection Time: 02/06/22  3:29 PM  Result Value Ref Range   Magnesium 1.3 (L) 1.7 - 2.4 mg/dL    Comment: Performed at [facility] [address] [place], [place] [address]  Urinalysis, Routine w reflex microscopic Urine, Catheterized     Status: Abnormal   Collection Time: 02/06/22  6:00 PM  Result Value Ref Range   Color, Urine AMBER (A) YELLOW    Comment: BIOCHEMICALS MAY BE AFFECTED BY COLOR   APPearance CLOUDY (A) CLEAR   Specific Gravity, Urine 1.014 1.005 - 1.030   pH 5.0 5.0 - 8.0   Glucose, UA NEGATIVE NEGATIVE mg/dL   Hgb urine dipstick NEGATIVE NEGATIVE   Bilirubin Urine NEGATIVE NEGATIVE   Ketones, ur NEGATIVE NEGATIVE mg/dL   Protein, ur 100 (A) NEGATIVE mg/dL   Nitrite NEGATIVE NEGATIVE   Leukocytes,Ua MODERATE (A) NEGATIVE   RBC / HPF 11-20 0 - 5 RBC/hpf   WBC, UA 21-50 0 - 5 WBC/hpf   Bacteria, UA RARE (A) NONE SEEN   Squamous Epithelial / LPF 0-5 0 - 5   WBC Clumps PRESENT    Mucus PRESENT    Budding Yeast PRESENT    Hyaline Casts, UA PRESENT  Comment: Performed at [facility], [place] [address] [place], [place] [address]  SARS Coronavirus 2 by RT PCR (hospital order, performed in [facility] hospital lab) *cepheid single result test* Anterior Nasal Swab     Status: None   Collection Time: 02/06/22  6:18 PM   Specimen: Anterior Nasal Swab  Result Value Ref Range   SARS Coronavirus 2 by RT PCR NEGATIVE NEGATIVE    Comment: (NOTE) SARS-CoV-2 target nucleic acids are NOT DETECTED.  The SARS-CoV-2 RNA is generally detectable in upper and  lower respiratory specimens during the acute phase of infection. The lowest concentration of SARS-CoV-2 viral copies this assay can detect is 250 copies / mL. A negative result does not preclude SARS-CoV-2 infection and should not be used as the sole basis for treatment or other patient management decisions.  A negative result may occur with improper specimen collection / handling, submission of specimen other than nasopharyngeal swab, presence of viral mutation(s) within the areas targeted by this assay, and inadequate number of viral copies (<250 copies / mL). A negative result must be combined with clinical observations, patient history, and epidemiological information.  Fact Sheet for Patients:   https://www.patel.info/  Fact Sheet for Healthcare Providers: https://hall.com/  This test is not yet approved or  cleared by the [place] FDA and has been authorized for detection and/or diagnosis of SARS-CoV-2 by FDA under an Emergency Use Authorization (EUA).  This EUA will remain in effect (meaning this test can be used) for the duration of the COVID-19 declaration under Section 564(b)(1) of the Act, 21 U.S.C. section 360bbb-3(b)(1), unless the authorization is terminated or revoked sooner.  Performed at [facility], [place] [address] [place], [place] [address]   Troponin I (High Sensitivity)     Status: Abnormal   Collection Time: 02/06/22  8:31 PM  Result Value Ref Range   Troponin I (High Sensitivity) 42 (H) <18 ng/L    Comment: (NOTE) Elevated high sensitivity troponin I (hsTnI) values and significant  changes across serial measurements may suggest ACS but many other  chronic and acute conditions are known to elevate hsTnI results.  Refer to the "Links" section for chest pain algorithms and additional  guidance. Performed at [facility], [place] [address] [place], [place] [address]   CBC     Status: Abnormal    Collection Time: 02/07/22  3:15 AM  Result Value Ref Range   WBC 14.2 (H) 4.0 - 10.5 K/uL   RBC 2.58 (L) 3.87 - 5.11 MIL/uL   Hemoglobin 8.2 (L) 12.0 - 15.0 g/dL   HCT 25.8 (L) 36.0 - 46.0 %   MCV 100.0 80.0 - 100.0 fL   MCH 31.8 26.0 - 34.0 pg   MCHC 31.8 30.0 - 36.0 g/dL   RDW 16.4 (H) 11.5 - 15.5 %   Platelets 385 150 - 400 K/uL   nRBC 0.9 (H) 0.0 - 0.2 %    Comment: Performed at [facility], [place] [address] [place], [place] [address]  Comprehensive metabolic panel     Status: Abnormal   Collection Time: 02/07/22  3:15 AM  Result Value Ref Range   Sodium 134 (L) 135 - 145 mmol/L   Potassium 3.7 3.5 - 5.1 mmol/L   Chloride 102 98 - 111 mmol/L   CO2 19 (L) 22 - 32 mmol/L   Glucose, Bld 89 70 - 99 mg/dL    Comment: Glucose reference range applies only to samples taken after  fasting for at least 8 hours.   BUN 30 (H) 8 - 23 mg/dL   Creatinine, Ser 1.90 (H) 0.44 - 1.00 mg/dL   Calcium 7.9 (L) 8.9 - 10.3 mg/dL   Total Protein 5.2 (L) 6.5 - 8.1 g/dL   Albumin 2.1 (L) 3.5 - 5.0 g/dL   AST 48 (H) 15 - 41 U/L   ALT 29 0 - 44 U/L   Alkaline Phosphatase 110 38 - 126 U/L   Total Bilirubin 0.7 0.3 - 1.2 mg/dL   GFR, Estimated 26 (L) >60 mL/min    Comment: (NOTE) Calculated using the CKD-EPI Creatinine Equation (2021)    Anion gap 13 5 - 15    Comment: Performed at [facility], [place] [address] [place], [place] [address]  Heparin level (unfractionated)     Status: None   Collection Time: 02/07/22  6:59 AM  Result Value Ref Range   Heparin Unfractionated 0.65 0.30 - 0.70 IU/mL    Comment: (NOTE) The clinical reportable range upper limit is being lowered to >1.10 to align with the FDA approved guidance for the current laboratory assay.  If heparin results are below expected values, and patient dosage has  been confirmed, suggest follow up testing of antithrombin III levels. Performed at [facility], [place] [address], [place], [place] [address]   Heparin level  (unfractionated)     Status: None   Collection Time: 02/07/22  3:43 PM  Result Value Ref Range   Heparin Unfractionated 0.57 0.30 - 0.70 IU/mL    Comment: (NOTE) The clinical reportable range upper limit is being lowered to >1.10 to align with the FDA approved guidance for the current laboratory assay.  If heparin results are below expected values, and patient dosage has  been confirmed, suggest follow up testing of antithrombin III levels. Performed at [facility], [place] [address] [place], [place] [address]    VAS [place] LOWER EXTREMITY VENOUS (DVT)  Result Date: 02/07/2022  Lower Venous DVT Study Patient Name:  [name]  Date of Exam:   02/07/2022 Medical Rec #: [id number]       Accession #:    [id number] Date of Birth: 1941/03/05        Patient Gender: F Patient Age:   8 years Exam Location:  [facility] Procedure:      VAS [place] LOWER EXTREMITY VENOUS (DVT) Referring Phys: Sheppard Coil MELVIN --------------------------------------------------------------------------------  Indications: Edema.  Limitations: Pain tolerance. Comparison Study: 01/28/22 prior Performing Technologist: Archie Patten RVS  Examination Guidelines: A complete evaluation includes B-mode imaging, spectral Doppler, color Doppler, and power Doppler as needed of all accessible portions of each vessel. Bilateral testing is considered an integral part of a complete examination. Limited examinations for reoccurring indications may be performed as noted. The reflux portion of the exam is performed with the patient in reverse Trendelenburg.  +---------+---------------+---------+-----------+----------+-------------------+ RIGHT    CompressibilityPhasicitySpontaneityPropertiesThrombus Aging      +---------+---------------+---------+-----------+----------+-------------------+ CFV      Full           Yes      Yes                                       +---------+---------------+---------+-----------+----------+-------------------+ SFJ      Full                                                             +---------+---------------+---------+-----------+----------+-------------------+  FV Prox  Full                                                             +---------+---------------+---------+-----------+----------+-------------------+ FV Mid                  Yes      Yes                  unable to tolerate                                                        compression due to                                                        pain tolerance-                                                           patent by color                                                           doppler             +---------+---------------+---------+-----------+----------+-------------------+ FV Distal               Yes      Yes                  unable to tolerate                                                        compression due to                                                        pain tolerance-                                                           patent by color  doppler             +---------+---------------+---------+-----------+----------+-------------------+ PFV      Full                                                             +---------+---------------+---------+-----------+----------+-------------------+ POP      Full           Yes      Yes                                      +---------+---------------+---------+-----------+----------+-------------------+ PTV      Full                                                             +---------+---------------+---------+-----------+----------+-------------------+ PERO     Full           Yes      Yes                  patent by color                                                            doppler             +---------+---------------+---------+-----------+----------+-------------------+  +---------+---------------+---------+-----------+----------+-------------------+ LEFT     CompressibilityPhasicitySpontaneityPropertiesThrombus Aging      +---------+---------------+---------+-----------+----------+-------------------+ CFV      Full           Yes      Yes                                      +---------+---------------+---------+-----------+----------+-------------------+ SFJ      Full                                                             +---------+---------------+---------+-----------+----------+-------------------+ FV Prox  Full                                                             +---------+---------------+---------+-----------+----------+-------------------+ FV Mid                  Yes      Yes                  unable to tolerate  compression due to                                                        pain tolerance-                                                           patent by color                                                           doppler             +---------+---------------+---------+-----------+----------+-------------------+ FV Distal               Yes      Yes                  unable to tolerate                                                        compression due to                                                        pain tolerance-                                                           patent by color                                                           doppler             +---------+---------------+---------+-----------+----------+-------------------+ PFV      Full                                                              +---------+---------------+---------+-----------+----------+-------------------+ POP      Full           Yes      Yes                                      +---------+---------------+---------+-----------+----------+-------------------+  PTV      Full                                                             +---------+---------------+---------+-----------+----------+-------------------+ PERO     Full                                                             +---------+---------------+---------+-----------+----------+-------------------+    Summary: RIGHT: - There is no evidence of deep vein thrombosis in the lower extremity.  - A cystic structure is found in the popliteal fossa.  LEFT: - There is no evidence of deep vein thrombosis in the lower extremity.  - No cystic structure found in the popliteal fossa.  *See table(s) above for measurements and observations.    Preliminary    ECHOCARDIOGRAM COMPLETE  Result Date: 02/07/2022    ECHOCARDIOGRAM REPORT   Patient Name:   [name] Date of Exam: 02/07/2022 Medical Rec #:  [id number]      Height:       63.0 in Accession #:    [id number]     Weight:       158.7 lb Date of Birth:  07/12/1940       BSA:          1.753 m Patient Age:    24 years       BP:           120/41 mmHg Patient Gender: F              HR:           79 bpm. Exam Location:  Inpatient Procedure: 2D Echo, 3D Echo, Cardiac Doppler and Color Doppler Indications:    Pulmonary Embolus I26.09  History:        Patient has prior history of Echocardiogram examinations, most                 recent 03/10/2019. CAD, Prior CABG, COPD and TIA; Risk                 Factors:Hypertension. History of AAA repair, GERD, GERD, Chronic                 kidney disease. Mesenteric artery stenosis.  Sonographer:    Darlina Sicilian RDCS Referring Phys: [id number] [place]  1. Left ventricular ejection fraction, by estimation, is 65 to 70%. Left ventricular ejection fraction by 3D  volume is 68 %. The left ventricle has normal function. The left ventricle has no regional wall motion abnormalities. There is severe asymmetric  left ventricular hypertrophy. Left ventricular diastolic parameters are consistent with Grade II diastolic dysfunction (pseudonormalization).  2. Right ventricular systolic function is normal. The right ventricular size is normal. There is normal pulmonary artery systolic pressure.  3. The mitral valve is normal in structure. Trivial mitral valve regurgitation. No evidence of mitral stenosis.  4. The aortic valve is tricuspid. There is mild thickening of the aortic valve. Aortic valve regurgitation is not visualized. Comparison(s): Prior images reviewed side by side. MR has improved. FINDINGS  Left Ventricle: Left  ventricular ejection fraction, by estimation, is 65 to 70%. Left ventricular ejection fraction by 3D volume is 68 %. The left ventricle has normal function. The left ventricle has no regional wall motion abnormalities. The left ventricular internal cavity size was small. There is severe asymmetric left ventricular hypertrophy. Left ventricular diastolic parameters are consistent with Grade II diastolic dysfunction (pseudonormalization). Right Ventricle: The right ventricular size is normal. No increase in right ventricular wall thickness. Right ventricular systolic function is normal. There is normal pulmonary artery systolic pressure. The tricuspid regurgitant velocity is 2.16 m/s, and  with an assumed right atrial pressure of 3 mmHg, the estimated right ventricular systolic pressure is 09.3 mmHg. Left Atrium: Left atrial size was normal in size. Right Atrium: Right atrial size was normal in size. Pericardium: There is no evidence of pericardial effusion. Presence of epicardial fat layer. Mitral Valve: The mitral valve is normal in structure. Trivial mitral valve regurgitation. No evidence of mitral valve stenosis. Tricuspid Valve: The tricuspid valve is normal  in structure. Tricuspid valve regurgitation is mild . No evidence of tricuspid stenosis. Aortic Valve: The aortic valve is tricuspid. There is mild thickening of the aortic valve. Aortic valve regurgitation is not visualized. Pulmonic Valve: The pulmonic valve was normal in structure. Pulmonic valve regurgitation is mild. No evidence of pulmonic stenosis. Aorta: The aortic root and ascending aorta are structurally normal, with no evidence of dilitation. IAS/Shunts: No atrial level shunt detected by color flow Doppler.  LEFT VENTRICLE PLAX 2D LVIDd:         3.60 cm         Diastology LVIDs:         2.30 cm         LV e' medial:    4.05 cm/s LV PW:         1.10 cm         LV E/e' medial:  17.2 LV IVS:        1.50 cm         LV e' lateral:   4.87 cm/s LVOT diam:     2.40 cm         LV E/e' lateral: 14.3 LV SV:         55 LV SV Index:   31 LVOT Area:     4.52 cm        3D Volume EF                                LV 3D EF:    Left                                             ventricul                                             ar                                             ejection  fraction                                             by 3D                                             volume is                                             68 %.                                 3D Volume EF:                                3D EF:        68 %                                LV EDV:       96 ml                                LV ESV:       30 ml                                LV SV:        66 ml RIGHT VENTRICLE RV Basal diam:  3.80 cm RV Mid diam:    2.00 cm RV S prime:     12.90 cm/s TAPSE (M-mode): 1.4 cm LEFT ATRIUM             Index        RIGHT ATRIUM           Index LA diam:        4.00 cm 2.28 cm/m   RA Area:     13.60 cm LA Vol (A2C):   55.7 ml 31.78 ml/m  RA Volume:   33.30 ml  19.00 ml/m LA Vol (A4C):   33.5 ml 19.11 ml/m LA Biplane Vol: 43.5 ml 24.82 ml/m  AORTIC VALVE  LVOT Vmax:   72.40 cm/s LVOT Vmean:  48.500 cm/s LVOT VTI:    0.122 m  AORTA Ao Root diam: 3.20 cm Ao Asc diam:  2.80 cm MITRAL VALVE               TRICUSPID VALVE MV Area (PHT): 3.43 cm    TR Peak grad:   18.7 mmHg MV Decel Time: 221 msec    TR Vmax:        216.00 cm/s MV E velocity: 69.80 cm/s MV A velocity: 69.40 cm/s  SHUNTS MV E/A ratio:  1.01        Systemic VTI:  0.12 m  Systemic Diam: 2.40 cm Rudean Haskell MD Electronically signed by Rudean Haskell MD Signature Date/Time: 02/07/2022/1:27:00 PM    Final    CT Angio Chest PE W and/or Wo Contrast  Result Date: 02/06/2022 CLINICAL DATA:  Provided history: Pulmonary embolism (PE) suspected, high prob reduce dose contrast Dural venous and constipation. Recent hip replacement. EXAM: CT ANGIOGRAPHY CHEST WITH CONTRAST TECHNIQUE: Multidetector CT imaging of the chest was performed using the standard protocol during bolus administration of intravenous contrast. Multiplanar CT image reconstructions and MIPs were obtained to evaluate the vascular anatomy. RADIATION DOSE REDUCTION: This exam was performed according to the departmental dose-optimization program which includes automated exposure control, adjustment of the mA and/or kV according to patient size and/or use of iterative reconstruction technique. CONTRAST:  80 cc Omnipaque 350 IV COMPARISON:  Radiograph earlier today. Chest CT 03/17/2021. FINDINGS: Cardiovascular: Isolated subsegmental embolus in the medial right middle lobe pulmonary artery, for example series 9, image 171 and series 10, image 58. No additional pulmonary emboli. Calcified and noncalcified atheromatous plaque throughout the thoracic aorta. No aneurysm or dissection. Post CABG. Upper normal heart size. No pericardial effusion. Mediastinum/Nodes: No enlarged mediastinal or hilar lymph nodes. No visible thyroid nodule. Tiny hiatal hernia. Lungs/Pleura: Small right and trace left pleural effusion.  Associated atelectasis. The previous subpleural right lower lobe nodule is obscured on the current exam due to pleural fluid and atelectasis. Postsurgical change in the left upper lobe. There is mild bronchial thickening in the lower lobes. Upper Abdomen: No acute upper abdominal findings. Elongated left lobe of the liver. Musculoskeletal: Stable punctate sclerotic focus within T4 vertebral body likely a bone island. Median sternotomy. No acute osseous findings. Review of the MIP images confirms the above findings. IMPRESSION: 1. Isolated subsegmental embolus in the medial right middle lobe pulmonary artery. 2. Small right and trace left pleural effusion. Associated atelectasis. 3. Mild bronchial thickening in the lower lobes. Aortic Atherosclerosis (ICD10-I70.0). Critical Value/emergent results were called by telephone at the time of interpretation on 02/06/2022 at 10:25 pm to provider DAVID YAO , who verbally acknowledged these results. Electronically Signed   By: Keith Rake M.D.   On: 02/06/2022 22:26   CT Head Wo Contrast  Result Date: 02/06/2022 CLINICAL DATA:  Mental status change, unknown cause EXAM: CT HEAD WITHOUT CONTRAST TECHNIQUE: Contiguous axial images were obtained from the base of the skull through the vertex without intravenous contrast. RADIATION DOSE REDUCTION: This exam was performed according to the departmental dose-optimization program which includes automated exposure control, adjustment of the mA and/or kV according to patient size and/or use of iterative reconstruction technique. COMPARISON:  02/15/2020 FINDINGS: Brain: No evidence of acute infarction, hemorrhage, mass, mass effect, or midline shift. No hydrocephalus or extra-axial fluid collection. Mildly advanced cerebral atrophy for age. Vascular: No hyperdense vessel. Skull: Normal. Negative for fracture or focal lesion. Sinuses/Orbits: No acute finding. Status post bilateral lens replacements. Other: The mastoid air cells are  well aerated. IMPRESSION: No acute intracranial process. Electronically Signed   By: Merilyn Baba M.D.   On: 02/06/2022 14:50   DG Chest 2 View  Result Date: 02/06/2022 CLINICAL DATA:  Cough EXAM: CHEST - 2 VIEW COMPARISON:  Chest x-ray dated Sep 26, 2019 FINDINGS: Cardiac and mediastinal contours are within normal limits. Prior median sternotomy and CABG. Small bilateral pleural effusions and bibasilar atelectasis. No evidence of pneumothorax. IMPRESSION: Small bilateral pleural effusions and bibasilar atelectasis. Electronically Signed   By: Yetta Glassman M.D.   On: 02/06/2022 14:49  Pending Labs Unresulted Labs (From admission, onward)     Start     Ordered   02/08/22 0500  CBC  Tomorrow morning,   R        02/07/22 1322   02/08/22 2505  Basic metabolic panel  Tomorrow morning,   R        02/07/22 1322   02/06/22 2302  Culture, Urine (Do not remove urinary catheter, catheter placed by urology or difficult to place)  (Urine Culture)  Add-on,   AD       Question:  Indication  Answer:  Dysuria   02/06/22 2320            Vitals/Pain Today's Vitals   02/07/22 1130 02/07/22 1239 02/07/22 1545 02/07/22 1715  BP: 126/62 (!) 127/47 119/72 (!) 127/55  Pulse: 75 84 82 99  Resp: '16 14 19 '$ (!) 22  Temp:   98.7 F (37.1 C)   TempSrc:   Oral   SpO2: 97% 97% 97% 96%  Weight:      Height:      PainSc:   0-No pain     Isolation Precautions No active isolations  Medications Medications  sodium chloride flush (NS) 0.9 % injection 3 mL (3 mLs Intravenous Not Given 02/07/22 0928)  acetaminophen (TYLENOL) tablet 650 mg (has no administration in time range)    Or  acetaminophen (TYLENOL) suppository 650 mg (has no administration in time range)  polyethylene glycol (MIRALAX / GLYCOLAX) packet 17 g (has no administration in time range)  Golimumab SOAJ ( Oral Not Given 02/07/22 0958)  leflunomide (ARAVA) tablet 20 mg (20 mg Oral Given 02/07/22 0922)  amLODipine (NORVASC) tablet 2.5 mg  (2.5 mg Oral Given 02/07/22 0925)  metoprolol tartrate (LOPRESSOR) tablet 50 mg (50 mg Oral Given 02/07/22 0921)  hydrochlorothiazide (HYDRODIURIL) tablet 12.5 mg (12.5 mg Oral Given 02/07/22 0922)  atorvastatin (LIPITOR) tablet 80 mg (80 mg Oral Given 02/07/22 0922)  levothyroxine (SYNTHROID) tablet 88 mcg (88 mcg Oral Given 02/07/22 0658)  famotidine (PEPCID) tablet 20 mg (20 mg Oral Given 02/07/22 0921)  pantoprazole (PROTONIX) EC tablet 40 mg (40 mg Oral Given 02/07/22 0922)  gabapentin (NEURONTIN) capsule 300 mg (300 mg Oral Given 02/07/22 1544)  albuterol (PROVENTIL) (2.5 MG/3ML) 0.083% nebulizer solution 3 mL (has no administration in time range)  arformoterol (BROVANA) nebulizer solution 15 mcg (15 mcg Nebulization Given 02/07/22 0739)    And  umeclidinium bromide (INCRUSE ELLIPTA) 62.5 MCG/ACT 1 puff (1 puff Inhalation Given 02/07/22 0809)  tamsulosin (FLOMAX) capsule 0.4 mg (0.4 mg Oral Given 02/07/22 1346)  apixaban (ELIQUIS) tablet 10 mg (10 mg Oral Given 02/07/22 1721)    Followed by  apixaban (ELIQUIS) tablet 5 mg (has no administration in time range)  vancomycin (VANCOCIN) IVPB 1000 mg/200 mL premix (0 mg Intravenous Stopped 02/06/22 2255)  ceFEPIme (MAXIPIME) 2 g in sodium chloride 0.9 % 100 mL IVPB (0 g Intravenous Stopped 02/06/22 2150)  sodium chloride 0.9 % bolus 1,000 mL (0 mLs Intravenous Stopped 02/06/22 2150)  magnesium sulfate IVPB 2 g 50 mL (0 g Intravenous Stopped 02/06/22 2255)  iohexol (OMNIPAQUE) 350 MG/ML injection 80 mL (80 mLs Intravenous Contrast Given 02/06/22 2213)  heparin bolus via infusion 4,000 Units (4,000 Units Intravenous Bolus from Bag 02/06/22 2303)    Mobility walks with device Moderate fall risk   Focused Assessments Neuro Assessment Handoff:   Cardiac Rhythm: Normal sinus rhythm       Neuro Assessment: Within Defined Limits Neuro Checks:  Last Documented NIHSS Modified Score:   Has TPA been given? No If patient is a Neuro Trauma  and patient is going to OR before floor call report to [place] nurse: [phone] or [phone]   R Recommendations: See Admitting Provider Note  Report given to:   Additional Notes:

## 2022-02-07 NOTE — Progress Notes (Signed)
Lower extremity venous has been completed.   Preliminary results in CV Proc.   [name] 02/07/2022 2:47 PM

## 2022-02-07 NOTE — Progress Notes (Signed)
ANTICOAGULATION CONSULT NOTE - Initial Consult  Pharmacy Consult for Apixaban Indication: pulmonary embolus  Allergies  Allergen Reactions   Nsaids Other (See Comments)    Pt states all NSAIDS does not sit well onstomach   Aspirin Other (See Comments)    Hurts stomach   Codeine Nausea And Vomiting   Nitroglycerin Other (See Comments)    Heart rate drops    Patient Measurements: Height: '5\' 3"'$  (160 cm) Weight: 72 kg (158 lb 11.7 oz) IBW/kg (Calculated) : 52.4  Vital Signs: Temp: 98.1 F (36.7 C) (10/10 1119) Temp Source: Oral (10/10 1119) BP: 127/47 (10/10 1239) Pulse Rate: 84 (10/10 1239)  Labs: Recent Labs    02/06/22 1529 02/06/22 2031 02/07/22 0315 02/07/22 0659  HGB 9.6*  --  8.2*  --   HCT 29.8*  --  25.8*  --   PLT 422*  --  385  --   HEPARINUNFRC  --   --   --  0.65  CREATININE 1.89*  --  1.90*  --   TROPONINIHS 47* 42*  --   --     Estimated Creatinine Clearance: 21.3 mL/min (A) (by C-G formula based on SCr of 1.9 mg/dL (H)).   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 Most likely scar. Mild subendocardial ischemia. EF 71% LOW RISK.    Cervical disc disease    fracture   Chronic kidney disease    COPD (chronic obstructive pulmonary disease) (HCC)    Diverticulosis    Hyperlipidemia    Hypertension    Hypothyroidism (acquired)     hypo   Myocardial infarction ([place]) 11/2014   TIA   Polymyalgia rheumatica ([place])    2011 Dr. Charlestine Night   Rheumatoid arthritis [facility]) 2011   Dr.Truslow; fracture knees, hands and wrists -    S/P CABG x 1 1996   CABG--LIMA-LAD for ostial LAD (not felt to be PCI amenable). EF NORMAL then; LIMA now atretic   Shortness of breath dyspnea    with exertion   Stroke ([place]) 11/2014   TIA    Urinary frequency     Medications:  (Not in a hospital admission)  Scheduled:   amLODipine  2.5 mg Oral Daily   arformoterol  15 mcg Nebulization BID   And   umeclidinium bromide  1 puff Inhalation Daily   atorvastatin  80 mg Oral Daily   famotidine  20 mg Oral Daily   gabapentin  300 mg Oral TID   Golimumab   Oral Daily   hydrochlorothiazide  12.5 mg Oral Daily   leflunomide  20 mg Oral Daily   levothyroxine  88 mcg Oral QAC breakfast   metoprolol tartrate  50 mg Oral BID   pantoprazole  40 mg Oral Daily   sodium chloride flush  3 mL Intravenous Q12H   tamsulosin  0.4 mg Oral Daily    Assessment: 81yo F presenting with increased drowsiness, fatigue and shortness of breath. PMH significant for DVT, s/p hip replacement on 9/26. CT angiogram showed isolated PE, pharmacy has been consulted for apixaban dosing for acute PE treatment.  H&H are low, no s/s of bleeding noted  Goal of Therapy:  Therapeutic anticoagulation Monitor platelets by anticoagulation protocol: Yes  Plan:  Initiate Apixaban '10mg'$  BID for 7 days followed by '5mg'$  BID  Monitor h&h for continued decline, s/s of bleeding and resolution of PE symptoms  Titus Dubin, PharmD PGY1 Pharmacy Resident 02/07/2022 4:33 PM

## 2022-02-07 NOTE — Plan of Care (Signed)
  Problem: Activity: Goal: Ability to avoid complications of mobility impairment will improve Outcome: Progressing   Problem: Pain Management: Goal: Pain level will decrease with appropriate interventions Outcome: Progressing   Problem: Activity: Goal: Risk for activity intolerance will decrease Outcome: Progressing   Problem: Safety: Goal: Ability to remain free from injury will improve Outcome: Progressing

## 2022-02-07 NOTE — Progress Notes (Signed)
I went by to see [name] today after learning about her newly diagnosed PE.  She has had a tough time recovering from her left THA that was done on 01/23/22.  Her postop course was complicated by immobility, failure to thrive, urinary retention, and an age indeterminate DVT in the nonoperative leg.    She's in good spirits when I saw her today.  She doesn't have any real complaints regarding her hip other than some expected postoperative swelling and soreness.  Her surgical incision is healed without any drainage or evidence of infection.  Thigh is soft with moderate swelling.    I will place an order to have the sutures removed and steri strips applied with dry bandage.  Will repeat xrays of the pelvis.  Will place her on cefadroxil 500 mg BID while she has an indwelling foley and has new hip replacement.  I have discontinued the golimumab for the same reason.  She may resume this med in 2 weeks.  Mobilize with PT as soon as she can.  Azucena Cecil, MD [facility] 8:38 PM

## 2022-02-07 NOTE — Progress Notes (Signed)
ANTICOAGULATION CONSULT NOTE - Initial Consult  Pharmacy Consult for heparin Indication: pulmonary embolus  Allergies  Allergen Reactions   Nsaids Other (See Comments)    Pt states all NSAIDS does not sit well onstomach   Aspirin Other (See Comments)    Hurts stomach   Codeine Nausea And Vomiting   Nitroglycerin Other (See Comments)    Heart rate drops    Patient Measurements: Height: '5\' 3"'$  (160 cm) Weight: 72 kg (158 lb 11.7 oz) IBW/kg (Calculated) : 52.4 Heparin Dosing Weight: 67.5kg  Vital Signs: Temp: 97.8 F (36.6 C) (10/10 0735) Temp Source: Oral (10/10 0735) BP: 150/47 (10/10 0735) Pulse Rate: 90 (10/10 0735)  Labs: Recent Labs    02/06/22 1529 02/06/22 2031 02/07/22 0315 02/07/22 0659  HGB 9.6*  --  8.2*  --   HCT 29.8*  --  25.8*  --   PLT 422*  --  385  --   HEPARINUNFRC  --   --   --  0.65  CREATININE 1.89*  --  1.90*  --   TROPONINIHS 47* 42*  --   --      Estimated Creatinine Clearance: 21.3 mL/min (A) (by C-G formula based on SCr of 1.9 mg/dL (H)).   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 Most likely scar. Mild subendocardial ischemia. EF 71% LOW RISK.    Cervical disc disease    fracture   Chronic kidney disease    COPD (chronic obstructive pulmonary disease) (HCC)    Diverticulosis    Hyperlipidemia     Hypertension    Hypothyroidism (acquired)    hypo   Myocardial infarction ([place]) 11/2014   TIA   Polymyalgia rheumatica ([place])    2011 Dr. Charlestine Night   Rheumatoid arthritis [facility]) 2011   Dr.Truslow; fracture knees, hands and wrists -    S/P CABG x 1 1996   CABG--LIMA-LAD for ostial LAD (not felt to be PCI amenable). EF NORMAL then; LIMA now atretic   Shortness of breath dyspnea    with exertion   Stroke ([place]) 11/2014   TIA    Urinary frequency     Assessment: 89 YOF presenting with somnolence, Ct angio chest with PE, has been on lovenox 40 mg SQ q 24h PTA (last dose 10/9) s/p hip surgery.    Initial heparin level therapeutic at 0.65. Hemoglobin with a downward trend, no overt bleeding noted.   Goal of Therapy:  Heparin level 0.3-0.7 units/ml Monitor platelets by anticoagulation protocol: Yes   Plan:  Continue heparin at 1150 units/hr.  Recheck heparin level in 8 hours.  F/u long term [facility] plan  Esmeralda Arthur, PharmD  Clinical Pharmacist ED Pharmacist Phone # [phone] 02/07/2022 8:02 AM

## 2022-02-08 ENCOUNTER — Telehealth (HOSPITAL_COMMUNITY): Payer: Self-pay | Admitting: Pharmacy Technician

## 2022-02-08 ENCOUNTER — Other Ambulatory Visit (HOSPITAL_COMMUNITY): Payer: Self-pay

## 2022-02-08 DIAGNOSIS — E039 Hypothyroidism, unspecified: Secondary | ICD-10-CM

## 2022-02-08 DIAGNOSIS — I2699 Other pulmonary embolism without acute cor pulmonale: Secondary | ICD-10-CM | POA: Diagnosis not present

## 2022-02-08 DIAGNOSIS — E785 Hyperlipidemia, unspecified: Secondary | ICD-10-CM | POA: Diagnosis not present

## 2022-02-08 DIAGNOSIS — N184 Chronic kidney disease, stage 4 (severe): Secondary | ICD-10-CM | POA: Diagnosis not present

## 2022-02-08 DIAGNOSIS — I714 Abdominal aortic aneurysm, without rupture, unspecified: Secondary | ICD-10-CM | POA: Diagnosis not present

## 2022-02-08 LAB — CBC
HCT: 22.7 % — ABNORMAL LOW (ref 36.0–46.0)
Hemoglobin: 7.9 g/dL — ABNORMAL LOW (ref 12.0–15.0)
MCH: 32.5 pg (ref 26.0–34.0)
MCHC: 34.8 g/dL (ref 30.0–36.0)
MCV: 93.4 fL (ref 80.0–100.0)
Platelets: 386 10*3/uL (ref 150–400)
RBC: 2.43 MIL/uL — ABNORMAL LOW (ref 3.87–5.11)
RDW: 16.2 % — ABNORMAL HIGH (ref 11.5–15.5)
WBC: 11.1 10*3/uL — ABNORMAL HIGH (ref 4.0–10.5)
nRBC: 0.9 % — ABNORMAL HIGH (ref 0.0–0.2)

## 2022-02-08 LAB — BASIC METABOLIC PANEL
Anion gap: 14 (ref 5–15)
BUN: 35 mg/dL — ABNORMAL HIGH (ref 8–23)
CO2: 20 mmol/L — ABNORMAL LOW (ref 22–32)
Calcium: 8.7 mg/dL — ABNORMAL LOW (ref 8.9–10.3)
Chloride: 100 mmol/L (ref 98–111)
Creatinine, Ser: 1.91 mg/dL — ABNORMAL HIGH (ref 0.44–1.00)
GFR, Estimated: 26 mL/min — ABNORMAL LOW (ref >60)
Glucose, Bld: 117 mg/dL — ABNORMAL HIGH (ref 70–99)
Potassium: 3.8 mmol/L (ref 3.5–5.1)
Sodium: 134 mmol/L — ABNORMAL LOW (ref 135–145)

## 2022-02-08 LAB — URINE CULTURE: Culture: 80000 — AB

## 2022-02-08 MED ORDER — HYDROCODONE BIT-HOMATROP MBR 5-1.5 MG/5ML PO SOLN: 5.0000 mL | ORAL | Status: DC | PRN

## 2022-02-08 MED ORDER — CHLORHEXIDINE GLUCONATE CLOTH 2 % EX PADS
6.0000 | MEDICATED_PAD | Freq: Every day | CUTANEOUS | Status: DC
  Administered 2022-02-09 – 2022-02-10 (×2): 6 via TOPICAL

## 2022-02-08 NOTE — Telephone Encounter (Signed)
Pharmacy Patient Advocate Encounter  Insurance verification completed.    The patient is insured through [employer] Part D   The patient is currently admitted and ran test claims for the following: Eliquis .  Copays and coinsurance results were relayed to Inpatient clinical team.      

## 2022-02-08 NOTE — Evaluation (Signed)
Occupational Therapy Evaluation Patient Details Name: [name] MRN: [id number] DOB: [birth year] Today's Date: 02/08/2022   History of Present Illness [name] is a 81 y.o. female who presented from [facility] due to increasing shortness of breath, fatigue and drowsiness. CT revealed PE.    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 Upon arrival pt was lethargic, increased LOA with sitting EOB given min G for transfer. She was able to complete peri care in standing with mod A and take pivotal steps to the recliner with RW and min G. Anticipate pt will progress well acutely. Recommend d/c back to [place] with support of husband for LB ADLs and HHOT.   Recommendations for follow up therapy are one component of a multi-disciplinary discharge planning process, led by the attending physician.  Recommendations may be updated based on patient status, additional functional criteria and insurance authorization.   Follow Up Recommendations  Home health OT    Assistance Recommended at Discharge Frequent or constant Supervision/Assistance  Patient can return home with the following A little help with walking and/or transfers;A lot of help with bathing/dressing/bathroom;Assistance with cooking/housework;Help with stairs or ramp for entrance;Assist for transportation    Functional Status Assessment  Patient has had a recent decline in their functional  status and demonstrates the ability to make significant improvements in function in a reasonable and predictable amount of time.  Equipment Recommendations  BSC/3in1;Wheelchair (measurements OT);Wheelchair cushion (measurements OT)    Recommendations for Other Services       Precautions / Restrictions Precautions Precautions: Fall Restrictions Weight Bearing Restrictions: No LLE Weight Bearing: Weight bearing as tolerated      Mobility Bed Mobility Overal bed mobility: Needs Assistance Bed Mobility: Supine to Sit     Supine to sit: Min guard     General bed mobility comments: increased time, HOB elevated    Transfers Overall transfer level: Needs assistance Equipment used: Rolling walker (2 wheels) Transfers: Sit to/from Stand, Bed to chair/wheelchair/BSC Sit to Stand: Min guard     Step pivot transfers: Min guard     General transfer comment: increased time for initial stand      Balance Overall balance assessment: Needs assistance Sitting-balance support: No upper extremity supported, Feet supported Sitting balance-Leahy Scale: Good     Standing balance support: Bilateral upper extremity supported, Reliant on assistive device for balance Standing balance-Leahy Scale: Poor                             ADL either performed or assessed with clinical judgement   ADL Overall ADL's : Needs assistance/impaired Eating/Feeding: Independent;Sitting   Grooming: Set up;Sitting   Upper Body Bathing: Set up;Sitting   Lower Body Bathing: Moderate assistance;Sit to/from stand   Upper Body Dressing : Set up;Sitting   Lower Body Dressing: Moderate assistance;Sit to/from stand   Toilet Transfer: Min guard;Ambulation;Rolling walker (2 wheels)   Toileting- Clothing Manipulation and Hygiene: Supervision/safety;Sitting/lateral lean  Functional mobility during ADLs: Min guard;Rolling walker (2 wheels) General ADL Comments: assist for residual LLE  impairments     Vision Baseline Vision/History: 1 Wears glasses Patient Visual Report: No change from baseline Vision Assessment?: No apparent visual deficits     Perception     Praxis      Pertinent Vitals/Pain Pain Assessment Pain Assessment: Faces Faces Pain Scale: Hurts a little bit Pain Location: L hip Pain Descriptors / Indicators: Sore Pain Intervention(s): Monitored during session     Hand Dominance Left   Extremity/Trunk Assessment Upper Extremity Assessment Upper Extremity Assessment: Generalized weakness   Lower Extremity Assessment Lower Extremity Assessment: Defer to PT evaluation   Cervical / Trunk Assessment Cervical / Trunk Assessment: Normal   Communication Communication Communication: No difficulties   Cognition Arousal/Alertness: Awake/alert, Lethargic Behavior During Therapy: Flat affect Overall Cognitive Status: Impaired/Different from baseline Area of Impairment: Memory, Following commands, Safety/judgement, Awareness, Problem solving                   Current Attention Level: Selective Memory: Decreased short-term memory, Decreased recall of precautions Following Commands: Follows one step commands consistently Safety/Judgement: Decreased awareness of safety, Decreased awareness of deficits Awareness: Emergent Problem Solving: Slow processing, Requires verbal cues General Comments: having some difficulty detailing PLOF since THA. Initally lethargic upon arrival, increased LAO with sitting EOB     General Comments  VSS on RA. RN present    Exercises     Shoulder Instructions      Home Living Family/patient expects to be discharged to:: Private residence Living Arrangements: Spouse/significant other Available Help at Discharge: Family;Available 24 hours/day Type of Home: Independent living facility Home Access: [place]: One level     Bathroom Shower/Tub: Teacher, early years/pre: Handicapped  height     Home Equipment: Conservation officer, nature (2 wheels);Rollator (4 wheels);Cane - single point;Shower seat;Grab bars - tub/shower;Grab bars - toilet;Hand held shower head   Additional Comments: [facility], has been in SNF since THA      Prior Functioning/Environment Prior Level of Function : Needs assist             Mobility Comments: Currently workign with, PT. Able to walk hallway distances with rehab. Otherside indep in SP transfers to [facility] ADLs Comments: Sponge bathing since THA, staff assists with most ADLs        OT Problem List: Decreased strength;Impaired balance (sitting and/or standing);Decreased cognition;Decreased knowledge of use of DME or AE;Pain      OT Treatment/Interventions: Self-care/ADL training;DME and/or AE instruction;Therapeutic activities;Cognitive remediation/compensation;Patient/family education;Balance training    OT Goals(Current goals can be found in the care plan section) Acute Rehab OT Goals Patient Stated Goal: home OT Goal Formulation: With patient Time For Goal Achievement: 02/22/22 Potential to Achieve Goals: Good ADL Goals Pt Will Perform Grooming: with supervision;standing Pt Will Perform Lower Body Bathing: with supervision;sit to/from stand;with adaptive equipment Pt Will Perform Lower Body Dressing: with supervision;sit to/from stand;with adaptive equipment Pt Will Transfer to Toilet: with supervision;ambulating;bedside commode  OT Frequency: Min 2X/week    Co-evaluation              AM-PAC OT "6 Clicks" Daily Activity     Outcome Measure Help from another person eating meals?: None Help from another person taking care of personal grooming?: A Little Help from another person toileting, which includes using toliet, bedpan, or urinal?: A Little Help from another person bathing (including washing, rinsing, drying)?:  A Lot Help from another person to put on and taking off regular upper body clothing?: A Little Help from  another person to put on and taking off regular lower body clothing?: A Lot 6 Click Score: 17   End of Session Equipment Utilized During Treatment: Gait belt;Rolling walker (2 wheels) Nurse Communication: Mobility status  Activity Tolerance: Patient tolerated treatment well Patient left: in chair;with call bell/phone within reach;with family/visitor present  OT Visit Diagnosis: Unsteadiness on feet (R26.81);Other abnormalities of gait and mobility (R26.89);Pain;Other symptoms and signs involving cognitive function                Time: 8346-2194 OT Time Calculation (min): 24 min Charges:  OT General Charges $OT Visit: 1 Visit OT Evaluation $OT Eval Moderate Complexity: 1 Mod OT Treatments $Self Care/Home Management : 8-22 mins   Elliot Cousin 02/08/2022, 10:16 AM

## 2022-02-08 NOTE — Progress Notes (Signed)
Physical Therapy Evaluation Patient Details Name: [name] MRN: [id number] DOB: 04-Apr-1941 Today's Date: 02/08/2022  History of Present Illness  [name] is a 81 y.o. female who [facility] from [facility] due to increasing shortness of breath, fatigue and drowsiness. CT revealed PE.    PMHx: L THA 01/23/22, R DVT aortic aneurysm, CAD, previous CABG X 1, chronic kidney disease, COPD, cervical DDD, hyperlipidemia, HTN, hypothyroid, rheumatoid arthritis, previous MI and stroke, shortness of breath, previous R bunionectomy, L carpal tunnel release, R foot bunionectomy, cervical spine surgery, multiple abdominal surgeries, osteoporosis  Clinical Impression  Pt was seen for gait and transfers including to manage her trip to [place].  Pt is min assist to stand from lowest surface but otherwise min guard for all others, and min guard with dense cues for clearing obstacles on RW.  Pt is proposing to go home directly but husband asking for SNF stay again.  Follow along with her with plan to go to SNF initially, then to transfer to home as she is able.  Pt is still with her sutures from her surgery, and will recommend her to work on goals of acute PT focusing on balance, L hip strength and control of walker and gait as the other issues resolve to give her safe independent mobility.       Recommendations for follow up therapy are one component of a multi-disciplinary discharge planning process, led by the attending physician.  Recommendations may be updated based on patient status, additional functional criteria and insurance authorization.  Follow Up Recommendations Skilled nursing-short term rehab (<3 hours/day) Can patient physically be transported by private vehicle: No    Assistance Recommended at Discharge Frequent or constant Supervision/Assistance  Patient can return home with the following  A little help with walking and/or transfers;A little help with bathing/dressing/bathroom;Assistance  with cooking/housework;Assist for transportation;Help with stairs or ramp for entrance    Equipment Recommendations Rolling walker (2 wheels)  Recommendations for Other Services       Functional Status Assessment Patient has had a recent decline in their functional status and demonstrates the ability to make significant improvements in function in a reasonable and predictable amount of time.     Precautions / Restrictions Precautions Precautions: Fall Restrictions Weight Bearing Restrictions: No LLE Weight Bearing: Weight bearing as tolerated      Mobility  Bed Mobility               General bed mobility comments: up inchair when PT arrived    Transfers Overall transfer level: Needs assistance Equipment used: Rolling walker (2 wheels) Transfers: Sit to/from Stand Sit to Stand: Min guard           General transfer comment: instructions for hand placement to set up well    Ambulation/Gait Ambulation/Gait assistance: Min guard Gait Distance (Feet): 45 Feet Assistive device: Rolling walker (2 wheels) Gait Pattern/deviations: Decreased stride length, Decreased stance time - left Gait velocity: reduced Gait velocity interpretation: <1.31 ft/sec, indicative of household ambulator   General Gait Details: pt is up to walk on RW with cues for avoiding obstacles and for correction of course.  Mild inattention to setting up to sit  Stairs            Wheelchair Mobility    Modified Rankin (Stroke Patients Only)       Balance Overall balance assessment: Needs assistance Sitting-balance support: Feet supported Sitting balance-Leahy Scale: Good     Standing balance support: Bilateral upper extremity  supported, During functional activity Standing balance-Leahy Scale: Poor                               Pertinent Vitals/Pain Pain Assessment Pain Assessment: Faces Faces Pain Scale: Hurts little more Pain Location: L hip Pain Descriptors /  Indicators: Guarding Pain Intervention(s): Limited activity within patient's tolerance, Monitored during session, Premedicated before session, Repositioned    Home Living Family/patient expects to be discharged to:: Private residence Living Arrangements: Spouse/significant other Available Help at Discharge: Family;Available 24 hours/day Type of Home: Independent living facility Home Access: [place]: One level Home Equipment: Conservation officer, nature (2 wheels);Rollator (4 wheels);Cane - single point;Shower seat;Grab bars - tub/shower;Grab bars - toilet;Hand held shower head Additional Comments: [name], has been in SNF since THA    Prior Function Prior Level of Function : Needs assist       Physical Assist : Mobility (physical) Mobility (physical): Gait   Mobility Comments: using RW but prev was on rollator       Hand Dominance   Dominant Hand: Left    Extremity/Trunk Assessment   Upper Extremity Assessment Upper Extremity Assessment: Defer to OT evaluation    Lower Extremity Assessment Lower Extremity Assessment: Generalized weakness;LLE deficits/detail LLE Deficits / Details: weakness and mild giving out during gait on RW LLE: Unable to fully assess due to immobilization LLE Coordination: decreased gross motor    Cervical / Trunk Assessment Cervical / Trunk Assessment: Kyphotic (mild)  Communication   Communication: No difficulties  Cognition Arousal/Alertness: Awake/alert Behavior During Therapy: WFL for tasks assessed/performed, Flat affect Overall Cognitive Status: Impaired/Different from baseline Area of Impairment: Problem solving, Awareness, Safety/judgement, Following commands, Memory, Attention, Orientation                 Orientation Level: Time Current Attention Level: Selective Memory: Decreased short-term memory Following Commands: Follows one step commands with increased time Safety/Judgement: Decreased awareness of  deficits Awareness: Intellectual Problem Solving: Slow processing, Requires verbal cues, Requires tactile cues General Comments: pt gave some inconsistent answers to questions about PLOF and her husband filled in details        General Comments General comments (skin integrity, edema, etc.): O2 sats were 90% or greater to walk in the room, with pulses stable in 70's    Exercises     Assessment/Plan    PT Assessment Patient needs continued PT services  PT Problem List Decreased strength;Decreased range of motion;Decreased activity tolerance;Decreased balance;Decreased mobility;Decreased knowledge of use of DME;Decreased knowledge of precautions;Pain;Decreased safety awareness       PT Treatment Interventions DME instruction;Gait training;Functional mobility training;Therapeutic activities;Therapeutic exercise;Balance training;Patient/family education;Neuromuscular re-education    PT Goals (Current goals can be found in the Care Plan section)  Acute Rehab PT Goals Patient Stated Goal: to go home PT Goal Formulation: With patient/family Time For Goal Achievement: 02/15/22 Potential to Achieve Goals: Good    Frequency Min 5X/week     Co-evaluation               AM-PAC PT "6 Clicks" Mobility  Outcome Measure Help needed turning from your back to your side while in a flat bed without using bedrails?: A Little Help needed moving from lying on your back to sitting on the side of a flat bed without using bedrails?: A Little Help needed moving to and from a bed to a chair (including a wheelchair)?: A Little  Help needed standing up from a chair using your arms (e.g., wheelchair or bedside chair)?: A Little Help needed to walk in hospital room?: A Little Help needed climbing 3-5 steps with a railing? : A Lot 6 Click Score: 17    End of Session Equipment Utilized During Treatment: Gait belt Activity Tolerance: Patient tolerated treatment well Patient left: in chair;with call  bell/phone within reach;with chair alarm set;with family/visitor present Nurse Communication: Mobility status PT Visit Diagnosis: Unsteadiness on feet (R26.81);Other abnormalities of gait and mobility (R26.89);Muscle weakness (generalized) (M62.81);Difficulty in walking, not elsewhere classified (R26.2);Pain Pain - Right/Left: Left Pain - part of body: Hip    Time: 6294-7654 PT Time Calculation (min) (ACUTE ONLY): 25 min   Charges:   PT Evaluation $PT Eval Moderate Complexity: 1 Mod PT Treatments $Therapeutic Activity: 8-22 mins       Ramond Dial 02/08/2022, 2:43 PM  Mee Hives, PT PhD Acute Rehab Dept. Number: [place] and [place]

## 2022-02-08 NOTE — Hospital Course (Signed)
[name] is a 81 y.o. female with a history of hypertension, rheumatoid arthritis, macular degeneration, history of splenectomy, mesenteric artery stenosis, CAD s/p CABG, CKD stage IV, hypothyroidism, TIA, COPD, DVT, GERD. Patient presented secondary to increased drowsiness, fatigue and shortness of breath and was found to have evidence of acute PE while on Lovenox prophylaxis. Heparin IV initiated and patient is now transitioned to Eliquis.

## 2022-02-08 NOTE — Progress Notes (Signed)
PROGRESS NOTE    [name]  BZJ:[id number] DOB: 1940/11/12 DOA: 02/06/2022 PCP: Cassandria Anger, MD   Brief Narrative: [name] is a 81 y.o. female with a history of hypertension, rheumatoid arthritis, macular degeneration, history of splenectomy, mesenteric artery stenosis, CAD s/p CABG, CKD stage IV, hypothyroidism, TIA, COPD, DVT, GERD. Patient presented secondary to increased drowsiness, fatigue and shortness of breath and was found to have evidence of acute PE while on Lovenox prophylaxis. Heparin IV initiated and patient is now transitioned to Eliquis.   Assessment and Plan:  Acute PE Provoked in setting of recent total left hip arthroplasty. Associated recent age indeterminate right lower extremity DVT which is not identifiable on recent LE venous duplex. CTA chest with subsegmental PE in the right middle lobe and no associated heart strain. Patient managed on Heparin IV initially. Transthoracic Echocardiogram without evidence of RV dysfunction. Heparin IV transitioned to Eliquis. -Continue Eliquis PO  Acute respiratory failure with hypoxia Secondary to acute PE. Weaned to room air. -Ambulatory pulse ox prior to discharge.  Leukocytosis Mildly elevated. Trended down slightly.  Urinary retention Diagnosed from previous admission and currently has an indwelling foley in place. -Continue foley catheter and follow-up with urology as an outpatient.  History of left THA Patient was previously at SNF. -PT/OT recommendations  CKD stage IV Creatinine is stable.  Normocytic anemia Hemoglobin trending down. No obvious evidence of bleeding. Complicated by heparin IV. -Repeat CBC in AM  Rheumatoid arthritis Patient is on golimumab and leflunomide as an outpatient. Golimumab held by orthopedic surgery with recommendations to resume in 2 weeks. -Continue leflunomide  CAD Noted. History of CABG. -Continue metoprolol and Lipitor  Hypothyroidism -Continue  Synthroid 88 mcg daily  History of TIA Noted. -Continue Lipitor  GERD -Continue Protonix  History of abdominal aortic aneurysm S/p repair.  Mesenteric artery stenosis -Continue Lipitor and Plavix  DVT prophylaxis: Heparin IV/Eliquis Code Status:   Code Status: Full Code Family Communication: Husband at bedside Disposition Plan: Discharge home vs SNF likely in 24 hours   Consultants:  None  Procedures:  None  Antimicrobials: Vancomycin Cefepime Cefadroxil    Subjective: Non-productive coughing. No hemoptysis. No dyspnea. No chest pain. No other issues.  Objective: BP (!) 114/55 (BP Location: Right Arm)   Pulse (!) 106   Temp 98.4 F (36.9 C) (Oral)   Resp 16   Ht '5\' 3"'$  (1.6 m)   Wt 70.9 kg   SpO2 95%   BMI 27.69 kg/m   Examination:  General exam: Appears calm and comfortable Respiratory system: Rhonchi. Respiratory effort normal. Cardiovascular system: S1 & S2 heard, RRR. No murmurs, rubs, gallops or clicks. Gastrointestinal system: Abdomen is distended, soft and nontender. Normal bowel sounds heard. Central nervous system: Alert and oriented. No focal neurological deficits. Musculoskeletal: BLE edema. No calf tenderness Skin: No cyanosis. No rashes Psychiatry: Judgement and insight appear normal. Mood & affect appropriate.    Data Reviewed: I have personally reviewed following labs and imaging studies  CBC Lab Results  Component Value Date   WBC 11.1 (H) 02/08/2022   RBC 2.43 (L) 02/08/2022   HGB 7.9 (L) 02/08/2022   HCT 22.7 (L) 02/08/2022   MCV 93.4 02/08/2022   MCH 32.5 02/08/2022   PLT 386 02/08/2022   MCHC 34.8 02/08/2022   RDW 16.2 (H) 02/08/2022   LYMPHSABS 2.7 01/03/2022   MONOABS 1.3 (H) 01/03/2022   EOSABS 0.5 01/03/2022   BASOSABS 0.2 (H) 01/75/1025     Last metabolic  panel Lab Results  Component Value Date   NA 134 (L) 02/08/2022   K 3.8 02/08/2022   CL 100 02/08/2022   CO2 20 (L) 02/08/2022   BUN 35 (H) 02/08/2022    CREATININE 1.91 (H) 02/08/2022   GLUCOSE 117 (H) 02/08/2022   GFRNONAA 26 (L) 02/08/2022   GFRAA 24 (L) 12/26/2019   CALCIUM 8.7 (L) 02/08/2022   PROT 5.2 (L) 02/07/2022   ALBUMIN 2.1 (L) 02/07/2022   BILITOT 0.7 02/07/2022   ALKPHOS 110 02/07/2022   AST 48 (H) 02/07/2022   ALT 29 02/07/2022   ANIONGAP 14 02/08/2022    GFR: Estimated Creatinine Clearance: 21.1 mL/min (A) (by C-G formula based on SCr of 1.91 mg/dL (H)).  Recent Results (from the past 240 hour(s))  Culture, Urine (Do not remove urinary catheter, catheter placed by urology or difficult to place)     Status: Abnormal   Collection Time: 02/06/22  6:00 PM   Specimen: Urine, Catheterized  Result Value Ref Range Status   Specimen Description URINE, CATHETERIZED  Final   Special Requests   Final    NONE Performed at [facility], [place] [address] [place], [place] [address]    Culture 80,000 COLONIES/mL YEAST (A)  Final   Report Status 02/08/2022 FINAL  Final  SARS Coronavirus 2 by RT PCR (hospital order, performed in [facility] hospital lab) *cepheid single result test* Anterior Nasal Swab     Status: None   Collection Time: 02/06/22  6:18 PM   Specimen: Anterior Nasal Swab  Result Value Ref Range Status   SARS Coronavirus 2 by RT PCR NEGATIVE NEGATIVE Final    Comment: (NOTE) SARS-CoV-2 target nucleic acids are NOT DETECTED.  The SARS-CoV-2 RNA is generally detectable in upper and lower respiratory specimens during the acute phase of infection. The lowest concentration of SARS-CoV-2 viral copies this assay can detect is 250 copies / mL. A negative result does not preclude SARS-CoV-2 infection and should not be used as the sole basis for treatment or other patient management decisions.  A negative result may occur with improper specimen collection / handling, submission of specimen other than nasopharyngeal swab, presence of viral mutation(s) within the areas targeted by this assay, and inadequate number of  viral copies (<250 copies / mL). A negative result must be combined with clinical observations, patient history, and epidemiological information.  Fact Sheet for Patients:   https://www.patel.info/  Fact Sheet for Healthcare Providers: https://hall.com/  This test is not yet approved or  cleared by the [place] FDA and has been authorized for detection and/or diagnosis of SARS-CoV-2 by FDA under an Emergency Use Authorization (EUA).  This EUA will remain in effect (meaning this test can be used) for the duration of the COVID-19 declaration under Section 564(b)(1) of the Act, 21 U.S.C. section 360bbb-3(b)(1), unless the authorization is terminated or revoked sooner.  Performed at [facility], [place] [address], [place], [place] [address]       Radiology Studies: VAS [place] LOWER EXTREMITY VENOUS (DVT)  Result Date: 02/07/2022  Lower Venous DVT Study Patient Name:  SARAPHINA LAUDERBAUGH  Date of Exam:   02/07/2022 Medical Rec #: [id number]       Accession #:    [id number] Date of Birth: [birth year]        Patient Gender: F Patient Age:   28 years Exam Location:  [facility] Procedure:      VAS [place] LOWER EXTREMITY VENOUS (DVT) Referring Phys:  ALEXANDER MELVIN --------------------------------------------------------------------------------  Indications: Edema.  Limitations: Pain tolerance. Comparison Study: 01/28/22 prior Performing Technologist: Archie Patten RVS  Examination Guidelines: A complete evaluation includes B-mode imaging, spectral Doppler, color Doppler, and power Doppler as needed of all accessible portions of each vessel. Bilateral testing is considered an integral part of a complete examination. Limited examinations for reoccurring indications may be performed as noted. The reflux portion of the exam is performed with the patient in reverse Trendelenburg.   +---------+---------------+---------+-----------+----------+-------------------+ RIGHT    CompressibilityPhasicitySpontaneityPropertiesThrombus Aging      +---------+---------------+---------+-----------+----------+-------------------+ CFV      Full           Yes      Yes                                      +---------+---------------+---------+-----------+----------+-------------------+ SFJ      Full                                                             +---------+---------------+---------+-----------+----------+-------------------+ FV Prox  Full                                                             +---------+---------------+---------+-----------+----------+-------------------+ FV Mid                  Yes      Yes                  unable to tolerate                                                        compression due to                                                        pain tolerance-                                                           patent by color                                                           doppler             +---------+---------------+---------+-----------+----------+-------------------+ FV Distal               Yes  Yes                  unable to tolerate                                                        compression due to                                                        pain tolerance-                                                           patent by color                                                           doppler             +---------+---------------+---------+-----------+----------+-------------------+ PFV      Full                                                             +---------+---------------+---------+-----------+----------+-------------------+ POP      Full           Yes      Yes                                       +---------+---------------+---------+-----------+----------+-------------------+ PTV      Full                                                             +---------+---------------+---------+-----------+----------+-------------------+ PERO     Full           Yes      Yes                  patent by color                                                           doppler             +---------+---------------+---------+-----------+----------+-------------------+   +---------+---------------+---------+-----------+----------+-------------------+ LEFT     CompressibilityPhasicitySpontaneityPropertiesThrombus Aging      +---------+---------------+---------+-----------+----------+-------------------+ CFV  Full           Yes      Yes                                      +---------+---------------+---------+-----------+----------+-------------------+ SFJ      Full                                                             +---------+---------------+---------+-----------+----------+-------------------+ FV Prox  Full                                                             +---------+---------------+---------+-----------+----------+-------------------+ FV Mid                  Yes      Yes                  unable to tolerate                                                        compression due to                                                        pain tolerance-                                                           patent by color                                                           doppler             +---------+---------------+---------+-----------+----------+-------------------+ FV Distal               Yes      Yes                  unable to tolerate                                                        compression due to  pain tolerance-                                                            patent by color                                                           doppler             +---------+---------------+---------+-----------+----------+-------------------+ PFV      Full                                                             +---------+---------------+---------+-----------+----------+-------------------+ POP      Full           Yes      Yes                                      +---------+---------------+---------+-----------+----------+-------------------+ PTV      Full                                                             +---------+---------------+---------+-----------+----------+-------------------+ PERO     Full                                                             +---------+---------------+---------+-----------+----------+-------------------+     Summary: RIGHT: - There is no evidence of deep vein thrombosis in the lower extremity.  - A cystic structure is found in the popliteal fossa.  LEFT: - There is no evidence of deep vein thrombosis in the lower extremity.  - No cystic structure found in the popliteal fossa.  *See table(s) above for measurements and observations. Electronically signed by Deitra Mayo MD on 02/07/2022 at 6:17:09 PM.    Final    ECHOCARDIOGRAM COMPLETE  Result Date: 02/07/2022    ECHOCARDIOGRAM REPORT   Patient Name:   PHIL CORTI Date of Exam: 02/07/2022 Medical Rec #:  [id number]      Height:       63.0 in Accession #:    [id number]     Weight:       158.7 lb Date of Birth:  Jul 02, 1940       BSA:          1.753 m Patient Age:    62 years       BP:           120/41 mmHg Patient Gender: F  HR:           79 bpm. Exam Location:  Inpatient Procedure: 2D Echo, 3D Echo, Cardiac Doppler and Color Doppler Indications:    Pulmonary Embolus I26.09  History:        Patient has prior history of Echocardiogram examinations, most                 recent  03/10/2019. CAD, Prior CABG, COPD and TIA; Risk                 Factors:Hypertension. History of AAA repair, GERD, GERD, Chronic                 kidney disease. Mesenteric artery stenosis.  Sonographer:    Darlina Sicilian RDCS Referring Phys: [id number] [place]  1. Left ventricular ejection fraction, by estimation, is 65 to 70%. Left ventricular ejection fraction by 3D volume is 68 %. The left ventricle has normal function. The left ventricle has no regional wall motion abnormalities. There is severe asymmetric  left ventricular hypertrophy. Left ventricular diastolic parameters are consistent with Grade II diastolic dysfunction (pseudonormalization).  2. Right ventricular systolic function is normal. The right ventricular size is normal. There is normal pulmonary artery systolic pressure.  3. The mitral valve is normal in structure. Trivial mitral valve regurgitation. No evidence of mitral stenosis.  4. The aortic valve is tricuspid. There is mild thickening of the aortic valve. Aortic valve regurgitation is not visualized. Comparison(s): Prior images reviewed side by side. MR has improved. FINDINGS  Left Ventricle: Left ventricular ejection fraction, by estimation, is 65 to 70%. Left ventricular ejection fraction by 3D volume is 68 %. The left ventricle has normal function. The left ventricle has no regional wall motion abnormalities. The left ventricular internal cavity size was small. There is severe asymmetric left ventricular hypertrophy. Left ventricular diastolic parameters are consistent with Grade II diastolic dysfunction (pseudonormalization). Right Ventricle: The right ventricular size is normal. No increase in right ventricular wall thickness. Right ventricular systolic function is normal. There is normal pulmonary artery systolic pressure. The tricuspid regurgitant velocity is 2.16 m/s, and  with an assumed right atrial pressure of 3 mmHg, the estimated right ventricular systolic  pressure is 10.2 mmHg. Left Atrium: Left atrial size was normal in size. Right Atrium: Right atrial size was normal in size. Pericardium: There is no evidence of pericardial effusion. Presence of epicardial fat layer. Mitral Valve: The mitral valve is normal in structure. Trivial mitral valve regurgitation. No evidence of mitral valve stenosis. Tricuspid Valve: The tricuspid valve is normal in structure. Tricuspid valve regurgitation is mild . No evidence of tricuspid stenosis. Aortic Valve: The aortic valve is tricuspid. There is mild thickening of the aortic valve. Aortic valve regurgitation is not visualized. Pulmonic Valve: The pulmonic valve was normal in structure. Pulmonic valve regurgitation is mild. No evidence of pulmonic stenosis. Aorta: The aortic root and ascending aorta are structurally normal, with no evidence of dilitation. IAS/Shunts: No atrial level shunt detected by color flow Doppler.  LEFT VENTRICLE PLAX 2D LVIDd:         3.60 cm         Diastology LVIDs:         2.30 cm         LV e' medial:    4.05 cm/s LV PW:         1.10 cm         LV E/e' medial:  17.2 LV IVS:  1.50 cm         LV e' lateral:   4.87 cm/s LVOT diam:     2.40 cm         LV E/e' lateral: 14.3 LV SV:         55 LV SV Index:   31 LVOT Area:     4.52 cm        3D Volume EF                                LV 3D EF:    Left                                             ventricul                                             ar                                             ejection                                             fraction                                             by 3D                                             volume is                                             68 %.                                 3D Volume EF:                                3D EF:        68 %                                LV EDV:       96 ml                                LV ESV:       30 ml  LV SV:        66 ml  RIGHT VENTRICLE RV Basal diam:  3.80 cm RV Mid diam:    2.00 cm RV S prime:     12.90 cm/s TAPSE (M-mode): 1.4 cm LEFT ATRIUM             Index        RIGHT ATRIUM           Index LA diam:        4.00 cm 2.28 cm/m   RA Area:     13.60 cm LA Vol (A2C):   55.7 ml 31.78 ml/m  RA Volume:   33.30 ml  19.00 ml/m LA Vol (A4C):   33.5 ml 19.11 ml/m LA Biplane Vol: 43.5 ml 24.82 ml/m  AORTIC VALVE LVOT Vmax:   72.40 cm/s LVOT Vmean:  48.500 cm/s LVOT VTI:    0.122 m  AORTA Ao Root diam: 3.20 cm Ao Asc diam:  2.80 cm MITRAL VALVE               TRICUSPID VALVE MV Area (PHT): 3.43 cm    TR Peak grad:   18.7 mmHg MV Decel Time: 221 msec    TR Vmax:        216.00 cm/s MV E velocity: 69.80 cm/s MV A velocity: 69.40 cm/s  SHUNTS MV E/A ratio:  1.01        Systemic VTI:  0.12 m                            Systemic Diam: 2.40 cm Rudean Haskell MD Electronically signed by Rudean Haskell MD Signature Date/Time: 02/07/2022/1:27:00 PM    Final    CT Angio Chest PE W and/or Wo Contrast  Result Date: 02/06/2022 CLINICAL DATA:  Provided history: Pulmonary embolism (PE) suspected, high prob reduce dose contrast Dural venous and constipation. Recent hip replacement. EXAM: CT ANGIOGRAPHY CHEST WITH CONTRAST TECHNIQUE: Multidetector CT imaging of the chest was performed using the standard protocol during bolus administration of intravenous contrast. Multiplanar CT image reconstructions and MIPs were obtained to evaluate the vascular anatomy. RADIATION DOSE REDUCTION: This exam was performed according to the departmental dose-optimization program which includes automated exposure control, adjustment of the mA and/or kV according to patient size and/or use of iterative reconstruction technique. CONTRAST:  80 cc Omnipaque 350 IV COMPARISON:  Radiograph earlier today. Chest CT 03/17/2021. FINDINGS: Cardiovascular: Isolated subsegmental embolus in the medial right middle lobe pulmonary artery, for example series 9, image 171  and series 10, image 58. No additional pulmonary emboli. Calcified and noncalcified atheromatous plaque throughout the thoracic aorta. No aneurysm or dissection. Post CABG. Upper normal heart size. No pericardial effusion. Mediastinum/Nodes: No enlarged mediastinal or hilar lymph nodes. No visible thyroid nodule. Tiny hiatal hernia. Lungs/Pleura: Small right and trace left pleural effusion. Associated atelectasis. The previous subpleural right lower lobe nodule is obscured on the current exam due to pleural fluid and atelectasis. Postsurgical change in the left upper lobe. There is mild bronchial thickening in the lower lobes. Upper Abdomen: No acute upper abdominal findings. Elongated left lobe of the liver. Musculoskeletal: Stable punctate sclerotic focus within T4 vertebral body likely a bone island. Median sternotomy. No acute osseous findings. Review of the MIP images confirms the above findings. IMPRESSION: 1. Isolated subsegmental embolus in the medial right middle lobe pulmonary artery. 2. Small right and trace left pleural effusion. Associated atelectasis. 3. Mild bronchial thickening in  the lower lobes. Aortic Atherosclerosis (ICD10-I70.0). Critical Value/emergent results were called by telephone at the time of interpretation on 02/06/2022 at 10:25 pm to provider DAVID YAO , who verbally acknowledged these results. Electronically Signed   By: Keith Rake M.D.   On: 02/06/2022 22:26   CT Head Wo Contrast  Result Date: 02/06/2022 CLINICAL DATA:  Mental status change, unknown cause EXAM: CT HEAD WITHOUT CONTRAST TECHNIQUE: Contiguous axial images were obtained from the base of the skull through the vertex without intravenous contrast. RADIATION DOSE REDUCTION: This exam was performed according to the departmental dose-optimization program which includes automated exposure control, adjustment of the mA and/or kV according to patient size and/or use of iterative reconstruction technique. COMPARISON:   02/15/2020 FINDINGS: Brain: No evidence of acute infarction, hemorrhage, mass, mass effect, or midline shift. No hydrocephalus or extra-axial fluid collection. Mildly advanced cerebral atrophy for age. Vascular: No hyperdense vessel. Skull: Normal. Negative for fracture or focal lesion. Sinuses/Orbits: No acute finding. Status post bilateral lens replacements. Other: The mastoid air cells are well aerated. IMPRESSION: No acute intracranial process. Electronically Signed   By: Merilyn Baba M.D.   On: 02/06/2022 14:50   DG Chest 2 View  Result Date: 02/06/2022 CLINICAL DATA:  Cough EXAM: CHEST - 2 VIEW COMPARISON:  Chest x-ray dated Sep 26, 2019 FINDINGS: Cardiac and mediastinal contours are within normal limits. Prior median sternotomy and CABG. Small bilateral pleural effusions and bibasilar atelectasis. No evidence of pneumothorax. IMPRESSION: Small bilateral pleural effusions and bibasilar atelectasis. Electronically Signed   By: Yetta Glassman M.D.   On: 02/06/2022 14:49      LOS: 1 day    Cordelia Poche, MD [facility] 02/08/2022, 11:23 AM   If 7PM-7AM, please contact night-coverage [email or web address]

## 2022-02-08 NOTE — Consult Note (Addendum)
   [facility] CM Inpatient Consult   02/08/2022  KINDEL ROCHEFORT [birth year] [id number]   [facility] [ACO] Patient: Josephine Igo PPO   Primary Care Provider: Cassandria Anger, MD, with [place] [facility], is a provider with a Care Management team and program and is listed for the [facility] follow up needs    Patient screened for less than 7 days readmission with  high risk score for unplanned readmission risk.  Reviewed to assess for potential [place] Management service needs for post hospital transition for readmission prevention needs.  Review of patient's medical record reveals patient is being recommended for a skilled nursing facility level of care notes as patient resides at [facility] and noted husband desires ongoing rehab there per PT progress      Plan:  Continue to follow progress and disposition to assess for post hospital care management needs. Currently, needs are to be met at a skilled nursing level of care for transition, [addendum]- updated -  if insurance approval is needed.  Addendum: 02/10/22 2336: discussion on SNF verses [facility] for post hospital needs.  For questions contact:    Natividad Brood, RN BSN [place] [facility] Liaison  [phone] business mobile phone Toll free office [phone]  Fax number: [phone] [email or web address]

## 2022-02-08 NOTE — TOC Benefit Eligibility Note (Signed)
Patient Teacher, English as a foreign language completed.    The patient is currently admitted and upon discharge could be taking Eliquis 5 mg.  The current 30 day co-pay is $40.00.   The patient is insured through [place], [place] Patient Advocate Specialist [place] Patient Advocate Team Direct Number: [phone]  Fax: [phone]

## 2022-02-08 NOTE — Discharge Instructions (Signed)
Information on my medicine - ELIQUIS (apixaban)  This medication education was reviewed with me or my healthcare representative as part of my discharge preparation.    Why was Eliquis prescribed for you? Eliquis was prescribed to treat blood clots that may have been found in the veins of your legs (deep vein thrombosis) or in your lungs (pulmonary embolism) and to reduce the risk of them occurring again.  What do You need to know about Eliquis ? The starting dose is 10 mg (two 5 mg tablets) taken TWICE daily for the FIRST SEVEN (7) DAYS, then on (enter date)  10/17 PM  the dose is reduced to ONE 5 mg tablet taken TWICE daily.  Eliquis may be taken with or without food.   Try to take the dose about the same time in the morning and in the evening. If you have difficulty swallowing the tablet whole please discuss with your pharmacist how to take the medication safely.  Take Eliquis exactly as prescribed and DO NOT stop taking Eliquis without talking to the doctor who prescribed the medication.  Stopping may increase your risk of developing a new blood clot.  Refill your prescription before you run out.  After discharge, you should have regular check-up appointments with your healthcare provider that is prescribing your Eliquis.    What do you do if you miss a dose? If a dose of ELIQUIS is not taken at the scheduled time, take it as soon as possible on the same day and twice-daily administration should be resumed. The dose should not be doubled to make up for a missed dose.  Important Safety Information A possible side effect of Eliquis is bleeding. You should call your healthcare provider right away if you experience any of the following: Bleeding from an injury or your nose that does not stop. Unusual colored urine (red or dark brown) or unusual colored stools (red or black). Unusual bruising for unknown reasons. A serious fall or if you hit your head (even if there is no  bleeding).  Some medicines may interact with Eliquis and might increase your risk of bleeding or clotting while on Eliquis. To help avoid this, consult your healthcare provider or pharmacist prior to using any new prescription or non-prescription medications, including herbals, vitamins, non-steroidal anti-inflammatory drugs (NSAIDs) and supplements.  This website has more information on Eliquis (apixaban): http://www.eliquis.com/eliquis/home

## 2022-02-09 DIAGNOSIS — E785 Hyperlipidemia, unspecified: Secondary | ICD-10-CM | POA: Diagnosis not present

## 2022-02-09 DIAGNOSIS — I714 Abdominal aortic aneurysm, without rupture, unspecified: Secondary | ICD-10-CM | POA: Diagnosis not present

## 2022-02-09 DIAGNOSIS — N184 Chronic kidney disease, stage 4 (severe): Secondary | ICD-10-CM | POA: Diagnosis not present

## 2022-02-09 DIAGNOSIS — I2699 Other pulmonary embolism without acute cor pulmonale: Secondary | ICD-10-CM | POA: Diagnosis not present

## 2022-02-09 LAB — CBC
HCT: 21.9 % — ABNORMAL LOW (ref 36.0–46.0)
Hemoglobin: 7.5 g/dL — ABNORMAL LOW (ref 12.0–15.0)
MCH: 32.3 pg (ref 26.0–34.0)
MCHC: 34.2 g/dL (ref 30.0–36.0)
MCV: 94.4 fL (ref 80.0–100.0)
Platelets: 404 10*3/uL — ABNORMAL HIGH (ref 150–400)
RBC: 2.32 MIL/uL — ABNORMAL LOW (ref 3.87–5.11)
RDW: 16.2 % — ABNORMAL HIGH (ref 11.5–15.5)
WBC: 9 10*3/uL (ref 4.0–10.5)
nRBC: 0.9 % — ABNORMAL HIGH (ref 0.0–0.2)

## 2022-02-09 MED ORDER — HYDROCORTISONE (PERIANAL) 2.5 % EX CREA
TOPICAL_CREAM | Freq: Two times a day (BID) | CUTANEOUS | Status: DC
  Administered 2022-02-10: 1 via RECTAL
  Filled 2022-02-09: qty 28.35

## 2022-02-09 NOTE — TOC Initial Note (Signed)
Transition of Care [facility]) - Initial/Assessment Note    Patient Details  Name: [name] MRN: [id number] Date of Birth: 1941/01/06  Transition of Care [facility]) CM/SW Contact:    Bethann Berkshire, [place] Phone Number: 02/09/2022, 3:29 PM  Clinical Narrative:                  CSW spoke with Joellen Jersey at [facility] who explained that pt lives at [place] level of care with [facility] at [facility] though went to SNF at their facility for STR on 10/2 up until pt's current admission. Joellen Jersey explains that husband was hoping she could complete more rehab prior to return to [place]. CSW explained that pt is walking 200 and current PT rec is for [facility]. Discussed that pt and husband may need to consider ALF if pt needs more support at home which is offered at [facility]. TOC will follow to assist with disposition.   Expected Discharge Plan: [place] Barriers to Discharge: Continued Medical Work up   Patient Goals and CMS Choice        Expected Discharge Plan and Services Expected Discharge Plan: [place] arrangements for the past 2 months: [place], [place]                                      Prior Living Arrangements/Services Living arrangements for the past 2 months: [place], [place] Lives with:: Spouse Patient language and need for interpreter reviewed:: Yes              Criminal Activity/Legal Involvement Pertinent to Current Situation/Hospitalization: No - Comment as needed  Activities of Daily Living      Permission Sought/Granted                  Emotional Assessment              Admission diagnosis:  Acute pulmonary embolism ([place]) [I26.99] Single subsegmental pulmonary embolism without acute cor pulmonale (HCC) [I26.93] Patient Active Problem List   Diagnosis Date Noted   Tachycardia 02/06/2022   Acute pulmonary embolism ([place])  02/06/2022   Urinary retention 01/30/2022   DVT (deep venous thrombosis) ([place]) 01/30/2022   Vitamin D deficiency 01/30/2022   Slow transit constipation 01/30/2022   GERD (gastroesophageal reflux disease) 01/30/2022   Status post total replacement of right hip 01/29/2022   Status post total replacement of left hip 01/23/2022   Lower GI bleeding 11/18/2021   Hematochezia    Acute blood loss anemia (ABLA)    BRBPR (bright red blood per rectum) 11/17/2021   CKD (chronic kidney disease) stage 4, GFR 15-29 ml/min ([place]) 11/17/2021   Primary osteoarthritis of left hip 10/06/2021   Left hip pain 12/09/2020   Aortic atherosclerosis ([place]) 09/07/2020   Gait disorder 06/08/2020   Neoplasm of uncertain behavior of skin 03/14/2020   Contusion of face 03/10/2020   Arthralgia 12/25/2019   Ankle swelling 08/08/2019   Hyperkalemia 05/15/2019   Osteoporosis 02/26/2018   Colon polyp 06/28/2016   COPD GOLD I 05/28/2016   Upper airway cough syndrome 05/22/2016   Wrist pain, right 12/14/2015   Lung mass 03/02/2015   Pre-operative cardiovascular examination 02/21/2015   DOE (dyspnea on exertion) 02/16/2015   TIA (transient ischemic attack) 12/31/2014   Abnormal mental state  12/30/2014   Diarrhea 11/26/2014   Well adult exam 11/26/2014   Seasonal allergies 12/31/2013   Splenic artery aneurysm ([place]) 04/07/2013   Cerumen impaction 03/20/2013   Dyslipidemia, goal LDL below 70    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   Long term (current) use of anticoagulants 11/08/2010   Rheumatoid arthritis ([place]) 06/23/2010   Pain in joint 11/26/2009   Hypothyroidism 12/08/2006   Macular degeneration (senile) of retina 12/08/2006   Essential hypertension 12/08/2006   Diaphragmatic hernia 12/08/2006   ASPLENIA 12/08/2006   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Phone: [phone]: [phone]    Social Determinants of Health (SDOH) Interventions    Readmission Risk Interventions     No data to display

## 2022-02-09 NOTE — Progress Notes (Signed)
Physical Therapy Treatment Patient Details Name: [name] MRN: [id number] DOB: Oct 17, 1940 Today's Date: 02/09/2022   History of Present Illness [name] is a 81 y.o. female who [facility] due to increasing shortness of breath, fatigue and drowsiness. CT revealed PE.    PMHx: L THA 01/23/22, R DVT aortic aneurysm, CAD, previous CABG X 1, chronic kidney disease, COPD, cervical DDD, hyperlipidemia, HTN, hypothyroid, rheumatoid arthritis, previous MI and stroke, shortness of breath, previous R bunionectomy, L carpal tunnel release, R foot bunionectomy, cervical spine surgery, multiple abdominal surgeries, osteoporosis    PT Comments    Pt tolerates treatment well, ambulating for household distances without physical assistance. Pt utilizes RW during session, reporting the rollator stocked on 3E is not supportive enough and rolls too quickly. Pt feels her own rollator is more supportive. Pt will benefit from continued frequent mobilization in an effort to improve stability and endurance. PT updates recommendations to HHPT with intermittent assistance from spouse/family as needed for ADLs.   Recommendations for follow up therapy are one component of a multi-disciplinary discharge planning process, led by the attending physician.  Recommendations may be updated based on patient status, additional functional criteria and insurance authorization.  Follow Up Recommendations  Home health PT ([facility] to [facility] [place]) Can patient physically be transported by private vehicle: Yes   Assistance Recommended at Discharge Intermittent Supervision/Assistance  Patient can return home with the following A little help with bathing/dressing/bathroom;Assistance with cooking/housework;Assist for transportation;Help with stairs or ramp for entrance   Equipment Recommendations  Rolling walker (2 wheels)    Recommendations for Other Services       Precautions / Restrictions  Precautions Precautions: Fall Restrictions Weight Bearing Restrictions: No     Mobility  Bed Mobility Overal bed mobility: Modified Independent Bed Mobility: Supine to Sit     Supine to sit: Modified independent (Device/Increase time)     General bed mobility comments: increased time    Transfers Overall transfer level: Needs assistance Equipment used: Rolling walker (2 wheels) Transfers: Sit to/from Stand Sit to Stand: Supervision                Ambulation/Gait Ambulation/Gait assistance: Supervision Gait Distance (Feet): 200 Feet Assistive device: Rolling walker (2 wheels) Gait Pattern/deviations: Step-through pattern Gait velocity: reduced Gait velocity interpretation: 1.31 - 2.62 ft/sec, indicative of limited community ambulator   General Gait Details: steady step-through gait, mild increase in trunk flexion   Stairs             Wheelchair Mobility    Modified Rankin (Stroke Patients Only)       Balance Overall balance assessment: Needs assistance Sitting-balance support: No upper extremity supported, Feet supported Sitting balance-Leahy Scale: Good     Standing balance support: Single extremity supported, Bilateral upper extremity supported, Reliant on assistive device for balance Standing balance-Leahy Scale: Poor                              Cognition Arousal/Alertness: Awake/alert Behavior During Therapy: WFL for tasks assessed/performed, Flat affect Overall Cognitive Status: Within Functional Limits for tasks assessed                                 General Comments: WFL for purposes of PT session, following commands well, alert        Exercises  General Comments General comments (skin integrity, edema, etc.): HR in 100s at rest, tachy into 120s for majority of ambulation, tachy to 136 max briefly      Pertinent Vitals/Pain Pain Assessment Pain Assessment: Faces Faces Pain Scale: Hurts a  little bit Pain Location: L hip Pain Descriptors / Indicators: Grimacing Pain Intervention(s): Monitored during session    Home Living                          Prior Function            PT Goals (current goals can now be found in the care plan section) Acute Rehab PT Goals Patient Stated Goal: to go home Progress towards PT goals: Progressing toward goals    Frequency    Min 4X/week      PT Plan Current plan remains appropriate    Co-evaluation              AM-PAC PT "6 Clicks" Mobility   Outcome Measure  Help needed turning from your back to your side while in a flat bed without using bedrails?: None Help needed moving from lying on your back to sitting on the side of a flat bed without using bedrails?: None Help needed moving to and from a bed to a chair (including a wheelchair)?: A Little Help needed standing up from a chair using your arms (e.g., wheelchair or bedside chair)?: A Little Help needed to walk in hospital room?: A Little Help needed climbing 3-5 steps with a railing? : A Lot 6 Click Score: 19    End of Session   Activity Tolerance: Patient tolerated treatment well Patient left: in bed;with call bell/phone within reach Nurse Communication: Mobility status PT Visit Diagnosis: Unsteadiness on feet (R26.81);Other abnormalities of gait and mobility (R26.89);Muscle weakness (generalized) (M62.81);Difficulty in walking, not elsewhere classified (R26.2);Pain Pain - Right/Left: Left Pain - part of body: Hip     Time: 7530-0511 PT Time Calculation (min) (ACUTE ONLY): 13 min  Charges:  $Gait Training: 8-22 mins                     Zenaida Niece, PT, DPT Acute Rehabilitation Office [phone]    Zenaida Niece 02/09/2022, 10:09 AM

## 2022-02-09 NOTE — Progress Notes (Deleted)
Family is wanting patient to go to SNF part of the [facility]. NCM notified MD and CSW.

## 2022-02-09 NOTE — TOC Transition Note (Addendum)
Transition of Care [facility]) - CM/SW Discharge Note   Patient Details  Name: [name] MRN: [id number] Date of Birth: 1940/12/18  Transition of Care [facility]) CM/SW Contact:  Zenon Mayo, RN Phone Number: 02/09/2022, 4:34 PM   Clinical Narrative:    NCM spoke with patient at the bedside with son and DIL, NCM offered choice for HHRN,HHPT, [place], [place], from Medicare .gov list , they chose [facility].  NCM made refereal to [facility] with Alvis Lemmings, he is able to take referral.  Soc will begin 24 to 48 hrs post dc.  Son Quita Skye will transport her home if she is dc on Friday or Saturday morning, his phone is [phone]. Patient also would like to have a light weight wheelchair, if walking 200 feet will not qualify for w/chair.   She has a shower chair at home.  TOC following. Per   Final next level of care: [place] Barriers to Discharge: Continued Medical Work up   Patient Goals and CMS Choice Patient states their goals for this hospitalization and ongoing recovery are:: return home CMS Medicare.gov Compare Post Acute Care list provided to:: Patient Represenative (must comment) Choice offered to / list presented to : Adult Children  Discharge Placement                       Discharge Plan and Services                DME Arranged: Lightweight manual wheelchair with seat cushion DME Agency: [employer] Date DME Agency Contacted: 02/09/22 Time DME Agency Contacted: 1628 Representative spoke with at DME Agency: Jodell Cipro HH Arranged: RN, PT, OT, Nurse's Aide [place] Agency: [place] Date [place]: 02/09/22 Time [place]: [phone] Representative spoke with at [place]: Tommi Rumps  Social Determinants of Health ([place]) Interventions     Readmission Risk Interventions     No data to display

## 2022-02-09 NOTE — Progress Notes (Signed)
PROGRESS NOTE    [name]  DZH:[id number] DOB: 1940-06-18 DOA: 02/06/2022 PCP: Cassandria Anger, MD   Brief Narrative: [name] is a 81 y.o. female with a history of hypertension, rheumatoid arthritis, macular degeneration, history of splenectomy, mesenteric artery stenosis, CAD s/p CABG, CKD stage IV, hypothyroidism, TIA, COPD, DVT, GERD. Patient presented secondary to increased drowsiness, fatigue and shortness of breath and was found to have evidence of acute PE while on Lovenox prophylaxis. Heparin IV initiated and patient is now transitioned to Eliquis.   Assessment and Plan:  Acute PE Provoked in setting of recent total left hip arthroplasty. Associated recent age indeterminate right lower extremity DVT which is not identifiable on recent LE venous duplex. CTA chest with subsegmental PE in the right middle lobe and no associated heart strain. Patient managed on Heparin IV initially. Transthoracic Echocardiogram without evidence of RV dysfunction. Heparin IV transitioned to Eliquis. -Continue Eliquis PO  Acute respiratory failure with hypoxia Secondary to acute PE. Weaned to room air. -Ambulatory pulse ox prior to discharge.  Leukocytosis Mildly elevated. Trended down slightly.  Urinary retention Diagnosed from previous admission and currently has an indwelling foley in place. -Continue foley catheter and follow-up with urology as an outpatient.  History of left THA Patient was previously at [facility].  CKD stage IV Creatinine is stable.  Normocytic anemia Hemoglobin trending down. No obvious evidence of bleeding. Complicated by heparin IV/Eliquis and Plavix. Hemoglobin of 7.5 this morning. -Repeat CBC in AM  Rheumatoid arthritis Patient is on golimumab and leflunomide as an outpatient. Golimumab held by orthopedic surgery with recommendations to resume in 2 weeks. -Continue leflunomide  CAD Noted. History of CABG. -Continue metoprolol and  Lipitor  Hypothyroidism -Continue Synthroid 88 mcg daily  History of TIA Noted. -Continue Lipitor  GERD -Continue Protonix  History of abdominal aortic aneurysm S/p repair.  Mesenteric artery stenosis -Continue Lipitor and Plavix  DVT prophylaxis: Eliquis Code Status:   Code Status: Full Code Family Communication: None at bedside. Called daughter in law while at bedside but no answer Disposition Plan: Discharge ILF with home health PT. Discharge in 24 hours if hemoglobin is stable   Consultants:  None  Procedures:  None  Antimicrobials: Vancomycin Cefepime Cefadroxil    Subjective: Patient reports no issues overnight. No chest pain or dyspnea.  Objective: BP (!) 149/43 (BP Location: Right Arm)   Pulse 96   Temp 98.2 F (36.8 C) (Oral)   Resp 20   Ht '5\' 3"'$  (1.6 m)   Wt 71.7 kg   SpO2 94%   BMI 28.01 kg/m   Examination:  General exam: Appears calm and comfortable Respiratory system: Respiratory effort normal. Gastrointestinal system: Abdomen is distended. Central nervous system: Alert and oriented. Musculoskeletal: BLE edema. Skin: No cyanosis. No rashes Psychiatry: Judgement and insight appear normal. Mood & affect appropriate.     Data Reviewed: I have personally reviewed following labs and imaging studies  CBC Lab Results  Component Value Date   WBC 9.0 02/09/2022   RBC 2.32 (L) 02/09/2022   HGB 7.5 (L) 02/09/2022   HCT 21.9 (L) 02/09/2022   MCV 94.4 02/09/2022   MCH 32.3 02/09/2022   PLT 404 (H) 02/09/2022   MCHC 34.2 02/09/2022   RDW 16.2 (H) 02/09/2022   LYMPHSABS 2.7 01/03/2022   MONOABS 1.3 (H) 01/03/2022   EOSABS 0.5 01/03/2022   BASOSABS 0.2 (H) 41/96/2229     Last metabolic panel Lab Results  Component Value Date   NA  134 (L) 02/08/2022   K 3.8 02/08/2022   CL 100 02/08/2022   CO2 20 (L) 02/08/2022   BUN 35 (H) 02/08/2022   CREATININE 1.91 (H) 02/08/2022   GLUCOSE 117 (H) 02/08/2022   GFRNONAA 26 (L) 02/08/2022    GFRAA 24 (L) 12/26/2019   CALCIUM 8.7 (L) 02/08/2022   PROT 5.2 (L) 02/07/2022   ALBUMIN 2.1 (L) 02/07/2022   BILITOT 0.7 02/07/2022   ALKPHOS 110 02/07/2022   AST 48 (H) 02/07/2022   ALT 29 02/07/2022   ANIONGAP 14 02/08/2022    GFR: Estimated Creatinine Clearance: 21.2 mL/min (A) (by C-G formula based on SCr of 1.91 mg/dL (H)).  Recent Results (from the past 240 hour(s))  Culture, Urine (Do not remove urinary catheter, catheter placed by urology or difficult to place)     Status: Abnormal   Collection Time: 02/06/22  6:00 PM   Specimen: Urine, Catheterized  Result Value Ref Range Status   Specimen Description URINE, CATHETERIZED  Final   Special Requests   Final    NONE Performed at [facility], [place] [address] [place], [place] [address]    Culture 80,000 COLONIES/mL YEAST (A)  Final   Report Status 02/08/2022 FINAL  Final  SARS Coronavirus 2 by RT PCR (hospital order, performed in [facility] hospital lab) *cepheid single result test* Anterior Nasal Swab     Status: None   Collection Time: 02/06/22  6:18 PM   Specimen: Anterior Nasal Swab  Result Value Ref Range Status   SARS Coronavirus 2 by RT PCR NEGATIVE NEGATIVE Final    Comment: (NOTE) SARS-CoV-2 target nucleic acids are NOT DETECTED.  The SARS-CoV-2 RNA is generally detectable in upper and lower respiratory specimens during the acute phase of infection. The lowest concentration of SARS-CoV-2 viral copies this assay can detect is 250 copies / mL. A negative result does not preclude SARS-CoV-2 infection and should not be used as the sole basis for treatment or other patient management decisions.  A negative result may occur with improper specimen collection / handling, submission of specimen other than nasopharyngeal swab, presence of viral mutation(s) within the areas targeted by this assay, and inadequate number of viral copies (<250 copies / mL). A negative result must be combined with  clinical observations, patient history, and epidemiological information.  Fact Sheet for Patients:   https://www.patel.info/  Fact Sheet for Healthcare Providers: https://hall.com/  This test is not yet approved or  cleared by the [place] FDA and has been authorized for detection and/or diagnosis of SARS-CoV-2 by FDA under an Emergency Use Authorization (EUA).  This EUA will remain in effect (meaning this test can be used) for the duration of the COVID-19 declaration under Section 564(b)(1) of the Act, 21 U.S.C. section 360bbb-3(b)(1), unless the authorization is terminated or revoked sooner.  Performed at [facility], [place] [address] [place], [place] [address]       Radiology Studies: No results found.    LOS: 2 days    Cordelia Poche, MD [facility] 02/09/2022, 2:47 PM   If 7PM-7AM, please contact night-coverage [email or web address]

## 2022-02-10 DIAGNOSIS — E785 Hyperlipidemia, unspecified: Secondary | ICD-10-CM | POA: Diagnosis not present

## 2022-02-10 DIAGNOSIS — I714 Abdominal aortic aneurysm, without rupture, unspecified: Secondary | ICD-10-CM | POA: Diagnosis not present

## 2022-02-10 DIAGNOSIS — I2699 Other pulmonary embolism without acute cor pulmonale: Secondary | ICD-10-CM | POA: Diagnosis not present

## 2022-02-10 DIAGNOSIS — N184 Chronic kidney disease, stage 4 (severe): Secondary | ICD-10-CM | POA: Diagnosis not present

## 2022-02-10 LAB — CBC
HCT: 22.8 % — ABNORMAL LOW (ref 36.0–46.0)
Hemoglobin: 7.8 g/dL — ABNORMAL LOW (ref 12.0–15.0)
MCH: 32.5 pg (ref 26.0–34.0)
MCHC: 34.2 g/dL (ref 30.0–36.0)
MCV: 95 fL (ref 80.0–100.0)
Platelets: 444 10*3/uL — ABNORMAL HIGH (ref 150–400)
RBC: 2.4 MIL/uL — ABNORMAL LOW (ref 3.87–5.11)
RDW: 16 % — ABNORMAL HIGH (ref 11.5–15.5)
WBC: 9.1 10*3/uL (ref 4.0–10.5)
nRBC: 0.9 % — ABNORMAL HIGH (ref 0.0–0.2)

## 2022-02-10 MED ORDER — FLUCONAZOLE 150 MG PO TABS
150.0000 mg | ORAL_TABLET | Freq: Once | ORAL | Status: AC
  Administered 2022-02-10: 150 mg via ORAL
  Filled 2022-02-10 (×2): qty 1

## 2022-02-10 NOTE — Progress Notes (Signed)
Occupational Therapy Treatment Patient Details Name: [name] MRN: [id number] DOB: 1941/02/02 Today's Date: 02/10/2022   History of present illness [name] is a 81 y.o. female who [facility] to increasing shortness of breath, fatigue and drowsiness. CT revealed PE.    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 Seen for skilled OT session.  Pt.s family present and also MD towards end of session. Pt. Required Mod A for sit/stand from various surfaces today.  Also requesting seated grooming task with reported fatigue.  Required rest break after toileting task after brief ambulation to sink.  Agree with previously recommended level of A at d/c for frequent of constant S/assistance.  Originally stated for [facility] but pt. May benefit from continued therapies prior to apt. With her spouse to ensure safety with mobility and adls.  Will alert OTR/L of this.     Recommendations for follow up therapy are one component of a multi-disciplinary discharge planning process, led by the attending physician.  Recommendations may be updated based on patient status, additional functional criteria and insurance authorization.    Follow Up Recommendations       Assistance Recommended at Discharge Frequent or constant Supervision/Assistance  Patient can return home with the following  A little help with walking and/or transfers;A lot of help with bathing/dressing/bathroom;Assistance with cooking/housework;Help with stairs or ramp for entrance;Assist for transportation   Equipment Recommendations  BSC/3in1;Wheelchair (measurements OT);Wheelchair cushion  (measurements OT)    Recommendations for Other Services      Precautions / Restrictions Precautions Precautions: Fall Restrictions Weight Bearing Restrictions: No LLE Weight Bearing: Weight bearing as tolerated       Mobility Bed Mobility               General bed mobility comments: in b.room upon arrival but family reports she has difficulty in/out of bed    Transfers Overall transfer level: Needs assistance Equipment used: Rolling walker (2 wheels) Transfers: Sit to/from Stand, Bed to chair/wheelchair/BSC Sit to Stand: Mod assist           General transfer comment: instructions for hand placement to set up well-heavy assist even with use of grab bars in b.room, also seated in chair at sink had to pull on counter to bring into standing     Balance                                           ADL either performed or assessed with clinical judgement   ADL Overall ADL's : Needs assistance/impaired     Grooming: Set up;Sitting;Oral care Grooming Details (indicate cue type and reason): pt. requested to sit for task secondary to reported fatigue from use of b.room                 Toilet Transfer: Minimal assistance;Ambulation;Rolling walker (2 wheels)   Toileting- Clothing Manipulation and Hygiene: Min guard;Sit to/from stand Toileting - Clothing Manipulation Details (indicate cue type and reason): pt. bending forward to clean buttocks after BM, attempted education on safer tech. then bending forward to reach she said "i never learned  how to do it that way but i dont drag it through"     Functional mobility during ADLs: Minimal assistance;Rolling walker (2 wheels) General ADL Comments: assistance for cues for rw management and hand placement for safer sit/stand, stand/sit    Extremity/Trunk Assessment              Vision       Perception     Praxis      Cognition Arousal/Alertness: Awake/alert Behavior During Therapy: WFL for  tasks assessed/performed, Flat affect Overall Cognitive Status: Within Functional Limits for tasks assessed                                          Exercises      Shoulder Instructions       General Comments      Pertinent Vitals/ Pain       Pain Assessment Pain Assessment: No/denies pain  Home Living                                          Prior Functioning/Environment              Frequency  Min 2X/week        Progress Toward Goals  OT Goals(current goals can now be found in the care plan section)  Progress towards OT goals: Progressing toward goals     Plan Discharge plan needs to be updated;Discharge plan remains appropriate    Co-evaluation                 AM-PAC OT "6 Clicks" Daily Activity     Outcome Measure   Help from another person eating meals?: None Help from another person taking care of personal grooming?: A Little Help from another person toileting, which includes using toliet, bedpan, or urinal?: A Little Help from another person bathing (including washing, rinsing, drying)?: A Lot Help from another person to put on and taking off regular upper body clothing?: A Little Help from another person to put on and taking off regular lower body clothing?: A Lot 6 Click Score: 17    End of Session Equipment Utilized During Treatment: Rolling walker (2 wheels)  OT Visit Diagnosis: Unsteadiness on feet (R26.81);Other abnormalities of gait and mobility (R26.89);Pain;Other symptoms and signs involving cognitive function Pain - Right/Left: Left Pain - part of body: Hip   Activity Tolerance Patient tolerated treatment well   Patient Left in chair;with call bell/phone within reach;with family/visitor present;Other (comment) (MD in room with the family)   Nurse Communication Other (comment) (communicated with SW as family requesting update on if he had [facility])        Time:  4696-2952 OT Time Calculation (min): 23 min  Charges: OT General Charges $OT Visit: 1 Visit OT Treatments $Self Care/Home Management : 23-37 mins  Sonia Baller, COTA/L Acute Rehabilitation [phone]   Tanya Nones 02/10/2022, 12:03 PM

## 2022-02-10 NOTE — Progress Notes (Signed)
The RN reports that apparently pt had a sleep apnea episode last night and the night shift RN placed pt on oxygen last night.  The family was questioning if pt could have sleep apnea as pt states she does not have a known history of sleep apnea. Attending MD was notified of this.  No new RT orders received.

## 2022-02-10 NOTE — Progress Notes (Addendum)
Physical Therapy Treatment Patient Details Name: [name] MRN: [id number] DOB: March 20, 1941 Today's Date: 02/10/2022   History of Present Illness [name] is a 81 y.o. female who [facility] to increasing shortness of breath, fatigue and drowsiness. CT revealed PE.    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  Pt is now having declined sats on room air to walk, which will mean her SOB will require skilled intervention to monitor sats and need for O2.  Pt is motivated to work, and note her sats were consistent with more than one walking attempt.  Will follow up with PT in acute setting to keep a handle on her mobility, to monitor her sats and to observe her safety with new O2 use. Husband is in attendance and in agreement for all her recommendations.  Recommendations for follow up therapy are one component of a multi-disciplinary discharge planning process, led by the attending physician.  Recommendations may be updated based on patient status, additional functional criteria and insurance authorization.  Follow Up Recommendations  Skilled nursing-short term rehab (<3 hours/day) Can patient physically be transported by private vehicle: Yes   Assistance Recommended at Discharge Intermittent Supervision/Assistance  Patient can return home with the following A little help with walking and/or transfers;A little help with bathing/dressing/bathroom;Assistance with cooking/housework;Assist for transportation;Help with stairs or ramp for entrance   Equipment Recommendations   Rolling walker (2 wheels)    Recommendations for Other Services       Precautions / Restrictions Precautions Precautions: Fall Restrictions Weight Bearing Restrictions: No LLE Weight Bearing: Weight bearing as tolerated     Mobility  Bed Mobility               General bed mobility comments: up to bathroom    Transfers Overall transfer level: Needs assistance Equipment used: Rolling walker (2 wheels) Transfers: Sit to/from Stand Sit to Stand: Min assist           General transfer comment: required no instructions for hand placement    Ambulation/Gait Ambulation/Gait assistance: Min guard Gait Distance (Feet): 45 Feet Assistive device: Rolling walker (2 wheels) Gait Pattern/deviations: Step-through pattern, Wide base of support Gait velocity: reduced Gait velocity interpretation: <1.31 ft/sec, indicative of household ambulator   General Gait Details: step through with SOB during esp return from BR; note 81% from room air walk twice with gait but recovers on room air to 91%   Stairs             Wheelchair Mobility    Modified Rankin (Stroke Patients Only)       Balance Overall balance assessment: Needs assistance Sitting-balance support: Feet supported Sitting balance-Leahy Scale: Good     Standing balance support: Bilateral upper extremity supported, During functional activity Standing balance-Leahy Scale: Poor Standing balance comment: requires walker support and is desaturating with standing and gait                            Cognition Arousal/Alertness: Awake/alert Behavior During Therapy: WFL for tasks assessed/performed, Flat affect Overall Cognitive Status: Within  Functional Limits for tasks assessed                                          Exercises Total Joint Exercises Ankle Circles/Pumps: AROM, 5 reps Quad Sets: AROM, 10 reps Gluteal Sets: AROM, 10 reps    General Comments General comments  (skin integrity, edema, etc.): Pt is up to walk with help and monitored her sats, SOB is accompanied by drop to 81% but also noted with release of grip on the finger probe      Pertinent Vitals/Pain Pain Assessment Pain Assessment: No/denies pain    Home Living                          Prior Function            PT Goals (current goals can now be found in the care plan section) Acute Rehab PT Goals Patient Stated Goal: to go home    Frequency    Min 3X/week      PT Plan Discharge plan needs to be updated;Frequency needs to be updated    Co-evaluation              AM-PAC PT "6 Clicks" Mobility   Outcome Measure  Help needed turning from your back to your side while in a flat bed without using bedrails?: A Little Help needed moving from lying on your back to sitting on the side of a flat bed without using bedrails?: A Little Help needed moving to and from a bed to a chair (including a wheelchair)?: A Little Help needed standing up from a chair using your arms (e.g., wheelchair or bedside chair)?: A Little Help needed to walk in hospital room?: A Little Help needed climbing 3-5 steps with a railing? : A Lot 6 Click Score: 17    End of Session Equipment Utilized During Treatment: Gait belt Activity Tolerance: Treatment limited secondary to medical complications (Comment) Patient left: in chair;with call bell/phone within reach;with family/visitor present Nurse Communication: Mobility status PT Visit Diagnosis: Unsteadiness on feet (R26.81);Other abnormalities of gait and mobility (R26.89);Muscle weakness (generalized) (M62.81);Difficulty in walking, not elsewhere classified (R26.2);Pain     Time: 1351-1416 PT Time Calculation (min) (ACUTE ONLY): 25 min  Charges:  $Gait Training: 8-22 mins $Therapeutic Exercise: 8-22 mins       Ramond Dial 02/10/2022, 2:38 PM  Mee Hives, PT PhD Acute Rehab Dept. Number: [place] and [place]

## 2022-02-10 NOTE — Progress Notes (Signed)
   02/10/22 1150  Mobility  Activity Ambulated with assistance in room  Level of Assistance Contact guard assist, steadying assist  Assistive Device Front wheel walker  Distance Ambulated (ft) 10 ft  Activity Response Tolerated fair  Mobility Referral Yes  $Mobility charge 1 Mobility   Mobility Specialist Progress Note  Pre-Mobility:  98% SpO2(RA) During Mobility:  81-95% SpO2 (2L/min O2) Post-Mobility: 92% SpO2(RA)  Pt was in chair and agreeable. Mobility cut short d/t having SOB during ambulation. Returned to chair w/ all needs met and call bell in reach. RN notified.   Lucious Groves Mobility Specialist

## 2022-02-10 NOTE — NC FL2 (Signed)
[place] LEVEL OF CARE SCREENING TOOL     IDENTIFICATION  Patient Name: [name] Birthdate: 07-08-1940 Sex: female Admission Date (Current Location): 02/06/2022  [facility] [place] and [place] Number:  Herbalist and Address:  [facility] [place], [place] [address], [place], [place] [address]      Provider Number: [id number]  Attending Physician Name and Address:  Mariel Aloe, MD  Relative Name and Phone Number:       Current Level of Care: Hospital Recommended Level of Care: [place] Prior Approval Number:    Date Approved/Denied:   PASRR Number: [id number]  Discharge Plan: SNF    Current Diagnoses: Patient Active Problem List   Diagnosis Date Noted   Tachycardia 02/06/2022   Acute pulmonary embolism ([place]) 02/06/2022   Urinary retention 01/30/2022   DVT (deep venous thrombosis) ([place]) 01/30/2022   Vitamin D deficiency 01/30/2022   Slow transit constipation 01/30/2022   GERD (gastroesophageal reflux disease) 01/30/2022   Status post total replacement of right hip 01/29/2022   Status post total replacement of left hip 01/23/2022   Lower GI bleeding 11/18/2021   Hematochezia    Acute blood loss anemia (ABLA)    BRBPR (bright red blood per rectum) 11/17/2021   CKD (chronic kidney disease) stage 4, GFR 15-29 ml/min (HCC) 11/17/2021   Primary osteoarthritis of left hip 10/06/2021   Left hip pain 12/09/2020   Aortic atherosclerosis ([place]) 09/07/2020   Gait disorder 06/08/2020   Neoplasm of uncertain behavior of skin 03/14/2020   Contusion of face 03/10/2020   Arthralgia 12/25/2019   Ankle swelling 08/08/2019   Hyperkalemia 05/15/2019   Osteoporosis 02/26/2018   Colon polyp 06/28/2016   COPD GOLD I 05/28/2016   Upper airway cough syndrome 05/22/2016   Wrist pain, right 12/14/2015   Lung mass 03/02/2015   Pre-operative cardiovascular examination 02/21/2015   DOE (dyspnea on exertion) 02/16/2015   TIA (transient  ischemic attack) 12/31/2014   Abnormal mental state 12/30/2014   Diarrhea 11/26/2014   Well adult exam 11/26/2014   Seasonal allergies 12/31/2013   Splenic artery aneurysm ([place]) 04/07/2013   Cerumen impaction 03/20/2013   Dyslipidemia, goal LDL below 70    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   Long term (current) use of anticoagulants 11/08/2010   Rheumatoid arthritis ([place]) 06/23/2010   Pain in joint 11/26/2009   Hypothyroidism 12/08/2006   Macular degeneration (senile) of retina 12/08/2006   Essential hypertension 12/08/2006   Diaphragmatic hernia 12/08/2006   ASPLENIA 12/08/2006   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) 12/22/1994   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; now atretic and significantly improved LAD lesion without intervention 12/22/1994    Orientation RESPIRATION BLADDER Height & Weight     Self, Time, Situation  Normal Indwelling catheter Weight: 158 lb 1.6 oz (71.7 kg) Height:  '5\' 3"'$  (160 cm)  BEHAVIORAL SYMPTOMS/MOOD NEUROLOGICAL BOWEL NUTRITION STATUS      Continent Diet (See d/c summary)  AMBULATORY STATUS COMMUNICATION OF NEEDS Skin   Extensive Assist Verbally Surgical wounds (Incision hip)                       Personal Care Assistance Level of Assistance  Feeding, Bathing, Dressing Bathing Assistance: Limited assistance Feeding assistance: Independent Dressing Assistance: Limited assistance     Functional Limitations Info  Sight, Hearing, Speech Sight Info: Impaired Hearing Info: Adequate  Speech Info: Adequate    SPECIAL CARE FACTORS FREQUENCY  PT (By licensed PT), OT (By licensed OT)     PT Frequency: 5x/week OT Frequency: 5x/week            Contractures Contractures Info: Not present    Additional Factors Info  Code Status, Allergies Code Status Info: Full code Allergies Info: NSAIDS/ Aspirin/ Codeine/ Nitroglycerin           Current Medications  (02/10/2022):  This is the current hospital active medication list Current Facility-Administered Medications  Medication Dose Route Frequency Provider Last Rate Last Admin   acetaminophen (TYLENOL) tablet 650 mg  650 mg Oral Q6H PRN Marcelyn Bruins, MD       Or   acetaminophen (TYLENOL) suppository 650 mg  650 mg Rectal Q6H PRN Marcelyn Bruins, MD       albuterol (PROVENTIL) (2.5 MG/3ML) 0.083% nebulizer solution 3 mL  3 mL Inhalation Q4H PRN Marcelyn Bruins, MD   3 mL at 02/08/22 0402   amLODipine (NORVASC) tablet 2.5 mg  2.5 mg Oral Daily Marcelyn Bruins, MD   2.5 mg at 02/10/22 0840   apixaban (ELIQUIS) tablet 10 mg  10 mg Oral BID Shelly Coss, MD   10 mg at 02/10/22 0840   Followed by   Derrill Memo ON 02/14/2022] apixaban (ELIQUIS) tablet 5 mg  5 mg Oral BID Shelly Coss, MD       arformoterol (BROVANA) nebulizer solution 15 mcg  15 mcg Nebulization BID Marcelyn Bruins, MD   15 mcg at 02/10/22 0855   And   umeclidinium bromide (INCRUSE ELLIPTA) 62.5 MCG/ACT 1 puff  1 puff Inhalation Daily Marcelyn Bruins, MD   1 puff at 02/10/22 0904   atorvastatin (LIPITOR) tablet 80 mg  80 mg Oral Daily Marcelyn Bruins, MD   80 mg at 02/10/22 0840   cefadroxil (DURICEF) capsule 500 mg  500 mg Oral BID Leandrew Koyanagi, MD   500 mg at 02/10/22 0840   Chlorhexidine Gluconate Cloth 2 % PADS 6 each  6 each Topical Daily Mariel Aloe, MD   6 each at 02/10/22 0846   famotidine (PEPCID) tablet 20 mg  20 mg Oral Daily Marcelyn Bruins, MD   20 mg at 02/10/22 0840   gabapentin (NEURONTIN) capsule 300 mg  300 mg Oral TID Marcelyn Bruins, MD   300 mg at 02/10/22 0840   hydrochlorothiazide (HYDRODIURIL) tablet 12.5 mg  12.5 mg Oral Daily Marcelyn Bruins, MD   12.5 mg at 02/10/22 0840   HYDROcodone bit-homatropine (HYCODAN) 5-1.5 MG/5ML syrup 5 mL  5 mL Oral Q4H PRN Mariel Aloe, MD       hydrocortisone (ANUSOL-HC) 2.5 % rectal cream   Rectal BID Mariel Aloe, MD   1  Application at 25/95/63 0843   leflunomide (ARAVA) tablet 20 mg  20 mg Oral Daily Marcelyn Bruins, MD   20 mg at 02/10/22 1136   levothyroxine (SYNTHROID) tablet 88 mcg  88 mcg Oral QAC breakfast Marcelyn Bruins, MD   88 mcg at 02/10/22 0618   metoprolol tartrate (LOPRESSOR) tablet 50 mg  50 mg Oral BID Marcelyn Bruins, MD   50 mg at 02/10/22 0840   pantoprazole (PROTONIX) EC tablet 40 mg  40 mg Oral Daily Marcelyn Bruins, MD   40 mg at 02/10/22 0840   polyethylene glycol (MIRALAX / GLYCOLAX) packet 17 g  17 g Oral Daily PRN Trilby Drummer,  Candace Gallus, MD       sodium chloride flush (NS) 0.9 % injection 3 mL  3 mL Intravenous Q12H Marcelyn Bruins, MD   3 mL at 02/10/22 0843   tamsulosin (FLOMAX) capsule 0.4 mg  0.4 mg Oral Daily Shelly Coss, MD   0.4 mg at 02/10/22 0840     Discharge Medications: Please see discharge summary for a list of discharge medications.  Relevant Imaging Results:  Relevant Lab Results:   Additional Information SS#: [id number]  Bethann Berkshire, LCSW

## 2022-02-10 NOTE — TOC Progression Note (Addendum)
Transition of Care [facility]) - Progression Note    Patient Details  Name: [name] MRN: [id number] Date of Birth: August 21, 1940  Transition of Care [facility]) CM/SW [place], [place] Phone Number: 02/10/2022, 2:55 PM  Clinical Narrative:     Recent therapy note recommending SNF. CSW initiated insurance auth in [place] portal; status pending. Left message updating [facility] with [facility]. Josem Kaufmann is requested for [employer].   On call weekend [facility] is Manuela Schwartz [phone]  1620: CSW updated family bedside. Son, Quita Skye, should be able to transport in private vehicle on DC.  Expected Discharge Plan: [place] Barriers to Discharge: Insurance Authorization  Expected Discharge Plan and Services Expected Discharge Plan: [place]       Living arrangements for the past 2 months: [place], [place]                 DME Arranged: Youth worker wheelchair with seat cushion DME Agency: [employer] Date DME Agency Contacted: 02/09/22 Time DME Agency Contacted: [id number] Representative spoke with at DME Agency: Jodell Cipro HH Arranged: RN, PT, OT, Nurse's Aide [place] Agency: [place] Date [place]: 02/09/22 Time [place]: [phone] Representative spoke with at [place] Housing: Tommi Rumps   Social Determinants of Health ([place]) Interventions    Readmission Risk Interventions     No data to display

## 2022-02-10 NOTE — Progress Notes (Signed)
PROGRESS NOTE    [name]  RFF:[id number] DOB: January 08, 1941 DOA: 02/06/2022 PCP: Cassandria Anger, MD   Brief Narrative: [name] is a 81 y.o. female with a history of hypertension, rheumatoid arthritis, macular degeneration, history of splenectomy, mesenteric artery stenosis, CAD s/p CABG, CKD stage IV, hypothyroidism, TIA, COPD, DVT, GERD. Patient presented secondary to increased drowsiness, fatigue and shortness of breath and was found to have evidence of acute PE while on Lovenox prophylaxis. Heparin IV initiated and patient is now transitioned to Eliquis.   Assessment and Plan:  Acute PE Provoked in setting of recent total left hip arthroplasty. Associated recent age indeterminate right lower extremity DVT which is not identifiable on recent LE venous duplex. CTA chest with subsegmental PE in the right middle lobe and no associated heart strain. Patient managed on Heparin IV initially. Transthoracic Echocardiogram without evidence of RV dysfunction. Heparin IV transitioned to Eliquis. -Continue Eliquis PO  Acute respiratory failure with hypoxia Secondary to acute PE. Weaned to room air but is now dyspneic with ambulation  Vulvovaginitis Symptomatic. -Fluconazole 150 mg x1 with repeat 72 hours after initial dose.  Leukocytosis Mildly elevated. Trended down slightly.  Urinary retention Diagnosed from previous admission and currently has an indwelling foley in place. -Continue foley catheter and follow-up with urology as an outpatient.  History of left THA Patient was previously at [facility].  CKD stage IV Creatinine is stable.  Normocytic anemia Hemoglobin trending down. No obvious evidence of bleeding. Complicated by heparin IV/Eliquis and Plavix. Hemoglobin of 7.8 this morning and is stable.  Rheumatoid arthritis Patient is on golimumab and leflunomide as an outpatient. Golimumab held by orthopedic surgery with recommendations to resume in 2 weeks. -Continue  leflunomide  CAD Noted. History of CABG. -Continue metoprolol and Lipitor  Hypothyroidism -Continue Synthroid 88 mcg daily  History of TIA Noted. -Continue Lipitor  GERD -Continue Protonix  History of abdominal aortic aneurysm S/p repair.  Mesenteric artery stenosis -Continue Lipitor and Plavix  DVT prophylaxis: Eliquis Code Status:   Code Status: Full Code Family Communication: Husband, son and daughter in law at bedside. Disposition Plan: Discharge to SNF pending bed availability   Consultants:  None  Procedures:  None  Antimicrobials: Vancomycin Cefepime Cefadroxil Fluconazole   Subjective: Patient reports no issues overnight. Some spot hemorrhage from hemorrhoids without hematochezia or melena.  Objective: BP 121/76   Pulse (!) 110   Temp 98.1 F (36.7 C) (Oral)   Resp 17 Comment: 17  Ht '5\' 3"'$  (1.6 m)   Wt 71.7 kg   SpO2 95%   BMI 28.01 kg/m   Examination:  General exam: Appears calm and comfortable Respiratory system: Clear to auscultation. Respiratory effort normal. Cardiovascular system: S1 & S2 heard, RRR. No murmurs, rubs, gallops or clicks. Gastrointestinal system: Abdomen is distended, soft and nontender. Normal bowel sounds heard. GU: vulva and inguinal area with inflamed, beefy skin with some tenderness Central nervous system: Alert and oriented. No focal neurological deficits. Musculoskeletal: BLE edema. No calf tenderness Skin: No cyanosis. No rashes Psychiatry: Judgement and insight appear normal. Mood & affect appropriate.    Data Reviewed: I have personally reviewed following labs and imaging studies  CBC Lab Results  Component Value Date   WBC 9.1 02/10/2022   RBC 2.40 (L) 02/10/2022   HGB 7.8 (L) 02/10/2022   HCT 22.8 (L) 02/10/2022   MCV 95.0 02/10/2022   MCH 32.5 02/10/2022   PLT 444 (H) 02/10/2022   MCHC 34.2 02/10/2022  RDW 16.0 (H) 02/10/2022   LYMPHSABS 2.7 01/03/2022   MONOABS 1.3 (H) 01/03/2022   EOSABS  0.5 01/03/2022   BASOSABS 0.2 (H) 51/76/1607     Last metabolic panel Lab Results  Component Value Date   NA 134 (L) 02/08/2022   K 3.8 02/08/2022   CL 100 02/08/2022   CO2 20 (L) 02/08/2022   BUN 35 (H) 02/08/2022   CREATININE 1.91 (H) 02/08/2022   GLUCOSE 117 (H) 02/08/2022   GFRNONAA 26 (L) 02/08/2022   GFRAA 24 (L) 12/26/2019   CALCIUM 8.7 (L) 02/08/2022   PROT 5.2 (L) 02/07/2022   ALBUMIN 2.1 (L) 02/07/2022   BILITOT 0.7 02/07/2022   ALKPHOS 110 02/07/2022   AST 48 (H) 02/07/2022   ALT 29 02/07/2022   ANIONGAP 14 02/08/2022    GFR: Estimated Creatinine Clearance: 21.2 mL/min (A) (by C-G formula based on SCr of 1.91 mg/dL (H)).  Recent Results (from the past 240 hour(s))  Culture, Urine (Do not remove urinary catheter, catheter placed by urology or difficult to place)     Status: Abnormal   Collection Time: 02/06/22  6:00 PM   Specimen: Urine, Catheterized  Result Value Ref Range Status   Specimen Description URINE, CATHETERIZED  Final   Special Requests   Final    NONE Performed at [facility], [place] [address] [place], [place] [address]    Culture 80,000 COLONIES/mL YEAST (A)  Final   Report Status 02/08/2022 FINAL  Final  SARS Coronavirus 2 by RT PCR (hospital order, performed in [facility] hospital lab) *cepheid single result test* Anterior Nasal Swab     Status: None   Collection Time: 02/06/22  6:18 PM   Specimen: Anterior Nasal Swab  Result Value Ref Range Status   SARS Coronavirus 2 by RT PCR NEGATIVE NEGATIVE Final    Comment: (NOTE) SARS-CoV-2 target nucleic acids are NOT DETECTED.  The SARS-CoV-2 RNA is generally detectable in upper and lower respiratory specimens during the acute phase of infection. The lowest concentration of SARS-CoV-2 viral copies this assay can detect is 250 copies / mL. A negative result does not preclude SARS-CoV-2 infection and should not be used as the sole basis for treatment or other patient management decisions.   A negative result may occur with improper specimen collection / handling, submission of specimen other than nasopharyngeal swab, presence of viral mutation(s) within the areas targeted by this assay, and inadequate number of viral copies (<250 copies / mL). A negative result must be combined with clinical observations, patient history, and epidemiological information.  Fact Sheet for Patients:   https://www.patel.info/  Fact Sheet for Healthcare Providers: https://hall.com/  This test is not yet approved or  cleared by the [place] FDA and has been authorized for detection and/or diagnosis of SARS-CoV-2 by FDA under an Emergency Use Authorization (EUA).  This EUA will remain in effect (meaning this test can be used) for the duration of the COVID-19 declaration under Section 564(b)(1) of the Act, 21 U.S.C. section 360bbb-3(b)(1), unless the authorization is terminated or revoked sooner.  Performed at [facility], [employer] [employer] [address], [place], [place] [address]       Radiology Studies: No results found.    LOS: 3 days    Cordelia Poche, MD [facility] 02/10/2022, 3:04 PM   If 7PM-7AM, please contact night-coverage [email or web address]

## 2022-02-10 NOTE — Progress Notes (Signed)
   02/10/22 1552  Mobility  Activity Ambulated with assistance in hallway  Level of Assistance Modified independent, requires aide device or extra time  Assistive Device Front wheel walker  Distance Ambulated (ft) 200 ft  Activity Response Tolerated well  Mobility Referral Yes  $Mobility charge 1 Mobility   Mobility Specialist Progress Note  Post-Mobility: 96% SpO2  Pt was in hall and agreeable for me to join her. X1 standing break d/t SOB. Returned to chair w/ all needs met and call bell in reach.  Lucious Groves Mobility Specialist

## 2022-02-10 NOTE — Care Management Important Message (Signed)
Important Message  Patient Details  Name: [name] MRN: [id number] Date of Birth: 08-Sep-1940   Medicare Important Message Given:  Yes     Shelda Altes 02/10/2022, 8:32 AM

## 2022-02-10 NOTE — TOC Progression Note (Signed)
Transition of Care [facility]) - Progression Note    Patient Details  Name: [name] MRN: [id number] Date of Birth: 1941-03-10  Transition of Care [facility]) CM/SW [place], [place] Phone Number: 02/10/2022, 2:10 PM  Clinical Narrative:     CSW spoke with [name] at [facility] who explains that family still has concerns about pt returning home and wants her to go to rehab. CSW is informed that family disagrees with PT eval. And reported to her that pt is needing more assistance currently. Notified MD. [name] states that they do not include SNF days without insurance coverage with pt's living arrangement. PT to reevaluate.   CSW spoke with pt's husband as well as Son, [name], and his wife. Explained that without SNF recommendation, insurance auth for SNF wouldn't be pursued. Explained that pt would be re-evaluated by PT. TOC will continue to follow.   [name].   Expected Discharge Plan: [place] Barriers to Discharge: Continued Medical Work up  Expected Discharge Plan and Services Expected Discharge Plan: [place] arrangements for the past 2 months: [place], [place]                 DME Arranged: Youth worker wheelchair with seat cushion DME Agency: [employer] Date DME Agency Contacted: 02/09/22 Time DME Agency Contacted: [phone] Representative spoke with at DME Agency: Jodell Cipro HH Arranged: RN, PT, OT, Nurse's Aide [place] Agency: [place] Date [place]: 02/09/22 Time [place]: [phone] Representative spoke with at [place]: Tommi Rumps   Social Determinants of Health ([place]) Interventions    Readmission Risk Interventions     No data to display

## 2022-02-11 DIAGNOSIS — I700 Atherosclerosis of aorta: Secondary | ICD-10-CM | POA: Diagnosis not present

## 2022-02-11 DIAGNOSIS — I714 Abdominal aortic aneurysm, without rupture, unspecified: Secondary | ICD-10-CM | POA: Diagnosis not present

## 2022-02-11 DIAGNOSIS — E559 Vitamin D deficiency, unspecified: Secondary | ICD-10-CM | POA: Diagnosis not present

## 2022-02-11 DIAGNOSIS — K648 Other hemorrhoids: Secondary | ICD-10-CM | POA: Diagnosis present

## 2022-02-11 DIAGNOSIS — K219 Gastro-esophageal reflux disease without esophagitis: Secondary | ICD-10-CM | POA: Diagnosis not present

## 2022-02-11 DIAGNOSIS — I824Z1 Acute embolism and thrombosis of unspecified deep veins of right distal lower extremity: Secondary | ICD-10-CM | POA: Diagnosis not present

## 2022-02-11 DIAGNOSIS — I10 Essential (primary) hypertension: Secondary | ICD-10-CM | POA: Diagnosis not present

## 2022-02-11 DIAGNOSIS — Z8249 Family history of ischemic heart disease and other diseases of the circulatory system: Secondary | ICD-10-CM | POA: Diagnosis not present

## 2022-02-11 DIAGNOSIS — M25552 Pain in left hip: Secondary | ICD-10-CM | POA: Diagnosis not present

## 2022-02-11 DIAGNOSIS — J449 Chronic obstructive pulmonary disease, unspecified: Secondary | ICD-10-CM | POA: Diagnosis present

## 2022-02-11 DIAGNOSIS — D62 Acute posthemorrhagic anemia: Secondary | ICD-10-CM | POA: Diagnosis present

## 2022-02-11 DIAGNOSIS — N760 Acute vaginitis: Secondary | ICD-10-CM | POA: Diagnosis present

## 2022-02-11 DIAGNOSIS — I129 Hypertensive chronic kidney disease with stage 1 through stage 4 chronic kidney disease, or unspecified chronic kidney disease: Secondary | ICD-10-CM | POA: Diagnosis present

## 2022-02-11 DIAGNOSIS — K625 Hemorrhage of anus and rectum: Secondary | ICD-10-CM | POA: Diagnosis not present

## 2022-02-11 DIAGNOSIS — Z87891 Personal history of nicotine dependence: Secondary | ICD-10-CM | POA: Diagnosis not present

## 2022-02-11 DIAGNOSIS — D631 Anemia in chronic kidney disease: Secondary | ICD-10-CM | POA: Diagnosis present

## 2022-02-11 DIAGNOSIS — Z823 Family history of stroke: Secondary | ICD-10-CM | POA: Diagnosis not present

## 2022-02-11 DIAGNOSIS — K5731 Diverticulosis of large intestine without perforation or abscess with bleeding: Secondary | ICD-10-CM | POA: Diagnosis present

## 2022-02-11 DIAGNOSIS — K5901 Slow transit constipation: Secondary | ICD-10-CM | POA: Diagnosis not present

## 2022-02-11 DIAGNOSIS — M353 Polymyalgia rheumatica: Secondary | ICD-10-CM | POA: Diagnosis present

## 2022-02-11 DIAGNOSIS — R338 Other retention of urine: Secondary | ICD-10-CM | POA: Diagnosis not present

## 2022-02-11 DIAGNOSIS — T45515A Adverse effect of anticoagulants, initial encounter: Secondary | ICD-10-CM | POA: Diagnosis present

## 2022-02-11 DIAGNOSIS — M1612 Unilateral primary osteoarthritis, left hip: Secondary | ICD-10-CM | POA: Diagnosis not present

## 2022-02-11 DIAGNOSIS — R809 Proteinuria, unspecified: Secondary | ICD-10-CM | POA: Diagnosis present

## 2022-02-11 DIAGNOSIS — Z7901 Long term (current) use of anticoagulants: Secondary | ICD-10-CM | POA: Diagnosis not present

## 2022-02-11 DIAGNOSIS — M6281 Muscle weakness (generalized): Secondary | ICD-10-CM | POA: Diagnosis not present

## 2022-02-11 DIAGNOSIS — I2699 Other pulmonary embolism without acute cor pulmonale: Secondary | ICD-10-CM | POA: Diagnosis present

## 2022-02-11 DIAGNOSIS — J90 Pleural effusion, not elsewhere classified: Secondary | ICD-10-CM | POA: Diagnosis not present

## 2022-02-11 DIAGNOSIS — R339 Retention of urine, unspecified: Secondary | ICD-10-CM | POA: Diagnosis not present

## 2022-02-11 DIAGNOSIS — K922 Gastrointestinal hemorrhage, unspecified: Secondary | ICD-10-CM | POA: Diagnosis present

## 2022-02-11 DIAGNOSIS — D649 Anemia, unspecified: Secondary | ICD-10-CM | POA: Diagnosis not present

## 2022-02-11 DIAGNOSIS — R0602 Shortness of breath: Secondary | ICD-10-CM | POA: Diagnosis not present

## 2022-02-11 DIAGNOSIS — I252 Old myocardial infarction: Secondary | ICD-10-CM | POA: Diagnosis not present

## 2022-02-11 DIAGNOSIS — R079 Chest pain, unspecified: Secondary | ICD-10-CM | POA: Diagnosis not present

## 2022-02-11 DIAGNOSIS — K551 Chronic vascular disorders of intestine: Secondary | ICD-10-CM | POA: Diagnosis present

## 2022-02-11 DIAGNOSIS — E8809 Other disorders of plasma-protein metabolism, not elsewhere classified: Secondary | ICD-10-CM | POA: Diagnosis present

## 2022-02-11 DIAGNOSIS — K429 Umbilical hernia without obstruction or gangrene: Secondary | ICD-10-CM | POA: Diagnosis present

## 2022-02-11 DIAGNOSIS — E785 Hyperlipidemia, unspecified: Secondary | ICD-10-CM | POA: Diagnosis present

## 2022-02-11 DIAGNOSIS — D6832 Hemorrhagic disorder due to extrinsic circulating anticoagulants: Secondary | ICD-10-CM | POA: Diagnosis present

## 2022-02-11 DIAGNOSIS — I2510 Atherosclerotic heart disease of native coronary artery without angina pectoris: Secondary | ICD-10-CM | POA: Diagnosis present

## 2022-02-11 DIAGNOSIS — R58 Hemorrhage, not elsewhere classified: Secondary | ICD-10-CM | POA: Diagnosis not present

## 2022-02-11 DIAGNOSIS — R350 Frequency of micturition: Secondary | ICD-10-CM | POA: Diagnosis not present

## 2022-02-11 DIAGNOSIS — E039 Hypothyroidism, unspecified: Secondary | ICD-10-CM | POA: Diagnosis present

## 2022-02-11 DIAGNOSIS — N184 Chronic kidney disease, stage 4 (severe): Secondary | ICD-10-CM | POA: Diagnosis present

## 2022-02-11 DIAGNOSIS — I639 Cerebral infarction, unspecified: Secondary | ICD-10-CM | POA: Diagnosis not present

## 2022-02-11 DIAGNOSIS — M069 Rheumatoid arthritis, unspecified: Secondary | ICD-10-CM | POA: Diagnosis present

## 2022-02-11 DIAGNOSIS — Z96642 Presence of left artificial hip joint: Secondary | ICD-10-CM | POA: Diagnosis not present

## 2022-02-11 MED ORDER — TAMSULOSIN HCL 0.4 MG PO CAPS: 0.4000 mg | ORAL_CAPSULE | Freq: Every day | ORAL | Status: DC

## 2022-02-11 MED ORDER — GOLIMUMAB 50 MG/0.5ML ~~LOC~~ SOAJ: 50.0000 mg | SUBCUTANEOUS | Status: DC

## 2022-02-11 MED ORDER — FLUCONAZOLE 150 MG PO TABS: 150.0000 mg | ORAL_TABLET | Freq: Once | ORAL | Status: AC

## 2022-02-11 MED ORDER — APIXABAN 5 MG PO TABS: ORAL_TABLET | ORAL | Status: DC

## 2022-02-11 MED ORDER — GABAPENTIN 300 MG PO CAPS: 300.0000 mg | ORAL_CAPSULE | Freq: Every day | ORAL | Status: DC

## 2022-02-11 MED ORDER — HYDROCORTISONE (PERIANAL) 2.5 % EX CREA: TOPICAL_CREAM | Freq: Two times a day (BID) | CUTANEOUS | 0 refills | Status: DC

## 2022-02-11 NOTE — Progress Notes (Addendum)
OT Co-Sign Note for COTA  Patient Details Name: [name] MRN: [id number] DOB: 10-02-1940   Progress Note: Discussed change to discharge plan with COTA.  Patient is needing a higher level of care post acute, and SNF is recommended.  Please see COTA note dated 10/14 at 1336.  OT agrees with updated discharge recommendation.         Deb Loudin D Iolanda Folson 02/11/2022, 2:02 PM 02/11/2022  RP, OTR/L  Acute Rehabilitation Services  Office:  [phone]

## 2022-02-11 NOTE — TOC Progression Note (Signed)
Transition of Care [facility]) - Progression Note    Patient Details  Name: [name] MRN: [id number] Date of Birth: 1940-08-15  Transition of Care [facility]) CM/SW [place], LCSW Phone Number: 02/11/2022, 10:08 AM  Clinical Narrative:     Pt's authorization is back. Health Plan #- [id number] Authorization dates: 10/14-10/17 Fax Number: [phone]    CSW spoke with Manuela Schwartz to ascertain if pt could be discharged today. Manuela Schwartz confirmed fax number and call to report number.  TOC team will continue to assist with discharge planning needs.   Expected Discharge Plan: [place] Barriers to Discharge: Insurance Authorization  Expected Discharge Plan and Services Expected Discharge Plan: [place]       Living arrangements for the past 2 months: [place], [place]                 DME Arranged: Youth worker wheelchair with seat cushion DME Agency: [employer] Date DME Agency Contacted: 02/09/22 Time DME Agency Contacted: [phone] Representative spoke with at DME Agency: Jodell Cipro HH Arranged: RN, PT, OT, Nurse's Aide [place] Agency: [place] Date [place]: 02/09/22 Time [place]: [phone] Representative spoke with at [place]: Tommi Rumps   Social Determinants of Health ([place]) Interventions    Readmission Risk Interventions     No data to display

## 2022-02-11 NOTE — TOC Transition Note (Signed)
Transition of Care [facility]) - CM/SW Discharge Note   Patient Details  Name: [name] MRN: [id number] Date of Birth: 03-02-1941  Transition [facility] Care [facility]) CM/SW Contact:  Bary Castilla, LCSW Phone Number: 02/11/2022, 1:22 PM   Clinical Narrative:     Patient will DC to:?[place] Anticipated DC date:?02/11/2022 Family notified:?spouse Transport KT:GYBWLS  Per MD patient ready for DC to [place] RN, patient, patient's family, and facility notified of DC. Discharge Summary sent to facility. RN given number for report  [phone] TL packet on chart. Ambulance transport requested for patient.   CSW signing off.   [place], [place] [phone]   Final next level of care: Skilled Nursing Facility Barriers to Discharge: Barriers Resolved   Patient Goals and CMS Choice Patient states their goals for this hospitalization and ongoing recovery are:: return home CMS Medicare.gov Compare Post Acute Care list provided to:: Patient Represenative (must comment) Choice offered to / list presented to : Adult Children  Discharge Placement              Patient chooses bed at:  ([place]) Patient to be transferred to facility by: Spouse Name of family member notified: Spouse Harvey Patient and family notified of of transfer: 02/11/22  Discharge Plan and Services                DME Arranged: Youth worker wheelchair with seat cushion DME Agency: [employer] Date DME Agency Contacted: 02/09/22 Time DME Agency Contacted: [phone] Representative spoke with at DME Agency: Jodell Cipro HH Arranged: RN, PT, OT, Nurse's Aide [place] Agency: [place] Date [place]: 02/09/22 Time [place]: [phone] Representative spoke with at [place]: Tommi Rumps  Social Determinants of Health ([place]) Interventions     Readmission Risk Interventions     No data to display

## 2022-02-11 NOTE — Progress Notes (Signed)
Occupational Therapy Treatment Patient Details Name: [name] MRN: [id number] DOB: 01-Feb-1941 Today's Date: 02/11/2022   History of present illness [name] is a 81 y.o. female [facility] increasing shortness of breath, fatigue and drowsiness. CT revealed PE.    PMHx: L THA 01/23/22, R DVT aortic aneurysm, CAD, previous CABG X 1, chronic kidney disease, COPD, cervical DDD, hyperlipidemia, HTN, hypothyroid, rheumatoid arthritis, previous MI and stroke, shortness of breath, previous R bunionectomy, L carpal tunnel release, R foot bunionectomy, cervical spine surgery, multiple abdominal surgeries, osteoporosis   OT comments  Patient received in supine and eager to participate. Patient able to get to EOB without assistance and required mod assist to donn socks and setup to donn gown for back. Patient able to stand with min assist to power up and stood at sink for self care tasks but required seated rest break following. Patient performed mobility and transfer training to address toilet tranfers with patient requiring min assist and verbal cues for hand placement. Discharge recommendation have changed to SNF for continued OT treatment to increase independence with self care tasks and functional transfers.    Recommendations for follow up therapy are one component of a multi-disciplinary discharge planning process, led by the attending physician.  Recommendations may be updated based on patient status, additional functional criteria and insurance authorization.    Follow Up Recommendations  Skilled nursing-short term rehab (<3 hours/day)    Assistance Recommended at Discharge Frequent or constant Supervision/Assistance  Patient can return home with the following  A little help with walking and/or transfers;A lot of help with bathing/dressing/bathroom;Assistance with cooking/housework;Help with stairs or ramp for entrance;Assist for transportation   Equipment  Recommendations  BSC/3in1;Wheelchair (measurements OT);Wheelchair cushion (measurements OT)    Recommendations for Other Services      Precautions / Restrictions Precautions Precautions: Fall Restrictions Weight Bearing Restrictions: No LLE Weight Bearing: Weight bearing as tolerated       Mobility Bed Mobility Overal bed mobility: Modified Independent Bed Mobility: Supine to Sit           General bed mobility comments: able to get to EOB without assistance    Transfers Overall transfer level: Needs assistance Equipment used: Rolling walker (2 wheels) Transfers: Sit to/from Stand Sit to Stand: Min assist           General transfer comment: min assist to stand from EOB and chair     Balance Overall balance assessment: Needs assistance Sitting-balance support: Feet supported Sitting balance-Leahy Scale: Good     Standing balance support: Single extremity supported, Bilateral upper extremity supported, During functional activity Standing balance-Leahy Scale: Poor Standing balance comment: one extremity support to stand at sink for grooming tasks                           ADL either performed or assessed with clinical judgement   ADL Overall ADL's : Needs assistance/impaired     Grooming: Wash/dry hands;Wash/dry face;Oral care;Min guard;Standing Grooming Details (indicate cue type and reason): required seated rest break following         Upper Body Dressing : Set up;Sitting Upper Body Dressing Details (indicate cue type and reason): to donn gown to cover back Lower Body Dressing: Moderate assistance;Sitting/lateral leans Lower Body Dressing Details (indicate cue type and reason): to donn socks seated on EOB Toilet Transfer: Minimal assistance;Ambulation;Rolling walker (2 wheels) Toilet Transfer Details (indicate cue type and reason): simulated  in room           General ADL Comments: cues for hand placement and safety    Extremity/Trunk  Assessment              Vision       Perception     Praxis      Cognition Arousal/Alertness: Awake/alert Behavior During Therapy: WFL for tasks assessed/performed, Flat affect Overall Cognitive Status: Within Functional Limits for tasks assessed Area of Impairment: Problem solving, Awareness, Safety/judgement, Following commands, Memory, Attention, Orientation                 Orientation Level: Time Current Attention Level: Selective Memory: Decreased short-term memory Following Commands: Follows one step commands with increased time Safety/Judgement: Decreased awareness of deficits Awareness: Intellectual Problem Solving: Slow processing, Requires verbal cues, Requires tactile cues General Comments: eager to participate        Exercises      Shoulder Instructions       General Comments HR at EOB 102, 114 standing at sink, 123 with mobility    Pertinent Vitals/ Pain       Pain Assessment Pain Assessment: Faces Faces Pain Scale: Hurts a little bit Pain Location: L hip Pain Descriptors / Indicators: Grimacing Pain Intervention(s): Limited activity within patient's tolerance, Monitored during session, Repositioned  Home Living                                          Prior Functioning/Environment              Frequency  Min 2X/week        Progress Toward Goals  OT Goals(current goals can now be found in the care plan section)  Progress towards OT goals: Progressing toward goals  Acute Rehab OT Goals Patient Stated Goal: go home OT Goal Formulation: With patient Time For Goal Achievement: 02/22/22 Potential to Achieve Goals: Good ADL Goals Pt Will Perform Grooming: with supervision;standing Pt Will Perform Lower Body Bathing: with supervision;sit to/from stand;with adaptive equipment Pt Will Perform Lower Body Dressing: with supervision;sit to/from stand;with adaptive equipment Pt Will Transfer to Toilet: with  supervision;ambulating;bedside commode Pt Will Perform Toileting - Clothing Manipulation and hygiene: with supervision;sit to/from stand  Plan Discharge plan remains appropriate    Co-evaluation                 AM-PAC OT "6 Clicks" Daily Activity     Outcome Measure   Help from another person eating meals?: None Help from another person taking care of personal grooming?: A Little Help from another person toileting, which includes using toliet, bedpan, or urinal?: A Little Help from another person bathing (including washing, rinsing, drying)?: A Lot Help from another person to put on and taking off regular upper body clothing?: A Little Help from another person to put on and taking off regular lower body clothing?: A Lot 6 Click Score: 17    End of Session Equipment Utilized During Treatment: Rolling walker (2 wheels)  OT Visit Diagnosis: Unsteadiness on feet (R26.81);Other abnormalities of gait and mobility (R26.89);Pain;Other symptoms and signs involving cognitive function Pain - Right/Left: Left Pain - part of body: Hip   Activity Tolerance Patient tolerated treatment well   Patient Left in chair;with call bell/phone within reach;with chair alarm set;with family/visitor present   Nurse Communication Mobility status        Time:  9558-3167 OT Time Calculation (min): 28 min  Charges: OT General Charges $OT Visit: 1 Visit OT Treatments $Self Care/Home Management : 23-37 mins  Lodema Hong, [place]  Office [place] 02/11/2022, 1:34 PM

## 2022-02-11 NOTE — Discharge Summary (Signed)
Physician Discharge Summary   Patient: [name] MRN: [id number] DOB: 06-Nov-1940  Admit date:     02/06/2022  Discharge date: 02/11/22  Discharge Physician: Cordelia Poche, MD   PCP: Cassandria Anger, MD   Recommendations at discharge:  Continue Eliquis for PE PCP follow-up Orthopedic surgery follow-up; recommendations to hold  golimumab for two weeks Urology follow-up for voiding trial as previously scheduled  Discharge Diagnoses: Principal Problem:   Acute pulmonary embolism ([place]) Active Problems:   CKD (chronic kidney disease) stage 4, GFR 15-29 ml/min (HCC)   Essential hypertension   Rheumatoid arthritis ([place])   Hypothyroidism   Macular degeneration (senile) of retina   ASPLENIA   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 *  Hospital Course: [name] is a 81 y.o. female with a history of hypertension, rheumatoid arthritis, macular degeneration, history of splenectomy, mesenteric artery stenosis, CAD s/p CABG, CKD stage IV, hypothyroidism, TIA, COPD, DVT, GERD. Patient presented secondary to increased drowsiness, fatigue and shortness of breath and was found to have evidence of acute PE while on Lovenox prophylaxis. Heparin IV initiated and patient is now transitioned to Eliquis.  Assessment and Plan:  Acute PE Provoked in setting of recent total left hip arthroplasty. Associated recent age indeterminate right lower extremity DVT which is not identifiable on recent LE venous duplex. CTA chest with subsegmental PE in the right middle lobe and no associated heart strain. Patient managed on Heparin IV initially. Transthoracic Echocardiogram  without evidence of RV dysfunction. Heparin IV transitioned to Eliquis. Continue Eliquis.   Acute respiratory failure with hypoxia Secondary to acute PE. Weaned to room air but is now dyspneic with ambulation   Vulvovaginitis Symptomatic. Patient given Fluconazole 150 mg x1 with plan for repeat dosing on 10/16. Local barrier care.   Leukocytosis Mildly elevated. Trended down slightly.   Urinary retention Diagnosed from previous admission and currently has an indwelling foley in place. Continue foley catheter and follow-up with urology as an outpatient.   History of left THA Patient was previously at [facility].   CKD stage IV Creatinine is stable.   Normocytic anemia Hemoglobin trending down. No obvious evidence of bleeding. Complicated by heparin IV/Eliquis and Plavix. Hemoglobin of 7.8 this morning and is stable.   Rheumatoid arthritis Patient is on golimumab and leflunomide as an outpatient. Golimumab held by orthopedic surgery with recommendations to resume in 2 weeks. Continue leflunomide.   CAD Noted. History of CABG. Continue metoprolol and Lipitor   Hypothyroidism Continue Synthroid 88 mcg daily   History of TIA Noted. Continue Lipitor.   GERD Continue Protonix.   History of abdominal aortic aneurysm S/p repair.   Mesenteric artery stenosis Continue Lipitor and Plavix.   Consultants: None Procedures performed: None  Disposition: Skilled nursing facility Diet recommendation: Cardiac diet   DISCHARGE MEDICATION: Allergies as of 02/11/2022       Reactions   Nsaids Other (See Comments)   Pt states all NSAIDS does not sit well onstomach   Aspirin Other (See Comments)   Hurts stomach   Codeine Nausea And Vomiting   Nitroglycerin Other (See Comments)   Heart rate drops  Medication List     STOP taking these medications    enoxaparin 40 MG/0.4ML injection Commonly known as: LOVENOX   oxyCODONE-acetaminophen 5-325 MG tablet Commonly known as:  PERCOCET/ROXICET       TAKE these medications    albuterol 108 (90 Base) MCG/ACT inhaler Commonly known as: VENTOLIN HFA Inhale 1-2 puffs into the lungs every 4 (four) hours as needed for wheezing or shortness of breath.   amLODipine 2.5 MG tablet Commonly known as: NORVASC Take 1 tablet (2.5 mg total) by mouth daily.   apixaban 5 MG Tabs tablet Commonly known as: ELIQUIS Take 2 tablets (10 mg total) by mouth 2 (two) times daily for 4 days, THEN 1 tablet (5 mg total) 2 (two) times daily. Start taking on: February 11, 2022   atorvastatin 80 MG tablet Commonly known as: LIPITOR Take 80 mg by mouth daily.   CENTRUM ADULT PO Take 1 tablet by mouth daily. 1 Tablet Daily.   Cyanocobalamin 2500 MCG Tabs Take 2,500 mcg by mouth daily.   docusate sodium 100 MG capsule Commonly known as: COLACE Take 100 mg by mouth 2 (two) times daily.   famotidine 20 MG tablet Commonly known as: PEPCID Take 20 mg by mouth 2 (two) times daily.   fluconazole 150 MG tablet Commonly known as: DIFLUCAN Take 1 tablet (150 mg total) by mouth once for 1 dose. Take on 02/13/2022. Start taking on: February 13, 2022   gabapentin 300 MG capsule Commonly known as: NEURONTIN Take 1 capsule (300 mg total) by mouth at bedtime. What changed: when to take this   Golimumab 50 MG/0.5ML Soaj Take 50 mg by mouth every 2 (two) months. Hold for the next 2 weeks per Orthopedic surgery recommendations. What changed: additional instructions   hydrochlorothiazide 12.5 MG tablet Commonly known as: HYDRODIURIL TAKE 1 TABLET BY MOUTH EVERY DAY   hydrocortisone 2.5 % rectal cream Commonly known as: ANUSOL-HC Place rectally 2 (two) times daily.   leflunomide 20 MG tablet Commonly known as: ARAVA Take 20 mg by mouth daily.   levothyroxine 88 MCG tablet Commonly known as: SYNTHROID TAKE 1 TABLET (88 MCG TOTAL) BY MOUTH DAILY BEFORE BREAKFAST.   metoprolol tartrate 50 MG tablet Commonly known as:  LOPRESSOR Take 50 mg by mouth 2 (two) times daily.   pantoprazole 40 MG tablet Commonly known as: PROTONIX Take 40 mg by mouth daily.   Stiolto Respimat 2.5-2.5 MCG/ACT Aers Generic drug: Tiotropium Bromide-Olodaterol Inhale 2 each into the lungs at bedtime.   tamsulosin 0.4 MG Caps capsule Commonly known as: FLOMAX Take 1 capsule (0.4 mg total) by mouth daily. Start taking on: February 12, 2022   triamcinolone 55 MCG/ACT Aero nasal inhaler Commonly known as: NASACORT Place 1 spray into the nose daily.   Vitamin D3 50 MCG (2000 UT) capsule Take 1 capsule (2,000 Units total) by mouth daily.        Follow-up Information     [facility] Follow up.   Why: light weight wheelchair Contact information: [place] [address] [phone]         [facility] Follow up.   Specialty: Home Health Services Why: Agency will contact you to set up apts Contact information: [place] [address] [phone]                Discharge Exam: BP 106/60 (BP Location: Left Arm)   Pulse 98   Temp 97.6 F (36.4 C) (Oral)  Resp 19   Ht '5\' 3"'$  (1.6 m)   Wt 71.7 kg   SpO2 96%   BMI 28.01 kg/m   General exam: Appears calm and comfortable Respiratory system: Clear to auscultation. Respiratory effort normal. Cardiovascular system: S1 & S2 heard, RRR. Gastrointestinal system: Abdomen is nondistended, soft and nontender. Normal bowel sounds heard. Central nervous system: Alert and oriented. No focal neurological deficits. Musculoskeletal: No calf tenderness Skin: No cyanosis. No rashes Psychiatry: Judgement and insight appear normal. Mood & affect appropriate.   Condition at discharge: stable  The results of significant diagnostics from this hospitalization (including imaging, microbiology, ancillary and laboratory) are listed below for reference.   Imaging Studies: VAS [place] LOWER EXTREMITY VENOUS  (DVT)  Result Date: 02/07/2022  Lower Venous DVT Study Patient Name:  [name]  Date of Exam:   02/07/2022 Medical Rec #: [id number]       Accession #:    [id number] Date of Birth: [birth year]        Patient Gender: F Patient Age:   46 years Exam Location:  [facility] Procedure:      VAS [place] LOWER EXTREMITY VENOUS (DVT) Referring Phys: Sheppard Coil MELVIN --------------------------------------------------------------------------------  Indications: Edema.  Limitations: Pain tolerance. Comparison Study: 01/28/22 prior Performing Technologist: Archie Patten RVS  Examination Guidelines: A complete evaluation includes B-mode imaging, spectral Doppler, color Doppler, and power Doppler as needed of all accessible portions of each vessel. Bilateral testing is considered an integral part of a complete examination. Limited examinations for reoccurring indications may be performed as noted. The reflux portion of the exam is performed with the patient in reverse Trendelenburg.  +---------+---------------+---------+-----------+----------+-------------------+ RIGHT    CompressibilityPhasicitySpontaneityPropertiesThrombus Aging      +---------+---------------+---------+-----------+----------+-------------------+ CFV      Full           Yes      Yes                                      +---------+---------------+---------+-----------+----------+-------------------+ SFJ      Full                                                             +---------+---------------+---------+-----------+----------+-------------------+ FV Prox  Full                                                             +---------+---------------+---------+-----------+----------+-------------------+ FV Mid                  Yes      Yes                  unable to tolerate                                                        compression due to  pain  tolerance-                                                           patent by color                                                           doppler             +---------+---------------+---------+-----------+----------+-------------------+ FV Distal               Yes      Yes                  unable to tolerate                                                        compression due to                                                        pain tolerance-                                                           patent by color                                                           doppler             +---------+---------------+---------+-----------+----------+-------------------+ PFV      Full                                                             +---------+---------------+---------+-----------+----------+-------------------+ POP      Full           Yes      Yes                                      +---------+---------------+---------+-----------+----------+-------------------+ PTV      Full                                                             +---------+---------------+---------+-----------+----------+-------------------+  PERO     Full           Yes      Yes                  patent by color                                                           doppler             +---------+---------------+---------+-----------+----------+-------------------+   +---------+---------------+---------+-----------+----------+-------------------+ LEFT     CompressibilityPhasicitySpontaneityPropertiesThrombus Aging      +---------+---------------+---------+-----------+----------+-------------------+ CFV      Full           Yes      Yes                                      +---------+---------------+---------+-----------+----------+-------------------+ SFJ      Full                                                              +---------+---------------+---------+-----------+----------+-------------------+ FV Prox  Full                                                             +---------+---------------+---------+-----------+----------+-------------------+ FV Mid                  Yes      Yes                  unable to tolerate                                                        compression due to                                                        pain tolerance-                                                           patent by color  doppler             +---------+---------------+---------+-----------+----------+-------------------+ FV Distal               Yes      Yes                  unable to tolerate                                                        compression due to                                                        pain tolerance-                                                           patent by color                                                           doppler             +---------+---------------+---------+-----------+----------+-------------------+ PFV      Full                                                             +---------+---------------+---------+-----------+----------+-------------------+ POP      Full           Yes      Yes                                      +---------+---------------+---------+-----------+----------+-------------------+ PTV      Full                                                             +---------+---------------+---------+-----------+----------+-------------------+ PERO     Full                                                             +---------+---------------+---------+-----------+----------+-------------------+     Summary: RIGHT: - There is no evidence of deep vein thrombosis in the  lower extremity.  - A cystic structure is found in the popliteal  fossa.  LEFT: - There is no evidence of deep vein thrombosis in the lower extremity.  - No cystic structure found in the popliteal fossa.  *See table(s) above for measurements and observations. Electronically signed by Deitra Mayo MD on 02/07/2022 at 6:17:09 PM.    Final    ECHOCARDIOGRAM COMPLETE  Result Date: 02/07/2022    ECHOCARDIOGRAM REPORT   Patient Name:   [name] Date of Exam: 02/07/2022 Medical Rec #:  [id number]      Height:       63.0 in Accession #:    [id number]     Weight:       158.7 lb Date of Birth:  1940/09/21       BSA:          1.753 m Patient Age:    81 years       BP:           120/41 mmHg Patient Gender: F              HR:           79 bpm. Exam Location:  Inpatient Procedure: 2D Echo, 3D Echo, Cardiac Doppler and Color Doppler Indications:    Pulmonary Embolus I26.09  History:        Patient has prior history of Echocardiogram examinations, most                 recent 03/10/2019. CAD, Prior CABG, COPD and TIA; Risk                 Factors:Hypertension. History of AAA repair, GERD, GERD, Chronic                 kidney disease. Mesenteric artery stenosis.  Sonographer:    Darlina Sicilian RDCS Referring Phys: [id number] [place]  1. Left ventricular ejection fraction, by estimation, is 65 to 70%. Left ventricular ejection fraction by 3D volume is 68 %. The left ventricle has normal function. The left ventricle has no regional wall motion abnormalities. There is severe asymmetric  left ventricular hypertrophy. Left ventricular diastolic parameters are consistent with Grade II diastolic dysfunction (pseudonormalization).  2. Right ventricular systolic function is normal. The right ventricular size is normal. There is normal pulmonary artery systolic pressure.  3. The mitral valve is normal in structure. Trivial mitral valve regurgitation. No evidence of mitral stenosis.  4. The aortic valve is  tricuspid. There is mild thickening of the aortic valve. Aortic valve regurgitation is not visualized. Comparison(s): Prior images reviewed side by side. MR has improved. FINDINGS  Left Ventricle: Left ventricular ejection fraction, by estimation, is 65 to 70%. Left ventricular ejection fraction by 3D volume is 68 %. The left ventricle has normal function. The left ventricle has no regional wall motion abnormalities. The left ventricular internal cavity size was small. There is severe asymmetric left ventricular hypertrophy. Left ventricular diastolic parameters are consistent with Grade II diastolic dysfunction (pseudonormalization). Right Ventricle: The right ventricular size is normal. No increase in right ventricular wall thickness. Right ventricular systolic function is normal. There is normal pulmonary artery systolic pressure. The tricuspid regurgitant velocity is 2.16 m/s, and  with an assumed right atrial pressure of 3 mmHg, the estimated right ventricular systolic pressure is 16.0 mmHg. Left Atrium: Left atrial size was normal in size. Right Atrium: Right atrial size was normal in size. Pericardium: There is no evidence of pericardial effusion. Presence of epicardial fat layer. Mitral Valve: The mitral valve  is normal in structure. Trivial mitral valve regurgitation. No evidence of mitral valve stenosis. Tricuspid Valve: The tricuspid valve is normal in structure. Tricuspid valve regurgitation is mild . No evidence of tricuspid stenosis. Aortic Valve: The aortic valve is tricuspid. There is mild thickening of the aortic valve. Aortic valve regurgitation is not visualized. Pulmonic Valve: The pulmonic valve was normal in structure. Pulmonic valve regurgitation is mild. No evidence of pulmonic stenosis. Aorta: The aortic root and ascending aorta are structurally normal, with no evidence of dilitation. IAS/Shunts: No atrial level shunt detected by color flow Doppler.  LEFT VENTRICLE PLAX 2D LVIDd:          3.60 cm         Diastology LVIDs:         2.30 cm         LV e' medial:    4.05 cm/s LV PW:         1.10 cm         LV E/e' medial:  17.2 LV IVS:        1.50 cm         LV e' lateral:   4.87 cm/s LVOT diam:     2.40 cm         LV E/e' lateral: 14.3 LV SV:         55 LV SV Index:   31 LVOT Area:     4.52 cm        3D Volume EF                                LV 3D EF:    Left                                             ventricul                                             ar                                             ejection                                             fraction                                             by 3D                                             volume is  68 %.                                 3D Volume EF:                                3D EF:        68 %                                LV EDV:       96 ml                                LV ESV:       30 ml                                LV SV:        66 ml RIGHT VENTRICLE RV Basal diam:  3.80 cm RV Mid diam:    2.00 cm RV S prime:     12.90 cm/s TAPSE (M-mode): 1.4 cm LEFT ATRIUM             Index        RIGHT ATRIUM           Index LA diam:        4.00 cm 2.28 cm/m   RA Area:     13.60 cm LA Vol (A2C):   55.7 ml 31.78 ml/m  RA Volume:   33.30 ml  19.00 ml/m LA Vol (A4C):   33.5 ml 19.11 ml/m LA Biplane Vol: 43.5 ml 24.82 ml/m  AORTIC VALVE LVOT Vmax:   72.40 cm/s LVOT Vmean:  48.500 cm/s LVOT VTI:    0.122 m  AORTA Ao Root diam: 3.20 cm Ao Asc diam:  2.80 cm MITRAL VALVE               TRICUSPID VALVE MV Area (PHT): 3.43 cm    TR Peak grad:   18.7 mmHg MV Decel Time: 221 msec    TR Vmax:        216.00 cm/s MV E velocity: 69.80 cm/s MV A velocity: 69.40 cm/s  SHUNTS MV E/A ratio:  1.01        Systemic VTI:  0.12 m                            Systemic Diam: 2.40 cm Rudean Haskell MD Electronically signed by Rudean Haskell MD Signature Date/Time: 02/07/2022/1:27:00 PM    Final    CT  Angio Chest PE W and/or Wo Contrast  Result Date: 02/06/2022 CLINICAL DATA:  Provided history: Pulmonary embolism (PE) suspected, high prob reduce dose contrast Dural venous and constipation. Recent hip replacement. EXAM: CT ANGIOGRAPHY CHEST WITH CONTRAST TECHNIQUE: Multidetector CT imaging of the chest was performed using the standard protocol during bolus administration of intravenous contrast. Multiplanar CT image reconstructions and MIPs were obtained to evaluate the vascular anatomy. RADIATION DOSE REDUCTION: This exam was performed according to the departmental dose-optimization program which includes automated exposure control, adjustment of the mA and/or kV according to patient size and/or use of iterative reconstruction technique. CONTRAST:  80  cc Omnipaque 350 IV COMPARISON:  Radiograph earlier today. Chest CT 03/17/2021. FINDINGS: Cardiovascular: Isolated subsegmental embolus in the medial right middle lobe pulmonary artery, for example series 9, image 171 and series 10, image 58. No additional pulmonary emboli. Calcified and noncalcified atheromatous plaque throughout the thoracic aorta. No aneurysm or dissection. Post CABG. Upper normal heart size. No pericardial effusion. Mediastinum/Nodes: No enlarged mediastinal or hilar lymph nodes. No visible thyroid nodule. Tiny hiatal hernia. Lungs/Pleura: Small right and trace left pleural effusion. Associated atelectasis. The previous subpleural right lower lobe nodule is obscured on the current exam due to pleural fluid and atelectasis. Postsurgical change in the left upper lobe. There is mild bronchial thickening in the lower lobes. Upper Abdomen: No acute upper abdominal findings. Elongated left lobe of the liver. Musculoskeletal: Stable punctate sclerotic focus within T4 vertebral body likely a bone island. Median sternotomy. No acute osseous findings. Review of the MIP images confirms the above findings. IMPRESSION: 1. Isolated subsegmental embolus in  the medial right middle lobe pulmonary artery. 2. Small right and trace left pleural effusion. Associated atelectasis. 3. Mild bronchial thickening in the lower lobes. Aortic Atherosclerosis (ICD10-I70.0). Critical Value/emergent results were called by telephone at the time of interpretation on 02/06/2022 at 10:25 pm to provider DAVID YAO , who verbally acknowledged these results. Electronically Signed   By: Keith Rake M.D.   On: 02/06/2022 22:26   CT Head Wo Contrast  Result Date: 02/06/2022 CLINICAL DATA:  Mental status change, unknown cause EXAM: CT HEAD WITHOUT CONTRAST TECHNIQUE: Contiguous axial images were obtained from the base of the skull through the vertex without intravenous contrast. RADIATION DOSE REDUCTION: This exam was performed according to the departmental dose-optimization program which includes automated exposure control, adjustment of the mA and/or kV according to patient size and/or use of iterative reconstruction technique. COMPARISON:  02/15/2020 FINDINGS: Brain: No evidence of acute infarction, hemorrhage, mass, mass effect, or midline shift. No hydrocephalus or extra-axial fluid collection. Mildly advanced cerebral atrophy for age. Vascular: No hyperdense vessel. Skull: Normal. Negative for fracture or focal lesion. Sinuses/Orbits: No acute finding. Status post bilateral lens replacements. Other: The mastoid air cells are well aerated. IMPRESSION: No acute intracranial process. Electronically Signed   By: Merilyn Baba M.D.   On: 02/06/2022 14:50   DG Chest 2 View  Result Date: 02/06/2022 CLINICAL DATA:  Cough EXAM: CHEST - 2 VIEW COMPARISON:  Chest x-ray dated Sep 26, 2019 FINDINGS: Cardiac and mediastinal contours are within normal limits. Prior median sternotomy and CABG. Small bilateral pleural effusions and bibasilar atelectasis. No evidence of pneumothorax. IMPRESSION: Small bilateral pleural effusions and bibasilar atelectasis. Electronically Signed   By: Yetta Glassman M.D.   On: 02/06/2022 14:49   VAS [place] LOWER EXTREMITY VENOUS (DVT)  Result Date: 01/28/2022  Lower Venous DVT Study Patient Name:  [name]  Date of Exam:   01/28/2022 Medical Rec #: [id number]       Accession #:    [id number] Date of Birth: Sep 05, 1940        Patient Gender: F Patient Age:   62 years Exam Location:  [facility] Procedure:      VAS [place] LOWER EXTREMITY VENOUS (DVT) Referring Phys: Gloriann Loan --------------------------------------------------------------------------------  Indications: Edema, and Swelling.  Comparison Study: no prior Performing Technologist: Archie Patten RVS  Examination Guidelines: A complete evaluation includes B-mode imaging, spectral Doppler, color Doppler, and power Doppler as needed of all accessible portions of each vessel. Bilateral testing is considered an  integral part of a complete examination. Limited examinations for reoccurring indications may be performed as noted. The reflux portion of the exam is performed with the patient in reverse Trendelenburg.  +--------+---------------+---------+-----------+----------+--------------------+ RIGHT   CompressibilityPhasicitySpontaneityPropertiesThrombus Aging       +--------+---------------+---------+-----------+----------+--------------------+ CFV     Full           Yes      Yes                                       +--------+---------------+---------+-----------+----------+--------------------+ SFJ     Full                                                              +--------+---------------+---------+-----------+----------+--------------------+ FV Prox Full                                                              +--------+---------------+---------+-----------+----------+--------------------+ FV Mid  Full                                                              +--------+---------------+---------+-----------+----------+--------------------+ FV      Full                                                               Distal                                                                    +--------+---------------+---------+-----------+----------+--------------------+ PFV     Full                                                              +--------+---------------+---------+-----------+----------+--------------------+ POP     Full           Yes      Yes                                       +--------+---------------+---------+-----------+----------+--------------------+ PTV     Full                                                              +--------+---------------+---------+-----------+----------+--------------------+  PERO    None                                         in a single paired                                                        vein                 +--------+---------------+---------+-----------+----------+--------------------+   +---------+---------------+---------+-----------+----------+--------------+ LEFT     CompressibilityPhasicitySpontaneityPropertiesThrombus Aging +---------+---------------+---------+-----------+----------+--------------+ CFV      Full           Yes      Yes                                 +---------+---------------+---------+-----------+----------+--------------+ SFJ      Full                                                        +---------+---------------+---------+-----------+----------+--------------+ FV Prox  Full                                                        +---------+---------------+---------+-----------+----------+--------------+ FV Mid   Full                                                        +---------+---------------+---------+-----------+----------+--------------+ FV DistalFull                                                        +---------+---------------+---------+-----------+----------+--------------+  PFV      Full                                                        +---------+---------------+---------+-----------+----------+--------------+ POP      Full           Yes      Yes                                 +---------+---------------+---------+-----------+----------+--------------+ PTV      Full                                                        +---------+---------------+---------+-----------+----------+--------------+  PERO     Full                                                        +---------+---------------+---------+-----------+----------+--------------+     Summary: RIGHT: - 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 - A cystic structure is found in the popliteal fossa.  LEFT: - There is no evidence of deep vein thrombosis in the lower extremity.  - No cystic structure found in the popliteal fossa.  *See table(s) above for measurements and observations. Electronically signed by Deitra Mayo MD on 01/28/2022 at 4:00:42 PM.    Final    DG Pelvis Portable  Result Date: 01/23/2022 CLINICAL DATA:  Postop left hip arthroplasty. EXAM: PORTABLE PELVIS 1-2 VIEWS COMPARISON:  Left hip radiographs 10/06/2021 FINDINGS: Sequelae of interval left total hip arthroplasty are identified. Gas is noted in the adjacent soft tissues. No acute fracture or dislocation is evident on this single image. There is mild superior hip joint space narrowing on the right. IMPRESSION: Left total hip arthroplasty without evidence of acute osseous abnormality. Electronically Signed   By: Logan Bores M.D.   On: 01/23/2022 10:43   DG HIP UNILAT WITH PELVIS 1V LEFT  Result Date: 01/23/2022 CLINICAL DATA:  Fluoroscopy provided during hip replacement. Fluoroscopy time: 24 seconds. Cumulative air kerma: 2.49 mGy EXAM: DG HIP (WITH OR WITHOUT PELVIS) 1V*L* COMPARISON:  None Available. FINDINGS: Four images were obtained during left hip  replacement. By the end of the study, the left hip is been replaced in hardware is in good position on frontal imaging. IMPRESSION: Fluoroscopy provided during left hip replacement as above. Electronically Signed   By: Dorise Bullion III M.D.   On: 01/23/2022 09:07   DG C-Arm 1-60 Min-No Report  Result Date: 01/23/2022 Fluoroscopy was utilized by the requesting physician.  No radiographic interpretation.   DG C-Arm 1-60 Min-No Report  Result Date: 01/23/2022 Fluoroscopy was utilized by the requesting physician.  No radiographic interpretation.    Microbiology: Results for orders placed or performed during the hospital encounter of 02/06/22  Culture, Urine (Do not remove urinary catheter, catheter placed by urology or difficult to place)     Status: Abnormal   Collection Time: 02/06/22  6:00 PM   Specimen: Urine, Catheterized  Result Value Ref Range Status   Specimen Description URINE, CATHETERIZED  Final   Special Requests   Final    NONE Performed at [facility], [place] [address] [place], [place] [address]    Culture 80,000 COLONIES/mL YEAST (A)  Final   Report Status 02/08/2022 FINAL  Final  SARS Coronavirus 2 by RT PCR (hospital order, performed in [facility] hospital lab) *cepheid single result test* Anterior Nasal Swab     Status: None   Collection Time: 02/06/22  6:18 PM   Specimen: Anterior Nasal Swab  Result Value Ref Range Status   SARS Coronavirus 2 by RT PCR NEGATIVE NEGATIVE Final    Comment: (NOTE) SARS-CoV-2 target nucleic acids are NOT DETECTED.  The SARS-CoV-2 RNA is generally detectable in upper and lower respiratory specimens during the acute phase of infection. The lowest concentration of SARS-CoV-2 viral copies this assay can detect is 250 copies / mL. A negative result does not preclude SARS-CoV-2 infection and should not be used  as the sole basis for treatment or other patient management decisions.  A negative result may occur with improper specimen  collection / handling, submission of specimen other than nasopharyngeal swab, presence of viral mutation(s) within the areas targeted by this assay, and inadequate number of viral copies (<250 copies / mL). A negative result must be combined with clinical observations, patient history, and epidemiological information.  Fact Sheet for Patients:   https://www.patel.info/  Fact Sheet for Healthcare Providers: https://hall.com/  This test is not yet approved or  cleared by the [place] FDA and has been authorized for detection and/or diagnosis of SARS-CoV-2 by FDA under an Emergency Use Authorization (EUA).  This EUA will remain in effect (meaning this test can be used) for the duration of the COVID-19 declaration under Section 564(b)(1) of the Act, 21 U.S.C. section 360bbb-3(b)(1), unless the authorization is terminated or revoked sooner.  Performed at [facility], [place] [address] [place], [place] [address]     Labs: CBC: Recent Labs  Lab 02/06/22 1529 02/07/22 0315 02/08/22 0606 02/09/22 0037 02/10/22 0143  WBC 14.5* 14.2* 11.1* 9.0 9.1  HGB 9.6* 8.2* 7.9* 7.5* 7.8*  HCT 29.8* 25.8* 22.7* 21.9* 22.8*  MCV 101.4* 100.0 93.4 94.4 95.0  PLT 422* 385 386 404* 784*   Basic Metabolic Panel: Recent Labs  Lab 02/06/22 1529 02/07/22 0315 02/08/22 0606  NA 133* 134* 134*  K 4.3 3.7 3.8  CL 98 102 100  CO2 20* 19* 20*  GLUCOSE 122* 89 117*  BUN 33* 30* 35*  CREATININE 1.89* 1.90* 1.91*  CALCIUM 8.2* 7.9* 8.7*  MG 1.3*  --   --    Liver Function Tests: Recent Labs  Lab 02/06/22 1529 02/07/22 0315  AST 63* 48*  ALT 36 29  ALKPHOS 123 110  BILITOT 0.5 0.7  PROT 6.5 5.2*  ALBUMIN 2.4* 2.1*    Discharge time spent: 35 minutes.  Signed: Cordelia Poche, MD [facility] 02/11/2022

## 2022-02-13 ENCOUNTER — Encounter: Payer: Self-pay | Admitting: Nurse Practitioner

## 2022-02-13 ENCOUNTER — Non-Acute Institutional Stay (SKILLED_NURSING_FACILITY): Payer: Medicare PPO | Admitting: Nurse Practitioner

## 2022-02-13 DIAGNOSIS — K625 Hemorrhage of anus and rectum: Secondary | ICD-10-CM | POA: Diagnosis not present

## 2022-02-13 DIAGNOSIS — M069 Rheumatoid arthritis, unspecified: Secondary | ICD-10-CM

## 2022-02-13 DIAGNOSIS — D62 Acute posthemorrhagic anemia: Secondary | ICD-10-CM

## 2022-02-13 DIAGNOSIS — I2699 Other pulmonary embolism without acute cor pulmonale: Secondary | ICD-10-CM

## 2022-02-13 DIAGNOSIS — M1612 Unilateral primary osteoarthritis, left hip: Secondary | ICD-10-CM

## 2022-02-13 DIAGNOSIS — I10 Essential (primary) hypertension: Secondary | ICD-10-CM | POA: Diagnosis not present

## 2022-02-13 DIAGNOSIS — N184 Chronic kidney disease, stage 4 (severe): Secondary | ICD-10-CM

## 2022-02-13 DIAGNOSIS — J449 Chronic obstructive pulmonary disease, unspecified: Secondary | ICD-10-CM | POA: Diagnosis not present

## 2022-02-13 DIAGNOSIS — I824Z1 Acute embolism and thrombosis of unspecified deep veins of right distal lower extremity: Secondary | ICD-10-CM | POA: Diagnosis not present

## 2022-02-13 DIAGNOSIS — E785 Hyperlipidemia, unspecified: Secondary | ICD-10-CM

## 2022-02-13 DIAGNOSIS — R339 Retention of urine, unspecified: Secondary | ICD-10-CM | POA: Diagnosis not present

## 2022-02-13 DIAGNOSIS — E559 Vitamin D deficiency, unspecified: Secondary | ICD-10-CM | POA: Diagnosis not present

## 2022-02-13 DIAGNOSIS — K5901 Slow transit constipation: Secondary | ICD-10-CM

## 2022-02-13 DIAGNOSIS — K219 Gastro-esophageal reflux disease without esophagitis: Secondary | ICD-10-CM

## 2022-02-13 DIAGNOSIS — I714 Abdominal aortic aneurysm, without rupture, unspecified: Secondary | ICD-10-CM

## 2022-02-13 DIAGNOSIS — E039 Hypothyroidism, unspecified: Secondary | ICD-10-CM

## 2022-02-13 DIAGNOSIS — G459 Transient cerebral ischemic attack, unspecified: Secondary | ICD-10-CM

## 2022-02-13 NOTE — Assessment & Plan Note (Signed)
prn Albuterol ACT, Stiolto, Triamcinolone nasal spray, O2 2lpm 

## 2022-02-13 NOTE — Assessment & Plan Note (Signed)
Stable, takes Colace, MiraLax, Senokot S

## 2022-02-13 NOTE — Assessment & Plan Note (Signed)
s/p CABG x1, LIMA LAD for ostial LAD, EF wnl. S/p aneurysm repair 

## 2022-02-13 NOTE — Assessment & Plan Note (Signed)
Stable,  takes Famotidine, Pantoprazole 

## 2022-02-13 NOTE — Assessment & Plan Note (Signed)
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 Was on Lovenox for 28 days. Prn Percocet, Methocarbamol

## 2022-02-13 NOTE — Assessment & Plan Note (Signed)
,   take Vit B12, Hgb 7.8 02/10/22, repeat CBC/diff, update Iron, Vit B12, Folate, Ferritin, TIBC

## 2022-02-13 NOTE — Assessment & Plan Note (Signed)
Bun/creat 35/1.91 02/08/22,  followed by Nephrology, repeat CMP/eGFR

## 2022-02-13 NOTE — Assessment & Plan Note (Signed)
01/28/22 US age indeterminate deep vein thrombosis  involving the right peroneal veins, on Eliquis 

## 2022-02-13 NOTE — Assessment & Plan Note (Signed)
Hydrocortisone cream, avoid constipation

## 2022-02-13 NOTE — Assessment & Plan Note (Signed)
Hospitalized 02/06/22-02/11/22 for acute PE, on Eliquis now, held Golimumab for 2 weeks until f/u with orthopedic surgeon

## 2022-02-13 NOTE — Assessment & Plan Note (Signed)
takes Levothyroxine, TSH 2.35 06/21/21 

## 2022-02-13 NOTE — Assessment & Plan Note (Signed)
takes Golimumab(on hold for 2 weeks), Leflunomide, fx of knees, hands, wrists, cervical disc disease/fracture, takes Gabapentin,

## 2022-02-13 NOTE — Assessment & Plan Note (Signed)
Foley, f/u Urology, voiding trial

## 2022-02-13 NOTE — Assessment & Plan Note (Signed)
takes Atorvastatin, LDL 74 09/07/20 

## 2022-02-13 NOTE — Assessment & Plan Note (Signed)
on Atorvastatin 

## 2022-02-13 NOTE — Assessment & Plan Note (Signed)
Blood pressure is controlled,  takes Amlodipine, Metoprolol, HCTZ

## 2022-02-13 NOTE — Assessment & Plan Note (Signed)
takes Vit D 

## 2022-02-13 NOTE — Progress Notes (Unsigned)
Location:   SNF [facility]   Place of Service:  SNF (31) Provider: Lennie Odor Kloie Whiting NP  [name], Sherry Lacks, MD  Patient Care Team: Sherry Anger, MD as PCP - General (Internal Medicine) Sherry Merwyn Hodapp, MD as PCP - Cardiology (Cardiology) Sherry Louvina Cleary, MD as Consulting Physician (Cardiology) Sherry Pound, MD as Consulting Physician (Rheumatology) [name], Sherry Raspberry, MD as Consulting Physician (Gastroenterology) Sherry Rockers, MD as Consulting Physician (Pulmonary Disease) Sherry Isaac, MD (Inactive) as Consulting Physician (Cardiothoracic Surgery) Sherry Gell, MD as Consulting Physician (Obstetrics and Gynecology) [name], Sherry Lacks, MD  Extended Emergency Contact Information Primary Emergency Contact: [name] Address: [address]           [place], [place] [address] [place] Phone: [phone] Mobile Phone: [phone] Relation: Spouse Secondary Emergency Contact: [name],[name] Address: [address] [place] [address] [place] Phone: [phone] Relation: Friend  Code Status: DNR Goals of care: Advanced Directive information    02/07/2022    8:13 AM  Advanced Directives  Does Patient Have a Medical Advance Directive? No     Chief Complaint  Patient presents with   Acute Visit    Medication review following hospitalization.     HPI:  Pt is a 81 y.o. female seen today for an acute visit for medication review following hospital stay.    Hospitalized 02/06/22-02/11/22 for acute PE, on Eliquis now, held Golimumab for 2 weeks until f/u with orthopedic surgeon  01/23/22 s/p total replacement of L hip, f/u [name] 02/07/22, on perioperative antibiotics from admission on ward-Cefadroxil. Was on Lovenox for 28 days. Prn Percocet, Methocarbamol  DOE/COPD, prn Albuterol ACT, Stiolto, Triamcinolone nasal spray, O2 2lpm  BRBPR hemorrhoids, Hydrocortisone cream, avoid constipation              Urinary retention, Foley, f/u Urology, voiding trial            DVT 01/28/22 [place] age indeterminate deep vein thrombosis  involving the right peroneal veins, on Eliquis             HTN, takes Amlodipine, Metoprolol, HCTZ             HLD takes Atorvastatin, LDL 74 09/07/20             Vit D deficiency, takes Vit D             Anemia, take Vit B12, Hgb 7.8 02/10/22             Constipation, takes Colace, MiraLax, Senokot S             GERD, takes Famotidine, Pantoprazole             RA, takes Golimumab(on hold for 2 weeks), Leflunomide, fx of knees, hands, wrists, cervical disc disease/fracture, takes Gabapentin,              Hypothyroidism, takes Levothyroxine, TSH 2.35 06/21/21             CAD s/p CABG x1, LIMA LAD for ostial LAD, EF wnl. 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 Most likely scar. Mild subendocardial ischemia. EF 71% LOW RISK.    Cervical disc disease    fracture   Chronic kidney disease    COPD (chronic obstructive pulmonary disease) (HCC)    Diverticulosis    Hyperlipidemia    Hypertension    Hypothyroidism (acquired)    hypo   Myocardial infarction ([place]) 11/2014   TIA   Polymyalgia rheumatica ([place])     2011 Sherry. [facility] Night   Rheumatoid arthritis [facility]) 2011   Sherry.[place]; fracture knees, hands and wrists -    S/P CABG x 1 1996   CABG--LIMA-LAD for ostial LAD (not felt to be PCI amenable). 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 Atretic LIMA. Minimal disease the RCA and Circumflex system.   CARPAL TUNNEL RELEASE Left    CATARACT EXTRACTION Bilateral    CERVICAL SPINE SURGERY     plate 2008 Sherry. [place]   COLONOSCOPY     CORONARY ARTERY BYPASS GRAFT  1996   INCLUDED AN INTERNAL MAMMARY ARTERY TO THE LAD. EF WAS NORMAL   INGUINAL HERNIA REPAIR Right    LAPAROSCOPIC APPENDECTOMY N/A 06/28/2016   Procedure: APPENDECTOMY LAPAROSCOPIC;  Surgeon: Leighton Ruff, MD;  Location: [facility];  Service: General;  Laterality: N/A;   NM MYOVIEW LTD  06/2010   Fixed anteroseptal, apical and inferoapical defect with moderate size. Most likely scar. Mild subendocardial ischemia. EF 71% LOW RISK.    SPLENECTOMY     TOTAL HIP ARTHROPLASTY Left 01/23/2022   Procedure: LEFT TOTAL HIP ARTHROPLASTY ANTERIOR APPROACH;  Surgeon: Leandrew Koyanagi, MD;  Location: [place];  Service: Orthopedics;  Laterality: Left;  3-C   TRANSTHORACIC ECHOCARDIOGRAM  12/2014   [facility]: Normal LV size & function. EF 55-60%,    vagina polyp     VIDEO ASSISTED THORACOSCOPY (VATS)/WEDGE RESECTION Left 03/02/2015   Procedure: VIDEO ASSISTED THORACOSCOPY (VATS), MINI THORACOTOMY, LEFT UPPER LOBE WEDGE, TAKE DOWN OF INTERNAL MAMMARY LESIONS, PLACEMENT OF ON-Q PUMP;  Surgeon: Sherry Isaac, MD;   Location: [place];  Service: Thoracic;  Laterality: Left;   VIDEO BRONCHOSCOPY N/A 03/02/2015   Procedure: BRONCHOSCOPY;  Surgeon: Sherry Isaac, MD;  Location: [place];  Service: Thoracic;  Laterality: N/A;   VIDEO BRONCHOSCOPY WITH ENDOBRONCHIAL NAVIGATION N/A  10/08/2017   Procedure: VIDEO BRONCHOSCOPY WITH ENDOBRONCHIAL NAVIGATION WITH BIOPSIES OF LEFT UPPER LOBE AND LEFT LOWER LOBE;  Surgeon: Sherry Isaac, MD;  Location: [place];  Service: Thoracic;  Laterality: N/A;    Allergies  Allergen Reactions   Nsaids Other (See Comments)    Pt states all NSAIDS does not sit well onstomach   Aspirin Other (See Comments)    Hurts stomach   Codeine Nausea And Vomiting   Nitroglycerin Other (See Comments)    Heart rate drops    Allergies as of 02/13/2022       Reactions   Nsaids Other (See Comments)   Pt states all NSAIDS does not sit well onstomach   Aspirin Other (See Comments)   Hurts stomach   Codeine Nausea And Vomiting   Nitroglycerin Other (See Comments)   Heart rate drops        Medication List        Accurate as of February 13, 2022 11:59 PM. If you have any questions, ask your nurse or doctor.          albuterol 108 (90 Base) MCG/ACT inhaler Commonly known as: VENTOLIN HFA Inhale 1-2 puffs into the lungs every 4 (four) hours as needed for wheezing or shortness of breath.   amLODipine 2.5 MG tablet Commonly known as: NORVASC Take 1 tablet (2.5 mg total) by mouth daily.   apixaban 5 MG Tabs tablet Commonly known as: ELIQUIS Take 2 tablets (10 mg total) by mouth 2 (two) times daily for 4 days, THEN 1 tablet (5 mg total) 2 (two) times daily. Start taking on: February 11, 2022   atorvastatin 80 MG tablet Commonly known as: LIPITOR Take 80 mg by mouth daily.   CENTRUM ADULT PO Take 1 tablet by mouth daily. 1 Tablet Daily.   Cyanocobalamin 2500 MCG Tabs Take 2,500 mcg by mouth daily.   docusate sodium 100 MG capsule Commonly known as: COLACE Take 100 mg  by mouth 2 (two) times daily.   famotidine 20 MG tablet Commonly known as: PEPCID Take 20 mg by mouth 2 (two) times daily.   fluconazole 150 MG tablet Commonly known as: DIFLUCAN Take 1 tablet (150 mg total) by mouth once for 1 dose. Take on 02/13/2022.   gabapentin 300 MG capsule Commonly known as: NEURONTIN Take 1 capsule (300 mg total) by mouth at bedtime.   Golimumab 50 MG/0.5ML Soaj Take 50 mg by mouth every 2 (two) months. Hold for the next 2 weeks per Orthopedic surgery recommendations.   hydrochlorothiazide 12.5 MG tablet Commonly known as: HYDRODIURIL TAKE 1 TABLET BY MOUTH EVERY DAY   hydrocortisone 2.5 % rectal cream Commonly known as: ANUSOL-HC Place rectally 2 (two) times daily.   leflunomide 20 MG tablet Commonly known as: ARAVA Take 20 mg by mouth daily.   levothyroxine 88 MCG tablet Commonly known as: SYNTHROID TAKE 1 TABLET (88 MCG TOTAL) BY MOUTH DAILY BEFORE BREAKFAST.   metoprolol tartrate 50 MG tablet Commonly known as: LOPRESSOR Take 50 mg by mouth 2 (two) times daily.   pantoprazole 40 MG tablet Commonly known as: PROTONIX Take 40 mg by mouth daily.   Stiolto Respimat 2.5-2.5 MCG/ACT Aers Generic drug: Tiotropium Bromide-Olodaterol Inhale 2 each into the lungs at bedtime.   tamsulosin 0.4 MG Caps capsule Commonly known as: FLOMAX Take 1 capsule (0.4 mg total) by mouth daily.   triamcinolone 55 MCG/ACT Aero nasal inhaler Commonly known as: NASACORT Place 1 spray into the nose daily.   Vitamin  D3 50 MCG (2000 UT) capsule Take 1 capsule (2,000 Units total) by mouth daily.        Review of Systems  Constitutional:  Positive for activity change, appetite change and fatigue. Negative for fever.  HENT:  Negative for congestion and trouble swallowing.   Eyes:  Negative for visual disturbance.  Respiratory:  Positive for cough and shortness of breath. Negative for wheezing.        DOE  Cardiovascular:  Positive for leg swelling.   Gastrointestinal:  Negative for abdominal pain, anal bleeding, constipation and vomiting.  Genitourinary:  Positive for difficulty urinating.       Foley  Musculoskeletal:  Positive for arthralgias, back pain, gait problem and joint swelling.  Skin:  Negative for color change.  Neurological:  Negative for tremors and headaches.  Psychiatric/Behavioral:  Negative for behavioral problems and sleep disturbance. The patient is not nervous/anxious.     Immunization History  Administered Date(s) Administered   Fluad Quad(high Dose 65+) 02/03/2021, 01/16/2022   Influenza Whole 05/02/2001, 01/27/2008, 01/12/2010, 12/31/2010, 01/25/2012   Influenza, High Dose Seasonal PF 01/14/2014, 02/13/2015, 12/21/2016, 01/09/2018, 12/29/2018   Influenza,inj,Quad PF,6+ Mos 12/23/2015   Influenza-Unspecified 02/03/2013, 01/13/2014, 02/13/2015, 01/20/2020   Meningococcal Polysaccharide 03/21/2012   Moderna SARS-COV2 Booster Vaccination 03/15/2020   Moderna Sars-Covid-2 Vaccination 06/02/2019, 06/30/2019   PPD Test 11/18/2013   Pfizer Covid-19 Vaccine Bivalent Booster 3yr & up 01/19/2021, 10/12/2021   Pneumococcal Conjugate-13 03/20/2013   Pneumococcal Polysaccharide-23 05/02/2000, 06/23/2010, 02/26/2018   Td 05/01/2006   Tdap 06/01/2016   Zoster Recombinat (Shingrix) 05/16/2017, 07/20/2017   Zoster, Live 01/17/2006   Pertinent  Health Maintenance Due  Topic Date Due   COLONOSCOPY (Pts 45-481yrInsurance coverage will need to be confirmed)  03/30/2019   INFLUENZA VACCINE  Completed   DEXA SCAN  Completed      02/08/2022    8:45 PM 02/09/2022   10:00 AM 02/10/2022    4:00 AM 02/10/2022    8:00 AM 02/10/2022    9:04 PM  Fall Risk  Patient Fall Risk Level Moderate fall risk High fall risk High fall risk High fall risk High fall risk   Functional Status Survey:    Vitals:   02/13/22 1255  BP: (!) 158/70  Pulse: 100  Resp: 20  Temp: 97.9 F (36.6 C)  SpO2: 91%   There is no height or  weight on file to calculate BMI. Physical Exam Constitutional:      Comments: Exhausted easily.   HENT:     Head: Normocephalic and atraumatic.     Nose: Nose normal.     Mouth/Throat:     Mouth: Mucous membranes are moist.  Eyes:     Extraocular Movements: Extraocular movements intact.     Conjunctiva/sclera: Conjunctivae normal.     Pupils: Pupils are equal, round, and reactive to light.  Cardiovascular:     Rate and Rhythm: Normal rate and regular rhythm.     Heart sounds: No murmur heard. Pulmonary:     Effort: Pulmonary effort is normal.     Breath sounds: No wheezing or rhonchi.  Abdominal:     General: Bowel sounds are normal.     Palpations: Abdomen is soft.     Tenderness: There is no abdominal tenderness.     Hernia: No hernia is present.  Genitourinary:    Comments: External hemorrhoids from previous examination.  Musculoskeletal:        General: Tenderness present.     Cervical back: Normal range  of motion and neck supple.     Right lower leg: Edema present.     Left lower leg: Edema present.     Comments: Moderate edema LLE, trace edema RLE  Skin:    General: Skin is warm and dry.     Comments: L hip surgical incisions covered in dressing.   Neurological:     General: No focal deficit present.     Mental Status: She is alert and oriented to person, place, and time. Mental status is at baseline.     Motor: No weakness.     Gait: Gait abnormal.  Psychiatric:        Mood and Affect: Mood normal.        Behavior: Behavior normal.        Thought Content: Thought content normal.     Labs reviewed: Recent Labs    02/06/22 1529 02/07/22 0315 02/08/22 0606  NA 133* 134* 134*  K 4.3 3.7 3.8  CL 98 102 100  CO2 20* 19* 20*  GLUCOSE 122* 89 117*  BUN 33* 30* 35*  CREATININE 1.89* 1.90* 1.91*  CALCIUM 8.2* 7.9* 8.7*  MG 1.3*  --   --    Recent Labs    01/03/22 0952 02/06/22 1529 02/07/22 0315  AST 19 63* 48*  ALT 12 36 29  ALKPHOS 72 123 110   BILITOT 0.6 0.5 0.7  PROT 6.9 6.5 5.2*  ALBUMIN 3.8 2.4* 2.1*   Recent Labs    12/06/21 0948 01/03/22 0952 01/23/22 0644 02/08/22 0606 02/09/22 0037 02/10/22 0143  WBC 8.5 8.6   < > 11.1* 9.0 9.1  NEUTROABS 3.6 4.0  --   --   --   --   HGB 11.0* 12.2   < > 7.9* 7.5* 7.8*  HCT 32.6* 37.1   < > 22.7* 21.9* 22.8*  MCV 97.3 99.0   < > 93.4 94.4 95.0  PLT 314.0 349.0   < > 386 404* 444*   < > = values in this interval not displayed.   Lab Results  Component Value Date   TSH 2.35 06/21/2021   No results found for: "HGBA1C" Lab Results  Component Value Date   CHOL 157 09/07/2020   HDL 59.30 09/07/2020   LDLCALC 74 09/07/2020   LDLDIRECT 162.1 10/28/2009   TRIG 121.0 09/07/2020   CHOLHDL 3 09/07/2020    Significant Diagnostic Results in last 30 days:  VAS [place] LOWER EXTREMITY VENOUS (DVT)  Result Date: 02/07/2022  Lower Venous DVT Study Patient Name:  JESSEKA DRINKARD  Date of Exam:   02/07/2022 Medical Rec #: [id number]       Accession #:    [id number] Date of Birth: Dec 26, 1940        Patient Gender: F Patient Age:   88 years Exam Location:  [facility] Procedure:      VAS [place] LOWER EXTREMITY VENOUS (DVT) Referring Phys: Sheppard Coil MELVIN --------------------------------------------------------------------------------  Indications: Edema.  Limitations: Pain tolerance. Comparison Study: 01/28/22 prior Performing Technologist: Archie Patten RVS  Examination Guidelines: A complete evaluation includes B-mode imaging, spectral Doppler, color Doppler, and power Doppler as needed of all accessible portions of each vessel. Bilateral testing is considered an integral part of a complete examination. Limited examinations for reoccurring indications may be performed as noted. The reflux portion of the exam is performed with the patient in reverse Trendelenburg.  +---------+---------------+---------+-----------+----------+-------------------+ RIGHT     CompressibilityPhasicitySpontaneityPropertiesThrombus Aging      +---------+---------------+---------+-----------+----------+-------------------+ CFV  Full           Yes      Yes                                      +---------+---------------+---------+-----------+----------+-------------------+ SFJ      Full                                                             +---------+---------------+---------+-----------+----------+-------------------+ FV Prox  Full                                                             +---------+---------------+---------+-----------+----------+-------------------+ FV Mid                  Yes      Yes                  unable to tolerate                                                        compression due to                                                        pain tolerance-                                                           patent by color                                                           doppler             +---------+---------------+---------+-----------+----------+-------------------+ FV Distal               Yes      Yes                  unable to tolerate                                                        compression due to  pain tolerance-                                                           patent by color                                                           doppler             +---------+---------------+---------+-----------+----------+-------------------+ PFV      Full                                                             +---------+---------------+---------+-----------+----------+-------------------+ POP      Full           Yes      Yes                                      +---------+---------------+---------+-----------+----------+-------------------+ PTV      Full                                                              +---------+---------------+---------+-----------+----------+-------------------+ PERO     Full           Yes      Yes                  patent by color                                                           doppler             +---------+---------------+---------+-----------+----------+-------------------+   +---------+---------------+---------+-----------+----------+-------------------+ LEFT     CompressibilityPhasicitySpontaneityPropertiesThrombus Aging      +---------+---------------+---------+-----------+----------+-------------------+ CFV      Full           Yes      Yes                                      +---------+---------------+---------+-----------+----------+-------------------+ SFJ      Full                                                             +---------+---------------+---------+-----------+----------+-------------------+ FV Prox  Full                                                             +---------+---------------+---------+-----------+----------+-------------------+  FV Mid                  Yes      Yes                  unable to tolerate                                                        compression due to                                                        pain tolerance-                                                           patent by color                                                           doppler             +---------+---------------+---------+-----------+----------+-------------------+ FV Distal               Yes      Yes                  unable to tolerate                                                        compression due to                                                        pain tolerance-                                                           patent by color                                                            doppler             +---------+---------------+---------+-----------+----------+-------------------+ PFV      Full                                                             +---------+---------------+---------+-----------+----------+-------------------+  POP      Full           Yes      Yes                                      +---------+---------------+---------+-----------+----------+-------------------+ PTV      Full                                                             +---------+---------------+---------+-----------+----------+-------------------+ PERO     Full                                                             +---------+---------------+---------+-----------+----------+-------------------+     Summary: RIGHT: - There is no evidence of deep vein thrombosis in the lower extremity.  - A cystic structure is found in the popliteal fossa.  LEFT: - There is no evidence of deep vein thrombosis in the lower extremity.  - No cystic structure found in the popliteal fossa.  *See table(s) above for measurements and observations. Electronically signed by Deitra Mayo MD on 02/07/2022 at 6:17:09 PM.    Final    ECHOCARDIOGRAM COMPLETE  Result Date: 02/07/2022    ECHOCARDIOGRAM REPORT   Patient Name:   CHARMIAN FORBIS Date of Exam: 02/07/2022 Medical Rec #:  [id number]      Height:       63.0 in Accession #:    [id number]     Weight:       158.7 lb Date of Birth:  [birth year]       BSA:          1.753 m Patient Age:    89 years       BP:           120/41 mmHg Patient Gender: F              HR:           79 bpm. Exam Location:  Inpatient Procedure: 2D Echo, 3D Echo, Cardiac Doppler and Color Doppler Indications:    Pulmonary Embolus I26.09  History:        Patient has prior history of Echocardiogram examinations, most                 recent 03/10/2019. CAD, Prior CABG, COPD and TIA; Risk                 Factors:Hypertension. History of  AAA repair, GERD, GERD, Chronic                 kidney disease. Mesenteric artery stenosis.  Sonographer:    Darlina Sicilian RDCS Referring Phys: [id number] [place]  1. Left ventricular ejection fraction, by estimation, is 65 to 70%. Left ventricular ejection fraction by 3D volume is 68 %. The left ventricle has normal function. The left ventricle has no regional wall motion abnormalities. There is severe asymmetric  left ventricular hypertrophy. Left ventricular diastolic parameters are consistent with Grade II diastolic dysfunction (pseudonormalization).  2. Right ventricular systolic function is normal. The right ventricular size is normal. There is normal pulmonary artery systolic pressure.  3. The mitral valve is normal in structure. Trivial mitral valve regurgitation. No evidence of mitral stenosis.  4. The aortic valve is tricuspid. There is mild thickening of the aortic valve. Aortic valve regurgitation is not visualized. Comparison(s): Prior images reviewed side by side. MR has improved. FINDINGS  Left Ventricle: Left ventricular ejection fraction, by estimation, is 65 to 70%. Left ventricular ejection fraction by 3D volume is 68 %. The left ventricle has normal function. The left ventricle has no regional wall motion abnormalities. The left ventricular internal cavity size was small. There is severe asymmetric left ventricular hypertrophy. Left ventricular diastolic parameters are consistent with Grade II diastolic dysfunction (pseudonormalization). Right Ventricle: The right ventricular size is normal. No increase in right ventricular wall thickness. Right ventricular systolic function is normal. There is normal pulmonary artery systolic pressure. The tricuspid regurgitant velocity is 2.16 m/s, and  with an assumed right atrial pressure of 3 mmHg, the estimated right ventricular systolic pressure is 94.1 mmHg. Left Atrium: Left atrial size was normal in size. Right Atrium: Right atrial  size was normal in size. Pericardium: There is no evidence of pericardial effusion. Presence of epicardial fat layer. Mitral Valve: The mitral valve is normal in structure. Trivial mitral valve regurgitation. No evidence of mitral valve stenosis. Tricuspid Valve: The tricuspid valve is normal in structure. Tricuspid valve regurgitation is mild . No evidence of tricuspid stenosis. Aortic Valve: The aortic valve is tricuspid. There is mild thickening of the aortic valve. Aortic valve regurgitation is not visualized. Pulmonic Valve: The pulmonic valve was normal in structure. Pulmonic valve regurgitation is mild. No evidence of pulmonic stenosis. Aorta: The aortic root and ascending aorta are structurally normal, with no evidence of dilitation. IAS/Shunts: No atrial level shunt detected by color flow Doppler.  LEFT VENTRICLE PLAX 2D LVIDd:         3.60 cm         Diastology LVIDs:         2.30 cm         LV e' medial:    4.05 cm/s LV PW:         1.10 cm         LV E/e' medial:  17.2 LV IVS:        1.50 cm         LV e' lateral:   4.87 cm/s LVOT diam:     2.40 cm         LV E/e' lateral: 14.3 LV SV:         55 LV SV Index:   31 LVOT Area:     4.52 cm        3D Volume EF                                LV 3D EF:    Left                                             ventricul  ar                                             ejection                                             fraction                                             by 3D                                             volume is                                             68 %.                                 3D Volume EF:                                3D EF:        68 %                                LV EDV:       96 ml                                LV ESV:       30 ml                                LV SV:        66 ml RIGHT VENTRICLE RV Basal diam:  3.80 cm RV Mid diam:    2.00 cm RV S prime:     12.90 cm/s TAPSE  (M-mode): 1.4 cm LEFT ATRIUM             Index        RIGHT ATRIUM           Index LA diam:        4.00 cm 2.28 cm/m   RA Area:     13.60 cm LA Vol (A2C):   55.7 ml 31.78 ml/m  RA Volume:   33.30 ml  19.00 ml/m LA Vol (A4C):   33.5 ml 19.11 ml/m LA Biplane Vol: 43.5 ml 24.82 ml/m  AORTIC VALVE LVOT Vmax:   72.40 cm/s LVOT Vmean:  48.500 cm/s LVOT VTI:    0.122 m  AORTA Ao Root diam: 3.20 cm Ao Asc diam:  2.80 cm MITRAL VALVE  TRICUSPID VALVE MV Area (PHT): 3.43 cm    TR Peak grad:   18.7 mmHg MV Decel Time: 221 msec    TR Vmax:        216.00 cm/s MV E velocity: 69.80 cm/s MV A velocity: 69.40 cm/s  SHUNTS MV E/A ratio:  1.01        Systemic VTI:  0.12 m                            Systemic Diam: 2.40 cm Rudean Haskell MD Electronically signed by Rudean Haskell MD Signature Date/Time: 02/07/2022/1:27:00 PM    Final    CT Angio Chest PE W and/or Wo Contrast  Result Date: 02/06/2022 CLINICAL DATA:  Provided history: Pulmonary embolism (PE) suspected, high prob reduce dose contrast Dural venous and constipation. Recent hip replacement. EXAM: CT ANGIOGRAPHY CHEST WITH CONTRAST TECHNIQUE: Multidetector CT imaging of the chest was performed using the standard protocol during bolus administration of intravenous contrast. Multiplanar CT image reconstructions and MIPs were obtained to evaluate the vascular anatomy. RADIATION DOSE REDUCTION: This exam was performed according to the departmental dose-optimization program which includes automated exposure control, adjustment of the mA and/or kV according to patient size and/or use of iterative reconstruction technique. CONTRAST:  80 cc Omnipaque 350 IV COMPARISON:  Radiograph earlier today. Chest CT 03/17/2021. FINDINGS: Cardiovascular: Isolated subsegmental embolus in the medial right middle lobe pulmonary artery, for example series 9, image 171 and series 10, image 58. No additional pulmonary emboli. Calcified and noncalcified atheromatous  plaque throughout the thoracic aorta. No aneurysm or dissection. Post CABG. Upper normal heart size. No pericardial effusion. Mediastinum/Nodes: No enlarged mediastinal or hilar lymph nodes. No visible thyroid nodule. Tiny hiatal hernia. Lungs/Pleura: Small right and trace left pleural effusion. Associated atelectasis. The previous subpleural right lower lobe nodule is obscured on the current exam due to pleural fluid and atelectasis. Postsurgical change in the left upper lobe. There is mild bronchial thickening in the lower lobes. Upper Abdomen: No acute upper abdominal findings. Elongated left lobe of the liver. Musculoskeletal: Stable punctate sclerotic focus within T4 vertebral body likely a bone island. Median sternotomy. No acute osseous findings. Review of the MIP images confirms the above findings. IMPRESSION: 1. Isolated subsegmental embolus in the medial right middle lobe pulmonary artery. 2. Small right and trace left pleural effusion. Associated atelectasis. 3. Mild bronchial thickening in the lower lobes. Aortic Atherosclerosis (ICD10-I70.0). Critical Value/emergent results were called by telephone at the time of interpretation on 02/06/2022 at 10:25 pm to provider DAVID YAO , who verbally acknowledged these results. Electronically Signed   By: Keith Rake M.D.   On: 02/06/2022 22:26   CT Head Wo Contrast  Result Date: 02/06/2022 CLINICAL DATA:  Mental status change, unknown cause EXAM: CT HEAD WITHOUT CONTRAST TECHNIQUE: Contiguous axial images were obtained from the base of the skull through the vertex without intravenous contrast. RADIATION DOSE REDUCTION: This exam was performed according to the departmental dose-optimization program which includes automated exposure control, adjustment of the mA and/or kV according to patient size and/or use of iterative reconstruction technique. COMPARISON:  02/15/2020 FINDINGS: Brain: No evidence of acute infarction, hemorrhage, mass, mass effect, or  midline shift. No hydrocephalus or extra-axial fluid collection. Mildly advanced cerebral atrophy for age. Vascular: No hyperdense vessel. Skull: Normal. Negative for fracture or focal lesion. Sinuses/Orbits: No acute finding. Status post bilateral lens replacements. Other: The mastoid air cells are well aerated. IMPRESSION: No acute  intracranial process. Electronically Signed   By: Merilyn Baba M.D.   On: 02/06/2022 14:50   DG Chest 2 View  Result Date: 02/06/2022 CLINICAL DATA:  Cough EXAM: CHEST - 2 VIEW COMPARISON:  Chest x-ray dated Sep 26, 2019 FINDINGS: Cardiac and mediastinal contours are within normal limits. Prior median sternotomy and CABG. Small bilateral pleural effusions and bibasilar atelectasis. No evidence of pneumothorax. IMPRESSION: Small bilateral pleural effusions and bibasilar atelectasis. Electronically Signed   By: Yetta Glassman M.D.   On: 02/06/2022 14:49   VAS [place] LOWER EXTREMITY VENOUS (DVT)  Result Date: 01/28/2022  Lower Venous DVT Study Patient Name:  LATONYIA LOPATA  Date of Exam:   01/28/2022 Medical Rec #: [id number]       Accession #:    [id number] Date of Birth: January 28, 1941        Patient Gender: F Patient Age:   55 years Exam Location:  [facility] Procedure:      VAS [place] LOWER EXTREMITY VENOUS (DVT) Referring Phys: Gloriann Loan --------------------------------------------------------------------------------  Indications: Edema, and Swelling.  Comparison Study: no prior Performing Technologist: Archie Patten RVS  Examination Guidelines: A complete evaluation includes B-mode imaging, spectral Doppler, color Doppler, and power Doppler as needed of all accessible portions of each vessel. Bilateral testing is considered an integral part of a complete examination. Limited examinations for reoccurring indications may be performed as noted. The reflux portion of the exam is performed with the patient in reverse Trendelenburg.   +--------+---------------+---------+-----------+----------+--------------------+ RIGHT   CompressibilityPhasicitySpontaneityPropertiesThrombus Aging       +--------+---------------+---------+-----------+----------+--------------------+ CFV     Full           Yes      Yes                                       +--------+---------------+---------+-----------+----------+--------------------+ SFJ     Full                                                              +--------+---------------+---------+-----------+----------+--------------------+ FV Prox Full                                                              +--------+---------------+---------+-----------+----------+--------------------+ FV Mid  Full                                                              +--------+---------------+---------+-----------+----------+--------------------+ FV      Full                                                              Distal                                                                    +--------+---------------+---------+-----------+----------+--------------------+  PFV     Full                                                              +--------+---------------+---------+-----------+----------+--------------------+ POP     Full           Yes      Yes                                       +--------+---------------+---------+-----------+----------+--------------------+ PTV     Full                                                              +--------+---------------+---------+-----------+----------+--------------------+ PERO    None                                         in a single paired                                                        vein                 +--------+---------------+---------+-----------+----------+--------------------+   +---------+---------------+---------+-----------+----------+--------------+ LEFT      CompressibilityPhasicitySpontaneityPropertiesThrombus Aging +---------+---------------+---------+-----------+----------+--------------+ CFV      Full           Yes      Yes                                 +---------+---------------+---------+-----------+----------+--------------+ SFJ      Full                                                        +---------+---------------+---------+-----------+----------+--------------+ FV Prox  Full                                                        +---------+---------------+---------+-----------+----------+--------------+ FV Mid   Full                                                        +---------+---------------+---------+-----------+----------+--------------+ FV DistalFull                                                        +---------+---------------+---------+-----------+----------+--------------+  PFV      Full                                                        +---------+---------------+---------+-----------+----------+--------------+ POP      Full           Yes      Yes                                 +---------+---------------+---------+-----------+----------+--------------+ PTV      Full                                                        +---------+---------------+---------+-----------+----------+--------------+ PERO     Full                                                        +---------+---------------+---------+-----------+----------+--------------+     Summary: RIGHT: - 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 - A cystic structure is found in the popliteal fossa.  LEFT: - There is no evidence of deep vein thrombosis in the lower extremity.  - No cystic structure found in the popliteal fossa.  *See table(s) above for measurements and observations. Electronically signed by Deitra Mayo MD on 01/28/2022 at 4:00:42 PM.     Final    DG Pelvis Portable  Result Date: 01/23/2022 CLINICAL DATA:  Postop left hip arthroplasty. EXAM: PORTABLE PELVIS 1-2 VIEWS COMPARISON:  Left hip radiographs 10/06/2021 FINDINGS: Sequelae of interval left total hip arthroplasty are identified. Gas is noted in the adjacent soft tissues. No acute fracture or dislocation is evident on this single image. There is mild superior hip joint space narrowing on the right. IMPRESSION: Left total hip arthroplasty without evidence of acute osseous abnormality. Electronically Signed   By: Logan Bores M.D.   On: 01/23/2022 10:43   DG HIP UNILAT WITH PELVIS 1V LEFT  Result Date: 01/23/2022 CLINICAL DATA:  Fluoroscopy provided during hip replacement. Fluoroscopy time: 24 seconds. Cumulative air kerma: 2.49 mGy EXAM: DG HIP (WITH OR WITHOUT PELVIS) 1V*L* COMPARISON:  None Available. FINDINGS: Four images were obtained during left hip replacement. By the end of the study, the left hip is been replaced in hardware is in good position on frontal imaging. IMPRESSION: Fluoroscopy provided during left hip replacement as above. Electronically Signed   By: Dorise Bullion III M.D.   On: 01/23/2022 09:07   DG C-Arm 1-60 Min-No Report  Result Date: 01/23/2022 Fluoroscopy was utilized by the requesting physician.  No radiographic interpretation.   DG C-Arm 1-60 Min-No Report  Result Date: 01/23/2022 Fluoroscopy was utilized by the requesting physician.  No radiographic interpretation.    Assessment/Plan: Acute pulmonary embolism ([place]) Hospitalized 02/06/22-02/11/22 for acute PE, on Eliquis now, held Golimumab for 2 weeks until f/u with orthopedic surgeon  Primary osteoarthritis of left hip 01/23/22 s/p total replacement of L hip, f/u [name] 02/07/22,  on perioperative antibiotics from admission on ward-Cefadroxil. Was on Lovenox for 28 days. Prn Percocet, Methocarbamol  COPD GOLD I prn Albuterol ACT, Stiolto, Triamcinolone nasal spray, O2 2lpm  BRBPR (bright  red blood per rectum) Hydrocortisone cream, avoid constipation  Urinary retention Foley, f/u Urology, voiding trial  DVT (deep venous thrombosis) ([place])  01/28/22 [place] age indeterminate deep vein thrombosis  involving the right peroneal veins, on Eliquis  Essential hypertension Blood pressure is controlled,  takes Amlodipine, Metoprolol, HCTZ  Dyslipidemia, goal LDL below 70 takes Atorvastatin, LDL 74 09/07/20  Vitamin D deficiency  takes Vit D  Acute blood loss anemia (ABLA) , take Vit B12, Hgb 7.8 02/10/22, repeat CBC/diff, update Iron, Vit B12, Folate, Ferritin, TIBC  Slow transit constipation Stable, takes Colace, MiraLax, Senokot S  GERD (gastroesophageal reflux disease) Stable,  takes Famotidine, Pantoprazole  Rheumatoid arthritis (HCC) takes Golimumab(on hold for 2 weeks), Leflunomide, fx of knees, hands, wrists, cervical disc disease/fracture, takes Gabapentin,   Hypothyroidism  takes Levothyroxine, TSH 2.35 06/21/21  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 S/p aneurysm repair  CKD (chronic kidney disease) stage 4, GFR 15-29 ml/min (HCC) Bun/creat 35/1.91 02/08/22,  followed by Nephrology, repeat CMP/eGFR   TIA (transient ischemic attack) on Atorvastatin    Family/ staff Communication: plan of care reviewed with the patient and charge nurse.   Labs/tests ordered: CBC/diff, CMP/eGFR, Iron, Ferritin, TIBC, Vit B12, Folate  Time spend 35 minutes.

## 2022-02-14 ENCOUNTER — Non-Acute Institutional Stay (SKILLED_NURSING_FACILITY): Payer: Medicare PPO | Admitting: Family Medicine

## 2022-02-14 ENCOUNTER — Encounter: Payer: Self-pay | Admitting: Family Medicine

## 2022-02-14 DIAGNOSIS — I2699 Other pulmonary embolism without acute cor pulmonale: Secondary | ICD-10-CM

## 2022-02-14 DIAGNOSIS — I10 Essential (primary) hypertension: Secondary | ICD-10-CM | POA: Diagnosis not present

## 2022-02-14 DIAGNOSIS — N184 Chronic kidney disease, stage 4 (severe): Secondary | ICD-10-CM | POA: Diagnosis not present

## 2022-02-14 DIAGNOSIS — J449 Chronic obstructive pulmonary disease, unspecified: Secondary | ICD-10-CM

## 2022-02-14 DIAGNOSIS — I700 Atherosclerosis of aorta: Secondary | ICD-10-CM

## 2022-02-14 LAB — COMPREHENSIVE METABOLIC PANEL
Albumin: 3.1 — AB (ref 3.5–5.0)
Calcium: 8.8 (ref 8.7–10.7)
Globulin: 2.5
eGFR: 23

## 2022-02-14 LAB — HEPATIC FUNCTION PANEL
ALT: 18 U/L (ref 7–35)
AST: 28 (ref 13–35)
Alkaline Phosphatase: 108 (ref 25–125)
Bilirubin, Total: 0.4

## 2022-02-14 LAB — BASIC METABOLIC PANEL
BUN: 22 — AB (ref 4–21)
CO2: 20 (ref 13–22)
CO2: 20 (ref 13–22)
Chloride: 103 (ref 99–108)
Chloride: 103 (ref 99–108)
Creatinine: 2.1 — AB (ref 0.5–1.1)
Potassium: 4.3 mEq/L (ref 3.5–5.1)
Potassium: 4.3 mEq/L (ref 3.5–5.1)
Sodium: 138 (ref 137–147)

## 2022-02-14 LAB — CBC: RBC: 2.58 — AB (ref 3.87–5.11)

## 2022-02-14 LAB — CBC AND DIFFERENTIAL
HCT: 25 — AB (ref 36–46)
Hemoglobin: 8.1 — AB (ref 12.0–16.0)
WBC: 8.5

## 2022-02-14 LAB — VITAMIN B12: Vitamin B-12: 2000

## 2022-02-14 NOTE — Progress Notes (Signed)
Provider:  Alain Honey, MD Location:      Place of Service:     PCP: Cassandria Anger, MD Patient Care Team: Cassandria Anger, MD as PCP - General (Internal Medicine) Leonie Man, MD as PCP - Cardiology (Cardiology) Leonie Man, MD as Consulting Physician (Cardiology) Gavin Pound, MD as Consulting Physician (Rheumatology) Armbruster, Carlota Raspberry, MD as Consulting Physician (Gastroenterology) Tanda Rockers, MD as Consulting Physician (Pulmonary Disease) Grace Isaac, MD (Inactive) as Consulting Physician (Cardiothoracic Surgery) Aloha Gell, MD as Consulting Physician (Obstetrics and Gynecology) Plotnikov, [name], MD  Extended Emergency Contact Information Primary Emergency Contact: [name] Address: [address]           [place], [place] [address] [place] Phone: [phone] Mobile Phone: [phone] Relation: Spouse Secondary Emergency Contact: [name],[name] Address: [address] [place] [address] [place] of [place] Phone: [phone] Relation: Friend  Code Status:  Goals of Care: Advanced Directive information    02/07/2022    8:13 AM  Advanced Directives  Does Patient Have a Medical Advance Directive? No      No chief complaint on file.   HPI: Patient is a 81 y.o. female seen today for admission to [facility], brief hospital stay where she was found to have acute pulmonary embolism.  She was started on Eliquis.  She had just previously been admitted here after a left hip arthroplasty.  She developed symptoms, increased drowsiness fatigue and shortness of breath and was sent to the hospital for evaluation.  IV heparin was initiated but then transition to oral Eliquis I called up with her in the hall today with several friends as she was going to the dining room using her walker..  She appeared much brighter, in no pain and plan to be with friends.  She  denied any shortness of breath or chest pain.  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 Most likely scar. Mild subendocardial ischemia. EF 71% LOW RISK.    Cervical disc disease    fracture   Chronic kidney disease    COPD (chronic obstructive pulmonary disease) (HCC)    Diverticulosis    Hyperlipidemia    Hypertension    Hypothyroidism (acquired)    hypo   Myocardial infarction ([place]) 11/2014   TIA   Polymyalgia rheumatica ([place])    2011 Dr. Charlestine Night   Rheumatoid arthritis [facility]) 2011   Dr.Truslow; fracture knees, hands and wrists -    S/P CABG x 1 1996   CABG--LIMA-LAD for ostial LAD (not felt to be PCI amenable). EF NORMAL then; LIMA now atretic   Shortness of breath dyspnea    with exertion   Stroke ([place]) 11/2014  TIA    Urinary frequency    Past Surgical History:  Procedure Laterality Date   ABDOMINAL AORTIC ANEURYSM REPAIR  1610   Complicated by mesenteric artery stenosis and splenic artery infarction with acquired Asplenia   APPENDECTOMY     BUNIONECTOMY  07/2011   right foot   CARDIAC CATHETERIZATION  2005   (Most recent CATH) - ostial LAD lesion 20-30% (down from 90% initially). Atretic LIMA. Minimal disease the RCA and Circumflex system.   CARPAL TUNNEL RELEASE Left    CATARACT EXTRACTION Bilateral    CERVICAL SPINE SURGERY     plate 2008 Dr. Saintclair Halsted    COLONOSCOPY     CORONARY ARTERY BYPASS GRAFT  1996   INCLUDED AN INTERNAL MAMMARY ARTERY TO THE LAD. EF WAS NORMAL   INGUINAL HERNIA REPAIR Right    LAPAROSCOPIC APPENDECTOMY N/A 06/28/2016   Procedure: APPENDECTOMY LAPAROSCOPIC;  Surgeon: Leighton Ruff, MD;  Location: [facility];  Service: General;  Laterality: N/A;   NM MYOVIEW LTD  06/2010   Fixed anteroseptal, apical and inferoapical defect with moderate size. Most likely scar. Mild subendocardial ischemia. EF 71% LOW RISK.    SPLENECTOMY     TOTAL HIP ARTHROPLASTY Left 01/23/2022   Procedure: LEFT TOTAL HIP ARTHROPLASTY ANTERIOR APPROACH;  Surgeon: Leandrew Koyanagi, MD;  Location: [place];  Service: Orthopedics;  Laterality: Left;  3-C   TRANSTHORACIC ECHOCARDIOGRAM  12/2014   [facility]: Normal LV size & function. EF 55-60%,    vagina polyp     VIDEO ASSISTED THORACOSCOPY (VATS)/WEDGE RESECTION Left 03/02/2015   Procedure: VIDEO ASSISTED THORACOSCOPY (VATS), MINI THORACOTOMY, LEFT UPPER LOBE WEDGE, TAKE DOWN OF INTERNAL MAMMARY LESIONS, PLACEMENT OF ON-Q PUMP;  Surgeon: Grace Isaac, MD;  Location: [place];  Service: Thoracic;  Laterality: Left;   VIDEO BRONCHOSCOPY N/A 03/02/2015   Procedure: BRONCHOSCOPY;  Surgeon: Grace Isaac, MD;  Location: [place];  Service: Thoracic;  Laterality: N/A;   VIDEO BRONCHOSCOPY WITH ENDOBRONCHIAL NAVIGATION N/A 10/08/2017   Procedure: VIDEO BRONCHOSCOPY WITH ENDOBRONCHIAL NAVIGATION WITH BIOPSIES OF LEFT UPPER LOBE AND LEFT LOWER LOBE;  Surgeon: Grace Isaac, MD;  Location: [place];  Service: Thoracic;  Laterality: N/A;    reports that she quit smoking about 63 years ago. Her smoking use included cigarettes. She has never been exposed to tobacco smoke. She has never used smokeless tobacco. She reports current alcohol use. She reports that she does not use drugs. Social History   Socioeconomic History   Marital status: Married    Spouse name: Not on file   Number of children: 4   Years of  education: Not on file   Highest education level: Not on file  Occupational History   Occupation: retired    Fish farm manager: RETIRED  Tobacco Use   Smoking status: Former    Types: Cigarettes    Quit date: 12/04/1958    Years since quitting: 63.2    Passive exposure: Never   Smokeless tobacco: Never  Vaping Use   Vaping Use: Never used  Substance and Sexual Activity   Alcohol use: Yes    Comment: hardly ever   Drug use: No   Sexual activity: Not Currently  Other Topics Concern   Not on file  Social History Narrative   [facility]  [place] ; [address] [place].    Living at [place] since dec 2015   Social Determinants of Health   Financial Resource Strain: Low Risk  (  05/30/2021)   Overall Financial Resource Strain (CARDIA)    Difficulty of Paying Living Expenses: Not hard at all  Food Insecurity: No Food Insecurity (01/23/2022)   Hunger Vital Sign    Worried About Running Out of Food in the Last Year: Never true    Ran Out of Food in the Last Year: Never true  Transportation Needs: No Transportation Needs (01/23/2022)   PRAPARE - Hydrologist (Medical): No    Lack of Transportation (Non-Medical): No  Physical Activity: Insufficiently Active (05/30/2021)   Exercise Vital Sign    Days of Exercise per Week: 3 days    Minutes of Exercise per Session: 30 min  Stress: No Stress Concern Present (05/30/2021)   [place]    Feeling of Stress : Not at all  Social Connections: Moderately Integrated (05/30/2021)   Social Connection and Isolation Panel [NHANES]    Frequency of Communication with Friends and Family: Twice a week    Frequency of Social Gatherings with Friends and Family: Twice a week    Attends Religious Services: Never    Marine scientist or Organizations: Yes    Attends Music therapist: More than 4 times per year     Marital Status: Married  Human resources officer Violence: Not At Risk (01/23/2022)   Humiliation, Afraid, Rape, and Kick questionnaire    Fear of Current or Ex-Partner: No    Emotionally Abused: No    Physically Abused: No    Sexually Abused: No    Functional Status Survey:    Family History  Problem Relation Age of Onset   Hypertension Mother    Diabetes Mother    Heart disease Mother    Hyperlipidemia Mother    Stroke Father    Hyperlipidemia Sister    Hypertension Sister    Hypertension Daughter    Cancer Paternal Uncle        Deceased from cancer not sure of site   Hypertension Son    Hyperlipidemia Son    Hyperlipidemia Son    Hypertension Son    Hyperlipidemia Son    Hypertension Son    Hypertension Other    Coronary artery disease Other    Asthma Neg Hx    Colon cancer Neg Hx     Health Maintenance  Topic Date Due   COLONOSCOPY (Pts 45-17yr Insurance coverage will need to be confirmed)  03/30/2019   COVID-19 Vaccine (5 - Mixed Product risk series) 12/07/2021   TETANUS/TDAP  06/01/2026   Pneumonia Vaccine 81 Years old  Completed   INFLUENZA VACCINE  Completed   DEXA SCAN  Completed   Zoster Vaccines- Shingrix  Completed   HPV VACCINES  Aged Out    Allergies  Allergen Reactions   Nsaids Other (See Comments)    Pt states all NSAIDS does not sit well onstomach   Aspirin Other (See Comments)    Hurts stomach   Codeine Nausea And Vomiting   Nitroglycerin Other (See Comments)    Heart rate drops    Outpatient Encounter Medications as of 02/14/2022  Medication Sig   albuterol (VENTOLIN HFA) 108 (90 Base) MCG/ACT inhaler Inhale 1-2 puffs into the lungs every 4 (four) hours as needed for wheezing or shortness of breath.   amLODipine (NORVASC) 2.5 MG tablet Take 1 tablet (2.5 mg total) by mouth daily.   apixaban (ELIQUIS) 5 MG TABS tablet Take 2 tablets (10 mg total) by  mouth 2 (two) times daily for 4 days, THEN 1 tablet (5 mg total) 2 (two) times daily.    atorvastatin (LIPITOR) 80 MG tablet Take 80 mg by mouth daily.   Cholecalciferol (VITAMIN D3) 50 MCG (2000 UT) capsule Take 1 capsule (2,000 Units total) by mouth daily.   Cyanocobalamin 2500 MCG TABS Take 2,500 mcg by mouth daily.   docusate sodium (COLACE) 100 MG capsule Take 100 mg by mouth 2 (two) times daily.   famotidine (PEPCID) 20 MG tablet Take 20 mg by mouth 2 (two) times daily.   gabapentin (NEURONTIN) 300 MG capsule Take 1 capsule (300 mg total) by mouth at bedtime.   Golimumab 50 MG/0.5ML SOAJ Take 50 mg by mouth every 2 (two) months. Hold for the next 2 weeks per Orthopedic surgery recommendations.   hydrochlorothiazide (HYDRODIURIL) 12.5 MG tablet TAKE 1 TABLET BY MOUTH EVERY DAY (Patient taking differently: Take 12.5 mg by mouth daily.)   hydrocortisone (ANUSOL-HC) 2.5 % rectal cream Place rectally 2 (two) times daily.   leflunomide (ARAVA) 20 MG tablet Take 20 mg by mouth daily.    levothyroxine (SYNTHROID) 88 MCG tablet TAKE 1 TABLET (88 MCG TOTAL) BY MOUTH DAILY BEFORE BREAKFAST.   metoprolol tartrate (LOPRESSOR) 50 MG tablet Take 50 mg by mouth 2 (two) times daily.   Multiple Vitamins-Minerals (CENTRUM ADULT PO) Take 1 tablet by mouth daily. 1 Tablet Daily.   pantoprazole (PROTONIX) 40 MG tablet Take 40 mg by mouth daily.   tamsulosin (FLOMAX) 0.4 MG CAPS capsule Take 1 capsule (0.4 mg total) by mouth daily.   Tiotropium Bromide-Olodaterol (STIOLTO RESPIMAT) 2.5-2.5 MCG/ACT AERS Inhale 2 each into the lungs at bedtime.   triamcinolone (NASACORT) 55 MCG/ACT AERO nasal inhaler Place 1 spray into the nose daily.   No facility-administered encounter medications on file as of 02/14/2022.    Review of Systems  Constitutional:  Positive for fatigue.  HENT: Negative.    Respiratory:  Positive for shortness of breath.   Cardiovascular: Negative.   Gastrointestinal:  Positive for abdominal distention.  Genitourinary:  Positive for difficulty urinating.  Musculoskeletal:   Positive for gait problem.  Neurological:  Positive for weakness.  Psychiatric/Behavioral: Negative.    All other systems reviewed and are negative.   There were no vitals filed for this visit. There is no height or weight on file to calculate BMI. Physical Exam Vitals and nursing note reviewed.  Constitutional:      Appearance: Normal appearance.  HENT:     Mouth/Throat:     Mouth: Mucous membranes are moist.     Pharynx: Oropharynx is clear.  Eyes:     Extraocular Movements: Extraocular movements intact.     Pupils: Pupils are equal, round, and reactive to light.  Cardiovascular:     Rate and Rhythm: Normal rate and regular rhythm.  Pulmonary:     Effort: Pulmonary effort is normal.     Breath sounds: Normal breath sounds.  Abdominal:     General: Bowel sounds are normal.     Tenderness: There is no abdominal tenderness.  Musculoskeletal:     Comments: Uses walker for ambulation.  PT is progressing  Skin:    General: Skin is warm and dry.  Neurological:     General: No focal deficit present.     Mental Status: She is alert and oriented to person, place, and time.  Psychiatric:        Mood and Affect: Mood normal.  Behavior: Behavior normal.     Labs reviewed: Basic Metabolic Panel: Recent Labs    02/06/22 1529 02/07/22 0315 02/08/22 0606  NA 133* 134* 134*  K 4.3 3.7 3.8  CL 98 102 100  CO2 20* 19* 20*  GLUCOSE 122* 89 117*  BUN 33* 30* 35*  CREATININE 1.89* 1.90* 1.91*  CALCIUM 8.2* 7.9* 8.7*  MG 1.3*  --   --    Liver Function Tests: Recent Labs    01/03/22 0952 02/06/22 1529 02/07/22 0315  AST 19 63* 48*  ALT 12 36 29  ALKPHOS 72 123 110  BILITOT 0.6 0.5 0.7  PROT 6.9 6.5 5.2*  ALBUMIN 3.8 2.4* 2.1*   No results for input(s): "LIPASE", "AMYLASE" in the last 8760 hours. No results for input(s): "AMMONIA" in the last 8760 hours. CBC: Recent Labs    12/06/21 0948 01/03/22 0952 01/23/22 0644 02/08/22 0606 02/09/22 0037  02/10/22 0143  WBC 8.5 8.6   < > 11.1* 9.0 9.1  NEUTROABS 3.6 4.0  --   --   --   --   HGB 11.0* 12.2   < > 7.9* 7.5* 7.8*  HCT 32.6* 37.1   < > 22.7* 21.9* 22.8*  MCV 97.3 99.0   < > 93.4 94.4 95.0  PLT 314.0 349.0   < > 386 404* 444*   < > = values in this interval not displayed.   Cardiac Enzymes: No results for input(s): "CKTOTAL", "CKMB", "CKMBINDEX", "TROPONINI" in the last 8760 hours. BNP: Invalid input(s): "POCBNP" No results found for: "HGBA1C" Lab Results  Component Value Date   TSH 2.35 06/21/2021   Lab Results  Component Value Date   VITAMINB12 524 11/13/2018   Lab Results  Component Value Date   FOLATE 11.5 09/04/2012   Lab Results  Component Value Date   IRON 99 12/06/2021   TIBC 310 12/06/2021   FERRITIN 86 12/06/2021    Imaging and Procedures obtained prior to SNF admission: VAS [place] LOWER EXTREMITY VENOUS (DVT)  Result Date: 02/07/2022  Lower Venous DVT Study Patient Name:  NAHIA NISSAN  Date of Exam:   02/07/2022 Medical Rec #: [id number]       Accession #:    [id number] Date of Birth: September 30, 1940        Patient Gender: F Patient Age:   63 years Exam Location:  [facility] Procedure:      VAS [place] LOWER EXTREMITY VENOUS (DVT) Referring Phys: Sheppard Coil MELVIN --------------------------------------------------------------------------------  Indications: Edema.  Limitations: Pain tolerance. Comparison Study: 01/28/22 prior Performing Technologist: Archie Patten RVS  Examination Guidelines: A complete evaluation includes B-mode imaging, spectral Doppler, color Doppler, and power Doppler as needed of all accessible portions of each vessel. Bilateral testing is considered an integral part of a complete examination. Limited examinations for reoccurring indications may be performed as noted. The reflux portion of the exam is performed with the patient in reverse Trendelenburg.  +---------+---------------+---------+-----------+----------+-------------------+ RIGHT     CompressibilityPhasicitySpontaneityPropertiesThrombus Aging      +---------+---------------+---------+-----------+----------+-------------------+ CFV      Full           Yes      Yes                                      +---------+---------------+---------+-----------+----------+-------------------+ SFJ      Full                                                             +---------+---------------+---------+-----------+----------+-------------------+  FV Prox  Full                                                             +---------+---------------+---------+-----------+----------+-------------------+ FV Mid                  Yes      Yes                  unable to tolerate                                                        compression due to                                                        pain tolerance-                                                           patent by color                                                           doppler             +---------+---------------+---------+-----------+----------+-------------------+ FV Distal               Yes      Yes                  unable to tolerate                                                        compression due to                                                        pain tolerance-                                                           patent by color  doppler             +---------+---------------+---------+-----------+----------+-------------------+ PFV      Full                                                             +---------+---------------+---------+-----------+----------+-------------------+ POP      Full           Yes      Yes                                      +---------+---------------+---------+-----------+----------+-------------------+ PTV       Full                                                             +---------+---------------+---------+-----------+----------+-------------------+ PERO     Full           Yes      Yes                  patent by color                                                           doppler             +---------+---------------+---------+-----------+----------+-------------------+   +---------+---------------+---------+-----------+----------+-------------------+ LEFT     CompressibilityPhasicitySpontaneityPropertiesThrombus Aging      +---------+---------------+---------+-----------+----------+-------------------+ CFV      Full           Yes      Yes                                      +---------+---------------+---------+-----------+----------+-------------------+ SFJ      Full                                                             +---------+---------------+---------+-----------+----------+-------------------+ FV Prox  Full                                                             +---------+---------------+---------+-----------+----------+-------------------+ FV Mid                  Yes      Yes                  unable to tolerate  compression due to                                                        pain tolerance-                                                           patent by color                                                           doppler             +---------+---------------+---------+-----------+----------+-------------------+ FV Distal               Yes      Yes                  unable to tolerate                                                        compression due to                                                        pain tolerance-                                                           patent by color                                                            doppler             +---------+---------------+---------+-----------+----------+-------------------+ PFV      Full                                                             +---------+---------------+---------+-----------+----------+-------------------+ POP      Full           Yes      Yes                                      +---------+---------------+---------+-----------+----------+-------------------+  PTV      Full                                                             +---------+---------------+---------+-----------+----------+-------------------+ PERO     Full                                                             +---------+---------------+---------+-----------+----------+-------------------+     Summary: RIGHT: - There is no evidence of deep vein thrombosis in the lower extremity.  - A cystic structure is found in the popliteal fossa.  LEFT: - There is no evidence of deep vein thrombosis in the lower extremity.  - No cystic structure found in the popliteal fossa.  *See table(s) above for measurements and observations. Electronically signed by Deitra Mayo MD on 02/07/2022 at 6:17:09 PM.    Final    ECHOCARDIOGRAM COMPLETE  Result Date: 02/07/2022    ECHOCARDIOGRAM REPORT   Patient Name:   JACQUE BYRON Date of Exam: 02/07/2022 Medical Rec #:  [id number]      Height:       63.0 in Accession #:    [id number]     Weight:       158.7 lb Date of Birth:  12-19-1940       BSA:          1.753 m Patient Age:    73 years       BP:           120/41 mmHg Patient Gender: F              HR:           79 bpm. Exam Location:  Inpatient Procedure: 2D Echo, 3D Echo, Cardiac Doppler and Color Doppler Indications:    Pulmonary Embolus I26.09  History:        Patient has prior history of Echocardiogram examinations, most                 recent 03/10/2019. CAD, Prior CABG, COPD and TIA; Risk                 Factors:Hypertension.  History of AAA repair, GERD, GERD, Chronic                 kidney disease. Mesenteric artery stenosis.  Sonographer:    Darlina Sicilian RDCS Referring Phys: [id number] [place]  1. Left ventricular ejection fraction, by estimation, is 65 to 70%. Left ventricular ejection fraction by 3D volume is 68 %. The left ventricle has normal function. The left ventricle has no regional wall motion abnormalities. There is severe asymmetric  left ventricular hypertrophy. Left ventricular diastolic parameters are consistent with Grade II diastolic dysfunction (pseudonormalization).  2. Right ventricular systolic function is normal. The right ventricular size is normal. There is normal pulmonary artery systolic pressure.  3. The mitral valve is normal in structure. Trivial mitral valve regurgitation. No evidence of mitral stenosis.  4. The aortic valve is tricuspid. There is mild thickening of the aortic valve. Aortic valve regurgitation is not visualized. Comparison(s): Prior images  reviewed side by side. MR has improved. FINDINGS  Left Ventricle: Left ventricular ejection fraction, by estimation, is 65 to 70%. Left ventricular ejection fraction by 3D volume is 68 %. The left ventricle has normal function. The left ventricle has no regional wall motion abnormalities. The left ventricular internal cavity size was small. There is severe asymmetric left ventricular hypertrophy. Left ventricular diastolic parameters are consistent with Grade II diastolic dysfunction (pseudonormalization). Right Ventricle: The right ventricular size is normal. No increase in right ventricular wall thickness. Right ventricular systolic function is normal. There is normal pulmonary artery systolic pressure. The tricuspid regurgitant velocity is 2.16 m/s, and  with an assumed right atrial pressure of 3 mmHg, the estimated right ventricular systolic pressure is 38.7 mmHg. Left Atrium: Left atrial size was normal in size. Right Atrium:  Right atrial size was normal in size. Pericardium: There is no evidence of pericardial effusion. Presence of epicardial fat layer. Mitral Valve: The mitral valve is normal in structure. Trivial mitral valve regurgitation. No evidence of mitral valve stenosis. Tricuspid Valve: The tricuspid valve is normal in structure. Tricuspid valve regurgitation is mild . No evidence of tricuspid stenosis. Aortic Valve: The aortic valve is tricuspid. There is mild thickening of the aortic valve. Aortic valve regurgitation is not visualized. Pulmonic Valve: The pulmonic valve was normal in structure. Pulmonic valve regurgitation is mild. No evidence of pulmonic stenosis. Aorta: The aortic root and ascending aorta are structurally normal, with no evidence of dilitation. IAS/Shunts: No atrial level shunt detected by color flow Doppler.  LEFT VENTRICLE PLAX 2D LVIDd:         3.60 cm         Diastology LVIDs:         2.30 cm         LV e' medial:    4.05 cm/s LV PW:         1.10 cm         LV E/e' medial:  17.2 LV IVS:        1.50 cm         LV e' lateral:   4.87 cm/s LVOT diam:     2.40 cm         LV E/e' lateral: 14.3 LV SV:         55 LV SV Index:   31 LVOT Area:     4.52 cm        3D Volume EF                                LV 3D EF:    Left                                             ventricul                                             ar                                             ejection  fraction                                             by 3D                                             volume is                                             68 %.                                 3D Volume EF:                                3D EF:        68 %                                LV EDV:       96 ml                                LV ESV:       30 ml                                LV SV:        66 ml RIGHT VENTRICLE RV Basal diam:  3.80 cm RV Mid diam:    2.00 cm RV S prime:     12.90 cm/s  TAPSE (M-mode): 1.4 cm LEFT ATRIUM             Index        RIGHT ATRIUM           Index LA diam:        4.00 cm 2.28 cm/m   RA Area:     13.60 cm LA Vol (A2C):   55.7 ml 31.78 ml/m  RA Volume:   33.30 ml  19.00 ml/m LA Vol (A4C):   33.5 ml 19.11 ml/m LA Biplane Vol: 43.5 ml 24.82 ml/m  AORTIC VALVE LVOT Vmax:   72.40 cm/s LVOT Vmean:  48.500 cm/s LVOT VTI:    0.122 m  AORTA Ao Root diam: 3.20 cm Ao Asc diam:  2.80 cm MITRAL VALVE               TRICUSPID VALVE MV Area (PHT): 3.43 cm    TR Peak grad:   18.7 mmHg MV Decel Time: 221 msec    TR Vmax:        216.00 cm/s MV E velocity: 69.80 cm/s MV A velocity: 69.40 cm/s  SHUNTS MV E/A ratio:  1.01        Systemic VTI:  0.12 m  Systemic Diam: 2.40 cm Rudean Haskell MD Electronically signed by Rudean Haskell MD Signature Date/Time: 02/07/2022/1:27:00 PM    Final    CT Angio Chest PE W and/or Wo Contrast  Result Date: 02/06/2022 CLINICAL DATA:  Provided history: Pulmonary embolism (PE) suspected, high prob reduce dose contrast Dural venous and constipation. Recent hip replacement. EXAM: CT ANGIOGRAPHY CHEST WITH CONTRAST TECHNIQUE: Multidetector CT imaging of the chest was performed using the standard protocol during bolus administration of intravenous contrast. Multiplanar CT image reconstructions and MIPs were obtained to evaluate the vascular anatomy. RADIATION DOSE REDUCTION: This exam was performed according to the departmental dose-optimization program which includes automated exposure control, adjustment of the mA and/or kV according to patient size and/or use of iterative reconstruction technique. CONTRAST:  80 cc Omnipaque 350 IV COMPARISON:  Radiograph earlier today. Chest CT 03/17/2021. FINDINGS: Cardiovascular: Isolated subsegmental embolus in the medial right middle lobe pulmonary artery, for example series 9, image 171 and series 10, image 58. No additional pulmonary emboli. Calcified and noncalcified  atheromatous plaque throughout the thoracic aorta. No aneurysm or dissection. Post CABG. Upper normal heart size. No pericardial effusion. Mediastinum/Nodes: No enlarged mediastinal or hilar lymph nodes. No visible thyroid nodule. Tiny hiatal hernia. Lungs/Pleura: Small right and trace left pleural effusion. Associated atelectasis. The previous subpleural right lower lobe nodule is obscured on the current exam due to pleural fluid and atelectasis. Postsurgical change in the left upper lobe. There is mild bronchial thickening in the lower lobes. Upper Abdomen: No acute upper abdominal findings. Elongated left lobe of the liver. Musculoskeletal: Stable punctate sclerotic focus within T4 vertebral body likely a bone island. Median sternotomy. No acute osseous findings. Review of the MIP images confirms the above findings. IMPRESSION: 1. Isolated subsegmental embolus in the medial right middle lobe pulmonary artery. 2. Small right and trace left pleural effusion. Associated atelectasis. 3. Mild bronchial thickening in the lower lobes. Aortic Atherosclerosis (ICD10-I70.0). Critical Value/emergent results were called by telephone at the time of interpretation on 02/06/2022 at 10:25 pm to provider DAVID YAO , who verbally acknowledged these results. Electronically Signed   By: Keith Rake M.D.   On: 02/06/2022 22:26   CT Head Wo Contrast  Result Date: 02/06/2022 CLINICAL DATA:  Mental status change, unknown cause EXAM: CT HEAD WITHOUT CONTRAST TECHNIQUE: Contiguous axial images were obtained from the base of the skull through the vertex without intravenous contrast. RADIATION DOSE REDUCTION: This exam was performed according to the departmental dose-optimization program which includes automated exposure control, adjustment of the mA and/or kV according to patient size and/or use of iterative reconstruction technique. COMPARISON:  02/15/2020 FINDINGS: Brain: No evidence of acute infarction, hemorrhage, mass, mass  effect, or midline shift. No hydrocephalus or extra-axial fluid collection. Mildly advanced cerebral atrophy for age. Vascular: No hyperdense vessel. Skull: Normal. Negative for fracture or focal lesion. Sinuses/Orbits: No acute finding. Status post bilateral lens replacements. Other: The mastoid air cells are well aerated. IMPRESSION: No acute intracranial process. Electronically Signed   By: Merilyn Baba M.D.   On: 02/06/2022 14:50   DG Chest 2 View  Result Date: 02/06/2022 CLINICAL DATA:  Cough EXAM: CHEST - 2 VIEW COMPARISON:  Chest x-ray dated Sep 26, 2019 FINDINGS: Cardiac and mediastinal contours are within normal limits. Prior median sternotomy and CABG. Small bilateral pleural effusions and bibasilar atelectasis. No evidence of pneumothorax. IMPRESSION: Small bilateral pleural effusions and bibasilar atelectasis. Electronically Signed   By: Yetta Glassman M.D.   On: 02/06/2022 14:49  Assessment/Plan 1. Other acute pulmonary embolism, unspecified whether acute cor pulmonale present ([place]) Continue Eliquis indefinitely  2. Aortic atherosclerosis (HCC) Asymptomatic.  Patient continue with statin  3. CKD (chronic kidney disease) stage 4, GFR 15-29 ml/min (HCC) She is followed by nephrology.  Has been told she may be heading toward dialysis  4. COPD GOLD I Followed by pulmonology.  Continue inhalers.  5. Essential hypertension Blood pressure controlled on combination losartan and amlodipine, HCTZ    Family/ staff Communication:   Labs/tests ordered:  Lillette Boxer. [place], [place] [address] [place], [place] Office [phone]

## 2022-02-15 ENCOUNTER — Non-Acute Institutional Stay (SKILLED_NURSING_FACILITY): Payer: Medicare PPO | Admitting: Adult Health

## 2022-02-15 ENCOUNTER — Encounter: Payer: Self-pay | Admitting: Nurse Practitioner

## 2022-02-15 DIAGNOSIS — R338 Other retention of urine: Secondary | ICD-10-CM | POA: Diagnosis not present

## 2022-02-15 DIAGNOSIS — Z96642 Presence of left artificial hip joint: Secondary | ICD-10-CM | POA: Diagnosis not present

## 2022-02-15 DIAGNOSIS — D62 Acute posthemorrhagic anemia: Secondary | ICD-10-CM

## 2022-02-15 DIAGNOSIS — K5901 Slow transit constipation: Secondary | ICD-10-CM | POA: Diagnosis not present

## 2022-02-15 DIAGNOSIS — I2699 Other pulmonary embolism without acute cor pulmonale: Secondary | ICD-10-CM

## 2022-02-15 NOTE — Progress Notes (Unsigned)
Location:  [facility] Room Number: NO/65/A Place of Service:  SNF (31) Provider:  Kenard Gower, DNP, FNP-BC  Patient Care Team: [name], MD as PCP - General (Internal Medicine) [name], MD as PCP - Cardiology (Cardiology) [name], MD as Consulting Physician (Cardiology) [name], MD as Consulting Physician (Rheumatology) Armbruster, [name], MD as Consulting Physician (Gastroenterology) [name], MD as Consulting Physician (Pulmonary Disease) [name], MD (Inactive) as Consulting Physician (Cardiothoracic Surgery) [name], MD as Consulting Physician (Obstetrics and Gynecology) [name], Georgina Quint, MD  Extended Emergency Contact Information Primary Emergency Contact: [name] Address: [address]           [place], [place] [address] [place] Home Phone: [phone] Mobile Phone: [phone] Relation: Spouse Secondary Emergency Contact: [name],Sherry Address: [address], [place] [address] [place] Home Phone: [phone] Relation: Friend  Code Status:  FULL  Goals of care: Advanced Directive information    02/07/2022    8:13 AM  Advanced Directives  Does Patient Have a Medical Advance Directive? No     Chief Complaint  Patient presents with   Acute Visit    Patient is being seen for anemia    HPI:  Pt is a 81 y.o. female seen today for medical management of chronic diseases.  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 Most likely scar. Mild subendocardial ischemia. EF 71% LOW RISK.    Cervical disc disease    fracture   Chronic kidney disease    COPD (chronic obstructive pulmonary disease) (HCC)    Diverticulosis    Hyperlipidemia    Hypertension    Hypothyroidism (acquired)    hypo   Myocardial infarction (HCC) 11/2014   TIA   Polymyalgia rheumatica (HCC)    2011 Dr. Kellie Simmering   Rheumatoid arthritis [name]) 2011   Dr.Truslow; fracture knees, hands and wrists -    S/P CABG x 1 1996   CABG--LIMA-LAD for ostial LAD (not felt to be PCI amenable). EF NORMAL then; LIMA now atretic   Shortness of breath dyspnea    with exertion   Stroke (HCC) 11/2014   TIA    Urinary frequency    Past Surgical History:  Procedure Laterality Date   ABDOMINAL AORTIC ANEURYSM REPAIR  1996   Complicated by mesenteric artery stenosis and splenic artery infarction with acquired Asplenia   APPENDECTOMY     BUNIONECTOMY  07/2011   right foot   CARDIAC CATHETERIZATION  2005   (Most recent CATH) - ostial LAD lesion 20-30% (down from 90% initially). Atretic LIMA. Minimal disease the RCA and Circumflex system.   CARPAL TUNNEL RELEASE Left  CATARACT EXTRACTION Bilateral    CERVICAL SPINE SURGERY     plate 1610 Dr. Wynetta Emery   COLONOSCOPY     CORONARY ARTERY BYPASS GRAFT  1996   INCLUDED AN INTERNAL MAMMARY ARTERY TO THE LAD. EF WAS NORMAL   INGUINAL HERNIA REPAIR Right    LAPAROSCOPIC APPENDECTOMY N/A 06/28/2016   Procedure: APPENDECTOMY LAPAROSCOPIC;  Surgeon: [name], MD;  Location: [facility];  Service: General;  Laterality: N/A;   NM MYOVIEW LTD  06/2010   Fixed anteroseptal, apical and inferoapical defect with moderate size. Most likely scar. Mild subendocardial ischemia. EF 71% LOW  RISK.    SPLENECTOMY     TOTAL HIP ARTHROPLASTY Left 01/23/2022   Procedure: LEFT TOTAL HIP ARTHROPLASTY ANTERIOR APPROACH;  Surgeon: [name], MD;  Location: [facility] OR;  Service: Orthopedics;  Laterality: Left;  3-C   TRANSTHORACIC ECHOCARDIOGRAM  12/2014   [facility]: Normal LV size & function. EF 55-60%,    vagina polyp     VIDEO ASSISTED THORACOSCOPY (VATS)/WEDGE RESECTION Left 03/02/2015   Procedure: VIDEO ASSISTED THORACOSCOPY (VATS), MINI THORACOTOMY, LEFT UPPER LOBE WEDGE, TAKE DOWN OF INTERNAL MAMMARY LESIONS, PLACEMENT OF ON-Q PUMP;  Surgeon: [name], MD;  Location: [facility] OR;  Service: Thoracic;  Laterality: Left;   VIDEO BRONCHOSCOPY N/A 03/02/2015   Procedure: BRONCHOSCOPY;  Surgeon: [name], MD;  Location: [facility] OR;  Service: Thoracic;  Laterality: N/A;   VIDEO BRONCHOSCOPY WITH ENDOBRONCHIAL NAVIGATION N/A 10/08/2017   Procedure: VIDEO BRONCHOSCOPY WITH ENDOBRONCHIAL NAVIGATION WITH BIOPSIES OF LEFT UPPER LOBE AND LEFT LOWER LOBE;  Surgeon: [name], MD;  Location: [facility] OR;  Service: Thoracic;  Laterality: N/A;    Allergies  Allergen Reactions   Nsaids Other (See Comments)    Pt states all NSAIDS does not sit well onstomach   Aspirin Other (See Comments)    Hurts stomach   Codeine Nausea And Vomiting   Nitroglycerin Other (See Comments)    Heart rate drops    Outpatient Encounter Medications as of 02/15/2022  Medication Sig   albuterol (VENTOLIN HFA) 108 (90 Base) MCG/ACT inhaler Inhale 1-2 puffs into the lungs every 4 (four) hours as needed for wheezing or shortness of breath.   amLODipine (NORVASC) 2.5 MG tablet Take 1 tablet (2.5 mg total) by mouth daily.   apixaban (ELIQUIS) 5 MG TABS tablet Take 2 tablets (10 mg total) by mouth 2 (two) times daily for 4 days, THEN 1 tablet (5 mg total) 2 (two) times daily.   atorvastatin (LIPITOR) 80 MG tablet Take 80 mg by mouth daily.   Cholecalciferol (VITAMIN D3) 50 MCG (2000 UT) capsule Take 1  capsule (2,000 Units total) by mouth daily.   Cyanocobalamin 2500 MCG TABS Take 2,500 mcg by mouth daily.   docusate sodium (COLACE) 100 MG capsule Take 100 mg by mouth 2 (two) times daily.   famotidine (PEPCID) 20 MG tablet Take 20 mg by mouth 2 (two) times daily.   gabapentin (NEURONTIN) 300 MG capsule Take 1 capsule (300 mg total) by mouth at bedtime.   Golimumab 50 MG/0.5ML SOAJ Take 50 mg by mouth every 2 (two) months. Hold for the next 2 weeks per Orthopedic surgery recommendations.   hydrochlorothiazide (HYDRODIURIL) 12.5 MG tablet TAKE 1 TABLET BY MOUTH EVERY DAY (Patient taking differently: Take 12.5 mg by mouth daily.)   hydrocortisone (ANUSOL-HC) 2.5 % rectal cream Place rectally 2 (two) times daily.   leflunomide (ARAVA) 20 MG tablet Take 20 mg by mouth daily.  levothyroxine (SYNTHROID) 88 MCG tablet TAKE 1 TABLET (88 MCG TOTAL) BY MOUTH DAILY BEFORE BREAKFAST.   metoprolol tartrate (LOPRESSOR) 50 MG tablet Take 50 mg by mouth 2 (two) times daily.   Multiple Vitamins-Minerals (CENTRUM ADULT PO) Take 1 tablet by mouth daily. 1 Tablet Daily.   pantoprazole (PROTONIX) 40 MG tablet Take 40 mg by mouth daily.   tamsulosin (FLOMAX) 0.4 MG CAPS capsule Take 1 capsule (0.4 mg total) by mouth daily.   Tiotropium Bromide-Olodaterol (STIOLTO RESPIMAT) 2.5-2.5 MCG/ACT AERS Inhale 2 each into the lungs at bedtime.   triamcinolone (NASACORT) 55 MCG/ACT AERO nasal inhaler Place 1 spray into the nose daily.   No facility-administered encounter medications on file as of 02/15/2022.    Review of Systems ***    Immunization History  Administered Date(s) Administered   Fluad Quad(high Dose 65+) 02/03/2021, 01/16/2022   Influenza Whole 05/02/2001, 01/27/2008, 01/12/2010, 12/31/2010, 01/25/2012   Influenza, High Dose Seasonal PF 01/14/2014, 02/13/2015, 12/21/2016, 01/09/2018, 12/29/2018   Influenza,inj,Quad PF,6+ Mos 12/23/2015   Influenza-Unspecified 02/03/2013, 01/13/2014, 02/13/2015,  01/20/2020   Meningococcal Polysaccharide 03/21/2012   Moderna SARS-COV2 Booster Vaccination 03/15/2020   Moderna Sars-Covid-2 Vaccination 06/02/2019, 06/30/2019   PPD Test 11/18/2013   Pfizer Covid-19 Vaccine Bivalent Booster 69yrs & up 01/19/2021, 10/12/2021   Pneumococcal Conjugate-13 03/20/2013   Pneumococcal Polysaccharide-23 05/02/2000, 06/23/2010, 02/26/2018   Td 05/01/2006   Tdap 06/01/2016   Zoster Recombinat (Shingrix) 05/16/2017, 07/20/2017   Zoster, Live 01/17/2006   Pertinent  Health Maintenance Due  Topic Date Due   COLONOSCOPY (Pts 45-29yrs Insurance coverage will need to be confirmed)  03/30/2019   INFLUENZA VACCINE  Completed   DEXA SCAN  Completed      02/08/2022    8:45 PM 02/09/2022   10:00 AM 02/10/2022    4:00 AM 02/10/2022    8:00 AM 02/10/2022    9:04 PM  Fall Risk  Patient Fall Risk Level Moderate fall risk High fall risk High fall risk High fall risk High fall risk     Vitals:   02/15/22 1026  BP: 100/66  Pulse: 96  Resp: 19  Temp: (!) 97.2 F (36.2 C)  SpO2: 91%  Weight: 159 lb (72.1 kg)  Height: 5\' 3"  (1.6 m)   Body mass index is 28.17 kg/m.  Physical Exam     Labs reviewed: Recent Labs    02/06/22 1529 02/07/22 0315 02/08/22 0606  NA 133* 134* 134*  K 4.3 3.7 3.8  CL 98 102 100  CO2 20* 19* 20*  GLUCOSE 122* 89 117*  BUN 33* 30* 35*  CREATININE 1.89* 1.90* 1.91*  CALCIUM 8.2* 7.9* 8.7*  MG 1.3*  --   --    Recent Labs    01/03/22 0952 02/06/22 1529 02/07/22 0315  AST 19 63* 48*  ALT 12 36 29  ALKPHOS 72 123 110  BILITOT 0.6 0.5 0.7  PROT 6.9 6.5 5.2*  ALBUMIN 3.8 2.4* 2.1*   Recent Labs    12/06/21 0948 01/03/22 0952 01/23/22 0644 02/08/22 0606 02/09/22 0037 02/10/22 0143  WBC 8.5 8.6   < > 11.1* 9.0 9.1  NEUTROABS 3.6 4.0  --   --   --   --   HGB 11.0* 12.2   < > 7.9* 7.5* 7.8*  HCT 32.6* 37.1   < > 22.7* 21.9* 22.8*  MCV 97.3 99.0   < > 93.4 94.4 95.0  PLT 314.0 349.0   < > 386 404* 444*   < > =  values in this interval not displayed.   Lab Results  Component Value Date   TSH 2.35 06/21/2021   No results found for: "HGBA1C" Lab Results  Component Value Date   CHOL 157 09/07/2020   HDL 59.30 09/07/2020   LDLCALC 74 09/07/2020   LDLDIRECT 162.1 10/28/2009   TRIG 121.0 09/07/2020   CHOLHDL 3 09/07/2020    Significant Diagnostic Results in last 30 days:  VAS [place] LOWER EXTREMITY VENOUS (DVT)  Result Date: 02/07/2022  Lower Venous DVT Study Patient Name:  [name]  Date of Exam:   02/07/2022 Medical Rec #: [id number]       Accession #:    [id number] Date of Birth: [birth year]        Patient Gender: F Patient Age:   77 years Exam Location[facility]  [facility] Procedure:      VAS [place] LOWER EXTREMITY VENOUS (DVT) Referring Phys: Lyn Hollingshead MELVIN --------------------------------------------------------------------------------  Indications: Edema.  Limitations: Pain tolerance. Comparison Study: 01/28/22 prior Performing Technologist: [place] Ponder RVS  Examination Guidelines: A complete evaluation includes B-mode imaging, spectral Doppler, color Doppler, and power Doppler as needed of all accessible portions of each vessel. Bilateral testing is considered an integral part of a complete examination. Limited examinations for reoccurring indications may be performed as noted. The reflux portion of the exam is performed with the patient in reverse Trendelenburg.  +---------+---------------+---------+-----------+----------+-------------------+ RIGHT    CompressibilityPhasicitySpontaneityPropertiesThrombus Aging      +---------+---------------+---------+-----------+----------+-------------------+ CFV      Full           Yes      Yes                                      +---------+---------------+---------+-----------+----------+-------------------+ SFJ      Full                                                              +---------+---------------+---------+-----------+----------+-------------------+ FV Prox  Full                                                             +---------+---------------+---------+-----------+----------+-------------------+ FV Mid                  Yes      Yes                  unable to tolerate                                                        compression due to  pain tolerance-                                                           patent by color                                                           doppler             +---------+---------------+---------+-----------+----------+-------------------+ FV Distal               Yes      Yes                  unable to tolerate                                                        compression due to                                                        pain tolerance-                                                           patent by color                                                           doppler             +---------+---------------+---------+-----------+----------+-------------------+ PFV      Full                                                             +---------+---------------+---------+-----------+----------+-------------------+ POP      Full           Yes      Yes                                      +---------+---------------+---------+-----------+----------+-------------------+ PTV      Full                                                             +---------+---------------+---------+-----------+----------+-------------------+  PERO     Full           Yes      Yes                  patent by color                                                           doppler             +---------+---------------+---------+-----------+----------+-------------------+    +---------+---------------+---------+-----------+----------+-------------------+ LEFT     CompressibilityPhasicitySpontaneityPropertiesThrombus Aging      +---------+---------------+---------+-----------+----------+-------------------+ CFV      Full           Yes      Yes                                      +---------+---------------+---------+-----------+----------+-------------------+ SFJ      Full                                                             +---------+---------------+---------+-----------+----------+-------------------+ FV Prox  Full                                                             +---------+---------------+---------+-----------+----------+-------------------+ FV Mid                  Yes      Yes                  unable to tolerate                                                        compression due to                                                        pain tolerance-                                                           patent by color  doppler             +---------+---------------+---------+-----------+----------+-------------------+ FV Distal               Yes      Yes                  unable to tolerate                                                        compression due to                                                        pain tolerance-                                                           patent by color                                                           doppler             +---------+---------------+---------+-----------+----------+-------------------+ PFV      Full                                                             +---------+---------------+---------+-----------+----------+-------------------+ POP      Full           Yes      Yes                                       +---------+---------------+---------+-----------+----------+-------------------+ PTV      Full                                                             +---------+---------------+---------+-----------+----------+-------------------+ PERO     Full                                                             +---------+---------------+---------+-----------+----------+-------------------+     Summary: RIGHT: - There is no evidence of deep vein thrombosis in the lower extremity.  - A cystic structure is found in the popliteal  fossa.  LEFT: - There is no evidence of deep vein thrombosis in the lower extremity.  - No cystic structure found in the popliteal fossa.  *See table(s) above for measurements and observations. Electronically signed by Waverly Ferrari MD on 02/07/2022 at 6:17:09 PM.    Final    ECHOCARDIOGRAM COMPLETE  Result Date: 02/07/2022    ECHOCARDIOGRAM REPORT   Patient Name:   [name] Date of Exam: 02/07/2022 Medical Rec #:  [id number]      Height:       63.0 in Accession #:    [id number]     Weight:       158.7 lb Date of Birth:  1940-09-25       BSA:          1.753 m Patient Age:    81 years       BP:           120/41 mmHg Patient Gender: F              HR:           79 bpm. Exam Location:  Inpatient Procedure: 2D Echo, 3D Echo, Cardiac Doppler and Color Doppler Indications:    Pulmonary Embolus I26.09  History:        Patient has prior history of Echocardiogram examinations, most                 recent 03/10/2019. CAD, Prior CABG, COPD and TIA; Risk                 Factors:Hypertension. History of AAA repair, GERD, GERD, Chronic                 kidney disease. Mesenteric artery stenosis.  Sonographer:    Leta Jungling RDCS Referring Phys: [id number] Cecille Po MELVIN IMPRESSIONS  1. Left ventricular ejection fraction, by estimation, is 65 to 70%. Left ventricular ejection fraction by 3D volume is 68 %. The left ventricle has normal function. The left ventricle has no regional  wall motion abnormalities. There is severe asymmetric  left ventricular hypertrophy. Left ventricular diastolic parameters are consistent with Grade II diastolic dysfunction (pseudonormalization).  2. Right ventricular systolic function is normal. The right ventricular size is normal. There is normal pulmonary artery systolic pressure.  3. The mitral valve is normal in structure. Trivial mitral valve regurgitation. No evidence of mitral stenosis.  4. The aortic valve is tricuspid. There is mild thickening of the aortic valve. Aortic valve regurgitation is not visualized. Comparison(s): Prior images reviewed side by side. MR has improved. FINDINGS  Left Ventricle: Left ventricular ejection fraction, by estimation, is 65 to 70%. Left ventricular ejection fraction by 3D volume is 68 %. The left ventricle has normal function. The left ventricle has no regional wall motion abnormalities. The left ventricular internal cavity size was small. There is severe asymmetric left ventricular hypertrophy. Left ventricular diastolic parameters are consistent with Grade II diastolic dysfunction (pseudonormalization). Right Ventricle: The right ventricular size is normal. No increase in right ventricular wall thickness. Right ventricular systolic function is normal. There is normal pulmonary artery systolic pressure. The tricuspid regurgitant velocity is 2.16 m/s, and  with an assumed right atrial pressure of 3 mmHg, the estimated right ventricular systolic pressure is 21.7 mmHg. Left Atrium: Left atrial size was normal in size. Right Atrium: Right atrial size was normal in size. Pericardium: There is no evidence of pericardial effusion. Presence of epicardial fat layer. Mitral Valve: The mitral valve  is normal in structure. Trivial mitral valve regurgitation. No evidence of mitral valve stenosis. Tricuspid Valve: The tricuspid valve is normal in structure. Tricuspid valve regurgitation is mild . No evidence of tricuspid stenosis.  Aortic Valve: The aortic valve is tricuspid. There is mild thickening of the aortic valve. Aortic valve regurgitation is not visualized. Pulmonic Valve: The pulmonic valve was normal in structure. Pulmonic valve regurgitation is mild. No evidence of pulmonic stenosis. Aorta: The aortic root and ascending aorta are structurally normal, with no evidence of dilitation. IAS/Shunts: No atrial level shunt detected by color flow Doppler.  LEFT VENTRICLE PLAX 2D LVIDd:         3.60 cm         Diastology LVIDs:         2.30 cm         LV e' medial:    4.05 cm/s LV PW:         1.10 cm         LV E/e' medial:  17.2 LV IVS:        1.50 cm         LV e' lateral:   4.87 cm/s LVOT diam:     2.40 cm         LV E/e' lateral: 14.3 LV SV:         55 LV SV Index:   31 LVOT Area:     4.52 cm        3D Volume EF                                LV 3D EF:    Left                                             ventricul                                             ar                                             ejection                                             fraction                                             by 3D                                             volume is  68 %.                                 3D Volume EF:                                3D EF:        68 %                                LV EDV:       96 ml                                LV ESV:       30 ml                                LV SV:        66 ml RIGHT VENTRICLE RV Basal diam:  3.80 cm RV Mid diam:    2.00 cm RV S prime:     12.90 cm/s TAPSE (M-mode): 1.4 cm LEFT ATRIUM             Index        RIGHT ATRIUM           Index LA diam:        4.00 cm 2.28 cm/m   RA Area:     13.60 cm LA Vol (A2C):   55.7 ml 31.78 ml/m  RA Volume:   33.30 ml  19.00 ml/m LA Vol (A4C):   33.5 ml 19.11 ml/m LA Biplane Vol: 43.5 ml 24.82 ml/m  AORTIC VALVE LVOT Vmax:   72.40 cm/s LVOT Vmean:  48.500 cm/s LVOT VTI:    0.122 m  AORTA Ao Root  diam: 3.20 cm Ao Asc diam:  2.80 cm MITRAL VALVE               TRICUSPID VALVE MV Area (PHT): 3.43 cm    TR Peak grad:   18.7 mmHg MV Decel Time: 221 msec    TR Vmax:        216.00 cm/s MV E velocity: 69.80 cm/s MV A velocity: 69.40 cm/s  SHUNTS MV E/A ratio:  1.01        Systemic VTI:  0.12 m                            Systemic Diam: 2.40 cm Riley Lam MD Electronically signed by Riley Lam MD Signature Date/Time: 02/07/2022/1:27:00 PM    Final    CT Angio Chest PE W and/or Wo Contrast  Result Date: 02/06/2022 CLINICAL DATA:  Provided history: Pulmonary embolism (PE) suspected, high prob reduce dose contrast Dural venous and constipation. Recent hip replacement. EXAM: CT ANGIOGRAPHY CHEST WITH CONTRAST TECHNIQUE: Multidetector CT imaging of the chest was performed using the standard protocol during bolus administration of intravenous contrast. Multiplanar CT image reconstructions and MIPs were obtained to evaluate the vascular anatomy. RADIATION DOSE REDUCTION: This exam was performed according to the departmental dose-optimization program which includes automated exposure control, adjustment of the mA and/or kV according to patient size and/or use of iterative reconstruction technique. CONTRAST:  80  cc Omnipaque 350 IV COMPARISON:  Radiograph earlier today. Chest CT 03/17/2021. FINDINGS: Cardiovascular: Isolated subsegmental embolus in the medial right middle lobe pulmonary artery, for example series 9, image 171 and series 10, image 58. No additional pulmonary emboli. Calcified and noncalcified atheromatous plaque throughout the thoracic aorta. No aneurysm or dissection. Post CABG. Upper normal heart size. No pericardial effusion. Mediastinum/Nodes: No enlarged mediastinal or hilar lymph nodes. No visible thyroid nodule. Tiny hiatal hernia. Lungs/Pleura: Small right and trace left pleural effusion. Associated atelectasis. The previous subpleural right lower lobe nodule is obscured on the  current exam due to pleural fluid and atelectasis. Postsurgical change in the left upper lobe. There is mild bronchial thickening in the lower lobes. Upper Abdomen: No acute upper abdominal findings. Elongated left lobe of the liver. Musculoskeletal: Stable punctate sclerotic focus within T4 vertebral body likely a bone island. Median sternotomy. No acute osseous findings. Review of the MIP images confirms the above findings. IMPRESSION: 1. Isolated subsegmental embolus in the medial right middle lobe pulmonary artery. 2. Small right and trace left pleural effusion. Associated atelectasis. 3. Mild bronchial thickening in the lower lobes. Aortic Atherosclerosis (ICD10-I70.0). Critical Value/emergent results were called by telephone at the time of interpretation on 02/06/2022 at 10:25 pm to provider DAVID YAO , who verbally acknowledged these results. Electronically Signed   By: Narda Rutherford M.D.   On: 02/06/2022 22:26   CT Head Wo Contrast  Result Date: 02/06/2022 CLINICAL DATA:  Mental status change, unknown cause EXAM: CT HEAD WITHOUT CONTRAST TECHNIQUE: Contiguous axial images were obtained from the base of the skull through the vertex without intravenous contrast. RADIATION DOSE REDUCTION: This exam was performed according to the departmental dose-optimization program which includes automated exposure control, adjustment of the mA and/or kV according to patient size and/or use of iterative reconstruction technique. COMPARISON:  02/15/2020 FINDINGS: Brain: No evidence of acute infarction, hemorrhage, mass, mass effect, or midline shift. No hydrocephalus or extra-axial fluid collection. Mildly advanced cerebral atrophy for age. Vascular: No hyperdense vessel. Skull: Normal. Negative for fracture or focal lesion. Sinuses/Orbits: No acute finding. Status post bilateral lens replacements. Other: The mastoid air cells are well aerated. IMPRESSION: No acute intracranial process. Electronically Signed   By: Wiliam Ke M.D.   On: 02/06/2022 14:50   DG Chest 2 View  Result Date: 02/06/2022 CLINICAL DATA:  Cough EXAM: CHEST - 2 VIEW COMPARISON:  Chest x-ray dated Sep 26, 2019 FINDINGS: Cardiac and mediastinal contours are within normal limits. Prior median sternotomy and CABG. Small bilateral pleural effusions and bibasilar atelectasis. No evidence of pneumothorax. IMPRESSION: Small bilateral pleural effusions and bibasilar atelectasis. Electronically Signed   By: Allegra Lai M.D.   On: 02/06/2022 14:49   VAS [place] LOWER EXTREMITY VENOUS (DVT)  Result Date: 01/28/2022  Lower Venous DVT Study Patient Name:  [name]  Date of Exam:   01/28/2022 Medical Rec #: [id number]       Accession #:    [id number] Date of Birth: 1940-05-07        Patient Gender: F Patient Age:   35 years Exam Location:  [facility] Procedure:      VAS [place] LOWER EXTREMITY VENOUS (DVT) Referring Phys: Harriette Bouillon --------------------------------------------------------------------------------  Indications: Edema, and Swelling.  Comparison Study: no prior Performing Technologist: [place] Ponder RVS  Examination Guidelines: A complete evaluation includes B-mode imaging, spectral Doppler, color Doppler, and power Doppler as needed of all accessible portions of each vessel. Bilateral testing is considered an  integral part of a complete examination. Limited examinations for reoccurring indications may be performed as noted. The reflux portion of the exam is performed with the patient in reverse Trendelenburg.  +--------+---------------+---------+-----------+----------+--------------------+ RIGHT   CompressibilityPhasicitySpontaneityPropertiesThrombus Aging       +--------+---------------+---------+-----------+----------+--------------------+ CFV     Full           Yes      Yes                                       +--------+---------------+---------+-----------+----------+--------------------+ SFJ     Full                                                               +--------+---------------+---------+-----------+----------+--------------------+ FV Prox Full                                                              +--------+---------------+---------+-----------+----------+--------------------+ FV Mid  Full                                                              +--------+---------------+---------+-----------+----------+--------------------+ FV      Full                                                              Distal                                                                    +--------+---------------+---------+-----------+----------+--------------------+ PFV     Full                                                              +--------+---------------+---------+-----------+----------+--------------------+ POP     Full           Yes      Yes                                       +--------+---------------+---------+-----------+----------+--------------------+ PTV     Full                                                              +--------+---------------+---------+-----------+----------+--------------------+  PERO    None                                         in a single paired                                                        vein                 +--------+---------------+---------+-----------+----------+--------------------+   +---------+---------------+---------+-----------+----------+--------------+ LEFT     CompressibilityPhasicitySpontaneityPropertiesThrombus Aging +---------+---------------+---------+-----------+----------+--------------+ CFV      Full           Yes      Yes                                 +---------+---------------+---------+-----------+----------+--------------+ SFJ      Full                                                         +---------+---------------+---------+-----------+----------+--------------+ FV Prox  Full                                                        +---------+---------------+---------+-----------+----------+--------------+ FV Mid   Full                                                        +---------+---------------+---------+-----------+----------+--------------+ FV DistalFull                                                        +---------+---------------+---------+-----------+----------+--------------+ PFV      Full                                                        +---------+---------------+---------+-----------+----------+--------------+ POP      Full           Yes      Yes                                 +---------+---------------+---------+-----------+----------+--------------+ PTV      Full                                                        +---------+---------------+---------+-----------+----------+--------------+  PERO     Full                                                        +---------+---------------+---------+-----------+----------+--------------+     Summary: RIGHT: - 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 - A cystic structure is found in the popliteal fossa.  LEFT: - There is no evidence of deep vein thrombosis in the lower extremity.  - No cystic structure found in the popliteal fossa.  *See table(s) above for measurements and observations. Electronically signed by Waverly Ferrari MD on 01/28/2022 at 4:00:42 PM.    Final    DG Pelvis Portable  Result Date: 01/23/2022 CLINICAL DATA:  Postop left hip arthroplasty. EXAM: PORTABLE PELVIS 1-2 VIEWS COMPARISON:  Left hip radiographs 10/06/2021 FINDINGS: Sequelae of interval left total hip arthroplasty are identified. Gas is noted in the adjacent soft tissues. No acute fracture or dislocation is evident on this single image.  There is mild superior hip joint space narrowing on the right. IMPRESSION: Left total hip arthroplasty without evidence of acute osseous abnormality. Electronically Signed   By: Sebastian Ache M.D.   On: 01/23/2022 10:43   DG HIP UNILAT WITH PELVIS 1V LEFT  Result Date: 01/23/2022 CLINICAL DATA:  Fluoroscopy provided during hip replacement. Fluoroscopy time: 24 seconds. Cumulative air kerma: 2.49 mGy EXAM: DG HIP (WITH OR WITHOUT PELVIS) 1V*L* COMPARISON:  None Available. FINDINGS: Four images were obtained during left hip replacement. By the end of the study, the left hip is been replaced in hardware is in good position on frontal imaging. IMPRESSION: Fluoroscopy provided during left hip replacement as above. Electronically Signed   By: Gerome Sam III M.D.   On: 01/23/2022 09:07   DG C-Arm 1-60 Min-No Report  Result Date: 01/23/2022 Fluoroscopy was utilized by the requesting physician.  No radiographic interpretation.   DG C-Arm 1-60 Min-No Report  Result Date: 01/23/2022 Fluoroscopy was utilized by the requesting physician.  No radiographic interpretation.    Assessment/Plan ***   Family/ staff Communication: Discussed plan of care with resident and charge nurse  Labs/tests ordered:     Kenard Gower, DNP, MSN, FNP-BC [facility] [phone] (Monday-Friday 8:00 a.m. - 5:00 p.m.) [phone] (after hours)

## 2022-02-16 ENCOUNTER — Encounter: Payer: Self-pay | Admitting: Nurse Practitioner

## 2022-02-16 ENCOUNTER — Non-Acute Institutional Stay (SKILLED_NURSING_FACILITY): Payer: Medicare PPO | Admitting: Nurse Practitioner

## 2022-02-16 ENCOUNTER — Encounter (HOSPITAL_COMMUNITY): Payer: Self-pay | Admitting: Emergency Medicine

## 2022-02-16 ENCOUNTER — Encounter: Payer: Self-pay | Admitting: Adult Health

## 2022-02-16 ENCOUNTER — Inpatient Hospital Stay (HOSPITAL_COMMUNITY)
Admission: EM | Admit: 2022-02-16 | Discharge: 2022-02-22 | DRG: 377 | Disposition: A | Discharge status: Home Health | Payer: Medicare PPO | Source: Skilled Nursing Facility | Attending: Family Medicine | Admitting: Family Medicine

## 2022-02-16 ENCOUNTER — Emergency Department (HOSPITAL_COMMUNITY): Payer: Medicare PPO

## 2022-02-16 DIAGNOSIS — K429 Umbilical hernia without obstruction or gangrene: Secondary | ICD-10-CM | POA: Diagnosis present

## 2022-02-16 DIAGNOSIS — K219 Gastro-esophageal reflux disease without esophagitis: Secondary | ICD-10-CM

## 2022-02-16 DIAGNOSIS — K625 Hemorrhage of anus and rectum: Secondary | ICD-10-CM

## 2022-02-16 DIAGNOSIS — M1612 Unilateral primary osteoarthritis, left hip: Secondary | ICD-10-CM | POA: Diagnosis not present

## 2022-02-16 DIAGNOSIS — Z96642 Presence of left artificial hip joint: Secondary | ICD-10-CM | POA: Diagnosis not present

## 2022-02-16 DIAGNOSIS — N184 Chronic kidney disease, stage 4 (severe): Secondary | ICD-10-CM | POA: Diagnosis not present

## 2022-02-16 DIAGNOSIS — K5731 Diverticulosis of large intestine without perforation or abscess with bleeding: Principal | ICD-10-CM | POA: Diagnosis present

## 2022-02-16 DIAGNOSIS — K648 Other hemorrhoids: Secondary | ICD-10-CM | POA: Diagnosis present

## 2022-02-16 DIAGNOSIS — E039 Hypothyroidism, unspecified: Secondary | ICD-10-CM

## 2022-02-16 DIAGNOSIS — E8809 Other disorders of plasma-protein metabolism, not elsewhere classified: Secondary | ICD-10-CM | POA: Diagnosis present

## 2022-02-16 DIAGNOSIS — N760 Acute vaginitis: Secondary | ICD-10-CM | POA: Diagnosis present

## 2022-02-16 DIAGNOSIS — I10 Essential (primary) hypertension: Secondary | ICD-10-CM

## 2022-02-16 DIAGNOSIS — Z7901 Long term (current) use of anticoagulants: Secondary | ICD-10-CM | POA: Diagnosis not present

## 2022-02-16 DIAGNOSIS — J90 Pleural effusion, not elsewhere classified: Secondary | ICD-10-CM | POA: Diagnosis not present

## 2022-02-16 DIAGNOSIS — Z86010 Personal history of colonic polyps: Secondary | ICD-10-CM

## 2022-02-16 DIAGNOSIS — Z79899 Other long term (current) drug therapy: Secondary | ICD-10-CM

## 2022-02-16 DIAGNOSIS — R809 Proteinuria, unspecified: Secondary | ICD-10-CM | POA: Diagnosis present

## 2022-02-16 DIAGNOSIS — M25552 Pain in left hip: Secondary | ICD-10-CM

## 2022-02-16 DIAGNOSIS — E785 Hyperlipidemia, unspecified: Secondary | ICD-10-CM | POA: Diagnosis present

## 2022-02-16 DIAGNOSIS — D6832 Hemorrhagic disorder due to extrinsic circulating anticoagulants: Secondary | ICD-10-CM | POA: Diagnosis present

## 2022-02-16 DIAGNOSIS — D649 Anemia, unspecified: Secondary | ICD-10-CM

## 2022-02-16 DIAGNOSIS — M353 Polymyalgia rheumatica: Secondary | ICD-10-CM | POA: Diagnosis present

## 2022-02-16 DIAGNOSIS — G459 Transient cerebral ischemic attack, unspecified: Secondary | ICD-10-CM

## 2022-02-16 DIAGNOSIS — Z9081 Acquired absence of spleen: Secondary | ICD-10-CM

## 2022-02-16 DIAGNOSIS — I2699 Other pulmonary embolism without acute cor pulmonale: Secondary | ICD-10-CM | POA: Diagnosis present

## 2022-02-16 DIAGNOSIS — Z87891 Personal history of nicotine dependence: Secondary | ICD-10-CM

## 2022-02-16 DIAGNOSIS — E559 Vitamin D deficiency, unspecified: Secondary | ICD-10-CM

## 2022-02-16 DIAGNOSIS — K5901 Slow transit constipation: Secondary | ICD-10-CM

## 2022-02-16 DIAGNOSIS — I129 Hypertensive chronic kidney disease with stage 1 through stage 4 chronic kidney disease, or unspecified chronic kidney disease: Secondary | ICD-10-CM | POA: Diagnosis present

## 2022-02-16 DIAGNOSIS — R350 Frequency of micturition: Secondary | ICD-10-CM | POA: Diagnosis not present

## 2022-02-16 DIAGNOSIS — Z8673 Personal history of transient ischemic attack (TIA), and cerebral infarction without residual deficits: Secondary | ICD-10-CM

## 2022-02-16 DIAGNOSIS — J449 Chronic obstructive pulmonary disease, unspecified: Secondary | ICD-10-CM

## 2022-02-16 DIAGNOSIS — M069 Rheumatoid arthritis, unspecified: Secondary | ICD-10-CM

## 2022-02-16 DIAGNOSIS — D631 Anemia in chronic kidney disease: Secondary | ICD-10-CM | POA: Diagnosis present

## 2022-02-16 DIAGNOSIS — D62 Acute posthemorrhagic anemia: Secondary | ICD-10-CM

## 2022-02-16 DIAGNOSIS — K551 Chronic vascular disorders of intestine: Secondary | ICD-10-CM | POA: Diagnosis not present

## 2022-02-16 DIAGNOSIS — T45515A Adverse effect of anticoagulants, initial encounter: Secondary | ICD-10-CM | POA: Diagnosis present

## 2022-02-16 DIAGNOSIS — I824Z1 Acute embolism and thrombosis of unspecified deep veins of right distal lower extremity: Secondary | ICD-10-CM | POA: Diagnosis not present

## 2022-02-16 DIAGNOSIS — Z85038 Personal history of other malignant neoplasm of large intestine: Secondary | ICD-10-CM

## 2022-02-16 DIAGNOSIS — Z9049 Acquired absence of other specified parts of digestive tract: Secondary | ICD-10-CM

## 2022-02-16 DIAGNOSIS — Z833 Family history of diabetes mellitus: Secondary | ICD-10-CM

## 2022-02-16 DIAGNOSIS — Z96641 Presence of right artificial hip joint: Secondary | ICD-10-CM

## 2022-02-16 DIAGNOSIS — R58 Hemorrhage, not elsewhere classified: Secondary | ICD-10-CM | POA: Diagnosis not present

## 2022-02-16 DIAGNOSIS — Z951 Presence of aortocoronary bypass graft: Secondary | ICD-10-CM

## 2022-02-16 DIAGNOSIS — I2510 Atherosclerotic heart disease of native coronary artery without angina pectoris: Secondary | ICD-10-CM | POA: Diagnosis present

## 2022-02-16 DIAGNOSIS — I252 Old myocardial infarction: Secondary | ICD-10-CM

## 2022-02-16 DIAGNOSIS — Z83438 Family history of other disorder of lipoprotein metabolism and other lipidemia: Secondary | ICD-10-CM

## 2022-02-16 DIAGNOSIS — K922 Gastrointestinal hemorrhage, unspecified: Secondary | ICD-10-CM

## 2022-02-16 DIAGNOSIS — Z8249 Family history of ischemic heart disease and other diseases of the circulatory system: Secondary | ICD-10-CM

## 2022-02-16 DIAGNOSIS — Z8679 Personal history of other diseases of the circulatory system: Secondary | ICD-10-CM

## 2022-02-16 DIAGNOSIS — Z823 Family history of stroke: Secondary | ICD-10-CM

## 2022-02-16 DIAGNOSIS — M6281 Muscle weakness (generalized): Secondary | ICD-10-CM | POA: Diagnosis not present

## 2022-02-16 DIAGNOSIS — R079 Chest pain, unspecified: Secondary | ICD-10-CM | POA: Diagnosis not present

## 2022-02-16 DIAGNOSIS — I700 Atherosclerosis of aorta: Secondary | ICD-10-CM

## 2022-02-16 DIAGNOSIS — R0602 Shortness of breath: Secondary | ICD-10-CM | POA: Diagnosis not present

## 2022-02-16 DIAGNOSIS — Z79890 Hormone replacement therapy (postmenopausal): Secondary | ICD-10-CM

## 2022-02-16 DIAGNOSIS — I639 Cerebral infarction, unspecified: Secondary | ICD-10-CM | POA: Diagnosis not present

## 2022-02-16 LAB — RETICULOCYTES
Immature Retic Fract: 27.3 % — ABNORMAL HIGH (ref 2.3–15.9)
RBC.: 2.49 MIL/uL — ABNORMAL LOW (ref 3.87–5.11)
Retic Count, Absolute: 82.2 10*3/uL (ref 19.0–186.0)
Retic Ct Pct: 3.3 % — ABNORMAL HIGH (ref 0.4–3.1)

## 2022-02-16 LAB — CBC WITH DIFFERENTIAL/PLATELET
Abs Immature Granulocytes: 0.13 10*3/uL — ABNORMAL HIGH (ref 0.00–0.07)
Basophils Absolute: 0.2 10*3/uL — ABNORMAL HIGH (ref 0.0–0.1)
Basophils Relative: 3 %
Eosinophils Absolute: 0.4 10*3/uL (ref 0.0–0.5)
Eosinophils Relative: 5 %
HCT: 18.1 % — ABNORMAL LOW (ref 36.0–46.0)
Hemoglobin: 6.1 g/dL — CL (ref 12.0–15.0)
Immature Granulocytes: 2 %
Lymphocytes Relative: 27 %
Lymphs Abs: 2 10*3/uL (ref 0.7–4.0)
MCH: 32.8 pg (ref 26.0–34.0)
MCHC: 33.7 g/dL (ref 30.0–36.0)
MCV: 97.3 fL (ref 80.0–100.0)
Monocytes Absolute: 1.1 10*3/uL — ABNORMAL HIGH (ref 0.1–1.0)
Monocytes Relative: 15 %
Neutro Abs: 3.6 10*3/uL (ref 1.7–7.7)
Neutrophils Relative %: 48 %
Platelets: 460 10*3/uL — ABNORMAL HIGH (ref 150–400)
RBC: 1.86 MIL/uL — ABNORMAL LOW (ref 3.87–5.11)
RDW: 16.9 % — ABNORMAL HIGH (ref 11.5–15.5)
WBC: 7.6 10*3/uL (ref 4.0–10.5)
nRBC: 1.9 % — ABNORMAL HIGH (ref 0.0–0.2)

## 2022-02-16 LAB — COMPREHENSIVE METABOLIC PANEL
ALT: 19 U/L (ref 0–44)
AST: 25 U/L (ref 15–41)
Albumin: 2.3 g/dL — ABNORMAL LOW (ref 3.5–5.0)
Alkaline Phosphatase: 74 U/L (ref 38–126)
Anion gap: 11 (ref 5–15)
BUN: 43 mg/dL — ABNORMAL HIGH (ref 8–23)
CO2: 22 mmol/L (ref 22–32)
Calcium: 8.8 mg/dL — ABNORMAL LOW (ref 8.9–10.3)
Chloride: 104 mmol/L (ref 98–111)
Creatinine, Ser: 2.22 mg/dL — ABNORMAL HIGH (ref 0.44–1.00)
GFR, Estimated: 21 mL/min — ABNORMAL LOW (ref >60)
Glucose, Bld: 129 mg/dL — ABNORMAL HIGH (ref 70–99)
Potassium: 4.8 mmol/L (ref 3.5–5.1)
Sodium: 137 mmol/L (ref 135–145)
Total Bilirubin: 0.3 mg/dL (ref 0.3–1.2)
Total Protein: 4.8 g/dL — ABNORMAL LOW (ref 6.5–8.1)

## 2022-02-16 LAB — HEMOGLOBIN AND HEMATOCRIT, BLOOD
HCT: 22.8 % — ABNORMAL LOW (ref 36.0–46.0)
HCT: 22.6 % — ABNORMAL LOW (ref 36.0–46.0)
Hemoglobin: 8 g/dL — ABNORMAL LOW (ref 12.0–15.0)
Hemoglobin: 7.7 g/dL — ABNORMAL LOW (ref 12.0–15.0)

## 2022-02-16 LAB — TSH: TSH: 0.985 u[IU]/mL (ref 0.350–4.500)

## 2022-02-16 LAB — FERRITIN: Ferritin: 128 ng/mL (ref 11–307)

## 2022-02-16 LAB — IRON AND TIBC
Iron: 104 ug/dL (ref 28–170)
Saturation Ratios: 39 % — ABNORMAL HIGH (ref 10.4–31.8)
TIBC: 266 ug/dL (ref 250–450)
UIBC: 162 ug/dL

## 2022-02-16 LAB — POC OCCULT BLOOD, ED: Fecal Occult Bld: POSITIVE — AB

## 2022-02-16 LAB — PREPARE RBC (CROSSMATCH)

## 2022-02-16 LAB — PROTIME-INR
INR: 1.3 — ABNORMAL HIGH (ref 0.8–1.2)
Prothrombin Time: 16.3 seconds — ABNORMAL HIGH (ref 11.4–15.2)

## 2022-02-16 LAB — BRAIN NATRIURETIC PEPTIDE: B Natriuretic Peptide: 227.8 pg/mL — ABNORMAL HIGH (ref 0.0–100.0)

## 2022-02-16 MED ORDER — ATORVASTATIN CALCIUM 80 MG PO TABS
80.0000 mg | ORAL_TABLET | Freq: Every day | ORAL | Status: DC
  Administered 2022-02-16 – 2022-02-22 (×7): 80 mg via ORAL
  Filled 2022-02-16 (×2): qty 1
  Filled 2022-02-16: qty 2
  Filled 2022-02-16 (×4): qty 1

## 2022-02-16 MED ORDER — TAMSULOSIN HCL 0.4 MG PO CAPS
0.4000 mg | ORAL_CAPSULE | Freq: Every day | ORAL | Status: DC
  Administered 2022-02-16 – 2022-02-22 (×7): 0.4 mg via ORAL
  Filled 2022-02-16 (×7): qty 1

## 2022-02-16 MED ORDER — ALBUTEROL SULFATE HFA 108 (90 BASE) MCG/ACT IN AERS: 1.0000 | INHALATION_SPRAY | RESPIRATORY_TRACT | Status: DC | PRN

## 2022-02-16 MED ORDER — SODIUM CHLORIDE 0.9 % IV BOLUS
500.0000 mL | Freq: Once | INTRAVENOUS | Status: AC
  Administered 2022-02-16: 500 mL via INTRAVENOUS

## 2022-02-16 MED ORDER — SODIUM CHLORIDE 0.9 % IV SOLN
10.0000 mL/h | Freq: Once | INTRAVENOUS | Status: AC
  Administered 2022-02-16: 10 mL/h via INTRAVENOUS

## 2022-02-16 MED ORDER — HYDRALAZINE HCL 25 MG PO TABS
25.0000 mg | ORAL_TABLET | Freq: Four times a day (QID) | ORAL | Status: DC | PRN
  Administered 2022-02-16: 25 mg via ORAL
  Filled 2022-02-16: qty 1

## 2022-02-16 MED ORDER — LEVOTHYROXINE SODIUM 88 MCG PO TABS
88.0000 ug | ORAL_TABLET | Freq: Every day | ORAL | Status: DC
  Administered 2022-02-17 – 2022-02-22 (×6): 88 ug via ORAL
  Filled 2022-02-16 (×6): qty 1

## 2022-02-16 MED ORDER — PANTOPRAZOLE SODIUM 40 MG PO TBEC
40.0000 mg | DELAYED_RELEASE_TABLET | Freq: Every day | ORAL | Status: DC
  Administered 2022-02-16 – 2022-02-22 (×7): 40 mg via ORAL
  Filled 2022-02-16 (×7): qty 1

## 2022-02-16 MED ORDER — HYDROCORTISONE (PERIANAL) 2.5 % EX CREA
TOPICAL_CREAM | Freq: Two times a day (BID) | CUTANEOUS | Status: DC
  Administered 2022-02-17: 1 via RECTAL
  Filled 2022-02-16: qty 28.35

## 2022-02-16 MED ORDER — UMECLIDINIUM BROMIDE 62.5 MCG/ACT IN AEPB
1.0000 | INHALATION_SPRAY | Freq: Every day | RESPIRATORY_TRACT | Status: DC
  Administered 2022-02-17 – 2022-02-22 (×6): 1 via RESPIRATORY_TRACT
  Filled 2022-02-16: qty 7

## 2022-02-16 MED ORDER — UMECLIDINIUM BROMIDE 62.5 MCG/ACT IN AEPB
1.0000 | INHALATION_SPRAY | Freq: Every day | RESPIRATORY_TRACT | Status: DC
  Filled 2022-02-16: qty 7

## 2022-02-16 MED ORDER — CLOTRIMAZOLE 1 % VA CREA
1.0000 | TOPICAL_CREAM | Freq: Every day | VAGINAL | Status: DC
  Administered 2022-02-17 – 2022-02-21 (×6): 1 via VAGINAL
  Filled 2022-02-16: qty 45

## 2022-02-16 MED ORDER — FAMOTIDINE 20 MG PO TABS: 20.0000 mg | ORAL_TABLET | Freq: Two times a day (BID) | ORAL | Status: DC

## 2022-02-16 MED ORDER — FAMOTIDINE 20 MG PO TABS
20.0000 mg | ORAL_TABLET | Freq: Every day | ORAL | Status: DC
  Administered 2022-02-17 – 2022-02-22 (×6): 20 mg via ORAL
  Filled 2022-02-16 (×6): qty 1

## 2022-02-16 MED ORDER — ARFORMOTEROL TARTRATE 15 MCG/2ML IN NEBU: 15.0000 ug | INHALATION_SOLUTION | Freq: Two times a day (BID) | RESPIRATORY_TRACT | Status: DC

## 2022-02-16 MED ORDER — GABAPENTIN 300 MG PO CAPS
300.0000 mg | ORAL_CAPSULE | Freq: Every day | ORAL | Status: DC
  Administered 2022-02-16 – 2022-02-21 (×6): 300 mg via ORAL
  Filled 2022-02-16 (×6): qty 1

## 2022-02-16 MED ORDER — ACETAMINOPHEN 325 MG PO TABS: 650.0000 mg | ORAL_TABLET | Freq: Four times a day (QID) | ORAL | Status: DC | PRN

## 2022-02-16 MED ORDER — SENNOSIDES-DOCUSATE SODIUM 8.6-50 MG PO TABS
2.0000 | ORAL_TABLET | Freq: Two times a day (BID) | ORAL | Status: DC
  Administered 2022-02-16 – 2022-02-22 (×7): 2 via ORAL
  Filled 2022-02-16 (×7): qty 2

## 2022-02-16 MED ORDER — ARFORMOTEROL TARTRATE 15 MCG/2ML IN NEBU
15.0000 ug | INHALATION_SOLUTION | Freq: Two times a day (BID) | RESPIRATORY_TRACT | Status: DC
  Administered 2022-02-16 – 2022-02-22 (×12): 15 ug via RESPIRATORY_TRACT
  Filled 2022-02-16 (×12): qty 2

## 2022-02-16 MED ORDER — TRIAMCINOLONE ACETONIDE 55 MCG/ACT NA AERO
1.0000 | INHALATION_SPRAY | Freq: Every day | NASAL | Status: DC
  Administered 2022-02-17 – 2022-02-22 (×6): 1 via NASAL
  Filled 2022-02-16: qty 10.8

## 2022-02-16 MED ORDER — VITAMIN B-12 1000 MCG PO TABS
1000.0000 ug | ORAL_TABLET | Freq: Every day | ORAL | Status: DC
  Administered 2022-02-16 – 2022-02-22 (×7): 1000 ug via ORAL
  Filled 2022-02-16 (×7): qty 1

## 2022-02-16 MED ORDER — ALBUTEROL SULFATE (2.5 MG/3ML) 0.083% IN NEBU: 2.5000 mg | INHALATION_SOLUTION | Freq: Four times a day (QID) | RESPIRATORY_TRACT | Status: DC | PRN

## 2022-02-16 NOTE — Assessment & Plan Note (Signed)
takes Vit D 

## 2022-02-16 NOTE — Assessment & Plan Note (Signed)
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 

## 2022-02-16 NOTE — Consult Note (Addendum)
Referring Provider: Dr. Pearline Cables, EDP Primary Care Physician:  Alain Marion [name], MD Primary Gastroenterologist:  Dr. Havery Moros  Reason for Consultation: GI bleed  HPI: [name] is a 81 y.o. 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  PE followed hip surgery that was on 9/25.  Was just discharged from the PE hospital stay on 10/14.  Says that for the past couple of days she has been seeing blood any time when she gets up and has to have a bowel movement it fills the toilet bowl.  No abdominal pain.  She was seen by our service for lower GI bleed suspected to be diverticular in origin during hospitalization in July.  Nuc med bleeding scan at that time was negative and bleeding resolved.  She was on Plavix at that point, but now is off of the Plavix since she was placed on the Eliquis for the PE just recently.  Last dose of Eliquis was 10/18 p.m.  Hemoglobin 6.1 g down from 7.8 g 6 days ago and 9.6 g 10 days ago.  Receiving 2 units packed red blood cells.  03/29/2016 colonoscopy with 5 polyps throughout the colon, diverticulosis and internal hemorrhoids; pathology with tubular adenomas, of note patient had a polyp within the appendix which was biopsied and was precancerous, it was not amenable to endoscopic resection it was recommended she have an appendectomy to remove that polyp as it was at risk for development of appendiceal cancer, she was referred to general surgery (underwent appendectomy 06/28/2016).   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 Most likely scar. Mild subendocardial ischemia. EF 71% LOW RISK.    Cervical disc disease    fracture   Chronic kidney disease    COPD (chronic obstructive pulmonary disease) (HCC)    Diverticulosis    Hyperlipidemia    Hypertension    Hypothyroidism (acquired)    hypo   Myocardial infarction ([place]) 11/2014   TIA   Polymyalgia rheumatica ([place])    2011 Dr. Charlestine Night   Rheumatoid arthritis [facility]) 2011   Dr.Truslow; fracture knees, hands and wrists -    S/P CABG x 1 1996   CABG--LIMA-LAD for ostial LAD (not felt to be PCI amenable). EF NORMAL then; LIMA now atretic   Shortness of breath dyspnea    with exertion   Stroke ([place]) 11/2014   TIA    Urinary frequency     Past Surgical History:  Procedure Laterality Date  ABDOMINAL AORTIC ANEURYSM REPAIR  2409   Complicated by mesenteric artery stenosis and splenic artery infarction with acquired Asplenia   APPENDECTOMY     BUNIONECTOMY  07/2011   right foot   CARDIAC CATHETERIZATION  2005   (Most recent CATH) - ostial LAD lesion 20-30% (down from 90% initially). Atretic LIMA. Minimal disease the RCA and Circumflex system.   CARPAL TUNNEL RELEASE Left    CATARACT EXTRACTION Bilateral    CERVICAL SPINE SURGERY     plate 2008 Dr. Saintclair Halsted   COLONOSCOPY     CORONARY ARTERY BYPASS GRAFT  1996   INCLUDED AN INTERNAL MAMMARY ARTERY TO THE LAD. EF WAS NORMAL   INGUINAL HERNIA REPAIR Right    LAPAROSCOPIC  APPENDECTOMY N/A 06/28/2016   Procedure: APPENDECTOMY LAPAROSCOPIC;  Surgeon: Leighton Ruff, MD;  Location: [facility];  Service: General;  Laterality: N/A;   NM MYOVIEW LTD  06/2010   Fixed anteroseptal, apical and inferoapical defect with moderate size. Most likely scar. Mild subendocardial ischemia. EF 71% LOW RISK.    SPLENECTOMY     TOTAL HIP ARTHROPLASTY Left 01/23/2022   Procedure: LEFT TOTAL HIP ARTHROPLASTY ANTERIOR APPROACH;  Surgeon: Leandrew Koyanagi, MD;  Location: [place];  Service: Orthopedics;  Laterality: Left;  3-C   TRANSTHORACIC ECHOCARDIOGRAM  12/2014   [facility]: Normal LV size & function. EF 55-60%,    vagina polyp     VIDEO ASSISTED THORACOSCOPY (VATS)/WEDGE RESECTION Left 03/02/2015   Procedure: VIDEO ASSISTED THORACOSCOPY (VATS), MINI THORACOTOMY, LEFT UPPER LOBE WEDGE, TAKE DOWN OF INTERNAL MAMMARY LESIONS, PLACEMENT OF ON-Q PUMP;  Surgeon: Grace Isaac, MD;  Location: [place];  Service: Thoracic;  Laterality: Left;   VIDEO BRONCHOSCOPY N/A 03/02/2015   Procedure: BRONCHOSCOPY;  Surgeon: Grace Isaac, MD;  Location: [place];  Service: Thoracic;  Laterality: N/A;   VIDEO BRONCHOSCOPY WITH ENDOBRONCHIAL NAVIGATION N/A 10/08/2017   Procedure: VIDEO BRONCHOSCOPY WITH ENDOBRONCHIAL NAVIGATION WITH BIOPSIES OF LEFT UPPER LOBE AND LEFT LOWER LOBE;  Surgeon: Grace Isaac, MD;  Location: [facility];  Service: Thoracic;  Laterality: N/A;    Prior to Admission medications   Medication Sig Start Date End Date Taking? Authorizing Provider  albuterol (VENTOLIN HFA) 108 (90 Base) MCG/ACT inhaler Inhale 1-2 puffs into the lungs every 4 (four) hours as needed for wheezing or shortness of breath.    [provider]  amLODipine (NORVASC) 2.5 MG tablet Take 1 tablet (2.5 mg total) by mouth daily. 09/29/21   Plotnikov, [name], MD  apixaban (ELIQUIS) 5 MG TABS tablet Take 2 tablets (10 mg total) by mouth 2 (two) times daily for 4 days, THEN 1 tablet (5 mg total) 2 (two)  times daily. 02/11/22 03/17/22  Mariel Aloe, MD  atorvastatin (LIPITOR) 80 MG tablet Take 80 mg by mouth daily.    [provider]  Cholecalciferol (VITAMIN D3) 50 MCG (2000 UT) capsule Take 1 capsule (2,000 Units total) by mouth daily. 11/13/18   Plotnikov, [name], MD  Cyanocobalamin 2500 MCG TABS Take 2,500 mcg by mouth daily.    [provider]  docusate sodium (COLACE) 100 MG capsule Take 100 mg by mouth 2 (two) times daily.    [provider]  famotidine (PEPCID) 20 MG tablet Take 20 mg by mouth 2 (two) times daily.    [provider]  gabapentin (NEURONTIN) 300 MG capsule Take 1 capsule (300 mg total) by mouth at bedtime. 02/11/22   Mariel Aloe, MD  Golimumab  50 MG/0.5ML SOAJ Take 50 mg by mouth every 2 (two) months. Hold for the next 2 weeks per Orthopedic surgery recommendations. 02/11/22   Mariel Aloe, MD  hydrochlorothiazide (HYDRODIURIL) 12.5 MG tablet TAKE 1 TABLET BY MOUTH EVERY DAY Patient taking differently: Take 12.5 mg by mouth daily. 04/08/21   Tanda Rockers, MD  hydrocortisone (ANUSOL-HC) 2.5 % rectal cream Place rectally 2 (two) times daily. 02/11/22   Mariel Aloe, MD  leflunomide (ARAVA) 20 MG tablet Take 20 mg by mouth daily.     [provider]  levothyroxine (SYNTHROID) 88 MCG tablet TAKE 1 TABLET (88 MCG TOTAL) BY MOUTH DAILY BEFORE BREAKFAST. 01/09/22   Plotnikov, [name], MD  metoprolol tartrate (LOPRESSOR) 50 MG tablet Take 50 mg by mouth 2 (two) times daily.    [provider]  Multiple Vitamins-Minerals (CENTRUM ADULT PO) Take 1 tablet by mouth daily. 1 Tablet Daily.    [provider]  pantoprazole (PROTONIX) 40 MG tablet Take 40 mg by mouth daily.    [provider]  tamsulosin (FLOMAX) 0.4 MG CAPS capsule Take 1 capsule (0.4 mg total) by mouth daily. 02/12/22   Mariel Aloe, MD  Tiotropium Bromide-Olodaterol (STIOLTO RESPIMAT) 2.5-2.5 MCG/ACT AERS Inhale 2 each into the  lungs at bedtime.    [provider]  triamcinolone (NASACORT) 55 MCG/ACT AERO nasal inhaler Place 1 spray into the nose daily.    [provider]    Current Facility-Administered Medications  Medication Dose Route Frequency Provider Last Rate Last Admin   0.9 %  sodium chloride infusion  10 mL/hr Intravenous Once Wynona Dove A, DO       sodium chloride 0.9 % bolus 500 mL  500 mL Intravenous Once Jeanell Sparrow, DO       Current Outpatient Medications  Medication Sig Dispense Refill   albuterol (VENTOLIN HFA) 108 (90 Base) MCG/ACT inhaler Inhale 1-2 puffs into the lungs every 4 (four) hours as needed for wheezing or shortness of breath.     amLODipine (NORVASC) 2.5 MG tablet Take 1 tablet (2.5 mg total) by mouth daily. 90 tablet 2   apixaban (ELIQUIS) 5 MG TABS tablet Take 2 tablets (10 mg total) by mouth 2 (two) times daily for 4 days, THEN 1 tablet (5 mg total) 2 (two) times daily.     atorvastatin (LIPITOR) 80 MG tablet Take 80 mg by mouth daily.     Cholecalciferol (VITAMIN D3) 50 MCG (2000 UT) capsule Take 1 capsule (2,000 Units total) by mouth daily. 100 capsule 3   Cyanocobalamin 2500 MCG TABS Take 2,500 mcg by mouth daily.     docusate sodium (COLACE) 100 MG capsule Take 100 mg by mouth 2 (two) times daily.     famotidine (PEPCID) 20 MG tablet Take 20 mg by mouth 2 (two) times daily.     gabapentin (NEURONTIN) 300 MG capsule Take 1 capsule (300 mg total) by mouth at bedtime.     Golimumab 50 MG/0.5ML SOAJ Take 50 mg by mouth every 2 (two) months. Hold for the next 2 weeks per Orthopedic surgery recommendations. 0.3 mL    hydrochlorothiazide (HYDRODIURIL) 12.5 MG tablet TAKE 1 TABLET BY MOUTH EVERY DAY (Patient taking differently: Take 12.5 mg by mouth daily.) 90 tablet 3   hydrocortisone (ANUSOL-HC) 2.5 % rectal cream Place rectally 2 (two) times daily. 30 g 0   leflunomide (ARAVA) 20 MG tablet Take 20 mg by mouth daily.      levothyroxine (SYNTHROID)  88 MCG tablet  TAKE 1 TABLET (88 MCG TOTAL) BY MOUTH DAILY BEFORE BREAKFAST. 90 tablet 3   metoprolol tartrate (LOPRESSOR) 50 MG tablet Take 50 mg by mouth 2 (two) times daily.     Multiple Vitamins-Minerals (CENTRUM ADULT PO) Take 1 tablet by mouth daily. 1 Tablet Daily.     pantoprazole (PROTONIX) 40 MG tablet Take 40 mg by mouth daily.     tamsulosin (FLOMAX) 0.4 MG CAPS capsule Take 1 capsule (0.4 mg total) by mouth daily.     Tiotropium Bromide-Olodaterol (STIOLTO RESPIMAT) 2.5-2.5 MCG/ACT AERS Inhale 2 each into the lungs at bedtime.     triamcinolone (NASACORT) 55 MCG/ACT AERO nasal inhaler Place 1 spray into the nose daily.      Allergies as of 02/16/2022 - Review Complete 02/16/2022  Allergen Reaction Noted   Nsaids Other (See Comments) 01/16/2022   Aspirin Other (See Comments)    Codeine Nausea And Vomiting    Nitroglycerin Other (See Comments) 11/08/2010    Family History  Problem Relation Age of Onset   Hypertension Mother    Diabetes Mother    Heart disease Mother    Hyperlipidemia Mother    Stroke Father    Hyperlipidemia Sister    Hypertension Sister    Hypertension Daughter    Cancer Paternal Uncle        Deceased from cancer not sure of site   Hypertension Son    Hyperlipidemia Son    Hyperlipidemia Son    Hypertension Son    Hyperlipidemia Son    Hypertension Son    Hypertension Other    Coronary artery disease Other    Asthma Neg Hx    Colon cancer Neg Hx     Social History   Socioeconomic History   Marital status: Married    Spouse name: Not on file   Number of children: 4   Years of education: Not on file   Highest education level: Not on file  Occupational History   Occupation: retired    Fish farm manager: RETIRED  Tobacco Use   Smoking status: Former    Types: Cigarettes    Quit date: 12/04/1958    Years since quitting: 63.2    Passive exposure: Never   Smokeless tobacco: Never  Vaping Use   Vaping Use: Never used  Substance and Sexual Activity   Alcohol  use: Yes    Comment: hardly ever   Drug use: No   Sexual activity: Not Currently  Other Topics Concern   Not on file  Social History Narrative   [facility]  [place] ; 4 [place].    Living at [place] since dec 2015   Social Determinants of Health   Financial Resource Strain: Low Risk  (05/30/2021)   Overall Financial Resource Strain (CARDIA)    Difficulty of Paying Living Expenses: Not hard at all  Food Insecurity: No Food Insecurity (01/23/2022)   Hunger Vital Sign    Worried About Running Out of Food in the Last Year: Never true    Ran Out of Food in the Last Year: Never true  Transportation Needs: No Transportation Needs (01/23/2022)   PRAPARE - Hydrologist (Medical): No    Lack of Transportation (Non-Medical): No  Physical Activity: Insufficiently Active (05/30/2021)   Exercise Vital Sign    Days of Exercise per Week: 3 days    Minutes of Exercise per Session: 30 min  Stress: No  Stress Concern Present (05/30/2021)   [place]    Feeling of Stress : Not at all  Social Connections: Moderately Integrated (05/30/2021)   Social Connection and Isolation Panel [NHANES]    Frequency of Communication with Friends and Family: Twice a week    Frequency of Social Gatherings with Friends and Family: Twice a week    Attends Religious Services: Never    Marine scientist or Organizations: Yes    Attends Music therapist: More than 4 times per year    Marital Status: Married  Human resources officer Violence: Not At Risk (01/23/2022)   Humiliation, Afraid, Rape, and Kick questionnaire    Fear of Current or Ex-Partner: No    Emotionally Abused: No    Physically Abused: No    Sexually Abused: No    Review of Systems: ROS is O/W negative except as mentioned in HPI.  Physical Exam: Vital signs in last 24 hours: Temp:  [98 F (36.7  C)-98.3 F (36.8 C)] 98 F (36.7 C) (10/19 1151) Pulse Rate:  [103-104] 103 (10/19 1151) Resp:  [18-20] 18 (10/19 1151) BP: (155-160)/(57-61) 160/61 (10/19 1151) SpO2:  [96 %-99 %] 99 % (10/19 1155)   General:  Alert, Well-developed, well-nourished, pleasant and cooperative in NAD; appears pale Head:  Normocephalic and atraumatic. Eyes:  Sclera clear, no icterus.  Conjunctiva pale. Ears:  Normal auditory acuity. Mouth:  No deformity or lesions.   Lungs:  Clear throughout to auscultation.   No wheezes, crackles, or rhonchi.  Heart:  Slightly tachy; no murmurs, clicks, rubs, or gallops. Abdomen:  Soft,nontender, BS active,nonpalp mass or hsm.   Rectal:  Pilar Plate blood per EDP.  Msk:  Symmetrical without gross deformities. Pulses:  Normal pulses noted. Extremities:  Without clubbing or edema. Neurologic:  Alert and oriented x 4;  grossly normal neurologically. Skin:  Intact without significant lesions or rashes. Psych:  Alert and cooperative. Normal mood and affect.  Lab Results: Recent Labs    02/16/22 1205  WBC 7.6  HGB 6.1*  HCT 18.1*  PLT 460*   BMET Recent Labs    02/16/22 1205  NA 137  K 4.8  CL 104  CO2 22  GLUCOSE 129*  BUN 43*  CREATININE 2.22*  CALCIUM 8.8*   LFT Recent Labs    02/16/22 1205  PROT 4.8*  ALBUMIN 2.3*  AST 25  ALT 19  ALKPHOS 74  BILITOT 0.3   PT/INR Recent Labs    02/16/22 1205  LABPROT 16.3*  INR 1.3*   IMPRESSION:  *LGIB: Suspect diverticular bleed.  Had similar presentation in July 2023.  Nuc med bleeding scan at that time was negative and bleeding resolved.  Now this is in the setting of Eliquis with recent PE. *Recent PE on Eliquis: PE diagnosed on 02/06/2022 after having hip surgery 9/25.  Last dose 10/18 PM. *Acute blood loss anemia: Hemoglobin 6.1 g down from 7.8 g 6 days ago and 9.6 g 10 days ago.  Receiving 2 units packed red blood cells.  PLAN: -She just had a CTA of the chest on 10/9 to diagnosed her PE, but her  renal function is poor.  Nuc med bleeding scan was ordered in July for her GI bleeding because of this.  If bleeding continues then need to repeat nuc med bleeding scan. -Monitor hemoglobin and transfuse further if needed. -Ok for clear liquids. -Observe for now, but seems like the bleeding has already been going  on for a few days without improvement. -Hold Eliquis for now.   Laban Emperor. Zehr  02/16/2022, 1:10 PM   Attending Physician Note   I have taken a history, reviewed the chart and examined the patient. I performed a substantive portion of this encounter, including complete performance of at least one of the key components, in conjunction with the APP. I agree with the APP's note, impression and recommendations with my edits. My additional impressions and recommendations are as follows.   *Acute painless LGI bleed, suspect diverticular  *PE on Eliquis, diagnosed earlier this month  *ABL anemia   *Supportive care with transfusions, IVF, bedrest *Hold Eliquis *If significant bleeding recurs CTA abd/pelvis vs nuc med bleeding scan, favor the latter given CKD4     Lucio Edward, MD [facility] See [facility], [facility], for our on call provider

## 2022-02-16 NOTE — Progress Notes (Signed)
Location:  [facility] Room Number: NO/65/A Place of Service:  SNF (31) Provider:  Durenda Age, DNP, FNP-BC  Patient Care Team: Sherry Anger, MD as PCP - General (Internal Medicine) Sherry Man, MD as PCP - Cardiology (Cardiology) Sherry Man, MD as Consulting Physician (Cardiology) Sherry Pound, MD as Consulting Physician (Rheumatology) Armbruster, Carlota Raspberry, MD as Consulting Physician (Gastroenterology) Tanda Rockers, MD as Consulting Physician (Pulmonary Disease) Grace Isaac, MD (Inactive) as Consulting Physician (Cardiothoracic Surgery) Aloha Gell, MD as Consulting Physician (Obstetrics and Gynecology) [name], Sherry Lacks, MD  Extended Emergency Contact Information Primary Emergency Contact: [name] Address: [address]           [place], [place] [address] [place] Phone: [phone] Mobile Phone: [phone] Relation: Spouse Secondary Emergency Contact: [name],Sherry Address: [address] [place] [address] [place] Phone: [phone] Relation: Friend  Code Status:  FULL  Goals of care: Advanced Directive information    02/07/2022    8:13 AM  Advanced Directives  Does Patient Have a Medical Advance Directive? No     Chief Complaint  Patient presents with   Acute Visit    Patient is being seen for anemia    HPI:  Pt is a 81 y.o. female seen today for an acute visit regarding low hemoglobin. Latest hgb 8.1, up from 7.8 (taken 02/14/22). She was recently hospitalized 02/06/22 to 02/11/22 for acute pulmonary embolism. She was on Lovenox for DVT prophylaxis S/P left hip replacement on 01/23/22. She presented tot he hospital with shortness of breath and was found to have acute PE. Heparin IV was initiated and was transitioned to Eliquis.   Patient was seen in the room with husband at bedside. She reported having constipation  and causing her hemorrhoids to bleed. She takes Colace 100 mg BID for constipation.   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 Most likely scar. Mild subendocardial ischemia. EF 71% LOW RISK.    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 Charlestine Night   Rheumatoid arthritis [facility]) 2011   Dr.Truslow; fracture knees, hands and wrists -    S/P CABG x 1 1996   CABG--LIMA-LAD for ostial LAD (not felt to be PCI amenable). EF NORMAL then; LIMA now atretic   Shortness of breath dyspnea    with exertion   Stroke ([place]) 11/2014   TIA  Urinary frequency    Past Surgical History:  Procedure Laterality Date   ABDOMINAL AORTIC ANEURYSM REPAIR  3762   Complicated by mesenteric artery stenosis and splenic artery infarction with acquired Asplenia   APPENDECTOMY     BUNIONECTOMY  07/2011   right foot   CARDIAC CATHETERIZATION  2005   (Most recent CATH) - ostial LAD lesion 20-30% (down from 90% initially). Atretic LIMA. Minimal disease the RCA and Circumflex system.   CARPAL TUNNEL RELEASE Left    CATARACT EXTRACTION Bilateral    CERVICAL SPINE  SURGERY     plate 2008 Dr. Saintclair Halsted   COLONOSCOPY     CORONARY ARTERY BYPASS GRAFT  1996   INCLUDED AN INTERNAL MAMMARY ARTERY TO THE LAD. EF WAS NORMAL   INGUINAL HERNIA REPAIR Right    LAPAROSCOPIC APPENDECTOMY N/A 06/28/2016   Procedure: APPENDECTOMY LAPAROSCOPIC;  Surgeon: Leighton Ruff, MD;  Location: [facility];  Service: General;  Laterality: N/A;   NM MYOVIEW LTD  06/2010   Fixed anteroseptal, apical and inferoapical defect with moderate size. Most likely scar. Mild subendocardial ischemia. EF 71% LOW RISK.    SPLENECTOMY     TOTAL HIP ARTHROPLASTY Left 01/23/2022   Procedure: LEFT TOTAL HIP ARTHROPLASTY ANTERIOR APPROACH;  Surgeon: Leandrew Koyanagi, MD;  Location: [facility];  Service: Orthopedics;  Laterality: Left;  3-C   TRANSTHORACIC ECHOCARDIOGRAM  12/2014   [facility]: Normal LV size & function. EF 55-60%,    vagina polyp     VIDEO ASSISTED THORACOSCOPY (VATS)/WEDGE RESECTION Left 03/02/2015   Procedure: VIDEO ASSISTED THORACOSCOPY (VATS), MINI THORACOTOMY, LEFT UPPER LOBE WEDGE, TAKE DOWN OF INTERNAL MAMMARY LESIONS, PLACEMENT OF ON-Q PUMP;  Surgeon: Grace Isaac, MD;  Location: [facility];  Service: Thoracic;  Laterality: Left;   VIDEO BRONCHOSCOPY N/A 03/02/2015   Procedure: BRONCHOSCOPY;  Surgeon: Grace Isaac, MD;  Location: [facility];  Service: Thoracic;  Laterality: N/A;   VIDEO BRONCHOSCOPY WITH ENDOBRONCHIAL NAVIGATION N/A 10/08/2017   Procedure: VIDEO BRONCHOSCOPY WITH ENDOBRONCHIAL NAVIGATION WITH BIOPSIES OF LEFT UPPER LOBE AND LEFT LOWER LOBE;  Surgeon: Grace Isaac, MD;  Location: [facility];  Service: Thoracic;  Laterality: N/A;    Allergies  Allergen Reactions   Nsaids Other (See Comments)    Stomach upset Told to avoid due to Eliquis   Aspirin Other (See Comments)    Stomach upset Told to avoid due to Eliquis   Codeine Nausea And Vomiting   Nitrostat [Nitroglycerin] Other (See Comments)    Bradycardia     No facility-administered encounter medications  on file as of 02/15/2022.   Outpatient Encounter Medications as of 02/15/2022  Medication Sig   acetaminophen (TYLENOL) 325 MG tablet Take 650 mg by mouth every 6 (six) hours as needed (generalized pain).   albuterol (VENTOLIN HFA) 108 (90 Base) MCG/ACT inhaler Inhale 1-2 puffs into the lungs every 4 (four) hours as needed for wheezing or shortness of breath.   amLODipine (NORVASC) 2.5 MG tablet Take 1 tablet (2.5 mg total) by mouth daily.   apixaban (ELIQUIS) 5 MG TABS tablet Take 5 mg by mouth 2 (two) times daily.   ascorbic acid (VITAMIN C) 500 MG tablet Take 500 mg by mouth daily.   atorvastatin (LIPITOR) 80 MG tablet Take 80 mg by mouth daily.   Cholecalciferol (VITAMIN D3) 50 MCG (2000 UT) capsule Take 1 capsule (2,000 Units total) by mouth daily.   Cyanocobalamin 2500 MCG TABS Take 2,500 mcg by mouth daily.   famotidine (PEPCID)  20 MG tablet Take 20 mg by mouth 2 (two) times daily.   gabapentin (NEURONTIN) 300 MG capsule Take 1 capsule (300 mg total) by mouth at bedtime.   Golimumab 50 MG/0.5ML SOAJ Take 50 mg by mouth every 2 (two) months. Hold for the next 2 weeks per Orthopedic surgery recommendations.   hydrochlorothiazide (HYDRODIURIL) 12.5 MG tablet TAKE 1 TABLET BY MOUTH EVERY DAY (Patient taking differently: Take 12.5 mg by mouth daily.)   hydrocortisone (ANUSOL-HC) 2.5 % rectal cream Place rectally 2 (two) times daily. (Patient taking differently: Place 1 Application rectally 2 (two) times daily.)   leflunomide (ARAVA) 20 MG tablet Take 20 mg by mouth daily.    levothyroxine (SYNTHROID) 88 MCG tablet TAKE 1 TABLET (88 MCG TOTAL) BY MOUTH DAILY BEFORE BREAKFAST.   metoprolol tartrate (LOPRESSOR) 50 MG tablet Take 50 mg by mouth 2 (two) times daily.   Multiple Vitamins-Minerals (CENTRUM ADULT PO) Take 1 tablet by mouth daily. 1 Tablet Daily.   pantoprazole (PROTONIX) 40 MG tablet Take 40 mg by mouth daily.   sennosides-docusate sodium (SENOKOT-S) 8.6-50 MG tablet Take 2 tablets by  mouth 2 (two) times daily.   tamsulosin (FLOMAX) 0.4 MG CAPS capsule Take 1 capsule (0.4 mg total) by mouth daily.   Tiotropium Bromide-Olodaterol (STIOLTO RESPIMAT) 2.5-2.5 MCG/ACT AERS Inhale 2 each into the lungs at bedtime.   triamcinolone (NASACORT) 55 MCG/ACT AERO nasal inhaler Place 1 spray into the nose daily.   [DISCONTINUED] apixaban (ELIQUIS) 5 MG TABS tablet Take 2 tablets (10 mg total) by mouth 2 (two) times daily for 4 days, THEN 1 tablet (5 mg total) 2 (two) times daily.   [DISCONTINUED] docusate sodium (COLACE) 100 MG capsule Take 100 mg by mouth 2 (two) times daily.    Review of Systems  Constitutional:  Negative for appetite change, chills, fatigue and fever.  HENT:  Negative for congestion, hearing loss, rhinorrhea and sore throat.   Eyes: Negative.   Respiratory:  Negative for cough, shortness of breath and wheezing.   Cardiovascular:  Positive for leg swelling. Negative for chest pain and palpitations.  Gastrointestinal:  Positive for anal bleeding and constipation. Negative for abdominal pain, diarrhea, nausea and vomiting.  Genitourinary:  Negative for dysuria.  Musculoskeletal:  Negative for arthralgias, back pain and myalgias.  Skin:  Negative for color change, rash and wound.  Neurological:  Negative for dizziness, weakness and headaches.  Psychiatric/Behavioral:  Negative for behavioral problems. The patient is not nervous/anxious.      Immunization History  Administered Date(s) Administered   Fluad Quad(high Dose 65+) 02/03/2021, 01/16/2022   Influenza Whole 05/02/2001, 01/27/2008, 01/12/2010, 12/31/2010, 01/25/2012   Influenza, High Dose Seasonal PF 01/14/2014, 02/13/2015, 12/21/2016, 01/09/2018, 12/29/2018   Influenza,inj,Quad PF,6+ Mos 12/23/2015   Influenza-Unspecified 02/03/2013, 01/13/2014, 02/13/2015, 01/20/2020   Meningococcal Polysaccharide 03/21/2012   Moderna SARS-COV2 Booster Vaccination 03/15/2020   Moderna Sars-Covid-2 Vaccination 06/02/2019,  06/30/2019   PPD Test 11/18/2013   Pfizer Covid-19 Vaccine Bivalent Booster 58yr & up 01/19/2021, 10/12/2021   Pneumococcal Conjugate-13 03/20/2013   Pneumococcal Polysaccharide-23 05/02/2000, 06/23/2010, 02/26/2018   Td 05/01/2006   Tdap 06/01/2016   Zoster Recombinat (Shingrix) 05/16/2017, 07/20/2017   Zoster, Live 01/17/2006   Pertinent  Health Maintenance Due  Topic Date Due   COLONOSCOPY (Pts 45-466yrInsurance coverage will need to be confirmed)  03/30/2019   INFLUENZA VACCINE  Completed   DEXA SCAN  Completed      02/09/2022   10:00 AM 02/10/2022    4:00 AM 02/10/2022  8:00 AM 02/10/2022    9:04 PM 02/16/2022   11:53 AM  Fall Risk  Patient Fall Risk Level High fall risk High fall risk High fall risk High fall risk High fall risk     Vitals:   02/15/22 1605  BP: 100/66  Pulse: 96  Resp: 19  Temp: (!) 97.2 F (36.2 C)  SpO2: 91%  Weight: 159 lb (72.1 kg)  Height: '5\' 3"'$  (1.6 m)   Body mass index is 28.17 kg/m.  Physical Exam Constitutional:      Appearance: Normal appearance.  HENT:     Head: Normocephalic and atraumatic.     Nose: Nose normal.     Mouth/Throat:     Mouth: Mucous membranes are moist.  Eyes:     Conjunctiva/sclera: Conjunctivae normal.  Cardiovascular:     Rate and Rhythm: Normal rate and regular rhythm.  Pulmonary:     Effort: Pulmonary effort is normal.     Breath sounds: Normal breath sounds.  Abdominal:     General: Bowel sounds are normal.     Palpations: Abdomen is soft.  Musculoskeletal:        General: Normal range of motion.     Cervical back: Normal range of motion.     Left lower leg: Edema present.     Comments: LLE 2+edema  Skin:    General: Skin is warm and dry.  Neurological:     General: No focal deficit present.     Mental Status: She is alert and oriented to person, place, and time.  Psychiatric:        Mood and Affect: Mood normal.        Behavior: Behavior normal.        Thought Content: Thought  content normal.        Judgment: Judgment normal.      Labs reviewed: Recent Labs    02/06/22 1529 02/07/22 0315 02/08/22 0606 02/16/22 1205  NA 133* 134* 134* 137  K 4.3 3.7 3.8 4.8  CL 98 102 100 104  CO2 20* 19* 20* 22  GLUCOSE 122* 89 117* 129*  BUN 33* 30* 35* 43*  CREATININE 1.89* 1.90* 1.91* 2.22*  CALCIUM 8.2* 7.9* 8.7* 8.8*  MG 1.3*  --   --   --    Recent Labs    02/06/22 1529 02/07/22 0315 02/16/22 1205  AST 63* 48* 25  ALT 36 29 19  ALKPHOS 123 110 74  BILITOT 0.5 0.7 0.3  PROT 6.5 5.2* 4.8*  ALBUMIN 2.4* 2.1* 2.3*   Recent Labs    12/06/21 0948 01/03/22 0952 01/23/22 0644 02/09/22 0037 02/10/22 0143 02/16/22 1205  WBC 8.5 8.6   < > 9.0 9.1 7.6  NEUTROABS 3.6 4.0  --   --   --  3.6  HGB 11.0* 12.2   < > 7.5* 7.8* 6.1*  HCT 32.6* 37.1   < > 21.9* 22.8* 18.1*  MCV 97.3 99.0   < > 94.4 95.0 97.3  PLT 314.0 349.0   < > 404* 444* 460*   < > = values in this interval not displayed.   Lab Results  Component Value Date   TSH 0.985 02/16/2022   No results found for: "HGBA1C" Lab Results  Component Value Date   CHOL 157 09/07/2020   HDL 59.30 09/07/2020   LDLCALC 74 09/07/2020   LDLDIRECT 162.1 10/28/2009   TRIG 121.0 09/07/2020   CHOLHDL 3 09/07/2020    Significant Diagnostic Results in last  30 days:  DG Chest Portable 1 View  Result Date: 02/16/2022 CLINICAL DATA:  GI bleeding EXAM: PORTABLE CHEST 1 VIEW COMPARISON:  Previous studies including the examination of 02/06/2022 FINDINGS: Transverse diameter of heart is slightly increased. There is previous coronary bypass surgery. There are no signs of pulmonary edema. There is blunting of lateral CP angles. Small linear densities are seen in right lower lung field. There is no pneumothorax. There is previous surgical fusion in cervical spine. IMPRESSION: Minimal bilateral pleural effusions. Linear densities in right lower lung fields suggest subsegmental atelectasis. Electronically Signed   By:  Elmer Picker M.D.   On: 02/16/2022 14:12   VAS [place] LOWER EXTREMITY VENOUS (DVT)  Result Date: 02/07/2022  Lower Venous DVT Study Patient Name:  [name]  Date of Exam:   02/07/2022 Medical Rec #: [id number]       Accession #:    [id number] Date of Birth: [birth year]        Patient Gender: F Patient Age:   58 years Exam [facility]  [facility] Procedure:      VAS [place] LOWER EXTREMITY VENOUS (DVT) Referring Phys: Sheppard Coil MELVIN --------------------------------------------------------------------------------  Indications: Edema.  Limitations: Pain tolerance. Comparison Study: 01/28/22 prior Performing Technologist: Archie Patten RVS  Examination Guidelines: A complete evaluation includes B-mode imaging, spectral Doppler, color Doppler, and power Doppler as needed of all accessible portions of each vessel. Bilateral testing is considered an integral part of a complete examination. Limited examinations for reoccurring indications may be performed as noted. The reflux portion of the exam is performed with the patient in reverse Trendelenburg.  +---------+---------------+---------+-----------+----------+-------------------+ RIGHT    CompressibilityPhasicitySpontaneityPropertiesThrombus Aging      +---------+---------------+---------+-----------+----------+-------------------+ CFV      Full           Yes      Yes                                      +---------+---------------+---------+-----------+----------+-------------------+ SFJ      Full                                                             +---------+---------------+---------+-----------+----------+-------------------+ FV Prox  Full                                                             +---------+---------------+---------+-----------+----------+-------------------+ FV Mid                  Yes      Yes                  unable to tolerate                                                         compression due to  pain tolerance-                                                           patent by color                                                           doppler             +---------+---------------+---------+-----------+----------+-------------------+ FV Distal               Yes      Yes                  unable to tolerate                                                        compression due to                                                        pain tolerance-                                                           patent by color                                                           doppler             +---------+---------------+---------+-----------+----------+-------------------+ PFV      Full                                                             +---------+---------------+---------+-----------+----------+-------------------+ POP      Full           Yes      Yes                                      +---------+---------------+---------+-----------+----------+-------------------+ PTV      Full                                                             +---------+---------------+---------+-----------+----------+-------------------+  PERO     Full           Yes      Yes                  patent by color                                                           doppler             +---------+---------------+---------+-----------+----------+-------------------+   +---------+---------------+---------+-----------+----------+-------------------+ LEFT     CompressibilityPhasicitySpontaneityPropertiesThrombus Aging      +---------+---------------+---------+-----------+----------+-------------------+ CFV      Full           Yes      Yes                                       +---------+---------------+---------+-----------+----------+-------------------+ SFJ      Full                                                             +---------+---------------+---------+-----------+----------+-------------------+ FV Prox  Full                                                             +---------+---------------+---------+-----------+----------+-------------------+ FV Mid                  Yes      Yes                  unable to tolerate                                                        compression due to                                                        pain tolerance-                                                           patent by color  doppler             +---------+---------------+---------+-----------+----------+-------------------+ FV Distal               Yes      Yes                  unable to tolerate                                                        compression due to                                                        pain tolerance-                                                           patent by color                                                           doppler             +---------+---------------+---------+-----------+----------+-------------------+ PFV      Full                                                             +---------+---------------+---------+-----------+----------+-------------------+ POP      Full           Yes      Yes                                      +---------+---------------+---------+-----------+----------+-------------------+ PTV      Full                                                             +---------+---------------+---------+-----------+----------+-------------------+ PERO     Full                                                              +---------+---------------+---------+-----------+----------+-------------------+     Summary: RIGHT: - There is no evidence of deep vein thrombosis in the lower extremity.  - A cystic structure is found in the popliteal  fossa.  LEFT: - There is no evidence of deep vein thrombosis in the lower extremity.  - No cystic structure found in the popliteal fossa.  *See table(s) above for measurements and observations. Electronically signed by Deitra Mayo MD on 02/07/2022 at 6:17:09 PM.    Final    ECHOCARDIOGRAM COMPLETE  Result Date: 02/07/2022    ECHOCARDIOGRAM REPORT   Patient Name:   LINLEY MOSKAL Date of Exam: 02/07/2022 Medical Rec #:  [id number]      Height:       63.0 in Accession #:    [id number]     Weight:       158.7 lb Date of Birth:  02-08-1941       BSA:          1.753 m Patient Age:    89 years       BP:           120/41 mmHg Patient Gender: F              HR:           79 bpm. Exam Location:  Inpatient Procedure: 2D Echo, 3D Echo, Cardiac Doppler and Color Doppler Indications:    Pulmonary Embolus I26.09  History:        Patient has prior history of Echocardiogram examinations, most                 recent 03/10/2019. CAD, Prior CABG, COPD and TIA; Risk                 Factors:Hypertension. History of AAA repair, GERD, GERD, Chronic                 kidney disease. Mesenteric artery stenosis.  Sonographer:    Darlina Sicilian RDCS Referring Phys: [id number] [place]  1. Left ventricular ejection fraction, by estimation, is 65 to 70%. Left ventricular ejection fraction by 3D volume is 68 %. The left ventricle has normal function. The left ventricle has no regional wall motion abnormalities. There is severe asymmetric  left ventricular hypertrophy. Left ventricular diastolic parameters are consistent with Grade II diastolic dysfunction (pseudonormalization).  2. Right ventricular systolic function is normal. The right ventricular size is normal. There is normal pulmonary artery  systolic pressure.  3. The mitral valve is normal in structure. Trivial mitral valve regurgitation. No evidence of mitral stenosis.  4. The aortic valve is tricuspid. There is mild thickening of the aortic valve. Aortic valve regurgitation is not visualized. Comparison(s): Prior images reviewed side by side. MR has improved. FINDINGS  Left Ventricle: Left ventricular ejection fraction, by estimation, is 65 to 70%. Left ventricular ejection fraction by 3D volume is 68 %. The left ventricle has normal function. The left ventricle has no regional wall motion abnormalities. The left ventricular internal cavity size was small. There is severe asymmetric left ventricular hypertrophy. Left ventricular diastolic parameters are consistent with Grade II diastolic dysfunction (pseudonormalization). Right Ventricle: The right ventricular size is normal. No increase in right ventricular wall thickness. Right ventricular systolic function is normal. There is normal pulmonary artery systolic pressure. The tricuspid regurgitant velocity is 2.16 m/s, and  with an assumed right atrial pressure of 3 mmHg, the estimated right ventricular systolic pressure is 67.2 mmHg. Left Atrium: Left atrial size was normal in size. Right Atrium: Right atrial size was normal in size. Pericardium: There is no evidence of pericardial effusion. Presence of epicardial fat layer. Mitral Valve: The mitral valve  is normal in structure. Trivial mitral valve regurgitation. No evidence of mitral valve stenosis. Tricuspid Valve: The tricuspid valve is normal in structure. Tricuspid valve regurgitation is mild . No evidence of tricuspid stenosis. Aortic Valve: The aortic valve is tricuspid. There is mild thickening of the aortic valve. Aortic valve regurgitation is not visualized. Pulmonic Valve: The pulmonic valve was normal in structure. Pulmonic valve regurgitation is mild. No evidence of pulmonic stenosis. Aorta: The aortic root and ascending aorta are  structurally normal, with no evidence of dilitation. IAS/Shunts: No atrial level shunt detected by color flow Doppler.  LEFT VENTRICLE PLAX 2D LVIDd:         3.60 cm         Diastology LVIDs:         2.30 cm         LV e' medial:    4.05 cm/s LV PW:         1.10 cm         LV E/e' medial:  17.2 LV IVS:        1.50 cm         LV e' lateral:   4.87 cm/s LVOT diam:     2.40 cm         LV E/e' lateral: 14.3 LV SV:         55 LV SV Index:   31 LVOT Area:     4.52 cm        3D Volume EF                                LV 3D EF:    Left                                             ventricul                                             ar                                             ejection                                             fraction                                             by 3D                                             volume is  68 %.                                 3D Volume EF:                                3D EF:        68 %                                LV EDV:       96 ml                                LV ESV:       30 ml                                LV SV:        66 ml RIGHT VENTRICLE RV Basal diam:  3.80 cm RV Mid diam:    2.00 cm RV S prime:     12.90 cm/s TAPSE (M-mode): 1.4 cm LEFT ATRIUM             Index        RIGHT ATRIUM           Index LA diam:        4.00 cm 2.28 cm/m   RA Area:     13.60 cm LA Vol (A2C):   55.7 ml 31.78 ml/m  RA Volume:   33.30 ml  19.00 ml/m LA Vol (A4C):   33.5 ml 19.11 ml/m LA Biplane Vol: 43.5 ml 24.82 ml/m  AORTIC VALVE LVOT Vmax:   72.40 cm/s LVOT Vmean:  48.500 cm/s LVOT VTI:    0.122 m  AORTA Ao Root diam: 3.20 cm Ao Asc diam:  2.80 cm MITRAL VALVE               TRICUSPID VALVE MV Area (PHT): 3.43 cm    TR Peak grad:   18.7 mmHg MV Decel Time: 221 msec    TR Vmax:        216.00 cm/s MV E velocity: 69.80 cm/s MV A velocity: 69.40 cm/s  SHUNTS MV E/A ratio:  1.01        Systemic VTI:  0.12 m                             Systemic Diam: 2.40 cm Rudean Haskell MD Electronically signed by Rudean Haskell MD Signature Date/Time: 02/07/2022/1:27:00 PM    Final    CT Angio Chest PE W and/or Wo Contrast  Result Date: 02/06/2022 CLINICAL DATA:  Provided history: Pulmonary embolism (PE) suspected, high prob reduce dose contrast Dural venous and constipation. Recent hip replacement. EXAM: CT ANGIOGRAPHY CHEST WITH CONTRAST TECHNIQUE: Multidetector CT imaging of the chest was performed using the standard protocol during bolus administration of intravenous contrast. Multiplanar CT image reconstructions and MIPs were obtained to evaluate the vascular anatomy. RADIATION DOSE REDUCTION: This exam was performed according to the departmental dose-optimization program which includes automated exposure control, adjustment of the mA and/or kV according to patient size and/or use of iterative reconstruction technique. CONTRAST:  80  cc Omnipaque 350 IV COMPARISON:  Radiograph earlier today. Chest CT 03/17/2021. FINDINGS: Cardiovascular: Isolated subsegmental embolus in the medial right middle lobe pulmonary artery, for example series 9, image 171 and series 10, image 58. No additional pulmonary emboli. Calcified and noncalcified atheromatous plaque throughout the thoracic aorta. No aneurysm or dissection. Post CABG. Upper normal heart size. No pericardial effusion. Mediastinum/Nodes: No enlarged mediastinal or hilar lymph nodes. No visible thyroid nodule. Tiny hiatal hernia. Lungs/Pleura: Small right and trace left pleural effusion. Associated atelectasis. The previous subpleural right lower lobe nodule is obscured on the current exam due to pleural fluid and atelectasis. Postsurgical change in the left upper lobe. There is mild bronchial thickening in the lower lobes. Upper Abdomen: No acute upper abdominal findings. Elongated left lobe of the liver. Musculoskeletal: Stable punctate sclerotic focus within T4 vertebral body likely a bone  island. Median sternotomy. No acute osseous findings. Review of the MIP images confirms the above findings. IMPRESSION: 1. Isolated subsegmental embolus in the medial right middle lobe pulmonary artery. 2. Small right and trace left pleural effusion. Associated atelectasis. 3. Mild bronchial thickening in the lower lobes. Aortic Atherosclerosis (ICD10-I70.0). Critical Value/emergent results were called by telephone at the time of interpretation on 02/06/2022 at 10:25 pm to provider DAVID YAO , who verbally acknowledged these results. Electronically Signed   By: Keith Rake M.D.   On: 02/06/2022 22:26   CT Head Wo Contrast  Result Date: 02/06/2022 CLINICAL DATA:  Mental status change, unknown cause EXAM: CT HEAD WITHOUT CONTRAST TECHNIQUE: Contiguous axial images were obtained from the base of the skull through the vertex without intravenous contrast. RADIATION DOSE REDUCTION: This exam was performed according to the departmental dose-optimization program which includes automated exposure control, adjustment of the mA and/or kV according to patient size and/or use of iterative reconstruction technique. COMPARISON:  02/15/2020 FINDINGS: Brain: No evidence of acute infarction, hemorrhage, mass, mass effect, or midline shift. No hydrocephalus or extra-axial fluid collection. Mildly advanced cerebral atrophy for age. Vascular: No hyperdense vessel. Skull: Normal. Negative for fracture or focal lesion. Sinuses/Orbits: No acute finding. Status post bilateral lens replacements. Other: The mastoid air cells are well aerated. IMPRESSION: No acute intracranial process. Electronically Signed   By: Merilyn Baba M.D.   On: 02/06/2022 14:50   DG Chest 2 View  Result Date: 02/06/2022 CLINICAL DATA:  Cough EXAM: CHEST - 2 VIEW COMPARISON:  Chest x-ray dated Sep 26, 2019 FINDINGS: Cardiac and mediastinal contours are within normal limits. Prior median sternotomy and CABG. Small bilateral pleural effusions and bibasilar  atelectasis. No evidence of pneumothorax. IMPRESSION: Small bilateral pleural effusions and bibasilar atelectasis. Electronically Signed   By: Yetta Glassman M.D.   On: 02/06/2022 14:49   VAS [place] LOWER EXTREMITY VENOUS (DVT)  Result Date: 01/28/2022  Lower Venous DVT Study Patient Name:  [name]  Date of Exam:   01/28/2022 Medical Rec #: [id number]       Accession #:    [id number] Date of Birth: 1941-04-16        Patient Gender: F Patient Age:   28 years Exam Location:  [facility] Procedure:      VAS [place] LOWER EXTREMITY VENOUS (DVT) Referring Phys: Gloriann Loan --------------------------------------------------------------------------------  Indications: Edema, and Swelling.  Comparison Study: no prior Performing Technologist: Archie Patten RVS  Examination Guidelines: A complete evaluation includes B-mode imaging, spectral Doppler, color Doppler, and power Doppler as needed of all accessible portions of each vessel. Bilateral testing is considered an  integral part of a complete examination. Limited examinations for reoccurring indications may be performed as noted. The reflux portion of the exam is performed with the patient in reverse Trendelenburg.  +--------+---------------+---------+-----------+----------+--------------------+ RIGHT   CompressibilityPhasicitySpontaneityPropertiesThrombus Aging       +--------+---------------+---------+-----------+----------+--------------------+ CFV     Full           Yes      Yes                                       +--------+---------------+---------+-----------+----------+--------------------+ SFJ     Full                                                              +--------+---------------+---------+-----------+----------+--------------------+ FV Prox Full                                                              +--------+---------------+---------+-----------+----------+--------------------+ FV Mid  Full                                                               +--------+---------------+---------+-----------+----------+--------------------+ FV      Full                                                              Distal                                                                    +--------+---------------+---------+-----------+----------+--------------------+ PFV     Full                                                              +--------+---------------+---------+-----------+----------+--------------------+ POP     Full           Yes      Yes                                       +--------+---------------+---------+-----------+----------+--------------------+ PTV     Full                                                              +--------+---------------+---------+-----------+----------+--------------------+  PERO    None                                         in a single paired                                                        vein                 +--------+---------------+---------+-----------+----------+--------------------+   +---------+---------------+---------+-----------+----------+--------------+ LEFT     CompressibilityPhasicitySpontaneityPropertiesThrombus Aging +---------+---------------+---------+-----------+----------+--------------+ CFV      Full           Yes      Yes                                 +---------+---------------+---------+-----------+----------+--------------+ SFJ      Full                                                        +---------+---------------+---------+-----------+----------+--------------+ FV Prox  Full                                                        +---------+---------------+---------+-----------+----------+--------------+ FV Mid   Full                                                        +---------+---------------+---------+-----------+----------+--------------+ FV  DistalFull                                                        +---------+---------------+---------+-----------+----------+--------------+ PFV      Full                                                        +---------+---------------+---------+-----------+----------+--------------+ POP      Full           Yes      Yes                                 +---------+---------------+---------+-----------+----------+--------------+ PTV      Full                                                        +---------+---------------+---------+-----------+----------+--------------+  PERO     Full                                                        +---------+---------------+---------+-----------+----------+--------------+     Summary: RIGHT: - 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 - A cystic structure is found in the popliteal fossa.  LEFT: - There is no evidence of deep vein thrombosis in the lower extremity.  - No cystic structure found in the popliteal fossa.  *See table(s) above for measurements and observations. Electronically signed by Deitra Mayo MD on 01/28/2022 at 4:00:42 PM.    Final    DG Pelvis Portable  Result Date: 01/23/2022 CLINICAL DATA:  Postop left hip arthroplasty. EXAM: PORTABLE PELVIS 1-2 VIEWS COMPARISON:  Left hip radiographs 10/06/2021 FINDINGS: Sequelae of interval left total hip arthroplasty are identified. Gas is noted in the adjacent soft tissues. No acute fracture or dislocation is evident on this single image. There is mild superior hip joint space narrowing on the right. IMPRESSION: Left total hip arthroplasty without evidence of acute osseous abnormality. Electronically Signed   By: Logan Bores M.D.   On: 01/23/2022 10:43   DG HIP UNILAT WITH PELVIS 1V LEFT  Result Date: 01/23/2022 CLINICAL DATA:  Fluoroscopy provided during hip replacement. Fluoroscopy time: 24 seconds. Cumulative  air kerma: 2.49 mGy EXAM: DG HIP (WITH OR WITHOUT PELVIS) 1V*L* COMPARISON:  None Available. FINDINGS: Four images were obtained during left hip replacement. By the end of the study, the left hip is been replaced in hardware is in good position on frontal imaging. IMPRESSION: Fluoroscopy provided during left hip replacement as above. Electronically Signed   By: Dorise Bullion III M.D.   On: 01/23/2022 09:07   DG C-Arm 1-60 Min-No Report  Result Date: 01/23/2022 Fluoroscopy was utilized by the requesting physician.  No radiographic interpretation.   DG C-Arm 1-60 Min-No Report  Result Date: 01/23/2022 Fluoroscopy was utilized by the requesting physician.  No radiographic interpretation.    Assessment/Plan  1. Acute blood loss anemia -  hgb 8.1, up from 7.8 -  had a recent left total hip arthrplasty -  will start on FeSO4 325 mg daily and Vitamin C 500 mg daily -  repeat CBC on 02/17/22  2. Slow transit constipation -   discontinue Colace -  start Senna-S 8.6-50 mg 2 tabs PO BID  3. History of total left hip arthroplasty -   continue PT and OT, for therapeutic strengthening exercises  4. Acute PE -   continue Eliquis -  monitor for shortness of breath   Family/ staff Communication: Discussed plan of care with resident and charge nurse.  Labs/tests ordered:   CBC on 02/17/22    Durenda Age, DNP, MSN, FNP-BC [facility] [phone] (Monday-Friday 8:00 a.m. - 5:00 p.m.) [phone] (after hours)

## 2022-02-16 NOTE — ED Provider Notes (Signed)
[facility] EMERGENCY DEPARTMENT Provider Note   CSN: [id number] Arrival date & time: 02/16/22  1145     History  Chief Complaint  Patient presents with   GI Bleeding    [name] is a 81 y.o. female.  Patient as above with significant medical history as below, including CKD, COPD, diverticulosis, HLD, HTN, hypothyroid, PE on DOAC who presents to the ED with complaint of rectal bleeding. Pt with hx hemorrhoids, she has been having ongoing brbpr for the last 2 days, she is on DOAC for PE. She has no rectal pain, no abd pain, no n/v, no hematemesis, no vaginal bleeding or hematuria was reported. Does report when she stands up she has the sensation of fluid leaking from her bottom/noticed bright red blood in her brief. Similar sensation when she had diverticular bleed back in July. Last dose of DOAC was yesterday, did not receive this AM.  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 Most likely scar. Mild subendocardial ischemia. EF 71% LOW RISK.    Cervical disc disease    fracture   Chronic kidney disease    COPD (chronic obstructive pulmonary disease) (HCC)    Diverticulosis    Hyperlipidemia    Hypertension    Hypothyroidism (acquired)    hypo   Myocardial infarction ([place]) 11/2014    TIA   Polymyalgia rheumatica ([place])    2011 Dr. Charlestine Night   Rheumatoid arthritis [facility]) 2011   Dr.Truslow; fracture knees, hands and wrists -    S/P CABG x 1 1996   CABG--LIMA-LAD for ostial LAD (not felt to be PCI amenable). EF NORMAL then; LIMA now atretic   Shortness of breath dyspnea    with exertion   Stroke ([place]) 11/2014   TIA    Urinary frequency     Past Surgical History:  Procedure Laterality Date   ABDOMINAL AORTIC ANEURYSM REPAIR  7628   Complicated by mesenteric artery stenosis and splenic artery infarction with acquired Asplenia   APPENDECTOMY     BUNIONECTOMY  07/2011   right foot   CARDIAC CATHETERIZATION  2005   (Most recent CATH) - ostial LAD lesion 20-30% (down from 90% initially). Atretic LIMA. Minimal disease the RCA and Circumflex system.   CARPAL TUNNEL RELEASE Left    CATARACT EXTRACTION Bilateral    CERVICAL SPINE SURGERY     plate 2008 Dr. Saintclair Halsted   COLONOSCOPY     CORONARY ARTERY BYPASS GRAFT  1996   INCLUDED AN INTERNAL MAMMARY ARTERY TO THE LAD. EF WAS NORMAL   INGUINAL HERNIA REPAIR Right    LAPAROSCOPIC APPENDECTOMY N/A 06/28/2016   Procedure: APPENDECTOMY LAPAROSCOPIC;  Surgeon: Leighton Ruff, MD;  Location: [facility];  Service: General;  Laterality: N/A;   NM MYOVIEW LTD  06/2010   Fixed anteroseptal, apical and inferoapical defect with moderate size. Most likely scar. Mild subendocardial ischemia. EF 71% LOW RISK.    SPLENECTOMY     TOTAL HIP ARTHROPLASTY Left 01/23/2022   Procedure: LEFT TOTAL HIP ARTHROPLASTY ANTERIOR APPROACH;  Surgeon: Leandrew Koyanagi, MD;  Location: [place];  Service: Orthopedics;  Laterality: Left;  3-C   TRANSTHORACIC ECHOCARDIOGRAM  12/2014   [facility]: Normal LV size & function. EF 55-60%,    vagina polyp     VIDEO ASSISTED THORACOSCOPY (VATS)/WEDGE RESECTION Left 03/02/2015   Procedure: VIDEO ASSISTED THORACOSCOPY (VATS), MINI THORACOTOMY, LEFT UPPER LOBE WEDGE, TAKE DOWN OF INTERNAL MAMMARY LESIONS, PLACEMENT OF  ON-Q PUMP;  Surgeon: Grace Isaac, MD;  Location: [place];  Service: Thoracic;  Laterality: Left;   VIDEO BRONCHOSCOPY N/A 03/02/2015   Procedure: BRONCHOSCOPY;  Surgeon: Grace Isaac, MD;  Location: [place];  Service: Thoracic;  Laterality: N/A;   VIDEO BRONCHOSCOPY WITH ENDOBRONCHIAL NAVIGATION N/A 10/08/2017   Procedure: VIDEO BRONCHOSCOPY WITH ENDOBRONCHIAL NAVIGATION WITH BIOPSIES OF LEFT UPPER LOBE AND LEFT LOWER LOBE;  Surgeon: Grace Isaac, MD;  Location: [place];  Service: Thoracic;  Laterality: N/A;     The history is provided by the patient. No language interpreter was used.       Home Medications Prior to Admission medications   Medication Sig Start Date End Date Taking? Authorizing Provider  acetaminophen (TYLENOL) 325 MG tablet Take 650 mg by mouth every 6 (six) hours as needed (generalized pain).   Yes [provider]  albuterol (VENTOLIN HFA) 108 (90 Base) MCG/ACT inhaler Inhale 1-2 puffs into the lungs every 4 (four) hours as needed for wheezing or shortness of breath.   Yes [provider]  amLODipine (NORVASC) 2.5 MG tablet Take 1 tablet (2.5 mg total) by mouth daily. 09/29/21  Yes Plotnikov, [name], MD  apixaban (ELIQUIS) 5 MG TABS tablet Take 5 mg by mouth 2 (two) times daily.   Yes [provider]  ascorbic acid (VITAMIN C) 500 MG tablet Take 500 mg by mouth daily.   Yes [provider]  atorvastatin (LIPITOR) 80 MG tablet Take 80 mg by mouth daily.   Yes [provider]  Cholecalciferol (VITAMIN D3) 50 MCG (2000 UT) capsule Take 1 capsule (2,000 Units total) by mouth daily. 11/13/18  Yes Plotnikov, [name], MD  Cyanocobalamin 2500 MCG TABS Take 2,500 mcg by mouth daily.   Yes [provider]  famotidine (PEPCID) 20 MG tablet Take 20 mg by mouth 2 (two) times daily.   Yes [provider]  gabapentin (NEURONTIN) 300 MG capsule Take 1 capsule (300 mg total) by mouth at bedtime. 02/11/22  Yes Mariel Aloe, MD  hydrochlorothiazide (HYDRODIURIL) 12.5 MG tablet TAKE 1 TABLET BY MOUTH EVERY DAY Patient taking differently: Take 12.5 mg by mouth daily. 04/08/21  Yes Tanda Rockers, MD  hydrocortisone (ANUSOL-HC) 2.5 % rectal cream Place rectally 2 (two) times daily. Patient taking differently: Place 1 Application rectally 2 (two) times daily. 02/11/22  Yes Mariel Aloe, MD  leflunomide (ARAVA) 20 MG tablet Take 20 mg by mouth daily.    Yes [provider]  levothyroxine (SYNTHROID) 88 MCG tablet TAKE 1 TABLET (88 MCG TOTAL) BY MOUTH DAILY BEFORE BREAKFAST. 01/09/22  Yes Plotnikov, [name], MD  metoprolol tartrate (LOPRESSOR) 50 MG tablet Take 50 mg by mouth 2 (two) times daily.  Yes [provider]  Multiple Vitamins-Minerals (CENTRUM ADULT PO) Take 1 tablet by mouth daily. 1 Tablet Daily.   Yes [provider]  pantoprazole (PROTONIX) 40 MG tablet Take 40 mg by mouth daily.   Yes [provider]  sennosides-docusate sodium (SENOKOT-S) 8.6-50 MG tablet Take 2 tablets by mouth 2 (two) times daily.   Yes [provider]  tamsulosin (FLOMAX) 0.4 MG CAPS capsule Take 1 capsule (0.4 mg total) by mouth daily. 02/12/22  Yes Mariel Aloe, MD  Tiotropium Bromide-Olodaterol (STIOLTO RESPIMAT) 2.5-2.5 MCG/ACT AERS Inhale 2 each into the lungs at bedtime.   Yes [provider]  triamcinolone (NASACORT) 55 MCG/ACT AERO nasal inhaler Place 1 spray into the nose daily.   Yes [provider]  Golimumab 50 MG/0.5ML SOAJ Take 50 mg by mouth every 2 (two) months. Hold for the next 2 weeks per Orthopedic surgery recommendations. 02/11/22   Mariel Aloe, MD      Allergies    Nsaids, Aspirin, Codeine, and Nitrostat [nitroglycerin]    Review of Systems   Review of Systems  Constitutional:  Positive for fatigue. Negative for activity change and fever.  HENT:  Negative for facial swelling and trouble swallowing.   Eyes:  Negative for discharge  and redness.  Respiratory:  Negative for cough and shortness of breath.   Cardiovascular:  Negative for chest pain and palpitations.  Gastrointestinal:  Positive for anal bleeding and blood in stool. Negative for abdominal pain and nausea.  Genitourinary:  Negative for dysuria and flank pain.  Musculoskeletal:  Negative for back pain and gait problem.  Skin:  Negative for pallor and rash.  Neurological:  Negative for syncope and headaches.    Physical Exam Updated Vital Signs BP (!) 144/56 (BP Location: Right Arm)   Pulse 73   Temp 98.1 F (36.7 C) (Oral)   Resp 16   SpO2 100%  Physical Exam Vitals and nursing note reviewed. Exam conducted with a chaperone present.  Constitutional:      General: She is not in acute distress.    Comments: Pale  HENT:     Head: Normocephalic and atraumatic.     Right Ear: External ear normal.     Left Ear: External ear normal.     Nose: Nose normal.     Mouth/Throat:     Mouth: Mucous membranes are moist.  Eyes:     General: No scleral icterus.       Right eye: No discharge.        Left eye: No discharge.     Comments: Conjunctival palloir  Cardiovascular:     Rate and Rhythm: Regular rhythm. Tachycardia present.     Pulses: Normal pulses.     Heart sounds: Normal heart sounds.  Pulmonary:     Effort: Pulmonary effort is normal. No tachypnea, accessory muscle usage or respiratory distress.     Breath sounds: Normal breath sounds.  Abdominal:     General: Abdomen is flat.     Palpations: Abdomen is soft.     Tenderness: There is no abdominal tenderness. There is no guarding or rebound.  Genitourinary:    Comments: Bright red blood noted in brief, clot noted on buttock. External hemorrhoid noted on exam but they do not appear to be acutely bleeding. No melena. Bleeding is not profuse Musculoskeletal:        General: Normal range of motion.     Cervical back: Normal range of motion.  Right lower leg: Edema present.     Left lower  leg: Edema present.  Skin:    General: Skin is warm and dry.     Capillary Refill: Capillary refill takes less than 2 seconds.     Coloration: Skin is pale.  Neurological:     Mental Status: She is alert and oriented to person, place, and time.     GCS: GCS eye subscore is 4. GCS verbal subscore is 5. GCS motor subscore is 6.  Psychiatric:        Mood and Affect: Mood normal.        Behavior: Behavior normal.     ED Results / Procedures / Treatments   Labs (all labs ordered are listed, but only abnormal results are displayed) Labs Reviewed  CBC WITH DIFFERENTIAL/PLATELET - Abnormal; Notable for the following components:      Result Value   RBC 1.86 (*)    Hemoglobin 6.1 (*)    HCT 18.1 (*)    RDW 16.9 (*)    Platelets 460 (*)    nRBC 1.9 (*)    Monocytes Absolute 1.1 (*)    Basophils Absolute 0.2 (*)    Abs Immature Granulocytes 0.13 (*)    All other components within normal limits  COMPREHENSIVE METABOLIC PANEL - Abnormal; Notable for the following components:   Glucose, Bld 129 (*)    BUN 43 (*)    Creatinine, Ser 2.22 (*)    Calcium 8.8 (*)    Total Protein 4.8 (*)    Albumin 2.3 (*)    GFR, Estimated 21 (*)    All other components within normal limits  PROTIME-INR - Abnormal; Notable for the following components:   Prothrombin Time 16.3 (*)    INR 1.3 (*)    All other components within normal limits  BRAIN NATRIURETIC PEPTIDE - Abnormal; Notable for the following components:   B Natriuretic Peptide 227.8 (*)    All other components within normal limits  HEMOGLOBIN AND HEMATOCRIT, BLOOD - Abnormal; Notable for the following components:   Hemoglobin 8.0 (*)    HCT 22.8 (*)    All other components within normal limits  RETICULOCYTES - Abnormal; Notable for the following components:   Retic Ct Pct 3.3 (*)    RBC. 2.49 (*)    Immature Retic Fract 27.3 (*)    All other components within normal limits  HEMOGLOBIN AND HEMATOCRIT, BLOOD - Abnormal; Notable for the  following components:   Hemoglobin 7.7 (*)    HCT 22.6 (*)    All other components within normal limits  IRON AND TIBC - Abnormal; Notable for the following components:   Saturation Ratios 39 (*)    All other components within normal limits  CBC - Abnormal; Notable for the following components:   RBC 3.12 (*)    Hemoglobin 9.6 (*)    HCT 28.7 (*)    RDW 16.4 (*)    Platelets 431 (*)    nRBC 1.6 (*)    All other components within normal limits  BASIC METABOLIC PANEL - Abnormal; Notable for the following components:   CO2 21 (*)    Glucose, Bld 120 (*)    BUN 35 (*)    Creatinine, Ser 2.03 (*)    GFR, Estimated 24 (*)    All other components within normal limits  POC OCCULT BLOOD, ED - Abnormal; Notable for the following components:   Fecal Occult Bld POSITIVE (*)    All other  components within normal limits  TSH  FERRITIN  OCCULT BLOOD X 1 CARD TO LAB, STOOL  TYPE AND SCREEN  PREPARE RBC (CROSSMATCH)    EKG None  Radiology DG Chest Portable 1 View  Result Date: 02/16/2022 CLINICAL DATA:  GI bleeding EXAM: PORTABLE CHEST 1 VIEW COMPARISON:  Previous studies including the examination of 02/06/2022 FINDINGS: Transverse diameter of heart is slightly increased. There is previous coronary bypass surgery. There are no signs of pulmonary edema. There is blunting of lateral CP angles. Small linear densities are seen in right lower lung field. There is no pneumothorax. There is previous surgical fusion in cervical spine. IMPRESSION: Minimal bilateral pleural effusions. Linear densities in right lower lung fields suggest subsegmental atelectasis. Electronically Signed   By: Elmer Picker M.D.   On: 02/16/2022 14:12    Procedures .Critical Care  Performed by: Jeanell Sparrow, DO Authorized by: Jeanell Sparrow, DO   Critical care provider statement:    Critical care time (minutes):  44   Critical care time was exclusive of:  Separately billable procedures and treating other  patients   Critical care was necessary to treat or prevent imminent or life-threatening deterioration of the following conditions:  Circulatory failure   Critical care was time spent personally by me on the following activities:  Development of treatment plan with patient or surrogate, discussions with consultants, evaluation of patient's response to treatment, examination of patient, ordering and review of laboratory studies, ordering and review of radiographic studies, ordering and performing treatments and interventions, pulse oximetry, re-evaluation of patient's condition, review of old charts and obtaining history from patient or surrogate   Care discussed with: admitting provider       Medications Ordered in ED Medications  acetaminophen (TYLENOL) tablet 650 mg (has no administration in time range)  atorvastatin (LIPITOR) tablet 80 mg (80 mg Oral Given 02/17/22 0823)  hydrALAZINE (APRESOLINE) tablet 25 mg (25 mg Oral Given 02/16/22 1622)  levothyroxine (SYNTHROID) tablet 88 mcg (88 mcg Oral Given 02/17/22 1340)  pantoprazole (PROTONIX) EC tablet 40 mg (40 mg Oral Given 02/17/22 0824)  senna-docusate (Senokot-S) tablet 2 tablet (2 tablets Oral Given 02/17/22 0833)  tamsulosin (FLOMAX) capsule 0.4 mg (0.4 mg Oral Given 02/17/22 0824)  cyanocobalamin (VITAMIN B12) tablet 1,000 mcg (1,000 mcg Oral Given 02/17/22 0826)  gabapentin (NEURONTIN) capsule 300 mg (300 mg Oral Given 02/16/22 2128)  triamcinolone (NASACORT) nasal inhaler 1 spray (1 spray Nasal Given 02/17/22 1109)  hydrocortisone (ANUSOL-HC) 2.5 % rectal cream ( Rectal Given 02/17/22 0833)  clotrimazole (GYNE-LOTRIMIN) vaginal cream 1 Applicatorful (1 Applicatorful Vaginal Given 02/17/22 0136)  albuterol (PROVENTIL) (2.5 MG/3ML) 0.083% nebulizer solution 2.5 mg (has no administration in time range)  famotidine (PEPCID) tablet 20 mg (20 mg Oral Given 02/17/22 1043)  arformoterol (BROVANA) nebulizer solution 15 mcg (15 mcg Nebulization  Given 02/17/22 0838)    And  umeclidinium bromide (INCRUSE ELLIPTA) 62.5 MCG/ACT 1 puff (1 puff Inhalation Given 02/17/22 0838)  metoprolol tartrate (LOPRESSOR) tablet 25 mg (25 mg Oral Given 02/17/22 1134)  0.9 %  sodium chloride infusion (10 mL/hr Intravenous New Bag/Given 02/16/22 1540)  sodium chloride 0.9 % bolus 500 mL (500 mLs Intravenous New Bag/Given 02/16/22 1614)    ED Course/ Medical Decision Making/ A&P Clinical Course as of 02/17/22 1527  Thu Feb 16, 2022  1302 [place] bleeding index is HIGH [SG]  5852 Spoke with GI, consider tagged rbc scan if bleeding persists, they will come to eval in consult [SG]  Clinical Course User Index [SG] Jeanell Sparrow, DO                           Medical Decision Making Amount and/or Complexity of Data Reviewed Labs: ordered. Radiology: ordered. ECG/medicine tests: ordered.  Risk Prescription drug management. Decision regarding hospitalization.   This patient presents to the ED with chief complaint(s) of rectal bleeding with pertinent past medical history of DOAC which further complicates the presenting complaint. The complaint involves an extensive differential diagnosis and also carries with it a high risk of complications and morbidity.    The differential diagnosis includes but not limited to diverticular bleed, hemorrhoidal bleed, LGIB, avm, etc. Serious etiologies were considered.   The initial plan is to screening labs/rectal exam, +/- angio vs bleeding scan   Additional history obtained: Additional history obtained from EMS  Records reviewed previous admission documents, Primary Care Documents, and home meds, prior consultant notes  Independent labs interpretation:  The following labs were independently interpreted:  Hgb 6.1, MCV 97. INR 1.3. pt with BRBPR    Independent visualization of imaging: - I independently visualized the following imaging with scope of interpretation limited to determining acute  life threatening conditions related to emergency care: CXR which revealed small effusions b/l, no pna or ptx  Pt's GFR is too low for CTA, may benefit from tagged RBC scan/bleeding scan, will d/w GI  Cardiac monitoring was reviewed and interpreted by myself which shows sinus tachy  Treatment and Reassessment: PRBC infusion x2 units 500cc NS >> stayed the same, hr did improve somewhat    Consultation: - Consulted or discussed management/test interpretation w/ external professional: GI  Consideration for admission or further workup: Admission was considered   81 yo female on DOAC for PE to ED with LGIB. She is tachycardic on assessment, BP is stable, HGB reduced from her baseline, today is 6.1, appears to have been down trending last 2-3 weeks from her baseline of around 12. Consented for [facility]. Will d/w GI. Will hold off on reversal for now pending GI eval; she appears stable at this time, will hold off on reversal unless bleeding becomes worse or she develops hemodynamic instability. Plan for admission.  Suspicion for diverticular bleed, pos hemorrhoidal. D/w Dr Roosevelt Locks who accepts pt for admission.   Social Determinants of health: Social History   Tobacco Use   Smoking status: Former    Types: Cigarettes    Quit date: 12/04/1958    Years since quitting: 63.2    Passive exposure: Never   Smokeless tobacco: Never  Vaping Use   Vaping Use: Never used  Substance Use Topics   Alcohol use: Yes    Comment: hardly ever   Drug use: No           Final Clinical Impression(s) / ED Diagnoses Final diagnoses:  Acute GI bleeding  Symptomatic anemia  Lower GI bleed  Anticoagulated    Rx / DC Orders ED Discharge Orders     None         Jeanell Sparrow, DO 02/17/22 1529

## 2022-02-16 NOTE — Assessment & Plan Note (Signed)
s/p CABG x1, LIMA LAD for ostial LAD, EF wnl. S/p aneurysm repair 

## 2022-02-16 NOTE — H&P (Signed)
History and Physical    [name] UGQ:[id number] DOB: January 25, 1941 DOA: 02/16/2022  PCP: Cassandria Anger, MD (Confirm with patient/family/NH records and if not entered, this has to be entered at [facility] point of entry) Patient coming from: Home  I have personally briefly reviewed patient's old medical records in [place]  Chief Complaint: Rectal bleeding  HPI: [name] is a 81 y.o. 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  Patient started noticed episode of blood in the stool about 6-7 days ago, intermittent, no abdominal pain denies any chest pain lightheadedness shortness of breath.  Increasedly, she noticed persistent rectal bleed, this morning, she had a large episode of rectal bleed with bright red blood, again denies any lightheadedness chest pain or shortness of breath.  No abdominal pain.  Her last dose of Eliquis was yesterday evening.  She called her PCP who directed her to ED. patient reported similar episode in July, when she was hospitalized and bleeding scan negative for active bleed, but patient was on Plavix at that time but not on Eliquis.  ED Course: Afebrile, borderline tachycardia, blood pressure systolic 882C, no hypoxia.  Hemoglobin 6.1 compared to hemoglobin 7.8 last week and 10.12 weeks ago.  BUN 50, creatinine 2.2 about her baseline.  Review of Systems: As per HPI otherwise 14 point review of systems negative.    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 Most likely scar. Mild subendocardial ischemia. EF 71% LOW RISK.    Cervical disc disease    fracture   Chronic kidney disease    COPD (chronic obstructive pulmonary disease) (HCC)    Diverticulosis    Hyperlipidemia    Hypertension    Hypothyroidism (acquired)    hypo   Myocardial infarction ([place]) 11/2014   TIA   Polymyalgia rheumatica ([place])    2011 Dr. Charlestine Night   Rheumatoid arthritis [facility]) 2011   Dr.Truslow; fracture knees, hands and wrists -    S/P CABG x 1 1996   CABG--LIMA-LAD for ostial LAD (not felt to be PCI amenable). EF NORMAL then; LIMA now atretic   Shortness of breath dyspnea    with exertion   Stroke ([place]) 11/2014   TIA    Urinary frequency     Past Surgical History:  Procedure Laterality Date   ABDOMINAL AORTIC ANEURYSM REPAIR  0034   Complicated by mesenteric artery stenosis and splenic artery infarction with acquired Asplenia   APPENDECTOMY     BUNIONECTOMY  07/2011  right foot   CARDIAC CATHETERIZATION  2005   (Most recent CATH) - ostial LAD lesion 20-30% (down from 90% initially). Atretic LIMA. Minimal disease the RCA and Circumflex system.   CARPAL TUNNEL RELEASE Left    CATARACT EXTRACTION Bilateral    CERVICAL SPINE SURGERY     plate 2008 Dr. Saintclair Halsted   COLONOSCOPY     CORONARY ARTERY BYPASS GRAFT  1996   INCLUDED AN INTERNAL MAMMARY ARTERY TO THE LAD. EF WAS NORMAL   INGUINAL HERNIA REPAIR Right    LAPAROSCOPIC APPENDECTOMY N/A 06/28/2016   Procedure: APPENDECTOMY LAPAROSCOPIC;  Surgeon: Leighton Ruff, MD;  Location: [facility]  ORS;  Service: General;  Laterality: N/A;   NM MYOVIEW LTD  06/2010   Fixed anteroseptal, apical and inferoapical defect with moderate size. Most likely scar. Mild subendocardial ischemia. EF 71% LOW RISK.    SPLENECTOMY     TOTAL HIP ARTHROPLASTY Left 01/23/2022   Procedure: LEFT TOTAL HIP ARTHROPLASTY ANTERIOR APPROACH;  Surgeon: Leandrew Koyanagi, MD;  Location: [place];  Service: Orthopedics;  Laterality: Left;  3-C   TRANSTHORACIC ECHOCARDIOGRAM  12/2014   [facility]: Normal LV size & function. EF 55-60%,    vagina polyp     VIDEO ASSISTED THORACOSCOPY (VATS)/WEDGE RESECTION Left 03/02/2015   Procedure: VIDEO ASSISTED THORACOSCOPY (VATS), MINI THORACOTOMY, LEFT UPPER LOBE WEDGE, TAKE DOWN OF INTERNAL MAMMARY LESIONS, PLACEMENT OF ON-Q PUMP;  Surgeon: Grace Isaac, MD;  Location: [place];  Service: Thoracic;  Laterality: Left;   VIDEO BRONCHOSCOPY N/A 03/02/2015   Procedure: BRONCHOSCOPY;  Surgeon: Grace Isaac, MD;  Location: [place];  Service: Thoracic;  Laterality: N/A;   VIDEO BRONCHOSCOPY WITH ENDOBRONCHIAL NAVIGATION N/A 10/08/2017   Procedure: VIDEO BRONCHOSCOPY WITH ENDOBRONCHIAL NAVIGATION WITH BIOPSIES OF LEFT UPPER LOBE AND LEFT LOWER LOBE;  Surgeon: Grace Isaac, MD;  Location: [place];  Service: Thoracic;  Laterality: N/A;     reports that she quit smoking about 63 years ago. Her smoking use included cigarettes. She has never been exposed to tobacco smoke. She has never used smokeless tobacco. She reports current alcohol use. She reports that she does not use drugs.  Allergies  Allergen Reactions   Nsaids Other (See Comments)    Stomach upset Told to avoid due to Eliquis   Aspirin Other (See Comments)    Stomach upset Told to avoid due to Eliquis   Codeine Nausea And Vomiting   Nitrostat [Nitroglycerin] Other (See Comments)    Bradycardia     Family History  Problem Relation Age of Onset   Hypertension Mother    Diabetes Mother    Heart disease Mother     Hyperlipidemia Mother    Stroke Father    Hyperlipidemia Sister    Hypertension Sister    Hypertension Daughter    Cancer Paternal Uncle        Deceased from cancer not sure of site   Hypertension Son    Hyperlipidemia Son    Hyperlipidemia Son    Hypertension Son    Hyperlipidemia Son    Hypertension Son    Hypertension Other    Coronary artery disease Other    Asthma Neg Hx    Colon cancer Neg Hx      Prior to Admission medications   Medication Sig Start Date End Date Taking? Authorizing Provider  acetaminophen (TYLENOL) 325 MG tablet Take 650 mg by mouth every 6 (six) hours as needed (generalized pain).   Yes [provider]  albuterol (VENTOLIN HFA) 108 (90 Base) MCG/ACT inhaler Inhale 1-2 puffs into the lungs every 4 (four) hours as needed for wheezing or shortness of breath.   Yes [provider]  amLODipine (NORVASC) 2.5 MG tablet Take 1 tablet (2.5 mg total) by mouth daily. 09/29/21  Yes Plotnikov, [name], MD  apixaban (ELIQUIS) 5 MG TABS tablet Take 5 mg by mouth 2 (two) times daily.   Yes [provider]  ascorbic acid (VITAMIN C) 500 MG tablet Take 500 mg by mouth daily.   Yes [provider]  atorvastatin (LIPITOR) 80 MG tablet Take 80 mg by mouth daily.   Yes [provider]  Cholecalciferol (VITAMIN D3) 50 MCG (2000 UT) capsule Take 1 capsule (2,000 Units total) by mouth daily. 11/13/18  Yes Plotnikov, [name], MD  Cyanocobalamin 2500 MCG TABS Take 2,500 mcg by mouth daily.   Yes [provider]  famotidine (PEPCID) 20 MG tablet Take 20 mg by mouth 2 (two) times daily.   Yes [provider]  gabapentin (NEURONTIN) 300 MG capsule Take 1 capsule (300 mg total) by mouth at bedtime. 02/11/22  Yes Mariel Aloe, MD  hydrochlorothiazide (HYDRODIURIL) 12.5 MG tablet TAKE 1 TABLET BY MOUTH EVERY DAY Patient taking differently: Take 12.5 mg by mouth daily. 04/08/21  Yes Tanda Rockers, MD  hydrocortisone  (ANUSOL-HC) 2.5 % rectal cream Place rectally 2 (two) times daily. Patient taking differently: Place 1 Application rectally 2 (two) times daily. 02/11/22  Yes Mariel Aloe, MD  leflunomide (ARAVA) 20 MG tablet Take 20 mg by mouth daily.    Yes [provider]  levothyroxine (SYNTHROID) 88 MCG tablet TAKE 1 TABLET (88 MCG TOTAL) BY MOUTH DAILY BEFORE BREAKFAST. 01/09/22  Yes Plotnikov, [name], MD  metoprolol tartrate (LOPRESSOR) 50 MG tablet Take 50 mg by mouth 2 (two) times daily.   Yes [provider]  Multiple Vitamins-Minerals (CENTRUM ADULT PO) Take 1 tablet by mouth daily. 1 Tablet Daily.   Yes [provider]  pantoprazole (PROTONIX) 40 MG tablet Take 40 mg by mouth daily.   Yes [provider]  sennosides-docusate sodium (SENOKOT-S) 8.6-50 MG tablet Take 2 tablets by mouth 2 (two) times daily.   Yes [provider]  tamsulosin (FLOMAX) 0.4 MG CAPS capsule Take 1 capsule (0.4 mg total) by mouth daily. 02/12/22  Yes Mariel Aloe, MD  Tiotropium Bromide-Olodaterol (STIOLTO RESPIMAT) 2.5-2.5 MCG/ACT AERS Inhale 2 each into the lungs at bedtime.   Yes [provider]  triamcinolone (NASACORT) 55 MCG/ACT AERO nasal inhaler Place 1 spray into the nose daily.   Yes [provider]  Golimumab 50 MG/0.5ML SOAJ Take 50 mg by mouth every 2 (two) months. Hold for the next 2 weeks per Orthopedic surgery recommendations. Patient not taking: Reported on 02/16/2022 02/11/22   Mariel Aloe, MD    Physical Exam: Vitals:   02/16/22 1151 02/16/22 1155 02/16/22 1400 02/16/22 1416  BP: (!) 160/61  137/63 (!) 139/59  Pulse: (!) 103  98 92  Resp: '18  16 16  '$ Temp: 98 F (36.7 C)  98 F (36.7 C) 98.1 F (36.7 C)  TempSrc: Oral  Oral Oral  SpO2: 99% 99% 97% 98%    Constitutional: NAD, calm, comfortable Vitals:   02/16/22 1151 02/16/22 1155 02/16/22 1400 02/16/22 1416  BP: (!) 160/61  137/63 (!) 139/59  Pulse: (!) 103  98 92  Resp:  '18  16 16  '$ Temp: 98 F (36.7 C)  98 F (36.7 C) 98.1 F (36.7 C)  TempSrc: Oral  Oral Oral  SpO2: 99% 99% 97% 98%   Eyes: PERRL, lids and conjunctivae normal ENMT: Mucous membranes are moist. Posterior pharynx clear of any exudate or lesions.Normal dentition.  Neck: normal, supple, no masses, no thyromegaly Respiratory: clear to auscultation bilaterally, no wheezing, no crackles. Normal respiratory effort. No accessory muscle use.  Cardiovascular: Regular rate and rhythm, no murmurs / rubs / gallops. No extremity edema. 2+ pedal pulses. No carotid bruits.  Abdomen: no tenderness, no masses palpated. No hepatosplenomegaly. Bowel sounds positive.  Musculoskeletal: no clubbing / cyanosis. No joint deformity upper and lower extremities. Good ROM, no contractures. Normal muscle tone.  Skin: no rashes, lesions, ulcers. No induration Neurologic: CN 2-12 grossly intact. Sensation intact, DTR normal. Strength 5/5 in all 4.  Psychiatric: Normal judgment and insight. Alert and oriented x 3. Normal mood.    Labs on Admission: I have personally reviewed following labs and imaging studies  CBC: Recent Labs  Lab 02/10/22 0143 02/16/22 1205  WBC 9.1 7.6  NEUTROABS  --  3.6  HGB 7.8* 6.1*  HCT 22.8* 18.1*  MCV 95.0 97.3  PLT 444* 696*   Basic Metabolic Panel: Recent Labs  Lab 02/16/22 1205  NA 137  K 4.8  CL 104  CO2 22  GLUCOSE 129*  BUN 43*  CREATININE 2.22*  CALCIUM 8.8*   GFR: Estimated Creatinine Clearance: 18.3 mL/min (A) (by C-G formula based on SCr of 2.22 mg/dL (H)). Liver Function Tests: Recent Labs  Lab 02/16/22 1205  AST 25  ALT 19  ALKPHOS 74  BILITOT 0.3  PROT 4.8*  ALBUMIN 2.3*   No results for input(s): "LIPASE", "AMYLASE" in the last 168 hours. No results for input(s): "AMMONIA" in the last 168 hours. Coagulation Profile: Recent Labs  Lab 02/16/22 1205  INR 1.3*   Cardiac Enzymes: No results for input(s): "CKTOTAL", "CKMB", "CKMBINDEX", "TROPONINI"  in the last 168 hours. BNP (last 3 results) No results for input(s): "PROBNP" in the last 8760 hours. HbA1C: No results for input(s): "HGBA1C" in the last 72 hours. CBG: No results for input(s): "GLUCAP" in the last 168 hours. Lipid Profile: No results for input(s): "CHOL", "HDL", "LDLCALC", "TRIG", "CHOLHDL", "LDLDIRECT" in the last 72 hours. Thyroid Function Tests: Recent Labs    02/16/22 1205  TSH 0.985   Anemia Panel: No results for input(s): "VITAMINB12", "FOLATE", "FERRITIN", "TIBC", "IRON", "RETICCTPCT" in the last 72 hours. Urine analysis:    Component Value Date/Time   COLORURINE AMBER (A) 02/06/2022 1800   APPEARANCEUR CLOUDY (A) 02/06/2022 1800   LABSPEC 1.014 02/06/2022 1800   PHURINE 5.0 02/06/2022 1800   GLUCOSEU NEGATIVE 02/06/2022 1800   GLUCOSEU NEGATIVE 11/14/2018 1508   HGBUR NEGATIVE 02/06/2022 1800   BILIRUBINUR NEGATIVE 02/06/2022 1800   KETONESUR NEGATIVE 02/06/2022 1800   PROTEINUR 100 (A) 02/06/2022 1800   UROBILINOGEN 0.2 11/14/2018 1508   NITRITE NEGATIVE 02/06/2022 1800   LEUKOCYTESUR MODERATE (A) 02/06/2022 1800    Radiological Exams on Admission: DG Chest Portable 1 View  Result Date: 02/16/2022 CLINICAL DATA:  GI bleeding EXAM: PORTABLE CHEST 1 VIEW COMPARISON:  Previous studies including the examination of 02/06/2022 FINDINGS: Transverse diameter of heart is slightly increased. There is previous coronary bypass surgery. There are no signs of pulmonary edema. There is blunting of lateral CP angles. Small linear densities are seen in right lower lung field. There is no pneumothorax. There is previous surgical fusion in cervical spine. IMPRESSION: Minimal  bilateral pleural effusions. Linear densities in right lower lung fields suggest subsegmental atelectasis. Electronically Signed   By: Elmer Picker M.D.   On: 02/16/2022 14:12    EKG: Pending  Assessment/Plan Principal Problem:   Lower GI bleed Active Problems:   Lower GI bleeding   (please populate well all problems here in Problem List. (For example, if patient is on BP meds at home and you resume or decide to hold them, it is a problem that needs to be her. Same for CAD, COPD, HLD and so on)  Acute on chronic normocytic anemia -Secondary to acute recurrent lower GI bleed, suspicious of recurrent diverticulosis bleeding vs internal hemorrhoid bleeding. -PRBC x2 -Last dose of Eliquis was yesterday evening, which is 1 dose 24 hours,, no indication for antidote -Monitor H&H to 12 hours, transfuse for hemodynamic instability -Given her chronic kidney function of CKD stage IV, poor candidate for IV contrast CT angiogram, unless life-threatening bleeding happens.  Explained to patient and her husband at bedside, both expressed understanding and agreed. -GI consultation appreciated. -Recheck iron study and reticulocyte count, may need EPO  Recently diagnosed PE -Reviewed CT angiogram result, appears to be scattered subsegmental right middle lobe PE with relatively low blood clot burden.  Eliquis on hold for now, depends on clinical course, will decide whether to put patient back on Eliquis versus other measures such as IVC filter. -No SCD given the diagnosis of acute DVT recently.  Chronic hypoalbuminemia -Appears to have a new proteinuria, check UA  HTN -Hold off home BP meds, start as needed hydralazine for now  CKD stage IV -Euvolemic, creatinine level at baseline, will discontinue hydrochlorothiazide given the low GFR.  Vulvovaginitis -Miconazole cream  RA -DMARDs on hold as per orthopedic surgery recommendation  CAD, status post CABG -No acute issue, continue Lipitor, hold off metoprolol  Chronic mesenteric artery stenosis -On sole Eliquis    DVT prophylaxis: None Code Status: Full code Family Communication: Husband at bedside Disposition Plan: Patient sick with lower GI bleed, requiring inpatient GI work-up and inpatient blood transfusion, expect more  than 2 midnight hospital stay. Consults called: GI Admission status: Telemetry admission   Lequita Halt MD [facility] Pager [phone]  02/16/2022, 2:29 PM

## 2022-02-16 NOTE — Assessment & Plan Note (Signed)
Intermittent elevated Sbp, takes Amlodipine, Metoprolol, HCTZ

## 2022-02-16 NOTE — Assessment & Plan Note (Signed)
takes Levothyroxine, TSH 2.35 06/21/21 

## 2022-02-16 NOTE — Progress Notes (Signed)
Pt arrived to room 5N03 from the ED. Received report from [place], Therapist, sports. See assessment. Will continue to monitor.

## 2022-02-16 NOTE — ED Triage Notes (Signed)
Pt here form Nursing home with c/o Gi bleed started on a blood thinner for a dvt after a hip replacement

## 2022-02-16 NOTE — Assessment & Plan Note (Signed)
Hospitalized 02/06/22-02/11/22 for acute PE, on Eliquis now, held Golimumab for 2 weeks until f/u with orthopedic surgeon

## 2022-02-16 NOTE — Assessment & Plan Note (Signed)
akes Golimumab(on hold for 2 weeks), Leflunomide, fx of knees, hands, wrists, cervical disc disease/fracture, takes Gabapentin,

## 2022-02-16 NOTE — Assessment & Plan Note (Addendum)
01/23/22 s/p total replacement of L hip, f/u Dr Erlinda Hong. Was on Lovenox for 28 days. Prn Percocet, Methocarbamol

## 2022-02-16 NOTE — ED Notes (Signed)
Critical hemoglobin 6.1.  MD Pearline Cables notified and aware.  No new orders at this time.

## 2022-02-16 NOTE — Assessment & Plan Note (Signed)
on Atorvastatin 

## 2022-02-16 NOTE — Assessment & Plan Note (Signed)
take Vit B12, Hgb 7.8 02/10/22<<8.1 02/14/22, Vit B12>2000, Iron 77 02/14/22

## 2022-02-16 NOTE — Progress Notes (Signed)
This encounter was created in error - please disregard.  This encounter was created in error - please disregard.

## 2022-02-16 NOTE — Assessment & Plan Note (Signed)
Bun/creat 46/2.09 02/14/22,  followed by Nephrology

## 2022-02-16 NOTE — Assessment & Plan Note (Signed)
Stable,  takes Famotidine, Pantoprazole 

## 2022-02-16 NOTE — Progress Notes (Signed)
Location:   SNF [place] Room Number: 107 Place of Service:  SNF (31) Provider: Lennie Odor Trevante Tennell NP  Plotnikov, [name], MD  Patient Care Team: Cassandria Anger, MD as PCP - General (Internal Medicine) Leonie Joshwa Hemric, MD as PCP - Cardiology (Cardiology) Leonie Khamari Yousuf, MD as Consulting Physician (Cardiology) Gavin Pound, MD as Consulting Physician (Rheumatology) Armbruster, Carlota Raspberry, MD as Consulting Physician (Gastroenterology) Tanda Rockers, MD as Consulting Physician (Pulmonary Disease) Grace Isaac, MD (Inactive) as Consulting Physician (Cardiothoracic Surgery) Aloha Gell, MD as Consulting Physician (Obstetrics and Gynecology) Alain Marion, [name], MD  Extended Emergency Contact Information Primary Emergency Contact: [name] Address: [address]           [place], [place] [address] [place] Phone: [phone] Mobile Phone: [phone] Relation: Spouse Secondary Emergency Contact: [name],[name] Address: [address] [place] [address] [place] Phone: [phone] Relation: Friend  Code Status: DNR Goals of care: Advanced Directive information    02/07/2022    8:13 AM  Advanced Directives  Does Patient Have a Medical Advance Directive? No     Chief Complaint  Patient presents with   Asthma   Acute Visit    Rectal bleed    HPI:  Pt is a 81 y.o. female seen today for an acute visit for rectal bleed, bright red blood seen in commode and on my rectal examination, external hemorrhoids present. Denied abd or rectal pain, not constipated.      Hospitalized 02/06/22-02/11/22 for acute PE, on Eliquis now, held Golimumab for 2 weeks until f/u with orthopedic surgeon             01/23/22 s/p total replacement of L hip, f/u Dr Erlinda Hong. Was on Lovenox for 28 days. Prn Percocet, Methocarbamol             DOE/COPD, prn Albuterol ACT, Stiolto, Triamcinolone nasal spray, O2 2lpm   BRBPR hemorrhoids, Hydrocortisone cream, avoid constipation             Urinary retention, voiding trial             DVT 01/28/22 [place] age indeterminate deep vein thrombosis  involving the right peroneal veins, on Eliquis             HTN, takes Amlodipine, Metoprolol, HCTZ             HLD takes Atorvastatin, LDL 74 09/07/20             Vit D deficiency, takes Vit D             Anemia, take Vit B12, Hgb 7.8 02/10/22<<8.1 02/14/22, Vit B12>2000, Iron 77 02/14/22             Constipation, takes Colace, MiraLax, Senokot S             GERD, takes Famotidine, Pantoprazole             RA, takes Golimumab(on hold for 2 weeks), Leflunomide, fx of knees, hands, wrists, cervical disc disease/fracture, takes Gabapentin,              Hypothyroidism, takes Levothyroxine, TSH 2.35 06/21/21             CAD s/p CABG x1, LIMA LAD for ostial LAD, EF wnl. 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 Most likely scar. Mild subendocardial ischemia. EF 71% LOW RISK.    Cervical disc disease    fracture   Chronic kidney disease    COPD (chronic obstructive pulmonary disease) (HCC)    Diverticulosis    Hyperlipidemia    Hypertension    Hypothyroidism  (acquired)    hypo   Myocardial infarction ([place]) 11/2014   TIA   Polymyalgia rheumatica ([place])    2011 Dr. Charlestine Night   Rheumatoid arthritis [facility]) 2011   Dr.Truslow; fracture knees, hands and wrists -    S/P CABG x 1 1996   CABG--LIMA-LAD for ostial LAD (not felt to be PCI amenable). 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 Atretic LIMA. Minimal disease the RCA and Circumflex system.   CARPAL TUNNEL RELEASE Left    CATARACT EXTRACTION Bilateral    CERVICAL SPINE SURGERY     plate 2008 Dr. Saintclair Halsted   COLONOSCOPY     CORONARY ARTERY BYPASS GRAFT  1996   INCLUDED AN INTERNAL MAMMARY ARTERY TO THE LAD. EF WAS NORMAL   INGUINAL HERNIA REPAIR Right    LAPAROSCOPIC APPENDECTOMY N/A 06/28/2016   Procedure: APPENDECTOMY LAPAROSCOPIC;  Surgeon: Leighton Ruff, MD;  Location: [facility];  Service: General;  Laterality: N/A;   NM MYOVIEW LTD  06/2010   Fixed anteroseptal, apical and inferoapical defect with moderate size. Most likely scar. Mild subendocardial ischemia. EF 71% LOW RISK.    SPLENECTOMY     TOTAL HIP ARTHROPLASTY Left 01/23/2022   Procedure: LEFT TOTAL HIP ARTHROPLASTY ANTERIOR APPROACH;  Surgeon: Leandrew Koyanagi, MD;  Location: [facility];  Service: Orthopedics;  Laterality: Left;  3-C   TRANSTHORACIC ECHOCARDIOGRAM  12/2014   [facility]: Normal LV size & function. EF 55-60%,    vagina polyp     VIDEO ASSISTED THORACOSCOPY (VATS)/WEDGE RESECTION Left 03/02/2015   Procedure: VIDEO ASSISTED THORACOSCOPY (VATS), MINI THORACOTOMY, LEFT UPPER LOBE  WEDGE, TAKE DOWN OF INTERNAL MAMMARY LESIONS, PLACEMENT OF ON-Q PUMP;  Surgeon: Grace Isaac, MD;  Location: [facility]  OR;  Service: Thoracic;  Laterality: Left;   VIDEO BRONCHOSCOPY N/A 03/02/2015   Procedure: BRONCHOSCOPY;  Surgeon: Grace Isaac, MD;  Location: [facility];  Service: Thoracic;  Laterality: N/A;   VIDEO BRONCHOSCOPY WITH ENDOBRONCHIAL NAVIGATION N/A 10/08/2017   Procedure: VIDEO BRONCHOSCOPY WITH ENDOBRONCHIAL NAVIGATION WITH BIOPSIES OF LEFT UPPER LOBE AND LEFT LOWER LOBE;  Surgeon: Grace Isaac, MD;  Location: [facility];  Service: Thoracic;  Laterality: N/A;    Allergies  Allergen Reactions   Nsaids Other (See Comments)    Pt states all NSAIDS does not sit well onstomach   Aspirin Other (See Comments)    Hurts stomach   Codeine Nausea And Vomiting   Nitroglycerin Other (See Comments)    Heart rate drops    Allergies as of 02/16/2022       Reactions   Nsaids Other (See Comments)   Pt states all NSAIDS does not sit well onstomach   Aspirin Other (See Comments)   Hurts stomach   Codeine Nausea And Vomiting   Nitroglycerin Other (See Comments)   Heart rate drops        Medication List        Accurate as of February 16, 2022 11:34 AM. If you have any questions, ask your nurse or doctor.          albuterol 108 (90 Base) MCG/ACT inhaler Commonly known as: VENTOLIN HFA Inhale 1-2 puffs into the lungs every 4 (four) hours as needed for wheezing or shortness of breath.   amLODipine 2.5 MG tablet Commonly known as: NORVASC Take 1 tablet (2.5 mg total) by mouth daily.   apixaban 5 MG Tabs tablet Commonly known as: ELIQUIS Take 2 tablets (10 mg total) by mouth 2 (two) times daily for 4 days, THEN 1 tablet (5 mg total) 2 (two) times daily. Start taking on: February 11, 2022   atorvastatin 80 MG tablet Commonly known as: LIPITOR Take 80 mg by mouth daily.   CENTRUM ADULT PO Take 1 tablet by mouth daily. 1 Tablet Daily.   Cyanocobalamin 2500 MCG  Tabs Take 2,500 mcg by mouth daily.   docusate sodium 100 MG capsule Commonly known as: COLACE Take 100 mg by mouth 2 (two) times daily.   famotidine 20 MG tablet Commonly known as: PEPCID Take 20 mg by mouth 2 (two) times daily.   gabapentin 300 MG capsule Commonly known as: NEURONTIN Take 1 capsule (300 mg total) by mouth at bedtime.   Golimumab 50 MG/0.5ML Soaj Take 50 mg by mouth every 2 (two) months. Hold for the next 2 weeks per Orthopedic surgery recommendations.   hydrochlorothiazide 12.5 MG tablet Commonly known as: HYDRODIURIL TAKE 1 TABLET BY MOUTH EVERY DAY   hydrocortisone 2.5 % rectal cream Commonly known as: ANUSOL-HC Place rectally 2 (two) times daily.   leflunomide 20 MG tablet Commonly known as: ARAVA Take 20 mg by mouth daily.   levothyroxine 88 MCG tablet Commonly known as: SYNTHROID TAKE 1 TABLET (88 MCG TOTAL) BY MOUTH DAILY BEFORE BREAKFAST.   metoprolol tartrate 50 MG tablet Commonly known as: LOPRESSOR Take 50 mg by mouth 2 (two) times daily.   pantoprazole 40 MG tablet Commonly known as: PROTONIX Take 40 mg by mouth daily.   Stiolto Respimat 2.5-2.5 MCG/ACT Aers Generic drug: Tiotropium Bromide-Olodaterol Inhale 2 each into the lungs at bedtime.   tamsulosin 0.4 MG Caps capsule Commonly known as: FLOMAX Take 1 capsule (0.4 mg total) by mouth daily.   triamcinolone 55 MCG/ACT Aero  nasal inhaler Commonly known as: NASACORT Place 1 spray into the nose daily.   Vitamin D3 50 MCG (2000 UT) capsule Take 1 capsule (2,000 Units total) by mouth daily.        Review of Systems  Constitutional:  Positive for activity change, appetite change and fatigue. Negative for fever.  HENT:  Negative for congestion and trouble swallowing.   Eyes:  Negative for visual disturbance.  Respiratory:  Positive for cough and shortness of breath. Negative for wheezing.        DOE  Cardiovascular:  Positive for leg swelling.  Gastrointestinal:  Positive  for anal bleeding and blood in stool. Negative for abdominal pain, constipation and vomiting.  Genitourinary:  Positive for difficulty urinating.       Foley  Musculoskeletal:  Positive for arthralgias, back pain, gait problem and joint swelling.  Skin:  Negative for color change.  Neurological:  Negative for tremors and headaches.  Psychiatric/Behavioral:  Negative for behavioral problems and sleep disturbance. The patient is not nervous/anxious.     Immunization History  Administered Date(s) Administered   Fluad Quad(high Dose 65+) 02/03/2021, 01/16/2022   Influenza Whole 05/02/2001, 01/27/2008, 01/12/2010, 12/31/2010, 01/25/2012   Influenza, High Dose Seasonal PF 01/14/2014, 02/13/2015, 12/21/2016, 01/09/2018, 12/29/2018   Influenza,inj,Quad PF,6+ Mos 12/23/2015   Influenza-Unspecified 02/03/2013, 01/13/2014, 02/13/2015, 01/20/2020   Meningococcal Polysaccharide 03/21/2012   Moderna SARS-COV2 Booster Vaccination 03/15/2020   Moderna Sars-Covid-2 Vaccination 06/02/2019, 06/30/2019   PPD Test 11/18/2013   Pfizer Covid-19 Vaccine Bivalent Booster 52yr & up 01/19/2021, 10/12/2021   Pneumococcal Conjugate-13 03/20/2013   Pneumococcal Polysaccharide-23 05/02/2000, 06/23/2010, 02/26/2018   Td 05/01/2006   Tdap 06/01/2016   Zoster Recombinat (Shingrix) 05/16/2017, 07/20/2017   Zoster, Live 01/17/2006   Pertinent  Health Maintenance Due  Topic Date Due   COLONOSCOPY (Pts 45-430yrInsurance coverage will need to be confirmed)  03/30/2019   INFLUENZA VACCINE  Completed   DEXA SCAN  Completed      02/08/2022    8:45 PM 02/09/2022   10:00 AM 02/10/2022    4:00 AM 02/10/2022    8:00 AM 02/10/2022    9:04 PM  Fall Risk  Patient Fall Risk Level Moderate fall risk High fall risk High fall risk High fall risk High fall risk   Functional Status Survey:    Vitals:   02/16/22 1104  BP: (!) 155/57  Pulse: (!) 104  Resp: 20  Temp: 98.3 F (36.8 C)  SpO2: 96%   There is no height  or weight on file to calculate BMI. Physical Exam Constitutional:      Comments: Exhausted easily.   HENT:     Head: Normocephalic and atraumatic.     Nose: Nose normal.     Mouth/Throat:     Mouth: Mucous membranes are moist.  Eyes:     Extraocular Movements: Extraocular movements intact.     Conjunctiva/sclera: Conjunctivae normal.     Pupils: Pupils are equal, round, and reactive to light.  Cardiovascular:     Rate and Rhythm: Normal rate and regular rhythm.     Heart sounds: No murmur heard. Pulmonary:     Effort: Pulmonary effort is normal.     Breath sounds: No wheezing or rhonchi.  Abdominal:     General: Bowel sounds are normal.     Palpations: Abdomen is soft.     Tenderness: There is no abdominal tenderness.     Hernia: No hernia is present.  Genitourinary:    Comments: External hemorrhoids,  bright red blood seen in commode and upon my rectal examination.  Musculoskeletal:        General: Tenderness present.     Cervical back: Normal range of motion and neck supple.     Right lower leg: Edema present.     Left lower leg: Edema present.     Comments: Moderate edema LLE, trace edema RLE  Skin:    General: Skin is warm and dry.     Coloration: Skin is pale.     Comments: L hip surgical incisions covered in dressing.   Neurological:     General: No focal deficit present.     Mental Status: She is alert and oriented to person, place, and time. Mental status is at baseline.     Motor: No weakness.     Gait: Gait abnormal.  Psychiatric:        Mood and Affect: Mood normal.        Behavior: Behavior normal.        Thought Content: Thought content normal.     Labs reviewed: Recent Labs    02/06/22 1529 02/07/22 0315 02/08/22 0606  NA 133* 134* 134*  K 4.3 3.7 3.8  CL 98 102 100  CO2 20* 19* 20*  GLUCOSE 122* 89 117*  BUN 33* 30* 35*  CREATININE 1.89* 1.90* 1.91*  CALCIUM 8.2* 7.9* 8.7*  MG 1.3*  --   --    Recent Labs    01/03/22 0952 02/06/22 1529  02/07/22 0315  AST 19 63* 48*  ALT 12 36 29  ALKPHOS 72 123 110  BILITOT 0.6 0.5 0.7  PROT 6.9 6.5 5.2*  ALBUMIN 3.8 2.4* 2.1*   Recent Labs    12/06/21 0948 01/03/22 0952 01/23/22 0644 02/08/22 0606 02/09/22 0037 02/10/22 0143  WBC 8.5 8.6   < > 11.1* 9.0 9.1  NEUTROABS 3.6 4.0  --   --   --   --   HGB 11.0* 12.2   < > 7.9* 7.5* 7.8*  HCT 32.6* 37.1   < > 22.7* 21.9* 22.8*  MCV 97.3 99.0   < > 93.4 94.4 95.0  PLT 314.0 349.0   < > 386 404* 444*   < > = values in this interval not displayed.   Lab Results  Component Value Date   TSH 2.35 06/21/2021   No results found for: "HGBA1C" Lab Results  Component Value Date   CHOL 157 09/07/2020   HDL 59.30 09/07/2020   LDLCALC 74 09/07/2020   LDLDIRECT 162.1 10/28/2009   TRIG 121.0 09/07/2020   CHOLHDL 3 09/07/2020    Significant Diagnostic Results in last 30 days:  VAS [place] LOWER EXTREMITY VENOUS (DVT)  Result Date: 02/07/2022  Lower Venous DVT Study Patient Name:  KONNI KESINGER  Date of Exam:   02/07/2022 Medical Rec #: [id number]       Accession #:    [id number] Date of Birth: 11/10/1940        Patient Gender: F Patient Age:   35 years Exam Location:  [facility] Procedure:      VAS [place] LOWER EXTREMITY VENOUS (DVT) Referring Phys: Sheppard Coil MELVIN --------------------------------------------------------------------------------  Indications: Edema.  Limitations: Pain tolerance. Comparison Study: 01/28/22 prior Performing Technologist: Archie Patten RVS  Examination Guidelines: A complete evaluation includes B-mode imaging, spectral Doppler, color Doppler, and power Doppler as needed of all accessible portions of each vessel. Bilateral testing is considered an integral part of a complete examination. Limited examinations for reoccurring  indications may be performed as noted. The reflux portion of the exam is performed with the patient in reverse Trendelenburg.   +---------+---------------+---------+-----------+----------+-------------------+ RIGHT    CompressibilityPhasicitySpontaneityPropertiesThrombus Aging      +---------+---------------+---------+-----------+----------+-------------------+ CFV      Full           Yes      Yes                                      +---------+---------------+---------+-----------+----------+-------------------+ SFJ      Full                                                             +---------+---------------+---------+-----------+----------+-------------------+ FV Prox  Full                                                             +---------+---------------+---------+-----------+----------+-------------------+ FV Mid                  Yes      Yes                  unable to tolerate                                                        compression due to                                                        pain tolerance-                                                           patent by color                                                           doppler             +---------+---------------+---------+-----------+----------+-------------------+ FV Distal               Yes      Yes                  unable to tolerate  compression due to                                                        pain tolerance-                                                           patent by color                                                           doppler             +---------+---------------+---------+-----------+----------+-------------------+ PFV      Full                                                             +---------+---------------+---------+-----------+----------+-------------------+ POP      Full           Yes      Yes                                       +---------+---------------+---------+-----------+----------+-------------------+ PTV      Full                                                             +---------+---------------+---------+-----------+----------+-------------------+ PERO     Full           Yes      Yes                  patent by color                                                           doppler             +---------+---------------+---------+-----------+----------+-------------------+   +---------+---------------+---------+-----------+----------+-------------------+ LEFT     CompressibilityPhasicitySpontaneityPropertiesThrombus Aging      +---------+---------------+---------+-----------+----------+-------------------+ CFV      Full           Yes      Yes                                      +---------+---------------+---------+-----------+----------+-------------------+ SFJ      Full                                                             +---------+---------------+---------+-----------+----------+-------------------+  FV Prox  Full                                                             +---------+---------------+---------+-----------+----------+-------------------+ FV Mid                  Yes      Yes                  unable to tolerate                                                        compression due to                                                        pain tolerance-                                                           patent by color                                                           doppler             +---------+---------------+---------+-----------+----------+-------------------+ FV Distal               Yes      Yes                  unable to tolerate                                                        compression due to                                                        pain tolerance-                                                            patent by color  doppler             +---------+---------------+---------+-----------+----------+-------------------+ PFV      Full                                                             +---------+---------------+---------+-----------+----------+-------------------+ POP      Full           Yes      Yes                                      +---------+---------------+---------+-----------+----------+-------------------+ PTV      Full                                                             +---------+---------------+---------+-----------+----------+-------------------+ PERO     Full                                                             +---------+---------------+---------+-----------+----------+-------------------+     Summary: RIGHT: - There is no evidence of deep vein thrombosis in the lower extremity.  - A cystic structure is found in the popliteal fossa.  LEFT: - There is no evidence of deep vein thrombosis in the lower extremity.  - No cystic structure found in the popliteal fossa.  *See table(s) above for measurements and observations. Electronically signed by Deitra Mayo MD on 02/07/2022 at 6:17:09 PM.    Final    ECHOCARDIOGRAM COMPLETE  Result Date: 02/07/2022    ECHOCARDIOGRAM REPORT   Patient Name:   RHEA KAELIN Date of Exam: 02/07/2022 Medical Rec #:  [id number]      Height:       63.0 in Accession #:    [id number]     Weight:       158.7 lb Date of Birth:  06/25/1940       BSA:          1.753 m Patient Age:    68 years       BP:           120/41 mmHg Patient Gender: F              HR:           79 bpm. Exam Location:  Inpatient Procedure: 2D Echo, 3D Echo, Cardiac Doppler and Color Doppler Indications:    Pulmonary Embolus I26.09  History:        Patient has prior history of Echocardiogram examinations, most                 recent  03/10/2019. CAD, Prior CABG, COPD and TIA; Risk                 Factors:Hypertension. History of AAA repair, GERD, GERD, Chronic  kidney disease. Mesenteric artery stenosis.  Sonographer:    Darlina Sicilian RDCS Referring Phys: [id number] [place]  1. Left ventricular ejection fraction, by estimation, is 65 to 70%. Left ventricular ejection fraction by 3D volume is 68 %. The left ventricle has normal function. The left ventricle has no regional wall motion abnormalities. There is severe asymmetric  left ventricular hypertrophy. Left ventricular diastolic parameters are consistent with Grade II diastolic dysfunction (pseudonormalization).  2. Right ventricular systolic function is normal. The right ventricular size is normal. There is normal pulmonary artery systolic pressure.  3. The mitral valve is normal in structure. Trivial mitral valve regurgitation. No evidence of mitral stenosis.  4. The aortic valve is tricuspid. There is mild thickening of the aortic valve. Aortic valve regurgitation is not visualized. Comparison(s): Prior images reviewed side by side. MR has improved. FINDINGS  Left Ventricle: Left ventricular ejection fraction, by estimation, is 65 to 70%. Left ventricular ejection fraction by 3D volume is 68 %. The left ventricle has normal function. The left ventricle has no regional wall motion abnormalities. The left ventricular internal cavity size was small. There is severe asymmetric left ventricular hypertrophy. Left ventricular diastolic parameters are consistent with Grade II diastolic dysfunction (pseudonormalization). Right Ventricle: The right ventricular size is normal. No increase in right ventricular wall thickness. Right ventricular systolic function is normal. There is normal pulmonary artery systolic pressure. The tricuspid regurgitant velocity is 2.16 m/s, and  with an assumed right atrial pressure of 3 mmHg, the estimated right ventricular systolic  pressure is 41.9 mmHg. Left Atrium: Left atrial size was normal in size. Right Atrium: Right atrial size was normal in size. Pericardium: There is no evidence of pericardial effusion. Presence of epicardial fat layer. Mitral Valve: The mitral valve is normal in structure. Trivial mitral valve regurgitation. No evidence of mitral valve stenosis. Tricuspid Valve: The tricuspid valve is normal in structure. Tricuspid valve regurgitation is mild . No evidence of tricuspid stenosis. Aortic Valve: The aortic valve is tricuspid. There is mild thickening of the aortic valve. Aortic valve regurgitation is not visualized. Pulmonic Valve: The pulmonic valve was normal in structure. Pulmonic valve regurgitation is mild. No evidence of pulmonic stenosis. Aorta: The aortic root and ascending aorta are structurally normal, with no evidence of dilitation. IAS/Shunts: No atrial level shunt detected by color flow Doppler.  LEFT VENTRICLE PLAX 2D LVIDd:         3.60 cm         Diastology LVIDs:         2.30 cm         LV e' medial:    4.05 cm/s LV PW:         1.10 cm         LV E/e' medial:  17.2 LV IVS:        1.50 cm         LV e' lateral:   4.87 cm/s LVOT diam:     2.40 cm         LV E/e' lateral: 14.3 LV SV:         55 LV SV Index:   31 LVOT Area:     4.52 cm        3D Volume EF                                LV 3D EF:    Left  ventricul                                             ar                                             ejection                                             fraction                                             by 3D                                             volume is                                             68 %.                                 3D Volume EF:                                3D EF:        68 %                                LV EDV:       96 ml                                LV ESV:       30 ml                                LV SV:        66 ml  RIGHT VENTRICLE RV Basal diam:  3.80 cm RV Mid diam:    2.00 cm RV S prime:     12.90 cm/s TAPSE (M-mode): 1.4 cm LEFT ATRIUM             Index        RIGHT ATRIUM           Index LA diam:        4.00 cm 2.28 cm/m   RA Area:     13.60 cm LA Vol (A2C):   55.7 ml 31.78 ml/m  RA Volume:   33.30 ml  19.00 ml/m LA Vol (A4C):   33.5 ml 19.11 ml/m LA Biplane Vol: 43.5 ml 24.82 ml/m  AORTIC  VALVE LVOT Vmax:   72.40 cm/s LVOT Vmean:  48.500 cm/s LVOT VTI:    0.122 m  AORTA Ao Root diam: 3.20 cm Ao Asc diam:  2.80 cm MITRAL VALVE               TRICUSPID VALVE MV Area (PHT): 3.43 cm    TR Peak grad:   18.7 mmHg MV Decel Time: 221 msec    TR Vmax:        216.00 cm/s MV E velocity: 69.80 cm/s MV A velocity: 69.40 cm/s  SHUNTS MV E/A ratio:  1.01        Systemic VTI:  0.12 m                            Systemic Diam: 2.40 cm Rudean Haskell MD Electronically signed by Rudean Haskell MD Signature Date/Time: 02/07/2022/1:27:00 PM    Final    CT Angio Chest PE W and/or Wo Contrast  Result Date: 02/06/2022 CLINICAL DATA:  Provided history: Pulmonary embolism (PE) suspected, high prob reduce dose contrast Dural venous and constipation. Recent hip replacement. EXAM: CT ANGIOGRAPHY CHEST WITH CONTRAST TECHNIQUE: Multidetector CT imaging of the chest was performed using the standard protocol during bolus administration of intravenous contrast. Multiplanar CT image reconstructions and MIPs were obtained to evaluate the vascular anatomy. RADIATION DOSE REDUCTION: This exam was performed according to the departmental dose-optimization program which includes automated exposure control, adjustment of the mA and/or kV according to patient size and/or use of iterative reconstruction technique. CONTRAST:  80 cc Omnipaque 350 IV COMPARISON:  Radiograph earlier today. Chest CT 03/17/2021. FINDINGS: Cardiovascular: Isolated subsegmental embolus in the medial right middle lobe pulmonary artery, for example series 9, image 171  and series 10, image 58. No additional pulmonary emboli. Calcified and noncalcified atheromatous plaque throughout the thoracic aorta. No aneurysm or dissection. Post CABG. Upper normal heart size. No pericardial effusion. Mediastinum/Nodes: No enlarged mediastinal or hilar lymph nodes. No visible thyroid nodule. Tiny hiatal hernia. Lungs/Pleura: Small right and trace left pleural effusion. Associated atelectasis. The previous subpleural right lower lobe nodule is obscured on the current exam due to pleural fluid and atelectasis. Postsurgical change in the left upper lobe. There is mild bronchial thickening in the lower lobes. Upper Abdomen: No acute upper abdominal findings. Elongated left lobe of the liver. Musculoskeletal: Stable punctate sclerotic focus within T4 vertebral body likely a bone island. Median sternotomy. No acute osseous findings. Review of the MIP images confirms the above findings. IMPRESSION: 1. Isolated subsegmental embolus in the medial right middle lobe pulmonary artery. 2. Small right and trace left pleural effusion. Associated atelectasis. 3. Mild bronchial thickening in the lower lobes. Aortic Atherosclerosis (ICD10-I70.0). Critical Value/emergent results were called by telephone at the time of interpretation on 02/06/2022 at 10:25 pm to provider DAVID YAO , who verbally acknowledged these results. Electronically Signed   By: Keith Rake M.D.   On: 02/06/2022 22:26   CT Head Wo Contrast  Result Date: 02/06/2022 CLINICAL DATA:  Mental status change, unknown cause EXAM: CT HEAD WITHOUT CONTRAST TECHNIQUE: Contiguous axial images were obtained from the base of the skull through the vertex without intravenous contrast. RADIATION DOSE REDUCTION: This exam was performed according to the departmental dose-optimization program which includes automated exposure control, adjustment of the mA and/or kV according to patient size and/or use of iterative reconstruction technique. COMPARISON:   02/15/2020 FINDINGS: Brain: No evidence of acute infarction, hemorrhage, mass, mass  effect, or midline shift. No hydrocephalus or extra-axial fluid collection. Mildly advanced cerebral atrophy for age. Vascular: No hyperdense vessel. Skull: Normal. Negative for fracture or focal lesion. Sinuses/Orbits: No acute finding. Status post bilateral lens replacements. Other: The mastoid air cells are well aerated. IMPRESSION: No acute intracranial process. Electronically Signed   By: Merilyn Baba M.D.   On: 02/06/2022 14:50   DG Chest 2 View  Result Date: 02/06/2022 CLINICAL DATA:  Cough EXAM: CHEST - 2 VIEW COMPARISON:  Chest x-ray dated Sep 26, 2019 FINDINGS: Cardiac and mediastinal contours are within normal limits. Prior median sternotomy and CABG. Small bilateral pleural effusions and bibasilar atelectasis. No evidence of pneumothorax. IMPRESSION: Small bilateral pleural effusions and bibasilar atelectasis. Electronically Signed   By: Yetta Glassman M.D.   On: 02/06/2022 14:49   VAS [place] LOWER EXTREMITY VENOUS (DVT)  Result Date: 01/28/2022  Lower Venous DVT Study Patient Name:  TATIA PETRUCCI  Date of Exam:   01/28/2022 Medical Rec #: [id number]       Accession #:    [id number] Date of Birth: Aug 25, 1940        Patient Gender: F Patient Age:   24 years Exam Location:  [facility] [place] Procedure:      VAS [place] LOWER EXTREMITY VENOUS (DVT) Referring Phys: Gloriann Loan --------------------------------------------------------------------------------  Indications: Edema, and Swelling.  Comparison Study: no prior Performing Technologist: Archie Patten RVS  Examination Guidelines: A complete evaluation includes B-mode imaging, spectral Doppler, color Doppler, and power Doppler as needed of all accessible portions of each vessel. Bilateral testing is considered an integral part of a complete examination. Limited examinations for reoccurring indications may be performed as noted. The reflux portion of the exam is  performed with the patient in reverse Trendelenburg.  +--------+---------------+---------+-----------+----------+--------------------+ RIGHT   CompressibilityPhasicitySpontaneityPropertiesThrombus Aging       +--------+---------------+---------+-----------+----------+--------------------+ CFV     Full           Yes      Yes                                       +--------+---------------+---------+-----------+----------+--------------------+ SFJ     Full                                                              +--------+---------------+---------+-----------+----------+--------------------+ FV Prox Full                                                              +--------+---------------+---------+-----------+----------+--------------------+ FV Mid  Full                                                              +--------+---------------+---------+-----------+----------+--------------------+ FV      Full  Distal                                                                    +--------+---------------+---------+-----------+----------+--------------------+ PFV     Full                                                              +--------+---------------+---------+-----------+----------+--------------------+ POP     Full           Yes      Yes                                       +--------+---------------+---------+-----------+----------+--------------------+ PTV     Full                                                              +--------+---------------+---------+-----------+----------+--------------------+ PERO    None                                         in a single paired                                                        vein                 +--------+---------------+---------+-----------+----------+--------------------+    +---------+---------------+---------+-----------+----------+--------------+ LEFT     CompressibilityPhasicitySpontaneityPropertiesThrombus Aging +---------+---------------+---------+-----------+----------+--------------+ CFV      Full           Yes      Yes                                 +---------+---------------+---------+-----------+----------+--------------+ SFJ      Full                                                        +---------+---------------+---------+-----------+----------+--------------+ FV Prox  Full                                                        +---------+---------------+---------+-----------+----------+--------------+ FV Mid   Full                                                        +---------+---------------+---------+-----------+----------+--------------+  FV DistalFull                                                        +---------+---------------+---------+-----------+----------+--------------+ PFV      Full                                                        +---------+---------------+---------+-----------+----------+--------------+ POP      Full           Yes      Yes                                 +---------+---------------+---------+-----------+----------+--------------+ PTV      Full                                                        +---------+---------------+---------+-----------+----------+--------------+ PERO     Full                                                        +---------+---------------+---------+-----------+----------+--------------+     Summary: RIGHT: - 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 - A cystic structure is found in the popliteal fossa.  LEFT: - There is no evidence of deep vein thrombosis in the lower extremity.  - No cystic structure found in the popliteal fossa.  *See table(s) above for measurements and  observations. Electronically signed by Deitra Mayo MD on 01/28/2022 at 4:00:42 PM.    Final    DG Pelvis Portable  Result Date: 01/23/2022 CLINICAL DATA:  Postop left hip arthroplasty. EXAM: PORTABLE PELVIS 1-2 VIEWS COMPARISON:  Left hip radiographs 10/06/2021 FINDINGS: Sequelae of interval left total hip arthroplasty are identified. Gas is noted in the adjacent soft tissues. No acute fracture or dislocation is evident on this single image. There is mild superior hip joint space narrowing on the right. IMPRESSION: Left total hip arthroplasty without evidence of acute osseous abnormality. Electronically Signed   By: Logan Bores M.D.   On: 01/23/2022 10:43   DG HIP UNILAT WITH PELVIS 1V LEFT  Result Date: 01/23/2022 CLINICAL DATA:  Fluoroscopy provided during hip replacement. Fluoroscopy time: 24 seconds. Cumulative air kerma: 2.49 mGy EXAM: DG HIP (WITH OR WITHOUT PELVIS) 1V*L* COMPARISON:  None Available. FINDINGS: Four images were obtained during left hip replacement. By the end of the study, the left hip is been replaced in hardware is in good position on frontal imaging. IMPRESSION: Fluoroscopy provided during left hip replacement as above. Electronically Signed   By: Dorise Bullion III M.D.   On: 01/23/2022 09:07   DG C-Arm 1-60 Min-No Report  Result Date: 01/23/2022 Fluoroscopy was utilized by the requesting physician.  No radiographic interpretation.   DG C-Arm 1-60 Min-No Report  Result Date: 01/23/2022 Fluoroscopy was utilized by the requesting physician.  No radiographic interpretation.    Assessment/Plan: BRBPR (bright red blood per rectum) hemorrhoids, Hydrocortisone cream, avoid constipation, rectal bleed, bright red blood seen in commode and on my rectal examination, external hemorrhoids present. Denied abd or rectal pain, not constipated. ED for further evaluation and treatment.   Acute pulmonary embolism ([place]) Hospitalized 02/06/22-02/11/22 for acute PE, on Eliquis now,  held Golimumab for 2 weeks until f/u with orthopedic surgeon  Left hip pain 01/23/22 s/p total replacement of L hip, f/u Dr Erlinda Hong. Was on Lovenox for 28 days. Prn Percocet, Methocarbamol  COPD GOLD I prn Albuterol ACT, Stiolto, Triamcinolone nasal spray, O2 2lpm  DVT (deep venous thrombosis) (HCC) DVT 01/28/22 [place] age indeterminate deep vein thrombosis  involving the right peroneal veins, on Eliquis  Essential hypertension Intermittent elevated Sbp, takes Amlodipine, Metoprolol, HCTZ  Dyslipidemia, goal LDL below 70  takes Atorvastatin, LDL 74 09/07/20  Vitamin D deficiency takes Vit D  Acute blood loss anemia (ABLA)  take Vit B12, Hgb 7.8 02/10/22<<8.1 02/14/22, Vit B12>2000, Iron 77 02/14/22  Slow transit constipation takes Colace, MiraLax, Senokot S  GERD (gastroesophageal reflux disease) Stable,  takes Famotidine, Pantoprazole  Rheumatoid arthritis (HCC) akes Golimumab(on hold for 2 weeks), Leflunomide, fx of knees, hands, wrists, cervical disc disease/fracture, takes Gabapentin,   Hypothyroidism  takes Levothyroxine, TSH 2.35 06/21/21  Aortic atherosclerosis (HCC) s/p CABG x1, LIMA LAD for ostial LAD, EF wnl. S/p aneurysm repair  CKD (chronic kidney disease) stage 4, GFR 15-29 ml/min (HCC) Bun/creat 46/2.09 02/14/22,  followed by Nephrology  TIA (transient ischemic attack) on Atorvastatin    Family/ staff Communication: plan of care reviewed with the patient, the patient's husband, and charge nurse.   Labs/tests ordered:  none  Time spend 35 minutes.

## 2022-02-16 NOTE — Plan of Care (Signed)
  Problem: Pain Managment: Goal: General experience of comfort will improve Outcome: Progressing   Problem: Safety: Goal: Ability to remain free from injury will improve Outcome: Progressing   Problem: Skin Integrity: Goal: Risk for impaired skin integrity will decrease Outcome: Progressing   

## 2022-02-16 NOTE — Assessment & Plan Note (Signed)
takes Colace, MiraLax, Senokot S

## 2022-02-16 NOTE — Assessment & Plan Note (Signed)
prn Albuterol ACT, Stiolto, Triamcinolone nasal spray, O2 2lpm 

## 2022-02-16 NOTE — Assessment & Plan Note (Signed)
hemorrhoids, Hydrocortisone cream, avoid constipation, rectal bleed, bright red blood seen in commode and on my rectal examination, external hemorrhoids present. Denied abd or rectal pain, not constipated. ED for further evaluation and treatment.

## 2022-02-16 NOTE — Assessment & Plan Note (Signed)
takes Atorvastatin, LDL 74 09/07/20 

## 2022-02-17 DIAGNOSIS — K922 Gastrointestinal hemorrhage, unspecified: Secondary | ICD-10-CM | POA: Diagnosis not present

## 2022-02-17 LAB — TYPE AND SCREEN
ABO/RH(D): O POS
Antibody Screen: NEGATIVE
Unit division: 0
Unit division: 0

## 2022-02-17 LAB — BASIC METABOLIC PANEL
Anion gap: 11 (ref 5–15)
BUN: 35 mg/dL — ABNORMAL HIGH (ref 8–23)
CO2: 21 mmol/L — ABNORMAL LOW (ref 22–32)
Calcium: 9.1 mg/dL (ref 8.9–10.3)
Chloride: 104 mmol/L (ref 98–111)
Creatinine, Ser: 2.03 mg/dL — ABNORMAL HIGH (ref 0.44–1.00)
GFR, Estimated: 24 mL/min — ABNORMAL LOW (ref >60)
Glucose, Bld: 120 mg/dL — ABNORMAL HIGH (ref 70–99)
Potassium: 4 mmol/L (ref 3.5–5.1)
Sodium: 136 mmol/L (ref 135–145)

## 2022-02-17 LAB — BPAM RBC
Blood Product Expiration Date: 202311152359
Blood Product Expiration Date: 202311182359
ISSUE DATE / TIME: 202310191350
ISSUE DATE / TIME: 202310192248
Unit Type and Rh: 5100
Unit Type and Rh: 5100

## 2022-02-17 LAB — CBC
HCT: 28.7 % — ABNORMAL LOW (ref 36.0–46.0)
Hemoglobin: 9.6 g/dL — ABNORMAL LOW (ref 12.0–15.0)
MCH: 30.8 pg (ref 26.0–34.0)
MCHC: 33.4 g/dL (ref 30.0–36.0)
MCV: 92 fL (ref 80.0–100.0)
Platelets: 431 10*3/uL — ABNORMAL HIGH (ref 150–400)
RBC: 3.12 MIL/uL — ABNORMAL LOW (ref 3.87–5.11)
RDW: 16.4 % — ABNORMAL HIGH (ref 11.5–15.5)
WBC: 10.2 10*3/uL (ref 4.0–10.5)
nRBC: 1.6 % — ABNORMAL HIGH (ref 0.0–0.2)

## 2022-02-17 MED ORDER — METOPROLOL TARTRATE 25 MG PO TABS
25.0000 mg | ORAL_TABLET | Freq: Two times a day (BID) | ORAL | Status: DC
  Administered 2022-02-17 – 2022-02-22 (×10): 25 mg via ORAL
  Filled 2022-02-17 (×11): qty 1

## 2022-02-17 NOTE — Progress Notes (Signed)
Pt called out for assistance to the bathroom.  A large amount of blood with a small pea sized stool was noted by Probation officer.   Pt denies any pain,dizziness or nausea.

## 2022-02-17 NOTE — Progress Notes (Signed)
PROGRESS NOTE    [name]  MVH:[id number] DOB: Jul 16, 1940 DOA: 02/16/2022 PCP: Cassandria Anger, MD    Brief Narrative:  [name] is a 81 y.o. 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   Patient started noticed episode of blood in the stool about 6-7 days ago, intermittent, no abdominal pain denies any chest pain lightheadedness shortness of breath.  Increasedly, she noticed persistent rectal bleed, this morning, she had a large episode of rectal bleed with bright red blood, again denies any lightheadedness chest pain or shortness of breath.  No abdominal pain.  Her last dose of Eliquis was yesterday evening.  She called her PCP who directed her to ED. patient reported similar episode in July, when she was hospitalized and bleeding scan negative for active bleed, but patient was on Plavix at that time but not on Eliquis.   Assessment and Plan: Acute on chronic normocytic anemia -Secondary to acute recurrent lower GI bleed, suspicious of recurrent diverticulosis bleeding vs internal hemorrhoid bleeding. -PRBC x2 -Last dose of Eliquis was 10/18 -Given her chronic kidney function of CKD stage IV, poor candidate for IV contrast CT angiogram, unless life-threatening bleeding happens -GI consultation appreciated.    Recently diagnosed PE -Reviewed CT angiogram result, appears to be scattered subsegmental right middle lobe PE with relatively low blood clot burden.  Eliquis on hold for now, depends on clinical course, will decide whether to put patient back on Eliquis  -last U/S did not show DVT   HTN -resume meds as able  CKD stage IV -Euvolemic, creatinine level at  baseline, will discontinue hydrochlorothiazide given the low GFR.   Vulvovaginitis -Miconazole cream   RA -DMARDs on hold   CAD, status post CABG -No acute issue, continue Lipitor -resume BB   Chronic mesenteric artery stenosis -outpatient follow up     DVT prophylaxis:     Code Status: Full Code Family Communication:   Disposition Plan:  Level of care: Telemetry Medical Status is: Inpatient Remains inpatient appropriate because: monitor for further bleeding    Consultants:  GI   Subjective: Had bleeding this AM  Objective: Vitals:   02/16/22 2253 02/16/22 2325 02/17/22 0145 02/17/22 0856  BP: (!) 158/55 (!) 146/60 (!) 144/65 (!) 136/43  Pulse: (!) 106 100 95 (!) 104  Resp: (!) '22 19  16  '$ Temp: 97.7 F (36.5 C) 97.9 F (36.6 C) 97.7 F (36.5 C) 97.8 F (36.6 C)  TempSrc: Axillary Oral Oral Oral  SpO2: 100%  99% 99%    Intake/Output Summary (Last 24 hours) at 02/17/2022 1039 Last data filed at 02/17/2022 0131 Gross per 24 hour  Intake 315 ml  Output --  Net 315 ml   There were no vitals filed for this visit.  Examination:   General: Appearance:     Overweight female in no acute distress     Lungs:     respirations unlabored  Heart:    Tachycardic.    MS:   All extremities are intact.    Neurologic:   Awake, alert       Data Reviewed: I have personally reviewed following labs and imaging studies  CBC: Recent Labs  Lab 02/16/22 1205  02/16/22 1706 02/16/22 2005 02/17/22 0326  WBC 7.6  --   --  10.2  NEUTROABS 3.6  --   --   --   HGB 6.1* 8.0* 7.7* 9.6*  HCT 18.1* 22.8* 22.6* 28.7*  MCV 97.3  --   --  92.0  PLT 460*  --   --  841*   Basic Metabolic Panel: Recent Labs  Lab 02/16/22 1205 02/17/22 0326  NA 137 136  K 4.8 4.0  CL 104 104  CO2 22 21*  GLUCOSE 129* 120*  BUN 43* 35*  CREATININE 2.22* 2.03*  CALCIUM 8.8* 9.1   GFR: Estimated Creatinine Clearance: 20 mL/min (A) (by C-G formula based on SCr of 2.03 mg/dL  (H)). Liver Function Tests: Recent Labs  Lab 02/16/22 1205  AST 25  ALT 19  ALKPHOS 74  BILITOT 0.3  PROT 4.8*  ALBUMIN 2.3*   No results for input(s): "LIPASE", "AMYLASE" in the last 168 hours. No results for input(s): "AMMONIA" in the last 168 hours. Coagulation Profile: Recent Labs  Lab 02/16/22 1205  INR 1.3*   Cardiac Enzymes: No results for input(s): "CKTOTAL", "CKMB", "CKMBINDEX", "TROPONINI" in the last 168 hours. BNP (last 3 results) No results for input(s): "PROBNP" in the last 8760 hours. HbA1C: No results for input(s): "HGBA1C" in the last 72 hours. CBG: No results for input(s): "GLUCAP" in the last 168 hours. Lipid Profile: No results for input(s): "CHOL", "HDL", "LDLCALC", "TRIG", "CHOLHDL", "LDLDIRECT" in the last 72 hours. Thyroid Function Tests: Recent Labs    02/16/22 1205  TSH 0.985   Anemia Panel: Recent Labs    02/16/22 1706  FERRITIN 128  TIBC 266  IRON 104  RETICCTPCT 3.3*   Sepsis Labs: No results for input(s): "PROCALCITON", "LATICACIDVEN" in the last 168 hours.  No results found for this or any previous visit (from the past 240 hour(s)).       Radiology Studies: DG Chest Portable 1 View  Result Date: 02/16/2022 CLINICAL DATA:  GI bleeding EXAM: PORTABLE CHEST 1 VIEW COMPARISON:  Previous studies including the examination of 02/06/2022 FINDINGS: Transverse diameter of heart is slightly increased. There is previous coronary bypass surgery. There are no signs of pulmonary edema. There is blunting of lateral CP angles. Small linear densities are seen in right lower lung field. There is no pneumothorax. There is previous surgical fusion in cervical spine. IMPRESSION: Minimal bilateral pleural effusions. Linear densities in right lower lung fields suggest subsegmental atelectasis. Electronically Signed   By: Elmer Picker M.D.   On: 02/16/2022 14:12        Scheduled Meds:  arformoterol  15 mcg Nebulization BID   And    umeclidinium bromide  1 puff Inhalation Daily   atorvastatin  80 mg Oral Daily   clotrimazole  1 Applicatorful Vaginal QHS   cyanocobalamin  1,000 mcg Oral Daily   famotidine  20 mg Oral Daily   gabapentin  300 mg Oral QHS   hydrocortisone   Rectal BID   levothyroxine  88 mcg Oral QAC breakfast   pantoprazole  40 mg Oral Daily   senna-docusate  2 tablet Oral BID   tamsulosin  0.4 mg Oral Daily   triamcinolone  1 spray Nasal Daily   Continuous Infusions:   LOS: 1 day    Time spent: 45 minutes spent on chart review, discussion with nursing staff, consultants, updating family and interview/physical exam; more than 50% of that time was spent in counseling and/or coordination of care.  Geradine Girt, DO [facility] Available via Epic secure chat 7am-7pm After these hours, please refer to coverage provider listed on [email or web address] 02/17/2022, 10:39 AM

## 2022-02-17 NOTE — Progress Notes (Addendum)
[facility] Progress Note  CC:  LGIB  Subjective:  Still having some bleeding per her nurse, but was just small volume once this AM.  Patient has no additional GI complaints.  Objective:  Vital signs in last 24 hours: Temp:  [97.4 F (36.3 C)-98.3 F (36.8 C)] 97.8 F (36.6 C) (10/20 0856) Pulse Rate:  [92-109] 104 (10/20 0856) Resp:  [14-25] 16 (10/20 0856) BP: (131-192)/(43-108) 136/43 (10/20 0856) SpO2:  [94 %-100 %] 99 % (10/20 0856) Last BM Date : 02/17/22 General:  Alert, Well-developed, in NAD Heart:  Slightly tachy; no murmurs Pulm:  CTAB. No W/R/R. Abdomen:  Soft, non-distended.  BS present.  Non-tender.  Umbilical hernia noted. Extremities:  Without edema. Neurologic:  Alert and oriented x 4;  grossly normal neurologically. Psych:  Alert and cooperative. Normal mood and affect.  Intake/Output from previous day: 10/19 0701 - 10/20 0700 In: 315 [Blood:315] Out: -   Lab Results: Recent Labs    02/16/22 1205 02/16/22 1706 02/16/22 2005 02/17/22 0326  WBC 7.6  --   --  10.2  HGB 6.1* 8.0* 7.7* 9.6*  HCT 18.1* 22.8* 22.6* 28.7*  PLT 460*  --   --  431*   BMET Recent Labs    02/16/22 1205 02/17/22 0326  NA 137 136  K 4.8 4.0  CL 104 104  CO2 22 21*  GLUCOSE 129* 120*  BUN 43* 35*  CREATININE 2.22* 2.03*  CALCIUM 8.8* 9.1   LFT Recent Labs    02/16/22 1205  PROT 4.8*  ALBUMIN 2.3*  AST 25  ALT 19  ALKPHOS 74  BILITOT 0.3   PT/INR Recent Labs    02/16/22 1205  LABPROT 16.3*  INR 1.3*   DG Chest Portable 1 View  Result Date: 02/16/2022 CLINICAL DATA:  GI bleeding EXAM: PORTABLE CHEST 1 VIEW COMPARISON:  Previous studies including the examination of 02/06/2022 FINDINGS: Transverse diameter of heart is slightly increased. There is previous coronary bypass surgery. There are no signs of pulmonary edema. There is blunting of lateral CP angles. Small linear densities are seen in right lower lung field. There is no  pneumothorax. There is previous surgical fusion in cervical spine. IMPRESSION: Minimal bilateral pleural effusions. Linear densities in right lower lung fields suggest subsegmental atelectasis. Electronically Signed   By: Elmer Picker M.D.   On: 02/16/2022 14:12    Assessment / Plan: *LGIB: Suspect diverticular bleed.  Had similar presentation in July 2023.  Nuc med bleeding scan at that time was negative and bleeding resolved.  Now this is in the setting of Eliquis with recent PE. *Recent PE on Eliquis: PE diagnosed on 02/06/2022 after having hip surgery 9/25.  Last dose 10/18 PM. *Acute blood loss anemia: Hemoglobin 6.1 g down from 7.8 g 6 days ago and 9.6 g 10 days ago.  Received 2 units packed red blood cells.  Hgb stable at 9.6 grams this AM.  -She just had a CTA of the chest on 10/9 to diagnosed her PE, but her renal function is poor.  Nuc med bleeding scan was ordered in July for her GI bleeding because of this.  If bleeding continues then need to repeat nuc med bleeding scan.  At this time it sounds like bleeding has slowed some and I do not think that these studies would be positive.  Continue to observe for now. -Monitor hemoglobin and transfuse further if needed. -Continue clear liquids. -Continue to hold Eliquis for now.  LOS: 1 day   Laban Emperor. Zehr  02/17/2022, 10:37 AM    Attending Physician Note   I have taken an interval history, reviewed the chart and examined the patient. I performed a substantive portion of this encounter, including complete performance of at least one of the key components, in conjunction with the APP. I agree with the APP's note, impression and recommendations with my edits. My additional impressions and recommendations are as follows.   *Acute painless LGI bleed, suspect diverticular  *PE on Eliquis, diagnosed earlier this month  *ABL anemia    *Continue supportive care with transfusions as indicated, IVF, bedrest *Continue to hold  Eliquis *Continue clears for now *If significant bleeding recurs CTA abd/pelvis vs nuc med bleeding scan, favor the latter given CKD4   Lucio Edward, MD [facility] See AMION, [facility], for our on call provider

## 2022-02-18 DIAGNOSIS — D62 Acute posthemorrhagic anemia: Secondary | ICD-10-CM | POA: Diagnosis not present

## 2022-02-18 DIAGNOSIS — K922 Gastrointestinal hemorrhage, unspecified: Secondary | ICD-10-CM | POA: Diagnosis not present

## 2022-02-18 DIAGNOSIS — Z7901 Long term (current) use of anticoagulants: Secondary | ICD-10-CM | POA: Diagnosis not present

## 2022-02-18 LAB — CBC
HCT: 22.3 % — ABNORMAL LOW (ref 36.0–46.0)
Hemoglobin: 7.9 g/dL — ABNORMAL LOW (ref 12.0–15.0)
MCH: 32 pg (ref 26.0–34.0)
MCHC: 35.4 g/dL (ref 30.0–36.0)
MCV: 90.3 fL (ref 80.0–100.0)
Platelets: 362 10*3/uL (ref 150–400)
RBC: 2.47 MIL/uL — ABNORMAL LOW (ref 3.87–5.11)
RDW: 16.5 % — ABNORMAL HIGH (ref 11.5–15.5)
WBC: 9.9 10*3/uL (ref 4.0–10.5)
nRBC: 1.8 % — ABNORMAL HIGH (ref 0.0–0.2)

## 2022-02-18 LAB — BASIC METABOLIC PANEL
Anion gap: 9 (ref 5–15)
BUN: 31 mg/dL — ABNORMAL HIGH (ref 8–23)
CO2: 21 mmol/L — ABNORMAL LOW (ref 22–32)
Calcium: 9 mg/dL (ref 8.9–10.3)
Chloride: 105 mmol/L (ref 98–111)
Creatinine, Ser: 1.79 mg/dL — ABNORMAL HIGH (ref 0.44–1.00)
GFR, Estimated: 28 mL/min — ABNORMAL LOW (ref >60)
Glucose, Bld: 104 mg/dL — ABNORMAL HIGH (ref 70–99)
Potassium: 3.6 mmol/L (ref 3.5–5.1)
Sodium: 135 mmol/L (ref 135–145)

## 2022-02-18 LAB — HEMOGLOBIN AND HEMATOCRIT, BLOOD
HCT: 26.4 % — ABNORMAL LOW (ref 36.0–46.0)
Hemoglobin: 8.7 g/dL — ABNORMAL LOW (ref 12.0–15.0)

## 2022-02-18 MED ORDER — BARRIER CREAM NON-SPECIFIED: 1.0000 | TOPICAL_CREAM | Freq: Two times a day (BID) | TOPICAL | Status: DC | PRN

## 2022-02-18 NOTE — Progress Notes (Signed)
    Progress Note   Assessment    LBGI bleed with recurrent bleeding last evening, suspected diverticular bleed ABL anemia PE on Eliquis   Recommendations   If significant rebleeding recurs please proceed with a nuclear tagged RBC scan Trend CBC, transfuse to maintain Hgb > 7-8 Continue to hold Eliquis   Chief Complaint   Recurrent bleed last evening, passed small volume of blood this morning. No pain  Vital signs in last 24 hours: Temp:  [98 F (36.7 C)-98.1 F (36.7 C)] 98 F (36.7 C) (10/21 0800) Pulse Rate:  [73-101] 89 (10/21 0800) Resp:  [15-17] 17 (10/21 0800) BP: (132-146)/(44-56) 132/46 (10/21 0800) SpO2:  [100 %] 100 % (10/20 2056) Last BM Date : 02/17/22  General: Alert, well-developed, in NAD Heart:  Regular rate and rhythm; no murmurs Chest: Clear to ascultation bilaterally Abdomen:  Soft, nontender and nondistended. Normal bowel sounds, without guarding, and without rebound.   Extremities:  Without edema. Neurologic:  Alert and  oriented x4; grossly normal neurologically. Psych:  Alert and cooperative. Normal mood and affect.  Intake/Output from previous day: 10/20 0701 - 10/21 0700 In: 600 [P.O.:600] Out: -  Intake/Output this shift: No intake/output data recorded.  Lab Results: Recent Labs    02/16/22 1205 02/16/22 1706 02/16/22 2005 02/17/22 0326 02/18/22 0303  WBC 7.6  --   --  10.2 9.9  HGB 6.1*   < > 7.7* 9.6* 7.9*  HCT 18.1*   < > 22.6* 28.7* 22.3*  PLT 460*  --   --  431* 362   < > = values in this interval not displayed.   BMET Recent Labs    02/16/22 1205 02/17/22 0326 02/18/22 0303  NA 137 136 135  K 4.8 4.0 3.6  CL 104 104 105  CO2 22 21* 21*  GLUCOSE 129* 120* 104*  BUN 43* 35* 31*  CREATININE 2.22* 2.03* 1.79*  CALCIUM 8.8* 9.1 9.0   LFT Recent Labs    02/16/22 1205  PROT 4.8*  ALBUMIN 2.3*  AST 25  ALT 19  ALKPHOS 74  BILITOT 0.3   PT/INR Recent Labs    02/16/22 1205  LABPROT 16.3*  INR 1.3*     Studies/Results: DG Chest Portable 1 View  Result Date: 02/16/2022 CLINICAL DATA:  GI bleeding EXAM: PORTABLE CHEST 1 VIEW COMPARISON:  Previous studies including the examination of 02/06/2022 FINDINGS: Transverse diameter of heart is slightly increased. There is previous coronary bypass surgery. There are no signs of pulmonary edema. There is blunting of lateral CP angles. Small linear densities are seen in right lower lung field. There is no pneumothorax. There is previous surgical fusion in cervical spine. IMPRESSION: Minimal bilateral pleural effusions. Linear densities in right lower lung fields suggest subsegmental atelectasis. Electronically Signed   By: Elmer Picker M.D.   On: 02/16/2022 14:12      LOS: 2 days   Norberto Sorenson T. Fuller Plan, MD 02/18/2022, 9:05 AM See [facility], to contact our on call provider

## 2022-02-18 NOTE — Progress Notes (Signed)
PROGRESS NOTE    [name]  MWU:[id number] DOB: 1940/08/14 DOA: 02/16/2022 PCP: Cassandria Anger, MD    Brief Narrative:  [name] is a 81 y.o. 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   Patient started noticed episode of blood in the stool about 6-7 days ago, intermittent, no abdominal pain denies any chest pain lightheadedness shortness of breath.  Increasedly, she noticed persistent rectal bleed, this morning, she had a large episode of rectal bleed with bright red blood, again denies any lightheadedness chest pain or shortness of breath.  No abdominal pain.  Her last dose of Eliquis was yesterday evening.  She called her PCP who directed her to ED. patient reported similar episode in July, when she was hospitalized and bleeding scan negative for active bleed, but patient was on Plavix at that time but not on Eliquis.   Assessment and Plan: Acute on chronic normocytic anemia -Secondary to acute recurrent lower GI bleed, suspicious of recurrent diverticulosis bleeding vs internal hemorrhoid bleeding. -PRBC x2 -Last dose of Eliquis was 10/18 -Given her chronic kidney function of CKD stage IV, poor candidate for IV contrast CT angiogram, unless life-threatening bleeding happens-- will plan for NM study -GI consultation appreciated. -h/h 10/21 PM    Recently diagnosed PE -Reviewed CT angiogram result, appears to be scattered subsegmental right middle lobe PE with relatively low blood clot burden.  Eliquis on hold for now, depends on clinical course, will decide whether to put patient back on Eliquis  -last U/S did not show DVT   HTN -resume meds as able  CKD  stage IV -Euvolemic, creatinine level at baseline, will discontinue hydrochlorothiazide given the low GFR.   Vulvovaginitis -Miconazole cream   RA -DMARDs on hold   CAD, status post CABG -No acute issue, continue Lipitor -resume BB   Chronic mesenteric artery stenosis -outpatient follow up     DVT prophylaxis:     Code Status: Full Code Family Communication:   Disposition Plan:  Level of care: Telemetry Medical Status is: Inpatient Remains inpatient appropriate because: monitor for further bleeding    Consultants:  GI   Subjective: Bleeding again overnight  Objective: Vitals:   02/17/22 2056 02/18/22 0030 02/18/22 0800 02/18/22 0907  BP:  (!) 134/49 (!) 132/46   Pulse: 98 (!) 101 89 89  Resp: '15  17 17  '$ Temp:   98 F (36.7 C)   TempSrc:   Oral   SpO2: 100%   100%    Intake/Output Summary (Last 24 hours) at 02/18/2022 1240 Last data filed at 02/17/2022 1300 Gross per 24 hour  Intake 240 ml  Output --  Net 240 ml   There were no vitals filed for this visit.  Examination:    General: Appearance:     Overweight female in no acute distress     Lungs:      respirations unlabored  Heart:    Normal heart rate.    MS:   All extremities are intact.   Neurologic:   Awake, alert         Data Reviewed: I have personally reviewed following labs and imaging studies  CBC: Recent Labs  Lab 02/16/22 1205 02/16/22  1706 02/16/22 2005 02/17/22 0326 02/18/22 0303  WBC 7.6  --   --  10.2 9.9  NEUTROABS 3.6  --   --   --   --   HGB 6.1* 8.0* 7.7* 9.6* 7.9*  HCT 18.1* 22.8* 22.6* 28.7* 22.3*  MCV 97.3  --   --  92.0 90.3  PLT 460*  --   --  431* 540   Basic Metabolic Panel: Recent Labs  Lab 02/16/22 1205 02/17/22 0326 02/18/22 0303  NA 137 136 135  K 4.8 4.0 3.6  CL 104 104 105  CO2 22 21* 21*  GLUCOSE 129* 120* 104*  BUN 43* 35* 31*  CREATININE 2.22* 2.03* 1.79*  CALCIUM 8.8* 9.1 9.0   GFR: Estimated Creatinine Clearance: 22.7 mL/min  (A) (by C-G formula based on SCr of 1.79 mg/dL (H)). Liver Function Tests: Recent Labs  Lab 02/16/22 1205  AST 25  ALT 19  ALKPHOS 74  BILITOT 0.3  PROT 4.8*  ALBUMIN 2.3*   No results for input(s): "LIPASE", "AMYLASE" in the last 168 hours. No results for input(s): "AMMONIA" in the last 168 hours. Coagulation Profile: Recent Labs  Lab 02/16/22 1205  INR 1.3*   Cardiac Enzymes: No results for input(s): "CKTOTAL", "CKMB", "CKMBINDEX", "TROPONINI" in the last 168 hours. BNP (last 3 results) No results for input(s): "PROBNP" in the last 8760 hours. HbA1C: No results for input(s): "HGBA1C" in the last 72 hours. CBG: No results for input(s): "GLUCAP" in the last 168 hours. Lipid Profile: No results for input(s): "CHOL", "HDL", "LDLCALC", "TRIG", "CHOLHDL", "LDLDIRECT" in the last 72 hours. Thyroid Function Tests: Recent Labs    02/16/22 1205  TSH 0.985   Anemia Panel: Recent Labs    02/16/22 1706  FERRITIN 128  TIBC 266  IRON 104  RETICCTPCT 3.3*   Sepsis Labs: No results for input(s): "PROCALCITON", "LATICACIDVEN" in the last 168 hours.  No results found for this or any previous visit (from the past 240 hour(s)).       Radiology Studies: DG Chest Portable 1 View  Result Date: 02/16/2022 CLINICAL DATA:  GI bleeding EXAM: PORTABLE CHEST 1 VIEW COMPARISON:  Previous studies including the examination of 02/06/2022 FINDINGS: Transverse diameter of heart is slightly increased. There is previous coronary bypass surgery. There are no signs of pulmonary edema. There is blunting of lateral CP angles. Small linear densities are seen in right lower lung field. There is no pneumothorax. There is previous surgical fusion in cervical spine. IMPRESSION: Minimal bilateral pleural effusions. Linear densities in right lower lung fields suggest subsegmental atelectasis. Electronically Signed   By: Elmer Picker M.D.   On: 02/16/2022 14:12        Scheduled Meds:   arformoterol  15 mcg Nebulization BID   And   umeclidinium bromide  1 puff Inhalation Daily   atorvastatin  80 mg Oral Daily   clotrimazole  1 Applicatorful Vaginal QHS   cyanocobalamin  1,000 mcg Oral Daily   famotidine  20 mg Oral Daily   gabapentin  300 mg Oral QHS   hydrocortisone   Rectal BID   levothyroxine  88 mcg Oral QAC breakfast   metoprolol tartrate  25 mg Oral BID   pantoprazole  40 mg Oral Daily   senna-docusate  2 tablet Oral BID   tamsulosin  0.4 mg Oral Daily   triamcinolone  1 spray Nasal Daily   Continuous Infusions:   LOS: 2 days    Time spent: 45 minutes spent on  chart review, discussion with nursing staff, consultants, updating family and interview/physical exam; more than 50% of that time was spent in counseling and/or coordination of care.    Geradine Girt, DO [facility] Available via Epic secure chat 7am-7pm After these hours, please refer to coverage provider listed on [email or web address] 02/18/2022, 12:40 PM

## 2022-02-18 NOTE — Plan of Care (Signed)

## 2022-02-19 DIAGNOSIS — D62 Acute posthemorrhagic anemia: Secondary | ICD-10-CM | POA: Diagnosis not present

## 2022-02-19 DIAGNOSIS — K922 Gastrointestinal hemorrhage, unspecified: Secondary | ICD-10-CM | POA: Diagnosis not present

## 2022-02-19 DIAGNOSIS — Z7901 Long term (current) use of anticoagulants: Secondary | ICD-10-CM | POA: Diagnosis not present

## 2022-02-19 LAB — BASIC METABOLIC PANEL
Anion gap: 9 (ref 5–15)
BUN: 25 mg/dL — ABNORMAL HIGH (ref 8–23)
CO2: 21 mmol/L — ABNORMAL LOW (ref 22–32)
Calcium: 9.3 mg/dL (ref 8.9–10.3)
Chloride: 105 mmol/L (ref 98–111)
Creatinine, Ser: 1.85 mg/dL — ABNORMAL HIGH (ref 0.44–1.00)
GFR, Estimated: 27 mL/min — ABNORMAL LOW (ref >60)
Glucose, Bld: 105 mg/dL — ABNORMAL HIGH (ref 70–99)
Potassium: 3.7 mmol/L (ref 3.5–5.1)
Sodium: 135 mmol/L (ref 135–145)

## 2022-02-19 LAB — CBC
HCT: 23.6 % — ABNORMAL LOW (ref 36.0–46.0)
Hemoglobin: 8 g/dL — ABNORMAL LOW (ref 12.0–15.0)
MCH: 31.5 pg (ref 26.0–34.0)
MCHC: 33.9 g/dL (ref 30.0–36.0)
MCV: 92.9 fL (ref 80.0–100.0)
Platelets: 355 10*3/uL (ref 150–400)
RBC: 2.54 MIL/uL — ABNORMAL LOW (ref 3.87–5.11)
RDW: 16.5 % — ABNORMAL HIGH (ref 11.5–15.5)
WBC: 9.6 10*3/uL (ref 4.0–10.5)
nRBC: 1.7 % — ABNORMAL HIGH (ref 0.0–0.2)

## 2022-02-19 MED ORDER — LOPERAMIDE HCL 2 MG PO CAPS
2.0000 mg | ORAL_CAPSULE | Freq: Once | ORAL | Status: AC
  Administered 2022-02-19: 2 mg via ORAL
  Filled 2022-02-19: qty 1

## 2022-02-19 NOTE — Progress Notes (Addendum)
    Progress Note   Assessment    LGI bleed, suspected diverticular bleed, resolved  ABL anemia, Hgb=8.0 PE   Recommendations   Advance diet today  Trend CBC, transfuse to maintain Hgb > 7-8 Continue to hold Eliquis for at least 3 more days Avoid ASA/NSAID for at least 7 days If no rebleeding by tomorrow then ok for discharge from GI standpoint.  Outpatient GI follow up with Dr. Havery Moros in 1 month. GI available as needed.   Chief Complaint   Pt has had two episodes of mild diarrhea without blood per nursing staff. Last was around midnight.   Vital signs in last 24 hours: Temp:  [97.8 F (36.6 C)-98.6 F (37 C)] 98.6 F (37 C) (10/22 0748) Pulse Rate:  [90-94] 90 (10/22 0748) Resp:  [17-18] 18 (10/22 0748) BP: (95-141)/(52-74) 114/74 (10/22 0748) SpO2:  [95 %-100 %] 95 % (10/22 0748) Last BM Date : 02/17/22  General: Alert, well-developed, in NAD Heart:  Regular rate and rhythm; no murmurs Chest: Clear to ascultation bilaterally Abdomen:  Soft, nontender and nondistended. Normal bowel sounds, without guarding, and without rebound.   Extremities:  Without edema. Neurologic:  Alert and  oriented x4; grossly normal neurologically. Psych:  Alert and cooperative. Normal mood and affect.  Intake/Output from previous day: 10/21 0701 - 10/22 0700 In: 360 [P.O.:360] Out: 5 [Urine:3; Stool:2] Intake/Output this shift: No intake/output data recorded.  Lab Results: Recent Labs    02/17/22 0326 02/18/22 0303 02/18/22 1536 02/19/22 0245  WBC 10.2 9.9  --  9.6  HGB 9.6* 7.9* 8.7* 8.0*  HCT 28.7* 22.3* 26.4* 23.6*  PLT 431* 362  --  355   BMET Recent Labs    02/17/22 0326 02/18/22 0303 02/19/22 0245  NA 136 135 135  K 4.0 3.6 3.7  CL 104 105 105  CO2 21* 21* 21*  GLUCOSE 120* 104* 105*  BUN 35* 31* 25*  CREATININE 2.03* 1.79* 1.85*  CALCIUM 9.1 9.0 9.3   LFT Recent Labs    02/16/22 1205  PROT 4.8*  ALBUMIN 2.3*  AST 25  ALT 19  ALKPHOS 74   BILITOT 0.3   PT/INR Recent Labs    02/16/22 1205  LABPROT 16.3*  INR 1.3*     LOS: 3 days   Rohail Klees T. Fuller Plan, MD 02/19/2022, 9:09 AM See Shea Evans, [facility], to contact our on call provider

## 2022-02-19 NOTE — Progress Notes (Signed)
PROGRESS NOTE    [name]  GYI:[id number] DOB: 05-17-1940 DOA: 02/16/2022 PCP: Cassandria Anger, MD    Brief Narrative:  [name] is a 81 y.o. 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   Patient started noticed episode of blood in the stool about 6-7 days ago, intermittent, no abdominal pain denies any chest pain lightheadedness shortness of breath.  Increasedly, she noticed persistent rectal bleed, this morning, she had a large episode of rectal bleed with bright red blood, again denies any lightheadedness chest pain or shortness of breath.  No abdominal pain.  Her last dose of Eliquis was yesterday evening.  She called her PCP who directed her to ED. patient reported similar episode in July, when she was hospitalized and bleeding scan negative for active bleed, but patient was on Plavix at that time but not on Eliquis.   Assessment and Plan: Acute on chronic normocytic anemia -Secondary to acute recurrent lower GI bleed, suspicious of recurrent diverticulosis bleeding vs internal hemorrhoid bleeding. -PRBC x2 -Last dose of Eliquis was 10/18 -Given her chronic kidney function of CKD stage IV, poor candidate for IV contrast CT angiogram, unless life-threatening bleeding happens-- will plan for NM study -GI consultation appreciated. -h/h stable    Recently diagnosed PE -Reviewed CT angiogram result, appears to be scattered subsegmental right middle lobe PE with relatively low blood clot burden.  Eliquis on hold for now, depends on clinical course, will decide whether to put patient back on Eliquis  -last U/S did not show DVT   HTN -resume meds as able  CKD  stage IV -Euvolemic, creatinine level at baseline, will discontinue hydrochlorothiazide given the low GFR.   Vulvovaginitis -Miconazole cream   RA -DMARDs on hold   CAD, status post CABG -No acute issue, continue Lipitor -resume BB   Chronic mesenteric artery stenosis -outpatient follow up     DVT prophylaxis:     Code Status: Full Code Family Communication:   Disposition Plan:  Level of care: Telemetry Medical Status is: Inpatient Remains inpatient appropriate because: home in AM    Consultants:  GI   Subjective: No bleeding  Objective: Vitals:   02/18/22 0907 02/18/22 1947 02/19/22 0344 02/19/22 0748  BP:  (!) 141/52 95/74 114/74  Pulse: 89 94 94 90  Resp: '17 18 17 18  '$ Temp:  97.9 F (36.6 C) 97.8 F (36.6 C) 98.6 F (37 C)  TempSrc:  Oral Oral   SpO2: 100% 100% 100% 95%    Intake/Output Summary (Last 24 hours) at 02/19/2022 1114 Last data filed at 02/19/2022 1000 Gross per 24 hour  Intake 360 ml  Output 6 ml  Net 354 ml   There were no vitals filed for this visit.  Examination:    General: Appearance:     Overweight female in no acute distress     Lungs:     respirations unlabored  Heart:    Normal heart rate.   MS:   All extremities are intact.   Neurologic:   Awake, alert         Data Reviewed: I have personally reviewed following labs and imaging studies  CBC: Recent Labs  Lab 02/16/22 1205 02/16/22  1706 02/16/22 2005 02/17/22 0326 02/18/22 0303 02/18/22 1536 02/19/22 0245  WBC 7.6  --   --  10.2 9.9  --  9.6  NEUTROABS 3.6  --   --   --   --   --   --   HGB 6.1*   < > 7.7* 9.6* 7.9* 8.7* 8.0*  HCT 18.1*   < > 22.6* 28.7* 22.3* 26.4* 23.6*  MCV 97.3  --   --  92.0 90.3  --  92.9  PLT 460*  --   --  431* 362  --  355   < > = values in this interval not displayed.   Basic Metabolic Panel: Recent Labs  Lab 02/16/22 1205 02/17/22 0326 02/18/22 0303 02/19/22 0245  NA 137 136 135 135  K 4.8 4.0 3.6 3.7  CL 104 104  105 105  CO2 22 21* 21* 21*  GLUCOSE 129* 120* 104* 105*  BUN 43* 35* 31* 25*  CREATININE 2.22* 2.03* 1.79* 1.85*  CALCIUM 8.8* 9.1 9.0 9.3   GFR: Estimated Creatinine Clearance: 21.9 mL/min (A) (by C-G formula based on SCr of 1.85 mg/dL (H)). Liver Function Tests: Recent Labs  Lab 02/16/22 1205  AST 25  ALT 19  ALKPHOS 74  BILITOT 0.3  PROT 4.8*  ALBUMIN 2.3*   No results for input(s): "LIPASE", "AMYLASE" in the last 168 hours. No results for input(s): "AMMONIA" in the last 168 hours. Coagulation Profile: Recent Labs  Lab 02/16/22 1205  INR 1.3*   Cardiac Enzymes: No results for input(s): "CKTOTAL", "CKMB", "CKMBINDEX", "TROPONINI" in the last 168 hours. BNP (last 3 results) No results for input(s): "PROBNP" in the last 8760 hours. HbA1C: No results for input(s): "HGBA1C" in the last 72 hours. CBG: No results for input(s): "GLUCAP" in the last 168 hours. Lipid Profile: No results for input(s): "CHOL", "HDL", "LDLCALC", "TRIG", "CHOLHDL", "LDLDIRECT" in the last 72 hours. Thyroid Function Tests: Recent Labs    02/16/22 1205  TSH 0.985   Anemia Panel: Recent Labs    02/16/22 1706  FERRITIN 128  TIBC 266  IRON 104  RETICCTPCT 3.3*   Sepsis Labs: No results for input(s): "PROCALCITON", "LATICACIDVEN" in the last 168 hours.  No results found for this or any previous visit (from the past 240 hour(s)).       Radiology Studies: No results found.      Scheduled Meds:  arformoterol  15 mcg Nebulization BID   And   umeclidinium bromide  1 puff Inhalation Daily   atorvastatin  80 mg Oral Daily   clotrimazole  1 Applicatorful Vaginal QHS   cyanocobalamin  1,000 mcg Oral Daily   famotidine  20 mg Oral Daily   gabapentin  300 mg Oral QHS   hydrocortisone   Rectal BID   levothyroxine  88 mcg Oral QAC breakfast   metoprolol tartrate  25 mg Oral BID   pantoprazole  40 mg Oral Daily   senna-docusate  2 tablet Oral BID   tamsulosin  0.4 mg Oral Daily    triamcinolone  1 spray Nasal Daily   Continuous Infusions:   LOS: 3 days    Time spent: 45 minutes spent on chart review, discussion with nursing staff, consultants, updating family and interview/physical exam; more than 50% of that time was spent in counseling and/or coordination of care.    Geradine Girt, DO [facility] Available via Epic secure chat 7am-7pm After these hours, please refer to coverage provider listed on [email or web address] 02/19/2022,  11:14 AM

## 2022-02-20 DIAGNOSIS — K922 Gastrointestinal hemorrhage, unspecified: Secondary | ICD-10-CM | POA: Diagnosis not present

## 2022-02-20 LAB — CBC
HCT: 21.5 % — ABNORMAL LOW (ref 36.0–46.0)
Hemoglobin: 7.4 g/dL — ABNORMAL LOW (ref 12.0–15.0)
MCH: 31.9 pg (ref 26.0–34.0)
MCHC: 34.4 g/dL (ref 30.0–36.0)
MCV: 92.7 fL (ref 80.0–100.0)
Platelets: 343 10*3/uL (ref 150–400)
RBC: 2.32 MIL/uL — ABNORMAL LOW (ref 3.87–5.11)
RDW: 16.4 % — ABNORMAL HIGH (ref 11.5–15.5)
WBC: 9.7 10*3/uL (ref 4.0–10.5)
nRBC: 1.5 % — ABNORMAL HIGH (ref 0.0–0.2)

## 2022-02-20 LAB — HEMOGLOBIN AND HEMATOCRIT, BLOOD
HCT: 23.3 % — ABNORMAL LOW (ref 36.0–46.0)
Hemoglobin: 7.8 g/dL — ABNORMAL LOW (ref 12.0–15.0)

## 2022-02-20 LAB — PREPARE RBC (CROSSMATCH)

## 2022-02-20 MED ORDER — SODIUM CHLORIDE 0.9% IV SOLUTION: Freq: Once | INTRAVENOUS | Status: DC

## 2022-02-20 NOTE — Progress Notes (Signed)
Mobility Specialist Progress Note   02/20/22 1034  Mobility  Activity Ambulated with assistance in hallway  Level of Assistance Contact guard assist, steadying assist  Assistive Device Front wheel walker  Distance Ambulated (ft) 240 ft  Activity Response Tolerated well  $Mobility charge 1 Mobility   Pt found in chair having c/o fatigue but agreeable to mobility. No faults during ambulation but pt expressing increased fatigue post mobility. Left in chair w/ call bell in reach and needs met.  Holland Falling Mobility Specialist Acute Rehab Office:  [phone]

## 2022-02-20 NOTE — Progress Notes (Signed)
PROGRESS NOTE    [name]  WJX:[id number] DOB: [birth year] DOA: 02/16/2022 PCP: Cassandria Anger, MD    Brief Narrative:  [name] is a 81 y.o. 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   Patient started noticed episode of blood in the stool about 6-7 days ago, intermittent, no abdominal pain denies any chest pain lightheadedness shortness of breath.  Increasedly, she noticed persistent rectal bleed, this morning, she had a large episode of rectal bleed with bright red blood, again denies any lightheadedness chest pain or shortness of breath.  No abdominal pain.  Her last dose of Eliquis was yesterday evening.  She called her PCP who directed her to ED. patient reported similar episode in July, when she was hospitalized and bleeding scan negative for active bleed, but patient was on Plavix at that time but not on Eliquis.   Assessment and Plan: Acute on chronic normocytic anemia -Secondary to acute recurrent lower GI bleed, suspicious of recurrent diverticulosis bleeding vs internal hemorrhoid bleeding. -PRBC x2 -Last dose of Eliquis was 10/18 -Given her chronic kidney function of CKD stage IV, poor candidate for IV contrast CT angiogram, unless life-threatening bleeding happens-- will plan for NM study -GI consultation appreciated. -h/h dropped with some bleeding overnight-- will transfuse 1 unit PRBC and recheck and monitor for bleeding -eliquis held for now    Recently diagnosed PE -Reviewed CT angiogram result, appears to be scattered subsegmental right middle lobe PE with relatively low blood clot burden.  Eliquis on hold for now, depends on clinical course,  will decide whether to put patient back on Eliquis  -last U/S did not show DVT   HTN -resume meds as able  CKD stage IV -Euvolemic, creatinine level at baseline, will discontinue hydrochlorothiazide given the low GFR.   Vulvovaginitis -Miconazole cream   RA -DMARDs on hold   CAD, status post CABG -No acute issue, continue Lipitor -resume BB   Chronic mesenteric artery stenosis -outpatient follow up     DVT prophylaxis:     Code Status: Full Code   Disposition Plan:  Level of care: Med-Surg Status is: Inpatient Remains inpatient appropriate because: needs resolution of GI bleeding    Consultants:  GI   Subjective: Had bleeding overnight  Objective: Vitals:   02/19/22 2047 02/20/22 0939 02/20/22 1121 02/20/22 1145  BP:  111/64 122/65 107/78  Pulse:  98 91 92  Resp:   17 17  Temp:  97.9 F (36.6 C) 98.4 F (36.9 C) 98.4 F (36.9 C)  TempSrc:  Oral Oral Oral  SpO2: 96% 94% 98% 98%    Intake/Output Summary (Last 24 hours) at 02/20/2022 1218 Last data filed at 02/19/2022 2150 Gross per 24 hour  Intake 480 ml  Output 400 ml  Net 80 ml   There were no vitals filed for this visit.  Examination:   General: Appearance:     Overweight female in no acute distress     Lungs:     Clear to auscultation bilaterally, respirations unlabored  Heart:    Normal heart rate.  MS:   All extremities are intact.   Neurologic:   Awake, mildly confused this AM (seems to improve as the  days goes on)       Data Reviewed: I have personally reviewed following labs and imaging studies  CBC: Recent Labs  Lab 02/16/22 1205 02/16/22 1706 02/17/22 0326 02/18/22 0303 02/18/22 1536 02/19/22 0245 02/20/22 0236 02/20/22 0922  WBC 7.6  --  10.2 9.9  --  9.6 9.7  --   NEUTROABS 3.6  --   --   --   --   --   --   --   HGB 6.1*   < > 9.6* 7.9* 8.7* 8.0* 7.4* 7.8*  HCT 18.1*   < > 28.7* 22.3* 26.4* 23.6* 21.5* 23.3*  MCV 97.3  --  92.0 90.3  --  92.9 92.7  --   PLT  460*  --  431* 362  --  355 343  --    < > = values in this interval not displayed.   Basic Metabolic Panel: Recent Labs  Lab 02/16/22 1205 02/17/22 0326 02/18/22 0303 02/19/22 0245  NA 137 136 135 135  K 4.8 4.0 3.6 3.7  CL 104 104 105 105  CO2 22 21* 21* 21*  GLUCOSE 129* 120* 104* 105*  BUN 43* 35* 31* 25*  CREATININE 2.22* 2.03* 1.79* 1.85*  CALCIUM 8.8* 9.1 9.0 9.3   GFR: Estimated Creatinine Clearance: 21.9 mL/min (A) (by C-G formula based on SCr of 1.85 mg/dL (H)). Liver Function Tests: Recent Labs  Lab 02/16/22 1205  AST 25  ALT 19  ALKPHOS 74  BILITOT 0.3  PROT 4.8*  ALBUMIN 2.3*   No results for input(s): "LIPASE", "AMYLASE" in the last 168 hours. No results for input(s): "AMMONIA" in the last 168 hours. Coagulation Profile: Recent Labs  Lab 02/16/22 1205  INR 1.3*   Cardiac Enzymes: No results for input(s): "CKTOTAL", "CKMB", "CKMBINDEX", "TROPONINI" in the last 168 hours. BNP (last 3 results) No results for input(s): "PROBNP" in the last 8760 hours. HbA1C: No results for input(s): "HGBA1C" in the last 72 hours. CBG: No results for input(s): "GLUCAP" in the last 168 hours. Lipid Profile: No results for input(s): "CHOL", "HDL", "LDLCALC", "TRIG", "CHOLHDL", "LDLDIRECT" in the last 72 hours. Thyroid Function Tests: No results for input(s): "TSH", "T4TOTAL", "FREET4", "T3FREE", "THYROIDAB" in the last 72 hours.  Anemia Panel: No results for input(s): "VITAMINB12", "FOLATE", "FERRITIN", "TIBC", "IRON", "RETICCTPCT" in the last 72 hours.  Sepsis Labs: No results for input(s): "PROCALCITON", "LATICACIDVEN" in the last 168 hours.  No results found for this or any previous visit (from the past 240 hour(s)).       Radiology Studies: No results found.      Scheduled Meds:  sodium chloride   Intravenous Once   arformoterol  15 mcg Nebulization BID   And   umeclidinium bromide  1 puff Inhalation Daily   atorvastatin  80 mg Oral Daily    clotrimazole  1 Applicatorful Vaginal QHS   cyanocobalamin  1,000 mcg Oral Daily   famotidine  20 mg Oral Daily   gabapentin  300 mg Oral QHS   hydrocortisone   Rectal BID   levothyroxine  88 mcg Oral QAC breakfast   metoprolol tartrate  25 mg Oral BID   pantoprazole  40 mg Oral Daily   senna-docusate  2 tablet Oral BID   tamsulosin  0.4 mg Oral Daily   triamcinolone  1 spray Nasal Daily   Continuous Infusions:   LOS: 4 days    Time spent: 45 minutes spent on chart review, discussion with nursing staff, consultants, updating  family and interview/physical exam; more than 50% of that time was spent in counseling and/or coordination of care.    Geradine Girt, DO [facility] Available via Epic secure chat 7am-7pm After these hours, please refer to coverage provider listed on [email or web address] 02/20/2022, 12:18 PM

## 2022-02-20 NOTE — Plan of Care (Signed)
  Problem: Activity: Goal: Ability to tolerate increased activity will improve Outcome: Progressing   Problem: Education: Goal: Knowledge of General Education information will improve Description: Including pain rating scale, medication(s)/side effects and non-pharmacologic comfort measures Outcome: Progressing   Problem: Health Behavior/Discharge Planning: Goal: Ability to manage health-related needs will improve Outcome: Progressing   Problem: Nutrition: Goal: Adequate nutrition will be maintained Outcome: Progressing   Problem: Elimination: Goal: Will not experience complications related to bowel motility Outcome: Progressing

## 2022-02-20 NOTE — Progress Notes (Signed)
Mobility Specialist Progress Note   02/20/22 1552  Mobility  Activity Ambulated with assistance in hallway  Level of Assistance Standby assist, set-up cues, supervision of patient - no hands on  Assistive Device Front wheel walker  Distance Ambulated (ft) 550 ft  Activity Response Tolerated well  $Mobility charge 1 Mobility   Pt received in chair agreeable to mobility. X1 standing rest break d/t fatigue, encouraged pursed lip breathing x5 and pt able to continue after bout. Returned to room w/o fault, call bell in reach and needs met.   Holland Falling Mobility Specialist Acute Rehab Office:  [phone]

## 2022-02-20 NOTE — Care Management Important Message (Signed)
Important Message  Patient Details  Name: [name] MRN: [id number] Date of Birth: May 31, 1940   Medicare Important Message Given:  Yes     Hannah Beat 02/20/2022, 2:19 PM

## 2022-02-20 NOTE — Evaluation (Signed)
Physical Therapy Evaluation  Patient Details Name: [name] MRN: [id number] DOB: 02-27-1941 Today's Date: 02/20/2022  History of Present Illness  Pt is an 81 y/o female who presents from SNF with worsening of lower GI bleed. PMH significant for recently diagnosed PE and DVT on Eliquis, lower GI bleed 2 diverticulosis, chronic anemia 2 CKD, AAA and splenic artery rupture s/p repair and splenectomy, chronic SMA stenosis, CAD s/p CABG, CKD IV, HTN, COPD, L THA.   Clinical Impression  Pt admitted with above diagnosis. Pt currently with functional limitations due to the deficits listed below (see PT Problem List). At the time of PT eval pt was able to perform transfers and ambulation with gross min guard assist and RW for support. Pt reports feeling good while ambulating, no dizziness or lightheadedness endorsed. Pt anticipates d/c back to SNF where she will resume rehab s/p her total hip replacement. Acutely, pt will benefit from skilled PT to increase their independence and safety with mobility to allow discharge to the venue listed below.          Recommendations for follow up therapy are one component of a multi-disciplinary discharge planning process, led by the attending physician.  Recommendations may be updated based on patient status, additional functional criteria and insurance authorization.  Follow Up Recommendations Skilled nursing-short term rehab (<3 hours/day) Can patient physically be transported by private vehicle: Yes    Assistance Recommended at Discharge Intermittent Supervision/Assistance  Patient can return home with the following  A little help with walking and/or transfers;A little help with bathing/dressing/bathroom;Assistance with cooking/housework;Assist for transportation;Help with stairs or ramp for entrance    Equipment Recommendations None recommended by PT  Recommendations for Other Services       Functional Status Assessment Patient has had a recent  decline in their functional status and demonstrates the ability to make significant improvements in function in a reasonable and predictable amount of time.     Precautions / Restrictions Precautions Precautions: Fall Precaution Comments: Recent L hip replacement Restrictions Weight Bearing Restrictions: No LLE Weight Bearing: Weight bearing as tolerated      Mobility  Bed Mobility               General bed mobility comments: Pt was received sitting up in the recliner    Transfers Overall transfer level: Needs assistance Equipment used: Rolling walker (2 wheels) Transfers: Sit to/from Stand Sit to Stand: Min guard           General transfer comment: Close guard for safety as pt powered up to full stand. Pt demonstrated proper hand placement on seated surface for safety.    Ambulation/Gait Ambulation/Gait assistance: Min guard Gait Distance (Feet): 200 Feet Assistive device: Rolling walker (2 wheels) Gait Pattern/deviations: Step-through pattern, Decreased stride length, Drifts right/left, Trunk flexed Gait velocity: Decreased Gait velocity interpretation: <1.8 ft/sec, indicate of risk for recurrent falls   General Gait Details: VC's for improved posture, closer walker proximity, and forward gaze. No assist required however close guard provided for safety.  Stairs            Wheelchair Mobility    Modified Rankin (Stroke Patients Only)       Balance Overall balance assessment: Needs assistance Sitting-balance support: Feet supported Sitting balance-Leahy Scale: Good     Standing balance support: Single extremity supported, Reliant on assistive device for balance Standing balance-Leahy Scale: Poor  Pertinent Vitals/Pain Pain Assessment Pain Assessment: Faces Faces Pain Scale: Hurts a little bit Pain Location: L hip Pain Descriptors / Indicators: Operative site guarding Pain Intervention(s): Limited  activity within patient's tolerance, Monitored during session, Repositioned    Home Living Family/patient expects to be discharged to:: Skilled nursing facility                   Additional Comments: Pt presented from SN[facility] re[facility] at [facility]    Prior Function Prior Level of Function : Needs assist             Mobility Comments: RW ADLs Comments: Sponge bathing since THA, staff assists with most ADLs     Hand Dominance   Dominant Hand: Left    Extremity/Trunk Assessment   Upper Extremity Assessment Upper Extremity Assessment: Defer to OT evaluation    Lower Extremity Assessment Lower Extremity Assessment: LLE deficits/detail LLE Deficits / Details: Decreased strength and muscular endurance consistent with recent THA. Did not test ROM of the hip    Cervical / Trunk Assessment Cervical / Trunk Assessment: Kyphotic (Forward head posture with rounded shoulders)  Communication   Communication: No difficulties  Cognition Arousal/Alertness: Awake/alert Behavior During Therapy: WFL for tasks assessed/performed, Flat affect Overall Cognitive Status: Within Functional Limits for tasks assessed                                          General Comments      Exercises     Assessment/Plan    PT Assessment Patient needs continued PT services  PT Problem List Decreased strength;Decreased range of motion;Decreased activity tolerance;Decreased balance;Decreased mobility;Decreased knowledge of use of DME;Decreased safety awareness;Decreased knowledge of precautions;Pain       PT Treatment Interventions DME instruction;Gait training;Stair training;Functional mobility training;Therapeutic activities;Therapeutic exercise;Balance training;Neuromuscular re-education;Cognitive remediation;Patient/family education    PT Goals (Current goals can be found in the Care Plan section)  Acute Rehab PT Goals Patient Stated Goal: Be able to go back to her  independent living apartment PT Goal Formulation: With patient/family Time For Goal Achievement: 02/27/22 Potential to Achieve Goals: Good    Frequency Min 3X/week     Co-evaluation               AM-PAC PT "6 Clicks" Mobility  Outcome Measure Help needed turning from your back to your side while in a flat bed without using bedrails?: A Little Help needed moving from lying on your back to sitting on the side of a flat bed without using bedrails?: A Little Help needed moving to and from a bed to a chair (including a wheelchair)?: A Little Help needed standing up from a chair using your arms (e.g., wheelchair or bedside chair)?: A Little Help needed to walk in hospital room?: A Little Help needed climbing 3-5 steps with a railing? : A Little 6 Click Score: 18    End of Session Equipment Utilized During Treatment: Gait belt Activity Tolerance: Patient tolerated treatment well Patient left: in chair;with call bell/phone within reach;with family/visitor present Nurse Communication: Mobility status PT Visit Diagnosis: Unsteadiness on feet (R26.81);Difficulty in walking, not elsewhere classified (R26.2)    Time: 2426-8341 PT Time Calculation (min) (ACUTE ONLY): 20 min   Charges:   PT Evaluation $PT Eval Moderate Complexity: 1 Mod          Rolinda Roan, PT, DPT Acute Rehabilitation Services Secure Chat  Preferred Office: [phone]   Thelma Comp 02/20/2022, 3:56 PM

## 2022-02-21 DIAGNOSIS — K922 Gastrointestinal hemorrhage, unspecified: Secondary | ICD-10-CM | POA: Diagnosis not present

## 2022-02-21 LAB — TYPE AND SCREEN
ABO/RH(D): O POS
Antibody Screen: NEGATIVE
Unit division: 0

## 2022-02-21 LAB — CBC
HCT: 26.9 % — ABNORMAL LOW (ref 36.0–46.0)
Hemoglobin: 9.3 g/dL — ABNORMAL LOW (ref 12.0–15.0)
MCH: 30.6 pg (ref 26.0–34.0)
MCHC: 34.6 g/dL (ref 30.0–36.0)
MCV: 88.5 fL (ref 80.0–100.0)
Platelets: 359 10*3/uL (ref 150–400)
RBC: 3.04 MIL/uL — ABNORMAL LOW (ref 3.87–5.11)
RDW: 18.3 % — ABNORMAL HIGH (ref 11.5–15.5)
WBC: 9.8 10*3/uL (ref 4.0–10.5)
nRBC: 1.3 % — ABNORMAL HIGH (ref 0.0–0.2)

## 2022-02-21 LAB — BPAM RBC
Blood Product Expiration Date: 202311182359
ISSUE DATE / TIME: 202310231114
Unit Type and Rh: 5100

## 2022-02-21 MED ORDER — APIXABAN 5 MG PO TABS: 5.0000 mg | ORAL_TABLET | Freq: Two times a day (BID) | ORAL | Status: DC

## 2022-02-21 NOTE — NC FL2 (Signed)
[place] LEVEL OF CARE SCREENING TOOL     IDENTIFICATION  Patient Name: [name] Birthdate: [birth year] Sex: female Admission Date (Current Location): 02/16/2022  [facility] and [place] Number:  Herbalist and Address:  The . [facility], [place] [address] [place], [place] [address]      Provider Number: [id number]  Attending Physician Name and Address:  Geradine Girt, DO  Relative Name and Phone Number:  Arnold, [place] [phone]  [phone]    Current Level of Care: Hospital Recommended Level of Care: [place] Prior Approval Number:    Date Approved/Denied:   PASRR Number: [id number]  Discharge Plan: SNF    Current Diagnoses: Patient Active Problem List   Diagnosis Date Noted   Lower GI bleed 02/16/2022   Tachycardia 02/06/2022   Acute pulmonary embolism ([place]) 02/06/2022   Urinary retention 01/30/2022   DVT (deep venous thrombosis) ([place]) 01/30/2022   Vitamin D deficiency 01/30/2022   Slow transit constipation 01/30/2022   GERD (gastroesophageal reflux disease) 01/30/2022   Status post total replacement of right hip 01/29/2022   Status post total replacement of left hip 01/23/2022   Lower GI bleeding 11/18/2021   Hematochezia    Acute blood loss anemia (ABLA)    BRBPR (bright red blood per rectum) 11/17/2021   CKD (chronic kidney disease) stage 4, GFR 15-29 ml/min (HCC) 11/17/2021   Primary osteoarthritis of left hip 10/06/2021   Left hip pain 12/09/2020   Aortic atherosclerosis ([place]) 09/07/2020   Gait disorder 06/08/2020   Neoplasm of uncertain behavior of skin 03/14/2020   Contusion of face 03/10/2020   Arthralgia 12/25/2019   Ankle swelling 08/08/2019   Hyperkalemia 05/15/2019   Osteoporosis 02/26/2018   Colon polyp 06/28/2016   COPD GOLD I 05/28/2016   Upper airway cough syndrome 05/22/2016   Wrist pain, right 12/14/2015   Lung mass 03/02/2015   Pre-operative cardiovascular  examination 02/21/2015   DOE (dyspnea on exertion) 02/16/2015   TIA (transient ischemic attack) 12/31/2014   Abnormal mental state 12/30/2014   Diarrhea 11/26/2014   Well adult exam 11/26/2014   Seasonal allergies 12/31/2013   Splenic artery aneurysm ([place]) 04/07/2013   Cerumen impaction 03/20/2013   Dyslipidemia, goal LDL below 70    Mesenteric artery stenosis ([place]) 09/04/2012   Long term (current) use of anticoagulants 11/08/2010   Rheumatoid arthritis ([place]) 06/23/2010   Pain in joint 11/26/2009   Hypothyroidism 12/08/2006   Macular degeneration (senile) of retina 12/08/2006   Essential hypertension 12/08/2006   Diaphragmatic hernia 12/08/2006   ASPLENIA 12/08/2006   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) 12/22/1994   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; now atretic and significantly improved LAD lesion without intervention 12/22/1994    Orientation RESPIRATION BLADDER Height & Weight     Self, Time, Situation, Place  Normal Continent Weight:   Height:     BEHAVIORAL SYMPTOMS/MOOD NEUROLOGICAL BOWEL NUTRITION STATUS      Continent Diet (see discharge summary)  AMBULATORY STATUS COMMUNICATION OF NEEDS Skin   Limited Assist Verbally Surgical wounds                       Personal Care Assistance Level of Assistance    Bathing Assistance: Limited assistance   Dressing Assistance: Independent     Functional Limitations Info  Sight, Hearing, Speech Sight Info: Impaired Hearing Info: Adequate Speech  Info: Adequate    SPECIAL CARE FACTORS FREQUENCY  PT (By licensed PT), OT (By licensed OT)     PT Frequency: 5x week OT Frequency: 5x week            Contractures Contractures Info: Not present    Additional Factors Info  Code Status, Allergies Code Status Info: full Allergies Info: Nsaids, Aspirin, Codeine, Nitrostat (Nitroglycerin)           Current Medications (02/21/2022):  This is  the current hospital active medication list Current Facility-Administered Medications  Medication Dose Route Frequency Provider Last Rate Last Admin   0.9 %  sodium chloride infusion (Manually program via Guardrails IV Fluids)   Intravenous Once Vann, Jessica U, DO       acetaminophen (TYLENOL) tablet 650 mg  650 mg Oral Q6H PRN Wynetta Fines T, MD       albuterol (PROVENTIL) (2.5 MG/3ML) 0.083% nebulizer solution 2.5 mg  2.5 mg Nebulization Q6H PRN Wynetta Fines T, MD       arformoterol [facility]) nebulizer solution 15 mcg  15 mcg Nebulization BID Wynetta Fines T, MD   15 mcg at 02/21/22 7510   And   umeclidinium bromide (INCRUSE ELLIPTA) 62.5 MCG/ACT 1 puff  1 puff Inhalation Daily Wynetta Fines T, MD   1 puff at 02/21/22 0835   atorvastatin (LIPITOR) tablet 80 mg  80 mg Oral Daily Wynetta Fines T, MD   80 mg at 02/21/22 2585   barrier cream (non-specified) 1 Application  1 Application Topical BID PRN Eulogio Bear U, DO       clotrimazole (GYNE-LOTRIMIN) vaginal cream 1 Applicatorful  1 Applicatorful Vaginal QHS Lequita Halt, MD   1 Applicatorful at 27/78/24 2137   cyanocobalamin (VITAMIN B12) tablet 1,000 mcg  1,000 mcg Oral Daily Wynetta Fines T, MD   1,000 mcg at 02/21/22 0808   famotidine (PEPCID) tablet 20 mg  20 mg Oral Daily Wynetta Fines T, MD   20 mg at 02/21/22 2353   gabapentin (NEURONTIN) capsule 300 mg  300 mg Oral QHS Wynetta Fines T, MD   300 mg at 02/20/22 2136   hydrALAZINE (APRESOLINE) tablet 25 mg  25 mg Oral Q6H PRN Wynetta Fines T, MD   25 mg at 02/16/22 1622   hydrocortisone (ANUSOL-HC) 2.5 % rectal cream   Rectal BID Lequita Halt, MD   Given at 02/21/22 0809   levothyroxine (SYNTHROID) tablet 88 mcg  88 mcg Oral QAC breakfast Wynetta Fines T, MD   88 mcg at 02/21/22 0539   metoprolol tartrate (LOPRESSOR) tablet 25 mg  25 mg Oral BID Eulogio Bear U, DO   25 mg at 02/21/22 0807   pantoprazole (PROTONIX) EC tablet 40 mg  40 mg Oral Daily Wynetta Fines T, MD   40 mg at 02/21/22 6144    senna-docusate (Senokot-S) tablet 2 tablet  2 tablet Oral BID Wynetta Fines T, MD   2 tablet at 02/18/22 2216   tamsulosin (FLOMAX) capsule 0.4 mg  0.4 mg Oral Daily Wynetta Fines T, MD   0.4 mg at 02/21/22 3154   triamcinolone (NASACORT) nasal inhaler 1 spray  1 spray Nasal Daily Lequita Halt, MD   1 spray at 02/21/22 0809     Discharge Medications: Please see discharge summary for a list of discharge medications.  Relevant Imaging Results:  Relevant Lab Results:   Additional Information SSN: [id number]  Joanne Chars, LCSW

## 2022-02-21 NOTE — Plan of Care (Signed)
  Problem: Education: Goal: Knowledge of the prescribed therapeutic regimen will improve Outcome: Progressing   Problem: Activity: Goal: Ability to avoid complications of mobility impairment will improve Outcome: Progressing   Problem: Pain Management: Goal: Pain level will decrease with appropriate interventions Outcome: Progressing   Problem: Safety: Goal: Ability to remain free from injury will improve Outcome: Progressing   Problem: Clinical Measurements: Goal: Postoperative complications will be avoided or minimized Outcome: Progressing

## 2022-02-21 NOTE — Discharge Summary (Signed)
Physician Discharge Summary  [name] FHL:[id number] DOB: 09-Oct-1940 DOA: 02/16/2022  PCP: Cassandria Anger, MD  Admit date: 02/16/2022 Discharge date: 02/21/2022  Admitted From: SNF Discharge disposition: SNF   Recommendations for Outpatient Follow-Up:   CBC 1 week Resume eliquis on Friday (would treat PE for the shortest course possible due to risk of bleeding)   Discharge Diagnosis:   Principal Problem:   Lower GI bleed Active Problems:   Lower GI bleeding    Discharge Condition: Improved.  Diet recommendation: Low sodium, heart healthy.   Wound care: None.  Code status: Full.   History of Present Illness:   [name] is a 81 y.o. 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   Patient started noticed episode of blood in the stool about 6-7 days ago, intermittent, no abdominal pain denies any chest pain lightheadedness shortness of breath.  Increasedly, she noticed persistent rectal bleed, this morning, she had a large episode of rectal bleed with bright red blood, again denies any lightheadedness chest pain or shortness of breath.  No abdominal pain.  Her last dose of Eliquis was yesterday evening.  She called her PCP who directed her to ED. patient reported similar episode in July, when she was hospitalized and bleeding scan negative for active bleed, but patient was on Plavix at that time but not on Eliquis.   Hospital Course by Problem:   Acute on chronic normocytic anemia -Secondary to acute recurrent lower GI bleed, suspicious of recurrent diverticulosis bleeding vs internal hemorrhoid bleeding. -PRBC x2 -Last dose of Eliquis was  10/18 -no further bleeding -GI consultation appreciated: Continue to hold Eliquis for at least 3 more days Avoid ASA/NSAID for at least 7 days If no rebleeding by tomorrow then ok for discharge from GI standpoint.  Outpatient GI follow up with Dr. Havery Moros in 1 month. GI available as needed.      Recently diagnosed PE -Reviewed CT angiogram result, appears to be scattered subsegmental right middle lobe PE with relatively low blood clot burden. -per GI resume eliquis in 3 days-- would treat with shortest course possible- if bleeds again, consider stopping and monitoring off blood thinner -last U/S did not show DVT   HTN -resume meds   CKD stage IV -Euvolemic, creatinine level at baseline, will discontinue hydrochlorothiazide given the low GFR.   Vulvovaginitis -Miconazole cream   RA -DMARDs on hold   CAD, status post CABG -No acute issue, continue Lipitor -resume BB       Medical Consultants:    GI  Discharge Exam:   Vitals:   02/21/22 0836 02/21/22 1115  BP:  (!) 146/59  Pulse:  85  Resp:  18  Temp:  98.2 F (36.8 C)  SpO2: 93% 98%   Vitals:   02/21/22 0406 02/21/22 0743 02/21/22 0836 02/21/22 1115  BP: (!) 144/52 (!) 158/65  (!) 146/59  Pulse: 87 90  85  Resp: '17 18  18  '$ Temp: 98.2 F (36.8 C) 98.2 F (36.8 C)  98.2 F (36.8 C)  TempSrc:  Oral  Oral  SpO2: 93% 95% 93% 98%    General exam: Appears calm and comfortable. Confused at times  The results of significant diagnostics from this hospitalization (including imaging, microbiology, ancillary and laboratory) are listed below for reference.     Procedures and Diagnostic Studies:   DG Chest Portable 1 View  Result Date: 02/16/2022 CLINICAL DATA:  GI bleeding EXAM: PORTABLE CHEST 1 VIEW COMPARISON:  Previous studies including the examination of 02/06/2022 FINDINGS: Transverse diameter of heart is slightly increased. There is previous coronary bypass surgery. There are no signs of pulmonary  edema. There is blunting of lateral CP angles. Small linear densities are seen in right lower lung field. There is no pneumothorax. There is previous surgical fusion in cervical spine. IMPRESSION: Minimal bilateral pleural effusions. Linear densities in right lower lung fields suggest subsegmental atelectasis. Electronically Signed   By: Elmer Picker M.D.   On: 02/16/2022 14:12     Labs:   Basic Metabolic Panel: Recent Labs  Lab 02/16/22 1205 02/17/22 0326 02/18/22 0303 02/19/22 0245  NA 137 136 135 135  K 4.8 4.0 3.6 3.7  CL 104 104 105 105  CO2 22 21* 21* 21*  GLUCOSE 129* 120* 104* 105*  BUN 43* 35* 31* 25*  CREATININE 2.22* 2.03* 1.79* 1.85*  CALCIUM 8.8* 9.1 9.0 9.3   GFR Estimated Creatinine Clearance: 21.9 mL/min (A) (by C-G formula based on SCr of 1.85 mg/dL (H)). Liver Function Tests: Recent Labs  Lab 02/16/22 1205  AST 25  ALT 19  ALKPHOS 74  BILITOT 0.3  PROT 4.8*  ALBUMIN 2.3*   No results for input(s): "LIPASE", "AMYLASE" in the last 168 hours. No results for input(s): "AMMONIA" in the last 168 hours. Coagulation profile Recent Labs  Lab 02/16/22 1205  INR 1.3*    CBC: Recent Labs  Lab 02/16/22 1205 02/16/22 1706 02/17/22 0326 02/18/22 0303 02/18/22 1536 02/19/22 0245 02/20/22 0236 02/20/22 0922 02/21/22 0737  WBC 7.6  --  10.2 9.9  --  9.6 9.7  --  9.8  NEUTROABS 3.6  --   --   --   --   --   --   --   --   HGB 6.1*   < > 9.6* 7.9* 8.7* 8.0* 7.4* 7.8* 9.3*  HCT 18.1*   < > 28.7* 22.3* 26.4* 23.6* 21.5* 23.3* 26.9*  MCV 97.3  --  92.0 90.3  --  92.9 92.7  --  88.5  PLT 460*  --  431* 362  --  355 343  --  359   < > = values in this interval not displayed.   Cardiac Enzymes: No results for input(s): "CKTOTAL", "CKMB", "CKMBINDEX", "TROPONINI" in the last 168 hours. BNP: Invalid input(s): "POCBNP" CBG: No results for input(s): "GLUCAP" in the last 168 hours. D-Dimer No results for input(s): "DDIMER" in the last 72 hours. Hgb  A1c No results for input(s): "HGBA1C" in the last 72 hours. Lipid Profile No results for input(s): "CHOL", "HDL", "LDLCALC", "TRIG", "CHOLHDL", "LDLDIRECT" in the last 72 hours. Thyroid function studies No results for input(s): "TSH", "T4TOTAL", "T3FREE", "THYROIDAB" in the last 72 hours.  Invalid input(s): "FREET3" Anemia work up No results for input(s): "VITAMINB12", "FOLATE", "FERRITIN", "TIBC", "IRON", "RETICCTPCT" in the last 72 hours. Microbiology No results found for this or any previous visit (from the past 240 hour(s)).   Discharge Instructions:   Discharge Instructions     Diet - low sodium heart healthy   Complete by: As directed    Increase activity slowly   Complete by: As directed    No wound care  Complete by: As directed       Allergies as of 02/21/2022       Reactions   Nsaids Other (See Comments)   Stomach upset Told to avoid due to Eliquis   Aspirin Other (See Comments)   Stomach upset Told to avoid due to Eliquis   Codeine Nausea And Vomiting   Nitrostat [nitroglycerin] Other (See Comments)   Bradycardia         Medication List     STOP taking these medications    hydrochlorothiazide 12.5 MG tablet Commonly known as: HYDRODIURIL       TAKE these medications    acetaminophen 325 MG tablet Commonly known as: TYLENOL Take 650 mg by mouth every 6 (six) hours as needed (generalized pain).   albuterol 108 (90 Base) MCG/ACT inhaler Commonly known as: VENTOLIN HFA Inhale 1-2 puffs into the lungs every 4 (four) hours as needed for wheezing or shortness of breath.   amLODipine 2.5 MG tablet Commonly known as: NORVASC Take 1 tablet (2.5 mg total) by mouth daily.   apixaban 5 MG Tabs tablet Commonly known as: Eliquis Take 1 tablet (5 mg total) by mouth 2 (two) times daily. Start taking on: February 24, 2022 What changed: These instructions start on February 24, 2022. If you are unsure what to do until then, ask your doctor or other care  provider.   ascorbic acid 500 MG tablet Commonly known as: VITAMIN C Take 500 mg by mouth daily.   atorvastatin 80 MG tablet Commonly known as: LIPITOR Take 80 mg by mouth daily.   CENTRUM ADULT PO Take 1 tablet by mouth daily. 1 Tablet Daily.   Cyanocobalamin 2500 MCG Tabs Take 2,500 mcg by mouth daily.   famotidine 20 MG tablet Commonly known as: PEPCID Take 20 mg by mouth 2 (two) times daily.   gabapentin 300 MG capsule Commonly known as: NEURONTIN Take 1 capsule (300 mg total) by mouth at bedtime.   Golimumab 50 MG/0.5ML Soaj Take 50 mg by mouth every 2 (two) months. Hold for the next 2 weeks per Orthopedic surgery recommendations.   hydrocortisone 2.5 % rectal cream Commonly known as: ANUSOL-HC Place rectally 2 (two) times daily. What changed: how much to take   leflunomide 20 MG tablet Commonly known as: ARAVA Take 20 mg by mouth daily.   levothyroxine 88 MCG tablet Commonly known as: SYNTHROID TAKE 1 TABLET (88 MCG TOTAL) BY MOUTH DAILY BEFORE BREAKFAST.   metoprolol tartrate 50 MG tablet Commonly known as: LOPRESSOR Take 50 mg by mouth 2 (two) times daily.   pantoprazole 40 MG tablet Commonly known as: PROTONIX Take 40 mg by mouth daily.   sennosides-docusate sodium 8.6-50 MG tablet Commonly known as: SENOKOT-S Take 2 tablets by mouth 2 (two) times daily.   Stiolto Respimat 2.5-2.5 MCG/ACT Aers Generic drug: Tiotropium Bromide-Olodaterol Inhale 2 each into the lungs at bedtime.   tamsulosin 0.4 MG Caps capsule Commonly known as: FLOMAX Take 1 capsule (0.4 mg total) by mouth daily.   triamcinolone 55 MCG/ACT Aero nasal inhaler Commonly known as: NASACORT Place 1 spray into the nose daily.   Vitamin D3 50 MCG (2000 UT) capsule Take 1 capsule (2,000 Units total) by mouth daily.          Time coordinating discharge: 45 min  Signed:  Geradine Girt DO  [facility] 02/21/2022, 12:31 PM

## 2022-02-21 NOTE — TOC Initial Note (Addendum)
Transition of Care [facility]) - Initial/Assessment Note    Patient Details  Name: [name] MRN: [id number] Date of Birth: 02/09/1941  Transition of Care [facility]) CM/SW Contact:    Joanne Chars, LCSW Phone Number: 02/21/2022, 10:12 AM  Clinical Narrative:    CSW met with pt regarding DC recommendation for SNF.  Pt is from independent living at [facility] but was admitted from the rehab unit at [facility], where she was discharged to on 10/14.  Pt confirms that she does want to return to rehab at [facility].  Permission given to speak with [name].    CSW LM with Katie/[facility]     Auth request submitted in [place].      1155: Auth still pending in [place].  CSW spoke with [facility] and she can take pt back today.   1445: Auth still pending in navi.       Expected Discharge Plan: Skilled Nursing Facility Barriers to Discharge: Other (must enter comment) (insurance auth)   Patient Goals and CMS Choice Patient states their goals for this hospitalization and ongoing recovery are:: get back to independent living   Choice offered to / list presented to : Patient (pt from [facility], wants to return.)  Expected Discharge Plan and Services Expected Discharge Plan: [place] In-house Referral: Clinical Social Work   Post Acute Care Choice: [place] Living arrangements for the past 2 months: [place]                                      Prior Living Arrangements/Services Living arrangements for the past 2 months: [place] Lives with:: Spouse Patient language and need for interpreter reviewed:: Yes Do you feel safe going back to the place where you live?: Yes      Need for Family Participation in Patient Care: Yes (Comment) Care giver support system in place?: Yes (comment) Current home services: Other (comment) (na) Criminal Activity/Legal  Involvement Pertinent to Current Situation/Hospitalization: No - Comment as needed  Activities of Daily Living      Permission Sought/Granted Permission sought to share information with : Family Supports Permission granted to share information with : Yes, Verbal Permission Granted  Share Information with NAME: husband harvey  Permission granted to share info w AGENCY: SNF        Emotional Assessment Appearance:: Appears stated age Attitude/Demeanor/Rapport: Engaged Affect (typically observed): Appropriate, Pleasant Orientation: : Oriented to Self, Oriented to Place, Oriented to  Time, Oriented to Situation      Admission diagnosis:  Acute GI bleeding [K92.2] Lower GI bleed [K92.2] Anticoagulated [Z79.01] Symptomatic anemia [D64.9] Patient Active Problem List   Diagnosis Date Noted   Lower GI bleed 02/16/2022   Tachycardia 02/06/2022   Acute pulmonary embolism ([place]) 02/06/2022   Urinary retention 01/30/2022   DVT (deep venous thrombosis) ([place]) 01/30/2022   Vitamin D deficiency 01/30/2022   Slow transit constipation 01/30/2022   GERD (gastroesophageal reflux disease) 01/30/2022   Status post total replacement of right hip 01/29/2022   Status post total replacement of left hip 01/23/2022   Lower GI bleeding 11/18/2021   Hematochezia    Acute blood loss anemia (ABLA)    BRBPR (bright red blood per rectum) 11/17/2021   CKD (chronic kidney disease) stage 4, GFR 15-29 ml/min ([place]) 11/17/2021   Primary osteoarthritis of left hip  10/06/2021   Left hip pain 12/09/2020   Aortic atherosclerosis ([place]) 09/07/2020   Gait disorder 06/08/2020   Neoplasm of uncertain behavior of skin 03/14/2020   Contusion of face 03/10/2020   Arthralgia 12/25/2019   Ankle swelling 08/08/2019   Hyperkalemia 05/15/2019   Osteoporosis 02/26/2018   Colon polyp 06/28/2016   COPD GOLD I 05/28/2016   Upper airway cough syndrome 05/22/2016   Wrist pain, right 12/14/2015   Lung mass 03/02/2015    Pre-operative cardiovascular examination 02/21/2015   DOE (dyspnea on exertion) 02/16/2015   TIA (transient ischemic attack) 12/31/2014   Abnormal mental state 12/30/2014   Diarrhea 11/26/2014   Well adult exam 11/26/2014   Seasonal allergies 12/31/2013   Splenic artery aneurysm ([place]) 04/07/2013   Cerumen impaction 03/20/2013   Dyslipidemia, goal LDL below 70    Mesenteric artery stenosis ([place]) 09/04/2012   Long term (current) use of anticoagulants 11/08/2010   Rheumatoid arthritis ([place]) 06/23/2010   Pain in joint 11/26/2009   Hypothyroidism 12/08/2006   Macular degeneration (senile) of retina 12/08/2006   Essential hypertension 12/08/2006   Diaphragmatic hernia 12/08/2006   ASPLENIA 12/08/2006   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) 12/22/1994    Class: History of   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; now atretic and significantly improved LAD lesion without intervention 12/22/1994    Class: History of   PCP:  Plotnikov, [name], MD Pharmacy:   [id number] [id number]: [phone]: 3[place]Phone: [phone]: [phone] [facility] [address]Phone: [phone]: [phone]    Social Determinants of Health (SDOH) Interventions    Readmission Risk Interventions     No data to display

## 2022-02-21 NOTE — Progress Notes (Signed)
Mobility Specialist Progress Note   02/21/22 1140  Mobility  Activity Ambulated with assistance in hallway  Level of Assistance Standby assist, set-up cues, supervision of patient - no hands on  Assistive Device Four wheel walker  Distance Ambulated (ft) 550 ft (310 + 240)  LLE Weight Bearing WBAT  Activity Response Tolerated well  $Mobility charge 1 Mobility   During Mobility: 88 HR, 93% SpO2  Received in bed having no complaints and agreeable to mobility. X1 seated rest break d/t fatigue and SOB but SpO2 was ranging 93% - 97% on portable pulse ox. Returned back to room qwithout fault. Call placed in reach, all needs were met.  Holland Falling Mobility Specialist Acute Rehab Office:  [phone]

## 2022-02-22 NOTE — Progress Notes (Signed)
Mobility Specialist Progress Note   02/22/22 1050  Mobility  Activity Ambulated with assistance in hallway  Level of Assistance Standby assist, set-up cues, supervision of patient - no hands on  Assistive Device Four wheel walker  Distance Ambulated (ft) 340 ft  Activity Response Tolerated well  $Mobility charge 1 Mobility   Patient received in chair agreeable to participate in mobility. Ambulated in hallway independently with steady gait but requiring x1 seated rest break d/t SOB, SpO2 88% - 96% throughout ambulation.  Returned to room without complaint or incident. Was left in chair with all needs met, call bell in reach.   Holland Falling Mobility Specialist Acute Rehab Office:  [phone]

## 2022-02-22 NOTE — TOC Transition Note (Signed)
Transition of Care [facility]) - CM/SW Discharge Note   Patient Details  Name: [name] MRN: [id number] Date of Birth: 1940/09/05  Transition [facility]) CM/SW Contact:  Joanne Chars, LCSW Phone Number: 02/22/2022, 3:42 PM   Clinical Narrative:   Pt discharging to independent living apartment at [facility], where she will receive PT through that facility.      Final next level of care: Home/Self Care Barriers to Discharge: Barriers Resolved   Patient Goals and CMS Choice Patient states their goals for this hospitalization and ongoing recovery are:: get back to independent living   Choice offered to / list presented to : Patient (pt from [facility], wants to return.)  Discharge Placement                Patient to be transferred to facility by: husband, soun Name of family member notified: husband and son both in room Patient and family notified of of transfer: 02/22/22  Discharge Plan and Services In-house Referral: Clinical Social Work   Post Acute Care Choice: [place]: PT [facility] Agency: Other - See comment (therapy provided by [facility]) Date HH Agency Contacted: 02/22/22 Time Yell: 1530 Representative spoke with at [place]: [place] ([place]) Interventions     Readmission Risk Interventions     No data to display

## 2022-02-22 NOTE — Consult Note (Signed)
   [facility] Inpatient Consult   02/22/2022  KYNNADI DICENSO 1941/04/09 [id number]  [place] Organization [ACO] Patient: [name] PPO  Primary Care Provider:  Cassandria Anger, MD, [place] at [facility] is listed to provide the transition of care follow up for post hospital transition  Patient screened for less than 7 days readmission hospitalization with noted high risk score for unplanned readmission risk and to assess for potential [place] Management service needs for post hospital transition.  Review of patient's medical record reveals patient is for home at [facility] and to return to [place].  47 Met with patient and family as preparing for transitioning home today. Explained this writer's role in post hospital follow up and to anticipate a transition of care call for follow up.  Patient's concern today was regarding the need for compression hose.   Plan:  Will have the team to follow up regarding the needs for compression hose due to leg swelling post op a few weeks ago.  For questions contact:   Natividad Brood, RN BSN [place]  [phone] business mobile phone Toll free office [phone]  *[place]  [phone] Fax number: [phone] [email or web address]

## 2022-02-22 NOTE — Discharge Summary (Addendum)
This is addendum to discharge summary done by Dr. Rudean Curt from 10/24.  See details of discharge summary from 10/24.   Patient seen today, denies any complaints Vitals are stable Vitals:   02/22/22 0923 02/22/22 0947  BP:  (!) 153/65  Pulse: 92 93  Resp: 18   Temp:    SpO2: 94%     Patient to [facility] and will receive PT through their facility.

## 2022-02-22 NOTE — Plan of Care (Signed)
Patient is stable. Patient is being transported to [facility] by private vehicle with family. Education was given to the patient. No questions or concerns voiced.   Problem: Education: Goal: Knowledge of the prescribed therapeutic regimen will improve Outcome: Adequate for Discharge Goal: Understanding of discharge needs will improve Outcome: Adequate for Discharge Goal: Individualized Educational Video(s) Outcome: Adequate for Discharge   Problem: Activity: Goal: Ability to avoid complications of mobility impairment will improve Outcome: Adequate for Discharge Goal: Ability to tolerate increased activity will improve Outcome: Adequate for Discharge   Problem: Clinical Measurements: Goal: Postoperative complications will be avoided or minimized Outcome: Adequate for Discharge   Problem: Pain Management: Goal: Pain level will decrease with appropriate interventions Outcome: Adequate for Discharge   Problem: Skin Integrity: Goal: Will show signs of wound healing Outcome: Adequate for Discharge   Problem: Education: Goal: Knowledge of General Education information will improve Description: Including pain rating scale, medication(s)/side effects and non-pharmacologic comfort measures Outcome: Adequate for Discharge   Problem: Health Behavior/Discharge Planning: Goal: Ability to manage health-related needs will improve Outcome: Adequate for Discharge   Problem: Clinical Measurements: Goal: Ability to maintain clinical measurements within normal limits will improve Outcome: Adequate for Discharge Goal: Will remain free from infection Outcome: Adequate for Discharge Goal: Diagnostic test results will improve Outcome: Adequate for Discharge Goal: Respiratory complications will improve Outcome: Adequate for Discharge Goal: Cardiovascular complication will be avoided Outcome: Adequate for Discharge   Problem: Activity: Goal: Risk for activity intolerance will  decrease Outcome: Adequate for Discharge   Problem: Nutrition: Goal: Adequate nutrition will be maintained Outcome: Adequate for Discharge   Problem: Coping: Goal: Level of anxiety will decrease Outcome: Adequate for Discharge   Problem: Elimination: Goal: Will not experience complications related to bowel motility Outcome: Adequate for Discharge Goal: Will not experience complications related to urinary retention Outcome: Adequate for Discharge   Problem: Pain Managment: Goal: General experience of comfort will improve Outcome: Adequate for Discharge   Problem: Safety: Goal: Ability to remain free from injury will improve Outcome: Adequate for Discharge   Problem: Skin Integrity: Goal: Risk for impaired skin integrity will decrease Outcome: Adequate for Discharge   Problem: Education: Goal: Knowledge of General Education information will improve Description: Including pain rating scale, medication(s)/side effects and non-pharmacologic comfort measures Outcome: Adequate for Discharge   Problem: Health Behavior/Discharge Planning: Goal: Ability to manage health-related needs will improve Outcome: Adequate for Discharge   Problem: Clinical Measurements: Goal: Ability to maintain clinical measurements within normal limits will improve Outcome: Adequate for Discharge Goal: Will remain free from infection Outcome: Adequate for Discharge Goal: Diagnostic test results will improve Outcome: Adequate for Discharge Goal: Respiratory complications will improve Outcome: Adequate for Discharge Goal: Cardiovascular complication will be avoided Outcome: Adequate for Discharge   Problem: Activity: Goal: Risk for activity intolerance will decrease Outcome: Adequate for Discharge   Problem: Nutrition: Goal: Adequate nutrition will be maintained Outcome: Adequate for Discharge   Problem: Coping: Goal: Level of anxiety will decrease Outcome: Adequate for Discharge   Problem:  Elimination: Goal: Will not experience complications related to bowel motility Outcome: Adequate for Discharge Goal: Will not experience complications related to urinary retention Outcome: Adequate for Discharge   Problem: Pain Managment: Goal: General experience of comfort will improve Outcome: Adequate for Discharge   Problem: Safety: Goal: Ability to remain free from injury will improve Outcome: Adequate for Discharge   Problem: Skin Integrity: Goal: Risk for impaired skin integrity will decrease Outcome: Adequate for Discharge

## 2022-02-22 NOTE — TOC Progression Note (Addendum)
Transition of Care [facility]) - Progression Note    Patient Details  Name: [name] MRN: [id number] Date of Birth: May 04, 1940  Transition [facility]) CM/SW Contact  Joanne Chars, LCSW Phone Number: 02/22/2022, 11:49 AM  Clinical Narrative:   Insurance auth still pending in [place].  1400: auth still pending in [place].  1500: RN asked CSW to speak with pt, husband, son.  Updated them that Josem Kaufmann is still pending, that this is usually not a good sign for approval.  Discussed if they wanted to start looking at other options.  They will call Katie at [facility] to discuss options/costs.  TC Katie.  She has talked with them about private pay for rehab vs ALF vs back to independent living with therapy there.    CSW returned to room, Katie on speakerphone.After discussion, they have decided to return to their independent living apartment and have PT there.  Son is in town through Friday, husband also there full time.  Katie will alert the PT team at [facility].    Expected Discharge Plan: [place] Barriers to Discharge: Other (must enter comment) (insurance auth)  Expected Discharge Plan and Services Expected Discharge Plan: [place] In-house Referral: Clinical Social Work   Post Acute Care Choice: [place] Living arrangements for the past 2 months: [place] Expected Discharge Date: 02/21/22                                     Social Determinants of Health (SDOH) Interventions    Readmission Risk Interventions     No data to display

## 2022-02-23 ENCOUNTER — Telehealth: Payer: Self-pay | Admitting: *Deleted

## 2022-02-23 NOTE — Chronic Care Management (AMB) (Signed)
  Care Coordination  Outreach Note  02/23/2022 Name: DEANETTE TULLIUS MRN: [id number] DOB: March 02, 1941   Care Coordination Outreach Attempts: An unsuccessful telephone outreach was attempted today to offer the patient information about available care coordination services as a benefit of their health plan.   Referral received   Follow Up Plan:  Additional outreach attempts will be made to offer the patient care coordination information and services.   Encounter Outcome:  No Answer  Julian Hy, [place] Direct Dial: [phone]

## 2022-02-24 ENCOUNTER — Telehealth: Payer: Self-pay | Admitting: Internal Medicine

## 2022-02-24 NOTE — Telephone Encounter (Signed)
Chirs from [facility] is calling in asking if we received the fax or not.

## 2022-02-24 NOTE — Telephone Encounter (Signed)
Gerald Stabs at [facility] called to request ORDERS for PT, OT, Speech.  Joylene Igo [phone] [place] states she will also fax orders to provider at fax [phone] for him to sign if that makes it easier for provider.

## 2022-02-27 DIAGNOSIS — N189 Chronic kidney disease, unspecified: Secondary | ICD-10-CM | POA: Diagnosis not present

## 2022-02-27 DIAGNOSIS — N184 Chronic kidney disease, stage 4 (severe): Secondary | ICD-10-CM | POA: Diagnosis not present

## 2022-02-27 NOTE — Chronic Care Management (AMB) (Signed)
  Care Coordination   Note   02/27/2022 Name: NELY DEDMON MRN: [id number] DOB: [birth year]  MACKENSI MAHADEO is a 81 y.o. year old female who sees Plotnikov, [name], MD for primary care. I reached out to Barbaraann Boys by phone today to offer care coordination services.  Ms. Tanney was given information about Care Coordination services today including:   The Care Coordination services include support from the care team which includes your Nurse Coordinator, Clinical Social Worker, or Pharmacist.  The Care Coordination team is here to help remove barriers to the health concerns and goals most important to you. Care Coordination services are voluntary, and the patient may decline or stop services at any time by request to their care team member.   Care Coordination Consent Status: Patient agreed to services and verbal consent obtained.   Follow up plan:  Telephone appointment with care coordination team member scheduled for:  03/08/2022  Encounter Outcome:  Pt. Scheduled from referral   Julian Hy, [place] Direct Dial: [phone]

## 2022-02-27 NOTE — Telephone Encounter (Signed)
Rec'd fax on Friday MD was not in the office. On his desk to be sign.Marland KitchenJohny Chess

## 2022-02-28 NOTE — Telephone Encounter (Signed)
Okay. Thank you.

## 2022-03-01 ENCOUNTER — Ambulatory Visit: Payer: Medicare PPO | Admitting: Internal Medicine

## 2022-03-01 ENCOUNTER — Encounter: Payer: Self-pay | Admitting: Internal Medicine

## 2022-03-01 VITALS — BP 132/58 | HR 80 | Temp 98.0°F | Ht 63.0 in | Wt 165.8 lb

## 2022-03-01 DIAGNOSIS — M25473 Effusion, unspecified ankle: Secondary | ICD-10-CM

## 2022-03-01 DIAGNOSIS — D649 Anemia, unspecified: Secondary | ICD-10-CM

## 2022-03-01 DIAGNOSIS — M1612 Unilateral primary osteoarthritis, left hip: Secondary | ICD-10-CM | POA: Diagnosis not present

## 2022-03-01 DIAGNOSIS — E039 Hypothyroidism, unspecified: Secondary | ICD-10-CM | POA: Diagnosis not present

## 2022-03-01 DIAGNOSIS — R278 Other lack of coordination: Secondary | ICD-10-CM | POA: Diagnosis not present

## 2022-03-01 DIAGNOSIS — M6281 Muscle weakness (generalized): Secondary | ICD-10-CM | POA: Diagnosis not present

## 2022-03-01 DIAGNOSIS — R41841 Cognitive communication deficit: Secondary | ICD-10-CM | POA: Diagnosis not present

## 2022-03-01 DIAGNOSIS — R0609 Other forms of dyspnea: Secondary | ICD-10-CM

## 2022-03-01 DIAGNOSIS — I2699 Other pulmonary embolism without acute cor pulmonale: Secondary | ICD-10-CM | POA: Diagnosis not present

## 2022-03-01 MED ORDER — DOCUSATE SODIUM 100 MG PO CAPS: 100.0000 mg | ORAL_CAPSULE | Freq: Every day | ORAL | 3 refills | Status: DC | PRN

## 2022-03-01 MED ORDER — TORSEMIDE 20 MG PO TABS: 20.0000 mg | ORAL_TABLET | Freq: Every day | ORAL | 1 refills | Status: DC | PRN

## 2022-03-01 MED ORDER — APIXABAN 2.5 MG PO TABS: 2.5000 mg | ORAL_TABLET | Freq: Two times a day (BID) | ORAL | 5 refills | Status: DC

## 2022-03-01 NOTE — Progress Notes (Signed)
Subjective:  Patient ID: [name], female    DOB: [birth year]  Age: 81 y.o. MRN: [id number]  CC: Follow-up [facility] f/u- Pt states she is out of the Eliquis & Tamulosin need to know if she need to continue if so will need rx)   HPI [name] presents for a post-hosp f/u. C/o B ankle and leg swelling L>R, weakness; recent GI bleed Pt gained 6 lbs  Per hx:  "Admit date: 02/16/2022 Discharge date: 02/21/2022   Admitted From: SNF Discharge disposition: SNF     Recommendations for Outpatient Follow-Up:    CBC 1 week Resume eliquis on Friday (would treat PE for the shortest course possible due to risk of bleeding)     Discharge Diagnosis:    Principal Problem:   Lower GI bleed Active Problems:   Lower GI bleeding       Discharge Condition: Improved.   Diet recommendation: Low sodium, heart healthy.    Wound care: None.   Code status: Full.     History of Present Illness:    [name] is a 81 y.o. 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   Patient started noticed episode of blood in the stool about 6-7 days ago, intermittent, no abdominal pain denies any chest pain lightheadedness shortness of breath.  Increasedly, she noticed persistent rectal bleed, this morning, she had a large episode of rectal bleed with bright red blood, again denies any lightheadedness chest pain or shortness of breath.  No abdominal pain.  Her last dose of Eliquis was yesterday evening.  She called her PCP who directed her to ED. patient reported similar episode in July, when she was hospitalized and bleeding scan negative for active bleed, but patient was on Plavix at that  time but not on Eliquis.     Hospital Course by Problem:    Acute on chronic normocytic anemia -Secondary to acute recurrent lower GI bleed, suspicious of recurrent diverticulosis bleeding vs internal hemorrhoid bleeding. -PRBC x2 -Last dose of Eliquis was 10/18 -no further bleeding -GI consultation appreciated: Continue to hold Eliquis for at least 3 more days Avoid ASA/NSAID for at least 7 days If no rebleeding by tomorrow then ok for discharge from GI standpoint.  Outpatient GI follow up with Dr. Havery Moros in 1 month. GI available as needed.       Recently diagnosed PE -Reviewed CT angiogram result, appears to be scattered subsegmental right middle lobe PE with relatively low blood clot burden. -per GI resume eliquis in 3 days-- would treat with shortest course possible- if bleeds again, consider stopping and monitoring off blood thinner -last U/S did not show DVT   HTN -resume meds   CKD stage IV -Euvolemic, creatinine level at baseline, will discontinue hydrochlorothiazide given the low GFR.   Vulvovaginitis -Miconazole cream   RA -DMARDs on hold   CAD, status post CABG -No acute issue, continue Lipitor -resume BB:"    Outpatient Medications Prior to Visit  Medication Sig Dispense Refill   acetaminophen (TYLENOL) 325 MG tablet Take 650 mg by mouth every 6 (six) hours as needed (generalized pain).     albuterol (VENTOLIN HFA) 108 (90 Base) MCG/ACT inhaler Inhale 1-2  puffs into the lungs every 4 (four) hours as needed for wheezing or shortness of breath.     amLODipine (NORVASC) 2.5 MG tablet Take 1 tablet (2.5 mg total) by mouth daily. 90 tablet 2   ascorbic acid (VITAMIN C) 500 MG tablet Take 500 mg by mouth daily.     atorvastatin (LIPITOR) 80 MG tablet Take 80 mg by mouth daily.     Cholecalciferol (VITAMIN D3) 50 MCG (2000 UT) capsule Take 1 capsule (2,000 Units total) by mouth daily. 100 capsule 3   Cyanocobalamin 2500 MCG TABS Take 2,500 mcg by mouth daily.      famotidine (PEPCID) 20 MG tablet Take 20 mg by mouth 2 (two) times daily.     gabapentin (NEURONTIN) 300 MG capsule Take 1 capsule (300 mg total) by mouth at bedtime.     Golimumab 50 MG/0.5ML SOAJ Take 50 mg by mouth every 2 (two) months. Hold for the next 2 weeks per Orthopedic surgery recommendations. 0.3 mL    hydrocortisone (ANUSOL-HC) 2.5 % rectal cream Place rectally 2 (two) times daily. (Patient taking differently: Place 1 Application rectally 2 (two) times daily.) 30 g 0   leflunomide (ARAVA) 20 MG tablet Take 20 mg by mouth daily.      levothyroxine (SYNTHROID) 88 MCG tablet TAKE 1 TABLET (88 MCG TOTAL) BY MOUTH DAILY BEFORE BREAKFAST. 90 tablet 3   metoprolol tartrate (LOPRESSOR) 50 MG tablet Take 50 mg by mouth 2 (two) times daily.     Multiple Vitamins-Minerals (CENTRUM ADULT PO) Take 1 tablet by mouth daily. 1 Tablet Daily.     pantoprazole (PROTONIX) 40 MG tablet Take 40 mg by mouth daily.     Tiotropium Bromide-Olodaterol (STIOLTO RESPIMAT) 2.5-2.5 MCG/ACT AERS Inhale 2 each into the lungs at bedtime.     triamcinolone (NASACORT) 55 MCG/ACT AERO nasal inhaler Place 1 spray into the nose daily.     apixaban (ELIQUIS) 5 MG TABS tablet Take 1 tablet (5 mg total) by mouth 2 (two) times daily. 60 tablet    tamsulosin (FLOMAX) 0.4 MG CAPS capsule Take 1 capsule (0.4 mg total) by mouth daily.     sennosides-docusate sodium (SENOKOT-S) 8.6-50 MG tablet Take 2 tablets by mouth 2 (two) times daily. (Patient not taking: Reported on 03/01/2022)     No facility-administered medications prior to visit.    ROS: Review of Systems  Constitutional:  Positive for fatigue. Negative for activity change, appetite change, chills and unexpected weight change.  HENT:  Negative for congestion, mouth sores and sinus pressure.   Eyes:  Negative for visual disturbance.  Respiratory:  Positive for shortness of breath. Negative for cough and chest tightness.   Cardiovascular:  Positive for leg swelling.   Gastrointestinal:  Positive for constipation. Negative for abdominal pain and nausea.  Genitourinary:  Negative for difficulty urinating, frequency and vaginal pain.  Musculoskeletal:  Positive for arthralgias, back pain, gait problem and neck stiffness.  Skin:  Negative for pallor and rash.  Neurological:  Positive for weakness. Negative for dizziness, tremors, numbness and headaches.  Psychiatric/Behavioral:  Negative for confusion, sleep disturbance and suicidal ideas. The patient is not nervous/anxious.     Objective:  BP (!) 132/58 (BP Location: Left Arm)   Pulse 80   Temp 98 F (36.7 C) (Oral)   Ht '5\' 3"'$  (1.6 m)   Wt 165 lb 12.8 oz (75.2 kg)   SpO2 95%   BMI 29.37 kg/m   BP Readings from Last 3 Encounters:  03/01/22 [name])  132/58  02/22/22 (!) 153/65  02/16/22 (!) 155/57    Wt Readings from Last 3 Encounters:  03/01/22 165 lb 12.8 oz (75.2 kg)  02/15/22 159 lb (72.1 kg)  02/15/22 159 lb (72.1 kg)    Physical Exam Constitutional:      General: She is not in acute distress.    Appearance: She is well-developed.  HENT:     Head: Normocephalic.     Right Ear: External ear normal.     Left Ear: External ear normal.     Nose: Nose normal.  Eyes:     General:        Right eye: No discharge.        Left eye: No discharge.     Conjunctiva/sclera: Conjunctivae normal.     Pupils: Pupils are equal, round, and reactive to light.  Neck:     Thyroid: No thyromegaly.     Vascular: No JVD.     Trachea: No tracheal deviation.  Cardiovascular:     Rate and Rhythm: Normal rate and regular rhythm.     Heart sounds: Normal heart sounds.  Pulmonary:     Effort: No respiratory distress.     Breath sounds: No stridor. No wheezing.  Abdominal:     General: Bowel sounds are normal. There is no distension.     Palpations: Abdomen is soft. There is no mass.     Tenderness: There is no abdominal tenderness. There is no guarding or rebound.  Musculoskeletal:        General:  Tenderness present.     Cervical back: Normal range of motion and neck supple. No rigidity.     Right lower leg: Edema present.     Left lower leg: Edema present.  Lymphadenopathy:     Cervical: No cervical adenopathy.  Skin:    Findings: No erythema or rash.  Neurological:     Cranial Nerves: No cranial nerve deficit.     Motor: No abnormal muscle tone.     Coordination: Coordination normal.     Gait: Gait abnormal.     Deep Tendon Reflexes: Reflexes normal.  Psychiatric:        Behavior: Behavior normal.        Thought Content: Thought content normal.        Judgment: Judgment normal.   The patient is in a wheelchair.  Leg edema is 1+ up to the knees Breath sounds are decreased at bases.  Lab Results  Component Value Date   WBC 9.8 02/21/2022   HGB 9.3 (L) 02/21/2022   HCT 26.9 (L) 02/21/2022   PLT 359 02/21/2022   GLUCOSE 105 (H) 02/19/2022   CHOL 157 09/07/2020   TRIG 121.0 09/07/2020   HDL 59.30 09/07/2020   LDLDIRECT 162.1 10/28/2009   LDLCALC 74 09/07/2020   ALT 19 02/16/2022   AST 25 02/16/2022   NA 135 02/19/2022   K 3.7 02/19/2022   CL 105 02/19/2022   CREATININE 1.85 (H) 02/19/2022   BUN 25 (H) 02/19/2022   CO2 21 (L) 02/19/2022   TSH 0.985 02/16/2022   INR 1.3 (H) 02/16/2022    DG Chest Portable 1 View  Result Date: 02/16/2022 CLINICAL DATA:  GI bleeding EXAM: PORTABLE CHEST 1 VIEW COMPARISON:  Previous studies including the examination of 02/06/2022 FINDINGS: Transverse diameter of heart is slightly increased. There is previous coronary bypass surgery. There are no signs of pulmonary edema. There is blunting of lateral CP angles. Small linear densities are seen  in right lower lung field. There is no pneumothorax. There is previous surgical fusion in cervical spine. IMPRESSION: Minimal bilateral pleural effusions. Linear densities in right lower lung fields suggest subsegmental atelectasis. Electronically Signed   By: Elmer Picker M.D.   On:  02/16/2022 14:12    Assessment & Plan:   Problem List Items Addressed This Visit     Ankle swelling    Likely related to CHF/fluid retention.  Will use torsemide.  Obtain labs-monitor/GFR      DOE (dyspnea on exertion)    Likely related to CHF/fluid retention.  Will use torsemide.  Obtain labs-monitor/GFR      Hypothyroidism    Levothroid.  Check TSH      Pulmonary emboli (HCC)     PE/DVT post-op. Lower GI bleed Treat edema Start Eliquis at the reduced dose due to Creat>1.5 and age >27      Relevant Medications   torsemide (DEMADEX) 20 MG tablet   apixaban (ELIQUIS) 2.5 MG TABS tablet   Other Relevant Orders   CBC with Differential/Platelet   Comprehensive metabolic panel   Iron, TIBC and Ferritin Panel   Other Visit Diagnoses     Anemia, unspecified type    -  Primary   Relevant Orders   CBC with Differential/Platelet   Iron, TIBC and Ferritin Panel         Meds ordered this encounter  Medications   torsemide (DEMADEX) 20 MG tablet    Sig: Take 1-2 tablets (20-40 mg total) by mouth daily as needed.    Dispense:  60 tablet    Refill:  1   apixaban (ELIQUIS) 2.5 MG TABS tablet    Sig: Take 1 tablet (2.5 mg total) by mouth 2 (two) times daily.    Dispense:  60 tablet    Refill:  5   docusate sodium (COLACE) 100 MG capsule    Sig: Take 1-2 capsules (100-200 mg total) by mouth daily as needed for mild constipation.    Dispense:  100 capsule    Refill:  3      Follow-up: Return in about 4 weeks (around 03/29/2022) for a follow-up visit.  [name], MD

## 2022-03-01 NOTE — Telephone Encounter (Signed)
FAXED BACK TO [facility] ON YESTERDAY 02/28/22.[name]/LMB

## 2022-03-01 NOTE — Assessment & Plan Note (Signed)
PE/DVT post-op. Lower GI bleed Treat edema Start Eliquis at the reduced dose due to Creat>1.5 and age >69

## 2022-03-01 NOTE — Patient Instructions (Signed)
Leg elevation wedge

## 2022-03-02 DIAGNOSIS — R41841 Cognitive communication deficit: Secondary | ICD-10-CM | POA: Diagnosis not present

## 2022-03-02 DIAGNOSIS — R2681 Unsteadiness on feet: Secondary | ICD-10-CM | POA: Diagnosis not present

## 2022-03-02 DIAGNOSIS — Z96642 Presence of left artificial hip joint: Secondary | ICD-10-CM | POA: Diagnosis not present

## 2022-03-02 DIAGNOSIS — R2689 Other abnormalities of gait and mobility: Secondary | ICD-10-CM | POA: Diagnosis not present

## 2022-03-02 DIAGNOSIS — M6281 Muscle weakness (generalized): Secondary | ICD-10-CM | POA: Diagnosis not present

## 2022-03-02 DIAGNOSIS — R278 Other lack of coordination: Secondary | ICD-10-CM | POA: Diagnosis not present

## 2022-03-03 DIAGNOSIS — R2681 Unsteadiness on feet: Secondary | ICD-10-CM | POA: Diagnosis not present

## 2022-03-03 DIAGNOSIS — R41841 Cognitive communication deficit: Secondary | ICD-10-CM | POA: Diagnosis not present

## 2022-03-03 DIAGNOSIS — M6281 Muscle weakness (generalized): Secondary | ICD-10-CM | POA: Diagnosis not present

## 2022-03-03 DIAGNOSIS — R2689 Other abnormalities of gait and mobility: Secondary | ICD-10-CM | POA: Diagnosis not present

## 2022-03-03 DIAGNOSIS — R278 Other lack of coordination: Secondary | ICD-10-CM | POA: Diagnosis not present

## 2022-03-03 DIAGNOSIS — Z96642 Presence of left artificial hip joint: Secondary | ICD-10-CM | POA: Diagnosis not present

## 2022-03-04 NOTE — Assessment & Plan Note (Signed)
Likely related to CHF/fluid retention.  Will use torsemide.  Obtain labs-monitor/GFR

## 2022-03-04 NOTE — Assessment & Plan Note (Signed)
Levothroid.  Check TSH

## 2022-03-05 DIAGNOSIS — R609 Edema, unspecified: Secondary | ICD-10-CM | POA: Diagnosis not present

## 2022-03-05 DIAGNOSIS — M25552 Pain in left hip: Secondary | ICD-10-CM | POA: Diagnosis not present

## 2022-03-06 ENCOUNTER — Emergency Department (HOSPITAL_COMMUNITY): Payer: Medicare PPO

## 2022-03-06 ENCOUNTER — Observation Stay (HOSPITAL_COMMUNITY): Payer: Medicare PPO

## 2022-03-06 ENCOUNTER — Encounter (HOSPITAL_COMMUNITY): Payer: Self-pay

## 2022-03-06 ENCOUNTER — Inpatient Hospital Stay (HOSPITAL_COMMUNITY)
Admission: EM | Admit: 2022-03-06 | Discharge: 2022-03-09 | DRG: 683 | Disposition: A | Discharge status: Skilled Nursing Facility | Payer: Medicare PPO | Attending: Internal Medicine | Admitting: Internal Medicine

## 2022-03-06 ENCOUNTER — Other Ambulatory Visit: Payer: Self-pay

## 2022-03-06 DIAGNOSIS — J432 Centrilobular emphysema: Secondary | ICD-10-CM

## 2022-03-06 DIAGNOSIS — Z7951 Long term (current) use of inhaled steroids: Secondary | ICD-10-CM | POA: Diagnosis not present

## 2022-03-06 DIAGNOSIS — S73005A Unspecified dislocation of left hip, initial encounter: Principal | ICD-10-CM

## 2022-03-06 DIAGNOSIS — D631 Anemia in chronic kidney disease: Secondary | ICD-10-CM

## 2022-03-06 DIAGNOSIS — Z8673 Personal history of transient ischemic attack (TIA), and cerebral infarction without residual deficits: Secondary | ICD-10-CM

## 2022-03-06 DIAGNOSIS — N179 Acute kidney failure, unspecified: Principal | ICD-10-CM | POA: Diagnosis present

## 2022-03-06 DIAGNOSIS — Z823 Family history of stroke: Secondary | ICD-10-CM

## 2022-03-06 DIAGNOSIS — I252 Old myocardial infarction: Secondary | ICD-10-CM

## 2022-03-06 DIAGNOSIS — Z86718 Personal history of other venous thrombosis and embolism: Secondary | ICD-10-CM

## 2022-03-06 DIAGNOSIS — E8729 Other acidosis: Secondary | ICD-10-CM | POA: Diagnosis not present

## 2022-03-06 DIAGNOSIS — R2689 Other abnormalities of gait and mobility: Secondary | ICD-10-CM | POA: Diagnosis not present

## 2022-03-06 DIAGNOSIS — E872 Acidosis, unspecified: Secondary | ICD-10-CM | POA: Diagnosis present

## 2022-03-06 DIAGNOSIS — E876 Hypokalemia: Secondary | ICD-10-CM | POA: Diagnosis present

## 2022-03-06 DIAGNOSIS — M25552 Pain in left hip: Secondary | ICD-10-CM | POA: Diagnosis not present

## 2022-03-06 DIAGNOSIS — N184 Chronic kidney disease, stage 4 (severe): Secondary | ICD-10-CM | POA: Diagnosis present

## 2022-03-06 DIAGNOSIS — Z79890 Hormone replacement therapy (postmenopausal): Secondary | ICD-10-CM

## 2022-03-06 DIAGNOSIS — I714 Abdominal aortic aneurysm, without rupture, unspecified: Secondary | ICD-10-CM | POA: Diagnosis present

## 2022-03-06 DIAGNOSIS — I82421 Acute embolism and thrombosis of right iliac vein: Secondary | ICD-10-CM | POA: Diagnosis not present

## 2022-03-06 DIAGNOSIS — E039 Hypothyroidism, unspecified: Secondary | ICD-10-CM | POA: Diagnosis present

## 2022-03-06 DIAGNOSIS — E669 Obesity, unspecified: Secondary | ICD-10-CM | POA: Diagnosis present

## 2022-03-06 DIAGNOSIS — E782 Mixed hyperlipidemia: Secondary | ICD-10-CM

## 2022-03-06 DIAGNOSIS — T84021A Dislocation of internal left hip prosthesis, initial encounter: Secondary | ICD-10-CM | POA: Diagnosis present

## 2022-03-06 DIAGNOSIS — M25473 Effusion, unspecified ankle: Secondary | ICD-10-CM | POA: Diagnosis not present

## 2022-03-06 DIAGNOSIS — I129 Hypertensive chronic kidney disease with stage 1 through stage 4 chronic kidney disease, or unspecified chronic kidney disease: Secondary | ICD-10-CM | POA: Diagnosis present

## 2022-03-06 DIAGNOSIS — J90 Pleural effusion, not elsewhere classified: Secondary | ICD-10-CM | POA: Diagnosis not present

## 2022-03-06 DIAGNOSIS — Z862 Personal history of diseases of the blood and blood-forming organs and certain disorders involving the immune mechanism: Secondary | ICD-10-CM | POA: Diagnosis not present

## 2022-03-06 DIAGNOSIS — I700 Atherosclerosis of aorta: Secondary | ICD-10-CM | POA: Diagnosis not present

## 2022-03-06 DIAGNOSIS — M353 Polymyalgia rheumatica: Secondary | ICD-10-CM | POA: Diagnosis present

## 2022-03-06 DIAGNOSIS — J449 Chronic obstructive pulmonary disease, unspecified: Secondary | ICD-10-CM | POA: Diagnosis present

## 2022-03-06 DIAGNOSIS — I82409 Acute embolism and thrombosis of unspecified deep veins of unspecified lower extremity: Secondary | ICD-10-CM | POA: Diagnosis not present

## 2022-03-06 DIAGNOSIS — E785 Hyperlipidemia, unspecified: Secondary | ICD-10-CM | POA: Diagnosis present

## 2022-03-06 DIAGNOSIS — N189 Chronic kidney disease, unspecified: Secondary | ICD-10-CM | POA: Diagnosis not present

## 2022-03-06 DIAGNOSIS — Y831 Surgical operation with implant of artificial internal device as the cause of abnormal reaction of the patient, or of later complication, without mention of misadventure at the time of the procedure: Secondary | ICD-10-CM | POA: Diagnosis present

## 2022-03-06 DIAGNOSIS — I10 Essential (primary) hypertension: Secondary | ICD-10-CM | POA: Diagnosis present

## 2022-03-06 DIAGNOSIS — J309 Allergic rhinitis, unspecified: Secondary | ICD-10-CM | POA: Diagnosis present

## 2022-03-06 DIAGNOSIS — Z7901 Long term (current) use of anticoagulants: Secondary | ICD-10-CM

## 2022-03-06 DIAGNOSIS — Z9081 Acquired absence of spleen: Secondary | ICD-10-CM

## 2022-03-06 DIAGNOSIS — E8770 Fluid overload, unspecified: Secondary | ICD-10-CM | POA: Diagnosis present

## 2022-03-06 DIAGNOSIS — Z885 Allergy status to narcotic agent status: Secondary | ICD-10-CM

## 2022-03-06 DIAGNOSIS — S73005D Unspecified dislocation of left hip, subsequent encounter: Secondary | ICD-10-CM | POA: Diagnosis not present

## 2022-03-06 DIAGNOSIS — Z8249 Family history of ischemic heart disease and other diseases of the circulatory system: Secondary | ICD-10-CM

## 2022-03-06 DIAGNOSIS — Z951 Presence of aortocoronary bypass graft: Secondary | ICD-10-CM | POA: Diagnosis not present

## 2022-03-06 DIAGNOSIS — I2510 Atherosclerotic heart disease of native coronary artery without angina pectoris: Secondary | ICD-10-CM | POA: Diagnosis present

## 2022-03-06 DIAGNOSIS — Z6829 Body mass index (BMI) 29.0-29.9, adult: Secondary | ICD-10-CM

## 2022-03-06 DIAGNOSIS — M069 Rheumatoid arthritis, unspecified: Secondary | ICD-10-CM | POA: Diagnosis present

## 2022-03-06 DIAGNOSIS — M47816 Spondylosis without myelopathy or radiculopathy, lumbar region: Secondary | ICD-10-CM | POA: Diagnosis not present

## 2022-03-06 DIAGNOSIS — Z4789 Encounter for other orthopedic aftercare: Secondary | ICD-10-CM | POA: Diagnosis not present

## 2022-03-06 DIAGNOSIS — I2699 Other pulmonary embolism without acute cor pulmonale: Secondary | ICD-10-CM | POA: Diagnosis not present

## 2022-03-06 DIAGNOSIS — Z87891 Personal history of nicotine dependence: Secondary | ICD-10-CM | POA: Diagnosis not present

## 2022-03-06 DIAGNOSIS — Z471 Aftercare following joint replacement surgery: Secondary | ICD-10-CM | POA: Diagnosis not present

## 2022-03-06 DIAGNOSIS — Z79899 Other long term (current) drug therapy: Secondary | ICD-10-CM

## 2022-03-06 DIAGNOSIS — Z86711 Personal history of pulmonary embolism: Secondary | ICD-10-CM

## 2022-03-06 DIAGNOSIS — Z96642 Presence of left artificial hip joint: Secondary | ICD-10-CM | POA: Diagnosis not present

## 2022-03-06 DIAGNOSIS — Z888 Allergy status to other drugs, medicaments and biological substances status: Secondary | ICD-10-CM

## 2022-03-06 DIAGNOSIS — M25572 Pain in left ankle and joints of left foot: Secondary | ICD-10-CM | POA: Diagnosis not present

## 2022-03-06 HISTORY — DX: Personal history of other venous thrombosis and embolism: Z86.718

## 2022-03-06 HISTORY — DX: Acute embolism and thrombosis of unspecified deep veins of unspecified lower extremity: I82.409

## 2022-03-06 HISTORY — DX: Personal history of pulmonary embolism: Z86.711

## 2022-03-06 LAB — URINALYSIS, ROUTINE W REFLEX MICROSCOPIC
Bacteria, UA: NONE SEEN
Bilirubin Urine: NEGATIVE
Glucose, UA: NEGATIVE mg/dL
Ketones, ur: NEGATIVE mg/dL
Leukocytes,Ua: NEGATIVE
Nitrite: NEGATIVE
Protein, ur: 30 mg/dL — AB
Specific Gravity, Urine: 1.005 (ref 1.005–1.030)
pH: 5 (ref 5.0–8.0)

## 2022-03-06 LAB — BASIC METABOLIC PANEL
Anion gap: 17 — ABNORMAL HIGH (ref 5–15)
BUN: 43 mg/dL — ABNORMAL HIGH (ref 8–23)
CO2: 20 mmol/L — ABNORMAL LOW (ref 22–32)
Calcium: 9 mg/dL (ref 8.9–10.3)
Chloride: 100 mmol/L (ref 98–111)
Creatinine, Ser: 3.02 mg/dL — ABNORMAL HIGH (ref 0.44–1.00)
GFR, Estimated: 15 mL/min — ABNORMAL LOW (ref >60)
Glucose, Bld: 116 mg/dL — ABNORMAL HIGH (ref 70–99)
Potassium: 3.5 mmol/L (ref 3.5–5.1)
Sodium: 137 mmol/L (ref 135–145)

## 2022-03-06 LAB — CBC WITH DIFFERENTIAL/PLATELET
Abs Immature Granulocytes: 0.03 10*3/uL (ref 0.00–0.07)
Basophils Absolute: 0.2 10*3/uL — ABNORMAL HIGH (ref 0.0–0.1)
Basophils Relative: 2 %
Eosinophils Absolute: 0.4 10*3/uL (ref 0.0–0.5)
Eosinophils Relative: 5 %
HCT: 29.2 % — ABNORMAL LOW (ref 36.0–46.0)
Hemoglobin: 9.7 g/dL — ABNORMAL LOW (ref 12.0–15.0)
Immature Granulocytes: 0 %
Lymphocytes Relative: 21 %
Lymphs Abs: 1.8 10*3/uL (ref 0.7–4.0)
MCH: 30.3 pg (ref 26.0–34.0)
MCHC: 33.2 g/dL (ref 30.0–36.0)
MCV: 91.3 fL (ref 80.0–100.0)
Monocytes Absolute: 2 10*3/uL — ABNORMAL HIGH (ref 0.1–1.0)
Monocytes Relative: 23 %
Neutro Abs: 4.3 10*3/uL (ref 1.7–7.7)
Neutrophils Relative %: 49 %
Platelets: 378 10*3/uL (ref 150–400)
RBC: 3.2 MIL/uL — ABNORMAL LOW (ref 3.87–5.11)
RDW: 17.5 % — ABNORMAL HIGH (ref 11.5–15.5)
WBC: 8.7 10*3/uL (ref 4.0–10.5)
nRBC: 0.5 % — ABNORMAL HIGH (ref 0.0–0.2)

## 2022-03-06 LAB — CREATININE, URINE, RANDOM: Creatinine, Urine: 24 mg/dL

## 2022-03-06 LAB — SODIUM, URINE, RANDOM: Sodium, Ur: 100 mmol/L

## 2022-03-06 LAB — MAGNESIUM: Magnesium: 1.1 mg/dL — ABNORMAL LOW (ref 1.7–2.4)

## 2022-03-06 MED ORDER — HYDROMORPHONE HCL 1 MG/ML IJ SOLN
1.0000 mg | Freq: Once | INTRAMUSCULAR | Status: AC
  Administered 2022-03-06: 1 mg via INTRAVENOUS
  Filled 2022-03-06: qty 1

## 2022-03-06 MED ORDER — LEVOTHYROXINE SODIUM 88 MCG PO TABS
88.0000 ug | ORAL_TABLET | Freq: Every day | ORAL | Status: DC
  Administered 2022-03-06 – 2022-03-09 (×4): 88 ug via ORAL
  Filled 2022-03-06 (×6): qty 1

## 2022-03-06 MED ORDER — FAMOTIDINE 20 MG PO TABS
20.0000 mg | ORAL_TABLET | Freq: Every day | ORAL | Status: DC
  Administered 2022-03-06 – 2022-03-09 (×4): 20 mg via ORAL
  Filled 2022-03-06 (×4): qty 1

## 2022-03-06 MED ORDER — AMLODIPINE BESYLATE 5 MG PO TABS
2.5000 mg | ORAL_TABLET | Freq: Every day | ORAL | Status: DC
  Administered 2022-03-06: 2.5 mg via ORAL
  Filled 2022-03-06: qty 1

## 2022-03-06 MED ORDER — ACETAMINOPHEN 650 MG RE SUPP: 650.0000 mg | Freq: Four times a day (QID) | RECTAL | Status: DC | PRN

## 2022-03-06 MED ORDER — ATORVASTATIN CALCIUM 80 MG PO TABS
80.0000 mg | ORAL_TABLET | Freq: Every day | ORAL | Status: DC
  Administered 2022-03-06 – 2022-03-09 (×4): 80 mg via ORAL
  Filled 2022-03-06: qty 1
  Filled 2022-03-06: qty 2
  Filled 2022-03-06: qty 1
  Filled 2022-03-06: qty 2

## 2022-03-06 MED ORDER — APIXABAN 2.5 MG PO TABS
2.5000 mg | ORAL_TABLET | Freq: Two times a day (BID) | ORAL | Status: DC
  Administered 2022-03-08 – 2022-03-09 (×3): 2.5 mg via ORAL
  Filled 2022-03-06 (×9): qty 1

## 2022-03-06 MED ORDER — BUDESONIDE 0.25 MG/2ML IN SUSP
0.2500 mg | Freq: Two times a day (BID) | RESPIRATORY_TRACT | Status: DC
  Administered 2022-03-06 – 2022-03-09 (×7): 0.25 mg via RESPIRATORY_TRACT
  Filled 2022-03-06 (×10): qty 2

## 2022-03-06 MED ORDER — LACTATED RINGERS IV SOLN: INTRAVENOUS | Status: DC

## 2022-03-06 MED ORDER — MAGNESIUM SULFATE 4 GM/100ML IV SOLN
4.0000 g | Freq: Once | INTRAVENOUS | Status: AC
  Administered 2022-03-06: 4 g via INTRAVENOUS
  Filled 2022-03-06: qty 100

## 2022-03-06 MED ORDER — PROPOFOL 10 MG/ML IV BOLUS
INTRAVENOUS | Status: AC | PRN
  Administered 2022-03-06 (×2): 20 mg via INTRAVENOUS

## 2022-03-06 MED ORDER — ALBUTEROL SULFATE (2.5 MG/3ML) 0.083% IN NEBU: 3.0000 mL | INHALATION_SOLUTION | RESPIRATORY_TRACT | Status: DC | PRN

## 2022-03-06 MED ORDER — PROPOFOL 10 MG/ML IV BOLUS
INTRAVENOUS | Status: AC
  Filled 2022-03-06: qty 20

## 2022-03-06 MED ORDER — UMECLIDINIUM BROMIDE 62.5 MCG/ACT IN AEPB
1.0000 | INHALATION_SPRAY | Freq: Every day | RESPIRATORY_TRACT | Status: DC
  Administered 2022-03-06 – 2022-03-09 (×4): 1 via RESPIRATORY_TRACT
  Filled 2022-03-06: qty 7

## 2022-03-06 MED ORDER — TRIAMCINOLONE ACETONIDE 55 MCG/ACT NA AERO: 1.0000 | INHALATION_SPRAY | Freq: Every day | NASAL | Status: DC

## 2022-03-06 MED ORDER — HYDRALAZINE HCL 50 MG PO TABS
100.0000 mg | ORAL_TABLET | Freq: Three times a day (TID) | ORAL | Status: DC
  Administered 2022-03-06 – 2022-03-09 (×8): 100 mg via ORAL
  Filled 2022-03-06 (×3): qty 4
  Filled 2022-03-06 (×3): qty 2
  Filled 2022-03-06: qty 4
  Filled 2022-03-06 (×2): qty 2
  Filled 2022-03-06: qty 4

## 2022-03-06 MED ORDER — POTASSIUM CHLORIDE CRYS ER 20 MEQ PO TBCR
20.0000 meq | EXTENDED_RELEASE_TABLET | Freq: Once | ORAL | Status: AC
  Administered 2022-03-06: 20 meq via ORAL
  Filled 2022-03-06: qty 1

## 2022-03-06 MED ORDER — PROPOFOL 10 MG/ML IV BOLUS: 1.0000 mg/kg | Freq: Once | INTRAVENOUS | Status: DC

## 2022-03-06 MED ORDER — SODIUM CHLORIDE 0.9 % IV BOLUS
1000.0000 mL | Freq: Once | INTRAVENOUS | Status: AC
  Administered 2022-03-06: 1000 mL via INTRAVENOUS

## 2022-03-06 MED ORDER — HYDROCODONE-ACETAMINOPHEN 5-325 MG PO TABS
1.0000 | ORAL_TABLET | Freq: Four times a day (QID) | ORAL | Status: DC | PRN
  Administered 2022-03-06 – 2022-03-07 (×3): 1 via ORAL
  Filled 2022-03-06 (×3): qty 1

## 2022-03-06 MED ORDER — HYDRALAZINE HCL 20 MG/ML IJ SOLN: 10.0000 mg | Freq: Four times a day (QID) | INTRAMUSCULAR | Status: DC | PRN

## 2022-03-06 MED ORDER — FUROSEMIDE 10 MG/ML IJ SOLN
40.0000 mg | Freq: Once | INTRAMUSCULAR | Status: AC
  Administered 2022-03-06: 40 mg via INTRAVENOUS
  Filled 2022-03-06: qty 4

## 2022-03-06 MED ORDER — METOPROLOL TARTRATE 50 MG PO TABS
50.0000 mg | ORAL_TABLET | Freq: Two times a day (BID) | ORAL | Status: DC
  Administered 2022-03-06 – 2022-03-08 (×6): 50 mg via ORAL
  Filled 2022-03-06: qty 1
  Filled 2022-03-06 (×3): qty 2
  Filled 2022-03-06 (×2): qty 1

## 2022-03-06 MED ORDER — POTASSIUM CHLORIDE CRYS ER 20 MEQ PO TBCR
40.0000 meq | EXTENDED_RELEASE_TABLET | Freq: Once | ORAL | Status: AC
  Administered 2022-03-06: 40 meq via ORAL
  Filled 2022-03-06: qty 2

## 2022-03-06 MED ORDER — ACETAMINOPHEN 325 MG PO TABS
650.0000 mg | ORAL_TABLET | Freq: Four times a day (QID) | ORAL | Status: DC | PRN
  Administered 2022-03-06: 650 mg via ORAL
  Filled 2022-03-06 (×3): qty 2

## 2022-03-06 MED ORDER — LEFLUNOMIDE 10 MG PO TABS
20.0000 mg | ORAL_TABLET | Freq: Every day | ORAL | Status: DC
  Administered 2022-03-06 – 2022-03-09 (×4): 20 mg via ORAL
  Filled 2022-03-06: qty 2
  Filled 2022-03-06 (×2): qty 1
  Filled 2022-03-06 (×4): qty 2

## 2022-03-06 MED ORDER — ARFORMOTEROL TARTRATE 15 MCG/2ML IN NEBU
15.0000 ug | INHALATION_SOLUTION | Freq: Two times a day (BID) | RESPIRATORY_TRACT | Status: DC
  Administered 2022-03-06 – 2022-03-09 (×7): 15 ug via RESPIRATORY_TRACT
  Filled 2022-03-06 (×9): qty 2

## 2022-03-06 MED ORDER — DOCUSATE SODIUM 100 MG PO CAPS
200.0000 mg | ORAL_CAPSULE | Freq: Two times a day (BID) | ORAL | Status: DC
  Administered 2022-03-06 – 2022-03-08 (×6): 200 mg via ORAL
  Filled 2022-03-06 (×7): qty 2

## 2022-03-06 MED ORDER — FENTANYL CITRATE PF 50 MCG/ML IJ SOSY
12.5000 ug | PREFILLED_SYRINGE | Freq: Once | INTRAMUSCULAR | Status: AC
  Administered 2022-03-06: 12.5 ug via INTRAVENOUS
  Filled 2022-03-06: qty 1

## 2022-03-06 MED ORDER — FUROSEMIDE 10 MG/ML IJ SOLN
20.0000 mg | Freq: Once | INTRAMUSCULAR | Status: AC
  Administered 2022-03-06: 20 mg via INTRAVENOUS
  Filled 2022-03-06: qty 2

## 2022-03-06 MED ORDER — HYDRALAZINE HCL 25 MG PO TABS
50.0000 mg | ORAL_TABLET | Freq: Three times a day (TID) | ORAL | Status: DC
  Filled 2022-03-06: qty 2

## 2022-03-06 MED ORDER — PANTOPRAZOLE SODIUM 40 MG PO TBEC
40.0000 mg | DELAYED_RELEASE_TABLET | Freq: Two times a day (BID) | ORAL | Status: DC
  Administered 2022-03-06 – 2022-03-09 (×6): 40 mg via ORAL
  Filled 2022-03-06 (×6): qty 1

## 2022-03-06 MED ORDER — POLYETHYLENE GLYCOL 3350 17 G PO PACK
17.0000 g | PACK | Freq: Two times a day (BID) | ORAL | Status: DC
  Administered 2022-03-07 – 2022-03-08 (×2): 17 g via ORAL
  Filled 2022-03-06 (×5): qty 1

## 2022-03-06 NOTE — Progress Notes (Signed)
Orthopedic Tech Progress Note Patient Details:  [name] 01-25-1941 [id number]  Ortho Devices Type of Ortho Device: Knee Immobilizer Ortho Device/Splint Location: lle Ortho Device/Splint Interventions: Ordered, Application, Adjustment  I was called post reduction to apply a knee immobilizer. When I arrived at the room someone had already applied a knee immobilizer to the patient. It was a too large size. I adjusted it and made it fit the best I could. Post Interventions Patient Tolerated: Well Instructions Provided: Care of device, Adjustment of device  [name] 03/06/2022, 4:34 AM

## 2022-03-06 NOTE — ED Provider Notes (Signed)
.  Sedation  Date/Time: 03/06/2022 6:08 AM  Performed by: Maudie Flakes, MD Authorized by: Maudie Flakes, MD   Consent:    Consent obtained:  Verbal and written   Consent given by:  Patient   Risks discussed:  Allergic reaction, nausea, vomiting, respiratory compromise necessitating ventilatory assistance and intubation, inadequate sedation, prolonged sedation necessitating reversal and prolonged hypoxia resulting in organ damage Universal protocol:    Procedure explained and questions answered to patient or proxy's satisfaction: yes     Immediately prior to procedure, a time out was called: yes     Patient identity confirmed:  Verbally with patient Indications:    Procedure performed:  Dislocation reduction   Procedure necessitating sedation performed by:  Physician performing sedation Pre-sedation assessment:    Time since last food or drink:  4 hours   ASA classification: class 2 - patient with mild systemic disease     Mouth opening:  3 or more finger widths   Mallampati score:  I - soft palate, uvula, fauces, pillars visible   Neck mobility: reduced     Pre-sedation assessments completed and reviewed: airway patency, cardiovascular function, hydration status, mental status, nausea/vomiting, pain level, respiratory function and temperature   Immediate pre-procedure details:    Reviewed: vital signs, relevant labs/tests and NPO status     Verified: bag valve mask available, emergency equipment available, intubation equipment available, IV patency confirmed, oxygen available and suction available   Procedure details (see MAR for exact dosages):    Preoxygenation:  Nasal cannula   Sedation:  Propofol   Intended level of sedation: deep   Analgesia:  Hydromorphone   Intra-procedure monitoring:  Blood pressure monitoring, cardiac monitor, continuous pulse oximetry, continuous capnometry, frequent LOC assessments and frequent vital sign checks   Intra-procedure events: hypoxia      Intra-procedure management:  BVM ventilation   Total Provider sedation time (minutes):  23 Post-procedure details:    Attendance: Constant attendance by certified staff until patient recovered     Recovery: Patient returned to pre-procedure baseline     Post-sedation assessments completed and reviewed: airway patency, cardiovascular function, hydration status, mental status, nausea/vomiting, pain level, respiratory function and temperature     Patient is stable for discharge or admission: yes     Procedure completion:  Tolerated well, no immediate complications     Maudie Flakes, MD 03/06/22 [phone]

## 2022-03-06 NOTE — Evaluation (Signed)
Physical Therapy Evaluation Patient Details Name: [name] MRN: [id number] DOB: 1940/11/22 Today's Date: 03/06/2022  History of Present Illness  Pt is 81 yo female admitted 03/06/22 with posterior L hip dislocation and AKI on CKD.  L hip was reduced on 03/06/22 in ED with light sedation. Ortho recommended KI at all time, WBAT, and posterior hip precautions per orders.  Pt with hx including L anterior THA 01/18/22.  Other hx includes CKD, COPD, HLD, hypothyroidism, LE DVT, RA, GI Bleed 10/23.  Clinical Impression  Pt admitted with above diagnosis. Pt had recent THA but had returned to ambulating around ILF facility with RW.  Today, she required frequent cues for posterior precautions, min A for bed mobility with increased time/effort, and mod A sit to stand.  Once standing pt able to ambulate 50' with RW and min guard.  At this time recommend SNF due to level of assist required and posterior hip precautions. However, does have good potential to progress to possible HH level if spouse able to assist with transfers.  Do recommend OT consult due to new hip precautions and dislocated while dressing. Pt currently with functional limitations due to the deficits listed below (see PT Problem List). Pt will benefit from skilled PT to increase their independence and safety with mobility to allow discharge to the venue listed below.          Recommendations for follow up therapy are one component of a multi-disciplinary discharge planning process, led by the attending physician.  Recommendations may be updated based on patient status, additional functional criteria and insurance authorization.  Follow Up Recommendations Skilled nursing-short term rehab (<3 hours/day) (potential to progress to [facility] level) Can patient physically be transported by private vehicle: Yes    Assistance Recommended at Discharge Intermittent Supervision/Assistance  Patient can return home with the following  A little help with walking  and/or transfers;A little help with bathing/dressing/bathroom;Assistance with cooking/housework;Assist for transportation;Help with stairs or ramp for entrance    Equipment Recommendations None recommended by PT  Recommendations for Other Services  OT consult    Functional Status Assessment Patient has had a recent decline in their functional status and demonstrates the ability to make significant improvements in function in a reasonable and predictable amount of time.     Precautions / Restrictions Precautions Precautions: Fall;Posterior Hip Precaution Booklet Issued: Yes (comment) Precaution Comments: Provided 2 copies of posterior precautions and educated on precautions Required Braces or Orthoses: Knee Immobilizer - Left Knee Immobilizer - Left: Other (comment) (at all times except in CPM or with PT) Restrictions LLE Weight Bearing: Weight bearing as tolerated      Mobility  Bed Mobility Overal bed mobility: Needs Assistance Bed Mobility: Supine to Sit, Sit to Supine     Supine to sit: Min assist Sit to supine: Min assist   General bed mobility comments: Cues for sequencing and posterior precautions; min A for L LE and to scoot forward    Transfers Overall transfer level: Needs assistance Equipment used: Rolling walker (2 wheels) Transfers: Sit to/from Stand Sit to Stand: Mod assist, From elevated surface           General transfer comment: Required cues for posterior precautions (not leaning forward) the mod A to rise from elevated bed    Ambulation/Gait Ambulation/Gait assistance: Min guard Gait Distance (Feet): 50 Feet Assistive device: Rolling walker (2 wheels) Gait Pattern/deviations: Step-through pattern, Decreased stride length       General Gait Details: Min cues for RW  proximity; able to weight bear on L LE without increased pain  Stairs            Wheelchair Mobility    Modified Rankin (Stroke Patients Only)       Balance Overall  balance assessment: Needs assistance Sitting-balance support: Feet supported Sitting balance-Leahy Scale: Good     Standing balance support: Reliant on assistive device for balance, Bilateral upper extremity supported Standing balance-Leahy Scale: Poor Standing balance comment: requiring RW; stable with RW                             Pertinent Vitals/Pain Pain Assessment Pain Assessment: No/denies pain    Home Living Family/patient expects to be discharged to:: Private residence Living Arrangements: Spouse/significant other Available Help at Discharge: Family;Available 24 hours/day Type of Home: Independent living facility Home Access: [place]: One level Home Equipment: Conservation officer, nature (2 wheels);Rollator (4 wheels);Cane - single point;Shower seat;Grab bars - tub/shower;Grab bars - toilet;Hand held shower head Additional Comments: [facility]    Prior Function Prior Level of Function : Independent/Modified Independent             Mobility Comments: Prior to initial hip fracture (9/23) pt ambulated with RW and could ambulate in community; since hip fx could ambulate in hallways at ILF with RW and rest breaks ADLs Comments: Independent with ADLs prior to hip fx 9/23; since injury has had minimal assist with ADLs     Hand Dominance   Dominant Hand: Left    Extremity/Trunk Assessment   Upper Extremity Assessment Upper Extremity Assessment: Overall WFL for tasks assessed    Lower Extremity Assessment Lower Extremity Assessment: LLE deficits/detail;RLE deficits/detail RLE Deficits / Details: ROM WFL; MMT 5/5 LLE Deficits / Details: ROM: ankle WNL, knee in KI, hip within posterior precautions; MMT: ankle 5/5, knee not tested in KI, hip 1/5    Cervical / Trunk Assessment Cervical / Trunk Assessment: Normal  Communication   Communication: No difficulties  Cognition Arousal/Alertness: Awake/alert Behavior During Therapy: WFL  for tasks assessed/performed Overall Cognitive Status: Within Functional Limits for tasks assessed                                          General Comments General comments (skin integrity, edema, etc.): VSS; pt dislocated hip while dressing (reports pulling into hip flexion and internal rotation to don underwear)    Exercises     Assessment/Plan    PT Assessment Patient needs continued PT services  PT Problem List Decreased strength;Decreased range of motion;Decreased activity tolerance;Decreased balance;Decreased mobility;Decreased knowledge of use of DME;Decreased safety awareness;Decreased knowledge of precautions;Pain       PT Treatment Interventions DME instruction;Gait training;Stair training;Functional mobility training;Therapeutic activities;Therapeutic exercise;Balance training;Neuromuscular re-education;Cognitive remediation;Patient/family education    PT Goals (Current goals can be found in the Care Plan section)  Acute Rehab PT Goals Patient Stated Goal: return home PT Goal Formulation: With patient/family Time For Goal Achievement: 03/20/22 Potential to Achieve Goals: Good    Frequency Min 4X/week     Co-evaluation               AM-PAC PT "6 Clicks" Mobility  Outcome Measure Help needed turning from your back to your side while in a flat bed without using bedrails?: A Little Help  needed moving from lying on your back to sitting on the side of a flat bed without using bedrails?: A Little Help needed moving to and from a bed to a chair (including a wheelchair)?: A Little Help needed standing up from a chair using your arms (e.g., wheelchair or bedside chair)?: A Lot Help needed to walk in hospital room?: A Little Help needed climbing 3-5 steps with a railing? : A Lot 6 Click Score: 16    End of Session Equipment Utilized During Treatment: Gait belt;Left knee immobilizer Activity Tolerance: Patient tolerated treatment well Patient left:  in bed;with call bell/phone within reach;with bed alarm set Nurse Communication: Mobility status PT Visit Diagnosis: Difficulty in walking, not elsewhere classified (R26.2);Other abnormalities of gait and mobility (R26.89)    Time: 1701-1730 PT Time Calculation (min) (ACUTE ONLY): 29 min   Charges:   PT Evaluation $PT Eval Low Complexity: 1 Low PT Treatments $Therapeutic Activity: 8-22 mins        Abran Richard, PT Acute Rehab [facility] [phone]   Karlton Lemon 03/06/2022, 5:53 PM

## 2022-03-06 NOTE — Progress Notes (Signed)
PROGRESS NOTE                                                                                                                                                                                                             Patient Demographics:    [name], is a 81 y.o. female, DOB - 1941/02/16, OEV:[id number]  Outpatient Primary MD for the patient is Plotnikov, [name], MD    LOS - 0  Admit date - 03/06/2022    Chief Complaint  Patient presents with   Hip Pain       Brief Narrative (HPI from H&P)     81 y.o. 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  Hospitalist team was requested to admit the patient.   Subjective:    [name] today has, No headache, No chest pain, No abdominal pain - No Nausea, No new weakness tingling or numbness, no SOB   Assessment  & Plan :   Left hip posterior dislocation.  Recent history of left hip arthroplasty, left hip was reduced by the ER physician in the ER on 03/06/2022, will request orthopedics to evaluate once as well.  Will follow their weightbearing instructions and PT OT recommendations.  Continue supportive care.   AKI on CKD 4 with mild metabolic acidosis.  Massive fluid overload baseline creatinine of around  2, commence Lasix and monitor.  Unna boots to both lower extremities.  Hypertension.  In poor control.  Placed on combination of Coreg and hydralazine monitor and adjust.  Discontinue Norvasc due to massive lower extremity edema.  Anemia of chronic disease.  No acute issues.  Recent history of LGI diverticular bleed.  On PPI.  Avoid constipation.  Recent history of DVT.  Resume home dose Eliquis.  COPD.  At baseline no wheezing or shortness of breath, supportive care.  Dyslipidemia.  On home dose statin.  Hypothyroidism.  Home dose Synthroid.  Hypomagnesemia.  Replaced.      Condition - Fair  Family Communication  : None present  Code Status : Full code  Consults  :  Ortho  PUD Prophylaxis : PPI   Procedures  :            Disposition Plan  :    Status is: Observation  DVT Prophylaxis  :    apixaban (ELIQUIS) tablet 2.5 mg Start: 03/06/22 1000 SCDs Start: 03/06/22 0557 apixaban (ELIQUIS) tablet 2.5 mg     Lab Results  Component Value Date   PLT 378 03/06/2022    Diet :  Diet Order             Diet regular Room service appropriate? Yes; Fluid consistency: Thin  Diet effective now                    Inpatient Medications  Scheduled Meds:  apixaban  2.5 mg Oral BID   arformoterol  15 mcg Nebulization BID   And   umeclidinium bromide  1 puff Inhalation Daily   atorvastatin  80 mg Oral Daily   budesonide (PULMICORT) nebulizer solution  0.25 mg Nebulization BID   famotidine  20 mg Oral Daily   furosemide  40 mg Intravenous Once   hydrALAZINE  100 mg Oral Q8H   leflunomide  20 mg Oral Daily   levothyroxine  88 mcg Oral QAC breakfast   metoprolol tartrate  50 mg Oral BID   pantoprazole  40 mg Oral BID AC   potassium chloride  40 mEq Oral Once   propofol  1 mg/kg Intravenous Once   propofol       Continuous Infusions:  magnesium sulfate bolus IVPB     PRN Meds:.acetaminophen **OR** acetaminophen, albuterol, hydrALAZINE,  propofol  Antibiotics  :    Anti-infectives (From admission, onward)    None         Objective:   Vitals:   03/06/22 0836 03/06/22 0900 03/06/22 0930 03/06/22 1000  BP:  (!) 181/77 (!) 173/73 (!) 199/70  Pulse:  89 85 78  Resp:  14 (!) 22 15  Temp: (!) 97.5 F (36.4 C)     TempSrc: Oral     SpO2:  97% 97% 100%  Weight:      Height:        Wt Readings from Last 3 Encounters:  03/06/22 74.8 kg  03/01/22 75.2 kg  02/15/22 72.1 kg    No intake or output data in the 24 hours ending 03/06/22 1108   Physical Exam  Awake Alert, No new F.N deficits, Normal affect [place].AT,PERRAL Supple Neck, No JVD,   Symmetrical Chest wall movement, Good air movement bilaterally, CTAB RRR,No Gallops,Rubs or new Murmurs,  +ve B.Sounds, Abd Soft, No tenderness,   3+ leg edema, L Knee immobalizer    Data Review:    CBC Recent Labs  Lab 03/06/22 0222  WBC 8.7  HGB 9.7*  HCT 29.2*  PLT 378  MCV 91.3  MCH 30.3  MCHC 33.2  RDW 17.5*  LYMPHSABS 1.8  MONOABS 2.0*  EOSABS 0.4  BASOSABS 0.2*    Electrolytes Recent Labs  Lab 03/06/22 0222  NA 137  K 3.5  CL 100  CO2 20*  GLUCOSE 116*  BUN 43*  CREATININE 3.02*  CALCIUM 9.0  MG 1.1*  Micro Results No results found for this or any previous visit (from the past 240 hour(s)).  Radiology Reports DG HIP UNILAT WITH PELVIS 1V LEFT  Result Date: 03/06/2022 CLINICAL DATA:  Postreduction left hip.  [id number]. EXAM: DG HIP (WITH OR WITHOUT PELVIS) 1V*L* COMPARISON:  Earlier study today at 1:51 a.m. FINDINGS: Single AP view reveals relocation of the left femoral head prosthesis into the acetabular cup prosthesis. Evidence of fractures is not seen. IMPRESSION: Relocation of the left femoral head prosthesis into the acetabular cup prosthesis. Electronically Signed   By: Telford Nab M.D.   On: 03/06/2022 04:33   DG Hip Unilat With Pelvis 2-3 Views Left  Result Date: 03/06/2022 CLINICAL DATA:  Left hip pain. EXAM: DG HIP (WITH  OR WITHOUT PELVIS) 2-3V LEFT COMPARISON:  None Available. FINDINGS: A total left hip replacement is seen with dorsal dislocation of the left femoral prosthesis. There is no evidence of an acute fracture. Soft tissue structures are unremarkable. IMPRESSION: Total left hip replacement with dorsal dislocation of the left femoral prosthesis. Electronically Signed   By: Virgina Norfolk M.D.   On: 03/06/2022 02:11      Signature  Lala Lund M.D on 03/06/2022 at 11:08 AM   -  To page go to [email or web address]

## 2022-03-06 NOTE — Progress Notes (Signed)
Ms. Weng is a patient of mine who underwent a left hip replacement in September.  Unfortunately, her postop course has been complicated by DVT/PE, GI bleed, ARF, hip dislocation today.  She has been through a lot in these last 6 weeks.  I went by to see her today in the ER.  Family was present.  It sounds like she was on the toilet and had trouble either getting off of it or she was trying to put on pants and twisted her leg which resulted in the dislocation.  The dislocation has been successfully reduced.  We discussed how prosthetic hips can sometimes dislocate.  We also talked about placing her on 3 months of posterior hip precautions.  Her postop rehab has been interrupted a few times which has really set her back.  From an orthopedic stand point, she should mobilize with PT twice a day and adhere to posterior hip precautions.  We will follow along while she's here.  Her left hip surgical scar is fully healed.  She has developed a fair amount of BLE pitting edema since I last saw her.  He has unna wraps currently on both legs.    Azucena Cecil, MD [facility] 7:51 PM

## 2022-03-06 NOTE — Progress Notes (Signed)
Orthopedic Tech Progress Note Patient Details:  GRAYSON WHITE 1940-08-10 [id number]  Went and founded a hospital bed for patient, then Danton Sewer) applied [employer] I held   Ortho Devices Type of Ortho Device: Ace wrap, Unna boot Ortho Device/Splint Location: BLE Ortho Device/Splint Interventions: Ordered, Adjustment, Application   Post Interventions Patient Tolerated: Well Instructions Provided: Care of device  Janit Pagan 03/06/2022, 11:55 AM

## 2022-03-06 NOTE — ED Notes (Signed)
Rivka Barbara PA-C, notified of pt BP reading and also pt complaints of urinary urge to void but inability to produce stream. Per provider, intermittent catheterization to be performed and vitals re-assessed afterwards and PRN pain mediation as needed.

## 2022-03-06 NOTE — ED Provider Notes (Signed)
[facility] EMERGENCY DEPARTMENT Provider Note   CSN: [id number] Arrival date & time: 03/06/22  0109     History  Chief Complaint  Patient presents with   Hip Pain    [name] is a 81 y.o. female. With past medical history of MI, CVA, AAA, CKD, COPD, asplenia, HTN who presents to the emergency department with hip pain.  States earlier today she was walking around when she felt a sudden pain in her left hip.  States that it felt like something slipped out and then immediately went back again.  States that then this evening around 10 PM she was in the bathroom.  States that she was putting on a depends when she lifted her left leg, turned her foot and then had a severe pain in her left hip.  States that then she was unable to move after that.  Called EMS.  She recently had hip replacement on 01/23/2022 by Dr. Erlinda Hong.  She uses a cane and walker to get around.   Hip Pain       Home Medications Prior to Admission medications   Medication Sig Start Date End Date Taking? Authorizing Provider  acetaminophen (TYLENOL) 325 MG tablet Take 650 mg by mouth every 6 (six) hours as needed (generalized pain).    [provider]  albuterol (VENTOLIN HFA) 108 (90 Base) MCG/ACT inhaler Inhale 1-2 puffs into the lungs every 4 (four) hours as needed for wheezing or shortness of breath.    [provider]  amLODipine (NORVASC) 2.5 MG tablet Take 1 tablet (2.5 mg total) by mouth daily. 09/29/21   Plotnikov, [name], MD  apixaban (ELIQUIS) 2.5 MG TABS tablet Take 1 tablet (2.5 mg total) by mouth 2 (two) times daily. 03/01/22   Plotnikov, [name], MD  ascorbic acid (VITAMIN C) 500 MG tablet Take 500 mg by mouth daily.    [provider]  atorvastatin (LIPITOR) 80 MG tablet Take 80 mg by mouth daily.    [provider]  Cholecalciferol (VITAMIN D3) 50 MCG (2000 UT) capsule Take 1 capsule (2,000 Units total) by mouth daily. 11/13/18   Plotnikov, [name], MD  Cyanocobalamin 2500 MCG TABS Take 2,500 mcg by mouth daily.    [provider]  docusate sodium (COLACE) 100 MG capsule Take 1-2 capsules (100-200 mg total) by mouth daily as needed for mild constipation. 03/01/22   Plotnikov, [name], MD  famotidine (PEPCID) 20 MG tablet Take 20 mg by mouth 2 (two) times daily.    [provider]  gabapentin (NEURONTIN) 300 MG capsule Take 1 capsule (300 mg total) by mouth at bedtime. 02/11/22   Mariel Aloe, MD  Golimumab 50 MG/0.5ML SOAJ Take 50 mg by mouth every 2 (two) months. Hold for the next 2 weeks per Orthopedic surgery recommendations. 02/11/22   Mariel Aloe, MD  hydrocortisone (ANUSOL-HC) 2.5 % rectal cream Place rectally 2 (two) times daily. Patient taking differently: Place 1 Application rectally 2 (two) times daily. 02/11/22   Mariel Aloe, MD  leflunomide (ARAVA) 20 MG tablet Take 20 mg by mouth daily.     [provider]  levothyroxine (SYNTHROID) 88 MCG tablet TAKE 1 TABLET (88 MCG TOTAL) BY MOUTH DAILY BEFORE BREAKFAST. 01/09/22   Plotnikov, [name], MD  metoprolol tartrate (LOPRESSOR) 50 MG tablet Take 50 mg by mouth 2 (two) times daily.    [provider]  Multiple Vitamins-Minerals (CENTRUM ADULT PO) Take 1 tablet by  mouth daily. 1 Tablet Daily.    [provider]  pantoprazole (PROTONIX) 40 MG tablet Take 40 mg by mouth daily.    [provider]  Tiotropium Bromide-Olodaterol (STIOLTO RESPIMAT) 2.5-2.5 MCG/ACT AERS Inhale 2 each into the lungs at bedtime.    [provider]  torsemide (DEMADEX) 20 MG tablet Take 1-2 tablets (20-40 mg total) by mouth daily as needed. 03/01/22   Plotnikov, [name], MD  triamcinolone (NASACORT) 55 MCG/ACT AERO nasal inhaler Place 1 spray into the nose daily.    [provider]      Allergies    Nsaids, Aspirin, Codeine, and Nitrostat [nitroglycerin]    Review of Systems   Review of Systems  Musculoskeletal:  Positive  for arthralgias and gait problem.  All other systems reviewed and are negative.   Physical Exam Updated Vital Signs BP (!) 195/70   Pulse 96   Temp 98.1 F (36.7 C)   Resp 13   Ht '5\' 3"'$  (1.6 m)   Wt 74.8 kg   SpO2 97%   BMI 29.23 kg/m  Physical Exam Vitals and nursing note reviewed.  Constitutional:      General: She is in acute distress.     Appearance: She is obese. She is not ill-appearing or toxic-appearing.  HENT:     Head: Normocephalic and atraumatic.     Mouth/Throat:     Mouth: Mucous membranes are dry.     Pharynx: Oropharynx is clear.  Eyes:     General: No scleral icterus.    Extraocular Movements: Extraocular movements intact.  Cardiovascular:     Pulses: Normal pulses.     Heart sounds: Normal heart sounds.  Pulmonary:     Effort: Pulmonary effort is normal. No respiratory distress.     Breath sounds: Normal breath sounds.  Abdominal:     Palpations: Abdomen is soft.  Musculoskeletal:        General: Swelling, tenderness and deformity present.     Cervical back: Neck supple.     Left hip: Deformity, tenderness and bony tenderness present. Decreased range of motion. Decreased strength.     Right lower leg: Edema present.     Left lower leg: Edema present.     Comments: Left leg is internally rotated. There is a surgical scar that is well healing. The area around the hip deformity is red, ecchymotic and swollen Able to palpate the head of the femur at the level of the hip with tenderness  DP pulse 1+ bilaterally BLE 2+ pitting edema   Skin:    General: Skin is warm and dry.     Capillary Refill: Capillary refill takes less than 2 seconds.     Findings: Bruising and erythema present. No rash.  Neurological:     General: No focal deficit present.     Mental Status: She is alert and oriented to person, place, and time. Mental status is at baseline.  Psychiatric:        Mood and Affect: Mood normal.        Behavior: Behavior normal.        Thought  Content: Thought content normal.        Judgment: Judgment normal.     ED Results / Procedures / Treatments   Labs (all labs ordered are listed, but only abnormal results are displayed) Labs Reviewed  BASIC METABOLIC PANEL - Abnormal; Notable for the following components:      Result Value   CO2 20 (*)  Glucose, Bld 116 (*)    BUN 43 (*)    Creatinine, Ser 3.02 (*)    GFR, Estimated 15 (*)    Anion gap 17 (*)    All other components within normal limits  CBC WITH DIFFERENTIAL/PLATELET - Abnormal; Notable for the following components:   RBC 3.20 (*)    Hemoglobin 9.7 (*)    HCT 29.2 (*)    RDW 17.5 (*)    nRBC 0.5 (*)    Monocytes Absolute 2.0 (*)    Basophils Absolute 0.2 (*)    All other components within normal limits  URINALYSIS, ROUTINE W REFLEX MICROSCOPIC    EKG None  Radiology DG HIP UNILAT WITH PELVIS 1V LEFT  Result Date: 03/06/2022 CLINICAL DATA:  Postreduction left hip.  [id number]. EXAM: DG HIP (WITH OR WITHOUT PELVIS) 1V*L* COMPARISON:  Earlier study today at 1:51 a.m. FINDINGS: Single AP view reveals relocation of the left femoral head prosthesis into the acetabular cup prosthesis. Evidence of fractures is not seen. IMPRESSION: Relocation of the left femoral head prosthesis into the acetabular cup prosthesis. Electronically Signed   By: Telford Nab M.D.   On: 03/06/2022 04:33   DG Hip Unilat With Pelvis 2-3 Views Left  Result Date: 03/06/2022 CLINICAL DATA:  Left hip pain. EXAM: DG HIP (WITH OR WITHOUT PELVIS) 2-3V LEFT COMPARISON:  None Available. FINDINGS: A total left hip replacement is seen with dorsal dislocation of the left femoral prosthesis. There is no evidence of an acute fracture. Soft tissue structures are unremarkable. IMPRESSION: Total left hip replacement with dorsal dislocation of the left femoral prosthesis. Electronically Signed   By: Virgina Norfolk M.D.   On: 03/06/2022 02:11    Procedures Reduction of dislocation  Date/Time: 03/06/2022  4:17 AM  Performed by: Mickie Hillier, PA-C Authorized by: Mickie Hillier, PA-C  Consent: Written consent obtained. Risks and benefits: risks, benefits and alternatives were discussed Consent given by: patient and spouse Patient understanding: patient states understanding of the procedure being performed Patient consent: the patient's understanding of the procedure matches consent given Procedure consent: procedure consent matches procedure scheduled Relevant documents: relevant documents present and verified Test results: test results available and properly labeled Site marked: the operative site was marked Imaging studies: imaging studies available Required items: required blood products, implants, devices, and special equipment available Patient identity confirmed: arm band, verbally with patient and hospital-assigned identification number Time out: Immediately prior to procedure a "time out" was called to verify the correct patient, procedure, equipment, support staff and site/side marked as required. Local anesthesia used: no  Anesthesia: Local anesthesia used: no  Sedation: Patient sedated: yes Sedation type: moderate (conscious) sedation(see separate sedation note) Sedatives: propofol Vitals: Vital signs were monitored during sedation.  Patient tolerance: patient tolerated the procedure well with no immediate complications      Medications Ordered in ED Medications  propofol (DIPRIVAN) 10 mg/mL bolus/IV push 74.8 mg (200 mg Intravenous See Procedure Record 03/06/22 0415)  propofol (DIPRIVAN) 10 mg/mL bolus/IV push (has no administration in time range)  acetaminophen (TYLENOL) tablet 650 mg (has no administration in time range)    Or  acetaminophen (TYLENOL) suppository 650 mg (has no administration in time range)  fentaNYL (SUBLIMAZE) injection 12.5 mcg (12.5 mcg Intravenous Given 03/06/22 0225)  HYDROmorphone (DILAUDID) injection 1 mg (1 mg Intravenous Given 03/06/22  0255)  sodium chloride 0.9 % bolus 1,000 mL (0 mLs Intravenous Stopped 03/06/22 0443)  propofol (DIPRIVAN) 10 mg/mL bolus/IV push (20 mg Intravenous  Given 03/06/22 0402)    ED Course/ Medical Decision Making/ A&P Clinical Course as of 03/06/22 0610  Mon Mar 06, 2022  0324 Pain improved with dilaudid. Pending room (not in hallway) to reduce hip with procedural sedation.  [LA]    Clinical Course User Index [LA] Mickie Hillier, PA-C                           Medical Decision Making Amount and/or Complexity of Data Reviewed Labs: ordered. Radiology: ordered.  Risk Prescription drug management. Decision regarding hospitalization.  This patient presents to the ED with chief complaint(s) of hip pain with pertinent past medical history of CAD, CVA, HTN, COPD, etc. which further complicates the presenting complaint. The complaint involves an extensive differential diagnosis and also carries with it a high risk of complications and morbidity.    The differential diagnosis includes fracture, subluxation, hardware malfunction   Additional history obtained: Additional history obtained from significant other Records reviewed Care Everywhere/External Records and Primary Care Documents  ED Course and Reassessment: 81 year old female who presents to ED from independent living facility following hip pain.  On my initial exam, this is an elderly appearing female. Non-septic,  non-toxic in appearance. She is in pain on exam. The left leg is flexed, internally rotated and shortened concerning for dislocation. Hip is TTP. Neurovascularly intact.  Obtaining formal films, basic labs if needed for admission. Will start will small dose fentanyl given age and comorbidities.  XR with dorsal dislocation of the left hip. Hardware intact. Giving '1mg'$  dilaudid as she is still in pain.  0305 - labs with AKI. Giving 1 L IVF   0405 - hip reduced successfully with Dr. Sedonia Small at bedside for sedation portion of  procedure.  0420 - will need admission for AKI - obtaining urine studies to evaluate for underlying UTI. Husband is at bedside and does not note any recent complaints of dysuria, nausea, vomiting, diarrhea, known fevers. States she has been eating/drinking somewhat decreased over the past few days. She does  have history of urinary retention.  0600 - Dr. Velia Meyer, hospitalist agrees to admit patient. Placed order for foley, as in and out performed with 1L urinary output. AKI may be again related to retention, UA pending. Admitted. Patient is agreeable to plan.  Independent labs interpretation:  The following labs were independently interpreted: hgb 9.7, stable; bmp with AKI Cr 3.02 (baseline 1.8-2), BUN 43   Independent visualization of imaging: - I independently visualized the following imaging with scope of interpretation limited to determining acute life threatening conditions related to emergency care: XR left hip, which revealed dorsal dislocation of left hip   Consultation: - Consulted or discussed management/test interpretation w/ external professional: hospitalist Dr. Velia Meyer, admission.   Consideration for admission or further workup: admit for AKI Social Determinants of health: none identified  Final Clinical Impression(s) / ED Diagnoses Final diagnoses:  Dislocation of left hip, initial encounter [facility])  Acute kidney injury [facility])    Rx / DC Orders ED Discharge Orders     None         Mickie Hillier, PA-C 03/06/22 0610    Maudie Flakes, MD 03/06/22 [phone]

## 2022-03-06 NOTE — ED Triage Notes (Signed)
Arrives EMS from [facility].   Went to restroom and ambulated back to bed. When sitting on the bed she experienced severe left hip pain and describes it as a "tearing sensation".   Swelling noted and pain on palpation.   Denies falling.

## 2022-03-06 NOTE — Progress Notes (Signed)
PT Cancellation Note  Patient Details Name: [name] MRN: [id number] DOB: 08-26-1940   Cancelled Treatment:    Reason Eval/Treat Not Completed: Other (comment) Pt admitted with hip dislocation that was reduced in ED; pt with recent hip fx and anterior hemiarthroplasty.  Noted in MD note, ortho also consulted.  Will await input from ortho in regards to weight bearing and restrictions prior to mobility [place], [address] 03/06/2022, 12:17 PM

## 2022-03-06 NOTE — ED Notes (Signed)
Ortho techs applied unna boots and pt placed in hospital bed.

## 2022-03-06 NOTE — H&P (Signed)
History and Physical    PLEASE NOTE THAT DRAGON DICTATION SOFTWARE WAS USED IN THE CONSTRUCTION OF THIS NOTE.   [name]:[id number] DOB: [birth year] DOA: 03/06/2022  PCP: Cassandria Anger, MD  Patient coming from: home   I have personally briefly reviewed patient's old medical records in [place]  Chief Complaint: left hip pain  HPI: [name] is a 81 y.o. 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, stage IV chronic kidney disease with baseline creatinine range 1.8-2.0, anemia of chronic kidney disease associated baseline hemoglobin 9-11, COPD, hypertension, hyperlipidemia, acquired hypothyroidism, history of lower extremity DVT/PE, rheumatoid arthritis, who is admitted to [facility] on 03/06/2022 with AKI on CKD 4 after presenting from home to [facility] ED complaining of left hip pain.   This patient with a recent left hip fracture in September 2023, status post left total hip arthroplasty on 01/23/2022.  Postop complications include diagnosis of right lower extremity DVT during the last week of September 2023 followed by diagnosis of acute pulmonary embolism in October 2023.  She is now on anticoagulation via Eliquis.  Subsequent to that she was admitted for acute gastrointestinal bleed in October 2023.  She presents to**emergency department this evening complaining of acute left hip pain, that started while in the bathroom earlier this evening and was attempting to hold her against her after having just urinated in the toilet.  In the process of attempting to pull up her undergarments, she notes that she was twisting her left lower extremity to accommodate these garments, at which time she developed sudden onset of sharp left hip pain radiating into the left groin.  She was able to slowly lower herself back down to the toilet, and conveys that she did not fall as a component of the  sequence.  Denied any ensuing left lower extremity numbness or paresthesias.  She subsequently presented to [facility] emergency department for further evaluation management of this acute left hip discomfort.  She otherwise is without acute complaint.  No recent nausea, vomiting, diarrhea.  Denies any recent subjective fever, chills, rigors, or generalized myalgias.  Medical history notable for stage IV CKD associated baseline creatinine 1.8-2.2, with most recent prior serum creatinine data point noted to be 1.85 on 02/19/2022.  Her medical history is also notable for anemia of chronic kidney disease, associated with baseline hemoglobin range 9-11, most recent prior hemoglobin noted to be 9.3 on 02/21/2022.     ED Course:  Vital signs in the ED were notable for the following: Afebrile; heart rate in the 86N; systolic pressures in the 160s to 180s; respiratory rate 17-23, oxygen saturation 95 to 98% on room air.  Labs were notable for the following: BMP was notable for bicarbonate 20, anion gap 17, BUN 43, creatinine 3.02, glucose 160.  CBC notable for white cell count 8700, hemoglobin 9.7 associated with normocytic/normochromic findings, platelet count 348.  Urinalysis ordered, result currently pending.  Imaging and additional notable ED work-up: Initial plain films of the left hip showed total hip replacement with dorsal dislocation of the left femoral prosthesis.  She subsequently underwent reduction of her left dislocated left femoral prosthesis under procedural sedation via propofol in the emergency department.  Post reduction plain films of the left hip show relocation of the left femoral head prosthesis into the acetabular cup prosthesis.  While in the ED, the following were administered: In addition to aforementioned propofol, she  also received fentanyl 12.5 mg IV x1, Dilaudid 1 mg IV x1.  Subsequently, the patient was admitted for overnight observation for further evaluation and management of  presenting acute kidney injury superimposed on stage IV CKD after presenting with posterior dislocation of left hip prosthesis.     Review of Systems: As per HPI otherwise 10 point review of systems negative.   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 Most likely scar. Mild subendocardial ischemia. EF 71% LOW RISK.    Cervical disc disease    fracture   Chronic kidney disease    COPD (chronic obstructive pulmonary disease) (HCC)    Diverticulosis    DVT (deep venous thrombosis) ([place])    RLE; Sept '23   History of pulmonary embolus (PE)    Oct '23   Hyperlipidemia    Hypertension    Hypothyroidism (acquired)    hypo   Myocardial infarction ([place]) 11/2014   TIA   Polymyalgia rheumatica ([place])    2011 Dr. Charlestine Night   Rheumatoid arthritis [facility]) 2011   Dr.Truslow; fracture knees, hands and wrists -    S/P CABG x 1 1996   CABG--LIMA-LAD for ostial LAD (not felt to be PCI amenable). EF NORMAL then; LIMA now atretic   Shortness of breath dyspnea    with exertion   Stroke ([place]) 11/2014   TIA    Urinary frequency     Past Surgical History:  Procedure Laterality Date   ABDOMINAL AORTIC ANEURYSM REPAIR  8372   Complicated by mesenteric artery stenosis and splenic artery infarction with acquired Asplenia   APPENDECTOMY     BUNIONECTOMY  07/2011   right foot   CARDIAC  CATHETERIZATION  2005   (Most recent CATH) - ostial LAD lesion 20-30% (down from 90% initially). Atretic LIMA. Minimal disease the RCA and Circumflex system.   CARPAL TUNNEL RELEASE Left    CATARACT EXTRACTION Bilateral    CERVICAL SPINE SURGERY     plate 2008 Dr. Saintclair Halsted   COLONOSCOPY     CORONARY ARTERY BYPASS GRAFT  1996   INCLUDED AN INTERNAL MAMMARY ARTERY TO THE LAD. EF WAS NORMAL   INGUINAL HERNIA REPAIR Right    LAPAROSCOPIC APPENDECTOMY N/A 06/28/2016   Procedure: APPENDECTOMY LAPAROSCOPIC;  Surgeon: Leighton Ruff, MD;  Location: [facility];  Service: General;  Laterality: N/A;   NM MYOVIEW LTD  06/2010   Fixed anteroseptal, apical and inferoapical defect with moderate size. Most likely scar. Mild subendocardial ischemia. EF 71% LOW RISK.    SPLENECTOMY     TOTAL HIP ARTHROPLASTY Left 01/23/2022   Procedure: LEFT TOTAL HIP ARTHROPLASTY ANTERIOR APPROACH;  Surgeon: Leandrew Koyanagi, MD;  Location: [place];  Service: Orthopedics;  Laterality: Left;  3-C   TRANSTHORACIC ECHOCARDIOGRAM  12/2014   [facility]: Normal  LV size & function. EF 55-60%,    vagina polyp     VIDEO ASSISTED THORACOSCOPY (VATS)/WEDGE RESECTION Left 03/02/2015   Procedure: VIDEO ASSISTED THORACOSCOPY (VATS), MINI THORACOTOMY, LEFT UPPER LOBE WEDGE, TAKE DOWN OF INTERNAL MAMMARY LESIONS, PLACEMENT OF ON-Q PUMP;  Surgeon: Grace Isaac, MD;  Location: [place];  Service: Thoracic;  Laterality: Left;   VIDEO BRONCHOSCOPY N/A 03/02/2015   Procedure: BRONCHOSCOPY;  Surgeon: Grace Isaac, MD;  Location: [place];  Service: Thoracic;  Laterality: N/A;   VIDEO BRONCHOSCOPY WITH ENDOBRONCHIAL NAVIGATION N/A 10/08/2017   Procedure: VIDEO BRONCHOSCOPY WITH ENDOBRONCHIAL NAVIGATION WITH BIOPSIES OF LEFT UPPER LOBE AND LEFT LOWER LOBE;  Surgeon: Grace Isaac, MD;  Location: [place];  Service: Thoracic;  Laterality: N/A;    Social History:  reports that she quit smoking about 63 years ago. Her smoking use included  cigarettes. She has never been exposed to tobacco smoke. She has never used smokeless tobacco. She reports current alcohol use. She reports that she does not use drugs.   Allergies  Allergen Reactions   Nsaids Other (See Comments)    Stomach upset Told to avoid due to Eliquis   Aspirin Other (See Comments)    Stomach upset Told to avoid due to Eliquis   Codeine Nausea And Vomiting   Nitrostat [Nitroglycerin] Other (See Comments)    Bradycardia     Family History  Problem Relation Age of Onset   Hypertension Mother    Diabetes Mother    Heart disease Mother    Hyperlipidemia Mother    Stroke Father    Hyperlipidemia Sister    Hypertension Sister    Hypertension Daughter    Cancer Paternal Uncle        Deceased from cancer not sure of site   Hypertension Son    Hyperlipidemia Son    Hyperlipidemia Son    Hypertension Son    Hyperlipidemia Son    Hypertension Son    Hypertension Other    Coronary artery disease Other    Asthma Neg Hx    Colon cancer Neg Hx     Family history reviewed and not pertinent    Prior to Admission medications   Medication Sig Start Date End Date Taking? Authorizing Provider  acetaminophen (TYLENOL) 325 MG tablet Take 650 mg by mouth every 6 (six) hours as needed (generalized pain).    [provider]  albuterol (VENTOLIN HFA) 108 (90 Base) MCG/ACT inhaler Inhale 1-2 puffs into the lungs every 4 (four) hours as needed for wheezing or shortness of breath.    [provider]  amLODipine (NORVASC) 2.5 MG tablet Take 1 tablet (2.5 mg total) by mouth daily. 09/29/21   Plotnikov, [name], MD  apixaban (ELIQUIS) 2.5 MG TABS tablet Take 1 tablet (2.5 mg total) by mouth 2 (two) times daily. 03/01/22   Plotnikov, [name], MD  ascorbic acid (VITAMIN C) 500 MG tablet Take 500 mg by mouth daily.    [provider]  atorvastatin (LIPITOR) 80 MG tablet Take 80 mg by mouth daily.    [provider]  Cholecalciferol (VITAMIN  D3) 50 MCG (2000 UT) capsule Take 1 capsule (2,000 Units total) by mouth daily. 11/13/18   Plotnikov, [name], MD  Cyanocobalamin 2500 MCG TABS Take 2,500 mcg by mouth daily.    [provider]  docusate sodium (COLACE) 100 MG capsule Take 1-2 capsules (100-200 mg total) by mouth daily as needed for mild constipation. 03/01/22  Plotnikov, [name], MD  famotidine (PEPCID) 20 MG tablet Take 20 mg by mouth 2 (two) times daily.    [provider]  gabapentin (NEURONTIN) 300 MG capsule Take 1 capsule (300 mg total) by mouth at bedtime. 02/11/22   Mariel Aloe, MD  Golimumab 50 MG/0.5ML SOAJ Take 50 mg by mouth every 2 (two) months. Hold for the next 2 weeks per Orthopedic surgery recommendations. 02/11/22   Mariel Aloe, MD  hydrocortisone (ANUSOL-HC) 2.5 % rectal cream Place rectally 2 (two) times daily. Patient taking differently: Place 1 Application rectally 2 (two) times daily. 02/11/22   Mariel Aloe, MD  leflunomide (ARAVA) 20 MG tablet Take 20 mg by mouth daily.     [provider]  levothyroxine (SYNTHROID) 88 MCG tablet TAKE 1 TABLET (88 MCG TOTAL) BY MOUTH DAILY BEFORE BREAKFAST. 01/09/22   Plotnikov, [name], MD  metoprolol tartrate (LOPRESSOR) 50 MG tablet Take 50 mg by mouth 2 (two) times daily.    [provider]  Multiple Vitamins-Minerals (CENTRUM ADULT PO) Take 1 tablet by mouth daily. 1 Tablet Daily.    [provider]  pantoprazole (PROTONIX) 40 MG tablet Take 40 mg by mouth daily.    [provider]  Tiotropium Bromide-Olodaterol (STIOLTO RESPIMAT) 2.5-2.5 MCG/ACT AERS Inhale 2 each into the lungs at bedtime.    [provider]  torsemide (DEMADEX) 20 MG tablet Take 1-2 tablets (20-40 mg total) by mouth daily as needed. 03/01/22   Plotnikov, [name], MD  triamcinolone (NASACORT) 55 MCG/ACT AERO nasal inhaler Place 1 spray into the nose daily.    [provider]     Objective    Physical  Exam: Vitals:   03/06/22 0409 03/06/22 0410 03/06/22 0415 03/06/22 0432  BP:  (!) 171/75 (!) 186/65 (!) 195/70  Pulse: 95 91 93 96  Resp: (!) _0 Temp:      SpO2: 100% 99% 90% 97%  Weight:      Height:        General: appears to be stated age; alert, oriented Skin: warm, dry, no rash Head:  AT/[place] Mouth:  Oral mucosa membranes appear dry, normal dentition Neck: supple; trachea midline Heart:  RRR; did not appreciate any M/R/G Lungs: CTAB, did not appreciate any wheezes, rales, or rhonchi Abdomen: + BS; soft, ND, NT Vascular: 2+ pedal pulses b/l; 2+ radial pulses b/l Extremities: no peripheral edema, no muscle wasting Neuro: strength and sensation intact in upper and lower extremities b/l  Labs on Admission: I have personally reviewed following labs and imaging studies  CBC: Recent Labs  Lab 03/06/22 0222  WBC 8.7  NEUTROABS 4.3  HGB 9.7*  HCT 29.2*  MCV 91.3  PLT 583   Basic Metabolic Panel: Recent Labs  Lab 03/06/22 0222  NA 137  K 3.5  CL 100  CO2 20*  GLUCOSE 116*  BUN 43*  CREATININE 3.02*  CALCIUM 9.0   GFR: Estimated Creatinine Clearance: 13.7 mL/min (A) (by C-G formula based on SCr of 3.02 mg/dL (H)). Liver Function Tests: No results for input(s): "AST", "ALT", "ALKPHOS", "BILITOT", "PROT", "ALBUMIN" in the last 168 hours. No results for input(s): "LIPASE", "AMYLASE" in the last 168 hours. No results for input(s): "AMMONIA" in the last 168 hours. Coagulation Profile: No results for input(s): "INR", "PROTIME" in the last 168 hours. Cardiac Enzymes: No results for input(s): "CKTOTAL", "CKMB", "CKMBINDEX", "TROPONINI" in the last 168 hours. BNP (last 3 results) No results for input(s): "  PROBNP" in the last 8760 hours. HbA1C: No results for input(s): "HGBA1C" in the last 72 hours. CBG: No results for input(s): "GLUCAP" in the last 168 hours. Lipid Profile: No results for input(s): "CHOL", "HDL", "LDLCALC", "TRIG", "CHOLHDL", "LDLDIRECT" in  the last 72 hours. Thyroid Function Tests: No results for input(s): "TSH", "T4TOTAL", "FREET4", "T3FREE", "THYROIDAB" in the last 72 hours. Anemia Panel: No results for input(s): "VITAMINB12", "FOLATE", "FERRITIN", "TIBC", "IRON", "RETICCTPCT" in the last 72 hours. Urine analysis:    Component Value Date/Time   COLORURINE AMBER (A) 02/06/2022 1800   APPEARANCEUR CLOUDY (A) 02/06/2022 1800   LABSPEC 1.014 02/06/2022 1800   PHURINE 5.0 02/06/2022 1800   GLUCOSEU NEGATIVE 02/06/2022 1800   GLUCOSEU NEGATIVE 11/14/2018 1508   HGBUR NEGATIVE 02/06/2022 1800   BILIRUBINUR NEGATIVE 02/06/2022 1800   KETONESUR NEGATIVE 02/06/2022 1800   PROTEINUR 100 (A) 02/06/2022 1800   UROBILINOGEN 0.2 11/14/2018 1508   NITRITE NEGATIVE 02/06/2022 1800   LEUKOCYTESUR MODERATE (A) 02/06/2022 1800    Radiological Exams on Admission: DG HIP UNILAT WITH PELVIS 1V LEFT  Result Date: 03/06/2022 CLINICAL DATA:  Postreduction left hip.  [id number]. EXAM: DG HIP (WITH OR WITHOUT PELVIS) 1V*L* COMPARISON:  Earlier study today at 1:51 a.m. FINDINGS: Single AP view reveals relocation of the left femoral head prosthesis into the acetabular cup prosthesis. Evidence of fractures is not seen. IMPRESSION: Relocation of the left femoral head prosthesis into the acetabular cup prosthesis. Electronically Signed   By: Telford Nab M.D.   On: 03/06/2022 04:33   DG Hip Unilat With Pelvis 2-3 Views Left  Result Date: 03/06/2022 CLINICAL DATA:  Left hip pain. EXAM: DG HIP (WITH OR WITHOUT PELVIS) 2-3V LEFT COMPARISON:  None Available. FINDINGS: A total left hip replacement is seen with dorsal dislocation of the left femoral prosthesis. There is no evidence of an acute fracture. Soft tissue structures are unremarkable. IMPRESSION: Total left hip replacement with dorsal dislocation of the left femoral prosthesis. Electronically Signed   By: Virgina Norfolk M.D.   On: 03/06/2022 02:11     Assessment/Plan   Principal Problem:    Acute renal failure superimposed on stage 4 chronic kidney disease (HCC) Active Problems:   Essential hypertension   Rheumatoid arthritis (HCC)   Acquired hypothyroidism   Hyperlipidemia   COPD (chronic obstructive pulmonary disease) (HCC)   High anion gap metabolic acidosis   Closed dislocation of left hip (HCC)   History of anemia due to chronic kidney disease   History of DVT (deep vein thrombosis)   Allergic rhinitis       #) Acute Kidney Injury superimposed on stage IV CKD:  as quantified above.  Potential prerenal contribution in the setting of some clinical evidence to suggest mild dehydration, which were supported by the patient's mild recent decline in oral intake, including that of water, with potential pharmacologic exacerbation in the setting of outpatient gabapentin.  Urinalysis with microscopy has been ordered, with result currently pending.  Plan: monitor strict I's & O's and daily weights. Attempt to avoid nephrotoxic agents.  Hold home gabapentin for now.  Refrain from NSAIDs. Repeat CMP in the morning. Check serum magnesium level.  Follow-up result urinalysis with microscopy.  Add-on random urine sodium and random urine creatinine.  Gentle IV fluids in the form of lactated Ringer's at 75 cc/h x 8 hours. if renal function does not improve with the above measures, can consider obtaining a renal US to evaluate for parenchymal abnormality as well as to assess for  evidence of post-renal obstructive process.         #) Posterior dislocation of the left hip prosthesis: Diagnosed with posterior dislocation of left hip prosthesis after presenting with acute left hip pain following twisting motion of left lower extremity earlier in the evening.  This is in the setting of recent left total hip arthroplasty on 01/23/2022 in the setting of acute left hip fracture at that time.  Successfully reduced under procedural sedation via propofol in the emergency department this evening, as  confirmed by repeat plain films of the left hip, without any radiographic evidence to suggest periprosthetic hip fracture.  Plan: Prn acetaminophen.  Fall precautions ordered.  Physical therapy consult placed.       #) Anion gap metabolic acidosis: Presenting labs reflect anion gap metabolic acidosis, most likely basis of aforementioned acute kidney injury superimposed on CKD 4.  No evidence of underlying infectious process and SIRS criteria not met for sepsis at this time.  Plan: Further evaluation management of acute renal failure superimposed on CKD 4, including gentle overnight IV fluids, as further detailed above.  Repeat CMP in the morning.  Monitor strict I's and O's and daily weights.             #) Anemia of chronic disease: Documented history of such, a/w with baseline hgb range 9-11, with presenting hgb consistent with this range, in the absence of any overt evidence of active bleed, which is notable given her hospitalization a few weeks ago for acute gastrointestinal bleed.     Plan: Repeat CBC in the morning.            #) COPD: Documented history of such, on stiletto Respimat and prn albuterol inhaler as an outpatient.  Clinically, no evidence to suggest acute exacerbation thereof at this time.  Plan: Continue outpatient Stilnoct to Respimat as well as prn albuterol inhaler.                  #) Essential Hypertension: documented h/o such, with outpatient antihypertensive regimen including metoprolol tartrate, amlodipine.  SBP's in the ED today: In the 160s to 180s mmHg, likely with a contribution from acute left hip pain in the setting of presenting posterior dislocation of left hip prosthesis.   Plan: Close monitoring of subsequent BP via routine VS. resume home antihypertensive medications.                #) Hyperlipidemia: documented h/o such. On high intensity atorvastatin as outpatient.    Plan: continue home statin.                #) acquired hypothyroidism: documented h/o such, on Synthroid as outpatient.   Plan: cont home Synthroid.              #) History of right lower extremity DVT/pulmonary embolism: Postoperatively following aforementioned total hip arthroplasty, the patient was diagnosed with right lower extremity DVT during the final week of September 2023, followed by diagnosis of acute pulmonary embolism in September 2023.  She has subsequently been on Eliquis, including resumption of this anticoagulant following ensuing hospitalization for acute gastrointestinal bleeding.  Plan: Continue home Eliquis.          #) Rheumatoid arthritis: Documented history of such, on [place] as an outpatient.  Plan: Continue home [place].           #) Allergic Rhinitis: documented h/o such, on scheduled Nasacort as outpatient.    Plan: cont home Nasacort.  DVT prophylaxis: SCD's plus home Eliquis Code Status: Full code Family Communication: none Disposition Plan: Per Rounding Team Consults called: none;  Admission status: Observation    PLEASE NOTE THAT DRAGON DICTATION SOFTWARE WAS USED IN THE CONSTRUCTION OF THIS NOTE.   [place] DO [facility]  From [place]   03/06/2022, 6:21 AM

## 2022-03-07 ENCOUNTER — Observation Stay (HOSPITAL_COMMUNITY): Payer: Medicare PPO

## 2022-03-07 DIAGNOSIS — E872 Acidosis, unspecified: Secondary | ICD-10-CM | POA: Diagnosis present

## 2022-03-07 DIAGNOSIS — Z951 Presence of aortocoronary bypass graft: Secondary | ICD-10-CM | POA: Diagnosis not present

## 2022-03-07 DIAGNOSIS — E8770 Fluid overload, unspecified: Secondary | ICD-10-CM | POA: Diagnosis present

## 2022-03-07 DIAGNOSIS — Z7951 Long term (current) use of inhaled steroids: Secondary | ICD-10-CM | POA: Diagnosis not present

## 2022-03-07 DIAGNOSIS — Z79890 Hormone replacement therapy (postmenopausal): Secondary | ICD-10-CM | POA: Diagnosis not present

## 2022-03-07 DIAGNOSIS — M353 Polymyalgia rheumatica: Secondary | ICD-10-CM | POA: Diagnosis present

## 2022-03-07 DIAGNOSIS — Z87891 Personal history of nicotine dependence: Secondary | ICD-10-CM | POA: Diagnosis not present

## 2022-03-07 DIAGNOSIS — E876 Hypokalemia: Secondary | ICD-10-CM | POA: Diagnosis present

## 2022-03-07 DIAGNOSIS — I129 Hypertensive chronic kidney disease with stage 1 through stage 4 chronic kidney disease, or unspecified chronic kidney disease: Secondary | ICD-10-CM | POA: Diagnosis present

## 2022-03-07 DIAGNOSIS — D631 Anemia in chronic kidney disease: Secondary | ICD-10-CM | POA: Diagnosis present

## 2022-03-07 DIAGNOSIS — T84021A Dislocation of internal left hip prosthesis, initial encounter: Secondary | ICD-10-CM | POA: Diagnosis present

## 2022-03-07 DIAGNOSIS — N179 Acute kidney failure, unspecified: Secondary | ICD-10-CM | POA: Diagnosis present

## 2022-03-07 DIAGNOSIS — E785 Hyperlipidemia, unspecified: Secondary | ICD-10-CM | POA: Diagnosis present

## 2022-03-07 DIAGNOSIS — E669 Obesity, unspecified: Secondary | ICD-10-CM | POA: Diagnosis present

## 2022-03-07 DIAGNOSIS — E039 Hypothyroidism, unspecified: Secondary | ICD-10-CM | POA: Diagnosis present

## 2022-03-07 DIAGNOSIS — Y831 Surgical operation with implant of artificial internal device as the cause of abnormal reaction of the patient, or of later complication, without mention of misadventure at the time of the procedure: Secondary | ICD-10-CM | POA: Diagnosis present

## 2022-03-07 DIAGNOSIS — S73005A Unspecified dislocation of left hip, initial encounter: Secondary | ICD-10-CM | POA: Diagnosis not present

## 2022-03-07 DIAGNOSIS — N184 Chronic kidney disease, stage 4 (severe): Secondary | ICD-10-CM | POA: Diagnosis present

## 2022-03-07 DIAGNOSIS — Z7901 Long term (current) use of anticoagulants: Secondary | ICD-10-CM | POA: Diagnosis not present

## 2022-03-07 DIAGNOSIS — I714 Abdominal aortic aneurysm, without rupture, unspecified: Secondary | ICD-10-CM | POA: Diagnosis present

## 2022-03-07 DIAGNOSIS — Z8673 Personal history of transient ischemic attack (TIA), and cerebral infarction without residual deficits: Secondary | ICD-10-CM | POA: Diagnosis not present

## 2022-03-07 DIAGNOSIS — J449 Chronic obstructive pulmonary disease, unspecified: Secondary | ICD-10-CM | POA: Diagnosis present

## 2022-03-07 DIAGNOSIS — M069 Rheumatoid arthritis, unspecified: Secondary | ICD-10-CM | POA: Diagnosis present

## 2022-03-07 DIAGNOSIS — J309 Allergic rhinitis, unspecified: Secondary | ICD-10-CM | POA: Diagnosis present

## 2022-03-07 DIAGNOSIS — M25572 Pain in left ankle and joints of left foot: Secondary | ICD-10-CM | POA: Diagnosis not present

## 2022-03-07 DIAGNOSIS — I252 Old myocardial infarction: Secondary | ICD-10-CM | POA: Diagnosis not present

## 2022-03-07 LAB — CBC WITH DIFFERENTIAL/PLATELET
Abs Immature Granulocytes: 0.03 10*3/uL (ref 0.00–0.07)
Basophils Absolute: 0.1 10*3/uL (ref 0.0–0.1)
Basophils Relative: 2 %
Eosinophils Absolute: 0.7 10*3/uL — ABNORMAL HIGH (ref 0.0–0.5)
Eosinophils Relative: 8 %
HCT: 30.7 % — ABNORMAL LOW (ref 36.0–46.0)
Hemoglobin: 10 g/dL — ABNORMAL LOW (ref 12.0–15.0)
Immature Granulocytes: 0 %
Lymphocytes Relative: 20 %
Lymphs Abs: 1.5 10*3/uL (ref 0.7–4.0)
MCH: 30 pg (ref 26.0–34.0)
MCHC: 32.6 g/dL (ref 30.0–36.0)
MCV: 92.2 fL (ref 80.0–100.0)
Monocytes Absolute: 1.3 10*3/uL — ABNORMAL HIGH (ref 0.1–1.0)
Monocytes Relative: 16 %
Neutro Abs: 4.2 10*3/uL (ref 1.7–7.7)
Neutrophils Relative %: 54 %
Platelets: 391 10*3/uL (ref 150–400)
RBC: 3.33 MIL/uL — ABNORMAL LOW (ref 3.87–5.11)
RDW: 17.8 % — ABNORMAL HIGH (ref 11.5–15.5)
WBC: 7.9 10*3/uL (ref 4.0–10.5)
nRBC: 0.4 % — ABNORMAL HIGH (ref 0.0–0.2)

## 2022-03-07 LAB — BASIC METABOLIC PANEL
Anion gap: 14 (ref 5–15)
BUN: 37 mg/dL — ABNORMAL HIGH (ref 8–23)
CO2: 26 mmol/L (ref 22–32)
Calcium: 9 mg/dL (ref 8.9–10.3)
Chloride: 101 mmol/L (ref 98–111)
Creatinine, Ser: 2.23 mg/dL — ABNORMAL HIGH (ref 0.44–1.00)
GFR, Estimated: 21 mL/min — ABNORMAL LOW (ref >60)
Glucose, Bld: 116 mg/dL — ABNORMAL HIGH (ref 70–99)
Potassium: 3.8 mmol/L (ref 3.5–5.1)
Sodium: 141 mmol/L (ref 135–145)

## 2022-03-07 LAB — MAGNESIUM: Magnesium: 2.3 mg/dL (ref 1.7–2.4)

## 2022-03-07 LAB — BRAIN NATRIURETIC PEPTIDE: B Natriuretic Peptide: 467.8 pg/mL — ABNORMAL HIGH (ref 0.0–100.0)

## 2022-03-07 MED ORDER — DICLOFENAC SODIUM 1 % EX GEL
2.0000 g | Freq: Four times a day (QID) | CUTANEOUS | Status: DC
  Administered 2022-03-07 – 2022-03-09 (×7): 2 g via TOPICAL
  Filled 2022-03-07: qty 100

## 2022-03-07 MED ORDER — POTASSIUM CHLORIDE CRYS ER 20 MEQ PO TBCR
40.0000 meq | EXTENDED_RELEASE_TABLET | Freq: Once | ORAL | Status: AC
  Administered 2022-03-07: 40 meq via ORAL
  Filled 2022-03-07: qty 2

## 2022-03-07 MED ORDER — FUROSEMIDE 10 MG/ML IJ SOLN
40.0000 mg | Freq: Two times a day (BID) | INTRAMUSCULAR | Status: AC
  Administered 2022-03-07 (×2): 40 mg via INTRAVENOUS
  Filled 2022-03-07 (×2): qty 4

## 2022-03-07 NOTE — Progress Notes (Signed)
PROGRESS NOTE                                                                                                                                                                                                             Patient Demographics:    [name], is a 81 y.o. female, DOB - [birth year], TMH:[id number]  Outpatient Primary MD for the patient is Plotnikov, [name], MD    LOS - 0  Admit date - 03/06/2022    Chief Complaint  Patient presents with   Hip Pain       Brief Narrative (HPI from H&P)     81 y.o. 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  Hospitalist team was requested to admit the patient.   Subjective:   Patient in bed, appears comfortable, denies any headache, no fever, no chest pain or pressure, no shortness of breath , no abdominal pain. No new focal weakness.   Assessment  & Plan :   Left hip posterior dislocation.  Recent history of left hip arthroplasty, left hip was reduced by the ER physician in the ER on 03/06/2022, and by orthopedics, continue supportive care, posterior hip precautions, PT OT, will require SNF.   AKI on CKD 4 with mild metabolic acidosis.  Massive fluid overload baseline creatinine of around 2, commence Lasix  and monitor.  Unna boots to both lower extremities.  AKI improving with diuresis.  Hypertension.  In poor control.  Placed on combination of Coreg and hydralazine monitor and adjust.  Discontinue Norvasc due to massive lower extremity edema.  Anemia of chronic disease.  No acute issues.  Recent history of LGI diverticular bleed.  On PPI.  Avoid constipation.  Recent history of DVT.  Resume home dose Eliquis.  COPD.  At baseline no wheezing or shortness of breath, supportive care.  Dyslipidemia.  On home dose statin.  Hypothyroidism.  Home dose Synthroid.  Hypomagnesemia.  Replaced.      Condition - Fair  Family Communication  : None present  Code Status : Full code  Consults  :  Ortho  PUD Prophylaxis : PPI   Procedures  :            Disposition Plan  :    Status is: Observation  DVT Prophylaxis  :    apixaban (ELIQUIS) tablet 2.5 mg Start: 03/06/22 1000 SCDs Start: 03/06/22 0557 apixaban (ELIQUIS) tablet 2.5 mg     Lab Results  Component Value Date   PLT 391 03/07/2022    Diet :  Diet Order             Diet regular Room service appropriate? Yes; Fluid consistency: Thin  Diet effective now                    Inpatient Medications  Scheduled Meds:  apixaban  2.5 mg Oral BID   arformoterol  15 mcg Nebulization BID   And   umeclidinium bromide  1 puff Inhalation Daily   atorvastatin  80 mg Oral Daily   budesonide (PULMICORT) nebulizer solution  0.25 mg Nebulization BID   docusate sodium  200 mg Oral BID   famotidine  20 mg Oral Daily   furosemide  40 mg Intravenous BID   hydrALAZINE  100 mg Oral Q8H   leflunomide  20 mg Oral Daily   levothyroxine  88 mcg Oral QAC breakfast   metoprolol tartrate  50 mg Oral BID   pantoprazole  40 mg Oral BID AC   polyethylene glycol  17 g Oral BID   propofol  1 mg/kg Intravenous Once   Continuous Infusions:   PRN Meds:.acetaminophen **OR** acetaminophen, albuterol, hydrALAZINE,  HYDROcodone-acetaminophen    Objective:   Vitals:   03/07/22 0530 03/07/22 0600 03/07/22 0609 03/07/22 0630  BP: (!) 144/56 (!) 141/46  (!) 140/55  Pulse: 64 66  64  Resp: '14 20  15  '$ Temp:   97.6 F (36.4 C)   TempSrc:   Oral   SpO2: 93% 92%  94%  Weight:      Height:        Wt Readings from Last 3 Encounters:  03/06/22 74.8 kg  03/01/22 75.2 kg  02/15/22 72.1 kg     Intake/Output Summary (Last 24 hours) at 03/07/2022 0919 Last data filed at 03/06/2022 1851 Gross per 24 hour  Intake --  Output 1975 ml  Net -1975 ml     Physical Exam  Awake Alert, No new F.N deficits, Normal affect [place].AT,PERRAL Supple Neck, No JVD,   Symmetrical Chest wall movement, Good air movement bilaterally, CTAB RRR,No Gallops, Rubs or new Murmurs,  +ve B.Sounds, Abd Soft, No tenderness,   3+ leg edema, L Knee immobalizer    Data Review:    CBC Recent Labs  Lab 03/06/22 0222 03/07/22 0310  WBC 8.7 7.9  HGB 9.7* 10.0*  HCT 29.2* 30.7*  PLT 378 391  MCV 91.3 92.2  MCH 30.3 30.0  MCHC 33.2 32.6  RDW 17.5* 17.8*  LYMPHSABS 1.8 1.5  MONOABS 2.0* 1.3*  EOSABS 0.4 0.7*  BASOSABS 0.2* 0.1    Electrolytes Recent Labs  Lab 03/06/22 0222 03/07/22 0310  NA 137 141  K 3.5 3.8  CL 100 101  CO2 20* 26  GLUCOSE 116* 116*  BUN 43* 37*  CREATININE 3.02* 2.23*  CALCIUM 9.0 9.0  MG 1.1* 2.3  BNP  --  467.8*     Micro Results No results found for this or any previous visit (from the past 240 hour(s)).  Radiology Reports DG Pelvis Portable  Result Date: 03/06/2022 CLINICAL DATA:  Reduction of dislocation EXAM: PORTABLE PELVIS 1-2 VIEWS COMPARISON:  01/23/2022 FINDINGS: No recent fracture or dislocation is seen. There is previous left hip arthroplasty. There is interval clearing of pockets of air from the soft tissues since 01/23/2022. SI joints are symmetrical. Degenerative changes are noted in visualized lower lumbar spine. IMPRESSION: Previous left hip arthroplasty. No recent  fracture or dislocation is seen. Lumbar spondylosis. Electronically Signed   By: Elmer Picker M.D.   On: 03/06/2022 14:57   DG Chest Port 1 View  Result Date: 03/06/2022 CLINICAL DATA:  Recent hip fracture complicated by DVT and PE, history of COPD. EXAM: PORTABLE CHEST 1 VIEW COMPARISON:  Chest radiograph 02/16/2022 FINDINGS: Median sternotomy wires and mediastinal surgical clips are again seen. The heart is mildly enlarged, unchanged. The upper mediastinal contours are stable. There are small bilateral pleural effusions with adjacent airspace opacities, left worse than right and worsened since 02/16/2022. The upper lobes are well aerated. There is no pneumothorax There is no acute osseous abnormality. IMPRESSION: Small left larger than right pleural effusions with adjacent bibasilar airspace opacities, worsened since 02/16/2022. Electronically Signed   By: Valetta Mole M.D.   On: 03/06/2022 11:24   DG HIP UNILAT WITH PELVIS 1V LEFT  Result Date: 03/06/2022 CLINICAL DATA:  Postreduction left hip.  [id number]. EXAM: DG HIP (WITH OR WITHOUT PELVIS) 1V*L* COMPARISON:  Earlier study today at 1:51 a.m. FINDINGS: Single AP view reveals relocation of the left femoral head prosthesis into the acetabular cup prosthesis. Evidence of fractures is not seen. IMPRESSION: Relocation of the left femoral head prosthesis into the acetabular cup prosthesis. Electronically Signed   By: Telford Nab M.D.   On: 03/06/2022 04:33   DG Hip Unilat With Pelvis 2-3 Views Left  Result Date: 03/06/2022 CLINICAL DATA:  Left hip pain. EXAM: DG HIP (WITH OR WITHOUT PELVIS) 2-3V LEFT COMPARISON:  None Available. FINDINGS: A total left hip replacement is seen with dorsal dislocation of the left femoral prosthesis. There is no evidence of an acute fracture. Soft tissue structures are unremarkable. IMPRESSION: Total left hip replacement with dorsal dislocation of the left femoral prosthesis. Electronically Signed   By: Virgina Norfolk M.D.   On: 03/06/2022 02:11      Signature  Lala Lund M.D on 03/07/2022 at 9:19 AM   -  To page go to [email or web address]

## 2022-03-07 NOTE — Progress Notes (Signed)
Physical Therapy Treatment Patient Details Name: [name] MRN: [id number] DOB: Feb 05, 1941 Today's Date: 03/07/2022   History of Present Illness Pt is 81 yo female admitted 03/06/22 with posterior L hip dislocation and AKI on CKD.  L hip was reduced on 03/06/22 in ED with light sedation. Ortho recommended KI at all time, WBAT, and posterior hip precautions per orders.  Pt with hx including L anterior THA 01/18/22.  Other hx includes CKD, COPD, HLD, hypothyroidism, LE DVT, RA, GI Bleed 10/23.    PT Comments    Pt reports pain in L ankle/foot/lower leg significantly improved this afternoon. She is no longer withdrawing with light palpation.  Does still have significant edema (sitting in dependent position) - encouraged and assisted with elevation with return to bed.  Pt continues to ambulate with min guard but needing min-mod A for transfers with repeated cues for posterior precautions - not able to recall or follow without cues.  Do continue to recommend SNF at this time due to level of assist required and difficulty with posterior precautions.     Recommendations for follow up therapy are one component of a multi-disciplinary discharge planning process, led by the attending physician.  Recommendations may be updated based on patient status, additional functional criteria and insurance authorization.  Follow Up Recommendations  Skilled nursing-short term rehab (<3 hours/day) (potential to progress to HHPT) Can patient physically be transported by private vehicle: Yes   Assistance Recommended at Discharge Intermittent Supervision/Assistance  Patient can return home with the following A little help with walking and/or transfers;A little help with bathing/dressing/bathroom;Assistance with cooking/housework;Assist for transportation;Help with stairs or ramp for entrance   Equipment Recommendations  None recommended by PT    Recommendations for Other Services       Precautions / Restrictions  Precautions Precautions: Fall;Posterior Hip Precaution Booklet Issued: Yes (comment) Precaution Comments: posterior hip precaution handout Required Braces or Orthoses: Knee Immobilizer - Left Knee Immobilizer - Left: Other (comment) (all times except with therapy and CPM) Restrictions Weight Bearing Restrictions: Yes LLE Weight Bearing: Weight bearing as tolerated     Mobility  Bed Mobility Overal bed mobility: Needs Assistance Bed Mobility: Sit to Supine       Sit to supine: Min assist   General bed mobility comments: Min A for LE back to bed    Transfers Overall transfer level: Needs assistance Equipment used: Rolling walker (2 wheels) Transfers: Sit to/from Stand Sit to Stand: Min assist, Mod assist, From elevated surface           General transfer comment: Performed x 5 throughout session with repeated cues for not leaning forward and for foot/hand placement; mod A to rise initially progressing to min A with practice    Ambulation/Gait Ambulation/Gait assistance: Min guard Gait Distance (Feet): 60 Feet Assistive device: Rolling walker (2 wheels) Gait Pattern/deviations: Step-through pattern, Decreased stride length, Decreased weight shift to right       General Gait Details: Antalgic pattern with min cues for RW proximity   Stairs             Wheelchair Mobility    Modified Rankin (Stroke Patients Only)       Balance Overall balance assessment: Needs assistance Sitting-balance support: Feet supported Sitting balance-Leahy Scale: Good     Standing balance support: Reliant on assistive device for balance, Bilateral upper extremity supported Standing balance-Leahy Scale: Poor Standing balance comment: requiring RW; stable with RW  Cognition Arousal/Alertness: Awake/alert Behavior During Therapy: WFL for tasks assessed/performed Overall Cognitive Status: Impaired/Different from baseline Area of  Impairment: Memory, Awareness, Problem solving, Safety/judgement                     Memory: Decreased recall of precautions   Safety/Judgement: Decreased awareness of safety, Decreased awareness of deficits Awareness: Emergent Problem Solving: Slow processing, Requires verbal cues, Requires tactile cues General Comments: Pt recalled 1/3 hip precautions (not leaning forward) but required frequent cues for all throughout session        Exercises      General Comments General comments (skin integrity, edema, etc.): Pt's L ankle and lower leg pain significantly improved this afternoon but still very edematous (increased in foot but pt sitting EOB in dependent position) - helped to elevate and educated on elevating at rest.      Pertinent Vitals/Pain Pain Assessment Pain Assessment: Faces Pain Score: 7  Faces Pain Scale: Hurts a little bit Pain Location: L ankle/foot Pain Descriptors / Indicators: Discomfort Pain Intervention(s): Limited activity within patient's tolerance, Monitored during session    Home Living Family/patient expects to be discharged to:: Private residence Living Arrangements: Spouse/significant other Available Help at Discharge: Family;Available 24 hours/day Type of Home: Independent living facility Home Access: [place]: One level Home Equipment: Conservation officer, nature (2 wheels);Rollator (4 wheels);Cane - single point;Shower seat;Grab bars - tub/shower;Grab bars - toilet;Hand held shower head Additional Comments: Pt in ILF [facility]    Prior Function            PT Goals (current goals can now be found in the care plan section) Progress towards PT goals: Progressing toward goals    Frequency    7X/week      PT Plan Current plan remains appropriate    Co-evaluation              AM-PAC PT "6 Clicks" Mobility   Outcome Measure  Help needed turning from your back to your side while in a flat bed without using  bedrails?: A Little Help needed moving from lying on your back to sitting on the side of a flat bed without using bedrails?: A Lot (mod cues) Help needed moving to and from a bed to a chair (including a wheelchair)?: A Little Help needed standing up from a chair using your arms (e.g., wheelchair or bedside chair)?: A Lot Help needed to walk in hospital room?: A Little Help needed climbing 3-5 steps with a railing? : A Lot 6 Click Score: 15    End of Session Equipment Utilized During Treatment: Gait belt;Left knee immobilizer Activity Tolerance: Patient tolerated treatment well Patient left: in bed;with call bell/phone within reach;with bed alarm set Nurse Communication: Mobility status PT Visit Diagnosis: Difficulty in walking, not elsewhere classified (R26.2);Other abnormalities of gait and mobility (R26.89)     Time: 4665-9935 PT Time Calculation (min) (ACUTE ONLY): 17 min  Charges:  $Therapeutic Activity: 8-22 mins                     Abran Richard, PT Acute Rehab [facility] [phone]    Karlton Lemon 03/07/2022, 3:56 PM

## 2022-03-07 NOTE — Evaluation (Signed)
Occupational Therapy Evaluation Patient Details Name: [name] MRN: [id number] DOB: April 26, 1941 Today's Date: 03/07/2022   History of Present Illness Pt is 81 yo female admitted 03/06/22 with posterior L hip dislocation and AKI on CKD.  L hip was reduced on 03/06/22 in ED with light sedation. Ortho recommended KI at all time, WBAT, and posterior hip precautions per orders.  Pt with hx including L anterior THA 01/18/22.  Other hx includes CKD, COPD, HLD, hypothyroidism, LE DVT, RA, GI Bleed 10/23.   Clinical Impression   PTA, pt lives at [facility], typically Modified Independent with ADLs and mobility using RW. Since THA, pt has been requiring assistance with ADLs. Pt presents now with deficits in cognition, pain, standing balance and overall decreased recall of posterior hip precautions. Pt requires Min-Mod A for bed mobility, min guard for transfers with RW and up to Max A for LB ADLs. Despite education, pt unable to recall posterior hip precautions at start or end of session. If pt moved to floor, would benefit from trial of AE education. Based on new hip precautions and increased risk for dislocation again d/t this, recommend ST SNF rehab at [place].      Recommendations for follow up therapy are one component of a multi-disciplinary discharge planning process, led by the attending physician.  Recommendations may be updated based on patient status, additional functional criteria and insurance authorization.   Follow Up Recommendations  Skilled nursing-short term rehab (<3 hours/day)    Assistance Recommended at Discharge Frequent or constant Supervision/Assistance  Patient can return home with the following A lot of help with bathing/dressing/bathroom;A little help with walking and/or transfers    Functional Status Assessment  Patient has had a recent decline in their functional status and demonstrates the ability to make significant improvements in function in a reasonable and predictable amount of  time.  Equipment Recommendations  None recommended by OT    Recommendations for Other Services       Precautions / Restrictions Precautions Precautions: Fall;Posterior Hip Precaution Booklet Issued: Yes (comment) Precaution Comments: posterior hip precaution handout Required Braces or Orthoses: Knee Immobilizer - Left Knee Immobilizer - Left: Other (comment) (at all times except with therapy and in CPM) Restrictions Weight Bearing Restrictions: Yes LLE Weight Bearing: Weight bearing as tolerated      Mobility Bed Mobility Overal bed mobility: Needs Assistance Bed Mobility: Supine to Sit, Sit to Supine     Supine to sit: Min assist Sit to supine: Mod assist   General bed mobility comments: light assist for LLE to EOB. Pt with difficulty lifting LLE back to bed and required significant LE assist to prevent breaking post hip precautions    Transfers Overall transfer level: Needs assistance Equipment used: Rolling walker (2 wheels) Transfers: Sit to/from Stand Sit to Stand: Min guard                  Balance Overall balance assessment: Needs assistance Sitting-balance support: Feet supported Sitting balance-Leahy Scale: Good     Standing balance support: Reliant on assistive device for balance, Bilateral upper extremity supported Standing balance-Leahy Scale: Poor                             ADL either performed or assessed with clinical judgement   ADL Overall ADL's : Needs assistance/impaired Eating/Feeding: Set up   Grooming: Set up;Sitting   Upper Body Bathing: Set up;Sitting   Lower Body Bathing: Moderate assistance;Sit  to/from stand;Adhering to hip precautions   Upper Body Dressing : Set up;Sitting   Lower Body Dressing: Maximal assistance;Sitting/lateral leans;Sit to/from stand;Adhering to hip precautions Lower Body Dressing Details (indicate cue type and reason): assist for sock mgmt, began education on types of AE to assist with LB  ADLs and maintain precautions Toilet Transfer: Min guard;Rolling walker (2 wheels)   Toileting- Clothing Manipulation and Hygiene: Moderate assistance;Sitting/lateral lean;Sit to/from stand       Functional mobility during ADLs: Min guard;Rolling walker (2 wheels) General ADL Comments: Pt with poor recall of hip precautions, requiring assist for LB ADLs to prevent dislocation and decreased overall awareness for safety     Vision Ability to See in Adequate Light: 0 Adequate Patient Visual Report: No change from baseline Vision Assessment?: No apparent visual deficits     Perception     Praxis      Pertinent Vitals/Pain Pain Assessment Pain Assessment: Faces Faces Pain Scale: Hurts little more Pain Location: L ankle/top of foot Pain Descriptors / Indicators: Grimacing, Guarding, Sore Pain Intervention(s): Monitored during session, Repositioned, Limited activity within patient's tolerance     Hand Dominance Left   Extremity/Trunk Assessment Upper Extremity Assessment Upper Extremity Assessment: Overall WFL for tasks assessed   Lower Extremity Assessment Lower Extremity Assessment: Defer to PT evaluation   Cervical / Trunk Assessment Cervical / Trunk Assessment: Normal   Communication Communication Communication: No difficulties   Cognition Arousal/Alertness: Awake/alert Behavior During Therapy: WFL for tasks assessed/performed Overall Cognitive Status: Impaired/Different from baseline Area of Impairment: Memory, Awareness, Problem solving, Safety/judgement                     Memory: Decreased recall of precautions   Safety/Judgement: Decreased awareness of safety, Decreased awareness of deficits Awareness: Emergent Problem Solving: Slow processing, Requires verbal cues, Requires tactile cues General Comments: repetitively inquiring about need to pee despite education that foley cath in place. pt also unable to recall any posterior hip precautions at start  and end of session (only reports "keep a pillow between my legs").     General Comments  Husband present    Exercises     Shoulder Instructions      Home Living Family/patient expects to be discharged to:: Private residence Living Arrangements: Spouse/significant other Available Help at Discharge: Family;Available 24 hours/day Type of Home: Independent living facility Home Access: [place]: One level     Bathroom Shower/Tub: Teacher, early years/pre: Handicapped height     Home Equipment: Conservation officer, nature (2 wheels);Rollator (4 wheels);Cane - single point;Shower seat;Grab bars - tub/shower;Grab bars - toilet;Hand held shower head   Additional Comments: Pt in ILF at [facility]      Prior Functioning/Environment Prior Level of Function : Independent/Modified Independent             Mobility Comments: Prior to initial hip fracture (9/23) pt ambulated with RW and could ambulate in community; since hip fx could ambulate in hallways at ILF with RW and rest breaks ADLs Comments: Independent with ADLs prior to hip fx 9/23; since injury has had minimal assist with ADLs        OT Problem List: Decreased strength;Impaired balance (sitting and/or standing);Decreased activity tolerance;Decreased cognition;Decreased safety awareness;Decreased knowledge of use of DME or AE;Decreased knowledge of precautions;Pain      OT Treatment/Interventions: Self-care/ADL training;Therapeutic exercise;Energy conservation;DME and/or AE instruction;Therapeutic activities;Patient/family education;Balance training    OT Goals(Current goals can be found in the  care plan section) Acute Rehab OT Goals Patient Stated Goal: husband wants pt to feel better soon, pt would like for L ankle pain to resolve OT Goal Formulation: With patient/family Time For Goal Achievement: 03/21/22 Potential to Achieve Goals: Good ADL Goals Pt Will Perform Lower Body Bathing: with  supervision;sit to/from stand;sitting/lateral leans;with adaptive equipment Pt Will Perform Lower Body Dressing: with supervision;with adaptive equipment;sitting/lateral leans;sit to/from stand Pt Will Transfer to Toilet: with modified independence;ambulating Additional ADL Goal #1: Pt to verbalize 3/3 posterior hip precautions without verbal cues  OT Frequency: Min 2X/week    Co-evaluation              AM-PAC OT "6 Clicks" Daily Activity     Outcome Measure Help from another person eating meals?: None Help from another person taking care of personal grooming?: A Little Help from another person toileting, which includes using toliet, bedpan, or urinal?: A Lot Help from another person bathing (including washing, rinsing, drying)?: A Lot Help from another person to put on and taking off regular upper body clothing?: A Little Help from another person to put on and taking off regular lower body clothing?: A Lot 6 Click Score: 16   End of Session Equipment Utilized During Treatment: Rolling walker (2 wheels) Nurse Communication: Mobility status  Activity Tolerance: Patient tolerated treatment well Patient left: in bed;with call bell/phone within reach;with family/visitor present  OT Visit Diagnosis: Unsteadiness on feet (R26.81);Other abnormalities of gait and mobility (R26.89)                Time: 1207-1226 OT Time Calculation (min): 19 min Charges:  OT General Charges $OT Visit: 1 Visit OT Evaluation $OT Eval Low Complexity: 1 Low  Malachy Chamber, OTR/L Acute Rehab Services Office: [phone]   Layla Maw 03/07/2022, 12:41 PM

## 2022-03-07 NOTE — Progress Notes (Signed)
Pt refuses eloquis r/t past rectal bleeds while taking them

## 2022-03-07 NOTE — Progress Notes (Signed)
Physical Therapy Treatment Patient Details Name: [name] MRN: [id number] DOB: [birth year] Today's Date: 03/07/2022   History of Present Illness Pt is 81 yo female admitted 03/06/22 with posterior L hip dislocation and AKI on CKD.  L hip was reduced on 03/06/22 in ED with light sedation. Ortho recommended KI at all time, WBAT, and posterior hip precautions per orders.  Pt with hx including L anterior THA 01/18/22.  Other hx includes CKD, COPD, HLD, hypothyroidism, LE DVT, RA, GI Bleed 10/23.    PT Comments    Limited treatment due to new c/o L ankle and lower leg pain.  Pt was extremely painful with light palpation ankle and L lateral leg and moderate pain calf and medial leg.  Reports she may have twisted ankle with attempts to stand at home and she states not on her blood thinners.  Also + edema throughout lower leg and unna boots in place.  Notified MD of new findings.  Reports pt is on Eliquis now and will get xray of ankle.  Will f/u later this afternoon as able    Recommendations for follow up therapy are one component of a multi-disciplinary discharge planning process, led by the attending physician.  Recommendations may be updated based on patient status, additional functional criteria and insurance authorization.  Follow Up Recommendations  Skilled nursing-short term rehab (<3 hours/day) (potential to progress to HHPT) Can patient physically be transported by private vehicle: Yes   Assistance Recommended at Discharge Intermittent Supervision/Assistance  Patient can return home with the following A little help with walking and/or transfers;A little help with bathing/dressing/bathroom;Assistance with cooking/housework;Assist for transportation;Help with stairs or ramp for entrance   Equipment Recommendations  None recommended by PT    Recommendations for Other Services       Precautions / Restrictions Precautions Precautions: Fall;Posterior Hip Precaution Booklet Issued: Yes  (comment) Precaution Comments: posterior hip precaution handout Required Braces or Orthoses: Knee Immobilizer - Left Knee Immobilizer - Left: Other (comment) (at all times except w PT or in CPM) Restrictions LLE Weight Bearing: Weight bearing as tolerated     Mobility  Bed Mobility                    Transfers                        Ambulation/Gait                   Stairs             Wheelchair Mobility    Modified Rankin (Stroke Patients Only)       Balance                                            Cognition Arousal/Alertness: Awake/alert Behavior During Therapy: WFL for tasks assessed/performed Overall Cognitive Status: Within Functional Limits for tasks assessed                                          Exercises      General Comments General comments (skin integrity, edema, etc.): Educated on hip precautions again but OOB activity limited due to new c/o L lower leg/ankle pain.  Pt was extremely painful (grimaces, jumps, and withdraws)  with gentle palpation of entire ankle joint and lateral lower leg.  Some pain in calf and medial lower leg with palpation but not as severe.  Pt does have edema throughout lower leg.  She was in unna boots but thought that may be source of pain so RN had cut open at foot, no improvement (RN reports was able to get finger under prior to cutting so not too tight). Pt did report she may have twisted her ankle when attempting to stand at home.  She also reports hx of DVT and states not on her blood thinner.  Messaged MD about new symptoms concerns for fx, sprain, DVT, or just painful from edema.      Pertinent Vitals/Pain Pain Assessment Pain Assessment: 0-10 Pain Score: 7  Pain Location: L foot/ankle/lower leg Pain Descriptors / Indicators: Grimacing, Guarding, Sharp Pain Intervention(s): Limited activity within patient's tolerance, Monitored during session    Home  Living                          Prior Function            PT Goals (current goals can now be found in the care plan section) Progress towards PT goals: Not progressing toward goals - comment (new ankle pain; plan to see again in pm)    Frequency    7X/week (ortho requested bid; did increase to daily)      PT Plan Frequency needs to be updated    Co-evaluation              AM-PAC PT "6 Clicks" Mobility   Outcome Measure  Help needed turning from your back to your side while in a flat bed without using bedrails?: A Little Help needed moving from lying on your back to sitting on the side of a flat bed without using bedrails?: A Little Help needed moving to and from a bed to a chair (including a wheelchair)?: A Little Help needed standing up from a chair using your arms (e.g., wheelchair or bedside chair)?: A Lot Help needed to walk in hospital room?: A Little Help needed climbing 3-5 steps with a railing? : A Lot 6 Click Score: 16    End of Session Equipment Utilized During Treatment: Gait belt;Left knee immobilizer Activity Tolerance: Patient limited by pain Patient left: in bed;with call bell/phone within reach;with bed alarm set Nurse Communication: Mobility status PT Visit Diagnosis: Difficulty in walking, not elsewhere classified (R26.2);Other abnormalities of gait and mobility (R26.89)     Time: 7915-0569 PT Time Calculation (min) (ACUTE ONLY): 18 min  Charges:  $Therapeutic Activity: 8-22 mins                     Abran Richard, PT Acute Rehab [facility] [phone]    Karlton Lemon 03/07/2022, 10:31 AM

## 2022-03-07 NOTE — NC FL2 (Signed)
[place] LEVEL OF CARE SCREENING TOOL     IDENTIFICATION  Patient Name: [name] Birthdate: 02/14/1941 Sex: female Admission Date (Current Location): 03/06/2022  [facility] and [place] Number:      Facility and Address:  [facility], [place] [address], [place], [place] [address]      Provider Number: [id number]  Attending Physician Name and Address:  Thurnell Lose, MD  Relative Name and Phone Number:  Joellen Tullos [phone]    Current Level of Care: Hospital Recommended Level of Care: [place] Prior Approval Number:    Date Approved/Denied:   PASRR Number: [id number]  Discharge Plan: SNF    Current Diagnoses: Patient Active Problem List   Diagnosis Date Noted   AKI (acute kidney injury) ([place]) 03/07/2022   Acute renal failure superimposed on stage 4 chronic kidney disease ([place]) 03/06/2022   High anion gap metabolic acidosis 11/73/5670   Hip dislocation, left ([place]) 03/06/2022   History of anemia due to chronic kidney disease 03/06/2022   History of DVT (deep vein thrombosis) 03/06/2022   Allergic rhinitis 03/06/2022   Lower GI bleed 02/16/2022   Tachycardia 02/06/2022   Pulmonary emboli ([place]) 02/06/2022   Urinary retention 01/30/2022   DVT (deep venous thrombosis) ([place]) 01/30/2022   Vitamin D deficiency 01/30/2022   Slow transit constipation 01/30/2022   GERD (gastroesophageal reflux disease) 01/30/2022   Status post total replacement of right hip 01/29/2022   Status post total replacement of left hip 01/23/2022   Lower GI bleeding 11/18/2021   Hematochezia    Acute blood loss anemia (ABLA)    BRBPR (bright red blood per rectum) 11/17/2021   CKD (chronic kidney disease) stage 4, GFR 15-29 ml/min (HCC) 11/17/2021   Primary osteoarthritis of left hip 10/06/2021   Left hip pain 12/09/2020   Aortic atherosclerosis ([place]) 09/07/2020   Gait disorder 06/08/2020   Neoplasm of uncertain behavior of skin  03/14/2020   Contusion of face 03/10/2020   Arthralgia 12/25/2019   Ankle swelling 08/08/2019   Hyperkalemia 05/15/2019   Osteoporosis 02/26/2018   Colon polyp 06/28/2016   COPD (chronic obstructive pulmonary disease) ([place]) 05/28/2016   Upper airway cough syndrome 05/22/2016   Wrist pain, right 12/14/2015   Lung mass 03/02/2015   Pre-operative cardiovascular examination 02/21/2015   DOE (dyspnea on exertion) 02/16/2015   TIA (transient ischemic attack) 12/31/2014   Abnormal mental state 12/30/2014   Diarrhea 11/26/2014   Well adult exam 11/26/2014   Seasonal allergies 12/31/2013   Splenic artery aneurysm ([place]) 04/07/2013   Cerumen impaction 03/20/2013   Hyperlipidemia    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   Long term (current) use of anticoagulants 11/08/2010   Rheumatoid arthritis ([place]) 06/23/2010   Pain in joint 11/26/2009   Acquired hypothyroidism 12/08/2006   Macular degeneration (senile) of retina 12/08/2006   Essential hypertension 12/08/2006   Diaphragmatic hernia 12/08/2006   ASPLENIA 12/08/2006   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) 12/22/1994   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; now atretic and significantly improved LAD lesion without intervention 12/22/1994    Orientation RESPIRATION BLADDER Height & Weight     Self, Time, Situation, Place  Normal Continent Weight: 165 lb (74.8 kg) Height:  '5\' 3"'$  (160 cm)  BEHAVIORAL SYMPTOMS/MOOD NEUROLOGICAL BOWEL NUTRITION STATUS      Continent Diet (See discharge summary)  AMBULATORY STATUS COMMUNICATION OF NEEDS Skin  Limited Assist Verbally Normal, Surgical wounds                       Personal Care Assistance Level of Assistance  Bathing, Feeding, Dressing Bathing Assistance: Limited assistance Feeding assistance: Independent Dressing Assistance: Limited assistance     Functional Limitations Info  Sight, Hearing, Speech Sight  Info: Impaired Hearing Info: Adequate Speech Info: Adequate    SPECIAL CARE FACTORS FREQUENCY  PT (By licensed PT), OT (By licensed OT)     PT Frequency: 5x weekly OT Frequency: 5x weekly            Contractures Contractures Info: Not present    Additional Factors Info  Code Status, Allergies Code Status Info: Full Allergies Info: Nsaids, Aspirin, Codeine, Nitrostat (Nitroglycerin)           Current Medications (03/07/2022):  This is the current hospital active medication list Current Facility-Administered Medications  Medication Dose Route Frequency Provider Last Rate Last Admin   acetaminophen (TYLENOL) tablet 650 mg  650 mg Oral Q6H PRN Howerter, Justin B, DO   650 mg at 03/06/22 2312   Or   acetaminophen (TYLENOL) suppository 650 mg  650 mg Rectal Q6H PRN Howerter, Justin B, DO       albuterol (PROVENTIL) (2.5 MG/3ML) 0.083% nebulizer solution 3 mL  3 mL Inhalation Q4H PRN Howerter, Justin B, DO       apixaban (ELIQUIS) tablet 2.5 mg  2.5 mg Oral BID Howerter, Justin B, DO       arformoterol (BROVANA) nebulizer solution 15 mcg  15 mcg Nebulization BID Howerter, Justin B, DO   15 mcg at 03/07/22 0800   And   umeclidinium bromide (INCRUSE ELLIPTA) 62.5 MCG/ACT 1 puff  1 puff Inhalation Daily Howerter, Justin B, DO   1 puff at 03/07/22 0806   atorvastatin (LIPITOR) tablet 80 mg  80 mg Oral Daily Howerter, Justin B, DO   80 mg at 03/07/22 0918   budesonide (PULMICORT) nebulizer solution 0.25 mg  0.25 mg Nebulization BID Howerter, Justin B, DO   0.25 mg at 03/07/22 0800   diclofenac Sodium (VOLTAREN) 1 % topical gel 2 g  2 g Topical QID Thurnell Lose, MD   2 g at 03/07/22 1140   docusate sodium (COLACE) capsule 200 mg  200 mg Oral BID Thurnell Lose, MD   200 mg at 03/07/22 0918   famotidine (PEPCID) tablet 20 mg  20 mg Oral Daily Howerter, Justin B, DO   20 mg at 03/07/22 0919   furosemide (LASIX) injection 40 mg  40 mg Intravenous BID Thurnell Lose, MD   40 mg at  03/07/22 0654   hydrALAZINE (APRESOLINE) injection 10 mg  10 mg Intravenous Q6H PRN Thurnell Lose, MD       hydrALAZINE (APRESOLINE) tablet 100 mg  100 mg Oral Q8H Thurnell Lose, MD   100 mg at 03/07/22 1323   HYDROcodone-acetaminophen (NORCO/VICODIN) 5-325 MG per tablet 1 tablet  1 tablet Oral Q6H PRN Thurnell Lose, MD   1 tablet at 03/07/22 0319   leflunomide (ARAVA) tablet 20 mg  20 mg Oral Daily Howerter, Justin B, DO   20 mg at 03/07/22 1139   levothyroxine (SYNTHROID) tablet 88 mcg  88 mcg Oral QAC breakfast Howerter, Justin B, DO   88 mcg at 03/07/22 0515   metoprolol tartrate (LOPRESSOR) tablet 50 mg  50 mg Oral BID Howerter, Justin B, DO   50 mg  at 03/07/22 0918   pantoprazole (PROTONIX) EC tablet 40 mg  40 mg Oral BID AC Thurnell Lose, MD   40 mg at 03/07/22 0759   polyethylene glycol (MIRALAX / GLYCOLAX) packet 17 g  17 g Oral BID Thurnell Lose, MD       propofol (DIPRIVAN) 10 mg/mL bolus/IV push 74.8 mg  1 mg/kg Intravenous Once Maudie Flakes, MD       Current Outpatient Medications  Medication Sig Dispense Refill   acetaminophen (TYLENOL) 325 MG tablet Take 650 mg by mouth 2 (two) times daily as needed (generalized pain).     albuterol (VENTOLIN HFA) 108 (90 Base) MCG/ACT inhaler Inhale 1-2 puffs into the lungs every 4 (four) hours as needed for wheezing or shortness of breath.     amLODipine (NORVASC) 2.5 MG tablet Take 1 tablet (2.5 mg total) by mouth daily. 90 tablet 2   ascorbic acid (VITAMIN C) 500 MG tablet Take 500 mg by mouth daily.     atorvastatin (LIPITOR) 80 MG tablet Take 80 mg by mouth daily.     Cholecalciferol (VITAMIN D3) 50 MCG (2000 UT) capsule Take 1 capsule (2,000 Units total) by mouth daily. 100 capsule 3   Cyanocobalamin 2500 MCG TABS Take 2,500 mcg by mouth daily.     docusate sodium (COLACE) 100 MG capsule Take 1-2 capsules (100-200 mg total) by mouth daily as needed for mild constipation. 100 capsule 3   famotidine (PEPCID) 20 MG  tablet Take 20 mg by mouth daily.     gabapentin (NEURONTIN) 300 MG capsule Take 1 capsule (300 mg total) by mouth at bedtime.     leflunomide (ARAVA) 20 MG tablet Take 20 mg by mouth daily.      levothyroxine (SYNTHROID) 88 MCG tablet TAKE 1 TABLET (88 MCG TOTAL) BY MOUTH DAILY BEFORE BREAKFAST. 90 tablet 3   metoprolol tartrate (LOPRESSOR) 50 MG tablet Take 50 mg by mouth 2 (two) times daily.     Multiple Vitamins-Minerals (CENTRUM ADULT PO) Take 1 tablet by mouth daily. 1 Tablet Daily.     pantoprazole (PROTONIX) 40 MG tablet Take 40 mg by mouth daily.     Tiotropium Bromide-Olodaterol (STIOLTO RESPIMAT) 2.5-2.5 MCG/ACT AERS Inhale 2 each into the lungs at bedtime.     torsemide (DEMADEX) 20 MG tablet Take 1-2 tablets (20-40 mg total) by mouth daily as needed. (Patient taking differently: Take 20-40 mg by mouth daily as needed (fluid).) 60 tablet 1   triamcinolone (NASACORT) 55 MCG/ACT AERO nasal inhaler Place 1 spray into the nose daily.     apixaban (ELIQUIS) 2.5 MG TABS tablet Take 1 tablet (2.5 mg total) by mouth 2 (two) times daily. 60 tablet 5   Golimumab 50 MG/0.5ML SOAJ Take 50 mg by mouth every 2 (two) months. Hold for the next 2 weeks per Orthopedic surgery recommendations. 0.3 mL    hydrocortisone (ANUSOL-HC) 2.5 % rectal cream Place rectally 2 (two) times daily. (Patient not taking: Reported on 03/06/2022) 30 g 0     Discharge Medications: Please see discharge summary for a list of discharge medications.  Relevant Imaging Results:  Relevant Lab Results:   Additional Information SSN: [id number]  Archie Endo, LCSW

## 2022-03-07 NOTE — Care Management Obs Status (Signed)
[facility] NOTIFICATION   Patient Details  Name: NAYLANI BRADNER MRN: [id number] Date of Birth: October 04, 1940   Medicare Observation Status Notification Given:  Yes    Fuller Mandril, RN 03/07/2022, 9:27 AM

## 2022-03-08 DIAGNOSIS — S73005A Unspecified dislocation of left hip, initial encounter: Secondary | ICD-10-CM | POA: Diagnosis not present

## 2022-03-08 DIAGNOSIS — N179 Acute kidney failure, unspecified: Secondary | ICD-10-CM | POA: Diagnosis not present

## 2022-03-08 LAB — CBC WITH DIFFERENTIAL/PLATELET
Abs Immature Granulocytes: 0.03 10*3/uL (ref 0.00–0.07)
Basophils Absolute: 0.2 10*3/uL — ABNORMAL HIGH (ref 0.0–0.1)
Basophils Relative: 2 %
Eosinophils Absolute: 1 10*3/uL — ABNORMAL HIGH (ref 0.0–0.5)
Eosinophils Relative: 10 %
HCT: 28.4 % — ABNORMAL LOW (ref 36.0–46.0)
Hemoglobin: 9.6 g/dL — ABNORMAL LOW (ref 12.0–15.0)
Immature Granulocytes: 0 %
Lymphocytes Relative: 22 %
Lymphs Abs: 2.2 10*3/uL (ref 0.7–4.0)
MCH: 29.8 pg (ref 26.0–34.0)
MCHC: 33.8 g/dL (ref 30.0–36.0)
MCV: 88.2 fL (ref 80.0–100.0)
Monocytes Absolute: 1.6 10*3/uL — ABNORMAL HIGH (ref 0.1–1.0)
Monocytes Relative: 16 %
Neutro Abs: 4.9 10*3/uL (ref 1.7–7.7)
Neutrophils Relative %: 50 %
Platelets: 417 10*3/uL — ABNORMAL HIGH (ref 150–400)
RBC: 3.22 MIL/uL — ABNORMAL LOW (ref 3.87–5.11)
RDW: 17.9 % — ABNORMAL HIGH (ref 11.5–15.5)
WBC: 9.8 10*3/uL (ref 4.0–10.5)
nRBC: 0.4 % — ABNORMAL HIGH (ref 0.0–0.2)

## 2022-03-08 LAB — BASIC METABOLIC PANEL
Anion gap: 12 (ref 5–15)
BUN: 40 mg/dL — ABNORMAL HIGH (ref 8–23)
CO2: 28 mmol/L (ref 22–32)
Calcium: 9.4 mg/dL (ref 8.9–10.3)
Chloride: 100 mmol/L (ref 98–111)
Creatinine, Ser: 2.44 mg/dL — ABNORMAL HIGH (ref 0.44–1.00)
GFR, Estimated: 19 mL/min — ABNORMAL LOW (ref >60)
Glucose, Bld: 105 mg/dL — ABNORMAL HIGH (ref 70–99)
Potassium: 3.6 mmol/L (ref 3.5–5.1)
Sodium: 140 mmol/L (ref 135–145)

## 2022-03-08 LAB — MAGNESIUM: Magnesium: 1.9 mg/dL (ref 1.7–2.4)

## 2022-03-08 LAB — BRAIN NATRIURETIC PEPTIDE: B Natriuretic Peptide: 371.8 pg/mL — ABNORMAL HIGH (ref 0.0–100.0)

## 2022-03-08 LAB — GLUCOSE, CAPILLARY: Glucose-Capillary: 113 mg/dL — ABNORMAL HIGH (ref 70–99)

## 2022-03-08 MED ORDER — FUROSEMIDE 10 MG/ML IJ SOLN
40.0000 mg | Freq: Once | INTRAMUSCULAR | Status: AC
  Administered 2022-03-08: 40 mg via INTRAVENOUS
  Filled 2022-03-08: qty 4

## 2022-03-08 NOTE — Progress Notes (Signed)
PROGRESS NOTE                                                                                                                                                                                                             Patient Demographics:    [name], is a 81 y.o. female, DOB - 1940-10-28, DJM:[id number]  Outpatient Primary MD for the patient is Plotnikov, [name], MD    LOS - 1  Admit date - 03/06/2022    Chief Complaint  Patient presents with   Hip Pain       Brief Narrative (HPI from H&P)     81 y.o. 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  Hospitalist team was requested to admit the patient.   Subjective:   Patient in bed, appears comfortable, denies any headache, no fever, no chest pain or pressure, no shortness of breath , no abdominal pain. No new focal weakness.   Assessment  & Plan :   Left hip posterior dislocation.  Recent history of left hip arthroplasty, left hip was reduced by the ER physician in the ER on 03/06/2022, and by orthopedics, continue supportive care, posterior hip precautions, PT OT, will require SNF.   AKI on CKD 4 with mild metabolic acidosis.  Massive fluid overload baseline creatinine of around 2.2, commence Lasix  and monitor.  Unna boots to both lower extremities.     Hypertension.  In poor control.  Placed on combination of Coreg and hydralazine monitor and adjust.  Discontinue Norvasc due to massive lower extremity edema.  Anemia of chronic disease.  No acute issues.  Recent history of LGI diverticular bleed.  On PPI.  Avoid constipation.  Recent history of DVT.  Resume home dose Eliquis.  COPD.  At baseline no wheezing or shortness of breath, supportive care.  Dyslipidemia.  On home dose statin.  Hypothyroidism.  Home dose Synthroid.  Hypomagnesemia.  Replaced.      Condition - Fair  Family Communication  : None present  Code Status : Full code  Consults  :  Ortho  PUD Prophylaxis : PPI   Procedures  :            Disposition Plan  :    Status is: Inpt >> SNF  DVT Prophylaxis  :    apixaban (ELIQUIS) tablet 2.5 mg Start: 03/06/22 1000 SCDs Start: 03/06/22 0557 apixaban (ELIQUIS) tablet 2.5 mg     Lab Results  Component Value Date   PLT 417 (H) 03/08/2022    Diet :  Diet Order             Diet regular Room service appropriate? Yes; Fluid consistency: Thin  Diet effective now                    Inpatient Medications  Scheduled Meds:  apixaban  2.5 mg Oral BID   arformoterol  15 mcg Nebulization BID   And   umeclidinium bromide  1 puff Inhalation Daily   atorvastatin  80 mg Oral Daily   budesonide (PULMICORT) nebulizer solution  0.25 mg Nebulization BID   diclofenac Sodium  2 g Topical QID   docusate sodium  200 mg Oral BID   famotidine  20 mg Oral Daily   hydrALAZINE  100 mg Oral Q8H   leflunomide  20 mg Oral Daily   levothyroxine  88 mcg Oral QAC breakfast   metoprolol tartrate  50 mg Oral BID   pantoprazole  40 mg Oral BID AC   polyethylene glycol  17 g Oral BID   propofol  1 mg/kg Intravenous Once   Continuous Infusions:   PRN Meds:.acetaminophen **OR** acetaminophen, albuterol, hydrALAZINE, HYDROcodone-acetaminophen    Objective:    Vitals:   03/08/22 0537 03/08/22 0606 03/08/22 0820 03/08/22 0822  BP: (!) 140/44     Pulse: 79     Resp:      Temp:      TempSrc:      SpO2:   93% 93%  Weight:  69.2 kg    Height:        Wt Readings from Last 3 Encounters:  03/08/22 69.2 kg  03/01/22 75.2 kg  02/15/22 72.1 kg     Intake/Output Summary (Last 24 hours) at 03/08/2022 1020 Last data filed at 03/08/2022 0800 Gross per 24 hour  Intake 224 ml  Output 1700 ml  Net -1476 ml     Physical Exam  Awake Alert, No new F.N deficits, Normal affect [place].AT,PERRAL Supple Neck, No JVD,   Symmetrical Chest wall movement, Good air movement bilaterally, CTAB RRR,No Gallops, Rubs or new Murmurs,  +ve B.Sounds, Abd Soft, No tenderness,   2+ leg edema, L Knee immobalizer    Data Review:    CBC Recent Labs  Lab 03/06/22 0222 03/07/22 0310 03/08/22 0650  WBC 8.7 7.9 9.8  HGB 9.7* 10.0* 9.6*  HCT 29.2* 30.7* 28.4*  PLT 378 391 417*  MCV 91.3 92.2 88.2  MCH 30.3 30.0 29.8  MCHC 33.2 32.6 33.8  RDW 17.5* 17.8* 17.9*  LYMPHSABS 1.8 1.5 2.2  MONOABS 2.0* 1.3* 1.6*  EOSABS 0.4 0.7* 1.0*  BASOSABS 0.2* 0.1 0.2*    Electrolytes Recent Labs  Lab 03/06/22 0222 03/07/22 0310 03/08/22 0650  NA  137 141 140  K 3.5 3.8 3.6  CL 100 101 100  CO2 20* 26 28  GLUCOSE 116* 116* 105*  BUN 43* 37* 40*  CREATININE 3.02* 2.23* 2.44*  CALCIUM 9.0 9.0 9.4  MG 1.1* 2.3 1.9  BNP  --  467.8* 371.8*     Micro Results No results found for this or any previous visit (from the past 240 hour(s)).  Radiology Reports DG Ankle Complete Left  Result Date: 03/07/2022 CLINICAL DATA:  Left ankle pain.  Hip dislocation. EXAM: LEFT ANKLE COMPLETE - 3+ VIEW COMPARISON:  None Available. FINDINGS: There is no evidence of fracture, dislocation, or joint effusion. There is no evidence of arthropathy or other focal bone abnormality. Soft tissues are unremarkable. Partially imaged splint and bandage over the distal leg/ankle. IMPRESSION: No  acute fracture or dislocation. Electronically Signed   By: Keane Police D.O.   On: 03/07/2022 10:37   DG Pelvis Portable  Result Date: 03/06/2022 CLINICAL DATA:  Reduction of dislocation EXAM: PORTABLE PELVIS 1-2 VIEWS COMPARISON:  01/23/2022 FINDINGS: No recent fracture or dislocation is seen. There is previous left hip arthroplasty. There is interval clearing of pockets of air from the soft tissues since 01/23/2022. SI joints are symmetrical. Degenerative changes are noted in visualized lower lumbar spine. IMPRESSION: Previous left hip arthroplasty. No recent fracture or dislocation is seen. Lumbar spondylosis. Electronically Signed   By: Elmer Picker M.D.   On: 03/06/2022 14:57   DG Chest Port 1 View  Result Date: 03/06/2022 CLINICAL DATA:  Recent hip fracture complicated by DVT and PE, history of COPD. EXAM: PORTABLE CHEST 1 VIEW COMPARISON:  Chest radiograph 02/16/2022 FINDINGS: Median sternotomy wires and mediastinal surgical clips are again seen. The heart is mildly enlarged, unchanged. The upper mediastinal contours are stable. There are small bilateral pleural effusions with adjacent airspace opacities, left worse than right and worsened since 02/16/2022. The upper lobes are well aerated. There is no pneumothorax There is no acute osseous abnormality. IMPRESSION: Small left larger than right pleural effusions with adjacent bibasilar airspace opacities, worsened since 02/16/2022. Electronically Signed   By: Valetta Mole M.D.   On: 03/06/2022 11:24   DG HIP UNILAT WITH PELVIS 1V LEFT  Result Date: 03/06/2022 CLINICAL DATA:  Postreduction left hip.  [id number]. EXAM: DG HIP (WITH OR WITHOUT PELVIS) 1V*L* COMPARISON:  Earlier study today at 1:51 a.m. FINDINGS: Single AP view reveals relocation of the left femoral head prosthesis into the acetabular cup prosthesis. Evidence of fractures is not seen. IMPRESSION: Relocation of the left femoral head prosthesis into the acetabular cup prosthesis.  Electronically Signed   By: Telford Nab M.D.   On: 03/06/2022 04:33   DG Hip Unilat With Pelvis 2-3 Views Left  Result Date: 03/06/2022 CLINICAL DATA:  Left hip pain. EXAM: DG HIP (WITH OR WITHOUT PELVIS) 2-3V LEFT COMPARISON:  None Available. FINDINGS: A total left hip replacement is seen with dorsal dislocation of the left femoral prosthesis. There is no evidence of an acute fracture. Soft tissue structures are unremarkable. IMPRESSION: Total left hip replacement with dorsal dislocation of the left femoral prosthesis. Electronically Signed   By: Virgina Norfolk M.D.   On: 03/06/2022 02:11      Signature  Lala Lund M.D on 03/08/2022 at 10:20 AM   -  To page go to [email or web address]

## 2022-03-08 NOTE — Progress Notes (Signed)
Physical Therapy Treatment Patient Details Name: [name] MRN: [id number] DOB: 09/29/1940 Today's Date: 03/08/2022   History of Present Illness Pt is 81 yo female admitted 03/06/22 with posterior L hip dislocation and AKI on CKD.  L hip was reduced on 03/06/22 in ED with light sedation. Ortho recommended KI at all time, WBAT, and posterior hip precautions per orders.  Pt with hx including L anterior THA 01/18/22.  Other hx includes CKD, COPD, HLD, hypothyroidism, LE DVT, RA, GI Bleed 10/23.    PT Comments    Continuing work on functional mobility and activity tolerance;  Session focused on education for Post Hip prec during functional mobility, emphasizing the need to follow to decr risk of dislocation again; Posted precautions throughout the room as well;  Repositioned KI for comfort; Able to walk in the hallway with min/minguard assist; needs mod cues for post hip prec with turns;   Making good (slow, but steady) progress; continue to recommend post-acute rehab at SNF level (perhaps at [facility]?) to maximize independence and safety with mobility and especially ADLs with post hip precautions; Overall progressing well; Anticipate continuing good progress at post-acute rehabilitation.   Recommendations for follow up therapy are one component of a multi-disciplinary discharge planning process, led by the attending physician.  Recommendations may be updated based on patient status, additional functional criteria and insurance authorization.  Follow Up Recommendations  Skilled nursing-short term rehab (<3 hours/day) Can patient physically be transported by private vehicle: Yes   Assistance Recommended at Discharge Intermittent Supervision/Assistance  Patient can return home with the following A little help with walking and/or transfers;A little help with bathing/dressing/bathroom;Assistance with cooking/housework;Assist for transportation;Help with stairs or ramp for entrance   Equipment  Recommendations  None recommended by PT    Recommendations for Other Services       Precautions / Restrictions Precautions Precautions: Fall;Posterior Hip Precaution Booklet Issued: Yes (comment) Precaution Comments: posterior hip precaution handout Required Braces or Orthoses: Knee Immobilizer - Left Knee Immobilizer - Left: Other (comment) (all times except with therapy and CPM) Restrictions LLE Weight Bearing: Weight bearing as tolerated     Mobility  Bed Mobility Overal bed mobility: Needs Assistance Bed Mobility: Supine to Sit     Supine to sit: Min assist     General bed mobility comments: Min assist to square off hips at EOB    Transfers Overall transfer level: Needs assistance Equipment used: Rolling walker (2 wheels) Transfers: Sit to/from Stand Sit to Stand: Mod assist           General transfer comment: Cues for hand placement and safety, as well as to pre-position for post hip precautions; Heavy mod assist to power up    Ambulation/Gait Ambulation/Gait assistance: Min guard Gait Distance (Feet): 65 Feet Assistive device: Rolling walker (2 wheels) Gait Pattern/deviations: Step-through pattern, Decreased stride length, Decreased weight shift to right Gait velocity: Decreased     General Gait Details: Antalgic pattern with min cues for RW proximity and for posterior hip prec with turns   Stairs             Wheelchair Mobility    Modified Rankin (Stroke Patients Only)       Balance     Sitting balance-Leahy Scale: Good     Standing balance support: Reliant on assistive device for balance, Bilateral upper extremity supported Standing balance-Leahy Scale: Poor Standing balance comment: requiring RW; stable with RW  Cognition Arousal/Alertness: Awake/alert Behavior During Therapy: WFL for tasks assessed/performed Overall Cognitive Status: Impaired/Different from baseline Area of Impairment:  Memory                               General Comments: Pt recalled 1/3 hip precautions (not leaning forward) but required frequent cues for all throughout session        Exercises Total Joint Exercises Ankle Circles/Pumps: AROM, Both, 10 reps Quad Sets: AROM, Both, 10 reps, Supine    General Comments General comments (skin integrity, edema, etc.): Pt reports her feet look better, less edematous than yesterday      Pertinent Vitals/Pain Pain Assessment Pain Assessment: Faces Faces Pain Scale: Hurts a little bit Pain Location: L LLE, ankle/foot Pain Descriptors / Indicators: Discomfort Pain Intervention(s): Monitored during session    Home Living                          Prior Function            PT Goals (current goals can now be found in the care plan section) Acute Rehab PT Goals Patient Stated Goal: return home PT Goal Formulation: With patient/family Time For Goal Achievement: 03/20/22 Potential to Achieve Goals: Good Progress towards PT goals: Progressing toward goals    Frequency    7X/week      PT Plan Current plan remains appropriate    Co-evaluation              AM-PAC PT "6 Clicks" Mobility   Outcome Measure  Help needed turning from your back to your side while in a flat bed without using bedrails?: A Little Help needed moving from lying on your back to sitting on the side of a flat bed without using bedrails?: A Lot (mod cues) Help needed moving to and from a bed to a chair (including a wheelchair)?: A Little Help needed standing up from a chair using your arms (e.g., wheelchair or bedside chair)?: A Lot Help needed to walk in hospital room?: A Little Help needed climbing 3-5 steps with a railing? : A Lot 6 Click Score: 15    End of Session Equipment Utilized During Treatment: Gait belt;Left knee immobilizer Activity Tolerance: Patient tolerated treatment well Patient left: in chair;with call bell/phone within  reach Nurse Communication: Mobility status PT Visit Diagnosis: Difficulty in walking, not elsewhere classified (R26.2);Other abnormalities of gait and mobility (R26.89)     Time: 2585-2778 PT Time Calculation (min) (ACUTE ONLY): 33 min  Charges:  $Gait Training: 8-22 mins $Therapeutic Activity: 8-22 mins                     Roney Marion, [place] [facility] [place] 03/08/2022, 12:49 PM

## 2022-03-08 NOTE — Progress Notes (Signed)
Subjective:    Patient reports pain as mild.  Feeling well this am  Objective: Vital signs in last 24 hours: Temp:  [97.5 F (36.4 C)-98.7 F (37.1 C)] 98 F (36.7 C) (11/08 0515) Pulse Rate:  [73-83] 79 (11/08 0537) Resp:  [16-18] 17 (11/08 0515) BP: (113-162)/(42-131) 140/44 (11/08 0537) SpO2:  [93 %-96 %] 93 % (11/08 0515) Weight:  [69.2 kg] 69.2 kg (11/08 0606)  Intake/Output from previous day: 11/07 0701 - 11/08 0700 In: 4 [I.V.:4] Out: 1700 [Urine:1700] Intake/Output this shift: No intake/output data recorded.  Recent Labs    03/06/22 0222 03/07/22 0310 03/08/22 0650  HGB 9.7* 10.0* 9.6*   Recent Labs    03/07/22 0310 03/08/22 0650  WBC 7.9 9.8  RBC 3.33* 3.22*  HCT 30.7* 28.4*  PLT 391 417*   Recent Labs    03/06/22 0222 03/07/22 0310  NA 137 141  K 3.5 3.8  CL 100 101  CO2 20* 26  BUN 43* 37*  CREATININE 3.02* 2.23*  GLUCOSE 116* 116*  CALCIUM 9.0 9.0   No results for input(s): "LABPT", "INR" in the last 72 hours.  Neurologically intact Neurovascular intact Sensation intact distally Intact pulses distally Dorsiflexion/Plantar flexion intact Incision: dressing C/D/I No cellulitis present Compartment soft KI in place   Assessment/Plan:    PLAN WBAT LLE- knee immobilizer at all times.  Posterior hip precautions Ankle xrays-negative F/u with Dr. Erlinda Hong in two weeks      Aundra Dubin 03/08/2022, 7:51 AM

## 2022-03-08 NOTE — Consult Note (Signed)
   [facility] Inpatient Consult   03/08/2022  [name] Mar 20, 1941 [id number] [place] Organization [ACO] Patient: [name] PPO  Primary Care Provider:  Cassandria Anger, MD, [place] [facility]  Patient is currently active with [facility] for chronic disease management services.  Patient has been engaged by a [place].  Our community based plan of care has focused on disease management and community resource support.   1420: Met with patient an son, [name] and spoke about ongoing need for rehab since dislocation of hip and now with renal issues being assessed.  Patient will receive a post hospital call and will be evaluated for assessments and disease process education.    Plan: Continue to follow up. Patient is from [facility].  Of note, [facility] Care Management services does not replace or interfere with any services that are needed or arranged by inpatient [facility] care management team.   For additional questions or referrals please contact:  Sherry Brood, RN BSN [place]  [phone] business mobile phone Toll free office [phone]  *[place]  [phone] Fax number: [phone] [email or web address]

## 2022-03-08 NOTE — Discharge Instructions (Signed)
Knee immobilizer to left leg at all times.  Posterior hip precautions.  F/u with Dr. Erlinda Hong in two weeks.  Follow with Primary MD Plotnikov, [name], MD in 7 days   Get CBC, CMP, 2 view Chest X ray -  checked next visit with your primary MD or SNF MD    Activity: As tolerated with Full fall precautions use walker/cane & assistance as needed  Disposition Home    Diet: Heart Healthy    Special Instructions: If you have smoked or chewed Tobacco  in the last 2 yrs please stop smoking, stop any regular Alcohol  and or any Recreational drug use.  On your next visit with your primary care physician please Get Medicines reviewed and adjusted.  Please request your Prim.MD to go over all Hospital Tests and Procedure/Radiological results at the follow up, please get all Hospital records sent to your Prim MD by signing hospital release before you go home.  If you experience worsening of your admission symptoms, develop shortness of breath, life threatening emergency, suicidal or homicidal thoughts you must seek medical attention immediately by calling 911 or calling your MD immediately  if symptoms less severe.  You Must read complete instructions/literature along with all the possible adverse reactions/side effects for all the Medicines you take and that have been prescribed to you. Take any new Medicines after you have completely understood and accpet all the possible adverse reactions/side effects.

## 2022-03-08 NOTE — TOC Initial Note (Signed)
Transition of Care [facility]) - Initial/Assessment Note    Patient Details  Name: [name] MRN: [id number] Date of Birth: 22-Nov-1940  Transition of Care [facility]) CM/SW Contact:    Milinda Antis, [place] Phone Number: 03/08/2022, 5:06 PM  Clinical Narrative:                 CSW received consult for possible SNF placement at time of discharge. CSW spoke with patient.  Patient expressed understanding of PT recommendation and is agreeable to SNF placement at time of discharge. Patient reports preference for [facility] as patient is already in independent Living at the facility. CSW discussed insurance authorization process.   LCSW contacted Baxter Hire, social worker at [employer], [phone]) and confirmed that the patient can go to rehab at the facility when insurance Josem Kaufmann is received.  Insurance auth pending.   Expected Discharge Plan: [place]     Patient Goals and CMS Choice        Expected Discharge Plan and Services Expected Discharge Plan: [place] In-house Referral: Clinical Social Work                                            Prior Living Arrangements/Services     Patient language and need for interpreter reviewed:: Yes        Need for Family Participation in Patient Care: Yes (Comment) Care giver support system in place?: Yes (comment)   Criminal Activity/Legal Involvement Pertinent to Current Situation/Hospitalization: No - Comment as needed  Activities of Daily Living      Permission Sought/Granted   Permission granted to share information with : Yes, Release of Information Signed     Permission granted to share info w AGENCY: SNF        Emotional Assessment Appearance:: Appears stated age Attitude/Demeanor/Rapport: Engaged Affect (typically observed): Accepting, Pleasant Orientation: : Oriented to Situation, Oriented to  Time, Oriented to Place, Oriented to Self Alcohol /  Substance Use: Not Applicable Psych Involvement: No (comment)  Admission diagnosis:  AKI (acute kidney injury) ([place]) [N17.9] Acute kidney injury ([place]) [N17.9] Dislocation of left hip, initial encounter ([place]) [S73.005A] Patient Active Problem List   Diagnosis Date Noted   AKI (acute kidney injury) ([place]) 03/07/2022   Acute renal failure superimposed on stage 4 chronic kidney disease ([place]) 03/06/2022   High anion gap metabolic acidosis 66/44/0347   Hip dislocation, left ([place]) 03/06/2022   History of anemia due to chronic kidney disease 03/06/2022   History of DVT (deep vein thrombosis) 03/06/2022   Allergic rhinitis 03/06/2022   Lower GI bleed 02/16/2022   Tachycardia 02/06/2022   Pulmonary emboli ([place]) 02/06/2022   Urinary retention 01/30/2022   DVT (deep venous thrombosis) ([place]) 01/30/2022   Vitamin D deficiency 01/30/2022   Slow transit constipation 01/30/2022   GERD (gastroesophageal reflux disease) 01/30/2022   Status post total replacement of right hip 01/29/2022   Status post total replacement of left hip 01/23/2022   Lower GI bleeding 11/18/2021   Hematochezia    Acute blood loss anemia (ABLA)    BRBPR (bright red blood per rectum) 11/17/2021   CKD (chronic kidney disease) stage 4, GFR 15-29 ml/min (HCC) 11/17/2021   Primary osteoarthritis of left hip 10/06/2021   Left hip pain 12/09/2020   Aortic atherosclerosis ([place]) 09/07/2020   Gait disorder 06/08/2020   Neoplasm of  uncertain behavior of skin 03/14/2020   Contusion of face 03/10/2020   Arthralgia 12/25/2019   Ankle swelling 08/08/2019   Hyperkalemia 05/15/2019   Osteoporosis 02/26/2018   Colon polyp 06/28/2016   COPD (chronic obstructive pulmonary disease) ([place]) 05/28/2016   Upper airway cough syndrome 05/22/2016   Wrist pain, right 12/14/2015   Lung mass 03/02/2015   Pre-operative cardiovascular examination 02/21/2015   DOE (dyspnea on exertion) 02/16/2015   TIA (transient ischemic attack) 12/31/2014    Abnormal mental state 12/30/2014   Diarrhea 11/26/2014   Well adult exam 11/26/2014   Seasonal allergies 12/31/2013   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   Cerumen impaction 03/20/2013   Hyperlipidemia    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   Long term (current) use of anticoagulants 11/08/2010   Rheumatoid arthritis ([place]) 06/23/2010   Pain in joint 11/26/2009   Acquired hypothyroidism 12/08/2006   Macular degeneration (senile) of retina 12/08/2006   Essential hypertension 12/08/2006   Diaphragmatic hernia 12/08/2006   ASPLENIA 12/08/2006   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Phone: [phone]: [phone]    Social Determinants of Health (SDOH) Interventions    Readmission Risk Interventions     No data to display

## 2022-03-09 DIAGNOSIS — N281 Cyst of kidney, acquired: Secondary | ICD-10-CM | POA: Diagnosis not present

## 2022-03-09 DIAGNOSIS — I517 Cardiomegaly: Secondary | ICD-10-CM | POA: Diagnosis not present

## 2022-03-09 DIAGNOSIS — J432 Centrilobular emphysema: Secondary | ICD-10-CM | POA: Diagnosis not present

## 2022-03-09 DIAGNOSIS — S73005A Unspecified dislocation of left hip, initial encounter: Secondary | ICD-10-CM | POA: Diagnosis not present

## 2022-03-09 DIAGNOSIS — D649 Anemia, unspecified: Secondary | ICD-10-CM | POA: Diagnosis not present

## 2022-03-09 DIAGNOSIS — K5901 Slow transit constipation: Secondary | ICD-10-CM | POA: Diagnosis not present

## 2022-03-09 DIAGNOSIS — M2550 Pain in unspecified joint: Secondary | ICD-10-CM | POA: Diagnosis not present

## 2022-03-09 DIAGNOSIS — I129 Hypertensive chronic kidney disease with stage 1 through stage 4 chronic kidney disease, or unspecified chronic kidney disease: Secondary | ICD-10-CM | POA: Diagnosis not present

## 2022-03-09 DIAGNOSIS — E782 Mixed hyperlipidemia: Secondary | ICD-10-CM | POA: Diagnosis not present

## 2022-03-09 DIAGNOSIS — N184 Chronic kidney disease, stage 4 (severe): Secondary | ICD-10-CM | POA: Diagnosis not present

## 2022-03-09 DIAGNOSIS — K8020 Calculus of gallbladder without cholecystitis without obstruction: Secondary | ICD-10-CM | POA: Diagnosis not present

## 2022-03-09 DIAGNOSIS — E559 Vitamin D deficiency, unspecified: Secondary | ICD-10-CM | POA: Diagnosis not present

## 2022-03-09 DIAGNOSIS — E875 Hyperkalemia: Secondary | ICD-10-CM | POA: Diagnosis not present

## 2022-03-09 DIAGNOSIS — R2689 Other abnormalities of gait and mobility: Secondary | ICD-10-CM | POA: Diagnosis not present

## 2022-03-09 DIAGNOSIS — R269 Unspecified abnormalities of gait and mobility: Secondary | ICD-10-CM | POA: Diagnosis not present

## 2022-03-09 DIAGNOSIS — N179 Acute kidney failure, unspecified: Secondary | ICD-10-CM | POA: Diagnosis not present

## 2022-03-09 DIAGNOSIS — I10 Essential (primary) hypertension: Secondary | ICD-10-CM | POA: Diagnosis not present

## 2022-03-09 DIAGNOSIS — G459 Transient cerebral ischemic attack, unspecified: Secondary | ICD-10-CM | POA: Diagnosis not present

## 2022-03-09 DIAGNOSIS — D62 Acute posthemorrhagic anemia: Secondary | ICD-10-CM | POA: Diagnosis not present

## 2022-03-09 DIAGNOSIS — I824Z1 Acute embolism and thrombosis of unspecified deep veins of right distal lower extremity: Secondary | ICD-10-CM | POA: Diagnosis not present

## 2022-03-09 DIAGNOSIS — I82421 Acute embolism and thrombosis of right iliac vein: Secondary | ICD-10-CM | POA: Diagnosis not present

## 2022-03-09 DIAGNOSIS — M069 Rheumatoid arthritis, unspecified: Secondary | ICD-10-CM | POA: Diagnosis not present

## 2022-03-09 DIAGNOSIS — M25473 Effusion, unspecified ankle: Secondary | ICD-10-CM | POA: Diagnosis not present

## 2022-03-09 DIAGNOSIS — I2699 Other pulmonary embolism without acute cor pulmonale: Secondary | ICD-10-CM | POA: Diagnosis not present

## 2022-03-09 DIAGNOSIS — I714 Abdominal aortic aneurysm, without rupture, unspecified: Secondary | ICD-10-CM | POA: Diagnosis not present

## 2022-03-09 DIAGNOSIS — S73005D Unspecified dislocation of left hip, subsequent encounter: Secondary | ICD-10-CM | POA: Diagnosis not present

## 2022-03-09 DIAGNOSIS — I700 Atherosclerosis of aorta: Secondary | ICD-10-CM | POA: Diagnosis not present

## 2022-03-09 DIAGNOSIS — K922 Gastrointestinal hemorrhage, unspecified: Secondary | ICD-10-CM | POA: Diagnosis not present

## 2022-03-09 DIAGNOSIS — Z9889 Other specified postprocedural states: Secondary | ICD-10-CM | POA: Diagnosis not present

## 2022-03-09 DIAGNOSIS — E039 Hypothyroidism, unspecified: Secondary | ICD-10-CM | POA: Diagnosis not present

## 2022-03-09 DIAGNOSIS — I2510 Atherosclerotic heart disease of native coronary artery without angina pectoris: Secondary | ICD-10-CM | POA: Diagnosis not present

## 2022-03-09 DIAGNOSIS — R112 Nausea with vomiting, unspecified: Secondary | ICD-10-CM | POA: Diagnosis not present

## 2022-03-09 DIAGNOSIS — K219 Gastro-esophageal reflux disease without esophagitis: Secondary | ICD-10-CM | POA: Diagnosis not present

## 2022-03-09 DIAGNOSIS — D631 Anemia in chronic kidney disease: Secondary | ICD-10-CM | POA: Diagnosis not present

## 2022-03-09 LAB — BASIC METABOLIC PANEL
Anion gap: 11 (ref 5–15)
BUN: 44 mg/dL — ABNORMAL HIGH (ref 8–23)
CO2: 27 mmol/L (ref 22–32)
Calcium: 9.5 mg/dL (ref 8.9–10.3)
Chloride: 100 mmol/L (ref 98–111)
Creatinine, Ser: 2.55 mg/dL — ABNORMAL HIGH (ref 0.44–1.00)
GFR, Estimated: 18 mL/min — ABNORMAL LOW (ref >60)
Glucose, Bld: 109 mg/dL — ABNORMAL HIGH (ref 70–99)
Potassium: 3.4 mmol/L — ABNORMAL LOW (ref 3.5–5.1)
Sodium: 138 mmol/L (ref 135–145)

## 2022-03-09 LAB — CBC WITH DIFFERENTIAL/PLATELET
Abs Immature Granulocytes: 0.04 10*3/uL (ref 0.00–0.07)
Basophils Absolute: 0.2 10*3/uL — ABNORMAL HIGH (ref 0.0–0.1)
Basophils Relative: 2 %
Eosinophils Absolute: 0.8 10*3/uL — ABNORMAL HIGH (ref 0.0–0.5)
Eosinophils Relative: 9 %
HCT: 28.5 % — ABNORMAL LOW (ref 36.0–46.0)
Hemoglobin: 9.6 g/dL — ABNORMAL LOW (ref 12.0–15.0)
Immature Granulocytes: 0 %
Lymphocytes Relative: 19 %
Lymphs Abs: 1.9 10*3/uL (ref 0.7–4.0)
MCH: 30.4 pg (ref 26.0–34.0)
MCHC: 33.7 g/dL (ref 30.0–36.0)
MCV: 90.2 fL (ref 80.0–100.0)
Monocytes Absolute: 1.5 10*3/uL — ABNORMAL HIGH (ref 0.1–1.0)
Monocytes Relative: 15 %
Neutro Abs: 5.5 10*3/uL (ref 1.7–7.7)
Neutrophils Relative %: 55 %
Platelets: 421 10*3/uL — ABNORMAL HIGH (ref 150–400)
RBC: 3.16 MIL/uL — ABNORMAL LOW (ref 3.87–5.11)
RDW: 18 % — ABNORMAL HIGH (ref 11.5–15.5)
WBC: 9.9 10*3/uL (ref 4.0–10.5)
nRBC: 0.7 % — ABNORMAL HIGH (ref 0.0–0.2)

## 2022-03-09 LAB — BRAIN NATRIURETIC PEPTIDE: B Natriuretic Peptide: 265.5 pg/mL — ABNORMAL HIGH (ref 0.0–100.0)

## 2022-03-09 LAB — MAGNESIUM: Magnesium: 1.8 mg/dL (ref 1.7–2.4)

## 2022-03-09 MED ORDER — POTASSIUM CHLORIDE CRYS ER 10 MEQ PO TBCR: 10.0000 meq | EXTENDED_RELEASE_TABLET | Freq: Every day | ORAL | 0 refills | Status: DC

## 2022-03-09 MED ORDER — AMLODIPINE BESYLATE 5 MG PO TABS: 5.0000 mg | ORAL_TABLET | Freq: Every day | ORAL | Status: DC

## 2022-03-09 MED ORDER — PANTOPRAZOLE SODIUM 40 MG PO TBEC: 40.0000 mg | DELAYED_RELEASE_TABLET | Freq: Two times a day (BID) | ORAL | Status: DC

## 2022-03-09 MED ORDER — AMLODIPINE BESYLATE 10 MG PO TABS: 10.0000 mg | ORAL_TABLET | Freq: Every day | ORAL | Status: DC

## 2022-03-09 MED ORDER — AMLODIPINE BESYLATE 5 MG PO TABS
5.0000 mg | ORAL_TABLET | Freq: Every day | ORAL | Status: DC
  Administered 2022-03-09: 5 mg via ORAL
  Filled 2022-03-09: qty 1

## 2022-03-09 MED ORDER — POTASSIUM CHLORIDE CRYS ER 20 MEQ PO TBCR
40.0000 meq | EXTENDED_RELEASE_TABLET | Freq: Once | ORAL | Status: AC
  Administered 2022-03-09: 40 meq via ORAL
  Filled 2022-03-09: qty 2

## 2022-03-09 MED ORDER — POLYETHYLENE GLYCOL 3350 17 G PO PACK: 17.0000 g | PACK | Freq: Every day | ORAL | 0 refills | Status: DC | PRN

## 2022-03-09 MED ORDER — CARVEDILOL 6.25 MG PO TABS
6.2500 mg | ORAL_TABLET | Freq: Two times a day (BID) | ORAL | Status: DC
  Administered 2022-03-09: 6.25 mg via ORAL
  Filled 2022-03-09: qty 1

## 2022-03-09 MED ORDER — CARVEDILOL 6.25 MG PO TABS: 6.2500 mg | ORAL_TABLET | Freq: Two times a day (BID) | ORAL | Status: DC

## 2022-03-09 MED ORDER — FUROSEMIDE 40 MG PO TABS
40.0000 mg | ORAL_TABLET | Freq: Every day | ORAL | Status: DC
  Administered 2022-03-09: 40 mg via ORAL
  Filled 2022-03-09: qty 1

## 2022-03-09 MED ORDER — FUROSEMIDE 40 MG PO TABS: 40.0000 mg | ORAL_TABLET | Freq: Every day | ORAL | Status: DC

## 2022-03-09 NOTE — Discharge Summary (Signed)
SHAJUAN MUSSO OAC:[id number] DOB: Jan 09, 1941 DOA: 03/06/2022  PCP: Cassandria Anger, MD  Admit date: 03/06/2022  Discharge date: 03/09/2022  Admitted From: Home   Disposition:  SNF   Recommendations for Outpatient Follow-up:   Follow up with PCP in 1-2 weeks  PCP Please obtain BMP/CBC, 2 view CXR in 1week,  (see Discharge instructions)   PCP Please follow up on the following pending results:    Home Health: None   Equipment/Devices: None  Consultations: Ortho Discharge Condition: Stable    CODE STATUS: Full    Diet Recommendation: Heart Healthy   Diet Order             Diet regular Room service appropriate? Yes; Fluid consistency: Thin  Diet effective now                    Chief Complaint  Patient presents with   Hip Pain     Brief history of present illness from the day of admission and additional interim summary     81 y.o. 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  Hospitalist team was requested to admit the patient.                                                                  Hospital Course    Left hip  posterior dislocation.  Recent history of left hip arthroplasty, left hip was reduced by the ER physician in the ER on 03/06/2022, and by orthopedics, continue supportive care, continue left knee immobilizer and posterior hip precautions at SNF, PT OT, will require SNF.  Medically stable for discharge.   AKI on CKD 4 with mild metabolic acidosis.  Massive fluid overload baseline creatinine of around 2.2, massive fluid overload, renal failure stabilized and improving with diuresis continue gentle Lasix and potassium supplementation upon discharge, check BMP in 5 to 7 days at [facility] post discharge.      Hypertension.  In poor control.  Placed on combination of Coreg and hydralazine monitor and adjust.  Discontinue Norvasc due to massive lower extremity edema.   Anemia of chronic disease.  No acute issues.   Recent history of LGI diverticular bleed.  On  PPI.  Avoid constipation.   Recent history of DVT.  Resumed home dose Eliquis.   COPD.  At baseline no wheezing or shortness of breath, supportive care.   Dyslipidemia.  On home dose statin.   Hypothyroidism.  Home dose Synthroid.   Hypomagnesemia and hypokalemia.  Replaced.      Discharge diagnosis     Principal Problem:   Acute renal failure superimposed on stage 4 chronic kidney disease (HCC) Active Problems:   Essential hypertension   Rheumatoid arthritis (HCC)   Acquired hypothyroidism   Hyperlipidemia   COPD (chronic obstructive pulmonary disease) (HCC)   High anion gap metabolic acidosis   Hip dislocation, left (HCC)   History of anemia due to chronic kidney disease   History of DVT (deep vein thrombosis)   Allergic rhinitis   AKI (acute kidney injury) [facility])    Discharge instructions    Discharge Instructions     Discharge instructions   Complete by: As directed    Knee immobilizer to left leg at all times.  Posterior hip precautions.  F/u with Dr. Erlinda Hong in two weeks.  Follow with Primary MD Plotnikov, [name], MD in 7  days   Get CBC, CMP, 2 view Chest X ray -  checked next visit with your primary MD or SNF MD    Activity: As tolerated with Full fall precautions use walker/cane & assistance as needed  Disposition Home    Diet: Heart Healthy    Special Instructions: If you have smoked or chewed Tobacco  in the last 2 yrs please stop smoking, stop any regular Alcohol  and or any Recreational drug use.  On your next visit with your primary care physician please Get Medicines reviewed and adjusted.  Please request your Prim.MD to go over all Hospital Tests and Procedure/Radiological results at the follow up, please get all Hospital records sent to your Prim MD by signing hospital release before you go home.  If you experience worsening of your admission symptoms, develop shortness of breath, life threatening emergency, suicidal or homicidal thoughts you must seek medical attention immediately by calling 911 or calling your MD immediately  if symptoms less severe.  You Must read complete instructions/literature along with all the possible adverse reactions/side effects for all the Medicines you take and that have been prescribed to you. Take any new Medicines after you have completely understood and accpet all the possible adverse reactions/side effects.   No wound care   Complete by: As directed        Discharge Medications   Allergies as of 03/09/2022       Reactions   Nsaids Other (See Comments)   Stomach upset Told to avoid due to Eliquis   Aspirin Other (See Comments)   Stomach upset Told to avoid due to Eliquis   Codeine Nausea And Vomiting   Nitrostat [nitroglycerin] Other (See Comments)   Bradycardia. Drop in heart rate         Medication List     STOP taking these medications    famotidine 20 MG tablet Commonly known as: PEPCID   hydrocortisone 2.5 % rectal cream Commonly known as: ANUSOL-HC   metoprolol tartrate 50 MG tablet Commonly known as: LOPRESSOR   torsemide 20 MG  tablet Commonly known as: DEMADEX       TAKE these medications    acetaminophen 325 MG tablet Commonly known as: TYLENOL Take 650 mg by mouth 2 (two) times daily as needed (generalized pain).   albuterol  108 (90 Base) MCG/ACT inhaler Commonly known as: VENTOLIN HFA Inhale 1-2 puffs into the lungs every 4 (four) hours as needed for wheezing or shortness of breath.   amLODipine 5 MG tablet Commonly known as: NORVASC Take 1 tablet (5 mg total) by mouth daily. Start taking on: March 10, 2022 What changed:  medication strength how much to take   apixaban 2.5 MG Tabs tablet Commonly known as: ELIQUIS Take 1 tablet (2.5 mg total) by mouth 2 (two) times daily.   ascorbic acid 500 MG tablet Commonly known as: VITAMIN C Take 500 mg by mouth daily.   atorvastatin 80 MG tablet Commonly known as: LIPITOR Take 80 mg by mouth daily.   carvedilol 6.25 MG tablet Commonly known as: COREG Take 1 tablet (6.25 mg total) by mouth 2 (two) times daily with a meal.   CENTRUM ADULT PO Take 1 tablet by mouth daily. 1 Tablet Daily.   Cyanocobalamin 2500 MCG Tabs Take 2,500 mcg by mouth daily.   docusate sodium 100 MG capsule Commonly known as: Colace Take 1-2 capsules (100-200 mg total) by mouth daily as needed for mild constipation.   furosemide 40 MG tablet Commonly known as: LASIX Take 1 tablet (40 mg total) by mouth daily.   gabapentin 300 MG capsule Commonly known as: NEURONTIN Take 1 capsule (300 mg total) by mouth at bedtime.   Golimumab 50 MG/0.5ML Soaj Take 50 mg by mouth every 2 (two) months. Hold for the next 2 weeks per Orthopedic surgery recommendations.   leflunomide 20 MG tablet Commonly known as: ARAVA Take 20 mg by mouth daily.   levothyroxine 88 MCG tablet Commonly known as: SYNTHROID TAKE 1 TABLET (88 MCG TOTAL) BY MOUTH DAILY BEFORE BREAKFAST.   pantoprazole 40 MG tablet Commonly known as: PROTONIX Take 1 tablet (40 mg total) by mouth 2 (two) times  daily. What changed: when to take this   polyethylene glycol 17 g packet Commonly known as: MIRALAX / GLYCOLAX Take 17 g by mouth daily as needed.   potassium chloride 10 MEQ tablet Commonly known as: KLOR-CON M Take 1 tablet (10 mEq total) by mouth daily.   Stiolto Respimat 2.5-2.5 MCG/ACT Aers Generic drug: Tiotropium Bromide-Olodaterol Inhale 2 each into the lungs at bedtime.   triamcinolone 55 MCG/ACT Aero nasal inhaler Commonly known as: NASACORT Place 1 spray into the nose daily.   Vitamin D3 50 MCG (2000 UT) capsule Take 1 capsule (2,000 Units total) by mouth daily.         Follow-up Information     Leandrew Koyanagi, MD. Schedule an appointment as soon as possible for a visit in 2 week(s).   Specialty: Orthopedic Surgery Contact information: [address] [place] [address] [phone]         Plotnikov, [name], MD. Schedule an appointment as soon as possible for a visit in 1 week(s).   Specialty: Internal Medicine Contact information: [place] [address] [phone]                 Major procedures and Radiology Reports - PLEASE review detailed and final reports thoroughly  -      DG Ankle Complete Left  Result Date: 03/07/2022 CLINICAL DATA:  Left ankle pain.  Hip dislocation. EXAM: LEFT ANKLE COMPLETE - 3+ VIEW COMPARISON:  None Available. FINDINGS: There is no evidence of fracture, dislocation, or joint effusion. There is no evidence of arthropathy or other focal bone abnormality. Soft tissues are unremarkable. Partially imaged  splint and bandage over the distal leg/ankle. IMPRESSION: No acute fracture or dislocation. Electronically Signed   By: Keane Police D.O.   On: 03/07/2022 10:37   DG Pelvis Portable  Result Date: 03/06/2022 CLINICAL DATA:  Reduction of dislocation EXAM: PORTABLE PELVIS 1-2 VIEWS COMPARISON:  01/23/2022 FINDINGS: No recent fracture or dislocation is seen. There is previous left hip  arthroplasty. There is interval clearing of pockets of air from the soft tissues since 01/23/2022. SI joints are symmetrical. Degenerative changes are noted in visualized lower lumbar spine. IMPRESSION: Previous left hip arthroplasty. No recent fracture or dislocation is seen. Lumbar spondylosis. Electronically Signed   By: Elmer Picker M.D.   On: 03/06/2022 14:57   DG Chest Port 1 View  Result Date: 03/06/2022 CLINICAL DATA:  Recent hip fracture complicated by DVT and PE, history of COPD. EXAM: PORTABLE CHEST 1 VIEW COMPARISON:  Chest radiograph 02/16/2022 FINDINGS: Median sternotomy wires and mediastinal surgical clips are again seen. The heart is mildly enlarged, unchanged. The upper mediastinal contours are stable. There are small bilateral pleural effusions with adjacent airspace opacities, left worse than right and worsened since 02/16/2022. The upper lobes are well aerated. There is no pneumothorax There is no acute osseous abnormality. IMPRESSION: Small left larger than right pleural effusions with adjacent bibasilar airspace opacities, worsened since 02/16/2022. Electronically Signed   By: Valetta Mole M.D.   On: 03/06/2022 11:24   DG HIP UNILAT WITH PELVIS 1V LEFT  Result Date: 03/06/2022 CLINICAL DATA:  Postreduction left hip.  170017. EXAM: DG HIP (WITH OR WITHOUT PELVIS) 1V*L* COMPARISON:  Earlier study today at 1:51 a.m. FINDINGS: Single AP view reveals relocation of the left femoral head prosthesis into the acetabular cup prosthesis. Evidence of fractures is not seen. IMPRESSION: Relocation of the left femoral head prosthesis into the acetabular cup prosthesis. Electronically Signed   By: Telford Nab M.D.   On: 03/06/2022 04:33   DG Hip Unilat With Pelvis 2-3 Views Left  Result Date: 03/06/2022 CLINICAL DATA:  Left hip pain. EXAM: DG HIP (WITH OR WITHOUT PELVIS) 2-3V LEFT COMPARISON:  None Available. FINDINGS: A total left hip replacement is seen with dorsal dislocation of the  left femoral prosthesis. There is no evidence of an acute fracture. Soft tissue structures are unremarkable. IMPRESSION: Total left hip replacement with dorsal dislocation of the left femoral prosthesis. Electronically Signed   By: Virgina Norfolk M.D.   On: 03/06/2022 02:11      Today   Subjective    Allisson Schindel today has no headache,no chest abdominal pain,no new weakness tingling or numbness, feels much better wants to go home today.    Objective   Blood pressure (!) 149/60, pulse 78, temperature 97.8 F (36.6 C), temperature source Oral, resp. rate 18, height '5\' 3"'$  (1.6 m), weight 66.3 kg, SpO2 93 %.   Intake/Output Summary (Last 24 hours) at 03/09/2022 0924 Last data filed at 03/09/2022 0600 Gross per 24 hour  Intake 700 ml  Output 600 ml  Net 100 ml    Exam  Awake Alert, No new F.N deficits,    [place].AT,PERRAL Supple Neck,   Symmetrical Chest wall movement, Good air movement bilaterally, CTAB RRR,No Gallops,   +ve B.Sounds, Abd Soft, Non tender,  No Cyanosis, Clubbing or edema    Data Review   Recent Labs  Lab 03/06/22 0222 03/07/22 0310 03/08/22 0650 03/09/22 0540  WBC 8.7 7.9 9.8 9.9  HGB 9.7* 10.0* 9.6* 9.6*  HCT 29.2* 30.7* 28.4* 28.5*  PLT 378 391 417* 421*  MCV 91.3 92.2 88.2 90.2  MCH 30.3 30.0 29.8 30.4  MCHC 33.2 32.6 33.8 33.7  RDW 17.5* 17.8* 17.9* 18.0*  LYMPHSABS 1.8 1.5 2.2 1.9  MONOABS 2.0* 1.3* 1.6* 1.5*  EOSABS 0.4 0.7* 1.0* 0.8*  BASOSABS 0.2* 0.1 0.2* 0.2*    Recent Labs  Lab 03/06/22 0222 03/07/22 0310 03/08/22 0650 03/09/22 0540  NA 137 141 140 138  K 3.5 3.8 3.6 3.4*  CL 100 101 100 100  CO2 20* '26 28 27  '$ GLUCOSE 116* 116* 105* 109*  BUN 43* 37* 40* 44*  CREATININE 3.02* 2.23* 2.44* 2.55*  CALCIUM 9.0 9.0 9.4 9.5  MG 1.1* 2.3 1.9 1.8  BNP  --  467.8* 371.8* 265.5*    Total Time in preparing paper work, data evaluation and todays exam - 44 minutes  Lala Lund M.D on 03/09/2022 at 9:24 AM  [facility]

## 2022-03-09 NOTE — TOC Transition Note (Signed)
Transition of Care [facility]) - CM/SW Discharge Note   Patient Details  Name: [name] MRN: [id number] Date of Birth: Aug 27, 1940  [facility] CM/SW Contact:  Milinda Antis, [place] Phone Number: 03/09/2022, 10:55 AM   Clinical Narrative:    Patient will DC to: [facility] Anticipated DC date:  03/09/2022 Family notified:  Yes (son and husband notified at bedside) Transport by: Family   Per MD patient ready for DC to SNF. RN to call report prior to discharge [phone] room 42A). RN, patient, patient's family, and facility notified of DC. Discharge Summary sent to facility.  Family will transport patient to facility.   CSW will sign off for now as social work intervention is no longer needed. Please consult [place] again if new needs arise.    Final next level of care: Skilled Nursing Facility Barriers to Discharge: Barriers Resolved   Patient Goals and CMS Choice   CMS Medicare.gov Compare Post Acute Care list provided to:: Patient Choice offered to / list presented to : Patient  Discharge Placement              Patient chooses bed at:  ([facility]) Patient to be transferred to facility by: husband/ son Name of family member notified: husband and son in room Patient and family notified of of transfer: 03/09/22  Discharge Plan and Services In-house Referral: Clinical Social Work                                   Social Determinants of Health ([place]) Interventions     Readmission Risk Interventions     No data to display

## 2022-03-09 NOTE — Progress Notes (Signed)
DISCHARGE NOTE SNF [name] to be discharged Skilled nursing facility per MD order. Patient verbalized understanding. Report called to [place] . Diagnosis, treatment, medications, and follow up appointments discussed.   Skin clean, dry and intact without evidence of skin break down, bilat Unna boots and knee brace intact, no evidence of skin tears noted. IV catheter discontinued intact. Site without signs and symptoms of complications. Dressing and pressure applied. Pt denies pain at the site currently. No complaints noted.    Discharge packet assembled. An After Visit Summary (AVS) was printed and given to the EMS personnel. Patient escorted via stretcher and discharged to [facility] via ambulance. Report called to accepting facility; all questions and concerns addressed.   Virgina Jock, RN

## 2022-03-09 NOTE — Progress Notes (Signed)
Physical Therapy Treatment Patient Details Name: [name] MRN: [id number] DOB: [birth year] Today's Date: 03/09/2022   History of Present Illness Pt is 81 yo female admitted 03/06/22 with posterior L hip dislocation and AKI on CKD.  L hip was reduced on 03/06/22 in ED with light sedation. Ortho recommended KI at all time, WBAT, and posterior hip precautions per orders.  Pt with hx including L anterior THA 01/18/22.  Other hx includes CKD, COPD, HLD, hypothyroidism, LE DVT, RA, GI Bleed 10/23.    PT Comments    Patient seen prior to discharging to SNF. Able to come to EOB with min guard and stand with minA. Ambulated to bathroom with RW and min guard. Increased ambulation distance in hallway with RW and min guard but continues to require cues for upright posture and posterior hip precautions. Able to recall 2/3 posterior hip precautions at beginning of session. Continue to recommend SNF for ongoing Physical Therapy.       Recommendations for follow up therapy are one component of a multi-disciplinary discharge planning process, led by the attending physician.  Recommendations may be updated based on patient status, additional functional criteria and insurance authorization.  Follow Up Recommendations  Skilled nursing-short term rehab (<3 hours/day) Can patient physically be transported by private vehicle: Yes   Assistance Recommended at Discharge Intermittent Supervision/Assistance  Patient can return home with the following A little help with walking and/or transfers;A little help with bathing/dressing/bathroom;Assistance with cooking/housework;Assist for transportation;Help with stairs or ramp for entrance   Equipment Recommendations  None recommended by PT    Recommendations for Other Services       Precautions / Restrictions Precautions Precautions: Fall;Posterior Hip Precaution Booklet Issued: Yes (comment) Precaution Comments: posterior hip precaution handout Required Braces or  Orthoses: Knee Immobilizer - Left Knee Immobilizer - Left: On except when in CPM (or with therapy) Restrictions Weight Bearing Restrictions: Yes LLE Weight Bearing: Weight bearing as tolerated     Mobility  Bed Mobility Overal bed mobility: Needs Assistance Bed Mobility: Supine to Sit     Supine to sit: Min guard     General bed mobility comments: min guard for safety but no physical assist required    Transfers Overall transfer level: Needs assistance Equipment used: Rolling Latorsha Curling (2 wheels) Transfers: Sit to/from Stand Sit to Stand: Min assist           General transfer comment: assist to boost up into standing from EOB but min guard from 3 in 1 over commode    Ambulation/Gait Ambulation/Gait assistance: Min guard Gait Distance (Feet): 110 Feet (+10' to bathroom) Assistive device: Rolling Chance Munter (2 wheels) Gait Pattern/deviations: Step-through pattern, Decreased stride length, Decreased weight shift to right Gait velocity: Decreased     General Gait Details: cues for RW proximity, upright posture, and posterior hip precautions   Stairs             Wheelchair Mobility    Modified Rankin (Stroke Patients Only)       Balance Overall balance assessment: Needs assistance Sitting-balance support: Feet supported Sitting balance-Leahy Scale: Good     Standing balance support: Reliant on assistive device for balance, Bilateral upper extremity supported Standing balance-Leahy Scale: Poor                              Cognition Arousal/Alertness: Awake/alert Behavior During Therapy: WFL for tasks assessed/performed Overall Cognitive Status: Impaired/Different from baseline Area of Impairment: Memory  Memory: Decreased recall of precautions         General Comments: recalled 2/3 hip precautions (unable to recall 90 degrees)        Exercises      General Comments        Pertinent Vitals/Pain Pain  Assessment Pain Assessment: Faces Faces Pain Scale: Hurts a little bit Pain Location: L LLE, ankle/foot Pain Descriptors / Indicators: Discomfort Pain Intervention(s): Monitored during session    Home Living                          Prior Function            PT Goals (current goals can now be found in the care plan section) Acute Rehab PT Goals PT Goal Formulation: With patient/family Time For Goal Achievement: 03/20/22 Potential to Achieve Goals: Good Progress towards PT goals: Progressing toward goals    Frequency    7X/week      PT Plan Current plan remains appropriate    Co-evaluation              AM-PAC PT "6 Clicks" Mobility   Outcome Measure  Help needed turning from your back to your side while in a flat bed without using bedrails?: A Little Help needed moving from lying on your back to sitting on the side of a flat bed without using bedrails?: A Little Help needed moving to and from a bed to a chair (including a wheelchair)?: A Little Help needed standing up from a chair using your arms (e.g., wheelchair or bedside chair)?: A Little Help needed to walk in hospital room?: A Little Help needed climbing 3-5 steps with a railing? : Total 6 Click Score: 16    End of Session Equipment Utilized During Treatment: Gait belt;Left knee immobilizer Activity Tolerance: Patient tolerated treatment well Patient left: in chair;with call bell/phone within reach;with family/visitor present Nurse Communication: Mobility status PT Visit Diagnosis: Difficulty in walking, not elsewhere classified (R26.2);Other abnormalities of gait and mobility (R26.89)     Time: 8841-6606 PT Time Calculation (min) (ACUTE ONLY): 24 min  Charges:  $Gait Training: 8-22 mins $Therapeutic Activity: 8-22 mins                     Leighana Neyman A. Gilford Rile PT, DPT Acute Rehabilitation Services Office [phone]    Linna Hoff 03/09/2022, 11:54 AM

## 2022-03-09 NOTE — Progress Notes (Signed)
DISCHARGE NOTE SNF [name] to be discharged  [facility]   per MD order. Patient verbalized understanding.  Skin clean, dry and intact without evidence of skin break down, no evidence of skin tears noted. IV catheter discontinued intact. Site without signs and symptoms of complications. Dressing and pressure applied. Pt denies pain at the site currently. No complaints noted.  Patient free of lines, drains, and wounds.   Discharge packet assembled. An After Visit Summary (AVS) was printed and given to the family member who will be transporting patient to the facility. Report called to accepting facility; all questions and concerns addressed.   Arlyss Repress, RN

## 2022-03-10 ENCOUNTER — Non-Acute Institutional Stay (SKILLED_NURSING_FACILITY): Payer: Medicare PPO | Admitting: Nurse Practitioner

## 2022-03-10 ENCOUNTER — Encounter: Payer: Self-pay | Admitting: Nurse Practitioner

## 2022-03-10 ENCOUNTER — Telehealth: Payer: Self-pay | Admitting: *Deleted

## 2022-03-10 DIAGNOSIS — I824Z1 Acute embolism and thrombosis of unspecified deep veins of right distal lower extremity: Secondary | ICD-10-CM

## 2022-03-10 DIAGNOSIS — M069 Rheumatoid arthritis, unspecified: Secondary | ICD-10-CM | POA: Diagnosis not present

## 2022-03-10 DIAGNOSIS — I714 Abdominal aortic aneurysm, without rupture, unspecified: Secondary | ICD-10-CM | POA: Diagnosis not present

## 2022-03-10 DIAGNOSIS — E559 Vitamin D deficiency, unspecified: Secondary | ICD-10-CM

## 2022-03-10 DIAGNOSIS — I10 Essential (primary) hypertension: Secondary | ICD-10-CM | POA: Diagnosis not present

## 2022-03-10 DIAGNOSIS — E782 Mixed hyperlipidemia: Secondary | ICD-10-CM

## 2022-03-10 DIAGNOSIS — K922 Gastrointestinal hemorrhage, unspecified: Secondary | ICD-10-CM | POA: Diagnosis not present

## 2022-03-10 DIAGNOSIS — J432 Centrilobular emphysema: Secondary | ICD-10-CM

## 2022-03-10 DIAGNOSIS — K219 Gastro-esophageal reflux disease without esophagitis: Secondary | ICD-10-CM

## 2022-03-10 DIAGNOSIS — E039 Hypothyroidism, unspecified: Secondary | ICD-10-CM

## 2022-03-10 DIAGNOSIS — D62 Acute posthemorrhagic anemia: Secondary | ICD-10-CM | POA: Diagnosis not present

## 2022-03-10 DIAGNOSIS — K5901 Slow transit constipation: Secondary | ICD-10-CM

## 2022-03-10 DIAGNOSIS — K625 Hemorrhage of anus and rectum: Secondary | ICD-10-CM

## 2022-03-10 DIAGNOSIS — G459 Transient cerebral ischemic attack, unspecified: Secondary | ICD-10-CM

## 2022-03-10 DIAGNOSIS — I2699 Other pulmonary embolism without acute cor pulmonale: Secondary | ICD-10-CM

## 2022-03-10 DIAGNOSIS — N184 Chronic kidney disease, stage 4 (severe): Secondary | ICD-10-CM

## 2022-03-10 NOTE — Progress Notes (Signed)
  Care Coordination Note  03/10/2022 Name: AMBROSIA WISNEWSKI MRN: [id number] DOB: 1940-07-15  [name] is a 81 y.o. year old female who is a primary care patient of Plotnikov, [name], MD and is actively engaged with the care management team. I reached out to Barbaraann Boys by phone today to assist with re-scheduling an initial visit with the RN Case Manager  Follow up plan: Unsuccessful telephone outreach attempt made. A HIPAA compliant phone message was left for the patient providing contact information and requesting a return call.   Julian Hy, [place] Direct Dial: [phone]

## 2022-03-10 NOTE — Assessment & Plan Note (Signed)
Hospitalized 02/06/22-02/11/22 for acute PE R middle lobe,  on Eliquis now, treat with shortest course possible, dc Eliquis if bleeds again

## 2022-03-10 NOTE — Assessment & Plan Note (Signed)
01/28/22 [place] age indeterminate deep vein thrombosis  involving the right peroneal veins, on Eliquis

## 2022-03-10 NOTE — Assessment & Plan Note (Signed)
DOE/COPD, prn Albuterol ACT, Stiolto, Triamcinolone nasal spray, O2 2lpm

## 2022-03-10 NOTE — Assessment & Plan Note (Signed)
Hx of L GI diverticular bleed, on PPI

## 2022-03-10 NOTE — Assessment & Plan Note (Signed)
akes Atorvastatin, LDL 74 09/07/20

## 2022-03-10 NOTE — Assessment & Plan Note (Signed)
on Atorvastatin, Eliquis.

## 2022-03-10 NOTE — Assessment & Plan Note (Signed)
BRBPR hemorrhoids, Hydrocortisone cream, avoid constipation

## 2022-03-10 NOTE — Assessment & Plan Note (Signed)
deficiency, takes Vit D

## 2022-03-10 NOTE — Assessment & Plan Note (Signed)
takes Levothyroxine, TSH 2.35 06/21/21

## 2022-03-10 NOTE — Assessment & Plan Note (Signed)
Hx of  PMR takes Golimumab,  Leflunomide, fx of knees, hands, wrists, cervical disc disease/fracture, takes Gabapentin and Tylenol for pain.  Hospitalized 03/06/22-03/09/22, fell, left hip posterior dislocation, reduced in ER 03/06/22, followed by Ortho, left knee immobilizer use, [facility] for therapy

## 2022-03-10 NOTE — Assessment & Plan Note (Addendum)
Bun/creat 44/2.55 03/09/22,  followed by Nephrology, update CMP/eGFR

## 2022-03-10 NOTE — Assessment & Plan Note (Signed)
takes Pantoprazole, nauseated and vomited today, last BM today-loose stool, c/o abd pain earlier, no pain palpated. Will have prn Zofran '4mg'$  q6hr available, update lipase, Amylase, US liver, gallbladder, pancrease, Xray abd, CXR ap/lateral. ED if no better.  03/10/22 X-ray abd normal abd.

## 2022-03-10 NOTE — Assessment & Plan Note (Signed)
Hospitalized 02/16/22-02/22/22 rectal bleed, external hemorrhoids, gb 6.1, PRBC x2 units transfused, f/u GI. take Vit B12, Vit B12>2000, Iron 77 02/14/22, Hgb 9.6 03/09/22. Update CBC/diff

## 2022-03-10 NOTE — Assessment & Plan Note (Signed)
Blood pressure is controlled, takes Coreg, Hydralazine, Amlodipine

## 2022-03-10 NOTE — Assessment & Plan Note (Signed)
hold Colace, MiraLax for loose stools.

## 2022-03-10 NOTE — Assessment & Plan Note (Signed)
s/p CABG x1, LIMA LAD for ostial LAD, EF wnl. S/p aneurysm repair

## 2022-03-10 NOTE — Progress Notes (Signed)
Location:  [name] Room Number: NO/42/A Place of Service:  SNF (31) Provider:  Deloy Archey X, NP  Patient Care Team: Plotnikov, [name], MD as PCP - General (Internal Medicine) Leonie Marien Manship, MD as PCP - Cardiology (Cardiology) Leonie Dotti Busey, MD as Consulting Physician (Cardiology) Gavin Pound, MD as Consulting Physician (Rheumatology) Armbruster, Carlota Raspberry, MD as Consulting Physician (Gastroenterology) Tanda Rockers, MD as Consulting Physician (Pulmonary Disease) Grace Isaac, MD (Inactive) as Consulting Physician (Cardiothoracic Surgery) Aloha Gell, MD as Consulting Physician (Obstetrics and Gynecology) Plotnikov, [name], MD Luretha Rued, RN as [place] Management  Extended Emergency Contact Information Primary Emergency Contact: [name] Address: [address]           [place], [place] [address] [place] of [place] Phone: [phone] Mobile Phone: [phone] Relation: Spouse Secondary Emergency Contact: [name],[name] Address: [address], [place] [address] [place] Phone: [phone] Relation: Friend  Code Status:  FULL Goals of care: Advanced Directive information    03/10/2022   11:15 AM  Advanced Directives  Does Patient Have a Medical Advance Directive? No  Would patient like information on creating a medical advance directive? No - Patient declined     Chief Complaint  Patient presents with   Follow-up    Patient is here for follow up after hospital stay     HPI:  Pt is a 81 y.o. female seen today for an acute visit for medication review following hospital stay.     Hospitalized 03/06/22-03/09/22, fell, left hip posterior dislocation, reduced in ER 03/06/22, followed by Ortho, left knee immobilizer use, SN[facility] for therapy             Hospitalized 02/16/22-02/22/22 rectal bleed, external hemorrhoids, gb 6.1, PRBC x2  units transfused, f/u GI              Hospitalized 02/06/22-02/11/22 for acute PE R middle lobe,  on Eliquis now, treat with shortest course possible, dc Eliquis if bleeds again             01/23/22 s/p total replacement of L hip, f/u Dr Erlinda Hong.             DOE/COPD, prn Albuterol ACT, Stiolto, Triamcinolone nasal spray, O2 2lpm  BRBPR hemorrhoids, Hydrocortisone cream, avoid constipation             DVT 01/28/22 [place] age indeterminate deep vein thrombosis  involving the right peroneal veins, on Eliquis             HTN, takes Coreg, Hydralazine, Amlodipine             HLD takes Atorvastatin, LDL 74 09/07/20             Vit D deficiency, takes Vit D             Anemia, take Vit B12, Vit B12>2000, Iron 77 02/14/22, Hgb 9.6 03/09/22             Constipation, takes Colace, MiraLax             GERD, takes Pantoprazole  Hx of L GI diverticular bleed, on PPI             RA, takes Golimumab,  Leflunomide, fx of knees, hands, wrists, cervical disc disease/fracture, takes Gabapentin,              Hypothyroidism,  takes Levothyroxine, TSH 2.35 06/21/21             CAD s/p CABG x1, LIMA LAD for ostial LAD, EF wnl. S/p aneurysm repair             CKD, Bun/creat 44/2.55 03/09/22,  followed by Nephrology             TIA/Stroke, on Atorvastatin, Eliquis.              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 Most likely scar. Mild subendocardial ischemia. EF 71% LOW RISK.    Cervical disc disease    fracture   Chronic kidney disease    COPD (chronic obstructive pulmonary disease) (HCC)    Diverticulosis    DVT (deep venous thrombosis) ([place])    RLE; Sept '23   History of pulmonary embolus (PE)    Oct '23   Hyperlipidemia    Hypertension    Hypothyroidism (acquired)    hypo   Myocardial infarction ([place]) 11/2014   TIA   Polymyalgia rheumatica ([place])    2011 Dr. Charlestine Night   Rheumatoid arthritis [facility]) 2011   Dr.Truslow; fracture knees, hands and wrists -    S/P CABG x 1 1996   CABG--LIMA-LAD for ostial LAD (not felt to be PCI amenable). 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 Atretic LIMA. Minimal disease the RCA and Circumflex system.   CARPAL TUNNEL RELEASE Left    CATARACT EXTRACTION Bilateral    CERVICAL SPINE SURGERY     plate 2008 Dr. Saintclair Halsted   COLONOSCOPY     CORONARY ARTERY BYPASS GRAFT  1996   INCLUDED AN INTERNAL MAMMARY ARTERY TO THE LAD. EF WAS NORMAL   INGUINAL HERNIA REPAIR Right    LAPAROSCOPIC APPENDECTOMY N/A 06/28/2016   Procedure: APPENDECTOMY LAPAROSCOPIC;  Surgeon: Leighton Ruff, MD;  Location: [facility];  Service: General;  Laterality: N/A;   NM MYOVIEW LTD  06/2010   Fixed anteroseptal, apical and inferoapical defect with moderate size. Most likely scar. Mild subendocardial ischemia. EF 71% LOW RISK.    SPLENECTOMY     TOTAL HIP ARTHROPLASTY Left 01/23/2022   Procedure: LEFT TOTAL HIP  ARTHROPLASTY ANTERIOR APPROACH;  Surgeon: Erlinda Hong,  Marylynn Pearson, MD;  Location: [facility];  Service: Orthopedics;  Laterality: Left;  3-C   TRANSTHORACIC ECHOCARDIOGRAM  12/2014   [facility]: Normal LV size & function. EF 55-60%,    vagina polyp     VIDEO ASSISTED THORACOSCOPY (VATS)/WEDGE RESECTION Left 03/02/2015   Procedure: VIDEO ASSISTED THORACOSCOPY (VATS), MINI THORACOTOMY, LEFT UPPER LOBE WEDGE, TAKE DOWN OF INTERNAL MAMMARY LESIONS, PLACEMENT OF ON-Q PUMP;  Surgeon: Grace Isaac, MD;  Location: [place];  Service: Thoracic;  Laterality: Left;   VIDEO BRONCHOSCOPY N/A 03/02/2015   Procedure: BRONCHOSCOPY;  Surgeon: Grace Isaac, MD;  Location: [facility];  Service: Thoracic;  Laterality: N/A;   VIDEO BRONCHOSCOPY WITH ENDOBRONCHIAL NAVIGATION N/A 10/08/2017   Procedure: VIDEO BRONCHOSCOPY WITH ENDOBRONCHIAL NAVIGATION WITH BIOPSIES OF LEFT UPPER LOBE AND LEFT LOWER LOBE;  Surgeon: Grace Isaac, MD;  Location: [facility];  Service: Thoracic;  Laterality: N/A;    Allergies  Allergen Reactions   Nsaids Other (See Comments)    Stomach upset Told to avoid due to Eliquis   Aspirin Other (See Comments)    Stomach upset Told to avoid due to Eliquis   Codeine Nausea And Vomiting   Nitrostat [Nitroglycerin] Other (See Comments)    Bradycardia. Drop in heart rate     Outpatient Encounter Medications as of 03/10/2022  Medication Sig   acetaminophen (TYLENOL) 325 MG tablet Take 650 mg by mouth 2 (two) times daily as needed (generalized pain).   albuterol (VENTOLIN HFA) 108 (90 Base) MCG/ACT inhaler Inhale 1-2 puffs into the lungs every 4 (four) hours as needed for wheezing or shortness of breath.   amLODipine (NORVASC) 5 MG tablet Take 1 tablet (5 mg total) by mouth daily.   apixaban (ELIQUIS) 2.5 MG TABS tablet Take 1 tablet (2.5 mg total) by mouth 2 (two) times daily.   ascorbic acid (VITAMIN C) 500 MG tablet Take 500 mg by mouth daily.   atorvastatin (LIPITOR) 80 MG tablet Take 80 mg  by mouth daily.   carvedilol (COREG) 6.25 MG tablet Take 1 tablet (6.25 mg total) by mouth 2 (two) times daily with a meal.   Cholecalciferol (VITAMIN D3) 50 MCG (2000 UT) capsule Take 1 capsule (2,000 Units total) by mouth daily.   Cyanocobalamin 2500 MCG TABS Take 2,500 mcg by mouth daily.   docusate sodium (COLACE) 100 MG capsule Take 1-2 capsules (100-200 mg total) by mouth daily as needed for mild constipation.   furosemide (LASIX) 40 MG tablet Take 1 tablet (40 mg total) by mouth daily.   gabapentin (NEURONTIN) 300 MG capsule Take 1 capsule (300 mg total) by mouth at bedtime.   Golimumab 50 MG/0.5ML SOAJ Take 50 mg by mouth every 2 (two) months. Hold for the next 2 weeks per Orthopedic surgery recommendations.   leflunomide (ARAVA) 20 MG tablet Take 20 mg by mouth daily.    levothyroxine (SYNTHROID) 88 MCG tablet TAKE 1 TABLET (88 MCG TOTAL) BY MOUTH DAILY BEFORE BREAKFAST.   Multiple Vitamins-Minerals (CENTRUM ADULT PO) Take 1 tablet by mouth daily. 1 Tablet Daily.   pantoprazole (PROTONIX) 40 MG tablet Take 1 tablet (40 mg total) by mouth 2 (two) times daily.   polyethylene glycol (MIRALAX / GLYCOLAX) 17 g packet Take 17 g by mouth daily as needed.   potassium chloride SA (KLOR-CON M) 10 MEQ tablet Take 1 tablet (10 mEq total) by mouth daily.   Tiotropium Bromide-Olodaterol (STIOLTO RESPIMAT) 2.5-2.5 MCG/ACT AERS Inhale 2 each into the lungs at bedtime.  triamcinolone (NASACORT) 55 MCG/ACT AERO nasal inhaler Place 1 spray into the nose daily.   No facility-administered encounter medications on file as of 03/10/2022.    Review of Systems  Constitutional:  Positive for activity change, appetite change and fatigue. Negative for fever.  HENT:  Negative for congestion and trouble swallowing.   Eyes:  Negative for visual disturbance.  Respiratory:  Positive for cough and shortness of breath. Negative for wheezing.        DOE  Cardiovascular:  Positive for leg swelling.   Gastrointestinal:  Positive for abdominal pain, blood in stool, nausea and vomiting. Negative for constipation.  Genitourinary:  Negative for difficulty urinating, dysuria and urgency.  Musculoskeletal:  Positive for arthralgias, back pain, gait problem and joint swelling.  Skin:  Negative for color change.  Neurological:  Negative for tremors and headaches.  Psychiatric/Behavioral:  Negative for behavioral problems and sleep disturbance. The patient is not nervous/anxious.     Immunization History  Administered Date(s) Administered   Fluad Quad(high Dose 65+) 02/03/2021, 01/16/2022   Influenza Whole 05/02/2001, 01/27/2008, 01/12/2010, 12/31/2010, 01/25/2012   Influenza, High Dose Seasonal PF 01/14/2014, 02/13/2015, 12/21/2016, 01/09/2018, 12/29/2018   Influenza,inj,Quad PF,6+ Mos 12/23/2015   Influenza-Unspecified 02/03/2013, 01/13/2014, 02/13/2015, 01/20/2020   Meningococcal Polysaccharide 03/21/2012   Moderna SARS-COV2 Booster Vaccination 03/15/2020   Moderna Sars-Covid-2 Vaccination 06/02/2019, 06/30/2019   PPD Test 11/18/2013   Pfizer Covid-19 Vaccine Bivalent Booster 26yr & up 01/19/2021, 10/12/2021   Pneumococcal Conjugate-13 03/20/2013   Pneumococcal Polysaccharide-23 05/02/2000, 06/23/2010, 02/26/2018   Td 05/01/2006   Tdap 06/01/2016   Zoster Recombinat (Shingrix) 05/16/2017, 07/20/2017   Zoster, Live 01/17/2006   Pertinent  Health Maintenance Due  Topic Date Due   COLONOSCOPY (Pts 45-445yrInsurance coverage will need to be confirmed)  03/30/2019   INFLUENZA VACCINE  Completed   DEXA SCAN  Completed      03/07/2022    8:00 PM 03/08/2022    4:00 PM 03/09/2022    3:00 AM 03/09/2022   10:10 AM 03/10/2022   11:15 AM  Fall Risk  Falls in the past year?     1  Was there an injury with Fall?     0  Fall Risk Category Calculator     1  Fall Risk Category     Low  Patient Fall Risk Level _0   Patient  at Risk for Falls Due to     History of fall(s);Impaired balance/gait  Fall risk Follow up     Falls evaluation completed   Functional Status Survey:    Vitals:   03/10/22 1114  BP: (!) 148/68  Pulse: 93  Resp: 17  Temp: 97.6 F (36.4 C)  SpO2: 95%  Weight: 159 lb (72.1 kg)  Height: _1  (1.6 m)   Body mass index is 28.17 kg/m. Physical Exam Constitutional:      Comments: Exhausted easily.   HENT:     Head: Normocephalic and atraumatic.     Nose: Nose normal.     Mouth/Throat:     Mouth: Mucous membranes are moist.  Eyes:     Extraocular Movements: Extraocular movements intact.     Conjunctiva/sclera: Conjunctivae normal.     Pupils: Pupils are equal, round, and reactive to light.  Cardiovascular:     Rate and Rhythm: Normal rate and regular rhythm.     Heart sounds: No murmur heard. Pulmonary:     Effort: Pulmonary  effort is normal.     Breath sounds: No wheezing, rhonchi or rales.  Abdominal:     General: Bowel sounds are normal.     Palpations: Abdomen is soft.     Tenderness: There is no abdominal tenderness. There is no right CVA tenderness, left CVA tenderness, guarding or rebound.  Genitourinary:    Comments: External hemorrhoids, bright red blood seen in commode and upon my rectal examination.  Musculoskeletal:     Cervical back: Normal range of motion and neck supple.     Right lower leg: Edema present.     Left lower leg: Edema present.     Comments: Moderate edema BLE L>R  Skin:    General: Skin is warm and dry.     Comments: L hip surgical scar  Neurological:     General: No focal deficit present.     Mental Status: She is alert and oriented to person, place, and time. Mental status is at baseline.     Motor: No weakness.     Gait: Gait abnormal.  Psychiatric:        Mood and Affect: Mood normal.        Behavior: Behavior normal.        Thought Content: Thought content normal.     Labs reviewed: Recent Labs    03/07/22 0310 03/08/22 0650  03/09/22 0540  NA 141 140 138  K 3.8 3.6 3.4*  CL 101 100 100  CO2 _0 GLUCOSE 116* 105* 109*  BUN 37* 40* 44*  CREATININE 2.23* 2.44* 2.55*  CALCIUM 9.0 9.4 9.5  MG 2.3 1.9 1.8   Recent Labs    02/06/22 1529 02/07/22 0315 02/14/22 0000 02/16/22 1205  AST 63* 48* 28 25  ALT 36 _1 ALKPHOS 123 110 108 74  BILITOT 0.5 0.7  --  0.3  PROT 6.5 5.2*  --  4.8*  ALBUMIN 2.4* 2.1* 3.1* 2.3*   Recent Labs    03/07/22 0310 03/08/22 0650 03/09/22 0540  WBC 7.9 9.8 9.9  NEUTROABS 4.2 4.9 5.5  HGB 10.0* 9.6* 9.6*  HCT 30.7* 28.4* 28.5*  MCV 92.2 88.2 90.2  PLT 391 417* 421*   Lab Results  Component Value Date   TSH 0.985 02/16/2022   No results found for: "HGBA1C" Lab Results  Component Value Date   CHOL 157 09/07/2020   HDL 59.30 09/07/2020   LDLCALC 74 09/07/2020   LDLDIRECT 162.1 10/28/2009   TRIG 121.0 09/07/2020   CHOLHDL 3 09/07/2020    Significant Diagnostic Results in last 30 days:  DG Ankle Complete Left  Result Date: 03/07/2022 CLINICAL DATA:  Left ankle pain.  Hip dislocation. EXAM: LEFT ANKLE COMPLETE - 3+ VIEW COMPARISON:  None Available. FINDINGS: There is no evidence of fracture, dislocation, or joint effusion. There is no evidence of arthropathy or other focal bone abnormality. Soft tissues are unremarkable. Partially imaged splint and bandage over the distal leg/ankle. IMPRESSION: No acute fracture or dislocation. Electronically Signed   By: Keane Police D.O.   On: 03/07/2022 10:37   DG Pelvis Portable  Result Date: 03/06/2022 CLINICAL DATA:  Reduction of dislocation EXAM: PORTABLE PELVIS 1-2 VIEWS COMPARISON:  01/23/2022 FINDINGS: No recent fracture or dislocation is seen. There is previous left hip arthroplasty. There is interval clearing of pockets of air from the soft tissues since 01/23/2022. SI joints are symmetrical. Degenerative changes are noted in visualized lower lumbar spine. IMPRESSION: Previous left hip arthroplasty. No recent  fracture or dislocation is seen. Lumbar spondylosis. Electronically Signed   By: Elmer Picker M.D.   On: 03/06/2022 14:57   DG Chest Port 1 View  Result Date: 03/06/2022 CLINICAL DATA:  Recent hip fracture complicated by DVT and PE, history of COPD. EXAM: PORTABLE CHEST 1 VIEW COMPARISON:  Chest radiograph 02/16/2022 FINDINGS: Median sternotomy wires and mediastinal surgical clips are again seen. The heart is mildly enlarged, unchanged. The upper mediastinal contours are stable. There are small bilateral pleural effusions with adjacent airspace opacities, left worse than right and worsened since 02/16/2022. The upper lobes are well aerated. There is no pneumothorax There is no acute osseous abnormality. IMPRESSION: Small left larger than right pleural effusions with adjacent bibasilar airspace opacities, worsened since 02/16/2022. Electronically Signed   By: Valetta Mole M.D.   On: 03/06/2022 11:24   DG HIP UNILAT WITH PELVIS 1V LEFT  Result Date: 03/06/2022 CLINICAL DATA:  Postreduction left hip.  [id number]. EXAM: DG HIP (WITH OR WITHOUT PELVIS) 1V*L* COMPARISON:  Earlier study today at 1:51 a.m. FINDINGS: Single AP view reveals relocation of the left femoral head prosthesis into the acetabular cup prosthesis. Evidence of fractures is not seen. IMPRESSION: Relocation of the left femoral head prosthesis into the acetabular cup prosthesis. Electronically Signed   By: Telford Nab M.D.   On: 03/06/2022 04:33   DG Hip Unilat With Pelvis 2-3 Views Left  Result Date: 03/06/2022 CLINICAL DATA:  Left hip pain. EXAM: DG HIP (WITH OR WITHOUT PELVIS) 2-3V LEFT COMPARISON:  None Available. FINDINGS: A total left hip replacement is seen with dorsal dislocation of the left femoral prosthesis. There is no evidence of an acute fracture. Soft tissue structures are unremarkable. IMPRESSION: Total left hip replacement with dorsal dislocation of the left femoral prosthesis. Electronically Signed   By: Virgina Norfolk M.D.   On: 03/06/2022 02:11   DG Chest Portable 1 View  Result Date: 02/16/2022 CLINICAL DATA:  GI bleeding EXAM: PORTABLE CHEST 1 VIEW COMPARISON:  Previous studies including the examination of 02/06/2022 FINDINGS: Transverse diameter of heart is slightly increased. There is previous coronary bypass surgery. There are no signs of pulmonary edema. There is blunting of lateral CP angles. Small linear densities are seen in right lower lung field. There is no pneumothorax. There is previous surgical fusion in cervical spine. IMPRESSION: Minimal bilateral pleural effusions. Linear densities in right lower lung fields suggest subsegmental atelectasis. Electronically Signed   By: Elmer Picker M.D.   On: 02/16/2022 14:12    Assessment/Plan Essential hypertension Blood pressure is controlled, takes Coreg, Hydralazine, Amlodipine  CKD (chronic kidney disease) stage 4, GFR 15-29 ml/min (HCC) Bun/creat 44/2.55 03/09/22,  followed by Nephrology, update CMP/eGFR   TIA (transient ischemic attack) on Atorvastatin, Eliquis.   Rheumatoid arthritis (HCC) Hx of  PMR takes Golimumab,  Leflunomide, fx of knees, hands, wrists, cervical disc disease/fracture, takes Gabapentin and Tylenol for pain.  Hospitalized 03/06/22-03/09/22, fell, left hip posterior dislocation, reduced in ER 03/06/22, followed by Ortho, left knee immobilizer use, SNF [facility] for therapy  Acute blood loss anemia ([facility]) Hospitalized 02/16/22-02/22/22 rectal bleed, external hemorrhoids, gb 6.1, PRBC x2 units transfused, f/u GI. take Vit B12, Vit B12>2000, Iron 77 02/14/22, Hgb 9.6 03/09/22. Update CBC/diff  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 S/p aneurysm repair  Acquired hypothyroidism takes Levothyroxine, TSH 2.35 06/21/21  Lower GI bleed Hx of L GI diverticular bleed, on PPI  GERD (gastroesophageal reflux disease)  takes Pantoprazole, nauseated  and vomited today, last BM today-loose stool, c/o abd pain earlier, no pain palpated. Will have prn Zofran 66m q6hr available, update lipase, Amylase, UKorealiver, gallbladder, pancrease, Xray abd, CXR ap/lateral. ED if no better.  03/10/22 X-ray abd normal abd.   Slow transit constipation hold Colace, MiraLax for loose stools.   Vitamin D deficiency deficiency, takes Vit D  Hyperlipidemia akes Atorvastatin, LDL 74 09/07/20  [place] indeterm[place] deep vein thrombosis  involving the right peroneal veins, on Eliquis  BRBPR (bright red blood per rectum)  BRBPR hemorrhoids, Hydrocortisone cream, avoid constipation  COPD (chronic obstructive pulmonary disease) (HCC) DOE/COPD, prn Albuterol ACT, Stiolto, Triamcinolone nasal spray, O2 2lpm  Pulmonary emboli ([place] Hospitalized 02/06/22-02/11/22 for acute PE R middle lobe,  on Eliquis now, treat with shortest course possible, dc Eliquis if bleeds again     Family/ staff Communication: plan of care reviewed with the patient, the patient's husband, and charge nurse.   Labs/tests ordered:  CBC/diff, CMP/eGFR, Amylase, Lipase, X-ray chest ap/lateral, abd, UKorealiver, gallbladder, pancrease.   Time spend 35 minutes.

## 2022-03-14 ENCOUNTER — Non-Acute Institutional Stay (SKILLED_NURSING_FACILITY): Payer: Medicare PPO | Admitting: Family Medicine

## 2022-03-14 DIAGNOSIS — J432 Centrilobular emphysema: Secondary | ICD-10-CM

## 2022-03-14 DIAGNOSIS — M2550 Pain in unspecified joint: Secondary | ICD-10-CM

## 2022-03-14 DIAGNOSIS — R269 Unspecified abnormalities of gait and mobility: Secondary | ICD-10-CM | POA: Diagnosis not present

## 2022-03-14 DIAGNOSIS — I824Z1 Acute embolism and thrombosis of unspecified deep veins of right distal lower extremity: Secondary | ICD-10-CM | POA: Diagnosis not present

## 2022-03-14 DIAGNOSIS — S73005D Unspecified dislocation of left hip, subsequent encounter: Secondary | ICD-10-CM

## 2022-03-14 DIAGNOSIS — I700 Atherosclerosis of aorta: Secondary | ICD-10-CM

## 2022-03-14 DIAGNOSIS — N179 Acute kidney failure, unspecified: Secondary | ICD-10-CM | POA: Diagnosis not present

## 2022-03-14 DIAGNOSIS — I10 Essential (primary) hypertension: Secondary | ICD-10-CM | POA: Diagnosis not present

## 2022-03-14 DIAGNOSIS — N184 Chronic kidney disease, stage 4 (severe): Secondary | ICD-10-CM

## 2022-03-14 DIAGNOSIS — M25473 Effusion, unspecified ankle: Secondary | ICD-10-CM

## 2022-03-14 NOTE — Progress Notes (Signed)
Provider:  Alain Honey, MD Location:      Place of Service:     PCP: Cassandria Anger, MD Patient Care Team: Cassandria Anger, MD as PCP - General (Internal Medicine) Leonie Man, MD as PCP - Cardiology (Cardiology) Leonie Man, MD as Consulting Physician (Cardiology) Gavin Pound, MD as Consulting Physician (Rheumatology) Armbruster, Carlota Raspberry, MD as Consulting Physician (Gastroenterology) Tanda Rockers, MD as Consulting Physician (Pulmonary Disease) Grace Isaac, MD (Inactive) as Consulting Physician (Cardiothoracic Surgery) Aloha Gell, MD as Consulting Physician (Obstetrics and Gynecology) Plotnikov, [name], MD Luretha Rued, RN as [place] Management  Extended Emergency Contact Information Primary Emergency Contact: [name] Address: [address]           [place], [place] [address] Johnnette Litter of [place] Phone: [phone] Mobile Phone: [phone] Relation: Spouse Secondary Emergency Contact: [name],[name] Address: [address]  [address] Johnnette Litter of [place] Phone: [phone] Relation: Friend  Code Status:  Goals of Care: Advanced Directive information    03/10/2022   11:15 AM  Advanced Directives  Does Patient Have a Medical Advance Directive? No  Would patient like information on creating a medical advance directive? No - Patient declined      No chief complaint on file.   HPI: Patient is a 81 y.o. female seen today for admission to [facility] after a brief hospitalization from November 6 to March 09, 2022 for dislocation of left hip.  She had had a recent hip arthroplasty and was turning to put on some clothing and the hip dislocated.  She was sent to the hospital where it was reduced in the emergency room.  Postreduction pain was minimal. Other issues include recent pulmonary embolus, placed on Eliquis but then developed what  was felt to be a diverticular GI bleed and she was recently hospitalized for that as well. Also she had lower extremity edema felt to be due to amlodipine use.  That was discontinued in favor of Coreg and hydralazine.  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 Most likely scar. Mild subendocardial ischemia. EF 71% LOW RISK.    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 Charlestine Night   Rheumatoid arthritis [facility]) 2011   Dr.Truslow; fracture knees, hands and wrists -    S/P CABG x 1 1996   CABG--LIMA-LAD for ostial LAD (not felt to be PCI amenable). EF NORMAL then; LIMA now atretic   Shortness of breath dyspnea    with exertion   Stroke ([place]) 11/2014   TIA    Urinary frequency    Past Surgical History:  Procedure Laterality Date   ABDOMINAL AORTIC ANEURYSM REPAIR  4098   Complicated by mesenteric artery stenosis and splenic artery infarction with acquired  Asplenia   APPENDECTOMY     BUNIONECTOMY  07/2011   right foot   CARDIAC CATHETERIZATION  2005   (Most recent CATH) - ostial LAD lesion 20-30% (down from 90% initially). Atretic LIMA. Minimal disease the RCA and Circumflex system.   CARPAL TUNNEL RELEASE Left    CATARACT EXTRACTION Bilateral    CERVICAL SPINE SURGERY     plate 2008 Dr. Saintclair Halsted   COLONOSCOPY     CORONARY ARTERY BYPASS GRAFT  1996   INCLUDED AN INTERNAL MAMMARY ARTERY TO THE LAD. EF WAS NORMAL   INGUINAL HERNIA REPAIR Right    LAPAROSCOPIC APPENDECTOMY N/A 06/28/2016   Procedure: APPENDECTOMY LAPAROSCOPIC;  Surgeon: Leighton Ruff, MD;  Location: [facility];  Service: General;  Laterality: N/A;   NM MYOVIEW LTD  06/2010   Fixed anteroseptal, apical and inferoapical defect with moderate size. Most likely scar. Mild subendocardial ischemia. EF 71% LOW RISK.    SPLENECTOMY     TOTAL HIP ARTHROPLASTY Left 01/23/2022   Procedure: LEFT TOTAL HIP ARTHROPLASTY ANTERIOR APPROACH;  Surgeon: Leandrew Koyanagi, MD;  Location: [facility];  Service: Orthopedics;  Laterality: Left;  3-C   TRANSTHORACIC ECHOCARDIOGRAM  12/2014   [facility]: Normal LV size & function. EF 55-60%,    vagina polyp     VIDEO ASSISTED THORACOSCOPY (VATS)/WEDGE RESECTION Left 03/02/2015   Procedure: VIDEO ASSISTED THORACOSCOPY (VATS), MINI THORACOTOMY, LEFT UPPER LOBE WEDGE, TAKE DOWN OF INTERNAL MAMMARY LESIONS, PLACEMENT OF ON-Q PUMP;  Surgeon: Grace Isaac, MD;  Location: [facility];  Service: Thoracic;  Laterality: Left;   VIDEO BRONCHOSCOPY N/A 03/02/2015   Procedure: BRONCHOSCOPY;  Surgeon: Grace Isaac, MD;  Location: [facility];  Service: Thoracic;  Laterality: N/A;   VIDEO BRONCHOSCOPY WITH ENDOBRONCHIAL NAVIGATION N/A 10/08/2017   Procedure: VIDEO BRONCHOSCOPY WITH ENDOBRONCHIAL NAVIGATION WITH BIOPSIES OF LEFT UPPER LOBE AND LEFT LOWER LOBE;  Surgeon: Grace Isaac, MD;  Location: [facility];  Service: Thoracic;  Laterality: N/A;    reports that she quit  smoking about 63 years ago. Her smoking use included cigarettes. She has never been exposed to tobacco smoke. She has never used smokeless tobacco. She reports current alcohol use. She reports that she does not use drugs. Social History   Socioeconomic History   Marital status: Married    Spouse name: Not on file   Number of children: 4   Years of education: Not on file   Highest education level: Not on file  Occupational History   Occupation: retired    Fish farm manager: RETIRED  Tobacco Use   Smoking status: Former    Types: Cigarettes    Quit date: 12/04/1958    Years since quitting: 63.3    Passive exposure: Never   Smokeless tobacco: Never  Vaping Use   Vaping Use: Never used  Substance and Sexual Activity   Alcohol use: Yes    Comment: hardly ever  Drug use: No   Sexual activity: Not Currently  Other Topics Concern   Not on file  Social History Narrative   [facility]  [place] ; [place].    Living at [place] since dec 2015   Social Determinants of Health   Financial Resource Strain: Low Risk  (05/30/2021)   Overall Financial Resource Strain (CARDIA)    Difficulty of Paying Living Expenses: Not hard at all  Food Insecurity: No Food Insecurity (01/23/2022)   Hunger Vital Sign    Worried About Running Out of Food in the Last Year: Never true    Ran Out of Food in the Last Year: Never true  Transportation Needs: No Transportation Needs (01/23/2022)   PRAPARE - Hydrologist (Medical): No    Lack of Transportation (Non-Medical): No  Physical Activity: Insufficiently Active (05/30/2021)   Exercise Vital Sign    Days of Exercise per Week: 3 days    Minutes of Exercise per Session: 30 min  Stress: No Stress Concern Present (05/30/2021)   [place]    Feeling of Stress : Not at all  Social Connections: Moderately Integrated (05/30/2021)    Social Connection and Isolation Panel [NHANES]    Frequency of Communication with Friends and Family: Twice a week    Frequency of Social Gatherings with Friends and Family: Twice a week    Attends Religious Services: Never    Marine scientist or Organizations: Yes    Attends Music therapist: More than 4 times per year    Marital Status: Married  Human resources officer Violence: Not At Risk (01/23/2022)   Humiliation, Afraid, Rape, and Kick questionnaire    Fear of Current or Ex-Partner: No    Emotionally Abused: No    Physically Abused: No    Sexually Abused: No    Functional Status Survey:    Family History  Problem Relation Age of Onset   Hypertension Mother    Diabetes Mother    Heart disease Mother    Hyperlipidemia Mother    Stroke Father    Hyperlipidemia Sister    Hypertension Sister    Hypertension Daughter    Cancer Paternal Uncle        Deceased from cancer not sure of site   Hypertension Son    Hyperlipidemia Son    Hyperlipidemia Son    Hypertension Son    Hyperlipidemia Son    Hypertension Son    Hypertension Other    Coronary artery disease Other    Asthma Neg Hx    Colon cancer Neg Hx     Health Maintenance  Topic Date Due   COLONOSCOPY (Pts 45-29yr Insurance coverage will need to be confirmed)  03/30/2019   COVID-19 Vaccine (5 - Mixed Product risk series) 12/07/2021   Medicare Annual Wellness (AWV)  05/30/2022   TETANUS/TDAP  06/01/2026   Pneumonia Vaccine 81 Years old  Completed   INFLUENZA VACCINE  Completed   DEXA SCAN  Completed   Zoster Vaccines- Shingrix  Completed   HPV VACCINES  Aged Out    Allergies  Allergen Reactions   Nsaids Other (See Comments)    Stomach upset Told to avoid due to Eliquis   Aspirin Other (See Comments)    Stomach upset Told to avoid due to Eliquis   Codeine Nausea And Vomiting   Nitrostat [Nitroglycerin] Other (See Comments)  Bradycardia. Drop in heart rate     Outpatient Encounter  Medications as of 03/14/2022  Medication Sig   acetaminophen (TYLENOL) 325 MG tablet Take 650 mg by mouth 2 (two) times daily as needed (generalized pain).   albuterol (VENTOLIN HFA) 108 (90 Base) MCG/ACT inhaler Inhale 1-2 puffs into the lungs every 4 (four) hours as needed for wheezing or shortness of breath.   amLODipine (NORVASC) 5 MG tablet Take 1 tablet (5 mg total) by mouth daily.   apixaban (ELIQUIS) 2.5 MG TABS tablet Take 1 tablet (2.5 mg total) by mouth 2 (two) times daily.   ascorbic acid (VITAMIN C) 500 MG tablet Take 500 mg by mouth daily.   atorvastatin (LIPITOR) 80 MG tablet Take 80 mg by mouth daily.   carvedilol (COREG) 6.25 MG tablet Take 1 tablet (6.25 mg total) by mouth 2 (two) times daily with a meal.   Cholecalciferol (VITAMIN D3) 50 MCG (2000 UT) capsule Take 1 capsule (2,000 Units total) by mouth daily.   Cyanocobalamin 2500 MCG TABS Take 2,500 mcg by mouth daily.   docusate sodium (COLACE) 100 MG capsule Take 1-2 capsules (100-200 mg total) by mouth daily as needed for mild constipation.   furosemide (LASIX) 40 MG tablet Take 1 tablet (40 mg total) by mouth daily.   gabapentin (NEURONTIN) 300 MG capsule Take 1 capsule (300 mg total) by mouth at bedtime.   Golimumab 50 MG/0.5ML SOAJ Take 50 mg by mouth every 2 (two) months. Hold for the next 2 weeks per Orthopedic surgery recommendations.   leflunomide (ARAVA) 20 MG tablet Take 20 mg by mouth daily.    levothyroxine (SYNTHROID) 88 MCG tablet TAKE 1 TABLET (88 MCG TOTAL) BY MOUTH DAILY BEFORE BREAKFAST.   Multiple Vitamins-Minerals (CENTRUM ADULT PO) Take 1 tablet by mouth daily. 1 Tablet Daily.   pantoprazole (PROTONIX) 40 MG tablet Take 1 tablet (40 mg total) by mouth 2 (two) times daily.   polyethylene glycol (MIRALAX / GLYCOLAX) 17 g packet Take 17 g by mouth daily as needed.   potassium chloride SA (KLOR-CON M) 10 MEQ tablet Take 1 tablet (10 mEq total) by mouth daily.   Tiotropium Bromide-Olodaterol (STIOLTO  RESPIMAT) 2.5-2.5 MCG/ACT AERS Inhale 2 each into the lungs at bedtime.   triamcinolone (NASACORT) 55 MCG/ACT AERO nasal inhaler Place 1 spray into the nose daily.   No facility-administered encounter medications on file as of 03/14/2022.    Review of Systems  Constitutional:  Positive for activity change and fatigue.  HENT: Negative.    Respiratory: Negative.    Cardiovascular:  Positive for leg swelling.  Gastrointestinal:  Positive for blood in stool. Negative for abdominal pain.  Genitourinary:  Positive for flank pain.  Musculoskeletal:  Positive for arthralgias and gait problem.  Skin: Negative.   Hematological:  Bruises/bleeds easily.  Psychiatric/Behavioral: Negative.    All other systems reviewed and are negative.   Vitals:   03/14/22 1018  BP: (!) 148/68  Pulse: 93  Resp: 17  Temp: 97.6 F (36.4 C)   There is no height or weight on file to calculate BMI. Physical Exam Vitals and nursing note reviewed.  Constitutional:      Appearance: Normal appearance.  Cardiovascular:     Rate and Rhythm: Normal rate and regular rhythm.     Heart sounds: Normal heart sounds.  Pulmonary:     Effort: Pulmonary effort is normal.     Breath sounds: Normal breath sounds.  Musculoskeletal:     Comments: Patient with  immobilizer on left knee per orthopedics. Uses walker for ambulation  Skin:    General: Skin is warm.  Neurological:     General: No focal deficit present.     Mental Status: She is alert and oriented to person, place, and time.     Labs reviewed: Basic Metabolic Panel: Recent Labs    03/07/22 0310 03/08/22 0650 03/09/22 0540  NA 141 140 138  K 3.8 3.6 3.4*  CL 101 100 100  CO2 '26 28 27  '$ GLUCOSE 116* 105* 109*  BUN 37* 40* 44*  CREATININE 2.23* 2.44* 2.55*  CALCIUM 9.0 9.4 9.5  MG 2.3 1.9 1.8   Liver Function Tests: Recent Labs    02/06/22 1529 02/07/22 0315 02/14/22 0000 02/16/22 1205  AST 63* 48* 28 25  ALT 36 '29 18 19  '$ ALKPHOS 123 110  108 74  BILITOT 0.5 0.7  --  0.3  PROT 6.5 5.2*  --  4.8*  ALBUMIN 2.4* 2.1* 3.1* 2.3*   No results for input(s): "LIPASE", "AMYLASE" in the last 8760 hours. No results for input(s): "AMMONIA" in the last 8760 hours. CBC: Recent Labs    03/07/22 0310 03/08/22 0650 03/09/22 0540  WBC 7.9 9.8 9.9  NEUTROABS 4.2 4.9 5.5  HGB 10.0* 9.6* 9.6*  HCT 30.7* 28.4* 28.5*  MCV 92.2 88.2 90.2  PLT 391 417* 421*   Cardiac Enzymes: No results for input(s): "CKTOTAL", "CKMB", "CKMBINDEX", "TROPONINI" in the last 8760 hours. BNP: Invalid input(s): "POCBNP" No results found for: "HGBA1C" Lab Results  Component Value Date   TSH 0.985 02/16/2022   Lab Results  Component Value Date   VITAMINB12 2,000 02/14/2022   Lab Results  Component Value Date   FOLATE 11.5 09/04/2012   Lab Results  Component Value Date   IRON 104 02/16/2022   TIBC 266 02/16/2022   FERRITIN 128 02/16/2022    Imaging and Procedures obtained prior to SNF admission: DG Ankle Complete Left  Result Date: 03/07/2022 CLINICAL DATA:  Left ankle pain.  Hip dislocation. EXAM: LEFT ANKLE COMPLETE - 3+ VIEW COMPARISON:  None Available. FINDINGS: There is no evidence of fracture, dislocation, or joint effusion. There is no evidence of arthropathy or other focal bone abnormality. Soft tissues are unremarkable. Partially imaged splint and bandage over the distal leg/ankle. IMPRESSION: No acute fracture or dislocation. Electronically Signed   By: Keane Police D.O.   On: 03/07/2022 10:37   DG Pelvis Portable  Result Date: 03/06/2022 CLINICAL DATA:  Reduction of dislocation EXAM: PORTABLE PELVIS 1-2 VIEWS COMPARISON:  01/23/2022 FINDINGS: No recent fracture or dislocation is seen. There is previous left hip arthroplasty. There is interval clearing of pockets of air from the soft tissues since 01/23/2022. SI joints are symmetrical. Degenerative changes are noted in visualized lower lumbar spine. IMPRESSION: Previous left hip  arthroplasty. No recent fracture or dislocation is seen. Lumbar spondylosis. Electronically Signed   By: Elmer Picker M.D.   On: 03/06/2022 14:57   DG Chest Port 1 View  Result Date: 03/06/2022 CLINICAL DATA:  Recent hip fracture complicated by DVT and PE, history of COPD. EXAM: PORTABLE CHEST 1 VIEW COMPARISON:  Chest radiograph 02/16/2022 FINDINGS: Median sternotomy wires and mediastinal surgical clips are again seen. The heart is mildly enlarged, unchanged. The upper mediastinal contours are stable. There are small bilateral pleural effusions with adjacent airspace opacities, left worse than right and worsened since 02/16/2022. The upper lobes are well aerated. There is no pneumothorax There is no acute osseous abnormality.  IMPRESSION: Small left larger than right pleural effusions with adjacent bibasilar airspace opacities, worsened since 02/16/2022. Electronically Signed   By: Valetta Mole M.D.   On: 03/06/2022 11:24   DG HIP UNILAT WITH PELVIS 1V LEFT  Result Date: 03/06/2022 CLINICAL DATA:  Postreduction left hip.  [id number]. EXAM: DG HIP (WITH OR WITHOUT PELVIS) 1V*L* COMPARISON:  Earlier study today at 1:51 a.m. FINDINGS: Single AP view reveals relocation of the left femoral head prosthesis into the acetabular cup prosthesis. Evidence of fractures is not seen. IMPRESSION: Relocation of the left femoral head prosthesis into the acetabular cup prosthesis. Electronically Signed   By: Telford Nab M.D.   On: 03/06/2022 04:33   DG Hip Unilat With Pelvis 2-3 Views Left  Result Date: 03/06/2022 CLINICAL DATA:  Left hip pain. EXAM: DG HIP (WITH OR WITHOUT PELVIS) 2-3V LEFT COMPARISON:  None Available. FINDINGS: A total left hip replacement is seen with dorsal dislocation of the left femoral prosthesis. There is no evidence of an acute fracture. Soft tissue structures are unremarkable. IMPRESSION: Total left hip replacement with dorsal dislocation of the left femoral prosthesis. Electronically  Signed   By: Virgina Norfolk M.D.   On: 03/06/2022 02:11    Assessment/Plan 1. Acute renal failure superimposed on stage 4 chronic kidney disease, unspecified acute renal failure type ([place]) When patient was hospitalized she was fluid overloaded.  She was diuresed.  Creatinine remains around 2.  Followed by nephrology  2. Ankle swelling, unspecified laterality Thought to be due largely to amlodipine.  This was discontinued  3. Aortic atherosclerosis ([place]) Patient continues on statin and blood pressure medication  4. Arthralgia, unspecified joint By rheumatology.  On biologic agent.  No complete plaints currently    6. Centrilobular emphysema (HCC) Followed by pulmonary.  Continue on stiletto risks per [facility]  7. Deep vein thrombosis (DVT) of distal vein of right lower extremity, unspecified chronicity ([place]) Denied PE continues on Eliquis which will be indefinite  8. Essential hypertension Sugar doing well on Coreg and hydralazine  9. Gait disorder Related to hip prosthesis and subsequent dislocation.  Currently wearing the immobilizer and will receive therapy here in skilled facility  10. Dislocation of left hip, subsequent encounter See above    Family/ staff Communication:   Labs/tests ordered: per NP  Lillette Boxer. Sabra Heck, [place] [address] [place], [place] Office [phone]

## 2022-03-16 LAB — CBC AND DIFFERENTIAL
HCT: 26 — AB (ref 36–46)
Hemoglobin: 8.7 — AB (ref 12.0–16.0)
Platelets: 424 10*3/uL — AB (ref 150–400)
WBC: 10.3

## 2022-03-16 LAB — CBC: RBC: 2.9 — AB (ref 3.87–5.11)

## 2022-03-17 ENCOUNTER — Encounter: Payer: Self-pay | Admitting: Nurse Practitioner

## 2022-03-17 ENCOUNTER — Ambulatory Visit (INDEPENDENT_AMBULATORY_CARE_PROVIDER_SITE_OTHER): Payer: Medicare PPO | Admitting: Orthopaedic Surgery

## 2022-03-17 ENCOUNTER — Non-Acute Institutional Stay (SKILLED_NURSING_FACILITY): Payer: Medicare PPO | Admitting: Nurse Practitioner

## 2022-03-17 DIAGNOSIS — K219 Gastro-esophageal reflux disease without esophagitis: Secondary | ICD-10-CM | POA: Diagnosis not present

## 2022-03-17 DIAGNOSIS — N184 Chronic kidney disease, stage 4 (severe): Secondary | ICD-10-CM

## 2022-03-17 DIAGNOSIS — K5901 Slow transit constipation: Secondary | ICD-10-CM | POA: Diagnosis not present

## 2022-03-17 DIAGNOSIS — M069 Rheumatoid arthritis, unspecified: Secondary | ICD-10-CM

## 2022-03-17 DIAGNOSIS — E039 Hypothyroidism, unspecified: Secondary | ICD-10-CM | POA: Diagnosis not present

## 2022-03-17 DIAGNOSIS — I824Z1 Acute embolism and thrombosis of unspecified deep veins of right distal lower extremity: Secondary | ICD-10-CM

## 2022-03-17 DIAGNOSIS — E559 Vitamin D deficiency, unspecified: Secondary | ICD-10-CM

## 2022-03-17 DIAGNOSIS — K922 Gastrointestinal hemorrhage, unspecified: Secondary | ICD-10-CM

## 2022-03-17 DIAGNOSIS — G459 Transient cerebral ischemic attack, unspecified: Secondary | ICD-10-CM | POA: Diagnosis not present

## 2022-03-17 DIAGNOSIS — K8020 Calculus of gallbladder without cholecystitis without obstruction: Secondary | ICD-10-CM

## 2022-03-17 DIAGNOSIS — M1612 Unilateral primary osteoarthritis, left hip: Secondary | ICD-10-CM

## 2022-03-17 DIAGNOSIS — I2699 Other pulmonary embolism without acute cor pulmonale: Secondary | ICD-10-CM

## 2022-03-17 DIAGNOSIS — S73005D Unspecified dislocation of left hip, subsequent encounter: Secondary | ICD-10-CM

## 2022-03-17 DIAGNOSIS — I10 Essential (primary) hypertension: Secondary | ICD-10-CM

## 2022-03-17 DIAGNOSIS — D62 Acute posthemorrhagic anemia: Secondary | ICD-10-CM

## 2022-03-17 DIAGNOSIS — Z96642 Presence of left artificial hip joint: Secondary | ICD-10-CM

## 2022-03-17 DIAGNOSIS — I700 Atherosclerosis of aorta: Secondary | ICD-10-CM

## 2022-03-17 DIAGNOSIS — J432 Centrilobular emphysema: Secondary | ICD-10-CM

## 2022-03-17 DIAGNOSIS — M25473 Effusion, unspecified ankle: Secondary | ICD-10-CM

## 2022-03-17 DIAGNOSIS — E782 Mixed hyperlipidemia: Secondary | ICD-10-CM

## 2022-03-17 LAB — CBC AND DIFFERENTIAL
HCT: 27 — AB (ref 36–46)
Hemoglobin: 8.9 — AB (ref 12.0–16.0)
Platelets: 465 10*3/uL — AB (ref 150–400)
WBC: 10.3

## 2022-03-17 LAB — CBC: RBC: 2.94 — AB (ref 3.87–5.11)

## 2022-03-17 LAB — IRON,TIBC AND FERRITIN PANEL
Ferritin: 59
Iron: 38

## 2022-03-17 NOTE — Assessment & Plan Note (Addendum)
takes Golimumab,  Leflunomide, fx of knees, hands, wrists, cervical disc disease/fracture, takes Gabapentin PMR, Hx of

## 2022-03-17 NOTE — Progress Notes (Signed)
Office Visit Note   Patient: [name]           Date of Birth: October 13, 1940           MRN: [id number] Visit Date: 03/17/2022              Requested by: Cassandria Anger, MD [place],  [place] [id number] PCP: Plotnikov, [name], MD   Assessment & Plan: Visit Diagnoses:  1. Dislocation of left hip, subsequent encounter   2. Status post total replacement of left hip     Plan: [name] is returning today for follow-up status post dislocation of her left hip replacement.  She is back at her friend's home last in the skilled portion.  She has been doing physical therapy 5 times a week.  Overall doing better medically and physically.  The swelling in her legs have also improved.  Examination of the left hips surgical scar is healed.  No signs of infection.  The bilateral pitting edema is significantly improved although still present.  She has painless fluid range of motion of the hip.  Leg lengths are equal.  [name] seems to be in better spirits today and doing better overall.  We will continue posterior precautions for a full 3 months.  She should continue to do physical therapy 5 times a day.  We can discontinue the knee immobilizer at this time.  I have written a prescription for knee-high compression socks with zippers.  I would like to recheck her in 6 weeks with AP pelvis x-rays.  Follow-Up Instructions: Return in about 6 weeks (around 04/28/2022).   Orders:  No orders of the defined types were placed in this encounter.  No orders of the defined types were placed in this encounter.     Procedures: No procedures performed   Clinical Data: No additional findings.   Subjective: Chief Complaint  Patient presents with   Left Hip - Pain    HPI  Review of Systems   Objective: Vital Signs: There were no vitals taken for this visit.  Physical Exam  Ortho Exam  Specialty Comments:  No specialty comments available.  Imaging: No results  found.   PMFS History: Patient Active Problem List   Diagnosis Date Noted   Gallstones 03/17/2022   AKI (acute kidney injury) ([place]) 03/07/2022   Acute renal failure superimposed on stage 4 chronic kidney disease ([place]) 03/06/2022   High anion gap metabolic acidosis 81/27/5170   Hip dislocation, left ([place]) 03/06/2022   History of anemia due to chronic kidney disease 03/06/2022   History of DVT (deep vein thrombosis) 03/06/2022   Allergic rhinitis 03/06/2022   Lower GI bleed 02/16/2022   Tachycardia 02/06/2022   Pulmonary emboli ([place]) 02/06/2022   Urinary retention 01/30/2022   DVT (deep venous thrombosis) ([place]) 01/30/2022   Vitamin D deficiency 01/30/2022   Slow transit constipation 01/30/2022   GERD (gastroesophageal reflux disease) 01/30/2022   Status post total replacement of right hip 01/29/2022   Status post total replacement of left hip 01/23/2022   Lower GI bleeding 11/18/2021   Hematochezia    Acute blood loss anemia (ABLA)    BRBPR (bright red blood per rectum) 11/17/2021   CKD (chronic kidney disease) stage 4, GFR 15-29 ml/min (HCC) 11/17/2021   Primary osteoarthritis of left hip 10/06/2021   Left hip pain 12/09/2020   Aortic atherosclerosis ([place]) 09/07/2020   Gait disorder 06/08/2020   Neoplasm of uncertain behavior of skin 03/14/2020  Contusion of face 03/10/2020   Arthralgia 12/25/2019   Ankle swelling 08/08/2019   Hyperkalemia 05/15/2019   Osteoporosis 02/26/2018   Colon polyp 06/28/2016   COPD (chronic obstructive pulmonary disease) ([place]) 05/28/2016   Upper airway cough syndrome 05/22/2016   Wrist pain, right 12/14/2015   Lung mass 03/02/2015   Pre-operative cardiovascular examination 02/21/2015   DOE (dyspnea on exertion) 02/16/2015   TIA (transient ischemic attack) 12/31/2014   Abnormal mental state 12/30/2014   Diarrhea 11/26/2014   Well adult exam 11/26/2014   Seasonal allergies 12/31/2013   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   Cerumen  impaction 03/20/2013   Hyperlipidemia    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   Long term (current) use of anticoagulants 11/08/2010   Rheumatoid arthritis ([place]) 06/23/2010   Pain in joint 11/26/2009   Acquired hypothyroidism 12/08/2006   Macular degeneration (senile) of retina 12/08/2006   Essential hypertension 12/08/2006   Diaphragmatic hernia 12/08/2006   ASPLENIA 12/08/2006   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) 12/22/1994    Class: History of   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; now atretic and significantly improved LAD lesion without intervention 12/22/1994    Class: History of   Past Medical History:  Diagnosis Date   Abdominal aortic aneurysm ([place])    REPAIRED IN 1996 BY [name]  AND HAS RECENTLY BEEN FOLLOWED BY [name]   Acquired asplenia     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 Most likely scar. Mild subendocardial ischemia. EF 71% LOW RISK.    Cervical disc disease    fracture   Chronic kidney disease    COPD (chronic obstructive pulmonary disease) (HCC)    Diverticulosis    DVT (deep venous thrombosis) ([place])    RLE; Sept '23   History of pulmonary embolus ([facility])    Oct '23   Hyperlipidemia    Hypertension    Hypothyroidism (acquired)    hypo   Myocardial infarction ([place]) 11/2014   TIA   Polymyalgia rheumatica ([place])    2011 Dr. Charlestine Night   Rheumatoid arthritis [facility]) 2011   Dr.Truslow; fracture knees, hands and wrists -    S/P CABG x 1 1996    CABG--LIMA-LAD for ostial LAD (not felt to be PCI amenable). EF NORMAL then; LIMA now atretic   Shortness of breath dyspnea    with exertion   Stroke ([place]) 11/2014   TIA    Urinary frequency     Family History  Problem Relation Age of Onset   Hypertension Mother    Diabetes Mother    Heart disease Mother    Hyperlipidemia Mother    Stroke Father    Hyperlipidemia Sister    Hypertension Sister    Hypertension Daughter    Cancer Paternal Uncle        Deceased from cancer not sure of site   Hypertension Son    Hyperlipidemia Son    Hyperlipidemia Son  Hypertension Son    Hyperlipidemia Son    Hypertension Son    Hypertension Other    Coronary artery disease Other    Asthma Neg Hx    Colon cancer Neg Hx     Past Surgical History:  Procedure Laterality Date   ABDOMINAL AORTIC ANEURYSM REPAIR  2423   Complicated by mesenteric artery stenosis and splenic artery infarction with acquired Asplenia   APPENDECTOMY     BUNIONECTOMY  07/2011   right foot   CARDIAC CATHETERIZATION  2005   (Most recent CATH) - ostial LAD lesion 20-30% (down from 90% initially). Atretic LIMA. Minimal disease the RCA and Circumflex system.   CARPAL TUNNEL RELEASE Left    CATARACT EXTRACTION Bilateral    CERVICAL SPINE SURGERY     plate 2008 Dr. Saintclair Halsted   COLONOSCOPY     CORONARY ARTERY BYPASS GRAFT  1996   INCLUDED AN INTERNAL MAMMARY ARTERY TO THE LAD. EF WAS NORMAL   INGUINAL HERNIA REPAIR Right    LAPAROSCOPIC APPENDECTOMY N/A 06/28/2016   Procedure: APPENDECTOMY LAPAROSCOPIC;  Surgeon: Leighton Ruff, MD;  Location: [facility];  Service: General;  Laterality: N/A;   NM MYOVIEW LTD  06/2010   Fixed anteroseptal, apical and inferoapical defect with moderate size. Most likely scar. Mild subendocardial ischemia. EF 71% LOW RISK.    SPLENECTOMY     TOTAL HIP ARTHROPLASTY Left 01/23/2022   Procedure: LEFT TOTAL HIP ARTHROPLASTY ANTERIOR APPROACH;  Surgeon: Leandrew Koyanagi, MD;  Location: [place];  Service:  Orthopedics;  Laterality: Left;  3-C   TRANSTHORACIC ECHOCARDIOGRAM  12/2014   [facility]: Normal LV size & function. EF 55-60%,    vagina polyp     VIDEO ASSISTED THORACOSCOPY (VATS)/WEDGE RESECTION Left 03/02/2015   Procedure: VIDEO ASSISTED THORACOSCOPY (VATS), MINI THORACOTOMY, LEFT UPPER LOBE WEDGE, TAKE DOWN OF INTERNAL MAMMARY LESIONS, PLACEMENT OF ON-Q PUMP;  Surgeon: Grace Isaac, MD;  Location: [place];  Service: Thoracic;  Laterality: Left;   VIDEO BRONCHOSCOPY N/A 03/02/2015   Procedure: BRONCHOSCOPY;  Surgeon: Grace Isaac, MD;  Location: [place];  Service: Thoracic;  Laterality: N/A;   VIDEO BRONCHOSCOPY WITH ENDOBRONCHIAL NAVIGATION N/A 10/08/2017   Procedure: VIDEO BRONCHOSCOPY WITH ENDOBRONCHIAL NAVIGATION WITH BIOPSIES OF LEFT UPPER LOBE AND LEFT LOWER LOBE;  Surgeon: Grace Isaac, MD;  Location: [place];  Service: Thoracic;  Laterality: N/A;   Social History   Occupational History   Occupation: retired    Fish farm manager: RETIRED  Tobacco Use   Smoking status: Former    Types: Cigarettes    Quit date: 12/04/1958    Years since quitting: 63.3    Passive exposure: Never   Smokeless tobacco: Never  Vaping Use   Vaping Use: Never used  Substance and Sexual Activity   Alcohol use: Yes    Comment: hardly ever   Drug use: No   Sexual activity: Not Currently

## 2022-03-17 NOTE — Assessment & Plan Note (Addendum)
take Vit B12, Vit B12>2000, Iron 77 02/14/22, Hgb 9.6 03/09/22>>8.7 03/16/22, empirical Fe supplement 3x/wk, update CBC, Fe, Ferritin, FOBTx3 Hospitalized 02/16/22-02/22/22 rectal bleed, external hemorrhoids, gb 6.1, PRBC x2 units transfused, f/u GI 03/17/22 Iron 38, wbc 10.3, Hgb 8.9, plt 465, neutrophils 44.6% 03/19/22 +FOBT

## 2022-03-17 NOTE — Assessment & Plan Note (Signed)
TIA/Stroke, on Atorvastatin, Eliquis.

## 2022-03-17 NOTE — Assessment & Plan Note (Signed)
Hospitalized 02/06/22-02/11/22 for acute PE R middle lobe,  on Eliquis now, treat with shortest course possible, dc Eliquis if bleeds again

## 2022-03-17 NOTE — Assessment & Plan Note (Addendum)
takes Pantoprazole, prn Zofran, 03/10/22 X-ray abd normal abd.

## 2022-03-17 NOTE — Assessment & Plan Note (Signed)
takes Colace, MiraLax

## 2022-03-17 NOTE — Assessment & Plan Note (Signed)
BLE, on Furosemide

## 2022-03-17 NOTE — Assessment & Plan Note (Signed)
Bun/creat 44/2.55 03/09/22,  followed by Nephrology

## 2022-03-17 NOTE — Assessment & Plan Note (Signed)
deficiency, takes Vit D

## 2022-03-17 NOTE — Assessment & Plan Note (Signed)
takes Atorvastatin, LDL 74 09/07/20

## 2022-03-17 NOTE — Assessment & Plan Note (Signed)
takes Levothyroxine, TSH 2.35 06/21/21

## 2022-03-17 NOTE — Assessment & Plan Note (Signed)
Blood pressure is controlled,  takes Coreg, Amlodipine

## 2022-03-17 NOTE — Assessment & Plan Note (Signed)
prn Albuterol ACT, Stiolto, Triamcinolone nasal spray, O2 2lpm

## 2022-03-17 NOTE — Assessment & Plan Note (Signed)
01/23/22 s/p total replacement of L hip, f/u Dr Erlinda Hong. Hospitalized 03/06/22-03/09/22, fell, left hip posterior dislocation, reduced in ER 03/06/22, followed by Ortho, left knee immobilizer use, [facility] for therapy

## 2022-03-17 NOTE — Assessment & Plan Note (Signed)
01/28/22 [place] age indeterminate deep vein thrombosis  involving the right peroneal veins, on Eliquis

## 2022-03-17 NOTE — Assessment & Plan Note (Signed)
s/p CABG x1, LIMA LAD for ostial LAD, EF wnl. S/p aneurysm repair

## 2022-03-17 NOTE — Assessment & Plan Note (Signed)
03/15/22 [place] small gallstones

## 2022-03-17 NOTE — Progress Notes (Signed)
Location:   SNF [place] Room Number: 67H Place of Service:  SNF (31) Provider: Lennie Odor Quentin Shorey NP  Plotnikov, [name], MD  Patient Care Team: Cassandria Anger, MD as PCP - General (Internal Medicine) Leonie Monia Timmers, MD as PCP - Cardiology (Cardiology) Leonie Tyneshia Stivers, MD as Consulting Physician (Cardiology) Gavin Pound, MD as Consulting Physician (Rheumatology) Armbruster, Carlota Raspberry, MD as Consulting Physician (Gastroenterology) Tanda Rockers, MD as Consulting Physician (Pulmonary Disease) Grace Isaac, MD (Inactive) as Consulting Physician (Cardiothoracic Surgery) Aloha Gell, MD as Consulting Physician (Obstetrics and Gynecology) Plotnikov, [name], MD Luretha Rued, RN as [place] Management  Extended Emergency Contact Information Primary Emergency Contact: [name] Address: [address]           [place], [place] [address] [place] of [place] Phone: [phone] Mobile Phone: [phone] Relation: Spouse Secondary Emergency Contact: [name],[name] Address: [address] [place] [address] [place] Phone: [phone] Relation: Friend  Code Status: DNR Goals of care: Advanced Directive information    03/10/2022   11:15 AM  Advanced Directives  Does Patient Have a Medical Advance Directive? No  Would patient like information on creating a medical advance directive? No - Patient declined     Chief Complaint  Patient presents with   Acute Visit    anemia    HPI:  Pt is a 81 y.o. female seen today for an acute visit for anemia, Hgb 8.7 03/16/22, scant blood seen on her brief, denied abd pain, blood in urine  Hospitalized 03/06/22-03/09/22, fell, left hip posterior dislocation, reduced in ER 03/06/22, followed by Ortho, left knee immobilizer use, SN[facility] for therapy             Hospitalized 02/16/22-02/22/22 rectal bleed, external hemorrhoids, gb 6.1, PRBC  x2 units transfused, f/u GI              Hospitalized 02/06/22-02/11/22 for acute PE R middle lobe,  on Eliquis now, treat with shortest course possible, dc Eliquis if bleeds again             01/23/22 s/p total replacement of L hip, f/u Dr Erlinda Hong.             DOE/COPD, prn Albuterol ACT, Stiolto, Triamcinolone nasal spray, O2 2lpm  BRBPR hemorrhoids, Hydrocortisone cream, avoid constipation             DVT 01/28/22 [place] age indeterminate deep vein thrombosis  involving the right peroneal veins, on Eliquis             HTN, takes Coreg, Amlodipine             HLD takes Atorvastatin, LDL 74 09/07/20             Vit D deficiency, takes Vit D             Anemia, take Vit B12, Vit B12>2000, Iron 77 02/14/22, Hgb 9.6 03/09/22>>8.7 03/16/22             Constipation, takes Colace, MiraLax             GERD, takes Pantoprazole, prn Zofran, 03/10/22 X-ray abd normal abd.              Hx of L GI diverticular bleed, on PPI             RA, takes Golimumab,  Leflunomide, fx of knees,  hands, wrists, cervical disc disease/fracture, takes Gabapentin             Hypothyroidism, takes Levothyroxine, TSH 2.35 06/21/21             CAD s/p CABG x1, LIMA LAD for ostial LAD, EF wnl. S/p aneurysm repair             CKD, Bun/creat 44/2.55 03/09/22,  followed by Nephrology             TIA/Stroke, on Atorvastatin, Eliquis.              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 Most likely scar. Mild subendocardial ischemia. EF 71% LOW RISK.    Cervical disc disease    fracture   Chronic kidney disease    COPD (chronic obstructive pulmonary disease) (HCC)    Diverticulosis    DVT (deep venous thrombosis) ([place])    RLE; Sept '23   History of pulmonary embolus (PE)    Oct '23   Hyperlipidemia    Hypertension    Hypothyroidism (acquired)    hypo   Myocardial infarction ([place]) 11/2014   TIA   Polymyalgia rheumatica ([place])    2011 Dr. Charlestine Night   Rheumatoid arthritis [facility]) 2011   Dr.Truslow; fracture knees, hands and wrists -    S/P CABG x 1 1996   CABG--LIMA-LAD for ostial LAD (not felt to be PCI amenable). 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 Atretic LIMA. Minimal disease the RCA and Circumflex system.   CARPAL TUNNEL RELEASE Left    CATARACT EXTRACTION Bilateral    CERVICAL SPINE SURGERY     plate 2008 Dr. Saintclair Halsted   COLONOSCOPY     CORONARY ARTERY BYPASS GRAFT  1996   INCLUDED AN INTERNAL MAMMARY ARTERY TO THE LAD. EF WAS NORMAL   INGUINAL HERNIA REPAIR Right    LAPAROSCOPIC APPENDECTOMY N/A 06/28/2016   Procedure: APPENDECTOMY LAPAROSCOPIC;  Surgeon: Leighton Ruff, MD;  Location: [facility];  Service: General;  Laterality: N/A;   NM MYOVIEW LTD  06/2010   Fixed anteroseptal, apical and inferoapical defect with moderate size. Most likely scar. Mild  subendocardial ischemia. EF 71% LOW RISK.    SPLENECTOMY      TOTAL HIP ARTHROPLASTY Left 01/23/2022   Procedure: LEFT TOTAL HIP ARTHROPLASTY ANTERIOR APPROACH;  Surgeon: Leandrew Koyanagi, MD;  Location: [facility];  Service: Orthopedics;  Laterality: Left;  3-C   TRANSTHORACIC ECHOCARDIOGRAM  12/2014   [facility]: Normal LV size & function. EF 55-60%,    vagina polyp     VIDEO ASSISTED THORACOSCOPY (VATS)/WEDGE RESECTION Left 03/02/2015   Procedure: VIDEO ASSISTED THORACOSCOPY (VATS), MINI THORACOTOMY, LEFT UPPER LOBE WEDGE, TAKE DOWN OF INTERNAL MAMMARY LESIONS, PLACEMENT OF ON-Q PUMP;  Surgeon: Grace Isaac, MD;  Location: [facility];  Service: Thoracic;  Laterality: Left;   VIDEO BRONCHOSCOPY N/A 03/02/2015   Procedure: BRONCHOSCOPY;  Surgeon: Grace Isaac, MD;  Location: [facility];  Service: Thoracic;  Laterality: N/A;   VIDEO BRONCHOSCOPY WITH ENDOBRONCHIAL NAVIGATION N/A 10/08/2017   Procedure: VIDEO BRONCHOSCOPY WITH ENDOBRONCHIAL NAVIGATION WITH BIOPSIES OF LEFT UPPER LOBE AND LEFT LOWER LOBE;  Surgeon: Grace Isaac, MD;  Location: [facility];  Service: Thoracic;  Laterality: N/A;    Allergies  Allergen Reactions   Nsaids Other (See Comments)    Stomach upset Told to avoid due to Eliquis   Aspirin Other (See Comments)    Stomach upset Told to avoid due to Eliquis   Codeine Nausea And Vomiting   Nitrostat [Nitroglycerin] Other (See Comments)    Bradycardia. Drop in heart rate     Allergies as of 03/17/2022       Reactions   Nsaids Other (See Comments)   Stomach upset Told to avoid due to Eliquis   Aspirin Other (See Comments)   Stomach upset Told to avoid due to Eliquis   Codeine Nausea And Vomiting   Nitrostat [nitroglycerin] Other (See Comments)   Bradycardia. Drop in heart rate         Medication List        Accurate as of March 17, 2022 12:03 PM. If you have any questions, ask your nurse or doctor.          acetaminophen 325 MG tablet Commonly known as: TYLENOL Take 650 mg by mouth 2 (two) times daily as  needed (generalized pain).   albuterol 108 (90 Base) MCG/ACT inhaler Commonly known as: VENTOLIN HFA Inhale 1-2 puffs into the lungs every 4 (four) hours as needed for wheezing or shortness of breath.   amLODipine 5 MG tablet Commonly known as: NORVASC Take 1 tablet (5 mg total) by mouth daily.   apixaban 2.5 MG Tabs tablet Commonly known as: ELIQUIS Take 1 tablet (2.5 mg total) by mouth 2 (two) times daily.   ascorbic acid 500 MG tablet Commonly known as: VITAMIN C Take 500 mg by mouth daily.   atorvastatin 80 MG tablet Commonly known as: LIPITOR Take 80 mg by mouth daily.   carvedilol 6.25 MG tablet Commonly known as: COREG Take 1 tablet (6.25 mg total) by mouth 2 (two) times daily with a meal.   CENTRUM ADULT PO Take 1 tablet by mouth daily. 1 Tablet Daily.   Cyanocobalamin 2500 MCG Tabs Take 2,500 mcg by mouth daily.   docusate sodium 100 MG capsule Commonly known as: Colace Take 1-2 capsules (100-200 mg total) by mouth daily as needed for mild constipation.   furosemide 40 MG tablet Commonly known as: LASIX Take 1 tablet (40 mg total) by mouth daily.   gabapentin 300 MG capsule Commonly known as: NEURONTIN Take 1 capsule (300  mg total) by mouth at bedtime.   Golimumab 50 MG/0.5ML Soaj Take 50 mg by mouth every 2 (two) months. Hold for the next 2 weeks per Orthopedic surgery recommendations.   leflunomide 20 MG tablet Commonly known as: ARAVA Take 20 mg by mouth daily.   levothyroxine 88 MCG tablet Commonly known as: SYNTHROID TAKE 1 TABLET (88 MCG TOTAL) BY MOUTH DAILY BEFORE BREAKFAST.   pantoprazole 40 MG tablet Commonly known as: PROTONIX Take 1 tablet (40 mg total) by mouth 2 (two) times daily.   polyethylene glycol 17 g packet Commonly known as: MIRALAX / GLYCOLAX Take 17 g by mouth daily as needed.   potassium chloride 10 MEQ tablet Commonly known as: KLOR-CON M Take 1 tablet (10 mEq total) by mouth daily.   Stiolto Respimat 2.5-2.5  MCG/ACT Aers Generic drug: Tiotropium Bromide-Olodaterol Inhale 2 each into the lungs at bedtime.   triamcinolone 55 MCG/ACT Aero nasal inhaler Commonly known as: NASACORT Place 1 spray into the nose daily.   Vitamin D3 50 MCG (2000 UT) capsule Take 1 capsule (2,000 Units total) by mouth daily.        Review of Systems  Constitutional:  Positive for activity change, appetite change and fatigue. Negative for fever.  HENT:  Negative for congestion and trouble swallowing.   Eyes:  Negative for visual disturbance.  Respiratory:  Positive for cough and shortness of breath. Negative for wheezing.        DOE  Cardiovascular:  Positive for leg swelling.  Gastrointestinal:  Positive for blood in stool. Negative for abdominal pain, constipation, nausea and vomiting.  Genitourinary:  Negative for difficulty urinating, dysuria and urgency.  Musculoskeletal:  Positive for arthralgias, back pain, gait problem and joint swelling.  Skin:  Negative for color change.  Neurological:  Negative for tremors and headaches.  Psychiatric/Behavioral:  Negative for behavioral problems and sleep disturbance. The patient is not nervous/anxious.     Immunization History  Administered Date(s) Administered   Fluad Quad(high Dose 65+) 02/03/2021, 01/16/2022   Influenza Whole 05/02/2001, 01/27/2008, 01/12/2010, 12/31/2010, 01/25/2012   Influenza, High Dose Seasonal PF 01/14/2014, 02/13/2015, 12/21/2016, 01/09/2018, 12/29/2018   Influenza,inj,Quad PF,6+ Mos 12/23/2015   Influenza-Unspecified 02/03/2013, 01/13/2014, 02/13/2015, 01/20/2020   Meningococcal Polysaccharide 03/21/2012   Moderna SARS-COV2 Booster Vaccination 03/15/2020   Moderna Sars-Covid-2 Vaccination 06/02/2019, 06/30/2019   PPD Test 11/18/2013   Pfizer Covid-19 Vaccine Bivalent Booster 75yr & up 01/19/2021, 10/12/2021   Pneumococcal Conjugate-13 03/20/2013   Pneumococcal Polysaccharide-23 05/02/2000, 06/23/2010, 02/26/2018   Td 05/01/2006    Tdap 06/01/2016   Zoster Recombinat (Shingrix) 05/16/2017, 07/20/2017   Zoster, Live 01/17/2006   Pertinent  Health Maintenance Due  Topic Date Due   COLONOSCOPY (Pts 45-491yrInsurance coverage will need to be confirmed)  03/30/2019   INFLUENZA VACCINE  Completed   DEXA SCAN  Completed      03/07/2022    8:00 PM 03/08/2022    4:00 PM 03/09/2022    3:00 AM 03/09/2022   10:10 AM 03/10/2022   11:15 AM  Fall Risk  Falls in the past year?     1  Was there an injury with Fall?     0  Fall Risk Category Calculator     1  Fall Risk Category     Low  Patient Fall Risk Level High fall risk High fall risk High fall risk High fall risk High fall risk  Patient at Risk for Falls Due to     History of fall(s);Impaired balance/gait  Fall risk Follow up     Falls evaluation completed   Functional Status Survey:    Vitals:   03/17/22 1032  BP: (!) 148/64  Pulse: 88  Resp: 17  Temp: 97.7 F (36.5 C)  SpO2: 96%  Weight: 150 lb 11.2 oz (68.4 kg)   Body mass index is 26.7 kg/m. Physical Exam Constitutional:      Comments: Exhausted easily.   HENT:     Head: Normocephalic and atraumatic.     Nose: Nose normal.     Mouth/Throat:     Mouth: Mucous membranes are moist.  Eyes:     Extraocular Movements: Extraocular movements intact.     Conjunctiva/sclera: Conjunctivae normal.     Pupils: Pupils are equal, round, and reactive to light.  Cardiovascular:     Rate and Rhythm: Normal rate and regular rhythm.     Heart sounds: No murmur heard. Pulmonary:     Effort: Pulmonary effort is normal.     Breath sounds: No wheezing, rhonchi or rales.  Abdominal:     General: Bowel sounds are normal.     Palpations: Abdomen is soft.     Tenderness: There is no abdominal tenderness. There is no right CVA tenderness, left CVA tenderness, guarding or rebound.  Genitourinary:    Comments: External hemorrhoids from previous examination.  Musculoskeletal:     Cervical back: Normal range of motion and  neck supple.     Right lower leg: Edema present.     Left lower leg: Edema present.     Comments: Moderate edema BLE L>R  Skin:    General: Skin is warm and dry.     Comments: L hip surgical scar  Neurological:     General: No focal deficit present.     Mental Status: She is alert and oriented to person, place, and time. Mental status is at baseline.     Motor: No weakness.     Gait: Gait abnormal.  Psychiatric:        Mood and Affect: Mood normal.        Behavior: Behavior normal.        Thought Content: Thought content normal.     Labs reviewed: Recent Labs    03/07/22 0310 03/08/22 0650 03/09/22 0540  NA 141 140 138  K 3.8 3.6 3.4*  CL 101 100 100  CO2 '26 28 27  '$ GLUCOSE 116* 105* 109*  BUN 37* 40* 44*  CREATININE 2.23* 2.44* 2.55*  CALCIUM 9.0 9.4 9.5  MG 2.3 1.9 1.8   Recent Labs    02/06/22 1529 02/07/22 0315 02/14/22 0000 02/16/22 1205  AST 63* 48* 28 25  ALT 36 '29 18 19  '$ ALKPHOS 123 110 108 74  BILITOT 0.5 0.7  --  0.3  PROT 6.5 5.2*  --  4.8*  ALBUMIN 2.4* 2.1* 3.1* 2.3*   Recent Labs    03/07/22 0310 03/08/22 0650 03/09/22 0540  WBC 7.9 9.8 9.9  NEUTROABS 4.2 4.9 5.5  HGB 10.0* 9.6* 9.6*  HCT 30.7* 28.4* 28.5*  MCV 92.2 88.2 90.2  PLT 391 417* 421*   Lab Results  Component Value Date   TSH 0.985 02/16/2022   No results found for: "HGBA1C" Lab Results  Component Value Date   CHOL 157 09/07/2020   HDL 59.30 09/07/2020   LDLCALC 74 09/07/2020   LDLDIRECT 162.1 10/28/2009   TRIG 121.0 09/07/2020   CHOLHDL 3 09/07/2020    Significant Diagnostic Results in last 30 days:  DG Ankle Complete Left  Result Date: 03/07/2022 CLINICAL DATA:  Left ankle pain.  Hip dislocation. EXAM: LEFT ANKLE COMPLETE - 3+ VIEW COMPARISON:  None Available. FINDINGS: There is no evidence of fracture, dislocation, or joint effusion. There is no evidence of arthropathy or other focal bone abnormality. Soft tissues are unremarkable. Partially imaged splint and  bandage over the distal leg/ankle. IMPRESSION: No acute fracture or dislocation. Electronically Signed   By: Keane Police D.O.   On: 03/07/2022 10:37   DG Pelvis Portable  Result Date: 03/06/2022 CLINICAL DATA:  Reduction of dislocation EXAM: PORTABLE PELVIS 1-2 VIEWS COMPARISON:  01/23/2022 FINDINGS: No recent fracture or dislocation is seen. There is previous left hip arthroplasty. There is interval clearing of pockets of air from the soft tissues since 01/23/2022. SI joints are symmetrical. Degenerative changes are noted in visualized lower lumbar spine. IMPRESSION: Previous left hip arthroplasty. No recent fracture or dislocation is seen. Lumbar spondylosis. Electronically Signed   By: Elmer Picker M.D.   On: 03/06/2022 14:57   DG Chest Port 1 View  Result Date: 03/06/2022 CLINICAL DATA:  Recent hip fracture complicated by DVT and PE, history of COPD. EXAM: PORTABLE CHEST 1 VIEW COMPARISON:  Chest radiograph 02/16/2022 FINDINGS: Median sternotomy wires and mediastinal surgical clips are again seen. The heart is mildly enlarged, unchanged. The upper mediastinal contours are stable. There are small bilateral pleural effusions with adjacent airspace opacities, left worse than right and worsened since 02/16/2022. The upper lobes are well aerated. There is no pneumothorax There is no acute osseous abnormality. IMPRESSION: Small left larger than right pleural effusions with adjacent bibasilar airspace opacities, worsened since 02/16/2022. Electronically Signed   By: Valetta Mole M.D.   On: 03/06/2022 11:24   DG HIP UNILAT WITH PELVIS 1V LEFT  Result Date: 03/06/2022 CLINICAL DATA:  Postreduction left hip.  [id number]. EXAM: DG HIP (WITH OR WITHOUT PELVIS) 1V*L* COMPARISON:  Earlier study today at 1:51 a.m. FINDINGS: Single AP view reveals relocation of the left femoral head prosthesis into the acetabular cup prosthesis. Evidence of fractures is not seen. IMPRESSION: Relocation of the left femoral head  prosthesis into the acetabular cup prosthesis. Electronically Signed   By: Telford Nab M.D.   On: 03/06/2022 04:33   DG Hip Unilat With Pelvis 2-3 Views Left  Result Date: 03/06/2022 CLINICAL DATA:  Left hip pain. EXAM: DG HIP (WITH OR WITHOUT PELVIS) 2-3V LEFT COMPARISON:  None Available. FINDINGS: A total left hip replacement is seen with dorsal dislocation of the left femoral prosthesis. There is no evidence of an acute fracture. Soft tissue structures are unremarkable. IMPRESSION: Total left hip replacement with dorsal dislocation of the left femoral prosthesis. Electronically Signed   By: Virgina Norfolk M.D.   On: 03/06/2022 02:11   DG Chest Portable 1 View  Result Date: 02/16/2022 CLINICAL DATA:  GI bleeding EXAM: PORTABLE CHEST 1 VIEW COMPARISON:  Previous studies including the examination of 02/06/2022 FINDINGS: Transverse diameter of heart is slightly increased. There is previous coronary bypass surgery. There are no signs of pulmonary edema. There is blunting of lateral CP angles. Small linear densities are seen in right lower lung field. There is no pneumothorax. There is previous surgical fusion in cervical spine. IMPRESSION: Minimal bilateral pleural effusions. Linear densities in right lower lung fields suggest subsegmental atelectasis. Electronically Signed   By: Elmer Picker M.D.   On: 02/16/2022 14:12    Assessment/Plan: Acute blood loss anemia (ABLA) take Vit B12, Vit B12>2000, Iron 77  02/14/22, Hgb 9.6 03/09/22>>8.7 03/16/22, empirical Fe supplement 3x/wk, update CBC, Fe, Ferritin, FOBTx3 Hospitalized 02/16/22-02/22/22 rectal bleed, external hemorrhoids, gb 6.1, PRBC x2 units transfused, f/u GI  Slow transit constipation takes Colace, MiraLax  GERD (gastroesophageal reflux disease) takes Pantoprazole, prn Zofran, 03/10/22 X-ray abd normal abd.   Lower GI bleeding Hx of L GI diverticular bleed, on PPI,  BRBPR hemorrhoids, Hydrocortisone cream, avoid  constipation  Rheumatoid arthritis (HCC) takes Golimumab,  Leflunomide, fx of knees, hands, wrists, cervical disc disease/fracture, takes Gabapentin PMR, Hx of   Acquired hypothyroidism  takes Levothyroxine, TSH 2.35 06/21/21  Aortic atherosclerosis ([place]) s/p CABG x1, LIMA LAD for ostial LAD, EF wnl. S/p aneurysm repair  CKD (chronic kidney disease) stage 4, GFR 15-29 ml/min (HCC) Bun/creat 44/2.55 03/09/22,  followed by Nephrology  TIA (transient ischemic attack) TIA/Stroke, on Atorvastatin, Eliquis.   Ankle swelling  BLE, on Furosemide  Vitamin D deficiency deficiency, takes Vit D  Hyperlipidemia takes Atorvastatin, LDL 74 09/07/20  Essential hypertension Blood pressure is controlled,  takes Coreg, Amlodipine  DVT (deep venous thrombosis) ([place]) 01/28/22 [place] age indeterminate deep vein thrombosis  involving the right peroneal veins, on Eliquis  Pulmonary emboli ([place])  Hospitalized 02/06/22-02/11/22 for acute PE R middle lobe,  on Eliquis now, treat with shortest course possible, dc Eliquis if bleeds again  COPD (chronic obstructive pulmonary disease) (HCC) prn Albuterol ACT, Stiolto, Triamcinolone nasal spray, O2 2lpm  Primary osteoarthritis of left hip  01/23/22 s/p total replacement of L hip, f/u Dr Erlinda Hong. Hospitalized 03/06/22-03/09/22, fell, left hip posterior dislocation, reduced in ER 03/06/22, followed by Ortho, left knee immobilizer use, SN[facility] for therapy  Gallstones 03/15/22 [place] small gallstones    Family/ staff Communication: plan of care reviewed with the patient and charge nurse.   Labs/tests ordered: pending CBC, Fe, Ferritin  Time spend 35 mintues.

## 2022-03-17 NOTE — Assessment & Plan Note (Signed)
Hx of L GI diverticular bleed, on PPI,  BRBPR hemorrhoids, Hydrocortisone cream, avoid constipation

## 2022-03-20 DIAGNOSIS — I2510 Atherosclerotic heart disease of native coronary artery without angina pectoris: Secondary | ICD-10-CM | POA: Diagnosis not present

## 2022-03-20 DIAGNOSIS — Z9889 Other specified postprocedural states: Secondary | ICD-10-CM | POA: Diagnosis not present

## 2022-03-20 DIAGNOSIS — D631 Anemia in chronic kidney disease: Secondary | ICD-10-CM | POA: Diagnosis not present

## 2022-03-20 DIAGNOSIS — K922 Gastrointestinal hemorrhage, unspecified: Secondary | ICD-10-CM | POA: Diagnosis not present

## 2022-03-20 DIAGNOSIS — I129 Hypertensive chronic kidney disease with stage 1 through stage 4 chronic kidney disease, or unspecified chronic kidney disease: Secondary | ICD-10-CM | POA: Diagnosis not present

## 2022-03-20 DIAGNOSIS — E875 Hyperkalemia: Secondary | ICD-10-CM | POA: Diagnosis not present

## 2022-03-20 DIAGNOSIS — N184 Chronic kidney disease, stage 4 (severe): Secondary | ICD-10-CM | POA: Diagnosis not present

## 2022-03-20 LAB — CBC AND DIFFERENTIAL
HCT: 30 — AB (ref 36–46)
Hemoglobin: 9.7 — AB (ref 12.0–16.0)
Neutrophils Absolute: 4664
Platelets: 451 10*3/uL — AB (ref 150–400)
WBC: 9.2

## 2022-03-20 LAB — CBC: RBC: 3.21 — AB (ref 3.87–5.11)

## 2022-03-21 ENCOUNTER — Non-Acute Institutional Stay (SKILLED_NURSING_FACILITY): Payer: Medicare PPO | Admitting: Nurse Practitioner

## 2022-03-21 ENCOUNTER — Encounter: Payer: Self-pay | Admitting: Nurse Practitioner

## 2022-03-21 DIAGNOSIS — G459 Transient cerebral ischemic attack, unspecified: Secondary | ICD-10-CM

## 2022-03-21 DIAGNOSIS — M069 Rheumatoid arthritis, unspecified: Secondary | ICD-10-CM

## 2022-03-21 DIAGNOSIS — K922 Gastrointestinal hemorrhage, unspecified: Secondary | ICD-10-CM

## 2022-03-21 DIAGNOSIS — I714 Abdominal aortic aneurysm, without rupture, unspecified: Secondary | ICD-10-CM | POA: Diagnosis not present

## 2022-03-21 DIAGNOSIS — K5901 Slow transit constipation: Secondary | ICD-10-CM

## 2022-03-21 DIAGNOSIS — K219 Gastro-esophageal reflux disease without esophagitis: Secondary | ICD-10-CM

## 2022-03-21 DIAGNOSIS — I824Z1 Acute embolism and thrombosis of unspecified deep veins of right distal lower extremity: Secondary | ICD-10-CM

## 2022-03-21 DIAGNOSIS — E039 Hypothyroidism, unspecified: Secondary | ICD-10-CM

## 2022-03-21 DIAGNOSIS — D62 Acute posthemorrhagic anemia: Secondary | ICD-10-CM | POA: Diagnosis not present

## 2022-03-21 DIAGNOSIS — M1612 Unilateral primary osteoarthritis, left hip: Secondary | ICD-10-CM

## 2022-03-21 DIAGNOSIS — N184 Chronic kidney disease, stage 4 (severe): Secondary | ICD-10-CM | POA: Diagnosis not present

## 2022-03-21 DIAGNOSIS — K625 Hemorrhage of anus and rectum: Secondary | ICD-10-CM

## 2022-03-21 DIAGNOSIS — E782 Mixed hyperlipidemia: Secondary | ICD-10-CM

## 2022-03-21 DIAGNOSIS — I10 Essential (primary) hypertension: Secondary | ICD-10-CM

## 2022-03-21 DIAGNOSIS — J432 Centrilobular emphysema: Secondary | ICD-10-CM

## 2022-03-21 DIAGNOSIS — E559 Vitamin D deficiency, unspecified: Secondary | ICD-10-CM

## 2022-03-21 DIAGNOSIS — M25473 Effusion, unspecified ankle: Secondary | ICD-10-CM

## 2022-03-21 NOTE — Assessment & Plan Note (Signed)
take Vit B12, Fe, Vit B12>2000, Iron 38 03/17/22, Hgb 9.6 03/09/22>>8.7 03/16/22<<9.7 03/20/22, will start EPO per Nephrology.

## 2022-03-21 NOTE — Assessment & Plan Note (Signed)
s/p CABG x1, LIMA LAD for ostial LAD, EF wnl. S/p aneurysm repair

## 2022-03-21 NOTE — Assessment & Plan Note (Signed)
BLE, on Furosemide, off Kcl per nephrology.

## 2022-03-21 NOTE — Assessment & Plan Note (Signed)
Stable, takes Colace, MiraLax

## 2022-03-21 NOTE — Assessment & Plan Note (Signed)
takes Golimumab,  Leflunomide, Gabapentin, Hx of fx of knees, hands, wrists, cervical disc disease/fracture

## 2022-03-21 NOTE — Assessment & Plan Note (Signed)
Bun/creat 44/2.55 03/09/22, will start EPO,  followed by Nephrology

## 2022-03-21 NOTE — Assessment & Plan Note (Signed)
DVT 01/28/22 [place] age indeterminate deep vein thrombosis  involving the right peroneal veins, on Eliquis

## 2022-03-21 NOTE — Assessment & Plan Note (Signed)
01/23/22 s/p total replacement of L hip, f/u Dr Erlinda Hong.  Hospitalized 03/06/22-03/09/22, fell, left hip posterior dislocation, reduced in ER 03/06/22, followed by Ortho, left knee immobilizer use, [facility] for therapy Ambulates with walker with posterior hip precaution, continue therapy.

## 2022-03-21 NOTE — Assessment & Plan Note (Signed)
Hx of L GI diverticular bleed, on PPI

## 2022-03-21 NOTE — Assessment & Plan Note (Signed)
Stable, takes Pantoprazole, prn Zofran, 03/10/22 X-ray abd normal abd.

## 2022-03-21 NOTE — Assessment & Plan Note (Signed)
TIA/Stroke, on Atorvastatin, Eliquis.

## 2022-03-21 NOTE — Assessment & Plan Note (Signed)
takes Atorvastatin, LDL 74 09/07/20

## 2022-03-21 NOTE — Progress Notes (Signed)
Location:   [facility] Room Number: 42-A Place of Service:  SNF (31)  Provider: Marlana Latus NP  PCP: Cassandria Anger, MD Patient Care Team: Cassandria Anger, MD as PCP - General (Internal Medicine) Leonie Hetty Linhart, MD as PCP - Cardiology (Cardiology) Leonie Rylynn Schoneman, MD as Consulting Physician (Cardiology) Gavin Pound, MD as Consulting Physician (Rheumatology) Armbruster, Carlota Raspberry, MD as Consulting Physician (Gastroenterology) Tanda Rockers, MD as Consulting Physician (Pulmonary Disease) Grace Isaac, MD (Inactive) as Consulting Physician (Cardiothoracic Surgery) Aloha Gell, MD as Consulting Physician (Obstetrics and Gynecology) Plotnikov, [name], MD Luretha Rued, RN as [place] Management  Extended Emergency Contact Information Primary Emergency Contact: [name] Address: [address]           [place], [place] [address] [place] of [place] Phone: [phone] Mobile Phone: [phone] Relation: Spouse Secondary Emergency Contact: [name],[name] Address: [address] [place] [address] [place] Phone: [phone] Relation: Friend  Code Status: DNR Goals of care:  Advanced Directive information    03/21/2022    3:43 PM  Advanced Directives  Does Patient Have a Medical Advance Directive? No  Would patient like information on creating a medical advance directive? No - Patient declined     Allergies  Allergen Reactions   Nsaids Other (See Comments)    Stomach upset Told to avoid due to Eliquis   Aspirin Other (See Comments)    Stomach upset Told to avoid due to Eliquis   Codeine Nausea And Vomiting   Nitrostat [Nitroglycerin] Other (See Comments)    Bradycardia. Drop in heart rate     Chief Complaint  Patient presents with   Discharge Note    HPI:  81 y.o. female with medical history significant of DVT, PE, OA, IDA, COPD, HTN, HLD,  CKD, RA, Hypothyroidism, CAD was admitted to SNF [facility] following multiple hospitalization for therapy.    The patient has regained physical strength, ADL function, hemodynamically stable to return to [place] [facility] to continue therapy.               Hospitalized 03/06/22-03/09/22, fell, left hip posterior dislocation, reduced in ER 03/06/22, followed by Ortho, left knee immobilizer use, SNF [facility] for therapy             Hospitalized 02/16/22-02/22/22 rectal bleed, external hemorrhoids, gb 6.1, PRBC x2 units transfused, f/u GI              Hospitalized 02/06/22-02/11/22 for acute PE R middle lobe,  on Eliquis now, treat with shortest course possible, dc Eliquis if bleeds again             01/23/22 s/p total replacement of L hip, f/u Dr Erlinda Hong.             DOE/COPD, prn Albuterol ACT, Stiolto, Triamcinolone nasal spray, O2 2lpm  BRBPR hemorrhoids, Hydrocortisone cream, avoid constipation             DVT 01/28/22 [place] age indeterminate deep vein thrombosis  involving the right peroneal veins, on Eliquis             HTN, takes Coreg, off Amlodipine by nephrology.              HLD takes Atorvastatin, LDL 74 09/07/20             Vit D deficiency, takes Vit D  Anemia, take Vit B12, Fe, Vit B12>2000, Iron 38 03/17/22, Hgb 9.6 03/09/22>>8.7 03/16/22<<9.7 03/20/22, will start EPO per Nephrology.              Constipation, takes Colace, MiraLax             GERD, takes Pantoprazole, prn Zofran, 03/10/22 X-ray abd normal abd.              Hx of L GI diverticular bleed, on PPI             RA, takes Golimumab,  Leflunomide, Gabapentin, Hx of fx of knees, hands, wrists, cervical disc disease/fracture             Hypothyroidism, takes Levothyroxine, TSH 2.35 06/21/21             CAD s/p CABG x1, LIMA LAD for ostial LAD, EF wnl. S/p aneurysm repair             CKD, Bun/creat 44/2.55 03/09/22, will start EPO,  followed by Nephrology             TIA/Stroke, on Atorvastatin, Eliquis.              PMR, Hx of              Edema,  BLE, on Furosemide, off Kcl per nephrology.   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 Most likely scar. Mild subendocardial ischemia. EF 71% LOW RISK.    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 Charlestine Night   Rheumatoid arthritis [facility]) 2011   Dr.Truslow; fracture knees, hands and wrists -    S/P CABG x 1 1996   CABG--LIMA-LAD for ostial LAD (not felt to be PCI amenable). EF NORMAL then; LIMA now atretic   Shortness of breath dyspnea    with exertion   Stroke ([place]) 11/2014   TIA    Urinary frequency     Past Surgical History:  Procedure Laterality Date   ABDOMINAL AORTIC ANEURYSM REPAIR  5465   Complicated by mesenteric artery stenosis and splenic artery infarction with acquired Asplenia   APPENDECTOMY     BUNIONECTOMY  07/2011   right foot   CARDIAC CATHETERIZATION  2005   (Most recent CATH) - ostial LAD lesion 20-30% (down from 90% initially). Atretic LIMA. Minimal disease the RCA and Circumflex system.  CARPAL TUNNEL  RELEASE Left    CATARACT EXTRACTION Bilateral    CERVICAL SPINE SURGERY     plate 2008 Dr. Saintclair Halsted   COLONOSCOPY     CORONARY ARTERY BYPASS GRAFT  1996   INCLUDED AN INTERNAL MAMMARY ARTERY TO THE LAD. EF WAS NORMAL   INGUINAL HERNIA REPAIR Right    LAPAROSCOPIC APPENDECTOMY N/A 06/28/2016   Procedure: APPENDECTOMY LAPAROSCOPIC;  Surgeon: Leighton Ruff, MD;  Location: [facility];  Service: General;  Laterality: N/A;   NM MYOVIEW LTD  06/2010   Fixed anteroseptal, apical and inferoapical defect with moderate size. Most likely scar. Mild subendocardial ischemia. EF 71% LOW RISK.    SPLENECTOMY     TOTAL HIP ARTHROPLASTY Left 01/23/2022   Procedure: LEFT TOTAL HIP ARTHROPLASTY ANTERIOR APPROACH;  Surgeon: Leandrew Koyanagi, MD;  Location: [place];  Service: Orthopedics;  Laterality: Left;  3-C   TRANSTHORACIC ECHOCARDIOGRAM  12/2014   [facility]: Normal LV size & function. EF 55-60%,    vagina polyp     VIDEO ASSISTED THORACOSCOPY (VATS)/WEDGE RESECTION Left 03/02/2015   Procedure: VIDEO ASSISTED THORACOSCOPY (VATS), MINI THORACOTOMY, LEFT UPPER LOBE WEDGE, TAKE DOWN OF INTERNAL MAMMARY LESIONS, PLACEMENT OF ON-Q PUMP;  Surgeon: Grace Isaac, MD;  Location: [place];  Service: Thoracic;  Laterality: Left;   VIDEO BRONCHOSCOPY N/A 03/02/2015   Procedure: BRONCHOSCOPY;  Surgeon: Grace Isaac, MD;  Location: [place];  Service: Thoracic;  Laterality: N/A;   VIDEO BRONCHOSCOPY WITH ENDOBRONCHIAL NAVIGATION N/A 10/08/2017   Procedure: VIDEO BRONCHOSCOPY WITH ENDOBRONCHIAL NAVIGATION WITH BIOPSIES OF LEFT UPPER LOBE AND LEFT LOWER LOBE;  Surgeon: Grace Isaac, MD;  Location: [facility];  Service: Thoracic;  Laterality: N/A;      reports that she quit smoking about 63 years ago. Her smoking use included cigarettes. She has never been exposed to tobacco smoke. She has never used smokeless tobacco. She reports current alcohol use. She reports that she does not use drugs. Social History    Socioeconomic History   Marital status: Married    Spouse name: Not on file   Number of children: 4   Years of education: Not on file   Highest education level: Not on file  Occupational History   Occupation: retired    Fish farm manager: RETIRED  Tobacco Use   Smoking status: Former    Types: Cigarettes    Quit date: 12/04/1958    Years since quitting: 63.3    Passive exposure: Never   Smokeless tobacco: Never  Vaping Use   Vaping Use: Never used  Substance and Sexual Activity   Alcohol use: Yes    Comment: hardly ever   Drug use: No   Sexual activity: Not Currently  Other Topics Concern   Not on file  Social History Narrative   [facility]  [place] ; 4 [place].    Living at [place] since dec 2015   Social Determinants of Health   Financial Resource Strain: Low Risk  (05/30/2021)   Overall Financial Resource Strain (CARDIA)    Difficulty of Paying Living Expenses: Not hard at all  Food Insecurity: No Food Insecurity (01/23/2022)   Hunger Vital Sign    Worried About Running Out of Food in the Last Year: Never true    Ran Out of Food in the Last Year: Never true  Transportation Needs: No Transportation Needs (01/23/2022)   PRAPARE - Hydrologist (Medical): No  Lack of Transportation (Non-Medical): No  Physical Activity: Insufficiently Active (05/30/2021)   Exercise Vital Sign    Days of Exercise per Week: 3 days    Minutes of Exercise per Session: 30 min  Stress: No Stress Concern Present (05/30/2021)   [place]    Feeling of Stress : Not at all  Social Connections: Moderately Integrated (05/30/2021)   Social Connection and Isolation Panel [NHANES]    Frequency of Communication with Friends and Family: Twice a week    Frequency of Social Gatherings with Friends and Family: Twice a week    Attends Religious Services: Never     Marine scientist or Organizations: Yes    Attends Music therapist: More than 4 times per year    Marital Status: Married  Human resources officer Violence: Not At Risk (01/23/2022)   Humiliation, Afraid, Rape, and Kick questionnaire    Fear of Current or Ex-Partner: No    Emotionally Abused: No    Physically Abused: No    Sexually Abused: No   Functional Status Survey:    Allergies  Allergen Reactions   Nsaids Other (See Comments)    Stomach upset Told to avoid due to Eliquis   Aspirin Other (See Comments)    Stomach upset Told to avoid due to Eliquis   Codeine Nausea And Vomiting   Nitrostat [Nitroglycerin] Other (See Comments)    Bradycardia. Drop in heart rate     Pertinent  Health Maintenance Due  Topic Date Due   COLONOSCOPY (Pts 45-40yr Insurance coverage will need to be confirmed)  03/30/2019   INFLUENZA VACCINE  Completed   DEXA SCAN  Completed    Medications: Allergies as of 03/21/2022       Reactions   Nsaids Other (See Comments)   Stomach upset Told to avoid due to Eliquis   Aspirin Other (See Comments)   Stomach upset Told to avoid due to Eliquis   Codeine Nausea And Vomiting   Nitrostat [nitroglycerin] Other (See Comments)   Bradycardia. Drop in heart rate         Medication List        Accurate as of March 21, 2022 11:59 PM. If you have any questions, ask your nurse or doctor.          STOP taking these medications    amLODipine 5 MG tablet Commonly known as: NORVASC Stopped by: Lennin Osmond X Joanthony Hamza, NP   potassium chloride 10 MEQ tablet Commonly known as: KLOR-CON M Stopped by: Latessa Tillis X Kiandre Spagnolo, NP       TAKE these medications    acetaminophen 325 MG tablet Commonly known as: TYLENOL Take 650 mg by mouth 2 (two) times daily as needed (generalized pain).   albuterol 108 (90 Base) MCG/ACT inhaler Commonly known as: VENTOLIN HFA Inhale 1-2 puffs into the lungs every 4 (four) hours as needed for wheezing or shortness of  breath.   apixaban 2.5 MG Tabs tablet Commonly known as: ELIQUIS Take 1 tablet (2.5 mg total) by mouth 2 (two) times daily.   ascorbic acid 500 MG tablet Commonly known as: VITAMIN C Take 500 mg by mouth daily.   atorvastatin 80 MG tablet Commonly known as: LIPITOR Take 80 mg by mouth daily.   carvedilol 6.25 MG tablet Commonly known as: COREG Take 1 tablet (6.25 mg total) by mouth 2 (two) times daily with a meal.   CENTRUM ADULT PO Take 1 tablet by mouth daily.  1 Tablet Daily.   Cyanocobalamin 2500 MCG Tabs Take 2,500 mcg by mouth daily.   docusate sodium 100 MG capsule Commonly known as: Colace Take 1-2 capsules (100-200 mg total) by mouth daily as needed for mild constipation.   furosemide 40 MG tablet Commonly known as: LASIX Take 1 tablet (40 mg total) by mouth daily.   gabapentin 300 MG capsule Commonly known as: NEURONTIN Take 1 capsule (300 mg total) by mouth at bedtime.   Golimumab 50 MG/0.5ML Soaj Take 50 mg by mouth every 2 (two) months. Hold for the next 2 weeks per Orthopedic surgery recommendations.   leflunomide 20 MG tablet Commonly known as: ARAVA Take 20 mg by mouth daily.   levothyroxine 88 MCG tablet Commonly known as: SYNTHROID TAKE 1 TABLET (88 MCG TOTAL) BY MOUTH DAILY BEFORE BREAKFAST.   pantoprazole 40 MG tablet Commonly known as: PROTONIX Take 1 tablet (40 mg total) by mouth 2 (two) times daily.   polyethylene glycol 17 g packet Commonly known as: MIRALAX / GLYCOLAX Take 17 g by mouth daily as needed.   Stiolto Respimat 2.5-2.5 MCG/ACT Aers Generic drug: Tiotropium Bromide-Olodaterol Inhale 2 each into the lungs at bedtime.   triamcinolone 55 MCG/ACT Aero nasal inhaler Commonly known as: NASACORT Place 1 spray into the nose daily.   Vitamin D3 50 MCG (2000 UT) capsule Take 1 capsule (2,000 Units total) by mouth daily.        Review of Systems  Constitutional:  Negative for activity change, appetite change and fever.   HENT:  Negative for congestion and trouble swallowing.   Eyes:  Negative for visual disturbance.  Respiratory:  Positive for shortness of breath. Negative for cough and wheezing.        DOE  Cardiovascular:  Positive for leg swelling.  Gastrointestinal:  Negative for abdominal pain, blood in stool, constipation, nausea and vomiting.  Genitourinary:  Negative for difficulty urinating, dysuria and urgency.  Musculoskeletal:  Positive for arthralgias, back pain, gait problem and joint swelling.  Skin:  Negative for color change.  Neurological:  Negative for tremors and headaches.  Psychiatric/Behavioral:  Negative for behavioral problems and sleep disturbance. The patient is not nervous/anxious.     Vitals:   03/21/22 1504  BP: 130/62  Pulse: 100  Resp: 18  Temp: 97.6 F (36.4 C)  SpO2: 96%  Weight: 154 lb 14.4 oz (70.3 kg)  Height: '5\' 3"'$  (1.6 m)   Body mass index is 27.44 kg/m. Physical Exam Constitutional:      Comments: Exhausted easily.   HENT:     Head: Normocephalic and atraumatic.     Nose: Nose normal.     Mouth/Throat:     Mouth: Mucous membranes are moist.  Eyes:     Extraocular Movements: Extraocular movements intact.     Conjunctiva/sclera: Conjunctivae normal.     Pupils: Pupils are equal, round, and reactive to light.  Cardiovascular:     Rate and Rhythm: Normal rate and regular rhythm.     Heart sounds: No murmur heard. Pulmonary:     Effort: Pulmonary effort is normal.     Breath sounds: No wheezing, rhonchi or rales.  Abdominal:     General: Bowel sounds are normal.     Palpations: Abdomen is soft.     Tenderness: There is no abdominal tenderness.  Genitourinary:    Comments: External hemorrhoids from previous examination.  Musculoskeletal:     Cervical back: Normal range of motion and neck supple.  Right lower leg: Edema present.     Left lower leg: Edema present.     Comments: Moderate edema BLE L>R  Skin:    General: Skin is warm and dry.      Comments: L hip surgical scar  Neurological:     General: No focal deficit present.     Mental Status: She is alert and oriented to person, place, and time. Mental status is at baseline.     Motor: No weakness.     Gait: Gait abnormal.  Psychiatric:        Mood and Affect: Mood normal.        Behavior: Behavior normal.        Thought Content: Thought content normal.     Labs reviewed: Basic Metabolic Panel: Recent Labs    03/07/22 0310 03/08/22 0650 03/09/22 0540  NA 141 140 138  K 3.8 3.6 3.4*  CL 101 100 100  CO2 '26 28 27  '$ GLUCOSE 116* 105* 109*  BUN 37* 40* 44*  CREATININE 2.23* 2.44* 2.55*  CALCIUM 9.0 9.4 9.5  MG 2.3 1.9 1.8   Liver Function Tests: Recent Labs    02/06/22 1529 02/07/22 0315 02/14/22 0000 02/16/22 1205  AST 63* 48* 28 25  ALT 36 '29 18 19  '$ ALKPHOS 123 110 108 74  BILITOT 0.5 0.7  --  0.3  PROT 6.5 5.2*  --  4.8*  ALBUMIN 2.4* 2.1* 3.1* 2.3*   No results for input(s): "LIPASE", "AMYLASE" in the last 8760 hours. No results for input(s): "AMMONIA" in the last 8760 hours. CBC: Recent Labs    03/07/22 0310 03/08/22 0650 03/09/22 0540 03/16/22 0000 03/17/22 0000 03/20/22 0000  WBC 7.9 9.8 9.9 10.3 10.3 9.2  NEUTROABS 4.2 4.9 5.5  --   --  4,664.00  HGB 10.0* 9.6* 9.6* 8.7* 8.9* 9.7*  HCT 30.7* 28.4* 28.5* 26* 27* 30*  MCV 92.2 88.2 90.2  --   --   --   PLT 391 417* 421* 424* 465* 451*   Cardiac Enzymes: No results for input(s): "CKTOTAL", "CKMB", "CKMBINDEX", "TROPONINI" in the last 8760 hours. BNP: Invalid input(s): "POCBNP" CBG: Recent Labs    03/08/22 1108  GLUCAP 113*    Procedures and Imaging Studies During Stay: DG Ankle Complete Left  Result Date: 03/07/2022 CLINICAL DATA:  Left ankle pain.  Hip dislocation. EXAM: LEFT ANKLE COMPLETE - 3+ VIEW COMPARISON:  None Available. FINDINGS: There is no evidence of fracture, dislocation, or joint effusion. There is no evidence of arthropathy or other focal bone abnormality.  Soft tissues are unremarkable. Partially imaged splint and bandage over the distal leg/ankle. IMPRESSION: No acute fracture or dislocation. Electronically Signed   By: Keane Police D.O.   On: 03/07/2022 10:37   DG Pelvis Portable  Result Date: 03/06/2022 CLINICAL DATA:  Reduction of dislocation EXAM: PORTABLE PELVIS 1-2 VIEWS COMPARISON:  01/23/2022 FINDINGS: No recent fracture or dislocation is seen. There is previous left hip arthroplasty. There is interval clearing of pockets of air from the soft tissues since 01/23/2022. SI joints are symmetrical. Degenerative changes are noted in visualized lower lumbar spine. IMPRESSION: Previous left hip arthroplasty. No recent fracture or dislocation is seen. Lumbar spondylosis. Electronically Signed   By: Elmer Picker M.D.   On: 03/06/2022 14:57   DG Chest Port 1 View  Result Date: 03/06/2022 CLINICAL DATA:  Recent hip fracture complicated by DVT and PE, history of COPD. EXAM: PORTABLE CHEST 1 VIEW COMPARISON:  Chest radiograph 02/16/2022  FINDINGS: Median sternotomy wires and mediastinal surgical clips are again seen. The heart is mildly enlarged, unchanged. The upper mediastinal contours are stable. There are small bilateral pleural effusions with adjacent airspace opacities, left worse than right and worsened since 02/16/2022. The upper lobes are well aerated. There is no pneumothorax There is no acute osseous abnormality. IMPRESSION: Small left larger than right pleural effusions with adjacent bibasilar airspace opacities, worsened since 02/16/2022. Electronically Signed   By: Valetta Mole M.D.   On: 03/06/2022 11:24   DG HIP UNILAT WITH PELVIS 1V LEFT  Result Date: 03/06/2022 CLINICAL DATA:  Postreduction left hip.  [id number]. EXAM: DG HIP (WITH OR WITHOUT PELVIS) 1V*L* COMPARISON:  Earlier study today at 1:51 a.m. FINDINGS: Single AP view reveals relocation of the left femoral head prosthesis into the acetabular cup prosthesis. Evidence of fractures is  not seen. IMPRESSION: Relocation of the left femoral head prosthesis into the acetabular cup prosthesis. Electronically Signed   By: Telford Nab M.D.   On: 03/06/2022 04:33   DG Hip Unilat With Pelvis 2-3 Views Left  Result Date: 03/06/2022 CLINICAL DATA:  Left hip pain. EXAM: DG HIP (WITH OR WITHOUT PELVIS) 2-3V LEFT COMPARISON:  None Available. FINDINGS: A total left hip replacement is seen with dorsal dislocation of the left femoral prosthesis. There is no evidence of an acute fracture. Soft tissue structures are unremarkable. IMPRESSION: Total left hip replacement with dorsal dislocation of the left femoral prosthesis. Electronically Signed   By: Virgina Norfolk M.D.   On: 03/06/2022 02:11    Assessment/Plan:   Acute blood loss anemia (ABLA)  take Vit B12, Fe, Vit B12>2000, Iron 38 03/17/22, Hgb 9.6 03/09/22>>8.7 03/16/22<<9.7 03/20/22, will start EPO per Nephrology.   Slow transit constipation Stable, takes Colace, MiraLax  GERD (gastroesophageal reflux disease) Stable, takes Pantoprazole, prn Zofran, 03/10/22 X-ray abd normal abd.   Lower GI bleeding  Hx of L GI diverticular bleed, on PPI  Rheumatoid arthritis (HCC)  takes Golimumab,  Leflunomide, Gabapentin, Hx of fx of knees, hands, wrists, cervical disc disease/fracture  Acquired hypothyroidism takes Levothyroxine, TSH 2.35 06/21/21  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) s/p CABG x1, LIMA LAD for ostial LAD, EF wnl. S/p aneurysm repair  CKD (chronic kidney disease) stage 4, GFR 15-29 ml/min (HCC) Bun/creat 44/2.55 03/09/22, will start EPO,  followed by Nephrology  TIA (transient ischemic attack) TIA/Stroke, on Atorvastatin, Eliquis.   Ankle swelling BLE, on Furosemide, off Kcl per nephrology.    Vitamin D deficiency takes Vit D  Hyperlipidemia  takes Atorvastatin, LDL 74 09/07/20  Essential hypertension Blood pressure is controlled, takes Coreg, off Amlodipine by nephrology.    DVT (deep venous thrombosis) ([place]) DVT 01/28/22 [place] age indeterminate deep vein thrombosis  involving the right peroneal veins, on Eliquis  BRBPR (bright red blood per rectum)  BRBPR hemorrhoids, Hydrocortisone cream, avoid constipation. Hospitalized 02/16/22-02/22/22 rectal bleed, external hemorrhoids, gb 6.1, PRBC x2 units transfused, f/u GI  COPD (chronic obstructive pulmonary disease) (HCC) DOE/COPD, prn Albuterol ACT, Stiolto, Triamcinolone nasal spray, O2 2lpm  Primary osteoarthritis of left hip 01/23/22 s/p total replacement of L hip, f/u Dr Erlinda Hong.  Hospitalized 03/06/22-03/09/22, fell, left hip posterior dislocation, reduced in ER 03/06/22, followed by Ortho, left knee immobilizer use, SNF [facility] for therapy Ambulates with walker with posterior hip precaution, continue therapy.    Patient is being discharged with the following home health services:    Patient is being discharged with the following durable medical equipment:  Patient has been advised to f/u with their PCP in 1-2 weeks to bring them up to date on their rehab stay.  Social services at facility was responsible for arranging this appointment.  Pt was provided with a 30 day supply of prescriptions for medications and refills must be obtained from their PCP.  For controlled substances, a more limited supply may be provided adequate until PCP appointment only.  Future labs/tests needed:  prn

## 2022-03-21 NOTE — Assessment & Plan Note (Signed)
DOE/COPD, prn Albuterol ACT, Stiolto, Triamcinolone nasal spray, O2 2lpm

## 2022-03-21 NOTE — Assessment & Plan Note (Signed)
takes Levothyroxine, TSH 2.35 06/21/21

## 2022-03-21 NOTE — Assessment & Plan Note (Addendum)
BRBPR hemorrhoids, Hydrocortisone cream, avoid constipation. Hospitalized 02/16/22-02/22/22 rectal bleed, external hemorrhoids, gb 6.1, PRBC x2 units transfused, f/u GI

## 2022-03-21 NOTE — Progress Notes (Signed)
  Care Coordination Note  03/21/2022 Name: [name] MRN: [id number] DOB: [birth year]  [name] is a 81 y.o. year old female who is a primary care patient of Plotnikov, [name], MD and is actively engaged with the care management team. I reached out to Barbaraann Boys by phone today to assist with re-scheduling an initial visit with the RN Case Manager  Follow up plan: We have been unable to make contact with the patient for follow up.    Julian Hy, [facility] Direct Dial: [phone]

## 2022-03-21 NOTE — Assessment & Plan Note (Signed)
Blood pressure is controlled, takes Coreg, off Amlodipine by nephrology.

## 2022-03-21 NOTE — Assessment & Plan Note (Signed)
takes Vit D

## 2022-03-27 ENCOUNTER — Other Ambulatory Visit: Payer: Self-pay | Admitting: Nurse Practitioner

## 2022-03-27 ENCOUNTER — Encounter: Payer: Self-pay | Admitting: Nurse Practitioner

## 2022-03-27 DIAGNOSIS — M6281 Muscle weakness (generalized): Secondary | ICD-10-CM | POA: Diagnosis not present

## 2022-03-27 DIAGNOSIS — S73005D Unspecified dislocation of left hip, subsequent encounter: Secondary | ICD-10-CM | POA: Diagnosis not present

## 2022-03-27 DIAGNOSIS — R2689 Other abnormalities of gait and mobility: Secondary | ICD-10-CM | POA: Diagnosis not present

## 2022-03-27 DIAGNOSIS — R29898 Other symptoms and signs involving the musculoskeletal system: Secondary | ICD-10-CM | POA: Diagnosis not present

## 2022-03-27 DIAGNOSIS — N184 Chronic kidney disease, stage 4 (severe): Secondary | ICD-10-CM | POA: Diagnosis not present

## 2022-03-27 DIAGNOSIS — R2681 Unsteadiness on feet: Secondary | ICD-10-CM | POA: Diagnosis not present

## 2022-03-27 MED ORDER — VITAMIN C 500 MG PO TABS: 500.0000 mg | ORAL_TABLET | Freq: Every day | ORAL | 2 refills | Status: DC

## 2022-03-27 MED ORDER — DOCUSATE SODIUM 100 MG PO CAPS: 100.0000 mg | ORAL_CAPSULE | Freq: Every day | ORAL | 3 refills | Status: DC | PRN

## 2022-03-27 MED ORDER — ACETAMINOPHEN 325 MG PO TABS: 650.0000 mg | ORAL_TABLET | Freq: Two times a day (BID) | ORAL | 2 refills | Status: DC | PRN

## 2022-03-27 MED ORDER — CARVEDILOL 6.25 MG PO TABS: 6.2500 mg | ORAL_TABLET | Freq: Two times a day (BID) | ORAL | Status: DC

## 2022-03-27 MED ORDER — GOLIMUMAB 50 MG/0.5ML ~~LOC~~ SOAJ: 50.0000 mg | SUBCUTANEOUS | Status: DC

## 2022-03-27 MED ORDER — ATORVASTATIN CALCIUM 80 MG PO TABS: 80.0000 mg | ORAL_TABLET | Freq: Every day | ORAL | 2 refills | Status: DC

## 2022-03-27 MED ORDER — PANTOPRAZOLE SODIUM 40 MG PO TBEC: 40.0000 mg | DELAYED_RELEASE_TABLET | Freq: Two times a day (BID) | ORAL | Status: DC

## 2022-03-27 MED ORDER — POLYETHYLENE GLYCOL 3350 17 G PO PACK: 17.0000 g | PACK | Freq: Every day | ORAL | 0 refills | Status: DC | PRN

## 2022-03-27 MED ORDER — ALBUTEROL SULFATE HFA 108 (90 BASE) MCG/ACT IN AERS: 1.0000 | INHALATION_SPRAY | RESPIRATORY_TRACT | 2 refills | Status: DC | PRN

## 2022-03-27 MED ORDER — GABAPENTIN 300 MG PO CAPS: 300.0000 mg | ORAL_CAPSULE | Freq: Every day | ORAL | Status: DC

## 2022-03-27 MED ORDER — LEFLUNOMIDE 20 MG PO TABS: 20.0000 mg | ORAL_TABLET | Freq: Every day | ORAL | 0 refills | Status: DC

## 2022-03-27 MED ORDER — TRIAMCINOLONE ACETONIDE 55 MCG/ACT NA AERO: 1.0000 | INHALATION_SPRAY | Freq: Every day | NASAL | 3 refills | Status: DC

## 2022-03-27 MED ORDER — STIOLTO RESPIMAT 2.5-2.5 MCG/ACT IN AERS: 2.0000 | INHALATION_SPRAY | Freq: Every day | RESPIRATORY_TRACT | 2 refills | Status: DC

## 2022-03-27 MED ORDER — FUROSEMIDE 40 MG PO TABS: 40.0000 mg | ORAL_TABLET | Freq: Every day | ORAL | Status: DC

## 2022-03-27 MED ORDER — VITAMIN D3 50 MCG (2000 UT) PO CAPS: 2000.0000 [IU] | ORAL_CAPSULE | Freq: Every day | ORAL | 3 refills | Status: DC

## 2022-03-27 MED ORDER — LEVOTHYROXINE SODIUM 88 MCG PO TABS: 88.0000 ug | ORAL_TABLET | Freq: Every day | ORAL | 3 refills | Status: DC

## 2022-03-27 MED ORDER — CYANOCOBALAMIN 2500 MCG PO TABS: 2500.0000 ug | ORAL_TABLET | Freq: Every day | ORAL | 2 refills | Status: DC

## 2022-03-28 DIAGNOSIS — R41841 Cognitive communication deficit: Secondary | ICD-10-CM | POA: Diagnosis not present

## 2022-03-28 DIAGNOSIS — S73005D Unspecified dislocation of left hip, subsequent encounter: Secondary | ICD-10-CM | POA: Diagnosis not present

## 2022-03-29 DIAGNOSIS — R2689 Other abnormalities of gait and mobility: Secondary | ICD-10-CM | POA: Diagnosis not present

## 2022-03-29 DIAGNOSIS — M6281 Muscle weakness (generalized): Secondary | ICD-10-CM | POA: Diagnosis not present

## 2022-03-29 DIAGNOSIS — S73005D Unspecified dislocation of left hip, subsequent encounter: Secondary | ICD-10-CM | POA: Diagnosis not present

## 2022-03-29 DIAGNOSIS — R29898 Other symptoms and signs involving the musculoskeletal system: Secondary | ICD-10-CM | POA: Diagnosis not present

## 2022-03-29 DIAGNOSIS — R2681 Unsteadiness on feet: Secondary | ICD-10-CM | POA: Diagnosis not present

## 2022-03-29 DIAGNOSIS — N184 Chronic kidney disease, stage 4 (severe): Secondary | ICD-10-CM | POA: Diagnosis not present

## 2022-03-30 ENCOUNTER — Encounter: Payer: Self-pay | Admitting: Internal Medicine

## 2022-03-30 ENCOUNTER — Ambulatory Visit: Payer: Medicare PPO | Admitting: Internal Medicine

## 2022-03-30 VITALS — BP 136/76 | HR 77 | Temp 98.2°F | Ht 63.0 in | Wt 154.0 lb

## 2022-03-30 DIAGNOSIS — E559 Vitamin D deficiency, unspecified: Secondary | ICD-10-CM

## 2022-03-30 DIAGNOSIS — E039 Hypothyroidism, unspecified: Secondary | ICD-10-CM

## 2022-03-30 DIAGNOSIS — K922 Gastrointestinal hemorrhage, unspecified: Secondary | ICD-10-CM | POA: Diagnosis not present

## 2022-03-30 DIAGNOSIS — R0609 Other forms of dyspnea: Secondary | ICD-10-CM | POA: Diagnosis not present

## 2022-03-30 DIAGNOSIS — N184 Chronic kidney disease, stage 4 (severe): Secondary | ICD-10-CM | POA: Diagnosis not present

## 2022-03-30 MED ORDER — CARVEDILOL 6.25 MG PO TABS: 6.2500 mg | ORAL_TABLET | Freq: Two times a day (BID) | ORAL | 3 refills | Status: DC

## 2022-03-30 NOTE — Assessment & Plan Note (Signed)
Cont on Amlodipine, Metoprolol, Lipitor Considering home HD Monitor GFR

## 2022-03-30 NOTE — Assessment & Plan Note (Signed)
On Vit D 

## 2022-03-30 NOTE — Assessment & Plan Note (Signed)
On Torsemide

## 2022-03-30 NOTE — Progress Notes (Signed)
Subjective:  Patient ID: [name], female    DOB: [birth year]  Age: 81 y.o. MRN: [id number]  CC: Follow-up (Just got out of rehab)   HPI JOHNANNA BAKKE presents for CRF, anemia, DOE  Per Hx:  "81 y.o. 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  Hospitalist team was requested to admit the patient.                                                                   Hospital Course      Left hip posterior dislocation.  Recent history of left hip arthroplasty, left hip was reduced by the ER physician in the ER on 03/06/2022, and by orthopedics, continue supportive care, continue left knee immobilizer and posterior hip precautions at SNF, PT OT, will require SNF.  Medically stable for discharge.   AKI on CKD 4 with mild metabolic acidosis.  Massive fluid overload baseline creatinine of around 2.2, massive fluid overload, renal failure stabilized and improving with diuresis continue gentle Lasix and potassium supplementation upon discharge, check BMP in 5 to 7 days at [facility] post discharge.      Hypertension.  In poor control.  Placed on combination of Coreg and hydralazine monitor and adjust.  Discontinue Norvasc due to massive lower extremity edema.   Anemia of chronic disease.  No acute issues.   Recent history of LGI  diverticular bleed.  On PPI.  Avoid constipation.   Recent history of DVT.  Resumed home dose Eliquis.   COPD.  At baseline no wheezing or shortness of breath, supportive care.   Dyslipidemia.  On home dose statin.   Hypothyroidism.  Home dose Synthroid.   Hypomagnesemia and hypokalemia.  Replaced.        Discharge diagnosis       Principal Problem:   Acute renal failure superimposed on stage 4 chronic kidney disease (HCC) Active Problems:   Essential hypertension   Rheumatoid arthritis (HCC)   Acquired hypothyroidism   Hyperlipidemia   COPD (chronic obstructive pulmonary disease) (HCC)   High anion gap metabolic acidosis   Hip dislocation, left (HCC)   History of anemia due to chronic kidney disease   History of DVT (deep vein thrombosis)   Allergic rhinitis   AKI (acute kidney injury) ([place])"        Outpatient Medications Prior to Visit  Medication Sig Dispense Refill   acetaminophen (TYLENOL) 325 MG tablet Take 2 tablets (650 mg total) by mouth 2 (two) times daily as needed (generalized pain). 30 tablet 2   albuterol (VENTOLIN HFA) 108 (90 Base) MCG/ACT inhaler Inhale 1-2 puffs into the lungs every 4 (four) hours as needed for  wheezing or shortness of breath. 1 each 2   apixaban (ELIQUIS) 2.5 MG TABS tablet Take 1 tablet (2.5 mg total) by mouth 2 (two) times daily. 60 tablet 5   ascorbic acid (VITAMIN C) 500 MG tablet Take 1 tablet (500 mg total) by mouth daily. 30 tablet 2   atorvastatin (LIPITOR) 80 MG tablet Take 1 tablet (80 mg total) by mouth daily. 30 tablet 2   Cholecalciferol (VITAMIN D3) 50 MCG (2000 UT) capsule Take 1 capsule (2,000 Units total) by mouth daily. 100 capsule 3   Cyanocobalamin 2500 MCG TABS Take 2,500 mcg by mouth daily. 30 tablet 2   docusate sodium (COLACE) 100 MG capsule Take 1-2 capsules (100-200 mg total) by mouth daily as needed for mild constipation. 100 capsule 3   gabapentin (NEURONTIN) 300 MG capsule Take 1 capsule (300 mg total) by  mouth at bedtime.     Golimumab 50 MG/0.5ML SOAJ Take 50 mg by mouth every 2 (two) months. Hold for the next 2 weeks per Orthopedic surgery recommendations. 0.3 mL    leflunomide (ARAVA) 20 MG tablet Take 1 tablet (20 mg total) by mouth daily. 30 tablet 0   levothyroxine (SYNTHROID) 88 MCG tablet Take 1 tablet (88 mcg total) by mouth daily before breakfast. 90 tablet 3   Multiple Vitamins-Minerals (CENTRUM ADULT PO) Take 1 tablet by mouth daily. 1 Tablet Daily.     pantoprazole (PROTONIX) 40 MG tablet Take 1 tablet (40 mg total) by mouth 2 (two) times daily.     polyethylene glycol (MIRALAX / GLYCOLAX) 17 g packet Take 17 g by mouth daily as needed. 14 each 0   Tiotropium Bromide-Olodaterol (STIOLTO RESPIMAT) 2.5-2.5 MCG/ACT AERS Inhale 2 each into the lungs at bedtime. 30 each 2   torsemide (DEMADEX) 20 MG tablet Take 20 mg by mouth 2 (two) times daily.     triamcinolone (NASACORT) 55 MCG/ACT AERO nasal inhaler Place 1 spray into the nose daily. 1 each 3   carvedilol (COREG) 6.25 MG tablet Take 1 tablet (6.25 mg total) by mouth 2 (two) times daily with a meal.     furosemide (LASIX) 40 MG tablet Take 1 tablet (40 mg total) by mouth daily. 30 tablet    metoprolol tartrate (LOPRESSOR) 50 MG tablet Take 75 mg by mouth 2 (two) times daily.     No facility-administered medications prior to visit.    ROS: Review of Systems  Constitutional:  Positive for fatigue. Negative for activity change, appetite change, chills and unexpected weight change.  HENT:  Negative for congestion, mouth sores and sinus pressure.   Eyes:  Negative for visual disturbance.  Respiratory:  Positive for shortness of breath. Negative for cough and chest tightness.   Cardiovascular:  Positive for leg swelling.  Gastrointestinal:  Negative for abdominal pain and nausea.  Genitourinary:  Negative for difficulty urinating, frequency and vaginal pain.  Musculoskeletal:  Positive for gait problem. Negative for back pain.  Skin:   Negative for pallor and rash.  Neurological:  Negative for dizziness, tremors, weakness, numbness and headaches.  Psychiatric/Behavioral:  Negative for confusion, decreased concentration and sleep disturbance.     Objective:  BP 136/76 (BP Location: Right Arm, Patient Position: Sitting, Cuff Size: Normal)   Pulse 77   Temp 98.2 F (36.8 C) (Oral)   Ht '5\' 3"'$  (1.6 m)   Wt 154 lb (69.9 kg)   SpO2 94%   BMI 27.28 kg/m   BP Readings from Last 3 Encounters:  03/30/22 136/76  03/21/22 130/62  03/17/22 (!) 148/64    Wt Readings from Last 3 Encounters:  03/30/22 154 lb (69.9 kg)  03/21/22 154 lb 14.4 oz (70.3 kg)  03/17/22 150 lb 11.2 oz (68.4 kg)    Physical Exam Constitutional:      General: She is not in acute distress.    Appearance: She is well-developed. She is obese.  HENT:     Head: Normocephalic.     Right Ear: External ear normal.     Left Ear: External ear normal.     Nose: Nose normal.  Eyes:     General:        Right eye: No discharge.        Left eye: No discharge.     Conjunctiva/sclera: Conjunctivae normal.     Pupils: Pupils are equal, round, and reactive to light.  Neck:     Thyroid: No thyromegaly.     Vascular: No JVD.     Trachea: No tracheal deviation.  Cardiovascular:     Rate and Rhythm: Normal rate and regular rhythm.     Heart sounds: Normal heart sounds.  Pulmonary:     Effort: No respiratory distress.     Breath sounds: No stridor. No wheezing.  Abdominal:     General: Bowel sounds are normal. There is no distension.     Palpations: Abdomen is soft. There is no mass.     Tenderness: There is no abdominal tenderness. There is no guarding or rebound.  Musculoskeletal:        General: No tenderness.     Cervical back: Normal range of motion and neck supple. No rigidity.     Right lower leg: Edema present.     Left lower leg: Edema present.  Lymphadenopathy:     Cervical: No cervical adenopathy.  Skin:    Findings: No erythema or rash.   Neurological:     Cranial Nerves: No cranial nerve deficit.     Motor: No abnormal muscle tone.     Coordination: Coordination normal.     Deep Tendon Reflexes: Reflexes normal.  Psychiatric:        Behavior: Behavior normal.        Thought Content: Thought content normal.        Judgment: Judgment normal.     Lab Results  Component Value Date   WBC 9.2 03/20/2022   HGB 9.7 (A) 03/20/2022   HCT 30 (A) 03/20/2022   PLT 451 (A) 03/20/2022   GLUCOSE 109 (H) 03/09/2022   CHOL 157 09/07/2020   TRIG 121.0 09/07/2020   HDL 59.30 09/07/2020   LDLDIRECT 162.1 10/28/2009   LDLCALC 74 09/07/2020   ALT 19 02/16/2022   AST 25 02/16/2022   NA 138 03/09/2022   K 3.4 (L) 03/09/2022   CL 100 03/09/2022   CREATININE 2.55 (H) 03/09/2022   BUN 44 (H) 03/09/2022   CO2 27 03/09/2022   TSH 0.985 02/16/2022   INR 1.3 (H) 02/16/2022    DG Ankle Complete Left  Result Date: 03/07/2022 CLINICAL DATA:  Left ankle pain.  Hip dislocation. EXAM: LEFT ANKLE COMPLETE - 3+ VIEW COMPARISON:  None Available. FINDINGS: There is no evidence of fracture, dislocation, or joint effusion. There is no evidence of arthropathy or other focal bone abnormality. Soft tissues are unremarkable. Partially imaged splint and bandage over the distal leg/ankle. IMPRESSION: No acute fracture or dislocation. Electronically Signed   By: Keane Police D.O.   On: 03/07/2022 10:37  DG Pelvis Portable  Result Date: 03/06/2022 CLINICAL DATA:  Reduction of dislocation EXAM: PORTABLE PELVIS 1-2 VIEWS COMPARISON:  01/23/2022 FINDINGS: No recent fracture or dislocation is seen. There is previous left hip arthroplasty. There is interval clearing of pockets of air from the soft tissues since 01/23/2022. SI joints are symmetrical. Degenerative changes are noted in visualized lower lumbar spine. IMPRESSION: Previous left hip arthroplasty. No recent fracture or dislocation is seen. Lumbar spondylosis. Electronically Signed   By: Elmer Picker M.D.   On: 03/06/2022 14:57   DG Chest Port 1 View  Result Date: 03/06/2022 CLINICAL DATA:  Recent hip fracture complicated by DVT and PE, history of COPD. EXAM: PORTABLE CHEST 1 VIEW COMPARISON:  Chest radiograph 02/16/2022 FINDINGS: Median sternotomy wires and mediastinal surgical clips are again seen. The heart is mildly enlarged, unchanged. The upper mediastinal contours are stable. There are small bilateral pleural effusions with adjacent airspace opacities, left worse than right and worsened since 02/16/2022. The upper lobes are well aerated. There is no pneumothorax There is no acute osseous abnormality. IMPRESSION: Small left larger than right pleural effusions with adjacent bibasilar airspace opacities, worsened since 02/16/2022. Electronically Signed   By: Valetta Mole M.D.   On: 03/06/2022 11:24   DG HIP UNILAT WITH PELVIS 1V LEFT  Result Date: 03/06/2022 CLINICAL DATA:  Postreduction left hip.  [id number]. EXAM: DG HIP (WITH OR WITHOUT PELVIS) 1V*L* COMPARISON:  Earlier study today at 1:51 a.m. FINDINGS: Single AP view reveals relocation of the left femoral head prosthesis into the acetabular cup prosthesis. Evidence of fractures is not seen. IMPRESSION: Relocation of the left femoral head prosthesis into the acetabular cup prosthesis. Electronically Signed   By: Telford Nab M.D.   On: 03/06/2022 04:33   DG Hip Unilat With Pelvis 2-3 Views Left  Result Date: 03/06/2022 CLINICAL DATA:  Left hip pain. EXAM: DG HIP (WITH OR WITHOUT PELVIS) 2-3V LEFT COMPARISON:  None Available. FINDINGS: A total left hip replacement is seen with dorsal dislocation of the left femoral prosthesis. There is no evidence of an acute fracture. Soft tissue structures are unremarkable. IMPRESSION: Total left hip replacement with dorsal dislocation of the left femoral prosthesis. Electronically Signed   By: Virgina Norfolk M.D.   On: 03/06/2022 02:11    Assessment & Plan:   Problem List Items Addressed  This Visit     Acquired hypothyroidism    On Levothroid      Relevant Medications   carvedilol (COREG) 6.25 MG tablet   Other Relevant Orders   T4, free   Comprehensive metabolic panel   CKD (chronic kidney disease) stage 4, GFR 15-29 ml/min (HCC) - Primary    Cont on Amlodipine, Metoprolol, Lipitor Considering home HD Monitor GFR      Relevant Orders   CBC with Differential/Platelet   Comprehensive metabolic panel   DOE (dyspnea on exertion)    On Torsemide      Relevant Orders   CBC with Differential/Platelet   Lower GI bleeding    No bleeding - back on Eliquis Check CBC      Relevant Orders   CBC with Differential/Platelet   Comprehensive metabolic panel   Vitamin D deficiency    On Vit D         Meds ordered this encounter  Medications   carvedilol (COREG) 6.25 MG tablet    Sig: Take 1 tablet (6.25 mg total) by mouth 2 (two) times daily with a meal.    Dispense:  180  tablet    Refill:  3      Follow-up: No follow-ups on file.  [name], MD

## 2022-03-30 NOTE — Assessment & Plan Note (Signed)
No bleeding - back on Eliquis Check CBC

## 2022-03-30 NOTE — Assessment & Plan Note (Signed)
On Levothroid 

## 2022-03-31 DIAGNOSIS — R2681 Unsteadiness on feet: Secondary | ICD-10-CM | POA: Diagnosis not present

## 2022-03-31 DIAGNOSIS — R2689 Other abnormalities of gait and mobility: Secondary | ICD-10-CM | POA: Diagnosis not present

## 2022-03-31 DIAGNOSIS — R29898 Other symptoms and signs involving the musculoskeletal system: Secondary | ICD-10-CM | POA: Diagnosis not present

## 2022-03-31 DIAGNOSIS — S73005D Unspecified dislocation of left hip, subsequent encounter: Secondary | ICD-10-CM | POA: Diagnosis not present

## 2022-03-31 DIAGNOSIS — M6281 Muscle weakness (generalized): Secondary | ICD-10-CM | POA: Diagnosis not present

## 2022-03-31 DIAGNOSIS — N184 Chronic kidney disease, stage 4 (severe): Secondary | ICD-10-CM | POA: Diagnosis not present

## 2022-04-03 DIAGNOSIS — M0579 Rheumatoid arthritis with rheumatoid factor of multiple sites without organ or systems involvement: Secondary | ICD-10-CM | POA: Diagnosis not present

## 2022-04-03 DIAGNOSIS — Z79899 Other long term (current) drug therapy: Secondary | ICD-10-CM | POA: Diagnosis not present

## 2022-04-04 DIAGNOSIS — R2689 Other abnormalities of gait and mobility: Secondary | ICD-10-CM | POA: Diagnosis not present

## 2022-04-04 DIAGNOSIS — M6281 Muscle weakness (generalized): Secondary | ICD-10-CM | POA: Diagnosis not present

## 2022-04-04 DIAGNOSIS — R2681 Unsteadiness on feet: Secondary | ICD-10-CM | POA: Diagnosis not present

## 2022-04-04 DIAGNOSIS — N184 Chronic kidney disease, stage 4 (severe): Secondary | ICD-10-CM | POA: Diagnosis not present

## 2022-04-04 DIAGNOSIS — S73005D Unspecified dislocation of left hip, subsequent encounter: Secondary | ICD-10-CM | POA: Diagnosis not present

## 2022-04-05 DIAGNOSIS — R29898 Other symptoms and signs involving the musculoskeletal system: Secondary | ICD-10-CM | POA: Diagnosis not present

## 2022-04-05 DIAGNOSIS — R2681 Unsteadiness on feet: Secondary | ICD-10-CM | POA: Diagnosis not present

## 2022-04-05 DIAGNOSIS — N184 Chronic kidney disease, stage 4 (severe): Secondary | ICD-10-CM | POA: Diagnosis not present

## 2022-04-05 DIAGNOSIS — R2689 Other abnormalities of gait and mobility: Secondary | ICD-10-CM | POA: Diagnosis not present

## 2022-04-05 DIAGNOSIS — M6281 Muscle weakness (generalized): Secondary | ICD-10-CM | POA: Diagnosis not present

## 2022-04-05 DIAGNOSIS — S73005D Unspecified dislocation of left hip, subsequent encounter: Secondary | ICD-10-CM | POA: Diagnosis not present

## 2022-04-06 ENCOUNTER — Ambulatory Visit (HOSPITAL_COMMUNITY)
Admission: RE | Admit: 2022-04-06 | Discharge: 2022-04-06 | Disposition: A | Discharge status: Home / Self Care | Payer: Medicare PPO | Source: Ambulatory Visit | Attending: Nephrology | Admitting: Nephrology

## 2022-04-06 VITALS — BP 101/53 | HR 94 | Temp 97.2°F | Resp 18

## 2022-04-06 DIAGNOSIS — N184 Chronic kidney disease, stage 4 (severe): Secondary | ICD-10-CM | POA: Insufficient documentation

## 2022-04-06 MED ORDER — EPOETIN ALFA-EPBX 10000 UNIT/ML IJ SOLN
10000.0000 [IU] | INTRAMUSCULAR | Status: DC
  Administered 2022-04-06: 10000 [IU] via SUBCUTANEOUS

## 2022-04-06 MED ORDER — EPOETIN ALFA-EPBX 10000 UNIT/ML IJ SOLN
INTRAMUSCULAR | Status: AC
  Filled 2022-04-06: qty 1

## 2022-04-07 DIAGNOSIS — M6281 Muscle weakness (generalized): Secondary | ICD-10-CM | POA: Diagnosis not present

## 2022-04-07 DIAGNOSIS — R29898 Other symptoms and signs involving the musculoskeletal system: Secondary | ICD-10-CM | POA: Diagnosis not present

## 2022-04-07 DIAGNOSIS — N184 Chronic kidney disease, stage 4 (severe): Secondary | ICD-10-CM | POA: Diagnosis not present

## 2022-04-07 DIAGNOSIS — S73005D Unspecified dislocation of left hip, subsequent encounter: Secondary | ICD-10-CM | POA: Diagnosis not present

## 2022-04-07 LAB — POCT HEMOGLOBIN-HEMACUE: Hemoglobin: 9.3 g/dL — ABNORMAL LOW (ref 12.0–15.0)

## 2022-04-10 DIAGNOSIS — M6281 Muscle weakness (generalized): Secondary | ICD-10-CM | POA: Diagnosis not present

## 2022-04-10 DIAGNOSIS — N184 Chronic kidney disease, stage 4 (severe): Secondary | ICD-10-CM | POA: Diagnosis not present

## 2022-04-10 DIAGNOSIS — R29898 Other symptoms and signs involving the musculoskeletal system: Secondary | ICD-10-CM | POA: Diagnosis not present

## 2022-04-10 DIAGNOSIS — S73005D Unspecified dislocation of left hip, subsequent encounter: Secondary | ICD-10-CM | POA: Diagnosis not present

## 2022-04-11 DIAGNOSIS — S73005D Unspecified dislocation of left hip, subsequent encounter: Secondary | ICD-10-CM | POA: Diagnosis not present

## 2022-04-11 DIAGNOSIS — N184 Chronic kidney disease, stage 4 (severe): Secondary | ICD-10-CM | POA: Diagnosis not present

## 2022-04-11 DIAGNOSIS — R2681 Unsteadiness on feet: Secondary | ICD-10-CM | POA: Diagnosis not present

## 2022-04-11 DIAGNOSIS — M6281 Muscle weakness (generalized): Secondary | ICD-10-CM | POA: Diagnosis not present

## 2022-04-11 DIAGNOSIS — R2689 Other abnormalities of gait and mobility: Secondary | ICD-10-CM | POA: Diagnosis not present

## 2022-04-12 DIAGNOSIS — N184 Chronic kidney disease, stage 4 (severe): Secondary | ICD-10-CM | POA: Diagnosis not present

## 2022-04-12 DIAGNOSIS — S73005D Unspecified dislocation of left hip, subsequent encounter: Secondary | ICD-10-CM | POA: Diagnosis not present

## 2022-04-12 DIAGNOSIS — R29898 Other symptoms and signs involving the musculoskeletal system: Secondary | ICD-10-CM | POA: Diagnosis not present

## 2022-04-12 DIAGNOSIS — M6281 Muscle weakness (generalized): Secondary | ICD-10-CM | POA: Diagnosis not present

## 2022-04-13 DIAGNOSIS — H52203 Unspecified astigmatism, bilateral: Secondary | ICD-10-CM | POA: Diagnosis not present

## 2022-04-13 DIAGNOSIS — S73005D Unspecified dislocation of left hip, subsequent encounter: Secondary | ICD-10-CM | POA: Diagnosis not present

## 2022-04-13 DIAGNOSIS — R2681 Unsteadiness on feet: Secondary | ICD-10-CM | POA: Diagnosis not present

## 2022-04-13 DIAGNOSIS — N184 Chronic kidney disease, stage 4 (severe): Secondary | ICD-10-CM | POA: Diagnosis not present

## 2022-04-13 DIAGNOSIS — H524 Presbyopia: Secondary | ICD-10-CM | POA: Diagnosis not present

## 2022-04-13 DIAGNOSIS — H26491 Other secondary cataract, right eye: Secondary | ICD-10-CM | POA: Diagnosis not present

## 2022-04-13 DIAGNOSIS — M6281 Muscle weakness (generalized): Secondary | ICD-10-CM | POA: Diagnosis not present

## 2022-04-13 DIAGNOSIS — Z961 Presence of intraocular lens: Secondary | ICD-10-CM | POA: Diagnosis not present

## 2022-04-13 DIAGNOSIS — R2689 Other abnormalities of gait and mobility: Secondary | ICD-10-CM | POA: Diagnosis not present

## 2022-04-14 DIAGNOSIS — M6281 Muscle weakness (generalized): Secondary | ICD-10-CM | POA: Diagnosis not present

## 2022-04-14 DIAGNOSIS — S73005D Unspecified dislocation of left hip, subsequent encounter: Secondary | ICD-10-CM | POA: Diagnosis not present

## 2022-04-14 DIAGNOSIS — R29898 Other symptoms and signs involving the musculoskeletal system: Secondary | ICD-10-CM | POA: Diagnosis not present

## 2022-04-14 DIAGNOSIS — N184 Chronic kidney disease, stage 4 (severe): Secondary | ICD-10-CM | POA: Diagnosis not present

## 2022-04-17 DIAGNOSIS — M6281 Muscle weakness (generalized): Secondary | ICD-10-CM | POA: Diagnosis not present

## 2022-04-17 DIAGNOSIS — S73005D Unspecified dislocation of left hip, subsequent encounter: Secondary | ICD-10-CM | POA: Diagnosis not present

## 2022-04-17 DIAGNOSIS — R29898 Other symptoms and signs involving the musculoskeletal system: Secondary | ICD-10-CM | POA: Diagnosis not present

## 2022-04-17 DIAGNOSIS — N184 Chronic kidney disease, stage 4 (severe): Secondary | ICD-10-CM | POA: Diagnosis not present

## 2022-04-18 ENCOUNTER — Ambulatory Visit: Payer: Medicare PPO | Admitting: Internal Medicine

## 2022-04-18 ENCOUNTER — Other Ambulatory Visit: Payer: Self-pay | Admitting: Nurse Practitioner

## 2022-04-18 DIAGNOSIS — S73005D Unspecified dislocation of left hip, subsequent encounter: Secondary | ICD-10-CM | POA: Diagnosis not present

## 2022-04-18 DIAGNOSIS — N184 Chronic kidney disease, stage 4 (severe): Secondary | ICD-10-CM | POA: Diagnosis not present

## 2022-04-18 DIAGNOSIS — Z1231 Encounter for screening mammogram for malignant neoplasm of breast: Secondary | ICD-10-CM | POA: Diagnosis not present

## 2022-04-18 DIAGNOSIS — M6281 Muscle weakness (generalized): Secondary | ICD-10-CM | POA: Diagnosis not present

## 2022-04-18 DIAGNOSIS — R2681 Unsteadiness on feet: Secondary | ICD-10-CM | POA: Diagnosis not present

## 2022-04-18 DIAGNOSIS — R2689 Other abnormalities of gait and mobility: Secondary | ICD-10-CM | POA: Diagnosis not present

## 2022-04-18 LAB — HM MAMMOGRAPHY

## 2022-04-19 DIAGNOSIS — R29898 Other symptoms and signs involving the musculoskeletal system: Secondary | ICD-10-CM | POA: Diagnosis not present

## 2022-04-19 DIAGNOSIS — M6281 Muscle weakness (generalized): Secondary | ICD-10-CM | POA: Diagnosis not present

## 2022-04-19 DIAGNOSIS — S73005D Unspecified dislocation of left hip, subsequent encounter: Secondary | ICD-10-CM | POA: Diagnosis not present

## 2022-04-19 DIAGNOSIS — N184 Chronic kidney disease, stage 4 (severe): Secondary | ICD-10-CM | POA: Diagnosis not present

## 2022-04-20 ENCOUNTER — Other Ambulatory Visit: Payer: Self-pay | Admitting: Obstetrics

## 2022-04-20 ENCOUNTER — Encounter: Payer: Self-pay | Admitting: Internal Medicine

## 2022-04-20 DIAGNOSIS — R2681 Unsteadiness on feet: Secondary | ICD-10-CM | POA: Diagnosis not present

## 2022-04-20 DIAGNOSIS — R2689 Other abnormalities of gait and mobility: Secondary | ICD-10-CM | POA: Diagnosis not present

## 2022-04-20 DIAGNOSIS — N184 Chronic kidney disease, stage 4 (severe): Secondary | ICD-10-CM | POA: Diagnosis not present

## 2022-04-20 DIAGNOSIS — M810 Age-related osteoporosis without current pathological fracture: Secondary | ICD-10-CM

## 2022-04-20 DIAGNOSIS — S73005D Unspecified dislocation of left hip, subsequent encounter: Secondary | ICD-10-CM | POA: Diagnosis not present

## 2022-04-20 DIAGNOSIS — M6281 Muscle weakness (generalized): Secondary | ICD-10-CM | POA: Diagnosis not present

## 2022-04-21 DIAGNOSIS — S73005D Unspecified dislocation of left hip, subsequent encounter: Secondary | ICD-10-CM | POA: Diagnosis not present

## 2022-04-21 DIAGNOSIS — M6281 Muscle weakness (generalized): Secondary | ICD-10-CM | POA: Diagnosis not present

## 2022-04-21 DIAGNOSIS — R29898 Other symptoms and signs involving the musculoskeletal system: Secondary | ICD-10-CM | POA: Diagnosis not present

## 2022-04-21 DIAGNOSIS — N184 Chronic kidney disease, stage 4 (severe): Secondary | ICD-10-CM | POA: Diagnosis not present

## 2022-04-22 ENCOUNTER — Other Ambulatory Visit: Payer: Self-pay | Admitting: Internal Medicine

## 2022-04-26 DIAGNOSIS — R338 Other retention of urine: Secondary | ICD-10-CM | POA: Diagnosis not present

## 2022-04-26 DIAGNOSIS — M6281 Muscle weakness (generalized): Secondary | ICD-10-CM | POA: Diagnosis not present

## 2022-04-26 DIAGNOSIS — R350 Frequency of micturition: Secondary | ICD-10-CM | POA: Diagnosis not present

## 2022-04-26 DIAGNOSIS — R29898 Other symptoms and signs involving the musculoskeletal system: Secondary | ICD-10-CM | POA: Diagnosis not present

## 2022-04-26 DIAGNOSIS — N184 Chronic kidney disease, stage 4 (severe): Secondary | ICD-10-CM | POA: Diagnosis not present

## 2022-04-26 DIAGNOSIS — S73005D Unspecified dislocation of left hip, subsequent encounter: Secondary | ICD-10-CM | POA: Diagnosis not present

## 2022-04-26 DIAGNOSIS — R351 Nocturia: Secondary | ICD-10-CM | POA: Diagnosis not present

## 2022-04-27 ENCOUNTER — Ambulatory Visit (INDEPENDENT_AMBULATORY_CARE_PROVIDER_SITE_OTHER): Payer: Medicare PPO

## 2022-04-27 ENCOUNTER — Encounter: Payer: Self-pay | Admitting: Physician Assistant

## 2022-04-27 ENCOUNTER — Ambulatory Visit (INDEPENDENT_AMBULATORY_CARE_PROVIDER_SITE_OTHER): Payer: Medicare PPO | Admitting: Physician Assistant

## 2022-04-27 DIAGNOSIS — L821 Other seborrheic keratosis: Secondary | ICD-10-CM | POA: Diagnosis not present

## 2022-04-27 DIAGNOSIS — D2272 Melanocytic nevi of left lower limb, including hip: Secondary | ICD-10-CM | POA: Diagnosis not present

## 2022-04-27 DIAGNOSIS — S73005D Unspecified dislocation of left hip, subsequent encounter: Secondary | ICD-10-CM | POA: Diagnosis not present

## 2022-04-27 DIAGNOSIS — C44712 Basal cell carcinoma of skin of right lower limb, including hip: Secondary | ICD-10-CM | POA: Diagnosis not present

## 2022-04-27 DIAGNOSIS — C4441 Basal cell carcinoma of skin of scalp and neck: Secondary | ICD-10-CM | POA: Diagnosis not present

## 2022-04-27 DIAGNOSIS — L720 Epidermal cyst: Secondary | ICD-10-CM | POA: Diagnosis not present

## 2022-04-27 DIAGNOSIS — C44319 Basal cell carcinoma of skin of other parts of face: Secondary | ICD-10-CM | POA: Diagnosis not present

## 2022-04-27 DIAGNOSIS — Z96642 Presence of left artificial hip joint: Secondary | ICD-10-CM

## 2022-04-27 DIAGNOSIS — D2262 Melanocytic nevi of left upper limb, including shoulder: Secondary | ICD-10-CM | POA: Diagnosis not present

## 2022-04-27 DIAGNOSIS — D485 Neoplasm of uncertain behavior of skin: Secondary | ICD-10-CM | POA: Diagnosis not present

## 2022-04-27 DIAGNOSIS — Z85828 Personal history of other malignant neoplasm of skin: Secondary | ICD-10-CM | POA: Diagnosis not present

## 2022-04-27 NOTE — Progress Notes (Signed)
Post-Op Visit Note   Patient: [name]           Date of Birth: 23-Jan-1941           MRN: [id number] Visit Date: 04/27/2022 PCP: Cassandria Anger, MD   Assessment & Plan:  Chief Complaint:  Chief Complaint  Patient presents with   Left Hip - Follow-up    S/p hip dislocation   Visit Diagnoses:  1. Status post total replacement of left hip   2. Dislocation of left hip, subsequent encounter     Plan: Patient is a pleasant 81 year old female who comes in today approximately 3 months status post left total hip replacement 01/23/2022 with subsequent dislocation on 03/06/2022.  She has been doing well.  She is residing at friend's home and is getting physical therapy 5 days a week.  She feels as though she is making great progress.  She is in no pain.  Examination of her left hip reveals painless hip flexion and logroll.  She will continue with posterior hip precautions for another 6 weeks.  She will follow-up with [place] at that time for recheck.  Call with concerns or questions.  Follow-Up Instructions: Return in about 6 weeks (around 06/08/2022).   Orders:  Orders Placed This Encounter  Procedures   XR Pelvis 1-2 Views   No orders of the defined types were placed in this encounter.   Imaging: XR Pelvis 1-2 Views  Result Date: 04/27/2022 Well-seated prosthesis without complication   PMFS History: Patient Active Problem List   Diagnosis Date Noted   Gallstones 03/17/2022   AKI (acute kidney injury) ([place]) 03/07/2022   Acute renal failure superimposed on stage 4 chronic kidney disease ([place]) 03/06/2022   High anion gap metabolic acidosis 37/16/9678   Hip dislocation, left ([place]) 03/06/2022   History of anemia due to chronic kidney disease 03/06/2022   History of DVT (deep vein thrombosis) 03/06/2022   Allergic rhinitis 03/06/2022   Tachycardia 02/06/2022   Pulmonary emboli ([place]) 02/06/2022   Urinary retention 01/30/2022   DVT (deep venous thrombosis) ([place]) 01/30/2022    Vitamin D deficiency 01/30/2022   Slow transit constipation 01/30/2022   GERD (gastroesophageal reflux disease) 01/30/2022   Status post total replacement of left hip 01/23/2022   Lower GI bleeding 11/18/2021   Hematochezia    Acute blood loss anemia (ABLA)    BRBPR (bright red blood per rectum) 11/17/2021   CKD (chronic kidney disease) stage 4, GFR 15-29 ml/min (HCC) 11/17/2021   Primary osteoarthritis of left hip 10/06/2021   Left hip pain 12/09/2020   Aortic atherosclerosis ([place]) 09/07/2020   Gait disorder 06/08/2020   Neoplasm of uncertain behavior of skin 03/14/2020   Contusion of face 03/10/2020   Arthralgia 12/25/2019   Ankle swelling 08/08/2019   Hyperkalemia 05/15/2019   Osteoporosis 02/26/2018   Colon polyp 06/28/2016   COPD (chronic obstructive pulmonary disease) ([place]) 05/28/2016   Upper airway cough syndrome 05/22/2016   Wrist pain, right 12/14/2015   Lung mass 03/02/2015   Pre-operative cardiovascular examination 02/21/2015   DOE (dyspnea on exertion) 02/16/2015   TIA (transient ischemic attack) 12/31/2014   Abnormal mental state 12/30/2014   Diarrhea 11/26/2014   Well adult exam 11/26/2014   Seasonal allergies 12/31/2013   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   Cerumen impaction 03/20/2013   Hyperlipidemia    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   Long term (current) use of anticoagulants 11/08/2010   Rheumatoid arthritis ([place]) 06/23/2010   Pain in  joint 11/26/2009   Acquired hypothyroidism 12/08/2006   Macular degeneration (senile) of retina 12/08/2006   Essential hypertension 12/08/2006   Diaphragmatic hernia 12/08/2006   ASPLENIA 12/08/2006   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) 12/22/1994    Class: History of   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; now atretic and significantly improved LAD lesion without intervention 12/22/1994    Class: History of   Past Medical History:   Diagnosis Date   Abdominal aortic aneurysm ([place])    REPAIRED IN 1996 BY [name]  AND HAS RECENTLY BEEN FOLLOWED BY [name]   Acquired asplenia     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 Most likely scar. Mild subendocardial ischemia. EF 71% LOW RISK.    Cervical disc disease    fracture   Chronic kidney disease    COPD (chronic obstructive pulmonary disease) (HCC)    Diverticulosis    DVT (deep venous thrombosis) ([place])    RLE; Sept '23   History of pulmonary embolus ([place])    Oct '23   Hyperlipidemia    Hypertension    Hypothyroidism (acquired)    hypo   Myocardial infarction ([place]) 11/2014   TIA   Polymyalgia rheumatica ([place])    2011 Dr. Charlestine Night   Rheumatoid arthritis [facility]) 2011   Dr.Truslow; fracture knees, hands and wrists -    S/P CABG x 1 1996   CABG--LIMA-LAD for ostial LAD (not felt to be PCI amenable). EF NORMAL then; LIMA now atretic   Shortness of breath dyspnea    with exertion   Stroke ([place]) 11/2014   TIA    Urinary frequency     Family History  Problem Relation Age of Onset   Hypertension Mother    Diabetes Mother    Heart disease Mother    Hyperlipidemia Mother    Stroke Father    Hyperlipidemia Sister    Hypertension Sister    Hypertension Daughter    Cancer Paternal Uncle        Deceased from cancer not sure of site   Hypertension Son    Hyperlipidemia Son    Hyperlipidemia Son    Hypertension Son    Hyperlipidemia Son    Hypertension Son    Hypertension Other    Coronary artery disease Other    Asthma  Neg Hx    Colon cancer Neg Hx     Past Surgical History:  Procedure Laterality Date   ABDOMINAL AORTIC ANEURYSM REPAIR  6834   Complicated by mesenteric artery stenosis and splenic artery infarction with acquired Asplenia   APPENDECTOMY     BUNIONECTOMY  07/2011   right foot   CARDIAC CATHETERIZATION  2005   (Most recent CATH) - ostial LAD lesion 20-30% (down from 90% initially). Atretic LIMA. Minimal disease the RCA and Circumflex system.   CARPAL TUNNEL RELEASE Left    CATARACT EXTRACTION Bilateral    CERVICAL SPINE SURGERY  plate 2008 Dr. Saintclair Halsted   COLONOSCOPY     CORONARY ARTERY BYPASS GRAFT  1996   INCLUDED AN INTERNAL MAMMARY ARTERY TO THE LAD. EF WAS NORMAL   INGUINAL HERNIA REPAIR Right    LAPAROSCOPIC APPENDECTOMY N/A 06/28/2016   Procedure: APPENDECTOMY LAPAROSCOPIC;  Surgeon: Leighton Ruff, MD;  Location: [facility];  Service: General;  Laterality: N/A;   NM MYOVIEW LTD  06/2010   Fixed anteroseptal, apical and inferoapical defect with moderate size. Most likely scar. Mild subendocardial ischemia. EF 71% LOW RISK.    SPLENECTOMY     TOTAL HIP ARTHROPLASTY Left 01/23/2022   Procedure: LEFT TOTAL HIP ARTHROPLASTY ANTERIOR APPROACH;  Surgeon: Leandrew Koyanagi, MD;  Location: [place];  Service: Orthopedics;  Laterality: Left;  3-C   TRANSTHORACIC ECHOCARDIOGRAM  12/2014   [facility] [place]: Normal LV size & function. EF 55-60%,    vagina polyp     VIDEO ASSISTED THORACOSCOPY (VATS)/WEDGE RESECTION Left 03/02/2015   Procedure: VIDEO ASSISTED THORACOSCOPY (VATS), MINI THORACOTOMY, LEFT UPPER LOBE WEDGE, TAKE DOWN OF INTERNAL MAMMARY LESIONS, PLACEMENT OF ON-Q PUMP;  Surgeon: Grace Isaac, MD;  Location: [place];  Service: Thoracic;  Laterality: Left;   VIDEO BRONCHOSCOPY N/A 03/02/2015   Procedure: BRONCHOSCOPY;  Surgeon: Grace Isaac, MD;  Location: [place];  Service: Thoracic;  Laterality: N/A;   VIDEO BRONCHOSCOPY WITH ENDOBRONCHIAL NAVIGATION N/A 10/08/2017   Procedure:  VIDEO BRONCHOSCOPY WITH ENDOBRONCHIAL NAVIGATION WITH BIOPSIES OF LEFT UPPER LOBE AND LEFT LOWER LOBE;  Surgeon: Grace Isaac, MD;  Location: [place];  Service: Thoracic;  Laterality: N/A;   Social History   Occupational History   Occupation: retired    Fish farm manager: RETIRED  Tobacco Use   Smoking status: Former    Types: Cigarettes    Quit date: 12/04/1958    Years since quitting: 63.4    Passive exposure: Never   Smokeless tobacco: Never  Vaping Use   Vaping Use: Never used  Substance and Sexual Activity   Alcohol use: Yes    Comment: hardly ever   Drug use: No   Sexual activity: Not Currently

## 2022-04-28 ENCOUNTER — Telehealth: Payer: Self-pay | Admitting: Internal Medicine

## 2022-04-28 DIAGNOSIS — M6281 Muscle weakness (generalized): Secondary | ICD-10-CM | POA: Diagnosis not present

## 2022-04-28 DIAGNOSIS — R29898 Other symptoms and signs involving the musculoskeletal system: Secondary | ICD-10-CM | POA: Diagnosis not present

## 2022-04-28 DIAGNOSIS — N184 Chronic kidney disease, stage 4 (severe): Secondary | ICD-10-CM | POA: Diagnosis not present

## 2022-04-28 DIAGNOSIS — S73005D Unspecified dislocation of left hip, subsequent encounter: Secondary | ICD-10-CM | POA: Diagnosis not present

## 2022-04-28 MED ORDER — APIXABAN 2.5 MG PO TABS: 2.5000 mg | ORAL_TABLET | Freq: Two times a day (BID) | ORAL | 1 refills | Status: DC

## 2022-04-28 NOTE — Telephone Encounter (Signed)
Caller & Relationship to patient: Pharmacy [facility])  Call back number: N/A  Date of last office visit: 03/30/2022  Date of next office visit: 06/29/2022  Medication(s) to be refilled:  apixaban (ELIQUIS) 2.5 MG TABS tablet   Preferred Pharmacy:   [facility], [place]

## 2022-04-28 NOTE — Telephone Encounter (Signed)
[facility]Marland KitchenJohny [name]

## 2022-05-02 DIAGNOSIS — M6281 Muscle weakness (generalized): Secondary | ICD-10-CM | POA: Diagnosis not present

## 2022-05-02 DIAGNOSIS — R2681 Unsteadiness on feet: Secondary | ICD-10-CM | POA: Diagnosis not present

## 2022-05-02 DIAGNOSIS — N184 Chronic kidney disease, stage 4 (severe): Secondary | ICD-10-CM | POA: Diagnosis not present

## 2022-05-02 DIAGNOSIS — R2689 Other abnormalities of gait and mobility: Secondary | ICD-10-CM | POA: Diagnosis not present

## 2022-05-02 DIAGNOSIS — S73005D Unspecified dislocation of left hip, subsequent encounter: Secondary | ICD-10-CM | POA: Diagnosis not present

## 2022-05-03 DIAGNOSIS — R29898 Other symptoms and signs involving the musculoskeletal system: Secondary | ICD-10-CM | POA: Diagnosis not present

## 2022-05-03 DIAGNOSIS — M6281 Muscle weakness (generalized): Secondary | ICD-10-CM | POA: Diagnosis not present

## 2022-05-03 DIAGNOSIS — N184 Chronic kidney disease, stage 4 (severe): Secondary | ICD-10-CM | POA: Diagnosis not present

## 2022-05-03 DIAGNOSIS — S73005D Unspecified dislocation of left hip, subsequent encounter: Secondary | ICD-10-CM | POA: Diagnosis not present

## 2022-05-04 ENCOUNTER — Encounter (HOSPITAL_COMMUNITY)
Admission: RE | Admit: 2022-05-04 | Discharge: 2022-05-04 | Disposition: A | Discharge status: Home / Self Care | Payer: Medicare PPO | Source: Ambulatory Visit | Attending: Nephrology | Admitting: Nephrology

## 2022-05-04 VITALS — BP 119/64 | HR 96 | Temp 97.4°F | Resp 18

## 2022-05-04 DIAGNOSIS — N184 Chronic kidney disease, stage 4 (severe): Secondary | ICD-10-CM | POA: Insufficient documentation

## 2022-05-04 LAB — IRON AND TIBC
Iron: 62 ug/dL (ref 28–170)
Saturation Ratios: 18 % (ref 10.4–31.8)
TIBC: 346 ug/dL (ref 250–450)
UIBC: 284 ug/dL

## 2022-05-04 LAB — FERRITIN: Ferritin: 26 ng/mL (ref 11–307)

## 2022-05-04 LAB — POCT HEMOGLOBIN-HEMACUE: Hemoglobin: 9.6 g/dL — ABNORMAL LOW (ref 12.0–15.0)

## 2022-05-04 MED ORDER — EPOETIN ALFA-EPBX 10000 UNIT/ML IJ SOLN
INTRAMUSCULAR | Status: AC
  Filled 2022-05-04: qty 1

## 2022-05-04 MED ORDER — EPOETIN ALFA-EPBX 10000 UNIT/ML IJ SOLN
10000.0000 [IU] | INTRAMUSCULAR | Status: DC
  Administered 2022-05-04: 10000 [IU] via SUBCUTANEOUS

## 2022-05-05 DIAGNOSIS — R29898 Other symptoms and signs involving the musculoskeletal system: Secondary | ICD-10-CM | POA: Diagnosis not present

## 2022-05-05 DIAGNOSIS — R2681 Unsteadiness on feet: Secondary | ICD-10-CM | POA: Diagnosis not present

## 2022-05-05 DIAGNOSIS — R2689 Other abnormalities of gait and mobility: Secondary | ICD-10-CM | POA: Diagnosis not present

## 2022-05-05 DIAGNOSIS — N184 Chronic kidney disease, stage 4 (severe): Secondary | ICD-10-CM | POA: Diagnosis not present

## 2022-05-05 DIAGNOSIS — S73005D Unspecified dislocation of left hip, subsequent encounter: Secondary | ICD-10-CM | POA: Diagnosis not present

## 2022-05-05 DIAGNOSIS — M6281 Muscle weakness (generalized): Secondary | ICD-10-CM | POA: Diagnosis not present

## 2022-05-08 DIAGNOSIS — S73005D Unspecified dislocation of left hip, subsequent encounter: Secondary | ICD-10-CM | POA: Diagnosis not present

## 2022-05-08 DIAGNOSIS — M6281 Muscle weakness (generalized): Secondary | ICD-10-CM | POA: Diagnosis not present

## 2022-05-08 DIAGNOSIS — R29898 Other symptoms and signs involving the musculoskeletal system: Secondary | ICD-10-CM | POA: Diagnosis not present

## 2022-05-08 DIAGNOSIS — N184 Chronic kidney disease, stage 4 (severe): Secondary | ICD-10-CM | POA: Diagnosis not present

## 2022-05-09 ENCOUNTER — Encounter: Payer: Self-pay | Admitting: Internal Medicine

## 2022-05-09 DIAGNOSIS — R2681 Unsteadiness on feet: Secondary | ICD-10-CM | POA: Diagnosis not present

## 2022-05-09 DIAGNOSIS — M6281 Muscle weakness (generalized): Secondary | ICD-10-CM | POA: Diagnosis not present

## 2022-05-09 DIAGNOSIS — R2689 Other abnormalities of gait and mobility: Secondary | ICD-10-CM | POA: Diagnosis not present

## 2022-05-09 DIAGNOSIS — S73005D Unspecified dislocation of left hip, subsequent encounter: Secondary | ICD-10-CM | POA: Diagnosis not present

## 2022-05-09 DIAGNOSIS — N184 Chronic kidney disease, stage 4 (severe): Secondary | ICD-10-CM | POA: Diagnosis not present

## 2022-05-10 DIAGNOSIS — S73005D Unspecified dislocation of left hip, subsequent encounter: Secondary | ICD-10-CM | POA: Diagnosis not present

## 2022-05-10 DIAGNOSIS — M6281 Muscle weakness (generalized): Secondary | ICD-10-CM | POA: Diagnosis not present

## 2022-05-10 DIAGNOSIS — R29898 Other symptoms and signs involving the musculoskeletal system: Secondary | ICD-10-CM | POA: Diagnosis not present

## 2022-05-10 DIAGNOSIS — N184 Chronic kidney disease, stage 4 (severe): Secondary | ICD-10-CM | POA: Diagnosis not present

## 2022-05-11 DIAGNOSIS — N184 Chronic kidney disease, stage 4 (severe): Secondary | ICD-10-CM | POA: Diagnosis not present

## 2022-05-11 DIAGNOSIS — M6281 Muscle weakness (generalized): Secondary | ICD-10-CM | POA: Diagnosis not present

## 2022-05-11 DIAGNOSIS — R2689 Other abnormalities of gait and mobility: Secondary | ICD-10-CM | POA: Diagnosis not present

## 2022-05-11 DIAGNOSIS — S73005D Unspecified dislocation of left hip, subsequent encounter: Secondary | ICD-10-CM | POA: Diagnosis not present

## 2022-05-11 DIAGNOSIS — R2681 Unsteadiness on feet: Secondary | ICD-10-CM | POA: Diagnosis not present

## 2022-05-12 DIAGNOSIS — S73005D Unspecified dislocation of left hip, subsequent encounter: Secondary | ICD-10-CM | POA: Diagnosis not present

## 2022-05-12 DIAGNOSIS — R29898 Other symptoms and signs involving the musculoskeletal system: Secondary | ICD-10-CM | POA: Diagnosis not present

## 2022-05-12 DIAGNOSIS — N184 Chronic kidney disease, stage 4 (severe): Secondary | ICD-10-CM | POA: Diagnosis not present

## 2022-05-12 DIAGNOSIS — M6281 Muscle weakness (generalized): Secondary | ICD-10-CM | POA: Diagnosis not present

## 2022-05-15 DIAGNOSIS — R29898 Other symptoms and signs involving the musculoskeletal system: Secondary | ICD-10-CM | POA: Diagnosis not present

## 2022-05-15 DIAGNOSIS — M6281 Muscle weakness (generalized): Secondary | ICD-10-CM | POA: Diagnosis not present

## 2022-05-15 DIAGNOSIS — S73005D Unspecified dislocation of left hip, subsequent encounter: Secondary | ICD-10-CM | POA: Diagnosis not present

## 2022-05-15 DIAGNOSIS — N184 Chronic kidney disease, stage 4 (severe): Secondary | ICD-10-CM | POA: Diagnosis not present

## 2022-05-16 ENCOUNTER — Other Ambulatory Visit (HOSPITAL_COMMUNITY): Payer: Self-pay | Admitting: *Deleted

## 2022-05-17 ENCOUNTER — Encounter (HOSPITAL_COMMUNITY)
Admission: RE | Admit: 2022-05-17 | Discharge: 2022-05-17 | Disposition: A | Discharge status: Home / Self Care | Payer: Medicare PPO | Source: Ambulatory Visit | Attending: Nephrology | Admitting: Nephrology

## 2022-05-17 DIAGNOSIS — S73005D Unspecified dislocation of left hip, subsequent encounter: Secondary | ICD-10-CM | POA: Diagnosis not present

## 2022-05-17 DIAGNOSIS — N184 Chronic kidney disease, stage 4 (severe): Secondary | ICD-10-CM | POA: Diagnosis not present

## 2022-05-17 DIAGNOSIS — M6281 Muscle weakness (generalized): Secondary | ICD-10-CM | POA: Diagnosis not present

## 2022-05-17 DIAGNOSIS — R29898 Other symptoms and signs involving the musculoskeletal system: Secondary | ICD-10-CM | POA: Diagnosis not present

## 2022-05-17 MED ORDER — SODIUM CHLORIDE 0.9 % IV SOLN
510.0000 mg | Freq: Once | INTRAVENOUS | Status: AC
  Administered 2022-05-17: 510 mg via INTRAVENOUS
  Filled 2022-05-17: qty 510

## 2022-05-19 DIAGNOSIS — B351 Tinea unguium: Secondary | ICD-10-CM | POA: Diagnosis not present

## 2022-05-19 DIAGNOSIS — L84 Corns and callosities: Secondary | ICD-10-CM | POA: Diagnosis not present

## 2022-05-19 DIAGNOSIS — M79672 Pain in left foot: Secondary | ICD-10-CM | POA: Diagnosis not present

## 2022-05-19 DIAGNOSIS — M79671 Pain in right foot: Secondary | ICD-10-CM | POA: Diagnosis not present

## 2022-05-22 DIAGNOSIS — R29898 Other symptoms and signs involving the musculoskeletal system: Secondary | ICD-10-CM | POA: Diagnosis not present

## 2022-05-22 DIAGNOSIS — N184 Chronic kidney disease, stage 4 (severe): Secondary | ICD-10-CM | POA: Diagnosis not present

## 2022-05-22 DIAGNOSIS — M6281 Muscle weakness (generalized): Secondary | ICD-10-CM | POA: Diagnosis not present

## 2022-05-22 DIAGNOSIS — S73005D Unspecified dislocation of left hip, subsequent encounter: Secondary | ICD-10-CM | POA: Diagnosis not present

## 2022-05-23 ENCOUNTER — Ambulatory Visit: Payer: Medicare PPO | Admitting: Internal Medicine

## 2022-05-23 ENCOUNTER — Encounter: Payer: Self-pay | Admitting: Internal Medicine

## 2022-05-23 VITALS — BP 118/70 | HR 57 | Temp 97.6°F | Ht 63.0 in | Wt 140.0 lb

## 2022-05-23 DIAGNOSIS — R5383 Other fatigue: Secondary | ICD-10-CM

## 2022-05-23 DIAGNOSIS — E039 Hypothyroidism, unspecified: Secondary | ICD-10-CM | POA: Diagnosis not present

## 2022-05-23 DIAGNOSIS — I2699 Other pulmonary embolism without acute cor pulmonale: Secondary | ICD-10-CM | POA: Diagnosis not present

## 2022-05-23 DIAGNOSIS — R0609 Other forms of dyspnea: Secondary | ICD-10-CM | POA: Diagnosis not present

## 2022-05-23 DIAGNOSIS — I10 Essential (primary) hypertension: Secondary | ICD-10-CM

## 2022-05-23 DIAGNOSIS — D649 Anemia, unspecified: Secondary | ICD-10-CM

## 2022-05-23 DIAGNOSIS — K922 Gastrointestinal hemorrhage, unspecified: Secondary | ICD-10-CM

## 2022-05-23 DIAGNOSIS — N184 Chronic kidney disease, stage 4 (severe): Secondary | ICD-10-CM

## 2022-05-23 DIAGNOSIS — R531 Weakness: Secondary | ICD-10-CM | POA: Insufficient documentation

## 2022-05-23 LAB — COMPREHENSIVE METABOLIC PANEL
ALT: 11 U/L (ref 0–35)
AST: 18 U/L (ref 0–37)
Albumin: 3.9 g/dL (ref 3.5–5.2)
Alkaline Phosphatase: 73 U/L (ref 39–117)
BUN: 70 mg/dL — ABNORMAL HIGH (ref 6–23)
CO2: 28 mEq/L (ref 19–32)
Calcium: 10.4 mg/dL (ref 8.4–10.5)
Chloride: 100 mEq/L (ref 96–112)
Creatinine, Ser: 2.37 mg/dL — ABNORMAL HIGH (ref 0.40–1.20)
GFR: 18.45 mL/min — ABNORMAL LOW (ref >60.00)
Glucose, Bld: 117 mg/dL — ABNORMAL HIGH (ref 70–99)
Potassium: 4.7 mEq/L (ref 3.5–5.1)
Sodium: 139 mEq/L (ref 135–145)
Total Bilirubin: 0.5 mg/dL (ref 0.2–1.2)
Total Protein: 6.9 g/dL (ref 6.0–8.3)

## 2022-05-23 LAB — CBC WITH DIFFERENTIAL/PLATELET
Basophils Absolute: 0.3 10*3/uL — ABNORMAL HIGH (ref 0.0–0.1)
Basophils Relative: 3.4 % — ABNORMAL HIGH (ref 0.0–3.0)
Eosinophils Absolute: 0.4 10*3/uL (ref 0.0–0.7)
Eosinophils Relative: 5.4 % — ABNORMAL HIGH (ref 0.0–5.0)
HCT: 38.2 % (ref 36.0–46.0)
Hemoglobin: 12.3 g/dL (ref 12.0–15.0)
Lymphocytes Relative: 17.6 % (ref 12.0–46.0)
Lymphs Abs: 1.5 10*3/uL (ref 0.7–4.0)
MCHC: 32.3 g/dL (ref 30.0–36.0)
MCV: 94.3 fl (ref 78.0–100.0)
Monocytes Absolute: 1.1 10*3/uL — ABNORMAL HIGH (ref 0.1–1.0)
Monocytes Relative: 13.4 % — ABNORMAL HIGH (ref 3.0–12.0)
Neutro Abs: 5 10*3/uL (ref 1.4–7.7)
Neutrophils Relative %: 60.2 % (ref 43.0–77.0)
Platelets: 299 10*3/uL (ref 150.0–400.0)
RBC: 4.05 Mil/uL (ref 3.87–5.11)
RDW: 18.2 % — ABNORMAL HIGH (ref 11.5–15.5)
WBC: 8.3 10*3/uL (ref 4.0–10.5)

## 2022-05-23 LAB — T4, FREE: Free T4: 0.67 ng/dL (ref 0.60–1.60)

## 2022-05-23 MED ORDER — CARVEDILOL 3.125 MG PO TABS: 3.1250 mg | ORAL_TABLET | Freq: Two times a day (BID) | ORAL | 3 refills | Status: DC

## 2022-05-23 NOTE — Progress Notes (Signed)
Subjective:  Patient ID: [name], female    DOB: 16-Jul-1940  Age: 82 y.o. MRN: [id number]  CC: No chief complaint on file.   HPI BROOKLYNNE PEREIDA presents for a fall on Sat (no LOC). BP was low. SBP was in the 80s.Marland KitchenMarland KitchenPt stopped Torsemide on Sun. C/o fatigue - taking 2 naps a day...  Outpatient Medications Prior to Visit  Medication Sig Dispense Refill   acetaminophen (TYLENOL) 325 MG tablet Take 2 tablets (650 mg total) by mouth 2 (two) times daily as needed (generalized pain). 30 tablet 2   albuterol (VENTOLIN HFA) 108 (90 Base) MCG/ACT inhaler Inhale 1-2 puffs into the lungs every 4 (four) hours as needed for wheezing or shortness of breath. 1 each 2   apixaban (ELIQUIS) 2.5 MG TABS tablet Take 1 tablet (2.5 mg total) by mouth 2 (two) times daily. 180 tablet 1   ascorbic acid (VITAMIN C) 500 MG tablet Take 1 tablet (500 mg total) by mouth daily. 30 tablet 2   atorvastatin (LIPITOR) 80 MG tablet Take 1 tablet (80 mg total) by mouth daily. 30 tablet 2   Cholecalciferol (VITAMIN D3) 50 MCG (2000 UT) capsule Take 1 capsule (2,000 Units total) by mouth daily. 100 capsule 3   Cyanocobalamin 2500 MCG TABS Take 2,500 mcg by mouth daily. 30 tablet 2   docusate sodium (COLACE) 100 MG capsule Take 1-2 capsules (100-200 mg total) by mouth daily as needed for mild constipation. 100 capsule 3   gabapentin (NEURONTIN) 300 MG capsule Take 1 capsule (300 mg total) by mouth at bedtime.     Golimumab 50 MG/0.5ML SOAJ Take 50 mg by mouth every 2 (two) months. Hold for the next 2 weeks per Orthopedic surgery recommendations. 0.3 mL    leflunomide (ARAVA) 20 MG tablet Take 1 tablet (20 mg total) by mouth daily. 30 tablet 0   levothyroxine (SYNTHROID) 88 MCG tablet Take 1 tablet (88 mcg total) by mouth daily before breakfast. 90 tablet 3   Multiple Vitamins-Minerals (CENTRUM ADULT PO) Take 1 tablet by mouth daily. 1 Tablet Daily.     polyethylene glycol (MIRALAX / GLYCOLAX) 17 g packet Take 17 g by mouth  daily as needed. 14 each 0   Tiotropium Bromide-Olodaterol (STIOLTO RESPIMAT) 2.5-2.5 MCG/ACT AERS Inhale 2 each into the lungs at bedtime. 30 each 2   torsemide (DEMADEX) 20 MG tablet TAKE 1 TO 2 TABLETS BY MOUTH DAILY AS NEEDED 60 tablet 11   triamcinolone (NASACORT) 55 MCG/ACT AERO nasal inhaler Place 1 spray into the nose daily. 1 each 3   carvedilol (COREG) 6.25 MG tablet Take 1 tablet (6.25 mg total) by mouth 2 (two) times daily with a meal. 180 tablet 3   No facility-administered medications prior to visit.    ROS: Review of Systems  Constitutional:  Positive for fatigue. Negative for activity change, appetite change, chills and unexpected weight change.  HENT:  Negative for congestion, mouth sores and sinus pressure.   Eyes:  Negative for visual disturbance.  Respiratory:  Negative for cough and chest tightness.   Gastrointestinal:  Negative for abdominal pain and nausea.  Genitourinary:  Negative for difficulty urinating, frequency and vaginal pain.  Musculoskeletal:  Negative for back pain and gait problem.  Skin:  Negative for pallor and rash.  Neurological:  Positive for weakness and light-headedness. Negative for dizziness, tremors, numbness and headaches.  Psychiatric/Behavioral:  Negative for confusion and sleep disturbance.     Objective:  BP 118/70 (BP Location: Left Arm,  Patient Position: Sitting, Cuff Size: Normal)   Pulse (!) 57   Temp 97.6 F (36.4 C) (Oral)   Ht '5\' 3"'$  (1.6 m)   Wt 140 lb (63.5 kg)   SpO2 94%   BMI 24.80 kg/m   BP Readings from Last 3 Encounters:  05/23/22 118/70  05/17/22 (!) 161/77  05/04/22 119/64    Wt Readings from Last 3 Encounters:  05/23/22 140 lb (63.5 kg)  05/17/22 140 lb (63.5 kg)  03/30/22 154 lb (69.9 kg)    Physical Exam Constitutional:      General: She is not in acute distress.    Appearance: She is well-developed. She is obese.  HENT:     Head: Normocephalic.     Right Ear: External ear normal.     Left Ear:  External ear normal.     Nose: Nose normal.  Eyes:     General:        Right eye: No discharge.        Left eye: No discharge.     Conjunctiva/sclera: Conjunctivae normal.     Pupils: Pupils are equal, round, and reactive to light.  Neck:     Thyroid: No thyromegaly.     Vascular: No JVD.     Trachea: No tracheal deviation.  Cardiovascular:     Rate and Rhythm: Normal rate and regular rhythm.     Heart sounds: Normal heart sounds.  Pulmonary:     Effort: No respiratory distress.     Breath sounds: No stridor. No wheezing.  Abdominal:     General: Bowel sounds are normal. There is no distension.     Palpations: Abdomen is soft. There is no mass.     Tenderness: There is no abdominal tenderness. There is no guarding or rebound.  Musculoskeletal:        General: No tenderness.     Cervical back: Normal range of motion and neck supple. No rigidity.     Right lower leg: No edema.     Left lower leg: No edema.  Lymphadenopathy:     Cervical: No cervical adenopathy.  Skin:    Findings: No erythema or rash.  Neurological:     Mental Status: She is oriented to person, place, and time.     Cranial Nerves: No cranial nerve deficit.     Motor: No abnormal muscle tone.     Coordination: Coordination normal.     Gait: Gait abnormal.     Deep Tendon Reflexes: Reflexes normal.  Psychiatric:        Behavior: Behavior normal.        Thought Content: Thought content normal.        Judgment: Judgment normal.    Using a walker   Lab Results  Component Value Date   WBC 9.2 03/20/2022   HGB 9.6 (L) 05/04/2022   HCT 30 (A) 03/20/2022   PLT 451 (A) 03/20/2022   GLUCOSE 109 (H) 03/09/2022   CHOL 157 09/07/2020   TRIG 121.0 09/07/2020   HDL 59.30 09/07/2020   LDLDIRECT 162.1 10/28/2009   LDLCALC 74 09/07/2020   ALT 19 02/16/2022   AST 25 02/16/2022   NA 138 03/09/2022   K 3.4 (L) 03/09/2022   CL 100 03/09/2022   CREATININE 2.55 (H) 03/09/2022   BUN 44 (H) 03/09/2022   CO2 27  03/09/2022   TSH 0.985 02/16/2022   INR 1.3 (H) 02/16/2022    No results found.  Assessment & Plan:  Problem List Items Addressed This Visit       Cardiovascular and Mediastinum   Essential hypertension - Primary (Chronic)    Low BP Reduce Coreg to 3.125 mg bid. Demadex is on hold      Relevant Medications   carvedilol (COREG) 3.125 MG tablet     Digestive   Lower GI bleeding    Remote Check CBC        Other   Fatigue    Low BP Reduce Coreg to 3.125 mg bid. Demadex is on hold Check CBC, CMET         Meds ordered this encounter  Medications   carvedilol (COREG) 3.125 MG tablet    Sig: Take 1 tablet (3.125 mg total) by mouth 2 (two) times daily with a meal.    Dispense:  180 tablet    Refill:  3      Follow-up: Return in about 6 weeks (around 07/04/2022) for a follow-up visit.  [name], MD

## 2022-05-23 NOTE — Assessment & Plan Note (Signed)
Low BP Reduce Coreg to 3.125 mg bid. Demadex is on hold

## 2022-05-23 NOTE — Assessment & Plan Note (Signed)
Low BP Reduce Coreg to 3.125 mg bid. Demadex is on hold Check CBC, CMET

## 2022-05-23 NOTE — Assessment & Plan Note (Signed)
Remote Check CBC

## 2022-05-23 NOTE — Patient Instructions (Signed)
  Reduce Coreg to 3.125 mg bid. Demadex is on hold, re-start if retaining water as needed

## 2022-05-24 DIAGNOSIS — N184 Chronic kidney disease, stage 4 (severe): Secondary | ICD-10-CM | POA: Diagnosis not present

## 2022-05-24 DIAGNOSIS — S73005D Unspecified dislocation of left hip, subsequent encounter: Secondary | ICD-10-CM | POA: Diagnosis not present

## 2022-05-24 DIAGNOSIS — R29898 Other symptoms and signs involving the musculoskeletal system: Secondary | ICD-10-CM | POA: Diagnosis not present

## 2022-05-24 DIAGNOSIS — M6281 Muscle weakness (generalized): Secondary | ICD-10-CM | POA: Diagnosis not present

## 2022-05-24 LAB — IRON,TIBC AND FERRITIN PANEL
%SAT: 53 % (calc) — ABNORMAL HIGH (ref 16–45)
Ferritin: 931 ng/mL — ABNORMAL HIGH (ref 16–288)
Iron: 153 ug/dL (ref 45–160)
TIBC: 286 mcg/dL (calc) (ref 250–450)

## 2022-05-26 DIAGNOSIS — M6281 Muscle weakness (generalized): Secondary | ICD-10-CM | POA: Diagnosis not present

## 2022-05-26 DIAGNOSIS — R29898 Other symptoms and signs involving the musculoskeletal system: Secondary | ICD-10-CM | POA: Diagnosis not present

## 2022-05-26 DIAGNOSIS — S73005D Unspecified dislocation of left hip, subsequent encounter: Secondary | ICD-10-CM | POA: Diagnosis not present

## 2022-05-26 DIAGNOSIS — N184 Chronic kidney disease, stage 4 (severe): Secondary | ICD-10-CM | POA: Diagnosis not present

## 2022-05-29 ENCOUNTER — Telehealth: Payer: Self-pay | Admitting: Internal Medicine

## 2022-05-29 DIAGNOSIS — S73005D Unspecified dislocation of left hip, subsequent encounter: Secondary | ICD-10-CM | POA: Diagnosis not present

## 2022-05-29 DIAGNOSIS — M6281 Muscle weakness (generalized): Secondary | ICD-10-CM | POA: Diagnosis not present

## 2022-05-29 DIAGNOSIS — R29898 Other symptoms and signs involving the musculoskeletal system: Secondary | ICD-10-CM | POA: Diagnosis not present

## 2022-05-29 DIAGNOSIS — N184 Chronic kidney disease, stage 4 (severe): Secondary | ICD-10-CM | POA: Diagnosis not present

## 2022-05-29 NOTE — Telephone Encounter (Signed)
FAXED LABS TO DR. FOSTER @ [place].[name]/LMB

## 2022-05-29 NOTE — Telephone Encounter (Signed)
Fax number was incorrect. Refaxed to [phone]../l;mb

## 2022-05-29 NOTE — Telephone Encounter (Signed)
Patient called and would like lab test results sent to Dr. Royce Macadamia at [facility]

## 2022-05-30 DIAGNOSIS — R350 Frequency of micturition: Secondary | ICD-10-CM | POA: Diagnosis not present

## 2022-05-31 DIAGNOSIS — M810 Age-related osteoporosis without current pathological fracture: Secondary | ICD-10-CM | POA: Diagnosis not present

## 2022-05-31 DIAGNOSIS — Z01419 Encounter for gynecological examination (general) (routine) without abnormal findings: Secondary | ICD-10-CM | POA: Diagnosis not present

## 2022-05-31 DIAGNOSIS — M6281 Muscle weakness (generalized): Secondary | ICD-10-CM | POA: Diagnosis not present

## 2022-05-31 DIAGNOSIS — Z124 Encounter for screening for malignant neoplasm of cervix: Secondary | ICD-10-CM | POA: Diagnosis not present

## 2022-05-31 DIAGNOSIS — S73005D Unspecified dislocation of left hip, subsequent encounter: Secondary | ICD-10-CM | POA: Diagnosis not present

## 2022-05-31 DIAGNOSIS — Z6823 Body mass index (BMI) 23.0-23.9, adult: Secondary | ICD-10-CM | POA: Diagnosis not present

## 2022-05-31 DIAGNOSIS — R29898 Other symptoms and signs involving the musculoskeletal system: Secondary | ICD-10-CM | POA: Diagnosis not present

## 2022-05-31 DIAGNOSIS — Z01411 Encounter for gynecological examination (general) (routine) with abnormal findings: Secondary | ICD-10-CM | POA: Diagnosis not present

## 2022-05-31 DIAGNOSIS — N184 Chronic kidney disease, stage 4 (severe): Secondary | ICD-10-CM | POA: Diagnosis not present

## 2022-06-01 ENCOUNTER — Encounter (HOSPITAL_COMMUNITY)
Admission: RE | Admit: 2022-06-01 | Discharge: 2022-06-01 | Disposition: A | Discharge status: Home / Self Care | Payer: Medicare PPO | Source: Ambulatory Visit | Attending: Nephrology | Admitting: Nephrology

## 2022-06-01 VITALS — BP 140/73 | HR 88 | Temp 97.2°F | Resp 18

## 2022-06-01 DIAGNOSIS — N184 Chronic kidney disease, stage 4 (severe): Secondary | ICD-10-CM | POA: Insufficient documentation

## 2022-06-01 LAB — FERRITIN: Ferritin: 368 ng/mL — ABNORMAL HIGH (ref 11–307)

## 2022-06-01 LAB — IRON AND TIBC
Iron: 128 ug/dL (ref 28–170)
Saturation Ratios: 48 % — ABNORMAL HIGH (ref 10.4–31.8)
TIBC: 269 ug/dL (ref 250–450)
UIBC: 141 ug/dL

## 2022-06-01 MED ORDER — EPOETIN ALFA-EPBX 10000 UNIT/ML IJ SOLN
INTRAMUSCULAR | Status: AC
  Filled 2022-06-01: qty 1

## 2022-06-01 MED ORDER — EPOETIN ALFA-EPBX 10000 UNIT/ML IJ SOLN: 10000.0000 [IU] | INTRAMUSCULAR | Status: DC

## 2022-06-02 LAB — POCT HEMOGLOBIN-HEMACUE: Hemoglobin: 12 g/dL (ref 12.0–15.0)

## 2022-06-05 DIAGNOSIS — M6281 Muscle weakness (generalized): Secondary | ICD-10-CM | POA: Diagnosis not present

## 2022-06-05 DIAGNOSIS — R29898 Other symptoms and signs involving the musculoskeletal system: Secondary | ICD-10-CM | POA: Diagnosis not present

## 2022-06-05 DIAGNOSIS — Z79899 Other long term (current) drug therapy: Secondary | ICD-10-CM | POA: Diagnosis not present

## 2022-06-05 DIAGNOSIS — M0579 Rheumatoid arthritis with rheumatoid factor of multiple sites without organ or systems involvement: Secondary | ICD-10-CM | POA: Diagnosis not present

## 2022-06-05 DIAGNOSIS — N184 Chronic kidney disease, stage 4 (severe): Secondary | ICD-10-CM | POA: Diagnosis not present

## 2022-06-05 DIAGNOSIS — S73005D Unspecified dislocation of left hip, subsequent encounter: Secondary | ICD-10-CM | POA: Diagnosis not present

## 2022-06-06 DIAGNOSIS — R350 Frequency of micturition: Secondary | ICD-10-CM | POA: Diagnosis not present

## 2022-06-07 DIAGNOSIS — M6281 Muscle weakness (generalized): Secondary | ICD-10-CM | POA: Diagnosis not present

## 2022-06-07 DIAGNOSIS — R29898 Other symptoms and signs involving the musculoskeletal system: Secondary | ICD-10-CM | POA: Diagnosis not present

## 2022-06-07 DIAGNOSIS — N184 Chronic kidney disease, stage 4 (severe): Secondary | ICD-10-CM | POA: Diagnosis not present

## 2022-06-07 DIAGNOSIS — S73005D Unspecified dislocation of left hip, subsequent encounter: Secondary | ICD-10-CM | POA: Diagnosis not present

## 2022-06-09 ENCOUNTER — Telehealth: Payer: Self-pay | Admitting: *Deleted

## 2022-06-09 ENCOUNTER — Telehealth: Payer: Self-pay | Admitting: Orthopaedic Surgery

## 2022-06-09 DIAGNOSIS — S73005D Unspecified dislocation of left hip, subsequent encounter: Secondary | ICD-10-CM | POA: Diagnosis not present

## 2022-06-09 DIAGNOSIS — R29898 Other symptoms and signs involving the musculoskeletal system: Secondary | ICD-10-CM | POA: Diagnosis not present

## 2022-06-09 DIAGNOSIS — N184 Chronic kidney disease, stage 4 (severe): Secondary | ICD-10-CM | POA: Diagnosis not present

## 2022-06-09 DIAGNOSIS — M6281 Muscle weakness (generalized): Secondary | ICD-10-CM | POA: Diagnosis not present

## 2022-06-09 NOTE — Progress Notes (Signed)
  Care Coordination Note  06/09/2022 Name: [name] MRN: [id number] DOB: [birth year]  [name] is a 82 y.o. year old female who is a primary care patient of Plotnikov, [name], MD and is actively engaged with the care management team. I reached out to Barbaraann Boys by phone today to assist with re-scheduling an initial visit with the RN Case Manager  Follow up plan: Telephone appointment with care management team member scheduled for: 07/12/2022  Julian Hy, [place] Direct Dial: [phone]

## 2022-06-09 NOTE — Telephone Encounter (Signed)
Patient called an confirmed change in her appointment

## 2022-06-13 DIAGNOSIS — R350 Frequency of micturition: Secondary | ICD-10-CM | POA: Diagnosis not present

## 2022-06-14 DIAGNOSIS — I2510 Atherosclerotic heart disease of native coronary artery without angina pectoris: Secondary | ICD-10-CM | POA: Diagnosis not present

## 2022-06-14 DIAGNOSIS — Z9889 Other specified postprocedural states: Secondary | ICD-10-CM | POA: Diagnosis not present

## 2022-06-14 DIAGNOSIS — K922 Gastrointestinal hemorrhage, unspecified: Secondary | ICD-10-CM | POA: Diagnosis not present

## 2022-06-14 DIAGNOSIS — S73005D Unspecified dislocation of left hip, subsequent encounter: Secondary | ICD-10-CM | POA: Diagnosis not present

## 2022-06-14 DIAGNOSIS — M6281 Muscle weakness (generalized): Secondary | ICD-10-CM | POA: Diagnosis not present

## 2022-06-14 DIAGNOSIS — D631 Anemia in chronic kidney disease: Secondary | ICD-10-CM | POA: Diagnosis not present

## 2022-06-14 DIAGNOSIS — I129 Hypertensive chronic kidney disease with stage 1 through stage 4 chronic kidney disease, or unspecified chronic kidney disease: Secondary | ICD-10-CM | POA: Diagnosis not present

## 2022-06-14 DIAGNOSIS — N184 Chronic kidney disease, stage 4 (severe): Secondary | ICD-10-CM | POA: Diagnosis not present

## 2022-06-14 DIAGNOSIS — N179 Acute kidney failure, unspecified: Secondary | ICD-10-CM | POA: Diagnosis not present

## 2022-06-14 DIAGNOSIS — E875 Hyperkalemia: Secondary | ICD-10-CM | POA: Diagnosis not present

## 2022-06-14 DIAGNOSIS — R29898 Other symptoms and signs involving the musculoskeletal system: Secondary | ICD-10-CM | POA: Diagnosis not present

## 2022-06-15 ENCOUNTER — Ambulatory Visit (INDEPENDENT_AMBULATORY_CARE_PROVIDER_SITE_OTHER): Payer: Medicare PPO

## 2022-06-15 ENCOUNTER — Other Ambulatory Visit: Payer: Medicare PPO

## 2022-06-15 VITALS — Ht 63.0 in | Wt 145.0 lb

## 2022-06-15 DIAGNOSIS — Z0000 Encounter for general adult medical examination without abnormal findings: Secondary | ICD-10-CM

## 2022-06-15 LAB — LAB REPORT - SCANNED: EGFR: 19

## 2022-06-15 NOTE — Patient Instructions (Signed)
[name] , Thank you for taking time to come for your Medicare Wellness Visit. I appreciate your ongoing commitment to your health goals. Please review the following plan we discussed and let me know if I can assist you in the future.   These are the goals we discussed:  Goals       exercise (pt-stated)      Would like to exercise more than she does; Moderate pool exercise 3 times a week Would like to start walking/ Coached on ways to stay motivated to walk; Takes stairs 3 flights down and down to basement; Will work on going up      [employer] current health status      Continue eat healthy, exercise more by walking more routinely, enjoy life and family        This is a list of the screening recommended for you and due dates:  Health Maintenance  Topic Date Due   Colon Cancer Screening  03/30/2019   COVID-19 Vaccine (6 - 2023-24 season) 08/04/2022   Medicare Annual Wellness Visit  06/16/2023   DTaP/Tdap/Td vaccine (3 - Td or Tdap) 06/01/2026   Pneumonia Vaccine  Completed   Flu Shot  Completed   DEXA scan (bone density measurement)  Completed   Zoster (Shingles) Vaccine  Completed   HPV Vaccine  Aged Out    Advanced directives: yes  Conditions/risks identified: low falls risk  Next appointment: Follow up in one year for your annual wellness visit 06/18/2023 @3$ :30pm telephone   Preventive Care 65 Years and Older, Female Preventive care refers to lifestyle choices and visits with your health care provider that can promote health and wellness. What does preventive care include? A yearly physical exam. This is also called an annual well check. Dental exams once or twice a year. Routine eye exams. Ask your health care provider how often you should have your eyes checked. Personal lifestyle choices, including: Daily care of your teeth and gums. Regular physical activity. Eating a healthy diet. Avoiding tobacco and drug use. Limiting alcohol use. Practicing safe  sex. Taking low-dose aspirin every day. Taking vitamin and mineral supplements as recommended by your health care provider. What happens during an annual well check? The services and screenings done by your health care provider during your annual well check will depend on your age, overall health, lifestyle risk factors, and family history of disease. Counseling  Your health care provider may ask you questions about your: Alcohol use. Tobacco use. Drug use. Emotional well-being. Home and relationship well-being. Sexual activity. Eating habits. History of falls. Memory and ability to understand (cognition). Work and work Statistician. Reproductive health. Screening  You may have the following tests or measurements: Height, weight, and BMI. Blood pressure. Lipid and cholesterol levels. These may be checked every 5 years, or more frequently if you are over 45 years old. Skin check. Lung cancer screening. You may have this screening every year starting at age 46 if you have a 30-pack-year history of smoking and currently smoke or have quit within the past 15 years. Fecal occult blood test (FOBT) of the stool. You may have this test every year starting at age 11. Flexible sigmoidoscopy or colonoscopy. You may have a sigmoidoscopy every 5 years or a colonoscopy every 10 years starting at age 92. Hepatitis C blood test. Hepatitis B blood test. Sexually transmitted disease (STD) testing. Diabetes screening. This is done by checking your blood sugar (glucose) after you have not eaten for a while (fasting).  You may have this done every 1-3 years. Bone density scan. This is done to screen for osteoporosis. You may have this done starting at age 48. Mammogram. This may be done every 1-2 years. Talk to your health care provider about how often you should have regular mammograms. Talk with your health care provider about your test results, treatment options, and if necessary, the need for more  tests. Vaccines  Your health care provider may recommend certain vaccines, such as: Influenza vaccine. This is recommended every year. Tetanus, diphtheria, and acellular pertussis (Tdap, Td) vaccine. You may need a Td booster every 10 years. Zoster vaccine. You may need this after age 10. Pneumococcal 13-valent conjugate (PCV13) vaccine. One dose is recommended after age 40. Pneumococcal polysaccharide (PPSV23) vaccine. One dose is recommended after age 8. Talk to your health care provider about which screenings and vaccines you need and how often you need them. This information is not intended to replace advice given to you by your health care provider. Make sure you discuss any questions you have with your health care provider. Document Released: 05/14/2015 Document Revised: 01/05/2016 Document Reviewed: 02/16/2015 Elsevier Interactive Patient Education  2017 [place] Prevention in the Home Falls can cause injuries. They can happen to people of all ages. There are many things you can do to make your home safe and to help prevent falls. What can I do on the outside of my home? Regularly fix the edges of walkways and driveways and fix any cracks. Remove anything that might make you trip as you walk through a door, such as a raised step or threshold. Trim any bushes or trees on the path to your home. Use bright outdoor lighting. Clear any walking paths of anything that might make someone trip, such as rocks or tools. Regularly check to see if handrails are loose or broken. Make sure that both sides of any steps have handrails. Any raised decks and porches should have guardrails on the edges. Have any leaves, snow, or ice cleared regularly. Use sand or salt on walking paths during winter. Clean up any spills in your garage right away. This includes oil or grease spills. What can I do in the bathroom? Use night lights. Install grab bars by the toilet and in the tub and shower.  Do not use towel bars as grab bars. Use non-skid mats or decals in the tub or shower. If you need to sit down in the shower, use a plastic, non-slip stool. Keep the floor dry. Clean up any water that spills on the floor as soon as it happens. Remove soap buildup in the tub or shower regularly. Attach bath mats securely with double-sided non-slip rug tape. Do not have throw rugs and other things on the floor that can make you trip. What can I do in the bedroom? Use night lights. Make sure that you have a light by your bed that is easy to reach. Do not use any sheets or blankets that are too big for your bed. They should not hang down onto the floor. Have a firm chair that has side arms. You can use this for support while you get dressed. Do not have throw rugs and other things on the floor that can make you trip. What can I do in the kitchen? Clean up any spills right away. Avoid walking on wet floors. Keep items that you use a lot in easy-to-reach places. If you need to reach something above you, use a  strong step stool that has a grab bar. Keep electrical cords out of the way. Do not use floor polish or wax that makes floors slippery. If you must use wax, use non-skid floor wax. Do not have throw rugs and other things on the floor that can make you trip. What can I do with my stairs? Do not leave any items on the stairs. Make sure that there are handrails on both sides of the stairs and use them. Fix handrails that are broken or loose. Make sure that handrails are as long as the stairways. Check any carpeting to make sure that it is firmly attached to the stairs. Fix any carpet that is loose or worn. Avoid having throw rugs at the top or bottom of the stairs. If you do have throw rugs, attach them to the floor with carpet tape. Make sure that you have a light switch at the top of the stairs and the bottom of the stairs. If you do not have them, ask someone to add them for you. What else  can I do to help prevent falls? Wear shoes that: Do not have high heels. Have rubber bottoms. Are comfortable and fit you well. Are closed at the toe. Do not wear sandals. If you use a stepladder: Make sure that it is fully opened. Do not climb a closed stepladder. Make sure that both sides of the stepladder are locked into place. Ask someone to hold it for you, if possible. Clearly mark and make sure that you can see: Any grab bars or handrails. First and last steps. Where the edge of each step is. Use tools that help you move around (mobility aids) if they are needed. These include: Canes. Walkers. Scooters. Crutches. Turn on the lights when you go into a dark area. Replace any light bulbs as soon as they burn out. Set up your furniture so you have a clear path. Avoid moving your furniture around. If any of your floors are uneven, fix them. If there are any pets around you, be aware of where they are. Review your medicines with your doctor. Some medicines can make you feel dizzy. This can increase your chance of falling. Ask your doctor what other things that you can do to help prevent falls. This information is not intended to replace advice given to you by your health care provider. Make sure you discuss any questions you have with your health care provider. Document Released: 02/11/2009 Document Revised: 09/23/2015 Document Reviewed: 05/22/2014 Elsevier Interactive Patient Education  2017 [employer].

## 2022-06-15 NOTE — Progress Notes (Addendum)
I connected with  [name] on 06/15/22 by a audio enabled telemedicine application and verified that I am speaking with the correct person using two identifiers.  Patient Location: Home  Provider Location: Office/Clinic  I discussed the limitations of evaluation and management by telemedicine. The patient expressed understanding and agreed to proceed.  Subjective:   [name] is a 82 y.o. female who presents for Medicare Annual (Subsequent) preventive examination.  Review of Systems          Objective:    Today's Vitals   06/15/22 1407  Weight: 145 lb (65.8 kg)  Height: '5\' 3"'$  (1.6 m)   Body mass index is 25.69 kg/m.     06/15/2022    2:15 PM 03/21/2022    3:43 PM 03/10/2022   11:15 AM 03/06/2022    1:18 AM 02/07/2022    8:13 AM 01/31/2022    9:50 AM 01/23/2022    7:43 AM  Advanced Directives  Does Patient Have a Medical Advance Directive? Yes No No No No No No  Type of Paramedic of [place];Living will        Copy of [place] in Chart? No - copy requested        Would patient like information on creating a medical advance directive?  No - Patient declined No - Patient declined No - Patient declined   No - Patient declined    Current Medications (verified) Outpatient Encounter Medications as of 06/15/2022  Medication Sig   acetaminophen (TYLENOL) 325 MG tablet Take 2 tablets (650 mg total) by mouth 2 (two) times daily as needed (generalized pain).   albuterol (VENTOLIN HFA) 108 (90 Base) MCG/ACT inhaler Inhale 1-2 puffs into the lungs every 4 (four) hours as needed for wheezing or shortness of breath.   apixaban (ELIQUIS) 2.5 MG TABS tablet Take 1 tablet (2.5 mg total) by mouth 2 (two) times daily.   ascorbic acid (VITAMIN C) 500 MG tablet Take 1 tablet (500 mg total) by mouth daily.   atorvastatin (LIPITOR) 80 MG tablet Take 1 tablet (80 mg total) by mouth daily.   carvedilol (COREG) 3.125 MG tablet Take 1 tablet  (3.125 mg total) by mouth 2 (two) times daily with a meal.   Cholecalciferol (VITAMIN D3) 50 MCG (2000 UT) capsule Take 1 capsule (2,000 Units total) by mouth daily.   Cyanocobalamin 2500 MCG TABS Take 2,500 mcg by mouth daily.   docusate sodium (COLACE) 100 MG capsule Take 1-2 capsules (100-200 mg total) by mouth daily as needed for mild constipation.   gabapentin (NEURONTIN) 300 MG capsule Take 1 capsule (300 mg total) by mouth at bedtime.   Golimumab 50 MG/0.5ML SOAJ Take 50 mg by mouth every 2 (two) months. Hold for the next 2 weeks per Orthopedic surgery recommendations.   leflunomide (ARAVA) 20 MG tablet Take 1 tablet (20 mg total) by mouth daily.   levothyroxine (SYNTHROID) 88 MCG tablet Take 1 tablet (88 mcg total) by mouth daily before breakfast.   Multiple Vitamins-Minerals (CENTRUM ADULT PO) Take 1 tablet by mouth daily. 1 Tablet Daily.   polyethylene glycol (MIRALAX / GLYCOLAX) 17 g packet Take 17 g by mouth daily as needed.   Tiotropium Bromide-Olodaterol (STIOLTO RESPIMAT) 2.5-2.5 MCG/ACT AERS Inhale 2 each into the lungs at bedtime.   torsemide (DEMADEX) 20 MG tablet TAKE 1 TO 2 TABLETS BY MOUTH DAILY AS NEEDED   triamcinolone (NASACORT) 55 MCG/ACT AERO nasal inhaler Place 1 spray into the  nose daily.   No facility-administered encounter medications on file as of 06/15/2022.    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 Most likely scar. Mild subendocardial ischemia. EF 71% LOW RISK.    Cervical disc disease    fracture   Chronic kidney disease    COPD (chronic obstructive pulmonary disease) (HCC)    Diverticulosis    DVT (deep venous thrombosis) ([place])    RLE; Sept '23   History of pulmonary embolus (PE)    Oct '23   Hyperlipidemia    Hypertension    Hypothyroidism (acquired)    hypo   Myocardial infarction ([place]) 11/2014   TIA   Polymyalgia rheumatica ([place])    2011 Dr. Charlestine Night   Rheumatoid arthritis [facility]) 2011   Dr.Truslow; fracture knees, hands and wrists -    S/P CABG x 1 1996   CABG--LIMA-LAD for ostial LAD (not felt to be PCI amenable). EF NORMAL then; LIMA now atretic   Shortness of breath dyspnea    with exertion   Stroke ([place]) 11/2014   TIA    Urinary frequency    Past Surgical History:  Procedure Laterality Date   ABDOMINAL AORTIC ANEURYSM REPAIR  [id number]   Complicated by mesenteric artery stenosis and splenic artery infarction with acquired Asplenia   APPENDECTOMY     BUNIONECTOMY  07/2011   right foot   CARDIAC CATHETERIZATION  2005   (Most recent CATH) - ostial LAD lesion 20-30% (down from 90% initially). Atretic LIMA. Minimal disease the RCA and Circumflex system.   CARPAL TUNNEL RELEASE Left    CATARACT EXTRACTION Bilateral    CERVICAL SPINE SURGERY     plate 2008 Dr. Saintclair Halsted   COLONOSCOPY     CORONARY ARTERY BYPASS GRAFT  1996   INCLUDED AN INTERNAL MAMMARY ARTERY TO THE LAD. EF WAS NORMAL   INGUINAL HERNIA REPAIR Right    LAPAROSCOPIC APPENDECTOMY N/A 06/28/2016   Procedure: APPENDECTOMY LAPAROSCOPIC;  Surgeon: Leighton Ruff, MD;  Location: [facility];  Service: General;  Laterality: N/A;   NM MYOVIEW LTD  06/2010   Fixed anteroseptal, apical and inferoapical defect with moderate size. Most likely scar. Mild subendocardial ischemia. EF 71%  LOW RISK.    SPLENECTOMY     TOTAL HIP ARTHROPLASTY Left 01/23/2022   Procedure: LEFT TOTAL HIP ARTHROPLASTY ANTERIOR APPROACH;  Surgeon: Leandrew Koyanagi, MD;  Location: [place];  Service: Orthopedics;  Laterality: Left;  3-C   TRANSTHORACIC ECHOCARDIOGRAM  12/2014   [facility]: Normal LV size & function. EF 55-60%,    vagina polyp     VIDEO ASSISTED THORACOSCOPY (VATS)/WEDGE RESECTION Left 03/02/2015   Procedure: VIDEO ASSISTED THORACOSCOPY (VATS), MINI THORACOTOMY, LEFT UPPER LOBE WEDGE, TAKE DOWN OF  INTERNAL MAMMARY LESIONS, PLACEMENT OF ON-Q PUMP;  Surgeon: Grace Isaac, MD;  Location: [place];  Service: Thoracic;  Laterality: Left;   VIDEO BRONCHOSCOPY N/A 03/02/2015   Procedure: BRONCHOSCOPY;  Surgeon: Grace Isaac, MD;  Location: [place];  Service: Thoracic;  Laterality: N/A;   VIDEO BRONCHOSCOPY WITH ENDOBRONCHIAL NAVIGATION N/A 10/08/2017   Procedure: VIDEO BRONCHOSCOPY WITH ENDOBRONCHIAL NAVIGATION WITH BIOPSIES OF LEFT UPPER LOBE AND LEFT LOWER LOBE;  Surgeon: Grace Isaac, MD;  Location: [place];  Service: Thoracic;  Laterality: N/A;   Family History  Problem Relation Age of Onset   Hypertension Mother    Diabetes Mother    Heart disease Mother    Hyperlipidemia Mother    Stroke Father    Hyperlipidemia Sister    Hypertension Sister    Hypertension Daughter    Cancer Paternal Uncle        Deceased from cancer not sure of site   Hypertension Son    Hyperlipidemia Son    Hyperlipidemia Son    Hypertension Son    Hyperlipidemia Son    Hypertension Son    Hypertension Other    Coronary artery disease Other    Asthma Neg Hx    Colon cancer Neg Hx    Social History   Socioeconomic History   Marital status: Married    Spouse name: Not on file   Number of children: 4   Years of education: Not on file   Highest education level: Not on file  Occupational History   Occupation: retired    Fish farm manager: RETIRED  Tobacco Use   Smoking status: Former     Types: Cigarettes    Quit date: 12/04/1958    Years since quitting: 63.5    Passive exposure: Never   Smokeless tobacco: Never  Vaping Use   Vaping Use: Never used  Substance and Sexual Activity   Alcohol use: Yes    Comment: hardly ever   Drug use: No   Sexual activity: Not Currently  Other Topics Concern   Not on file  Social History Narrative   [facility]  MARRIED -RETIRED ; 4 [place].    Living at [facility] since dec 2015   Social Determinants of Health   Financial Resource Strain: Low Risk  (06/13/2022)   Overall Financial Resource Strain (CARDIA)    Difficulty of Paying Living Expenses: Not hard at all  Food Insecurity: No Food Insecurity (06/13/2022)   Hunger Vital Sign    Worried About Running Out of Food in the Last Year: Never true    Ran Out of Food in the Last Year: Never true  Transportation Needs: No Transportation Needs (06/13/2022)   PRAPARE - Hydrologist (Medical): No    Lack of Transportation (Non-Medical): No  Physical Activity: Inactive (06/13/2022)   Exercise Vital Sign    Days of Exercise per Week: 0 days    Minutes of Exercise per Session: 0 min  Stress: No Stress Concern Present (06/13/2022)   [place]    Feeling of Stress : Only a little  Social Connections: Unknown (06/13/2022)   Social Connection and Isolation Panel [NHANES]    Frequency of Communication with Friends and Family: Once a week    Frequency of Social Gatherings with Friends and Family: Once a week    Attends Religious Services: Not on file    Active Member of  Clubs or Organizations: Yes    Attends Music therapist: More than 4 times per year    Marital Status: Married    Tobacco Counseling Counseling given: Not Answered   Clinical Intake:              How often do you need to have someone help you when you read instructions,  pamphlets, or other written materials from your doctor or pharmacy?: 2 - Rarely  Diabetic?no         Activities of Daily Living    06/15/2022    2:15 PM 06/13/2022   10:42 PM  In your present state of health, do you have any difficulty performing the following activities:  Hearing? 0 0  Vision? 1 1  Comment cannot drive now   Difficulty concentrating or making decisions? 0 1  Walking or climbing stairs? 1 1  Dressing or bathing? 0 0  Doing errands, shopping? 1 0  Preparing Food and eating ? N N  Using the Toilet? N N  In the past six months, have you accidently leaked urine? Y Y  Do you have problems with loss of bowel control? N N  Managing your Medications? N N  Managing your Finances? N N  Housekeeping or managing your Housekeeping? N N    Patient Care Team: Plotnikov, [name], MD as PCP - General (Internal Medicine) Leonie Man, MD as PCP - Cardiology (Cardiology) Leonie Man, MD as Consulting Physician (Cardiology) Gavin Pound, MD as Consulting Physician (Rheumatology) Armbruster, Carlota Raspberry, MD as Consulting Physician (Gastroenterology) Tanda Rockers, MD as Consulting Physician (Pulmonary Disease) Grace Isaac, MD (Inactive) as Consulting Physician (Cardiothoracic Surgery) Aloha Gell, MD as Consulting Physician (Obstetrics and Gynecology) Plotnikov, [name], MD Luretha Rued, RN as [place] any recent Medical Services you may have received from other than [facility] providers in the past year (date may be approximate).     Assessment:   This is a routine wellness examination for [place].  Hearing/Vision screen Hearing Screening - Comments:: Adequate hearing Vision Screening - Comments:: Dr Prudencio Burly double vision problems:cannot drive right now  Dietary issues and exercise activities discussed: Exercise limited by: orthopedic condition(s)   Goals Addressed               This Visit's  Progress     exercise (pt-stated)   On track     Would like to exercise more than she does; Moderate pool exercise 3 times a week Would like to start walking/ Coached on ways to stay motivated to walk; Takes stairs 3 flights down and down to basement; Will work on going up       [place]    06/15/2022    2:10 PM 05/23/2022   10:48 AM 03/30/2022   10:46 AM 05/30/2021   11:25 AM 05/30/2021   11:23 AM 03/15/2021   10:39 AM 01/22/2020   11:01 AM  PHQ 2/9 Scores  PHQ - 2 Score 0 0 0 0 0 0 0  PHQ- 9 Score      0     Fall Risk    06/13/2022   10:42 PM 05/23/2022   10:58 AM 05/23/2022   10:48 AM 03/30/2022   10:46 AM 03/10/2022   11:15 AM  Fall Risk   Falls in the past year? 1 1 0 0 1  Number falls in past yr: 0 0 0 0 0  Injury with Fall? 0 0  0 0  Risk for fall due to :  Impaired balance/gait;History of fall(s) No Fall Risks No Fall Risks History of fall(s);Impaired balance/gait  Follow up  Falls evaluation completed Falls evaluation completed Falls evaluation completed Falls evaluation completed    FALL RISK PREVENTION PERTAINING TO THE HOME:  Any stairs in or around the home? no If so, are there any without handrails? No  Home free of loose throw rugs in walkways, pet beds, electrical cords, etc? Yes  Adequate lighting in your home to reduce risk of falls? Yes   ASSISTIVE DEVICES UTILIZED TO PREVENT FALLS:  Life alert? No  Use of a cane, walker or w/c? Yes cane or walker depending on walking distance Grab bars in the bathroom? Yes  Shower chair or bench in shower? Yes  Elevated toilet seat or a handicapped toilet? Yes   Cognitive Function:    12/21/2016    9:30 AM 11/26/2014    9:22 AM  MMSE - Mini Mental State Exam  Not completed:  Unable to complete  Orientation to time 5   Orientation to Place 5   Registration 3   Attention/ Calculation 5   Recall 2   Language- name 2 objects 2   Language- repeat 1   Language- follow 3 step command 3   Language- read  & follow direction 1   Write a sentence 1   Copy design 1   Total score 29         06/15/2022    2:17 PM 01/22/2020   11:04 AM  6CIT Screen  What Year? 0 points 0 points  What month? 0 points 0 points  What time? 0 points 0 points  Count back from 20 0 points 0 points  Months in reverse 0 points 0 points  Repeat phrase 0 points 0 points  Total Score 0 points 0 points    Immunizations Immunization History  Administered Date(s) Administered   Fluad Quad(high Dose 65+) 02/03/2021, 01/16/2022   Influenza Whole 05/02/2001, 01/27/2008, 01/12/2010, 12/31/2010, 01/25/2012   Influenza, High Dose Seasonal PF 01/14/2014, 02/13/2015, 12/21/2016, 01/09/2018, 12/29/2018   Influenza,inj,Quad PF,6+ Mos 12/23/2015   Influenza-Unspecified 02/03/2013, 01/13/2014, 02/13/2015, 01/20/2020   Meningococcal polysaccharide vaccine (MPSV4) 03/21/2012   Moderna SARS-COV2 Booster Vaccination 03/15/2020   Moderna Sars-Covid-2 Vaccination 06/02/2019, 06/30/2019   PPD Test 11/18/2013   Pfizer Covid-19 Vaccine Bivalent Booster 40yr & up 01/19/2021, 10/12/2021   Pneumococcal Conjugate-13 03/20/2013   Pneumococcal Polysaccharide-23 05/02/2000, 06/23/2010, 02/26/2018   Td 05/01/2006   Tdap 06/01/2016   Zoster Recombinat (Shingrix) 05/16/2017, 07/20/2017   Zoster, Live 01/17/2006    TDAP status: Up to date  Flu Vaccine status: Up to date  Pneumococcal vaccine status: Up to date  Covid-19 vaccine status: Completed vaccines  Qualifies for Shingles Vaccine? Yes   Zostavax completed Yes   Shingrix Completed?: Yes  Screening Tests Health Maintenance  Topic Date Due   COLONOSCOPY (Pts 45-460yrInsurance coverage will need to be confirmed)  03/30/2019   COVID-19 Vaccine (6 - 2023-24 season) 08/04/2022   Medicare Annual Wellness (AWV)  06/16/2023   DTaP/Tdap/Td (3 - Td or Tdap) 06/01/2026   Pneumonia Vaccine 6541Years old  Completed   INFLUENZA VACCINE  Completed   DEXA SCAN  Completed   Zoster  Vaccines- Shingrix  Completed   HPV VACCINES  Aged Out    Health Maintenance  Health Maintenance Due  Topic Date Due   COLONOSCOPY (Pts 45-491yrnsurance coverage will need to be confirmed)  03/30/2019  Colorectal cancer screening: No longer required.   Mammogram status: No longer required due to age.had this year:normal  Bone Density status: Completed yes. Results reflect: Bone density results: OSTEOPOROSIS. Repeat every 5 years.  Lung Cancer Screening: (Low Dose CT Chest recommended if Age 38-80 years, 30 pack-year currently smoking OR have quit w/in 15years.) does not qualify.   Lung Cancer Screening Referral: no  Additional Screening:  Hepatitis C Screening: does not qualify; Completed yes  Vision Screening: Recommended annual ophthalmology exams for early detection of glaucoma and other disorders of the eye. Is the patient up to date with their annual eye exam?  Yes  Who is the provider or what is the name of the office in which the patient attends annual eye exams? Dr Prudencio Burly If pt is not established with a provider, would they like to be referred to a provider to establish care? No .   Dental Screening: Recommended annual dental exams for proper oral hygiene  Community Resource Referral / Chronic Care Management: CRR required this visit?  No   CCM required this visit?  No      Plan:     I have personally reviewed and noted the following in the patient's chart:   Medical and social history Use of alcohol, tobacco or illicit drugs  Current medications and supplements including opioid prescriptions. Patient is not currently taking opioid prescriptions. Functional ability and status Nutritional status Physical activity Advanced directives List of other physicians Hospitalizations, surgeries, and ER visits in previous 12 months Vitals Screenings to include cognitive, depression, and falls Referrals and appointments  In addition, I have reviewed and  discussed with patient certain preventive protocols, quality metrics, and best practice recommendations. A written personalized care plan for preventive services as well as general preventive health recommendations were provided to patient.     Roger Shelter, LPN   [id number]   Nurse Notes: pt continues to recover from hip replacement 12/23. Denies any pain today. Pt also relays she has double vision and cannot drive at this time:is being folloed by her eye provider.  Medical screening examination/treatment/procedure(s) were performed by non-physician practitioner and as supervising physician I was immediately available for consultation/collaboration.  I agree with above. Lew Dawes, MD

## 2022-06-16 ENCOUNTER — Encounter (HOSPITAL_COMMUNITY): Payer: Medicare PPO

## 2022-06-16 DIAGNOSIS — S73005D Unspecified dislocation of left hip, subsequent encounter: Secondary | ICD-10-CM | POA: Diagnosis not present

## 2022-06-16 DIAGNOSIS — M6281 Muscle weakness (generalized): Secondary | ICD-10-CM | POA: Diagnosis not present

## 2022-06-16 DIAGNOSIS — R29898 Other symptoms and signs involving the musculoskeletal system: Secondary | ICD-10-CM | POA: Diagnosis not present

## 2022-06-16 DIAGNOSIS — N184 Chronic kidney disease, stage 4 (severe): Secondary | ICD-10-CM | POA: Diagnosis not present

## 2022-06-19 DIAGNOSIS — N184 Chronic kidney disease, stage 4 (severe): Secondary | ICD-10-CM | POA: Diagnosis not present

## 2022-06-19 DIAGNOSIS — R29898 Other symptoms and signs involving the musculoskeletal system: Secondary | ICD-10-CM | POA: Diagnosis not present

## 2022-06-19 DIAGNOSIS — S73005D Unspecified dislocation of left hip, subsequent encounter: Secondary | ICD-10-CM | POA: Diagnosis not present

## 2022-06-19 DIAGNOSIS — M6281 Muscle weakness (generalized): Secondary | ICD-10-CM | POA: Diagnosis not present

## 2022-06-20 DIAGNOSIS — R350 Frequency of micturition: Secondary | ICD-10-CM | POA: Diagnosis not present

## 2022-06-21 ENCOUNTER — Other Ambulatory Visit: Payer: Self-pay | Admitting: Nurse Practitioner

## 2022-06-21 DIAGNOSIS — S73005D Unspecified dislocation of left hip, subsequent encounter: Secondary | ICD-10-CM | POA: Diagnosis not present

## 2022-06-21 DIAGNOSIS — N184 Chronic kidney disease, stage 4 (severe): Secondary | ICD-10-CM | POA: Diagnosis not present

## 2022-06-21 DIAGNOSIS — M6281 Muscle weakness (generalized): Secondary | ICD-10-CM | POA: Diagnosis not present

## 2022-06-21 DIAGNOSIS — R29898 Other symptoms and signs involving the musculoskeletal system: Secondary | ICD-10-CM | POA: Diagnosis not present

## 2022-06-23 DIAGNOSIS — M6281 Muscle weakness (generalized): Secondary | ICD-10-CM | POA: Diagnosis not present

## 2022-06-23 DIAGNOSIS — R29898 Other symptoms and signs involving the musculoskeletal system: Secondary | ICD-10-CM | POA: Diagnosis not present

## 2022-06-23 DIAGNOSIS — N184 Chronic kidney disease, stage 4 (severe): Secondary | ICD-10-CM | POA: Diagnosis not present

## 2022-06-23 DIAGNOSIS — S73005D Unspecified dislocation of left hip, subsequent encounter: Secondary | ICD-10-CM | POA: Diagnosis not present

## 2022-06-27 DIAGNOSIS — R350 Frequency of micturition: Secondary | ICD-10-CM | POA: Diagnosis not present

## 2022-06-29 ENCOUNTER — Encounter (HOSPITAL_COMMUNITY): Payer: Medicare PPO

## 2022-06-29 ENCOUNTER — Ambulatory Visit: Payer: Medicare PPO | Admitting: Internal Medicine

## 2022-07-02 ENCOUNTER — Emergency Department (HOSPITAL_COMMUNITY): Payer: Medicare PPO

## 2022-07-02 ENCOUNTER — Encounter: Payer: Self-pay | Admitting: Internal Medicine

## 2022-07-02 ENCOUNTER — Other Ambulatory Visit: Payer: Self-pay

## 2022-07-02 ENCOUNTER — Encounter (HOSPITAL_COMMUNITY): Payer: Self-pay

## 2022-07-02 ENCOUNTER — Inpatient Hospital Stay (HOSPITAL_COMMUNITY)
Admission: EM | Admit: 2022-07-02 | Discharge: 2022-07-25 | DRG: 871 | Disposition: A | Discharge status: Skilled Nursing Facility | Payer: Medicare PPO | Attending: Internal Medicine | Admitting: Internal Medicine

## 2022-07-02 DIAGNOSIS — K449 Diaphragmatic hernia without obstruction or gangrene: Secondary | ICD-10-CM | POA: Diagnosis present

## 2022-07-02 DIAGNOSIS — N184 Chronic kidney disease, stage 4 (severe): Secondary | ICD-10-CM | POA: Diagnosis present

## 2022-07-02 DIAGNOSIS — Z83438 Family history of other disorder of lipoprotein metabolism and other lipidemia: Secondary | ICD-10-CM

## 2022-07-02 DIAGNOSIS — M7989 Other specified soft tissue disorders: Secondary | ICD-10-CM | POA: Diagnosis not present

## 2022-07-02 DIAGNOSIS — K269 Duodenal ulcer, unspecified as acute or chronic, without hemorrhage or perforation: Secondary | ICD-10-CM | POA: Diagnosis not present

## 2022-07-02 DIAGNOSIS — M353 Polymyalgia rheumatica: Secondary | ICD-10-CM | POA: Diagnosis present

## 2022-07-02 DIAGNOSIS — R195 Other fecal abnormalities: Secondary | ICD-10-CM

## 2022-07-02 DIAGNOSIS — N10 Acute tubulo-interstitial nephritis: Secondary | ICD-10-CM | POA: Diagnosis not present

## 2022-07-02 DIAGNOSIS — Z66 Do not resuscitate: Secondary | ICD-10-CM | POA: Diagnosis present

## 2022-07-02 DIAGNOSIS — D649 Anemia, unspecified: Secondary | ICD-10-CM | POA: Diagnosis not present

## 2022-07-02 DIAGNOSIS — N3000 Acute cystitis without hematuria: Secondary | ICD-10-CM | POA: Diagnosis present

## 2022-07-02 DIAGNOSIS — M069 Rheumatoid arthritis, unspecified: Secondary | ICD-10-CM | POA: Diagnosis present

## 2022-07-02 DIAGNOSIS — E872 Acidosis, unspecified: Secondary | ICD-10-CM | POA: Diagnosis present

## 2022-07-02 DIAGNOSIS — A4151 Sepsis due to Escherichia coli [E. coli]: Principal | ICD-10-CM | POA: Diagnosis present

## 2022-07-02 DIAGNOSIS — E860 Dehydration: Secondary | ICD-10-CM | POA: Diagnosis present

## 2022-07-02 DIAGNOSIS — I129 Hypertensive chronic kidney disease with stage 1 through stage 4 chronic kidney disease, or unspecified chronic kidney disease: Secondary | ICD-10-CM | POA: Diagnosis present

## 2022-07-02 DIAGNOSIS — R6520 Severe sepsis without septic shock: Secondary | ICD-10-CM | POA: Diagnosis present

## 2022-07-02 DIAGNOSIS — I2489 Other forms of acute ischemic heart disease: Secondary | ICD-10-CM | POA: Diagnosis present

## 2022-07-02 DIAGNOSIS — J449 Chronic obstructive pulmonary disease, unspecified: Secondary | ICD-10-CM | POA: Diagnosis present

## 2022-07-02 DIAGNOSIS — Z7901 Long term (current) use of anticoagulants: Secondary | ICD-10-CM

## 2022-07-02 DIAGNOSIS — Z885 Allergy status to narcotic agent status: Secondary | ICD-10-CM

## 2022-07-02 DIAGNOSIS — E876 Hypokalemia: Secondary | ICD-10-CM | POA: Diagnosis not present

## 2022-07-02 DIAGNOSIS — E861 Hypovolemia: Secondary | ICD-10-CM | POA: Diagnosis not present

## 2022-07-02 DIAGNOSIS — J432 Centrilobular emphysema: Secondary | ICD-10-CM | POA: Diagnosis not present

## 2022-07-02 DIAGNOSIS — J9811 Atelectasis: Secondary | ICD-10-CM | POA: Diagnosis not present

## 2022-07-02 DIAGNOSIS — K250 Acute gastric ulcer with hemorrhage: Secondary | ICD-10-CM

## 2022-07-02 DIAGNOSIS — K254 Chronic or unspecified gastric ulcer with hemorrhage: Secondary | ICD-10-CM | POA: Diagnosis not present

## 2022-07-02 DIAGNOSIS — K209 Esophagitis, unspecified without bleeding: Secondary | ICD-10-CM | POA: Diagnosis not present

## 2022-07-02 DIAGNOSIS — B9620 Unspecified Escherichia coli [E. coli] as the cause of diseases classified elsewhere: Secondary | ICD-10-CM | POA: Insufficient documentation

## 2022-07-02 DIAGNOSIS — J9601 Acute respiratory failure with hypoxia: Secondary | ICD-10-CM | POA: Diagnosis not present

## 2022-07-02 DIAGNOSIS — I700 Atherosclerosis of aorta: Secondary | ICD-10-CM | POA: Diagnosis not present

## 2022-07-02 DIAGNOSIS — R609 Edema, unspecified: Secondary | ICD-10-CM | POA: Diagnosis not present

## 2022-07-02 DIAGNOSIS — Z1152 Encounter for screening for COVID-19: Secondary | ICD-10-CM | POA: Diagnosis not present

## 2022-07-02 DIAGNOSIS — K219 Gastro-esophageal reflux disease without esophagitis: Secondary | ICD-10-CM | POA: Diagnosis present

## 2022-07-02 DIAGNOSIS — K922 Gastrointestinal hemorrhage, unspecified: Secondary | ICD-10-CM | POA: Diagnosis present

## 2022-07-02 DIAGNOSIS — R531 Weakness: Secondary | ICD-10-CM | POA: Diagnosis not present

## 2022-07-02 DIAGNOSIS — K921 Melena: Secondary | ICD-10-CM | POA: Diagnosis not present

## 2022-07-02 DIAGNOSIS — R0602 Shortness of breath: Secondary | ICD-10-CM | POA: Diagnosis not present

## 2022-07-02 DIAGNOSIS — K3189 Other diseases of stomach and duodenum: Secondary | ICD-10-CM | POA: Diagnosis not present

## 2022-07-02 DIAGNOSIS — E785 Hyperlipidemia, unspecified: Secondary | ICD-10-CM | POA: Diagnosis present

## 2022-07-02 DIAGNOSIS — Z96642 Presence of left artificial hip joint: Secondary | ICD-10-CM | POA: Diagnosis present

## 2022-07-02 DIAGNOSIS — K264 Chronic or unspecified duodenal ulcer with hemorrhage: Secondary | ICD-10-CM | POA: Diagnosis not present

## 2022-07-02 DIAGNOSIS — I10 Essential (primary) hypertension: Secondary | ICD-10-CM | POA: Diagnosis present

## 2022-07-02 DIAGNOSIS — A419 Sepsis, unspecified organism: Secondary | ICD-10-CM | POA: Diagnosis not present

## 2022-07-02 DIAGNOSIS — D6832 Hemorrhagic disorder due to extrinsic circulating anticoagulants: Secondary | ICD-10-CM | POA: Diagnosis not present

## 2022-07-02 DIAGNOSIS — N3001 Acute cystitis with hematuria: Secondary | ICD-10-CM | POA: Diagnosis present

## 2022-07-02 DIAGNOSIS — R7881 Bacteremia: Secondary | ICD-10-CM | POA: Insufficient documentation

## 2022-07-02 DIAGNOSIS — Z86010 Personal history of colonic polyps: Secondary | ICD-10-CM

## 2022-07-02 DIAGNOSIS — Z8249 Family history of ischemic heart disease and other diseases of the circulatory system: Secondary | ICD-10-CM

## 2022-07-02 DIAGNOSIS — R29810 Facial weakness: Secondary | ICD-10-CM | POA: Diagnosis not present

## 2022-07-02 DIAGNOSIS — Z86711 Personal history of pulmonary embolism: Secondary | ICD-10-CM | POA: Diagnosis not present

## 2022-07-02 DIAGNOSIS — N170 Acute kidney failure with tubular necrosis: Secondary | ICD-10-CM | POA: Diagnosis not present

## 2022-07-02 DIAGNOSIS — G9341 Metabolic encephalopathy: Secondary | ICD-10-CM | POA: Diagnosis present

## 2022-07-02 DIAGNOSIS — E039 Hypothyroidism, unspecified: Secondary | ICD-10-CM | POA: Diagnosis present

## 2022-07-02 DIAGNOSIS — R4182 Altered mental status, unspecified: Secondary | ICD-10-CM

## 2022-07-02 DIAGNOSIS — Z951 Presence of aortocoronary bypass graft: Secondary | ICD-10-CM | POA: Diagnosis not present

## 2022-07-02 DIAGNOSIS — Z79890 Hormone replacement therapy (postmenopausal): Secondary | ICD-10-CM

## 2022-07-02 DIAGNOSIS — Z888 Allergy status to other drugs, medicaments and biological substances status: Secondary | ICD-10-CM

## 2022-07-02 DIAGNOSIS — Z833 Family history of diabetes mellitus: Secondary | ICD-10-CM

## 2022-07-02 DIAGNOSIS — N179 Acute kidney failure, unspecified: Secondary | ICD-10-CM | POA: Diagnosis not present

## 2022-07-02 DIAGNOSIS — D62 Acute posthemorrhagic anemia: Secondary | ICD-10-CM | POA: Diagnosis not present

## 2022-07-02 DIAGNOSIS — E782 Mixed hyperlipidemia: Secondary | ICD-10-CM | POA: Diagnosis not present

## 2022-07-02 DIAGNOSIS — Z8673 Personal history of transient ischemic attack (TIA), and cerebral infarction without residual deficits: Secondary | ICD-10-CM

## 2022-07-02 DIAGNOSIS — E871 Hypo-osmolality and hyponatremia: Secondary | ICD-10-CM | POA: Diagnosis present

## 2022-07-02 DIAGNOSIS — Z6828 Body mass index (BMI) 28.0-28.9, adult: Secondary | ICD-10-CM

## 2022-07-02 DIAGNOSIS — R0902 Hypoxemia: Secondary | ICD-10-CM | POA: Diagnosis not present

## 2022-07-02 DIAGNOSIS — Z823 Family history of stroke: Secondary | ICD-10-CM

## 2022-07-02 DIAGNOSIS — J90 Pleural effusion, not elsewhere classified: Secondary | ICD-10-CM | POA: Diagnosis not present

## 2022-07-02 DIAGNOSIS — Z7401 Bed confinement status: Secondary | ICD-10-CM | POA: Diagnosis not present

## 2022-07-02 DIAGNOSIS — I959 Hypotension, unspecified: Secondary | ICD-10-CM | POA: Diagnosis not present

## 2022-07-02 DIAGNOSIS — Z86718 Personal history of other venous thrombosis and embolism: Secondary | ICD-10-CM

## 2022-07-02 DIAGNOSIS — R7989 Other specified abnormal findings of blood chemistry: Secondary | ICD-10-CM

## 2022-07-02 DIAGNOSIS — I252 Old myocardial infarction: Secondary | ICD-10-CM

## 2022-07-02 DIAGNOSIS — I739 Peripheral vascular disease, unspecified: Secondary | ICD-10-CM | POA: Diagnosis present

## 2022-07-02 DIAGNOSIS — Z8679 Personal history of other diseases of the circulatory system: Secondary | ICD-10-CM

## 2022-07-02 DIAGNOSIS — R54 Age-related physical debility: Secondary | ICD-10-CM | POA: Diagnosis not present

## 2022-07-02 DIAGNOSIS — K279 Peptic ulcer, site unspecified, unspecified as acute or chronic, without hemorrhage or perforation: Secondary | ICD-10-CM | POA: Diagnosis not present

## 2022-07-02 DIAGNOSIS — Z886 Allergy status to analgesic agent status: Secondary | ICD-10-CM

## 2022-07-02 DIAGNOSIS — Z9081 Acquired absence of spleen: Secondary | ICD-10-CM

## 2022-07-02 DIAGNOSIS — Z87891 Personal history of nicotine dependence: Secondary | ICD-10-CM

## 2022-07-02 DIAGNOSIS — N189 Chronic kidney disease, unspecified: Secondary | ICD-10-CM | POA: Diagnosis not present

## 2022-07-02 DIAGNOSIS — R000 Tachycardia, unspecified: Secondary | ICD-10-CM | POA: Diagnosis not present

## 2022-07-02 DIAGNOSIS — E8779 Other fluid overload: Secondary | ICD-10-CM | POA: Diagnosis not present

## 2022-07-02 DIAGNOSIS — T45515A Adverse effect of anticoagulants, initial encounter: Secondary | ICD-10-CM | POA: Diagnosis not present

## 2022-07-02 DIAGNOSIS — Z79899 Other long term (current) drug therapy: Secondary | ICD-10-CM

## 2022-07-02 DIAGNOSIS — R918 Other nonspecific abnormal finding of lung field: Secondary | ICD-10-CM | POA: Diagnosis not present

## 2022-07-02 DIAGNOSIS — E875 Hyperkalemia: Secondary | ICD-10-CM | POA: Diagnosis not present

## 2022-07-02 DIAGNOSIS — G4700 Insomnia, unspecified: Secondary | ICD-10-CM | POA: Diagnosis not present

## 2022-07-02 DIAGNOSIS — E8729 Other acidosis: Secondary | ICD-10-CM | POA: Diagnosis present

## 2022-07-02 DIAGNOSIS — N3289 Other specified disorders of bladder: Secondary | ICD-10-CM | POA: Diagnosis not present

## 2022-07-02 DIAGNOSIS — I2510 Atherosclerotic heart disease of native coronary artery without angina pectoris: Secondary | ICD-10-CM | POA: Diagnosis not present

## 2022-07-02 DIAGNOSIS — K253 Acute gastric ulcer without hemorrhage or perforation: Secondary | ICD-10-CM | POA: Diagnosis not present

## 2022-07-02 DIAGNOSIS — E8770 Fluid overload, unspecified: Secondary | ICD-10-CM | POA: Diagnosis not present

## 2022-07-02 LAB — URINALYSIS, ROUTINE W REFLEX MICROSCOPIC
Bilirubin Urine: NEGATIVE
Glucose, UA: NEGATIVE mg/dL
Ketones, ur: NEGATIVE mg/dL
Nitrite: NEGATIVE
Protein, ur: >=300 mg/dL — AB
RBC / HPF: >50 RBC/hpf (ref 0–5)
Specific Gravity, Urine: 1.015 (ref 1.005–1.030)
WBC, UA: >50 WBC/hpf (ref 0–5)
pH: 5 (ref 5.0–8.0)

## 2022-07-02 LAB — I-STAT CHEM 8, ED
BUN: 118 mg/dL — ABNORMAL HIGH (ref 8–23)
Calcium, Ion: 0.85 mmol/L — CL (ref 1.15–1.40)
Chloride: 91 mmol/L — ABNORMAL LOW (ref 98–111)
Creatinine, Ser: 4.7 mg/dL — ABNORMAL HIGH (ref 0.44–1.00)
Glucose, Bld: 168 mg/dL — ABNORMAL HIGH (ref 70–99)
HCT: 38 % (ref 36.0–46.0)
Hemoglobin: 12.9 g/dL (ref 12.0–15.0)
Potassium: 5.9 mmol/L — ABNORMAL HIGH (ref 3.5–5.1)
Sodium: 124 mmol/L — ABNORMAL LOW (ref 135–145)
TCO2: 25 mmol/L (ref 22–32)

## 2022-07-02 LAB — PROTIME-INR
INR: 1.2 (ref 0.8–1.2)
Prothrombin Time: 15.2 seconds (ref 11.4–15.2)

## 2022-07-02 LAB — APTT: aPTT: 31 seconds (ref 24–36)

## 2022-07-02 NOTE — ED Triage Notes (Signed)
BIB EMS from [place]. Living. pt feels weak x2 days. Upon EMS arrival noticed left sided facial droop and left arm grip weakness. Unknown LKW. Usually ambulatory with walker, has been sliding out of chair.

## 2022-07-03 ENCOUNTER — Encounter (HOSPITAL_COMMUNITY): Payer: Self-pay | Admitting: Internal Medicine

## 2022-07-03 DIAGNOSIS — N3000 Acute cystitis without hematuria: Secondary | ICD-10-CM

## 2022-07-03 DIAGNOSIS — Z66 Do not resuscitate: Secondary | ICD-10-CM | POA: Diagnosis present

## 2022-07-03 DIAGNOSIS — R6520 Severe sepsis without septic shock: Secondary | ICD-10-CM | POA: Diagnosis present

## 2022-07-03 DIAGNOSIS — Z1152 Encounter for screening for COVID-19: Secondary | ICD-10-CM | POA: Diagnosis not present

## 2022-07-03 DIAGNOSIS — N184 Chronic kidney disease, stage 4 (severe): Secondary | ICD-10-CM

## 2022-07-03 DIAGNOSIS — J449 Chronic obstructive pulmonary disease, unspecified: Secondary | ICD-10-CM | POA: Diagnosis present

## 2022-07-03 DIAGNOSIS — I739 Peripheral vascular disease, unspecified: Secondary | ICD-10-CM | POA: Diagnosis present

## 2022-07-03 DIAGNOSIS — J9811 Atelectasis: Secondary | ICD-10-CM | POA: Diagnosis not present

## 2022-07-03 DIAGNOSIS — K254 Chronic or unspecified gastric ulcer with hemorrhage: Secondary | ICD-10-CM | POA: Diagnosis not present

## 2022-07-03 DIAGNOSIS — D649 Anemia, unspecified: Secondary | ICD-10-CM | POA: Diagnosis not present

## 2022-07-03 DIAGNOSIS — K921 Melena: Secondary | ICD-10-CM | POA: Diagnosis not present

## 2022-07-03 DIAGNOSIS — I129 Hypertensive chronic kidney disease with stage 1 through stage 4 chronic kidney disease, or unspecified chronic kidney disease: Secondary | ICD-10-CM | POA: Diagnosis present

## 2022-07-03 DIAGNOSIS — G9341 Metabolic encephalopathy: Secondary | ICD-10-CM | POA: Diagnosis present

## 2022-07-03 DIAGNOSIS — Z7901 Long term (current) use of anticoagulants: Secondary | ICD-10-CM

## 2022-07-03 DIAGNOSIS — I10 Essential (primary) hypertension: Secondary | ICD-10-CM | POA: Diagnosis not present

## 2022-07-03 DIAGNOSIS — K250 Acute gastric ulcer with hemorrhage: Secondary | ICD-10-CM | POA: Diagnosis not present

## 2022-07-03 DIAGNOSIS — E039 Hypothyroidism, unspecified: Secondary | ICD-10-CM | POA: Diagnosis present

## 2022-07-03 DIAGNOSIS — N10 Acute tubulo-interstitial nephritis: Secondary | ICD-10-CM | POA: Diagnosis present

## 2022-07-03 DIAGNOSIS — D62 Acute posthemorrhagic anemia: Secondary | ICD-10-CM | POA: Diagnosis not present

## 2022-07-03 DIAGNOSIS — N179 Acute kidney failure, unspecified: Secondary | ICD-10-CM

## 2022-07-03 DIAGNOSIS — D6832 Hemorrhagic disorder due to extrinsic circulating anticoagulants: Secondary | ICD-10-CM | POA: Diagnosis not present

## 2022-07-03 DIAGNOSIS — E871 Hypo-osmolality and hyponatremia: Secondary | ICD-10-CM | POA: Diagnosis present

## 2022-07-03 DIAGNOSIS — N3001 Acute cystitis with hematuria: Secondary | ICD-10-CM | POA: Diagnosis present

## 2022-07-03 DIAGNOSIS — R195 Other fecal abnormalities: Secondary | ICD-10-CM | POA: Diagnosis not present

## 2022-07-03 DIAGNOSIS — J9601 Acute respiratory failure with hypoxia: Secondary | ICD-10-CM | POA: Diagnosis not present

## 2022-07-03 DIAGNOSIS — K253 Acute gastric ulcer without hemorrhage or perforation: Secondary | ICD-10-CM | POA: Diagnosis not present

## 2022-07-03 DIAGNOSIS — A419 Sepsis, unspecified organism: Secondary | ICD-10-CM | POA: Diagnosis not present

## 2022-07-03 DIAGNOSIS — K264 Chronic or unspecified duodenal ulcer with hemorrhage: Secondary | ICD-10-CM | POA: Diagnosis not present

## 2022-07-03 DIAGNOSIS — K3189 Other diseases of stomach and duodenum: Secondary | ICD-10-CM | POA: Diagnosis not present

## 2022-07-03 DIAGNOSIS — A4151 Sepsis due to Escherichia coli [E. coli]: Secondary | ICD-10-CM | POA: Diagnosis present

## 2022-07-03 DIAGNOSIS — K269 Duodenal ulcer, unspecified as acute or chronic, without hemorrhage or perforation: Secondary | ICD-10-CM | POA: Diagnosis not present

## 2022-07-03 DIAGNOSIS — I2489 Other forms of acute ischemic heart disease: Secondary | ICD-10-CM | POA: Diagnosis present

## 2022-07-03 DIAGNOSIS — M069 Rheumatoid arthritis, unspecified: Secondary | ICD-10-CM | POA: Diagnosis present

## 2022-07-03 DIAGNOSIS — E872 Acidosis, unspecified: Secondary | ICD-10-CM | POA: Diagnosis present

## 2022-07-03 DIAGNOSIS — K279 Peptic ulcer, site unspecified, unspecified as acute or chronic, without hemorrhage or perforation: Secondary | ICD-10-CM | POA: Diagnosis not present

## 2022-07-03 DIAGNOSIS — K449 Diaphragmatic hernia without obstruction or gangrene: Secondary | ICD-10-CM | POA: Diagnosis not present

## 2022-07-03 DIAGNOSIS — K209 Esophagitis, unspecified without bleeding: Secondary | ICD-10-CM | POA: Diagnosis not present

## 2022-07-03 DIAGNOSIS — N170 Acute kidney failure with tubular necrosis: Secondary | ICD-10-CM | POA: Diagnosis not present

## 2022-07-03 DIAGNOSIS — Z86711 Personal history of pulmonary embolism: Secondary | ICD-10-CM

## 2022-07-03 LAB — DIFFERENTIAL
Abs Immature Granulocytes: 0.4 10*3/uL — ABNORMAL HIGH (ref 0.00–0.07)
Basophils Absolute: 0.1 10*3/uL (ref 0.0–0.1)
Basophils Relative: 1 %
Eosinophils Absolute: 0 10*3/uL (ref 0.0–0.5)
Eosinophils Relative: 0 %
Immature Granulocytes: 2 %
Lymphocytes Relative: 4 %
Lymphs Abs: 0.8 10*3/uL (ref 0.7–4.0)
Monocytes Absolute: 2.4 10*3/uL — ABNORMAL HIGH (ref 0.1–1.0)
Monocytes Relative: 10 %
Neutro Abs: 20.1 10*3/uL — ABNORMAL HIGH (ref 1.7–7.7)
Neutrophils Relative %: 83 %

## 2022-07-03 LAB — BLOOD CULTURE ID PANEL (REFLEXED) - BCID2

## 2022-07-03 LAB — CBC
HCT: 33 % — ABNORMAL LOW (ref 36.0–46.0)
Hemoglobin: 11.8 g/dL — ABNORMAL LOW (ref 12.0–15.0)
MCH: 31.9 pg (ref 26.0–34.0)
MCHC: 35.8 g/dL (ref 30.0–36.0)
MCV: 89.2 fL (ref 80.0–100.0)
Platelets: 198 10*3/uL (ref 150–400)
RBC: 3.7 MIL/uL — ABNORMAL LOW (ref 3.87–5.11)
RDW: 18.6 % — ABNORMAL HIGH (ref 11.5–15.5)
WBC: 23.8 10*3/uL — ABNORMAL HIGH (ref 4.0–10.5)
nRBC: 0.3 % — ABNORMAL HIGH (ref 0.0–0.2)

## 2022-07-03 LAB — COMPREHENSIVE METABOLIC PANEL
ALT: 20 U/L (ref 0–44)
AST: 38 U/L (ref 15–41)
Albumin: 2.5 g/dL — ABNORMAL LOW (ref 3.5–5.0)
Alkaline Phosphatase: 95 U/L (ref 38–126)
Anion gap: 17 — ABNORMAL HIGH (ref 5–15)
BUN: 94 mg/dL — ABNORMAL HIGH (ref 8–23)
CO2: 19 mmol/L — ABNORMAL LOW (ref 22–32)
Calcium: 8.6 mg/dL — ABNORMAL LOW (ref 8.9–10.3)
Chloride: 92 mmol/L — ABNORMAL LOW (ref 98–111)
Creatinine, Ser: 4.09 mg/dL — ABNORMAL HIGH (ref 0.44–1.00)
GFR, Estimated: 10 mL/min — ABNORMAL LOW (ref >60)
Glucose, Bld: 166 mg/dL — ABNORMAL HIGH (ref 70–99)
Potassium: 4.3 mmol/L (ref 3.5–5.1)
Sodium: 128 mmol/L — ABNORMAL LOW (ref 135–145)
Total Bilirubin: 1 mg/dL (ref 0.3–1.2)
Total Protein: 5.4 g/dL — ABNORMAL LOW (ref 6.5–8.1)

## 2022-07-03 LAB — RAPID URINE DRUG SCREEN, HOSP PERFORMED
Amphetamines: NOT DETECTED
Barbiturates: NOT DETECTED
Benzodiazepines: NOT DETECTED
Cocaine: NOT DETECTED
Opiates: NOT DETECTED
Tetrahydrocannabinol: NOT DETECTED

## 2022-07-03 LAB — LACTIC ACID, PLASMA: Lactic Acid, Venous: 1.5 mmol/L (ref 0.5–1.9)

## 2022-07-03 LAB — RESP PANEL BY RT-PCR (RSV, FLU A&B, COVID)  RVPGX2
Influenza A by PCR: NEGATIVE
Influenza B by PCR: NEGATIVE
Resp Syncytial Virus by PCR: NEGATIVE
SARS Coronavirus 2 by RT PCR: NEGATIVE

## 2022-07-03 LAB — CBG MONITORING, ED: Glucose-Capillary: 136 mg/dL — ABNORMAL HIGH (ref 70–99)

## 2022-07-03 LAB — ETHANOL: Alcohol, Ethyl (B): <10 mg/dL (ref <10)

## 2022-07-03 MED ORDER — LACTATED RINGERS IV BOLUS (SEPSIS)
1000.0000 mL | Freq: Once | INTRAVENOUS | Status: AC
  Administered 2022-07-03: 1000 mL via INTRAVENOUS

## 2022-07-03 MED ORDER — SODIUM CHLORIDE 0.9 % IV SOLN
1.0000 g | Freq: Once | INTRAVENOUS | Status: AC
  Administered 2022-07-03: 1 g via INTRAVENOUS
  Filled 2022-07-03: qty 10

## 2022-07-03 MED ORDER — LABETALOL HCL 5 MG/ML IV SOLN
10.0000 mg | Freq: Once | INTRAVENOUS | Status: AC
  Administered 2022-07-03: 10 mg via INTRAVENOUS
  Filled 2022-07-03: qty 4

## 2022-07-03 MED ORDER — METRONIDAZOLE 500 MG/100ML IV SOLN: 500.0000 mg | Freq: Once | INTRAVENOUS | Status: DC

## 2022-07-03 MED ORDER — ONDANSETRON HCL 4 MG/2ML IJ SOLN: 4.0000 mg | Freq: Four times a day (QID) | INTRAMUSCULAR | Status: DC | PRN

## 2022-07-03 MED ORDER — SODIUM CHLORIDE 0.9 % IV SOLN
2.0000 g | INTRAVENOUS | Status: DC
  Administered 2022-07-03 – 2022-07-04 (×2): 2 g via INTRAVENOUS
  Filled 2022-07-03 (×2): qty 20

## 2022-07-03 MED ORDER — LACTATED RINGERS IV SOLN: INTRAVENOUS | Status: DC

## 2022-07-03 MED ORDER — TRIAMCINOLONE ACETONIDE 55 MCG/ACT NA AERO
1.0000 | INHALATION_SPRAY | Freq: Every day | NASAL | Status: DC
  Administered 2022-07-04 – 2022-07-25 (×18): 1 via NASAL
  Filled 2022-07-03 (×2): qty 10.8

## 2022-07-03 MED ORDER — SODIUM CHLORIDE 0.9 % IV SOLN: 1.0000 g | INTRAVENOUS | Status: DC

## 2022-07-03 MED ORDER — SODIUM CHLORIDE 0.9 % IV SOLN: INTRAVENOUS | Status: DC

## 2022-07-03 MED ORDER — ACETAMINOPHEN 650 MG RE SUPP: 650.0000 mg | Freq: Four times a day (QID) | RECTAL | Status: DC | PRN

## 2022-07-03 MED ORDER — ATORVASTATIN CALCIUM 80 MG PO TABS
80.0000 mg | ORAL_TABLET | Freq: Every day | ORAL | Status: DC
  Administered 2022-07-03 – 2022-07-25 (×23): 80 mg via ORAL
  Filled 2022-07-03 (×7): qty 1
  Filled 2022-07-03: qty 2
  Filled 2022-07-03 (×15): qty 1

## 2022-07-03 MED ORDER — VANCOMYCIN HCL 1250 MG/250ML IV SOLN
1250.0000 mg | Freq: Once | INTRAVENOUS | Status: DC
  Filled 2022-07-03: qty 250

## 2022-07-03 MED ORDER — ONDANSETRON HCL 4 MG PO TABS: 4.0000 mg | ORAL_TABLET | Freq: Four times a day (QID) | ORAL | Status: DC | PRN

## 2022-07-03 MED ORDER — SODIUM CHLORIDE 0.9 % IV SOLN: 2.0000 g | Freq: Once | INTRAVENOUS | Status: DC

## 2022-07-03 MED ORDER — LEVOTHYROXINE SODIUM 88 MCG PO TABS
88.0000 ug | ORAL_TABLET | Freq: Every day | ORAL | Status: DC
  Administered 2022-07-03 – 2022-07-25 (×23): 88 ug via ORAL
  Filled 2022-07-03 (×23): qty 1

## 2022-07-03 MED ORDER — METOPROLOL TARTRATE 5 MG/5ML IV SOLN
5.0000 mg | Freq: Once | INTRAVENOUS | Status: AC
  Administered 2022-07-03: 5 mg via INTRAVENOUS
  Filled 2022-07-03: qty 5

## 2022-07-03 MED ORDER — APIXABAN 2.5 MG PO TABS
2.5000 mg | ORAL_TABLET | Freq: Two times a day (BID) | ORAL | Status: DC
  Administered 2022-07-03 – 2022-07-06 (×8): 2.5 mg via ORAL
  Filled 2022-07-03 (×8): qty 1

## 2022-07-03 MED ORDER — VANCOMYCIN HCL IN DEXTROSE 1-5 GM/200ML-% IV SOLN: 1000.0000 mg | Freq: Once | INTRAVENOUS | Status: DC

## 2022-07-03 MED ORDER — ACETAMINOPHEN 325 MG PO TABS
650.0000 mg | ORAL_TABLET | Freq: Four times a day (QID) | ORAL | Status: DC | PRN
  Administered 2022-07-03 – 2022-07-25 (×10): 650 mg via ORAL
  Filled 2022-07-03 (×10): qty 2

## 2022-07-03 NOTE — Assessment & Plan Note (Addendum)
Stable. Asymptomatic.

## 2022-07-03 NOTE — ED Notes (Signed)
MD aware of continued elevated BP after medication administration.

## 2022-07-03 NOTE — Assessment & Plan Note (Addendum)
-  Continue Lipitor daily

## 2022-07-03 NOTE — Evaluation (Signed)
Physical Therapy Evaluation Patient Details Name: [name] MRN: [id number] DOB: 1940-06-21 Today's Date: 07/03/2022  History of Present Illness  82 y.o. female presents to [facility] hospital on 07/02/2022 with worsening weakness. Pt admitted for management of acute cystitis. PMH includes CKD IV, HTN, RA, hypothyroidism, CABG, COPD, HLD, PE.  Clinical Impression  Pt presents to PT with deficits in strength, power, gait, balance, functional mobility, endurance. Pt is very tired upon PT arrival, not having slept well overnight. Pt is generally weak and benefits from physical assistance during bed mobility and transfers. Pt demonstrates instability when ambulating for brief distances and appears to fatigue quickly. PT recommends SNF placement at this time (or higher level of care at [facility] if available) due to pt's current risk for further falls.     Recommendations for follow up therapy are one component of a multi-disciplinary discharge planning process, led by the attending physician.  Recommendations may be updated based on patient status, additional functional criteria and insurance authorization.  Follow Up Recommendations Skilled nursing-short term rehab (<3 hours/day) (SNF at [facility]) Can patient physically be transported by private vehicle: Yes    Assistance Recommended at Discharge Intermittent Supervision/Assistance  Patient can return home with the following  A lot of help with walking and/or transfers;A lot of help with bathing/dressing/bathroom;Assistance with cooking/housework;Assist for transportation;Help with stairs or ramp for entrance;Direct supervision/assist for medications management;Direct supervision/assist for financial management    Equipment Recommendations  (TBD pending progress)  Recommendations for Other Services       Functional Status Assessment Patient has had a recent decline in their functional status and demonstrates the ability to make  significant improvements in function in a reasonable and predictable amount of time.     Precautions / Restrictions Precautions Precautions: Fall Restrictions Weight Bearing Restrictions: No      Mobility  Bed Mobility Overal bed mobility: Needs Assistance Bed Mobility: Rolling, Sidelying to Sit, Sit to Supine Rolling: Min assist Sidelying to sit: Min assist   Sit to supine: Min assist        Transfers Overall transfer level: Needs assistance Equipment used: 1 person hand held assist Transfers: Sit to/from Stand Sit to Stand: Min assist                Ambulation/Gait Ambulation/Gait assistance: Min assist Gait Distance (Feet): 15 Feet Assistive device:  (unilateral UE support of bed) Gait Pattern/deviations: Step-to pattern, Wide base of support Gait velocity: reduced Gait velocity interpretation: <1.31 ft/sec, indicative of household ambulator   General Gait Details: pt with slowed step-to gait, widened BOS, increased lateral sway, UE support of stretcher walking around edge of bed  Stairs            Wheelchair Mobility    Modified Rankin (Stroke Patients Only)       Balance Overall balance assessment: Needs assistance Sitting-balance support: Single extremity supported, Feet supported Sitting balance-Leahy Scale: Poor Sitting balance - Comments: reliant on UE support Postural control: Posterior lean Standing balance support: Single extremity supported Standing balance-Leahy Scale: Poor Standing balance comment: minG-minA                             Pertinent Vitals/Pain Pain Assessment Pain Assessment: No/denies pain    Home Living Family/patient expects to be discharged to:: Other (Comment)                   Additional Comments: ILF at  [facility]    Prior Function Prior Level of Function : Independent/Modified Independent             Mobility Comments: ambulatory with SPC vs rollator ADLs Comments:  independent with ADLs     Hand Dominance        Extremity/Trunk Assessment   Upper Extremity Assessment Upper Extremity Assessment: Generalized weakness    Lower Extremity Assessment Lower Extremity Assessment: Generalized weakness    Cervical / Trunk Assessment Cervical / Trunk Assessment: Kyphotic  Communication   Communication: No difficulties  Cognition Arousal/Alertness: Lethargic (intermittently falling asleep with history, becomes more alert with mobility) Behavior During Therapy: WFL for tasks assessed/performed Overall Cognitive Status: Impaired/Different from baseline Area of Impairment: Problem solving                             Problem Solving: Slow processing          General Comments General comments (skin integrity, edema, etc.): VSS on RA    Exercises     Assessment/Plan    PT Assessment Patient needs continued PT services  PT Problem List Decreased strength;Decreased activity tolerance;Decreased mobility;Decreased balance;Decreased knowledge of use of DME       PT Treatment Interventions DME instruction;Gait training;Functional mobility training;Therapeutic activities;Therapeutic exercise;Neuromuscular re-education;Balance training;Patient/family education    PT Goals (Current goals can be found in the Care Plan section)  Acute Rehab PT Goals Patient Stated Goal: to improve strength, return to independence PT Goal Formulation: With patient Time For Goal Achievement: 07/17/22 Potential to Achieve Goals: Good    Frequency Min 3X/week (may progress quickly)     Co-evaluation               AM-PAC PT "6 Clicks" Mobility  Outcome Measure Help needed turning from your back to your side while in a flat bed without using bedrails?: A Little Help needed moving from lying on your back to sitting on the side of a flat bed without using bedrails?: A Little Help needed moving to and from a bed to a chair (including a wheelchair)?:  A Little Help needed standing up from a chair using your arms (e.g., wheelchair or bedside chair)?: A Little Help needed to walk in hospital room?: Total Help needed climbing 3-5 steps with a railing? : Total 6 Click Score: 14    End of Session Equipment Utilized During Treatment: Gait belt Activity Tolerance: Patient tolerated treatment well Patient left: in bed;with call bell/phone within reach Nurse Communication: Mobility status PT Visit Diagnosis: Other abnormalities of gait and mobility (R26.89);Muscle weakness (generalized) (M62.81);Repeated falls (R29.6)    Time: [id number] PT Time Calculation (min) (ACUTE ONLY): 25 min   Charges:   PT Evaluation $PT Eval Low Complexity: [place], PT, DPT Acute Rehabilitation Office [phone]   Zenaida Niece 07/03/2022, 1:14 PM

## 2022-07-03 NOTE — Progress Notes (Signed)
CSW left voicemail for Katie with [facility].  Gilmore Laroche, MSW, [facility]

## 2022-07-03 NOTE — ED Notes (Signed)
Upon arrival patient brief was saturated with urine. Patient was cleaned and changed into new brief.

## 2022-07-03 NOTE — ED Notes (Signed)
Transfer of care report given to RN, Neta.

## 2022-07-03 NOTE — Assessment & Plan Note (Addendum)
-  Continue Synthroid 88 mcg daily

## 2022-07-03 NOTE — ED Notes (Signed)
Alerted Kristopher Oppenheim MD regarding pts elevated BP. Orders placed.

## 2022-07-03 NOTE — Assessment & Plan Note (Addendum)
Creatinine of 4.70 on admission secondary to poor oral intake in addition to patient continuing diuretic use. Creatinine improved to 4.09 with IV fluids -Continue IV fluids -BMP in AM

## 2022-07-03 NOTE — ED Notes (Signed)
Bladder scan yielded 50 mL.

## 2022-07-03 NOTE — Assessment & Plan Note (Addendum)
Patient with non-specific symptoms but with associated fever and leukocytosis. No flank pain, but clinically consistent with pyelonephritis. Urinalysis suggests infection. Patient started empirically on Ceftriaxone IV -Increase to Ceftriaxone 2 g IV -Follow-up blood and urine cultures

## 2022-07-03 NOTE — Progress Notes (Signed)
PHARMACY - PHYSICIAN COMMUNICATION CRITICAL VALUE ALERT - BLOOD CULTURE IDENTIFICATION (BCID)  [name] is an 82 y.o. female who presented to [facility] on 07/02/2022 with a chief complaint of weakness.   Assessment:  82 year old female admitted with weakness. Found to have cystitis. Now with E Coli in both sets of blood cultures from likely urinary source.   Name of physician (or Provider) ContactedLonny Prude  Current antibiotics: Ceftriaxone  Changes to prescribed antibiotics recommended:  None - on appropriate antibiotic with ceftriaxone  Results for orders placed or performed during the hospital encounter of 07/02/22  Blood Culture ID Panel (Reflexed) (Collected: 07/02/2022 11:00 PM)  Result Value Ref Range   Enterococcus faecalis NOT DETECTED NOT DETECTED   Enterococcus Faecium NOT DETECTED NOT DETECTED   Listeria monocytogenes NOT DETECTED NOT DETECTED   Staphylococcus species NOT DETECTED NOT DETECTED   Staphylococcus aureus (BCID) NOT DETECTED NOT DETECTED   Staphylococcus epidermidis NOT DETECTED NOT DETECTED   Staphylococcus lugdunensis NOT DETECTED NOT DETECTED   Streptococcus species NOT DETECTED NOT DETECTED   Streptococcus agalactiae NOT DETECTED NOT DETECTED   Streptococcus pneumoniae NOT DETECTED NOT DETECTED   Streptococcus pyogenes NOT DETECTED NOT DETECTED   A.calcoaceticus-baumannii NOT DETECTED NOT DETECTED   Bacteroides fragilis NOT DETECTED NOT DETECTED   Enterobacterales DETECTED (A) NOT DETECTED   Enterobacter cloacae complex NOT DETECTED NOT DETECTED   Escherichia coli DETECTED (A) NOT DETECTED   Klebsiella aerogenes NOT DETECTED NOT DETECTED   Klebsiella oxytoca NOT DETECTED NOT DETECTED   Klebsiella pneumoniae NOT DETECTED NOT DETECTED   Proteus species NOT DETECTED NOT DETECTED   Salmonella species NOT DETECTED NOT DETECTED   Serratia marcescens NOT DETECTED NOT DETECTED   Haemophilus influenzae NOT DETECTED NOT DETECTED   Neisseria meningitidis  NOT DETECTED NOT DETECTED   Pseudomonas aeruginosa NOT DETECTED NOT DETECTED   Stenotrophomonas maltophilia NOT DETECTED NOT DETECTED   Candida albicans NOT DETECTED NOT DETECTED   Candida auris NOT DETECTED NOT DETECTED   Candida glabrata NOT DETECTED NOT DETECTED   Candida krusei NOT DETECTED NOT DETECTED   Candida parapsilosis NOT DETECTED NOT DETECTED   Candida tropicalis NOT DETECTED NOT DETECTED   Cryptococcus neoformans/gattii NOT DETECTED NOT DETECTED   CTX-M ESBL NOT DETECTED NOT DETECTED   Carbapenem resistance IMP NOT DETECTED NOT DETECTED   Carbapenem resistance KPC NOT DETECTED NOT DETECTED   Carbapenem resistance NDM NOT DETECTED NOT DETECTED   Carbapenem resist OXA 48 LIKE NOT DETECTED NOT DETECTED   Carbapenem resistance VIM NOT DETECTED NOT DETECTED    Jimmy Footman, PharmD, BCPS, BCIDP Infectious Diseases Clinical Pharmacist Phone: [phone] 07/03/2022  3:32 PM

## 2022-07-03 NOTE — Assessment & Plan Note (Addendum)
Appears to be improved today with IV fluids. Likely related to overall illness but more specifically dehydration and hyponatremia. Patient is from independent living.

## 2022-07-03 NOTE — Sepsis Progress Note (Signed)
Elink following code sepsis °

## 2022-07-03 NOTE — ED Notes (Signed)
Bladder scan 50 ml.

## 2022-07-03 NOTE — ED Notes (Signed)
Pt moved to room 6. Positioned for comfort and warm blankets placed.   Pt is alert, and obeys commands.  Resp even unlabored,  lungs clear.  Abd nontender and nondistended.  Denies pain.  Previous nurse unable to insert foley catheter.  Purwick placed and attached to suction.

## 2022-07-03 NOTE — Hospital Course (Signed)
[name] is a 82 y.o. female with a history of CKD stage IV, hypertension, rheumatoid arthritis, hypothyroidism, CAD s/p CABG, COPD, hyperlipidemia, DVT/PE. Patient presented secondary to weakness and fever and is found to have evidence of sepsis with a likely urinary source. Empiric antibiotics started and cultures obtained. Patient also found to have hyponatremia with associated dehydration and AKI. IV fluids started.

## 2022-07-03 NOTE — Assessment & Plan Note (Addendum)
Present on admission. Associated fever and leukocytosis with WBC of 23,800 on admission. Urinary source. Blood and urine cultures obtained. Empiric antibiotics started. -Follow-up culture data

## 2022-07-03 NOTE — Progress Notes (Signed)
PROGRESS NOTE    [name]  [id number] DOB: [birth year] DOA: 07/02/2022 PCP: Cassandria Anger, MD   Brief Narrative: NECOLE [name] is a 82 y.o. female with a history of CKD stage IV, hypertension, rheumatoid arthritis, hypothyroidism, CAD s/p CABG, COPD, hyperlipidemia, DVT/PE. Patient presented secondary to weakness and fever and is found to have evidence of sepsis with a likely urinary source. Empiric antibiotics started and cultures obtained. Patient also found to have hyponatremia with associated dehydration and AKI. IV fluids started.   Assessment and Plan: * Acute cystitis without hematuria Patient with non-specific symptoms but with associated fever and leukocytosis. No flank pain, but clinically consistent with pyelonephritis. Urinalysis suggests infection. Patient started empirically on Ceftriaxone IV -Increase to Ceftriaxone 2 g IV -Follow-up blood and urine cultures  Sepsis with acute renal failure ([place]) Present on admission. Associated fever and leukocytosis with WBC of 23,800 on admission. Urinary source. Blood and urine cultures obtained. Empiric antibiotics started. -Follow-up culture data  Hyponatremia Sodium of 124 on admission. Likely related to dehydration and hypovolemia, present on admission. Patient started on IV fluids with initial improvement in sodium to 128. -Continue IV fluids -Repeat BMP  Acute metabolic encephalopathy Appears to be improved today with IV fluids. Likely related to overall illness but more specifically dehydration and hyponatremia. Patient is from independent living.  Acute renal failure superimposed on stage 4 chronic kidney disease (HCC) Creatinine of 4.70 on admission secondary to poor oral intake in addition to patient continuing diuretic use. Creatinine improved to 4.09 with IV fluids -Continue IV fluids -BMP in AM  History of pulmonary embolism -Continue Eliquis  CKD (chronic kidney disease) stage 4, GFR 15-29 ml/min  (HCC) - baseline SCr 2.5. follows with Dr. Royce Macadamia with [facility] Baseline serum creatinine of about 2.5.  COPD (chronic obstructive pulmonary disease) (HCC) Stable. Asymptomatic.  Hyperlipidemia -Continue Lipitor daily  Hx of CABG Noted. No chest pain.  Long term (current) use of anticoagulants Noted. Patient is on Eliquis as an outpatient for the indication of PE/DVT which does not qualify for dose reduction, however patient is on reduced dose of 2.5 mg BID as an outpatient; possibly related to history of GI bleeding. -Resume home dose of Eliquis 2.5 mg BID  Rheumatoid arthritis ([place]) Patient is on [place] as an outpatient which is held secondary to active infection.  Essential hypertension Patient is managed on Coreg as an outpatient. Uncontrolled while admitted. -Resume home Coreg  Acquired hypothyroidism -Continue Synthroid 88 mcg daily     DVT prophylaxis: Eliquis Code Status:   Code Status: DNR Family Communication: None at bedside Disposition Plan: Discharge back to ILF vs higher level of care pending PT/OT, continued infectious workup/management with transition to oral antibiotics and continued improvement of hyponatremia   Consultants:  None  Procedures:  None  Antimicrobials: Ceftriaxone    Subjective: Patient reports no issues this morning. Feeling slightly better  Objective: BP (!) 155/70   Pulse (!) 101   Temp (!) 100.9 F (38.3 C) (Oral)   Resp 18   Ht '5\' 3"'$  (1.6 m)   Wt 63.5 kg   SpO2 96%   BMI 24.80 kg/m   Examination:  General exam: Appears calm and comfortable Respiratory system: Clear to auscultation. Respiratory effort normal. Cardiovascular system: S1 & S2 heard, RRR. 2/6 systolic murmur Gastrointestinal system: Abdomen is nondistended, soft and nontender. No organomegaly or masses felt. Normal bowel sounds heard. Central nervous system: Alert and oriented. No focal neurological deficits. Musculoskeletal: No  calf  tenderness Skin: No cyanosis. No rashes Psychiatry: Judgement and insight appear normal. Mood & affect appropriate.    Data Reviewed: I have personally reviewed following labs and imaging studies  CBC Lab Results  Component Value Date   WBC 23.8 (H) 07/02/2022   RBC 3.70 (L) 07/02/2022   HGB 12.9 07/02/2022   HCT 38.0 07/02/2022   MCV 89.2 07/02/2022   MCH 31.9 07/02/2022   PLT 198 07/02/2022   MCHC 35.8 07/02/2022   RDW 18.6 (H) 07/02/2022   LYMPHSABS 0.8 07/02/2022   MONOABS 2.4 (H) 07/02/2022   EOSABS 0.0 07/02/2022   BASOSABS 0.1 [id number]     Last metabolic panel Lab Results  Component Value Date   NA 128 (L) 07/03/2022   K 4.3 07/03/2022   CL 92 (L) 07/03/2022   CO2 19 (L) 07/03/2022   BUN 94 (H) 07/03/2022   CREATININE 4.09 (H) 07/03/2022   GLUCOSE 166 (H) 07/03/2022   GFRNONAA 10 (L) 07/03/2022   GFRAA 24 (L) 12/26/2019   CALCIUM 8.6 (L) 07/03/2022   PROT 5.4 (L) 07/03/2022   ALBUMIN 2.5 (L) 07/03/2022   BILITOT 1.0 07/03/2022   ALKPHOS 95 07/03/2022   AST 38 07/03/2022   ALT 20 07/03/2022   ANIONGAP 17 (H) 07/03/2022    GFR: Estimated Creatinine Clearance: 9.2 mL/min (A) (by C-G formula based on SCr of 4.09 mg/dL (H)).  Recent Results (from the past 240 hour(s))  Resp panel by RT-PCR (RSV, Flu A&B, Covid) Peripheral     Status: None   Collection Time: 07/02/22 11:00 PM   Specimen: Peripheral; Nasal Swab  Result Value Ref Range Status   SARS Coronavirus 2 by RT PCR NEGATIVE NEGATIVE Final   Influenza A by PCR NEGATIVE NEGATIVE Final   Influenza B by PCR NEGATIVE NEGATIVE Final    Comment: (NOTE) The Xpert Xpress SARS-CoV-2/FLU/RSV plus assay is intended as an aid in the diagnosis of influenza from Nasopharyngeal swab specimens and should not be used as a sole basis for treatment. Nasal washings and aspirates are unacceptable for Xpert Xpress SARS-CoV-2/FLU/RSV testing.  Fact Sheet for  Patients: EntrepreneurPulse.com.au  Fact Sheet for Healthcare Providers: IncredibleEmployment.be  This test is not yet approved or cleared by the [place] FDA and has been authorized for detection and/or diagnosis of SARS-CoV-2 by FDA under an Emergency Use Authorization (EUA). This EUA will remain in effect (meaning this test can be used) for the duration of the COVID-19 declaration under Section 564(b)(1) of the Act, 21 U.S.C. section 360bbb-3(b)(1), unless the authorization is terminated or revoked.     Resp Syncytial Virus by PCR NEGATIVE NEGATIVE Final    Comment: (NOTE) Fact Sheet for Patients: EntrepreneurPulse.com.au  Fact Sheet for Healthcare Providers: IncredibleEmployment.be  This test is not yet approved or cleared by the [place] FDA and has been authorized for detection and/or diagnosis of SARS-CoV-2 by FDA under an Emergency Use Authorization (EUA). This EUA will remain in effect (meaning this test can be used) for the duration of the COVID-19 declaration under Section 564(b)(1) of the Act, 21 U.S.C. section 360bbb-3(b)(1), unless the authorization is terminated or revoked.  Performed at [facility], [place] [address], [place], [place] [address]   Blood Culture (routine x 2)     Status: None (Preliminary result)   Collection Time: 07/02/22 11:00 PM   Specimen: BLOOD  Result Value Ref Range Status   Specimen Description BLOOD RIGHT ARM  Final   Special Requests   Final  BOTTLES DRAWN AEROBIC AND ANAEROBIC Blood Culture adequate volume   Culture   Final    NO GROWTH < 12 HOURS Performed at [facility], [place] [address], [place], [place] [address]    Report Status PENDING  Incomplete  Blood Culture (routine x 2)     Status: None (Preliminary result)   Collection Time: 07/03/22  2:20 AM   Specimen: BLOOD  Result Value Ref Range Status   Specimen Description BLOOD  LEFT ARM  Final   Special Requests   Final    BOTTLES DRAWN AEROBIC AND ANAEROBIC Blood Culture adequate volume   Culture   Final    NO GROWTH < 12 HOURS Performed at [facility], [place] [address] [place], [place] [address]    Report Status PENDING  Incomplete      Radiology Studies: DG Chest Portable 1 View  Result Date: 07/03/2022 CLINICAL DATA:  Altered mental status EXAM: PORTABLE CHEST 1 VIEW COMPARISON:  03/06/2022 FINDINGS: Prior CABG. Heart is borderline in size. Bibasilar linear atelectasis or scarring. No effusions or edema. No acute bony abnormality. IMPRESSION: Bibasilar atelectasis or scarring. Electronically Signed   By: Rolm Baptise M.D.   On: 07/03/2022 00:16   CT HEAD WO CONTRAST (5MM)  Result Date: 07/02/2022 CLINICAL DATA:  Weakness for 2 days with left facial droop EXAM: CT HEAD WITHOUT CONTRAST TECHNIQUE: Contiguous axial images were obtained from the base of the skull through the vertex without intravenous contrast. RADIATION DOSE REDUCTION: This exam was performed according to the departmental dose-optimization program which includes automated exposure control, adjustment of the mA and/or kV according to patient size and/or use of iterative reconstruction technique. COMPARISON:  02/06/2022 FINDINGS: Brain: No evidence of acute infarction, hemorrhage, hydrocephalus, extra-axial collection or mass lesion/mass effect. Mild atrophic changes and chronic white matter ischemic changes are noted. Vascular: No hyperdense vessel or unexpected calcification. Skull: Normal. Negative for fracture or focal lesion. Sinuses/Orbits: No acute finding. Other: None. IMPRESSION: Chronic atrophic and ischemic changes.  No acute abnormality noted. Electronically Signed   By: Inez Catalina M.D.   On: 07/02/2022 23:45      LOS: 0 days    Cordelia Poche, MD [facility] 07/03/2022, 11:16 AM   If 7PM-7AM, please contact night-coverage [email or web address]

## 2022-07-03 NOTE — ED Provider Notes (Signed)
[facility] Provider Note   CSN: [id number] Arrival date & time: 07/02/22  2303     History  Chief Complaint  Patient presents with   Extremity Weakness    [name] is a 82 y.o. female.  The history is provided by the EMS personnel. The history is limited by the condition of the patient (level 5 caveat).  Extremity Weakness Episode onset: more than 2 days, but unknown onset. The problem occurs constantly. The problem has not changed since onset.Nothing aggravates the symptoms. Nothing relieves the symptoms. She has tried nothing for the symptoms. The treatment provided no relief.       Home Medications Prior to Admission medications   Medication Sig Start Date End Date Taking? Authorizing Provider  apixaban (ELIQUIS) 2.5 MG TABS tablet Take 1 tablet (2.5 mg total) by mouth 2 (two) times daily. 04/28/22  Yes Plotnikov, [name], MD  acetaminophen (TYLENOL) 325 MG tablet Take 2 tablets (650 mg total) by mouth 2 (two) times daily as needed (generalized pain). 03/27/22   Mast, Man X, NP  albuterol (VENTOLIN HFA) 108 (90 Base) MCG/ACT inhaler Inhale 1-2 puffs into the lungs every 4 (four) hours as needed for wheezing or shortness of breath. 03/27/22   Mast, Man X, NP  ascorbic acid (VITAMIN C) 500 MG tablet Take 1 tablet (500 mg total) by mouth daily. 03/27/22   Mast, Man X, NP  atorvastatin (LIPITOR) 80 MG tablet Take 1 tablet (80 mg total) by mouth daily. 03/27/22   Mast, Man X, NP  carvedilol (COREG) 3.125 MG tablet Take 1 tablet (3.125 mg total) by mouth 2 (two) times daily with a meal. 05/23/22   Plotnikov, [name], MD  Cholecalciferol (VITAMIN D3) 50 MCG (2000 UT) capsule Take 1 capsule (2,000 Units total) by mouth daily. 03/27/22   Mast, Man X, NP  Cyanocobalamin 2500 MCG TABS Take 2,500 mcg by mouth daily. 03/27/22   Mast, Man X, NP  docusate sodium (COLACE) 100 MG capsule Take 1-2 capsules (100-200 mg total) by mouth daily as  needed for mild constipation. 03/27/22   Mast, Man X, NP  gabapentin (NEURONTIN) 300 MG capsule Take 1 capsule (300 mg total) by mouth at bedtime. 03/27/22   Mast, Man X, NP  Golimumab 50 MG/0.5ML SOAJ Take 50 mg by mouth every 2 (two) months. Hold for the next 2 weeks per Orthopedic surgery recommendations. 03/27/22   Mast, Man X, NP  leflunomide (ARAVA) 20 MG tablet Take 1 tablet (20 mg total) by mouth daily. 03/27/22   Mast, Man X, NP  levothyroxine (SYNTHROID) 88 MCG tablet Take 1 tablet (88 mcg total) by mouth daily before breakfast. 03/27/22   Mast, Man X, NP  Multiple Vitamins-Minerals (CENTRUM ADULT PO) Take 1 tablet by mouth daily. 1 Tablet Daily.    [provider]  polyethylene glycol (MIRALAX / GLYCOLAX) 17 g packet Take 17 g by mouth daily as needed. 03/27/22   Mast, Man X, NP  torsemide (DEMADEX) 20 MG tablet TAKE 1 TO 2 TABLETS BY MOUTH DAILY AS NEEDED 04/23/22   Plotnikov, [name], MD  triamcinolone (NASACORT) 55 MCG/ACT AERO nasal inhaler Place 1 spray into the nose daily. 03/27/22   Mast, Man X, NP      Allergies    Nsaids, Aspirin, Codeine, and Nitrostat [nitroglycerin]    Review of Systems   Review of Systems  Unable to perform ROS: Mental status change  Constitutional:  Negative for fever.  HENT:  Negative for facial swelling.   Eyes:  Negative for redness.  Respiratory:  Negative for wheezing.   Gastrointestinal:  Negative for vomiting.  Musculoskeletal:  Positive for extremity weakness.  Neurological:  Positive for weakness.    Physical Exam Updated Vital Signs BP (!) 156/57 (BP Location: Right Arm)   Pulse (!) 103   Temp 99.8 F (37.7 C) (Oral)   Resp 20   Ht '5\' 3"'$  (1.6 m)   Wt 63.5 kg   SpO2 98%   BMI 24.80 kg/m  Physical Exam Vitals and nursing note reviewed. Exam conducted with a chaperone present.  Constitutional:      General: She is not in acute distress.    Appearance: Normal appearance. She is well-developed.  HENT:     Head:  Normocephalic and atraumatic.     Nose: Nose normal.     Mouth/Throat:     Mouth: Mucous membranes are moist.  Eyes:     Pupils: Pupils are equal, round, and reactive to light.  Cardiovascular:     Rate and Rhythm: Normal rate and regular rhythm.     Pulses: Normal pulses.     Heart sounds: Normal heart sounds.  Pulmonary:     Effort: Pulmonary effort is normal. No respiratory distress.     Breath sounds: Normal breath sounds.  Abdominal:     General: Bowel sounds are normal. There is no distension.     Palpations: Abdomen is soft.     Tenderness: There is no abdominal tenderness. There is no guarding or rebound.  Genitourinary:    Vagina: No vaginal discharge.  Musculoskeletal:        General: Normal range of motion.     Cervical back: Neck supple.  Skin:    General: Skin is warm and dry.     Capillary Refill: Capillary refill takes less than 2 seconds.     Findings: No erythema or rash.  Neurological:     Mental Status: She is alert.     Deep Tendon Reflexes: Reflexes normal.     Comments: Downgoing Babinski B, AO1  Psychiatric:        Mood and Affect: Mood normal.     ED Results / Procedures / Treatments   Labs (all labs ordered are listed, but only abnormal results are displayed) Results for orders placed or performed during the hospital encounter of 07/02/22  Ethanol  Result Value Ref Range   Alcohol, Ethyl (B) <10 <10 mg/dL  Protime-INR  Result Value Ref Range   Prothrombin Time 15.2 11.4 - 15.2 seconds   INR 1.2 0.8 - 1.2  APTT  Result Value Ref Range   aPTT 31 24 - 36 seconds  CBC  Result Value Ref Range   WBC 23.8 (H) 4.0 - 10.5 K/uL   RBC 3.70 (L) 3.87 - 5.11 MIL/uL   Hemoglobin 11.8 (L) 12.0 - 15.0 g/dL   HCT 33.0 (L) 36.0 - 46.0 %   MCV 89.2 80.0 - 100.0 fL   MCH 31.9 26.0 - 34.0 pg   MCHC 35.8 30.0 - 36.0 g/dL   RDW 18.6 (H) 11.5 - 15.5 %   Platelets 198 150 - 400 K/uL   nRBC 0.3 (H) 0.0 - 0.2 %  Differential  Result Value Ref Range    Neutrophils Relative % 83 %   Neutro Abs 20.1 (H) 1.7 - 7.7 K/uL   Lymphocytes Relative 4 %   Lymphs Abs 0.8 0.7 - 4.0 K/uL   Monocytes  Relative 10 %   Monocytes Absolute 2.4 (H) 0.1 - 1.0 K/uL   Eosinophils Relative 0 %   Eosinophils Absolute 0.0 0.0 - 0.5 K/uL   Basophils Relative 1 %   Basophils Absolute 0.1 0.0 - 0.1 K/uL   Immature Granulocytes 2 %   Abs Immature Granulocytes 0.40 (H) 0.00 - 0.07 K/uL  Urine rapid drug screen (hosp performed)  Result Value Ref Range   Opiates NONE DETECTED NONE DETECTED   Cocaine NONE DETECTED NONE DETECTED   Benzodiazepines NONE DETECTED NONE DETECTED   Amphetamines NONE DETECTED NONE DETECTED   Tetrahydrocannabinol NONE DETECTED NONE DETECTED   Barbiturates NONE DETECTED NONE DETECTED  Urinalysis, Routine w reflex microscopic -Urine, Catheterized  Result Value Ref Range   Color, Urine AMBER (A) YELLOW   APPearance TURBID (A) CLEAR   Specific Gravity, Urine 1.015 1.005 - 1.030   pH 5.0 5.0 - 8.0   Glucose, UA NEGATIVE NEGATIVE mg/dL   Hgb urine dipstick SMALL (A) NEGATIVE   Bilirubin Urine NEGATIVE NEGATIVE   Ketones, ur NEGATIVE NEGATIVE mg/dL   Protein, ur >=300 (A) NEGATIVE mg/dL   Nitrite NEGATIVE NEGATIVE   Leukocytes,Ua LARGE (A) NEGATIVE   RBC / HPF >50 0 - 5 RBC/hpf   WBC, UA >50 0 - 5 WBC/hpf   Bacteria, UA MANY (A) NONE SEEN   Squamous Epithelial / HPF 0-5 0 - 5 /HPF   WBC Clumps PRESENT    Mucus PRESENT   Comprehensive metabolic panel  Result Value Ref Range   Sodium 128 (L) 135 - 145 mmol/L   Potassium 4.3 3.5 - 5.1 mmol/L   Chloride 92 (L) 98 - 111 mmol/L   CO2 19 (L) 22 - 32 mmol/L   Glucose, Bld 166 (H) 70 - 99 mg/dL   BUN 94 (H) 8 - 23 mg/dL   Creatinine, Ser 4.09 (H) 0.44 - 1.00 mg/dL   Calcium 8.6 (L) 8.9 - 10.3 mg/dL   Total Protein 5.4 (L) 6.5 - 8.1 g/dL   Albumin 2.5 (L) 3.5 - 5.0 g/dL   AST 38 15 - 41 U/L   ALT 20 0 - 44 U/L   Alkaline Phosphatase 95 38 - 126 U/L   Total Bilirubin 1.0 0.3 - 1.2 mg/dL    GFR, Estimated 10 (L) >60 mL/min   Anion gap 17 (H) 5 - 15  I-stat chem 8, ED  Result Value Ref Range   Sodium 124 (L) 135 - 145 mmol/L   Potassium 5.9 (H) 3.5 - 5.1 mmol/L   Chloride 91 (L) 98 - 111 mmol/L   BUN 118 (H) 8 - 23 mg/dL   Creatinine, Ser 4.70 (H) 0.44 - 1.00 mg/dL   Glucose, Bld 168 (H) 70 - 99 mg/dL   Calcium, Ion 0.85 (LL) 1.15 - 1.40 mmol/L   TCO2 25 22 - 32 mmol/L   Hemoglobin 12.9 12.0 - 15.0 g/dL   HCT 38.0 36.0 - 46.0 %   Comment NOTIFIED PHYSICIAN    DG Chest Portable 1 View  Result Date: 07/03/2022 CLINICAL DATA:  Altered mental status EXAM: PORTABLE CHEST 1 VIEW COMPARISON:  03/06/2022 FINDINGS: Prior CABG. Heart is borderline in size. Bibasilar linear atelectasis or scarring. No effusions or edema. No acute bony abnormality. IMPRESSION: Bibasilar atelectasis or scarring. Electronically Signed   By: Rolm Baptise M.D.   On: 07/03/2022 00:16   CT HEAD WO CONTRAST (5MM)  Result Date: 07/02/2022 CLINICAL DATA:  Weakness for 2 days with left facial droop EXAM: CT  HEAD WITHOUT CONTRAST TECHNIQUE: Contiguous axial images were obtained from the base of the skull through the vertex without intravenous contrast. RADIATION DOSE REDUCTION: This exam was performed according to the departmental dose-optimization program which includes automated exposure control, adjustment of the mA and/or kV according to patient size and/or use of iterative reconstruction technique. COMPARISON:  02/06/2022 FINDINGS: Brain: No evidence of acute infarction, hemorrhage, hydrocephalus, extra-axial collection or mass lesion/mass effect. Mild atrophic changes and chronic white matter ischemic changes are noted. Vascular: No hyperdense vessel or unexpected calcification. Skull: Normal. Negative for fracture or focal lesion. Sinuses/Orbits: No acute finding. Other: None. IMPRESSION: Chronic atrophic and ischemic changes.  No acute abnormality noted. Electronically Signed   By: Inez Catalina M.D.   On:  07/02/2022 23:45  '  EKG  EKG Interpretation  Date/Time:  Sunday July 02 2022 23:13:34 EST Ventricular Rate:  103 PR Interval:  175 QRS Duration: 85 QT Interval:  315 QTC Calculation: 413 R Axis:   83 Text Interpretation: Sinus tachycardia Ventricular premature complex Confirmed by Dory Horn) on 07/03/2022 1:50:09 AM         Radiology DG Chest Portable 1 View  Result Date: 07/03/2022 CLINICAL DATA:  Altered mental status EXAM: PORTABLE CHEST 1 VIEW COMPARISON:  03/06/2022 FINDINGS: Prior CABG. Heart is borderline in size. Bibasilar linear atelectasis or scarring. No effusions or edema. No acute bony abnormality. IMPRESSION: Bibasilar atelectasis or scarring. Electronically Signed   By: Rolm Baptise M.D.   On: 07/03/2022 00:16   CT HEAD WO CONTRAST (5MM)  Result Date: 07/02/2022 CLINICAL DATA:  Weakness for 2 days with left facial droop EXAM: CT HEAD WITHOUT CONTRAST TECHNIQUE: Contiguous axial images were obtained from the base of the skull through the vertex without intravenous contrast. RADIATION DOSE REDUCTION: This exam was performed according to the departmental dose-optimization program which includes automated exposure control, adjustment of the mA and/or kV according to patient size and/or use of iterative reconstruction technique. COMPARISON:  02/06/2022 FINDINGS: Brain: No evidence of acute infarction, hemorrhage, hydrocephalus, extra-axial collection or mass lesion/mass effect. Mild atrophic changes and chronic white matter ischemic changes are noted. Vascular: No hyperdense vessel or unexpected calcification. Skull: Normal. Negative for fracture or focal lesion. Sinuses/Orbits: No acute finding. Other: None. IMPRESSION: Chronic atrophic and ischemic changes.  No acute abnormality noted. Electronically Signed   By: Inez Catalina M.D.   On: 07/02/2022 23:45    Procedures Procedures    Medications Ordered in ED Medications  0.9 %  sodium chloride infusion (has no  administration in time range)  lactated ringers infusion (has no administration in time range)  ceFEPIme (MAXIPIME) 2 g in sodium chloride 0.9 % 100 mL IVPB (has no administration in time range)  metroNIDAZOLE (FLAGYL) IVPB 500 mg (has no administration in time range)  lactated ringers bolus 1,000 mL (has no administration in time range)    And  lactated ringers bolus 1,000 mL (has no administration in time range)  vancomycin (VANCOREADY) IVPB 1250 mg/250 mL (has no administration in time range)    ED Course/ Medical Decision Making/ A&P                             Medical Decision Making Patient with weakness for unknown time at [place] home but at least 2 days   Problems Addressed: Sepsis, due to unspecified organism, unspecified whether acute organ dysfunction present [facility]):    Details: Sepsis   Amount and/or  Complexity of Data Reviewed Labs: ordered.    Details: All labs reviewed: UTI on urine.  Low sodium 128, Normal potassium 4.3, markedly elevated Bun 94, elevated creatinine 4.09 up from high 2s. Markedly elevated white count 23.8, low hemoglobin 11.8, normal platelets. Negative UDS. Normal coagulation studies  Radiology: ordered and independent interpretation performed.    Details: Negative CXR by me No ICH by me on CT ECG/medicine tests: ordered and independent interpretation performed. Decision-making details documented in ED Course.  Risk Prescription drug management. Decision regarding hospitalization. Risk Details: Sepsis with AKI.  Will need admission.      Final Clinical Impression(s) / ED Diagnoses Final diagnoses:  Sepsis without acute organ dysfunction, due to unspecified organism ([place])  Sepsis, due to unspecified organism, unspecified whether acute organ dysfunction present ([place])  Altered mental status, unspecified altered mental status type  AKI (acute kidney injury) ([place])  Elevated troponin I level    The patient appears reasonably stabilized for admission  considering the current resources, flow, and capabilities available in the ED at this time, and I doubt any other [facility] requiring further screening and/or treatment in the ED prior to admission.  Rx / DC Orders ED Discharge Orders     None         Yvanna Vidas, MD 07/03/22 0236

## 2022-07-03 NOTE — Assessment & Plan Note (Addendum)
Noted. Patient is on Eliquis as an outpatient for the indication of PE/DVT which does not qualify for dose reduction, however patient is on reduced dose of 2.5 mg BID as an outpatient; possibly related to history of GI bleeding. -Resume home dose of Eliquis 2.5 mg BID

## 2022-07-03 NOTE — Assessment & Plan Note (Addendum)
-   Continue Eliquis 

## 2022-07-03 NOTE — Assessment & Plan Note (Addendum)
Baseline serum creatinine of about 2.5.

## 2022-07-03 NOTE — Assessment & Plan Note (Addendum)
Noted. No chest pain.

## 2022-07-03 NOTE — Subjective & Objective (Addendum)
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 Royce Macadamia with [facility], hypertension, rheumatoid arthritis, hypothyroidism, history of CABG, COPD, hyperlipidemia, history of PE/DVT on Eliquis who presents to the ER today with worsening weakness over the last 3 to 4 days.  Patient and her husband [name] at the bedside.  Patient's been ill for the last 2 days.  She spent the last 2 days in bed.  She is slid out of bed the last 4 days on a daily basis requiring calls the nursing station at the [facility] (independent living) number to call some to get her off the floor.  She denies any fever.  Patient has continued to take Demadex even though she was told to stop this on her recent nephrology visit on 06/14/2022 by Dr. Royce Macadamia.  Unclear why the reason patient is still taking diuretics.  On arrival temp 99.8 heart rate 103 blood pressure 156/57 satting 98% on room air.  White count 23.8, hemoglobin 11.8, platelets of 198  Cath UA showed turbid urine, large leukocyte esterase, greater than 50 WBCs, many bacteria.  Sodium 128, potassium 4.3, bicarb of 19, BUN of 94, creatinine 4.09  Chest x-ray showed basilar scarring.  EMS reported that they noticed a left-sided facial droop and left arm grip weakness.  Last unknown well.  However on talking with the patient, she states that this has resolved.  [facility] hospitalist contacted for admission.  Today is her 82th birthday.

## 2022-07-03 NOTE — H&P (Signed)
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 Royce Macadamia with [facility], hypertension, rheumatoid arthritis, hypothyroidism, history of CABG, COPD, hyperlipidemia, history of PE/DVT on Eliquis who presents to the ER today with worsening weakness over the last 3 to 4 days.  Patient and her husband [name] at the bedside.  Patient's been ill for the last 2 days.  She spent the last 2 days in bed.  She is slid out of bed the last 4 days on a daily basis requiring calls the nursing station at the [facility] (independent living) number to call some to get her off the floor.  She denies any fever.  Patient has continued to take Demadex even though she was told to stop this on her recent nephrology visit on 06/14/2022 by Dr. Royce Macadamia.  Unclear why the reason patient is still taking diuretics.  On arrival temp 99.8 heart rate 103 blood pressure 156/57 satting 98% on room air.  White count 23.8, hemoglobin 11.8, platelets of 198  Cath UA showed turbid urine, large leukocyte esterase, greater than 50 WBCs, many bacteria.  Sodium 128, potassium 4.3, bicarb of 19, BUN of 94, creatinine 4.09  Chest x-ray showed basilar scarring.  EMS reported that they noticed a left-sided facial droop and left arm grip weakness.  Last unknown well.  However on talking with the patient, she states that this has resolved.  [facility] hospitalist contacted for admission.  Today is her 82th birthday.   ED Course: WBC 23.8, Scr 4.09  Review of Systems:  Review of Systems  Constitutional:  Positive for malaise/fatigue.  HENT: Negative.    Eyes: Negative.   Respiratory:  Negative.    Cardiovascular: Negative.   Gastrointestinal: Negative.   Genitourinary: Negative.   Musculoskeletal:  Positive for falls.  Skin: Negative.   Neurological:  Positive for weakness.  Endo/Heme/Allergies: Negative.   Psychiatric/Behavioral: Negative.    All other systems reviewed and are negative.   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 Most likely scar. Mild subendocardial ischemia. EF 71% LOW RISK.    Cervical disc disease    fracture   Chronic kidney disease    COPD (chronic obstructive pulmonary disease) (HCC)    Diverticulosis    DVT (deep venous thrombosis) (HCC)    RLE; Sept '23   History of DVT (deep vein thrombosis) 03/06/2022   History of pulmonary embolus (PE)  Oct '23   Hyperlipidemia    Hypertension    Hypothyroidism (acquired)    hypo   Myocardial infarction [facility]) 11/2014   TIA   Polymyalgia rheumatica ([place])    2011 Dr. Charlestine Night   Pulmonary emboli [facility]) 02/06/2022   01/2022 PE/DVT post-op. Lower GI bleed     Treat edema  Start Eliquis at the reduced dose due to Creat>1.5 and age >45   Rheumatoid arthritis ([place]) 2011   Dr.Truslow; fracture knees, hands and wrists -    S/P CABG x 1 1996   CABG--LIMA-LAD for ostial LAD (not felt to be PCI amenable). EF NORMAL then; LIMA now atretic    Shortness of breath dyspnea    with exertion   Stroke ([place]) 11/2014   TIA    Urinary frequency     Past Surgical History:  Procedure Laterality Date   ABDOMINAL AORTIC ANEURYSM REPAIR  [id number]   Complicated by mesenteric artery stenosis and splenic artery infarction with acquired Asplenia   APPENDECTOMY     BUNIONECTOMY  07/2011   right foot   CARDIAC CATHETERIZATION  2005   (Most recent CATH) - ostial LAD lesion 20-30% (down from 90% initially). Atretic LIMA. Minimal disease the RCA and Circumflex system.   CARPAL TUNNEL RELEASE Left    CATARACT EXTRACTION Bilateral    CERVICAL SPINE SURGERY     plate 2008 Dr. Saintclair Halsted   COLONOSCOPY     CORONARY ARTERY BYPASS GRAFT  1996   INCLUDED AN INTERNAL MAMMARY ARTERY TO THE LAD. EF WAS NORMAL   INGUINAL HERNIA REPAIR Right    LAPAROSCOPIC APPENDECTOMY N/A 06/28/2016   Procedure: APPENDECTOMY LAPAROSCOPIC;  Surgeon: Leighton Ruff, MD;  Location: [facility];  Service: General;  Laterality: N/A;   NM MYOVIEW LTD  06/2010   Fixed anteroseptal, apical and inferoapical defect with moderate size. Most likely scar. Mild subendocardial ischemia. EF 71% LOW RISK.    SPLENECTOMY     TOTAL HIP ARTHROPLASTY Left 01/23/2022   Procedure: LEFT TOTAL HIP ARTHROPLASTY ANTERIOR APPROACH;  Surgeon: Leandrew Koyanagi, MD;  Location: [place];  Service: Orthopedics;  Laterality: Left;  3-C   TRANSTHORACIC ECHOCARDIOGRAM  12/2014   [facility]: Normal LV size & function. EF 55-60%,    vagina polyp     VIDEO ASSISTED THORACOSCOPY (VATS)/WEDGE RESECTION Left 03/02/2015   Procedure: VIDEO ASSISTED THORACOSCOPY (VATS), MINI THORACOTOMY, LEFT UPPER LOBE WEDGE, TAKE DOWN OF INTERNAL MAMMARY LESIONS, PLACEMENT OF ON-Q PUMP;  Surgeon: Grace Isaac, MD;  Location: [place];  Service: Thoracic;  Laterality: Left;   VIDEO BRONCHOSCOPY N/A 03/02/2015   Procedure: BRONCHOSCOPY;  Surgeon: Grace Isaac, MD;  Location: [place];  Service: Thoracic;  Laterality: N/A;   VIDEO  BRONCHOSCOPY WITH ENDOBRONCHIAL NAVIGATION N/A 10/08/2017   Procedure: VIDEO BRONCHOSCOPY WITH ENDOBRONCHIAL NAVIGATION WITH BIOPSIES OF LEFT UPPER LOBE AND LEFT LOWER LOBE;  Surgeon: Grace Isaac, MD;  Location: [place];  Service: Thoracic;  Laterality: N/A;     reports that she quit smoking about 63 years ago. Her smoking use included cigarettes. She has never been exposed to tobacco smoke. She has never used smokeless tobacco. She reports current alcohol use. She reports that she does not use drugs.  Allergies  Allergen Reactions   Nsaids Other (See Comments)    Stomach upset Told to avoid due to Eliquis   Aspirin Other (See Comments)    Stomach upset Told to avoid due to Eliquis   Codeine Nausea And Vomiting  Nitrostat [Nitroglycerin] Other (See Comments)    Bradycardia. Drop in heart rate     Family History  Problem Relation Age of Onset   Hypertension Mother    Diabetes Mother    Heart disease Mother    Hyperlipidemia Mother    Stroke Father    Hyperlipidemia Sister    Hypertension Sister    Hypertension Daughter    Cancer Paternal Uncle        Deceased from cancer not sure of site   Hypertension Son    Hyperlipidemia Son    Hyperlipidemia Son    Hypertension Son    Hyperlipidemia Son    Hypertension Son    Hypertension Other    Coronary artery disease Other    Asthma Neg Hx    Colon cancer Neg Hx     Prior to Admission medications   Medication Sig Start Date End Date Taking? Authorizing Provider  acetaminophen (TYLENOL) 325 MG tablet Take 2 tablets (650 mg total) by mouth 2 (two) times daily as needed (generalized pain). 03/27/22  Yes Mast, Man X, NP  albuterol (VENTOLIN HFA) 108 (90 Base) MCG/ACT inhaler Inhale 1-2 puffs into the lungs every 4 (four) hours as needed for wheezing or shortness of breath. 03/27/22  Yes Mast, Man X, NP  apixaban (ELIQUIS) 2.5 MG TABS tablet Take 1 tablet (2.5 mg total) by mouth 2 (two) times daily. 04/28/22  Yes Plotnikov,  [name], MD  ascorbic acid (VITAMIN C) 500 MG tablet Take 1 tablet (500 mg total) by mouth daily. 03/27/22  Yes Mast, Man X, NP  atorvastatin (LIPITOR) 80 MG tablet Take 1 tablet (80 mg total) by mouth daily. 03/27/22  Yes Mast, Man X, NP  carvedilol (COREG) 3.125 MG tablet Take 1 tablet (3.125 mg total) by mouth 2 (two) times daily with a meal. 05/23/22  Yes Plotnikov, [name], MD  Cyanocobalamin 2500 MCG TABS Take 2,500 mcg by mouth daily. 03/27/22  Yes Mast, Man X, NP  docusate sodium (COLACE) 100 MG capsule Take 1-2 capsules (100-200 mg total) by mouth daily as needed for mild constipation. 03/27/22  Yes Mast, Man X, NP  Golimumab 50 MG/0.5ML SOAJ Take 50 mg by mouth every 2 (two) months. Hold for the next 2 weeks per Orthopedic surgery recommendations. 03/27/22  Yes Mast, Man X, NP  leflunomide (ARAVA) 20 MG tablet Take 1 tablet (20 mg total) by mouth daily. 03/27/22  Yes Mast, Man X, NP  levothyroxine (SYNTHROID) 88 MCG tablet Take 1 tablet (88 mcg total) by mouth daily before breakfast. 03/27/22  Yes Mast, Man X, NP  Multiple Vitamins-Minerals (CENTRUM ADULT PO) Take 1 tablet by mouth daily. 1 Tablet Daily.   Yes [provider]  polyethylene glycol (MIRALAX / GLYCOLAX) 17 g packet Take 17 g by mouth daily as needed. Patient taking differently: Take 17 g by mouth daily as needed for mild constipation. 03/27/22  Yes Mast, Man X, NP  torsemide (DEMADEX) 20 MG tablet TAKE 1 TO 2 TABLETS BY MOUTH DAILY AS NEEDED Patient taking differently: Take 20 mg by mouth daily. 04/23/22  Yes Plotnikov, [name], MD  triamcinolone (NASACORT) 55 MCG/ACT AERO nasal inhaler Place 1 spray into the nose daily. 03/27/22  Yes Mast, Man X, NP  Cholecalciferol (VITAMIN D3) 50 MCG (2000 UT) capsule Take 1 capsule (2,000 Units total) by mouth daily. 03/27/22   Mast, Man X, NP  gabapentin (NEURONTIN) 300 MG capsule Take 1 capsule (300 mg total) by mouth at bedtime. 03/27/22  Mast, Man X, NP    Physical  Exam: Vitals:   07/02/22 2309 07/02/22 2314  BP:  (!) 156/57  Pulse:  (!) 103  Resp:  20  Temp:  99.8 F (37.7 C)  TempSrc:  Oral  SpO2:  98%  Weight: 63.5 kg   Height: '5\' 3"'$  (1.6 m)     Physical Exam Vitals and nursing note reviewed.  Constitutional:      General: She is not in acute distress.    Appearance: She is not toxic-appearing or diaphoretic.     Comments: Appears chronically ill.  HENT:     Head: Normocephalic and atraumatic.     Nose: Nose normal.  Eyes:     General: No scleral icterus. Cardiovascular:     Rate and Rhythm: Normal rate and regular rhythm.     Pulses: Normal pulses.  Pulmonary:     Effort: Pulmonary effort is normal.     Breath sounds: Normal breath sounds.  Abdominal:     General: Bowel sounds are normal. There is no distension.     Tenderness: There is no abdominal tenderness. There is no guarding.  Musculoskeletal:     Right lower leg: No edema.     Left lower leg: No edema.  Skin:    General: Skin is warm and dry.     Capillary Refill: Capillary refill takes less than 2 seconds.  Neurological:     General: No focal deficit present.     Mental Status: She is oriented to person, place, and time.      Labs on Admission: I have personally reviewed following labs and imaging studies  CBC: Recent Labs  Lab 07/02/22 2329 07/02/22 2337  WBC 23.8*  --   NEUTROABS 20.1*  --   HGB 11.8* 12.9  HCT 33.0* 38.0  MCV 89.2  --   PLT 198  --    Basic Metabolic Panel: Recent Labs  Lab 07/02/22 2337 07/03/22 0033  NA 124* 128*  K 5.9* 4.3  CL 91* 92*  CO2  --  19*  GLUCOSE 168* 166*  BUN 118* 94*  CREATININE 4.70* 4.09*  CALCIUM  --  8.6*   GFR: Estimated Creatinine Clearance: 9.2 mL/min (A) (by C-G formula based on SCr of 4.09 mg/dL (H)). Liver Function Tests: Recent Labs  Lab 07/03/22 0033  AST 38  ALT 20  ALKPHOS 95  BILITOT 1.0  PROT 5.4*  ALBUMIN 2.5*   No results for input(s): "LIPASE", "AMYLASE" in the last 168  hours. No results for input(s): "AMMONIA" in the last 168 hours. Coagulation Profile: Recent Labs  Lab 07/02/22 2329  INR 1.2   Cardiac Enzymes: No results for input(s): "CKTOTAL", "CKMB", "CKMBINDEX", "TROPONINI", "TROPONINIHS" in the last 168 hours. BNP (last 3 results) Recent Labs    03/07/22 0310 03/08/22 0650 03/09/22 0540  BNP 467.8* 371.8* 265.5*   HbA1C: No results for input(s): "HGBA1C" in the last 72 hours. CBG: No results for input(s): "GLUCAP" in the last 168 hours. Lipid Profile: No results for input(s): "CHOL", "HDL", "LDLCALC", "TRIG", "CHOLHDL", "LDLDIRECT" in the last 72 hours. Thyroid Function Tests: No results for input(s): "TSH", "T4TOTAL", "FREET4", "T3FREE", "THYROIDAB" in the last 72 hours. Anemia Panel: No results for input(s): "VITAMINB12", "FOLATE", "FERRITIN", "TIBC", "IRON", "RETICCTPCT" in the last 72 hours. Urine analysis:    Component Value Date/Time   COLORURINE AMBER (A) 07/02/2022 2329   APPEARANCEUR TURBID (A) 07/02/2022 2329   LABSPEC 1.015 07/02/2022 2329   PHURINE 5.0 07/02/2022  [place] 07/02/2022 2329   GLUCOSEU NEGATIVE 11/14/2018 1508   HGBUR SMALL (A) 07/02/2022 2329   BILIRUBINUR NEGATIVE 07/02/2022 2329   KETONESUR NEGATIVE 07/02/2022 2329   PROTEINUR >=300 (A) 07/02/2022 2329   UROBILINOGEN 0.2 11/14/2018 1508   NITRITE NEGATIVE 07/02/2022 2329   LEUKOCYTESUR LARGE (A) 07/02/2022 2329    Radiological Exams on Admission: I have personally reviewed images DG Chest Portable 1 View  Result Date: 07/03/2022 CLINICAL DATA:  Altered mental status EXAM: PORTABLE CHEST 1 VIEW COMPARISON:  03/06/2022 FINDINGS: Prior CABG. Heart is borderline in size. Bibasilar linear atelectasis or scarring. No effusions or edema. No acute bony abnormality. IMPRESSION: Bibasilar atelectasis or scarring. Electronically Signed   By: Rolm Baptise M.D.   On: 07/03/2022 00:16   CT HEAD WO CONTRAST (5MM)  Result Date: 07/02/2022 CLINICAL  DATA:  Weakness for 2 days with left facial droop EXAM: CT HEAD WITHOUT CONTRAST TECHNIQUE: Contiguous axial images were obtained from the base of the skull through the vertex without intravenous contrast. RADIATION DOSE REDUCTION: This exam was performed according to the departmental dose-optimization program which includes automated exposure control, adjustment of the mA and/or kV according to patient size and/or use of iterative reconstruction technique. COMPARISON:  02/06/2022 FINDINGS: Brain: No evidence of acute infarction, hemorrhage, hydrocephalus, extra-axial collection or mass lesion/mass effect. Mild atrophic changes and chronic white matter ischemic changes are noted. Vascular: No hyperdense vessel or unexpected calcification. Skull: Normal. Negative for fracture or focal lesion. Sinuses/Orbits: No acute finding. Other: None. IMPRESSION: Chronic atrophic and ischemic changes.  No acute abnormality noted. Electronically Signed   By: Inez Catalina M.D.   On: 07/02/2022 23:45    EKG: My personal interpretation of EKG shows: sinus tachycardia    Assessment/Plan Principal Problem:   Acute cystitis without hematuria Active Problems:   Acute renal failure superimposed on stage 4 chronic kidney disease (HCC)   Acute metabolic encephalopathy   Hyponatremia   Sepsis with acute renal failure (HCC)   Acquired hypothyroidism   Essential hypertension   Rheumatoid arthritis ([place])   Long term (current) use of anticoagulants   Hx of CABG   Hyperlipidemia   COPD (chronic obstructive pulmonary disease) (HCC)   CKD (chronic kidney disease) stage 4, GFR 15-29 ml/min (HCC) - baseline SCr 2.5. follows with Dr. Royce Macadamia with [facility]   History of pulmonary embolism   DNR (do not resuscitate)/DNI(Do Not Intubate)    Assessment and Plan: * Acute cystitis without hematuria Admit to med/surg bed. IV rocephin. Urine culture ordered.  Sepsis with acute renal failure (HCC) Fulfills sepsis criteria.  HR 103, WBC 23.8k, Scr 4.0.9. UA with large LE and many bacteria  Hyponatremia May be due to dehydration. Continue with IVF. Pt already given lots of NS prior to seeing patient. This would alter her serum osm, urine osm, urine electrolytes.  Acute metabolic encephalopathy Likely due to ARF, acute cystitis. Pt has been feeling very tired. Has spent the last 36 hours lying in bed. Husband states pt has slid out of bed on a daily basis for the last 3-4 days. Requiring calls to nurse station for help getting up. Pt and husband live at [facility] in independent living.  Acute renal failure superimposed on stage 4 chronic kidney disease (HCC) Baseline Scr 2.4. follows with Dr. Royce Macadamia with [facility]. Continue with IVF. Pt was still taking demadex at home. Recent nephrology notes from 06-14-2022 says that pt had stopped diuretics. Apparently this is not true.  Possible urinary retention at play. Pt taking Bid Chlorpheniramine which could be causing urinary retention. Check bladder scan.  History of pulmonary embolism Continue eliquis.  CKD (chronic kidney disease) stage 4, GFR 15-29 ml/min (HCC) - baseline SCr 2.5. follows with Dr. Royce Macadamia with [facility] Baseline Scr 2.5. now acutely worsened due to continued diuretics use, UTI  COPD (chronic obstructive pulmonary disease) ([place]) Stable.  Hyperlipidemia On lipitor 20 mg daily.  Hx of CABG Stable.  Long term (current) use of anticoagulants On Eliquis for hx of PE/DVT. On reduced dose due to renal failure.  Rheumatoid arthritis ([place]) Stable. Hold arava in face of infection/UTI.  Essential hypertension Continue with IVF. Hold coreg for now. Stop all diuretics.  Acquired hypothyroidism Stable. On synthyroid 88 mcg daily. TSH normal 4 months ago.  DNR (do not resuscitate)/DNI(Do Not Intubate) Verified DNR/DNI status with the patient.  Husband agrees with this.   DVT prophylaxis: Eliquis Code Status: DNR/DNI(Do NOT Intubate).  Verified with pt and husband [name] Family Communication: discussed at bedside with pt and husband [name]  Disposition Plan: return home  Consults called: none  Admission status: Inpatient, Med-Surg   Kristopher Oppenheim, DO [facility] 07/03/2022, 2:26 AM

## 2022-07-03 NOTE — Assessment & Plan Note (Addendum)
Patient is on [place] as an outpatient which is held secondary to active infection.

## 2022-07-03 NOTE — Assessment & Plan Note (Addendum)
Patient is managed on Coreg as an outpatient. Uncontrolled while admitted. -Resume home Coreg -Add hydralazine PO PRN; may need to add second antihypertensive

## 2022-07-03 NOTE — ED Notes (Signed)
ED TO INPATIENT HANDOFF REPORT  ED Nurse Name and Phone #:  Jaramiah Bossard [phone]  S Name/Age/Gender [name] 82 y.o. female Room/Bed: 006C/006C  Code Status   Code Status: DNR  Home/SNF/Other [facility] Patient oriented to: self, place, and situation Is this baseline? No   Triage Complete: Triage complete  Chief Complaint Acute cystitis without hematuria [N30.00]  Triage Note BIB EMS from [place]. Living. pt feels weak x2 days. Upon EMS arrival noticed left sided facial droop and left arm grip weakness. Unknown LKW. Usually ambulatory with walker, has been sliding out of chair.    Allergies Allergies  Allergen Reactions   Nsaids Other (See Comments)    Stomach upset Told to avoid due to Eliquis   Aspirin Other (See Comments)    Stomach upset Told to avoid due to Eliquis   Codeine Nausea And Vomiting   Nitrostat [Nitroglycerin] Other (See Comments)    Bradycardia. Drop in heart rate     Level of Care/Admitting Diagnosis ED Disposition     ED Disposition  Admit   Condition  --   [place]: [place] [[id number]]  Level of Care: Med-Surg [16]  May admit patient to [facility] or [facility] if equivalent level of care is available:: No  Covid Evaluation: Asymptomatic - no recent exposure (last 10 days) testing not required  Diagnosis: Acute cystitis without hematuria [id number]  Admitting Physician: Bridgett Larsson, [place]  Attending Physician: Bridgett Larsson, ERIC [id number]  Certification:: I certify this patient will need inpatient services for at least 2 midnights  Estimated Length of Stay: 4          B Medical/Surgery History Past Medical History:  Diagnosis Date   Abdominal aortic aneurysm ([place])    REPAIRED IN 1996 BY DR [place] BY [name]   Acquired asplenia     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 Most likely scar. Mild subendocardial ischemia. EF 71% LOW RISK.    Cervical disc disease    fracture   Chronic kidney disease    COPD (chronic obstructive pulmonary disease) (HCC)    Diverticulosis    DVT (deep venous thrombosis) ([place])    RLE; Sept '23   History of DVT (deep vein thrombosis) 03/06/2022   History of pulmonary embolus (PE)    Oct '23   Hyperlipidemia    Hypertension    Hypothyroidism (acquired)    hypo   Myocardial infarction ([place]) 11/2014   TIA   Polymyalgia rheumatica ([place])    2011 Dr. Charlestine Night   Pulmonary emboli [facility]) 02/06/2022   01/2022 PE/DVT post-op. Lower GI bleed     Treat edema  Start Eliquis at the reduced dose due to Creat>1.5 and age >28   Rheumatoid arthritis ([place]) 2011   Dr.Truslow; fracture knees, hands and wrists -    S/P CABG x 1 1996   CABG--LIMA-LAD  for ostial LAD (not felt to be PCI amenable). EF NORMAL then; LIMA now atretic   Shortness of breath dyspnea    with exertion   Stroke ([place]) 11/2014   TIA    Urinary frequency    Past Surgical History:  Procedure Laterality Date   ABDOMINAL AORTIC ANEURYSM REPAIR  [id number]   Complicated by mesenteric artery stenosis and splenic artery infarction with acquired Asplenia   APPENDECTOMY     BUNIONECTOMY  07/2011   right foot   CARDIAC CATHETERIZATION  2005   (Most recent CATH) - ostial LAD lesion 20-30% (down from 90% initially). Atretic LIMA. Minimal disease the RCA and Circumflex system.   CARPAL TUNNEL RELEASE Left    CATARACT EXTRACTION Bilateral    CERVICAL SPINE  SURGERY     plate 2008 Dr. Saintclair Halsted   COLONOSCOPY     CORONARY ARTERY BYPASS GRAFT  1996   INCLUDED AN INTERNAL MAMMARY ARTERY TO THE LAD. EF WAS NORMAL   INGUINAL HERNIA REPAIR Right    LAPAROSCOPIC APPENDECTOMY N/A 06/28/2016   Procedure: APPENDECTOMY LAPAROSCOPIC;  Surgeon: Leighton Ruff, MD;  Location: [facility];  Service: General;  Laterality: N/A;   NM MYOVIEW LTD  06/2010   Fixed anteroseptal, apical and inferoapical defect with moderate size. Most likely scar. Mild subendocardial ischemia. EF 71% LOW RISK.    SPLENECTOMY     TOTAL HIP ARTHROPLASTY Left 01/23/2022   Procedure: LEFT TOTAL HIP ARTHROPLASTY ANTERIOR APPROACH;  Surgeon: Leandrew Koyanagi, MD;  Location: [facility];  Service: Orthopedics;  Laterality: Left;  3-C   TRANSTHORACIC ECHOCARDIOGRAM  12/2014   [facility]: Normal LV size & function. EF 55-60%,    vagina polyp     VIDEO ASSISTED THORACOSCOPY (VATS)/WEDGE RESECTION Left 03/02/2015   Procedure: VIDEO ASSISTED THORACOSCOPY (VATS), MINI THORACOTOMY, LEFT UPPER LOBE WEDGE, TAKE DOWN OF INTERNAL MAMMARY LESIONS, PLACEMENT OF ON-Q PUMP;  Surgeon: Grace Isaac, MD;  Location: [facility];  Service: Thoracic;  Laterality: Left;   VIDEO BRONCHOSCOPY N/A 03/02/2015   Procedure: BRONCHOSCOPY;  Surgeon: Grace Isaac, MD;  Location: [facility];  Service: Thoracic;  Laterality: N/A;   VIDEO BRONCHOSCOPY WITH ENDOBRONCHIAL NAVIGATION N/A 10/08/2017   Procedure: VIDEO BRONCHOSCOPY WITH ENDOBRONCHIAL NAVIGATION WITH BIOPSIES OF LEFT UPPER LOBE AND LEFT LOWER LOBE;  Surgeon: Grace Isaac, MD;  Location: [facility];  Service: Thoracic;  Laterality: N/A;     A IV Location/Drains/Wounds Patient Lines/Drains/Airways Status     Active Line/Drains/Airways     Name Placement date Placement time Site Days   Peripheral IV 07/03/22 22 G Posterior;Right Forearm 07/03/22  --  Forearm  less than 1   Peripheral IV 07/03/22 Anterior;Right Forearm 07/03/22  0246  Forearm  less than 1   Wound /  Incision (Open or Dehisced) 02/17/22 Irritant Dermatitis (Moisture Associated Skin Damage) Perineum Bilateral 02/17/22  0130  Perineum  136            Intake/Output Last 24 hours  Intake/Output Summary (Last 24 hours) at 07/03/2022 1319 Last data filed at 07/03/2022 0448 Gross per 24 hour  Intake 200 ml  Output --  Net 200 ml    Labs/Imaging Results for orders placed or performed during the hospital encounter of 07/02/22 (from the past 48 hour(s))  Resp panel by RT-PCR (RSV, Flu A&B, Covid) Peripheral     Status: None   Collection Time: 07/02/22 11:00 PM   Specimen: Peripheral; Nasal Swab  Result Value Ref Range   SARS Coronavirus  2 by RT PCR NEGATIVE NEGATIVE   Influenza A by PCR NEGATIVE NEGATIVE   Influenza B by PCR NEGATIVE NEGATIVE    Comment: (NOTE) The Xpert Xpress SARS-CoV-2/FLU/RSV plus assay is intended as an aid in the diagnosis of influenza from Nasopharyngeal swab specimens and should not be used as a sole basis for treatment. Nasal washings and aspirates are unacceptable for Xpert Xpress SARS-CoV-2/FLU/RSV testing.  Fact Sheet for Patients: EntrepreneurPulse.com.au  Fact Sheet for Healthcare Providers: IncredibleEmployment.be  This test is not yet approved or cleared by the [place] FDA and has been authorized for detection and/or diagnosis of SARS-CoV-2 by FDA under an Emergency Use Authorization (EUA). This EUA will remain in effect (meaning this test can be used) for the duration of the COVID-19 declaration under Section 564(b)(1) of the Act, 21 U.S.C. section 360bbb-3(b)(1), unless the authorization is terminated or revoked.     Resp Syncytial Virus by PCR NEGATIVE NEGATIVE    Comment: (NOTE) Fact Sheet for Patients: EntrepreneurPulse.com.au  Fact Sheet for Healthcare Providers: IncredibleEmployment.be  This test is not yet approved or cleared by the [place] FDA  and has been authorized for detection and/or diagnosis of SARS-CoV-2 by FDA under an Emergency Use Authorization (EUA). This EUA will remain in effect (meaning this test can be used) for the duration of the COVID-19 declaration under Section 564(b)(1) of the Act, 21 U.S.C. section 360bbb-3(b)(1), unless the authorization is terminated or revoked.  Performed at [facility], [place] [address] [place], [place] [address]   Blood Culture (routine x 2)     Status: None (Preliminary result)   Collection Time: 07/02/22 11:00 PM   Specimen: BLOOD  Result Value Ref Range   Specimen Description BLOOD RIGHT ARM    Special Requests      BOTTLES DRAWN AEROBIC AND ANAEROBIC Blood Culture adequate volume   Culture      NO GROWTH < 12 HOURS Performed at [facility], [place] [address], [place], [place] [address]    Report Status PENDING   Protime-INR     Status: None   Collection Time: 07/02/22 11:29 PM  Result Value Ref Range   Prothrombin Time 15.2 11.4 - 15.2 seconds   INR 1.2 0.8 - 1.2    Comment: (NOTE) INR goal varies based on device and disease states. Performed at [facility], [place] [address] [place], [place] [address]   APTT     Status: None   Collection Time: 07/02/22 11:29 PM  Result Value Ref Range   aPTT 31 24 - 36 seconds    Comment: Performed at [facility] [address] [place], [place] [address]  CBC     Status: Abnormal   Collection Time: 07/02/22 11:29 PM  Result Value Ref Range   WBC 23.8 (H) 4.0 - 10.5 K/uL   RBC 3.70 (L) 3.87 - 5.11 MIL/uL   Hemoglobin 11.8 (L) 12.0 - 15.0 g/dL   HCT 33.0 (L) 36.0 - 46.0 %   MCV 89.2 80.0 - 100.0 fL   MCH 31.9 26.0 - 34.0 pg   MCHC 35.8 30.0 - 36.0 g/dL   RDW 18.6 (H) 11.5 - 15.5 %   Platelets 198 150 - 400 K/uL   nRBC 0.3 (H) 0.0 - 0.2 %    Comment: Performed at [facility] [address] [place], [place] [address]  Differential     Status: Abnormal   Collection Time: 07/02/22 11:29 PM  Result Value Ref Range   Neutrophils Relative % 83 %   Neutro Abs 20.1 (H) 1.7 - 7.7 K/uL   Lymphocytes Relative 4 %   Lymphs Abs 0.8 0.7 - 4.0 K/uL   Monocytes Relative 10 %   Monocytes Absolute 2.4 (H) 0.1 - 1.0 K/uL   Eosinophils Relative 0 %   Eosinophils Absolute 0.0 0.0 - 0.5 K/uL   Basophils Relative 1 %   Basophils Absolute 0.1 0.0 - 0.1 K/uL   Immature Granulocytes 2 %   Abs Immature Granulocytes 0.40 (H) 0.00 - 0.07 K/uL    Comment: Performed at [facility] [address] [place], [place] [address]  Urine rapid drug screen (hosp performed)     Status: None   Collection Time: 07/02/22 11:29 PM  Result Value Ref Range   Opiates NONE DETECTED NONE DETECTED   Cocaine NONE DETECTED NONE DETECTED   Benzodiazepines NONE DETECTED NONE DETECTED   Amphetamines NONE DETECTED NONE DETECTED   Tetrahydrocannabinol NONE DETECTED NONE DETECTED   Barbiturates NONE DETECTED NONE DETECTED    Comment: (NOTE) DRUG SCREEN FOR MEDICAL PURPOSES ONLY.  IF CONFIRMATION IS NEEDED FOR ANY PURPOSE, NOTIFY LAB WITHIN 5 DAYS.  LOWEST DETECTABLE LIMITS FOR URINE DRUG SCREEN Drug Class                     Cutoff (ng/mL) Amphetamine and metabolites    1000 Barbiturate and metabolites    200 Benzodiazepine                 200 Opiates and metabolites        300 Cocaine and metabolites        300 THC                            50 Performed at [facility], [place] [address] [place], [place] [address]   Urinalysis, Routine w reflex microscopic -Urine, Catheterized     Status: Abnormal   Collection Time: 07/02/22 11:29 PM  Result Value Ref Range   Color, Urine AMBER (A) YELLOW    Comment: BIOCHEMICALS MAY BE AFFECTED BY COLOR   APPearance TURBID (A) CLEAR   Specific Gravity, Urine 1.015 1.005 - 1.030   pH 5.0 5.0 - 8.0   Glucose, UA NEGATIVE NEGATIVE mg/dL   Hgb urine dipstick SMALL (A) NEGATIVE   Bilirubin Urine NEGATIVE NEGATIVE   Ketones, ur NEGATIVE NEGATIVE mg/dL    Protein, ur >=300 (A) NEGATIVE mg/dL   Nitrite NEGATIVE NEGATIVE   Leukocytes,Ua LARGE (A) NEGATIVE   RBC / HPF >50 0 - 5 RBC/hpf   WBC, UA >50 0 - 5 WBC/hpf   Bacteria, UA MANY (A) NONE SEEN   Squamous Epithelial / HPF 0-5 0 - 5 /HPF   WBC Clumps PRESENT    Mucus PRESENT     Comment: Performed at [facility], [address], [place], [place] [address]  I-stat chem 8, ED     Status: Abnormal   Collection Time: 07/02/22 11:37 PM  Result Value Ref Range   Sodium 124 (L) 135 - 145 mmol/L   Potassium 5.9 (H) 3.5 - 5.1 mmol/L   Chloride 91 (L) 98 - 111 mmol/L   BUN 118 (H) 8 - 23 mg/dL   Creatinine, Ser 4.70 (H) 0.44 - 1.00 mg/dL   Glucose, Bld 168 (H) 70 - 99 mg/dL    Comment: Glucose reference  range applies only to samples taken after fasting for at least 8 hours.   Calcium, Ion 0.85 (LL) 1.15 - 1.40 mmol/L   TCO2 25 22 - 32 mmol/L   Hemoglobin 12.9 12.0 - 15.0 g/dL   HCT 38.0 36.0 - 46.0 %   Comment NOTIFIED PHYSICIAN   Ethanol     Status: None   Collection Time: 07/03/22 12:33 AM  Result Value Ref Range   Alcohol, Ethyl (B) <10 <10 mg/dL    Comment: (NOTE) Lowest detectable limit for serum alcohol is 10 mg/dL.  For medical purposes only. Performed at [facility], [place] [address], [place], [place] [address]   Comprehensive metabolic panel     Status: Abnormal   Collection Time: 07/03/22 12:33 AM  Result Value Ref Range   Sodium 128 (L) 135 - 145 mmol/L   Potassium 4.3 3.5 - 5.1 mmol/L    Comment: HEMOLYSIS AT THIS LEVEL MAY AFFECT RESULT   Chloride 92 (L) 98 - 111 mmol/L   CO2 19 (L) 22 - 32 mmol/L   Glucose, Bld 166 (H) 70 - 99 mg/dL    Comment: Glucose reference range applies only to samples taken after fasting for at least 8 hours.   BUN 94 (H) 8 - 23 mg/dL   Creatinine, Ser 4.09 (H) 0.44 - 1.00 mg/dL   Calcium 8.6 (L) 8.9 - 10.3 mg/dL   Total Protein 5.4 (L) 6.5 - 8.1 g/dL   Albumin 2.5 (L) 3.5 - 5.0 g/dL   AST 38 15 - 41 U/L    Comment: HEMOLYSIS AT  THIS LEVEL MAY AFFECT RESULT   ALT 20 0 - 44 U/L    Comment: HEMOLYSIS AT THIS LEVEL MAY AFFECT RESULT   Alkaline Phosphatase 95 38 - 126 U/L   Total Bilirubin 1.0 0.3 - 1.2 mg/dL    Comment: HEMOLYSIS AT THIS LEVEL MAY AFFECT RESULT   GFR, Estimated 10 (L) >60 mL/min    Comment: (NOTE) Calculated using the CKD-EPI Creatinine Equation (2021)    Anion gap 17 (H) 5 - 15    Comment: Performed at [facility], [place] [address] [place], [place] [address]  Blood Culture (routine x 2)     Status: None (Preliminary result)   Collection Time: 07/03/22  2:20 AM   Specimen: BLOOD  Result Value Ref Range   Specimen Description BLOOD LEFT ARM    Special Requests      BOTTLES DRAWN AEROBIC AND ANAEROBIC Blood Culture adequate volume   Culture      NO GROWTH < 12 HOURS Performed at [facility], [place] [address] [place], [place] [address]    Report Status PENDING   Lactic acid, plasma     Status: None   Collection Time: 07/03/22  2:30 AM  Result Value Ref Range   Lactic Acid, Venous 1.5 0.5 - 1.9 mmol/L    Comment: Performed at [facility], [place] [address] [place], [place] [address]  POC CBG, ED     Status: Abnormal   Collection Time: 07/03/22  6:32 AM  Result Value Ref Range   Glucose-Capillary 136 (H) 70 - 99 mg/dL    Comment: Glucose reference range applies only to samples taken after fasting for at least 8 hours.   DG Chest Portable 1 View  Result Date: 07/03/2022 CLINICAL DATA:  Altered mental status EXAM: PORTABLE CHEST 1 VIEW COMPARISON:  03/06/2022 FINDINGS: Prior CABG. Heart is borderline in size. Bibasilar linear atelectasis  or scarring. No effusions or edema. No acute bony abnormality. IMPRESSION: Bibasilar atelectasis or scarring. Electronically Signed   By: Rolm Baptise M.D.   On: 07/03/2022 00:16   CT HEAD WO CONTRAST (5MM)  Result Date: 07/02/2022 CLINICAL DATA:  Weakness for 2 days with left facial droop EXAM: CT HEAD WITHOUT CONTRAST TECHNIQUE: Contiguous  axial images were obtained from the base of the skull through the vertex without intravenous contrast. RADIATION DOSE REDUCTION: This exam was performed according to the departmental dose-optimization program which includes automated exposure control, adjustment of the mA and/or kV according to patient size and/or use of iterative reconstruction technique. COMPARISON:  02/06/2022 FINDINGS: Brain: No evidence of acute infarction, hemorrhage, hydrocephalus, extra-axial collection or mass lesion/mass effect. Mild atrophic changes and chronic white matter ischemic changes are noted. Vascular: No hyperdense vessel or unexpected calcification. Skull: Normal. Negative for fracture or focal lesion. Sinuses/Orbits: No acute finding. Other: None. IMPRESSION: Chronic atrophic and ischemic changes.  No acute abnormality noted. Electronically Signed   By: Inez Catalina M.D.   On: 07/02/2022 23:45    Pending Labs Unresulted Labs (From admission, onward)     Start     Ordered   07/04/22 0500  Comprehensive metabolic panel  Tomorrow morning,   R        07/03/22 0430   07/04/22 0500  CBC with Differential/Platelet  Tomorrow morning,   R        07/03/22 0430   07/04/22 0500  Magnesium  Tomorrow morning,   R        07/03/22 0430   07/03/22 0131  Urine Culture (for pregnant, neutropenic or urologic patients or patients with an indwelling urinary catheter)  (Urine Labs)  Add-on,   AD       Question:  Indication  Answer:  Sepsis   07/03/22 0130   07/03/22 0124  Lactic acid, plasma  (Septic presentation on arrival (screening labs, nursing and treatment orders for obvious sepsis))  Now then every 2 hours,   R      07/03/22 0124            Vitals/Pain Today's Vitals   07/03/22 1031 07/03/22 1100 07/03/22 1203 07/03/22 1203  BP:  (!) 154/52    Pulse:  97    Resp:  20    Temp:    99.1 F (37.3 C)  TempSrc:    Oral  SpO2:  93%    Weight:      Height:      PainSc: 0-No pain  0-No pain     Isolation  Precautions No active isolations  Medications Medications  0.9 %  sodium chloride infusion ( Intravenous New Bag/Given 07/03/22 0436)  atorvastatin (LIPITOR) tablet 80 mg (80 mg Oral Given 07/03/22 1021)  levothyroxine (SYNTHROID) tablet 88 mcg (88 mcg Oral Given 07/03/22 0626)  apixaban (ELIQUIS) tablet 2.5 mg (2.5 mg Oral Given 07/03/22 1021)  triamcinolone (NASACORT) nasal inhaler 1 spray (1 spray Each Nare Not Given 07/03/22 1155)  acetaminophen (TYLENOL) tablet 650 mg (650 mg Oral Given 07/03/22 1027)    Or  acetaminophen (TYLENOL) suppository 650 mg ( Rectal See Alternative 07/03/22 1027)  ondansetron (ZOFRAN) tablet 4 mg (has no administration in time range)    Or  ondansetron (ZOFRAN) injection 4 mg (has no administration in time range)  cefTRIAXone (ROCEPHIN) 2 g in sodium chloride 0.9 % 100 mL IVPB (has no administration in time range)  lactated ringers bolus 1,000 mL (0 mLs Intravenous Stopped 07/03/22  0300)    And  lactated ringers bolus 1,000 mL (0 mLs Intravenous Stopped 07/03/22 0448)  cefTRIAXone (ROCEPHIN) 1 g in sodium chloride 0.9 % 100 mL IVPB (0 g Intravenous Stopped 07/03/22 0243)  labetalol (NORMODYNE) injection 10 mg (10 mg Intravenous Given 07/03/22 0432)  metoprolol tartrate (LOPRESSOR) injection 5 mg (5 mg Intravenous Given 07/03/22 0618)    Mobility walks with device     Focused Assessments Neuro Assessment Handoff:  Swallow screen pass? Yes  Cardiac Rhythm: Sinus tachycardia, Normal sinus rhythm NIH Stroke Scale  Dizziness Present: No Headache Present: No Interval: Other (Comment) Level of Consciousness (1a.)   : Alert, keenly responsive LOC Questions (1b. )   : Answers both questions correctly LOC Commands (1c. )   : Performs both tasks correctly Best Gaze (2. )  : Normal Visual (3. )  : No visual loss Facial Palsy (4. )    : Minor paralysis Motor Arm, Left (5a. )   : No drift Motor Arm, Right (5b. ) : No drift Motor Leg, Left (6a. )  : No drift Motor Leg, Right  (6b. ) : No drift Limb Ataxia (7. ): Absent Sensory (8. )  : Normal, no sensory loss Best Language (9. )  : No aphasia Dysarthria (10. ): Normal Extinction/Inattention (11.)   : No Abnormality Complete NIHSS TOTAL: 1 Last date known well:  (unknown)   Neuro Assessment: Exceptions to WDL Neuro Checks:   Other (Comment) (07/03/22 [id number])  Has TPA been given? No If patient is a Neuro Trauma and patient is going to OR before floor call report to [place] nurse: [phone] or [phone]  , Renal Assessment Handoff:   R Recommendations: See Admitting Provider Note  Report given to:   Additional Notes: intermittent elevated temp.  [place].

## 2022-07-03 NOTE — Assessment & Plan Note (Deleted)
Verified DNR/DNI status with the patient.  Husband agrees with this.

## 2022-07-03 NOTE — ED Notes (Signed)
ED TO INPATIENT HANDOFF REPORT  ED Nurse Name and Phone #: Elmo Putt [phone]  S Name/Age/Gender [name] 82 y.o. female Room/Bed: 025C/025C  Code Status   Code Status: DNR  Home/SNF/Other Independent living Patient oriented to: self, place, time, and situation Is this baseline? Yes   Triage Complete: Triage complete  Chief Complaint Acute cystitis without hematuria [N30.00]  Triage Note BIB EMS from [place]. Living. pt feels weak x2 days. Upon EMS arrival noticed left sided facial droop and left arm grip weakness. Unknown LKW. Usually ambulatory with walker, has been sliding out of chair.    Allergies Allergies  Allergen Reactions   Nsaids Other (See Comments)    Stomach upset Told to avoid due to Eliquis   Aspirin Other (See Comments)    Stomach upset Told to avoid due to Eliquis   Codeine Nausea And Vomiting   Nitrostat [Nitroglycerin] Other (See Comments)    Bradycardia. Drop in heart rate     Level of Care/Admitting Diagnosis ED Disposition     ED Disposition  Admit   Condition  --   [place]: [place] [[id number]]  Level of Care: Med-Surg [16]  May admit patient to [facility] or [facility] if equivalent level of care is available:: No  Covid Evaluation: Asymptomatic - no recent exposure (last 10 days) testing not required  Diagnosis: Acute cystitis without hematuria [id number]  Admitting Physician: Bridgett Larsson, [place]  Attending Physician: Bridgett Larsson, ERIC [id number]  Certification:: I certify this patient will need inpatient services for at least 2 midnights  Estimated Length of Stay: 4          B Medical/Surgery History Past Medical History:  Diagnosis Date   Abdominal aortic aneurysm ([place])    REPAIRED IN 1996 BY DR [place] BY [name]   Acquired asplenia     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 Most likely scar. Mild subendocardial ischemia. EF 71% LOW RISK.    Cervical disc disease    fracture   Chronic kidney disease    COPD (chronic obstructive pulmonary disease) (HCC)    Diverticulosis    DVT (deep venous thrombosis) ([place])    RLE; Sept '23   History of DVT (deep vein thrombosis) 03/06/2022   History of pulmonary embolus (PE)    Oct '23   Hyperlipidemia    Hypertension    Hypothyroidism (acquired)    hypo   Myocardial infarction ([place]) 11/2014   TIA   Polymyalgia rheumatica ([place])    2011 Dr. Charlestine Night   Pulmonary emboli [facility]) 02/06/2022   01/2022 PE/DVT post-op. Lower GI bleed     Treat edema  Start Eliquis at the reduced dose due to Creat>1.5 and age >46   Rheumatoid arthritis ([place]) 2011   Dr.Truslow; fracture knees, hands and wrists -    S/P CABG x 1 1996   CABG--LIMA-LAD for ostial LAD (  not felt to be PCI amenable). EF NORMAL then; LIMA now atretic   Shortness of breath dyspnea    with exertion   Stroke ([place]) 11/2014   TIA    Urinary frequency    Past Surgical History:  Procedure Laterality Date   ABDOMINAL AORTIC ANEURYSM REPAIR  [id number]   Complicated by mesenteric artery stenosis and splenic artery infarction with acquired Asplenia   APPENDECTOMY     BUNIONECTOMY  07/2011   right foot   CARDIAC CATHETERIZATION  2005   (Most recent CATH) - ostial LAD lesion 20-30% (down from 90% initially). Atretic LIMA. Minimal disease the RCA and Circumflex system.   CARPAL TUNNEL RELEASE Left    CATARACT EXTRACTION Bilateral    CERVICAL SPINE SURGERY     plate 2008  Dr. Saintclair Halsted   COLONOSCOPY     CORONARY ARTERY BYPASS GRAFT  1996   INCLUDED AN INTERNAL MAMMARY ARTERY TO THE LAD. EF WAS NORMAL   INGUINAL HERNIA REPAIR Right    LAPAROSCOPIC APPENDECTOMY N/A 06/28/2016   Procedure: APPENDECTOMY LAPAROSCOPIC;  Surgeon: Leighton Ruff, MD;  Location: [facility];  Service: General;  Laterality: N/A;   NM MYOVIEW LTD  06/2010   Fixed anteroseptal, apical and inferoapical defect with moderate size. Most likely scar. Mild subendocardial ischemia. EF 71% LOW RISK.    SPLENECTOMY     TOTAL HIP ARTHROPLASTY Left 01/23/2022   Procedure: LEFT TOTAL HIP ARTHROPLASTY ANTERIOR APPROACH;  Surgeon: Leandrew Koyanagi, MD;  Location: [place];  Service: Orthopedics;  Laterality: Left;  3-C   TRANSTHORACIC ECHOCARDIOGRAM  12/2014   [facility]: Normal LV size & function. EF 55-60%,    vagina polyp     VIDEO ASSISTED THORACOSCOPY (VATS)/WEDGE RESECTION Left 03/02/2015   Procedure: VIDEO ASSISTED THORACOSCOPY (VATS), MINI THORACOTOMY, LEFT UPPER LOBE WEDGE, TAKE DOWN OF INTERNAL MAMMARY LESIONS, PLACEMENT OF ON-Q PUMP;  Surgeon: Grace Isaac, MD;  Location: [place];  Service: Thoracic;  Laterality: Left;   VIDEO BRONCHOSCOPY N/A 03/02/2015   Procedure: BRONCHOSCOPY;  Surgeon: Grace Isaac, MD;  Location: [place];  Service: Thoracic;  Laterality: N/A;   VIDEO BRONCHOSCOPY WITH ENDOBRONCHIAL NAVIGATION N/A 10/08/2017   Procedure: VIDEO BRONCHOSCOPY WITH ENDOBRONCHIAL NAVIGATION WITH BIOPSIES OF LEFT UPPER LOBE AND LEFT LOWER LOBE;  Surgeon: Grace Isaac, MD;  Location: [place];  Service: Thoracic;  Laterality: N/A;     A IV Location/Drains/Wounds Patient Lines/Drains/Airways Status     Active Line/Drains/Airways     Name Placement date Placement time Site Days   Peripheral IV 07/03/22 22 G Posterior;Right Forearm 07/03/22  --  Forearm  less than 1   Peripheral IV 07/03/22 Anterior;Right Forearm 07/03/22  0246  Forearm  less than 1   Wound / Incision (Open or Dehisced)  02/17/22 Irritant Dermatitis (Moisture Associated Skin Damage) Perineum Bilateral 02/17/22  0130  Perineum  136            Intake/Output Last 24 hours  Intake/Output Summary (Last 24 hours) at 07/03/2022 0250 Last data filed at 07/03/2022 0243 Gross per 24 hour  Intake 100 ml  Output --  Net 100 ml    Labs/Imaging Results for orders placed or performed during the hospital encounter of 07/02/22 (from the past 48 hour(s))  Protime-INR     Status: None   Collection Time: 07/02/22 11:29 PM  Result Value Ref Range   Prothrombin Time 15.2 11.4 - 15.2 seconds   INR 1.2 0.8 - 1.2    Comment: (NOTE)  INR goal varies based on device and disease states. Performed at [facility], [place] [address], [place], [place] [address]   APTT     Status: None   Collection Time: 07/02/22 11:29 PM  Result Value Ref Range   aPTT 31 24 - 36 seconds    Comment: Performed at [facility] [address] [place], [place] [address]  CBC     Status: Abnormal   Collection Time: 07/02/22 11:29 PM  Result Value Ref Range   WBC 23.8 (H) 4.0 - 10.5 K/uL   RBC 3.70 (L) 3.87 - 5.11 MIL/uL   Hemoglobin 11.8 (L) 12.0 - 15.0 g/dL   HCT 33.0 (L) 36.0 - 46.0 %   MCV 89.2 80.0 - 100.0 fL   MCH 31.9 26.0 - 34.0 pg   MCHC 35.8 30.0 - 36.0 g/dL   RDW 18.6 (H) 11.5 - 15.5 %   Platelets 198 150 - 400 K/uL   nRBC 0.3 (H) 0.0 - 0.2 %    Comment: Performed at [facility] [address], [place], [place] [address]  Differential     Status: Abnormal   Collection Time: 07/02/22 11:29 PM  Result Value Ref Range   Neutrophils Relative % 83 %   Neutro Abs 20.1 (H) 1.7 - 7.7 K/uL   Lymphocytes Relative 4 %   Lymphs Abs 0.8 0.7 - 4.0 K/uL   Monocytes Relative 10 %   Monocytes Absolute 2.4 (H) 0.1 - 1.0 K/uL   Eosinophils Relative 0 %   Eosinophils Absolute 0.0 0.0 - 0.5 K/uL   Basophils Relative 1 %   Basophils Absolute 0.1 0.0 - 0.1 K/uL   Immature Granulocytes 2 %   Abs Immature Granulocytes 0.40  (H) 0.00 - 0.07 K/uL    Comment: Performed at [facility] [address] [place], [place] [address]  Urine rapid drug screen (hosp performed)     Status: None   Collection Time: 07/02/22 11:29 PM  Result Value Ref Range   Opiates NONE DETECTED NONE DETECTED   Cocaine NONE DETECTED NONE DETECTED   Benzodiazepines NONE DETECTED NONE DETECTED   Amphetamines NONE DETECTED NONE DETECTED   Tetrahydrocannabinol NONE DETECTED NONE DETECTED   Barbiturates NONE DETECTED NONE DETECTED    Comment: (NOTE) DRUG SCREEN FOR MEDICAL PURPOSES ONLY.  IF CONFIRMATION IS NEEDED FOR ANY PURPOSE, NOTIFY LAB WITHIN 5 DAYS.  LOWEST DETECTABLE LIMITS FOR URINE DRUG SCREEN Drug Class                     Cutoff (ng/mL) Amphetamine and metabolites    1000 Barbiturate and metabolites    200 Benzodiazepine                 200 Opiates and metabolites        300 Cocaine and metabolites        300 THC                            50 Performed at [facility], [place] [address] [place] [address] [place],  [address]   Urinalysis, Routine w reflex microscopic -Urine, Catheterized     Status: Abnormal   Collection Time: 07/02/22 11:29 PM  Result Value Ref Range   Color, Urine AMBER (A) YELLOW    Comment: BIOCHEMICALS MAY BE AFFECTED BY COLOR   APPearance TURBID (A) CLEAR   Specific Gravity, Urine 1.015 1.005 -  1.030   pH 5.0 5.0 - 8.0   Glucose, UA NEGATIVE NEGATIVE mg/dL   Hgb urine dipstick SMALL (A) NEGATIVE   Bilirubin Urine NEGATIVE NEGATIVE   Ketones, ur NEGATIVE NEGATIVE mg/dL   Protein, ur >=300 (A) NEGATIVE mg/dL   Nitrite NEGATIVE NEGATIVE   Leukocytes,Ua LARGE (A) NEGATIVE   RBC / HPF >50 0 - 5 RBC/hpf   WBC, UA >50 0 - 5 WBC/hpf   Bacteria, UA MANY (A) NONE SEEN   Squamous Epithelial / HPF 0-5 0 - 5 /HPF   WBC Clumps PRESENT    Mucus PRESENT     Comment: Performed at [facility], [place] [address] [place], [place] [address]  I-stat chem 8, ED     Status: Abnormal   Collection Time:  07/02/22 11:37 PM  Result Value Ref Range   Sodium 124 (L) 135 - 145 mmol/L   Potassium 5.9 (H) 3.5 - 5.1 mmol/L   Chloride 91 (L) 98 - 111 mmol/L   BUN 118 (H) 8 - 23 mg/dL   Creatinine, Ser 4.70 (H) 0.44 - 1.00 mg/dL   Glucose, Bld 168 (H) 70 - 99 mg/dL    Comment: Glucose reference range applies only to samples taken after fasting for at least 8 hours.   Calcium, Ion 0.85 (LL) 1.15 - 1.40 mmol/L   TCO2 25 22 - 32 mmol/L   Hemoglobin 12.9 12.0 - 15.0 g/dL   HCT 38.0 36.0 - 46.0 %   Comment NOTIFIED PHYSICIAN   Ethanol     Status: None   Collection Time: 07/03/22 12:33 AM  Result Value Ref Range   Alcohol, Ethyl (B) <10 <10 mg/dL    Comment: (NOTE) Lowest detectable limit for serum alcohol is 10 mg/dL.  For medical purposes only. Performed at [facility], [place] [address] [place], [place] [address]   Comprehensive metabolic panel     Status: Abnormal   Collection Time: 07/03/22 12:33 AM  Result Value Ref Range   Sodium 128 (L) 135 - 145 mmol/L   Potassium 4.3 3.5 - 5.1 mmol/L    Comment: HEMOLYSIS AT THIS LEVEL MAY AFFECT RESULT   Chloride 92 (L) 98 - 111 mmol/L   CO2 19 (L) 22 - 32 mmol/L   Glucose, Bld 166 (H) 70 - 99 mg/dL    Comment: Glucose reference range applies only to samples taken after fasting for at least 8 hours.   BUN 94 (H) 8 - 23 mg/dL   Creatinine, Ser 4.09 (H) 0.44 - 1.00 mg/dL   Calcium 8.6 (L) 8.9 - 10.3 mg/dL   Total Protein 5.4 (L) 6.5 - 8.1 g/dL   Albumin 2.5 (L) 3.5 - 5.0 g/dL   AST 38 15 - 41 U/L    Comment: HEMOLYSIS AT THIS LEVEL MAY AFFECT RESULT   ALT 20 0 - 44 U/L    Comment: HEMOLYSIS AT THIS LEVEL MAY AFFECT RESULT   Alkaline Phosphatase 95 38 - 126 U/L   Total Bilirubin 1.0 0.3 - 1.2 mg/dL    Comment: HEMOLYSIS AT THIS LEVEL MAY AFFECT RESULT   GFR, Estimated 10 (L) >60 mL/min    Comment: (NOTE) Calculated using the CKD-EPI Creatinine Equation (2021)    Anion gap 17 (H) 5 - 15    Comment: Performed at [facility],  [place] [address] [place], [place] [address]   DG Chest Portable 1 View  Result Date: 07/03/2022 CLINICAL DATA:  Altered mental status EXAM: PORTABLE CHEST  1 VIEW COMPARISON:  03/06/2022 FINDINGS: Prior CABG. Heart is borderline in size. Bibasilar linear atelectasis or scarring. No effusions or edema. No acute bony abnormality. IMPRESSION: Bibasilar atelectasis or scarring. Electronically Signed   By: Rolm Baptise M.D.   On: 07/03/2022 00:16   CT HEAD WO CONTRAST (5MM)  Result Date: 07/02/2022 CLINICAL DATA:  Weakness for 2 days with left facial droop EXAM: CT HEAD WITHOUT CONTRAST TECHNIQUE: Contiguous axial images were obtained from the base of the skull through the vertex without intravenous contrast. RADIATION DOSE REDUCTION: This exam was performed according to the departmental dose-optimization program which includes automated exposure control, adjustment of the mA and/or kV according to patient size and/or use of iterative reconstruction technique. COMPARISON:  02/06/2022 FINDINGS: Brain: No evidence of acute infarction, hemorrhage, hydrocephalus, extra-axial collection or mass lesion/mass effect. Mild atrophic changes and chronic white matter ischemic changes are noted. Vascular: No hyperdense vessel or unexpected calcification. Skull: Normal. Negative for fracture or focal lesion. Sinuses/Orbits: No acute finding. Other: None. IMPRESSION: Chronic atrophic and ischemic changes.  No acute abnormality noted. Electronically Signed   By: Inez Catalina M.D.   On: 07/02/2022 23:45    Pending Labs Unresulted Labs (From admission, onward)     Start     Ordered   07/03/22 0131  Urine Culture (for pregnant, neutropenic or urologic patients or patients with an indwelling urinary catheter)  (Urine Labs)  Add-on,   AD       Question:  Indication  Answer:  Sepsis   07/03/22 0130   07/03/22 0124  Resp panel by RT-PCR (RSV, Flu A&B, Covid) Peripheral  (Septic presentation on arrival (screening labs, nursing and  treatment orders for obvious sepsis))  Once,   URGENT        07/03/22 0124   07/03/22 0124  Lactic acid, plasma  (Septic presentation on arrival (screening labs, nursing and treatment orders for obvious sepsis))  Now then every 2 hours,   R      07/03/22 0124   07/03/22 0124  Blood Culture (routine x 2)  (Septic presentation on arrival (screening labs, nursing and treatment orders for obvious sepsis))  BLOOD CULTURE X 2,   STAT      07/03/22 0124   Signed and Held  Comprehensive metabolic panel  Tomorrow morning,   R        Signed and Held   Signed and Held  CBC with Differential/Platelet  Tomorrow morning,   R        Signed and Held   Signed and Held  Magnesium  Tomorrow morning,   R        Signed and Held            Vitals/Pain Today's Vitals   07/02/22 2308 07/02/22 2309 07/02/22 2314  BP:   (!) 156/57  Pulse:   (!) 103  Resp:   20  Temp:   99.8 F (37.7 C)  TempSrc:   Oral  SpO2:   98%  Weight:  63.5 kg   Height:  '5\' 3"'$  (1.6 m)   PainSc: 0-No pain      Isolation Precautions No active isolations  Medications Medications  0.9 %  sodium chloride infusion (has no administration in time range)  lactated ringers bolus 1,000 mL (1,000 mLs Intravenous New Bag/Given 07/03/22 0230)    And  lactated ringers bolus 1,000 mL (has no administration in time range)  cefTRIAXone (ROCEPHIN) 1 g in sodium chloride 0.9 % 100 mL IVPB (  has no administration in time range)  cefTRIAXone (ROCEPHIN) 1 g in sodium chloride 0.9 % 100 mL IVPB (0 g Intravenous Stopped 07/03/22 0243)    Mobility walks with device     Focused Assessments Cardiac Assessment Handoff:  Cardiac Rhythm: Sinus tachycardia Lab Results  Component Value Date   CKTOTAL 40 12/26/2019   Lab Results  Component Value Date   DDIMER 0.61 (H) 01/22/2019   Does the Patient currently have chest pain? No   , Neuro Assessment Handoff:  Swallow screen pass? Yes  Cardiac Rhythm: Sinus tachycardia NIH Stroke Scale   Dizziness Present: No Headache Present: No Level of Consciousness (1a.)   : Alert, keenly responsive LOC Questions (1b. )   : Answers both questions correctly LOC Commands (1c. )   : Performs both tasks correctly Best Gaze (2. )  : Normal Visual (3. )  : No visual loss Facial Palsy (4. )    : Minor paralysis (left sided facial droop) Motor Arm, Left (5a. )   : No drift Motor Arm, Right (5b. ) : No drift Motor Leg, Left (6a. )  : No drift Motor Leg, Right (6b. ) : No drift Sensory (8. )  : Normal, no sensory loss Best Language (9. )  : No aphasia Dysarthria (10. ): Normal Extinction/Inattention (11.)   : No Abnormality     Neuro Assessment: Exceptions to WDL Neuro Checks:      Has TPA been given? No If patient is a Neuro Trauma and patient is going to OR before floor call report to [place] nurse: [phone] or [phone]  , Pulmonary Assessment Handoff:  Lung sounds:   O2 Device: Room Air      R Recommendations: See Admitting Provider Note  Report given to:   Additional Notes: 50 mL bladder scan.

## 2022-07-03 NOTE — ED Notes (Signed)
This RN with Clarene Critchley RN attempted foley catheter. This RN unsuccessful at placing catheter.

## 2022-07-03 NOTE — Assessment & Plan Note (Addendum)
Sodium of 124 on admission. Likely related to dehydration and hypovolemia, present on admission. Patient started on IV fluids with initial improvement in sodium to 134.

## 2022-07-04 ENCOUNTER — Ambulatory Visit: Payer: Medicare PPO | Attending: Cardiology | Admitting: Cardiology

## 2022-07-04 DIAGNOSIS — N10 Acute tubulo-interstitial nephritis: Secondary | ICD-10-CM | POA: Diagnosis not present

## 2022-07-04 DIAGNOSIS — N184 Chronic kidney disease, stage 4 (severe): Secondary | ICD-10-CM | POA: Diagnosis not present

## 2022-07-04 DIAGNOSIS — E039 Hypothyroidism, unspecified: Secondary | ICD-10-CM

## 2022-07-04 DIAGNOSIS — B9620 Unspecified Escherichia coli [E. coli] as the cause of diseases classified elsewhere: Secondary | ICD-10-CM

## 2022-07-04 DIAGNOSIS — A4151 Sepsis due to Escherichia coli [E. coli]: Secondary | ICD-10-CM | POA: Diagnosis not present

## 2022-07-04 DIAGNOSIS — R531 Weakness: Secondary | ICD-10-CM

## 2022-07-04 DIAGNOSIS — R7881 Bacteremia: Secondary | ICD-10-CM | POA: Insufficient documentation

## 2022-07-04 DIAGNOSIS — E8729 Other acidosis: Secondary | ICD-10-CM

## 2022-07-04 LAB — COMPREHENSIVE METABOLIC PANEL
ALT: 17 U/L (ref 0–44)
AST: 25 U/L (ref 15–41)
Albumin: 2.2 g/dL — ABNORMAL LOW (ref 3.5–5.0)
Alkaline Phosphatase: 93 U/L (ref 38–126)
Anion gap: 16 — ABNORMAL HIGH (ref 5–15)
BUN: 82 mg/dL — ABNORMAL HIGH (ref 8–23)
CO2: 18 mmol/L — ABNORMAL LOW (ref 22–32)
Calcium: 8 mg/dL — ABNORMAL LOW (ref 8.9–10.3)
Chloride: 100 mmol/L (ref 98–111)
Creatinine, Ser: 3.69 mg/dL — ABNORMAL HIGH (ref 0.44–1.00)
GFR, Estimated: 12 mL/min — ABNORMAL LOW (ref >60)
Glucose, Bld: 78 mg/dL (ref 70–99)
Potassium: 3.5 mmol/L (ref 3.5–5.1)
Sodium: 134 mmol/L — ABNORMAL LOW (ref 135–145)
Total Bilirubin: 0.7 mg/dL (ref 0.3–1.2)
Total Protein: 5.4 g/dL — ABNORMAL LOW (ref 6.5–8.1)

## 2022-07-04 LAB — CBC WITH DIFFERENTIAL/PLATELET
Abs Immature Granulocytes: 0.14 10*3/uL — ABNORMAL HIGH (ref 0.00–0.07)
Basophils Absolute: 0.1 10*3/uL (ref 0.0–0.1)
Basophils Relative: 1 %
Eosinophils Absolute: 0.3 10*3/uL (ref 0.0–0.5)
Eosinophils Relative: 1 %
HCT: 31.4 % — ABNORMAL LOW (ref 36.0–46.0)
Hemoglobin: 11 g/dL — ABNORMAL LOW (ref 12.0–15.0)
Immature Granulocytes: 1 %
Lymphocytes Relative: 4 %
Lymphs Abs: 0.8 10*3/uL (ref 0.7–4.0)
MCH: 31.2 pg (ref 26.0–34.0)
MCHC: 35 g/dL (ref 30.0–36.0)
MCV: 89 fL (ref 80.0–100.0)
Monocytes Absolute: 2.6 10*3/uL — ABNORMAL HIGH (ref 0.1–1.0)
Monocytes Relative: 13 %
Neutro Abs: 16.2 10*3/uL — ABNORMAL HIGH (ref 1.7–7.7)
Neutrophils Relative %: 80 %
Platelets: 204 10*3/uL (ref 150–400)
RBC: 3.53 MIL/uL — ABNORMAL LOW (ref 3.87–5.11)
RDW: 18.2 % — ABNORMAL HIGH (ref 11.5–15.5)
WBC: 20.1 10*3/uL — ABNORMAL HIGH (ref 4.0–10.5)
nRBC: 0.2 % (ref 0.0–0.2)

## 2022-07-04 LAB — MAGNESIUM: Magnesium: 1.8 mg/dL (ref 1.7–2.4)

## 2022-07-04 MED ORDER — HYDRALAZINE HCL 25 MG PO TABS
25.0000 mg | ORAL_TABLET | Freq: Four times a day (QID) | ORAL | Status: DC | PRN
  Administered 2022-07-06 – 2022-07-07 (×3): 25 mg via ORAL
  Filled 2022-07-04 (×3): qty 1

## 2022-07-04 MED ORDER — CARVEDILOL 3.125 MG PO TABS
3.1250 mg | ORAL_TABLET | Freq: Two times a day (BID) | ORAL | Status: DC
  Administered 2022-07-04 – 2022-07-05 (×3): 3.125 mg via ORAL
  Filled 2022-07-04 (×3): qty 1

## 2022-07-04 NOTE — Plan of Care (Signed)

## 2022-07-04 NOTE — Assessment & Plan Note (Signed)
Secondary to AKI. Expect this to improve. -If acidosis worsens, start sodium bicarbonate tablets

## 2022-07-04 NOTE — Progress Notes (Signed)
Physical Therapy Treatment Patient Details Name: [name] MRN: [id number] DOB: 04/29/1941 Today's Date: 07/04/2022   History of Present Illness 82 y.o. female [facility] to [facility] hospital on 07/02/2022 with worsening weakness. Pt admitted for management of acute cystitis. PMH includes CKD IV, HTN, RA, hypothyroidism, CABG, COPD, HLD, PE.    PT Comments    Pt tolerated today's session well, progressing with mobility today. Pt continuing to require minG for all mobility, with minA to stand from toilet, ambulating with RW and requiring verbal cues for navigation of RW around obstacles and in narrow areas. Pt cued for forward gaze with ambulation to prepare to negotiate around obstacles, mild improvement noted. Pt without overt LOB but mild unsteadiness noted, reliant on RW for mobility. Pt will continue to benefit from skilled acute PT to progress mobility, pt continues to report feeling below her baseline, will continue to recommend SNF at this time with possible progression to HHPT at her ILF. Acute PT will follow as appropriate.     Recommendations for follow up therapy are one component of a multi-disciplinary discharge planning process, led by the attending physician.  Recommendations may be updated based on patient status, additional functional criteria and insurance authorization.  Follow Up Recommendations  Skilled nursing-short term rehab (<3 hours/day) (S[facility] at [facility]) Can patient physically be transported by private vehicle: Yes   Assistance Recommended at Discharge Intermittent Supervision/Assistance  Patient can return home with the following Assistance with cooking/housework;Assist for transportation;Help with stairs or ramp for entrance;Direct supervision/assist for medications management;Direct supervision/assist for financial management;A little help with walking and/or transfers;A little help with bathing/dressing/bathroom   Equipment Recommendations  None recommended by  PT    Recommendations for Other Services       Precautions / Restrictions Precautions Precautions: Fall Restrictions Weight Bearing Restrictions: No     Mobility  Bed Mobility Overal bed mobility: Needs Assistance Bed Mobility: Supine to Sit     Supine to sit: Min guard, HOB elevated     General bed mobility comments: increased time with use of bed rail    Transfers Overall transfer level: Needs assistance Equipment used: Rolling walker (2 wheels) Transfers: Sit to/from Stand Sit to Stand: Min guard, Min assist           General transfer comment: minA and grab bar to stand from toilet, minG from toilet    Ambulation/Gait Ambulation/Gait assistance: Min guard Gait Distance (Feet): 100 Feet (x20' first trial to the bathroom) Assistive device: Rolling walker (2 wheels) Gait Pattern/deviations: Step-through pattern, Decreased stride length, Decreased dorsiflexion - right, Decreased dorsiflexion - left Gait velocity: decreased     General Gait Details: step-to pattern but progressing to step-through, reverting back to step-through in narrow areas when focused on RW management. When turning, pt picking up RW to advance or when stepping backwards to chair, cueing required for RW management, intermittently running into stationary obstacles   Stairs             Wheelchair Mobility    Modified Rankin (Stroke Patients Only)       Balance Overall balance assessment: Needs assistance Sitting-balance support: Feet supported, No upper extremity supported Sitting balance-Leahy Scale: Fair Sitting balance - Comments: stable sitting balance   Standing balance support: Bilateral upper extremity supported, During functional activity, Reliant on assistive device for balance Standing balance-Leahy Scale: Poor Standing balance comment: reliant on RW for balance, no LOB throughout ambulation  Cognition Arousal/Alertness:  Awake/alert Behavior During Therapy: WFL for tasks assessed/performed Overall Cognitive Status: Impaired/Different from baseline Area of Impairment: Problem solving                             Problem Solving: Slow processing General Comments: pt pleasant throughout session, intermittent cueing for RW management, especially in narrow spaces        Exercises      General Comments General comments (skin integrity, edema, etc.): SPO2 stable on room air, HR elevating to 120s with ambulation but recovers with seated rest break      Pertinent Vitals/Pain Pain Assessment Pain Assessment: No/denies pain    Home Living Family/patient expects to be discharged to:: Other (Comment)                   Additional Comments: ILF at [facility]    Prior Function            PT Goals (current goals can now be found in the care plan section) Acute Rehab PT Goals Patient Stated Goal: to improve strength, return to independence PT Goal Formulation: With patient Time For Goal Achievement: 07/17/22 Potential to Achieve Goals: Good Progress towards PT goals: Progressing toward goals    Frequency    Min 3X/week      PT Plan Current plan remains appropriate    Co-evaluation              AM-PAC PT "6 Clicks" Mobility   Outcome Measure  Help needed turning from your back to your side while in a flat bed without using bedrails?: A Little Help needed moving from lying on your back to sitting on the side of a flat bed without using bedrails?: A Little Help needed moving to and from a bed to a chair (including a wheelchair)?: A Little Help needed standing up from a chair using your arms (e.g., wheelchair or bedside chair)?: A Little Help needed to walk in hospital room?: A Little Help needed climbing 3-5 steps with a railing? : Total 6 Click Score: 16    End of Session Equipment Utilized During Treatment: Gait belt Activity Tolerance: Patient tolerated  treatment well Patient left: in chair;with call bell/phone within reach;with chair alarm set Nurse Communication: Mobility status PT Visit Diagnosis: Other abnormalities of gait and mobility (R26.89);Muscle weakness (generalized) (M62.81);Repeated falls (R29.6)     Time: [id number] PT Time Calculation (min) (ACUTE ONLY): 23 min  Charges:  $Gait Training: 8-22 mins $Therapeutic Activity: 8-22 mins                     Charlynne Cousins, PT DPT Acute Rehabilitation Services Office [phone]    Luvenia Heller 07/04/2022, 2:12 PM

## 2022-07-04 NOTE — Evaluation (Signed)
Occupational Therapy Evaluation Patient Details Name: [name] MRN: [id number] DOB: Oct 07, 1940 Today's Date: 07/04/2022   History of Present Illness 82 y.o. female presents to [facility] hospital on 07/02/2022 with worsening weakness. Pt admitted for management of acute cystitis. PMH includes CKD IV, HTN, RA, hypothyroidism, CABG, COPD, HLD, PE.   Clinical Impression   PTA, pt lived at [place] [facility] [place] with her husband. Upon eval, pt performing UB ADL with set-up and LB Adlw ith min guard A. Ambulatory with RW to restroom and sink for grooming with min guard A during session as well as around the bed to the chair. No physical assist needed. Pt with good safety awareness, making the choice to use RW and OT encouraging pt to use walker on return home to optimize safety. Pt agreeable. Recommending HHOT to optimize safety and independence in ADL and IADL.      Recommendations for follow up therapy are one component of a multi-disciplinary discharge planning process, led by the attending physician.  Recommendations may be updated based on patient status, additional functional criteria and insurance authorization.   Follow Up Recommendations  Home health OT     Assistance Recommended at Discharge Intermittent Supervision/Assistance  Patient can return home with the following A little help with walking and/or transfers;A little help with bathing/dressing/bathroom;Assistance with cooking/housework;Assist for transportation;Help with stairs or ramp for entrance    Functional Status Assessment  Patient has had a recent decline in their functional status and demonstrates the ability to make significant improvements in function in a reasonable and predictable amount of time.  Equipment Recommendations  None recommended by OT    Recommendations for Other Services       Precautions / Restrictions Precautions Precautions: Fall Restrictions Weight Bearing Restrictions: No      Mobility Bed  Mobility Overal bed mobility: Needs Assistance Bed Mobility: Rolling, Sidelying to Sit Rolling: Min guard Sidelying to sit: Min guard       General bed mobility comments: significantly incresed time    Transfers Overall transfer level: Needs assistance Equipment used: Rolling walker (2 wheels) Transfers: Sit to/from Stand Sit to Stand: Min guard           General transfer comment: for safety. Initially min cues for hand placement with rise, but no cues on second attempt      Balance Overall balance assessment: Needs assistance Sitting-balance support: Feet supported, No upper extremity supported Sitting balance-Leahy Scale: Fair     Standing balance support: Single extremity supported Standing balance-Leahy Scale: Poor Standing balance comment: Reliant on RW                           ADL either performed or assessed with clinical judgement   ADL Overall ADL's : Needs assistance/impaired Eating/Feeding: Sitting;Bed level;Independent   Grooming: Supervision/safety;Standing;Oral care Grooming Details (indicate cue type and reason): at sink Upper Body Bathing: Set up;Sitting   Lower Body Bathing: Min guard;Sit to/from stand   Upper Body Dressing : Set up;Sitting   Lower Body Dressing: Min guard;Sit to/from stand   Toilet Transfer: Min guard;Ambulation;Rolling walker (2 wheels);Regular Toilet;Grab bars Toilet Transfer Details (indicate cue type and reason): in restroom in room Toileting- Water quality scientist and Hygiene: Min guard;Sit to/from stand Toileting - Clothing Manipulation Details (indicate cue type and reason): leaning forward and reaching between legs to wipe. Decr balance/sway observed. Educated to support on rollator or grab bar in home setting while wiping with other hand  Functional mobility during ADLs: Min guard;Rolling walker (2 wheels)       Vision Baseline Vision/History: 1 Wears glasses (intermittently) Ability to See in  Adequate Light: 0 Adequate Patient Visual Report: Diplopia (Pt reports she has intermittently been experiencing diplopia for quite some time and that her eye doctor is aware. Has some grid lines in her glasses, but does not "fix" diplopia when it is happening likely due to very mild grid lines.) Vision Assessment?: Vision impaired- to be further tested in functional context;Yes Eye Alignment: Within Functional Limits Ocular Range of Motion: Within Functional Limits Alignment/Gaze Preference: Within Defined Limits Tracking/Visual Pursuits: Decreased smoothness of horizontal tracking (min jumping movement during horizontal tracking.) Visual Fields: Impaired-to be further tested in functional context (Min differences in L eye and R eye peripheral/temporal visual field.) Diplopia Assessment: Other (comment) (Pt reports that when she is experiencing diplopia, she receives relief with one eye closed) Depth Perception:  (none observed during session)     Perception     Praxis      Pertinent Vitals/Pain Pain Assessment Pain Assessment: No/denies pain     Hand Dominance     Extremity/Trunk Assessment Upper Extremity Assessment Upper Extremity Assessment: Generalized weakness   Lower Extremity Assessment Lower Extremity Assessment: Defer to PT evaluation   Cervical / Trunk Assessment Cervical / Trunk Assessment: Kyphotic   Communication Communication Communication: No difficulties   Cognition Arousal/Alertness: Awake/alert Behavior During Therapy: WFL for tasks assessed/performed Overall Cognitive Status: Impaired/Different from baseline Area of Impairment: Problem solving                             Problem Solving: Slow processing General Comments: Pt oriented and following all commands throughout session. min cues for safe use of RW as pt typically using rollator at home and decreased knowledge of use of RW. Pt with good insight into current functional abilities and  activity tolerance     General Comments  VSS on RA    Exercises     Shoulder Instructions      Home Living Family/patient expects to be discharged to:: Other (Comment)                                 Additional Comments: ILF at [facility]      Prior Functioning/Environment Prior Level of Function : Independent/Modified Independent             Mobility Comments: ambulatory with SPC vs rollator ADLs Comments: independent with ADLs        OT Problem List: Decreased strength;Decreased activity tolerance;Impaired balance (sitting and/or standing);Decreased safety awareness;Decreased knowledge of use of DME or AE      OT Treatment/Interventions: Self-care/ADL training;Therapeutic exercise;DME and/or AE instruction;Therapeutic activities;Patient/family education;Balance training    OT Goals(Current goals can be found in the care plan section) Acute Rehab OT Goals Patient Stated Goal: get stronger and go home OT Goal Formulation: With patient Time For Goal Achievement: 07/18/22 Potential to Achieve Goals: Good  OT Frequency: Min 2X/week    Co-evaluation              AM-PAC OT "6 Clicks" Daily Activity     Outcome Measure Help from another person eating meals?: None Help from another person taking care of personal grooming?: A Little Help from another person toileting, which includes using toliet, bedpan, or urinal?: A Little Help from another  person bathing (including washing, rinsing, drying)?: A Little Help from another person to put on and taking off regular upper body clothing?: A Little Help from another person to put on and taking off regular lower body clothing?: A Little 6 Click Score: 19   End of Session Equipment Utilized During Treatment: Gait belt;Rolling walker (2 wheels) Nurse Communication: Mobility status;Other (comment) (up in chair, chair alarm)  Activity Tolerance: Patient tolerated treatment well Patient left: in  chair;with call bell/phone within reach;with chair alarm set;with family/visitor present  OT Visit Diagnosis: Unsteadiness on feet (R26.81);Muscle weakness (generalized) (M62.81);Other abnormalities of gait and mobility (R26.89)                Time: [id number] OT Time Calculation (min): 48 min Charges:  OT General Charges $OT Visit: 1 Visit OT Evaluation $OT Eval Low Complexity: 1 Low OT Treatments $Self Care/Home Management : 23-37 mins  Elder Cyphers, OTR/L [facility] Acute Rehabilitation Office: [phone]   Magnus Ivan 07/04/2022, 11:02 AM

## 2022-07-04 NOTE — Progress Notes (Addendum)
PROGRESS NOTE    [name]  [id number] DOB: [birth year] DOA: 07/02/2022 PCP: Cassandria Anger, MD   Brief Narrative: [name] is a 82 y.o. female with a history of CKD stage IV, hypertension, rheumatoid arthritis, hypothyroidism, CAD s/p CABG, COPD, hyperlipidemia, DVT/PE. Patient presented secondary to weakness and fever and is found to have evidence of sepsis with a likely urinary source. Empiric antibiotics started and cultures obtained. Patient also found to have hyponatremia with associated dehydration and AKI. IV fluids started. Blood cultures significant for E. Coli bacteremia. Renal function improving with IV fluids.   Assessment and Plan: * Acute pyelonephritis Patient with non-specific symptoms but with associated fever and leukocytosis. No flank pain, but clinically consistent with pyelonephritis. Urinalysis suggests infection, confirmed on urine culture. Patient started empirically on Ceftriaxone IV. Associated E. Coli bacteremia -Continue Ceftriaxone 2 g IV -Follow-up blood and urine culture data  Sepsis with acute renal failure ([place]) Present on admission. Associated fever and leukocytosis with WBC of 23,800 on admission. Urinary source with associated E. Coli bacteremia.  Hyponatremia Sodium of 124 on admission. Likely related to dehydration and hypovolemia, present on admission. Patient started on IV fluids with initial improvement in sodium to 134.  Acute metabolic encephalopathy Appears to be improved today with IV fluids. Likely related to overall illness but more specifically dehydration and hyponatremia. Patient is from independent living.  Acute renal failure superimposed on stage 4 chronic kidney disease (HCC) Creatinine of 4.70 on admission secondary to poor oral intake in addition to patient continuing diuretic use. Creatinine improved to 3.69 with IV fluids -Continue IV fluids -Transition off renal diet since having continued improvement of  renal function -Daily BMP  History of pulmonary embolism -Continue Eliquis  CKD (chronic kidney disease) stage 4, GFR 15-29 ml/min (HCC) - baseline SCr 2.5. follows with Dr. Royce Macadamia with [facility] Baseline serum creatinine of about 2.5.  COPD (chronic obstructive pulmonary disease) (HCC) Stable. Asymptomatic.  Hyperlipidemia -Continue Lipitor daily  Hx of CABG Noted. No chest pain.  Long term (current) use of anticoagulants Noted. Patient is on Eliquis as an outpatient for the indication of PE/DVT which does not qualify for dose reduction, however patient is on reduced dose of 2.5 mg BID as an outpatient; possibly related to history of GI bleeding. -Resume home dose of Eliquis 2.5 mg BID  Rheumatoid arthritis ([place]) Patient is on [place] as an outpatient which is held secondary to active infection.  Essential hypertension Patient is managed on Coreg as an outpatient. Uncontrolled while admitted. -Resume home Coreg -Add hydralazine PO PRN; may need to add second antihypertensive  Acquired hypothyroidism -Continue Synthroid 88 mcg daily  E coli bacteremia See problem, Acute pyelonephritis.  Generalized weakness Secondary to acute illness. PT/OT recommending SNF. Hoping patient will improve in functional capacity prior to discharge. Recommend discontinuation of Purewick during the day. Patient encouraged to work with nursing for transfer to commode for toileting. Also recommended to have patient up out of bed or sitting up during the day. Patient motivated to not discharge to rehab.  Metabolic acidosis, increased anion gap Secondary to AKI. Expect this to improve. -If acidosis worsens, start sodium bicarbonate tablets    DVT prophylaxis: Eliquis Code Status:   Code Status: DNR Family Communication: Son at bedside Disposition Plan: Discharge back to ILF vs higher level of care pending PT/OT, continued infectious workup/management with transition to oral antibiotics and  continued improvement of hyponatremia.   Consultants:  None  Procedures:  None  Antimicrobials: Ceftriaxone    Subjective: Patient reports no issues this morning. Feeling slightly better  Objective: BP (!) 191/82 (BP Location: Right Arm)   Pulse 100   Temp 98.2 F (36.8 C) (Oral)   Resp 19   Ht '5\' 3"'$  (1.6 m)   Wt 63.5 kg   SpO2 95%   BMI 24.80 kg/m   Examination:  General exam: Appears calm and comfortable Respiratory system: Clear to auscultation. Respiratory effort normal. Cardiovascular system: S1 & S2 heard, RRR. 2/6 systolic murmur Gastrointestinal system: Abdomen is nondistended, soft and nontender. No organomegaly or masses felt. Normal bowel sounds heard. Central nervous system: Alert and oriented. No focal neurological deficits. Musculoskeletal: No calf tenderness Skin: No cyanosis. No rashes Psychiatry: Judgement and insight appear normal. Mood & affect appropriate.    Data Reviewed: I have personally reviewed following labs and imaging studies  CBC Lab Results  Component Value Date   WBC 20.1 (H) 07/04/2022   RBC 3.53 (L) 07/04/2022   HGB 11.0 (L) 07/04/2022   HCT 31.4 (L) 07/04/2022   MCV 89.0 07/04/2022   MCH 31.2 07/04/2022   PLT 204 07/04/2022   MCHC 35.0 07/04/2022   RDW 18.2 (H) 07/04/2022   LYMPHSABS 0.8 07/04/2022   MONOABS 2.6 (H) 07/04/2022   EOSABS 0.3 07/04/2022   BASOSABS 0.1 [id number]     Last metabolic panel Lab Results  Component Value Date   NA 134 (L) 07/04/2022   K 3.5 07/04/2022   CL 100 07/04/2022   CO2 18 (L) 07/04/2022   BUN 82 (H) 07/04/2022   CREATININE 3.69 (H) 07/04/2022   GLUCOSE 78 07/04/2022   GFRNONAA 12 (L) 07/04/2022   GFRAA 24 (L) 12/26/2019   CALCIUM 8.0 (L) 07/04/2022   PROT 5.4 (L) 07/04/2022   ALBUMIN 2.2 (L) 07/04/2022   BILITOT 0.7 07/04/2022   ALKPHOS 93 07/04/2022   AST 25 07/04/2022   ALT 17 07/04/2022   ANIONGAP 16 (H) 07/04/2022    GFR: Estimated Creatinine Clearance: 10.2  mL/min (A) (by C-G formula based on SCr of 3.69 mg/dL (H)).  Recent Results (from the past 240 hour(s))  Resp panel by RT-PCR (RSV, Flu A&B, Covid) Peripheral     Status: None   Collection Time: 07/02/22 11:00 PM   Specimen: Peripheral; Nasal Swab  Result Value Ref Range Status   SARS Coronavirus 2 by RT PCR NEGATIVE NEGATIVE Final   Influenza A by PCR NEGATIVE NEGATIVE Final   Influenza B by PCR NEGATIVE NEGATIVE Final    Comment: (NOTE) The Xpert Xpress SARS-CoV-2/FLU/RSV plus assay is intended as an aid in the diagnosis of influenza from Nasopharyngeal swab specimens and should not be used as a sole basis for treatment. Nasal washings and aspirates are unacceptable for Xpert Xpress SARS-CoV-2/FLU/RSV testing.  Fact Sheet for Patients: EntrepreneurPulse.com.au  Fact Sheet for Healthcare Providers: IncredibleEmployment.be  This test is not yet approved or cleared by the [place] FDA and has been authorized for detection and/or diagnosis of SARS-CoV-2 by FDA under an Emergency Use Authorization (EUA). This EUA will remain in effect (meaning this test can be used) for the duration of the COVID-19 declaration under Section 564(b)(1) of the Act, 21 U.S.C. section 360bbb-3(b)(1), unless the authorization is terminated or revoked.     Resp Syncytial Virus by PCR NEGATIVE NEGATIVE Final    Comment: (NOTE) Fact Sheet for Patients: EntrepreneurPulse.com.au  Fact Sheet for Healthcare Providers: IncredibleEmployment.be  This test is not yet approved or cleared by the [place] FDA  and has been authorized for detection and/or diagnosis of SARS-CoV-2 by FDA under an Emergency Use Authorization (EUA). This EUA will remain in effect (meaning this test can be used) for the duration of the COVID-19 declaration under Section 564(b)(1) of the Act, 21 U.S.C. section 360bbb-3(b)(1), unless the authorization is  terminated or revoked.  Performed at [facility], [place] [address], [place], [place] [address]   Blood Culture (routine x 2)     Status: Abnormal (Preliminary result)   Collection Time: 07/02/22 11:00 PM   Specimen: BLOOD  Result Value Ref Range Status   Specimen Description BLOOD RIGHT ARM  Final   Special Requests   Final    BOTTLES DRAWN AEROBIC AND ANAEROBIC Blood Culture adequate volume   Culture  Setup Time   Final    GRAM NEGATIVE RODS IN BOTH AEROBIC AND ANAEROBIC BOTTLES CRITICAL RESULT CALLED TO, READ BACK BY AND VERIFIED WITH: CATHY PIERCE ON 07/03/22 @ 1520 BY DRT    Culture (A)  Final    ESCHERICHIA COLI SUSCEPTIBILITIES TO FOLLOW Performed at [facility], [place] [address], [place], [place] [address]    Report Status PENDING  Incomplete  Blood Culture ID Panel (Reflexed)     Status: Abnormal   Collection Time: 07/02/22 11:00 PM  Result Value Ref Range Status   Enterococcus faecalis NOT DETECTED NOT DETECTED Final   Enterococcus Faecium NOT DETECTED NOT DETECTED Final   Listeria monocytogenes NOT DETECTED NOT DETECTED Final   Staphylococcus species NOT DETECTED NOT DETECTED Final   Staphylococcus aureus (BCID) NOT DETECTED NOT DETECTED Final   Staphylococcus epidermidis NOT DETECTED NOT DETECTED Final   Staphylococcus lugdunensis NOT DETECTED NOT DETECTED Final   Streptococcus species NOT DETECTED NOT DETECTED Final   Streptococcus agalactiae NOT DETECTED NOT DETECTED Final   Streptococcus pneumoniae NOT DETECTED NOT DETECTED Final   Streptococcus pyogenes NOT DETECTED NOT DETECTED Final   A.calcoaceticus-baumannii NOT DETECTED NOT DETECTED Final   Bacteroides fragilis NOT DETECTED NOT DETECTED Final   Enterobacterales DETECTED (A) NOT DETECTED Final    Comment: Enterobacterales represent a large order of gram negative bacteria, not a single organism. CRITICAL RESULT CALLED TO, READ BACK BY AND VERIFIED WITH: CATHY PIERCE ON 07/03/22 @ 1520 BY DRT     Enterobacter cloacae complex NOT DETECTED NOT DETECTED Final   Escherichia coli DETECTED (A) NOT DETECTED Final    Comment: CRITICAL RESULT CALLED TO, READ BACK BY AND VERIFIED WITH: CATHY PIERCE ON 07/03/22 @ 1520 BY DRT    Klebsiella aerogenes NOT DETECTED NOT DETECTED Final   Klebsiella oxytoca NOT DETECTED NOT DETECTED Final   Klebsiella pneumoniae NOT DETECTED NOT DETECTED Final   Proteus species NOT DETECTED NOT DETECTED Final   Salmonella species NOT DETECTED NOT DETECTED Final   Serratia marcescens NOT DETECTED NOT DETECTED Final   Haemophilus influenzae NOT DETECTED NOT DETECTED Final   Neisseria meningitidis NOT DETECTED NOT DETECTED Final   Pseudomonas aeruginosa NOT DETECTED NOT DETECTED Final   Stenotrophomonas maltophilia NOT DETECTED NOT DETECTED Final   Candida albicans NOT DETECTED NOT DETECTED Final   Candida auris NOT DETECTED NOT DETECTED Final   Candida glabrata NOT DETECTED NOT DETECTED Final   Candida krusei NOT DETECTED NOT DETECTED Final   Candida parapsilosis NOT DETECTED NOT DETECTED Final   Candida tropicalis NOT DETECTED NOT DETECTED Final   Cryptococcus neoformans/gattii NOT DETECTED NOT DETECTED Final   CTX-M ESBL NOT DETECTED NOT DETECTED Final   Carbapenem resistance IMP  NOT DETECTED NOT DETECTED Final   Carbapenem resistance KPC NOT DETECTED NOT DETECTED Final   Carbapenem resistance NDM NOT DETECTED NOT DETECTED Final   Carbapenem resist OXA 48 LIKE NOT DETECTED NOT DETECTED Final   Carbapenem resistance VIM NOT DETECTED NOT DETECTED Final    Comment: Performed at [facility], [place] [address], [place], [place] [address]  Blood Culture (routine x 2)     Status: None (Preliminary result)   Collection Time: 07/03/22  2:20 AM   Specimen: BLOOD  Result Value Ref Range Status   Specimen Description BLOOD LEFT ARM  Final   Special Requests   Final    BOTTLES DRAWN AEROBIC AND ANAEROBIC Blood Culture adequate volume   Culture  Setup Time   Final     GRAM NEGATIVE RODS IN BOTH AEROBIC AND ANAEROBIC BOTTLES CRITICAL VALUE NOTED.  VALUE IS CONSISTENT WITH PREVIOUSLY REPORTED AND CALLED VALUE.    Culture   Final    GRAM NEGATIVE RODS IDENTIFICATION TO FOLLOW Performed at [facility], [place] [address], [place], [place] [address]    Report Status PENDING  Incomplete      Radiology Studies: DG Chest Portable 1 View  Result Date: 07/03/2022 CLINICAL DATA:  Altered mental status EXAM: PORTABLE CHEST 1 VIEW COMPARISON:  03/06/2022 FINDINGS: Prior CABG. Heart is borderline in size. Bibasilar linear atelectasis or scarring. No effusions or edema. No acute bony abnormality. IMPRESSION: Bibasilar atelectasis or scarring. Electronically Signed   By: Rolm Baptise M.D.   On: 07/03/2022 00:16   CT HEAD WO CONTRAST (5MM)  Result Date: 07/02/2022 CLINICAL DATA:  Weakness for 2 days with left facial droop EXAM: CT HEAD WITHOUT CONTRAST TECHNIQUE: Contiguous axial images were obtained from the base of the skull through the vertex without intravenous contrast. RADIATION DOSE REDUCTION: This exam was performed according to the departmental dose-optimization program which includes automated exposure control, adjustment of the mA and/or kV according to patient size and/or use of iterative reconstruction technique. COMPARISON:  02/06/2022 FINDINGS: Brain: No evidence of acute infarction, hemorrhage, hydrocephalus, extra-axial collection or mass lesion/mass effect. Mild atrophic changes and chronic white matter ischemic changes are noted. Vascular: No hyperdense vessel or unexpected calcification. Skull: Normal. Negative for fracture or focal lesion. Sinuses/Orbits: No acute finding. Other: None. IMPRESSION: Chronic atrophic and ischemic changes.  No acute abnormality noted. Electronically Signed   By: Inez Catalina M.D.   On: 07/02/2022 23:45      LOS: 1 day    Cordelia Poche, MD [facility] 07/04/2022, 10:36 AM   If 7PM-7AM, please contact  night-coverage [email or web address]

## 2022-07-04 NOTE — Consult Note (Incomplete)
   [facility] Inpatient Consult   07/04/2022  [name] [birth year] [id number]  [facility] [ACO] Patient:  Primary Care Provider:    Patient is currently active with [facility] for chronic disease management services.  Patient has been engaged by a [facility] ***.  Our community based plan of care has focused on disease management and community resource support.    Patient will receive a post hospital call and will be evaluated for assessments and disease process education.    Plan:   *** Inpatient Transition Of Care [TOC] team member to make aware that [name] [facility] following.   Of note, [facility] Care Management services does not replace or interfere with any services that are needed or arranged by inpatient [facility] care management team.   For additional questions or referrals please contact:  Natividad Brood, RN BSN [place]  [phone] business mobile phone Toll free office [phone]  *[place]  [phone] Fax number: [phone] [name]

## 2022-07-04 NOTE — Assessment & Plan Note (Addendum)
See problem, Acute pyelonephritis.

## 2022-07-04 NOTE — Assessment & Plan Note (Addendum)
Secondary to acute illness. PT/OT recommending SNF. Hoping patient will improve in functional capacity prior to discharge. Recommend discontinuation of Purewick during the day. Patient encouraged to work with nursing for transfer to commode for toileting. Also recommended to have patient up out of bed or sitting up during the day. Patient motivated to not discharge to rehab.

## 2022-07-05 DIAGNOSIS — A4151 Sepsis due to Escherichia coli [E. coli]: Secondary | ICD-10-CM | POA: Diagnosis not present

## 2022-07-05 DIAGNOSIS — N184 Chronic kidney disease, stage 4 (severe): Secondary | ICD-10-CM | POA: Diagnosis not present

## 2022-07-05 DIAGNOSIS — R6520 Severe sepsis without septic shock: Secondary | ICD-10-CM | POA: Diagnosis not present

## 2022-07-05 DIAGNOSIS — N179 Acute kidney failure, unspecified: Secondary | ICD-10-CM | POA: Diagnosis not present

## 2022-07-05 LAB — CBC
HCT: 31.3 % — ABNORMAL LOW (ref 36.0–46.0)
Hemoglobin: 10.4 g/dL — ABNORMAL LOW (ref 12.0–15.0)
MCH: 30.1 pg (ref 26.0–34.0)
MCHC: 33.2 g/dL (ref 30.0–36.0)
MCV: 90.5 fL (ref 80.0–100.0)
Platelets: 247 10*3/uL (ref 150–400)
RBC: 3.46 MIL/uL — ABNORMAL LOW (ref 3.87–5.11)
RDW: 17.7 % — ABNORMAL HIGH (ref 11.5–15.5)
WBC: 13.2 10*3/uL — ABNORMAL HIGH (ref 4.0–10.5)
nRBC: 0.5 % — ABNORMAL HIGH (ref 0.0–0.2)

## 2022-07-05 LAB — URINE CULTURE: Culture: >=100000 — AB

## 2022-07-05 LAB — CULTURE, BLOOD (ROUTINE X 2)
Special Requests: ADEQUATE
Special Requests: ADEQUATE

## 2022-07-05 LAB — BASIC METABOLIC PANEL
Anion gap: 17 — ABNORMAL HIGH (ref 5–15)
BUN: 80 mg/dL — ABNORMAL HIGH (ref 8–23)
CO2: 19 mmol/L — ABNORMAL LOW (ref 22–32)
Calcium: 8.2 mg/dL — ABNORMAL LOW (ref 8.9–10.3)
Chloride: 99 mmol/L (ref 98–111)
Creatinine, Ser: 3.32 mg/dL — ABNORMAL HIGH (ref 0.44–1.00)
GFR, Estimated: 13 mL/min — ABNORMAL LOW (ref >60)
Glucose, Bld: 107 mg/dL — ABNORMAL HIGH (ref 70–99)
Potassium: 3.6 mmol/L (ref 3.5–5.1)
Sodium: 135 mmol/L (ref 135–145)

## 2022-07-05 MED ORDER — CARVEDILOL 6.25 MG PO TABS
6.2500 mg | ORAL_TABLET | Freq: Two times a day (BID) | ORAL | Status: DC
  Administered 2022-07-05 – 2022-07-25 (×41): 6.25 mg via ORAL
  Filled 2022-07-05 (×41): qty 1

## 2022-07-05 MED ORDER — POLYETHYLENE GLYCOL 3350 17 G PO PACK: 17.0000 g | PACK | Freq: Every day | ORAL | Status: DC | PRN

## 2022-07-05 MED ORDER — CEFAZOLIN SODIUM-DEXTROSE 2-4 GM/100ML-% IV SOLN: 2.0000 g | Freq: Two times a day (BID) | INTRAVENOUS | Status: DC

## 2022-07-05 MED ORDER — CEFAZOLIN SODIUM-DEXTROSE 2-4 GM/100ML-% IV SOLN
2.0000 g | Freq: Two times a day (BID) | INTRAVENOUS | Status: DC
  Administered 2022-07-05 – 2022-07-08 (×7): 2 g via INTRAVENOUS
  Filled 2022-07-05 (×8): qty 100

## 2022-07-05 MED ORDER — HYDRALAZINE HCL 20 MG/ML IJ SOLN
5.0000 mg | Freq: Four times a day (QID) | INTRAMUSCULAR | Status: DC | PRN
  Administered 2022-07-05 – 2022-07-09 (×3): 5 mg via INTRAVENOUS
  Filled 2022-07-05 (×3): qty 1

## 2022-07-05 MED ORDER — CARVEDILOL 3.125 MG PO TABS
3.1250 mg | ORAL_TABLET | Freq: Once | ORAL | Status: AC
  Administered 2022-07-05: 3.125 mg via ORAL
  Filled 2022-07-05: qty 1

## 2022-07-05 NOTE — Progress Notes (Signed)
Mobility Specialist Progress Note   07/05/22 1626  Mobility  Activity Ambulated with assistance in hallway  Level of Assistance Contact guard assist, steadying assist  Assistive Device Front wheel walker  Distance Ambulated (ft) 240 ft  Activity Response Tolerated well  Mobility Referral Yes  $Mobility charge 1 Mobility   Pre Mobility: 88 HR, 171/83 BP, 98% SpO2 on RA  During Mobility: 102 HR, 91% SpO2 on RA Post Mobility: 93 HR, 187/82 BP, 95% SpO2  Patient received in bed agreeable to participate in mobility. Ambulated in hallway independently with steady gait but taking x1 standing rest break d/t desaturation, SpO2 88% - 96% throughout ambulation.  Returned to room without complaint or incident. Was left in chair with all needs met, call bell in reach.   Holland Falling Mobility Specialist Please contact via SecureChat or  Rehab office at [phone]

## 2022-07-05 NOTE — TOC Initial Note (Signed)
Transition of Care [facility]) - Initial/Assessment Note    Patient Details  Name: [name] MRN: [id number] Date of Birth: 1940-09-02  Transition of Care [facility]) CM/SW Contact:    Benard Halsted, LCSW Phone Number: 07/05/2022, 9:43 AM  Clinical Narrative:                 CSW spoke with patient and spouse at bedside regarding SNF recommendation. Patient reported that [place] at [place]. Spouse reported understanding and would like her home with him but does report that she needs to be able to walk. CSW alerted them that per Joellen Jersey at [facility], they would have a bed for her if she would like a SNF stay. CSW will continue to follow.      Barriers to Discharge: Continued Medical Work up   Patient Goals and CMS Choice Patient states their goals for this hospitalization and ongoing recovery are:: Rehab CMS Medicare.gov Compare Post Acute Care list provided to:: Patient Choice offered to / list presented to : Patient, [place] ownership interest in [facility].provided to:: Patient    Expected Discharge Plan and Services In-house Referral: Clinical Social Work     Living arrangements for the past 2 months: [place]                                      Prior Living Arrangements/Services Living arrangements for the past 2 months: [place] Lives with:: Spouse Patient language and need for interpreter reviewed:: Yes Do you feel safe going back to the place where you live?: Yes      Need for Family Participation in Patient Care: No (Comment) Care giver support system in place?: Yes (comment)   Criminal Activity/Legal Involvement Pertinent to Current Situation/Hospitalization: No - Comment as needed  Activities of Daily Living      Permission Sought/Granted Permission sought to share information with : Facility Sport and exercise psychologist, Family Supports Permission  granted to share information with : Yes, Verbal Permission Granted  Share Information with NAME: Lanae Boast  Permission granted to share info w AGENCY: [facility]  Permission granted to share info w Relationship: Spouse  Permission granted to share info w Contact Information: [phone]  Emotional Assessment Appearance:: Appears stated age Attitude/Demeanor/Rapport: Engaged Affect (typically observed): Accepting, Appropriate Orientation: : Oriented to Self, Oriented to Place, Oriented to  Time, Oriented to Situation Alcohol / Substance Use: Not Applicable Psych Involvement: No (comment)  Admission diagnosis:  Acute cystitis without hematuria [N30.00] Elevated troponin I level [R79.89] AKI (acute kidney injury) ([place]) [N17.9] Altered mental status, unspecified altered mental status type [R41.82] Sepsis, due to unspecified organism, unspecified whether acute organ dysfunction present ([place]) [A41.9] Sepsis without acute organ dysfunction, due to unspecified organism [facility]) [A41.9] Patient Active Problem List   Diagnosis Date Noted   E coli bacteremia 07/04/2022   Acute pyelonephritis [id number]   Acute metabolic encephalopathy [id number]   Hyponatremia 07/03/2022   Sepsis with acute renal failure ([place]) 07/03/2022   DNR (do not resuscitate)/DNI(Do Not Intubate) 07/03/2022   Generalized weakness 05/23/2022   Gallstones 03/17/2022   Acute renal failure superimposed on stage 4 chronic kidney disease ([place]) 0000000   Metabolic acidosis, increased anion gap 03/06/2022   Hip dislocation, left ([place]) 03/06/2022   History of anemia due to chronic kidney disease 03/06/2022   History of DVT (  deep vein thrombosis) 03/06/2022   Allergic rhinitis 03/06/2022   History of pulmonary embolism 02/06/2022   Vitamin D deficiency 01/30/2022   Slow transit constipation 01/30/2022   GERD (gastroesophageal reflux disease) 01/30/2022   Status post total replacement of left hip 01/23/2022   CKD (chronic  kidney disease) stage 4, GFR 15-29 ml/min (HCC) - baseline SCr 2.5. follows with Dr. Royce Macadamia with [facility] 11/17/2021   Primary osteoarthritis of left hip 10/06/2021   Left hip pain 12/09/2020   Aortic atherosclerosis ([place]) 09/07/2020   Gait disorder 06/08/2020   Neoplasm of uncertain behavior of skin 03/14/2020   Arthralgia 12/25/2019   Ankle swelling 08/08/2019   Osteoporosis 02/26/2018   Colon polyp 06/28/2016   COPD (chronic obstructive pulmonary disease) ([place]) 05/28/2016   Upper airway cough syndrome 05/22/2016   Wrist pain, right 12/14/2015   Lung mass 03/02/2015   Pre-operative cardiovascular examination 02/21/2015   DOE (dyspnea on exertion) 02/16/2015   TIA (transient ischemic attack) 12/31/2014   Abnormal mental state 12/30/2014   Well adult exam 11/26/2014   Seasonal allergies 12/31/2013   Splenic artery aneurysm ([place]) 04/07/2013   Cerumen impaction 03/20/2013   Hyperlipidemia    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   Long term (current) use of anticoagulants 11/08/2010   Rheumatoid arthritis ([place]) 06/23/2010   Pain in joint 11/26/2009   Acquired hypothyroidism 12/08/2006   Macular degeneration (senile) of retina 12/08/2006   Essential hypertension 12/08/2006   Diaphragmatic hernia 12/08/2006   ASPLENIA 12/08/2006   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Phone: [phone]: [phone]    Social Determinants of Health (SDOH) Social History: SDOH Screenings   Food Insecurity: No Food Insecurity (06/13/2022)  Housing: Low Risk  (06/13/2022)  Transportation Needs: No Transportation Needs (06/13/2022)  Utilities: Not At Risk (06/13/2022)  Alcohol Screen: Low Risk  (05/30/2021)  Depression (PHQ2-9): Low Risk  (06/15/2022)  Financial Resource Strain: Low Risk  (06/13/2022)  Physical Activity: Inactive (06/13/2022)  Social Connections: Unknown (06/13/2022)  Stress: No Stress Concern Present (06/13/2022)  Tobacco Use: Medium Risk (07/03/2022)   SDOH Interventions:     Readmission Risk Interventions    07/05/2022    9:42 AM  Readmission Risk Prevention Plan  Transportation Screening Complete  Medication Review ([place] Complete  PCP or Specialist appointment within 3-5 days of discharge Complete  HRI or HN[place]Complete  SW Recovery Care/Counseling Consult Complete  Palliative Care Screening Not AWoodacreComplete

## 2022-07-05 NOTE — NC FL2 (Signed)
[place] LEVEL OF CARE FORM     IDENTIFICATION  Patient Name: [name] Birthdate: 05/23/1940 Sex: female Admission Date (Current Location): 07/02/2022  [facility] and [place] Number:  Herbalist and Address:  [facility], [place] [address], [place], [place] [address]      Provider Number: [id number]  Attending Physician Name and Address:  Elgergawy, Silver Huguenin, MD  Relative Name and Phone Number:       Current Level of Care: Hospital Recommended Level of Care: [place] Prior Approval Number:    Date Approved/Denied:   PASRR Number: [id number]  Discharge Plan: SNF    Current Diagnoses: Patient Active Problem List   Diagnosis Date Noted   E coli bacteremia 07/04/2022   Acute pyelonephritis [id number]   Acute metabolic encephalopathy [id number]   Hyponatremia 07/03/2022   Sepsis with acute renal failure ([place]) 07/03/2022   DNR (do not resuscitate)/DNI(Do Not Intubate) 07/03/2022   Generalized weakness 05/23/2022   Gallstones 03/17/2022   Acute renal failure superimposed on stage 4 chronic kidney disease ([place]) 0000000   Metabolic acidosis, increased anion gap 03/06/2022   Hip dislocation, left ([place]) 03/06/2022   History of anemia due to chronic kidney disease 03/06/2022   History of DVT (deep vein thrombosis) 03/06/2022   Allergic rhinitis 03/06/2022   History of pulmonary embolism 02/06/2022   Vitamin D deficiency 01/30/2022   Slow transit constipation 01/30/2022   GERD (gastroesophageal reflux disease) 01/30/2022   Status post total replacement of left hip 01/23/2022   CKD (chronic kidney disease) stage 4, GFR 15-29 ml/min (HCC) - baseline SCr 2.5. follows with Dr. Royce Macadamia with [facility] 11/17/2021   Primary osteoarthritis of left hip 10/06/2021   Left hip pain 12/09/2020   Aortic atherosclerosis ([place]) 09/07/2020   Gait disorder 06/08/2020   Neoplasm of uncertain behavior of skin 03/14/2020    Arthralgia 12/25/2019   Ankle swelling 08/08/2019   Osteoporosis 02/26/2018   Colon polyp 06/28/2016   COPD (chronic obstructive pulmonary disease) ([place]) 05/28/2016   Upper airway cough syndrome 05/22/2016   Wrist pain, right 12/14/2015   Lung mass 03/02/2015   Pre-operative cardiovascular examination 02/21/2015   DOE (dyspnea on exertion) 02/16/2015   TIA (transient ischemic attack) 12/31/2014   Abnormal mental state 12/30/2014   Well adult exam 11/26/2014   Seasonal allergies 12/31/2013   Splenic artery aneurysm ([place]) 04/07/2013   Cerumen impaction 03/20/2013   Hyperlipidemia    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   Long term (current) use of anticoagulants 11/08/2010   Rheumatoid arthritis ([place]) 06/23/2010   Pain in joint 11/26/2009   Acquired hypothyroidism 12/08/2006   Macular degeneration (senile) of retina 12/08/2006   Essential hypertension 12/08/2006   Diaphragmatic hernia 12/08/2006   ASPLENIA 12/08/2006   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) 12/22/1994   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; now atretic and significantly improved LAD lesion without intervention 12/22/1994    Orientation RESPIRATION BLADDER Height & Weight     Self, Time, Situation, Place  Normal Continent Weight: 152 lb 12.5 oz (69.3 kg) Height:  '5\' 3"'$  (160 cm)  BEHAVIORAL SYMPTOMS/MOOD NEUROLOGICAL BOWEL NUTRITION STATUS      Continent Diet (See DC Summary)  AMBULATORY STATUS COMMUNICATION OF NEEDS Skin   Limited Assist Verbally Normal  Personal Care Assistance Level of Assistance  Bathing, Feeding, Dressing Bathing Assistance: Limited assistance Feeding assistance: Independent Dressing Assistance: Limited assistance     Functional Limitations Info             SPECIAL CARE FACTORS FREQUENCY  PT (By licensed PT), OT (By licensed OT)     PT Frequency: Eval and treat OT Frequency:  Eval and treat            Contractures Contractures Info: Not present    Additional Factors Info  Code Status, Allergies Code Status Info: DNR Allergies Info: Nsaids, Aspirin, Codeine, Nitrostat (Nitroglycerin)           Current Medications (07/05/2022):  This is the current hospital active medication list Current Facility-Administered Medications  Medication Dose Route Frequency Provider Last Rate Last Admin   0.9 %  sodium chloride infusion   Intravenous Continuous Kristopher Oppenheim, DO 75 mL/hr at 07/04/22 2333 New Bag at 07/04/22 2333   acetaminophen (TYLENOL) tablet 650 mg  650 mg Oral Q6H PRN Kristopher Oppenheim, DO   650 mg at 07/03/22 1027   Or   acetaminophen (TYLENOL) suppository 650 mg  650 mg Rectal Q6H PRN Kristopher Oppenheim, DO       apixaban Arne [place]) tablet 2.5 mg  2.5 mg Oral BID Kristopher Oppenheim, DO   2.5 mg at 07/05/22 0857   atorvastatin (LIPITOR) tablet 80 mg  80 mg Oral Daily Kristopher Oppenheim, DO   80 mg at 07/05/22 0856   carvedilol (COREG) tablet 3.125 mg  3.125 mg Oral BID WC Mariel Aloe, MD   3.125 mg at 07/05/22 0857   ceFAZolin (ANCEF) IVPB 2g/100 mL premix  2 g Intravenous Q12H Elgergawy, Silver Huguenin, MD       hydrALAZINE (APRESOLINE) injection 5 mg  5 mg Intravenous Q6H PRN Elgergawy, Silver Huguenin, MD   5 mg at 07/05/22 [id number]   hydrALAZINE (APRESOLINE) tablet 25 mg  25 mg Oral Q6H PRN Mariel Aloe, MD       levothyroxine (SYNTHROID) tablet 88 mcg  88 mcg Oral QAC breakfast Kristopher Oppenheim, DO   88 mcg at 07/05/22 0542   ondansetron (ZOFRAN) tablet 4 mg  4 mg Oral Q6H PRN Kristopher Oppenheim, DO       Or   ondansetron [facility]) injection 4 mg  4 mg Intravenous Q6H PRN Kristopher Oppenheim, DO       polyethylene glycol (MIRALAX / GLYCOLAX) packet 17 g  17 g Oral Daily PRN Elgergawy, Silver Huguenin, MD       triamcinolone (NASACORT) nasal inhaler 1 spray  1 spray Each Nare Daily Kristopher Oppenheim, DO   1 spray at 07/05/22 [id number]     Discharge Medications: Please see discharge summary for a list of discharge  medications.  Relevant Imaging Results:  Relevant Lab Results:   Additional Information SSN: [id number]  [place], LCSW

## 2022-07-05 NOTE — Care Management Important Message (Signed)
Important Message  Patient Details  Name: [name] MRN: [id number] Date of Birth: [birth year]   Medicare Important Message Given:  Yes     Orbie Pyo 07/05/2022, 2:42 PM

## 2022-07-05 NOTE — Plan of Care (Signed)

## 2022-07-05 NOTE — Progress Notes (Addendum)
PROGRESS NOTE    [name]  [id number] DOB: February 07, 1941 DOA: 07/02/2022 PCP: Cassandria Anger, MD    Chief Complaint  Patient presents with   Extremity Weakness    Brief Narrative:  [name] is a 82 y.o. female with a history of CKD stage IV, hypertension, rheumatoid arthritis, hypothyroidism, CAD s/p CABG, COPD, hyperlipidemia, DVT/PE. Patient presented secondary to weakness and fever and is found to have evidence of sepsis with a likely urinary source. Empiric antibiotics started and cultures obtained. Patient also found to have hyponatremia with associated dehydration and AKI. IV fluids started. Blood cultures significant for E. Coli bacteremia. Renal function improving with IV fluids.    Assessment & Plan:   Principal Problem:   Acute pyelonephritis Active Problems:   Acute renal failure superimposed on stage 4 chronic kidney disease (HCC)   Acute metabolic encephalopathy   Hyponatremia   Sepsis with acute renal failure (HCC)   Acquired hypothyroidism   Essential hypertension   Rheumatoid arthritis ([place])   Long term (current) use of anticoagulants   Hx of CABG   Hyperlipidemia   COPD (chronic obstructive pulmonary disease) (HCC)   CKD (chronic kidney disease) stage 4, GFR 15-29 ml/min (HCC) - baseline SCr 2.5. follows with Dr. Royce Macadamia with [facility]   History of pulmonary embolism   Metabolic acidosis, increased anion gap   Generalized weakness   DNR (do not resuscitate)/DNI(Do Not Intubate)   E coli bacteremia   Sepsis due to acute hemorrhagic cystitis with E. coli bacteremia Sepsis present on admission - Patient with non-specific symptoms but with associated fever and leukocytosis. No flank pain, . Urinalysis suggests infection, confirmed on urine culture. Patient started empirically on Ceftriaxone IV. Associated E. Coli bacteremia -on IV  Ceftriaxone 2 g IV, now sensitive with, will transition to IV cephalo-Juliane given available  sensitivity -Follow-up blood and urine culture data   Sepsis with acute renal failure ([place]) Present on admission. Associated fever and leukocytosis with WBC of 23,800 on admission. Urinary source with associated E. Coli bacteremia.   Hyponatremia Sodium of 124 on admission. Likely related to dehydration and hypovolemia, present on admission. Patient started on IV fluids with initial improvement in sodium to 135.   Acute metabolic encephalopathy Appears to be improved today with IV fluids. Likely related to overall illness but more specifically dehydration and hyponatremia. Patient is from independent living.   Acute renal failure superimposed on stage 4 chronic kidney disease (HCC) - baseline SCr 2.5. follows with Dr. Royce Macadamia with [facility] Creatinine of 4.70 on admission secondary to poor oral intake in addition to patient continuing diuretic use. Creatinine improved to 3.69 with IV fluids -Continue IV fluids -Transition off renal diet since having continued improvement of renal function -Daily BMP   History of pulmonary embolism -Continue Eliquis   COPD (chronic obstructive pulmonary disease) (HCC) Stable. Asymptomatic.   Hyperlipidemia -Continue Lipitor daily   Hx of CABG Noted. No chest pain.   Long term (current) use of anticoagulants Noted. Patient is on Eliquis as an outpatient for the indication of PE/DVT which does not qualify for dose reduction, however patient is on reduced dose of 2.5 mg BID as an outpatient; possibly related to history of GI bleeding. -Resume home dose of Eliquis 2.5 mg BID   Rheumatoid arthritis ([place]) Patient is on [place] as an outpatient which is held secondary to active infection.   Essential hypertension Patient is managed on Coreg as an outpatient. Uncontrolled while admitted. -Resume home Coreg,  Remains uncontrolled, will increase Coreg dose, if remains elevated will consider adding Norvasc. -Add hydralazine PO PRN; may need to add  second antihypertensive   Acquired hypothyroidism -Continue Synthroid 88 mcg daily   Generalized weakness Secondary to acute illness. PT/OT recommending SNF. Hoping patient will improve in functional capacity prior to discharge. Recommend discontinuation of Purewick during the day. Patient encouraged to work with nursing for transfer to commode for toileting. Also recommended to have patient up out of bed or sitting up during the day. Patient motivated to not discharge to rehab.   Metabolic acidosis, increased anion gap Secondary to AKI. Expect this to improve. -If acidosis worsens, start sodium bicarbonate tablets    DVT prophylaxis: Eliquis Code Status: DNR Family Communication: D/W son at bedside Disposition:   Status is: Inpatient    Consultants:  none   Subjective:  She still reports poor appetite, but has been improving, she denies any fever or chills.  Objective: Vitals:   07/04/22 2300 07/05/22 0503 07/05/22 0550 07/05/22 0900  BP: (!) 153/102 (!) 175/68  (!) 198/73  Pulse: 92 97    Resp: 19 20    Temp: 98.2 F (36.8 C) 97.9 F (36.6 C)  98 F (36.7 C)  TempSrc: Oral Oral  Oral  SpO2: 93% 93%    Weight:   69.3 kg   Height:       No intake or output data in the 24 hours ending 07/05/22 1113 Filed Weights   07/02/22 2309 07/05/22 0550  Weight: 63.5 kg 69.3 kg    Examination:  Awake Alert, Oriented X 3, frail, deconditioned Symmetrical Chest wall movement, Good air movement bilaterally, CTAB RRR,No Gallops,Rubs or new Murmurs, No Parasternal Heave +ve B.Sounds, Abd Soft, No tenderness, no CVA tenderness No Cyanosis, Clubbing or edema, No new Rash or bruise      Data Reviewed: I have personally reviewed following labs and imaging studies  CBC: Recent Labs  Lab 07/02/22 2329 07/02/22 2337 07/04/22 0709 07/05/22 0659  WBC 23.8*  --  20.1* 13.2*  NEUTROABS 20.1*  --  16.2*  --   HGB 11.8* 12.9 11.0* 10.4*  HCT 33.0* 38.0 31.4* 31.3*  MCV 89.2   --  89.0 90.5  PLT 198  --  204 [id number]    Basic Metabolic Panel: Recent Labs  Lab 07/02/22 2337 07/03/22 0033 07/04/22 0709 07/05/22 0659  NA 124* 128* 134* 135  K 5.9* 4.3 3.5 3.6  CL 91* 92* 100 99  CO2  --  19* 18* 19*  GLUCOSE 168* 166* 78 107*  BUN 118* 94* 82* 80*  CREATININE 4.70* 4.09* 3.69* 3.32*  CALCIUM  --  8.6* 8.0* 8.2*  MG  --   --  1.8  --     GFR: Estimated Creatinine Clearance: 11.8 mL/min (A) (by C-G formula based on SCr of 3.32 mg/dL (H)).  Liver Function Tests: Recent Labs  Lab 07/03/22 0033 07/04/22 0709  AST 38 25  ALT 20 17  ALKPHOS 95 93  BILITOT 1.0 0.7  PROT 5.4* 5.4*  ALBUMIN 2.5* 2.2*    CBG: Recent Labs  Lab 07/03/22 0632  GLUCAP 136*     Recent Results (from the past 240 hour(s))  Resp panel by RT-PCR (RSV, Flu A&B, Covid) Peripheral     Status: None   Collection Time: 07/02/22 11:00 PM   Specimen: Peripheral; Nasal Swab  Result Value Ref Range Status   SARS Coronavirus 2 by RT PCR NEGATIVE NEGATIVE Final   Influenza  A by PCR NEGATIVE NEGATIVE Final   Influenza B by PCR NEGATIVE NEGATIVE Final    Comment: (NOTE) The Xpert Xpress SARS-CoV-2/FLU/RSV plus assay is intended as an aid in the diagnosis of influenza from Nasopharyngeal swab specimens and should not be used as a sole basis for treatment. Nasal washings and aspirates are unacceptable for Xpert Xpress SARS-CoV-2/FLU/RSV testing.  Fact Sheet for Patients: EntrepreneurPulse.com.au  Fact Sheet for Healthcare Providers: IncredibleEmployment.be  This test is not yet approved or cleared by the [place] FDA and has been authorized for detection and/or diagnosis of SARS-CoV-2 by FDA under an Emergency Use Authorization (EUA). This EUA will remain in effect (meaning this test can be used) for the duration of the COVID-19 declaration under Section 564(b)(1) of the Act, 21 U.S.C. section 360bbb-3(b)(1), unless the authorization is  terminated or revoked.     Resp Syncytial Virus by PCR NEGATIVE NEGATIVE Final    Comment: (NOTE) Fact Sheet for Patients: EntrepreneurPulse.com.au  Fact Sheet for Healthcare Providers: IncredibleEmployment.be  This test is not yet approved or cleared by the [place] FDA and has been authorized for detection and/or diagnosis of SARS-CoV-2 by FDA under an Emergency Use Authorization (EUA). This EUA will remain in effect (meaning this test can be used) for the duration of the COVID-19 declaration under Section 564(b)(1) of the Act, 21 U.S.C. section 360bbb-3(b)(1), unless the authorization is terminated or revoked.  Performed at [facility], [place] [address] [place], [place] [address]   Blood Culture (routine x 2)     Status: Abnormal   Collection Time: 07/02/22 11:00 PM   Specimen: BLOOD  Result Value Ref Range Status   Specimen Description BLOOD RIGHT ARM  Final   Special Requests   Final    BOTTLES DRAWN AEROBIC AND ANAEROBIC Blood Culture adequate volume   Culture  Setup Time   Final    GRAM NEGATIVE RODS IN BOTH AEROBIC AND ANAEROBIC BOTTLES CRITICAL RESULT CALLED TO, READ BACK BY AND VERIFIED WITH: CATHY PIERCE ON 07/03/22 @ 1520 BY DRT Performed at [facility], [place] [address], [place], [place] [address]    Culture ESCHERICHIA COLI (A)  Final   Report Status 07/05/2022 FINAL  Final   Organism ID, Bacteria ESCHERICHIA COLI  Final      Susceptibility   Escherichia coli - MIC*    AMPICILLIN >=32 RESISTANT Resistant     CEFEPIME <=0.12 SENSITIVE Sensitive     CEFTAZIDIME <=1 SENSITIVE Sensitive     CEFTRIAXONE <=0.25 SENSITIVE Sensitive     CIPROFLOXACIN <=0.25 SENSITIVE Sensitive     GENTAMICIN <=1 SENSITIVE Sensitive     IMIPENEM <=0.25 SENSITIVE Sensitive     TRIMETH/SULFA <=20 SENSITIVE Sensitive     AMPICILLIN/SULBACTAM 8 SENSITIVE Sensitive     PIP/TAZO <=4 SENSITIVE Sensitive     * ESCHERICHIA COLI  Blood  Culture ID Panel (Reflexed)     Status: Abnormal   Collection Time: 07/02/22 11:00 PM  Result Value Ref Range Status   Enterococcus faecalis NOT DETECTED NOT DETECTED Final   Enterococcus Faecium NOT DETECTED NOT DETECTED Final   Listeria monocytogenes NOT DETECTED NOT DETECTED Final   Staphylococcus species NOT DETECTED NOT DETECTED Final   Staphylococcus aureus (BCID) NOT DETECTED NOT DETECTED Final   Staphylococcus epidermidis NOT DETECTED NOT DETECTED Final   Staphylococcus lugdunensis NOT DETECTED NOT DETECTED Final   Streptococcus species NOT DETECTED NOT DETECTED Final   Streptococcus agalactiae NOT DETECTED NOT DETECTED Final   Streptococcus  pneumoniae NOT DETECTED NOT DETECTED Final   Streptococcus pyogenes NOT DETECTED NOT DETECTED Final   A.calcoaceticus-baumannii NOT DETECTED NOT DETECTED Final   Bacteroides fragilis NOT DETECTED NOT DETECTED Final   Enterobacterales DETECTED (A) NOT DETECTED Final    Comment: Enterobacterales represent a large order of gram negative bacteria, not a single organism. CRITICAL RESULT CALLED TO, READ BACK BY AND VERIFIED WITH: CATHY PIERCE ON 07/03/22 @ 1520 BY DRT    Enterobacter cloacae complex NOT DETECTED NOT DETECTED Final   Escherichia coli DETECTED (A) NOT DETECTED Final    Comment: CRITICAL RESULT CALLED TO, READ BACK BY AND VERIFIED WITH: CATHY PIERCE ON 07/03/22 @ 1520 BY DRT    Klebsiella aerogenes NOT DETECTED NOT DETECTED Final   Klebsiella oxytoca NOT DETECTED NOT DETECTED Final   Klebsiella pneumoniae NOT DETECTED NOT DETECTED Final   Proteus species NOT DETECTED NOT DETECTED Final   Salmonella species NOT DETECTED NOT DETECTED Final   Serratia marcescens NOT DETECTED NOT DETECTED Final   Haemophilus influenzae NOT DETECTED NOT DETECTED Final   Neisseria meningitidis NOT DETECTED NOT DETECTED Final   Pseudomonas aeruginosa NOT DETECTED NOT DETECTED Final   Stenotrophomonas maltophilia NOT DETECTED NOT DETECTED Final   Candida  albicans NOT DETECTED NOT DETECTED Final   Candida auris NOT DETECTED NOT DETECTED Final   Candida glabrata NOT DETECTED NOT DETECTED Final   Candida krusei NOT DETECTED NOT DETECTED Final   Candida parapsilosis NOT DETECTED NOT DETECTED Final   Candida tropicalis NOT DETECTED NOT DETECTED Final   Cryptococcus neoformans/gattii NOT DETECTED NOT DETECTED Final   CTX-M ESBL NOT DETECTED NOT DETECTED Final   Carbapenem resistance IMP NOT DETECTED NOT DETECTED Final   Carbapenem resistance KPC NOT DETECTED NOT DETECTED Final   Carbapenem resistance NDM NOT DETECTED NOT DETECTED Final   Carbapenem resist OXA 48 LIKE NOT DETECTED NOT DETECTED Final   Carbapenem resistance VIM NOT DETECTED NOT DETECTED Final    Comment: Performed at [facility], [place] [address], [place], [place] [address]  Urine Culture (for pregnant, neutropenic or urologic patients or patients with an indwelling urinary catheter)     Status: Abnormal   Collection Time: 07/02/22 11:29 PM   Specimen: Urine, Catheterized  Result Value Ref Range Status   Specimen Description URINE, CATHETERIZED  Final   Special Requests   Final    NONE Performed at [facility], [place] [address] [place], [place] [address]    Culture (A)  Final    >=100,000 COLONIES/mL ESCHERICHIA COLI 80,000 COLONIES/mL KLEBSIELLA PNEUMONIAE    Report Status 07/05/2022 FINAL  Final   Organism ID, Bacteria ESCHERICHIA COLI (A)  Final   Organism ID, Bacteria KLEBSIELLA PNEUMONIAE (A)  Final      Susceptibility   Escherichia coli - MIC*    AMPICILLIN >=32 RESISTANT Resistant     CEFAZOLIN <=4 SENSITIVE Sensitive     CEFEPIME <=0.12 SENSITIVE Sensitive     CEFTRIAXONE <=0.25 SENSITIVE Sensitive     CIPROFLOXACIN <=0.25 SENSITIVE Sensitive     GENTAMICIN <=1 SENSITIVE Sensitive     IMIPENEM <=0.25 SENSITIVE Sensitive     NITROFURANTOIN <=16 SENSITIVE Sensitive     TRIMETH/SULFA <=20 SENSITIVE Sensitive     AMPICILLIN/SULBACTAM 4 SENSITIVE  Sensitive     PIP/TAZO <=4 SENSITIVE Sensitive     * >=100,000 COLONIES/mL ESCHERICHIA COLI   Klebsiella pneumoniae - MIC*    AMPICILLIN RESISTANT Resistant     CEFAZOLIN <=4 SENSITIVE Sensitive  CEFEPIME <=0.12 SENSITIVE Sensitive     CEFTRIAXONE <=0.25 SENSITIVE Sensitive     CIPROFLOXACIN <=0.25 SENSITIVE Sensitive     GENTAMICIN <=1 SENSITIVE Sensitive     IMIPENEM <=0.25 SENSITIVE Sensitive     NITROFURANTOIN 32 SENSITIVE Sensitive     TRIMETH/SULFA <=20 SENSITIVE Sensitive     AMPICILLIN/SULBACTAM <=2 SENSITIVE Sensitive     PIP/TAZO <=4 SENSITIVE Sensitive     * 80,000 COLONIES/mL KLEBSIELLA PNEUMONIAE  Blood Culture (routine x 2)     Status: Abnormal   Collection Time: 07/03/22  2:20 AM   Specimen: BLOOD  Result Value Ref Range Status   Specimen Description BLOOD LEFT ARM  Final   Special Requests   Final    BOTTLES DRAWN AEROBIC AND ANAEROBIC Blood Culture adequate volume   Culture  Setup Time   Final    GRAM NEGATIVE RODS IN BOTH AEROBIC AND ANAEROBIC BOTTLES CRITICAL VALUE NOTED.  VALUE IS CONSISTENT WITH PREVIOUSLY REPORTED AND CALLED VALUE.    Culture (A)  Final    ESCHERICHIA COLI SUSCEPTIBILITIES PERFORMED ON PREVIOUS CULTURE WITHIN THE LAST 5 DAYS. Performed at [facility], [place] [address], [place],  [address]    Report Status 07/05/2022 FINAL  Final         Radiology Studies: No results found.      Scheduled Meds:  apixaban  2.5 mg Oral BID   atorvastatin  80 mg Oral Daily   carvedilol  3.125 mg Oral BID WC   levothyroxine  88 mcg Oral QAC breakfast   triamcinolone  1 spray Each Nare Daily   Continuous Infusions:  sodium chloride 75 mL/hr at 07/04/22 2333    ceFAZolin (ANCEF) IV       LOS: 2 days      Phillips Climes, MD [facility]   To contact the attending provider between 7A-7P or the covering provider during after hours 7P-7A, please log into the web site [email or web address] and access using universal [facility] password for that web site. If you do not have the password, please call the hospital operator.  07/05/2022, 11:13 AM

## 2022-07-06 DIAGNOSIS — N10 Acute tubulo-interstitial nephritis: Secondary | ICD-10-CM | POA: Diagnosis not present

## 2022-07-06 DIAGNOSIS — I10 Essential (primary) hypertension: Secondary | ICD-10-CM | POA: Diagnosis not present

## 2022-07-06 LAB — CBC
HCT: 29.1 % — ABNORMAL LOW (ref 36.0–46.0)
Hemoglobin: 10.2 g/dL — ABNORMAL LOW (ref 12.0–15.0)
MCH: 31.5 pg (ref 26.0–34.0)
MCHC: 35.1 g/dL (ref 30.0–36.0)
MCV: 89.8 fL (ref 80.0–100.0)
Platelets: 246 10*3/uL (ref 150–400)
RBC: 3.24 MIL/uL — ABNORMAL LOW (ref 3.87–5.11)
RDW: 17.9 % — ABNORMAL HIGH (ref 11.5–15.5)
WBC: 13.1 10*3/uL — ABNORMAL HIGH (ref 4.0–10.5)
nRBC: 0.5 % — ABNORMAL HIGH (ref 0.0–0.2)

## 2022-07-06 LAB — BASIC METABOLIC PANEL
Anion gap: 11 (ref 5–15)
BUN: 70 mg/dL — ABNORMAL HIGH (ref 8–23)
CO2: 19 mmol/L — ABNORMAL LOW (ref 22–32)
Calcium: 8.4 mg/dL — ABNORMAL LOW (ref 8.9–10.3)
Chloride: 103 mmol/L (ref 98–111)
Creatinine, Ser: 2.93 mg/dL — ABNORMAL HIGH (ref 0.44–1.00)
GFR, Estimated: 15 mL/min — ABNORMAL LOW (ref >60)
Glucose, Bld: 116 mg/dL — ABNORMAL HIGH (ref 70–99)
Potassium: 3.2 mmol/L — ABNORMAL LOW (ref 3.5–5.1)
Sodium: 133 mmol/L — ABNORMAL LOW (ref 135–145)

## 2022-07-06 MED ORDER — AMLODIPINE BESYLATE 5 MG PO TABS
5.0000 mg | ORAL_TABLET | Freq: Every day | ORAL | Status: DC
  Administered 2022-07-06: 5 mg via ORAL
  Filled 2022-07-06: qty 1

## 2022-07-06 MED ORDER — AMLODIPINE BESYLATE 10 MG PO TABS: 10.0000 mg | ORAL_TABLET | Freq: Every day | ORAL | Status: DC

## 2022-07-06 MED ORDER — WITCH HAZEL-GLYCERIN EX PADS
1.0000 | MEDICATED_PAD | CUTANEOUS | Status: DC | PRN
  Filled 2022-07-06: qty 100

## 2022-07-06 MED ORDER — POTASSIUM CHLORIDE CRYS ER 20 MEQ PO TBCR
40.0000 meq | EXTENDED_RELEASE_TABLET | Freq: Four times a day (QID) | ORAL | Status: AC
  Administered 2022-07-06 (×2): 40 meq via ORAL
  Filled 2022-07-06: qty 2

## 2022-07-06 MED ORDER — AMLODIPINE BESYLATE 5 MG PO TABS
5.0000 mg | ORAL_TABLET | Freq: Once | ORAL | Status: AC
  Administered 2022-07-06: 5 mg via ORAL
  Filled 2022-07-06: qty 1

## 2022-07-06 NOTE — Progress Notes (Signed)
Physical Therapy Treatment Patient Details Name: [name] MRN: [id number] DOB: 1940/11/11 Today's Date: 07/06/2022   History of Present Illness 82 y.o. female presents to [facility] hospital on 07/02/2022 with worsening weakness. Pt admitted for management of acute cystitis. PMH includes CKD IV, HTN, RA, hypothyroidism, CABG, COPD, HLD, PE.    PT Comments    Pt tolerated today's session well, continuing to progress with therapy with improved balance with ambulation and obstacle negotiation, although continuing to require cueing intermittently. Pt cued for proper hand placement with sit<>stand transfers, good carryover noted with repetition. Continuing to require minG throughout mobility for safety and balance but no overt LOB noted today. Pt reports being very independent prior to admission, continuing to request SNF upon discharge to maximize mobility gains prior to discharge. Acute PT will continue to follow to progress mobility, discharge plans remain appropriate but anticipate pt may progress to home with continued progression in therapy. Will follow as appropriate.     Recommendations for follow up therapy are one component of a multi-disciplinary discharge planning process, led by the attending physician.  Recommendations may be updated based on patient status, additional functional criteria and insurance authorization.  Follow Up Recommendations  Skilled nursing-short term rehab (<3 hours/day) Can patient physically be transported by private vehicle: Yes   Assistance Recommended at Discharge Intermittent Supervision/Assistance  Patient can return home with the following Assistance with cooking/housework;Assist for transportation;Help with stairs or ramp for entrance;Direct supervision/assist for medications management;Direct supervision/assist for financial management;A little help with walking and/or transfers;A little help with bathing/dressing/bathroom   Equipment Recommendations  None  recommended by PT    Recommendations for Other Services       Precautions / Restrictions Precautions Precautions: Fall Restrictions Weight Bearing Restrictions: No     Mobility  Bed Mobility               General bed mobility comments: pt in recliner upon arrival, ended session in recliner    Transfers Overall transfer level: Needs assistance Equipment used: Rolling walker (2 wheels) Transfers: Sit to/from Stand Sit to Stand: Min guard           General transfer comment: minG for sit<>stand transfers with cues for hand placement    Ambulation/Gait Ambulation/Gait assistance: Min guard Gait Distance (Feet): 120 Feet Assistive device: Rolling walker (2 wheels) Gait Pattern/deviations: Step-through pattern, Decreased stride length, Decreased dorsiflexion - right, Decreased dorsiflexion - left Gait velocity: decreased     General Gait Details: downwards gaze with cues for forwards gaze to anticipate obstacle negotiation, only 1 instance of poor obstacle negotiation with today's ambulation   Stairs             Wheelchair Mobility    Modified Rankin (Stroke Patients Only)       Balance Overall balance assessment: Needs assistance Sitting-balance support: Feet supported, No upper extremity supported Sitting balance-Leahy Scale: Fair Sitting balance - Comments: stable sitting balance   Standing balance support: Bilateral upper extremity supported, During functional activity Standing balance-Leahy Scale: Poor Standing balance comment: reliant on RW for ambulation                            Cognition Arousal/Alertness: Awake/alert Behavior During Therapy: WFL for tasks assessed/performed Overall Cognitive Status: Impaired/Different from baseline Area of Impairment: Problem solving  Problem Solving: Slow processing General Comments: pt with cues for RW management and intermittently increased time  for response        Exercises Other Exercises Other Exercises: sit<>stand x5 reps for BLE strengthening    General Comments General comments (skin integrity, edema, etc.): VSS on room air      Pertinent Vitals/Pain Pain Assessment Pain Assessment: No/denies pain    Home Living                          Prior Function            PT Goals (current goals can now be found in the care plan section) Acute Rehab PT Goals Patient Stated Goal: to improve strength, return to independence PT Goal Formulation: With patient Time For Goal Achievement: 07/17/22 Potential to Achieve Goals: Good Progress towards PT goals: Progressing toward goals    Frequency    Min 3X/week      PT Plan Current plan remains appropriate    Co-evaluation              AM-PAC PT "6 Clicks" Mobility   Outcome Measure  Help needed turning from your back to your side while in a flat bed without using bedrails?: A Little Help needed moving from lying on your back to sitting on the side of a flat bed without using bedrails?: A Little Help needed moving to and from a bed to a chair (including a wheelchair)?: A Little Help needed standing up from a chair using your arms (e.g., wheelchair or bedside chair)?: A Little Help needed to walk in hospital room?: A Little Help needed climbing 3-5 steps with a railing? : Total 6 Click Score: 16    End of Session   Activity Tolerance: Patient tolerated treatment well Patient left: in chair;with call bell/phone within reach;with chair alarm set;with family/visitor present Nurse Communication: Mobility status PT Visit Diagnosis: Other abnormalities of gait and mobility (R26.89);Muscle weakness (generalized) (M62.81);Repeated falls (R29.6)     Time: [id number] PT Time Calculation (min) (ACUTE ONLY): 17 min  Charges:  $Therapeutic Activity: 8-22 mins                     Charlynne Cousins, PT DPT Acute Rehabilitation Services Office  [phone]    Luvenia Heller 07/06/2022, 2:38 PM

## 2022-07-06 NOTE — TOC Progression Note (Addendum)
Transition of Care [facility]) - Progression Note    Patient Details  Name: [name] MRN: [id number] Date of Birth: [birth year]  Transition of Care [facility] CM/SW [place], LCSW Phone Number: 07/06/2022, 3:17 PM  Clinical Narrative:    CSW met with patient, spouse, and sons at bedside. They are in agreement with discharge to [facility] SNF tomorrow if stable. Insurance has approved SNF, Ref# [id number], Auth ID# [id number], effective 07/06/2022-07/10/2022. Family reported agreement to take patient by car.    Expected Discharge Plan: [place] Barriers to Discharge: Continued Medical Work up  Expected Discharge Plan and Services In-house Referral: Clinical Social Work     Living arrangements for the past 2 months: [place]                                       Social Determinants of Health (SDOH) Interventions SDOH Screenings   Food Insecurity: No Food Insecurity (06/13/2022)  Housing: Low Risk  (06/13/2022)  Transportation Needs: No Transportation Needs (06/13/2022)  Utilities: Not At Risk (06/13/2022)  Alcohol Screen: Low Risk  (05/30/2021)  Depression (PHQ2-9): Low Risk  (06/15/2022)  Financial Resource Strain: Low Risk  (06/13/2022)  Physical Activity: Inactive (06/13/2022)  Social Connections: Unknown (06/13/2022)  Stress: No Stress Concern Present (06/13/2022)  Tobacco Use: Medium Risk (07/03/2022)    Readmission Risk Interventions    07/05/2022    9:42 AM  Readmission Risk Prevention Plan  Transportation Screening Complete  Medication Review (RN Care Manager) Complete  PCP or Specialist appointment within 3-5 days of discharge Complete  HRI or [place] Complete  SW Recovery Care/Counseling Consult Complete  Palliative Care Screening Not Applicable  Skilled Nursing Facility Complete

## 2022-07-06 NOTE — Progress Notes (Signed)
PROGRESS NOTE    [name]  [id number] DOB: 1940/07/29 DOA: 07/02/2022 PCP: Cassandria Anger, MD    Chief Complaint  Patient presents with   Extremity Weakness    Brief Narrative:  [name] is a 82 y.o. female with a history of CKD stage IV, hypertension, rheumatoid arthritis, hypothyroidism, CAD s/p CABG, COPD, hyperlipidemia, DVT/PE. Patient presented secondary to weakness and fever and is found to have evidence of sepsis with a likely urinary source. Empiric antibiotics started and cultures obtained. Patient also found to have hyponatremia with associated dehydration and AKI. IV fluids started. Blood cultures significant for E. Coli bacteremia. Renal function improving with IV fluids.    Assessment & Plan:   Principal Problem:   Acute pyelonephritis Active Problems:   Acute renal failure superimposed on stage 4 chronic kidney disease (HCC)   Acute metabolic encephalopathy   Hyponatremia   Sepsis with acute renal failure (HCC)   Acquired hypothyroidism   Essential hypertension   Rheumatoid arthritis ([place])   Long term (current) use of anticoagulants   Hx of CABG   Hyperlipidemia   COPD (chronic obstructive pulmonary disease) (HCC)   CKD (chronic kidney disease) stage 4, GFR 15-29 ml/min (HCC) - baseline SCr 2.5. follows with Dr. Royce Macadamia with [facility]   History of pulmonary embolism   Metabolic acidosis, increased anion gap   Generalized weakness   DNR (do not resuscitate)/DNI(Do Not Intubate)   E coli bacteremia   Sepsis due to acute hemorrhagic cystitis /right pyelonephritis with E. coli bacteremia Sepsis present on admission - Patient with non-specific symptoms but with associated fever and leukocytosis.  She was noted to have significant right flank pain/CVA tenderness on exam . Urinalysis suggests infection, confirmed on urine culture. Patient started empirically on Ceftriaxone IV. Associated E. Coli bacteremia -on IV  Ceftriaxone 2 g IV, now  sensitive with, will transition to IV cefazoline  given available sensitivity -Follow-up blood and urine culture data   Sepsis with acute renal failure ([place]) Present on admission. Associated fever and leukocytosis with WBC of 23,800 on admission. Urinary source with associated E. Coli bacteremia.   Hyponatremia Sodium of 124 on admission. Likely related to dehydration and hypovolemia, present on admission. Patient started on IV fluids with initial improvement in sodium to 135.   Acute metabolic encephalopathy Appears to be improved today with IV fluids. Likely related to overall illness but more specifically dehydration and hyponatremia. Patient is from independent living.   Acute renal failure superimposed on stage 4 chronic kidney disease (HCC) - baseline SCr 2.5. follows with Dr. Royce Macadamia with [facility] Creatinine of 4.70 on admission secondary to poor oral intake in addition to patient continuing diuretic use. Creatinine improved to 3.69 with IV fluids -Continue IV fluids -Transition off renal diet since having continued improvement of renal function -Daily BMP    Essential hypertension Patient is managed on Coreg as an outpatient. Uncontrolled while admitted. -Pressure remains uncontrolled despite increasing Coreg . -Remains uncontrolled, discussed with husband at bedside, he reports overall her blood pressure has been acceptable to normal at home, I will add Norvasc today, and likely can be prescribed upon discharge with holding parameters.   -Add hydralazine PO PRN; may need to add second antihypertensi  Hypokalemia -Repleted  History of pulmonary embolism -Continue Eliquis   COPD (chronic obstructive pulmonary disease) (HCC) Stable. Asymptomatic.   Hyperlipidemia -Continue Lipitor daily   Hx of CABG Noted. No chest pain.   Long term (current) use of anticoagulants  Noted. Patient is on Eliquis as an outpatient for the indication of PE/DVT which does not qualify  for dose reduction, however patient is on reduced dose of 2.5 mg BID as an outpatient; possibly related to history of GI bleeding. -Resume home dose of Eliquis 2.5 mg BID   Rheumatoid arthritis ([place]) Patient is on [place] as an outpatient which is held secondary to active infection.   Acquired hypothyroidism -Continue Synthroid 88 mcg daily   Generalized weakness Secondary to acute illness. PT/OT recommending SNF. Hoping patient will improve in functional capacity prior to discharge. Recommend discontinuation of Purewick during the day. Patient encouraged to work with nursing for transfer to commode for toileting. Also recommended to have patient up out of bed or sitting up during the day. Patient motivated to not discharge to rehab.   Metabolic acidosis, increased anion gap Secondary to AKI. Expect this to improve. -If acidosis worsens, start sodium bicarbonate tablets    DVT prophylaxis: Eliquis Code Status: DNR Family Communication: D/W Husband  at bedside Disposition:   Status is: Inpatient    Consultants:  none   Subjective:  Ports she is feeling better today, complains of right flank pain,  Objective: Vitals:   07/06/22 1100 07/06/22 1147 07/06/22 1200 07/06/22 1219  BP:  (!) 158/65 (!) 162/76 (!) 169/68  Pulse: 83  84 84  Resp: '19  17 17  '$ Temp:  98.1 F (36.7 C)    TempSrc:  Oral    SpO2: 94%  96% 96%  Weight:      Height:        Intake/Output Summary (Last 24 hours) at 07/06/2022 1255 Last data filed at 07/06/2022 0806 Gross per 24 hour  Intake 660 ml  Output --  Net 660 ml   Filed Weights   07/02/22 2309 07/05/22 0550 07/06/22 0500  Weight: 63.5 kg 69.3 kg 69.3 kg    Examination:  Awake Alert, Oriented X 3, frail, deconditioned Symmetrical Chest wall movement, Good air movement bilaterally, CTAB RRR,No Gallops,Rubs or new Murmurs, No Parasternal Heave +ve B.Sounds, Abd Soft, she has right CVA tenderness today. No Cyanosis, Clubbing or edema, No new  Rash or bruise       Data Reviewed: I have personally reviewed following labs and imaging studies  CBC: Recent Labs  Lab 07/02/22 2329 07/02/22 2337 07/04/22 0709 07/05/22 0659 07/06/22 0720  WBC 23.8*  --  20.1* 13.2* 13.1*  NEUTROABS 20.1*  --  16.2*  --   --   HGB 11.8* 12.9 11.0* 10.4* 10.2*  HCT 33.0* 38.0 31.4* 31.3* 29.1*  MCV 89.2  --  89.0 90.5 89.8  PLT 198  --  204 247 0000000    Basic Metabolic Panel: Recent Labs  Lab 07/02/22 2337 07/03/22 0033 07/04/22 0709 07/05/22 0659 07/06/22 0720  NA 124* 128* 134* 135 133*  K 5.9* 4.3 3.5 3.6 3.2*  CL 91* 92* 100 99 103  CO2  --  19* 18* 19* 19*  GLUCOSE 168* 166* 78 107* 116*  BUN 118* 94* 82* 80* 70*  CREATININE 4.70* 4.09* 3.69* 3.32* 2.93*  CALCIUM  --  8.6* 8.0* 8.2* 8.4*  MG  --   --  1.8  --   --     GFR: Estimated Creatinine Clearance: 13.4 mL/min (A) (by C-G formula based on SCr of 2.93 mg/dL (H)).  Liver Function Tests: Recent Labs  Lab 07/03/22 0033 07/04/22 0709  AST 38 25  ALT 20 17  ALKPHOS 95 93  BILITOT  1.0 0.7  PROT 5.4* 5.4*  ALBUMIN 2.5* 2.2*    CBG: Recent Labs  Lab 07/03/22 0632  GLUCAP 136*     Recent Results (from the past 240 hour(s))  Resp panel by RT-PCR (RSV, Flu A&B, Covid) Peripheral     Status: None   Collection Time: 07/02/22 11:00 PM   Specimen: Peripheral; Nasal Swab  Result Value Ref Range Status   SARS Coronavirus 2 by RT PCR NEGATIVE NEGATIVE Final   Influenza A by PCR NEGATIVE NEGATIVE Final   Influenza B by PCR NEGATIVE NEGATIVE Final    Comment: (NOTE) The Xpert Xpress SARS-CoV-2/FLU/RSV plus assay is intended as an aid in the diagnosis of influenza from Nasopharyngeal swab specimens and should not be used as a sole basis for treatment. Nasal washings and aspirates are unacceptable for Xpert Xpress SARS-CoV-2/FLU/RSV testing.  Fact Sheet for Patients: EntrepreneurPulse.com.au  Fact Sheet for Healthcare  Providers: IncredibleEmployment.be  This test is not yet approved or cleared by the [place] FDA and has been authorized for detection and/or diagnosis of SARS-CoV-2 by FDA under an Emergency Use Authorization (EUA). This EUA will remain in effect (meaning this test can be used) for the duration of the COVID-19 declaration under Section 564(b)(1) of the Act, 21 U.S.C. section 360bbb-3(b)(1), unless the authorization is terminated or revoked.     Resp Syncytial Virus by PCR NEGATIVE NEGATIVE Final    Comment: (NOTE) Fact Sheet for Patients: EntrepreneurPulse.com.au  Fact Sheet for Healthcare Providers: IncredibleEmployment.be  This test is not yet approved or cleared by the [place] FDA and has been authorized for detection and/or diagnosis of SARS-CoV-2 by FDA under an Emergency Use Authorization (EUA). This EUA will remain in effect (meaning this test can be used) for the duration of the COVID-19 declaration under Section 564(b)(1) of the Act, 21 U.S.C. section 360bbb-3(b)(1), unless the authorization is terminated or revoked.  Performed at [facility], [place] [address] [place], [place] [address]   Blood Culture (routine x 2)     Status: Abnormal   Collection Time: 07/02/22 11:00 PM   Specimen: BLOOD  Result Value Ref Range Status   Specimen Description BLOOD RIGHT ARM  Final   Special Requests   Final    BOTTLES DRAWN AEROBIC AND ANAEROBIC Blood Culture adequate volume   Culture  Setup Time   Final    GRAM NEGATIVE RODS IN BOTH AEROBIC AND ANAEROBIC BOTTLES CRITICAL RESULT CALLED TO, READ BACK BY AND VERIFIED WITH: CATHY PIERCE ON 07/03/22 @ 1520 BY DRT Performed at [facility], [place] [address], [place], [place] [address]    Culture ESCHERICHIA COLI (A)  Final   Report Status 07/05/2022 FINAL  Final   Organism ID, Bacteria ESCHERICHIA COLI  Final      Susceptibility   Escherichia coli - MIC*     AMPICILLIN >=32 RESISTANT Resistant     CEFEPIME <=0.12 SENSITIVE Sensitive     CEFTAZIDIME <=1 SENSITIVE Sensitive     CEFTRIAXONE <=0.25 SENSITIVE Sensitive     CIPROFLOXACIN <=0.25 SENSITIVE Sensitive     GENTAMICIN <=1 SENSITIVE Sensitive     IMIPENEM <=0.25 SENSITIVE Sensitive     TRIMETH/SULFA <=20 SENSITIVE Sensitive     AMPICILLIN/SULBACTAM 8 SENSITIVE Sensitive     PIP/TAZO <=4 SENSITIVE Sensitive     * ESCHERICHIA COLI  Blood Culture ID Panel (Reflexed)     Status: Abnormal   Collection Time: 07/02/22 11:00 PM  Result Value Ref Range Status  Enterococcus faecalis NOT DETECTED NOT DETECTED Final   Enterococcus Faecium NOT DETECTED NOT DETECTED Final   Listeria monocytogenes NOT DETECTED NOT DETECTED Final   Staphylococcus species NOT DETECTED NOT DETECTED Final   Staphylococcus aureus (BCID) NOT DETECTED NOT DETECTED Final   Staphylococcus epidermidis NOT DETECTED NOT DETECTED Final   Staphylococcus lugdunensis NOT DETECTED NOT DETECTED Final   Streptococcus species NOT DETECTED NOT DETECTED Final   Streptococcus agalactiae NOT DETECTED NOT DETECTED Final   Streptococcus pneumoniae NOT DETECTED NOT DETECTED Final   Streptococcus pyogenes NOT DETECTED NOT DETECTED Final   A.calcoaceticus-baumannii NOT DETECTED NOT DETECTED Final   Bacteroides fragilis NOT DETECTED NOT DETECTED Final   Enterobacterales DETECTED (A) NOT DETECTED Final    Comment: Enterobacterales represent a large order of gram negative bacteria, not a single organism. CRITICAL RESULT CALLED TO, READ BACK BY AND VERIFIED WITH: CATHY PIERCE ON 07/03/22 @ 1520 BY DRT    Enterobacter cloacae complex NOT DETECTED NOT DETECTED Final   Escherichia coli DETECTED (A) NOT DETECTED Final    Comment: CRITICAL RESULT CALLED TO, READ BACK BY AND VERIFIED WITH: CATHY PIERCE ON 07/03/22 @ 1520 BY DRT    Klebsiella aerogenes NOT DETECTED NOT DETECTED Final   Klebsiella oxytoca NOT DETECTED NOT DETECTED Final   Klebsiella  pneumoniae NOT DETECTED NOT DETECTED Final   Proteus species NOT DETECTED NOT DETECTED Final   Salmonella species NOT DETECTED NOT DETECTED Final   Serratia marcescens NOT DETECTED NOT DETECTED Final   Haemophilus influenzae NOT DETECTED NOT DETECTED Final   Neisseria meningitidis NOT DETECTED NOT DETECTED Final   Pseudomonas aeruginosa NOT DETECTED NOT DETECTED Final   Stenotrophomonas maltophilia NOT DETECTED NOT DETECTED Final   Candida albicans NOT DETECTED NOT DETECTED Final   Candida auris NOT DETECTED NOT DETECTED Final   Candida glabrata NOT DETECTED NOT DETECTED Final   Candida krusei NOT DETECTED NOT DETECTED Final   Candida parapsilosis NOT DETECTED NOT DETECTED Final   Candida tropicalis NOT DETECTED NOT DETECTED Final   Cryptococcus neoformans/gattii NOT DETECTED NOT DETECTED Final   CTX-M ESBL NOT DETECTED NOT DETECTED Final   Carbapenem resistance IMP NOT DETECTED NOT DETECTED Final   Carbapenem resistance KPC NOT DETECTED NOT DETECTED Final   Carbapenem resistance NDM NOT DETECTED NOT DETECTED Final   Carbapenem resist OXA 48 LIKE NOT DETECTED NOT DETECTED Final   Carbapenem resistance VIM NOT DETECTED NOT DETECTED Final    Comment: Performed at [facility], [place] [address], [place], [place] [address]  Urine Culture (for pregnant, neutropenic or urologic patients or patients with an indwelling urinary catheter)     Status: Abnormal   Collection Time: 07/02/22 11:29 PM   Specimen: Urine, Catheterized  Result Value Ref Range Status   Specimen Description URINE, CATHETERIZED  Final   Special Requests   Final    NONE Performed at [facility], [place] [address], [place], [place] [address]    Culture (A)  Final    >=100,000 COLONIES/mL ESCHERICHIA COLI 80,000 COLONIES/mL KLEBSIELLA PNEUMONIAE    Report Status 07/05/2022 FINAL  Final   Organism ID, Bacteria ESCHERICHIA COLI (A)  Final   Organism ID, Bacteria KLEBSIELLA PNEUMONIAE (A)  Final      Susceptibility    Escherichia coli - MIC*    AMPICILLIN >=32 RESISTANT Resistant     CEFAZOLIN <=4 SENSITIVE Sensitive     CEFEPIME <=0.12 SENSITIVE Sensitive     CEFTRIAXONE <=0.25 SENSITIVE Sensitive  CIPROFLOXACIN <=0.25 SENSITIVE Sensitive     GENTAMICIN <=1 SENSITIVE Sensitive     IMIPENEM <=0.25 SENSITIVE Sensitive     NITROFURANTOIN <=16 SENSITIVE Sensitive     TRIMETH/SULFA <=20 SENSITIVE Sensitive     AMPICILLIN/SULBACTAM 4 SENSITIVE Sensitive     PIP/TAZO <=4 SENSITIVE Sensitive     * >=100,000 COLONIES/mL ESCHERICHIA COLI   Klebsiella pneumoniae - MIC*    AMPICILLIN RESISTANT Resistant     CEFAZOLIN <=4 SENSITIVE Sensitive     CEFEPIME <=0.12 SENSITIVE Sensitive     CEFTRIAXONE <=0.25 SENSITIVE Sensitive     CIPROFLOXACIN <=0.25 SENSITIVE Sensitive     GENTAMICIN <=1 SENSITIVE Sensitive     IMIPENEM <=0.25 SENSITIVE Sensitive     NITROFURANTOIN 32 SENSITIVE Sensitive     TRIMETH/SULFA <=20 SENSITIVE Sensitive     AMPICILLIN/SULBACTAM <=2 SENSITIVE Sensitive     PIP/TAZO <=4 SENSITIVE Sensitive     * 80,000 COLONIES/mL KLEBSIELLA PNEUMONIAE  Blood Culture (routine x 2)     Status: Abnormal   Collection Time: 07/03/22  2:20 AM   Specimen: BLOOD  Result Value Ref Range Status   Specimen Description BLOOD LEFT ARM  Final   Special Requests   Final    BOTTLES DRAWN AEROBIC AND ANAEROBIC Blood Culture adequate volume   Culture  Setup Time   Final    GRAM NEGATIVE RODS IN BOTH AEROBIC AND ANAEROBIC BOTTLES CRITICAL VALUE NOTED.  VALUE IS CONSISTENT WITH PREVIOUSLY REPORTED AND CALLED VALUE.    Culture (A)  Final    ESCHERICHIA COLI SUSCEPTIBILITIES PERFORMED ON PREVIOUS CULTURE WITHIN THE LAST 5 DAYS. Performed at [facility], [place] [address] [place], [place] [address]    Report Status 07/05/2022 FINAL  Final         Radiology Studies: No results found.      Scheduled Meds:  amLODipine  5 mg Oral Daily   apixaban  2.5 mg Oral BID   atorvastatin  80 mg Oral  Daily   carvedilol  6.25 mg Oral BID WC   levothyroxine  88 mcg Oral QAC breakfast   triamcinolone  1 spray Each Nare Daily   Continuous Infusions:  sodium chloride 75 mL/hr at 07/06/22 0517    ceFAZolin (ANCEF) IV 2 g (07/06/22 0806)     LOS: 3 days      Phillips Climes, MD [facility]   To contact the attending provider between 7A-7P or the covering provider during after hours 7P-7A, please log into the web site [email or web address] and access using universal [place] password for that web site. If you do not have the password, please call the hospital operator.  07/06/2022, 12:55 PM

## 2022-07-06 NOTE — Progress Notes (Signed)
Occupational Therapy Treatment Patient Details Name: [name] MRN: [id number] DOB: 04/29/1941 Today's Date: 07/06/2022   History of present illness 82 y.o. female [facility] 07/02/2022 with worsening weakness. Pt admitted for management of acute cystitis. PMH includes CKD IV, HTN, RA, hypothyroidism, CABG, COPD, HLD, PE.   OT comments  Patient demonstrating good gains with supervision for grooming standing at sink and toilet transfers. Patient able to locate all items at sink during grooming. Patient performed mobility in hallway and able to identify items on right and left during mobility. Patient would benefit from continued OT treatment to address bathing, dressing, grooming, and vision with HHOT to increase safety at home. Acute OT to continue to follow.    Recommendations for follow up therapy are one component of a multi-disciplinary discharge planning process, led by the attending physician.  Recommendations may be updated based on patient status, additional functional criteria and insurance authorization.    Follow Up Recommendations  Home health OT     Assistance Recommended at Discharge Intermittent Supervision/Assistance  Patient can return home with the following  A little help with walking and/or transfers;A little help with bathing/dressing/bathroom;Assistance with cooking/housework;Assist for transportation;Help with stairs or ramp for entrance   Equipment Recommendations  None recommended by OT    Recommendations for Other Services      Precautions / Restrictions Precautions Precautions: Fall Restrictions Weight Bearing Restrictions: No       Mobility Bed Mobility Overal bed mobility: Needs Assistance             General bed mobility comments: OOB in recliner    Transfers Overall transfer level: Needs assistance Equipment used: Rolling walker (2 wheels) Transfers: Sit to/from Stand Sit to Stand: Min guard, Min assist            General transfer comment: min guard from recliner to power up and min assist from toilet     Balance Overall balance assessment: Needs assistance Sitting-balance support: Feet supported, No upper extremity supported Sitting balance-Leahy Scale: Fair     Standing balance support: Single extremity supported, Bilateral upper extremity supported, During functional activity Standing balance-Leahy Scale: Poor Standing balance comment: able to perform grooming tasks standing at sink with one extremity support                           ADL either performed or assessed with clinical judgement   ADL Overall ADL's : Needs assistance/impaired     Grooming: Wash/dry hands;Wash/dry face;Oral care;Supervision/safety;Standing Grooming Details (indicate cue type and reason): at sink         Upper Body Dressing : Minimal assistance;Standing Upper Body Dressing Details (indicate cue type and reason): due to IV     Toilet Transfer: Min guard;Ambulation;Rolling walker (2 wheels);Regular Toilet;Grab bars Toilet Transfer Details (indicate cue type and reason): in restroom in room Toileting- Clothing Manipulation and Hygiene: Moderate assistance;Sit to/from stand Toileting - Clothing Manipulation Details (indicate cue type and reason): able to manage clothing with mod assist to complete toilet hygiene            Extremity/Trunk Assessment              Vision       Perception     Praxis      Cognition Arousal/Alertness: Awake/alert Behavior During Therapy: WFL for tasks assessed/performed Overall Cognitive Status: Impaired/Different from baseline Area of Impairment: Problem solving  Problem Solving: Slow processing General Comments: cues for safe walker use and general safety        Exercises      Shoulder Instructions       General Comments VSS on RA    Pertinent Vitals/ Pain       Pain Assessment Pain Assessment:  No/denies pain  Home Living                                          Prior Functioning/Environment              Frequency  Min 2X/week        Progress Toward Goals  OT Goals(current goals can now be found in the care plan section)  Progress towards OT goals: Progressing toward goals  Acute Rehab OT Goals Patient Stated Goal: go home OT Goal Formulation: With patient Time For Goal Achievement: 07/18/22 Potential to Achieve Goals: Good ADL Goals Pt Will Perform Grooming: with modified independence;standing Pt Will Perform Upper Body Dressing: with modified independence;sitting Pt Will Perform Lower Body Dressing: with modified independence;sit to/from stand Pt Will Transfer to Toilet: with modified independence;ambulating;regular height toilet Additional ADL Goal #1: Pt will verbalize use of visual occlusion glasses with taping over R eye when experiencing double vision. Additional ADL Goal #2: Pt will perform VOR exercises to optimize visual tracking horizintally without jumping movements in her eyes.  Plan Discharge plan remains appropriate    Co-evaluation                 AM-PAC OT "6 Clicks" Daily Activity     Outcome Measure   Help from another person eating meals?: None Help from another person taking care of personal grooming?: A Little Help from another person toileting, which includes using toliet, bedpan, or urinal?: A Little Help from another person bathing (including washing, rinsing, drying)?: A Little Help from another person to put on and taking off regular upper body clothing?: A Little Help from another person to put on and taking off regular lower body clothing?: A Little 6 Click Score: 19    End of Session Equipment Utilized During Treatment: Gait belt;Rolling walker (2 wheels)  OT Visit Diagnosis: Unsteadiness on feet (R26.81);Muscle weakness (generalized) (M62.81);Other abnormalities of gait and mobility (R26.89)    Activity Tolerance Patient tolerated treatment well   Patient Left in chair;with call bell/phone within reach;with chair alarm set;with family/visitor present   Nurse Communication Mobility status        Time: [id number] OT Time Calculation (min): 36 min  Charges: OT General Charges $OT Visit: 1 Visit OT Treatments $Self Care/Home Management : 8-22 mins $Therapeutic Activity: 8-22 mins  Lodema Hong, [place]  Office [phone]   Trixie Dredge 07/06/2022, 10:48 AM

## 2022-07-07 ENCOUNTER — Ambulatory Visit: Payer: Medicare PPO | Admitting: Internal Medicine

## 2022-07-07 DIAGNOSIS — R195 Other fecal abnormalities: Secondary | ICD-10-CM

## 2022-07-07 DIAGNOSIS — N10 Acute tubulo-interstitial nephritis: Secondary | ICD-10-CM | POA: Diagnosis not present

## 2022-07-07 DIAGNOSIS — N179 Acute kidney failure, unspecified: Secondary | ICD-10-CM | POA: Diagnosis not present

## 2022-07-07 DIAGNOSIS — Z7901 Long term (current) use of anticoagulants: Secondary | ICD-10-CM | POA: Diagnosis not present

## 2022-07-07 DIAGNOSIS — I10 Essential (primary) hypertension: Secondary | ICD-10-CM | POA: Diagnosis not present

## 2022-07-07 DIAGNOSIS — K921 Melena: Secondary | ICD-10-CM | POA: Diagnosis not present

## 2022-07-07 DIAGNOSIS — D649 Anemia, unspecified: Secondary | ICD-10-CM

## 2022-07-07 LAB — CBC
HCT: 30.4 % — ABNORMAL LOW (ref 36.0–46.0)
HCT: 30.6 % — ABNORMAL LOW (ref 36.0–46.0)
HCT: 25.4 % — ABNORMAL LOW (ref 36.0–46.0)
Hemoglobin: 10.2 g/dL — ABNORMAL LOW (ref 12.0–15.0)
Hemoglobin: 10.2 g/dL — ABNORMAL LOW (ref 12.0–15.0)
Hemoglobin: 8.7 g/dL — ABNORMAL LOW (ref 12.0–15.0)
MCH: 30.7 pg (ref 26.0–34.0)
MCH: 30.6 pg (ref 26.0–34.0)
MCH: 31.2 pg (ref 26.0–34.0)
MCHC: 33.6 g/dL (ref 30.0–36.0)
MCHC: 33.3 g/dL (ref 30.0–36.0)
MCHC: 34.3 g/dL (ref 30.0–36.0)
MCV: 91.6 fL (ref 80.0–100.0)
MCV: 91.9 fL (ref 80.0–100.0)
MCV: 91 fL (ref 80.0–100.0)
Platelets: 283 10*3/uL (ref 150–400)
Platelets: 280 10*3/uL (ref 150–400)
Platelets: 269 10*3/uL (ref 150–400)
RBC: 3.32 MIL/uL — ABNORMAL LOW (ref 3.87–5.11)
RBC: 3.33 MIL/uL — ABNORMAL LOW (ref 3.87–5.11)
RBC: 2.79 MIL/uL — ABNORMAL LOW (ref 3.87–5.11)
RDW: 17.9 % — ABNORMAL HIGH (ref 11.5–15.5)
RDW: 18.1 % — ABNORMAL HIGH (ref 11.5–15.5)
RDW: 18.1 % — ABNORMAL HIGH (ref 11.5–15.5)
WBC: 16 10*3/uL — ABNORMAL HIGH (ref 4.0–10.5)
WBC: 13 10*3/uL — ABNORMAL HIGH (ref 4.0–10.5)
WBC: 14.9 10*3/uL — ABNORMAL HIGH (ref 4.0–10.5)
nRBC: 0.5 % — ABNORMAL HIGH (ref 0.0–0.2)
nRBC: 0.8 % — ABNORMAL HIGH (ref 0.0–0.2)
nRBC: 0.7 % — ABNORMAL HIGH (ref 0.0–0.2)

## 2022-07-07 LAB — BASIC METABOLIC PANEL
Anion gap: 12 (ref 5–15)
BUN: 65 mg/dL — ABNORMAL HIGH (ref 8–23)
CO2: 18 mmol/L — ABNORMAL LOW (ref 22–32)
Calcium: 9.3 mg/dL (ref 8.9–10.3)
Chloride: 107 mmol/L (ref 98–111)
Creatinine, Ser: 2.89 mg/dL — ABNORMAL HIGH (ref 0.44–1.00)
GFR, Estimated: 16 mL/min — ABNORMAL LOW (ref >60)
Glucose, Bld: 120 mg/dL — ABNORMAL HIGH (ref 70–99)
Potassium: 4.3 mmol/L (ref 3.5–5.1)
Sodium: 137 mmol/L (ref 135–145)

## 2022-07-07 MED ORDER — MAGNESIUM CITRATE PO SOLN
1.0000 | Freq: Once | ORAL | Status: AC
  Administered 2022-07-07: 1 via ORAL
  Filled 2022-07-07: qty 296

## 2022-07-07 MED ORDER — SODIUM CHLORIDE 0.9 % IV SOLN: INTRAVENOUS | Status: DC

## 2022-07-07 MED ORDER — PANTOPRAZOLE SODIUM 40 MG IV SOLR
40.0000 mg | Freq: Two times a day (BID) | INTRAVENOUS | Status: DC
  Administered 2022-07-07 – 2022-07-11 (×10): 40 mg via INTRAVENOUS
  Filled 2022-07-07 (×11): qty 10

## 2022-07-07 MED ORDER — AMLODIPINE BESYLATE 10 MG PO TABS
10.0000 mg | ORAL_TABLET | Freq: Every day | ORAL | Status: DC
  Administered 2022-07-07 – 2022-07-20 (×14): 10 mg via ORAL
  Filled 2022-07-07 (×14): qty 1

## 2022-07-07 MED ORDER — HYDRALAZINE HCL 50 MG PO TABS
50.0000 mg | ORAL_TABLET | Freq: Three times a day (TID) | ORAL | Status: DC
  Administered 2022-07-07: 50 mg via ORAL
  Filled 2022-07-07: qty 1

## 2022-07-07 NOTE — Progress Notes (Signed)
Mobility Specialist Progress Note   07/07/22 1615  Mobility  Activity Ambulated with assistance in hallway  Level of Assistance Contact guard assist, steadying assist  Assistive Device Front wheel walker  Distance Ambulated (ft) 272 ft  Activity Response Tolerated well  Mobility Referral Yes  $Mobility charge 1 Mobility   Received in chair sleepy an hard to wake up but once alaart agreeable. Ambulated w/o fault or complaint. Returned back to chair w/ all needs met and call bell in reach.  Holland Falling Mobility Specialist Please contact via SecureChat or  Rehab office at [phone]

## 2022-07-07 NOTE — Progress Notes (Signed)
PROGRESS NOTE    [name]  [id number] DOB: 17-Apr-1941 DOA: 07/02/2022 PCP: Cassandria Anger, MD    Chief Complaint  Patient presents with   Extremity Weakness    Brief Narrative:  [name] is a 82 y.o. female with a history of CKD stage IV, hypertension, rheumatoid arthritis, hypothyroidism, CAD s/p CABG, COPD, hyperlipidemia, DVT/PE. Patient presented secondary to weakness and fever and is found to have evidence of sepsis with a likely urinary source. Empiric antibiotics started and cultures obtained. Patient also found to have hyponatremia with associated dehydration and AKI. IV fluids started. Blood cultures significant for E. Coli bacteremia. Renal function improving with IV fluids.    Assessment & Plan:   Principal Problem:   Acute pyelonephritis Active Problems:   Acute renal failure superimposed on stage 4 chronic kidney disease (HCC)   Acute metabolic encephalopathy   Hyponatremia   Sepsis with acute renal failure (HCC)   Acquired hypothyroidism   Essential hypertension   Rheumatoid arthritis ([place])   Long term (current) use of anticoagulants   Hx of CABG   Hyperlipidemia   COPD (chronic obstructive pulmonary disease) (HCC)   CKD (chronic kidney disease) stage 4, GFR 15-29 ml/min (HCC) - baseline SCr 2.5. follows with Dr. Royce Macadamia with [facility]   History of pulmonary embolism   Metabolic acidosis, increased anion gap   Generalized weakness   DNR (do not resuscitate)/DNI(Do Not Intubate)   E coli bacteremia   Sepsis due to acute hemorrhagic cystitis /right pyelonephritis with E. coli bacteremia Sepsis present on admission - Patient with non-specific symptoms but with associated fever and leukocytosis.  She was noted to have significant right flank pain/CVA tenderness on exam . Urinalysis suggests infection, confirmed on urine culture. Patient started empirically on Ceftriaxone IV. Associated E. Coli bacteremia -on IV  Ceftriaxone 2 g IV, now  sensitive with, will transition to IV cefazoline  given available sensitivity -Follow-up blood and urine culture data   Sepsis with acute renal failure ([place]) Present on admission. Associated fever and leukocytosis with WBC of 23,800 on admission. Urinary source with associated E. Coli bacteremia.   Hyponatremia Sodium of 124 on admission. Likely related to dehydration and hypovolemia, present on admission. Patient started on IV fluids with initial improvement in sodium to 135.   Acute metabolic encephalopathy Appears to be improved today with IV fluids. Likely related to overall illness but more specifically dehydration and hyponatremia. Patient is from independent living.   Acute renal failure superimposed on stage 4 chronic kidney disease (HCC) - baseline SCr 2.5. follows with Dr. Royce Macadamia with [facility] Creatinine of 4.70 on admission secondary to poor oral intake in addition to patient continuing diuretic use. Creatinine improved to 3.69 with IV fluids -Continue IV fluids -Transition off renal diet since having continued improvement of renal function -Daily BMP  Melena -Patient noted to have melena this morning, she was Hemoccult positive, holding Eliquis and consulting GI, will keep on Protonix 40 mg IV twice daily    Essential hypertension Patient is managed on Coreg as an outpatient. Uncontrolled while admitted. -Pressure remains uncontrolled despite increasing Coreg . -Remains uncontrolled, discussed with husband at bedside, he reports overall her blood pressure has been acceptable to normal at home. -Blood pressure remains significantly elevated despite adding Norvasc, did add scheduled hydralazine as well. -Continue with as needed hydralazine  Hypokalemia -Repleted  History of pulmonary embolism -Will hold Eliquis today given she developed melena.   COPD (chronic obstructive pulmonary disease) ([place])  Stable. Asymptomatic.   Hyperlipidemia -Continue Lipitor daily    Hx of CABG Noted. No chest pain.   Long term (current) use of anticoagulants Noted. Patient is on Eliquis as an outpatient for the indication of PE/DVT which does not qualify for dose reduction, however patient is on reduced dose of 2.5 mg BID as an outpatient; possibly related to history of GI bleeding.   Rheumatoid arthritis ([place]) Patient is on [place] as an outpatient which is held secondary to active infection.   Acquired hypothyroidism -Continue Synthroid 88 mcg daily   Generalized weakness Secondary to acute illness. PT/OT recommending SNF. Hoping patient will improve in functional capacity prior to discharge. Recommend discontinuation of Purewick during the day. Patient encouraged to work with nursing for transfer to commode for toileting. Also recommended to have patient up out of bed or sitting up during the day. Patient motivated to not discharge to rehab.   Metabolic acidosis, increased anion gap Secondary to AKI. Expect this to improve. -If acidosis worsens, start sodium bicarbonate tablets    DVT prophylaxis: Eliquis Code Status: DNR Family Communication: D/W Husband  at bedside Disposition:   Status is: Inpatient    Consultants:  none   Subjective:  Ports she is feeling better today, complains of right flank pain,  Objective: Vitals:   07/07/22 1128 07/07/22 1129 07/07/22 1130 07/07/22 1131  BP:      Pulse: 89 89 88 91  Resp: (!) 27 (!) 25 17 (!) 21  Temp:      TempSrc:      SpO2: 97% 99% 97% 97%  Weight:      Height:        Intake/Output Summary (Last 24 hours) at 07/07/2022 1202 Last data filed at 07/07/2022 0800 Gross per 24 hour  Intake 6202.09 ml  Output --  Net 6202.09 ml   Filed Weights   07/05/22 0550 07/06/22 0500 07/07/22 0349  Weight: 69.3 kg 69.3 kg 69.3 kg    Examination:  Awake Alert, Oriented X 3, frail, deconditioned Symmetrical Chest wall movement, Good air movement bilaterally, CTAB RRR,No Gallops,Rubs or new Murmurs, No  Parasternal Heave +ve B.Sounds, Abd Soft, right CVA tenderness almost resolved No Cyanosis, Clubbing or edema, No new Rash or bruise        Data Reviewed: I have personally reviewed following labs and imaging studies  CBC: Recent Labs  Lab 07/02/22 2329 07/02/22 2337 07/04/22 0709 07/05/22 0659 07/06/22 0720 07/07/22 0802  WBC 23.8*  --  20.1* 13.2* 13.1* 16.0*  NEUTROABS 20.1*  --  16.2*  --   --   --   HGB 11.8* 12.9 11.0* 10.4* 10.2* 10.2*  HCT 33.0* 38.0 31.4* 31.3* 29.1* 30.4*  MCV 89.2  --  89.0 90.5 89.8 91.6  PLT 198  --  204 247 246 [id number]    Basic Metabolic Panel: Recent Labs  Lab 07/03/22 0033 07/04/22 0709 07/05/22 0659 07/06/22 0720 07/07/22 0802  NA 128* 134* 135 133* 137  K 4.3 3.5 3.6 3.2* 4.3  CL 92* 100 99 103 107  CO2 19* 18* 19* 19* 18*  GLUCOSE 166* 78 107* 116* 120*  BUN 94* 82* 80* 70* 65*  CREATININE 4.09* 3.69* 3.32* 2.93* 2.89*  CALCIUM 8.6* 8.0* 8.2* 8.4* 9.3  MG  --  1.8  --   --   --     GFR: Estimated Creatinine Clearance: 13.5 mL/min (A) (by C-G formula based on SCr of 2.89 mg/dL (H)).  Liver Function Tests: Recent Labs  Lab 07/03/22 0033 07/04/22 0709  AST 38 25  ALT 20 17  ALKPHOS 95 93  BILITOT 1.0 0.7  PROT 5.4* 5.4*  ALBUMIN 2.5* 2.2*    CBG: Recent Labs  Lab 07/03/22 0632  GLUCAP 136*     Recent Results (from the past 240 hour(s))  Resp panel by RT-PCR (RSV, Flu A&B, Covid) Peripheral     Status: None   Collection Time: 07/02/22 11:00 PM   Specimen: Peripheral; Nasal Swab  Result Value Ref Range Status   SARS Coronavirus 2 by RT PCR NEGATIVE NEGATIVE Final   Influenza A by PCR NEGATIVE NEGATIVE Final   Influenza B by PCR NEGATIVE NEGATIVE Final    Comment: (NOTE) The Xpert Xpress SARS-CoV-2/FLU/RSV plus assay is intended as an aid in the diagnosis of influenza from Nasopharyngeal swab specimens and should not be used as a sole basis for treatment. Nasal washings and aspirates are unacceptable for Xpert  Xpress SARS-CoV-2/FLU/RSV testing.  Fact Sheet for Patients: EntrepreneurPulse.com.au  Fact Sheet for Healthcare Providers: IncredibleEmployment.be  This test is not yet approved or cleared by the [place] FDA and has been authorized for detection and/or diagnosis of SARS-CoV-2 by FDA under an Emergency Use Authorization (EUA). This EUA will remain in effect (meaning this test can be used) for the duration of the COVID-19 declaration under Section 564(b)(1) of the Act, 21 U.S.C. section 360bbb-3(b)(1), unless the authorization is terminated or revoked.     Resp Syncytial Virus by PCR NEGATIVE NEGATIVE Final    Comment: (NOTE) Fact Sheet for Patients: EntrepreneurPulse.com.au  Fact Sheet for Healthcare Providers: IncredibleEmployment.be  This test is not yet approved or cleared by the [place] FDA and has been authorized for detection and/or diagnosis of SARS-CoV-2 by FDA under an Emergency Use Authorization (EUA). This EUA will remain in effect (meaning this test can be used) for the duration of the COVID-19 declaration under Section 564(b)(1) of the Act, 21 U.S.C. section 360bbb-3(b)(1), unless the authorization is terminated or revoked.  Performed at [facility], [place] [address] [place], [place] [address]   Blood Culture (routine x 2)     Status: Abnormal   Collection Time: 07/02/22 11:00 PM   Specimen: BLOOD  Result Value Ref Range Status   Specimen Description BLOOD RIGHT ARM  Final   Special Requests   Final    BOTTLES DRAWN AEROBIC AND ANAEROBIC Blood Culture adequate volume   Culture  Setup Time   Final    GRAM NEGATIVE RODS IN BOTH AEROBIC AND ANAEROBIC BOTTLES CRITICAL RESULT CALLED TO, READ BACK BY AND VERIFIED WITH: CATHY PIERCE ON 07/03/22 @ 1520 BY DRT Performed at [facility], [place] [address] [place], [place] [address]    Culture ESCHERICHIA COLI (A)  Final    Report Status 07/05/2022 FINAL  Final   Organism ID, Bacteria ESCHERICHIA COLI  Final      Susceptibility   Escherichia coli - MIC*    AMPICILLIN >=32 RESISTANT Resistant     CEFEPIME <=0.12 SENSITIVE Sensitive     CEFTAZIDIME <=1 SENSITIVE Sensitive     CEFTRIAXONE <=0.25 SENSITIVE Sensitive     CIPROFLOXACIN <=0.25 SENSITIVE Sensitive     GENTAMICIN <=1 SENSITIVE Sensitive     IMIPENEM <=0.25 SENSITIVE Sensitive     TRIMETH/SULFA <=20 SENSITIVE Sensitive     AMPICILLIN/SULBACTAM 8 SENSITIVE Sensitive     PIP/TAZO <=4 SENSITIVE Sensitive     * ESCHERICHIA COLI  Blood Culture ID Panel (Reflexed)  Status: Abnormal   Collection Time: 07/02/22 11:00 PM  Result Value Ref Range Status   Enterococcus faecalis NOT DETECTED NOT DETECTED Final   Enterococcus Faecium NOT DETECTED NOT DETECTED Final   Listeria monocytogenes NOT DETECTED NOT DETECTED Final   Staphylococcus species NOT DETECTED NOT DETECTED Final   Staphylococcus aureus (BCID) NOT DETECTED NOT DETECTED Final   Staphylococcus epidermidis NOT DETECTED NOT DETECTED Final   Staphylococcus lugdunensis NOT DETECTED NOT DETECTED Final   Streptococcus species NOT DETECTED NOT DETECTED Final   Streptococcus agalactiae NOT DETECTED NOT DETECTED Final   Streptococcus pneumoniae NOT DETECTED NOT DETECTED Final   Streptococcus pyogenes NOT DETECTED NOT DETECTED Final   A.calcoaceticus-baumannii NOT DETECTED NOT DETECTED Final   Bacteroides fragilis NOT DETECTED NOT DETECTED Final   Enterobacterales DETECTED (A) NOT DETECTED Final    Comment: Enterobacterales represent a large order of gram negative bacteria, not a single organism. CRITICAL RESULT CALLED TO, READ BACK BY AND VERIFIED WITH: CATHY PIERCE ON 07/03/22 @ 1520 BY DRT    Enterobacter cloacae complex NOT DETECTED NOT DETECTED Final   Escherichia coli DETECTED (A) NOT DETECTED Final    Comment: CRITICAL RESULT CALLED TO, READ BACK BY AND VERIFIED WITH: CATHY PIERCE ON 07/03/22 @  1520 BY DRT    Klebsiella aerogenes NOT DETECTED NOT DETECTED Final   Klebsiella oxytoca NOT DETECTED NOT DETECTED Final   Klebsiella pneumoniae NOT DETECTED NOT DETECTED Final   Proteus species NOT DETECTED NOT DETECTED Final   Salmonella species NOT DETECTED NOT DETECTED Final   Serratia marcescens NOT DETECTED NOT DETECTED Final   Haemophilus influenzae NOT DETECTED NOT DETECTED Final   Neisseria meningitidis NOT DETECTED NOT DETECTED Final   Pseudomonas aeruginosa NOT DETECTED NOT DETECTED Final   Stenotrophomonas maltophilia NOT DETECTED NOT DETECTED Final   Candida albicans NOT DETECTED NOT DETECTED Final   Candida auris NOT DETECTED NOT DETECTED Final   Candida glabrata NOT DETECTED NOT DETECTED Final   Candida krusei NOT DETECTED NOT DETECTED Final   Candida parapsilosis NOT DETECTED NOT DETECTED Final   Candida tropicalis NOT DETECTED NOT DETECTED Final   Cryptococcus neoformans/gattii NOT DETECTED NOT DETECTED Final   CTX-M ESBL NOT DETECTED NOT DETECTED Final   Carbapenem resistance IMP NOT DETECTED NOT DETECTED Final   Carbapenem resistance KPC NOT DETECTED NOT DETECTED Final   Carbapenem resistance NDM NOT DETECTED NOT DETECTED Final   Carbapenem resist OXA 48 LIKE NOT DETECTED NOT DETECTED Final   Carbapenem resistance VIM NOT DETECTED NOT DETECTED Final    Comment: Performed at [facility], [place] [address] [place], [place] [address]  Urine Culture (for pregnant, neutropenic or urologic patients or patients with an indwelling urinary catheter)     Status: Abnormal   Collection Time: 07/02/22 11:29 PM   Specimen: Urine, Catheterized  Result Value Ref Range Status   Specimen Description URINE, CATHETERIZED  Final   Special Requests   Final    NONE Performed at [facility], [place] [address], [place], [place] [address]    Culture (A)  Final    >=100,000 COLONIES/mL ESCHERICHIA COLI 80,000 COLONIES/mL KLEBSIELLA PNEUMONIAE    Report Status 07/05/2022 FINAL   Final   Organism ID, Bacteria ESCHERICHIA COLI (A)  Final   Organism ID, Bacteria KLEBSIELLA PNEUMONIAE (A)  Final      Susceptibility   Escherichia coli - MIC*    AMPICILLIN >=32 RESISTANT Resistant     CEFAZOLIN <=4 SENSITIVE Sensitive  CEFEPIME <=0.12 SENSITIVE Sensitive     CEFTRIAXONE <=0.25 SENSITIVE Sensitive     CIPROFLOXACIN <=0.25 SENSITIVE Sensitive     GENTAMICIN <=1 SENSITIVE Sensitive     IMIPENEM <=0.25 SENSITIVE Sensitive     NITROFURANTOIN <=16 SENSITIVE Sensitive     TRIMETH/SULFA <=20 SENSITIVE Sensitive     AMPICILLIN/SULBACTAM 4 SENSITIVE Sensitive     PIP/TAZO <=4 SENSITIVE Sensitive     * >=100,000 COLONIES/mL ESCHERICHIA COLI   Klebsiella pneumoniae - MIC*    AMPICILLIN RESISTANT Resistant     CEFAZOLIN <=4 SENSITIVE Sensitive     CEFEPIME <=0.12 SENSITIVE Sensitive     CEFTRIAXONE <=0.25 SENSITIVE Sensitive     CIPROFLOXACIN <=0.25 SENSITIVE Sensitive     GENTAMICIN <=1 SENSITIVE Sensitive     IMIPENEM <=0.25 SENSITIVE Sensitive     NITROFURANTOIN 32 SENSITIVE Sensitive     TRIMETH/SULFA <=20 SENSITIVE Sensitive     AMPICILLIN/SULBACTAM <=2 SENSITIVE Sensitive     PIP/TAZO <=4 SENSITIVE Sensitive     * 80,000 COLONIES/mL KLEBSIELLA PNEUMONIAE  Blood Culture (routine x 2)     Status: Abnormal   Collection Time: 07/03/22  2:20 AM   Specimen: BLOOD  Result Value Ref Range Status   Specimen Description BLOOD LEFT ARM  Final   Special Requests   Final    BOTTLES DRAWN AEROBIC AND ANAEROBIC Blood Culture adequate volume   Culture  Setup Time   Final    GRAM NEGATIVE RODS IN BOTH AEROBIC AND ANAEROBIC BOTTLES CRITICAL VALUE NOTED.  VALUE IS CONSISTENT WITH PREVIOUSLY REPORTED AND CALLED VALUE.    Culture (A)  Final    ESCHERICHIA COLI SUSCEPTIBILITIES PERFORMED ON PREVIOUS CULTURE WITHIN THE LAST 5 DAYS. Performed at [facility], [place] [address] [place], [place] [address]    Report Status 07/05/2022 FINAL  Final         Radiology  Studies: No results found.      Scheduled Meds:  amLODipine  10 mg Oral Daily   atorvastatin  80 mg Oral Daily   carvedilol  6.25 mg Oral BID WC   levothyroxine  88 mcg Oral QAC breakfast   magnesium citrate  1 Bottle Oral Once   triamcinolone  1 spray Each Nare Daily   Continuous Infusions:  sodium chloride 75 mL/hr at 07/06/22 0517    ceFAZolin (ANCEF) IV 2 g (07/07/22 0932)     LOS: 4 days      Phillips Climes, MD [facility]   To contact the attending provider between 7A-7P or the covering provider during after hours 7P-7A, please log into the web site [email or web address] and access using universal [place] password for that web site. If you do not have the password, please call the hospital operator.  07/07/2022, 12:02 PM

## 2022-07-07 NOTE — H&P (View-Only) (Signed)
Consultation Note   Referring Provider:  [facility] PCP: Cassandria Anger, MD Primary Gastroenterologist: Anawalt Cellar, MD Reason for consultation:   [place] [facility] Day: 6  ASSESSMENT  #82 yo female admitted with sepsis / acute metabolic encephalopathy/ cystitis / E.coli bacteremia /  AKI on CKD Renal function improving. Cr 4.7 >>2.8. Baseline Cr ~ 2.5.  On Ceftriaxone  # History of PE, on Eliquis  # Melena on Eliquis / anemia. Large black, loose BM this am. FOBT+.  Baseline hgb ~ 12.Hgb, down to 10.2 this admission after IVF.    # PE , on Eliquis Last dose of Eliquis was 9pm last night  # Hx of adenomatous colon polyps.  Given her age and comorbidities she is no longer undergoing surveillance exams.    # History of appendectomy for large precancerous polyp invading the appendix in 2017.     PLAN -Continue BID IV PPI -CBC now then Q 8 hours. -Eliquis is on hold -Reduce diet to clear liquids, NPO after MN for possible EGD tomorrow. -The risks and benefits of EGD with possible biopsies were discussed with the patient who agrees to proceed.    HPI:  Patient is a 82 y.o. 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    See PMH for any additional medical problems.  Patient admitted 3/4. with acute metabolic encephalopathy, hyponatremia, AKI on CKD. She is being treated for sepsis / acute pyelonephritis. Mentation okay today. Patient had a large, loose black BM early this am. Nursing staff told her it was black. Hemoccult ordered and positive. No NSAID use. She has no focal GI symptoms such as abdominal pain, N/V.    Previous GI History / Evaluation :   Most recent colonoscopy in 2017 - A 5 mm polyp was found in the  cecum. The polyp was sessile. The polyp was removed with a cold snare.  - A medium polyp was found in the appendiceal orifice, the borders could not be seen and appeared to be invading the appendix. The polyp was sessile. Biopsies were taken with a cold forceps for histology. - A 8 mm polyp was found in the ascending colon. The polyp was sessile. The polyp was removed with a cold snare.  - A 5 mm polyp was found in the descending colon. The polyp was sessile. The polyp was removed with a cold snare. Resection and retrieval were complete. - A 4 mm polyp was found in the sigmoid colon. The polyp was sessile. The polyp was removed with a cold snare. - The terminal ileum appeared normal. - Multiple medium-mouthed diverticula were found in the left colon. - Internal hemorrhoids were found during retroflexion. - The exam was otherwise without abnormality. No inflammatory changes were noted. - Biopsies for histology were taken with a cold forceps from the right colon and left colon for evaluation of microscopic colitis  Surgical [P], descending, ascending, cecum and sigmoid, polyp (4) - TUBULAR ADENOMA. - SESSILE SERRATED ADENOMA(S) WITHOUT CYTOLOGIC DYSPLASIA (MULTIPLE FRAGMENTS). - HYPERPLASTIC POLYP. - HIGH GRADE  DYSPLASIA IS NOT IDENTIFIED. 2. Surgical [P], apendiceal orifice, biopsy - SESSILE SERRATED POLYP WITHOUT CYTOLOGIC DYSPLASIA. 3. Surgical [P], random site, right and left colon, biopsy - BENIGN COLONIC MUCOSA. - NO SIGNIFICANT INFLAMMATION OR OTHER ABNORMALITIES IDENTIFIED   Recent Imaging and Labs: DG Chest Portable 1 View  Result Date: 07/03/2022 CLINICAL DATA:  Altered mental status EXAM: PORTABLE CHEST 1 VIEW COMPARISON:  03/06/2022 FINDINGS: Prior CABG. Heart is borderline in size. Bibasilar linear atelectasis or scarring. No effusions or edema. No acute bony abnormality. IMPRESSION: Bibasilar atelectasis or scarring. Electronically Signed   By: Rolm Baptise M.D.   On: 07/03/2022 00:16    CT HEAD WO CONTRAST (5MM)  Result Date: 07/02/2022 CLINICAL DATA:  Weakness for 2 days with left facial droop EXAM: CT HEAD WITHOUT CONTRAST TECHNIQUE: Contiguous axial images were obtained from the base of the skull through the vertex without intravenous contrast. RADIATION DOSE REDUCTION: This exam was performed according to the departmental dose-optimization program which includes automated exposure control, adjustment of the mA and/or kV according to patient size and/or use of iterative reconstruction technique. COMPARISON:  02/06/2022 FINDINGS: Brain: No evidence of acute infarction, hemorrhage, hydrocephalus, extra-axial collection or mass lesion/mass effect. Mild atrophic changes and chronic white matter ischemic changes are noted. Vascular: No hyperdense vessel or unexpected calcification. Skull: Normal. Negative for fracture or focal lesion. Sinuses/Orbits: No acute finding. Other: None. IMPRESSION: Chronic atrophic and ischemic changes.  No acute abnormality noted. Electronically Signed   By: Inez Catalina M.D.   On: 07/02/2022 23:45      Labs:  Recent Labs    07/05/22 0659 07/06/22 0720 07/07/22 0802  WBC 13.2* 13.1* 16.0*  HGB 10.4* 10.2* 10.2*  HCT 31.3* 29.1* 30.4*  PLT 247 246 283   Recent Labs    07/05/22 0659 07/06/22 0720 07/07/22 0802  NA 135 133* 137  K 3.6 3.2* 4.3  CL 99 103 107  CO2 19* 19* 18*  GLUCOSE 107* 116* 120*  BUN 80* 70* 65*  CREATININE 3.32* 2.93* 2.89*  CALCIUM 8.2* 8.4* 9.3   No results for input(s): "PROT", "ALBUMIN", "AST", "ALT", "ALKPHOS", "BILITOT", "BILIDIR", "IBILI" in the last 72 hours. No results for input(s): "HEPBSAG", "HCVAB", "HEPAIGM", "HEPBIGM" in the last 72 hours. No results for input(s): "LABPROT", "INR" in the last 72 hours.  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 Most likely scar. Mild subendocardial ischemia. EF 71% LOW RISK.    Cervical disc disease    fracture   Chronic kidney disease    COPD (chronic obstructive pulmonary disease) (HCC)    Diverticulosis    DVT (deep venous thrombosis) (HCC)    RLE; Sept '23   History of DVT (deep vein thrombosis) 03/06/2022   History  of pulmonary embolus (PE)    Oct '23   Hyperlipidemia    Hypertension    Hypothyroidism (acquired)    hypo   Myocardial infarction [facility]) 11/2014   TIA   Polymyalgia rheumatica ([place])    2011 Dr. Charlestine Night   Pulmonary emboli [facility]) 02/06/2022   01/2022 PE/DVT post-op. Lower GI bleed     Treat edema  Start Eliquis at the reduced dose due to Creat>1.5 and age >95   Rheumatoid arthritis ([place]) 2011   Dr.Truslow; fracture knees, hands and wrists -    S/P CABG x 1 1996   CABG--LIMA-LAD for ostial LAD (not felt to be PCI amenable). EF NORMAL then; LIMA now atretic   Shortness of breath dyspnea    with exertion   Stroke ([place]) 11/2014   TIA    Urinary frequency     Past Surgical History:  Procedure Laterality Date   ABDOMINAL AORTIC ANEURYSM REPAIR  [id number]   Complicated by mesenteric artery stenosis and splenic artery infarction with acquired Asplenia   APPENDECTOMY     BUNIONECTOMY  07/2011   right foot   CARDIAC CATHETERIZATION  2005   (Most recent CATH) - ostial LAD lesion 20-30% (down from 90% initially). Atretic LIMA. Minimal disease the RCA and Circumflex system.    CARPAL TUNNEL RELEASE Left    CATARACT EXTRACTION Bilateral    CERVICAL SPINE SURGERY     plate 2008 Dr. Saintclair Halsted   COLONOSCOPY     CORONARY ARTERY BYPASS GRAFT  1996   INCLUDED AN INTERNAL MAMMARY ARTERY TO THE LAD. EF WAS NORMAL   INGUINAL HERNIA REPAIR Right    LAPAROSCOPIC APPENDECTOMY N/A 06/28/2016   Procedure: APPENDECTOMY LAPAROSCOPIC;  Surgeon: Leighton Ruff, MD;  Location: [facility];  Service: General;  Laterality: N/A;   NM MYOVIEW LTD  06/2010   Fixed anteroseptal, apical and inferoapical defect with moderate size. Most likely scar. Mild subendocardial ischemia. EF 71% LOW RISK.    SPLENECTOMY     TOTAL HIP ARTHROPLASTY Left 01/23/2022   Procedure: LEFT TOTAL HIP ARTHROPLASTY ANTERIOR APPROACH;  Surgeon: Leandrew Koyanagi, MD;  Location: [place];  Service: Orthopedics;  Laterality: Left;  3-C   TRANSTHORACIC ECHOCARDIOGRAM  12/2014   [facility]: Normal LV size & function. EF 55-60%,    vagina polyp     VIDEO ASSISTED THORACOSCOPY (VATS)/WEDGE RESECTION Left 03/02/2015   Procedure: VIDEO ASSISTED THORACOSCOPY (VATS), MINI THORACOTOMY, LEFT UPPER LOBE WEDGE, TAKE DOWN OF INTERNAL MAMMARY LESIONS, PLACEMENT OF ON-Q PUMP;  Surgeon: Grace Isaac, MD;  Location: [facility];  Service: Thoracic;  Laterality: Left;   VIDEO BRONCHOSCOPY N/A 03/02/2015   Procedure: BRONCHOSCOPY;  Surgeon: Grace Isaac, MD;  Location: [place];  Service: Thoracic;  Laterality: N/A;   VIDEO BRONCHOSCOPY WITH ENDOBRONCHIAL NAVIGATION N/A 10/08/2017   Procedure: VIDEO BRONCHOSCOPY WITH ENDOBRONCHIAL NAVIGATION WITH BIOPSIES OF LEFT UPPER LOBE AND LEFT LOWER LOBE;  Surgeon: Grace Isaac, MD;  Location: [facility];  Service: Thoracic;  Laterality: N/A;    Family History  Problem Relation Age of Onset   Hypertension Mother    Diabetes Mother    Heart disease Mother    Hyperlipidemia Mother    Stroke Father    Hyperlipidemia Sister    Hypertension Sister    Hypertension Daughter    Cancer Paternal Uncle         Deceased from cancer not sure of site   Hypertension Son    Hyperlipidemia Son    Hyperlipidemia  Son    Hypertension Son    Hyperlipidemia Son    Hypertension Son    Hypertension Other    Coronary artery disease Other    Asthma Neg Hx    Colon cancer Neg Hx     Prior to Admission medications   Medication Sig Start Date End Date Taking? Authorizing Provider  acetaminophen (TYLENOL) 325 MG tablet Take 2 tablets (650 mg total) by mouth 2 (two) times daily as needed (generalized pain). 03/27/22  Yes Mast, Man X, NP  albuterol (VENTOLIN HFA) 108 (90 Base) MCG/ACT inhaler Inhale 1-2 puffs into the lungs every 4 (four) hours as needed for wheezing or shortness of breath. 03/27/22  Yes Mast, Man X, NP  apixaban (ELIQUIS) 2.5 MG TABS tablet Take 1 tablet (2.5 mg total) by mouth 2 (two) times daily. 04/28/22  Yes Plotnikov, [name], MD  ascorbic acid (VITAMIN C) 500 MG tablet Take 1 tablet (500 mg total) by mouth daily. 03/27/22  Yes Mast, Man X, NP  atorvastatin (LIPITOR) 80 MG tablet Take 1 tablet (80 mg total) by mouth daily. 03/27/22  Yes Mast, Man X, NP  carvedilol (COREG) 3.125 MG tablet Take 1 tablet (3.125 mg total) by mouth 2 (two) times daily with a meal. 05/23/22  Yes Plotnikov, [name], MD  Cyanocobalamin 2500 MCG TABS Take 2,500 mcg by mouth daily. 03/27/22  Yes Mast, Man X, NP  docusate sodium (COLACE) 100 MG capsule Take 1-2 capsules (100-200 mg total) by mouth daily as needed for mild constipation. 03/27/22  Yes Mast, Man X, NP  Golimumab 50 MG/0.5ML SOAJ Take 50 mg by mouth every 2 (two) months. Hold for the next 2 weeks per Orthopedic surgery recommendations. 03/27/22  Yes Mast, Man X, NP  leflunomide (ARAVA) 20 MG tablet Take 1 tablet (20 mg total) by mouth daily. 03/27/22  Yes Mast, Man X, NP  levothyroxine (SYNTHROID) 88 MCG tablet Take 1 tablet (88 mcg total) by mouth daily before breakfast. 03/27/22  Yes Mast, Man X, NP  Multiple Vitamins-Minerals (CENTRUM ADULT PO)  Take 1 tablet by mouth daily. 1 Tablet Daily.   Yes [provider]  polyethylene glycol (MIRALAX / GLYCOLAX) 17 g packet Take 17 g by mouth daily as needed. Patient taking differently: Take 17 g by mouth daily as needed for mild constipation. 03/27/22  Yes Mast, Man X, NP  triamcinolone (NASACORT) 55 MCG/ACT AERO nasal inhaler Place 1 spray into the nose daily. 03/27/22  Yes Mast, Man X, NP  Cholecalciferol (VITAMIN D3) 50 MCG (2000 UT) capsule Take 1 capsule (2,000 Units total) by mouth daily. 03/27/22   Mast, Man X, NP  gabapentin (NEURONTIN) 300 MG capsule Take 1 capsule (300 mg total) by mouth at bedtime. 03/27/22   Mast, Man X, NP    Current Facility-Administered Medications  Medication Dose Route Frequency Provider Last Rate Last Admin   0.9 %  sodium chloride infusion   Intravenous Continuous Kristopher Oppenheim, DO 75 mL/hr at 07/06/22 0517 New Bag at 07/06/22 0517   acetaminophen (TYLENOL) tablet 650 mg  650 mg Oral Q6H PRN Kristopher Oppenheim, DO   650 mg at 07/03/22 1027   Or   acetaminophen (TYLENOL) suppository 650 mg  650 mg Rectal Q6H PRN Kristopher Oppenheim, DO       amLODipine (NORVASC) tablet 10 mg  10 mg Oral Daily Elgergawy, Silver Huguenin, MD   10 mg at 07/07/22 0930   atorvastatin (LIPITOR) tablet 80 mg  80 mg Oral Daily Kristopher Oppenheim,  DO   80 mg at 07/07/22 0930   carvedilol (COREG) tablet 6.25 mg  6.25 mg Oral BID WC Elgergawy, Silver Huguenin, MD   6.25 mg at 07/07/22 0931   ceFAZolin (ANCEF) IVPB 2g/100 mL premix  2 g Intravenous Q12H Elgergawy, Silver Huguenin, MD 200 mL/hr at 07/07/22 0932 2 g at 07/07/22 0932   hydrALAZINE (APRESOLINE) injection 5 mg  5 mg Intravenous Q6H PRN Elgergawy, Silver Huguenin, MD   5 mg at 07/07/22 0057   hydrALAZINE (APRESOLINE) tablet 25 mg  25 mg Oral Q6H PRN Mariel Aloe, MD   25 mg at 07/07/22 0456   levothyroxine (SYNTHROID) tablet 88 mcg  88 mcg Oral QAC breakfast Kristopher Oppenheim, DO   88 mcg at 07/07/22 [id number]   magnesium citrate solution 1 Bottle  1 Bottle Oral Once Elgergawy,  Silver Huguenin, MD       ondansetron (ZOFRAN) tablet 4 mg  4 mg Oral Q6H PRN Kristopher Oppenheim, DO       Or   ondansetron [facility]) injection 4 mg  4 mg Intravenous Q6H PRN Kristopher Oppenheim, DO       pantoprazole (PROTONIX) injection 40 mg  40 mg Intravenous Q12H Elgergawy, Silver Huguenin, MD   40 mg at 07/07/22 1324   polyethylene glycol (MIRALAX / GLYCOLAX) packet 17 g  17 g Oral Daily PRN Elgergawy, Silver Huguenin, MD       triamcinolone (NASACORT) nasal inhaler 1 spray  1 spray Each Nare Daily Kristopher Oppenheim, DO   1 spray at 07/07/22 [id number]   witch hazel-glycerin (TUCKS) pad 1 Application  1 Application Topical PRN Elgergawy, Silver Huguenin, MD        Allergies as of 07/02/2022 - Review Complete 07/02/2022  Allergen Reaction Noted   Nsaids Other (See Comments) 01/16/2022   Aspirin Other (See Comments)    Codeine Nausea And Vomiting    Nitrostat [nitroglycerin] Other (See Comments)     Social History   Socioeconomic History   Marital status: Married    Spouse name: Not on file   Number of children: 4   Years of education: Not on file   Highest education level: Not on file  Occupational History   Occupation: retired    Fish farm manager: RETIRED  Tobacco Use   Smoking status: Former    Types: Cigarettes    Quit date: 12/04/1958    Years since quitting: 63.6    Passive exposure: Never   Smokeless tobacco: Never  Vaping Use   Vaping Use: Never used  Substance and Sexual Activity   Alcohol use: Yes    Comment: hardly ever   Drug use: No   Sexual activity: Not Currently  Other Topics Concern   Not on file  Social History Narrative   [facility] [employer]  MARRIED -RETIRED ; 4 [place].    Living at [place] since dec 2015   Social Determinants of Health   Financial Resource Strain: Low Risk  (06/13/2022)   Overall Financial Resource Strain (CARDIA)    Difficulty of Paying Living Expenses: Not hard at all  Food Insecurity: No Food Insecurity (06/13/2022)   Hunger Vital Sign    Worried  About Running Out of Food in the Last Year: Never true    Ran Out of Food in the Last Year: Never true  Transportation Needs: No Transportation Needs (06/13/2022)   PRAPARE - Hydrologist (Medical): No    Lack of Transportation (Non-Medical):  No  Physical Activity: Inactive (06/13/2022)   Exercise Vital Sign    Days of Exercise per Week: 0 days    Minutes of Exercise per Session: 0 min  Stress: No Stress Concern Present (06/13/2022)   [place]    Feeling of Stress : Only a little  Social Connections: Unknown (06/13/2022)   Social Connection and Isolation Panel [NHANES]    Frequency of Communication with Friends and Family: Once a week    Frequency of Social Gatherings with Friends and Family: Once a week    Attends Religious Services: Not on Advertising copywriter or Organizations: Yes    Attends Archivist Meetings: More than 4 times per year    Marital Status: Married  Human resources officer Violence: Not At Risk (06/15/2022)   Humiliation, Afraid, Rape, and Kick questionnaire    Fear of Current or Ex-Partner: No    Emotionally Abused: No    Physically Abused: No    Sexually Abused: No    Review of Systems: All systems reviewed and negative except where noted in HPI.  Physical Exam: Vital signs in last 24 hours: Temp:  [97.3 F (36.3 C)-98.4 F (36.9 C)] 97.4 F (36.3 C) (03/08 0800) Pulse Rate:  [85-98] 91 (03/08 1131) Resp:  [13-33] 21 (03/08 1131) BP: (96-180)/(58-83) 107/83 (03/08 1118) SpO2:  [94 %-99 %] 97 % (03/08 1131) Weight:  [69.3 kg] 69.3 kg (03/08 0349) Last BM Date : 07/07/22  General:  Alert female in NAD Psych:  Pleasant, cooperative. Normal mood and affect Eyes: Pupils equal Ears:  Normal auditory acuity Nose: No deformity, discharge or lesions Neck:  Supple, no masses felt Lungs:  Clear to auscultation.  Heart:  Regular rate, regular rhythm.   Abdomen:  Soft, nondistended, nontender, active bowel sounds, no masses felt Rectal :  Nothing in vault on DRE.  Msk: Symmetrical without gross deformities.  Neurologic:  Alert, oriented, grossly normal neurologically Extremities : No edema Skin:  Intact without significant lesions.    Intake/Output from previous day: 03/07 0701 - 03/08 0700 In: 6422.1 [P.O.:1380; I.V.:4763.6; IV Piggyback:278.5] Out: -  Intake/Output this shift:  Total I/O In: 320 [P.O.:320] Out: -     Principal Problem:   Acute pyelonephritis Active Problems:   Acquired hypothyroidism   Essential hypertension   Rheumatoid arthritis ([place])   Long term (current) use of anticoagulants   Hx of CABG   Hyperlipidemia   COPD (chronic obstructive pulmonary disease) (HCC)   CKD (chronic kidney disease) stage 4, GFR 15-29 ml/min (HCC) - baseline SCr 2.5. follows with Dr. Royce Macadamia with [facility]   Acute renal failure superimposed on stage 4 chronic kidney disease ([place])   Metabolic acidosis, increased anion gap   Generalized weakness   History of pulmonary embolism   Acute metabolic encephalopathy   Hyponatremia   Sepsis with acute renal failure ([place])   DNR (do not resuscitate)/DNI(Do Not Intubate)   E coli bacteremia    Tye Savoy, NP-C @  07/07/2022, 1:58 PM

## 2022-07-07 NOTE — Consult Note (Addendum)
Consultation Note   Referring Provider:  [facility] PCP: Cassandria Anger, MD Primary Gastroenterologist: Little Silver Cellar, MD Reason for consultation:   [facility] Day: 6  ASSESSMENT  #82 yo female admitted with sepsis / acute metabolic encephalopathy/ cystitis / E.coli bacteremia /  AKI on CKD Renal function improving. Cr 4.7 >>2.8. Baseline Cr ~ 2.5.  On Ceftriaxone  # History of PE, on Eliquis  # Melena on Eliquis / anemia. Large black, loose BM this am. FOBT+.  Baseline hgb ~ 12.Hgb, down to 10.2 this admission after IVF.    # PE , on Eliquis Last dose of Eliquis was 9pm last night  # Hx of adenomatous colon polyps.  Given her age and comorbidities she is no longer undergoing surveillance exams.    # History of appendectomy for large precancerous polyp invading the appendix in 2017.     PLAN -Continue BID IV PPI -CBC now then Q 8 hours. -Eliquis is on hold -Reduce diet to clear liquids, NPO after MN for possible EGD tomorrow. -The risks and benefits of EGD with possible biopsies were discussed with the patient who agrees to proceed.    HPI:  Patient is a 82 y.o. 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    See PMH for any additional medical problems.  Patient admitted 3/4. with acute metabolic encephalopathy, hyponatremia, AKI on CKD. She is being treated for sepsis / acute pyelonephritis. Mentation okay today. Patient had a large, loose black BM early this am. Nursing staff told her it was black. Hemoccult ordered and positive. No NSAID use. She has no focal GI symptoms such as abdominal pain, N/V.    Previous GI History / Evaluation :   Most recent colonoscopy in 2017 - A 5 mm polyp was found in the  cecum. The polyp was sessile. The polyp was removed with a cold snare.  - A medium polyp was found in the appendiceal orifice, the borders could not be seen and appeared to be invading the appendix. The polyp was sessile. Biopsies were taken with a cold forceps for histology. - A 8 mm polyp was found in the ascending colon. The polyp was sessile. The polyp was removed with a cold snare.  - A 5 mm polyp was found in the descending colon. The polyp was sessile. The polyp was removed with a cold snare. Resection and retrieval were complete. - A 4 mm polyp was found in the sigmoid colon. The polyp was sessile. The polyp was removed with a cold snare. - The terminal ileum appeared normal. - Multiple medium-mouthed diverticula were found in the left colon. - Internal hemorrhoids were found during retroflexion. - The exam was otherwise without abnormality. No inflammatory changes were noted. - Biopsies for histology were taken with a cold forceps from the right colon and left colon for evaluation of microscopic colitis  Surgical [P], descending, ascending, cecum and sigmoid, polyp (4) - TUBULAR ADENOMA. - SESSILE SERRATED ADENOMA(S) WITHOUT CYTOLOGIC DYSPLASIA (MULTIPLE FRAGMENTS). - HYPERPLASTIC POLYP. - HIGH GRADE  DYSPLASIA IS NOT IDENTIFIED. 2. Surgical [P], apendiceal orifice, biopsy - SESSILE SERRATED POLYP WITHOUT CYTOLOGIC DYSPLASIA. 3. Surgical [P], random site, right and left colon, biopsy - BENIGN COLONIC MUCOSA. - NO SIGNIFICANT INFLAMMATION OR OTHER ABNORMALITIES IDENTIFIED   Recent Imaging and Labs: DG Chest Portable 1 View  Result Date: 07/03/2022 CLINICAL DATA:  Altered mental status EXAM: PORTABLE CHEST 1 VIEW COMPARISON:  03/06/2022 FINDINGS: Prior CABG. Heart is borderline in size. Bibasilar linear atelectasis or scarring. No effusions or edema. No acute bony abnormality. IMPRESSION: Bibasilar atelectasis or scarring. Electronically Signed   By: Rolm Baptise M.D.   On: 07/03/2022 00:16    CT HEAD WO CONTRAST (5MM)  Result Date: 07/02/2022 CLINICAL DATA:  Weakness for 2 days with left facial droop EXAM: CT HEAD WITHOUT CONTRAST TECHNIQUE: Contiguous axial images were obtained from the base of the skull through the vertex without intravenous contrast. RADIATION DOSE REDUCTION: This exam was performed according to the departmental dose-optimization program which includes automated exposure control, adjustment of the mA and/or kV according to patient size and/or use of iterative reconstruction technique. COMPARISON:  02/06/2022 FINDINGS: Brain: No evidence of acute infarction, hemorrhage, hydrocephalus, extra-axial collection or mass lesion/mass effect. Mild atrophic changes and chronic white matter ischemic changes are noted. Vascular: No hyperdense vessel or unexpected calcification. Skull: Normal. Negative for fracture or focal lesion. Sinuses/Orbits: No acute finding. Other: None. IMPRESSION: Chronic atrophic and ischemic changes.  No acute abnormality noted. Electronically Signed   By: Inez Catalina M.D.   On: 07/02/2022 23:45      Labs:  Recent Labs    07/05/22 0659 07/06/22 0720 07/07/22 0802  WBC 13.2* 13.1* 16.0*  HGB 10.4* 10.2* 10.2*  HCT 31.3* 29.1* 30.4*  PLT 247 246 283   Recent Labs    07/05/22 0659 07/06/22 0720 07/07/22 0802  NA 135 133* 137  K 3.6 3.2* 4.3  CL 99 103 107  CO2 19* 19* 18*  GLUCOSE 107* 116* 120*  BUN 80* 70* 65*  CREATININE 3.32* 2.93* 2.89*  CALCIUM 8.2* 8.4* 9.3   No results for input(s): "PROT", "ALBUMIN", "AST", "ALT", "ALKPHOS", "BILITOT", "BILIDIR", "IBILI" in the last 72 hours. No results for input(s): "HEPBSAG", "HCVAB", "HEPAIGM", "HEPBIGM" in the last 72 hours. No results for input(s): "LABPROT", "INR" in the last 72 hours.  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 Most likely scar. Mild subendocardial ischemia. EF 71% LOW RISK.    Cervical disc disease    fracture   Chronic kidney disease    COPD (chronic obstructive pulmonary disease) (HCC)    Diverticulosis    DVT (deep venous thrombosis) (HCC)    RLE; Sept '23   History of DVT (deep vein thrombosis) 03/06/2022   History  of pulmonary embolus (PE)    Oct '23   Hyperlipidemia    Hypertension    Hypothyroidism (acquired)    hypo   Myocardial infarction [facility]) 11/2014   TIA   Polymyalgia rheumatica ([place])    2011 Dr. Charlestine Night   Pulmonary emboli [facility]) 02/06/2022   01/2022 PE/DVT post-op. Lower GI bleed     Treat edema  Start Eliquis at the reduced dose due to Creat>1.5 and age >75   Rheumatoid arthritis ([place]) 2011   Dr.Truslow; fracture knees, hands and wrists -    S/P CABG x 1 1996   CABG--LIMA-LAD for ostial LAD (not felt to be PCI amenable). EF NORMAL then; LIMA now atretic   Shortness of breath dyspnea    with exertion   Stroke ([place]) 11/2014   TIA    Urinary frequency     Past Surgical History:  Procedure Laterality Date   ABDOMINAL AORTIC ANEURYSM REPAIR  [id number]   Complicated by mesenteric artery stenosis and splenic artery infarction with acquired Asplenia   APPENDECTOMY     BUNIONECTOMY  07/2011   right foot   CARDIAC CATHETERIZATION  2005   (Most recent CATH) - ostial LAD lesion 20-30% (down from 90% initially). Atretic LIMA. Minimal disease the RCA and Circumflex system.    CARPAL TUNNEL RELEASE Left    CATARACT EXTRACTION Bilateral    CERVICAL SPINE SURGERY     plate 2008 Dr. Saintclair Halsted   COLONOSCOPY     CORONARY ARTERY BYPASS GRAFT  1996   INCLUDED AN INTERNAL MAMMARY ARTERY TO THE LAD. EF WAS NORMAL   INGUINAL HERNIA REPAIR Right    LAPAROSCOPIC APPENDECTOMY N/A 06/28/2016   Procedure: APPENDECTOMY LAPAROSCOPIC;  Surgeon: Leighton Ruff, MD;  Location: [facility];  Service: General;  Laterality: N/A;   NM MYOVIEW LTD  06/2010   Fixed anteroseptal, apical and inferoapical defect with moderate size. Most likely scar. Mild subendocardial ischemia. EF 71% LOW RISK.    SPLENECTOMY     TOTAL HIP ARTHROPLASTY Left 01/23/2022   Procedure: LEFT TOTAL HIP ARTHROPLASTY ANTERIOR APPROACH;  Surgeon: Leandrew Koyanagi, MD;  Location: [place];  Service: Orthopedics;  Laterality: Left;  3-C   TRANSTHORACIC ECHOCARDIOGRAM  12/2014   [facility]: Normal LV size & function. EF 55-60%,    vagina polyp     VIDEO ASSISTED THORACOSCOPY (VATS)/WEDGE RESECTION Left 03/02/2015   Procedure: VIDEO ASSISTED THORACOSCOPY (VATS), MINI THORACOTOMY, LEFT UPPER LOBE WEDGE, TAKE DOWN OF INTERNAL MAMMARY LESIONS, PLACEMENT OF ON-Q PUMP;  Surgeon: Grace Isaac, MD;  Location: [facility];  Service: Thoracic;  Laterality: Left;   VIDEO BRONCHOSCOPY N/A 03/02/2015   Procedure: BRONCHOSCOPY;  Surgeon: Grace Isaac, MD;  Location: [place];  Service: Thoracic;  Laterality: N/A;   VIDEO BRONCHOSCOPY WITH ENDOBRONCHIAL NAVIGATION N/A 10/08/2017   Procedure: VIDEO BRONCHOSCOPY WITH ENDOBRONCHIAL NAVIGATION WITH BIOPSIES OF LEFT UPPER LOBE AND LEFT LOWER LOBE;  Surgeon: Grace Isaac, MD;  Location: [facility];  Service: Thoracic;  Laterality: N/A;    Family History  Problem Relation Age of Onset   Hypertension Mother    Diabetes Mother    Heart disease Mother    Hyperlipidemia Mother    Stroke Father    Hyperlipidemia Sister    Hypertension Sister    Hypertension Daughter    Cancer Paternal Uncle         Deceased from cancer not sure of site   Hypertension Son    Hyperlipidemia Son    Hyperlipidemia  Son    Hypertension Son    Hyperlipidemia Son    Hypertension Son    Hypertension Other    Coronary artery disease Other    Asthma Neg Hx    Colon cancer Neg Hx     Prior to Admission medications   Medication Sig Start Date End Date Taking? Authorizing Provider  acetaminophen (TYLENOL) 325 MG tablet Take 2 tablets (650 mg total) by mouth 2 (two) times daily as needed (generalized pain). 03/27/22  Yes Mast, Man X, NP  albuterol (VENTOLIN HFA) 108 (90 Base) MCG/ACT inhaler Inhale 1-2 puffs into the lungs every 4 (four) hours as needed for wheezing or shortness of breath. 03/27/22  Yes Mast, Man X, NP  apixaban (ELIQUIS) 2.5 MG TABS tablet Take 1 tablet (2.5 mg total) by mouth 2 (two) times daily. 04/28/22  Yes Plotnikov, [name], MD  ascorbic acid (VITAMIN C) 500 MG tablet Take 1 tablet (500 mg total) by mouth daily. 03/27/22  Yes Mast, Man X, NP  atorvastatin (LIPITOR) 80 MG tablet Take 1 tablet (80 mg total) by mouth daily. 03/27/22  Yes Mast, Man X, NP  carvedilol (COREG) 3.125 MG tablet Take 1 tablet (3.125 mg total) by mouth 2 (two) times daily with a meal. 05/23/22  Yes Plotnikov, [name], MD  Cyanocobalamin 2500 MCG TABS Take 2,500 mcg by mouth daily. 03/27/22  Yes Mast, Man X, NP  docusate sodium (COLACE) 100 MG capsule Take 1-2 capsules (100-200 mg total) by mouth daily as needed for mild constipation. 03/27/22  Yes Mast, Man X, NP  Golimumab 50 MG/0.5ML SOAJ Take 50 mg by mouth every 2 (two) months. Hold for the next 2 weeks per Orthopedic surgery recommendations. 03/27/22  Yes Mast, Man X, NP  leflunomide (ARAVA) 20 MG tablet Take 1 tablet (20 mg total) by mouth daily. 03/27/22  Yes Mast, Man X, NP  levothyroxine (SYNTHROID) 88 MCG tablet Take 1 tablet (88 mcg total) by mouth daily before breakfast. 03/27/22  Yes Mast, Man X, NP  Multiple Vitamins-Minerals (CENTRUM ADULT PO)  Take 1 tablet by mouth daily. 1 Tablet Daily.   Yes [provider]  polyethylene glycol (MIRALAX / GLYCOLAX) 17 g packet Take 17 g by mouth daily as needed. Patient taking differently: Take 17 g by mouth daily as needed for mild constipation. 03/27/22  Yes Mast, Man X, NP  triamcinolone (NASACORT) 55 MCG/ACT AERO nasal inhaler Place 1 spray into the nose daily. 03/27/22  Yes Mast, Man X, NP  Cholecalciferol (VITAMIN D3) 50 MCG (2000 UT) capsule Take 1 capsule (2,000 Units total) by mouth daily. 03/27/22   Mast, Man X, NP  gabapentin (NEURONTIN) 300 MG capsule Take 1 capsule (300 mg total) by mouth at bedtime. 03/27/22   Mast, Man X, NP    Current Facility-Administered Medications  Medication Dose Route Frequency Provider Last Rate Last Admin   0.9 %  sodium chloride infusion   Intravenous Continuous Kristopher Oppenheim, DO 75 mL/hr at 07/06/22 0517 New Bag at 07/06/22 0517   acetaminophen (TYLENOL) tablet 650 mg  650 mg Oral Q6H PRN Kristopher Oppenheim, DO   650 mg at 07/03/22 1027   Or   acetaminophen (TYLENOL) suppository 650 mg  650 mg Rectal Q6H PRN Kristopher Oppenheim, DO       amLODipine (NORVASC) tablet 10 mg  10 mg Oral Daily Elgergawy, Silver Huguenin, MD   10 mg at 07/07/22 0930   atorvastatin (LIPITOR) tablet 80 mg  80 mg Oral Daily Kristopher Oppenheim,  DO   80 mg at 07/07/22 0930   carvedilol (COREG) tablet 6.25 mg  6.25 mg Oral BID WC Elgergawy, Silver Huguenin, MD   6.25 mg at 07/07/22 0931   ceFAZolin (ANCEF) IVPB 2g/100 mL premix  2 g Intravenous Q12H Elgergawy, Silver Huguenin, MD 200 mL/hr at 07/07/22 0932 2 g at 07/07/22 0932   hydrALAZINE (APRESOLINE) injection 5 mg  5 mg Intravenous Q6H PRN Elgergawy, Silver Huguenin, MD   5 mg at 07/07/22 0057   hydrALAZINE (APRESOLINE) tablet 25 mg  25 mg Oral Q6H PRN Mariel Aloe, MD   25 mg at 07/07/22 0456   levothyroxine (SYNTHROID) tablet 88 mcg  88 mcg Oral QAC breakfast Kristopher Oppenheim, DO   88 mcg at 07/07/22 [id number]   magnesium citrate solution 1 Bottle  1 Bottle Oral Once Elgergawy,  Silver Huguenin, MD       ondansetron (ZOFRAN) tablet 4 mg  4 mg Oral Q6H PRN Kristopher Oppenheim, DO       Or   ondansetron [facility]) injection 4 mg  4 mg Intravenous Q6H PRN Kristopher Oppenheim, DO       pantoprazole (PROTONIX) injection 40 mg  40 mg Intravenous Q12H Elgergawy, Silver Huguenin, MD   40 mg at 07/07/22 1324   polyethylene glycol (MIRALAX / GLYCOLAX) packet 17 g  17 g Oral Daily PRN Elgergawy, Silver Huguenin, MD       triamcinolone (NASACORT) nasal inhaler 1 spray  1 spray Each Nare Daily Kristopher Oppenheim, DO   1 spray at 07/07/22 [id number]   witch hazel-glycerin (TUCKS) pad 1 Application  1 Application Topical PRN Elgergawy, Silver Huguenin, MD        Allergies as of 07/02/2022 - Review Complete 07/02/2022  Allergen Reaction Noted   Nsaids Other (See Comments) 01/16/2022   Aspirin Other (See Comments)    Codeine Nausea And Vomiting    Nitrostat [nitroglycerin] Other (See Comments)     Social History   Socioeconomic History   Marital status: Married    Spouse name: Not on file   Number of children: 4   Years of education: Not on file   Highest education level: Not on file  Occupational History   Occupation: retired    Fish farm manager: RETIRED  Tobacco Use   Smoking status: Former    Types: Cigarettes    Quit date: 12/04/1958    Years since quitting: 63.6    Passive exposure: Never   Smokeless tobacco: Never  Vaping Use   Vaping Use: Never used  Substance and Sexual Activity   Alcohol use: Yes    Comment: hardly ever   Drug use: No   Sexual activity: Not Currently  Other Topics Concern   Not on file  Social History Narrative   [facility] [employer]  MARRIED -RETIRED ; 4 [place].    Living at [place] since dec 2015   Social Determinants of Health   Financial Resource Strain: Low Risk  (06/13/2022)   Overall Financial Resource Strain (CARDIA)    Difficulty of Paying Living Expenses: Not hard at all  Food Insecurity: No Food Insecurity (06/13/2022)   Hunger Vital Sign    Worried  About Running Out of Food in the Last Year: Never true    Ran Out of Food in the Last Year: Never true  Transportation Needs: No Transportation Needs (06/13/2022)   PRAPARE - Hydrologist (Medical): No    Lack of Transportation (Non-Medical):  No  Physical Activity: Inactive (06/13/2022)   Exercise Vital Sign    Days of Exercise per Week: 0 days    Minutes of Exercise per Session: 0 min  Stress: No Stress Concern Present (06/13/2022)   [place]    Feeling of Stress : Only a little  Social Connections: Unknown (06/13/2022)   Social Connection and Isolation Panel [NHANES]    Frequency of Communication with Friends and Family: Once a week    Frequency of Social Gatherings with Friends and Family: Once a week    Attends Religious Services: Not on Advertising copywriter or Organizations: Yes    Attends Archivist Meetings: More than 4 times per year    Marital Status: Married  Human resources officer Violence: Not At Risk (06/15/2022)   Humiliation, Afraid, Rape, and Kick questionnaire    Fear of Current or Ex-Partner: No    Emotionally Abused: No    Physically Abused: No    Sexually Abused: No    Review of Systems: All systems reviewed and negative except where noted in HPI.  Physical Exam: Vital signs in last 24 hours: Temp:  [97.3 F (36.3 C)-98.4 F (36.9 C)] 97.4 F (36.3 C) (03/08 0800) Pulse Rate:  [85-98] 91 (03/08 1131) Resp:  [13-33] 21 (03/08 1131) BP: (96-180)/(58-83) 107/83 (03/08 1118) SpO2:  [94 %-99 %] 97 % (03/08 1131) Weight:  [69.3 kg] 69.3 kg (03/08 0349) Last BM Date : 07/07/22  General:  Alert female in NAD Psych:  Pleasant, cooperative. Normal mood and affect Eyes: Pupils equal Ears:  Normal auditory acuity Nose: No deformity, discharge or lesions Neck:  Supple, no masses felt Lungs:  Clear to auscultation.  Heart:  Regular rate, regular rhythm.   Abdomen:  Soft, nondistended, nontender, active bowel sounds, no masses felt Rectal :  Nothing in vault on DRE.  Msk: Symmetrical without gross deformities.  Neurologic:  Alert, oriented, grossly normal neurologically Extremities : No edema Skin:  Intact without significant lesions.    Intake/Output from previous day: 03/07 0701 - 03/08 0700 In: 6422.1 [P.O.:1380; I.V.:4763.6; IV Piggyback:278.5] Out: -  Intake/Output this shift:  Total I/O In: 320 [P.O.:320] Out: -     Principal Problem:   Acute pyelonephritis Active Problems:   Acquired hypothyroidism   Essential hypertension   Rheumatoid arthritis ([place])   Long term (current) use of anticoagulants   Hx of CABG   Hyperlipidemia   COPD (chronic obstructive pulmonary disease) (HCC)   CKD (chronic kidney disease) stage 4, GFR 15-29 ml/min (HCC) - baseline SCr 2.5. follows with Dr. Royce Macadamia with [facility]   Acute renal failure superimposed on stage 4 chronic kidney disease ([place])   Metabolic acidosis, increased anion gap   Generalized weakness   History of pulmonary embolism   Acute metabolic encephalopathy   Hyponatremia   Sepsis with acute renal failure ([place])   DNR (do not resuscitate)/DNI(Do Not Intubate)   E coli bacteremia    Tye Savoy, NP-C @  07/07/2022, 1:58 PM

## 2022-07-08 ENCOUNTER — Encounter (HOSPITAL_COMMUNITY)
Admission: EM | Disposition: A | Discharge status: Skilled Nursing Facility | Payer: Self-pay | Source: Home / Self Care | Attending: Family Medicine

## 2022-07-08 ENCOUNTER — Inpatient Hospital Stay (HOSPITAL_COMMUNITY): Payer: Medicare PPO | Admitting: Anesthesiology

## 2022-07-08 ENCOUNTER — Encounter (HOSPITAL_COMMUNITY): Payer: Self-pay | Admitting: Internal Medicine

## 2022-07-08 ENCOUNTER — Inpatient Hospital Stay (HOSPITAL_COMMUNITY): Payer: Medicare PPO

## 2022-07-08 DIAGNOSIS — K253 Acute gastric ulcer without hemorrhage or perforation: Secondary | ICD-10-CM

## 2022-07-08 DIAGNOSIS — I2510 Atherosclerotic heart disease of native coronary artery without angina pectoris: Secondary | ICD-10-CM

## 2022-07-08 DIAGNOSIS — K3189 Other diseases of stomach and duodenum: Secondary | ICD-10-CM

## 2022-07-08 DIAGNOSIS — K250 Acute gastric ulcer with hemorrhage: Secondary | ICD-10-CM | POA: Diagnosis not present

## 2022-07-08 DIAGNOSIS — K921 Melena: Secondary | ICD-10-CM | POA: Diagnosis not present

## 2022-07-08 DIAGNOSIS — Z87891 Personal history of nicotine dependence: Secondary | ICD-10-CM

## 2022-07-08 DIAGNOSIS — K269 Duodenal ulcer, unspecified as acute or chronic, without hemorrhage or perforation: Secondary | ICD-10-CM | POA: Diagnosis not present

## 2022-07-08 DIAGNOSIS — I129 Hypertensive chronic kidney disease with stage 1 through stage 4 chronic kidney disease, or unspecified chronic kidney disease: Secondary | ICD-10-CM

## 2022-07-08 DIAGNOSIS — K449 Diaphragmatic hernia without obstruction or gangrene: Secondary | ICD-10-CM | POA: Diagnosis not present

## 2022-07-08 DIAGNOSIS — K209 Esophagitis, unspecified without bleeding: Secondary | ICD-10-CM

## 2022-07-08 DIAGNOSIS — N189 Chronic kidney disease, unspecified: Secondary | ICD-10-CM

## 2022-07-08 DIAGNOSIS — D62 Acute posthemorrhagic anemia: Secondary | ICD-10-CM

## 2022-07-08 DIAGNOSIS — K279 Peptic ulcer, site unspecified, unspecified as acute or chronic, without hemorrhage or perforation: Secondary | ICD-10-CM | POA: Diagnosis not present

## 2022-07-08 DIAGNOSIS — N10 Acute tubulo-interstitial nephritis: Secondary | ICD-10-CM | POA: Diagnosis not present

## 2022-07-08 HISTORY — PX: BIOPSY: SHX5522

## 2022-07-08 HISTORY — PX: ESOPHAGOGASTRODUODENOSCOPY (EGD) WITH PROPOFOL: SHX5813

## 2022-07-08 LAB — CBC
HCT: 27 % — ABNORMAL LOW (ref 36.0–46.0)
Hemoglobin: 9.1 g/dL — ABNORMAL LOW (ref 12.0–15.0)
MCH: 30.8 pg (ref 26.0–34.0)
MCHC: 33.7 g/dL (ref 30.0–36.0)
MCV: 91.5 fL (ref 80.0–100.0)
Platelets: 275 10*3/uL (ref 150–400)
RBC: 2.95 MIL/uL — ABNORMAL LOW (ref 3.87–5.11)
RDW: 18.4 % — ABNORMAL HIGH (ref 11.5–15.5)
WBC: 14.6 10*3/uL — ABNORMAL HIGH (ref 4.0–10.5)
nRBC: 0.4 % — ABNORMAL HIGH (ref 0.0–0.2)

## 2022-07-08 LAB — BRAIN NATRIURETIC PEPTIDE: B Natriuretic Peptide: 382.7 pg/mL — ABNORMAL HIGH (ref 0.0–100.0)

## 2022-07-08 LAB — BASIC METABOLIC PANEL
Anion gap: 14 (ref 5–15)
BUN: 57 mg/dL — ABNORMAL HIGH (ref 8–23)
CO2: 15 mmol/L — ABNORMAL LOW (ref 22–32)
Calcium: 9.2 mg/dL (ref 8.9–10.3)
Chloride: 109 mmol/L (ref 98–111)
Creatinine, Ser: 3.08 mg/dL — ABNORMAL HIGH (ref 0.44–1.00)
GFR, Estimated: 14 mL/min — ABNORMAL LOW (ref >60)
Glucose, Bld: 106 mg/dL — ABNORMAL HIGH (ref 70–99)
Potassium: 4.2 mmol/L (ref 3.5–5.1)
Sodium: 138 mmol/L (ref 135–145)

## 2022-07-08 SURGERY — ESOPHAGOGASTRODUODENOSCOPY (EGD) WITH PROPOFOL
Anesthesia: Monitor Anesthesia Care

## 2022-07-08 MED ORDER — SODIUM CHLORIDE 0.9 % IV SOLN
1.5000 g | Freq: Two times a day (BID) | INTRAVENOUS | Status: AC
  Administered 2022-07-08 – 2022-07-13 (×11): 1.5 g via INTRAVENOUS
  Filled 2022-07-08 (×11): qty 4

## 2022-07-08 MED ORDER — IPRATROPIUM-ALBUTEROL 0.5-2.5 (3) MG/3ML IN SOLN
RESPIRATORY_TRACT | Status: AC
  Filled 2022-07-08: qty 3

## 2022-07-08 MED ORDER — FUROSEMIDE 10 MG/ML IJ SOLN
60.0000 mg | Freq: Once | INTRAMUSCULAR | Status: AC
  Administered 2022-07-08: 60 mg via INTRAVENOUS
  Filled 2022-07-08: qty 6

## 2022-07-08 MED ORDER — IPRATROPIUM-ALBUTEROL 0.5-2.5 (3) MG/3ML IN SOLN
3.0000 mL | Freq: Once | RESPIRATORY_TRACT | Status: AC
  Administered 2022-07-08: 3 mL via RESPIRATORY_TRACT

## 2022-07-08 MED ORDER — PROPOFOL 500 MG/50ML IV EMUL
INTRAVENOUS | Status: DC | PRN
  Administered 2022-07-08: 100 ug/kg/min via INTRAVENOUS

## 2022-07-08 MED ORDER — PROPOFOL 10 MG/ML IV BOLUS
INTRAVENOUS | Status: DC | PRN
  Administered 2022-07-08: 20 mg via INTRAVENOUS

## 2022-07-08 MED ORDER — LACTATED RINGERS IV SOLN: INTRAVENOUS | Status: DC | PRN

## 2022-07-08 MED ORDER — LACTATED RINGERS IV SOLN
INTRAVENOUS | Status: AC | PRN
  Administered 2022-07-08: 10 mL/h via INTRAVENOUS

## 2022-07-08 SURGICAL SUPPLY — 15 items

## 2022-07-08 NOTE — Plan of Care (Signed)

## 2022-07-08 NOTE — Anesthesia Preprocedure Evaluation (Addendum)
Anesthesia Evaluation  Patient identified by MRN, date of birth, ID band Patient awake    Reviewed: Allergy & Precautions, NPO status , Patient's Chart, lab work & pertinent test results  Airway Mallampati: II  TM Distance: >3 FB Neck ROM: Full    Dental no notable dental hx.    Pulmonary COPD,  COPD inhaler, former smoker, PE   Pulmonary exam normal        Cardiovascular hypertension, Pt. on medications and Pt. on home beta blockers + CAD, + Past MI, + CABG (1996), + Peripheral Vascular Disease, + DOE and + DVT   Rhythm:Regular Rate:Normal  ECHO 10/23:  1. Left ventricular ejection fraction, by estimation, is 65 to 70%. Left  ventricular ejection fraction by 3D volume is 68 %. The left ventricle has  normal function. The left ventricle has no regional wall motion  abnormalities. There is severe asymmetric   left ventricular hypertrophy. Left ventricular diastolic parameters are  consistent with Grade II diastolic dysfunction (pseudonormalization).   2. Right ventricular systolic function is normal. The right ventricular  size is normal. There is normal pulmonary artery systolic pressure.   3. The mitral valve is normal in structure. Trivial mitral valve  regurgitation. No evidence of mitral stenosis.   4. The aortic valve is tricuspid. There is mild thickening of the aortic  valve. Aortic valve regurgitation is not visualized.     Neuro/Psych TIACVA  negative psych ROS   GI/Hepatic Neg liver ROS,GERD  ,,  Endo/Other  Hypothyroidism    Renal/GU CRFRenal disease  negative genitourinary   Musculoskeletal  (+) Arthritis ,    Abdominal Normal abdominal exam  (+)   Peds  Hematology  (+) Blood dyscrasia, anemia   Anesthesia Other Findings   Reproductive/Obstetrics                             Anesthesia Physical Anesthesia Plan  ASA: 3  Anesthesia Plan: MAC   Post-op Pain Management:     Induction:   PONV Risk Score and Plan: 2 and Propofol infusion and Treatment may vary due to age or medical condition  Airway Management Planned: Simple Face Mask, Natural Airway and Nasal Cannula  Additional Equipment: None  Intra-op Plan:   Post-operative Plan:   Informed Consent: I have reviewed the patients History and Physical, chart, labs and discussed the procedure including the risks, benefits and alternatives for the proposed anesthesia with the patient or authorized representative who has indicated his/her understanding and acceptance.     Dental advisory given  Plan Discussed with: CRNA  Anesthesia Plan Comments:        Anesthesia Quick Evaluation

## 2022-07-08 NOTE — Progress Notes (Signed)
Pharmacy Antibiotic Note  [name] is a 82 y.o. female admitted on 07/02/2022 with sepsis and found to have to pan(S) E. Coli bacteremia treated with cefazolin. Today she was noted to have increased O2 requirement and elevated WBC. Pharmacy has been consulted to initiate Unasyn for aspiration PNA.  Plan: Start Unasyn 3g Q12h  Follow renal function, clinical progression and cultures to narrow therapy  Height: '5\' 3"'$  (160 cm) Weight: 69.3 kg (152 lb 12.5 oz) IBW/kg (Calculated) : 52.4  Temp (24hrs), Avg:97.3 F (36.3 C), Min:97.1 F (36.2 C), Max:97.6 F (36.4 C)  Recent Labs  Lab 07/03/22 0230 07/04/22 0709 07/05/22 0659 07/06/22 0720 07/07/22 0802 07/07/22 1530 07/07/22 2240 07/08/22 0553  WBC  --  20.1* 13.2* 13.1* 16.0* 13.0* 14.9* 14.6*  CREATININE  --  3.69* 3.32* 2.93* 2.89*  --   --  3.08*  LATICACIDVEN 1.5  --   --   --   --   --   --   --     Estimated Creatinine Clearance: 12.7 mL/min (A) (by C-G formula based on SCr of 3.08 mg/dL (H)).    Allergies  Allergen Reactions   Nsaids Other (See Comments)    Stomach upset Told to avoid due to Eliquis   Aspirin Other (See Comments)    Stomach upset Told to avoid due to Eliquis   Codeine Nausea And Vomiting   Nitrostat [Nitroglycerin] Other (See Comments)    Bradycardia. Drop in heart rate     Antimicrobials this admission: CTX 3/4 >> 3/6 Cefazolin 3/6 >> 3/9 Unasyn 3/9 >>  Dose adjustments this admission: N/a  Microbiology results: 3/3 COVID, flu and RSV: neg 3/3 Urine: > 100K/ml E coli - resistant to amp, >80K/ml  Klebsiella, resistant to amp only 3/4 blood: E coli - resistant to amp 3/4 BCID: E coli, no resistance noted  Thank you for allowing pharmacy to be a part of this patient's care.  Delilah Shan Blinda Turek 07/08/2022 11:46 AM

## 2022-07-08 NOTE — Transfer of Care (Signed)
Immediate Anesthesia Transfer of Care Note  Patient: [name]  Procedure(s) Performed: ESOPHAGOGASTRODUODENOSCOPY (EGD) WITH PROPOFOL BIOPSY  Patient Location: PACU  Anesthesia Type:MAC  Level of Consciousness: awake, alert , and patient cooperative  Airway & Oxygen Therapy: Patient Spontanous Breathing and Patient connected to face mask oxygen  Post-op Assessment: Report given to RN and Post -op Vital signs reviewed and stable  Post vital signs: Reviewed and stable  Last Vitals:  Vitals Value Taken Time  BP 154/100 07/08/22 0950  Temp    Pulse 95 07/08/22 0953  Resp 18 07/08/22 0953  SpO2 94 % 07/08/22 0953  Vitals shown include unvalidated device data.  Last Pain:  Vitals:   07/08/22 0950  TempSrc: Temporal  PainSc: 0-No pain      Patients Stated Pain Goal: 0 ([id number])  Complications: No notable events documented.

## 2022-07-08 NOTE — Op Note (Signed)
[facility] Patient Name: [name] Procedure Date : 07/08/2022 MRN: [id number] Attending MD: Carlota Raspberry. Havery Moros , MD, [id number] Date of Birth: [birth year] CSN: [id number] Age: 82 Admit Type: Inpatient Procedure:                Upper GI endoscopy Indications:              Dark stools concerning for melena, anemia. On                            Eliquis for history of PE October 2023 Providers:                Carlota Raspberry. Havery Moros, MD, Jeanella Cara,                            RN, Fransico Setters Mbumina, Technician Referring MD:              Medicines:                Monitored Anesthesia Care Complications:            No immediate complications. Estimated blood loss:                            Minimal. Estimated Blood Loss:     Estimated blood loss was minimal. Procedure:                Pre-Anesthesia Assessment:                           - Prior to the procedure, a History and Physical                            was performed, and patient medications and                            allergies were reviewed. The patient's tolerance of                            previous anesthesia was also reviewed. The risks                            and benefits of the procedure and the sedation                            options and risks were discussed with the patient.                            All questions were answered, and informed consent                            was obtained. Prior Anticoagulants: The patient has                            taken Eliquis (apixaban), last dose was 2 days  prior to procedure. ASA Grade Assessment: III - A                            patient with severe systemic disease. After                            reviewing the risks and benefits, the patient was                            deemed in satisfactory condition to undergo the                            procedure.                           After obtaining informed  consent, the endoscope was                            passed under direct vision. Throughout the                            procedure, the patient's blood pressure, pulse, and                            oxygen saturations were monitored continuously. The                            [id number] Olympus endoscope was introduced                            through the mouth, and advanced to the second part                            of duodenum. The upper GI endoscopy was                            accomplished without difficulty. The patient                            tolerated the procedure well. Scope In: Scope Out: Findings:      Esophagogastric landmarks were identified: the Z-line was found at 40       cm, the gastroesophageal junction was found at 40 cm and the upper       extent of the gastric folds was found at 44 cm from the incisors.      A 4 cm hiatal hernia was present.      LA Grade A esophagitis was found at the GEJ.      The exam of the esophagus was otherwise normal.      Four non-bleeding superficial gastric ulcers were found in the gastric       body. All clean based, the largest had some pigmented material but no       high risk stigmata. The largest lesion was 6 mm in largest dimension,       smallest was 65m or so.  Two non-bleeding superficial gastric ulcers with no stigmata of bleeding       were found in the gastric antrum. The largest lesion was 8 mm in largest       dimension, smallest was 3-65m.      Four non-bleeding superficial gastric ulcers with no stigmata of       bleeding were found in the prepyloric region of the stomach. The largest       lesion was 4 mm in largest dimension.      Mucosal changes were found in the entire examined stomach - mottled       colored, blotchy in appearance. Biopsies were taken with a cold forceps       for histology, rule out H pylori.      The exam of the stomach was otherwise normal.      One non-bleeding  superficial duodenal ulcer with pigmented material was       found in the duodenal bulb. The lesion was 12 mm in largest dimension.       No high risk stigmata appreciated.      The exam of the duodenum was otherwise normal. No blood noted anywhere       in the bowel, she has stopped bleeding. Impression:               - Esophagogastric landmarks identified.                           - 4 cm hiatal hernia.                           - LA Grade A esophagitis.                           - Normal esophagus otherwise.                           - 10 gastric ulcers as noted above - none with                            stigmata for bleeding                           - Mucosal changes in the stomach. Biopsied.                           - Non-bleeding duodenal ulcer with pigmented                            material.                           Ulcers are the likely cause for her dark stools /                            anemia in the setting of Eliquis. No high risk                            stigmata for bleeding noted, she has stopped with  holding Eliquis and PPI. Recommendation:           - Return patient to hospital ward for ongoing care.                           - Full liquid diet okay today, advance tomorrow as                            tolerated                           - Continue present medications.                           - Continue IV protonix '40mg'$  twice daily for now                           - Continue to hold Eliquis for now - consider                            resuming in upcoming days pending her course                           - Await pathology results - rule out H pylori.                            Treat if positive                           - Trend Hgb, monitor response to therapy                           - Call with questions Procedure Code(s):        --- Professional ---                           [phone], Esophagogastroduodenoscopy, flexible,                             transoral; with biopsy, single or multiple Diagnosis Code(s):        --- Professional ---                           K44.9, Diaphragmatic hernia without obstruction or                            gangrene                           K20.90, Esophagitis, unspecified without bleeding                           K25.9, Gastric ulcer, unspecified as acute or                            chronic, without hemorrhage or perforation  K31.89, Other diseases of stomach and duodenum                           K26.9, Duodenal ulcer, unspecified as acute or                            chronic, without hemorrhage or perforation                           K92.1, Melena (includes Hematochezia) CPT copyright 2022 [employer]. All rights reserved. The codes documented in this report are preliminary and upon coder review may  be revised to meet current compliance requirements. Remo Lipps P. Laurann Mcmorris, MD 07/08/2022 9:42:38 AM This report has been signed electronically. Number of Addenda: 0

## 2022-07-08 NOTE — Interval H&P Note (Signed)
History and Physical Interval Note: Patient drank Mag citrate overnight - having some diarrhea from that which is dark in color but loose / watery and not c/w melena. BUN continues to downtrend, Hgb has drifted but she has had multiple lab draws. Scheduled for EGD to clear her stomach / upper tract today. Last dose of Eliquis was on 3/7. I have discussed risks / benefits of EGD and anesthesia with her and she wishes to proceed. Further recommendations pending the results.    07/08/2022 8:55 AM  [name]  has presented today for surgery, with the diagnosis of melena.  The various methods of treatment have been discussed with the patient and family. After consideration of risks, benefits and other options for treatment, the patient has consented to  Procedure(s): ESOPHAGOGASTRODUODENOSCOPY (EGD) WITH PROPOFOL (N/A) as a surgical intervention.  The patient's history has been reviewed, patient examined, no change in status, stable for surgery.  I have reviewed the patient's chart and labs.  Questions were answered to the patient's satisfaction.     [place]

## 2022-07-08 NOTE — Progress Notes (Signed)
PROGRESS NOTE    [name]  [id number] DOB: 1940-07-13 DOA: 07/02/2022 PCP: Cassandria Anger, MD    Chief Complaint  Patient presents with   Extremity Weakness    Brief Narrative:  [name] is a 82 y.o. female with a history of CKD stage IV, hypertension, rheumatoid arthritis, hypothyroidism, CAD s/p CABG, COPD, hyperlipidemia, DVT/PE. Patient presented secondary to weakness and fever and is found to have evidence of sepsis with a likely urinary source. Empiric antibiotics started and cultures obtained. Patient also found to have hyponatremia with associated dehydration and AKI. IV fluids started. Blood cultures significant for E. Coli bacteremia. Renal function improving with IV fluids.  Workup significant for acute polynephritis/bacteremia, as well she did develop melena secondary to gastric/duodenal ulcers.    Assessment & Plan:   Principal Problem:   Acute pyelonephritis Active Problems:   Acute renal failure superimposed on stage 4 chronic kidney disease (HCC)   Acute metabolic encephalopathy   Hyponatremia   Sepsis with acute renal failure (HCC)   Acquired hypothyroidism   Essential hypertension   Rheumatoid arthritis (HCC)   Anticoagulated   Hx of CABG   Hyperlipidemia   COPD (chronic obstructive pulmonary disease) (HCC)   CKD (chronic kidney disease) stage 4, GFR 15-29 ml/min (HCC) - baseline SCr 2.5. follows with Dr. Royce Macadamia with [facility]   History of pulmonary embolism   Melena   Metabolic acidosis, increased anion gap   Generalized weakness   DNR (do not resuscitate)/DNI(Do Not Intubate)   E coli bacteremia   Dark stools   Anemia   Acute gastric ulcer with hemorrhage   Duodenal ulcer   Sepsis due to acute hemorrhagic cystitis /right pyelonephritis with E. coli bacteremia Sepsis present on admission - Patient with non-specific symptoms but with associated fever and leukocytosis.  She was noted to have significant right flank pain/CVA  tenderness on exam . Urinalysis suggests infection, confirmed on urine culture. Patient started empirically on Ceftriaxone IV. Associated E. Coli bacteremia -on IV  Ceftriaxone 2 g IV, narrowed to IV cefazolin, now on IV Unasyn.   -Follow-up blood and urine culture data  Acute blood loss anemia Acute upper GI bleed due to gastric ulcer/duodenal ulcers -Developed melena 3/8, Eliquis has been held, GI input greatly appreciated, went for endoscopy being significant for gastric and duodenal ulcer, no evidence of active bleed, recommendation to hold anticoagulation for few days and for Protonix twice daily.  Continue to monitor CBC closely.   Sepsis with acute renal failure ([place]) Present on admission. Associated fever and leukocytosis with WBC of 23,800 on admission. Urinary source with associated E. Coli bacteremia.   Hyponatremia Sodium of 124 on admission. Likely related to dehydration and hypovolemia, present on admission. Patient started on IV fluids with initial improvement in sodium to 135.   Acute metabolic encephalopathy Appears to be improved today with IV fluids. Likely related to overall illness but more specifically dehydration and hyponatremia. Patient is from independent living.   Acute renal failure superimposed on stage 4 chronic kidney disease (HCC) - baseline SCr 2.5. follows with Dr. Royce Macadamia with [facility] Creatinine of 4.70 on admission secondary to poor oral intake in addition to patient continuing diuretic use. Creatinine improved to 3.69 with IV fluids -Transition off renal diet since having continued improvement of renal function -Daily BMP   Essential hypertension Patient is managed on Coreg as an outpatient. Uncontrolled while admitted. -Pressure remains uncontrolled despite increasing Coreg . -Remains uncontrolled, discussed with husband at  bedside, he reports overall her blood pressure has been acceptable to normal at home. -Blood pressure remains significantly  elevated despite adding Norvasc, did add scheduled hydralazine as well. -Continue with as needed hydralazine  Hypokalemia -Repleted  History of pulmonary embolism -Will hold Eliquis today given she developed melena.   COPD (chronic obstructive pulmonary disease) (HCC) Stable. Asymptomatic.   Hyperlipidemia -Continue Lipitor daily   Hx of CABG Noted. No chest pain.   Long term (current) use of anticoagulants Noted. Patient is on Eliquis as an outpatient for the indication of PE/DVT which does not qualify for dose reduction, however patient is on reduced dose of 2.5 mg BID as an outpatient; possibly related to history of GI bleeding.   Rheumatoid arthritis ([place]) Patient is on Traver as an outpatient which is held secondary to active infection.   Acquired hypothyroidism -Continue Synthroid 88 mcg daily   Generalized weakness Secondary to acute illness. PT/OT recommending SNF. Hoping patient will improve in functional capacity prior to discharge. Recommend discontinuation of Purewick during the day. Patient encouraged to work with nursing for transfer to commode for toileting. Also recommended to have patient up out of bed or sitting up during the day. Patient motivated to not discharge to rehab.   Metabolic acidosis, increased anion gap Secondary to AKI. Expect this to improve. -If acidosis worsens, start sodium bicarbonate tablets  Acute hypoxic respiratory failure Pneumonia -Hypoxic 8788% on room air, currently on 2 L oxygen, evidence of early evolving opacity in right lung base, she was encouraged to use incentive spirometer, wean oxygen as tolerated, started on IV Unasyn.     DVT prophylaxis: Eliquis Code Status: DNR Family Communication: D/W Husband  at bedside Disposition:   Status is: Inpatient    Consultants:  none   Subjective:  She reports generalized weakness and fatigue, she did develop diarrhea yesterday after receiving mag  sulfate.  Objective: Vitals:   07/08/22 1010 07/08/22 1020 07/08/22 1030 07/08/22 1040  BP: (!) 161/90     Pulse: 92 91 88 85  Resp: (!) 38 (!) 30 (!) 28 (!) 22  Temp:      TempSrc:      SpO2: 93% 92% 95% 92%  Weight:      Height:        Intake/Output Summary (Last 24 hours) at 07/08/2022 1145 Last data filed at 07/08/2022 0946 Gross per 24 hour  Intake 1268.22 ml  Output --  Net 1268.22 ml   Filed Weights   07/07/22 0349 07/08/22 0423 07/08/22 0835  Weight: 69.3 kg 69.3 kg 69.3 kg    Examination:  Awake Alert, Oriented X 3, No new F.N deficits, Normal affect Symmetrical Chest wall movement, bibasilar Rales RRR,No Gallops,Rubs or new Murmurs, No Parasternal Heave +ve B.Sounds, Abd Soft, No tenderness, No rebound - guarding or rigidity. No Cyanosis, Clubbing, +1 edema, No new Rash or bruise         Data Reviewed: I have personally reviewed following labs and imaging studies  CBC: Recent Labs  Lab 07/02/22 2329 07/02/22 2337 07/04/22 0709 07/05/22 0659 07/06/22 0720 07/07/22 0802 07/07/22 1530 07/07/22 2240 07/08/22 0553  WBC 23.8*  --  20.1*   < > 13.1* 16.0* 13.0* 14.9* 14.6*  NEUTROABS 20.1*  --  16.2*  --   --   --   --   --   --   HGB 11.8*   < > 11.0*   < > 10.2* 10.2* 10.2* 8.7* 9.1*  HCT 33.0*   < >  31.4*   < > 29.1* 30.4* 30.6* 25.4* 27.0*  MCV 89.2  --  89.0   < > 89.8 91.6 91.9 91.0 91.5  PLT 198  --  204   < > 246 283 280 269 275   < > = values in this interval not displayed.    Basic Metabolic Panel: Recent Labs  Lab 07/04/22 0709 07/05/22 0659 07/06/22 0720 07/07/22 0802 07/08/22 0553  NA 134* 135 133* 137 138  K 3.5 3.6 3.2* 4.3 4.2  CL 100 99 103 107 109  CO2 18* 19* 19* 18* 15*  GLUCOSE 78 107* 116* 120* 106*  BUN 82* 80* 70* 65* 57*  CREATININE 3.69* 3.32* 2.93* 2.89* 3.08*  CALCIUM 8.0* 8.2* 8.4* 9.3 9.2  MG 1.8  --   --   --   --     GFR: Estimated Creatinine Clearance: 12.7 mL/min (A) (by C-G formula based on SCr of  3.08 mg/dL (H)).  Liver Function Tests: Recent Labs  Lab 07/03/22 0033 07/04/22 0709  AST 38 25  ALT 20 17  ALKPHOS 95 93  BILITOT 1.0 0.7  PROT 5.4* 5.4*  ALBUMIN 2.5* 2.2*    CBG: Recent Labs  Lab 07/03/22 0632  GLUCAP 136*     Recent Results (from the past 240 hour(s))  Resp panel by RT-PCR (RSV, Flu A&B, Covid) Peripheral     Status: None   Collection Time: 07/02/22 11:00 PM   Specimen: Peripheral; Nasal Swab  Result Value Ref Range Status   SARS Coronavirus 2 by RT PCR NEGATIVE NEGATIVE Final   Influenza A by PCR NEGATIVE NEGATIVE Final   Influenza B by PCR NEGATIVE NEGATIVE Final    Comment: (NOTE) The Xpert Xpress SARS-CoV-2/FLU/RSV plus assay is intended as an aid in the diagnosis of influenza from Nasopharyngeal swab specimens and should not be used as a sole basis for treatment. Nasal washings and aspirates are unacceptable for Xpert Xpress SARS-CoV-2/FLU/RSV testing.  Fact Sheet for Patients: EntrepreneurPulse.com.au  Fact Sheet for Healthcare Providers: IncredibleEmployment.be  This test is not yet approved or cleared by the [place] FDA and has been authorized for detection and/or diagnosis of SARS-CoV-2 by FDA under an Emergency Use Authorization (EUA). This EUA will remain in effect (meaning this test can be used) for the duration of the COVID-19 declaration under Section 564(b)(1) of the Act, 21 U.S.C. section 360bbb-3(b)(1), unless the authorization is terminated or revoked.     Resp Syncytial Virus by PCR NEGATIVE NEGATIVE Final    Comment: (NOTE) Fact Sheet for Patients: EntrepreneurPulse.com.au  Fact Sheet for Healthcare Providers: IncredibleEmployment.be  This test is not yet approved or cleared by the [place] FDA and has been authorized for detection and/or diagnosis of SARS-CoV-2 by FDA under an Emergency Use Authorization (EUA). This EUA will  remain in effect (meaning this test can be used) for the duration of the COVID-19 declaration under Section 564(b)(1) of the Act, 21 U.S.C. section 360bbb-3(b)(1), unless the authorization is terminated or revoked.  Performed at [facility], [place] [address], [place], [place] [address]   Blood Culture (routine x 2)     Status: Abnormal   Collection Time: 07/02/22 11:00 PM   Specimen: BLOOD  Result Value Ref Range Status   Specimen Description BLOOD RIGHT ARM  Final   Special Requests   Final    BOTTLES DRAWN AEROBIC AND ANAEROBIC Blood Culture adequate volume   Culture  Setup Time   Final    GRAM  NEGATIVE RODS IN BOTH AEROBIC AND ANAEROBIC BOTTLES CRITICAL RESULT CALLED TO, READ BACK BY AND VERIFIED WITH: CATHY PIERCE ON 07/03/22 @ 1520 BY DRT Performed at [facility], [place] [address], [place], [place] [address]    Culture ESCHERICHIA COLI (A)  Final   Report Status 07/05/2022 FINAL  Final   Organism ID, Bacteria ESCHERICHIA COLI  Final      Susceptibility   Escherichia coli - MIC*    AMPICILLIN >=32 RESISTANT Resistant     CEFEPIME <=0.12 SENSITIVE Sensitive     CEFTAZIDIME <=1 SENSITIVE Sensitive     CEFTRIAXONE <=0.25 SENSITIVE Sensitive     CIPROFLOXACIN <=0.25 SENSITIVE Sensitive     GENTAMICIN <=1 SENSITIVE Sensitive     IMIPENEM <=0.25 SENSITIVE Sensitive     TRIMETH/SULFA <=20 SENSITIVE Sensitive     AMPICILLIN/SULBACTAM 8 SENSITIVE Sensitive     PIP/TAZO <=4 SENSITIVE Sensitive     * ESCHERICHIA COLI  Blood Culture ID Panel (Reflexed)     Status: Abnormal   Collection Time: 07/02/22 11:00 PM  Result Value Ref Range Status   Enterococcus faecalis NOT DETECTED NOT DETECTED Final   Enterococcus Faecium NOT DETECTED NOT DETECTED Final   Listeria monocytogenes NOT DETECTED NOT DETECTED Final   Staphylococcus species NOT DETECTED NOT DETECTED Final   Staphylococcus aureus (BCID) NOT DETECTED NOT DETECTED Final   Staphylococcus epidermidis NOT DETECTED NOT  DETECTED Final   Staphylococcus lugdunensis NOT DETECTED NOT DETECTED Final   Streptococcus species NOT DETECTED NOT DETECTED Final   Streptococcus agalactiae NOT DETECTED NOT DETECTED Final   Streptococcus pneumoniae NOT DETECTED NOT DETECTED Final   Streptococcus pyogenes NOT DETECTED NOT DETECTED Final   A.calcoaceticus-baumannii NOT DETECTED NOT DETECTED Final   Bacteroides fragilis NOT DETECTED NOT DETECTED Final   Enterobacterales DETECTED (A) NOT DETECTED Final    Comment: Enterobacterales represent a large order of gram negative bacteria, not a single organism. CRITICAL RESULT CALLED TO, READ BACK BY AND VERIFIED WITH: CATHY PIERCE ON 07/03/22 @ 1520 BY DRT    Enterobacter cloacae complex NOT DETECTED NOT DETECTED Final   Escherichia coli DETECTED (A) NOT DETECTED Final    Comment: CRITICAL RESULT CALLED TO, READ BACK BY AND VERIFIED WITH: CATHY PIERCE ON 07/03/22 @ 1520 BY DRT    Klebsiella aerogenes NOT DETECTED NOT DETECTED Final   Klebsiella oxytoca NOT DETECTED NOT DETECTED Final   Klebsiella pneumoniae NOT DETECTED NOT DETECTED Final   Proteus species NOT DETECTED NOT DETECTED Final   Salmonella species NOT DETECTED NOT DETECTED Final   Serratia marcescens NOT DETECTED NOT DETECTED Final   Haemophilus influenzae NOT DETECTED NOT DETECTED Final   Neisseria meningitidis NOT DETECTED NOT DETECTED Final   Pseudomonas aeruginosa NOT DETECTED NOT DETECTED Final   Stenotrophomonas maltophilia NOT DETECTED NOT DETECTED Final   Candida albicans NOT DETECTED NOT DETECTED Final   Candida auris NOT DETECTED NOT DETECTED Final   Candida glabrata NOT DETECTED NOT DETECTED Final   Candida krusei NOT DETECTED NOT DETECTED Final   Candida parapsilosis NOT DETECTED NOT DETECTED Final   Candida tropicalis NOT DETECTED NOT DETECTED Final   Cryptococcus neoformans/gattii NOT DETECTED NOT DETECTED Final   CTX-M ESBL NOT DETECTED NOT DETECTED Final   Carbapenem resistance IMP NOT DETECTED NOT  DETECTED Final   Carbapenem resistance KPC NOT DETECTED NOT DETECTED Final   Carbapenem resistance NDM NOT DETECTED NOT DETECTED Final   Carbapenem resist OXA 48 LIKE NOT DETECTED NOT DETECTED Final   Carbapenem  resistance VIM NOT DETECTED NOT DETECTED Final    Comment: Performed at [facility], [place] [address] [place], [place] [address]  Urine Culture (for pregnant, neutropenic or urologic patients or patients with an indwelling urinary catheter)     Status: Abnormal   Collection Time: 07/02/22 11:29 PM   Specimen: Urine, Catheterized  Result Value Ref Range Status   Specimen Description URINE, CATHETERIZED  Final   Special Requests   Final    NONE Performed at [facility], [place] [address], [place], [place] [address]    Culture (A)  Final    >=100,000 COLONIES/mL ESCHERICHIA COLI 80,000 COLONIES/mL KLEBSIELLA PNEUMONIAE    Report Status 07/05/2022 FINAL  Final   Organism ID, Bacteria ESCHERICHIA COLI (A)  Final   Organism ID, Bacteria KLEBSIELLA PNEUMONIAE (A)  Final      Susceptibility   Escherichia coli - MIC*    AMPICILLIN >=32 RESISTANT Resistant     CEFAZOLIN <=4 SENSITIVE Sensitive     CEFEPIME <=0.12 SENSITIVE Sensitive     CEFTRIAXONE <=0.25 SENSITIVE Sensitive     CIPROFLOXACIN <=0.25 SENSITIVE Sensitive     GENTAMICIN <=1 SENSITIVE Sensitive     IMIPENEM <=0.25 SENSITIVE Sensitive     NITROFURANTOIN <=16 SENSITIVE Sensitive     TRIMETH/SULFA <=20 SENSITIVE Sensitive     AMPICILLIN/SULBACTAM 4 SENSITIVE Sensitive     PIP/TAZO <=4 SENSITIVE Sensitive     * >=100,000 COLONIES/mL ESCHERICHIA COLI   Klebsiella pneumoniae - MIC*    AMPICILLIN RESISTANT Resistant     CEFAZOLIN <=4 SENSITIVE Sensitive     CEFEPIME <=0.12 SENSITIVE Sensitive     CEFTRIAXONE <=0.25 SENSITIVE Sensitive     CIPROFLOXACIN <=0.25 SENSITIVE Sensitive     GENTAMICIN <=1 SENSITIVE Sensitive     IMIPENEM <=0.25 SENSITIVE Sensitive     NITROFURANTOIN 32 SENSITIVE Sensitive      TRIMETH/SULFA <=20 SENSITIVE Sensitive     AMPICILLIN/SULBACTAM <=2 SENSITIVE Sensitive     PIP/TAZO <=4 SENSITIVE Sensitive     * 80,000 COLONIES/mL KLEBSIELLA PNEUMONIAE  Blood Culture (routine x 2)     Status: Abnormal   Collection Time: 07/03/22  2:20 AM   Specimen: BLOOD  Result Value Ref Range Status   Specimen Description BLOOD LEFT ARM  Final   Special Requests   Final    BOTTLES DRAWN AEROBIC AND ANAEROBIC Blood Culture adequate volume   Culture  Setup Time   Final    GRAM NEGATIVE RODS IN BOTH AEROBIC AND ANAEROBIC BOTTLES CRITICAL VALUE NOTED.  VALUE IS CONSISTENT WITH PREVIOUSLY REPORTED AND CALLED VALUE.    Culture (A)  Final    ESCHERICHIA COLI SUSCEPTIBILITIES PERFORMED ON PREVIOUS CULTURE WITHIN THE LAST 5 DAYS. Performed at [facility], [place] [address] [place], [place] [address]    Report Status 07/05/2022 FINAL  Final         Radiology Studies: DG CHEST PORT 1 VIEW  Result Date: 07/08/2022 CLINICAL DATA:  Shortness of breath. EXAM: PORTABLE CHEST 1 VIEW COMPARISON:  07/02/2022 FINDINGS: Median sternotomy and CABG. The heart size is normal. There are small bilateral pleural effusions. There is new bibasilar patchy opacity. IMPRESSION: 1. Small bilateral pleural effusions. 2. New bibasilar patchy opacities and cardiomegaly. Findings are consistent with pulmonary edema or infectious cause. Electronically Signed   By: Nolon Nations M.D.   On: 07/08/2022 11:38        Scheduled Meds:  amLODipine  10 mg Oral Daily   atorvastatin  80 mg Oral Daily   carvedilol  6.25 mg Oral BID WC   levothyroxine  88 mcg Oral QAC breakfast   pantoprazole (PROTONIX) IV  40 mg Intravenous Q12H   triamcinolone  1 spray Each Nare Daily   Continuous Infusions:     LOS: 5 days      Phillips Climes, MD [facility]   To contact the attending provider between 7A-7P or the covering provider during after hours 7P-7A, please log into the web site [email or web address]  and access using universal  password for that web site. If you do not have the password, please call the hospital operator.  07/08/2022, 11:45 AM

## 2022-07-08 NOTE — Anesthesia Postprocedure Evaluation (Signed)
Anesthesia Post Note  Patient: [name]  Procedure(s) Performed: ESOPHAGOGASTRODUODENOSCOPY (EGD) WITH PROPOFOL BIOPSY     Patient location during evaluation: PACU Anesthesia Type: MAC Level of consciousness: awake and alert Pain management: pain level controlled Vital Signs Assessment: post-procedure vital signs reviewed and stable Respiratory status: spontaneous breathing, nonlabored ventilation, respiratory function stable and patient connected to nasal cannula oxygen Cardiovascular status: stable and blood pressure returned to baseline Postop Assessment: no apparent nausea or vomiting Anesthetic complications: no   No notable events documented.  Last Vitals:  Vitals:   07/08/22 1040 07/08/22 1243  BP:  (!) 144/57  Pulse: 85 82  Resp: (!) 22 18  Temp:  36.6 C  SpO2: 92%     Last Pain:  Vitals:   07/08/22 1243  TempSrc: Oral  PainSc:                  March Rummage Breianna Delfino

## 2022-07-09 DIAGNOSIS — G9341 Metabolic encephalopathy: Secondary | ICD-10-CM | POA: Diagnosis not present

## 2022-07-09 DIAGNOSIS — K250 Acute gastric ulcer with hemorrhage: Secondary | ICD-10-CM | POA: Diagnosis not present

## 2022-07-09 DIAGNOSIS — N10 Acute tubulo-interstitial nephritis: Secondary | ICD-10-CM | POA: Diagnosis not present

## 2022-07-09 DIAGNOSIS — N184 Chronic kidney disease, stage 4 (severe): Secondary | ICD-10-CM | POA: Diagnosis not present

## 2022-07-09 DIAGNOSIS — Z7901 Long term (current) use of anticoagulants: Secondary | ICD-10-CM | POA: Diagnosis not present

## 2022-07-09 DIAGNOSIS — N179 Acute kidney failure, unspecified: Secondary | ICD-10-CM | POA: Diagnosis not present

## 2022-07-09 LAB — BASIC METABOLIC PANEL
Anion gap: 10 (ref 5–15)
BUN: 55 mg/dL — ABNORMAL HIGH (ref 8–23)
CO2: 19 mmol/L — ABNORMAL LOW (ref 22–32)
Calcium: 9.1 mg/dL (ref 8.9–10.3)
Chloride: 108 mmol/L (ref 98–111)
Creatinine, Ser: 3.27 mg/dL — ABNORMAL HIGH (ref 0.44–1.00)
GFR, Estimated: 13 mL/min — ABNORMAL LOW (ref >60)
Glucose, Bld: 103 mg/dL — ABNORMAL HIGH (ref 70–99)
Potassium: 3.6 mmol/L (ref 3.5–5.1)
Sodium: 137 mmol/L (ref 135–145)

## 2022-07-09 LAB — CBC
HCT: 25.6 % — ABNORMAL LOW (ref 36.0–46.0)
Hemoglobin: 8.6 g/dL — ABNORMAL LOW (ref 12.0–15.0)
MCH: 30.8 pg (ref 26.0–34.0)
MCHC: 33.6 g/dL (ref 30.0–36.0)
MCV: 91.8 fL (ref 80.0–100.0)
Platelets: 297 10*3/uL (ref 150–400)
RBC: 2.79 MIL/uL — ABNORMAL LOW (ref 3.87–5.11)
RDW: 17.9 % — ABNORMAL HIGH (ref 11.5–15.5)
WBC: 15.7 10*3/uL — ABNORMAL HIGH (ref 4.0–10.5)
nRBC: 0.6 % — ABNORMAL HIGH (ref 0.0–0.2)

## 2022-07-09 MED ORDER — FUROSEMIDE 20 MG PO TABS
20.0000 mg | ORAL_TABLET | Freq: Once | ORAL | Status: AC
  Administered 2022-07-09: 20 mg via ORAL
  Filled 2022-07-09: qty 1

## 2022-07-09 MED ORDER — SODIUM BICARBONATE 650 MG PO TABS
650.0000 mg | ORAL_TABLET | Freq: Two times a day (BID) | ORAL | Status: DC
  Administered 2022-07-09 – 2022-07-17 (×17): 650 mg via ORAL
  Filled 2022-07-09 (×17): qty 1

## 2022-07-09 MED ORDER — POTASSIUM CHLORIDE CRYS ER 20 MEQ PO TBCR
40.0000 meq | EXTENDED_RELEASE_TABLET | Freq: Once | ORAL | Status: AC
  Administered 2022-07-09: 40 meq via ORAL
  Filled 2022-07-09: qty 2

## 2022-07-09 MED ORDER — RISAQUAD PO CAPS
1.0000 | ORAL_CAPSULE | Freq: Three times a day (TID) | ORAL | Status: DC
  Administered 2022-07-09 – 2022-07-25 (×50): 1 via ORAL
  Filled 2022-07-09 (×49): qty 1

## 2022-07-09 NOTE — Plan of Care (Signed)

## 2022-07-09 NOTE — Progress Notes (Signed)
Daily Progress Note  DOA: 07/02/2022 Hospital Day: 8 Chief Complaint:  GI bleed  ASSESSMENT 82 yo female with CKD stage IV, hypertension, rheumatoid arthritis, hypothyroidism, CAD s/p CABG, COPD, hyperlipidemia, DVT/PE . Admitted with the following:  Sepsis /  acute metabolic encephalopathy/ cystitis / E.coli bacteremia /  AKI on CKD.    Melena on Eliquis / anemia / multiple gastric ulcers and one duodenal ulcer. Baseline hgb ~ 12.  Hgb declined to 8.6 with GI bleed and IVF but overall stable overnight. Eliquis is on hold . EGD showing 10 gastric ulcers / mucosal changes of stomach and a non-bleeding duodenal ulcer with pigmented material.    PE , on Eliquis at home  Hx of adenomatous colon polyps.  Given her age and comorbidities she is no longer undergoing surveillance exams.     History of appendectomy for large precancerous polyp invading the appendix in 2017, s/p resection   PLAN  Await gastric biopsies Continue BID PPI On full liquids, will advance to solids  Subjective / New events last 24 hours: No further melena . No abdominal pain. Wants diet advancement   Imaging:   EGD 07/08/22 Esophagogastric landmarks identified. - 4 cm hiatal hernia. - LA Grade A esophagitis. - Normal esophagus otherwise. - 10 gastric ulcers as noted above - none with stigmata for bleeding - Mucosal changes in the stomach. Biopsied. - Non-bleeding duodenal ulcer with pigmented material.  Lab Results: Recent Labs    07/07/22 2240 07/08/22 0553 07/09/22 0345  WBC 14.9* 14.6* 15.7*  HGB 8.7* 9.1* 8.6*  HCT 25.4* 27.0* 25.6*  PLT 269 275 297   BMET Recent Labs    07/07/22 0802 07/08/22 0553 07/09/22 0345  NA 137 138 137  K 4.3 4.2 3.6  CL 107 109 108  CO2 18* 15* 19*  GLUCOSE 120* 106* 103*  BUN 65* 57* 55*  CREATININE 2.89* 3.08* 3.27*  CALCIUM 9.3 9.2 9.1   LFT No results for input(s): "PROT", "ALBUMIN", "AST", "ALT", "ALKPHOS", "BILITOT", "BILIDIR", "IBILI" in the last  72 hours. PT/INR No results for input(s): "LABPROT", "INR" in the last 72 hours.   Scheduled inpatient medications:   acidophilus  1 capsule Oral TID   amLODipine  10 mg Oral Daily   atorvastatin  80 mg Oral Daily   carvedilol  6.25 mg Oral BID WC   levothyroxine  88 mcg Oral QAC breakfast   pantoprazole (PROTONIX) IV  40 mg Intravenous Q12H   triamcinolone  1 spray Each Nare Daily   Continuous inpatient infusions:   ampicillin-sulbactam (UNASYN) IV 1.5 g (07/09/22 0917)   PRN inpatient medications: acetaminophen **OR** acetaminophen, hydrALAZINE, hydrALAZINE, ondansetron **OR** ondansetron (ZOFRAN) IV, polyethylene glycol, witch hazel-glycerin  Vital signs in last 24 hours: Temp:  [97.4 F (36.3 C)-97.9 F (36.6 C)] 97.8 F (36.6 C) (03/10 0400) Pulse Rate:  [82-94] 92 (03/10 0730) Resp:  [14-38] 18 (03/10 0730) BP: (127-186)/(54-100) 156/64 (03/10 0730) SpO2:  [92 %-95 %] 92 % (03/10 0730) Last BM Date : 07/08/22  Intake/Output Summary (Last 24 hours) at 07/09/2022 0935 Last data filed at 07/09/2022 0600 Gross per 24 hour  Intake 1349.86 ml  Output 1610 ml  Net -260.14 ml    Intake/Output from previous day: 03/09 0701 - 03/10 0700 In: 1449.9 [P.O.:850; I.V.:300; IV Piggyback:299.9] Out: 1610 [Urine:1360; Stool:250] Intake/Output this shift: No intake/output data recorded.   Physical Exam:  General: Alert female in NAD in bedside chair Heart:  Regular rate and rhythm.  Pulmonary: Normal respiratory effort Abdomen: Soft, distended with tympany, nontender. Normal bowel sounds. Extremities: BLE pitting edema.   Neurologic: Alert and oriented Psych: Pleasant. Cooperative. Insight appears normal.    Principal Problem:   Acute pyelonephritis Active Problems:   Acquired hypothyroidism   Essential hypertension   Rheumatoid arthritis (HCC)   Anticoagulated   Hx of CABG   Hyperlipidemia   COPD (chronic obstructive pulmonary disease) (HCC)   CKD (chronic kidney  disease) stage 4, GFR 15-29 ml/min (HCC) - baseline SCr 2.5. follows with Dr. Royce Macadamia with [facility]   Melena   Acute renal failure superimposed on stage 4 chronic kidney disease ([place])   Metabolic acidosis, increased anion gap   Generalized weakness   History of pulmonary embolism   Acute metabolic encephalopathy   Hyponatremia   Sepsis with acute renal failure ([place])   DNR (do not resuscitate)/DNI(Do Not Intubate)   E coli bacteremia   Dark stools   Anemia   Acute gastric ulcer with hemorrhage   Duodenal ulcer     LOS: 6 days   Tye Savoy ,NP 07/09/2022, 9:36 AM

## 2022-07-09 NOTE — Progress Notes (Signed)
PROGRESS NOTE    [name]  [id number] DOB: 28-Dec-1940 DOA: 07/02/2022 PCP: Cassandria Anger, MD    Chief Complaint  Patient presents with   Extremity Weakness    Brief Narrative:  [name] is a 82 y.o. female with a history of CKD stage IV, hypertension, rheumatoid arthritis, hypothyroidism, CAD s/p CABG, COPD, hyperlipidemia, DVT/PE. Patient presented secondary to weakness and fever and is found to have evidence of sepsis with a likely urinary source. Empiric antibiotics started and cultures obtained. Patient also found to have hyponatremia with associated dehydration and AKI. IV fluids started. Blood cultures significant for E. Coli bacteremia. Renal function improving with IV fluids.  Workup significant for acute polynephritis/bacteremia, as well she did develop melena secondary to gastric/duodenal ulcers.    Assessment & Plan:   Principal Problem:   Acute pyelonephritis Active Problems:   Acute renal failure superimposed on stage 4 chronic kidney disease (HCC)   Acute metabolic encephalopathy   Hyponatremia   Sepsis with acute renal failure (HCC)   Acquired hypothyroidism   Essential hypertension   Rheumatoid arthritis (HCC)   Anticoagulated   Hx of CABG   Hyperlipidemia   COPD (chronic obstructive pulmonary disease) (HCC)   CKD (chronic kidney disease) stage 4, GFR 15-29 ml/min (HCC) - baseline SCr 2.5. follows with Dr. Royce Macadamia with [facility]   History of pulmonary embolism   Melena   Metabolic acidosis, increased anion gap   Generalized weakness   DNR (do not resuscitate)/DNI(Do Not Intubate)   E coli bacteremia   Dark stools   Anemia   Acute gastric ulcer with hemorrhage   Duodenal ulcer   Sepsis due to acute hemorrhagic cystitis /right pyelonephritis with E. coli bacteremia Sepsis present on admission - Patient with non-specific symptoms but with associated fever and leukocytosis.  She was noted to have significant right flank pain/CVA  tenderness on exam . Urinalysis suggests infection, confirmed on urine culture. Patient started empirically on Ceftriaxone IV. Associated E. Coli bacteremia -on IV  Ceftriaxone 2 g IV, narrowed to IV cefazolin, now on IV Unasyn.    Acute blood loss anemia Acute upper GI bleed due to gastric ulcer/duodenal ulcers -Developed melena 3/8, Eliquis has been held, GI input greatly appreciated, went for endoscopy being significant for gastric and duodenal ulcer, no evidence of active bleed, recommendation to hold anticoagulation for few days and for Protonix twice daily.  Continue to monitor CBC closely. -advances  to soft diet today,     Hyponatremia Sodium of 124 on admission. Likely related to dehydration and hypovolemia, present on admission.  -improved with IV hydration.   Acute metabolic encephalopathy  Likely related to overall illness but more specifically dehydration and hyponatremia. Patient is from independent living. -This has resolved, mentation back to baseline   Acute renal failure superimposed on stage 4 chronic kidney disease (HCC) Metabolic acidosis, increased anion gap - baseline SCr 2.5. follows with Dr. Royce Macadamia with [facility] Creatinine of 4.70 on admission secondary to poor oral intake in addition to patient continuing diuretic use.  -Improved with IV hydration, resume back on diuresis.   -Bicarb level, will start on sodium bicarb   Essential hypertension Patient is managed on Coreg as an outpatient. Uncontrolled while admitted. -Pressure remains uncontrolled despite increasing Coreg . -Remains uncontrolled, discussed with husband at bedside, he reports overall her blood pressure has been acceptable to normal at home. -Blood pressure remains significantly elevated despite adding Norvasc, did add scheduled hydralazine as well. -Continue with  as needed hydralazine  Hypokalemia -Repleted  History of pulmonary embolism -Eliquis on hold due to GI bleed, await GI  recommendation when it is safe to resume.   COPD (chronic obstructive pulmonary disease) (HCC) Stable. Asymptomatic.   Hyperlipidemia -Continue Lipitor daily   Hx of CABG Noted. No chest pain.   Long term (current) use of anticoagulants Noted. Patient is on Eliquis as an outpatient for the indication of PE/DVT which does not qualify for dose reduction, however patient is on reduced dose of 2.5 mg BID as an outpatient; possibly related to history of GI bleeding.   Rheumatoid arthritis ([place]) Patient is on [place] as an outpatient which is held secondary to active infection.   Acquired hypothyroidism -Continue Synthroid 88 mcg daily   Generalized weakness Secondary to acute illness. PT/OT recommending SNF. Hoping patient will improve in functional capacity prior to discharge. Recommend discontinuation of Purewick during the day. Patient encouraged to work with nursing for transfer to commode for toileting. Also recommended to have patient up out of bed or sitting up during the day. Patient motivated to not discharge to rehab.  Acute hypoxic respiratory failure Pneumonia -yesterday she was hypoxic 87% on room air, requiring 2 L oxygen,  evidence of early evolving opacity in right lung base, she was encouraged to use incentive spirometer, wean oxygen as tolerated, started on IV Unasyn. -This is much improved today with IV diuresis and IV Unasyn, she is on room air.     DVT prophylaxis: Eliquis Code Status: DNR Family Communication: D/W daughter   at bedside Disposition: SNF placment when anemia.GI bleed has stabilized  Status is: Inpatient    Consultants:  GI   Subjective:  Reports she is feeling better today, no nausea, no vomiting, no dyspnea.  Objective: Vitals:   07/08/22 1900 07/08/22 2300 07/09/22 0400 07/09/22 0730  BP:  128/63 (!) 141/54 (!) 156/64  Pulse:  84 87 92  Resp:  '17 20 18  '$ Temp: (!) 97.5 F (36.4 C) 97.6 F (36.4 C) 97.8 F (36.6 C)   TempSrc: Oral  Oral Oral   SpO2:  92% 92% 92%  Weight:      Height:        Intake/Output Summary (Last 24 hours) at 07/09/2022 1025 Last data filed at 07/09/2022 0600 Gross per 24 hour  Intake 1149.86 ml  Output 1610 ml  Net -460.14 ml   Filed Weights   07/07/22 0349 07/08/22 0423 07/08/22 0835  Weight: 69.3 kg 69.3 kg 69.3 kg    Examination:  Awake Alert, Oriented X 3, No new F.N deficits, Normal affect Symmetrical Chest wall movement, Good air movement bilaterally, left lung base Rales RRR,No Gallops,Rubs or new Murmurs, No Parasternal Heave +ve B.Sounds, Abd Soft, No tenderness, No rebound - guarding or rigidity. No Cyanosis, Clubbing ,trace  edema, No new Rash or bruise          Data Reviewed: I have personally reviewed following labs and imaging studies  CBC: Recent Labs  Lab 07/02/22 2329 07/02/22 2337 07/04/22 0709 07/05/22 0659 07/07/22 0802 07/07/22 1530 07/07/22 2240 07/08/22 0553 07/09/22 0345  WBC 23.8*  --  20.1*   < > 16.0* 13.0* 14.9* 14.6* 15.7*  NEUTROABS 20.1*  --  16.2*  --   --   --   --   --   --   HGB 11.8*   < > 11.0*   < > 10.2* 10.2* 8.7* 9.1* 8.6*  HCT 33.0*   < > 31.4*   < >  30.4* 30.6* 25.4* 27.0* 25.6*  MCV 89.2  --  89.0   < > 91.6 91.9 91.0 91.5 91.8  PLT 198  --  204   < > 283 280 269 275 297   < > = values in this interval not displayed.    Basic Metabolic Panel: Recent Labs  Lab 07/04/22 0709 07/05/22 0659 07/06/22 0720 07/07/22 0802 07/08/22 0553 07/09/22 0345  NA 134* 135 133* 137 138 137  K 3.5 3.6 3.2* 4.3 4.2 3.6  CL 100 99 103 107 109 108  CO2 18* 19* 19* 18* 15* 19*  GLUCOSE 78 107* 116* 120* 106* 103*  BUN 82* 80* 70* 65* 57* 55*  CREATININE 3.69* 3.32* 2.93* 2.89* 3.08* 3.27*  CALCIUM 8.0* 8.2* 8.4* 9.3 9.2 9.1  MG 1.8  --   --   --   --   --     GFR: Estimated Creatinine Clearance: 12 mL/min (A) (by C-G formula based on SCr of 3.27 mg/dL (H)).  Liver Function Tests: Recent Labs  Lab 07/03/22 0033  07/04/22 0709  AST 38 25  ALT 20 17  ALKPHOS 95 93  BILITOT 1.0 0.7  PROT 5.4* 5.4*  ALBUMIN 2.5* 2.2*    CBG: Recent Labs  Lab 07/03/22 0632  GLUCAP 136*     Recent Results (from the past 240 hour(s))  Resp panel by RT-PCR (RSV, Flu A&B, Covid) Peripheral     Status: None   Collection Time: 07/02/22 11:00 PM   Specimen: Peripheral; Nasal Swab  Result Value Ref Range Status   SARS Coronavirus 2 by RT PCR NEGATIVE NEGATIVE Final   Influenza A by PCR NEGATIVE NEGATIVE Final   Influenza B by PCR NEGATIVE NEGATIVE Final    Comment: (NOTE) The Xpert Xpress SARS-CoV-2/FLU/RSV plus assay is intended as an aid in the diagnosis of influenza from Nasopharyngeal swab specimens and should not be used as a sole basis for treatment. Nasal washings and aspirates are unacceptable for Xpert Xpress SARS-CoV-2/FLU/RSV testing.  Fact Sheet for Patients: EntrepreneurPulse.com.au  Fact Sheet for Healthcare Providers: IncredibleEmployment.be  This test is not yet approved or cleared by the [place] FDA and has been authorized for detection and/or diagnosis of SARS-CoV-2 by FDA under an Emergency Use Authorization (EUA). This EUA will remain in effect (meaning this test can be used) for the duration of the COVID-19 declaration under Section 564(b)(1) of the Act, 21 U.S.C. section 360bbb-3(b)(1), unless the authorization is terminated or revoked.     Resp Syncytial Virus by PCR NEGATIVE NEGATIVE Final    Comment: (NOTE) Fact Sheet for Patients: EntrepreneurPulse.com.au  Fact Sheet for Healthcare Providers: IncredibleEmployment.be  This test is not yet approved or cleared by the [place] FDA and has been authorized for detection and/or diagnosis of SARS-CoV-2 by FDA under an Emergency Use Authorization (EUA). This EUA will remain in effect (meaning this test can be used) for the duration of the COVID-19  declaration under Section 564(b)(1) of the Act, 21 U.S.C. section 360bbb-3(b)(1), unless the authorization is terminated or revoked.  Performed at [facility], [place] [address] [place], [place] [address]   Blood Culture (routine x 2)     Status: Abnormal   Collection Time: 07/02/22 11:00 PM   Specimen: BLOOD  Result Value Ref Range Status   Specimen Description BLOOD RIGHT ARM  Final   Special Requests   Final    BOTTLES DRAWN AEROBIC AND ANAEROBIC Blood Culture adequate volume   Culture  Setup  Time   Final    GRAM NEGATIVE RODS IN BOTH AEROBIC AND ANAEROBIC BOTTLES CRITICAL RESULT CALLED TO, READ BACK BY AND VERIFIED WITH: CATHY PIERCE ON 07/03/22 @ 1520 BY DRT Performed at [facility], [place] [address], [place], [place] [address]    Culture ESCHERICHIA COLI (A)  Final   Report Status 07/05/2022 FINAL  Final   Organism ID, Bacteria ESCHERICHIA COLI  Final      Susceptibility   Escherichia coli - MIC*    AMPICILLIN >=32 RESISTANT Resistant     CEFEPIME <=0.12 SENSITIVE Sensitive     CEFTAZIDIME <=1 SENSITIVE Sensitive     CEFTRIAXONE <=0.25 SENSITIVE Sensitive     CIPROFLOXACIN <=0.25 SENSITIVE Sensitive     GENTAMICIN <=1 SENSITIVE Sensitive     IMIPENEM <=0.25 SENSITIVE Sensitive     TRIMETH/SULFA <=20 SENSITIVE Sensitive     AMPICILLIN/SULBACTAM 8 SENSITIVE Sensitive     PIP/TAZO <=4 SENSITIVE Sensitive     * ESCHERICHIA COLI  Blood Culture ID Panel (Reflexed)     Status: Abnormal   Collection Time: 07/02/22 11:00 PM  Result Value Ref Range Status   Enterococcus faecalis NOT DETECTED NOT DETECTED Final   Enterococcus Faecium NOT DETECTED NOT DETECTED Final   Listeria monocytogenes NOT DETECTED NOT DETECTED Final   Staphylococcus species NOT DETECTED NOT DETECTED Final   Staphylococcus aureus (BCID) NOT DETECTED NOT DETECTED Final   Staphylococcus epidermidis NOT DETECTED NOT DETECTED Final   Staphylococcus lugdunensis NOT DETECTED NOT DETECTED Final    Streptococcus species NOT DETECTED NOT DETECTED Final   Streptococcus agalactiae NOT DETECTED NOT DETECTED Final   Streptococcus pneumoniae NOT DETECTED NOT DETECTED Final   Streptococcus pyogenes NOT DETECTED NOT DETECTED Final   A.calcoaceticus-baumannii NOT DETECTED NOT DETECTED Final   Bacteroides fragilis NOT DETECTED NOT DETECTED Final   Enterobacterales DETECTED (A) NOT DETECTED Final    Comment: Enterobacterales represent a large order of gram negative bacteria, not a single organism. CRITICAL RESULT CALLED TO, READ BACK BY AND VERIFIED WITH: CATHY PIERCE ON 07/03/22 @ 1520 BY DRT    Enterobacter cloacae complex NOT DETECTED NOT DETECTED Final   Escherichia coli DETECTED (A) NOT DETECTED Final    Comment: CRITICAL RESULT CALLED TO, READ BACK BY AND VERIFIED WITH: CATHY PIERCE ON 07/03/22 @ 1520 BY DRT    Klebsiella aerogenes NOT DETECTED NOT DETECTED Final   Klebsiella oxytoca NOT DETECTED NOT DETECTED Final   Klebsiella pneumoniae NOT DETECTED NOT DETECTED Final   Proteus species NOT DETECTED NOT DETECTED Final   Salmonella species NOT DETECTED NOT DETECTED Final   Serratia marcescens NOT DETECTED NOT DETECTED Final   Haemophilus influenzae NOT DETECTED NOT DETECTED Final   Neisseria meningitidis NOT DETECTED NOT DETECTED Final   Pseudomonas aeruginosa NOT DETECTED NOT DETECTED Final   Stenotrophomonas maltophilia NOT DETECTED NOT DETECTED Final   Candida albicans NOT DETECTED NOT DETECTED Final   Candida auris NOT DETECTED NOT DETECTED Final   Candida glabrata NOT DETECTED NOT DETECTED Final   Candida krusei NOT DETECTED NOT DETECTED Final   Candida parapsilosis NOT DETECTED NOT DETECTED Final   Candida tropicalis NOT DETECTED NOT DETECTED Final   Cryptococcus neoformans/gattii NOT DETECTED NOT DETECTED Final   CTX-M ESBL NOT DETECTED NOT DETECTED Final   Carbapenem resistance IMP NOT DETECTED NOT DETECTED Final   Carbapenem resistance KPC NOT DETECTED NOT DETECTED Final    Carbapenem resistance NDM NOT DETECTED NOT DETECTED Final   Carbapenem resist OXA 48 LIKE  NOT DETECTED NOT DETECTED Final   Carbapenem resistance VIM NOT DETECTED NOT DETECTED Final    Comment: Performed at [facility], [place] [address], [place], [place] [address]  Urine Culture (for pregnant, neutropenic or urologic patients or patients with an indwelling urinary catheter)     Status: Abnormal   Collection Time: 07/02/22 11:29 PM   Specimen: Urine, Catheterized  Result Value Ref Range Status   Specimen Description URINE, CATHETERIZED  Final   Special Requests   Final    NONE Performed at [facility], [place] [address] [place], [place] [address]    Culture (A)  Final    >=100,000 COLONIES/mL ESCHERICHIA COLI 80,000 COLONIES/mL KLEBSIELLA PNEUMONIAE    Report Status 07/05/2022 FINAL  Final   Organism ID, Bacteria ESCHERICHIA COLI (A)  Final   Organism ID, Bacteria KLEBSIELLA PNEUMONIAE (A)  Final      Susceptibility   Escherichia coli - MIC*    AMPICILLIN >=32 RESISTANT Resistant     CEFAZOLIN <=4 SENSITIVE Sensitive     CEFEPIME <=0.12 SENSITIVE Sensitive     CEFTRIAXONE <=0.25 SENSITIVE Sensitive     CIPROFLOXACIN <=0.25 SENSITIVE Sensitive     GENTAMICIN <=1 SENSITIVE Sensitive     IMIPENEM <=0.25 SENSITIVE Sensitive     NITROFURANTOIN <=16 SENSITIVE Sensitive     TRIMETH/SULFA <=20 SENSITIVE Sensitive     AMPICILLIN/SULBACTAM 4 SENSITIVE Sensitive     PIP/TAZO <=4 SENSITIVE Sensitive     * >=100,000 COLONIES/mL ESCHERICHIA COLI   Klebsiella pneumoniae - MIC*    AMPICILLIN RESISTANT Resistant     CEFAZOLIN <=4 SENSITIVE Sensitive     CEFEPIME <=0.12 SENSITIVE Sensitive     CEFTRIAXONE <=0.25 SENSITIVE Sensitive     CIPROFLOXACIN <=0.25 SENSITIVE Sensitive     GENTAMICIN <=1 SENSITIVE Sensitive     IMIPENEM <=0.25 SENSITIVE Sensitive     NITROFURANTOIN 32 SENSITIVE Sensitive     TRIMETH/SULFA <=20 SENSITIVE Sensitive     AMPICILLIN/SULBACTAM <=2 SENSITIVE  Sensitive     PIP/TAZO <=4 SENSITIVE Sensitive     * 80,000 COLONIES/mL KLEBSIELLA PNEUMONIAE  Blood Culture (routine x 2)     Status: Abnormal   Collection Time: 07/03/22  2:20 AM   Specimen: BLOOD  Result Value Ref Range Status   Specimen Description BLOOD LEFT ARM  Final   Special Requests   Final    BOTTLES DRAWN AEROBIC AND ANAEROBIC Blood Culture adequate volume   Culture  Setup Time   Final    GRAM NEGATIVE RODS IN BOTH AEROBIC AND ANAEROBIC BOTTLES CRITICAL VALUE NOTED.  VALUE IS CONSISTENT WITH PREVIOUSLY REPORTED AND CALLED VALUE.    Culture (A)  Final    ESCHERICHIA COLI SUSCEPTIBILITIES PERFORMED ON PREVIOUS CULTURE WITHIN THE LAST 5 DAYS. Performed at [facility], [place] [address], [place], [place] [address]    Report Status 07/05/2022 FINAL  Final         Radiology Studies: DG CHEST PORT 1 VIEW  Result Date: 07/08/2022 CLINICAL DATA:  Shortness of breath. EXAM: PORTABLE CHEST 1 VIEW COMPARISON:  07/02/2022 FINDINGS: Median sternotomy and CABG. The heart size is normal. There are small bilateral pleural effusions. There is new bibasilar patchy opacity. IMPRESSION: 1. Small bilateral pleural effusions. 2. New bibasilar patchy opacities and cardiomegaly. Findings are consistent with pulmonary edema or infectious cause. Electronically Signed   By: Nolon Nations M.D.   On: 07/08/2022 11:38        Scheduled Meds:  acidophilus  1 capsule Oral TID   amLODipine  10 mg Oral Daily   atorvastatin  80 mg Oral Daily   carvedilol  6.25 mg Oral BID WC   furosemide  20 mg Oral Once   levothyroxine  88 mcg Oral QAC breakfast   pantoprazole (PROTONIX) IV  40 mg Intravenous Q12H   potassium chloride  40 mEq Oral Once   sodium bicarbonate  650 mg Oral BID   triamcinolone  1 spray Each Nare Daily   Continuous Infusions:  ampicillin-sulbactam (UNASYN) IV 1.5 g (07/09/22 0917)      LOS: 6 days      Phillips Climes, MD [facility]   To contact the  attending provider between 7A-7P or the covering provider during after hours 7P-7A, please log into the web site [email or web address] and access using universal [place] password for that web site. If you do not have the password, please call the hospital operator.  07/09/2022, 10:25 AM

## 2022-07-10 DIAGNOSIS — N10 Acute tubulo-interstitial nephritis: Secondary | ICD-10-CM | POA: Diagnosis not present

## 2022-07-10 LAB — CBC
HCT: 25.9 % — ABNORMAL LOW (ref 36.0–46.0)
Hemoglobin: 8.7 g/dL — ABNORMAL LOW (ref 12.0–15.0)
MCH: 30.9 pg (ref 26.0–34.0)
MCHC: 33.6 g/dL (ref 30.0–36.0)
MCV: 91.8 fL (ref 80.0–100.0)
Platelets: 337 10*3/uL (ref 150–400)
RBC: 2.82 MIL/uL — ABNORMAL LOW (ref 3.87–5.11)
RDW: 18.1 % — ABNORMAL HIGH (ref 11.5–15.5)
WBC: 17.4 10*3/uL — ABNORMAL HIGH (ref 4.0–10.5)
nRBC: 0.6 % — ABNORMAL HIGH (ref 0.0–0.2)

## 2022-07-10 LAB — BASIC METABOLIC PANEL
Anion gap: 12 (ref 5–15)
BUN: 55 mg/dL — ABNORMAL HIGH (ref 8–23)
CO2: 19 mmol/L — ABNORMAL LOW (ref 22–32)
Calcium: 9.2 mg/dL (ref 8.9–10.3)
Chloride: 105 mmol/L (ref 98–111)
Creatinine, Ser: 3.48 mg/dL — ABNORMAL HIGH (ref 0.44–1.00)
GFR, Estimated: 12 mL/min — ABNORMAL LOW (ref >60)
Glucose, Bld: 103 mg/dL — ABNORMAL HIGH (ref 70–99)
Potassium: 3.7 mmol/L (ref 3.5–5.1)
Sodium: 136 mmol/L (ref 135–145)

## 2022-07-10 MED ORDER — SODIUM CHLORIDE 0.9 % IV SOLN: INTRAVENOUS | Status: DC

## 2022-07-10 NOTE — Plan of Care (Signed)

## 2022-07-10 NOTE — Progress Notes (Signed)
Occupational Therapy Treatment Patient Details Name: [name] MRN: [id number] DOB: January 16, 1941 Today's Date: 07/10/2022   History of present illness 82 y.o. female presents to [facility] hospital on 07/02/2022 with worsening weakness. Pt admitted for management of acute cystitis. PMH includes CKD IV, HTN, RA, hypothyroidism, CABG, COPD, HLD, PE.   OT comments  Pt progressing steadily, standing from chair with min guard assist and from toilet with min assist, ambulating with RW and supervision for lines/safety. Pt needing min assist for LB dressing and total assist for posterior pericare. Pt stood at sink to wash hands with supervision. Will need education in use of toilet tongs next visit. Updated d/c recommendation to [facility].   Recommendations for follow up therapy are one component of a multi-disciplinary discharge planning process, led by the attending physician.  Recommendations may be updated based on patient status, additional functional criteria and insurance authorization.    Follow Up Recommendations  Home health OT     Assistance Recommended at Discharge Intermittent Supervision/Assistance  Patient can return home with the following  A little help with walking and/or transfers;A little help with bathing/dressing/bathroom;Assistance with cooking/housework;Assist for transportation;Help with stairs or ramp for entrance   Equipment Recommendations  None recommended by OT    Recommendations for Other Services      Precautions / Restrictions Precautions Precautions: Fall Restrictions Weight Bearing Restrictions: No       Mobility Bed Mobility               General bed mobility comments: pt in recliner upon arrival and ended session in recliner    Transfers Overall transfer level: Needs assistance Equipment used: Rolling walker (2 wheels) Transfers: Sit to/from Stand Sit to Stand: Min guard, Min assist           General transfer comment: min guard from recliner, min  from toilet     Balance Overall balance assessment: Needs assistance   Sitting balance-Leahy Scale: Good     Standing balance support: Bilateral upper extremity supported, During functional activity Standing balance-Leahy Scale: Fair Standing balance comment: can release walker in standing at sink                           ADL either performed or assessed with clinical judgement   ADL Overall ADL's : Needs assistance/impaired Eating/Feeding: Independent;Sitting   Grooming: Supervision/safety;Standing                   Toilet Transfer: Ambulation;Rolling walker (2 wheels);Minimal assistance Toilet Transfer Details (indicate cue type and reason): assist from low toilet Toileting- Clothing Manipulation and Hygiene: Set up;Sitting/lateral lean Toileting - Clothing Manipulation Details (indicate cue type and reason): needs encouragement to wipe herself     Functional mobility during ADLs: Supervision/safety;Rolling walker (2 wheels)      Extremity/Trunk Assessment              Vision   Additional Comments: denies any visual problems this visit   Perception     Praxis      Cognition Arousal/Alertness: Awake/alert Behavior During Therapy: WFL for tasks assessed/performed Overall Cognitive Status: Impaired/Different from baseline Area of Impairment: Problem solving                             Problem Solving: Slow processing General Comments: increased time to process soiled brief and changing it today, intermitten cues provided  Exercises      Shoulder Instructions       General Comments VSS on room air    Pertinent Vitals/ Pain       Pain Assessment Pain Assessment: No/denies pain  Home Living                                          Prior Functioning/Environment              Frequency  Min 2X/week        Progress Toward Goals  OT Goals(current goals can now be found in the care  plan section)  Progress towards OT goals: Progressing toward goals  Acute Rehab OT Goals OT Goal Formulation: With patient Time For Goal Achievement: 07/18/22 Potential to Achieve Goals: Good  Plan Discharge plan needs to be updated    Co-evaluation                 AM-PAC OT "6 Clicks" Daily Activity     Outcome Measure   Help from another person eating meals?: None Help from another person taking care of personal grooming?: A Little Help from another person toileting, which includes using toliet, bedpan, or urinal?: A Little Help from another person bathing (including washing, rinsing, drying)?: A Little Help from another person to put on and taking off regular upper body clothing?: A Little Help from another person to put on and taking off regular lower body clothing?: A Little 6 Click Score: 19    End of Session Equipment Utilized During Treatment: Gait belt;Rolling walker (2 wheels)  OT Visit Diagnosis: Unsteadiness on feet (R26.81);Muscle weakness (generalized) (M62.81);Other abnormalities of gait and mobility (R26.89)   Activity Tolerance Patient tolerated treatment well   Patient Left in chair;with call bell/phone within reach;with chair alarm set;with family/visitor present   Nurse Communication          Time: 1425-1440 OT Time Calculation (min): 15 min  Charges: OT General Charges $OT Visit: 1 Visit OT Treatments $Self Care/Home Management : 8-22 mins Cleta Alberts, OTR/L Acute Rehabilitation Services Office: [phone]   Malka So 07/10/2022, 3:11 PM

## 2022-07-10 NOTE — Progress Notes (Signed)
Physical Therapy Treatment Patient Details Name: [name] MRN: [id number] DOB: 12-10-1940 Today's Date: 07/10/2022   History of Present Illness 82 y.o. female presents to [facility] hospital on 07/02/2022 with worsening weakness. Pt admitted for management of acute cystitis. PMH includes CKD IV, HTN, RA, hypothyroidism, CABG, COPD, HLD, PE.    PT Comments    Pt tolerated today's session well, progressing with ambulation and transfers. Pt able to complete sit<>stand transfers from chair and bed with minG to supervision for safety, requiring minA to stand from the toilet, but ambulating with supervision and RW today. Pt continues to progress well with therapy, able to increase B step length and posture with ambulation but requiring intermittent cues to maintain throughout the session. Acute PT will continue to follow up with pt as appropriate to progress mobility, discharge recommendations updated to HHPT due to continued progression.     Recommendations for follow up therapy are one component of a multi-disciplinary discharge planning process, led by the attending physician.  Recommendations may be updated based on patient status, additional functional criteria and insurance authorization.  Follow Up Recommendations  Home health PT Can patient physically be transported by private vehicle: Yes   Assistance Recommended at Discharge Intermittent Supervision/Assistance  Patient can return home with the following Assistance with cooking/housework;Assist for transportation;Help with stairs or ramp for entrance;A little help with walking and/or transfers   Equipment Recommendations  None recommended by PT    Recommendations for Other Services       Precautions / Restrictions Precautions Precautions: Fall Restrictions Weight Bearing Restrictions: No     Mobility  Bed Mobility               General bed mobility comments: pt in recliner upon arrival and ended session in recliner     Transfers Overall transfer level: Needs assistance Equipment used: Rolling walker (2 wheels) Transfers: Sit to/from Stand Sit to Stand: Min guard, Min assist           General transfer comment: minA required to stand from toilet, but able to stand from chair and bed with minG    Ambulation/Gait Ambulation/Gait assistance: Supervision Gait Distance (Feet): 200 Feet (x15' and x10' other trials for toilet transfer and back to bed for donning brief) Assistive device: Rolling walker (2 wheels) Gait Pattern/deviations: Step-through pattern, Decreased stride length, Trunk flexed Gait velocity: decreased     General Gait Details: cues for downward gaze and upright posture with proximity to RW, pt correcting with cues but intermittently reverting back to downward gaze. Improved obstacle negotiation today, able to increase B step length with cues   Stairs             Wheelchair Mobility    Modified Rankin (Stroke Patients Only)       Balance Overall balance assessment: Needs assistance Sitting-balance support: Feet supported, No upper extremity supported Sitting balance-Leahy Scale: Good Sitting balance - Comments: stable sitting balance   Standing balance support: Bilateral upper extremity supported, During functional activity Standing balance-Leahy Scale: Fair Standing balance comment: reliant on RW for ambulation but able to perform standing with brief pull-up with 1UE support                            Cognition Arousal/Alertness: Awake/alert Behavior During Therapy: WFL for tasks assessed/performed Overall Cognitive Status: Impaired/Different from baseline Area of Impairment: Problem solving  Problem Solving: Slow processing General Comments: increased time to process soiled brief and changing it today, intermitten cues provided        Exercises Other Exercises Other Exercises: sit<>stand x5 reps for BLE  strengthening    General Comments General comments (skin integrity, edema, etc.): VSS on room air      Pertinent Vitals/Pain Pain Assessment Pain Assessment: No/denies pain    Home Living                          Prior Function            PT Goals (current goals can now be found in the care plan section) Acute Rehab PT Goals Patient Stated Goal: to improve strength, return to independence PT Goal Formulation: With patient Time For Goal Achievement: 07/17/22 Potential to Achieve Goals: Good Progress towards PT goals: Progressing toward goals    Frequency    Min 3X/week      PT Plan Discharge plan needs to be updated    Co-evaluation              AM-PAC PT "6 Clicks" Mobility   Outcome Measure  Help needed turning from your back to your side while in a flat bed without using bedrails?: A Little Help needed moving from lying on your back to sitting on the side of a flat bed without using bedrails?: A Little Help needed moving to and from a bed to a chair (including a wheelchair)?: A Little Help needed standing up from a chair using your arms (e.g., wheelchair or bedside chair)?: A Little Help needed to walk in hospital room?: A Little Help needed climbing 3-5 steps with a railing? : A Lot 6 Click Score: 17    End of Session Equipment Utilized During Treatment: Gait belt Activity Tolerance: Patient tolerated treatment well Patient left: in chair;with call bell/phone within reach;with chair alarm set;with family/visitor present Nurse Communication: Mobility status PT Visit Diagnosis: Other abnormalities of gait and mobility (R26.89);Muscle weakness (generalized) (M62.81);Repeated falls (R29.6)     Time: [id number] PT Time Calculation (min) (ACUTE ONLY): 32 min  Charges:  $Gait Training: 8-22 mins $Therapeutic Activity: 8-22 mins                     Charlynne Cousins, PT DPT Acute Rehabilitation Services Office [phone]    Luvenia Heller 07/10/2022, 1:05 PM

## 2022-07-10 NOTE — Progress Notes (Signed)
PROGRESS NOTE    [name]  [id number] DOB: [birth year] DOA: 07/02/2022 PCP: Cassandria Anger, MD   Brief Narrative:  [name] is a 82 y.o. female with a history of CKD stage IV, hypertension, rheumatoid arthritis, hypothyroidism, CAD s/p CABG, COPD, hyperlipidemia, DVT/PE. Patient presented secondary to weakness and fever and is found to have evidence of sepsis with a likely urinary source. Empiric antibiotics started and cultures obtained. Patient also found to have hyponatremia with associated dehydration and AKI. IV fluids started. Blood cultures significant for E. Coli bacteremia. Renal function improving with IV fluids. Workup significant for acute polynephritis/bacteremia, as well she did develop melena secondary to gastric/duodenal ulcers.   Assessment & Plan:   Principal Problem:   Acute pyelonephritis Active Problems:   Acute renal failure superimposed on stage 4 chronic kidney disease (HCC)   Acute metabolic encephalopathy   Hyponatremia   Sepsis with acute renal failure (HCC)   Acquired hypothyroidism   Essential hypertension   Rheumatoid arthritis (HCC)   Anticoagulated   Hx of CABG   Hyperlipidemia   COPD (chronic obstructive pulmonary disease) (HCC)   CKD (chronic kidney disease) stage 4, GFR 15-29 ml/min (HCC) - baseline SCr 2.5. follows with Dr. Royce Macadamia with [facility]   History of pulmonary embolism   Upper GI bleed   Melena   Metabolic acidosis, increased anion gap   Generalized weakness   DNR (do not resuscitate)/DNI(Do Not Intubate)   E coli bacteremia   Dark stools   Anemia   Acute gastric ulcer with hemorrhage   Duodenal ulcer   Sepsis due to acute hemorrhagic cystitis /right pyelonephritis with E. coli bacteremia Sepsis present on admission - Patient with non-specific symptoms but with associated fever and leukocytosis.  She was noted to have significant right flank pain/CVA tenderness on exam . Urinalysis suggests infection,  confirmed on urine culture. Patient started empirically on Ceftriaxone IV. Associated E. Coli bacteremia -on IV  Ceftriaxone 2 g IV, narrowed to IV cefazolin, now on IV Unasyn.  Patient is afebrile but she has slightly worsened leukocytosis today.   Acute blood loss anemia Acute upper GI bleed due to gastric ulcer/duodenal ulcers -Developed melena 3/8, Eliquis has been held, GI input greatly appreciated, underwent EGD 07/08/2022, significant for gastric and duodenal ulcer, no evidence of active bleed, recommendation to hold anticoagulation for few days and for Protonix twice daily for 6 weeks and then once daily.  Continue to monitor CBC closely.  Hemoglobin is stable.  Will resume Eliquis 07/14/2022.   Hyponatremia Sodium of 124 on admission. Likely related to dehydration and hypovolemia, present on admission.  -Resolved with IV hydration.   Acute metabolic encephalopathy  Likely related to overall illness but more specifically dehydration and hyponatremia. Patient is from independent living.  She is now alert and oriented.   Acute renal failure superimposed on stage 4 chronic kidney disease (HCC) Metabolic acidosis, increased anion gap - baseline SCr 2.5. follows with Dr. Royce Macadamia with [facility] Creatinine of 4.70 on admission secondary to poor oral intake in addition to patient continuing diuretic use.  Creatinine improved to 2.8 but then has been creeping up for last few days and currently 3.48.  Bicarb 19.  Continue bicarb tablets.   Essential hypertension Patient is managed on Coreg as an outpatient. Uncontrolled while admitted. -Pressure remains uncontrolled despite increasing Coreg .  Also on amlodipine 10 mg p.o. daily.  As needed hydralazine.  Blood pressure fairly controlled.   Hypokalemia -  History of pulmonary embolism -Eliquis on hold due to GI bleed, per recommendations, will continue to hold for few more days and resume on 07/14/2022.   COPD (chronic obstructive  pulmonary disease) (HCC) Stable. Asymptomatic.   Hyperlipidemia -Continue Lipitor daily   Hx of CABG Noted. No chest pain.   Long term (current) use of anticoagulants Noted. Patient is on Eliquis as an outpatient for the indication of PE/DVT which does not qualify for dose reduction, however patient is on reduced dose of 2.5 mg BID as an outpatient; possibly related to history of GI bleeding.   Rheumatoid arthritis ([place]) Patient is on [place] as an outpatient which is held secondary to active infection.   Acquired hypothyroidism -Continue Synthroid 88 mcg daily   Generalized weakness Secondary to acute illness. PT/OT recommending SNF.  Plan to discharge to SNF once medically stable.  Hopefully tomorrow.   Acute hypoxic respiratory failure Pneumonia -She was requiring 2 L of oxygen yesterday 24, evidence of early evolving opacity in right lung base, she was encouraged to use incentive spirometer, wean oxygen as tolerated, started on IV Unasyn.  She is currently comfortable on room air.  DVT prophylaxis: SCDs Start: 07/03/22 0431   Code Status: DNR  Family Communication: Daughter present at bedside.  Plan of care discussed with patient in length and he/she verbalized understanding and agreed with it.  Status is: Inpatient Remains inpatient appropriate because: Rising creatinine and dehydration.  Needs IV fluids.   Estimated body mass index is 27.06 kg/m as calculated from the following:   Height as of this encounter: '5\' 3"'$  (1.6 m).   Weight as of this encounter: 69.3 kg.    Nutritional Assessment: Body mass index is 27.06 kg/m.[name] Seen by dietician.  I agree with the assessment and plan as outlined below: Nutrition Status:        . Skin Assessment: I have examined the patient's skin and I agree with the wound assessment as performed by the wound care RN as outlined below:    Consultants:  GI  Procedures:  CT  Antimicrobials:  Anti-infectives (From admission,  onward)    Start     Dose/Rate Route Frequency Ordered Stop   07/08/22 1245  ampicillin-sulbactam (UNASYN) 1.5 g in sodium chloride 0.9 % 100 mL IVPB        1.5 g 200 mL/hr over 30 Minutes Intravenous Every 12 hours 07/08/22 1154 07/13/22 2159   07/05/22 2000  ceFAZolin (ANCEF) IVPB 2g/100 mL premix  Status:  Discontinued        2 g 200 mL/hr over 30 Minutes Intravenous Every 12 hours 07/05/22 0958 07/05/22 1004   07/05/22 1530  ceFAZolin (ANCEF) IVPB 2g/100 mL premix  Status:  Discontinued        2 g 200 mL/hr over 30 Minutes Intravenous Every 12 hours 07/05/22 1004 07/08/22 1145   07/04/22 0000  cefTRIAXone (ROCEPHIN) 1 g in sodium chloride 0.9 % 100 mL IVPB  Status:  Discontinued        1 g 200 mL/hr over 30 Minutes Intravenous Every 24 hours 07/03/22 0211 07/03/22 1153   07/03/22 1600  cefTRIAXone (ROCEPHIN) 2 g in sodium chloride 0.9 % 100 mL IVPB  Status:  Discontinued        2 g 200 mL/hr over 30 Minutes Intravenous Every 24 hours 07/03/22 1153 07/05/22 0958   07/03/22 0215  cefTRIAXone (ROCEPHIN) 1 g in sodium chloride 0.9 % 100 mL IVPB        1 g  200 mL/hr over 30 Minutes Intravenous  Once 07/03/22 0212 07/03/22 0243   07/03/22 0145  vancomycin (VANCOREADY) IVPB 1250 mg/250 mL  Status:  Discontinued        1,250 mg 166.7 mL/hr over 90 Minutes Intravenous  Once 07/03/22 0132 07/03/22 0210   07/03/22 0130  ceFEPIme (MAXIPIME) 2 g in sodium chloride 0.9 % 100 mL IVPB  Status:  Discontinued        2 g 200 mL/hr over 30 Minutes Intravenous  Once 07/03/22 0124 07/03/22 0210   07/03/22 0130  metroNIDAZOLE (FLAGYL) IVPB 500 mg  Status:  Discontinued        500 mg 100 mL/hr over 60 Minutes Intravenous  Once 07/03/22 0124 07/03/22 0210   07/03/22 0130  vancomycin (VANCOCIN) IVPB 1000 mg/200 mL premix  Status:  Discontinued        1,000 mg 200 mL/hr over 60 Minutes Intravenous  Once 07/03/22 0124 07/03/22 0130         Subjective: Patient seen and examined.  Daughter at the  bedside.  Patient has no complaints.  She is  Objective: Vitals:   07/09/22 2149 07/09/22 2355 07/10/22 0350 07/10/22 0900  BP: (!) 162/65 (!) 158/57 (!) 140/119 (!) 152/67  Pulse:  92 94 95  Resp:  19 (!) 25 18  Temp:  98.2 F (36.8 C) 97.9 F (36.6 C) (!) 97.5 F (36.4 C)  TempSrc:  Oral Oral Oral  SpO2:  91% 90% 94%  Weight:      Height:        Intake/Output Summary (Last 24 hours) at 07/10/2022 1032 Last data filed at 07/10/2022 0414 Gross per 24 hour  Intake 340 ml  Output 1000 ml  Net -660 ml   Filed Weights   07/07/22 0349 07/08/22 0423 07/08/22 0835  Weight: 69.3 kg 69.3 kg 69.3 kg    Examination:  General exam: Appears calm and comfortable but also appears clinically dry. Respiratory system: Clear to auscultation. Respiratory effort normal. Cardiovascular system: S1 & S2 heard, RRR. No JVD, murmurs, rubs, gallops or clicks.  +2 pitting bilateral lower extremity. Gastrointestinal system: Abdomen is nondistended, soft and nontender. No organomegaly or masses felt. Normal bowel sounds heard. Central nervous system: Alert and oriented. No focal neurological deficits. Extremities: Symmetric 5 x 5 power. Skin: No rashes, lesions or ulcers Psychiatry: Judgement and insight appear normal. Mood & affect appropriate.    Data Reviewed: I have personally reviewed following labs and imaging studies  CBC: Recent Labs  Lab 07/04/22 0709 07/05/22 0659 07/07/22 1530 07/07/22 2240 07/08/22 0553 07/09/22 0345 07/10/22 0224  WBC 20.1*   < > 13.0* 14.9* 14.6* 15.7* 17.4*  NEUTROABS 16.2*  --   --   --   --   --   --   HGB 11.0*   < > 10.2* 8.7* 9.1* 8.6* 8.7*  HCT 31.4*   < > 30.6* 25.4* 27.0* 25.6* 25.9*  MCV 89.0   < > 91.9 91.0 91.5 91.8 91.8  PLT 204   < > 280 269 275 297 337   < > = values in this interval not displayed.   Basic Metabolic Panel: Recent Labs  Lab 07/04/22 0709 07/05/22 0659 07/06/22 0720 07/07/22 0802 07/08/22 0553 07/09/22 0345  07/10/22 0224  NA 134*   < > 133* 137 138 137 136  K 3.5   < > 3.2* 4.3 4.2 3.6 3.7  CL 100   < > 103 107 109 108 105  CO2 18*   < >  19* 18* 15* 19* 19*  GLUCOSE 78   < > 116* 120* 106* 103* 103*  BUN 82*   < > 70* 65* 57* 55* 55*  CREATININE 3.69*   < > 2.93* 2.89* 3.08* 3.27* 3.48*  CALCIUM 8.0*   < > 8.4* 9.3 9.2 9.1 9.2  MG 1.8  --   --   --   --   --   --    < > = values in this interval not displayed.   GFR: Estimated Creatinine Clearance: 11.2 mL/min (A) (by C-G formula based on SCr of 3.48 mg/dL (H)). Liver Function Tests: Recent Labs  Lab 07/04/22 0709  AST 25  ALT 17  ALKPHOS 93  BILITOT 0.7  PROT 5.4*  ALBUMIN 2.2*   No results for input(s): "LIPASE", "AMYLASE" in the last 168 hours. No results for input(s): "AMMONIA" in the last 168 hours. Coagulation Profile: No results for input(s): "INR", "PROTIME" in the last 168 hours. Cardiac Enzymes: No results for input(s): "CKTOTAL", "CKMB", "CKMBINDEX", "TROPONINI" in the last 168 hours. BNP (last 3 results) No results for input(s): "PROBNP" in the last 8760 hours. HbA1C: No results for input(s): "HGBA1C" in the last 72 hours. CBG: No results for input(s): "GLUCAP" in the last 168 hours. Lipid Profile: No results for input(s): "CHOL", "HDL", "LDLCALC", "TRIG", "CHOLHDL", "LDLDIRECT" in the last 72 hours. Thyroid Function Tests: No results for input(s): "TSH", "T4TOTAL", "FREET4", "T3FREE", "THYROIDAB" in the last 72 hours. Anemia Panel: No results for input(s): "VITAMINB12", "FOLATE", "FERRITIN", "TIBC", "IRON", "RETICCTPCT" in the last 72 hours. Sepsis Labs: No results for input(s): "PROCALCITON", "LATICACIDVEN" in the last 168 hours.  Recent Results (from the past 240 hour(s))  Resp panel by RT-PCR (RSV, Flu A&B, Covid) Peripheral     Status: None   Collection Time: 07/02/22 11:00 PM   Specimen: Peripheral; Nasal Swab  Result Value Ref Range Status   SARS Coronavirus 2 by RT PCR NEGATIVE NEGATIVE Final    Influenza A by PCR NEGATIVE NEGATIVE Final   Influenza B by PCR NEGATIVE NEGATIVE Final    Comment: (NOTE) The Xpert Xpress SARS-CoV-2/FLU/RSV plus assay is intended as an aid in the diagnosis of influenza from Nasopharyngeal swab specimens and should not be used as a sole basis for treatment. Nasal washings and aspirates are unacceptable for Xpert Xpress SARS-CoV-2/FLU/RSV testing.  Fact Sheet for Patients: EntrepreneurPulse.com.au  Fact Sheet for Healthcare Providers: IncredibleEmployment.be  This test is not yet approved or cleared by the [place] FDA and has been authorized for detection and/or diagnosis of SARS-CoV-2 by FDA under an Emergency Use Authorization (EUA). This EUA will remain in effect (meaning this test can be used) for the duration of the COVID-19 declaration under Section 564(b)(1) of the Act, 21 U.S.C. section 360bbb-3(b)(1), unless the authorization is terminated or revoked.     Resp Syncytial Virus by PCR NEGATIVE NEGATIVE Final    Comment: (NOTE) Fact Sheet for Patients: EntrepreneurPulse.com.au  Fact Sheet for Healthcare Providers: IncredibleEmployment.be  This test is not yet approved or cleared by the [place] FDA and has been authorized for detection and/or diagnosis of SARS-CoV-2 by FDA under an Emergency Use Authorization (EUA). This EUA will remain in effect (meaning this test can be used) for the duration of the COVID-19 declaration under Section 564(b)(1) of the Act, 21 U.S.C. section 360bbb-3(b)(1), unless the authorization is terminated or revoked.  Performed at [facility], [place] [address] [place], [place] [address]   Blood Culture (routine x  2)     Status: Abnormal   Collection Time: 07/02/22 11:00 PM   Specimen: BLOOD  Result Value Ref Range Status   Specimen Description BLOOD RIGHT ARM  Final   Special Requests   Final    BOTTLES DRAWN AEROBIC  AND ANAEROBIC Blood Culture adequate volume   Culture  Setup Time   Final    GRAM NEGATIVE RODS IN BOTH AEROBIC AND ANAEROBIC BOTTLES CRITICAL RESULT CALLED TO, READ BACK BY AND VERIFIED WITH: CATHY PIERCE ON 07/03/22 @ 1520 BY DRT Performed at [facility], [place] [address], [place], [place] [address]    Culture ESCHERICHIA COLI (A)  Final   Report Status 07/05/2022 FINAL  Final   Organism ID, Bacteria ESCHERICHIA COLI  Final      Susceptibility   Escherichia coli - MIC*    AMPICILLIN >=32 RESISTANT Resistant     CEFEPIME <=0.12 SENSITIVE Sensitive     CEFTAZIDIME <=1 SENSITIVE Sensitive     CEFTRIAXONE <=0.25 SENSITIVE Sensitive     CIPROFLOXACIN <=0.25 SENSITIVE Sensitive     GENTAMICIN <=1 SENSITIVE Sensitive     IMIPENEM <=0.25 SENSITIVE Sensitive     TRIMETH/SULFA <=20 SENSITIVE Sensitive     AMPICILLIN/SULBACTAM 8 SENSITIVE Sensitive     PIP/TAZO <=4 SENSITIVE Sensitive     * ESCHERICHIA COLI  Blood Culture ID Panel (Reflexed)     Status: Abnormal   Collection Time: 07/02/22 11:00 PM  Result Value Ref Range Status   Enterococcus faecalis NOT DETECTED NOT DETECTED Final   Enterococcus Faecium NOT DETECTED NOT DETECTED Final   Listeria monocytogenes NOT DETECTED NOT DETECTED Final   Staphylococcus species NOT DETECTED NOT DETECTED Final   Staphylococcus aureus (BCID) NOT DETECTED NOT DETECTED Final   Staphylococcus epidermidis NOT DETECTED NOT DETECTED Final   Staphylococcus lugdunensis NOT DETECTED NOT DETECTED Final   Streptococcus species NOT DETECTED NOT DETECTED Final   Streptococcus agalactiae NOT DETECTED NOT DETECTED Final   Streptococcus pneumoniae NOT DETECTED NOT DETECTED Final   Streptococcus pyogenes NOT DETECTED NOT DETECTED Final   A.calcoaceticus-baumannii NOT DETECTED NOT DETECTED Final   Bacteroides fragilis NOT DETECTED NOT DETECTED Final   Enterobacterales DETECTED (A) NOT DETECTED Final    Comment: Enterobacterales represent a large order of gram  negative bacteria, not a single organism. CRITICAL RESULT CALLED TO, READ BACK BY AND VERIFIED WITH: CATHY PIERCE ON 07/03/22 @ 1520 BY DRT    Enterobacter cloacae complex NOT DETECTED NOT DETECTED Final   Escherichia coli DETECTED (A) NOT DETECTED Final    Comment: CRITICAL RESULT CALLED TO, READ BACK BY AND VERIFIED WITH: CATHY PIERCE ON 07/03/22 @ 1520 BY DRT    Klebsiella aerogenes NOT DETECTED NOT DETECTED Final   Klebsiella oxytoca NOT DETECTED NOT DETECTED Final   Klebsiella pneumoniae NOT DETECTED NOT DETECTED Final   Proteus species NOT DETECTED NOT DETECTED Final   Salmonella species NOT DETECTED NOT DETECTED Final   Serratia marcescens NOT DETECTED NOT DETECTED Final   Haemophilus influenzae NOT DETECTED NOT DETECTED Final   Neisseria meningitidis NOT DETECTED NOT DETECTED Final   Pseudomonas aeruginosa NOT DETECTED NOT DETECTED Final   Stenotrophomonas maltophilia NOT DETECTED NOT DETECTED Final   Candida albicans NOT DETECTED NOT DETECTED Final   Candida auris NOT DETECTED NOT DETECTED Final   Candida glabrata NOT DETECTED NOT DETECTED Final   Candida krusei NOT DETECTED NOT DETECTED Final   Candida parapsilosis NOT DETECTED NOT DETECTED Final   Candida tropicalis NOT DETECTED NOT  DETECTED Final   Cryptococcus neoformans/gattii NOT DETECTED NOT DETECTED Final   CTX-M ESBL NOT DETECTED NOT DETECTED Final   Carbapenem resistance IMP NOT DETECTED NOT DETECTED Final   Carbapenem resistance KPC NOT DETECTED NOT DETECTED Final   Carbapenem resistance NDM NOT DETECTED NOT DETECTED Final   Carbapenem resist OXA 48 LIKE NOT DETECTED NOT DETECTED Final   Carbapenem resistance VIM NOT DETECTED NOT DETECTED Final    Comment: Performed at [facility], [place] [address] [place], [place] [address]  Urine Culture (for pregnant, neutropenic or urologic patients or patients with an indwelling urinary catheter)     Status: Abnormal   Collection Time: 07/02/22 11:29 PM   Specimen: Urine,  Catheterized  Result Value Ref Range Status   Specimen Description URINE, CATHETERIZED  Final   Special Requests   Final    NONE Performed at [facility], [place] [address] [place], [place] [address]    Culture (A)  Final    >=100,000 COLONIES/mL ESCHERICHIA COLI 80,000 COLONIES/mL KLEBSIELLA PNEUMONIAE    Report Status 07/05/2022 FINAL  Final   Organism ID, Bacteria ESCHERICHIA COLI (A)  Final   Organism ID, Bacteria KLEBSIELLA PNEUMONIAE (A)  Final      Susceptibility   Escherichia coli - MIC*    AMPICILLIN >=32 RESISTANT Resistant     CEFAZOLIN <=4 SENSITIVE Sensitive     CEFEPIME <=0.12 SENSITIVE Sensitive     CEFTRIAXONE <=0.25 SENSITIVE Sensitive     CIPROFLOXACIN <=0.25 SENSITIVE Sensitive     GENTAMICIN <=1 SENSITIVE Sensitive     IMIPENEM <=0.25 SENSITIVE Sensitive     NITROFURANTOIN <=16 SENSITIVE Sensitive     TRIMETH/SULFA <=20 SENSITIVE Sensitive     AMPICILLIN/SULBACTAM 4 SENSITIVE Sensitive     PIP/TAZO <=4 SENSITIVE Sensitive     * >=100,000 COLONIES/mL ESCHERICHIA COLI   Klebsiella pneumoniae - MIC*    AMPICILLIN RESISTANT Resistant     CEFAZOLIN <=4 SENSITIVE Sensitive     CEFEPIME <=0.12 SENSITIVE Sensitive     CEFTRIAXONE <=0.25 SENSITIVE Sensitive     CIPROFLOXACIN <=0.25 SENSITIVE Sensitive     GENTAMICIN <=1 SENSITIVE Sensitive     IMIPENEM <=0.25 SENSITIVE Sensitive     NITROFURANTOIN 32 SENSITIVE Sensitive     TRIMETH/SULFA <=20 SENSITIVE Sensitive     AMPICILLIN/SULBACTAM <=2 SENSITIVE Sensitive     PIP/TAZO <=4 SENSITIVE Sensitive     * 80,000 COLONIES/mL KLEBSIELLA PNEUMONIAE  Blood Culture (routine x 2)     Status: Abnormal   Collection Time: 07/03/22  2:20 AM   Specimen: BLOOD  Result Value Ref Range Status   Specimen Description BLOOD LEFT ARM  Final   Special Requests   Final    BOTTLES DRAWN AEROBIC AND ANAEROBIC Blood Culture adequate volume   Culture  Setup Time   Final    GRAM NEGATIVE RODS IN BOTH AEROBIC AND ANAEROBIC  BOTTLES CRITICAL VALUE NOTED.  VALUE IS CONSISTENT WITH PREVIOUSLY REPORTED AND CALLED VALUE.    Culture (A)  Final    ESCHERICHIA COLI SUSCEPTIBILITIES PERFORMED ON PREVIOUS CULTURE WITHIN THE LAST 5 DAYS. Performed at [facility], [place] [address], [place], [place] [address]    Report Status 07/05/2022 FINAL  Final     Radiology Studies: DG CHEST PORT 1 VIEW  Result Date: 07/08/2022 CLINICAL DATA:  Shortness of breath. EXAM: PORTABLE CHEST 1 VIEW COMPARISON:  07/02/2022 FINDINGS: Median sternotomy and CABG. The heart size is normal. There are small bilateral pleural effusions.  There is new bibasilar patchy opacity. IMPRESSION: 1. Small bilateral pleural effusions. 2. New bibasilar patchy opacities and cardiomegaly. Findings are consistent with pulmonary edema or infectious cause. Electronically Signed   By: Nolon Nations M.D.   On: 07/08/2022 11:38    Scheduled Meds:  acidophilus  1 capsule Oral TID   amLODipine  10 mg Oral Daily   atorvastatin  80 mg Oral Daily   carvedilol  6.25 mg Oral BID WC   levothyroxine  88 mcg Oral QAC breakfast   pantoprazole (PROTONIX) IV  40 mg Intravenous Q12H   sodium bicarbonate  650 mg Oral BID   triamcinolone  1 spray Each Nare Daily   Continuous Infusions:  sodium chloride     ampicillin-sulbactam (UNASYN) IV 1.5 g (07/10/22 0918)     LOS: 7 days   Sherry Cheney, MD [facility]  07/10/2022, 10:32 AM   *Please note that this is a verbal dictation therefore any spelling or grammatical errors are due to the "[employer]" system interpretation.  Please page via [place] and do not message via secure chat for urgent patient care matters. Secure chat can be used for non urgent patient care matters.  How to contact the [facility] Attending or Consulting provider [place] or covering provider during after hours [place], for this patient?  Check the care team in [facility] and look for a) attending/consulting [facility] provider listed and b) the [facility]  team listed. Page or secure chat [phone]. Log into [email or web address] and use [place]'s universal password to access. If you do not have the password, please contact the hospital operator. Locate the [facility] provider you are looking for under [facility] and page to a number that you can be directly reached. If you still have difficulty reaching the provider, please page the [facility] (Director on Call) for the Hospitalists listed on amion for assistance.

## 2022-07-11 ENCOUNTER — Inpatient Hospital Stay (HOSPITAL_COMMUNITY): Payer: Medicare PPO

## 2022-07-11 ENCOUNTER — Encounter (HOSPITAL_COMMUNITY): Payer: Self-pay | Admitting: Gastroenterology

## 2022-07-11 DIAGNOSIS — N10 Acute tubulo-interstitial nephritis: Secondary | ICD-10-CM | POA: Diagnosis not present

## 2022-07-11 LAB — BASIC METABOLIC PANEL
Anion gap: 12 (ref 5–15)
BUN: 55 mg/dL — ABNORMAL HIGH (ref 8–23)
CO2: 17 mmol/L — ABNORMAL LOW (ref 22–32)
Calcium: 9 mg/dL (ref 8.9–10.3)
Chloride: 108 mmol/L (ref 98–111)
Creatinine, Ser: 3.7 mg/dL — ABNORMAL HIGH (ref 0.44–1.00)
GFR, Estimated: 12 mL/min — ABNORMAL LOW (ref >60)
Glucose, Bld: 94 mg/dL (ref 70–99)
Potassium: 4.2 mmol/L (ref 3.5–5.1)
Sodium: 137 mmol/L (ref 135–145)

## 2022-07-11 LAB — CBC
HCT: 25.4 % — ABNORMAL LOW (ref 36.0–46.0)
Hemoglobin: 8.6 g/dL — ABNORMAL LOW (ref 12.0–15.0)
MCH: 31.3 pg (ref 26.0–34.0)
MCHC: 33.9 g/dL (ref 30.0–36.0)
MCV: 92.4 fL (ref 80.0–100.0)
Platelets: 342 10*3/uL (ref 150–400)
RBC: 2.75 MIL/uL — ABNORMAL LOW (ref 3.87–5.11)
RDW: 18.9 % — ABNORMAL HIGH (ref 11.5–15.5)
WBC: 18.2 10*3/uL — ABNORMAL HIGH (ref 4.0–10.5)
nRBC: 0.4 % — ABNORMAL HIGH (ref 0.0–0.2)

## 2022-07-11 LAB — SURGICAL PATHOLOGY

## 2022-07-11 MED ORDER — LACTATED RINGERS IV SOLN: INTRAVENOUS | Status: AC

## 2022-07-11 MED ORDER — ALBUMIN HUMAN 25 % IV SOLN
12.5000 g | Freq: Four times a day (QID) | INTRAVENOUS | Status: AC
  Administered 2022-07-11 (×3): 12.5 g via INTRAVENOUS
  Filled 2022-07-11 (×3): qty 50

## 2022-07-11 NOTE — TOC Progression Note (Signed)
Transition of Care [facility]) - Progression Note    Patient Details  Name: [name] MRN: [id number] Date of Birth: 1940-06-25  Transition of Care [facility]) CM/SW Contact  Levonne Lapping, RN Phone Number: 07/11/2022, 3:48 PM  Clinical Narrative:     CM went to meet with Patient at bedside to discuss potential transition of care plans. Patient was sleeping but was accompanied by her Husband and Daughter.   Originally SNF was recommended for discharge dispo but PT has changed it to Home with Eva.  Family is very concerned about this  Patient is requiring assistance and Husband doesn't feel capable of providing it although he wishes he could. Patient and  Husband live in an apartment in [place]. Husband and Daughter still want Patient to go to SNF for rehab before returning home .   Final Recs TBD      Family shared concerns about potential transfer of Patient to med surge unit . They feel patient needs the progressive level of care. They voiced concerns over her kidney function and O2 sats. Of Note- This CM observed patient on 4 L of O2 , sleeping and O2 sats were all over the place. Went from 94 to 82 to 80 then 84. CM readjusted nasal cannula and sats were still bouncing around the 80's. They finally went back to 92 . CM made floor RN aware.  TOC will continue to follow patient for any additional discharge needs     Expected Discharge Plan: [place] Barriers to Discharge: Continued Medical Work up  Expected Discharge Plan and Services In-house Referral: Clinical Social Work     Living arrangements for the past 2 months: [place]                                       Social Determinants of Health (SDOH) Interventions SDOH Screenings   Food Insecurity: No Food Insecurity (06/13/2022)  Housing: Low Risk  (06/13/2022)  Transportation Needs: No Transportation Needs (06/13/2022)  Utilities: Not At Risk (06/13/2022)   Alcohol Screen: Low Risk  (05/30/2021)  Depression (PHQ2-9): Low Risk  (06/15/2022)  Financial Resource Strain: Low Risk  (06/13/2022)  Physical Activity: Inactive (06/13/2022)  Social Connections: Unknown (06/13/2022)  Stress: No Stress Concern Present (06/13/2022)  Tobacco Use: Medium Risk (07/11/2022)    Readmission Risk Interventions    07/05/2022    9:42 AM  Readmission Risk Prevention Plan  Transportation Screening Complete  Medication Review (RN Care Manager) Complete  PCP or Specialist appointment within 3-5 days of discharge Complete  HRI or [place] Complete  SW Recovery Care/Counseling Consult Complete  [place] Complete

## 2022-07-11 NOTE — Progress Notes (Signed)
PROGRESS NOTE    [name]  [id number] DOB: [birth year] DOA: 07/02/2022 PCP: Cassandria Anger, MD   Brief Narrative:  [name] is a 82 y.o. female with a history of CKD stage IV, hypertension, rheumatoid arthritis, hypothyroidism, CAD s/p CABG, COPD, hyperlipidemia, DVT/PE. Patient presented secondary to weakness and fever and is found to have evidence of sepsis with a likely urinary source. Empiric antibiotics started and cultures obtained. Patient also found to have hyponatremia with associated dehydration and AKI. IV fluids started. Blood cultures significant for E. Coli bacteremia. Renal function improving with IV fluids. Workup significant for acute polynephritis/bacteremia, as well she did develop melena secondary to gastric/duodenal ulcers.   Assessment & Plan:   Principal Problem:   Acute pyelonephritis Active Problems:   Acute renal failure superimposed on stage 4 chronic kidney disease (HCC)   Acute metabolic encephalopathy   Hyponatremia   Sepsis with acute renal failure (HCC)   Acquired hypothyroidism   Essential hypertension   Rheumatoid arthritis (HCC)   Anticoagulated   Hx of CABG   Hyperlipidemia   COPD (chronic obstructive pulmonary disease) (HCC)   CKD (chronic kidney disease) stage 4, GFR 15-29 ml/min (HCC) - baseline SCr 2.5. follows with Dr. Royce Macadamia with [facility]   History of pulmonary embolism   Upper GI bleed   Melena   Metabolic acidosis, increased anion gap   Generalized weakness   DNR (do not resuscitate)/DNI(Do Not Intubate)   E coli bacteremia   Dark stools   Anemia   Acute gastric ulcer with hemorrhage   Duodenal ulcer   Sepsis due to acute hemorrhagic cystitis /right pyelonephritis with E. coli bacteremia Sepsis present on admission - Patient with non-specific symptoms but with associated fever and leukocytosis.  She was noted to have significant right flank pain/CVA tenderness on exam . Urinalysis suggests infection,  confirmed on urine culture. Patient started empirically on Ceftriaxone IV. Associated E. Coli bacteremia -on IV  Ceftriaxone 2 g IV, narrowed to IV cefazolin, now on IV Unasyn.  Patient is afebrile but she has slightly worsened leukocytosis again today.   Acute blood loss anemia Acute upper GI bleed due to gastric ulcer/duodenal ulcers -Developed melena 3/8, Eliquis has been held, GI input greatly appreciated, underwent EGD 07/08/2022, significant for gastric and duodenal ulcer, no evidence of active bleed, recommendation to hold anticoagulation for few days and for Protonix twice daily for 6 weeks and then once daily.  Continue to monitor CBC closely.  Hemoglobin is stable.  Will resume Eliquis 07/14/2022.   Hyponatremia Sodium of 124 on admission. Likely related to dehydration and hypovolemia, present on admission.  -Resolved with IV hydration.   Acute metabolic encephalopathy  Likely related to overall illness but more specifically dehydration and hyponatremia. Patient is from independent living.  She is now alert and oriented.   Acute renal failure superimposed on stage 4 chronic kidney disease (HCC) Metabolic acidosis, increased anion gap - baseline SCr 2.5. follows with Dr. Royce Macadamia with [facility] Creatinine of 4.70 on admission secondary to poor oral intake in addition to patient continuing diuretic use.  Creatinine improved to 2.8 but then has been creeping up for last few days and currently 3.7.  Bicarb 17.  Continue bicarb tablets.  Will resume IV fluids.  She has edema so I will also give her some albumin as well.  Renal ultrasound negative for obstruction.  Will repeat labs in the morning.  If no improvement, will consult nephrology.   Essential hypertension Patient  is managed on Coreg as an outpatient. Uncontrolled while admitted. -Pressure remains uncontrolled despite increasing Coreg .  Also on amlodipine 10 mg p.o. daily.  As needed hydralazine.  Blood pressure fairly  controlled.   Hypokalemia Resolved.   History of pulmonary embolism -Eliquis on hold due to GI bleed, per recommendations, will continue to hold for few more days and resume on 07/14/2022.   COPD (chronic obstructive pulmonary disease) (HCC) Stable. Asymptomatic.   Hyperlipidemia -Continue Lipitor daily   Hx of CABG Noted. No chest pain.   Long term (current) use of anticoagulants Noted. Patient is on Eliquis as an outpatient for the indication of PE/DVT which does not qualify for dose reduction, however patient is on reduced dose of 2.5 mg BID as an outpatient; possibly related to history of GI bleeding.   Rheumatoid arthritis ([place]) Patient is on [place] as an outpatient which is held secondary to active infection.   Acquired hypothyroidism -Continue Synthroid 88 mcg daily   Generalized weakness Secondary to acute illness. PT/OT recommending SNF.  Plan to discharge to SNF once medically stable.  Hopefully tomorrow.   Acute hypoxic respiratory failure Pneumonia -She was requiring 2 L of oxygen yesterday 24, evidence of early evolving opacity in right lung base, she was encouraged to use incentive spirometer, wean oxygen as tolerated, started on IV Unasyn.  She is currently comfortable on room air.  DVT prophylaxis: SCDs Start: 07/03/22 0431   Code Status: DNR  Family Communication: Daughter present at bedside.  Plan of care discussed with patient in length and he/she verbalized understanding and agreed with it.  Status is: Inpatient Remains inpatient appropriate because: Rising creatinine and dehydration.  Needs IV fluids.   Estimated body mass index is 25.81 kg/m as calculated from the following:   Height as of this encounter: '5\' 3"'$  (1.6 m).   Weight as of this encounter: 66.1 kg.    Nutritional Assessment: Body mass index is 25.81 kg/m.[name] Seen by dietician.  I agree with the assessment and plan as outlined below: Nutrition Status:        . Skin Assessment: I  have examined the patient's skin and I agree with the wound assessment as performed by the wound care RN as outlined below:    Consultants:  GI  Procedures:  CT  Antimicrobials:  Anti-infectives (From admission, onward)    Start     Dose/Rate Route Frequency Ordered Stop   07/08/22 1245  ampicillin-sulbactam (UNASYN) 1.5 g in sodium chloride 0.9 % 100 mL IVPB        1.5 g 200 mL/hr over 30 Minutes Intravenous Every 12 hours 07/08/22 1154 07/13/22 2159   07/05/22 2000  ceFAZolin (ANCEF) IVPB 2g/100 mL premix  Status:  Discontinued        2 g 200 mL/hr over 30 Minutes Intravenous Every 12 hours 07/05/22 0958 07/05/22 1004   07/05/22 1530  ceFAZolin (ANCEF) IVPB 2g/100 mL premix  Status:  Discontinued        2 g 200 mL/hr over 30 Minutes Intravenous Every 12 hours 07/05/22 1004 07/08/22 1145   07/04/22 0000  cefTRIAXone (ROCEPHIN) 1 g in sodium chloride 0.9 % 100 mL IVPB  Status:  Discontinued        1 g 200 mL/hr over 30 Minutes Intravenous Every 24 hours 07/03/22 0211 07/03/22 1153   07/03/22 1600  cefTRIAXone (ROCEPHIN) 2 g in sodium chloride 0.9 % 100 mL IVPB  Status:  Discontinued        2  g 200 mL/hr over 30 Minutes Intravenous Every 24 hours 07/03/22 1153 07/05/22 0958   07/03/22 0215  cefTRIAXone (ROCEPHIN) 1 g in sodium chloride 0.9 % 100 mL IVPB        1 g 200 mL/hr over 30 Minutes Intravenous  Once 07/03/22 0212 07/03/22 0243   07/03/22 0145  vancomycin (VANCOREADY) IVPB 1250 mg/250 mL  Status:  Discontinued        1,250 mg 166.7 mL/hr over 90 Minutes Intravenous  Once 07/03/22 0132 07/03/22 0210   07/03/22 0130  ceFEPIme (MAXIPIME) 2 g in sodium chloride 0.9 % 100 mL IVPB  Status:  Discontinued        2 g 200 mL/hr over 30 Minutes Intravenous  Once 07/03/22 0124 07/03/22 0210   07/03/22 0130  metroNIDAZOLE (FLAGYL) IVPB 500 mg  Status:  Discontinued        500 mg 100 mL/hr over 60 Minutes Intravenous  Once 07/03/22 0124 07/03/22 0210   07/03/22 0130  vancomycin  (VANCOCIN) IVPB 1000 mg/200 mL premix  Status:  Discontinued        1,000 mg 200 mL/hr over 60 Minutes Intravenous  Once 07/03/22 0124 07/03/22 0130         Subjective: Patient seen and examined.  She has no complaints.  Daughter at the bedside.  Objective: Vitals:   07/10/22 2005 07/10/22 2318 07/11/22 0431 07/11/22 0500  BP: (!) 143/57 (!) 152/62 (!) 168/62   Pulse: 81 87 89   Resp: '19 20 19   '$ Temp: 97.6 F (36.4 C) 97.7 F (36.5 C) 97.6 F (36.4 C)   TempSrc: Oral Oral Oral   SpO2: 96% 96% 94%   Weight:    66.1 kg  Height:        Intake/Output Summary (Last 24 hours) at 07/11/2022 1027 Last data filed at 07/11/2022 0536 Gross per 24 hour  Intake 860 ml  Output 200 ml  Net 660 ml    Filed Weights   07/08/22 0423 07/08/22 0835 07/11/22 0500  Weight: 69.3 kg 69.3 kg 66.1 kg    Examination:  General exam: Appears calm and comfortable  Respiratory system: Clear to auscultation. Respiratory effort normal. Cardiovascular system: S1 & S2 heard, RRR. No JVD, murmurs, rubs, gallops or clicks. No pedal edema. Gastrointestinal system: Abdomen is nondistended, soft and nontender. No organomegaly or masses felt. Normal bowel sounds heard. Central nervous system: Alert and oriented. No focal neurological deficits. Extremities: Symmetric 5 x 5 power. Skin: No rashes, lesions or ulcers.  Psychiatry: Judgement and insight appear normal. Mood & affect appropriate.    Data Reviewed: I have personally reviewed following labs and imaging studies  CBC: Recent Labs  Lab 07/07/22 2240 07/08/22 0553 07/09/22 0345 07/10/22 0224 07/11/22 0330  WBC 14.9* 14.6* 15.7* 17.4* 18.2*  HGB 8.7* 9.1* 8.6* 8.7* 8.6*  HCT 25.4* 27.0* 25.6* 25.9* 25.4*  MCV 91.0 91.5 91.8 91.8 92.4  PLT 269 275 297 337 [id number]    Basic Metabolic Panel: Recent Labs  Lab 07/07/22 0802 07/08/22 0553 07/09/22 0345 07/10/22 0224 07/11/22 0330  NA 137 138 137 136 137  K 4.3 4.2 3.6 3.7 4.2  CL 107 109  108 105 108  CO2 18* 15* 19* 19* 17*  GLUCOSE 120* 106* 103* 103* 94  BUN 65* 57* 55* 55* 55*  CREATININE 2.89* 3.08* 3.27* 3.48* 3.70*  CALCIUM 9.3 9.2 9.1 9.2 9.0    GFR: Estimated Creatinine Clearance: 10.3 mL/min (A) (by C-G formula based on SCr of  3.7 mg/dL (H)). Liver Function Tests: No results for input(s): "AST", "ALT", "ALKPHOS", "BILITOT", "PROT", "ALBUMIN" in the last 168 hours.  No results for input(s): "LIPASE", "AMYLASE" in the last 168 hours. No results for input(s): "AMMONIA" in the last 168 hours. Coagulation Profile: No results for input(s): "INR", "PROTIME" in the last 168 hours. Cardiac Enzymes: No results for input(s): "CKTOTAL", "CKMB", "CKMBINDEX", "TROPONINI" in the last 168 hours. BNP (last 3 results) No results for input(s): "PROBNP" in the last 8760 hours. HbA1C: No results for input(s): "HGBA1C" in the last 72 hours. CBG: No results for input(s): "GLUCAP" in the last 168 hours. Lipid Profile: No results for input(s): "CHOL", "HDL", "LDLCALC", "TRIG", "CHOLHDL", "LDLDIRECT" in the last 72 hours. Thyroid Function Tests: No results for input(s): "TSH", "T4TOTAL", "FREET4", "T3FREE", "THYROIDAB" in the last 72 hours. Anemia Panel: No results for input(s): "VITAMINB12", "FOLATE", "FERRITIN", "TIBC", "IRON", "RETICCTPCT" in the last 72 hours. Sepsis Labs: No results for input(s): "PROCALCITON", "LATICACIDVEN" in the last 168 hours.  Recent Results (from the past 240 hour(s))  Resp panel by RT-PCR (RSV, Flu A&B, Covid) Peripheral     Status: None   Collection Time: 07/02/22 11:00 PM   Specimen: Peripheral; Nasal Swab  Result Value Ref Range Status   SARS Coronavirus 2 by RT PCR NEGATIVE NEGATIVE Final   Influenza A by PCR NEGATIVE NEGATIVE Final   Influenza B by PCR NEGATIVE NEGATIVE Final    Comment: (NOTE) The Xpert Xpress SARS-CoV-2/FLU/RSV plus assay is intended as an aid in the diagnosis of influenza from Nasopharyngeal swab specimens and should  not be used as a sole basis for treatment. Nasal washings and aspirates are unacceptable for Xpert Xpress SARS-CoV-2/FLU/RSV testing.  Fact Sheet for Patients: EntrepreneurPulse.com.au  Fact Sheet for Healthcare Providers: IncredibleEmployment.be  This test is not yet approved or cleared by the [place] FDA and has been authorized for detection and/or diagnosis of SARS-CoV-2 by FDA under an Emergency Use Authorization (EUA). This EUA will remain in effect (meaning this test can be used) for the duration of the COVID-19 declaration under Section 564(b)(1) of the Act, 21 U.S.C. section 360bbb-3(b)(1), unless the authorization is terminated or revoked.     Resp Syncytial Virus by PCR NEGATIVE NEGATIVE Final    Comment: (NOTE) Fact Sheet for Patients: EntrepreneurPulse.com.au  Fact Sheet for Healthcare Providers: IncredibleEmployment.be  This test is not yet approved or cleared by the [place] FDA and has been authorized for detection and/or diagnosis of SARS-CoV-2 by FDA under an Emergency Use Authorization (EUA). This EUA will remain in effect (meaning this test can be used) for the duration of the COVID-19 declaration under Section 564(b)(1) of the Act, 21 U.S.C. section 360bbb-3(b)(1), unless the authorization is terminated or revoked.  Performed at [facility], [place] [address] [place], [place] [address]   Blood Culture (routine x 2)     Status: Abnormal   Collection Time: 07/02/22 11:00 PM   Specimen: BLOOD  Result Value Ref Range Status   Specimen Description BLOOD RIGHT ARM  Final   Special Requests   Final    BOTTLES DRAWN AEROBIC AND ANAEROBIC Blood Culture adequate volume   Culture  Setup Time   Final    GRAM NEGATIVE RODS IN BOTH AEROBIC AND ANAEROBIC BOTTLES CRITICAL RESULT CALLED TO, READ BACK BY AND VERIFIED WITH: CATHY PIERCE ON 07/03/22 @ 1520 BY DRT Performed at [facility], [place] [address], [place], [place] [address]    Culture ESCHERICHIA COLI (  A)  Final   Report Status 07/05/2022 FINAL  Final   Organism ID, Bacteria ESCHERICHIA COLI  Final      Susceptibility   Escherichia coli - MIC*    AMPICILLIN >=32 RESISTANT Resistant     CEFEPIME <=0.12 SENSITIVE Sensitive     CEFTAZIDIME <=1 SENSITIVE Sensitive     CEFTRIAXONE <=0.25 SENSITIVE Sensitive     CIPROFLOXACIN <=0.25 SENSITIVE Sensitive     GENTAMICIN <=1 SENSITIVE Sensitive     IMIPENEM <=0.25 SENSITIVE Sensitive     TRIMETH/SULFA <=20 SENSITIVE Sensitive     AMPICILLIN/SULBACTAM 8 SENSITIVE Sensitive     PIP/TAZO <=4 SENSITIVE Sensitive     * ESCHERICHIA COLI  Blood Culture ID Panel (Reflexed)     Status: Abnormal   Collection Time: 07/02/22 11:00 PM  Result Value Ref Range Status   Enterococcus faecalis NOT DETECTED NOT DETECTED Final   Enterococcus Faecium NOT DETECTED NOT DETECTED Final   Listeria monocytogenes NOT DETECTED NOT DETECTED Final   Staphylococcus species NOT DETECTED NOT DETECTED Final   Staphylococcus aureus (BCID) NOT DETECTED NOT DETECTED Final   Staphylococcus epidermidis NOT DETECTED NOT DETECTED Final   Staphylococcus lugdunensis NOT DETECTED NOT DETECTED Final   Streptococcus species NOT DETECTED NOT DETECTED Final   Streptococcus agalactiae NOT DETECTED NOT DETECTED Final   Streptococcus pneumoniae NOT DETECTED NOT DETECTED Final   Streptococcus pyogenes NOT DETECTED NOT DETECTED Final   A.calcoaceticus-baumannii NOT DETECTED NOT DETECTED Final   Bacteroides fragilis NOT DETECTED NOT DETECTED Final   Enterobacterales DETECTED (A) NOT DETECTED Final    Comment: Enterobacterales represent a large order of gram negative bacteria, not a single organism. CRITICAL RESULT CALLED TO, READ BACK BY AND VERIFIED WITH: CATHY PIERCE ON 07/03/22 @ 1520 BY DRT    Enterobacter cloacae complex NOT DETECTED NOT DETECTED Final   Escherichia coli DETECTED (A) NOT DETECTED  Final    Comment: CRITICAL RESULT CALLED TO, READ BACK BY AND VERIFIED WITH: CATHY PIERCE ON 07/03/22 @ 1520 BY DRT    Klebsiella aerogenes NOT DETECTED NOT DETECTED Final   Klebsiella oxytoca NOT DETECTED NOT DETECTED Final   Klebsiella pneumoniae NOT DETECTED NOT DETECTED Final   Proteus species NOT DETECTED NOT DETECTED Final   Salmonella species NOT DETECTED NOT DETECTED Final   Serratia marcescens NOT DETECTED NOT DETECTED Final   Haemophilus influenzae NOT DETECTED NOT DETECTED Final   Neisseria meningitidis NOT DETECTED NOT DETECTED Final   Pseudomonas aeruginosa NOT DETECTED NOT DETECTED Final   Stenotrophomonas maltophilia NOT DETECTED NOT DETECTED Final   Candida albicans NOT DETECTED NOT DETECTED Final   Candida auris NOT DETECTED NOT DETECTED Final   Candida glabrata NOT DETECTED NOT DETECTED Final   Candida krusei NOT DETECTED NOT DETECTED Final   Candida parapsilosis NOT DETECTED NOT DETECTED Final   Candida tropicalis NOT DETECTED NOT DETECTED Final   Cryptococcus neoformans/gattii NOT DETECTED NOT DETECTED Final   CTX-M ESBL NOT DETECTED NOT DETECTED Final   Carbapenem resistance IMP NOT DETECTED NOT DETECTED Final   Carbapenem resistance KPC NOT DETECTED NOT DETECTED Final   Carbapenem resistance NDM NOT DETECTED NOT DETECTED Final   Carbapenem resist OXA 48 LIKE NOT DETECTED NOT DETECTED Final   Carbapenem resistance VIM NOT DETECTED NOT DETECTED Final    Comment: Performed at [facility], [place] [address], [place], [place] [address]  Urine Culture (for pregnant, neutropenic or urologic patients or patients with an indwelling urinary catheter)     Status: Abnormal  Collection Time: 07/02/22 11:29 PM   Specimen: Urine, Catheterized  Result Value Ref Range Status   Specimen Description URINE, CATHETERIZED  Final   Special Requests   Final    NONE Performed at [facility], [address] [place], [place] [address]    Culture (A)  Final    >=100,000  COLONIES/mL ESCHERICHIA COLI 80,000 COLONIES/mL KLEBSIELLA PNEUMONIAE    Report Status 07/05/2022 FINAL  Final   Organism ID, Bacteria ESCHERICHIA COLI (A)  Final   Organism ID, Bacteria KLEBSIELLA PNEUMONIAE (A)  Final      Susceptibility   Escherichia coli - MIC*    AMPICILLIN >=32 RESISTANT Resistant     CEFAZOLIN <=4 SENSITIVE Sensitive     CEFEPIME <=0.12 SENSITIVE Sensitive     CEFTRIAXONE <=0.25 SENSITIVE Sensitive     CIPROFLOXACIN <=0.25 SENSITIVE Sensitive     GENTAMICIN <=1 SENSITIVE Sensitive     IMIPENEM <=0.25 SENSITIVE Sensitive     NITROFURANTOIN <=16 SENSITIVE Sensitive     TRIMETH/SULFA <=20 SENSITIVE Sensitive     AMPICILLIN/SULBACTAM 4 SENSITIVE Sensitive     PIP/TAZO <=4 SENSITIVE Sensitive     * >=100,000 COLONIES/mL ESCHERICHIA COLI   Klebsiella pneumoniae - MIC*    AMPICILLIN RESISTANT Resistant     CEFAZOLIN <=4 SENSITIVE Sensitive     CEFEPIME <=0.12 SENSITIVE Sensitive     CEFTRIAXONE <=0.25 SENSITIVE Sensitive     CIPROFLOXACIN <=0.25 SENSITIVE Sensitive     GENTAMICIN <=1 SENSITIVE Sensitive     IMIPENEM <=0.25 SENSITIVE Sensitive     NITROFURANTOIN 32 SENSITIVE Sensitive     TRIMETH/SULFA <=20 SENSITIVE Sensitive     AMPICILLIN/SULBACTAM <=2 SENSITIVE Sensitive     PIP/TAZO <=4 SENSITIVE Sensitive     * 80,000 COLONIES/mL KLEBSIELLA PNEUMONIAE  Blood Culture (routine x 2)     Status: Abnormal   Collection Time: 07/03/22  2:20 AM   Specimen: BLOOD  Result Value Ref Range Status   Specimen Description BLOOD LEFT ARM  Final   Special Requests   Final    BOTTLES DRAWN AEROBIC AND ANAEROBIC Blood Culture adequate volume   Culture  Setup Time   Final    GRAM NEGATIVE RODS IN BOTH AEROBIC AND ANAEROBIC BOTTLES CRITICAL VALUE NOTED.  VALUE IS CONSISTENT WITH PREVIOUSLY REPORTED AND CALLED VALUE.    Culture (A)  Final    ESCHERICHIA COLI SUSCEPTIBILITIES PERFORMED ON PREVIOUS CULTURE WITHIN THE LAST 5 DAYS. Performed at [facility],  [place] [address], [place], [place] [address]    Report Status 07/05/2022 FINAL  Final     Radiology Studies: US RENAL  Result Date: 07/11/2022 CLINICAL DATA:  Acute kidney injury in 82 year old female. EXAM: RENAL / URINARY TRACT ULTRASOUND COMPLETE COMPARISON:  MRI from November 10, 2021 FINDINGS: Right Kidney: Renal measurements: 9.6 x 4.1 x 4.2 cm = volume: 88 mL. No signs of hydronephrosis. Increased cortical echogenicity with marked renal cortical/parenchymal scarring. Cysts of the RIGHT kidney largest 3.9 x 4.7 x 3.6 cm in the upper pole with several smaller cysts not substantially changed compared to previous imaging for which no additional dedicated follow-up imaging is recommend. Left Kidney: Renal measurements: 9.8 x 4.5 x 4.4 cm = volume: 103 mL. Cysts on the LEFT as well largest 2.5 x 1.8 x 2.1 cm for which no additional dedicated follow-up imaging is recommended. Increased cortical echogenicity and parenchymal scarring is very similar to the recent/prior MRI evaluation. There is no hydronephrosis. Bladder: Urinary bladder is decompressed not  well evaluated. Other: None. IMPRESSION: 1. No signs of hydronephrosis. 2. Increased cortical echogenicity and parenchymal scarring in both kidneys as can be seen in the setting of chronic medical renal disease. Electronically Signed   By: Zetta Bills M.D.   On: 07/11/2022 09:05    Scheduled Meds:  acidophilus  1 capsule Oral TID   amLODipine  10 mg Oral Daily   atorvastatin  80 mg Oral Daily   carvedilol  6.25 mg Oral BID WC   levothyroxine  88 mcg Oral QAC breakfast   pantoprazole (PROTONIX) IV  40 mg Intravenous Q12H   sodium bicarbonate  650 mg Oral BID   triamcinolone  1 spray Each Nare Daily   Continuous Infusions:  sodium chloride Stopped (07/11/22 0401)   ampicillin-sulbactam (UNASYN) IV 1.5 g (07/11/22 1024)   lactated ringers       LOS: 8 days   Darliss Cheney, MD [facility]  07/11/2022, 10:27 AM   *Please note that this is a  verbal dictation therefore any spelling or grammatical errors are due to the "[place] [facility]" system interpretation.  Please page via [place] and do not message via secure chat for urgent patient care matters. Secure chat can be used for non urgent patient care matters.  How to contact the [facility] Attending or Consulting provider [place] or covering provider during after hours [place], for this patient?  Check the care team in [facility] and look for a) attending/consulting TRH provider listed and b) the [facility] team listed. Page or secure chat [phone]. Log into [email or web address] and use [place]'s universal password to access. If you do not have the password, please contact the hospital operator. Locate the [facility] provider you are looking for under [facility] and page to a number that you can be directly reached. If you still have difficulty reaching the provider, please page the [facility] (Director on Call) for the Hospitalists listed on amion for assistance.

## 2022-07-11 NOTE — Plan of Care (Signed)

## 2022-07-12 DIAGNOSIS — N10 Acute tubulo-interstitial nephritis: Secondary | ICD-10-CM | POA: Diagnosis not present

## 2022-07-12 LAB — CBC
HCT: 23 % — ABNORMAL LOW (ref 36.0–46.0)
Hemoglobin: 7.8 g/dL — ABNORMAL LOW (ref 12.0–15.0)
MCH: 31.2 pg (ref 26.0–34.0)
MCHC: 33.9 g/dL (ref 30.0–36.0)
MCV: 92 fL (ref 80.0–100.0)
Platelets: 310 10*3/uL (ref 150–400)
RBC: 2.5 MIL/uL — ABNORMAL LOW (ref 3.87–5.11)
RDW: 18.7 % — ABNORMAL HIGH (ref 11.5–15.5)
WBC: 15.6 10*3/uL — ABNORMAL HIGH (ref 4.0–10.5)
nRBC: 0.4 % — ABNORMAL HIGH (ref 0.0–0.2)

## 2022-07-12 LAB — BASIC METABOLIC PANEL
Anion gap: 11 (ref 5–15)
BUN: 50 mg/dL — ABNORMAL HIGH (ref 8–23)
CO2: 18 mmol/L — ABNORMAL LOW (ref 22–32)
Calcium: 8.8 mg/dL — ABNORMAL LOW (ref 8.9–10.3)
Chloride: 110 mmol/L (ref 98–111)
Creatinine, Ser: 3.57 mg/dL — ABNORMAL HIGH (ref 0.44–1.00)
GFR, Estimated: 12 mL/min — ABNORMAL LOW (ref >60)
Glucose, Bld: 111 mg/dL — ABNORMAL HIGH (ref 70–99)
Potassium: 3.9 mmol/L (ref 3.5–5.1)
Sodium: 139 mmol/L (ref 135–145)

## 2022-07-12 LAB — BRAIN NATRIURETIC PEPTIDE: B Natriuretic Peptide: 865.3 pg/mL — ABNORMAL HIGH (ref 0.0–100.0)

## 2022-07-12 LAB — PROCALCITONIN: Procalcitonin: 0.7 ng/mL

## 2022-07-12 MED ORDER — PANTOPRAZOLE SODIUM 40 MG PO TBEC
40.0000 mg | DELAYED_RELEASE_TABLET | Freq: Two times a day (BID) | ORAL | Status: DC
  Administered 2022-07-12 – 2022-07-25 (×27): 40 mg via ORAL
  Filled 2022-07-12 (×27): qty 1

## 2022-07-12 MED ORDER — FUROSEMIDE 10 MG/ML IJ SOLN
40.0000 mg | Freq: Once | INTRAMUSCULAR | Status: AC
  Administered 2022-07-12: 40 mg via INTRAVENOUS
  Filled 2022-07-12: qty 4

## 2022-07-12 MED ORDER — ALBUMIN HUMAN 25 % IV SOLN
12.5000 g | Freq: Four times a day (QID) | INTRAVENOUS | Status: AC
  Administered 2022-07-12 – 2022-07-13 (×4): 12.5 g via INTRAVENOUS
  Filled 2022-07-12 (×4): qty 50

## 2022-07-12 NOTE — TOC Progression Note (Signed)
Transition of Care [facility]) - Progression Note    Patient Details  Name: [name] MRN: [id number] Date of Birth: Jan 07, 1941  Transition of [facility]) CM/SW [place], LCSW Phone Number: 07/12/2022, 9:54 AM  Clinical Narrative:    CSW spoke with Joellen Jersey at [facility] and she is in agreement for CSW to try for insurance approval again for SNF side. CSW will start auth once therapy sees patient today.    Expected Discharge Plan: [place] Barriers to Discharge: Continued Medical Work up  Expected Discharge Plan and Services In-house Referral: Clinical Social Work     Living arrangements for the past 2 months: [place]                                       Social Determinants of Health (SDOH) Interventions SDOH Screenings   Food Insecurity: No Food Insecurity (06/13/2022)  Housing: Low Risk  (06/13/2022)  Transportation Needs: No Transportation Needs (06/13/2022)  Utilities: Not At Risk (06/13/2022)  Alcohol Screen: Low Risk  (05/30/2021)  Depression (PHQ2-9): Low Risk  (06/15/2022)  Financial Resource Strain: Low Risk  (06/13/2022)  Physical Activity: Inactive (06/13/2022)  Social Connections: Unknown (06/13/2022)  Stress: No Stress Concern Present (06/13/2022)  Tobacco Use: Medium Risk (07/11/2022)    Readmission Risk Interventions    07/05/2022    9:42 AM  Readmission Risk Prevention Plan  Transportation Screening Complete  Medication Review (RN Care Manager) Complete  PCP or Specialist appointment within 3-5 days of discharge Complete  HRI or [place] Complete  SW Recovery Care/Counseling Consult Complete  Palliative Care Screening Not Applicable  Skilled Nursing Facility Complete

## 2022-07-12 NOTE — Progress Notes (Signed)
Mobility Specialist Progress Note   07/12/22 1851  Mobility  Activity Ambulated with assistance in hallway  Level of Assistance Contact guard assist, steadying assist  Assistive Device Front wheel walker  Distance Ambulated (ft) 240 ft  Activity Response Tolerated well  Mobility Referral Yes  $Mobility charge 1 Mobility   Pre Mobility: 84 HR, 98% SpO2 During Mobility: 94 HR, 96% SpO2 Post Mobility: 92 HR, 95% SpO2  Received pt in chair c/o swelling in BLE but agreeable. Able to ambulate hallways w/ a steady gait but pt getting fatigued as session progressed. Able to returned back to chair w/o fault, call bell in reach and needs met.    Holland Falling Mobility Specialist Please contact via SecureChat or  Rehab office at [phone]

## 2022-07-12 NOTE — Progress Notes (Signed)
PROGRESS NOTE    [name]  [id number] DOB: 12-07-1940 DOA: 07/02/2022 PCP: Cassandria Anger, MD   Brief Narrative:  [name] is a 82 y.o. female with a history of CKD stage IV, hypertension, rheumatoid arthritis, hypothyroidism, CAD s/p CABG, COPD, hyperlipidemia, DVT/PE. Patient presented secondary to weakness and fever and is found to have evidence of sepsis with a likely urinary source. Empiric antibiotics started and cultures obtained. Patient also found to have hyponatremia with associated dehydration and AKI. IV fluids started. Blood cultures significant for E. Coli bacteremia. Renal function improving with IV fluids. Workup significant for acute polynephritis/bacteremia, as well she did develop melena secondary to gastric/duodenal ulcers.   Assessment & Plan:   Principal Problem:   Acute pyelonephritis Active Problems:   Acute renal failure superimposed on stage 4 chronic kidney disease (HCC)   Acute metabolic encephalopathy   Hyponatremia   Sepsis with acute renal failure (HCC)   Acquired hypothyroidism   Essential hypertension   Rheumatoid arthritis (HCC)   Anticoagulated   Hx of CABG   Hyperlipidemia   COPD (chronic obstructive pulmonary disease) (HCC)   CKD (chronic kidney disease) stage 4, GFR 15-29 ml/min (HCC) - baseline SCr 2.5. follows with Dr. Royce Macadamia with [facility]   History of pulmonary embolism   Upper GI bleed   Melena   Metabolic acidosis, increased anion gap   Generalized weakness   DNR (do not resuscitate)/DNI(Do Not Intubate)   E coli bacteremia   Dark stools   Anemia   Acute gastric ulcer with hemorrhage   Duodenal ulcer   Sepsis due to acute hemorrhagic cystitis /right pyelonephritis with E. coli bacteremia Sepsis present on admission - Patient with non-specific symptoms but with associated fever and leukocytosis.  She was noted to have significant right flank pain/CVA tenderness on exam . Urinalysis suggests infection,  confirmed on urine culture. Patient started empirically on Ceftriaxone IV. Associated E. Coli bacteremia -on IV  Ceftriaxone 2 g IV, narrowed to IV cefazolin, now on IV Unasyn.  Patient is afebrile and and finally her leukocytosis is improving.   Acute blood loss anemia Acute upper GI bleed due to gastric ulcer/duodenal ulcers -Developed melena 3/8, Eliquis has been held, GI input greatly appreciated, underwent EGD 07/08/2022, significant for gastric and duodenal ulcer, no evidence of active bleed, recommendation to hold anticoagulation for few days and for Protonix twice daily for 6 weeks and then once daily.  Continue to monitor CBC closely.  Hemoglobin is stable.  Will resume Eliquis 07/14/2022.  Hemoglobin 7.8.   Hyponatremia Sodium of 124 on admission. Likely related to dehydration and hypovolemia, present on admission.  -Resolved with IV hydration.   Acute metabolic encephalopathy  Likely related to overall illness but more specifically dehydration and hyponatremia. Patient is from independent living.  She is now alert and oriented.   Acute renal failure superimposed on stage 4 chronic kidney disease (HCC) Metabolic acidosis, increased anion gap - baseline SCr 2.5. follows with Dr. Royce Macadamia with [facility] Creatinine of 4.70 on admission secondary to poor oral intake in addition to patient continuing diuretic use.  Creatinine improved to 2.8 but then has been creeping up for last few days and improved somewhat today and currently 3.57.  Bicarb 18.  Continue bicarb tablets.  Renal ultrasound negative for obstruction.  She has +3 pitting edema bilateral lower extremity.  I gave her some albumin yesterday and I think that greatly helped her bring creatinine down.  I will repeat full doses  of albumin today and will give her a dose of Lasix as well.  Will repeat labs in the morning.   Essential hypertension Patient is managed on Coreg as an outpatient. Uncontrolled while admitted. -Pressure  remains uncontrolled despite increasing Coreg .  Also on amlodipine 10 mg p.o. daily.  As needed hydralazine.  Blood pressure fairly controlled.   Hypokalemia Resolved.   History of pulmonary embolism -Eliquis on hold due to GI bleed, per recommendations, will continue to hold for few more days and resume on 07/14/2022.   COPD (chronic obstructive pulmonary disease) (HCC) Stable. Asymptomatic.   Hyperlipidemia -Continue Lipitor daily   Hx of CABG Noted. No chest pain.   Long term (current) use of anticoagulants Noted. Patient is on Eliquis as an outpatient for the indication of PE/DVT which does not qualify for dose reduction, however patient is on reduced dose of 2.5 mg BID as an outpatient; possibly related to history of GI bleeding.   Rheumatoid arthritis ([place]) Patient is on [place] as an outpatient which is held secondary to active infection.   Acquired hypothyroidism -Continue Synthroid 88 mcg daily   Generalized weakness Secondary to acute illness. PT/OT recommending SNF.  Plan to discharge to SNF once medically stable.  Hopefully tomorrow.   Acute hypoxic respiratory failure Pneumonia Comfortable on room air.  Continue Unasyn.  DVT prophylaxis: SCDs Start: 07/03/22 0431   Code Status: DNR  Family Communication: Daughter present at bedside.  Plan of care discussed with patient in length and he/she verbalized understanding and agreed with it.  Status is: Inpatient Remains inpatient appropriate because: Needs IV albumin.  Creatinine is still elevated.   Estimated body mass index is 25.81 kg/m as calculated from the following:   Height as of this encounter: '5\' 3"'$  (1.6 m).   Weight as of this encounter: 66.1 kg.    Nutritional Assessment: Body mass index is 25.81 kg/m.[name] Seen by dietician.  I agree with the assessment and plan as outlined below: Nutrition Status:        . Skin Assessment: I have examined the patient's skin and I agree with the wound assessment  as performed by the wound care RN as outlined below:    Consultants:  GI  Procedures:  CT  Antimicrobials:  Anti-infectives (From admission, onward)    Start     Dose/Rate Route Frequency Ordered Stop   07/08/22 1245  ampicillin-sulbactam (UNASYN) 1.5 g in sodium chloride 0.9 % 100 mL IVPB        1.5 g 200 mL/hr over 30 Minutes Intravenous Every 12 hours 07/08/22 1154 07/13/22 2159   07/05/22 2000  ceFAZolin (ANCEF) IVPB 2g/100 mL premix  Status:  Discontinued        2 g 200 mL/hr over 30 Minutes Intravenous Every 12 hours 07/05/22 0958 07/05/22 1004   07/05/22 1530  ceFAZolin (ANCEF) IVPB 2g/100 mL premix  Status:  Discontinued        2 g 200 mL/hr over 30 Minutes Intravenous Every 12 hours 07/05/22 1004 07/08/22 1145   07/04/22 0000  cefTRIAXone (ROCEPHIN) 1 g in sodium chloride 0.9 % 100 mL IVPB  Status:  Discontinued        1 g 200 mL/hr over 30 Minutes Intravenous Every 24 hours 07/03/22 0211 07/03/22 1153   07/03/22 1600  cefTRIAXone (ROCEPHIN) 2 g in sodium chloride 0.9 % 100 mL IVPB  Status:  Discontinued        2 g 200 mL/hr over 30 Minutes Intravenous Every 24  hours 07/03/22 1153 07/05/22 0958   07/03/22 0215  cefTRIAXone (ROCEPHIN) 1 g in sodium chloride 0.9 % 100 mL IVPB        1 g 200 mL/hr over 30 Minutes Intravenous  Once 07/03/22 0212 07/03/22 0243   07/03/22 0145  vancomycin (VANCOREADY) IVPB 1250 mg/250 mL  Status:  Discontinued        1,250 mg 166.7 mL/hr over 90 Minutes Intravenous  Once 07/03/22 0132 07/03/22 0210   07/03/22 0130  ceFEPIme (MAXIPIME) 2 g in sodium chloride 0.9 % 100 mL IVPB  Status:  Discontinued        2 g 200 mL/hr over 30 Minutes Intravenous  Once 07/03/22 0124 07/03/22 0210   07/03/22 0130  metroNIDAZOLE (FLAGYL) IVPB 500 mg  Status:  Discontinued        500 mg 100 mL/hr over 60 Minutes Intravenous  Once 07/03/22 0124 07/03/22 0210   07/03/22 0130  vancomycin (VANCOCIN) IVPB 1000 mg/200 mL premix  Status:  Discontinued        1,000  mg 200 mL/hr over 60 Minutes Intravenous  Once 07/03/22 0124 07/03/22 0130         Subjective:  Patient seen and examined.  She has no complaints.  Daughter at the bedside.  Objective: Vitals:   07/12/22 0322 07/12/22 0800 07/12/22 1000 07/12/22 1051  BP: (!) 155/63  (!) 166/70   Pulse: 85   81  Resp: (!) 22  (!) 21 (!) 21  Temp: 98.3 F (36.8 C) 97.7 F (36.5 C)    TempSrc: Oral Oral    SpO2: 95% 93%  93%  Weight:      Height:       No intake or output data in the 24 hours ending 07/12/22 1200  Filed Weights   07/08/22 0423 07/08/22 0835 07/11/22 0500  Weight: 69.3 kg 69.3 kg 66.1 kg    Examination:  General exam: Appears calm and comfortable  Respiratory system: Clear to auscultation. Respiratory effort normal. Cardiovascular system: S1 & S2 heard, RRR. No JVD, murmurs, rubs, gallops or clicks.  3 pitting edema bilateral lower extremity. Gastrointestinal system: Abdomen is nondistended, soft and nontender. No organomegaly or masses felt. Normal bowel sounds heard. Central nervous system: Alert and oriented. No focal neurological deficits. Extremities: Symmetric 5 x 5 power. Skin: No rashes, lesions or ulcers.  Psychiatry: Judgement and insight appear normal. Mood & affect appropriate.      Data Reviewed: I have personally reviewed following labs and imaging studies  CBC: Recent Labs  Lab 07/08/22 0553 07/09/22 0345 07/10/22 0224 07/11/22 0330 07/12/22 0311  WBC 14.6* 15.7* 17.4* 18.2* 15.6*  HGB 9.1* 8.6* 8.7* 8.6* 7.8*  HCT 27.0* 25.6* 25.9* 25.4* 23.0*  MCV 91.5 91.8 91.8 92.4 92.0  PLT 275 297 337 342 [id number]    Basic Metabolic Panel: Recent Labs  Lab 07/08/22 0553 07/09/22 0345 07/10/22 0224 07/11/22 0330 07/12/22 0311  NA 138 137 136 137 139  K 4.2 3.6 3.7 4.2 3.9  CL 109 108 105 108 110  CO2 15* 19* 19* 17* 18*  GLUCOSE 106* 103* 103* 94 111*  BUN 57* 55* 55* 55* 50*  CREATININE 3.08* 3.27* 3.48* 3.70* 3.57*  CALCIUM 9.2 9.1 9.2 9.0  8.8*    GFR: Estimated Creatinine Clearance: 10.7 mL/min (A) (by C-G formula based on SCr of 3.57 mg/dL (H)). Liver Function Tests: No results for input(s): "AST", "ALT", "ALKPHOS", "BILITOT", "PROT", "ALBUMIN" in the last 168 hours.  No results for  input(s): "LIPASE", "AMYLASE" in the last 168 hours. No results for input(s): "AMMONIA" in the last 168 hours. Coagulation Profile: No results for input(s): "INR", "PROTIME" in the last 168 hours. Cardiac Enzymes: No results for input(s): "CKTOTAL", "CKMB", "CKMBINDEX", "TROPONINI" in the last 168 hours. BNP (last 3 results) No results for input(s): "PROBNP" in the last 8760 hours. HbA1C: No results for input(s): "HGBA1C" in the last 72 hours. CBG: No results for input(s): "GLUCAP" in the last 168 hours. Lipid Profile: No results for input(s): "CHOL", "HDL", "LDLCALC", "TRIG", "CHOLHDL", "LDLDIRECT" in the last 72 hours. Thyroid Function Tests: No results for input(s): "TSH", "T4TOTAL", "FREET4", "T3FREE", "THYROIDAB" in the last 72 hours. Anemia Panel: No results for input(s): "VITAMINB12", "FOLATE", "FERRITIN", "TIBC", "IRON", "RETICCTPCT" in the last 72 hours. Sepsis Labs: Recent Labs  Lab 07/12/22 0826  PROCALCITON 0.70    Recent Results (from the past 240 hour(s))  Resp panel by RT-PCR (RSV, Flu A&B, Covid) Peripheral     Status: None   Collection Time: 07/02/22 11:00 PM   Specimen: Peripheral; Nasal Swab  Result Value Ref Range Status   SARS Coronavirus 2 by RT PCR NEGATIVE NEGATIVE Final   Influenza A by PCR NEGATIVE NEGATIVE Final   Influenza B by PCR NEGATIVE NEGATIVE Final    Comment: (NOTE) The Xpert Xpress SARS-CoV-2/FLU/RSV plus assay is intended as an aid in the diagnosis of influenza from Nasopharyngeal swab specimens and should not be used as a sole basis for treatment. Nasal washings and aspirates are unacceptable for Xpert Xpress SARS-CoV-2/FLU/RSV testing.  Fact Sheet for  Patients: EntrepreneurPulse.com.au  Fact Sheet for Healthcare Providers: IncredibleEmployment.be  This test is not yet approved or cleared by the [place] FDA and has been authorized for detection and/or diagnosis of SARS-CoV-2 by FDA under an Emergency Use Authorization (EUA). This EUA will remain in effect (meaning this test can be used) for the duration of the COVID-19 declaration under Section 564(b)(1) of the Act, 21 U.S.C. section 360bbb-3(b)(1), unless the authorization is terminated or revoked.     Resp Syncytial Virus by PCR NEGATIVE NEGATIVE Final    Comment: (NOTE) Fact Sheet for Patients: EntrepreneurPulse.com.au  Fact Sheet for Healthcare Providers: IncredibleEmployment.be  This test is not yet approved or cleared by the [place] FDA and has been authorized for detection and/or diagnosis of SARS-CoV-2 by FDA under an Emergency Use Authorization (EUA). This EUA will remain in effect (meaning this test can be used) for the duration of the COVID-19 declaration under Section 564(b)(1) of the Act, 21 U.S.C. section 360bbb-3(b)(1), unless the authorization is terminated or revoked.  Performed at [facility], [place] [address], [place], [place] [address]   Blood Culture (routine x 2)     Status: Abnormal   Collection Time: 07/02/22 11:00 PM   Specimen: BLOOD  Result Value Ref Range Status   Specimen Description BLOOD RIGHT ARM  Final   Special Requests   Final    BOTTLES DRAWN AEROBIC AND ANAEROBIC Blood Culture adequate volume   Culture  Setup Time   Final    GRAM NEGATIVE RODS IN BOTH AEROBIC AND ANAEROBIC BOTTLES CRITICAL RESULT CALLED TO, READ BACK BY AND VERIFIED WITH: CATHY PIERCE ON 07/03/22 @ 1520 BY DRT Performed at [facility], [place] [address], [place], [place] [address]    Culture ESCHERICHIA COLI (A)  Final   Report Status 07/05/2022 FINAL  Final   Organism ID,  Bacteria ESCHERICHIA COLI  Final  Susceptibility   Escherichia coli - MIC*    AMPICILLIN >=32 RESISTANT Resistant     CEFEPIME <=0.12 SENSITIVE Sensitive     CEFTAZIDIME <=1 SENSITIVE Sensitive     CEFTRIAXONE <=0.25 SENSITIVE Sensitive     CIPROFLOXACIN <=0.25 SENSITIVE Sensitive     GENTAMICIN <=1 SENSITIVE Sensitive     IMIPENEM <=0.25 SENSITIVE Sensitive     TRIMETH/SULFA <=20 SENSITIVE Sensitive     AMPICILLIN/SULBACTAM 8 SENSITIVE Sensitive     PIP/TAZO <=4 SENSITIVE Sensitive     * ESCHERICHIA COLI  Blood Culture ID Panel (Reflexed)     Status: Abnormal   Collection Time: 07/02/22 11:00 PM  Result Value Ref Range Status   Enterococcus faecalis NOT DETECTED NOT DETECTED Final   Enterococcus Faecium NOT DETECTED NOT DETECTED Final   Listeria monocytogenes NOT DETECTED NOT DETECTED Final   Staphylococcus species NOT DETECTED NOT DETECTED Final   Staphylococcus aureus (BCID) NOT DETECTED NOT DETECTED Final   Staphylococcus epidermidis NOT DETECTED NOT DETECTED Final   Staphylococcus lugdunensis NOT DETECTED NOT DETECTED Final   Streptococcus species NOT DETECTED NOT DETECTED Final   Streptococcus agalactiae NOT DETECTED NOT DETECTED Final   Streptococcus pneumoniae NOT DETECTED NOT DETECTED Final   Streptococcus pyogenes NOT DETECTED NOT DETECTED Final   A.calcoaceticus-baumannii NOT DETECTED NOT DETECTED Final   Bacteroides fragilis NOT DETECTED NOT DETECTED Final   Enterobacterales DETECTED (A) NOT DETECTED Final    Comment: Enterobacterales represent a large order of gram negative bacteria, not a single organism. CRITICAL RESULT CALLED TO, READ BACK BY AND VERIFIED WITH: CATHY PIERCE ON 07/03/22 @ 1520 BY DRT    Enterobacter cloacae complex NOT DETECTED NOT DETECTED Final   Escherichia coli DETECTED (A) NOT DETECTED Final    Comment: CRITICAL RESULT CALLED TO, READ BACK BY AND VERIFIED WITH: CATHY PIERCE ON 07/03/22 @ 1520 BY DRT    Klebsiella aerogenes NOT DETECTED NOT  DETECTED Final   Klebsiella oxytoca NOT DETECTED NOT DETECTED Final   Klebsiella pneumoniae NOT DETECTED NOT DETECTED Final   Proteus species NOT DETECTED NOT DETECTED Final   Salmonella species NOT DETECTED NOT DETECTED Final   Serratia marcescens NOT DETECTED NOT DETECTED Final   Haemophilus influenzae NOT DETECTED NOT DETECTED Final   Neisseria meningitidis NOT DETECTED NOT DETECTED Final   Pseudomonas aeruginosa NOT DETECTED NOT DETECTED Final   Stenotrophomonas maltophilia NOT DETECTED NOT DETECTED Final   Candida albicans NOT DETECTED NOT DETECTED Final   Candida auris NOT DETECTED NOT DETECTED Final   Candida glabrata NOT DETECTED NOT DETECTED Final   Candida krusei NOT DETECTED NOT DETECTED Final   Candida parapsilosis NOT DETECTED NOT DETECTED Final   Candida tropicalis NOT DETECTED NOT DETECTED Final   Cryptococcus neoformans/gattii NOT DETECTED NOT DETECTED Final   CTX-M ESBL NOT DETECTED NOT DETECTED Final   Carbapenem resistance IMP NOT DETECTED NOT DETECTED Final   Carbapenem resistance KPC NOT DETECTED NOT DETECTED Final   Carbapenem resistance NDM NOT DETECTED NOT DETECTED Final   Carbapenem resist OXA 48 LIKE NOT DETECTED NOT DETECTED Final   Carbapenem resistance VIM NOT DETECTED NOT DETECTED Final    Comment: Performed at [facility], [place] [address], [place], [place] [address]  Urine Culture (for pregnant, neutropenic or urologic patients or patients with an indwelling urinary catheter)     Status: Abnormal   Collection Time: 07/02/22 11:29 PM   Specimen: Urine, Catheterized  Result Value Ref Range Status   Specimen Description URINE, CATHETERIZED  Final   Special Requests   Final    NONE Performed at [facility], [place] [address], [place], [place] [address]    Culture (A)  Final    >=100,000 COLONIES/mL ESCHERICHIA COLI 80,000 COLONIES/mL KLEBSIELLA PNEUMONIAE    Report Status 07/05/2022 FINAL  Final   Organism ID, Bacteria ESCHERICHIA COLI (A)   Final   Organism ID, Bacteria KLEBSIELLA PNEUMONIAE (A)  Final      Susceptibility   Escherichia coli - MIC*    AMPICILLIN >=32 RESISTANT Resistant     CEFAZOLIN <=4 SENSITIVE Sensitive     CEFEPIME <=0.12 SENSITIVE Sensitive     CEFTRIAXONE <=0.25 SENSITIVE Sensitive     CIPROFLOXACIN <=0.25 SENSITIVE Sensitive     GENTAMICIN <=1 SENSITIVE Sensitive     IMIPENEM <=0.25 SENSITIVE Sensitive     NITROFURANTOIN <=16 SENSITIVE Sensitive     TRIMETH/SULFA <=20 SENSITIVE Sensitive     AMPICILLIN/SULBACTAM 4 SENSITIVE Sensitive     PIP/TAZO <=4 SENSITIVE Sensitive     * >=100,000 COLONIES/mL ESCHERICHIA COLI   Klebsiella pneumoniae - MIC*    AMPICILLIN RESISTANT Resistant     CEFAZOLIN <=4 SENSITIVE Sensitive     CEFEPIME <=0.12 SENSITIVE Sensitive     CEFTRIAXONE <=0.25 SENSITIVE Sensitive     CIPROFLOXACIN <=0.25 SENSITIVE Sensitive     GENTAMICIN <=1 SENSITIVE Sensitive     IMIPENEM <=0.25 SENSITIVE Sensitive     NITROFURANTOIN 32 SENSITIVE Sensitive     TRIMETH/SULFA <=20 SENSITIVE Sensitive     AMPICILLIN/SULBACTAM <=2 SENSITIVE Sensitive     PIP/TAZO <=4 SENSITIVE Sensitive     * 80,000 COLONIES/mL KLEBSIELLA PNEUMONIAE  Blood Culture (routine x 2)     Status: Abnormal   Collection Time: 07/03/22  2:20 AM   Specimen: BLOOD  Result Value Ref Range Status   Specimen Description BLOOD LEFT ARM  Final   Special Requests   Final    BOTTLES DRAWN AEROBIC AND ANAEROBIC Blood Culture adequate volume   Culture  Setup Time   Final    GRAM NEGATIVE RODS IN BOTH AEROBIC AND ANAEROBIC BOTTLES CRITICAL VALUE NOTED.  VALUE IS CONSISTENT WITH PREVIOUSLY REPORTED AND CALLED VALUE.    Culture (A)  Final    ESCHERICHIA COLI SUSCEPTIBILITIES PERFORMED ON PREVIOUS CULTURE WITHIN THE LAST 5 DAYS. Performed at [facility], [place] [address], [place], [place] [address]    Report Status 07/05/2022 FINAL  Final     Radiology Studies: DG CHEST PORT 1 VIEW  Result Date: 07/11/2022 CLINICAL  DATA:  Hypoxia EXAM: PORTABLE CHEST 1 VIEW COMPARISON:  CXR 07/08/22 FINDINGS: Small bilateral pleural effusions. Status post median sternotomy. No pneumothorax. Bibasilar airspace opacities which could represent atelectasis or infection. Cardiac and mediastinal contours are unchanged. No radiographically apparent displaced rib fractures. Visualized upper abdomen is unremarkable. IMPRESSION: 1. Small bilateral pleural effusions. 2. Redemonstrated bibasilar airspace opacities, which could represent atelectasis or infection. Electronically Signed   By: Marin Roberts M.D.   On: 07/11/2022 16:55   US RENAL  Result Date: 07/11/2022 CLINICAL DATA:  Acute kidney injury in 82 year old female. EXAM: RENAL / URINARY TRACT ULTRASOUND COMPLETE COMPARISON:  MRI from November 10, 2021 FINDINGS: Right Kidney: Renal measurements: 9.6 x 4.1 x 4.2 cm = volume: 88 mL. No signs of hydronephrosis. Increased cortical echogenicity with marked renal cortical/parenchymal scarring. Cysts of the RIGHT kidney largest 3.9 x 4.7 x 3.6 cm in the upper pole with several smaller cysts not substantially changed compared to previous imaging  for which no additional dedicated follow-up imaging is recommend. Left Kidney: Renal measurements: 9.8 x 4.5 x 4.4 cm = volume: 103 mL. Cysts on the LEFT as well largest 2.5 x 1.8 x 2.1 cm for which no additional dedicated follow-up imaging is recommended. Increased cortical echogenicity and parenchymal scarring is very similar to the recent/prior MRI evaluation. There is no hydronephrosis. Bladder: Urinary bladder is decompressed not well evaluated. Other: None. IMPRESSION: 1. No signs of hydronephrosis. 2. Increased cortical echogenicity and parenchymal scarring in both kidneys as can be seen in the setting of chronic medical renal disease. Electronically Signed   By: Zetta Bills M.D.   On: 07/11/2022 09:05    Scheduled Meds:  acidophilus  1 capsule Oral TID   amLODipine  10 mg Oral Daily   atorvastatin   80 mg Oral Daily   carvedilol  6.25 mg Oral BID WC   levothyroxine  88 mcg Oral QAC breakfast   pantoprazole  40 mg Oral BID   sodium bicarbonate  650 mg Oral BID   triamcinolone  1 spray Each Nare Daily   Continuous Infusions:  sodium chloride 100 mL/hr at 07/12/22 1111   albumin human 12.5 g (07/12/22 1006)   ampicillin-sulbactam (UNASYN) IV 1.5 g (07/12/22 0955)     LOS: 9 days   Darliss Cheney, MD [facility]  07/12/2022, 12:00 PM   *Please note that this is a verbal dictation therefore any spelling or grammatical errors are due to the "[place] [employer]" system interpretation.  Please page via [place] and do not message via secure chat for urgent patient care matters. Secure chat can be used for non urgent patient care matters.  How to contact the [facility] Attending or Consulting provider [place] or covering provider during after hours [place], for this patient?  Check the care team in [facility] and look for a) attending/consulting [facility] provider listed and b) the [facility] team listed. Page or secure chat [phone]. Log into [email or web address] and use [place] universal password to access. If you do not have the password, please contact the hospital operator. Locate the [facility] provider you are looking for under [facility] and page to a number that you can be directly reached. If you still have difficulty reaching the provider, please page the [facility] (Director on Call) for the Hospitalists listed on amion for assistance.

## 2022-07-12 NOTE — Progress Notes (Addendum)
Physical Therapy Treatment Patient Details Name: [name] MRN: [id number] DOB: 1940/12/13 Today's Date: 07/12/2022   History of Present Illness 82 y.o. female presents to [facility] hospital on 07/02/2022 with worsening weakness. Pt admitted for management of acute cystitis. PMH includes CKD IV, HTN, RA, hypothyroidism, CABG, COPD, HLD, PE.    PT Comments    Pt tolerated today's session well, continuing to progress with mobility. Pt able to perform transfers and ambulation with use of RW and supervision for safety and line management. Pt cued throughout gait for posture and forward gaze, correcting well with cues and maintaining until fatigued. Pt's daughter asked about ambulating with the pt throughout the day and encouraged use of RW and having nursing staff disconnect lines as able to assist in managing, also encouraged use of BSC instead of brief. Discussed with pt and her daughter any concerns they have with mobility prior to returning home and both reporting difficulty with bed mobility this morning, with pt having falls off of the bed at home prior to admission, will attempt to address bed mobility next session. Pt performing transfers and ambulation with supervision, no LOB noted throughout today's session but family remains concerned about discharge home. Acute PT will continue to follow pt as appropriate to progress safety and independence with mobility. Pt reports only her husband at home, recommend HHPT vs SNF pending on family's ability to provide supervision and intermittent assist to the pt.     Recommendations for follow up therapy are one component of a multi-disciplinary discharge planning process, led by the attending physician.  Recommendations may be updated based on patient status, additional functional criteria and insurance authorization.  Follow Up Recommendations  Home health PT (vs SNF pending family's ability to care for pt) Can patient physically be transported by private  vehicle: Yes   Assistance Recommended at Discharge Intermittent Supervision/Assistance  Patient can return home with the following Help with stairs or ramp for entrance;Assist for transportation;Assistance with cooking/housework   Equipment Recommendations  None recommended by PT    Recommendations for Other Services       Precautions / Restrictions Precautions Precautions: Fall Restrictions Weight Bearing Restrictions: No     Mobility  Bed Mobility               General bed mobility comments: pt in chair upon arrival, ended session in chair    Transfers Overall transfer level: Needs assistance Equipment used: Rolling walker (2 wheels) Transfers: Sit to/from Stand Sit to Stand: Supervision           General transfer comment: supervision for line management and safety, pt properly utilizing arm rests to stand    Ambulation/Gait Ambulation/Gait assistance: Supervision Gait Distance (Feet): 200 Feet Assistive device: Rolling walker (2 wheels) Gait Pattern/deviations: Step-through pattern, Decreased stride length, Trunk flexed Gait velocity: decreased     General Gait Details: cued for posture with core engagement and forward gaze, with fatigue pt reverting to downward gaze with increased trunk flexion but correcting with verbal cues   Stairs             Wheelchair Mobility    Modified Rankin (Stroke Patients Only)       Balance Overall balance assessment: Needs assistance Sitting-balance support: Feet supported, No upper extremity supported Sitting balance-Leahy Scale: Good     Standing balance support: Bilateral upper extremity supported, During functional activity Standing balance-Leahy Scale: Fair Standing balance comment: utilizing RW for ambulation, standing without UE support  Cognition Arousal/Alertness: Awake/alert Behavior During Therapy: WFL for tasks assessed/performed Overall Cognitive  Status: Within Functional Limits for tasks assessed                                 General Comments: pt with decreased insight into her ability        Exercises Other Exercises Other Exercises: sit<>stand x5 reps, x2 sets for BLE strengthening and cueing for upright posture, seated rest breaks taken as needed    General Comments General comments (skin integrity, edema, etc.): SPO2 stable throughout on room air. Encouraged pt to continue to mobilize, daughter at bedside and asking about ambualting with pt, encouraged use of RW and having RN disconnect lines as able      Pertinent Vitals/Pain Pain Assessment Pain Assessment: No/denies pain    Home Living                          Prior Function            PT Goals (current goals can now be found in the care plan section) Acute Rehab PT Goals Patient Stated Goal: to improve strength, return to independence PT Goal Formulation: With patient Time For Goal Achievement: 07/17/22 Potential to Achieve Goals: Good Progress towards PT goals: Progressing toward goals    Frequency    Min 3X/week      PT Plan Discharge plan needs to be updated    Co-evaluation              AM-PAC PT "6 Clicks" Mobility   Outcome Measure  Help needed turning from your back to your side while in a flat bed without using bedrails?: A Little Help needed moving from lying on your back to sitting on the side of a flat bed without using bedrails?: A Little Help needed moving to and from a bed to a chair (including a wheelchair)?: A Little Help needed standing up from a chair using your arms (e.g., wheelchair or bedside chair)?: A Little Help needed to walk in hospital room?: A Little Help needed climbing 3-5 steps with a railing? : A Lot 6 Click Score: 17    End of Session   Activity Tolerance: Patient tolerated treatment well Patient left: in chair;with call bell/phone within reach;with chair alarm set;with  family/visitor present Nurse Communication: Mobility status PT Visit Diagnosis: Other abnormalities of gait and mobility (R26.89);Muscle weakness (generalized) (M62.81);Repeated falls (R29.6)     Time: [id number] PT Time Calculation (min) (ACUTE ONLY): 30 min  Charges:  $Gait Training: 8-22 mins $Therapeutic Activity: 8-22 mins                     Charlynne Cousins, PT DPT Acute Rehabilitation Services Office [phone]    Luvenia Heller 07/12/2022, 4:12 PM

## 2022-07-13 DIAGNOSIS — N10 Acute tubulo-interstitial nephritis: Secondary | ICD-10-CM | POA: Diagnosis not present

## 2022-07-13 LAB — BASIC METABOLIC PANEL
Anion gap: 11 (ref 5–15)
BUN: 45 mg/dL — ABNORMAL HIGH (ref 8–23)
CO2: 18 mmol/L — ABNORMAL LOW (ref 22–32)
Calcium: 9 mg/dL (ref 8.9–10.3)
Chloride: 109 mmol/L (ref 98–111)
Creatinine, Ser: 3.38 mg/dL — ABNORMAL HIGH (ref 0.44–1.00)
GFR, Estimated: 13 mL/min — ABNORMAL LOW (ref >60)
Glucose, Bld: 99 mg/dL (ref 70–99)
Potassium: 3.4 mmol/L — ABNORMAL LOW (ref 3.5–5.1)
Sodium: 138 mmol/L (ref 135–145)

## 2022-07-13 MED ORDER — ALBUMIN HUMAN 25 % IV SOLN
12.5000 g | Freq: Four times a day (QID) | INTRAVENOUS | Status: AC
  Administered 2022-07-13 – 2022-07-14 (×4): 12.5 g via INTRAVENOUS
  Filled 2022-07-13 (×4): qty 50

## 2022-07-13 MED ORDER — FUROSEMIDE 10 MG/ML IJ SOLN
40.0000 mg | Freq: Two times a day (BID) | INTRAMUSCULAR | Status: AC
  Administered 2022-07-13 – 2022-07-14 (×3): 40 mg via INTRAVENOUS
  Filled 2022-07-13 (×3): qty 4

## 2022-07-13 NOTE — Progress Notes (Signed)
PROGRESS NOTE    [name]  [id number] DOB: 1941/04/30 DOA: 07/02/2022 PCP: Cassandria Anger, MD   Brief Narrative:  [name] is a 82 y.o. female with a history of CKD stage IV, hypertension, rheumatoid arthritis, hypothyroidism, CAD s/p CABG, COPD, hyperlipidemia, DVT/PE. Patient presented secondary to weakness and fever and is found to have evidence of sepsis with a likely urinary source. Empiric antibiotics started and cultures obtained. Patient also found to have hyponatremia with associated dehydration and AKI. IV fluids started. Blood cultures significant for E. Coli bacteremia. Renal function improving with IV fluids. Workup significant for acute polynephritis/bacteremia, as well she did develop melena secondary to gastric/duodenal ulcers.   Assessment & Plan:   Principal Problem:   Acute pyelonephritis Active Problems:   Acute renal failure superimposed on stage 4 chronic kidney disease (HCC)   Acute metabolic encephalopathy   Hyponatremia   Sepsis with acute renal failure (HCC)   Acquired hypothyroidism   Essential hypertension   Rheumatoid arthritis (HCC)   Anticoagulated   Hx of CABG   Hyperlipidemia   COPD (chronic obstructive pulmonary disease) (HCC)   CKD (chronic kidney disease) stage 4, GFR 15-29 ml/min (HCC) - baseline SCr 2.5. follows with Dr. Royce Macadamia with [facility]   History of pulmonary embolism   Upper GI bleed   Melena   Metabolic acidosis, increased anion gap   Generalized weakness   DNR (do not resuscitate)/DNI(Do Not Intubate)   E coli bacteremia   Dark stools   Anemia   Acute gastric ulcer with hemorrhage   Duodenal ulcer   Sepsis due to acute hemorrhagic cystitis /right pyelonephritis with E. coli bacteremia Sepsis present on admission - Patient with non-specific symptoms but with associated fever and leukocytosis.  She was noted to have significant right flank pain/CVA tenderness on exam . Urinalysis suggests infection,  confirmed on urine culture. Patient started empirically on Ceftriaxone IV. Associated E. Coli bacteremia -on IV  Ceftriaxone 2 g IV, narrowed to IV cefazolin, now on IV Unasyn.  Patient is afebrile and and finally her leukocytosis is improving.  Checking CBC today.   Acute blood loss anemia Acute upper GI bleed due to gastric ulcer/duodenal ulcers -Developed melena 3/8, Eliquis has been held, GI input greatly appreciated, underwent EGD 07/08/2022, significant for gastric and duodenal ulcer, no evidence of active bleed, recommendation to hold anticoagulation for few days and for Protonix twice daily for 6 weeks and then once daily.  Continue to monitor CBC closely.  Hemoglobin is stable.  Will resume Eliquis 07/14/2022.  Hemoglobin 7.8.   Hyponatremia Sodium of 124 on admission. Likely related to dehydration and hypovolemia, present on admission.  -Resolved with IV hydration.   Acute metabolic encephalopathy  Likely related to overall illness but more specifically dehydration and hyponatremia. Patient is from independent living.  She is now alert and oriented.   Acute renal failure superimposed on stage 4 chronic kidney disease (HCC) Metabolic acidosis, increased anion gap - baseline SCr 2.5. follows with Dr. Royce Macadamia with [facility] Creatinine of 4.70 on admission secondary to poor oral intake in addition to patient continuing diuretic use.  Creatinine improved to 2.8 but then has been creeping up for last few days and improved somewhat today and currently 3.57.  Bicarb 18.  Continue bicarb tablets.  Renal ultrasound negative for obstruction.  She has +3 pitting edema bilateral lower extremity.  I gave her some albumin yesterday along with 1 dose of Lasix and now her creatinine has improved  further to 3.28 today.  I will repeat 4 more doses of albumin and 2 doses of Lasix today.  Repeat labs in the morning.    Essential hypertension Patient blood pressure is now fairly controlled on increased  dose of Coreg and home dose of amlodipine.  She is also on Lasix now.   Hypokalemia Resolved.   History of pulmonary embolism -Eliquis on hold due to GI bleed, per recommendations, will continue to hold for few more days and resume on 07/14/2022.   COPD (chronic obstructive pulmonary disease) (HCC) Stable. Asymptomatic.   Hyperlipidemia -Continue Lipitor daily   Hx of CABG Noted. No chest pain.   Long term (current) use of anticoagulants Noted. Patient is on Eliquis as an outpatient for the indication of PE/DVT which does not qualify for dose reduction, however patient is on reduced dose of 2.5 mg BID as an outpatient; possibly related to history of GI bleeding.   Rheumatoid arthritis ([place]) Patient is on [place] as an outpatient which is held secondary to active infection.   Acquired hypothyroidism -Continue Synthroid 88 mcg daily   Generalized weakness Secondary to acute illness. PT/OT recommending SNF.  Plan to discharge to SNF once medically stable.  Hopefully in 1 to 2 days.   Acute hypoxic respiratory failure Pneumonia Comfortable on room air.  Continue Unasyn.  DVT prophylaxis: SCDs Start: 07/03/22 0431   Code Status: DNR  Family Communication: Daughter present at bedside.  Plan of care discussed with patient in length and he/she verbalized understanding and agreed with it.  Status is: Inpatient Remains inpatient appropriate because: Needs IV albumin and Lasix.  Creatinine is still elevated.   Estimated body mass index is 29.64 kg/m as calculated from the following:   Height as of this encounter: '5\' 3"'$  (1.6 m).   Weight as of this encounter: 75.9 kg.    Nutritional Assessment: Body mass index is 29.64 kg/m.[name] Seen by dietician.  I agree with the assessment and plan as outlined below: Nutrition Status:        . Skin Assessment: I have examined the patient's skin and I agree with the wound assessment as performed by the wound care RN as outlined below:     Consultants:  GI  Procedures:  CT  Antimicrobials:  Anti-infectives (From admission, onward)    Start     Dose/Rate Route Frequency Ordered Stop   07/08/22 1245  ampicillin-sulbactam (UNASYN) 1.5 g in sodium chloride 0.9 % 100 mL IVPB        1.5 g 200 mL/hr over 30 Minutes Intravenous Every 12 hours 07/08/22 1154 07/13/22 2159   07/05/22 2000  ceFAZolin (ANCEF) IVPB 2g/100 mL premix  Status:  Discontinued        2 g 200 mL/hr over 30 Minutes Intravenous Every 12 hours 07/05/22 0958 07/05/22 1004   07/05/22 1530  ceFAZolin (ANCEF) IVPB 2g/100 mL premix  Status:  Discontinued        2 g 200 mL/hr over 30 Minutes Intravenous Every 12 hours 07/05/22 1004 07/08/22 1145   07/04/22 0000  cefTRIAXone (ROCEPHIN) 1 g in sodium chloride 0.9 % 100 mL IVPB  Status:  Discontinued        1 g 200 mL/hr over 30 Minutes Intravenous Every 24 hours 07/03/22 0211 07/03/22 1153   07/03/22 1600  cefTRIAXone (ROCEPHIN) 2 g in sodium chloride 0.9 % 100 mL IVPB  Status:  Discontinued        2 g 200 mL/hr over 30 Minutes Intravenous Every  24 hours 07/03/22 1153 07/05/22 0958   07/03/22 0215  cefTRIAXone (ROCEPHIN) 1 g in sodium chloride 0.9 % 100 mL IVPB        1 g 200 mL/hr over 30 Minutes Intravenous  Once 07/03/22 0212 07/03/22 0243   07/03/22 0145  vancomycin (VANCOREADY) IVPB 1250 mg/250 mL  Status:  Discontinued        1,250 mg 166.7 mL/hr over 90 Minutes Intravenous  Once 07/03/22 0132 07/03/22 0210   07/03/22 0130  ceFEPIme (MAXIPIME) 2 g in sodium chloride 0.9 % 100 mL IVPB  Status:  Discontinued        2 g 200 mL/hr over 30 Minutes Intravenous  Once 07/03/22 0124 07/03/22 0210   07/03/22 0130  metroNIDAZOLE (FLAGYL) IVPB 500 mg  Status:  Discontinued        500 mg 100 mL/hr over 60 Minutes Intravenous  Once 07/03/22 0124 07/03/22 0210   07/03/22 0130  vancomycin (VANCOCIN) IVPB 1000 mg/200 mL premix  Status:  Discontinued        1,000 mg 200 mL/hr over 60 Minutes Intravenous  Once 07/03/22  0124 07/03/22 0130         Subjective:  Patient seen and examined.  She has no complaints.  Daughter at the bedside.  Objective: Vitals:   07/13/22 0355 07/13/22 0420 07/13/22 0459 07/13/22 0904  BP: (!) 177/77   (!) 152/76  Pulse: 97 98  97  Resp: (!) 26 (!) 24  20  Temp:    98.2 F (36.8 C)  TempSrc:    Oral  SpO2: 95% 95%  94%  Weight:   75.9 kg   Height:        Intake/Output Summary (Last 24 hours) at 07/13/2022 1025 Last data filed at 07/13/2022 0720 Gross per 24 hour  Intake 1678.16 ml  Output 1050 ml  Net 628.16 ml    Filed Weights   07/08/22 0835 07/11/22 0500 07/13/22 0459  Weight: 69.3 kg 66.1 kg 75.9 kg    Examination:  General exam: Appears calm and comfortable  Respiratory system: Clear to auscultation. Respiratory effort normal. Cardiovascular system: S1 & S2 heard, RRR. No JVD, murmurs, rubs, gallops or clicks.  +3 pitting edema up lateral lower extremity. Gastrointestinal system: Abdomen is nondistended, soft and nontender. No organomegaly or masses felt. Normal bowel sounds heard. Central nervous system: Alert and oriented. No focal neurological deficits. Extremities: Symmetric 5 x 5 power. Skin: No rashes, lesions or ulcers.  Psychiatry: Judgement and insight appear normal. Mood & affect appropriate.   Data Reviewed: I have personally reviewed following labs and imaging studies  CBC: Recent Labs  Lab 07/08/22 0553 07/09/22 0345 07/10/22 0224 07/11/22 0330 07/12/22 0311  WBC 14.6* 15.7* 17.4* 18.2* 15.6*  HGB 9.1* 8.6* 8.7* 8.6* 7.8*  HCT 27.0* 25.6* 25.9* 25.4* 23.0*  MCV 91.5 91.8 91.8 92.4 92.0  PLT 275 297 337 342 [id number]    Basic Metabolic Panel: Recent Labs  Lab 07/09/22 0345 07/10/22 0224 07/11/22 0330 07/12/22 0311 07/13/22 0225  NA 137 136 137 139 138  K 3.6 3.7 4.2 3.9 3.4*  CL 108 105 108 110 109  CO2 19* 19* 17* 18* 18*  GLUCOSE 103* 103* 94 111* 99  BUN 55* 55* 55* 50* 45*  CREATININE 3.27* 3.48* 3.70* 3.57* 3.38*   CALCIUM 9.1 9.2 9.0 8.8* 9.0    GFR: Estimated Creatinine Clearance: 12.1 mL/min (A) (by C-G formula based on SCr of 3.38 mg/dL (H)). Liver Function Tests: No results  for input(s): "AST", "ALT", "ALKPHOS", "BILITOT", "PROT", "ALBUMIN" in the last 168 hours.  No results for input(s): "LIPASE", "AMYLASE" in the last 168 hours. No results for input(s): "AMMONIA" in the last 168 hours. Coagulation Profile: No results for input(s): "INR", "PROTIME" in the last 168 hours. Cardiac Enzymes: No results for input(s): "CKTOTAL", "CKMB", "CKMBINDEX", "TROPONINI" in the last 168 hours. BNP (last 3 results) No results for input(s): "PROBNP" in the last 8760 hours. HbA1C: No results for input(s): "HGBA1C" in the last 72 hours. CBG: No results for input(s): "GLUCAP" in the last 168 hours. Lipid Profile: No results for input(s): "CHOL", "HDL", "LDLCALC", "TRIG", "CHOLHDL", "LDLDIRECT" in the last 72 hours. Thyroid Function Tests: No results for input(s): "TSH", "T4TOTAL", "FREET4", "T3FREE", "THYROIDAB" in the last 72 hours. Anemia Panel: No results for input(s): "VITAMINB12", "FOLATE", "FERRITIN", "TIBC", "IRON", "RETICCTPCT" in the last 72 hours. Sepsis Labs: Recent Labs  Lab 07/12/22 0826  PROCALCITON 0.70     No results found for this or any previous visit (from the past 240 hour(s)).    Radiology Studies: DG CHEST PORT 1 VIEW  Result Date: 07/11/2022 CLINICAL DATA:  Hypoxia EXAM: PORTABLE CHEST 1 VIEW COMPARISON:  CXR 07/08/22 FINDINGS: Small bilateral pleural effusions. Status post median sternotomy. No pneumothorax. Bibasilar airspace opacities which could represent atelectasis or infection. Cardiac and mediastinal contours are unchanged. No radiographically apparent displaced rib fractures. Visualized upper abdomen is unremarkable. IMPRESSION: 1. Small bilateral pleural effusions. 2. Redemonstrated bibasilar airspace opacities, which could represent atelectasis or infection.  Electronically Signed   By: Marin Roberts M.D.   On: 07/11/2022 16:55    Scheduled Meds:  acidophilus  1 capsule Oral TID   amLODipine  10 mg Oral Daily   atorvastatin  80 mg Oral Daily   carvedilol  6.25 mg Oral BID WC   furosemide  40 mg Intravenous BID   levothyroxine  88 mcg Oral QAC breakfast   pantoprazole  40 mg Oral BID   sodium bicarbonate  650 mg Oral BID   triamcinolone  1 spray Each Nare Daily   Continuous Infusions:  albumin human     ampicillin-sulbactam (UNASYN) IV 1.5 g (07/13/22 1000)     LOS: 10 days   Darliss Cheney, MD [facility]  07/13/2022, 10:25 AM   *Please note that this is a verbal dictation therefore any spelling or grammatical errors are due to the "[place] One" system interpretation.  Please page via [place] and do not message via secure chat for urgent patient care matters. Secure chat can be used for non urgent patient care matters.  How to contact the [facility] Attending or Consulting provider [place] or covering provider during after hours [place], for this patient?  Check the care team in [facility] and look for a) attending/consulting TR[facility] provider listed and b) the [facility] team listed. Page or secure chat [phone]. Log into [email or web address] and use [place]'s universal password to access. If you do not have the password, please contact the hospital operator. Locate the [facility] provider you are looking for under [facility] and page to a number that you can be directly reached. If you still have difficulty reaching the provider, please page the [facility] (Director on Call) for the Hospitalists listed on amion for assistance.

## 2022-07-13 NOTE — TOC Progression Note (Signed)
Transition of Care [facility]) - Progression Note    Patient Details  Name: [name] MRN: [id number] Date of Birth: [birth year]  Transition of Care [facility]) CM/SW [place], LCSW Phone Number: 07/13/2022, 5:23 PM  Clinical Narrative:    CSW initiated insurance authorization process with [facility], Ref# [id number], for [facility].    Expected Discharge Plan: [place] Barriers to Discharge: Continued Medical Work up  Expected Discharge Plan and Services In-house Referral: Clinical Social Work     Living arrangements for the past 2 months: [place]                                       Social Determinants of Health (SDOH) Interventions SDOH Screenings   Food Insecurity: No Food Insecurity (06/13/2022)  Housing: Low Risk  (06/13/2022)  Transportation Needs: No Transportation Needs (06/13/2022)  Utilities: Not At Risk (06/13/2022)  Alcohol Screen: Low Risk  (05/30/2021)  Depression (PHQ2-9): Low Risk  (06/15/2022)  Financial Resource Strain: Low Risk  (06/13/2022)  Physical Activity: Inactive (06/13/2022)  Social Connections: Unknown (06/13/2022)  Stress: No Stress Concern Present (06/13/2022)  Tobacco Use: Medium Risk (07/11/2022)    Readmission Risk Interventions    07/05/2022    9:42 AM  Readmission Risk Prevention Plan  Transportation Screening Complete  Medication Review (RN Care Manager) Complete  PCP or Specialist appointment within 3-5 days of discharge Complete  HRI or [place] Complete  SW Recovery Care/Counseling Consult Complete  Palliative Care Screening Not Applicable  Skilled Nursing Facility Complete

## 2022-07-13 NOTE — Progress Notes (Signed)
Patient attached to O2 at 2lpm after she experienced shortness of breath while cleaning her up. She can't tolerate being on a flat position. She sat for sometime at the edge of the bed with oxygen on while making herself more comfortable before lying back to bed.

## 2022-07-14 ENCOUNTER — Encounter (HOSPITAL_COMMUNITY): Payer: Medicare PPO

## 2022-07-14 DIAGNOSIS — N10 Acute tubulo-interstitial nephritis: Secondary | ICD-10-CM | POA: Diagnosis not present

## 2022-07-14 LAB — CBC WITH DIFFERENTIAL/PLATELET
Abs Immature Granulocytes: 0.09 10*3/uL — ABNORMAL HIGH (ref 0.00–0.07)
Basophils Absolute: 0.2 10*3/uL — ABNORMAL HIGH (ref 0.0–0.1)
Basophils Relative: 2 %
Eosinophils Absolute: 0.3 10*3/uL (ref 0.0–0.5)
Eosinophils Relative: 2 %
HCT: 24.9 % — ABNORMAL LOW (ref 36.0–46.0)
Hemoglobin: 8.1 g/dL — ABNORMAL LOW (ref 12.0–15.0)
Immature Granulocytes: 1 %
Lymphocytes Relative: 9 %
Lymphs Abs: 1.3 10*3/uL (ref 0.7–4.0)
MCH: 31 pg (ref 26.0–34.0)
MCHC: 32.5 g/dL (ref 30.0–36.0)
MCV: 95.4 fL (ref 80.0–100.0)
Monocytes Absolute: 2.3 10*3/uL — ABNORMAL HIGH (ref 0.1–1.0)
Monocytes Relative: 16 %
Neutro Abs: 10.4 10*3/uL — ABNORMAL HIGH (ref 1.7–7.7)
Neutrophils Relative %: 70 %
Platelets: 375 10*3/uL (ref 150–400)
RBC: 2.61 MIL/uL — ABNORMAL LOW (ref 3.87–5.11)
RDW: 18.7 % — ABNORMAL HIGH (ref 11.5–15.5)
WBC: 14.7 10*3/uL — ABNORMAL HIGH (ref 4.0–10.5)
nRBC: 0.3 % — ABNORMAL HIGH (ref 0.0–0.2)

## 2022-07-14 LAB — COMPREHENSIVE METABOLIC PANEL
ALT: 5 U/L (ref 0–44)
AST: 13 U/L — ABNORMAL LOW (ref 15–41)
Albumin: 3.4 g/dL — ABNORMAL LOW (ref 3.5–5.0)
Alkaline Phosphatase: 88 U/L (ref 38–126)
Anion gap: 14 (ref 5–15)
BUN: 43 mg/dL — ABNORMAL HIGH (ref 8–23)
CO2: 19 mmol/L — ABNORMAL LOW (ref 22–32)
Calcium: 9.4 mg/dL (ref 8.9–10.3)
Chloride: 106 mmol/L (ref 98–111)
Creatinine, Ser: 3.29 mg/dL — ABNORMAL HIGH (ref 0.44–1.00)
GFR, Estimated: 13 mL/min — ABNORMAL LOW (ref >60)
Glucose, Bld: 124 mg/dL — ABNORMAL HIGH (ref 70–99)
Potassium: 3.6 mmol/L (ref 3.5–5.1)
Sodium: 139 mmol/L (ref 135–145)
Total Bilirubin: 0.9 mg/dL (ref 0.3–1.2)
Total Protein: 6.3 g/dL — ABNORMAL LOW (ref 6.5–8.1)

## 2022-07-14 MED ORDER — ALBUMIN HUMAN 25 % IV SOLN
12.5000 g | Freq: Four times a day (QID) | INTRAVENOUS | Status: DC
  Administered 2022-07-14 – 2022-07-15 (×5): 12.5 g via INTRAVENOUS
  Filled 2022-07-14 (×6): qty 50

## 2022-07-14 MED ORDER — FUROSEMIDE 10 MG/ML IJ SOLN
40.0000 mg | Freq: Two times a day (BID) | INTRAMUSCULAR | Status: AC
  Administered 2022-07-14 – 2022-07-15 (×3): 40 mg via INTRAVENOUS
  Filled 2022-07-14 (×3): qty 4

## 2022-07-14 MED ORDER — APIXABAN 2.5 MG PO TABS
2.5000 mg | ORAL_TABLET | Freq: Two times a day (BID) | ORAL | Status: DC
  Administered 2022-07-14 – 2022-07-18 (×9): 2.5 mg via ORAL
  Filled 2022-07-14 (×9): qty 1

## 2022-07-14 NOTE — Progress Notes (Signed)
PROGRESS NOTE    [name]  [id number] DOB: 1941-04-03 DOA: 07/02/2022 PCP: Cassandria Anger, MD   Brief Narrative:  [name] is a 82 y.o. female with a history of CKD stage IV, hypertension, rheumatoid arthritis, hypothyroidism, CAD s/p CABG, COPD, hyperlipidemia, DVT/PE. Patient presented secondary to weakness and fever and is found to have evidence of sepsis with a likely urinary source. Empiric antibiotics started and cultures obtained. Patient also found to have hyponatremia with associated dehydration and AKI. IV fluids started. Blood cultures significant for E. Coli bacteremia. Renal function improving with IV fluids. Workup significant for acute polynephritis/bacteremia, as well she did develop melena secondary to gastric/duodenal ulcers.   Assessment & Plan:   Principal Problem:   Acute pyelonephritis Active Problems:   Acute renal failure superimposed on stage 4 chronic kidney disease (HCC)   Acute metabolic encephalopathy   Hyponatremia   Sepsis with acute renal failure (HCC)   Acquired hypothyroidism   Essential hypertension   Rheumatoid arthritis (HCC)   Anticoagulated   Hx of CABG   Hyperlipidemia   COPD (chronic obstructive pulmonary disease) (HCC)   CKD (chronic kidney disease) stage 4, GFR 15-29 ml/min (HCC) - baseline SCr 2.5. follows with Dr. Royce Macadamia with [facility]   History of pulmonary embolism   Upper GI bleed   Melena   Metabolic acidosis, increased anion gap   Generalized weakness   DNR (do not resuscitate)/DNI(Do Not Intubate)   E coli bacteremia   Dark stools   Anemia   Acute gastric ulcer with hemorrhage   Duodenal ulcer   Sepsis due to acute hemorrhagic cystitis /right pyelonephritis with E. coli bacteremia Sepsis present on admission - Patient with non-specific symptoms but with associated fever and leukocytosis.  She was noted to have significant right flank pain/CVA tenderness on exam . Urinalysis suggests infection,  confirmed on urine culture. Patient started empirically on Ceftriaxone IV. Associated E. Coli bacteremia -on IV  Ceftriaxone 2 g IV, narrowed to IV cefazolin, now on IV Unasyn.  Patient is afebrile and and finally her leukocytosis is improving.  Checking CBC today.   Acute blood loss anemia Acute upper GI bleed due to gastric ulcer/duodenal ulcers -Developed melena 3/8, Eliquis has been held, GI input greatly appreciated, underwent EGD 07/08/2022, significant for gastric and duodenal ulcer, no evidence of active bleed, recommendation to hold anticoagulation for few days and for Protonix twice daily for 6 weeks and then once daily.  Continue to monitor CBC closely.  Hemoglobin is stable and currently 8.1 so I will resume Eliquis as planned.   Hyponatremia Sodium of 124 on admission. Likely related to dehydration and hypovolemia, present on admission.  -Resolved with IV hydration.   Acute metabolic encephalopathy  Likely related to overall illness but more specifically dehydration and hyponatremia. Patient is from independent living.  She is now alert and oriented.   Acute renal failure superimposed on stage 4 chronic kidney disease (HCC) Metabolic acidosis, increased anion gap - baseline SCr 2.5. follows with Dr. Royce Macadamia with [facility] Creatinine of 4.70 on admission secondary to poor oral intake in addition to patient continuing diuretic use.  Creatinine improved to 2.8 but then has been creeping up for last few days and improved somewhat today and currently 3.57.  Bicarb 18.  Continue bicarb tablets.  Renal ultrasound negative for obstruction.  She has +3 pitting edema bilateral lower extremity.  her creatinine has improved further to 3.29 today.  I will repeat 4 more doses of  albumin and 2 doses of Lasix today.  Repeat labs in the morning.    Essential hypertension Patient blood pressure is now fairly controlled on increased dose of Coreg and home dose of amlodipine.  She is also on Lasix  now.   Hypokalemia Resolved.   History of pulmonary embolism -Eliquis on hold due to GI bleed, per recommendations, we will now resume Eliquis today.   COPD (chronic obstructive pulmonary disease) (HCC) Stable. Asymptomatic.   Hyperlipidemia -Continue Lipitor daily   Hx of CABG Noted. No chest pain.   Long term (current) use of anticoagulants Noted. Patient is on Eliquis as an outpatient for the indication of PE/DVT which does not qualify for dose reduction, however patient is on reduced dose of 2.5 mg BID as an outpatient; possibly related to history of GI bleeding.   Rheumatoid arthritis ([place]) Patient is on [place] as an outpatient which is held secondary to active infection.   Acquired hypothyroidism -Continue Synthroid 88 mcg daily   Generalized weakness Secondary to acute illness. PT/OT recommending SNF.  Plan to discharge to SNF once medically stable.  Hopefully in 1 to 2 days.   Acute hypoxic respiratory failure Pneumonia Comfortable on room air.  Continue Unasyn.  Disposition: Patient's creatinine is improving and is close to her baseline.  She is very close to being medically stable for discharge, hopefully if her creatinine continues to improve, she will be ready for discharge in 1 to 2 days.  Acute hypoxic respiratory failure: Patient says that she feels short of breath while laying flat but not when sitting up or walking.  She has been desaturating in upper 80s while laying flat and is requiring 2 L of oxygen.  Lungs are clear to auscultation.  I will repeat chest x-ray today.  DVT prophylaxis: SCDs Start: 07/03/22 0431   Code Status: DNR  Family Communication: Daughter present at bedside.  Plan of care discussed with patient in length and he/she verbalized understanding and agreed with it.  Status is: Inpatient Remains inpatient appropriate because: Needs IV albumin and Lasix.  Creatinine is still elevated.   Estimated body mass index is 29.64 kg/m as  calculated from the following:   Height as of this encounter: 5\' 3"  (1.6 m).   Weight as of this encounter: 75.9 kg.    Nutritional Assessment: Body mass index is 29.64 kg/m.[name] Seen by dietician.  I agree with the assessment and plan as outlined below: Nutrition Status:        . Skin Assessment: I have examined the patient's skin and I agree with the wound assessment as performed by the wound care RN as outlined below:    Consultants:  GI  Procedures:  CT  Antimicrobials:  Anti-infectives (From admission, onward)    Start     Dose/Rate Route Frequency Ordered Stop   07/08/22 1245  ampicillin-sulbactam (UNASYN) 1.5 g in sodium chloride 0.9 % 100 mL IVPB        1.5 g 200 mL/hr over 30 Minutes Intravenous Every 12 hours 07/08/22 1154 07/13/22 1030   07/05/22 2000  ceFAZolin (ANCEF) IVPB 2g/100 mL premix  Status:  Discontinued        2 g 200 mL/hr over 30 Minutes Intravenous Every 12 hours 07/05/22 0958 07/05/22 1004   07/05/22 1530  ceFAZolin (ANCEF) IVPB 2g/100 mL premix  Status:  Discontinued        2 g 200 mL/hr over 30 Minutes Intravenous Every 12 hours 07/05/22 1004 07/08/22 1145   07/04/22  0000  cefTRIAXone (ROCEPHIN) 1 g in sodium chloride 0.9 % 100 mL IVPB  Status:  Discontinued        1 g 200 mL/hr over 30 Minutes Intravenous Every 24 hours 07/03/22 0211 07/03/22 1153   07/03/22 1600  cefTRIAXone (ROCEPHIN) 2 g in sodium chloride 0.9 % 100 mL IVPB  Status:  Discontinued        2 g 200 mL/hr over 30 Minutes Intravenous Every 24 hours 07/03/22 1153 07/05/22 0958   07/03/22 0215  cefTRIAXone (ROCEPHIN) 1 g in sodium chloride 0.9 % 100 mL IVPB        1 g 200 mL/hr over 30 Minutes Intravenous  Once 07/03/22 0212 07/03/22 0243   07/03/22 0145  vancomycin (VANCOREADY) IVPB 1250 mg/250 mL  Status:  Discontinued        1,250 mg 166.7 mL/hr over 90 Minutes Intravenous  Once 07/03/22 0132 07/03/22 0210   07/03/22 0130  ceFEPIme (MAXIPIME) 2 g in sodium chloride 0.9 % 100  mL IVPB  Status:  Discontinued        2 g 200 mL/hr over 30 Minutes Intravenous  Once 07/03/22 0124 07/03/22 0210   07/03/22 0130  metroNIDAZOLE (FLAGYL) IVPB 500 mg  Status:  Discontinued        500 mg 100 mL/hr over 60 Minutes Intravenous  Once 07/03/22 0124 07/03/22 0210   07/03/22 0130  vancomycin (VANCOCIN) IVPB 1000 mg/200 mL premix  Status:  Discontinued        1,000 mg 200 mL/hr over 60 Minutes Intravenous  Once 07/03/22 0124 07/03/22 0130         Subjective:  Patient seen and examined.  She has no complaints.  However she appears to be lethargic today.  She fell asleep while talking to me.  She denied any headache, chest pain, shortness of breath or any other complaint.  Objective: Vitals:   07/13/22 2000 07/13/22 2322 07/14/22 0340 07/14/22 0404  BP: (!) 151/61 (!) 157/72 (!) 163/77   Pulse: 87 91 97   Resp: (!) 22 (!) 24 (!) 21   Temp: 98.1 F (36.7 C) 98.2 F (36.8 C) 97.8 F (36.6 C)   TempSrc: Oral Oral Oral   SpO2: 92% 95% 92%   Weight:    75.9 kg  Height:        Intake/Output Summary (Last 24 hours) at 07/14/2022 0915 Last data filed at 07/13/2022 1510 Gross per 24 hour  Intake 206.53 ml  Output --  Net 206.53 ml    Filed Weights   07/11/22 0500 07/13/22 0459 07/14/22 0404  Weight: 66.1 kg 75.9 kg 75.9 kg    Examination:  General exam: Appears calm and comfortable but lethargic Respiratory system: Clear to auscultation. Respiratory effort normal. Cardiovascular system: S1 & S2 heard, RRR. No JVD, murmurs, rubs, gallops or clicks.  +2 edema bilateral lower extremity. Gastrointestinal system: Abdomen is nondistended, soft and nontender. No organomegaly or masses felt. Normal bowel sounds heard. Central nervous system: Alert and oriented. No focal neurological deficits. Extremities: Symmetric 5 x 5 power. Skin: No rashes, lesions or ulcers.  Psychiatry: Judgement and insight appear normal. Mood & affect appropriate.    Data Reviewed: I have  personally reviewed following labs and imaging studies  CBC: Recent Labs  Lab 07/09/22 0345 07/10/22 0224 07/11/22 0330 07/12/22 0311 07/14/22 0747  WBC 15.7* 17.4* 18.2* 15.6* 14.7*  NEUTROABS  --   --   --   --  10.4*  HGB 8.6* 8.7* 8.6*  7.8* 8.1*  HCT 25.6* 25.9* 25.4* 23.0* 24.9*  MCV 91.8 91.8 92.4 92.0 95.4  PLT 297 337 342 [id number]    Basic Metabolic Panel: Recent Labs  Lab 07/10/22 0224 07/11/22 0330 07/12/22 0311 07/13/22 0225 07/14/22 0747  NA 136 137 139 138 139  K 3.7 4.2 3.9 3.4* 3.6  CL 105 108 110 109 106  CO2 19* 17* 18* 18* 19*  GLUCOSE 103* 94 111* 99 124*  BUN 55* 55* 50* 45* 43*  CREATININE 3.48* 3.70* 3.57* 3.38* 3.29*  CALCIUM 9.2 9.0 8.8* 9.0 9.4    GFR: Estimated Creatinine Clearance: 12.4 mL/min (A) (by C-G formula based on SCr of 3.29 mg/dL (H)). Liver Function Tests: Recent Labs  Lab 07/14/22 0747  AST 13*  ALT 5  ALKPHOS 88  BILITOT 0.9  PROT 6.3*  ALBUMIN 3.4*    No results for input(s): "LIPASE", "AMYLASE" in the last 168 hours. No results for input(s): "AMMONIA" in the last 168 hours. Coagulation Profile: No results for input(s): "INR", "PROTIME" in the last 168 hours. Cardiac Enzymes: No results for input(s): "CKTOTAL", "CKMB", "CKMBINDEX", "TROPONINI" in the last 168 hours. BNP (last 3 results) No results for input(s): "PROBNP" in the last 8760 hours. HbA1C: No results for input(s): "HGBA1C" in the last 72 hours. CBG: No results for input(s): "GLUCAP" in the last 168 hours. Lipid Profile: No results for input(s): "CHOL", "HDL", "LDLCALC", "TRIG", "CHOLHDL", "LDLDIRECT" in the last 72 hours. Thyroid Function Tests: No results for input(s): "TSH", "T4TOTAL", "FREET4", "T3FREE", "THYROIDAB" in the last 72 hours. Anemia Panel: No results for input(s): "VITAMINB12", "FOLATE", "FERRITIN", "TIBC", "IRON", "RETICCTPCT" in the last 72 hours. Sepsis Labs: Recent Labs  Lab 07/12/22 0826  PROCALCITON 0.70     No results  found for this or any previous visit (from the past 240 hour(s)).    Radiology Studies: No results found.  Scheduled Meds:  acidophilus  1 capsule Oral TID   amLODipine  10 mg Oral Daily   atorvastatin  80 mg Oral Daily   carvedilol  6.25 mg Oral BID WC   furosemide  40 mg Intravenous BID   levothyroxine  88 mcg Oral QAC breakfast   pantoprazole  40 mg Oral BID   sodium bicarbonate  650 mg Oral BID   triamcinolone  1 spray Each Nare Daily   Continuous Infusions:  albumin human       LOS: 11 days   Darliss Cheney, MD [facility]  07/14/2022, 9:15 AM   *Please note that this is a verbal dictation therefore any spelling or grammatical errors are due to the "[place]" system interpretation.  Please page via [place] and do not message via secure chat for urgent patient care matters. Secure chat can be used for non urgent patient care matters.  How to contact the [facility] Attending or Consulting provider [place] or covering provider during after hours [place], for this patient?  Check the care team in [facility] and look for a) attending/consulting TRH provider listed and b) the [facility] team listed. Page or secure chat [phone]. Log into [email or web address] and use [place] universal password to access. If you do not have the password, please contact the hospital operator. Locate the [facility] provider you are looking for under [facility] and page to a number that you can be directly reached. If you still have difficulty reaching the provider, please page the [facility] (Director on Call) for the Hospitalists listed on amion for assistance.

## 2022-07-14 NOTE — Progress Notes (Signed)
Physical Therapy Treatment Patient Details Name: [name] MRN: [id number] DOB: May 10, 1940 Today's Date: 07/14/2022   History of Present Illness 82 y.o. female [facility] hospital [facility] with worsening weakness. Pt admitted for management of acute cystitis. PMH includes CKD IV, HTN, RA, hypothyroidism, CABG, COPD, HLD, PE.    PT Comments    Pt tolerated today's session well. Pt's daughter reporting difficulty with bed mobility during last session so educated pt on techniques today. Pt performed 2 trials of sit<>supine, once with use of bed rail and once without, pt cued for log roll technique and use of BUE, able to perform with minG due to cueing and line management, no physical assist required. Pt ambulated well, reporting fatigue with mobility but tolerating ~200 feet without a rest break, SPO2 stable on room air. Pt reports ambulating with her daughter yesterday, encouraged pt to continue to mobilize as able with family or staff for line management and supervision. Pt is continuing to progress well, acute PT will follow during admission to advance activity tolerance and mobility, discharge plans remain appropriate.     Recommendations for follow up therapy are one component of a multi-disciplinary discharge planning process, led by the attending physician.  Recommendations may be updated based on patient status, additional functional criteria and insurance authorization.  Follow Up Recommendations  Home health PT (vs SNF pending family's ability to provide support) Can patient physically be transported by private vehicle: Yes   Assistance Recommended at Discharge Intermittent Supervision/Assistance  Patient can return home with the following Help with stairs or ramp for entrance;Assist for transportation;Assistance with cooking/housework   Equipment Recommendations  None recommended by PT    Recommendations for Other Services       Precautions / Restrictions  Precautions Precautions: Fall Restrictions Weight Bearing Restrictions: No     Mobility  Bed Mobility Overal bed mobility: Needs Assistance Bed Mobility: Supine to Sit, Sit to Supine     Supine to sit: Min guard Sit to supine: Min guard   General bed mobility comments: 2 trials of bed mobility performed, one with use of bed rail and once without, HOB slightly elevated to simulate pt's 2 pillows she utilizes. MinG provided for safety and cueing for technique    Transfers Overall transfer level: Needs assistance Equipment used: Rolling walker (2 wheels) Transfers: Sit to/from Stand Sit to Stand: Supervision           General transfer comment: supervision for line management and safety, pt properly utilizing arm rests to stand    Ambulation/Gait Ambulation/Gait assistance: Supervision Gait Distance (Feet): 200 Feet Assistive device: Rolling walker (2 wheels) Gait Pattern/deviations: Step-through pattern, Decreased stride length, Trunk flexed Gait velocity: decreased     General Gait Details: cued for posture with core engagement and forward gaze, with fatigue pt reverting to downward gaze with increased trunk flexion but correcting with verbal cues. Encouraged increased stride length, occasionally performing   Stairs             Wheelchair Mobility    Modified Rankin (Stroke Patients Only)       Balance Overall balance assessment: Needs assistance Sitting-balance support: Feet supported, No upper extremity supported Sitting balance-Leahy Scale: Good Sitting balance - Comments: stable sitting balance   Standing balance support: Bilateral upper extremity supported, During functional activity Standing balance-Leahy Scale: Fair Standing balance comment: utilizing RW for ambulation, standing statically without UE support  Cognition Arousal/Alertness: Awake/alert Behavior During Therapy: WFL for tasks  assessed/performed Overall Cognitive Status: Within Functional Limits for tasks assessed Area of Impairment: Problem solving                             Problem Solving: Slow processing General Comments: pt with decreased insight into her ability        Exercises      General Comments General comments (skin integrity, edema, etc.): SPO2 stable on room air, remaining 92% or higher      Pertinent Vitals/Pain Pain Assessment Pain Assessment: No/denies pain    Home Living                          Prior Function            PT Goals (current goals can now be found in the care plan section) Acute Rehab PT Goals Patient Stated Goal: to improve strength, return to independence PT Goal Formulation: With patient Time For Goal Achievement: 07/17/22 Potential to Achieve Goals: Good Progress towards PT goals: Progressing toward goals    Frequency    Min 3X/week      PT Plan Current plan remains appropriate    Co-evaluation              AM-PAC PT "6 Clicks" Mobility   Outcome Measure  Help needed turning from your back to your side while in a flat bed without using bedrails?: A Little Help needed moving from lying on your back to sitting on the side of a flat bed without using bedrails?: A Little Help needed moving to and from a bed to a chair (including a wheelchair)?: A Little Help needed standing up from a chair using your arms (e.g., wheelchair or bedside chair)?: A Little Help needed to walk in hospital room?: A Little Help needed climbing 3-5 steps with a railing? : A Lot 6 Click Score: 17    End of Session Equipment Utilized During Treatment: Gait belt Activity Tolerance: Patient tolerated treatment well Patient left: in chair;with call bell/phone within reach;with chair alarm set Nurse Communication: Mobility status PT Visit Diagnosis: Other abnormalities of gait and mobility (R26.89);Muscle weakness (generalized) (M62.81);Repeated  falls (R29.6)     Time: [id number] PT Time Calculation (min) (ACUTE ONLY): 22 min  Charges:  $Therapeutic Activity: 8-22 mins                     Charlynne Cousins, PT DPT Acute Rehabilitation Services Office [phone]    [name] 07/14/2022, 3:01 PM

## 2022-07-14 NOTE — TOC Progression Note (Signed)
Transition of Care [facility]) - Progression Note    Patient Details  Name: [name] MRN: [id number] Date of Birth: 03/15/1941  Transition of [facility]) CM/SW [place], LCSW Phone Number: 07/14/2022, 5:06 PM  Clinical Narrative:    9am-Insurance requested updated MD note.  CSW uploaded note.  Per Joellen Jersey at [facility] SNF, if patient is not approved by insurance, family will just need to know that it would be an out of pocket private pay cost for SNF side, though they could appeal the denial later for a chance to overturn decision.   For weekend needs, contact [phone] ex. [phone]. Patient would be going to [facility].   Expected Discharge Plan: [place] Barriers to Discharge: Continued Medical Work up  Expected Discharge Plan and Services In-house Referral: Clinical Social Work     Living arrangements for the past 2 months: [place]                                       Social Determinants of Health (SDOH) Interventions SDOH Screenings   Food Insecurity: No Food Insecurity (06/13/2022)  Housing: Low Risk  (06/13/2022)  Transportation Needs: No Transportation Needs (06/13/2022)  Utilities: Not At Risk (06/13/2022)  Alcohol Screen: Low Risk  (05/30/2021)  Depression (PHQ2-9): Low Risk  (06/15/2022)  Financial Resource Strain: Low Risk  (06/13/2022)  Physical Activity: Inactive (06/13/2022)  Social Connections: Unknown (06/13/2022)  Stress: No Stress Concern Present (06/13/2022)  Tobacco Use: Medium Risk (07/11/2022)    Readmission Risk Interventions    07/05/2022    9:42 AM  Readmission Risk Prevention Plan  Transportation Screening Complete  Medication Review (RN Care Manager) Complete  PCP or Specialist appointment within 3-5 days of discharge Complete  HRI or [place] Complete  SW Recovery Care/Counseling Consult Complete  Palliative Care Screening Not Applicable  Skilled Nursing Facility  Complete

## 2022-07-14 NOTE — Progress Notes (Addendum)
Mobility Specialist Progress Note   07/14/22 1454  Mobility  Activity Ambulated with assistance in hallway  Level of Assistance Contact guard assist, steadying assist  Assistive Device Front wheel walker  Distance Ambulated (ft) 280 ft  Activity Response Tolerated well  Mobility Referral Yes  $Mobility charge 1 Mobility   Pre Mobility: 86 HR, 90% SpO2 on RA During Mobility: 101 HR, BP, 92% SpO2 on RA  Post Mobility: 93 HR, 94% SpO2 on RA   Received pt in chair asleep but easily aroused. Able to stand and ambulate in hallway w/ CGA. X2 standing rest break d/t SOB but SpO2 >91% throughout. Returned back to [facility] w/ call bell in reach and NT made aware.   [place] Falling Mobility Specialist Please contact via SecureChat or  Rehab office at [phone]

## 2022-07-15 ENCOUNTER — Inpatient Hospital Stay (HOSPITAL_COMMUNITY): Payer: Medicare PPO

## 2022-07-15 DIAGNOSIS — N10 Acute tubulo-interstitial nephritis: Secondary | ICD-10-CM | POA: Diagnosis not present

## 2022-07-15 LAB — BASIC METABOLIC PANEL
Anion gap: 12 (ref 5–15)
BUN: 45 mg/dL — ABNORMAL HIGH (ref 8–23)
CO2: 23 mmol/L (ref 22–32)
Calcium: 9 mg/dL (ref 8.9–10.3)
Chloride: 104 mmol/L (ref 98–111)
Creatinine, Ser: 3.32 mg/dL — ABNORMAL HIGH (ref 0.44–1.00)
GFR, Estimated: 13 mL/min — ABNORMAL LOW (ref >60)
Glucose, Bld: 106 mg/dL — ABNORMAL HIGH (ref 70–99)
Potassium: 3.3 mmol/L — ABNORMAL LOW (ref 3.5–5.1)
Sodium: 139 mmol/L (ref 135–145)

## 2022-07-15 MED ORDER — POTASSIUM CHLORIDE CRYS ER 20 MEQ PO TBCR
40.0000 meq | EXTENDED_RELEASE_TABLET | ORAL | Status: AC
  Administered 2022-07-15 (×2): 40 meq via ORAL
  Filled 2022-07-15 (×2): qty 2

## 2022-07-15 MED ORDER — QUETIAPINE FUMARATE 25 MG PO TABS
25.0000 mg | ORAL_TABLET | ORAL | Status: DC
  Administered 2022-07-15 – 2022-07-24 (×10): 25 mg via ORAL
  Filled 2022-07-15 (×10): qty 1

## 2022-07-15 NOTE — Progress Notes (Signed)
Occupational Therapy Treatment Patient Details Name: [name] MRN: [id number] DOB: [birth year] Today's Date: 07/15/2022   History of present illness 82 y.o. female presents to [facility] hospital on 07/02/2022 with worsening weakness. Pt admitted for management of acute cystitis. PMH includes CKD IV, HTN, RA, hypothyroidism, CABG, COPD, HLD, PE.   OT comments  Pt progressing towards established OT goals. Pt performing VOR exercises to decrease experience of diplopia and strengthen eye musculature. Pt provided double vision handout and educated regarding occlusion glasses and taping. Pt performing functional mobility into hall to optimize activity tolerance. Pt with decreased ability to self-pace becoming fatigued at end of session upon return to room, thus performing oral care seated. Will continue to follow.    Recommendations for follow up therapy are one component of a multi-disciplinary discharge planning process, led by the attending physician.  Recommendations may be updated based on patient status, additional functional criteria and insurance authorization.    Follow Up Recommendations  Home health OT (vs SNF depending on family ability to provide support)     Assistance Recommended at Discharge Intermittent Supervision/Assistance  Patient can return home with the following  A little help with walking and/or transfers;A little help with bathing/dressing/bathroom;Assistance with cooking/housework;Assist for transportation;Help with stairs or ramp for entrance   Equipment Recommendations  None recommended by OT    Recommendations for Other Services      Precautions / Restrictions Precautions Precautions: Fall Restrictions Weight Bearing Restrictions: No       Mobility Bed Mobility               General bed mobility comments: Recliner on arrival and departure    Transfers Overall transfer level: Needs assistance Equipment used: Rolling walker (2 wheels) Transfers: Sit  to/from Stand Sit to Stand: Supervision           General transfer comment: supervision for line management and safety, pt properly utilizing arm rests to stand     Balance Overall balance assessment: Needs assistance Sitting-balance support: Feet supported, No upper extremity supported Sitting balance-Leahy Scale: Good Sitting balance - Comments: stable sitting balance   Standing balance support: Bilateral upper extremity supported, During functional activity Standing balance-Leahy Scale: Fair Standing balance comment: utilizing RW for ambulation, standing statically without UE support                           ADL either performed or assessed with clinical judgement   ADL Overall ADL's : Needs assistance/impaired     Grooming: Oral care;Set up;Sitting Grooming Details (indicate cue type and reason): sitting due to over-exerting self walking in hall                 Toilet Transfer: Min guard;Ambulation;Rolling walker (2 wheels) Toilet Transfer Details (indicate cue type and reason): simulated in room         Functional mobility during ADLs: Supervision/safety;Rolling walker (2 wheels) General ADL Comments: Observed to veer L in hall with RW    Extremity/Trunk Assessment Upper Extremity Assessment Upper Extremity Assessment: Generalized weakness            Vision   Vision Assessment?: Vision impaired- to be further tested in functional context;Yes Additional Comments: Performing VOR exercises of horizontal scanning this session and convergence/divergence.   Perception     Praxis      Cognition Arousal/Alertness: Awake/alert Behavior During Therapy: WFL for tasks assessed/performed Overall Cognitive Status: Impaired/Different from baseline Area of Impairment: Problem solving  Problem Solving: Slow processing General Comments: pt with decreased insight into her abilities. Asked pt to walk into hallway  only distance she knew she could make it back to her room, and pt becominmg winded in hallway requiring two standing rest breaks        Exercises      Shoulder Instructions       General Comments      Pertinent Vitals/ Pain       Pain Assessment Pain Assessment: No/denies pain  Home Living                                          Prior Functioning/Environment              Frequency  Min 2X/week        Progress Toward Goals  OT Goals(current goals can now be found in the care plan section)  Progress towards OT goals: Progressing toward goals  Acute Rehab OT Goals Patient Stated Goal: move more OT Goal Formulation: With patient Time For Goal Achievement: 07/18/22 Potential to Achieve Goals: Good ADL Goals Pt Will Perform Upper Body Dressing: with modified independence;sitting  Plan Discharge plan remains appropriate    Co-evaluation                 AM-PAC OT "6 Clicks" Daily Activity     Outcome Measure   Help from another person eating meals?: None Help from another person taking care of personal grooming?: A Little Help from another person toileting, which includes using toliet, bedpan, or urinal?: A Little Help from another person bathing (including washing, rinsing, drying)?: A Little Help from another person to put on and taking off regular upper body clothing?: A Little Help from another person to put on and taking off regular lower body clothing?: A Little 6 Click Score: 19    End of Session Equipment Utilized During Treatment: Gait belt;Rolling walker (2 wheels)  OT Visit Diagnosis: Unsteadiness on feet (R26.81);Muscle weakness (generalized) (M62.81);Other abnormalities of gait and mobility (R26.89)   Activity Tolerance Patient tolerated treatment well   Patient Left in chair;with call bell/phone within reach;with chair alarm set;with family/visitor present   Nurse Communication Mobility status        Time:  1547-1630 OT Time Calculation (min): 43 min  Charges: OT General Charges $OT Visit: 1 Visit OT Treatments $Self Care/Home Management : 23-37 mins $Therapeutic Activity: 8-22 mins  Magnus Ivan, OTD, OTR/L [facility] Acute [facility] Office: [phone]   Magnus Ivan 07/15/2022, 5:28 PM

## 2022-07-15 NOTE — TOC Progression Note (Signed)
Transition of Care [facility]) - Progression Note    Patient Details  Name: [name] MRN: [id number] Date of Birth: October 04, 1940  Transition of Care [facility]) CM/SW Contact  [address], [place], [place] Phone Number: 07/15/2022, 12:14 PM  Clinical Narrative:     [employer] insurance portal checked-Insurance authorization pending at this time.  TOC to continue to follow up on status of SNF authorization.  Zarinah Oviatt, LCSW Transition of Care    Expected Discharge Plan: Skilled Nursing Facility Barriers to Discharge: Continued Medical Work up  Expected Discharge Plan and Services In-house Referral: Clinical Social Work     Living arrangements for the past 2 months: [place]                                       Social Determinants of Health (SDOH) Interventions SDOH Screenings   Food Insecurity: No Food Insecurity (06/13/2022)  Housing: Low Risk  (06/13/2022)  Transportation Needs: No Transportation Needs (06/13/2022)  Utilities: Not At Risk (06/13/2022)  Alcohol Screen: Low Risk  (05/30/2021)  Depression (PHQ2-9): Low Risk  (06/15/2022)  Financial Resource Strain: Low Risk  (06/13/2022)  Physical Activity: Inactive (06/13/2022)  Social Connections: Unknown (06/13/2022)  Stress: No Stress Concern Present (06/13/2022)  Tobacco Use: Medium Risk (07/11/2022)    Readmission Risk Interventions    07/05/2022    9:42 AM  Readmission Risk Prevention Plan  Transportation Screening Complete  Medication Review (RN Care Manager) Complete  PCP or Specialist appointment within 3-5 days of discharge Complete  HRI or [place] Complete  SW Recovery Care/Counseling Consult Complete  Palliative Care Screening Not Applicable  Skilled Nursing Facility Complete

## 2022-07-15 NOTE — Progress Notes (Signed)
PROGRESS NOTE    [name]  [id number] DOB: 09-Aug-1940 DOA: 07/02/2022 PCP: Cassandria Anger, MD   Brief Narrative:  [name] is a 82 y.o. female with a history of CKD stage IV, hypertension, rheumatoid arthritis, hypothyroidism, CAD s/p CABG, COPD, hyperlipidemia, DVT/PE. Patient presented secondary to weakness and fever and is found to have evidence of sepsis with a likely urinary source. Empiric antibiotics started and cultures obtained. Patient also found to have hyponatremia with associated dehydration and AKI. IV fluids started. Blood cultures significant for E. Coli bacteremia. Renal function improving with IV fluids. Workup significant for acute polynephritis/bacteremia, as well she did develop melena secondary to gastric/duodenal ulcers.   Assessment & Plan:   Principal Problem:   Acute pyelonephritis Active Problems:   Acute renal failure superimposed on stage 4 chronic kidney disease (HCC)   Acute metabolic encephalopathy   Hyponatremia   Sepsis with acute renal failure (HCC)   Acquired hypothyroidism   Essential hypertension   Rheumatoid arthritis (HCC)   Anticoagulated   Hx of CABG   Hyperlipidemia   COPD (chronic obstructive pulmonary disease) (HCC)   CKD (chronic kidney disease) stage 4, GFR 15-29 ml/min (HCC) - baseline SCr 2.5. follows with Dr. Royce Macadamia with [facility]   History of pulmonary embolism   Upper GI bleed   Melena   Metabolic acidosis, increased anion gap   Generalized weakness   DNR (do not resuscitate)/DNI(Do Not Intubate)   E coli bacteremia   Dark stools   Anemia   Acute gastric ulcer with hemorrhage   Duodenal ulcer   Sepsis due to acute hemorrhagic cystitis /right pyelonephritis with E. coli bacteremia Sepsis present on admission - Patient with non-specific symptoms but with associated fever and leukocytosis.  She was noted to have significant right flank pain/CVA tenderness on exam . Urinalysis suggests infection,  confirmed on urine culture. Patient started empirically on Ceftriaxone IV. Associated E. Coli bacteremia -on IV  Ceftriaxone 2 g IV, narrowed to IV cefazolin, then on Unasyn.  She completed total of 10 days of antibiotics   Acute blood loss anemia Acute upper GI bleed due to gastric ulcer/duodenal ulcers -Developed melena 3/8, Eliquis was held, GI input greatly appreciated, underwent EGD 07/08/2022, significant for gastric and duodenal ulcer, no evidence of active bleed, recommendation to hold anticoagulation for few days and for Protonix twice daily for 6 weeks and then once daily.  Continue to monitor CBC closely.  Hemoglobin is stable, Eliquis resumed on 07/14/2022.   Hyponatremia Sodium of 124 on admission. Likely related to dehydration and hypovolemia, present on admission.  -Resolved with IV hydration.   Acute metabolic encephalopathy  Likely related to overall illness but more specifically dehydration and hyponatremia. Patient is from independent living.  She is now alert and oriented.   Acute renal failure superimposed on stage 4 chronic kidney disease (HCC) Metabolic acidosis, increased anion gap - baseline SCr 2.5. follows with Dr. Royce Macadamia with [facility] Creatinine of 4.70 on admission secondary to poor oral intake in addition to patient continuing diuretic use.  Creatinine improved to 2.8 but then has been creeping up for last few days and improved somewhat today and currently 3.57.  Bicarb 23 today..  Continue bicarb tablets.  Renal ultrasound negative for obstruction.  Creatinine improved and appears to have plateaued around 3.3 range for last 3 days.  Due to edema in bilateral upper and lower extremities, I will continue to give her Lasix.  No more albumin today.  Repeat  labs in the morning.   Essential hypertension Patient blood pressure is now fairly controlled on increased dose of Coreg and home dose of amlodipine.  She is also on Lasix now.   Hypokalemia Low again today.   Will replace.   History of pulmonary embolism Eliquis resumed on 07/14/2022.   COPD (chronic obstructive pulmonary disease) (HCC) Stable. Asymptomatic.   Hyperlipidemia -Continue Lipitor daily   Hx of CABG Noted. No chest pain.   Long term (current) use of anticoagulants Noted. Patient is on Eliquis as an outpatient for the indication of PE/DVT which does not qualify for dose reduction, however patient is on reduced dose of 2.5 mg BID as an outpatient; possibly related to history of GI bleeding.   Rheumatoid arthritis ([place]) Patient is on [place] as an outpatient which is held secondary to active infection.   Acquired hypothyroidism -Continue Synthroid 88 mcg daily   Generalized weakness Secondary to acute illness. PT/OT recommending SNF.  Plan to discharge to SNF once insurance authorization approved.   Acute hypoxic respiratory failure Pneumonia Patient at times is requiring oxygen, especially when she is laying flat.  She is requiring 1 to 2 L of oxygen.  However she denies any shortness of breath when sitting up.  She has diminished breath sounds at bases, she has been advised multiple times and encouraged to use incentive spirometry but she is not following recommendations, she tends to forget.  I will repeat chest x-ray to make sure there is nothing else going on.  Will try to wean oxygen.  She completed antibiotics.  Disposition: Patient's creatinine is improving and is close to her baseline.  She is very close to being medically stable for discharge, hopefully if her creatinine continues to improve, she will be ready for discharge in 1 to 2 days.  DVT prophylaxis: Place TED hose Start: 07/15/22 0954 apixaban (ELIQUIS) tablet 2.5 mg Start: 07/14/22 1015 SCDs Start: 07/03/22 0431   Code Status: DNR  Family Communication: Daughter present at bedside.  Plan of care discussed with patient in length and he/she verbalized understanding and agreed with it.  Status is:  Inpatient Remains inpatient appropriate because: Needs another day of hospitalization to monitor creatinine while on IV Lasix.   Estimated body mass index is 29.64 kg/m as calculated from the following:   Height as of this encounter: 5\' 3"  (1.6 m).   Weight as of this encounter: 75.9 kg.    Nutritional Assessment: Body mass index is 29.64 kg/m.[name] Seen by dietician.  I agree with the assessment and plan as outlined below: Nutrition Status:        . Skin Assessment: I have examined the patient's skin and I agree with the wound assessment as performed by the wound care RN as outlined below:    Consultants:  GI  Procedures:  CT  Antimicrobials:  Anti-infectives (From admission, onward)    Start     Dose/Rate Route Frequency Ordered Stop   07/08/22 1245  ampicillin-sulbactam (UNASYN) 1.5 g in sodium chloride 0.9 % 100 mL IVPB        1.5 g 200 mL/hr over 30 Minutes Intravenous Every 12 hours 07/08/22 1154 07/13/22 1030   07/05/22 2000  ceFAZolin (ANCEF) IVPB 2g/100 mL premix  Status:  Discontinued        2 g 200 mL/hr over 30 Minutes Intravenous Every 12 hours 07/05/22 0958 07/05/22 1004   07/05/22 1530  ceFAZolin (ANCEF) IVPB 2g/100 mL premix  Status:  Discontinued  2 g 200 mL/hr over 30 Minutes Intravenous Every 12 hours 07/05/22 1004 07/08/22 1145   07/04/22 0000  cefTRIAXone (ROCEPHIN) 1 g in sodium chloride 0.9 % 100 mL IVPB  Status:  Discontinued        1 g 200 mL/hr over 30 Minutes Intravenous Every 24 hours 07/03/22 0211 07/03/22 1153   07/03/22 1600  cefTRIAXone (ROCEPHIN) 2 g in sodium chloride 0.9 % 100 mL IVPB  Status:  Discontinued        2 g 200 mL/hr over 30 Minutes Intravenous Every 24 hours 07/03/22 1153 07/05/22 0958   07/03/22 0215  cefTRIAXone (ROCEPHIN) 1 g in sodium chloride 0.9 % 100 mL IVPB        1 g 200 mL/hr over 30 Minutes Intravenous  Once 07/03/22 0212 07/03/22 0243   07/03/22 0145  vancomycin (VANCOREADY) IVPB 1250 mg/250 mL  Status:   Discontinued        1,250 mg 166.7 mL/hr over 90 Minutes Intravenous  Once 07/03/22 0132 07/03/22 0210   07/03/22 0130  ceFEPIme (MAXIPIME) 2 g in sodium chloride 0.9 % 100 mL IVPB  Status:  Discontinued        2 g 200 mL/hr over 30 Minutes Intravenous  Once 07/03/22 0124 07/03/22 0210   07/03/22 0130  metroNIDAZOLE (FLAGYL) IVPB 500 mg  Status:  Discontinued        500 mg 100 mL/hr over 60 Minutes Intravenous  Once 07/03/22 0124 07/03/22 0210   07/03/22 0130  vancomycin (VANCOCIN) IVPB 1000 mg/200 mL premix  Status:  Discontinued        1,000 mg 200 mL/hr over 60 Minutes Intravenous  Once 07/03/22 0124 07/03/22 0130         Subjective:  Patient seen and examined.  Does complain of shortness of breath when laying flat but otherwise she has no complaints.  Daughter at the bedside.  Objective: Vitals:   07/14/22 1938 07/14/22 2355 07/15/22 0400 07/15/22 0500  BP: (!) 127/48 124/61 (!) 158/65   Pulse: 91 87 88   Resp: 18 20 18    Temp: 97.8 F (36.6 C) 98.2 F (36.8 C) 98 F (36.7 C)   TempSrc: Oral Oral Oral   SpO2: 93% 95% 96%   Weight:    75.9 kg  Height:        Intake/Output Summary (Last 24 hours) at 07/15/2022 1100 Last data filed at 07/14/2022 1616 Gross per 24 hour  Intake 360 ml  Output --  Net 360 ml    Filed Weights   07/13/22 0459 07/14/22 0404 07/15/22 0500  Weight: 75.9 kg 75.9 kg 75.9 kg    Examination:  General exam: Appears calm and comfortable  Respiratory system: Limit breath sounds at the bases bilaterally. Respiratory effort normal. Cardiovascular system: S1 & S2 heard, RRR. No JVD, murmurs, rubs, gallops or clicks.  +3 pitting edema bilateral lower extremity and +1 pitting edema bilateral upper extremities Gastrointestinal system: Abdomen is nondistended, soft and nontender. No organomegaly or masses felt. Normal bowel sounds heard. Central nervous system: Alert and oriented. No focal neurological deficits. Extremities: Symmetric 5 x 5  power. Skin: No rashes, lesions or ulcers.  Psychiatry: Judgement and insight appear normal. Mood & affect appropriate.    Data Reviewed: I have personally reviewed following labs and imaging studies  CBC: Recent Labs  Lab 07/09/22 0345 07/10/22 0224 07/11/22 0330 07/12/22 0311 07/14/22 0747  WBC 15.7* 17.4* 18.2* 15.6* 14.7*  NEUTROABS  --   --   --   --  10.4*  HGB 8.6* 8.7* 8.6* 7.8* 8.1*  HCT 25.6* 25.9* 25.4* 23.0* 24.9*  MCV 91.8 91.8 92.4 92.0 95.4  PLT 297 337 342 [id number]    Basic Metabolic Panel: Recent Labs  Lab 07/11/22 0330 07/12/22 0311 07/13/22 0225 07/14/22 0747 07/15/22 0337  NA 137 139 138 139 139  K 4.2 3.9 3.4* 3.6 3.3*  CL 108 110 109 106 104  CO2 17* 18* 18* 19* 23  GLUCOSE 94 111* 99 124* 106*  BUN 55* 50* 45* 43* 45*  CREATININE 3.70* 3.57* 3.38* 3.29* 3.32*  CALCIUM 9.0 8.8* 9.0 9.4 9.0    GFR: Estimated Creatinine Clearance: 12.3 mL/min (A) (by C-G formula based on SCr of 3.32 mg/dL (H)). Liver Function Tests: Recent Labs  Lab 07/14/22 0747  AST 13*  ALT 5  ALKPHOS 88  BILITOT 0.9  PROT 6.3*  ALBUMIN 3.4*    No results for input(s): "LIPASE", "AMYLASE" in the last 168 hours. No results for input(s): "AMMONIA" in the last 168 hours. Coagulation Profile: No results for input(s): "INR", "PROTIME" in the last 168 hours. Cardiac Enzymes: No results for input(s): "CKTOTAL", "CKMB", "CKMBINDEX", "TROPONINI" in the last 168 hours. BNP (last 3 results) No results for input(s): "PROBNP" in the last 8760 hours. HbA1C: No results for input(s): "HGBA1C" in the last 72 hours. CBG: No results for input(s): "GLUCAP" in the last 168 hours. Lipid Profile: No results for input(s): "CHOL", "HDL", "LDLCALC", "TRIG", "CHOLHDL", "LDLDIRECT" in the last 72 hours. Thyroid Function Tests: No results for input(s): "TSH", "T4TOTAL", "FREET4", "T3FREE", "THYROIDAB" in the last 72 hours. Anemia Panel: No results for input(s): "VITAMINB12", "FOLATE",  "FERRITIN", "TIBC", "IRON", "RETICCTPCT" in the last 72 hours. Sepsis Labs: Recent Labs  Lab 07/12/22 0826  PROCALCITON 0.70     No results found for this or any previous visit (from the past 240 hour(s)).    Radiology Studies: No results found.  Scheduled Meds:  acidophilus  1 capsule Oral TID   amLODipine  10 mg Oral Daily   apixaban  2.5 mg Oral BID   atorvastatin  80 mg Oral Daily   carvedilol  6.25 mg Oral BID WC   levothyroxine  88 mcg Oral QAC breakfast   pantoprazole  40 mg Oral BID   sodium bicarbonate  650 mg Oral BID   triamcinolone  1 spray Each Nare Daily   Continuous Infusions:  albumin human 12.5 g (07/15/22 0908)     LOS: 12 days   Darliss Cheney, MD [facility]  07/15/2022, 11:00 AM   *Please note that this is a verbal dictation therefore any spelling or grammatical errors are due to the "[place] [facility]" system interpretation.  Please page via [place] and do not message via secure chat for urgent patient care matters. Secure chat can be used for non urgent patient care matters.  How to contact the [facility] Attending or Consulting provider [place] or covering provider during after hours [place], for this patient?  Check the care team in [facility] and look for a) attending/consulting TRH provider listed and b) the [facility] team listed. Page or secure chat [phone]. Log into [email or web address] and use [place]'s universal password to access. If you do not have the password, please contact the hospital operator. Locate the [facility] provider you are looking for under [facility] and page to a number that you can be directly reached. If you still have difficulty reaching the provider, please page the [facility] (Director on Call) for  the Hospitalists listed on amion for assistance.

## 2022-07-16 DIAGNOSIS — N10 Acute tubulo-interstitial nephritis: Secondary | ICD-10-CM | POA: Diagnosis not present

## 2022-07-16 LAB — PROCALCITONIN: Procalcitonin: 0.18 ng/mL

## 2022-07-16 LAB — BASIC METABOLIC PANEL
Anion gap: 14 (ref 5–15)
BUN: 44 mg/dL — ABNORMAL HIGH (ref 8–23)
CO2: 22 mmol/L (ref 22–32)
Calcium: 9.7 mg/dL (ref 8.9–10.3)
Chloride: 106 mmol/L (ref 98–111)
Creatinine, Ser: 3.4 mg/dL — ABNORMAL HIGH (ref 0.44–1.00)
GFR, Estimated: 13 mL/min — ABNORMAL LOW (ref >60)
Glucose, Bld: 108 mg/dL — ABNORMAL HIGH (ref 70–99)
Potassium: 3.9 mmol/L (ref 3.5–5.1)
Sodium: 142 mmol/L (ref 135–145)

## 2022-07-16 NOTE — TOC Progression Note (Signed)
Transition of Care [facility]) - Progression Note    Patient Details  Name: [name] MRN: [id number] Date of Birth: 04/22/1941  Transition of Care [facility]) CM/SW Contact  [address] [place], [place] Phone Number: 07/16/2022, 11:01 AM  Clinical Narrative:     [place] insurance portal checked-Insurance authorization pending at this time.  TOC to continue to follow up on status of SNF authorization.  Raneisha Bress, LCSW Transition of Care       Expected Discharge Plan: Skilled Nursing Facility Barriers to Discharge: Continued Medical Work up  Expected Discharge Plan and Services In-house Referral: Clinical Social Work     Living arrangements for the past 2 months: [place]                                       Social Determinants of Health (SDOH) Interventions SDOH Screenings   Food Insecurity: No Food Insecurity (06/13/2022)  Housing: Low Risk  (06/13/2022)  Transportation Needs: No Transportation Needs (06/13/2022)  Utilities: Not At Risk (06/13/2022)  Alcohol Screen: Low Risk  (05/30/2021)  Depression (PHQ2-9): Low Risk  (06/15/2022)  Financial Resource Strain: Low Risk  (06/13/2022)  Physical Activity: Inactive (06/13/2022)  Social Connections: Unknown (06/13/2022)  Stress: No Stress Concern Present (06/13/2022)  Tobacco Use: Medium Risk (07/11/2022)    Readmission Risk Interventions    07/05/2022    9:42 AM  Readmission Risk Prevention Plan  Transportation Screening Complete  Medication Review (RN Care Manager) Complete  PCP or Specialist appointment within 3-5 days of discharge Complete  HRI or [place] Complete  SW Recovery Care/Counseling Consult Complete  Palliative Care Screening Not Applicable  Skilled Nursing Facility Complete

## 2022-07-16 NOTE — Progress Notes (Signed)
PROGRESS NOTE    [name]  [id number] DOB: 10/25/1940 DOA: 07/02/2022 PCP: Cassandria Anger, MD   Brief Narrative:  [name] is a 82 y.o. female with a history of CKD stage IV, hypertension, rheumatoid arthritis, hypothyroidism, CAD s/p CABG, COPD, hyperlipidemia, DVT/PE. Patient presented secondary to weakness and fever and is found to have evidence of sepsis with a likely urinary source. Empiric antibiotics started and cultures obtained. Patient also found to have hyponatremia with associated dehydration and AKI. IV fluids started. Blood cultures significant for E. Coli bacteremia. Renal function improving with IV fluids. Workup significant for acute polynephritis/bacteremia, as well she did develop melena secondary to gastric/duodenal ulcers.   Assessment & Plan:   Principal Problem:   Acute pyelonephritis Active Problems:   Acute renal failure superimposed on stage 4 chronic kidney disease (HCC)   Acute metabolic encephalopathy   Hyponatremia   Sepsis with acute renal failure (HCC)   Acquired hypothyroidism   Essential hypertension   Rheumatoid arthritis (HCC)   Anticoagulated   Hx of CABG   Hyperlipidemia   COPD (chronic obstructive pulmonary disease) (HCC)   CKD (chronic kidney disease) stage 4, GFR 15-29 ml/min (HCC) - baseline SCr 2.5. follows with Dr. Royce Macadamia with [facility]   History of pulmonary embolism   Upper GI bleed   Melena   Metabolic acidosis, increased anion gap   Generalized weakness   DNR (do not resuscitate)/DNI(Do Not Intubate)   E coli bacteremia   Dark stools   Anemia   Acute gastric ulcer with hemorrhage   Duodenal ulcer   Sepsis due to acute hemorrhagic cystitis /right pyelonephritis with E. coli bacteremia Sepsis present on admission - Patient with non-specific symptoms but with associated fever and leukocytosis.  She was noted to have significant right flank pain/CVA tenderness on exam . Urinalysis suggests infection,  confirmed on urine culture. Patient started empirically on Ceftriaxone IV. Associated E. Coli bacteremia -on IV  Ceftriaxone 2 g IV, narrowed to IV cefazolin, then on Unasyn.  She completed total of 10 days of antibiotics   Acute blood loss anemia Acute upper GI bleed due to gastric ulcer/duodenal ulcers -Developed melena 3/8, Eliquis was held, GI input greatly appreciated, underwent EGD 07/08/2022, significant for gastric and duodenal ulcer, no evidence of active bleed, recommendation to hold anticoagulation for few days and for Protonix twice daily for 6 weeks and then once daily.  Continue to monitor CBC closely.  Hemoglobin is stable, Eliquis resumed on 07/14/2022.   Hyponatremia Sodium of 124 on admission. Likely related to dehydration and hypovolemia, present on admission.  -Resolved with IV hydration.   Acute metabolic encephalopathy  Likely related to overall illness but more specifically dehydration and hyponatremia. Patient is from independent living.  She is now alert and oriented.   Acute renal failure superimposed on stage 4 chronic kidney disease (HCC) Metabolic acidosis, increased anion gap - baseline SCr 2.5. follows with Dr. Royce Macadamia with [facility] Creatinine of 4.70 on admission secondary to poor oral intake in addition to patient continuing diuretic use.  Creatinine improved to 2.8 but then has been creeping up for last few days and improved somewhat today and currently 3.57.  Bicarb 23 today..  Continue bicarb tablets.  Renal ultrasound negative for obstruction.  Creatinine improved and appears to have plateaued around 3.3 range for last 3 days, currently 3.4..  Due to edema in bilateral upper and lower extremities, I will continue to give her Lasix.  Unfortunately, output is not  charted.  Essential hypertension Patient blood pressure is now fairly controlled on increased dose of Coreg and home dose of amlodipine.  She is also on Lasix now.   Hypokalemia Low again today.   Resolved.   History of pulmonary embolism Eliquis resumed on 07/14/2022.   COPD (chronic obstructive pulmonary disease) (HCC) Stable. Asymptomatic.   Hyperlipidemia -Continue Lipitor daily   Hx of CABG Noted. No chest pain.   Long term (current) use of anticoagulants Noted. Patient is on Eliquis as an outpatient for the indication of PE/DVT which does not qualify for dose reduction, however patient is on reduced dose of 2.5 mg BID as an outpatient; possibly related to history of GI bleeding.   Rheumatoid arthritis ([place]) Patient is on [place] as an outpatient which is held secondary to active infection.   Acquired hypothyroidism -Continue Synthroid 88 mcg daily   Generalized weakness Secondary to acute illness. PT/OT recommending SNF.  Plan to discharge to SNF once insurance authorization approved.   Acute hypoxic respiratory failure Pneumonia Patient at times is requiring oxygen, especially when she is laying flat.  She is requiring 1 to 2 L of oxygen.  However she denies any shortness of breath when sitting up.  She has diminished breath sounds at bases, she has been advised multiple times and encouraged to use incentive spirometry but she is not following recommendations, she tends to forget.  Repeat chest x-ray on 07/15/2022 shows atelectasis as well and procalcitonin unremarkable indicates likely atelectasis clinically as well.  Disposition: Waiting for insurance authorization.  DVT prophylaxis: Place TED hose Start: 07/15/22 0954 apixaban (ELIQUIS) tablet 2.5 mg Start: 07/14/22 1015 SCDs Start: 07/03/22 0431   Code Status: DNR  Family Communication: Daughter and husband present at bedside.  Plan of care discussed with patient in length and he/she verbalized understanding and agreed with it.  Status is: Inpatient Remains inpatient appropriate because: Medically stable.  Pending insurance authorization.  Although will benefit from 1 more day of IV diuresis.   Estimated body  mass index is 29.48 kg/m as calculated from the following:   Height as of this encounter: 5\' 3"  (1.6 m).   Weight as of this encounter: 75.5 kg.    Nutritional Assessment: Body mass index is 29.48 kg/m.[name] Seen by dietician.  I agree with the assessment and plan as outlined below: Nutrition Status:        . Skin Assessment: I have examined the patient's skin and I agree with the wound assessment as performed by the wound care RN as outlined below:    Consultants:  GI  Procedures:  CT  Antimicrobials:  Anti-infectives (From admission, onward)    Start     Dose/Rate Route Frequency Ordered Stop   07/08/22 1245  ampicillin-sulbactam (UNASYN) 1.5 g in sodium chloride 0.9 % 100 mL IVPB        1.5 g 200 mL/hr over 30 Minutes Intravenous Every 12 hours 07/08/22 1154 07/13/22 1030   07/05/22 2000  ceFAZolin (ANCEF) IVPB 2g/100 mL premix  Status:  Discontinued        2 g 200 mL/hr over 30 Minutes Intravenous Every 12 hours 07/05/22 0958 07/05/22 1004   07/05/22 1530  ceFAZolin (ANCEF) IVPB 2g/100 mL premix  Status:  Discontinued        2 g 200 mL/hr over 30 Minutes Intravenous Every 12 hours 07/05/22 1004 07/08/22 1145   07/04/22 0000  cefTRIAXone (ROCEPHIN) 1 g in sodium chloride 0.9 % 100 mL IVPB  Status:  Discontinued  1 g 200 mL/hr over 30 Minutes Intravenous Every 24 hours 07/03/22 0211 07/03/22 1153   07/03/22 1600  cefTRIAXone (ROCEPHIN) 2 g in sodium chloride 0.9 % 100 mL IVPB  Status:  Discontinued        2 g 200 mL/hr over 30 Minutes Intravenous Every 24 hours 07/03/22 1153 07/05/22 0958   07/03/22 0215  cefTRIAXone (ROCEPHIN) 1 g in sodium chloride 0.9 % 100 mL IVPB        1 g 200 mL/hr over 30 Minutes Intravenous  Once 07/03/22 0212 07/03/22 0243   07/03/22 0145  vancomycin (VANCOREADY) IVPB 1250 mg/250 mL  Status:  Discontinued        1,250 mg 166.7 mL/hr over 90 Minutes Intravenous  Once 07/03/22 0132 07/03/22 0210   07/03/22 0130  ceFEPIme (MAXIPIME) 2 g  in sodium chloride 0.9 % 100 mL IVPB  Status:  Discontinued        2 g 200 mL/hr over 30 Minutes Intravenous  Once 07/03/22 0124 07/03/22 0210   07/03/22 0130  metroNIDAZOLE (FLAGYL) IVPB 500 mg  Status:  Discontinued        500 mg 100 mL/hr over 60 Minutes Intravenous  Once 07/03/22 0124 07/03/22 0210   07/03/22 0130  vancomycin (VANCOCIN) IVPB 1000 mg/200 mL premix  Status:  Discontinued        1,000 mg 200 mL/hr over 60 Minutes Intravenous  Once 07/03/22 0124 07/03/22 0130         Subjective:  Seen and examined.  Daughter and husband at the bedside.  Patient feels well.  No shortness of breath or any other complaint.  Objective: Vitals:   07/15/22 2323 07/16/22 0406 07/16/22 0500 07/16/22 0823  BP: (!) 161/60 (!) 152/77  (!) 154/60  Pulse: 90 88  95  Resp: 20 20  20   Temp: 97.8 F (36.6 C) 97.6 F (36.4 C)  98.1 F (36.7 C)  TempSrc: Oral Oral  Oral  SpO2: 92% 94%  90%  Weight:   75.5 kg   Height:       No intake or output data in the 24 hours ending 07/16/22 1032  Filed Weights   07/14/22 0404 07/15/22 0500 07/16/22 0500  Weight: 75.9 kg 75.9 kg 75.5 kg    Examination:  General exam: Appears calm and comfortable  Respiratory system: Clear to auscultation. Respiratory effort normal. Cardiovascular system: S1 & S2 heard, RRR. No JVD, murmurs, rubs, gallops or clicks.  +3 pitting edema bilateral lower extremity, +1-2 pitting edema bilateral upper extremity. Gastrointestinal system: Abdomen is nondistended, soft and nontender. No organomegaly or masses felt. Normal bowel sounds heard. Central nervous system: Alert and oriented. No focal neurological deficits. Extremities: Symmetric 5 x 5 power. Skin: No rashes, lesions or ulcers.  Psychiatry: Judgement and insight appear normal. Mood & affect appropriate.    Data Reviewed: I have personally reviewed following labs and imaging studies  CBC: Recent Labs  Lab 07/10/22 0224 07/11/22 0330 07/12/22 0311  07/14/22 0747  WBC 17.4* 18.2* 15.6* 14.7*  NEUTROABS  --   --   --  10.4*  HGB 8.7* 8.6* 7.8* 8.1*  HCT 25.9* 25.4* 23.0* 24.9*  MCV 91.8 92.4 92.0 95.4  PLT 337 342 [id number]    Basic Metabolic Panel: Recent Labs  Lab 07/12/22 0311 07/13/22 0225 07/14/22 0747 07/15/22 0337 07/16/22 0759  NA 139 138 139 139 142  K 3.9 3.4* 3.6 3.3* 3.9  CL 110 109 106 104 106  CO2 18* 18*  19* 23 22  GLUCOSE 111* 99 124* 106* 108*  BUN 50* 45* 43* 45* 44*  CREATININE 3.57* 3.38* 3.29* 3.32* 3.40*  CALCIUM 8.8* 9.0 9.4 9.0 9.7    GFR: Estimated Creatinine Clearance: 12 mL/min (A) (by C-G formula based on SCr of 3.4 mg/dL (H)). Liver Function Tests: Recent Labs  Lab 07/14/22 0747  AST 13*  ALT 5  ALKPHOS 88  BILITOT 0.9  PROT 6.3*  ALBUMIN 3.4*    No results for input(s): "LIPASE", "AMYLASE" in the last 168 hours. No results for input(s): "AMMONIA" in the last 168 hours. Coagulation Profile: No results for input(s): "INR", "PROTIME" in the last 168 hours. Cardiac Enzymes: No results for input(s): "CKTOTAL", "CKMB", "CKMBINDEX", "TROPONINI" in the last 168 hours. BNP (last 3 results) No results for input(s): "PROBNP" in the last 8760 hours. HbA1C: No results for input(s): "HGBA1C" in the last 72 hours. CBG: No results for input(s): "GLUCAP" in the last 168 hours. Lipid Profile: No results for input(s): "CHOL", "HDL", "LDLCALC", "TRIG", "CHOLHDL", "LDLDIRECT" in the last 72 hours. Thyroid Function Tests: No results for input(s): "TSH", "T4TOTAL", "FREET4", "T3FREE", "THYROIDAB" in the last 72 hours. Anemia Panel: No results for input(s): "VITAMINB12", "FOLATE", "FERRITIN", "TIBC", "IRON", "RETICCTPCT" in the last 72 hours. Sepsis Labs: Recent Labs  Lab 07/12/22 0826 07/16/22 0759  PROCALCITON 0.70 0.18     No results found for this or any previous visit (from the past 240 hour(s)).    Radiology Studies: DG CHEST PORT 1 VIEW  Result Date: 07/15/2022 CLINICAL DATA:   Hypoxia EXAM: PORTABLE CHEST 1 VIEW COMPARISON:  07/11/2022 FINDINGS: Prior CABG. Heart is borderline in size. Mediastinal contours within normal limits. Bilateral lower lobe airspace opacities have increased since prior study. Small bilateral effusions. No acute bony abnormality. IMPRESSION: Worsening bibasilar atelectasis or infiltrates with small effusions. Electronically Signed   By: Rolm Baptise M.D.   On: 07/15/2022 20:13    Scheduled Meds:  acidophilus  1 capsule Oral TID   amLODipine  10 mg Oral Daily   apixaban  2.5 mg Oral BID   atorvastatin  80 mg Oral Daily   carvedilol  6.25 mg Oral BID WC   levothyroxine  88 mcg Oral QAC breakfast   pantoprazole  40 mg Oral BID   QUEtiapine  25 mg Oral Q24H   sodium bicarbonate  650 mg Oral BID   triamcinolone  1 spray Each Nare Daily   Continuous Infusions:     LOS: 13 days   Darliss Cheney, MD [facility]  07/16/2022, 10:32 AM   *Please note that this is a verbal dictation therefore any spelling or grammatical errors are due to the "[place] [employer]" system interpretation.  Please page via [place] and do not message via secure chat for urgent patient care matters. Secure chat can be used for non urgent patient care matters.  How to contact the [facility] Attending or Consulting provider [place] or covering provider during after hours [place], for this patient?  Check the care team in [facility] and look for a) attending/consulting [facility] provider listed and b) the [facility] team listed. Page or secure chat [phone]. Log into [email or web address] and use [place]'s universal password to access. If you do not have the password, please contact the hospital operator. Locate the [facility] provider you are looking for under [facility] and page to a number that you can be directly reached. If you still have difficulty reaching the provider, please page the [facility] (Director  on Call) for the Hospitalists listed on amion for assistance.

## 2022-07-17 DIAGNOSIS — N10 Acute tubulo-interstitial nephritis: Secondary | ICD-10-CM | POA: Diagnosis not present

## 2022-07-17 LAB — CBC WITH DIFFERENTIAL/PLATELET
Abs Immature Granulocytes: 0.07 10*3/uL (ref 0.00–0.07)
Basophils Absolute: 0.3 10*3/uL — ABNORMAL HIGH (ref 0.0–0.1)
Basophils Relative: 2 %
Eosinophils Absolute: 0.4 10*3/uL (ref 0.0–0.5)
Eosinophils Relative: 3 %
HCT: 22.2 % — ABNORMAL LOW (ref 36.0–46.0)
Hemoglobin: 7.3 g/dL — ABNORMAL LOW (ref 12.0–15.0)
Immature Granulocytes: 1 %
Lymphocytes Relative: 25 %
Lymphs Abs: 3 10*3/uL (ref 0.7–4.0)
MCH: 30.8 pg (ref 26.0–34.0)
MCHC: 32.9 g/dL (ref 30.0–36.0)
MCV: 93.7 fL (ref 80.0–100.0)
Monocytes Absolute: 1.7 10*3/uL — ABNORMAL HIGH (ref 0.1–1.0)
Monocytes Relative: 15 %
Neutro Abs: 6.4 10*3/uL (ref 1.7–7.7)
Neutrophils Relative %: 54 %
Platelets: 354 10*3/uL (ref 150–400)
RBC: 2.37 MIL/uL — ABNORMAL LOW (ref 3.87–5.11)
RDW: 18.9 % — ABNORMAL HIGH (ref 11.5–15.5)
WBC: 11.8 10*3/uL — ABNORMAL HIGH (ref 4.0–10.5)
nRBC: 0.3 % — ABNORMAL HIGH (ref 0.0–0.2)

## 2022-07-17 LAB — BASIC METABOLIC PANEL
Anion gap: 11 (ref 5–15)
BUN: 47 mg/dL — ABNORMAL HIGH (ref 8–23)
CO2: 22 mmol/L (ref 22–32)
Calcium: 9.4 mg/dL (ref 8.9–10.3)
Chloride: 103 mmol/L (ref 98–111)
Creatinine, Ser: 3.61 mg/dL — ABNORMAL HIGH (ref 0.44–1.00)
GFR, Estimated: 12 mL/min — ABNORMAL LOW (ref >60)
Glucose, Bld: 114 mg/dL — ABNORMAL HIGH (ref 70–99)
Potassium: 3.8 mmol/L (ref 3.5–5.1)
Sodium: 136 mmol/L (ref 135–145)

## 2022-07-17 MED ORDER — FUROSEMIDE 10 MG/ML IJ SOLN
80.0000 mg | Freq: Once | INTRAMUSCULAR | Status: AC
  Administered 2022-07-17: 80 mg via INTRAVENOUS
  Filled 2022-07-17: qty 8

## 2022-07-17 NOTE — Progress Notes (Signed)
PROGRESS NOTE    [name]  [id number] DOB: Nov 29, 1940 DOA: 07/02/2022 PCP: Cassandria Anger, MD   Brief Narrative:  [name] is a 82 y.o. female with a history of CKD stage IV, hypertension, rheumatoid arthritis, hypothyroidism, CAD s/p CABG, COPD, hyperlipidemia, DVT/PE. Patient presented secondary to weakness and fever and is found to have evidence of sepsis with a likely urinary source. Empiric antibiotics started and cultures obtained. Patient also found to have hyponatremia with associated dehydration and AKI. IV fluids started. Blood cultures significant for E. Coli bacteremia. Renal function improving with IV fluids. Workup significant for acute polynephritis/bacteremia, as well she did develop melena secondary to gastric/duodenal ulcers.   Assessment & Plan:   Principal Problem:   Acute pyelonephritis Active Problems:   Acute renal failure superimposed on stage 4 chronic kidney disease (HCC)   Acute metabolic encephalopathy   Hyponatremia   Sepsis with acute renal failure (HCC)   Acquired hypothyroidism   Essential hypertension   Rheumatoid arthritis (HCC)   Anticoagulated   Hx of CABG   Hyperlipidemia   COPD (chronic obstructive pulmonary disease) (HCC)   CKD (chronic kidney disease) stage 4, GFR 15-29 ml/min (HCC) - baseline SCr 2.5. follows with Dr. Royce Macadamia with [facility]   History of pulmonary embolism   Upper GI bleed   Melena   Metabolic acidosis, increased anion gap   Generalized weakness   DNR (do not resuscitate)/DNI(Do Not Intubate)   E coli bacteremia   Dark stools   Anemia   Acute gastric ulcer with hemorrhage   Duodenal ulcer   Sepsis due to acute hemorrhagic cystitis /right pyelonephritis with E. coli bacteremia Sepsis present on admission - Patient with non-specific symptoms but with associated fever and leukocytosis.  She was noted to have significant right flank pain/CVA tenderness on exam . Urinalysis suggests infection,  confirmed on urine culture. Patient started empirically on Ceftriaxone IV. Associated E. Coli bacteremia -on IV  Ceftriaxone 2 g IV, narrowed to IV cefazolin, then on Unasyn.  She completed total of 10 days of antibiotics   Acute blood loss anemia Acute upper GI bleed due to gastric ulcer/duodenal ulcers -Developed melena 3/8, Eliquis was held, GI input greatly appreciated, underwent EGD 07/08/2022, significant for gastric and duodenal ulcer, no evidence of active bleed, recommendation to hold anticoagulation for few days and for Protonix twice daily for 6 weeks and then once daily.  Continue to monitor CBC closely.  Hemoglobin has dropped a little and currently 7.3 Eliquis resumed on 07/14/2022.  Will check tomorrow morning and transfuse if less than 7.   Hyponatremia Sodium of 124 on admission. Likely related to dehydration and hypovolemia, present on admission.  -Resolved with IV hydration.   Acute metabolic encephalopathy  Likely related to overall illness but more specifically dehydration and hyponatremia. Patient is from independent living.  She is now alert and oriented.   Acute renal failure superimposed on stage 4 chronic kidney disease (HCC) Metabolic acidosis, increased anion gap - baseline SCr 2.5. follows with Dr. Royce Macadamia with [facility] Creatinine of 4.70 on admission secondary to poor oral intake in addition to patient continuing diuretic use.  Creatinine improved to 2.8 but then has been creeping up for last few days and improved somewhat today and currently 3.57.  Bicarb 23 today..  Continue bicarb tablets.  Renal ultrasound negative for obstruction.  Creatinine improved and plateaued around 3.3 range for few days and then started creeping up and currently 3.61.  Consulted nephrology.  They have ordered 80 mg IV Lasix x 1.  Will defer to nephrology now.  Essential hypertension Patient blood pressure is now fairly controlled on increased dose of Coreg and home dose of amlodipine.   She is also on Lasix now.   Hypokalemia Resolved.   History of pulmonary embolism Eliquis resumed on 07/14/2022.   COPD (chronic obstructive pulmonary disease) (HCC) Stable. Asymptomatic.   Hyperlipidemia -Continue Lipitor daily   Hx of CABG Noted. No chest pain.   Long term (current) use of anticoagulants Noted. Patient is on Eliquis as an outpatient for the indication of PE/DVT which does not qualify for dose reduction, however patient is on reduced dose of 2.5 mg BID as an outpatient; possibly related to history of GI bleeding.   Rheumatoid arthritis ([place]) Patient is on [place] as an outpatient which is held secondary to active infection.   Acquired hypothyroidism -Continue Synthroid 88 mcg daily   Generalized weakness Secondary to acute illness. PT/OT recommending SNF.  Plan to discharge to SNF once insurance authorization approved.   Acute hypoxic respiratory failure Pneumonia Patient at times is requiring oxygen, especially when she is laying flat.  She is requiring 1 to 2 L of oxygen.  However she denies any shortness of breath when sitting up.  She has diminished breath sounds at bases, she has been advised multiple times and encouraged to use incentive spirometry but she is not following recommendations, she tends to forget.  Repeat chest x-ray on 07/15/2022 shows atelectasis as well and procalcitonin unremarkable indicates likely atelectasis clinically as well.  Disposition: Patient fluid overloaded, requires more IV diuresis.  Not medically stable for discharge yet.  TOC aware, will consult peer to peer.  Will do peer to peer if required once medically stable.  DVT prophylaxis: Place TED hose Start: 07/15/22 0954 apixaban (ELIQUIS) tablet 2.5 mg Start: 07/14/22 1015 SCDs Start: 07/03/22 0431   Code Status: DNR  Family Communication: Daughter present at bedside.  Plan of care discussed with patient in length and he/she verbalized understanding and agreed with  it.  Status is: Inpatient Remains inpatient appropriate because: Needs more IV diuresis.   Estimated body mass index is 29.48 kg/m as calculated from the following:   Height as of this encounter: 5\' 3"  (1.6 m).   Weight as of this encounter: 75.5 kg.    Nutritional Assessment: Body mass index is 29.48 kg/m.[name] Seen by dietician.  I agree with the assessment and plan as outlined below: Nutrition Status:        . Skin Assessment: I have examined the patient's skin and I agree with the wound assessment as performed by the wound care RN as outlined below:    Consultants:  GI  Procedures:  CT  Antimicrobials:  Anti-infectives (From admission, onward)    Start     Dose/Rate Route Frequency Ordered Stop   07/08/22 1245  ampicillin-sulbactam (UNASYN) 1.5 g in sodium chloride 0.9 % 100 mL IVPB        1.5 g 200 mL/hr over 30 Minutes Intravenous Every 12 hours 07/08/22 1154 07/13/22 1030   07/05/22 2000  ceFAZolin (ANCEF) IVPB 2g/100 mL premix  Status:  Discontinued        2 g 200 mL/hr over 30 Minutes Intravenous Every 12 hours 07/05/22 0958 07/05/22 1004   07/05/22 1530  ceFAZolin (ANCEF) IVPB 2g/100 mL premix  Status:  Discontinued        2 g 200 mL/hr over 30 Minutes Intravenous Every 12 hours 07/05/22  1004 07/08/22 1145   07/04/22 0000  cefTRIAXone (ROCEPHIN) 1 g in sodium chloride 0.9 % 100 mL IVPB  Status:  Discontinued        1 g 200 mL/hr over 30 Minutes Intravenous Every 24 hours 07/03/22 0211 07/03/22 1153   07/03/22 1600  cefTRIAXone (ROCEPHIN) 2 g in sodium chloride 0.9 % 100 mL IVPB  Status:  Discontinued        2 g 200 mL/hr over 30 Minutes Intravenous Every 24 hours 07/03/22 1153 07/05/22 0958   07/03/22 0215  cefTRIAXone (ROCEPHIN) 1 g in sodium chloride 0.9 % 100 mL IVPB        1 g 200 mL/hr over 30 Minutes Intravenous  Once 07/03/22 0212 07/03/22 0243   07/03/22 0145  vancomycin (VANCOREADY) IVPB 1250 mg/250 mL  Status:  Discontinued        1,250 mg 166.7  mL/hr over 90 Minutes Intravenous  Once 07/03/22 0132 07/03/22 0210   07/03/22 0130  ceFEPIme (MAXIPIME) 2 g in sodium chloride 0.9 % 100 mL IVPB  Status:  Discontinued        2 g 200 mL/hr over 30 Minutes Intravenous  Once 07/03/22 0124 07/03/22 0210   07/03/22 0130  metroNIDAZOLE (FLAGYL) IVPB 500 mg  Status:  Discontinued        500 mg 100 mL/hr over 60 Minutes Intravenous  Once 07/03/22 0124 07/03/22 0210   07/03/22 0130  vancomycin (VANCOCIN) IVPB 1000 mg/200 mL premix  Status:  Discontinued        1,000 mg 200 mL/hr over 60 Minutes Intravenous  Once 07/03/22 0124 07/03/22 0130         Subjective:  Patient seen and examined.  Daughter at the bedside.  She has no complaints.  She is more alert and oriented.  Objective: Vitals:   07/16/22 2337 07/17/22 0400 07/17/22 0800 07/17/22 1137  BP: (!) 154/60 (!) 146/63 (!) 167/70 (!) 128/52  Pulse: 89 84 89 83  Resp: 18 17 18 15   Temp: 98.1 F (36.7 C) 97.9 F (36.6 C) 98.2 F (36.8 C) 97.9 F (36.6 C)  TempSrc: Oral Oral Oral Oral  SpO2: 90% 94% 92%   Weight:      Height:       No intake or output data in the 24 hours ending 07/17/22 1154  Filed Weights   07/14/22 0404 07/15/22 0500 07/16/22 0500  Weight: 75.9 kg 75.9 kg 75.5 kg    Examination:  General exam: Appears calm and comfortable  Respiratory system: Clear to auscultation. Respiratory effort normal. Cardiovascular system: S1 & S2 heard, RRR. No JVD, murmurs, rubs, gallops or clicks.  +3 pitting edema bilateral lower extremity and +1 pitting edema bilateral upper extremity Gastrointestinal system: Abdomen is nondistended, soft and nontender. No organomegaly or masses felt. Normal bowel sounds heard. Central nervous system: Alert and oriented. No focal neurological deficits. Extremities: Symmetric 5 x 5 power. Skin: No rashes, lesions or ulcers.  Psychiatry: Judgement and insight appear normal. Mood & affect appropriate.   Data Reviewed: I have personally  reviewed following labs and imaging studies  CBC: Recent Labs  Lab 07/11/22 0330 07/12/22 0311 07/14/22 0747 07/17/22 0300  WBC 18.2* 15.6* 14.7* 11.8*  NEUTROABS  --   --  10.4* 6.4  HGB 8.6* 7.8* 8.1* 7.3*  HCT 25.4* 23.0* 24.9* 22.2*  MCV 92.4 92.0 95.4 93.7  PLT 342 310 375 [id number]    Basic Metabolic Panel: Recent Labs  Lab 07/13/22 0225 07/14/22 0747 07/15/22  [id number] 07/16/22 0759 07/17/22 0300  NA 138 139 139 142 136  K 3.4* 3.6 3.3* 3.9 3.8  CL 109 106 104 106 103  CO2 18* 19* 23 22 22   GLUCOSE 99 124* 106* 108* 114*  BUN 45* 43* 45* 44* 47*  CREATININE 3.38* 3.29* 3.32* 3.40* 3.61*  CALCIUM 9.0 9.4 9.0 9.7 9.4    GFR: Estimated Creatinine Clearance: 11.3 mL/min (A) (by C-G formula based on SCr of 3.61 mg/dL (H)). Liver Function Tests: Recent Labs  Lab 07/14/22 0747  AST 13*  ALT 5  ALKPHOS 88  BILITOT 0.9  PROT 6.3*  ALBUMIN 3.4*    No results for input(s): "LIPASE", "AMYLASE" in the last 168 hours. No results for input(s): "AMMONIA" in the last 168 hours. Coagulation Profile: No results for input(s): "INR", "PROTIME" in the last 168 hours. Cardiac Enzymes: No results for input(s): "CKTOTAL", "CKMB", "CKMBINDEX", "TROPONINI" in the last 168 hours. BNP (last 3 results) No results for input(s): "PROBNP" in the last 8760 hours. HbA1C: No results for input(s): "HGBA1C" in the last 72 hours. CBG: No results for input(s): "GLUCAP" in the last 168 hours. Lipid Profile: No results for input(s): "CHOL", "HDL", "LDLCALC", "TRIG", "CHOLHDL", "LDLDIRECT" in the last 72 hours. Thyroid Function Tests: No results for input(s): "TSH", "T4TOTAL", "FREET4", "T3FREE", "THYROIDAB" in the last 72 hours. Anemia Panel: No results for input(s): "VITAMINB12", "FOLATE", "FERRITIN", "TIBC", "IRON", "RETICCTPCT" in the last 72 hours. Sepsis Labs: Recent Labs  Lab 07/12/22 0826 07/16/22 0759  PROCALCITON 0.70 0.18     No results found for this or any previous visit  (from the past 240 hour(s)).    Radiology Studies: No results found.  Scheduled Meds:  acidophilus  1 capsule Oral TID   amLODipine  10 mg Oral Daily   apixaban  2.5 mg Oral BID   atorvastatin  80 mg Oral Daily   carvedilol  6.25 mg Oral BID WC   levothyroxine  88 mcg Oral QAC breakfast   pantoprazole  40 mg Oral BID   QUEtiapine  25 mg Oral Q24H   triamcinolone  1 spray Each Nare Daily   Continuous Infusions:     LOS: 14 days   Darliss Cheney, MD [facility]  07/17/2022, 11:54 AM   *Please note that this is a verbal dictation therefore any spelling or grammatical errors are due to the "[place] [employer]" system interpretation.  Please page via [place] and do not message via secure chat for urgent patient care matters. Secure chat can be used for non urgent patient care matters.  How to contact the [facility] Attending or Consulting provider [place] or covering provider during after hours [place], for this patient?  Check the care team in [facility] and look for a) attending/consulting [facility] provider listed and b) the [facility] team listed. Page or secure chat [phone]. Log into [email or web address] and use [place]'s universal password to access. If you do not have the password, please contact the hospital operator. Locate the [facility] provider you are looking for under [facility] and page to a number that you can be directly reached. If you still have difficulty reaching the provider, please page the [facility] (Director on Call) for the Hospitalists listed on amion for assistance.

## 2022-07-17 NOTE — Consult Note (Signed)
[name] Admit Date: 07/02/2022 07/17/2022 [name] Requesting Physician:  Dr. Doristine Bosworth  Reason for Consult:  AKI  HPI:  Patient with history of CKD IV, follows with Dr. Royce Macadamia, HTN, rheumatoid arthritis, hypothyroidism, CAD s/p CABG, COPD, HLD, DVT/PE who presented with weakness and fever and was found to have sepsis of presumed urinary source with pyelonephritis. Hospital course complicated by acute blood loss anemia 2/2 gastric/duodenal ulcers.  Nephrology consulted due to increase in Cr over the past several days. Was as high at 4.7 on admission, then improved to 2.8 and has since worsened to 3.61 this morning. Patient has significant bilateral upper and lower extremity edema that is new from her baseline, was getting Lasix but this is being held since 3/16 in the setting of rising Creatinine. [name]   PMH Incudes: CKD IV (Baseline Cr 2.2); Hypertension, hypothyroidism, CAD s/p CABG, COPD, HLD, DVT/PE on Eliquis.    Creat (mg/dL)  Date Value  12/26/2019 2.19 (H)  08/08/2019 1.82 (H)  04/06/2014 1.51 (H)  01/18/2012 1.40 (H)   Creatinine, Ser (mg/dL)  Date Value  07/17/2022 3.61 (H)  07/16/2022 3.40 (H)  07/15/2022 3.32 (H)  07/14/2022 3.29 (H)  07/13/2022 3.38 (H)  07/12/2022 3.57 (H)  07/11/2022 3.70 (H)  07/10/2022 3.48 (H)  07/09/2022 3.27 (H)  07/08/2022 3.08 (H)  ] I/Os:  ROS NSAIDS: None IV Contrast None recently TMP/SMX No exposure Hypotension none, MAPs consistently >80-90 since admission.   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 Most likely scar. Mild subendocardial ischemia. EF 71% LOW RISK.    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 Charlestine Night   Pulmonary emboli [facility]) 02/06/2022   01/2022 PE/DVT post-op. Lower GI bleed     Treat edema  Start Eliquis at the reduced dose due to Creat>1.5 and age >65   Rheumatoid arthritis ([place]) 2011   Dr.Truslow; fracture knees, hands and wrists -    S/P CABG x 1 1996   CABG--LIMA-LAD for ostial LAD (not felt to be PCI amenable). EF NORMAL then; LIMA now atretic   Shortness of breath dyspnea    with exertion   Stroke ([place])  11/2014   TIA    Urinary frequency    PSH  Past Surgical History:  Procedure Laterality Date   ABDOMINAL AORTIC ANEURYSM REPAIR  [id number]   Complicated by mesenteric artery stenosis and splenic artery infarction with acquired Asplenia   APPENDECTOMY     BIOPSY  07/08/2022   Procedure: BIOPSY;  Surgeon: Yetta Flock, MD;  Location: [facility] ENDOSCOPY;  Service: Gastroenterology;;   Lillard Anes  07/2011   right foot   CARDIAC CATHETERIZATION  2005   (Most recent CATH) - ostial LAD lesion 20-30% (down from 90% initially). Atretic LIMA. Minimal disease the RCA and Circumflex system.   CARPAL TUNNEL RELEASE Left    CATARACT EXTRACTION Bilateral    CERVICAL  SPINE SURGERY     plate 2008 Dr. Saintclair Halsted   COLONOSCOPY     CORONARY ARTERY BYPASS GRAFT  1996   INCLUDED AN INTERNAL MAMMARY ARTERY TO THE LAD. EF WAS NORMAL   ESOPHAGOGASTRODUODENOSCOPY (EGD) WITH PROPOFOL N/A 07/08/2022   Procedure: ESOPHAGOGASTRODUODENOSCOPY (EGD) WITH PROPOFOL;  Surgeon: Yetta Flock, MD;  Location: [place];  Service: Gastroenterology;  Laterality: N/A;   INGUINAL HERNIA REPAIR Right    LAPAROSCOPIC APPENDECTOMY N/A 06/28/2016   Procedure: APPENDECTOMY LAPAROSCOPIC;  Surgeon: Leighton Ruff, MD;  Location: [facility] ORS;  Service: General;  Laterality: N/A;   NM MYOVIEW LTD  06/2010   Fixed anteroseptal, apical and inferoapical defect with moderate size. Most likely scar. Mild subendocardial ischemia. EF 71% LOW RISK.    SPLENECTOMY     TOTAL HIP ARTHROPLASTY Left 01/23/2022   Procedure: LEFT TOTAL HIP ARTHROPLASTY ANTERIOR APPROACH;  Surgeon: Leandrew Koyanagi, MD;  Location: [place];  Service: Orthopedics;  Laterality: Left;  3-C   TRANSTHORACIC ECHOCARDIOGRAM  12/2014   [facility]: Normal LV size & function. EF 55-60%,    vagina polyp     VIDEO ASSISTED THORACOSCOPY (VATS)/WEDGE RESECTION Left 03/02/2015   Procedure: VIDEO ASSISTED THORACOSCOPY (VATS), MINI THORACOTOMY, LEFT UPPER LOBE WEDGE, TAKE DOWN OF INTERNAL MAMMARY LESIONS, PLACEMENT OF ON-Q PUMP;  Surgeon: Grace Isaac, MD;  Location: [place];  Service: Thoracic;  Laterality: Left;   VIDEO BRONCHOSCOPY N/A 03/02/2015   Procedure: BRONCHOSCOPY;  Surgeon: Grace Isaac, MD;  Location: [place];  Service: Thoracic;  Laterality: N/A;   VIDEO BRONCHOSCOPY WITH ENDOBRONCHIAL NAVIGATION N/A 10/08/2017   Procedure: VIDEO BRONCHOSCOPY WITH ENDOBRONCHIAL NAVIGATION WITH BIOPSIES OF LEFT UPPER LOBE AND LEFT LOWER LOBE;  Surgeon: Grace Isaac, MD;  Location: [place];  Service: Thoracic;  Laterality: N/A;   FH  Family History  Problem Relation Age of Onset   Hypertension Mother    Diabetes Mother    Heart  disease Mother    Hyperlipidemia Mother    Stroke Father    Hyperlipidemia Sister    Hypertension Sister    Hypertension Daughter    Cancer Paternal Uncle        Deceased from cancer not sure of site   Hypertension Son    Hyperlipidemia Son    Hyperlipidemia Son    Hypertension Son    Hyperlipidemia Son    Hypertension Son    Hypertension Other    Coronary artery disease Other    Asthma Neg Hx    Colon cancer Neg Hx    SH  reports that she quit smoking about 63 years ago. Her smoking use included cigarettes. She has never been exposed to tobacco smoke. She has never used smokeless tobacco. She reports current alcohol use.  She reports that she does not use drugs. Allergies  Allergies  Allergen Reactions   Nsaids Other (See Comments)    Stomach upset Told to avoid due to Eliquis   Aspirin Other (See Comments)    Stomach upset Told to avoid due to Eliquis   Codeine Nausea And Vomiting   Nitrostat [Nitroglycerin] Other (See Comments)    Bradycardia. Drop in heart rate    Home medications Prior to Admission medications   Medication Sig Start Date End Date Taking? Authorizing Provider  acetaminophen (TYLENOL) 325 MG tablet Take 2 tablets (650 mg total) by mouth 2 (two) times daily as needed (generalized pain). 03/27/22  Yes Mast, Man X, NP  albuterol (VENTOLIN HFA) 108 (90 Base) MCG/ACT inhaler Inhale 1-2 puffs into the lungs every 4 (four) hours as needed for wheezing or shortness of breath. 03/27/22  Yes Mast, Man X, NP  apixaban (ELIQUIS) 2.5 MG TABS tablet Take 1 tablet (2.5 mg total) by mouth 2 (two) times daily. 04/28/22  Yes Plotnikov, [name], MD  ascorbic acid (VITAMIN C) 500 MG tablet Take 1 tablet (500 mg total) by mouth daily. 03/27/22  Yes Mast, Man X, NP  atorvastatin (LIPITOR) 80 MG tablet Take 1 tablet (80 mg total) by mouth daily. 03/27/22  Yes Mast, Man X, NP  carvedilol (COREG) 3.125 MG tablet Take 1 tablet (3.125 mg total) by mouth 2 (two) times daily with a  meal. 05/23/22  Yes Plotnikov, [name], MD  Cyanocobalamin 2500 MCG TABS Take 2,500 mcg by mouth daily. 03/27/22  Yes Mast, Man X, NP  docusate sodium (COLACE) 100 MG capsule Take 1-2 capsules (100-200 mg total) by mouth daily as needed for mild constipation. 03/27/22  Yes Mast, Man X, NP  Golimumab 50 MG/0.5ML SOAJ Take 50 mg by mouth every 2 (two) months. Hold for the next 2 weeks per Orthopedic surgery recommendations. 03/27/22  Yes Mast, Man X, NP  leflunomide (ARAVA) 20 MG tablet Take 1 tablet (20 mg total) by mouth daily. 03/27/22  Yes Mast, Man X, NP  levothyroxine (SYNTHROID) 88 MCG tablet Take 1 tablet (88 mcg total) by mouth daily before breakfast. 03/27/22  Yes Mast, Man X, NP  Multiple Vitamins-Minerals (CENTRUM ADULT PO) Take 1 tablet by mouth daily. 1 Tablet Daily.   Yes [provider]  polyethylene glycol (MIRALAX / GLYCOLAX) 17 g packet Take 17 g by mouth daily as needed. Patient taking differently: Take 17 g by mouth daily as needed for mild constipation. 03/27/22  Yes Mast, Man X, NP  triamcinolone (NASACORT) 55 MCG/ACT AERO nasal inhaler Place 1 spray into the nose daily. 03/27/22  Yes Mast, Man X, NP  Cholecalciferol (VITAMIN D3) 50 MCG (2000 UT) capsule Take 1 capsule (2,000 Units total) by mouth daily. 03/27/22   Mast, Man X, NP  gabapentin (NEURONTIN) 300 MG capsule Take 1 capsule (300 mg total) by mouth at bedtime. 03/27/22   Mast, Man X, NP    Current Medications Scheduled Meds:  acidophilus  1 capsule Oral TID   amLODipine  10 mg Oral Daily   apixaban  2.5 mg Oral BID   atorvastatin  80 mg Oral Daily   carvedilol  6.25 mg Oral BID WC   levothyroxine  88 mcg Oral QAC breakfast   pantoprazole  40 mg Oral BID   QUEtiapine  25 mg Oral Q24H   sodium bicarbonate  650 mg Oral BID   triamcinolone  1 spray Each Nare Daily   Continuous Infusions: PRN  Meds:.acetaminophen **OR** acetaminophen, hydrALAZINE, hydrALAZINE, ondansetron **OR** ondansetron (ZOFRAN) IV,  polyethylene glycol, witch hazel-glycerin  CBC Recent Labs  Lab 07/12/22 0311 07/14/22 0747 07/17/22 0300  WBC 15.6* 14.7* 11.8*  NEUTROABS  --  10.4* 6.4  HGB 7.8* 8.1* 7.3*  HCT 23.0* 24.9* 22.2*  MCV 92.0 95.4 93.7  PLT 310 375 [id number]   Basic Metabolic Panel Recent Labs  Lab 07/11/22 0330 07/12/22 0311 07/13/22 0225 07/14/22 0747 07/15/22 0337 07/16/22 0759 07/17/22 0300  NA 137 139 138 139 139 142 136  K 4.2 3.9 3.4* 3.6 3.3* 3.9 3.8  CL 108 110 109 106 104 106 103  CO2 17* 18* 18* 19* 23 22 22   GLUCOSE 94 111* 99 124* 106* 108* 114*  BUN 55* 50* 45* 43* 45* 44* 47*  CREATININE 3.70* 3.57* 3.38* 3.29* 3.32* 3.40* 3.61*  CALCIUM 9.0 8.8* 9.0 9.4 9.0 9.7 9.4    Physical Exam  Blood pressure (!) 146/63, pulse 84, temperature 97.9 F (36.6 C), temperature source Oral, resp. rate 17, height 5\' 3"  (1.6 m), weight 75.5 kg, SpO2 94 %. GEN: Elderly female, NAD and in good spirits  ENT: Mucous membranes moist EYES: EOMs intact, sclerae anicteric CV: RRR, no m/r/g. 2+ pitting edema to bilateral upper and lower extremities  PULM: Normal WOB on RA during interview, speaking in full sentences, lungs clear ABD: Mildly distended without tenderness or fluid wave SKIN: Without rash or excoriation EXT:1-2+ BUE edema, 2+ BLE edema, compressive stockings in place  Neuro: Alert and oriented, speech is fluent  Assessment Acute Kidney Injury on CKD IV In the setting of pyelonephritis. Baseline Cr 2.2-2.3. Cr as high as 4.70 on admission, downtrended to 2.8 and has been steady around 3.3-3.6 for the past few days. Last Lasix dose was in the morning of 3/16. Renal US on 3/12 without evidence of hydronephrosis, though with increased cortical echogeniticty and parenchymal scarring consistent with CKD. Unfortunately UOP not documented. She remains quite volume overloaded on exam, suspect needs further diuresis/decongestion. No indication for HD at this time.   Acute Blood Loss Anemia 2.   In  the setting of gastric and duodenal ulcers on Eliquis. Eliquis resumed on 3/15. Hgb 8.1>7.3 this am. No frank bleeding per patient.   Hypertension 3.   Fairly well-controlled on amlodipine and Coreg per hospitalists.   4. History of PE  5. COPD  6. History of CABG  7. Rheumatoid arthritis  Plan Acute Kidney Injury on CKD IV -Need to monitor strict I/O -Will give IV Lasix 80mg  x1 -Avoid nephrotoxic meds.  -Stop sodium bicarb -Trend Cr on am BMP  2.   Acute Blood Loss Anemia -Per primary, GI  3.   Hypertension - Per primary  Remainder per primary team.   [name]  PGY-2 [place] [facility]  07/17/2022, 8:52 AM

## 2022-07-17 NOTE — TOC Progression Note (Addendum)
Transition of Care [facility]) - Progression Note    Patient Details  Name: [name] MRN: [id number] Date of Birth: 02-13-1941  Transition of Care [facility]) CM/SW [place], [place] Phone Number: 07/17/2022, 9:10 AM  Clinical Narrative:    9am-Insurance is offering a peer to peer on SNF request for patient: p. [phone] option #5, deadline 12pm today. MD aware.   11am-Per MD, patient now not medically stable, CSW contacted [employer] to cancel the authorization request and will restart it when stable.    Expected Discharge Plan: [place] Barriers to Discharge: Continued Medical Work up  Expected Discharge Plan and Services In-house Referral: Clinical Social Work     Living arrangements for the past 2 months: [place]                                       Social Determinants of Health (SDOH) Interventions SDOH Screenings   Food Insecurity: No Food Insecurity (06/13/2022)  Housing: Low Risk  (06/13/2022)  Transportation Needs: No Transportation Needs (06/13/2022)  Utilities: Not At Risk (06/13/2022)  Alcohol Screen: Low Risk  (05/30/2021)  Depression (PHQ2-9): Low Risk  (06/15/2022)  Financial Resource Strain: Low Risk  (06/13/2022)  Physical Activity: Inactive (06/13/2022)  Social Connections: Unknown (06/13/2022)  Stress: No Stress Concern Present (06/13/2022)  Tobacco Use: Medium Risk (07/11/2022)    Readmission Risk Interventions    07/05/2022    9:42 AM  Readmission Risk Prevention Plan  Transportation Screening Complete  Medication Review (RN Care Manager) Complete  PCP or Specialist appointment within 3-5 days of discharge Complete  HRI or [place] Complete  SW Recovery Care/Counseling Consult Complete  Palliative Care Screening Not Applicable  Skilled Nursing Facility Complete

## 2022-07-17 NOTE — Progress Notes (Signed)
Mobility Specialist: Progress Note   07/17/22 1722  Mobility  Activity Ambulated with assistance in hallway  Level of Assistance Minimal assist, patient does 75% or more  Assistive Device Front wheel walker  Distance Ambulated (ft) 400 ft (200'x2)  Activity Response Tolerated well  Mobility Referral Yes  $Mobility charge 1 Mobility   Pre-Mobility: 84 HR, 94% SpO2 During Mobility: 94% SpO2 Post-Mobility: 81 HR, 91% SpO2  Pt received in the bed and agreeable to mobility. MinA with bed mobility and mod I to stand. To [facility] per request then hallway ambulation. Stopped x1 for seated break secondary to SOB, otherwise asymptomatic. Pt back to bed after session with call bell in reach and family present in the room.   [place] Rilea Arutyunyan Mobility Specialist Please contact via SecureChat or Rehab office at [phone]

## 2022-07-17 NOTE — Progress Notes (Signed)
Occupational Therapy Treatment Patient Details Name: NURI TICKNOR MRN: [id number] DOB: Mar 07, 1941 Today's Date: 07/17/2022   History of present illness 82 y.o. female presents [facility] hospital on 07/02/2022 with worsening weakness. Pt admitted for management of acute cystitis. PMH includes CKD IV, HTN, RA, hypothyroidism, CABG, COPD, HLD, PE.   OT comments  Patient progressing with OT treatment. Patient able to get to EOB and transfer to [facility] with min guard. Patient stood for toilet hygiene but required max assist to complete. Grooming and bathing tasks performed seated at sink due to fatigue and patient able to assist with pulling up brief while standing. Patient required cues for breathing technique during visit with SpO2 88-90 on RA during self care tasks. Acute OT to continue to follow to address vision and self care.    Recommendations for follow up therapy are one component of a multi-disciplinary discharge planning process, led by the attending physician.  Recommendations may be updated based on patient status, additional functional criteria and insurance authorization.    Follow Up Recommendations  Home health OT (vs SNF depending on family ability to provided support)     Assistance Recommended at Discharge Intermittent Supervision/Assistance  Patient can return home with the following  A little help with walking and/or transfers;A little help with bathing/dressing/bathroom;Assistance with cooking/housework;Assist for transportation;Help with stairs or ramp for entrance   Equipment Recommendations  None recommended by OT    Recommendations for Other Services      Precautions / Restrictions Precautions Precautions: Fall;Other (comment) Precaution Comments: watch 02/RR Restrictions Weight Bearing Restrictions: No       Mobility Bed Mobility Overal bed mobility: Needs Assistance Bed Mobility: Supine to Sit     Supine to sit: Min guard     General bed mobility comments:  increased time and verbal cues with rail use    Transfers Overall transfer level: Needs assistance Equipment used: Rolling walker (2 wheels) Transfers: Sit to/from Stand, Bed to chair/wheelchair/BSC Sit to Stand: Supervision           General transfer comment: min guard for transfer to [facility] from EOB due to lethargic and HHA. Ambulated to recliner from sink with RW and supervision for safety     Balance Overall balance assessment: Needs assistance Sitting-balance support: Feet supported, No upper extremity supported Sitting balance-Leahy Scale: Good     Standing balance support: During functional activity Standing balance-Leahy Scale: Fair Standing balance comment: able to assist with pulling up brief while standing                           ADL either performed or assessed with clinical judgement   ADL Overall ADL's : Needs assistance/impaired     Grooming: Wash/dry face;Oral care;Wash/dry hands;Set up;Sitting Grooming Details (indicate cue type and reason): sitting due to fatigue from standing for toilet hygiene Upper Body Bathing: Set up;Sitting Upper Body Bathing Details (indicate cue type and reason): at sink     Upper Body Dressing : Minimal assistance;Sitting Upper Body Dressing Details (indicate cue type and reason): change gown due to lines Lower Body Dressing: Sitting/lateral leans;Moderate assistance Lower Body Dressing Details (indicate cue type and reason): mod assist for complresssion stockings and socks, min assist for pull up brief Toilet Transfer: Min Patent examiner Details (indicate cue type and reason): transferred to [facility] from EOB with HHA Toileting- Clothing Manipulation and Hygiene: Maximal assistance;Sit to/from stand Toileting - Clothing Manipulation Details (indicate cue type and reason):  required assistance for toilet hygiene while standing due to patient stating difficulty to reach even with encouragement        General ADL Comments: bathing and grooming performed seated due to fatigue    Extremity/Trunk Assessment              Vision       Perception     Praxis      Cognition Arousal/Alertness: Awake/alert Behavior During Therapy: WFL for tasks assessed/performed Overall Cognitive Status: Within Functional Limits for tasks assessed                                 General Comments: lethargic initially but became more alert once OOB        Exercises      Shoulder Instructions       General Comments O2 removed during self care and patient was 88-90 on RA    Pertinent Vitals/ Pain       Pain Assessment Pain Assessment: No/denies pain  Home Living                                          Prior Functioning/Environment              Frequency  Min 2X/week        Progress Toward Goals  OT Goals(current goals can now be found in the care plan section)  Progress towards OT goals: Progressing toward goals  Acute Rehab OT Goals Patient Stated Goal: go home OT Goal Formulation: With patient Time For Goal Achievement: 07/18/22 Potential to Achieve Goals: Good ADL Goals Pt Will Perform Grooming: with modified independence;standing Pt Will Perform Upper Body Dressing: with modified independence;sitting Pt Will Perform Lower Body Dressing: with modified independence;sit to/from stand Pt Will Transfer to Toilet: with modified independence;ambulating;regular height toilet Additional ADL Goal #1: Pt will verbalize use of visual occlusion glasses with taping over R eye when experiencing double vision. Additional ADL Goal #2: Pt will perform VOR exercises to optimize visual tracking horizintally without jumping movements in her eyes.  Plan Discharge plan remains appropriate    Co-evaluation                 AM-PAC OT "6 Clicks" Daily Activity     Outcome Measure   Help from another person eating meals?: None Help from  another person taking care of personal grooming?: A Little Help from another person toileting, which includes using toliet, bedpan, or urinal?: A Little Help from another person bathing (including washing, rinsing, drying)?: A Little Help from another person to put on and taking off regular upper body clothing?: A Little Help from another person to put on and taking off regular lower body clothing?: A Little 6 Click Score: 19    End of Session Equipment Utilized During Treatment: Gait belt;Rolling walker (2 wheels)  OT Visit Diagnosis: Unsteadiness on feet (R26.81);Muscle weakness (generalized) (M62.81);Other abnormalities of gait and mobility (R26.89)   Activity Tolerance Patient tolerated treatment well   Patient Left in chair;with call bell/phone within reach;with chair alarm set;with family/visitor present   Nurse Communication Mobility status        Time: [id number] OT Time Calculation (min): 41 min  Charges: OT General Charges $OT Visit: 1 Visit OT Treatments $Self Care/Home Management : 38-52 mins  Lodema Hong, OTA  Acute Rehabilitation Services  Office [phone]   Trixie Dredge 07/17/2022, 12:19 PM

## 2022-07-17 NOTE — Progress Notes (Signed)
Physical Therapy Treatment Patient Details Name: [name] MRN: [id number] DOB: Feb 27, 1941 Today's Date: 07/17/2022   History of Present Illness 82 y.o. female presents to [facility] hospital on 07/02/2022 with worsening weakness. Pt admitted for management of acute cystitis. PMH includes CKD IV, HTN, RA, hypothyroidism, CABG, COPD, HLD, PE.    PT Comments    Patient progressing well towards PT goals. Session focused on progressive ambulation and functional mobility. Pt reports coughing this morning. Noted to be on room air upon arrival with Sp02 89-90% at rest. Tolerated gait training with 1 seated rest break and 1 standing rest break with 3/4 DOE. Sp02 remained >87% on RA throughout ambulation, able to recover to >90% with pursed lip breathing. Encouraged staying on RA in room at rest and trying pursed lip breathing before donning 02 as pt did not use 02 at home PTA. Daughter present and agreeable. Eager to d/c. Will follow.    Recommendations for follow up therapy are one component of a multi-disciplinary discharge planning process, led by the attending physician.  Recommendations may be updated based on patient status, additional functional criteria and insurance authorization.  Follow Up Recommendations  Home health PT Can patient physically be transported by private vehicle: Yes   Assistance Recommended at Discharge Intermittent Supervision/Assistance  Patient can return home with the following Help with stairs or ramp for entrance;Assist for transportation;Assistance with cooking/housework   Equipment Recommendations  None recommended by PT    Recommendations for Other Services       Precautions / Restrictions Precautions Precautions: Fall;Other (comment) Precaution Comments: watch 02/RR Restrictions Weight Bearing Restrictions: No     Mobility  Bed Mobility               General bed mobility comments: Up in chair upon PT arrival.    Transfers Overall transfer level:  Needs assistance Equipment used: Rolling walker (2 wheels) Transfers: Sit to/from Stand Sit to Stand: Supervision           General transfer comment: SUpervision for safety. Stood from chair x2, cues for hand placement.    Ambulation/Gait Ambulation/Gait assistance: Supervision Gait Distance (Feet): 150 Feet (x2 bouts) Assistive device: Rolling walker (2 wheels) Gait Pattern/deviations: Step-through pattern, Decreased stride length, Trunk flexed       General Gait Details: Slow, mostly steady gait with 1 stnading rest break leaning on forearms and 1 seated rest break. SP02 remained >87% on RA>   Stairs             Wheelchair Mobility    Modified Rankin (Stroke Patients Only)       Balance Overall balance assessment: Needs assistance Sitting-balance support: Feet supported, No upper extremity supported Sitting balance-Leahy Scale: Good     Standing balance support: During functional activity Standing balance-Leahy Scale: Fair Standing balance comment: utilizing RW for ambulation, standing statically without UE support                            Cognition Arousal/Alertness: Awake/alert Behavior During Therapy: WFL for tasks assessed/performed Overall Cognitive Status: Within Functional Limits for tasks assessed                                 General Comments: for basic mobility tasks        Exercises      General Comments General comments (skin integrity, edema, etc.): Sp02 >88% on  RA upon arrival. Daughter present. Sp02 remained >87% on RA throughout ambulation, able to recover to >90% with pursed lip breathing.      Pertinent Vitals/Pain Pain Assessment Pain Assessment: No/denies pain    Home Living                          Prior Function            PT Goals (current goals can now be found in the care plan section) Acute Rehab PT Goals Patient Stated Goal: to go home PT Goal Formulation: With  patient Time For Goal Achievement: 07/31/22 Potential to Achieve Goals: Good Progress towards PT goals: Progressing toward goals    Frequency    Min 3X/week      PT Plan Current plan remains appropriate    Co-evaluation              AM-PAC PT "6 Clicks" Mobility   Outcome Measure  Help needed turning from your back to your side while in a flat bed without using bedrails?: A Little Help needed moving from lying on your back to sitting on the side of a flat bed without using bedrails?: A Little Help needed moving to and from a bed to a chair (including a wheelchair)?: A Little Help needed standing up from a chair using your arms (e.g., wheelchair or bedside chair)?: A Little Help needed to walk in hospital room?: A Little Help needed climbing 3-5 steps with a railing? : A Little 6 Click Score: 18    End of Session Equipment Utilized During Treatment: Gait belt Activity Tolerance: Patient tolerated treatment well Patient left: in chair;with call bell/phone within reach;with chair alarm set;with family/visitor present Nurse Communication: Mobility status PT Visit Diagnosis: Other abnormalities of gait and mobility (R26.89);Muscle weakness (generalized) (M62.81);Repeated falls (R29.6)     Time: [id number] PT Time Calculation (min) (ACUTE ONLY): 22 min  Charges:  $Gait Training: 8-22 mins                     Marisa Severin, PT, DPT Acute Rehabilitation Services Secure chat preferred Office [place] 07/17/2022, 9:55 AM

## 2022-07-18 DIAGNOSIS — N10 Acute tubulo-interstitial nephritis: Secondary | ICD-10-CM | POA: Diagnosis not present

## 2022-07-18 LAB — CBC WITH DIFFERENTIAL/PLATELET
Abs Immature Granulocytes: 0.05 10*3/uL (ref 0.00–0.07)
Basophils Absolute: 0.3 10*3/uL — ABNORMAL HIGH (ref 0.0–0.1)
Basophils Relative: 2 %
Eosinophils Absolute: 0.3 10*3/uL (ref 0.0–0.5)
Eosinophils Relative: 3 %
HCT: 21.4 % — ABNORMAL LOW (ref 36.0–46.0)
Hemoglobin: 7.1 g/dL — ABNORMAL LOW (ref 12.0–15.0)
Immature Granulocytes: 0 %
Lymphocytes Relative: 25 %
Lymphs Abs: 2.9 10*3/uL (ref 0.7–4.0)
MCH: 30.9 pg (ref 26.0–34.0)
MCHC: 33.2 g/dL (ref 30.0–36.0)
MCV: 93 fL (ref 80.0–100.0)
Monocytes Absolute: 1.6 10*3/uL — ABNORMAL HIGH (ref 0.1–1.0)
Monocytes Relative: 14 %
Neutro Abs: 6.5 10*3/uL (ref 1.7–7.7)
Neutrophils Relative %: 56 %
Platelets: 327 10*3/uL (ref 150–400)
RBC: 2.3 MIL/uL — ABNORMAL LOW (ref 3.87–5.11)
RDW: 18.7 % — ABNORMAL HIGH (ref 11.5–15.5)
WBC: 11.6 10*3/uL — ABNORMAL HIGH (ref 4.0–10.5)
nRBC: 0.3 % — ABNORMAL HIGH (ref 0.0–0.2)

## 2022-07-18 LAB — BASIC METABOLIC PANEL
Anion gap: 13 (ref 5–15)
BUN: 54 mg/dL — ABNORMAL HIGH (ref 8–23)
CO2: 21 mmol/L — ABNORMAL LOW (ref 22–32)
Calcium: 9.3 mg/dL (ref 8.9–10.3)
Chloride: 103 mmol/L (ref 98–111)
Creatinine, Ser: 3.79 mg/dL — ABNORMAL HIGH (ref 0.44–1.00)
GFR, Estimated: 11 mL/min — ABNORMAL LOW (ref >60)
Glucose, Bld: 93 mg/dL (ref 70–99)
Potassium: 4.2 mmol/L (ref 3.5–5.1)
Sodium: 137 mmol/L (ref 135–145)

## 2022-07-18 MED ORDER — FUROSEMIDE 10 MG/ML IJ SOLN
80.0000 mg | Freq: Two times a day (BID) | INTRAMUSCULAR | Status: DC
  Administered 2022-07-18 (×2): 80 mg via INTRAVENOUS
  Filled 2022-07-18 (×2): qty 8

## 2022-07-18 MED ORDER — APIXABAN 2.5 MG PO TABS
2.5000 mg | ORAL_TABLET | Freq: Two times a day (BID) | ORAL | Status: DC
  Administered 2022-07-19: 2.5 mg via ORAL
  Filled 2022-07-18: qty 1

## 2022-07-18 NOTE — Progress Notes (Signed)
PROGRESS NOTE    [name]  [id number] DOB: Jul 16, 1940 DOA: 07/02/2022 PCP: Cassandria Anger, MD   Brief Narrative:  [name] is a 82 y.o. female with a history of CKD stage IV, hypertension, rheumatoid arthritis, hypothyroidism, CAD s/p CABG, COPD, hyperlipidemia, DVT/PE. Patient presented secondary to weakness and fever and is found to have evidence of sepsis with a likely urinary source. Empiric antibiotics started and cultures obtained. Patient also found to have hyponatremia with associated dehydration and AKI. IV fluids started. Blood cultures significant for E. Coli bacteremia. Renal function improving with IV fluids. Workup significant for acute polynephritis/bacteremia, as well she did develop melena secondary to gastric/duodenal ulcers.   Assessment & Plan:   Principal Problem:   Acute pyelonephritis Active Problems:   Acute renal failure superimposed on stage 4 chronic kidney disease (HCC)   Acute metabolic encephalopathy   Hyponatremia   Sepsis with acute renal failure (HCC)   Acquired hypothyroidism   Essential hypertension   Rheumatoid arthritis (HCC)   Anticoagulated   Hx of CABG   Hyperlipidemia   COPD (chronic obstructive pulmonary disease) (HCC)   CKD (chronic kidney disease) stage 4, GFR 15-29 ml/min (HCC) - baseline SCr 2.5. follows with Dr. Royce Macadamia with [facility]   History of pulmonary embolism   Upper GI bleed   Melena   Metabolic acidosis, increased anion gap   Generalized weakness   DNR (do not resuscitate)/DNI(Do Not Intubate)   E coli bacteremia   Dark stools   Anemia   Acute gastric ulcer with hemorrhage   Duodenal ulcer   Sepsis due to acute hemorrhagic cystitis /right pyelonephritis with E. coli bacteremia Sepsis present on admission - Patient with non-specific symptoms but with associated fever and leukocytosis.  She was noted to have significant right flank pain/CVA tenderness on exam . Urinalysis suggests infection,  confirmed on urine culture. Patient started empirically on Ceftriaxone IV. Associated E. Coli bacteremia -on IV  Ceftriaxone 2 g IV, narrowed to IV cefazolin, then on Unasyn.  She completed total of 10 days of antibiotics   Acute blood loss anemia Acute upper GI bleed due to gastric ulcer/duodenal ulcers -Developed melena 3/8, Eliquis was held, GI input greatly appreciated, underwent EGD 07/08/2022, significant for gastric and duodenal ulcer, no evidence of active bleed, recommendation to hold anticoagulation for few days and for Protonix twice daily for 6 weeks and then once daily.  Eliquis resumed on 07/14/2022.  Hemoglobin continued to drop and 7.1 today.  Will hold Eliquis today and reassess tomorrow.  Check hemoglobin later today and tomorrow morning and transfuse if less than 7.    Hyponatremia Sodium of 124 on admission. Likely related to dehydration and hypovolemia, present on admission.  -Resolved with IV hydration.   Acute metabolic encephalopathy  Likely related to overall illness but more specifically dehydration and hyponatremia. Patient is from independent living.  She is now alert and oriented.   Acute renal failure superimposed on stage 4 chronic kidney disease (HCC) Metabolic acidosis, increased anion gap - baseline SCr 2.5. follows with Dr. Royce Macadamia with [facility] Creatinine of 4.70 on admission secondary to poor oral intake in addition to patient continuing diuretic use.  Creatinine improved to 2.8 but then has been creeping up for last few days and improved somewhat today and currently 3.57.  Bicarb 23 today..  Continue bicarb tablets.  Renal ultrasound negative for obstruction.  Creatinine improved and plateaued around 3.3 range for few days and then started creeping up and  currently 3.79.  Nephrology now managing.  Discussed with Dr. Joelyn Oms since I am wondering if Lasix drip would be better to have decent urine output with her rising creatinine however he is planning to do  twice daily bolus Lasix today and reassess tomorrow.  Defer management to nephrology.  Essential hypertension Patient blood pressure is now fairly controlled on increased dose of Coreg and home dose of amlodipine.  She is also on Lasix now.   Hypokalemia Resolved.   History of pulmonary embolism//long-term use of anticoagulants atient is on Eliquis as an outpatient for the indication of PE/DVT which does not qualify for dose reduction, however patient is on reduced dose of 2.5 mg BID as an outpatient; possibly related to history of GI bleeding.  Eliquis was initially held due to GI bleeding but eventually resumed on 07/14/2022.  Dropping hemoglobin so holding again today.   COPD (chronic obstructive pulmonary disease) (HCC) Stable. Asymptomatic.   Hyperlipidemia -Continue Lipitor daily   Hx of CABG Noted. No chest pain.   Rheumatoid arthritis ([place]) Patient is on [place] as an outpatient which is held secondary to active infection.   Acquired hypothyroidism -Continue Synthroid 88 mcg daily   Generalized weakness Secondary to acute illness. PT/OT recommending SNF.  Plan to discharge to SNF once medically stable.   Acute hypoxic respiratory failure Pneumonia Patient at times is requiring oxygen, especially when she is laying flat.  She is requiring 1 to 2 L of oxygen.  However she denies any shortness of breath when sitting up.  She has diminished breath sounds at bases, she has been advised multiple times and encouraged to use incentive spirometry but she is not following recommendations, she tends to forget.  Repeat chest x-ray on 07/15/2022 shows atelectasis as well and procalcitonin unremarkable indicates likely atelectasis clinically as well.  Disposition: Patient fluid overloaded, requires more IV diuresis.  Not medically stable for discharge yet.  TOC aware.  DVT prophylaxis: apixaban (ELIQUIS) tablet 2.5 mg Start: 07/19/22 2200 Place TED hose Start: 07/15/22 0954 SCDs Start:  07/03/22 0431   Code Status: DNR  Family Communication: Daughter present at bedside.  Plan of care discussed with patient in length and he/she verbalized understanding and agreed with it.  Status is: Inpatient Remains inpatient appropriate because: Needs more IV diuresis.   Estimated body mass index is 29.6 kg/m as calculated from the following:   Height as of this encounter: 5\' 3"  (1.6 m).   Weight as of this encounter: 75.8 kg.    Nutritional Assessment: Body mass index is 29.6 kg/m.[name] Seen by dietician.  I agree with the assessment and plan as outlined below: Nutrition Status:        . Skin Assessment: I have examined the patient's skin and I agree with the wound assessment as performed by the wound care RN as outlined below:    Consultants:  GI  Procedures:  CT  Antimicrobials:  Anti-infectives (From admission, onward)    Start     Dose/Rate Route Frequency Ordered Stop   07/08/22 1245  ampicillin-sulbactam (UNASYN) 1.5 g in sodium chloride 0.9 % 100 mL IVPB        1.5 g 200 mL/hr over 30 Minutes Intravenous Every 12 hours 07/08/22 1154 07/13/22 1030   07/05/22 2000  ceFAZolin (ANCEF) IVPB 2g/100 mL premix  Status:  Discontinued        2 g 200 mL/hr over 30 Minutes Intravenous Every 12 hours 07/05/22 0958 07/05/22 1004   07/05/22 1530  ceFAZolin (  ANCEF) IVPB 2g/100 mL premix  Status:  Discontinued        2 g 200 mL/hr over 30 Minutes Intravenous Every 12 hours 07/05/22 1004 07/08/22 1145   07/04/22 0000  cefTRIAXone (ROCEPHIN) 1 g in sodium chloride 0.9 % 100 mL IVPB  Status:  Discontinued        1 g 200 mL/hr over 30 Minutes Intravenous Every 24 hours 07/03/22 0211 07/03/22 1153   07/03/22 1600  cefTRIAXone (ROCEPHIN) 2 g in sodium chloride 0.9 % 100 mL IVPB  Status:  Discontinued        2 g 200 mL/hr over 30 Minutes Intravenous Every 24 hours 07/03/22 1153 07/05/22 0958   07/03/22 0215  cefTRIAXone (ROCEPHIN) 1 g in sodium chloride 0.9 % 100 mL IVPB        1  g 200 mL/hr over 30 Minutes Intravenous  Once 07/03/22 0212 07/03/22 0243   07/03/22 0145  vancomycin (VANCOREADY) IVPB 1250 mg/250 mL  Status:  Discontinued        1,250 mg 166.7 mL/hr over 90 Minutes Intravenous  Once 07/03/22 0132 07/03/22 0210   07/03/22 0130  ceFEPIme (MAXIPIME) 2 g in sodium chloride 0.9 % 100 mL IVPB  Status:  Discontinued        2 g 200 mL/hr over 30 Minutes Intravenous  Once 07/03/22 0124 07/03/22 0210   07/03/22 0130  metroNIDAZOLE (FLAGYL) IVPB 500 mg  Status:  Discontinued        500 mg 100 mL/hr over 60 Minutes Intravenous  Once 07/03/22 0124 07/03/22 0210   07/03/22 0130  vancomycin (VANCOCIN) IVPB 1000 mg/200 mL premix  Status:  Discontinued        1,000 mg 200 mL/hr over 60 Minutes Intravenous  Once 07/03/22 0124 07/03/22 0130         Subjective:  Seen and examined the patient has no complaints.  She is just getting frustrated for staying in the hospital for a long time.  But she is still is very pleasant.  Objective: Vitals:   07/18/22 0000 07/18/22 0427 07/18/22 0428 07/18/22 0800  BP: (!) 131/55 (!) 159/69 (!) 159/69 (!) 148/120  Pulse: 84 92 96 83  Resp: (!) 23 20    Temp:  98.3 F (36.8 C)  98.8 F (37.1 C)  TempSrc:  Oral  Oral  SpO2: (!) 87%  (!) 87% (!) 89%  Weight:   75.8 kg   Height:        Intake/Output Summary (Last 24 hours) at 07/18/2022 0955 Last data filed at 07/18/2022 0024 Gross per 24 hour  Intake --  Output 750 ml  Net -750 ml    Filed Weights   07/15/22 0500 07/16/22 0500 07/18/22 0428  Weight: 75.9 kg 75.5 kg 75.8 kg    Examination:  General exam: Appears calm and comfortable  Respiratory system: Clear to auscultation. Respiratory effort normal. Cardiovascular system: S1 & S2 heard, RRR. No JVD, murmurs, rubs, gallops or clicks.  +3 pitting edema bilateral lower extremity and +1 pitting edema bilateral upper extremity Gastrointestinal system: Abdomen is nondistended, soft and nontender. No organomegaly or  masses felt. Normal bowel sounds heard. Central nervous system: Alert and oriented. No focal neurological deficits. Extremities: Symmetric 5 x 5 power. Skin: No rashes, lesions or ulcers.  Psychiatry: Judgement and insight appear normal. Mood & affect appropriate.   Data Reviewed: I have personally reviewed following labs and imaging studies  CBC: Recent Labs  Lab 07/12/22 0311 07/14/22 0747 07/17/22 0300  07/18/22 0337  WBC 15.6* 14.7* 11.8* 11.6*  NEUTROABS  --  10.4* 6.4 6.5  HGB 7.8* 8.1* 7.3* 7.1*  HCT 23.0* 24.9* 22.2* 21.4*  MCV 92.0 95.4 93.7 93.0  PLT 310 375 354 [id number]    Basic Metabolic Panel: Recent Labs  Lab 07/14/22 0747 07/15/22 0337 07/16/22 0759 07/17/22 0300 07/18/22 0337  NA 139 139 142 136 137  K 3.6 3.3* 3.9 3.8 4.2  CL 106 104 106 103 103  CO2 19* 23 22 22  21*  GLUCOSE 124* 106* 108* 114* 93  BUN 43* 45* 44* 47* 54*  CREATININE 3.29* 3.32* 3.40* 3.61* 3.79*  CALCIUM 9.4 9.0 9.7 9.4 9.3    GFR: Estimated Creatinine Clearance: 10.8 mL/min (A) (by C-G formula based on SCr of 3.79 mg/dL (H)). Liver Function Tests: Recent Labs  Lab 07/14/22 0747  AST 13*  ALT 5  ALKPHOS 88  BILITOT 0.9  PROT 6.3*  ALBUMIN 3.4*    No results for input(s): "LIPASE", "AMYLASE" in the last 168 hours. No results for input(s): "AMMONIA" in the last 168 hours. Coagulation Profile: No results for input(s): "INR", "PROTIME" in the last 168 hours. Cardiac Enzymes: No results for input(s): "CKTOTAL", "CKMB", "CKMBINDEX", "TROPONINI" in the last 168 hours. BNP (last 3 results) No results for input(s): "PROBNP" in the last 8760 hours. HbA1C: No results for input(s): "HGBA1C" in the last 72 hours. CBG: No results for input(s): "GLUCAP" in the last 168 hours. Lipid Profile: No results for input(s): "CHOL", "HDL", "LDLCALC", "TRIG", "CHOLHDL", "LDLDIRECT" in the last 72 hours. Thyroid Function Tests: No results for input(s): "TSH", "T4TOTAL", "FREET4", "T3FREE",  "THYROIDAB" in the last 72 hours. Anemia Panel: No results for input(s): "VITAMINB12", "FOLATE", "FERRITIN", "TIBC", "IRON", "RETICCTPCT" in the last 72 hours. Sepsis Labs: Recent Labs  Lab 07/12/22 0826 07/16/22 0759  PROCALCITON 0.70 0.18     No results found for this or any previous visit (from the past 240 hour(s)).    Radiology Studies: No results found.  Scheduled Meds:  acidophilus  1 capsule Oral TID   amLODipine  10 mg Oral Daily   [START ON 07/19/2022] apixaban  2.5 mg Oral BID   atorvastatin  80 mg Oral Daily   carvedilol  6.25 mg Oral BID WC   furosemide  80 mg Intravenous BID   levothyroxine  88 mcg Oral QAC breakfast   pantoprazole  40 mg Oral BID   QUEtiapine  25 mg Oral Q24H   triamcinolone  1 spray Each Nare Daily   Continuous Infusions:     LOS: 15 days   Darliss Cheney, MD [facility]  07/18/2022, 9:55 AM   *Please note that this is a verbal dictation therefore any spelling or grammatical errors are due to the "[place]" system interpretation.  Please page via [place] and do not message via secure chat for urgent patient care matters. Secure chat can be used for non urgent patient care matters.  How to contact the [facility] Attending or Consulting provider [place] or covering provider during after hours [place], for this patient?  Check the care team in [facility] and look for a) attending/consulting [facility] provider listed and b) the [facility] team listed. Page or secure chat [phone]. Log into [email or web address] and use [place]'s universal password to access. If you do not have the password, please contact the hospital operator. Locate the [facility] provider you are looking for under [facility] and page to a number that you can be directly  reached. If you still have difficulty reaching the provider, please page the [facility] (Director on Call) for the Hospitalists listed on amion for assistance.

## 2022-07-18 NOTE — Progress Notes (Signed)
Admit: 07/02/2022 LOS: 15  [name] is an 82yo female with history of CKD IV who follows with Dr. Royce Macadamia, HTN, RA, hypothyroidism, CAD s/p CABG, COPD, HLD, DVT/PE who presented with weakness and fever, found to have pyelonephritis and urosepsis. Hospital course complicated by acute blood loss anemia 2/2 gastric/duodenal ulcers. Nephrology consulted due to AKI on CKD IV.   Subjective:  Feeling about the same compared to yesterday. Does think her swelling is perhaps a bit worse, especially in the upper extremities. Tells me she required O2 briefly overnight, but at the time of my assessment was ambulating in the hallway on room air.   03/18 0701 - 03/19 0700 In: -  Out: 750 [Urine:750]  Filed Weights   07/15/22 0500 07/16/22 0500 07/18/22 0428  Weight: 75.9 kg 75.5 kg 75.8 kg    Scheduled Meds:  acidophilus  1 capsule Oral TID   amLODipine  10 mg Oral Daily   apixaban  2.5 mg Oral BID   atorvastatin  80 mg Oral Daily   carvedilol  6.25 mg Oral BID WC   levothyroxine  88 mcg Oral QAC breakfast   pantoprazole  40 mg Oral BID   QUEtiapine  25 mg Oral Q24H   triamcinolone  1 spray Each Nare Daily   Continuous Infusions: PRN Meds:.acetaminophen **OR** acetaminophen, hydrALAZINE, hydrALAZINE, ondansetron **OR** ondansetron (ZOFRAN) IV, polyethylene glycol, witch hazel-glycerin  Current Labs: reviewed    Physical Exam:  Blood pressure (!) 159/69, pulse 96, temperature 98.3 F (36.8 C), temperature source Oral, resp. rate 20, height 5\' 3"  (1.6 m), weight 75.8 kg, SpO2 (!) 87 %. Gen: Ambulating in hallway with walker, NAD Pulm: Increased WOB with effort, speaking in 3-4 word phrases Cardio: RRR, no m/r/g, 2+ pitting edema to BUE and BLE, arm edema does seem worse compared to my previous exams Abd: Mildly distended without tenderness or fluid wave   A & P   AKI on CKD IV Received 80mg  IV Lasix x1 yesterday with minimal effect. Unfortunately had several undocumented urine events, so  uncertain true UOP. Cr creeping upwards. 3.61>3.79. BUN uptrending to 54 but question if this may reflect a UGI bleed given recurrence of the same during this hospitalization and concomitant drop in hemoglobin.  Remains without an indication for dialysis. Will continue to diurese and monitor. - IV Lasix 80mg  BID - Trend labs tomorrow, will check an albumin as well  - Follow strict I/O - Avoid nephrotoxic meds  Acute Blood Loss Anemia Hgb to 7.1 this am. Per primary team note, plan to recheck Hgb this pm. Holding Eliquis. - Per primary  HTN Remains reasonably well-controlled. - Continue current management with amlodipine and Coreg per primary  [name] MD 07/18/2022, 7:54 AM  Recent Labs  Lab 07/16/22 0759 07/17/22 0300 07/18/22 0337  NA 142 136 137  K 3.9 3.8 4.2  CL 106 103 103  CO2 22 22 21*  GLUCOSE 108* 114* 93  BUN 44* 47* 54*  CREATININE 3.40* 3.61* 3.79*  CALCIUM 9.7 9.4 9.3   Recent Labs  Lab 07/14/22 0747 07/17/22 0300 07/18/22 0337  WBC 14.7* 11.8* 11.6*  NEUTROABS 10.4* 6.4 6.5  HGB 8.1* 7.3* 7.1*  HCT 24.9* 22.2* 21.4*  MCV 95.4 93.7 93.0  PLT 375 354 327

## 2022-07-18 NOTE — Plan of Care (Signed)
  Problem: Education: Goal: Knowledge of General Education information will improve Description: Including pain rating scale, medication(s)/side effects and non-pharmacologic comfort measures 07/18/2022 0027 by Gena Fray, RN Outcome: Progressing 07/18/2022 0027 by Gena Fray, RN Outcome: Progressing   Problem: Health Behavior/Discharge Planning: Goal: Ability to manage health-related needs will improve 07/18/2022 0027 by Gena Fray, RN Outcome: Progressing 07/18/2022 0027 by Gena Fray, RN Outcome: Progressing   Problem: Clinical Measurements: Goal: Ability to maintain clinical measurements within normal limits will improve 07/18/2022 0027 by Gena Fray, RN Outcome: Progressing 07/18/2022 0027 by Gena Fray, RN Outcome: Progressing Goal: Will remain free from infection 07/18/2022 0027 by Gena Fray, RN Outcome: Progressing 07/18/2022 0027 by Gena Fray, RN Outcome: Progressing Goal: Diagnostic test results will improve 07/18/2022 0027 by Gena Fray, RN Outcome: Progressing 07/18/2022 0027 by Gena Fray, RN Outcome: Progressing Goal: Respiratory complications will improve 07/18/2022 0027 by Gena Fray, RN Outcome: Progressing 07/18/2022 0027 by Gena Fray, RN Outcome: Progressing Goal: Cardiovascular complication will be avoided 07/18/2022 0027 by Gena Fray, RN Outcome: Progressing 07/18/2022 0027 by Gena Fray, RN Outcome: Progressing

## 2022-07-18 NOTE — Progress Notes (Signed)
Physical Therapy Treatment Patient Details Name: [name] MRN: [id number] DOB: 11/05/1940 Today's Date: 07/18/2022   History of Present Illness 82 y.o. female presents to [facility] hospital on 07/02/2022 with worsening weakness. Pt admitted for management of acute cystitis. PMH includes CKD IV, HTN, RA, hypothyroidism, CABG, COPD, HLD, PE.    PT Comments    Pt reports she has been mobilizing well with no O2 needs. Pt ambulatory in hallway with PT at close guard for safety, with cues for form throughout. SPO2 maintained 89% and greater on RA, other vss. Pt requires standing rest breaks throughout for fatigue, states she uses a rollator as needed for this purpose. PT to continue to follow.      Recommendations for follow up therapy are one component of a multi-disciplinary discharge planning process, led by the attending physician.  Recommendations may be updated based on patient status, additional functional criteria and insurance authorization.  Follow Up Recommendations  Home health PT Can patient physically be transported by private vehicle: Yes   Assistance Recommended at Discharge Intermittent Supervision/Assistance  Patient can return home with the following Help with stairs or ramp for entrance;Assist for transportation;Assistance with cooking/housework   Equipment Recommendations  None recommended by PT    Recommendations for Other Services       Precautions / Restrictions Precautions Precautions: Fall;Other (comment) Precaution Comments: watch 02/RR Restrictions Weight Bearing Restrictions: No     Mobility  Bed Mobility Overal bed mobility: Needs Assistance Bed Mobility: Supine to Sit     Supine to sit: Min guard, HOB elevated     General bed mobility comments: for safety, use of bedrail and increased time    Transfers Overall transfer level: Needs assistance Equipment used: Rolling walker (2 wheels) Transfers: Sit to/from Stand Sit to Stand: Min guard            General transfer comment: close guard for safety, cues for hand placement when rising. sit<>stand x3, from EOB x2 and BSC x1.    Ambulation/Gait Ambulation/Gait assistance: Min guard Gait Distance (Feet): 150 Feet ([id number] with standing rest breaks) Assistive device: Rolling walker (2 wheels) Gait Pattern/deviations: Step-through pattern, Decreased stride length, Trunk flexed Gait velocity: decr     General Gait Details: cues for upright posture and proximity to RW. x2 propped on forearm rest breaks, SPO2 89% and greater on RA   Stairs             Wheelchair Mobility    Modified Rankin (Stroke Patients Only)       Balance Overall balance assessment: Needs assistance Sitting-balance support: Feet supported, No upper extremity supported Sitting balance-Leahy Scale: Good     Standing balance support: During functional activity Standing balance-Leahy Scale: Fair                              Cognition Arousal/Alertness: Awake/alert Behavior During Therapy: WFL for tasks assessed/performed Overall Cognitive Status: Impaired/Different from baseline Area of Impairment: Following commands, Awareness, Problem solving                   Current Attention Level: Alternating, Selective   Following Commands: Follows one step commands with increased time   Awareness: Emergent Problem Solving: Slow processing, Decreased initiation          Exercises      General Comments General comments (skin integrity, edema, etc.): Pt dependent on PT for clean up after urination and BM  Pertinent Vitals/Pain Pain Assessment Pain Assessment: Faces Faces Pain Scale: Hurts a little bit Pain Location: all over Pain Descriptors / Indicators: Sore Pain Intervention(s): Limited activity within patient's tolerance, Monitored during session, Repositioned    Home Living                          Prior Function            PT Goals (current  goals can now be found in the care plan section) Acute Rehab PT Goals Patient Stated Goal: to go home PT Goal Formulation: With patient Time For Goal Achievement: 07/31/22 Potential to Achieve Goals: Good Progress towards PT goals: Progressing toward goals    Frequency    Min 3X/week      PT Plan Current plan remains appropriate    Co-evaluation              AM-PAC PT "6 Clicks" Mobility   Outcome Measure  Help needed turning from your back to your side while in a flat bed without using bedrails?: A Little Help needed moving from lying on your back to sitting on the side of a flat bed without using bedrails?: A Little Help needed moving to and from a bed to a chair (including a wheelchair)?: A Little Help needed standing up from a chair using your arms (e.g., wheelchair or bedside chair)?: A Little Help needed to walk in hospital room?: A Little Help needed climbing 3-5 steps with a railing? : A Little 6 Click Score: 18    End of Session Equipment Utilized During Treatment: Gait belt Activity Tolerance: Patient tolerated treatment well Patient left: in chair;with call bell/phone within reach;with chair alarm set;with family/visitor present Nurse Communication: Mobility status PT Visit Diagnosis: Other abnormalities of gait and mobility (R26.89);Muscle weakness (generalized) (M62.81);Repeated falls (R29.6)     Time: [id number] PT Time Calculation (min) (ACUTE ONLY): 24 min  Charges:  $Gait Training: 8-22 mins $Therapeutic Activity: 8-22 mins                     Stacie Glaze, PT DPT Acute Rehabilitation Services Pager [phone]  Office [phone]    [place] 07/18/2022, 3:45 PM

## 2022-07-18 NOTE — Plan of Care (Signed)

## 2022-07-19 DIAGNOSIS — N10 Acute tubulo-interstitial nephritis: Secondary | ICD-10-CM | POA: Diagnosis not present

## 2022-07-19 LAB — CBC WITH DIFFERENTIAL/PLATELET
Abs Immature Granulocytes: 0.03 10*3/uL (ref 0.00–0.07)
Basophils Absolute: 0.3 10*3/uL — ABNORMAL HIGH (ref 0.0–0.1)
Basophils Relative: 3 %
Eosinophils Absolute: 0.4 10*3/uL (ref 0.0–0.5)
Eosinophils Relative: 3 %
HCT: 20.5 % — ABNORMAL LOW (ref 36.0–46.0)
Hemoglobin: 7 g/dL — ABNORMAL LOW (ref 12.0–15.0)
Immature Granulocytes: 0 %
Lymphocytes Relative: 22 %
Lymphs Abs: 2.4 10*3/uL (ref 0.7–4.0)
MCH: 31.7 pg (ref 26.0–34.0)
MCHC: 34.1 g/dL (ref 30.0–36.0)
MCV: 92.8 fL (ref 80.0–100.0)
Monocytes Absolute: 1.8 10*3/uL — ABNORMAL HIGH (ref 0.1–1.0)
Monocytes Relative: 17 %
Neutro Abs: 6 10*3/uL (ref 1.7–7.7)
Neutrophils Relative %: 55 %
Platelets: 285 10*3/uL (ref 150–400)
RBC: 2.21 MIL/uL — ABNORMAL LOW (ref 3.87–5.11)
RDW: 18.6 % — ABNORMAL HIGH (ref 11.5–15.5)
WBC: 10.8 10*3/uL — ABNORMAL HIGH (ref 4.0–10.5)
nRBC: 0.5 % — ABNORMAL HIGH (ref 0.0–0.2)

## 2022-07-19 LAB — BASIC METABOLIC PANEL
Anion gap: 11 (ref 5–15)
BUN: 56 mg/dL — ABNORMAL HIGH (ref 8–23)
CO2: 23 mmol/L (ref 22–32)
Calcium: 9.3 mg/dL (ref 8.9–10.3)
Chloride: 103 mmol/L (ref 98–111)
Creatinine, Ser: 3.98 mg/dL — ABNORMAL HIGH (ref 0.44–1.00)
GFR, Estimated: 11 mL/min — ABNORMAL LOW (ref >60)
Glucose, Bld: 97 mg/dL (ref 70–99)
Potassium: 3.3 mmol/L — ABNORMAL LOW (ref 3.5–5.1)
Sodium: 137 mmol/L (ref 135–145)

## 2022-07-19 LAB — PREPARE RBC (CROSSMATCH)

## 2022-07-19 LAB — HEMOGLOBIN AND HEMATOCRIT, BLOOD
HCT: 22 % — ABNORMAL LOW (ref 36.0–46.0)
Hemoglobin: 7.5 g/dL — ABNORMAL LOW (ref 12.0–15.0)

## 2022-07-19 LAB — ALBUMIN: Albumin: 2.8 g/dL — ABNORMAL LOW (ref 3.5–5.0)

## 2022-07-19 MED ORDER — SODIUM CHLORIDE 0.9% IV SOLUTION: Freq: Once | INTRAVENOUS | Status: AC

## 2022-07-19 MED ORDER — ORAL CARE MOUTH RINSE: 15.0000 mL | OROMUCOSAL | Status: DC | PRN

## 2022-07-19 NOTE — Progress Notes (Signed)
PROGRESS NOTE    [name]  [id number] DOB: Apr 06, 1941 DOA: 07/02/2022 PCP: Cassandria Anger, MD   Brief Narrative:  [name] is a 82 y.o. female with a history of CKD stage IV, hypertension, rheumatoid arthritis, hypothyroidism, CAD s/p CABG, COPD, hyperlipidemia, DVT/PE. Patient presented secondary to weakness and fever and is found to have evidence of sepsis with a likely urinary source. Empiric antibiotics started and cultures obtained. Patient also found to have hyponatremia with associated dehydration and AKI. IV fluids started. Workup significant for acute polynephritis/bacteremia due to E. coli for which she completed antibiotics, as well she did develop melena secondary to gastric/duodenal ulcers.  Eliquis discontinued, then resumed and then discontinued, details below.  Currently has been having issues with volume overload and rising creatinine and nephrology managing that.  Assessment & Plan:   Principal Problem:   Acute pyelonephritis Active Problems:   Acute renal failure superimposed on stage 4 chronic kidney disease (HCC)   Acute metabolic encephalopathy   Hyponatremia   Sepsis with acute renal failure (HCC)   Acquired hypothyroidism   Essential hypertension   Rheumatoid arthritis (HCC)   Anticoagulated   Hx of CABG   Hyperlipidemia   COPD (chronic obstructive pulmonary disease) (HCC)   CKD (chronic kidney disease) stage 4, GFR 15-29 ml/min (HCC) - baseline SCr 2.5. follows with Dr. Royce Macadamia with [facility]   History of pulmonary embolism   Upper GI bleed   Melena   Metabolic acidosis, increased anion gap   Generalized weakness   DNR (do not resuscitate)/DNI(Do Not Intubate)   E coli bacteremia   Dark stools   Anemia   Acute gastric ulcer with hemorrhage   Duodenal ulcer   Sepsis due to acute hemorrhagic cystitis /right pyelonephritis with E. coli bacteremia Sepsis present on admission - Patient with non-specific symptoms but with  associated fever and leukocytosis.  She was noted to have significant right flank pain/CVA tenderness on exam . Urinalysis suggests infection, confirmed on urine culture. Patient started empirically on Ceftriaxone IV. Associated E. Coli bacteremia -on IV  Ceftriaxone 2 g IV, narrowed to IV cefazolin, then on Unasyn.  She completed total of 10 days of antibiotics   Acute blood loss anemia Acute upper GI bleed due to gastric ulcer/duodenal ulcers -Developed melena 3/8, Eliquis was held, GI input greatly appreciated, underwent EGD 07/08/2022, significant for gastric and duodenal ulcer, no evidence of active bleed, recommendation to hold anticoagulation for few days and for Protonix twice daily for 6 weeks and then once daily.  Eliquis resumed on 07/14/2022.  Hemoglobin continued to drop and 7.1 yesterday, repeat labs today, transfuse if less than 7.     Hyponatremia Sodium of 124 on admission. Likely related to dehydration and hypovolemia, present on admission.  -Resolved with IV hydration.   Acute metabolic encephalopathy  Likely related to overall illness but more specifically dehydration and hyponatremia. Patient is from independent living.  She is now alert and oriented.   Acute renal failure superimposed on stage 4 chronic kidney disease (HCC) Metabolic acidosis, increased anion gap/fluid overload - baseline SCr 2.5. follows with Dr. Royce Macadamia with [facility] Creatinine of 4.70 on admission secondary to poor oral intake in addition to patient continuing diuretic use.  Creatinine improved to 2.8 but then has been creeping up for last few days and improved somewhat today and currently 3.57.  Bicarb 23 today..  Continue bicarb tablets.  Renal ultrasound negative for obstruction.  Creatinine improved and plateaued around 3.3 range  for few days and then started creeping up and currently 3.98.  Nephrology now managing.  Discussed with Dr. Joelyn Oms yesterday that I am wondering if Lasix drip would be  better to have decent urine output with her rising creatinine however he is planning to do twice daily bolus Lasix today and today.  He has now decided to hold Lasix for 24 hours and reassess tomorrow.  Essential hypertension Patient blood pressure is now fairly controlled on increased dose of Coreg and home dose of amlodipine.     Hypokalemia Resolved.   History of pulmonary embolism//long-term use of anticoagulants atient is on Eliquis as an outpatient for the indication of PE/DVT which does not qualify for dose reduction, however patient is on reduced dose of 2.5 mg BID as an outpatient; possibly related to history of GI bleeding.  Eliquis was initially held due to GI bleeding but eventually resumed on 07/14/2022.  Dropping hemoglobin so holding Eliquis.  Plan to resume Eliquis tonight if hemoglobin stable or improved.   COPD (chronic obstructive pulmonary disease) (HCC) Stable. Asymptomatic.   Hyperlipidemia -Continue Lipitor daily   Hx of CABG Noted. No chest pain.   Rheumatoid arthritis ([place]) Patient is on Hayden as an outpatient which is held secondary to active infection.   Acquired hypothyroidism -Continue Synthroid 88 mcg daily   Generalized weakness Secondary to acute illness. PT/OT recommending SNF.  Plan to discharge to SNF once medically stable.   Acute hypoxic respiratory failure Pneumonia Patient at times is requiring oxygen, especially when she is laying flat.  She is requiring 1 to 2 L of oxygen.  However she denies any shortness of breath when sitting up.  She has diminished breath sounds at bases, she has been advised multiple times and encouraged to use incentive spirometry but she is not following recommendations, she tends to forget.  Repeat chest x-ray on 07/15/2022 shows atelectasis as well and procalcitonin unremarkable indicates likely atelectasis clinically as well.  Disposition: Patient fluid overloaded, requires more IV diuresis.  Not medically stable for  discharge yet.  TOC aware.  DVT prophylaxis: apixaban (ELIQUIS) tablet 2.5 mg Start: 07/19/22 2200 Place TED hose Start: 07/15/22 0954 SCDs Start: 07/03/22 0431   Code Status: DNR  Family Communication: Daughter present at bedside.  Plan of care discussed with patient in length and he/she verbalized understanding and agreed with it.  Status is: Inpatient Remains inpatient appropriate because: Creatinine rising and has fluid overload.   Estimated body mass index is 29.6 kg/m as calculated from the following:   Height as of this encounter: 5\' 3"  (1.6 m).   Weight as of this encounter: 75.8 kg.    Nutritional Assessment: Body mass index is 29.6 kg/m.[name] Seen by dietician.  I agree with the assessment and plan as outlined below: Nutrition Status:        . Skin Assessment: I have examined the patient's skin and I agree with the wound assessment as performed by the wound care RN as outlined below:    Consultants:  GI  Procedures:  As above  Antimicrobials:  Anti-infectives (From admission, onward)    Start     Dose/Rate Route Frequency Ordered Stop   07/08/22 1245  ampicillin-sulbactam (UNASYN) 1.5 g in sodium chloride 0.9 % 100 mL IVPB        1.5 g 200 mL/hr over 30 Minutes Intravenous Every 12 hours 07/08/22 1154 07/13/22 1030   07/05/22 2000  ceFAZolin (ANCEF) IVPB 2g/100 mL premix  Status:  Discontinued  2 g 200 mL/hr over 30 Minutes Intravenous Every 12 hours 07/05/22 0958 07/05/22 1004   07/05/22 1530  ceFAZolin (ANCEF) IVPB 2g/100 mL premix  Status:  Discontinued        2 g 200 mL/hr over 30 Minutes Intravenous Every 12 hours 07/05/22 1004 07/08/22 1145   07/04/22 0000  cefTRIAXone (ROCEPHIN) 1 g in sodium chloride 0.9 % 100 mL IVPB  Status:  Discontinued        1 g 200 mL/hr over 30 Minutes Intravenous Every 24 hours 07/03/22 0211 07/03/22 1153   07/03/22 1600  cefTRIAXone (ROCEPHIN) 2 g in sodium chloride 0.9 % 100 mL IVPB  Status:  Discontinued        2  g 200 mL/hr over 30 Minutes Intravenous Every 24 hours 07/03/22 1153 07/05/22 0958   07/03/22 0215  cefTRIAXone (ROCEPHIN) 1 g in sodium chloride 0.9 % 100 mL IVPB        1 g 200 mL/hr over 30 Minutes Intravenous  Once 07/03/22 0212 07/03/22 0243   07/03/22 0145  vancomycin (VANCOREADY) IVPB 1250 mg/250 mL  Status:  Discontinued        1,250 mg 166.7 mL/hr over 90 Minutes Intravenous  Once 07/03/22 0132 07/03/22 0210   07/03/22 0130  ceFEPIme (MAXIPIME) 2 g in sodium chloride 0.9 % 100 mL IVPB  Status:  Discontinued        2 g 200 mL/hr over 30 Minutes Intravenous  Once 07/03/22 0124 07/03/22 0210   07/03/22 0130  metroNIDAZOLE (FLAGYL) IVPB 500 mg  Status:  Discontinued        500 mg 100 mL/hr over 60 Minutes Intravenous  Once 07/03/22 0124 07/03/22 0210   07/03/22 0130  vancomycin (VANCOCIN) IVPB 1000 mg/200 mL premix  Status:  Discontinued        1,000 mg 200 mL/hr over 60 Minutes Intravenous  Once 07/03/22 0124 07/03/22 0130         Subjective:  Patient seen and examined.  She has no complaints.  Denies shortness of breath.  Objective: Vitals:   07/18/22 2017 07/19/22 0142 07/19/22 0400 07/19/22 0758  BP: (!) 131/50 (!) 145/58 (!) 148/59 131/66  Pulse: 86 88 83 88  Resp: 20 20 20 20   Temp: 98.4 F (36.9 C) 98 F (36.7 C) 98.4 F (36.9 C) 98.2 F (36.8 C)  TempSrc: Oral Axillary Axillary   SpO2: (!) 88%  91% 96%  Weight:      Height:        Intake/Output Summary (Last 24 hours) at 07/19/2022 1102 Last data filed at 07/18/2022 1500 Gross per 24 hour  Intake --  Output 150 ml  Net -150 ml    Filed Weights   07/15/22 0500 07/16/22 0500 07/18/22 0428  Weight: 75.9 kg 75.5 kg 75.8 kg    Examination:  General exam: Appears calm and comfortable  Respiratory system: Clear to auscultation. Respiratory effort normal. Cardiovascular system: S1 & S2 heard, RRR. No JVD, murmurs, rubs, gallops or clicks.  +3 pitting edema bilateral lower extremity and +1 pitting edema  bilateral upper extremity. Gastrointestinal system: Abdomen is nondistended, soft and nontender. No organomegaly or masses felt. Normal bowel sounds heard. Central nervous system: Alert and oriented. No focal neurological deficits. Extremities: Symmetric 5 x 5 power. Skin: No rashes, lesions or ulcers.  Psychiatry: Judgement and insight appear normal. Mood & affect appropriate.   Data Reviewed: I have personally reviewed following labs and imaging studies  CBC: Recent Labs  Lab 07/14/22  [id number] 07/17/22 0300 07/18/22 0337  WBC 14.7* 11.8* 11.6*  NEUTROABS 10.4* 6.4 6.5  HGB 8.1* 7.3* 7.1*  HCT 24.9* 22.2* 21.4*  MCV 95.4 93.7 93.0  PLT 375 354 [id number]    Basic Metabolic Panel: Recent Labs  Lab 07/15/22 0337 07/16/22 0759 07/17/22 0300 07/18/22 0337 07/19/22 0237  NA 139 142 136 137 137  K 3.3* 3.9 3.8 4.2 3.3*  CL 104 106 103 103 103  CO2 23 22 22  21* 23  GLUCOSE 106* 108* 114* 93 97  BUN 45* 44* 47* 54* 56*  CREATININE 3.32* 3.40* 3.61* 3.79* 3.98*  CALCIUM 9.0 9.7 9.4 9.3 9.3    GFR: Estimated Creatinine Clearance: 10.3 mL/min (A) (by C-G formula based on SCr of 3.98 mg/dL (H)). Liver Function Tests: Recent Labs  Lab 07/14/22 0747 07/19/22 0237  AST 13*  --   ALT 5  --   ALKPHOS 88  --   BILITOT 0.9  --   PROT 6.3*  --   ALBUMIN 3.4* 2.8*    No results for input(s): "LIPASE", "AMYLASE" in the last 168 hours. No results for input(s): "AMMONIA" in the last 168 hours. Coagulation Profile: No results for input(s): "INR", "PROTIME" in the last 168 hours. Cardiac Enzymes: No results for input(s): "CKTOTAL", "CKMB", "CKMBINDEX", "TROPONINI" in the last 168 hours. BNP (last 3 results) No results for input(s): "PROBNP" in the last 8760 hours. HbA1C: No results for input(s): "HGBA1C" in the last 72 hours. CBG: No results for input(s): "GLUCAP" in the last 168 hours. Lipid Profile: No results for input(s): "CHOL", "HDL", "LDLCALC", "TRIG", "CHOLHDL", "LDLDIRECT"  in the last 72 hours. Thyroid Function Tests: No results for input(s): "TSH", "T4TOTAL", "FREET4", "T3FREE", "THYROIDAB" in the last 72 hours. Anemia Panel: No results for input(s): "VITAMINB12", "FOLATE", "FERRITIN", "TIBC", "IRON", "RETICCTPCT" in the last 72 hours. Sepsis Labs: Recent Labs  Lab 07/16/22 0759  PROCALCITON 0.18     No results found for this or any previous visit (from the past 240 hour(s)).    Radiology Studies: No results found.  Scheduled Meds:  acidophilus  1 capsule Oral TID   amLODipine  10 mg Oral Daily   apixaban  2.5 mg Oral BID   atorvastatin  80 mg Oral Daily   carvedilol  6.25 mg Oral BID WC   levothyroxine  88 mcg Oral QAC breakfast   pantoprazole  40 mg Oral BID   QUEtiapine  25 mg Oral Q24H   triamcinolone  1 spray Each Nare Daily   Continuous Infusions:     LOS: 16 days   Darliss Cheney, MD [facility]  07/19/2022, 11:02 AM   *Please note that this is a verbal dictation therefore any spelling or grammatical errors are due to the "[place] One" system interpretation.  Please page via [place] and do not message via secure chat for urgent patient care matters. Secure chat can be used for non urgent patient care matters.  How to contact the [facility] Attending or Consulting provider [place] or covering provider during after hours [place], for this patient?  Check the care team in [facility] and look for a) attending/consulting [facility] provider listed and b) the [facility] team listed. Page or secure chat [phone]. Log into [email or web address] and use [place]'s universal password to access. If you do not have the password, please contact the hospital operator. Locate the [facility] provider you are looking for under [facility] and page to a number that you can be directly reached.  If you still have difficulty reaching the provider, please page the [facility] (Director on Call) for the Hospitalists listed on amion for assistance.

## 2022-07-19 NOTE — Progress Notes (Signed)
Admit: 07/02/2022 LOS: 80  Ms. Buchalski is an 82yo female with history of CKD IV who follows with Dr. Royce Macadamia, HTN, RA, hypothyroidism, CAD s/p CABG, COPD, HLD, DVT/PE who presented with weakness and fever, found to have pyelonephritis and urosepsis. Hospital course complicated by acute blood loss anemia 2/2 gastric/duodenal ulcers. Nephrology consulted due to AKI on CKD IV.   Subjective:  Disappointed by lack of progress. Feeling about the same today as yesterday. Swelling is unchanged.  Despite only 132mL UOP documented, she insists that she did have several voids overnight that were not documented.    03/19 0701 - 03/20 0700 In: -  Out: 150 [Urine:150]  Filed Weights   07/15/22 0500 07/16/22 0500 07/18/22 0428  Weight: 75.9 kg 75.5 kg 75.8 kg    Scheduled Meds:  acidophilus  1 capsule Oral TID   amLODipine  10 mg Oral Daily   apixaban  2.5 mg Oral BID   atorvastatin  80 mg Oral Daily   carvedilol  6.25 mg Oral BID WC   levothyroxine  88 mcg Oral QAC breakfast   pantoprazole  40 mg Oral BID   QUEtiapine  25 mg Oral Q24H   triamcinolone  1 spray Each Nare Daily   Continuous Infusions: PRN Meds:.acetaminophen **OR** acetaminophen, hydrALAZINE, hydrALAZINE, ondansetron **OR** ondansetron (ZOFRAN) IV, polyethylene glycol, witch hazel-glycerin  Current Labs: reviewed . Cr uptrending 3.79>3.98.     Physical Exam:  Blood pressure 131/66, pulse 88, temperature 98.4 F (36.9 C), temperature source Axillary, resp. rate 20, height 5\' 3"  (1.6 m), weight 75.8 kg, SpO2 96 %. Gen: Elderly Female, somewhat withdrawn today Cardio: RRR, no murmur, 2+ pitting edema to the hips bilaterally. BUE edema, L>R.  Pulm: Normal WOB on RA, speaking in full sentences. O2 sats in low-mid 90s on monitor throughout interview/exam Abd: Seems to have some abdominal wall edema, no underlying fluid wave  A&P AKI on CKD IV Cr continues slow uptrend. 3.79>3.98. She is obviously disappointed by the lack of  progress. Remains without an indication for dialysis. Recommend ongoing supportive care measures and serial monitoring of renal function. Not sure Lasix is helping at this point and may be contributing to rise in Cr, so will hold for today. - Hold Lasix today - Repeat labs tomorrow am - Strict I/O - Avoid nephrotoxic meds  2.  Acute Blood Loss Anemia No evidence of ongoing blood loss - Per primary  3.  HTN Normotensive this morning.  - Continue current management with amlodipine and Coreg per primary  [name] MD 07/19/2022, 10:33 AM  Recent Labs  Lab 07/17/22 0300 07/18/22 0337 07/19/22 0237  NA 136 137 137  K 3.8 4.2 3.3*  CL 103 103 103  CO2 22 21* 23  GLUCOSE 114* 93 97  BUN 47* 54* 56*  CREATININE 3.61* 3.79* 3.98*  CALCIUM 9.4 9.3 9.3   Recent Labs  Lab 07/14/22 0747 07/17/22 0300 07/18/22 0337  WBC 14.7* 11.8* 11.6*  NEUTROABS 10.4* 6.4 6.5  HGB 8.1* 7.3* 7.1*  HCT 24.9* 22.2* 21.4*  MCV 95.4 93.7 93.0  PLT 375 354 327

## 2022-07-19 NOTE — Plan of Care (Signed)

## 2022-07-20 ENCOUNTER — Ambulatory Visit: Payer: Medicare PPO | Admitting: Orthopaedic Surgery

## 2022-07-20 DIAGNOSIS — N10 Acute tubulo-interstitial nephritis: Secondary | ICD-10-CM | POA: Diagnosis not present

## 2022-07-20 LAB — CBC WITH DIFFERENTIAL/PLATELET
Abs Immature Granulocytes: 0.05 10*3/uL (ref 0.00–0.07)
Basophils Absolute: 0.2 10*3/uL — ABNORMAL HIGH (ref 0.0–0.1)
Basophils Relative: 2 %
Eosinophils Absolute: 0.4 10*3/uL (ref 0.0–0.5)
Eosinophils Relative: 3 %
HCT: 22.4 % — ABNORMAL LOW (ref 36.0–46.0)
Hemoglobin: 7.6 g/dL — ABNORMAL LOW (ref 12.0–15.0)
Immature Granulocytes: 0 %
Lymphocytes Relative: 19 %
Lymphs Abs: 2.1 10*3/uL (ref 0.7–4.0)
MCH: 31.3 pg (ref 26.0–34.0)
MCHC: 33.9 g/dL (ref 30.0–36.0)
MCV: 92.2 fL (ref 80.0–100.0)
Monocytes Absolute: 1.8 10*3/uL — ABNORMAL HIGH (ref 0.1–1.0)
Monocytes Relative: 16 %
Neutro Abs: 6.7 10*3/uL (ref 1.7–7.7)
Neutrophils Relative %: 60 %
Platelets: 272 10*3/uL (ref 150–400)
RBC: 2.43 MIL/uL — ABNORMAL LOW (ref 3.87–5.11)
RDW: 17.9 % — ABNORMAL HIGH (ref 11.5–15.5)
WBC: 11.1 10*3/uL — ABNORMAL HIGH (ref 4.0–10.5)
nRBC: 0.4 % — ABNORMAL HIGH (ref 0.0–0.2)

## 2022-07-20 LAB — BASIC METABOLIC PANEL
Anion gap: 10 (ref 5–15)
BUN: 61 mg/dL — ABNORMAL HIGH (ref 8–23)
CO2: 22 mmol/L (ref 22–32)
Calcium: 8.9 mg/dL (ref 8.9–10.3)
Chloride: 103 mmol/L (ref 98–111)
Creatinine, Ser: 4.03 mg/dL — ABNORMAL HIGH (ref 0.44–1.00)
GFR, Estimated: 10 mL/min — ABNORMAL LOW (ref >60)
Glucose, Bld: 100 mg/dL — ABNORMAL HIGH (ref 70–99)
Potassium: 3.2 mmol/L — ABNORMAL LOW (ref 3.5–5.1)
Sodium: 135 mmol/L (ref 135–145)

## 2022-07-20 LAB — TYPE AND SCREEN
ABO/RH(D): O POS
Antibody Screen: NEGATIVE
Unit division: 0

## 2022-07-20 LAB — BPAM RBC
Blood Product Expiration Date: 202404132359
ISSUE DATE / TIME: 202403201655
Unit Type and Rh: 5100

## 2022-07-20 LAB — ALBUMIN: Albumin: 2.8 g/dL — ABNORMAL LOW (ref 3.5–5.0)

## 2022-07-20 MED ORDER — AMLODIPINE BESYLATE 5 MG PO TABS: 5.0000 mg | ORAL_TABLET | Freq: Every day | ORAL | Status: DC

## 2022-07-20 MED ORDER — POTASSIUM CHLORIDE CRYS ER 20 MEQ PO TBCR
40.0000 meq | EXTENDED_RELEASE_TABLET | Freq: Once | ORAL | Status: AC
  Administered 2022-07-20: 40 meq via ORAL
  Filled 2022-07-20: qty 2

## 2022-07-20 NOTE — Progress Notes (Signed)
Physical Therapy Treatment Patient Details Name: [name] MRN: [id number] DOB: 06/02/1940 Today's Date: 07/20/2022   History of Present Illness 82 y.o. female presents to [facility] hospital on 07/02/2022 with worsening weakness. Pt admitted for management of acute cystitis. PMH includes CKD IV, HTN, RA, hypothyroidism, CABG, COPD, HLD, PE.    PT Comments    Pt greeted supine in bed, sleeping and needing increased time to wake, with alertness improving slighting once sitting up EOB for increased time, and significantly improved post ambulation. Pt needing min A to come to sit EOB with poor initiation due to lethargy. Pt able to power up to stand with min A to RW and demonstrate hallway gait with RW support and min guard for safety. SpO2 remaining 89% and above on RA throughout activity. Pt was educated on continued walker use to maximize functional independence, safety, and decrease risk for falls and continued and frequent IS use. Pt agreeable to time up in chair at end of session. Current plan remains appropriate to address deficits and maximize functional independence and safety. Pt continues to benefit from skilled PT services to progress toward functional mobility goals.     Recommendations for follow up therapy are one component of a multi-disciplinary discharge planning process, led by the attending physician.  Recommendations may be updated based on patient status, additional functional criteria and insurance authorization.  Follow Up Recommendations  Home health PT Can patient physically be transported by private vehicle: Yes   Assistance Recommended at Discharge Intermittent Supervision/Assistance  Patient can return home with the following Help with stairs or ramp for entrance;Assist for transportation;Assistance with cooking/housework   Equipment Recommendations  None recommended by PT    Recommendations for Other Services       Precautions / Restrictions Precautions Precautions:  Fall;Other (comment) Precaution Comments: watch 02/RR Restrictions Weight Bearing Restrictions: No     Mobility  Bed Mobility Overal bed mobility: Needs Assistance Bed Mobility: Supine to Sit     Supine to sit: HOB elevated, Min assist     General bed mobility comments: use of rail and increased time, min A to manage LEs and trunk secondary to increased fatigue/lethargy    Transfers Overall transfer level: Needs assistance Equipment used: Rolling walker (2 wheels) Transfers: Sit to/from Stand Sit to Stand: Min assist           General transfer comment: light min A to power up to RW, good hand placement    Ambulation/Gait Ambulation/Gait assistance: Min guard Gait Distance (Feet): 184 Feet Assistive device: Rolling walker (2 wheels) Gait Pattern/deviations: Step-through pattern, Decreased stride length, Trunk flexed Gait velocity: decr     General Gait Details: cues for upright posture and proximity to RW. distance limited due to fatigue, SPO2 89% and greater on RA   Stairs             Wheelchair Mobility    Modified Rankin (Stroke Patients Only)       Balance Overall balance assessment: Needs assistance Sitting-balance support: Feet supported, No upper extremity supported Sitting balance-Leahy Scale: Good Sitting balance - Comments: stable sitting balance but noted R lateral lean Postural control: Right lateral lean Standing balance support: During functional activity Standing balance-Leahy Scale: Fair Standing balance comment: reliant on UE support, some R lateral trunk flexion in standing                            Cognition Arousal/Alertness: Awake/alert, Lethargic (improving with  ambulation and time up OOB) Behavior During Therapy: [facility] for tasks assessed/performed Overall Cognitive Status: Impaired/Different from baseline Area of Impairment: Following commands, Awareness, Problem solving                   Current  Attention Level: Alternating, Selective   Following Commands: Follows one step commands consistently     Problem Solving: Slow processing, Decreased initiation General Comments: lethargic initially but became more alert once OOB        Exercises Other Exercises Other Exercises: warmup LE exercises, seated marching x20, LAQ x10 ea side    General Comments        Pertinent Vitals/Pain Pain Assessment Pain Assessment: Faces Faces Pain Scale: Hurts a little bit Pain Location: generalized Pain Descriptors / Indicators: Guarding Pain Intervention(s): Monitored during session, Limited activity within patient's tolerance    Home Living                          Prior Function            PT Goals (current goals can now be found in the care plan section) Acute Rehab PT Goals Patient Stated Goal: to go home PT Goal Formulation: With patient Time For Goal Achievement: 07/31/22 Progress towards PT goals: Progressing toward goals    Frequency    Min 3X/week      PT Plan Current plan remains appropriate    Co-evaluation              AM-PAC PT "6 Clicks" Mobility   Outcome Measure  Help needed turning from your back to your side while in a flat bed without using bedrails?: A Little Help needed moving from lying on your back to sitting on the side of a flat bed without using bedrails?: A Little Help needed moving to and from a bed to a chair (including a wheelchair)?: A Little Help needed standing up from a chair using your arms (e.g., wheelchair or bedside chair)?: A Little Help needed to walk in hospital room?: A Little Help needed climbing 3-5 steps with a railing? : A Little 6 Click Score: 18    End of Session Equipment Utilized During Treatment: Gait belt Activity Tolerance: Patient limited by lethargy;Patient limited by fatigue Patient left: in chair;with call bell/phone within reach;with family/visitor present Nurse Communication: Mobility  status PT Visit Diagnosis: Other abnormalities of gait and mobility (R26.89);Muscle weakness (generalized) (M62.81);Repeated falls (R29.6)     Time: [id number] PT Time Calculation (min) (ACUTE ONLY): 31 min  Charges:  $Gait Training: 8-22 mins $Therapeutic Activity: 8-22 mins                     Frazer Rainville R. PTA Acute Rehabilitation Services Office: [place] 07/20/2022, 10:31 AM

## 2022-07-20 NOTE — Plan of Care (Signed)

## 2022-07-20 NOTE — Progress Notes (Signed)
Admit: 07/02/2022 LOS: 7   [name] is an 82yo female with history of CKD IV who follows with Dr. Royce Macadamia, HTN, RA, hypothyroidism, CAD s/p CABG, COPD, HLD, DVT/PE who presented with weakness and fever, found to have pyelonephritis and urosepsis. Hospital course complicated by acute blood loss anemia 2/2 gastric/duodenal ulcers. Nephrology consulted due to AKI on CKD IV.   Subjective:  Unfortunately a fair bit more sleepy/groggy this morning. Falls asleep intermittently during exam but is oriented x4 on questioning. Attributes sleepiness to poor sleep in the hospital. Getting Seroquel for sleep for the past several days. Note last night's dose was given about 2.5 hours later than previous nights. She is pleased to share that her upper extremity swelling is much improved this morning. Unclear how much urine she put out last night. She tells me it was a normal amount but only ~546mL + 1 unmeasured UOP documented.  03/20 0701 - 03/21 0700 In: 623.3 [P.O.:240; Blood:383.3] Out: -   Filed Weights   07/15/22 0500 07/16/22 0500 07/18/22 0428  Weight: 75.9 kg 75.5 kg 75.8 kg    Scheduled Meds:  acidophilus  1 capsule Oral TID   [START ON 07/21/2022] amLODipine  5 mg Oral Daily   atorvastatin  80 mg Oral Daily   carvedilol  6.25 mg Oral BID WC   levothyroxine  88 mcg Oral QAC breakfast   pantoprazole  40 mg Oral BID   QUEtiapine  25 mg Oral Q24H   triamcinolone  1 spray Each Nare Daily   Continuous Infusions: PRN Meds:.acetaminophen **OR** acetaminophen, hydrALAZINE, hydrALAZINE, ondansetron **OR** ondansetron (ZOFRAN) IV, mouth rinse, polyethylene glycol, witch hazel-glycerin  Current Labs: reviewed    Physical Exam:  Blood pressure (!) 133/54, pulse 77, temperature 98 F (36.7 C), temperature source Oral, resp. rate 15, height 5\' 3"  (1.6 m), weight 75.8 kg, SpO2 92 %. Gen: Elderly female lying in bed, quite drowsy, falling asleep intermittently during exam but wakes to voice Cardio:  RRR, no murmur, UE edema much improved compared to my previous exams, LE remain with taught edema to the hips bilaterally Abd: Abdominal wall edema improved compared to previous exams   A&P AKI on CKD IV Cr essentially unchanged 3.98>4.03. While I'm encouraged by the reported (though unmeasured) increase in UOP and the obvious improvement in edema, most evident in the upper extremities, I am concerned by the degree of drowsiness on exam this morning. Had been hopeful for possible discharge with close outpatient follow-up for AKI care, but drowsiness warrants ongoing hospital observation. - Continue holding Lasix, edema is improving, presume 2/2 recovering renal function despite this not yet being reflected in her labs - Strict I/O, daily weights - Avoid nephrotoxic meds - Trend daily BMP  2.  Acute Blood Loss Anemia No evidence of ongoing blood loss - Per primary   3.  HTN Normotensive this morning.  - Continue current management with amlodipine and Coreg per primary    [name] MD 07/20/2022, 11:26 AM  Recent Labs  Lab 07/18/22 0337 07/19/22 0237 07/20/22 0423  NA 137 137 135  K 4.2 3.3* 3.2*  CL 103 103 103  CO2 21* 23 22  GLUCOSE 93 97 100*  BUN 54* 56* 61*  CREATININE 3.79* 3.98* 4.03*  CALCIUM 9.3 9.3 8.9   Recent Labs  Lab 07/18/22 0337 07/19/22 0237 07/19/22 2134 07/20/22 0423  WBC 11.6* 10.8*  --  11.1*  NEUTROABS 6.5 6.0  --  6.7  HGB 7.1* 7.0* 7.5* 7.6*  HCT 21.4* 20.5* 22.0* 22.4*  MCV 93.0 92.8  --  92.2  PLT 327 285  --  272

## 2022-07-20 NOTE — TOC Progression Note (Signed)
Transition of Care [facility]) - Progression Note    Patient Details  Name: [name] MRN: [id number] Date of Birth: [birth year]  [facility] of [facility] CM/SW [place], LCSW Phone Number: 07/20/2022, 12:18 PM  Clinical Narrative:    CSW spoke with [name] and made her aware patient is not ready for discharge yet.    Expected Discharge Plan: [employer] Barriers to Discharge: Continued Medical Work up  Expected Discharge Plan and Services In-house Referral: Clinical Social Work     Living arrangements for the past 2 months: [place]                                       Social Determinants of Health (SDOH) Interventions SDOH Screenings   Food Insecurity: No Food Insecurity (06/13/2022)  Housing: Low Risk  (06/13/2022)  Transportation Needs: No Transportation Needs (06/13/2022)  Utilities: Not At Risk (06/13/2022)  Alcohol Screen: Low Risk  (05/30/2021)  Depression (PHQ2-9): Low Risk  (06/15/2022)  Financial Resource Strain: Low Risk  (06/13/2022)  Physical Activity: Inactive (06/13/2022)  Social Connections: Unknown (06/13/2022)  Stress: No Stress Concern Present (06/13/2022)  Tobacco Use: Medium Risk (07/11/2022)    Readmission Risk Interventions    07/05/2022    9:42 AM  Readmission Risk Prevention Plan  Transportation Screening Complete  Medication Review (RN Care Manager) Complete  PCP or Specialist appointment within 3-5 days of discharge Complete  HRI or [place] Complete  SW Recovery Care/Counseling Consult Complete  Palliative Care Screening Not Applicable  Skilled Nursing Facility Complete

## 2022-07-20 NOTE — Progress Notes (Signed)
PROGRESS NOTE    [name]  [id number] DOB: 01-Nov-1940 DOA: 07/02/2022 PCP: Cassandria Anger, MD   Brief Narrative:  [name] is a 82 y.o. female with a history of CKD stage IV, hypertension, rheumatoid arthritis, hypothyroidism, CAD s/p CABG, COPD, hyperlipidemia, DVT/PE. Patient presented secondary to weakness and fever and is found to have evidence of sepsis with a likely urinary source. Empiric antibiotics started and cultures obtained. Patient also found to have hyponatremia with associated dehydration and AKI. IV fluids started. Workup significant for acute polynephritis/bacteremia due to E. coli for which she completed antibiotics, as well she did develop melena secondary to gastric/duodenal ulcers.  Eliquis discontinued, then resumed and then discontinued, details below.  Currently has been having issues with volume overload and rising creatinine and nephrology managing that.  Assessment & Plan:   Principal Problem:   Acute pyelonephritis Active Problems:   Acute renal failure superimposed on stage 4 chronic kidney disease (HCC)   Acute metabolic encephalopathy   Hyponatremia   Sepsis with acute renal failure (HCC)   Acquired hypothyroidism   Essential hypertension   Rheumatoid arthritis (HCC)   Anticoagulated   Hx of CABG   Hyperlipidemia   COPD (chronic obstructive pulmonary disease) (HCC)   CKD (chronic kidney disease) stage 4, GFR 15-29 ml/min (HCC) - baseline SCr 2.5. follows with Dr. Royce Macadamia with [facility]   History of pulmonary embolism   Upper GI bleed   Melena   Metabolic acidosis, increased anion gap   Generalized weakness   DNR (do not resuscitate)/DNI(Do Not Intubate)   E coli bacteremia   Dark stools   Anemia   Acute gastric ulcer with hemorrhage   Duodenal ulcer   Sepsis due to acute hemorrhagic cystitis /right pyelonephritis with E. coli bacteremia Sepsis present on admission - Patient with non-specific symptoms but with  associated fever and leukocytosis.  She was noted to have significant right flank pain/CVA tenderness on exam . Urinalysis suggests infection, confirmed on urine culture. Patient started empirically on Ceftriaxone IV. Associated E. Coli bacteremia -on IV  Ceftriaxone 2 g IV, narrowed to IV cefazolin, then on Unasyn.  She completed total of 10 days of antibiotics   Acute blood loss anemia Acute upper GI bleed due to gastric ulcer/duodenal ulcers -Developed melena 3/8, Eliquis was held, GI input greatly appreciated, underwent EGD 07/08/2022, significant for gastric and duodenal ulcer, no evidence of active bleed, recommendation to hold anticoagulation for few days and for Protonix twice daily for 6 weeks and then once daily.  Eliquis resumed on 07/14/2022.  Hemoglobin continued to drop, Eliquis was held for 3 doses until the night of 07/19/2022, hemoglobin eventually dropped to 7.0 on 07/19/2022, 1 unit of PRBC transfusion was given.  Holding Eliquis again.  No obvious source of bleeding.  No reports of hematochezia or melena.  Posttransfusion hemoglobin 7.5 yesterday.  Repeat CBC today.   Hyponatremia Sodium of 124 on admission. Likely related to dehydration and hypovolemia, present on admission.  -Resolved with IV hydration.   Acute metabolic encephalopathy  Likely related to overall illness but more specifically dehydration and hyponatremia. Patient is from independent living.  She is now alert and oriented.   Acute renal failure superimposed on stage 4 chronic kidney disease (HCC) Metabolic acidosis, increased anion gap/fluid overload - baseline SCr 2.5. follows with Dr. Royce Macadamia with [facility] Creatinine of 4.70 on admission secondary to poor oral intake in addition to patient continuing diuretic use.  Creatinine improved to 2.8 but  then has been creeping up for last few days and improved somewhat today and currently 3.57.  Bicarb 23 today..  Continue bicarb tablets.  Renal ultrasound negative for  obstruction.  Creatinine improved and plateaued around 3.3 range for few days and then started creeping up, was given diuretic holiday on 07/19/2022 and currently creatinine 4.03.  Nephrology now managing.   Essential hypertension Patient blood pressure is now fairly controlled on increased dose of Coreg and home dose of amlodipine.  Nephrology has reduced amlodipine dose to 5 mg today   Hypokalemia Low again.  Will replace.   History of pulmonary embolism//long-term use of anticoagulants atient is on Eliquis as an outpatient for the indication of PE/DVT which does not qualify for dose reduction, however patient is on reduced dose of 2.5 mg BID as an outpatient; possibly related to history of GI bleeding.  Eliquis was initially held due to GI bleeding but eventually resumed on 07/14/2022.  Will hold Eliquis again.  Last dose was last night.  We held 3 doses prior to that.   COPD (chronic obstructive pulmonary disease) (HCC) Stable. Asymptomatic.   Hyperlipidemia -Continue Lipitor daily   Hx of CABG Noted. No chest pain.   Rheumatoid arthritis ([place]) Patient is on [place] as an outpatient which is held secondary to active infection.   Acquired hypothyroidism -Continue Synthroid 88 mcg daily   Generalized weakness Secondary to acute illness. PT/OT recommending SNF.  Plan to discharge to SNF once medically stable.   Acute hypoxic respiratory failure Pneumonia Patient at times is requiring oxygen, especially when she is laying flat.  She is requiring 1 to 2 L of oxygen.  However she denies any shortness of breath when sitting up.  She has diminished breath sounds at bases, she has been advised multiple times and encouraged to use incentive spirometry but she is not following recommendations, she tends to forget.  Repeat chest x-ray on 07/15/2022 shows atelectasis as well and procalcitonin unremarkable indicates likely atelectasis clinically as well.  Insomnia/lethargy: Patient was noted to be  lethargic early in the morning, when asked, she told us the reason that she was not getting good sleep.  For that reason, I started her on Seroquel.  She was doing good for the last couple of days but this morning apparently again she was lethargic.  I noticed that she was given her Seroquel at 10:30 PM instead of 8 PM as I had ordered.  Disposition: Patient fluid overloaded, requires more IV diuresis.  Not medically stable for discharge yet.  TOC aware.  DVT prophylaxis: Place TED hose Start: 07/15/22 0954 SCDs Start: 07/03/22 0431   Code Status: DNR  Family Communication: Daughter present at bedside.  Plan of care discussed with patient in length and he/she verbalized understanding and agreed with it.  Status is: Inpatient Remains inpatient appropriate because: Creatinine rising and has fluid overload.   Estimated body mass index is 29.6 kg/m as calculated from the following:   Height as of this encounter: 5\' 3"  (1.6 m).   Weight as of this encounter: 75.8 kg.    Nutritional Assessment: Body mass index is 29.6 kg/m.[name] Seen by dietician.  I agree with the assessment and plan as outlined below: Nutrition Status:        . Skin Assessment: I have examined the patient's skin and I agree with the wound assessment as performed by the wound care RN as outlined below:    Consultants:  GI  Procedures:  As above  Antimicrobials:  Anti-infectives (From admission, onward)    Start     Dose/Rate Route Frequency Ordered Stop   07/08/22 1245  ampicillin-sulbactam (UNASYN) 1.5 g in sodium chloride 0.9 % 100 mL IVPB        1.5 g 200 mL/hr over 30 Minutes Intravenous Every 12 hours 07/08/22 1154 07/13/22 1030   07/05/22 2000  ceFAZolin (ANCEF) IVPB 2g/100 mL premix  Status:  Discontinued        2 g 200 mL/hr over 30 Minutes Intravenous Every 12 hours 07/05/22 0958 07/05/22 1004   07/05/22 1530  ceFAZolin (ANCEF) IVPB 2g/100 mL premix  Status:  Discontinued        2 g 200 mL/hr over  30 Minutes Intravenous Every 12 hours 07/05/22 1004 07/08/22 1145   07/04/22 0000  cefTRIAXone (ROCEPHIN) 1 g in sodium chloride 0.9 % 100 mL IVPB  Status:  Discontinued        1 g 200 mL/hr over 30 Minutes Intravenous Every 24 hours 07/03/22 0211 07/03/22 1153   07/03/22 1600  cefTRIAXone (ROCEPHIN) 2 g in sodium chloride 0.9 % 100 mL IVPB  Status:  Discontinued        2 g 200 mL/hr over 30 Minutes Intravenous Every 24 hours 07/03/22 1153 07/05/22 0958   07/03/22 0215  cefTRIAXone (ROCEPHIN) 1 g in sodium chloride 0.9 % 100 mL IVPB        1 g 200 mL/hr over 30 Minutes Intravenous  Once 07/03/22 0212 07/03/22 0243   07/03/22 0145  vancomycin (VANCOREADY) IVPB 1250 mg/250 mL  Status:  Discontinued        1,250 mg 166.7 mL/hr over 90 Minutes Intravenous  Once 07/03/22 0132 07/03/22 0210   07/03/22 0130  ceFEPIme (MAXIPIME) 2 g in sodium chloride 0.9 % 100 mL IVPB  Status:  Discontinued        2 g 200 mL/hr over 30 Minutes Intravenous  Once 07/03/22 0124 07/03/22 0210   07/03/22 0130  metroNIDAZOLE (FLAGYL) IVPB 500 mg  Status:  Discontinued        500 mg 100 mL/hr over 60 Minutes Intravenous  Once 07/03/22 0124 07/03/22 0210   07/03/22 0130  vancomycin (VANCOCIN) IVPB 1000 mg/200 mL premix  Status:  Discontinued        1,000 mg 200 mL/hr over 60 Minutes Intravenous  Once 07/03/22 0124 07/03/22 0130         Subjective:  Patient seen and examined.  Daughter at the bedside.  Patient complains of weakness.  She does appear to be very weak today as well.  Yet again, she is not using incentive spirometry as recommended.  Denies shortness of breath though.  Objective: Vitals:   07/19/22 1200 07/19/22 1643 07/19/22 1718 07/19/22 2000  BP: 106/68 (!) 111/96 112/74 (!) 124/47  Pulse: 81 83 80 82  Resp: 18 16 20 15   Temp: 97.6 F (36.4 C) 98.1 F (36.7 C) 98.3 F (36.8 C) 98 F (36.7 C)  TempSrc: Oral Oral Oral Oral  SpO2: 92% 95% 97% 92%  Weight:      Height:        Intake/Output  Summary (Last 24 hours) at 07/20/2022 0813 Last data filed at 07/20/2022 0747 Gross per 24 hour  Intake 623.34 ml  Output 500 ml  Net 123.34 ml    Filed Weights   07/15/22 0500 07/16/22 0500 07/18/22 0428  Weight: 75.9 kg 75.5 kg 75.8 kg    Examination:  General exam: Appears calm and comfortable  Respiratory  system: Diminished breath sounds with Rales at bases, consistent with atelectasis.  Respiratory effort normal. Cardiovascular system: S1 & S2 heard, RRR. No JVD, murmurs, rubs, gallops or clicks.  +3 pitting edema bilateral lower extremity and +2 pitting edema bilateral lower extremity. Gastrointestinal system: Abdomen is nondistended, soft and nontender. No organomegaly or masses felt. Normal bowel sounds heard. Central nervous system: Alert and oriented. No focal neurological deficits. Extremities: Symmetric 5 x 5 power. Skin: No rashes, lesions or ulcers.  Psychiatry: Judgement and insight appear normal. Mood & affect appropriate.   Data Reviewed: I have personally reviewed following labs and imaging studies  CBC: Recent Labs  Lab 07/14/22 0747 07/17/22 0300 07/18/22 0337 07/19/22 0237 07/19/22 2134  WBC 14.7* 11.8* 11.6* 10.8*  --   NEUTROABS 10.4* 6.4 6.5 6.0  --   HGB 8.1* 7.3* 7.1* 7.0* 7.5*  HCT 24.9* 22.2* 21.4* 20.5* 22.0*  MCV 95.4 93.7 93.0 92.8  --   PLT 375 354 327 285  --     Basic Metabolic Panel: Recent Labs  Lab 07/16/22 0759 07/17/22 0300 07/18/22 0337 07/19/22 0237 07/20/22 0423  NA 142 136 137 137 135  K 3.9 3.8 4.2 3.3* 3.2*  CL 106 103 103 103 103  CO2 22 22 21* 23 22  GLUCOSE 108* 114* 93 97 100*  BUN 44* 47* 54* 56* 61*  CREATININE 3.40* 3.61* 3.79* 3.98* 4.03*  CALCIUM 9.7 9.4 9.3 9.3 8.9    GFR: Estimated Creatinine Clearance: 10.1 mL/min (A) (by C-G formula based on SCr of 4.03 mg/dL (H)). Liver Function Tests: Recent Labs  Lab 07/14/22 0747 07/19/22 0237 07/20/22 0423  AST 13*  --   --   ALT 5  --   --   ALKPHOS 88   --   --   BILITOT 0.9  --   --   PROT 6.3*  --   --   ALBUMIN 3.4* 2.8* 2.8*    No results for input(s): "LIPASE", "AMYLASE" in the last 168 hours. No results for input(s): "AMMONIA" in the last 168 hours. Coagulation Profile: No results for input(s): "INR", "PROTIME" in the last 168 hours. Cardiac Enzymes: No results for input(s): "CKTOTAL", "CKMB", "CKMBINDEX", "TROPONINI" in the last 168 hours. BNP (last 3 results) No results for input(s): "PROBNP" in the last 8760 hours. HbA1C: No results for input(s): "HGBA1C" in the last 72 hours. CBG: No results for input(s): "GLUCAP" in the last 168 hours. Lipid Profile: No results for input(s): "CHOL", "HDL", "LDLCALC", "TRIG", "CHOLHDL", "LDLDIRECT" in the last 72 hours. Thyroid Function Tests: No results for input(s): "TSH", "T4TOTAL", "FREET4", "T3FREE", "THYROIDAB" in the last 72 hours. Anemia Panel: No results for input(s): "VITAMINB12", "FOLATE", "FERRITIN", "TIBC", "IRON", "RETICCTPCT" in the last 72 hours. Sepsis Labs: Recent Labs  Lab 07/16/22 0759  PROCALCITON 0.18     No results found for this or any previous visit (from the past 240 hour(s)).    Radiology Studies: No results found.  Scheduled Meds:  acidophilus  1 capsule Oral TID   amLODipine  10 mg Oral Daily   atorvastatin  80 mg Oral Daily   carvedilol  6.25 mg Oral BID WC   levothyroxine  88 mcg Oral QAC breakfast   pantoprazole  40 mg Oral BID   potassium chloride  40 mEq Oral Once   QUEtiapine  25 mg Oral Q24H   triamcinolone  1 spray Each Nare Daily   Continuous Infusions:     LOS: 17 days   Alejos Reinhardt  Salim Forero, MD [facility]  07/20/2022, 8:13 AM   *Please note that this is a verbal dictation therefore any spelling or grammatical errors are due to the "[place]" system interpretation.  Please page via [place] and do not message via secure chat for urgent patient care matters. Secure chat can be used for non urgent patient care  matters.  How to contact the [facility] Attending or Consulting provider [place] or covering provider during after hours [place], for this patient?  Check the care team in [facility] and look for a) attending/consulting TR[facility] provider listed and b) the [facility] team listed. Page or secure chat [phone]. Log into [email or web address] and use [place]'s universal password to access. If you do not have the password, please contact the hospital operator. Locate the [facility] provider you are looking for under [facility] and page to a number that you can be directly reached. If you still have difficulty reaching the provider, please page the [facility] (Director on Call) for the Hospitalists listed on amion for assistance.

## 2022-07-20 NOTE — Progress Notes (Signed)
Mobility Specialist Progress Note   07/20/22 1355  Mobility  Activity Ambulated with assistance in hallway  Level of Assistance Standby assist, set-up cues, supervision of patient - no hands on  Assistive Device Front wheel walker  Distance Ambulated (ft) 304 ft (152 + 152)  Activity Response Tolerated well  Mobility Referral Yes  $Mobility charge 1 Mobility   Received pt in hallway ambulating w/ daughter. X1 seated rest break d/t fatigue but pt able to recover w/ PLB. Ambulated back to room w/ standby assist and min cues on posture. Left in chair w/ call bell in room and family present.   Holland Falling Mobility Specialist Please contact via SecureChat or  Rehab office at [phone]

## 2022-07-21 DIAGNOSIS — N10 Acute tubulo-interstitial nephritis: Secondary | ICD-10-CM | POA: Diagnosis not present

## 2022-07-21 LAB — BASIC METABOLIC PANEL
Anion gap: 13 (ref 5–15)
BUN: 60 mg/dL — ABNORMAL HIGH (ref 8–23)
CO2: 22 mmol/L (ref 22–32)
Calcium: 9.1 mg/dL (ref 8.9–10.3)
Chloride: 102 mmol/L (ref 98–111)
Creatinine, Ser: 4.17 mg/dL — ABNORMAL HIGH (ref 0.44–1.00)
GFR, Estimated: 10 mL/min — ABNORMAL LOW (ref >60)
Glucose, Bld: 104 mg/dL — ABNORMAL HIGH (ref 70–99)
Potassium: 3.8 mmol/L (ref 3.5–5.1)
Sodium: 137 mmol/L (ref 135–145)

## 2022-07-21 LAB — ALBUMIN: Albumin: 2.8 g/dL — ABNORMAL LOW (ref 3.5–5.0)

## 2022-07-21 NOTE — Progress Notes (Signed)
Occupational Therapy Treatment Patient Details Name: [name] MRN: [id number] DOB: 18-May-1940 Today's Date: 07/21/2022   History of present illness 82 y.o. female presents to [facility] hospital on 07/02/2022 with worsening weakness. Pt admitted for management of acute cystitis. PMH includes CKD IV, HTN, RA, hypothyroidism, CABG, COPD, HLD, PE.   OT comments  Pt progressing slowly towards established OT goals. Pt reporting that she does well in movements that she is not fatigued, but needs significant assist when fatigued. Pt and family have elected for pt to go to SNF for continued rehabilitation due to lack of full time family support from fully able bodied individuals at home. Pt performing LB Adl with mod A and ambulation with supervision this session. Updated goals. Recommending SNF to optimize safety and independence as well as decrease caregiver burden at home.    Recommendations for follow up therapy are one component of a multi-disciplinary discharge planning process, led by the attending physician.  Recommendations may be updated based on patient status, additional functional criteria and insurance authorization.    Follow Up Recommendations  Skilled nursing-short term rehab (<3 hours/day)     Assistance Recommended at Discharge Intermittent Supervision/Assistance  Patient can return home with the following  A little help with walking and/or transfers;A little help with bathing/dressing/bathroom;Assistance with cooking/housework;Assist for transportation;Help with stairs or ramp for entrance   Equipment Recommendations  None recommended by OT    Recommendations for Other Services      Precautions / Restrictions Precautions Precautions: Fall;Other (comment) Precaution Comments: watch 02/RR Restrictions Weight Bearing Restrictions: No       Mobility Bed Mobility               General bed mobility comments: EOB on arrival and in recliner on departure     Transfers Overall transfer level: Needs assistance Equipment used: Rolling walker (2 wheels) Transfers: Sit to/from Stand Sit to Stand: Min assist           General transfer comment: light min A to power up     Balance Overall balance assessment: Needs assistance Sitting-balance support: Feet supported, No upper extremity supported Sitting balance-Leahy Scale: Good     Standing balance support: During functional activity Standing balance-Leahy Scale: Fair Standing balance comment: reliant on UE support, some R lateral trunk flexion in standing                           ADL either performed or assessed with clinical judgement   ADL Overall ADL's : Needs assistance/impaired     Grooming: Wash/dry face;Standing;Min guard               Lower Body Dressing: Moderate assistance Lower Body Dressing Details (indicate cue type and reason): for donning socks sititng on chair             Functional mobility during ADLs: Supervision/safety;Rolling walker (2 wheels) General ADL Comments: Continues with decreased awareness of activity tolerance.    Extremity/Trunk Assessment Upper Extremity Assessment Upper Extremity Assessment: Generalized weakness   Lower Extremity Assessment Lower Extremity Assessment: Defer to PT evaluation        Vision   Additional Comments: Performing VOR horizontally and vertically. Improved with slowed speed of tracking   Perception     Praxis      Cognition Arousal/Alertness: Awake/alert Behavior During Therapy: WFL for tasks assessed/performed Overall Cognitive Status: Impaired/Different from baseline Area of Impairment: Following commands, Awareness, Problem solving  Current Attention Level: Alternating, Selective   Following Commands: Follows one step commands consistently     Problem Solving: Slow processing, Decreased initiation General Comments: slow processing throughout. More flat  in affect today with intermittent expression of emotion        Exercises Other Exercises Other Exercises: VOR with visual tracking/conversion    Shoulder Instructions       General Comments      Pertinent Vitals/ Pain       Pain Assessment Pain Assessment: Faces Faces Pain Scale: Hurts a little bit Pain Location: generalized Pain Descriptors / Indicators: Guarding Pain Intervention(s): Limited activity within patient's tolerance, Monitored during session  Home Living                                          Prior Functioning/Environment              Frequency  Min 2X/week        Progress Toward Goals  OT Goals(current goals can now be found in the care plan section)  Progress towards OT goals: Progressing toward goals  Acute Rehab OT Goals Patient Stated Goal: get better OT Goal Formulation: With patient Time For Goal Achievement: 08/01/22 Potential to Achieve Goals: Good  Plan Discharge plan needs to be updated;Frequency remains appropriate    Co-evaluation                 AM-PAC OT "6 Clicks" Daily Activity     Outcome Measure   Help from another person eating meals?: None Help from another person taking care of personal grooming?: A Little Help from another person toileting, which includes using toliet, bedpan, or urinal?: A Little Help from another person bathing (including washing, rinsing, drying)?: A Little Help from another person to put on and taking off regular upper body clothing?: A Little Help from another person to put on and taking off regular lower body clothing?: A Little 6 Click Score: 19    End of Session Equipment Utilized During Treatment: Gait belt;Rolling walker (2 wheels)  OT Visit Diagnosis: Unsteadiness on feet (R26.81);Muscle weakness (generalized) (M62.81);Other abnormalities of gait and mobility (R26.89)   Activity Tolerance Patient tolerated treatment well   Patient Left in chair;with call  bell/phone within reach;with chair alarm set;with family/visitor present   Nurse Communication Mobility status        Time: 1538-1610 OT Time Calculation (min): 32 min  Charges: OT General Charges $OT Visit: 1 Visit OT Treatments $Self Care/Home Management : 23-37 mins  Elder Cyphers, OTR/L [facility] Acute Rehabilitation Office: [phone]   Magnus Ivan 07/21/2022, 5:08 PM

## 2022-07-21 NOTE — Progress Notes (Signed)
Admit: 07/02/2022 LOS: 58  [name] is an 82yo female with history of CKD IV who follows with Dr. Royce Macadamia, HTN, RA, hypothyroidism, CAD s/p CABG, COPD, HLD, DVT/PE who presented with weakness and fever, found to have pyelonephritis and urosepsis. Hospital course complicated by acute blood loss anemia 2/2 gastric/duodenal ulcers. Nephrology consulted due to AKI on CKD IV.   Subjective:  Had a hard time getting up this morning--tired. But feeling better now that she's been up for a while. Not as drowsy today compared to yesterday. Reports regular urination overnight.   03/21 0701 - 03/22 0700 In: -  Out: 900 [Urine:900]  Filed Weights   07/16/22 0500 07/18/22 0428 07/21/22 0424  Weight: 75.5 kg 75.8 kg 72.1 kg    Scheduled Meds:  acidophilus  1 capsule Oral TID   atorvastatin  80 mg Oral Daily   carvedilol  6.25 mg Oral BID WC   levothyroxine  88 mcg Oral QAC breakfast   pantoprazole  40 mg Oral BID   QUEtiapine  25 mg Oral Q24H   triamcinolone  1 spray Each Nare Daily   Continuous Infusions: PRN Meds:.acetaminophen **OR** acetaminophen, hydrALAZINE, hydrALAZINE, ondansetron **OR** ondansetron (ZOFRAN) IV, mouth rinse, polyethylene glycol, witch hazel-glycerin  Current Labs: reviewed    Physical Exam:  Blood pressure (!) 124/46, pulse 81, temperature 98.3 F (36.8 C), temperature source Oral, resp. rate (!) 21, height 5\' 3"  (1.6 m), weight 72.1 kg, SpO2 96 %. Gen: Elderly female lying in bed, awake and alert, drowsiness observed yesterday has since resolved.  Cardio: RRR, no murmur, UE edema continues to improve, LE edema persists but is less taught compared to previous exams Abd: Without abdominal wall edema or fluid wave   A&P AKI on CKD IV Unfortunately Cr continues to slowly trend up. I am reassured that she does seem to be making more urine, though documentation has been spotty at best. Objectively, her weight is down 3.7kg over the past 3 days which is very reassuring. No  indication for dialysis. Will need to see stabilization in her labs, but once that occurs would be comfortable with discharge home as early as tomorrow.  - Continue holding Lasix, edema is improving, presume 2/2 recovering renal function despite this not yet being reflected in her labs - Strict I/O, daily weights - Avoid nephrotoxic meds - Trend daily BMP   2.  HTN Remains normotensive after dose reduction of amlodipine from 10mg  > 5mg  yesterday. - Will d/c amlodipine altogether as may be contributing to swelling - Continue Coreg   3.  Acute Blood Loss Anemia No evidence of ongoing blood loss - Per primary  [name] MD 07/21/2022, 9:24 AM  Recent Labs  Lab 07/19/22 0237 07/20/22 0423 07/21/22 0539  NA 137 135 137  K 3.3* 3.2* 3.8  CL 103 103 102  CO2 23 22 22   GLUCOSE 97 100* 104*  BUN 56* 61* 60*  CREATININE 3.98* 4.03* 4.17*  CALCIUM 9.3 8.9 9.1   Recent Labs  Lab 07/18/22 0337 07/19/22 0237 07/19/22 2134 07/20/22 0423  WBC 11.6* 10.8*  --  11.1*  NEUTROABS 6.5 6.0  --  6.7  HGB 7.1* 7.0* 7.5* 7.6*  HCT 21.4* 20.5* 22.0* 22.4*  MCV 93.0 92.8  --  92.2  PLT 327 285  --  272

## 2022-07-21 NOTE — Progress Notes (Signed)
Mobility Specialist - Progress Note   07/21/22 1421  Mobility  Activity Ambulated with assistance in hallway  Level of Assistance Standby assist, set-up cues, supervision of patient - no hands on  Assistive Device Front wheel walker  Distance Ambulated (ft) 400 ft  Activity Response Tolerated well  Mobility Referral Yes  $Mobility charge 1 Mobility   Pt was received in bed and agreeable to mobility. Pt required x1 seated break d/t SOB. No other complaints throughout session. Pt was returned to bed with all needs met.   Franki Monte  Mobility Specialist Please contact via Solicitor or Rehab office at [phone]

## 2022-07-21 NOTE — TOC Progression Note (Addendum)
Transition of Care [facility]) - Progression Note    Patient Details  Name: SHEMIAH DEERY MRN: [id number] Date of Birth: 06/14/1940  Transition of Care [facility]) CM/SW [place], LCSW Phone Number: 07/21/2022, 3:07 PM  Clinical Narrative:    CSW started insurance process, Ref# [id number] with anticipated weekend discharge. CSW updated Katie with [facility]. She will touch base with family to see what they would want to do in the event insurance denies SNF rehab.   Update: Per Joellen Jersey, family will still pay for SNF side at [facility] even if insurance denies.    Expected Discharge Plan: [place] Barriers to Discharge: Continued Medical Work up, Ship broker  Expected Discharge Plan and Services In-house Referral: Clinical Social Work     Living arrangements for the past 2 months: [place]                                       Social Determinants of Health (SDOH) Interventions SDOH Screenings   Food Insecurity: No Food Insecurity (06/13/2022)  Housing: Low Risk  (06/13/2022)  Transportation Needs: No Transportation Needs (06/13/2022)  Utilities: Not At Risk (06/13/2022)  Alcohol Screen: Low Risk  (05/30/2021)  Depression (PHQ2-9): Low Risk  (06/15/2022)  Financial Resource Strain: Low Risk  (06/13/2022)  Physical Activity: Inactive (06/13/2022)  Social Connections: Unknown (06/13/2022)  Stress: No Stress Concern Present (06/13/2022)  Tobacco Use: Medium Risk (07/11/2022)    Readmission Risk Interventions    07/05/2022    9:42 AM  Readmission Risk Prevention Plan  Transportation Screening Complete  Medication Review (RN Care Manager) Complete  PCP or Specialist appointment within 3-5 days of discharge Complete  HRI or [place] Complete  SW Recovery Care/Counseling Consult Complete  Palliative Care Screening Not Applicable  Skilled Nursing Facility Complete

## 2022-07-21 NOTE — Progress Notes (Signed)
PROGRESS NOTE    [name]  [id number] DOB: [birth year] DOA: 07/02/2022 PCP: Cassandria Anger, MD    Brief Narrative: This 82 y.o. female with  history of CKD stage IV, hypertension, rheumatoid arthritis, hypothyroidism, CAD s/p CABG, COPD, hyperlipidemia, DVT/PE. Patient presented secondary to weakness and fever and is found to have evidence of sepsis with a likely urinary source. Empiric antibiotics started and cultures obtained. Patient also found to have hyponatremia with associated dehydration and AKI. IV fluids started. Workup significant for acute pyelonephritis/bacteremia due to E. coli for which she completed antibiotics, as well she did develop melena secondary to gastric/duodenal ulcers. Eliquis discontinued, then resumed and then discontinued, details below. Currently has been having issues with volume overload and rising creatinine and nephrology managing AKI.  Assessment & Plan:   Principal Problem:   Acute pyelonephritis Active Problems:   Acute renal failure superimposed on stage 4 chronic kidney disease (HCC)   Acute metabolic encephalopathy   Hyponatremia   Sepsis with acute renal failure (HCC)   Acquired hypothyroidism   Essential hypertension   Rheumatoid arthritis (HCC)   Anticoagulated   Hx of CABG   Hyperlipidemia   COPD (chronic obstructive pulmonary disease) (HCC)   CKD (chronic kidney disease) stage 4, GFR 15-29 ml/min (HCC) - baseline SCr 2.5. follows with Dr. Royce Macadamia with [facility]   History of pulmonary embolism   Upper GI bleed   Melena   Metabolic acidosis, increased anion gap   Generalized weakness   DNR (do not resuscitate)/DNI(Do Not Intubate)   E coli bacteremia   Dark stools   Anemia   Acute gastric ulcer with hemorrhage   Duodenal ulcer  Sepsis secondary to acute hemorrhagic cystitis : Right pyelonephritis with E. coli bacteremia : Patient presented with non-specific symptoms but has associated fever and leukocytosis.   She  was noted to have significant right flank pain / CVA tenderness on exam .  Urinalysis suggests infection, confirmed on urine culture.  Patient started empirically on Ceftriaxone IV. Associated E. Coli bacteremia Continue Ceftriaxone 2 g IV, narrowed to IV cefazolin, then on Unasyn.  She completed total of 10 days of antibiotics. Sepsis physiology resolved.   Acute blood loss anemia: Acute upper GI bleed due to gastric ulcer/duodenal ulcers: She has developed melena on 3/8, Eliquis was held, GI input greatly appreciated, underwent EGD 07/08/2022, significant for gastric and duodenal ulcer, no evidence of active bleeding, recommendation was to hold anticoagulation for few days and continue Protonix twice daily for 6 weeks and then once daily.  Eliquis resumed on 07/14/2022.  Hemoglobin continued to drop, Eliquis was held for 3 doses until the night of 07/19/2022, hemoglobin eventually dropped to 7.0 on 07/19/2022, 1 unit of PRBC transfusion was given.  Holding Eliquis again.  No obvious source of bleeding.  No reports of hematochezia or melena.  Posttransfusion hemoglobin 7.5 yesterday.   Continue to monitor H&H.  Hyponatremia: > Resolved. Sodium of 124 on admission. Likely related to dehydration and hypovolemia, present on admission.  -Resolved with IV hydration.   Acute metabolic encephalopathy : > Resolved. Likely related to overall illness but more specifically dehydration and hyponatremia.  Patient is from independent living.  She is back to her baseline mental status.   AKI on CKD stage IV: Metabolic acidosis, increased anion gap / fluid overload: Baseline creatinine 2.5.  She follows with Dr. Royce Macadamia of [facility]. Creatinine of 4.70 on admission secondary to poor oral intake in addition to patient's continuing diuretic use.  Creatinine improved to 2.8 but then has been creeping up for last few days and improved somewhat today and currently 3.57. Continue bicarb tablets.  Renal  ultrasound negative for obstruction.  Creatinine improved and plateaued around 3.3 range for few days and then started creeping up, was given diuretic holiday on 07/19/2022 and currently creatinine 4.03.  Nephrology is following.   Essential hypertension: Continue Coreg and amlodipine.  Nephrology has reduced amlodipine dose to 5 mg today   Hypokalemia Replaced.  Continue to monitor   History of pulmonary embolism: Long-term use of anticoagulants: Patient is on Eliquis as an outpatient for the indication of PE/DVT which does not qualify for dose reduction, however patient is on reduced dose of 2.5 mg BID as an outpatient; possibly related to history of GI bleeding.  Eliquis was initially held due to GI bleeding but eventually resumed on 07/14/2022.  Will hold Eliquis again.  Last dose was last night.  We held 3 doses prior to that.   COPD (chronic obstructive pulmonary disease) (HCC) Stable. Asymptomatic.   Hyperlipidemia Continue Lipitor daily   Hx of CABG Noted. No chest pain.   Rheumatoid arthritis ([place]) Patient is on [place] as an outpatient which is held secondary to active infection.   Acquired hypothyroidism Continue Synthroid 88 mcg daily   Generalized weakness Secondary to acute illness. PT/OT recommending SNF.   Plan to discharge to SNF once medically stable.   Acute hypoxic respiratory failure: Patient at times is requiring oxygen, especially when she is laying flat.   She is requiring 1 to 2 L of oxygen.  However she denies any shortness of breath when sitting up.   She has diminished breath sounds at bases, she has been advised multiple times and encouraged to use incentive spirometry but she is not following recommendations, she tends to forget.  Repeat chest x-ray on 07/15/2022 shows atelectasis as well and procalcitonin unremarkable indicates likely atelectasis clinically as well. Continue supplemental oxygen, wean as tolerated   Insomnia/lethargy:  Patient was  started on Seroquel, she was doing good for last couple of days but in the morning was lethargic.  I noticed that she was given her Seroquel at 10:30 PM instead of 8 PM as I had ordered.   Disposition: Patient fluid overloaded, requires more IV diuresis.  Not medically stable for discharge yet.  TOC aware.   DVT prophylaxis:Ted hose Code Status: DNR Family Communication: Daughter at bed side. Disposition Plan:   Status is: Inpatient Remains inpatient appropriate because: Creatinine rising and has fluid overload.    Consultants:  Nephrology  Procedures: None  Antimicrobials: Anti-infectives (From admission, onward)    Start     Dose/Rate Route Frequency Ordered Stop   07/08/22 1245  ampicillin-sulbactam (UNASYN) 1.5 g in sodium chloride 0.9 % 100 mL IVPB        1.5 g 200 mL/hr over 30 Minutes Intravenous Every 12 hours 07/08/22 1154 07/13/22 1030   07/05/22 2000  ceFAZolin (ANCEF) IVPB 2g/100 mL premix  Status:  Discontinued        2 g 200 mL/hr over 30 Minutes Intravenous Every 12 hours 07/05/22 0958 07/05/22 1004   07/05/22 1530  ceFAZolin (ANCEF) IVPB 2g/100 mL premix  Status:  Discontinued        2 g 200 mL/hr over 30 Minutes Intravenous Every 12 hours 07/05/22 1004 07/08/22 1145   07/04/22 0000  cefTRIAXone (ROCEPHIN) 1 g in sodium chloride 0.9 % 100 mL IVPB  Status:  Discontinued  1 g 200 mL/hr over 30 Minutes Intravenous Every 24 hours 07/03/22 0211 07/03/22 1153   07/03/22 1600  cefTRIAXone (ROCEPHIN) 2 g in sodium chloride 0.9 % 100 mL IVPB  Status:  Discontinued        2 g 200 mL/hr over 30 Minutes Intravenous Every 24 hours 07/03/22 1153 07/05/22 0958   07/03/22 0215  cefTRIAXone (ROCEPHIN) 1 g in sodium chloride 0.9 % 100 mL IVPB        1 g 200 mL/hr over 30 Minutes Intravenous  Once 07/03/22 0212 07/03/22 0243   07/03/22 0145  vancomycin (VANCOREADY) IVPB 1250 mg/250 mL  Status:  Discontinued        1,250 mg 166.7 mL/hr over 90 Minutes Intravenous  Once  07/03/22 0132 07/03/22 0210   07/03/22 0130  ceFEPIme (MAXIPIME) 2 g in sodium chloride 0.9 % 100 mL IVPB  Status:  Discontinued        2 g 200 mL/hr over 30 Minutes Intravenous  Once 07/03/22 0124 07/03/22 0210   07/03/22 0130  metroNIDAZOLE (FLAGYL) IVPB 500 mg  Status:  Discontinued        500 mg 100 mL/hr over 60 Minutes Intravenous  Once 07/03/22 0124 07/03/22 0210   07/03/22 0130  vancomycin (VANCOCIN) IVPB 1000 mg/200 mL premix  Status:  Discontinued        1,000 mg 200 mL/hr over 60 Minutes Intravenous  Once 07/03/22 0124 07/03/22 0130      Subjective: Patient was seen and examined at bedside.  Overnight events noted. Patient was sitting comfortably on the chair and was using incentive spirometry.   States she is feeling better.  Awaiting kidney functions to improve before she can be discharged  Objective: Vitals:   07/21/22 1000 07/21/22 1100 07/21/22 1200 07/21/22 1300  BP:    (!) 128/56  Pulse: 83 78 75 77  Resp: 19 20 15 20   Temp:      TempSrc:      SpO2: 94% 92% 95% 93%  Weight:      Height:        Intake/Output Summary (Last 24 hours) at 07/21/2022 1333 Last data filed at 07/21/2022 0900 Gross per 24 hour  Intake --  Output 200 ml  Net -200 ml   Filed Weights   07/16/22 0500 07/18/22 0428 07/21/22 0424  Weight: 75.5 kg 75.8 kg 72.1 kg    Examination:  General exam: Appears calm and comfortable, deconditioned, NAD Respiratory system: Clear to auscultation. Respiratory effort normal. [id number] Cardiovascular system: S1 & S2 heard, regular rate and rhythm, no murmur. Gastrointestinal system: Abdomen is soft, non tender, non distended, BS+ Central nervous system: Alert and oriented X 3. No focal neurological deficits. Extremities: No Edema, no cyanosis, no clubbing Skin: No rashes, lesions or ulcers Psychiatry: Judgement and insight appear normal. Mood & affect appropriate.     Data Reviewed: I have personally reviewed following labs and imaging  studies  CBC: Recent Labs  Lab 07/17/22 0300 07/18/22 0337 07/19/22 0237 07/19/22 2134 07/20/22 0423  WBC 11.8* 11.6* 10.8*  --  11.1*  NEUTROABS 6.4 6.5 6.0  --  6.7  HGB 7.3* 7.1* 7.0* 7.5* 7.6*  HCT 22.2* 21.4* 20.5* 22.0* 22.4*  MCV 93.7 93.0 92.8  --  92.2  PLT 354 327 285  --  [id number]   Basic Metabolic Panel: Recent Labs  Lab 07/17/22 0300 07/18/22 0337 07/19/22 0237 07/20/22 0423 07/21/22 0539  NA 136 137 137 135 137  K 3.8 4.2 3.3*  3.2* 3.8  CL 103 103 103 103 102  CO2 22 21* 23 22 22   GLUCOSE 114* 93 97 100* 104*  BUN 47* 54* 56* 61* 60*  CREATININE 3.61* 3.79* 3.98* 4.03* 4.17*  CALCIUM 9.4 9.3 9.3 8.9 9.1   GFR: Estimated Creatinine Clearance: 9.6 mL/min (A) (by C-G formula based on SCr of 4.17 mg/dL (H)). Liver Function Tests: Recent Labs  Lab 07/19/22 0237 07/20/22 0423 07/21/22 0539  ALBUMIN 2.8* 2.8* 2.8*   No results for input(s): "LIPASE", "AMYLASE" in the last 168 hours. No results for input(s): "AMMONIA" in the last 168 hours. Coagulation Profile: No results for input(s): "INR", "PROTIME" in the last 168 hours. Cardiac Enzymes: No results for input(s): "CKTOTAL", "CKMB", "CKMBINDEX", "TROPONINI" in the last 168 hours. BNP (last 3 results) No results for input(s): "PROBNP" in the last 8760 hours. HbA1C: No results for input(s): "HGBA1C" in the last 72 hours. CBG: No results for input(s): "GLUCAP" in the last 168 hours. Lipid Profile: No results for input(s): "CHOL", "HDL", "LDLCALC", "TRIG", "CHOLHDL", "LDLDIRECT" in the last 72 hours. Thyroid Function Tests: No results for input(s): "TSH", "T4TOTAL", "FREET4", "T3FREE", "THYROIDAB" in the last 72 hours. Anemia Panel: No results for input(s): "VITAMINB12", "FOLATE", "FERRITIN", "TIBC", "IRON", "RETICCTPCT" in the last 72 hours. Sepsis Labs: Recent Labs  Lab 07/16/22 0759  PROCALCITON 0.18    No results found for this or any previous visit (from the past 240 hour(s)).   Radiology  Studies: No results found.  Scheduled Meds:  acidophilus  1 capsule Oral TID   atorvastatin  80 mg Oral Daily   carvedilol  6.25 mg Oral BID WC   levothyroxine  88 mcg Oral QAC breakfast   pantoprazole  40 mg Oral BID   QUEtiapine  25 mg Oral Q24H   triamcinolone  1 spray Each Nare Daily   Continuous Infusions:   LOS: 18 days    Time spent: 50 mins    Duard Brady, MD [facility]   If 7PM-7AM, please contact night-coverage

## 2022-07-22 DIAGNOSIS — N10 Acute tubulo-interstitial nephritis: Secondary | ICD-10-CM | POA: Diagnosis not present

## 2022-07-22 LAB — BASIC METABOLIC PANEL
Anion gap: 10 (ref 5–15)
BUN: 63 mg/dL — ABNORMAL HIGH (ref 8–23)
CO2: 21 mmol/L — ABNORMAL LOW (ref 22–32)
Calcium: 8.8 mg/dL — ABNORMAL LOW (ref 8.9–10.3)
Chloride: 105 mmol/L (ref 98–111)
Creatinine, Ser: 3.89 mg/dL — ABNORMAL HIGH (ref 0.44–1.00)
GFR, Estimated: 11 mL/min — ABNORMAL LOW (ref >60)
Glucose, Bld: 121 mg/dL — ABNORMAL HIGH (ref 70–99)
Potassium: 3.7 mmol/L (ref 3.5–5.1)
Sodium: 136 mmol/L (ref 135–145)

## 2022-07-22 LAB — CBC
HCT: 23.6 % — ABNORMAL LOW (ref 36.0–46.0)
Hemoglobin: 7.7 g/dL — ABNORMAL LOW (ref 12.0–15.0)
MCH: 30.8 pg (ref 26.0–34.0)
MCHC: 32.6 g/dL (ref 30.0–36.0)
MCV: 94.4 fL (ref 80.0–100.0)
Platelets: 243 10*3/uL (ref 150–400)
RBC: 2.5 MIL/uL — ABNORMAL LOW (ref 3.87–5.11)
RDW: 17.5 % — ABNORMAL HIGH (ref 11.5–15.5)
WBC: 10.7 10*3/uL — ABNORMAL HIGH (ref 4.0–10.5)
nRBC: 0.3 % — ABNORMAL HIGH (ref 0.0–0.2)

## 2022-07-22 LAB — URINALYSIS, ROUTINE W REFLEX MICROSCOPIC
Bilirubin Urine: NEGATIVE
Glucose, UA: NEGATIVE mg/dL
Ketones, ur: NEGATIVE mg/dL
Nitrite: NEGATIVE
Protein, ur: 100 mg/dL — AB
Specific Gravity, Urine: 1.009 (ref 1.005–1.030)
pH: 5 (ref 5.0–8.0)

## 2022-07-22 LAB — PHOSPHORUS: Phosphorus: 4.4 mg/dL (ref 2.5–4.6)

## 2022-07-22 LAB — MAGNESIUM: Magnesium: 1.3 mg/dL — ABNORMAL LOW (ref 1.7–2.4)

## 2022-07-22 MED ORDER — MAGNESIUM SULFATE 2 GM/50ML IV SOLN
2.0000 g | Freq: Once | INTRAVENOUS | Status: AC
  Administered 2022-07-22: 2 g via INTRAVENOUS
  Filled 2022-07-22: qty 50

## 2022-07-22 MED ORDER — APIXABAN 2.5 MG PO TABS
2.5000 mg | ORAL_TABLET | Freq: Two times a day (BID) | ORAL | Status: DC
  Administered 2022-07-22 – 2022-07-25 (×7): 2.5 mg via ORAL
  Filled 2022-07-22 (×7): qty 1

## 2022-07-22 MED ORDER — LIDOCAINE 5 % EX PTCH
1.0000 | MEDICATED_PATCH | CUTANEOUS | Status: DC
  Administered 2022-07-22 – 2022-07-23 (×2): 1 via TRANSDERMAL
  Filled 2022-07-22 (×3): qty 1

## 2022-07-22 NOTE — TOC Progression Note (Signed)
Transition of Care [facility]) - Progression Note    Patient Details  Name: [name] MRN: [id number] Date of Birth: [birth year]  Transition of Care [facility]) CM/SW Contact  [address], [place], [place] Phone Number: 07/22/2022, 1:36 PM  Clinical Narrative:     Additional clinicals faxed to [place] for SNF authorization-ref# [id number]  [facility], LCSW Transition of Care   Expected Discharge Plan: [place] Barriers to Discharge: Continued Medical Work up, Ship broker  Expected Discharge Plan and Services In-house Referral: Clinical Social Work     Living arrangements for the past 2 months: [place]                                       Social Determinants of Health (SDOH) Interventions SDOH Screenings   Food Insecurity: No Food Insecurity (06/13/2022)  Housing: Low Risk  (06/13/2022)  Transportation Needs: No Transportation Needs (06/13/2022)  Utilities: Not At Risk (06/13/2022)  Alcohol Screen: Low Risk  (05/30/2021)  Depression (PHQ2-9): Low Risk  (06/15/2022)  Financial Resource Strain: Low Risk  (06/13/2022)  Physical Activity: Inactive (06/13/2022)  Social Connections: Unknown (06/13/2022)  Stress: No Stress Concern Present (06/13/2022)  Tobacco Use: Medium Risk (07/11/2022)    Readmission Risk Interventions    07/05/2022    9:42 AM  Readmission Risk Prevention Plan  Transportation Screening Complete  Medication Review (RN Care Manager) Complete  PCP or Specialist appointment within 3-5 days of discharge Complete  HRI or [place] Complete  SW Recovery Care/Counseling Consult Complete  [place] Complete

## 2022-07-22 NOTE — Progress Notes (Signed)
Admit: 07/02/2022 LOS: 54  [name] is an 82yo female with history of CKD IV who follows with Dr. Royce Macadamia, HTN, RA, hypothyroidism, CAD s/p CABG, COPD, HLD, DVT/PE who presented with weakness and fever, found to have pyelonephritis and urosepsis. Hospital course complicated by acute blood loss anemia 2/2 gastric/duodenal ulcers. Nephrology consulted due to AKI on CKD IV.   Subjective:  Ongoing back pain.  Feels like her edema is about the same.  No other complaints today.  03/22 0701 - 03/23 0700 In: -  Out: 100 [Urine:100]  Filed Weights   07/16/22 0500 07/18/22 0428 07/21/22 0424  Weight: 75.5 kg 75.8 kg 72.1 kg    Scheduled Meds:  acidophilus  1 capsule Oral TID   atorvastatin  80 mg Oral Daily   carvedilol  6.25 mg Oral BID WC   levothyroxine  88 mcg Oral QAC breakfast   pantoprazole  40 mg Oral BID   QUEtiapine  25 mg Oral Q24H   triamcinolone  1 spray Each Nare Daily   Continuous Infusions:  magnesium sulfate bolus IVPB     PRN Meds:.acetaminophen **OR** acetaminophen, hydrALAZINE, hydrALAZINE, ondansetron **OR** ondansetron (ZOFRAN) IV, mouth rinse, polyethylene glycol, witch hazel-glycerin  Current Labs: reviewed    Physical Exam:  Blood pressure (!) 127/46, pulse 84, temperature 98.3 F (36.8 C), temperature source Oral, resp. rate 20, height 5\' 3"  (1.6 m), weight 72.1 kg, SpO2 94 %. Gen: Sitting in chair, no distress Cardio: Normal rate, regular rhythm, left upper extremity edema 2+, 2+ edema in the bilateral lower extremities, warm and well-perfused Abd: NABS, no distention, soft  A&P AKI on CKD IV AKI possibly related to dehydration, some degree of ATN, pyelonephritis.  Continues to have decent urine output.  Slightly improved creatinine today.   - Continue holding Lasix, continue to monitor edema and creatinine - Strict I/O, daily weights - Avoid nephrotoxic meds - Trend daily BMP -Patient is likely nearing ability to leave the hospital from a renal  perspective.  She does have significant debilitation so likely will require rehab   2.  HTN Stopped amlodipine due to concern regarding lower blood pressures and edema - Continue Coreg   3.  Acute Blood Loss Anemia No evidence of ongoing blood loss - Per primary   07/22/2022, 9:22 AM  Recent Labs  Lab 07/20/22 0423 07/21/22 0539 07/22/22 0247  NA 135 137 136  K 3.2* 3.8 3.7  CL 103 102 105  CO2 22 22 21*  GLUCOSE 100* 104* 121*  BUN 61* 60* 63*  CREATININE 4.03* 4.17* 3.89*  CALCIUM 8.9 9.1 8.8*  PHOS  --   --  4.4   Recent Labs  Lab 07/18/22 0337 07/19/22 0237 07/19/22 2134 07/20/22 0423 07/22/22 0247  WBC 11.6* 10.8*  --  11.1* 10.7*  NEUTROABS 6.5 6.0  --  6.7  --   HGB 7.1* 7.0* 7.5* 7.6* 7.7*  HCT 21.4* 20.5* 22.0* 22.4* 23.6*  MCV 93.0 92.8  --  92.2 94.4  PLT 327 285  --  272 243

## 2022-07-22 NOTE — Progress Notes (Signed)
PROGRESS NOTE    GENAVEE FIREBAUGH  [id number] DOB: 1940/10/15 DOA: 07/02/2022 PCP: Cassandria Anger, MD    Brief Narrative: This 82 y.o. female with  history of CKD stage IV, hypertension, rheumatoid arthritis, hypothyroidism, CAD s/p CABG, COPD, hyperlipidemia, DVT/PE. Patient presented secondary to weakness and fever and is found to have evidence of sepsis with a likely urinary source. Empiric antibiotics started and cultures obtained. Patient also found to have hyponatremia with associated dehydration and AKI. IV fluids started. Workup significant for acute pyelonephritis/bacteremia due to E. coli for which she completed antibiotics, as well she did develop melena secondary to gastric/duodenal ulcers. Eliquis discontinued, then resumed and then discontinued, details below. Currently has been having issues with volume overload and rising creatinine and nephrology managing AKI.  Assessment & Plan:   Principal Problem:   Acute pyelonephritis Active Problems:   Acute renal failure superimposed on stage 4 chronic kidney disease (HCC)   Acute metabolic encephalopathy   Hyponatremia   Sepsis with acute renal failure (HCC)   Acquired hypothyroidism   Essential hypertension   Rheumatoid arthritis (HCC)   Anticoagulated   Hx of CABG   Hyperlipidemia   COPD (chronic obstructive pulmonary disease) (HCC)   CKD (chronic kidney disease) stage 4, GFR 15-29 ml/min (HCC) - baseline SCr 2.5. follows with Dr. Royce Macadamia with [facility]   History of pulmonary embolism   Upper GI bleed   Melena   Metabolic acidosis, increased anion gap   Generalized weakness   DNR (do not resuscitate)/DNI(Do Not Intubate)   E coli bacteremia   Dark stools   Anemia   Acute gastric ulcer with hemorrhage   Duodenal ulcer  Sepsis secondary to acute hemorrhagic cystitis : Right pyelonephritis with E. coli bacteremia : She was noted to have significant right flank pain / CVA tenderness on exam .  Urinalysis  suggests infection, confirmed on urine culture.  Patient started empirically on Ceftriaxone IV. Associated E. Coli bacteremia Continued on Ceftriaxone 2 g IV, narrowed to IV cefazolin, then on Unasyn.   She completed total of 10 days of antibiotics. Sepsis physiology resolved.   Acute blood loss anemia: Acute upper GI bleed due to gastric ulcer/duodenal ulcers: She has developed melena on 3/8, Eliquis was held, GI input greatly appreciated, underwent EGD 07/08/2022, significant for gastric and duodenal ulcer, no evidence of active bleeding, recommendation was to hold anticoagulation for few days and continue Protonix twice daily for 6 weeks and then once daily.  Eliquis resumed on 07/14/2022.  Hemoglobin continued to drop, Eliquis was held for 3 doses until the night of 07/19/2022, hemoglobin eventually dropped to 7.0 on 07/19/2022, 1 unit of PRBC transfusion was given.  Holding Eliquis again.  No obvious source of bleeding.  No reports of hematochezia or melena.  Posttransfusion hemoglobin 7.7 today.   Continue to monitor H&H.  Hyponatremia: > Resolved. Sodium of 124 on admission. Likely related to dehydration and hypovolemia, present on admission.  -Resolved with IV hydration.   Acute metabolic encephalopathy : > Resolved. Likely related to overall illness but more specifically dehydration and hyponatremia.  Patient is from independent living.  She is back to her baseline mental status.   AKI on CKD stage IV: Metabolic acidosis, increased anion gap / fluid overload: Baseline creatinine 2.5.  She follows with Dr. Royce Macadamia of [facility]. Creatinine of 4.70 on admission secondary to poor oral intake in addition to patient's continuing diuretic use.   Creatinine improved to 2.8 but then has been creeping  up for last few days and improved somewhat  and currently 3.57. Continue bicarb tablets.  Renal ultrasound negative for obstruction.  Creatinine improved and plateaued around 3.3 range for few  days and then started creeping up, was given diuretic holiday on 07/19/2022 and currently creatinine 4.03.  Nephrology is following. Creatinine is improving 3.89 today,    Essential hypertension: Continue Coreg and amlodipine.  Nephrology has reduced amlodipine dose to 5 mg daily.   Hypokalemia Replaced. Resolved.   History of pulmonary embolism: Long-term use of anticoagulants: Patient is on Eliquis as an outpatient for the indication of PE/DVT which does not qualify for dose reduction, however patient is on reduced dose of 2.5 mg BID as an outpatient; possibly related to history of GI bleeding.  Eliquis was initially held due to GI bleeding but eventually resumed on 07/14/2022.  Will hold Eliquis again.   Resume Eliquis today and check H/H.   COPD (chronic obstructive pulmonary disease) (HCC) Stable. Asymptomatic.   Hyperlipidemia Continue Lipitor daily   Hx of CABG Noted. No chest pain.   Rheumatoid arthritis ([place]) Patient is on [place] as an outpatient which is held secondary to active infection.   Acquired hypothyroidism Continue Synthroid 88 mcg daily   Generalized weakness Secondary to acute illness. PT/OT recommending SNF.   Plan to discharge to SNF once medically stable.   Acute hypoxic respiratory failure: Patient at times is requiring oxygen, especially when she is laying flat.   She is requiring 1 to 2 L of oxygen.  However she denies any shortness of breath when sitting up.   She has diminished breath sounds at bases, she has been advised multiple times and encouraged to use incentive spirometry but she is not following recommendations, she tends to forget.  Repeat chest x-ray on 07/15/2022 shows atelectasis as well and procalcitonin unremarkable indicates likely atelectasis clinically as well. Continue supplemental oxygen, wean as tolerated.   Insomnia/lethargy:  Patient was started on Seroquel, she was doing good for last couple of days but in the morning was  lethargic. I noticed that she was given her Seroquel at 10:30 PM instead of 8 PM as I had ordered.   Disposition: Patient's fluid overloaded, requires more IV diuresis.  Not medically stable for discharge yet.  TOC aware.   DVT prophylaxis:Ted hose Code Status: DNR Family Communication: Daughter at bed side. Disposition Plan:   Status is: Inpatient Remains inpatient appropriate because: Creatinine rising and has fluid overload.    Consultants:  Nephrology  Procedures: None  Antimicrobials: Anti-infectives (From admission, onward)    Start     Dose/Rate Route Frequency Ordered Stop   07/08/22 1245  ampicillin-sulbactam (UNASYN) 1.5 g in sodium chloride 0.9 % 100 mL IVPB        1.5 g 200 mL/hr over 30 Minutes Intravenous Every 12 hours 07/08/22 1154 07/13/22 1030   07/05/22 2000  ceFAZolin (ANCEF) IVPB 2g/100 mL premix  Status:  Discontinued        2 g 200 mL/hr over 30 Minutes Intravenous Every 12 hours 07/05/22 0958 07/05/22 1004   07/05/22 1530  ceFAZolin (ANCEF) IVPB 2g/100 mL premix  Status:  Discontinued        2 g 200 mL/hr over 30 Minutes Intravenous Every 12 hours 07/05/22 1004 07/08/22 1145   07/04/22 0000  cefTRIAXone (ROCEPHIN) 1 g in sodium chloride 0.9 % 100 mL IVPB  Status:  Discontinued        1 g 200 mL/hr over 30 Minutes Intravenous Every  24 hours 07/03/22 0211 07/03/22 1153   07/03/22 1600  cefTRIAXone (ROCEPHIN) 2 g in sodium chloride 0.9 % 100 mL IVPB  Status:  Discontinued        2 g 200 mL/hr over 30 Minutes Intravenous Every 24 hours 07/03/22 1153 07/05/22 0958   07/03/22 0215  cefTRIAXone (ROCEPHIN) 1 g in sodium chloride 0.9 % 100 mL IVPB        1 g 200 mL/hr over 30 Minutes Intravenous  Once 07/03/22 0212 07/03/22 0243   07/03/22 0145  vancomycin (VANCOREADY) IVPB 1250 mg/250 mL  Status:  Discontinued        1,250 mg 166.7 mL/hr over 90 Minutes Intravenous  Once 07/03/22 0132 07/03/22 0210   07/03/22 0130  ceFEPIme (MAXIPIME) 2 g in sodium chloride  0.9 % 100 mL IVPB  Status:  Discontinued        2 g 200 mL/hr over 30 Minutes Intravenous  Once 07/03/22 0124 07/03/22 0210   07/03/22 0130  metroNIDAZOLE (FLAGYL) IVPB 500 mg  Status:  Discontinued        500 mg 100 mL/hr over 60 Minutes Intravenous  Once 07/03/22 0124 07/03/22 0210   07/03/22 0130  vancomycin (VANCOCIN) IVPB 1000 mg/200 mL premix  Status:  Discontinued        1,000 mg 200 mL/hr over 60 Minutes Intravenous  Once 07/03/22 0124 07/03/22 0130      Subjective: Patient was seen and examined at bedside. Overnight events noted. Patient was sitting comfortably on the chair and was using incentive spirometry. Patient's kidney functions are improving. Son and daughter at bedside helping patient to have physical therapy.  Objective: Vitals:   07/21/22 1300 07/21/22 1947 07/21/22 2311 07/22/22 0322  BP: (!) 128/56 (!) 111/46 (!) 139/58 (!) 127/46  Pulse: 77 77 79 84  Resp: 20 20 16 20   Temp:  98.2 F (36.8 C) 98.3 F (36.8 C) 98.3 F (36.8 C)  TempSrc:  Oral Oral Oral  SpO2: 93% 92% (!) 89% 94%  Weight:      Height:       No intake or output data in the 24 hours ending 07/22/22 1142  Filed Weights   07/16/22 0500 07/18/22 0428 07/21/22 0424  Weight: 75.5 kg 75.8 kg 72.1 kg    Examination:  General exam: Appears calm and comfortable, deconditioned, NAD. Respiratory system: CTA bilaterally, respiratory effort normal, RR 16. Cardiovascular system: S1 & S2 heard, regular rate and rhythm, no murmur. Gastrointestinal system: Abdomen is soft, non tender, non distended, BS+ Central nervous system: Alert and oriented X 3. No focal neurological deficits. Extremities: No Edema, no cyanosis, no clubbing Skin: No rashes, lesions or ulcers Psychiatry: Judgement and insight appear normal. Mood & affect appropriate.     Data Reviewed: I have personally reviewed following labs and imaging studies  CBC: Recent Labs  Lab 07/17/22 0300 07/18/22 0337 07/19/22 0237  07/19/22 2134 07/20/22 0423 07/22/22 0247  WBC 11.8* 11.6* 10.8*  --  11.1* 10.7*  NEUTROABS 6.4 6.5 6.0  --  6.7  --   HGB 7.3* 7.1* 7.0* 7.5* 7.6* 7.7*  HCT 22.2* 21.4* 20.5* 22.0* 22.4* 23.6*  MCV 93.7 93.0 92.8  --  92.2 94.4  PLT 354 327 285  --  272 0000000   Basic Metabolic Panel: Recent Labs  Lab 07/18/22 0337 07/19/22 0237 07/20/22 0423 07/21/22 0539 07/22/22 0247  NA 137 137 135 137 136  K 4.2 3.3* 3.2* 3.8 3.7  CL 103 103 103 102  105  CO2 21* 23 22 22  21*  GLUCOSE 93 97 100* 104* 121*  BUN 54* 56* 61* 60* 63*  CREATININE 3.79* 3.98* 4.03* 4.17* 3.89*  CALCIUM 9.3 9.3 8.9 9.1 8.8*  MG  --   --   --   --  1.3*  PHOS  --   --   --   --  4.4   GFR: Estimated Creatinine Clearance: 10.2 mL/min (A) (by C-G formula based on SCr of 3.89 mg/dL (H)). Liver Function Tests: Recent Labs  Lab 07/19/22 0237 07/20/22 0423 07/21/22 0539  ALBUMIN 2.8* 2.8* 2.8*   No results for input(s): "LIPASE", "AMYLASE" in the last 168 hours. No results for input(s): "AMMONIA" in the last 168 hours. Coagulation Profile: No results for input(s): "INR", "PROTIME" in the last 168 hours. Cardiac Enzymes: No results for input(s): "CKTOTAL", "CKMB", "CKMBINDEX", "TROPONINI" in the last 168 hours. BNP (last 3 results) No results for input(s): "PROBNP" in the last 8760 hours. HbA1C: No results for input(s): "HGBA1C" in the last 72 hours. CBG: No results for input(s): "GLUCAP" in the last 168 hours. Lipid Profile: No results for input(s): "CHOL", "HDL", "LDLCALC", "TRIG", "CHOLHDL", "LDLDIRECT" in the last 72 hours. Thyroid Function Tests: No results for input(s): "TSH", "T4TOTAL", "FREET4", "T3FREE", "THYROIDAB" in the last 72 hours. Anemia Panel: No results for input(s): "VITAMINB12", "FOLATE", "FERRITIN", "TIBC", "IRON", "RETICCTPCT" in the last 72 hours. Sepsis Labs: Recent Labs  Lab 07/16/22 0759  PROCALCITON 0.18    No results found for this or any previous visit (from the past  240 hour(s)).   Radiology Studies: No results found.  Scheduled Meds:  acidophilus  1 capsule Oral TID   apixaban  2.5 mg Oral BID   atorvastatin  80 mg Oral Daily   carvedilol  6.25 mg Oral BID WC   levothyroxine  88 mcg Oral QAC breakfast   lidocaine  1 patch Transdermal Q24H   pantoprazole  40 mg Oral BID   QUEtiapine  25 mg Oral Q24H   triamcinolone  1 spray Each Nare Daily   Continuous Infusions:  magnesium sulfate bolus IVPB       LOS: 19 days    Time spent: 35 mins    Duard Brady, MD [facility]   If 7PM-7AM, please contact night-coverage

## 2022-07-23 DIAGNOSIS — N10 Acute tubulo-interstitial nephritis: Secondary | ICD-10-CM | POA: Diagnosis not present

## 2022-07-23 LAB — CBC
HCT: 24.1 % — ABNORMAL LOW (ref 36.0–46.0)
Hemoglobin: 7.9 g/dL — ABNORMAL LOW (ref 12.0–15.0)
MCH: 30.5 pg (ref 26.0–34.0)
MCHC: 32.8 g/dL (ref 30.0–36.0)
MCV: 93.1 fL (ref 80.0–100.0)
Platelets: 220 10*3/uL (ref 150–400)
RBC: 2.59 MIL/uL — ABNORMAL LOW (ref 3.87–5.11)
RDW: 17.6 % — ABNORMAL HIGH (ref 11.5–15.5)
WBC: 8.3 10*3/uL (ref 4.0–10.5)
nRBC: 0.2 % (ref 0.0–0.2)

## 2022-07-23 LAB — BASIC METABOLIC PANEL
Anion gap: 12 (ref 5–15)
BUN: 63 mg/dL — ABNORMAL HIGH (ref 8–23)
CO2: 22 mmol/L (ref 22–32)
Calcium: 9.1 mg/dL (ref 8.9–10.3)
Chloride: 103 mmol/L (ref 98–111)
Creatinine, Ser: 3.95 mg/dL — ABNORMAL HIGH (ref 0.44–1.00)
GFR, Estimated: 11 mL/min — ABNORMAL LOW (ref >60)
Glucose, Bld: 107 mg/dL — ABNORMAL HIGH (ref 70–99)
Potassium: 3.8 mmol/L (ref 3.5–5.1)
Sodium: 137 mmol/L (ref 135–145)

## 2022-07-23 LAB — MAGNESIUM: Magnesium: 1.8 mg/dL (ref 1.7–2.4)

## 2022-07-23 LAB — PHOSPHORUS: Phosphorus: 4.8 mg/dL — ABNORMAL HIGH (ref 2.5–4.6)

## 2022-07-23 NOTE — Progress Notes (Signed)
Admit: 07/02/2022 LOS: 20  [name] is an 82yo female with history of CKD IV who follows with Dr. Royce Macadamia, HTN, RA, hypothyroidism, CAD s/p CABG, COPD, HLD, DVT/PE who presented with weakness and fever, found to have pyelonephritis and urosepsis. Hospital course complicated by acute blood loss anemia 2/2 gastric/duodenal ulcers. Nephrology consulted due to AKI on CKD IV.   Subjective:  Patient has ongoing back pain.  Otherwise without significant changes or complaints today.  No intake/output data recorded.  Filed Weights   07/16/22 0500 07/18/22 0428 07/21/22 0424  Weight: 75.5 kg 75.8 kg 72.1 kg    Scheduled Meds:  acidophilus  1 capsule Oral TID   apixaban  2.5 mg Oral BID   atorvastatin  80 mg Oral Daily   carvedilol  6.25 mg Oral BID WC   levothyroxine  88 mcg Oral QAC breakfast   lidocaine  1 patch Transdermal Q24H   pantoprazole  40 mg Oral BID   QUEtiapine  25 mg Oral Q24H   triamcinolone  1 spray Each Nare Daily   Continuous Infusions:   PRN Meds:.acetaminophen **OR** acetaminophen, hydrALAZINE, hydrALAZINE, ondansetron **OR** ondansetron (ZOFRAN) IV, mouth rinse, polyethylene glycol, witch hazel-glycerin  Current Labs: reviewed    Physical Exam:  Blood pressure (!) 141/61, pulse 87, temperature 98.3 F (36.8 C), temperature source Oral, resp. rate 20, height 5\' 3"  (1.6 m), weight 72.1 kg, SpO2 92 %. Gen: Sitting in chair, no distress Cardio: Normal rate, regular rhythm, left upper extremity edema 2+, 2+ edema in the bilateral lower extremities, warm and well-perfused Abd: NABS, no distention, soft  A&P AKI on CKD IV AKI possibly related to dehydration, some degree of ATN, pyelonephritis.  Continues to have decent urine output.  Creatinine seems to be fluctuating around 4 at this time - Continue holding Lasix, continue to monitor edema and creatinine -Could consider administering Lasix in the next day or 2 to help with her edema - Strict I/O, daily weights -  Avoid nephrotoxic meds - Trend daily BMP   2.  HTN Stopped amlodipine due to concern regarding lower blood pressures and edema - Continue Coreg   3.  Acute Blood Loss Anemia No evidence of ongoing blood loss - Per primary   07/23/2022, 8:24 AM  Recent Labs  Lab 07/21/22 0539 07/22/22 0247 07/23/22 0645  NA 137 136 137  K 3.8 3.7 3.8  CL 102 105 103  CO2 22 21* 22  GLUCOSE 104* 121* 107*  BUN 60* 63* 63*  CREATININE 4.17* 3.89* 3.95*  CALCIUM 9.1 8.8* 9.1  PHOS  --  4.4 4.8*   Recent Labs  Lab 07/18/22 0337 07/19/22 0237 07/19/22 2134 07/20/22 0423 07/22/22 0247 07/23/22 0327  WBC 11.6* 10.8*  --  11.1* 10.7* 8.3  NEUTROABS 6.5 6.0  --  6.7  --   --   HGB 7.1* 7.0*   < > 7.6* 7.7* 7.9*  HCT 21.4* 20.5*   < > 22.4* 23.6* 24.1*  MCV 93.0 92.8  --  92.2 94.4 93.1  PLT 327 285  --  272 243 220   < > = values in this interval not displayed.

## 2022-07-23 NOTE — Progress Notes (Signed)
Mobility Specialist Progress Note   07/23/22 1812  Mobility  Activity  (Seated exerciss in chair)  Level of Assistance Minimal assist, patient does 75% or more  Assistive Device Other (Comment) (HHA)  Range of Motion/Exercises Right leg;Left leg  Activity Response Tolerated well  Mobility Referral Yes  $Mobility charge 1 Mobility   Received pt in chair c/o right lower back pain and deferrign ambulation today but agreeable to seated level exercises. Pt able to finish x4 LE exercises but pt still expressing discomfort. Left in chair and notified RN.   Holland Falling Mobility Specialist Please contact via SecureChat or  Rehab office at [phone]

## 2022-07-23 NOTE — Progress Notes (Signed)
PROGRESS NOTE    [name]  [id number] DOB: Dec 25, 1940 DOA: 07/02/2022 PCP: Cassandria Anger, MD    Brief Narrative: This 82 y.o. female with  history of CKD stage IV, hypertension, rheumatoid arthritis, hypothyroidism, CAD s/p CABG, COPD, hyperlipidemia, DVT/PE. Patient presented secondary to weakness and fever and is found to have evidence of sepsis with a likely urinary source. Empiric antibiotics started and cultures obtained. Patient also found to have hyponatremia with associated dehydration and AKI. IV fluids started. Workup significant for acute pyelonephritis/bacteremia due to E. coli for which she completed antibiotics, as well she did develop melena secondary to gastric/duodenal ulcers. Eliquis discontinued, then resumed and then discontinued, details below. Currently has been having issues with volume overload and rising creatinine and nephrology managing AKI.  Assessment & Plan:   Principal Problem:   Acute pyelonephritis Active Problems:   Acute renal failure superimposed on stage 4 chronic kidney disease (HCC)   Acute metabolic encephalopathy   Hyponatremia   Sepsis with acute renal failure (HCC)   Acquired hypothyroidism   Essential hypertension   Rheumatoid arthritis (HCC)   Anticoagulated   Hx of CABG   Hyperlipidemia   COPD (chronic obstructive pulmonary disease) (HCC)   CKD (chronic kidney disease) stage 4, GFR 15-29 ml/min (HCC) - baseline SCr 2.5. follows with Dr. Royce Macadamia with [facility]   History of pulmonary embolism   Upper GI bleed   Melena   Metabolic acidosis, increased anion gap   Generalized weakness   DNR (do not resuscitate)/DNI(Do Not Intubate)   E coli bacteremia   Dark stools   Anemia   Acute gastric ulcer with hemorrhage   Duodenal ulcer  Sepsis secondary to acute hemorrhagic cystitis : Right pyelonephritis with E. coli bacteremia : She was noted to have significant right flank pain / CVA tenderness on exam .  Urinalysis  suggests infection, confirmed on urine culture.  Patient started empirically on Ceftriaxone IV. Associated E. Coli bacteremia Continued on Ceftriaxone 2 g IV, narrowed to IV cefazolin, then on Unasyn.   She completed total of 10 days of antibiotics. Sepsis physiology resolved.   Acute blood loss anemia: Acute upper GI bleed due to gastric ulcer/duodenal ulcers: She has developed melena on 3/8, Eliquis was held, GI input greatly appreciated, underwent EGD 07/08/2022, significant for gastric and duodenal ulcer, no evidence of active bleeding, recommendation was to hold anticoagulation for few days and continue Protonix twice daily for 6 weeks and then once daily.  Eliquis resumed on 07/14/2022.  Hemoglobin continued to drop, Eliquis was held for 3 doses until the night of 07/19/2022, hemoglobin eventually dropped to 7.0 on 07/19/2022, 1 unit of PRBC transfusion was given.  Holding Eliquis again.  No obvious source of bleeding.  No reports of hematochezia or melena.  Posttransfusion hemoglobin 7.9 today.   Continue to monitor H&H.  Hyponatremia: > Resolved. Sodium of 124 on admission. Likely related to dehydration and hypovolemia, present on admission.  -Resolved with IV hydration.   Acute metabolic encephalopathy : > Resolved. Likely related to overall illness but more specifically dehydration and hyponatremia.  Patient is from independent living.  She is back to her baseline mental status.   AKI on CKD stage IV: Metabolic acidosis, increased anion gap / fluid overload: Baseline creatinine 2.5.  She follows with Dr. Royce Macadamia of [facility]. Creatinine of 4.70 on admission secondary to poor oral intake in addition to patient's continuing diuretic use.   Creatinine improved to 2.8 but then has been creeping  up for last few days and improved somewhat  and currently 3.57. Continue bicarb tablets.  Renal ultrasound negative for obstruction.  Creatinine improved and plateaued around 3.3 range for few  days and then started creeping up, was given diuretic holiday on 07/19/2022 and currently creatinine 4.03.  Nephrology is following. Creatinine is improving 3.95 today,    Essential hypertension: Continue Coreg and amlodipine.  Nephrology has reduced amlodipine dose to 5 mg daily.   Hypokalemia Replaced. Resolved.   History of pulmonary embolism: Long-term use of anticoagulants: Patient is on Eliquis as an outpatient for the indication of PE/DVT which does not qualify for dose reduction, however patient is on reduced dose of 2.5 mg BID as an outpatient; possibly related to history of GI bleeding.  Eliquis was initially held due to GI bleeding but eventually resumed on 07/14/2022.  Will hold Eliquis again.   Resumed Eliquis on  07/23/22 and check H/H.   COPD (chronic obstructive pulmonary disease) (HCC) Stable. Asymptomatic.   Hyperlipidemia Continue Lipitor daily   Hx of CABG Noted. No chest pain.   Rheumatoid arthritis ([place]) Patient is on Purcell as an outpatient which is held secondary to active infection.   Acquired hypothyroidism Continue Synthroid 88 mcg daily   Generalized weakness Secondary to acute illness. PT/OT recommending SNF.   Plan to discharge to SNF once medically stable.   Acute hypoxic respiratory failure: Patient at times is requiring oxygen, especially when she is laying flat.   She is requiring 1 to 2 L of oxygen.  However she denies any shortness of breath when sitting up.   She has diminished breath sounds at bases, she has been advised multiple times and encouraged to use incentive spirometry but she is not following recommendations, she tends to forget.  Repeat chest x-ray on 07/15/2022 shows atelectasis as well and procalcitonin unremarkable indicates likely atelectasis clinically as well. Continue supplemental oxygen, wean as tolerated.   Insomnia/lethargy:  Patient was started on Seroquel, she was doing good for last couple of days but in the morning was  lethargic. I noticed that she was given her Seroquel at 10:30 PM instead of 8 PM as I had ordered.   Disposition: Patient's fluid overloaded, requires more IV diuresis.  Not medically stable for discharge yet.  TOC aware.   DVT prophylaxis:Ted hose Code Status: DNR Family Communication: Daughter at bed side. Disposition Plan:   Status is: Inpatient Remains inpatient appropriate because: Creatinine rising and has fluid overload.    Consultants:  Nephrology  Procedures: None  Antimicrobials: Anti-infectives (From admission, onward)    Start     Dose/Rate Route Frequency Ordered Stop   07/08/22 1245  ampicillin-sulbactam (UNASYN) 1.5 g in sodium chloride 0.9 % 100 mL IVPB        1.5 g 200 mL/hr over 30 Minutes Intravenous Every 12 hours 07/08/22 1154 07/13/22 1030   07/05/22 2000  ceFAZolin (ANCEF) IVPB 2g/100 mL premix  Status:  Discontinued        2 g 200 mL/hr over 30 Minutes Intravenous Every 12 hours 07/05/22 0958 07/05/22 1004   07/05/22 1530  ceFAZolin (ANCEF) IVPB 2g/100 mL premix  Status:  Discontinued        2 g 200 mL/hr over 30 Minutes Intravenous Every 12 hours 07/05/22 1004 07/08/22 1145   07/04/22 0000  cefTRIAXone (ROCEPHIN) 1 g in sodium chloride 0.9 % 100 mL IVPB  Status:  Discontinued        1 g 200 mL/hr over 30 Minutes  Intravenous Every 24 hours 07/03/22 0211 07/03/22 1153   07/03/22 1600  cefTRIAXone (ROCEPHIN) 2 g in sodium chloride 0.9 % 100 mL IVPB  Status:  Discontinued        2 g 200 mL/hr over 30 Minutes Intravenous Every 24 hours 07/03/22 1153 07/05/22 0958   07/03/22 0215  cefTRIAXone (ROCEPHIN) 1 g in sodium chloride 0.9 % 100 mL IVPB        1 g 200 mL/hr over 30 Minutes Intravenous  Once 07/03/22 0212 07/03/22 0243   07/03/22 0145  vancomycin (VANCOREADY) IVPB 1250 mg/250 mL  Status:  Discontinued        1,250 mg 166.7 mL/hr over 90 Minutes Intravenous  Once 07/03/22 0132 07/03/22 0210   07/03/22 0130  ceFEPIme (MAXIPIME) 2 g in sodium chloride  0.9 % 100 mL IVPB  Status:  Discontinued        2 g 200 mL/hr over 30 Minutes Intravenous  Once 07/03/22 0124 07/03/22 0210   07/03/22 0130  metroNIDAZOLE (FLAGYL) IVPB 500 mg  Status:  Discontinued        500 mg 100 mL/hr over 60 Minutes Intravenous  Once 07/03/22 0124 07/03/22 0210   07/03/22 0130  vancomycin (VANCOCIN) IVPB 1000 mg/200 mL premix  Status:  Discontinued        1,000 mg 200 mL/hr over 60 Minutes Intravenous  Once 07/03/22 0124 07/03/22 0130      Subjective: Patient was seen and examined at bedside. Overnight events noted. Patient was lying comfortably in the bed.  Patient's kidney functions are slowly improving. Son at bedside helping patient to have physical therapy.  Objective: Vitals:   07/22/22 1929 07/22/22 2338 07/23/22 0311 07/23/22 0807  BP: (!) 119/55 126/64 (!) 139/55 (!) 141/61  Pulse: 77 76 78 87  Resp: 20 20 (!) 21 20  Temp: 98.3 F (36.8 C) 98.4 F (36.9 C) 98.3 F (36.8 C) 98.4 F (36.9 C)  TempSrc: Oral Oral Oral Oral  SpO2: 93% 91% 90% 92%  Weight:      Height:       No intake or output data in the 24 hours ending 07/23/22 1109  Filed Weights   07/16/22 0500 07/18/22 0428 07/21/22 0424  Weight: 75.5 kg 75.8 kg 72.1 kg    Examination:  General exam: Appears comfortable, calm, deconditioned, not in any distress. Respiratory system: CTA bilaterally, respiratory effort normal, RR 16. Cardiovascular system: S1 & S2 heard, regular rate and rhythm, no murmur. Gastrointestinal system: Abdomen is soft, non tender, non distended, BS+ Central nervous system: Alert and oriented X 3. No focal neurological deficits. Extremities: No Edema, no cyanosis, no clubbing Skin: No rashes, lesions or ulcers Psychiatry: Judgement and insight appear normal. Mood & affect appropriate.     Data Reviewed: I have personally reviewed following labs and imaging studies  CBC: Recent Labs  Lab 07/17/22 0300 07/18/22 0337 07/19/22 0237 07/19/22 2134  07/20/22 0423 07/22/22 0247 07/23/22 0327  WBC 11.8* 11.6* 10.8*  --  11.1* 10.7* 8.3  NEUTROABS 6.4 6.5 6.0  --  6.7  --   --   HGB 7.3* 7.1* 7.0* 7.5* 7.6* 7.7* 7.9*  HCT 22.2* 21.4* 20.5* 22.0* 22.4* 23.6* 24.1*  MCV 93.7 93.0 92.8  --  92.2 94.4 93.1  PLT 354 327 285  --  272 243 [id number]   Basic Metabolic Panel: Recent Labs  Lab 07/19/22 0237 07/20/22 0423 07/21/22 0539 07/22/22 0247 07/23/22 0645  NA 137 135 137 136 137  K  3.3* 3.2* 3.8 3.7 3.8  CL 103 103 102 105 103  CO2 23 22 22  21* 22  GLUCOSE 97 100* 104* 121* 107*  BUN 56* 61* 60* 63* 63*  CREATININE 3.98* 4.03* 4.17* 3.89* 3.95*  CALCIUM 9.3 8.9 9.1 8.8* 9.1  MG  --   --   --  1.3* 1.8  PHOS  --   --   --  4.4 4.8*   GFR: Estimated Creatinine Clearance: 10.1 mL/min (A) (by C-G formula based on SCr of 3.95 mg/dL (H)). Liver Function Tests: Recent Labs  Lab 07/19/22 0237 07/20/22 0423 07/21/22 0539  ALBUMIN 2.8* 2.8* 2.8*   No results for input(s): "LIPASE", "AMYLASE" in the last 168 hours. No results for input(s): "AMMONIA" in the last 168 hours. Coagulation Profile: No results for input(s): "INR", "PROTIME" in the last 168 hours. Cardiac Enzymes: No results for input(s): "CKTOTAL", "CKMB", "CKMBINDEX", "TROPONINI" in the last 168 hours. BNP (last 3 results) No results for input(s): "PROBNP" in the last 8760 hours. HbA1C: No results for input(s): "HGBA1C" in the last 72 hours. CBG: No results for input(s): "GLUCAP" in the last 168 hours. Lipid Profile: No results for input(s): "CHOL", "HDL", "LDLCALC", "TRIG", "CHOLHDL", "LDLDIRECT" in the last 72 hours. Thyroid Function Tests: No results for input(s): "TSH", "T4TOTAL", "FREET4", "T3FREE", "THYROIDAB" in the last 72 hours. Anemia Panel: No results for input(s): "VITAMINB12", "FOLATE", "FERRITIN", "TIBC", "IRON", "RETICCTPCT" in the last 72 hours. Sepsis Labs: No results for input(s): "PROCALCITON", "LATICACIDVEN" in the last 168 hours.   No results  found for this or any previous visit (from the past 240 hour(s)).   Radiology Studies: No results found.  Scheduled Meds:  acidophilus  1 capsule Oral TID   apixaban  2.5 mg Oral BID   atorvastatin  80 mg Oral Daily   carvedilol  6.25 mg Oral BID WC   levothyroxine  88 mcg Oral QAC breakfast   lidocaine  1 patch Transdermal Q24H   pantoprazole  40 mg Oral BID   QUEtiapine  25 mg Oral Q24H   triamcinolone  1 spray Each Nare Daily   Continuous Infusions:     LOS: 20 days    Time spent: 35 mins    Duard Brady, MD [facility]   If 7PM-7AM, please contact night-coverage

## 2022-07-24 ENCOUNTER — Ambulatory Visit: Payer: Medicare PPO | Admitting: Physician Assistant

## 2022-07-24 DIAGNOSIS — G9341 Metabolic encephalopathy: Secondary | ICD-10-CM | POA: Diagnosis not present

## 2022-07-24 DIAGNOSIS — N10 Acute tubulo-interstitial nephritis: Secondary | ICD-10-CM | POA: Diagnosis not present

## 2022-07-24 DIAGNOSIS — E871 Hypo-osmolality and hyponatremia: Secondary | ICD-10-CM | POA: Diagnosis not present

## 2022-07-24 DIAGNOSIS — E782 Mixed hyperlipidemia: Secondary | ICD-10-CM

## 2022-07-24 DIAGNOSIS — K922 Gastrointestinal hemorrhage, unspecified: Secondary | ICD-10-CM

## 2022-07-24 DIAGNOSIS — N179 Acute kidney failure, unspecified: Secondary | ICD-10-CM | POA: Diagnosis not present

## 2022-07-24 DIAGNOSIS — Z951 Presence of aortocoronary bypass graft: Secondary | ICD-10-CM

## 2022-07-24 LAB — RENAL FUNCTION PANEL
Albumin: 3 g/dL — ABNORMAL LOW (ref 3.5–5.0)
Anion gap: 14 (ref 5–15)
BUN: 65 mg/dL — ABNORMAL HIGH (ref 8–23)
CO2: 21 mmol/L — ABNORMAL LOW (ref 22–32)
Calcium: 9.3 mg/dL (ref 8.9–10.3)
Chloride: 103 mmol/L (ref 98–111)
Creatinine, Ser: 3.84 mg/dL — ABNORMAL HIGH (ref 0.44–1.00)
GFR, Estimated: 11 mL/min — ABNORMAL LOW (ref >60)
Glucose, Bld: 132 mg/dL — ABNORMAL HIGH (ref 70–99)
Phosphorus: 4.9 mg/dL — ABNORMAL HIGH (ref 2.5–4.6)
Potassium: 4.3 mmol/L (ref 3.5–5.1)
Sodium: 138 mmol/L (ref 135–145)

## 2022-07-24 NOTE — Consult Note (Signed)
   [facility] CM Inpatient Consult   07/24/2022  [name] 1940-05-09 [id number]  Follow up:  LLOS 21 days  [facility] [ACO] Patient: [name]  Primary Care Provider:  Cassandria Anger, MD, [place] at [facility]   Patient is currently active with [facility] for chronic disease management services.  Patient has been engaged by a [place].  Our community based plan of care has focused on disease management and community resource support.    Patient's electronic medical record was briefly reviewed for disposition needs and patient is recommended for a skilled nursing facility level of care when medically ready. P2P denied per inpatient TOC LCSW note review.    Plan: Follow   Of note, [facility] Care Management services does not replace or interfere with any services that are needed or arranged by inpatient [facility] care management team.   For additional questions or referrals please contact:  Natividad Brood, RN BSN [place]  [phone] business mobile phone Toll free office [phone]  *[place]  [phone] Fax number: [phone] [email or web address]

## 2022-07-24 NOTE — Progress Notes (Signed)
Mobility Specialist Progress Note   07/24/22 1110  Mobility  Activity Ambulated with assistance to bathroom  Level of Assistance Contact guard assist, steadying assist  Assistive Device Front wheel walker  Distance Ambulated (ft) 16 ft  Activity Response Tolerated well  Mobility Referral Yes  $Mobility charge 1 Mobility   Pt requesting assistance to get from chair to BR d/t BM urgency. Required CGA to stand and ambulate to BR, no faults during transfer. Pt left in BR, RN made aware.  Holland Falling Mobility Specialist Please contact via SecureChat or  Rehab office at [phone]

## 2022-07-24 NOTE — Progress Notes (Signed)
Physical Therapy Treatment Patient Details Name: [name] MRN: [id number] DOB: 1941-03-02 Today's Date: 07/24/2022   History of Present Illness 82 y.o. female presents to [facility] hospital on 07/02/2022 with worsening weakness. Pt admitted for management of acute cystitis. PMH includes CKD IV, HTN, RA, hypothyroidism, CABG, COPD, HLD, PE.    PT Comments    Pt greeted up in room with NT and agreeable to session with continued progress towards acute goals. Pt continues to require min A during functional transfers to power up to stand and steady during step pivot to chair. Pt requiring min guard with RW support throughout gait as pt fatigues quickly with instability increasing as pt fatigues. Pt requiring total assist to don compression socks and for peri-care in standing. Pt able to complete x2 sustained piriformis stretch in sitting with assist to bring RLE to figure 4 position with pt endorsing improvement in symptoms. Placed pillow under R hip to off load painful low back/glute as pt with tendency for R lateral lean in sitting, and educated pt re; pressure relief and benefits with pt verbalizing understanding. Pt continues to benefit from skilled PT services to progress toward functional mobility goals.    Recommendations for follow up therapy are one component of a multi-disciplinary discharge planning process, led by the attending physician.  Recommendations may be updated based on patient status, additional functional criteria and insurance authorization.  Follow Up Recommendations  Can patient physically be transported by private vehicle: Yes    Assistance Recommended at Discharge Intermittent Supervision/Assistance  Patient can return home with the following Help with stairs or ramp for entrance;Assist for transportation;Assistance with cooking/housework   Equipment Recommendations  None recommended by PT    Recommendations for Other Services       Precautions / Restrictions  Precautions Precautions: Fall;Other (comment) Precaution Comments: watch 02/RR Restrictions Weight Bearing Restrictions: No     Mobility  Bed Mobility Overal bed mobility: Needs Assistance             General bed mobility comments: pt OOB on arrival    Transfers Overall transfer level: Needs assistance Equipment used: Rolling walker (2 wheels) Transfers: Sit to/from Stand, Bed to chair/wheelchair/BSC Sit to Stand: Min assist   Step pivot transfers: Min assist       General transfer comment: light min A to power up, min A to steady to step pivot chair<>BSC    Ambulation/Gait Ambulation/Gait assistance: Min guard Gait Distance (Feet): 225 Feet Assistive device: Rolling walker (2 wheels) Gait Pattern/deviations: Step-through pattern, Decreased stride length, Trunk flexed Gait velocity: decr     General Gait Details: cues for upright posture and proximity to RW. distance limited due to fatigue, SPO2 92% and greater on RA   Stairs             Wheelchair Mobility    Modified Rankin (Stroke Patients Only)       Balance Overall balance assessment: Needs assistance Sitting-balance support: Feet supported, No upper extremity supported Sitting balance-Leahy Scale: Good Sitting balance - Comments: stable sitting balance but noted R lateral lean Postural control: Right lateral lean Standing balance support: During functional activity Standing balance-Leahy Scale: Fair Standing balance comment: reliant on UE support, some R lateral trunk flexion in standing                            Cognition Arousal/Alertness: Awake/alert Behavior During Therapy: WFL for tasks assessed/performed Overall Cognitive Status: Impaired/Different from baseline  Area of Impairment: Following commands, Awareness, Problem solving                   Current Attention Level: Alternating, Selective   Following Commands: Follows one step commands consistently    Awareness: Emergent Problem Solving: Slow processing, Decreased initiation General Comments: slow processing throughout. More flat in affect today with intermittent expression of emotion        Exercises Other Exercises Other Exercises: seated figure 4 piriformis stretch x2, pt endorsing improvement in symptoms, placed pillow under R hip in sitting to offload as pt with tendency for R lean in sitting    General Comments General comments (skin integrity, edema, etc.): pt dependent for peri-care in standing, total A to don compression socks      Pertinent Vitals/Pain Pain Assessment Pain Assessment: Faces Faces Pain Scale: Hurts even more Pain Location: low back R>L Pain Descriptors / Indicators: Guarding, Sore Pain Intervention(s): Monitored during session, Limited activity within patient's tolerance, Other (comment) (stretches)    Home Living                          Prior Function            PT Goals (current goals can now be found in the care plan section) Acute Rehab PT Goals PT Goal Formulation: With patient Time For Goal Achievement: 07/31/22 Progress towards PT goals: Progressing toward goals    Frequency    Min 3X/week      PT Plan Current plan remains appropriate    Co-evaluation              AM-PAC PT "6 Clicks" Mobility   Outcome Measure  Help needed turning from your back to your side while in a flat bed without using bedrails?: A Little Help needed moving from lying on your back to sitting on the side of a flat bed without using bedrails?: A Little Help needed moving to and from a bed to a chair (including a wheelchair)?: A Little Help needed standing up from a chair using your arms (e.g., wheelchair or bedside chair)?: A Little Help needed to walk in hospital room?: A Little Help needed climbing 3-5 steps with a railing? : A Little 6 Click Score: 18    End of Session Equipment Utilized During Treatment: Gait belt Activity  Tolerance: Patient limited by fatigue;Patient limited by pain Patient left: in chair;with call bell/phone within reach;with family/visitor present Nurse Communication: Mobility status PT Visit Diagnosis: Other abnormalities of gait and mobility (R26.89);Muscle weakness (generalized) (M62.81);Repeated falls (R29.6)     Time: [id number] PT Time Calculation (min) (ACUTE ONLY): 31 min  Charges:  $Gait Training: 8-22 mins $Therapeutic Exercise: 8-22 mins                     Sanai Frick R. PTA Acute Rehabilitation Services Office: [place] 07/24/2022, 10:04 AM

## 2022-07-24 NOTE — Progress Notes (Addendum)
PROGRESS NOTE    [name]  [id number] DOB: 11/04/1940 DOA: 07/02/2022 PCP: Cassandria Anger, MD    Brief Narrative: Patient is a 82 y.o. female with  history of CKD stage IV, hypertension, rheumatoid arthritis, hypothyroidism, CAD s/p CABG, COPD, hyperlipidemia, DVT/PE presented hospital with generalized weakness fever.  Patient was noted to have sepsis secondary to UTI and empiric antibiotics.  Initiated.  Patient also had hyponatremia and dehydration and acute kidney injury.  She was thought to have acute pyelonephritis and E. coli bacteremia was noted.  She has completed antibiotic course but hospital course was complicated due to melena she underwent endoscopic evaluation with findings of gastric and duodenal ulcers.  Eliquis was discontinued initially which was subsequently resumed but then again started having anemia.  Currently patient is volume overloaded with rising creatinine levels.  Nephrology managing acute kidney injury and volume status.  Physical therapy has recommended skilled nursing facility placement.  Assessment & Plan:   Principal Problem:   Acute pyelonephritis Active Problems:   Acute renal failure superimposed on stage 4 chronic kidney disease (HCC)   Acute metabolic encephalopathy   Hyponatremia   Sepsis with acute renal failure (HCC)   Acquired hypothyroidism   Essential hypertension   Rheumatoid arthritis (HCC)   Anticoagulated   Hx of CABG   Hyperlipidemia   COPD (chronic obstructive pulmonary disease) (HCC)   CKD (chronic kidney disease) stage 4, GFR 15-29 ml/min (HCC) - baseline SCr 2.5. follows with Dr. Royce Macadamia with [facility]   History of pulmonary embolism   Upper GI bleed   Melena   Metabolic acidosis, increased anion gap   Generalized weakness   DNR (do not resuscitate)/DNI(Do Not Intubate)   E coli bacteremia   Dark stools   Anemia   Acute gastric ulcer with hemorrhage   Duodenal ulcer  Sepsis secondary to acute hemorrhagic  cystitis/Right pyelonephritis with E. coli bacteremia : Patient has completed 10-day course of antibiotic initially with Rocephin followed by cefazolin and Unasyn.  Sepsis physiology has resolved  Acute blood loss anemia secondary to bleeding gastric ulcer/duodenal ulcers: Patient underwent EGD 07/08/2022 with findings of gastric and duodenal ulcer without any active bleeding.  Anticoagulation was on hold for few days and subsequently was reinitiated on 07/14/2022 but her hemoglobin continued to drop to 7.0 on 07/19/2022.  1 unit of packed RBC was given.    Continue Protonix.  Plan is to continue twice daily for 6 weeks followed by once daily.  Hemoglobin today at 7.9 from 7.7 yesterday and has remained stable currently.  Eliquis has been reinitiated 07/22/2022.  Hyponatremia: > Resolved. Thought to be secondary to hypovolemia and dehydration.  Improved and resolved with IV hydration.  Latest sodium of 137.   Acute metabolic encephalopathy : > Resolved. At baseline.  Likely multifactorial from dehydration hyponatremia bacteremia UTI.   AKI on CKD stage IV: Metabolic acidosis, increased anion gap / fluid overload: Patient's baseline creatinine around 2.5.  Follows up with Dr. Lavone Neri kidney.  Patient had creatinine of 4.7 on presentation.  Creatinine today at 3.8 from 3.9 yesterday.  Thought to be secondary to poor oral intake with continued diuretic usage.  Renal ultrasound was negative for hydronephrosis.  Creatinine initially plateaued but then started trending up so nephrology is following.  Essential hypertension: Continue Coreg and amlodipine.  On reduced dose of amlodipine to 5 mg as per nephrology.  Hypokalemia Resolved.  Latest potassium of 3.8.   History of pulmonary embolism on long-term anticoagulants:  On a reduced dose regimen at 2.5 mg BID as an outpatient; possibly related to history of GI bleeding.  Currently Eliquis was initiated starting 07/22/2022.    COPD (chronic  obstructive pulmonary disease) (HCC) Stable. Asymptomatic.  Continue bronchodilators.   Hyperlipidemia Continue Lipitor    Hx of CABG Stable.  Continue Coreg.   Rheumatoid arthritis ([place]) Patient is on [place] as an outpatient which is held secondary to active infection.   Acquired hypothyroidism Continue Synthroid    Generalized weakness Seen by physical therapy occupational therapy and recommended skilled nursing facility on discharge.  Acute hypoxic respiratory failure: Mostly while lying down.  On 1 to 2 L.  Has been advised incentive spirometry.  Chest x-ray with atelectasis.  Will continue to wean as able.     DVT prophylaxis: Eliquis.  Code Status: DNR  Family Communication: Spoke with the patient's son at bedside at length.  Disposition Plan: Skilled nursing facility when okay with nephrology.  Status is: Inpatient  Remains inpatient appropriate because: Elevated creatinine, fluid overload, nephrology managing.  Consultants:  Nephrology  Procedures:  EGD PRBC transfusion 1 unit.  Antimicrobials: Anti-infectives (From admission, onward)    Start     Dose/Rate Route Frequency Ordered Stop   07/08/22 1245  ampicillin-sulbactam (UNASYN) 1.5 g in sodium chloride 0.9 % 100 mL IVPB        1.5 g 200 mL/hr over 30 Minutes Intravenous Every 12 hours 07/08/22 1154 07/13/22 1030   07/05/22 2000  ceFAZolin (ANCEF) IVPB 2g/100 mL premix  Status:  Discontinued        2 g 200 mL/hr over 30 Minutes Intravenous Every 12 hours 07/05/22 0958 07/05/22 1004   07/05/22 1530  ceFAZolin (ANCEF) IVPB 2g/100 mL premix  Status:  Discontinued        2 g 200 mL/hr over 30 Minutes Intravenous Every 12 hours 07/05/22 1004 07/08/22 1145   07/04/22 0000  cefTRIAXone (ROCEPHIN) 1 g in sodium chloride 0.9 % 100 mL IVPB  Status:  Discontinued        1 g 200 mL/hr over 30 Minutes Intravenous Every 24 hours 07/03/22 0211 07/03/22 1153   07/03/22 1600  cefTRIAXone (ROCEPHIN) 2 g in sodium  chloride 0.9 % 100 mL IVPB  Status:  Discontinued        2 g 200 mL/hr over 30 Minutes Intravenous Every 24 hours 07/03/22 1153 07/05/22 0958   07/03/22 0215  cefTRIAXone (ROCEPHIN) 1 g in sodium chloride 0.9 % 100 mL IVPB        1 g 200 mL/hr over 30 Minutes Intravenous  Once 07/03/22 0212 07/03/22 0243   07/03/22 0145  vancomycin (VANCOREADY) IVPB 1250 mg/250 mL  Status:  Discontinued        1,250 mg 166.7 mL/hr over 90 Minutes Intravenous  Once 07/03/22 0132 07/03/22 0210   07/03/22 0130  ceFEPIme (MAXIPIME) 2 g in sodium chloride 0.9 % 100 mL IVPB  Status:  Discontinued        2 g 200 mL/hr over 30 Minutes Intravenous  Once 07/03/22 0124 07/03/22 0210   07/03/22 0130  metroNIDAZOLE (FLAGYL) IVPB 500 mg  Status:  Discontinued        500 mg 100 mL/hr over 60 Minutes Intravenous  Once 07/03/22 0124 07/03/22 0210   07/03/22 0130  vancomycin (VANCOCIN) IVPB 1000 mg/200 mL premix  Status:  Discontinued        1,000 mg 200 mL/hr over 60 Minutes Intravenous  Once 07/03/22 0124 07/03/22 0130  Subjective: Today, patient was seen and examined at bedside.  Patient did walk some yesterday.  Complains of lower extremity swelling but improving.  Denies any nausea vomiting shortness of breath or chest pain.  Patient's son at bedside.  Objective: Vitals:   07/23/22 1939 07/23/22 2312 07/24/22 0400 07/24/22 0500  BP: (!) 125/42 (!) 130/47 (!) 148/49   Pulse: 73 85 80   Resp: 20 20 20    Temp: 98.4 F (36.9 C) 98.3 F (36.8 C) 98.2 F (36.8 C)   TempSrc: Oral Oral Oral   SpO2:  90% 94%   Weight:    72.6 kg  Height:        Intake/Output Summary (Last 24 hours) at 07/24/2022 0721 Last data filed at 07/24/2022 [id number] Gross per 24 hour  Intake 240 ml  Output --  Net 240 ml    Filed Weights   07/18/22 0428 07/21/22 0424 07/24/22 0500  Weight: 75.8 kg 72.1 kg 72.6 kg    Physical examination:  General:  Average built, not in obvious distress, appears deconditioned, elderly female,  Communicative HENT:   No scleral pallor or icterus noted. Oral mucosa is moist.  Chest:   Diminished breath sounds bilaterally. No crackles or wheezes.  CVS: S1 &S2 heard. No murmur.  Regular rate and rhythm. Abdomen: Soft, nontender, nondistended.  Bowel sounds are heard.   Extremities: No cyanosis, clubbing with bilateral lower extremity pitting edema.  Peripheral pulses are palpable. Psych: Alert, awake and oriented, normal mood CNS:  No cranial nerve deficits.  Power equal in all extremities.   Skin: Warm and dry.  No rashes noted.   Data Reviewed: I have personally reviewed following labs and imaging studies  CBC: Recent Labs  Lab 07/18/22 0337 07/19/22 0237 07/19/22 2134 07/20/22 0423 07/22/22 0247 07/23/22 0327  WBC 11.6* 10.8*  --  11.1* 10.7* 8.3  NEUTROABS 6.5 6.0  --  6.7  --   --   HGB 7.1* 7.0* 7.5* 7.6* 7.7* 7.9*  HCT 21.4* 20.5* 22.0* 22.4* 23.6* 24.1*  MCV 93.0 92.8  --  92.2 94.4 93.1  PLT 327 285  --  272 243 [id number]    Basic Metabolic Panel: Recent Labs  Lab 07/19/22 0237 07/20/22 0423 07/21/22 0539 07/22/22 0247 07/23/22 0645  NA 137 135 137 136 137  K 3.3* 3.2* 3.8 3.7 3.8  CL 103 103 102 105 103  CO2 23 22 22  21* 22  GLUCOSE 97 100* 104* 121* 107*  BUN 56* 61* 60* 63* 63*  CREATININE 3.98* 4.03* 4.17* 3.89* 3.95*  CALCIUM 9.3 8.9 9.1 8.8* 9.1  MG  --   --   --  1.3* 1.8  PHOS  --   --   --  4.4 4.8*    GFR: Estimated Creatinine Clearance: 10.1 mL/min (A) (by C-G formula based on SCr of 3.95 mg/dL (H)). Liver Function Tests: Recent Labs  Lab 07/19/22 0237 07/20/22 0423 07/21/22 0539  ALBUMIN 2.8* 2.8* 2.8*    No results for input(s): "LIPASE", "AMYLASE" in the last 168 hours. No results for input(s): "AMMONIA" in the last 168 hours. Coagulation Profile: No results for input(s): "INR", "PROTIME" in the last 168 hours. Cardiac Enzymes: No results for input(s): "CKTOTAL", "CKMB", "CKMBINDEX", "TROPONINI" in the last 168 hours. BNP (last  3 results) No results for input(s): "PROBNP" in the last 8760 hours. HbA1C: No results for input(s): "HGBA1C" in the last 72 hours. CBG: No results for input(s): "GLUCAP" in the last 168 hours. Lipid Profile:  No results for input(s): "CHOL", "HDL", "LDLCALC", "TRIG", "CHOLHDL", "LDLDIRECT" in the last 72 hours. Thyroid Function Tests: No results for input(s): "TSH", "T4TOTAL", "FREET4", "T3FREE", "THYROIDAB" in the last 72 hours. Anemia Panel: No results for input(s): "VITAMINB12", "FOLATE", "FERRITIN", "TIBC", "IRON", "RETICCTPCT" in the last 72 hours. Sepsis Labs: No results for input(s): "PROCALCITON", "LATICACIDVEN" in the last 168 hours.   No results found for this or any previous visit (from the past 240 hour(s)).   Radiology Studies: No results found.  Scheduled Meds:  acidophilus  1 capsule Oral TID   apixaban  2.5 mg Oral BID   atorvastatin  80 mg Oral Daily   carvedilol  6.25 mg Oral BID WC   levothyroxine  88 mcg Oral QAC breakfast   lidocaine  1 patch Transdermal Q24H   pantoprazole  40 mg Oral BID   QUEtiapine  25 mg Oral Q24H   triamcinolone  1 spray Each Nare Daily   Continuous Infusions:    LOS: 21 days    Flora Lipps, MD [facility] If 7PM-7AM, please contact night-coverage

## 2022-07-24 NOTE — Progress Notes (Signed)
Patient ID: [name], female   DOB: 30-Sep-1940, 82 y.o.   MRN: [id number] [facility] Progress Note   Assessment/ Plan:   1. Acute kidney Injury on chronic kidney disease stage IV: Baseline creatinine around 2.2.  Suspected to have had multifactorial acute kidney injury resulting in ATN; she apparently has "good urine output" based on her report but not charted.  Labs ordered for this morning to decide on ability to discharge her to the SNF; would be comfortable discharging her with downtrending creatinine preferably <3.6.  She does not have any acute electrolyte abnormality and is hypervolemic but prudent to hold diuretics at this time. 2.  Hypertension: Blood pressures fairly controlled on carvedilol status post discontinuation of amlodipine. 3.  Status post E. coli pyelonephritis/bacteremia with sepsis: Status post completion of intravenous antibiotic course and resolution of sepsis markers. 4.  Deconditioning: Status post prolonged hospitalization/acute illness, plans noted for discharge to SNF. 5.  Acute blood loss anemia: With recent acute upper GI bleed secondary to GU/DU.  Eliquis transiently held with ongoing treatment with PPI.  Subjective:   Reports to be feeling fair this morning, walked around hallways with assistance.   Objective:   BP (!) 136/51 (BP Location: Left Arm)   Pulse 81   Temp 98.1 F (36.7 C) (Oral)   Resp 19   Ht 5\' 3"  (1.6 m)   Wt 72.6 kg   SpO2 91%   BMI 28.35 kg/m   Intake/Output Summary (Last 24 hours) at 07/24/2022 1001 Last data filed at 07/24/2022 [id number] Gross per 24 hour  Intake 240 ml  Output --  Net 240 ml   Weight change:   Physical Exam: Gen: Sitting up in recliner, son at bedside CVS: Pulse regular rhythm, normal rate, S1 and S2 normal Resp: Diminished breath sounds over bases, no distinct rales or rhonchi Abd: Soft, flat, nontender, bowel sounds normal Ext: 1-2+ lower extremity edema bilaterally.  Compression stockings in  place.  Imaging: No results found.  Labs: BMET Recent Labs  Lab 07/18/22 0337 07/19/22 0237 07/20/22 0423 07/21/22 0539 07/22/22 0247 07/23/22 0645  NA 137 137 135 137 136 137  K 4.2 3.3* 3.2* 3.8 3.7 3.8  CL 103 103 103 102 105 103  CO2 21* 23 22 22  21* 22  GLUCOSE 93 97 100* 104* 121* 107*  BUN 54* 56* 61* 60* 63* 63*  CREATININE 3.79* 3.98* 4.03* 4.17* 3.89* 3.95*  CALCIUM 9.3 9.3 8.9 9.1 8.8* 9.1  PHOS  --   --   --   --  4.4 4.8*   CBC Recent Labs  Lab 07/18/22 0337 07/19/22 0237 07/19/22 2134 07/20/22 0423 07/22/22 0247 07/23/22 0327  WBC 11.6* 10.8*  --  11.1* 10.7* 8.3  NEUTROABS 6.5 6.0  --  6.7  --   --   HGB 7.1* 7.0* 7.5* 7.6* 7.7* 7.9*  HCT 21.4* 20.5* 22.0* 22.4* 23.6* 24.1*  MCV 93.0 92.8  --  92.2 94.4 93.1  PLT 327 285  --  272 243 220    Medications:     acidophilus  1 capsule Oral TID   apixaban  2.5 mg Oral BID   atorvastatin  80 mg Oral Daily   carvedilol  6.25 mg Oral BID WC   levothyroxine  88 mcg Oral QAC breakfast   lidocaine  1 patch Transdermal Q24H   pantoprazole  40 mg Oral BID   QUEtiapine  25 mg Oral Q24H   triamcinolone  1 spray Each Nare  Daily   Elmarie Shiley, MD 07/24/2022, 10:01 AM

## 2022-07-24 NOTE — TOC Progression Note (Addendum)
Transition of Care [facility]) - Progression Note    Patient Details  Name: [name] MRN: [id number] Date of Birth: [birth year]  Transition of Care [facility]) CM/SW [place], LCSW Phone Number: 07/24/2022, 9:53 AM  Clinical Narrative:    9:53am-Peer to peer being offered by insurance, deadline is 2pm today, the number to call is [phone], option #5. ID# [id number]. Info provided to MD.   1pm-Peer to peer completed and denied due to medical instability. CSW will attempt again once medically stable. CSW updated Katie with [place].    Expected Discharge Plan: [place] Barriers to Discharge: Continued Medical Work up, Ship broker  Expected Discharge Plan and Services In-house Referral: Clinical Social Work     Living arrangements for the past 2 months: [place]                                       Social Determinants of Health (SDOH) Interventions SDOH Screenings   Food Insecurity: No Food Insecurity (06/13/2022)  Housing: Low Risk  (06/13/2022)  Transportation Needs: No Transportation Needs (06/13/2022)  Utilities: Not At Risk (06/13/2022)  Alcohol Screen: Low Risk  (05/30/2021)  Depression (PHQ2-9): Low Risk  (06/15/2022)  Financial Resource Strain: Low Risk  (06/13/2022)  Physical Activity: Inactive (06/13/2022)  Social Connections: Unknown (06/13/2022)  Stress: No Stress Concern Present (06/13/2022)  Tobacco Use: Medium Risk (07/11/2022)    Readmission Risk Interventions    07/05/2022    9:42 AM  Readmission Risk Prevention Plan  Transportation Screening Complete  Medication Review (RN Care Manager) Complete  PCP or Specialist appointment within 3-5 days of discharge Complete  HRI or [place] Complete  SW Recovery Care/Counseling Consult Complete  Palliative Care Screening Not Applicable  Skilled Nursing Facility Complete

## 2022-07-25 DIAGNOSIS — R6520 Severe sepsis without septic shock: Secondary | ICD-10-CM | POA: Diagnosis not present

## 2022-07-25 DIAGNOSIS — R195 Other fecal abnormalities: Secondary | ICD-10-CM | POA: Diagnosis not present

## 2022-07-25 DIAGNOSIS — N184 Chronic kidney disease, stage 4 (severe): Secondary | ICD-10-CM | POA: Diagnosis not present

## 2022-07-25 DIAGNOSIS — E559 Vitamin D deficiency, unspecified: Secondary | ICD-10-CM | POA: Diagnosis not present

## 2022-07-25 DIAGNOSIS — R509 Fever, unspecified: Secondary | ICD-10-CM | POA: Diagnosis not present

## 2022-07-25 DIAGNOSIS — E039 Hypothyroidism, unspecified: Secondary | ICD-10-CM | POA: Diagnosis not present

## 2022-07-25 DIAGNOSIS — R609 Edema, unspecified: Secondary | ICD-10-CM | POA: Diagnosis not present

## 2022-07-25 DIAGNOSIS — S022XXA Fracture of nasal bones, initial encounter for closed fracture: Secondary | ICD-10-CM | POA: Diagnosis not present

## 2022-07-25 DIAGNOSIS — D649 Anemia, unspecified: Secondary | ICD-10-CM | POA: Diagnosis not present

## 2022-07-25 DIAGNOSIS — Z86718 Personal history of other venous thrombosis and embolism: Secondary | ICD-10-CM | POA: Diagnosis not present

## 2022-07-25 DIAGNOSIS — K5901 Slow transit constipation: Secondary | ICD-10-CM | POA: Diagnosis not present

## 2022-07-25 DIAGNOSIS — J449 Chronic obstructive pulmonary disease, unspecified: Secondary | ICD-10-CM | POA: Diagnosis not present

## 2022-07-25 DIAGNOSIS — M25461 Effusion, right knee: Secondary | ICD-10-CM | POA: Diagnosis not present

## 2022-07-25 DIAGNOSIS — Z9889 Other specified postprocedural states: Secondary | ICD-10-CM | POA: Diagnosis not present

## 2022-07-25 DIAGNOSIS — G4733 Obstructive sleep apnea (adult) (pediatric): Secondary | ICD-10-CM | POA: Diagnosis not present

## 2022-07-25 DIAGNOSIS — A419 Sepsis, unspecified organism: Secondary | ICD-10-CM | POA: Diagnosis not present

## 2022-07-25 DIAGNOSIS — N170 Acute kidney failure with tubular necrosis: Secondary | ICD-10-CM | POA: Diagnosis not present

## 2022-07-25 DIAGNOSIS — J432 Centrilobular emphysema: Secondary | ICD-10-CM | POA: Diagnosis not present

## 2022-07-25 DIAGNOSIS — I2510 Atherosclerotic heart disease of native coronary artery without angina pectoris: Secondary | ICD-10-CM | POA: Diagnosis not present

## 2022-07-25 DIAGNOSIS — N179 Acute kidney failure, unspecified: Secondary | ICD-10-CM | POA: Diagnosis not present

## 2022-07-25 DIAGNOSIS — S0083XA Contusion of other part of head, initial encounter: Secondary | ICD-10-CM | POA: Diagnosis not present

## 2022-07-25 DIAGNOSIS — R531 Weakness: Secondary | ICD-10-CM | POA: Diagnosis not present

## 2022-07-25 DIAGNOSIS — N10 Acute tubulo-interstitial nephritis: Secondary | ICD-10-CM | POA: Diagnosis not present

## 2022-07-25 DIAGNOSIS — Z043 Encounter for examination and observation following other accident: Secondary | ICD-10-CM | POA: Diagnosis not present

## 2022-07-25 DIAGNOSIS — S022XXD Fracture of nasal bones, subsequent encounter for fracture with routine healing: Secondary | ICD-10-CM | POA: Diagnosis not present

## 2022-07-25 DIAGNOSIS — W01198A Fall on same level from slipping, tripping and stumbling with subsequent striking against other object, initial encounter: Secondary | ICD-10-CM | POA: Diagnosis not present

## 2022-07-25 DIAGNOSIS — E875 Hyperkalemia: Secondary | ICD-10-CM | POA: Diagnosis not present

## 2022-07-25 DIAGNOSIS — I160 Hypertensive urgency: Secondary | ICD-10-CM | POA: Diagnosis not present

## 2022-07-25 DIAGNOSIS — M25561 Pain in right knee: Secondary | ICD-10-CM | POA: Diagnosis not present

## 2022-07-25 DIAGNOSIS — I129 Hypertensive chronic kidney disease with stage 1 through stage 4 chronic kidney disease, or unspecified chronic kidney disease: Secondary | ICD-10-CM | POA: Diagnosis not present

## 2022-07-25 DIAGNOSIS — Z86711 Personal history of pulmonary embolism: Secondary | ICD-10-CM | POA: Diagnosis not present

## 2022-07-25 DIAGNOSIS — R0902 Hypoxemia: Secondary | ICD-10-CM | POA: Diagnosis not present

## 2022-07-25 DIAGNOSIS — S0993XA Unspecified injury of face, initial encounter: Secondary | ICD-10-CM | POA: Diagnosis not present

## 2022-07-25 DIAGNOSIS — S0992XA Unspecified injury of nose, initial encounter: Secondary | ICD-10-CM | POA: Diagnosis present

## 2022-07-25 DIAGNOSIS — M79641 Pain in right hand: Secondary | ICD-10-CM | POA: Diagnosis not present

## 2022-07-25 DIAGNOSIS — E782 Mixed hyperlipidemia: Secondary | ICD-10-CM | POA: Diagnosis not present

## 2022-07-25 DIAGNOSIS — Z7901 Long term (current) use of anticoagulants: Secondary | ICD-10-CM | POA: Diagnosis not present

## 2022-07-25 DIAGNOSIS — Z79899 Other long term (current) drug therapy: Secondary | ICD-10-CM | POA: Diagnosis not present

## 2022-07-25 DIAGNOSIS — E8779 Other fluid overload: Secondary | ICD-10-CM | POA: Diagnosis not present

## 2022-07-25 DIAGNOSIS — Z7401 Bed confinement status: Secondary | ICD-10-CM | POA: Diagnosis not present

## 2022-07-25 DIAGNOSIS — M069 Rheumatoid arthritis, unspecified: Secondary | ICD-10-CM | POA: Diagnosis not present

## 2022-07-25 DIAGNOSIS — S0003XA Contusion of scalp, initial encounter: Secondary | ICD-10-CM | POA: Diagnosis not present

## 2022-07-25 DIAGNOSIS — I10 Essential (primary) hypertension: Secondary | ICD-10-CM | POA: Diagnosis not present

## 2022-07-25 DIAGNOSIS — K219 Gastro-esophageal reflux disease without esophagitis: Secondary | ICD-10-CM | POA: Diagnosis not present

## 2022-07-25 DIAGNOSIS — D631 Anemia in chronic kidney disease: Secondary | ICD-10-CM | POA: Diagnosis not present

## 2022-07-25 DIAGNOSIS — K922 Gastrointestinal hemorrhage, unspecified: Secondary | ICD-10-CM | POA: Diagnosis not present

## 2022-07-25 DIAGNOSIS — I700 Atherosclerosis of aorta: Secondary | ICD-10-CM | POA: Diagnosis not present

## 2022-07-25 DIAGNOSIS — G9341 Metabolic encephalopathy: Secondary | ICD-10-CM | POA: Diagnosis not present

## 2022-07-25 DIAGNOSIS — W19XXXA Unspecified fall, initial encounter: Secondary | ICD-10-CM | POA: Diagnosis not present

## 2022-07-25 LAB — CBC
HCT: 24.8 % — ABNORMAL LOW (ref 36.0–46.0)
Hemoglobin: 8.1 g/dL — ABNORMAL LOW (ref 12.0–15.0)
MCH: 30.9 pg (ref 26.0–34.0)
MCHC: 32.7 g/dL (ref 30.0–36.0)
MCV: 94.7 fL (ref 80.0–100.0)
Platelets: 321 10*3/uL (ref 150–400)
RBC: 2.62 MIL/uL — ABNORMAL LOW (ref 3.87–5.11)
RDW: 17.5 % — ABNORMAL HIGH (ref 11.5–15.5)
WBC: 8.1 10*3/uL (ref 4.0–10.5)
nRBC: 0.2 % (ref 0.0–0.2)

## 2022-07-25 LAB — RENAL FUNCTION PANEL
Albumin: 2.9 g/dL — ABNORMAL LOW (ref 3.5–5.0)
Anion gap: 14 (ref 5–15)
BUN: 62 mg/dL — ABNORMAL HIGH (ref 8–23)
CO2: 21 mmol/L — ABNORMAL LOW (ref 22–32)
Calcium: 9.6 mg/dL (ref 8.9–10.3)
Chloride: 104 mmol/L (ref 98–111)
Creatinine, Ser: 3.71 mg/dL — ABNORMAL HIGH (ref 0.44–1.00)
GFR, Estimated: 12 mL/min — ABNORMAL LOW (ref >60)
Glucose, Bld: 91 mg/dL (ref 70–99)
Phosphorus: 5.4 mg/dL — ABNORMAL HIGH (ref 2.5–4.6)
Potassium: 4 mmol/L (ref 3.5–5.1)
Sodium: 139 mmol/L (ref 135–145)

## 2022-07-25 LAB — MAGNESIUM: Magnesium: 1.9 mg/dL (ref 1.7–2.4)

## 2022-07-25 MED ORDER — PANTOPRAZOLE SODIUM 40 MG PO TBEC: 40.0000 mg | DELAYED_RELEASE_TABLET | Freq: Two times a day (BID) | ORAL | 0 refills | Status: DC

## 2022-07-25 NOTE — TOC Transition Note (Signed)
Transition of Care [facility]) - CM/SW Discharge Note   Patient Details  Name: [name] MRN: [id number] Date of Birth: [birth year]  [facility] CM/SW Contact:  Benard Halsted, LCSW Phone Number: 07/25/2022, 12:47 PM   Clinical Narrative:    CSW met with spouse and son, Shanon Brow, at bedside to discuss discharge today. CSW went over insurance options (to start insurance process again and let [facility] do a peer to peer versus completing an appeal). After discussing with Joellen Jersey at [facility], family has decided to complete a Fast appeal. CSW provided them with the contact info to complete it (p. [phone]. [phone]). Family is requested [facility] for transport.    Final next level of care: Skilled Nursing Facility Barriers to Discharge: Barriers Resolved   Patient Goals and CMS Choice CMS Medicare.gov Compare Post Acute Care list provided to:: Patient Choice offered to / list presented to : Patient, Spouse  Discharge Placement     Existing PASRR number confirmed : 07/25/22          Patient chooses bed at: [facility] Patient to be transferred to facility by: [place] Name of family member notified: Spouse and son Patient and family notified of of transfer: 07/25/22  Discharge Plan and Services Additional resources added to the After Visit Summary for   In-house Referral: Clinical Social Work                                   Social Determinants of Health (SDOH) Interventions SDOH Screenings   Food Insecurity: No Food Insecurity (06/13/2022)  Housing: Low Risk  (06/13/2022)  Transportation Needs: No Transportation Needs (06/13/2022)  Utilities: Not At Risk (06/13/2022)  Alcohol Screen: Low Risk  (05/30/2021)  Depression (PHQ2-9): Low Risk  (06/15/2022)  Financial Resource Strain: Low Risk  (06/13/2022)  Physical Activity: Inactive (06/13/2022)  Social Connections: Unknown (06/13/2022)  Stress: No Stress Concern Present (06/13/2022)  Tobacco  Use: Medium Risk (07/11/2022)     Readmission Risk Interventions    07/05/2022    9:42 AM  Readmission Risk Prevention Plan  Transportation Screening Complete  Medication Review (RN Care Manager) Complete  PCP or Specialist appointment within 3-5 days of discharge Complete  HRI or [place] Complete  SW Recovery Care/Counseling Consult Complete  [place] Complete

## 2022-07-25 NOTE — Progress Notes (Signed)
Mobility Specialist Progress Note   07/25/22 1644  Mobility  Activity Stood at bedside  Level of Assistance Minimal assist, patient does 75% or more  Assistive Device Front wheel walker  Distance Ambulated (ft) 2 ft  Activity Response Tolerated well  Mobility Referral Yes  $Mobility charge 1 Mobility   Assisted pt in ADL's in preparation for d/c.  [place] Mobility Specialist Please contact via SecureChat or  Rehab office at [phone]

## 2022-07-25 NOTE — TOC Progression Note (Signed)
Transition of Care [facility]) - Progression Note    Patient Details  Name: [name] MRN: [id number] Date of Birth: 1941-01-17  Transition of Care [facility]) CM/SW [place], LCSW Phone Number: 07/25/2022, 9:34 AM  Clinical Narrative:    CSW discussed patient's medical stability for discharge today with Katie at [facility]. Will follow up on insurance appeal information for family.    Expected Discharge Plan: [place] Barriers to Discharge: Continued Medical Work up, Ship broker  Expected Discharge Plan and Services In-house Referral: Clinical Social Work     Living arrangements for the past 2 months: [place]                                       Social Determinants of Health (SDOH) Interventions SDOH Screenings   Food Insecurity: No Food Insecurity (06/13/2022)  Housing: Low Risk  (06/13/2022)  Transportation Needs: No Transportation Needs (06/13/2022)  Utilities: Not At Risk (06/13/2022)  Alcohol Screen: Low Risk  (05/30/2021)  Depression (PHQ2-9): Low Risk  (06/15/2022)  Financial Resource Strain: Low Risk  (06/13/2022)  Physical Activity: Inactive (06/13/2022)  Social Connections: Unknown (06/13/2022)  Stress: No Stress Concern Present (06/13/2022)  Tobacco Use: Medium Risk (07/11/2022)    Readmission Risk Interventions    07/05/2022    9:42 AM  Readmission Risk Prevention Plan  Transportation Screening Complete  Medication Review (RN Care Manager) Complete  PCP or Specialist appointment within 3-5 days of discharge Complete  HRI or [place] Complete  SW Recovery Care/Counseling Consult Complete  Palliative Care Screening Not Applicable  Skilled Nursing Facility Complete

## 2022-07-25 NOTE — NC FL2 (Signed)
[place] LEVEL OF CARE FORM     IDENTIFICATION  Patient Name: [name] Birthdate: 08/15/1940 Sex: female Admission Date (Current Location): 07/02/2022  [facility] and [place] Number:  Herbalist and Address:  [facility], [place] [address], [place], [place] [address]      Provider Number: [id number]  Attending Physician Name and Address:  Jonetta Osgood, MD  Relative Name and Phone Number:       Current Level of Care: Hospital Recommended Level of Care: [place] Prior Approval Number:    Date Approved/Denied:   PASRR Number: [id number]  Discharge Plan: SNF    Current Diagnoses: Patient Active Problem List   Diagnosis Date Noted   Acute gastric ulcer with hemorrhage 07/08/2022   Duodenal ulcer 07/08/2022   Dark stools 07/07/2022   Anemia 07/07/2022   E coli bacteremia 07/04/2022   Acute pyelonephritis [id number]   Acute metabolic encephalopathy [id number]   Hyponatremia 07/03/2022   Sepsis with acute renal failure ([place]) 07/03/2022   DNR (do not resuscitate)/DNI(Do Not Intubate) 07/03/2022   Generalized weakness 05/23/2022   Gallstones 03/17/2022   Acute renal failure superimposed on stage 4 chronic kidney disease ([place]) 0000000   Metabolic acidosis, increased anion gap 03/06/2022   Hip dislocation, left ([place]) 03/06/2022   History of anemia due to chronic kidney disease 03/06/2022   History of DVT (deep vein thrombosis) 03/06/2022   Allergic rhinitis 03/06/2022   History of pulmonary embolism 02/06/2022   Vitamin D deficiency 01/30/2022   Slow transit constipation 01/30/2022   GERD (gastroesophageal reflux disease) 01/30/2022   Status post total replacement of left hip 01/23/2022   Melena    Upper GI bleed 11/17/2021   CKD (chronic kidney disease) stage 4, GFR 15-29 ml/min (HCC) - baseline SCr 2.5. follows with Dr. Royce Macadamia with [facility] 11/17/2021   Primary osteoarthritis of left hip  10/06/2021   Left hip pain 12/09/2020   Aortic atherosclerosis ([place]) 09/07/2020   Gait disorder 06/08/2020   Neoplasm of uncertain behavior of skin 03/14/2020   Arthralgia 12/25/2019   Ankle swelling 08/08/2019   Osteoporosis 02/26/2018   Colon polyp 06/28/2016   COPD (chronic obstructive pulmonary disease) ([place]) 05/28/2016   Upper airway cough syndrome 05/22/2016   Wrist pain, right 12/14/2015   Lung mass 03/02/2015   Pre-operative cardiovascular examination 02/21/2015   DOE (dyspnea on exertion) 02/16/2015   TIA (transient ischemic attack) 12/31/2014   Abnormal mental state 12/30/2014   Well adult exam 11/26/2014   Seasonal allergies 12/31/2013   Splenic artery aneurysm ([place]) 04/07/2013   Cerumen impaction 03/20/2013   Hyperlipidemia    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   Anticoagulated 11/08/2010   Rheumatoid arthritis ([place]) 06/23/2010   Pain in joint 11/26/2009   Acquired hypothyroidism 12/08/2006   Macular degeneration (senile) of retina 12/08/2006   Essential hypertension 12/08/2006   Diaphragmatic hernia 12/08/2006   ASPLENIA 12/08/2006   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) 12/22/1994   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; now atretic and significantly improved LAD lesion without intervention 12/22/1994    Orientation RESPIRATION BLADDER Height & Weight     Self, Time, Situation, Place  Normal Continent Weight: 161 lb 9.6 oz (73.3 kg) Height:  5\' 3"  (160 cm)  BEHAVIORAL SYMPTOMS/MOOD NEUROLOGICAL BOWEL NUTRITION STATUS      Continent Diet (See DC Summary)  AMBULATORY STATUS COMMUNICATION OF  NEEDS Skin   Limited Assist Verbally Normal                       Personal Care Assistance Level of Assistance  Bathing, Feeding, Dressing Bathing Assistance: Limited assistance Feeding assistance: Independent Dressing Assistance: Limited assistance     Functional Limitations Info              SPECIAL CARE FACTORS FREQUENCY  PT (By licensed PT), OT (By licensed OT)     PT Frequency: Eval and treat OT Frequency: Eval and treat            Contractures Contractures Info: Not present    Additional Factors Info  Code Status, Allergies Code Status Info: DNR Allergies Info: Nsaids, Aspirin, Codeine, Nitrostat (Nitroglycerin)           Current Medications (07/25/2022):  This is the current hospital active medication list Current Facility-Administered Medications  Medication Dose Route Frequency Provider Last Rate Last Admin   acetaminophen (TYLENOL) tablet 650 mg  650 mg Oral Q6H PRN Kristopher Oppenheim, DO   650 mg at 07/25/22 [id number]   Or   acetaminophen (TYLENOL) suppository 650 mg  650 mg Rectal Q6H PRN Kristopher Oppenheim, DO       acidophilus (RISAQUAD) capsule 1 capsule  1 capsule Oral TID Elgergawy, Silver Huguenin, MD   1 capsule at 07/25/22 [id number]   apixaban (ELIQUIS) tablet 2.5 mg  2.5 mg Oral BID Duard Brady, MD   2.5 mg at 07/25/22 [id number]   atorvastatin (LIPITOR) tablet 80 mg  80 mg Oral Daily Kristopher Oppenheim, DO   80 mg at 07/25/22 [id number]   carvedilol (COREG) tablet 6.25 mg  6.25 mg Oral BID WC Elgergawy, Silver Huguenin, MD   6.25 mg at 07/25/22 [id number]   hydrALAZINE (APRESOLINE) injection 5 mg  5 mg Intravenous Q6H PRN Elgergawy, Silver Huguenin, MD   5 mg at 07/09/22 2149   hydrALAZINE (APRESOLINE) tablet 25 mg  25 mg Oral Q6H PRN Mariel Aloe, MD   25 mg at 07/07/22 0456   levothyroxine (SYNTHROID) tablet 88 mcg  88 mcg Oral QAC breakfast Kristopher Oppenheim, DO   88 mcg at 07/25/22 0513   lidocaine (LIDODERM) 5 % 1 patch  1 patch Transdermal Q24H Duard Brady, MD   1 patch at 07/23/22 1212   ondansetron (ZOFRAN) tablet 4 mg  4 mg Oral Q6H PRN Kristopher Oppenheim, DO       Or   ondansetron [facility]) injection 4 mg  4 mg Intravenous Q6H PRN Kristopher Oppenheim, DO       Oral care mouth rinse  15 mL Mouth Rinse PRN Darliss [place], MD       pantoprazole (PROTONIX) EC tablet 40 mg  40 mg Oral BID Darliss Cheney, MD   40 mg  at 07/25/22 [id number]   polyethylene glycol (MIRALAX / GLYCOLAX) packet 17 g  17 g Oral Daily PRN Elgergawy, Silver Huguenin, MD       QUEtiapine (SEROQUEL) tablet 25 mg  25 mg Oral Q24H Darliss Cheney, MD   25 mg at 07/24/22 2116   triamcinolone (NASACORT) nasal inhaler 1 spray  1 spray Each Nare Daily Kristopher Oppenheim, DO   1 spray at 07/25/22 [id number]   witch hazel-glycerin (TUCKS) pad 1 Application  1 Application Topical PRN Elgergawy, Silver Huguenin, MD         Discharge Medications: Please see discharge summary for a list of discharge medications.  Relevant Imaging Results:  Relevant  Lab Results:   Additional Information SSN: [id number]  [place], LCSW

## 2022-07-25 NOTE — Progress Notes (Signed)
Occupational Therapy Treatment Patient Details Name: [name] MRN: [id number] DOB: 08-16-1940 Today's Date: 07/25/2022   History of present illness 82 y.o. female [facility] hospital on 07/02/2022 with worsening weakness. Pt admitted for management of acute cystitis. PMH includes CKD IV, HTN, RA, hypothyroidism, CABG, COPD, HLD, PE.   OT comments  Pt progressing towards goals this session, needing min -max A for ADLs, able to stand x3 min for grooming task and ambulate hallway distance. SpO2 decreasing to mid-high 80's on RA, pt given cues for pursed lip breathing, increased to 90's, O2 donned at end of session. Pt presenting with impairments listed below, will follow acutely. Continue to recommend postacute rehab (<3 hours/day) to improve independence with ADLs/functional mobility prior to  d/c home.   Recommendations for follow up therapy are one component of a multi-disciplinary discharge planning process, led by the attending physician.  Recommendations may be updated based on patient status, additional functional criteria and insurance authorization.    Assistance Recommended at Discharge Intermittent Supervision/Assistance  Patient can return home with the following  A little help with walking and/or transfers;A little help with bathing/dressing/bathroom;Assistance with cooking/housework;Assist for transportation;Help with stairs or ramp for entrance   Equipment Recommendations  Other (comment) (defer)    Recommendations for Other Services      Precautions / Restrictions Precautions Precautions: Fall;Other (comment) Precaution Comments: watch 02/RR Restrictions Weight Bearing Restrictions: No       Mobility Bed Mobility               General bed mobility comments: OOB in chair upon arrival and departure    Transfers Overall transfer level: Needs assistance Equipment used: Rolling walker (2 wheels) Transfers: Sit to/from Stand, Bed to chair/wheelchair/BSC Sit  to Stand: Min guard                 Balance Overall balance assessment: Needs assistance Sitting-balance support: Feet supported, No upper extremity supported Sitting balance-Leahy Scale: Good     Standing balance support: During functional activity Standing balance-Leahy Scale: Fair                             ADL either performed or assessed with clinical judgement   ADL Overall ADL's : Needs assistance/impaired     Grooming: Wash/dry face;Standing;Min guard Grooming Details (indicate cue type and reason): standing at sink         Upper Body Dressing : Minimal assistance;Standing Upper Body Dressing Details (indicate cue type and reason): donning gown in standing Lower Body Dressing: Maximal assistance Lower Body Dressing Details (indicate cue type and reason): for pulling up socks in sitting Toilet Transfer: Min guard;Ambulation;Rolling walker (2 wheels);Regular Toilet           Functional mobility during ADLs: Min guard;Rolling walker (2 wheels) General ADL Comments: decreased awareness of activity tolerance    Extremity/Trunk Assessment Upper Extremity Assessment Upper Extremity Assessment: Generalized weakness   Lower Extremity Assessment Lower Extremity Assessment: Defer to PT evaluation        Vision   Additional Comments: pt does not report visual symptoms this session, reports she has not been wearing vision occlusion glasses   Perception Perception Perception: Not tested   Praxis Praxis Praxis: Not tested    Cognition Arousal/Alertness: Awake/alert Behavior During Therapy: WFL for tasks assessed/performed Overall Cognitive Status: Impaired/Different from baseline Area of Impairment: Following commands, Awareness, Problem solving  Current Attention Level: Alternating, Selective   Following Commands: Follows multi-step commands consistently   Awareness: Anticipatory Problem Solving: Slow processing,  Decreased initiation          Exercises      Shoulder Instructions       General Comments      Pertinent Vitals/ Pain       Pain Assessment Pain Assessment: Faces Pain Score: 5  Faces Pain Scale: Hurts even more Pain Location: low back Pain Descriptors / Indicators: Discomfort Pain Intervention(s): Limited activity within patient's tolerance, Monitored during session, Repositioned  Home Living                                          Prior Functioning/Environment              Frequency  Min 2X/week        Progress Toward Goals  OT Goals(current goals can now be found in the care plan section)  Progress towards OT goals: Progressing toward goals  Acute Rehab OT Goals Patient Stated Goal: to get better OT Goal Formulation: With patient Time For Goal Achievement: 08/01/22 Potential to Achieve Goals: Good ADL Goals Pt Will Perform Grooming: with modified independence;standing Pt Will Perform Upper Body Dressing: with modified independence;sitting Pt Will Perform Lower Body Dressing: with modified independence;sit to/from stand Pt Will Transfer to Toilet: with modified independence;ambulating;regular height toilet Pt/caregiver will Perform Home Exercise Program: Increased strength;With written HEP provided;Right Upper extremity;Left upper extremity;Both right and left upper extremity Additional ADL Goal #1: Pt will verbalize use of visual occlusion glasses with taping over R eye when experiencing double vision. Additional ADL Goal #2: Pt will perform VOR exercises to optimize visual tracking horizintally without jumping movements in her eyes.  Plan Discharge plan needs to be updated;Frequency remains appropriate    Co-evaluation                 AM-PAC OT "6 Clicks" Daily Activity     Outcome Measure   Help from another person eating meals?: None Help from another person taking care of personal grooming?: A Little Help from another  person toileting, which includes using toliet, bedpan, or urinal?: A Little Help from another person bathing (including washing, rinsing, drying)?: A Lot Help from another person to put on and taking off regular upper body clothing?: A Little Help from another person to put on and taking off regular lower body clothing?: A Lot 6 Click Score: 17    End of Session Equipment Utilized During Treatment: Gait belt;Rolling walker (2 wheels)  OT Visit Diagnosis: Unsteadiness on feet (R26.81);Muscle weakness (generalized) (M62.81);Other abnormalities of gait and mobility (R26.89)   Activity Tolerance Patient tolerated treatment well   Patient Left in chair;with call bell/phone within reach;with family/visitor present   Nurse Communication Mobility status; SpO2 drop        Time: [id number] OT Time Calculation (min): 27 min  Charges: OT General Charges $OT Visit: 1 Visit OT Treatments $Self Care/Home Management : 8-22 mins $Therapeutic Activity: 8-22 mins  Renaye Rakers, OTD, OTR/L SecureChat Preferred Acute Rehab [phone]   Avigayil Ton K Koonce 07/25/2022, 9:07 AM

## 2022-07-25 NOTE — Progress Notes (Signed)
Admit: 07/02/2022 LOS: 40  [name] is an 82yo female with history of CKD IV who follows with Dr. Royce Macadamia, HTN, RA, hypothyroidism, CAD s/p CABG, COPD, HLD, DVT/PE who presented with weakness and fever, found to have pyelonephritis and urosepsis. Hospital course complicated by acute blood loss anemia 2/2 gastric/duodenal ulcers. Nephrology consulted due to AKI on CKD IV.   Subjective:  Feeling generally well. Notes significant improvement with her edema. Would like to get to SNF today if possible.    03/25 0701 - 03/26 0700 In: 1020 [P.O.:1020] Out: -   Filed Weights   07/21/22 0424 07/24/22 0500 07/25/22 0500  Weight: 72.1 kg 72.6 kg 73.3 kg    Scheduled Meds:  acidophilus  1 capsule Oral TID   apixaban  2.5 mg Oral BID   atorvastatin  80 mg Oral Daily   carvedilol  6.25 mg Oral BID WC   levothyroxine  88 mcg Oral QAC breakfast   lidocaine  1 patch Transdermal Q24H   pantoprazole  40 mg Oral BID   QUEtiapine  25 mg Oral Q24H   triamcinolone  1 spray Each Nare Daily   Continuous Infusions: PRN Meds:.acetaminophen **OR** acetaminophen, hydrALAZINE, hydrALAZINE, ondansetron **OR** ondansetron (ZOFRAN) IV, mouth rinse, polyethylene glycol, witch hazel-glycerin  Current Labs: reviewed   Physical Exam:  Blood pressure (!) 149/53, pulse 78, temperature 98.3 F (36.8 C), temperature source Oral, resp. rate 18, height 5\' 3"  (1.6 m), weight 73.3 kg, SpO2 96 %. Gen: Sitting up in recliner at bedside, comfortable and in good spirits Cardio: RRR, no m/r/g, 2+ pitting edema to mid-shin bilaterally, much improved compared to previous exams Pulm: Normal WOB on 2L, lung clear Abd: Without distention, abdominal wall edema, or fluid wave  A&P 1. Acute kidney Injury on chronic kidney disease stage IV Baseline creatinine around 2.2.  Suspected to have had multifactorial acute kidney injury resulting in ATN.  UOP poorly charted, but she endorses good UOP. LE Edema is much improved, so I do  believe she is diuresing appropriately without need for Lasix. Cr downtrending nicely.  - She is stable to discharge from a nephrology point of view. I do think it prudent to continue to hold Lasix on discharge. Can reassess need for loop diuretics on an outpatient basis. As she is discharging to SNF, will allow SNF MD discretion on restarting should she gain >3 lbs in one day or 5lbs in a week.  - Will arrange for outpt follow-up with primary nephrologist Dr. Royce Macadamia  2.  Hypertension  Blood pressures fairly controlled on carvedilol status post discontinuation of amlodipine.  3.  Status post E. coli pyelonephritis/bacteremia with sepsis Status post completion of intravenous antibiotic course and resolution of sepsis markers.  4.  Deconditioning Status post prolonged hospitalization/acute illness, plans noted for discharge to SNF.  5.  Acute blood loss anemia With recent acute upper GI bleed secondary to GU/DU.  Eliquis transiently held with ongoing treatment with PPI. No evidence of ongoing blood loss. Hemoglobin stable.       [name] MD 07/25/2022, 7:52 AM  Recent Labs  Lab 07/23/22 0645 07/24/22 1009 07/25/22 0533  NA 137 138 139  K 3.8 4.3 4.0  CL 103 103 104  CO2 22 21* 21*  GLUCOSE 107* 132* 91  BUN 63* 65* 62*  CREATININE 3.95* 3.84* 3.71*  CALCIUM 9.1 9.3 9.6  PHOS 4.8* 4.9* 5.4*   Recent Labs  Lab 07/19/22 0237 07/19/22 2134 07/20/22 0423 07/22/22 0247 07/23/22 0327 07/25/22  0533  WBC 10.8*  --  11.1* 10.7* 8.3 8.1  NEUTROABS 6.0  --  6.7  --   --   --   HGB 7.0*   < > 7.6* 7.7* 7.9* 8.1*  HCT 20.5*   < > 22.4* 23.6* 24.1* 24.8*  MCV 92.8  --  92.2 94.4 93.1 94.7  PLT 285  --  272 243 220 321   < > = values in this interval not displayed.

## 2022-07-25 NOTE — TOC Transition Note (Signed)
Transition of Care [facility]) - CM/SW Discharge Note   Patient Details  Name: [name] MRN: [id number] Date of Birth: 18-Sep-1940  [facility] CM/SW Contact:  Benard Halsted, LCSW Phone Number: 07/25/2022, 2:16 PM   Clinical Narrative:    Patient will DC to: [facility]) Anticipated DC date: 07/25/22 Family notified: Spouse and son at bedside Transport by: Corey Harold   Per MD patient ready for DC to [facility]. RN to call report prior to discharge [phone]). RN, patient, patient's family, and facility notified of DC. Discharge Summary and FL2 sent to facility. DC packet on chart including signed DNR. Ambulance transport requested for patient.   CSW will sign off for now as social work intervention is no longer needed. Please consult [place] again if new needs arise.     Final next level of care: Skilled Nursing Facility Barriers to Discharge: Barriers Resolved   Patient Goals and CMS Choice CMS Medicare.gov Compare Post Acute Care list provided to:: Patient Choice offered to / list presented to : Patient, Spouse  Discharge Placement     Existing PASRR number confirmed : 07/25/22          Patient chooses bed at: [place] Patient to be transferred to facility by: [place] Name of family member notified: Spouse and son Patient and family notified of of transfer: 07/25/22  Discharge Plan and Services Additional resources added to the After Visit Summary for   In-house Referral: Clinical Social Work                                   Social Determinants of Health (SDOH) Interventions SDOH Screenings   Food Insecurity: No Food Insecurity (06/13/2022)  Housing: Low Risk  (06/13/2022)  Transportation Needs: No Transportation Needs (06/13/2022)  Utilities: Not At Risk (06/13/2022)  Alcohol Screen: Low Risk  (05/30/2021)  Depression (PHQ2-9): Low Risk  (06/15/2022)  Financial Resource Strain: Low Risk  (06/13/2022)  Physical  Activity: Inactive (06/13/2022)  Social Connections: Unknown (06/13/2022)  Stress: No Stress Concern Present (06/13/2022)  Tobacco Use: Medium Risk (07/11/2022)     Readmission Risk Interventions    07/05/2022    9:42 AM  Readmission Risk Prevention Plan  Transportation Screening Complete  Medication Review (RN Care Manager) Complete  PCP or Specialist appointment within 3-5 days of discharge Complete  HRI or [place] Complete  SW Recovery Care/Counseling Consult Complete  [place] Complete

## 2022-07-25 NOTE — Discharge Summary (Addendum)
PATIENT DETAILS Name: [name] Age: 82 y.o. Sex: female Date of Birth: 06-16-1940 MRN: [id number]. Admitting Physician: Sherry Oppenheim, DO [id number], Sherry Lacks, MD  Admit Date: 07/02/2022 Discharge date: 07/25/2022  Recommendations for Outpatient Follow-up:  Follow up with PCP in 1-2 weeks Please obtain CMP/CBC in one week Please ensure follow up with nephrology.  Admitted From:  Home  Disposition: SNF   Discharge Condition: good  CODE STATUS:   Code Status: DNR   Diet recommendation:  Diet Order             Diet - low sodium heart healthy           Diet Heart Room service appropriate? Yes; Fluid consistency: Thin  Diet effective now                    Brief Summary: Patient is a 82 y.o.  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  Hospital course complicated by GI bleeding with acute blood loss anemia, and AKI.  See below for further details.   Significant events: 3/3>> admit to [facility]   Significant studies: 3/3>> CT head: No acute abnormality 3/3>> CXR: Bibasilar atelectasis 3/12>> renal ultrasound: No hydronephrosis.   Significant microbiology data: 3/3>> COVID/influenza/RSV PCR: Negative 3/3>> urine culture: E. coli/Klebsiella 3/4>> blood culture: E. coli   Procedures: 3/9>> EGD: 10 gastric ulcers, nonbleeding duodenal ulcer (biopsy-negative for H. pylori/malignancy)   Consults: None  Brief Hospital Course: Sepsis due to acute right pyelonephritis with E. coli bacteremia Sepsis physiology resolved Completed a course of antibiotics.   Upper GI bleeding with acute blood loss anemia No further melena Etiology due to nonbleeding gastric/duodenal ulcers Hb stable Continue PPI twice daily x 6 weeks-followed by once daily   Acute hypoxic respiratory failure Due to atelectasis Improved with pulmonary  toileting-now on room air.   Acute metabolic encephalopathy Secondary to sepsis/pyelonephritis-hyponatremia Resolved-completely awake/alert   Hyponatremia Due to hypovolemia/dehydration Improved with IVF   AKI on CKD 4 AKI felt to be hemodynamically mediated Improving with supportive care Nephrology will arrange for follow-up in the outpatient setting-per nephrology-continue to hold diuretics on discharge.  Has some lower extremity edema but it has improved spontaneously.   Hypokalemia Resolved   History of VTE Continue Eliquis as GI bleeding has resolved-Hb stable.   COPD Not in flare Continue bronchodilators   CAD-s/p CABG HLD Continue statin/Coreg Suspect not on antiplatelets-is on anticoagulation   Hypothyroidism Synthroid   RA Resume Arava as outpatient-held due to infection   Debility/deconditioning PT/OT eval Apparently SNF declined by insurance-even after peer to peer Social worker discussing with patient but likely will either go home with home health or private pay SNF for a few weeks.   BMI: Estimated body mass index is 28.63 kg/m as calculated from the following:   Height as of this encounter: 5\' 3"  (1.6 m).   Weight as of this encounter: 73.3 kg.   Discharge Diagnoses:  Principal Problem:   Acute pyelonephritis Active Problems:   Acute renal failure superimposed on stage 4 chronic kidney disease (HCC)   Acute metabolic encephalopathy   Hyponatremia   Sepsis with acute renal failure (HCC)   Acquired hypothyroidism   Essential hypertension   Rheumatoid arthritis ([name])   Anticoagulated   Hx of CABG   Hyperlipidemia   COPD (chronic obstructive pulmonary disease) (HCC)   CKD (chronic  kidney disease) stage 4, GFR 15-29 ml/min (HCC) - baseline SCr 2.5. follows with Dr. Royce [name] with [facility]   History of pulmonary embolism   Upper GI bleed   Melena   Metabolic acidosis, increased anion gap   Generalized weakness   DNR (do not  resuscitate)/DNI(Do Not Intubate)   E coli bacteremia   Dark stools   Anemia   Acute gastric ulcer with hemorrhage   Duodenal ulcer   Discharge Instructions:  Activity:  As tolerated with Full fall precautions use walker/cane & assistance as needed  Discharge Instructions     Diet - low sodium heart healthy   Complete by: As directed    Discharge instructions   Complete by: As directed    Follow with Primary MD  [name], Sherry Lacks, MD in 1-2 weeks  Please get a complete blood count and chemistry panel checked by your Primary MD at your next visit, and again as instructed by your Primary MD.  Get Medicines reviewed and adjusted: Please take all your medications with you for your next visit with your Primary MD  Laboratory/radiological data: Please request your Primary MD to go over all hospital tests and procedure/radiological results at the follow up, please ask your Primary MD to get all Hospital records sent to his/her office.  In some cases, they will be blood work, cultures and biopsy results pending at the time of your discharge. Please request that your primary care M.D. follows up on these results.  Also Note the following: If you experience worsening of your admission symptoms, develop shortness of breath, life threatening emergency, suicidal or homicidal thoughts you must seek medical attention immediately by calling 911 or calling your MD immediately  if symptoms less severe.  You must read complete instructions/literature along with all the possible adverse reactions/side effects for all the Medicines you take and that have been prescribed to you. Take any new Medicines after you have completely understood and accpet all the possible adverse reactions/side effects.   Do not drive when taking Pain medications or sleeping medications (Benzodaizepines)  Do not take more than prescribed Pain, Sleep and Anxiety Medications. It is not advisable to combine anxiety,sleep  and pain medications without talking with your primary care practitioner  Special Instructions: If you have smoked or chewed Tobacco  in the last 2 yrs please stop smoking, stop any regular Alcohol  and or any Recreational drug use.  Wear Seat belts while driving.  Please note: You were cared for by a hospitalist during your hospital stay. Once you are discharged, your primary care physician will handle any further medical issues. Please note that NO REFILLS for any discharge medications will be authorized once you are discharged, as it is imperative that you return to your primary care physician (or establish a relationship with a primary care physician if you do not have one) for your post hospital discharge needs so that they can reassess your need for medications and monitor your lab values.   Increase activity slowly   Complete by: As directed       Allergies as of 07/25/2022       Reactions   Nsaids Other (See Comments)   Stomach upset Told to avoid due to Eliquis   Aspirin Other (See Comments)   Stomach upset Told to avoid due to Eliquis   Codeine Nausea And Vomiting   Nitrostat [nitroglycerin] Other (See Comments)   Bradycardia. Drop in heart rate  Medication List     TAKE these medications    acetaminophen 325 MG tablet Commonly known as: TYLENOL Take 2 tablets (650 mg total) by mouth 2 (two) times daily as needed (generalized pain).   albuterol 108 (90 Base) MCG/ACT inhaler Commonly known as: VENTOLIN HFA Inhale 1-2 puffs into the lungs every 4 (four) hours as needed for wheezing or shortness of breath.   apixaban 2.5 MG Tabs tablet Commonly known as: ELIQUIS Take 1 tablet (2.5 mg total) by mouth 2 (two) times daily.   ascorbic acid 500 MG tablet Commonly known as: VITAMIN C Take 1 tablet (500 mg total) by mouth daily.   atorvastatin 80 MG tablet Commonly known as: LIPITOR Take 1 tablet (80 mg total) by mouth daily.   carvedilol 3.125 MG  tablet Commonly known as: COREG Take 1 tablet (3.125 mg total) by mouth 2 (two) times daily with a meal.   CENTRUM ADULT PO Take 1 tablet by mouth daily. 1 Tablet Daily.   Cyanocobalamin 2500 MCG Tabs Take 2,500 mcg by mouth daily.   docusate sodium 100 MG capsule Commonly known as: Colace Take 1-2 capsules (100-200 mg total) by mouth daily as needed for mild constipation.   gabapentin 300 MG capsule Commonly known as: NEURONTIN Take 1 capsule (300 mg total) by mouth at bedtime.   Golimumab 50 MG/0.5ML Soaj Take 50 mg by mouth every 2 (two) months. Hold for the next 2 weeks per Orthopedic surgery recommendations.   leflunomide 20 MG tablet Commonly known as: ARAVA Take 1 tablet (20 mg total) by mouth daily.   levothyroxine 88 MCG tablet Commonly known as: SYNTHROID Take 1 tablet (88 mcg total) by mouth daily before breakfast.   pantoprazole 40 MG tablet Commonly known as: PROTONIX Take 1 tablet (40 mg total) by mouth 2 (two) times daily. Take twice daily x 6 weeks, then once daily thereafter.   polyethylene glycol 17 g packet Commonly known as: MIRALAX / GLYCOLAX Take 17 g by mouth daily as needed. What changed: reasons to take this   triamcinolone 55 MCG/ACT Aero nasal inhaler Commonly known as: NASACORT Place 1 spray into the nose daily.   Vitamin D3 50 MCG (2000 UT) Caps Generic drug: Cholecalciferol Take 1 capsule (2,000 Units total) by mouth daily.        Contact information for follow-up providers     [name], Sherry Lacks, MD. Schedule an appointment as soon as possible for a visit in 1 week(s).   Specialty: Internal Medicine Contact information: [place] [address] [phone]         Claudia Desanctis, MD Follow up.   Specialty: Nephrology Why: Office will call with date/time Contact information: [place] [address] [phone]              Contact information for after-discharge care     Destination      [facility] .   Service: Skilled Nursing Contact information: [place] [phone]                    Allergies  Allergen Reactions   Nsaids Other (See Comments)    Stomach upset Told to avoid due to Eliquis   Aspirin Other (See Comments)    Stomach upset Told to avoid due to Eliquis   Codeine Nausea And Vomiting   Nitrostat [Nitroglycerin] Other (See Comments)    Bradycardia. Drop in heart rate  Other Procedures/Studies: DG CHEST PORT 1 VIEW  Result Date: 07/15/2022 CLINICAL DATA:  Hypoxia EXAM: PORTABLE CHEST 1 VIEW COMPARISON:  07/11/2022 FINDINGS: Prior CABG. Heart is borderline in size. Mediastinal contours within normal limits. Bilateral lower lobe airspace opacities have increased since prior study. Small bilateral effusions. No acute bony abnormality. IMPRESSION: Worsening bibasilar atelectasis or infiltrates with small effusions. Electronically Signed   By: Rolm Baptise M.D.   On: 07/15/2022 20:13   DG CHEST PORT 1 VIEW  Result Date: 07/11/2022 CLINICAL DATA:  Hypoxia EXAM: PORTABLE CHEST 1 VIEW COMPARISON:  CXR 07/08/22 FINDINGS: Small bilateral pleural effusions. Status post median sternotomy. No pneumothorax. Bibasilar airspace opacities which could represent atelectasis or infection. Cardiac and mediastinal contours are unchanged. No radiographically apparent displaced rib fractures. Visualized upper abdomen is unremarkable. IMPRESSION: 1. Small bilateral pleural effusions. 2. Redemonstrated bibasilar airspace opacities, which could represent atelectasis or infection. Electronically Signed   By: Marin Roberts M.D.   On: 07/11/2022 16:55   US RENAL  Result Date: 07/11/2022 CLINICAL DATA:  Acute kidney injury in 82 year old female. EXAM: RENAL / URINARY TRACT ULTRASOUND COMPLETE COMPARISON:  MRI from November 10, 2021 FINDINGS: Right Kidney: Renal measurements: 9.6 x 4.1 x 4.2 cm = volume: 88  mL. No signs of hydronephrosis. Increased cortical echogenicity with marked renal cortical/parenchymal scarring. Cysts of the RIGHT kidney largest 3.9 x 4.7 x 3.6 cm in the upper pole with several smaller cysts not substantially changed compared to previous imaging for which no additional dedicated follow-up imaging is recommend. Left Kidney: Renal measurements: 9.8 x 4.5 x 4.4 cm = volume: 103 mL. Cysts on the LEFT as well largest 2.5 x 1.8 x 2.1 cm for which no additional dedicated follow-up imaging is recommended. Increased cortical echogenicity and parenchymal scarring is very similar to the recent/prior MRI evaluation. There is no hydronephrosis. Bladder: Urinary bladder is decompressed not well evaluated. Other: None. IMPRESSION: 1. No signs of hydronephrosis. 2. Increased cortical echogenicity and parenchymal scarring in both kidneys as can be seen in the setting of chronic medical renal disease. Electronically Signed   By: Zetta Bills M.D.   On: 07/11/2022 09:05   DG CHEST PORT 1 VIEW  Result Date: 07/08/2022 CLINICAL DATA:  Shortness of breath. EXAM: PORTABLE CHEST 1 VIEW COMPARISON:  07/02/2022 FINDINGS: Median sternotomy and CABG. The heart size is normal. There are small bilateral pleural effusions. There is new bibasilar patchy opacity. IMPRESSION: 1. Small bilateral pleural effusions. 2. New bibasilar patchy opacities and cardiomegaly. Findings are consistent with pulmonary edema or infectious cause. Electronically Signed   By: Nolon Nations M.D.   On: 07/08/2022 11:38   DG Chest Portable 1 View  Result Date: 07/03/2022 CLINICAL DATA:  Altered mental status EXAM: PORTABLE CHEST 1 VIEW COMPARISON:  03/06/2022 FINDINGS: Prior CABG. Heart is borderline in size. Bibasilar linear atelectasis or scarring. No effusions or edema. No acute bony abnormality. IMPRESSION: Bibasilar atelectasis or scarring. Electronically Signed   By: Rolm Baptise M.D.   On: 07/03/2022 00:16   CT HEAD WO CONTRAST  (5MM)  Result Date: 07/02/2022 CLINICAL DATA:  Weakness for 2 days with left facial droop EXAM: CT HEAD WITHOUT CONTRAST TECHNIQUE: Contiguous axial images were obtained from the base of the skull through the vertex without intravenous contrast. RADIATION DOSE REDUCTION: This exam was performed according to the departmental dose-optimization program which includes automated exposure control, adjustment of the mA and/or kV according to patient size and/or use of iterative reconstruction technique. COMPARISON:  02/06/2022  FINDINGS: Brain: No evidence of acute infarction, hemorrhage, hydrocephalus, extra-axial collection or mass lesion/mass effect. Mild atrophic changes and chronic white matter ischemic changes are noted. Vascular: No hyperdense vessel or unexpected calcification. Skull: Normal. Negative for fracture or focal lesion. Sinuses/Orbits: No acute finding. Other: None. IMPRESSION: Chronic atrophic and ischemic changes.  No acute abnormality noted. Electronically Signed   By: Inez Catalina M.D.   On: 07/02/2022 23:45     TODAY-DAY OF DISCHARGE:  Subjective:   [name] today has no headache,no chest abdominal pain,no new weakness tingling or numbness, feels much better wants to go home today.   Objective:   Blood pressure (!) 168/70, pulse 77, temperature 98 F (36.7 C), temperature source Oral, resp. rate 16, height 5\' 3"  (1.6 m), weight 73.3 kg, SpO2 96 %.  Intake/Output Summary (Last 24 hours) at 07/25/2022 1125 Last data filed at 07/24/2022 1800 Gross per 24 hour  Intake 820 ml  Output --  Net 820 ml   Filed Weights   07/21/22 0424 07/24/22 0500 07/25/22 0500  Weight: 72.1 kg 72.6 kg 73.3 kg    Exam: Awake Alert, Oriented *3, No new F.N deficits, Normal affect [place].AT,PERRAL Supple Neck,No JVD, No cervical lymphadenopathy appriciated.  Symmetrical Chest wall movement, Good air movement bilaterally, CTAB RRR,No Gallops,Rubs or new Murmurs, No Parasternal Heave +ve B.Sounds,  Abd Soft, Non tender, No organomegaly appriciated, No rebound -guarding or rigidity. No Cyanosis, Clubbing or edema, No new Rash or bruise   PERTINENT RADIOLOGIC STUDIES: No results found.   PERTINENT LAB RESULTS: CBC: Recent Labs    07/23/22 0327 07/25/22 0533  WBC 8.3 8.1  HGB 7.9* 8.1*  HCT 24.1* 24.8*  PLT 220 321   CMET CMP     Component Value Date/Time   NA 139 07/25/2022 0533   NA 138 02/14/2022 0000   K 4.0 07/25/2022 0533   CL 104 07/25/2022 0533   CO2 21 (L) 07/25/2022 0533   GLUCOSE 91 07/25/2022 0533   BUN 62 (H) 07/25/2022 0533   BUN 22 (A) 02/14/2022 0000   CREATININE 3.71 (H) 07/25/2022 0533   CREATININE 2.19 (H) 12/26/2019 1005   CALCIUM 9.6 07/25/2022 0533   PROT 6.3 (L) 07/14/2022 0747   ALBUMIN 2.9 (L) 07/25/2022 0533   AST 13 (L) 07/14/2022 0747   ALT 5 07/14/2022 0747   ALKPHOS 88 07/14/2022 0747   BILITOT 0.9 07/14/2022 0747   GFRNONAA 12 (L) 07/25/2022 0533   GFRNONAA 20 (L) 12/26/2019 1005   GFRAA 24 (L) 12/26/2019 1005    GFR Estimated Creatinine Clearance: 10.8 mL/min (A) (by C-G formula based on SCr of 3.71 mg/dL (H)). No results for input(s): "LIPASE", "AMYLASE" in the last 72 hours. No results for input(s): "CKTOTAL", "CKMB", "CKMBINDEX", "TROPONINI" in the last 72 hours. Invalid input(s): "POCBNP" No results for input(s): "DDIMER" in the last 72 hours. No results for input(s): "HGBA1C" in the last 72 hours. No results for input(s): "CHOL", "HDL", "LDLCALC", "TRIG", "CHOLHDL", "LDLDIRECT" in the last 72 hours. No results for input(s): "TSH", "T4TOTAL", "T3FREE", "THYROIDAB" in the last 72 hours.  Invalid input(s): "FREET3" No results for input(s): "VITAMINB12", "FOLATE", "FERRITIN", "TIBC", "IRON", "RETICCTPCT" in the last 72 hours. Coags: No results for input(s): "INR" in the last 72 hours.  Invalid input(s): "PT" Microbiology: No results found for this or any previous visit (from the past 240 hour(s)).  FURTHER DISCHARGE  INSTRUCTIONS:  Get Medicines reviewed and adjusted: Please take all your medications with you for your next visit  with your Primary MD  Laboratory/radiological data: Please request your Primary MD to go over all hospital tests and procedure/radiological results at the follow up, please ask your Primary MD to get all Hospital records sent to his/her office.  In some cases, they will be blood work, cultures and biopsy results pending at the time of your discharge. Please request that your primary care M.D. goes through all the records of your hospital data and follows up on these results.  Also Note the following: If you experience worsening of your admission symptoms, develop shortness of breath, life threatening emergency, suicidal or homicidal thoughts you must seek medical attention immediately by calling 911 or calling your MD immediately  if symptoms less severe.  You must read complete instructions/literature along with all the possible adverse reactions/side effects for all the Medicines you take and that have been prescribed to you. Take any new Medicines after you have completely understood and accpet all the possible adverse reactions/side effects.   Do not drive when taking Pain medications or sleeping medications (Benzodaizepines)  Do not take more than prescribed Pain, Sleep and Anxiety Medications. It is not advisable to combine anxiety,sleep and pain medications without talking with your primary care practitioner  Special Instructions: If you have smoked or chewed Tobacco  in the last 2 yrs please stop smoking, stop any regular Alcohol  and or any Recreational drug use.  Wear Seat belts while driving.  Please note: You were cared for by a hospitalist during your hospital stay. Once you are discharged, your primary care physician will handle any further medical issues. Please note that NO REFILLS for any discharge medications will be authorized once you are discharged, as it is  imperative that you return to your primary care physician (or establish a relationship with a primary care physician if you do not have one) for your post hospital discharge needs so that they can reassess your need for medications and monitor your lab values.  Total Time spent coordinating discharge including counseling, education and face to face time equals greater than 30 minutes.  SignedOren Binet 07/25/2022 11:25 AM

## 2022-07-25 NOTE — Progress Notes (Signed)
PROGRESS NOTE        PATIENT DETAILS Name: [name] Age: 82 y.o. Sex: female Date of Birth: 03-28-1941 Admit Date: 07/02/2022 Admitting Physician Kristopher Oppenheim, DO QP:[id number], [name], MD  Brief Summary: Patient is a 82 y.o.  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  Hospital course complicated by GI bleeding with acute blood loss anemia, and AKI.  See below for further details.  Significant events: 3/3>> admit to [facility]  Significant studies: 3/3>> CT head: No acute abnormality 3/3>> CXR: Bibasilar atelectasis 3/12>> renal ultrasound: No hydronephrosis.  Significant microbiology data: 3/3>> COVID/influenza/RSV PCR: Negative 3/3>> urine culture: E. coli/Klebsiella 3/4>> blood culture: E. coli  Procedures: 3/9>> EGD: 10 gastric ulcers, nonbleeding duodenal ulcer (biopsy-negative for H. pylori/malignancy)  Consults: None  Subjective: Lying comfortably in bed-denies any chest pain or shortness of breath.  Objective: Vitals: Blood pressure (!) 168/70, pulse 77, temperature 98 F (36.7 C), temperature source Oral, resp. rate 16, height 5\' 3"  (1.6 m), weight 73.3 kg, SpO2 96 %.   Exam: Gen Exam:Alert awake-not in any distress HEENT:atraumatic, normocephalic Chest: B/L clear to auscultation anteriorly CVS:S1S2 regular Abdomen:soft non tender, non distended Extremities:no edema Neurology: Non focal Skin: no rash  Pertinent Labs/Radiology:    Latest Ref Rng & Units 07/25/2022    5:33 AM 07/23/2022    3:27 AM 07/22/2022    2:47 AM  CBC  WBC 4.0 - 10.5 K/uL 8.1  8.3  10.7   Hemoglobin 12.0 - 15.0 g/dL 8.1  7.9  7.7   Hematocrit 36.0 - 46.0 % 24.8  24.1  23.6   Platelets 150 - 400 K/uL 321  220  243     Lab Results  Component Value Date   NA 139 07/25/2022   K 4.0 07/25/2022   CL 104 07/25/2022    CO2 21 (L) 07/25/2022      Assessment/Plan: Sepsis due to acute right pyelonephritis with E. coli bacteremia Sepsis physiology resolved Completed a course of antibiotics.  Upper GI bleeding with acute blood loss anemia No further melena Etiology due to nonbleeding gastric/duodenal ulcers Hb stable Continue PPI twice daily x 6 weeks-followed by once daily  Acute hypoxic respiratory failure Due to atelectasis Improved with pulmonary toileting-now on room air.  Acute metabolic encephalopathy Secondary to sepsis/pyelonephritis-hyponatremia Resolved-completely awake/alert  Hyponatremia Due to hypovolemia/dehydration Improved with IVF  AKI on CKD 4 AKI felt to be hemodynamically mediated Improving with supportive care  Hypokalemia Resolved  History of VTE Continue Eliquis as GI bleeding has resolved-Hb stable.  COPD Not in flare Continue bronchodilators  CAD-s/p CABG HLD Continue statin/Coreg Suspect not on antiplatelets-is on anticoagulation  Hypothyroidism Synthroid  RA Resume Arava as outpatient-held due to infection  Debility/deconditioning PT/OT eval Apparently SNF declined by insurance-even after peer to peer Social worker evaluation in progress.  BMI: Estimated body mass index is 28.63 kg/m as calculated from the following:   Height as of this encounter: 5\' 3"  (1.6 m).   Weight as of this encounter: 73.3 kg.   Code status:   Code Status: DNR   DVT Prophylaxis: apixaban (ELIQUIS) tablet 2.5 mg Start: 07/22/22 1230 Place TED hose Start: 07/15/22 0954 SCDs Start: 07/03/22 0431 apixaban (ELIQUIS) tablet 2.5 mg  Family Communication: Son at bedside   Disposition Plan: Status is: Inpatient Remains inpatient appropriate because: Severity of illness   Planned Discharge Destination: SNF versus home health-social worker discussion in progress with family.   Diet: Diet Order             Diet Heart Room service appropriate? Yes; Fluid  consistency: Thin  Diet effective now                     Antimicrobial agents: Anti-infectives (From admission, onward)    Start     Dose/Rate Route Frequency Ordered Stop   07/08/22 1245  ampicillin-sulbactam (UNASYN) 1.5 g in sodium chloride 0.9 % 100 mL IVPB        1.5 g 200 mL/hr over 30 Minutes Intravenous Every 12 hours 07/08/22 1154 07/13/22 1030   07/05/22 2000  ceFAZolin (ANCEF) IVPB 2g/100 mL premix  Status:  Discontinued        2 g 200 mL/hr over 30 Minutes Intravenous Every 12 hours 07/05/22 0958 07/05/22 1004   07/05/22 1530  ceFAZolin (ANCEF) IVPB 2g/100 mL premix  Status:  Discontinued        2 g 200 mL/hr over 30 Minutes Intravenous Every 12 hours 07/05/22 1004 07/08/22 1145   07/04/22 0000  cefTRIAXone (ROCEPHIN) 1 g in sodium chloride 0.9 % 100 mL IVPB  Status:  Discontinued        1 g 200 mL/hr over 30 Minutes Intravenous Every 24 hours 07/03/22 0211 07/03/22 1153   07/03/22 1600  cefTRIAXone (ROCEPHIN) 2 g in sodium chloride 0.9 % 100 mL IVPB  Status:  Discontinued        2 g 200 mL/hr over 30 Minutes Intravenous Every 24 hours 07/03/22 1153 07/05/22 0958   07/03/22 0215  cefTRIAXone (ROCEPHIN) 1 g in sodium chloride 0.9 % 100 mL IVPB        1 g 200 mL/hr over 30 Minutes Intravenous  Once 07/03/22 0212 07/03/22 0243   07/03/22 0145  vancomycin (VANCOREADY) IVPB 1250 mg/250 mL  Status:  Discontinued        1,250 mg 166.7 mL/hr over 90 Minutes Intravenous  Once 07/03/22 0132 07/03/22 0210   07/03/22 0130  ceFEPIme (MAXIPIME) 2 g in sodium chloride 0.9 % 100 mL IVPB  Status:  Discontinued        2 g 200 mL/hr over 30 Minutes Intravenous  Once 07/03/22 0124 07/03/22 0210   07/03/22 0130  metroNIDAZOLE (FLAGYL) IVPB 500 mg  Status:  Discontinued        500 mg 100 mL/hr over 60 Minutes Intravenous  Once 07/03/22 0124 07/03/22 0210   07/03/22 0130  vancomycin (VANCOCIN) IVPB 1000 mg/200 mL premix  Status:  Discontinued        1,000 mg 200 mL/hr over 60 Minutes  Intravenous  Once 07/03/22 0124 07/03/22 0130        MEDICATIONS: Scheduled Meds:  acidophilus  1 capsule Oral TID   apixaban  2.5 mg Oral BID   atorvastatin  80 mg Oral Daily   carvedilol  6.25 mg Oral BID WC   levothyroxine  88 mcg Oral QAC breakfast   lidocaine  1 patch Transdermal Q24H   pantoprazole  40 mg Oral BID   QUEtiapine  25 mg Oral Q24H   triamcinolone  1 spray Each Nare Daily   Continuous Infusions: PRN Meds:.acetaminophen **OR** acetaminophen, hydrALAZINE, hydrALAZINE, ondansetron **OR** ondansetron (ZOFRAN) IV, mouth rinse, polyethylene glycol, witch hazel-glycerin   I  have personally reviewed following labs and imaging studies  LABORATORY DATA: CBC: Recent Labs  Lab 07/19/22 0237 07/19/22 2134 07/20/22 0423 07/22/22 0247 07/23/22 0327 07/25/22 0533  WBC 10.8*  --  11.1* 10.7* 8.3 8.1  NEUTROABS 6.0  --  6.7  --   --   --   HGB 7.0* 7.5* 7.6* 7.7* 7.9* 8.1*  HCT 20.5* 22.0* 22.4* 23.6* 24.1* 24.8*  MCV 92.8  --  92.2 94.4 93.1 94.7  PLT 285  --  272 243 220 [id number]    Basic Metabolic Panel: Recent Labs  Lab 07/21/22 0539 07/22/22 0247 07/23/22 0645 07/24/22 1009 07/25/22 0533  NA 137 136 137 138 139  K 3.8 3.7 3.8 4.3 4.0  CL 102 105 103 103 104  CO2 22 21* 22 21* 21*  GLUCOSE 104* 121* 107* 132* 91  BUN 60* 63* 63* 65* 62*  CREATININE 4.17* 3.89* 3.95* 3.84* 3.71*  CALCIUM 9.1 8.8* 9.1 9.3 9.6  MG  --  1.3* 1.8  --  1.9  PHOS  --  4.4 4.8* 4.9* 5.4*    GFR: Estimated Creatinine Clearance: 10.8 mL/min (A) (by C-G formula based on SCr of 3.71 mg/dL (H)).  Liver Function Tests: Recent Labs  Lab 07/19/22 0237 07/20/22 0423 07/21/22 0539 07/24/22 1009 07/25/22 0533  ALBUMIN 2.8* 2.8* 2.8* 3.0* 2.9*   No results for input(s): "LIPASE", "AMYLASE" in the last 168 hours. No results for input(s): "AMMONIA" in the last 168 hours.  Coagulation Profile: No results for input(s): "INR", "PROTIME" in the last 168 hours.  Cardiac  Enzymes: No results for input(s): "CKTOTAL", "CKMB", "CKMBINDEX", "TROPONINI" in the last 168 hours.  BNP (last 3 results) No results for input(s): "PROBNP" in the last 8760 hours.  Lipid Profile: No results for input(s): "CHOL", "HDL", "LDLCALC", "TRIG", "CHOLHDL", "LDLDIRECT" in the last 72 hours.  Thyroid Function Tests: No results for input(s): "TSH", "T4TOTAL", "FREET4", "T3FREE", "THYROIDAB" in the last 72 hours.  Anemia Panel: No results for input(s): "VITAMINB12", "FOLATE", "FERRITIN", "TIBC", "IRON", "RETICCTPCT" in the last 72 hours.  Urine analysis:    Component Value Date/Time   COLORURINE YELLOW 07/22/2022 0805   APPEARANCEUR HAZY (A) 07/22/2022 0805   LABSPEC 1.009 07/22/2022 0805   PHURINE 5.0 07/22/2022 0805   GLUCOSEU NEGATIVE 07/22/2022 0805   GLUCOSEU NEGATIVE 11/14/2018 1508   HGBUR MODERATE (A) 07/22/2022 0805   BILIRUBINUR NEGATIVE 07/22/2022 0805   KETONESUR NEGATIVE 07/22/2022 0805   PROTEINUR 100 (A) 07/22/2022 0805   UROBILINOGEN 0.2 11/14/2018 1508   NITRITE NEGATIVE 07/22/2022 0805   LEUKOCYTESUR TRACE (A) 07/22/2022 0805    Sepsis Labs: Lactic Acid, Venous    Component Value Date/Time   LATICACIDVEN 1.5 07/03/2022 0230    MICROBIOLOGY: No results found for this or any previous visit (from the past 240 hour(s)).  RADIOLOGY STUDIES/RESULTS: No results found.   LOS: 22 days   Oren Binet, MD  [facility]    To contact the attending provider between 7A-7P or the covering provider during after hours 7P-7A, please log into the web site [email or web address] and access using universal [place] password for that web site. If you do not have the password, please call the hospital operator.  07/25/2022, 9:51 AM

## 2022-07-25 NOTE — Progress Notes (Signed)
Seen and examined Full note to follow Sitting up in bedside chair-no complaints.  Creatinine continues to downtrend. At this time-she is medically stable to be discharged with close outpatient follow-up.  Discussed with son at bedside.  Will notify social worker regarding discharge plans.

## 2022-07-26 ENCOUNTER — Non-Acute Institutional Stay (SKILLED_NURSING_FACILITY): Payer: Medicare PPO | Admitting: Nurse Practitioner

## 2022-07-26 ENCOUNTER — Encounter: Payer: Self-pay | Admitting: Nurse Practitioner

## 2022-07-26 DIAGNOSIS — K219 Gastro-esophageal reflux disease without esophagitis: Secondary | ICD-10-CM | POA: Diagnosis not present

## 2022-07-26 DIAGNOSIS — N184 Chronic kidney disease, stage 4 (severe): Secondary | ICD-10-CM

## 2022-07-26 DIAGNOSIS — R609 Edema, unspecified: Secondary | ICD-10-CM | POA: Diagnosis not present

## 2022-07-26 DIAGNOSIS — I700 Atherosclerosis of aorta: Secondary | ICD-10-CM | POA: Diagnosis not present

## 2022-07-26 DIAGNOSIS — J432 Centrilobular emphysema: Secondary | ICD-10-CM | POA: Diagnosis not present

## 2022-07-26 DIAGNOSIS — M069 Rheumatoid arthritis, unspecified: Secondary | ICD-10-CM | POA: Diagnosis not present

## 2022-07-26 DIAGNOSIS — D631 Anemia in chronic kidney disease: Secondary | ICD-10-CM

## 2022-07-26 DIAGNOSIS — E039 Hypothyroidism, unspecified: Secondary | ICD-10-CM

## 2022-07-26 DIAGNOSIS — Z86711 Personal history of pulmonary embolism: Secondary | ICD-10-CM

## 2022-07-26 DIAGNOSIS — I10 Essential (primary) hypertension: Secondary | ICD-10-CM | POA: Diagnosis not present

## 2022-07-26 NOTE — Assessment & Plan Note (Signed)
s/p CABG x1, LIMA LAD for ostial LAD, EF wnl. S/p aneurysm repair, on Coreg, Statin

## 2022-07-26 NOTE — Assessment & Plan Note (Signed)
Bun/creat 62/3.71 07/25/22, EPO,  followed by Nephrology

## 2022-07-26 NOTE — Assessment & Plan Note (Signed)
Hospitalized 02/06/22-02/11/22 for acute PE R middle lobe,  on Eliquis now, treat with shortest course possible, dc Eliquis if bleeds again  DVT 01/28/22 [place] age indeterminate deep vein thrombosis  involving the right peroneal veins, on Eliquis

## 2022-07-26 NOTE — Progress Notes (Unsigned)
Location:   [facility] Room Number: 66B Place of Service:  SNF (31) Provider: Lennie Odor Jahziah Simonin NP  Plotnikov, [name], MD  Patient Care Team: Cassandria Anger, MD as PCP - General (Internal Medicine) Leonie Joann Jorge, MD as PCP - Cardiology (Cardiology) Leonie Ruthanna Macchia, MD as Consulting Physician (Cardiology) Gavin Pound, MD as Consulting Physician (Rheumatology) Armbruster, Carlota Raspberry, MD as Consulting Physician (Gastroenterology) Tanda Rockers, MD as Consulting Physician (Pulmonary Disease) Grace Isaac, MD (Inactive) as Consulting Physician (Cardiothoracic Surgery) Aloha Gell, MD as Consulting Physician (Obstetrics and Gynecology) Plotnikov, [name], MD Luretha Rued, RN as [place] Management  Extended Emergency Contact Information Primary Emergency Contact: [name] Address: [address]           [place], [place] [address] Johnnette Litter of [place] Phone: [phone] Mobile Phone: [phone] Relation: Spouse Secondary Emergency Contact: [facility] Address: [place], [place] [address] Johnnette Litter of [place] Phone: [phone] Relation: Son  Code Status: DNR Goals of care: Advanced Directive information    06/15/2022    2:15 PM  Advanced Directives  Does Patient Have a Medical Advance Directive? Yes  Type of Paramedic of [place];Living will  Copy of [place] in Chart? No - copy requested     Chief Complaint  Patient presents with   Acute Visit    Medication review following hospital stay.     HPI:  Pt is a 82 y.o. female seen today for an acute visit for medication review following hospital stay.    Hospitalized 07/02/22-07/25/22 for acute R pyelonephritis and sepsis, blood culture and urine culture positive E Coli, fully treated. The hospital stay was complicated with AKI, blood loss anemia-EGD3/9/24 10 gastric  ulcers (negative H pylori/malignancy), placed PPI bid x 6 wks, then daily.     Hospitalized 03/06/22-03/09/22, fell, left hip posterior dislocation, reduced in ER 03/06/22, followed by Ortho, left knee immobilizer use, SNF [facility] for therapy             Hospitalized 02/16/22-02/22/22 rectal bleed, external hemorrhoids, gb 6.1, PRBC x2 units transfused, f/u GI              Hospitalized 02/06/22-02/11/22 for acute PE R middle lobe,  on Eliquis now, treat with shortest course possible, dc Eliquis if bleeds again  DVT 01/28/22 [place] age indeterminate deep vein thrombosis  involving the right peroneal veins, on Eliquis             01/23/22 s/p total replacement of L hip, f/u Dr Erlinda Hong.             DOE/COPD, prn Albuterol ACT, Triamcinolone nasal spray, O2 2lpm  BRBPR hemorrhoids, Hydrocortisone cream, avoid constipation                          HTN, takes Coreg, off Amlodipine by nephrology.              HLD takes Atorvastatin, LDL 74 09/07/20             Vit D deficiency, takes Vit D             Anemia, take Vit B12, Fe, Hgb 8.1 07/25/22, EPO per Nephrology.              Constipation, stable  GERD/ EGD3/9/24 10 gastric ulcers (negative H pylori/malignancy), placed on PPI bid x 6 wks, then daily. prn Zofran             Hx of L GI diverticular bleed, on PPI             RA, takes Golimumab,  Leflunomide, Gabapentin, Hx of fx of knees, hands, wrists, cervical disc disease/fracture             Hypothyroidism, takes Levothyroxine, TSH 0.985 02/16/22             CAD s/p CABG x1, LIMA LAD for ostial LAD, EF wnl. S/p aneurysm repair, on Coreg, Statin             CKD, Bun/creat 62/3.71 07/25/22, EPO,  followed by Nephrology             TIA/Stroke, on Atorvastatin, Eliquis.              PMR, Hx of              Edema, BLE, off Furosemide, Kcl per nephrology.  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 Most likely scar. Mild subendocardial ischemia. EF 71% LOW RISK.    Cervical disc disease    fracture   Chronic kidney disease    COPD (chronic obstructive pulmonary disease) (HCC)    Diverticulosis    DVT (deep venous thrombosis) ([place])    RLE; Sept '23   History of DVT (deep vein thrombosis) 03/06/2022   History of pulmonary embolus (PE)    Oct '23   Hyperlipidemia    Hypertension    Hypothyroidism (acquired)    hypo   Myocardial infarction ([place]) 11/2014   TIA   Polymyalgia rheumatica ([place])    2011 Dr. Charlestine Night   Pulmonary emboli [facility]) 02/06/2022   01/2022 PE/DVT post-op. Lower GI bleed     Treat edema  Start Eliquis at the reduced dose due to Creat>1.5 and age >66   Rheumatoid arthritis ([place]) 2011   Dr.Truslow; fracture knees, hands and wrists -    S/P CABG x 1 1996   CABG--LIMA-LAD for ostial LAD (not felt to be PCI amenable). EF NORMAL then; LIMA now atretic   Shortness of breath dyspnea    with exertion   Stroke ([place]) 11/2014   TIA    Urinary frequency    Past Surgical History:  Procedure Laterality Date   ABDOMINAL AORTIC ANEURYSM REPAIR  [id number]   Complicated by mesenteric artery stenosis and splenic artery infarction with acquired Asplenia   APPENDECTOMY     BIOPSY  07/08/2022   Procedure: BIOPSY;  Surgeon: Yetta Flock, MD;  Location: [facility] ENDOSCOPY;  Service: Gastroenterology;;   Lillard Anes  07/2011   right  foot    CARDIAC CATHETERIZATION  2005   (Most recent CATH) - ostial LAD lesion 20-30% (down from 90% initially). Atretic LIMA. Minimal disease the RCA and Circumflex system.   CARPAL TUNNEL RELEASE Left    CATARACT EXTRACTION Bilateral    CERVICAL SPINE SURGERY     plate 2008 Dr. Saintclair Halsted   COLONOSCOPY     CORONARY ARTERY BYPASS GRAFT  1996   INCLUDED AN INTERNAL MAMMARY ARTERY TO THE LAD. EF WAS NORMAL   ESOPHAGOGASTRODUODENOSCOPY (EGD) WITH PROPOFOL N/A 07/08/2022   Procedure: ESOPHAGOGASTRODUODENOSCOPY (EGD) WITH PROPOFOL;  Surgeon: Yetta Flock, MD;  Location: [facility];  Service: Gastroenterology;  Laterality: N/A;   INGUINAL HERNIA REPAIR Right    LAPAROSCOPIC APPENDECTOMY N/A 06/28/2016   Procedure: APPENDECTOMY LAPAROSCOPIC;  Surgeon: Leighton Ruff, MD;  Location: [facility] ORS;  Service: General;  Laterality: N/A;   NM MYOVIEW LTD  06/2010   Fixed anteroseptal, apical and inferoapical defect with moderate size. Most likely scar. Mild subendocardial ischemia. EF 71% LOW RISK.    SPLENECTOMY     TOTAL HIP ARTHROPLASTY Left 01/23/2022   Procedure: LEFT TOTAL HIP ARTHROPLASTY ANTERIOR APPROACH;  Surgeon: Leandrew Koyanagi, MD;  Location: [facility];  Service: Orthopedics;  Laterality: Left;  3-C   TRANSTHORACIC ECHOCARDIOGRAM  12/2014   [facility]: Normal LV size & function. EF 55-60%,    vagina polyp     VIDEO ASSISTED THORACOSCOPY (VATS)/WEDGE RESECTION Left 03/02/2015   Procedure: VIDEO ASSISTED THORACOSCOPY (VATS), MINI THORACOTOMY, LEFT UPPER LOBE WEDGE, TAKE DOWN OF INTERNAL MAMMARY LESIONS, PLACEMENT OF ON-Q PUMP;  Surgeon: Grace Isaac, MD;  Location: [facility];  Service: Thoracic;  Laterality: Left;   VIDEO BRONCHOSCOPY N/A 03/02/2015   Procedure: BRONCHOSCOPY;  Surgeon: Grace Isaac, MD;  Location: [facility];  Service: Thoracic;  Laterality: N/A;   VIDEO BRONCHOSCOPY WITH ENDOBRONCHIAL NAVIGATION N/A 10/08/2017   Procedure: VIDEO BRONCHOSCOPY WITH ENDOBRONCHIAL NAVIGATION WITH  BIOPSIES OF LEFT UPPER LOBE AND LEFT LOWER LOBE;  Surgeon: Grace Isaac, MD;  Location: [facility];  Service: Thoracic;  Laterality: N/A;    Allergies  Allergen Reactions   Nsaids Other (See Comments)    Stomach upset Told to avoid due to Eliquis   Aspirin Other (See Comments)    Stomach upset Told to avoid due to Eliquis   Codeine Nausea And Vomiting   Nitrostat [Nitroglycerin] Other (See Comments)    Bradycardia. Drop in heart rate     Allergies as of 07/26/2022       Reactions   Nsaids Other (See Comments)   Stomach upset Told to avoid due to Eliquis   Aspirin Other (See Comments)   Stomach upset Told to avoid due to Eliquis   Codeine Nausea And Vomiting   Nitrostat [nitroglycerin] Other (See Comments)   Bradycardia. Drop in heart rate         Medication List        Accurate as of July 26, 2022 12:02 PM. If you have any questions, ask your nurse or doctor.          acetaminophen 325 MG tablet Commonly known as: TYLENOL Take 2 tablets (650 mg total) by mouth 2 (two) times daily as needed (generalized pain).   albuterol 108 (90 Base) MCG/ACT inhaler Commonly known as: VENTOLIN HFA Inhale 1-2 puffs into the lungs every 4 (four) hours as needed for wheezing or shortness of breath.   apixaban 2.5 MG Tabs tablet Commonly known as: ELIQUIS Take 1 tablet (  2.5 mg total) by mouth 2 (two) times daily.   ascorbic acid 500 MG tablet Commonly known as: VITAMIN C Take 1 tablet (500 mg total) by mouth daily.   atorvastatin 80 MG tablet Commonly known as: LIPITOR Take 1 tablet (80 mg total) by mouth daily.   carvedilol 3.125 MG tablet Commonly known as: COREG Take 1 tablet (3.125 mg total) by mouth 2 (two) times daily with a meal.   CENTRUM ADULT PO Take 1 tablet by mouth daily. 1 Tablet Daily.   Cyanocobalamin 2500 MCG Tabs Take 2,500 mcg by mouth daily.   docusate sodium 100 MG capsule Commonly known as: Colace Take 1-2 capsules (100-200 mg total) by  mouth daily as needed for mild constipation.   gabapentin 300 MG capsule Commonly known as: NEURONTIN Take 1 capsule (300 mg total) by mouth at bedtime.   Golimumab 50 MG/0.5ML Soaj Take 50 mg by mouth every 2 (two) months. Hold for the next 2 weeks per Orthopedic surgery recommendations.   leflunomide 20 MG tablet Commonly known as: ARAVA Take 1 tablet (20 mg total) by mouth daily.   levothyroxine 88 MCG tablet Commonly known as: SYNTHROID Take 1 tablet (88 mcg total) by mouth daily before breakfast.   pantoprazole 40 MG tablet Commonly known as: PROTONIX Take 1 tablet (40 mg total) by mouth 2 (two) times daily. Take twice daily x 6 weeks, then once daily thereafter.   polyethylene glycol 17 g packet Commonly known as: MIRALAX / GLYCOLAX Take 17 g by mouth daily as needed. What changed: reasons to take this   triamcinolone 55 MCG/ACT Aero nasal inhaler Commonly known as: NASACORT Place 1 spray into the nose daily.   Vitamin D3 50 MCG (2000 UT) Caps Generic drug: Cholecalciferol Take 1 capsule (2,000 Units total) by mouth daily.        Review of Systems  Constitutional:  Positive for fatigue. Negative for appetite change and fever.  HENT:  Negative for congestion and trouble swallowing.   Eyes:  Negative for visual disturbance.  Respiratory:  Positive for cough and shortness of breath. Negative for wheezing.        DOE  Cardiovascular:  Positive for leg swelling.  Gastrointestinal:  Negative for abdominal pain, blood in stool, constipation, nausea and vomiting.  Genitourinary:  Negative for difficulty urinating, dysuria and urgency.  Musculoskeletal:  Positive for arthralgias, back pain and gait problem. Negative for joint swelling.  Skin:  Negative for color change.  Neurological:  Negative for tremors and headaches.  Psychiatric/Behavioral:  Negative for behavioral problems and sleep disturbance. The patient is not nervous/anxious.     Immunization History   Administered Date(s) Administered   Fluad Quad(high Dose 65+) 02/03/2021, 01/16/2022   Influenza Whole 05/02/2001, 01/27/2008, 01/12/2010, 12/31/2010, 01/25/2012   Influenza, High Dose Seasonal PF 01/14/2014, 02/13/2015, 12/21/2016, 01/09/2018, 12/29/2018   Influenza,inj,Quad PF,6+ Mos 12/23/2015   Influenza-Unspecified 02/03/2013, 01/13/2014, 02/13/2015, 01/20/2020   Meningococcal polysaccharide vaccine (MPSV4) 03/21/2012   Moderna SARS-COV2 Booster Vaccination 03/15/2020   Moderna Sars-Covid-2 Vaccination 06/02/2019, 06/30/2019   PPD Test 11/18/2013   Pfizer Covid-19 Vaccine Bivalent Booster 6yrs & up 01/19/2021, 10/12/2021   Pneumococcal Conjugate-13 03/20/2013   Pneumococcal Polysaccharide-23 05/02/2000, 06/23/2010, 02/26/2018   Td 05/01/2006   Tdap 06/01/2016   Zoster Recombinat (Shingrix) 05/16/2017, 07/20/2017   Zoster, Live 01/17/2006   Pertinent  Health Maintenance Due  Topic Date Due   COLONOSCOPY (Pts 45-73yrs Insurance coverage will need to be confirmed)  03/30/2019   INFLUENZA VACCINE  Completed  DEXA SCAN  Completed      03/10/2022   11:15 AM 03/30/2022   10:46 AM 05/23/2022   10:48 AM 05/23/2022   10:58 AM 06/13/2022   10:42 PM  Fall Risk  Falls in the past year? 1 0 0 1 1  Was there an injury with Fall? 0 0  0 0  Fall Risk Category Calculator 1 0  1 1  Fall Risk Category (Retired) Low Low     (RETIRED) Patient Fall Risk Level High fall risk Low fall risk     Patient at Risk for Falls Due to History of fall(s);Impaired balance/gait No Fall Risks No Fall Risks Impaired balance/gait;History of fall(s)   Fall risk Follow up Falls evaluation completed Falls evaluation completed Falls evaluation completed Falls evaluation completed    Functional Status Survey:    Vitals:   07/26/22 1118  BP: (!) 150/72  Pulse: 84  Resp: 16  Temp: (!) 97.5 F (36.4 C)  SpO2: 92%  Weight: 161 lb (73 kg)   Body mass index is 28.52 kg/m. Physical Exam Constitutional:       Comments: Exhausted easily.   HENT:     Head: Normocephalic and atraumatic.     Nose: Nose normal.     Mouth/Throat:     Mouth: Mucous membranes are moist.  Eyes:     Extraocular Movements: Extraocular movements intact.     Conjunctiva/sclera: Conjunctivae normal.     Pupils: Pupils are equal, round, and reactive to light.  Cardiovascular:     Rate and Rhythm: Normal rate and regular rhythm.     Heart sounds: No murmur heard. Pulmonary:     Effort: Pulmonary effort is normal.     Breath sounds: No wheezing, rhonchi or rales.  Abdominal:     General: Bowel sounds are normal.     Palpations: Abdomen is soft.     Tenderness: There is no abdominal tenderness.  Genitourinary:    Comments: External hemorrhoids from previous examination.  Musculoskeletal:     Cervical back: Normal range of motion and neck supple.     Right lower leg: Edema present.     Left lower leg: Edema present.     Comments: Moderate edema BLE L>R  Skin:    General: Skin is warm and dry.     Comments: L hip surgical scar  Neurological:     General: No focal deficit present.     Mental Status: She is alert and oriented to person, place, and time. Mental status is at baseline.     Motor: No weakness.     Gait: Gait abnormal.  Psychiatric:        Mood and Affect: Mood normal.        Behavior: Behavior normal.        Thought Content: Thought content normal.     Labs reviewed: Recent Labs    07/22/22 0247 07/23/22 0645 07/24/22 1009 07/25/22 0533  NA 136 137 138 139  K 3.7 3.8 4.3 4.0  CL 105 103 103 104  CO2 21* 22 21* 21*  GLUCOSE 121* 107* 132* 91  BUN 63* 63* 65* 62*  CREATININE 3.89* 3.95* 3.84* 3.71*  CALCIUM 8.8* 9.1 9.3 9.6  MG 1.3* 1.8  --  1.9  PHOS 4.4 4.8* 4.9* 5.4*   Recent Labs    07/03/22 0033 07/04/22 0709 07/14/22 0747 07/19/22 0237 07/21/22 0539 07/24/22 1009 07/25/22 0533  AST 38 25 13*  --   --   --   --  ALT 20 17 5   --   --   --   --   ALKPHOS 95 93 88  --   --    --   --   BILITOT 1.0 0.7 0.9  --   --   --   --   PROT 5.4* 5.4* 6.3*  --   --   --   --   ALBUMIN 2.5* 2.2* 3.4*   < > 2.8* 3.0* 2.9*   < > = values in this interval not displayed.   Recent Labs    07/18/22 0337 07/19/22 0237 07/19/22 2134 07/20/22 0423 07/22/22 0247 07/23/22 0327 07/25/22 0533  WBC 11.6* 10.8*  --  11.1* 10.7* 8.3 8.1  NEUTROABS 6.5 6.0  --  6.7  --   --   --   HGB 7.1* 7.0*   < > 7.6* 7.7* 7.9* 8.1*  HCT 21.4* 20.5*   < > 22.4* 23.6* 24.1* 24.8*  MCV 93.0 92.8  --  92.2 94.4 93.1 94.7  PLT 327 285  --  272 243 220 321   < > = values in this interval not displayed.   Lab Results  Component Value Date   TSH 0.985 02/16/2022   No results found for: "HGBA1C" Lab Results  Component Value Date   CHOL 157 09/07/2020   HDL 59.30 09/07/2020   LDLCALC 74 09/07/2020   LDLDIRECT 162.1 10/28/2009   TRIG 121.0 09/07/2020   CHOLHDL 3 09/07/2020    Significant Diagnostic Results in last 30 days:  DG CHEST PORT 1 VIEW  Result Date: 07/15/2022 CLINICAL DATA:  Hypoxia EXAM: PORTABLE CHEST 1 VIEW COMPARISON:  07/11/2022 FINDINGS: Prior CABG. Heart is borderline in size. Mediastinal contours within normal limits. Bilateral lower lobe airspace opacities have increased since prior study. Small bilateral effusions. No acute bony abnormality. IMPRESSION: Worsening bibasilar atelectasis or infiltrates with small effusions. Electronically Signed   By: Rolm Baptise M.D.   On: 07/15/2022 20:13   DG CHEST PORT 1 VIEW  Result Date: 07/11/2022 CLINICAL DATA:  Hypoxia EXAM: PORTABLE CHEST 1 VIEW COMPARISON:  CXR 07/08/22 FINDINGS: Small bilateral pleural effusions. Status post median sternotomy. No pneumothorax. Bibasilar airspace opacities which could represent atelectasis or infection. Cardiac and mediastinal contours are unchanged. No radiographically apparent displaced rib fractures. Visualized upper abdomen is unremarkable. IMPRESSION: 1. Small bilateral pleural effusions. 2.  Redemonstrated bibasilar airspace opacities, which could represent atelectasis or infection. Electronically Signed   By: Marin Roberts M.D.   On: 07/11/2022 16:55   US RENAL  Result Date: 07/11/2022 CLINICAL DATA:  Acute kidney injury in 82 year old female. EXAM: RENAL / URINARY TRACT ULTRASOUND COMPLETE COMPARISON:  MRI from November 10, 2021 FINDINGS: Right Kidney: Renal measurements: 9.6 x 4.1 x 4.2 cm = volume: 88 mL. No signs of hydronephrosis. Increased cortical echogenicity with marked renal cortical/parenchymal scarring. Cysts of the RIGHT kidney largest 3.9 x 4.7 x 3.6 cm in the upper pole with several smaller cysts not substantially changed compared to previous imaging for which no additional dedicated follow-up imaging is recommend. Left Kidney: Renal measurements: 9.8 x 4.5 x 4.4 cm = volume: 103 mL. Cysts on the LEFT as well largest 2.5 x 1.8 x 2.1 cm for which no additional dedicated follow-up imaging is recommended. Increased cortical echogenicity and parenchymal scarring is very similar to the recent/prior MRI evaluation. There is no hydronephrosis. Bladder: Urinary bladder is decompressed not well evaluated. Other: None. IMPRESSION: 1. No signs of hydronephrosis. 2. Increased cortical echogenicity and  parenchymal scarring in both kidneys as can be seen in the setting of chronic medical renal disease. Electronically Signed   By: Zetta Bills M.D.   On: 07/11/2022 09:05   DG CHEST PORT 1 VIEW  Result Date: 07/08/2022 CLINICAL DATA:  Shortness of breath. EXAM: PORTABLE CHEST 1 VIEW COMPARISON:  07/02/2022 FINDINGS: Median sternotomy and CABG. The heart size is normal. There are small bilateral pleural effusions. There is new bibasilar patchy opacity. IMPRESSION: 1. Small bilateral pleural effusions. 2. New bibasilar patchy opacities and cardiomegaly. Findings are consistent with pulmonary edema or infectious cause. Electronically Signed   By: Nolon Nations M.D.   On: 07/08/2022 11:38   DG  Chest Portable 1 View  Result Date: 07/03/2022 CLINICAL DATA:  Altered mental status EXAM: PORTABLE CHEST 1 VIEW COMPARISON:  03/06/2022 FINDINGS: Prior CABG. Heart is borderline in size. Bibasilar linear atelectasis or scarring. No effusions or edema. No acute bony abnormality. IMPRESSION: Bibasilar atelectasis or scarring. Electronically Signed   By: Rolm Baptise M.D.   On: 07/03/2022 00:16   CT HEAD WO CONTRAST (5MM)  Result Date: 07/02/2022 CLINICAL DATA:  Weakness for 2 days with left facial droop EXAM: CT HEAD WITHOUT CONTRAST TECHNIQUE: Contiguous axial images were obtained from the base of the skull through the vertex without intravenous contrast. RADIATION DOSE REDUCTION: This exam was performed according to the departmental dose-optimization program which includes automated exposure control, adjustment of the mA and/or kV according to patient size and/or use of iterative reconstruction technique. COMPARISON:  02/06/2022 FINDINGS: Brain: No evidence of acute infarction, hemorrhage, hydrocephalus, extra-axial collection or mass lesion/mass effect. Mild atrophic changes and chronic white matter ischemic changes are noted. Vascular: No hyperdense vessel or unexpected calcification. Skull: Normal. Negative for fracture or focal lesion. Sinuses/Orbits: No acute finding. Other: None. IMPRESSION: Chronic atrophic and ischemic changes.  No acute abnormality noted. Electronically Signed   By: Inez Catalina M.D.   On: 07/02/2022 23:45    Assessment/Plan: COPD (chronic obstructive pulmonary disease) (HCC) prn Albuterol ACT, Triamcinolone nasal spray, O2 2lpm to maintain SatO2>90%  History of pulmonary embolism  Hospitalized 02/06/22-02/11/22 for acute PE R middle lobe,  on Eliquis now, treat with shortest course possible, dc Eliquis if bleeds again  DVT 01/28/22 [place] age indeterminate deep vein thrombosis  involving the right peroneal veins, on Eliquis  GERD (gastroesophageal reflux disease) GERD/ EGD3/9/24  10 gastric ulcers (negative H pylori/malignancy), placed on PPI bid x 6 wks, then daily. prn Zofran  CKD (chronic kidney disease) stage 4, GFR 15-29 ml/min (HCC) - baseline SCr 2.5. follows with Dr. Royce Macadamia with [facility] 62/3.71 07/25/22, EPO,  followed by Nephrology  Edema BLE, off Furosemide, Kcl per nephrology.   Aortic atherosclerosis (HCC) s/p CABG x1, LIMA LAD for ostial LAD, EF wnl. S/p aneurysm repair, on Coreg, Statin  Acquired hypothyroidism takes Levothyroxine, TSH 0.985 02/16/22, update TSH  Rheumatoid arthritis (HCC) takes Golimumab,  Leflunomide, Gabapentin, Hx of fx of knees, hands, wrists, cervical disc disease/fracture  Anemia take Vit B12, Fe, Hgb 8.1 07/25/22, EPO per Nephrology in the past, chronic kidney disease anemia+ blood loss anemia( hemorrhagic gastric ulcers)  Essential hypertension Blood pressure is controlled, takes Coreg, off Amlodipine by nephrology.     Family/ staff Communication: plan of care reviewed with the patient, the patient's husband, the patient's son, and charge nurse   Labs/tests ordered:    Time spend 35 minutes.

## 2022-07-26 NOTE — Assessment & Plan Note (Signed)
prn Albuterol ACT, Triamcinolone nasal spray, O2 2lpm to maintain SatO2>90%

## 2022-07-26 NOTE — Assessment & Plan Note (Signed)
BLE, off Furosemide, Kcl per nephrology.

## 2022-07-26 NOTE — Assessment & Plan Note (Signed)
take Vit B12, Fe, Hgb 8.1 07/25/22, EPO per Nephrology in the past, chronic kidney disease anemia+ blood loss anemia( hemorrhagic gastric ulcers)

## 2022-07-26 NOTE — Assessment & Plan Note (Signed)
Blood pressure is controlled, takes Coreg, off Amlodipine by nephrology.  

## 2022-07-26 NOTE — Assessment & Plan Note (Signed)
GERD/ EGD3/9/24 10 gastric ulcers (negative H pylori/malignancy), placed on PPI bid x 6 wks, then daily. prn Zofran

## 2022-07-26 NOTE — Assessment & Plan Note (Signed)
takes Levothyroxine, TSH 0.985 02/16/22, update TSH

## 2022-07-26 NOTE — Consult Note (Signed)
   [facility] Inpatient Consult   07/26/2022  KAYTLINN OPSAHL 1940/08/28 [id number]  [place] [facility] [ACO] Patient: [name]  Follow up note:  Fast appeal won  Primary Care Provider:  Plotnikov, [name], MD   Plan:   [facility]  RN CC of SNF approval   transitional care needs for returning from post facility care coordination needs to return to community.  For questions or referrals, please contact:   Natividad Brood, RN BSN [place]  [phone] business mobile phone Toll free office [phone]  *[place]  [phone] Fax number: [phone] [email or web address]

## 2022-07-26 NOTE — Assessment & Plan Note (Signed)
takes Golimumab,  Leflunomide, Gabapentin, Hx of fx of knees, hands, wrists, cervical disc disease/fracture 

## 2022-07-27 ENCOUNTER — Non-Acute Institutional Stay (SKILLED_NURSING_FACILITY): Payer: Medicare PPO | Admitting: Family Medicine

## 2022-07-27 DIAGNOSIS — J432 Centrilobular emphysema: Secondary | ICD-10-CM | POA: Diagnosis not present

## 2022-07-27 DIAGNOSIS — R609 Edema, unspecified: Secondary | ICD-10-CM | POA: Diagnosis not present

## 2022-07-27 DIAGNOSIS — N10 Acute tubulo-interstitial nephritis: Secondary | ICD-10-CM | POA: Diagnosis not present

## 2022-07-27 DIAGNOSIS — R531 Weakness: Secondary | ICD-10-CM | POA: Diagnosis not present

## 2022-07-27 DIAGNOSIS — N179 Acute kidney failure, unspecified: Secondary | ICD-10-CM | POA: Diagnosis not present

## 2022-07-27 DIAGNOSIS — R195 Other fecal abnormalities: Secondary | ICD-10-CM

## 2022-07-27 DIAGNOSIS — N184 Chronic kidney disease, stage 4 (severe): Secondary | ICD-10-CM

## 2022-07-27 DIAGNOSIS — I10 Essential (primary) hypertension: Secondary | ICD-10-CM

## 2022-07-27 DIAGNOSIS — I700 Atherosclerosis of aorta: Secondary | ICD-10-CM | POA: Diagnosis not present

## 2022-07-27 NOTE — Progress Notes (Signed)
Provider:  Alain Honey, MD Location:      Place of Service:     PCP: Cassandria Anger, MD Patient Care Team: Cassandria Anger, MD as PCP - General (Internal Medicine) Leonie Man, MD as PCP - Cardiology (Cardiology) Leonie Man, MD as Consulting Physician (Cardiology) Gavin Pound, MD as Consulting Physician (Rheumatology) Armbruster, Carlota Raspberry, MD as Consulting Physician (Gastroenterology) Tanda Rockers, MD as Consulting Physician (Pulmonary Disease) Grace Isaac, MD (Inactive) as Consulting Physician (Cardiothoracic Surgery) Aloha Gell, MD as Consulting Physician (Obstetrics and Gynecology) Plotnikov, [name], MD Luretha Rued, RN as [place] Management  Extended Emergency Contact Information Primary Emergency Contact: [name] Address: [address]           [place], [place] [address] Johnnette Litter of [place] Phone: [phone] Mobile Phone: [phone] Relation: Spouse Secondary Emergency Contact: [facility] Address: [place], [place] [address] Johnnette Litter of [place] Mobile Phone: [phone] Relation: Son  Code Status:  Goals of Care: Advanced Directive information    06/15/2022    2:15 PM  Advanced Directives  Does Patient Have a Medical Advance Directive? Yes  Type of Paramedic of [place];Living will  Copy of [place] in Chart? No - copy requested      No chief complaint on file.   HPI: Patient is a 82 y.o. female seen today for admission to [facility] after a prolonged hospitalization from July 02, 2022 through July 25, 2022.  It was complicated by GI bleeding with acute blood loss and acute kidney injury patient was initially presented to the hospital with fever and generalized weakness and found to have sepsis due to pyelonephritis.  This was treated with hospital course, hospital course was  complicated by acute blood loss.  Endoscopy but he subsequently revealed gastric ulcers and nonbleeding duodenal ulcer.  Biopsies were negative for H. pylori or malignancy.  She is to be treated with PPI twice daily for 6 weeks.  She also has a history of COPD.  This improved during hospitalization with pulmonary toileting.  He was seen in consultation by nephrology with intentions to follow her as outpatient.  They recommended holding of diuretics at time of discharge as her edema had improved spontaneously. 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 Most likely scar. Mild subendocardial ischemia. EF 71% LOW RISK.    Cervical disc disease    fracture   Chronic kidney disease    COPD (chronic obstructive pulmonary disease) (HCC)    Diverticulosis    DVT (deep venous thrombosis) (  [place])    RLE; Sept '23   History of DVT (deep vein thrombosis) 03/06/2022   History of pulmonary embolus (PE)    Oct '23   Hyperlipidemia    Hypertension    Hypothyroidism (acquired)    hypo   Myocardial infarction ([place]) 11/2014   TIA   Polymyalgia rheumatica ([place])    2011 Dr. Charlestine Night   Pulmonary emboli [facility]) 02/06/2022   01/2022 PE/DVT post-op. Lower GI bleed     Treat edema  Start Eliquis at the  reduced dose due to Creat>1.5 and age >89   Rheumatoid arthritis ([place]) 2011   Dr.Truslow; fracture knees, hands and wrists -    S/P CABG x 1 1996   CABG--LIMA-LAD for ostial LAD (not felt to be PCI amenable). EF NORMAL then; LIMA now atretic   Shortness of breath dyspnea    with exertion   Stroke ([place]) 11/2014   TIA    Urinary frequency    Past Surgical History:  Procedure Laterality Date   ABDOMINAL AORTIC ANEURYSM REPAIR  [id number]   Complicated by mesenteric artery stenosis and splenic artery infarction with acquired Asplenia   APPENDECTOMY     BIOPSY  07/08/2022   Procedure: BIOPSY;  Surgeon: Yetta Flock, MD;  [facility]: [facility] ENDOSCOPY;  Service: Gastroenterology;;   Lillard Anes  07/2011   right foot   CARDIAC CATHETERIZATION  2005   (Most recent CATH) - ostial LAD lesion 20-30% (down from 90% initially). Atretic LIMA. Minimal disease the RCA and Circumflex system.   CARPAL TUNNEL RELEASE Left    CATARACT EXTRACTION Bilateral    CERVICAL SPINE SURGERY     plate 2008 Dr. Saintclair Halsted   COLONOSCOPY     CORONARY ARTERY BYPASS GRAFT  1996   INCLUDED AN INTERNAL MAMMARY ARTERY TO THE LAD. EF WAS NORMAL   ESOPHAGOGASTRODUODENOSCOPY (EGD) WITH PROPOFOL N/A 07/08/2022   Procedure: ESOPHAGOGASTRODUODENOSCOPY (EGD) WITH PROPOFOL;  Surgeon: Yetta Flock, MD;  Location: [place];  Service: Gastroenterology;  Laterality: N/A;   INGUINAL HERNIA REPAIR Right    LAPAROSCOPIC APPENDECTOMY N/A 06/28/2016   Procedure: APPENDECTOMY LAPAROSCOPIC;  Surgeon: Leighton Ruff, MD;  Location: [facility] ORS;  Service: General;  Laterality: N/A;   NM MYOVIEW LTD  06/2010   Fixed anteroseptal, apical and inferoapical defect with moderate size. Most likely scar. Mild subendocardial ischemia. EF 71% LOW RISK.    SPLENECTOMY     TOTAL HIP ARTHROPLASTY Left 01/23/2022   Procedure: LEFT TOTAL HIP ARTHROPLASTY ANTERIOR APPROACH;  Surgeon: Leandrew Koyanagi, MD;  Location: [place];  Service: Orthopedics;  Laterality: Left;   3-C   TRANSTHORACIC ECHOCARDIOGRAM  12/2014   [facility]: Normal LV size & function. EF 55-60%,    vagina polyp     VIDEO ASSISTED THORACOSCOPY (VATS)/WEDGE RESECTION Left 03/02/2015   Procedure: VIDEO ASSISTED THORACOSCOPY (VATS), MINI THORACOTOMY, LEFT UPPER LOBE WEDGE, TAKE DOWN OF INTERNAL MAMMARY LESIONS, PLACEMENT OF ON-Q PUMP;  Surgeon: Grace Isaac, MD;  Location: [place];  Service: Thoracic;  Laterality: Left;   VIDEO BRONCHOSCOPY N/A 03/02/2015   Procedure: BRONCHOSCOPY;  Surgeon: Grace Isaac, MD;  Location: [place];  Service: Thoracic;  Laterality: N/A;   VIDEO BRONCHOSCOPY WITH ENDOBRONCHIAL NAVIGATION N/A 10/08/2017   Procedure: VIDEO BRONCHOSCOPY WITH ENDOBRONCHIAL NAVIGATION WITH BIOPSIES OF LEFT UPPER LOBE AND LEFT LOWER LOBE;  Surgeon: Grace Isaac, MD;  Location: [place];  Service: Thoracic;  Laterality: N/A;    reports that she quit smoking about 63 years ago. Her  smoking use included cigarettes. She has never been exposed to tobacco smoke. She has never used smokeless tobacco. She reports current alcohol use. She reports that she does not use drugs. Social History   Socioeconomic History   Marital status: Married    Spouse name: Not on file   Number of children: 4   Years of education: Not on file   Highest education level: Not on file  Occupational History   Occupation: retired    Fish farm manager: RETIRED  Tobacco Use   Smoking status: Former    Types: Cigarettes    Quit date: 12/04/1958    Years since quitting: 63.6    Passive exposure: Never   Smokeless tobacco: Never  Vaping Use   Vaping Use: Never used  Substance and Sexual Activity   Alcohol use: Yes    Comment: hardly ever   Drug use: No   Sexual activity: Not Currently  Other Topics Concern   Not on file  Social History Narrative   [facility]  [place] ; [address] [place].    Living at [facility] since dec 2015   Social Determinants of Health    Financial Resource Strain: Low Risk  (06/13/2022)   Overall Financial Resource Strain (CARDIA)    Difficulty of Paying Living Expenses: Not hard at all  Food Insecurity: No Food Insecurity (06/13/2022)   Hunger Vital Sign    Worried About Running Out of Food in the Last Year: Never true    Ran Out of Food in the Last Year: Never true  Transportation Needs: No Transportation Needs (06/13/2022)   PRAPARE - Hydrologist (Medical): No    Lack of Transportation (Non-Medical): No  Physical Activity: Inactive (06/13/2022)   Exercise Vital Sign    Days of Exercise per Week: 0 days    Minutes of Exercise per Session: 0 min  Stress: No Stress Concern Present (06/13/2022)   [place]    Feeling of Stress : Only a little  Social Connections: Unknown (06/13/2022)   Social Connection and Isolation Panel [NHANES]    Frequency of Communication with Friends and Family: Once a week    Frequency of Social Gatherings with Friends and Family: Once a week    Attends Religious Services: Not on Advertising copywriter or Organizations: Yes    Attends Archivist Meetings: More than 4 times per year    Marital Status: Married  Human resources officer Violence: Not At Risk (06/15/2022)   Humiliation, Afraid, Rape, and Kick questionnaire    Fear of Current or Ex-Partner: No    Emotionally Abused: No    Physically Abused: No    Sexually Abused: No    Functional Status Survey:    Family History  Problem Relation Age of Onset   Hypertension Mother    Diabetes Mother    Heart disease Mother    Hyperlipidemia Mother    Stroke Father    Hyperlipidemia Sister    Hypertension Sister    Hypertension Daughter    Cancer Paternal Uncle        Deceased from cancer not sure of site   Hypertension Son    Hyperlipidemia Son    Hyperlipidemia Son    Hypertension Son    Hyperlipidemia Son    Hypertension Son     Hypertension Other    Coronary artery disease Other  Asthma Neg Hx    Colon cancer Neg Hx     Health Maintenance  Topic Date Due   COLONOSCOPY (Pts 45-31yrs Insurance coverage will need to be confirmed)  03/30/2019   COVID-19 Vaccine (6 - 2023-24 season) 08/04/2022   Medicare Annual Wellness (AWV)  06/16/2023   DTaP/Tdap/Td (3 - Td or Tdap) 06/01/2026   Pneumonia Vaccine 64+ Years old  Completed   INFLUENZA VACCINE  Completed   DEXA SCAN  Completed   Zoster Vaccines- Shingrix  Completed   HPV VACCINES  Aged Out    Allergies  Allergen Reactions   Nsaids Other (See Comments)    Stomach upset Told to avoid due to Eliquis   Aspirin Other (See Comments)    Stomach upset Told to avoid due to Eliquis   Codeine Nausea And Vomiting   Nitrostat [Nitroglycerin] Other (See Comments)    Bradycardia. Drop in heart rate     Outpatient Encounter Medications as of 07/27/2022  Medication Sig   acetaminophen (TYLENOL) 325 MG tablet Take 2 tablets (650 mg total) by mouth 2 (two) times daily as needed (generalized pain).   albuterol (VENTOLIN HFA) 108 (90 Base) MCG/ACT inhaler Inhale 1-2 puffs into the lungs every 4 (four) hours as needed for wheezing or shortness of breath.   apixaban (ELIQUIS) 2.5 MG TABS tablet Take 1 tablet (2.5 mg total) by mouth 2 (two) times daily.   ascorbic acid (VITAMIN C) 500 MG tablet Take 1 tablet (500 mg total) by mouth daily.   atorvastatin (LIPITOR) 80 MG tablet Take 1 tablet (80 mg total) by mouth daily.   carvedilol (COREG) 3.125 MG tablet Take 1 tablet (3.125 mg total) by mouth 2 (two) times daily with a meal.   Cholecalciferol (VITAMIN D3) 50 MCG (2000 UT) capsule Take 1 capsule (2,000 Units total) by mouth daily.   Cyanocobalamin 2500 MCG TABS Take 2,500 mcg by mouth daily.   docusate sodium (COLACE) 100 MG capsule Take 1-2 capsules (100-200 mg total) by mouth daily as needed for mild constipation.   gabapentin (NEURONTIN) 300 MG capsule Take 1 capsule  (300 mg total) by mouth at bedtime.   Golimumab 50 MG/0.5ML SOAJ Take 50 mg by mouth every 2 (two) months. Hold for the next 2 weeks per Orthopedic surgery recommendations.   leflunomide (ARAVA) 20 MG tablet Take 1 tablet (20 mg total) by mouth daily.   levothyroxine (SYNTHROID) 88 MCG tablet Take 1 tablet (88 mcg total) by mouth daily before breakfast.   Multiple Vitamins-Minerals (CENTRUM ADULT PO) Take 1 tablet by mouth daily. 1 Tablet Daily.   pantoprazole (PROTONIX) 40 MG tablet Take 1 tablet (40 mg total) by mouth 2 (two) times daily. Take twice daily x 6 weeks, then once daily thereafter.   polyethylene glycol (MIRALAX / GLYCOLAX) 17 g packet Take 17 g by mouth daily as needed. (Patient taking differently: Take 17 g by mouth daily as needed for mild constipation.)   triamcinolone (NASACORT) 55 MCG/ACT AERO nasal inhaler Place 1 spray into the nose daily.   No facility-administered encounter medications on file as of 07/27/2022.    Review of Systems  Constitutional:  Positive for activity change and fever.  HENT: Negative.    Respiratory:  Positive for shortness of breath.   Cardiovascular:  Positive for leg swelling.  Gastrointestinal:  Positive for blood in stool.  Genitourinary: Negative.   Neurological: Negative.   All other systems reviewed and are negative.   There were no vitals filed for this  visit. There is no height or weight on file to calculate BMI. Physical Exam Vitals reviewed.  Constitutional:      Appearance: Normal appearance.  HENT:     Head: Normocephalic.     Mouth/Throat:     Mouth: Mucous membranes are moist.     Pharynx: Oropharynx is clear.  Eyes:     Extraocular Movements: Extraocular movements intact.     Pupils: Pupils are equal, round, and reactive to light.  Cardiovascular:     Rate and Rhythm: Normal rate.  Pulmonary:     Effort: Pulmonary effort is normal.     Breath sounds: Normal breath sounds.  Abdominal:     General: Bowel sounds are  normal.     Palpations: Abdomen is soft.  Musculoskeletal:     Cervical back: Normal range of motion.     Right lower leg: Edema present.     Left lower leg: Edema present.  Skin:    General: Skin is warm.  Neurological:     General: No focal deficit present.     Mental Status: She is alert and oriented to person, place, and time.  Psychiatric:        Mood and Affect: Mood normal.        Behavior: Behavior normal.     Labs reviewed: Basic Metabolic Panel: Recent Labs    07/22/22 0247 07/23/22 0645 07/24/22 1009 07/25/22 0533  NA 136 137 138 139  K 3.7 3.8 4.3 4.0  CL 105 103 103 104  CO2 21* 22 21* 21*  GLUCOSE 121* 107* 132* 91  BUN 63* 63* 65* 62*  CREATININE 3.89* 3.95* 3.84* 3.71*  CALCIUM 8.8* 9.1 9.3 9.6  MG 1.3* 1.8  --  1.9  PHOS 4.4 4.8* 4.9* 5.4*   Liver Function Tests: Recent Labs    07/03/22 0033 07/04/22 0709 07/14/22 0747 07/19/22 0237 07/21/22 0539 07/24/22 1009 07/25/22 0533  AST 38 25 13*  --   --   --   --   ALT 20 17 5   --   --   --   --   ALKPHOS 95 93 88  --   --   --   --   BILITOT 1.0 0.7 0.9  --   --   --   --   PROT 5.4* 5.4* 6.3*  --   --   --   --   ALBUMIN 2.5* 2.2* 3.4*   < > 2.8* 3.0* 2.9*   < > = values in this interval not displayed.   No results for input(s): "LIPASE", "AMYLASE" in the last 8760 hours. No results for input(s): "AMMONIA" in the last 8760 hours. CBC: Recent Labs    07/18/22 0337 07/19/22 0237 07/19/22 2134 07/20/22 0423 07/22/22 0247 07/23/22 0327 07/25/22 0533  WBC 11.6* 10.8*  --  11.1* 10.7* 8.3 8.1  NEUTROABS 6.5 6.0  --  6.7  --   --   --   HGB 7.1* 7.0*   < > 7.6* 7.7* 7.9* 8.1*  HCT 21.4* 20.5*   < > 22.4* 23.6* 24.1* 24.8*  MCV 93.0 92.8  --  92.2 94.4 93.1 94.7  PLT 327 285  --  272 243 220 321   < > = values in this interval not displayed.   Cardiac Enzymes: No results for input(s): "CKTOTAL", "CKMB", "CKMBINDEX", "TROPONINI" in the last 8760 hours. BNP: Invalid input(s):  "POCBNP" No results found for: "HGBA1C" Lab Results  Component Value Date  TSH 0.985 02/16/2022   Lab Results  Component Value Date   VITAMINB12 2,000 02/14/2022   Lab Results  Component Value Date   FOLATE 11.5 09/04/2012   Lab Results  Component Value Date   IRON 128 06/01/2022   TIBC 269 06/01/2022   FERRITIN 368 (H) 06/01/2022    Imaging and Procedures obtained prior to SNF admission: DG Chest Portable 1 View  Result Date: 07/03/2022 CLINICAL DATA:  Altered mental status EXAM: PORTABLE CHEST 1 VIEW COMPARISON:  03/06/2022 FINDINGS: Prior CABG. Heart is borderline in size. Bibasilar linear atelectasis or scarring. No effusions or edema. No acute bony abnormality. IMPRESSION: Bibasilar atelectasis or scarring. Electronically Signed   By: Rolm Baptise M.D.   On: 07/03/2022 00:16   CT HEAD WO CONTRAST (5MM)  Result Date: 07/02/2022 CLINICAL DATA:  Weakness for 2 days with left facial droop EXAM: CT HEAD WITHOUT CONTRAST TECHNIQUE: Contiguous axial images were obtained from the base of the skull through the vertex without intravenous contrast. RADIATION DOSE REDUCTION: This exam was performed according to the departmental dose-optimization program which includes automated exposure control, adjustment of the mA and/or kV according to patient size and/or use of iterative reconstruction technique. COMPARISON:  02/06/2022 FINDINGS: Brain: No evidence of acute infarction, hemorrhage, hydrocephalus, extra-axial collection or mass lesion/mass effect. Mild atrophic changes and chronic white matter ischemic changes are noted. Vascular: No hyperdense vessel or unexpected calcification. Skull: Normal. Negative for fracture or focal lesion. Sinuses/Orbits: No acute finding. Other: None. IMPRESSION: Chronic atrophic and ischemic changes.  No acute abnormality noted. Electronically Signed   By: Inez Catalina M.D.   On: 07/02/2022 23:45    Assessment/Plan  1. Acute pyelonephritis Infection  complicated by sepsis was successfully treated with antibiotic  2. Acute renal failure superimposed on stage 4 chronic kidney disease, unspecified acute renal failure type ([place]) Followed by nephrology with plans to continue following in upcoming days  3. Aortic atherosclerosis ([place]) Managed with control of blood pressure and lipids with atorvastatin at 80 mg  4. Centrilobular emphysema ([place]) She is on albuterol rescue inhaler  5. Dark stools Resulted from multiple gastric ulcers.  Patient treated with PPIs  6. Edema, unspecified type Likely related to CKD.  Nephrologist recommended holding further diuretics until I see her.  She does have stage IV CKD  7. Essential hypertension Blood pressure today 132/74.  On carvedilol  8. Generalized weakness After prolonged hospitalization patient is weak.  Goal is for physical therapy to improve strength and return her to independent apartment    Family/ staff Communication:   Labs/tests ordered:  Lillette Boxer. Sabra Heck, [place] [address] [place], [place] Office [phone]

## 2022-07-27 NOTE — Progress Notes (Deleted)
.  smm 

## 2022-08-01 ENCOUNTER — Encounter: Payer: Self-pay | Admitting: Nurse Practitioner

## 2022-08-01 ENCOUNTER — Non-Acute Institutional Stay (SKILLED_NURSING_FACILITY): Payer: Medicare PPO | Admitting: Nurse Practitioner

## 2022-08-01 DIAGNOSIS — N184 Chronic kidney disease, stage 4 (severe): Secondary | ICD-10-CM

## 2022-08-01 DIAGNOSIS — K219 Gastro-esophageal reflux disease without esophagitis: Secondary | ICD-10-CM

## 2022-08-01 DIAGNOSIS — I700 Atherosclerosis of aorta: Secondary | ICD-10-CM

## 2022-08-01 DIAGNOSIS — Z86718 Personal history of other venous thrombosis and embolism: Secondary | ICD-10-CM

## 2022-08-01 DIAGNOSIS — M069 Rheumatoid arthritis, unspecified: Secondary | ICD-10-CM

## 2022-08-01 DIAGNOSIS — J432 Centrilobular emphysema: Secondary | ICD-10-CM

## 2022-08-01 DIAGNOSIS — I10 Essential (primary) hypertension: Secondary | ICD-10-CM | POA: Diagnosis not present

## 2022-08-01 DIAGNOSIS — E039 Hypothyroidism, unspecified: Secondary | ICD-10-CM

## 2022-08-01 DIAGNOSIS — R609 Edema, unspecified: Secondary | ICD-10-CM | POA: Diagnosis not present

## 2022-08-01 DIAGNOSIS — D631 Anemia in chronic kidney disease: Secondary | ICD-10-CM

## 2022-08-01 LAB — BASIC METABOLIC PANEL
BUN: 57 — AB (ref 4–21)
CO2: 25 — AB (ref 13–22)
Chloride: 105 (ref 99–108)
Creatinine: 3.3 — AB (ref 0.5–1.1)
Glucose: 97
Potassium: 4.2 mEq/L (ref 3.5–5.1)
Sodium: 141 (ref 137–147)

## 2022-08-01 LAB — COMPREHENSIVE METABOLIC PANEL
Albumin: 3.7 (ref 3.5–5.0)
Calcium: 10 (ref 8.7–10.7)
Globulin: 2.9
eGFR: 13

## 2022-08-01 LAB — HEPATIC FUNCTION PANEL
ALT: 10 U/L (ref 7–35)
AST: 14 (ref 13–35)
Alkaline Phosphatase: 78 (ref 25–125)
Bilirubin, Total: 0.5

## 2022-08-01 LAB — CBC: RBC: 2.71 — AB (ref 3.87–5.11)

## 2022-08-01 LAB — CBC AND DIFFERENTIAL
HCT: 25 — AB (ref 36–46)
Hemoglobin: 8.2 — AB (ref 12.0–16.0)
Neutrophils Absolute: 4750
Platelets: 420 10*3/uL — AB (ref 150–400)
WBC: 8.7

## 2022-08-01 LAB — TSH: TSH: 0.65 (ref 0.41–5.90)

## 2022-08-01 NOTE — Assessment & Plan Note (Signed)
Bun/creat 62/3.71 07/25/22, EPO,  followed by Nephrology 

## 2022-08-01 NOTE — Assessment & Plan Note (Signed)
prn Albuterol ACT, Triamcinolone nasal spray, O2 2lpm

## 2022-08-01 NOTE — Assessment & Plan Note (Signed)
Blood pressure is controlled, takes Coreg, off Amlodipine by nephrology.  

## 2022-08-01 NOTE — Assessment & Plan Note (Signed)
GERD/ EGD3/9/24 10 gastric ulcers (negative H pylori/malignancy), placed on PPI bid x 6 wks, then daily. prn Zofran 

## 2022-08-01 NOTE — Assessment & Plan Note (Addendum)
take Vit B12, Fe, Hgb 8.1 07/25/22, EPO per Nephrology. Update CBC/diff

## 2022-08-01 NOTE — Assessment & Plan Note (Signed)
takes Levothyroxine, TSH 0.985 02/16/22, update TSH 

## 2022-08-01 NOTE — Assessment & Plan Note (Signed)
takes Golimumab,  Leflunomide, Gabapentin, Hx of fx of knees, hands, wrists, cervical disc disease/fracture 

## 2022-08-01 NOTE — Progress Notes (Signed)
Location:   SNF [place] Room Number: 66B Place of Service:  SNF (31) Provider: Lennie Odor Leondro Coryell NP  Plotnikov, [name], MD  Patient Care Team: Cassandria Anger, MD as PCP - General (Internal Medicine) Leonie Aleene Swanner, MD as PCP - Cardiology (Cardiology) Leonie Ieisha Gao, MD as Consulting Physician (Cardiology) Gavin Pound, MD as Consulting Physician (Rheumatology) Armbruster, Carlota Raspberry, MD as Consulting Physician (Gastroenterology) Tanda Rockers, MD as Consulting Physician (Pulmonary Disease) Grace Isaac, MD (Inactive) as Consulting Physician (Cardiothoracic Surgery) Aloha Gell, MD as Consulting Physician (Obstetrics and Gynecology) Plotnikov, [name], MD Luretha Rued, RN as [place] Management  Extended Emergency Contact Information Primary Emergency Contact: [name] Address: [address]           [place], [place] [address] Johnnette Litter of [place] Phone: [phone] Mobile Phone: [phone] Relation: Spouse Secondary Emergency Contact: [facility] Address: [place], [place] [address] Johnnette Litter of [place] Phone: [phone] Relation: Son  Code Status: DNR Goals of care: Advanced Directive information    06/15/2022    2:15 PM  Advanced Directives  Does Patient Have a Medical Advance Directive? Yes  Type of Paramedic of [place];Living will  Copy of [place] in Chart? No - copy requested     Chief Complaint  Patient presents with   Acute Visit    Persisted swelling in legs     HPI:  Pt is a 82 y.o. female seen today for an acute visit for persisted swelling in legs, completed Furosemide 40mg  qd x 3 days since 07/27/22, now on 20mg  qd, the patient her swelling is better in her hand, still on O2 via [place] to maintain Sat O2>90%, less cough than prior.     Hospitalized 07/02/22-07/25/22 for acute R pyelonephritis and  sepsis, blood culture and urine culture positive E Coli, fully treated. The hospital stay was complicated with AKI, blood loss anemia-EGD3/9/24 10 gastric ulcers (negative H pylori/malignancy), placed PPI bid x 6 wks, then daily.                 Hospitalized 03/06/22-03/09/22, fell, left hip posterior dislocation, reduced in ER 03/06/22, followed by Ortho, left knee immobilizer use, SNF [facility] for therapy             Hospitalized 02/16/22-02/22/22 rectal bleed, external hemorrhoids, gb 6.1, PRBC x2 units transfused, f/u GI              Hospitalized 02/06/22-02/11/22 for acute PE R middle lobe,  on Eliquis now, treat with shortest course possible, dc Eliquis if bleeds again             DVT 01/28/22 [place] age indeterminate deep vein thrombosis  involving the right peroneal veins, on Eliquis             01/23/22 s/p total replacement of L hip, f/u Dr Erlinda Hong.             DOE/COPD, prn Albuterol ACT, Triamcinolone nasal spray, O2 2lpm  BRBPR hemorrhoids, Hydrocortisone cream, avoid constipation                          HTN, takes Coreg, off Amlodipine by nephrology.              HLD takes Atorvastatin, LDL 74 09/07/20  Vit D deficiency, takes Vit D             Anemia, take Vit B12, Fe, Hgb 8.1 07/25/22, EPO per Nephrology.              Constipation, stable             GERD/ EGD3/9/24 10 gastric ulcers (negative H pylori/malignancy), placed on PPI bid x 6 wks, then daily. prn Zofran             Hx of L GI diverticular bleed, on PPI             RA, takes Golimumab,  Leflunomide, Gabapentin, Hx of fx of knees, hands, wrists, cervical disc disease/fracture             Hypothyroidism, takes Levothyroxine, TSH 0.985 02/16/22             CAD s/p CABG x1, LIMA LAD for ostial LAD, EF wnl. S/p aneurysm repair, on Coreg, Statin             CKD, Bun/creat 62/3.71 07/25/22, EPO,  followed by Nephrology             TIA/Stroke, on Atorvastatin, Eliquis.              PMR, Hx of              Edema, BLE, resumed since  admitted to SNF [facility] Furosemide, Kcl per nephrology.   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 Most likely scar. Mild subendocardial ischemia. EF 71% LOW RISK.    Cervical disc disease    fracture   Chronic kidney disease    COPD (chronic obstructive pulmonary disease)    Diverticulosis    DVT (deep venous thrombosis)    RLE; Sept '23   History of DVT (deep vein thrombosis) 03/06/2022   History of pulmonary embolus (PE)    Oct '23   Hyperlipidemia    Hypertension    Hypothyroidism (acquired)    hypo   Myocardial infarction 11/2014   TIA   Polymyalgia rheumatica    2011 Dr. Charlestine Night   Pulmonary emboli 02/06/2022   01/2022 PE/DVT post-op. Lower GI bleed     Treat edema  Start Eliquis at the reduced dose due to Creat>1.5 and age >14   Rheumatoid arthritis 2011   Dr.Truslow; fracture knees, hands and wrists -    S/P CABG x 1 1996   CABG--LIMA-LAD for ostial LAD (not felt to be PCI amenable). EF NORMAL then; LIMA now atretic   Shortness of breath dyspnea    with exertion   Stroke 11/2014   TIA    Urinary frequency    Past Surgical History:  Procedure Laterality Date   ABDOMINAL AORTIC  ANEURYSM REPAIR  [id number]   Complicated by mesenteric artery stenosis and splenic artery infarction with acquired Asplenia   APPENDECTOMY     BIOPSY   07/08/2022   Procedure: BIOPSY;  Surgeon: Yetta Flock, MD;  Location: [facility];  Service: Gastroenterology;;   Lillard Anes  07/2011   right foot   CARDIAC CATHETERIZATION  2005   (Most recent CATH) - ostial LAD lesion 20-30% (down from 90% initially). Atretic LIMA. Minimal disease the RCA and Circumflex system.   CARPAL TUNNEL RELEASE Left    CATARACT EXTRACTION Bilateral    CERVICAL SPINE SURGERY     plate 2008 Dr. Saintclair Halsted   COLONOSCOPY     CORONARY ARTERY BYPASS GRAFT  1996   INCLUDED AN INTERNAL MAMMARY ARTERY TO THE LAD. EF WAS NORMAL   ESOPHAGOGASTRODUODENOSCOPY (EGD) WITH PROPOFOL N/A 07/08/2022   Procedure: ESOPHAGOGASTRODUODENOSCOPY (EGD) WITH PROPOFOL;  Surgeon: Yetta Flock, MD;  Location: [facility];  Service: Gastroenterology;  Laterality: N/A;   INGUINAL HERNIA REPAIR Right    LAPAROSCOPIC APPENDECTOMY N/A 06/28/2016   Procedure: APPENDECTOMY LAPAROSCOPIC;  Surgeon: Leighton Ruff, MD;  Location: [facility];  Service: General;  Laterality: N/A;   NM MYOVIEW LTD  06/2010   Fixed anteroseptal, apical and inferoapical defect with moderate size. Most likely scar. Mild subendocardial ischemia. EF 71% LOW RISK.    SPLENECTOMY     TOTAL HIP ARTHROPLASTY Left 01/23/2022   Procedure: LEFT TOTAL HIP ARTHROPLASTY ANTERIOR APPROACH;  Surgeon: Leandrew Koyanagi, MD;  Location: [facility];  Service: Orthopedics;  Laterality: Left;  3-C   TRANSTHORACIC ECHOCARDIOGRAM  12/2014   [facility]: Normal LV size & function. EF 55-60%,    vagina polyp     VIDEO ASSISTED THORACOSCOPY (VATS)/WEDGE RESECTION Left 03/02/2015   Procedure: VIDEO ASSISTED THORACOSCOPY (VATS), MINI THORACOTOMY, LEFT UPPER LOBE WEDGE, TAKE DOWN OF INTERNAL MAMMARY LESIONS, PLACEMENT OF ON-Q PUMP;  Surgeon: Grace Isaac, MD;  Location: [facility];  Service: Thoracic;  Laterality: Left;   VIDEO BRONCHOSCOPY N/A 03/02/2015   Procedure: BRONCHOSCOPY;  Surgeon: Grace Isaac, MD;  Location: [facility];  Service:  Thoracic;  Laterality: N/A;   VIDEO BRONCHOSCOPY WITH ENDOBRONCHIAL NAVIGATION N/A 10/08/2017   Procedure: VIDEO BRONCHOSCOPY WITH ENDOBRONCHIAL NAVIGATION WITH BIOPSIES OF LEFT UPPER LOBE AND LEFT LOWER LOBE;  Surgeon: Grace Isaac, MD;  Location: [facility];  Service: Thoracic;  Laterality: N/A;    Allergies  Allergen Reactions   Nsaids Other (See Comments)    Stomach upset Told to avoid due to Eliquis   Aspirin Other (See Comments)    Stomach upset Told to avoid due to Eliquis   Codeine Nausea And Vomiting   Nitrostat [Nitroglycerin] Other (See Comments)    Bradycardia. Drop in heart rate     Allergies as of 08/01/2022       Reactions   Nsaids Other (See Comments)   Stomach upset Told to avoid due to Eliquis   Aspirin Other (See Comments)   Stomach upset Told to avoid due to Eliquis   Codeine Nausea And Vomiting   Nitrostat [nitroglycerin] Other (See Comments)   Bradycardia. Drop in heart rate         Medication List        Accurate as of August 01, 2022  3:15 PM. If you have any questions, ask your nurse or doctor.          acetaminophen 325 MG tablet Commonly known as: TYLENOL Take 2 tablets (650 mg total) by  mouth 2 (two) times daily as needed (generalized pain).   albuterol 108 (90 Base) MCG/ACT inhaler Commonly known as: VENTOLIN HFA Inhale 1-2 puffs into the lungs every 4 (four) hours as needed for wheezing or shortness of breath.   apixaban 2.5 MG Tabs tablet Commonly known as: ELIQUIS Take 1 tablet (2.5 mg total) by mouth 2 (two) times daily.   ascorbic acid 500 MG tablet Commonly known as: VITAMIN C Take 1 tablet (500 mg total) by mouth daily.   atorvastatin 80 MG tablet Commonly known as: LIPITOR Take 1 tablet (80 mg total) by mouth daily.   carvedilol 3.125 MG tablet Commonly known as: COREG Take 1 tablet (3.125 mg total) by mouth 2 (two) times daily with a meal.   CENTRUM ADULT PO Take 1 tablet by mouth daily. 1 Tablet Daily.    Cyanocobalamin 2500 MCG Tabs Take 2,500 mcg by mouth daily.   docusate sodium 100 MG capsule Commonly known as: Colace Take 1-2 capsules (100-200 mg total) by mouth daily as needed for mild constipation.   gabapentin 300 MG capsule Commonly known as: NEURONTIN Take 1 capsule (300 mg total) by mouth at bedtime.   Golimumab 50 MG/0.5ML [place] Take 50 mg by mouth every 2 (two) months. Hold for the next 2 weeks per Orthopedic surgery recommendations.   leflunomide 20 MG tablet Commonly known as: ARAVA Take 1 tablet (20 mg total) by mouth daily.   levothyroxine 88 MCG tablet Commonly known as: SYNTHROID Take 1 tablet (88 mcg total) by mouth daily before breakfast.   pantoprazole 40 MG tablet Commonly known as: PROTONIX Take 1 tablet (40 mg total) by mouth 2 (two) times daily. Take twice daily x 6 weeks, then once daily thereafter.   polyethylene glycol 17 g packet Commonly known as: MIRALAX / GLYCOLAX Take 17 g by mouth daily as needed. What changed: reasons to take this   triamcinolone 55 MCG/ACT Aero nasal inhaler Commonly known as: NASACORT Place 1 spray into the nose daily.   Vitamin D3 50 MCG (2000 UT) Caps Generic drug: Cholecalciferol Take 1 capsule (2,000 Units total) by mouth daily.        Review of Systems  Constitutional:  Positive for fatigue. Negative for appetite change and fever.  HENT:  Negative for congestion and trouble swallowing.   Eyes:  Negative for visual disturbance.  Respiratory:  Positive for cough and shortness of breath. Negative for wheezing.        DOE  Cardiovascular:  Positive for leg swelling.  Gastrointestinal:  Negative for abdominal pain, blood in stool, constipation, nausea and vomiting.  Genitourinary:  Negative for difficulty urinating, dysuria and urgency.  Musculoskeletal:  Positive for arthralgias, back pain and gait problem. Negative for joint swelling.  Skin:  Negative for color change.  Neurological:  Negative for tremors  and headaches.  Psychiatric/Behavioral:  Negative for behavioral problems and sleep disturbance. The patient is not nervous/anxious.     Immunization History  Administered Date(s) Administered   Fluad Quad(high Dose 65+) 02/03/2021, 01/16/2022   Influenza Whole 05/02/2001, 01/27/2008, 01/12/2010, 12/31/2010, 01/25/2012   Influenza, High Dose Seasonal PF 01/14/2014, 02/13/2015, 12/21/2016, 01/09/2018, 12/29/2018   Influenza,inj,Quad PF,6+ Mos 12/23/2015   Influenza-Unspecified 02/03/2013, 01/13/2014, 02/13/2015, 01/20/2020   Meningococcal polysaccharide vaccine (MPSV4) 03/21/2012   Moderna SARS-COV2 Booster Vaccination 03/15/2020   Moderna Sars-Covid-2 Vaccination 06/02/2019, 06/30/2019   PPD Test 11/18/2013   Pfizer Covid-19 Vaccine Bivalent Booster 34yrs & up 01/19/2021, 10/12/2021   Pneumococcal Conjugate-13 03/20/2013   Pneumococcal Polysaccharide-23  05/02/2000, 06/23/2010, 02/26/2018   Td 05/01/2006   Tdap 06/01/2016   Zoster Recombinat (Shingrix) 05/16/2017, 07/20/2017   Zoster, Live 01/17/2006   Pertinent  Health Maintenance Due  Topic Date Due   COLONOSCOPY (Pts 45-35yrs Insurance coverage will need to be confirmed)  03/30/2019   INFLUENZA VACCINE  11/30/2022   DEXA SCAN  Completed      03/10/2022   11:15 AM 03/30/2022   10:46 AM 05/23/2022   10:48 AM 05/23/2022   10:58 AM 06/13/2022   10:42 PM  Fall Risk  Falls in the past year? 1 0 0 1 1  Was there an injury with Fall? 0 0  0 0  Fall Risk Category Calculator 1 0  1 1  Fall Risk Category (Retired) Low Low     (RETIRED) Patient Fall Risk Level High fall risk Low fall risk     Patient at Risk for Falls Due to History of fall(s);Impaired balance/gait No Fall Risks No Fall Risks Impaired balance/gait;History of fall(s)   Fall risk Follow up Falls evaluation completed Falls evaluation completed Falls evaluation completed Falls evaluation completed    Functional Status Survey:    Vitals:   08/01/22 1500  BP: 130/78   Pulse: 85  Resp: 20  Temp: 98.1 F (36.7 C)  SpO2: 97%  Weight: 160 lb 1.6 oz (72.6 kg)   Body mass index is 28.36 kg/m. Physical Exam Constitutional:      Comments: Exhausted easily.   HENT:     Head: Normocephalic and atraumatic.     Nose: Nose normal.     Mouth/Throat:     Mouth: Mucous membranes are moist.  Eyes:     Extraocular Movements: Extraocular movements intact.     Conjunctiva/sclera: Conjunctivae normal.     Pupils: Pupils are equal, round, and reactive to light.  Cardiovascular:     Rate and Rhythm: Normal rate and regular rhythm.     Heart sounds: No murmur heard. Pulmonary:     Effort: Pulmonary effort is normal.     Breath sounds: No wheezing, rhonchi or rales.  Abdominal:     General: Bowel sounds are normal.     Palpations: Abdomen is soft.     Tenderness: There is no abdominal tenderness.  Genitourinary:    Comments: External hemorrhoids from previous examination.  Musculoskeletal:     Cervical back: Normal range of motion and neck supple.     Right lower leg: Edema present.     Left lower leg: Edema present.     Comments: Moderate edema BLE L>R  Skin:    General: Skin is warm and dry.     Comments: L hip surgical scar  Neurological:     General: No focal deficit present.     Mental Status: She is alert and oriented to person, place, and time. Mental status is at baseline.     Motor: No weakness.     Gait: Gait abnormal.  Psychiatric:        Mood and Affect: Mood normal.        Behavior: Behavior normal.        Thought Content: Thought content normal.     Labs reviewed: Recent Labs    07/22/22 0247 07/23/22 0645 07/24/22 1009 07/25/22 0533  NA 136 137 138 139  K 3.7 3.8 4.3 4.0  CL 105 103 103 104  CO2 21* 22 21* 21*  GLUCOSE 121* 107* 132* 91  BUN 63* 63* 65* 62*  CREATININE 3.89*  3.95* 3.84* 3.71*  CALCIUM 8.8* 9.1 9.3 9.6  MG 1.3* 1.8  --  1.9  PHOS 4.4 4.8* 4.9* 5.4*   Recent Labs    07/03/22 0033 07/04/22 0709  07/14/22 0747 07/19/22 0237 07/21/22 0539 07/24/22 1009 07/25/22 0533  AST 38 25 13*  --   --   --   --   ALT 20 17 5   --   --   --   --   ALKPHOS 95 93 88  --   --   --   --   BILITOT 1.0 0.7 0.9  --   --   --   --   PROT 5.4* 5.4* 6.3*  --   --   --   --   ALBUMIN 2.5* 2.2* 3.4*   < > 2.8* 3.0* 2.9*   < > = values in this interval not displayed.   Recent Labs    07/18/22 0337 07/19/22 0237 07/19/22 2134 07/20/22 0423 07/22/22 0247 07/23/22 0327 07/25/22 0533  WBC 11.6* 10.8*  --  11.1* 10.7* 8.3 8.1  NEUTROABS 6.5 6.0  --  6.7  --   --   --   HGB 7.1* 7.0*   < > 7.6* 7.7* 7.9* 8.1*  HCT 21.4* 20.5*   < > 22.4* 23.6* 24.1* 24.8*  MCV 93.0 92.8  --  92.2 94.4 93.1 94.7  PLT 327 285  --  272 243 220 321   < > = values in this interval not displayed.   Lab Results  Component Value Date   TSH 0.985 02/16/2022   No results found for: "HGBA1C" Lab Results  Component Value Date   CHOL 157 09/07/2020   HDL 59.30 09/07/2020   LDLCALC 74 09/07/2020   LDLDIRECT 162.1 10/28/2009   TRIG 121.0 09/07/2020   CHOLHDL 3 09/07/2020    Significant Diagnostic Results in last 30 days:  DG CHEST PORT 1 VIEW  Result Date: 07/15/2022 CLINICAL DATA:  Hypoxia EXAM: PORTABLE CHEST 1 VIEW COMPARISON:  07/11/2022 FINDINGS: Prior CABG. Heart is borderline in size. Mediastinal contours within normal limits. Bilateral lower lobe airspace opacities have increased since prior study. Small bilateral effusions. No acute bony abnormality. IMPRESSION: Worsening bibasilar atelectasis or infiltrates with small effusions. Electronically Signed   By: Rolm Baptise M.D.   On: 07/15/2022 20:13   DG CHEST PORT 1 VIEW  Result Date: 07/11/2022 CLINICAL DATA:  Hypoxia EXAM: PORTABLE CHEST 1 VIEW COMPARISON:  CXR 07/08/22 FINDINGS: Small bilateral pleural effusions. Status post median sternotomy. No pneumothorax. Bibasilar airspace opacities which could represent atelectasis or infection. Cardiac and mediastinal  contours are unchanged. No radiographically apparent displaced rib fractures. Visualized upper abdomen is unremarkable. IMPRESSION: 1. Small bilateral pleural effusions. 2. Redemonstrated bibasilar airspace opacities, which could represent atelectasis or infection. Electronically Signed   By: Marin Roberts M.D.   On: 07/11/2022 16:55   US RENAL  Result Date: 07/11/2022 CLINICAL DATA:  Acute kidney injury in 82 year old female. EXAM: RENAL / URINARY TRACT ULTRASOUND COMPLETE COMPARISON:  MRI from November 10, 2021 FINDINGS: Right Kidney: Renal measurements: 9.6 x 4.1 x 4.2 cm = volume: 88 mL. No signs of hydronephrosis. Increased cortical echogenicity with marked renal cortical/parenchymal scarring. Cysts of the RIGHT kidney largest 3.9 x 4.7 x 3.6 cm in the upper pole with several smaller cysts not substantially changed compared to previous imaging for which no additional dedicated follow-up imaging is recommend. Left Kidney: Renal measurements: 9.8 x 4.5 x 4.4 cm = volume: 103  mL. Cysts on the LEFT as well largest 2.5 x 1.8 x 2.1 cm for which no additional dedicated follow-up imaging is recommended. Increased cortical echogenicity and parenchymal scarring is very similar to the recent/prior MRI evaluation. There is no hydronephrosis. Bladder: Urinary bladder is decompressed not well evaluated. Other: None. IMPRESSION: 1. No signs of hydronephrosis. 2. Increased cortical echogenicity and parenchymal scarring in both kidneys as can be seen in the setting of chronic medical renal disease. Electronically Signed   By: Zetta Bills M.D.   On: 07/11/2022 09:05   DG CHEST PORT 1 VIEW  Result Date: 07/08/2022 CLINICAL DATA:  Shortness of breath. EXAM: PORTABLE CHEST 1 VIEW COMPARISON:  07/02/2022 FINDINGS: Median sternotomy and CABG. The heart size is normal. There are small bilateral pleural effusions. There is new bibasilar patchy opacity. IMPRESSION: 1. Small bilateral pleural effusions. 2. New bibasilar patchy  opacities and cardiomegaly. Findings are consistent with pulmonary edema or infectious cause. Electronically Signed   By: Nolon Nations M.D.   On: 07/08/2022 11:38   DG Chest Portable 1 View  Result Date: 07/03/2022 CLINICAL DATA:  Altered mental status EXAM: PORTABLE CHEST 1 VIEW COMPARISON:  03/06/2022 FINDINGS: Prior CABG. Heart is borderline in size. Bibasilar linear atelectasis or scarring. No effusions or edema. No acute bony abnormality. IMPRESSION: Bibasilar atelectasis or scarring. Electronically Signed   By: Rolm Baptise M.D.   On: 07/03/2022 00:16   CT HEAD WO CONTRAST (5MM)  Result Date: 07/02/2022 CLINICAL DATA:  Weakness for 2 days with left facial droop EXAM: CT HEAD WITHOUT CONTRAST TECHNIQUE: Contiguous axial images were obtained from the base of the skull through the vertex without intravenous contrast. RADIATION DOSE REDUCTION: This exam was performed according to the departmental dose-optimization program which includes automated exposure control, adjustment of the mA and/or kV according to patient size and/or use of iterative reconstruction technique. COMPARISON:  02/06/2022 FINDINGS: Brain: No evidence of acute infarction, hemorrhage, hydrocephalus, extra-axial collection or mass lesion/mass effect. Mild atrophic changes and chronic white matter ischemic changes are noted. Vascular: No hyperdense vessel or unexpected calcification. Skull: Normal. Negative for fracture or focal lesion. Sinuses/Orbits: No acute finding. Other: None. IMPRESSION: Chronic atrophic and ischemic changes.  No acute abnormality noted. Electronically Signed   By: Inez Catalina M.D.   On: 07/02/2022 23:45    Assessment/Plan: Edema persisted swelling in legs, completed Furosemide 40mg  qd x 3 days since 07/27/22, now on 20mg  qd, the patient her swelling is better in her hand, still on O2 via [place] to maintain Sat O2>90%, less cough than prior.   Increase Furosemide 40mg  qd, BMP one week.   History of DVT (deep  vein thrombosis)  DVT 01/28/22 [place] age indeterminate deep vein thrombosis  involving the right peroneal veins, on Eliquis  COPD (chronic obstructive pulmonary disease) (HCC) prn Albuterol ACT, Triamcinolone nasal spray, O2 2lpm  Essential hypertension Blood pressure is controlled, takes Coreg, off Amlodipine by nephrology.   Anemia take Vit B12, Fe, Hgb 8.1 07/25/22, EPO per Nephrology.   GERD (gastroesophageal reflux disease) GERD/ EGD3/9/24 10 gastric ulcers (negative H pylori/malignancy), placed on PPI bid x 6 wks, then daily. prn Zofran  Rheumatoid arthritis (HCC) takes Golimumab,  Leflunomide, Gabapentin, Hx of fx of knees, hands, wrists, cervical disc disease/fracture  Acquired hypothyroidism takes Levothyroxine, TSH 0.985 02/16/22, update TSH  Aortic atherosclerosis (HCC)  s/p CABG x1, LIMA LAD for ostial LAD, EF wnl. S/p aneurysm repair, on Coreg, Statin  CKD (chronic kidney disease) stage 4, GFR 15-29  ml/min (HCC) - baseline SCr 2.5. follows with Dr. Royce Macadamia with [place] 62/3.71 07/25/22, EPO,  followed by Nephrology    Family/ staff Communication: plan of care reviewed with the patient, the patient's husband, and charge nurse.   Labs/tests ordered:  BMP, CBC/diff, TSH one week.

## 2022-08-01 NOTE — Assessment & Plan Note (Signed)
s/p CABG x1, LIMA LAD for ostial LAD, EF wnl. S/p aneurysm repair, on Coreg, Statin 

## 2022-08-01 NOTE — Assessment & Plan Note (Signed)
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 

## 2022-08-01 NOTE — Assessment & Plan Note (Signed)
persisted swelling in legs, completed Furosemide 40mg  qd x 3 days since 07/27/22, now on 20mg  qd, the patient her swelling is better in her hand, still on O2 via [place] to maintain Sat O2>90%, less cough than prior.   Increase Furosemide 40mg  qd, BMP one week.

## 2022-08-02 ENCOUNTER — Emergency Department (HOSPITAL_COMMUNITY): Payer: Medicare PPO

## 2022-08-02 ENCOUNTER — Emergency Department (HOSPITAL_COMMUNITY)
Admission: EM | Admit: 2022-08-02 | Discharge: 2022-08-02 | Disposition: A | Discharge status: Home / Self Care | Payer: Medicare PPO | Attending: Emergency Medicine | Admitting: Emergency Medicine

## 2022-08-02 DIAGNOSIS — W01198A Fall on same level from slipping, tripping and stumbling with subsequent striking against other object, initial encounter: Secondary | ICD-10-CM | POA: Diagnosis not present

## 2022-08-02 DIAGNOSIS — W19XXXA Unspecified fall, initial encounter: Secondary | ICD-10-CM

## 2022-08-02 DIAGNOSIS — S022XXA Fracture of nasal bones, initial encounter for closed fracture: Secondary | ICD-10-CM

## 2022-08-02 DIAGNOSIS — Z043 Encounter for examination and observation following other accident: Secondary | ICD-10-CM | POA: Diagnosis not present

## 2022-08-02 DIAGNOSIS — Z79899 Other long term (current) drug therapy: Secondary | ICD-10-CM | POA: Diagnosis not present

## 2022-08-02 DIAGNOSIS — I10 Essential (primary) hypertension: Secondary | ICD-10-CM | POA: Diagnosis not present

## 2022-08-02 DIAGNOSIS — I160 Hypertensive urgency: Secondary | ICD-10-CM | POA: Insufficient documentation

## 2022-08-02 DIAGNOSIS — S0083XA Contusion of other part of head, initial encounter: Secondary | ICD-10-CM | POA: Insufficient documentation

## 2022-08-02 DIAGNOSIS — Z7901 Long term (current) use of anticoagulants: Secondary | ICD-10-CM | POA: Diagnosis not present

## 2022-08-02 DIAGNOSIS — M79641 Pain in right hand: Secondary | ICD-10-CM | POA: Diagnosis not present

## 2022-08-02 DIAGNOSIS — M25561 Pain in right knee: Secondary | ICD-10-CM | POA: Insufficient documentation

## 2022-08-02 DIAGNOSIS — S0992XA Unspecified injury of nose, initial encounter: Secondary | ICD-10-CM | POA: Diagnosis present

## 2022-08-02 DIAGNOSIS — R509 Fever, unspecified: Secondary | ICD-10-CM | POA: Diagnosis not present

## 2022-08-02 DIAGNOSIS — S0003XA Contusion of scalp, initial encounter: Secondary | ICD-10-CM | POA: Diagnosis not present

## 2022-08-02 DIAGNOSIS — M25461 Effusion, right knee: Secondary | ICD-10-CM | POA: Diagnosis not present

## 2022-08-02 LAB — SAMPLE TO BLOOD BANK

## 2022-08-02 LAB — I-STAT CHEM 8, ED
BUN: 56 mg/dL — ABNORMAL HIGH (ref 8–23)
Calcium, Ion: 1.18 mmol/L (ref 1.15–1.40)
Chloride: 107 mmol/L (ref 98–111)
Creatinine, Ser: 3.6 mg/dL — ABNORMAL HIGH (ref 0.44–1.00)
Glucose, Bld: 104 mg/dL — ABNORMAL HIGH (ref 70–99)
HCT: 27 % — ABNORMAL LOW (ref 36.0–46.0)
Hemoglobin: 9.2 g/dL — ABNORMAL LOW (ref 12.0–15.0)
Potassium: 3.8 mmol/L (ref 3.5–5.1)
Sodium: 141 mmol/L (ref 135–145)
TCO2: 24 mmol/L (ref 22–32)

## 2022-08-02 LAB — COMPREHENSIVE METABOLIC PANEL
ALT: 13 U/L (ref 0–44)
AST: 18 U/L (ref 15–41)
Albumin: 3.3 g/dL — ABNORMAL LOW (ref 3.5–5.0)
Alkaline Phosphatase: 71 U/L (ref 38–126)
Anion gap: 15 (ref 5–15)
BUN: 56 mg/dL — ABNORMAL HIGH (ref 8–23)
CO2: 23 mmol/L (ref 22–32)
Calcium: 10 mg/dL (ref 8.9–10.3)
Chloride: 104 mmol/L (ref 98–111)
Creatinine, Ser: 3.35 mg/dL — ABNORMAL HIGH (ref 0.44–1.00)
GFR, Estimated: 13 mL/min — ABNORMAL LOW (ref >60)
Glucose, Bld: 110 mg/dL — ABNORMAL HIGH (ref 70–99)
Potassium: 3.8 mmol/L (ref 3.5–5.1)
Sodium: 142 mmol/L (ref 135–145)
Total Bilirubin: 0.9 mg/dL (ref 0.3–1.2)
Total Protein: 6.8 g/dL (ref 6.5–8.1)

## 2022-08-02 LAB — PROTIME-INR
INR: 1.2 (ref 0.8–1.2)
Prothrombin Time: 15 seconds (ref 11.4–15.2)

## 2022-08-02 LAB — CBC
HCT: 25.9 % — ABNORMAL LOW (ref 36.0–46.0)
Hemoglobin: 8.3 g/dL — ABNORMAL LOW (ref 12.0–15.0)
MCH: 31.2 pg (ref 26.0–34.0)
MCHC: 32 g/dL (ref 30.0–36.0)
MCV: 97.4 fL (ref 80.0–100.0)
Platelets: 383 10*3/uL (ref 150–400)
RBC: 2.66 MIL/uL — ABNORMAL LOW (ref 3.87–5.11)
RDW: 18.1 % — ABNORMAL HIGH (ref 11.5–15.5)
WBC: 10 10*3/uL (ref 4.0–10.5)
nRBC: 0.2 % (ref 0.0–0.2)

## 2022-08-02 LAB — LACTIC ACID, PLASMA: Lactic Acid, Venous: 0.8 mmol/L (ref 0.5–1.9)

## 2022-08-02 LAB — ETHANOL: Alcohol, Ethyl (B): <10 mg/dL (ref <10)

## 2022-08-02 MED ORDER — LABETALOL HCL 5 MG/ML IV SOLN
20.0000 mg | Freq: Once | INTRAVENOUS | Status: AC
  Administered 2022-08-02: 20 mg via INTRAVENOUS
  Filled 2022-08-02: qty 4

## 2022-08-02 MED ORDER — CARVEDILOL 3.125 MG PO TABS
3.1250 mg | ORAL_TABLET | Freq: Once | ORAL | Status: AC
  Administered 2022-08-02: 3.125 mg via ORAL
  Filled 2022-08-02: qty 1

## 2022-08-02 MED ORDER — TRANEXAMIC ACID FOR EPISTAXIS
500.0000 mg | Freq: Once | TOPICAL | Status: DC
  Filled 2022-08-02: qty 10

## 2022-08-02 MED ORDER — SALINE SPRAY 0.65 % NA SOLN
1.0000 | Freq: Once | NASAL | Status: DC
  Filled 2022-08-02: qty 44

## 2022-08-02 MED ORDER — LABETALOL HCL 5 MG/ML IV SOLN
10.0000 mg | Freq: Once | INTRAVENOUS | Status: AC
  Administered 2022-08-02: 10 mg via INTRAVENOUS
  Filled 2022-08-02: qty 4

## 2022-08-02 MED ORDER — OXYMETAZOLINE HCL 0.05 % NA SOLN
1.0000 | Freq: Once | NASAL | Status: DC
  Filled 2022-08-02: qty 30

## 2022-08-02 NOTE — ED Provider Notes (Signed)
[facility] Provider Note   CSN: [id number] Arrival date & time: 08/02/22  [id number]     History  Chief Complaint  Patient presents with   [name]    TARNI ALIAS is a 82 y.o. female.  HPI Presents as a level 2 trauma following a fall.  Patient is on Eliquis.  She notes that she slipped, fell, striking her face against the ground.  She has pain in her face, initially denies pain anywhere else, but on follow-up states that she has pain in her right hand, right knee.  She has no inability to move the hand, knee, wrist or any extremities, no distal loss of sensation either.    Home Medications Prior to Admission medications   Medication Sig Start Date End Date Taking? Authorizing Provider  acetaminophen (TYLENOL) 325 MG tablet Take 2 tablets (650 mg total) by mouth 2 (two) times daily as needed (generalized pain). 03/27/22   Mast, Man X, NP  albuterol (VENTOLIN HFA) 108 (90 Base) MCG/ACT inhaler Inhale 1-2 puffs into the lungs every 4 (four) hours as needed for wheezing or shortness of breath. 03/27/22   Mast, Man X, NP  apixaban (ELIQUIS) 2.5 MG TABS tablet Take 1 tablet (2.5 mg total) by mouth 2 (two) times daily. 04/28/22   Plotnikov, [name], MD  ascorbic acid (VITAMIN C) 500 MG tablet Take 1 tablet (500 mg total) by mouth daily. 03/27/22   Mast, Man X, NP  atorvastatin (LIPITOR) 80 MG tablet Take 1 tablet (80 mg total) by mouth daily. 03/27/22   Mast, Man X, NP  carvedilol (COREG) 3.125 MG tablet Take 1 tablet (3.125 mg total) by mouth 2 (two) times daily with a meal. 05/23/22   Plotnikov, [name], MD  Cholecalciferol (VITAMIN D3) 50 MCG (2000 UT) capsule Take 1 capsule (2,000 Units total) by mouth daily. 03/27/22   Mast, Man X, NP  Cyanocobalamin 2500 MCG TABS Take 2,500 mcg by mouth daily. 03/27/22   Mast, Man X, NP  docusate sodium (COLACE) 100 MG capsule Take 1-2 capsules (100-200 mg total) by mouth daily as needed for mild constipation.  03/27/22   Mast, Man X, NP  gabapentin (NEURONTIN) 300 MG capsule Take 1 capsule (300 mg total) by mouth at bedtime. 03/27/22   Mast, Man X, NP  Golimumab 50 MG/0.5ML SOAJ Take 50 mg by mouth every 2 (two) months. Hold for the next 2 weeks per Orthopedic surgery recommendations. 03/27/22   Mast, Man X, NP  leflunomide (ARAVA) 20 MG tablet Take 1 tablet (20 mg total) by mouth daily. 03/27/22   Mast, Man X, NP  levothyroxine (SYNTHROID) 88 MCG tablet Take 1 tablet (88 mcg total) by mouth daily before breakfast. 03/27/22   Mast, Man X, NP  Multiple Vitamins-Minerals (CENTRUM ADULT PO) Take 1 tablet by mouth daily. 1 Tablet Daily.    [provider]  pantoprazole (PROTONIX) 40 MG tablet Take 1 tablet (40 mg total) by mouth 2 (two) times daily. Take twice daily x 6 weeks, then once daily thereafter. 07/25/22   Ghimire, Henreitta Leber, MD  polyethylene glycol (MIRALAX / GLYCOLAX) 17 g packet Take 17 g by mouth daily as needed. Patient taking differently: Take 17 g by mouth daily as needed for mild constipation. 03/27/22   Mast, Man X, NP  triamcinolone (NASACORT) 55 MCG/ACT AERO nasal inhaler Place 1 spray into the nose daily. 03/27/22   Mast, Man X, NP      Allergies  Nsaids, Aspirin, Codeine, and Nitrostat [nitroglycerin]    Review of Systems   Review of Systems  Unable to perform ROS: Acuity of condition    Physical Exam Updated Vital Signs BP (!) 197/79   Pulse 83   Temp 98 F (36.7 C) (Oral)   Resp 18   Ht 5\' 3"  (1.6 m)   Wt 72.6 kg   SpO2 98%   BMI 28.36 kg/m  Physical Exam Vitals and nursing note reviewed.  Constitutional:      General: She is not in acute distress.    Appearance: She is well-developed.  HENT:     Head: Normocephalic.      Nose:   Eyes:     Conjunctiva/sclera: Conjunctivae normal.  Cardiovascular:     Rate and Rhythm: Normal rate and regular rhythm.  Pulmonary:     Effort: Pulmonary effort is normal. No respiratory distress.     Breath sounds:  Normal breath sounds. No stridor.  Abdominal:     General: There is no distension.     Tenderness: There is no abdominal tenderness. There is no guarding.  Musculoskeletal:     Comments: Unremarkable hand in terms of deformity, the patient describes pain in the palmar region, she does move it freely. Right knee with hematoma on patella, but she does flex the knee to command, similar to her contralateral side.  Skin:    General: Skin is warm and dry.  Neurological:     Mental Status: She is alert and oriented to person, place, and time.     Cranial Nerves: No cranial nerve deficit.     Comments: Age-appropriate atrophy otherwise neuroexam unremarkable.  Psychiatric:        Mood and Affect: Mood normal.     ED Results / Procedures / Treatments   Labs (all labs ordered are listed, but only abnormal results are displayed) Labs Reviewed  COMPREHENSIVE METABOLIC PANEL - Abnormal; Notable for the following components:      Result Value   Glucose, Bld 110 (*)    BUN 56 (*)    Creatinine, Ser 3.35 (*)    Albumin 3.3 (*)    GFR, Estimated 13 (*)    All other components within normal limits  CBC - Abnormal; Notable for the following components:   RBC 2.66 (*)    Hemoglobin 8.3 (*)    HCT 25.9 (*)    RDW 18.1 (*)    All other components within normal limits  I-STAT CHEM 8, ED - Abnormal; Notable for the following components:   BUN 56 (*)    Creatinine, Ser 3.60 (*)    Glucose, Bld 104 (*)    Hemoglobin 9.2 (*)    HCT 27.0 (*)    All other components within normal limits  ETHANOL  LACTIC ACID, PLASMA  PROTIME-INR  URINALYSIS, ROUTINE W REFLEX MICROSCOPIC  SAMPLE TO BLOOD BANK    EKG EKG Interpretation  Date/Time:  Wednesday August 02 2022 09:11:20 EDT Ventricular Rate:  96 PR Interval:  195 QRS Duration: 77 QT Interval:  319 QTC Calculation: 404 R Axis:   79 Text Interpretation: Sinus rhythm Artifact Otherwise within normal limits Confirmed by Carmin Muskrat [phone]) on  08/02/2022 9:40:00 AM  Radiology DG Knee AP/LAT W/Sunrise Right  Result Date: 08/02/2022 CLINICAL DATA:  Anterior knee pain after falling. On blood thinners. Bruising and swelling. EXAM: RIGHT KNEE 3 VIEWS COMPARISON:  Radiographs 02/15/2020 FINDINGS: The bones appear mildly demineralized. There is no evidence of acute fracture  or dislocation. Tricompartmental degenerative changes are again noted, most advanced in the lateral compartment. There is meniscal chondrocalcinosis. There is a probable small nonspecific knee joint effusion without evidence of lipohemarthrosis. The soft tissues appear edematous. No evidence of foreign body or soft tissue emphysema. IMPRESSION: No evidence of acute fracture or dislocation. Tricompartmental degenerative changes with probable small nonspecific knee joint effusion. Electronically Signed   By: Richardean Sale M.D.   On: 08/02/2022 11:17   DG Hand Complete Right  Result Date: 08/02/2022 CLINICAL DATA:  Fall.  Ring and small finger bruising. EXAM: RIGHT HAND - COMPLETE 3+ VIEW COMPARISON:  None Available. FINDINGS: There is no evidence of fracture or dislocation. There is no evidence of arthropathy or other focal bone abnormality. Soft tissues are unremarkable. Chondrocalcinosis of the TFCC. IMPRESSION: 1. No acute osseous abnormality. Electronically Signed   By: Titus Dubin M.D.   On: 08/02/2022 10:18   CT CERVICAL SPINE WO CONTRAST  Result Date: 08/02/2022 CLINICAL DATA:  82 year old female status post fall from standing. Struck head on concrete floor. On Eliquis. EXAM: CT CERVICAL SPINE WITHOUT CONTRAST TECHNIQUE: Multidetector CT imaging of the cervical spine was performed without intravenous contrast. Multiplanar CT image reconstructions were also generated. RADIATION DOSE REDUCTION: This exam was performed according to the departmental dose-optimization program which includes automated exposure control, adjustment of the mA and/or kV according to patient size  and/or use of iterative reconstruction technique. COMPARISON:  CT head and face today.  Cervical spine CT 02/15/2020. FINDINGS: Alignment: Stable compared to 2021. Cervicothoracic junction alignment is within normal limits. Bilateral posterior element alignment is within normal limits. Skull base and vertebrae: Visualized skull base is intact. No atlanto-occipital dissociation. C1 and C2 appear intact and aligned. Chronic postoperative details are below. No acute osseous abnormality identified. Soft tissues and spinal canal: No prevertebral fluid or swelling. No visible canal hematoma. Negative noncontrast visible neck soft tissues aside from calcified left carotid atherosclerosis. Disc levels: Chronic C3-C4 and C4-C5 ACDF with solid arthrodesis. Bulky chronic C5-C6 disc and endplate degeneration. Advanced chronic C6-C7 and C7-T1 disc degeneration. Mild if any associated cervical spinal stenosis. Upper chest: Grossly intact visible upper thoracic levels. Superimposed respiratory motion artifact. Partially visible apical lung scarring with some superimposed pulmonary septal thickening. Staple line in the left lung apex might be new since [id number], uncertain. Prior sternotomy. IMPRESSION: 1. No acute traumatic injury identified in the cervical spine. 2. Chronic C3-C4 and C4-C5 ACDF with solid arthrodesis. 3. Motion artifact in the upper chest. Electronically Signed   By: Genevie Ann M.D.   On: 08/02/2022 09:58   CT MAXILLOFACIAL WO CONTRAST  Result Date: 08/02/2022 CLINICAL DATA:  82 year old female status post fall from standing. Struck head on concrete floor. On Eliquis. EXAM: CT MAXILLOFACIAL WITHOUT CONTRAST TECHNIQUE: Multidetector CT imaging of the maxillofacial structures was performed. Multiplanar CT image reconstructions were also generated. RADIATION DOSE REDUCTION: This exam was performed according to the departmental dose-optimization program which includes automated exposure control, adjustment of the mA  and/or kV according to patient size and/or use of iterative reconstruction technique. COMPARISON:  Head and cervical spine CT today. Previous head CT 07/02/2022. FINDINGS: Osseous: Mandible intact and normally located. No acute dental finding. Mildly comminuted minimally displaced bilateral superior nasal bone fractures (series 8, image 43) with regional soft tissue swelling. Mild associated buckling of the bony nasal septum on series 5, image 58 is new from last month. But bilateral maxilla, zygoma, and pterygoid bones appear intact. Central skull base appears intact.  Cervical spine is detailed separately. Orbits: Intact orbital walls. Bilateral orbit medial canthus, preseptal soft tissue swelling and stranding in conjunction with nasal bridge and forehead soft tissue injury. Postoperative changes to both globes. Bilateral intraorbital soft tissues remain normal. Sinuses: Retained secretions in the nasal cavity associated with anterior nasal septal injury. No discrete nasal septal hematoma is evident. Paranasal sinuses, tympanic cavities and mastoids remain well aerated. Soft tissues: Retained secretions in the nasopharynx. Negative visible other noncontrast deep soft tissue spaces of the face except for bulky left cervical carotid calcified atherosclerosis. Limited intracranial: Stable to that reported separately. IMPRESSION: 1. Mildly comminuted and minimally displaced fractures of the superior nasal bones, as well as the bony nasal septum. Regional soft tissue swelling. Retained secretions in the nasal cavity and nasopharynx. 2. No other facial fracture identified. Electronically Signed   By: Genevie Ann M.D.   On: 08/02/2022 09:54   CT HEAD WO CONTRAST  Result Date: 08/02/2022 CLINICAL DATA:  82 year old female status post fall from standing. Struck head on concrete floor. On Eliquis. EXAM: CT HEAD WITHOUT CONTRAST TECHNIQUE: Contiguous axial images were obtained from the base of the skull through the vertex  without intravenous contrast. RADIATION DOSE REDUCTION: This exam was performed according to the departmental dose-optimization program which includes automated exposure control, adjustment of the mA and/or kV according to patient size and/or use of iterative reconstruction technique. COMPARISON:  Face and cervical spine CT today. Prior head CT 07/02/2022. FINDINGS: Brain: Cerebral volume is stable and within normal limits for age. No midline shift, ventriculomegaly, mass effect, evidence of mass lesion, intracranial hemorrhage or evidence of cortically based acute infarction. Gray-white differentiation is stable and within normal limits for age. Vascular: Calcified atherosclerosis at the skull base. No suspicious intracranial vascular hyperdensity. Skull: No skull fracture identified. Facial bones are detailed separately. Sinuses/Orbits: Visualized paranasal sinuses and mastoids are stable and well aerated. Other: Broad-based up to 13 mm thick anterior forehead scalp hematoma. Underlying frontal bones and frontal sinuses appear intact. See also face CT reported separately. IMPRESSION: 1. Broad-based forehead scalp hematoma. No skull fracture identified, Face CT reported separately. 2. Stable and normal for age non contrast CT appearance of the brain. Electronically Signed   By: Genevie Ann M.D.   On: 08/02/2022 09:49    Procedures Procedures    Medications Ordered in ED Medications  oxymetazoline (AFRIN) 0.05 % nasal spray 1 spray (has no administration in time range)  tranexamic acid (CYKLOKAPRON) 1000 MG/10ML topical solution 500 mg (has no administration in time range)  sodium chloride (OCEAN) 0.65 % nasal spray 1 spray (has no administration in time range)  labetalol (NORMODYNE) injection 10 mg (10 mg Intravenous Given 08/02/22 1022)  carvedilol (COREG) tablet 3.125 mg (3.125 mg Oral Given 08/02/22 1021)  labetalol (NORMODYNE) injection 20 mg (20 mg Intravenous Given 08/02/22 1443)    ED Course/ Medical  Decision Making/ A&P                             Medical Decision Making Adult female with multiple medical issues including hypertension, aneurysm repair, now on Eliquis presents after mechanical fall with notable facial swelling.  Patient is awake, alert, neurologically unremarkable, given her anticoagulation use differential including intracranial hemorrhage versus fracture considered.  Additional considerations of possible fall include hypertensive crisis. Labs monitoring CT all started from initial evaluation. On monitor the patient is sinus rhythm, 75 sinus normal Pulse ox 96% room air normal  Notably, however, patient is hypertensive  Amount and/or Complexity of Data Reviewed Independent Historian: spouse and EMS External Data Reviewed: notes. Labs: ordered. Decision-making details documented in ED Course. Radiology: ordered and independent interpretation performed. Decision-making details documented in ED Course. ECG/medicine tests: ordered and independent interpretation performed. Decision-making details documented in ED Course.  Risk OTC drugs. Prescription drug management.  To: Patient continues to be hypertensive, greater than [id number] systolic, she has received Coreg and labetalol.  Update:, Patient's blood pressure transiently improved, but is now greater than 200.  Additional labetalol provided 3:14 PM Bleed has stopped, patient is tolerating her home nasal cannula without complication, blood pressure now 0000000 systolic.  In essence this adult female presents after mechanical fall.  She has multiple medical problems including prior mesenteric ischemia, on Eliquis.  Patient's evaluation most notable for demonstration of multiple nasal bone fractures, with ongoing difficult to control blood pressure.  I discussed her case with our ENT specialist, Dr. Janace Hoard.  With his assistance therapy was successful, the patient was able to return to her home nasal cannula baseline.  Patient did  require multiple doses of IV beta-blocker, as well as home medications with eventual blood pressure control.  She has known hypertension as well as poor renal function, though this does not seem markedly changed. After hours of monitoring without decompensation with improvement here without any new bleeding, or evidence for neuro changes, the patient was discharged to follow-up closely as an outpatient with primary care and ENT.        Final Clinical Impression(s) / ED Diagnoses Final diagnoses:  Fall, initial encounter  Closed fracture of nasal bone, initial encounter  Hypertensive urgency   CRITICAL CARE Performed by: Carmin Muskrat Total critical care time: 35 minutes Critical care time was exclusive of separately billable procedures and treating other patients. Critical care was necessary to treat or prevent imminent or life-threatening deterioration. Critical care was time spent personally by me on the following activities: development of treatment plan with patient and/or surrogate as well as nursing, discussions with consultants, evaluation of patient's response to treatment, examination of patient, obtaining history from patient or surrogate, ordering and performing treatments and interventions, ordering and review of laboratory studies, ordering and review of radiographic studies, pulse oximetry and re-evaluation of patient's condition.   Rx / DC Orders ED Discharge Orders     None         Carmin Muskrat, MD 08/02/22 1517

## 2022-08-02 NOTE — Discharge Instructions (Signed)
As discussed, with your injuries, and your blood pressure issues it is important that you follow-up with both our ENT colleague, and your primary care physician for appropriate ongoing outpatient follow-up.  Do not hesitate to return here for any concerning changes in your condition.

## 2022-08-02 NOTE — Progress Notes (Signed)
..Trauma Response Nurse Documentation   [name] is a 82 y.o. female arriving to [facility] ED via EMS  On Eliquis (apixaban) daily. Trauma was activated as a Level 2 by ED Charge RN based on the following trauma criteria Elderly patients > 65 with head trauma on anti-coagulation (excluding ASA). Trauma team at the bedside on patient arrival.   Patient cleared for CT by Dr. Vanita Panda. Pt transported to CT with trauma response nurse present to monitor. RN remained with the patient throughout their absence from the department for clinical observation.   GCS 15.  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 Most likely scar. Mild subendocardial ischemia. EF 71% LOW RISK.    Cervical disc disease    fracture   Chronic kidney disease    COPD (chronic obstructive pulmonary disease)    Diverticulosis    DVT (deep venous thrombosis)    RLE; Sept '23   History of DVT (deep vein thrombosis) 03/06/2022   History of pulmonary embolus (PE)    Oct '23   Hyperlipidemia    Hypertension    Hypothyroidism (acquired)    hypo   Myocardial infarction 11/2014   TIA   Polymyalgia rheumatica    2011 Dr. Charlestine Night   Pulmonary emboli 02/06/2022   01/2022 PE/DVT post-op. Lower GI bleed     Treat edema  Start Eliquis at the reduced dose due to  Creat>1.5 and age >57   Rheumatoid arthritis 2011   Dr.Truslow; fracture knees, hands and wrists -    S/P CABG x 1 1996   CABG--LIMA-LAD for ostial LAD (not felt to be PCI amenable). EF NORMAL then; LIMA now atretic   Shortness of breath dyspnea    with exertion   Stroke 11/2014   TIA    Urinary frequency      Past Surgical History:  Procedure Laterality Date   ABDOMINAL AORTIC ANEURYSM REPAIR  [id number]   Complicated by mesenteric artery stenosis and splenic artery infarction with acquired Asplenia   APPENDECTOMY     BIOPSY  07/08/2022   Procedure: BIOPSY;  Surgeon: Yetta Flock, MD;  Location: [facility];  Service: Gastroenterology;;   Lillard Anes  07/2011   right foot   CARDIAC CATHETERIZATION  2005   (Most recent CATH) - ostial LAD lesion 20-30% (down from 90% initially). Atretic LIMA. Minimal disease the RCA and Circumflex system.   CARPAL TUNNEL RELEASE Left    CATARACT EXTRACTION Bilateral    CERVICAL SPINE SURGERY     plate 2008 Dr. Saintclair Halsted   COLONOSCOPY     CORONARY ARTERY BYPASS GRAFT  1996   INCLUDED AN INTERNAL MAMMARY ARTERY TO THE LAD. EF WAS NORMAL   ESOPHAGOGASTRODUODENOSCOPY (EGD) WITH PROPOFOL N/A 07/08/2022  Procedure: ESOPHAGOGASTRODUODENOSCOPY (EGD) WITH PROPOFOL;  Surgeon: Yetta Flock, MD;  Location: [place];  Service: Gastroenterology;  Laterality: N/A;   INGUINAL HERNIA REPAIR Right    LAPAROSCOPIC APPENDECTOMY N/A 06/28/2016   Procedure: APPENDECTOMY LAPAROSCOPIC;  Surgeon: Leighton Ruff, MD;  Location: [facility] ORS;  Service: General;  Laterality: N/A;   NM MYOVIEW LTD  06/2010   Fixed anteroseptal, apical and inferoapical defect with moderate size. Most likely scar. Mild subendocardial ischemia. EF 71% LOW RISK.    SPLENECTOMY     TOTAL HIP ARTHROPLASTY Left 01/23/2022   Procedure: LEFT TOTAL HIP ARTHROPLASTY ANTERIOR APPROACH;  Surgeon: Leandrew Koyanagi, MD;  Location: [facility];  Service: Orthopedics;  Laterality: Left;  3-C   TRANSTHORACIC  ECHOCARDIOGRAM  12/2014   [facility]: Normal LV size & function. EF 55-60%,    vagina polyp     VIDEO ASSISTED THORACOSCOPY (VATS)/WEDGE RESECTION Left 03/02/2015   Procedure: VIDEO ASSISTED THORACOSCOPY (VATS), MINI THORACOTOMY, LEFT UPPER LOBE WEDGE, TAKE DOWN OF INTERNAL MAMMARY LESIONS, PLACEMENT OF ON-Q PUMP;  Surgeon: Grace Isaac, MD;  Location: [place];  Service: Thoracic;  Laterality: Left;   VIDEO BRONCHOSCOPY N/A 03/02/2015   Procedure: BRONCHOSCOPY;  Surgeon: Grace Isaac, MD;  Location: [place];  Service: Thoracic;  Laterality: N/A;   VIDEO BRONCHOSCOPY WITH ENDOBRONCHIAL NAVIGATION N/A 10/08/2017   Procedure: VIDEO BRONCHOSCOPY WITH ENDOBRONCHIAL NAVIGATION WITH BIOPSIES OF LEFT UPPER LOBE AND LEFT LOWER LOBE;  Surgeon: Grace Isaac, MD;  Location: [facility];  Service: Thoracic;  Laterality: N/A;       Initial Focused Assessment (If applicable, or please see trauma documentation): Pt A&O. Hematoma noted to forehead. Bleeding from B nares is controlled.   CT's Completed:   CT Head, CT Maxillofacial, and CT C-Spine   Interventions:  12 lead EKG US guided PIV  labs CT  R hand Xray   Plan for disposition:  pending   Event Summary: Pt BIB EMS post fall. On Eliquis. 2-3L O2 at home. Pt with SBP >200 en route. Pt arrived in NAD. A&O x4. Denies LOC. Hematoma noted to mid forehead, bleeding controlled from B nares. Ecchymosis B eyes. Manual BP 186/86 on arrival. 2L O2 placed on pt d/t home use. #20g USGPIV placed in R bicep. Labs sent. 12 Lead EKG. Taken to CT. Pt tolerated well. Returned to ED 12. Call light in reach, bed alarm on. SBP 193, Shannon RN aware.    Bedside handoff with ED RN Larene Beach.    Renard Hamper  Trauma Response RN  Please call TRN at [phone] for further assistance.

## 2022-08-02 NOTE — ED Notes (Signed)
Patient transported to CT 

## 2022-08-02 NOTE — ED Triage Notes (Signed)
Pt bib ems from [facility] c/o mechanical fall. Pt was bending over and fell forward. Pt hit her head on the concrete floor. Pt has a hematoma mid forehead. Pt is on Eliquis. Pt denies LOC. Pt Aox4  BP 280/70 HR 90 Nasal canula 2-3L

## 2022-08-02 NOTE — Progress Notes (Signed)
Transition of Care [facility]) - Emergency Department Mini Assessment   Patient Details  Name: [name] MRN: [id number] Date of Birth: [birth year]  Transition of Care [facility]) CM/SW Contact:    Fuller Mandril, RN Phone Number: 08/02/2022, 3:40 PM   Clinical Narrative: RNCM contacted by [facility] regarding pt returning.  RNCM confirmed with EDRN that [facility] has been called for transport.  RNCM informed EDRN that report needs to be called to Hassan Rowan [phone]   ED Mini Assessment:    Barriers to Discharge: (P) Barriers Resolved  Barrier interventions: (P) confirmed that pt can return to SNF  Means of departure: (P) Ambulance  Interventions which prevented an admission or readmission: (P) Transportation Screening    Patient Contact and Communications        ,          Patient states their goals for this hospitalization and ongoing recovery are:: (P) [facility]      Admission diagnosis:  Level II, FOT Patient Active Problem List   Diagnosis Date Noted   Edema 07/26/2022   Acute gastric ulcer with hemorrhage 07/08/2022   Duodenal ulcer 07/08/2022   Dark stools 07/07/2022   Anemia 07/07/2022   E coli bacteremia 07/04/2022   Acute pyelonephritis [id number]   Acute metabolic encephalopathy [id number]   Hyponatremia 07/03/2022   Sepsis with acute renal failure 07/03/2022   DNR (do not resuscitate)/DNI(Do Not Intubate) 07/03/2022   Generalized weakness 05/23/2022   Gallstones 03/17/2022   Acute renal failure superimposed on stage 4 chronic kidney disease 0000000   Metabolic acidosis, increased anion gap 03/06/2022   Hip dislocation, left 03/06/2022   History of anemia due to chronic kidney disease 03/06/2022   History of DVT (deep vein thrombosis) 03/06/2022   Allergic rhinitis 03/06/2022   History of pulmonary embolism 02/06/2022   Vitamin D deficiency 01/30/2022   Slow transit constipation 01/30/2022   GERD (gastroesophageal reflux disease) 01/30/2022    Status post total replacement of left hip 01/23/2022   Melena    Upper GI bleed 11/17/2021   CKD (chronic kidney disease) stage 4, GFR 15-29 ml/min (HCC) - baseline SCr 2.5. follows with Dr. Royce Macadamia with [facility] 11/17/2021   Primary osteoarthritis of left hip 10/06/2021   Left hip pain 12/09/2020   Aortic atherosclerosis 09/07/2020   Gait disorder 06/08/2020   Neoplasm of uncertain behavior of skin 03/14/2020   Arthralgia 12/25/2019   Ankle swelling 08/08/2019   Osteoporosis 02/26/2018   Colon polyp 06/28/2016   COPD (chronic obstructive pulmonary disease) 05/28/2016   Upper airway cough syndrome 05/22/2016   Wrist pain, right 12/14/2015   Lung mass 03/02/2015   Pre-operative cardiovascular examination 02/21/2015   DOE (dyspnea on exertion) 02/16/2015   TIA (transient ischemic attack) 12/31/2014   Abnormal mental state 12/30/2014   Well adult exam 11/26/2014   Seasonal allergies 12/31/2013   Splenic artery aneurysm 04/07/2013   Cerumen impaction 03/20/2013   Hyperlipidemia    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   Anticoagulated 11/08/2010   Rheumatoid arthritis 06/23/2010   Pain in joint 11/26/2009   Acquired hypothyroidism 12/08/2006   Macular degeneration (senile) of retina 12/08/2006   Essential hypertension 12/08/2006   Diaphragmatic hernia 12/08/2006   ASPLENIA 12/08/2006   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 [place] [address] Phone: [phone] Fax: [phone]

## 2022-08-07 ENCOUNTER — Ambulatory Visit: Payer: Medicare PPO | Admitting: Internal Medicine

## 2022-08-07 ENCOUNTER — Encounter: Payer: Self-pay | Admitting: Nurse Practitioner

## 2022-08-07 ENCOUNTER — Telehealth: Payer: Self-pay

## 2022-08-07 ENCOUNTER — Non-Acute Institutional Stay (SKILLED_NURSING_FACILITY): Payer: Medicare PPO | Admitting: Nurse Practitioner

## 2022-08-07 DIAGNOSIS — K219 Gastro-esophageal reflux disease without esophagitis: Secondary | ICD-10-CM

## 2022-08-07 DIAGNOSIS — M069 Rheumatoid arthritis, unspecified: Secondary | ICD-10-CM | POA: Diagnosis not present

## 2022-08-07 DIAGNOSIS — S022XXD Fracture of nasal bones, subsequent encounter for fracture with routine healing: Secondary | ICD-10-CM | POA: Diagnosis not present

## 2022-08-07 DIAGNOSIS — K5901 Slow transit constipation: Secondary | ICD-10-CM

## 2022-08-07 DIAGNOSIS — I10 Essential (primary) hypertension: Secondary | ICD-10-CM | POA: Diagnosis not present

## 2022-08-07 DIAGNOSIS — N184 Chronic kidney disease, stage 4 (severe): Secondary | ICD-10-CM | POA: Diagnosis not present

## 2022-08-07 DIAGNOSIS — S022XXA Fracture of nasal bones, initial encounter for closed fracture: Secondary | ICD-10-CM | POA: Insufficient documentation

## 2022-08-07 DIAGNOSIS — J432 Centrilobular emphysema: Secondary | ICD-10-CM | POA: Diagnosis not present

## 2022-08-07 DIAGNOSIS — E039 Hypothyroidism, unspecified: Secondary | ICD-10-CM

## 2022-08-07 DIAGNOSIS — Z86711 Personal history of pulmonary embolism: Secondary | ICD-10-CM | POA: Diagnosis not present

## 2022-08-07 DIAGNOSIS — D631 Anemia in chronic kidney disease: Secondary | ICD-10-CM

## 2022-08-07 DIAGNOSIS — Z86718 Personal history of other venous thrombosis and embolism: Secondary | ICD-10-CM

## 2022-08-07 DIAGNOSIS — R609 Edema, unspecified: Secondary | ICD-10-CM

## 2022-08-07 DIAGNOSIS — I700 Atherosclerosis of aorta: Secondary | ICD-10-CM

## 2022-08-07 NOTE — Assessment & Plan Note (Signed)
02/06/22 acute PE R middle lobe,  on Eliquis now, treat with shortest course possible, dc Eliquis if bleeds again

## 2022-08-07 NOTE — Assessment & Plan Note (Signed)
takes Golimumab,  Leflunomide, Gabapentin, Hx of fx of knees, hands, wrists, cervical disc disease/fracture 

## 2022-08-07 NOTE — Progress Notes (Signed)
Location:   SNF [facility] Room Number: 66B Place of Service:  SNF (31) Provider: Arna Snipe Kamsiyochukwu Buist NP  Plotnikov, Georgina Quint, MD  Patient Care Team: [name], MD as PCP - General (Internal Medicine) [name], MD as PCP - Cardiology (Cardiology) [name], MD as Consulting Physician (Cardiology) [name], MD as Consulting Physician (Rheumatology) Armbruster, [name], MD as Consulting Physician (Gastroenterology) [name], MD as Consulting Physician (Pulmonary Disease) [name], MD (Inactive) as Consulting Physician (Cardiothoracic Surgery) [name], MD as Consulting Physician (Obstetrics and Gynecology) Plotnikov, Georgina Quint, MD Colletta Maryland, RN as [facility] Care Management  Extended Emergency Contact Information Primary Emergency Contact: [name] Address: [address]           [place], [place] [address] Darden Amber of [place] Home Phone: [phone] Mobile Phone: [phone] Relation: Spouse Secondary Emergency Contact: [facility] Address: [address]          [place], [place] [address] Darden [place] of [place] Mobile Phone: [phone] Relation: Son  Code Status:  DNR Goals of care: Advanced Directive information    08/07/2022   11:36 AM  Advanced Directives  Does Patient Have a Medical Advance Directive? No  Does patient want to make changes to medical advance directive? No - Patient declined     Chief Complaint  Patient presents with   Medical Management of Chronic Issues    Routine Visit   Quality Metric Gaps    Needs to discuss colonoscopy and Covid vaccine.    HPI:  Pt is a 82 y.o. female seen today for medical management of chronic diseases.    ED 08/02/22 fall, facial bruises, sustained Mildly comminuted and minimally displaced fractures of the superior nasal bones, as well as the bony nasal septum, improved right hand and right knee pain, CT head/cervical  spine, X-ray R hand, R knee showed no acute process     Hospitalized 07/02/22-07/25/22 for acute R pyelonephritis and sepsis, blood culture and urine culture positive E Coli, fully treated. The hospital stay was complicated with AKI, blood loss anemia-EGD3/9/24 10 gastric ulcers (negative H pylori/malignancy), placed PPI bid x 6 wks, then daily.                 Hospitalized 03/06/22-03/09/22, fell, left hip posterior dislocation, reduced in ER 03/06/22, followed by Ortho, left knee immobilizer use, SNF [facility] for therapy              Hospitalized 02/16/22-02/22/22 rectal bleed, external hemorrhoids, gb 6.1, PRBC x2 units transfused, f/u GI               Hospitalized 02/06/22-02/11/22 for acute PE R middle lobe,  on Eliquis now, treat with shortest course possible, dc Eliquis if bleeds again              DVT 01/28/22 [place] age indeterminate deep vein thrombosis  involving the right peroneal veins, on Eliquis             01/23/22 s/p total replacement of L hip, f/u Dr Roda Shutters.             DOE/COPD, prn Albuterol ACT, Triamcinolone nasal spray, O2 2lpm  BRBPR hemorrhoids, Hydrocortisone cream, avoid constipation                          HTN, takes Coreg, off Amlodipine by nephrology.  HLD takes Atorvastatin, LDL 74 09/07/20             Vit D deficiency, takes Vit D             Anemia, take Vit B12, Fe, Hgb 8.1 07/25/22<<9.2 08/02/22, EPO per Nephrology.              Constipation, dietary fiber             GERD/ EGD3/9/24 10 gastric ulcers (negative H pylori/malignancy), placed on PPI bid x 6 wks, then daily. prn Zofran             Hx of L GI diverticular bleed, on PPI             RA, takes Golimumab,  Leflunomide, Gabapentin, Hx of fx of knees, hands, wrists, cervical disc disease/fracture             Hypothyroidism, takes Levothyroxine, TSH 0.65 08/01/22             CAD s/p CABG x1, LIMA LAD for ostial LAD, EF wnl. S/p aneurysm repair, on Coreg, Statin             CKD, Bun/creat 56/3.6 08/01/32, EPO,   followed by Nephrology             TIA/Stroke, on Atorvastatin, Eliquis.              PMR, Hx of              Edema, BLE, resumed since admitted to SNF [facility] Furosemide, off Kcl per nephrology in the past.    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 Most likely scar. Mild subendocardial ischemia. EF 71% LOW RISK.    Cervical disc disease    fracture   Chronic kidney disease    COPD (chronic obstructive pulmonary disease)    Diverticulosis    DVT (deep venous thrombosis)    RLE; Sept '23   History of DVT (deep vein thrombosis) 03/06/2022   History of pulmonary embolus (PE)    Oct '23   Hyperlipidemia    Hypertension    Hypothyroidism (acquired)    hypo   Myocardial infarction 11/2014   TIA   Polymyalgia rheumatica    2011 Dr. Kellie Simmering   Pulmonary emboli 02/06/2022   01/2022 PE/DVT post-op. Lower GI bleed     Treat edema  Start Eliquis at the reduced dose due to Creat>1.5 and age >53   Rheumatoid arthritis 2011   Dr.Truslow; fracture knees, hands and wrists -    S/P CABG x 1 1996   CABG--LIMA-LAD for ostial LAD (not felt to be PCI amenable). 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 Atretic LIMA. Minimal disease the RCA and Circumflex system.   CARPAL TUNNEL RELEASE Left    CATARACT EXTRACTION Bilateral    CERVICAL SPINE SURGERY     plate [id number] Dr. Wynetta Emeryram   COLONOSCOPY     CORONARY ARTERY BYPASS GRAFT  1996   INCLUDED AN INTERNAL MAMMARY ARTERY TO THE LAD. EF WAS NORMAL   ESOPHAGOGASTRODUODENOSCOPY (EGD) WITH PROPOFOL N/A 07/08/2022   Procedure: ESOPHAGOGASTRODUODENOSCOPY (EGD) WITH PROPOFOL;  Surgeon: Benancio DeedsArmbruster, Steven P, MD;  [facility];  Service: Gastroenterology;  Laterality: N/A;   INGUINAL HERNIA REPAIR Right    LAPAROSCOPIC APPENDECTOMY N/A 06/28/2016   Procedure: APPENDECTOMY LAPAROSCOPIC;  Surgeon: Romie LeveeAlicia Thomas, MD;  Location: [facility];  Service: General;  Laterality: N/A;   NM MYOVIEW LTD  06/2010   Fixed anteroseptal, apical and inferoapical defect with moderate size. Most likely scar. Mild subendocardial ischemia. EF 71% LOW RISK.    SPLENECTOMY     TOTAL HIP ARTHROPLASTY Left 01/23/2022   Procedure: LEFT TOTAL HIP ARTHROPLASTY ANTERIOR APPROACH;  Surgeon: Tarry KosXu, Naiping M, MD;  Location: [facility] OR;  Service: Orthopedics;  Laterality: Left;  3-C   TRANSTHORACIC ECHOCARDIOGRAM  12/2014   [facility]: Normal LV size & function. EF 55-60%,    vagina polyp     VIDEO ASSISTED THORACOSCOPY (VATS)/WEDGE RESECTION Left 03/02/2015   Procedure: VIDEO ASSISTED THORACOSCOPY (VATS), MINI THORACOTOMY, LEFT UPPER LOBE WEDGE, TAKE DOWN OF INTERNAL MAMMARY LESIONS, PLACEMENT OF ON-Q PUMP;  Surgeon: Delight OvensEdward B Gerhardt, MD;  Location: [facility] OR;  Service:  Thoracic;  Laterality: Left;   VIDEO BRONCHOSCOPY N/A 03/02/2015   Procedure: BRONCHOSCOPY;  Surgeon: Delight OvensEdward B Gerhardt, MD;  Location: [facility] OR;  Service: Thoracic;  Laterality: N/A;   VIDEO BRONCHOSCOPY WITH ENDOBRONCHIAL NAVIGATION N/A 10/08/2017   Procedure: VIDEO BRONCHOSCOPY WITH ENDOBRONCHIAL NAVIGATION WITH BIOPSIES OF LEFT UPPER LOBE AND LEFT LOWER LOBE;  Surgeon: Delight OvensGerhardt, Edward B, MD;  Location: [facility] OR;  Service: Thoracic;  Laterality: N/A;    Allergies  Allergen Reactions   Nsaids Other (See Comments)    Stomach upset Told to avoid due to Eliquis   Aspirin Other (See Comments)    Stomach upset Told to avoid due to Eliquis   Codeine Nausea And Vomiting   Nitrostat [Nitroglycerin] Other (See Comments)    Bradycardia. Drop in heart rate     Allergies as of 08/07/2022       Reactions   Nsaids Other (See Comments)   Stomach upset Told to avoid due to Eliquis   Aspirin Other (See Comments)   Stomach upset Told to avoid due to Eliquis   Codeine Nausea And Vomiting   Nitrostat [nitroglycerin] Other (See Comments)   Bradycardia. Drop in heart rate         Medication List        Accurate as of August 07, 2022 12:11 PM. If you have any questions, ask your nurse or doctor.  acetaminophen 325 MG tablet Commonly known as: TYLENOL Take 2 tablets (650 mg total) by mouth 2 (two) times daily as needed (generalized pain).   albuterol 108 (90 Base) MCG/ACT inhaler Commonly known as: VENTOLIN HFA Inhale 1-2 puffs into the lungs every 4 (four) hours as needed for wheezing or shortness of breath.   apixaban 2.5 MG Tabs tablet Commonly known as: ELIQUIS Take 1 tablet (2.5 mg total) by mouth 2 (two) times daily.   ascorbic acid 500 MG tablet Commonly known as: VITAMIN C Take 1 tablet (500 mg total) by mouth daily.   atorvastatin 80 MG tablet Commonly known as: LIPITOR Take 1 tablet (80 mg total) by mouth daily.   carvedilol 3.125 MG tablet Commonly known as:  COREG Take 1 tablet (3.125 mg total) by mouth 2 (two) times daily with a meal.   CENTRUM ADULT PO Take 1 tablet by mouth daily. 1 Tablet Daily.   Cyanocobalamin 2500 MCG Tabs Take 2,500 mcg by mouth daily.   docusate sodium 100 MG capsule Commonly known as: Colace Take 1-2 capsules (100-200 mg total) by mouth daily as needed for mild constipation.   gabapentin 300 MG capsule Commonly known as: NEURONTIN Take 1 capsule (300 mg total) by mouth at bedtime.   Golimumab 50 MG/0.5ML Soaj Take 50 mg by mouth every 2 (two) months. Hold for the next 2 weeks per Orthopedic surgery recommendations.   leflunomide 20 MG tablet Commonly known as: ARAVA Take 1 tablet (20 mg total) by mouth daily.   levothyroxine 88 MCG tablet Commonly known as: SYNTHROID Take 1 tablet (88 mcg total) by mouth daily before breakfast.   pantoprazole 40 MG tablet Commonly known as: PROTONIX Take 1 tablet (40 mg total) by mouth 2 (two) times daily. Take twice daily x 6 weeks, then once daily thereafter.   polyethylene glycol 17 g packet Commonly known as: MIRALAX / GLYCOLAX Take 17 g by mouth daily as needed.   triamcinolone 55 MCG/ACT Aero nasal inhaler Commonly known as: NASACORT Place 1 spray into the nose daily.   Vitamin D3 50 MCG (2000 UT) Caps Generic drug: Cholecalciferol Take 1 capsule (2,000 Units total) by mouth daily.        Review of Systems  Constitutional:  Negative for appetite change, fatigue and fever.  HENT:  Negative for congestion and trouble swallowing.   Eyes:  Negative for visual disturbance.  Respiratory:  Positive for shortness of breath. Negative for cough and wheezing.        DOE  Cardiovascular:  Positive for leg swelling.  Gastrointestinal:  Positive for constipation. Negative for abdominal pain.  Genitourinary:  Negative for difficulty urinating, dysuria and urgency.  Musculoskeletal:  Positive for arthralgias, back pain and gait problem. Negative for joint  swelling.  Skin:  Positive for wound.  Neurological:  Negative for tremors and headaches.  Psychiatric/Behavioral:  Negative for behavioral problems and sleep disturbance. The patient is not nervous/anxious.     Immunization History  Administered Date(s) Administered   Covid-19, Mrna,Vaccine(Spikevax)52yrs and older 06/09/2022   Fluad Quad(high Dose 65+) 02/03/2021, 01/16/2022   Influenza Whole 05/02/2001, 01/27/2008, 01/12/2010, 12/31/2010, 01/25/2012   Influenza, High Dose Seasonal PF 01/14/2014, 02/13/2015, 12/21/2016, 01/09/2018, 12/29/2018   Influenza,inj,Quad PF,6+ Mos 12/23/2015   Influenza-Unspecified 02/03/2013, 01/13/2014, 02/13/2015, 01/20/2020   Meningococcal polysaccharide vaccine (MPSV4) 03/21/2012   Moderna SARS-COV2 Booster Vaccination 03/15/2020   Moderna Sars-Covid-2 Vaccination 06/02/2019, 06/30/2019   PPD Test 11/18/2013   Pfizer Covid-19 Vaccine Bivalent Booster 57yrs & up 01/19/2021, 10/12/2021  Pneumococcal Conjugate-13 03/20/2013   Pneumococcal Polysaccharide-23 05/02/2000, 06/23/2010, 02/26/2018   Td 05/01/2006   Tdap 06/01/2016   Zoster Recombinat (Shingrix) 05/16/2017, 07/20/2017   Zoster, Live 01/17/2006   Pertinent  Health Maintenance Due  Topic Date Due   COLONOSCOPY (Pts 45-36yrs Insurance coverage will need to be confirmed)  03/30/2019   INFLUENZA VACCINE  11/30/2022   DEXA SCAN  Completed      03/30/2022   10:46 AM 05/23/2022   10:48 AM 05/23/2022   10:58 AM 06/13/2022   10:42 PM 08/07/2022   11:33 AM  Fall Risk  Falls in the past year? 0 0 1 1 1   Was there an injury with Fall? 0  0 0 1  Fall Risk Category Calculator 0  1 1 3   Fall Risk Category (Retired) Low      (RETIRED) Patient Fall Risk Level Low fall risk      Patient at Risk for Falls Due to No Fall Risks No Fall Risks Impaired balance/gait;History of fall(s)  History of fall(s)  Fall risk Follow up Falls evaluation completed Falls evaluation completed Falls evaluation completed  Falls  evaluation completed   Functional Status Survey:    Vitals:   08/07/22 1039  BP: (!) 140/76  Pulse: 76  Resp: 18  Temp: 97.8 F (36.6 C)  SpO2: 93%  Weight: 157 lb 3 oz (71.3 kg)  Height: 5\' 3"  (1.6 m)   Body mass index is 27.84 kg/m. Physical Exam Constitutional:      Appearance: Normal appearance.     Comments: Exhausted easily.   HENT:     Head: Normocephalic and atraumatic.     Nose:     Comments: Facial bruises, discomfort bridge of nose palpated.     Mouth/Throat:     Mouth: Mucous membranes are moist.  Eyes:     Extraocular Movements: Extraocular movements intact.     Conjunctiva/sclera: Conjunctivae normal.     Pupils: Pupils are equal, round, and reactive to light.  Cardiovascular:     Rate and Rhythm: Normal rate and regular rhythm.     Heart sounds: No murmur heard. Pulmonary:     Effort: Pulmonary effort is normal.     Breath sounds: No rales.  Abdominal:     General: Bowel sounds are normal.     Palpations: Abdomen is soft.     Tenderness: There is no abdominal tenderness.  Genitourinary:    Comments: External hemorrhoids from previous examination.  Musculoskeletal:     Cervical back: Normal range of motion and neck supple.     Right lower leg: Edema present.     Left lower leg: Edema present.     Comments: Moderate edema BLE L>R  Skin:    General: Skin is warm and dry.     Findings: Bruising present.     Comments: L hip surgical scar  Neurological:     General: No focal deficit present.     Mental Status: She is alert and oriented to person, place, and time. Mental status is at baseline.     Motor: No weakness.     Gait: Gait abnormal.  Psychiatric:        Mood and Affect: Mood normal.        Behavior: Behavior normal.        Thought Content: Thought content normal.     Labs reviewed: Recent Labs    07/22/22 0247 07/23/22 0645 07/24/22 1009 07/25/22 0533 08/01/22 0800 08/02/22 7425 08/02/22 9563  NA 136 137 138 139 141 142 141   K 3.7 3.8 4.3 4.0 4.2 3.8 3.8  CL 105 103 103 104 105 104 107  CO2 21* 22 21* 21* 25* 23  --   GLUCOSE 121* 107* 132* 91  --  110* 104*  BUN 63* 63* 65* 62* 57* 56* 56*  CREATININE 3.89* 3.95* 3.84* 3.71* 3.3* 3.35* 3.60*  CALCIUM 8.8* 9.1 9.3 9.6 10.0 10.0  --   MG 1.3* 1.8  --  1.9  --   --   --   PHOS 4.4 4.8* 4.9* 5.4*  --   --   --    Recent Labs    07/04/22 0709 07/14/22 0747 07/19/22 0237 07/25/22 0533 08/01/22 0800 08/02/22 0918  AST 25 13*  --   --  14 18  ALT 17 5  --   --  10 13  ALKPHOS 93 88  --   --  78 71  BILITOT 0.7 0.9  --   --   --  0.9  PROT 5.4* 6.3*  --   --   --  6.8  ALBUMIN 2.2* 3.4*   < > 2.9* 3.7 3.3*   < > = values in this interval not displayed.   Recent Labs    07/19/22 0237 07/19/22 2134 07/20/22 0423 07/22/22 0247 07/23/22 0327 07/25/22 0533 08/01/22 0800 08/02/22 0918 08/02/22 0927  WBC 10.8*  --  11.1*   < > 8.3 8.1 8.7 10.0  --   NEUTROABS 6.0  --  6.7  --   --   --  4,750.00  --   --   HGB 7.0*   < > 7.6*   < > 7.9* 8.1* 8.2* 8.3* 9.2*  HCT 20.5*   < > 22.4*   < > 24.1* 24.8* 25* 25.9* 27.0*  MCV 92.8  --  92.2   < > 93.1 94.7  --  97.4  --   PLT 285  --  272   < > 220 321 420* 383  --    < > = values in this interval not displayed.   Lab Results  Component Value Date   TSH 0.65 08/01/2022   No results found for: "HGBA1C" Lab Results  Component Value Date   CHOL 157 09/07/2020   HDL 59.30 09/07/2020   LDLCALC 74 09/07/2020   LDLDIRECT 162.1 10/28/2009   TRIG 121.0 09/07/2020   CHOLHDL 3 09/07/2020    Significant Diagnostic Results in last 30 days:  DG Knee AP/LAT W/Sunrise Right  Result Date: 08/02/2022 CLINICAL DATA:  Anterior knee pain after falling. On blood thinners. Bruising and swelling. EXAM: RIGHT KNEE 3 VIEWS COMPARISON:  Radiographs 02/15/2020 FINDINGS: The bones appear mildly demineralized. There is no evidence of acute fracture or dislocation. Tricompartmental degenerative changes are again noted, most  advanced in the lateral compartment. There is meniscal chondrocalcinosis. There is a probable small nonspecific knee joint effusion without evidence of lipohemarthrosis. The soft tissues appear edematous. No evidence of foreign body or soft tissue emphysema. IMPRESSION: No evidence of acute fracture or dislocation. Tricompartmental degenerative changes with probable small nonspecific knee joint effusion. Electronically Signed   By: Carey Bullocks M.D.   On: 08/02/2022 11:17   DG Hand Complete Right  Result Date: 08/02/2022 CLINICAL DATA:  Fall.  Ring and small finger bruising. EXAM: RIGHT HAND - COMPLETE 3+ VIEW COMPARISON:  None Available. FINDINGS: There is no evidence of fracture or dislocation. There is no evidence of arthropathy  or other focal bone abnormality. Soft tissues are unremarkable. Chondrocalcinosis of the TFCC. IMPRESSION: 1. No acute osseous abnormality. Electronically Signed   By: Obie Dredge M.D.   On: 08/02/2022 10:18   CT CERVICAL SPINE WO CONTRAST  Result Date: 08/02/2022 CLINICAL DATA:  82 year old female status post fall from standing. Struck head on concrete floor. On Eliquis. EXAM: CT CERVICAL SPINE WITHOUT CONTRAST TECHNIQUE: Multidetector CT imaging of the cervical spine was performed without intravenous contrast. Multiplanar CT image reconstructions were also generated. RADIATION DOSE REDUCTION: This exam was performed according to the departmental dose-optimization program which includes automated exposure control, adjustment of the mA and/or kV according to patient size and/or use of iterative reconstruction technique. COMPARISON:  CT head and face today.  Cervical spine CT 02/15/2020. FINDINGS: Alignment: Stable compared to 2021. Cervicothoracic junction alignment is within normal limits. Bilateral posterior element alignment is within normal limits. Skull base and vertebrae: Visualized skull base is intact. No atlanto-occipital dissociation. C1 and C2 appear intact and  aligned. Chronic postoperative details are below. No acute osseous abnormality identified. Soft tissues and spinal canal: No prevertebral fluid or swelling. No visible canal hematoma. Negative noncontrast visible neck soft tissues aside from calcified left carotid atherosclerosis. Disc levels: Chronic C3-C4 and C4-C5 ACDF with solid arthrodesis. Bulky chronic C5-C6 disc and endplate degeneration. Advanced chronic C6-C7 and C7-T1 disc degeneration. Mild if any associated cervical spinal stenosis. Upper chest: Grossly intact visible upper thoracic levels. Superimposed respiratory motion artifact. Partially visible apical lung scarring with some superimposed pulmonary septal thickening. Staple line in the left lung apex might be new since 2021, uncertain. Prior sternotomy. IMPRESSION: 1. No acute traumatic injury identified in the cervical spine. 2. Chronic C3-C4 and C4-C5 ACDF with solid arthrodesis. 3. Motion artifact in the upper chest. Electronically Signed   By: Odessa Fleming M.D.   On: 08/02/2022 09:58   CT MAXILLOFACIAL WO CONTRAST  Result Date: 08/02/2022 CLINICAL DATA:  82 year old female status post fall from standing. Struck head on concrete floor. On Eliquis. EXAM: CT MAXILLOFACIAL WITHOUT CONTRAST TECHNIQUE: Multidetector CT imaging of the maxillofacial structures was performed. Multiplanar CT image reconstructions were also generated. RADIATION DOSE REDUCTION: This exam was performed according to the departmental dose-optimization program which includes automated exposure control, adjustment of the mA and/or kV according to patient size and/or use of iterative reconstruction technique. COMPARISON:  Head and cervical spine CT today. Previous head CT 07/02/2022. FINDINGS: Osseous: Mandible intact and normally located. No acute dental finding. Mildly comminuted minimally displaced bilateral superior nasal bone fractures (series 8, image 43) with regional soft tissue swelling. Mild associated buckling of the  bony nasal septum on series 5, image 58 is new from last month. But bilateral maxilla, zygoma, and pterygoid bones appear intact. Central skull base appears intact. Cervical spine is detailed separately. Orbits: Intact orbital walls. Bilateral orbit medial canthus, preseptal soft tissue swelling and stranding in conjunction with nasal bridge and forehead soft tissue injury. Postoperative changes to both globes. Bilateral intraorbital soft tissues remain normal. Sinuses: Retained secretions in the nasal cavity associated with anterior nasal septal injury. No discrete nasal septal hematoma is evident. Paranasal sinuses, tympanic cavities and mastoids remain well aerated. Soft tissues: Retained secretions in the nasopharynx. Negative visible other noncontrast deep soft tissue spaces of the face except for bulky left cervical carotid calcified atherosclerosis. Limited intracranial: Stable to that reported separately. IMPRESSION: 1. Mildly comminuted and minimally displaced fractures of the superior nasal bones, as well as the bony nasal septum. Regional  soft tissue swelling. Retained secretions in the nasal cavity and nasopharynx. 2. No other facial fracture identified. Electronically Signed   By: Odessa Fleming M.D.   On: 08/02/2022 09:54   CT HEAD WO CONTRAST  Result Date: 08/02/2022 CLINICAL DATA:  82 year old female status post fall from standing. Struck head on concrete floor. On Eliquis. EXAM: CT HEAD WITHOUT CONTRAST TECHNIQUE: Contiguous axial images were obtained from the base of the skull through the vertex without intravenous contrast. RADIATION DOSE REDUCTION: This exam was performed according to the departmental dose-optimization program which includes automated exposure control, adjustment of the mA and/or kV according to patient size and/or use of iterative reconstruction technique. COMPARISON:  Face and cervical spine CT today. Prior head CT 07/02/2022. FINDINGS: Brain: Cerebral volume is stable and within  normal limits for age. No midline shift, ventriculomegaly, mass effect, evidence of mass lesion, intracranial hemorrhage or evidence of cortically based acute infarction. Gray-white differentiation is stable and within normal limits for age. Vascular: Calcified atherosclerosis at the skull base. No suspicious intracranial vascular hyperdensity. Skull: No skull fracture identified. Facial bones are detailed separately. Sinuses/Orbits: Visualized paranasal sinuses and mastoids are stable and well aerated. Other: Broad-based up to 13 mm thick anterior forehead scalp hematoma. Underlying frontal bones and frontal sinuses appear intact. See also face CT reported separately. IMPRESSION: 1. Broad-based forehead scalp hematoma. No skull fracture identified, Face CT reported separately. 2. Stable and normal for age non contrast CT appearance of the brain. Electronically Signed   By: Odessa Fleming M.D.   On: 08/02/2022 09:49   DG CHEST PORT 1 VIEW  Result Date: 07/15/2022 CLINICAL DATA:  Hypoxia EXAM: PORTABLE CHEST 1 VIEW COMPARISON:  07/11/2022 FINDINGS: Prior CABG. Heart is borderline in size. Mediastinal contours within normal limits. Bilateral lower lobe airspace opacities have increased since prior study. Small bilateral effusions. No acute bony abnormality. IMPRESSION: Worsening bibasilar atelectasis or infiltrates with small effusions. Electronically Signed   By: Charlett Nose M.D.   On: 07/15/2022 20:13   DG CHEST PORT 1 VIEW  Result Date: 07/11/2022 CLINICAL DATA:  Hypoxia EXAM: PORTABLE CHEST 1 VIEW COMPARISON:  CXR 07/08/22 FINDINGS: Small bilateral pleural effusions. Status post median sternotomy. No pneumothorax. Bibasilar airspace opacities which could represent atelectasis or infection. Cardiac and mediastinal contours are unchanged. No radiographically apparent displaced rib fractures. Visualized upper abdomen is unremarkable. IMPRESSION: 1. Small bilateral pleural effusions. 2. Redemonstrated bibasilar  airspace opacities, which could represent atelectasis or infection. Electronically Signed   By: Lorenza Cambridge M.D.   On: 07/11/2022 16:55   US RENAL  Result Date: 07/11/2022 CLINICAL DATA:  Acute kidney injury in 82 year old female. EXAM: RENAL / URINARY TRACT ULTRASOUND COMPLETE COMPARISON:  MRI from November 10, 2021 FINDINGS: Right Kidney: Renal measurements: 9.6 x 4.1 x 4.2 cm = volume: 88 mL. No signs of hydronephrosis. Increased cortical echogenicity with marked renal cortical/parenchymal scarring. Cysts of the RIGHT kidney largest 3.9 x 4.7 x 3.6 cm in the upper pole with several smaller cysts not substantially changed compared to previous imaging for which no additional dedicated follow-up imaging is recommend. Left Kidney: Renal measurements: 9.8 x 4.5 x 4.4 cm = volume: 103 mL. Cysts on the LEFT as well largest 2.5 x 1.8 x 2.1 cm for which no additional dedicated follow-up imaging is recommended. Increased cortical echogenicity and parenchymal scarring is very similar to the recent/prior MRI evaluation. There is no hydronephrosis. Bladder: Urinary bladder is decompressed not well evaluated. Other: None. IMPRESSION: 1. No signs of  hydronephrosis. 2. Increased cortical echogenicity and parenchymal scarring in both kidneys as can be seen in the setting of chronic medical renal disease. Electronically Signed   By: Donzetta Kohut M.D.   On: 07/11/2022 09:05    Assessment/Plan  Nasal bones, closed fracture ED 08/02/22 fall, facial bruises, sustained Mildly comminuted and minimally displaced fractures of the superior nasal bones, as well as the bony nasal septum, improved right hand and right knee pain, CT head/cervical spine, X-ray R hand, R knee showed no acute process 08/07/22 minimal c/o pain, surgeon consultation if needed.   GERD (gastroesophageal reflux disease) EGD3/9/24 10 gastric ulcers (negative H pylori/malignancy), placed PPI bid x 6 wks, then daily.   History of pulmonary embolism 02/06/22  acute PE R middle lobe,  on Eliquis now, treat with shortest course possible, dc Eliquis if bleeds again  History of DVT (deep vein thrombosis) 01/28/22 [place] age indeterminate deep vein thrombosis  involving the right peroneal veins, on Eliquis  COPD (chronic obstructive pulmonary disease) (HCC)  prn Albuterol ACT, Triamcinolone nasal spray, O2 2lpm  Essential hypertension  takes Coreg, off Amlodipine by nephrology, loose control blood pressure.   Anemia take Vit B12, Fe, Hgb 8.1 07/25/22<<9.2 08/02/22, EPO per Nephrology.   Slow transit constipation Adding Psyllium daily, c/o a few times small BM a day.   Rheumatoid arthritis (HCC) takes Golimumab,  Leflunomide, Gabapentin, Hx of fx of knees, hands, wrists, cervical disc disease/fracture  Acquired hypothyroidism  takes Levothyroxine, TSH 0.65 08/01/22  Aortic atherosclerosis (HCC)  CAD s/p CABG x1, LIMA LAD for ostial LAD, EF wnl. S/p aneurysm repair, on Coreg, Statin  CKD (chronic kidney disease) stage 4, GFR 15-29 ml/min (HCC) - baseline SCr 2.5. follows with Dr. Malen Gauze with [facility] Bun/creat 56/3.6 08/01/32, EPO,  followed by Nephrology   Family/ staff Communication: plan of care reviewed with the patient and charge nurse.   Labs/tests ordered:  none  Time spend 35 minutes.

## 2022-08-07 NOTE — Assessment & Plan Note (Signed)
take Vit B12, Fe, Hgb 8.1 07/25/22<<9.2 08/02/22, EPO per Nephrology.

## 2022-08-07 NOTE — Assessment & Plan Note (Signed)
EGD3/9/24 10 gastric ulcers (negative H pylori/malignancy), placed PPI bid x 6 wks, then daily.

## 2022-08-07 NOTE — Telephone Encounter (Signed)
        Patient  visited [place] on 4/3   Telephone encounter attempt :  2nd  A HIPAA compliant voice message was left requesting a return call.  Instructed patient to call back .   Lenard Forth [facility] Guide, [facility] [phone] [address] [place], [place], [place] [address] Phone: [phone] Email: [email or web address]

## 2022-08-07 NOTE — Assessment & Plan Note (Signed)
ED 08/02/22 fall, facial bruises, sustained Mildly comminuted and minimally displaced fractures of the superior nasal bones, as well as the bony nasal septum, improved right hand and right knee pain, CT head/cervical spine, X-ray R hand, R knee showed no acute process 08/07/22 minimal c/o pain, surgeon consultation if needed.

## 2022-08-07 NOTE — Telephone Encounter (Signed)
        Patient  visited [place] on 4/3     Telephone encounter attempt :  1st  A HIPAA compliant voice message was left requesting a return call.  Instructed patient to call back     Khadijah Mastrianni [facility] Guide, [place] [phone] [address], [place], [place] [address] Phone: [phone] Email: [email or web address]       

## 2022-08-07 NOTE — Assessment & Plan Note (Signed)
takes Levothyroxine, TSH 0.65 08/01/22

## 2022-08-07 NOTE — Assessment & Plan Note (Signed)
Adding Psyllium daily, c/o a few times small BM a day.

## 2022-08-07 NOTE — Assessment & Plan Note (Signed)
BLE, resumed since admitted to SNF [facility] Furosemide, off Kcl per nephrology in the past.

## 2022-08-07 NOTE — Assessment & Plan Note (Signed)
Bun/creat 56/3.6 08/01/32, EPO,  followed by Nephrology

## 2022-08-07 NOTE — Assessment & Plan Note (Signed)
CAD s/p CABG x1, LIMA LAD for ostial LAD, EF wnl. S/p aneurysm repair, on Coreg, Statin

## 2022-08-07 NOTE — Assessment & Plan Note (Signed)
takes Coreg, off Amlodipine by nephrology, loose control blood pressure.

## 2022-08-07 NOTE — Assessment & Plan Note (Signed)
prn Albuterol ACT, Triamcinolone nasal spray, O2 2lpm 

## 2022-08-07 NOTE — Assessment & Plan Note (Signed)
01/28/22 US age indeterminate deep vein thrombosis  involving the right peroneal veins, on Eliquis 

## 2022-08-08 ENCOUNTER — Ambulatory Visit: Payer: Self-pay

## 2022-08-08 ENCOUNTER — Telehealth: Payer: Self-pay | Admitting: *Deleted

## 2022-08-08 NOTE — Patient Outreach (Signed)
  Care Coordination      Visit Note   08/08/2022 Name: AZHARIA MIKOLAJCZAK MRN: [id number] DOB: 1940-10-21  MARILENE GOLDBERG is a 82 y.o. year old female who sees Plotnikov, Georgina Quint, MD for primary care. No patient contact was made during this encounter  Patient scheduled for telephone assessment today. Per review of chart: Patient is currently residing at SNF at this time.  Noted: 03/06/22 dislocation of left hip, AKI d/c to SNF; 07/02/22 sepsis acute pyelonephritis d/c to SNF; ED visit from SNF on 08/02/22 fall, fracture of nasal bone, hypertensive urgency. Currently in SNF at this time.    SDOH assessments and interventions completed:  No  Care Coordination Interventions:  No, not indicated   Follow up plan:  Care guide to outreach to schedule once discharged from SNF    Encounter Outcome:  Pt. Visit Completed   Kathyrn Sheriff, RN, MSN, BSN, CCM [facility] Care Coordinator [phone]

## 2022-08-08 NOTE — Progress Notes (Signed)
  Care Coordination Note  08/08/2022 Name: HANEEFAH HOPE MRN: [id number] DOB: [birth year]  ANUREET PINELA is a 82 y.o. year old female who is a primary care patient of Plotnikov, Georgina Quint, MD and is actively engaged with the care management team. I reached out to [name] by phone today to assist with re-scheduling a follow up visit with the RN Case Manager  Follow up plan: Pt in SNF - LM for husband - will f/u    Burman Nieves, [facility] Care Coordination Care Guide Direct Dial: [phone]

## 2022-08-10 ENCOUNTER — Other Ambulatory Visit: Payer: Self-pay | Admitting: Nurse Practitioner

## 2022-08-10 DIAGNOSIS — S022XXD Fracture of nasal bones, subsequent encounter for fracture with routine healing: Secondary | ICD-10-CM

## 2022-08-14 ENCOUNTER — Other Ambulatory Visit: Payer: Self-pay | Admitting: Nurse Practitioner

## 2022-08-14 DIAGNOSIS — R0609 Other forms of dyspnea: Secondary | ICD-10-CM

## 2022-08-15 DIAGNOSIS — E875 Hyperkalemia: Secondary | ICD-10-CM | POA: Diagnosis not present

## 2022-08-15 DIAGNOSIS — N179 Acute kidney failure, unspecified: Secondary | ICD-10-CM | POA: Diagnosis not present

## 2022-08-15 DIAGNOSIS — I129 Hypertensive chronic kidney disease with stage 1 through stage 4 chronic kidney disease, or unspecified chronic kidney disease: Secondary | ICD-10-CM | POA: Diagnosis not present

## 2022-08-15 DIAGNOSIS — K922 Gastrointestinal hemorrhage, unspecified: Secondary | ICD-10-CM | POA: Diagnosis not present

## 2022-08-15 DIAGNOSIS — I2510 Atherosclerotic heart disease of native coronary artery without angina pectoris: Secondary | ICD-10-CM | POA: Diagnosis not present

## 2022-08-15 DIAGNOSIS — N184 Chronic kidney disease, stage 4 (severe): Secondary | ICD-10-CM | POA: Diagnosis not present

## 2022-08-15 DIAGNOSIS — Z9889 Other specified postprocedural states: Secondary | ICD-10-CM | POA: Diagnosis not present

## 2022-08-22 LAB — LAB REPORT - SCANNED: EGFR: 12

## 2022-08-24 ENCOUNTER — Non-Acute Institutional Stay (SKILLED_NURSING_FACILITY): Payer: Medicare PPO | Admitting: Nurse Practitioner

## 2022-08-24 ENCOUNTER — Encounter: Payer: Self-pay | Admitting: Nurse Practitioner

## 2022-08-24 DIAGNOSIS — S022XXD Fracture of nasal bones, subsequent encounter for fracture with routine healing: Secondary | ICD-10-CM

## 2022-08-24 DIAGNOSIS — J432 Centrilobular emphysema: Secondary | ICD-10-CM | POA: Diagnosis not present

## 2022-08-24 DIAGNOSIS — G4733 Obstructive sleep apnea (adult) (pediatric): Secondary | ICD-10-CM

## 2022-08-24 DIAGNOSIS — K219 Gastro-esophageal reflux disease without esophagitis: Secondary | ICD-10-CM | POA: Diagnosis not present

## 2022-08-24 DIAGNOSIS — Z86711 Personal history of pulmonary embolism: Secondary | ICD-10-CM

## 2022-08-24 DIAGNOSIS — I700 Atherosclerosis of aorta: Secondary | ICD-10-CM

## 2022-08-24 DIAGNOSIS — E782 Mixed hyperlipidemia: Secondary | ICD-10-CM | POA: Diagnosis not present

## 2022-08-24 DIAGNOSIS — M069 Rheumatoid arthritis, unspecified: Secondary | ICD-10-CM

## 2022-08-24 DIAGNOSIS — G459 Transient cerebral ischemic attack, unspecified: Secondary | ICD-10-CM

## 2022-08-24 DIAGNOSIS — E559 Vitamin D deficiency, unspecified: Secondary | ICD-10-CM | POA: Diagnosis not present

## 2022-08-24 DIAGNOSIS — N184 Chronic kidney disease, stage 4 (severe): Secondary | ICD-10-CM

## 2022-08-24 DIAGNOSIS — I10 Essential (primary) hypertension: Secondary | ICD-10-CM

## 2022-08-24 DIAGNOSIS — E039 Hypothyroidism, unspecified: Secondary | ICD-10-CM

## 2022-08-24 DIAGNOSIS — D631 Anemia in chronic kidney disease: Secondary | ICD-10-CM

## 2022-08-24 MED ORDER — CYANOCOBALAMIN 2500 MCG PO TABS: 2500.0000 ug | ORAL_TABLET | Freq: Every day | ORAL | 3 refills | Status: DC

## 2022-08-24 MED ORDER — GABAPENTIN 300 MG PO CAPS: 300.0000 mg | ORAL_CAPSULE | Freq: Every day | ORAL | 1 refills | Status: DC

## 2022-08-24 MED ORDER — LEVOTHYROXINE SODIUM 88 MCG PO TABS: 88.0000 ug | ORAL_TABLET | Freq: Every day | ORAL | 1 refills | Status: DC

## 2022-08-24 MED ORDER — LEFLUNOMIDE 20 MG PO TABS: 20.0000 mg | ORAL_TABLET | Freq: Every day | ORAL | 1 refills | Status: DC

## 2022-08-24 MED ORDER — APIXABAN 2.5 MG PO TABS: 2.5000 mg | ORAL_TABLET | Freq: Two times a day (BID) | ORAL | 1 refills | Status: DC

## 2022-08-24 MED ORDER — DOCUSATE SODIUM 100 MG PO CAPS: 100.0000 mg | ORAL_CAPSULE | Freq: Every day | ORAL | 1 refills | Status: DC | PRN

## 2022-08-24 MED ORDER — CARVEDILOL 3.125 MG PO TABS: 3.1250 mg | ORAL_TABLET | Freq: Two times a day (BID) | ORAL | 1 refills | Status: DC

## 2022-08-24 MED ORDER — PANTOPRAZOLE SODIUM 40 MG PO TBEC: 40.0000 mg | DELAYED_RELEASE_TABLET | Freq: Every day | ORAL | 1 refills | Status: DC

## 2022-08-24 MED ORDER — ATORVASTATIN CALCIUM 80 MG PO TABS: 80.0000 mg | ORAL_TABLET | Freq: Every day | ORAL | 1 refills | Status: DC

## 2022-08-24 MED ORDER — TRIAMCINOLONE ACETONIDE 55 MCG/ACT NA AERO: 1.0000 | INHALATION_SPRAY | Freq: Every day | NASAL | 3 refills | Status: DC

## 2022-08-24 MED ORDER — ACETAMINOPHEN 325 MG PO TABS: 650.0000 mg | ORAL_TABLET | Freq: Two times a day (BID) | ORAL | 2 refills | Status: DC | PRN

## 2022-08-24 MED ORDER — POLYETHYLENE GLYCOL 3350 17 G PO PACK: 17.0000 g | PACK | Freq: Every day | ORAL | 1 refills | Status: DC | PRN

## 2022-08-24 NOTE — Assessment & Plan Note (Signed)
Bun/creat 56/3.6 08/02/22, resumed EPO,  followed by Nephrology

## 2022-08-24 NOTE — Assessment & Plan Note (Signed)
takes Golimumab,  Leflunomide, Gabapentin, Hx of fx of knees, hands, wrists, cervical disc disease/fracture 

## 2022-08-24 NOTE — Assessment & Plan Note (Signed)
take Vit B12, Fe, Hgb 8.1 07/25/22<<9.2 08/02/22, EPO per Nephrology.  

## 2022-08-24 NOTE — Assessment & Plan Note (Signed)
prn Albuterol ACT, Triamcinolone nasal spray, O2 2lpm 

## 2022-08-24 NOTE — Assessment & Plan Note (Signed)
02/06/22-02/11/22 for acute PE R middle lobe,  on Eliquis now, treat with shortest course possible, dc Eliquis if bleeds again             DVT 01/28/22 [place] age indeterminate deep vein thrombosis  involving the right peroneal veins, on Eliquis

## 2022-08-24 NOTE — Assessment & Plan Note (Signed)
on Atorvastatin, Eliquis.  

## 2022-08-24 NOTE — Assessment & Plan Note (Signed)
s/p CABG x1, LIMA LAD for ostial LAD, EF wnl. S/p aneurysm repair, on Coreg, Statin 

## 2022-08-24 NOTE — Progress Notes (Signed)
Location:  [facility] Room Number: 66-B Place of Service:  SNF (31)  Provider: Lexy Meininger Dicie Beam  PCP: [name], MD Patient Care Team: [name], MD as PCP - General (Internal Medicine) [name], MD as PCP - Cardiology (Cardiology) [name], MD as Consulting Physician (Cardiology) [name], MD as Consulting Physician (Rheumatology) Armbruster, [name], MD as Consulting Physician (Gastroenterology) [name], MD as Consulting Physician (Pulmonary Disease) [name], MD (Inactive) as Consulting Physician (Cardiothoracic Surgery) [name], MD as Consulting Physician (Obstetrics and Gynecology) Plotnikov, Georgina Quint, MD Colletta Maryland, RN as [facility] Care Management  Extended Emergency Contact Information Primary Emergency Contact: [name] Address: [address]           [place], [place] [address] Darden Amber of [place] Home Phone: [phone] Mobile Phone: [phone] Relation: Spouse Secondary Emergency Contact: [facility] Address: [address]          [place], [place] [address] Darden Amber of [place] Mobile Phone: [phone] Relation: Son  Code Status: DNR Goals of care:  Advanced Directive information    08/24/2022   11:38 AM  Advanced Directives  Does Patient Have a Medical Advance Directive? Yes  Type of Advance Directive Out of facility DNR (pink MOST or yellow form)  Does patient want to make changes to medical advance directive? No - Patient declined     Allergies  Allergen Reactions   Nsaids Other (See Comments)    Stomach upset Told to avoid due to Eliquis   Aspirin Other (See Comments)    Stomach upset Told to avoid due to Eliquis   Codeine Nausea And Vomiting   Nitrostat [Nitroglycerin] Other (See Comments)    Bradycardia. Drop in heart rate     Chief Complaint  Patient presents with   Discharge Note    [place]    HPI:  82 y.o. female  with HPI significant of CKD stage 4, PE/DVT, COPD, stroke/TIA, CAD, hypothyroidism, RA, and GI bleed/gastric ulcers, anemia, HTN was [facility] for [facility]. Her [facility] stay was complicated with a mechanical fall resulted in nose fx.    The patient has regained her physical strength, ambulates with walker, her ADL function, she is stable [place] to continue therapy   Nose fx, 08/02/22 sustained from a mechanical fall, pending ENT   Clinical presumed sleep apnea, pending sleep study    Hospitalized 07/02/22-07/25/22 for acute R pyelonephritis and sepsis, blood culture and urine culture positive E Coli, fully treated. The hospital stay was complicated with AKI, blood loss anemia-EGD3/9/24 10 gastric ulcers (negative H pylori/malignancy), placed PPI bid x 6 wks, then daily.                 Hospitalized 03/06/22-03/09/22, fell, left hip posterior dislocation, reduced in ER 03/06/22, followed by Ortho, left knee immobilizer use, SN[facility] for therapy             Hospitalized 02/16/22-02/22/22 rectal bleed, external hemorrhoids, gb 6.1, PRBC x2 units transfused, f/u GI              Hospitalized 02/06/22-02/11/22 for acute PE R middle lobe,  on Eliquis now, treat with shortest course possible, dc Eliquis if bleeds again             DVT 01/28/22 [place] age indeterminate deep vein thrombosis  involving the right  peroneal veins, on Eliquis             01/23/22 s/p total replacement of L hip, f/u Dr Roda Shutters.             DOE/COPD, prn Albuterol ACT, Triamcinolone nasal spray, O2 2lpm  BRBPR hemorrhoids, Hydrocortisone cream, avoid constipation             HTN, takes Coreg, off Amlodipine by nephrology.              HLD takes Atorvastatin, LDL 74 09/07/20             Vit D deficiency, takes Vit D             Anemia, take Vit B12, Fe, Hgb 8.1 07/25/22<<9.2 08/02/22, EPO per Nephrology.              Constipation, stable             GERD/  EGD3/9/24 10 gastric ulcers (negative H pylori/malignancy), placed on PPI bid x 6 wks, then daily. prn Zofran             Hx of L GI diverticular bleed, on PPI             RA, takes Golimumab,  Leflunomide, Gabapentin, Hx of fx of knees, hands, wrists, cervical disc disease/fracture             Hypothyroidism, takes Levothyroxine, TSH 0.65 08/01/22             CAD s/p CABG x1, LIMA LAD for ostial LAD, EF wnl. S/p aneurysm repair, on Coreg, Statin             CKD, Bun/creat 56/3.6 08/02/22, resumed EPO,  followed by Nephrology             TIA/Stroke, on Atorvastatin, Eliquis.              PMR, Hx of              Edema, BLE, resumed Furosemide at [facility], Kcl per nephrology.  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 Most likely scar. Mild subendocardial ischemia. EF 71% LOW RISK.    Cervical disc disease    fracture   Chronic kidney disease    COPD (chronic obstructive pulmonary disease) (HCC)    Diverticulosis    DVT (deep venous thrombosis) (HCC)    RLE; Sept '23   History of DVT (deep vein thrombosis) 03/06/2022   History of pulmonary embolus (PE)    Oct '23   Hyperlipidemia    Hypertension    Hypothyroidism (acquired)    hypo   Myocardial infarction (HCC) 11/2014   TIA   Polymyalgia rheumatica (HCC)    2011 Dr. Kellie Simmering  Pulmonary emboli (HCC) 02/06/2022   01/2022 PE/DVT post-op. Lower GI bleed     Treat edema  Start Eliquis at the reduced dose due to Creat>1.5 and age >3   Rheumatoid arthritis (HCC) 2011   Dr.Truslow; fracture knees, hands and wrists -    S/P CABG x 1 1996   CABG--LIMA-LAD for ostial LAD (not felt to be PCI amenable). EF NORMAL then; LIMA now atretic   Shortness of breath dyspnea    with exertion   Stroke (HCC) 11/2014   TIA    Urinary frequency     Past Surgical History:  Procedure Laterality Date   ABDOMINAL AORTIC ANEURYSM REPAIR  1996   Complicated by mesenteric artery stenosis and splenic artery infarction with acquired Asplenia   APPENDECTOMY     BIOPSY  07/08/2022   Procedure: BIOPSY;  Surgeon: Benancio Deeds, MD;  Location: [facility] ENDOSCOPY;  Service: Gastroenterology;;   [name]  07/2011   right foot   CARDIAC CATHETERIZATION  2005   (Most recent CATH) - ostial LAD lesion 20-30% (down from 90% initially). Atretic LIMA. Minimal disease the RCA and Circumflex system.   CARPAL TUNNEL RELEASE Left    CATARACT EXTRACTION Bilateral    CERVICAL SPINE SURGERY     plate [id number] Dr. Wynetta Emery   COLONOSCOPY     CORONARY ARTERY BYPASS GRAFT  1996   INCLUDED AN INTERNAL MAMMARY ARTERY TO THE LAD. EF WAS NORMAL   ESOPHAGOGASTRODUODENOSCOPY (EGD) WITH PROPOFOL N/A 07/08/2022   Procedure: ESOPHAGOGASTRODUODENOSCOPY (EGD) WITH PROPOFOL;  Surgeon: Benancio Deeds, MD;  Location[facility] ENDOSCOPY;  Service: Gastroenterology;  Laterality: N/A;   INGUINAL HERNIA REPAIR Right    LAPAROSCOPIC APPENDECTOMY N/A 06/28/2016   Procedure: APPENDECTOMY LAPAROSCOPIC;  Surgeon: [name], MD;  Location: [facility] ORS;  Service: General;  Laterality: N/A;   NM MYOVIEW LTD  06/2010   Fixed anteroseptal, apical and inferoapical defect with moderate size. Most likely scar. Mild subendocardial ischemia. EF 71% LOW RISK.    SPLENECTOMY     TOTAL HIP ARTHROPLASTY Left 01/23/2022   Procedure: LEFT TOTAL HIP  ARTHROPLASTY ANTERIOR APPROACH;  Surgeon: [name], MD;  Location: [facility] OR;  Service: Orthopedics;  Laterality: Left;  3-C   TRANSTHORACIC ECHOCARDIOGRAM  12/2014   [facility]: Normal LV size & function. EF 55-60%,    vagina polyp     VIDEO ASSISTED THORACOSCOPY (VATS)/WEDGE RESECTION Left 03/02/2015   Procedure: VIDEO ASSISTED THORACOSCOPY (VATS), MINI THORACOTOMY, LEFT UPPER LOBE WEDGE, TAKE DOWN OF INTERNAL MAMMARY LESIONS, PLACEMENT OF ON-Q PUMP;  Surgeon: [name], MD;  Location: [facility] OR;  Service: Thoracic;  Laterality: Left;   VIDEO BRONCHOSCOPY N/A 03/02/2015   Procedure: BRONCHOSCOPY;  Surgeon: [name], MD;  Location: [facility] OR;  Service: Thoracic;  Laterality: N/A;   VIDEO BRONCHOSCOPY WITH ENDOBRONCHIAL NAVIGATION N/A 10/08/2017   Procedure: VIDEO BRONCHOSCOPY WITH ENDOBRONCHIAL NAVIGATION WITH BIOPSIES OF LEFT UPPER LOBE AND LEFT LOWER LOBE;  Surgeon: [name], MD;  Location: [facility] OR;  Service: Thoracic;  Laterality: N/A;      reports that she quit smoking about 63 years ago. Her smoking use included cigarettes. She has never been exposed to tobacco smoke. She has never used smokeless tobacco. She reports current alcohol use. She reports that she does not use drugs. Social History   Socioeconomic History   Marital status: Married    Spouse name: Not on file   Number of children: 4   Years of education: Not on file  Highest education level: Not on file  Occupational History   Occupation: retired    Associate Professor: RETIRED  Tobacco Use   Smoking status: Former    Types: Cigarettes    Quit date: 12/04/1958    Years since quitting: 63.7    Passive exposure: Never   Smokeless tobacco: Never  Vaping Use   Vaping Use: Never used  Substance and Sexual Activity   Alcohol use: Yes    Comment: hardly ever   Drug use: No   Sexual activity: Not Currently  Other Topics Concern   Not on file  Social History Narrative   [facility]   MARRIED -RETIRED ; 4 CHILDREN,2 CHILDREN.    [facility] since dec 2015   Social Determinants of Health   Financial Resource Strain: Low Risk  (06/13/2022)   Overall Financial Resource Strain (CARDIA)    Difficulty of Paying Living Expenses: Not hard at all  Food Insecurity: No Food Insecurity (06/13/2022)   Hunger Vital Sign    Worried About Running Out of Food in the Last Year: Never true    Ran Out of Food in the Last Year: Never true  Transportation Needs: No Transportation Needs (06/13/2022)   PRAPARE - Administrator, Civil Service (Medical): No    Lack of Transportation (Non-Medical): No  Physical Activity: Inactive (06/13/2022)   Exercise Vital Sign    Days of Exercise per Week: 0 days    Minutes of Exercise per Session: 0 min  Stress: No Stress Concern Present (06/13/2022)   [employer] of Occupational Health - Occupational Stress Questionnaire    Feeling of Stress : Only a little  Social Connections: Unknown (06/13/2022)   Social Connection and Isolation Panel [NHANES]    Frequency of Communication with Friends and Family: Once a week    Frequency of Social Gatherings with Friends and Family: Once a week    Attends Religious Services: Not on Marketing executive or Organizations: Yes    Attends Banker Meetings: More than 4 times per year    Marital Status: Married  Catering manager Violence: Not At Risk (06/15/2022)   Humiliation, Afraid, Rape, and Kick questionnaire    Fear of Current or Ex-Partner: No    Emotionally Abused: No    Physically Abused: No    Sexually Abused: No   Functional Status Survey:    Allergies  Allergen Reactions   Nsaids Other (See Comments)    Stomach upset Told to avoid due to Eliquis   Aspirin Other (See Comments)    Stomach upset Told to avoid due to Eliquis   Codeine Nausea And Vomiting   Nitrostat [Nitroglycerin] Other (See Comments)    Bradycardia. Drop in heart rate      Pertinent  Health Maintenance Due  Topic Date Due   COLONOSCOPY (Pts 45-58yrs Insurance coverage will need to be confirmed)  03/30/2019   INFLUENZA VACCINE  11/30/2022   DEXA SCAN  Completed    Medications: Outpatient Encounter Medications as of 08/24/2022  Medication Sig   albuterol (VENTOLIN HFA) 108 (90 Base) MCG/ACT inhaler Inhale 1-2 puffs into the lungs every 4 (four) hours as needed for wheezing or shortness of breath.   ascorbic acid (VITAMIN C) 500 MG tablet Take 1 tablet (500 mg total) by mouth daily.   Cholecalciferol (VITAMIN D3) 50 MCG (2000 UT) capsule Take 1 capsule (2,000 Units total) by mouth daily.  Golimumab 50 MG/0.5ML SOAJ Take 50 mg by mouth every 2 (two) months. Hold for the next 2 weeks per Orthopedic surgery recommendations.   Multiple Vitamins-Minerals (CENTRUM ADULT PO) Take 1 tablet by mouth daily. 1 Tablet Daily.   [DISCONTINUED] acetaminophen (TYLENOL) 325 MG tablet Take 2 tablets (650 mg total) by mouth 2 (two) times daily as needed (generalized pain).   [DISCONTINUED] apixaban (ELIQUIS) 2.5 MG TABS tablet Take 1 tablet (2.5 mg total) by mouth 2 (two) times daily.   [DISCONTINUED] atorvastatin (LIPITOR) 80 MG tablet Take 1 tablet (80 mg total) by mouth daily.   [DISCONTINUED] carvedilol (COREG) 3.125 MG tablet Take 1 tablet (3.125 mg total) by mouth 2 (two) times daily with a meal.   [DISCONTINUED] Cyanocobalamin 2500 MCG TABS Take 2,500 mcg by mouth daily.   [DISCONTINUED] docusate sodium (COLACE) 100 MG capsule Take 1-2 capsules (100-200 mg total) by mouth daily as needed for mild constipation.   [DISCONTINUED] gabapentin (NEURONTIN) 300 MG capsule Take 1 capsule (300 mg total) by mouth at bedtime.   [DISCONTINUED] leflunomide (ARAVA) 20 MG tablet Take 1 tablet (20 mg total) by mouth daily.   [DISCONTINUED] levothyroxine (SYNTHROID) 88 MCG tablet Take 1 tablet (88 mcg total) by mouth daily before breakfast.   [DISCONTINUED] pantoprazole (PROTONIX) 40 MG  tablet Take 1 tablet (40 mg total) by mouth 2 (two) times daily. Take twice daily x 6 weeks, then once daily thereafter.   [DISCONTINUED] polyethylene glycol (MIRALAX / GLYCOLAX) 17 g packet Take 17 g by mouth daily as needed.   [DISCONTINUED] triamcinolone (NASACORT) 55 MCG/ACT AERO nasal inhaler Place 1 spray into the nose daily.   acetaminophen (TYLENOL) 325 MG tablet Take 2 tablets (650 mg total) by mouth 2 (two) times daily as needed (generalized pain).   apixaban (ELIQUIS) 2.5 MG TABS tablet Take 1 tablet (2.5 mg total) by mouth 2 (two) times daily.   atorvastatin (LIPITOR) 80 MG tablet Take 1 tablet (80 mg total) by mouth daily.   carvedilol (COREG) 3.125 MG tablet Take 1 tablet (3.125 mg total) by mouth 2 (two) times daily with a meal.   Cyanocobalamin 2500 MCG TABS Take 2,500 mcg by mouth daily.   docusate sodium (COLACE) 100 MG capsule Take 1-2 capsules (100-200 mg total) by mouth daily as needed for mild constipation.   gabapentin (NEURONTIN) 300 MG capsule Take 1 capsule (300 mg total) by mouth at bedtime.   leflunomide (ARAVA) 20 MG tablet Take 1 tablet (20 mg total) by mouth daily.   levothyroxine (SYNTHROID) 88 MCG tablet Take 1 tablet (88 mcg total) by mouth daily before breakfast.   pantoprazole (PROTONIX) 40 MG tablet Take 1 tablet (40 mg total) by mouth daily.   polyethylene glycol (MIRALAX / GLYCOLAX) 17 g packet Take 17 g by mouth daily as needed.   triamcinolone (NASACORT) 55 MCG/ACT AERO nasal inhaler Place 1 spray into the nose daily.   No facility-administered encounter medications on file as of 08/24/2022.    Review of Systems  Constitutional:  Negative for appetite change, fatigue and fever.  HENT:  Negative for congestion and trouble swallowing.   Eyes:  Negative for visual disturbance.  Respiratory:  Positive for shortness of breath. Negative for cough and wheezing.        DOE  Cardiovascular:  Positive for leg swelling.  Gastrointestinal:  Negative for abdominal  pain and constipation.  Genitourinary:  Negative for difficulty urinating, dysuria and urgency.  Musculoskeletal:  Positive for arthralgias, back pain and gait problem.  Negative for joint swelling.  Skin:  Negative for color change.       Residual facial bruise.   Neurological:  Negative for tremors and headaches.  Psychiatric/Behavioral:  Negative for behavioral problems and sleep disturbance. The patient is not nervous/anxious.     Vitals:   08/24/22 1135  BP: 128/76  Pulse: 70  Resp: 18  Temp: 98.1 F (36.7 C)  SpO2: 92%  Weight: 148 lb 11.2 oz (67.4 kg)  Height: 5\' 3"  (1.6 m)   Body mass index is 26.34 kg/m. Physical Exam Constitutional:      Appearance: Normal appearance.     Comments: Exhausted easily.   HENT:     Head: Normocephalic and atraumatic.     Nose:     Comments: Facial bruises, discomfort bridge of nose palpated.     Mouth/Throat:     Mouth: Mucous membranes are moist.  Eyes:     Extraocular Movements: Extraocular movements intact.     Conjunctiva/sclera: Conjunctivae normal.     Pupils: Pupils are equal, round, and reactive to light.  Cardiovascular:     Rate and Rhythm: Normal rate and regular rhythm.     Heart sounds: No murmur heard. Pulmonary:     Effort: Pulmonary effort is normal.     Breath sounds: No rales.  Abdominal:     General: Bowel sounds are normal.     Palpations: Abdomen is soft.     Tenderness: There is no abdominal tenderness.  Genitourinary:    Comments: External hemorrhoids from previous examination.  Musculoskeletal:     Cervical back: Normal range of motion and neck supple.     Right lower leg: Edema present.     Left lower leg: Edema present.     Comments: Trace edema BLE L>R  Skin:    General: Skin is warm and dry.     Findings: Bruising present.     Comments: L hip surgical scar, residual facial bruise, no pain  Neurological:     General: No focal deficit present.     Mental Status: She is alert and oriented to  person, place, and time. Mental status is at baseline.     Motor: No weakness.     Gait: Gait abnormal.  Psychiatric:        Mood and Affect: Mood normal.        Behavior: Behavior normal.        Thought Content: Thought content normal.     Labs reviewed: Basic Metabolic Panel: Recent Labs    07/22/22 0247 07/23/22 0645 07/24/22 1009 07/25/22 0533 08/01/22 0800 08/02/22 0918 08/02/22 0927  NA 136 137 138 139 141 142 141  K 3.7 3.8 4.3 4.0 4.2 3.8 3.8  CL 105 103 103 104 105 104 107  CO2 21* 22 21* 21* 25* 23  --   GLUCOSE 121* 107* 132* 91  --  110* 104*  BUN 63* 63* 65* 62* 57* 56* 56*  CREATININE 3.89* 3.95* 3.84* 3.71* 3.3* 3.35* 3.60*  CALCIUM 8.8* 9.1 9.3 9.6 10.0 10.0  --   MG 1.3* 1.8  --  1.9  --   --   --   PHOS 4.4 4.8* 4.9* 5.4*  --   --   --    Liver Function Tests: Recent Labs    07/04/22 0709 07/14/22 0747 07/19/22 0237 07/25/22 0533 08/01/22 0800 08/02/22 0918  AST 25 13*  --   --  14 18  ALT 17 5  --   --  10 13  ALKPHOS 93 88  --   --  78 71  BILITOT 0.7 0.9  --   --   --  0.9  PROT 5.4* 6.3*  --   --   --  6.8  ALBUMIN 2.2* 3.4*   < > 2.9* 3.7 3.3*   < > = values in this interval not displayed.   No results for input(s): "LIPASE", "AMYLASE" in the last 8760 hours. No results for input(s): "AMMONIA" in the last 8760 hours. CBC: Recent Labs    07/19/22 0237 07/19/22 2134 07/20/22 0423 07/22/22 0247 07/23/22 0327 07/25/22 0533 08/01/22 0800 08/02/22 0918 08/02/22 0927  WBC 10.8*  --  11.1*   < > 8.3 8.1 8.7 10.0  --   NEUTROABS 6.0  --  6.7  --   --   --  4,750.00  --   --   HGB 7.0*   < > 7.6*   < > 7.9* 8.1* 8.2* 8.3* 9.2*  HCT 20.5*   < > 22.4*   < > 24.1* 24.8* 25* 25.9* 27.0*  MCV 92.8  --  92.2   < > 93.1 94.7  --  97.4  --   PLT 285  --  272   < > 220 321 420* 383  --    < > = values in this interval not displayed.   Cardiac Enzymes: No results for input(s): "CKTOTAL", "CKMB", "CKMBINDEX", "TROPONINI" in the last 8760  hours. BNP: Invalid input(s): "POCBNP" CBG: Recent Labs    03/08/22 1108 07/03/22 0632  GLUCAP 113* 136*    Procedures and Imaging Studies During Stay: DG Knee AP/LAT W/Sunrise Right  Result Date: 08/02/2022 CLINICAL DATA:  Anterior knee pain after falling. On blood thinners. Bruising and swelling. EXAM: RIGHT KNEE 3 VIEWS COMPARISON:  Radiographs 02/15/2020 FINDINGS: The bones appear mildly demineralized. There is no evidence of acute fracture or dislocation. Tricompartmental degenerative changes are again noted, most advanced in the lateral compartment. There is meniscal chondrocalcinosis. There is a probable small nonspecific knee joint effusion without evidence of lipohemarthrosis. The soft tissues appear edematous. No evidence of foreign body or soft tissue emphysema. IMPRESSION: No evidence of acute fracture or dislocation. Tricompartmental degenerative changes with probable small nonspecific knee joint effusion. Electronically Signed   By: Carey Bullocks M.D.   On: 08/02/2022 11:17   DG Hand Complete Right  Result Date: 08/02/2022 CLINICAL DATA:  Fall.  Ring and small finger bruising. EXAM: RIGHT HAND - COMPLETE 3+ VIEW COMPARISON:  None Available. FINDINGS: There is no evidence of fracture or dislocation. There is no evidence of arthropathy or other focal bone abnormality. Soft tissues are unremarkable. Chondrocalcinosis of the TFCC. IMPRESSION: 1. No acute osseous abnormality. Electronically Signed   By: Obie Dredge M.D.   On: 08/02/2022 10:18   CT CERVICAL SPINE WO CONTRAST  Result Date: 08/02/2022 CLINICAL DATA:  82 year old female status post fall from standing. Struck head on concrete floor. On Eliquis. EXAM: CT CERVICAL SPINE WITHOUT CONTRAST TECHNIQUE: Multidetector CT imaging of the cervical spine was performed without intravenous contrast. Multiplanar CT image reconstructions were also generated. RADIATION DOSE REDUCTION: This exam was performed according to the departmental  dose-optimization program which includes automated exposure control, adjustment of the mA and/or kV according to patient size and/or use of iterative reconstruction technique. COMPARISON:  CT head and face today.  Cervical spine CT 02/15/2020. FINDINGS: Alignment: Stable compared to 2021. Cervicothoracic junction alignment is within normal limits. Bilateral posterior element alignment is  within normal limits. Skull base and vertebrae: Visualized skull base is intact. No atlanto-occipital dissociation. C1 and C2 appear intact and aligned. Chronic postoperative details are below. No acute osseous abnormality identified. Soft tissues and spinal canal: No prevertebral fluid or swelling. No visible canal hematoma. Negative noncontrast visible neck soft tissues aside from calcified left carotid atherosclerosis. Disc levels: Chronic C3-C4 and C4-C5 ACDF with solid arthrodesis. Bulky chronic C5-C6 disc and endplate degeneration. Advanced chronic C6-C7 and C7-T1 disc degeneration. Mild if any associated cervical spinal stenosis. Upper chest: Grossly intact visible upper thoracic levels. Superimposed respiratory motion artifact. Partially visible apical lung scarring with some superimposed pulmonary septal thickening. Staple line in the left lung apex might be new since 2021, uncertain. Prior sternotomy. IMPRESSION: 1. No acute traumatic injury identified in the cervical spine. 2. Chronic C3-C4 and C4-C5 ACDF with solid arthrodesis. 3. Motion artifact in the upper chest. Electronically Signed   By: Odessa Fleming M.D.   On: 08/02/2022 09:58   CT MAXILLOFACIAL WO CONTRAST  Result Date: 08/02/2022 CLINICAL DATA:  82 year old female status post fall from standing. Struck head on concrete floor. On Eliquis. EXAM: CT MAXILLOFACIAL WITHOUT CONTRAST TECHNIQUE: Multidetector CT imaging of the maxillofacial structures was performed. Multiplanar CT image reconstructions were also generated. RADIATION DOSE REDUCTION: This exam was performed  according to the departmental dose-optimization program which includes automated exposure control, adjustment of the mA and/or kV according to patient size and/or use of iterative reconstruction technique. COMPARISON:  Head and cervical spine CT today. Previous head CT 07/02/2022. FINDINGS: Osseous: Mandible intact and normally located. No acute dental finding. Mildly comminuted minimally displaced bilateral superior nasal bone fractures (series 8, image 43) with regional soft tissue swelling. Mild associated buckling of the bony nasal septum on series 5, image 58 is new from last month. But bilateral maxilla, zygoma, and pterygoid bones appear intact. Central skull base appears intact. Cervical spine is detailed separately. Orbits: Intact orbital walls. Bilateral orbit medial canthus, preseptal soft tissue swelling and stranding in conjunction with nasal bridge and forehead soft tissue injury. Postoperative changes to both globes. Bilateral intraorbital soft tissues remain normal. Sinuses: Retained secretions in the nasal cavity associated with anterior nasal septal injury. No discrete nasal septal hematoma is evident. Paranasal sinuses, tympanic cavities and mastoids remain well aerated. Soft tissues: Retained secretions in the nasopharynx. Negative visible other noncontrast deep soft tissue spaces of the face except for bulky left cervical carotid calcified atherosclerosis. Limited intracranial: Stable to that reported separately. IMPRESSION: 1. Mildly comminuted and minimally displaced fractures of the superior nasal bones, as well as the bony nasal septum. Regional soft tissue swelling. Retained secretions in the nasal cavity and nasopharynx. 2. No other facial fracture identified. Electronically Signed   By: Odessa Fleming M.D.   On: 08/02/2022 09:54   CT HEAD WO CONTRAST  Result Date: 08/02/2022 CLINICAL DATA:  82 year old female status post fall from standing. Struck head on concrete floor. On Eliquis. EXAM: CT  HEAD WITHOUT CONTRAST TECHNIQUE: Contiguous axial images were obtained from the base of the skull through the vertex without intravenous contrast. RADIATION DOSE REDUCTION: This exam was performed according to the departmental dose-optimization program which includes automated exposure control, adjustment of the mA and/or kV according to patient size and/or use of iterative reconstruction technique. COMPARISON:  Face and cervical spine CT today. Prior head CT 07/02/2022. FINDINGS: Brain: Cerebral volume is stable and within normal limits for age. No midline shift, ventriculomegaly, mass effect, evidence of mass lesion, intracranial hemorrhage  or evidence of cortically based acute infarction. Gray-white differentiation is stable and within normal limits for age. Vascular: Calcified atherosclerosis at the skull base. No suspicious intracranial vascular hyperdensity. Skull: No skull fracture identified. Facial bones are detailed separately. Sinuses/Orbits: Visualized paranasal sinuses and mastoids are stable and well aerated. Other: Broad-based up to 13 mm thick anterior forehead scalp hematoma. Underlying frontal bones and frontal sinuses appear intact. See also face CT reported separately. IMPRESSION: 1. Broad-based forehead scalp hematoma. No skull fracture identified, Face CT reported separately. 2. Stable and normal for age non contrast CT appearance of the brain. Electronically Signed   By: Odessa Fleming M.D.   On: 08/02/2022 09:49    Assessment/Plan:   There are no diagnoses linked to this encounter.   Patient is being discharged with the following home health services:    Patient is being discharged with the following durable medical equipment:    Patient has been advised to f/u with their PCP in 1-2 weeks to for a transitions of care visit.  Social services at their facility was responsible for arranging this appointment.  Pt was provided with adequate prescriptions of noncontrolled medications to reach  the scheduled appointment .  For controlled substances, a limited supply was provided as appropriate for the individual patient.  If the pt normally receives these medications from a pain clinic or has a contract with another physician, these medications should be received from that clinic or physician only).    Future labs/tests needed: prn

## 2022-08-24 NOTE — Assessment & Plan Note (Signed)
takes Atorvastatin, LDL 74 09/07/20 

## 2022-08-24 NOTE — Assessment & Plan Note (Signed)
08/02/22 sustained from a mechanical fall, pending ENT(Mildly comminuted and minimally displaced fractures of the superior nasal bones, as well as the bony nasal septum)

## 2022-08-24 NOTE — Assessment & Plan Note (Signed)
takes Levothyroxine, TSH 0.65 08/01/22 

## 2022-08-24 NOTE — Assessment & Plan Note (Signed)
takes Coreg, off Amlodipine by nephrology.

## 2022-08-24 NOTE — Assessment & Plan Note (Signed)
pending sleep study

## 2022-08-24 NOTE — Assessment & Plan Note (Signed)
EGD3/9/24 10 gastric ulcers (negative H pylori/malignancy), placed PPI bid x 6 wks, then daily.  

## 2022-08-24 NOTE — Assessment & Plan Note (Signed)
BLE, resumed Furosemide at [facility], Kcl per nephrology.

## 2022-08-24 NOTE — Assessment & Plan Note (Signed)
takes Vit D 

## 2022-08-25 ENCOUNTER — Encounter: Payer: Self-pay | Admitting: Nurse Practitioner

## 2022-08-28 DIAGNOSIS — R2681 Unsteadiness on feet: Secondary | ICD-10-CM | POA: Diagnosis not present

## 2022-08-28 DIAGNOSIS — R278 Other lack of coordination: Secondary | ICD-10-CM | POA: Diagnosis not present

## 2022-08-28 DIAGNOSIS — Z9181 History of falling: Secondary | ICD-10-CM | POA: Diagnosis not present

## 2022-08-28 DIAGNOSIS — M6281 Muscle weakness (generalized): Secondary | ICD-10-CM | POA: Diagnosis not present

## 2022-08-29 DIAGNOSIS — N179 Acute kidney failure, unspecified: Secondary | ICD-10-CM | POA: Diagnosis not present

## 2022-08-30 ENCOUNTER — Ambulatory Visit (HOSPITAL_COMMUNITY)
Admission: RE | Admit: 2022-08-30 | Discharge: 2022-08-30 | Disposition: A | Discharge status: Home / Self Care | Payer: Medicare PPO | Source: Ambulatory Visit | Attending: Nephrology | Admitting: Nephrology

## 2022-08-30 VITALS — BP 176/63 | HR 80 | Temp 98.0°F

## 2022-08-30 DIAGNOSIS — N184 Chronic kidney disease, stage 4 (severe): Secondary | ICD-10-CM

## 2022-08-30 LAB — IRON AND TIBC
Iron: 84 ug/dL (ref 28–170)
Saturation Ratios: 29 % (ref 10.4–31.8)
TIBC: 287 ug/dL (ref 250–450)
UIBC: 203 ug/dL

## 2022-08-30 LAB — FERRITIN: Ferritin: 200 ng/mL (ref 11–307)

## 2022-08-30 LAB — POCT HEMOGLOBIN-HEMACUE: Hemoglobin: 7.8 g/dL — ABNORMAL LOW (ref 12.0–15.0)

## 2022-08-30 MED ORDER — EPOETIN ALFA-EPBX 10000 UNIT/ML IJ SOLN
INTRAMUSCULAR | Status: AC
  Administered 2022-08-30: 10000 [IU] via SUBCUTANEOUS
  Filled 2022-08-30: qty 1

## 2022-08-30 MED ORDER — EPOETIN ALFA-EPBX 10000 UNIT/ML IJ SOLN: 10000.0000 [IU] | INTRAMUSCULAR | Status: DC

## 2022-08-31 DIAGNOSIS — R498 Other voice and resonance disorders: Secondary | ICD-10-CM | POA: Diagnosis not present

## 2022-08-31 DIAGNOSIS — R278 Other lack of coordination: Secondary | ICD-10-CM | POA: Diagnosis not present

## 2022-08-31 DIAGNOSIS — R0602 Shortness of breath: Secondary | ICD-10-CM | POA: Diagnosis not present

## 2022-08-31 DIAGNOSIS — M6281 Muscle weakness (generalized): Secondary | ICD-10-CM | POA: Diagnosis not present

## 2022-08-31 DIAGNOSIS — R2681 Unsteadiness on feet: Secondary | ICD-10-CM | POA: Diagnosis not present

## 2022-08-31 DIAGNOSIS — J449 Chronic obstructive pulmonary disease, unspecified: Secondary | ICD-10-CM | POA: Diagnosis not present

## 2022-08-31 DIAGNOSIS — R41841 Cognitive communication deficit: Secondary | ICD-10-CM | POA: Diagnosis not present

## 2022-09-01 DIAGNOSIS — Z9181 History of falling: Secondary | ICD-10-CM | POA: Diagnosis not present

## 2022-09-01 DIAGNOSIS — M6281 Muscle weakness (generalized): Secondary | ICD-10-CM | POA: Diagnosis not present

## 2022-09-01 DIAGNOSIS — R278 Other lack of coordination: Secondary | ICD-10-CM | POA: Diagnosis not present

## 2022-09-04 DIAGNOSIS — R41841 Cognitive communication deficit: Secondary | ICD-10-CM | POA: Diagnosis not present

## 2022-09-04 DIAGNOSIS — R278 Other lack of coordination: Secondary | ICD-10-CM | POA: Diagnosis not present

## 2022-09-04 DIAGNOSIS — R0602 Shortness of breath: Secondary | ICD-10-CM | POA: Diagnosis not present

## 2022-09-04 DIAGNOSIS — J449 Chronic obstructive pulmonary disease, unspecified: Secondary | ICD-10-CM | POA: Diagnosis not present

## 2022-09-04 DIAGNOSIS — R2681 Unsteadiness on feet: Secondary | ICD-10-CM | POA: Diagnosis not present

## 2022-09-04 DIAGNOSIS — M6281 Muscle weakness (generalized): Secondary | ICD-10-CM | POA: Diagnosis not present

## 2022-09-04 DIAGNOSIS — R498 Other voice and resonance disorders: Secondary | ICD-10-CM | POA: Diagnosis not present

## 2022-09-05 ENCOUNTER — Ambulatory Visit: Payer: Medicare PPO | Admitting: Internal Medicine

## 2022-09-05 ENCOUNTER — Telehealth: Payer: Self-pay | Admitting: *Deleted

## 2022-09-05 VITALS — BP 138/78 | HR 77 | Temp 98.1°F | Ht 63.0 in | Wt 146.0 lb

## 2022-09-05 DIAGNOSIS — N184 Chronic kidney disease, stage 4 (severe): Secondary | ICD-10-CM | POA: Diagnosis not present

## 2022-09-05 DIAGNOSIS — E559 Vitamin D deficiency, unspecified: Secondary | ICD-10-CM

## 2022-09-05 DIAGNOSIS — M069 Rheumatoid arthritis, unspecified: Secondary | ICD-10-CM | POA: Diagnosis not present

## 2022-09-05 DIAGNOSIS — E871 Hypo-osmolality and hyponatremia: Secondary | ICD-10-CM | POA: Diagnosis not present

## 2022-09-05 DIAGNOSIS — R269 Unspecified abnormalities of gait and mobility: Secondary | ICD-10-CM

## 2022-09-05 DIAGNOSIS — I10 Essential (primary) hypertension: Secondary | ICD-10-CM | POA: Diagnosis not present

## 2022-09-05 DIAGNOSIS — M79672 Pain in left foot: Secondary | ICD-10-CM | POA: Diagnosis not present

## 2022-09-05 DIAGNOSIS — J432 Centrilobular emphysema: Secondary | ICD-10-CM | POA: Diagnosis not present

## 2022-09-05 DIAGNOSIS — M79671 Pain in right foot: Secondary | ICD-10-CM | POA: Diagnosis not present

## 2022-09-05 DIAGNOSIS — B351 Tinea unguium: Secondary | ICD-10-CM | POA: Diagnosis not present

## 2022-09-05 DIAGNOSIS — L84 Corns and callosities: Secondary | ICD-10-CM | POA: Diagnosis not present

## 2022-09-05 LAB — TSH: TSH: 2.94 u[IU]/mL (ref 0.35–5.50)

## 2022-09-05 LAB — COMPREHENSIVE METABOLIC PANEL
ALT: 25 U/L (ref 0–35)
AST: 38 U/L — ABNORMAL HIGH (ref 0–37)
Albumin: 4 g/dL (ref 3.5–5.2)
Alkaline Phosphatase: 79 U/L (ref 39–117)
BUN: 60 mg/dL — ABNORMAL HIGH (ref 6–23)
CO2: 32 mEq/L (ref 19–32)
Calcium: 10.2 mg/dL (ref 8.4–10.5)
Chloride: 94 mEq/L — ABNORMAL LOW (ref 96–112)
Creatinine, Ser: 3.46 mg/dL — ABNORMAL HIGH (ref 0.40–1.20)
GFR: 11.69 mL/min — CL (ref >60.00)
Glucose, Bld: 74 mg/dL (ref 70–99)
Potassium: 4.4 mEq/L (ref 3.5–5.1)
Sodium: 138 mEq/L (ref 135–145)
Total Bilirubin: 0.6 mg/dL (ref 0.2–1.2)
Total Protein: 7.2 g/dL (ref 6.0–8.3)

## 2022-09-05 LAB — VITAMIN B12: Vitamin B-12: >1500 pg/mL — ABNORMAL HIGH (ref 211–911)

## 2022-09-05 LAB — T4, FREE: Free T4: 0.99 ng/dL (ref 0.60–1.60)

## 2022-09-05 MED ORDER — CLOTRIMAZOLE-BETAMETHASONE 1-0.05 % EX CREA: 1.0000 | TOPICAL_CREAM | Freq: Two times a day (BID) | CUTANEOUS | 3 refills | Status: DC

## 2022-09-05 NOTE — Assessment & Plan Note (Signed)
Monitor GFR 

## 2022-09-05 NOTE — Assessment & Plan Note (Signed)
  BP Readings from Last 3 Encounters:  09/05/22 138/78  08/30/22 (!) 176/63  08/24/22 128/76

## 2022-09-05 NOTE — Telephone Encounter (Signed)
Gfr - 11.69

## 2022-09-05 NOTE — Progress Notes (Signed)
Subjective:  Patient ID: [name], female    DOB: 1940/08/15  Age: 82 y.o. MRN: [id number]  CC: No chief complaint on file.   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 180 tablet 1   ascorbic acid (VITAMIN C) 500 MG tablet Take 1 tablet (500 mg total) by mouth daily. 30 tablet 2   atorvastatin (LIPITOR) 80 MG tablet Take 1 tablet (80 mg total) by mouth daily. 90 tablet 1   carvedilol (COREG) 3.125 MG tablet Take 1 tablet (3.125 mg total) by mouth 2 (two) times daily with a meal. 180 tablet 1   Cholecalciferol (VITAMIN D3) 50 MCG (2000 UT) capsule Take 1 capsule (2,000 Units total) by mouth daily. 100 capsule 3   Cyanocobalamin 2500 MCG TABS Take 2,500 mcg by mouth daily. 30 tablet 3   docusate sodium (COLACE) 100 MG capsule Take 1-2 capsules (100-200 mg total) by mouth daily as needed for mild constipation. 100 capsule 1   furosemide (LASIX) 40 MG tablet Take 40 mg by mouth daily.     gabapentin (NEURONTIN) 300 MG capsule Take 1 capsule (300 mg total) by mouth at bedtime. 30 capsule 1   leflunomide (ARAVA) 20 MG tablet Take 1 tablet (20 mg total) by mouth daily. 30 tablet 1   levothyroxine (SYNTHROID) 88 MCG tablet Take 1 tablet (88 mcg total) by mouth daily before breakfast. 90 tablet 1   Multiple Vitamins-Minerals (CENTRUM ADULT PO) Take 1 tablet by mouth daily. 1 Tablet Daily.     pantoprazole (PROTONIX) 40 MG tablet Take 1 tablet (40 mg total) by mouth daily. 180 tablet 1   polyethylene glycol (MIRALAX / GLYCOLAX) 17 g packet Take 17 g by mouth daily as needed. 14 each 1   triamcinolone (NASACORT) 55 MCG/ACT AERO nasal inhaler Place 1 spray into the nose daily. 1 each  3   acetaminophen (TYLENOL) 325 MG tablet Take 2 tablets (650 mg total) by mouth 2 (two) times daily as needed (generalized pain). (Patient not taking: Reported on 09/05/2022) 100 tablet 2   albuterol (VENTOLIN HFA) 108 (90 Base) MCG/ACT inhaler Inhale 1-2 puffs into the lungs every 4 (four) hours as needed for wheezing or shortness of breath. (Patient not taking: Reported on 09/05/2022) 1 each 2   Golimumab 50 MG/0.5ML SOAJ Take 50 mg by mouth every 2 (two) months. Hold for the next 2 weeks per Orthopedic surgery recommendations. (Patient not taking: Reported on 09/05/2022) 0.3 mL    No facility-administered medications prior to visit.  "  ROS: Review of Systems  Constitutional:  Positive for fatigue. Negative for activity change, appetite change, chills and unexpected weight change.  HENT:  Negative for congestion, mouth sores and sinus pressure.   Eyes:  Negative for visual disturbance.  Respiratory:  Negative for cough and chest tightness.   Gastrointestinal:  Negative for abdominal pain and nausea.  Genitourinary:  Negative for difficulty urinating, frequency and vaginal pain.  Musculoskeletal:  Positive for gait problem. Negative for back pain.  Skin:  Negative for pallor and rash.  Neurological:  Positive for weakness. Negative for dizziness, tremors, numbness and headaches.  Psychiatric/Behavioral:  Positive for decreased concentration and sleep disturbance. Negative for confusion and suicidal ideas.     Objective:  BP 138/78 (BP Location: Left Arm, Patient Position: Sitting, Cuff Size: Normal)   Pulse 77   Temp 98.1 F (36.7 C) (Oral)   Ht 5\' 3"  (1.6 m)   Wt 146 lb (66.2 kg)   SpO2 92%   BMI 25.86 kg/m   BP Readings from Last 3 Encounters:  09/05/22 138/78  08/30/22 (!) 176/63  08/24/22 128/76    Wt Readings from Last 3 Encounters:  09/05/22 146 lb (66.2 kg)  08/24/22 148 lb 11.2 oz (67.4 kg)  08/07/22 157 lb 3 oz (71.3 kg)    Physical Exam Constitutional:       General: She is not in acute distress.    Appearance: She is well-developed. She is obese. She is not ill-appearing.  HENT:     Head: Normocephalic.     Right Ear: External ear normal.     Left Ear: External ear normal.     Nose: Nose normal.  Eyes:     General:        Right eye: No discharge.        Left eye: No discharge.     Conjunctiva/sclera: Conjunctivae normal.     Pupils: Pupils are equal, round, and reactive to light.  Neck:     Thyroid: No thyromegaly.     Vascular: No JVD.     Trachea: No tracheal deviation.  Cardiovascular:     Rate and Rhythm: Normal rate and regular rhythm.     Heart sounds: Normal heart sounds.  Pulmonary:     Effort: No respiratory distress.     Breath sounds: No stridor. No wheezing.  Abdominal:     General: Bowel sounds are normal. There is no distension.     Palpations: Abdomen is soft. There is no mass.     Tenderness: There is no abdominal tenderness. There is no guarding or rebound.  Musculoskeletal:        General: No tenderness.     Cervical back: Normal range of motion and neck supple. No rigidity.     Right lower leg: No edema.     Left lower leg: No edema.  Lymphadenopathy:     Cervical: No cervical adenopathy.  Skin:    Findings: No erythema or rash.  Neurological:     Mental Status: She is oriented to person, place, and time.     Cranial Nerves: No cranial nerve deficit.     Motor: No abnormal muscle tone.     Coordination: Coordination normal.     Deep Tendon Reflexes: Reflexes normal.  Psychiatric:        Behavior: Behavior normal.        Thought Content: Thought content normal.        Judgment: Judgment normal.  Using a walker  Lab Results  Component Value Date   WBC 10.0 08/02/2022   HGB 7.8 (L) 08/30/2022   HCT 27.0 (L) 08/02/2022   PLT 383 08/02/2022   GLUCOSE 104 (H) 08/02/2022   CHOL 157 09/07/2020   TRIG 121.0 09/07/2020   HDL 59.30 09/07/2020   LDLDIRECT 162.1 10/28/2009   LDLCALC 74 09/07/2020   ALT  13 08/02/2022   AST 18 08/02/2022   NA 141 08/02/2022   K 3.8 08/02/2022   CL 107 08/02/2022   CREATININE 3.60 (H) 08/02/2022   BUN 56 (H) 08/02/2022   CO2 23 08/02/2022   TSH  0.65 08/01/2022   INR 1.2 08/02/2022    No results found.  Assessment & Plan:   Problem List Items Addressed This Visit     Essential hypertension (Chronic)     BP Readings from Last 3 Encounters:  09/05/22 138/78  08/30/22 (!) 176/63  08/24/22 128/76        Relevant Medications   furosemide (LASIX) 40 MG tablet   Other Relevant Orders   T4, free   TSH   Vitamin B12   Urinalysis   Rheumatoid arthritis (HCC) (Chronic)    Off Prednisone Off Arava temporarily      COPD (chronic obstructive pulmonary disease) (HCC)    On O2      Gait disorder    Multifactorial Use a walker      Relevant Orders   TSH   CKD (chronic kidney disease) stage 4, GFR 15-29 ml/min (HCC) - baseline SCr 2.5. follows with Dr. Malen Gauze with [facility]    Monitor GFR      Relevant Orders   Urinalysis   Comprehensive metabolic panel   Vitamin D deficiency    On Vit D      Hyponatremia    Check CMET         Meds ordered this encounter  Medications   clotrimazole-betamethasone (LOTRISONE) cream    Sig: Apply 1 Application topically 2 (two) times daily. Mouth corner rash    Dispense:  30 g    Refill:  3      Follow-up: Return in about 2 months (around 11/05/2022) for a follow-up visit.  Sonda Primes, MD

## 2022-09-05 NOTE — Assessment & Plan Note (Signed)
Check CMET. 

## 2022-09-05 NOTE — Telephone Encounter (Signed)
Noted. Thanks.

## 2022-09-05 NOTE — Assessment & Plan Note (Signed)
Off Prednisone Off Arava temporarily

## 2022-09-05 NOTE — Assessment & Plan Note (Signed)
On O2 

## 2022-09-05 NOTE — Assessment & Plan Note (Signed)
On Vit D 

## 2022-09-05 NOTE — Assessment & Plan Note (Addendum)
Multifactorial Use a walker

## 2022-09-06 DIAGNOSIS — Z9181 History of falling: Secondary | ICD-10-CM | POA: Diagnosis not present

## 2022-09-06 DIAGNOSIS — R2681 Unsteadiness on feet: Secondary | ICD-10-CM | POA: Diagnosis not present

## 2022-09-06 DIAGNOSIS — R41841 Cognitive communication deficit: Secondary | ICD-10-CM | POA: Diagnosis not present

## 2022-09-06 DIAGNOSIS — R0602 Shortness of breath: Secondary | ICD-10-CM | POA: Diagnosis not present

## 2022-09-06 DIAGNOSIS — M6281 Muscle weakness (generalized): Secondary | ICD-10-CM | POA: Diagnosis not present

## 2022-09-06 DIAGNOSIS — J449 Chronic obstructive pulmonary disease, unspecified: Secondary | ICD-10-CM | POA: Diagnosis not present

## 2022-09-06 DIAGNOSIS — R498 Other voice and resonance disorders: Secondary | ICD-10-CM | POA: Diagnosis not present

## 2022-09-06 DIAGNOSIS — R278 Other lack of coordination: Secondary | ICD-10-CM | POA: Diagnosis not present

## 2022-09-06 LAB — URINALYSIS, ROUTINE W REFLEX MICROSCOPIC
Bilirubin Urine: NEGATIVE
Ketones, ur: NEGATIVE
Leukocytes,Ua: NEGATIVE
Nitrite: NEGATIVE
Specific Gravity, Urine: <=1.005 — AB (ref 1.000–1.030)
Total Protein, Urine: 100 — AB
Urine Glucose: NEGATIVE
Urobilinogen, UA: 0.2 (ref 0.0–1.0)
WBC, UA: NONE SEEN (ref 0–?)
pH: 6 (ref 5.0–8.0)

## 2022-09-07 DIAGNOSIS — N184 Chronic kidney disease, stage 4 (severe): Secondary | ICD-10-CM | POA: Diagnosis not present

## 2022-09-07 DIAGNOSIS — M6281 Muscle weakness (generalized): Secondary | ICD-10-CM | POA: Diagnosis not present

## 2022-09-07 DIAGNOSIS — Z9181 History of falling: Secondary | ICD-10-CM | POA: Diagnosis not present

## 2022-09-07 DIAGNOSIS — R278 Other lack of coordination: Secondary | ICD-10-CM | POA: Diagnosis not present

## 2022-09-08 ENCOUNTER — Ambulatory Visit: Payer: Medicare PPO | Admitting: Gastroenterology

## 2022-09-08 ENCOUNTER — Encounter: Payer: Self-pay | Admitting: Gastroenterology

## 2022-09-08 VITALS — BP 130/68 | HR 87 | Ht 63.0 in | Wt 142.0 lb

## 2022-09-08 DIAGNOSIS — B351 Tinea unguium: Secondary | ICD-10-CM | POA: Diagnosis not present

## 2022-09-08 DIAGNOSIS — R194 Change in bowel habit: Secondary | ICD-10-CM

## 2022-09-08 DIAGNOSIS — D649 Anemia, unspecified: Secondary | ICD-10-CM | POA: Diagnosis not present

## 2022-09-08 DIAGNOSIS — M79671 Pain in right foot: Secondary | ICD-10-CM | POA: Diagnosis not present

## 2022-09-08 DIAGNOSIS — K269 Duodenal ulcer, unspecified as acute or chronic, without hemorrhage or perforation: Secondary | ICD-10-CM

## 2022-09-08 DIAGNOSIS — Z7901 Long term (current) use of anticoagulants: Secondary | ICD-10-CM

## 2022-09-08 DIAGNOSIS — M79672 Pain in left foot: Secondary | ICD-10-CM | POA: Diagnosis not present

## 2022-09-08 DIAGNOSIS — K250 Acute gastric ulcer with hemorrhage: Secondary | ICD-10-CM

## 2022-09-08 DIAGNOSIS — L84 Corns and callosities: Secondary | ICD-10-CM | POA: Diagnosis not present

## 2022-09-08 NOTE — Progress Notes (Signed)
HPI :  82 y.o. year old female with a past medical history of  AAA repair 23, CAD status post CABG in 1996, celiac artery stenosis, CKD IV, COPD, history of TIA/CVA , Hx of PE on Eliquis, RA, diverticulosis, diverticular hemorrhage, adenomatous colon polyps.      She is here to follow-up after hospitalization for we were consulted for her care.  Recall she was recovering on the ward in March from urosepsis E. coli, AKI on CKD.  She is on Eliquis for history of PE.  Had symptoms concerning for upper GI bleed with worsening anemia.  EGD performed March 9 showing numerous gastric and duodenal ulcers.  Biopsies negative for H. pylori.  She denied much NSAID use prior to finding this.  No focal ulcer with high risk stigmata for bleeding, she was treated with IV Protonix drip, Eliquis was held for a few days.  She had no further bleeding symptoms and resolved.  She has been feeling okay since that time.  No recurrent bleeding symptoms.  She remains on Eliquis for history of PE last October.  She maintains Protonix 40 mg once daily.  Denies any abdominal pains.  Eating well.  She does have some fatigue.  Her hemoglobin has been in the sevens to 8 range, with normal iron studies.  She has had worsening of her kidney disease, followed by nephrology.  She does not use any NSAIDs.  Using Tylenol as needed.  She states she has a Retacrit infusion scheduled by her nephrologist.  She does have sense of incomplete evacuation of her bowels.  Using both stool softener and Metamucil and stool she is they are too soft.  Has a few balance per day.  Not using MiraLAX.  No blood in her stool.  Last colonoscopy as outlined.  She is on supplemental oxygen, she had an echo in October showing grade 2 diastolic dysfunction with normal EF.  We discussed how aggressive she wanted to be with surveillance endoscopy.  Recall she had a colonoscopy in 2017 showing a sessile serrated polyp invading her appendiceal orifice and had  surgical resection of that.  She has not wanted surveillance colonoscopy since that time.  Previous GI History / Evaluation :  Most recent colonoscopy in 2017 - A 5 mm polyp was found in the cecum. The polyp was sessile. The polyp was removed with a cold snare.  - A medium polyp was found in the appendiceal orifice, the borders could not be seen and appeared to be invading the appendix. The polyp was sessile. Biopsies were taken with a cold forceps for histology. - A 8 mm polyp was found in the ascending colon. The polyp was sessile. The polyp was removed with a cold snare.  - A 5 mm polyp was found in the descending colon. The polyp was sessile. The polyp was removed with a cold snare. Resection and retrieval were complete. - A 4 mm polyp was found in the sigmoid colon. The polyp was sessile. The polyp was removed with a cold snare. - The terminal ileum appeared normal. - Multiple medium-mouthed diverticula were found in the left colon. - Internal hemorrhoids were found during retroflexion. - The exam was otherwise without abnormality. No inflammatory changes were noted. - Biopsies for histology were taken with a cold forceps from the right colon and left colon for evaluation of microscopic colitis   Surgical [P], descending, ascending, cecum and sigmoid, polyp (4) - TUBULAR ADENOMA. - SESSILE SERRATED ADENOMA(S) WITHOUT CYTOLOGIC DYSPLASIA (  MULTIPLE FRAGMENTS). - HYPERPLASTIC POLYP. - HIGH GRADE DYSPLASIA IS NOT IDENTIFIED. 2. Surgical [P], apendiceal orifice, biopsy - SESSILE SERRATED POLYP WITHOUT CYTOLOGIC DYSPLASIA. 3. Surgical [P], random site, right and left colon, biopsy - BENIGN COLONIC MUCOSA. - NO SIGNIFICANT INFLAMMATION OR OTHER ABNORMALITIES IDENTIFIED    EGD 07/08/22: - Esophagogastric landmarks identified. - 4 cm hiatal hernia. - LA Grade A esophagitis. - Normal esophagus otherwise. - 10 gastric ulcers as noted above - none with stigmata for bleeding - Mucosal changes in the  stomach. Biopsied. - Non-bleeding duodenal ulcer with pigmented material. Ulcers are the likely cause for her dark stools / anemia in the setting of Eliquis. No high risk stigmata for bleeding noted, she has stopped with holding Eliquis and PPI.  FINAL MICROSCOPIC DIAGNOSIS:   A. STOMACH, BIOPSY:       Gastric antral / oxyntic mucosa without significant diagnostic  alteration.      No H. pylori identified on HE.       Negative for intestinal metaplasia or dysplasia.    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 Most likely scar. Mild subendocardial ischemia. EF 71% LOW RISK.    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 Kellie Simmering   Pulmonary emboli [facility]) 02/06/2022   01/2022 PE/DVT post-op. Lower GI bleed     Treat edema  Start Eliquis at the reduced dose due to Creat>1.5 and age >42   Rheumatoid arthritis (HCC) 2011    Dr.Truslow; fracture knees, hands and wrists -    S/P CABG x 1 1996   CABG--LIMA-LAD for ostial LAD (not felt to be PCI amenable). EF NORMAL then; LIMA now atretic   Shortness of breath dyspnea    with exertion   Stroke (HCC) 11/2014   TIA    Urinary frequency      Past Surgical History:  Procedure Laterality Date   ABDOMINAL AORTIC ANEURYSM REPAIR  1996   Complicated by mesenteric artery stenosis and splenic artery infarction with acquired Asplenia   APPENDECTOMY     BIOPSY  07/08/2022   Procedure: BIOPSY;  Surgeon: Benancio Deeds, MD;  Location: [facility] ENDOSCOPY;  Service: Gastroenterology;;   [name]  07/2011   right foot  CARDIAC CATHETERIZATION  2005   (Most recent CATH) - ostial LAD lesion 20-30% (down from 90% initially). Atretic LIMA. Minimal disease the RCA and Circumflex system.   CARPAL TUNNEL RELEASE Left    CATARACT EXTRACTION Bilateral    CERVICAL SPINE SURGERY     plate 1610 Dr. Wynetta Emery   COLONOSCOPY     CORONARY ARTERY BYPASS GRAFT  1996   INCLUDED AN INTERNAL MAMMARY ARTERY TO THE LAD. EF WAS NORMAL   ESOPHAGOGASTRODUODENOSCOPY (EGD) WITH PROPOFOL N/A 07/08/2022   Procedure: ESOPHAGOGASTRODUODENOSCOPY (EGD) WITH PROPOFOL;  Surgeon: Benancio Deeds, MD;  Location: [facility] ENDOSCOPY;  Service: Gastroenterology;  Laterality: N/A;   INGUINAL HERNIA REPAIR Right    LAPAROSCOPIC APPENDECTOMY N/A 06/28/2016   Procedure: APPENDECTOMY LAPAROSCOPIC;  Surgeon: [name], MD;  Location: [facility] ORS;  Service: General;  Laterality: N/A;   NM MYOVIEW LTD  06/2010   Fixed anteroseptal, apical and inferoapical defect with moderate size. Most likely scar. Mild subendocardial ischemia. EF 71% LOW RISK.    SPLENECTOMY     TOTAL HIP ARTHROPLASTY Left 01/23/2022   Procedure: LEFT TOTAL HIP ARTHROPLASTY ANTERIOR APPROACH;  Surgeon: [name], MD;  Location: [facility] OR;  Service: Orthopedics;  Laterality: Left;  3-C   TRANSTHORACIC ECHOCARDIOGRAM  12/2014   [facility]:  Normal LV size & function. EF 55-60%,    vagina polyp     VIDEO ASSISTED THORACOSCOPY (VATS)/WEDGE RESECTION Left 03/02/2015   Procedure: VIDEO ASSISTED THORACOSCOPY (VATS), MINI THORACOTOMY, LEFT UPPER LOBE WEDGE, TAKE DOWN OF INTERNAL MAMMARY LESIONS, PLACEMENT OF ON-Q PUMP;  Surgeon: [name], MD;  Location: [facility] OR;  Service: Thoracic;  Laterality: Left;   VIDEO BRONCHOSCOPY N/A 03/02/2015   Procedure: BRONCHOSCOPY;  Surgeon: [name], MD;  Location: [facility] OR;  Service: Thoracic;  Laterality: N/A;   VIDEO BRONCHOSCOPY WITH ENDOBRONCHIAL NAVIGATION N/A 10/08/2017   Procedure: VIDEO BRONCHOSCOPY WITH ENDOBRONCHIAL NAVIGATION WITH BIOPSIES OF LEFT UPPER LOBE AND LEFT LOWER LOBE;  Surgeon: [name], MD;  Location: [facility] OR;  Service: Thoracic;  Laterality: N/A;   Family History  Problem Relation Age of Onset   Hypertension Mother    Diabetes Mother    Heart disease Mother    Hyperlipidemia Mother    Stroke Father    Hyperlipidemia Sister    Hypertension Sister    Hypertension Daughter    Cancer Paternal Uncle        Deceased from cancer not sure of site   Hypertension Son    Hyperlipidemia Son    Hyperlipidemia Son    Hypertension Son    Hyperlipidemia Son    Hypertension Son    Hypertension Other    Coronary artery disease Other    Asthma Neg Hx    Colon cancer Neg Hx    Social History   Tobacco Use   Smoking status: Former    Types: Cigarettes    Quit date: 12/04/1958    Years since quitting: 63.8    Passive exposure: Never   Smokeless tobacco: Never  Vaping Use   Vaping Use: Never used  Substance Use Topics   Alcohol use: Yes    Comment: hardly ever   Drug use: No   Current Outpatient Medications  Medication Sig Dispense Refill   acetaminophen (TYLENOL) 325 MG tablet Take 2 tablets (650 mg total) by mouth 2 (two) times daily as needed (generalized pain). 100 tablet 2   albuterol (VENTOLIN HFA) 108 (90 Base) MCG/ACT inhaler Inhale 1-2 puffs into  the lungs every 4 (four) hours as needed for wheezing or shortness of breath. 1 each 2   apixaban (ELIQUIS) 2.5 MG TABS tablet Take 1 tablet (2.5 mg total) by mouth 2 (two) times daily. 180 tablet 1   ascorbic acid (VITAMIN C) 500 MG tablet Take 1 tablet (500 mg total) by mouth daily. 30 tablet 2   atorvastatin (LIPITOR) 80 MG tablet Take 1 tablet (80 mg total) by mouth daily. 90 tablet 1   carvedilol (COREG) 3.125 MG tablet Take 1 tablet (3.125 mg total) by mouth 2 (two) times daily with a meal. 180 tablet 1   Cholecalciferol (VITAMIN D3) 50 MCG (2000 UT) capsule Take 1 capsule (2,000 Units total) by mouth daily. 100 capsule 3   clotrimazole-betamethasone (LOTRISONE) cream Apply 1 Application topically 2 (two) times daily. Mouth corner rash 30 g 3   Cyanocobalamin 2500 MCG TABS Take 2,500 mcg by mouth daily. 30 tablet 3   docusate sodium (COLACE) 100 MG capsule Take 1-2 capsules (100-200 mg total) by mouth daily as needed for mild constipation. 100 capsule 1   furosemide (LASIX) 40 MG tablet Take 40 mg by mouth daily.     gabapentin (NEURONTIN) 300 MG capsule Take 1 capsule (300 mg total) by mouth at bedtime. 30 capsule 1   Golimumab 50 MG/0.5ML SOAJ Take 50 mg by mouth every 2 (two) months. Hold for the next 2 weeks per Orthopedic surgery recommendations. 0.3 mL    leflunomide (ARAVA) 20 MG tablet Take 1 tablet (20 mg total) by mouth daily. 30 tablet 1   levothyroxine (SYNTHROID) 88 MCG tablet Take 1 tablet (88 mcg total) by mouth daily before breakfast. 90 tablet 1   Multiple Vitamins-Minerals (CENTRUM ADULT PO) Take 1 tablet by mouth daily. 1 Tablet Daily.     pantoprazole (PROTONIX) 40 MG tablet Take 1 tablet (40 mg total) by mouth daily. 180 tablet 1   polyethylene glycol (MIRALAX / GLYCOLAX) 17 g packet Take 17 g by mouth daily as needed. 14 each 1   triamcinolone (NASACORT) 55 MCG/ACT AERO nasal inhaler Place 1 spray into the nose daily. 1 each 3   No current facility-administered  medications for this visit.   Allergies  Allergen Reactions   Nsaids Other (See Comments)    Stomach upset Told to avoid due to Eliquis   Aspirin Other (See Comments)    Stomach upset Told to avoid due to Eliquis   Codeine Nausea And Vomiting   Nitrostat [Nitroglycerin] Other (See Comments)    Bradycardia. Drop in heart rate      Review of Systems: All systems reviewed and negative except where noted in HPI.   Lab Results  Component Value Date   WBC 10.0 08/02/2022   HGB 7.8 (L) 08/30/2022   HCT 27.0 (L) 08/02/2022   MCV 97.4 08/02/2022   PLT 383 08/02/2022       Latest Ref Rng & Units 08/30/2022   12:49 PM 08/02/2022    9:27 AM 08/02/2022    9:18 AM  CBC  WBC 4.0 - 10.5 K/uL   10.0   Hemoglobin 12.0 - 15.0 g/dL 7.8  9.2  8.3   Hematocrit 36.0 - 46.0 %  27.0  25.9   Platelets 150 - 400 K/uL   383     Lab Results  Component Value Date   IRON 84 08/30/2022   TIBC 287 08/30/2022   FERRITIN 200 08/30/2022     Physical Exam: BP 130/68   Pulse 87  Ht 5\' 3"  (1.6 m)   Wt 142 lb (64.4 kg)   BMI 25.15 kg/m  Constitutional: Pleasant,well-developed, female in no acute distress. Neurological: Alert and oriented to person place and time. Psychiatric: Normal mood and affect. Behavior is normal.   ASSESSMENT: 82 y.o. female here for assessment of the following  1. Acute gastric ulcer with hemorrhage   2. Duodenal ulcer   3. Anemia, unspecified type   4. Altered bowel habits   5. Anticoagulated    As above, patient admitted with urosepsis in March, on Eliquis for history of PE, developed upper GI bleed.  EGD showed numerous gastric and duodenal ulcers, none with high risk stigmata for bleeding.  She was treated with PPI and did well with conservative measures.  Tested negative for H. pylori.  She has a chronic anemia but her iron studies look okay, this is likely due to her CKD and pending a Retacrit infusion.  Her blood counts are being monitored by her  nephrologist.  We discussed how aggressive she wanted to be with a surveillance endoscopy.  Given her recent hospitalization, supplemental oxygen use, deconditioning, she is at higher than average risk for anesthesia right now and I think risks may outweigh benefits for her. She had nothing overtly malignant appearing on the initial endoscopy. She agrees with this and prefers to hold off on endoscopy if she is otherwise stable.  She is asymptomatic otherwise.  I would continue Protonix indefinitely as long as she is on anticoagulation given risk for recurrent ulcers.  Otherwise, we will adjust her bowel regimen and switch to Metamucil only and stop stool softeners based on her symptoms.  Call if no better.  I will see her in 6 months for reassessment.  PLAN: - continue protonix 40mg  - daily while on Eliquis - holding off on EGD for now due to comorbidities - anemia likely due to CKD, seeing nephrology - stop stool softener, use only metamucil. Call if no better - no further colonoscopy surveillance exams due to comorbidities - f/u 6 months  Harlin Rain, MD [facility] Gastroenterology

## 2022-09-08 NOTE — Patient Instructions (Signed)
Continue pantoprazole 40 mg daily.  Discontinue stool softeners.  Please purchase the following medications over the counter and take as directed: Metamucil   _______________________________________________________  If your blood pressure at your visit was 140/90 or greater, please contact your primary care physician to follow up on this.  _______________________________________________________  If you are age 84 or older, your body mass index should be between 23-30. Your Body mass index is 25.15 kg/m. If this is out of the aforementioned range listed, please consider follow up with your Primary Care Provider.  If you are age 24 or younger, your body mass index should be between 19-25. Your Body mass index is 25.15 kg/m. If this is out of the aformentioned range listed, please consider follow up with your Primary Care Provider.   ________________________________________________________  The [place] [facility] providers would like to encourage you to use [facility] to communicate with providers for non-urgent requests or questions.  Due to long hold times on the telephone, sending your provider a message by [facility] may be a faster and more efficient way to get a response.  Please allow 48 business hours for a response.  Please remember that this is for non-urgent requests.  _______________________________________________________ Due to recent changes in healthcare laws, you may see the results of your imaging and laboratory studies on MyChart before your provider has had a chance to review them.  We understand that in some cases there may be results that are confusing or concerning to you. Not all laboratory results come back in the same time frame and the provider may be waiting for multiple results in order to interpret others.  Please give [place] 48 hours in order for your provider to thoroughly review all the results before contacting the office for clarification of your results.

## 2022-09-11 ENCOUNTER — Encounter: Payer: Self-pay | Admitting: Nephrology

## 2022-09-11 DIAGNOSIS — R0602 Shortness of breath: Secondary | ICD-10-CM | POA: Diagnosis not present

## 2022-09-11 DIAGNOSIS — R278 Other lack of coordination: Secondary | ICD-10-CM | POA: Diagnosis not present

## 2022-09-11 DIAGNOSIS — R498 Other voice and resonance disorders: Secondary | ICD-10-CM | POA: Diagnosis not present

## 2022-09-11 DIAGNOSIS — R41841 Cognitive communication deficit: Secondary | ICD-10-CM | POA: Diagnosis not present

## 2022-09-11 DIAGNOSIS — J449 Chronic obstructive pulmonary disease, unspecified: Secondary | ICD-10-CM | POA: Diagnosis not present

## 2022-09-11 DIAGNOSIS — M6281 Muscle weakness (generalized): Secondary | ICD-10-CM | POA: Diagnosis not present

## 2022-09-11 DIAGNOSIS — R2681 Unsteadiness on feet: Secondary | ICD-10-CM | POA: Diagnosis not present

## 2022-09-11 NOTE — Progress Notes (Signed)
  Care Coordination Note  09/11/2022 Name: [name] MRN: [id number] DOB: Sep 05, 1940  [name] is a 82 y.o. year old female who is a primary care patient of Plotnikov, Georgina Quint, MD and is actively engaged with the care management team. I reached out to [name] by phone today to assist with re-scheduling an initial visit with the RN Case Manager  Follow up plan: Telephone appointment with care management team member scheduled for: 09/26/2022  Burman Nieves, [facility] Care Coordination Care Guide Direct Dial: [phone]

## 2022-09-12 DIAGNOSIS — Z9181 History of falling: Secondary | ICD-10-CM | POA: Diagnosis not present

## 2022-09-12 DIAGNOSIS — M6281 Muscle weakness (generalized): Secondary | ICD-10-CM | POA: Diagnosis not present

## 2022-09-12 DIAGNOSIS — R278 Other lack of coordination: Secondary | ICD-10-CM | POA: Diagnosis not present

## 2022-09-13 ENCOUNTER — Encounter (HOSPITAL_COMMUNITY)
Admission: RE | Admit: 2022-09-13 | Discharge: 2022-09-13 | Disposition: A | Discharge status: Home / Self Care | Payer: Medicare PPO | Source: Ambulatory Visit | Attending: Nephrology | Admitting: Nephrology

## 2022-09-13 VITALS — BP 164/72 | HR 82

## 2022-09-13 DIAGNOSIS — N184 Chronic kidney disease, stage 4 (severe): Secondary | ICD-10-CM | POA: Insufficient documentation

## 2022-09-13 DIAGNOSIS — N179 Acute kidney failure, unspecified: Secondary | ICD-10-CM | POA: Diagnosis not present

## 2022-09-13 DIAGNOSIS — Z9889 Other specified postprocedural states: Secondary | ICD-10-CM | POA: Diagnosis not present

## 2022-09-13 DIAGNOSIS — I2510 Atherosclerotic heart disease of native coronary artery without angina pectoris: Secondary | ICD-10-CM | POA: Diagnosis not present

## 2022-09-13 DIAGNOSIS — E875 Hyperkalemia: Secondary | ICD-10-CM | POA: Diagnosis not present

## 2022-09-13 DIAGNOSIS — I129 Hypertensive chronic kidney disease with stage 1 through stage 4 chronic kidney disease, or unspecified chronic kidney disease: Secondary | ICD-10-CM | POA: Diagnosis not present

## 2022-09-13 DIAGNOSIS — D631 Anemia in chronic kidney disease: Secondary | ICD-10-CM | POA: Diagnosis not present

## 2022-09-13 DIAGNOSIS — K922 Gastrointestinal hemorrhage, unspecified: Secondary | ICD-10-CM | POA: Diagnosis not present

## 2022-09-13 LAB — POCT HEMOGLOBIN-HEMACUE: Hemoglobin: 9.6 g/dL — ABNORMAL LOW (ref 12.0–15.0)

## 2022-09-13 MED ORDER — EPOETIN ALFA-EPBX 10000 UNIT/ML IJ SOLN
INTRAMUSCULAR | Status: AC
  Administered 2022-09-13: 10000 [IU] via SUBCUTANEOUS
  Filled 2022-09-13: qty 1

## 2022-09-13 MED ORDER — EPOETIN ALFA-EPBX 10000 UNIT/ML IJ SOLN: 10000.0000 [IU] | INTRAMUSCULAR | Status: DC

## 2022-09-14 DIAGNOSIS — R498 Other voice and resonance disorders: Secondary | ICD-10-CM | POA: Diagnosis not present

## 2022-09-14 DIAGNOSIS — Z9181 History of falling: Secondary | ICD-10-CM | POA: Diagnosis not present

## 2022-09-14 DIAGNOSIS — R278 Other lack of coordination: Secondary | ICD-10-CM | POA: Diagnosis not present

## 2022-09-14 DIAGNOSIS — M6281 Muscle weakness (generalized): Secondary | ICD-10-CM | POA: Diagnosis not present

## 2022-09-14 DIAGNOSIS — J449 Chronic obstructive pulmonary disease, unspecified: Secondary | ICD-10-CM | POA: Diagnosis not present

## 2022-09-14 DIAGNOSIS — R0602 Shortness of breath: Secondary | ICD-10-CM | POA: Diagnosis not present

## 2022-09-14 DIAGNOSIS — R41841 Cognitive communication deficit: Secondary | ICD-10-CM | POA: Diagnosis not present

## 2022-09-15 DIAGNOSIS — R2681 Unsteadiness on feet: Secondary | ICD-10-CM | POA: Diagnosis not present

## 2022-09-15 DIAGNOSIS — M6281 Muscle weakness (generalized): Secondary | ICD-10-CM | POA: Diagnosis not present

## 2022-09-15 DIAGNOSIS — R278 Other lack of coordination: Secondary | ICD-10-CM | POA: Diagnosis not present

## 2022-09-18 ENCOUNTER — Telehealth: Payer: Self-pay | Admitting: Internal Medicine

## 2022-09-18 DIAGNOSIS — R0683 Snoring: Secondary | ICD-10-CM

## 2022-09-18 DIAGNOSIS — M6281 Muscle weakness (generalized): Secondary | ICD-10-CM | POA: Diagnosis not present

## 2022-09-18 DIAGNOSIS — R278 Other lack of coordination: Secondary | ICD-10-CM | POA: Diagnosis not present

## 2022-09-18 DIAGNOSIS — R2681 Unsteadiness on feet: Secondary | ICD-10-CM | POA: Diagnosis not present

## 2022-09-18 NOTE — Telephone Encounter (Signed)
Patient said she was scheduled for a sleep study, but it was canceled when she was dismissed from them. She would like to know if Dr. [name] can put in a new order for a sleep study. She would like a call back at [phone].

## 2022-09-19 DIAGNOSIS — R278 Other lack of coordination: Secondary | ICD-10-CM | POA: Diagnosis not present

## 2022-09-19 DIAGNOSIS — M6281 Muscle weakness (generalized): Secondary | ICD-10-CM | POA: Diagnosis not present

## 2022-09-19 DIAGNOSIS — Z9181 History of falling: Secondary | ICD-10-CM | POA: Diagnosis not present

## 2022-09-19 NOTE — Telephone Encounter (Signed)
I can only have her see her pulmonologist, Dr Sherene Sires, with [facility] pulmonology.  He can schedule sleep studies.  Thanks

## 2022-09-20 DIAGNOSIS — R2681 Unsteadiness on feet: Secondary | ICD-10-CM | POA: Diagnosis not present

## 2022-09-20 DIAGNOSIS — R41841 Cognitive communication deficit: Secondary | ICD-10-CM | POA: Diagnosis not present

## 2022-09-20 DIAGNOSIS — M6281 Muscle weakness (generalized): Secondary | ICD-10-CM | POA: Diagnosis not present

## 2022-09-20 DIAGNOSIS — J449 Chronic obstructive pulmonary disease, unspecified: Secondary | ICD-10-CM | POA: Diagnosis not present

## 2022-09-20 DIAGNOSIS — R0602 Shortness of breath: Secondary | ICD-10-CM | POA: Diagnosis not present

## 2022-09-20 DIAGNOSIS — R278 Other lack of coordination: Secondary | ICD-10-CM | POA: Diagnosis not present

## 2022-09-20 DIAGNOSIS — R498 Other voice and resonance disorders: Secondary | ICD-10-CM | POA: Diagnosis not present

## 2022-09-20 NOTE — Telephone Encounter (Signed)
Notified pt w/MD response.../lmb 

## 2022-09-21 ENCOUNTER — Other Ambulatory Visit (INDEPENDENT_AMBULATORY_CARE_PROVIDER_SITE_OTHER): Payer: Medicare PPO

## 2022-09-21 ENCOUNTER — Ambulatory Visit: Payer: Medicare PPO | Admitting: Orthopaedic Surgery

## 2022-09-21 DIAGNOSIS — Z96642 Presence of left artificial hip joint: Secondary | ICD-10-CM | POA: Diagnosis not present

## 2022-09-21 DIAGNOSIS — M6281 Muscle weakness (generalized): Secondary | ICD-10-CM | POA: Diagnosis not present

## 2022-09-21 DIAGNOSIS — Z9181 History of falling: Secondary | ICD-10-CM | POA: Diagnosis not present

## 2022-09-21 DIAGNOSIS — R278 Other lack of coordination: Secondary | ICD-10-CM | POA: Diagnosis not present

## 2022-09-21 NOTE — Progress Notes (Signed)
Office Visit Note   Patient: [name]           Date of Birth: 1940/09/07           MRN: [id number] Visit Date: 09/21/2022              Requested by: Sherry Kos, MD [address] [place],  [place] [address] PCP: Sherry Garter, MD   Assessment & Plan: Visit Diagnoses:  1. Status post total replacement of left hip     Plan: Impression is 82 year old female approximately 8 months status post left total hip replacement.  In regards to the implant this is stable without any evidence of loosening or subsidence.  I think her main issue is deconditioning and lack of muscle strength since she has had multiple medical problems that have caused her to be hospitalized.  She understands it may take an extended period of time to regain the muscle strength.  From my standpoint we can see her back in another 6 months with repeat x-rays.  Dental prophylaxis reinforced.  Follow-Up Instructions: Return in about 6 months (around 03/24/2023).   Orders:  Orders Placed This Encounter  Procedures   XR Pelvis 1-2 Views   No orders of the defined types were placed in this encounter.     Procedures: No procedures performed   Clinical Data: No additional findings.   Subjective: Chief Complaint  Patient presents with   Left Hip - Routine Post Op    S/p THA 01/23/22    HPI [name] is a very pleasant 82 year old female who is status post left total hip replacement on 01/23/2022.  She has had a tough time recovering from the surgery and she has been hospitalized a few times for various medical issues.  She did have a hip dislocation about 6 months ago that was reduced without any difficulty.  She is currently using a rollator.  Her main complaint is problems with pain with sit to stand transition.  Denies any groin pain.  Denies any pain at rest. Review of Systems   Objective: Vital Signs: There were no vitals taken for this visit.  Physical Exam  Ortho Exam Examination  of the left hip shows a fully healed surgical scar.  She has fluid painless range of motion of the hip.  She has no trochanteric tenderness. Specialty Comments:  No specialty comments available.  Imaging: XR Pelvis 1-2 Views  Result Date: 09/21/2022 Stable total hip replacement without complications    PMFS History: Patient Active Problem List   Diagnosis Date Noted   Obstructive sleep apnea 08/24/2022   Nasal bones, closed fracture 08/07/2022   Edema 07/26/2022   Acute gastric ulcer with hemorrhage 07/08/2022   Duodenal ulcer 07/08/2022   Dark stools 07/07/2022   Anemia 07/07/2022   E coli bacteremia 07/04/2022   Acute pyelonephritis 07/03/2022   Acute metabolic encephalopathy 07/03/2022   Hyponatremia 07/03/2022   Sepsis with acute renal failure (HCC) 07/03/2022   DNR (do not resuscitate)/DNI(Do Not Intubate) 07/03/2022   Generalized weakness 05/23/2022   Gallstones 03/17/2022   Acute renal failure superimposed on stage 4 chronic kidney disease (HCC) 03/06/2022   Metabolic acidosis, increased anion gap 03/06/2022   Hip dislocation, left (HCC) 03/06/2022   History of anemia due to chronic kidney disease 03/06/2022   History of DVT (deep vein thrombosis) 03/06/2022   Allergic rhinitis 03/06/2022   History of pulmonary embolism 02/06/2022   Vitamin D deficiency 01/30/2022   Slow transit constipation  01/30/2022   GERD (gastroesophageal reflux disease) 01/30/2022   Status post total replacement of left hip 01/23/2022   Melena    Upper GI bleed 11/17/2021   CKD (chronic kidney disease) stage 4, GFR 15-29 ml/min (HCC) - baseline SCr 2.5. follows with [name] with [facility] 11/17/2021   Primary osteoarthritis of left hip 10/06/2021   Left hip pain 12/09/2020   Aortic atherosclerosis (HCC) 09/07/2020   Gait disorder 06/08/2020   Neoplasm of uncertain behavior of skin 03/14/2020   Arthralgia 12/25/2019   Ankle swelling 08/08/2019   Osteoporosis 02/26/2018   Colon  polyp 06/28/2016   COPD (chronic obstructive pulmonary disease) (HCC) 05/28/2016   Upper airway cough syndrome 05/22/2016   Wrist pain, right 12/14/2015   Lung mass 03/02/2015   Pre-operative cardiovascular examination 02/21/2015   DOE (dyspnea on exertion) 02/16/2015   TIA (transient ischemic attack) 12/31/2014   Abnormal mental state 12/30/2014   Well adult exam 11/26/2014   Seasonal allergies 12/31/2013   Splenic artery aneurysm (HCC) 04/07/2013   Cerumen impaction 03/20/2013   Hyperlipidemia    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   Anticoagulated 11/08/2010   Rheumatoid arthritis (HCC) 06/23/2010   Pain in joint 11/26/2009   Acquired hypothyroidism 12/08/2006   Macular degeneration (senile) of retina 12/08/2006   Essential hypertension 12/08/2006   Diaphragmatic hernia 12/08/2006   ASPLENIA 12/08/2006   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) 12/22/1994    Class: History of   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; now atretic and significantly improved LAD lesion without intervention 12/22/1994    Class: History of   Past Medical History:  Diagnosis Date   Abdominal aortic aneurysm (HCC)    REPAIRED IN 1996 BY [name]  AND HAS RECENTLY BEEN FOLLOWED BY [name]   Acquired asplenia     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 Most likely scar. Mild subendocardial ischemia. EF 71% LOW RISK.    Cervical disc disease     fracture   Chronic kidney disease    CKD4   COPD (chronic obstructive pulmonary disease) (HCC)    Diverticulosis    DVT (deep venous thrombosis) (HCC)    RLE; Sept '23   History of DVT (deep vein thrombosis) 03/06/2022   History of pulmonary embolus (PE)    Oct '23   Hyperlipidemia    Hypertension    Hypothyroidism (acquired)    hypo   Myocardial infarction (HCC) 11/2014   TIA   Polymyalgia rheumatica (HCC)    2011 Dr. Kellie Simmering   Pulmonary emboli [facility]) 02/06/2022   01/2022 PE/DVT post-op. Lower GI bleed     Treat edema  Start Eliquis at the reduced dose due to Creat>1.5 and age >64   Rheumatoid arthritis (HCC) 2011   SherryTruslow; fracture knees, hands and wrists -    S/P CABG x 1 1996   CABG--LIMA-LAD for ostial  LAD (not felt to be PCI amenable). EF NORMAL then; LIMA now atretic   Shortness of breath dyspnea    with exertion   Stroke (HCC) 11/2014   TIA    Urinary frequency     Family History  Problem Relation Age of Onset   Hypertension Mother    Diabetes Mother    Heart disease Mother    Hyperlipidemia Mother    Stroke Father    Hyperlipidemia Sister    Hypertension Sister    Hypertension Daughter    Cancer Paternal Uncle        Deceased from cancer not sure of site   Hypertension Son    Hyperlipidemia Son    Hyperlipidemia Son    Hypertension Son    Hyperlipidemia Son    Hypertension Son    Hypertension Other    Coronary artery disease Other    Asthma Neg Hx    Colon cancer Neg Hx     Past Surgical History:  Procedure Laterality Date   ABDOMINAL AORTIC ANEURYSM REPAIR  1996   Complicated by mesenteric artery stenosis and splenic artery infarction with acquired Asplenia   APPENDECTOMY     BIOPSY  07/08/2022   Procedure: BIOPSY;  Surgeon: Benancio Deeds, MD;  Location: [facility] ENDOSCOPY;  Service: Gastroenterology;;   [name]  07/2011   right foot   CARDIAC CATHETERIZATION  2005   (Most recent CATH) - ostial LAD lesion 20-30% (down from 90%  initially). Atretic LIMA. Minimal disease the RCA and Circumflex system.   CARPAL TUNNEL RELEASE Left    CATARACT EXTRACTION Bilateral    CERVICAL SPINE SURGERY     plate 8295 Dr. Wynetta Emery   COLONOSCOPY     CORONARY ARTERY BYPASS GRAFT  1996   INCLUDED AN INTERNAL MAMMARY ARTERY TO THE LAD. EF WAS NORMAL   ESOPHAGOGASTRODUODENOSCOPY (EGD) WITH PROPOFOL N/A 07/08/2022   Procedure: ESOPHAGOGASTRODUODENOSCOPY (EGD) WITH PROPOFOL;  Surgeon: Benancio Deeds, MD;  Location: [facility] ENDOSCOPY;  Service: Gastroenterology;  Laterality: N/A;   INGUINAL HERNIA REPAIR Right    LAPAROSCOPIC APPENDECTOMY N/A 06/28/2016   Procedure: APPENDECTOMY LAPAROSCOPIC;  Surgeon: [name], MD;  Location: [facility] ORS;  Service: General;  Laterality: N/A;   NM MYOVIEW LTD  06/2010   Fixed anteroseptal, apical and inferoapical defect with moderate size. Most likely scar. Mild subendocardial ischemia. EF 71% LOW RISK.    SPLENECTOMY     TOTAL HIP ARTHROPLASTY Left 01/23/2022   Procedure: LEFT TOTAL HIP ARTHROPLASTY ANTERIOR APPROACH;  Surgeon: Sherry Kos, MD;  Location: [facility] OR;  Service: Orthopedics;  Laterality: Left;  3-C   TRANSTHORACIC ECHOCARDIOGRAM  12/2014   [facility]: Normal LV size & function. EF 55-60%,    vagina polyp     VIDEO ASSISTED THORACOSCOPY (VATS)/WEDGE RESECTION Left 03/02/2015   Procedure: VIDEO ASSISTED THORACOSCOPY (VATS), MINI THORACOTOMY, LEFT UPPER LOBE WEDGE, TAKE DOWN OF INTERNAL MAMMARY LESIONS, PLACEMENT OF ON-Q PUMP;  Surgeon: [name], MD;  Location: [facility] OR;  Service: Thoracic;  Laterality: Left;   VIDEO BRONCHOSCOPY N/A 03/02/2015   Procedure: BRONCHOSCOPY;  Surgeon: [name], MD;  Location: [facility] OR;  Service: Thoracic;  Laterality: N/A;   VIDEO BRONCHOSCOPY WITH ENDOBRONCHIAL NAVIGATION N/A 10/08/2017   Procedure: VIDEO BRONCHOSCOPY WITH ENDOBRONCHIAL NAVIGATION WITH BIOPSIES OF LEFT UPPER LOBE AND LEFT LOWER LOBE;  Surgeon: [name], MD;  Location: [facility]  OR;  Service: Thoracic;  Laterality: N/A;   Social History   Occupational History   Occupation:  retired    Associate Professor: RETIRED  Tobacco Use   Smoking status: Former    Types: Cigarettes    Quit date: 12/04/1958    Years since quitting: 63.8    Passive exposure: Never   Smokeless tobacco: Never  Vaping Use   Vaping Use: Never used  Substance and Sexual Activity   Alcohol use: Yes    Comment: hardly ever   Drug use: No   Sexual activity: Not Currently

## 2022-09-22 DIAGNOSIS — J449 Chronic obstructive pulmonary disease, unspecified: Secondary | ICD-10-CM | POA: Diagnosis not present

## 2022-09-22 DIAGNOSIS — R41841 Cognitive communication deficit: Secondary | ICD-10-CM | POA: Diagnosis not present

## 2022-09-22 DIAGNOSIS — R0602 Shortness of breath: Secondary | ICD-10-CM | POA: Diagnosis not present

## 2022-09-22 DIAGNOSIS — R498 Other voice and resonance disorders: Secondary | ICD-10-CM | POA: Diagnosis not present

## 2022-09-24 DIAGNOSIS — J449 Chronic obstructive pulmonary disease, unspecified: Secondary | ICD-10-CM | POA: Diagnosis not present

## 2022-09-25 DIAGNOSIS — R41841 Cognitive communication deficit: Secondary | ICD-10-CM | POA: Diagnosis not present

## 2022-09-25 DIAGNOSIS — J449 Chronic obstructive pulmonary disease, unspecified: Secondary | ICD-10-CM | POA: Diagnosis not present

## 2022-09-25 DIAGNOSIS — Z9181 History of falling: Secondary | ICD-10-CM | POA: Diagnosis not present

## 2022-09-25 DIAGNOSIS — R278 Other lack of coordination: Secondary | ICD-10-CM | POA: Diagnosis not present

## 2022-09-25 DIAGNOSIS — R498 Other voice and resonance disorders: Secondary | ICD-10-CM | POA: Diagnosis not present

## 2022-09-25 DIAGNOSIS — M6281 Muscle weakness (generalized): Secondary | ICD-10-CM | POA: Diagnosis not present

## 2022-09-25 DIAGNOSIS — R0602 Shortness of breath: Secondary | ICD-10-CM | POA: Diagnosis not present

## 2022-09-26 ENCOUNTER — Ambulatory Visit: Payer: Medicare PPO | Admitting: Nurse Practitioner

## 2022-09-26 ENCOUNTER — Ambulatory Visit: Payer: Medicare PPO

## 2022-09-26 ENCOUNTER — Ambulatory Visit: Payer: Self-pay

## 2022-09-26 ENCOUNTER — Encounter: Payer: Self-pay | Admitting: Nurse Practitioner

## 2022-09-26 VITALS — BP 140/90 | HR 100 | Ht 63.0 in | Wt 132.8 lb

## 2022-09-26 DIAGNOSIS — G4719 Other hypersomnia: Secondary | ICD-10-CM | POA: Insufficient documentation

## 2022-09-26 DIAGNOSIS — J90 Pleural effusion, not elsewhere classified: Secondary | ICD-10-CM | POA: Diagnosis not present

## 2022-09-26 DIAGNOSIS — M6281 Muscle weakness (generalized): Secondary | ICD-10-CM | POA: Diagnosis not present

## 2022-09-26 DIAGNOSIS — R278 Other lack of coordination: Secondary | ICD-10-CM | POA: Diagnosis not present

## 2022-09-26 DIAGNOSIS — J9601 Acute respiratory failure with hypoxia: Secondary | ICD-10-CM

## 2022-09-26 DIAGNOSIS — J432 Centrilobular emphysema: Secondary | ICD-10-CM | POA: Diagnosis not present

## 2022-09-26 DIAGNOSIS — R2681 Unsteadiness on feet: Secondary | ICD-10-CM | POA: Diagnosis not present

## 2022-09-26 NOTE — Assessment & Plan Note (Signed)
She has snoring, excessive daytime sleepiness, nocturnal apneic events, poor sleep quality. Given this,  I am concerned she could have sleep disordered breathing with obstructive sleep apnea. She will need sleep study for further evaluation.    - had an extensive discussion regarding the adverse health consequences related to untreated sleep disordered breathing - specifically discussed the risks for hypertension, coronary artery disease, cardiac dysrhythmias, cerebrovascular disease, and diabetes - lifestyle modification discussed   - discussed how sleep disruption can increase risk of accidents, particularly when driving - safe driving practices were discussed

## 2022-09-26 NOTE — Patient Outreach (Signed)
  Care Coordination   Initial Visit Note   09/26/2022 Name: [name] MRN: [id number] DOB: 01-20-1941  [name] is a 82 y.o. year old female who sees Plotnikov, Georgina Quint, MD for primary care. I spoke with  [name] by phone today.  What matters to the patients health and wellness today?  [name] states she returned home from SNF around the end of May. She and her husband lives at [facility] independent living. She has been to provider visits and reports her husband provides transportation. She reports she is doing better. She has seen her orthopedic provider and reports her hip is improving-last seen 09/21/22. [name] states she feels that she is not drinking enough water, therefore, she has decided to stop taking diuretic and thyroid medication. After further discussion with patient, [name] states she will continue to take thyroid medication and contact nephrologist(per patient furosemide prescribed by nephrologist) for further direction regarding her health concerns and medications.  Goals Addressed             This Visit's Progress    Assist with maintaining health-continue to improve post rehab stay       Interventions Today    Flowsheet Row Most Recent Value  Chronic Disease   Chronic disease during today's visit Other  [SNF/rehab stay post hip fracture/nose fracture.]  General Interventions   General Interventions Discussed/Reviewed General Interventions Discussed, Durable Medical Equipment (DME), Doctor Visits  [discussed care coordination program. provided contact number and encouraged to call for care coordination needs]  Doctor Visits Discussed/Reviewed Doctor Visits Discussed  [advised patient to contact nephrologist to discuss medications/furosemide to seek nephrology recommendations.]  Durable Medical Equipment (DME) Oxygen, Walker  Education Interventions   Education Provided Provided Education  Provided Verbal Education On --  [discussed  importance of attending provider appointments as recommended,  eat heatlhy, advised importance of staying hydrated.]  Pharmacy Interventions   Pharmacy Dicussed/Reviewed Pharmacy Topics Discussed, Medications and their functions, Medication Adherence, Affording Medications  [discussed thyroid medication and function. as well as importance of discussing medication concerns with provider prior to stopping. advised to contact provider about her concerns.]  Safety Interventions   Safety Discussed/Reviewed Safety Discussed, Fall Risk, Home Safety  Home Safety Assistive Devices            SDOH assessments and interventions completed:  No recently completed. No changes.  Care Coordination Interventions:  Yes, provided   Follow up plan: Follow up call scheduled for 10/04/22    Encounter Outcome:  Pt. Visit Completed   Kathyrn Sheriff, RN, MSN, BSN, CCM [facility] Care Coordinator [phone]

## 2022-09-26 NOTE — Assessment & Plan Note (Addendum)
Felt to be secondary to atelectasis and resolved with pulmonary toileting during her hospitalization; however at rehab, she had nocturnal desaturations so was restarted on supplemental O2. She has not required therapy during the day. Walk test today on room air without desaturations. Question if nocturnal events are related to untreated OSA. HST planned for further evaluation. If no significant sleep disordered breathing, we will obtain ONO on room air and determine if she needs to continue O2 therapy at night or not. Goal >88-90%. Possible this was medication induced; no longer on sedating medications.

## 2022-09-26 NOTE — Patient Instructions (Signed)
Continue Albuterol inhaler 2 puffs every 6 hours as needed for shortness of breath or wheezing. Notify if symptoms persist despite rescue inhaler/neb use or if you are having to use this frequently Monitor oxygen levels at home for goal >88-90%. You can discontinue your daytime oxygen.   Given your symptoms, I am concerned that you may have sleep disordered breathing with sleep apnea. You will need a sleep study for further evaluation. Someone will contact you to schedule this. If you do not have any evidence of significant sleep apnea, we will obtain overnight oxygen test to see if we can discontinue your oxygen. Do not use your oxygen during your study.  We discussed how untreated sleep apnea puts an individual at risk for cardiac arrhthymias, pulm HTN, DM, stroke and increases their risk for daytime accidents. We also briefly reviewed treatment options including weight loss, side sleeping position, oral appliance, CPAP therapy or referral to ENT for possible surgical options  Use caution when driving and pull over if you become sleepy.  Follow up in 5-6 weeks with Sherry Yumna Ebers,NP to go over sleep study results, or sooner, if needed

## 2022-09-26 NOTE — Assessment & Plan Note (Signed)
Mild obstruction. She is not currently on maintenance regimen. She has minimal symptom burden. Would like to remain off daily inhalers if possible. We will continue PRN SABA. Advised that if she is having increased dyspnea, cough or use of rescue, we would need to re-evaluate her maintenance regimen. Action plan in place.  Patient Instructions  Continue Albuterol inhaler 2 puffs every 6 hours as needed for shortness of breath or wheezing. Notify if symptoms persist despite rescue inhaler/neb use or if you are having to use this frequently Monitor oxygen levels at home for goal >88-90%. You can discontinue your daytime oxygen.   Given your symptoms, I am concerned that you may have sleep disordered breathing with sleep apnea. You will need a sleep study for further evaluation. Someone will contact you to schedule this. If you do not have any evidence of significant sleep apnea, we will obtain overnight oxygen test to see if we can discontinue your oxygen. Do not use your oxygen during your study.  We discussed how untreated sleep apnea puts an individual at risk for cardiac arrhthymias, pulm HTN, DM, stroke and increases their risk for daytime accidents. We also briefly reviewed treatment options including weight loss, side sleeping position, oral appliance, CPAP therapy or referral to ENT for possible surgical options  Use caution when driving and pull over if you become sleepy.  Follow up in 5-6 weeks with Katie Luken Shadowens,NP to go over sleep study results, or sooner, if needed

## 2022-09-26 NOTE — Progress Notes (Signed)
@Patient  ID: [name], female    DOB: 12-04-1940, 82 y.o.   MRN: [id number]  Chief Complaint  Patient presents with   Follow-up    2l oxygen, pt was suppose to have a sleep study.     Referring provider: Plotnikov, Georgina Quint, MD  HPI: 82 year old female, former smoker followed for mild COPD and upper airway cough. She is a patient of Dr. Thurston Hole and last seen in office July 2023. Past medical history significant for splenic artery aneurysm, TIA, HTN, mesenteric artery stenosis, CAD s/p CABG, ruptured AAA s/p repair, diaphragmatic hernia, allergic rhinitis, OSA not on CPAP, GERD, hypothyroid, RA, CKD.  TEST/EVENTS:  08/2019 PFT: FVC 96, FEV1 87, ratio 71, TLC 148, DLCOunc 98. Positive BD 02/07/2022 echo: EF 65-70%, severe LVH. GIIDD. RV size and function. Normal PASP. Trivial MR.  07/15/2022 CXR: b/l lower lobe airspace opacities, increased from prior. Small b/l effusions.   11/07/2021: OV with Dr. Sherene Sires. Maintained on Stiolto and gabapentin. Limited mobility due to hip - plans for L THR. Surgical evaluation completed. Follow up yearly or as needed. Walk test without desaturations.   09/26/2022: Today - follow up Patient presents today for follow up. She was hospitalized in March for sepsis due to e coli pyelonephritis with e coli bacteremia and acute hypoxic respiratory failure from 07/02/2022-07/25/2022. Hypoxia was felt to be related to atelectasis and improved with pulmonary toileting. She was discharged to rehab on room air. However while there, she was noted to stop breathing when she was sleeping at night and had some low O2 readings so she was restarted on supplemental oxygen. She was set up for a home sleep study but once she was discharged, they canceled this appointment. She has just been using oxygen when she sleeps at night. Her O2 levels during the day have been above 90% on room air. She does not have any trouble with her breathing. Gabapentin was stopped in rehab and cough has not  returned. She is feeling better. She is not currently on any maintenance inhalers. Has not felt like she's needed her rescue inhaler but does have on available.  She tells me she continues to have daytime tiredness, which has been ongoing for years. She does feel like this got worse after her hospitalization but seems to be back to her normal now. She snores at night. She did have some witnessed apneic events at rehab while sleeping. She takes a nap most days. Tends to doze if she's sitting in a recliner. She doesn't feel like she rests well. She does not drive.  Denies sleep parasomnias/paralysis. She gets around 7-8 hours of sleep a night. Wakes a few times to use the restroom. No sleep aids. No excessive caffeine intake.    Allergies  Allergen Reactions   Nsaids Other (See Comments)    Stomach upset Told to avoid due to Eliquis   Aspirin Other (See Comments)    Stomach upset Told to avoid due to Eliquis   Codeine Nausea And Vomiting   Nitrostat [Nitroglycerin] Other (See Comments)    Bradycardia. Drop in heart rate     Immunization History  Administered Date(s) Administered   Covid-19, Mrna,Vaccine(Spikevax)40yrs and older 06/09/2022   Fluad Quad(high Dose 65+) 02/03/2021, 01/16/2022   Influenza Whole 05/02/2001, 01/27/2008, 01/12/2010, 12/31/2010, 01/25/2012   Influenza, High Dose Seasonal PF 01/14/2014, 02/13/2015, 12/21/2016, 01/09/2018, 12/29/2018   Influenza,inj,Quad PF,6+ Mos 12/23/2015   Influenza-Unspecified 02/03/2013, 01/13/2014, 02/13/2015, 01/20/2020   Meningococcal polysaccharide vaccine (MPSV4) 03/21/2012  Moderna SARS-COV2 Booster Vaccination 03/15/2020   Moderna Sars-Covid-2 Vaccination 06/02/2019, 06/30/2019   PPD Test 11/18/2013   Pfizer Covid-19 Vaccine Bivalent Booster 2yrs & up 01/19/2021, 10/12/2021   Pneumococcal Conjugate-13 03/20/2013   Pneumococcal Polysaccharide-23 05/02/2000, 06/23/2010, 02/26/2018   Td 05/01/2006   Tdap 06/01/2016   Zoster  Recombinat (Shingrix) 05/16/2017, 07/20/2017   Zoster, Live 01/17/2006    Past Medical History:  Diagnosis Date   Abdominal aortic aneurysm (HCC)    REPAIRED IN 1996 BY [name]  AND HAS RECENTLY BEEN FOLLOWED BY [name]   Acquired asplenia     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 Most likely scar. Mild subendocardial ischemia. EF 71% LOW RISK.    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 Kellie Simmering   Pulmonary emboli [facility]) 02/06/2022   01/2022 PE/DVT post-op. Lower GI bleed     Treat edema  Start Eliquis at the reduced dose due to Creat>1.5 and age >12   Rheumatoid arthritis (HCC) 2011   Dr.Truslow; fracture knees, hands and wrists -    S/P CABG x 1 1996   CABG--LIMA-LAD for ostial LAD (not felt to be PCI amenable). EF NORMAL then; LIMA now atretic   Shortness of breath dyspnea    with exertion   Stroke (HCC) 11/2014   TIA    Urinary frequency     Tobacco History: Social History   Tobacco Use  Smoking Status Former   Types: Cigarettes   Quit date: 12/04/1958   Years since  quitting: 63.8   Passive exposure: Never  Smokeless Tobacco Never   Counseling given: Not Answered   Outpatient Medications Prior to Visit  Medication Sig Dispense Refill   acetaminophen (TYLENOL) 325 MG tablet Take 2 tablets (650 mg total) by mouth 2 (two) times daily as needed (generalized pain). 100 tablet 2   albuterol (VENTOLIN HFA) 108 (90 Base) MCG/ACT inhaler Inhale 1-2 puffs into the lungs every 4 (four) hours as needed for wheezing or shortness of breath. 1 each 2   apixaban (ELIQUIS) 2.5 MG TABS tablet Take 1 tablet (2.5 mg total) by mouth 2 (two) times daily. 180 tablet 1   ascorbic acid (VITAMIN C) 500 MG tablet Take 1 tablet (500 mg total) by mouth daily. 30 tablet 2   atorvastatin (LIPITOR) 80 MG tablet Take 1 tablet (80 mg total) by mouth daily. 90 tablet 1   carvedilol (COREG) 3.125 MG tablet Take 1  tablet (3.125 mg total) by mouth 2 (two) times daily with a meal. 180 tablet 1   Cholecalciferol (VITAMIN D3) 50 MCG (2000 UT) capsule Take 1 capsule (2,000 Units total) by mouth daily. 100 capsule 3   clotrimazole-betamethasone (LOTRISONE) cream Apply 1 Application topically 2 (two) times daily. Mouth corner rash 30 g 3   Cyanocobalamin 2500 MCG TABS Take 2,500 mcg by mouth daily. 30 tablet 3   docusate sodium (COLACE) 100 MG capsule Take 1-2 capsules (100-200 mg total) by mouth daily as needed for mild constipation. 100 capsule 1   furosemide (LASIX) 40 MG tablet Take 20 mg by mouth daily. MWF     Golimumab 50 MG/0.5ML SOAJ Take 50 mg by mouth every 2 (two) months. Hold for the next 2 weeks per Orthopedic surgery recommendations. 0.3 mL    leflunomide (ARAVA) 20 MG tablet Take 1 tablet (20 mg total) by mouth daily. (Patient taking differently: Take 10 mg by mouth daily.) 30 tablet 1   levothyroxine (SYNTHROID) 88 MCG tablet Take 1 tablet (88 mcg total) by mouth daily before breakfast. 90 tablet 1   Multiple Vitamins-Minerals (CENTRUM ADULT PO) Take 1 tablet by mouth daily. 1  Tablet Daily.     pantoprazole (PROTONIX) 40 MG tablet Take 1 tablet (40 mg total) by mouth daily. 180 tablet 1   polyethylene glycol (MIRALAX / GLYCOLAX) 17 g packet Take 17 g by mouth daily as needed. 14 each 1   triamcinolone (NASACORT) 55 MCG/ACT AERO nasal inhaler Place 1 spray into the nose daily. 1 each 3   gabapentin (NEURONTIN) 300 MG capsule Take 1 capsule (300 mg total) by mouth at bedtime. (Patient not taking: Reported on 09/26/2022) 30 capsule 1   No facility-administered medications prior to visit.     Review of Systems:   Constitutional: No weight loss or gain, night sweats, fevers, chills,or lassitude.  +fatigue  HEENT: No headaches, difficulty swallowing, tooth/dental problems, or sore throat. No sneezing, itching, ear ache, nasal congestion, or post nasal drip CV:  No chest pain, orthopnea, PND, swelling in lower extremities, anasarca, dizziness, palpitations, syncope Resp: +snoring, witnessed apneas. No shortness of breath with exertion or at rest. No excess mucus or change in color of mucus. No productive or non-productive. No hemoptysis. No wheezing.  No chest wall deformity GI:  No heartburn, indigestion, abdominal pain, nausea, vomiting, diarrhea, change in bowel habits, loss of appetite, bloody stools.  GU: No dysuria, change in color of urine, urgency or frequency.   Skin: No rash, lesions, ulcerations MSK:  No joint pain or swelling.   Neuro: No dizziness or lightheadedness.  Psych: No depression or anxiety. Mood stable. +sleep disturbance     Physical Exam:  BP (!) 140/90   Pulse 100   Ht 5\' 3"  (1.6 m)   Wt 132 lb 12.8 oz (60.2 kg)   SpO2 95%   BMI 23.52 kg/m   GEN: Pleasant, interactive, well-kempt; in no acute distress. HEENT:  Normocephalic and atraumatic. PERRLA. Sclera white. Nasal turbinates pink, moist and patent bilaterally. No rhinorrhea present. Oropharynx pink and moist, without exudate or edema. No lesions, ulcerations, or postnasal drip.  Mallampati II/III NECK:  Supple w/ fair ROM. No JVD present. Normal carotid impulses w/o bruits. Thyroid symmetrical with no goiter or nodules palpated. No lymphadenopathy.   CV: RRR, no m/r/g, no peripheral edema. Pulses intact, +2 bilaterally. No cyanosis, pallor or clubbing. PULMONARY:  Unlabored, regular breathing. Clear bilaterally A&P w/o wheezes/rales/rhonchi. No accessory muscle use.  GI:  BS present and normoactive. Soft, non-tender to palpation. No organomegaly or masses detected.  MSK: No erythema, warmth or tenderness. Cap refil <2 sec all extrem. No deformities or joint swelling noted.  Neuro: A/Ox3. No focal deficits noted.   Skin: Warm, no lesions or rashe Psych: Normal affect and behavior. Judgement and thought content appropriate.     Lab Results:  CBC    Component Value Date/Time   WBC 10.0 08/02/2022 0918   RBC 2.66 (L) 08/02/2022 0918   HGB 9.6 (L) 09/13/2022 1153   HCT 27.0 (L) 08/02/2022 0927   PLT 383 08/02/2022 0918   MCV 97.4 08/02/2022 0918   MCV 90.9 01/29/2015 1524   MCH 31.2 08/02/2022 0918   MCHC 32.0 08/02/2022 0918   RDW 18.1 (H) 08/02/2022 0918   LYMPHSABS 2.1 07/20/2022 0423   MONOABS 1.8 (H) 07/20/2022 0423   EOSABS 0.4 07/20/2022 0423   BASOSABS 0.2 (H) 07/20/2022 0423    BMET    Component Value Date/Time   NA 138 09/05/2022 1155   NA 141 08/01/2022 0800   K 4.4 09/05/2022 1155   CL 94 (L) 09/05/2022 1155   CO2 32 09/05/2022 1155   GLUCOSE 74 09/05/2022 1155   BUN 60 (H) 09/05/2022 1155   BUN 57 (A) 08/01/2022 0800   CREATININE 3.46 (H) 09/05/2022 1155   CREATININE 2.19 (H) 12/26/2019 1005   CALCIUM 10.2 09/05/2022 1155   GFRNONAA 13 (L) 08/02/2022 0918   GFRNONAA 20 (L) 12/26/2019 1005   GFRAA 24 (L) 12/26/2019 1005    BNP    Component Value Date/Time   BNP 865.3 (H) 07/12/2022 0826   BNP 445 (H) 08/08/2019 1538     Imaging:  XR Pelvis 1-2 Views  Result Date: 09/21/2022 Stable total hip replacement without  complications        Latest Ref Rng & Units 09/18/2019    8:47 AM 01/14/2018    9:00 AM 02/19/2015    9:43 AM  PFT Results  FVC-Pre L 2.35  2.29  2.56   FVC-Predicted Pre % 96  91  97   FVC-Post L 2.57  2.47  2.68   FVC-Predicted Post % 105  98  101   Pre FEV1/FVC % % 67  62  62   Post FEV1/FCV % % 71  68  66   FEV1-Pre L 1.58  1.42  1.58   FEV1-Predicted Pre % 87  76  80   FEV1-Post L 1.83  1.68  1.78   DLCO uncorrected ml/min/mmHg 17.91  19.35  16.27   DLCO UNC% % 98  84  71   DLCO corrected ml/min/mmHg 17.91     DLCO COR %Predicted % 98     DLVA Predicted % 91  90  78   TLC L 7.29  5.45  5.41   TLC % Predicted % 148  111  110   RV % Predicted % 206  136  123     No results found for: "NITRICOXIDE"      Assessment & Plan:   COPD (chronic obstructive pulmonary disease) (HCC) Mild obstruction. She is not currently on maintenance regimen. She has minimal symptom burden. Would like to remain off daily inhalers if possible. We will continue PRN SABA. Advised that if she is having increased dyspnea, cough or use of rescue, we would need to re-evaluate her maintenance regimen. Action plan in place.  Patient Instructions  Continue Albuterol inhaler 2 puffs every 6 hours as needed for shortness  of breath or wheezing. Notify if symptoms persist despite rescue inhaler/neb use or if you are having to use this frequently Monitor oxygen levels at home for goal >88-90%. You can discontinue your daytime oxygen.   Given your symptoms, I am concerned that you may have sleep disordered breathing with sleep apnea. You will need a sleep study for further evaluation. Someone will contact you to schedule this. If you do not have any evidence of significant sleep apnea, we will obtain overnight oxygen test to see if we can discontinue your oxygen. Do not use your oxygen during your study.  We discussed how untreated sleep apnea puts an individual at risk for cardiac arrhthymias, pulm HTN, DM,  stroke and increases their risk for daytime accidents. We also briefly reviewed treatment options including weight loss, side sleeping position, oral appliance, CPAP therapy or referral to ENT for possible surgical options  Use caution when driving and pull over if you become sleepy.  Follow up in 5-6 weeks with Florentina Addison Terril Chestnut,NP to go over sleep study results, or sooner, if needed      Acute respiratory failure (HCC) Felt to be secondary to atelectasis and resolved with pulmonary toileting during her hospitalization; however at rehab, she had nocturnal desaturations so was restarted on supplemental O2. She has not required therapy during the day. Walk test today on room air without desaturations. Question if nocturnal events are related to untreated OSA. HST planned for further evaluation. If no significant sleep disordered breathing, we will obtain ONO on room air and determine if she needs to continue O2 therapy at night or not. Goal >88-90%. Possible this was medication induced; no longer on sedating medications.   Excessive daytime sleepiness She has snoring, excessive daytime sleepiness, nocturnal apneic events, poor sleep quality. Given this,  I am concerned she could have sleep disordered breathing with obstructive sleep apnea. She will need sleep study for further evaluation.    - had an extensive discussion regarding the adverse health consequences related to untreated sleep disordered breathing - specifically discussed the risks for hypertension, coronary artery disease, cardiac dysrhythmias, cerebrovascular disease, and diabetes - lifestyle modification discussed   - discussed how sleep disruption can increase risk of accidents, particularly when driving - safe driving practices were discussed   I spent 45 minutes of dedicated to the care of this patient on the date of this encounter to include pre-visit review of records, face-to-face time with the patient discussing conditions above,  post visit ordering of testing, clinical documentation with the electronic health record, making appropriate referrals as documented, and communicating necessary findings to members of the patients care team.  Noemi Chapel, NP 09/26/2022  Pt aware and understands NP's role.

## 2022-09-26 NOTE — Patient Instructions (Signed)
Visit Information  Thank you for taking time to visit with me today. Please don't hesitate to contact me if I can be of assistance to you.   Following are the goals we discussed today:  Continue to attend provider visits as scheduled Take medications as prescribed. Contact your provider with questions or concerns regarding medications Contact RN Care Coordinator if care coordination needs  Our next appointment is by telephone on 10/04/22 at 1:30 pm  Please call the care guide team at [phone] if you need to cancel or reschedule your appointment.   If you are experiencing a Mental Health or Behavioral Health Crisis or need someone to talk to, please call the Suicide and Crisis Lifeline: 47  Kathyrn Sheriff, RN, MSN, BSN, CCM [facility] Care Coordinator [phone]

## 2022-09-27 ENCOUNTER — Encounter (HOSPITAL_COMMUNITY)
Admission: RE | Admit: 2022-09-27 | Discharge: 2022-09-27 | Disposition: A | Discharge status: Home / Self Care | Payer: Medicare PPO | Source: Ambulatory Visit | Attending: Nephrology | Admitting: Nephrology

## 2022-09-27 VITALS — BP 166/65 | HR 95 | Temp 97.1°F | Resp 17

## 2022-09-27 DIAGNOSIS — H35372 Puckering of macula, left eye: Secondary | ICD-10-CM | POA: Diagnosis not present

## 2022-09-27 DIAGNOSIS — N184 Chronic kidney disease, stage 4 (severe): Secondary | ICD-10-CM

## 2022-09-27 DIAGNOSIS — Z961 Presence of intraocular lens: Secondary | ICD-10-CM | POA: Diagnosis not present

## 2022-09-27 DIAGNOSIS — H5213 Myopia, bilateral: Secondary | ICD-10-CM | POA: Diagnosis not present

## 2022-09-27 DIAGNOSIS — H26491 Other secondary cataract, right eye: Secondary | ICD-10-CM | POA: Diagnosis not present

## 2022-09-27 LAB — POCT HEMOGLOBIN-HEMACUE: Hemoglobin: 11.9 g/dL — ABNORMAL LOW (ref 12.0–15.0)

## 2022-09-27 MED ORDER — EPOETIN ALFA-EPBX 10000 UNIT/ML IJ SOLN
INTRAMUSCULAR | Status: AC
  Filled 2022-09-27: qty 2

## 2022-09-27 MED ORDER — EPOETIN ALFA-EPBX 10000 UNIT/ML IJ SOLN
20000.0000 [IU] | INTRAMUSCULAR | Status: DC
  Administered 2022-09-27: 20000 [IU] via SUBCUTANEOUS

## 2022-09-28 DIAGNOSIS — R278 Other lack of coordination: Secondary | ICD-10-CM | POA: Diagnosis not present

## 2022-09-28 DIAGNOSIS — R0602 Shortness of breath: Secondary | ICD-10-CM | POA: Diagnosis not present

## 2022-09-28 DIAGNOSIS — Z9181 History of falling: Secondary | ICD-10-CM | POA: Diagnosis not present

## 2022-09-28 DIAGNOSIS — R498 Other voice and resonance disorders: Secondary | ICD-10-CM | POA: Diagnosis not present

## 2022-09-28 DIAGNOSIS — R41841 Cognitive communication deficit: Secondary | ICD-10-CM | POA: Diagnosis not present

## 2022-09-28 DIAGNOSIS — M6281 Muscle weakness (generalized): Secondary | ICD-10-CM | POA: Diagnosis not present

## 2022-09-28 DIAGNOSIS — J449 Chronic obstructive pulmonary disease, unspecified: Secondary | ICD-10-CM | POA: Diagnosis not present

## 2022-09-29 DIAGNOSIS — R2681 Unsteadiness on feet: Secondary | ICD-10-CM | POA: Diagnosis not present

## 2022-09-29 DIAGNOSIS — R278 Other lack of coordination: Secondary | ICD-10-CM | POA: Diagnosis not present

## 2022-09-29 DIAGNOSIS — M6281 Muscle weakness (generalized): Secondary | ICD-10-CM | POA: Diagnosis not present

## 2022-09-30 ENCOUNTER — Other Ambulatory Visit: Payer: Self-pay

## 2022-09-30 ENCOUNTER — Encounter (HOSPITAL_COMMUNITY): Payer: Self-pay | Admitting: *Deleted

## 2022-09-30 ENCOUNTER — Emergency Department (HOSPITAL_COMMUNITY)
Admission: EM | Admit: 2022-09-30 | Discharge: 2022-09-30 | Disposition: A | Discharge status: Home / Self Care | Payer: Medicare PPO | Attending: Emergency Medicine | Admitting: Emergency Medicine

## 2022-09-30 DIAGNOSIS — N189 Chronic kidney disease, unspecified: Secondary | ICD-10-CM | POA: Diagnosis not present

## 2022-09-30 DIAGNOSIS — Z79899 Other long term (current) drug therapy: Secondary | ICD-10-CM | POA: Insufficient documentation

## 2022-09-30 DIAGNOSIS — I129 Hypertensive chronic kidney disease with stage 1 through stage 4 chronic kidney disease, or unspecified chronic kidney disease: Secondary | ICD-10-CM | POA: Insufficient documentation

## 2022-09-30 DIAGNOSIS — I10 Essential (primary) hypertension: Secondary | ICD-10-CM

## 2022-09-30 DIAGNOSIS — Z7901 Long term (current) use of anticoagulants: Secondary | ICD-10-CM | POA: Insufficient documentation

## 2022-09-30 LAB — CBC WITH DIFFERENTIAL/PLATELET
Abs Immature Granulocytes: 0.03 10*3/uL (ref 0.00–0.07)
Basophils Absolute: 0.2 10*3/uL — ABNORMAL HIGH (ref 0.0–0.1)
Basophils Relative: 2 %
Eosinophils Absolute: 0.3 10*3/uL (ref 0.0–0.5)
Eosinophils Relative: 3 %
HCT: 32.9 % — ABNORMAL LOW (ref 36.0–46.0)
Hemoglobin: 10.4 g/dL — ABNORMAL LOW (ref 12.0–15.0)
Immature Granulocytes: 0 %
Lymphocytes Relative: 15 %
Lymphs Abs: 1.5 10*3/uL (ref 0.7–4.0)
MCH: 31 pg (ref 26.0–34.0)
MCHC: 31.6 g/dL (ref 30.0–36.0)
MCV: 97.9 fL (ref 80.0–100.0)
Monocytes Absolute: 1.5 10*3/uL — ABNORMAL HIGH (ref 0.1–1.0)
Monocytes Relative: 16 %
Neutro Abs: 6.3 10*3/uL (ref 1.7–7.7)
Neutrophils Relative %: 64 %
Platelets: 343 10*3/uL (ref 150–400)
RBC: 3.36 MIL/uL — ABNORMAL LOW (ref 3.87–5.11)
RDW: 16.1 % — ABNORMAL HIGH (ref 11.5–15.5)
WBC: 9.8 10*3/uL (ref 4.0–10.5)
nRBC: 0.3 % — ABNORMAL HIGH (ref 0.0–0.2)

## 2022-09-30 LAB — COMPREHENSIVE METABOLIC PANEL
ALT: 16 U/L (ref 0–44)
AST: 22 U/L (ref 15–41)
Albumin: 3.3 g/dL — ABNORMAL LOW (ref 3.5–5.0)
Alkaline Phosphatase: 80 U/L (ref 38–126)
Anion gap: 13 (ref 5–15)
BUN: 61 mg/dL — ABNORMAL HIGH (ref 8–23)
CO2: 21 mmol/L — ABNORMAL LOW (ref 22–32)
Calcium: 9.9 mg/dL (ref 8.9–10.3)
Chloride: 100 mmol/L (ref 98–111)
Creatinine, Ser: 3.3 mg/dL — ABNORMAL HIGH (ref 0.44–1.00)
GFR, Estimated: 13 mL/min — ABNORMAL LOW (ref >60)
Glucose, Bld: 111 mg/dL — ABNORMAL HIGH (ref 70–99)
Potassium: 4.8 mmol/L (ref 3.5–5.1)
Sodium: 134 mmol/L — ABNORMAL LOW (ref 135–145)
Total Bilirubin: 0.7 mg/dL (ref 0.3–1.2)
Total Protein: 6.6 g/dL (ref 6.5–8.1)

## 2022-09-30 MED ORDER — CLONIDINE HCL 0.2 MG PO TABS
0.2000 mg | ORAL_TABLET | Freq: Once | ORAL | Status: AC
  Administered 2022-09-30: 0.2 mg via ORAL
  Filled 2022-09-30: qty 1

## 2022-09-30 MED ORDER — CLONIDINE HCL 0.2 MG PO TABS: 0.2000 mg | ORAL_TABLET | Freq: Three times a day (TID) | ORAL | 0 refills | Status: DC | PRN

## 2022-09-30 NOTE — Discharge Instructions (Addendum)
Your blood pressure was high in the emergency room today.  There are many potential reasons for this.  You should contact your primary care doctor's office to discuss your blood pressure management and medications. Please continue taking your normal medications.    I did prescribe the new medicine called clonidine, which is used "as needed" for high blood pressure.  When you wake up in the morning please check your blood pressure and keep a daily diary of your blood pressures.  You should check your blood pressure at least twice a day.  If your systolic blood pressure, the top number, is over 200 mmhg, you can take a clonidine tablet.  You can take the clonidine tablets once every 8 hours "as needed".  Remember, anytime you get a high blood pressure reading, try to wait at least 5 minutes calmly in a quiet room, with your feet resting squarely on the floor, and repeat a blood pressure.  Make sure the blood pressure cuff is not wrapped around your elbow.  This is to make sure there is not an error with your first reading.

## 2022-09-30 NOTE — ED Notes (Signed)
Patient verbalizes understanding of discharge instructions. Opportunity for questioning and answers were provided. Armband removed by staff, pt discharged from ED. Hypertension education provided. Pt ambulatory to ED waiting room with steady gait.

## 2022-09-30 NOTE — ED Triage Notes (Signed)
PT states she has been under a lot of stress and has been feeling tense, so she took her bp at home and it was 213/111.  She had already taken her regular morning bp med, but she took another 3.125 of carvedilol around 12:30 and then again around 4:30 pm with no reduction in the bp.  PT denies headaches, nausea, blurred vision or weakness.

## 2022-09-30 NOTE — ED Provider Notes (Signed)
EMERGENCY DEPARTMENT AT [facility] Provider Note   CSN: [id number] Arrival date & time: 09/30/22  1824     History  Chief Complaint  Patient presents with   Hypertension    [name] is a 82 y.o. 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  Patient ports noted her blood pressure was higher than normal today, over 200 systolic.  She does not know what her typical blood pressures are under cannot tell me what her average blood pressure is, other than say "it goes up and down a lot".  She denies any headaches, blurred vision, chest pain, dizziness, lightheadedness.  She takes carvedilol 3.125 mg twice daily as her only blood pressure medication, and she took these medicines regularly, including an extra dose, which did not help her blood pressure.  She says she was taken off of prior blood pressure medicines because "my blood pressure would run too low".  She is here with her husband at bedside.  In hospital in March 2024 for urosepsis, BP 168/70 at the time of discharge.  The patient denies to me she is having any dysuria, weakness or confusion, which her symptoms and her prior presentation, and her husband agrees with this.  HPI     Home Medications Prior to Admission medications   Medication Sig Start Date End Date Taking? Authorizing Provider  acetaminophen (TYLENOL) 325 MG tablet Take 2 tablets (650 mg total) by mouth 2 (two) times daily as needed (generalized pain). 08/24/22  Yes Mast, Man X, NP  apixaban (ELIQUIS) 2.5 MG TABS tablet Take 1 tablet (2.5 mg total) by mouth 2 (two) times daily. 08/24/22  Yes Mast, Man X, NP  ascorbic acid (VITAMIN C) 500 MG tablet Take 1 tablet (500 mg total) by mouth daily. 03/27/22  Yes Mast, Man X, NP  atorvastatin (LIPITOR) 80 MG tablet Take 1 tablet (80 mg total) by mouth daily. 08/24/22  Yes Mast, Man X,  NP  carvedilol (COREG) 3.125 MG tablet Take 1 tablet (3.125 mg total) by mouth 2 (two) times daily with a meal. 08/24/22  Yes Mast, Man X, NP  Cholecalciferol (VITAMIN D3) 50 MCG (2000 UT) capsule Take 1 capsule (2,000 Units total) by mouth daily. 03/27/22  Yes Mast, Man X, NP  cloNIDine (CATAPRES) 0.2 MG tablet Take 1 tablet (0.2 mg total) by mouth 3 (three) times daily as needed for up to 30 doses. For systolic blood pressure (top number) over 200 mmhg 09/30/22  Yes Saed Hudlow, Kermit Balo, MD  clotrimazole-betamethasone (LOTRISONE) cream Apply 1 Application topically 2 (two) times daily. Mouth corner rash 09/05/22 09/05/23 Yes Plotnikov, Georgina Quint, MD  Cyanocobalamin 2500 MCG TABS Take 2,500 mcg by mouth daily. 08/24/22  Yes Mast, Man X, NP  leflunomide (ARAVA) 20 MG tablet Take 1 tablet (20 mg total) by mouth daily. Patient taking differently: Take 10 mg by mouth daily. 08/24/22  Yes Mast, Man X, NP  levothyroxine (SYNTHROID) 88 MCG tablet Take 1 tablet (88 mcg total) by mouth daily before breakfast. 08/24/22  Yes Mast, Man X, NP  Multiple Vitamins-Minerals (CENTRUM ADULT PO) Take 1 tablet by mouth daily. 1 Tablet Daily.   Yes [provider]  pantoprazole (PROTONIX) 40 MG tablet Take 1 tablet (40 mg total) by mouth daily. 08/24/22  Yes Mast, Man X, NP  triamcinolone (NASACORT) 55 MCG/ACT AERO nasal inhaler Place 1 spray into the nose daily.  08/24/22  Yes Mast, Man X, NP  albuterol (VENTOLIN HFA) 108 (90 Base) MCG/ACT inhaler Inhale 1-2 puffs into the lungs every 4 (four) hours as needed for wheezing or shortness of breath. 03/27/22   Mast, Man X, NP  docusate sodium (COLACE) 100 MG capsule Take 1-2 capsules (100-200 mg total) by mouth daily as needed for mild constipation. 08/24/22   Mast, Man X, NP  furosemide (LASIX) 40 MG tablet Take 20 mg by mouth daily. MWF Patient not taking: Reported on 09/26/2022    [provider]  Golimumab 50 MG/0.5ML SOAJ Take 50 mg by mouth every 2 (two) months. Hold  for the next 2 weeks per Orthopedic surgery recommendations. 03/27/22   Mast, Man X, NP  polyethylene glycol (MIRALAX / GLYCOLAX) 17 g packet Take 17 g by mouth daily as needed. 08/24/22   Mast, Man X, NP      Allergies    Nsaids, Aspirin, Codeine, and Nitrostat [nitroglycerin]    Review of Systems   Review of Systems  Physical Exam Updated Vital Signs BP (!) 184/94   Pulse 85   Temp 97.7 F (36.5 C) (Oral)   Resp 20   Ht 5\' 3"  (1.6 m)   Wt 60.2 kg   SpO2 98%   BMI 23.52 kg/m  Physical Exam Constitutional:      General: She is not in acute distress. HENT:     Head: Normocephalic and atraumatic.  Eyes:     Conjunctiva/sclera: Conjunctivae normal.     Pupils: Pupils are equal, round, and reactive to light.  Cardiovascular:     Rate and Rhythm: Normal rate and regular rhythm.  Pulmonary:     Effort: Pulmonary effort is normal. No respiratory distress.  Abdominal:     General: There is no distension.     Tenderness: There is no abdominal tenderness.  Skin:    General: Skin is warm and dry.  Neurological:     General: No focal deficit present.     Mental Status: She is alert. Mental status is at baseline.  Psychiatric:        Mood and Affect: Mood normal.        Behavior: Behavior normal.     ED Results / Procedures / Treatments   Labs (all labs ordered are listed, but only abnormal results are displayed) Labs Reviewed  CBC WITH DIFFERENTIAL/PLATELET - Abnormal; Notable for the following components:      Result Value   RBC 3.36 (*)    Hemoglobin 10.4 (*)    HCT 32.9 (*)    RDW 16.1 (*)    nRBC 0.3 (*)    Monocytes Absolute 1.5 (*)    Basophils Absolute 0.2 (*)    All other components within normal limits  COMPREHENSIVE METABOLIC PANEL - Abnormal; Notable for the following components:   Sodium 134 (*)    CO2 21 (*)    Glucose, Bld 111 (*)    BUN 61 (*)    Creatinine, Ser 3.30 (*)    Albumin 3.3 (*)    GFR, Estimated 13 (*)    All other components within  normal limits    EKG EKG Interpretation  Date/Time:  Saturday September 30 2022 18:16:24 EDT Ventricular Rate:  90 PR Interval:  216 QRS Duration: 74 QT Interval:  352 QTC Calculation: 430 R Axis:   88 Text Interpretation: Sinus rhythm with 1st degree A-V block Nonspecific ST abnormality Abnormal ECG Confirmed by Alvester Chou [phone]) on 09/30/2022 9:44:11 PM  Radiology No results found.  Procedures Procedures    Medications Ordered in ED Medications  cloNIDine (CATAPRES) tablet 0.2 mg (0.2 mg Oral Given 09/30/22 2145)    ED Course/ Medical Decision Making/ A&P Clinical Course as of 09/30/22 2310  Sat Sep 30, 2022  2232 Patient is reassessed, no chest pain, no headache, no blurred vision.  At this point asymptomatic hypertension, okay for discharge, per current clinical guidelines [MT]    Clinical Course User Index [MT] Lianette Broussard, Kermit Balo, MD                             Medical Decision Making Amount and/or Complexity of Data Reviewed Labs: ordered.  Risk Prescription drug management.   Patient is presenting to the ED with asymptomatic hypertension.    No evidence of ACS, stroke.  No indication for neuroimaging at this time.  No signs or symptoms of sepsis or infection.  he has chronic kidney disease, reviewed her prior records including recent hospitalization stay, and her creatinine today is near her baseline level at 3.3.  Supplemental history is provided by the patient's husband.  Her EKG per my interpretation shows a sinus rhythm with some baseline artifact in the lateral leads but no acute ischemic findings.  I also reviewed her telemetry which showed a sinus rhythm with heart rate of approximately 90 bpm.  Repeat blood pressures were elevated in the ED.  After several discussions the patient and her husband are potential causes of hypertension, which could include stress, which she is under a lot of at the moment, as well as pain, dietary changes, and simply age  labile related blood pressure changes, we have discussed the rescue medication with clonidine, until she can see her PCP in the office this week.  I explained this is not a long-term medication but can be effective for rapid use, but only if she is monitoring her blood pressure.  We discussed the guidelines for how to monitor her blood pressure at home and keep a daily diary.  She verbalized understanding        Final Clinical Impression(s) / ED Diagnoses Final diagnoses:  Hypertension, unspecified type    Rx / DC Orders ED Discharge Orders          Ordered    cloNIDine (CATAPRES) 0.2 MG tablet  3 times daily PRN        09/30/22 2232              Terald Sleeper, MD 09/30/22 2310

## 2022-10-02 ENCOUNTER — Ambulatory Visit: Payer: Medicare PPO | Admitting: Family Medicine

## 2022-10-02 ENCOUNTER — Encounter: Payer: Self-pay | Admitting: Family Medicine

## 2022-10-02 VITALS — BP 196/104 | HR 88 | Temp 97.6°F | Resp 20 | Ht 63.0 in | Wt 134.0 lb

## 2022-10-02 DIAGNOSIS — N184 Chronic kidney disease, stage 4 (severe): Secondary | ICD-10-CM | POA: Diagnosis not present

## 2022-10-02 DIAGNOSIS — F419 Anxiety disorder, unspecified: Secondary | ICD-10-CM

## 2022-10-02 DIAGNOSIS — I10 Essential (primary) hypertension: Secondary | ICD-10-CM

## 2022-10-02 MED ORDER — LOSARTAN POTASSIUM 50 MG PO TABS
50.0000 mg | ORAL_TABLET | Freq: Every day | ORAL | 0 refills | Status: DC
Start: 2022-10-02 — End: 2022-11-24

## 2022-10-02 MED ORDER — SERTRALINE HCL 25 MG PO TABS
25.0000 mg | ORAL_TABLET | Freq: Every day | ORAL | 2 refills | Status: DC
Start: 2022-10-02 — End: 2022-10-19

## 2022-10-02 NOTE — Patient Instructions (Signed)
May continue Coreg 6.25 mg twice daily. Start Losartan 50 mg once daily. Monitor your blood pressure no more than twice daily.  Start sertraline 25 mg once daily.

## 2022-10-02 NOTE — Progress Notes (Signed)
Assessment & Plan:  Essential hypertension Assessment & Plan: Uncontrolled. Restarted Losartan 50 mg QD. May continue Coreg 6.25 mg BID. Encouraged to continue monitoring blood pressure, no more than twice daily.   Orders: -     Losartan Potassium; Take 1 tablet (50 mg total) by mouth daily.  Dispense: 30 tablet; Refill: 0  Anxiety Assessment & Plan: Patient is very stressed out recently. She has CKD and states she is nearing the end of her life. She has been trying to get affairs in order personally, medically, and financially. Starting Zoloft 25 mg QD. Education provided on managing stress.   Orders: -     Sertraline HCl; Take 1 tablet (25 mg total) by mouth daily.  Dispense: 30 tablet; Refill: 2  CKD (chronic kidney disease) stage 4, GFR 15-29 ml/min (HCC) - baseline SCr 2.5. follows with Dr. Malen Gauze with [facility] Assessment & Plan: Patient reports she is nearing the end of her life and is planning accordingly.       Follow up plan: Return in about 1 week (around 10/09/2022) for HTN with PCP; me if he is unavailable .  Deliah Boston, MSN, APRN, FNP-C  Subjective:  HPI: [name] is a 82 y.o. female presenting on 10/02/2022 for Hypertension (Follow up on HTN, Went to the ER Sat for BP of 211/113./Clonidine to use PRN was given )  Patient is accompanied by her husband, who she is okay with being present.  Patient was seen in the emergency room on 09/30/2022 due to uncontrolled hypertension.  She was given clonidine 0.2 mg to take 3 times daily as needed for a systolic blood pressure >200.  She states she has not needed to take this.  She takes Coreg 3.125 mg twice daily and states she doubled her dose last night and this morning. She has been monitoring her blood pressure since she was in the emergency room; readings have been 158-196/85-113. She does admit to being under more stress, which she feels is contributing to her elevated blood pressure.      10/02/2022   11:37  AM  GAD 7 : Generalized Anxiety Score  Nervous, Anxious, on Edge 2  Control/stop worrying 3  Worry too much - different things 3  Trouble relaxing 3  Restless 3  Easily annoyed or irritable 1  Afraid - awful might happen 1  Total GAD 7 Score 16  Anxiety Difficulty Somewhat difficult     ROS: Negative unless specifically indicated above in HPI.   Relevant past medical history reviewed and updated as indicated.   Allergies and medications reviewed and updated.   Current Outpatient Medications:    acetaminophen (TYLENOL) 325 MG tablet, Take 2 tablets (650 mg total) by mouth 2 (two) times daily as needed (generalized pain)., Disp: 100 tablet, Rfl: 2   albuterol (VENTOLIN HFA) 108 (90 Base) MCG/ACT inhaler, Inhale 1-2 puffs into the lungs every 4 (four) hours as needed for wheezing or shortness of breath., Disp: 1 each, Rfl: 2   apixaban (ELIQUIS) 2.5 MG TABS tablet, Take 1 tablet (2.5 mg total) by mouth 2 (two) times daily., Disp: 180 tablet, Rfl: 1   ascorbic acid (VITAMIN C) 500 MG tablet, Take 1 tablet (500 mg total) by mouth daily., Disp: 30 tablet, Rfl: 2   atorvastatin (LIPITOR) 80 MG tablet, Take 1 tablet (80 mg total) by mouth daily., Disp: 90 tablet, Rfl: 1   carvedilol (COREG) 3.125 MG tablet, Take 1 tablet (3.125 mg total) by mouth 2 (  two) times daily with a meal., Disp: 180 tablet, Rfl: 1   Cholecalciferol (VITAMIN D3) 50 MCG (2000 UT) capsule, Take 1 capsule (2,000 Units total) by mouth daily., Disp: 100 capsule, Rfl: 3   clotrimazole-betamethasone (LOTRISONE) cream, Apply 1 Application topically 2 (two) times daily. Mouth corner rash, Disp: 30 g, Rfl: 3   Cyanocobalamin 2500 MCG TABS, Take 2,500 mcg by mouth daily., Disp: 30 tablet, Rfl: 3   Fexofenadine HCl (ALLERGY 24-HR PO), Take 1 tablet by mouth in the morning and at bedtime. OTC - 12 hour, Disp: , Rfl:    leflunomide (ARAVA) 20 MG tablet, Take 1 tablet (20 mg total) by mouth daily. (Patient taking differently: Take 10  mg by mouth daily.), Disp: 30 tablet, Rfl: 1   levothyroxine (SYNTHROID) 88 MCG tablet, Take 1 tablet (88 mcg total) by mouth daily before breakfast., Disp: 90 tablet, Rfl: 1   Multiple Vitamins-Minerals (CENTRUM ADULT PO), Take 1 tablet by mouth daily. 1 Tablet Daily., Disp: , Rfl:    pantoprazole (PROTONIX) 40 MG tablet, Take 1 tablet (40 mg total) by mouth daily., Disp: 180 tablet, Rfl: 1   polyethylene glycol (MIRALAX / GLYCOLAX) 17 g packet, Take 17 g by mouth daily as needed., Disp: 14 each, Rfl: 1   triamcinolone (NASACORT) 55 MCG/ACT AERO nasal inhaler, Place 1 spray into the nose daily., Disp: 1 each, Rfl: 3   cloNIDine (CATAPRES) 0.2 MG tablet, Take 1 tablet (0.2 mg total) by mouth 3 (three) times daily as needed for up to 30 doses. For systolic blood pressure (top number) over 200 mmhg (Patient not taking: Reported on 10/02/2022), Disp: 30 tablet, Rfl: 0   docusate sodium (COLACE) 100 MG capsule, Take 1-2 capsules (100-200 mg total) by mouth daily as needed for mild constipation. (Patient not taking: Reported on 10/02/2022), Disp: 100 capsule, Rfl: 1   Golimumab 50 MG/0.5ML SOAJ, Take 50 mg by mouth every 2 (two) months. Hold for the next 2 weeks per Orthopedic surgery recommendations. (Patient not taking: Reported on 10/02/2022), Disp: 0.3 mL, Rfl:   Allergies  Allergen Reactions   Nsaids Other (See Comments)    Stomach upset Told to avoid due to Eliquis   Aspirin Other (See Comments)    Stomach upset Told to avoid due to Eliquis   Codeine Nausea And Vomiting   Nitrostat [Nitroglycerin] Other (See Comments)    Bradycardia. Drop in heart rate     Objective:   BP (!) 196/104   Pulse 88   Temp 97.6 F (36.4 C)   Resp 20   Ht 5\' 3"  (1.6 m)   Wt 134 lb (60.8 kg)   BMI 23.74 kg/m    Physical Exam Vitals reviewed.  Constitutional:      General: She is not in acute distress.    Appearance: Normal appearance. She is not ill-appearing, toxic-appearing or diaphoretic.  HENT:      Head: Normocephalic and atraumatic.  Eyes:     General: No scleral icterus.       Right eye: No discharge.        Left eye: No discharge.     Conjunctiva/sclera: Conjunctivae normal.  Cardiovascular:     Rate and Rhythm: Normal rate.     Heart sounds: Normal heart sounds.  Pulmonary:     Effort: Pulmonary effort is normal. No respiratory distress.  Musculoskeletal:        General: Normal range of motion.     Cervical back: Normal range of motion.  Skin:  General: Skin is warm and dry.     Capillary Refill: Capillary refill takes less than 2 seconds.  Neurological:     General: No focal deficit present.     Mental Status: She is alert and oriented to person, place, and time. Mental status is at baseline.     Gait: Gait abnormal (ambulating with rolling walker).  Psychiatric:        Mood and Affect: Mood normal.        Behavior: Behavior normal.        Thought Content: Thought content normal.        Judgment: Judgment normal.

## 2022-10-02 NOTE — Assessment & Plan Note (Signed)
Uncontrolled. Restarted Losartan 50 mg QD. May continue Coreg 6.25 mg BID. Encouraged to continue monitoring blood pressure, no more than twice daily.

## 2022-10-02 NOTE — Assessment & Plan Note (Addendum)
Patient is very stressed out recently. She has CKD and states she is nearing the end of her life. She has been trying to get affairs in order personally, medically, and financially. Starting Zoloft 25 mg QD. Education provided on managing stress.

## 2022-10-02 NOTE — Assessment & Plan Note (Addendum)
Patient reports she is nearing the end of her life and is planning accordingly.

## 2022-10-04 ENCOUNTER — Encounter (HOSPITAL_COMMUNITY): Payer: Self-pay

## 2022-10-04 ENCOUNTER — Emergency Department (HOSPITAL_COMMUNITY)
Admission: EM | Admit: 2022-10-04 | Discharge: 2022-10-04 | Disposition: A | Discharge status: Home / Self Care | Payer: Medicare PPO | Attending: Emergency Medicine | Admitting: Emergency Medicine

## 2022-10-04 ENCOUNTER — Telehealth: Payer: Self-pay

## 2022-10-04 ENCOUNTER — Other Ambulatory Visit: Payer: Self-pay

## 2022-10-04 DIAGNOSIS — I160 Hypertensive urgency: Secondary | ICD-10-CM | POA: Diagnosis not present

## 2022-10-04 DIAGNOSIS — N184 Chronic kidney disease, stage 4 (severe): Secondary | ICD-10-CM | POA: Insufficient documentation

## 2022-10-04 DIAGNOSIS — E039 Hypothyroidism, unspecified: Secondary | ICD-10-CM | POA: Insufficient documentation

## 2022-10-04 DIAGNOSIS — Z79899 Other long term (current) drug therapy: Secondary | ICD-10-CM | POA: Diagnosis not present

## 2022-10-04 DIAGNOSIS — I129 Hypertensive chronic kidney disease with stage 1 through stage 4 chronic kidney disease, or unspecified chronic kidney disease: Secondary | ICD-10-CM | POA: Insufficient documentation

## 2022-10-04 DIAGNOSIS — Z79890 Hormone replacement therapy (postmenopausal): Secondary | ICD-10-CM | POA: Diagnosis not present

## 2022-10-04 DIAGNOSIS — R251 Tremor, unspecified: Secondary | ICD-10-CM | POA: Diagnosis present

## 2022-10-04 DIAGNOSIS — Z7901 Long term (current) use of anticoagulants: Secondary | ICD-10-CM | POA: Insufficient documentation

## 2022-10-04 LAB — COMPREHENSIVE METABOLIC PANEL
ALT: 18 U/L (ref 0–44)
AST: 25 U/L (ref 15–41)
Albumin: 3 g/dL — ABNORMAL LOW (ref 3.5–5.0)
Alkaline Phosphatase: 73 U/L (ref 38–126)
Anion gap: 10 (ref 5–15)
BUN: 49 mg/dL — ABNORMAL HIGH (ref 8–23)
CO2: 22 mmol/L (ref 22–32)
Calcium: 9.4 mg/dL (ref 8.9–10.3)
Chloride: 101 mmol/L (ref 98–111)
Creatinine, Ser: 3.11 mg/dL — ABNORMAL HIGH (ref 0.44–1.00)
GFR, Estimated: 14 mL/min — ABNORMAL LOW (ref >60)
Glucose, Bld: 124 mg/dL — ABNORMAL HIGH (ref 70–99)
Potassium: 5.3 mmol/L — ABNORMAL HIGH (ref 3.5–5.1)
Sodium: 133 mmol/L — ABNORMAL LOW (ref 135–145)
Total Bilirubin: 0.5 mg/dL (ref 0.3–1.2)
Total Protein: 5.8 g/dL — ABNORMAL LOW (ref 6.5–8.1)

## 2022-10-04 NOTE — ED Triage Notes (Addendum)
Pt arrives via POV. Pt reports he BP has been really high this morning at home despite taking her BP meds. Recently seen for the same on the 1st. Pt denies any associated symptoms in regards to her BP. BP in triage is 204/74. Pt does report an episode of diarrhea yesterday, resolved after taking imodium. Pt AxOx4.

## 2022-10-04 NOTE — Patient Outreach (Signed)
  Care Coordination   Care Coordination  Visit Note   10/05/2022 Name: [name] MRN: [id number] DOB: [birth year]  [name] is a 81 y.o. year old female who sees Plotnikov, Georgina Quint, MD for primary care. I  spoke with patient's son [name].  RNCM returned call to patient's son, [name]) who wanted to discuss some questions/concerns regarding [name]. He states they have an  upcoming trip planned to the [place]. [name]  wants to be sure patient has access to oxygen while she is at [employer]. He is not sure who the oxygen supplier; reports patient needs appointment rescheduled for retocrit and cardiology due to she missed appointments while in the hospital March 2024; questioned if assistance available for pill box refill; [name] discussed patient is not receiving the modified meals requested by patient at [facility] independent living [name] to contact independent living to discuss); also reports patient's mood has been a little down lately "with everything going on". Patient last seen by provider on 10/02/22. [name] to follow up 10/09/22 for BP check. [name] states he is trying to participate in as many appointments with patient that he can. Per review of chart, [name] is currently in the ED. RNCM will follow up with patient post discharge.   Goals Addressed             This Visit's Progress    Assist with maintaining health-continue to improve post rehab stay       Interventions Today    Flowsheet Row Most Recent Value  Chronic Disease   Chronic disease during today's visit Hypertension (HTN)  General Interventions   General Interventions Discussed/Reviewed General Interventions Reviewed  [Care Coordination with patients son regarding patient care needs]            SDOH assessments and interventions completed:  No  Care Coordination Interventions:  Yes, provided   Follow up plan:  RNCM will follow up post discharge    Encounter Outcome:  Pt. Visit Completed    Kathyrn Sheriff, RN, MSN, BSN, CCM [facility] Care Coordinator [phone]

## 2022-10-04 NOTE — ED Provider Notes (Signed)
[place] EMERGENCY DEPARTMENT AT [facility] Provider Note   CSN: [id number] Arrival date & time: 10/04/22  1127     History  Hypertension Hyperlipidemia Rheumatoid arthritis Osteoporosis Chronic obstructive pulmonary disease Chronic kidney disease IV Abdominal aortic aneurysm post repair Mesenteric artery stenosis Hypothyroidism Anxiety   Chief Complaint  Patient presents with   Hypertension    [name] is a 82 y.o. female with a past medical history of hypertension, hyperlipidemia, RA, COPD, CKD IV, AAA post repair, hypothyroidism, and anxiety who presents with acutely worsened anxiety and tremor.  History notable for hypertension managed at home with carvedilol. Developed similar anxiety-tremor symptoms on 6-1 and visited the ED where her blood pressure was elevated to 184/94. Discharged with clonidine to take as needed and she was evaluated by primary care provider on 6-3. At that time, she was restarted on losartan and carvedilol dose increased. Throughout the past few days, her home blood pressure readings have fluctuated between 140s and 180s systolic. She has been taking her carvedilol twice and losartan once daily as prescribed. Denies headache, nausea, vomiting, numbness, tingling, blurry vision, breath shortness, and chest or abdominal pain. Reports self-discontinuing torsemide about two weeks ago, but extent of mild lower extremity swelling has remained the same. Of note, patient states that she has been experiencing a lot of psychological stress recently due to multiple chronic medical conditions and financial struggles.     Hypertension       Home Medications Prior to Admission medications   Medication Sig Start Date End Date Taking? Authorizing Provider  acetaminophen (TYLENOL) 325 MG tablet Take 2 tablets (650 mg total) by mouth 2 (two) times daily as needed (generalized pain). 08/24/22   Mast, Man X, NP  albuterol (VENTOLIN HFA) 108 (90  Base) MCG/ACT inhaler Inhale 1-2 puffs into the lungs every 4 (four) hours as needed for wheezing or shortness of breath. 03/27/22   Mast, Man X, NP  apixaban (ELIQUIS) 2.5 MG TABS tablet Take 1 tablet (2.5 mg total) by mouth 2 (two) times daily. 08/24/22   Mast, Man X, NP  ascorbic acid (VITAMIN C) 500 MG tablet Take 1 tablet (500 mg total) by mouth daily. 03/27/22   Mast, Man X, NP  atorvastatin (LIPITOR) 80 MG tablet Take 1 tablet (80 mg total) by mouth daily. 08/24/22   Mast, Man X, NP  carvedilol (COREG) 3.125 MG tablet Take 1 tablet (3.125 mg total) by mouth 2 (two) times daily with a meal. 08/24/22   Mast, Man X, NP  Cholecalciferol (VITAMIN D3) 50 MCG (2000 UT) capsule Take 1 capsule (2,000 Units total) by mouth daily. 03/27/22   Mast, Man X, NP  cloNIDine (CATAPRES) 0.2 MG tablet Take 1 tablet (0.2 mg total) by mouth 3 (three) times daily as needed for up to 30 doses. For systolic blood pressure (top number) over 200 mmhg Patient not taking: Reported on 10/02/2022 09/30/22   Terald Sleeper, MD  clotrimazole-betamethasone (LOTRISONE) cream Apply 1 Application topically 2 (two) times daily. Mouth corner rash 09/05/22 09/05/23  Plotnikov, Georgina Quint, MD  Cyanocobalamin 2500 MCG TABS Take 2,500 mcg by mouth daily. 08/24/22   Mast, Man X, NP  docusate sodium (COLACE) 100 MG capsule Take 1-2 capsules (100-200 mg total) by mouth daily as needed for mild constipation. Patient not taking: Reported on 10/02/2022 08/24/22   Mast, Man X, NP  Fexofenadine HCl (ALLERGY 24-HR PO) Take 1 tablet by mouth in the morning and at bedtime. OTC - 12  hour    [provider]  Golimumab 50 MG/0.5ML SOAJ Take 50 mg by mouth every 2 (two) months. Hold for the next 2 weeks per Orthopedic surgery recommendations. Patient not taking: Reported on 10/02/2022 03/27/22   Mast, Man X, NP  leflunomide (ARAVA) 20 MG tablet Take 1 tablet (20 mg total) by mouth daily. Patient taking differently: Take 10 mg by mouth daily. 08/24/22    Mast, Man X, NP  levothyroxine (SYNTHROID) 88 MCG tablet Take 1 tablet (88 mcg total) by mouth daily before breakfast. 08/24/22   Mast, Man X, NP  losartan (COZAAR) 50 MG tablet Take 1 tablet (50 mg total) by mouth daily. 10/02/22   Gwenlyn Fudge, FNP  Multiple Vitamins-Minerals (CENTRUM ADULT PO) Take 1 tablet by mouth daily. 1 Tablet Daily.    [provider]  pantoprazole (PROTONIX) 40 MG tablet Take 1 tablet (40 mg total) by mouth daily. 08/24/22   Mast, Man X, NP  polyethylene glycol (MIRALAX / GLYCOLAX) 17 g packet Take 17 g by mouth daily as needed. 08/24/22   Mast, Man X, NP  sertraline (ZOLOFT) 25 MG tablet Take 1 tablet (25 mg total) by mouth daily. 10/02/22   Gwenlyn Fudge, FNP  triamcinolone (NASACORT) 55 MCG/ACT AERO nasal inhaler Place 1 spray into the nose daily. 08/24/22   Mast, Man X, NP      Allergies    Nsaids, Aspirin, Codeine, and Nitrostat [nitroglycerin]    Review of Systems   Review of Systems  Anxiety, tremor   Physical Exam Updated Vital Signs BP (!) 175/69   Pulse 78   Temp 97.7 F (36.5 C)   Resp 18   Ht 5\' 3"  (1.6 m)   Wt 60.8 kg   SpO2 97%   BMI 23.74 kg/m  Physical Exam  Awake and alert, fully oriented, calm and cooperative, not in acute distress Regular heart rate and rhythm, normal S1 and S2, no murmurs Capillary refill about 1-2 seconds, strong peripheral pulses, 1+ pitting edema BLE Normal respiratory effort, lungs clear to auscultation, no wheezing or crackles Abdomen soft and non-distended, hyperactive sounds, no tenderness or rigidity Pupils equal and reactive, cranial nerves II-XII intact, no focal deficits Sensation across all extremities intact and symmetrical, strength 5/5 in BUE BLE   ED Results / Procedures / Treatments   Labs (all labs ordered are listed, but only abnormal results are displayed) Labs Reviewed  COMPREHENSIVE METABOLIC PANEL - Abnormal; Notable for the following components:      Result Value   Sodium  133 (*)    Potassium 5.3 (*)    Glucose, Bld 124 (*)    BUN 49 (*)    Creatinine, Ser 3.11 (*)    Total Protein 5.8 (*)    Albumin 3.0 (*)    GFR, Estimated 14 (*)    All other components within normal limits    EKG EKG Interpretation  Date/Time:  Wednesday October 04 2022 13:09:09 EDT Ventricular Rate:  76 PR Interval:  204 QRS Duration: 80 QT Interval:  382 QTC Calculation: 430 R Axis:   91 Text Interpretation: Sinus rhythm Right axis deviation Nonspecific T abnrm, anterolateral leads No significant change since last tracing Confirmed by Gwyneth Sprout ([id number]) on 10/04/2022 1:22:50 PM  Radiology No results found.  Procedures Procedures   None   Medications Ordered in ED Medications - No data to display  ED Course/ Medical Decision Making/ A&P   {   Click here for ABCD2, HEART and  other calculators                         Medical Decision Making Amount and/or Complexity of Data Reviewed Labs: ordered. ECG/medicine tests: ordered.   Patient presents with anxiety-tremors and elevated home blood pressure reading, upon arrival BP 204/74. History notable for recent ED visit for the same symptoms, after which her home medications were adjusted by primary care provider. Carvedilol dose was doubled and losartan restarted. Since that visit, the patient has been taking her blood pressure medications as prescribed and reports home values in 140-180s range. Electrocardiogram unremarkable. Laboratory testing consistent with CKD IV and mild hyperkalemia. Exam notable only for baseline pitting edema in bilateral lower extremities. Zero signs of end-organ damage. Etiology of hypertension most likely psychological stressors and underlying kidney disease. Blood pressure decreased to 170s/60s and anxiety-tremor symptoms have improved while in the ED. Discharged with instructions to continue current blood pressure medications, including clonidine as needed for systolic values greater than  200mmHg, and visit her primary care provider within the next 1-2 weeks. Encouraged to limit intake of foods and beverages with high-salt content.     Final Clinical Impression(s) / ED Diagnoses Final diagnoses:  Hypertensive urgency    Rx / DC Orders ED Discharge Orders     None         Crissie Sickles, MD 10/04/22 1447    Gwyneth Sprout, MD 10/05/22 [phone]

## 2022-10-04 NOTE — Discharge Instructions (Addendum)
[name],  It was a pleasure taking care of you while you were in the hospital. Your feeling of tension was caused by high blood pressure, likely from psychological stresses and underlying kidney disease.  While you were here, your blood pressure improved without any additional medications. After returning home, you should continue to take your blood pressure medications including losartan and carvedilol as prescribed. Since your home medication regimen was changed just a few days ago, it will take some more time for your blood pressure to normalize. We recommend scheduling an appointment with your primary care provider sometime within the next 1-2 weeks for a blood pressure recheck. In the meantime, please avoid foods and beverages that are high in salt. Continue to take clonidine as needed for systolic blood pressure values greater than .  Please return to the emergency department if you develop any concerning symptoms such as headache, blurry vision, or chest pain.

## 2022-10-05 DIAGNOSIS — R278 Other lack of coordination: Secondary | ICD-10-CM | POA: Diagnosis not present

## 2022-10-05 DIAGNOSIS — M6281 Muscle weakness (generalized): Secondary | ICD-10-CM | POA: Diagnosis not present

## 2022-10-05 DIAGNOSIS — Z9181 History of falling: Secondary | ICD-10-CM | POA: Diagnosis not present

## 2022-10-06 ENCOUNTER — Telehealth: Payer: Self-pay | Admitting: *Deleted

## 2022-10-06 DIAGNOSIS — R278 Other lack of coordination: Secondary | ICD-10-CM | POA: Diagnosis not present

## 2022-10-06 DIAGNOSIS — R2681 Unsteadiness on feet: Secondary | ICD-10-CM | POA: Diagnosis not present

## 2022-10-06 DIAGNOSIS — M6281 Muscle weakness (generalized): Secondary | ICD-10-CM | POA: Diagnosis not present

## 2022-10-06 NOTE — Progress Notes (Signed)
  Care Coordination Note  10/06/2022 Name: HALAINA VANDUZER MRN: [id number] DOB: [birth year]  DONATA REDDICK is a 82 y.o. year old female who is a primary care patient of Plotnikov, Georgina Quint, MD and is actively engaged with the care management team. I reached out to [name] by phone today to assist with scheduling an initial visit with the Licensed Clinical Social Worker  Follow up plan: Telephone appointment with care management team member scheduled for:10/10/22.  [facility]  Care Coordination Care Guide  Direct Dial: [phone]

## 2022-10-09 ENCOUNTER — Encounter: Payer: Self-pay | Admitting: Family Medicine

## 2022-10-09 ENCOUNTER — Ambulatory Visit: Payer: Medicare PPO | Admitting: Family Medicine

## 2022-10-09 VITALS — BP 158/78 | HR 81 | Temp 97.6°F | Resp 20 | Ht 63.0 in | Wt 134.0 lb

## 2022-10-09 DIAGNOSIS — I10 Essential (primary) hypertension: Secondary | ICD-10-CM | POA: Diagnosis not present

## 2022-10-09 DIAGNOSIS — G4730 Sleep apnea, unspecified: Secondary | ICD-10-CM | POA: Diagnosis not present

## 2022-10-09 DIAGNOSIS — F419 Anxiety disorder, unspecified: Secondary | ICD-10-CM

## 2022-10-09 DIAGNOSIS — N184 Chronic kidney disease, stage 4 (severe): Secondary | ICD-10-CM

## 2022-10-09 NOTE — Assessment & Plan Note (Addendum)
Improving, but unsure of control.  Patient's blood pressure is good for [place] here today.  She has been getting readings that are uncontrolled at home, however when we compared her blood pressure cuff to ours for accuracy, it is not accurate.  Discussed I am not willing to make a change to her blood pressure medication given her good reading today and inaccuracy of her cuff.  Recommended she not check her blood pressure until one hour after she has had her medication.  Encouraged to keep a blood pressure log that she can bring back with her to her next appointment.  She is going to purchase a new blood pressure cuff that fits appropriately.

## 2022-10-09 NOTE — Assessment & Plan Note (Signed)
Too soon to see if medication is helping, but it is not worsening.

## 2022-10-09 NOTE — Progress Notes (Signed)
Assessment & Plan:  Essential hypertension Assessment & Plan: Improving, but unsure of control.  Patient's blood pressure is good for [place] here today.  She has been getting readings that are uncontrolled at home, however when we compared her blood pressure cuff to ours for accuracy, it is not accurate.  Discussed I am not willing to make a change to her blood pressure medication given her good reading today and inaccuracy of her cuff.  Recommended she not check her blood pressure until one hour after she has had her medication.  Encouraged to keep a blood pressure log that she can bring back with her to her next appointment.  She is going to purchase a new blood pressure cuff that fits appropriately.  Orders: -     Basic metabolic panel -     VAS US RENAL ARTERY DUPLEX; Future  CKD (chronic kidney disease) stage 4, GFR 15-29 ml/min (HCC) - baseline SCr 2.5. follows with Dr. Malen Gauze with [facility] Assessment & Plan: Assessing for renal artery stenosis due to resistant hypertension and CKD.  Orders: -     Basic metabolic panel -     VAS US RENAL ARTERY DUPLEX; Future  Anxiety Assessment & Plan: Too soon to see if medication is helping, but it is not worsening.  Orders: -     Basic metabolic panel     Return in about 9 days (around 10/18/2022) for HTN with PCP.  Deliah Boston, MSN, APRN, FNP-C  Subjective:      HPI: [name] is a 82 y.o. female presenting on 10/09/2022 for Hypertension  Patient is accompanied by her husband, who she is okay with being present; her son is on the phone.  Hypertension: Patient was seen last week at which time she was restarted on losartan 50 mg once daily.  She was also advised to continue increased dose of Coreg at 6.25 mg twice daily.  She did go to the ER 2 days after being here for hypertensive urgency, but they did not make any changes to her regimen since it had only been 2 days and she has clonidine to take as needed for  systolic > 200.  She has been monitoring her blood pressure at home and reports readings 180-190/90-100.  She did bring her cuff with her today to compare for accuracy as it keeps giving her error messages.  Anxiety: Patient was started on sertraline 25 mg once daily last week.     10/09/2022    9:10 AM 10/02/2022   11:37 AM  GAD 7 : Generalized Anxiety Score  Nervous, Anxious, on Edge 2 2  Control/stop worrying 2 3  Worry too much - different things 2 3  Trouble relaxing 2 3  Restless 2 3  Easily annoyed or irritable 0 1  Afraid - awful might happen 2 1  Total GAD 7 Score 12 16  Anxiety Difficulty Somewhat difficult Somewhat difficult    New complaints: None   Social history:  Relevant past medical, surgical, family and social history reviewed and updated as indicated. Interim medical history since our last visit reviewed.  Allergies and medications reviewed and updated.  DATA REVIEWED: CHART IN EPIC  ROS: Negative unless specifically indicated above in HPI.    Current Outpatient Medications:    acetaminophen (TYLENOL) 325 MG tablet, Take 2 tablets (650 mg total) by mouth 2 (two) times daily as needed (generalized pain)., Disp: 100 tablet, Rfl: 2   albuterol (VENTOLIN HFA) 108 (90 Base)  MCG/ACT inhaler, Inhale 1-2 puffs into the lungs every 4 (four) hours as needed for wheezing or shortness of breath., Disp: 1 each, Rfl: 2   apixaban (ELIQUIS) 2.5 MG TABS tablet, Take 1 tablet (2.5 mg total) by mouth 2 (two) times daily., Disp: 180 tablet, Rfl: 1   ascorbic acid (VITAMIN C) 500 MG tablet, Take 1 tablet (500 mg total) by mouth daily., Disp: 30 tablet, Rfl: 2   atorvastatin (LIPITOR) 80 MG tablet, Take 1 tablet (80 mg total) by mouth daily., Disp: 90 tablet, Rfl: 1   carvedilol (COREG) 6.25 MG tablet, Take 6.25 mg by mouth 2 (two) times daily with a meal., Disp: , Rfl:    Cholecalciferol (VITAMIN D3) 50 MCG (2000 UT) capsule, Take 1 capsule (2,000 Units total) by mouth daily.,  Disp: 100 capsule, Rfl: 3   cloNIDine (CATAPRES) 0.2 MG tablet, Take 1 tablet (0.2 mg total) by mouth 3 (three) times daily as needed for up to 30 doses. For systolic blood pressure (top number) over 200 mmhg, Disp: 30 tablet, Rfl: 0   clotrimazole-betamethasone (LOTRISONE) cream, Apply 1 Application topically 2 (two) times daily. Mouth corner rash, Disp: 30 g, Rfl: 3   Cyanocobalamin 2500 MCG TABS, Take 2,500 mcg by mouth daily., Disp: 30 tablet, Rfl: 3   docusate sodium (COLACE) 100 MG capsule, Take 1-2 capsules (100-200 mg total) by mouth daily as needed for mild constipation., Disp: 100 capsule, Rfl: 1   Fexofenadine HCl (ALLERGY 24-HR PO), Take 1 tablet by mouth in the morning and at bedtime. OTC - 12 hour, Disp: , Rfl:    Golimumab 50 MG/0.5ML SOAJ, Take 50 mg by mouth every 2 (two) months. Hold for the next 2 weeks per Orthopedic surgery recommendations., Disp: 0.3 mL, Rfl:    leflunomide (ARAVA) 20 MG tablet, Take 1 tablet (20 mg total) by mouth daily. (Patient taking differently: Take 10 mg by mouth daily.), Disp: 30 tablet, Rfl: 1   levothyroxine (SYNTHROID) 88 MCG tablet, Take 1 tablet (88 mcg total) by mouth daily before breakfast., Disp: 90 tablet, Rfl: 1   losartan (COZAAR) 50 MG tablet, Take 1 tablet (50 mg total) by mouth daily., Disp: 30 tablet, Rfl: 0   Multiple Vitamins-Minerals (CENTRUM ADULT PO), Take 1 tablet by mouth daily. 1 Tablet Daily., Disp: , Rfl:    pantoprazole (PROTONIX) 40 MG tablet, Take 1 tablet (40 mg total) by mouth daily., Disp: 180 tablet, Rfl: 1   polyethylene glycol (MIRALAX / GLYCOLAX) 17 g packet, Take 17 g by mouth daily as needed., Disp: 14 each, Rfl: 1   sertraline (ZOLOFT) 25 MG tablet, Take 1 tablet (25 mg total) by mouth daily., Disp: 30 tablet, Rfl: 2   triamcinolone (NASACORT) 55 MCG/ACT AERO nasal inhaler, Place 1 spray into the nose daily., Disp: 1 each, Rfl: 3      Objective:    BP (!) 158/78 Comment: patients BP monitor - left upper arm   Pulse 81   Temp 97.6 F (36.4 C)   Resp 20   Ht 5\' 3"  (1.6 m)   Wt 134 lb (60.8 kg)   BMI 23.74 kg/m   Wt Readings from Last 3 Encounters:  10/09/22 134 lb (60.8 kg)  10/04/22 134 lb (60.8 kg)  10/02/22 134 lb (60.8 kg)    Physical Exam Vitals reviewed.  Constitutional:      General: She is not in acute distress.    Appearance: Normal appearance. She is not ill-appearing, toxic-appearing or diaphoretic.  HENT:  Head: Normocephalic and atraumatic.  Eyes:     General: No scleral icterus.       Right eye: No discharge.        Left eye: No discharge.     Conjunctiva/sclera: Conjunctivae normal.  Cardiovascular:     Rate and Rhythm: Normal rate.     Heart sounds: Normal heart sounds.  Pulmonary:     Effort: Pulmonary effort is normal. No respiratory distress.     Breath sounds: Normal breath sounds.  Musculoskeletal:        General: Normal range of motion.     Cervical back: Normal range of motion.  Skin:    General: Skin is warm and dry.     Capillary Refill: Capillary refill takes less than 2 seconds.  Neurological:     General: No focal deficit present.     Mental Status: She is alert and oriented to person, place, and time. Mental status is at baseline.     Gait: Gait abnormal (ambulating with rolling walker).  Psychiatric:        Mood and Affect: Mood normal.        Behavior: Behavior normal.        Thought Content: Thought content normal.        Judgment: Judgment normal.

## 2022-10-09 NOTE — Assessment & Plan Note (Signed)
Assessing for renal artery stenosis due to resistant hypertension and CKD.

## 2022-10-09 NOTE — Patient Instructions (Addendum)
Obtain new blood pressure cuff that fits appropriately as your current one is not accurate.   Wait one hour after taking blood pressure medication to check your blood pressure. Keep a log and bring it back with you, as well as your new cuff.   Upcoming Ultrasound to look for renal artery stenosis: No food after 11PM the night before.  Water is OK.   Take two Extra-Strength Gas-X capsules at bedtime the night before test.   Take an additional two Extra-Strength Gas-X capsules three (3) hours before the test or first thing in the morning.  Avoid foods that produce bowel gas, for 24 hours prior to exam (see below).  No breakfast, no chewing gum, no smoking or carbonated beverages.  Patient may take morning medications with water.  Come in for test at least 15 minutes early to register.

## 2022-10-10 ENCOUNTER — Ambulatory Visit: Payer: Self-pay | Admitting: Licensed Clinical Social Worker

## 2022-10-10 DIAGNOSIS — M6281 Muscle weakness (generalized): Secondary | ICD-10-CM | POA: Diagnosis not present

## 2022-10-10 DIAGNOSIS — Z9181 History of falling: Secondary | ICD-10-CM | POA: Diagnosis not present

## 2022-10-10 DIAGNOSIS — R278 Other lack of coordination: Secondary | ICD-10-CM | POA: Diagnosis not present

## 2022-10-10 NOTE — Patient Instructions (Signed)
Visit Information  Thank you for taking time to visit with me today. Please don't hesitate to contact me if I can be of assistance to you.   Following are the goals we discussed today:   Goals Addressed             This Visit's Progress    Patient Stated  she is concerned about legal issues and financial issues and ensuring that affairs are in order for her and her spouse       Interventions:  Spoke with [name] about her needs Discussed program support for client with RN, Pharmacist and LCSW Discussed medication procurement Discussed transport assist. Her spouse transports her to and from her medical appointments.  Her spouse drives her to complete errands in the community Discussed sleeping issues Discussed oxygen use.  Discussed mood of client. She said she has started recently taking Zoloft and is hoping it will be helpful with her mood. Discussed activities at facility where she resides.  Discussed family support. One son resides in [place], [place]. One son lives in [place]. Daughter resides in [place].One son lives in [place].  She communicates with her children weekly via phone Discussed walking of client. She uses a rolling walker to ambulate Client said she is concerned about getting affairs in order such as legal matters or financial matters. She has worked with an Pensions consultant in the past. She said she sometimes procrastinates or puts things off she needs to do.  Discussed support with PCP.  She has medical appointment with PCP next week. Discussed vision of client.  She spoke of having double vision sometimes. She no longer drives..  She wears glasses Provided counseling support Encouraged client to call LCSW as needed for SW support at [phone].           Our next appointment is by telephone on 11/28/22 at 2:30 PM   Please call the care guide team at [phone] if you need to cancel or reschedule your appointment.   If you are experiencing a Mental Health  or Behavioral Health Crisis or need someone to talk to, please go to [facility] [address], [place] [phone])   The patient verbalized understanding of instructions, educational materials, and care plan provided today and DECLINED offer to receive copy of patient instructions, educational materials, and care plan.   The patient has been provided with contact information for the care management team and has been advised to call with any health related questions or concerns.   Kelton Pillar.Adely Facer MSW, LCSW Licensed Visual merchandiser [facility] Care Management [phone]

## 2022-10-10 NOTE — Patient Outreach (Signed)
Care Coordination   Initial Visit Note   10/10/2022 Name: [name] MRN: [id number] DOB: Dec 03, 1940  [name] is a 82 y.o. year old female who sees Plotnikov, Georgina Quint, MD for primary care. I spoke with  [name] by phone today.  What matters to the patients health and wellness today? Patient Stated  she is concerned about legal issues and financial issues and ensuring that affairs are in order for her and her spouse    Goals Addressed             This Visit's Progress    Patient Stated  she is concerned about legal issues and financial issues and ensuring that affairs are in order for her and her spouse       Interventions:  Spoke with [name] about her needs Discussed program support for client with RN, Pharmacist and LCSW Discussed medication procurement Discussed transport assist. Her spouse transports her to and from her medical appointments.  Her spouse drives her to complete errands in the community Discussed sleeping issues Discussed oxygen use.  Discussed mood of client. She said she has started recently taking Zoloft and is hoping it will be helpful with her mood. Discussed activities at facility where she resides.  Discussed family support. One son resides in [place], [place]. One son lives in [place]. Daughter resides in [place].One son lives in [place].  She communicates with her children weekly via phone Discussed walking of client. She uses a rolling walker to ambulate Client said she is concerned about getting affairs in order such as legal matters or financial matters. She has worked with an Pensions consultant in the past. She said she sometimes procrastinates or puts things off she needs to do.  Discussed support with PCP.  She has medical appointment with PCP next week. Discussed vision of client.  She spoke of having double vision sometimes. She no longer drives..  She wears glasses Provided counseling support Encouraged client to call LCSW as needed  for SW support at [phone].           SDOH assessments and interventions completed:  Yes  SDOH Interventions Today    Flowsheet Row Most Recent Value  SDOH Interventions   Depression Interventions/Treatment  Counseling, Medication  Physical Activity Interventions Other (Comments)  [receiving physical therapy sessions 2 times weekly]  Stress Interventions Provide Counseling  [stress related to managing medical needs]        Care Coordination Interventions:  Yes, provided   Interventions Today    Flowsheet Row Most Recent Value  Chronic Disease   Chronic disease during today's visit Other  [spoke with client about client needs]  General Interventions   General Interventions Discussed/Reviewed General Interventions Discussed, [employer] program support for client]  Horticulturist, commercial (DME) Oxygen, Walker  Exercise Interventions   Exercise Discussed/Reviewed Physical Activity  [uses rolling walker to walk, ]  Physical Activity Discussed/Reviewed Physical Activity Discussed  Education Interventions   Education Provided Provided Education  Provided Verbal Education On [employer]  [discussed program support with RN, Pharmacist and LCSW]  Mental Health Interventions   Mental Health Discussed/Reviewed Anxiety, Coping Strategies, Mental Health Discussed  [stress related to legal concerns and financial concerns]  Nutrition Interventions   Nutrition Discussed/Reviewed Nutrition Discussed  [client has reduced appetite]  Pharmacy Interventions   Pharmacy Dicussed/Reviewed Pharmacy Topics Discussed  Safety Interventions   Safety Discussed/Reviewed Fall Risk  Advanced Directive Interventions   Advanced Directives Discussed/Reviewed Advanced Directives Discussed  [  discussed HCPOA. client said she has HCPOA]        Follow up plan: Follow up call scheduled for 11/28/22 at 2:30 PM     Encounter Outcome:  Pt. Visit Completed   Kelton Pillar.Derrika Ruffalo MSW, LCSW Licensed Visual merchandiser [facility] Care Management [phone]

## 2022-10-11 ENCOUNTER — Ambulatory Visit (HOSPITAL_COMMUNITY)
Admission: RE | Admit: 2022-10-11 | Discharge: 2022-10-11 | Disposition: A | Discharge status: Home / Self Care | Payer: Medicare PPO | Source: Ambulatory Visit | Attending: Nephrology | Admitting: Nephrology

## 2022-10-11 VITALS — BP 168/71 | HR 89 | Temp 97.3°F | Resp 18

## 2022-10-11 DIAGNOSIS — R278 Other lack of coordination: Secondary | ICD-10-CM | POA: Diagnosis not present

## 2022-10-11 DIAGNOSIS — N184 Chronic kidney disease, stage 4 (severe): Secondary | ICD-10-CM | POA: Diagnosis not present

## 2022-10-11 DIAGNOSIS — R2681 Unsteadiness on feet: Secondary | ICD-10-CM | POA: Diagnosis not present

## 2022-10-11 DIAGNOSIS — M6281 Muscle weakness (generalized): Secondary | ICD-10-CM | POA: Diagnosis not present

## 2022-10-11 LAB — IRON AND TIBC
Iron: 26 ug/dL — ABNORMAL LOW (ref 28–170)
Saturation Ratios: 9 % — ABNORMAL LOW (ref 10.4–31.8)
TIBC: 286 ug/dL (ref 250–450)
UIBC: 260 ug/dL

## 2022-10-11 LAB — POCT HEMOGLOBIN-HEMACUE: Hemoglobin: 11.5 g/dL — ABNORMAL LOW (ref 12.0–15.0)

## 2022-10-11 LAB — FERRITIN: Ferritin: 88 ng/mL (ref 11–307)

## 2022-10-11 MED ORDER — EPOETIN ALFA-EPBX 10000 UNIT/ML IJ SOLN
INTRAMUSCULAR | Status: AC
  Administered 2022-10-11: 10000 [IU] via SUBCUTANEOUS
  Filled 2022-10-11: qty 1

## 2022-10-11 MED ORDER — EPOETIN ALFA-EPBX 10000 UNIT/ML IJ SOLN: 10000.0000 [IU] | INTRAMUSCULAR | Status: DC

## 2022-10-12 DIAGNOSIS — Z9181 History of falling: Secondary | ICD-10-CM | POA: Diagnosis not present

## 2022-10-12 DIAGNOSIS — R278 Other lack of coordination: Secondary | ICD-10-CM | POA: Diagnosis not present

## 2022-10-12 DIAGNOSIS — M6281 Muscle weakness (generalized): Secondary | ICD-10-CM | POA: Diagnosis not present

## 2022-10-13 ENCOUNTER — Ambulatory Visit: Payer: Self-pay

## 2022-10-13 DIAGNOSIS — R2681 Unsteadiness on feet: Secondary | ICD-10-CM | POA: Diagnosis not present

## 2022-10-13 DIAGNOSIS — M6281 Muscle weakness (generalized): Secondary | ICD-10-CM | POA: Diagnosis not present

## 2022-10-13 DIAGNOSIS — R278 Other lack of coordination: Secondary | ICD-10-CM | POA: Diagnosis not present

## 2022-10-13 NOTE — Patient Instructions (Addendum)
Visit Information  Thank you for taking time to visit with me today. Please don't hesitate to contact me if I can be of assistance to you.   Following are the goals we discussed today:  Continue to take medications as prescribed Continue to attend provider visits as scheduled Continue to work with home health therapist as recommended Continue to check and record blood pressure as recommended. Notify provider with any readings outside recommended parameters Continue to eat healthy  Our next appointment is by telephone on 10/20/22 at 11:00 am  Please call the care guide team at [phone] if you need to cancel or reschedule your appointment.   If you are experiencing a Mental Health or Behavioral Health Crisis or need someone to talk to, please call the Suicide and Crisis Lifeline: 59   Kathyrn Sheriff, RN, MSN, BSN, CCM [facility] Care Coordinator [phone]

## 2022-10-13 NOTE — Patient Outreach (Signed)
  Care Coordination   Follow Up Visit Note   10/13/2022 Name: [name] MRN: [id number] DOB: May 12, 1940  [name] is a 82 y.o. year old female who sees Plotnikov, Georgina Quint, MD for primary care. I spoke with  [name] by phone today.  What matters to the patients health and wellness today?  [name] reports she continues to be active with home health agency PT/OT. She states she has a new BP cuff and has checked it against home health therapist readings. She states she is taking medications as prescribed. No questions at this time.  Goals Addressed             This Visit's Progress    Assist with maintaining health-continue to improve post rehab stay       Interventions Today    Flowsheet Row Most Recent Value  Chronic Disease   Chronic disease during today's visit Hypertension (HTN)  General Interventions   General Interventions Discussed/Reviewed General Interventions Reviewed  Doctor Visits Discussed/Reviewed Doctor Visits Reviewed  [reviewed upcoming appointments with patient]  Durable Medical Equipment (DME) BP Cuff, Oxygen  [confirmed patient has a new blood pressure cuff. she states she has checked her monitor againes OT/PT readings. confirmed with patient supplier of oxygen is  [employer]]  Pharmacy Interventions   Pharmacy Dicussed/Reviewed Pharmacy Topics Reviewed  [medications reviewed. advised to continuie to take medications as prescribed.]  Safety Interventions   Safety Discussed/Reviewed Fall Risk, Safety Discussed  [encouraged to continue to participate with home health]            SDOH assessments and interventions completed:  No  Care Coordination Interventions:  Yes, provided   Follow up plan: Follow up call scheduled for 10/20/22    Encounter Outcome:  Pt. Visit Completed   Sherry Sheriff, RN, MSN, BSN, CCM [facility] Care Coordinator [phone]

## 2022-10-16 ENCOUNTER — Ambulatory Visit: Payer: Self-pay | Admitting: Licensed Clinical Social Worker

## 2022-10-16 DIAGNOSIS — N184 Chronic kidney disease, stage 4 (severe): Secondary | ICD-10-CM | POA: Diagnosis not present

## 2022-10-16 NOTE — Patient Instructions (Signed)
Visit Information  Thank you for taking time to visit with me today. Please don't hesitate to contact me if I can be of assistance to you.   Following are the goals we discussed today:   Goals Addressed             This Visit's Progress    Patient Stated  she is concerned about legal issues and financial issues and ensuring that affairs are in order for her and her spouse       Interventions:  Spoke with [name] today via phone about her needs Discussed program support Britanee said she has Ultrasound test tomorrow Demariyah said she has meeting with attorney this week to discuss legal matters for her and her spouse Discussed client visit with PCP scheduled for this Thursday Discussed blood pressure issues. She is trying to monitor blood pressure issues Consulted with RN Kathyrn Sheriff today about client nursing needs Encouraged client to call LCSW as needed for SW support at [phone].          LCSW to call client as scheduled to assess client needs at that time   Please call the care guide team at [phone] if you need to cancel or reschedule your appointment.   If you are experiencing a Mental Health or Behavioral Health Crisis or need someone to talk to, please go to [facility] [address] [place] [phone])   The patient verbalized understanding of instructions, educational materials, and care plan provided today and DECLINED offer to receive copy of patient instructions, educational materials, and care plan.   The patient has been provided with contact information for the care management team and has been advised to call with any health related questions or concerns.   Kelton Pillar.Quan Cybulski MSW, LCSW Licensed Visual merchandiser [facility] Care Management [phone]

## 2022-10-16 NOTE — Patient Outreach (Signed)
  Care Coordination   Follow Up Visit Note   10/16/2022 Name: [name] MRN: [id number] DOB: [birth year]  [name] is a 82 y.o. year old female who sees Plotnikov, Georgina Quint, MD for primary care. I spoke with  [name] by phone today.  What matters to the patients health and wellness today?    Patient Stated  she is concerned about legal issues and financial issues and ensuring that affairs are in order for her and her spouse      Goals Addressed             This Visit's Progress    Patient Stated  she is concerned about legal issues and financial issues and ensuring that affairs are in order for her and her spouse       Interventions:  Spoke with [name] today via phone about her needs Discussed program support [name] said she has Ultrasound test tomorrow [name] said she has meeting with attorney this week to discuss legal matters for her and her spouse Discussed client visit with PCP scheduled for this Thursday Discussed blood pressure issues. She is trying to monitor blood pressure issues Consulted with RN [name] today about client nursing needs Encouraged client to call LCSW as needed for SW support at [phone].           SDOH assessments and interventions completed:  Yes  SDOH Interventions Today    Flowsheet Row Most Recent Value  SDOH Interventions   Depression Interventions/Treatment  Counseling  Physical Activity Interventions Other (Comments)  [some walking challenges]  Stress Interventions Provide Counseling  [stress in managing medical needs,  stress in managing legal matters]        Care Coordination Interventions:  Yes, provided   Interventions Today    Flowsheet Row Most Recent Value  Chronic Disease   Chronic disease during today's visit Other  [spoke with client about client needs]  General Interventions   General Interventions Discussed/Reviewed General Interventions Discussed, [employer]  [discussed  program support]  Exercise Interventions   Exercise Discussed/Reviewed Physical Activity  Physical Activity Discussed/Reviewed Physical Activity Discussed  Education Interventions   Education Provided Provided Education  Provided Verbal Education On [employer]  [discussed client scheduled meeting this week with attorney]  Mental Health Interventions   Mental Health Discussed/Reviewed Coping Strategies, Anxiety  [client has appointments set up well. Ultrasound appointment tomorrow. Meeting with attorney later this week. Meeting with PCP this Thursday]  Safety Interventions   Safety Discussed/Reviewed Fall Risk        Follow up plan: LCSW to call client as scheduled to assess client needs at that time   Encounter Outcome:  Pt. Visit Completed   Kelton Pillar.Toba Claudio MSW, LCSW Licensed Visual merchandiser [facility] Care Management [phone]

## 2022-10-17 ENCOUNTER — Ambulatory Visit (HOSPITAL_COMMUNITY)
Admission: RE | Admit: 2022-10-17 | Discharge: 2022-10-17 | Disposition: A | Discharge status: Home / Self Care | Payer: Medicare PPO | Source: Ambulatory Visit | Attending: Family Medicine | Admitting: Family Medicine

## 2022-10-17 DIAGNOSIS — N184 Chronic kidney disease, stage 4 (severe): Secondary | ICD-10-CM | POA: Insufficient documentation

## 2022-10-17 DIAGNOSIS — I10 Essential (primary) hypertension: Secondary | ICD-10-CM | POA: Insufficient documentation

## 2022-10-18 DIAGNOSIS — R278 Other lack of coordination: Secondary | ICD-10-CM | POA: Diagnosis not present

## 2022-10-18 DIAGNOSIS — M6281 Muscle weakness (generalized): Secondary | ICD-10-CM | POA: Diagnosis not present

## 2022-10-18 DIAGNOSIS — R2681 Unsteadiness on feet: Secondary | ICD-10-CM | POA: Diagnosis not present

## 2022-10-19 ENCOUNTER — Ambulatory Visit: Payer: Medicare PPO | Admitting: Internal Medicine

## 2022-10-19 ENCOUNTER — Encounter: Payer: Self-pay | Admitting: Internal Medicine

## 2022-10-19 VITALS — BP 120/60 | HR 75 | Temp 98.2°F | Ht 63.0 in | Wt 133.0 lb

## 2022-10-19 DIAGNOSIS — R278 Other lack of coordination: Secondary | ICD-10-CM | POA: Diagnosis not present

## 2022-10-19 DIAGNOSIS — N184 Chronic kidney disease, stage 4 (severe): Secondary | ICD-10-CM | POA: Diagnosis not present

## 2022-10-19 DIAGNOSIS — R2681 Unsteadiness on feet: Secondary | ICD-10-CM | POA: Diagnosis not present

## 2022-10-19 DIAGNOSIS — M6281 Muscle weakness (generalized): Secondary | ICD-10-CM | POA: Diagnosis not present

## 2022-10-19 DIAGNOSIS — I771 Stricture of artery: Secondary | ICD-10-CM

## 2022-10-19 DIAGNOSIS — I10 Essential (primary) hypertension: Secondary | ICD-10-CM | POA: Diagnosis not present

## 2022-10-19 DIAGNOSIS — Z9181 History of falling: Secondary | ICD-10-CM | POA: Diagnosis not present

## 2022-10-19 MED ORDER — CLONIDINE 0.1 MG/24HR TD PTWK: 0.1000 mg | MEDICATED_PATCH | TRANSDERMAL | 12 refills | Status: DC

## 2022-10-19 MED ORDER — SERTRALINE HCL 50 MG PO TABS
50.0000 mg | ORAL_TABLET | Freq: Every day | ORAL | 3 refills | Status: DC
Start: 2022-10-19 — End: 2022-12-10

## 2022-10-19 MED ORDER — CLONIDINE HCL 0.2 MG PO TABS: 0.2000 mg | ORAL_TABLET | Freq: Four times a day (QID) | ORAL | 3 refills | Status: DC | PRN

## 2022-10-19 NOTE — Assessment & Plan Note (Addendum)
Renal artery [place] - OK 09/2022 HTN: start Clonidine patch + Clonidine po prn May need to d/c Losartan

## 2022-10-19 NOTE — Assessment & Plan Note (Signed)
2024 70% - no abd pain

## 2022-10-19 NOTE — Progress Notes (Signed)
Subjective:  Patient ID: [name], female    DOB: 09-29-1940  Age: 82 y.o. MRN: [id number]  CC: Follow-up   HPI [name] presents for HTN - Clonidine helps, but does not last for more than 6 hrs F/u on depression  Outpatient Medications Prior to Visit  Medication Sig Dispense Refill   acetaminophen (TYLENOL) 325 MG tablet Take 2 tablets (650 mg total) by mouth 2 (two) times daily as needed (generalized pain). 100 tablet 2   albuterol (VENTOLIN HFA) 108 (90 Base) MCG/ACT inhaler Inhale 1-2 puffs into the lungs every 4 (four) hours as needed for wheezing or shortness of breath. 1 each 2   apixaban (ELIQUIS) 2.5 MG TABS tablet Take 1 tablet (2.5 mg total) by mouth 2 (two) times daily. 180 tablet 1   ascorbic acid (VITAMIN C) 500 MG tablet Take 1 tablet (500 mg total) by mouth daily. 30 tablet 2   atorvastatin (LIPITOR) 80 MG tablet Take 1 tablet (80 mg total) by mouth daily. 90 tablet 1   carvedilol (COREG) 6.25 MG tablet Take 6.25 mg by mouth 2 (two) times daily with a meal.     Cholecalciferol (VITAMIN D3) 50 MCG (2000 UT) capsule Take 1 capsule (2,000 Units total) by mouth daily. 100 capsule 3   clotrimazole-betamethasone (LOTRISONE) cream Apply 1 Application topically 2 (two) times daily. Mouth corner rash 30 g 3   Cyanocobalamin 2500 MCG TABS Take 2,500 mcg by mouth daily. 30 tablet 3   docusate sodium (COLACE) 100 MG capsule Take 1-2 capsules (100-200 mg total) by mouth daily as needed for mild constipation. 100 capsule 1   Fexofenadine HCl (ALLERGY 24-HR PO) Take 1 tablet by mouth in the morning and at bedtime. OTC - 12 hour     Golimumab 50 MG/0.5ML SOAJ Take 50 mg by mouth every 2 (two) months. Hold for the next 2 weeks per Orthopedic surgery recommendations. 0.3 mL    leflunomide (ARAVA) 20 MG tablet Take 1 tablet (20 mg total) by mouth daily. (Patient taking differently: Take 10 mg by mouth daily.) 30 tablet 1   levothyroxine (SYNTHROID) 88 MCG tablet Take 1 tablet (88  mcg total) by mouth daily before breakfast. 90 tablet 1   losartan (COZAAR) 50 MG tablet Take 1 tablet (50 mg total) by mouth daily. 30 tablet 0   Multiple Vitamins-Minerals (CENTRUM ADULT PO) Take 1 tablet by mouth daily. 1 Tablet Daily.     pantoprazole (PROTONIX) 40 MG tablet Take 1 tablet (40 mg total) by mouth daily. 180 tablet 1   polyethylene glycol (MIRALAX / GLYCOLAX) 17 g packet Take 17 g by mouth daily as needed. 14 each 1   triamcinolone (NASACORT) 55 MCG/ACT AERO nasal inhaler Place 1 spray into the nose daily. 1 each 3   cloNIDine (CATAPRES) 0.2 MG tablet Take 1 tablet (0.2 mg total) by mouth 3 (three) times daily as needed for up to 30 doses. For systolic blood pressure (top number) over 200 mmhg 30 tablet 0   sertraline (ZOLOFT) 25 MG tablet Take 1 tablet (25 mg total) by mouth daily. 30 tablet 2   No facility-administered medications prior to visit.    ROS: Review of Systems  Objective:  BP 120/60 (BP Location: Left Arm, Patient Position: Sitting, Cuff Size: Large)   Pulse 75   Temp 98.2 F (36.8 C) (Oral)   Ht 5\' 3"  (1.6 m)   Wt 133 lb (60.3 kg)   SpO2 90%   BMI 23.56 kg/m  BP Readings from Last 3 Encounters:  10/19/22 120/60  10/11/22 (!) 168/71  10/09/22 (!) 158/78    Wt Readings from Last 3 Encounters:  10/19/22 133 lb (60.3 kg)  10/09/22 134 lb (60.8 kg)  10/04/22 134 lb (60.8 kg)    Physical Exam  Lab Results  Component Value Date   WBC 9.8 09/30/2022   HGB 11.5 (L) 10/11/2022   HCT 32.9 (L) 09/30/2022   PLT 343 09/30/2022   GLUCOSE 124 (H) 10/04/2022   CHOL 157 09/07/2020   TRIG 121.0 09/07/2020   HDL 59.30 09/07/2020   LDLDIRECT 162.1 10/28/2009   LDLCALC 74 09/07/2020   ALT 18 10/04/2022   AST 25 10/04/2022   NA 133 (L) 10/04/2022   K 5.3 (H) 10/04/2022   CL 101 10/04/2022   CREATININE 3.11 (H) 10/04/2022   BUN 49 (H) 10/04/2022   CO2 22 10/04/2022   TSH 2.94 09/05/2022   INR 1.2 08/02/2022    VAS US RENAL ARTERY  DUPLEX  Result Date: 10/17/2022 ABDOMINAL VISCERAL Patient Name:  [name]  Date of Exam:   10/17/2022 Medical Rec #: [id number]       Accession #:    [id number] Date of Birth: [birth year]        Patient Gender: F Patient Age:   66 years Exam Location:  [facility] Procedure:      VAS US RENAL ARTERY DUPLEX Referring Phys: [id number] Robin Searing JOYCE -------------------------------------------------------------------------------- Indications: Hypertension High Risk Factors: Hypertension, hyperlipidemia, past history of smoking, prior                    MI, coronary artery disease. Other Factors: TIA, CKD, CABG x1, AAA s/p repair, SMA & CA stenosis, CA                aneurysm. Comparison Study: No previous exams. MRA on 11/10/21 showed CA and SMA stenosis.                   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 LRA WNL Performing Technologist: Jody Hill RVT, RDMS  Examination Guidelines: A complete evaluation includes B-mode imaging, spectral Doppler, color Doppler, and power Doppler as needed of all accessible portions of each vessel. Bilateral testing is considered an integral part of a complete examination. Limited examinations for reoccurring indications may be performed as noted.  Duplex Findings: +----------------------+--------+--------+------+--------+ Mesenteric            PSV cm/sEDV cm/sPlaqueComments +----------------------+--------+--------+------+--------+ Aorta Prox               73      14                  +----------------------+--------+--------+------+--------+ Celiac Artery Origin    421                          +----------------------+--------+--------+------+--------+ Celiac Artery Proximal  619     132                  +----------------------+--------+--------+------+--------+ SMA Proximal            487     102                  +----------------------+--------+--------+------+--------+ SMA Mid                 407      73                   +----------------------+--------+--------+------+--------+  Mesenteric Technologist observations: Dilation of CA post stenosis measuring 1.54 x 0.98 x 1.02 cm    +------------------+--------+--------+-------+ Right Renal ArteryPSV cm/sEDV cm/sComment +------------------+--------+--------+-------+ Origin               84      11           +------------------+--------+--------+-------+ Proximal            106      18           +------------------+--------+--------+-------+ Mid                 129      18           +------------------+--------+--------+-------+ Distal               79      14           +------------------+--------+--------+-------+ +-----------------+--------+--------+-------+ Left Renal ArteryPSV cm/sEDV cm/sComment +-----------------+--------+--------+-------+ Origin             112      20           +-----------------+--------+--------+-------+ Proximal            73      14           +-----------------+--------+--------+-------+ Mid                 62      14           +-----------------+--------+--------+-------+ Distal              75      10           +-----------------+--------+--------+-------+  Technologist observations: RRA bifurcates at the distal portion, seen on previous imaging. +------------+--------+--------+----+-----------+--------+--------+----+ Right KidneyPSV cm/sEDV cm/sRI  Left KidneyPSV cm/sEDV cm/sRI   +------------+--------+--------+----+-----------+--------+--------+----+ Upper Pole  18      5       0.72Upper Pole 18      4       0.76 +------------+--------+--------+----+-----------+--------+--------+----+ Mid         23      6       0.        17      5       0.70 +------------+--------+--------+----+-----------+--------+--------+----+ Lower Pole  15      5       0.66Lower Pole 20      5       0.75 +------------+--------+--------+----+-----------+--------+--------+----+ Hilar        22      6       0.72Hilar      24      6       0.75 +------------+--------+--------+----+-----------+--------+--------+----+ +------------------+---------+------------------+-----+ Right Kidney               Left Kidney             +------------------+---------+------------------+-----+ RAR                        RAR                     +------------------+---------+------------------+-----+ RAR (manual)               RAR (manual)            +------------------+---------+------------------+-----+ Cortex            17/6 0.64Cortex                  +------------------+---------+------------------+-----+  Cortex thickness           Corex thickness         +------------------+---------+------------------+-----+ Kidney length (cm)11.10    Kidney length (cm)10.96 +------------------+---------+------------------+-----+  Summary: Renal:  Right: No evidence of right renal artery stenosis. Abnormal right        Resistive Index. Multiple cysts noted, as seen on previous        imaging. Left:  No evidence of left renal artery stenosis. Abnormal left        Resisitve Index. Multiple cysts noted, as seen on previous        imaging. 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  *See table(s) above for measurements and observations.  Diagnosing physician: Waverly Ferrari MD  Electronically signed by Waverly Ferrari MD on 10/17/2022 at 6:14:55 PM.    Final     Assessment & Plan:   Problem List Items Addressed This Visit     Celiac artery stenosis (HCC)    2024 70% - no abd pain      Relevant Medications   cloNIDine (CATAPRES) 0.2 MG tablet   cloNIDine (CATAPRES - DOSED IN MG/24 HR) 0.1 mg/24hr patch   CKD (chronic kidney disease) stage 4, GFR 15-29 ml/min (HCC) - baseline SCr 2.5. follows with Dr. Malen Gauze with [facility] - Primary    Renal artery [place] - OK 09/2022 HTN: start Clonidine patch + Clonidine po prn May need to d/c  Losartan      Essential hypertension (Chronic)    Worse Renal artery [place] - OK 09/2022 HTN: start Clonidine patch + Clonidine po prn      Relevant Medications   cloNIDine (CATAPRES) 0.2 MG tablet   cloNIDine (CATAPRES - DOSED IN MG/24 HR) 0.1 mg/24hr patch      Meds ordered this encounter  Medications   cloNIDine (CATAPRES) 0.2 MG tablet    Sig: Take 1 tablet (0.2 mg total) by mouth 4 (four) times daily as needed for up to 30 doses. For systolic blood pressure (top number) over 200 mmhg    Dispense:  100 tablet    Refill:  3   cloNIDine (CATAPRES - DOSED IN MG/24 HR) 0.1 mg/24hr patch    Sig: Place 1 patch (0.1 mg total) onto the skin once a week.    Dispense:  4 patch    Refill:  12   sertraline (ZOLOFT) 50 MG tablet    Sig: Take 1 tablet (50 mg total) by mouth daily.    Dispense:  90 tablet    Refill:  3      Follow-up: Return in about 6 weeks (around 11/30/2022) for a follow-up visit.  Sonda Primes, MD

## 2022-10-19 NOTE — Assessment & Plan Note (Signed)
Worse Renal artery [place] - [place] 09/2022 HTN: start Clonidine patch + Clonidine po prn

## 2022-10-20 ENCOUNTER — Ambulatory Visit: Payer: Self-pay

## 2022-10-20 NOTE — Patient Outreach (Signed)
  Care Coordination   Follow Up Visit Note   10/20/2022 Name: [name] MRN: [id number] DOB: [birth year]  ANACLARA ACKLIN is a 82 y.o. year old female who sees Plotnikov, Georgina Quint, MD for primary care. I spoke with  [name] by phone today.  What matters to the patients health and wellness today?  PCP visit completed on 10/19/22. Per Patient clonidine patch prescribed, but not in stock at her usual pharmacy. [facility] confirms that they have the prescribed patch in stock and will fill for patient-Expected to be ready around 2 pm today. Patient notified and states she has transportation pick up the prescription today. [employer] states someone will call patient to discuss questions about oxygen and her upcoming travel plans.    Goals Addressed             This Visit's Progress    Assist with maintaining health-continue to improve post rehab stay       Interventions Today    Flowsheet Row Most Recent Value  General Interventions   General Interventions Discussed/Reviewed General Interventions Reviewed, [employer], Communication with  [facility] called [employer] re: patient questions about traveling with oxygen]  Doctor Visits Discussed/Reviewed Doctor Visits Discussed, PCP  PCP/Specialist Visits Compliance with follow-up visit  [reviewed upcoming appointments. advsied to continue to attend as scheduled/recommended]  Pharmacy Interventions   Pharmacy Dicussed/Reviewed Pharmacy Topics Reviewed, Pharmacy Topics Discussed  [[facility] [address] [facility] to [facility] prescription for clonidine patch.]  Safety Interventions   Safety Discussed/Reviewed Safety Discussed  [encouraged to work with home health therapist as recommended]            SDOH assessments and interventions completed:  No  Care Coordination Interventions:  Yes, provided   Follow up plan: Follow up call scheduled for 11/13/22    Encounter Outcome:  Pt. Visit Completed    Kathyrn Sheriff, RN, MSN, BSN, CCM [facility] Care Coordinator [phone]

## 2022-10-20 NOTE — Patient Instructions (Signed)
Visit Information  Thank you for taking time to visit with me today. Please don't hesitate to contact me if I can be of assistance to you.   Following are the goals we discussed today:  Please pick up your [facility] [address] [phone]). It should be ready after 2 pm. Contact me if you have any difficulty obtaining your prescription. Continue to take medications as prescribed Continue to attend provider visits as scheduled Continue to check and record blood pressure and Notify provider with any readings outside recommended parameters Continue to work with home health therapist as recommended   Our next appointment is by telephone on 11/13/22 at 2:00 pm  Please call the care guide team at [phone] if you need to cancel or reschedule your appointment.   If you are experiencing a Mental Health or Behavioral Health Crisis or need someone to talk to, please call the Suicide and Crisis Lifeline: 34  Kathyrn Sheriff, RN, MSN, BSN, CCM [facility] Care Coordinator [phone]

## 2022-10-23 ENCOUNTER — Other Ambulatory Visit (HOSPITAL_COMMUNITY): Payer: Self-pay | Admitting: *Deleted

## 2022-10-24 ENCOUNTER — Telehealth: Payer: Self-pay

## 2022-10-24 DIAGNOSIS — R278 Other lack of coordination: Secondary | ICD-10-CM | POA: Diagnosis not present

## 2022-10-24 DIAGNOSIS — M6281 Muscle weakness (generalized): Secondary | ICD-10-CM | POA: Diagnosis not present

## 2022-10-24 DIAGNOSIS — Z9181 History of falling: Secondary | ICD-10-CM | POA: Diagnosis not present

## 2022-10-24 NOTE — Patient Instructions (Signed)
Visit Information  Thank you for taking time to visit with me today. Please don't hesitate to contact me if I can be of assistance to you.   Following are the goals we discussed today:  Oxygen supplies with [employer] [phone] Attend provider visits as scheduled Take medications as scheduled Contact RN Care coordinator if any other care coordination needs [phone]   Our next appointment is by telephone on 11/13/22 at 2:00 pm  Please call the care guide team at [phone] if you need to cancel or reschedule your appointment.   If you are experiencing a Mental Health or Behavioral Health Crisis or need someone to talk to, please call the Suicide and Crisis Lifeline: 21  Kathyrn Sheriff, RN, MSN, BSN, CCM [facility] Care Coordinator [phone]

## 2022-10-24 NOTE — Patient Outreach (Signed)
  Care Coordination   Follow Up Visit Note   10/24/2022 Name: YUDIT MODESITT MRN: [id number] DOB: [birth year]  BRECKIN ZAFAR is a 82 y.o. year old female who sees Plotnikov, Georgina Quint, MD for primary care. I spoke with  [name] by phone today.  What matters to the patients health and wellness today?  Mrs. Runyan provided consent for RNCM to return call to daughter in law Orlean Holtrop. Monica expressed plans [place] beach in [place] and with questions regarding which company supplies patient's oxygen and process for getting patient oxygen tank at the beach. Mrs. Lich states she has not received a call back from [employer] yet. Monica to contact [employer].   Goals Addressed             This Visit's Progress    Assist with maintaining health-continue to improve post rehab stay       Interventions Today    Flowsheet Row Most Recent Value  General Interventions   General Interventions Discussed/Reviewed [employer]  [Provided daughter in Manufacturing engineer information for [employer], supplier of patient's oxygen. Reinforced will need patient on the line to obtain requested information.]  Pharmacy Interventions   Pharmacy Dicussed/Reviewed Pharmacy Topics Reviewed  [confirmed patient received clonidine patch]            SDOH assessments and interventions completed:  No  Care Coordination Interventions:  Yes, provided   Follow up plan:  as previously scheduled    Encounter Outcome:  Pt. Visit Completed   Kathyrn Sheriff, RN, MSN, BSN, CCM [facility] Care Coordinator [phone]

## 2022-10-25 ENCOUNTER — Encounter (HOSPITAL_COMMUNITY): Payer: Self-pay

## 2022-10-25 ENCOUNTER — Telehealth: Payer: Self-pay | Admitting: Pulmonary Disease

## 2022-10-25 ENCOUNTER — Encounter (HOSPITAL_COMMUNITY)
Admission: RE | Admit: 2022-10-25 | Discharge: 2022-10-25 | Disposition: A | Discharge status: Home / Self Care | Payer: Medicare PPO | Source: Ambulatory Visit | Attending: Nephrology | Admitting: Nephrology

## 2022-10-25 ENCOUNTER — Inpatient Hospital Stay (HOSPITAL_COMMUNITY): Admission: RE | Admit: 2022-10-25 | Payer: Medicare PPO | Source: Ambulatory Visit

## 2022-10-25 ENCOUNTER — Ambulatory Visit (INDEPENDENT_AMBULATORY_CARE_PROVIDER_SITE_OTHER): Payer: Medicare PPO

## 2022-10-25 DIAGNOSIS — N184 Chronic kidney disease, stage 4 (severe): Secondary | ICD-10-CM | POA: Diagnosis not present

## 2022-10-25 DIAGNOSIS — R278 Other lack of coordination: Secondary | ICD-10-CM | POA: Diagnosis not present

## 2022-10-25 DIAGNOSIS — M6281 Muscle weakness (generalized): Secondary | ICD-10-CM | POA: Diagnosis not present

## 2022-10-25 DIAGNOSIS — G4719 Other hypersomnia: Secondary | ICD-10-CM

## 2022-10-25 DIAGNOSIS — G4733 Obstructive sleep apnea (adult) (pediatric): Secondary | ICD-10-CM

## 2022-10-25 DIAGNOSIS — J449 Chronic obstructive pulmonary disease, unspecified: Secondary | ICD-10-CM | POA: Diagnosis not present

## 2022-10-25 DIAGNOSIS — R2681 Unsteadiness on feet: Secondary | ICD-10-CM | POA: Diagnosis not present

## 2022-10-25 MED ORDER — SODIUM CHLORIDE 0.9 % IV SOLN
510.0000 mg | Freq: Once | INTRAVENOUS | Status: AC
  Administered 2022-10-25: 510 mg via INTRAVENOUS
  Filled 2022-10-25: qty 510

## 2022-10-25 NOTE — Telephone Encounter (Signed)
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

## 2022-10-26 DIAGNOSIS — Z9181 History of falling: Secondary | ICD-10-CM | POA: Diagnosis not present

## 2022-10-26 DIAGNOSIS — Z9889 Other specified postprocedural states: Secondary | ICD-10-CM | POA: Diagnosis not present

## 2022-10-26 DIAGNOSIS — R278 Other lack of coordination: Secondary | ICD-10-CM | POA: Diagnosis not present

## 2022-10-26 DIAGNOSIS — I120 Hypertensive chronic kidney disease with stage 5 chronic kidney disease or end stage renal disease: Secondary | ICD-10-CM | POA: Diagnosis not present

## 2022-10-26 DIAGNOSIS — E875 Hyperkalemia: Secondary | ICD-10-CM | POA: Diagnosis not present

## 2022-10-26 DIAGNOSIS — M6281 Muscle weakness (generalized): Secondary | ICD-10-CM | POA: Diagnosis not present

## 2022-10-26 DIAGNOSIS — K922 Gastrointestinal hemorrhage, unspecified: Secondary | ICD-10-CM | POA: Diagnosis not present

## 2022-10-26 DIAGNOSIS — I2510 Atherosclerotic heart disease of native coronary artery without angina pectoris: Secondary | ICD-10-CM | POA: Diagnosis not present

## 2022-10-26 DIAGNOSIS — D631 Anemia in chronic kidney disease: Secondary | ICD-10-CM | POA: Diagnosis not present

## 2022-10-26 DIAGNOSIS — N185 Chronic kidney disease, stage 5: Secondary | ICD-10-CM | POA: Diagnosis not present

## 2022-10-27 DIAGNOSIS — R2681 Unsteadiness on feet: Secondary | ICD-10-CM | POA: Diagnosis not present

## 2022-10-27 DIAGNOSIS — M6281 Muscle weakness (generalized): Secondary | ICD-10-CM | POA: Diagnosis not present

## 2022-10-27 DIAGNOSIS — R278 Other lack of coordination: Secondary | ICD-10-CM | POA: Diagnosis not present

## 2022-10-29 DIAGNOSIS — R278 Other lack of coordination: Secondary | ICD-10-CM | POA: Diagnosis not present

## 2022-10-29 DIAGNOSIS — Z9181 History of falling: Secondary | ICD-10-CM | POA: Diagnosis not present

## 2022-10-29 DIAGNOSIS — M6281 Muscle weakness (generalized): Secondary | ICD-10-CM | POA: Diagnosis not present

## 2022-10-30 ENCOUNTER — Ambulatory Visit: Payer: Self-pay | Admitting: Licensed Clinical Social Worker

## 2022-10-30 DIAGNOSIS — M6281 Muscle weakness (generalized): Secondary | ICD-10-CM | POA: Diagnosis not present

## 2022-10-30 DIAGNOSIS — R2681 Unsteadiness on feet: Secondary | ICD-10-CM | POA: Diagnosis not present

## 2022-10-30 DIAGNOSIS — R278 Other lack of coordination: Secondary | ICD-10-CM | POA: Diagnosis not present

## 2022-10-30 DIAGNOSIS — Z9181 History of falling: Secondary | ICD-10-CM | POA: Diagnosis not present

## 2022-10-30 NOTE — Patient Outreach (Signed)
  Care Coordination   Follow Up Visit Note   10/30/2022 Name: [name] MRN: [id number] DOB: Dec 01, 1940  [name] is a 82 y.o. year old female who sees Plotnikov, Georgina Quint, MD for primary care. I spoke with  [name] by phone today.  What matters to the patients health and wellness today?   Patient Stated  she is concerned about legal issues and financial issues and ensuring that affairs are in order for her and her spouse    Goals Addressed             This Visit's Progress    Patient Stated  she is concerned about legal issues and financial issues and ensuring that affairs are in order for her and her spouse       Interventions:  Spoke with [name] via phone today about her status and current needs Discussed program support for client with RN, LCSW, and Pharmacist Spoke with Chaia about her recent meeting with attorney. She said she and her spouse met with attorney recently and that attorney is helping her with legal matters. She feels better about legal matters since she met with attorney recently Discussed blood pressure issues. She is trying to monitor blood pressure issues. She said she has been talking with her PCP about blood pressure issues of client Communicated with RN Kathyrn Sheriff today about client dietary issues. Provided counseling support for client Client said her spouse drives her to and from her appointments . Discussed pain issues. Discussed sleeping issues.  Spoke with Irving Burton about her medical support  from kidney specialist, Dr. Malen Gauze nursing needs Discussed relaxation techniques. She used to enjoy playing bridge. She likes to read . She has been prescribed Zoloft by her PCP. She said she thinks that Zoloft is helping her mood   Encouraged client to call LCSW as needed for SW support at [phone].           SDOH assessments and interventions completed:  Yes  SDOH Interventions Today    Flowsheet Row Most Recent Value  SDOH  Interventions   Depression Interventions/Treatment  Counseling  Physical Activity Interventions Other (Comments)  [client has low energy occasonally]  Stress Interventions Provide Counseling  [client has stress related to managing medical issues. Client has some stress in managing needs of her spouse]        Care Coordination Interventions:  Yes, provided    Interventions Today    Flowsheet Row Most Recent Value  Chronic Disease   Chronic disease during today's visit Other  [spoke with client about client needs]  General Interventions   General Interventions Discussed/Reviewed General Interventions Discussed, [employer]  [discussed program support]  Exercise Interventions   Exercise Discussed/Reviewed Physical Activity  [client has low energy occasionally]  Physical Activity Discussed/Reviewed Physical Activity Discussed  Education Interventions   Education Provided Provided Education  Provided Verbal Education On [employer]  [discussed client support with RN Kathyrn Sheriff, RN Kathyrn Sheriff is scheduled to call client on 11/13/22]  Nutrition Interventions   Nutrition Discussed/Reviewed Nutrition Discussed  [client said she has to watch her potassium intake]  Pharmacy Interventions   Pharmacy Dicussed/Reviewed Pharmacy Topics Discussed       Follow up plan:  LCSW to call client as scheduled to assess client needs at that time   Encounter Outcome:  Pt. Visit Completed   Kelton Pillar.Catheryne Deford MSW, LCSW Licensed Visual merchandiser [facility] Care Management [phone]

## 2022-10-30 NOTE — Patient Instructions (Signed)
Visit Information  Thank you for taking time to visit with me today. Please don't hesitate to contact me if I can be of assistance to you.   Following are the goals we discussed today:   Goals Addressed             This Visit's Progress    Patient Stated  she is concerned about legal issues and financial issues and ensuring that affairs are in order for her and her spouse       Interventions:  Spoke with [name] via phone today about her status and current needs Discussed program support for client with RN, LCSW, and Pharmacist Spoke with Kerri-Anne about her recent meeting with attorney. She said she and her spouse met with attorney recently and that attorney is helping her with legal matters. She feels better about legal matters since she met with attorney recently Discussed blood pressure issues. She is trying to monitor blood pressure issues. She said she has been talking with her PCP about blood pressure issues of client Communicated with RN Kathyrn Sheriff today about client dietary issues. Provided counseling support for client Client said her spouse drives her to and from her appointments . Discussed pain issues. Discussed sleeping issues.  Spoke with Irving Burton about her medical support  from kidney specialist, Dr. Malen Gauze nursing needs Discussed relaxation techniques. She used to enjoy playing bridge. She likes to read . She has been prescribed Zoloft by her PCP. She said she thinks that Zoloft is helping her mood   Encouraged client to call LCSW as needed for SW support at [phone].          LCSW to call client as scheduled to assess client needs  Please call the care guide team at [phone] if you need to cancel or reschedule your appointment.   If you are experiencing a Mental Health or Behavioral Health Crisis or need someone to talk to, please go to [facility] [address] [place] [phone])   The patient verbalized  understanding of instructions, educational materials, and care plan provided today and DECLINED offer to receive copy of patient instructions, educational materials, and care plan.   The patient has been provided with contact information for the care management team and has been advised to call with any health related questions or concerns.   Kelton Pillar.Sherril Heyward MSW, LCSW Licensed Visual merchandiser [facility] Care Management [phone]

## 2022-10-31 DIAGNOSIS — R278 Other lack of coordination: Secondary | ICD-10-CM | POA: Diagnosis not present

## 2022-10-31 DIAGNOSIS — M6281 Muscle weakness (generalized): Secondary | ICD-10-CM | POA: Diagnosis not present

## 2022-10-31 DIAGNOSIS — Z9181 History of falling: Secondary | ICD-10-CM | POA: Diagnosis not present

## 2022-11-01 ENCOUNTER — Ambulatory Visit: Payer: Medicare PPO | Admitting: Nurse Practitioner

## 2022-11-01 ENCOUNTER — Encounter: Payer: Self-pay | Admitting: Nurse Practitioner

## 2022-11-01 VITALS — BP 150/86 | HR 72 | Ht 63.0 in | Wt 130.4 lb

## 2022-11-01 DIAGNOSIS — J432 Centrilobular emphysema: Secondary | ICD-10-CM

## 2022-11-01 DIAGNOSIS — G4733 Obstructive sleep apnea (adult) (pediatric): Secondary | ICD-10-CM

## 2022-11-01 DIAGNOSIS — J9601 Acute respiratory failure with hypoxia: Secondary | ICD-10-CM | POA: Diagnosis not present

## 2022-11-01 NOTE — Assessment & Plan Note (Addendum)
Mild obstruction with minimal symptom burden. She is not currently on maintenance therapies. Her March hospitalization was due to sepsis from e coli pyelonephritis/bacteremia and not AECOPD. She prefers to remain off maintenance inhaler. She will continue PRN SABA. Action plan in place.

## 2022-11-01 NOTE — Progress Notes (Signed)
@Patient  ID: [name], female    DOB: [birth year], 82 y.o.   MRN: [id number]  Chief Complaint  Patient presents with   Follow-up    Hst f/u     Referring provider: Plotnikov, Georgina Quint, MD  HPI: 82 year old female, former smoker followed for mild COPD and upper airway cough. She is a patient of Dr. Thurston Hole and last seen in office July 2023. Past medical history significant for splenic artery aneurysm, TIA, HTN, mesenteric artery stenosis, CAD s/p CABG, ruptured AAA s/p repair, diaphragmatic hernia, allergic rhinitis, OSA not on CPAP, GERD, hypothyroid, RA, CKD.  TEST/EVENTS:  08/2019 PFT: FVC 96, FEV1 87, ratio 71, TLC 148, DLCOunc 98. Positive BD 02/07/2022 echo: EF 65-70%, severe LVH. GIIDD. RV size and function. Normal PASP. Trivial MR.  07/15/2022 CXR: b/l lower lobe airspace opacities, increased from prior. Small b/l effusions.  10/09/2022 HST: AHI 60/hr, SpO2 low 70%  11/07/2021: OV with Dr. Sherene Sires. Maintained on Stiolto and gabapentin. Limited mobility due to hip - plans for L THR. Surgical evaluation completed. Follow up yearly or as needed. Walk test without desaturations.   09/26/2022: Ov with Jarica Plass NP for follow up. She was hospitalized in March for sepsis due to e coli pyelonephritis with e coli bacteremia and acute hypoxic respiratory failure from 07/02/2022-07/25/2022. Hypoxia was felt to be related to atelectasis and improved with pulmonary toileting. She was discharged to rehab on room air. However while there, she was noted to stop breathing when she was sleeping at night and had some low O2 readings so she was restarted on supplemental oxygen. She was set up for a home sleep study but once she was discharged, they canceled this appointment. She has just been using oxygen when she sleeps at night. Her O2 levels during the day have been above 90% on room air. She does not have any trouble with her breathing. Gabapentin was stopped in rehab and cough has not returned. She is feeling  better. She is not currently on any maintenance inhalers. Has not felt like she's needed her rescue inhaler but does have on available.  She tells me she continues to have daytime tiredness, which has been ongoing for years. She does feel like this got worse after her hospitalization but seems to be back to her normal now. She snores at night. She did have some witnessed apneic events at rehab while sleeping. She takes a nap most days. Tends to doze if she's sitting in a recliner. She doesn't feel like she rests well. She does not drive.  Denies sleep parasomnias/paralysis. She gets around 7-8 hours of sleep a night. Wakes a few times to use the restroom. No sleep aids. No excessive caffeine intake.   11/01/2022: Today - follow up Patient presents today for follow up to discuss sleep study results, which revealed severe OSA. She continues to struggle with poor sleep quality and feeling tired during the day. Often naps in her recliner. Wakes feeling poorly rested. She feels like her breathing is the same as it was her last visit. Doesn't really notice any difficulties with it. She hasn't had to use her oxygen during the day. Still wearing it at night. Did not wear it the night of her sleep study. She does not drive. No sleep parasomnias/paralysis.   Allergies  Allergen Reactions   Nsaids Other (See Comments)    Stomach upset Told to avoid due to Eliquis   Aspirin Other (See Comments)    Stomach upset Told  to avoid due to Eliquis   Codeine Nausea And Vomiting   Nitrostat [Nitroglycerin] Other (See Comments)    Bradycardia. Drop in heart rate     Immunization History  Administered Date(s) Administered   Covid-19, Mrna,Vaccine(Spikevax)16yrs and older 06/09/2022   Fluad Quad(high Dose 65+) 02/03/2021, 01/16/2022   Influenza Whole 05/02/2001, 01/27/2008, 01/12/2010, 12/31/2010, 01/25/2012   Influenza, High Dose Seasonal PF 01/14/2014, 02/13/2015, 12/21/2016, 01/09/2018, 12/29/2018    Influenza,inj,Quad PF,6+ Mos 12/23/2015   Influenza-Unspecified 02/03/2013, 01/13/2014, 02/13/2015, 01/20/2020   Meningococcal polysaccharide vaccine (MPSV4) 03/21/2012   Moderna SARS-COV2 Booster Vaccination 03/15/2020   Moderna Sars-Covid-2 Vaccination 06/02/2019, 06/30/2019   PPD Test 11/18/2013   Pfizer Covid-19 Vaccine Bivalent Booster 79yrs & up 01/19/2021, 10/12/2021   Pneumococcal Conjugate-13 03/20/2013   Pneumococcal Polysaccharide-23 05/02/2000, 06/23/2010, 02/26/2018   Td 05/01/2006   Tdap 06/01/2016   Zoster Recombinant(Shingrix) 05/16/2017, 07/20/2017   Zoster, Live 01/17/2006    Past Medical History:  Diagnosis Date   Abdominal aortic aneurysm (HCC)    REPAIRED IN 1996 BY [name]  AND HAS RECENTLY BEEN FOLLOWED BY [name]   Acquired asplenia     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 Most likely scar. Mild subendocardial ischemia. EF 71% LOW RISK.    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 Kellie Simmering   Pulmonary emboli [facility]) 02/06/2022   01/2022 PE/DVT post-op. Lower GI  bleed     Treat edema  Start Eliquis at the reduced dose due to Creat>1.5 and age >68   Rheumatoid arthritis (HCC) 2011   Dr.Truslow; fracture knees, hands and wrists -    S/P CABG x 1 1996   CABG--LIMA-LAD for ostial LAD (not felt to be PCI amenable). EF NORMAL then; LIMA now atretic   Shortness of breath dyspnea    with exertion   Stroke (HCC) 11/2014   TIA    Urinary frequency     Tobacco History: Social History   Tobacco Use  Smoking Status Former   Types: Cigarettes   Quit date: 12/04/1958   Years since quitting: 63.9   Passive exposure: Never  Smokeless Tobacco Never   Counseling given: Not Answered   Outpatient Medications Prior to Visit  Medication Sig Dispense Refill   acetaminophen (TYLENOL) 325 MG tablet Take 2 tablets (650 mg total) by mouth 2 (two) times daily as needed (generalized pain). 100 tablet 2   albuterol (VENTOLIN HFA) 108 (90 Base) MCG/ACT inhaler  Inhale 1-2 puffs into the lungs every 4 (four) hours as needed for wheezing or shortness of breath. 1 each 2   apixaban (ELIQUIS) 2.5 MG TABS tablet Take 1 tablet (2.5 mg total) by mouth 2 (two) times daily. 180 tablet 1   ascorbic acid (VITAMIN C) 500 MG tablet Take 1 tablet (500 mg total) by mouth daily. 30 tablet 2   atorvastatin (LIPITOR) 80 MG tablet Take 1 tablet (80 mg total) by mouth daily. 90 tablet 1   carvedilol (COREG) 6.25 MG tablet Take 6.25 mg by mouth 2 (two) times daily with a meal.     Cholecalciferol (VITAMIN D3) 50 MCG (2000 UT) capsule Take 1 capsule (2,000 Units total) by mouth daily. 100 capsule 3   cloNIDine (CATAPRES - DOSED IN MG/24 HR) 0.1 mg/24hr patch Place 1 patch (0.1 mg total) onto the skin once a week. (Patient taking differently: Place 0.2 mg onto the skin once a week.) 4 patch 12   cloNIDine (CATAPRES) 0.2 MG tablet Take 1 tablet (0.2 mg total) by mouth 4 (four) times daily as needed for up to 30 doses. For systolic blood pressure (top number) over 200 mmhg 100 tablet 3    clotrimazole-betamethasone (LOTRISONE) cream Apply 1 Application topically 2 (two) times daily. Mouth corner rash 30 g 3   Cyanocobalamin 2500 MCG TABS Take 2,500 mcg by mouth daily. 30 tablet 3   docusate sodium (COLACE) 100 MG capsule Take 1-2 capsules (100-200 mg total) by mouth daily as needed for mild constipation. 100 capsule 1   Fexofenadine HCl (ALLERGY 24-HR PO) Take 1 tablet by mouth in the morning and at bedtime. OTC - 12 hour     Golimumab 50 MG/0.5ML SOAJ Take 50 mg by mouth every 2 (two) months. Hold for the next 2 weeks per Orthopedic surgery recommendations. 0.3 mL    leflunomide (ARAVA) 20 MG tablet Take 1 tablet (20 mg total) by mouth daily. (Patient taking differently: Take 10 mg by mouth daily.) 30 tablet 1   levothyroxine (SYNTHROID) 88 MCG tablet Take 1 tablet (88 mcg total) by mouth daily before breakfast. 90 tablet 1   losartan (COZAAR) 50 MG tablet Take 1 tablet (50 mg total) by mouth daily. 30 tablet 0   Multiple Vitamins-Minerals (CENTRUM ADULT PO) Take 1 tablet by mouth daily. 1 Tablet Daily.     pantoprazole (PROTONIX) 40 MG tablet Take 1 tablet (40 mg total) by mouth daily. 180 tablet 1   polyethylene glycol (MIRALAX / GLYCOLAX) 17 g packet Take 17 g by mouth daily as needed. 14 each 1   sertraline (ZOLOFT) 50 MG tablet Take 1 tablet (50 mg total) by mouth daily. (Patient taking differently: Take 25 mg by mouth daily.) 90 tablet 3   triamcinolone (NASACORT) 55 MCG/ACT AERO nasal inhaler Place 1 spray into the nose daily. 1 each 3   No facility-administered medications prior to visit.     Review of Systems:   Constitutional: No weight loss or gain, night sweats, fevers, chills,or lassitude.  +fatigue  HEENT: No headaches, difficulty swallowing, tooth/dental problems, or sore throat. No sneezing, itching, ear ache, nasal congestion, or post nasal drip CV:  No chest pain, orthopnea, PND, swelling in lower extremities, anasarca, dizziness, palpitations, syncope Resp:  +snoring, witnessed apneas. No shortness of breath with exertion or at rest. No excess mucus or change in color of mucus. No productive or non-productive. No hemoptysis. No wheezing.  No chest wall deformity GI:  No heartburn, indigestion, abdominal pain, nausea,  vomiting, diarrhea, change in bowel habits, loss of appetite, bloody stools.  GU: No dysuria, change in color of urine, urgency or frequency.   Skin: No rash, lesions, ulcerations MSK:  No joint pain or swelling.   Neuro: No dizziness or lightheadedness.  Psych: No depression or anxiety. Mood stable. +sleep disturbance     Physical Exam:  BP (!) 150/86   Pulse 72   Ht 5\' 3"  (1.6 m)   Wt 130 lb 6.4 oz (59.1 kg)   SpO2 95%   BMI 23.10 kg/m   GEN: Pleasant, interactive, well-kempt; in no acute distress. HEENT:  Normocephalic and atraumatic. PERRLA. Sclera white. Nasal turbinates pink, moist and patent bilaterally. No rhinorrhea present. Oropharynx pink and moist, without exudate or edema. No lesions, ulcerations, or postnasal drip. Mallampati II/III NECK:  Supple w/ fair ROM. No JVD present. Normal carotid impulses w/o bruits. Thyroid symmetrical with no goiter or nodules palpated. No lymphadenopathy.   CV: RRR, no m/r/g, no peripheral edema. Pulses intact, +2 bilaterally. No cyanosis, pallor or clubbing. PULMONARY:  Unlabored, regular breathing. Clear bilaterally A&P w/o wheezes/rales/rhonchi. No accessory muscle use.  GI: BS present and normoactive. Soft, non-tender to palpation. No organomegaly or masses detected.  MSK: No erythema, warmth or tenderness. Cap refil <2 sec all extrem. No deformities or joint swelling noted.  Neuro: A/Ox3. No focal deficits noted.   Skin: Warm, no lesions or rashe Psych: Normal affect and behavior. Judgement and thought content appropriate.     Lab Results:  CBC    Component Value Date/Time   WBC 9.8 09/30/2022 1849   RBC 3.36 (L) 09/30/2022 1849   HGB 11.5 (L) 10/11/2022 1325   HCT  32.9 (L) 09/30/2022 1849   PLT 343 09/30/2022 1849   MCV 97.9 09/30/2022 1849   MCV 90.9 01/29/2015 1524   MCH 31.0 09/30/2022 1849   MCHC 31.6 09/30/2022 1849   RDW 16.1 (H) 09/30/2022 1849   LYMPHSABS 1.5 09/30/2022 1849   MONOABS 1.5 (H) 09/30/2022 1849   EOSABS 0.3 09/30/2022 1849   BASOSABS 0.2 (H) 09/30/2022 1849    BMET    Component Value Date/Time   NA 133 (L) 10/04/2022 1300   NA 141 08/01/2022 0800   K 5.3 (H) 10/04/2022 1300   CL 101 10/04/2022 1300   CO2 22 10/04/2022 1300   GLUCOSE 124 (H) 10/04/2022 1300   BUN 49 (H) 10/04/2022 1300   BUN 57 (A) 08/01/2022 0800   CREATININE 3.11 (H) 10/04/2022 1300   CREATININE 2.19 (H) 12/26/2019 1005   CALCIUM 9.4 10/04/2022 1300   GFRNONAA 14 (L) 10/04/2022 1300   GFRNONAA 20 (L) 12/26/2019 1005   GFRAA 24 (L) 12/26/2019 1005    BNP    Component Value Date/Time   BNP 865.3 (H) 07/12/2022 0826   BNP 445 (H) 08/08/2019 1538     Imaging:  VAS US RENAL ARTERY DUPLEX  Result Date: 10/17/2022 ABDOMINAL VISCERAL Patient Name:  ADELINA LATIN  Date of Exam:   10/17/2022 Medical Rec #: [id number]       Accession #:    [id number] Date of Birth: 11-08-1940        Patient Gender: F Patient Age:   18 years Exam Location:  [facility] Procedure:      VAS US RENAL ARTERY DUPLEX Referring Phys: [id number] Robin Searing JOYCE -------------------------------------------------------------------------------- Indications: Hypertension High Risk Factors: Hypertension, hyperlipidemia, past history of smoking, prior  MI, coronary artery disease. Other Factors: TIA, CKD, CABG x1, AAA s/p repair, SMA & CA stenosis, CA                aneurysm. Comparison Study: No previous exams. MRA on 11/10/21 showed CA and SMA stenosis.                   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 LRA WNL Performing Technologist: Jody Hill RVT, RDMS  Examination Guidelines: A complete evaluation includes B-mode imaging, spectral Doppler, color Doppler, and power  Doppler as needed of all accessible portions of each vessel. Bilateral testing is considered an integral part of a complete examination. Limited examinations for reoccurring indications may be performed as noted.  Duplex Findings: +----------------------+--------+--------+------+--------+ Mesenteric            PSV cm/sEDV cm/sPlaqueComments +----------------------+--------+--------+------+--------+ Aorta Prox               73      14                  +----------------------+--------+--------+------+--------+ Celiac Artery Origin    421                          +----------------------+--------+--------+------+--------+ Celiac Artery Proximal  619     132                  +----------------------+--------+--------+------+--------+ SMA Proximal            487     102                  +----------------------+--------+--------+------+--------+ SMA Mid                 407      73                  +----------------------+--------+--------+------+--------+  Mesenteric Technologist observations: Dilation of CA post stenosis measuring 1.54 x 0.98 x 1.02 cm    +------------------+--------+--------+-------+ Right Renal ArteryPSV cm/sEDV cm/sComment +------------------+--------+--------+-------+ Origin               84      11           +------------------+--------+--------+-------+ Proximal            106      18           +------------------+--------+--------+-------+ Mid                 129      18           +------------------+--------+--------+-------+ Distal               79      14           +------------------+--------+--------+-------+ +-----------------+--------+--------+-------+ Left Renal ArteryPSV cm/sEDV cm/sComment +-----------------+--------+--------+-------+ Origin             112      20           +-----------------+--------+--------+-------+ Proximal            73      14           +-----------------+--------+--------+-------+ Mid                  62      14           +-----------------+--------+--------+-------+ Distal  75      10           +-----------------+--------+--------+-------+  Technologist observations: RRA bifurcates at the distal portion, seen on previous imaging. +------------+--------+--------+----+-----------+--------+--------+----+ Right KidneyPSV cm/sEDV cm/sRI  Left KidneyPSV cm/sEDV cm/sRI   +------------+--------+--------+----+-----------+--------+--------+----+ Upper Pole  18      5       0.72Upper Pole 18      4       0.76 +------------+--------+--------+----+-----------+--------+--------+----+ Mid         23      6       0.        17      5       0.70 +------------+--------+--------+----+-----------+--------+--------+----+ Lower Pole  15      5       0.66Lower Pole 20      5       0.75 +------------+--------+--------+----+-----------+--------+--------+----+ Hilar       22      6       0.72Hilar      24      6       0.75 +------------+--------+--------+----+-----------+--------+--------+----+ +------------------+---------+------------------+-----+ Right Kidney               Left Kidney             +------------------+---------+------------------+-----+ RAR                        RAR                     +------------------+---------+------------------+-----+ RAR (manual)               RAR (manual)            +------------------+---------+------------------+-----+ Cortex            17/6 0.64Cortex                  +------------------+---------+------------------+-----+ Cortex thickness           Corex thickness         +------------------+---------+------------------+-----+ Kidney length (cm)11.10    Kidney length (cm)10.96 +------------------+---------+------------------+-----+  Summary: Renal:  Right: No evidence of right renal artery stenosis. Abnormal right        Resistive Index. Multiple cysts noted, as seen on previous         imaging. Left:  No evidence of left renal artery stenosis. Abnormal left        Resisitve Index. Multiple cysts noted, as seen on previous        imaging. 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  *See table(s) above for measurements and observations.  Diagnosing physician: Waverly Ferrari MD  Electronically signed by Waverly Ferrari MD on 10/17/2022 at 6:14:55 PM.    Final          Latest Ref Rng & Units 09/18/2019    8:47 AM 01/14/2018    9:00 AM 02/19/2015    9:43 AM  PFT Results  FVC-Pre L 2.35  2.29  2.56   FVC-Predicted Pre % 96  91  97   FVC-Post L 2.57  2.47  2.68   FVC-Predicted Post % 105  98  101   Pre FEV1/FVC % % 67  62  62   Post FEV1/FCV % % 71  68  66   FEV1-Pre L 1.58  1.42  1.58   FEV1-Predicted Pre % 87  76  80  FEV1-Post L 1.83  1.68  1.78   DLCO uncorrected ml/min/mmHg 17.91  19.35  16.27   DLCO UNC% % 98  84  71   DLCO corrected ml/min/mmHg 17.91     DLCO COR %Predicted % 98     DLVA Predicted % 91  90  78   TLC L 7.29  5.45  5.41   TLC % Predicted % 148  111  110   RV % Predicted % 206  136  123     No results found for: "NITRICOXIDE"      Assessment & Plan:   Severe obstructive sleep apnea Severe OSA with AHI 60/h. Reviewed risks of untreated OSA and potential treatment options. Shared decision to start CPAP therapy given severity. Orders placed for auto CPAP 5-20 cmH2O, mask of choice and heated humidity. Educated on proper use/care of device. Risks/benefits reviewed. She will also need to undergo CPAP titration to ensure adequate control on CPAP and evaluate for continued need for supplemental O2 - placed today. She will continue to use supplemental oxygen in the interim 2 lpm at night.   Patient Instructions  Continue Albuterol inhaler 2 puffs every 6 hours as needed for shortness of breath or wheezing. Notify if symptoms persist despite rescue inhaler/neb use or if you are having to use this  frequently Monitor oxygen levels at home for goal >88-90%.   Start CPAP 5-20 cmH2O, mask of choice and heated humidity, every night, minimum of 4-6 hours a night.  Change equipment every 30 days or as directed by DME. Wash your tubing with warm soap and water daily, hang to dry. Wash humidifier portion weekly. Use bottled, distilled water and change daily   Be aware of reduced alertness and do not drive or operate heavy machinery if experiencing this or drowsiness.  Notify if persistent daytime sleepiness occurs even with consistent use of CPAP.  We discussed how untreated sleep apnea puts an individual at risk for cardiac arrhthymias, pulm HTN, DM, stroke and increases their risk for daytime accidents. We also briefly reviewed treatment options including weight loss, side sleeping position, oral appliance, CPAP therapy or referral to ENT for possible surgical options  CPAP titration study ordered for further evaluation to make sure you do not need oxygen with your CPAP- someone will contact you for scheduling   Follow up in 10-12 weeks with Dr. Wynona Neat or Philis Nettle. If symptoms do not improve or worsen, please contact office for sooner follow up or seek emergency care.    COPD (chronic obstructive pulmonary disease) (HCC) Mild obstruction with minimal symptom burden. She is not currently on maintenance therapies. Her March hospitalization was due to sepsis from e coli pyelonephritis/bacteremia and not AECOPD. She prefers to remain off maintenance inhaler. She will continue PRN SABA. Action plan in place.   Acute respiratory failure (HCC) Resolved. See above regarding nocturnal hypoxia    I spent 35 minutes of dedicated to the care of this patient on the date of this encounter to include pre-visit review of records, face-to-face time with the patient discussing conditions above, post visit ordering of testing, clinical documentation with the electronic health record, making appropriate  referrals as documented, and communicating necessary findings to members of the patients care team.  Noemi Chapel, NP 11/01/2022  Pt aware and understands NP's role.

## 2022-11-01 NOTE — Assessment & Plan Note (Addendum)
Severe OSA with AHI 60/h. Reviewed risks of untreated OSA and potential treatment options. Shared decision to start CPAP therapy given severity. Orders placed for auto CPAP 5-20 cmH2O, mask of choice and heated humidity. Educated on proper use/care of device. Risks/benefits reviewed. She will also need to undergo CPAP titration to ensure adequate control on CPAP and evaluate for continued need for supplemental O2 - placed today. She will continue to use supplemental oxygen in the interim 2 lpm at night.   Patient Instructions  Continue Albuterol inhaler 2 puffs every 6 hours as needed for shortness of breath or wheezing. Notify if symptoms persist despite rescue inhaler/neb use or if you are having to use this frequently Monitor oxygen levels at home for goal >88-90%.   Start CPAP 5-20 cmH2O, mask of choice and heated humidity, every night, minimum of 4-6 hours a night.  Change equipment every 30 days or as directed by DME. Wash your tubing with warm soap and water daily, hang to dry. Wash humidifier portion weekly. Use bottled, distilled water and change daily   Be aware of reduced alertness and do not drive or operate heavy machinery if experiencing this or drowsiness.  Notify if persistent daytime sleepiness occurs even with consistent use of CPAP.  We discussed how untreated sleep apnea puts an individual at risk for cardiac arrhthymias, pulm HTN, DM, stroke and increases their risk for daytime accidents. We also briefly reviewed treatment options including weight loss, side sleeping position, oral appliance, CPAP therapy or referral to ENT for possible surgical options  CPAP titration study ordered for further evaluation to make sure you do not need oxygen with your CPAP- someone will contact you for scheduling   Follow up in 10-12 weeks with Dr. Wynona Neat or Philis Nettle. If symptoms do not improve or worsen, please contact office for sooner follow up or seek emergency care.

## 2022-11-01 NOTE — Patient Instructions (Addendum)
Continue Albuterol inhaler 2 puffs every 6 hours as needed for shortness of breath or wheezing. Notify if symptoms persist despite rescue inhaler/neb use or if you are having to use this frequently Monitor oxygen levels at home for goal >88-90%.   Start CPAP 5-20 cmH2O, mask of choice and heated humidity, every night, minimum of 4-6 hours a night.  Change equipment every 30 days or as directed by DME. Wash your tubing with warm soap and water daily, hang to dry. Wash humidifier portion weekly. Use bottled, distilled water and change daily   Be aware of reduced alertness and do not drive or operate heavy machinery if experiencing this or drowsiness.  Notify if persistent daytime sleepiness occurs even with consistent use of CPAP.  We discussed how untreated sleep apnea puts an individual at risk for cardiac arrhthymias, pulm HTN, DM, stroke and increases their risk for daytime accidents. We also briefly reviewed treatment options including weight loss, side sleeping position, oral appliance, CPAP therapy or referral to ENT for possible surgical options  CPAP titration study ordered for further evaluation to make sure you do not need oxygen with your CPAP- someone will contact you for scheduling   Follow up in 10-12 weeks with Dr. Wynona Neat or Philis Nettle. If symptoms do not improve or worsen, please contact office for sooner follow up or seek emergency care.

## 2022-11-01 NOTE — Assessment & Plan Note (Signed)
Resolved. See above regarding nocturnal hypoxia

## 2022-11-03 DIAGNOSIS — M6281 Muscle weakness (generalized): Secondary | ICD-10-CM | POA: Diagnosis not present

## 2022-11-03 DIAGNOSIS — R2681 Unsteadiness on feet: Secondary | ICD-10-CM | POA: Diagnosis not present

## 2022-11-03 DIAGNOSIS — R278 Other lack of coordination: Secondary | ICD-10-CM | POA: Diagnosis not present

## 2022-11-06 ENCOUNTER — Ambulatory Visit: Payer: Medicare PPO | Admitting: Internal Medicine

## 2022-11-06 DIAGNOSIS — R278 Other lack of coordination: Secondary | ICD-10-CM | POA: Diagnosis not present

## 2022-11-06 DIAGNOSIS — R2681 Unsteadiness on feet: Secondary | ICD-10-CM | POA: Diagnosis not present

## 2022-11-06 DIAGNOSIS — M6281 Muscle weakness (generalized): Secondary | ICD-10-CM | POA: Diagnosis not present

## 2022-11-07 DIAGNOSIS — Z9181 History of falling: Secondary | ICD-10-CM | POA: Diagnosis not present

## 2022-11-07 DIAGNOSIS — R278 Other lack of coordination: Secondary | ICD-10-CM | POA: Diagnosis not present

## 2022-11-07 DIAGNOSIS — M6281 Muscle weakness (generalized): Secondary | ICD-10-CM | POA: Diagnosis not present

## 2022-11-08 ENCOUNTER — Ambulatory Visit (HOSPITAL_COMMUNITY)
Admission: RE | Admit: 2022-11-08 | Discharge: 2022-11-08 | Disposition: A | Discharge status: Home / Self Care | Payer: Medicare PPO | Source: Ambulatory Visit | Attending: Nephrology | Admitting: Nephrology

## 2022-11-08 VITALS — BP 142/58 | HR 74 | Temp 97.5°F | Resp 20

## 2022-11-08 DIAGNOSIS — N184 Chronic kidney disease, stage 4 (severe): Secondary | ICD-10-CM | POA: Insufficient documentation

## 2022-11-08 LAB — FERRITIN: Ferritin: 672 ng/mL — ABNORMAL HIGH (ref 11–307)

## 2022-11-08 LAB — IRON AND TIBC
Iron: 103 ug/dL (ref 28–170)
Saturation Ratios: 38 % — ABNORMAL HIGH (ref 10.4–31.8)
TIBC: 273 ug/dL (ref 250–450)
UIBC: 170 ug/dL

## 2022-11-08 LAB — POCT HEMOGLOBIN-HEMACUE: Hemoglobin: 10.7 g/dL — ABNORMAL LOW (ref 12.0–15.0)

## 2022-11-08 MED ORDER — EPOETIN ALFA-EPBX 10000 UNIT/ML IJ SOLN
INTRAMUSCULAR | Status: AC
  Filled 2022-11-08: qty 1

## 2022-11-08 MED ORDER — EPOETIN ALFA-EPBX 10000 UNIT/ML IJ SOLN
10000.0000 [IU] | INTRAMUSCULAR | Status: DC
  Administered 2022-11-08: 10000 [IU] via SUBCUTANEOUS

## 2022-11-09 DIAGNOSIS — Z9181 History of falling: Secondary | ICD-10-CM | POA: Diagnosis not present

## 2022-11-09 DIAGNOSIS — R278 Other lack of coordination: Secondary | ICD-10-CM | POA: Diagnosis not present

## 2022-11-09 DIAGNOSIS — M6281 Muscle weakness (generalized): Secondary | ICD-10-CM | POA: Diagnosis not present

## 2022-11-09 NOTE — Progress Notes (Signed)
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 Pt reports some dyspnea with exertion, some morning dry to wet cough. Pt does not use any inhalers.   Cough better but some cc am congestion x years ? Worse in winter > total ? Usually swallows so can't say color or amt assoc with mild pnds  Doe only with exertion = MMRC1 = can walk nl pace, flat grade, can't hurry or go uphills or steps s mild sob  rec Please remember to go to the lab  department downstairs for your tests - we will call you with the results when they are available. 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 No new co's.  doe = MMRC1 = can walk nl pace, flat grade, can't hurry or go uphills or steps s sob   Only a little drainage p one chlorpheniramine in am and double strength at hs and sleep fine now rec Continue protonix and pepcid for another 2 months then fine to try to taper  off by stopping the protonix first and changing the pepcid 20  mg twice daily then taper the pepcid off as well and see if cough flares or not - if does flare, restart and you may need GI evaluation  For drainage / throat tickle try take CHLORPHENIRAMINE  4 mg - take one every 4 hours as needed - available over the counter- may cause drowsiness so start with just a bedtime dose or two and see how you tolerate it bentin 100 mg three times daily  efore trying in daytime   If not satisfied we start you on gabap   10/08/17  Excisional bx LUL c/w nec granuloma    01/14/2018  f/u ov/Avry Roedl re:  GOLD I copd with reversible component . Did not take the prednisone  Chief Complaint  Patient presents with   Follow-up    PFT's done today. Her cough and SOB are about the same. No new co's.   Dyspnea:  MMRC1 = can walk nl pace, flat grade, can't hurry or go uphills or steps s sob   Cough: more of throat clearing/ daytime x years / p  rx fosfamax >>  worse since then  Sleeping: flat bed / one pillow SABA use: none 02: none  rec Start BREO  100 one click each am x 2 drags each am  Try off fosfamax for now  Please schedule a follow up office visit in 6 weeks, call sooner if needed      02/06/2019  f/u ov/Waleed Dettman re:  Gradually downhill back on biphosphonates /Breo with only GOLD I criteria previously  Chief Complaint  Patient presents with   Follow-up    Breathing is no better since last visit. She is getting SOB with exertion such as just standing up. She rarely uses her albuterol inhaler, does not feel it helps. She has non prod cough.   Dyspnea:  MMRC3 = can't walk 100 yards even at a slow pace at a flat grade s stopping due to sob   Cough: worse in fall and winter but day > noct with sensation of globus Sleeping: flat / one pillow no resp symptoms noct  SABA use: rarely, not helping  02: none  rec Pantoprazole (protonix) 40 mg   Take  30-60 min before first meal of the day and Pepcid (famotidine)  20 mg one after supper  until return to office - this is the best  way to tell whether stomach acid is contributing to your problem and stop the risedronate for now  GERD diet  Prednisone 10 mg take  4 each am x 2 days,   2 each am x 2 days,  1 each am x 2 days and stop  Stop breo and start symbicort 80 Take 2 puffs first thing in am and then another 2 puffs about 12 hours later      02/21/2019  f/u ov/Ader Fritze re: GOLD I copd, worsening sob  Chief Complaint  Patient presents with   Follow-up    Breathing is unchanged. She rarely uses her albuterol inhaler.   Dyspnea:  Room to room but also limited by L hip pain  Cough: all day sensation of globus but no mucus x years  Sleeping: flat/ one pillow  SABA use: used once and no better  02: none rec Stop symbicort to see what difference if any it makes Gabapentin is 100 mg three times daily  Stop losartan and start metaprolol 25 mg twice daily           09/26/2019  f/u ov/Ej Pinson re: GOLD I copd/recurrent cough /sob off gabapentin  Chief Complaint  Patient presents with   Follow-up    pt states sob all times.pt states not using rescue inhaler  Dyspnea:  One hall at a time  Then has to stop at [facility]  Cough: worse off gabapentin  Then ran out of h1 and flared / more yellow /productive in am/ convinced it's due to "drainage"  Sleeping: denies resp symptoms   Waking her  up  SABA use: doesn't help 02: no   rec Gabapentin 100 mg three times a day and if not satisfied with the cough and sensation of too much throat mucus  We will call to schedule a sinus CT Please remember to go to the  x-ray department  for your tests - we will call you with the results when they are available   12/04/2019  f/u ov/Jury Caserta re:  Copd I / uacs  Chief Complaint  Patient presents with   Follow-up    Breathing is about the same. Cough has been worse- prod but unsure of sputum color. She has not been using her albuterol inhaler.   Dyspnea:  One hallway and stops  Cough: sounds  juicier in am  but nothing comes up  Sleeping:  typically not waking her  SABA use: none 02: none  rec Stiolto  2 pffs first thing in am and you should  breathe better within a few days to a week and call for refill  Gabapentin 100 mg x 2 x three times a day for at least a week and then start gabapentin 300 three times a day    01/29/2020  f/u ov/Windie Marasco re: COPD I / uacs / not able to remember lunch time gabapentin Chief Complaint  Patient presents with   Follow-up    Breathing is overall doing well. She states does have some "congestion and a frog in my throat". She has not had to use her albuterol inhaler since her last visit.   Dyspnea: more limited by legs than sob  Cough: frog in throat and dry mouth since stiolto 2 each am but thinks breathing better  Sleeping: bed is flat SABA use: none 02: none  rec Try to reduce stiolto to one puff each am to see if helps your dry mouth (think of like high octane fuel ok to adjust up down or off) Ok to spread out the gabapentin 300 mg three times a day If throat problems continue you will need to be referred to [facility]  Voice center Dr Harriette Ohara     11/04/2020  f/u ov/Erminio Nygard re:  UACS/ GOLD I/ RA  stiolto maint one puff daily and gabapentin 300 tid  No chief complaint on file. Dyspnea:  walking halls at [facility] x 10 min then tires Cough: none and wants to try off gerd rx  Sleeping: no resp cc prone bed flat SABA use: none  02: none  Covid status:   vax x 4 / never infected  Rec Stop pantoprazole and Try pepcid 20 mg after breakfast and supper x 2 weeks, then just after supper x 2 weeks then stop If cough or breathing get worse, resume:  Pantoprazole (protonix) 40 mg   Take  30-60 min before first meal of the day and Pepcid (famotidine)  20 mg after supper until return to office    11/07/2021  f/u ov/Leata Dominy re: UACS / GOLD 1 / RA [place]   maint on  stiolto one each am/Arava   Chief Complaint  Patient presents with   Follow-up    Pt states she has experienced some SOB.    Dyspnea:   really limited by L hip pain / plans L THR Cough: none  Sleeping: no resp cc  SABA use: not using  02: none  Covid status:   vax x 4 / never infected  Rec Ok to increase stiolto to 2 puffs in am if limited by your breathing from desired activities    11/10/2022  f/u ov/Reianna Batdorf re: GOLD 1/ uacs resolved   maint on no rx  Chief Complaint  Patient presents with   Follow-up    States cpap will be delivered next week, using 2L O2 at night, some DOE  Dyspnea:  rollator - walking to DR from [place] "long way"  main issue is fatigue Cough: none  Sleeping: cpap titration Aug 1st / sleeps in flat bed, 2 pillows  SABA use: none  02: 2lpm hs      No obvious day to day or daytime variability or assoc excess/ purulent sputum or mucus plugs or hemoptysis or cp or chest tightness, subjective wheeze or overt sinus or hb symptoms.   *** without nocturnal  or early  am exacerbation  of respiratory  c/o's or need for noct saba. Also denies any obvious fluctuation of symptoms with weather or environmental changes or other aggravating or alleviating factors except as outlined above   No unusual exposure hx or h/o childhood pna/ asthma or knowledge of premature birth.  Current Allergies, Complete Past Medical History, Past Surgical History, Family History, and Social History were reviewed in [employer] record.  ROS  The following are not active complaints unless bolded Hoarseness, sore throat, dysphagia, dental problems, itching, sneezing,  nasal congestion or discharge of excess mucus or purulent secretions, ear ache,   fever, chills, sweats, unintended wt loss or wt gain, classically pleuritic or exertional cp,  orthopnea pnd or arm/hand swelling  or leg swelling, presyncope, palpitations, abdominal pain, anorexia, nausea, vomiting, diarrhea  or change in bowel habits or change in bladder habits, change in stools or change in urine, dysuria, hematuria,  rash, arthralgias, visual  complaints, headache, numbness, weakness or ataxia or problems with walking or coordination,  change in mood or  memory.        Current Meds  Medication Sig   acetaminophen (TYLENOL) 325 MG tablet Take 2 tablets (650 mg total) by mouth 2 (two) times daily as needed (generalized pain).   albuterol (VENTOLIN HFA) 108 (90 Base) MCG/ACT inhaler Inhale 1-2 puffs into the lungs every 4 (four) hours as needed for wheezing or shortness of breath.   apixaban (ELIQUIS) 2.5 MG TABS tablet Take 1 tablet (2.5 mg total) by mouth 2 (two) times daily.   ascorbic acid (VITAMIN C) 500 MG tablet Take 1 tablet (500 mg total) by mouth daily.   atorvastatin (LIPITOR) 80 MG tablet Take 1 tablet (80 mg total) by mouth daily.   carvedilol (COREG) 6.25 MG tablet Take 6.25 mg by mouth 2 (two) times daily with a meal.   Cholecalciferol (VITAMIN D3) 50 MCG (2000 UT) capsule Take 1 capsule (2,000 Units total) by mouth daily.   cloNIDine (CATAPRES - DOSED IN MG/24 HR) 0.1 mg/24hr patch Place 1 patch (0.1 mg total) onto the skin once a week. (Patient taking differently: Place 0.2 mg onto the skin once a week.)   cloNIDine (CATAPRES) 0.2 MG tablet Take 1 tablet (0.2 mg total) by mouth 4 (four) times daily as needed for up to 30 doses. For systolic blood pressure (top number) over 200 mmhg   clotrimazole-betamethasone (LOTRISONE) cream Apply 1 Application topically 2 (two) times daily. Mouth corner rash   Cyanocobalamin 2500 MCG TABS Take 2,500 mcg by mouth daily.   docusate sodium (COLACE) 100 MG capsule Take 1-2 capsules (100-200 mg total) by mouth daily as needed for mild constipation.   Fexofenadine HCl (ALLERGY 24-HR PO) Take 1 tablet by mouth in the morning and at bedtime. OTC - 12 hour   Golimumab 50 MG/0.5ML SOAJ Take 50 mg by mouth every 2 (two) months. Hold for the next 2 weeks per Orthopedic surgery recommendations.   leflunomide (ARAVA) 20 MG tablet Take 1 tablet (20 mg total) by mouth daily. (Patient taking  differently: Take 10 mg by mouth daily.)   levothyroxine (SYNTHROID) 88 MCG tablet Take 1 tablet (88 mcg total) by mouth daily before breakfast.   losartan (COZAAR) 50 MG tablet Take 1 tablet (50 mg total) by mouth daily.   Multiple Vitamins-Minerals (CENTRUM ADULT PO) Take 1 tablet by mouth daily. 1 Tablet Daily.   pantoprazole (PROTONIX) 40 MG tablet Take 1 tablet (40 mg total) by mouth daily.  polyethylene glycol (MIRALAX / GLYCOLAX) 17 g packet Take 17 g by mouth daily as needed.   sertraline (ZOLOFT) 50 MG tablet Take 1 tablet (50 mg total) by mouth daily. (Patient taking differently: Take 25 mg by mouth daily.)   triamcinolone (NASACORT) 55 MCG/ACT AERO nasal inhaler Place 1 spray into the nose daily.               Objective:   Physical Exam  wts  11/10/2022           ***  11/07/2021          152    11/04/2020           157  05/04/2020          153  01/29/2020         150  12/04/2019           148 09/26/2019         146  03/20/2019       151  02/21/2019       148 02/06/2019         148 04/30/2018      148 01/14/2018        144  12/03/2017          147  09/26/2016        141  08/07/2016          146  06/19/2016        152  05/22/2016        148   02/15/15 155 lb 6.4 oz (70.489 kg)  01/29/15 154 lb (69.854 kg)  11/26/14 153 lb 8 oz (69.627 kg)    Vital signs reviewed  11/10/2022  - Note at rest 02 sats  ***% on ***   General appearance:    ***    Min bar***          Assessment & Plan:

## 2022-11-10 ENCOUNTER — Ambulatory Visit: Payer: Medicare PPO | Admitting: Internal Medicine

## 2022-11-10 ENCOUNTER — Encounter: Payer: Self-pay | Admitting: Internal Medicine

## 2022-11-10 VITALS — BP 138/62 | HR 76 | Temp 97.6°F | Ht 63.0 in | Wt 130.8 lb

## 2022-11-10 DIAGNOSIS — R2681 Unsteadiness on feet: Secondary | ICD-10-CM | POA: Diagnosis not present

## 2022-11-10 DIAGNOSIS — J432 Centrilobular emphysema: Secondary | ICD-10-CM | POA: Diagnosis not present

## 2022-11-10 DIAGNOSIS — R278 Other lack of coordination: Secondary | ICD-10-CM | POA: Diagnosis not present

## 2022-11-10 DIAGNOSIS — M6281 Muscle weakness (generalized): Secondary | ICD-10-CM | POA: Diagnosis not present

## 2022-11-10 NOTE — Patient Instructions (Signed)
Make sure you check your oxygen saturation  AT  your highest level of activity (NOT after you stop)   to be sure it stays over 90% and adjust  02 flow upward to maintain this level if needed but remember to turn it back to previous settings when you stop (to conserve your supply).    Any problems with CPAP should be directed back to [facility]    Follow up in this clinic is as needed

## 2022-11-11 ENCOUNTER — Encounter: Payer: Self-pay | Admitting: Internal Medicine

## 2022-11-11 NOTE — Assessment & Plan Note (Signed)
Quit smoking in her 26s - PFT's  02/19/15   FEV1 1.78 (90 % ) ratio 66  p 12 % improvement from saba p nothing  prior to study with DLCO  71 % corrects to 78 % for alv volume  And classical curvature  - Spirometry 06/19/2016  FEV1 1.65  (84%)  Ratio 69  With no prior rx  - PFT's  01/14/2018  FEV1 1.68 (90 % ) ratio 68  p 18 % improvement from saba p nothing  prior to study with DLCO  84 % corrects to 90  % for alv volume  - 01/14/2018    try BREO 100 each am > worse 02/06/2019 so d/c'd - 02/06/2019  After extensive coaching inhaler device,  effectiveness =   75% with hfa > change to symb 80 2bid and add spacer next ov if uacs component not improved  - 02/06/2019   Walked RA x one lap =  approx 250 ft - stopped due to  Sob with sats 95% avg pace  - 02/21/2019   Walked RA  2 laps @  approx 239ft each @ slow pace  stopped due to tired more than sob / sats 90%   - PFT's  09/18/19   FEV1 1.83 (101 % ) ratio 0.71  p 15 % improvement from saba p nothing prior to study with DLCO  17.91 (98%) corrects to 3.74 (91 %)  for alv volume and FV curve mild concavity on exp portion   09/26/2019   Walked RA 3 laps @ approx 211ft each @  Slow pace  stopped due sob several times  with sats 97% or higher   - 05/04/2020 try stiolto one each am for throat discomfort despite 300 tid of gabapentin  > improved as of 11/04/2020 on stiolto one daily s increased doe  -  11/07/2021  After extensive coaching inhaler device,  effectiveness =    80% with [facility]  - 11/07/2021   Walked on RA   x  3  lap(s) =  approx 750  ft  @ rollator pace, stopped due to end of study  with lowest 02 sats 95%    Pt is Group B in terms of symptom/risk and laba/lama therefore appropriate rx at this point >>>  stiolto and using approp saba  so f/u can be prn    Advised: Make sure you check your oxygen saturation at your highest level of activity(NOT after you stop)  to be sure it stays over 90% and keep track of it at least once a week, more often if breathing getting  worse, and let me know if losing ground. (Collect the dots to connect the dots approach)           Each maintenance medication was reviewed in detail including emphasizing most importantly the difference between maintenance and prns and under what circumstances the prns are to be triggered using an action plan format where appropriate.  Total time for H and P, chart review, counseling, reviewing smi/hfa  device(s) and generating customized AVS unique to this office visit / same day charting = 15 min

## 2022-11-13 ENCOUNTER — Ambulatory Visit: Payer: Self-pay

## 2022-11-13 DIAGNOSIS — G4733 Obstructive sleep apnea (adult) (pediatric): Secondary | ICD-10-CM | POA: Diagnosis not present

## 2022-11-13 DIAGNOSIS — R278 Other lack of coordination: Secondary | ICD-10-CM | POA: Diagnosis not present

## 2022-11-13 DIAGNOSIS — M6281 Muscle weakness (generalized): Secondary | ICD-10-CM | POA: Diagnosis not present

## 2022-11-13 DIAGNOSIS — R2681 Unsteadiness on feet: Secondary | ICD-10-CM | POA: Diagnosis not present

## 2022-11-13 DIAGNOSIS — N184 Chronic kidney disease, stage 4 (severe): Secondary | ICD-10-CM | POA: Diagnosis not present

## 2022-11-13 NOTE — Patient Outreach (Signed)
  Care Coordination   Follow Up Visit Note   11/13/2022 Name: [name] MRN: [id number] DOB: Feb 07, 1941  [name] is a 82 y.o. year old female who sees Plotnikov, Georgina Quint, MD for primary care. I spoke with  [name] by phone today.  What matters to the patients health and wellness today?  [name] reports she is picking up her CPAP machine today. Pulmonology visit completed on 11/10/22. She reports spoke with pulmonology about upcoming beach trip and reports with CPAP machine will not need oxygen. She states she will follow up with [name] if any questions. She denies any questions or concerns at this time.   Goals Addressed             This Visit's Progress    Assist with maintaining health-continue to improve post rehab stay       Interventions Today    Flowsheet Row Most Recent Value  Chronic Disease   Chronic disease during today's visit Other  General Interventions   General Interventions Discussed/Reviewed General Interventions Reviewed  Education Interventions   Education Provided Provided Education  [reviewed pulmonologist instructions with patient from OV 11/10/22]  Provided Verbal Education On Medication, When to see the doctor  [advised to continue to take medications as prescribed, advised to contact providers as needed, discussed CPAP-expected to pick up today. patient to f/u with pulm if any questions/concerns.]  Pharmacy Interventions   Pharmacy Dicussed/Reviewed Pharmacy Topics Reviewed            SDOH assessments and interventions completed:  No  Care Coordination Interventions:  Yes, provided   Follow up plan: Follow up call scheduled for 01/15/23    Encounter Outcome:  Pt. Visit Completed   Sherry Sheriff, RN, MSN, BSN, CCM [facility] Care Coordinator [phone]

## 2022-11-13 NOTE — Patient Instructions (Signed)
Visit Information  Thank you for taking time to visit with me today. Please don't hesitate to contact me if I can be of assistance to you.   Following are the goals we discussed today:  Attend provider visits as scheduled Take medications as prescribed Contact your provider if any Health questions or concerns   Our next appointment is by telephone on 01/15/23 at 10:00 am  Please call the care guide team at [phone] if you need to cancel or reschedule your appointment.   If you are experiencing a Mental Health or Behavioral Health Crisis or need someone to talk to, please call the Suicide and Crisis Lifeline: 50  Kathyrn Sheriff, RN, MSN, BSN, CCM [facility] Care Coordinator [phone]

## 2022-11-14 DIAGNOSIS — R278 Other lack of coordination: Secondary | ICD-10-CM | POA: Diagnosis not present

## 2022-11-14 DIAGNOSIS — M6281 Muscle weakness (generalized): Secondary | ICD-10-CM | POA: Diagnosis not present

## 2022-11-14 DIAGNOSIS — Z9181 History of falling: Secondary | ICD-10-CM | POA: Diagnosis not present

## 2022-11-15 DIAGNOSIS — R278 Other lack of coordination: Secondary | ICD-10-CM | POA: Diagnosis not present

## 2022-11-15 DIAGNOSIS — M6281 Muscle weakness (generalized): Secondary | ICD-10-CM | POA: Diagnosis not present

## 2022-11-15 DIAGNOSIS — R2681 Unsteadiness on feet: Secondary | ICD-10-CM | POA: Diagnosis not present

## 2022-11-16 DIAGNOSIS — M6281 Muscle weakness (generalized): Secondary | ICD-10-CM | POA: Diagnosis not present

## 2022-11-16 DIAGNOSIS — R278 Other lack of coordination: Secondary | ICD-10-CM | POA: Diagnosis not present

## 2022-11-16 DIAGNOSIS — Z9181 History of falling: Secondary | ICD-10-CM | POA: Diagnosis not present

## 2022-11-16 NOTE — Telephone Encounter (Signed)
Patient has came in the office and seen Katie on 11/01/22 was given result's from sleep study. Patient also seen Dr.Wert on 11/13/22. Order was placed for CPAP and Titration was order.Nothing else further needed at this time.

## 2022-11-17 DIAGNOSIS — N184 Chronic kidney disease, stage 4 (severe): Secondary | ICD-10-CM | POA: Diagnosis not present

## 2022-11-20 DIAGNOSIS — M6281 Muscle weakness (generalized): Secondary | ICD-10-CM | POA: Diagnosis not present

## 2022-11-20 DIAGNOSIS — R278 Other lack of coordination: Secondary | ICD-10-CM | POA: Diagnosis not present

## 2022-11-20 DIAGNOSIS — R2681 Unsteadiness on feet: Secondary | ICD-10-CM | POA: Diagnosis not present

## 2022-11-21 DIAGNOSIS — Z9181 History of falling: Secondary | ICD-10-CM | POA: Diagnosis not present

## 2022-11-21 DIAGNOSIS — R278 Other lack of coordination: Secondary | ICD-10-CM | POA: Diagnosis not present

## 2022-11-21 DIAGNOSIS — M6281 Muscle weakness (generalized): Secondary | ICD-10-CM | POA: Diagnosis not present

## 2022-11-22 ENCOUNTER — Encounter (HOSPITAL_COMMUNITY): Payer: Medicare PPO

## 2022-11-22 ENCOUNTER — Telehealth: Payer: Self-pay

## 2022-11-22 DIAGNOSIS — Z6822 Body mass index (BMI) 22.0-22.9, adult: Secondary | ICD-10-CM | POA: Diagnosis not present

## 2022-11-22 DIAGNOSIS — R278 Other lack of coordination: Secondary | ICD-10-CM | POA: Diagnosis not present

## 2022-11-22 DIAGNOSIS — R2681 Unsteadiness on feet: Secondary | ICD-10-CM | POA: Diagnosis not present

## 2022-11-22 DIAGNOSIS — M6281 Muscle weakness (generalized): Secondary | ICD-10-CM | POA: Diagnosis not present

## 2022-11-22 DIAGNOSIS — M0579 Rheumatoid arthritis with rheumatoid factor of multiple sites without organ or systems involvement: Secondary | ICD-10-CM | POA: Diagnosis not present

## 2022-11-22 DIAGNOSIS — M1991 Primary osteoarthritis, unspecified site: Secondary | ICD-10-CM | POA: Diagnosis not present

## 2022-11-22 DIAGNOSIS — Z79899 Other long term (current) drug therapy: Secondary | ICD-10-CM | POA: Diagnosis not present

## 2022-11-22 NOTE — Telephone Encounter (Signed)
Patient had a medication refill request come in on [facility] for Leflunomide (Arava)  with a different dosage than in epic. I spoke with the patient and she said that she will be getting it refilled from her Rheumatologist from now on instead of our office.

## 2022-11-23 ENCOUNTER — Other Ambulatory Visit: Payer: Medicare PPO

## 2022-11-23 DIAGNOSIS — M6281 Muscle weakness (generalized): Secondary | ICD-10-CM | POA: Diagnosis not present

## 2022-11-23 DIAGNOSIS — R278 Other lack of coordination: Secondary | ICD-10-CM | POA: Diagnosis not present

## 2022-11-23 DIAGNOSIS — Z9181 History of falling: Secondary | ICD-10-CM | POA: Diagnosis not present

## 2022-11-24 ENCOUNTER — Encounter: Payer: Self-pay | Admitting: Internal Medicine

## 2022-11-24 DIAGNOSIS — I10 Essential (primary) hypertension: Secondary | ICD-10-CM

## 2022-11-24 DIAGNOSIS — J449 Chronic obstructive pulmonary disease, unspecified: Secondary | ICD-10-CM | POA: Diagnosis not present

## 2022-11-24 MED ORDER — LOSARTAN POTASSIUM 50 MG PO TABS
50.0000 mg | ORAL_TABLET | Freq: Every day | ORAL | 0 refills | Status: DC
Start: 2022-11-24 — End: 2022-12-10

## 2022-11-26 ENCOUNTER — Inpatient Hospital Stay (HOSPITAL_COMMUNITY)
Admission: EM | Admit: 2022-11-26 | Discharge: 2022-11-29 | DRG: 378 | Disposition: A | Discharge status: Home / Self Care | Payer: Medicare PPO | Attending: Internal Medicine | Admitting: Internal Medicine

## 2022-11-26 DIAGNOSIS — K2971 Gastritis, unspecified, with bleeding: Secondary | ICD-10-CM | POA: Diagnosis present

## 2022-11-26 DIAGNOSIS — M353 Polymyalgia rheumatica: Secondary | ICD-10-CM | POA: Diagnosis present

## 2022-11-26 DIAGNOSIS — D122 Benign neoplasm of ascending colon: Secondary | ICD-10-CM | POA: Diagnosis present

## 2022-11-26 DIAGNOSIS — Z9981 Dependence on supplemental oxygen: Secondary | ICD-10-CM

## 2022-11-26 DIAGNOSIS — Z9841 Cataract extraction status, right eye: Secondary | ICD-10-CM

## 2022-11-26 DIAGNOSIS — K625 Hemorrhage of anus and rectum: Secondary | ICD-10-CM | POA: Diagnosis not present

## 2022-11-26 DIAGNOSIS — Z79890 Hormone replacement therapy (postmenopausal): Secondary | ICD-10-CM

## 2022-11-26 DIAGNOSIS — Z8711 Personal history of peptic ulcer disease: Secondary | ICD-10-CM

## 2022-11-26 DIAGNOSIS — I10 Essential (primary) hypertension: Secondary | ICD-10-CM | POA: Diagnosis not present

## 2022-11-26 DIAGNOSIS — D126 Benign neoplasm of colon, unspecified: Secondary | ICD-10-CM | POA: Diagnosis not present

## 2022-11-26 DIAGNOSIS — Z66 Do not resuscitate: Secondary | ICD-10-CM | POA: Diagnosis present

## 2022-11-26 DIAGNOSIS — Z86711 Personal history of pulmonary embolism: Secondary | ICD-10-CM | POA: Diagnosis not present

## 2022-11-26 DIAGNOSIS — I2510 Atherosclerotic heart disease of native coronary artery without angina pectoris: Secondary | ICD-10-CM | POA: Diagnosis present

## 2022-11-26 DIAGNOSIS — N179 Acute kidney failure, unspecified: Secondary | ICD-10-CM | POA: Diagnosis present

## 2022-11-26 DIAGNOSIS — K449 Diaphragmatic hernia without obstruction or gangrene: Secondary | ICD-10-CM | POA: Diagnosis present

## 2022-11-26 DIAGNOSIS — I252 Old myocardial infarction: Secondary | ICD-10-CM

## 2022-11-26 DIAGNOSIS — E039 Hypothyroidism, unspecified: Secondary | ICD-10-CM | POA: Diagnosis present

## 2022-11-26 DIAGNOSIS — N184 Chronic kidney disease, stage 4 (severe): Secondary | ICD-10-CM | POA: Diagnosis present

## 2022-11-26 DIAGNOSIS — I5032 Chronic diastolic (congestive) heart failure: Secondary | ICD-10-CM | POA: Diagnosis present

## 2022-11-26 DIAGNOSIS — E785 Hyperlipidemia, unspecified: Secondary | ICD-10-CM | POA: Diagnosis present

## 2022-11-26 DIAGNOSIS — Z86010 Personal history of colonic polyps: Secondary | ICD-10-CM

## 2022-11-26 DIAGNOSIS — M069 Rheumatoid arthritis, unspecified: Secondary | ICD-10-CM | POA: Diagnosis present

## 2022-11-26 DIAGNOSIS — K5731 Diverticulosis of large intestine without perforation or abscess with bleeding: Principal | ICD-10-CM | POA: Diagnosis present

## 2022-11-26 DIAGNOSIS — Z951 Presence of aortocoronary bypass graft: Secondary | ICD-10-CM

## 2022-11-26 DIAGNOSIS — I130 Hypertensive heart and chronic kidney disease with heart failure and stage 1 through stage 4 chronic kidney disease, or unspecified chronic kidney disease: Secondary | ICD-10-CM | POA: Diagnosis present

## 2022-11-26 DIAGNOSIS — N289 Disorder of kidney and ureter, unspecified: Secondary | ICD-10-CM | POA: Diagnosis not present

## 2022-11-26 DIAGNOSIS — Z7901 Long term (current) use of anticoagulants: Secondary | ICD-10-CM | POA: Diagnosis not present

## 2022-11-26 DIAGNOSIS — D123 Benign neoplasm of transverse colon: Secondary | ICD-10-CM | POA: Diagnosis present

## 2022-11-26 DIAGNOSIS — K922 Gastrointestinal hemorrhage, unspecified: Secondary | ICD-10-CM | POA: Diagnosis present

## 2022-11-26 DIAGNOSIS — R58 Hemorrhage, not elsewhere classified: Secondary | ICD-10-CM | POA: Diagnosis not present

## 2022-11-26 DIAGNOSIS — K2289 Other specified disease of esophagus: Secondary | ICD-10-CM | POA: Diagnosis present

## 2022-11-26 DIAGNOSIS — Z8679 Personal history of other diseases of the circulatory system: Secondary | ICD-10-CM

## 2022-11-26 DIAGNOSIS — Z83438 Family history of other disorder of lipoprotein metabolism and other lipidemia: Secondary | ICD-10-CM

## 2022-11-26 DIAGNOSIS — Z8249 Family history of ischemic heart disease and other diseases of the circulatory system: Secondary | ICD-10-CM

## 2022-11-26 DIAGNOSIS — Z87891 Personal history of nicotine dependence: Secondary | ICD-10-CM

## 2022-11-26 DIAGNOSIS — Z885 Allergy status to narcotic agent status: Secondary | ICD-10-CM

## 2022-11-26 DIAGNOSIS — J449 Chronic obstructive pulmonary disease, unspecified: Secondary | ICD-10-CM | POA: Diagnosis present

## 2022-11-26 DIAGNOSIS — Z86718 Personal history of other venous thrombosis and embolism: Secondary | ICD-10-CM

## 2022-11-26 DIAGNOSIS — K921 Melena: Secondary | ICD-10-CM | POA: Diagnosis not present

## 2022-11-26 DIAGNOSIS — Z9842 Cataract extraction status, left eye: Secondary | ICD-10-CM

## 2022-11-26 DIAGNOSIS — Z96642 Presence of left artificial hip joint: Secondary | ICD-10-CM | POA: Diagnosis present

## 2022-11-26 DIAGNOSIS — K648 Other hemorrhoids: Secondary | ICD-10-CM | POA: Diagnosis present

## 2022-11-26 DIAGNOSIS — K5730 Diverticulosis of large intestine without perforation or abscess without bleeding: Secondary | ICD-10-CM | POA: Diagnosis not present

## 2022-11-26 DIAGNOSIS — Z79899 Other long term (current) drug therapy: Secondary | ICD-10-CM

## 2022-11-26 DIAGNOSIS — Z823 Family history of stroke: Secondary | ICD-10-CM

## 2022-11-26 DIAGNOSIS — J432 Centrilobular emphysema: Secondary | ICD-10-CM | POA: Diagnosis not present

## 2022-11-26 DIAGNOSIS — K2970 Gastritis, unspecified, without bleeding: Secondary | ICD-10-CM | POA: Diagnosis not present

## 2022-11-26 DIAGNOSIS — Z9081 Acquired absence of spleen: Secondary | ICD-10-CM

## 2022-11-26 DIAGNOSIS — Z8673 Personal history of transient ischemic attack (TIA), and cerebral infarction without residual deficits: Secondary | ICD-10-CM

## 2022-11-26 DIAGNOSIS — D631 Anemia in chronic kidney disease: Secondary | ICD-10-CM | POA: Diagnosis present

## 2022-11-26 DIAGNOSIS — H449 Unspecified disorder of globe: Secondary | ICD-10-CM | POA: Diagnosis not present

## 2022-11-26 DIAGNOSIS — Z833 Family history of diabetes mellitus: Secondary | ICD-10-CM

## 2022-11-26 DIAGNOSIS — D649 Anemia, unspecified: Secondary | ICD-10-CM | POA: Diagnosis not present

## 2022-11-26 DIAGNOSIS — Z886 Allergy status to analgesic agent status: Secondary | ICD-10-CM

## 2022-11-26 DIAGNOSIS — J9601 Acute respiratory failure with hypoxia: Secondary | ICD-10-CM | POA: Diagnosis not present

## 2022-11-26 NOTE — ED Triage Notes (Signed)
From home. C/O rectal bleeding that only happens early Am and last PM. During the day she is fine.

## 2022-11-27 ENCOUNTER — Other Ambulatory Visit: Payer: Self-pay

## 2022-11-27 ENCOUNTER — Encounter (HOSPITAL_COMMUNITY): Payer: Self-pay

## 2022-11-27 ENCOUNTER — Inpatient Hospital Stay (HOSPITAL_COMMUNITY): Payer: Medicare PPO

## 2022-11-27 DIAGNOSIS — K922 Gastrointestinal hemorrhage, unspecified: Secondary | ICD-10-CM | POA: Diagnosis present

## 2022-11-27 DIAGNOSIS — J432 Centrilobular emphysema: Secondary | ICD-10-CM

## 2022-11-27 DIAGNOSIS — K648 Other hemorrhoids: Secondary | ICD-10-CM | POA: Diagnosis present

## 2022-11-27 DIAGNOSIS — D631 Anemia in chronic kidney disease: Secondary | ICD-10-CM | POA: Diagnosis present

## 2022-11-27 DIAGNOSIS — Z9981 Dependence on supplemental oxygen: Secondary | ICD-10-CM | POA: Diagnosis not present

## 2022-11-27 DIAGNOSIS — Z7901 Long term (current) use of anticoagulants: Secondary | ICD-10-CM

## 2022-11-27 DIAGNOSIS — Z66 Do not resuscitate: Secondary | ICD-10-CM | POA: Diagnosis present

## 2022-11-27 DIAGNOSIS — E039 Hypothyroidism, unspecified: Secondary | ICD-10-CM | POA: Diagnosis present

## 2022-11-27 DIAGNOSIS — K921 Melena: Secondary | ICD-10-CM | POA: Diagnosis not present

## 2022-11-27 DIAGNOSIS — K2289 Other specified disease of esophagus: Secondary | ICD-10-CM | POA: Diagnosis present

## 2022-11-27 DIAGNOSIS — K449 Diaphragmatic hernia without obstruction or gangrene: Secondary | ICD-10-CM | POA: Diagnosis not present

## 2022-11-27 DIAGNOSIS — I130 Hypertensive heart and chronic kidney disease with heart failure and stage 1 through stage 4 chronic kidney disease, or unspecified chronic kidney disease: Secondary | ICD-10-CM | POA: Diagnosis present

## 2022-11-27 DIAGNOSIS — Z86711 Personal history of pulmonary embolism: Secondary | ICD-10-CM | POA: Diagnosis not present

## 2022-11-27 DIAGNOSIS — Z79890 Hormone replacement therapy (postmenopausal): Secondary | ICD-10-CM | POA: Diagnosis not present

## 2022-11-27 DIAGNOSIS — M353 Polymyalgia rheumatica: Secondary | ICD-10-CM | POA: Diagnosis present

## 2022-11-27 DIAGNOSIS — K5730 Diverticulosis of large intestine without perforation or abscess without bleeding: Secondary | ICD-10-CM | POA: Diagnosis not present

## 2022-11-27 DIAGNOSIS — D122 Benign neoplasm of ascending colon: Secondary | ICD-10-CM | POA: Diagnosis present

## 2022-11-27 DIAGNOSIS — Z96642 Presence of left artificial hip joint: Secondary | ICD-10-CM | POA: Diagnosis present

## 2022-11-27 DIAGNOSIS — Z87891 Personal history of nicotine dependence: Secondary | ICD-10-CM | POA: Diagnosis not present

## 2022-11-27 DIAGNOSIS — I5032 Chronic diastolic (congestive) heart failure: Secondary | ICD-10-CM | POA: Diagnosis present

## 2022-11-27 DIAGNOSIS — M069 Rheumatoid arthritis, unspecified: Secondary | ICD-10-CM | POA: Diagnosis present

## 2022-11-27 DIAGNOSIS — K2970 Gastritis, unspecified, without bleeding: Secondary | ICD-10-CM | POA: Diagnosis not present

## 2022-11-27 DIAGNOSIS — J449 Chronic obstructive pulmonary disease, unspecified: Secondary | ICD-10-CM | POA: Diagnosis present

## 2022-11-27 DIAGNOSIS — N184 Chronic kidney disease, stage 4 (severe): Secondary | ICD-10-CM | POA: Diagnosis present

## 2022-11-27 DIAGNOSIS — D123 Benign neoplasm of transverse colon: Secondary | ICD-10-CM | POA: Diagnosis present

## 2022-11-27 DIAGNOSIS — I2510 Atherosclerotic heart disease of native coronary artery without angina pectoris: Secondary | ICD-10-CM | POA: Diagnosis not present

## 2022-11-27 DIAGNOSIS — K5731 Diverticulosis of large intestine without perforation or abscess with bleeding: Secondary | ICD-10-CM | POA: Diagnosis present

## 2022-11-27 DIAGNOSIS — I10 Essential (primary) hypertension: Secondary | ICD-10-CM

## 2022-11-27 DIAGNOSIS — K2971 Gastritis, unspecified, with bleeding: Secondary | ICD-10-CM | POA: Diagnosis present

## 2022-11-27 DIAGNOSIS — E785 Hyperlipidemia, unspecified: Secondary | ICD-10-CM | POA: Diagnosis present

## 2022-11-27 DIAGNOSIS — D126 Benign neoplasm of colon, unspecified: Secondary | ICD-10-CM | POA: Diagnosis not present

## 2022-11-27 DIAGNOSIS — Z951 Presence of aortocoronary bypass graft: Secondary | ICD-10-CM | POA: Diagnosis not present

## 2022-11-27 DIAGNOSIS — N179 Acute kidney failure, unspecified: Secondary | ICD-10-CM | POA: Diagnosis present

## 2022-11-27 LAB — TYPE AND SCREEN
ABO/RH(D): O POS
Antibody Screen: NEGATIVE

## 2022-11-27 LAB — CBC WITH DIFFERENTIAL/PLATELET
Abs Immature Granulocytes: 0.03 10*3/uL (ref 0.00–0.07)
Basophils Absolute: 0.2 10*3/uL — ABNORMAL HIGH (ref 0.0–0.1)
Basophils Relative: 2 %
Eosinophils Absolute: 0.4 10*3/uL (ref 0.0–0.5)
Eosinophils Relative: 5 %
HCT: 32 % — ABNORMAL LOW (ref 36.0–46.0)
Hemoglobin: 10 g/dL — ABNORMAL LOW (ref 12.0–15.0)
Immature Granulocytes: 0 %
Lymphocytes Relative: 24 %
Lymphs Abs: 2 10*3/uL (ref 0.7–4.0)
MCH: 31.6 pg (ref 26.0–34.0)
MCHC: 31.3 g/dL (ref 30.0–36.0)
MCV: 101.3 fL — ABNORMAL HIGH (ref 80.0–100.0)
Monocytes Absolute: 1.4 10*3/uL — ABNORMAL HIGH (ref 0.1–1.0)
Monocytes Relative: 17 %
Neutro Abs: 4.4 10*3/uL (ref 1.7–7.7)
Neutrophils Relative %: 52 %
Platelets: 227 10*3/uL (ref 150–400)
RBC: 3.16 MIL/uL — ABNORMAL LOW (ref 3.87–5.11)
RDW: 16.4 % — ABNORMAL HIGH (ref 11.5–15.5)
WBC: 8.5 10*3/uL (ref 4.0–10.5)
nRBC: 0 % (ref 0.0–0.2)

## 2022-11-27 LAB — BASIC METABOLIC PANEL
Anion gap: 12 (ref 5–15)
Anion gap: 8 (ref 5–15)
BUN: 80 mg/dL — ABNORMAL HIGH (ref 8–23)
BUN: 81 mg/dL — ABNORMAL HIGH (ref 8–23)
CO2: 24 mmol/L (ref 22–32)
CO2: 25 mmol/L (ref 22–32)
Calcium: 9.6 mg/dL (ref 8.9–10.3)
Calcium: 9.5 mg/dL (ref 8.9–10.3)
Chloride: 102 mmol/L (ref 98–111)
Chloride: 104 mmol/L (ref 98–111)
Creatinine, Ser: 3.67 mg/dL — ABNORMAL HIGH (ref 0.44–1.00)
Creatinine, Ser: 3.62 mg/dL — ABNORMAL HIGH (ref 0.44–1.00)
GFR, Estimated: 12 mL/min — ABNORMAL LOW (ref >60)
GFR, Estimated: 12 mL/min — ABNORMAL LOW (ref >60)
Glucose, Bld: 110 mg/dL — ABNORMAL HIGH (ref 70–99)
Glucose, Bld: 100 mg/dL — ABNORMAL HIGH (ref 70–99)
Potassium: 4.3 mmol/L (ref 3.5–5.1)
Potassium: 4.4 mmol/L (ref 3.5–5.1)
Sodium: 138 mmol/L (ref 135–145)
Sodium: 137 mmol/L (ref 135–145)

## 2022-11-27 LAB — HEMATOCRIT
HCT: 31.3 % — ABNORMAL LOW (ref 36.0–46.0)
HCT: 30.9 % — ABNORMAL LOW (ref 36.0–46.0)
HCT: 29.6 % — ABNORMAL LOW (ref 36.0–46.0)
HCT: 32.2 % — ABNORMAL LOW (ref 36.0–46.0)

## 2022-11-27 LAB — HEMOGLOBIN
Hemoglobin: 9.8 g/dL — ABNORMAL LOW (ref 12.0–15.0)
Hemoglobin: 10 g/dL — ABNORMAL LOW (ref 12.0–15.0)
Hemoglobin: 9.3 g/dL — ABNORMAL LOW (ref 12.0–15.0)
Hemoglobin: 10.2 g/dL — ABNORMAL LOW (ref 12.0–15.0)

## 2022-11-27 MED ORDER — LEVOTHYROXINE SODIUM 88 MCG PO TABS
88.0000 ug | ORAL_TABLET | Freq: Every day | ORAL | Status: DC
  Administered 2022-11-27 – 2022-11-29 (×2): 88 ug via ORAL
  Filled 2022-11-27 (×3): qty 1

## 2022-11-27 MED ORDER — ACETAMINOPHEN 325 MG PO TABS: 650.0000 mg | ORAL_TABLET | Freq: Four times a day (QID) | ORAL | Status: DC | PRN

## 2022-11-27 MED ORDER — CARVEDILOL 6.25 MG PO TABS
6.2500 mg | ORAL_TABLET | Freq: Two times a day (BID) | ORAL | Status: DC
  Administered 2022-11-27 – 2022-11-29 (×5): 6.25 mg via ORAL
  Filled 2022-11-27 (×4): qty 1
  Filled 2022-11-27: qty 2

## 2022-11-27 MED ORDER — CLONIDINE HCL 0.2 MG PO TABS
0.2000 mg | ORAL_TABLET | ORAL | Status: DC | PRN
  Administered 2022-11-27 – 2022-11-29 (×4): 0.2 mg via ORAL
  Filled 2022-11-27 (×5): qty 1

## 2022-11-27 MED ORDER — PANTOPRAZOLE SODIUM 40 MG PO TBEC
40.0000 mg | DELAYED_RELEASE_TABLET | Freq: Two times a day (BID) | ORAL | Status: DC
  Administered 2022-11-27 – 2022-11-29 (×4): 40 mg via ORAL
  Filled 2022-11-27 (×5): qty 1

## 2022-11-27 MED ORDER — ACETAMINOPHEN 650 MG RE SUPP: 650.0000 mg | Freq: Four times a day (QID) | RECTAL | Status: DC | PRN

## 2022-11-27 MED ORDER — ADULT MULTIVITAMIN LIQUID CH
Freq: Every day | ORAL | Status: DC
  Administered 2022-11-27 – 2022-11-29 (×2): 15 mL via ORAL
  Filled 2022-11-27 (×3): qty 15

## 2022-11-27 MED ORDER — ONDANSETRON HCL 4 MG PO TABS: 4.0000 mg | ORAL_TABLET | Freq: Four times a day (QID) | ORAL | Status: DC | PRN

## 2022-11-27 MED ORDER — LEFLUNOMIDE 10 MG PO TABS
10.0000 mg | ORAL_TABLET | Freq: Every day | ORAL | Status: DC
  Administered 2022-11-27 – 2022-11-29 (×3): 10 mg via ORAL
  Filled 2022-11-27 (×3): qty 1

## 2022-11-27 MED ORDER — VITAMIN B-12 1000 MCG PO TABS
2500.0000 ug | ORAL_TABLET | Freq: Every day | ORAL | Status: DC
  Administered 2022-11-27 – 2022-11-29 (×3): 2500 ug via ORAL
  Filled 2022-11-27 (×3): qty 3

## 2022-11-27 MED ORDER — ONDANSETRON HCL 4 MG/2ML IJ SOLN
4.0000 mg | Freq: Four times a day (QID) | INTRAMUSCULAR | Status: DC | PRN
  Administered 2022-11-28: 4 mg via INTRAVENOUS
  Filled 2022-11-27: qty 2

## 2022-11-27 MED ORDER — SODIUM CHLORIDE 0.9 % IV SOLN: INTRAVENOUS | Status: AC

## 2022-11-27 MED ORDER — ALBUTEROL SULFATE (2.5 MG/3ML) 0.083% IN NEBU: 2.5000 mg | INHALATION_SOLUTION | RESPIRATORY_TRACT | Status: DC | PRN

## 2022-11-27 MED ORDER — SERTRALINE HCL 50 MG PO TABS
25.0000 mg | ORAL_TABLET | Freq: Every day | ORAL | Status: DC
  Administered 2022-11-27 – 2022-11-29 (×3): 25 mg via ORAL
  Filled 2022-11-27 (×3): qty 1

## 2022-11-27 MED ORDER — PEG-KCL-NACL-NASULF-NA ASC-C 100 G PO SOLR
0.5000 | Freq: Once | ORAL | Status: AC
  Administered 2022-11-27: 100 g via ORAL
  Filled 2022-11-27: qty 1

## 2022-11-27 MED ORDER — FEXOFENADINE HCL 180 MG PO TABS
180.0000 mg | ORAL_TABLET | Freq: Every day | ORAL | Status: DC
  Filled 2022-11-27: qty 1

## 2022-11-27 MED ORDER — PEG-KCL-NACL-NASULF-NA ASC-C 100 G PO SOLR
0.5000 | Freq: Once | ORAL | Status: AC
  Administered 2022-11-28: 100 g via ORAL

## 2022-11-27 MED ORDER — TECHNETIUM TC 99M-LABELED RED BLOOD CELLS IV KIT
23.5000 | PACK | Freq: Once | INTRAVENOUS | Status: AC | PRN
  Administered 2022-11-27: 23.5 via INTRAVENOUS

## 2022-11-27 MED ORDER — PANTOPRAZOLE SODIUM 40 MG IV SOLR: 40.0000 mg | Freq: Two times a day (BID) | INTRAVENOUS | Status: DC

## 2022-11-27 MED ORDER — PEG-KCL-NACL-NASULF-NA ASC-C 100 G PO SOLR: 1.0000 | Freq: Once | ORAL | Status: DC

## 2022-11-27 MED ORDER — SODIUM CHLORIDE 0.9% FLUSH
3.0000 mL | Freq: Two times a day (BID) | INTRAVENOUS | Status: DC
  Administered 2022-11-27 – 2022-11-29 (×6): 3 mL via INTRAVENOUS

## 2022-11-27 MED ORDER — ATORVASTATIN CALCIUM 80 MG PO TABS
80.0000 mg | ORAL_TABLET | Freq: Every day | ORAL | Status: DC
  Administered 2022-11-27 – 2022-11-29 (×3): 80 mg via ORAL
  Filled 2022-11-27 (×3): qty 1

## 2022-11-27 NOTE — H&P (View-Only) (Signed)
Consultation  Referring Provider:   [facility] Primary Care Physician:  [name], MD Primary Gastroenterologist:  Dr. Adela Lank       Reason for Consultation:     hematochezia DOA: 11/26/2022         Hospital Day: 2         HPI:   [name] is a 82 y.o. female with past medical history significant for AAA repair 96, CAD status post CABG in 1996, celiac artery stenosis, CKD IV, COPD, history of TIA/CVA , Hx of PE on Eliquis, RA, diverticulosis, diverticular hemorrhage, adenomatous colon polyps. March had urosepsis ecoli and AKI on CKD.  She is on supplemental oxygen at night and CPAP, she had an echo in October showing grade 2 diastolic dysfunction with normal EF.  She is on eliquis, last dose yesterday AM.  Presents to the ER with painless hematochezia..  Work up notable for hemoglobin stable, BUN 80, creatinine 3.67, previously 3.3 creatinine and BUN of 60  hemoglobin 10, baseline appears to be between 10 and 11, hemoglobin this morning 10.  2017 colonoscopy with Dr. Adela Lank for loose stools/fecal incontience showed multiple polyps, medium mouthed diverticula left colon, internal hemorrhoids.  Path showed acute to thoroughly completely sessile serrated polyp invading her appendiceal orifice and had surgical resection of that.  She has not wanted surveillance colonoscopy since that time.  07/08/2022 EGD for melena - Esophagogastric landmarks identified. - 4 cm hiatal hernia. - LA Grade A esophagitis. - Normal esophagus otherwise. - 10 gastric ulcers as noted above - none with stigmata for bleeding - Mucosal changes in the stomach. Biopsied. - Non- bleeding duodenal ulcer with pigmented material. Path negative H pylori  She states she has a Retacrit infusion scheduled by her nephrologist.  Patient has had diverticular bleed in the past, states this is very similar but feels worse when she previously had. Yesterday started with urge to have a bowel movement, had  bright red bloody/dark blood, painless.  States now anytime she goes to urinate she will have bright red stools, last bowel movement was actually yesterday but she had 2 episodes last night blood per rectum with urination.  Has not had anything this morning but does feel like she may need to go to the restroom at this time. No fever, chills. No nausea, vomiting, AB pain.  No GERD She has lost about 30 lbs from April after she was hospitalized with urosepsis, states she has no appetite.  She is still on protonix, due to age and comorbidities patient did not want any further screening EGDs after last hospitalization. No NSAIDS. No ETOH, no smoking history, no drug use.   Abnormal ED labs: Abnormal Labs Reviewed  BASIC METABOLIC PANEL - Abnormal; Notable for the following components:      Result Value   Glucose, Bld 110 (*)    BUN 80 (*)    Creatinine, Ser 3.67 (*)    GFR, Estimated 12 (*)    All other components within normal limits  CBC WITH DIFFERENTIAL/PLATELET - Abnormal; Notable for the following components:   RBC 3.16 (*)    Hemoglobin 10.0 (*)    HCT 32.0 (*)    MCV 101.3 (*)    RDW 16.4 (*)    Monocytes Absolute 1.4 (*)    Basophils Absolute 0.2 (*)    All other components within normal limits  BASIC METABOLIC PANEL - Abnormal; Notable for the following components:   Glucose, Bld 100 (*)  BUN 81 (*)    Creatinine, Ser 3.62 (*)    GFR, Estimated 12 (*)    All other components within normal limits  HEMOGLOBIN - Abnormal; Notable for the following components:   Hemoglobin 9.8 (*)    All other components within normal limits  HEMATOCRIT - Abnormal; Notable for the following components:   HCT 31.3 (*)    All other components within normal limits    Past Medical History:  Diagnosis Date   Abdominal aortic aneurysm (HCC)    REPAIRED IN 1996 BY [name]  AND HAS RECENTLY BEEN FOLLOWED BY [name]   Acquired asplenia     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 Most likely scar. Mild subendocardial ischemia. EF 71% LOW RISK.    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 Kellie Simmering   Pulmonary emboli [facility]) 02/06/2022   01/2022 PE/DVT post-op. Lower GI bleed     Treat edema  Start Eliquis at the reduced dose due to Creat>1.5 and age >67   Rheumatoid arthritis (HCC) 2011   Dr.Truslow; fracture knees, hands and wrists -    S/P CABG x 1 1996   CABG--LIMA-LAD for ostial LAD (not felt to be PCI amenable). EF NORMAL then; LIMA now atretic   Shortness of breath dyspnea    with exertion   Stroke (HCC) 11/2014   TIA    Urinary frequency     Surgical History:  She  has a past surgical history that includes Splenectomy; Cardiac catheterization (2005); Cervical spine surgery; Carpal tunnel release (Left); Cataract extraction (Bilateral); Bunionectomy (07/2011); Coronary artery bypass graft (1996); Abdominal aortic aneurysm repair (1996); NM MYOVIEW LTD (06/2010); vagina polyp; Video bronchoscopy (N/A, 03/02/2015); Video assisted thoracoscopy (vats)/wedge resection (Left,  03/02/2015); transthoracic echocardiogram (12/2014); Inguinal hernia repair (Right); laparoscopic appendectomy (N/A, 06/28/2016); Appendectomy; Video bronchoscopy with endobronchial navigation (N/A, 10/08/2017); Colonoscopy; Total hip arthroplasty (Left, 01/23/2022); Esophagogastroduodenoscopy (egd) with propofol (N/A, 07/08/2022); and biopsy (07/08/2022). Family History:  Her family history includes Cancer in her paternal uncle; Coronary artery disease in an other family member; Diabetes in her mother; Heart disease in her mother; Hyperlipidemia in her mother, sister, son, son, and son; Hypertension in her daughter, mother, sister, son, son, son, and another family member; Stroke in her father. Social History:   reports that she quit smoking about 64 years ago. Her smoking use included cigarettes. She has never been exposed to tobacco smoke. She has  never used smokeless tobacco. She reports current alcohol use. She reports that she does not use drugs.  Prior to Admission medications   Medication Sig Start Date End Date Taking? Authorizing Provider  acetaminophen (TYLENOL) 325 MG tablet Take 2 tablets (650 mg total) by mouth 2 (two) times daily as needed (generalized pain). 08/24/22   Mast, Man X, NP  albuterol (VENTOLIN HFA) 108 (90 Base) MCG/ACT inhaler Inhale 1-2 puffs into the lungs every 4 (four) hours as needed for wheezing or shortness of breath. 03/27/22   Mast, Man X, NP  apixaban (ELIQUIS) 2.5 MG TABS tablet Take 1 tablet (2.5 mg total) by mouth 2 (two) times daily. 08/24/22   Mast, Man X, NP  ascorbic acid (VITAMIN C) 500 MG tablet Take 1 tablet (500 mg total) by mouth daily. 03/27/22   Mast, Man X, NP  atorvastatin (LIPITOR) 80 MG tablet Take 1 tablet (80 mg total) by mouth daily. 08/24/22   Mast, Man X, NP  carvedilol (COREG) 6.25 MG tablet Take 6.25 mg by mouth 2 (two) times daily with a meal.    [provider]  Cholecalciferol (VITAMIN D3) 50 MCG (2000 UT) capsule Take 1 capsule (2,000  Units total) by mouth daily. 03/27/22   Mast, Man X, NP  cloNIDine (CATAPRES - DOSED IN MG/24 HR) 0.1 mg/24hr patch Place 1 patch (0.1 mg total) onto the skin once a week. Patient taking differently: Place 0.2 mg onto the skin once a week. 10/19/22   Plotnikov, Georgina Quint, MD  cloNIDine (CATAPRES) 0.2 MG tablet Take 1 tablet (0.2 mg total) by mouth 4 (four) times daily as needed for up to 30 doses. For systolic blood pressure (top number) over 200 mmhg 10/19/22   Plotnikov, Georgina Quint, MD  clotrimazole-betamethasone (LOTRISONE) cream Apply 1 Application topically 2 (two) times daily. Mouth corner rash 09/05/22 09/05/23  Plotnikov, Georgina Quint, MD  Cyanocobalamin 2500 MCG TABS Take 2,500 mcg by mouth daily. 08/24/22   Mast, Man X, NP  docusate sodium (COLACE) 100 MG capsule Take 1-2 capsules (100-200 mg total) by mouth daily as needed for mild constipation. 08/24/22   Mast, Man X, NP  Fexofenadine HCl (ALLERGY 24-HR PO) Take 1 tablet by mouth in the morning and at bedtime. OTC - 12 hour    [provider]  Golimumab 50 MG/0.5ML SOAJ Take 50 mg by mouth every 2 (two) months. Hold for the next 2 weeks per Orthopedic surgery recommendations. 03/27/22   Mast, Man X, NP  leflunomide (ARAVA) 20 MG tablet Take 1 tablet (20 mg total) by mouth daily. Patient taking differently: Take 10 mg by mouth daily. 08/24/22   Mast, Man X, NP  levothyroxine (SYNTHROID) 88 MCG tablet Take 1 tablet (88 mcg total) by mouth daily before breakfast. 08/24/22   Mast, Man X, NP  losartan (COZAAR) 50 MG tablet Take 1 tablet (50 mg total) by mouth daily. Keep Aug appt for future refills 11/24/22   Plotnikov, Georgina Quint, MD  Multiple Vitamins-Minerals (CENTRUM ADULT PO) Take 1 tablet by mouth daily. 1 Tablet Daily.    [provider]  pantoprazole (PROTONIX) 40 MG tablet Take 1 tablet (40 mg total) by mouth daily. 08/24/22   Mast, Man X, NP  polyethylene glycol (MIRALAX / GLYCOLAX) 17 g packet Take 17 g by mouth daily as needed.  08/24/22   Mast, Man X, NP  sertraline (ZOLOFT) 50 MG tablet Take 1 tablet (50 mg total) by mouth daily. Patient taking differently: Take 25 mg by  mouth daily. 10/19/22   Plotnikov, Georgina Quint, MD  triamcinolone (NASACORT) 55 MCG/ACT AERO nasal inhaler Place 1 spray into the nose daily. 08/24/22   Mast, Man X, NP    Current Facility-Administered Medications  Medication Dose Route Frequency Provider Last Rate Last Admin   0.9 %  sodium chloride infusion   Intravenous Continuous Opyd, Lavone Neri, MD 100 mL/hr at 11/27/22 0331 New Bag at 11/27/22 0331   acetaminophen (TYLENOL) tablet 650 mg  650 mg Oral Q6H PRN Opyd, Lavone Neri, MD       Or   acetaminophen (TYLENOL) suppository 650 mg  650 mg Rectal Q6H PRN Opyd, Lavone Neri, MD       albuterol (PROVENTIL) (2.5 MG/3ML) 0.083% nebulizer solution 2.5 mg  2.5 mg Inhalation Q4H PRN Opyd, Lavone Neri, MD       atorvastatin (LIPITOR) tablet 80 mg  80 mg Oral Daily Opyd, Lavone Neri, MD       carvedilol (COREG) tablet 6.25 mg  6.25 mg Oral BID Opyd, Lavone Neri, MD   6.25 mg at 11/27/22 0602   cloNIDine (CATAPRES) tablet 0.2 mg  0.2 mg Oral Q4H PRN Opyd, Lavone Neri, MD   0.2 mg at 11/27/22 0650   leflunomide (ARAVA) tablet 10 mg  10 mg Oral Daily Opyd, Lavone Neri, MD       levothyroxine (SYNTHROID) tablet 88 mcg  88 mcg Oral QAC breakfast Opyd, Lavone Neri, MD       ondansetron (ZOFRAN) tablet 4 mg  4 mg Oral Q6H PRN Opyd, Lavone Neri, MD       Or   ondansetron (ZOFRAN) injection 4 mg  4 mg Intravenous Q6H PRN Opyd, Lavone Neri, MD       pantoprazole (PROTONIX) EC tablet 40 mg  40 mg Oral BID Opyd, Lavone Neri, MD       sertraline (ZOLOFT) tablet 25 mg  25 mg Oral Daily Opyd, Lavone Neri, MD       sodium chloride flush (NS) 0.9 % injection 3 mL  3 mL Intravenous Q12H Opyd, Lavone Neri, MD   3 mL at 11/27/22 0330    Allergies as of 11/26/2022 - Review Complete 11/11/2022  Allergen Reaction Noted   Nsaids Other (See Comments) 01/16/2022   Aspirin Other (See Comments)     Codeine Nausea And Vomiting    Nitrostat [nitroglycerin] Other (See Comments)     Review of Systems:    Constitutional: No weight loss, fever, chills, weakness or fatigue HEENT: Eyes: No change in vision               Ears, Nose, Throat:  No change in hearing or congestion Skin: No rash or itching Cardiovascular: No chest pain, chest pressure or palpitations   Respiratory: No SOB or cough Gastrointestinal: See HPI and otherwise negative Genitourinary: No dysuria or change in urinary frequency Neurological: No headache, dizziness or syncope Musculoskeletal: No new muscle or joint pain Hematologic: No bleeding or bruising Psychiatric: No history of depression or anxiety     Physical Exam:  Vital signs in last 24 hours: Temp:  [97.5 F (36.4 C)-97.8 F (36.6 C)] 97.5 F (36.4 C) (07/29 0500) Pulse Rate:  [67-81] 75 (07/29 0715) Resp:  [16-18] 16 (07/29 0715) BP: (163-252)/(59-88) 163/88 (07/29 0715) SpO2:  [91 %-99 %] 91 % (07/29 0715) Weight:  [61 kg] 61 kg (07/29 0004)   Last BM recorded by nurses in past 5 days No data recorded  General:   Pleasant, well developed  female in no acute distress Head:  Normocephalic and atraumatic. Eyes: sclerae anicteric,conjunctive pink  Heart:  regular rate and rhythm Pulm: Clear anteriorly; no wheezing Abdomen:  Soft, Obese AB, Active bowel sounds. No tenderness . Without guarding and Without rebound, No organomegaly appreciated. Extremities:  Without edema. Msk:  Symmetrical without gross deformities. Peripheral pulses intact.  Neurologic:  Alert and  oriented x4;  No focal deficits.  Skin:   Dry and intact without significant lesions or rashes. Psychiatric:  Cooperative. Normal mood and affect.  LAB RESULTS: Recent Labs    11/27/22 0105 11/27/22 0506  WBC 8.5  --   HGB 10.0* 9.8*  HCT 32.0* 31.3*  PLT 227  --    BMET Recent Labs    11/27/22 0105 11/27/22 0506  NA 138 137  K 4.3 4.4  CL 102 104  CO2 24 25  GLUCOSE 110*  100*  BUN 80* 81*  CREATININE 3.67* 3.62*  CALCIUM 9.6 9.5   LFT No results for input(s): "PROT", "ALBUMIN", "AST", "ALT", "ALKPHOS", "BILITOT", "BILIDIR", "IBILI" in the last 72 hours. PT/INR No results for input(s): "LABPROT", "INR" in the last 72 hours.  STUDIES: No results found.    Impression    Hematochezia without hemodynamic compromise In the setting of Eliquis last dose 07/20 8 in the morning WBC 8.5 HGB 10.0 MCV 101.3 Platelets 227 11/27/2022 BUN 81 Cr 3.62  11/08/2022 Iron 103 Ferritin 672 B12 >1500  Patient has had diverticular bleed in the past.  Patient has had recent EGD with several ulcers 10 to being stacked's negative H. pylori, treated with PPI twice daily which patient continues on.  Patient did have elevation of BUN from 60-80, however with HD stability, no significant H&H drop this points away from hematochezia secondary to upper GI bleed.  Is most likely represents diverticular bleed. Last colonoscopy 2017 Dr. Adela Lank for loose stools/fecal incontinence multiple polyps 1 being sessile serrated polyp invading appendiceal orifice surgical resection.  No further surveillance colonoscopies patient declined secondary to age and comorbid conditions  CKD stage IV GFR very low, likely 1 ovoid CTA  History of PE on Eliquis Last dose 7/20 8 in the morning  Chronic nocturnal hypoxia on oxygen and CPAP Just uses at night  Grade 2 diastolic dysfunction normal ejection fraction October 2023  CAD status post bypass 6, AAA status postrepair 96  Principal Problem:   Acute GI bleeding Active Problems:   Acquired hypothyroidism   Essential hypertension   Rheumatoid arthritis (HCC)   COPD (chronic obstructive pulmonary disease) (HCC)   CKD (chronic kidney disease) stage 4, GFR 15-29 ml/min (HCC) - baseline SCr 2.5. follows with Dr. Malen Gauze with [facility]   History of pulmonary embolism    LOS: 0 days     Plan   -Last colonoscopy was 2017, had several  polyps, one which was sessile serrated polyp invading appendiceal orifice status post surgical resection has not had repeat colonoscopy due to that time due to multiple comorbidities and age.  -Endoscopy 07/08/2022 with melena with 10 gastric ulcers negative H. pylori did not have repeat secondary again to age comorbid conditions Patient with painless hematochezia, has had diverticular bleeds in the past, with HD stability and stable hemoglobin I doubt this represents painless hematochezia from upper GI bleed. Will suggest proceeding with bleeding scan due to kidney function follow-up CTA with IR consultation if positive. -If patient has worsening hemoglobin, or continues to have hematochezia with negative bleeding scan, consider endoscopic evaluation with colonoscopy and  endoscopy, defer to Dr. [name] for further evaluation. --Continue to monitor H&H with transfusion as needed to maintain hemoglobin greater than 8 given cardiac history. -Continue to hold Eliquis at this time   Thank you for your kind consultation, we will continue to follow.   Doree Albee  11/27/2022, 9:57 AM

## 2022-11-27 NOTE — Progress Notes (Signed)
   11/27/22 1031  Assess: MEWS Score  Temp 97.6 F (36.4 C)  BP (!) 211/70  MAP (mmHg) 106  Pulse Rate 70  Resp 18  Level of Consciousness Alert  SpO2 93 %  O2 Device Room Air  Assess: MEWS Score  MEWS Temp 0  MEWS Systolic 2  MEWS Pulse 0  MEWS RR 0  MEWS LOC 0  MEWS Score 2  MEWS Score Color Yellow  Assess: if the MEWS score is Yellow or Red  Were vital signs accurate and taken at a resting state? Yes  MEWS guidelines implemented  Yes, yellow  Treat  MEWS Interventions Considered administering scheduled or prn medications/treatments as ordered  Take Vital Signs  Increase Vital Sign Frequency  Yellow: Q2hr x1, continue Q4hrs until patient remains green for 12hrs  Escalate  MEWS: Escalate Yellow: Discuss with charge nurse and consider notifying provider and/or RRT  Notify: Charge Nurse/RN  Name of Charge Nurse/RN Notified Jill, RN  Assess: SIRS CRITERIA  SIRS Temperature  0  SIRS Pulse 0  SIRS Respirations  0  SIRS WBC 0  SIRS Score Sum  0

## 2022-11-27 NOTE — Progress Notes (Signed)
Patient seen and examined personally, I reviewed the chart, history and physical and admission note, done by admitting physician this morning and agree with the same with following addendum.  Please refer to the morning admission note for more detailed plan of care.  Briefly,  82 y.o.f w/ RA,hypertension,hypothyroidism,CAD s/p CABG,CKD stage IV, PUD, and diverticulosis who presented to the ED with rectal bleeding x3 days of bright red and maroon blood per rectum. There has not been any abdominal pain, nausea, or vomiting associated with this.  She has had multiple episodes where she feels the need to move her bowels but then passes mostly red and maroon blood.  She initially thought this was due to hemorrhoids but was having increased volume of bleeding last night which prompted her presentation.  No NSAID use history.  In the ED afebrile vital stable-BUN 80, creatinine 3.67, and hemoglobin 10.0. Type and screen was performed in the ED and secure chat was sent to Dr. Elnoria Howard of GI with request for routine consultation.     Seen and examined this am She reports around 7 AM this morning she had another painless bleeding rectally  Overnight BP down to 160s, labs with creatinine about the same hemoglobin 9.8 g  A/P: GI bleeding w/ painless hematochezia:   She has PUD and BUN is significantly increased but creatinine is also up, there is no upper GI sx-likely diverticular. holding Eliquis. GI consulted-sent message to Dr. Lance Coon. [name] this morning continue to trend HB Recent Labs  Lab 11/27/22 0105 11/27/22 0506  HGB 10.0* 9.8*  HCT 32.0* 31.3*     AKI superimposed on CKD IV: Creatinine about the same slightly worse than baseline continue to monitor.  Doing about nonproductive medication , CONT IVF Recent Labs    07/23/22 0645 07/24/22 1009 07/25/22 0533 08/01/22 0800 08/02/22 0918 08/02/22 0927 09/05/22 1155 09/30/22 1849 10/04/22 1300 11/27/22 0105 11/27/22 0506  BUN 63* 65* 62*  57* 56* 56* 60* 61* 49* 80* 81*  CREATININE 3.95* 3.84* 3.71* 3.3* 3.35* 3.60* 3.46* 3.30* 3.11* 3.67* 3.62*  CO2 22 21* 21* 25* 23  --  32 21* 22 24 25       CAD:No angina.Continue Lipitor  Hypertension:SBP sustaining over 200 initially.Resumed Coreg with dose now and use PRN clonidine as she does at home.   Hx of DVT and PE: holding Eliquis for now   COPD: Not in exacerbation continue bronchodilators as needed RA -stable on Leflunomide  Hypothyroidism: cont synthroid

## 2022-11-27 NOTE — Progress Notes (Signed)
Pt refused ensure and boost and RN did not order for this reason. Pt is NPO after midnight. Pt is drinking Medication for bowel prep at this time.

## 2022-11-27 NOTE — ED Notes (Signed)
ED TO INPATIENT HANDOFF REPORT  ED Nurse Name and Phone #: Renard Hamper Name/Age/Gender [name] 82 y.o. female Room/Bed: 021C/021C  Code Status   Code Status: DNR  Home/SNF/Other Home Patient oriented to: self, place, time, and situation Is this baseline? Yes   Triage Complete: Triage complete  Chief Complaint Acute GI bleeding [K92.2]  Triage Note From home. C/O rectal bleeding that only happens early Am and last PM. During the day she is fine.   Allergies Allergies  Allergen Reactions   Nsaids Other (See Comments)    Stomach upset Told to avoid due to Eliquis   Aspirin Other (See Comments)    Stomach upset Told to avoid due to Eliquis   Codeine Nausea And Vomiting   Nitrostat [Nitroglycerin] Other (See Comments)    Bradycardia. Drop in heart rate     Level of Care/Admitting Diagnosis ED Disposition     ED Disposition  Admit   Condition  --   Comment  Hospital Area: [facility] [[id number]]  Level of Care: Telemetry Medical [104]  May admit patient to [facility] or [facility] if equivalent level of care is available:: Yes  Covid Evaluation: Asymptomatic - no recent exposure (last 10 days) testing not required  Diagnosis: Acute GI bleeding [098119]  Admitting Physician: Briscoe Deutscher [[id number]]  Attending Physician: Briscoe Deutscher [[id number]]  Certification:: I certify this patient will need inpatient services for at least 2 midnights  Estimated Length of Stay: 3          B Medical/Surgery History Past Medical History:  Diagnosis Date   Abdominal aortic aneurysm (HCC)    REPAIRED IN 1996 BY [name]  AND HAS RECENTLY BEEN FOLLOWED BY [name]   Acquired asplenia     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 Most likely scar. Mild subendocardial ischemia. EF 71% LOW RISK.    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 Kellie Simmering   Pulmonary emboli [facility]) 02/06/2022   01/2022 PE/DVT post-op. Lower GI bleed     Treat edema  Start Eliquis at the reduced dose due to Creat>1.5 and age >80   Rheumatoid arthritis (HCC) 2011   Dr.Truslow; fracture knees, hands and wrists -    S/P CABG x 1 1996   CABG--LIMA-LAD for ostial LAD (not felt to be PCI amenable). EF NORMAL then; LIMA now atretic   Shortness of breath  dyspnea    with exertion   Stroke (HCC) 11/2014   TIA    Urinary frequency    Past Surgical History:  Procedure Laterality Date   ABDOMINAL AORTIC ANEURYSM REPAIR  1996   Complicated by mesenteric artery stenosis and splenic artery infarction with acquired Asplenia   APPENDECTOMY     BIOPSY  07/08/2022   Procedure: BIOPSY;  Surgeon: Benancio Deeds, MD;  Location: [facility] ENDOSCOPY;  Service: Gastroenterology;;   [name]  07/2011   right foot   CARDIAC CATHETERIZATION  2005   (Most recent CATH) - ostial LAD lesion 20-30% (down from 90% initially). Atretic LIMA. Minimal disease the RCA and Circumflex system.   CARPAL TUNNEL RELEASE Left    CATARACT EXTRACTION Bilateral    CERVICAL SPINE SURGERY     plate 1610 Dr.  Wynetta Emery   COLONOSCOPY     CORONARY ARTERY BYPASS GRAFT  1996   INCLUDED AN INTERNAL MAMMARY ARTERY TO THE LAD. EF WAS NORMAL   ESOPHAGOGASTRODUODENOSCOPY (EGD) WITH PROPOFOL N/A 07/08/2022   Procedure: ESOPHAGOGASTRODUODENOSCOPY (EGD) WITH PROPOFOL;  Surgeon: Benancio Deeds, MD;  Location: [facility] ENDOSCOPY;  Service: Gastroenterology;  Laterality: N/A;   INGUINAL HERNIA REPAIR Right    LAPAROSCOPIC APPENDECTOMY N/A 06/28/2016   Procedure: APPENDECTOMY LAPAROSCOPIC;  Surgeon: [name], MD;  Location: [facility] ORS;  Service: General;  Laterality: N/A;   NM MYOVIEW LTD  06/2010   Fixed anteroseptal, apical and inferoapical defect with moderate size. Most likely scar. Mild subendocardial ischemia. EF 71% LOW RISK.    SPLENECTOMY     TOTAL HIP ARTHROPLASTY Left 01/23/2022   Procedure: LEFT TOTAL HIP ARTHROPLASTY ANTERIOR APPROACH;  Surgeon: [name], MD;  Location: [facility] OR;  Service: Orthopedics;  Laterality: Left;  3-C   TRANSTHORACIC ECHOCARDIOGRAM  12/2014   [facility]: Normal LV size & function. EF 55-60%,    vagina polyp     VIDEO ASSISTED THORACOSCOPY (VATS)/WEDGE RESECTION Left 03/02/2015   Procedure: VIDEO ASSISTED THORACOSCOPY (VATS), MINI THORACOTOMY, LEFT UPPER LOBE WEDGE, TAKE DOWN OF INTERNAL MAMMARY LESIONS, PLACEMENT OF ON-Q PUMP;  Surgeon: [name], MD;  Location: [facility] OR;  Service: Thoracic;  Laterality: Left;   VIDEO BRONCHOSCOPY N/A 03/02/2015   Procedure: BRONCHOSCOPY;  Surgeon: [name], MD;  Location: [facility] OR;  Service: Thoracic;  Laterality: N/A;   VIDEO BRONCHOSCOPY WITH ENDOBRONCHIAL NAVIGATION N/A 10/08/2017   Procedure: VIDEO BRONCHOSCOPY WITH ENDOBRONCHIAL NAVIGATION WITH BIOPSIES OF LEFT UPPER LOBE AND LEFT LOWER LOBE;  Surgeon: [name], MD;  Location: [facility] OR;  Service: Thoracic;  Laterality: N/A;     A IV Location/Drains/Wounds Patient Lines/Drains/Airways Status     Active Line/Drains/Airways     Name Placement date Placement  time Site Days   Peripheral IV 11/27/22 20 G Left Antecubital 11/27/22  0329  Antecubital  less than 1   Wound / Incision (Open or Dehisced) 02/17/22 Irritant Dermatitis (Moisture Associated Skin Damage) Perineum Bilateral 02/17/22  0130  Perineum  283            Intake/Output Last 24 hours No intake or output data in the 24 hours ending 11/27/22 0449  Labs/Imaging Results for orders placed or performed during the hospital encounter of 11/26/22 (from the past 48 hour(s))  Basic metabolic panel     Status: Abnormal   Collection Time: 11/27/22  1:05 AM  Result Value Ref Range   Sodium 138 135 - 145 mmol/L   Potassium 4.3 3.5 - 5.1 mmol/L  Chloride 102 98 - 111 mmol/L   CO2 24 22 - 32 mmol/L   Glucose, Bld 110 (H) 70 - 99 mg/dL    Comment: Glucose reference range applies only to samples taken after fasting for at least 8 hours.   BUN 80 (H) 8 - 23 mg/dL   Creatinine, Ser 1.19 (H) 0.44 - 1.00 mg/dL   Calcium 9.6 8.9 - 14.7 mg/dL   GFR, Estimated 12 (L) >60 mL/min    Comment: (NOTE) Calculated using the CKD-EPI Creatinine Equation (2021)    Anion gap 12 5 - 15    Comment: Performed at [facility], [address], [place], [place] [address]  CBC with Differential     Status: Abnormal   Collection Time: 11/27/22  1:05 AM  Result Value Ref Range   WBC 8.5 4.0 - 10.5 K/uL   RBC 3.16 (L) 3.87 - 5.11 MIL/uL   Hemoglobin 10.0 (L) 12.0 - 15.0 g/dL   HCT 21.3 (L) 08.6 - 57.8 %   MCV 101.3 (H) 80.0 - 100.0 fL   MCH 31.6 26.0 - 34.0 pg   MCHC 31.3 30.0 - 36.0 g/dL   RDW 46.9 (H) 62.9 - 52.8 %   Platelets 227 150 - 400 K/uL   nRBC 0.0 0.0 - 0.2 %   Neutrophils Relative % 52 %   Neutro Abs 4.4 1.7 - 7.7 K/uL   Lymphocytes Relative 24 %   Lymphs Abs 2.0 0.7 - 4.0 K/uL   Monocytes Relative 17 %   Monocytes Absolute 1.4 (H) 0.1 - 1.0 K/uL   Eosinophils Relative 5 %   Eosinophils Absolute 0.4 0.0 - 0.5 K/uL   Basophils Relative 2 %   Basophils Absolute 0.2 (H) 0.0 - 0.1 K/uL    Immature Granulocytes 0 %   Abs Immature Granulocytes 0.03 0.00 - 0.07 K/uL    Comment: Performed at [facility], [address], [place], [place] [address]  Type and screen [facility]     Status: None   Collection Time: 11/27/22  1:05 AM  Result Value Ref Range   ABO/RH(D) O POS    Antibody Screen NEG    Sample Expiration      11/30/2022,2359 Performed at [facility], [address], [place], [place] [address]    No results found.  Pending Labs Unresulted Labs (From admission, onward)     Start     Ordered   11/27/22 0500  Basic metabolic panel  Daily,   R      11/27/22 0258   11/27/22 0500  Hemoglobin  Now then every 6 hours,   R (with TIMED occurrences)      11/27/22 0258   11/27/22 0500  Hematocrit  Now then every 6 hours,   R (with TIMED occurrences)      11/27/22 0258            Vitals/Pain Today's Vitals   11/27/22 0004 11/27/22 0052 11/27/22 0053 11/27/22 0055  BP:  (!) 186/70 (!) 191/61 (!) 171/72  Pulse:  68 69 79  Resp:  18 18 18   Temp:      TempSrc:      SpO2:  99% 95% 99%  Weight: 61 kg     Height: 5\' 3"  (1.6 m)     PainSc:        Isolation Precautions No active isolations  Medications Medications  leflunomide (ARAVA) tablet 10 mg (has no administration in time range)  atorvastatin (LIPITOR) tablet 80 mg (has no administration in time range)  sertraline (ZOLOFT) tablet 25 mg (has no administration in time range)  levothyroxine (SYNTHROID) tablet 88 mcg (has no administration in time range)  albuterol (PROVENTIL) (2.5 MG/3ML) 0.083% nebulizer solution 2.5 mg (has no administration in time range)  sodium chloride flush (NS) 0.9 % injection 3 mL (3 mLs Intravenous Given 11/27/22 0330)  acetaminophen (TYLENOL) tablet 650 mg (has no administration in time range)    Or  acetaminophen (TYLENOL) suppository 650 mg (has no administration in time range)  ondansetron (ZOFRAN) tablet 4 mg (has no administration in time  range)    Or  ondansetron (ZOFRAN) injection 4 mg (has no administration in time range)  0.9 %  sodium chloride infusion ( Intravenous New Bag/Given 11/27/22 0331)  pantoprazole (PROTONIX) EC tablet 40 mg (has no administration in time range)    Mobility walks     Focused Assessments Renal Assessment Handoff:  Hemodialysis Schedule:  Last Hemodialysis date and time:    Restricted appendage:     R Recommendations: See Admitting Provider Note  Report given to:   Additional Notes: Rectal bleeding. Active when she goes to the bathroom. Bright red in color

## 2022-11-27 NOTE — H&P (Addendum)
History and Physical    [name]:[id number] DOB: April 26, 1941 DOA: 11/26/2022  PCP: [name], MD   Patient coming from: Home   Chief Complaint: Rectal bleeding   HPI: [name] is a pleasant 82 y.o. female with medical history significant for rheumatoid arthritis, hypertension, hypothyroidism, CAD status post CABG, CKD stage IV, PUD, and diverticulosis who presents to the ED with rectal bleeding.  Patient reports 3 days of bright red and maroon blood per rectum.  There has not been any abdominal pain, nausea, or vomiting associated with this.  She has had multiple episodes where she feels the need to move her bowels but then passes mostly red and maroon blood.  She initially thought this was due to hemorrhoids but was having increased volume of bleeding last night which prompted her presentation.  She denies any lightheadedness, chest pain, or shortness of breath.  She does not use NSAIDs.  ED Course: Upon arrival to the ED, patient is found to be afebrile and saturating well on room air with normal heart rate and stable blood pressure.  Labs are most notable for BUN 80, creatinine 3.67, and hemoglobin 10.0.  Type and screen was performed in the ED and secure chat was sent to Dr. Elnoria Howard of GI with request for routine consultation.  Review of Systems:  All other systems reviewed and apart from HPI, are negative.  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 Most likely scar. Mild subendocardial ischemia. EF 71% LOW RISK.    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 Kellie Simmering   Pulmonary emboli [facility]) 02/06/2022   01/2022 PE/DVT post-op. Lower GI bleed     Treat edema  Start Eliquis at the reduced dose due to Creat>1.5 and age >42   Rheumatoid arthritis (HCC) 2011   Dr.Truslow; fracture knees, hands and wrists -    S/P CABG x 1 1996   CABG--LIMA-LAD for ostial LAD (not felt to be PCI amenable). EF NORMAL then; LIMA now atretic   Shortness of breath dyspnea    with exertion   Stroke (HCC) 11/2014   TIA    Urinary frequency     Past Surgical History:  Procedure Laterality Date  ABDOMINAL AORTIC ANEURYSM REPAIR  1996   Complicated by mesenteric artery stenosis and splenic artery infarction with acquired Asplenia   APPENDECTOMY     BIOPSY  07/08/2022   Procedure: BIOPSY;  Surgeon: Benancio Deeds, MD;  Location: [facility] ENDOSCOPY;  Service: Gastroenterology;;   [name]  07/2011   right foot   CARDIAC CATHETERIZATION  2005   (Most recent CATH) - ostial LAD lesion 20-30% (down from 90% initially). Atretic LIMA. Minimal disease the RCA and Circumflex system.   CARPAL TUNNEL RELEASE Left    CATARACT EXTRACTION Bilateral    CERVICAL SPINE SURGERY     plate [id number] Dr. Wynetta Emery   COLONOSCOPY     CORONARY ARTERY BYPASS GRAFT  1996   INCLUDED AN INTERNAL MAMMARY ARTERY TO THE LAD. EF WAS  NORMAL   ESOPHAGOGASTRODUODENOSCOPY (EGD) WITH PROPOFOL N/A 07/08/2022   Procedure: ESOPHAGOGASTRODUODENOSCOPY (EGD) WITH PROPOFOL;  Surgeon: Benancio Deeds, MD;  Location: [facility] ENDOSCOPY;  Service: Gastroenterology;  Laterality: N/A;   INGUINAL HERNIA REPAIR Right    LAPAROSCOPIC APPENDECTOMY N/A 06/28/2016   Procedure: APPENDECTOMY LAPAROSCOPIC;  Surgeon: [name], MD;  Location: [facility] ORS;  Service: General;  Laterality: N/A;   NM MYOVIEW LTD  06/2010   Fixed anteroseptal, apical and inferoapical defect with moderate size. Most likely scar. Mild subendocardial ischemia. EF 71% LOW RISK.    SPLENECTOMY     TOTAL HIP ARTHROPLASTY Left 01/23/2022   Procedure: LEFT TOTAL HIP ARTHROPLASTY ANTERIOR APPROACH;  Surgeon: [name], MD;  Location: [facility] OR;  Service: Orthopedics;  Laterality: Left;  3-C   TRANSTHORACIC ECHOCARDIOGRAM  12/2014   [facility]: Normal LV size & function. EF 55-60%,    vagina polyp     VIDEO ASSISTED THORACOSCOPY (VATS)/WEDGE RESECTION Left 03/02/2015   Procedure: VIDEO ASSISTED THORACOSCOPY (VATS), MINI THORACOTOMY, LEFT UPPER LOBE WEDGE, TAKE DOWN OF INTERNAL MAMMARY LESIONS, PLACEMENT OF ON-Q PUMP;  Surgeon: [name], MD;  Location: [facility] OR;  Service: Thoracic;  Laterality: Left;   VIDEO BRONCHOSCOPY N/A 03/02/2015   Procedure: BRONCHOSCOPY;  Surgeon: [name], MD;  Location: [facility] OR;  Service: Thoracic;  Laterality: N/A;   VIDEO BRONCHOSCOPY WITH ENDOBRONCHIAL NAVIGATION N/A 10/08/2017   Procedure: VIDEO BRONCHOSCOPY WITH ENDOBRONCHIAL NAVIGATION WITH BIOPSIES OF LEFT UPPER LOBE AND LEFT LOWER LOBE;  Surgeon: [name], MD;  Location: [facility] OR;  Service: Thoracic;  Laterality: N/A;    Social History:   reports that she quit smoking about 64 years ago. Her smoking use included cigarettes. She has never been exposed to tobacco smoke. She has never used smokeless tobacco. She reports current alcohol use. She reports that she does not use  drugs.  Allergies  Allergen Reactions   Nsaids Other (See Comments)    Stomach upset Told to avoid due to Eliquis   Aspirin Other (See Comments)    Stomach upset Told to avoid due to Eliquis   Codeine Nausea And Vomiting   Nitrostat [Nitroglycerin] Other (See Comments)    Bradycardia. Drop in heart rate     Family History  Problem Relation Age of Onset   Hypertension Mother    Diabetes Mother    Heart disease Mother    Hyperlipidemia Mother    Stroke Father    Hyperlipidemia Sister    Hypertension Sister    Hypertension Daughter    Cancer Paternal Uncle        Deceased from cancer not sure of site   Hypertension Son  Hyperlipidemia Son    Hyperlipidemia Son    Hypertension Son    Hyperlipidemia Son    Hypertension Son    Hypertension Other    Coronary artery disease Other    Asthma Neg Hx    Colon cancer Neg Hx      Prior to Admission medications   Medication Sig Start Date End Date Taking? Authorizing Provider  acetaminophen (TYLENOL) 325 MG tablet Take 2 tablets (650 mg total) by mouth 2 (two) times daily as needed (generalized pain). 08/24/22   Mast, Man X, NP  albuterol (VENTOLIN HFA) 108 (90 Base) MCG/ACT inhaler Inhale 1-2 puffs into the lungs every 4 (four) hours as needed for wheezing or shortness of breath. 03/27/22   Mast, Man X, NP  apixaban (ELIQUIS) 2.5 MG TABS tablet Take 1 tablet (2.5 mg total) by mouth 2 (two) times daily. 08/24/22   Mast, Man X, NP  ascorbic acid (VITAMIN C) 500 MG tablet Take 1 tablet (500 mg total) by mouth daily. 03/27/22   Mast, Man X, NP  atorvastatin (LIPITOR) 80 MG tablet Take 1 tablet (80 mg total) by mouth daily. 08/24/22   Mast, Man X, NP  carvedilol (COREG) 6.25 MG tablet Take 6.25 mg by mouth 2 (two) times daily with a meal.    [provider]  Cholecalciferol (VITAMIN D3) 50 MCG (2000 UT) capsule Take 1 capsule (2,000 Units total) by mouth daily. 03/27/22   Mast, Man X, NP  cloNIDine (CATAPRES - DOSED IN MG/24 HR)  0.1 mg/24hr patch Place 1 patch (0.1 mg total) onto the skin once a week. Patient taking differently: Place 0.2 mg onto the skin once a week. 10/19/22   Plotnikov, Georgina Quint, MD  cloNIDine (CATAPRES) 0.2 MG tablet Take 1 tablet (0.2 mg total) by mouth 4 (four) times daily as needed for up to 30 doses. For systolic blood pressure (top number) over 200 mmhg 10/19/22   Plotnikov, Georgina Quint, MD  clotrimazole-betamethasone (LOTRISONE) cream Apply 1 Application topically 2 (two) times daily. Mouth corner rash 09/05/22 09/05/23  Plotnikov, Georgina Quint, MD  Cyanocobalamin 2500 MCG TABS Take 2,500 mcg by mouth daily. 08/24/22   Mast, Man X, NP  docusate sodium (COLACE) 100 MG capsule Take 1-2 capsules (100-200 mg total) by mouth daily as needed for mild constipation. 08/24/22   Mast, Man X, NP  Fexofenadine HCl (ALLERGY 24-HR PO) Take 1 tablet by mouth in the morning and at bedtime. OTC - 12 hour    [provider]  Golimumab 50 MG/0.5ML SOAJ Take 50 mg by mouth every 2 (two) months. Hold for the next 2 weeks per Orthopedic surgery recommendations. 03/27/22   Mast, Man X, NP  leflunomide (ARAVA) 20 MG tablet Take 1 tablet (20 mg total) by mouth daily. Patient taking differently: Take 10 mg by mouth daily. 08/24/22   Mast, Man X, NP  levothyroxine (SYNTHROID) 88 MCG tablet Take 1 tablet (88 mcg total) by mouth daily before breakfast. 08/24/22   Mast, Man X, NP  losartan (COZAAR) 50 MG tablet Take 1 tablet (50 mg total) by mouth daily. Keep Aug appt for future refills 11/24/22   Plotnikov, Georgina Quint, MD  Multiple Vitamins-Minerals (CENTRUM ADULT PO) Take 1 tablet by mouth daily. 1 Tablet Daily.    [provider]  pantoprazole (PROTONIX) 40 MG tablet Take 1 tablet (40 mg total) by mouth daily. 08/24/22   Mast, Man X, NP  polyethylene glycol (MIRALAX / GLYCOLAX) 17 g packet Take 17 g by  mouth daily as needed. 08/24/22   Mast, Man X, NP  sertraline (ZOLOFT) 50 MG tablet Take 1 tablet (50 mg total) by mouth  daily. Patient taking differently: Take 25 mg by mouth daily. 10/19/22   Plotnikov, Georgina Quint, MD  triamcinolone (NASACORT) 55 MCG/ACT AERO nasal inhaler Place 1 spray into the nose daily. 08/24/22   Mast, Man X, NP    Physical Exam: Vitals:   11/27/22 0004 11/27/22 0052 11/27/22 0053 11/27/22 0055  BP:  (!) 186/70 (!) 191/61 (!) 171/72  Pulse:  68 69 79  Resp:  18 18 18   Temp:      TempSrc:      SpO2:  99% 95% 99%  Weight: 61 kg     Height: 5\' 3"  (1.6 m)       Constitutional: NAD, calm  Eyes: PERTLA, lids and conjunctivae normal ENMT: Mucous membranes are moist. Posterior pharynx clear of any exudate or lesions.   Neck: supple, no masses  Respiratory:  no wheezing, no crackles. No accessory muscle use.  Cardiovascular: S1 & S2 heard, regular rate and rhythm. No JVD. Abdomen: No distension, no tenderness, soft. Bowel sounds active.  Musculoskeletal: no clubbing / cyanosis. No joint deformity upper and lower extremities.   Skin: no significant rashes, lesions, ulcers. Warm, dry, well-perfused. Neurologic: CN 2-12 grossly intact. Moving all extremities. Alert and oriented.  Psychiatric: Pleasant. Cooperative.    Labs and Imaging on Admission: I have personally reviewed following labs and imaging studies  CBC: Recent Labs  Lab 11/27/22 0105  WBC 8.5  NEUTROABS 4.4  HGB 10.0*  HCT 32.0*  MCV 101.3*  PLT 227   Basic Metabolic Panel: Recent Labs  Lab 11/27/22 0105  NA 138  K 4.3  CL 102  CO2 24  GLUCOSE 110*  BUN 80*  CREATININE 3.67*  CALCIUM 9.6   GFR: Estimated Creatinine Clearance: 9.4 mL/min (A) (by C-G formula based on SCr of 3.67 mg/dL (H)). Liver Function Tests: No results for input(s): "AST", "ALT", "ALKPHOS", "BILITOT", "PROT", "ALBUMIN" in the last 168 hours. No results for input(s): "LIPASE", "AMYLASE" in the last 168 hours. No results for input(s): "AMMONIA" in the last 168 hours. Coagulation Profile: No results for input(s): "INR", "PROTIME" in  the last 168 hours. Cardiac Enzymes: No results for input(s): "CKTOTAL", "CKMB", "CKMBINDEX", "TROPONINI" in the last 168 hours. BNP (last 3 results) No results for input(s): "PROBNP" in the last 8760 hours. HbA1C: No results for input(s): "HGBA1C" in the last 72 hours. CBG: No results for input(s): "GLUCAP" in the last 168 hours. Lipid Profile: No results for input(s): "CHOL", "HDL", "LDLCALC", "TRIG", "CHOLHDL", "LDLDIRECT" in the last 72 hours. Thyroid Function Tests: No results for input(s): "TSH", "T4TOTAL", "FREET4", "T3FREE", "THYROIDAB" in the last 72 hours. Anemia Panel: No results for input(s): "VITAMINB12", "FOLATE", "FERRITIN", "TIBC", "IRON", "RETICCTPCT" in the last 72 hours. Urine analysis:    Component Value Date/Time   COLORURINE YELLOW 09/05/2022 1155   APPEARANCEUR CLEAR 09/05/2022 1155   LABSPEC <=1.005 (A) 09/05/2022 1155   PHURINE 6.0 09/05/2022 1155   GLUCOSEU NEGATIVE 09/05/2022 1155   HGBUR SMALL (A) 09/05/2022 1155   BILIRUBINUR NEGATIVE 09/05/2022 1155   KETONESUR NEGATIVE 09/05/2022 1155   PROTEINUR 100 (A) 07/22/2022 0805   UROBILINOGEN 0.2 09/05/2022 1155   NITRITE NEGATIVE 09/05/2022 1155   LEUKOCYTESUR NEGATIVE 09/05/2022 1155   Sepsis Labs: @LABRCNTIP (procalcitonin:4,lacticidven:4) )No results found for this or any previous visit (from the past 240 hour(s)).   Radiological Exams on Admission: No  results found.   Assessment/Plan   1. GI bleeding - Presents with 3 days of painless hematochezia   - She is hemodynamically stable; initial Hgb 10.0 (10.7 on July 10th)  - She has PUD and BUN is significantly increased but creatinine is also up, there is no upper GI sxs, no melena, and this is more likely diverticular  - Hold Eliquis, type & screen, follow serial H&H, transfuse as needed, follow-up on GI recommendations   2. AKI superimposed on CKD IV  - BUN is 80 and SCr 3.67 in ED, up from 49 and 3.11 in June 2024  - Hold losartan, hydrate  with IVF while NPO, transfuse RBCs as needed, renally-dose medications, repeat chem panel in am    3. CAD  - No angina - Continue Lipitor   4. Hypertension  - Hold scheduled antihypertensives initially given ongoing GI bleed   ADDENDUM: SBP sustaining over 200 but DBP normal. She is asymptomatic. Plan to resume Coreg with dose now and use as-needed clonidine as she does at home.    5. Hx of DVT and PE  - Hold Eliquis for now    6. COPD  - Not in exacerbation  - Continue bronchodilator  7. RA  - Leflunomide   8. Hypothyroidism  - Synthroid    DVT prophylaxis: SCDs Code Status: DNR  Level of Care: Level of care: Telemetry Medical Family Communication: None present   Disposition Plan:  Patient is from: Home  Anticipated d/c is to: Home  Anticipated d/c date is: 11/29/22 Patient currently: Pending GI consultation, stable H&H Consults called: GI  Admission status: Inpatient     Briscoe Deutscher, MD [facility]  11/27/2022, 2:58 AM

## 2022-11-27 NOTE — Consult Note (Signed)
Consultation  Referring Provider:   [facility] Primary Care Physician:  [name], MD Primary Gastroenterologist:  Dr. Adela Lank       Reason for Consultation:     hematochezia DOA: 11/26/2022         Hospital Day: 2         HPI:   [name] is a 82 y.o. female with past medical history significant for AAA repair 96, CAD status post CABG in 1996, celiac artery stenosis, CKD IV, COPD, history of TIA/CVA , Hx of PE on Eliquis, RA, diverticulosis, diverticular hemorrhage, adenomatous colon polyps. March had urosepsis ecoli and AKI on CKD.  She is on supplemental oxygen at night and CPAP, she had an echo in October showing grade 2 diastolic dysfunction with normal EF.  She is on eliquis, last dose yesterday AM.  Presents to the ER with painless hematochezia..  Work up notable for hemoglobin stable, BUN 80, creatinine 3.67, previously 3.3 creatinine and BUN of 60  hemoglobin 10, baseline appears to be between 10 and 11, hemoglobin this morning 10.  2017 colonoscopy with Dr. Adela Lank for loose stools/fecal incontience showed multiple polyps, medium mouthed diverticula left colon, internal hemorrhoids.  Path showed acute to thoroughly completely sessile serrated polyp invading her appendiceal orifice and had surgical resection of that.  She has not wanted surveillance colonoscopy since that time.  07/08/2022 EGD for melena - Esophagogastric landmarks identified. - 4 cm hiatal hernia. - LA Grade A esophagitis. - Normal esophagus otherwise. - 10 gastric ulcers as noted above - none with stigmata for bleeding - Mucosal changes in the stomach. Biopsied. - Non- bleeding duodenal ulcer with pigmented material. Path negative H pylori  She states she has a Retacrit infusion scheduled by her nephrologist.  Patient has had diverticular bleed in the past, states this is very similar but feels worse when she previously had. Yesterday started with urge to have a bowel movement, had  bright red bloody/dark blood, painless.  States now anytime she goes to urinate she will have bright red stools, last bowel movement was actually yesterday but she had 2 episodes last night blood per rectum with urination.  Has not had anything this morning but does feel like she may need to go to the restroom at this time. No fever, chills. No nausea, vomiting, AB pain.  No GERD She has lost about 30 lbs from April after she was hospitalized with urosepsis, states she has no appetite.  She is still on protonix, due to age and comorbidities patient did not want any further screening EGDs after last hospitalization. No NSAIDS. No ETOH, no smoking history, no drug use.   Abnormal ED labs: Abnormal Labs Reviewed  BASIC METABOLIC PANEL - Abnormal; Notable for the following components:      Result Value   Glucose, Bld 110 (*)    BUN 80 (*)    Creatinine, Ser 3.67 (*)    GFR, Estimated 12 (*)    All other components within normal limits  CBC WITH DIFFERENTIAL/PLATELET - Abnormal; Notable for the following components:   RBC 3.16 (*)    Hemoglobin 10.0 (*)    HCT 32.0 (*)    MCV 101.3 (*)    RDW 16.4 (*)    Monocytes Absolute 1.4 (*)    Basophils Absolute 0.2 (*)    All other components within normal limits  BASIC METABOLIC PANEL - Abnormal; Notable for the following components:   Glucose, Bld 100 (*)  BUN 81 (*)    Creatinine, Ser 3.62 (*)    GFR, Estimated 12 (*)    All other components within normal limits  HEMOGLOBIN - Abnormal; Notable for the following components:   Hemoglobin 9.8 (*)    All other components within normal limits  HEMATOCRIT - Abnormal; Notable for the following components:   HCT 31.3 (*)    All other components within normal limits    Past Medical History:  Diagnosis Date   Abdominal aortic aneurysm (HCC)    REPAIRED IN 1996 BY [name]  AND HAS RECENTLY BEEN FOLLOWED BY [name]   Acquired asplenia     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 Most likely scar. Mild subendocardial ischemia. EF 71% LOW RISK.    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 Kellie Simmering   Pulmonary emboli [facility]) 02/06/2022   01/2022 PE/DVT post-op. Lower GI bleed     Treat edema  Start Eliquis at the reduced dose due to Creat>1.5 and age >67   Rheumatoid arthritis (HCC) 2011   Dr.Truslow; fracture knees, hands and wrists -    S/P CABG x 1 1996   CABG--LIMA-LAD for ostial LAD (not felt to be PCI amenable). EF NORMAL then; LIMA now atretic   Shortness of breath dyspnea    with exertion   Stroke (HCC) 11/2014   TIA    Urinary frequency     Surgical History:  She  has a past surgical history that includes Splenectomy; Cardiac catheterization (2005); Cervical spine surgery; Carpal tunnel release (Left); Cataract extraction (Bilateral); Bunionectomy (07/2011); Coronary artery bypass graft (1996); Abdominal aortic aneurysm repair (1996); NM MYOVIEW LTD (06/2010); vagina polyp; Video bronchoscopy (N/A, 03/02/2015); Video assisted thoracoscopy (vats)/wedge resection (Left,  03/02/2015); transthoracic echocardiogram (12/2014); Inguinal hernia repair (Right); laparoscopic appendectomy (N/A, 06/28/2016); Appendectomy; Video bronchoscopy with endobronchial navigation (N/A, 10/08/2017); Colonoscopy; Total hip arthroplasty (Left, 01/23/2022); Esophagogastroduodenoscopy (egd) with propofol (N/A, 07/08/2022); and biopsy (07/08/2022). Family History:  Her family history includes Cancer in her paternal uncle; Coronary artery disease in an other family member; Diabetes in her mother; Heart disease in her mother; Hyperlipidemia in her mother, sister, son, son, and son; Hypertension in her daughter, mother, sister, son, son, son, and another family member; Stroke in her father. Social History:   reports that she quit smoking about 64 years ago. Her smoking use included cigarettes. She has never been exposed to tobacco smoke. She has  never used smokeless tobacco. She reports current alcohol use. She reports that she does not use drugs.  Prior to Admission medications   Medication Sig Start Date End Date Taking? Authorizing Provider  acetaminophen (TYLENOL) 325 MG tablet Take 2 tablets (650 mg total) by mouth 2 (two) times daily as needed (generalized pain). 08/24/22   Mast, Man X, NP  albuterol (VENTOLIN HFA) 108 (90 Base) MCG/ACT inhaler Inhale 1-2 puffs into the lungs every 4 (four) hours as needed for wheezing or shortness of breath. 03/27/22   Mast, Man X, NP  apixaban (ELIQUIS) 2.5 MG TABS tablet Take 1 tablet (2.5 mg total) by mouth 2 (two) times daily. 08/24/22   Mast, Man X, NP  ascorbic acid (VITAMIN C) 500 MG tablet Take 1 tablet (500 mg total) by mouth daily. 03/27/22   Mast, Man X, NP  atorvastatin (LIPITOR) 80 MG tablet Take 1 tablet (80 mg total) by mouth daily. 08/24/22   Mast, Man X, NP  carvedilol (COREG) 6.25 MG tablet Take 6.25 mg by mouth 2 (two) times daily with a meal.    [provider]  Cholecalciferol (VITAMIN D3) 50 MCG (2000 UT) capsule Take 1 capsule (2,000  Units total) by mouth daily. 03/27/22   Mast, Man X, NP  cloNIDine (CATAPRES - DOSED IN MG/24 HR) 0.1 mg/24hr patch Place 1 patch (0.1 mg total) onto the skin once a week. Patient taking differently: Place 0.2 mg onto the skin once a week. 10/19/22   Plotnikov, Georgina Quint, MD  cloNIDine (CATAPRES) 0.2 MG tablet Take 1 tablet (0.2 mg total) by mouth 4 (four) times daily as needed for up to 30 doses. For systolic blood pressure (top number) over 200 mmhg 10/19/22   Plotnikov, Georgina Quint, MD  clotrimazole-betamethasone (LOTRISONE) cream Apply 1 Application topically 2 (two) times daily. Mouth corner rash 09/05/22 09/05/23  Plotnikov, Georgina Quint, MD  Cyanocobalamin 2500 MCG TABS Take 2,500 mcg by mouth daily. 08/24/22   Mast, Man X, NP  docusate sodium (COLACE) 100 MG capsule Take 1-2 capsules (100-200 mg total) by mouth daily as needed for mild constipation. 08/24/22   Mast, Man X, NP  Fexofenadine HCl (ALLERGY 24-HR PO) Take 1 tablet by mouth in the morning and at bedtime. OTC - 12 hour    [provider]  Golimumab 50 MG/0.5ML SOAJ Take 50 mg by mouth every 2 (two) months. Hold for the next 2 weeks per Orthopedic surgery recommendations. 03/27/22   Mast, Man X, NP  leflunomide (ARAVA) 20 MG tablet Take 1 tablet (20 mg total) by mouth daily. Patient taking differently: Take 10 mg by mouth daily. 08/24/22   Mast, Man X, NP  levothyroxine (SYNTHROID) 88 MCG tablet Take 1 tablet (88 mcg total) by mouth daily before breakfast. 08/24/22   Mast, Man X, NP  losartan (COZAAR) 50 MG tablet Take 1 tablet (50 mg total) by mouth daily. Keep Aug appt for future refills 11/24/22   Plotnikov, Georgina Quint, MD  Multiple Vitamins-Minerals (CENTRUM ADULT PO) Take 1 tablet by mouth daily. 1 Tablet Daily.    [provider]  pantoprazole (PROTONIX) 40 MG tablet Take 1 tablet (40 mg total) by mouth daily. 08/24/22   Mast, Man X, NP  polyethylene glycol (MIRALAX / GLYCOLAX) 17 g packet Take 17 g by mouth daily as needed.  08/24/22   Mast, Man X, NP  sertraline (ZOLOFT) 50 MG tablet Take 1 tablet (50 mg total) by mouth daily. Patient taking differently: Take 25 mg by  mouth daily. 10/19/22   Plotnikov, Georgina Quint, MD  triamcinolone (NASACORT) 55 MCG/ACT AERO nasal inhaler Place 1 spray into the nose daily. 08/24/22   Mast, Man X, NP    Current Facility-Administered Medications  Medication Dose Route Frequency Provider Last Rate Last Admin   0.9 %  sodium chloride infusion   Intravenous Continuous Opyd, Lavone Neri, MD 100 mL/hr at 11/27/22 0331 New Bag at 11/27/22 0331   acetaminophen (TYLENOL) tablet 650 mg  650 mg Oral Q6H PRN Opyd, Lavone Neri, MD       Or   acetaminophen (TYLENOL) suppository 650 mg  650 mg Rectal Q6H PRN Opyd, Lavone Neri, MD       albuterol (PROVENTIL) (2.5 MG/3ML) 0.083% nebulizer solution 2.5 mg  2.5 mg Inhalation Q4H PRN Opyd, Lavone Neri, MD       atorvastatin (LIPITOR) tablet 80 mg  80 mg Oral Daily Opyd, Lavone Neri, MD       carvedilol (COREG) tablet 6.25 mg  6.25 mg Oral BID Opyd, Lavone Neri, MD   6.25 mg at 11/27/22 0602   cloNIDine (CATAPRES) tablet 0.2 mg  0.2 mg Oral Q4H PRN Opyd, Lavone Neri, MD   0.2 mg at 11/27/22 0650   leflunomide (ARAVA) tablet 10 mg  10 mg Oral Daily Opyd, Lavone Neri, MD       levothyroxine (SYNTHROID) tablet 88 mcg  88 mcg Oral QAC breakfast Opyd, Lavone Neri, MD       ondansetron (ZOFRAN) tablet 4 mg  4 mg Oral Q6H PRN Opyd, Lavone Neri, MD       Or   ondansetron (ZOFRAN) injection 4 mg  4 mg Intravenous Q6H PRN Opyd, Lavone Neri, MD       pantoprazole (PROTONIX) EC tablet 40 mg  40 mg Oral BID Opyd, Lavone Neri, MD       sertraline (ZOLOFT) tablet 25 mg  25 mg Oral Daily Opyd, Lavone Neri, MD       sodium chloride flush (NS) 0.9 % injection 3 mL  3 mL Intravenous Q12H Opyd, Lavone Neri, MD   3 mL at 11/27/22 0330    Allergies as of 11/26/2022 - Review Complete 11/11/2022  Allergen Reaction Noted   Nsaids Other (See Comments) 01/16/2022   Aspirin Other (See Comments)     Codeine Nausea And Vomiting    Nitrostat [nitroglycerin] Other (See Comments)     Review of Systems:    Constitutional: No weight loss, fever, chills, weakness or fatigue HEENT: Eyes: No change in vision               Ears, Nose, Throat:  No change in hearing or congestion Skin: No rash or itching Cardiovascular: No chest pain, chest pressure or palpitations   Respiratory: No SOB or cough Gastrointestinal: See HPI and otherwise negative Genitourinary: No dysuria or change in urinary frequency Neurological: No headache, dizziness or syncope Musculoskeletal: No new muscle or joint pain Hematologic: No bleeding or bruising Psychiatric: No history of depression or anxiety     Physical Exam:  Vital signs in last 24 hours: Temp:  [97.5 F (36.4 C)-97.8 F (36.6 C)] 97.5 F (36.4 C) (07/29 0500) Pulse Rate:  [67-81] 75 (07/29 0715) Resp:  [16-18] 16 (07/29 0715) BP: (163-252)/(59-88) 163/88 (07/29 0715) SpO2:  [91 %-99 %] 91 % (07/29 0715) Weight:  [61 kg] 61 kg (07/29 0004)   Last BM recorded by nurses in past 5 days No data recorded  General:   Pleasant, well developed  female in no acute distress Head:  Normocephalic and atraumatic. Eyes: sclerae anicteric,conjunctive pink  Heart:  regular rate and rhythm Pulm: Clear anteriorly; no wheezing Abdomen:  Soft, Obese AB, Active bowel sounds. No tenderness . Without guarding and Without rebound, No organomegaly appreciated. Extremities:  Without edema. Msk:  Symmetrical without gross deformities. Peripheral pulses intact.  Neurologic:  Alert and  oriented x4;  No focal deficits.  Skin:   Dry and intact without significant lesions or rashes. Psychiatric:  Cooperative. Normal mood and affect.  LAB RESULTS: Recent Labs    11/27/22 0105 11/27/22 0506  WBC 8.5  --   HGB 10.0* 9.8*  HCT 32.0* 31.3*  PLT 227  --    BMET Recent Labs    11/27/22 0105 11/27/22 0506  NA 138 137  K 4.3 4.4  CL 102 104  CO2 24 25  GLUCOSE 110*  100*  BUN 80* 81*  CREATININE 3.67* 3.62*  CALCIUM 9.6 9.5   LFT No results for input(s): "PROT", "ALBUMIN", "AST", "ALT", "ALKPHOS", "BILITOT", "BILIDIR", "IBILI" in the last 72 hours. PT/INR No results for input(s): "LABPROT", "INR" in the last 72 hours.  STUDIES: No results found.    Impression    Hematochezia without hemodynamic compromise In the setting of Eliquis last dose 07/20 8 in the morning WBC 8.5 HGB 10.0 MCV 101.3 Platelets 227 11/27/2022 BUN 81 Cr 3.62  11/08/2022 Iron 103 Ferritin 672 B12 >1500  Patient has had diverticular bleed in the past.  Patient has had recent EGD with several ulcers 10 to being stacked's negative H. pylori, treated with PPI twice daily which patient continues on.  Patient did have elevation of BUN from 60-80, however with HD stability, no significant H&H drop this points away from hematochezia secondary to upper GI bleed.  Is most likely represents diverticular bleed. Last colonoscopy 2017 Dr. Adela Lank for loose stools/fecal incontinence multiple polyps 1 being sessile serrated polyp invading appendiceal orifice surgical resection.  No further surveillance colonoscopies patient declined secondary to age and comorbid conditions  CKD stage IV GFR very low, likely 1 ovoid CTA  History of PE on Eliquis Last dose 7/20 8 in the morning  Chronic nocturnal hypoxia on oxygen and CPAP Just uses at night  Grade 2 diastolic dysfunction normal ejection fraction October 2023  CAD status post bypass 6, AAA status postrepair 96  Principal Problem:   Acute GI bleeding Active Problems:   Acquired hypothyroidism   Essential hypertension   Rheumatoid arthritis (HCC)   COPD (chronic obstructive pulmonary disease) (HCC)   CKD (chronic kidney disease) stage 4, GFR 15-29 ml/min (HCC) - baseline SCr 2.5. follows with Dr. Malen Gauze with [facility]   History of pulmonary embolism    LOS: 0 days     Plan   -Last colonoscopy was 2017, had several  polyps, one which was sessile serrated polyp invading appendiceal orifice status post surgical resection has not had repeat colonoscopy due to that time due to multiple comorbidities and age.  -Endoscopy 07/08/2022 with melena with 10 gastric ulcers negative H. pylori did not have repeat secondary again to age comorbid conditions Patient with painless hematochezia, has had diverticular bleeds in the past, with HD stability and stable hemoglobin I doubt this represents painless hematochezia from upper GI bleed. Will suggest proceeding with bleeding scan due to kidney function follow-up CTA with IR consultation if positive. -If patient has worsening hemoglobin, or continues to have hematochezia with negative bleeding scan, consider endoscopic evaluation with colonoscopy and  endoscopy, defer to Dr. [name] for further evaluation. --Continue to monitor H&H with transfusion as needed to maintain hemoglobin greater than 8 given cardiac history. -Continue to hold Eliquis at this time   Thank you for your kind consultation, we will continue to follow.   Doree Albee  11/27/2022, 9:57 AM

## 2022-11-27 NOTE — ED Provider Notes (Signed)
[place] EMERGENCY DEPARTMENT AT [facility] Provider Note   CSN: [id number] Arrival date & time: 11/26/22  2354     History  Chief Complaint  Patient presents with   Rectal Bleeding    [name] is a 82 y.o. female.  The history is provided by the patient.  Rectal Bleeding She has history of hypertension, hyperlipidemia, rheumatoid arthritis, COPD, chronic kidney disease, abdominal aortic aneurysm repair, mesenteric artery stenosis, pulmonary embolism anticoagulated on apixaban and comes in because of rectal bleeding.  For about the last 3 days, she has noted some bright red blood per rectum when she has a bowel movement in the morning and when she goes to bed at night.  There is no bleeding during the day.  Tonight, it seemed like it was more blood than usual.  She denies any pain or itching.  She thinks this is from hemorrhoids and has had similar problems in the past.   Home Medications Prior to Admission medications   Medication Sig Start Date End Date Taking? Authorizing Provider  acetaminophen (TYLENOL) 325 MG tablet Take 2 tablets (650 mg total) by mouth 2 (two) times daily as needed (generalized pain). 08/24/22   Mast, Man X, NP  albuterol (VENTOLIN HFA) 108 (90 Base) MCG/ACT inhaler Inhale 1-2 puffs into the lungs every 4 (four) hours as needed for wheezing or shortness of breath. 03/27/22   Mast, Man X, NP  apixaban (ELIQUIS) 2.5 MG TABS tablet Take 1 tablet (2.5 mg total) by mouth 2 (two) times daily. 08/24/22   Mast, Man X, NP  ascorbic acid (VITAMIN C) 500 MG tablet Take 1 tablet (500 mg total) by mouth daily. 03/27/22   Mast, Man X, NP  atorvastatin (LIPITOR) 80 MG tablet Take 1 tablet (80 mg total) by mouth daily. 08/24/22   Mast, Man X, NP  carvedilol (COREG) 6.25 MG tablet Take 6.25 mg by mouth 2 (two) times daily with a meal.    [provider]  Cholecalciferol (VITAMIN D3) 50 MCG (2000 UT) capsule Take 1 capsule (2,000 Units total) by mouth  daily. 03/27/22   Mast, Man X, NP  cloNIDine (CATAPRES - DOSED IN MG/24 HR) 0.1 mg/24hr patch Place 1 patch (0.1 mg total) onto the skin once a week. Patient taking differently: Place 0.2 mg onto the skin once a week. 10/19/22   Plotnikov, Georgina Quint, MD  cloNIDine (CATAPRES) 0.2 MG tablet Take 1 tablet (0.2 mg total) by mouth 4 (four) times daily as needed for up to 30 doses. For systolic blood pressure (top number) over 200 mmhg 10/19/22   Plotnikov, Georgina Quint, MD  clotrimazole-betamethasone (LOTRISONE) cream Apply 1 Application topically 2 (two) times daily. Mouth corner rash 09/05/22 09/05/23  Plotnikov, Georgina Quint, MD  Cyanocobalamin 2500 MCG TABS Take 2,500 mcg by mouth daily. 08/24/22   Mast, Man X, NP  docusate sodium (COLACE) 100 MG capsule Take 1-2 capsules (100-200 mg total) by mouth daily as needed for mild constipation. 08/24/22   Mast, Man X, NP  Fexofenadine HCl (ALLERGY 24-HR PO) Take 1 tablet by mouth in the morning and at bedtime. OTC - 12 hour    [provider]  Golimumab 50 MG/0.5ML SOAJ Take 50 mg by mouth every 2 (two) months. Hold for the next 2 weeks per Orthopedic surgery recommendations. 03/27/22   Mast, Man X, NP  leflunomide (ARAVA) 20 MG tablet Take 1 tablet (20 mg total) by mouth daily. Patient taking differently: Take 10 mg by mouth  daily. 08/24/22   Mast, Man X, NP  levothyroxine (SYNTHROID) 88 MCG tablet Take 1 tablet (88 mcg total) by mouth daily before breakfast. 08/24/22   Mast, Man X, NP  losartan (COZAAR) 50 MG tablet Take 1 tablet (50 mg total) by mouth daily. Keep Aug appt for future refills 11/24/22   Plotnikov, Georgina Quint, MD  Multiple Vitamins-Minerals (CENTRUM ADULT PO) Take 1 tablet by mouth daily. 1 Tablet Daily.    [provider]  pantoprazole (PROTONIX) 40 MG tablet Take 1 tablet (40 mg total) by mouth daily. 08/24/22   Mast, Man X, NP  polyethylene glycol (MIRALAX / GLYCOLAX) 17 g packet Take 17 g by mouth daily as needed. 08/24/22   Mast, Man X, NP   sertraline (ZOLOFT) 50 MG tablet Take 1 tablet (50 mg total) by mouth daily. Patient taking differently: Take 25 mg by mouth daily. 10/19/22   Plotnikov, Georgina Quint, MD  triamcinolone (NASACORT) 55 MCG/ACT AERO nasal inhaler Place 1 spray into the nose daily. 08/24/22   Mast, Man X, NP      Allergies    Nsaids, Aspirin, Codeine, and Nitrostat [nitroglycerin]    Review of Systems   Review of Systems  Gastrointestinal:  Positive for hematochezia.  All other systems reviewed and are negative.   Physical Exam Updated Vital Signs BP (!) 198/59 (BP Location: Right Arm)   Pulse 67   Temp 97.8 F (36.6 C) (Oral)   Resp 16   Ht 5\' 3"  (1.6 m)   Wt 61 kg   SpO2 91%   BMI 23.82 kg/m  Physical Exam Vitals and nursing note reviewed. Exam conducted with a chaperone present.   82 year old female, resting comfortably and in no acute distress. Vital signs are significant for elevated blood pressure. Oxygen saturation is 91%, which is normal. Head is normocephalic and atraumatic. PERRLA, EOMI. Oropharynx is clear. Neck is nontender and supple. Lungs are clear without rales, wheezes, or rhonchi. Chest is nontender. Heart has regular rate and rhythm without murmur. Abdomen is soft, flat, nontender. Rectal: External skin tag present but no obvious hemorrhoids.  Sphincter tone is decreased, no definite internal hemorrhoids palpated, no masses.  Small amount of brown stool noted, no gross blood. Extremities have no cyanosis or edema, full range of motion is present. Skin is warm and dry without rash. Neurologic: Mental status is normal, cranial nerves are intact, moves all extremities equally.  ED Results / Procedures / Treatments   Labs (all labs ordered are listed, but only abnormal results are displayed) Labs Reviewed  BASIC METABOLIC PANEL - Abnormal; Notable for the following components:      Result Value   Glucose, Bld 110 (*)    BUN 80 (*)    Creatinine, Ser 3.67 (*)    GFR, Estimated  12 (*)    All other components within normal limits  CBC WITH DIFFERENTIAL/PLATELET - Abnormal; Notable for the following components:   RBC 3.16 (*)    Hemoglobin 10.0 (*)    HCT 32.0 (*)    MCV 101.3 (*)    RDW 16.4 (*)    Monocytes Absolute 1.4 (*)    Basophils Absolute 0.2 (*)    All other components within normal limits  TYPE AND SCREEN    Procedures Procedures    Medications Ordered in ED Medications - No data to display  ED Course/ Medical Decision Making/ A&P  Medical Decision Making Amount and/or Complexity of Data Reviewed Labs: ordered.   Report of rectal bleeding with none evident on my exam.  I have ordered CBC, basic metabolic panel and I have also ordered orthostatic vital signs.  I have reviewed her past records, and last colonoscopy was on 03/29/2016 at which time she was noted to have some polyps and sigmoid diverticulosis.  I have reviewed and interpreted her laboratory test, and my interpretation is renal insufficiency which is somewhat worse compared with 10/04/2022 but in that range she has been that in the past, macrocytic anemia with hemoglobin having dropped 0.7 g compared with 11/08/2022.  Orthostatic vital signs did not show significant change in pulse or blood pressure.  Given slight change in hemoglobin and renal function, I have opting to hold the patient in the ED to recheck hemoglobin 4 hours after initial 1 to see if it is continuing to drop.  While waiting for repeat hemoglobin to be done, patient had a bowel movement where she passed a moderate to large amount of maroon stool.  I suspect that her known diverticulosis is the source of her bleeding.  With active bleeding and patient who is anticoagulated, I feel she will need to be admitted.  I have discussed the case with Dr. Antionette Char of [facility], who agrees to admit the patient.  I have also sent a secure chat to Dr. Elnoria Howard, on-call for gastroenterology, to advise him  that he will be on consult for this patient.  She is hemodynamically stable, it is safe to wait until morning for the consultation.  Final Clinical Impression(s) / ED Diagnoses Final diagnoses:  Lower GI bleed  Renal insufficiency  Chronic anticoagulation    Rx / DC Orders ED Discharge Orders     None         Dione Booze, MD 11/27/22 [phone]

## 2022-11-27 NOTE — TOC Initial Note (Signed)
Transition of Care (TOC) - Initial/Assessment Note   Spoke to patient's husband at bedside. Patient from home with husband, [facility]   Patient used walker prior to admission.   Confirmed face sheet information.   PCP DR Poltnikov   TOC will continue to follow for discharge needs Patient Details  Name: [name] MRN: [id number] Date of Birth: 1941-04-04  Transition of Care [facility]) CM/SW Contact:    Kingsley Plan, RN Phone Number: 11/27/2022, 1:04 PM  Clinical Narrative:                   Expected Discharge Plan: Home/Self Care Barriers to Discharge: Continued Medical Work up   Patient Goals and CMS Choice Patient states their goals for this hospitalization and ongoing recovery are:: to return to home          Expected Discharge Plan and Services   Discharge Planning Services: CM Consult                       DME Agency: NA       HH Arranged: NA          Prior Living Arrangements/Services   Lives with:: Spouse Patient language and need for interpreter reviewed:: Yes Do you feel safe going back to the place where you live?: Yes      Need for Family Participation in Patient Care: Yes (Comment) Care giver support system in place?: Yes (comment) Current home services: DME Criminal Activity/Legal Involvement Pertinent to Current Situation/Hospitalization: No - Comment as needed  Activities of Daily Living Home Assistive Devices/Equipment: CPAP ADL Screening (condition at time of admission) Patient's cognitive ability adequate to safely complete daily activities?: Yes Is the patient deaf or have difficulty hearing?: No Does the patient have difficulty seeing, even when wearing glasses/contacts?: No Does the patient have difficulty concentrating, remembering, or making decisions?: Yes Patient able to express need for assistance with ADLs?: Yes Does the patient have difficulty dressing or bathing?: No Independently performs  ADLs?: Yes (appropriate for developmental age) Does the patient have difficulty walking or climbing stairs?: No Weakness of Legs: None Weakness of Arms/Hands: None  Permission Sought/Granted   Permission granted to share information with : Yes, Verbal Permission Granted  Share Information with NAME: husand           Emotional Assessment Appearance:: Appears stated age       Alcohol / Substance Use: Not Applicable Psych Involvement: No (comment)  Admission diagnosis:  Acute GI bleeding [K92.2] Lower GI bleed [K92.2] Chronic anticoagulation [Z79.01] Renal insufficiency [N28.9] Patient Active Problem List   Diagnosis Date Noted   Acute GI bleeding 11/27/2022   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   Anxiety 10/02/2022   Excessive daytime sleepiness 09/26/2022   Severe obstructive sleep apnea 08/24/2022   Nasal bones, closed fracture 08/07/2022   Edema 07/26/2022   Acute gastric ulcer with hemorrhage 07/08/2022   Duodenal ulcer 07/08/2022   Dark stools 07/07/2022   Anemia 07/07/2022   E coli bacteremia 07/04/2022   Acute pyelonephritis 07/03/2022   Acute metabolic encephalopathy 07/03/2022   Hyponatremia 07/03/2022   Sepsis with acute renal failure (HCC) 07/03/2022   DNR (do not resuscitate)/DNI(Do Not Intubate) 07/03/2022   Generalized weakness 05/23/2022   Gallstones 03/17/2022   Acute renal failure superimposed on stage 4 chronic kidney disease (HCC) 03/06/2022   Metabolic acidosis, increased anion gap 03/06/2022   Hip dislocation, left (HCC) 03/06/2022   History of anemia  due to chronic kidney disease 03/06/2022   History of DVT (deep vein thrombosis) 03/06/2022   Allergic rhinitis 03/06/2022   History of pulmonary embolism 02/06/2022   Vitamin D deficiency 01/30/2022   Slow transit constipation 01/30/2022   GERD (gastroesophageal reflux disease) 01/30/2022   Status post total replacement of left hip 01/23/2022   Melena    Upper GI bleed 11/17/2021   CKD  (chronic kidney disease) stage 4, GFR 15-29 ml/min (HCC) - baseline SCr 2.5. follows with Dr. Malen Gauze with [facility] 11/17/2021   Primary osteoarthritis of left hip 10/06/2021   Left hip pain 12/09/2020   Aortic atherosclerosis (HCC) 09/07/2020   Gait disorder 06/08/2020   Neoplasm of uncertain behavior of skin 03/14/2020   Arthralgia 12/25/2019   Ankle swelling 08/08/2019   Osteoporosis 02/26/2018   Colon polyp 06/28/2016   COPD (chronic obstructive pulmonary disease) (HCC) 05/28/2016   Upper airway cough syndrome 05/22/2016   Acute respiratory failure (HCC) 04/15/2016   Wrist pain, right 12/14/2015   Lung mass 03/02/2015   Pre-operative cardiovascular examination 02/21/2015   DOE (dyspnea on exertion) 02/16/2015   TIA (transient ischemic attack) 12/31/2014   Abnormal mental state 12/30/2014   Well adult exam 11/26/2014   Seasonal allergies 12/31/2013   Splenic artery aneurysm (HCC) 04/07/2013   Cerumen impaction 03/20/2013   Hyperlipidemia    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   Anticoagulated 11/08/2010   Rheumatoid arthritis (HCC) 06/23/2010   Pain in joint 11/26/2009   Acquired hypothyroidism 12/08/2006   Macular degeneration (senile) of retina 12/08/2006   Essential hypertension 12/08/2006   Diaphragmatic hernia 12/08/2006   ASPLENIA 12/08/2006   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) 12/22/1994    Class: History of   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; now atretic and significantly improved LAD lesion without intervention 12/22/1994    Class: History of   PCP:  Plotnikov, Georgina Quint, MD Pharmacy:   [id number] [place], [place] - [address] [place] [address] Phone: [phone] Fax: [phone]  [facility] - [place], [place] - [address] [place] [address] Phone: [phone] Fax:  [phone]     Social Determinants of Health (SDOH) Social History: SDOH Screenings   Food Insecurity: Patient Declined (11/27/2022)  Housing: Patient Declined (11/27/2022)  Transportation Needs: Patient Declined (11/27/2022)  Utilities: Patient Declined (11/27/2022)  Alcohol Screen: Low Risk  (05/30/2021)  Depression (PHQ2-9): Medium Risk (10/30/2022)  Financial Resource Strain: Low Risk  (09/05/2022)  Physical Activity: Insufficiently Active (10/30/2022)  Social Connections: Moderately Integrated (09/05/2022)  Stress: Stress Concern Present (10/30/2022)  Tobacco Use: Medium Risk (11/27/2022)   SDOH Interventions:     Readmission Risk Interventions    07/05/2022    9:42 AM  Readmission Risk Prevention Plan  Transportation Screening Complete  Medication Review (RN Care Manager) Complete  PCP or Specialist appointment within 3-5 days of discharge Complete  HRI or Home Care Consult Complete  SW Recovery Care/Counseling Consult Complete  Palliative Care Screening Not Applicable  Skilled Nursing Facility Complete

## 2022-11-27 NOTE — Hospital Course (Addendum)
82 y.o.f w/ RA,hypertension,hypothyroidism,CAD s/p CABG,CKD stage IV, PUD, and diverticulosis who presented to the ED with rectal bleeding x3 days of bright red and maroon blood per rectum. There has not been any abdominal pain, nausea, or vomiting associated with this.  She has had multiple episodes where she feels the need to move her bowels but then passes mostly red and maroon blood.  She initially thought this was due to hemorrhoids but was having increased volume of bleeding last night which prompted her presentation. No NSAID use history. -last colonoscopy in 2017 showed SSA invading her appendiceal orifice, colon polyps, diverticula in the left colon and hemorrhoids and had colon resection for removal of SSA and appendiceal orifice EGD in March of this YEAR with a hiatal hernia and grade a esophagitis and 10 gastric ulcers and nonbleeding duodenal ulcer negative H. pylori, In the ED,  afebrile vital stable-BUN 80, creatinine 3.67, and hemoglobin 10.0. Type and screen was performed in the ED and secure chat was sent to Dr. Elnoria Howard of GI with request for routine consultation. Seen by GI- tagged RBC scan was negative for source of GI bleeding on 7/29 GI recommended EGD and colonoscopy for further evaluation. Underwent EGD and colonoscopy 7/30: EGD showed some mild gastritis, salmon colored mucosa in esophagus that was biopsied. Prior gastric ulcers have healed. Colonoscopy with 3 small polyps that were removed, diverticulosis, hemorrhoids. Suspect her bleeding is likely due to a diverticular bleed that has now resolved. Patient had yellow stool on colonoscopy. Okay to restart her Eliquis 7/31 because polyps were removed - gi advised OP f/u

## 2022-11-28 ENCOUNTER — Ambulatory Visit: Payer: Self-pay | Admitting: Licensed Clinical Social Worker

## 2022-11-28 ENCOUNTER — Encounter (HOSPITAL_COMMUNITY)
Admission: EM | Disposition: A | Discharge status: Home / Self Care | Payer: Self-pay | Source: Home / Self Care | Attending: Internal Medicine

## 2022-11-28 ENCOUNTER — Inpatient Hospital Stay (HOSPITAL_COMMUNITY): Payer: Medicare PPO | Admitting: Anesthesiology

## 2022-11-28 ENCOUNTER — Telehealth: Payer: Self-pay

## 2022-11-28 ENCOUNTER — Encounter (HOSPITAL_COMMUNITY): Payer: Self-pay | Admitting: Family Medicine

## 2022-11-28 DIAGNOSIS — K922 Gastrointestinal hemorrhage, unspecified: Secondary | ICD-10-CM | POA: Diagnosis not present

## 2022-11-28 DIAGNOSIS — K2970 Gastritis, unspecified, without bleeding: Secondary | ICD-10-CM | POA: Diagnosis not present

## 2022-11-28 DIAGNOSIS — K625 Hemorrhage of anus and rectum: Secondary | ICD-10-CM

## 2022-11-28 DIAGNOSIS — D122 Benign neoplasm of ascending colon: Secondary | ICD-10-CM

## 2022-11-28 DIAGNOSIS — K648 Other hemorrhoids: Secondary | ICD-10-CM | POA: Diagnosis not present

## 2022-11-28 DIAGNOSIS — I2510 Atherosclerotic heart disease of native coronary artery without angina pectoris: Secondary | ICD-10-CM

## 2022-11-28 DIAGNOSIS — D649 Anemia, unspecified: Secondary | ICD-10-CM

## 2022-11-28 DIAGNOSIS — D126 Benign neoplasm of colon, unspecified: Secondary | ICD-10-CM

## 2022-11-28 DIAGNOSIS — K449 Diaphragmatic hernia without obstruction or gangrene: Secondary | ICD-10-CM

## 2022-11-28 DIAGNOSIS — K5730 Diverticulosis of large intestine without perforation or abscess without bleeding: Secondary | ICD-10-CM

## 2022-11-28 DIAGNOSIS — D123 Benign neoplasm of transverse colon: Secondary | ICD-10-CM | POA: Diagnosis not present

## 2022-11-28 HISTORY — PX: ESOPHAGOGASTRODUODENOSCOPY (EGD) WITH PROPOFOL: SHX5813

## 2022-11-28 HISTORY — PX: POLYPECTOMY: SHX5525

## 2022-11-28 HISTORY — PX: COLONOSCOPY WITH PROPOFOL: SHX5780

## 2022-11-28 HISTORY — PX: BIOPSY: SHX5522

## 2022-11-28 SURGERY — ESOPHAGOGASTRODUODENOSCOPY (EGD) WITH PROPOFOL
Anesthesia: Monitor Anesthesia Care

## 2022-11-28 MED ORDER — HYDRALAZINE HCL 20 MG/ML IJ SOLN
INTRAMUSCULAR | Status: AC
  Filled 2022-11-28: qty 1

## 2022-11-28 MED ORDER — LACTATED RINGERS IV SOLN
INTRAVENOUS | Status: AC | PRN
  Administered 2022-11-28: 10 mL/h via INTRAVENOUS

## 2022-11-28 MED ORDER — PHENYLEPHRINE HCL (PRESSORS) 10 MG/ML IV SOLN
INTRAVENOUS | Status: DC | PRN
  Administered 2022-11-28 (×2): 80 ug via INTRAVENOUS

## 2022-11-28 MED ORDER — PROPOFOL 10 MG/ML IV BOLUS
INTRAVENOUS | Status: DC | PRN
  Administered 2022-11-28: 20 mg via INTRAVENOUS
  Administered 2022-11-28: 100 ug/kg/min via INTRAVENOUS
  Administered 2022-11-28: 20 mg via INTRAVENOUS
  Administered 2022-11-28: 120 ug/kg/min via INTRAVENOUS

## 2022-11-28 MED ORDER — LIDOCAINE 2% (20 MG/ML) 5 ML SYRINGE
INTRAMUSCULAR | Status: DC | PRN
  Administered 2022-11-28: 50 mg via INTRAVENOUS

## 2022-11-28 MED ORDER — HYDRALAZINE HCL 20 MG/ML IJ SOLN
5.0000 mg | Freq: Once | INTRAMUSCULAR | Status: AC
  Administered 2022-11-28: 5 mg via INTRAVENOUS

## 2022-11-28 MED ORDER — SODIUM CHLORIDE 0.9 % IV SOLN: INTRAVENOUS | Status: DC

## 2022-11-28 MED ORDER — FEXOFENADINE HCL 60 MG PO TABS
60.0000 mg | ORAL_TABLET | Freq: Two times a day (BID) | ORAL | Status: DC
  Administered 2022-11-28 – 2022-11-29 (×3): 60 mg via ORAL
  Filled 2022-11-28 (×3): qty 1

## 2022-11-28 SURGICAL SUPPLY — 25 items

## 2022-11-28 NOTE — Interval H&P Note (Signed)
History and Physical Interval Note:  11/28/2022 12:16 PM  [name]  has presented today for surgery, with the diagnosis of Hematochezia, anemia.  The various methods of treatment have been discussed with the patient and family. After consideration of risks, benefits and other options for treatment, the patient has consented to  Procedure(s): ESOPHAGOGASTRODUODENOSCOPY (EGD) WITH PROPOFOL (N/A) COLONOSCOPY WITH PROPOFOL (N/A) as a surgical intervention.  The patient's history has been reviewed, patient examined, no change in status, stable for surgery.  I have reviewed the patient's chart and labs.  Questions were answered to the patient's satisfaction.     [name]

## 2022-11-28 NOTE — Telephone Encounter (Signed)
-----   Message from [name] sent at 11/28/2022  2:13 PM EDT ----- [facility], please arrange for GI clinic follow up with Dr. Adela Lank or APP in 1-2 months for anemia. Thanks.

## 2022-11-28 NOTE — Patient Outreach (Signed)
  Care Coordination   11/28/2022 Name: [name] MRN: [id number] DOB: 09-20-1940   Care Coordination Outreach Attempts:  An unsuccessful telephone outreach was attempted today to offer the patient information about available care coordination services.  Follow Up Plan:  Additional outreach attempts will be made to offer the patient care coordination information and services.   Encounter Outcome:  No Answer   Care Coordination Interventions:  No, not indicated    Kelton Pillar.Avian Greenawalt MSW, LCSW Licensed Visual merchandiser [facility] Care Management [phone]

## 2022-11-28 NOTE — Op Note (Signed)
[facility] Patient Name: [name] Procedure Date : 11/28/2022 MRN: [id number] Attending MD: Particia Lather , , [id number] Date of Birth: 02-Dec-1940 CSN: [id number] Age: 82 Admit Type: Inpatient Procedure:                Colonoscopy Indications:              Rectal bleeding, Anemia Providers:                Madelyn Brunner" Jesus Genera, RN, Eliberto Ivory, RN, Melany Guernsey, Technician Referring MD:             Hospitalist team Medicines:                Monitored Anesthesia Care Complications:            No immediate complications. Estimated Blood Loss:     Estimated blood loss was minimal. Procedure:                Pre-Anesthesia Assessment:                           - Prior to the procedure, a History and Physical                            was performed, and patient medications and                            allergies were reviewed. The patient's tolerance of                            previous anesthesia was also reviewed. The risks                            and benefits of the procedure and the sedation                            options and risks were discussed with the patient.                            All questions were answered, and informed consent                            was obtained. Prior Anticoagulants: The patient has                            taken Eliquis (apixaban), last dose was 2 days                            prior to procedure. ASA Grade Assessment: III - A                            patient with severe systemic disease. After  reviewing the risks and benefits, the patient was                            deemed in satisfactory condition to undergo the                            procedure.                           After obtaining informed consent, the colonoscope                            was passed under direct vision. Throughout the                            procedure,  the patient's blood pressure, pulse, and                            oxygen saturations were monitored continuously. The                            [id number] ([id number] Olympus coloscope was                            introduced through the anus and advanced to the the                            cecum, identified by appendiceal orifice and                            ileocecal valve. The colonoscopy was performed                            without difficulty. The patient tolerated the                            procedure well. The quality of the bowel                            preparation was good. The ileocecal valve,                            appendiceal orifice, and rectum were photographed. Scope In: 1:00:36 PM Scope Out: 1:17:19 PM Scope Withdrawal Time: 0 hours 12 minutes 36 seconds  Total Procedure Duration: 0 hours 16 minutes 43 seconds  Findings:      The terminal ileum appeared normal.      Three sessile polyps were found in the transverse colon and ascending       colon. The polyps were 3 to 5 mm in size. These polyps were removed with       a cold snare. Resection and retrieval were complete.      Multiple diverticula were found in the sigmoid colon and descending       colon.      Non-bleeding internal hemorrhoids were found during retroflexion. Impression:               -  The examined portion of the ileum was normal.                           - Three 3 to 5 mm polyps in the transverse colon                            and in the ascending colon, removed with a cold                            snare. Resected and retrieved.                           - Diverticulosis in the sigmoid colon and in the                            descending colon.                           - Non-bleeding internal hemorrhoids. Recommendation:           - Discharge patient to home (with escort).                           - No active bleeding was visualized. It is                            suspected  that the patient had a diverticular bleed                            and/or hemorrhoidal bleed that has now resolved.                           - Await pathology results.                           - Okay to restart Eliquis tomorrow.                           - We will arrange for GI clinic follow up.                           - The findings and recommendations were discussed                            with the patient. Procedure Code(s):        --- Professional ---                           [phone], Colonoscopy, flexible; with removal of                            tumor(s), polyp(s), or other lesion(s) by snare                            technique Diagnosis Code(s):        ---  Professional ---                           K64.8, Other hemorrhoids                           D12.3, Benign neoplasm of transverse colon (hepatic                            flexure or splenic flexure)                           D12.2, Benign neoplasm of ascending colon                           K62.5, Hemorrhage of anus and rectum                           D64.9, Anemia, unspecified                           K57.30, Diverticulosis of large intestine without                            perforation or abscess without bleeding CPT copyright 2022 American [employer]. All rights reserved. The codes documented in this report are preliminary and upon coder review may  be revised to meet current compliance requirements. Dr Particia Lather [address]" [place],  11/28/2022 2:04:02 PM Number of Addenda: 0

## 2022-11-28 NOTE — Transfer of Care (Signed)
Immediate Anesthesia Transfer of Care Note  Patient: [name]  Procedure(s) Performed: ESOPHAGOGASTRODUODENOSCOPY (EGD) WITH PROPOFOL COLONOSCOPY WITH PROPOFOL BIOPSY POLYPECTOMY  Patient Location: Endoscopy Unit  Anesthesia Type:MAC  Level of Consciousness: drowsy  Airway & Oxygen Therapy: Patient Spontanous Breathing and Patient connected to nasal cannula oxygen  Post-op Assessment: Report given to RN and Post -op Vital signs reviewed and stable  Post vital signs: Reviewed and stable, see endoscopy unit post op vital signs flow sheet  Last Vitals:  Vitals Value Taken Time  BP    Temp    Pulse    Resp    SpO2      Last Pain:  Vitals:   11/28/22 1142  TempSrc: Temporal  PainSc: 0-No pain         Complications: No notable events documented.

## 2022-11-28 NOTE — Anesthesia Postprocedure Evaluation (Signed)
Anesthesia Post Note  Patient: [name]  Procedure(s) Performed: ESOPHAGOGASTRODUODENOSCOPY (EGD) WITH PROPOFOL COLONOSCOPY WITH PROPOFOL BIOPSY POLYPECTOMY     Patient location during evaluation: PACU Anesthesia Type: MAC Level of consciousness: awake Pain management: pain level controlled Vital Signs Assessment: post-procedure vital signs reviewed and stable Respiratory status: spontaneous breathing, nonlabored ventilation and respiratory function stable Cardiovascular status: stable and blood pressure returned to baseline Postop Assessment: no apparent nausea or vomiting Anesthetic complications: no   No notable events documented.  Last Vitals:  Vitals:   11/28/22 1600 11/28/22 2035  BP: (!) 176/72 (!) 151/52  Pulse: 76 79  Resp: 17   Temp: 36.5 C 36.8 C  SpO2:  95%    Last Pain:  Vitals:   11/28/22 1600  TempSrc: Oral  PainSc:                  Linton Rump

## 2022-11-28 NOTE — Progress Notes (Signed)
Tubed clonidine to nurse in pre op tube station 25

## 2022-11-28 NOTE — Progress Notes (Signed)
PROGRESS NOTE QUNITA AASE  ZOX:[id number] DOB: December 14, 1940 DOA: 11/26/2022 PCP: [name], MD  Brief Narrative/Hospital Course: 82 y.o.f w/ RA,hypertension,hypothyroidism,CAD s/p CABG,CKD stage IV, PUD, and diverticulosis who presented to the ED with rectal bleeding x3 days of bright red and maroon blood per rectum. There has not been any abdominal pain, nausea, or vomiting associated with this.  She has had multiple episodes where she feels the need to move her bowels but then passes mostly red and maroon blood.  She initially thought this was due to hemorrhoids but was having increased volume of bleeding last night which prompted her presentation. No NSAID use history. -last colonoscopy in 2017 showed SSA invading her appendiceal orifice, colon polyps, diverticula in the left colon and hemorrhoids and had colon resection for removal of SSA and appendiceal orifice EGD in March of this YEAR with a hiatal hernia and grade a esophagitis and 10 gastric ulcers and nonbleeding duodenal ulcer negative H. pylori,  In the ED,  afebrile vital stable-BUN 80, creatinine 3.67, and hemoglobin 10.0. Type and screen was performed in the ED and secure chat was sent to Dr. Elnoria Howard of GI with request for routine consultation. Seen by GI- tagged RBC scan was negative for source of GI bleeding on 7/29  GI recommended EGD and colonoscopy for further evaluation.     Subjective: Patient seen and examined this morning She was resting comfortably No nausea vomiting awaiting for EGD and colonoscopy this morning   Assessment and Plan: Principal Problem:   Acute GI bleeding Active Problems:   Acquired hypothyroidism   Essential hypertension   Rheumatoid arthritis (HCC)   COPD (chronic obstructive pulmonary disease) (HCC)   CKD (chronic kidney disease) stage 4, GFR 15-29 ml/min (HCC) - baseline SCr 2.5. follows with Dr. Malen Gauze with [facility]   History of pulmonary embolism   Chronic anticoagulation    Lower GI bleed  GI bleeding w/ painless hematochezia:   Seen by GI- tagged RBC scan was negative for source of GI bleeding on 7/29.  Previously she has had EGD colonoscopy see result above GI recommended EGD and colonoscopy for further evaluation 7/30.  Trend H&H currently remained stable.  Holding Eliquis. Recent Labs  Lab 11/27/22 0506 11/27/22 1042 11/27/22 1653 11/27/22 2251 11/28/22 0443  HGB 9.8* 10.0* 9.3* 10.2* 10.3*  HCT 31.3* 30.9* 29.6* 32.2* 32.6*    AKI superimposed on CKD IV: Creatinine about the same slightly worse than baseline continue to monitor.  Doing about nonproductive medication , CONT IVF Recent Labs    07/24/22 1009 07/25/22 0533 08/01/22 0800 08/02/22 0918 08/02/22 0927 09/05/22 1155 09/30/22 1849 10/04/22 1300 11/27/22 0105 11/27/22 0506 11/28/22 0443  BUN 65* 62* 57* 56* 56* 60* 61* 49* 80* 81* 68*  CREATININE 3.84* 3.71* 3.3* 3.35* 3.60* 3.46* 3.30* 3.11* 3.67* 3.62* 3.26*  CO2 21* 21* 25* 23  --  32 21* 22 24 25  21*    CAD:No angina.Continue Lipitor   Hypertension: Blood pressure poorly controlled, she is currently on clonidine 02 mg weekly placed on 7/28 and still has it, SBP remains high continue home prn clonidine, Coreg .  Monitor and adjust  Hx of DVT and PE: holding Eliquis for now    COPD: Not in exacerbation continue bronchodilators as needed  RA -stable on Leflunomide   Hypothyroidism: cont synthroid    DVT prophylaxis: SCDs Start: 11/27/22 0257 Code Status:   Code Status: DNR Family Communication: plan of care discussed with patient at bedside. Patient status  is:  inpatietn because of gi bleeding Level of care: Telemetry Medical   Dispo: The patient is from: home            Anticipated disposition: TBD Objective: Vitals last 24 hrs: Vitals:   11/27/22 2138 11/27/22 2320 11/28/22 0352 11/28/22 0520  BP: (!) 193/75 (!) 155/64 (!) 230/85 (!) 183/68  Pulse: 69 61 84 87  Resp: 19 18  20   Temp: (!) 97.5 F (36.4 C) 98.1  F (36.7 C) 97.6 F (36.4 C)   TempSrc: Oral  Oral   SpO2: 99% 95% 100% 92%  Weight:      Height:       Weight change:   Physical Examination: General exam: alert awake, older than stated age HEENT:Oral mucosa moist, Ear/Nose WNL grossly Respiratory system: bilaterally CLEAR BS, no use of accessory muscle Cardiovascular system: S1 & S2 +, No JVD. Gastrointestinal system: Abdomen soft,NT,ND, BS+ Nervous System:Alert, awake, moving extremities. Extremities: LE edema NEG,distal peripheral pulses palpable.  Skin: No rashes,no icterus. MSK: Normal muscle bulk,tone, power  Medications reviewed:  Scheduled Meds:  atorvastatin  80 mg Oral Daily   carvedilol  6.25 mg Oral BID   cyanocobalamin  2,500 mcg Oral Daily   fexofenadine  60 mg Oral BID   leflunomide  10 mg Oral Daily   levothyroxine  88 mcg Oral QAC breakfast   multivitamin   Oral Daily   pantoprazole  40 mg Oral BID   sertraline  25 mg Oral Daily   sodium chloride flush  3 mL Intravenous Q12H   Continuous Infusions:  sodium chloride      Diet Order             Diet NPO time specified Except for: Sips with Meds  Diet effective midnight           Diet NPO time specified  Diet effective midnight                   Intake/Output Summary (Last 24 hours) at 11/28/2022 0904 Last data filed at 11/27/2022 2207 Gross per 24 hour  Intake 720.24 ml  Output --  Net 720.24 ml   Net IO Since Admission: 720.24 mL [11/28/22 0904]  Wt Readings from Last 3 Encounters:  11/27/22 61 kg  11/10/22 59.3 kg  11/01/22 59.1 kg     Unresulted Labs (From admission, onward)     Start     Ordered   11/28/22 0500  CBC  Daily,   R      11/27/22 0941   11/27/22 0500  Basic metabolic panel  Daily,   R      11/27/22 0258          Data Reviewed: I have personally reviewed following labs and imaging studies CBC: Recent Labs  Lab 11/27/22 0105 11/27/22 0506 11/27/22 1042 11/27/22 1653 11/27/22 2251 11/28/22 0443  WBC 8.5   --   --   --   --  9.2  NEUTROABS 4.4  --   --   --   --   --   HGB 10.0* 9.8* 10.0* 9.3* 10.2* 10.3*  HCT 32.0* 31.3* 30.9* 29.6* 32.2* 32.6*  MCV 101.3*  --   --   --   --  99.4  PLT 227  --   --   --   --  256   Basic Metabolic Panel: Recent Labs  Lab 11/27/22 0105 11/27/22 0506 11/28/22 0443  NA 138 137 139  K 4.3  4.4 4.6  CL 102 104 105  CO2 24 25 21*  GLUCOSE 110* 100* 119*  BUN 80* 81* 68*  CREATININE 3.67* 3.62* 3.26*  CALCIUM 9.6 9.5 9.7   GFR: Estimated Creatinine Clearance: 10.6 mL/min (A) (by C-G formula based on SCr of 3.26 mg/dL (H)).  No results found for this or any previous visit (from the past 240 hour(s)).  Antimicrobials: Anti-infectives (From admission, onward)    None      Culture/Microbiology    Component Value Date/Time   SDES BLOOD LEFT ARM 07/03/2022 0220   SPECREQUEST  07/03/2022 0220    BOTTLES DRAWN AEROBIC AND ANAEROBIC Blood Culture adequate volume   CULT (A) 07/03/2022 0220    ESCHERICHIA COLI SUSCEPTIBILITIES PERFORMED ON PREVIOUS CULTURE WITHIN THE LAST 5 DAYS. Performed at [facility], [address], [place], [place] [address]    REPTSTATUS 07/05/2022 FINAL 07/03/2022 0220  Radiology Studies: NM GI Blood Loss  Result Date: 11/27/2022 CLINICAL DATA:  82 year old with painless hematochezia. EXAM: NUCLEAR MEDICINE GASTROINTESTINAL BLEEDING SCAN TECHNIQUE: Sequential abdominal images were obtained following intravenous administration of Tc-38m labeled red blood cells. RADIOPHARMACEUTICALS:  23.5 mCi Tc-71m pertechnetate in-vitro labeled red cells. COMPARISON:  11/18/2021 FINDINGS: Normal distribution of the radiopharmaceutical on the 1 hour and 2 hour imaging. No evidence for a GI bleed. IMPRESSION: No evidence for active gastrointestinal bleeding. Electronically Signed   By: Richarda Overlie M.D.   On: 11/27/2022 17:14     LOS: 1 day   Lanae Boast, MD [facility]  11/28/2022, 9:04 AM

## 2022-11-28 NOTE — Telephone Encounter (Signed)
Patient has been scheduled for a 54-month hospital follow up with Hyacinth Meeker, PA-C on Friday, 02/09/23 at 10 am.  Appt information mailed and sent to patient via MyChart.

## 2022-11-28 NOTE — Telephone Encounter (Signed)
-----   Message from Man X Mast sent at 11/28/2022 10:44 AM EDT ----- She was discharged from [facility], not sure who is her PCP presently.  Thanks. ----- Message ----- From: Maurice Small, CMA Sent: 11/28/2022   9:40 AM EDT To: Man X Mast, NP  Hello ManXie,  Is this our patient? I see several [facility] encounters however PCP is listed as   Plotnikov, Georgina Quint, MDPCP - General

## 2022-11-28 NOTE — Telephone Encounter (Signed)
Noted  

## 2022-11-28 NOTE — Anesthesia Preprocedure Evaluation (Addendum)
Anesthesia Evaluation  Patient identified by MRN, date of birth, ID band Patient awake    Reviewed: Allergy & Precautions, NPO status , Patient's Chart, lab work & pertinent test results  History of Anesthesia Complications Negative for: history of anesthetic complications  Airway Mallampati: III  TM Distance: <3 FB Neck ROM: Full   Comment: Previous grade I view with Miller 2, easy mask Dental  (+) Dental Advisory Given   Pulmonary neg shortness of breath, sleep apnea and Continuous Positive Airway Pressure Ventilation , COPD, neg recent URI, former smoker, PE S/p wedge resection   Pulmonary exam normal breath sounds clear to auscultation       Cardiovascular hypertension (carvedilol, clonidine, losartan), Pt. on home beta blockers and Pt. on medications (-) angina + CAD, + Past MI, + CABG (1996), + Peripheral Vascular Disease and + DVT   Rhythm:Regular Rate:Normal  HLD, AAA s/p repair in 1996  TTE 02/07/2022: IMPRESSIONS     1. Left ventricular ejection fraction, by estimation, is 65 to 70%. Left  ventricular ejection fraction by 3D volume is 68 %. The left ventricle has  normal function. The left ventricle has no regional wall motion  abnormalities. There is severe asymmetric   left ventricular hypertrophy. Left ventricular diastolic parameters are  consistent with Grade II diastolic dysfunction (pseudonormalization).   2. Right ventricular systolic function is normal. The right ventricular  size is normal. There is normal pulmonary artery systolic pressure.   3. The mitral valve is normal in structure. Trivial mitral valve  regurgitation. No evidence of mitral stenosis.   4. The aortic valve is tricuspid. There is mild thickening of the aortic  valve. Aortic valve regurgitation is not visualized.     Neuro/Psych neg Seizures PSYCHIATRIC DISORDERS Anxiety     TIA (11/2014)   GI/Hepatic Neg liver ROS, PUD,GERD   Medicated,,diverticulosis   Endo/Other  neg diabetesHypothyroidism    Renal/GU CRFRenal disease     Musculoskeletal  (+) Arthritis , Rheumatoid disorders,  PMR   Abdominal   Peds  Hematology  (+) Blood dyscrasia, anemia   Anesthesia Other Findings 82 y.o.f w/ RA,hypertension,hypothyroidism,CAD s/p CABG,CKD stage IV, PUD, and diverticulosis who presented to the ED with rectal bleeding x3 days of bright red and maroon blood per rectum. There has not been any abdominal pain, nausea, or vomiting associated with this.   Last Eliquis: 11/26/2022  Reproductive/Obstetrics                             Anesthesia Physical Anesthesia Plan  ASA: 3  Anesthesia Plan: MAC   Post-op Pain Management:    Induction: Intravenous  PONV Risk Score and Plan: 2 and Propofol infusion and Treatment may vary due to age or medical condition  Airway Management Planned: Natural Airway and Nasal Cannula  Additional Equipment:   Intra-op Plan:   Post-operative Plan:   Informed Consent: I have reviewed the patients History and Physical, chart, labs and discussed the procedure including the risks, benefits and alternatives for the proposed anesthesia with the patient or authorized representative who has indicated his/her understanding and acceptance.   Patient has DNR.  Discussed DNR with patient and Suspend DNR.   Dental advisory given  Plan Discussed with: CRNA and Anesthesiologist  Anesthesia Plan Comments: (Discussed with patient risks of MAC including, but not limited to, minor pain or discomfort, hearing people in the room, and possible need for backup general anesthesia. Risks for general anesthesia  also discussed including, but not limited to, sore throat, hoarse voice, chipped/damaged teeth, injury to vocal cords, nausea and vomiting, allergic reactions, lung infection, heart attack, stroke, and death. All questions answered. )        Anesthesia Quick  Evaluation

## 2022-11-28 NOTE — Op Note (Signed)
[facility] Patient Name: [name] Procedure Date : 11/28/2022 MRN: [id number] Attending MD: Particia Lather , , [id number] Date of Birth: 25-Feb-1941 CSN: [id number] Age: 82 Admit Type: Inpatient Procedure:                Upper GI endoscopy Indications:              Anemia Providers:                [name]" Jesus Genera, RN, Eliberto Ivory, RN, Melany Guernsey, Technician Referring MD:             Hospitalist team Medicines:                Monitored Anesthesia Care Complications:            No immediate complications. Estimated Blood Loss:     Estimated blood loss was minimal. Procedure:                Pre-Anesthesia Assessment:                           - Prior to the procedure, a History and Physical                            was performed, and patient medications and                            allergies were reviewed. The patient's tolerance of                            previous anesthesia was also reviewed. The risks                            and benefits of the procedure and the sedation                            options and risks were discussed with the patient.                            All questions were answered, and informed consent                            was obtained. Prior Anticoagulants: The patient has                            taken Eliquis (apixaban), last dose was 2 days                            prior to procedure. ASA Grade Assessment: III - A                            patient with severe systemic disease. After  reviewing the risks and benefits, the patient was                            deemed in satisfactory condition to undergo the                            procedure.                           After obtaining informed consent, the endoscope was                            passed under direct vision. Throughout the                            procedure, the  patient's blood pressure, pulse, and                            oxygen saturations were monitored continuously. The                            [id number]-[id number] ([id number] Olympus endoscope was introduced                            through the mouth, and advanced to the second part                            of duodenum. Scope In: Scope Out: Findings:      Salmon-colored mucosa was present. The maximum longitudinal extent of       these esophageal mucosal changes was 1 cm in length. Mucosa was biopsied       with a cold forceps for histology. One specimen bottle was sent to       pathology.      A hiatal hernia was present.      Localized mild inflammation characterized by congestion (edema),       erosions and erythema was found in the gastric body.      The examined duodenum was normal. Impression:               - Salmon-colored mucosa. Biopsied.                           - Hiatal hernia.                           - Gastritis.                           - Normal examined duodenum. Recommendation:           - Await pathology results.                           - Perform a colonoscopy today. Procedure Code(s):        --- Professional ---  16109, Esophagogastroduodenoscopy, flexible,                            transoral; with biopsy, single or multiple Diagnosis Code(s):        --- Professional ---                           K22.89, Other specified disease of esophagus                           K44.9, Diaphragmatic hernia without obstruction or                            gangrene                           D64.9, Anemia, unspecified CPT copyright 2022 [employer] Association. All rights reserved. The codes documented in this report are preliminary and upon coder review may  be revised to meet current compliance requirements. Dr Particia Lather "[name]" [name],  11/28/2022 1:58:43 PM Number of Addenda: 0

## 2022-11-29 DIAGNOSIS — K922 Gastrointestinal hemorrhage, unspecified: Secondary | ICD-10-CM | POA: Diagnosis not present

## 2022-11-29 LAB — HEMOGLOBIN AND HEMATOCRIT, BLOOD
HCT: 27.7 % — ABNORMAL LOW (ref 36.0–46.0)
Hemoglobin: 8.9 g/dL — ABNORMAL LOW (ref 12.0–15.0)

## 2022-11-29 MED ORDER — APIXABAN 2.5 MG PO TABS
2.5000 mg | ORAL_TABLET | Freq: Two times a day (BID) | ORAL | Status: DC
  Administered 2022-11-29: 2.5 mg via ORAL
  Filled 2022-11-29: qty 1

## 2022-11-29 NOTE — Discharge Summary (Signed)
Physician Discharge Summary  [name]:[id number] DOB: December 08, 1940 DOA: 11/26/2022  PCP: [name], MD  Admit date: 11/26/2022 Discharge date: 11/29/2022 Recommendations for Outpatient Follow-up:  Follow up with PCP in 1 weeks-call for appointment Please obtain BMP/CBC in one week  Discharge Dispo: home Discharge Condition: Stable Code Status:   Code Status: DNR Diet recommendation:  Diet Order             Diet Heart Room service appropriate? Yes; Fluid consistency: Thin  Diet effective now                    Brief/Interim Summary: 82 y.o.f w/ RA,hypertension,hypothyroidism,CAD s/p CABG,CKD stage IV, PUD, and diverticulosis who presented to the ED with rectal bleeding x3 days of bright red and maroon blood per rectum. There has not been any abdominal pain, nausea, or vomiting associated with this.  She has had multiple episodes where she feels the need to move her bowels but then passes mostly red and maroon blood.  She initially thought this was due to hemorrhoids but was having increased volume of bleeding last night which prompted her presentation. No NSAID use history. -last colonoscopy in 2017 showed SSA invading her appendiceal orifice, colon polyps, diverticula in the left colon and hemorrhoids and had colon resection for removal of SSA and appendiceal orifice EGD in March of this YEAR with a hiatal hernia and grade a esophagitis and 10 gastric ulcers and nonbleeding duodenal ulcer negative H. pylori, In the ED,  afebrile vital stable-BUN 80, creatinine 3.67, and hemoglobin 10.0. Type and screen was performed in the ED and secure chat was sent to Dr. Elnoria Howard of GI with request for routine consultation. Seen by GI- tagged RBC scan was negative for source of GI bleeding on 7/29 GI recommended EGD and colonoscopy for further evaluation. Underwent EGD and colonoscopy 7/30: EGD showed some mild gastritis, salmon colored mucosa in esophagus that was biopsied. Prior gastric  ulcers have healed. Colonoscopy with 3 small polyps that were removed, diverticulosis, hemorrhoids. Suspect her bleeding is likely due to a diverticular bleed that has now resolved. Patient had yellow stool on colonoscopy. Okay to restart her Eliquis 7/31 because polyps were removed - gi advised OP f/u     Discharge Diagnoses:  Principal Problem:   Acute GI bleeding Active Problems:   Acquired hypothyroidism   Essential hypertension   Rheumatoid arthritis (HCC)   COPD (chronic obstructive pulmonary disease) (HCC)   CKD (chronic kidney disease) stage 4, GFR 15-29 ml/min (HCC) - baseline SCr 2.5. follows with Dr. Malen Gauze with [facility]   History of pulmonary embolism   Chronic anticoagulation   Lower GI bleed GI bleeding w/ painless hematochezia:   Seen by GI- tagged RBC scan was negative for source of GI bleeding on 7/29.  Previously she has had EGD colonoscopy see result above. Underwent EGD and colonoscopy 7/30: EGD showed some mild gastritis, salmon colored mucosa in esophagus that was biopsied. Prior gastric ulcers have healed. Colonoscopy with 3 small polyps that were removed, diverticulosis, hemorrhoids. Suspect her bleeding is likely due to a diverticular bleed that has now resolved. Patient had yellow stool on colonoscopy. Okay to restart her Eliquis 7/31 because polyps were removed - gi advised OP f/u.  Rechecking H&H prior to discharge  and is at 8.9.  Patient denies any recurrence of bleeding, tolerating diet feels ready for discharge Recent Labs  Lab 11/27/22 1653 11/27/22 2251 11/28/22 0443 11/28/22 2358 11/29/22 0842  HGB 9.3*  10.2* 10.3* 8.8* 8.9*  HCT 29.6* 32.2* 32.6* 27.6* 27.7*    AKI superimposed on CKD IV: Creatinine about the same slightly worse than baseline continue to monitor.  Creatinine holding stable at baseline range of 3.3  Recent Labs    07/25/22 0533 08/01/22 0800 08/02/22 0918 08/02/22 0927 09/05/22 1155 09/30/22 1849 10/04/22 1300  11/27/22 0105 11/27/22 0506 11/28/22 0443 11/28/22 2358  BUN 62* 57* 56* 56* 60* 61* 49* 80* 81* 68* 61*  CREATININE 3.71* 3.3* 3.35* 3.60* 3.46* 3.30* 3.11* 3.67* 3.62* 3.26* 3.34*  CO2 21* 25* 23  --  32 21* 22 24 25  21* 21*    CAD:No angina.Continue Lipitor   Hypertension: Blood pressure poorly controlled, she is currently on clonidine 02 mg weekly placed on 7/28 and still has it, SBP remains high continue home prn clonidine, Coreg .  Monitor and adjust  Hx of DVT and PE: resume Eliquis    COPD: Not in exacerbation continue bronchodilators as needed  RA -stable on Leflunomide   Hypothyroidism: cont synthroid    Consults: GI Subjective: Alert awake oriented no recurrence of bleeding, she feels ready for discharge  Discharge Exam: Vitals:   11/29/22 0710 11/29/22 0735  BP: (!) 150/58 (!) 146/41  Pulse: 61 (!) 58  Resp: 18 16  Temp: 98.2 F (36.8 C) (!) 97.5 F (36.4 C)  SpO2: 96% 98%   General: Pt is alert, awake, not in acute distress Cardiovascular: RRR, S1/S2 +, no rubs, no gallops Respiratory: CTA bilaterally, no wheezing, no rhonchi Abdominal: Soft, NT, ND, bowel sounds + Extremities: no edema, no cyanosis  Discharge Instructions  Discharge Instructions     Discharge instructions   Complete by: As directed    Please call call MD or return to ER for similar or worsening recurring problem that brought you to hospital or if any fever,nausea/vomiting,abdominal pain, uncontrolled pain, chest pain,  shortness of breath or any other alarming symptoms.  Please follow-up your doctor as instructed in a week time and call the office for appointment.  Please avoid alcohol, smoking, or any other illicit substance and maintain healthy habits including taking your regular medications as prescribed.  You were cared for by a hospitalist during your hospital stay. If you have any questions about your discharge medications or the care you received while you were in the  hospital after you are discharged, you can call the unit and ask to speak with the hospitalist on call if the hospitalist that took care of you is not available.  Once you are discharged, your primary care physician will handle any further medical issues. Please note that NO REFILLS for any discharge medications will be authorized once you are discharged, as it is imperative that you return to your primary care physician (or establish a relationship with a primary care physician if you do not have one) for your aftercare needs so that they can reassess your need for medications and monitor your lab values   Increase activity slowly   Complete by: As directed       Allergies as of 11/29/2022       Reactions   Nsaids Other (See Comments)   Stomach upset Told to avoid due to Eliquis   Aspirin Other (See Comments)   Stomach upset Told to avoid due to Eliquis   Codeine Nausea And Vomiting   Nitrostat [nitroglycerin] Other (See Comments)   Bradycardia. Drop in heart rate         Medication List  TAKE these medications    acetaminophen 325 MG tablet Commonly known as: TYLENOL Take 2 tablets (650 mg total) by mouth 2 (two) times daily as needed (generalized pain).   albuterol 108 (90 Base) MCG/ACT inhaler Commonly known as: VENTOLIN HFA Inhale 1-2 puffs into the lungs every 4 (four) hours as needed for wheezing or shortness of breath.   ALLERGY 24-HR PO Take 1 tablet by mouth in the morning and at bedtime.   apixaban 2.5 MG Tabs tablet Commonly known as: ELIQUIS Take 1 tablet (2.5 mg total) by mouth 2 (two) times daily.   ascorbic acid 500 MG tablet Commonly known as: VITAMIN C Take 1 tablet (500 mg total) by mouth daily.   atorvastatin 80 MG tablet Commonly known as: LIPITOR Take 1 tablet (80 mg total) by mouth daily.   carvedilol 6.25 MG tablet Commonly known as: COREG Take 6.25 mg by mouth 2 (two) times daily with a meal.   carvedilol 3.125 MG tablet Commonly  known as: COREG Take 3.125 mg by mouth 2 (two) times daily with a meal.   CENTRUM ADULT PO Take 1 tablet by mouth daily. 1 Tablet Daily.   cloNIDine 0.1 mg/24hr patch Commonly known as: CATAPRES - Dosed in mg/24 hr Place 1 patch (0.1 mg total) onto the skin once a week. What changed: how much to take   cloNIDine 0.2 MG tablet Commonly known as: CATAPRES Take 1 tablet (0.2 mg total) by mouth 4 (four) times daily as needed for up to 30 doses. For systolic blood pressure (top number) over 200 mmhg   clotrimazole-betamethasone cream Commonly known as: Lotrisone Apply 1 Application topically 2 (two) times daily. Mouth corner rash   Cyanocobalamin 2500 MCG Tabs Take 2,500 mcg by mouth daily.   docusate sodium 100 MG capsule Commonly known as: Colace Take 1-2 capsules (100-200 mg total) by mouth daily as needed for mild constipation.   Golimumab 50 MG/0.5ML Soaj Take 50 mg by mouth every 2 (two) months. Hold for the next 2 weeks per Orthopedic surgery recommendations.   leflunomide 20 MG tablet Commonly known as: ARAVA Take 1 tablet (20 mg total) by mouth daily. What changed: how much to take   levothyroxine 88 MCG tablet Commonly known as: SYNTHROID Take 1 tablet (88 mcg total) by mouth daily before breakfast.   losartan 50 MG tablet Commonly known as: COZAAR Take 1 tablet (50 mg total) by mouth daily. Keep Aug appt for future refills   pantoprazole 40 MG tablet Commonly known as: PROTONIX Take 1 tablet (40 mg total) by mouth daily.   polyethylene glycol 17 g packet Commonly known as: MIRALAX / GLYCOLAX Take 17 g by mouth daily as needed.   sertraline 50 MG tablet Commonly known as: ZOLOFT Take 1 tablet (50 mg total) by mouth daily. What changed: how much to take   triamcinolone 55 MCG/ACT Aero nasal inhaler Commonly known as: NASACORT Place 1 spray into the nose daily.   Vitamin D3 50 MCG (2000 UT) capsule Take 1 capsule (2,000 Units total) by mouth daily.         Follow-up Information     Plotnikov, Georgina Quint, MD Follow up in 1 week(s).   Specialty: Internal Medicine Contact information: [address] [place] [address] [phone]                Allergies  Allergen Reactions   Nsaids Other (See Comments)    Stomach upset Told to avoid due to Eliquis  Aspirin Other (See Comments)    Stomach upset Told to avoid due to Eliquis   Codeine Nausea And Vomiting   Nitrostat [Nitroglycerin] Other (See Comments)    Bradycardia. Drop in heart rate     The results of significant diagnostics from this hospitalization (including imaging, microbiology, ancillary and laboratory) are listed below for reference.    Microbiology: No results found for this or any previous visit (from the past 240 hour(s)).  Procedures/Studies: NM GI Blood Loss  Result Date: 11/27/2022 CLINICAL DATA:  82 year old with painless hematochezia. EXAM: NUCLEAR MEDICINE GASTROINTESTINAL BLEEDING SCAN TECHNIQUE: Sequential abdominal images were obtained following intravenous administration of Tc-94m labeled red blood cells. RADIOPHARMACEUTICALS:  23.5 mCi Tc-53m pertechnetate in-vitro labeled red cells. COMPARISON:  11/18/2021 FINDINGS: Normal distribution of the radiopharmaceutical on the 1 hour and 2 hour imaging. No evidence for a GI bleed. IMPRESSION: No evidence for active gastrointestinal bleeding. Electronically Signed   By: Richarda Overlie M.D.   On: 11/27/2022 17:14    Labs: BNP (last 3 results) Recent Labs    03/09/22 0540 07/08/22 0553 07/12/22 0826  BNP 265.5* 382.7* 865.3*   Basic Metabolic Panel: Recent Labs  Lab 11/27/22 0105 11/27/22 0506 11/28/22 0443 11/28/22 2358  NA 138 137 139 134*  K 4.3 4.4 4.6 3.8  CL 102 104 105 103  CO2 24 25 21* 21*  GLUCOSE 110* 100* 119* 124*  BUN 80* 81* 68* 61*  CREATININE 3.67* 3.62* 3.26* 3.34*  CALCIUM 9.6 9.5 9.7 8.9   Recent Labs  Lab 11/27/22 0105 11/27/22 0506 11/27/22 1653  11/27/22 2251 11/28/22 0443 11/28/22 2358 11/29/22 0842  WBC 8.5  --   --   --  9.2 7.7  --   NEUTROABS 4.4  --   --   --   --   --   --   HGB 10.0*   < > 9.3* 10.2* 10.3* 8.8* 8.9*  HCT 32.0*   < > 29.6* 32.2* 32.6* 27.6* 27.7*  MCV 101.3*  --   --   --  99.4 96.2  --   PLT 227  --   --   --  256 215  --    < > = values in this interval not displayed.   No results for input(s): "VITAMINB12", "FOLATE", "FERRITIN", "TIBC", "IRON", "RETICCTPCT" in the last 72 hours. Urinalysis    Component Value Date/Time   COLORURINE YELLOW 09/05/2022 1155   APPEARANCEUR CLEAR 09/05/2022 1155   LABSPEC <=1.005 (A) 09/05/2022 1155   PHURINE 6.0 09/05/2022 1155   GLUCOSEU NEGATIVE 09/05/2022 1155   HGBUR SMALL (A) 09/05/2022 1155   BILIRUBINUR NEGATIVE 09/05/2022 1155   KETONESUR NEGATIVE 09/05/2022 1155   PROTEINUR 100 (A) 07/22/2022 0805   UROBILINOGEN 0.2 09/05/2022 1155   NITRITE NEGATIVE 09/05/2022 1155   LEUKOCYTESUR NEGATIVE 09/05/2022 1155   Sepsis Labs Recent Labs  Lab 11/27/22 0105 11/28/22 0443 11/28/22 2358  WBC 8.5 9.2 7.7   Microbiology No results found for this or any previous visit (from the past 240 hour(s)).   Time coordinating discharge: 25 minutes  SIGNED: Lanae Boast, MD  [facility] 11/29/2022, 9:55 AM  If 7PM-7AM, please contact night-coverage [email or web address]

## 2022-11-29 NOTE — Plan of Care (Signed)

## 2022-11-30 ENCOUNTER — Encounter: Payer: Self-pay | Admitting: Internal Medicine

## 2022-11-30 ENCOUNTER — Telehealth: Payer: Self-pay | Admitting: *Deleted

## 2022-11-30 ENCOUNTER — Ambulatory Visit: Payer: Medicare PPO | Admitting: Internal Medicine

## 2022-11-30 ENCOUNTER — Encounter (HOSPITAL_BASED_OUTPATIENT_CLINIC_OR_DEPARTMENT_OTHER): Payer: Medicare PPO | Admitting: Pulmonary Disease

## 2022-11-30 ENCOUNTER — Encounter: Payer: Self-pay | Admitting: *Deleted

## 2022-11-30 VITALS — BP 150/60 | HR 65 | Temp 98.6°F | Ht 63.0 in | Wt 132.0 lb

## 2022-11-30 DIAGNOSIS — G459 Transient cerebral ischemic attack, unspecified: Secondary | ICD-10-CM

## 2022-11-30 DIAGNOSIS — M069 Rheumatoid arthritis, unspecified: Secondary | ICD-10-CM

## 2022-11-30 DIAGNOSIS — K625 Hemorrhage of anus and rectum: Secondary | ICD-10-CM | POA: Diagnosis not present

## 2022-11-30 DIAGNOSIS — I120 Hypertensive chronic kidney disease with stage 5 chronic kidney disease or end stage renal disease: Secondary | ICD-10-CM | POA: Diagnosis not present

## 2022-11-30 DIAGNOSIS — N184 Chronic kidney disease, stage 4 (severe): Secondary | ICD-10-CM

## 2022-11-30 DIAGNOSIS — D631 Anemia in chronic kidney disease: Secondary | ICD-10-CM | POA: Diagnosis not present

## 2022-11-30 DIAGNOSIS — I2510 Atherosclerotic heart disease of native coronary artery without angina pectoris: Secondary | ICD-10-CM | POA: Diagnosis not present

## 2022-11-30 DIAGNOSIS — K922 Gastrointestinal hemorrhage, unspecified: Secondary | ICD-10-CM | POA: Diagnosis not present

## 2022-11-30 DIAGNOSIS — N185 Chronic kidney disease, stage 5: Secondary | ICD-10-CM | POA: Diagnosis not present

## 2022-11-30 DIAGNOSIS — Z9889 Other specified postprocedural states: Secondary | ICD-10-CM | POA: Diagnosis not present

## 2022-11-30 DIAGNOSIS — E875 Hyperkalemia: Secondary | ICD-10-CM | POA: Diagnosis not present

## 2022-11-30 NOTE — Progress Notes (Signed)
Subjective:  Patient ID: [name], female    DOB: 1941/03/22  Age: 82 y.o. MRN: [id number]  CC: No chief complaint on file.   HPI [name] presents for rectal bleeding, history of TIA, hypertension, renal insufficiency  Admit date: 11/26/2022 Discharge date: 11/29/2022 Recommendations for Outpatient Follow-up:  Follow up with PCP in 1 weeks-call for appointment Please obtain BMP/CBC in one week   Discharge Dispo: home Discharge Condition: Stable Code Status:   Code Status: DNR Diet recommendation:  Diet Order                  Diet Heart Room service appropriate? Yes; Fluid consistency: Thin  Diet effective now                         Brief/Interim Summary: 82 y.o.f w/ RA,hypertension,hypothyroidism,CAD s/p CABG,CKD stage IV, PUD, and diverticulosis who presented to the ED with rectal bleeding x3 days of bright red and maroon blood per rectum. There has not been any abdominal pain, nausea, or vomiting associated with this.  She has had multiple episodes where she feels the need to move her bowels but then passes mostly red and maroon blood.  She initially thought this was due to hemorrhoids but was having increased volume of bleeding last night which prompted her presentation. No NSAID use history. -last colonoscopy in 2017 showed SSA invading her appendiceal orifice, colon polyps, diverticula in the left colon and hemorrhoids and had colon resection for removal of SSA and appendiceal orifice EGD in March of this YEAR with a hiatal hernia and grade a esophagitis and 10 gastric ulcers and nonbleeding duodenal ulcer negative H. pylori, In the ED,  afebrile vital stable-BUN 80, creatinine 3.67, and hemoglobin 10.0. Type and screen was performed in the ED and secure chat was sent to Dr. Elnoria Howard of GI with request for routine consultation. Seen by GI- tagged RBC scan was negative for source of GI bleeding on 7/29 GI recommended EGD and colonoscopy for further  evaluation. Underwent EGD and colonoscopy 7/30: EGD showed some mild gastritis, salmon colored mucosa in esophagus that was biopsied. Prior gastric ulcers have healed. Colonoscopy with 3 small polyps that were removed, diverticulosis, hemorrhoids. Suspect her bleeding is likely due to a diverticular bleed that has now resolved. Patient had yellow stool on colonoscopy. Okay to restart her Eliquis 7/31 because polyps were removed - gi advised OP f/u      Discharge Diagnoses:  Principal Problem:   Acute GI bleeding Active Problems:   Acquired hypothyroidism   Essential hypertension   Rheumatoid arthritis (HCC)   COPD (chronic obstructive pulmonary disease) (HCC)   CKD (chronic kidney disease) stage 4, GFR 15-29 ml/min (HCC) - baseline SCr 2.5. follows with Dr. Malen Gauze with [facility]   History of pulmonary embolism   Chronic anticoagulation   Lower GI bleed GI bleeding w/ painless hematochezia:   Seen by GI- tagged RBC scan was negative for source of GI bleeding on 7/29.  Previously she has had EGD colonoscopy see result above. Underwent EGD and colonoscopy 7/30: EGD showed some mild gastritis, salmon colored mucosa in esophagus that was biopsied. Prior gastric ulcers have healed. Colonoscopy with 3 small polyps that were removed, diverticulosis, hemorrhoids. Suspect her bleeding is likely due to a diverticular bleed that has now resolved. Patient had yellow stool on colonoscopy. Okay to restart her Eliquis 7/31 because polyps were removed - gi advised OP f/u.  Rechecking H&H prior  to discharge  and is at 8.9.  Patient denies any recurrence of bleeding, tolerating diet feels ready for discharge Last Labs         Recent Labs  Lab 11/27/22 1653 11/27/22 2251 11/28/22 0443 11/28/22 2358 11/29/22 0842  HGB 9.3* 10.2* 10.3* 8.8* 8.9*  HCT 29.6* 32.2* 32.6* 27.6* 27.7*      AKI superimposed on CKD IV: Creatinine about the same slightly worse than baseline continue to monitor.  Creatinine  holding stable at baseline range of 3.3  Recent Labs (within last 365 days)               Recent Labs    07/25/22 0533 08/01/22 0800 08/02/22 0918 08/02/22 0927 09/05/22 1155 09/30/22 1849 10/04/22 1300 11/27/22 0105 11/27/22 0506 11/28/22 0443 11/28/22 2358  BUN 62* 57* 56* 56* 60* 61* 49* 80* 81* 68* 61*  CREATININE 3.71* 3.3* 3.35* 3.60* 3.46* 3.30* 3.11* 3.67* 3.62* 3.26* 3.34*  CO2 21* 25* 23  --  32 21* 22 24 25  21* 21*      CAD:No angina.Continue Lipitor    Hypertension: Blood pressure poorly controlled, she is currently on clonidine 02 mg weekly placed on 7/28 and still has it, SBP remains high continue home prn clonidine, Coreg .  Monitor and adjust   Hx of DVT and PE: resume Eliquis    COPD: Not in exacerbation continue bronchodilators as needed   RA -stable on Leflunomide    Hypothyroidism: cont synthroid      Outpatient Medications Prior to Visit  Medication Sig Dispense Refill   acetaminophen (TYLENOL) 325 MG tablet Take 2 tablets (650 mg total) by mouth 2 (two) times daily as needed (generalized pain). 100 tablet 2   albuterol (VENTOLIN HFA) 108 (90 Base) MCG/ACT inhaler Inhale 1-2 puffs into the lungs every 4 (four) hours as needed for wheezing or shortness of breath. 1 each 2   ascorbic acid (VITAMIN C) 500 MG tablet Take 1 tablet (500 mg total) by mouth daily. 30 tablet 2   atorvastatin (LIPITOR) 80 MG tablet Take 1 tablet (80 mg total) by mouth daily. 90 tablet 1   Cholecalciferol (VITAMIN D3) 50 MCG (2000 UT) capsule Take 1 capsule (2,000 Units total) by mouth daily. 100 capsule 3   cloNIDine (CATAPRES) 0.2 MG tablet Take 1 tablet (0.2 mg total) by mouth 4 (four) times daily as needed for up to 30 doses. For systolic blood pressure (top number) over 200 mmhg (Patient taking differently: Take 0.2 mg by mouth 4 (four) times daily as needed (For systolic blood pressure (top number) over 200 mmhg).) 100 tablet 3   Cyanocobalamin 2500 MCG TABS Take 2,500 mcg  by mouth daily. 30 tablet 3   Fexofenadine HCl (ALLERGY 24-HR PO) Take 1 tablet by mouth in the morning and at bedtime.     leflunomide (ARAVA) 20 MG tablet Take 1 tablet (20 mg total) by mouth daily. 30 tablet 1   levothyroxine (SYNTHROID) 88 MCG tablet Take 1 tablet (88 mcg total) by mouth daily before breakfast. 90 tablet 1   Multiple Vitamins-Minerals (CENTRUM ADULT PO) Take 1 tablet by mouth daily. 1 Tablet Daily.     pantoprazole (PROTONIX) 40 MG tablet Take 1 tablet (40 mg total) by mouth daily. 180 tablet 1   triamcinolone (NASACORT) 55 MCG/ACT AERO nasal inhaler Place 1 spray into the nose daily. 1 each 3   apixaban (ELIQUIS) 2.5 MG TABS tablet Take 1 tablet (2.5 mg total) by mouth 2 (two) times  daily. 180 tablet 1   carvedilol (COREG) 3.125 MG tablet Take 3.125 mg by mouth 2 (two) times daily with a meal.     cloNIDine (CATAPRES - DOSED IN MG/24 HR) 0.1 mg/24hr patch Place 1 patch (0.1 mg total) onto the skin once a week. (Patient taking differently: Place 0.2 mg onto the skin once a week. Applies every Sunday.) 4 patch 12   losartan (COZAAR) 50 MG tablet Take 1 tablet (50 mg total) by mouth daily. Keep Aug appt for future refills 90 tablet 0   sertraline (ZOLOFT) 50 MG tablet Take 1 tablet (50 mg total) by mouth daily. (Patient taking differently: Take 25 mg by mouth daily.) 90 tablet 3   carvedilol (COREG) 6.25 MG tablet Take 6.25 mg by mouth 2 (two) times daily with a meal.     clotrimazole-betamethasone (LOTRISONE) cream Apply 1 Application topically 2 (two) times daily. Mouth corner rash (Patient not taking: Reported on 11/27/2022) 30 g 3   docusate sodium (COLACE) 100 MG capsule Take 1-2 capsules (100-200 mg total) by mouth daily as needed for mild constipation. (Patient not taking: Reported on 11/27/2022) 100 capsule 1   Golimumab 50 MG/0.5ML SOAJ Take 50 mg by mouth every 2 (two) months. Hold for the next 2 weeks per Orthopedic surgery recommendations. (Patient not taking: Reported on  11/27/2022) 0.3 mL    polyethylene glycol (MIRALAX / GLYCOLAX) 17 g packet Take 17 g by mouth daily as needed. (Patient not taking: Reported on 11/27/2022) 14 each 1   No facility-administered medications prior to visit.    ROS: Review of Systems  Constitutional:  Negative for activity change, appetite change, chills, fatigue and unexpected weight change.  HENT:  Negative for congestion, mouth sores and sinus pressure.   Eyes:  Negative for visual disturbance.  Respiratory:  Negative for cough and chest tightness.   Gastrointestinal:  Negative for abdominal pain and nausea.  Genitourinary:  Negative for difficulty urinating, frequency and vaginal pain.  Musculoskeletal:  Negative for back pain and gait problem.  Skin:  Negative for pallor and rash.  Neurological:  Negative for dizziness, tremors, weakness, numbness and headaches.  Psychiatric/Behavioral:  Negative for confusion and sleep disturbance.     Objective:  BP (!) 150/60 (BP Location: Left Arm, Patient Position: Sitting, Cuff Size: Normal)   Pulse 65   Temp 98.6 F (37 C) (Oral)   Ht 5\' 3"  (1.6 m)   Wt 132 lb (59.9 kg)   SpO2 95%   BMI 23.38 kg/m   BP Readings from Last 3 Encounters:  12/10/22 (!) 179/62  12/06/22 (!) 147/52  11/30/22 (!) 150/60    Wt Readings from Last 3 Encounters:  12/08/22 128 lb (58.1 kg)  11/30/22 132 lb (59.9 kg)  11/29/22 131 lb 6.3 oz (59.6 kg)    Physical Exam Constitutional:      General: She is not in acute distress.    Appearance: She is well-developed.  HENT:     Head: Normocephalic.     Right Ear: External ear normal.     Left Ear: External ear normal.     Nose: Nose normal.  Eyes:     General:        Right eye: No discharge.        Left eye: No discharge.     Conjunctiva/sclera: Conjunctivae normal.     Pupils: Pupils are equal, round, and reactive to light.  Neck:     Thyroid: No thyromegaly.     Vascular:  No JVD.     Trachea: No tracheal deviation.   Cardiovascular:     Rate and Rhythm: Normal rate and regular rhythm.     Heart sounds: Normal heart sounds.  Pulmonary:     Effort: No respiratory distress.     Breath sounds: No stridor. No wheezing.  Abdominal:     General: Bowel sounds are normal. There is no distension.     Palpations: Abdomen is soft. There is no mass.     Tenderness: There is no abdominal tenderness. There is no guarding or rebound.  Musculoskeletal:        General: No tenderness.     Cervical back: Normal range of motion and neck supple. No rigidity.  Lymphadenopathy:     Cervical: No cervical adenopathy.  Skin:    Findings: No erythema or rash.  Neurological:     Cranial Nerves: No cranial nerve deficit.     Motor: No abnormal muscle tone.     Coordination: Coordination normal.     Deep Tendon Reflexes: Reflexes normal.  Psychiatric:        Behavior: Behavior normal.        Thought Content: Thought content normal.        Judgment: Judgment normal.     Lab Results  Component Value Date   WBC 10.3 12/10/2022   HGB 8.1 (L) 12/10/2022   HCT 26.4 (L) 12/10/2022   PLT 354 12/10/2022   GLUCOSE 114 (H) 12/09/2022   CHOL 157 09/07/2020   TRIG 121.0 09/07/2020   HDL 59.30 09/07/2020   LDLDIRECT 162.1 10/28/2009   LDLCALC 74 09/07/2020   ALT 17 12/07/2022   AST 29 12/07/2022   NA 138 12/09/2022   K 4.5 12/09/2022   CL 104 12/09/2022   CREATININE 3.54 (H) 12/09/2022   BUN 62 (H) 12/09/2022   CO2 21 (L) 12/09/2022   TSH 2.94 09/05/2022   INR 1.2 08/02/2022    NM GI Blood Loss  Result Date: 11/27/2022 CLINICAL DATA:  82 year old with painless hematochezia. EXAM: NUCLEAR MEDICINE GASTROINTESTINAL BLEEDING SCAN TECHNIQUE: Sequential abdominal images were obtained following intravenous administration of Tc-55m labeled red blood cells. RADIOPHARMACEUTICALS:  23.5 mCi Tc-58m pertechnetate in-vitro labeled red cells. COMPARISON:  11/18/2021 FINDINGS: Normal distribution of the radiopharmaceutical on the 1  hour and 2 hour imaging. No evidence for a GI bleed. IMPRESSION: No evidence for active gastrointestinal bleeding. Electronically Signed   By: Richarda Overlie M.D.   On: 11/27/2022 17:14    Assessment & Plan:   Problem List Items Addressed This Visit     Rheumatoid arthritis (HCC) (Chronic)    Follow-up with Dr.Hawkes      TIA (transient ischemic attack)    No relapse Continue on amlodipine, Coreg, Demadex, clonidine Plavix      Anemia    On Procrit injections      Rectal bleeding - Primary    Lorella Nimrod tells me that rectal bleeding is listed as one of the side effect for Metamucil.  D/c Metamucil for now         No orders of the defined types were placed in this encounter.     Follow-up: Return in about 2 months (around 01/30/2023) for a follow-up visit.  Sonda Primes, MD

## 2022-11-30 NOTE — Assessment & Plan Note (Signed)
On Procrit injections

## 2022-11-30 NOTE — Assessment & Plan Note (Addendum)
[name] tells me that rectal bleeding is listed as one of the side effect for Metamucil.  D/c Metamucil for now

## 2022-11-30 NOTE — Transitions of Care (Post Inpatient/ED Visit) (Signed)
11/30/2022  Name: [name] MRN: [id number] DOB: 1941-04-22  Today's TOC FU Call Status: Today's TOC FU Call Status:: Successful TOC FU Call Completed TOC FU Call Complete Date: 11/30/22  Transition Care Management Follow-up Telephone Call Date of Discharge: 11/29/22 Discharge Facility: [facility]) Type of Discharge: Inpatient Admission Primary Inpatient Discharge Diagnosis:: GI bleeding How have you been since you were released from the hospital?: Better ("I am better; the bleeding has stopped and I have seen my PCP and my renal doctor earlier today.  I am doing fine") Any questions or concerns?: No  Items Reviewed: Did you receive and understand the discharge instructions provided?: Yes (thoroughly reviewed with patient who verbalizes good understanding of same) Medications obtained,verified, and reconciled?: No Medications Not Reviewed Reasons:: Other: (patient declined-- confirms medications were reviewed at time of PCP and renal provider office visits earier today) Any new allergies since your discharge?: No Dietary orders reviewed?: Yes Type of Diet Ordered:: "Healthy" Do you have support at home?: Yes People in Home: spouse Name of Support/Comfort Primary Source: Reports independent in self-care activities; lives with supportive spouse assists as/ if needed/ indicated ; patient and spouse reside at [facility]  Medications Reviewed Today: Medications Reviewed Today     Reviewed by Michaela Corner, RN (Registered Nurse) on 11/30/22 at 1308  Med List Status: <None>   Medication Order Taking? Sig Documenting Provider Last Dose Status Informant  acetaminophen (TYLENOL) 325 MG tablet [id number] No Take 2 tablets (650 mg total) by mouth 2 (two) times daily as needed (generalized pain). Mast, Man X, NP Taking Active Self, Multiple Informants           Med Note Michaela Corner   Thu Nov 30, 2022  1:08 PM) 11/30/22: Declined medication review during [facility] call-- confirms  were reviewed at PCP and renal provider office visits earlier today  albuterol (VENTOLIN HFA) 108 (90 Base) MCG/ACT inhaler [id number] No Inhale 1-2 puffs into the lungs every 4 (four) hours as needed for wheezing or shortness of breath. Mast, Man X, NP Taking Active Self, Multiple Informants  apixaban (ELIQUIS) 2.5 MG TABS tablet [id number] No Take 1 tablet (2.5 mg total) by mouth 2 (two) times daily. Mast, Man X, NP Taking Active Self, Multiple Informants  ascorbic acid (VITAMIN C) 500 MG tablet [id number] No Take 1 tablet (500 mg total) by mouth daily. Mast, Man X, NP Taking Active Self, Multiple Informants  atorvastatin (LIPITOR) 80 MG tablet [id number] No Take 1 tablet (80 mg total) by mouth daily. Mast, Man X, NP Taking Active Self, Multiple Informants  carvedilol (COREG) 6.25 MG tablet [id number] No Take 6.25 mg by mouth 2 (two) times daily with a meal.  Patient not taking: Reported on 11/27/2022   [provider] Not Taking Active Self, Multiple Informants  Cholecalciferol (VITAMIN D3) 50 MCG (2000 UT) capsule [id number] No Take 1 capsule (2,000 Units total) by mouth daily. Mast, Man X, NP Taking Active Self, Multiple Informants  cloNIDine (CATAPRES - DOSED IN MG/24 HR) 0.1 mg/24hr patch [id number] No Place 1 patch (0.1 mg total) onto the skin once a week.  Patient taking differently: Place 0.2 mg onto the skin once a week.   Plotnikov, Georgina Quint, MD Taking Active Self, Multiple Informants  cloNIDine (CATAPRES) 0.2 MG tablet [id number] No Take 1 tablet (0.2 mg total) by mouth 4 (four) times daily as needed for up to 30 doses. For systolic blood pressure (top number) over 200 mmhg Plotnikov, Georgina Quint,  MD Taking Active Self, Multiple Informants  clotrimazole-betamethasone (LOTRISONE) cream [id number] No Apply 1 Application topically 2 (two) times daily. Mouth corner rash  Patient not taking: Reported on 11/27/2022   Plotnikov, Georgina Quint, MD Not Taking Active Self, Multiple Informants            Med Note Kandis Cocking Alinda Dooms A   Mon Nov 27, 2022  5:19 PM) Patient states she stopped taking due to yeast infection.  Cyanocobalamin 2500 MCG TABS [id number] No Take 2,500 mcg by mouth daily. Mast, Man X, NP Taking Active Self, Multiple Informants  docusate sodium (COLACE) 100 MG capsule [id number] No Take 1-2 capsules (100-200 mg total) by mouth daily as needed for mild constipation.  Patient not taking: Reported on 11/27/2022   Mast, Man X, NP Not Taking Active Self, Multiple Informants  Fexofenadine HCl (ALLERGY 24-HR PO) [id number] No Take 1 tablet by mouth in the morning and at bedtime. [provider] Taking Active Self, Multiple Informants  Golimumab 50 MG/0.5ML SOAJ [id number] No Take 50 mg by mouth every 2 (two) months. Hold for the next 2 weeks per Orthopedic surgery recommendations.  Patient not taking: Reported on 11/27/2022   Mast, Man X, NP Not Taking Active Self, Multiple Informants           Med Note Michaell Cowing   Mon Aug 07, 2022 10:40 AM)    leflunomide (ARAVA) 20 MG tablet [id number] No Take 1 tablet (20 mg total) by mouth daily.  Patient taking differently: Take 10 mg by mouth daily.   Mast, Man X, NP Taking Active Self, Multiple Informants  levothyroxine (SYNTHROID) 88 MCG tablet [id number] No Take 1 tablet (88 mcg total) by mouth daily before breakfast. Mast, Man X, NP Taking Active Self, Multiple Informants  losartan (COZAAR) 50 MG tablet [id number] No Take 1 tablet (50 mg total) by mouth daily. Keep Aug appt for future refills Plotnikov, Georgina Quint, MD Taking Active Self, Multiple Informants  Multiple Vitamins-Minerals (CENTRUM ADULT PO) [id number] No Take 1 tablet by mouth daily. 1 Tablet Daily. [provider] Taking Active Self, Multiple Informants  pantoprazole (PROTONIX) 40 MG tablet [id number] No Take 1 tablet (40 mg total) by mouth daily. Mast, Man X, NP Taking Active Self, Multiple Informants  polyethylene glycol (MIRALAX / GLYCOLAX) 17 g packet  [id number] No Take 17 g by mouth daily as needed.  Patient not taking: Reported on 11/27/2022   Mast, Man X, NP Not Taking Active Self, Multiple Informants  sertraline (ZOLOFT) 50 MG tablet [id number] No Take 1 tablet (50 mg total) by mouth daily.  Patient taking differently: Take 25 mg by mouth daily.   Plotnikov, Georgina Quint, MD Taking Active Self, Multiple Informants           Med Note Kandis Cocking Alinda Dooms A   Mon Nov 27, 2022  5:22 PM) Patient reports splitting 50mg  tablet and takes 1/2 making it 25mg .  triamcinolone (NASACORT) 55 MCG/ACT AERO nasal inhaler [id number] No Place 1 spray into the nose daily. Mast, Man X, NP Taking Active Self, Multiple Informants           Home Care and Equipment/Supplies: Were Home Health Services Ordered?: No Any new equipment or medical supplies ordered?: No  Functional Questionnaire: Do you need assistance with bathing/showering or dressing?: No Do you need assistance with meal preparation?: No Do you need assistance with eating?: No Do you have difficulty maintaining continence: No Do you need assistance with getting out of bed/getting out of a chair/moving?:  No Do you have difficulty managing or taking your medications?: No  Follow up appointments reviewed: PCP Follow-up appointment confirmed?: Yes Date of PCP follow-up appointment?: 11/30/22 (confirmed attended today as scheduled; reviewed office visit with patient who denies concerns/ questions post-visit) Follow-up Provider: PCP Specialist Hospital Follow-up appointment confirmed?: Yes Date of Specialist follow-up appointment?: 11/30/22 (confirmed attended today as scheduled; reviewed office visit with patient who denies concerns/ questions post-visit) Follow-Up Specialty Provider:: renal provider Do you need transportation to your follow-up appointment?: No Do you understand care options if your condition(s) worsen?: Yes-patient verbalized understanding  SDOH Interventions Today     Flowsheet Row Most Recent Value  SDOH Interventions   Food Insecurity Interventions Intervention Not Indicated  Transportation Interventions Intervention Not Indicated  [husband provides transportation]      TOC Interventions Today    Flowsheet Row Most Recent Value  TOC Interventions   TOC Interventions Discussed/Reviewed TOC Interventions Discussed      Interventions Today    Flowsheet Row Most Recent Value  Chronic Disease   Chronic disease during today's visit Other  [lower GI bleeding]  General Interventions   General Interventions Discussed/Reviewed General Interventions Discussed, Doctor Visits, Communication with, Durable Medical Equipment (DME)  Doctor Visits Discussed/Reviewed Doctor Visits Discussed, Specialist, PCP  Durable Medical Equipment (DME) Val Riles currently requiring/ using assistive devices prn-- uses walker when she goes outside of home- reports does not need when she is at home]  PCP/Specialist Visits Compliance with follow-up visit  [confirmed attended office visits earlier today]  Communication with RN  Edilia Bo RN CM Care Coordinator currently involved and active- patient has next follow up telephone visit with RN CM on 01/15/23 and denies need for sooner outreach,  made RN CM aware of successful TOC call as FYI]  Exercise Interventions   Exercise Discussed/Reviewed Exercise Discussed  [reports participates in established PT sessions as outpatient, as prior to recent hospitalization]  Education Interventions   Provided Verbal Education On Other  [reinforced signs/ symptoms/ action plan for GI bleeding]  Nutrition Interventions   Nutrition Discussed/Reviewed Nutrition Discussed  Pharmacy Interventions   Pharmacy Dicussed/Reviewed Pharmacy Topics Discussed      Caryl Pina, RN, BSN, CCRN Alumnus RN CM Care Coordination/ Transition of Care- [facility] Care Management [phone]: direct office

## 2022-12-01 ENCOUNTER — Encounter (HOSPITAL_COMMUNITY): Payer: Self-pay | Admitting: Internal Medicine

## 2022-12-05 DIAGNOSIS — M6281 Muscle weakness (generalized): Secondary | ICD-10-CM | POA: Diagnosis not present

## 2022-12-05 DIAGNOSIS — Z9181 History of falling: Secondary | ICD-10-CM | POA: Diagnosis not present

## 2022-12-05 DIAGNOSIS — R278 Other lack of coordination: Secondary | ICD-10-CM | POA: Diagnosis not present

## 2022-12-06 ENCOUNTER — Ambulatory Visit (HOSPITAL_COMMUNITY): Admission: RE | Admit: 2022-12-06 | Payer: Medicare PPO | Source: Ambulatory Visit

## 2022-12-06 VITALS — BP 147/52 | HR 110 | Temp 97.3°F | Resp 17

## 2022-12-06 DIAGNOSIS — K625 Hemorrhage of anus and rectum: Secondary | ICD-10-CM | POA: Diagnosis not present

## 2022-12-06 DIAGNOSIS — R059 Cough, unspecified: Secondary | ICD-10-CM | POA: Diagnosis present

## 2022-12-06 DIAGNOSIS — M353 Polymyalgia rheumatica: Secondary | ICD-10-CM | POA: Diagnosis present

## 2022-12-06 DIAGNOSIS — D631 Anemia in chronic kidney disease: Secondary | ICD-10-CM | POA: Diagnosis present

## 2022-12-06 DIAGNOSIS — Z9181 History of falling: Secondary | ICD-10-CM | POA: Diagnosis not present

## 2022-12-06 DIAGNOSIS — K648 Other hemorrhoids: Secondary | ICD-10-CM | POA: Diagnosis present

## 2022-12-06 DIAGNOSIS — E875 Hyperkalemia: Secondary | ICD-10-CM | POA: Diagnosis present

## 2022-12-06 DIAGNOSIS — F32A Depression, unspecified: Secondary | ICD-10-CM | POA: Diagnosis present

## 2022-12-06 DIAGNOSIS — N281 Cyst of kidney, acquired: Secondary | ICD-10-CM | POA: Diagnosis not present

## 2022-12-06 DIAGNOSIS — Z79890 Hormone replacement therapy (postmenopausal): Secondary | ICD-10-CM | POA: Diagnosis not present

## 2022-12-06 DIAGNOSIS — M069 Rheumatoid arthritis, unspecified: Secondary | ICD-10-CM | POA: Diagnosis present

## 2022-12-06 DIAGNOSIS — I129 Hypertensive chronic kidney disease with stage 1 through stage 4 chronic kidney disease, or unspecified chronic kidney disease: Secondary | ICD-10-CM | POA: Diagnosis present

## 2022-12-06 DIAGNOSIS — I2510 Atherosclerotic heart disease of native coronary artery without angina pectoris: Secondary | ICD-10-CM | POA: Diagnosis present

## 2022-12-06 DIAGNOSIS — Z86711 Personal history of pulmonary embolism: Secondary | ICD-10-CM | POA: Diagnosis not present

## 2022-12-06 DIAGNOSIS — K5731 Diverticulosis of large intestine without perforation or abscess with bleeding: Secondary | ICD-10-CM | POA: Diagnosis present

## 2022-12-06 DIAGNOSIS — J431 Panlobular emphysema: Secondary | ICD-10-CM | POA: Diagnosis not present

## 2022-12-06 DIAGNOSIS — K922 Gastrointestinal hemorrhage, unspecified: Secondary | ICD-10-CM | POA: Diagnosis present

## 2022-12-06 DIAGNOSIS — Z7901 Long term (current) use of anticoagulants: Secondary | ICD-10-CM | POA: Diagnosis not present

## 2022-12-06 DIAGNOSIS — K5730 Diverticulosis of large intestine without perforation or abscess without bleeding: Secondary | ICD-10-CM | POA: Diagnosis not present

## 2022-12-06 DIAGNOSIS — E785 Hyperlipidemia, unspecified: Secondary | ICD-10-CM | POA: Diagnosis present

## 2022-12-06 DIAGNOSIS — Z86718 Personal history of other venous thrombosis and embolism: Secondary | ICD-10-CM | POA: Diagnosis not present

## 2022-12-06 DIAGNOSIS — K7689 Other specified diseases of liver: Secondary | ICD-10-CM | POA: Diagnosis not present

## 2022-12-06 DIAGNOSIS — Z96642 Presence of left artificial hip joint: Secondary | ICD-10-CM | POA: Diagnosis present

## 2022-12-06 DIAGNOSIS — E782 Mixed hyperlipidemia: Secondary | ICD-10-CM | POA: Diagnosis not present

## 2022-12-06 DIAGNOSIS — Z66 Do not resuscitate: Secondary | ICD-10-CM | POA: Diagnosis present

## 2022-12-06 DIAGNOSIS — K5791 Diverticulosis of intestine, part unspecified, without perforation or abscess with bleeding: Secondary | ICD-10-CM | POA: Diagnosis not present

## 2022-12-06 DIAGNOSIS — M6281 Muscle weakness (generalized): Secondary | ICD-10-CM | POA: Diagnosis not present

## 2022-12-06 DIAGNOSIS — J449 Chronic obstructive pulmonary disease, unspecified: Secondary | ICD-10-CM | POA: Diagnosis present

## 2022-12-06 DIAGNOSIS — R2681 Unsteadiness on feet: Secondary | ICD-10-CM | POA: Diagnosis not present

## 2022-12-06 DIAGNOSIS — I10 Essential (primary) hypertension: Secondary | ICD-10-CM | POA: Diagnosis not present

## 2022-12-06 DIAGNOSIS — K449 Diaphragmatic hernia without obstruction or gangrene: Secondary | ICD-10-CM | POA: Diagnosis not present

## 2022-12-06 DIAGNOSIS — I160 Hypertensive urgency: Secondary | ICD-10-CM | POA: Diagnosis present

## 2022-12-06 DIAGNOSIS — R278 Other lack of coordination: Secondary | ICD-10-CM | POA: Diagnosis not present

## 2022-12-06 DIAGNOSIS — Z951 Presence of aortocoronary bypass graft: Secondary | ICD-10-CM | POA: Diagnosis not present

## 2022-12-06 DIAGNOSIS — K921 Melena: Secondary | ICD-10-CM | POA: Diagnosis not present

## 2022-12-06 DIAGNOSIS — N184 Chronic kidney disease, stage 4 (severe): Secondary | ICD-10-CM

## 2022-12-06 DIAGNOSIS — Z87891 Personal history of nicotine dependence: Secondary | ICD-10-CM | POA: Diagnosis not present

## 2022-12-06 DIAGNOSIS — E039 Hypothyroidism, unspecified: Secondary | ICD-10-CM | POA: Diagnosis present

## 2022-12-06 DIAGNOSIS — Z79899 Other long term (current) drug therapy: Secondary | ICD-10-CM | POA: Diagnosis not present

## 2022-12-06 DIAGNOSIS — E872 Acidosis, unspecified: Secondary | ICD-10-CM | POA: Diagnosis present

## 2022-12-06 DIAGNOSIS — K219 Gastro-esophageal reflux disease without esophagitis: Secondary | ICD-10-CM | POA: Diagnosis not present

## 2022-12-06 DIAGNOSIS — N186 End stage renal disease: Secondary | ICD-10-CM | POA: Diagnosis not present

## 2022-12-06 LAB — IRON AND TIBC
Iron: 72 ug/dL (ref 28–170)
Saturation Ratios: 28 % (ref 10.4–31.8)
TIBC: 260 ug/dL (ref 250–450)
UIBC: 188 ug/dL

## 2022-12-06 LAB — POCT HEMOGLOBIN-HEMACUE: Hemoglobin: 8.8 g/dL — ABNORMAL LOW (ref 12.0–15.0)

## 2022-12-06 LAB — FERRITIN: Ferritin: 453 ng/mL — ABNORMAL HIGH (ref 11–307)

## 2022-12-06 MED ORDER — EPOETIN ALFA-EPBX 10000 UNIT/ML IJ SOLN
INTRAMUSCULAR | Status: AC
  Administered 2022-12-06: 20000 [IU] via SUBCUTANEOUS
  Filled 2022-12-06: qty 2

## 2022-12-06 MED ORDER — EPOETIN ALFA-EPBX 10000 UNIT/ML IJ SOLN: 20000.0000 [IU] | INTRAMUSCULAR | Status: DC

## 2022-12-07 ENCOUNTER — Encounter (HOSPITAL_COMMUNITY): Payer: Self-pay

## 2022-12-07 ENCOUNTER — Inpatient Hospital Stay (HOSPITAL_COMMUNITY)
Admission: EM | Admit: 2022-12-07 | Discharge: 2022-12-10 | DRG: 393 | Disposition: A | Discharge status: Home / Self Care | Payer: Medicare PPO | Attending: Internal Medicine | Admitting: Internal Medicine

## 2022-12-07 DIAGNOSIS — Z86718 Personal history of other venous thrombosis and embolism: Secondary | ICD-10-CM

## 2022-12-07 DIAGNOSIS — Z8249 Family history of ischemic heart disease and other diseases of the circulatory system: Secondary | ICD-10-CM

## 2022-12-07 DIAGNOSIS — Z8679 Personal history of other diseases of the circulatory system: Secondary | ICD-10-CM

## 2022-12-07 DIAGNOSIS — J449 Chronic obstructive pulmonary disease, unspecified: Secondary | ICD-10-CM | POA: Diagnosis present

## 2022-12-07 DIAGNOSIS — K5731 Diverticulosis of large intestine without perforation or abscess with bleeding: Secondary | ICD-10-CM | POA: Diagnosis present

## 2022-12-07 DIAGNOSIS — I160 Hypertensive urgency: Secondary | ICD-10-CM | POA: Diagnosis present

## 2022-12-07 DIAGNOSIS — F419 Anxiety disorder, unspecified: Secondary | ICD-10-CM | POA: Diagnosis present

## 2022-12-07 DIAGNOSIS — E039 Hypothyroidism, unspecified: Secondary | ICD-10-CM | POA: Diagnosis present

## 2022-12-07 DIAGNOSIS — Z9181 History of falling: Secondary | ICD-10-CM | POA: Diagnosis not present

## 2022-12-07 DIAGNOSIS — E875 Hyperkalemia: Secondary | ICD-10-CM | POA: Diagnosis present

## 2022-12-07 DIAGNOSIS — Z885 Allergy status to narcotic agent status: Secondary | ICD-10-CM

## 2022-12-07 DIAGNOSIS — Z86711 Personal history of pulmonary embolism: Secondary | ICD-10-CM

## 2022-12-07 DIAGNOSIS — Z9081 Acquired absence of spleen: Secondary | ICD-10-CM

## 2022-12-07 DIAGNOSIS — Z9841 Cataract extraction status, right eye: Secondary | ICD-10-CM

## 2022-12-07 DIAGNOSIS — I252 Old myocardial infarction: Secondary | ICD-10-CM

## 2022-12-07 DIAGNOSIS — R059 Cough, unspecified: Secondary | ICD-10-CM | POA: Diagnosis present

## 2022-12-07 DIAGNOSIS — Z8711 Personal history of peptic ulcer disease: Secondary | ICD-10-CM

## 2022-12-07 DIAGNOSIS — K644 Residual hemorrhoidal skin tags: Secondary | ICD-10-CM | POA: Diagnosis present

## 2022-12-07 DIAGNOSIS — Z833 Family history of diabetes mellitus: Secondary | ICD-10-CM

## 2022-12-07 DIAGNOSIS — Z9842 Cataract extraction status, left eye: Secondary | ICD-10-CM

## 2022-12-07 DIAGNOSIS — K219 Gastro-esophageal reflux disease without esophagitis: Secondary | ICD-10-CM | POA: Diagnosis present

## 2022-12-07 DIAGNOSIS — K649 Unspecified hemorrhoids: Secondary | ICD-10-CM

## 2022-12-07 DIAGNOSIS — K449 Diaphragmatic hernia without obstruction or gangrene: Secondary | ICD-10-CM | POA: Diagnosis not present

## 2022-12-07 DIAGNOSIS — N184 Chronic kidney disease, stage 4 (severe): Secondary | ICD-10-CM | POA: Diagnosis present

## 2022-12-07 DIAGNOSIS — Z886 Allergy status to analgesic agent status: Secondary | ICD-10-CM

## 2022-12-07 DIAGNOSIS — K5730 Diverticulosis of large intestine without perforation or abscess without bleeding: Secondary | ICD-10-CM | POA: Diagnosis not present

## 2022-12-07 DIAGNOSIS — K7689 Other specified diseases of liver: Secondary | ICD-10-CM | POA: Diagnosis not present

## 2022-12-07 DIAGNOSIS — Z87891 Personal history of nicotine dependence: Secondary | ICD-10-CM

## 2022-12-07 DIAGNOSIS — Z79899 Other long term (current) drug therapy: Secondary | ICD-10-CM

## 2022-12-07 DIAGNOSIS — Z66 Do not resuscitate: Secondary | ICD-10-CM | POA: Diagnosis present

## 2022-12-07 DIAGNOSIS — Z79890 Hormone replacement therapy (postmenopausal): Secondary | ICD-10-CM

## 2022-12-07 DIAGNOSIS — Z951 Presence of aortocoronary bypass graft: Secondary | ICD-10-CM

## 2022-12-07 DIAGNOSIS — K922 Gastrointestinal hemorrhage, unspecified: Secondary | ICD-10-CM | POA: Diagnosis not present

## 2022-12-07 DIAGNOSIS — Z83438 Family history of other disorder of lipoprotein metabolism and other lipidemia: Secondary | ICD-10-CM

## 2022-12-07 DIAGNOSIS — E785 Hyperlipidemia, unspecified: Secondary | ICD-10-CM | POA: Diagnosis present

## 2022-12-07 DIAGNOSIS — I10 Essential (primary) hypertension: Secondary | ICD-10-CM | POA: Diagnosis present

## 2022-12-07 DIAGNOSIS — D72829 Elevated white blood cell count, unspecified: Secondary | ICD-10-CM | POA: Diagnosis present

## 2022-12-07 DIAGNOSIS — F32A Depression, unspecified: Secondary | ICD-10-CM | POA: Diagnosis present

## 2022-12-07 DIAGNOSIS — Z86010 Personal history of colonic polyps: Secondary | ICD-10-CM

## 2022-12-07 DIAGNOSIS — I2510 Atherosclerotic heart disease of native coronary artery without angina pectoris: Secondary | ICD-10-CM | POA: Diagnosis present

## 2022-12-07 DIAGNOSIS — R944 Abnormal results of kidney function studies: Secondary | ICD-10-CM | POA: Diagnosis present

## 2022-12-07 DIAGNOSIS — Z8673 Personal history of transient ischemic attack (TIA), and cerebral infarction without residual deficits: Secondary | ICD-10-CM

## 2022-12-07 DIAGNOSIS — M353 Polymyalgia rheumatica: Secondary | ICD-10-CM | POA: Diagnosis present

## 2022-12-07 DIAGNOSIS — R278 Other lack of coordination: Secondary | ICD-10-CM | POA: Diagnosis not present

## 2022-12-07 DIAGNOSIS — K648 Other hemorrhoids: Principal | ICD-10-CM | POA: Diagnosis present

## 2022-12-07 DIAGNOSIS — E872 Acidosis, unspecified: Secondary | ICD-10-CM | POA: Diagnosis present

## 2022-12-07 DIAGNOSIS — Z823 Family history of stroke: Secondary | ICD-10-CM

## 2022-12-07 DIAGNOSIS — M6281 Muscle weakness (generalized): Secondary | ICD-10-CM | POA: Diagnosis not present

## 2022-12-07 DIAGNOSIS — Z96642 Presence of left artificial hip joint: Secondary | ICD-10-CM | POA: Diagnosis present

## 2022-12-07 DIAGNOSIS — Z7901 Long term (current) use of anticoagulants: Secondary | ICD-10-CM

## 2022-12-07 DIAGNOSIS — M069 Rheumatoid arthritis, unspecified: Secondary | ICD-10-CM | POA: Diagnosis present

## 2022-12-07 DIAGNOSIS — D631 Anemia in chronic kidney disease: Secondary | ICD-10-CM | POA: Diagnosis present

## 2022-12-07 DIAGNOSIS — I129 Hypertensive chronic kidney disease with stage 1 through stage 4 chronic kidney disease, or unspecified chronic kidney disease: Secondary | ICD-10-CM | POA: Diagnosis present

## 2022-12-07 DIAGNOSIS — Z888 Allergy status to other drugs, medicaments and biological substances status: Secondary | ICD-10-CM

## 2022-12-07 LAB — COMPREHENSIVE METABOLIC PANEL
ALT: 17 U/L (ref 0–44)
AST: 29 U/L (ref 15–41)
Albumin: 3.2 g/dL — ABNORMAL LOW (ref 3.5–5.0)
Alkaline Phosphatase: 85 U/L (ref 38–126)
Anion gap: 17 — ABNORMAL HIGH (ref 5–15)
BUN: 79 mg/dL — ABNORMAL HIGH (ref 8–23)
CO2: 15 mmol/L — ABNORMAL LOW (ref 22–32)
Calcium: 9.1 mg/dL (ref 8.9–10.3)
Chloride: 104 mmol/L (ref 98–111)
Creatinine, Ser: 3.68 mg/dL — ABNORMAL HIGH (ref 0.44–1.00)
GFR, Estimated: 12 mL/min — ABNORMAL LOW (ref >60)
Glucose, Bld: 141 mg/dL — ABNORMAL HIGH (ref 70–99)
Potassium: 5.2 mmol/L — ABNORMAL HIGH (ref 3.5–5.1)
Sodium: 136 mmol/L (ref 135–145)
Total Bilirubin: 1.2 mg/dL (ref 0.3–1.2)
Total Protein: 6.1 g/dL — ABNORMAL LOW (ref 6.5–8.1)

## 2022-12-07 LAB — TYPE AND SCREEN
ABO/RH(D): O POS
Antibody Screen: NEGATIVE

## 2022-12-07 LAB — CBC
HCT: 27.4 % — ABNORMAL LOW (ref 36.0–46.0)
Hemoglobin: 8.5 g/dL — ABNORMAL LOW (ref 12.0–15.0)
MCH: 30.8 pg (ref 26.0–34.0)
MCHC: 31 g/dL (ref 30.0–36.0)
MCV: 99.3 fL (ref 80.0–100.0)
Platelets: 326 10*3/uL (ref 150–400)
RBC: 2.76 MIL/uL — ABNORMAL LOW (ref 3.87–5.11)
RDW: 16.7 % — ABNORMAL HIGH (ref 11.5–15.5)
WBC: 9.3 10*3/uL (ref 4.0–10.5)
nRBC: 0.3 % — ABNORMAL HIGH (ref 0.0–0.2)

## 2022-12-07 LAB — POC OCCULT BLOOD, ED: Fecal Occult Bld: NEGATIVE

## 2022-12-07 MED ORDER — SODIUM ZIRCONIUM CYCLOSILICATE 5 G PO PACK
5.0000 g | PACK | Freq: Once | ORAL | Status: AC
  Administered 2022-12-08: 5 g via ORAL
  Filled 2022-12-07: qty 1

## 2022-12-07 NOTE — ED Triage Notes (Signed)
Pt is coming in for rectal bleeding that started today, she was D/c'd 1 week ago from being admitted for the same, did not need a tranfusion at that time per her words. Currently only had two episodes today of rectal bleeding but describes it as bright red. She also mentions some feelings of being washed out but admits to chronically low hgb. No nausea, no chest pain, no other symptoms present at this time

## 2022-12-07 NOTE — ED Provider Notes (Signed)
[place] EMERGENCY DEPARTMENT AT [facility] Provider Note   CSN: [id number] Arrival date & time: 12/07/22  2127     History  Chief Complaint  Patient presents with   Rectal Bleeding    [name] is a 82 y.o. female with a past medical history of rheumatoid arthritis, hypertension, hypothyroidism, CAD status post CABG on Eliquis, CKD stage IV, PUD, diverticulosis presents to the ER for evaluation of rectal bleeding.  Patient reports 2 episodes of bright red blood in her stool this morning and this afternoon.  Patient was admitted for similar symptoms a week ago.  She denies fever, nausea, vomiting, abdominal pain, rectal pain, constipation or diarrhea.  Patient states that she had started back on Eliquis but did not take that today.  She underwent EGD and colonoscopy on 11/28/2022.  EGD showed some mild gastritis.  Colonoscopy with 3 small polyps that were removed.  Her bleeding is stopped to be likely due to a diverticular bleed.  She was sent home with Metamucil.  She was also seen by GI during admission, got a  tagged RBC-scan which was negative for source of GI bleed on 7/29.   Rectal Bleeding      Home Medications Prior to Admission medications   Medication Sig Start Date End Date Taking? Authorizing Provider  acetaminophen (TYLENOL) 325 MG tablet Take 2 tablets (650 mg total) by mouth 2 (two) times daily as needed (generalized pain). 08/24/22   Mast, Man X, NP  albuterol (VENTOLIN HFA) 108 (90 Base) MCG/ACT inhaler Inhale 1-2 puffs into the lungs every 4 (four) hours as needed for wheezing or shortness of breath. 03/27/22   Mast, Man X, NP  apixaban (ELIQUIS) 2.5 MG TABS tablet Take 1 tablet (2.5 mg total) by mouth 2 (two) times daily. 08/24/22   Mast, Man X, NP  ascorbic acid (VITAMIN C) 500 MG tablet Take 1 tablet (500 mg total) by mouth daily. 03/27/22   Mast, Man X, NP  atorvastatin (LIPITOR) 80 MG tablet Take 1 tablet (80 mg total) by mouth daily. 08/24/22   Mast,  Man X, NP  carvedilol (COREG) 6.25 MG tablet Take 6.25 mg by mouth 2 (two) times daily with a meal. Patient not taking: Reported on 11/27/2022    [provider]  Cholecalciferol (VITAMIN D3) 50 MCG (2000 UT) capsule Take 1 capsule (2,000 Units total) by mouth daily. 03/27/22   Mast, Man X, NP  cloNIDine (CATAPRES - DOSED IN MG/24 HR) 0.1 mg/24hr patch Place 1 patch (0.1 mg total) onto the skin once a week. Patient taking differently: Place 0.2 mg onto the skin once a week. 10/19/22   Plotnikov, Georgina Quint, MD  cloNIDine (CATAPRES) 0.2 MG tablet Take 1 tablet (0.2 mg total) by mouth 4 (four) times daily as needed for up to 30 doses. For systolic blood pressure (top number) over 200 mmhg 10/19/22   Plotnikov, Georgina Quint, MD  clotrimazole-betamethasone (LOTRISONE) cream Apply 1 Application topically 2 (two) times daily. Mouth corner rash Patient not taking: Reported on 11/27/2022 09/05/22 09/05/23  Plotnikov, Georgina Quint, MD  Cyanocobalamin 2500 MCG TABS Take 2,500 mcg by mouth daily. 08/24/22   Mast, Man X, NP  docusate sodium (COLACE) 100 MG capsule Take 1-2 capsules (100-200 mg total) by mouth daily as needed for mild constipation. Patient not taking: Reported on 11/27/2022 08/24/22   Mast, Man X, NP  Fexofenadine HCl (ALLERGY 24-HR PO) Take 1 tablet by mouth in the morning and at bedtime.  [provider]  Golimumab 50 MG/0.5ML SOAJ Take 50 mg by mouth every 2 (two) months. Hold for the next 2 weeks per Orthopedic surgery recommendations. Patient not taking: Reported on 11/27/2022 03/27/22   Mast, Man X, NP  leflunomide (ARAVA) 20 MG tablet Take 1 tablet (20 mg total) by mouth daily. Patient taking differently: Take 10 mg by mouth daily. 08/24/22   Mast, Man X, NP  levothyroxine (SYNTHROID) 88 MCG tablet Take 1 tablet (88 mcg total) by mouth daily before breakfast. 08/24/22   Mast, Man X, NP  losartan (COZAAR) 50 MG tablet Take 1 tablet (50 mg total) by mouth daily. Keep Aug appt for future  refills 11/24/22   Plotnikov, Georgina Quint, MD  Multiple Vitamins-Minerals (CENTRUM ADULT PO) Take 1 tablet by mouth daily. 1 Tablet Daily.    [provider]  pantoprazole (PROTONIX) 40 MG tablet Take 1 tablet (40 mg total) by mouth daily. 08/24/22   Mast, Man X, NP  polyethylene glycol (MIRALAX / GLYCOLAX) 17 g packet Take 17 g by mouth daily as needed. Patient not taking: Reported on 11/27/2022 08/24/22   Mast, Man X, NP  sertraline (ZOLOFT) 50 MG tablet Take 1 tablet (50 mg total) by mouth daily. Patient taking differently: Take 25 mg by mouth daily. 10/19/22   Plotnikov, Georgina Quint, MD  triamcinolone (NASACORT) 55 MCG/ACT AERO nasal inhaler Place 1 spray into the nose daily. 08/24/22   Mast, Man X, NP      Allergies    Nsaids, Aspirin, Codeine, and Nitrostat [nitroglycerin]    Review of Systems   Review of Systems  Gastrointestinal:  Positive for hematochezia.    Physical Exam Updated Vital Signs BP (!) 142/74 (BP Location: Right Arm)   Pulse 93   Temp 98.2 F (36.8 C) (Oral)   Resp 18   SpO2 92%  Physical Exam Vitals and nursing note reviewed.  Constitutional:      Appearance: Normal appearance.  HENT:     Head: Normocephalic and atraumatic.     Mouth/Throat:     Mouth: Mucous membranes are moist.  Eyes:     General: No scleral icterus. Cardiovascular:     Rate and Rhythm: Normal rate and regular rhythm.     Pulses: Normal pulses.     Heart sounds: Normal heart sounds.  Pulmonary:     Effort: Pulmonary effort is normal.     Breath sounds: Normal breath sounds.  Abdominal:     General: Abdomen is flat.     Palpations: Abdomen is soft.     Tenderness: There is no abdominal tenderness.  Musculoskeletal:        General: No deformity.  Skin:    General: Skin is warm.     Coloration: Skin is pale.     Findings: No rash.  Neurological:     General: No focal deficit present.     Mental Status: She is alert.  Psychiatric:        Mood and Affect: Mood normal.      ED Results / Procedures / Treatments   Labs (all labs ordered are listed, but only abnormal results are displayed) Labs Reviewed  CBC - Abnormal; Notable for the following components:      Result Value   RBC 2.76 (*)    Hemoglobin 8.5 (*)    HCT 27.4 (*)    RDW 16.7 (*)    nRBC 0.3 (*)    All other components within normal limits  COMPREHENSIVE METABOLIC  PANEL  POC OCCULT BLOOD, ED  TYPE AND SCREEN    EKG None  Radiology No results found.  Procedures Procedures    Medications Ordered in ED Medications - No data to display  ED Course/ Medical Decision Making/ A&P                                 Medical Decision Making Amount and/or Complexity of Data Reviewed Labs: ordered. Radiology: ordered.  Risk Prescription drug management.   This patient presents to the ED for GI bleed, this involves an extensive number of treatment options, and is a complaint that carries with a high risk of complications and morbidity.  The differential diagnosis includes upper GI bleed, lower GI bleed, diverticulosis, diverticulitis, appendicitis, SBO, C. difficile, gastritis, colitis, infectious etiology.  This is not an exhaustive list.  Lab tests: I ordered and personally interpreted labs.  The pertinent results include: WBC unremarkable. Hbg unremarkable. Platelets unremarkable. Electrolytes significant for potassium 5.2. BUN 79, creatinine 3.68.   Imaging studies: I ordered imaging studies, personally reviewed, interpreted imaging and agree with the radiologist's interpretations. The results include: CT angio GI bleed showed no active GI bleeding.  Extensive diverticulosis with no diverticulitis.  Problem list/ ED course/ Critical interventions/ Medical management: HPI: See above Vital signs significant for systolic blood pressure above 409, otherwise normal range and stable throughout visit. Laboratory/imaging studies significant for: See above. On physical examination,  patient is afebrile and appears in no acute distress.  Negative peritoneal sign.  Patient has no abdominal tenderness, nausea or vomiting.  Rectal exam done with paramedics present throughout the examination.  External hemorrhoid noted with no active bleeding.  CT angio bleed showed no active GI bleeding.  It did show extensive diverticulosis with no diverticulitis.  Patient's blood pressure is up above 200s.  Stated she missed her hypertensive medication last night.  Will give an her dose of carvedilol 3.125 mg here.  CBC with hemoglobin of 8.5 (8.9 a week ago).  CMP is significant for potassium of 5.2.  Given a dose of Lokelma.  I think patient will benefit from admission due to recurrent GI bleed.  GI team consulted and will follow up with patient inpatient. I have reviewed the patient home medicines and have made adjustments as needed.  Cardiac monitoring/EKG: The patient was maintained on a cardiac monitor.  I personally reviewed and interpreted the cardiac monitor which showed an underlying rhythm of: sinus rhythm.  Additional history obtained: External records from outside source obtained and reviewed including: Chart review including previous notes, labs, imaging.  Consultations obtained: I spoke to Dr. Milbert Coulter GI who recommended repeat CBC.  GI will see patient in consultation. I spoke to Dr. Olevia Bowens [facility].  He agreed to admit the patient.  Disposition Admit.  This chart was dictated using voice recognition software.  Despite best efforts to proofread,  errors can occur which can change the documentation meaning.          Final Clinical Impression(s) / ED Diagnoses Final diagnoses:  Lower GI bleed    Rx / DC Orders ED Discharge Orders     None         Jeanelle Malling, [place] 12/08/22 0454    Rondel Baton, MD 12/08/22 1048

## 2022-12-08 ENCOUNTER — Emergency Department (HOSPITAL_COMMUNITY): Payer: Medicare PPO

## 2022-12-08 ENCOUNTER — Other Ambulatory Visit: Payer: Self-pay

## 2022-12-08 DIAGNOSIS — M069 Rheumatoid arthritis, unspecified: Secondary | ICD-10-CM | POA: Diagnosis present

## 2022-12-08 DIAGNOSIS — R059 Cough, unspecified: Secondary | ICD-10-CM | POA: Diagnosis present

## 2022-12-08 DIAGNOSIS — I160 Hypertensive urgency: Secondary | ICD-10-CM | POA: Diagnosis present

## 2022-12-08 DIAGNOSIS — Z79890 Hormone replacement therapy (postmenopausal): Secondary | ICD-10-CM | POA: Diagnosis not present

## 2022-12-08 DIAGNOSIS — K219 Gastro-esophageal reflux disease without esophagitis: Secondary | ICD-10-CM | POA: Diagnosis not present

## 2022-12-08 DIAGNOSIS — Z7901 Long term (current) use of anticoagulants: Secondary | ICD-10-CM

## 2022-12-08 DIAGNOSIS — I129 Hypertensive chronic kidney disease with stage 1 through stage 4 chronic kidney disease, or unspecified chronic kidney disease: Secondary | ICD-10-CM | POA: Diagnosis present

## 2022-12-08 DIAGNOSIS — I2510 Atherosclerotic heart disease of native coronary artery without angina pectoris: Secondary | ICD-10-CM | POA: Diagnosis present

## 2022-12-08 DIAGNOSIS — I10 Essential (primary) hypertension: Secondary | ICD-10-CM | POA: Diagnosis not present

## 2022-12-08 DIAGNOSIS — Z951 Presence of aortocoronary bypass graft: Secondary | ICD-10-CM

## 2022-12-08 DIAGNOSIS — N186 End stage renal disease: Secondary | ICD-10-CM

## 2022-12-08 DIAGNOSIS — K922 Gastrointestinal hemorrhage, unspecified: Secondary | ICD-10-CM | POA: Diagnosis present

## 2022-12-08 DIAGNOSIS — J431 Panlobular emphysema: Secondary | ICD-10-CM

## 2022-12-08 DIAGNOSIS — K648 Other hemorrhoids: Secondary | ICD-10-CM

## 2022-12-08 DIAGNOSIS — N184 Chronic kidney disease, stage 4 (severe): Secondary | ICD-10-CM | POA: Diagnosis present

## 2022-12-08 DIAGNOSIS — Z87891 Personal history of nicotine dependence: Secondary | ICD-10-CM | POA: Diagnosis not present

## 2022-12-08 DIAGNOSIS — Z79899 Other long term (current) drug therapy: Secondary | ICD-10-CM | POA: Diagnosis not present

## 2022-12-08 DIAGNOSIS — K5791 Diverticulosis of intestine, part unspecified, without perforation or abscess with bleeding: Secondary | ICD-10-CM

## 2022-12-08 DIAGNOSIS — K5731 Diverticulosis of large intestine without perforation or abscess with bleeding: Secondary | ICD-10-CM | POA: Diagnosis present

## 2022-12-08 DIAGNOSIS — E039 Hypothyroidism, unspecified: Secondary | ICD-10-CM

## 2022-12-08 DIAGNOSIS — M353 Polymyalgia rheumatica: Secondary | ICD-10-CM | POA: Diagnosis present

## 2022-12-08 DIAGNOSIS — D631 Anemia in chronic kidney disease: Secondary | ICD-10-CM | POA: Diagnosis present

## 2022-12-08 DIAGNOSIS — J449 Chronic obstructive pulmonary disease, unspecified: Secondary | ICD-10-CM | POA: Diagnosis present

## 2022-12-08 DIAGNOSIS — E875 Hyperkalemia: Secondary | ICD-10-CM | POA: Diagnosis present

## 2022-12-08 DIAGNOSIS — Z66 Do not resuscitate: Secondary | ICD-10-CM | POA: Diagnosis present

## 2022-12-08 DIAGNOSIS — K649 Unspecified hemorrhoids: Secondary | ICD-10-CM

## 2022-12-08 DIAGNOSIS — Z86718 Personal history of other venous thrombosis and embolism: Secondary | ICD-10-CM | POA: Diagnosis not present

## 2022-12-08 DIAGNOSIS — K625 Hemorrhage of anus and rectum: Secondary | ICD-10-CM | POA: Diagnosis not present

## 2022-12-08 DIAGNOSIS — Z96642 Presence of left artificial hip joint: Secondary | ICD-10-CM | POA: Diagnosis present

## 2022-12-08 DIAGNOSIS — K921 Melena: Secondary | ICD-10-CM | POA: Diagnosis not present

## 2022-12-08 DIAGNOSIS — E872 Acidosis, unspecified: Secondary | ICD-10-CM | POA: Diagnosis present

## 2022-12-08 DIAGNOSIS — E782 Mixed hyperlipidemia: Secondary | ICD-10-CM

## 2022-12-08 DIAGNOSIS — Z86711 Personal history of pulmonary embolism: Secondary | ICD-10-CM | POA: Diagnosis not present

## 2022-12-08 DIAGNOSIS — F32A Depression, unspecified: Secondary | ICD-10-CM | POA: Diagnosis present

## 2022-12-08 DIAGNOSIS — E785 Hyperlipidemia, unspecified: Secondary | ICD-10-CM | POA: Diagnosis present

## 2022-12-08 LAB — CBC
HCT: 24.3 % — ABNORMAL LOW (ref 36.0–46.0)
HCT: 25.9 % — ABNORMAL LOW (ref 36.0–46.0)
HCT: 28.9 % — ABNORMAL LOW (ref 36.0–46.0)
Hemoglobin: 7.8 g/dL — ABNORMAL LOW (ref 12.0–15.0)
Hemoglobin: 8 g/dL — ABNORMAL LOW (ref 12.0–15.0)
Hemoglobin: 9 g/dL — ABNORMAL LOW (ref 12.0–15.0)
MCH: 31.3 pg (ref 26.0–34.0)
MCH: 31.1 pg (ref 26.0–34.0)
MCH: 30.7 pg (ref 26.0–34.0)
MCHC: 32.1 g/dL (ref 30.0–36.0)
MCHC: 30.9 g/dL (ref 30.0–36.0)
MCHC: 31.1 g/dL (ref 30.0–36.0)
MCV: 97.6 fL (ref 80.0–100.0)
MCV: 100.8 fL — ABNORMAL HIGH (ref 80.0–100.0)
MCV: 98.6 fL (ref 80.0–100.0)
Platelets: 320 10*3/uL (ref 150–400)
Platelets: 298 10*3/uL (ref 150–400)
Platelets: 344 10*3/uL (ref 150–400)
RBC: 2.49 MIL/uL — ABNORMAL LOW (ref 3.87–5.11)
RBC: 2.57 MIL/uL — ABNORMAL LOW (ref 3.87–5.11)
RBC: 2.93 MIL/uL — ABNORMAL LOW (ref 3.87–5.11)
RDW: 16.9 % — ABNORMAL HIGH (ref 11.5–15.5)
RDW: 17.3 % — ABNORMAL HIGH (ref 11.5–15.5)
RDW: 16.8 % — ABNORMAL HIGH (ref 11.5–15.5)
WBC: 11.9 10*3/uL — ABNORMAL HIGH (ref 4.0–10.5)
WBC: 11.2 10*3/uL — ABNORMAL HIGH (ref 4.0–10.5)
WBC: 11.1 10*3/uL — ABNORMAL HIGH (ref 4.0–10.5)
nRBC: 0.3 % — ABNORMAL HIGH (ref 0.0–0.2)
nRBC: 0.4 % — ABNORMAL HIGH (ref 0.0–0.2)
nRBC: 0.5 % — ABNORMAL HIGH (ref 0.0–0.2)

## 2022-12-08 LAB — BASIC METABOLIC PANEL
Anion gap: 10 (ref 5–15)
BUN: 69 mg/dL — ABNORMAL HIGH (ref 8–23)
CO2: 22 mmol/L (ref 22–32)
Calcium: 9.2 mg/dL (ref 8.9–10.3)
Chloride: 104 mmol/L (ref 98–111)
Creatinine, Ser: 3.66 mg/dL — ABNORMAL HIGH (ref 0.44–1.00)
GFR, Estimated: 12 mL/min — ABNORMAL LOW (ref >60)
Glucose, Bld: 110 mg/dL — ABNORMAL HIGH (ref 70–99)
Potassium: 4.1 mmol/L (ref 3.5–5.1)
Sodium: 136 mmol/L (ref 135–145)

## 2022-12-08 MED ORDER — HYDRALAZINE HCL 20 MG/ML IJ SOLN
10.0000 mg | INTRAMUSCULAR | Status: DC | PRN
  Administered 2022-12-08 – 2022-12-10 (×4): 10 mg via INTRAVENOUS
  Filled 2022-12-08 (×4): qty 1

## 2022-12-08 MED ORDER — ACETAMINOPHEN 650 MG RE SUPP: 650.0000 mg | Freq: Four times a day (QID) | RECTAL | Status: DC | PRN

## 2022-12-08 MED ORDER — ATORVASTATIN CALCIUM 80 MG PO TABS
80.0000 mg | ORAL_TABLET | Freq: Every day | ORAL | Status: DC
  Administered 2022-12-08 – 2022-12-10 (×3): 80 mg via ORAL
  Filled 2022-12-08: qty 1
  Filled 2022-12-08: qty 2
  Filled 2022-12-08: qty 1

## 2022-12-08 MED ORDER — SODIUM CHLORIDE 0.9 % IV SOLN: INTRAVENOUS | Status: AC

## 2022-12-08 MED ORDER — PSYLLIUM 95 % PO PACK
1.0000 | PACK | Freq: Every day | ORAL | Status: DC
  Filled 2022-12-08 (×3): qty 1

## 2022-12-08 MED ORDER — ONDANSETRON HCL 4 MG/2ML IJ SOLN: 4.0000 mg | Freq: Four times a day (QID) | INTRAMUSCULAR | Status: DC | PRN

## 2022-12-08 MED ORDER — IOHEXOL 350 MG/ML SOLN
75.0000 mL | Freq: Once | INTRAVENOUS | Status: AC | PRN
  Administered 2022-12-08: 75 mL via INTRAVENOUS

## 2022-12-08 MED ORDER — CARVEDILOL 3.125 MG PO TABS
3.1250 mg | ORAL_TABLET | Freq: Two times a day (BID) | ORAL | Status: DC
  Administered 2022-12-08 (×2): 3.125 mg via ORAL
  Filled 2022-12-08: qty 1

## 2022-12-08 MED ORDER — ONDANSETRON HCL 4 MG PO TABS: 4.0000 mg | ORAL_TABLET | Freq: Four times a day (QID) | ORAL | Status: DC | PRN

## 2022-12-08 MED ORDER — LEVOTHYROXINE SODIUM 88 MCG PO TABS
88.0000 ug | ORAL_TABLET | Freq: Every day | ORAL | Status: DC
  Administered 2022-12-08 – 2022-12-10 (×3): 88 ug via ORAL
  Filled 2022-12-08 (×3): qty 1

## 2022-12-08 MED ORDER — LOSARTAN POTASSIUM 50 MG PO TABS: 50.0000 mg | ORAL_TABLET | Freq: Every day | ORAL | Status: DC

## 2022-12-08 MED ORDER — CLONIDINE HCL 0.2 MG/24HR TD PTWK
0.2000 mg | MEDICATED_PATCH | TRANSDERMAL | Status: DC
  Administered 2022-12-10: 0.2 mg via TRANSDERMAL
  Filled 2022-12-08: qty 1

## 2022-12-08 MED ORDER — AMLODIPINE BESYLATE 10 MG PO TABS
10.0000 mg | ORAL_TABLET | Freq: Every day | ORAL | Status: DC
  Administered 2022-12-08 – 2022-12-10 (×3): 10 mg via ORAL
  Filled 2022-12-08 (×2): qty 1
  Filled 2022-12-08: qty 2

## 2022-12-08 MED ORDER — CARVEDILOL 3.125 MG PO TABS
3.1250 mg | ORAL_TABLET | Freq: Once | ORAL | Status: AC
  Administered 2022-12-08: 3.125 mg via ORAL
  Filled 2022-12-08: qty 1

## 2022-12-08 MED ORDER — ALBUTEROL SULFATE (2.5 MG/3ML) 0.083% IN NEBU: 2.5000 mg | INHALATION_SOLUTION | RESPIRATORY_TRACT | Status: DC | PRN

## 2022-12-08 MED ORDER — CLONIDINE HCL 0.1 MG PO TABS
0.1000 mg | ORAL_TABLET | Freq: Four times a day (QID) | ORAL | Status: DC | PRN
  Administered 2022-12-08 (×3): 0.1 mg via ORAL
  Filled 2022-12-08 (×3): qty 1

## 2022-12-08 MED ORDER — LEFLUNOMIDE 10 MG PO TABS
10.0000 mg | ORAL_TABLET | Freq: Every day | ORAL | Status: DC
  Administered 2022-12-08 – 2022-12-10 (×3): 10 mg via ORAL
  Filled 2022-12-08 (×3): qty 1

## 2022-12-08 MED ORDER — HYDROCORTISONE ACETATE 25 MG RE SUPP
25.0000 mg | Freq: Two times a day (BID) | RECTAL | Status: DC
  Administered 2022-12-08 – 2022-12-10 (×5): 25 mg via RECTAL
  Filled 2022-12-08 (×7): qty 1

## 2022-12-08 MED ORDER — PANTOPRAZOLE SODIUM 40 MG PO TBEC
40.0000 mg | DELAYED_RELEASE_TABLET | Freq: Every day | ORAL | Status: DC
  Administered 2022-12-08 – 2022-12-10 (×3): 40 mg via ORAL
  Filled 2022-12-08 (×3): qty 1

## 2022-12-08 MED ORDER — CARVEDILOL 6.25 MG PO TABS
6.2500 mg | ORAL_TABLET | Freq: Two times a day (BID) | ORAL | Status: DC
  Administered 2022-12-09 – 2022-12-10 (×3): 6.25 mg via ORAL
  Filled 2022-12-08 (×4): qty 1

## 2022-12-08 MED ORDER — SERTRALINE HCL 50 MG PO TABS
25.0000 mg | ORAL_TABLET | Freq: Every day | ORAL | Status: DC
  Administered 2022-12-08 – 2022-12-10 (×3): 25 mg via ORAL
  Filled 2022-12-08 (×3): qty 1

## 2022-12-08 MED ORDER — ACETAMINOPHEN 325 MG PO TABS: 650.0000 mg | ORAL_TABLET | Freq: Four times a day (QID) | ORAL | Status: DC | PRN

## 2022-12-08 NOTE — ED Notes (Addendum)
Assisted GI MD in recal exam

## 2022-12-08 NOTE — ED Notes (Signed)
Pt unable to make it to the bathroom in time and had an episode of incontinence. Pt has bright red blood in stool .

## 2022-12-08 NOTE — ED Notes (Signed)
Patient assisted to RR, using walker well

## 2022-12-08 NOTE — Progress Notes (Signed)
PROGRESS NOTE    [name]  ZOX:[id number] DOB: 26-Dec-1940 DOA: 12/07/2022 PCP: [name], MD   Chief Complaint  Patient presents with   Rectal Bleeding    Brief Narrative:   This is a no charge note as patient was seen and admitted earlier today with Dr. Olevia Bowens, patient was seen and examined, chart, imaging, and labs were reviewed.   [name] is a 82 y.o. 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  Patient was hospitalized for similar episodes from 7/28 to 7/31 at [facility], we she underwent EGD and colonoscopy on 11/28/22. EGD showed mild gastritis, salmon colored mucosa in esophagus that was biopsied. prior gastric ulcers that have healed. Colonoscopy showed 3 small polyps that were removed, diverticulosis, hemorrhoids. It was suspected that her bleeding is likely due to a diverticular bleed.  On 7/29 patient had as well tagged RBC scan that was negative for source of GI bleeding. -Patient was discharged back on Eliquis, she presents with bright red blood per rectum, CT angio GI bleed, showed no evidence of active GI bleeding, extensive sigmoid diverticulosis, without evidence of diverticulitis.  Assessment & Plan:   Principal Problem:   GI bleed Active Problems:   Acquired hypothyroidism   Essential hypertension   Rheumatoid arthritis (HCC)   Hyperlipidemia   COPD (chronic obstructive pulmonary disease) (HCC)   CKD (chronic kidney disease) stage 4, GFR 15-29 ml/min (HCC) - baseline SCr 2.5. follows with Dr. Malen Gauze with [facility]   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; now atretic and significantly improved LAD lesion without intervention    GERD (gastroesophageal reflux disease)   Chronic anticoagulation   Lower GI bleed   Bleeding hemorrhoids  Bright red blood per rectum Extensive sigmoid diverticulosis - Concerning for lower GI bleed, likely diverticular in the setting of chronic anticoagulation with Eliquis. - CT angio of abdomen showed no acute bleeding, revealed extensive sigmoid diverticulosis. -Continue to hold Eliquis due to GI bleed -Management per GI, continue with supportive care, monitor -As well concern for hemorrhoidal bleed, start hemorrhoidal suppositories, could consider banding per GI.  Hx of DVT and PE  - she is on Eliquis for provoked PE diagnosed in October 2023 post hip replacement, it was just isolated subsegmental embolus in the medial right middle lobe pulmonary artery. -She is on anticoagulation for provoked small PE, Almost finished 1 year ON anticoagulation, I will check venous Dopplers, and if it is negative, then I will DC her Eliquis, as no further need for anticoagulation.  Peptic ulcer disease Continue pantoprazole   Chronic kidney disease stage IV Anion gap metabolic acidosis - Function essentially stable, with baseline creatinine ranging from 3.1-4.1 over the past several months. Continue closely monitor.  Add sodium bicarbonate supplementation -Continue with IV fluids, and monitor renal function closely given she received IV contrast with CT angio bleeding protocol   Coronary artery disease, status post CABG Reports no active chest pain.  Continue Coreg, Lipitor.   Hypertensive urgency Blood pressure markedly elevated in the ED. Continue current regimen of carvedilol, and clonidine patch. I have discontinued her losartan given potassium 5.2 on admission, and started her on amlodipine instead -She is on as needed clonidine, will add as needed  IV hydralazine as well  Hyperkalemia -Potassium 5.2 on admission, slightly hemolyzed, she received Lokelma, will hold losartan, repeat at 4.1    Chronic obstructive pulmonary disease No evidence of acute exacerbation.  Continue as needed bronchodilators   Rheumatoid arthritis Continue daily leflunomide    Hypothyroidism  Continue levothyroxine   Anxiety and depression Continue Zoloft.  Mood is stable    DVT prophylaxis: Eliquis on hold Code Status: DNR Family Communication: None at bedside Disposition:   Status is: Inpatient    Consultants:  Gastroenterology Subjective:  Currently she denies any complaints, no nausea, no vomiting, no rating oral intake  Objective: Vitals:   12/08/22 1250 12/08/22 1252 12/08/22 1300 12/08/22 1328  BP: (!) 193/90 (!) 210/76 (!) 182/77 (!) 175/65  Pulse: 90 74 83 86  Resp: (!) 26 16 (!) 24   Temp:  (!) 97.3 F (36.3 C)  98 F (36.7 C)  TempSrc:  Oral  Oral  SpO2: 92% 92% 93% 94%  Weight:      Height:       No intake or output data in the 24 hours ending 12/08/22 1336 Filed Weights   12/08/22 0619  Weight: 58.1 kg    Examination:  Awake Alert, Oriented X 3, No new F.N deficits, Normal affect Symmetrical Chest wall movement, Good air movement bilaterally, CTAB RRR,No Gallops,Rubs or new Murmurs, No Parasternal Heave +ve B.Sounds, Abd Soft, No tenderness, No rebound - guarding or rigidity. No Cyanosis, Clubbing or edema, No new Rash or bruise      Data Reviewed: I have personally reviewed following labs and imaging studies  CBC: Recent Labs  Lab 12/06/22 1352 12/07/22 2140 12/08/22 0438 12/08/22 0916  WBC  --  9.3 11.9* 11.2*  HGB 8.8* 8.5* 7.8* 8.0*  HCT  --  27.4* 24.3* 25.9*  MCV  --  99.3 97.6 100.8*  PLT  --  326 320 298    Basic Metabolic Panel: Recent Labs  Lab 12/07/22 2140 12/08/22 0815  NA 136 136  K 5.2* 4.1  CL 104 104  CO2 15* 22  GLUCOSE 141* 110*  BUN 79* 69*  CREATININE 3.68* 3.66*  CALCIUM 9.1 9.2    GFR: Estimated Creatinine Clearance: 9.5 mL/min (A) (by C-G formula based on SCr of 3.66 mg/dL (H)).  Liver Function  Tests: Recent Labs  Lab 12/07/22 2140  AST 29  ALT 17  ALKPHOS 85  BILITOT 1.2  PROT 6.1*  ALBUMIN 3.2*    CBG: No results for input(s): "GLUCAP" in the last 168 hours.   No results found for this or any previous visit (from the past 240 hour(s)).       Radiology Studies: CT ANGIO GI BLEED  Result Date: 12/08/2022 CLINICAL DATA:  GI bleed EXAM: CTA ABDOMEN AND PELVIS WITHOUT AND WITH CONTRAST TECHNIQUE: Multidetector CT imaging of the abdomen and pelvis was performed using the standard protocol during bolus administration of intravenous contrast. Multiplanar reconstructed images and MIPs were obtained and reviewed to evaluate the vascular anatomy. RADIATION DOSE REDUCTION: This exam was performed according to the departmental dose-optimization program which includes automated exposure control, adjustment of the mA and/or kV according to patient size and/or use of iterative reconstruction technique. CONTRAST:  75mL OMNIPAQUE IOHEXOL 350 MG/ML SOLN COMPARISON:  MRI abdomen/pelvis dated 11/10/2021 FINDINGS: Lower chest: 18 mm thin-walled lung cyst in the left lower lobe (series 7/image 19). Additional atelectasis/scarring in the bilateral lower lobes. Hepatobiliary: Nodular hepatic contour with enlargement of the left hepatic lobe, suggesting  cirrhosis. No focal hepatic lesion is seen. Status post cholecystectomy. No intrahepatic or extrahepatic duct dilatation. Pancreas: Within normal limits. Spleen: Surgically absent. Adrenals/Urinary Tract: Adrenal glands are within normal limits. Malrotated right kidney. Bilateral renal parenchymal atrophy. Bilateral renal cysts, including a dominant 3.7 cm simple cyst in the posterior right kidney (series 10/image 87) and a dominant 2.7 cm simple cyst in the lateral left upper kidney (series 10/image 39), benign (Bosniak I). No follow-up is recommended. Stomach/Bowel: Stomach is notable for a tiny hiatal hernia. No evidence of bowel obstruction. Suspected  appendectomy. Extensive sigmoid diverticulosis, without evidence of diverticulitis. Vascular/Lymphatic: No spillage of intraluminal contrast following contrast administration to suggest active GI bleeding. No evidence of abdominal aortic aneurysm. Atherosclerotic calcifications of the abdominal aorta and branch vessels. 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 Celiac artery, SMA, and IMA remain patent. No suspicious abdominopelvic lymphadenopathy. Reproductive: Uterus is grossly unremarkable. Bilateral ovaries are within normal limits. Other: No abdominopelvic ascites. Musculoskeletal: Degenerative changes of the visualized thoracolumbar spine. IMPRESSION: No evidence of active GI bleeding. Extensive sigmoid diverticulosis, without evidence of diverticulitis. Additional stable ancillary findings as above. Electronically Signed   By: Charline Bills M.D.   On: 12/08/2022 03:30        Scheduled Meds:  amLODipine  10 mg Oral Daily   atorvastatin  80 mg Oral Daily   carvedilol  3.125 mg Oral BID WC   [START ON 12/10/2022] cloNIDine  0.2 mg Transdermal Q Sun   hydrocortisone  25 mg Rectal BID   leflunomide  10 mg Oral Daily   levothyroxine  88 mcg Oral QAC breakfast   pantoprazole  40 mg Oral Daily   psyllium  1 packet Oral Daily   sertraline  25 mg Oral Daily   Continuous Infusions:   LOS: 0 days       Huey Bienenstock, MD [facility]   To contact the attending provider between 7A-7P or the covering provider during after hours 7P-7A, please log into the web site [email or web address] and access using universal [place] password for that web site. If you do not have the password, please call the hospital operator.  12/08/2022, 1:36 PM

## 2022-12-08 NOTE — Consult Note (Addendum)
Consultation Note   Referring Provider:  [facility] PCP: [name], MD Primary Gastroenterologist: Ileene Patrick, MD        Reason for Consultation: Lower GI bleeding  DOA: 12/07/2022         Hospital Day: 2   ASSESSMENT & PLAN   Patient is a 82 y.o. year old female with a past medical history of adenomatous colon polyps, AAA repair 38, CAD status post CABG in 1996, celiac artery stenosis, CKD IV, COPD, history of TIA/CVA , Hx of PE on Eliquis, RA, diverticulosis, diverticular hemorrhage, adenomatous colon polyps.   Recurrent painless hematochezia on Eliquis Hospitalized several days ago for same and felt to have diverticular vrs hemorrhoidal bleed based on colonoscopy findings and clinical picture. Current bleeding also likely to be a diverticular hemorrhage though hemorrhoidal bleeding not excluded with certainty. CTA negative for active bleeding -Trend hgb -Supportive care -Eliquis is on hold  HTN. SBP in 220's right now in ED.   History of PE on Eliquis, last dose was 8/7  ? Cirrhosis. CTA shows nodular liver contour and enlargement of left liver lobe. IF she does have cirrhosis there is no evidence for portal HTN by imaging DOUBT CIRRHOSIS SEE BELOW CEG -INR not reliable on Eliquis. Platelets normal.   CKD.  Creatinine  3.6 ( slightly above baseline)  See PMH for additional medical history   --------------------------------------------------------------------------------------------------------    [facility] Attending   I have taken an interval history, reviewed the chart and examined the patient. I agree with the Advanced Practitioner's note, impression and recommendations with the following additions:  1) I did an anoscopy w/ female staff present and she definitely has internal/external hemorrhoids w/ bleeding stigmata (especially LL - post) and there were brown stool balls present so I think  hemorrhoidal bleeding in setting of Eliquis (she is already on lowest dose)  2) She has a longstanding elongated left lobe of liver that is unusual but I disagree re: cirrhosis - clinically not evident - PLTs NL, no signs on exam  3) Let's hold Eliquis again and start hemorrhoidal suppositories. Could consider banding of hemorrhoids depending upon clinical course. We will f/u tomorrow. Add fiber also. Will advance diet.  Anoscopy - female staff present  Gr 2 internal external inflamed LL complex of internal external hemorrhoids and stigmata of bleeding. Brown stool balls. No proctitis. Smaller RA and RP hemorrhoids.  HISTORY OF PRESENT ILLNESS   Admitted the end of July for hematochezia on Eliquis.  Colonoscopy remarkable for diverticulosis and hemorrhoids, either the possible source of bleeding. EGD was unrevealing. She had a decline in hgb from 10.7 to 8.9 during that admission.   Following recent hospital discharge her bowel movements did return to normal . She returned to ED last night with recurrent painless hematochezia. Started bleeding again yesterday and has had several episodes of hematochezia since. Hgb 8.5 on arrival.  CTA at 2:40 am was negative for active GI bleeding. Hgb is now 8.  She has been slightly SOB getting up to bathroom. No chest pain. She otherwise feels okay  ED / Admission workup notable for :  WBC 9.3 Hgb 8.5 >> 8 Normal platelets BUN 79, Cr 3.68 LFTs normal.    Previous  GI Evaluations   Colonoscopy and EGD  11/28/22  Colonoscopy  Good prep - The examined portion of the ileum was normal. - Three 3 to 5 mm polyps in the transverse colon and in the ascending colon, removed with a cold snare. Resected and retrieved. - Diverticulosis in the sigmoid colon and in the descending colon. - Non-bleeding internal hemorrhoids.   EGD - Salmon-colored mucosa. Biopsied. - Hiatal hernia. - Gastritis. - Normal examined duodenum  FINAL MICROSCOPIC DIAGNOSIS:   A.  SALMON COLORED MUCOSA, BIOPSY:  - Benign gastroesophageal junctional mucosa with no specific pathologic  changes  - Negative for intestinal metaplasia, dysplasia or malignancy   B. COLON, ASCENDING AND TRANSVERSE, POLYPECTOMY:  - Tubular adenoma.  - No high grade dysplasia or malignancy.  - Hyperplastic polyp.  - No dysplasia or malignancy.    Labs and Imaging: Recent Labs    12/06/22 1352 12/07/22 2140 12/08/22 0438  WBC  --  9.3 11.9*  HGB 8.8* 8.5* 7.8*  HCT  --  27.4* 24.3*  PLT  --  326 320   Recent Labs    12/07/22 2140 12/08/22 0815  NA 136 136  K 5.2* 4.1  CL 104 104  CO2 15* 22  GLUCOSE 141* 110*  BUN 79* 69*  CREATININE 3.68* 3.66*  CALCIUM 9.1 9.2   Recent Labs    12/07/22 2140  PROT 6.1*  ALBUMIN 3.2*  AST 29  ALT 17  ALKPHOS 85  BILITOT 1.2   No results for input(s): "HEPBSAG", "HCVAB", "HEPAIGM", "HEPBIGM" in the last 72 hours. No results for input(s): "LABPROT", "INR" in the last 72 hours.    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 Most likely scar. Mild subendocardial ischemia. EF 71% LOW RISK.    Cervical disc disease    fracture   Chronic kidney disease    CKD4   COPD (chronic obstructive pulmonary disease) (HCC)    Diverticulosis    DVT (deep venous thrombosis) (HCC)    RLE;  Sept '23   History of DVT (deep vein thrombosis) 03/06/2022   History of pulmonary embolus (PE)    Oct '23   Hyperlipidemia    Hypertension    Hypothyroidism (acquired)    hypo   Myocardial infarction (HCC) 11/2014   TIA   Polymyalgia rheumatica (HCC)    2011 Dr. Kellie Simmering   Pulmonary emboli [facility]) 02/06/2022   01/2022 PE/DVT post-op. Lower GI bleed     Treat edema  Start Eliquis at the reduced dose due to Creat>1.5 and age >61   Rheumatoid arthritis (HCC) 2011   Dr.Truslow; fracture knees, hands and wrists -    S/P CABG x 1 1996   CABG--LIMA-LAD for ostial LAD (not felt to be PCI  amenable). EF NORMAL then; LIMA now atretic   Shortness of breath dyspnea    with exertion   Stroke (HCC) 11/2014   TIA    Urinary frequency     Past Surgical History:  Procedure Laterality Date   ABDOMINAL AORTIC ANEURYSM REPAIR  1996   Complicated by mesenteric artery stenosis and splenic artery infarction with acquired Asplenia   APPENDECTOMY     BIOPSY  07/08/2022   Procedure: BIOPSY;  Surgeon: Benancio Deeds, MD;  Location: [facility] ENDOSCOPY;  Service: Gastroenterology;;   BIOPSY  11/28/2022   Procedure: BIOPSY;  Surgeon: [name], MD;  Location: [facility] ENDOSCOPY;  Service: Gastroenterology;;   [name]  07/2011   right foot   CARDIAC CATHETERIZATION  2005   (Most recent CATH) - ostial LAD lesion 20-30% (down from 90% initially). Atretic LIMA. Minimal disease the RCA and Circumflex system.   CARPAL TUNNEL RELEASE Left    CATARACT EXTRACTION Bilateral    CERVICAL SPINE SURGERY     plate [id number] Dr. Wynetta Emery   COLONOSCOPY     COLONOSCOPY WITH PROPOFOL N/A 11/28/2022   Procedure: COLONOSCOPY WITH PROPOFOL;  Surgeon: [name], MD;  Location: [facility] ENDOSCOPY;  Service: Gastroenterology;  Laterality: N/A;   CORONARY ARTERY BYPASS GRAFT  1996   INCLUDED AN INTERNAL MAMMARY ARTERY TO THE LAD. EF WAS NORMAL   ESOPHAGOGASTRODUODENOSCOPY (EGD) WITH PROPOFOL N/A 07/08/2022   Procedure:  ESOPHAGOGASTRODUODENOSCOPY (EGD) WITH PROPOFOL;  Surgeon: Benancio Deeds, MD;  Location: [facility] ENDOSCOPY;  Service: Gastroenterology;  Laterality: N/A;   ESOPHAGOGASTRODUODENOSCOPY (EGD) WITH PROPOFOL N/A 11/28/2022   Procedure: ESOPHAGOGASTRODUODENOSCOPY (EGD) WITH PROPOFOL;  Surgeon: [name], MD;  Location: [facility] ENDOSCOPY;  Service: Gastroenterology;  Laterality: N/A;   INGUINAL HERNIA REPAIR Right    LAPAROSCOPIC APPENDECTOMY N/A 06/28/2016   Procedure: APPENDECTOMY LAPAROSCOPIC;  Surgeon: [name], MD;  Location: [facility] ORS;  Service: General;  Laterality: N/A;   NM MYOVIEW LTD  06/2010   Fixed anteroseptal, apical and inferoapical defect with moderate size. Most likely scar. Mild subendocardial ischemia. EF 71% LOW RISK.    POLYPECTOMY  11/28/2022   Procedure: POLYPECTOMY;  Surgeon: [name], MD;  Location: [facility] ENDOSCOPY;  Service: Gastroenterology;;   SPLENECTOMY     TOTAL HIP ARTHROPLASTY Left 01/23/2022   Procedure: LEFT TOTAL HIP ARTHROPLASTY ANTERIOR APPROACH;  Surgeon: [name], MD;  Location: [facility] OR;  Service: Orthopedics;  Laterality: Left;  3-C   TRANSTHORACIC ECHOCARDIOGRAM  12/2014   [facility]: Normal LV size & function. EF 55-60%,    vagina polyp     VIDEO ASSISTED THORACOSCOPY (VATS)/WEDGE RESECTION Left 03/02/2015   Procedure: VIDEO ASSISTED THORACOSCOPY (VATS), MINI THORACOTOMY, LEFT UPPER LOBE WEDGE, TAKE DOWN OF INTERNAL MAMMARY LESIONS, PLACEMENT OF ON-Q PUMP;  Surgeon: [name], MD;  Location: [facility] OR;  Service: Thoracic;  Laterality: Left;   VIDEO BRONCHOSCOPY N/A 03/02/2015   Procedure: BRONCHOSCOPY;  Surgeon: [name], MD;  Location: [facility] OR;  Service: Thoracic;  Laterality: N/A;   VIDEO BRONCHOSCOPY WITH ENDOBRONCHIAL NAVIGATION N/A 10/08/2017   Procedure: VIDEO BRONCHOSCOPY WITH ENDOBRONCHIAL NAVIGATION WITH BIOPSIES OF LEFT UPPER LOBE AND LEFT LOWER LOBE;  Surgeon: [name], MD;  Location: [facility] OR;  Service: Thoracic;   Laterality: N/A;    Family History  Problem Relation Age of Onset   Hypertension Mother    Diabetes Mother    Heart disease Mother    Hyperlipidemia Mother    Stroke Father  Hyperlipidemia Sister    Hypertension Sister    Hypertension Daughter    Cancer Paternal Uncle        Deceased from cancer not sure of site   Hypertension Son    Hyperlipidemia Son    Hyperlipidemia Son    Hypertension Son    Hyperlipidemia Son    Hypertension Son    Hypertension Other    Coronary artery disease Other    Asthma Neg Hx    Colon cancer Neg Hx     Prior to Admission medications   Medication Sig Start Date End Date Taking? Authorizing Provider  acetaminophen (TYLENOL) 325 MG tablet Take 2 tablets (650 mg total) by mouth 2 (two) times daily as needed (generalized pain). 08/24/22   Mast, Man X, NP  albuterol (VENTOLIN HFA) 108 (90 Base) MCG/ACT inhaler Inhale 1-2 puffs into the lungs every 4 (four) hours as needed for wheezing or shortness of breath. 03/27/22   Mast, Man X, NP  apixaban (ELIQUIS) 2.5 MG TABS tablet Take 1 tablet (2.5 mg total) by mouth 2 (two) times daily. 08/24/22   Mast, Man X, NP  ascorbic acid (VITAMIN C) 500 MG tablet Take 1 tablet (500 mg total) by mouth daily. 03/27/22   Mast, Man X, NP  atorvastatin (LIPITOR) 80 MG tablet Take 1 tablet (80 mg total) by mouth daily. 08/24/22   Mast, Man X, NP  carvedilol (COREG) 6.25 MG tablet Take 6.25 mg by mouth 2 (two) times daily with a meal. Patient not taking: Reported on 11/27/2022    [provider]  Cholecalciferol (VITAMIN D3) 50 MCG (2000 UT) capsule Take 1 capsule (2,000 Units total) by mouth daily. 03/27/22   Mast, Man X, NP  cloNIDine (CATAPRES - DOSED IN MG/24 HR) 0.1 mg/24hr patch Place 1 patch (0.1 mg total) onto the skin once a week. Patient taking differently: Place 0.2 mg onto the skin once a week. 10/19/22   Plotnikov, Georgina Quint, MD  cloNIDine (CATAPRES) 0.2 MG tablet Take 1 tablet (0.2 mg total) by mouth 4  (four) times daily as needed for up to 30 doses. For systolic blood pressure (top number) over 200 mmhg 10/19/22   Plotnikov, Georgina Quint, MD  Cyanocobalamin 2500 MCG TABS Take 2,500 mcg by mouth daily. 08/24/22   Mast, Man X, NP  docusate sodium (COLACE) 100 MG capsule Take 1-2 capsules (100-200 mg total) by mouth daily as needed for mild constipation. Patient not taking: Reported on 11/27/2022 08/24/22   Mast, Man X, NP  Fexofenadine HCl (ALLERGY 24-HR PO) Take 1 tablet by mouth in the morning and at bedtime.    [provider]  Golimumab 50 MG/0.5ML SOAJ Take 50 mg by mouth every 2 (two) months. Hold for the next 2 weeks per Orthopedic surgery recommendations. Patient not taking: Reported on 11/27/2022 03/27/22   Mast, Man X, NP  leflunomide (ARAVA) 20 MG tablet Take 1 tablet (20 mg total) by mouth daily. Patient taking differently: Take 10 mg by mouth daily. 08/24/22   Mast, Man X, NP  levothyroxine (SYNTHROID) 88 MCG tablet Take 1 tablet (88 mcg total) by mouth daily before breakfast. 08/24/22   Mast, Man X, NP  losartan (COZAAR) 50 MG tablet Take 1 tablet (50 mg total) by mouth daily. Keep Aug appt for future refills 11/24/22   Plotnikov, Georgina Quint, MD  Multiple Vitamins-Minerals (CENTRUM ADULT PO) Take 1 tablet by mouth daily. 1 Tablet Daily.    [provider]  pantoprazole (PROTONIX) 40 MG  tablet Take 1 tablet (40 mg total) by mouth daily. 08/24/22   Mast, Man X, NP  polyethylene glycol (MIRALAX / GLYCOLAX) 17 g packet Take 17 g by mouth daily as needed. Patient not taking: Reported on 11/27/2022 08/24/22   Mast, Man X, NP  sertraline (ZOLOFT) 50 MG tablet Take 1 tablet (50 mg total) by mouth daily. Patient taking differently: Take 25 mg by mouth daily. 10/19/22   Plotnikov, Georgina Quint, MD  triamcinolone (NASACORT) 55 MCG/ACT AERO nasal inhaler Place 1 spray into the nose daily. 08/24/22   Mast, Man X, NP    Current Facility-Administered Medications  Medication Dose Route Frequency  Provider Last Rate Last Admin   acetaminophen (TYLENOL) tablet 650 mg  650 mg Oral Q6H PRN Poplawski, Rafal, MD       Or   acetaminophen (TYLENOL) suppository 650 mg  650 mg Rectal Q6H PRN Poplawski, Rafal, MD       albuterol (PROVENTIL) (2.5 MG/3ML) 0.083% nebulizer solution 2.5 mg  2.5 mg Nebulization Q2H PRN Poplawski, Rafal, MD       amLODipine (NORVASC) tablet 10 mg  10 mg Oral Daily Elgergawy, Leana Roe, MD   10 mg at 12/08/22 0752   atorvastatin (LIPITOR) tablet 80 mg  80 mg Oral Daily Poplawski, Rafal, MD   80 mg at 12/08/22 0911   carvedilol (COREG) tablet 3.125 mg  3.125 mg Oral BID WC Jeanelle Malling, PA   3.125 mg at 12/08/22 0752   [START ON 12/10/2022] cloNIDine (CATAPRES - Dosed in mg/24 hr) patch 0.2 mg  0.2 mg Transdermal Q Bary Castilla, Rafal, MD       cloNIDine (CATAPRES) tablet 0.1 mg  0.1 mg Oral QID PRN Poplawski, Rafal, MD   0.1 mg at 12/08/22 0609   hydrALAZINE (APRESOLINE) injection 10 mg  10 mg Intravenous Q4H PRN Elgergawy, Leana Roe, MD       leflunomide (ARAVA) tablet 10 mg  10 mg Oral Daily Poplawski, Rafal, MD       levothyroxine (SYNTHROID) tablet 88 mcg  88 mcg Oral QAC breakfast Poplawski, Rafal, MD   88 mcg at 12/08/22 0752   ondansetron (ZOFRAN) tablet 4 mg  4 mg Oral Q6H PRN Poplawski, Rafal, MD       Or   ondansetron (ZOFRAN) injection 4 mg  4 mg Intravenous Q6H PRN Poplawski, Rafal, MD       pantoprazole (PROTONIX) EC tablet 40 mg  40 mg Oral Daily Poplawski, Rafal, MD   40 mg at 12/08/22 0911   sertraline (ZOLOFT) tablet 25 mg  25 mg Oral Daily Poplawski, Rafal, MD   25 mg at 12/08/22 0911   Current Outpatient Medications  Medication Sig Dispense Refill   acetaminophen (TYLENOL) 325 MG tablet Take 2 tablets (650 mg total) by mouth 2 (two) times daily as needed (generalized pain). 100 tablet 2   albuterol (VENTOLIN HFA) 108 (90 Base) MCG/ACT inhaler Inhale 1-2 puffs into the lungs every 4 (four) hours as needed for wheezing or shortness of breath. 1 each 2    apixaban (ELIQUIS) 2.5 MG TABS tablet Take 1 tablet (2.5 mg total) by mouth 2 (two) times daily. 180 tablet 1   ascorbic acid (VITAMIN C) 500 MG tablet Take 1 tablet (500 mg total) by mouth daily. 30 tablet 2   atorvastatin (LIPITOR) 80 MG tablet Take 1 tablet (80 mg total) by mouth daily. 90 tablet 1   carvedilol (COREG) 6.25 MG tablet Take 6.25 mg by mouth 2 (two) times  daily with a meal. (Patient not taking: Reported on 11/27/2022)     Cholecalciferol (VITAMIN D3) 50 MCG (2000 UT) capsule Take 1 capsule (2,000 Units total) by mouth daily. 100 capsule 3   cloNIDine (CATAPRES - DOSED IN MG/24 HR) 0.1 mg/24hr patch Place 1 patch (0.1 mg total) onto the skin once a week. (Patient taking differently: Place 0.2 mg onto the skin once a week.) 4 patch 12   cloNIDine (CATAPRES) 0.2 MG tablet Take 1 tablet (0.2 mg total) by mouth 4 (four) times daily as needed for up to 30 doses. For systolic blood pressure (top number) over 200 mmhg 100 tablet 3   Cyanocobalamin 2500 MCG TABS Take 2,500 mcg by mouth daily. 30 tablet 3   docusate sodium (COLACE) 100 MG capsule Take 1-2 capsules (100-200 mg total) by mouth daily as needed for mild constipation. (Patient not taking: Reported on 11/27/2022) 100 capsule 1   Fexofenadine HCl (ALLERGY 24-HR PO) Take 1 tablet by mouth in the morning and at bedtime.     Golimumab 50 MG/0.5ML SOAJ Take 50 mg by mouth every 2 (two) months. Hold for the next 2 weeks per Orthopedic surgery recommendations. (Patient not taking: Reported on 11/27/2022) 0.3 mL    leflunomide (ARAVA) 20 MG tablet Take 1 tablet (20 mg total) by mouth daily. (Patient taking differently: Take 10 mg by mouth daily.) 30 tablet 1   levothyroxine (SYNTHROID) 88 MCG tablet Take 1 tablet (88 mcg total) by mouth daily before breakfast. 90 tablet 1   losartan (COZAAR) 50 MG tablet Take 1 tablet (50 mg total) by mouth daily. Keep Aug appt for future refills 90 tablet 0   Multiple Vitamins-Minerals (CENTRUM ADULT PO) Take 1  tablet by mouth daily. 1 Tablet Daily.     pantoprazole (PROTONIX) 40 MG tablet Take 1 tablet (40 mg total) by mouth daily. 180 tablet 1   polyethylene glycol (MIRALAX / GLYCOLAX) 17 g packet Take 17 g by mouth daily as needed. (Patient not taking: Reported on 11/27/2022) 14 each 1   sertraline (ZOLOFT) 50 MG tablet Take 1 tablet (50 mg total) by mouth daily. (Patient taking differently: Take 25 mg by mouth daily.) 90 tablet 3   triamcinolone (NASACORT) 55 MCG/ACT AERO nasal inhaler Place 1 spray into the nose daily. 1 each 3    Allergies as of 12/07/2022 - Review Complete 12/07/2022  Allergen Reaction Noted   Nsaids Other (See Comments) 01/16/2022   Aspirin Other (See Comments)    Codeine Nausea And Vomiting    Nitrostat [nitroglycerin] Other (See Comments)     Social History   Socioeconomic History   Marital status: Married    Spouse name: Not on file   Number of children: 4   Years of education: Not on file   Highest education level: Bachelor's degree (e.g., BA, AB, BS)  Occupational History   Occupation: retired    Associate Professor: RETIRED  Tobacco Use   Smoking status: Former    Current packs/day: 0.00    Types: Cigarettes    Quit date: 12/04/1958    Years since quitting: 64.0    Passive exposure: Never   Smokeless tobacco: Never  Vaping Use   Vaping status: Never Used  Substance and Sexual Activity   Alcohol use: Yes    Comment: hardly ever   Drug use: No   Sexual activity: Not Currently  Other Topics Concern   Not on file  Social History Narrative   [facility]  MARRIED -RETIRED ; 4 CHILDREN,2 CHILDREN.    Living at [facility] since dec 2015   Social Determinants of Health   Financial Resource Strain: Low Risk  (09/05/2022)   Overall Financial Resource Strain (CARDIA)    Difficulty of Paying Living Expenses: Not hard at all  Food Insecurity: No Food Insecurity (11/30/2022)   Hunger Vital Sign    Worried About Running Out of Food in the Last  Year: Never true    Ran Out of Food in the Last Year: Never true  Transportation Needs: No Transportation Needs (11/30/2022)   PRAPARE - Administrator, Civil Service (Medical): No    Lack of Transportation (Non-Medical): No  Physical Activity: Insufficiently Active (10/30/2022)   Exercise Vital Sign    Days of Exercise per Week: 1 day    Minutes of Exercise per Session: 10 min  Stress: Stress Concern Present (10/30/2022)   [employer] of Occupational Health - Occupational Stress Questionnaire    Feeling of Stress : To some extent  Social Connections: Moderately Integrated (09/05/2022)   Social Connection and Isolation Panel [NHANES]    Frequency of Communication with Friends and Family: More than three times a week    Frequency of Social Gatherings with Friends and Family: Patient declined    Attends Religious Services: Never    Database administrator or Organizations: Yes    Attends Banker Meetings: Patient declined    Marital Status: Married  Catering manager Violence: Patient Declined (11/27/2022)   Humiliation, Afraid, Rape, and Kick questionnaire    Fear of Current or Ex-Partner: Patient declined    Emotionally Abused: Patient declined    Physically Abused: Patient declined    Sexually Abused: Patient declined     Code Status   Code Status: DNR  Review of Systems: All systems reviewed and negative except where noted in HPI.  Physical Exam: Vital signs in last 24 hours: Temp:  [97.7 F (36.5 C)-98.2 F (36.8 C)] 97.8 F (36.6 C) (08/09 0620) Pulse Rate:  [78-106] 93 (08/09 0752) Resp:  [18-26] 21 (08/09 0620) BP: (142-262)/(70-149) 209/82 (08/09 0752) SpO2:  [91 %-96 %] 94 % (08/09 0620) Weight:  [58.1 kg] 58.1 kg (08/09 6213)   General:  Pleasant female in NAD Psych:  Cooperative. Normal mood and affect Eyes: Pupils equal Ears:  Normal auditory acuity Nose: No deformity, discharge or lesions Neck:  Supple, no masses felt Lungs:  Clear  to auscultation.  Heart:  Regular rate.  Abdomen:  Soft, nondistended, nontender, active bowel sounds, no masses felt Rectal : Scant amount of thin, bloody ( bright red) liquid in vault.  Msk: Symmetrical without gross deformities.  Neurologic:  Alert, oriented, grossly normal neurologically Extremities : No edema Skin:  Intact without significant lesions.    Intake/Output from previous day: No intake/output data recorded. Intake/Output this shift:  No intake/output data recorded.  Principal Problem:   GI bleed Active Problems:   Acquired hypothyroidism   Essential hypertension   Rheumatoid arthritis (HCC)   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; now atretic and significantly improved LAD lesion without intervention   Hyperlipidemia   COPD (chronic obstructive pulmonary disease) (HCC)   CKD (chronic kidney disease) stage 4, GFR 15-29 ml/min (HCC) - baseline SCr 2.5. follows with Dr. Malen Gauze with [facility]   GERD (gastroesophageal reflux disease)   Chronic anticoagulation   Lower GI bleed    Willette Cluster, NP-C @  12/08/2022, 9:47 AM

## 2022-12-08 NOTE — ED Notes (Signed)
Pt did not have blood in last bm

## 2022-12-08 NOTE — H&P (Signed)
History and Physical    [name] UJW:[id number] DOB: 1941-02-19 DOA: 12/07/2022  PCP: [name], MD   Patient coming from: Home   I have personally briefly reviewed patient's old medical records in [facility]  Chief Complaint:  BRBPR  HPI: [name] is a 82 y.o. 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   Patient stated that since yesterday morning, she had several episodes of bright red blood per rectum. First episode was in the morning, then again she reported 3 episodes prior to arrival and while being monitored in the ED. Patient reported no fever or chills, nausea vomiting or abdominal pain. Patient denied chest pain, shortness of breath.  Patient is on Eliquis given prior history of PE/DVT.  Patient was hospitalized for similar episodes from 7/28 to 7/31 at [facility]. She was seen by gastroenterology and underwent EGD and colonoscopy on 11/28/22. EGD showed mild gastritis, salmon colored mucosa in esophagus that was biopsied. prior gastric ulcers that have healed. Colonoscopy showed 3 small polyps that were removed, diverticulosis, hemorrhoids. It was suspected that her bleeding is likely due to a diverticular bleed.  On 7/29 patient had as well tagged RBC scan that was negative for source of GI bleeding.  ED Course:   In the ED patient was afebrile with temperature 98.2 F.  Blood pressure was elevated, 210/87 with heart rate 88/min.  No hypoxia, 92% on room air. CBC showed anemia with hemoglobin 8.8, with subsequent repeat 6 hours later down to 7.8, with mild leukocytosis 11.9.  Chemistry showed elevation of BUN/creatinine 79 and 3.6, stable with mild  hyperkalemia at 5.2, anion gap metabolic acidosis CO2 15.  Iron profile on 8/7, revealed no significant deficiency, TSAT 28, ferritin 453. CT angio GI bleed, showed no evidence of active GI bleeding, extensive sigmoid diverticulosis, without evidence of diverticulitis. [facility] [place], Dr Myrtie Neither, was consulted and will see patient in the morning.  Review of Systems: Review of Systems  Constitutional:  Positive for malaise/fatigue. Negative for chills and fever.  HENT:  Negative for congestion, ear pain, hearing loss, nosebleeds and sinus pain.   Eyes:  Negative for blurred vision, double vision, pain and discharge.  Respiratory:  Negative for cough, hemoptysis, sputum production, shortness of breath and wheezing.   Cardiovascular:  Negative for chest pain, palpitations, orthopnea and leg swelling.  Gastrointestinal:  Positive for blood in stool and constipation. Negative for abdominal pain, diarrhea, nausea and vomiting.  Genitourinary:  Negative for dysuria, frequency, hematuria and urgency.  Musculoskeletal:  Negative for back pain, joint pain, myalgias and neck pain.  Skin:  Negative for rash.  Neurological:  Negative for dizziness, sensory change, focal weakness and headaches.  Endo/Heme/Allergies:  Negative for environmental allergies. Does not bruise/bleed easily.  Psychiatric/Behavioral:  Negative for depression, hallucinations and suicidal ideas. The patient is not nervous/anxious.    As per HPI otherwise all other systems reviewed and are negative.   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 Most likely scar. Mild subendocardial ischemia. EF 71% LOW RISK.    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 Kellie Simmering   Pulmonary emboli [facility]) 02/06/2022   01/2022 PE/DVT post-op. Lower GI bleed     Treat edema  Start Eliquis at the reduced dose due to Creat>1.5 and age >86   Rheumatoid arthritis (HCC) 2011   Dr.Truslow; fracture knees, hands and wrists -    S/P CABG x 1 1996   CABG--LIMA-LAD for ostial LAD (not felt to be PCI amenable). EF NORMAL then; LIMA now atretic   Shortness of breath dyspnea    with exertion   Stroke (HCC) 11/2014   TIA    Urinary frequency     Past Surgical History:  Procedure Laterality Date   ABDOMINAL AORTIC ANEURYSM REPAIR  1996   Complicated by mesenteric artery stenosis and splenic artery infarction with acquired Asplenia   APPENDECTOMY     BIOPSY  07/08/2022   Procedure: BIOPSY;  Surgeon: Benancio Deeds, MD;  Location: [facility] ENDOSCOPY;  Service: Gastroenterology;;   BIOPSY  11/28/2022   Procedure: BIOPSY;  Surgeon: [name], MD;  Location: [facility] ENDOSCOPY;  Service: Gastroenterology;;   [name]  07/2011   right foot   CARDIAC CATHETERIZATION  2005   (Most recent CATH) - ostial LAD lesion 20-30% (down from 90% initially). Atretic LIMA. Minimal disease the RCA and  Circumflex system.   CARPAL TUNNEL RELEASE Left    CATARACT EXTRACTION Bilateral    CERVICAL SPINE SURGERY     plate [id number] Dr. Wynetta Emery   COLONOSCOPY     COLONOSCOPY WITH PROPOFOL N/A 11/28/2022   Procedure: COLONOSCOPY WITH PROPOFOL;  Surgeon: [name], MD;  Location: [facility] ENDOSCOPY;  Service: Gastroenterology;  Laterality: N/A;   CORONARY ARTERY BYPASS GRAFT  1996   INCLUDED AN INTERNAL MAMMARY ARTERY TO THE LAD. EF WAS NORMAL   ESOPHAGOGASTRODUODENOSCOPY (EGD) WITH PROPOFOL N/A 07/08/2022   Procedure: ESOPHAGOGASTRODUODENOSCOPY (EGD) WITH PROPOFOL;  Surgeon: Benancio Deeds, MD;  Location: [facility] ENDOSCOPY;  Service: Gastroenterology;  Laterality: N/A;   ESOPHAGOGASTRODUODENOSCOPY (EGD) WITH PROPOFOL N/A 11/28/2022   Procedure: ESOPHAGOGASTRODUODENOSCOPY (EGD) WITH PROPOFOL;  Surgeon: [name], MD;  Location: [facility] ENDOSCOPY;  Service: Gastroenterology;  Laterality: N/A;   INGUINAL HERNIA REPAIR Right    LAPAROSCOPIC APPENDECTOMY N/A 06/28/2016   Procedure: APPENDECTOMY LAPAROSCOPIC;  Surgeon: [name], MD;  Location: [facility] ORS;  Service: General;  Laterality: N/A;   NM MYOVIEW LTD  06/2010  Fixed anteroseptal, apical and inferoapical defect with moderate size. Most likely scar. Mild subendocardial ischemia. EF 71% LOW RISK.    POLYPECTOMY  11/28/2022   Procedure: POLYPECTOMY;  Surgeon: [name], MD;  Location: [facility] ENDOSCOPY;  Service: Gastroenterology;;   SPLENECTOMY     TOTAL HIP ARTHROPLASTY Left 01/23/2022   Procedure: LEFT TOTAL HIP ARTHROPLASTY ANTERIOR APPROACH;  Surgeon: [name], MD;  Location: [facility] OR;  Service: Orthopedics;  Laterality: Left;  3-C   TRANSTHORACIC ECHOCARDIOGRAM  12/2014   [facility]: Normal LV size & function. EF 55-60%,    vagina polyp     VIDEO ASSISTED THORACOSCOPY (VATS)/WEDGE RESECTION Left 03/02/2015   Procedure: VIDEO ASSISTED THORACOSCOPY (VATS), MINI THORACOTOMY, LEFT UPPER LOBE WEDGE, TAKE DOWN OF INTERNAL MAMMARY LESIONS,  PLACEMENT OF ON-Q PUMP;  Surgeon: [name], MD;  Location: [facility] OR;  Service: Thoracic;  Laterality: Left;   VIDEO BRONCHOSCOPY N/A 03/02/2015   Procedure: BRONCHOSCOPY;  Surgeon: [name], MD;  Location: [facility] OR;  Service: Thoracic;  Laterality: N/A;   VIDEO BRONCHOSCOPY WITH ENDOBRONCHIAL NAVIGATION N/A 10/08/2017   Procedure: VIDEO BRONCHOSCOPY WITH ENDOBRONCHIAL NAVIGATION WITH BIOPSIES OF LEFT UPPER LOBE AND LEFT LOWER LOBE;  Surgeon: [name], MD;  Location: [facility] OR;  Service: Thoracic;  Laterality: N/A;    Social History  reports that she quit smoking about 64 years ago. Her smoking use included cigarettes. She has never been exposed to tobacco smoke. She has never used smokeless tobacco. She reports current alcohol use. She reports that she does not use drugs.  Allergies  Allergen Reactions   Nsaids Other (See Comments)    Stomach upset Told to avoid due to Eliquis   Aspirin Other (See Comments)    Stomach upset Told to avoid due to Eliquis   Codeine Nausea And Vomiting   Nitrostat [Nitroglycerin] Other (See Comments)    Bradycardia. Drop in heart rate     Family History  Problem Relation Age of Onset   Hypertension Mother    Diabetes Mother    Heart disease Mother    Hyperlipidemia Mother    Stroke Father    Hyperlipidemia Sister    Hypertension Sister    Hypertension Daughter    Cancer Paternal Uncle        Deceased from cancer not sure of site   Hypertension Son    Hyperlipidemia Son    Hyperlipidemia Son    Hypertension Son    Hyperlipidemia Son    Hypertension Son    Hypertension Other    Coronary artery disease Other    Asthma Neg Hx    Colon cancer Neg Hx    Prior to Admission medications   Medication Sig Start Date End Date Taking? Authorizing Provider  acetaminophen (TYLENOL) 325 MG tablet Take 2 tablets (650 mg total) by mouth 2 (two) times daily as needed (generalized pain). 08/24/22   Mast, Man X, NP  albuterol (VENTOLIN HFA)  108 (90 Base) MCG/ACT inhaler Inhale 1-2 puffs into the lungs every 4 (four) hours as needed for wheezing or shortness of breath. 03/27/22   Mast, Man X, NP  apixaban (ELIQUIS) 2.5 MG TABS tablet Take 1 tablet (2.5 mg total) by mouth 2 (two) times daily. 08/24/22   Mast, Man X, NP  ascorbic acid (VITAMIN C) 500 MG tablet Take 1 tablet (500 mg total) by mouth daily. 03/27/22   Mast, Man X, NP  atorvastatin (LIPITOR) 80 MG tablet Take 1 tablet (80 mg  total) by mouth daily. 08/24/22   Mast, Man X, NP  carvedilol (COREG) 6.25 MG tablet Take 6.25 mg by mouth 2 (two) times daily with a meal. Patient not taking: Reported on 11/27/2022    [provider]  Cholecalciferol (VITAMIN D3) 50 MCG (2000 UT) capsule Take 1 capsule (2,000 Units total) by mouth daily. 03/27/22   Mast, Man X, NP  cloNIDine (CATAPRES - DOSED IN MG/24 HR) 0.1 mg/24hr patch Place 1 patch (0.1 mg total) onto the skin once a week. Patient taking differently: Place 0.2 mg onto the skin once a week. 10/19/22   Plotnikov, Georgina Quint, MD  cloNIDine (CATAPRES) 0.2 MG tablet Take 1 tablet (0.2 mg total) by mouth 4 (four) times daily as needed for up to 30 doses. For systolic blood pressure (top number) over 200 mmhg 10/19/22   Plotnikov, Georgina Quint, MD  clotrimazole-betamethasone (LOTRISONE) cream Apply 1 Application topically 2 (two) times daily. Mouth corner rash Patient not taking: Reported on 11/27/2022 09/05/22 09/05/23  Plotnikov, Georgina Quint, MD  Cyanocobalamin 2500 MCG TABS Take 2,500 mcg by mouth daily. 08/24/22   Mast, Man X, NP  docusate sodium (COLACE) 100 MG capsule Take 1-2 capsules (100-200 mg total) by mouth daily as needed for mild constipation. Patient not taking: Reported on 11/27/2022 08/24/22   Mast, Man X, NP  Fexofenadine HCl (ALLERGY 24-HR PO) Take 1 tablet by mouth in the morning and at bedtime.    [provider]  Golimumab 50 MG/0.5ML SOAJ Take 50 mg by mouth every 2 (two) months. Hold for the next 2 weeks per  Orthopedic surgery recommendations. Patient not taking: Reported on 11/27/2022 03/27/22   Mast, Man X, NP  leflunomide (ARAVA) 20 MG tablet Take 1 tablet (20 mg total) by mouth daily. Patient taking differently: Take 10 mg by mouth daily. 08/24/22   Mast, Man X, NP  levothyroxine (SYNTHROID) 88 MCG tablet Take 1 tablet (88 mcg total) by mouth daily before breakfast. 08/24/22   Mast, Man X, NP  losartan (COZAAR) 50 MG tablet Take 1 tablet (50 mg total) by mouth daily. Keep Aug appt for future refills 11/24/22   Plotnikov, Georgina Quint, MD  Multiple Vitamins-Minerals (CENTRUM ADULT PO) Take 1 tablet by mouth daily. 1 Tablet Daily.    [provider]  pantoprazole (PROTONIX) 40 MG tablet Take 1 tablet (40 mg total) by mouth daily. 08/24/22   Mast, Man X, NP  polyethylene glycol (MIRALAX / GLYCOLAX) 17 g packet Take 17 g by mouth daily as needed. Patient not taking: Reported on 11/27/2022 08/24/22   Mast, Man X, NP  sertraline (ZOLOFT) 50 MG tablet Take 1 tablet (50 mg total) by mouth daily. Patient taking differently: Take 25 mg by mouth daily. 10/19/22   Plotnikov, Georgina Quint, MD  triamcinolone (NASACORT) 55 MCG/ACT AERO nasal inhaler Place 1 spray into the nose daily. 08/24/22   Mast, Man X, NP    Physical Exam: Vitals:   12/08/22 0115 12/08/22 0243 12/08/22 0405 12/08/22 0523  BP: (!) 208/70 (!) 224/82 (!) 257/101 (!) 210/87  Pulse: 88 81 (!) 103 88  Resp: (!) 23 (!) 24 (!) 26 19  Temp:  97.7 F (36.5 C)    TempSrc:  Oral    SpO2: 96% 95% 91% 92%    Constitutional: NAD, calm, comfortable Vitals:   12/08/22 0115 12/08/22 0243 12/08/22 0405 12/08/22 0523  BP: (!) 208/70 (!) 224/82 (!) 257/101 (!) 210/87  Pulse: 88 81 (!) 103 88  Resp: [name])  23 (!) 24 (!) 26 19  Temp:  97.7 F (36.5 C)    TempSrc:  Oral    SpO2: 96% 95% 91% 92%   Eyes: PERRL, lids and conjunctivae normal ENMT: Mucous membranes are moist. Posterior pharynx clear of any exudate or lesions.Normal dentition.  Neck: normal,  supple, no masses, no thyromegaly Respiratory: clear to auscultation bilaterally, no wheezing, no crackles. Normal respiratory effort. No accessory muscle use.  Cardiovascular: Regular rate and rhythm, no murmurs / rubs / gallops. No extremity edema. 2+ pedal pulses. No carotid bruits.  Abdomen: no tenderness, no masses palpated. No hepatosplenomegaly. Bowel sounds positive.  Musculoskeletal: no clubbing / cyanosis. No joint deformity upper and lower extremities. Good ROM, no contractures. Normal muscle tone.  Skin: no rashes, lesions, ulcers. No induration Neurologic: CN 2-12 grossly intact. Sensation intact, DTR normal. Strength 5/5 in all 4.  Psychiatric: Normal judgment and insight. Alert and oriented x 3. Normal mood.   Labs on Admission: I have personally reviewed following labs and imaging studies  CBC: Recent Labs  Lab 12/06/22 1352 12/07/22 2140 12/08/22 0438  WBC  --  9.3 11.9*  HGB 8.8* 8.5* 7.8*  HCT  --  27.4* 24.3*  MCV  --  99.3 97.6  PLT  --  326 320    Basic Metabolic Panel: Recent Labs  Lab 12/07/22 2140  NA 136  K 5.2*  CL 104  CO2 15*  GLUCOSE 141*  BUN 79*  CREATININE 3.68*  CALCIUM 9.1    GFR: Estimated Creatinine Clearance: 9.4 mL/min (A) (by C-G formula based on SCr of 3.68 mg/dL (H)).  Liver Function Tests: Recent Labs  Lab 12/07/22 2140  AST 29  ALT 17  ALKPHOS 85  BILITOT 1.2  PROT 6.1*  ALBUMIN 3.2*    Urine analysis:    Component Value Date/Time   COLORURINE YELLOW 09/05/2022 1155   APPEARANCEUR CLEAR 09/05/2022 1155   LABSPEC <=1.005 (A) 09/05/2022 1155   PHURINE 6.0 09/05/2022 1155   GLUCOSEU NEGATIVE 09/05/2022 1155   HGBUR SMALL (A) 09/05/2022 1155   BILIRUBINUR NEGATIVE 09/05/2022 1155   KETONESUR NEGATIVE 09/05/2022 1155   PROTEINUR 100 (A) 07/22/2022 0805   UROBILINOGEN 0.2 09/05/2022 1155   NITRITE NEGATIVE 09/05/2022 1155   LEUKOCYTESUR NEGATIVE 09/05/2022 1155    Radiological Exams on Admission: CT ANGIO GI  BLEED  Result Date: 12/08/2022 CLINICAL DATA:  GI bleed EXAM: CTA ABDOMEN AND PELVIS WITHOUT AND WITH CONTRAST TECHNIQUE: Multidetector CT imaging of the abdomen and pelvis was performed using the standard protocol during bolus administration of intravenous contrast. Multiplanar reconstructed images and MIPs were obtained and reviewed to evaluate the vascular anatomy. RADIATION DOSE REDUCTION: This exam was performed according to the departmental dose-optimization program which includes automated exposure control, adjustment of the mA and/or kV according to patient size and/or use of iterative reconstruction technique. CONTRAST:  75mL OMNIPAQUE IOHEXOL 350 MG/ML SOLN COMPARISON:  MRI abdomen/pelvis dated 11/10/2021 FINDINGS: Lower chest: 18 mm thin-walled lung cyst in the left lower lobe (series 7/image 19). Additional atelectasis/scarring in the bilateral lower lobes. Hepatobiliary: Nodular hepatic contour with enlargement of the left hepatic lobe, suggesting cirrhosis. No focal hepatic lesion is seen. Status post cholecystectomy. No intrahepatic or extrahepatic duct dilatation. Pancreas: Within normal limits. Spleen: Surgically absent. Adrenals/Urinary Tract: Adrenal glands are within normal limits. Malrotated right kidney. Bilateral renal parenchymal atrophy. Bilateral renal cysts, including a dominant 3.7 cm simple cyst in the posterior right kidney (series 10/image 87) and a dominant 2.7 cm  simple cyst in the lateral left upper kidney (series 10/image 39), benign (Bosniak I). No follow-up is recommended. Stomach/Bowel: Stomach is notable for a tiny hiatal hernia. No evidence of bowel obstruction. Suspected appendectomy. Extensive sigmoid diverticulosis, without evidence of diverticulitis. Vascular/Lymphatic: No spillage of intraluminal contrast following contrast administration to suggest active GI bleeding. No evidence of abdominal aortic aneurysm. Atherosclerotic calcifications of the abdominal aorta and  branch vessels. 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 Celiac artery, SMA, and IMA remain patent. No suspicious abdominopelvic lymphadenopathy. Reproductive: Uterus is grossly unremarkable. Bilateral ovaries are within normal limits. Other: No abdominopelvic ascites. Musculoskeletal: Degenerative changes of the visualized thoracolumbar spine. IMPRESSION: No evidence of active GI bleeding. Extensive sigmoid diverticulosis, without evidence of diverticulitis. Additional stable ancillary findings as above. Electronically Signed   By: Charline Bills M.D.   On: 12/08/2022 03:30    EKG: N/A  Assessment/Plan Principal Problem:   GI bleed Active Problems:   Acquired hypothyroidism   Essential hypertension   Rheumatoid arthritis (HCC)   Hyperlipidemia   COPD (chronic obstructive pulmonary disease) (HCC)   CKD (chronic kidney disease) stage 4, GFR 15-29 ml/min (HCC) - baseline SCr 2.5. follows with Dr. Malen Gauze with [facility]   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; now atretic and significantly improved LAD lesion without intervention   GERD (gastroesophageal reflux disease)   Chronic anticoagulation   Lower GI bleed   Bright red blood per rectum Extensive sigmoid diverticulosis Concerning for lower GI bleed, likely diverticular in the setting of chronic anticoagulation with Eliquis. CT angio of abdomen showed no acute bleeding, revealed extensive sigmoid diverticulosis. Plan. Holding Eliquis given ongoing GI bleed. Continue closely monitor hemoglobin, transfuse as needed for hemoglobin less than 7. Obtain GI evaluation in a.m. for additional recommendations.  Peptic ulcer disease Continue pantoprazole   Chronic kidney disease stage IV Anion gap metabolic acidosis Function essentially stable, with baseline creatinine ranging from 3.1-4.1 over the past several months. Continue closely monitor.  Add sodium bicarbonate supplementation   Coronary artery disease,  status post CABG Reports no active chest pain.  Continue Coreg, Lipitor.   Hypertensive urgency Blood pressure markedly elevated in the ED. Continue current regimen of carvedilol, losartan and clonidine patch. Patient was instructed by her PCP to use clonidine as needed for SBP greater than 200   Hx of DVT and PE  Holding Eliquis given ongoing lower GI bleed   Chronic obstructive pulmonary disease No evidence of acute exacerbation.  Continue as needed bronchodilators  Rheumatoid arthritis Continue daily leflunomide    Hypothyroidism  Continue levothyroxine  Anxiety and depression Continue Zoloft.  Mood is stable  DVT prophylaxis: SCD  Code Status:        DNR  Intubation OK  Family Communication:  Updated patient's husband   Disposition Plan:   Patient is from:  Home  Anticipated DC to:  Home  Anticipated DC date:  48-72 hrs   Anticipated DC barriers: None   Consults called:  Gastroenterology. Dr Milbert Coulter   Admission status:  Inpatient, MedSurg    Severity of Illness: The appropriate patient status for this patient is INPATIENT. Inpatient status is judged to be reasonable and necessary in order to provide the required intensity of service to ensure the patient's safety. The patient's presenting symptoms, physical exam findings, and initial radiographic and laboratory data in the context of their chronic comorbidities is felt to place them at high risk for further clinical deterioration. Furthermore, it is  not anticipated that the patient will be medically stable for discharge from the hospital within 2 midnights of admission.   * I certify that at the point of admission it is my clinical judgment that the patient will require inpatient hospital care spanning beyond 2 midnights from the point of admission due to high intensity of service, high risk for further deterioration and high frequency of surveillance required.Youlanda Roys MD [facility]  How to  contact the [facility] Attending or Consulting provider 7A - 7P or covering provider during after hours 7P -7A, for this patient?   Check the care team in [facility] and look for a) attending/consulting [facility] provider listed and b) the [facility] team listed Log into [email or web address] and use [facility] universal password to access. If you do not have the password, please contact the hospital operator. Locate the [facility] provider you are looking for under [facility] and page to a number that you can be directly reached. If you still have difficulty reaching the provider, please page the [facility] (Director on Call) for the Hospitalists listed on amion for assistance.  12/08/2022, 6:11 AM

## 2022-12-08 NOTE — Plan of Care (Signed)

## 2022-12-08 NOTE — ED Notes (Signed)
ED TO INPATIENT HANDOFF REPORT  ED Nurse Name and Phone #: Marchelle Folks RN    S Name/Age/Gender [name] 82 y.o. female Room/Bed: 011C/011C  Code Status   Code Status: DNR  Home/SNF/Other Home Patient oriented to: self, place, time, and situation Is this baseline? Yes   Triage Complete: Triage complete  Chief Complaint GI bleed [K92.2]  Triage Note Pt is coming in for rectal bleeding that started today, she was D/c'd 1 week ago from being admitted for the same, did not need a tranfusion at that time per her words. Currently only had two episodes today of rectal bleeding but describes it as bright red. She also mentions some feelings of being washed out but admits to chronically low hgb. No nausea, no chest pain, no other symptoms present at this time    Allergies Allergies  Allergen Reactions   Nsaids Other (See Comments)    Stomach upset Told to avoid due to Eliquis   Aspirin Other (See Comments)    Stomach upset Told to avoid due to Eliquis   Codeine Nausea And Vomiting   Nitrostat [Nitroglycerin] Other (See Comments)    Bradycardia. Drop in heart rate     Level of Care/Admitting Diagnosis ED Disposition     ED Disposition  Admit   Condition  --   Comment  Hospital Area: [facility] [[id number]]  Level of Care: Med-Surg [16]  May admit patient to [facility] or [facility] if equivalent level of care is available:: No  Covid Evaluation: Asymptomatic - no recent exposure (last 10 days) testing not required  Diagnosis: GI bleed [284132]  Admitting Physician: Chiquita Loth  Attending Physician: Randol Kern, DAWOOD S [[id number]]  Bed request comments: 5 WEST BED  Certification:: I certify this patient will need inpatient services for at least 2 midnights          B Medical/Surgery History Past Medical History:  Diagnosis Date   Abdominal aortic aneurysm (HCC)    REPAIRED IN 1996 BY [name]  AND HAS RECENTLY BEEN FOLLOWED BY DR VAN  TRIGHT   Acquired asplenia     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 Most likely scar. Mild subendocardial ischemia. EF 71% LOW RISK.    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 Kellie Simmering   Pulmonary emboli [facility]) 02/06/2022   01/2022 PE/DVT post-op. Lower GI bleed     Treat edema  Start  Eliquis at the reduced dose due to Creat>1.5 and age >50   Rheumatoid arthritis (HCC) 2011   Dr.Truslow; fracture knees, hands and wrists -    S/P CABG x 1 1996   CABG--LIMA-LAD for ostial LAD (not felt to be PCI amenable). EF NORMAL then; LIMA now atretic   Shortness of breath dyspnea    with exertion   Stroke (HCC) 11/2014   TIA    Urinary frequency    Past Surgical History:  Procedure Laterality Date   ABDOMINAL AORTIC ANEURYSM REPAIR  1996   Complicated by mesenteric artery stenosis and splenic artery infarction with acquired Asplenia   APPENDECTOMY     BIOPSY  07/08/2022   Procedure: BIOPSY;  Surgeon: Benancio Deeds, MD;  Location: [facility] ENDOSCOPY;  Service: Gastroenterology;;   BIOPSY  11/28/2022    Procedure: BIOPSY;  Surgeon: [name], MD;  Location: [facility] ENDOSCOPY;  Service: Gastroenterology;;   [name]  07/2011   right foot   CARDIAC CATHETERIZATION  2005   (Most recent CATH) - ostial LAD lesion 20-30% (down from 90% initially). Atretic LIMA. Minimal disease the RCA and Circumflex system.   CARPAL TUNNEL RELEASE Left    CATARACT EXTRACTION Bilateral    CERVICAL SPINE SURGERY     plate [id number] Dr. Wynetta Emery   COLONOSCOPY     COLONOSCOPY WITH PROPOFOL N/A 11/28/2022   Procedure: COLONOSCOPY WITH PROPOFOL;  Surgeon: [name], MD;  Location: [facility] ENDOSCOPY;  Service: Gastroenterology;  Laterality: N/A;   CORONARY ARTERY BYPASS GRAFT  1996   INCLUDED AN INTERNAL MAMMARY ARTERY TO THE LAD. EF WAS NORMAL   ESOPHAGOGASTRODUODENOSCOPY (EGD) WITH PROPOFOL N/A 07/08/2022   Procedure: ESOPHAGOGASTRODUODENOSCOPY (EGD) WITH PROPOFOL;  Surgeon: Benancio Deeds, MD;  Location: [facility] ENDOSCOPY;  Service: Gastroenterology;  Laterality: N/A;   ESOPHAGOGASTRODUODENOSCOPY (EGD) WITH PROPOFOL N/A 11/28/2022   Procedure: ESOPHAGOGASTRODUODENOSCOPY (EGD) WITH PROPOFOL;  Surgeon: [name], MD;  Location: [facility] ENDOSCOPY;  Service: Gastroenterology;  Laterality: N/A;   INGUINAL HERNIA REPAIR Right    LAPAROSCOPIC APPENDECTOMY N/A 06/28/2016   Procedure: APPENDECTOMY LAPAROSCOPIC;  Surgeon: [name], MD;  Location: [facility] ORS;  Service: General;  Laterality: N/A;   NM MYOVIEW LTD  06/2010   Fixed anteroseptal, apical and inferoapical defect with moderate size. Most likely scar. Mild subendocardial ischemia. EF 71% LOW RISK.    POLYPECTOMY  11/28/2022   Procedure: POLYPECTOMY;  Surgeon: [name], MD;  Location: [facility] ENDOSCOPY;  Service: Gastroenterology;;   SPLENECTOMY     TOTAL HIP ARTHROPLASTY Left 01/23/2022   Procedure: LEFT TOTAL HIP ARTHROPLASTY ANTERIOR APPROACH;  Surgeon: [name], MD;  Location: [facility] OR;  Service: Orthopedics;  Laterality: Left;  3-C   TRANSTHORACIC ECHOCARDIOGRAM   12/2014   [facility]: Normal LV size & function. EF 55-60%,    vagina polyp     VIDEO ASSISTED THORACOSCOPY (VATS)/WEDGE RESECTION Left 03/02/2015   Procedure: VIDEO ASSISTED THORACOSCOPY (VATS), MINI THORACOTOMY, LEFT UPPER LOBE WEDGE, TAKE DOWN OF INTERNAL MAMMARY LESIONS, PLACEMENT OF ON-Q PUMP;  Surgeon: [name], MD;  Location: [facility] OR;  Service: Thoracic;  Laterality: Left;   VIDEO BRONCHOSCOPY N/A 03/02/2015   Procedure: BRONCHOSCOPY;  Surgeon: [name], MD;  Location: [facility] OR;  Service: Thoracic;  Laterality: N/A;   VIDEO BRONCHOSCOPY WITH ENDOBRONCHIAL NAVIGATION N/A 10/08/2017   Procedure: VIDEO BRONCHOSCOPY WITH ENDOBRONCHIAL NAVIGATION WITH BIOPSIES OF LEFT UPPER LOBE AND LEFT LOWER LOBE;  Surgeon: [name], MD;  Location: [facility] OR;  Service: Thoracic;  Laterality: N/A;     A IV Location/Drains/Wounds Patient Lines/Drains/Airways Status     Active Line/Drains/Airways     Name Placement date Placement time Site Days   Peripheral IV 12/08/22 18 G 2.5" Anterior;Proximal;Right;Upper Arm 12/08/22  0236  Arm  less than 1   Wound / Incision (Open or Dehisced) 02/17/22 Irritant Dermatitis (Moisture Associated Skin Damage) Perineum Bilateral 02/17/22  0130  Perineum  294            Intake/Output Last 24 hours No intake or output data in the 24 hours ending 12/08/22 1009  Labs/Imaging Results for orders placed or performed during the hospital encounter of 12/07/22 (from the past 48 hour(s))  Comprehensive metabolic panel     Status: Abnormal   Collection Time: 12/07/22  9:40 PM  Result Value Ref Range   Sodium 136 135 - 145 mmol/L   Potassium 5.2 (H) 3.5 - 5.1 mmol/L    Comment: HEMOLYSIS AT THIS LEVEL MAY AFFECT RESULT   Chloride 104 98 - 111 mmol/L   CO2 15 (L) 22 - 32 mmol/L   Glucose, Bld 141 (H) 70 - 99 mg/dL    Comment: Glucose reference range applies only to samples taken after fasting for at least 8 hours.   BUN 79 (H) 8 - 23 mg/dL    Creatinine, Ser 1.61 (H) 0.44 - 1.00 mg/dL   Calcium 9.1 8.9 - 09.6 mg/dL   Total Protein 6.1 (L) 6.5 - 8.1 g/dL   Albumin 3.2 (L) 3.5 - 5.0 g/dL   AST 29 15 - 41 U/L    Comment: HEMOLYSIS AT THIS LEVEL MAY AFFECT RESULT   ALT 17 0 - 44 U/L    Comment: HEMOLYSIS AT THIS LEVEL MAY AFFECT RESULT   Alkaline Phosphatase 85 38 - 126 U/L   Total Bilirubin 1.2 0.3 - 1.2 mg/dL    Comment: HEMOLYSIS AT THIS LEVEL MAY AFFECT RESULT   GFR, Estimated 12 (L) >60 mL/min    Comment: (NOTE) Calculated using the CKD-EPI Creatinine Equation (2021)    Anion gap 17 (H) 5 - 15    Comment: Performed at [facility], [address], [place], [place] [address]  CBC     Status: Abnormal   Collection Time: 12/07/22  9:40 PM  Result Value Ref Range   WBC 9.3 4.0 - 10.5 K/uL   RBC 2.76 (L) 3.87 - 5.11 MIL/uL   Hemoglobin 8.5 (L) 12.0 - 15.0 g/dL   HCT 98.1 (L) 19.1 - 47.8 %   MCV 99.3 80.0 - 100.0 fL   MCH 30.8 26.0 - 34.0 pg   MCHC 31.0 30.0 - 36.0 g/dL   RDW 29.5 (H) 62.1 - 30.8 %   Platelets 326 150 - 400 K/uL   nRBC 0.3 (H) 0.0 - 0.2 %    Comment: Performed at [facility], [address], [place], [place] [address]  Type and screen [facility]     Status: None   Collection Time: 12/07/22  9:53 PM  Result Value Ref Range   ABO/RH(D) O POS    Antibody Screen NEG    Sample Expiration      12/10/2022,2359 Performed at [facility], [address], [place], [place] [address]   POC occult blood, ED     Status: None   Collection Time: 12/07/22 10:53 PM  Result Value Ref Range   Fecal Occult Bld NEGATIVE NEGATIVE  CBC  Status: Abnormal   Collection Time: 12/08/22  4:38 AM  Result Value Ref Range   WBC 11.9 (H) 4.0 - 10.5 K/uL   RBC 2.49 (L) 3.87 - 5.11 MIL/uL   Hemoglobin 7.8 (L) 12.0 - 15.0 g/dL   HCT 16.1 (L) 09.6 - 04.5 %   MCV 97.6 80.0 - 100.0 fL   MCH 31.3 26.0 - 34.0 pg   MCHC 32.1 30.0 - 36.0 g/dL   RDW 40.9 (H) 81.1 - 91.4 %   Platelets 320 150 -  400 K/uL   nRBC 0.3 (H) 0.0 - 0.2 %    Comment: Performed at [facility], [address], [place], [place] [address]  Basic metabolic panel     Status: Abnormal   Collection Time: 12/08/22  8:15 AM  Result Value Ref Range   Sodium 136 135 - 145 mmol/L   Potassium 4.1 3.5 - 5.1 mmol/L   Chloride 104 98 - 111 mmol/L   CO2 22 22 - 32 mmol/L   Glucose, Bld 110 (H) 70 - 99 mg/dL    Comment: Glucose reference range applies only to samples taken after fasting for at least 8 hours.   BUN 69 (H) 8 - 23 mg/dL   Creatinine, Ser 6.21 (H) 0.44 - 1.00 mg/dL   Calcium 9.2 8.9 - 30.8 mg/dL   GFR, Estimated 12 (L) >60 mL/min    Comment: (NOTE) Calculated using the CKD-EPI Creatinine Equation (2021)    Anion gap 10 5 - 15    Comment: Performed at [facility], [address], [place], [place] [address]  CBC     Status: Abnormal   Collection Time: 12/08/22  9:16 AM  Result Value Ref Range   WBC 11.2 (H) 4.0 - 10.5 K/uL   RBC 2.57 (L) 3.87 - 5.11 MIL/uL   Hemoglobin 8.0 (L) 12.0 - 15.0 g/dL   HCT 69.6 (L) 29.5 - 28.4 %   MCV 100.8 (H) 80.0 - 100.0 fL   MCH 31.1 26.0 - 34.0 pg   MCHC 30.9 30.0 - 36.0 g/dL   RDW 13.2 (H) 44.0 - 10.2 %   Platelets 298 150 - 400 K/uL   nRBC 0.4 (H) 0.0 - 0.2 %    Comment: Performed at [facility], [address] [place], [place] [address]   CT ANGIO GI BLEED  Result Date: 12/08/2022 CLINICAL DATA:  GI bleed EXAM: CTA ABDOMEN AND PELVIS WITHOUT AND WITH CONTRAST TECHNIQUE: Multidetector CT imaging of the abdomen and pelvis was performed using the standard protocol during bolus administration of intravenous contrast. Multiplanar reconstructed images and MIPs were obtained and reviewed to evaluate the vascular anatomy. RADIATION DOSE REDUCTION: This exam was performed according to the departmental dose-optimization program which includes automated exposure control, adjustment of the mA and/or kV according to patient size and/or use of iterative  reconstruction technique. CONTRAST:  75mL OMNIPAQUE IOHEXOL 350 MG/ML SOLN COMPARISON:  MRI abdomen/pelvis dated 11/10/2021 FINDINGS: Lower chest: 18 mm thin-walled lung cyst in the left lower lobe (series 7/image 19). Additional atelectasis/scarring in the bilateral lower lobes. Hepatobiliary: Nodular hepatic contour with enlargement of the left hepatic lobe, suggesting cirrhosis. No focal hepatic lesion is seen. Status post cholecystectomy. No intrahepatic or extrahepatic duct dilatation. Pancreas: Within normal limits. Spleen: Surgically absent. Adrenals/Urinary Tract: Adrenal glands are within normal limits. Malrotated right kidney. Bilateral renal parenchymal atrophy. Bilateral renal cysts, including a dominant 3.7 cm simple cyst in the posterior right kidney (series 10/image 87)  and a dominant 2.7 cm simple cyst in the lateral left upper kidney (series 10/image 39), benign (Bosniak I). No follow-up is recommended. Stomach/Bowel: Stomach is notable for a tiny hiatal hernia. No evidence of bowel obstruction. Suspected appendectomy. Extensive sigmoid diverticulosis, without evidence of diverticulitis. Vascular/Lymphatic: No spillage of intraluminal contrast following contrast administration to suggest active GI bleeding. No evidence of abdominal aortic aneurysm. Atherosclerotic calcifications of the abdominal aorta and branch vessels. 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 Celiac artery, SMA, and IMA remain patent. No suspicious abdominopelvic lymphadenopathy. Reproductive: Uterus is grossly unremarkable. Bilateral ovaries are within normal limits. Other: No abdominopelvic ascites. Musculoskeletal: Degenerative changes of the visualized thoracolumbar spine. IMPRESSION: No evidence of active GI bleeding. Extensive sigmoid diverticulosis, without evidence of diverticulitis. Additional stable ancillary findings as above. Electronically Signed   By: Charline Bills M.D.   On: 12/08/2022 03:30     Pending Labs Unresulted Labs (From admission, onward)     Start     Ordered   12/09/22 0500  CBC  Tomorrow morning,   R        12/08/22 0539   12/08/22 1000  Basic metabolic panel  Once-Timed,   TIMED        12/08/22 0734   12/08/22 1000  CBC  Now then every 6 hours,   R (with TIMED occurrences)      12/08/22 0734            Vitals/Pain Today's Vitals   12/08/22 0638 12/08/22 0658 12/08/22 0718 12/08/22 0752  BP: (!) 225/83 (!) 224/99 (!) 220/83 (!) 209/82  Pulse:    93  Resp:      Temp:      TempSrc:      SpO2:      Weight:      Height:      PainSc:        Isolation Precautions No active isolations  Medications Medications  carvedilol (COREG) tablet 3.125 mg (3.125 mg Oral Given 12/08/22 0752)  leflunomide (ARAVA) tablet 10 mg (has no administration in time range)  atorvastatin (LIPITOR) tablet 80 mg (80 mg Oral Given 12/08/22 0911)  cloNIDine (CATAPRES) tablet 0.1 mg (0.1 mg Oral Given 12/08/22 0609)  cloNIDine (CATAPRES - Dosed in mg/24 hr) patch 0.2 mg (has no administration in time range)  sertraline (ZOLOFT) tablet 25 mg (25 mg Oral Given 12/08/22 0911)  levothyroxine (SYNTHROID) tablet 88 mcg (88 mcg Oral Given 12/08/22 0752)  pantoprazole (PROTONIX) EC tablet 40 mg (40 mg Oral Given 12/08/22 0911)  acetaminophen (TYLENOL) tablet 650 mg (has no administration in time range)    Or  acetaminophen (TYLENOL) suppository 650 mg (has no administration in time range)  ondansetron (ZOFRAN) tablet 4 mg (has no administration in time range)    Or  ondansetron (ZOFRAN) injection 4 mg (has no administration in time range)  albuterol (PROVENTIL) (2.5 MG/3ML) 0.083% nebulizer solution 2.5 mg (has no administration in time range)  hydrALAZINE (APRESOLINE) injection 10 mg (has no administration in time range)  amLODipine (NORVASC) tablet 10 mg (10 mg Oral Given 12/08/22 0752)  sodium zirconium cyclosilicate (LOKELMA) packet 5 g (5 g Oral Given 12/08/22 0056)  iohexol (OMNIPAQUE) 350  MG/ML injection 75 mL (75 mLs Intravenous Contrast Given 12/08/22 0301)    Mobility walks with device     Focused Assessments Pt is passing large amounts of red blood in stool    R Recommendations: See Admitting Provider Note  Report given to:  Additional Notes: pt ambulates to the bathroom with walker.

## 2022-12-08 NOTE — ED Notes (Addendum)
ED TO INPATIENT HANDOFF REPORT  ED Nurse Name and Phone #:   S Name/Age/Gender [name] 82 y.o. female Room/Bed: 038C/038C  Code Status   Code Status: DNR  Home/SNF/Other Home Patient oriented to: self, place, time, and situation Is this baseline? Yes   Triage Complete: Triage complete  Chief Complaint GI bleed [K92.2]  Triage Note Pt is coming in for rectal bleeding that started today, she was D/c'd 1 week ago from being admitted for the same, did not need a tranfusion at that time per her words. Currently only had two episodes today of rectal bleeding but describes it as bright red. She also mentions some feelings of being washed out but admits to chronically low hgb. No nausea, no chest pain, no other symptoms present at this time    Allergies Allergies  Allergen Reactions   Nsaids Other (See Comments)    Stomach upset Told to avoid due to Eliquis   Aspirin Other (See Comments)    Stomach upset Told to avoid due to Eliquis   Codeine Nausea And Vomiting   Nitrostat [Nitroglycerin] Other (See Comments)    Bradycardia. Drop in heart rate     Level of Care/Admitting Diagnosis ED Disposition     ED Disposition  Admit   Condition  --   Comment  Hospital Area: [facility] [[id number]]  Level of Care: Med-Surg [16]  May admit patient to [facility] or [facility] if equivalent level of care is available:: No  Covid Evaluation: Asymptomatic - no recent exposure (last 10 days) testing not required  Diagnosis: GI bleed [811914]  Admitting Physician: Chiquita Loth  Attending Physician: Randol Kern, DAWOOD S [[id number]]  Bed request comments: 5 WEST BED  Certification:: I certify this patient will need inpatient services for at least 2 midnights          B Medical/Surgery History Past Medical History:  Diagnosis Date   Abdominal aortic aneurysm (HCC)    REPAIRED IN 1996 BY [name]  AND HAS RECENTLY BEEN FOLLOWED BY [name]    Acquired asplenia     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 Most likely scar. Mild subendocardial ischemia. EF 71% LOW RISK.    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 Kellie Simmering   Pulmonary emboli [facility]) 02/06/2022   01/2022 PE/DVT post-op. Lower GI bleed     Treat edema  Start Eliquis at the  reduced dose due to Creat>1.5 and age >65   Rheumatoid arthritis (HCC) 2011   Dr.Truslow; fracture knees, hands and wrists -    S/P CABG x 1 1996   CABG--LIMA-LAD for ostial LAD (not felt to be PCI amenable). EF NORMAL then; LIMA now atretic   Shortness of breath dyspnea    with exertion   Stroke (HCC) 11/2014   TIA    Urinary frequency    Past Surgical History:  Procedure Laterality Date   ABDOMINAL AORTIC ANEURYSM REPAIR  1996   Complicated by mesenteric artery stenosis and splenic artery infarction with acquired Asplenia   APPENDECTOMY     BIOPSY  07/08/2022   Procedure: BIOPSY;  Surgeon: Benancio Deeds, MD;  Location: [facility] ENDOSCOPY;  Service: Gastroenterology;;   BIOPSY  11/28/2022   Procedure:  BIOPSY;  Surgeon: [name], MD;  Location: [facility] ENDOSCOPY;  Service: Gastroenterology;;   [name]  07/2011   right foot   CARDIAC CATHETERIZATION  2005   (Most recent CATH) - ostial LAD lesion 20-30% (down from 90% initially). Atretic LIMA. Minimal disease the RCA and Circumflex system.   CARPAL TUNNEL RELEASE Left    CATARACT EXTRACTION Bilateral    CERVICAL SPINE SURGERY     plate 1610 Dr. Wynetta Emery   COLONOSCOPY     COLONOSCOPY WITH PROPOFOL N/A 11/28/2022   Procedure: COLONOSCOPY WITH PROPOFOL;  Surgeon: [name], MD;  Location: [facility] ENDOSCOPY;  Service: Gastroenterology;  Laterality: N/A;   CORONARY ARTERY BYPASS GRAFT  1996   INCLUDED AN INTERNAL MAMMARY ARTERY TO THE LAD. EF WAS NORMAL   ESOPHAGOGASTRODUODENOSCOPY (EGD) WITH PROPOFOL N/A 07/08/2022   Procedure: ESOPHAGOGASTRODUODENOSCOPY (EGD) WITH PROPOFOL;  Surgeon: Benancio Deeds, MD;  Location: [facility] ENDOSCOPY;  Service: Gastroenterology;  Laterality: N/A;   ESOPHAGOGASTRODUODENOSCOPY (EGD) WITH PROPOFOL N/A 11/28/2022   Procedure: ESOPHAGOGASTRODUODENOSCOPY (EGD) WITH PROPOFOL;  Surgeon: [name], MD;  Location: [facility] ENDOSCOPY;  Service: Gastroenterology;  Laterality: N/A;   INGUINAL HERNIA REPAIR Right    LAPAROSCOPIC APPENDECTOMY N/A 06/28/2016   Procedure: APPENDECTOMY LAPAROSCOPIC;  Surgeon: [name], MD;  Location: [facility] ORS;  Service: General;  Laterality: N/A;   NM MYOVIEW LTD  06/2010   Fixed anteroseptal, apical and inferoapical defect with moderate size. Most likely scar. Mild subendocardial ischemia. EF 71% LOW RISK.    POLYPECTOMY  11/28/2022   Procedure: POLYPECTOMY;  Surgeon: [name], MD;  Location: [facility] ENDOSCOPY;  Service: Gastroenterology;;   SPLENECTOMY     TOTAL HIP ARTHROPLASTY Left 01/23/2022   Procedure: LEFT TOTAL HIP ARTHROPLASTY ANTERIOR APPROACH;  Surgeon: [name], MD;  Location: [facility] OR;  Service: Orthopedics;  Laterality: Left;  3-C   TRANSTHORACIC ECHOCARDIOGRAM  12/2014   [facility]: Normal LV size & function. EF 55-60%,    vagina polyp     VIDEO ASSISTED THORACOSCOPY (VATS)/WEDGE RESECTION Left 03/02/2015   Procedure: VIDEO ASSISTED THORACOSCOPY (VATS), MINI THORACOTOMY, LEFT UPPER LOBE WEDGE, TAKE DOWN OF INTERNAL MAMMARY LESIONS, PLACEMENT OF ON-Q PUMP;  Surgeon: [name], MD;  Location: [facility] OR;  Service: Thoracic;  Laterality: Left;   VIDEO BRONCHOSCOPY N/A 03/02/2015   Procedure: BRONCHOSCOPY;  Surgeon: [name], MD;  Location: [facility] OR;  Service: Thoracic;  Laterality: N/A;   VIDEO BRONCHOSCOPY WITH ENDOBRONCHIAL NAVIGATION N/A 10/08/2017   Procedure: VIDEO BRONCHOSCOPY WITH ENDOBRONCHIAL NAVIGATION WITH BIOPSIES OF LEFT UPPER LOBE AND LEFT LOWER LOBE;  Surgeon: [name], MD;  Location: [facility] OR;  Service: Thoracic;  Laterality: N/A;     A IV Location/Drains/Wounds Patient Lines/Drains/Airways Status     Active Line/Drains/Airways     Name Placement date Placement time Site Days   Peripheral IV 12/08/22 18 G 2.5" Anterior;Proximal;Right;Upper Arm 12/08/22  0236  Arm  less than 1   Wound / Incision (Open or Dehisced) 02/17/22 Irritant Dermatitis (Moisture Associated Skin Damage) Perineum Bilateral 02/17/22  0130  Perineum  294            Intake/Output Last 24 hours No intake or output data in the 24 hours ending 12/08/22 1248  Labs/Imaging Results for orders placed or performed during the hospital encounter of 12/07/22 (from the past 48 hour(s))  Comprehensive metabolic panel     Status: Abnormal   Collection Time: 12/07/22  9:40 PM  Result Value Ref Range   Sodium 136 135 - 145 mmol/L   Potassium 5.2 (H) 3.5 - 5.1 mmol/L    Comment: HEMOLYSIS AT THIS LEVEL MAY AFFECT RESULT   Chloride 104 98 - 111 mmol/L   CO2 15 (L) 22 - 32 mmol/L   Glucose, Bld 141 (H) 70 - 99 mg/dL    Comment: Glucose reference range applies only to samples taken after fasting for at least 8 hours.   BUN 79 (H) 8 - 23 mg/dL   Creatinine,  Ser 1.61 (H) 0.44 - 1.00 mg/dL   Calcium 9.1 8.9 - 09.6 mg/dL   Total Protein 6.1 (L) 6.5 - 8.1 g/dL   Albumin 3.2 (L) 3.5 - 5.0 g/dL   AST 29 15 - 41 U/L    Comment: HEMOLYSIS AT THIS LEVEL MAY AFFECT RESULT   ALT 17 0 - 44 U/L    Comment: HEMOLYSIS AT THIS LEVEL MAY AFFECT RESULT   Alkaline Phosphatase 85 38 - 126 U/L   Total Bilirubin 1.2 0.3 - 1.2 mg/dL    Comment: HEMOLYSIS AT THIS LEVEL MAY AFFECT RESULT   GFR, Estimated 12 (L) >60 mL/min    Comment: (NOTE) Calculated using the CKD-EPI Creatinine Equation (2021)    Anion gap 17 (H) 5 - 15    Comment: Performed at [facility], [address] [place], [place] [address]  CBC     Status: Abnormal   Collection Time: 12/07/22  9:40 PM  Result Value Ref Range   WBC 9.3 4.0 - 10.5 K/uL   RBC 2.76 (L) 3.87 - 5.11 MIL/uL   Hemoglobin 8.5 (L) 12.0 - 15.0 g/dL   HCT 98.1 (L) 19.1 - 47.8 %   MCV 99.3 80.0 - 100.0 fL   MCH 30.8 26.0 - 34.0 pg   MCHC 31.0 30.0 - 36.0 g/dL   RDW 29.5 (H) 62.1 - 30.8 %   Platelets 326 150 - 400 K/uL   nRBC 0.3 (H) 0.0 - 0.2 %    Comment: Performed at [facility], [address], [place], [place] [address]  Type and screen [facility]     Status: None   Collection Time: 12/07/22  9:53 PM  Result Value Ref Range   ABO/RH(D) O POS    Antibody Screen NEG    Sample Expiration      12/10/2022,2359 Performed at [facility], [address], [place], [place] [address]   POC occult blood, ED     Status: None   Collection Time: 12/07/22 10:53 PM  Result Value Ref Range   Fecal Occult Bld NEGATIVE NEGATIVE  CBC     Status: Abnormal  Collection Time: 12/08/22  4:38 AM  Result Value Ref Range   WBC 11.9 (H) 4.0 - 10.5 K/uL   RBC 2.49 (L) 3.87 - 5.11 MIL/uL   Hemoglobin 7.8 (L) 12.0 - 15.0 g/dL   HCT 82.9 (L) 56.2 - 13.0 %   MCV 97.6 80.0 - 100.0 fL   MCH 31.3 26.0 - 34.0 pg   MCHC 32.1 30.0 - 36.0 g/dL   RDW 86.5 (H) 78.4 - 69.6 %   Platelets 320 150 - 400 K/uL    nRBC 0.3 (H) 0.0 - 0.2 %    Comment: Performed at [facility], [address], [place], [place] [address]  Basic metabolic panel     Status: Abnormal   Collection Time: 12/08/22  8:15 AM  Result Value Ref Range   Sodium 136 135 - 145 mmol/L   Potassium 4.1 3.5 - 5.1 mmol/L   Chloride 104 98 - 111 mmol/L   CO2 22 22 - 32 mmol/L   Glucose, Bld 110 (H) 70 - 99 mg/dL    Comment: Glucose reference range applies only to samples taken after fasting for at least 8 hours.   BUN 69 (H) 8 - 23 mg/dL   Creatinine, Ser 4.13 (H) 0.44 - 1.00 mg/dL   Calcium 9.2 8.9 - 24.4 mg/dL   GFR, Estimated 12 (L) >60 mL/min    Comment: (NOTE) Calculated using the CKD-EPI Creatinine Equation (2021)    Anion gap 10 5 - 15    Comment: Performed at [facility], [address], [place], [place] [address]  CBC     Status: Abnormal   Collection Time: 12/08/22  9:16 AM  Result Value Ref Range   WBC 11.2 (H) 4.0 - 10.5 K/uL   RBC 2.57 (L) 3.87 - 5.11 MIL/uL   Hemoglobin 8.0 (L) 12.0 - 15.0 g/dL   HCT 25.3 (L) 66.4 - 40.3 %   MCV 100.8 (H) 80.0 - 100.0 fL   MCH 31.1 26.0 - 34.0 pg   MCHC 30.9 30.0 - 36.0 g/dL   RDW 47.4 (H) 25.9 - 56.3 %   Platelets 298 150 - 400 K/uL   nRBC 0.4 (H) 0.0 - 0.2 %    Comment: Performed at [facility], [address] [place], [place] [address]   CT ANGIO GI BLEED  Result Date: 12/08/2022 CLINICAL DATA:  GI bleed EXAM: CTA ABDOMEN AND PELVIS WITHOUT AND WITH CONTRAST TECHNIQUE: Multidetector CT imaging of the abdomen and pelvis was performed using the standard protocol during bolus administration of intravenous contrast. Multiplanar reconstructed images and MIPs were obtained and reviewed to evaluate the vascular anatomy. RADIATION DOSE REDUCTION: This exam was performed according to the departmental dose-optimization program which includes automated exposure control, adjustment of the mA and/or kV according to patient size and/or use of iterative reconstruction  technique. CONTRAST:  75mL OMNIPAQUE IOHEXOL 350 MG/ML SOLN COMPARISON:  MRI abdomen/pelvis dated 11/10/2021 FINDINGS: Lower chest: 18 mm thin-walled lung cyst in the left lower lobe (series 7/image 19). Additional atelectasis/scarring in the bilateral lower lobes. Hepatobiliary: Nodular hepatic contour with enlargement of the left hepatic lobe, suggesting cirrhosis. No focal hepatic lesion is seen. Status post cholecystectomy. No intrahepatic or extrahepatic duct dilatation. Pancreas: Within normal limits. Spleen: Surgically absent. Adrenals/Urinary Tract: Adrenal glands are within normal limits. Malrotated right kidney. Bilateral renal parenchymal atrophy. Bilateral renal cysts, including a dominant 3.7 cm simple cyst in the posterior right kidney (series 10/image 87) and a dominant 2.7  cm simple cyst in the lateral left upper kidney (series 10/image 39), benign (Bosniak I). No follow-up is recommended. Stomach/Bowel: Stomach is notable for a tiny hiatal hernia. No evidence of bowel obstruction. Suspected appendectomy. Extensive sigmoid diverticulosis, without evidence of diverticulitis. Vascular/Lymphatic: No spillage of intraluminal contrast following contrast administration to suggest active GI bleeding. No evidence of abdominal aortic aneurysm. Atherosclerotic calcifications of the abdominal aorta and branch vessels. 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 Celiac artery, SMA, and IMA remain patent. No suspicious abdominopelvic lymphadenopathy. Reproductive: Uterus is grossly unremarkable. Bilateral ovaries are within normal limits. Other: No abdominopelvic ascites. Musculoskeletal: Degenerative changes of the visualized thoracolumbar spine. IMPRESSION: No evidence of active GI bleeding. Extensive sigmoid diverticulosis, without evidence of diverticulitis. Additional stable ancillary findings as above. Electronically Signed   By: Charline Bills M.D.   On: 12/08/2022 03:30    Pending  Labs Unresulted Labs (From admission, onward)     Start     Ordered   12/09/22 0500  CBC  Tomorrow morning,   R        12/08/22 0539   12/08/22 1140  Basic metabolic panel  Once,   STAT        12/08/22 1140   12/08/22 1000  CBC  Now then every 6 hours,   R      12/08/22 0734            Vitals/Pain Today's Vitals   12/08/22 0658 12/08/22 0718 12/08/22 0752 12/08/22 1027  BP: (!) 224/99 (!) 220/83 (!) 209/82   Pulse:   93   Resp:      Temp:    97.6 F (36.4 C)  TempSrc:    Oral  SpO2:      Weight:      Height:      PainSc:        Isolation Precautions No active isolations  Medications Medications  carvedilol (COREG) tablet 3.125 mg (3.125 mg Oral Given 12/08/22 0752)  leflunomide (ARAVA) tablet 10 mg (10 mg Oral Given 12/08/22 1027)  atorvastatin (LIPITOR) tablet 80 mg (80 mg Oral Given 12/08/22 0911)  cloNIDine (CATAPRES) tablet 0.1 mg (0.1 mg Oral Given 12/08/22 0609)  cloNIDine (CATAPRES - Dosed in mg/24 hr) patch 0.2 mg (has no administration in time range)  sertraline (ZOLOFT) tablet 25 mg (25 mg Oral Given 12/08/22 0911)  levothyroxine (SYNTHROID) tablet 88 mcg (88 mcg Oral Given 12/08/22 0752)  pantoprazole (PROTONIX) EC tablet 40 mg (40 mg Oral Given 12/08/22 0911)  acetaminophen (TYLENOL) tablet 650 mg (has no administration in time range)    Or  acetaminophen (TYLENOL) suppository 650 mg (has no administration in time range)  ondansetron (ZOFRAN) tablet 4 mg (has no administration in time range)    Or  ondansetron (ZOFRAN) injection 4 mg (has no administration in time range)  albuterol (PROVENTIL) (2.5 MG/3ML) 0.083% nebulizer solution 2.5 mg (has no administration in time range)  hydrALAZINE (APRESOLINE) injection 10 mg (has no administration in time range)  amLODipine (NORVASC) tablet 10 mg (10 mg Oral Given 12/08/22 0752)  hydrocortisone (ANUSOL-HC) suppository 25 mg (has no administration in time range)  psyllium (HYDROCIL/METAMUCIL) 1 packet (has no administration  in time range)  sodium zirconium cyclosilicate (LOKELMA) packet 5 g (5 g Oral Given 12/08/22 0056)  iohexol (OMNIPAQUE) 350 MG/ML injection 75 mL (75 mLs Intravenous Contrast Given 12/08/22 0301)    Mobility walks     Focused Assessments Cardiac Assessment Handoff:    Lab Results  Component Value Date   CKTOTAL 40 12/26/2019   Lab Results  Component Value Date   DDIMER 0.61 (H) 01/22/2019   Does the Patient currently have chest pain? No    R Recommendations: See Admitting Provider Note  Report given to:   Additional Notes: did a rectal wit GI he believes the bleeding is coming from the Hemorrhoids she walks well we just stood beside her Prn BP meds if greater then 180 the last BP was after walking so I will reassess and give as needed prior to bringing her her other meds aren't verified yet

## 2022-12-08 NOTE — Progress Notes (Signed)
Pt home settings unknown. Set-up on Auto BiPAP per protocol. Settings per pt comfort and monitored for apnea. Snoring improved when CPAP self- adjusted to 8.3cmH2O @this  time. Pt recently changed to nasal pillows @home  so set up w/nasal mask as pillows are unavailable.   12/08/22 2348  BiPAP/CPAP/SIPAP  $ Non-Invasive Home Ventilator  Initial  $ Face Mask Small Yes (Nasal)  BiPAP/CPAP/SIPAP Pt Type Adult  BiPAP/CPAP/SIPAP Resmed  Mask Type Nasal mask  Mask Size Small  IPAP  (12 max)  EPAP  (6 min)  Pressure Support 2 cmH20  Flow Rate 2 lpm  Auto Titrate Yes  BiPAP/CPAP /SiPAP Vitals  Pulse Rate 87  Resp (!) 26  SpO2 94 %

## 2022-12-08 NOTE — Progress Notes (Signed)
Paged on call Dr. Virgel Manifold to request order for CPAP. Pt uses at home for bedtime. Awaiting call back at this time. Notified oncoming shift.

## 2022-12-08 NOTE — ED Notes (Signed)
[facility] inpatient RN ready for patient

## 2022-12-08 NOTE — Plan of Care (Signed)
  Problem: Nutrition: Goal: Adequate nutrition will be maintained Outcome: Progressing   Problem: Safety: Goal: Ability to remain free from injury will improve Outcome: Progressing   Problem: Clinical Measurements: Goal: Ability to maintain clinical measurements within normal limits will improve Outcome: Progressing   Problem: Education: Goal: Knowledge of General Education information will improve Description: Including pain rating scale, medication(s)/side effects and non-pharmacologic comfort measures Outcome: Progressing

## 2022-12-09 ENCOUNTER — Inpatient Hospital Stay (HOSPITAL_COMMUNITY): Payer: Medicare PPO

## 2022-12-09 DIAGNOSIS — K625 Hemorrhage of anus and rectum: Secondary | ICD-10-CM

## 2022-12-09 DIAGNOSIS — K922 Gastrointestinal hemorrhage, unspecified: Secondary | ICD-10-CM | POA: Diagnosis not present

## 2022-12-09 DIAGNOSIS — Z86711 Personal history of pulmonary embolism: Secondary | ICD-10-CM | POA: Diagnosis not present

## 2022-12-09 LAB — CBC
HCT: 24.2 % — ABNORMAL LOW (ref 36.0–46.0)
Hemoglobin: 7.7 g/dL — ABNORMAL LOW (ref 12.0–15.0)
MCH: 32 pg (ref 26.0–34.0)
MCHC: 31.8 g/dL (ref 30.0–36.0)
MCV: 100.4 fL — ABNORMAL HIGH (ref 80.0–100.0)
Platelets: 319 K/uL (ref 150–400)
RBC: 2.41 MIL/uL — ABNORMAL LOW (ref 3.87–5.11)
RDW: 16.9 % — ABNORMAL HIGH (ref 11.5–15.5)
WBC: 10.5 K/uL (ref 4.0–10.5)
nRBC: 0.7 % — ABNORMAL HIGH (ref 0.0–0.2)

## 2022-12-09 MED ORDER — LOSARTAN POTASSIUM 50 MG PO TABS: 50.0000 mg | ORAL_TABLET | Freq: Every day | ORAL | Status: DC

## 2022-12-09 NOTE — Plan of Care (Signed)

## 2022-12-09 NOTE — Progress Notes (Signed)
   Patient Name: [name] Date of Encounter: 12/09/2022, 10:41 AM     Assessment and Plan  Rectal bleeding - think due to hemorrhoids  Hx DVT/PE on Eliquis (held)  ------------------------------------------------------------------------------------------------------  Hgb up and down but not worse than lowest  Agree w/ LE vascular US - hopefully does not need Eliquis anymore  ASA likely ok, per TRH she has hx Plavixx after CABG in past - would not start that and sort out as outpt   Subjective  No bleeding   Objective  BP (!) 194/73 (BP Location: Left Arm)   Pulse 90   Temp 98 F (36.7 C) (Oral)   Resp 18   Ht 5\' 3"  (1.6 m)   Wt 58.1 kg   SpO2 96%   BMI 22.67 kg/m  NAD  Recent Labs  Lab 12/08/22 0916 12/08/22 1806 12/09/22 0150  HGB 8.0* 9.0* 7.7*  HCT 25.9* 28.9* 24.2*  WBC 11.2* 11.1* 10.5  PLT 298 344 319        Iva Boop, MD, [facility] Gastroenterology See AMION on call - gastroenterology for best contact person 12/09/2022 10:41 AM

## 2022-12-09 NOTE — Progress Notes (Signed)
   12/09/22 2217  BiPAP/CPAP/SIPAP  Reason BIPAP/CPAP not in use Non-compliant (Refused)  BiPAP/CPAP /SiPAP Vitals  SpO2 92 %   Machine at bedside, states she will wear nasal cannula instead.

## 2022-12-09 NOTE — Progress Notes (Signed)
Progress Note    [name]  ZOX:[id number] DOB: August 26, 1940  DOA: 12/07/2022 PCP: [name], MD      Brief Narrative:    Medical records reviewed and are as summarized below:  [name] is a 82 y.o. female ILYSSA [name] is a 82 y.o. 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  Patient was hospitalized for similar episodes from 7/28 to 7/31 at [facility], we she underwent EGD and colonoscopy on 11/28/22. EGD showed mild gastritis, salmon colored mucosa in esophagus that was biopsied. prior gastric ulcers that have healed. Colonoscopy showed 3 small polyps that were removed, diverticulosis, hemorrhoids. It was suspected that her bleeding is likely due to a diverticular bleed.  On 7/29 patient had as well tagged RBC scan that was negative for source of GI bleeding. -Patient was discharged back on Eliquis, she presents with bright red blood per rectum, CT angio GI bleed, showed no evidence of active GI bleeding, extensive sigmoid diverticulosis, without evidence of diverticulitis.        Assessment/Plan:   Principal Problem:   GI bleed Active Problems:   Acquired hypothyroidism   Essential hypertension   Rheumatoid arthritis (HCC)   Hyperlipidemia   COPD (chronic obstructive pulmonary disease) (HCC)   CKD (chronic kidney disease) stage 4, GFR 15-29 ml/min (HCC) - baseline SCr 2.5. follows with Dr. Malen Gauze with [facility]   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; now atretic and significantly improved LAD lesion without intervention   GERD (gastroesophageal reflux disease)   Chronic anticoagulation   Lower GI bleed   Bleeding hemorrhoids    Body  mass index is 22.67 kg/m.   Bright red blood per rectum Extensive sigmoid diverticulosis Etiology is unclear but concerning for lower GI bleed.differential diagnosis include diverticular bleed, hemorrhoidal bleed- CT angio of abdomen showed no acute bleeding, revealed extensive sigmoid diverticulosis. Eliquis has been held and this will be discontinued at discharge Follow-up with gastroenterologist   Hx of DVT and PE  - she is on Eliquis for provoked PE diagnosed in October 2023 post hip replacement, it was just isolated subsegmental embolus in the medial right middle lobe pulmonary artery. Has been on Eliquis for almost a year.  Plan to discontinue Eliquis at discharge if venous duplex is negative for DVT.   Peptic ulcer disease Continue pantoprazole   Chronic kidney disease stage IV Anion gap metabolic acidosis Creatinine is stable.  Continue oral sodium bicarbonate Discontinue IV fluids   Coronary artery disease, status post CABG She said she is intolerant of aspirin and had been using Plavix prior to switching to Eliquis Outpatient follow-up with cardiologist for further recommendations.   Hypertensive urgency BP still elevated.  Continue antihypertensives.   Hyperkalemia Improved.  Losartan replaced with amlodipine   Chronic obstructive pulmonary disease No evidence of acute exacerbation.  Continue as needed bronchodilators    Other comorbidities include hypothyroidism, anxiety, depression, rheumatoid arthritis            Diet Order             Diet regular Fluid consistency: Thin  Diet effective now  Consultants: Gastroenterologist  Procedures: None    Medications:    amLODipine  10 mg Oral Daily   atorvastatin  80 mg Oral Daily   carvedilol  6.25 mg Oral BID WC   [START ON 12/10/2022] cloNIDine  0.2 mg Transdermal Q Sun   hydrocortisone  25 mg Rectal BID   leflunomide  10 mg Oral Daily   levothyroxine   88 mcg Oral QAC breakfast   pantoprazole  40 mg Oral Daily   psyllium  1 packet Oral Daily   sertraline  25 mg Oral Daily   Continuous Infusions:  sodium chloride 50 mL/hr at 12/09/22 0645     Anti-infectives (From admission, onward)    None              Family Communication/Anticipated D/C date and plan/Code Status   DVT prophylaxis: SCDs Start: 12/08/22 0539     Code Status: DNR  Family Communication: Plan discussed with her daughter and husband at the bedside Disposition Plan: Plan to discharge home tomorrow   Status is: Inpatient Remains inpatient appropriate because: Rectal bleeding, monitoring H&H       Subjective:   Interval events noted.  No bloody stools, abdominal pain or vomiting.  Her husband and daughter were at the bedside.  Objective:    Vitals:   12/08/22 2348 12/09/22 0030 12/09/22 0423 12/09/22 0833  BP:   (!) 171/68 (!) 194/73  Pulse: 87  87 90  Resp: (!) 26 20 20 18   Temp:   98.1 F (36.7 C) 98 F (36.7 C)  TempSrc:   Oral Oral  SpO2: 94%  96% 96%  Weight:      Height:       No data found.   Intake/Output Summary (Last 24 hours) at 12/09/2022 1457 Last data filed at 12/09/2022 0612 Gross per 24 hour  Intake 855.11 ml  Output 2 ml  Net 853.11 ml   Filed Weights   12/08/22 0619  Weight: 58.1 kg    Exam:  GEN: NAD SKIN: Warm and dry EYES: No pallor or icterus ENT: MMM CV: RRR PULM: CTA B ABD: soft, ND, NT, +BS CNS: AAO x 3, non focal EXT: No edema or tenderness        Data Reviewed:   I have personally reviewed following labs and imaging studies:  Labs: Labs show the following:   Basic Metabolic Panel: Recent Labs  Lab 12/07/22 2140 12/08/22 0815 12/09/22 0150  NA 136 136 138  K 5.2* 4.1 4.5  CL 104 104 104  CO2 15* 22 21*  GLUCOSE 141* 110* 114*  BUN 79* 69* 62*  CREATININE 3.68* 3.66* 3.54*  CALCIUM 9.1 9.2 9.0   GFR Estimated Creatinine Clearance: 9.8 mL/min (A) (by C-G formula  based on SCr of 3.54 mg/dL (H)). Liver Function Tests: Recent Labs  Lab 12/07/22 2140  AST 29  ALT 17  ALKPHOS 85  BILITOT 1.2  PROT 6.1*  ALBUMIN 3.2*   No results for input(s): "LIPASE", "AMYLASE" in the last 168 hours. No results for input(s): "AMMONIA" in the last 168 hours. Coagulation profile No results for input(s): "INR", "PROTIME" in the last 168 hours.  CBC: Recent Labs  Lab 12/08/22 0438 12/08/22 0916 12/08/22 1806 12/09/22 0150 12/09/22 1027  WBC 11.9* 11.2* 11.1* 10.5 11.0*  HGB 7.8* 8.0* 9.0* 7.7* 8.1*  HCT 24.3* 25.9* 28.9* 24.2* 26.0*  MCV 97.6 100.8* 98.6 100.4* 97.7  PLT 320 298 344 319 317   Cardiac Enzymes: No results  for input(s): "CKTOTAL", "CKMB", "CKMBINDEX", "TROPONINI" in the last 168 hours. BNP (last 3 results) No results for input(s): "PROBNP" in the last 8760 hours. CBG: No results for input(s): "GLUCAP" in the last 168 hours. D-Dimer: No results for input(s): "DDIMER" in the last 72 hours. Hgb A1c: No results for input(s): "HGBA1C" in the last 72 hours. Lipid Profile: No results for input(s): "CHOL", "HDL", "LDLCALC", "TRIG", "CHOLHDL", "LDLDIRECT" in the last 72 hours. Thyroid function studies: No results for input(s): "TSH", "T4TOTAL", "T3FREE", "THYROIDAB" in the last 72 hours.  Invalid input(s): "FREET3" Anemia work up: No results for input(s): "VITAMINB12", "FOLATE", "FERRITIN", "TIBC", "IRON", "RETICCTPCT" in the last 72 hours. Sepsis Labs: Recent Labs  Lab 12/08/22 0916 12/08/22 1806 12/09/22 0150 12/09/22 1027  WBC 11.2* 11.1* 10.5 11.0*    Microbiology No results found for this or any previous visit (from the past 240 hour(s)).  Procedures and diagnostic studies:  VAS [place] LOWER EXTREMITY VENOUS (DVT)  Result Date: 12/09/2022  Lower Venous DVT Study Patient Name:  AADYA VANESS  Date of Exam:   12/09/2022 Medical Rec #: [id number]       Accession #:    [id number] Date of Birth: [birth year]        Patient Gender: F  Patient Age:   74 years Exam Location:  [facility] Procedure:      VAS [place] LOWER EXTREMITY VENOUS (DVT) Referring Phys: Huey Bienenstock --------------------------------------------------------------------------------  Indications: History of PE, on anticoagulation. Evaluate for DVT as patient needs to stop anticoagulants secondary to new GI Bleed.  Risk Factors: 02/06/2022. Anticoagulation: Eliquis. Limitations: Pain with compression. Comparison Study: Prior negative bilateral LEV done 02/07/2022 Performing Technologist: Sherren Kerns RVS  Examination Guidelines: A complete evaluation includes B-mode imaging, spectral Doppler, color Doppler, and power Doppler as needed of all accessible portions of each vessel. Bilateral testing is considered an integral part of a complete examination. Limited examinations for reoccurring indications may be performed as noted. The reflux portion of the exam is performed with the patient in reverse Trendelenburg.  +---------+---------------+---------+-----------+----------+--------------+ RIGHT    CompressibilityPhasicitySpontaneityPropertiesThrombus Aging +---------+---------------+---------+-----------+----------+--------------+ CFV      Full           Yes      Yes                                 +---------+---------------+---------+-----------+----------+--------------+ SFJ      Full                                                        +---------+---------------+---------+-----------+----------+--------------+ FV Prox  Full                                                        +---------+---------------+---------+-----------+----------+--------------+ FV Mid   Full                                                        +---------+---------------+---------+-----------+----------+--------------+  FV DistalFull                                                         +---------+---------------+---------+-----------+----------+--------------+ PFV      Full                                                        +---------+---------------+---------+-----------+----------+--------------+ POP      Full           Yes      Yes                                 +---------+---------------+---------+-----------+----------+--------------+ PTV      Full                                                        +---------+---------------+---------+-----------+----------+--------------+ PERO     Full                                                        +---------+---------------+---------+-----------+----------+--------------+ Gastroc  Full                                                        +---------+---------------+---------+-----------+----------+--------------+   +---------+---------------+---------+-----------+----------+--------------+ LEFT     CompressibilityPhasicitySpontaneityPropertiesThrombus Aging +---------+---------------+---------+-----------+----------+--------------+ CFV      Full           Yes      Yes                                 +---------+---------------+---------+-----------+----------+--------------+ SFJ      Full                                                        +---------+---------------+---------+-----------+----------+--------------+ FV Prox  Full                                                        +---------+---------------+---------+-----------+----------+--------------+ FV Mid   Full                                                        +---------+---------------+---------+-----------+----------+--------------+  FV DistalFull                                                        +---------+---------------+---------+-----------+----------+--------------+ PFV      Full                                                         +---------+---------------+---------+-----------+----------+--------------+ POP      Full           Yes      Yes                                 +---------+---------------+---------+-----------+----------+--------------+ PTV      Full                                                        +---------+---------------+---------+-----------+----------+--------------+ PERO     Full                                                        +---------+---------------+---------+-----------+----------+--------------+     Summary: BILATERAL: - No evidence of deep vein thrombosis seen in the lower extremities, bilaterally. - RIGHT: - A cystic structure is found in the popliteal fossa.  LEFT: - No cystic structure found in the popliteal fossa.  *See table(s) above for measurements and observations.    Preliminary    CT ANGIO GI BLEED  Result Date: 12/08/2022 CLINICAL DATA:  GI bleed EXAM: CTA ABDOMEN AND PELVIS WITHOUT AND WITH CONTRAST TECHNIQUE: Multidetector CT imaging of the abdomen and pelvis was performed using the standard protocol during bolus administration of intravenous contrast. Multiplanar reconstructed images and MIPs were obtained and reviewed to evaluate the vascular anatomy. RADIATION DOSE REDUCTION: This exam was performed according to the departmental dose-optimization program which includes automated exposure control, adjustment of the mA and/or kV according to patient size and/or use of iterative reconstruction technique. CONTRAST:  75mL OMNIPAQUE IOHEXOL 350 MG/ML SOLN COMPARISON:  MRI abdomen/pelvis dated 11/10/2021 FINDINGS: Lower chest: 18 mm thin-walled lung cyst in the left lower lobe (series 7/image 19). Additional atelectasis/scarring in the bilateral lower lobes. Hepatobiliary: Nodular hepatic contour with enlargement of the left hepatic lobe, suggesting cirrhosis. No focal hepatic lesion is seen. Status post cholecystectomy. No intrahepatic or extrahepatic duct dilatation.  Pancreas: Within normal limits. Spleen: Surgically absent. Adrenals/Urinary Tract: Adrenal glands are within normal limits. Malrotated right kidney. Bilateral renal parenchymal atrophy. Bilateral renal cysts, including a dominant 3.7 cm simple cyst in the posterior right kidney (series 10/image 87) and a dominant 2.7 cm simple cyst in the lateral left upper kidney (series 10/image 39), benign (Bosniak I). No follow-up is recommended. Stomach/Bowel: Stomach is notable for a tiny hiatal hernia. No evidence of bowel obstruction. Suspected appendectomy. Extensive sigmoid diverticulosis,  without evidence of diverticulitis. Vascular/Lymphatic: No spillage of intraluminal contrast following contrast administration to suggest active GI bleeding. No evidence of abdominal aortic aneurysm. Atherosclerotic calcifications of the abdominal aorta and branch vessels. 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 Celiac artery, SMA, and IMA remain patent. No suspicious abdominopelvic lymphadenopathy. Reproductive: Uterus is grossly unremarkable. Bilateral ovaries are within normal limits. Other: No abdominopelvic ascites. Musculoskeletal: Degenerative changes of the visualized thoracolumbar spine. IMPRESSION: No evidence of active GI bleeding. Extensive sigmoid diverticulosis, without evidence of diverticulitis. Additional stable ancillary findings as above. Electronically Signed   By: Charline Bills M.D.   On: 12/08/2022 03:30               LOS: 1 day      [facility]   Pager on [email or web address]. If 7PM-7AM, please contact night-coverage at [email or web address]     12/09/2022, 2:57 PM

## 2022-12-09 NOTE — Progress Notes (Signed)
VASCULAR LAB    Bilateral lower extremity venous duplex has been performed.  See CV proc for preliminary results.   , , RVT 12/09/2022, 1:39 PM

## 2022-12-10 ENCOUNTER — Inpatient Hospital Stay (HOSPITAL_COMMUNITY): Payer: Medicare PPO

## 2022-12-10 DIAGNOSIS — Z7901 Long term (current) use of anticoagulants: Secondary | ICD-10-CM | POA: Diagnosis not present

## 2022-12-10 DIAGNOSIS — J449 Chronic obstructive pulmonary disease, unspecified: Secondary | ICD-10-CM

## 2022-12-10 DIAGNOSIS — K922 Gastrointestinal hemorrhage, unspecified: Secondary | ICD-10-CM | POA: Diagnosis not present

## 2022-12-10 DIAGNOSIS — I10 Essential (primary) hypertension: Secondary | ICD-10-CM | POA: Diagnosis not present

## 2022-12-10 DIAGNOSIS — K625 Hemorrhage of anus and rectum: Secondary | ICD-10-CM | POA: Diagnosis not present

## 2022-12-10 LAB — CBC
HCT: 26.4 % — ABNORMAL LOW (ref 36.0–46.0)
Hemoglobin: 8.1 g/dL — ABNORMAL LOW (ref 12.0–15.0)
MCH: 31.2 pg (ref 26.0–34.0)
MCHC: 30.7 g/dL (ref 30.0–36.0)
MCV: 101.5 fL — ABNORMAL HIGH (ref 80.0–100.0)
Platelets: 354 10*3/uL (ref 150–400)
RBC: 2.6 MIL/uL — ABNORMAL LOW (ref 3.87–5.11)
RDW: 17 % — ABNORMAL HIGH (ref 11.5–15.5)
WBC: 10.3 10*3/uL (ref 4.0–10.5)
nRBC: 1.4 % — ABNORMAL HIGH (ref 0.0–0.2)

## 2022-12-10 MED ORDER — HYDROCORTISONE ACETATE 25 MG RE SUPP: 25.0000 mg | Freq: Two times a day (BID) | RECTAL | Status: AC

## 2022-12-10 MED ORDER — CARVEDILOL 12.5 MG PO TABS: 12.5000 mg | ORAL_TABLET | Freq: Two times a day (BID) | ORAL | Status: DC

## 2022-12-10 MED ORDER — AMLODIPINE BESYLATE 10 MG PO TABS: 10.0000 mg | ORAL_TABLET | Freq: Every day | ORAL | 0 refills | Status: DC

## 2022-12-10 MED ORDER — SERTRALINE HCL 50 MG PO TABS: 25.0000 mg | ORAL_TABLET | Freq: Every day | ORAL | Status: DC

## 2022-12-10 MED ORDER — CLONIDINE 0.1 MG/24HR TD PTWK: 0.2000 mg | MEDICATED_PATCH | TRANSDERMAL | Status: DC

## 2022-12-10 NOTE — Discharge Summary (Signed)
Physician Discharge Summary   Patient: [name] MRN: [id number] DOB: [birth year]  Admit date:     12/07/2022  Discharge date:   Discharge Physician: Lurene Shadow   PCP: [name], MD   Recommendations at discharge:   Follow-up with PCP in 1 week Follow-up with gastroenterologist (office will call to schedule appointment)  Discharge Diagnoses: Principal Problem:   GI bleed Active Problems:   Acquired hypothyroidism   Essential hypertension   Rheumatoid arthritis (HCC)   Hyperlipidemia   COPD (chronic obstructive pulmonary disease) (HCC)   CKD (chronic kidney disease) stage 4, GFR 15-29 ml/min (HCC) - baseline SCr 2.5. follows with Dr. Malen Gauze with [facility]   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; now atretic and significantly improved LAD lesion without intervention   GERD (gastroesophageal reflux disease)   Chronic anticoagulation   Lower GI bleed   Bleeding hemorrhoids  Resolved Problems:   * No resolved hospital problems. *  Hospital Course:  [name] is a 82 y.o. female [name] is a 82 y.o. 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  Patient was hospitalized for similar episodes from 7/28 to 7/31 at [facility], we she underwent EGD and colonoscopy on 11/28/22. EGD showed mild gastritis, salmon colored mucosa in esophagus that was biopsied. prior gastric ulcers that have healed. Colonoscopy showed 3 small polyps that were removed, diverticulosis, hemorrhoids. It was suspected that her bleeding is likely due to a diverticular bleed.  On 7/29 patient had as well tagged RBC scan that was negative for source of GI bleeding. -Patient was  discharged back on Eliquis, she presents with bright red blood per rectum, CT angio GI bleed, showed no evidence of active GI bleeding, extensive sigmoid diverticulosis, without evidence of diverticulitis.    Assessment and Plan:   Bright red blood per rectum Extensive sigmoid diverticulosis Etiology is unclear but concerning for lower GI bleed.differential diagnosis include diverticular bleed, hemorrhoidal bleed- CT angio of abdomen showed no acute bleeding, revealed extensive sigmoid diverticulosis. Eliquis will be discontinued at discharge. Anusol suppositories for 7 days for hemorrhoids.  MiraLAX as needed for constipation. Follow-up with gastroenterologist   Hx of DVT and PE  She was on Eliquis for provoked PE diagnosed in October 2023 post hip replacement, it was just isolated subsegmental embolus in the medial right middle lobe pulmonary artery. Duplex of the lower extremities was negative for DVT.  Eliquis has been discontinued.    Chronic anemia No evidence of overt blood loss.  H&H is stable.  No indication for blood transfusion at this time. She receives Retacrit in the outpatient setting.   Peptic ulcer disease Continue pantoprazole   Chronic kidney disease stage IV Anion gap metabolic acidosis Creatinine is stable.  Continue oral sodium bicarbonate   Coronary artery disease, status post CABG She said she is intolerant of aspirin and had been using Plavix prior to switching to Eliquis Outpatient follow-up with cardiologist or PCP for further recommendations.   Hypertensive urgency BP has improved.  Continue antihypertensives   Hyperkalemia Improved.  Losartan replaced with amlodipine because of concern for recurrent hyperkalemia given CKD stage IV.   Chronic obstructive pulmonary disease No evidence of acute exacerbation.  Continue as needed  bronchodilators   Cough Chest x-ray did not show any acute abnormality.    Other comorbidities include hypothyroidism,  anxiety, depression, rheumatoid arthritis    Her condition has improved and she is deemed stable for discharge to home today.  Discharge plan was discussed with the patient, her husband and daughter at the bedside.      Consultants: Gastroenterologist Procedures performed: None Disposition: Home Diet recommendation:  Discharge Diet Orders (From admission, onward)     Start     Ordered   12/10/22 0000  Diet - low sodium heart healthy        12/10/22 1546           Cardiac diet DISCHARGE MEDICATION: Allergies as of 12/10/2022       Reactions   Nsaids Other (See Comments)   Stomach upset Told to avoid due to Eliquis   Aspirin Other (See Comments)   Stomach upset Told to avoid due to Eliquis   Codeine Nausea And Vomiting   Nitrostat [nitroglycerin] Other (See Comments)   Bradycardia. Drop in heart rate         Medication List     STOP taking these medications    apixaban 2.5 MG Tabs tablet Commonly known as: ELIQUIS   losartan 50 MG tablet Commonly known as: COZAAR       TAKE these medications    acetaminophen 325 MG tablet Commonly known as: TYLENOL Take 2 tablets (650 mg total) by mouth 2 (two) times daily as needed (generalized pain).   albuterol 108 (90 Base) MCG/ACT inhaler Commonly known as: VENTOLIN HFA Inhale 1-2 puffs into the lungs every 4 (four) hours as needed for wheezing or shortness of breath.   ALLERGY 24-HR PO Take 1 tablet by mouth in the morning and at bedtime.   amLODipine 10 MG tablet Commonly known as: NORVASC Take 1 tablet (10 mg total) by mouth daily. Start taking on: December 11, 2022   ascorbic acid 500 MG tablet Commonly known as: VITAMIN C Take 1 tablet (500 mg total) by mouth daily.   atorvastatin 80 MG tablet Commonly known as: LIPITOR Take 1 tablet (80 mg total) by mouth daily.   carvedilol 6.25 MG tablet Commonly known as: COREG Take 6.25 mg by mouth 2 (two) times daily with a meal.   CENTRUM ADULT PO Take  1 tablet by mouth daily. 1 Tablet Daily.   cloNIDine 0.1 mg/24hr patch Commonly known as: CATAPRES - Dosed in mg/24 hr Place 2 patches (0.2 mg total) onto the skin once a week. Applies every Sunday.   cloNIDine 0.2 MG tablet Commonly known as: CATAPRES Take 1 tablet (0.2 mg total) by mouth 4 (four) times daily as needed for up to 30 doses. For systolic blood pressure (top number) over 200 mmhg What changed:  reasons to take this additional instructions   Cyanocobalamin 2500 MCG Tabs Take 2,500 mcg by mouth daily.   hydrocortisone 25 MG suppository Commonly known as: ANUSOL-HC Place 1 suppository (25 mg total) rectally 2 (two) times daily for 7 days.   leflunomide 20 MG tablet Commonly known as: ARAVA Take 1 tablet (20 mg total) by mouth daily.   levothyroxine 88 MCG tablet Commonly known as: SYNTHROID Take 1 tablet (88 mcg total) by mouth daily before breakfast.   pantoprazole 40 MG tablet Commonly known as: PROTONIX Take 1 tablet (40 mg total) by mouth daily.   sertraline 50 MG tablet Commonly known as: ZOLOFT Take 0.5 tablets (25 mg total) by mouth daily.  torsemide 20 MG tablet Commonly known as: DEMADEX Take 20 mg by mouth See admin instructions. Take 20 mg (1 tablet) by mouth twice a week on Tuesday's and Saturday's.   triamcinolone 55 MCG/ACT Aero nasal inhaler Commonly known as: NASACORT Place 1 spray into the nose daily.   Vitamin D3 50 MCG (2000 UT) capsule Take 1 capsule (2,000 Units total) by mouth daily.        Discharge Exam: Filed Weights   12/08/22 0619  Weight: 58.1 kg   GEN: NAD SKIN: Warm and dry EYES: No pallor or icterus ENT: MMM CV: RRR PULM: Bibasilar rales, no wheezing. Air entry adequate bilaterally.  ABD: soft, ND, NT, +BS CNS: AAO x 3, non focal EXT: No edema or tenderness   Condition at discharge: good  The results of significant diagnostics from this hospitalization (including imaging, microbiology, ancillary and  laboratory) are listed below for reference.   Imaging Studies: DG Chest 2 View  Result Date: 12/10/2022 CLINICAL DATA:  Cough. EXAM: CHEST - 2 VIEW COMPARISON:  Chest radiograph dated 07/15/2022. FINDINGS: Bibasilar atelectasis/scarring. No focal consolidation, pleural effusion, or pneumothorax. Top-normal cardiac silhouette. Median sternotomy wires and CABG vascular clips. No acute osseous pathology. IMPRESSION: No active cardiopulmonary disease. Electronically Signed   By: Elgie Collard M.D.   On: 12/10/2022 15:24   VAS [place] LOWER EXTREMITY VENOUS (DVT)  Result Date: 12/10/2022  Lower Venous DVT Study Patient Name:  [name]  Date of Exam:   12/09/2022 Medical Rec #: [id number]       Accession #:    [id number] Date of Birth: [birth year]        Patient Gender: F Patient Age:   55 years Exam Location:  [facility] Procedure:      VAS [place] LOWER EXTREMITY VENOUS (DVT) Referring Phys: Huey Bienenstock --------------------------------------------------------------------------------  Indications: History of PE, on anticoagulation. Evaluate for DVT as patient needs to stop anticoagulants secondary to new GI Bleed.  Risk Factors: 02/06/2022. Anticoagulation: Eliquis. Limitations: Pain with compression. Comparison Study: Prior negative bilateral LEV done 02/07/2022 Performing Technologist: Sherren Kerns RVS  Examination Guidelines: A complete evaluation includes B-mode imaging, spectral Doppler, color Doppler, and power Doppler as needed of all accessible portions of each vessel. Bilateral testing is considered an integral part of a complete examination. Limited examinations for reoccurring indications may be performed as noted. The reflux portion of the exam is performed with the patient in reverse Trendelenburg.  +---------+---------------+---------+-----------+----------+--------------+ RIGHT    CompressibilityPhasicitySpontaneityPropertiesThrombus Aging  +---------+---------------+---------+-----------+----------+--------------+ CFV      Full           Yes      Yes                                 +---------+---------------+---------+-----------+----------+--------------+ SFJ      Full                                                        +---------+---------------+---------+-----------+----------+--------------+ FV Prox  Full                                                        +---------+---------------+---------+-----------+----------+--------------+  FV Mid   Full                                                        +---------+---------------+---------+-----------+----------+--------------+ FV DistalFull                                                        +---------+---------------+---------+-----------+----------+--------------+ PFV      Full                                                        +---------+---------------+---------+-----------+----------+--------------+ POP      Full           Yes      Yes                                 +---------+---------------+---------+-----------+----------+--------------+ PTV      Full                                                        +---------+---------------+---------+-----------+----------+--------------+ PERO     Full                                                        +---------+---------------+---------+-----------+----------+--------------+ Gastroc  Full                                                        +---------+---------------+---------+-----------+----------+--------------+   +---------+---------------+---------+-----------+----------+--------------+ LEFT     CompressibilityPhasicitySpontaneityPropertiesThrombus Aging +---------+---------------+---------+-----------+----------+--------------+ CFV      Full           Yes      Yes                                  +---------+---------------+---------+-----------+----------+--------------+ SFJ      Full                                                        +---------+---------------+---------+-----------+----------+--------------+ FV Prox  Full                                                        +---------+---------------+---------+-----------+----------+--------------+  FV Mid   Full                                                        +---------+---------------+---------+-----------+----------+--------------+ FV DistalFull                                                        +---------+---------------+---------+-----------+----------+--------------+ PFV      Full                                                        +---------+---------------+---------+-----------+----------+--------------+ POP      Full           Yes      Yes                                 +---------+---------------+---------+-----------+----------+--------------+ PTV      Full                                                        +---------+---------------+---------+-----------+----------+--------------+ PERO     Full                                                        +---------+---------------+---------+-----------+----------+--------------+     Summary: BILATERAL: - No evidence of deep vein thrombosis seen in the lower extremities, bilaterally. - RIGHT: - A cystic structure is found in the popliteal fossa.  LEFT: - No cystic structure found in the popliteal fossa.  *See table(s) above for measurements and observations. Electronically signed by Gerarda Fraction on 12/10/2022 at 9:25:30 AM.    Final    CT ANGIO GI BLEED  Result Date: 12/08/2022 CLINICAL DATA:  GI bleed EXAM: CTA ABDOMEN AND PELVIS WITHOUT AND WITH CONTRAST TECHNIQUE: Multidetector CT imaging of the abdomen and pelvis was performed using the standard protocol during bolus administration of intravenous contrast. Multiplanar  reconstructed images and MIPs were obtained and reviewed to evaluate the vascular anatomy. RADIATION DOSE REDUCTION: This exam was performed according to the departmental dose-optimization program which includes automated exposure control, adjustment of the mA and/or kV according to patient size and/or use of iterative reconstruction technique. CONTRAST:  75mL OMNIPAQUE IOHEXOL 350 MG/ML SOLN COMPARISON:  MRI abdomen/pelvis dated 11/10/2021 FINDINGS: Lower chest: 18 mm thin-walled lung cyst in the left lower lobe (series 7/image 19). Additional atelectasis/scarring in the bilateral lower lobes. Hepatobiliary: Nodular hepatic contour with enlargement of the left hepatic lobe, suggesting cirrhosis. No focal hepatic lesion is seen. Status post cholecystectomy. No intrahepatic or extrahepatic duct dilatation. Pancreas: Within normal limits. Spleen: Surgically absent. Adrenals/Urinary Tract: Adrenal glands are  within normal limits. Malrotated right kidney. Bilateral renal parenchymal atrophy. Bilateral renal cysts, including a dominant 3.7 cm simple cyst in the posterior right kidney (series 10/image 87) and a dominant 2.7 cm simple cyst in the lateral left upper kidney (series 10/image 39), benign (Bosniak I). No follow-up is recommended. Stomach/Bowel: Stomach is notable for a tiny hiatal hernia. No evidence of bowel obstruction. Suspected appendectomy. Extensive sigmoid diverticulosis, without evidence of diverticulitis. Vascular/Lymphatic: No spillage of intraluminal contrast following contrast administration to suggest active GI bleeding. No evidence of abdominal aortic aneurysm. Atherosclerotic calcifications of the abdominal aorta and branch vessels. 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 Celiac artery, SMA, and IMA remain patent. No suspicious abdominopelvic lymphadenopathy. Reproductive: Uterus is grossly unremarkable. Bilateral ovaries are within normal limits. Other: No abdominopelvic ascites.  Musculoskeletal: Degenerative changes of the visualized thoracolumbar spine. IMPRESSION: No evidence of active GI bleeding. Extensive sigmoid diverticulosis, without evidence of diverticulitis. Additional stable ancillary findings as above. Electronically Signed   By: Charline Bills M.D.   On: 12/08/2022 03:30   NM GI Blood Loss  Result Date: 11/27/2022 CLINICAL DATA:  82 year old with painless hematochezia. EXAM: NUCLEAR MEDICINE GASTROINTESTINAL BLEEDING SCAN TECHNIQUE: Sequential abdominal images were obtained following intravenous administration of Tc-19m labeled red blood cells. RADIOPHARMACEUTICALS:  23.5 mCi Tc-52m pertechnetate in-vitro labeled red cells. COMPARISON:  11/18/2021 FINDINGS: Normal distribution of the radiopharmaceutical on the 1 hour and 2 hour imaging. No evidence for a GI bleed. IMPRESSION: No evidence for active gastrointestinal bleeding. Electronically Signed   By: Richarda Overlie M.D.   On: 11/27/2022 17:14    Microbiology: Results for orders placed or performed during the hospital encounter of 07/02/22  Resp panel by RT-PCR (RSV, Flu A&B, Covid) Peripheral     Status: None   Collection Time: 07/02/22 11:00 PM   Specimen: Peripheral; Nasal Swab  Result Value Ref Range Status   SARS Coronavirus 2 by RT PCR NEGATIVE NEGATIVE Final   Influenza A by PCR NEGATIVE NEGATIVE Final   Influenza B by PCR NEGATIVE NEGATIVE Final    Comment: (NOTE) The Xpert Xpress SARS-CoV-2/FLU/RSV plus assay is intended as an aid in the diagnosis of influenza from Nasopharyngeal swab specimens and should not be used as a sole basis for treatment. Nasal washings and aspirates are unacceptable for Xpert Xpress SARS-CoV-2/FLU/RSV testing.  Fact Sheet for Patients: BloggerCourse.com  Fact Sheet for Healthcare Providers: SeriousBroker.it  This test is not yet approved or cleared by the [place] FDA and has been authorized for detection  and/or diagnosis of SARS-CoV-2 by FDA under an Emergency Use Authorization (EUA). This EUA will remain in effect (meaning this test can be used) for the duration of the COVID-19 declaration under Section 564(b)(1) of the Act, 21 U.S.C. section 360bbb-3(b)(1), unless the authorization is terminated or revoked.     Resp Syncytial Virus by PCR NEGATIVE NEGATIVE Final    Comment: (NOTE) Fact Sheet for Patients: BloggerCourse.com  Fact Sheet for Healthcare Providers: SeriousBroker.it  This test is not yet approved or cleared by the [place] FDA and has been authorized for detection and/or diagnosis of SARS-CoV-2 by FDA under an Emergency Use Authorization (EUA). This EUA will remain in effect (meaning this test can be used) for the duration of the COVID-19 declaration under Section 564(b)(1) of the Act, 21 U.S.C. section 360bbb-3(b)(1), unless the authorization is terminated or revoked.  Performed at [facility], [address] [place], [place] [address]   Blood Culture (routine x 2)  Status: Abnormal   Collection Time: 07/02/22 11:00 PM   Specimen: BLOOD  Result Value Ref Range Status   Specimen Description BLOOD RIGHT ARM  Final   Special Requests   Final    BOTTLES DRAWN AEROBIC AND ANAEROBIC Blood Culture adequate volume   Culture  Setup Time   Final    GRAM NEGATIVE RODS IN BOTH AEROBIC AND ANAEROBIC BOTTLES CRITICAL RESULT CALLED TO, READ BACK BY AND VERIFIED WITH: CATHY PIERCE ON 07/03/22 @ 1520 BY DRT Performed at [facility], [address], [place], [place] [address]    Culture ESCHERICHIA COLI (A)  Final   Report Status 07/05/2022 FINAL  Final   Organism ID, Bacteria ESCHERICHIA COLI  Final      Susceptibility   Escherichia coli - MIC*    AMPICILLIN >=32 RESISTANT Resistant     CEFEPIME <=0.12 SENSITIVE Sensitive     CEFTAZIDIME <=1 SENSITIVE Sensitive     CEFTRIAXONE <=0.25 SENSITIVE Sensitive      CIPROFLOXACIN <=0.25 SENSITIVE Sensitive     GENTAMICIN <=1 SENSITIVE Sensitive     IMIPENEM <=0.25 SENSITIVE Sensitive     TRIMETH/SULFA <=20 SENSITIVE Sensitive     AMPICILLIN/SULBACTAM 8 SENSITIVE Sensitive     PIP/TAZO <=4 SENSITIVE Sensitive     * ESCHERICHIA COLI  Blood Culture ID Panel (Reflexed)     Status: Abnormal   Collection Time: 07/02/22 11:00 PM  Result Value Ref Range Status   Enterococcus faecalis NOT DETECTED NOT DETECTED Final   Enterococcus Faecium NOT DETECTED NOT DETECTED Final   Listeria monocytogenes NOT DETECTED NOT DETECTED Final   Staphylococcus species NOT DETECTED NOT DETECTED Final   Staphylococcus aureus (BCID) NOT DETECTED NOT DETECTED Final   Staphylococcus epidermidis NOT DETECTED NOT DETECTED Final   Staphylococcus lugdunensis NOT DETECTED NOT DETECTED Final   Streptococcus species NOT DETECTED NOT DETECTED Final   Streptococcus agalactiae NOT DETECTED NOT DETECTED Final   Streptococcus pneumoniae NOT DETECTED NOT DETECTED Final   Streptococcus pyogenes NOT DETECTED NOT DETECTED Final   A.calcoaceticus-baumannii NOT DETECTED NOT DETECTED Final   Bacteroides fragilis NOT DETECTED NOT DETECTED Final   Enterobacterales DETECTED (A) NOT DETECTED Final    Comment: Enterobacterales represent a large order of gram negative bacteria, not a single organism. CRITICAL RESULT CALLED TO, READ BACK BY AND VERIFIED WITH: CATHY PIERCE ON 07/03/22 @ 1520 BY DRT    Enterobacter cloacae complex NOT DETECTED NOT DETECTED Final   Escherichia coli DETECTED (A) NOT DETECTED Final    Comment: CRITICAL RESULT CALLED TO, READ BACK BY AND VERIFIED WITH: CATHY PIERCE ON 07/03/22 @ 1520 BY DRT    Klebsiella aerogenes NOT DETECTED NOT DETECTED Final   Klebsiella oxytoca NOT DETECTED NOT DETECTED Final   Klebsiella pneumoniae NOT DETECTED NOT DETECTED Final   Proteus species NOT DETECTED NOT DETECTED Final   Salmonella species NOT DETECTED NOT DETECTED Final   Serratia  marcescens NOT DETECTED NOT DETECTED Final   Haemophilus influenzae NOT DETECTED NOT DETECTED Final   Neisseria meningitidis NOT DETECTED NOT DETECTED Final   Pseudomonas aeruginosa NOT DETECTED NOT DETECTED Final   Stenotrophomonas maltophilia NOT DETECTED NOT DETECTED Final   Candida albicans NOT DETECTED NOT DETECTED Final   Candida auris NOT DETECTED NOT DETECTED Final   Candida glabrata NOT DETECTED NOT DETECTED Final   Candida krusei NOT DETECTED NOT DETECTED Final   Candida parapsilosis NOT DETECTED NOT DETECTED Final   Candida tropicalis NOT DETECTED NOT DETECTED Final   Cryptococcus  neoformans/gattii NOT DETECTED NOT DETECTED Final   CTX-M ESBL NOT DETECTED NOT DETECTED Final   Carbapenem resistance IMP NOT DETECTED NOT DETECTED Final   Carbapenem resistance KPC NOT DETECTED NOT DETECTED Final   Carbapenem resistance NDM NOT DETECTED NOT DETECTED Final   Carbapenem resist OXA 48 LIKE NOT DETECTED NOT DETECTED Final   Carbapenem resistance VIM NOT DETECTED NOT DETECTED Final    Comment: Performed at [facility], [address] [place], [place] [address]  Urine Culture (for pregnant, neutropenic or urologic patients or patients with an indwelling urinary catheter)     Status: Abnormal   Collection Time: 07/02/22 11:29 PM   Specimen: Urine, Catheterized  Result Value Ref Range Status   Specimen Description URINE, CATHETERIZED  Final   Special Requests   Final    NONE Performed at [facility], [address], [place], [place] [address]    Culture (A)  Final    >=100,000 COLONIES/mL ESCHERICHIA COLI 80,000 COLONIES/mL KLEBSIELLA PNEUMONIAE    Report Status 07/05/2022 FINAL  Final   Organism ID, Bacteria ESCHERICHIA COLI (A)  Final   Organism ID, Bacteria KLEBSIELLA PNEUMONIAE (A)  Final      Susceptibility   Escherichia coli - MIC*    AMPICILLIN >=32 RESISTANT Resistant     CEFAZOLIN <=4 SENSITIVE Sensitive     CEFEPIME <=0.12 SENSITIVE Sensitive      CEFTRIAXONE <=0.25 SENSITIVE Sensitive     CIPROFLOXACIN <=0.25 SENSITIVE Sensitive     GENTAMICIN <=1 SENSITIVE Sensitive     IMIPENEM <=0.25 SENSITIVE Sensitive     NITROFURANTOIN <=16 SENSITIVE Sensitive     TRIMETH/SULFA <=20 SENSITIVE Sensitive     AMPICILLIN/SULBACTAM 4 SENSITIVE Sensitive     PIP/TAZO <=4 SENSITIVE Sensitive     * >=100,000 COLONIES/mL ESCHERICHIA COLI   Klebsiella pneumoniae - MIC*    AMPICILLIN RESISTANT Resistant     CEFAZOLIN <=4 SENSITIVE Sensitive     CEFEPIME <=0.12 SENSITIVE Sensitive     CEFTRIAXONE <=0.25 SENSITIVE Sensitive     CIPROFLOXACIN <=0.25 SENSITIVE Sensitive     GENTAMICIN <=1 SENSITIVE Sensitive     IMIPENEM <=0.25 SENSITIVE Sensitive     NITROFURANTOIN 32 SENSITIVE Sensitive     TRIMETH/SULFA <=20 SENSITIVE Sensitive     AMPICILLIN/SULBACTAM <=2 SENSITIVE Sensitive     PIP/TAZO <=4 SENSITIVE Sensitive     * 80,000 COLONIES/mL KLEBSIELLA PNEUMONIAE  Blood Culture (routine x 2)     Status: Abnormal   Collection Time: 07/03/22  2:20 AM   Specimen: BLOOD  Result Value Ref Range Status   Specimen Description BLOOD LEFT ARM  Final   Special Requests   Final    BOTTLES DRAWN AEROBIC AND ANAEROBIC Blood Culture adequate volume   Culture  Setup Time   Final    GRAM NEGATIVE RODS IN BOTH AEROBIC AND ANAEROBIC BOTTLES CRITICAL VALUE NOTED.  VALUE IS CONSISTENT WITH PREVIOUSLY REPORTED AND CALLED VALUE.    Culture (A)  Final    ESCHERICHIA COLI SUSCEPTIBILITIES PERFORMED ON PREVIOUS CULTURE WITHIN THE LAST 5 DAYS. Performed at [facility], [address] [place], [place] [address]    Report Status 07/05/2022 FINAL  Final    Labs: CBC: Recent Labs  Lab 12/08/22 0916 12/08/22 1806 12/09/22 0150 12/09/22 1027 12/10/22 0135  WBC 11.2* 11.1* 10.5 11.0* 10.3  HGB 8.0* 9.0* 7.7* 8.1* 8.1*  HCT 25.9* 28.9* 24.2* 26.0* 26.4*  MCV 100.8* 98.6 100.4* 97.7 101.5*  PLT 298 344  319 317 354   Basic Metabolic Panel: Recent Labs   Lab 12/07/22 2140 12/08/22 0815 12/09/22 0150  NA 136 136 138  K 5.2* 4.1 4.5  CL 104 104 104  CO2 15* 22 21*  GLUCOSE 141* 110* 114*  BUN 79* 69* 62*  CREATININE 3.68* 3.66* 3.54*  CALCIUM 9.1 9.2 9.0   Liver Function Tests: Recent Labs  Lab 12/07/22 2140  AST 29  ALT 17  ALKPHOS 85  BILITOT 1.2  PROT 6.1*  ALBUMIN 3.2*   CBG: No results for input(s): "GLUCAP" in the last 168 hours.  Discharge time spent: greater than 30 minutes.  Signed: Lurene Shadow, MD [facility] 12/10/2022

## 2022-12-10 NOTE — Progress Notes (Signed)
MEWS Progress Note  Patient Details Name: SAMARIS MOZQUEDA MRN: [id number] DOB: July 14, 1940 Today's Date: 12/10/2022   MEWS Flowsheet Documentation:  Assess: MEWS Score Temp: 97.7 F (36.5 C) BP: (!) 205/88 MAP (mmHg): 122 Pulse Rate: 89 ECG Heart Rate: 75 Resp: 18 Level of Consciousness: Alert SpO2: 93 % O2 Device: Room Air Patient Activity (if Appropriate): In bed Assess: MEWS Score MEWS Temp: 0 MEWS Systolic: 2 MEWS Pulse: 0 MEWS RR: 0 MEWS LOC: 0 MEWS Score: 2 MEWS Score Color: Yellow Assess: SIRS CRITERIA SIRS Temperature : 0 SIRS Respirations : 0 SIRS Pulse: 0 SIRS WBC: 0 SIRS Score Sum : 0 Assess: if the MEWS score is Yellow or Red Were vital signs accurate and taken at a resting state?: Yes Does the patient meet 2 or more of the SIRS criteria?: No MEWS guidelines implemented : Yes, yellow Treat MEWS Interventions: Considered administering scheduled or prn medications/treatments as ordered Take Vital Signs Increase Vital Sign Frequency : Yellow: Q2hr x1, continue Q4hrs until patient remains green for 12hrs Escalate MEWS: Escalate: Yellow: Discuss with charge nurse and consider notifying provider and/or RRT Notify: Charge Nurse/RN Name of Charge Nurse/RN Notified: Carollee Herter, RN  PRN med given    Despina Hidden 12/10/2022, 5:55 AM

## 2022-12-10 NOTE — Progress Notes (Signed)
 Discharge instructions given to pt. Pt verbalized understanding of all teaching and had no further questions. Pt discharged to home with family via wheelchair with all belongings and paperwork

## 2022-12-10 NOTE — Progress Notes (Signed)
   12/10/22 1507  Vitals  Temp (!) 97.5 F (36.4 C)  Temp Source Oral  BP (!) 133/51  MAP (mmHg) 74  BP Location Left Arm  BP Method Automatic  Patient Position (if appropriate) Lying  Pulse Rate 76  Pulse Rate Source Monitor  Resp 17  MEWS COLOR  MEWS Score Color Green  Oxygen Therapy  SpO2 94 %  O2 Device Room Air  MEWS Score  MEWS Temp 0  MEWS Systolic 0  MEWS Pulse 0  MEWS RR 0  MEWS LOC 0  MEWS Score 0

## 2022-12-10 NOTE — Progress Notes (Addendum)
Daily Progress Note  DOA: 12/07/2022 Hospital Day: 4  Chief Complaint: rectal bleeding  ASSESSMENT & PLAN   Brief Narrative:  [name] is a 82 y.o. year old female with a history of adenomatous colon polyps, AAA repair 1, CAD status post CABG in 1996, celiac artery stenosis, CKD IV, COPD, history of TIA/CVA , Hx of PE on Eliquis, RA, diverticulosis, diverticular hemorrhage, adenomatous colon polyps.  Admitted with recurrent hematochezia.    Recurrent painless rectal bleeding on Eliquis  CTA negative for active bleeding. Anoscopy this admission suggesting this was hemorrhoidal bleeding.  --No further bleeding . Hgb stable around 8 over the last 24 hours.  -Continue BID Anusol suppositories x 7 days --She has taken metamucil for years. Has incomplete rectal empty result in 3-4 small volume BMs / day. She will try daily Miralax going forward. Advised her to call our office if Miralax wasn't helping as we can try something different.    History of PE on Eliquis at home. No evidence for bilateral DVT on [place] yesterday  and so Eliquis has been discontinued  ------------------------------------------------------------------------------------------------    [facility] Attending   I have taken an interval history, reviewed the chart and examined the patient. I agree with the Advanced Practitioner's note, impression and recommendations.   Iva Boop, MD, [facility] See Loretha Stapler on call - gastroenterology for best contact person 12/10/2022 1:30 PM   Subjective  No further bleeding. Eating lunch. No complaints  Objective   Anoscopy 8/10   Gr 2 internal external inflamed LL complex of internal external hemorrhoids and stigmata of bleeding. Brown stool balls. No proctitis. Smaller RA and RP hemorrhoids.   Recent Labs    12/09/22 0150 12/09/22 1027 12/10/22 0135  WBC 10.5 11.0* 10.3  HGB 7.7* 8.1* 8.1*  HCT 24.2* 26.0* 26.4*  PLT 319 317 354    BMET Recent Labs    12/07/22 2140 12/08/22 0815 12/09/22 0150  NA 136 136 138  K 5.2* 4.1 4.5  CL 104 104 104  CO2 15* 22 21*  GLUCOSE 141* 110* 114*  BUN 79* 69* 62*  CREATININE 3.68* 3.66* 3.54*  CALCIUM 9.1 9.2 9.0   LFT Recent Labs    12/07/22 2140  PROT 6.1*  ALBUMIN 3.2*  AST 29  ALT 17  ALKPHOS 85  BILITOT 1.2     Imaging:  VAS [place] LOWER EXTREMITY VENOUS (DVT)  Lower Venous DVT Study  Patient Name:  [name]  Date of Exam:   12/09/2022 Medical Rec #: [id number]       Accession #:    [id number] Date of Birth: 29-Jun-1940        Patient Gender: F Patient Age:   68 years Exam Location:  [facility] Procedure:      VAS [place] LOWER EXTREMITY VENOUS (DVT) Referring Phys: Huey Bienenstock  --------------------------------------------------------------------------------   Indications: History of PE, on anticoagulation. Evaluate for DVT as patient needs to stop anticoagulants secondary to new GI Bleed.   Risk Factors: 02/06/2022. Anticoagulation: Eliquis. Limitations: Pain with compression. Comparison Study: Prior negative bilateral LEV done 02/07/2022  Performing Technologist: Sherren Kerns RVS    Examination Guidelines: A complete evaluation includes B-mode imaging, spectral Doppler, color Doppler, and power Doppler as needed of all accessible portions of each vessel. Bilateral testing is considered an integral part of a complete examination. Limited examinations for reoccurring indications may be performed as noted. The reflux portion of the exam is performed with  the patient in reverse Trendelenburg.     +---------+---------------+---------+-----------+----------+--------------+ RIGHT    CompressibilityPhasicitySpontaneityPropertiesThrombus Aging +---------+---------------+---------+-----------+----------+--------------+ CFV      Full           Yes      Yes                                  +---------+---------------+---------+-----------+----------+--------------+ SFJ      Full                                                        +---------+---------------+---------+-----------+----------+--------------+ FV Prox  Full                                                        +---------+---------------+---------+-----------+----------+--------------+ FV Mid   Full                                                        +---------+---------------+---------+-----------+----------+--------------+ FV DistalFull                                                        +---------+---------------+---------+-----------+----------+--------------+ PFV      Full                                                        +---------+---------------+---------+-----------+----------+--------------+ POP      Full           Yes      Yes                                 +---------+---------------+---------+-----------+----------+--------------+ PTV      Full                                                        +---------+---------------+---------+-----------+----------+--------------+ PERO     Full                                                        +---------+---------------+---------+-----------+----------+--------------+ Gastroc  Full                                                        +---------+---------------+---------+-----------+----------+--------------+        +---------+---------------+---------+-----------+----------+--------------+  LEFT     CompressibilityPhasicitySpontaneityPropertiesThrombus Aging +---------+---------------+---------+-----------+----------+--------------+ CFV      Full           Yes      Yes                                 +---------+---------------+---------+-----------+----------+--------------+ SFJ      Full                                                         +---------+---------------+---------+-----------+----------+--------------+ FV Prox  Full                                                        +---------+---------------+---------+-----------+----------+--------------+ FV Mid   Full                                                        +---------+---------------+---------+-----------+----------+--------------+ FV DistalFull                                                        +---------+---------------+---------+-----------+----------+--------------+ PFV      Full                                                        +---------+---------------+---------+-----------+----------+--------------+ POP      Full           Yes      Yes                                 +---------+---------------+---------+-----------+----------+--------------+ PTV      Full                                                        +---------+---------------+---------+-----------+----------+--------------+ PERO     Full                                                        +---------+---------------+---------+-----------+----------+--------------+             Summary: BILATERAL: - No evidence of deep vein thrombosis seen in the lower extremities, bilaterally. - RIGHT: - A cystic structure is found in the popliteal fossa.   LEFT: - No cystic structure found  in the popliteal fossa.    *See table(s) above for measurements and observations.  Electronically signed by Gerarda Fraction on 12/10/2022 at 9:25:30 AM.      Final       Scheduled inpatient medications:   amLODipine  10 mg Oral Daily   atorvastatin  80 mg Oral Daily   carvedilol  6.25 mg Oral BID WC   cloNIDine  0.2 mg Transdermal Q Sun   hydrocortisone  25 mg Rectal BID   leflunomide  10 mg Oral Daily   levothyroxine  88 mcg Oral QAC breakfast   pantoprazole  40 mg Oral Daily   psyllium  1 packet Oral Daily   sertraline  25 mg Oral  Daily   Continuous inpatient infusions:  PRN inpatient medications: acetaminophen **OR** acetaminophen, albuterol, cloNIDine, hydrALAZINE, ondansetron **OR** ondansetron (ZOFRAN) IV  Vital signs in last 24 hours: Temp:  [97.7 F (36.5 C)-98 F (36.7 C)] 97.9 F (36.6 C) (08/11 0749) Pulse Rate:  [85-103] 102 (08/11 0749) Resp:  [17-20] 20 (08/11 0749) BP: (142-205)/(55-88) 187/70 (08/11 0749) SpO2:  [80 %-93 %] 93 % (08/11 0749) Last BM Date : 12/07/22 No intake or output data in the 24 hours ending 12/10/22 1234  Intake/Output from previous day: No intake/output data recorded. Intake/Output this shift: No intake/output data recorded.   Physical Exam:  General: Alert female in NAD. Family in room Heart:  Regular rate .  Pulmonary: Normal respiratory effort Abdomen: Soft, nondistended, nontender. Normal bowel sounds Neurologic: Alert and oriented Psych: Pleasant. Cooperative. Insight appears normal.    Principal Problem:   GI bleed Active Problems:   Acquired hypothyroidism   Essential hypertension   Rheumatoid arthritis (HCC)   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; now atretic and significantly improved LAD lesion without intervention   Hyperlipidemia   COPD (chronic obstructive pulmonary disease) (HCC)   CKD (chronic kidney disease) stage 4, GFR 15-29 ml/min (HCC) - baseline SCr 2.5. follows with Dr. Malen Gauze with [facility]   GERD (gastroesophageal reflux disease)   Chronic anticoagulation   Lower GI bleed   Bleeding hemorrhoids     LOS: 2 days   Willette Cluster ,NP 12/10/2022, 12:34 PM

## 2022-12-12 ENCOUNTER — Telehealth: Payer: Self-pay | Admitting: *Deleted

## 2022-12-12 ENCOUNTER — Ambulatory Visit: Payer: Self-pay | Admitting: Licensed Clinical Social Worker

## 2022-12-12 NOTE — Transitions of Care (Post Inpatient/ED Visit) (Signed)
   12/12/2022  Name: [name] MRN: [id number] DOB: [birth year]  Today's TOC FU Call Status: Today's TOC FU Call Status:: Unsuccessful Call (1st Attempt) Unsuccessful Call (1st Attempt) Date: 12/12/22  Attempted to reach the patient regarding the most recent Inpatient visit; left HIPAA compliant voice message requesting call back  Follow Up Plan: Additional outreach attempts will be made to reach the patient to complete the Transitions of Care (Post Inpatient visit) call.   Caryl Pina, RN, BSN, CCRN Alumnus RN CM Care Coordination/ Transition of Care- [facility] Care Management [phone]: direct office

## 2022-12-12 NOTE — Patient Outreach (Signed)
  Care Coordination   Follow Up Visit Note   12/12/2022 Name: ERINE PRAYER MRN: [id number] DOB: 1941-04-15  EBONNIE STAHELI is a 82 y.o. year old female who sees Plotnikov, Georgina Quint, MD for primary care. I spoke with  [name] by phone today.  What matters to the patients health and wellness today? Patient is concerned over legal issues and ensuring that legal matters are in order for her and her spouse    Goals Addressed             This Visit's Progress    Patient Stated  she is concerned about legal issues and financial issues and ensuring that affairs are in order for her and her spouse       Interventions:  Spoke with [name] via phone today about her current needs and needs of her spouse. Discussed program support for client with RN, LCSW, and Pharmacist Spoke with Brytnie about her recent meeting with attorney. She said she and her spouse met with attorney recently and that attorney is helping her with legal matters.She said attorney has sent them documents to review. They are planning to review documents and meet again, as needed, with attorney Discussed recent hospitalization of client Discussed  relaxation techniques. Client and spouse are on trip to beach at present. She is enjoying this trip Encouraged client to call LCSW as needed for SW support at [phone].           SDOH assessments and interventions completed:  Yes  SDOH Interventions Today    Flowsheet Row Most Recent Value  SDOH Interventions   Depression Interventions/Treatment  Counseling  Physical Activity Interventions Other (Comments)  [may have mobility challenges]  Stress Interventions Provide Counseling  [has stress in managing medical needs]        Care Coordination Interventions:  Yes, provided   Interventions Today    Flowsheet Row Most Recent Value  Chronic Disease   Chronic disease during today's visit Other  [spoke with client about client needs]  General Interventions    General Interventions Discussed/Reviewed General Interventions Discussed, Community Resources  [discussed program support]  Horticulturist, commercial (DME) Environmental consultant  Education Interventions   Education Provided Provided Education  Provided Engineer, [employer] On [employer]  Mental Health Interventions   Mental Health Discussed/Reviewed Coping Strategies  [no mood problems mentioned by client]  Nutrition Interventions   Nutrition Discussed/Reviewed Nutrition Discussed  Pharmacy Interventions   Pharmacy Dicussed/Reviewed Pharmacy Topics Discussed        Follow up plan: Client has name and phone number of LCSW and will call LCSW as needed for SW support   Encounter Outcome:  Pt. Visit Completed   Kelton Pillar. MSW, LCSW Licensed Visual merchandiser [facility] Care Management [phone]

## 2022-12-12 NOTE — Patient Instructions (Signed)
Visit Information  Thank you for taking time to visit with me today. Please don't hesitate to contact me if I can be of assistance to you.   Following are the goals we discussed today:   Goals Addressed             This Visit's Progress    Patient Stated  she is concerned about legal issues and financial issues and ensuring that affairs are in order for her and her spouse       Interventions:  Spoke with [name] via phone today about her current needs and needs of her spouse. Discussed program support for client with RN, LCSW, and Pharmacist Spoke with Madilee about her recent meeting with attorney. She said she and her spouse met with attorney recently and that attorney is helping her with legal matters.She said attorney has sent them documents to review. They are planning to review documents and meet again, as needed, with attorney Discussed recent hospitalization of client Discussed  relaxation techniques. Client and spouse are on trip to beach at present. She is enjoying this trip Encouraged client to call LCSW as needed for SW support at [phone].          Client has name and phone number of LCSW and can call LCSW as needed for SW support  Please call the care guide team at [phone] if you need to cancel or reschedule your appointment.   If you are experiencing a Mental Health or Behavioral Health Crisis or need someone to talk to, please go to [facility] [address], [place] [phone])   The patient verbalized understanding of instructions, educational materials, and care plan provided today and DECLINED offer to receive copy of patient instructions, educational materials, and care plan.   The patient has been provided with contact information for the care management team and has been advised to call with any health related questions or concerns.   Kelton Pillar. MSW, LCSW Licensed Visual merchandiser [facility]  Care Management [phone]

## 2022-12-13 ENCOUNTER — Telehealth: Payer: Self-pay | Admitting: *Deleted

## 2022-12-13 ENCOUNTER — Encounter: Payer: Self-pay | Admitting: *Deleted

## 2022-12-13 NOTE — Transitions of Care (Post Inpatient/ED Visit) (Signed)
   12/13/2022  Name: [name] MRN: [id number] DOB: 02-28-1941  Today's TOC FU Call Status: Today's TOC FU Call Status:: Unsuccessful Call (2nd Attempt) Unsuccessful Call (2nd Attempt) Date: 12/13/22  Attempted to reach the patient regarding the most recent Inpatient visit; left HIPAA compliant voice message requesting call back  Follow Up Plan: Additional outreach attempts will be made to reach the patient to complete the Transitions of Care (Post Inpatient visit) call.   Caryl Pina, RN, BSN, CCRN Alumnus RN CM Care Coordination/ Transition of Care- [facility] Care Management [phone]: direct office

## 2022-12-14 ENCOUNTER — Telehealth: Payer: Self-pay | Admitting: *Deleted

## 2022-12-14 ENCOUNTER — Encounter: Payer: Self-pay | Admitting: *Deleted

## 2022-12-14 DIAGNOSIS — G4733 Obstructive sleep apnea (adult) (pediatric): Secondary | ICD-10-CM | POA: Diagnosis not present

## 2022-12-14 NOTE — Transitions of Care (Post Inpatient/ED Visit) (Signed)
   12/14/2022  Name: YAN DOTTS MRN: [id number] DOB: 07-28-1940  Today's TOC FU Call Status: Today's TOC FU Call Status:: Unsuccessful Call (3rd Attempt) Unsuccessful Call (3rd Attempt) Date: 12/14/22  Attempted to reach the patient regarding the most recent Inpatient visit; left HIPAA compliant voice message requesting call back  Follow Up Plan: No further outreach attempts will be made at this time. We have been unable to contact the patient.  Caryl Pina, RN, BSN, CCRN Alumnus RN CM Care Coordination/ Transition of Care- [facility] Care Management [phone]: direct office

## 2022-12-16 ENCOUNTER — Encounter: Payer: Self-pay | Admitting: Internal Medicine

## 2022-12-16 NOTE — Assessment & Plan Note (Signed)
No relapse Continue on amlodipine, Coreg, Demadex, clonidine Plavix

## 2022-12-16 NOTE — Assessment & Plan Note (Signed)
Follow-up with Dr.Hawkes

## 2022-12-19 DIAGNOSIS — R278 Other lack of coordination: Secondary | ICD-10-CM | POA: Diagnosis not present

## 2022-12-19 DIAGNOSIS — Z9181 History of falling: Secondary | ICD-10-CM | POA: Diagnosis not present

## 2022-12-19 DIAGNOSIS — M6281 Muscle weakness (generalized): Secondary | ICD-10-CM | POA: Diagnosis not present

## 2022-12-20 ENCOUNTER — Encounter (HOSPITAL_COMMUNITY): Payer: Medicare PPO

## 2022-12-25 ENCOUNTER — Telehealth (INDEPENDENT_AMBULATORY_CARE_PROVIDER_SITE_OTHER): Payer: Medicare PPO | Admitting: Nurse Practitioner

## 2022-12-25 ENCOUNTER — Encounter: Payer: Self-pay | Admitting: Nurse Practitioner

## 2022-12-25 VITALS — Temp 98.0°F | Ht 63.0 in | Wt 125.0 lb

## 2022-12-25 DIAGNOSIS — U071 COVID-19: Secondary | ICD-10-CM | POA: Diagnosis not present

## 2022-12-25 MED ORDER — BENZONATATE 100 MG PO CAPS: 100.0000 mg | ORAL_CAPSULE | Freq: Three times a day (TID) | ORAL | 0 refills | Status: DC | PRN

## 2022-12-25 MED ORDER — MOLNUPIRAVIR EUA 200MG CAPSULE: 4.0000 | ORAL_CAPSULE | Freq: Two times a day (BID) | ORAL | 0 refills | Status: AC

## 2022-12-25 NOTE — Assessment & Plan Note (Signed)
She is within the 5-day window of antiviral treatment.  Her GFR is 12.  Will start molnupiravir twice a day for 5 days.  Continue using albuterol inhaler as needed for shortness of breath.  Encourage fluids, rest.  Can take over-the-counter medications.  Will also send in Tessalon 3 times daily as needed cough.  Encouraged her to schedule an appointment in person with her PCP in the next week or 2, sooner if symptoms worsen.  Reviewed home care instructions for COVID. Advised self-isolation at home until fever free for 24 hours without tylenol or ibuprofen and symptoms are starting to feel better. If symptoms, esp, dyspnea develops/worsens, recommend in-person evaluation at either an urgent care or the emergency room.

## 2022-12-25 NOTE — Patient Instructions (Signed)
It was great to see you!  Drink plenty of fluids  Start molnupiravir 4 capsules twice a day for COVID.  Start Tessalon 3 times a day as needed for cough  Follow-up with Dr. [name] in 1 to 2 weeks.  Take care,  Rodman Pickle, NP

## 2022-12-25 NOTE — Progress Notes (Signed)
[facility] [address] [place] [address] Dept: [phone] Dept Fax: [phone]  Virtual Video Visit  I connected with [name] on 12/25/22 at  4:00 PM EDT by a video enabled telemedicine application and verified that I am speaking with the correct person using two identifiers.  Location patient: Home Location provider: Clinic Persons participating in the virtual visit: Patient; Rodman Pickle, NP; Malena Peer, CMA  I discussed the limitations of evaluation and management by telemedicine and the availability of in person appointments. The patient expressed understanding and agreed to proceed.  Chief Complaint  Patient presents with   Covid Positive    Tested positive this morning-exposed by spouse, Coughing, fatigue, some SOB    SUBJECTIVE:  HPI: [name] is a 82 y.o. female who presents with cough, fatigue, and shortness of breath that started Thursday. She had a positive covid test today.   UPPER RESPIRATORY TRACT INFECTION  Fever: no Cough: yes Shortness of breath: yes - when walking Wheezing: no Chest pain: no Chest tightness: no Chest congestion: no Nasal congestion: no Runny nose: yes Post nasal drip: no Sneezing: yes Sore throat: no Swollen glands: no Sinus pressure: no Headache: no Face pain: no Toothache: no Ear pain: no bilateral Ear pressure: no bilateral Eyes red/itching:no Eye drainage/crusting: no  Vomiting: no Diarrhea: yes Rash: no Fatigue: yes Sick contacts: yes - husband had covid last week Strep contacts: no  Context: stable Recurrent sinusitis: no Relief with OTC cold/cough medications: no  Treatments attempted: OTC cold medication     Patient Active Problem List   Diagnosis Date Noted   COVID-19 12/25/2022   GI bleed 12/08/2022   Bleeding hemorrhoids 12/08/2022   Rectal bleeding 11/30/2022   Acute GI bleeding 11/27/2022   Chronic  anticoagulation 11/27/2022   Lower GI bleed 11/27/2022   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   Anxiety 10/02/2022   Severe obstructive sleep apnea 08/24/2022   Nasal bones, closed fracture 08/07/2022   Edema 07/26/2022   Acute gastric ulcer with hemorrhage 07/08/2022   Duodenal ulcer 07/08/2022   Dark stools 07/07/2022   Anemia 07/07/2022   E coli bacteremia 07/04/2022   Acute pyelonephritis 07/03/2022   Acute metabolic encephalopathy 07/03/2022   Hyponatremia 07/03/2022   Sepsis with acute renal failure (HCC) 07/03/2022   DNR (do not resuscitate)/DNI(Do Not Intubate) 07/03/2022   Generalized weakness 05/23/2022   Gallstones 03/17/2022   Acute renal failure superimposed on stage 4 chronic kidney disease (HCC) 03/06/2022   Metabolic acidosis, increased anion gap 03/06/2022   Hip dislocation, left (HCC) 03/06/2022   History of anemia due to chronic kidney disease 03/06/2022   History of DVT (deep vein thrombosis) 03/06/2022   Allergic rhinitis 03/06/2022   History of pulmonary embolism 02/06/2022   Vitamin D deficiency 01/30/2022   Slow transit constipation 01/30/2022   GERD (gastroesophageal reflux disease) 01/30/2022   Status post total replacement of left hip 01/23/2022   Melena    Upper GI bleed 11/17/2021   CKD (chronic kidney disease) stage 4, GFR 15-29 ml/min (HCC) - baseline SCr 2.5. follows with Dr. Malen Gauze with [facility] 11/17/2021   Primary osteoarthritis of left hip 10/06/2021   Aortic atherosclerosis (HCC) 09/07/2020   Neoplasm of uncertain behavior of skin 03/14/2020   Arthralgia 12/25/2019   Ankle swelling 08/08/2019   Osteoporosis 02/26/2018   Colon polyp 06/28/2016   COPD (chronic obstructive pulmonary disease) (HCC) 05/28/2016   Upper airway cough syndrome 05/22/2016  Acute respiratory failure (HCC) 04/15/2016   Wrist pain, right 12/14/2015   Lung mass 03/02/2015   Pre-operative cardiovascular examination 02/21/2015   DOE (dyspnea on exertion)  02/16/2015   TIA (transient ischemic attack) 12/31/2014   Abnormal mental state 12/30/2014   Well adult exam 11/26/2014   Seasonal allergies 12/31/2013   Splenic artery aneurysm (HCC) 04/07/2013   Cerumen impaction 03/20/2013   Hyperlipidemia    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   Anticoagulated 11/08/2010   Rheumatoid arthritis (HCC) 06/23/2010   Pain in joint 11/26/2009   Acquired hypothyroidism 12/08/2006   Macular degeneration (senile) of retina 12/08/2006   Essential hypertension 12/08/2006   Diaphragmatic hernia 12/08/2006   ASPLENIA 12/08/2006   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) 12/22/1994    Class: History of   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; now atretic and significantly improved LAD lesion without intervention 12/22/1994    Class: History of    Past Surgical History:  Procedure Laterality Date   ABDOMINAL AORTIC ANEURYSM REPAIR  1996   Complicated by mesenteric artery stenosis and splenic artery infarction with acquired Asplenia   APPENDECTOMY     BIOPSY  07/08/2022   Procedure: BIOPSY;  Surgeon: Benancio Deeds, MD;  Location: [facility] ENDOSCOPY;  Service: Gastroenterology;;   BIOPSY  11/28/2022   Procedure: BIOPSY;  Surgeon: [name], MD;  Location: [facility] ENDOSCOPY;  Service: Gastroenterology;;   [name]  07/2011   right foot   CARDIAC CATHETERIZATION  2005   (Most recent CATH) - ostial LAD lesion 20-30% (down from 90% initially). Atretic LIMA. Minimal disease the RCA and Circumflex system.   CARPAL TUNNEL RELEASE Left    CATARACT EXTRACTION Bilateral    CERVICAL SPINE SURGERY     plate 1610 Dr. Wynetta Emery   COLONOSCOPY     COLONOSCOPY WITH PROPOFOL N/A 11/28/2022   Procedure: COLONOSCOPY WITH PROPOFOL;  Surgeon: [name], MD;  Location: [facility] ENDOSCOPY;  Service: Gastroenterology;  Laterality: N/A;   CORONARY ARTERY BYPASS GRAFT  1996   INCLUDED AN INTERNAL MAMMARY ARTERY TO  THE LAD. EF WAS NORMAL   ESOPHAGOGASTRODUODENOSCOPY (EGD) WITH PROPOFOL N/A 07/08/2022   Procedure: ESOPHAGOGASTRODUODENOSCOPY (EGD) WITH PROPOFOL;  Surgeon: Benancio Deeds, MD;  Location: [facility] ENDOSCOPY;  Service: Gastroenterology;  Laterality: N/A;   ESOPHAGOGASTRODUODENOSCOPY (EGD) WITH PROPOFOL N/A 11/28/2022   Procedure: ESOPHAGOGASTRODUODENOSCOPY (EGD) WITH PROPOFOL;  Surgeon: [name], MD;  Location: [facility] ENDOSCOPY;  Service: Gastroenterology;  Laterality: N/A;   INGUINAL HERNIA REPAIR Right    LAPAROSCOPIC APPENDECTOMY N/A 06/28/2016   Procedure: APPENDECTOMY LAPAROSCOPIC;  Surgeon: [name], MD;  Location: [facility] ORS;  Service: General;  Laterality: N/A;   NM MYOVIEW LTD  06/2010   Fixed anteroseptal, apical and inferoapical defect with moderate size. Most likely scar. Mild subendocardial ischemia. EF 71% LOW RISK.    POLYPECTOMY  11/28/2022   Procedure: POLYPECTOMY;  Surgeon: [name], MD;  Location: [facility] ENDOSCOPY;  Service: Gastroenterology;;   SPLENECTOMY     TOTAL HIP ARTHROPLASTY Left 01/23/2022   Procedure: LEFT TOTAL HIP ARTHROPLASTY ANTERIOR APPROACH;  Surgeon: [name], MD;  Location: [facility] OR;  Service: Orthopedics;  Laterality: Left;  3-C   TRANSTHORACIC ECHOCARDIOGRAM  12/2014   [facility]: Normal LV size & function. EF 55-60%,    vagina polyp     VIDEO ASSISTED THORACOSCOPY (VATS)/WEDGE RESECTION Left 03/02/2015   Procedure: VIDEO ASSISTED THORACOSCOPY (VATS), MINI  THORACOTOMY, LEFT UPPER LOBE WEDGE, TAKE DOWN OF INTERNAL MAMMARY LESIONS, PLACEMENT OF ON-Q PUMP;  Surgeon: [name], MD;  Location: [facility] OR;  Service: Thoracic;  Laterality: Left;   VIDEO BRONCHOSCOPY N/A 03/02/2015   Procedure: BRONCHOSCOPY;  Surgeon: [name], MD;  Location: [facility] OR;  Service: Thoracic;  Laterality: N/A;   VIDEO BRONCHOSCOPY WITH ENDOBRONCHIAL NAVIGATION N/A 10/08/2017   Procedure: VIDEO BRONCHOSCOPY WITH ENDOBRONCHIAL NAVIGATION WITH BIOPSIES OF LEFT  UPPER LOBE AND LEFT LOWER LOBE;  Surgeon: [name], MD;  Location: [facility] OR;  Service: Thoracic;  Laterality: N/A;    Family History  Problem Relation Age of Onset   Hypertension Mother    Diabetes Mother    Heart disease Mother    Hyperlipidemia Mother    Stroke Father    Hyperlipidemia Sister    Hypertension Sister    Hypertension Daughter    Cancer Paternal Uncle        Deceased from cancer not sure of site   Hypertension Son    Hyperlipidemia Son    Hyperlipidemia Son    Hypertension Son    Hyperlipidemia Son    Hypertension Son    Hypertension Other    Coronary artery disease Other    Asthma Neg Hx    Colon cancer Neg Hx     Social History   Tobacco Use   Smoking status: Former    Current packs/day: 0.00    Types: Cigarettes    Quit date: 12/04/1958    Years since quitting: 64.1    Passive exposure: Never   Smokeless tobacco: Never  Vaping Use   Vaping status: Never Used  Substance Use Topics   Alcohol use: Yes    Comment: hardly ever   Drug use: No     Current Outpatient Medications:    acetaminophen (TYLENOL) 325 MG tablet, Take 2 tablets (650 mg total) by mouth 2 (two) times daily as needed (generalized pain)., Disp: 100 tablet, Rfl: 2   albuterol (VENTOLIN HFA) 108 (90 Base) MCG/ACT inhaler, Inhale 1-2 puffs into the lungs every 4 (four) hours as needed for wheezing or shortness of breath., Disp: 1 each, Rfl: 2   amLODipine (NORVASC) 10 MG tablet, Take 1 tablet (10 mg total) by mouth daily., Disp: 30 tablet, Rfl: 0   ascorbic acid (VITAMIN C) 500 MG tablet, Take 1 tablet (500 mg total) by mouth daily., Disp: 30 tablet, Rfl: 2   atorvastatin (LIPITOR) 80 MG tablet, Take 1 tablet (80 mg total) by mouth daily., Disp: 90 tablet, Rfl: 1   benzonatate (TESSALON) 100 MG capsule, Take 1 capsule (100 mg total) by mouth 3 (three) times daily as needed for cough., Disp: 30 capsule, Rfl: 0   carvedilol (COREG) 6.25 MG tablet, Take 6.25 mg by mouth 2 (two) times  daily with a meal., Disp: , Rfl:    Cholecalciferol (VITAMIN D3) 50 MCG (2000 UT) capsule, Take 1 capsule (2,000 Units total) by mouth daily., Disp: 100 capsule, Rfl: 3   cloNIDine (CATAPRES - DOSED IN MG/24 HR) 0.1 mg/24hr patch, Place 2 patches (0.2 mg total) onto the skin once a week. Applies every Sunday., Disp: , Rfl:    cloNIDine (CATAPRES) 0.2 MG tablet, Take 1 tablet (0.2 mg total) by mouth 4 (four) times daily as needed for up to 30 doses. For systolic blood pressure (top number) over 200 mmhg (Patient taking differently: Take 0.2 mg by mouth 4 (four) times daily as needed (For systolic blood pressure (top  number) over 200 mmhg).), Disp: 100 tablet, Rfl: 3   Cyanocobalamin 2500 MCG TABS, Take 2,500 mcg by mouth daily., Disp: 30 tablet, Rfl: 3   Fexofenadine HCl (ALLERGY 24-HR PO), Take 1 tablet by mouth in the morning and at bedtime., Disp: , Rfl:    leflunomide (ARAVA) 20 MG tablet, Take 1 tablet (20 mg total) by mouth daily., Disp: 30 tablet, Rfl: 1   levothyroxine (SYNTHROID) 88 MCG tablet, Take 1 tablet (88 mcg total) by mouth daily before breakfast., Disp: 90 tablet, Rfl: 1   molnupiravir EUA (LAGEVRIO) 200 mg CAPS capsule, Take 4 capsules (800 mg total) by mouth 2 (two) times daily for 5 days., Disp: 40 capsule, Rfl: 0   Multiple Vitamins-Minerals (CENTRUM ADULT PO), Take 1 tablet by mouth daily. 1 Tablet Daily., Disp: , Rfl:    pantoprazole (PROTONIX) 40 MG tablet, Take 1 tablet (40 mg total) by mouth daily., Disp: 180 tablet, Rfl: 1   sertraline (ZOLOFT) 50 MG tablet, Take 0.5 tablets (25 mg total) by mouth daily., Disp: , Rfl:    torsemide (DEMADEX) 20 MG tablet, Take 20 mg by mouth See admin instructions. Take 20 mg (1 tablet) by mouth twice a week on Tuesday's and Saturday's., Disp: , Rfl:    triamcinolone (NASACORT) 55 MCG/ACT AERO nasal inhaler, Place 1 spray into the nose daily., Disp: 1 each, Rfl: 3  Allergies  Allergen Reactions   Nsaids Other (See Comments)    Stomach  upset Told to avoid due to Eliquis   Aspirin Other (See Comments)    Stomach upset Told to avoid due to Eliquis   Codeine Nausea And Vomiting   Nitrostat [Nitroglycerin] Other (See Comments)    Bradycardia. Drop in heart rate     ROS: See pertinent positives and negatives per HPI.  OBSERVATIONS/OBJECTIVE:  VITALS per patient if applicable: Today's Vitals   12/25/22 1518  Temp: 98 F (36.7 C)  Weight: 125 lb (56.7 kg)  Height: 5\' 3"  (1.6 m)   Body mass index is 22.14 kg/m.    GENERAL: Alert and oriented. Appears well and in no acute distress.  HEENT: Atraumatic. Conjunctiva clear. No obvious abnormalities on inspection of external nose and ears.  NECK: Normal movements of the head and neck.  LUNGS: On inspection, no signs of respiratory distress. Breathing rate appears normal. No obvious gross SOB, gasping or wheezing, and no conversational dyspnea.  CV: No obvious cyanosis.  MS: Moves all visible extremities without noticeable abnormality.  PSYCH/NEURO: Pleasant and cooperative. No obvious depression or anxiety. Speech and thought processing grossly intact.  ASSESSMENT AND PLAN:  Problem List Items Addressed This Visit       Other   COVID-19 - Primary    She is within the 5-day window of antiviral treatment.  Her GFR is 12.  Will start molnupiravir twice a day for 5 days.  Continue using albuterol inhaler as needed for shortness of breath.  Encourage fluids, rest.  Can take over-the-counter medications.  Will also send in Tessalon 3 times daily as needed cough.  Encouraged her to schedule an appointment in person with her PCP in the next week or 2, sooner if symptoms worsen.  Reviewed home care instructions for COVID. Advised self-isolation at home until fever free for 24 hours without tylenol or ibuprofen and symptoms are starting to feel better. If symptoms, esp, dyspnea develops/worsens, recommend in-person evaluation at either an urgent care or the emergency  room.       Relevant Medications  molnupiravir EUA (LAGEVRIO) 200 mg CAPS capsule     I discussed the assessment and treatment plan with the patient. The patient was provided an opportunity to ask questions and all were answered. The patient agreed with the plan and demonstrated an understanding of the instructions.   The patient was advised to call back or seek an in-person evaluation if the symptoms worsen or if the condition fails to improve as anticipated.   Gerre Scull, NP

## 2022-12-28 ENCOUNTER — Encounter: Payer: Self-pay | Admitting: Internal Medicine

## 2022-12-28 ENCOUNTER — Encounter (HOSPITAL_COMMUNITY): Payer: Medicare PPO

## 2022-12-28 DIAGNOSIS — M6281 Muscle weakness (generalized): Secondary | ICD-10-CM | POA: Diagnosis not present

## 2022-12-28 DIAGNOSIS — Z9181 History of falling: Secondary | ICD-10-CM | POA: Diagnosis not present

## 2022-12-28 DIAGNOSIS — R278 Other lack of coordination: Secondary | ICD-10-CM | POA: Diagnosis not present

## 2022-12-29 ENCOUNTER — Other Ambulatory Visit: Payer: Self-pay

## 2022-12-29 DIAGNOSIS — I10 Essential (primary) hypertension: Secondary | ICD-10-CM

## 2022-12-29 MED ORDER — AMLODIPINE BESYLATE 10 MG PO TABS: 10.0000 mg | ORAL_TABLET | Freq: Every day | ORAL | 3 refills | Status: DC

## 2023-01-02 ENCOUNTER — Encounter (HOSPITAL_COMMUNITY)
Admission: RE | Admit: 2023-01-02 | Discharge: 2023-01-02 | Disposition: A | Discharge status: Home / Self Care | Payer: Medicare PPO | Source: Ambulatory Visit | Attending: Nephrology | Admitting: Nephrology

## 2023-01-02 VITALS — BP 151/62 | HR 77 | Temp 97.7°F | Resp 17

## 2023-01-02 DIAGNOSIS — N184 Chronic kidney disease, stage 4 (severe): Secondary | ICD-10-CM | POA: Diagnosis not present

## 2023-01-02 DIAGNOSIS — R2681 Unsteadiness on feet: Secondary | ICD-10-CM | POA: Diagnosis not present

## 2023-01-02 DIAGNOSIS — R278 Other lack of coordination: Secondary | ICD-10-CM | POA: Diagnosis not present

## 2023-01-02 DIAGNOSIS — M6281 Muscle weakness (generalized): Secondary | ICD-10-CM | POA: Diagnosis not present

## 2023-01-02 DIAGNOSIS — U071 COVID-19: Secondary | ICD-10-CM | POA: Diagnosis not present

## 2023-01-02 DIAGNOSIS — Z9181 History of falling: Secondary | ICD-10-CM | POA: Diagnosis not present

## 2023-01-02 LAB — FERRITIN: Ferritin: 419 ng/mL — ABNORMAL HIGH (ref 11–307)

## 2023-01-02 LAB — IRON AND TIBC
Iron: 100 ug/dL (ref 28–170)
Saturation Ratios: 37 % — ABNORMAL HIGH (ref 10.4–31.8)
TIBC: 270 ug/dL (ref 250–450)
UIBC: 170 ug/dL

## 2023-01-02 LAB — POCT HEMOGLOBIN-HEMACUE: Hemoglobin: 8.7 g/dL — ABNORMAL LOW (ref 12.0–15.0)

## 2023-01-02 MED ORDER — EPOETIN ALFA-EPBX 10000 UNIT/ML IJ SOLN
INTRAMUSCULAR | Status: AC
  Filled 2023-01-02: qty 2

## 2023-01-02 MED ORDER — EPOETIN ALFA-EPBX 10000 UNIT/ML IJ SOLN
20000.0000 [IU] | INTRAMUSCULAR | Status: DC
  Administered 2023-01-02: 20000 [IU] via SUBCUTANEOUS

## 2023-01-03 ENCOUNTER — Encounter (HOSPITAL_COMMUNITY): Payer: Medicare PPO

## 2023-01-04 DIAGNOSIS — R4789 Other speech disturbances: Secondary | ICD-10-CM | POA: Diagnosis not present

## 2023-01-04 DIAGNOSIS — M6281 Muscle weakness (generalized): Secondary | ICD-10-CM | POA: Diagnosis not present

## 2023-01-04 DIAGNOSIS — R278 Other lack of coordination: Secondary | ICD-10-CM | POA: Diagnosis not present

## 2023-01-04 DIAGNOSIS — U071 COVID-19: Secondary | ICD-10-CM | POA: Diagnosis not present

## 2023-01-04 DIAGNOSIS — J449 Chronic obstructive pulmonary disease, unspecified: Secondary | ICD-10-CM | POA: Diagnosis not present

## 2023-01-04 DIAGNOSIS — Z9181 History of falling: Secondary | ICD-10-CM | POA: Diagnosis not present

## 2023-01-05 DIAGNOSIS — M6281 Muscle weakness (generalized): Secondary | ICD-10-CM | POA: Diagnosis not present

## 2023-01-05 DIAGNOSIS — R2681 Unsteadiness on feet: Secondary | ICD-10-CM | POA: Diagnosis not present

## 2023-01-05 DIAGNOSIS — R278 Other lack of coordination: Secondary | ICD-10-CM | POA: Diagnosis not present

## 2023-01-06 ENCOUNTER — Encounter: Payer: Self-pay | Admitting: Internal Medicine

## 2023-01-08 ENCOUNTER — Other Ambulatory Visit: Payer: Self-pay

## 2023-01-08 DIAGNOSIS — U071 COVID-19: Secondary | ICD-10-CM | POA: Diagnosis not present

## 2023-01-08 DIAGNOSIS — I10 Essential (primary) hypertension: Secondary | ICD-10-CM

## 2023-01-08 DIAGNOSIS — R4789 Other speech disturbances: Secondary | ICD-10-CM | POA: Diagnosis not present

## 2023-01-08 DIAGNOSIS — N185 Chronic kidney disease, stage 5: Secondary | ICD-10-CM | POA: Diagnosis not present

## 2023-01-08 DIAGNOSIS — J449 Chronic obstructive pulmonary disease, unspecified: Secondary | ICD-10-CM | POA: Diagnosis not present

## 2023-01-09 DIAGNOSIS — N185 Chronic kidney disease, stage 5: Secondary | ICD-10-CM | POA: Diagnosis not present

## 2023-01-09 DIAGNOSIS — Z9181 History of falling: Secondary | ICD-10-CM | POA: Diagnosis not present

## 2023-01-09 DIAGNOSIS — R278 Other lack of coordination: Secondary | ICD-10-CM | POA: Diagnosis not present

## 2023-01-09 DIAGNOSIS — M6281 Muscle weakness (generalized): Secondary | ICD-10-CM | POA: Diagnosis not present

## 2023-01-09 DIAGNOSIS — U071 COVID-19: Secondary | ICD-10-CM | POA: Diagnosis not present

## 2023-01-10 ENCOUNTER — Encounter: Payer: Self-pay | Admitting: Nurse Practitioner

## 2023-01-10 ENCOUNTER — Ambulatory Visit: Payer: Medicare PPO | Admitting: Nurse Practitioner

## 2023-01-10 VITALS — BP 122/74 | HR 82 | Ht 63.0 in | Wt 129.4 lb

## 2023-01-10 DIAGNOSIS — U071 COVID-19: Secondary | ICD-10-CM | POA: Diagnosis not present

## 2023-01-10 DIAGNOSIS — R278 Other lack of coordination: Secondary | ICD-10-CM | POA: Diagnosis not present

## 2023-01-10 DIAGNOSIS — G4733 Obstructive sleep apnea (adult) (pediatric): Secondary | ICD-10-CM

## 2023-01-10 DIAGNOSIS — M6281 Muscle weakness (generalized): Secondary | ICD-10-CM | POA: Diagnosis not present

## 2023-01-10 DIAGNOSIS — R4789 Other speech disturbances: Secondary | ICD-10-CM | POA: Diagnosis not present

## 2023-01-10 DIAGNOSIS — J449 Chronic obstructive pulmonary disease, unspecified: Secondary | ICD-10-CM | POA: Diagnosis not present

## 2023-01-10 DIAGNOSIS — R2681 Unsteadiness on feet: Secondary | ICD-10-CM | POA: Diagnosis not present

## 2023-01-10 NOTE — Progress Notes (Signed)
@Patient  ID: [name], female    DOB: [birth year], 82 y.o.   MRN: [id number]  Chief Complaint  Patient presents with   Follow-up    Cpap f/u     Referring provider: Plotnikov, Georgina Quint, MD  HPI: 82 year old female, former smoker followed for mild COPD and upper airway cough. She is a patient of Dr. Thurston Hole and last seen in office 10/2022. Past medical history significant for splenic artery aneurysm, TIA, HTN, mesenteric artery stenosis, CAD s/p CABG, ruptured AAA s/p repair, diaphragmatic hernia, allergic rhinitis, OSA not on CPAP, GERD, hypothyroid, RA, CKD.  TEST/EVENTS:  08/2019 PFT: FVC 96, FEV1 87, ratio 71, TLC 148, DLCOunc 98. Positive BD 02/07/2022 echo: EF 65-70%, severe LVH. GIIDD. RV size and function. Normal PASP. Trivial MR.  07/15/2022 CXR: b/l lower lobe airspace opacities, increased from prior. Small b/l effusions.  10/09/2022 HST: AHI 60/hr, SpO2 low 70%  11/07/2021: OV with Dr. Sherene Sires. Maintained on Stiolto and gabapentin. Limited mobility due to hip - plans for L THR. Surgical evaluation completed. Follow up yearly or as needed. Walk test without desaturations.   09/26/2022: Ov with Rasheda Ledger NP for follow up. She was hospitalized in March for sepsis due to e coli pyelonephritis with e coli bacteremia and acute hypoxic respiratory failure from 07/02/2022-07/25/2022. Hypoxia was felt to be related to atelectasis and improved with pulmonary toileting. She was discharged to rehab on room air. However while there, she was noted to stop breathing when she was sleeping at night and had some low O2 readings so she was restarted on supplemental oxygen. She was set up for a home sleep study but once she was discharged, they canceled this appointment. She has just been using oxygen when she sleeps at night. Her O2 levels during the day have been above 90% on room air. She does not have any trouble with her breathing. Gabapentin was stopped in rehab and cough has not returned. She is feeling  better. She is not currently on any maintenance inhalers. Has not felt like she's needed her rescue inhaler but does have on available.  She tells me she continues to have daytime tiredness, which has been ongoing for years. She does feel like this got worse after her hospitalization but seems to be back to her normal now. She snores at night. She did have some witnessed apneic events at rehab while sleeping. She takes a nap most days. Tends to doze if she's sitting in a recliner. She doesn't feel like she rests well. She does not drive.  Denies sleep parasomnias/paralysis. She gets around 7-8 hours of sleep a night. Wakes a few times to use the restroom. No sleep aids. No excessive caffeine intake.   11/01/2022: OV with Harlo Fabela NP for follow up to discuss sleep study results, which revealed severe OSA. She continues to struggle with poor sleep quality and feeling tired during the day. Often naps in her recliner. Wakes feeling poorly rested. She feels like her breathing is the same as it was her last visit. Doesn't really notice any difficulties with it. She hasn't had to use her oxygen during the day. Still wearing it at night. Did not wear it the night of her sleep study. She does not drive. No sleep parasomnias/paralysis.   11/10/2022: OV with Dr. Sherene Sires. No maintenance inhaler. Able to walk from her apartment to the dining room without trouble with her breathing. Does have some fatigue. CPAP titration planned for 8/1.   01/10/2023: Today -  follow up Patient presents today for follow up. She was supposed to undergo CPAP titration 8/1 but had to cancel this as she was hospitalized the days leading up to her study for acute GI bleed. She then had another admission in August. She also had COVID mid August. Mild illness. Has been doing well since. She is using CPAP nightly. Feels like she's getting better rest with this. Doesn't feel quite as fatigued during the day. She was having a lot of trouble with her mask  leaking when she was wearing a full face mask. Switched to a nasal pillow mask and this seems to fit better. Leaks aren't waking her up. She does not drive. No sleep parasomnias/paralysis.  Breathing doing well. No complaints. No albuterol use. Returned her oxygen. Hasn't noticed any low levels at home.   12/11/2022-01/09/2023: CPAP 5-20 cmH2O 25/30 days; 77% >4 hr; average use 6 hr 30 min Pressure 95th 11.7 Leaks 95th 32.4 AHI 0.8  Allergies  Allergen Reactions   Nsaids Other (See Comments)    Stomach upset Told to avoid due to Eliquis   Aspirin Other (See Comments)    Stomach upset Told to avoid due to Eliquis   Codeine Nausea And Vomiting   Nitrostat [Nitroglycerin] Other (See Comments)    Bradycardia. Drop in heart rate     Immunization History  Administered Date(s) Administered   Covid-19, Mrna,Vaccine(Spikevax)45yrs and older 06/09/2022   Fluad Quad(high Dose 65+) 02/03/2021, 01/16/2022   Influenza Whole 05/02/2001, 01/27/2008, 01/12/2010, 12/31/2010, 01/25/2012   Influenza, High Dose Seasonal PF 01/14/2014, 02/13/2015, 12/21/2016, 01/09/2018, 12/29/2018   Influenza,inj,Quad PF,6+ Mos 12/23/2015   Influenza-Unspecified 02/03/2013, 01/13/2014, 02/13/2015, 01/20/2020   Meningococcal polysaccharide vaccine (MPSV4) 03/21/2012   Moderna SARS-COV2 Booster Vaccination 03/15/2020   Moderna Sars-Covid-2 Vaccination 06/02/2019, 06/30/2019   PPD Test 11/18/2013   Pfizer Covid-19 Vaccine Bivalent Booster 88yrs & up 01/19/2021, 10/12/2021   Pneumococcal Conjugate-13 03/20/2013   Pneumococcal Polysaccharide-23 05/02/2000, 06/23/2010, 02/26/2018   Td 05/01/2006   Tdap 06/01/2016   Zoster Recombinant(Shingrix) 05/16/2017, 07/20/2017   Zoster, Live 01/17/2006    Past Medical History:  Diagnosis Date   Abdominal aortic aneurysm (HCC)    REPAIRED IN 1996 BY [name]  AND HAS RECENTLY BEEN FOLLOWED BY [name]   Acquired asplenia     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 Most likely scar. Mild subendocardial ischemia. EF 71% LOW RISK.    Cervical disc disease    fracture   Chronic kidney disease    CKD4   COPD (chronic obstructive pulmonary disease) (HCC)    Diverticulosis    DVT (deep venous thrombosis) (HCC)    RLE; Sept '23   History of DVT (deep vein thrombosis) 03/06/2022   History of pulmonary embolus (PE)    Oct '23   Hyperlipidemia    Hypertension    Hypothyroidism (acquired)    hypo   Myocardial infarction (HCC) 11/2014   TIA   Polymyalgia rheumatica (HCC)  2011 Dr. Kellie Simmering   Pulmonary emboli [facility]) 02/06/2022   01/2022 PE/DVT post-op. Lower GI bleed     Treat edema  Start Eliquis at the reduced dose due to Creat>1.5 and age >27   Rheumatoid arthritis (HCC) 2011   Dr.Truslow; fracture knees, hands and wrists -    S/P CABG x 1 1996   CABG--LIMA-LAD for ostial LAD (not felt to be PCI amenable). EF NORMAL then; LIMA now atretic   Shortness of breath dyspnea    with exertion   Stroke (HCC) 11/2014   TIA    Urinary frequency     Tobacco History: Social History   Tobacco Use  Smoking Status Former   Current packs/day: 0.00   Types: Cigarettes   Quit date: 12/04/1958   Years since quitting: 64.1   Passive exposure: Never  Smokeless Tobacco Never   Counseling given: Not Answered   Outpatient Medications Prior to Visit  Medication Sig Dispense Refill   acetaminophen (TYLENOL) 325 MG tablet Take 2 tablets (650 mg total) by mouth 2 (two) times daily as  needed (generalized pain). 100 tablet 2   albuterol (VENTOLIN HFA) 108 (90 Base) MCG/ACT inhaler Inhale 1-2 puffs into the lungs every 4 (four) hours as needed for wheezing or shortness of breath. 1 each 2   amLODipine (NORVASC) 10 MG tablet Take 1 tablet (10 mg total) by mouth daily. 90 tablet 3   ascorbic acid (VITAMIN C) 500 MG tablet Take 1 tablet (500 mg total) by mouth daily. 30 tablet 2   atorvastatin (LIPITOR) 80 MG tablet Take 1 tablet (80 mg total) by mouth daily. 90 tablet 1   benzonatate (TESSALON) 100 MG capsule Take 1 capsule (100 mg total) by mouth 3 (three) times daily as needed for cough. 30 capsule 0   carvedilol (COREG) 6.25 MG tablet Take 6.25 mg by mouth 2 (two) times daily with a meal.     Cholecalciferol (VITAMIN D3) 50 MCG (2000 UT) capsule Take 1 capsule (2,000 Units total) by mouth daily. 100 capsule 3   cloNIDine (CATAPRES) 0.2 MG tablet Take 1 tablet (0.2 mg total) by mouth 4 (four) times daily as needed for up to 30 doses. For systolic blood pressure (top number) over 200 mmhg (Patient taking differently: Take 0.2 mg by mouth 4 (four) times daily as needed (For systolic blood pressure (top number) over 200 mmhg).) 100 tablet 3   Cyanocobalamin 2500 MCG TABS Take 2,500 mcg by mouth daily. 30 tablet 3   Fexofenadine HCl (ALLERGY 24-HR PO) Take 1 tablet by mouth in the morning and at bedtime.     leflunomide (ARAVA) 20 MG tablet Take 1 tablet (20 mg total) by mouth daily. 30 tablet 1   levothyroxine (SYNTHROID) 88 MCG tablet Take 1 tablet (88 mcg total) by mouth daily before breakfast. 90 tablet 1   Multiple Vitamins-Minerals (CENTRUM ADULT PO) Take 1 tablet by mouth daily. 1 Tablet Daily.     pantoprazole (PROTONIX) 40 MG tablet Take 1 tablet (40 mg total) by mouth daily. 180 tablet 1   sertraline (ZOLOFT) 50 MG tablet Take 0.5 tablets (25 mg total) by mouth daily.     torsemide (DEMADEX) 20 MG tablet Take 20 mg by mouth See admin instructions. Take 20 mg (1 tablet) by  mouth twice a week on Tuesday's and Saturday's.     triamcinolone (NASACORT) 55 MCG/ACT AERO nasal inhaler Place 1 spray into the nose daily. 1 each 3   cloNIDine (CATAPRES - DOSED IN MG/24  HR) 0.1 mg/24hr patch Place 2 patches (0.2 mg total) onto the skin once a week. Applies every Sunday. (Patient not taking: Reported on 01/10/2023)     No facility-administered medications prior to visit.     Review of Systems:   Constitutional: No weight loss or gain, night sweats, fevers, chills,or lassitude.  +fatigue (improved) HEENT: No headaches, difficulty swallowing, tooth/dental problems, or sore throat. No sneezing, itching, ear ache, nasal congestion, or post nasal drip CV:  No chest pain, orthopnea, PND, swelling in lower extremities, anasarca, dizziness, palpitations, syncope Resp: No shortness of breath with exertion or at rest. No excess mucus or change in color of mucus. No productive or non-productive. No hemoptysis. No wheezing.  No chest wall deformity GI:  No heartburn, indigestion, abdominal pain, nausea, vomiting, diarrhea, change in bowel habits, loss of appetite, bloody stools.  GU: No dysuria, change in color of urine, urgency or frequency.   Skin: No rash, lesions, ulcerations MSK:  No joint pain or swelling.   Neuro: No dizziness or lightheadedness.  Psych: No depression or anxiety. Mood stable. +sleep disturbance (improved)    Physical Exam:  BP 122/74   Pulse 82   Ht 5\' 3"  (1.6 m)   Wt 129 lb 6.4 oz (58.7 kg)   SpO2 95%   BMI 22.92 kg/m   GEN: Pleasant, interactive, well-kempt; in no acute distress. HEENT:  Normocephalic and atraumatic. PERRLA. Sclera white. Nasal turbinates pink, moist and patent bilaterally. No rhinorrhea present. Oropharynx pink and moist, without exudate or edema. No lesions, ulcerations, or postnasal drip. Mallampati II/III NECK:  Supple w/ fair ROM. No JVD present. Normal carotid impulses w/o bruits. Thyroid symmetrical with no goiter or nodules  palpated. No lymphadenopathy.   CV: RRR, no m/r/g, no peripheral edema. Pulses intact, +2 bilaterally. No cyanosis, pallor or clubbing. PULMONARY:  Unlabored, regular breathing. Clear bilaterally A&P w/o wheezes/rales/rhonchi. No accessory muscle use.  GI: BS present and normoactive. Soft, non-tender to palpation. No organomegaly or masses detected.  MSK: No erythema, warmth or tenderness. Cap refil <2 sec all extrem. No deformities or joint swelling noted.  Neuro: A/Ox3. No focal deficits noted.   Skin: Warm, no lesions or rashe Psych: Normal affect and behavior. Judgement and thought content appropriate.     Lab Results:  CBC    Component Value Date/Time   WBC 10.3 12/10/2022 0135   RBC 2.60 (L) 12/10/2022 0135   HGB 8.7 (L) 01/02/2023 0916   HCT 26.4 (L) 12/10/2022 0135   PLT 354 12/10/2022 0135   MCV 101.5 (H) 12/10/2022 0135   MCV 90.9 01/29/2015 1524   MCH 31.2 12/10/2022 0135   MCHC 30.7 12/10/2022 0135   RDW 17.0 (H) 12/10/2022 0135   LYMPHSABS 2.0 11/27/2022 0105   MONOABS 1.4 (H) 11/27/2022 0105   EOSABS 0.4 11/27/2022 0105   BASOSABS 0.2 (H) 11/27/2022 0105    BMET    Component Value Date/Time   NA 138 12/09/2022 0150   NA 141 08/01/2022 0800   K 4.5 12/09/2022 0150   CL 104 12/09/2022 0150   CO2 21 (L) 12/09/2022 0150   GLUCOSE 114 (H) 12/09/2022 0150   BUN 62 (H) 12/09/2022 0150   BUN 57 (A) 08/01/2022 0800   CREATININE 3.54 (H) 12/09/2022 0150   CREATININE 2.19 (H) 12/26/2019 1005   CALCIUM 9.0 12/09/2022 0150   GFRNONAA 12 (L) 12/09/2022 0150   GFRNONAA 20 (L) 12/26/2019 1005   GFRAA 24 (L) 12/26/2019 1005    BNP  Component Value Date/Time   BNP 865.3 (H) 07/12/2022 0826   BNP 445 (H) 08/08/2019 1538     Imaging:  No results found.  Administration History     None          Latest Ref Rng & Units 09/18/2019    8:47 AM 01/14/2018    9:00 AM 02/19/2015    9:43 AM  PFT Results  FVC-Pre L 2.35  2.29  2.56   FVC-Predicted Pre % 96   91  97   FVC-Post L 2.57  2.47  2.68   FVC-Predicted Post % 105  98  101   Pre FEV1/FVC % % 67  62  62   Post FEV1/FCV % % 71  68  66   FEV1-Pre L 1.58  1.42  1.58   FEV1-Predicted Pre % 87  76  80   FEV1-Post L 1.83  1.68  1.78   DLCO uncorrected ml/min/mmHg 17.91  19.35  16.27   DLCO UNC% % 98  84  71   DLCO corrected ml/min/mmHg 17.91     DLCO COR %Predicted % 98     DLVA Predicted % 91  90  78   TLC L 7.29  5.45  5.41   TLC % Predicted % 148  111  110   RV % Predicted % 206  136  123     No results found for: "NITRICOXIDE"      Assessment & Plan:   Severe obstructive sleep apnea Severe OSA on CPAP. Good compliance on download. Missed days due to hospitalization and then COVID illness. Excellent control. Receiving benefit from use. Will tighten her settings to 8-15 cmH2O to see if we can cut down on leaks. She still needs CPAP titration study to ensure nocturnal hypoxia corrected with PAP use and she does not need to resume supplemental O2. Will reach out to [facility] to get her rescheduled. Aware of proper use/care of device.   Patient Instructions  Continue Albuterol inhaler 2 puffs every 6 hours as needed for shortness of breath or wheezing. Notify if symptoms persist despite rescue inhaler/neb use or if you are having to use this frequently Monitor oxygen levels at home for goal >88-90%.    Continue CPAP every night, minimum of 4-6 hours a night.  Change equipment as directed. Wash your tubing with warm soap and water daily, hang to dry. Wash humidifier portion weekly. Use bottled, distilled water and change daily   Be aware of reduced alertness and do not drive or operate heavy machinery if experiencing this or drowsiness.  Notify if persistent daytime sleepiness occurs even with consistent use of CPAP.   Change CPAP settings to 8-15 cmH2O   Someone should call you in the next week or two to reschedule the CPAP titration study since you had to cancel the last one     Follow up in 10-12 weeks with Dr. Wynona Neat or Philis Nettle. If symptoms do not improve or worsen, please contact office for sooner follow up or seek emergency care.     I spent 35 minutes of dedicated to the care of this patient on the date of this encounter to include pre-visit review of records, face-to-face time with the patient discussing conditions above, post visit ordering of testing, clinical documentation with the electronic health record, making appropriate referrals as documented, and communicating necessary findings to members of the patients care team.  Noemi Chapel, NP 01/10/2023  Pt aware and understands NP's role.

## 2023-01-10 NOTE — Patient Instructions (Addendum)
Continue Albuterol inhaler 2 puffs every 6 hours as needed for shortness of breath or wheezing. Notify if symptoms persist despite rescue inhaler/neb use or if you are having to use this frequently Monitor oxygen levels at home for goal >88-90%.    Continue CPAP every night, minimum of 4-6 hours a night.  Change equipment as directed. Wash your tubing with warm soap and water daily, hang to dry. Wash humidifier portion weekly. Use bottled, distilled water and change daily   Be aware of reduced alertness and do not drive or operate heavy machinery if experiencing this or drowsiness.  Notify if persistent daytime sleepiness occurs even with consistent use of CPAP.   Change CPAP settings to 8-15 cmH2O   Someone should call you in the next week or two to reschedule the CPAP titration study since you had to cancel the last one    Follow up in 10-12 weeks with Dr. Wynona Neat or Philis Nettle. If symptoms do not improve or worsen, please contact office for sooner follow up or seek emergency care.

## 2023-01-10 NOTE — Assessment & Plan Note (Signed)
Severe OSA on CPAP. Good compliance on download. Missed days due to hospitalization and then COVID illness. Excellent control. Receiving benefit from use. Will tighten her settings to 8-15 cmH2O to see if we can cut down on leaks. She still needs CPAP titration study to ensure nocturnal hypoxia corrected with PAP use and she does not need to resume supplemental O2. Will reach out to [facility] to get her rescheduled. Aware of proper use/care of device.   Patient Instructions  Continue Albuterol inhaler 2 puffs every 6 hours as needed for shortness of breath or wheezing. Notify if symptoms persist despite rescue inhaler/neb use or if you are having to use this frequently Monitor oxygen levels at home for goal >88-90%.    Continue CPAP every night, minimum of 4-6 hours a night.  Change equipment as directed. Wash your tubing with warm soap and water daily, hang to dry. Wash humidifier portion weekly. Use bottled, distilled water and change daily   Be aware of reduced alertness and do not drive or operate heavy machinery if experiencing this or drowsiness.  Notify if persistent daytime sleepiness occurs even with consistent use of CPAP.   Change CPAP settings to 8-15 cmH2O   Someone should call you in the next week or two to reschedule the CPAP titration study since you had to cancel the last one    Follow up in 10-12 weeks with Dr. Wynona Neat or Philis Nettle. If symptoms do not improve or worsen, please contact office for sooner follow up or seek emergency care.

## 2023-01-11 ENCOUNTER — Other Ambulatory Visit: Payer: Self-pay | Admitting: Internal Medicine

## 2023-01-11 DIAGNOSIS — M6281 Muscle weakness (generalized): Secondary | ICD-10-CM | POA: Diagnosis not present

## 2023-01-11 DIAGNOSIS — U071 COVID-19: Secondary | ICD-10-CM | POA: Diagnosis not present

## 2023-01-11 DIAGNOSIS — R278 Other lack of coordination: Secondary | ICD-10-CM | POA: Diagnosis not present

## 2023-01-11 DIAGNOSIS — I10 Essential (primary) hypertension: Secondary | ICD-10-CM

## 2023-01-11 DIAGNOSIS — Z9181 History of falling: Secondary | ICD-10-CM | POA: Diagnosis not present

## 2023-01-11 MED ORDER — CARVEDILOL 6.25 MG PO TABS
6.2500 mg | ORAL_TABLET | Freq: Two times a day (BID) | ORAL | 3 refills | Status: DC
Start: 2023-01-11 — End: 2023-03-19

## 2023-01-11 MED ORDER — TORSEMIDE 20 MG PO TABS: 20.0000 mg | ORAL_TABLET | ORAL | 3 refills | Status: DC

## 2023-01-12 DIAGNOSIS — R2681 Unsteadiness on feet: Secondary | ICD-10-CM | POA: Diagnosis not present

## 2023-01-12 DIAGNOSIS — R278 Other lack of coordination: Secondary | ICD-10-CM | POA: Diagnosis not present

## 2023-01-12 DIAGNOSIS — M6281 Muscle weakness (generalized): Secondary | ICD-10-CM | POA: Diagnosis not present

## 2023-01-13 ENCOUNTER — Encounter: Payer: Self-pay | Admitting: Internal Medicine

## 2023-01-14 DIAGNOSIS — G4733 Obstructive sleep apnea (adult) (pediatric): Secondary | ICD-10-CM | POA: Diagnosis not present

## 2023-01-15 ENCOUNTER — Ambulatory Visit: Payer: Self-pay

## 2023-01-15 DIAGNOSIS — U071 COVID-19: Secondary | ICD-10-CM | POA: Diagnosis not present

## 2023-01-15 DIAGNOSIS — R278 Other lack of coordination: Secondary | ICD-10-CM | POA: Diagnosis not present

## 2023-01-15 DIAGNOSIS — R2681 Unsteadiness on feet: Secondary | ICD-10-CM | POA: Diagnosis not present

## 2023-01-15 DIAGNOSIS — M6281 Muscle weakness (generalized): Secondary | ICD-10-CM | POA: Diagnosis not present

## 2023-01-15 DIAGNOSIS — J449 Chronic obstructive pulmonary disease, unspecified: Secondary | ICD-10-CM | POA: Diagnosis not present

## 2023-01-15 DIAGNOSIS — R4789 Other speech disturbances: Secondary | ICD-10-CM | POA: Diagnosis not present

## 2023-01-15 NOTE — Patient Instructions (Signed)
Visit Information  Thank you for taking time to visit with me today. Please don't hesitate to contact me if I can be of assistance to you.   Following are the goals we discussed today:  Continue to take medications as prescribed. Continue to attend provider visits as scheduled Continue to eat healthy, lean meats, vegetables, fruits, avoid saturated and transfats Continue to check blood pressure and record as recommended by provider. Take reading to provider office visits.  Our next appointment is by telephone on 02/13/23 at 10:00 am  Please call the care guide team at [phone] if you need to cancel or reschedule your appointment.   If you are experiencing a Mental Health or Behavioral Health Crisis or need someone to talk to, please call the Suicide and Crisis Lifeline: 988 call the [place] National Suicide Prevention Lifeline: (580)567-3675 or TTY: (650)623-7945 TTY (903)816-9501) to talk to a trained counselor call 1-800-273-TALK (toll free, 24 hour hotline)   Kathyrn Sheriff, RN, MSN, BSN, CCM Care Management Coordinator [phone]

## 2023-01-15 NOTE — Patient Outreach (Unsigned)
Care Coordination   Care Coordination  Visit Note   01/15/2023 Name: [name] MRN: [id number] DOB: 12-20-1940  [name] is a 82 y.o. year old female who sees Plotnikov, Georgina Quint, MD for primary care.  Care Coordination/Collaboration:  -RNCM returned call to patient's son, Graycin Marcucci). He expressed has concerns regarding patient's motivation, concerned that she "may be depressed".  He reports she is taking her medications, but states, patient did not increase as recommended by provider.  He also reports she is not participating in activities she enjoys like card game bridge. Patient with upcoming appointment on 01/30/23. Son to speak with primary care provider at next office visit 01/30/23 regarding recommendations.  Goals Addressed             This Visit's Progress    Health management       Interventions Today    Flowsheet Row Most Recent Value  General Interventions   General Interventions Discussed/Reviewed Communication with  Communication with PCP/Specialists, Social Work  Education Interventions   Provided Verbal Education On Other  [advised son to discuss with in communication with patient primary provider]            SDOH assessments and interventions completed:  No{THN Tip this will not be part of the note when signed-REQUIRED REPORT FIELD DO NOT DELETE (Optional):27901}  Care Coordination Interventions:  Yes, provided {THN Tip this will not be part of the note when signed-REQUIRED REPORT FIELD DO NOT DELETE (Optional):27901}  Follow up plan:  Collaborate with clinical social work, update primary care provider, outreach to patient as scheduled today.    Encounter Outcome:  Patient Visit Completed {THN Tip this will not be part of the note when signed-REQUIRED REPORT FIELD DO NOT DELETE (Optional):27901}  Kathyrn Sheriff, RN, MSN, BSN, CCM Care Management Coordinator [phone]

## 2023-01-15 NOTE — Patient Outreach (Signed)
Care Coordination   Follow Up Visit Note   01/15/2023 Name: [name] MRN: [id number] DOB: 1940-11-14  [name] is a 82 y.o. year old female who sees Plotnikov, Georgina Quint, MD for primary care. I spoke with  [name] by phone today.  What matters to the patients health and wellness today?  Reports active with PT/OTSP. She reports each discipline is coming twice a week and reports it is helping. She reports she has her medications and reports is taking as prescribed. Per Ms. [name], she has an appointment with nephrologist 01/17/23, Wednesday and a follow up with PCP on 01/30/23.   PHQ2 is 1. Patient denies feeling down depressed or hopeless. She states, "my energy is low".  RNCM informed patient that her son is concerned about her level of motivation. [name] reports her problem is procrastination and states her record keeping got behind when she got sick and that updating the Will has been more than she thought it would be. RNCM questions if patient is overwhelmed. She states she is interested in playing bridge, but she does not because she feels she needs to be doing other things, but then she does not get those things done either. She states, "I am stuck in procrastination".   Goals Addressed             This Visit's Progress    Health management       Interventions Today    Flowsheet Row Most Recent Value  Chronic Disease   Chronic disease during today's visit Chronic Kidney Disease/End Stage Renal Disease (ESRD), Hypertension (HTN)  General Interventions   General Interventions Discussed/Reviewed General Interventions Reviewed, Doctor Visits, Communication with  Doctor Visits Discussed/Reviewed PCP, Specialist  PCP/Specialist Visits Compliance with follow-up visit  Communication with PCP/Specialists, Social Work  [employer] PCP and LCSW regarding patient status and son's concerns]  Exercise Interventions   Exercise Discussed/Reviewed Exercise Reviewed  Education  Interventions   Education Provided Provided Education  Provided Verbal Education On Other, Exercise, Medication, Mental Health/Coping with Illness, When to see the doctor  [advised to continue therapy as recommended by therapist, attend provider appointments as recommended, take medications as recommended. discussed importance of doing things one enjoys in overall health and well being.]  Mental Health Interventions   Mental Health Discussed/Reviewed Mental Health Discussed  [encouraged patient to start participating in actvities that she enjoys. scheduled appointment with LCSW, [name] for 01/22/23]  Nutrition Interventions   Nutrition Discussed/Reviewed Nutrition Discussed  Pharmacy Interventions   Pharmacy Dicussed/Reviewed Pharmacy Topics Reviewed  [medication review completed]  Safety Interventions   Safety Discussed/Reviewed Fall Risk, Safety Reviewed            SDOH assessments and interventions completed:  Yes  Care Coordination Interventions:  Yes, provided   Follow up plan: Follow up call scheduled for 02/13/23    Encounter Outcome:  Patient Visit Completed   Sherry Sheriff, RN, MSN, BSN, CCM Care Management Coordinator [phone]

## 2023-01-16 ENCOUNTER — Encounter (HOSPITAL_COMMUNITY)
Admission: RE | Admit: 2023-01-16 | Discharge: 2023-01-16 | Disposition: A | Discharge status: Home / Self Care | Payer: Medicare PPO | Source: Ambulatory Visit | Attending: Nephrology

## 2023-01-16 VITALS — BP 158/51 | HR 76 | Temp 98.0°F | Resp 17

## 2023-01-16 DIAGNOSIS — U071 COVID-19: Secondary | ICD-10-CM | POA: Diagnosis not present

## 2023-01-16 DIAGNOSIS — F419 Anxiety disorder, unspecified: Secondary | ICD-10-CM | POA: Diagnosis not present

## 2023-01-16 DIAGNOSIS — Z86711 Personal history of pulmonary embolism: Secondary | ICD-10-CM | POA: Diagnosis not present

## 2023-01-16 DIAGNOSIS — M069 Rheumatoid arthritis, unspecified: Secondary | ICD-10-CM | POA: Diagnosis not present

## 2023-01-16 DIAGNOSIS — N185 Chronic kidney disease, stage 5: Secondary | ICD-10-CM | POA: Diagnosis not present

## 2023-01-16 DIAGNOSIS — Z833 Family history of diabetes mellitus: Secondary | ICD-10-CM | POA: Diagnosis not present

## 2023-01-16 DIAGNOSIS — M545 Low back pain, unspecified: Secondary | ICD-10-CM | POA: Diagnosis not present

## 2023-01-16 DIAGNOSIS — R32 Unspecified urinary incontinence: Secondary | ICD-10-CM | POA: Diagnosis not present

## 2023-01-16 DIAGNOSIS — R278 Other lack of coordination: Secondary | ICD-10-CM | POA: Diagnosis not present

## 2023-01-16 DIAGNOSIS — K219 Gastro-esophageal reflux disease without esophagitis: Secondary | ICD-10-CM | POA: Diagnosis not present

## 2023-01-16 DIAGNOSIS — J309 Allergic rhinitis, unspecified: Secondary | ICD-10-CM | POA: Diagnosis not present

## 2023-01-16 DIAGNOSIS — N184 Chronic kidney disease, stage 4 (severe): Secondary | ICD-10-CM

## 2023-01-16 DIAGNOSIS — M2010 Hallux valgus (acquired), unspecified foot: Secondary | ICD-10-CM | POA: Diagnosis not present

## 2023-01-16 DIAGNOSIS — E785 Hyperlipidemia, unspecified: Secondary | ICD-10-CM | POA: Diagnosis not present

## 2023-01-16 DIAGNOSIS — Q8901 Asplenia (congenital): Secondary | ICD-10-CM | POA: Diagnosis not present

## 2023-01-16 DIAGNOSIS — Z86718 Personal history of other venous thrombosis and embolism: Secondary | ICD-10-CM | POA: Diagnosis not present

## 2023-01-16 DIAGNOSIS — K5900 Constipation, unspecified: Secondary | ICD-10-CM | POA: Diagnosis not present

## 2023-01-16 DIAGNOSIS — I951 Orthostatic hypotension: Secondary | ICD-10-CM | POA: Diagnosis not present

## 2023-01-16 DIAGNOSIS — M6281 Muscle weakness (generalized): Secondary | ICD-10-CM | POA: Diagnosis not present

## 2023-01-16 DIAGNOSIS — I2510 Atherosclerotic heart disease of native coronary artery without angina pectoris: Secondary | ICD-10-CM | POA: Diagnosis not present

## 2023-01-16 DIAGNOSIS — Z9181 History of falling: Secondary | ICD-10-CM | POA: Diagnosis not present

## 2023-01-16 LAB — POCT HEMOGLOBIN-HEMACUE: Hemoglobin: 9.2 g/dL — ABNORMAL LOW (ref 12.0–15.0)

## 2023-01-16 MED ORDER — EPOETIN ALFA-EPBX 10000 UNIT/ML IJ SOLN
20000.0000 [IU] | INTRAMUSCULAR | Status: DC
  Administered 2023-01-16: 20000 [IU] via SUBCUTANEOUS

## 2023-01-16 MED ORDER — EPOETIN ALFA-EPBX 10000 UNIT/ML IJ SOLN
INTRAMUSCULAR | Status: AC
  Filled 2023-01-16: qty 2

## 2023-01-17 DIAGNOSIS — I2510 Atherosclerotic heart disease of native coronary artery without angina pectoris: Secondary | ICD-10-CM | POA: Diagnosis not present

## 2023-01-17 DIAGNOSIS — I120 Hypertensive chronic kidney disease with stage 5 chronic kidney disease or end stage renal disease: Secondary | ICD-10-CM | POA: Diagnosis not present

## 2023-01-17 DIAGNOSIS — E875 Hyperkalemia: Secondary | ICD-10-CM | POA: Diagnosis not present

## 2023-01-17 DIAGNOSIS — R4789 Other speech disturbances: Secondary | ICD-10-CM | POA: Diagnosis not present

## 2023-01-17 DIAGNOSIS — Z9889 Other specified postprocedural states: Secondary | ICD-10-CM | POA: Diagnosis not present

## 2023-01-17 DIAGNOSIS — R2681 Unsteadiness on feet: Secondary | ICD-10-CM | POA: Diagnosis not present

## 2023-01-17 DIAGNOSIS — K922 Gastrointestinal hemorrhage, unspecified: Secondary | ICD-10-CM | POA: Diagnosis not present

## 2023-01-17 DIAGNOSIS — M6281 Muscle weakness (generalized): Secondary | ICD-10-CM | POA: Diagnosis not present

## 2023-01-17 DIAGNOSIS — U071 COVID-19: Secondary | ICD-10-CM | POA: Diagnosis not present

## 2023-01-17 DIAGNOSIS — J449 Chronic obstructive pulmonary disease, unspecified: Secondary | ICD-10-CM | POA: Diagnosis not present

## 2023-01-17 DIAGNOSIS — D631 Anemia in chronic kidney disease: Secondary | ICD-10-CM | POA: Diagnosis not present

## 2023-01-17 DIAGNOSIS — N185 Chronic kidney disease, stage 5: Secondary | ICD-10-CM | POA: Diagnosis not present

## 2023-01-17 DIAGNOSIS — R278 Other lack of coordination: Secondary | ICD-10-CM | POA: Diagnosis not present

## 2023-01-18 DIAGNOSIS — U071 COVID-19: Secondary | ICD-10-CM | POA: Diagnosis not present

## 2023-01-18 DIAGNOSIS — Z9181 History of falling: Secondary | ICD-10-CM | POA: Diagnosis not present

## 2023-01-18 DIAGNOSIS — R278 Other lack of coordination: Secondary | ICD-10-CM | POA: Diagnosis not present

## 2023-01-18 DIAGNOSIS — M6281 Muscle weakness (generalized): Secondary | ICD-10-CM | POA: Diagnosis not present

## 2023-01-20 ENCOUNTER — Other Ambulatory Visit: Payer: Self-pay | Admitting: Internal Medicine

## 2023-01-20 MED ORDER — CLONIDINE 0.2 MG/24HR TD PTWK: 0.2000 mg | MEDICATED_PATCH | TRANSDERMAL | 3 refills | Status: DC

## 2023-01-22 ENCOUNTER — Ambulatory Visit: Payer: Self-pay | Admitting: Licensed Clinical Social Worker

## 2023-01-22 DIAGNOSIS — J449 Chronic obstructive pulmonary disease, unspecified: Secondary | ICD-10-CM | POA: Diagnosis not present

## 2023-01-22 DIAGNOSIS — U071 COVID-19: Secondary | ICD-10-CM | POA: Diagnosis not present

## 2023-01-22 DIAGNOSIS — M6281 Muscle weakness (generalized): Secondary | ICD-10-CM | POA: Diagnosis not present

## 2023-01-22 DIAGNOSIS — R4789 Other speech disturbances: Secondary | ICD-10-CM | POA: Diagnosis not present

## 2023-01-22 DIAGNOSIS — R2681 Unsteadiness on feet: Secondary | ICD-10-CM | POA: Diagnosis not present

## 2023-01-22 DIAGNOSIS — R278 Other lack of coordination: Secondary | ICD-10-CM | POA: Diagnosis not present

## 2023-01-22 NOTE — Patient Instructions (Signed)
Visit Information  Thank you for taking time to visit with me today. Please don't hesitate to contact me if I can be of assistance to you.   Following are the goals we discussed today:   Goals Addressed             This Visit's Progress    Patient Stated  she is concerned about legal issues and financial issues and ensuring that affairs are in order for her and her spouse       Interventions:  Spoke with [name] via phone today about her current status and her current medical needs Discussed program support for client with RN, LCSW, and Pharmacist Spoke with Deborah about her HHPT (2X weekly appointments) and her HHOT (2X weekly appointments). She also receives outpatient Speech Therapy sessions 2X weekly. Discussed health status of her spouse Discussed client support with kidney specialist. 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 She said she has talked with kidney specialist about her kidney needs; she has talked with kidney specialist about possibly receiving dialysis treatments for client in home of client Discussed pain issues of client. Discussed medication procurement of client. Discussed appetite of client Discussed transport needs. She said her spouse drives her to and from her medical appointments as needed Discussed mood or anxiety issues. She said it is hard to keep all her appointments organized . She puts appointments in her phone. She puts appointments on her calendar . She is trying to attend or participate in all scheduled appointments. Discussed support of client with RN Kathyrn Sheriff Provided counseling support Encouraged client to call LCSW as needed for SW support at [phone].           Our next appointment is by telephone on 03/13/23 at 10:00 AM   Please call the care guide team at [phone] if you need to cancel or reschedule your appointment.   If you are experiencing a Mental Health or Behavioral Health Crisis or need someone to talk to, please go to  [facility] [address] [place] [phone])   The patient verbalized understanding of instructions, educational materials, and care plan provided today and DECLINED offer to receive copy of patient instructions, educational materials, and care plan.   The patient has been provided with contact information for the care management team and has been advised to call with any health related questions or concerns.   Kelton Pillar.Eland Lamantia MSW, LCSW Licensed Visual merchandiser [facility] Care Management [phone]

## 2023-01-22 NOTE — Patient Outreach (Signed)
Care Coordination   Follow Up Visit Note   01/22/2023 Name: [name] MRN: [id number] DOB: Aug 07, 1940  [name] is a 82 y.o. year old female who sees [name], Sherry Quint, MD for primary care. I spoke with  [name] by phone today.  What matters to the patients health and wellness today?  Patient Stated  she is concerned about legal issues and financial issues and ensuring that affairs are in order for her and her spouse    Goals Addressed             This Visit's Progress    Patient Stated  she is concerned about legal issues and financial issues and ensuring that affairs are in order for her and her spouse       Interventions:  Spoke with [name] via phone today about her current status and her current medical needs Discussed program support for client with RN, LCSW, and Pharmacist Spoke with [name] about her HHPT (2X weekly appointments) and her HHOT (2X weekly appointments). She also receives outpatient Speech Therapy sessions 2X weekly. Discussed health status of her spouse Discussed client support with kidney specialist. 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 She said she has talked with kidney specialist about her kidney needs; she has talked with kidney specialist about possibly receiving dialysis treatments for client in home of client Discussed pain issues of client. Discussed medication procurement of client. Discussed appetite of client Discussed transport needs. She said her spouse drives her to and from her medical appointments as needed Discussed mood or anxiety issues. She said it is hard to keep all her appointments organized . She puts appointments in her phone. She puts appointments on her calendar . She is trying to attend or participate in all scheduled appointments. Discussed support of client with RN [name] Provided counseling support Encouraged client to call LCSW as needed for SW support at [phone].           SDOH assessments and  interventions completed:  Yes  SDOH Interventions Today    Flowsheet Row Most Recent Value  SDOH Interventions   Depression Interventions/Treatment  Counseling  Physical Activity Interventions Other (Comments)  [receives physcal therapy session 2 times weekly as scheduled]  Stress Interventions Provide Counseling  [has stress in managing medical needs. Has stage V CKD. Has talked with Kidney specialist about her kidney needs and possible dialysis treatments in her home]        Care Coordination Interventions:  Yes, provided  Interventions Today    Flowsheet Row Most Recent Value  Chronic Disease   Chronic disease during today's visit Other  [spoke with client about client needs]  General Interventions   General Interventions Discussed/Reviewed General Interventions Discussed, Community Resources  Education Interventions   Education Provided Provided Education  Provided Engineer, [employer] On [employer]  Mental Health Interventions   Mental Health Discussed/Reviewed Coping Strategies  [discussed coping skills to manage medical needs. Has HHPT 2 X weekly,  Has HHOT 2X weekly . Has outpatient speech therapy 2X weekly. Some anxiety in managing medical issues]  Nutrition Interventions   Nutrition Discussed/Reviewed Nutrition Discussed  Pharmacy Interventions   Pharmacy Dicussed/Reviewed Pharmacy Topics Discussed  Safety Interventions   Safety Discussed/Reviewed Fall Risk        Follow up plan: Follow up call scheduled for 03/13/23 at 10:00 AM    Encounter Outcome:  Patient Visit Completed   [name].[name] MSW, LCSW Licensed Visual merchandiser [facility] Care  Management [phone]

## 2023-01-23 DIAGNOSIS — U071 COVID-19: Secondary | ICD-10-CM | POA: Diagnosis not present

## 2023-01-23 DIAGNOSIS — Z9181 History of falling: Secondary | ICD-10-CM | POA: Diagnosis not present

## 2023-01-23 DIAGNOSIS — M6281 Muscle weakness (generalized): Secondary | ICD-10-CM | POA: Diagnosis not present

## 2023-01-23 DIAGNOSIS — R278 Other lack of coordination: Secondary | ICD-10-CM | POA: Diagnosis not present

## 2023-01-24 DIAGNOSIS — M6281 Muscle weakness (generalized): Secondary | ICD-10-CM | POA: Diagnosis not present

## 2023-01-24 DIAGNOSIS — R278 Other lack of coordination: Secondary | ICD-10-CM | POA: Diagnosis not present

## 2023-01-24 DIAGNOSIS — R2681 Unsteadiness on feet: Secondary | ICD-10-CM | POA: Diagnosis not present

## 2023-01-26 ENCOUNTER — Ambulatory Visit (INDEPENDENT_AMBULATORY_CARE_PROVIDER_SITE_OTHER): Payer: Medicare PPO | Admitting: Nurse Practitioner

## 2023-01-26 VITALS — BP 132/68 | HR 100 | Temp 97.8°F | Ht 63.0 in | Wt 124.4 lb

## 2023-01-26 DIAGNOSIS — H60502 Unspecified acute noninfective otitis externa, left ear: Secondary | ICD-10-CM | POA: Insufficient documentation

## 2023-01-26 DIAGNOSIS — K047 Periapical abscess without sinus: Secondary | ICD-10-CM | POA: Diagnosis not present

## 2023-01-26 MED ORDER — AMOXICILLIN 250 MG PO CAPS
250.0000 mg | ORAL_CAPSULE | Freq: Two times a day (BID) | ORAL | 0 refills | Status: DC
Start: 2023-01-26 — End: 2023-02-09

## 2023-01-26 MED ORDER — CIPROFLOXACIN-DEXAMETHASONE 0.3-0.1 % OT SUSP
4.0000 [drp] | Freq: Two times a day (BID) | OTIC | 0 refills | Status: DC
Start: 2023-01-26 — End: 2023-02-09

## 2023-01-26 MED ORDER — DOXYCYCLINE HYCLATE 100 MG PO TABS: 100.0000 mg | ORAL_TABLET | Freq: Two times a day (BID) | ORAL | 0 refills | Status: DC

## 2023-01-26 NOTE — Assessment & Plan Note (Signed)
Acute Treat with Ciprodex drops.  4 drops for left ear twice a day x 7 to 10 days.  Call if symptoms persist or worsen.

## 2023-01-26 NOTE — Assessment & Plan Note (Signed)
Patient is left jaw and face is swollen and red.  Will treat with oral antibiotics.  Will do amoxicillin 250 mg twice a day x 7 days and doxycycline 100 mg twice a day x 10 days.  Patient was told to follow-up with dentist as soon as possible next week for close monitoring.  She was also told to proceed to the emergency department if symptoms worsen over the weekend for IV antibiotics.  She reports her understanding.

## 2023-01-26 NOTE — Progress Notes (Signed)
Established Patient Office Visit  Subjective   Patient ID: [name], female    DOB: 1940/05/31  Age: 82 y.o. MRN: [id number]  Chief Complaint  Patient presents with   Dental Pain    Started as tooth ache and has progress to the whole left side of the face to the ears     Patient arrives for acute visit for the above.  Symptoms initially started in left ear about 1 week ago.  About to 2 days ago she started spearing seeing pain in her left sided jaw.  She denies any fever.       ROS: see HPI    Objective:     BP 132/68   Pulse 100   Temp 97.8 F (36.6 C) (Temporal)   Ht 5\' 3"  (1.6 m)   Wt 124 lb 6 oz (56.4 kg)   SpO2 99%   BMI 22.03 kg/m    Physical Exam Vitals reviewed.  Constitutional:      General: She is not in acute distress.    Appearance: Normal appearance.  HENT:     Head: Normocephalic and atraumatic.     Right Ear: Hearing, tympanic membrane, ear canal and external ear normal.     Left Ear: Hearing normal.     Ears:     Comments: Left ear canal swollen, purulent drainage noted    Mouth/Throat:     Dentition: Dental caries present. No gingival swelling or dental abscesses.  Neck:     Vascular: No carotid bruit.  Cardiovascular:     Rate and Rhythm: Normal rate and regular rhythm.     Pulses: Normal pulses.     Heart sounds: Normal heart sounds.  Pulmonary:     Effort: Pulmonary effort is normal.     Breath sounds: Normal breath sounds.  Skin:    General: Skin is warm and dry.       Neurological:     General: No focal deficit present.     Mental Status: She is alert and oriented to person, place, and time.  Psychiatric:        Mood and Affect: Mood normal.        Behavior: Behavior normal.        Judgment: Judgment normal.      No results found for any visits on 01/26/23.    The ASCVD Risk score (Arnett DK, et al., 2019) failed to calculate for the following reasons:   The 2019 ASCVD risk score is only valid for ages 25 to 77    The patient has a prior MI or stroke diagnosis    Assessment & Plan:   Problem List Items Addressed This Visit       Digestive   Tooth abscess    Patient is left jaw and face is swollen and red.  Will treat with oral antibiotics.  Will do amoxicillin 250 mg twice a day x 7 days and doxycycline 100 mg twice a day x 10 days.  Patient was told to follow-up with dentist as soon as possible next week for close monitoring.  She was also told to proceed to the emergency department if symptoms worsen over the weekend for IV antibiotics.  She reports her understanding.      Relevant Medications   doxycycline (VIBRA-TABS) 100 MG tablet   amoxicillin (AMOXIL) 250 MG capsule     Nervous and Auditory   Acute otitis externa of left ear - Primary    Acute Treat  with Ciprodex drops.  4 drops for left ear twice a day x 7 to 10 days.  Call if symptoms persist or worsen.      Relevant Medications   ciprofloxacin-dexamethasone (CIPRODEX) OTIC suspension   **Plan of care discussed with supervising physician (Dr. Lawerance Bach) who also performed a physical assessment on the patient and she was agreeable to the above plan.    Return if symptoms worsen or fail to improve.    Elenore Paddy, NP

## 2023-01-29 NOTE — Telephone Encounter (Addendum)
See message from 01/23/2023.   Nothing further needed.

## 2023-01-29 NOTE — Telephone Encounter (Signed)
Patient is needing to reschedule her CPAP titration study. It was scheduled for 8/1 but she had to cancel.

## 2023-01-30 ENCOUNTER — Encounter (HOSPITAL_COMMUNITY)
Admission: RE | Admit: 2023-01-30 | Discharge: 2023-01-30 | Disposition: A | Discharge status: Home / Self Care | Payer: Medicare PPO | Source: Ambulatory Visit | Attending: Nephrology | Admitting: Nephrology

## 2023-01-30 ENCOUNTER — Ambulatory Visit: Payer: Medicare PPO | Admitting: Internal Medicine

## 2023-01-30 ENCOUNTER — Encounter (HOSPITAL_COMMUNITY): Payer: Medicare PPO

## 2023-01-30 VITALS — BP 107/60 | HR 70 | Temp 97.2°F | Resp 17

## 2023-01-30 DIAGNOSIS — Z9181 History of falling: Secondary | ICD-10-CM | POA: Diagnosis not present

## 2023-01-30 DIAGNOSIS — U071 COVID-19: Secondary | ICD-10-CM | POA: Diagnosis not present

## 2023-01-30 DIAGNOSIS — N184 Chronic kidney disease, stage 4 (severe): Secondary | ICD-10-CM | POA: Insufficient documentation

## 2023-01-30 DIAGNOSIS — M6281 Muscle weakness (generalized): Secondary | ICD-10-CM | POA: Diagnosis not present

## 2023-01-30 DIAGNOSIS — R278 Other lack of coordination: Secondary | ICD-10-CM | POA: Diagnosis not present

## 2023-01-30 LAB — IRON AND TIBC
Iron: 76 ug/dL (ref 28–170)
Saturation Ratios: 30 % (ref 10.4–31.8)
TIBC: 256 ug/dL (ref 250–450)
UIBC: 180 ug/dL

## 2023-01-30 LAB — POCT HEMOGLOBIN-HEMACUE: Hemoglobin: 10.4 g/dL — ABNORMAL LOW (ref 12.0–15.0)

## 2023-01-30 LAB — FERRITIN: Ferritin: 267 ng/mL (ref 11–307)

## 2023-01-30 MED ORDER — EPOETIN ALFA-EPBX 10000 UNIT/ML IJ SOLN
20000.0000 [IU] | INTRAMUSCULAR | Status: DC
  Administered 2023-01-30: 20000 [IU] via SUBCUTANEOUS

## 2023-01-30 MED ORDER — EPOETIN ALFA-EPBX 10000 UNIT/ML IJ SOLN
INTRAMUSCULAR | Status: AC
  Filled 2023-01-30: qty 2

## 2023-01-31 ENCOUNTER — Ambulatory Visit: Payer: Medicare PPO | Attending: Cardiology | Admitting: Cardiology

## 2023-01-31 VITALS — BP 102/54 | HR 68 | Ht 63.0 in | Wt 136.6 lb

## 2023-01-31 DIAGNOSIS — E785 Hyperlipidemia, unspecified: Secondary | ICD-10-CM

## 2023-01-31 DIAGNOSIS — K922 Gastrointestinal hemorrhage, unspecified: Secondary | ICD-10-CM | POA: Diagnosis not present

## 2023-01-31 DIAGNOSIS — I2510 Atherosclerotic heart disease of native coronary artery without angina pectoris: Secondary | ICD-10-CM | POA: Diagnosis not present

## 2023-01-31 DIAGNOSIS — I10 Essential (primary) hypertension: Secondary | ICD-10-CM | POA: Diagnosis not present

## 2023-01-31 DIAGNOSIS — E782 Mixed hyperlipidemia: Secondary | ICD-10-CM

## 2023-01-31 DIAGNOSIS — K551 Chronic vascular disorders of intestine: Secondary | ICD-10-CM | POA: Diagnosis not present

## 2023-01-31 DIAGNOSIS — G4733 Obstructive sleep apnea (adult) (pediatric): Secondary | ICD-10-CM

## 2023-01-31 DIAGNOSIS — I951 Orthostatic hypotension: Secondary | ICD-10-CM

## 2023-01-31 DIAGNOSIS — N184 Chronic kidney disease, stage 4 (severe): Secondary | ICD-10-CM | POA: Diagnosis not present

## 2023-01-31 DIAGNOSIS — Z951 Presence of aortocoronary bypass graft: Secondary | ICD-10-CM | POA: Diagnosis not present

## 2023-01-31 MED ORDER — AMLODIPINE BESYLATE 5 MG PO TABS: 5.0000 mg | ORAL_TABLET | Freq: Every day | ORAL | 3 refills | Status: DC

## 2023-01-31 MED ORDER — CLOPIDOGREL BISULFATE 75 MG PO TABS: 75.0000 mg | ORAL_TABLET | ORAL | 3 refills | Status: DC

## 2023-01-31 NOTE — Patient Instructions (Signed)
Medication Instructions:   Decrease Amlodipine to 5 mg daily starting tomorrow and do not take your torsemide tomorrow either.   Start taking Clopidogrel 75 mg  every other day   *If you need a refill on your cardiac medications before your next appointment, please call your pharmacy*   Lab Work: when you see Dr [name] Lipid Liver panel  If you have labs (blood work) drawn today and your tests are completely normal, you will receive your results only by: MyChart Message (if you have MyChart) OR A paper copy in the mail If you have any lab test that is abnormal or we need to change your treatment, we will call you to review the results.   Testing/Procedures:  Not needed   Follow-Up: At [facility], you and your health needs are our priority.  As part of our continuing mission to provide you with exceptional heart care, we have created designated Provider Care Teams.  These Care Teams include your primary Cardiologist (physician) and Advanced Practice Providers (APPs -  Physician Assistants and Nurse Practitioners) who all work together to provide you with the care you need, when you need it.     Your next appointment:   6 month(s)  The format for your next appointment:   In Person  Provider:   Marjie Skiff, PA-C, Joni Reining, DNP, ANP, or Bernadene Person, NP    Then, Bryan Lemma, MD will plan to see you again in 12 month(s).   Other Instructions     Keep Hydrate especially to day , hydrate 60 to 70 oz daily

## 2023-01-31 NOTE — Progress Notes (Unsigned)
Cardiology Office Note:  .   Date:  02/04/2023  ID:  [name], DOB 01-30-1941, MRN [id number] PCP: [name], [name]  [facility] Providers Cardiologist:  Bryan Lemma, [name]     Vascular Surgeon: Dr. Myra Gianotti  Chief Complaint  Patient presents with   Follow-up    Has been 1 year since last visit with multiple events in the interim.   Coronary Artery Disease    No angina.   Hypertension    Quite labile.  Multiple medications, but has significant orthostasis.    Patient Profile: .     [name] is a  82 y.o. female  with a complicated PMH reviewed below who presents here for annual follow-up at the request of [name]..  Pertinent CV History CAD, s/p CABG X 1 (LIMA to LAD) in 1996 -> Follow-up cath: Atretic LIMA with improved LAD flow now 50% ostial LAD.  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   Last seen on 01/10/2023-by Flint Melter, NP ([facility])-> CPAP setting changed to APAP 15 cm H2O Hypothyroidism  I have not seen [name] since February 2020-during the COVID pandemic, she was seen a couple times via telehealth medicine visits by APP's.  She was last seen on 01/17/2022 by Sharlene Dory, NP for preop clearance => cleared to proceed with surgery, okay to hold Plavix 5 to 7 days preop for surgeries.  Recommended at replacing with aspirin. RCRI score: 3.  Perioperative risk of major cardiac event is 11%. Duke activity status index score is 26.95.  Functional capacity in METS is 6.05.   Addendum on 01/20/22: RCRI score is 3.  Perioperative risk  of major cardiac event is 11%.  Duke activity status index score is 13.45.  Functional capacity in METS is 4.40.  01/23/2022: Left Total Hip Arthroplasty-Dr. Roda Shutters 03/06/2022-dislocation of left hip-reset manually. 02/06/2022: Admitted with PE-scattered subsegmental RML PE. (thought to be provoked following hip surgery).  Echo normal.-> d/c on Eliquis. Recurrent GI bleed 02/16/2022 => Eliquis held for total 7 days. He currently on Feraheme Repeat admission for GI bleed 11/26/2022, Eliquis restarted 11/29/2022 => back in with GI bleed on 12/07/2022 -> this time, Eliquis was discontinued.  (Completed almost 8 months of therapy for PE) with 07/02/2022: Admitted for pyelonephritis-sepsis; complicated by GI bleed and AKI. Found to have gastric/duodenal ulcers-discharged on PPI. 08/02/2022: Fall, completed by nasal bone fracture 09/30/2022-ER for hypertension  6 x 24, ER for hypertensive urgency    Subjective   INTERVAL HPI Last seen by PCP on 11/30/2022 for hospital follow-up-BP at that time was 150/60 Also was noted to have COVID infection December 25, 2022 Most recent clinic visit was with Jiles Prows, NP who started treatment for otitis externa on 01/26/2023.  Discussed the use of AI scribe software for clinical note transcription with the patient, who gave verbal consent to proceed.  History of Present Illness   The patient, with a complex medical history including hip replacement surgery, kidney problems, sepsis, pneumonia, and GI bleeds, presents with dizziness and a rash. The patient reports having a hip replacement surgery last September,  which dislocated about three weeks later due to stepping on the wrong foot. Since then, the patient has been in and out of the hospital due to severe kidney infection, sepsis, pneumonia, and rehab.  The patient also reports having a GI bleed in July of 2023, which was found to be due to hemorrhoids. The patient was on Eliquis, a blood thinner, which was stopped due to the  GI bleed. The patient also reports having high blood pressure, which has been managed with amlodipine, carvedilol, and clonidine. The patient reports feeling dizzy, especially when the blood pressure drops.  The patient also reports having a rash, which was evaluated by another doctor who thought it was a bacterial infection. The patient is currently on amoxicillin for this. The patient also reports having sleep apnea and is using CPAP. The patient is scheduled for a sleep study, which was cancelled due to discharge from rehab.  The patient also reports having stage four kidney disease and is possibly going on dialysis. The patient also reports having a fall last week due to dizziness, which resulted in a fractured nose.      Thankfully, throughout all of her hospitalizations, she has not had any significant episodes of chest pain or pressure with rest or exertion. She also has not had any rapid irregular beats palpitations. Cardiovascular ROS: positive for - dyspnea on exertion and mostly now because of COPD and deconditioning but also has some dizziness.  Notable orthostatic changes. negative for - chest pain, irregular heartbeat, orthopnea, palpitations, paroxysmal nocturnal dyspnea, rapid heart rate, or syncope or near syncope, TIA or amaurosis fugax, claudication.  Since August, no further melena, hematochezia, hematuria or epistaxis.  ROS:  Review of Systems - Negative except extreme fatigue, recent falls, poor balance.  Ongoing treatment for otitis externa, orthostatic dizziness.     Objective   Studies Reviewed: [name]        Lab Results  Component Value Date   CHOL 157 09/07/2020   HDL 59.30 09/07/2020   LDLCALC 74 09/07/2020   LDLDIRECT 162.1 10/28/2009   TRIG 121.0 09/07/2020   CHOLHDL 3 09/07/2020   Lab Results  Component Value Date   NA 138 12/09/2022   CL 104 12/09/2022   K 4.5 12/09/2022   CO2 21 (L) 12/09/2022   BUN 62 (H) 12/09/2022   CREATININE 3.54 (H) 12/09/2022    GFRNONAA 12 (L) 12/09/2022   CALCIUM 9.0 12/09/2022   PHOS 5.4 (H) 07/25/2022   ALBUMIN 3.2 (L) 12/07/2022   GLUCOSE 114 (H) 12/09/2022    ECHO 02/07/2022:   1. Left ventricular ejection fraction, by estimation, is 65 to 70%. Left  ventricular ejection fraction by 3D volume is 68 %. The left ventricle has  normal function. The left ventricle has no regional wall motion  abnormalities. There is severe asymmetric   left ventricular hypertrophy. Left ventricular diastolic parameters are  consistent with Grade II diastolic dysfunction (pseudonormalization).   2. Right ventricular systolic function is normal. The right ventricular  size is normal. There is normal pulmonary artery systolic pressure.   3. The mitral valve is normal in structure. Trivial mitral valve  regurgitation. No evidence of mitral stenosis.   4. The aortic valve is tricuspid. There is mild thickening of the aortic  valve. Aortic valve regurgitation is not visualized.   Comparison(s): Prior images reviewed side by side. MR has improved   Risk Assessment/Calculations:       Physical Exam:   VS:  BP (!) 102/54  Pulse 68   Ht 5\' 3"  (1.6 m)   Wt 136 lb 9.6 oz (62 kg)   SpO2 95%   BMI 24.20 kg/m    Wt Readings from Last 3 Encounters:  01/31/23 136 lb 9.6 oz (62 kg)  01/26/23 124 lb 6 oz (56.4 kg)  01/10/23 129 lb 6.4 oz (58.7 kg)    Orthostatic VS for the past 24 hrs (Last 3 readings):  BP- Lying Pulse- Lying BP- Sitting Pulse- Sitting BP- Standing at 0 minutes Pulse- Standing at 0 minutes BP- Standing at 3 minutes Pulse- Standing at 3 minutes  01/31/23 1009 152/60 67 102/54 72 96/58 79 94/58 81    GEN: Well nourished, well developed in no acute distress; now has appearance of somewhat chronically ill-appearing.  She has a rash on the right side of her face with swelling and erythema.  This is mostly related to her otitis externa and is on antibiotics for this.  There was thought that it could be shingles but  that was excluded. NECK: No JVD; No carotid bruits CARDIAC: Normal S1, S2; RRR, no mur reindeer murs, rubs, gallops RESPIRATORY:  Clear to auscultation without rales, wheezing or rhonchi ; nonlabored, good air movement. ABDOMEN: Soft, non-tender, non-distended EXTREMITIES:  No edema; No deformity     ASSESSMENT AND PLAN: .    Problem List Items Addressed This Visit       Cardiology Problems   Autonomic orthostatic hypotension - Primary (Chronic)     Recent episodes of dizziness and a fall, with significant drop in blood pressure upon standing. Currently on multiple antihypertensives including amlodipine, carvedilol, and torsemide. -Reduce amlodipine dose from 10mg  to 5mg  daily. -Hold torsemide dose for one day (02/01/2023) and reassess. -Advise patient to hydrate and eat a snack before standing up. -Check blood pressure at home and hold carvedilol dose if blood pressure remains low.       Relevant Medications   amLODipine (NORVASC) 5 MG tablet   Coronary artery disease involving native coronary artery of native heart without angina pectoris (Chronic)    History of CABG in the LAD for severe ostial LAD disease that was evaluated only moderate on repeat cath.  LIMA is now atretic.  Has not had any angina, despite significant other comorbidities.  Plan: Continue Lipitor 80 mg daily along with carvedilol 6.25 mg twice daily. Additional BP control with Norvasc which can reduce to 5 mg daily because of orthostasis.  On Catapres patch but PRN Catapres for systolic blood pressure greater than 200  History of being on Plavix, which was stopped for hip surgery. Subsequent pulmonary embolism and GI bleeds. Currently off all anticoagulation. -Start Plavix every other day.       Relevant Medications   amLODipine (NORVASC) 5 MG tablet   Essential hypertension (Chronic)    Very labile blood pressures.  Today her supine blood pressures were 150s over 60s with a heart rate of 67.  However  seated and standing systolic pressures dropped below 100 mmHg . Will be very leery of being more aggressive with blood pressure control.  For now we will reduce Norvasc dose to 5 mg daily.      Relevant Medications   amLODipine (NORVASC) 5 MG tablet   Hyperlipidemia with target LDL less than 70 (Chronic)    Remains on atorvastatin.  Labs not been checked since 2022.  Will reorder labs.  Continue statin.      Relevant Medications   amLODipine (NORVASC) 5 MG tablet  Mesenteric artery stenosis (HCC) (Chronic)    Followed by vascular surgery along with the aneurysms.  Would like to restart Plavix, but with her GI bleeding issues, would start TID dosing.      Relevant Medications   amLODipine (NORVASC) 5 MG tablet     Other   CKD (chronic kidney disease) stage 4, GFR 15-29 ml/min (HCC) - baseline SCr 2.5. follows with Dr. Malen Gauze with [facility] (Chronic)    Progression of disease => Recent hospitalizations for severe kidney infection and sepsis. Currently under the care of a nephrologist., followed by Dr. Malen Gauze from pulmonary medicine.  Will defer blood pressure medicine titration to her likely her PCP because of frequency of visits. Is on Feraheme to help with her anemia that is exacerbated by GI bleeding.  Clearly not on ARB/ACE inhibitor or Arni. On stable dose of torsemide taking 20 mg daily we talked about additional doses for worsening edema.      Relevant Orders   Hepatic function panel   GI bleed (Chronic)    No longer on Eliquis for PE.  Has had no bleeding recently.  I think we are safe to try to restart Plavix but every other day.  If she has further issues, we would simply have to consider stopping Plavix      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; now atretic and significantly improved LAD lesion without intervention (Chronic)    Atretic LIMA with normal flow down the LAD.  No need for stress testing since she has had multiple stressors over the last year.       Relevant Orders   Hepatic function panel   Lipid panel   Severe obstructive sleep apnea (Chronic)    Followed by pulmonary medicine.  Now on CPAP.  Defer to pulmonary        Recent Skin Rash Presented with a new rash, possibly viral or bacterial in nature. Currently on amoxicillin and ear drops. -Continue current treatment plan.        Dispo: Return in about 6 months (around 08/01/2023). Follow-up in 6 months to reassess overall health status and possibly recheck echocardiogram.  Total time spent: 36 min spent with patient + 35 min spent charting = 71 min      Signed, [name], MD, MS Bryan Lemma, M.D., M.S. Interventional Cardiologist  [facility]  Pager # [phone] Phone # [phone] [address] [place], [place] [address]

## 2023-02-01 DIAGNOSIS — U071 COVID-19: Secondary | ICD-10-CM | POA: Diagnosis not present

## 2023-02-01 DIAGNOSIS — R278 Other lack of coordination: Secondary | ICD-10-CM | POA: Diagnosis not present

## 2023-02-01 DIAGNOSIS — Z9181 History of falling: Secondary | ICD-10-CM | POA: Diagnosis not present

## 2023-02-01 DIAGNOSIS — M6281 Muscle weakness (generalized): Secondary | ICD-10-CM | POA: Diagnosis not present

## 2023-02-04 ENCOUNTER — Encounter: Payer: Self-pay | Admitting: Cardiology

## 2023-02-04 DIAGNOSIS — I951 Orthostatic hypotension: Secondary | ICD-10-CM | POA: Insufficient documentation

## 2023-02-04 NOTE — Assessment & Plan Note (Signed)
Remains on atorvastatin.  Labs not been checked since 2022.  Will reorder labs.  Continue statin.

## 2023-02-04 NOTE — Assessment & Plan Note (Signed)
Atretic LIMA with normal flow down the LAD.  No need for stress testing since she has had multiple stressors over the last year.

## 2023-02-04 NOTE — Assessment & Plan Note (Signed)
Followed by vascular surgery along with the aneurysms.  Would like to restart Plavix, but with her GI bleeding issues, would start TID dosing.

## 2023-02-04 NOTE — Assessment & Plan Note (Addendum)
Progression of disease => Recent hospitalizations for severe kidney infection and sepsis. Currently under the care of a nephrologist., followed by Dr. Malen Gauze from pulmonary medicine.  Will defer blood pressure medicine titration to her likely her PCP because of frequency of visits. Is on Feraheme to help with her anemia that is exacerbated by GI bleeding.  Clearly not on ARB/ACE inhibitor or Arni. On stable dose of torsemide taking 20 mg daily we talked about additional doses for worsening edema.

## 2023-02-04 NOTE — Assessment & Plan Note (Signed)
  Recent episodes of dizziness and a fall, with significant drop in blood pressure upon standing. Currently on multiple antihypertensives including amlodipine, carvedilol, and torsemide. -Reduce amlodipine dose from 10mg  to 5mg  daily. -Hold torsemide dose for one day (02/01/2023) and reassess. -Advise patient to hydrate and eat a snack before standing up. -Check blood pressure at home and hold carvedilol dose if blood pressure remains low.

## 2023-02-04 NOTE — Assessment & Plan Note (Signed)
No longer on Eliquis for PE.  Has had no bleeding recently.  I think we are safe to try to restart Plavix but every other day.  If she has further issues, we would simply have to consider stopping Plavix

## 2023-02-04 NOTE — Assessment & Plan Note (Signed)
History of CABG in the LAD for severe ostial LAD disease that was evaluated only moderate on repeat cath.  LIMA is now atretic.  Has not had any angina, despite significant other comorbidities.  Plan: Continue Lipitor 80 mg daily along with carvedilol 6.25 mg twice daily. Additional BP control with Norvasc which can reduce to 5 mg daily because of orthostasis.  On Catapres patch but PRN Catapres for systolic blood pressure greater than 200  History of being on Plavix, which was stopped for hip surgery. Subsequent pulmonary embolism and GI bleeds. Currently off all anticoagulation. -Start Plavix every other day.

## 2023-02-04 NOTE — Assessment & Plan Note (Addendum)
Very labile blood pressures.  Today her supine blood pressures were 150s over 60s with a heart rate of 67.  However seated and standing systolic pressures dropped below 100 mmHg . Will be very leery of being more aggressive with blood pressure control.  For now we will reduce Norvasc dose to 5 mg daily.

## 2023-02-04 NOTE — Assessment & Plan Note (Signed)
Followed by pulmonary medicine.  Now on CPAP.  Defer to pulmonary

## 2023-02-05 DIAGNOSIS — J449 Chronic obstructive pulmonary disease, unspecified: Secondary | ICD-10-CM | POA: Diagnosis not present

## 2023-02-05 DIAGNOSIS — U071 COVID-19: Secondary | ICD-10-CM | POA: Diagnosis not present

## 2023-02-05 DIAGNOSIS — M6281 Muscle weakness (generalized): Secondary | ICD-10-CM | POA: Diagnosis not present

## 2023-02-05 DIAGNOSIS — R278 Other lack of coordination: Secondary | ICD-10-CM | POA: Diagnosis not present

## 2023-02-05 DIAGNOSIS — R4789 Other speech disturbances: Secondary | ICD-10-CM | POA: Diagnosis not present

## 2023-02-05 DIAGNOSIS — R2681 Unsteadiness on feet: Secondary | ICD-10-CM | POA: Diagnosis not present

## 2023-02-06 DIAGNOSIS — U071 COVID-19: Secondary | ICD-10-CM | POA: Diagnosis not present

## 2023-02-06 DIAGNOSIS — Z9181 History of falling: Secondary | ICD-10-CM | POA: Diagnosis not present

## 2023-02-06 DIAGNOSIS — M6281 Muscle weakness (generalized): Secondary | ICD-10-CM | POA: Diagnosis not present

## 2023-02-06 DIAGNOSIS — R278 Other lack of coordination: Secondary | ICD-10-CM | POA: Diagnosis not present

## 2023-02-07 DIAGNOSIS — R278 Other lack of coordination: Secondary | ICD-10-CM | POA: Diagnosis not present

## 2023-02-07 DIAGNOSIS — R2681 Unsteadiness on feet: Secondary | ICD-10-CM | POA: Diagnosis not present

## 2023-02-07 DIAGNOSIS — J449 Chronic obstructive pulmonary disease, unspecified: Secondary | ICD-10-CM | POA: Diagnosis not present

## 2023-02-07 DIAGNOSIS — M6281 Muscle weakness (generalized): Secondary | ICD-10-CM | POA: Diagnosis not present

## 2023-02-07 DIAGNOSIS — U071 COVID-19: Secondary | ICD-10-CM | POA: Diagnosis not present

## 2023-02-07 DIAGNOSIS — R4789 Other speech disturbances: Secondary | ICD-10-CM | POA: Diagnosis not present

## 2023-02-08 ENCOUNTER — Ambulatory Visit: Payer: Medicare PPO | Admitting: Internal Medicine

## 2023-02-08 ENCOUNTER — Encounter: Payer: Self-pay | Admitting: Internal Medicine

## 2023-02-08 VITALS — BP 130/70 | HR 85 | Temp 97.7°F | Ht 63.0 in | Wt 130.0 lb

## 2023-02-08 DIAGNOSIS — M6281 Muscle weakness (generalized): Secondary | ICD-10-CM | POA: Diagnosis not present

## 2023-02-08 DIAGNOSIS — N184 Chronic kidney disease, stage 4 (severe): Secondary | ICD-10-CM

## 2023-02-08 DIAGNOSIS — B029 Zoster without complications: Secondary | ICD-10-CM

## 2023-02-08 DIAGNOSIS — Z9181 History of falling: Secondary | ICD-10-CM | POA: Diagnosis not present

## 2023-02-08 DIAGNOSIS — I10 Essential (primary) hypertension: Secondary | ICD-10-CM | POA: Diagnosis not present

## 2023-02-08 DIAGNOSIS — R278 Other lack of coordination: Secondary | ICD-10-CM | POA: Diagnosis not present

## 2023-02-08 DIAGNOSIS — R519 Headache, unspecified: Secondary | ICD-10-CM | POA: Insufficient documentation

## 2023-02-08 DIAGNOSIS — G4451 Hemicrania continua: Secondary | ICD-10-CM

## 2023-02-08 DIAGNOSIS — U071 COVID-19: Secondary | ICD-10-CM | POA: Diagnosis not present

## 2023-02-08 MED ORDER — TRAMADOL HCL 50 MG PO TABS
50.0000 mg | ORAL_TABLET | Freq: Four times a day (QID) | ORAL | 0 refills | Status: DC | PRN
Start: 2023-02-08 — End: 2023-02-13

## 2023-02-08 MED ORDER — TORSEMIDE 20 MG PO TABS: 20.0000 mg | ORAL_TABLET | ORAL | 3 refills | Status: DC

## 2023-02-08 MED ORDER — VALACYCLOVIR HCL 1 G PO TABS: 1000.0000 mg | ORAL_TABLET | Freq: Three times a day (TID) | ORAL | 0 refills | Status: DC

## 2023-02-08 MED ORDER — METHYLPREDNISOLONE 4 MG PO TBPK: ORAL_TABLET | ORAL | 0 refills | Status: DC

## 2023-02-08 MED ORDER — ATORVASTATIN CALCIUM 80 MG PO TABS: 80.0000 mg | ORAL_TABLET | Freq: Every day | ORAL | 3 refills | Status: DC

## 2023-02-08 NOTE — Assessment & Plan Note (Addendum)
New L ear and L lower jaw pain x 10 days, rash on the L lower lip x7 days Due to H zoster left Valtrex Medrol pac Tramadol

## 2023-02-08 NOTE — Assessment & Plan Note (Addendum)
L Face Valtrex prescription given Medrol pac Tramadol as needed  Potential benefits of a short/long term opioids use as well as potential risks (i.e. addiction risk, apnea etc) and complications (i.e. Somnolence, constipation and others) were explained to the patient and were aknowledged.

## 2023-02-08 NOTE — Progress Notes (Signed)
Subjective:  Patient ID: [name], female    DOB: [birth year]  Age: 82 y.o. MRN: [id number]  CC: Follow-up (2 mnth f/u, Lt ear, neck and jaw pain x10 days)   HPI [name] presents for L ear and L lower jaw pain x 10 days, rash on the L lower lip x7 days Pain is constant 7/10   Outpatient Medications Prior to Visit  Medication Sig Dispense Refill   acetaminophen (TYLENOL) 325 MG tablet Take 2 tablets (650 mg total) by mouth 2 (two) times daily as needed (generalized pain). 100 tablet 2   albuterol (VENTOLIN HFA) 108 (90 Base) MCG/ACT inhaler Inhale 1-2 puffs into the lungs every 4 (four) hours as needed for wheezing or shortness of breath. (Patient taking differently: Inhale 1-2 puffs into the lungs as needed for wheezing or shortness of breath.) 1 each 2   ascorbic acid (VITAMIN C) 500 MG tablet Take 1 tablet (500 mg total) by mouth daily. 30 tablet 2   carvedilol (COREG) 6.25 MG tablet Take 1 tablet (6.25 mg total) by mouth 2 (two) times daily with a meal. 180 tablet 3   Cholecalciferol (VITAMIN D3) 50 MCG (2000 UT) capsule Take 1 capsule (2,000 Units total) by mouth daily. 100 capsule 3   cloNIDine (CATAPRES - DOSED IN MG/24 HR) 0.2 mg/24hr patch Place 1 patch (0.2 mg total) onto the skin once a week. 12 patch 3   clopidogrel (PLAVIX) 75 MG tablet Take 1 tablet (75 mg total) by mouth every other day. (Patient not taking: Reported on 02/27/2023) 90 tablet 3   Cyanocobalamin 2500 MCG TABS Take 2,500 mcg by mouth daily. 30 tablet 3   leflunomide (ARAVA) 20 MG tablet Take 1 tablet (20 mg total) by mouth daily. 30 tablet 1   levothyroxine (SYNTHROID) 88 MCG tablet Take 1 tablet (88 mcg total) by mouth daily before breakfast. 90 tablet 1   Multiple Vitamins-Minerals (CENTRUM ADULT PO) Take 1 tablet by mouth daily.     pantoprazole (PROTONIX) 40 MG tablet Take 1 tablet (40 mg total) by mouth daily. (Patient taking differently: Take 40 mg by mouth at bedtime.) 180 tablet 1   sertraline  (ZOLOFT) 50 MG tablet Take 0.5 tablets (25 mg total) by mouth daily.     triamcinolone (NASACORT) 55 MCG/ACT AERO nasal inhaler Place 1 spray into the nose daily. (Patient taking differently: Place 1 spray into the nose as needed (running nose).) 1 each 3   amLODipine (NORVASC) 5 MG tablet Take 1 tablet (5 mg total) by mouth daily. (Patient taking differently: Take 2.5 mg by mouth daily.) 90 tablet 3   atorvastatin (LIPITOR) 80 MG tablet Take 1 tablet (80 mg total) by mouth daily. 90 tablet 1   benzonatate (TESSALON) 100 MG capsule Take 1 capsule (100 mg total) by mouth 3 (three) times daily as needed for cough. (Patient not taking: Reported on 02/17/2023) 30 capsule 0   ciprofloxacin-dexamethasone (CIPRODEX) OTIC suspension Place 4 drops into the left ear 2 (two) times daily. 7.5 mL 0   doxycycline (VIBRA-TABS) 100 MG tablet Take 1 tablet (100 mg total) by mouth 2 (two) times daily. 20 tablet 0   Fexofenadine HCl (ALLERGY 24-HR PO) Take 1 tablet by mouth in the morning and at bedtime.     torsemide (DEMADEX) 20 MG tablet Take 1 tablet (20 mg total) by mouth See admin instructions. Take 20 mg (1 tablet) by mouth twice a week on Tuesday's and Saturday's. (Patient taking differently: Take 20  mg by mouth daily. Take 20 mg (1 tablet) by mouth twice a week on Tuesday's and Saturday's.) 90 tablet 3   amoxicillin (AMOXIL) 250 MG capsule Take 1 capsule (250 mg total) by mouth 2 (two) times daily. (Patient not taking: Reported on 02/08/2023) 14 capsule 0   cloNIDine (CATAPRES) 0.2 MG tablet Take 1 tablet (0.2 mg total) by mouth 4 (four) times daily as needed for up to 30 doses. For systolic blood pressure (top number) over 200 mmhg (Patient taking differently: Take 0.2 mg by mouth as needed. For systolic blood pressure (top number) over 200 mmhg) 100 tablet 3   No facility-administered medications prior to visit.    ROS: Review of Systems  Constitutional:  Negative for activity change, appetite change,  chills, fatigue and unexpected weight change.  HENT:  Positive for ear pain. Negative for congestion, mouth sores and sinus pressure.   Eyes:  Negative for visual disturbance.  Respiratory:  Negative for cough and chest tightness.   Gastrointestinal:  Negative for abdominal pain and nausea.  Genitourinary:  Negative for difficulty urinating, frequency and vaginal pain.  Musculoskeletal:  Negative for back pain and gait problem.  Skin:  Negative for pallor and rash.  Neurological:  Positive for headaches. Negative for dizziness, tremors, weakness and numbness.  Psychiatric/Behavioral:  Negative for confusion and sleep disturbance.     Objective:  BP 130/70 (BP Location: Left Arm, Patient Position: Sitting, Cuff Size: Normal)   Pulse 85   Temp 97.7 F (36.5 C) (Oral)   Ht 5\' 3"  (1.6 m)   Wt 130 lb (59 kg)   SpO2 94%   BMI 23.03 kg/m   BP Readings from Last 3 Encounters:  02/27/23 (!) 159/67  02/26/23 (!) 137/57  02/13/23 (!) 177/100    Wt Readings from Last 3 Encounters:  02/27/23 122 lb (55.3 kg)  02/25/23 126 lb 8.7 oz (57.4 kg)  02/09/23 124 lb (56.2 kg)    Physical Exam Constitutional:      General: She is not in acute distress.    Appearance: Normal appearance. She is well-developed.  HENT:     Head: Normocephalic.     Right Ear: External ear normal.     Left Ear: External ear normal.     Nose: Nose normal.  Eyes:     General:        Right eye: No discharge.        Left eye: No discharge.     Conjunctiva/sclera: Conjunctivae normal.     Pupils: Pupils are equal, round, and reactive to light.  Neck:     Thyroid: No thyromegaly.     Vascular: No JVD.     Trachea: No tracheal deviation.  Cardiovascular:     Rate and Rhythm: Normal rate and regular rhythm.     Heart sounds: Normal heart sounds.  Pulmonary:     Effort: No respiratory distress.     Breath sounds: No stridor. No wheezing.  Abdominal:     General: Bowel sounds are normal. There is no  distension.     Palpations: Abdomen is soft. There is no mass.     Tenderness: There is no abdominal tenderness. There is no guarding or rebound.  Musculoskeletal:        General: No tenderness.     Cervical back: Normal range of motion and neck supple. No rigidity.  Lymphadenopathy:     Cervical: No cervical adenopathy.  Skin:    Findings: No erythema or rash.  Neurological:     Cranial Nerves: No cranial nerve deficit.     Motor: No abnormal muscle tone.     Coordination: Coordination normal.     Deep Tendon Reflexes: Reflexes normal.  Psychiatric:        Behavior: Behavior normal.        Thought Content: Thought content normal.        Judgment: Judgment normal.   Left side of the face is tender.  Healing rash on the left upper lip area  Lab Results  Component Value Date   WBC 10.7 (H) 02/26/2023   HGB 10.3 (L) 02/26/2023   HCT 31.5 (L) 02/26/2023   PLT 366 02/26/2023   GLUCOSE 81 02/26/2023   CHOL 157 09/07/2020   TRIG 163 (H) 02/16/2023   HDL 59.30 09/07/2020   LDLDIRECT 162.1 10/28/2009   LDLCALC 74 09/07/2020   ALT 17 02/15/2023   AST 24 02/15/2023   NA 129 (L) 02/26/2023   K 4.3 02/26/2023   CL 96 (L) 02/26/2023   CREATININE 4.66 (H) 02/26/2023   BUN 100 (H) 02/26/2023   CO2 19 (L) 02/26/2023   TSH 2.829 02/15/2023   INR 1.0 02/15/2023    No results found.  Assessment & Plan:   Problem List Items Addressed This Visit     Essential hypertension (Chronic)    BP Readings from Last 3 Encounters:  02/27/23 (!) 159/67  02/26/23 (!) 137/57  02/13/23 (!) 177/100  Elevated blood pressure.  Treat pain  Renal artery [place] - [place] 09/2022 HTN: start Clonidine patch + Clonidine po prn      Relevant Medications   atorvastatin (LIPITOR) 80 MG tablet   CKD (chronic kidney disease) stage 4, GFR 15-29 ml/min (HCC) - baseline SCr 2.5. follows with Dr. Malen Gauze with [facility] (Chronic)    Meds may need adjustment in the future      Zoster - Primary    L  Face Valtrex prescription given Medrol pac Tramadol as needed  Potential benefits of a short/long term opioids use as well as potential risks (i.e. addiction risk, apnea etc) and complications (i.e. Somnolence, constipation and others) were explained to the patient and were aknowledged.         Headache    New L ear and L lower jaw pain x 10 days, rash on the L lower lip x7 days Due to H zoster left Valtrex Medrol pac Tramadol           Meds ordered this encounter  Medications   DISCONTD: torsemide (DEMADEX) 20 MG tablet    Sig: Take 1 tablet (20 mg total) by mouth See admin instructions. Take 20 mg (1 tablet) by mouth twice a week on Tuesday's and Saturday's.    Dispense:  90 tablet    Refill:  3   atorvastatin (LIPITOR) 80 MG tablet    Sig: Take 1 tablet (80 mg total) by mouth daily.    Dispense:  90 tablet    Refill:  3   DISCONTD: valACYclovir (VALTREX) 1000 MG tablet    Sig: Take 1 tablet (1,000 mg total) by mouth 3 (three) times daily.    Dispense:  21 tablet    Refill:  0   DISCONTD: methylPREDNISolone (MEDROL DOSEPAK) 4 MG TBPK tablet    Sig: As directed    Dispense:  21 tablet    Refill:  0   DISCONTD: traMADol (ULTRAM) 50 MG tablet    Sig: Take 1 tablet (50 mg total) by  mouth every 6 (six) hours as needed for up to 5 days for severe pain.    Dispense:  20 tablet    Refill:  0      Follow-up: Return in about 2 weeks (around 02/22/2023) for a follow-up visit.  Sonda Primes, MD

## 2023-02-09 ENCOUNTER — Ambulatory Visit: Payer: Medicare PPO | Admitting: Physician Assistant

## 2023-02-09 ENCOUNTER — Encounter: Payer: Self-pay | Admitting: Physician Assistant

## 2023-02-09 VITALS — BP 130/69 | HR 93 | Ht 63.0 in | Wt 124.0 lb

## 2023-02-09 DIAGNOSIS — D509 Iron deficiency anemia, unspecified: Secondary | ICD-10-CM

## 2023-02-09 DIAGNOSIS — K5900 Constipation, unspecified: Secondary | ICD-10-CM | POA: Diagnosis not present

## 2023-02-09 DIAGNOSIS — Z8719 Personal history of other diseases of the digestive system: Secondary | ICD-10-CM

## 2023-02-09 DIAGNOSIS — Z8711 Personal history of peptic ulcer disease: Secondary | ICD-10-CM

## 2023-02-09 NOTE — Progress Notes (Signed)
Agree with assessment and plan as outlined.  

## 2023-02-09 NOTE — Patient Instructions (Addendum)
_______________________________________________________  If your blood pressure at your visit was 140/90 or greater, please contact your primary care physician to follow up on this.  _______________________________________________________  If you are age 84 or older, your body mass index should be between 23-30. Your Body mass index is 21.97 kg/m. If this is out of the aforementioned range listed, please consider follow up with your Primary Care Provider.  If you are age 55 or younger, your body mass index should be between 19-25. Your Body mass index is 21.97 kg/m. If this is out of the aformentioned range listed, please consider follow up with your Primary Care Provider.   ________________________________________________________  The [facility] providers would like to encourage you to use [facility] to communicate with providers for non-urgent requests or questions.  Due to long hold times on the telephone, sending your provider a message by [facility] may be a faster and more efficient way to get a response.  Please allow 48 business hours for a response.  Please remember that this is for non-urgent requests.  _______________________________________________________  Please purchase the following medications over the counter and take as directed: Miralx daily  Continue Protonix 40mg  daily  Follow up as needed.  It was a pleasure to see you today!  Thank you for trusting me with your gastrointestinal care!

## 2023-02-09 NOTE — Progress Notes (Signed)
Chief Complaint: Follow up after hospitalization for GI Bleed  HPI:    [name] is an 82 year old Caucasian female with a past medical history as listed below including CAD, COPD, CKD, DVT and multiple others, known to Dr. Adela Lank, who presents to clinic today for follow-up after recently being followed in the hospital by our service for a lower GI bleed.    07/08/2022 EGD with 4 cm hiatal hernia, LA grade a esophagitis, 10 gastric ulcers and mucosal changes in the stomach.    11/28/2022 EGD with salmon-colored mucosa, hiatal hernia, gastritis and normal duodenum.    11/28/2022 colonoscopy with three 3-5 mm polyps in the transverse and ascending colon, diverticulosis in the sigmoid descending colon nonbleeding internal hemorrhoids.    12/08/2022-12/10/2022 patient followed by our service for recurrent painless hematochezia on Eliquis, had been hospitalized several days ago for the same and felt to have diverticular versus hemorrhoid bleed based on colonoscopy findings and clinical picture, hemoglobin was trended and patient treated for hemorrhoids.    01/30/2023 iron studies were normal.  Hemoglobin 10.4 (9.2 on 01/16/2023).    Today, patient presents to clinic and tells me she is feeling fairly well.  She is slightly constipated but has been forgetting to take her MiraLAX in the mornings.  Continues on Pantoprazole 40 mg daily and has no complaints of GI bleeding or abdominal pain.    Denies fever, chills or weight loss.  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 Most likely scar. Mild subendocardial ischemia. EF 71% LOW RISK.    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 Kellie Simmering   Pulmonary emboli [facility]) 02/06/2022   01/2022 PE/DVT post-op. Lower GI bleed     Treat edema  Start Eliquis at the reduced dose due to Creat>1.5 and age >48   Rheumatoid arthritis (HCC) 2011   Dr.Truslow; fracture knees, hands and wrists -    S/P CABG x 1 1996   CABG--LIMA-LAD for ostial LAD (not felt to be PCI amenable). EF NORMAL then; LIMA now atretic   Shortness of breath dyspnea    with exertion   Stroke (HCC) 11/2014   TIA    Urinary frequency     Past Surgical  History:  Procedure Laterality Date   ABDOMINAL AORTIC ANEURYSM REPAIR  1996   Complicated by mesenteric artery stenosis and splenic artery infarction with acquired Asplenia   APPENDECTOMY     BIOPSY  07/08/2022   Procedure: BIOPSY;  Surgeon: Benancio Deeds, MD;  Location: [facility] ENDOSCOPY;  Service: Gastroenterology;;   BIOPSY  11/28/2022   Procedure: BIOPSY;  Surgeon: [name], MD;  Location: [facility] ENDOSCOPY;  Service: Gastroenterology;;   [name]  07/2011   right foot   CARDIAC CATHETERIZATION  2005   (Most recent CATH) - ostial LAD lesion 20-30% (down from 90% initially). Atretic LIMA. Minimal disease the RCA and Circumflex system.   CARPAL TUNNEL  RELEASE Left    CATARACT EXTRACTION Bilateral    CERVICAL SPINE SURGERY     plate 1610 Dr. Wynetta Emery   COLONOSCOPY     COLONOSCOPY WITH PROPOFOL N/A 11/28/2022   Procedure: COLONOSCOPY WITH PROPOFOL;  Surgeon: [name], MD;  Location: [facility] ENDOSCOPY;  Service: Gastroenterology;  Laterality: N/A;   CORONARY ARTERY BYPASS GRAFT  1996   INCLUDED AN INTERNAL MAMMARY ARTERY TO THE LAD. EF WAS NORMAL   ESOPHAGOGASTRODUODENOSCOPY (EGD) WITH PROPOFOL N/A 07/08/2022   Procedure: ESOPHAGOGASTRODUODENOSCOPY (EGD) WITH PROPOFOL;  Surgeon: Benancio Deeds, MD;  Location: [facility] ENDOSCOPY;  Service: Gastroenterology;  Laterality: N/A;   ESOPHAGOGASTRODUODENOSCOPY (EGD) WITH PROPOFOL N/A 11/28/2022   Procedure: ESOPHAGOGASTRODUODENOSCOPY (EGD) WITH PROPOFOL;  Surgeon: [name], MD;  Location: [facility] ENDOSCOPY;  Service: Gastroenterology;  Laterality: N/A;   INGUINAL HERNIA REPAIR Right    LAPAROSCOPIC APPENDECTOMY N/A 06/28/2016   Procedure: APPENDECTOMY LAPAROSCOPIC;  Surgeon: [name], MD;  Location: [facility] ORS;  Service: General;  Laterality: N/A;   NM MYOVIEW LTD  06/2010   Fixed anteroseptal, apical and inferoapical defect with moderate size. Most likely scar. Mild subendocardial ischemia. EF 71% LOW RISK.    POLYPECTOMY  11/28/2022   Procedure: POLYPECTOMY;  Surgeon: [name], MD;  Location: [facility] ENDOSCOPY;  Service: Gastroenterology;;   SPLENECTOMY     TOTAL HIP ARTHROPLASTY Left 01/23/2022   Procedure: LEFT TOTAL HIP ARTHROPLASTY ANTERIOR APPROACH;  Surgeon: [name], MD;  Location: [facility] OR;  Service: Orthopedics;  Laterality: Left;  3-C   TRANSTHORACIC ECHOCARDIOGRAM  12/2014   [facility]: Normal LV size & function. EF 55-60%,    vagina polyp     VIDEO ASSISTED THORACOSCOPY (VATS)/WEDGE RESECTION Left 03/02/2015   Procedure: VIDEO ASSISTED THORACOSCOPY (VATS), MINI THORACOTOMY, LEFT UPPER LOBE WEDGE, TAKE DOWN OF INTERNAL MAMMARY LESIONS, PLACEMENT OF ON-Q PUMP;  Surgeon: [name], MD;  Location: [facility] OR;  Service: Thoracic;  Laterality: Left;   VIDEO BRONCHOSCOPY N/A 03/02/2015   Procedure: BRONCHOSCOPY;  Surgeon: [name], MD;  Location: [facility] OR;  Service: Thoracic;  Laterality: N/A;   VIDEO BRONCHOSCOPY WITH ENDOBRONCHIAL NAVIGATION N/A 10/08/2017   Procedure: VIDEO BRONCHOSCOPY WITH ENDOBRONCHIAL NAVIGATION WITH BIOPSIES OF LEFT UPPER LOBE AND LEFT LOWER LOBE;  Surgeon: [name], MD;  Location: [facility] OR;  Service: Thoracic;  Laterality: N/A;    Current Outpatient Medications  Medication Sig Dispense Refill   acetaminophen (TYLENOL) 325 MG tablet Take 2 tablets (650 mg total) by mouth 2 (two) times daily as needed (generalized pain). 100 tablet 2   albuterol (VENTOLIN HFA) 108 (90 Base) MCG/ACT inhaler Inhale 1-2 puffs into the lungs every 4 (four) hours as needed for wheezing or shortness of breath. 1 each 2   amLODipine (NORVASC) 5 MG tablet Take  1 tablet (5 mg total) by mouth daily. 90 tablet 3   ascorbic acid (VITAMIN C) 500 MG tablet Take 1 tablet (500 mg total) by mouth daily. 30 tablet 2   atorvastatin (LIPITOR) 80 MG tablet Take 1 tablet (80 mg total) by mouth daily. 90 tablet 3   benzonatate (TESSALON) 100 MG capsule Take 1 capsule (100 mg total) by mouth 3 (three) times daily as needed for cough. 30 capsule 0   carvedilol (COREG) 6.25 MG tablet Take 1 tablet (6.25 mg total) by mouth 2 (two) times daily with a meal. 180 tablet 3   Cholecalciferol (VITAMIN D3) 50 MCG (2000 UT) capsule Take 1 capsule (2,000 Units total) by mouth daily. 100 capsule 3   cloNIDine (CATAPRES - DOSED IN MG/24 HR) 0.2 mg/24hr patch Place 1 patch (0.2 mg total) onto the skin once a week. 12 patch 3   cloNIDine (CATAPRES) 0.2 MG tablet Take 1 tablet (0.2 mg total) by mouth 4 (four) times daily as needed for up to 30 doses. For systolic blood pressure (top number) over 200 mmhg 100 tablet 3   clopidogrel (PLAVIX) 75 MG tablet Take 1 tablet (75 mg total) by mouth every  other day. 90 tablet 3   Cyanocobalamin 2500 MCG TABS Take 2,500 mcg by mouth daily. 30 tablet 3   Fexofenadine HCl (ALLERGY 24-HR PO) Take 1 tablet by mouth in the morning and at bedtime.     leflunomide (ARAVA) 20 MG tablet Take 1 tablet (20 mg total) by mouth daily. 30 tablet 1   levothyroxine (SYNTHROID) 88 MCG tablet Take 1 tablet (88 mcg total) by mouth daily before breakfast. 90 tablet 1   methylPREDNISolone (MEDROL DOSEPAK) 4 MG TBPK tablet As directed 21 tablet 0   Multiple Vitamins-Minerals (CENTRUM ADULT PO) Take 1 tablet by mouth daily. 1 Tablet Daily.     pantoprazole (PROTONIX) 40 MG tablet Take 1 tablet (40 mg total) by mouth daily. 180 tablet 1   sertraline (ZOLOFT) 50 MG tablet Take 0.5 tablets (25 mg total) by mouth daily.     torsemide (DEMADEX) 20 MG tablet Take 1 tablet (20 mg total) by mouth See admin instructions. Take 20 mg (1 tablet) by mouth twice a week on Tuesday's and Saturday's. (Patient taking differently: Take 20 mg by mouth See admin instructions. Take 20 mg (1 tablet) daily.) 90 tablet 3   traMADol (ULTRAM) 50 MG tablet Take 1 tablet (50 mg total) by mouth every 6 (six) hours as needed for up to 5 days for severe pain. 20 tablet 0   triamcinolone (NASACORT) 55 MCG/ACT AERO nasal inhaler Place 1 spray into the nose daily. 1 each 3   valACYclovir (VALTREX) 1000 MG tablet Take 1 tablet (1,000 mg total) by mouth 3 (three) times daily. 21 tablet 0   No current facility-administered medications for this visit.    Allergies as of 02/09/2023 - Review Complete 02/09/2023  Allergen Reaction Noted   Nsaids Other (See Comments) 01/16/2022   Aspirin Other (See Comments)    Codeine Nausea And Vomiting    Nitrostat [nitroglycerin] Other (See Comments)     Family History  Problem Relation Age of Onset   Hypertension Mother    Diabetes Mother    Heart disease Mother    Hyperlipidemia Mother    Stroke Father    Hyperlipidemia Sister    Hypertension Sister     Hypertension Daughter    Cancer Paternal Uncle        Deceased from  cancer not sure of site   Hypertension Son    Hyperlipidemia Son    Hyperlipidemia Son    Hypertension Son    Hyperlipidemia Son    Hypertension Son    Hypertension Other    Coronary artery disease Other    Asthma Neg Hx    Colon cancer Neg Hx     Social History   Socioeconomic History   Marital status: Married    Spouse name: Not on file   Number of children: 4   Years of education: Not on file   Highest education level: Bachelor's degree (e.g., BA, AB, BS)  Occupational History   Occupation: retired    Associate Professor: RETIRED  Tobacco Use   Smoking status: Former    Current packs/day: 0.00    Types: Cigarettes    Quit date: 12/04/1958    Years since quitting: 64.2    Passive exposure: Never   Smokeless tobacco: Never  Vaping Use   Vaping status: Never Used  Substance and Sexual Activity   Alcohol use: Yes    Comment: hardly ever   Drug use: No   Sexual activity: Not Currently  Other Topics Concern   Not on file  Social History Narrative   [facility]  MARRIED -RETIRED ; 4 CHILDREN,2 CHILDREN.    Living at [facility] since dec 2015   Social Determinants of Health   Financial Resource Strain: Low Risk  (09/05/2022)   Overall Financial Resource Strain (CARDIA)    Difficulty of Paying Living Expenses: Not hard at all  Food Insecurity: No Food Insecurity (12/08/2022)   Hunger Vital Sign    Worried About Running Out of Food in the Last Year: Never true    Ran Out of Food in the Last Year: Never true  Transportation Needs: No Transportation Needs (12/08/2022)   PRAPARE - Administrator, Civil Service (Medical): No    Lack of Transportation (Non-Medical): No  Physical Activity: Insufficiently Active (01/22/2023)   Exercise Vital Sign    Days of Exercise per Week: 2 days    Minutes of Exercise per Session: 10 min  Stress: Stress Concern Present (01/22/2023)   [employer] of Occupational Health - Occupational Stress Questionnaire    Feeling of Stress : To some extent  Social Connections: Moderately Integrated (09/05/2022)   Social Connection and Isolation Panel [NHANES]    Frequency of Communication with Friends and Family: More than three times a week    Frequency of Social Gatherings with Friends and Family: Patient declined    Attends Religious Services: Never    Database administrator or Organizations: Yes    Attends Banker Meetings: Patient declined    Marital Status: Married  Catering manager Violence: Not At Risk (12/08/2022)   Humiliation, Afraid, Rape, and Kick questionnaire    Fear of Current or Ex-Partner: No    Emotionally Abused: No    Physically Abused: No    Sexually Abused: No    Review of Systems:    Constitutional: No weight loss, fever or chills Cardiovascular: No chest pain  Respiratory: No SOB  Gastrointestinal: See HPI and otherwise negative   Physical Exam:  Vital signs: BP 130/69   Pulse 93   Ht 5\' 3"  (1.6 m)   Wt 124 lb (56.2 kg)   BMI 21.97 kg/m    Constitutional:   Pleasant Elderly Caucasian female appears to be in NAD, Well developed, Well nourished, alert  and cooperative Respiratory: Respirations even and unlabored. Lungs clear to auscultation bilaterally.   No wheezes, crackles, or rhonchi.  Cardiovascular: Normal S1, S2. No MRG. Regular rate and rhythm. No peripheral edema, cyanosis or pallor.  Gastrointestinal:  Soft, nondistended, nontender. No rebound or guarding. Normal bowel sounds. No appreciable masses or hepatomegaly. Rectal:  Not performed.  Psychiatric: Demonstrates good judgement and reason without abnormal affect or behaviors.  RELEVANT LABS AND IMAGING: CBC    Component Value Date/Time   WBC 10.3 12/10/2022 0135   RBC 2.60 (L) 12/10/2022 0135   HGB 10.4 (L) 01/30/2023 1342   HCT 26.4 (L) 12/10/2022 0135   PLT 354 12/10/2022 0135   MCV 101.5 (H) 12/10/2022 0135   MCV 90.9  01/29/2015 1524   MCH 31.2 12/10/2022 0135   MCHC 30.7 12/10/2022 0135   RDW 17.0 (H) 12/10/2022 0135   LYMPHSABS 2.0 11/27/2022 0105   MONOABS 1.4 (H) 11/27/2022 0105   EOSABS 0.4 11/27/2022 0105   BASOSABS 0.2 (H) 11/27/2022 0105    CMP     Component Value Date/Time   NA 138 12/09/2022 0150   NA 141 08/01/2022 0800   K 4.5 12/09/2022 0150   CL 104 12/09/2022 0150   CO2 21 (L) 12/09/2022 0150   GLUCOSE 114 (H) 12/09/2022 0150   BUN 62 (H) 12/09/2022 0150   BUN 57 (A) 08/01/2022 0800   CREATININE 3.54 (H) 12/09/2022 0150   CREATININE 2.19 (H) 12/26/2019 1005   CALCIUM 9.0 12/09/2022 0150   PROT 6.1 (L) 12/07/2022 2140   ALBUMIN 3.2 (L) 12/07/2022 2140   AST 29 12/07/2022 2140   ALT 17 12/07/2022 2140   ALKPHOS 85 12/07/2022 2140   BILITOT 1.2 12/07/2022 2140   GFRNONAA 12 (L) 12/09/2022 0150   GFRNONAA 20 (L) 12/26/2019 1005   GFRAA 24 (L) 12/26/2019 1005    Assessment: 1.  GI bleed: recently in the hospital for GI bleed thought related to hemorrhoids, no further bleeding and hemoglobin is returning to normal 2.  History of gastric ulcers: On EGD in March, they have healed on EGD in July  Plan: 1.  Continue Pantoprazole 40 mg daily, patient does not need refill she says. 2.  Would restart MiraLAX in the mornings. 3.  Patient to follow in clinic with [place] as needed.  Hyacinth Meeker, PA-C [facility] 02/09/2023, 10:14 AM  Cc: [name], MD

## 2023-02-11 ENCOUNTER — Encounter: Payer: Self-pay | Admitting: Internal Medicine

## 2023-02-13 ENCOUNTER — Encounter (HOSPITAL_COMMUNITY): Payer: Medicare PPO

## 2023-02-13 ENCOUNTER — Encounter (HOSPITAL_COMMUNITY)
Admission: RE | Admit: 2023-02-13 | Discharge: 2023-02-13 | Disposition: A | Discharge status: Home / Self Care | Payer: Medicare PPO | Source: Ambulatory Visit | Attending: Nephrology

## 2023-02-13 ENCOUNTER — Encounter: Payer: Self-pay | Admitting: Internal Medicine

## 2023-02-13 ENCOUNTER — Ambulatory Visit: Payer: Self-pay

## 2023-02-13 ENCOUNTER — Other Ambulatory Visit: Payer: Self-pay | Admitting: Internal Medicine

## 2023-02-13 VITALS — BP 177/100 | HR 77 | Temp 97.5°F | Resp 17

## 2023-02-13 DIAGNOSIS — I10 Essential (primary) hypertension: Secondary | ICD-10-CM | POA: Diagnosis not present

## 2023-02-13 DIAGNOSIS — W1830XA Fall on same level, unspecified, initial encounter: Secondary | ICD-10-CM | POA: Diagnosis present

## 2023-02-13 DIAGNOSIS — I160 Hypertensive urgency: Secondary | ICD-10-CM | POA: Diagnosis present

## 2023-02-13 DIAGNOSIS — R140 Abdominal distension (gaseous): Secondary | ICD-10-CM | POA: Diagnosis not present

## 2023-02-13 DIAGNOSIS — I70201 Unspecified atherosclerosis of native arteries of extremities, right leg: Secondary | ICD-10-CM | POA: Diagnosis not present

## 2023-02-13 DIAGNOSIS — W19XXXA Unspecified fall, initial encounter: Secondary | ICD-10-CM | POA: Diagnosis not present

## 2023-02-13 DIAGNOSIS — R29810 Facial weakness: Secondary | ICD-10-CM | POA: Diagnosis present

## 2023-02-13 DIAGNOSIS — E875 Hyperkalemia: Secondary | ICD-10-CM | POA: Diagnosis present

## 2023-02-13 DIAGNOSIS — M25561 Pain in right knee: Secondary | ICD-10-CM | POA: Diagnosis present

## 2023-02-13 DIAGNOSIS — I252 Old myocardial infarction: Secondary | ICD-10-CM | POA: Diagnosis not present

## 2023-02-13 DIAGNOSIS — I674 Hypertensive encephalopathy: Secondary | ICD-10-CM | POA: Diagnosis present

## 2023-02-13 DIAGNOSIS — N184 Chronic kidney disease, stage 4 (severe): Secondary | ICD-10-CM

## 2023-02-13 DIAGNOSIS — R109 Unspecified abdominal pain: Secondary | ICD-10-CM | POA: Diagnosis not present

## 2023-02-13 DIAGNOSIS — M109 Gout, unspecified: Secondary | ICD-10-CM | POA: Diagnosis present

## 2023-02-13 DIAGNOSIS — K449 Diaphragmatic hernia without obstruction or gangrene: Secondary | ICD-10-CM | POA: Diagnosis not present

## 2023-02-13 DIAGNOSIS — N189 Chronic kidney disease, unspecified: Secondary | ICD-10-CM | POA: Diagnosis not present

## 2023-02-13 DIAGNOSIS — E872 Acidosis, unspecified: Secondary | ICD-10-CM | POA: Diagnosis present

## 2023-02-13 DIAGNOSIS — E039 Hypothyroidism, unspecified: Secondary | ICD-10-CM | POA: Diagnosis present

## 2023-02-13 DIAGNOSIS — M25461 Effusion, right knee: Secondary | ICD-10-CM | POA: Diagnosis not present

## 2023-02-13 DIAGNOSIS — R471 Dysarthria and anarthria: Secondary | ICD-10-CM | POA: Diagnosis present

## 2023-02-13 DIAGNOSIS — T68XXXA Hypothermia, initial encounter: Secondary | ICD-10-CM | POA: Diagnosis not present

## 2023-02-13 DIAGNOSIS — M353 Polymyalgia rheumatica: Secondary | ICD-10-CM | POA: Diagnosis present

## 2023-02-13 DIAGNOSIS — Z7401 Bed confinement status: Secondary | ICD-10-CM | POA: Diagnosis not present

## 2023-02-13 DIAGNOSIS — D649 Anemia, unspecified: Secondary | ICD-10-CM | POA: Diagnosis not present

## 2023-02-13 DIAGNOSIS — B961 Klebsiella pneumoniae [K. pneumoniae] as the cause of diseases classified elsewhere: Secondary | ICD-10-CM | POA: Diagnosis present

## 2023-02-13 DIAGNOSIS — R5383 Other fatigue: Secondary | ICD-10-CM | POA: Diagnosis not present

## 2023-02-13 DIAGNOSIS — M129 Arthropathy, unspecified: Secondary | ICD-10-CM | POA: Diagnosis not present

## 2023-02-13 DIAGNOSIS — I120 Hypertensive chronic kidney disease with stage 5 chronic kidney disease or end stage renal disease: Secondary | ICD-10-CM | POA: Diagnosis present

## 2023-02-13 DIAGNOSIS — R4781 Slurred speech: Secondary | ICD-10-CM | POA: Diagnosis not present

## 2023-02-13 DIAGNOSIS — G4733 Obstructive sleep apnea (adult) (pediatric): Secondary | ICD-10-CM | POA: Diagnosis not present

## 2023-02-13 DIAGNOSIS — B029 Zoster without complications: Secondary | ICD-10-CM | POA: Diagnosis present

## 2023-02-13 DIAGNOSIS — N390 Urinary tract infection, site not specified: Secondary | ICD-10-CM | POA: Diagnosis not present

## 2023-02-13 DIAGNOSIS — I700 Atherosclerosis of aorta: Secondary | ICD-10-CM | POA: Diagnosis not present

## 2023-02-13 DIAGNOSIS — E871 Hypo-osmolality and hyponatremia: Secondary | ICD-10-CM | POA: Diagnosis present

## 2023-02-13 DIAGNOSIS — K5730 Diverticulosis of large intestine without perforation or abscess without bleeding: Secondary | ICD-10-CM | POA: Diagnosis not present

## 2023-02-13 DIAGNOSIS — R9089 Other abnormal findings on diagnostic imaging of central nervous system: Secondary | ICD-10-CM | POA: Diagnosis not present

## 2023-02-13 DIAGNOSIS — M6281 Muscle weakness (generalized): Secondary | ICD-10-CM | POA: Diagnosis not present

## 2023-02-13 DIAGNOSIS — J432 Centrilobular emphysema: Secondary | ICD-10-CM | POA: Diagnosis not present

## 2023-02-13 DIAGNOSIS — I2510 Atherosclerotic heart disease of native coronary artery without angina pectoris: Secondary | ICD-10-CM | POA: Diagnosis not present

## 2023-02-13 DIAGNOSIS — R569 Unspecified convulsions: Secondary | ICD-10-CM | POA: Diagnosis not present

## 2023-02-13 DIAGNOSIS — I161 Hypertensive emergency: Secondary | ICD-10-CM | POA: Diagnosis not present

## 2023-02-13 DIAGNOSIS — M069 Rheumatoid arthritis, unspecified: Secondary | ICD-10-CM | POA: Diagnosis present

## 2023-02-13 DIAGNOSIS — R531 Weakness: Secondary | ICD-10-CM | POA: Diagnosis not present

## 2023-02-13 DIAGNOSIS — M1711 Unilateral primary osteoarthritis, right knee: Secondary | ICD-10-CM | POA: Diagnosis not present

## 2023-02-13 DIAGNOSIS — R4182 Altered mental status, unspecified: Secondary | ICD-10-CM | POA: Diagnosis not present

## 2023-02-13 DIAGNOSIS — K8020 Calculus of gallbladder without cholecystitis without obstruction: Secondary | ICD-10-CM | POA: Diagnosis not present

## 2023-02-13 DIAGNOSIS — N179 Acute kidney failure, unspecified: Secondary | ICD-10-CM | POA: Diagnosis present

## 2023-02-13 DIAGNOSIS — N185 Chronic kidney disease, stage 5: Secondary | ICD-10-CM | POA: Diagnosis present

## 2023-02-13 DIAGNOSIS — R600 Localized edema: Secondary | ICD-10-CM | POA: Diagnosis not present

## 2023-02-13 DIAGNOSIS — K5901 Slow transit constipation: Secondary | ICD-10-CM | POA: Diagnosis not present

## 2023-02-13 DIAGNOSIS — B0229 Other postherpetic nervous system involvement: Secondary | ICD-10-CM | POA: Diagnosis not present

## 2023-02-13 DIAGNOSIS — Z981 Arthrodesis status: Secondary | ICD-10-CM | POA: Diagnosis not present

## 2023-02-13 DIAGNOSIS — R0989 Other specified symptoms and signs involving the circulatory and respiratory systems: Secondary | ICD-10-CM | POA: Diagnosis not present

## 2023-02-13 DIAGNOSIS — R680 Hypothermia, not associated with low environmental temperature: Secondary | ICD-10-CM | POA: Diagnosis present

## 2023-02-13 DIAGNOSIS — Z66 Do not resuscitate: Secondary | ICD-10-CM | POA: Diagnosis present

## 2023-02-13 DIAGNOSIS — N261 Atrophy of kidney (terminal): Secondary | ICD-10-CM | POA: Diagnosis not present

## 2023-02-13 DIAGNOSIS — G8324 Monoplegia of upper limb affecting left nondominant side: Secondary | ICD-10-CM | POA: Diagnosis present

## 2023-02-13 DIAGNOSIS — R41841 Cognitive communication deficit: Secondary | ICD-10-CM | POA: Diagnosis not present

## 2023-02-13 DIAGNOSIS — Z96642 Presence of left artificial hip joint: Secondary | ICD-10-CM | POA: Diagnosis not present

## 2023-02-13 DIAGNOSIS — J449 Chronic obstructive pulmonary disease, unspecified: Secondary | ICD-10-CM | POA: Diagnosis present

## 2023-02-13 DIAGNOSIS — S0990XA Unspecified injury of head, initial encounter: Secondary | ICD-10-CM | POA: Diagnosis not present

## 2023-02-13 DIAGNOSIS — E785 Hyperlipidemia, unspecified: Secondary | ICD-10-CM | POA: Diagnosis present

## 2023-02-13 DIAGNOSIS — K7689 Other specified diseases of liver: Secondary | ICD-10-CM | POA: Diagnosis not present

## 2023-02-13 LAB — POCT HEMOGLOBIN-HEMACUE: Hemoglobin: 11.9 g/dL — ABNORMAL LOW (ref 12.0–15.0)

## 2023-02-13 MED ORDER — EPOETIN ALFA-EPBX 10000 UNIT/ML IJ SOLN
20000.0000 [IU] | INTRAMUSCULAR | Status: DC
  Administered 2023-02-13: 20000 [IU] via SUBCUTANEOUS

## 2023-02-13 MED ORDER — EPOETIN ALFA-EPBX 10000 UNIT/ML IJ SOLN
INTRAMUSCULAR | Status: AC
  Filled 2023-02-13: qty 2

## 2023-02-13 NOTE — Patient Instructions (Signed)
Visit Information  Thank you for taking time to visit with me today. Please don't hesitate to contact me if I can be of assistance to you.   Following are the goals we discussed today:  Contact provider with medication questions/concerns.  Continue to attend provider visits as scheduled Continue to eat healthy, lean meats, vegetables, fruits, avoid saturated and transfats Continue to check blood pressure routinely and contact provider if questions or concerns Contact Provider with health questions or concerns as needed.  Our next appointment is by telephone on 02/19/23 at 10:00 am  Please call the care guide team at [phone] if you need to cancel or reschedule your appointment.   If you are experiencing a Mental Health or Behavioral Health Crisis or need someone to talk to, please call the Suicide and Crisis Lifeline: 988 call the [place] National Suicide Prevention Lifeline: 9495154047 or TTY: (757) 696-3590 TTY 989-512-6621) to talk to a trained counselor call 1-800-273-TALK (toll free, 24 hour hotline)  Kathyrn Sheriff, RN, MSN, BSN, CCM Care Management Coordinator [phone]

## 2023-02-13 NOTE — Patient Outreach (Signed)
Care Coordination   Follow Up Visit Note   02/13/2023 Name: [name] MRN: [id number] DOB: 08-17-1940  [name] is a 82 y.o. year old female who sees Plotnikov, Georgina Quint, MD for primary care. I spoke with  [name](spouse, and [employer]) on speaker phone, today.  What matters to the patients health and wellness today?  "I have a real bad case of shingles". Patient reports received PCP's message this morning to stop Tramadol. She reports she has not taken valyclovir or prednisone in a couple of days. However she reports she did take the Tramadol last night due to the discomfort. She reports she was able to get some sleep and did not have symptoms of confusion. She has not taken valacyclovir or prednisone for couple days. She questions if Valacyclovir or Prednisone may have been the cause of the confusion, but plans to restart the Valacyclovir. Patient states she will contact provider regarding her medication questions.  Goals Addressed             This Visit's Progress    Health management       Interventions Today    Flowsheet Row Most Recent Value  Chronic Disease   Chronic disease during today's visit Hypertension (HTN), Chronic Kidney Disease/End Stage Renal Disease (ESRD)  General Interventions   General Interventions Discussed/Reviewed General Interventions Reviewed, Communication with  [Evaluation of current treatment plan for health condition and patient's adherence to plan.]  Communication with PCP/Specialists  [update primary care regarding patient's medicatin questions/concerns.]  Education Interventions   Education Provided Provided Education  Provided Verbal Education On When to see the doctor, Medication  [advised patient to contact provider with questions regarding medications. advised to attend provider appointments as scheduled,]  Nutrition Interventions   Nutrition Discussed/Reviewed Nutrition Reviewed  Pharmacy Interventions   Pharmacy  Dicussed/Reviewed Pharmacy Topics Reviewed, Medications and their functions, Medication Adherence  [medication reviewed]  Safety Interventions   Safety Discussed/Reviewed Fall Risk, Safety Reviewed             SDOH assessments and interventions completed:  No  Care Coordination Interventions:  No, not indicated   Follow up plan: Follow up call scheduled for 02/19/23    Encounter Outcome:  Patient Visit Completed   Kathyrn Sheriff, RN, MSN, BSN, CCM Care Management Coordinator [phone]

## 2023-02-15 ENCOUNTER — Inpatient Hospital Stay (HOSPITAL_COMMUNITY): Payer: Medicare PPO

## 2023-02-15 ENCOUNTER — Emergency Department (HOSPITAL_COMMUNITY): Payer: Medicare PPO

## 2023-02-15 ENCOUNTER — Inpatient Hospital Stay (HOSPITAL_COMMUNITY)
Admission: EM | Admit: 2023-02-15 | Discharge: 2023-02-26 | DRG: 305 | Disposition: A | Discharge status: Skilled Nursing Facility | Payer: Medicare PPO | Attending: Internal Medicine | Admitting: Internal Medicine

## 2023-02-15 DIAGNOSIS — J449 Chronic obstructive pulmonary disease, unspecified: Secondary | ICD-10-CM | POA: Diagnosis present

## 2023-02-15 DIAGNOSIS — R680 Hypothermia, not associated with low environmental temperature: Secondary | ICD-10-CM | POA: Diagnosis present

## 2023-02-15 DIAGNOSIS — Z79890 Hormone replacement therapy (postmenopausal): Secondary | ICD-10-CM

## 2023-02-15 DIAGNOSIS — I674 Hypertensive encephalopathy: Secondary | ICD-10-CM | POA: Diagnosis present

## 2023-02-15 DIAGNOSIS — R569 Unspecified convulsions: Secondary | ICD-10-CM

## 2023-02-15 DIAGNOSIS — N390 Urinary tract infection, site not specified: Secondary | ICD-10-CM | POA: Diagnosis not present

## 2023-02-15 DIAGNOSIS — Z8673 Personal history of transient ischemic attack (TIA), and cerebral infarction without residual deficits: Secondary | ICD-10-CM

## 2023-02-15 DIAGNOSIS — Z66 Do not resuscitate: Secondary | ICD-10-CM | POA: Diagnosis present

## 2023-02-15 DIAGNOSIS — Z83438 Family history of other disorder of lipoprotein metabolism and other lipidemia: Secondary | ICD-10-CM

## 2023-02-15 DIAGNOSIS — I252 Old myocardial infarction: Secondary | ICD-10-CM

## 2023-02-15 DIAGNOSIS — Z8249 Family history of ischemic heart disease and other diseases of the circulatory system: Secondary | ICD-10-CM

## 2023-02-15 DIAGNOSIS — I120 Hypertensive chronic kidney disease with stage 5 chronic kidney disease or end stage renal disease: Secondary | ICD-10-CM | POA: Diagnosis present

## 2023-02-15 DIAGNOSIS — E039 Hypothyroidism, unspecified: Secondary | ICD-10-CM | POA: Diagnosis present

## 2023-02-15 DIAGNOSIS — E785 Hyperlipidemia, unspecified: Secondary | ICD-10-CM | POA: Diagnosis present

## 2023-02-15 DIAGNOSIS — Z8679 Personal history of other diseases of the circulatory system: Secondary | ICD-10-CM

## 2023-02-15 DIAGNOSIS — M25561 Pain in right knee: Secondary | ICD-10-CM | POA: Diagnosis present

## 2023-02-15 DIAGNOSIS — E871 Hypo-osmolality and hyponatremia: Secondary | ICD-10-CM | POA: Diagnosis present

## 2023-02-15 DIAGNOSIS — N185 Chronic kidney disease, stage 5: Secondary | ICD-10-CM | POA: Diagnosis present

## 2023-02-15 DIAGNOSIS — Z951 Presence of aortocoronary bypass graft: Secondary | ICD-10-CM

## 2023-02-15 DIAGNOSIS — Z7902 Long term (current) use of antithrombotics/antiplatelets: Secondary | ICD-10-CM

## 2023-02-15 DIAGNOSIS — B029 Zoster without complications: Secondary | ICD-10-CM | POA: Diagnosis present

## 2023-02-15 DIAGNOSIS — Z9081 Acquired absence of spleen: Secondary | ICD-10-CM

## 2023-02-15 DIAGNOSIS — M25461 Effusion, right knee: Secondary | ICD-10-CM | POA: Diagnosis present

## 2023-02-15 DIAGNOSIS — R140 Abdominal distension (gaseous): Secondary | ICD-10-CM | POA: Diagnosis not present

## 2023-02-15 DIAGNOSIS — M353 Polymyalgia rheumatica: Secondary | ICD-10-CM | POA: Diagnosis present

## 2023-02-15 DIAGNOSIS — N179 Acute kidney failure, unspecified: Secondary | ICD-10-CM | POA: Diagnosis present

## 2023-02-15 DIAGNOSIS — K5900 Constipation, unspecified: Secondary | ICD-10-CM | POA: Diagnosis not present

## 2023-02-15 DIAGNOSIS — E872 Acidosis, unspecified: Secondary | ICD-10-CM | POA: Diagnosis present

## 2023-02-15 DIAGNOSIS — Z888 Allergy status to other drugs, medicaments and biological substances status: Secondary | ICD-10-CM

## 2023-02-15 DIAGNOSIS — I160 Hypertensive urgency: Secondary | ICD-10-CM | POA: Diagnosis present

## 2023-02-15 DIAGNOSIS — R471 Dysarthria and anarthria: Secondary | ICD-10-CM | POA: Diagnosis present

## 2023-02-15 DIAGNOSIS — W1830XA Fall on same level, unspecified, initial encounter: Secondary | ICD-10-CM | POA: Diagnosis present

## 2023-02-15 DIAGNOSIS — M069 Rheumatoid arthritis, unspecified: Secondary | ICD-10-CM | POA: Diagnosis present

## 2023-02-15 DIAGNOSIS — M109 Gout, unspecified: Secondary | ICD-10-CM | POA: Diagnosis present

## 2023-02-15 DIAGNOSIS — Z96642 Presence of left artificial hip joint: Secondary | ICD-10-CM | POA: Diagnosis present

## 2023-02-15 DIAGNOSIS — I2510 Atherosclerotic heart disease of native coronary artery without angina pectoris: Secondary | ICD-10-CM | POA: Diagnosis present

## 2023-02-15 DIAGNOSIS — E875 Hyperkalemia: Secondary | ICD-10-CM | POA: Diagnosis present

## 2023-02-15 DIAGNOSIS — Z860101 Personal history of adenomatous and serrated colon polyps: Secondary | ICD-10-CM

## 2023-02-15 DIAGNOSIS — R29810 Facial weakness: Secondary | ICD-10-CM | POA: Diagnosis present

## 2023-02-15 DIAGNOSIS — I951 Orthostatic hypotension: Secondary | ICD-10-CM | POA: Diagnosis not present

## 2023-02-15 DIAGNOSIS — B961 Klebsiella pneumoniae [K. pneumoniae] as the cause of diseases classified elsewhere: Secondary | ICD-10-CM | POA: Diagnosis present

## 2023-02-15 DIAGNOSIS — Z7951 Long term (current) use of inhaled steroids: Secondary | ICD-10-CM

## 2023-02-15 DIAGNOSIS — G8324 Monoplegia of upper limb affecting left nondominant side: Secondary | ICD-10-CM | POA: Diagnosis present

## 2023-02-15 DIAGNOSIS — Z86718 Personal history of other venous thrombosis and embolism: Secondary | ICD-10-CM

## 2023-02-15 DIAGNOSIS — Z833 Family history of diabetes mellitus: Secondary | ICD-10-CM

## 2023-02-15 DIAGNOSIS — Z86711 Personal history of pulmonary embolism: Secondary | ICD-10-CM

## 2023-02-15 DIAGNOSIS — Z885 Allergy status to narcotic agent status: Secondary | ICD-10-CM

## 2023-02-15 DIAGNOSIS — Z79899 Other long term (current) drug therapy: Secondary | ICD-10-CM

## 2023-02-15 DIAGNOSIS — I161 Hypertensive emergency: Principal | ICD-10-CM

## 2023-02-15 DIAGNOSIS — M436 Torticollis: Secondary | ICD-10-CM | POA: Diagnosis present

## 2023-02-15 DIAGNOSIS — T68XXXA Hypothermia, initial encounter: Secondary | ICD-10-CM

## 2023-02-15 DIAGNOSIS — Z823 Family history of stroke: Secondary | ICD-10-CM

## 2023-02-15 DIAGNOSIS — Z87891 Personal history of nicotine dependence: Secondary | ICD-10-CM

## 2023-02-15 DIAGNOSIS — Z886 Allergy status to analgesic agent status: Secondary | ICD-10-CM

## 2023-02-15 LAB — DIFFERENTIAL
Abs Immature Granulocytes: 0.04 10*3/uL (ref 0.00–0.07)
Basophils Absolute: 0.1 10*3/uL (ref 0.0–0.1)
Basophils Relative: 1 %
Eosinophils Absolute: 0 10*3/uL (ref 0.0–0.5)
Eosinophils Relative: 0 %
Immature Granulocytes: 0 %
Lymphocytes Relative: 7 %
Lymphs Abs: 0.6 10*3/uL — ABNORMAL LOW (ref 0.7–4.0)
Monocytes Absolute: 0.7 10*3/uL (ref 0.1–1.0)
Monocytes Relative: 8 %
Neutro Abs: 7.9 10*3/uL — ABNORMAL HIGH (ref 1.7–7.7)
Neutrophils Relative %: 84 %

## 2023-02-15 LAB — I-STAT CHEM 8, ED
BUN: 129 mg/dL — ABNORMAL HIGH (ref 8–23)
Calcium, Ion: 1 mmol/L — ABNORMAL LOW (ref 1.15–1.40)
Chloride: 100 mmol/L (ref 98–111)
Creatinine, Ser: 5.1 mg/dL — ABNORMAL HIGH (ref 0.44–1.00)
Glucose, Bld: 159 mg/dL — ABNORMAL HIGH (ref 70–99)
HCT: 44 % (ref 36.0–46.0)
Hemoglobin: 15 g/dL (ref 12.0–15.0)
Potassium: 6.8 mmol/L (ref 3.5–5.1)
Sodium: 127 mmol/L — ABNORMAL LOW (ref 135–145)
TCO2: 20 mmol/L — ABNORMAL LOW (ref 22–32)

## 2023-02-15 LAB — CBC
HCT: 39.6 % (ref 36.0–46.0)
Hemoglobin: 12.8 g/dL (ref 12.0–15.0)
MCH: 32.6 pg (ref 26.0–34.0)
MCHC: 32.3 g/dL (ref 30.0–36.0)
MCV: 100.8 fL — ABNORMAL HIGH (ref 80.0–100.0)
Platelets: 329 10*3/uL (ref 150–400)
RBC: 3.93 MIL/uL (ref 3.87–5.11)
RDW: 17.1 % — ABNORMAL HIGH (ref 11.5–15.5)
WBC: 9.4 10*3/uL (ref 4.0–10.5)
nRBC: 0.6 % — ABNORMAL HIGH (ref 0.0–0.2)

## 2023-02-15 LAB — URINALYSIS, ROUTINE W REFLEX MICROSCOPIC
Bilirubin Urine: NEGATIVE
Glucose, UA: 50 mg/dL — AB
Hgb urine dipstick: NEGATIVE
Ketones, ur: 5 mg/dL — AB
Nitrite: NEGATIVE
Protein, ur: >=300 mg/dL — AB
Specific Gravity, Urine: 1.01 (ref 1.005–1.030)
pH: 5 (ref 5.0–8.0)

## 2023-02-15 LAB — COMPREHENSIVE METABOLIC PANEL
ALT: 17 U/L (ref 0–44)
AST: 24 U/L (ref 15–41)
Albumin: 3.6 g/dL (ref 3.5–5.0)
Alkaline Phosphatase: 81 U/L (ref 38–126)
Anion gap: 18 — ABNORMAL HIGH (ref 5–15)
BUN: 90 mg/dL — ABNORMAL HIGH (ref 8–23)
CO2: 19 mmol/L — ABNORMAL LOW (ref 22–32)
Calcium: 9.8 mg/dL (ref 8.9–10.3)
Chloride: 94 mmol/L — ABNORMAL LOW (ref 98–111)
Creatinine, Ser: 4.79 mg/dL — ABNORMAL HIGH (ref 0.44–1.00)
GFR, Estimated: 8 mL/min — ABNORMAL LOW (ref >60)
Glucose, Bld: 163 mg/dL — ABNORMAL HIGH (ref 70–99)
Potassium: 3.9 mmol/L (ref 3.5–5.1)
Sodium: 131 mmol/L — ABNORMAL LOW (ref 135–145)
Total Bilirubin: 0.9 mg/dL (ref 0.3–1.2)
Total Protein: 7 g/dL (ref 6.5–8.1)

## 2023-02-15 LAB — MRSA NEXT GEN BY PCR, NASAL: MRSA by PCR Next Gen: NOT DETECTED

## 2023-02-15 LAB — RAPID URINE DRUG SCREEN, HOSP PERFORMED
Amphetamines: NOT DETECTED
Barbiturates: NOT DETECTED
Benzodiazepines: NOT DETECTED
Cocaine: NOT DETECTED
Opiates: NOT DETECTED
Tetrahydrocannabinol: NOT DETECTED

## 2023-02-15 LAB — GLUCOSE, CAPILLARY
Glucose-Capillary: 133 mg/dL — ABNORMAL HIGH (ref 70–99)
Glucose-Capillary: 110 mg/dL — ABNORMAL HIGH (ref 70–99)
Glucose-Capillary: 98 mg/dL (ref 70–99)
Glucose-Capillary: 100 mg/dL — ABNORMAL HIGH (ref 70–99)

## 2023-02-15 LAB — TROPONIN I (HIGH SENSITIVITY)
Troponin I (High Sensitivity): 34 ng/L — ABNORMAL HIGH (ref <18)
Troponin I (High Sensitivity): 51 ng/L — ABNORMAL HIGH (ref <18)

## 2023-02-15 LAB — TSH: TSH: 2.829 u[IU]/mL (ref 0.350–4.500)

## 2023-02-15 LAB — PROTIME-INR
INR: 1 (ref 0.8–1.2)
Prothrombin Time: 12.9 s (ref 11.4–15.2)

## 2023-02-15 LAB — APTT: aPTT: 27 s (ref 24–36)

## 2023-02-15 LAB — ETHANOL: Alcohol, Ethyl (B): <10 mg/dL (ref <10)

## 2023-02-15 LAB — BRAIN NATRIURETIC PEPTIDE: B Natriuretic Peptide: 211 pg/mL — ABNORMAL HIGH (ref 0.0–100.0)

## 2023-02-15 MED ORDER — AMLODIPINE BESYLATE 5 MG PO TABS
5.0000 mg | ORAL_TABLET | Freq: Every day | ORAL | Status: DC
  Administered 2023-02-16: 5 mg via ORAL
  Filled 2023-02-15: qty 1

## 2023-02-15 MED ORDER — SODIUM BICARBONATE 8.4 % IV SOLN
50.0000 meq | Freq: Once | INTRAVENOUS | Status: AC
  Administered 2023-02-15: 50 meq via INTRAVENOUS
  Filled 2023-02-15: qty 50

## 2023-02-15 MED ORDER — FEXOFENADINE HCL 180 MG PO TABS: 180.0000 mg | ORAL_TABLET | Freq: Every day | ORAL | Status: DC

## 2023-02-15 MED ORDER — ADULT MULTIVITAMIN W/MINERALS CH
1.0000 | ORAL_TABLET | Freq: Every day | ORAL | Status: DC
  Administered 2023-02-16 – 2023-02-26 (×11): 1 via ORAL
  Filled 2023-02-15 (×11): qty 1

## 2023-02-15 MED ORDER — ORAL CARE MOUTH RINSE: 15.0000 mL | OROMUCOSAL | Status: DC | PRN

## 2023-02-15 MED ORDER — CLONIDINE HCL 0.2 MG/24HR TD PTWK
0.2000 mg | MEDICATED_PATCH | TRANSDERMAL | Status: DC
  Administered 2023-02-15: 0.2 mg via TRANSDERMAL
  Filled 2023-02-15: qty 1

## 2023-02-15 MED ORDER — VITAMIN B-12 1000 MCG PO TABS
2500.0000 ug | ORAL_TABLET | Freq: Every day | ORAL | Status: DC
  Administered 2023-02-16 – 2023-02-26 (×11): 2500 ug via ORAL
  Filled 2023-02-15 (×11): qty 3

## 2023-02-15 MED ORDER — POLYETHYLENE GLYCOL 3350 17 G PO PACK
17.0000 g | PACK | Freq: Every day | ORAL | Status: DC | PRN
  Administered 2023-02-22: 17 g via ORAL
  Filled 2023-02-15: qty 1

## 2023-02-15 MED ORDER — PANTOPRAZOLE SODIUM 40 MG PO TBEC
40.0000 mg | DELAYED_RELEASE_TABLET | Freq: Every day | ORAL | Status: DC
  Administered 2023-02-16 – 2023-02-26 (×11): 40 mg via ORAL
  Filled 2023-02-15 (×11): qty 1

## 2023-02-15 MED ORDER — CARVEDILOL 3.125 MG PO TABS
6.2500 mg | ORAL_TABLET | Freq: Two times a day (BID) | ORAL | Status: DC
  Administered 2023-02-15 – 2023-02-16 (×2): 6.25 mg via ORAL
  Filled 2023-02-15 (×2): qty 2

## 2023-02-15 MED ORDER — TORSEMIDE 20 MG PO TABS: 20.0000 mg | ORAL_TABLET | ORAL | Status: DC

## 2023-02-15 MED ORDER — SERTRALINE HCL 50 MG PO TABS
25.0000 mg | ORAL_TABLET | Freq: Every day | ORAL | Status: DC
  Administered 2023-02-16 – 2023-02-26 (×11): 25 mg via ORAL
  Filled 2023-02-15 (×11): qty 1

## 2023-02-15 MED ORDER — CLOPIDOGREL BISULFATE 75 MG PO TABS
75.0000 mg | ORAL_TABLET | ORAL | Status: DC
  Administered 2023-02-16 – 2023-02-26 (×6): 75 mg via ORAL
  Filled 2023-02-15 (×8): qty 1

## 2023-02-15 MED ORDER — BENZONATATE 100 MG PO CAPS: 100.0000 mg | ORAL_CAPSULE | Freq: Three times a day (TID) | ORAL | Status: DC | PRN

## 2023-02-15 MED ORDER — ORAL CARE MOUTH RINSE: 15.0000 mL | OROMUCOSAL | Status: DC

## 2023-02-15 MED ORDER — CLEVIDIPINE BUTYRATE 0.5 MG/ML IV EMUL
0.0000 mg/h | INTRAVENOUS | Status: DC
  Administered 2023-02-15: 10 mg/h via INTRAVENOUS
  Administered 2023-02-15: 2 mg/h via INTRAVENOUS
  Administered 2023-02-15: 4 mg/h via INTRAVENOUS
  Administered 2023-02-16: 10 mg/h via INTRAVENOUS
  Filled 2023-02-15 (×4): qty 100

## 2023-02-15 MED ORDER — VITAMIN D 25 MCG (1000 UNIT) PO TABS
2000.0000 [IU] | ORAL_TABLET | Freq: Every day | ORAL | Status: DC
  Administered 2023-02-16 – 2023-02-26 (×11): 2000 [IU] via ORAL
  Filled 2023-02-15 (×11): qty 2

## 2023-02-15 MED ORDER — ALBUTEROL SULFATE (2.5 MG/3ML) 0.083% IN NEBU: 2.5000 mg | INHALATION_SOLUTION | RESPIRATORY_TRACT | Status: DC | PRN

## 2023-02-15 MED ORDER — CHLORHEXIDINE GLUCONATE CLOTH 2 % EX PADS
6.0000 | MEDICATED_PAD | Freq: Every day | CUTANEOUS | Status: DC
  Administered 2023-02-15 – 2023-02-26 (×8): 6 via TOPICAL

## 2023-02-15 MED ORDER — LEFLUNOMIDE 10 MG PO TABS
20.0000 mg | ORAL_TABLET | Freq: Every day | ORAL | Status: DC
  Administered 2023-02-16 – 2023-02-26 (×11): 20 mg via ORAL
  Filled 2023-02-15 (×11): qty 2

## 2023-02-15 MED ORDER — LABETALOL HCL 5 MG/ML IV SOLN
10.0000 mg | Freq: Once | INTRAVENOUS | Status: AC
  Administered 2023-02-15: 10 mg via INTRAVENOUS
  Filled 2023-02-15: qty 4

## 2023-02-15 MED ORDER — ACETAMINOPHEN 325 MG PO TABS
650.0000 mg | ORAL_TABLET | Freq: Two times a day (BID) | ORAL | Status: DC | PRN
  Administered 2023-02-17 – 2023-02-20 (×3): 650 mg via ORAL
  Filled 2023-02-15 (×2): qty 2

## 2023-02-15 MED ORDER — CALCIUM GLUCONATE-NACL 1-0.675 GM/50ML-% IV SOLN
1.0000 g | Freq: Once | INTRAVENOUS | Status: AC
  Administered 2023-02-15: 1000 mg via INTRAVENOUS
  Filled 2023-02-15: qty 50

## 2023-02-15 MED ORDER — ORAL CARE MOUTH RINSE
15.0000 mL | OROMUCOSAL | Status: DC
  Administered 2023-02-15 – 2023-02-16 (×5): 15 mL via OROMUCOSAL

## 2023-02-15 MED ORDER — HEPARIN SODIUM (PORCINE) 5000 UNIT/ML IJ SOLN
5000.0000 [IU] | Freq: Three times a day (TID) | INTRAMUSCULAR | Status: DC
  Administered 2023-02-15 – 2023-02-26 (×34): 5000 [IU] via SUBCUTANEOUS
  Filled 2023-02-15 (×35): qty 1

## 2023-02-15 MED ORDER — LORATADINE 10 MG PO TABS
10.0000 mg | ORAL_TABLET | Freq: Every day | ORAL | Status: DC
  Administered 2023-02-16 – 2023-02-26 (×11): 10 mg via ORAL
  Filled 2023-02-15 (×11): qty 1

## 2023-02-15 MED ORDER — VALACYCLOVIR HCL 500 MG PO TABS: 1000.0000 mg | ORAL_TABLET | ORAL | Status: DC

## 2023-02-15 MED ORDER — DOCUSATE SODIUM 100 MG PO CAPS: 100.0000 mg | ORAL_CAPSULE | Freq: Two times a day (BID) | ORAL | Status: DC | PRN

## 2023-02-15 MED ORDER — TRIAMCINOLONE ACETONIDE 55 MCG/ACT NA AERO
1.0000 | INHALATION_SPRAY | Freq: Every day | NASAL | Status: DC
  Administered 2023-02-16 – 2023-02-26 (×11): 1 via NASAL
  Filled 2023-02-15 (×2): qty 10.8

## 2023-02-15 MED ORDER — VITAMIN C 500 MG PO TABS
500.0000 mg | ORAL_TABLET | Freq: Every day | ORAL | Status: DC
  Administered 2023-02-16 – 2023-02-26 (×11): 500 mg via ORAL
  Filled 2023-02-15 (×12): qty 1

## 2023-02-15 MED ORDER — LEVOTHYROXINE SODIUM 88 MCG PO TABS
88.0000 ug | ORAL_TABLET | Freq: Every day | ORAL | Status: DC
  Administered 2023-02-16 – 2023-02-26 (×11): 88 ug via ORAL
  Filled 2023-02-15 (×12): qty 1

## 2023-02-15 MED ORDER — ATORVASTATIN CALCIUM 80 MG PO TABS
80.0000 mg | ORAL_TABLET | Freq: Every day | ORAL | Status: DC
  Administered 2023-02-16 – 2023-02-26 (×11): 80 mg via ORAL
  Filled 2023-02-15 (×3): qty 1
  Filled 2023-02-15 (×2): qty 2
  Filled 2023-02-15 (×6): qty 1

## 2023-02-15 NOTE — ED Notes (Signed)
Intensivist  MD steven Minor  [phone] not calling MD Messick Back

## 2023-02-15 NOTE — H&P (Signed)
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 Per EDP, patient last known well around 10PM, and husband woke up at 0430 to find patient in chair confused. EMS was called and patient noted to be hypertensive. On arrival to ED SBP 244. EDP noted subtle left facial droop, LUE weakness. CT head without acute bleed. She was given IV labetalol and placed on Cleviprex gtt. Labs notable for Na 131, BUN 90, Cr 4.79, AGMA with AG 18, bicarb 19. BNP 211, trop 34 (delta pending), TSH normal, UA without UTI, UDS negative. Neurology was consulted and recommended MRI which ordered. PCCM was consulted for admission on Cleviprex gtt.  On my exam, patient is confused. Oriented to self. Left sided facial droop present. Husband is at bedside. He endorses the above story. He does note she fell 2-3 days ago, striking her head. They were not seen for this. States she was a bit confused last night on the phone with her sister, but did not think much of it until this morning. States she is currently being treated for shingles but no other changes to her medications or dosing. Takes her medications as prescribed.   Pertinent  Medical History  CAD s/p CABG x1, DVT/PE, COPD, CKD IV, RA, HTN, HLD  Significant Hospital Events: Including procedures, antibiotic start and stop dates in addition to other pertinent events   10/17 admitted to PCCM   Interim History / Subjective:  Patient denies chest pain, headache, visual changes or other complaints.   Objective   Blood pressure (!) 182/69, pulse 86, temperature (!) 97.5 F  (36.4 C), resp. rate 17, SpO2 91%.       No intake or output data in the 24 hours ending 02/15/23 1004 There were no vitals filed for this visit.  Examination: General: elderly female laying in bed, somewhat disheveled, confused HENT: mucous membranes moist, PERRLA, EOMI Lungs: clear bilaterally  Cardiovascular: s1/s2 without murmur, rub, gallop Abdomen: soft, non-tender Extremities: no pitting edema Neuro: Left facial droop, mild dysarthria. Oriented to self only. Follows commands. No appreciative focal extremity weakness  GU: foley   Resolved Hospital Problem list     Assessment & Plan:  Hypertensive crisis  Left-sided facial droop Altered mental status  Presented with SBP 250. Given x1 labetalol IV and placed on Cleviprex. On low dose cleviprex with systolics hovering around 180-190. CT head negative for bleed. MRI pending. Suspect facial droop and confusion are from her hypertensive crisis. If this does not improve with normalization of BP will broaden work up (recent zoster dx, TIA, etc.) - con't cleviprex goal SBP < 185, DBP < 110  - start home oral antihypertensives amlodipine, carvedilol, clonidine to help wean off gtt  - MRI pending  - trend trop to peak 34>51, EKG without ST-T wave changes.  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  - trend BMP  - hold home torsemide for now   Hyperlipidemia  CAD s/p CABG x1  - con't home atorvastatin 80mg  daily   Rheumatoid Arthritis  - con't home leflunomide 2-mg daily   Hypothyroidism  - con't home synthroid daily   Hx PE/DVT  - continue home Plavix  - was on Eliquis but d/c due to recurrent GIB.    Goals of care: discussed with husband and patient at bedside. They are currently working on advanced directives at home but agree to DNR/DNI at this time which is in alignment with her wishes.   Best Practice (right click and "Reselect all SmartList Selections" daily)   Diet/type: Regular consistency (see orders) DVT prophylaxis: DOAC GI prophylaxis: PPI Lines: N/A Foley:  Yes, and it is still needed Code Status:  limited Last date of multidisciplinary goals of care discussion [10/17 spoke with patient and husband at bedside. He states they are working on advanced directives. Notes she would not want CPR or intubation]  Labs   CBC: Recent Labs  Lab 02/13/23 1240 02/15/23 0551 02/15/23 0635  WBC  --  9.4  --   NEUTROABS  --  7.9*  --   HGB 11.9* 12.8 15.0  HCT  --  39.6 44.0  MCV  --  100.8*  --   PLT  --  329  --     Basic Metabolic Panel: Recent Labs  Lab 02/15/23 0635 02/15/23 0645  NA 127* 131*  K 6.8* 3.9  CL 100 94*  CO2  --  19*  GLUCOSE 159* 163*  BUN 129* 90*  CREATININE 5.10* 4.79*  CALCIUM  --  9.8   GFR: Estimated Creatinine Clearance: 7.2 mL/min (A) (by C-G formula based on SCr of 4.79 mg/dL (H)). Recent Labs  Lab 02/15/23 0551  WBC 9.4    Liver Function Tests: Recent Labs  Lab 02/15/23 0645  AST 24  ALT 17  ALKPHOS 81  BILITOT 0.9  PROT 7.0  ALBUMIN 3.6   No results for input(s): "LIPASE", "AMYLASE" in the last 168 hours. No results for input(s): "AMMONIA" in the last 168 hours.  ABG    Component Value Date/Time   PHART 7.340 (L) 03/03/2015 0355   PCO2ART 40.9 03/03/2015 0355   PO2ART 70.9 (L) 03/03/2015 0355   HCO3 21.5 03/03/2015 0355   TCO2 20 (L) 02/15/2023 0635   ACIDBASEDEF 3.4 (H) 03/03/2015 0355   O2SAT 92.6 03/03/2015 0355     Coagulation Profile: Recent Labs  Lab 02/15/23 0645  INR 1.0    Cardiac Enzymes: No results for input(s): "CKTOTAL", "CKMB", "CKMBINDEX", "TROPONINI" in the last 168  hours.  HbA1C: No results found for: "HGBA1C"  CBG: No results for input(s): "GLUCAP" in the last 168 hours.  Review of Systems:   Denies complaints  Past Medical History:  She,  has a past medical history of Abdominal aortic aneurysm (HCC), Acquired asplenia, Acute bronchitis (04/03/2016), Acute respiratory failure with hypoxia (HCC) (04/15/2016), Adenomatous colon polyp, Anemia, BRBPR (bright red blood per rectum) (11/17/2021), CAD in native artery (1996, 2002, 2005 ), Cervical disc disease, Chronic kidney disease, COPD (chronic obstructive pulmonary disease) (HCC), Diverticulosis, DVT (deep venous thrombosis) (HCC), History of DVT (deep vein thrombosis) (03/06/2022), History of pulmonary embolus (PE), Hyperlipidemia, Hypertension, Hypothyroidism (acquired), Myocardial infarction (HCC) (11/2014), Polymyalgia rheumatica (HCC), Pulmonary emboli (HCC) (02/06/2022), Rheumatoid arthritis (HCC) (2011), S/P CABG x 1 (1996), Shortness of breath dyspnea, Stroke (  HCC) (11/2014), and Urinary frequency.   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 Atretic LIMA. Minimal disease the RCA and Circumflex system.   CARPAL TUNNEL RELEASE Left    CATARACT EXTRACTION Bilateral    CERVICAL SPINE SURGERY     plate 1610 Dr. Wynetta Emery   COLONOSCOPY     COLONOSCOPY WITH PROPOFOL N/A 11/28/2022   Procedure: COLONOSCOPY WITH PROPOFOL;   Surgeon: [name], MD;  Location: [facility] ENDOSCOPY;  Service: Gastroenterology;  Laterality: N/A;   CORONARY ARTERY BYPASS GRAFT  1996   INCLUDED AN INTERNAL MAMMARY ARTERY TO THE LAD. EF WAS NORMAL   ESOPHAGOGASTRODUODENOSCOPY (EGD) WITH PROPOFOL N/A 07/08/2022   Procedure: ESOPHAGOGASTRODUODENOSCOPY (EGD) WITH PROPOFOL;  Surgeon: Benancio Deeds, MD;  Location: [facility] ENDOSCOPY;  Service: Gastroenterology;  Laterality: N/A;   ESOPHAGOGASTRODUODENOSCOPY (EGD) WITH PROPOFOL N/A 11/28/2022   Procedure: ESOPHAGOGASTRODUODENOSCOPY (EGD) WITH PROPOFOL;  Surgeon: [name], MD;  Location: [facility] ENDOSCOPY;  Service: Gastroenterology;  Laterality: N/A;   INGUINAL HERNIA REPAIR Right    LAPAROSCOPIC APPENDECTOMY N/A 06/28/2016   Procedure: APPENDECTOMY LAPAROSCOPIC;  Surgeon: [name], MD;  Location: [facility] ORS;  Service: General;  Laterality: N/A;   NM MYOVIEW LTD  06/2010   Fixed anteroseptal, apical and inferoapical defect with moderate size. Most likely scar. Mild subendocardial ischemia. EF 71% LOW RISK.    POLYPECTOMY  11/28/2022   Procedure: POLYPECTOMY;  Surgeon: [name], MD;  Location: [facility] ENDOSCOPY;  Service: Gastroenterology;;   SPLENECTOMY     TOTAL HIP ARTHROPLASTY Left 01/23/2022   Procedure: LEFT TOTAL HIP ARTHROPLASTY ANTERIOR APPROACH;  Surgeon: [name], MD;  Location: [facility] OR;  Service: Orthopedics;  Laterality: Left;  3-C   TRANSTHORACIC ECHOCARDIOGRAM  12/2014   [facility]: Normal LV size & function. EF 55-60%,    vagina polyp     VIDEO ASSISTED THORACOSCOPY (VATS)/WEDGE RESECTION Left 03/02/2015   Procedure: VIDEO ASSISTED THORACOSCOPY (VATS), MINI THORACOTOMY, LEFT UPPER LOBE WEDGE, TAKE DOWN OF INTERNAL MAMMARY LESIONS, PLACEMENT OF ON-Q PUMP;  Surgeon: [name], MD;  Location: [facility] OR;  Service: Thoracic;  Laterality: Left;   VIDEO BRONCHOSCOPY N/A 03/02/2015   Procedure: BRONCHOSCOPY;  Surgeon: [name], MD;  Location: [facility] OR;  Service:  Thoracic;  Laterality: N/A;   VIDEO BRONCHOSCOPY WITH ENDOBRONCHIAL NAVIGATION N/A 10/08/2017   Procedure: VIDEO BRONCHOSCOPY WITH ENDOBRONCHIAL NAVIGATION WITH BIOPSIES OF LEFT UPPER LOBE AND LEFT LOWER LOBE;  Surgeon: [name], MD;  Location: [facility] OR;  Service: Thoracic;  Laterality: N/A;     Social History:   reports that she quit smoking about 64 years ago. Her smoking use included cigarettes. She has never been exposed to tobacco smoke. She has never used smokeless tobacco. She reports current alcohol use. She reports that she does not use drugs.   Family History:  Her family history includes Cancer in her paternal uncle;  Coronary artery disease in an other family member; Diabetes in her mother; Heart disease in her mother; Hyperlipidemia in her mother, sister, son, son, and son; Hypertension in her daughter, mother, sister, son, son, son, and another family member; Stroke in her father. There is no history of Asthma or Colon cancer.   Allergies Allergies  Allergen Reactions   Nsaids Other (See Comments)    Stomach upset Told to avoid due to Eliquis   Aspirin Other (See Comments)    Stomach upset Told to avoid due to Eliquis   Codeine Nausea And Vomiting   Nitrostat [Nitroglycerin] Other (See Comments)    Bradycardia. Drop in heart rate    Tramadol     Confusion     Home Medications  Prior to Admission medications   Medication Sig Start Date End Date Taking? Authorizing Provider  acetaminophen (TYLENOL) 325 MG tablet Take 2 tablets (650 mg total) by mouth 2 (two) times daily as needed (generalized pain). 08/24/22   Mast, Man X, NP  albuterol (VENTOLIN HFA) 108 (90 Base) MCG/ACT inhaler Inhale 1-2 puffs into the lungs every 4 (four) hours as needed for wheezing or shortness of breath. 03/27/22   Mast, Man X, NP  amLODipine (NORVASC) 5 MG tablet Take 1 tablet (5 mg total) by mouth daily. 01/31/23 05/01/23  [name], MD  ascorbic acid (VITAMIN C) 500 MG tablet Take 1  tablet (500 mg total) by mouth daily. 03/27/22   Mast, Man X, NP  atorvastatin (LIPITOR) 80 MG tablet Take 1 tablet (80 mg total) by mouth daily. 02/08/23   Plotnikov, Georgina Quint, MD  benzonatate (TESSALON) 100 MG capsule Take 1 capsule (100 mg total) by mouth 3 (three) times daily as needed for cough. 12/25/22   McElwee, Jake Church, NP  carvedilol (COREG) 6.25 MG tablet Take 1 tablet (6.25 mg total) by mouth 2 (two) times daily with a meal. 01/11/23   Plotnikov, Georgina Quint, MD  Cholecalciferol (VITAMIN D3) 50 MCG (2000 UT) capsule Take 1 capsule (2,000 Units total) by mouth daily. 03/27/22   Mast, Man X, NP  cloNIDine (CATAPRES - DOSED IN MG/24 HR) 0.2 mg/24hr patch Place 1 patch (0.2 mg total) onto the skin once a week. 01/20/23   Plotnikov, Georgina Quint, MD  cloNIDine (CATAPRES) 0.2 MG tablet Take 1 tablet (0.2 mg total) by mouth 4 (four) times daily as needed for up to 30 doses. For systolic blood pressure (top number) over 200 mmhg 10/19/22   Plotnikov, Georgina Quint, MD  clopidogrel (PLAVIX) 75 MG tablet Take 1 tablet (75 mg total) by mouth every other day. Patient not taking: Reported on 02/13/2023 01/31/23   [name], MD  Cyanocobalamin 2500 MCG TABS Take 2,500 mcg by mouth daily. 08/24/22   Mast, Man X, NP  Fexofenadine HCl (ALLERGY 24-HR PO) Take 1 tablet by mouth in the morning and at bedtime.    [provider]  leflunomide (ARAVA) 20 MG tablet Take 1 tablet (20 mg total) by mouth daily. 08/24/22   Mast, Man X, NP  levothyroxine (SYNTHROID) 88 MCG tablet Take 1 tablet (88 mcg total) by mouth daily before breakfast. 08/24/22   Mast, Man X, NP  methylPREDNISolone (MEDROL DOSEPAK) 4 MG TBPK tablet As directed 02/08/23   Plotnikov, Georgina Quint, MD  Multiple Vitamins-Minerals (CENTRUM ADULT PO) Take 1 tablet by mouth daily. 1 Tablet Daily.    [provider]  pantoprazole (PROTONIX) 40 MG tablet Take 1 tablet (40 mg total) by mouth daily.  08/24/22   Mast, Man X, NP  sertraline (ZOLOFT) 50  MG tablet Take 0.5 tablets (25 mg total) by mouth daily. 12/10/22   Lurene Shadow, MD  torsemide (DEMADEX) 20 MG tablet Take 1 tablet (20 mg total) by mouth See admin instructions. Take 20 mg (1 tablet) by mouth twice a week on Tuesday's and Saturday's. Patient taking differently: Take 20 mg by mouth See admin instructions. Take 20 mg (1 tablet) daily. 02/08/23   Plotnikov, Georgina Quint, MD  triamcinolone (NASACORT) 55 MCG/ACT AERO nasal inhaler Place 1 spray into the nose daily. 08/24/22   Mast, Man X, NP  valACYclovir (VALTREX) 1000 MG tablet Take 1 tablet (1,000 mg total) by mouth 3 (three) times daily. 02/08/23   Plotnikov, Georgina Quint, MD     Critical care time: 40    [facility] 02/15/2023, 10:32 AM  Please see [email or web address] for pager details.  From 7A-7P if no response, please call [phone]. After hours, please call ELink [phone].

## 2023-02-15 NOTE — ED Provider Notes (Signed)
[place] EMERGENCY DEPARTMENT AT [facility] Provider Note   CSN: [id number] Arrival date & time: 02/15/23  0547     History  Chief Complaint  Patient presents with   Altered Mental Status    [name] is a 82 y.o. female.  Patient brought to the emergency department for altered mental status.  Patient's husband told EMS that he last saw her at 10 PM when he went to bed.  She was at her baseline sitting in a computer chair.  He reports that he woke up at 430 this morning and found her still sitting in the chair, confused.  EMS report that the patient is markedly hypertensive.  She is able to answer yes and no questions but is lethargic.  Husband reports that she had a fall 2 days ago.       Home Medications Prior to Admission medications   Medication Sig Start Date End Date Taking? Authorizing Provider  acetaminophen (TYLENOL) 325 MG tablet Take 2 tablets (650 mg total) by mouth 2 (two) times daily as needed (generalized pain). 08/24/22   Mast, Man X, NP  albuterol (VENTOLIN HFA) 108 (90 Base) MCG/ACT inhaler Inhale 1-2 puffs into the lungs every 4 (four) hours as needed for wheezing or shortness of breath. 03/27/22   Mast, Man X, NP  amLODipine (NORVASC) 5 MG tablet Take 1 tablet (5 mg total) by mouth daily. 01/31/23 05/01/23  [name], MD  ascorbic acid (VITAMIN C) 500 MG tablet Take 1 tablet (500 mg total) by mouth daily. 03/27/22   Mast, Man X, NP  atorvastatin (LIPITOR) 80 MG tablet Take 1 tablet (80 mg total) by mouth daily. 02/08/23   Plotnikov, Georgina Quint, MD  benzonatate (TESSALON) 100 MG capsule Take 1 capsule (100 mg total) by mouth 3 (three) times daily as needed for cough. 12/25/22   McElwee, Jake Church, NP  carvedilol (COREG) 6.25 MG tablet Take 1 tablet (6.25 mg total) by mouth 2 (two) times daily with a meal. 01/11/23   Plotnikov, Georgina Quint, MD  Cholecalciferol (VITAMIN D3) 50 MCG (2000 UT) capsule Take 1 capsule (2,000 Units total) by mouth daily.  03/27/22   Mast, Man X, NP  cloNIDine (CATAPRES - DOSED IN MG/24 HR) 0.2 mg/24hr patch Place 1 patch (0.2 mg total) onto the skin once a week. 01/20/23   Plotnikov, Georgina Quint, MD  cloNIDine (CATAPRES) 0.2 MG tablet Take 1 tablet (0.2 mg total) by mouth 4 (four) times daily as needed for up to 30 doses. For systolic blood pressure (top number) over 200 mmhg 10/19/22   Plotnikov, Georgina Quint, MD  clopidogrel (PLAVIX) 75 MG tablet Take 1 tablet (75 mg total) by mouth every other day. Patient not taking: Reported on 02/13/2023 01/31/23   [name], MD  Cyanocobalamin 2500 MCG TABS Take 2,500 mcg by mouth daily. 08/24/22   Mast, Man X, NP  Fexofenadine HCl (ALLERGY 24-HR PO) Take 1 tablet by mouth in the morning and at bedtime.    [provider]  leflunomide (ARAVA) 20 MG tablet Take 1 tablet (20 mg total) by mouth daily. 08/24/22   Mast, Man X, NP  levothyroxine (SYNTHROID) 88 MCG tablet Take 1 tablet (88 mcg total) by mouth daily before breakfast. 08/24/22   Mast, Man X, NP  methylPREDNISolone (MEDROL DOSEPAK) 4 MG TBPK tablet As directed 02/08/23   Plotnikov, Georgina Quint, MD  Multiple Vitamins-Minerals (CENTRUM ADULT PO) Take 1 tablet by mouth daily. 1 Tablet Daily.  [provider]  pantoprazole (PROTONIX) 40 MG tablet Take 1 tablet (40 mg total) by mouth daily. 08/24/22   Mast, Man X, NP  sertraline (ZOLOFT) 50 MG tablet Take 0.5 tablets (25 mg total) by mouth daily. 12/10/22   Lurene Shadow, MD  torsemide (DEMADEX) 20 MG tablet Take 1 tablet (20 mg total) by mouth See admin instructions. Take 20 mg (1 tablet) by mouth twice a week on Tuesday's and Saturday's. Patient taking differently: Take 20 mg by mouth See admin instructions. Take 20 mg (1 tablet) daily. 02/08/23   Plotnikov, Georgina Quint, MD  triamcinolone (NASACORT) 55 MCG/ACT AERO nasal inhaler Place 1 spray into the nose daily. 08/24/22   Mast, Man X, NP  valACYclovir (VALTREX) 1000 MG tablet Take 1 tablet (1,000 mg total) by  mouth 3 (three) times daily. 02/08/23   Plotnikov, Georgina Quint, MD      Allergies    Nsaids, Aspirin, Codeine, Nitrostat [nitroglycerin], and Tramadol    Review of Systems   Review of Systems  Physical Exam Updated Vital Signs BP (!) 244/89   Pulse 79   Temp (!) 94.8 F (34.9 C) (Rectal)   Resp (!) 21   SpO2 92%  Physical Exam HENT:     Head: Atraumatic.  Eyes:     Pupils: Pupils are equal, round, and reactive to light.  Cardiovascular:     Rate and Rhythm: Normal rate and regular rhythm.  Pulmonary:     Effort: Pulmonary effort is normal.     Breath sounds: Normal breath sounds.  Abdominal:     Palpations: Abdomen is soft.     Tenderness: There is no abdominal tenderness.  Musculoskeletal:        General: No deformity.     Cervical back: Neck supple.     Right lower leg: Edema present.     Left lower leg: Edema present.  Skin:    General: Skin is cool.  Neurological:     Mental Status: She is lethargic.     GCS: GCS eye subscore is 3. GCS verbal subscore is 4. GCS motor subscore is 6.     Cranial Nerves: Facial asymmetry present.     Motor: Weakness (Left upper extremity) present.     ED Results / Procedures / Treatments   Labs (all labs ordered are listed, but only abnormal results are displayed) Labs Reviewed  CBC - Abnormal; Notable for the following components:      Result Value   MCV 100.8 (*)    RDW 17.1 (*)    nRBC 0.6 (*)    All other components within normal limits  DIFFERENTIAL - Abnormal; Notable for the following components:   Neutro Abs 7.9 (*)    Lymphs Abs 0.6 (*)    All other components within normal limits  I-STAT CHEM 8, ED - Abnormal; Notable for the following components:   Sodium 127 (*)    Potassium 6.8 (*)    BUN 129 (*)    Creatinine, Ser 5.10 (*)    Glucose, Bld 159 (*)    Calcium, Ion 1.00 (*)    TCO2 20 (*)    All other components within normal limits  ETHANOL  RAPID URINE DRUG SCREEN, HOSP PERFORMED  URINALYSIS, ROUTINE W  REFLEX MICROSCOPIC  BRAIN NATRIURETIC PEPTIDE  COMPREHENSIVE METABOLIC PANEL  PROTIME-INR  APTT  TSH  TROPONIN I (HIGH SENSITIVITY)    EKG EKG Interpretation Date/Time:  Thursday February 15 2023 05:58:44 EDT Ventricular Rate:  69  PR Interval:  215 QRS Duration:  93 QT Interval:  423 QTC Calculation: 479 R Axis:   79  Text Interpretation: Sinus rhythm Borderline prolonged PR interval Left ventricular hypertrophy No acute changes Confirmed by Gilda Crease [id number]) on 02/15/2023 6:18:41 AM  Radiology CT HEAD WO CONTRAST  Result Date: 02/15/2023 CLINICAL DATA:  Lethargy.  Left-sided facial droop and weakness. EXAM: CT HEAD WITHOUT CONTRAST CT CERVICAL SPINE WITHOUT CONTRAST TECHNIQUE: Multidetector CT imaging of the head and cervical spine was performed following the standard protocol without intravenous contrast. Multiplanar CT image reconstructions of the cervical spine were also generated. RADIATION DOSE REDUCTION: This exam was performed according to the departmental dose-optimization program which includes automated exposure control, adjustment of the mA and/or kV according to patient size and/or use of iterative reconstruction technique. COMPARISON:  08/02/2022 FINDINGS: CT HEAD FINDINGS Brain: No evidence of acute infarction, hemorrhage, hydrocephalus, extra-axial collection or mass lesion/mass effect. Generalized brain atrophy in keeping with age Vascular: No hyperdense vessel or unexpected calcification. Skull: Normal. Negative for fracture or focal lesion. Sinuses/Orbits: No acute finding. CT CERVICAL SPINE FINDINGS Alignment: Normal. Skull base and vertebrae: No acute fracture. No primary bone lesion or focal pathologic process. C3-C5 ACDF with solid arthrodesis Soft tissues and spinal canal: No prevertebral fluid or swelling. No visible canal hematoma. Disc levels: Disc collapse and endplate spurring especially at C5-6 to C6-7. Facet osteoarthritis greatest on the right at  C2-3. Upper chest: No visible injury IMPRESSION: No acute finding in the head or cervical spine. No specific cause for symptoms. Electronically Signed   By: Tiburcio Pea M.D.   On: 02/15/2023 06:57   CT CERVICAL SPINE WO CONTRAST  Result Date: 02/15/2023 CLINICAL DATA:  Lethargy.  Left-sided facial droop and weakness. EXAM: CT HEAD WITHOUT CONTRAST CT CERVICAL SPINE WITHOUT CONTRAST TECHNIQUE: Multidetector CT imaging of the head and cervical spine was performed following the standard protocol without intravenous contrast. Multiplanar CT image reconstructions of the cervical spine were also generated. RADIATION DOSE REDUCTION: This exam was performed according to the departmental dose-optimization program which includes automated exposure control, adjustment of the mA and/or kV according to patient size and/or use of iterative reconstruction technique. COMPARISON:  08/02/2022 FINDINGS: CT HEAD FINDINGS Brain: No evidence of acute infarction, hemorrhage, hydrocephalus, extra-axial collection or mass lesion/mass effect. Generalized brain atrophy in keeping with age Vascular: No hyperdense vessel or unexpected calcification. Skull: Normal. Negative for fracture or focal lesion. Sinuses/Orbits: No acute finding. CT CERVICAL SPINE FINDINGS Alignment: Normal. Skull base and vertebrae: No acute fracture. No primary bone lesion or focal pathologic process. C3-C5 ACDF with solid arthrodesis Soft tissues and spinal canal: No prevertebral fluid or swelling. No visible canal hematoma. Disc levels: Disc collapse and endplate spurring especially at C5-6 to C6-7. 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 Upper chest: No visible injury IMPRESSION: No acute finding in the head or cervical spine. No specific cause for symptoms. Electronically Signed   By: Tiburcio Pea M.D.   On: 02/15/2023 06:57    Procedures Procedures    Medications Ordered in ED Medications  calcium gluconate 1 g/ 50 mL sodium chloride  IVPB (has no administration in time range)  sodium bicarbonate injection 50 mEq (has no administration in time range)  labetalol (NORMODYNE) injection 10 mg (10 mg Intravenous Given 02/15/23 8119)    ED Course/ Medical Decision Making/ A&P  Medical Decision Making Amount and/or Complexity of Data Reviewed Labs: ordered. Radiology: ordered.  Risk Prescription drug management.   Presents with altered mental status from home.  Last known normal was 10 PM last night.  Patient with subtle left facial droop, left upper extremity weakness but has difficulty with the exam.  Patient noted to be very hypertensive at arrival.  She was brought to radiology for CT head to rule out bleed.  No bleed is noted.  Blood pressure treated with IV labetalol.  Patient hypothermic, possibly secondary to exposure from sitting up all night.  Treated with Lawyer.  She is improving becoming more alert.  Patient with chronic renal failure.  She does appear to be somewhat uremic with an acute kidney injury which may be adding to her mental status change.  I-STAT Chem-8 shows hyperkalemia.  Initiate treatment while awaiting formal chemistry.  Discussed briefly with neurology.  Recomendation for MRI to further evaluate.  Patient noted to have some scabs on her lower chin.  Record review reveals that she was diagnosed with shingles 7 days ago.  At that visit, her pain and rash have been present for 7 days.  Patient with somewhat focal examination, no fever and fairly sudden onset of symptoms.  Doubt HSV encephalitis but would need to revisit if MRI is negative and she does not improve with treatment of blood pressure.  Patient will be signed out to oncoming ER physician to follow-up MRI and the remainder of her blood work.        Final Clinical Impression(s) / ED Diagnoses Final diagnoses:  Hypertensive emergency  AKI (acute kidney injury) (HCC)  Hypothermia, initial  encounter    Rx / DC Orders ED Discharge Orders     None         Kyden Potash, Canary Brim, MD 02/15/23 [phone]

## 2023-02-15 NOTE — Evaluation (Signed)
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 Most likely scar. Mild subendocardial ischemia. EF 71% LOW RISK.    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 Kellie Simmering   Pulmonary emboli [facility]) 02/06/2022   01/2022 PE/DVT post-op. Lower GI bleed     Treat edema  Start Eliquis at the reduced dose due to Creat>1.5 and age >65   Rheumatoid arthritis (HCC) 2011   Dr.Truslow; fracture knees, hands and wrists -    S/P CABG x 1 1996   CABG--LIMA-LAD for ostial LAD (not felt to be PCI amenable). EF NORMAL then; LIMA now atretic   Shortness of breath dyspnea     with exertion   Stroke (HCC) 11/2014   TIA    Urinary frequency    Past Surgical History:  Past Surgical History:  Procedure Laterality Date   ABDOMINAL AORTIC ANEURYSM REPAIR  1996   Complicated by mesenteric artery stenosis and splenic artery infarction with acquired Asplenia   APPENDECTOMY     BIOPSY  07/08/2022   Procedure: BIOPSY;  Surgeon: Benancio Deeds, MD;  Location: [facility] ENDOSCOPY;  Service: Gastroenterology;;   BIOPSY  11/28/2022   Procedure: BIOPSY;  Surgeon: [name], MD;  Location: [facility] ENDOSCOPY;  Service: Gastroenterology;;   [name]  07/2011   right foot   CARDIAC CATHETERIZATION  2005   (Most recent CATH) - ostial LAD lesion 20-30% (down from 90% initially). Atretic LIMA. Minimal disease the RCA and Circumflex system.   CARPAL TUNNEL RELEASE Left    CATARACT EXTRACTION Bilateral    CERVICAL SPINE SURGERY     plate 1027 Dr. Wynetta Emery   COLONOSCOPY     COLONOSCOPY WITH PROPOFOL N/A 11/28/2022   Procedure: COLONOSCOPY WITH PROPOFOL;  Surgeon: [name], MD;  Location: [facility] ENDOSCOPY;  Service: Gastroenterology;  Laterality: N/A;   CORONARY ARTERY BYPASS GRAFT  1996   INCLUDED AN INTERNAL MAMMARY ARTERY TO THE LAD. EF WAS NORMAL  ESOPHAGOGASTRODUODENOSCOPY (EGD) WITH PROPOFOL N/A 07/08/2022   Procedure: ESOPHAGOGASTRODUODENOSCOPY (EGD) WITH PROPOFOL;  Surgeon: Benancio Deeds, MD;  Location: [facility] ENDOSCOPY;  Service: Gastroenterology;  Laterality: N/A;   ESOPHAGOGASTRODUODENOSCOPY (EGD) WITH PROPOFOL N/A 11/28/2022   Procedure: ESOPHAGOGASTRODUODENOSCOPY (EGD) WITH PROPOFOL;  Surgeon: [name], MD;  Location: [facility] ENDOSCOPY;  Service: Gastroenterology;  Laterality: N/A;   INGUINAL HERNIA REPAIR Right    LAPAROSCOPIC APPENDECTOMY N/A 06/28/2016   Procedure: APPENDECTOMY LAPAROSCOPIC;  Surgeon: [name], MD;  Location: [facility] ORS;  Service: General;  Laterality: N/A;   NM MYOVIEW LTD  06/2010   Fixed anteroseptal, apical and inferoapical defect with  moderate size. Most likely scar. Mild subendocardial ischemia. EF 71% LOW RISK.    POLYPECTOMY  11/28/2022   Procedure: POLYPECTOMY;  Surgeon: [name], MD;  Location: [facility] ENDOSCOPY;  Service: Gastroenterology;;   SPLENECTOMY     TOTAL HIP ARTHROPLASTY Left 01/23/2022   Procedure: LEFT TOTAL HIP ARTHROPLASTY ANTERIOR APPROACH;  Surgeon: [name], MD;  Location: [facility] OR;  Service: Orthopedics;  Laterality: Left;  3-C   TRANSTHORACIC ECHOCARDIOGRAM  12/2014   [facility]: Normal LV size & function. EF 55-60%,    vagina polyp     VIDEO ASSISTED THORACOSCOPY (VATS)/WEDGE RESECTION Left 03/02/2015   Procedure: VIDEO ASSISTED THORACOSCOPY (VATS), MINI THORACOTOMY, LEFT UPPER LOBE WEDGE, TAKE DOWN OF INTERNAL MAMMARY LESIONS, PLACEMENT OF ON-Q PUMP;  Surgeon: [name], MD;  Location: [facility] OR;  Service: Thoracic;  Laterality: Left;   VIDEO BRONCHOSCOPY N/A 03/02/2015   Procedure: BRONCHOSCOPY;  Surgeon: [name], MD;  Location: [facility] OR;  Service: Thoracic;  Laterality: N/A;   VIDEO BRONCHOSCOPY WITH ENDOBRONCHIAL NAVIGATION N/A 10/08/2017   Procedure: VIDEO BRONCHOSCOPY WITH ENDOBRONCHIAL NAVIGATION WITH BIOPSIES OF LEFT UPPER LOBE AND LEFT LOWER LOBE;  Surgeon: [name], MD;  Location: [facility] OR;  Service: Thoracic;  Laterality: N/A;   HPI:  [name] is an 82 yo female presenting to ED 10/17 with AMS and L facial droop. CTH and MRI Brain negative. EEG suggestive of mild to moderate encephalopathy. PMH includes CAD s/p CABG, COPD, CKD IV, HTN, HLD    Assessment / Plan / Recommendation  Clinical Impression  Pt reports no prior difficulty with swallowing, although does endorse a history of reflux. Oral motor exam significant for L sided asymmetry. Observed pt with consecutive sips via straw totaling the contents of an 8 oz cup of water with only one instance of a delayed cough, although note frequent belching. Trials of purees and solids observed with no signs of  dysphagia. Pt will likely require total assistance for feeding. Recommend initiating diet of Dys 3 textures with thin liquids and full supervision. Will f/u as able. SLP Visit Diagnosis: Dysphagia, unspecified (R13.10)    Aspiration Risk  Mild aspiration risk    Diet Recommendation Dysphagia 3 (Mech soft);Thin liquid    Liquid Administration via: Cup;Straw Medication Administration: Whole meds with liquid Supervision: Staff to assist with self feeding;Full supervision/cueing for compensatory strategies Compensations: Slow rate;Small sips/bites Postural Changes: Seated upright at 90 degrees;Remain upright for at least 30 minutes after po intake    Other  Recommendations Oral Care Recommendations: Oral care BID;Staff/trained caregiver to provide oral care    Recommendations for follow up therapy are one component of a multi-disciplinary discharge planning process, led by the attending physician.  Recommendations may be updated based on patient status, additional functional criteria and insurance authorization.  Follow up Recommendations No SLP follow  up      Assistance Recommended at Discharge    Functional Status Assessment Patient has had a recent decline in their functional status and demonstrates the ability to make significant improvements in function in a reasonable and predictable amount of time.  Frequency and Duration min 2x/week  2 weeks       Prognosis Prognosis for improved oropharyngeal function: Good Barriers to Reach Goals: Cognitive deficits      Swallow Study   General HPI: CHERRISH DEITERING is an 82 yo female presenting to ED 10/17 with AMS and L facial droop. CTH and MRI Brain negative. EEG suggestive of mild to moderate encephalopathy. PMH includes CAD s/p CABG, COPD, CKD IV, HTN, HLD Type of Study: Bedside Swallow Evaluation Previous Swallow Assessment: none in chart Diet Prior to this Study: NPO Temperature Spikes Noted: No Respiratory Status: Room  air History of Recent Intubation: No Behavior/Cognition: Alert;Cooperative;Requires cueing Oral Cavity Assessment: Within Functional Limits Oral Care Completed by SLP: No Oral Cavity - Dentition: Adequate natural dentition Vision: Functional for self-feeding Self-Feeding Abilities: Needs assist Patient Positioning: Upright in bed Baseline Vocal Quality: Normal Volitional Cough: Strong Volitional Swallow: Able to elicit    Oral/Motor/Sensory Function Overall Oral Motor/Sensory Function: Mild impairment Facial Symmetry: Abnormal symmetry left Facial Strength: Reduced left Facial Sensation: Within Functional Limits   Ice Chips Ice chips: Not tested   Thin Liquid Thin Liquid: Impaired Presentation: Straw;Self Fed Pharyngeal  Phase Impairments: Cough - Delayed    Nectar Thick Nectar Thick Liquid: Not tested   Honey Thick Honey Thick Liquid: Not tested   Puree Puree: Within functional limits Presentation: Spoon;Self Fed   Solid     Solid: Within functional limits Presentation: Self Fed      Gwynneth Aliment, M.A., CF-SLP Speech Language Pathology, Acute Rehabilitation Services  Secure Chat preferred [phone]  02/15/2023,4:38 PM

## 2023-02-15 NOTE — Plan of Care (Signed)
  Problem: Respiratory: Goal: Will regain and/or maintain adequate ventilation Outcome: Progressing   Problem: Clinical Measurements: Goal: Will remain free from infection Outcome: Progressing Goal: Respiratory complications will improve Outcome: Progressing   Problem: Activity: Goal: Risk for activity intolerance will decrease Outcome: Progressing   Problem: Pain Managment: Goal: General experience of comfort will improve Outcome: Progressing   Problem: Safety: Goal: Ability to remain free from injury will improve Outcome: Progressing

## 2023-02-15 NOTE — ED Provider Notes (Signed)
Patient seen after prior EDP.  Patient requires Cleviprex drip for treatment of significant acute hypertension.  Critical care made aware of case.  They will evaluate for admission.  MRI brain pending at time of admission.   Wynetta Fines, MD 02/15/23 1201

## 2023-02-15 NOTE — Progress Notes (Signed)
STAT EEG complete. Results pending

## 2023-02-15 NOTE — ED Triage Notes (Signed)
Pt went to bed at 2200 at baseline mentation and husband woke up at 0430 this morning still in chair and was lethargic with had left sided facial droop and left sided weakness

## 2023-02-15 NOTE — Procedures (Signed)
Patient Name: [name]  MRN: [id number]  Epilepsy Attending: Charlsie Quest  Referring Physician/Provider: Lynnell Catalan, MD  Date: 02/15/2023 Duration: 22.26 mins  Patient history: 82 yo F with acutely altered mental status with associated hypertensive crisis, but without visible seizure activity and only left facial droop as a consistent focal deficit. EEG to evaluate for seizure  Level of alertness: Awake  AEDs during EEG study: None  Technical aspects: This EEG study was done with scalp electrodes positioned according to the 10-20 International system of electrode placement. Electrical activity was reviewed with band pass filter of 1-70Hz , sensitivity of 7 uV/mm, display speed of 13mm/sec with a 60Hz  notched filter applied as appropriate. EEG data were recorded continuously and digitally stored.  Video monitoring was available and reviewed as appropriate.  Description: EEG showed continuous generalized predominantly 5 to 6 Hz theta slowing admixed with intermittent 2-3hz  delta slowing. Hyperventilation and photic stimulation were not performed.     ABNORMALITY - Continuous slow, generalized  IMPRESSION: This study is suggestive of mild to moderate diffuse encephalopathy. No seizures or epileptiform discharges were seen throughout the recording.   [name]

## 2023-02-15 NOTE — Progress Notes (Signed)
SLP Cancellation Note  Patient Details Name: [name] MRN: [id number] DOB: 02-Dec-1940   Cancelled treatment:       Reason Eval/Treat Not Completed: Patient at procedure or test/unavailable;pt off of the floor when swallow evaluation attempted.  ST will continue efforts.   Pat [name],M.S.,CCC-SLP 02/15/2023, 1:45 PM

## 2023-02-15 NOTE — ED Notes (Signed)
Pt incontinent of stool . Cleaned and pt transported with RN to CT

## 2023-02-15 NOTE — ED Notes (Signed)
Cleviprex discontinued by Dr. Rodena Medin

## 2023-02-15 NOTE — ED Notes (Signed)
ED TO INPATIENT HANDOFF REPORT  ED Nurse Name and Phone #: Melburn Popper Name/Age/Gender [name] 82 y.o. female Room/Bed: 017C/017C  Code Status   Code Status: Limited: Do not attempt resuscitation (DNR) -DNR-LIMITED -Do Not Intubate/DNI   Home/SNF/Other Home Patient oriented to: self Is this baseline? No   Triage Complete: Triage complete  Chief Complaint Hypertensive urgency [I16.0]  Triage Note Pt went to bed at 2200 at baseline mentation and husband woke up at 0430 this morning still in chair and was lethargic with had left sided facial droop and left sided weakness   Allergies Allergies  Allergen Reactions   Nsaids Other (See Comments)    Stomach upset Told to avoid due to Eliquis   Aspirin Other (See Comments)    Stomach upset Told to avoid due to Eliquis   Codeine Nausea And Vomiting   Nitrostat [Nitroglycerin] Other (See Comments)    Bradycardia. Drop in heart rate    Tramadol     Confusion    Level of Care/Admitting Diagnosis ED Disposition     ED Disposition  Admit   Condition  --   Comment  Hospital [facility] [[id number]]  Level of Care: ICU [6]  May admit patient to [facility] or [facility] if equivalent level of care is available:: Yes  Covid Evaluation: Asymptomatic - no recent exposure (last 10 days) testing not required  Diagnosis: Hypertensive urgency [161096]  Admitting Physician: PCCM, MD [id number]  Attending Physician: Lynnell Catalan [phone]  Certification:: I certify there are rare and unusual circumstances requiring inpatient admission  Expected Medical Readiness: 02/20/2023          B Medical/Surgery History Past Medical History:  Diagnosis Date   Abdominal aortic aneurysm (HCC)    REPAIRED IN 1996 BY [name]  AND HAS RECENTLY BEEN FOLLOWED BY [name]   Acquired asplenia     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 Most likely scar. Mild subendocardial ischemia. EF 71% LOW RISK.    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 Kellie Simmering   Pulmonary emboli [facility]) 02/06/2022   01/2022 PE/DVT post-op. Lower GI bleed     Treat edema  Start Eliquis at the reduced dose due to Creat>1.5 and age >87   Rheumatoid arthritis (HCC) 2011   Dr.Truslow; fracture knees, hands and wrists -    S/P CABG x 1 1996   CABG--LIMA-LAD for  ostial LAD (not felt to be PCI amenable). EF NORMAL then; LIMA now atretic   Shortness of breath dyspnea    with exertion   Stroke (HCC) 11/2014   TIA    Urinary frequency    Past Surgical History:  Procedure Laterality Date   ABDOMINAL AORTIC ANEURYSM REPAIR  1996   Complicated by mesenteric artery stenosis and splenic artery infarction with acquired Asplenia   APPENDECTOMY     BIOPSY  07/08/2022   Procedure: BIOPSY;  Surgeon: Benancio Deeds, MD;  Location: [facility] ENDOSCOPY;  Service: Gastroenterology;;   BIOPSY  11/28/2022   Procedure: BIOPSY;  Surgeon: [name], MD;  Location: [facility] ENDOSCOPY;  Service: Gastroenterology;;   [name]  07/2011   right foot    CARDIAC CATHETERIZATION  2005   (Most recent CATH) - ostial LAD lesion 20-30% (down from 90% initially). Atretic LIMA. Minimal disease the RCA and Circumflex system.   CARPAL TUNNEL RELEASE Left    CATARACT EXTRACTION Bilateral    CERVICAL SPINE SURGERY     plate 0981 Dr. Wynetta Emery   COLONOSCOPY     COLONOSCOPY WITH PROPOFOL N/A 11/28/2022   Procedure: COLONOSCOPY WITH PROPOFOL;  Surgeon: [name], MD;  Location: [facility] ENDOSCOPY;  Service: Gastroenterology;  Laterality: N/A;   CORONARY ARTERY BYPASS GRAFT  1996   INCLUDED AN INTERNAL MAMMARY ARTERY TO THE LAD. EF WAS NORMAL   ESOPHAGOGASTRODUODENOSCOPY (EGD) WITH PROPOFOL N/A 07/08/2022   Procedure: ESOPHAGOGASTRODUODENOSCOPY (EGD) WITH PROPOFOL;  Surgeon: Benancio Deeds, MD;  Location: [facility] ENDOSCOPY;  Service: Gastroenterology;  Laterality: N/A;   ESOPHAGOGASTRODUODENOSCOPY (EGD) WITH PROPOFOL N/A 11/28/2022   Procedure: ESOPHAGOGASTRODUODENOSCOPY (EGD) WITH PROPOFOL;  Surgeon: [name], MD;  Location: [facility] ENDOSCOPY;  Service: Gastroenterology;  Laterality: N/A;   INGUINAL HERNIA REPAIR Right    LAPAROSCOPIC APPENDECTOMY N/A 06/28/2016   Procedure: APPENDECTOMY LAPAROSCOPIC;  Surgeon: [name], MD;  Location: [facility] ORS;  Service: General;  Laterality: N/A;   NM MYOVIEW LTD  06/2010   Fixed anteroseptal, apical and inferoapical defect with moderate size. Most likely scar. Mild subendocardial ischemia. EF 71% LOW RISK.    POLYPECTOMY  11/28/2022   Procedure: POLYPECTOMY;  Surgeon: [name], MD;  Location: [facility] ENDOSCOPY;  Service: Gastroenterology;;   SPLENECTOMY     TOTAL HIP ARTHROPLASTY Left 01/23/2022   Procedure: LEFT TOTAL HIP ARTHROPLASTY ANTERIOR APPROACH;  Surgeon: [name], MD;  Location: [facility] OR;  Service: Orthopedics;  Laterality: Left;  3-C   TRANSTHORACIC ECHOCARDIOGRAM  12/2014   [facility]: Normal LV size & function. EF 55-60%,    vagina polyp     VIDEO ASSISTED THORACOSCOPY (VATS)/WEDGE RESECTION  Left 03/02/2015   Procedure: VIDEO ASSISTED THORACOSCOPY (VATS), MINI THORACOTOMY, LEFT UPPER LOBE WEDGE, TAKE DOWN OF INTERNAL MAMMARY LESIONS, PLACEMENT OF ON-Q PUMP;  Surgeon: [name], MD;  Location: [facility] OR;  Service: Thoracic;  Laterality: Left;   VIDEO BRONCHOSCOPY N/A 03/02/2015   Procedure: BRONCHOSCOPY;  Surgeon: [name], MD;  Location: [facility] OR;  Service: Thoracic;  Laterality: N/A;   VIDEO BRONCHOSCOPY WITH ENDOBRONCHIAL NAVIGATION N/A 10/08/2017   Procedure: VIDEO BRONCHOSCOPY WITH ENDOBRONCHIAL NAVIGATION WITH BIOPSIES OF LEFT UPPER LOBE AND LEFT LOWER LOBE;  Surgeon: [name], MD;  Location: [facility] OR;  Service: Thoracic;  Laterality: N/A;     A IV Location/Drains/Wounds Patient Lines/Drains/Airways Status     Active Line/Drains/Airways     Name Placement date Placement time Site Days   Peripheral IV 02/15/23 Posterior;Proximal;Right  Forearm 02/15/23  0653  Forearm  less than 1   Peripheral IV 02/15/23 20 G Posterior;Right Hand 02/15/23  0715  Hand  less than 1   Urethral Catheter Shawn R, RN Temperature probe 16 Fr. 02/15/23  0745  Temperature probe  less than 1   Wound / Incision (Open or Dehisced) 02/17/22 Irritant Dermatitis (Moisture Associated Skin Damage) Perineum Bilateral 02/17/22  0130  Perineum  363            Intake/Output Last 24 hours No intake or output data in the 24 hours ending 02/15/23 1044  Labs/Imaging Results for orders placed or performed during the hospital encounter of 02/15/23 (from the past 48 hour(s))  Brain natriuretic peptide     Status: Abnormal   Collection Time: 02/15/23  5:50 AM  Result Value Ref Range   B Natriuretic Peptide 211.0 (H) 0.0 - 100.0 pg/mL    Comment: Performed at [facility] Lab, [address] [place], [place] [address]  Ethanol     Status: None   Collection Time: 02/15/23  5:51 AM  Result Value Ref Range   Alcohol, Ethyl (B) <10 <10 mg/dL    Comment: (NOTE) Lowest detectable limit for  serum alcohol is 10 mg/dL.  For medical purposes only. Performed at [facility], [address] [place], [place] [address]   CBC     Status: Abnormal   Collection Time: 02/15/23  5:51 AM  Result Value Ref Range   WBC 9.4 4.0 - 10.5 K/uL   RBC 3.93 3.87 - 5.11 MIL/uL   Hemoglobin 12.8 12.0 - 15.0 g/dL   HCT 09.8 11.9 - 14.7 %   MCV 100.8 (H) 80.0 - 100.0 fL   MCH 32.6 26.0 - 34.0 pg   MCHC 32.3 30.0 - 36.0 g/dL   RDW 82.9 (H) 56.2 - 13.0 %   Platelets 329 150 - 400 K/uL   nRBC 0.6 (H) 0.0 - 0.2 %    Comment: Performed at [facility], [address] [place], [place] [address]  Differential     Status: Abnormal   Collection Time: 02/15/23  5:51 AM  Result Value Ref Range   Neutrophils Relative % 84 %   Neutro Abs 7.9 (H) 1.7 - 7.7 K/uL   Lymphocytes Relative 7 %   Lymphs Abs 0.6 (L) 0.7 - 4.0 K/uL   Monocytes Relative 8 %   Monocytes Absolute 0.7 0.1 - 1.0 K/uL   Eosinophils Relative 0 %   Eosinophils Absolute 0.0 0.0 - 0.5 K/uL   Basophils Relative 1 %   Basophils Absolute 0.1 0.0 - 0.1 K/uL   Immature Granulocytes 0 %   Abs Immature Granulocytes 0.04 0.00 - 0.07 K/uL    Comment: Performed at [facility], [address], [place], [place] [address]  I-stat chem 8, ED     Status: Abnormal   Collection Time: 02/15/23  6:35 AM  Result Value Ref Range   Sodium 127 (L) 135 - 145 mmol/L   Potassium 6.8 (HH) 3.5 - 5.1 mmol/L   Chloride 100 98 - 111 mmol/L   BUN 129 (H) 8 - 23 mg/dL   Creatinine, Ser 9.52 (H) 0.44 - 1.00 mg/dL   Glucose, Bld 841 (H) 70 - 99 mg/dL    Comment: Glucose reference range applies only to samples taken after fasting for at least 8 hours.   Calcium, Ion 1.00 (L) 1.15 - 1.40 mmol/L   TCO2 20 (L) 22 -  32 mmol/L   Hemoglobin 15.0 12.0 - 15.0 g/dL   HCT 64.3 32.9 - 51.8 %   Comment NOTIFIED PHYSICIAN   Comprehensive metabolic panel     Status: Abnormal   Collection Time: 02/15/23  6:45 AM  Result Value Ref Range   Sodium 131 (L)  135 - 145 mmol/L   Potassium 3.9 3.5 - 5.1 mmol/L   Chloride 94 (L) 98 - 111 mmol/L   CO2 19 (L) 22 - 32 mmol/L   Glucose, Bld 163 (H) 70 - 99 mg/dL    Comment: Glucose reference range applies only to samples taken after fasting for at least 8 hours.   BUN 90 (H) 8 - 23 mg/dL   Creatinine, Ser 8.41 (H) 0.44 - 1.00 mg/dL   Calcium 9.8 8.9 - 66.0 mg/dL   Total Protein 7.0 6.5 - 8.1 g/dL   Albumin 3.6 3.5 - 5.0 g/dL   AST 24 15 - 41 U/L   ALT 17 0 - 44 U/L   Alkaline Phosphatase 81 38 - 126 U/L   Total Bilirubin 0.9 0.3 - 1.2 mg/dL   GFR, Estimated 8 (L) >60 mL/min    Comment: (NOTE) Calculated using the CKD-EPI Creatinine Equation (2021)    Anion gap 18 (H) 5 - 15    Comment: Performed at [facility], [address], [place], [place] [address]  Protime-INR     Status: None   Collection Time: 02/15/23  6:45 AM  Result Value Ref Range   Prothrombin Time 12.9 11.4 - 15.2 seconds   INR 1.0 0.8 - 1.2    Comment: (NOTE) INR goal varies based on device and disease states. Performed at [facility], [address], [place], [place] [address]   APTT     Status: None   Collection Time: 02/15/23  6:45 AM  Result Value Ref Range   aPTT 27 24 - 36 seconds    Comment: Performed at [facility], [address], [place], [place] [address]  Troponin I (High Sensitivity)     Status: Abnormal   Collection Time: 02/15/23  6:45 AM  Result Value Ref Range   Troponin I (High Sensitivity) 34 (H) <18 ng/L    Comment: (NOTE) Elevated high sensitivity troponin I (hsTnI) values and significant  changes across serial measurements may suggest ACS but many other  chronic and acute conditions are known to elevate hsTnI results.  Refer to the "Links" section for chest pain algorithms and additional  guidance. Performed at [facility], [address], [place], [place] [address]   TSH     Status: None   Collection Time: 02/15/23  6:45 AM  Result Value Ref Range   TSH 2.829  0.350 - 4.500 uIU/mL    Comment: Performed by a 3rd Generation assay with a functional sensitivity of <=0.01 uIU/mL. Performed at [facility], [address] [place], [place] [address]   Urine rapid drug screen (hosp performed)     Status: None   Collection Time: 02/15/23  7:35 AM  Result Value Ref Range   Opiates NONE DETECTED NONE DETECTED   Cocaine NONE DETECTED NONE DETECTED   Benzodiazepines NONE DETECTED NONE DETECTED   Amphetamines NONE DETECTED NONE DETECTED   Tetrahydrocannabinol NONE DETECTED NONE DETECTED   Barbiturates NONE DETECTED NONE DETECTED    Comment: (NOTE) DRUG SCREEN FOR MEDICAL PURPOSES ONLY.  IF CONFIRMATION IS NEEDED FOR ANY PURPOSE, NOTIFY LAB WITHIN 5 DAYS.  LOWEST DETECTABLE LIMITS FOR URINE DRUG SCREEN Drug Class                     Cutoff (ng/mL) Amphetamine and metabolites    1000 Barbiturate and metabolites    200 Benzodiazepine                 200 Opiates and metabolites        300 Cocaine and metabolites        300 THC                            50 Performed at [facility], [address] [place], [place] [address]   Urinalysis, Routine w reflex microscopic -Urine, Clean Catch     Status: Abnormal   Collection Time: 02/15/23  7:35 AM  Result Value Ref Range   Color, Urine YELLOW YELLOW   APPearance HAZY (A) CLEAR   Specific Gravity, Urine 1.010 1.005 - 1.030   pH 5.0 5.0 - 8.0   Glucose, UA 50 (A) NEGATIVE mg/dL   Hgb urine dipstick NEGATIVE NEGATIVE   Bilirubin Urine NEGATIVE NEGATIVE   Ketones, ur 5 (A) NEGATIVE mg/dL   Protein, ur >=784 (A) NEGATIVE mg/dL   Nitrite NEGATIVE NEGATIVE   Leukocytes,Ua TRACE (A) NEGATIVE   RBC / HPF 0-5 0 - 5 RBC/hpf   WBC, UA 6-10 0 - 5 WBC/hpf   Bacteria, UA RARE (A) NONE SEEN   Squamous Epithelial / HPF 0-5 0 - 5 /HPF   Hyaline Casts, UA PRESENT     Comment: Performed at [facility], [address] [place], [place] [address]  Troponin I (High Sensitivity)     Status:  Abnormal   Collection Time: 02/15/23  8:31 AM  Result Value Ref Range   Troponin I (High Sensitivity) 51 (H) <18 ng/L    Comment: (NOTE) Elevated high sensitivity troponin I (hsTnI) values and significant  changes across serial measurements may suggest ACS but many other  chronic and acute conditions are known to elevate hsTnI results.  Refer to the "Links" section for chest pain algorithms and additional  guidance. Performed at [facility], [address], [place], [place] [address]    *Note: Due to a large number of results and/or encounters for the requested time period, some results have not been displayed. A complete set of results can be found in Results Review.   DG Chest Port 1 View  Result Date: 02/15/2023 CLINICAL DATA:  82 year old female unresponsive patient. EXAM: PORTABLE CHEST 1 VIEW COMPARISON:  Chest x-ray 12/10/2022. FINDINGS: Lung volumes are low. No consolidative airspace disease. No pleural effusions. No pneumothorax. No evidence of pulmonary edema. Heart size is normal. Upper mediastinal contours are within normal limits. Status post median sternotomy for CABG. Atherosclerotic calcifications are noted in the thoracic aorta. IMPRESSION: 1. Low lung volumes without radiographic evidence of acute cardiopulmonary disease. 2. Aortic atherosclerosis. Electronically Signed   By: Trudie Reed M.D.   On: 02/15/2023 07:33   CT HEAD WO CONTRAST  Result Date: 02/15/2023 CLINICAL DATA:  Lethargy.  Left-sided facial droop and weakness. EXAM: CT HEAD WITHOUT CONTRAST CT CERVICAL SPINE WITHOUT CONTRAST TECHNIQUE: Multidetector CT imaging of the head and cervical spine was performed following the standard protocol without intravenous contrast. Multiplanar CT image reconstructions of the cervical spine were also generated. RADIATION DOSE REDUCTION: This exam was performed according to the departmental dose-optimization  program which includes automated exposure control,  adjustment of the mA and/or kV according to patient size and/or use of iterative reconstruction technique. COMPARISON:  08/02/2022 FINDINGS: CT HEAD FINDINGS Brain: No evidence of acute infarction, hemorrhage, hydrocephalus, extra-axial collection or mass lesion/mass effect. Generalized brain atrophy in keeping with age Vascular: No hyperdense vessel or unexpected calcification. Skull: Normal. Negative for fracture or focal lesion. Sinuses/Orbits: No acute finding. CT CERVICAL SPINE FINDINGS Alignment: Normal. Skull base and vertebrae: No acute fracture. No primary bone lesion or focal pathologic process. C3-C5 ACDF with solid arthrodesis Soft tissues and spinal canal: No prevertebral fluid or swelling. No visible canal hematoma. Disc levels: Disc collapse and endplate spurring especially at C5-6 to C6-7. 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 Upper chest: No visible injury IMPRESSION: No acute finding in the head or cervical spine. No specific cause for symptoms. Electronically Signed   By: Tiburcio Pea M.D.   On: 02/15/2023 06:57   CT CERVICAL SPINE WO CONTRAST  Result Date: 02/15/2023 CLINICAL DATA:  Lethargy.  Left-sided facial droop and weakness. EXAM: CT HEAD WITHOUT CONTRAST CT CERVICAL SPINE WITHOUT CONTRAST TECHNIQUE: Multidetector CT imaging of the head and cervical spine was performed following the standard protocol without intravenous contrast. Multiplanar CT image reconstructions of the cervical spine were also generated. RADIATION DOSE REDUCTION: This exam was performed according to the departmental dose-optimization program which includes automated exposure control, adjustment of the mA and/or kV according to patient size and/or use of iterative reconstruction technique. COMPARISON:  08/02/2022 FINDINGS: CT HEAD FINDINGS Brain: No evidence of acute infarction, hemorrhage, hydrocephalus, extra-axial collection or mass lesion/mass effect. Generalized brain atrophy in keeping with  age Vascular: No hyperdense vessel or unexpected calcification. Skull: Normal. Negative for fracture or focal lesion. Sinuses/Orbits: No acute finding. CT CERVICAL SPINE FINDINGS Alignment: Normal. Skull base and vertebrae: No acute fracture. No primary bone lesion or focal pathologic process. C3-C5 ACDF with solid arthrodesis Soft tissues and spinal canal: No prevertebral fluid or swelling. No visible canal hematoma. Disc levels: Disc collapse and endplate spurring especially at C5-6 to C6-7. 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 Upper chest: No visible injury IMPRESSION: No acute finding in the head or cervical spine. No specific cause for symptoms. Electronically Signed   By: Tiburcio Pea M.D.   On: 02/15/2023 06:57    Pending Labs Unresulted Labs (From admission, onward)     Start     Ordered   02/16/23 0500  CBC  Tomorrow morning,   R        02/15/23 1031   02/16/23 0500  Basic metabolic panel  Tomorrow morning,   R        02/15/23 1031   02/16/23 0500  Magnesium  Tomorrow morning,   R        02/15/23 1031   02/16/23 0500  Phosphorus  Tomorrow morning,   R        02/15/23 1031            Vitals/Pain Today's Vitals   02/15/23 1000 02/15/23 1005 02/15/23 1015 02/15/23 1030  BP: (!) 182/69 (!) 151/74 (!) 189/82 (!) 186/63  Pulse: 86 87 87 82  Resp: 17 (!) 24 15 14   Temp: (!) 97.5 F (36.4 C) (!) 97.4 F (36.3 C) (!) 97.4 F (36.3 C) (!) 97.3 F (36.3 C)  TempSrc:      SpO2: 91% 94% 95% 94%  PainSc:        Isolation Precautions No  active isolations  Medications Medications  clevidipine (CLEVIPREX) infusion 0.5 mg/mL (2 mg/hr Intravenous Restarted 02/15/23 0936)  albuterol (VENTOLIN HFA) 108 (90 Base) MCG/ACT inhaler 1-2 puff (has no administration in time range)  acetaminophen (TYLENOL) tablet 650 mg (has no administration in time range)  amLODipine (NORVASC) tablet 5 mg (has no administration in time range)  ascorbic acid (VITAMIN C) tablet 500 mg (has  no administration in time range)  atorvastatin (LIPITOR) tablet 80 mg (has no administration in time range)  benzonatate (TESSALON) capsule 100 mg (has no administration in time range)  carvedilol (COREG) tablet 6.25 mg (has no administration in time range)  Vitamin D3 2,000 Units (has no administration in time range)  cloNIDine (CATAPRES - Dosed in mg/24 hr) patch 0.2 mg (has no administration in time range)  clopidogrel (PLAVIX) tablet 75 mg (has no administration in time range)  Cyanocobalamin TABS 2,500 mcg (has no administration in time range)  fexofenadine (ALLEGRA) tablet 180 mg (has no administration in time range)  leflunomide (ARAVA) tablet 20 mg (has no administration in time range)  levothyroxine (SYNTHROID) tablet 88 mcg (has no administration in time range)  Centrum Adult LIQD (has no administration in time range)  pantoprazole (PROTONIX) EC tablet 40 mg (has no administration in time range)  sertraline (ZOLOFT) tablet 25 mg (has no administration in time range)  triamcinolone (NASACORT) nasal inhaler 1 spray (has no administration in time range)  valACYclovir (VALTREX) tablet 1,000 mg (has no administration in time range)  heparin injection 5,000 Units (has no administration in time range)  docusate sodium (COLACE) capsule 100 mg (has no administration in time range)  polyethylene glycol (MIRALAX / GLYCOLAX) packet 17 g (has no administration in time range)  labetalol (NORMODYNE) injection 10 mg (10 mg Intravenous Given 02/15/23 0653)  calcium gluconate 1 g/ 50 mL sodium chloride IVPB (0 mg Intravenous Stopped 02/15/23 0820)  sodium bicarbonate injection 50 mEq (50 mEq Intravenous Given 02/15/23 0731)    Mobility walks with device     Focused Assessments     R Recommendations: See Admitting Provider Note  Report given to:   Additional Notes:

## 2023-02-16 DIAGNOSIS — I674 Hypertensive encephalopathy: Secondary | ICD-10-CM | POA: Diagnosis not present

## 2023-02-16 DIAGNOSIS — I160 Hypertensive urgency: Secondary | ICD-10-CM | POA: Diagnosis not present

## 2023-02-16 LAB — PHOSPHORUS: Phosphorus: 5.7 mg/dL — ABNORMAL HIGH (ref 2.5–4.6)

## 2023-02-16 LAB — BASIC METABOLIC PANEL
Anion gap: 19 — ABNORMAL HIGH (ref 5–15)
BUN: 77 mg/dL — ABNORMAL HIGH (ref 8–23)
CO2: 19 mmol/L — ABNORMAL LOW (ref 22–32)
Calcium: 9.7 mg/dL (ref 8.9–10.3)
Chloride: 98 mmol/L (ref 98–111)
Creatinine, Ser: 4.28 mg/dL — ABNORMAL HIGH (ref 0.44–1.00)
GFR, Estimated: 10 mL/min — ABNORMAL LOW (ref >60)
Glucose, Bld: 110 mg/dL — ABNORMAL HIGH (ref 70–99)
Potassium: 3.8 mmol/L (ref 3.5–5.1)
Sodium: 136 mmol/L (ref 135–145)

## 2023-02-16 LAB — CBC
HCT: 37.8 % (ref 36.0–46.0)
Hemoglobin: 12.6 g/dL (ref 12.0–15.0)
MCH: 33 pg (ref 26.0–34.0)
MCHC: 33.3 g/dL (ref 30.0–36.0)
MCV: 99 fL (ref 80.0–100.0)
Platelets: 285 10*3/uL (ref 150–400)
RBC: 3.82 MIL/uL — ABNORMAL LOW (ref 3.87–5.11)
RDW: 17 % — ABNORMAL HIGH (ref 11.5–15.5)
WBC: 10.6 10*3/uL — ABNORMAL HIGH (ref 4.0–10.5)
nRBC: 0.9 % — ABNORMAL HIGH (ref 0.0–0.2)

## 2023-02-16 LAB — TRIGLYCERIDES: Triglycerides: 163 mg/dL — ABNORMAL HIGH (ref <150)

## 2023-02-16 LAB — GLUCOSE, CAPILLARY
Glucose-Capillary: 104 mg/dL — ABNORMAL HIGH (ref 70–99)
Glucose-Capillary: 117 mg/dL — ABNORMAL HIGH (ref 70–99)

## 2023-02-16 LAB — MAGNESIUM: Magnesium: 2.2 mg/dL (ref 1.7–2.4)

## 2023-02-16 MED ORDER — CARVEDILOL 25 MG PO TABS
25.0000 mg | ORAL_TABLET | Freq: Two times a day (BID) | ORAL | Status: DC
  Administered 2023-02-16 – 2023-02-17 (×2): 25 mg via ORAL
  Filled 2023-02-16 (×2): qty 1

## 2023-02-16 MED ORDER — AMLODIPINE BESYLATE 10 MG PO TABS
10.0000 mg | ORAL_TABLET | Freq: Every day | ORAL | Status: DC
  Administered 2023-02-16: 5 mg via ORAL
  Administered 2023-02-17: 10 mg via ORAL
  Filled 2023-02-16: qty 1

## 2023-02-16 MED ORDER — LABETALOL HCL 5 MG/ML IV SOLN
10.0000 mg | INTRAVENOUS | Status: DC | PRN
  Administered 2023-02-16 – 2023-02-17 (×5): 10 mg via INTRAVENOUS
  Filled 2023-02-16 (×5): qty 4

## 2023-02-16 MED ORDER — CARVEDILOL 12.5 MG PO TABS
12.5000 mg | ORAL_TABLET | Freq: Two times a day (BID) | ORAL | Status: DC
  Administered 2023-02-16: 12.5 mg via ORAL
  Filled 2023-02-16: qty 1

## 2023-02-16 MED ORDER — HYDRALAZINE HCL 20 MG/ML IJ SOLN
10.0000 mg | Freq: Four times a day (QID) | INTRAMUSCULAR | Status: DC | PRN
  Administered 2023-02-16 – 2023-02-17 (×2): 10 mg via INTRAVENOUS
  Filled 2023-02-16 (×2): qty 1

## 2023-02-16 MED ORDER — CARVEDILOL 6.25 MG PO TABS
6.2500 mg | ORAL_TABLET | Freq: Once | ORAL | Status: AC
  Administered 2023-02-16: 6.25 mg via ORAL
  Filled 2023-02-16: qty 1

## 2023-02-16 MED ORDER — CARVEDILOL 6.25 MG PO TABS: 6.2500 mg | ORAL_TABLET | Freq: Once | ORAL | Status: AC

## 2023-02-16 NOTE — Progress Notes (Signed)
Speech Language Pathology Treatment: Dysphagia  Patient Details Name: SARON KEIGHTLEY MRN: [id number] DOB: 1940/06/22 Today's Date: 02/16/2023 Time: 7829-5621 SLP Time Calculation (min) (ACUTE ONLY): 12 min  Assessment / Plan / Recommendation Clinical Impression  Pt awakened easily and participated in self-feeding of mechanical soft solids and thin liquids. She is confused but redirectable throughout trials. There is mild oral residue, for which she benefits from Min cues to use liquid wash. Recommend continuing with Dys 3 diet and thin liquids. Would recommend assistance during meals to facilitate intake given current mentation.    HPI HPI: LETISHA AUSMUS is an 82 yo female presenting to ED 10/17 with AMS and L facial droop. CTH and MRI Brain negative. EEG suggestive of mild to moderate encephalopathy. PMH includes CAD s/p CABG, COPD, CKD IV, HTN, HLD      SLP Plan  Continue with current plan of care      Recommendations for follow up therapy are one component of a multi-disciplinary discharge planning process, led by the attending physician.  Recommendations may be updated based on patient status, additional functional criteria and insurance authorization.    Recommendations  Diet recommendations: Dysphagia 3 (mechanical soft);Thin liquid Liquids provided via: Cup;Straw Medication Administration: Whole meds with liquid Supervision: Staff to assist with self feeding Compensations: Slow rate;Small sips/bites Postural Changes and/or Swallow Maneuvers: Seated upright 90 degrees                  Oral care BID;Staff/trained caregiver to provide oral care     Dysphagia, unspecified (R13.10)     Continue with current plan of care     Mahala Menghini., M.A. CCC-SLP Acute Rehabilitation Services Office [phone]  Secure chat preferred   02/16/2023, 10:35 AM

## 2023-02-16 NOTE — Plan of Care (Signed)
  Problem: Education: Goal: Understanding of post-operative needs will improve Outcome: Not Progressing

## 2023-02-16 NOTE — Progress Notes (Signed)
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 Per EDP, patient last known well around 10PM, and husband woke up at 0430 to find patient in chair confused. EMS was called and patient noted to be hypertensive. On arrival to ED SBP 244. EDP noted subtle left facial droop, LUE weakness. CT head without acute bleed. She was given IV labetalol and placed on Cleviprex gtt. Labs notable for Na 131, BUN 90, Cr 4.79, AGMA with AG 18, bicarb 19. BNP 211, trop 34 (delta pending), TSH normal, UA without UTI, UDS negative. Neurology was consulted and recommended MRI which ordered. PCCM was consulted for admission on Cleviprex gtt.  On my exam 10/17, patient is confused. Oriented to self. Left sided facial droop present. Husband is at bedside. He endorses the above story. He does note she fell 2-3 days ago, striking her head. They were not seen for this. States she was a bit confused last night on the phone with her sister, but did not think much of it until this morning. States she is currently being treated for shingles but no other changes to her medications or dosing. Takes her medications as prescribed.   Pertinent  Medical History  CAD s/p CABG x1, DVT/PE, COPD, CKD IV, RA, HTN, HLD  Significant Hospital Events: Including procedures, antibiotic start and stop dates in addition to other pertinent events   10/17 admitted to [facility]. MRI negative.  10/18: continues on cleviprex. Increasing amlodipine to aid off gtt. Continues to be confused/hallucinating.   Interim History / Subjective:  No complaints. She is  only oriented to self.   Objective   Blood pressure (!) 162/60, pulse 92, temperature 98.8 F (37.1 C), temperature source Axillary, resp. rate 18, height 5\' 3"  (1.6 m), weight 53.9 kg, SpO2 96%.        Intake/Output Summary (Last 24 hours) at 02/16/2023 0705 Last data filed at 02/16/2023 0600 Gross per 24 hour  Intake 206 ml  Output 2700 ml  Net -2494 ml   Filed Weights   02/15/23 1139  Weight: 53.9 kg    Examination: General: elderly female laying in bed, somewhat disheveled, confused HENT: mucous membranes moist, PERRLA, EOMI Lungs: clear bilaterally  Cardiovascular: s1/s2 without murmur, rub, gallop Abdomen: soft, non-tender Extremities: no pitting edema Neuro: Left facial droop just at the mouth, mild dysarthria. Oriented to self only. Follows commands. No appreciative focal extremity weakness  GU: foley   Resolved Hospital Problem list     Assessment & Plan:  Hypertensive crisis  Left-sided facial droop Altered mental status; CT and MRI are negative for acute stroke or other acute findings. Continues to be confused and hallucinating. May all be PRES which may take days to improve. Additionally, on chart review appears she was complaining of confusion to PCP for days prior to presentation.  - con't cleviprex goal SBP < 185, DBP < 110  - increasing amlodipine 5->10mg  and coreg 6.25->12.5mg  to help aid off gtt. Continue clonidine,  - trend trop to peak 34>51, EKG without ST-T wave changes.  - neuro consulted by EDP, appreciate recs   Herpes Zoster ; ?  if lower facial droop is partial nerve palsy 2/2 zoster since MRI is negative. Additionally, wondering if component of her confusion is neurotoxicity from supratherapeutic dosing on valtrex outpatient. There is patient messages to PCP regarding confusion after starting Valtrex days prior to presentation. Also has AKI which may be related to Valtrex.  - dc Valtrex  - can consider herpes encephalitis if she continues to not  clear.   Acute kidney injury on Chronic kidney disease IV (baseline Cr ~3.6); likely from hypertensive crisis, but may be related to supratherapeutic Valtrex dosing outpatient causing AKI - trend BMP  - hold home torsemide for now   Hyperlipidemia  CAD s/p CABG x1  - con't home atorvastatin 80mg  daily   Rheumatoid Arthritis  - con't home leflunomide 2-mg daily   Hypothyroidism  - con't home synthroid daily   Hx PE/DVT  - continue home Plavix  - was on Eliquis but d/c due to recurrent GIB.   Goals of care: discussed with husband and patient at bedside. They are currently working on advanced directives at home but agree to DNR/DNI at this time which is in alignment with her wishes.   Best Practice (right click and "Reselect all SmartList Selections" daily)   Diet/type: Regular consistency (see orders) DVT prophylaxis: DOAC GI prophylaxis: PPI Lines: N/A Foley:  Yes, and it is still needed Code Status:  limited Last date of multidisciplinary goals of care discussion [10/17 spoke with patient and husband at bedside. He states they are working on advanced directives. Notes she would not want CPR or intubation]  Labs   CBC: Recent Labs  Lab 02/13/23 1240 02/15/23 0551 02/15/23 0635 02/16/23 0356  WBC  --  9.4  --  10.6*  NEUTROABS  --  7.9*  --   --   HGB 11.9* 12.8 15.0 12.6  HCT  --  39.6 44.0 37.8  MCV  --  100.8*  --  99.0  PLT  --  329  --  285    Basic Metabolic Panel: Recent Labs  Lab 02/15/23 0635 02/15/23 0645 02/16/23 0356  NA 127* 131* 136  K 6.8* 3.9 3.8  CL 100 94* 98  CO2  --  19* 19*  GLUCOSE 159* 163* 110*  BUN 129* 90* 77*  CREATININE 5.10* 4.79* 4.28*  CALCIUM  --  9.8 9.7  MG  --   --  2.2  PHOS  --   --  5.7*   GFR: Estimated Creatinine Clearance: 8.1 mL/min (A) (by C-G formula based on SCr of 4.28 mg/dL (H)). Recent Labs  Lab 02/15/23 0551 02/16/23 0356  WBC 9.4 10.6*    Liver Function Tests: Recent Labs  Lab  02/15/23 0645  AST 24  ALT 17  ALKPHOS 81  BILITOT 0.9  PROT 7.0  ALBUMIN 3.6   No results for input(s): "LIPASE", "AMYLASE" in the last 168 hours. No results for input(s): "AMMONIA" in the last 168 hours.  ABG    Component Value Date/Time   PHART 7.340 (L) 03/03/2015 0355   PCO2ART 40.9 03/03/2015 0355   PO2ART 70.9 (L) 03/03/2015 0355   HCO3 21.5 03/03/2015 0355   TCO2 20 (L) 02/15/2023 0635   ACIDBASEDEF 3.4 (H) 03/03/2015 0355   O2SAT 92.6 03/03/2015 0355     Coagulation Profile: Recent Labs  Lab 02/15/23 0645  INR 1.0    Cardiac Enzymes: No results for input(s): "CKTOTAL", "CKMB", "CKMBINDEX", "TROPONINI" in the last 168 hours.  HbA1C: No results found for: "HGBA1C"  CBG: Recent Labs  Lab 02/15/23 1134 02/15/23 1517 02/15/23 1915 02/15/23 2316 02/16/23 0328  GLUCAP 133* 110* 98 100* 104*    Review of Systems:   Denies complaints  Past Medical History:  She,  has a past medical history of Abdominal aortic aneurysm (HCC), Acquired asplenia, Acute bronchitis (04/03/2016), Acute respiratory failure with hypoxia (HCC) (04/15/2016), Adenomatous colon polyp, Anemia, BRBPR (bright red blood per rectum) (11/17/2021), CAD in native artery (1996, 2002, 2005 ), Cervical disc disease, Chronic kidney disease, COPD (chronic obstructive pulmonary disease) (HCC), Diverticulosis, DVT (deep venous thrombosis) (HCC), History of DVT (deep vein thrombosis) (03/06/2022), History of pulmonary embolus (PE), Hyperlipidemia, Hypertension, Hypothyroidism (acquired), Myocardial infarction (HCC) (11/2014), Polymyalgia rheumatica (HCC), Pulmonary emboli (HCC) (02/06/2022), Rheumatoid arthritis (HCC) (2011), S/P CABG x 1 (1996), Shortness of breath dyspnea, Stroke (HCC) (11/2014), and Urinary frequency.   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 Atretic LIMA. Minimal disease the RCA and Circumflex system.   CARPAL TUNNEL RELEASE Left    CATARACT EXTRACTION Bilateral    CERVICAL SPINE SURGERY     plate 5366 Dr. Wynetta Emery   COLONOSCOPY     COLONOSCOPY WITH PROPOFOL N/A 11/28/2022   Procedure: COLONOSCOPY WITH PROPOFOL;  Surgeon: [name], MD;  Location: [facility] ENDOSCOPY;  Service: Gastroenterology;  Laterality: N/A;   CORONARY ARTERY BYPASS GRAFT  1996   INCLUDED AN INTERNAL MAMMARY ARTERY TO THE LAD. EF WAS NORMAL   ESOPHAGOGASTRODUODENOSCOPY (EGD) WITH PROPOFOL N/A 07/08/2022   Procedure: ESOPHAGOGASTRODUODENOSCOPY (EGD) WITH PROPOFOL;  Surgeon: Benancio Deeds, MD;  Location: [facility] ENDOSCOPY;  Service: Gastroenterology;  Laterality: N/A;   ESOPHAGOGASTRODUODENOSCOPY (EGD) WITH PROPOFOL N/A 11/28/2022   Procedure: ESOPHAGOGASTRODUODENOSCOPY (EGD) WITH PROPOFOL;  Surgeon: [name], MD;  Location[facility] ENDOSCOPY;  Service: Gastroenterology;  Laterality: N/A;   INGUINAL HERNIA REPAIR Right    LAPAROSCOPIC APPENDECTOMY N/A 06/28/2016   Procedure: APPENDECTOMY LAPAROSCOPIC;  Surgeon: [name], MD;  Location: [facility] ORS;  Service: General;  Laterality: N/A;   NM MYOVIEW LTD  06/2010   Fixed anteroseptal, apical and inferoapical defect with moderate size. Most likely scar. Mild subendocardial ischemia. EF 71% LOW RISK.    POLYPECTOMY  11/28/2022   Procedure: POLYPECTOMY;  Surgeon: [name], MD;  Location: [facility] ENDOSCOPY;  Service: Gastroenterology;;   SPLENECTOMY     TOTAL HIP ARTHROPLASTY  Left 01/23/2022   Procedure: LEFT TOTAL HIP ARTHROPLASTY ANTERIOR APPROACH;  Surgeon: [name], MD;  Location: [facility] OR;  Service: Orthopedics;  Laterality: Left;  3-C   TRANSTHORACIC ECHOCARDIOGRAM  12/2014   [facility]: Normal LV size & function. EF 55-60%,    vagina polyp     VIDEO ASSISTED THORACOSCOPY (VATS)/WEDGE RESECTION Left 03/02/2015   Procedure: VIDEO ASSISTED THORACOSCOPY (VATS), MINI THORACOTOMY, LEFT UPPER LOBE WEDGE, TAKE DOWN OF INTERNAL MAMMARY LESIONS, PLACEMENT OF ON-Q PUMP;  Surgeon: [name], MD;  Location: [facility]  OR;  Service: Thoracic;  Laterality: Left;   VIDEO BRONCHOSCOPY N/A 03/02/2015   Procedure: BRONCHOSCOPY;  Surgeon: [name], MD;  Location: [facility] OR;  Service: Thoracic;  Laterality: N/A;   VIDEO BRONCHOSCOPY WITH ENDOBRONCHIAL NAVIGATION N/A 10/08/2017   Procedure: VIDEO BRONCHOSCOPY WITH ENDOBRONCHIAL NAVIGATION WITH BIOPSIES OF LEFT UPPER LOBE AND LEFT LOWER LOBE;  Surgeon: [name], MD;  Location: [facility] OR;  Service: Thoracic;  Laterality: N/A;     Social History:   reports that she quit smoking about 64 years ago. Her smoking use included cigarettes. She has never been exposed to tobacco smoke. She has never used smokeless tobacco. She reports current alcohol use. She reports that she does not use drugs.   Family History:  Her family history includes Cancer in her paternal uncle; Coronary artery disease in an other family member; Diabetes in her mother; Heart disease in her mother; Hyperlipidemia in her mother, sister, son, son, and son; Hypertension in her daughter, mother, sister, son, son, son, and another family member; Stroke in her father. There is no history of Asthma or Colon cancer.   Allergies Allergies  Allergen Reactions   Nsaids Other (See Comments)    Stomach upset Told to avoid due to Eliquis   Aspirin Other (See Comments)    Stomach upset Told to avoid due to Eliquis   Codeine Nausea And Vomiting    Nitrostat [Nitroglycerin] Other (See Comments)    Bradycardia. Drop in heart rate    Tramadol     Confusion     Home Medications  Prior to Admission medications   Medication Sig Start Date End Date Taking? Authorizing Provider  acetaminophen (TYLENOL) 325 MG tablet Take 2 tablets (650 mg total) by mouth 2 (two) times daily as needed (generalized pain). 08/24/22   Mast, Man X, NP  albuterol (VENTOLIN HFA) 108 (90 Base) MCG/ACT inhaler Inhale 1-2 puffs into the lungs every 4 (four) hours as needed for wheezing or shortness of breath. 03/27/22   Mast, Man X, NP  amLODipine (NORVASC) 5 MG tablet Take 1 tablet (5 mg total) by mouth daily. 01/31/23 05/01/23  [name], MD  ascorbic acid (VITAMIN C) 500 MG tablet Take 1 tablet (500 mg total) by mouth daily. 03/27/22   Mast, Man X, NP  atorvastatin (LIPITOR) 80 MG tablet Take 1 tablet (80 mg total) by mouth daily. 02/08/23   Plotnikov, Georgina Quint, MD  benzonatate (TESSALON) 100 MG capsule Take 1 capsule (100 mg total) by mouth 3 (three) times daily as needed for cough. 12/25/22   McElwee, Jake Church, NP  carvedilol (COREG) 6.25 MG tablet Take 1 tablet (6.25 mg total) by mouth 2 (two) times daily with a meal. 01/11/23   Plotnikov, Georgina Quint, MD  Cholecalciferol (VITAMIN D3) 50 MCG (2000 UT) capsule Take 1 capsule (2,000 Units total) by mouth daily. 03/27/22   Mast, Man X, NP  cloNIDine (CATAPRES - DOSED IN MG/24 HR) 0.2 mg/24hr patch Place 1 patch (0.2 mg total) onto the skin once a week. 01/20/23   Plotnikov, Georgina Quint, MD  cloNIDine (CATAPRES) 0.2 MG tablet Take 1 tablet (0.2 mg total) by mouth 4 (four) times daily as needed for up to 30 doses. For systolic blood pressure (top number) over 200 mmhg 10/19/22   Plotnikov, Georgina Quint, MD  clopidogrel (PLAVIX) 75 MG tablet Take 1 tablet (75 mg total) by mouth every other day. Patient not taking: Reported on 02/13/2023 01/31/23   [name], MD  Cyanocobalamin 2500 MCG TABS Take 2,500 mcg by mouth daily.  08/24/22   Mast, Man X, NP  Fexofenadine HCl (ALLERGY 24-HR PO) Take 1 tablet by mouth in the morning and at bedtime.    [provider]  leflunomide (ARAVA) 20 MG tablet Take 1 tablet (20 mg total) by mouth daily. 08/24/22   Mast, Man X, NP  levothyroxine (SYNTHROID) 88 MCG tablet Take 1 tablet (88 mcg total) by mouth daily before breakfast. 08/24/22   Mast, Man X, NP  methylPREDNISolone (MEDROL DOSEPAK) 4 MG TBPK tablet As directed 02/08/23   Plotnikov, Georgina Quint, MD  Multiple Vitamins-Minerals (CENTRUM ADULT PO) Take 1 tablet by mouth daily. 1 Tablet Daily.    [provider]  pantoprazole (PROTONIX) 40 MG tablet Take 1 tablet (40 mg total) by mouth daily. 08/24/22   Mast, Man X, NP  sertraline (ZOLOFT) 50 MG tablet Take 0.5 tablets (25 mg total) by mouth daily. 12/10/22   Lurene Shadow, MD  torsemide (DEMADEX) 20 MG tablet Take 1 tablet (20 mg total) by mouth See admin instructions. Take 20 mg (1 tablet) by mouth twice a week on Tuesday's and Saturday's. Patient taking differently: Take 20 mg by mouth See admin instructions. Take 20 mg (1 tablet) daily. 02/08/23   Plotnikov, Georgina Quint, MD  triamcinolone (NASACORT) 55 MCG/ACT AERO nasal inhaler Place 1 spray into the nose daily. 08/24/22   Mast, Man X, NP  valACYclovir (VALTREX) 1000 MG tablet Take 1 tablet (1,000 mg total) by mouth 3 (three) times daily. 02/08/23   Plotnikov, Georgina Quint, MD     Critical care time: 83    [facility] 02/16/2023, 7:05 AM  Please see [email or web address] for pager details.  From 7A-7P if no response, please call [phone]. After hours, please call ELink [phone].

## 2023-02-16 NOTE — Progress Notes (Signed)
Afternoon rounds: able to update son at bedside about hospital course and plan of care. Patient continues to be encephalopathic and sleepy. Encouraged lights on etc to prevent concomitant ICU delirium. Off cleviprex. Started PRN labetalol and increased home oral antihypertensives to keep from going back on gtt.

## 2023-02-16 NOTE — H&P (Deleted)
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 Per EDP, patient last known well around 10PM, and husband woke up at 0430 to find patient in chair confused. EMS was called and patient noted to be hypertensive. On arrival to ED SBP 244. EDP noted subtle left facial droop, LUE weakness. CT head without acute bleed. She was given IV labetalol and placed on Cleviprex gtt. Labs notable for Na 131, BUN 90, Cr 4.79, AGMA with AG 18, bicarb 19. BNP 211, trop 34 (delta pending), TSH normal, UA without UTI, UDS negative. Neurology was consulted and recommended MRI which ordered. PCCM was consulted for admission on Cleviprex gtt.  On my exam 10/17, patient is confused. Oriented to self. Left sided facial droop present. Husband is at bedside. He endorses the above story. He does note she fell 2-3 days ago, striking her head. They were not seen for this. States she was a bit confused last night on the phone with her sister, but did not think much of it until this morning. States she is currently being treated for shingles but no other changes to her medications or dosing. Takes her medications as prescribed.   Pertinent  Medical History  CAD s/p CABG x1, DVT/PE, COPD, CKD IV, RA, HTN, HLD  Significant Hospital Events: Including procedures, antibiotic start and stop dates in addition to other pertinent events   10/17 admitted to [facility]. MRI negative.  10/18: continues on cleviprex. Increasing amlodipine to aid off gtt. Continues to be confused/hallucinating.   Interim History / Subjective:  No complaints. She is  only oriented to self.   Objective   Blood pressure (!) 162/60, pulse 92, temperature 98.8 F (37.1 C), temperature source Axillary, resp. rate 18, height 5\' 3"  (1.6 m), weight 53.9 kg, SpO2 96%.        Intake/Output Summary (Last 24 hours) at 02/16/2023 0705 Last data filed at 02/16/2023 0600 Gross per 24 hour  Intake 206 ml  Output 2700 ml  Net -2494 ml   Filed Weights   02/15/23 1139  Weight: 53.9 kg    Examination: General: elderly female laying in bed, somewhat disheveled, confused HENT: mucous membranes moist, PERRLA, EOMI Lungs: clear bilaterally  Cardiovascular: s1/s2 without murmur, rub, gallop Abdomen: soft, non-tender Extremities: no pitting edema Neuro: Left facial droop just at the mouth, mild dysarthria. Oriented to self only. Follows commands. No appreciative focal extremity weakness  GU: foley   Resolved Hospital Problem list     Assessment & Plan:  Hypertensive crisis  Left-sided facial droop Altered mental status; CT and MRI are negative for acute stroke or other acute findings. Continues to be confused and hallucinating. May all be PRES which may take days to improve. Additionally, on chart review appears she was complaining of confusion to PCP for days prior to presentation.  - con't cleviprex goal SBP < 185, DBP < 110  - increasing amlodipine 5->10mg  and coreg 6.25->12.5mg  to help aid off gtt. Continue clonidine,  - trend trop to peak 34>51, EKG without ST-T wave changes.  - neuro consulted by EDP, appreciate recs   Herpes Zoster ; ?  if lower facial droop is partial nerve palsy 2/2 zoster since MRI is negative. Additionally, wondering if component of her confusion is neurotoxicity from supratherapeutic dosing on valtrex outpatient. There is patient messages to PCP regarding confusion after starting Valtrex days prior to presentation. Also has AKI which may be related to Valtrex.  - dc Valtrex  - can consider herpes encephalitis if she continues to not  clear.   Acute kidney injury on Chronic kidney disease IV (baseline Cr ~3.6); likely from hypertensive crisis, but may be related to supratherapeutic Valtrex dosing outpatient causing AKI - trend BMP  - hold home torsemide for now   Hyperlipidemia  CAD s/p CABG x1  - con't home atorvastatin 80mg  daily   Rheumatoid Arthritis  - con't home leflunomide 2-mg daily   Hypothyroidism  - con't home synthroid daily   Hx PE/DVT  - continue home Plavix  - was on Eliquis but d/c due to recurrent GIB.   Goals of care: discussed with husband and patient at bedside. They are currently working on advanced directives at home but agree to DNR/DNI at this time which is in alignment with her wishes.   Best Practice (right click and "Reselect all SmartList Selections" daily)   Diet/type: Regular consistency (see orders) DVT prophylaxis: DOAC GI prophylaxis: PPI Lines: N/A Foley:  Yes, and it is still needed Code Status:  limited Last date of multidisciplinary goals of care discussion [10/17 spoke with patient and husband at bedside. He states they are working on advanced directives. Notes she would not want CPR or intubation]  Labs   CBC: Recent Labs  Lab 02/13/23 1240 02/15/23 0551 02/15/23 0635 02/16/23 0356  WBC  --  9.4  --  10.6*  NEUTROABS  --  7.9*  --   --   HGB 11.9* 12.8 15.0 12.6  HCT  --  39.6 44.0 37.8  MCV  --  100.8*  --  99.0  PLT  --  329  --  285    Basic Metabolic Panel: Recent Labs  Lab 02/15/23 0635 02/15/23 0645 02/16/23 0356  NA 127* 131* 136  K 6.8* 3.9 3.8  CL 100 94* 98  CO2  --  19* 19*  GLUCOSE 159* 163* 110*  BUN 129* 90* 77*  CREATININE 5.10* 4.79* 4.28*  CALCIUM  --  9.8 9.7  MG  --   --  2.2  PHOS  --   --  5.7*   GFR: Estimated Creatinine Clearance: 8.1 mL/min (A) (by C-G formula based on SCr of 4.28 mg/dL (H)). Recent Labs  Lab 02/15/23 0551 02/16/23 0356  WBC 9.4 10.6*    Liver Function Tests: Recent Labs  Lab  02/15/23 0645  AST 24  ALT 17  ALKPHOS 81  BILITOT 0.9  PROT 7.0  ALBUMIN 3.6   No results for input(s): "LIPASE", "AMYLASE" in the last 168 hours. No results for input(s): "AMMONIA" in the last 168 hours.  ABG    Component Value Date/Time   PHART 7.340 (L) 03/03/2015 0355   PCO2ART 40.9 03/03/2015 0355   PO2ART 70.9 (L) 03/03/2015 0355   HCO3 21.5 03/03/2015 0355   TCO2 20 (L) 02/15/2023 0635   ACIDBASEDEF 3.4 (H) 03/03/2015 0355   O2SAT 92.6 03/03/2015 0355     Coagulation Profile: Recent Labs  Lab 02/15/23 0645  INR 1.0    Cardiac Enzymes: No results for input(s): "CKTOTAL", "CKMB", "CKMBINDEX", "TROPONINI" in the last 168 hours.  HbA1C: No results found for: "HGBA1C"  CBG: Recent Labs  Lab 02/15/23 1134 02/15/23 1517 02/15/23 1915 02/15/23 2316 02/16/23 0328  GLUCAP 133* 110* 98 100* 104*    Review of Systems:   Denies complaints  Past Medical History:  She,  has a past medical history of Abdominal aortic aneurysm (HCC), Acquired asplenia, Acute bronchitis (04/03/2016), Acute respiratory failure with hypoxia (HCC) (04/15/2016), Adenomatous colon polyp, Anemia, BRBPR (bright red blood per rectum) (11/17/2021), CAD in native artery (1996, 2002, 2005 ), Cervical disc disease, Chronic kidney disease, COPD (chronic obstructive pulmonary disease) (HCC), Diverticulosis, DVT (deep venous thrombosis) (HCC), History of DVT (deep vein thrombosis) (03/06/2022), History of pulmonary embolus (PE), Hyperlipidemia, Hypertension, Hypothyroidism (acquired), Myocardial infarction (HCC) (11/2014), Polymyalgia rheumatica (HCC), Pulmonary emboli (HCC) (02/06/2022), Rheumatoid arthritis (HCC) (2011), S/P CABG x 1 (1996), Shortness of breath dyspnea, Stroke (HCC) (11/2014), and Urinary frequency.   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 Atretic LIMA. Minimal disease the RCA and Circumflex system.   CARPAL TUNNEL RELEASE Left    CATARACT EXTRACTION Bilateral    CERVICAL SPINE SURGERY     plate 5366 Dr. Wynetta Emery   COLONOSCOPY     COLONOSCOPY WITH PROPOFOL N/A 11/28/2022   Procedure: COLONOSCOPY WITH PROPOFOL;  Surgeon: [name], MD;  Location: [facility] ENDOSCOPY;  Service: Gastroenterology;  Laterality: N/A;   CORONARY ARTERY BYPASS GRAFT  1996   INCLUDED AN INTERNAL MAMMARY ARTERY TO THE LAD. EF WAS NORMAL   ESOPHAGOGASTRODUODENOSCOPY (EGD) WITH PROPOFOL N/A 07/08/2022   Procedure: ESOPHAGOGASTRODUODENOSCOPY (EGD) WITH PROPOFOL;  Surgeon: Benancio Deeds, MD;  Location: [facility] ENDOSCOPY;  Service: Gastroenterology;  Laterality: N/A;   ESOPHAGOGASTRODUODENOSCOPY (EGD) WITH PROPOFOL N/A 11/28/2022   Procedure: ESOPHAGOGASTRODUODENOSCOPY (EGD) WITH PROPOFOL;  Surgeon: [name], MD;  Location[facility] ENDOSCOPY;  Service: Gastroenterology;  Laterality: N/A;   INGUINAL HERNIA REPAIR Right    LAPAROSCOPIC APPENDECTOMY N/A 06/28/2016   Procedure: APPENDECTOMY LAPAROSCOPIC;  Surgeon: [name], MD;  Location: [facility] ORS;  Service: General;  Laterality: N/A;   NM MYOVIEW LTD  06/2010   Fixed anteroseptal, apical and inferoapical defect with moderate size. Most likely scar. Mild subendocardial ischemia. EF 71% LOW RISK.    POLYPECTOMY  11/28/2022   Procedure: POLYPECTOMY;  Surgeon: [name], MD;  Location: [facility] ENDOSCOPY;  Service: Gastroenterology;;   SPLENECTOMY     TOTAL HIP ARTHROPLASTY  Left 01/23/2022   Procedure: LEFT TOTAL HIP ARTHROPLASTY ANTERIOR APPROACH;  Surgeon: [name], MD;  Location: [facility] OR;  Service: Orthopedics;  Laterality: Left;  3-C   TRANSTHORACIC ECHOCARDIOGRAM  12/2014   [facility]: Normal LV size & function. EF 55-60%,    vagina polyp     VIDEO ASSISTED THORACOSCOPY (VATS)/WEDGE RESECTION Left 03/02/2015   Procedure: VIDEO ASSISTED THORACOSCOPY (VATS), MINI THORACOTOMY, LEFT UPPER LOBE WEDGE, TAKE DOWN OF INTERNAL MAMMARY LESIONS, PLACEMENT OF ON-Q PUMP;  Surgeon: [name], MD;  Location: [facility]  OR;  Service: Thoracic;  Laterality: Left;   VIDEO BRONCHOSCOPY N/A 03/02/2015   Procedure: BRONCHOSCOPY;  Surgeon: [name], MD;  Location: [facility] OR;  Service: Thoracic;  Laterality: N/A;   VIDEO BRONCHOSCOPY WITH ENDOBRONCHIAL NAVIGATION N/A 10/08/2017   Procedure: VIDEO BRONCHOSCOPY WITH ENDOBRONCHIAL NAVIGATION WITH BIOPSIES OF LEFT UPPER LOBE AND LEFT LOWER LOBE;  Surgeon: [name], MD;  Location: [facility] OR;  Service: Thoracic;  Laterality: N/A;     Social History:   reports that she quit smoking about 64 years ago. Her smoking use included cigarettes. She has never been exposed to tobacco smoke. She has never used smokeless tobacco. She reports current alcohol use. She reports that she does not use drugs.   Family History:  Her family history includes Cancer in her paternal uncle; Coronary artery disease in an other family member; Diabetes in her mother; Heart disease in her mother; Hyperlipidemia in her mother, sister, son, son, and son; Hypertension in her daughter, mother, sister, son, son, son, and another family member; Stroke in her father. There is no history of Asthma or Colon cancer.   Allergies Allergies  Allergen Reactions   Nsaids Other (See Comments)    Stomach upset Told to avoid due to Eliquis   Aspirin Other (See Comments)    Stomach upset Told to avoid due to Eliquis   Codeine Nausea And Vomiting    Nitrostat [Nitroglycerin] Other (See Comments)    Bradycardia. Drop in heart rate    Tramadol     Confusion     Home Medications  Prior to Admission medications   Medication Sig Start Date End Date Taking? Authorizing Provider  acetaminophen (TYLENOL) 325 MG tablet Take 2 tablets (650 mg total) by mouth 2 (two) times daily as needed (generalized pain). 08/24/22   Mast, Man X, NP  albuterol (VENTOLIN HFA) 108 (90 Base) MCG/ACT inhaler Inhale 1-2 puffs into the lungs every 4 (four) hours as needed for wheezing or shortness of breath. 03/27/22   Mast, Man X, NP  amLODipine (NORVASC) 5 MG tablet Take 1 tablet (5 mg total) by mouth daily. 01/31/23 05/01/23  [name], MD  ascorbic acid (VITAMIN C) 500 MG tablet Take 1 tablet (500 mg total) by mouth daily. 03/27/22   Mast, Man X, NP  atorvastatin (LIPITOR) 80 MG tablet Take 1 tablet (80 mg total) by mouth daily. 02/08/23   Plotnikov, Georgina Quint, MD  benzonatate (TESSALON) 100 MG capsule Take 1 capsule (100 mg total) by mouth 3 (three) times daily as needed for cough. 12/25/22   McElwee, Jake Church, NP  carvedilol (COREG) 6.25 MG tablet Take 1 tablet (6.25 mg total) by mouth 2 (two) times daily with a meal. 01/11/23   Plotnikov, Georgina Quint, MD  Cholecalciferol (VITAMIN D3) 50 MCG (2000 UT) capsule Take 1 capsule (2,000 Units total) by mouth daily. 03/27/22   Mast, Man X, NP  cloNIDine (CATAPRES - DOSED IN MG/24 HR) 0.2 mg/24hr patch Place 1 patch (0.2 mg total) onto the skin once a week. 01/20/23   Plotnikov, Georgina Quint, MD  cloNIDine (CATAPRES) 0.2 MG tablet Take 1 tablet (0.2 mg total) by mouth 4 (four) times daily as needed for up to 30 doses. For systolic blood pressure (top number) over 200 mmhg 10/19/22   Plotnikov, Georgina Quint, MD  clopidogrel (PLAVIX) 75 MG tablet Take 1 tablet (75 mg total) by mouth every other day. Patient not taking: Reported on 02/13/2023 01/31/23   [name], MD  Cyanocobalamin 2500 MCG TABS Take 2,500 mcg by mouth daily.  08/24/22   Mast, Man X, NP  Fexofenadine HCl (ALLERGY 24-HR PO) Take 1 tablet by mouth in the morning and at bedtime.    [provider]  leflunomide (ARAVA) 20 MG tablet Take 1 tablet (20 mg total) by mouth daily. 08/24/22   Mast, Man X, NP  levothyroxine (SYNTHROID) 88 MCG tablet Take 1 tablet (88 mcg total) by mouth daily before breakfast. 08/24/22   Mast, Man X, NP  methylPREDNISolone (MEDROL DOSEPAK) 4 MG TBPK tablet As directed 02/08/23   Plotnikov, Georgina Quint, MD  Multiple Vitamins-Minerals (CENTRUM ADULT PO) Take 1 tablet by mouth daily. 1 Tablet Daily.    [provider]  pantoprazole (PROTONIX) 40 MG tablet Take 1 tablet (40 mg total) by mouth daily. 08/24/22   Mast, Man X, NP  sertraline (ZOLOFT) 50 MG tablet Take 0.5 tablets (25 mg total) by mouth daily. 12/10/22   Lurene Shadow, MD  torsemide (DEMADEX) 20 MG tablet Take 1 tablet (20 mg total) by mouth See admin instructions. Take 20 mg (1 tablet) by mouth twice a week on Tuesday's and Saturday's. Patient taking differently: Take 20 mg by mouth See admin instructions. Take 20 mg (1 tablet) daily. 02/08/23   Plotnikov, Georgina Quint, MD  triamcinolone (NASACORT) 55 MCG/ACT AERO nasal inhaler Place 1 spray into the nose daily. 08/24/22   Mast, Man X, NP  valACYclovir (VALTREX) 1000 MG tablet Take 1 tablet (1,000 mg total) by mouth 3 (three) times daily. 02/08/23   Plotnikov, Georgina Quint, MD     Critical care time: 83    [facility] 02/16/2023, 7:05 AM  Please see [email or web address] for pager details.  From 7A-7P if no response, please call [phone]. After hours, please call ELink [phone].

## 2023-02-16 NOTE — Plan of Care (Signed)
[facility] Tonsillectomy/Adenoidectomy, Postoperative PEDS care plan entered in error.

## 2023-02-16 NOTE — Progress Notes (Signed)
eLink Physician-Brief Progress Note Patient Name: [name] DOB: 1940/10/10 MRN: [id number]   Date of Service  02/16/2023  HPI/Events of Note  BP remains elevated above goal.  eICU Interventions  Increase coreg Add hydralazine prn also     Intervention Category Intermediate Interventions: OtherHenry Russel, Demetrius Charity 02/16/2023, 8:49 PM

## 2023-02-17 DIAGNOSIS — I674 Hypertensive encephalopathy: Secondary | ICD-10-CM | POA: Diagnosis not present

## 2023-02-17 MED ORDER — ENSURE ENLIVE PO LIQD
237.0000 mL | Freq: Two times a day (BID) | ORAL | Status: DC
  Administered 2023-02-21 – 2023-02-26 (×10): 237 mL via ORAL

## 2023-02-17 MED ORDER — HYDRALAZINE HCL 20 MG/ML IJ SOLN: 10.0000 mg | Freq: Four times a day (QID) | INTRAMUSCULAR | Status: DC | PRN

## 2023-02-17 MED ORDER — CARVEDILOL 12.5 MG PO TABS
12.5000 mg | ORAL_TABLET | Freq: Two times a day (BID) | ORAL | Status: DC
  Administered 2023-02-17: 12.5 mg via ORAL
  Filled 2023-02-17: qty 1

## 2023-02-17 NOTE — Progress Notes (Signed)
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 Per EDP, patient last known well around 10PM, and husband woke up at 0430 to find patient in chair confused. EMS was called and patient noted to be hypertensive. On arrival to ED SBP 244. EDP noted subtle left facial droop, LUE weakness. CT head without acute bleed. She was given IV labetalol and placed on Cleviprex gtt. Labs notable for Na 131, BUN 90, Cr 4.79, AGMA with AG 18, bicarb 19. BNP 211, trop 34 (delta pending), TSH normal, UA without UTI, UDS negative. Neurology was consulted and recommended MRI which ordered. PCCM was consulted for admission on Cleviprex gtt.  On my exam 10/17, patient is confused. Oriented to self. Left sided facial droop present. Husband is at bedside. He endorses the above story. He does note she fell 2-3 days ago, striking her head. They were not seen for this. States she was a bit confused last night on the phone with her sister, but did not think much of it until this morning. States she is currently being treated for shingles but no other changes to her medications or dosing. Takes her medications as prescribed.   Pertinent  Medical History  CAD s/p CABG x1, DVT/PE, COPD, CKD IV, RA, HTN, HLD  Significant Hospital Events: Including procedures, antibiotic start and stop dates in addition to other pertinent events   10/17 admitted to [facility]. MRI negative.  10/18: continues on cleviprex. Increasing amlodipine to aid off gtt. Continues to be confused/hallucinating.   Interim History / Subjective:  Complains of left neck  pain.  Objective   Blood pressure (!) 162/60, pulse 92, temperature 98.8 F (37.1 C), temperature source Axillary, resp. rate 18, height 5\' 3"  (1.6 m), weight 53.9 kg, SpO2 96%.        Intake/Output Summary (Last 24 hours) at 02/16/2023 0705 Last data filed at 02/16/2023 0600 Gross per 24 hour  Intake 206 ml  Output 2700 ml  Net -2494 ml   Filed Weights   02/15/23 1139  Weight: 53.9 kg    Examination: General: elderly female laying in bed, somewhat disheveled, confused HENT: mucous membranes moist, PERRLA, EOMI.  Patient keeping turn to the right.  Tenderness to palpation of trapezii.  No vesicles noted. Lungs: clear bilaterally  Cardiovascular: s1/s2 without murmur, rub, gallop Abdomen: soft, non-tender Extremities: no pitting edema. Neuro: Left facial droop just at the mouth, mild dysarthria. Oriented to self only. Follows commands. No appreciative focal extremity weakness  GU: foley   Ancillary tests personally reviewed  Creatinine slowly decreasing now 4.28.  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 Elissa Hefty note. Left facial droop likely from herpes zoster, shingles, resolving Acute kidney injury on CKD stage IV Coronary artery disease status post CABG Hyperlipidemia Rheumatoid arthritis DVT/PE by history but no anticoagulation due to history of GI bleeding. Torticollis   Plan:   Patient is  now off Cleviprex - can transfer out of ICU. Hospitalist notified.  Will add oral hydralazine. Goal blood pressure per Dr. Elissa Hefty notes would be around 150 systolic.  Ultimately, would like to be able to wean clonidine off if possible given its sedating effects. Mental status continues to improve  and would likely return to normal. Continue to monitor creatinine.  Do not feel uremia is significant driver of confusion but may be a secondary contributing factor. Continue leflunomide Continue Plavix Soft collar for comfort.  Analgesic.   Goals of care: discussed with husband and patient at bedside. They are currently working on advanced directives at home but agree to DNR/DNI at this time which is in alignment with her wishes.   Best Practice (right click and "Reselect all SmartList Selections" daily)   Diet/type: Regular consistency (see orders) DVT prophylaxis: Heparin subcu GI prophylaxis: PPI Lines: N/A Foley: Discontinue Foley Code Status:  limited Last date of multidisciplinary goals of care discussion [10/17 spoke with patient and husband at bedside. He states they are working on advanced directives. Notes she would not want CPR or intubation]  Lynnell Catalan, MD [facility] ICU Physician [facility] Critical Care  Pager: [phone] Or Epic Secure Chat After hours: [phone].  02/17/2023, 10:35 AM

## 2023-02-18 ENCOUNTER — Inpatient Hospital Stay (HOSPITAL_COMMUNITY): Payer: Medicare PPO

## 2023-02-18 DIAGNOSIS — I160 Hypertensive urgency: Secondary | ICD-10-CM | POA: Diagnosis not present

## 2023-02-18 LAB — BASIC METABOLIC PANEL
Anion gap: 17 — ABNORMAL HIGH (ref 5–15)
BUN: 79 mg/dL — ABNORMAL HIGH (ref 8–23)
CO2: 19 mmol/L — ABNORMAL LOW (ref 22–32)
Calcium: 9.2 mg/dL (ref 8.9–10.3)
Chloride: 95 mmol/L — ABNORMAL LOW (ref 98–111)
Creatinine, Ser: 5.01 mg/dL — ABNORMAL HIGH (ref 0.44–1.00)
GFR, Estimated: 8 mL/min — ABNORMAL LOW (ref >60)
Glucose, Bld: 107 mg/dL — ABNORMAL HIGH (ref 70–99)
Potassium: 3.6 mmol/L (ref 3.5–5.1)
Sodium: 131 mmol/L — ABNORMAL LOW (ref 135–145)

## 2023-02-18 MED ORDER — AMLODIPINE BESYLATE 5 MG PO TABS
5.0000 mg | ORAL_TABLET | Freq: Every day | ORAL | Status: DC
  Administered 2023-02-18 – 2023-02-20 (×2): 5 mg via ORAL
  Filled 2023-02-18 (×3): qty 1

## 2023-02-18 MED ORDER — CARVEDILOL 6.25 MG PO TABS
6.2500 mg | ORAL_TABLET | Freq: Two times a day (BID) | ORAL | Status: DC
  Administered 2023-02-19 – 2023-02-20 (×3): 6.25 mg via ORAL
  Filled 2023-02-18 (×4): qty 1

## 2023-02-18 MED ORDER — DEXTROSE-SODIUM CHLORIDE 5-0.9 % IV SOLN: INTRAVENOUS | Status: DC

## 2023-02-18 MED ORDER — DEXTROSE 5 % AND 0.9 % NACL IV BOLUS
250.0000 mL | Freq: Once | INTRAVENOUS | Status: AC
  Administered 2023-02-18: 250 mL via INTRAVENOUS

## 2023-02-18 MED ORDER — HYDRALAZINE HCL 20 MG/ML IJ SOLN
5.0000 mg | Freq: Four times a day (QID) | INTRAMUSCULAR | Status: DC | PRN
  Administered 2023-02-21: 5 mg via INTRAVENOUS
  Filled 2023-02-18: qty 1

## 2023-02-18 MED ORDER — ACETAMINOPHEN 500 MG PO TABS
500.0000 mg | ORAL_TABLET | Freq: Three times a day (TID) | ORAL | Status: DC
  Administered 2023-02-18 – 2023-02-26 (×23): 500 mg via ORAL
  Filled 2023-02-18 (×25): qty 1

## 2023-02-18 MED ORDER — SORBITOL 70 % SOLN
15.0000 mL | Freq: Once | Status: AC
  Administered 2023-02-18: 15 mL via ORAL
  Filled 2023-02-18: qty 30

## 2023-02-18 MED ORDER — CARVEDILOL 6.25 MG PO TABS: 6.2500 mg | ORAL_TABLET | Freq: Two times a day (BID) | ORAL | Status: DC

## 2023-02-18 MED ORDER — CARVEDILOL 12.5 MG PO TABS
12.5000 mg | ORAL_TABLET | Freq: Two times a day (BID) | ORAL | Status: DC
  Administered 2023-02-18: 12.5 mg via ORAL
  Filled 2023-02-18: qty 1

## 2023-02-18 NOTE — Progress Notes (Signed)
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  Last known well around 10 PM the night prior to admission.  She wake up at 430 the day of admission and was noted to be confused.  She was noted to be hypertensive.  Systolic blood pressure 244.  She was noted to have subtle left facial droop, left upper extremity weakness.  CT head no acute finding.  She received IV labetalol and was placed on Cleviprex drip.  Neurology was consulted. Patient was transition to oral medication, care transferred to triad 10/20.    Assessment & Plan:   Principal Problem:   Hypertensive urgency   1-Hypertensive emergency with hypertensive encephalopathy: -Blood pressure goal per Dr. Ollen Bowl notes will be around 150, due to patient history of orthostatic hypotension -MRI negative for acute findings -Received Cevipres in the ICU.  -EEG mild to moderate diffuse encephalopathy.  No seizures or epileptiform discharges were seen -BP fluctuates, has had lows BP. Will adjust medications again. Will reduce Norvasc to 5 mg, reduce Carvedilol to 6. 25. Continue with PRN hydralazine.  -Continue with Clonidine patch.  Continue to monitor BP>   Left facial droop likely from herpes zoster shingles resolving -Resolving.  -Schedule tylenol for pain.   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 Now also hypotension.  -Avoid hypotension.  Will give IV bolus systolic blood pressure dropped this  afternoon to 90s, continue low rate IV fluids -Strict I's and O's -Repeat renal function in the morning -Renal ultrasound: No obstructive uropathy.  Renal parenchymal atrophy and finding of chronic medical renal disease  CAD status post CABG -Continue with Lipitor, carvedilol Plavix  Hyperlipidemia -Continue with Lipitor  Constipation: Will give a dose of sorbitol   Rheumatoid arthritis History of DVT and PE but not on anticoagulation due to history of recurrent GI bleed    Estimated body mass index is 21.71 kg/m as calculated from the following:   Height as of this encounter: 5\' 3"  (1.6 m).   Weight as of this encounter: 55.6 kg.   DVT prophylaxis: Heparin Code Status: DNR Family Communication: Son who was at bedside Disposition Plan:  Status is: Inpatient Remains inpatient appropriate because: Management of hypertension and AKI    Consultants:  CCM admitted patient  Procedures:  Renal ultrasound  Antimicrobials:    Subjective: She is alert, she reports bilateral lower extremity pain and contraction.  Has not had a bowel movement since admission.  Objective: Vitals:   02/17/23 2037 02/17/23 2039 02/18/23 0454 02/18/23 0500  BP: (!) 147/57  (!) 163/64   Pulse: 87  95   Resp: 16  16   Temp: 98 F (36.7 C)  97.8 F (36.6 C)   TempSrc:      SpO2: 93%  93%   Weight:  55.3 kg  55.6 kg  Height:  5\' 3"  (1.6 m)      Intake/Output Summary (Last 24 hours) at 02/18/2023 0757 Last data filed at 02/18/2023 0545 Gross per 24 hour  Intake  100 ml  Output 400 ml  Net -300 ml   Filed Weights   02/16/23 0701 02/17/23 2039 02/18/23 0500  Weight: 52.9 kg 55.3 kg 55.6 kg    Examination:  General exam: Appears calm and comfortable  Respiratory system: Clear to auscultation. Respiratory effort normal. Cardiovascular system: S1 & S2 heard, RRR.  Gastrointestinal system: Abdomen is nondistended, soft and nontender. No organomegaly or masses felt. Normal bowel  sounds heard. Central nervous system: Alert and oriented.  Extremities: BL weakness, contraction.    Data Reviewed: I have personally reviewed following labs and imaging studies  CBC: Recent Labs  Lab 02/13/23 1240 02/15/23 0551 02/15/23 0635 02/16/23 0356  WBC  --  9.4  --  10.6*  NEUTROABS  --  7.9*  --   --   HGB 11.9* 12.8 15.0 12.6  HCT  --  39.6 44.0 37.8  MCV  --  100.8*  --  99.0  PLT  --  329  --  285   Basic Metabolic Panel: Recent Labs  Lab 02/15/23 0635 02/15/23 0645 02/16/23 0356 02/18/23 0413  NA 127* 131* 136 131*  K 6.8* 3.9 3.8 3.6  CL 100 94* 98 95*  CO2  --  19* 19* 19*  GLUCOSE 159* 163* 110* 107*  BUN 129* 90* 77* 79*  CREATININE 5.10* 4.79* 4.28* 5.01*  CALCIUM  --  9.8 9.7 9.2  MG  --   --  2.2  --   PHOS  --   --  5.7*  --    GFR: Estimated Creatinine Clearance: 6.9 mL/min (A) (by C-G formula based on SCr of 5.01 mg/dL (H)). Liver Function Tests: Recent Labs  Lab 02/15/23 0645  AST 24  ALT 17  ALKPHOS 81  BILITOT 0.9  PROT 7.0  ALBUMIN 3.6   No results for input(s): "LIPASE", "AMYLASE" in the last 168 hours. No results for input(s): "AMMONIA" in the last 168 hours. Coagulation Profile: Recent Labs  Lab 02/15/23 0645  INR 1.0   Cardiac Enzymes: No results for input(s): "CKTOTAL", "CKMB", "CKMBINDEX", "TROPONINI" in the last 168 hours. BNP (last 3 results) No results for input(s): "PROBNP" in the last 8760 hours. HbA1C: No results for input(s): "HGBA1C" in the last 72 hours. CBG: Recent Labs  Lab 02/15/23 1517 02/15/23 1915 02/15/23 2316 02/16/23 0328 02/16/23 0720  GLUCAP 110* 98 100* 104* 117*   Lipid Profile: Recent Labs    02/16/23 0356  TRIG 163*   Thyroid Function Tests: No results for input(s): "TSH", "T4TOTAL", "FREET4", "T3FREE", "THYROIDAB" in the last 72 hours. Anemia Panel: No results for input(s): "VITAMINB12", "FOLATE", "FERRITIN", "TIBC", "IRON", "RETICCTPCT" in the last 72 hours. Sepsis  Labs: No results for input(s): "PROCALCITON", "LATICACIDVEN" in the last 168 hours.  Recent Results (from the past 240 hour(s))  MRSA Next Gen by PCR, Nasal     Status: None   Collection Time: 02/15/23 11:33 AM   Specimen: Nasal Mucosa; Nasal Swab  Result Value Ref Range Status   MRSA by PCR Next Gen NOT DETECTED NOT DETECTED Final    Comment: (NOTE) The GeneXpert MRSA Assay (FDA approved for NASAL specimens only), is one component of a comprehensive MRSA colonization surveillance program. It is not intended to diagnose MRSA infection nor to guide or monitor treatment for MRSA infections. Test performance is not FDA approved in patients less than 69 years old. Performed at [facility], [address] [place], [place] [address]  Radiology Studies: No results found.      Scheduled Meds:  amLODipine  10 mg Oral Daily   ascorbic acid  500 mg Oral Daily   atorvastatin  80 mg Oral Daily   carvedilol  12.5 mg Oral BID WC   Chlorhexidine Gluconate Cloth  6 each Topical Daily   cholecalciferol  2,000 Units Oral Daily   cloNIDine  0.2 mg Transdermal Weekly   clopidogrel  75 mg Oral QODAY   cyanocobalamin  2,500 mcg Oral Daily   feeding supplement  237 mL Oral BID BM   heparin  5,000 Units Subcutaneous Q8H   leflunomide  20 mg Oral Daily   levothyroxine  88 mcg Oral QAC breakfast   loratadine  10 mg Oral Daily   multivitamin with minerals  1 tablet Oral Daily   pantoprazole  40 mg Oral Daily   sertraline  25 mg Oral Daily   triamcinolone  1 spray Nasal Daily   Continuous Infusions:   LOS: 3 days    Time spent: 35 minutes.     Alba Cory, MD [facility]   If 7PM-7AM, please contact night-coverage [email or web address]  02/18/2023, 7:57 AM

## 2023-02-18 NOTE — Plan of Care (Signed)

## 2023-02-18 NOTE — Plan of Care (Signed)
  Problem: Health Behavior/Discharge Planning: Goal: Ability to manage health-related needs will improve Outcome: Progressing   

## 2023-02-18 NOTE — Evaluation (Signed)
Physical Therapy Evaluation Patient Details Name: [name] MRN: [id number] DOB: 03-04-1941 Today's Date: 02/18/2023  History of Present Illness  82 y.o. female presents to [facility] hospital on 10/17 with altered mental status with associated hypertensive crisis. AKI on CKD stage IV. MI negative for acute findings, found to have herpes zoster shingles incidentally PMH includes CKD IV, HTN, RA, hypothyroidism, CABG, COPD, HLD, PE.  Clinical Impression  PTA pt living with husband in single story ILF at [facility]. Pt reports independence with ambulation in the home, using a Rollator for limited community ambulation for seating when tired. Pt reports independence in ADLs and iADLs. Pt currently limited in safe mobility by variable BP, R knee pain and swelling which like was caused by fall prior to hospitalization, decreased strength and increased fatigue with mobilization. Pt is currently modA to get to EoB, and total A to return to bed, able to scoot along EoB with min A. PT recommending [facility] for rehabilitation prior to discharge home. PT will continue to follow acutely.   Orthostatic BPs                                                            BP                   HR Supine 109/38 (60) 70  Sitting 84/73 (79) 74  Sitting after 3 min 92/49 (61) 76  Return to supine 119/49 (67)  76         If plan is discharge home, recommend the following: A lot of help with walking and/or transfers;A lot of help with bathing/dressing/bathroom;Assistance with cooking/housework;Direct supervision/assist for medications management;Direct supervision/assist for financial management;Assist for transportation;Help with stairs or ramp for entrance   Can travel by private vehicle   No    Equipment Recommendations None recommended by PT  Recommendations for Other Services    OT Consult   Functional Status Assessment Patient has had a recent decline in their functional status and demonstrates  the ability to make significant improvements in function in a reasonable and predictable amount of time.     Precautions / Restrictions Precautions Precautions: Fall Precaution Comments: fall prior to hoptialization, hit her head      Mobility  Bed Mobility Overal bed mobility: Needs Assistance Bed Mobility: Supine to Sit, Sit to Supine     Supine to sit: Mod assist Sit to supine: Total assist   General bed mobility comments: with HoB elevated and vc for sequencing requires modA for bringin LE off bed and bringing trunk to upright, requires total A for bringing LE back into bed    Transfers Overall transfer level: Needs assistance   Transfers: Bed to chair/wheelchair/BSC            Lateral/Scoot Transfers: Min assist General transfer comment: min A with use of bed pad for pt to laterally scoot towards HoB        Balance Overall balance assessment: Needs assistance Sitting-balance support: Bilateral upper extremity supported, Feet supported Sitting balance-Leahy Scale: Poor Sitting balance - Comments: initially firm grasp on bed rail to keep balance able to progress to both UE supporting at hips while sitting EoB  Pertinent Vitals/Pain Pain Assessment Pain Assessment: No/denies pain    Home Living Family/patient expects to be discharged to:: Other (Comment)                   Additional Comments: ILF at [facility]    Prior Function Prior Level of Function : Independent/Modified Independent             Mobility Comments: ambulatory with SPC vs rollator ADLs Comments: independent with ADLs     Extremity/Trunk Assessment   Upper Extremity Assessment Upper Extremity Assessment: Defer to OT evaluation    Lower Extremity Assessment Lower Extremity Assessment: RLE deficits/detail;Generalized weakness RLE Deficits / Details: R knee visually swollen, ROM in hip and knee limited by pain, ankle  dorsi/plantar flexion WNL RLE Sensation: WNL RLE Coordination: decreased fine motor    Cervical / Trunk Assessment Cervical / Trunk Assessment: Kyphotic  Communication   Communication Communication: No apparent difficulties  Cognition Arousal: Alert Behavior During Therapy: Flat affect Overall Cognitive Status: Within Functional Limits for tasks assessed                                          General Comments General comments (skin integrity, edema, etc.): husband present during session        Assessment/Plan    PT Assessment Patient needs continued PT services  PT Problem List Decreased strength;Decreased range of motion;Decreased activity tolerance;Decreased balance;Decreased mobility;Cardiopulmonary status limiting activity;Pain       PT Treatment Interventions DME instruction;Gait training;Functional mobility training;Therapeutic activities;Therapeutic exercise;Balance training;Patient/family education    PT Goals (Current goals can be found in the Care Plan section)  Acute Rehab PT Goals PT Goal Formulation: With patient/family Time For Goal Achievement: 03/04/23 Potential to Achieve Goals: Fair    Frequency Min 1X/week        AM-PAC PT "6 Clicks" Mobility  Outcome Measure Help needed turning from your back to your side while in a flat bed without using bedrails?: A Little Help needed moving from lying on your back to sitting on the side of a flat bed without using bedrails?: A Lot Help needed moving to and from a bed to a chair (including a wheelchair)?: Total Help needed standing up from a chair using your arms (e.g., wheelchair or bedside chair)?: Total Help needed to walk in hospital room?: Total Help needed climbing 3-5 steps with a railing? : Total 6 Click Score: 9    End of Session Equipment Utilized During Treatment: Gait belt Activity Tolerance: Patient limited by fatigue Patient left: in bed;with call bell/phone within  reach;with bed alarm set;with family/visitor present Nurse Communication: Mobility status;Other (comment) (irratic BP) PT Visit Diagnosis: History of falling (Z91.81);Muscle weakness (generalized) (M62.81);Difficulty in walking, not elsewhere classified (R26.2);Other symptoms and signs involving the nervous system (R29.898);Pain Pain - Right/Left: Right Pain - part of body: Knee    Time: 1610-9604 PT Time Calculation (min) (ACUTE ONLY): 32 min   Charges:   PT Evaluation $PT Eval Moderate Complexity: 1 Mod PT Treatments $Therapeutic Activity: 8-22 mins PT General Charges $$ ACUTE PT VISIT: 1 Visit         Muhamad Serano B. Beverely Risen PT, DPT Acute Rehabilitation Services Please use secure chat or  Call Office [phone]   Elon Alas [facility] 02/18/2023, 5:32 PM

## 2023-02-19 ENCOUNTER — Inpatient Hospital Stay (HOSPITAL_COMMUNITY): Payer: Medicare PPO

## 2023-02-19 DIAGNOSIS — I160 Hypertensive urgency: Secondary | ICD-10-CM | POA: Diagnosis not present

## 2023-02-19 LAB — BASIC METABOLIC PANEL
Anion gap: 15 (ref 5–15)
BUN: 82 mg/dL — ABNORMAL HIGH (ref 8–23)
CO2: 19 mmol/L — ABNORMAL LOW (ref 22–32)
Calcium: 8.5 mg/dL — ABNORMAL LOW (ref 8.9–10.3)
Chloride: 95 mmol/L — ABNORMAL LOW (ref 98–111)
Creatinine, Ser: 5.09 mg/dL — ABNORMAL HIGH (ref 0.44–1.00)
GFR, Estimated: 8 mL/min — ABNORMAL LOW (ref >60)
Glucose, Bld: 108 mg/dL — ABNORMAL HIGH (ref 70–99)
Potassium: 3.7 mmol/L (ref 3.5–5.1)
Sodium: 129 mmol/L — ABNORMAL LOW (ref 135–145)

## 2023-02-19 MED ORDER — BUPIVACAINE HCL (PF) 0.5 % IJ SOLN
10.0000 mL | Freq: Once | INTRAMUSCULAR | Status: AC
  Administered 2023-02-20: 10 mL
  Filled 2023-02-19 (×2): qty 10

## 2023-02-19 MED ORDER — SORBITOL 70 % SOLN
30.0000 mL | Freq: Once | Status: AC
  Administered 2023-02-19: 30 mL via ORAL
  Filled 2023-02-19: qty 30

## 2023-02-19 MED ORDER — METHYLPREDNISOLONE ACETATE 40 MG/ML IJ SUSP
40.0000 mg | Freq: Once | INTRAMUSCULAR | Status: AC
  Administered 2023-02-20: 40 mg via INTRA_ARTICULAR
  Filled 2023-02-19 (×2): qty 1

## 2023-02-19 MED ORDER — GABAPENTIN 100 MG PO CAPS: 100.0000 mg | ORAL_CAPSULE | Freq: Every day | ORAL | Status: DC

## 2023-02-19 NOTE — Progress Notes (Addendum)
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  Last known well around 10 PM the night prior to admission.  She wake up at 430 the day of admission and was noted to be confused.  She was noted to be hypertensive.  Systolic blood pressure 244.  She was noted to have subtle left facial droop, left upper extremity weakness.  CT head no acute finding.  She received IV labetalol and was placed on Cleviprex drip.  Neurology was consulted. Patient was transition to oral medication, care transferred to triad 10/20.    Assessment & Plan:   Principal Problem:   Hypertensive urgency   1-Hypertensive emergency with hypertensive encephalopathy: -Blood pressure goal per Dr. Ollen Bowl notes will be around 150, due to patient history of orthostatic hypotension -MRI negative for acute findings -Received Cevipres in the ICU.  -EEG mild to moderate diffuse encephalopathy.  No seizures or epileptiform discharges were seen -Had Low BP. Norvasc and coreg dose reduce to 5 mg and 6.25. -Continue with Clonidine patch.  Continue to monitor BP>   Left facial droop likely from herpes zoster shingles resolving -Resolving.  -Schedule tylenol for pain.   Right knee pain, effusion.  X ray large effusion, suspicious for tibia fracture.  CT order Ortho consulted.   Hyponatremia;  In setting AKI. Monitor.   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 Now also hypotension.  -Avoid  hypotension.  Received IV fluids.  -Strict I's and O's -Renal ultrasound: No obstructive uropathy.  Renal parenchymal atrophy and finding of chronic medical renal disease Cr at 5.  Nephrology consulted.   CAD status post CABG -Continue with Lipitor, carvedilol Plavix  Hyperlipidemia -Continue with Lipitor  Constipation: Will repeat Sorbitol dose.    Rheumatoid arthritis History of DVT and PE but not on anticoagulation due to history of recurrent GI bleed    Estimated body mass index is 22.81 kg/m as calculated from the following:   Height as of this encounter: 5\' 3"  (1.6 m).   Weight as of this encounter: 58.4 kg.   DVT prophylaxis: Heparin Code Status: DNR Family Communication: Husband  who was at bedside Disposition Plan:  Status is: Inpatient Remains inpatient appropriate because: Management of hypertension and AKI    Consultants:  CCM admitted patient  Procedures:  Renal ultrasound  Antimicrobials:    Subjective: She is more alert today, she is on the phone making phone call. She report right knee pain.  No BM yet.    Objective: Vitals:   02/19/23 0516 02/19/23 0733 02/19/23 0748 02/19/23 0936  BP: (!) 155/55  (!) 173/75 (!) 114/54  Pulse: 93  91 84  Resp: 18  18 20   Temp: 97.9 F (36.6 C)  98.2 F (36.8 C) 98 F (36.7 C)  TempSrc:   Oral   SpO2: 92%  92% 93%  Weight:  58.4 kg    Height:        Intake/Output Summary (Last 24 hours) at 02/19/2023 1327 Last data filed at 02/19/2023  1224 Gross per 24 hour  Intake 957.69 ml  Output 375 ml  Net 582.69 ml   Filed Weights   02/17/23 2039 02/18/23 0500 02/19/23 0733  Weight: 55.3 kg 55.6 kg 58.4 kg    Examination:  General exam: NAD Respiratory system: CTA Cardiovascular system: S 1, S 2 RRR Gastrointestinal system: BS present, soft, nt. Central nervous system: alert Extremities: BL weakness, contraction.    Data Reviewed: I have personally reviewed following labs and imaging  studies  CBC: Recent Labs  Lab 02/13/23 1240 02/15/23 0551 02/15/23 0635 02/16/23 0356  WBC  --  9.4  --  10.6*  NEUTROABS  --  7.9*  --   --   HGB 11.9* 12.8 15.0 12.6  HCT  --  39.6 44.0 37.8  MCV  --  100.8*  --  99.0  PLT  --  329  --  285   Basic Metabolic Panel: Recent Labs  Lab 02/15/23 0635 02/15/23 0645 02/16/23 0356 02/18/23 0413 02/19/23 0508  NA 127* 131* 136 131* 129*  K 6.8* 3.9 3.8 3.6 3.7  CL 100 94* 98 95* 95*  CO2  --  19* 19* 19* 19*  GLUCOSE 159* 163* 110* 107* 108*  BUN 129* 90* 77* 79* 82*  CREATININE 5.10* 4.79* 4.28* 5.01* 5.09*  CALCIUM  --  9.8 9.7 9.2 8.5*  MG  --   --  2.2  --   --   PHOS  --   --  5.7*  --   --    GFR: Estimated Creatinine Clearance: 6.8 mL/min (A) (by C-G formula based on SCr of 5.09 mg/dL (H)). Liver Function Tests: Recent Labs  Lab 02/15/23 0645  AST 24  ALT 17  ALKPHOS 81  BILITOT 0.9  PROT 7.0  ALBUMIN 3.6   No results for input(s): "LIPASE", "AMYLASE" in the last 168 hours. No results for input(s): "AMMONIA" in the last 168 hours. Coagulation Profile: Recent Labs  Lab 02/15/23 0645  INR 1.0   Cardiac Enzymes: No results for input(s): "CKTOTAL", "CKMB", "CKMBINDEX", "TROPONINI" in the last 168 hours. BNP (last 3 results) No results for input(s): "PROBNP" in the last 8760 hours. HbA1C: No results for input(s): "HGBA1C" in the last 72 hours. CBG: Recent Labs  Lab 02/15/23 1517 02/15/23 1915 02/15/23 2316 02/16/23 0328 02/16/23 0720  GLUCAP 110* 98 100* 104* 117*   Lipid Profile: No results for input(s): "CHOL", "HDL", "LDLCALC", "TRIG", "CHOLHDL", "LDLDIRECT" in the last 72 hours.  Thyroid Function Tests: No results for input(s): "TSH", "T4TOTAL", "FREET4", "T3FREE", "THYROIDAB" in the last 72 hours. Anemia Panel: No results for input(s): "VITAMINB12", "FOLATE", "FERRITIN", "TIBC", "IRON", "RETICCTPCT" in the last 72 hours. Sepsis Labs: No results for input(s): "PROCALCITON",  "LATICACIDVEN" in the last 168 hours.  Recent Results (from the past 240 hour(s))  MRSA Next Gen by PCR, Nasal     Status: None   Collection Time: 02/15/23 11:33 AM   Specimen: Nasal Mucosa; Nasal Swab  Result Value Ref Range Status   MRSA by PCR Next Gen NOT DETECTED NOT DETECTED Final    Comment: (NOTE) The GeneXpert MRSA Assay (FDA approved for NASAL specimens only), is one component of a comprehensive MRSA colonization surveillance program. It is not intended to diagnose MRSA infection nor to guide or monitor treatment for MRSA infections. Test performance is not FDA approved in patients less than 34 years old. Performed at [facility], [address] [place], [place] [address]  Radiology Studies: DG Knee 1-2 Views Right  Result Date: 02/18/2023 CLINICAL DATA:  Right knee pain and swelling following fall, initial encounter EXAM: RIGHT KNEE - 2 VIEW COMPARISON:  08/02/2022 FINDINGS: Tricompartmental degenerative changes are noted. There is suggestion of mildly displaced fracture through the intercondylar eminence of the proximal tibia best seen on the lateral projection. Cross-sectional imaging may be helpful for further evaluation. Large suprapatellar joint effusion is seen. IMPRESSION: Large joint effusion with findings suspicious for proximal tibial fracture. CT may be helpful when clinically able. Electronically Signed   By: Alcide Clever M.D.   On: 02/18/2023 22:12   US RENAL  Result Date: 02/18/2023 CLINICAL DATA:  Acute kidney injury. EXAM: RENAL / URINARY TRACT ULTRASOUND COMPLETE COMPARISON:  CT 12/08/2022, renal ultrasound 07/11/2022 FINDINGS: Right Kidney: Renal measurements: 8.1 x 4 x 3.3 cm = volume: 55 mL. Renal parenchymal atrophy and increased echogenicity. Simple cyst measures 3.6 cm. Additional smaller cysts. No hydronephrosis. Left Kidney: Renal measurements: 7.2 x 3.3 x 3.6 cm = volume: 44 mL. Renal parenchymal atrophy and increased echogenicity.  There are multiple simple cysts, which were better appreciated on prior CT. No hydronephrosis. Bladder: Only minimally distended. Appears normal for degree of bladder distention. Other: None. IMPRESSION: 1. No obstructive uropathy. 2. Renal parenchymal atrophy and findings of chronic medical renal disease. Electronically Signed   By: Narda Rutherford M.D.   On: 02/18/2023 14:02        Scheduled Meds:  acetaminophen  500 mg Oral TID   amLODipine  5 mg Oral Daily   ascorbic acid  500 mg Oral Daily   atorvastatin  80 mg Oral Daily   carvedilol  6.25 mg Oral BID WC   Chlorhexidine Gluconate Cloth  6 each Topical Daily   cholecalciferol  2,000 Units Oral Daily   cloNIDine  0.2 mg Transdermal Weekly   clopidogrel  75 mg Oral QODAY   cyanocobalamin  2,500 mcg Oral Daily   feeding supplement  237 mL Oral BID BM   heparin  5,000 Units Subcutaneous Q8H   leflunomide  20 mg Oral Daily   levothyroxine  88 mcg Oral QAC breakfast   loratadine  10 mg Oral Daily   multivitamin with minerals  1 tablet Oral Daily   pantoprazole  40 mg Oral Daily   sertraline  25 mg Oral Daily   sorbitol  30 mL Oral Once   triamcinolone  1 spray Nasal Daily   Continuous Infusions:   LOS: 4 days    Time spent: 35 minutes.     Alba Cory, MD [facility]   If 7PM-7AM, please contact night-coverage [email or web address]  02/19/2023, 1:27 PM

## 2023-02-19 NOTE — Consult Note (Signed)
Reason for Consult: Renal failure Referring Physician: Dr. Lauraine Rinne  Chief Complaint: Altered mental status  Assessment/Plan: Renal failure with baseline advanced CKDIV BL 3.2-3.7 range followed by Dr. Vallery Sa with [facility].  BP dropped as low as 94/41 on 10/19, 90/32 on 10/20 but otherwise running in the 159-170's systolic range. Initially renal function was improving but then trended up which is likely related to the hypotensive episodes with systolics dropping at times to low 90's.  Urine protein >300. Weight has decr from 59kg to 54kg. UOP 2.7L /24hrs earlier in the hospitalzation but has dropped to oliguric range. Renal ultrasound shows small kidneys with low volumes, incr echogenicity but no e/o obstruction. 114/54 morning of consultation. - antihypertensives being titrated to hopefully prevent episodes of hypotension. Currently on Amlodipine 5mg  daily, carvedilol 6.25 BID, Catapres patch 0.2 on Thursdays, hydralazine PRN - agree with management and holding the ARB with AKI.  - will follow closely with you; even systolics in the 140-160's is fine and we can slowly titrate up on her antihypertensives as needed as an outpt.  - Avoid nephrotoxic medications including NSAIDs and iodinated intravenous contrast exposure unless the latter is absolutely indicated.   - Preferred narcotic agents for pain control are hydromorphone, fentanyl, and methadone. Morphine should not be used.  - Avoid Baclofen and avoid oral sodium phosphate and magnesium citrate based laxatives / bowel preps.  - Continue strict Input and Output monitoring. Will monitor the patient closely with you and intervene or adjust therapy as indicated by changes in clinical status/labs   AMS - improved; spouse bedside.  Hypertensive crisis initially treated with Labetolol and Cleviprex gtt initially now being managed with Catapres patch 0.2, amlodipine 5 and carvedilol 6.25 BID with prn hydralazine. Herpes zoster tx valcyte CASHD s/p  CABG - no chest pain or signs of ischemia. RA   HPI: [name] is an 82 y.o. 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 Found to be hypertensive with SBP  244 by EMS and left facial droop and LUE weakness. CT negative for bleed and the patient was given IV labetalol and started on a Cleviprex gtt in the ED. BUN/Cr was 90/4.8 in the ED with a negative UDS. She did have a fall a few days prior to admission with head trauma but again CT neg for bleed.  Cr  had decr from 5.1 -> nadir of 4.28 during this hospitalization before increasing to 5.09 but her baseline cr appears to be in the 3.2-3.7 range followed by Dr. Vallery Sa with [facility].  ROS Pertinent items are noted in HPI.  Chemistry and CBC: Creatinine  Date/Time Value Ref Range Status  08/01/2022 08:00 AM 3.3 (A) 0.5 - 1.1 Final  02/14/2022 12:00 AM 2.1 (A) 0.5 - 1.1 Final   Creat  Date/Time Value Ref Range Status  12/26/2019 10:05 AM 2.19 (H) 0.60 - 0.88 mg/dL Final    Comment:    For patients >72 years of age, the reference limit for Creatinine is approximately 13% higher for people identified as African-American. [name]   08/08/2019 03:38 PM 1.82 (H) 0.60 - 0.88 mg/dL Final    Comment:    For patients >73 years of age, the reference limit for Creatinine is approximately 13% higher for people identified as African-American. .   04/06/2014 03:43 PM 1.51 (H) 0.50 - 1.10 mg/dL Final  16/01/9603 54:09  PM 1.40 (H) 0.50 - 1.10 mg/dL Final   Creatinine, Ser  Date/Time Value Ref Range Status  02/19/2023 05:08 AM 5.09 (H) 0.44 - 1.00 mg/dL Final  72/53/6644 03:47 AM 5.01 (H) 0.44 - 1.00 mg/dL Final  42/59/5638 75:64 AM 4.28 (H) 0.44 - 1.00 mg/dL Final  33/29/5188 41:66 AM 4.79 (H) 0.44 - 1.00 mg/dL Final  10/29/1599 09:32 AM 5.10 (H) 0.44 - 1.00 mg/dL Final  35/57/3220 25:42 AM 3.54 (H) 0.44 - 1.00 mg/dL Final   70/62/3762 83:15 AM 3.66 (H) 0.44 - 1.00 mg/dL Final  17/61/6073 71:06 PM 3.68 (H) 0.44 - 1.00 mg/dL Final  26/94/8546 27:03 PM 3.34 (H) 0.44 - 1.00 mg/dL Final  50/12/3816 29:93 AM 3.26 (H) 0.44 - 1.00 mg/dL Final  71/69/6789 38:10 AM 3.62 (H) 0.44 - 1.00 mg/dL Final  17/51/0258 52:77 AM 3.67 (H) 0.44 - 1.00 mg/dL Final  82/42/3536 14:43 PM 3.11 (H) 0.44 - 1.00 mg/dL Final  15/40/0867 61:95 PM 3.30 (H) 0.44 - 1.00 mg/dL Final  09/32/6712 45:80 AM 3.46 (H) 0.40 - 1.20 mg/dL Final  99/83/3825 05:39 AM 3.60 (H) 0.44 - 1.00 mg/dL Final  76/73/4193 79:02 AM 3.35 (H) 0.44 - 1.00 mg/dL Final  40/97/3532 99:24 AM 3.71 (H) 0.44 - 1.00 mg/dL Final  26/83/4196 22:29 AM 3.84 (H) 0.44 - 1.00 mg/dL Final  79/89/2119 41:74 AM 3.95 (H) 0.44 - 1.00 mg/dL Final  12/12/4816 56:31 AM 3.89 (H) 0.44 - 1.00 mg/dL Final  49/70/2637 85:88 AM 4.17 (H) 0.44 - 1.00 mg/dL Final  50/27/7412 87:86 AM 4.03 (H) 0.44 - 1.00 mg/dL Final  76/72/0947 09:62 AM 3.98 (H) 0.44 - 1.00 mg/dL Final  83/66/2947 65:46 AM 3.79 (H) 0.44 - 1.00 mg/dL Final  50/35/4656 81:27 AM 3.61 (H) 0.44 - 1.00 mg/dL Final  51/70/0174 94:49 AM 3.40 (H) 0.44 - 1.00 mg/dL Final  67/59/1638 46:65 AM 3.32 (H) 0.44 - 1.00 mg/dL Final  99/35/7017 79:39 AM 3.29 (H) 0.44 - 1.00 mg/dL Final  03/00/9233 00:76 AM 3.38 (H) 0.44 - 1.00 mg/dL Final  22/63/3354 56:25 AM 3.57 (H) 0.44 - 1.00 mg/dL Final  63/89/3734 28:76 AM 3.70 (H) 0.44 - 1.00 mg/dL Final  81/15/7262 03:55 AM 3.48 (H) 0.44 - 1.00 mg/dL Final  97/41/6384 53:64 AM 3.27 (H) 0.44 - 1.00 mg/dL Final  68/06/2120 48:25 AM 3.08 (H) 0.44 - 1.00 mg/dL Final  00/37/0488 89:16 AM 2.89 (H) 0.44 - 1.00 mg/dL Final  94/50/3888 28:00 AM 2.93 (H) 0.44 - 1.00 mg/dL Final  34/91/7915 05:69 AM 3.32 (H) 0.44 - 1.00 mg/dL Final  79/48/0165 53:74 AM 3.69 (H) 0.44 - 1.00 mg/dL Final  82/70/7867 54:49 AM 4.09 (H) 0.44 - 1.00 mg/dL Final  20/01/711 19:75 PM 4.70 (H) 0.44 - 1.00 mg/dL Final  88/32/5498 26:41 AM  2.37 (H) 0.40 - 1.20 mg/dL Final  58/30/9407 68:08 AM 2.55 (H) 0.44 - 1.00 mg/dL Final  81/01/3158 45:85 AM 2.44 (H) 0.44 - 1.00 mg/dL Final  92/92/4462 86:38 AM 2.23 (H) 0.44 - 1.00 mg/dL Final  17/71/1657 90:38 AM 3.02 (H) 0.44 - 1.00 mg/dL Final  33/38/3291 91:66 AM 1.85 (H) 0.44 - 1.00 mg/dL Final  06/00/4599 77:41 AM 1.79 (H) 0.44 - 1.00 mg/dL Final  42/39/5320 23:34 AM 2.03 (H) 0.44 - 1.00 mg/dL Final  35/68/6168 37:29 PM 2.22 (H) 0.44 - 1.00 mg/dL Final  06/11/1550 08:02 AM 1.91 (H) 0.44 - 1.00 mg/dL Final  23/36/1224 49:75 AM 1.90 (H) 0.44 - 1.00 mg/dL Final   Recent Labs  Lab 02/15/23 [phone]  02/15/23 0645 02/16/23 0356 02/18/23 0413 02/19/23 0508  NA 127* 131* 136 131* 129*  K 6.8* 3.9 3.8 3.6 3.7  CL 100 94* 98 95* 95*  CO2  --  19* 19* 19* 19*  GLUCOSE 159* 163* 110* 107* 108*  BUN 129* 90* 77* 79* 82*  CREATININE 5.10* 4.79* 4.28* 5.01* 5.09*  CALCIUM  --  9.8 9.7 9.2 8.5*  PHOS  --   --  5.7*  --   --    Recent Labs  Lab 02/13/23 1240 02/15/23 0551 02/15/23 0635 02/16/23 0356  WBC  --  9.4  --  10.6*  NEUTROABS  --  7.9*  --   --   HGB 11.9* 12.8 15.0 12.6  HCT  --  39.6 44.0 37.8  MCV  --  100.8*  --  99.0  PLT  --  329  --  285   Liver Function Tests: Recent Labs  Lab 02/15/23 0645  AST 24  ALT 17  ALKPHOS 81  BILITOT 0.9  PROT 7.0  ALBUMIN 3.6   No results for input(s): "LIPASE", "AMYLASE" in the last 168 hours. No results for input(s): "AMMONIA" in the last 168 hours. Cardiac Enzymes: No results for input(s): "CKTOTAL", "CKMB", "CKMBINDEX", "TROPONINI" in the last 168 hours. Iron Studies: No results for input(s): "IRON", "TIBC", "TRANSFERRIN", "FERRITIN" in the last 72 hours. PT/INR: @LABRCNTIP (inr:5)  Xrays/Other Studies: ) Results for orders placed or performed during the hospital encounter of 02/15/23 (from the past 48 hour(s))  Basic metabolic panel     Status: Abnormal   Collection Time: 02/18/23  4:13 AM  Result Value Ref Range    Sodium 131 (L) 135 - 145 mmol/L   Potassium 3.6 3.5 - 5.1 mmol/L   Chloride 95 (L) 98 - 111 mmol/L   CO2 19 (L) 22 - 32 mmol/L   Glucose, Bld 107 (H) 70 - 99 mg/dL    Comment: Glucose reference range applies only to samples taken after fasting for at least 8 hours.   BUN 79 (H) 8 - 23 mg/dL   Creatinine, Ser 6.96 (H) 0.44 - 1.00 mg/dL   Calcium 9.2 8.9 - 29.5 mg/dL   GFR, Estimated 8 (L) >60 mL/min    Comment: (NOTE) Calculated using the CKD-EPI Creatinine Equation (2021)    Anion gap 17 (H) 5 - 15    Comment: Performed at [facility], [address] [place], [place] [address]  Basic metabolic panel     Status: Abnormal   Collection Time: 02/19/23  5:08 AM  Result Value Ref Range   Sodium 129 (L) 135 - 145 mmol/L   Potassium 3.7 3.5 - 5.1 mmol/L   Chloride 95 (L) 98 - 111 mmol/L   CO2 19 (L) 22 - 32 mmol/L   Glucose, Bld 108 (H) 70 - 99 mg/dL    Comment: Glucose reference range applies only to samples taken after fasting for at least 8 hours.   BUN 82 (H) 8 - 23 mg/dL   Creatinine, Ser 2.44 (H) 0.44 - 1.00 mg/dL   Calcium 8.5 (L) 8.9 - 10.3 mg/dL   GFR, Estimated 8 (L) >60 mL/min    Comment: (NOTE) Calculated using the CKD-EPI Creatinine Equation (2021)    Anion gap 15 5 - 15    Comment: Performed at [facility], [address], [place], [place] [address]   *Note: Due to a large number of results and/or encounters for the requested time period, some results  have not been displayed. A complete set of results can be found in Results Review.   DG Knee 1-2 Views Right  Result Date: 02/18/2023 CLINICAL DATA:  Right knee pain and swelling following fall, initial encounter EXAM: RIGHT KNEE - 2 VIEW COMPARISON:  08/02/2022 FINDINGS: Tricompartmental degenerative changes are noted. There is suggestion of mildly displaced fracture through the intercondylar eminence of the proximal tibia best seen on the lateral projection. Cross-sectional imaging may be helpful for further  evaluation. Large suprapatellar joint effusion is seen. IMPRESSION: Large joint effusion with findings suspicious for proximal tibial fracture. CT may be helpful when clinically able. Electronically Signed   By: Alcide Clever M.D.   On: 02/18/2023 22:12   US RENAL  Result Date: 02/18/2023 CLINICAL DATA:  Acute kidney injury. EXAM: RENAL / URINARY TRACT ULTRASOUND COMPLETE COMPARISON:  CT 12/08/2022, renal ultrasound 07/11/2022 FINDINGS: Right Kidney: Renal measurements: 8.1 x 4 x 3.3 cm = volume: 55 mL. Renal parenchymal atrophy and increased echogenicity. Simple cyst measures 3.6 cm. Additional smaller cysts. No hydronephrosis. Left Kidney: Renal measurements: 7.2 x 3.3 x 3.6 cm = volume: 44 mL. Renal parenchymal atrophy and increased echogenicity. There are multiple simple cysts, which were better appreciated on prior CT. No hydronephrosis. Bladder: Only minimally distended. Appears normal for degree of bladder distention. Other: None. IMPRESSION: 1. No obstructive uropathy. 2. Renal parenchymal atrophy and findings of chronic medical renal disease. Electronically Signed   By: Narda Rutherford M.D.   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 Most likely scar. Mild subendocardial ischemia. EF 71% LOW RISK.     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 Kellie Simmering   Pulmonary emboli [facility]) 02/06/2022   01/2022 PE/DVT post-op. Lower GI bleed     Treat edema  Start Eliquis at the reduced dose due to Creat>1.5 and age >55   Rheumatoid arthritis (HCC) 2011   Dr.Truslow; fracture knees, hands and wrists -    S/P CABG x 1 1996   CABG--LIMA-LAD for ostial LAD (not felt to be PCI amenable). EF NORMAL then; LIMA now atretic   Shortness of breath dyspnea    with exertion  Stroke (HCC) 11/2014   TIA    Urinary frequency     PSH:   Past Surgical History:  Procedure Laterality Date   ABDOMINAL AORTIC ANEURYSM REPAIR  1996   Complicated by mesenteric artery stenosis and splenic artery infarction with acquired Asplenia   APPENDECTOMY     BIOPSY  07/08/2022   Procedure: BIOPSY;  Surgeon: Benancio Deeds, MD;  Location: [facility] ENDOSCOPY;  Service: Gastroenterology;;   BIOPSY  11/28/2022   Procedure: BIOPSY;  Surgeon: [name], MD;  Location[facility] ENDOSCOPY;  Service: Gastroenterology;;   [name]  07/2011   right foot   CARDIAC CATHETERIZATION  2005   (Most recent CATH) - ostial LAD lesion 20-30% (down from 90% initially). Atretic LIMA. Minimal disease the RCA and Circumflex system.   CARPAL TUNNEL RELEASE Left    CATARACT EXTRACTION Bilateral    CERVICAL SPINE SURGERY     plate 1610 Dr. Wynetta Emery   COLONOSCOPY     COLONOSCOPY WITH PROPOFOL N/A 11/28/2022   Procedure: COLONOSCOPY WITH PROPOFOL;  Surgeon: [name], MD;  Location: [facility] ENDOSCOPY;  Service: Gastroenterology;  Laterality: N/A;    CORONARY ARTERY BYPASS GRAFT  1996   INCLUDED AN INTERNAL MAMMARY ARTERY TO THE LAD. EF WAS NORMAL   ESOPHAGOGASTRODUODENOSCOPY (EGD) WITH PROPOFOL N/A 07/08/2022   Procedure: ESOPHAGOGASTRODUODENOSCOPY (EGD) WITH PROPOFOL;  Surgeon: Benancio Deeds, MD;  Location: [facility] ENDOSCOPY;  Service: Gastroenterology;  Laterality: N/A;   ESOPHAGOGASTRODUODENOSCOPY (EGD) WITH PROPOFOL N/A 11/28/2022   Procedure: ESOPHAGOGASTRODUODENOSCOPY (EGD) WITH PROPOFOL;  Surgeon: [name], MD;  Location: [facility] ENDOSCOPY;  Service: Gastroenterology;  Laterality: N/A;   INGUINAL HERNIA REPAIR Right    LAPAROSCOPIC APPENDECTOMY N/A 06/28/2016   Procedure: APPENDECTOMY LAPAROSCOPIC;  Surgeon: [name], MD;  Location: [facility] ORS;  Service: General;  Laterality: N/A;   NM MYOVIEW LTD  06/2010   Fixed anteroseptal, apical and inferoapical defect with moderate size. Most likely scar. Mild subendocardial ischemia. EF 71% LOW RISK.    POLYPECTOMY  11/28/2022   Procedure: POLYPECTOMY;  Surgeon: [name], MD;  Location: [facility] ENDOSCOPY;  Service: Gastroenterology;;   SPLENECTOMY     TOTAL HIP ARTHROPLASTY Left 01/23/2022   Procedure: LEFT TOTAL HIP ARTHROPLASTY ANTERIOR APPROACH;  Surgeon: [name], MD;  Location: [facility] OR;  Service: Orthopedics;  Laterality: Left;  3-C   TRANSTHORACIC ECHOCARDIOGRAM  12/2014   [facility]: Normal LV size & function. EF 55-60%,    vagina polyp     VIDEO ASSISTED THORACOSCOPY (VATS)/WEDGE RESECTION Left 03/02/2015   Procedure: VIDEO ASSISTED THORACOSCOPY (VATS), MINI THORACOTOMY, LEFT UPPER LOBE WEDGE, TAKE DOWN OF INTERNAL MAMMARY LESIONS, PLACEMENT OF ON-Q PUMP;  Surgeon: [name], MD;  Location: [facility] OR;  Service: Thoracic;  Laterality: Left;   VIDEO BRONCHOSCOPY N/A 03/02/2015   Procedure: BRONCHOSCOPY;  Surgeon: [name], MD;  Location: [facility] OR;  Service: Thoracic;  Laterality: N/A;   VIDEO BRONCHOSCOPY WITH ENDOBRONCHIAL NAVIGATION N/A 10/08/2017    Procedure: VIDEO BRONCHOSCOPY WITH ENDOBRONCHIAL NAVIGATION WITH BIOPSIES OF LEFT UPPER LOBE AND LEFT LOWER LOBE;  Surgeon: [name], MD;  Location: [facility] OR;  Service: Thoracic;  Laterality: N/A;    Allergies:  Allergies  Allergen Reactions   Nsaids Other (See Comments)    Stomach upset Told to avoid due to Eliquis   Aspirin Other (See Comments)    Stomach upset Told to avoid due to Eliquis   Codeine Nausea And Vomiting   Eliquis [Apixaban] Other (See Comments)  Gi upset    Nitrostat [Nitroglycerin] Other (See Comments)    Bradycardia. Drop in heart rate    Tramadol Other (See Comments)    Confusion    Medications:   Prior to Admission medications   Medication Sig Start Date End Date Taking? Authorizing Provider  acetaminophen (TYLENOL) 325 MG tablet Take 2 tablets (650 mg total) by mouth 2 (two) times daily as needed (generalized pain). 08/24/22  Yes Mast, Man X, NP  albuterol (VENTOLIN HFA) 108 (90 Base) MCG/ACT inhaler Inhale 1-2 puffs into the lungs every 4 (four) hours as needed for wheezing or shortness of breath. Patient taking differently: Inhale 1-2 puffs into the lungs as needed for wheezing or shortness of breath. 03/27/22  Yes Mast, Man X, NP  amLODipine (NORVASC) 5 MG tablet Take 1 tablet (5 mg total) by mouth daily. Patient taking differently: Take 2.5 mg by mouth daily. 01/31/23 05/01/23 Yes [name], MD  ascorbic acid (VITAMIN C) 500 MG tablet Take 1 tablet (500 mg total) by mouth daily. 03/27/22  Yes Mast, Man X, NP  atorvastatin (LIPITOR) 80 MG tablet Take 1 tablet (80 mg total) by mouth daily. 02/08/23  Yes Plotnikov, Georgina Quint, MD  carvedilol (COREG) 6.25 MG tablet Take 1 tablet (6.25 mg total) by mouth 2 (two) times daily with a meal. 01/11/23  Yes Plotnikov, Georgina Quint, MD  Cholecalciferol (VITAMIN D3) 50 MCG (2000 UT) capsule Take 1 capsule (2,000 Units total) by mouth daily. 03/27/22  Yes Mast, Man X, NP  cloNIDine (CATAPRES - DOSED IN MG/24 HR)  0.2 mg/24hr patch Place 1 patch (0.2 mg total) onto the skin once a week. 01/20/23  Yes Plotnikov, Georgina Quint, MD  cloNIDine (CATAPRES) 0.2 MG tablet Take 1 tablet (0.2 mg total) by mouth 4 (four) times daily as needed for up to 30 doses. For systolic blood pressure (top number) over 200 mmhg Patient taking differently: Take 0.2 mg by mouth as needed. For systolic blood pressure (top number) over 200 mmhg 10/19/22  Yes Plotnikov, Georgina Quint, MD  clopidogrel (PLAVIX) 75 MG tablet Take 1 tablet (75 mg total) by mouth every other day. 01/31/23  Yes [name], MD  Cyanocobalamin 2500 MCG TABS Take 2,500 mcg by mouth daily. 08/24/22  Yes Mast, Man X, NP  leflunomide (ARAVA) 20 MG tablet Take 1 tablet (20 mg total) by mouth daily. 08/24/22  Yes Mast, Man X, NP  levothyroxine (SYNTHROID) 88 MCG tablet Take 1 tablet (88 mcg total) by mouth daily before breakfast. 08/24/22  Yes Mast, Man X, NP  losartan (COZAAR) 50 MG tablet Take 50 mg by mouth daily.   Yes [provider]  Multiple Vitamins-Minerals (CENTRUM ADULT PO) Take 1 tablet by mouth daily.   Yes [provider]  pantoprazole (PROTONIX) 40 MG tablet Take 1 tablet (40 mg total) by mouth daily. Patient taking differently: Take 40 mg by mouth at bedtime. 08/24/22  Yes Mast, Man X, NP  sertraline (ZOLOFT) 50 MG tablet Take 0.5 tablets (25 mg total) by mouth daily. 12/10/22  Yes Lurene Shadow, MD  torsemide (DEMADEX) 20 MG tablet Take 1 tablet (20 mg total) by mouth See admin instructions. Take 20 mg (1 tablet) by mouth twice a week on Tuesday's and Saturday's. Patient taking differently: Take 20 mg by mouth 2 (two) times a week. Tuesday and Saturday 02/08/23  Yes Plotnikov, Georgina Quint, MD  traMADol (ULTRAM) 50 MG tablet Take by mouth.   Yes [provider]  triamcinolone (NASACORT)  55 MCG/ACT AERO nasal inhaler Place 1 spray into the nose daily. Patient taking differently: Place 1 spray into the nose as needed (running nose).  08/24/22  Yes Mast, Man X, NP  valACYclovir (VALTREX) 1000 MG tablet Take 1 tablet (1,000 mg total) by mouth 3 (three) times daily. 02/08/23  Yes Plotnikov, Georgina Quint, MD    Discontinued Meds:   Medications Discontinued During This Encounter  Medication Reason   torsemide (DEMADEX) tablet 20 mg    fexofenadine (ALLEGRA) tablet 180 mg    valACYclovir (VALTREX) tablet 1,000 mg    Oral care mouth rinse Duplicate   Oral care mouth rinse Duplicate   carvedilol (COREG) tablet 6.25 mg    amLODipine (NORVASC) tablet 5 mg    Fexofenadine HCl (ALLERGY 24-HR PO) Patient Preference   carvedilol (COREG) tablet 12.5 mg    clevidipine (CLEVIPREX) infusion 0.5 mg/mL    Oral care mouth rinse    hydrALAZINE (APRESOLINE) injection 10 mg    labetalol (NORMODYNE) injection 10 mg    carvedilol (COREG) tablet 25 mg    benzonatate (TESSALON) 100 MG capsule Patient Preference   methylPREDNISolone (MEDROL DOSEPAK) 4 MG TBPK tablet Patient Preference   amLODipine (NORVASC) tablet 10 mg    carvedilol (COREG) tablet 12.5 mg    carvedilol (COREG) tablet 6.25 mg    carvedilol (COREG) tablet 12.5 mg    hydrALAZINE (APRESOLINE) injection 10 mg    dextrose 5 %-0.9 % sodium chloride infusion     Social History:  reports that she quit smoking about 64 years ago. Her smoking use included cigarettes. She has never been exposed to tobacco smoke. She has never used smokeless tobacco. She reports current alcohol use. She reports that she does not use drugs.  Family History:   Family History  Problem Relation Age of Onset   Hypertension Mother    Diabetes Mother    Heart disease Mother    Hyperlipidemia Mother    Stroke Father    Hyperlipidemia Sister    Hypertension Sister    Hypertension Daughter    Cancer Paternal Uncle        Deceased from cancer not sure of site   Hypertension Son    Hyperlipidemia Son    Hyperlipidemia Son    Hypertension Son    Hyperlipidemia Son    Hypertension Son    Hypertension  Other    Coronary artery disease Other    Asthma Neg Hx    Colon cancer Neg Hx     Blood pressure (!) 114/54, pulse 84, temperature 98 F (36.7 C), resp. rate 20, height 5\' 3"  (1.6 m), weight 55.6 kg, SpO2 93%. General appearance: alert, cooperative, and appears stated age Head: Normocephalic, without obvious abnormality, atraumatic, mild erythema left side of face Eyes: negative Neck: no adenopathy, no carotid bruit, no JVD, supple, symmetrical, trachea midline, and thyroid not enlarged, symmetric, no tenderness/mass/nodules Back: symmetric, no curvature. ROM normal. No CVA tenderness. Resp: clear to auscultation bilaterally Cardio: regular rate and rhythm GI: soft, non-tender; bowel sounds normal; no masses,  no organomegaly Extremities: extremities normal, atraumatic, no cyanosis or edema Pulses: 2+ and symmetric Skin: mild erythema left side of face       Ethelene Hal, MD 02/19/2023, 10:03 AM

## 2023-02-19 NOTE — Plan of Care (Signed)
  Problem: Education: Goal: Knowledge of General Education information will improve Description: Including pain rating scale, medication(s)/side effects and non-pharmacologic comfort measures Outcome: Progressing   Problem: Health Behavior/Discharge Planning: Goal: Ability to manage health-related needs will improve Outcome: Progressing   Problem: Clinical Measurements: Goal: Ability to maintain clinical measurements within normal limits will improve Outcome: Progressing Goal: Will remain free from infection Outcome: Progressing Goal: Diagnostic test results will improve Outcome: Progressing Goal: Respiratory complications will improve Outcome: Progressing Goal: Cardiovascular complication will be avoided Outcome: Progressing   Problem: Coping: Goal: Level of anxiety will decrease Outcome: Progressing   Problem: Elimination: Goal: Will not experience complications related to urinary retention Outcome: Progressing   Problem: Pain Managment: Goal: General experience of comfort will improve Outcome: Progressing   Problem: Safety: Goal: Ability to remain free from injury will improve Outcome: Progressing   Problem: Skin Integrity: Goal: Risk for impaired skin integrity will decrease Outcome: Progressing   Problem: Activity: dizzy when standing, little tolerance Goal: Risk for activity intolerance will decrease Outcome: Not Progressing   Problem: Nutrition: decreased but improving, feeds self Goal: Adequate nutrition will be maintained Outcome: Not Progressing   Problem: Elimination: meds for constipation Goal: Will not experience complications related to bowel motility Outcome: Not Progressing

## 2023-02-19 NOTE — Consult Note (Signed)
Reason for Consult:Right knee pain Referring Physician: Hartley Barefoot Time called: 1027 Time at bedside: 1133   [name] is an 82 y.o. female.  HPI: [name] was admitted 4d ago with AMS. As she has improved she has c/o right knee pain. She denies any obvious antecedent event but did fall recently. The knee feels a little better compared with yesterday. She denies prior hx/o similar or gout. She lives at [facility] with her husband.  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 Most likely scar. Mild subendocardial ischemia. EF 71% LOW RISK.    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 Kellie Simmering   Pulmonary emboli [facility]) 02/06/2022   01/2022 PE/DVT post-op. Lower GI bleed     Treat edema  Start Eliquis at the reduced dose due to Creat>1.5 and age >86   Rheumatoid arthritis (HCC) 2011   Dr.Truslow; fracture knees, hands  and wrists -    S/P CABG x 1 1996   CABG--LIMA-LAD for ostial LAD (not felt to be PCI amenable). EF NORMAL then; LIMA now atretic   Shortness of breath dyspnea    with exertion   Stroke (HCC) 11/2014   TIA    Urinary frequency     Past Surgical History:  Procedure Laterality Date   ABDOMINAL AORTIC ANEURYSM REPAIR  1996   Complicated by mesenteric artery stenosis and splenic artery infarction with acquired Asplenia   APPENDECTOMY     BIOPSY  07/08/2022   Procedure: BIOPSY;  Surgeon: Benancio Deeds, MD;  Location: [facility] ENDOSCOPY;  Service: Gastroenterology;;   BIOPSY  11/28/2022   Procedure: BIOPSY;  Surgeon: [name], MD;  Location: [facility] ENDOSCOPY;  Service: Gastroenterology;;   [name]  07/2011   right foot   CARDIAC CATHETERIZATION  2005   (Most recent CATH) - ostial LAD lesion 20-30% (down from 90% initially). Atretic LIMA. Minimal disease the RCA and Circumflex system.   CARPAL TUNNEL RELEASE Left    CATARACT EXTRACTION Bilateral    CERVICAL SPINE SURGERY     plate 2536 Dr. Wynetta Emery   COLONOSCOPY     COLONOSCOPY WITH PROPOFOL N/A 11/28/2022   Procedure: COLONOSCOPY WITH PROPOFOL;  Surgeon: [name], MD;  Location:  [facility] ENDOSCOPY;  Service: Gastroenterology;  Laterality: N/A;   CORONARY ARTERY BYPASS GRAFT  1996   INCLUDED AN INTERNAL MAMMARY ARTERY TO THE LAD. EF WAS NORMAL   ESOPHAGOGASTRODUODENOSCOPY (EGD) WITH PROPOFOL N/A 07/08/2022   Procedure: ESOPHAGOGASTRODUODENOSCOPY (EGD) WITH PROPOFOL;  Surgeon: Benancio Deeds, MD;  Location: [facility] ENDOSCOPY;  Service: Gastroenterology;  Laterality: N/A;   ESOPHAGOGASTRODUODENOSCOPY (EGD) WITH PROPOFOL N/A 11/28/2022   Procedure: ESOPHAGOGASTRODUODENOSCOPY (EGD) WITH PROPOFOL;  Surgeon: [name], MD;  Location: [facility] ENDOSCOPY;  Service: Gastroenterology;  Laterality: N/A;   INGUINAL HERNIA REPAIR Right    LAPAROSCOPIC APPENDECTOMY N/A 06/28/2016   Procedure: APPENDECTOMY LAPAROSCOPIC;  Surgeon: [name], MD;   Location: [facility] ORS;  Service: General;  Laterality: N/A;   NM MYOVIEW LTD  06/2010   Fixed anteroseptal, apical and inferoapical defect with moderate size. Most likely scar. Mild subendocardial ischemia. EF 71% LOW RISK.    POLYPECTOMY  11/28/2022   Procedure: POLYPECTOMY;  Surgeon: [name], MD;  Location: [facility] ENDOSCOPY;  Service: Gastroenterology;;   SPLENECTOMY     TOTAL HIP ARTHROPLASTY Left 01/23/2022   Procedure: LEFT TOTAL HIP ARTHROPLASTY ANTERIOR APPROACH;  Surgeon: [name], MD;  Location: [facility] OR;  Service: Orthopedics;  Laterality: Left;  3-C   TRANSTHORACIC ECHOCARDIOGRAM  12/2014   [facility]: Normal LV size & function. EF 55-60%,    vagina polyp     VIDEO ASSISTED THORACOSCOPY (VATS)/WEDGE RESECTION Left 03/02/2015   Procedure: VIDEO ASSISTED THORACOSCOPY (VATS), MINI THORACOTOMY, LEFT UPPER LOBE WEDGE, TAKE DOWN OF INTERNAL MAMMARY LESIONS, PLACEMENT OF ON-Q PUMP;  Surgeon: [name], MD;  Location: [facility] OR;  Service: Thoracic;  Laterality: Left;   VIDEO BRONCHOSCOPY N/A 03/02/2015   Procedure: BRONCHOSCOPY;  Surgeon: [name], MD;  Location: [facility] OR;  Service: Thoracic;  Laterality: N/A;   VIDEO BRONCHOSCOPY WITH ENDOBRONCHIAL NAVIGATION N/A 10/08/2017   Procedure: VIDEO BRONCHOSCOPY WITH ENDOBRONCHIAL NAVIGATION WITH BIOPSIES OF LEFT UPPER LOBE AND LEFT LOWER LOBE;  Surgeon: [name], MD;  Location: [facility] OR;  Service: Thoracic;  Laterality: N/A;    Family History  Problem Relation Age of Onset   Hypertension Mother    Diabetes Mother    Heart disease Mother    Hyperlipidemia Mother    Stroke Father    Hyperlipidemia Sister    Hypertension Sister    Hypertension Daughter    Cancer Paternal Uncle        Deceased from cancer not sure of site   Hypertension Son    Hyperlipidemia Son    Hyperlipidemia Son    Hypertension Son    Hyperlipidemia Son    Hypertension Son    Hypertension Other    Coronary artery disease Other    Asthma  Neg Hx    Colon cancer Neg Hx     Social History:  reports that she quit smoking about 64 years ago. Her smoking use included cigarettes. She has never been exposed to tobacco smoke. She has never used smokeless tobacco. She reports current alcohol use. She reports that she does not use drugs.  Allergies:  Allergies  Allergen Reactions   Nsaids Other (See Comments)    Stomach upset Told to avoid due to Eliquis   Aspirin Other (See Comments)    Stomach upset Told to avoid due to Eliquis   Codeine Nausea And Vomiting   Eliquis [Apixaban] Other (See Comments)    Gi upset    Nitrostat [Nitroglycerin] Other (See Comments)  Bradycardia. Drop in heart rate    Tramadol Other (See Comments)    Confusion    Medications: I have reviewed the patient's current medications.  Results for orders placed or performed during the hospital encounter of 02/15/23 (from the past 48 hour(s))  Basic metabolic panel     Status: Abnormal   Collection Time: 02/18/23  4:13 AM  Result Value Ref Range   Sodium 131 (L) 135 - 145 mmol/L   Potassium 3.6 3.5 - 5.1 mmol/L   Chloride 95 (L) 98 - 111 mmol/L   CO2 19 (L) 22 - 32 mmol/L   Glucose, Bld 107 (H) 70 - 99 mg/dL    Comment: Glucose reference range applies only to samples taken after fasting for at least 8 hours.   BUN 79 (H) 8 - 23 mg/dL   Creatinine, Ser 7.82 (H) 0.44 - 1.00 mg/dL   Calcium 9.2 8.9 - 95.6 mg/dL   GFR, Estimated 8 (L) >60 mL/min    Comment: (NOTE) Calculated using the CKD-EPI Creatinine Equation (2021)    Anion gap 17 (H) 5 - 15    Comment: Performed at [facility], [address], [place], [place] [address]  Basic metabolic panel     Status: Abnormal   Collection Time: 02/19/23  5:08 AM  Result Value Ref Range   Sodium 129 (L) 135 - 145 mmol/L   Potassium 3.7 3.5 - 5.1 mmol/L   Chloride 95 (L) 98 - 111 mmol/L   CO2 19 (L) 22 - 32 mmol/L   Glucose, Bld 108 (H) 70 - 99 mg/dL    Comment: Glucose reference range applies  only to samples taken after fasting for at least 8 hours.   BUN 82 (H) 8 - 23 mg/dL   Creatinine, Ser 6.57 (H) 0.44 - 1.00 mg/dL   Calcium 8.5 (L) 8.9 - 10.3 mg/dL   GFR, Estimated 8 (L) >60 mL/min    Comment: (NOTE) Calculated using the CKD-EPI Creatinine Equation (2021)    Anion gap 15 5 - 15    Comment: Performed at [facility], [address], [place], [place] [address]   *Note: Due to a large number of results and/or encounters for the requested time period, some results have not been displayed. A complete set of results can be found in Results Review.    DG Knee 1-2 Views Right  Result Date: 02/18/2023 CLINICAL DATA:  Right knee pain and swelling following fall, initial encounter EXAM: RIGHT KNEE - 2 VIEW COMPARISON:  08/02/2022 FINDINGS: Tricompartmental degenerative changes are noted. There is suggestion of mildly displaced fracture through the intercondylar eminence of the proximal tibia best seen on the lateral projection. Cross-sectional imaging may be helpful for further evaluation. Large suprapatellar joint effusion is seen. IMPRESSION: Large joint effusion with findings suspicious for proximal tibial fracture. CT may be helpful when clinically able. Electronically Signed   By: Alcide Clever M.D.   On: 02/18/2023 22:12   US RENAL  Result Date: 02/18/2023 CLINICAL DATA:  Acute kidney injury. EXAM: RENAL / URINARY TRACT ULTRASOUND COMPLETE COMPARISON:  CT 12/08/2022, renal ultrasound 07/11/2022 FINDINGS: Right Kidney: Renal measurements: 8.1 x 4 x 3.3 cm = volume: 55 mL. Renal parenchymal atrophy and increased echogenicity. Simple cyst measures 3.6 cm. Additional smaller cysts. No hydronephrosis. Left Kidney: Renal measurements: 7.2 x 3.3 x 3.6 cm = volume: 44 mL. Renal parenchymal atrophy and increased echogenicity. There are multiple simple cysts, which were better appreciated on prior CT. No  hydronephrosis. Bladder: Only minimally distended. Appears normal for degree of  bladder distention. Other: None. IMPRESSION: 1. No obstructive uropathy. 2. Renal parenchymal atrophy and findings of chronic medical renal disease. Electronically Signed   By: Narda Rutherford M.D.   On: 02/18/2023 14:02    Review of Systems  HENT:  Negative for ear discharge, ear pain, hearing loss and tinnitus.   Eyes:  Negative for photophobia and pain.  Respiratory:  Negative for cough and shortness of breath.   Cardiovascular:  Negative for chest pain.  Gastrointestinal:  Negative for abdominal pain, nausea and vomiting.  Genitourinary:  Negative for dysuria, flank pain, frequency and urgency.  Musculoskeletal:  Positive for arthralgias (Right knee). Negative for back pain, myalgias and neck pain.  Neurological:  Negative for dizziness and headaches.  Hematological:  Does not bruise/bleed easily.  Psychiatric/Behavioral:  The patient is not nervous/anxious.    Blood pressure (!) 114/54, pulse 84, temperature 98 F (36.7 C), resp. rate 20, height 5\' 3"  (1.6 m), weight 58.4 kg, SpO2 93%. Physical Exam Constitutional:      General: She is not in acute distress.    Appearance: She is well-developed. She is not diaphoretic.  HENT:     Head: Normocephalic and atraumatic.  Eyes:     General: No scleral icterus.       Right eye: No discharge.        Left eye: No discharge.     Conjunctiva/sclera: Conjunctivae normal.  Cardiovascular:     Rate and Rhythm: Normal rate and regular rhythm.  Pulmonary:     Effort: Pulmonary effort is normal. No respiratory distress.  Musculoskeletal:     Cervical back: Normal range of motion.     Comments: RLE No traumatic wounds, ecchymosis, or rash  Mod TTP joint line  Mod knee effusion  Knee stable to varus/ valgus and anterior/posterior stress but painful  Sens DPN, SPN, TN intact  Motor EHL, ext, flex, evers 5/5  DP 2+, PT 2+, No significant edema  Skin:    General: Skin is warm and dry.  Neurological:     Mental Status: She is alert.   Psychiatric:        Mood and Affect: Mood normal.        Behavior: Behavior normal.    Assessment/Plan: Right knee pain -- No e/o fx. Will perform arthrocentesis later today or in AM.    Freeman Caldron, PA-C Orthopedic Surgery [phone] 02/19/2023, 11:42 AM

## 2023-02-19 NOTE — Evaluation (Signed)
Occupational Therapy Evaluation Patient Details Name: [name] MRN: [id number] DOB: 1940/06/19 Today's Date: 02/19/2023   History of Present Illness 82 y.o. female [facility] on 10[facility]17 with altered mental status with associated hypertensive crisis. AKI on CKD stage IV. MI negative for acute findings, found to have herpes zoster shingles incidentally PMH includes CKD IV, HTN, RA, hypothyroidism, CABG, COPD, HLD, PE.   Clinical Impression   Pt c/o 4/10 pain at rest to R knee, feels weak/fatigued, difficultly staying awake. Pt A/Ox4, able to fully participate despite fatigue. Pt PLOF lives at [facility] with husband who can assist as needed. Pt currently able to transfer to [facility] with min A, not able to ambulate more than a few steps, max A for LB ADLs/toileting hygiene, mod-max for bed mobility. Pt would benefit from postacute rehab <3hrs/day to improve to PLOF prior to returning home. Pt to be seen acutely to maximize progress as able.        If plan is discharge home, recommend the following: A lot of help with walking and/or transfers;A lot of help with bathing/dressing/bathroom;Assist for transportation;Help with stairs or ramp for entrance;Assistance with cooking/housework    Functional Status Assessment  Patient has had a recent decline in their functional status and demonstrates the ability to make significant improvements in function in a reasonable and predictable amount of time.  Equipment Recommendations  Other (comment) (defer)    Recommendations for Other Services       Precautions / Restrictions Precautions Precautions: Fall Precaution Comments: fall prior to hoptialization, hit her head Restrictions Weight Bearing Restrictions: No      Mobility Bed Mobility Overal bed mobility: Needs Assistance Bed Mobility: Supine to Sit, Sit to Supine, Rolling Rolling: Contact guard assist   Supine to sit: Mod assist Sit to supine: Max assist   General bed  mobility comments: mod-max for in/out of bed, HOB elevated, help with scooting back in bed.    Transfers Overall transfer level: Needs assistance Equipment used: Rolling walker (2 wheels) Transfers: Sit to/from Stand, Bed to chair/wheelchair/BSC Sit to Stand: Min assist, From elevated surface     Step pivot transfers: Min assist     General transfer comment: strong min A for STS and step pivot to BSC, poor overall balance.      Balance Overall balance assessment: Needs assistance Sitting-balance support: Feet supported, No upper extremity supported Sitting balance-Leahy Scale: Fair Sitting balance - Comments: able to sit at EOB, lean L/R and forward without physical assistance   Standing balance support: Bilateral upper extremity supported, During functional activity, Reliant on assistive device for balance Standing balance-Leahy Scale: Poor Standing balance comment: poor standing balance, mod effort to stand/maintain balance with RW                           ADL either performed or assessed with clinical judgement   ADL Overall ADL's : Needs assistance/impaired Eating/Feeding: Set up;Sitting   Grooming: Set up;Sitting   Upper Body Bathing: Set up;Sitting   Lower Body Bathing: Moderate assistance;Sitting/lateral leans   Upper Body Dressing : Set up;Sitting   Lower Body Dressing: Moderate assistance;Sit to/from stand   Toilet Transfer: Minimal assistance;BSC/3in1;Rolling walker (2 wheels)   Toileting- Clothing Manipulation and Hygiene: Maximal assistance;Sit to/from stand         General ADL Comments: Pt min A for STS and pivot transfer to BSC, max A for toileting hygiene. Pt set up for feeding  assisting to open containers.     Vision Baseline Vision/History: 1 Wears glasses Ability to See in Adequate Light: 0 Adequate Patient Visual Report: Diplopia       Perception         Praxis         Pertinent Vitals/Pain Pain Assessment Pain  Assessment: Faces Faces Pain Scale: Hurts little more Pain Location: R knee` Pain Descriptors / Indicators: Aching, Sore Pain Intervention(s): Monitored during session     Extremity/Trunk Assessment Upper Extremity Assessment Upper Extremity Assessment: Generalized weakness   Lower Extremity Assessment Lower Extremity Assessment: Defer to PT evaluation       Communication Communication Communication: No apparent difficulties   Cognition Arousal: Alert Behavior During Therapy: Flat affect Overall Cognitive Status: Within Functional Limits for tasks assessed                                       General Comments       Exercises     Shoulder Instructions      Home Living Family/patient expects to be discharged to:: Other (Comment)                                 Additional Comments: ILF at [facility]      Prior Functioning/Environment Prior Level of Function : Independent/Modified Independent             Mobility Comments: ambulatory with SPC vs rollator ADLs Comments: independent with ADLs        OT Problem List:        OT Treatment/Interventions: Self-care/ADL training;Therapeutic exercise;Energy conservation;DME and/or AE instruction;Therapeutic activities;Balance training    OT Goals(Current goals can be found in the care plan section) Acute Rehab OT Goals Patient Stated Goal: to improve strength OT Goal Formulation: With patient Time For Goal Achievement: 03/05/23 Potential to Achieve Goals: Good  OT Frequency: Min 1X/week    Co-evaluation              AM-PAC OT "6 Clicks" Daily Activity     Outcome Measure Help from another person eating meals?: A Little Help from another person taking care of personal grooming?: A Little Help from another person toileting, which includes using toliet, bedpan, or urinal?: A Lot Help from another person bathing (including washing, rinsing, drying)?: A Lot Help from  another person to put on and taking off regular upper body clothing?: A Little Help from another person to put on and taking off regular lower body clothing?: A Lot 6 Click Score: 15   End of Session Equipment Utilized During Treatment: Gait belt;Rolling walker (2 wheels) Nurse Communication: Mobility status  Activity Tolerance: Patient tolerated treatment well Patient left: in bed;with call bell/phone within reach;with bed alarm set  OT Visit Diagnosis: Unsteadiness on feet (R26.81);Other abnormalities of gait and mobility (R26.89);Repeated falls (R29.6);Muscle weakness (generalized) (M62.81);Pain Pain - Right/Left: Right Pain - part of body: Knee                Time: 6213-0865 OT Time Calculation (min): 35 min Charges:  OT General Charges $OT Visit: 1 Visit OT Evaluation $OT Eval Moderate Complexity: 1 Mod OT Treatments $Self Care/Home Management : 8-22 mins  Hinton Luellen, OTR/L   Alexis Goodell 02/19/2023, 1:27 PM

## 2023-02-19 NOTE — Care Management Important Message (Signed)
Important Message  Patient Details  Name: [name] MRN: [id number] Date of Birth: 1940/09/30   Important Message Given:  Yes - Medicare IM     [name] 02/19/2023, 3:29 PM

## 2023-02-20 DIAGNOSIS — I160 Hypertensive urgency: Secondary | ICD-10-CM | POA: Diagnosis not present

## 2023-02-20 LAB — URINALYSIS, ROUTINE W REFLEX MICROSCOPIC
Bilirubin Urine: NEGATIVE
Glucose, UA: NEGATIVE mg/dL
Ketones, ur: NEGATIVE mg/dL
Nitrite: NEGATIVE
Protein, ur: 100 mg/dL — AB
Specific Gravity, Urine: 1.009 (ref 1.005–1.030)
WBC, UA: >50 WBC/hpf (ref 0–5)
pH: 5 (ref 5.0–8.0)

## 2023-02-20 LAB — BASIC METABOLIC PANEL
Anion gap: 17 — ABNORMAL HIGH (ref 5–15)
BUN: 84 mg/dL — ABNORMAL HIGH (ref 8–23)
CO2: 19 mmol/L — ABNORMAL LOW (ref 22–32)
Calcium: 8.9 mg/dL (ref 8.9–10.3)
Chloride: 95 mmol/L — ABNORMAL LOW (ref 98–111)
Creatinine, Ser: 4.97 mg/dL — ABNORMAL HIGH (ref 0.44–1.00)
GFR, Estimated: 8 mL/min — ABNORMAL LOW (ref >60)
Glucose, Bld: 95 mg/dL (ref 70–99)
Potassium: 3.7 mmol/L (ref 3.5–5.1)
Sodium: 131 mmol/L — ABNORMAL LOW (ref 135–145)

## 2023-02-20 LAB — CBC
HCT: 31.3 % — ABNORMAL LOW (ref 36.0–46.0)
Hemoglobin: 10.5 g/dL — ABNORMAL LOW (ref 12.0–15.0)
MCH: 32.3 pg (ref 26.0–34.0)
MCHC: 33.5 g/dL (ref 30.0–36.0)
MCV: 96.3 fL (ref 80.0–100.0)
Platelets: 250 10*3/uL (ref 150–400)
RBC: 3.25 MIL/uL — ABNORMAL LOW (ref 3.87–5.11)
RDW: 17.4 % — ABNORMAL HIGH (ref 11.5–15.5)
WBC: 9.3 10*3/uL (ref 4.0–10.5)
nRBC: 2.9 % — ABNORMAL HIGH (ref 0.0–0.2)

## 2023-02-20 LAB — SYNOVIAL CELL COUNT + DIFF, W/ CRYSTALS
Eosinophils-Synovial: 2 % — ABNORMAL HIGH (ref 0–1)
Lymphocytes-Synovial Fld: 5 % (ref 0–20)
Monocyte-Macrophage-Synovial Fluid: 11 % — ABNORMAL LOW (ref 50–90)
Neutrophil, Synovial: 82 % — ABNORMAL HIGH (ref 0–25)
WBC, Synovial: 23000 /mm3 — ABNORMAL HIGH (ref 0–200)

## 2023-02-20 MED ORDER — SODIUM CHLORIDE 0.9 % IV SOLN
1.0000 g | INTRAVENOUS | Status: DC
  Administered 2023-02-20 – 2023-02-22 (×3): 1 g via INTRAVENOUS
  Filled 2023-02-20 (×4): qty 10

## 2023-02-20 MED ORDER — SENNA 8.6 MG PO TABS
1.0000 | ORAL_TABLET | Freq: Every day | ORAL | Status: DC
  Administered 2023-02-20 – 2023-02-26 (×7): 8.6 mg via ORAL
  Filled 2023-02-20 (×7): qty 1

## 2023-02-20 MED ORDER — ALLOPURINOL 100 MG PO TABS
100.0000 mg | ORAL_TABLET | Freq: Every day | ORAL | Status: DC
  Administered 2023-02-20 – 2023-02-26 (×7): 100 mg via ORAL
  Filled 2023-02-20 (×7): qty 1

## 2023-02-20 MED ORDER — CLONIDINE HCL 0.1 MG/24HR TD PTWK
0.1000 mg | MEDICATED_PATCH | TRANSDERMAL | Status: DC
  Filled 2023-02-20: qty 1

## 2023-02-20 NOTE — Progress Notes (Signed)
Physical Therapy Treatment Patient Details Name: [name] MRN: [id number] DOB: 12/06/1940 Today's Date: 02/20/2023   History of Present Illness 81 y.o. female presents to [facility] hospital on 10/17 with altered mental status with associated hypertensive crisis. AKI on CKD stage IV. MI negative for acute findings, found to have herpes zoster shingles incidentally. 10/22 s/p R knee aspiration and steroid injection by orthopedic PA. PMH includes CKD IV, HTN, RA, hypothyroidism, CABG, COPD, HLD, PE.    PT Comments  Pt received in supine, lethargic and needing max cues and increased time to awaken, with limited participation due to poor attention to task/drowsiness throughout. Son present and receptive to instruction on assisting pt with supine BLE HEP (handout brought to room to reinforce) and pt able to demo back IS use x10 reps with good effort toward end of session. Pt repositioned into bed chair posture and needing up to modA for supine to long sitting during transition with HHA/bed rail support. Defer OOB/OOB to chair per pt request as she reports she was up to chair for 4 hours earlier in the day (confirmed by RN). Discussed with son delirium mitigation precs including keeping HOB up, blinds open, lights on during the day so she can sleep better at night. Pt continues to benefit from PT services to progress toward functional mobility goals, plan to check orthostatic BP readings next session if pt able to participate more fully.     If plan is discharge home, recommend the following: A lot of help with walking and/or transfers;A lot of help with bathing/dressing/bathroom;Assistance with cooking/housework;Direct supervision/assist for medications management;Direct supervision/assist for financial management;Assist for transportation;Help with stairs or ramp for entrance   Can travel by private vehicle     No  Equipment Recommendations  None recommended by PT (TBD post-acute)    Recommendations for  Other Services       Precautions / Restrictions Precautions Precautions: Fall Precaution Comments: fall and hit head prior to admission; SBP goal <150 per MD (permissive HTN due to recent orthostatic hypotension) Restrictions Weight Bearing Restrictions: No     Mobility  Bed Mobility Overal bed mobility: Needs Assistance Bed Mobility: Supine to Sit, Sit to Supine     Supine to sit: Mod assist, HOB elevated, Used rails Sit to supine: Mod assist, HOB elevated, Used rails   General bed mobility comments: with HoB elevated and vc for sequencing requires modA for trunk raising to long sitting. Pt quick to fatigue and remains drowsy, closing eyes despite cues for attention to task. Pillows readjusted behind her and pt positioned in bed chair posture, pt too lethargic to participate in EOB/standing tasks during session.    Transfers Overall transfer level: Needs assistance                 General transfer comment: Defer due to pt lethargy with repositioning in bed, pt dinner arrived ~1 hour prior and pt not yet started to eat, son encouraging her to eat at end of session.    Ambulation/Gait                   Stairs             Wheelchair Mobility     Tilt Bed    Modified Rankin (Stroke Patients Only)       Balance Overall balance assessment: Needs assistance Sitting-balance support: Bilateral upper extremity supported, Feet unsupported Sitting balance-Leahy Scale: Poor Sitting balance - Comments: long sitting in bed with R rail and  LUE on mattress; needs trunk support to maintain upright, pt quick to fatigue.       Standing balance comment: defer due to lethargy                            Cognition Arousal: Lethargic Behavior During Therapy: Flat affect Overall Cognitive Status: Difficult to assess                                 General Comments: Pt lethargic throughout despite repositioning and verbal/tactile cues  from PTA and encouragement from her son who was in the room. PTA brought her coffee (half decaf half regular) toward end of session per pt/son request so she can perk up more to eat her dinner.        Exercises General Exercises - Lower Extremity Ankle Circles/Pumps: AROM, AAROM, Both, 10 reps, Supine Heel Slides: AROM, AAROM, Both, 10 reps, Supine Hip ABduction/ADduction: AROM, AAROM, Both, 10 reps, Supine Other Exercises Other Exercises: IS x 10 reps pt achieves ~[phone] mL, encouraged hourly use x10 reps Other Exercises: supine BLE HEP handout brought to room "General Strengthening Supine" handout PTA wrote freq on handout    General Comments General comments (skin integrity, edema, etc.): VSS on RA per chart review, pt refusing EOB/OOB and lethargic so defer orthostatic BP readings until next session if pt participating more.      Pertinent Vitals/Pain Pain Assessment Pain Assessment: Faces Faces Pain Scale: Hurts a little bit Pain Location: general discomfort, R knee (improved from previous session per pt) Pain Descriptors / Indicators: Grimacing, Discomfort Pain Intervention(s): Limited activity within patient's tolerance, Monitored during session, Repositioned, Other (comment) (pt had tylenol 3 hours prior)     PT Goals (current goals can now be found in the care plan section) Acute Rehab PT Goals Patient Stated Goal: none stated by pt, per son for her to get stronger and to move better so she can be more independent PT Goal Formulation: With patient/family Time For Goal Achievement: 03/04/23 Progress towards PT goals: Progressing toward goals    Frequency    Min 1X/week      PT Plan         AM-PAC PT "6 Clicks" Mobility   Outcome Measure  Help needed turning from your back to your side while in a flat bed without using bedrails?: A Little Help needed moving from lying on your back to sitting on the side of a flat bed without using bedrails?: A Lot Help  needed moving to and from a bed to a chair (including a wheelchair)?: Total Help needed standing up from a chair using your arms (e.g., wheelchair or bedside chair)?: Total Help needed to walk in hospital room?: Total Help needed climbing 3-5 steps with a railing? : Total 6 Click Score: 9    End of Session   Activity Tolerance: Patient limited by lethargy Patient left: in bed;with call bell/phone within reach;with bed alarm set;with family/visitor present;Other (comment) (bed in chair posture, pt positioned more upright with pillows) Nurse Communication: Mobility status;Other (comment) (lethargy, pt encouraged to eat) PT Visit Diagnosis: History of falling (Z91.81);Muscle weakness (generalized) (M62.81);Difficulty in walking, not elsewhere classified (R26.2);Other symptoms and signs involving the nervous system (R29.898);Pain     Time: 1718-1740 PT Time Calculation (min) (ACUTE ONLY): 22 min  Charges:    $Therapeutic Exercise: 8-22 mins PT General Charges $$ ACUTE  PT VISIT: 1 Visit                     Markas Aldredge P., PTA Acute Rehabilitation Services Secure Chat Preferred 9a-5:30pm Office: [phone]    Dorathy Kinsman [facility] 02/20/2023, 6:41 PM

## 2023-02-20 NOTE — TOC Initial Note (Signed)
Transition of Care [facility]) - Initial/Assessment Note    Patient Details  Name: [name] MRN: [id number] Date of Birth: December 22, 1940  Transition of Care [facility]) CM/SW Contact:    Erin Sons, LCSW Phone Number: 02/20/2023, 12:38 PM  Clinical Narrative:                  CSW met with pt to discuss SNF recommendation. Pt lives at ILF at [facility]. Pt agreeable to SNF at [place]. [facility] has a SNF bed for pt at [place]. CSW to complete fl2 and send in hub.    Expected Discharge Plan: Skilled Nursing Facility Barriers to Discharge: Continued Medical Work up   Patient Goals and CMS Choice            Expected Discharge Plan and Services       Living arrangements for the past 2 months: Independent Living Facility                                      Prior Living Arrangements/Services Living arrangements for the past 2 months: Independent Living Facility Lives with:: Spouse Patient language and need for interpreter reviewed:: Yes        Need for Family Participation in Patient Care: Yes (Comment) Care giver support system in place?: Yes (comment)   Criminal Activity/Legal Involvement Pertinent to Current Situation/Hospitalization: No - Comment as needed  Activities of Daily Living   ADL Screening (condition at time of admission) Independently performs ADLs?: No Does the patient have a NEW difficulty with bathing/dressing/toileting/self-feeding that is expected to last >3 days?: Yes (Initiates electronic notice to provider for possible OT consult) Does the patient have a NEW difficulty with getting in/out of bed, walking, or climbing stairs that is expected to last >3 days?: Yes (Initiates electronic notice to provider for possible PT consult) Does the patient have a NEW difficulty with communication that is expected to last >3 days?: No Is the patient deaf or have difficulty hearing?: No Does the patient have difficulty seeing, even when wearing  glasses/contacts?: Yes Does the patient have difficulty concentrating, remembering, or making decisions?: No  Permission Sought/Granted                  Emotional Assessment Appearance:: Appears stated age Attitude/Demeanor/Rapport: Lethargic Affect (typically observed): Accepting Orientation: : Oriented to Self, Oriented to Place, Oriented to  Time, Oriented to Situation Alcohol / Substance Use: Not Applicable Psych Involvement: No (comment)  Admission diagnosis:  Hypertensive urgency [I16.0] AKI (acute kidney injury) (HCC) [N17.9] Hypertensive emergency [I16.1] Hypothermia, initial encounter [T68.XXXA] Patient Active Problem List   Diagnosis Date Noted   Hypertensive urgency 02/15/2023   Zoster 02/08/2023   Headache 02/08/2023   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   Acute otitis externa of left ear 01/26/2023   Tooth abscess 01/26/2023   COVID-19 12/25/2022   GI bleed 12/08/2022   Bleeding hemorrhoids 12/08/2022   Rectal bleeding 11/30/2022   Acute GI bleeding 11/27/2022   Chronic anticoagulation 11/27/2022   Lower GI bleed 11/27/2022   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   Anxiety 10/02/2022   Severe obstructive sleep apnea 08/24/2022   Nasal bones, closed fracture 08/07/2022   Edema 07/26/2022   Acute gastric ulcer with hemorrhage 07/08/2022   Duodenal ulcer 07/08/2022   Dark stools 07/07/2022   Anemia 07/07/2022   E coli bacteremia 07/04/2022   Acute pyelonephritis 07/03/2022  Acute metabolic encephalopathy 07/03/2022   Hyponatremia 07/03/2022   Sepsis with acute renal failure (HCC) 07/03/2022   DNR (do not resuscitate)/DNI(Do Not Intubate) 07/03/2022   Generalized weakness 05/23/2022   Gallstones 03/17/2022   Acute renal failure superimposed on stage 4 chronic kidney disease (HCC) 03/06/2022   Metabolic acidosis, increased anion gap 03/06/2022   Hip dislocation, left (HCC) 03/06/2022   History of anemia due to chronic kidney disease  03/06/2022   History of DVT (deep vein thrombosis) 03/06/2022   Allergic rhinitis 03/06/2022   History of pulmonary embolism 02/06/2022   Vitamin D deficiency 01/30/2022   Slow transit constipation 01/30/2022   GERD (gastroesophageal reflux disease) 01/30/2022   Status post total replacement of left hip 01/23/2022   Melena    Upper GI bleed 11/17/2021   CKD (chronic kidney disease) stage 4, GFR 15-29 ml/min (HCC) - baseline SCr 2.5. follows with Dr. Malen Gauze with [facility] 11/17/2021   Primary osteoarthritis of left hip 10/06/2021   Aortic atherosclerosis (HCC) 09/07/2020   Neoplasm of uncertain behavior of skin 03/14/2020   Arthralgia 12/25/2019   Ankle swelling 08/08/2019   Osteoporosis 02/26/2018   Colon polyp 06/28/2016   COPD (chronic obstructive pulmonary disease) (HCC) 05/28/2016   Upper airway cough syndrome 05/22/2016   Acute respiratory failure (HCC) 04/15/2016   Wrist pain, right 12/14/2015   Lung mass 03/02/2015   Pre-operative cardiovascular examination 02/21/2015   DOE (dyspnea on exertion) 02/16/2015   TIA (transient ischemic attack) 12/31/2014   Abnormal mental state 12/30/2014   Well adult exam 11/26/2014   Seasonal allergies 12/31/2013   Splenic artery aneurysm (HCC) 04/07/2013   Cerumen impaction 03/20/2013   Hyperlipidemia with target LDL less than 70    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   Anticoagulated 11/08/2010   Rheumatoid arthritis (HCC) 06/23/2010   Pain in joint 11/26/2009   Acquired hypothyroidism 12/08/2006   Macular degeneration (senile) of retina 12/08/2006   Essential hypertension 12/08/2006   Diaphragmatic hernia 12/08/2006   ASPLENIA 12/08/2006   Coronary artery disease involving native coronary artery of native heart without angina pectoris 01/30/1995   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) 12/22/1994    Class: History of   S/P CABG x 1: LIMA-LAD for ostial LAD  lesion; now atretic and significantly improved LAD lesion without intervention 12/22/1994    Class: History of   PCP:  Plotnikov, Georgina Quint, MD Pharmacy:   [id number] [place], [place] - [address] [place] [address] Phone: [phone] Fax: [phone]  [facility] - [place], [place] - [address] [place] [address] Phone: [phone] Fax: [phone]     Social Determinants of Health (SDOH) Social History: SDOH Screenings   Food Insecurity: No Food Insecurity (02/17/2023)  Housing: Patient Declined (02/17/2023)  Transportation Needs: No Transportation Needs (02/17/2023)  Utilities: Not At Risk (02/17/2023)  Alcohol Screen: Low Risk  (05/30/2021)  Depression (PHQ2-9): Low Risk  (02/08/2023)  Recent Concern: Depression (PHQ2-9) - Medium Risk (01/22/2023)  Financial Resource Strain: Low Risk  (09/05/2022)  Physical Activity: Insufficiently Active (01/22/2023)  Social Connections: Moderately Integrated (09/05/2022)  Stress: Stress Concern Present (01/22/2023)  Tobacco Use: Medium Risk (02/09/2023)   SDOH Interventions:     Readmission Risk Interventions    07/05/2022    9:42 AM  Readmission Risk Prevention Plan  Transportation Screening Complete  Medication Review (RN Care Manager) Complete  PCP or  Specialist appointment within 3-5 days of discharge Complete  HRI or Home Care Consult Complete  SW Recovery Care/Counseling Consult Complete  Palliative Care Screening Not Applicable  Skilled Nursing Facility Complete

## 2023-02-20 NOTE — Progress Notes (Signed)
[facility] Progress Note   82 y.o. 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 SBP  244 by EMS and left facial droop and LUE weakness. CT negative for bleed and the patient was given IV labetalol and started on a Cleviprex gtt in the ED. BUN/Cr was 90/4.8 in the ED with a negative UDS. She did have a fall a few days prior to admission with head trauma but again CT neg for bleed.  Cr  had decr from 5.1 -> nadir of 4.28 during this hospitalization before increasing to 5.09 but her baseline cr appears to be in the 3.2-3.7 range followed by Dr. Vallery Sa with [facility].   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 Vallery Sa with [facility].  BP dropped as low as 94/41 on 10/19, 90/32 on 10/20 but otherwise running in the 159-170's systolic range. Initially renal function was improving but then trended up which is likely related to the hypotensive episodes with systolics dropping at times to low 90's.  Urine protein >300. Weight has decr from 59kg to 54kg. UOP 2.7L /24hrs earlier in the hospitalzation but has dropped to oliguric range. Renal ultrasound shows small kidneys with low volumes, incr echogenicity but no e/o obstruction. 114/54 morning of consultation. - antihypertensives being titrated to hopefully prevent episodes of hypotension. Currently on Amlodipine 5mg  daily, carvedilol 6.25 BID, Catapres patch 0.2 on Thursdays, hydralazine PRN. Still having drops in BP but seem to be fewer episodes.   Would consider decreasing the Catapres to TTS1 on Thursday or even 0.1mg  PO BID to decrease risk of hypotensive episodes.  - agree with management and holding the ARB with AKI.  - will follow closely with you; even systolics in the 140-160's is fine and we can slowly titrate up on her antihypertensives as needed as an outpt.   - Avoid nephrotoxic medications including NSAIDs and iodinated  intravenous contrast exposure unless the latter is absolutely indicated.   - Preferred narcotic agents for pain control are hydromorphone, fentanyl, and methadone. Morphine should not be used.  - Avoid Baclofen and avoid oral sodium phosphate and magnesium citrate based laxatives / bowel preps.  - Continue strict Input and Output monitoring. Will monitor the patient closely with you and intervene or adjust therapy as indicated by changes in clinical status/labs   AMS - had improved, more somnolent today but easily arousable. Hypertensive crisis initially treated with Labetolol and Cleviprex gtt initially now being managed with Catapres patch 0.2, amlodipine 5 and carvedilol 6.25 BID with prn hydralazine. Herpes zoster tx valcyte CASHD s/p CABG - no chest pain or signs of ischemia. RA  Subjective:   Somnolent but arousable and cooperative. Denies f/c/n/v/sob/ ha.    Objective:   BP (!) 164/73 (BP Location: Right Arm)   Pulse 84   Temp 98.6 F (37 C) (Oral)   Resp 18   Ht 5\' 3"  (1.6 m)   Wt 58.4 kg   SpO2 96%   BMI 22.81 kg/m   Intake/Output Summary (Last 24 hours) at 02/20/2023 1150 Last data filed at 02/20/2023 0600 Gross per 24 hour  Intake 360 ml  Output 800 ml  Net -440 ml   Weight change:   Physical Exam: General appearance: NAD, somnolent Head: NCAT Neck: no JVD, supple Back: symmetric Resp: CTA b/l Cardio: RRR GI: SNDNT +BS Extremities: no edema Pulses: 2+ and symmetric Skin: mild  erythema left side of face  Imaging: CT KNEE RIGHT WO CONTRAST  Result Date: 02/19/2023 CLINICAL DATA:  Knee pain EXAM: CT OF THE RIGHT KNEE WITHOUT CONTRAST TECHNIQUE: Multidetector CT imaging of the right knee was performed according to the standard protocol. Multiplanar CT image reconstructions were also generated. RADIATION DOSE REDUCTION: This exam was performed according to the departmental dose-optimization program which includes automated exposure control, adjustment of the  mA and/or kV according to patient size and/or use of iterative reconstruction technique. COMPARISON:  02/18/2023 radiographs FINDINGS: Bones/Joint/Cartilage Meniscal chondrocalcinosis. Large knee joint effusion. Cortical thickening posterolaterally in the lateral tibial plateau and posterolaterally along the lateral femoral condyle, possibly degenerative although subcortical stress fractures are not excluded. There is some widening of the lateral compartment posteriorly raise the possibility of a prior compression of the lateral tibial plateau but no well-defined acute cortical fracture is identified. Tricompartmental marginal spurring noted. Ligaments Suboptimally assessed by CT. Muscles and Tendons Unremarkable Soft tissues Moderate-sized Baker's cyst. Distal SFA and popliteal artery atheromatous vascular calcification. Subcutaneous edema anterior to the junction of the quadriceps tendon and lateral patellar retinaculum. IMPRESSION: 1. Large knee joint effusion. 2. Cortical thickening posterolaterally in the lateral tibial plateau and posterolaterally along the lateral femoral condyle, possibly degenerative although subcortical stress fractures are not excluded. There is some widening of the lateral compartment posteriorly raise the possibility of a prior compression of the lateral tibial plateau but no well-defined acute cortical fracture is identified. 3. 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 4. Moderate-sized Baker's cyst. 5. Subcutaneous edema anterior to the junction of the quadriceps tendon and lateral patellar retinaculum. 6. Distal SFA and popliteal artery atheromatous vascular calcification. Electronically Signed   By: Gaylyn Rong M.D.   On: 02/19/2023 13:33   DG Knee 1-2 Views Right  Result Date: 02/18/2023 CLINICAL DATA:  Right knee pain and swelling following fall, initial encounter EXAM: RIGHT KNEE - 2 VIEW COMPARISON:  08/02/2022 FINDINGS: Tricompartmental  degenerative changes are noted. There is suggestion of mildly displaced fracture through the intercondylar eminence of the proximal tibia best seen on the lateral projection. Cross-sectional imaging may be helpful for further evaluation. Large suprapatellar joint effusion is seen. IMPRESSION: Large joint effusion with findings suspicious for proximal tibial fracture. CT may be helpful when clinically able. Electronically Signed   By: Alcide Clever M.D.   On: 02/18/2023 22:12    Labs: BMET Recent Labs  Lab 02/15/23 0635 02/15/23 0645 02/16/23 0356 02/18/23 0413 02/19/23 0508 02/20/23 0358  NA 127* 131* 136 131* 129* 131*  K 6.8* 3.9 3.8 3.6 3.7 3.7  CL 100 94* 98 95* 95* 95*  CO2  --  19* 19* 19* 19* 19*  GLUCOSE 159* 163* 110* 107* 108* 95  BUN 129* 90* 77* 79* 82* 84*  CREATININE 5.10* 4.79* 4.28* 5.01* 5.09* 4.97*  CALCIUM  --  9.8 9.7 9.2 8.5* 8.9  PHOS  --   --  5.7*  --   --   --    CBC Recent Labs  Lab 02/15/23 0551 02/15/23 0635 02/16/23 0356 02/20/23 0358  WBC 9.4  --  10.6* 9.3  NEUTROABS 7.9*  --   --   --   HGB 12.8 15.0 12.6 10.5*  HCT 39.6 44.0 37.8 31.3*  MCV 100.8*  --  99.0 96.3  PLT 329  --  285 250    Medications:     acetaminophen  500 mg Oral TID   amLODipine  5 mg Oral Daily  ascorbic acid  500 mg Oral Daily   atorvastatin  80 mg Oral Daily   bupivacaine(PF)  10 mL Infiltration Once   carvedilol  6.25 mg Oral BID WC   Chlorhexidine Gluconate Cloth  6 each Topical Daily   cholecalciferol  2,000 Units Oral Daily   cloNIDine  0.2 mg Transdermal Weekly   clopidogrel  75 mg Oral QODAY   cyanocobalamin  2,500 mcg Oral Daily   feeding supplement  237 mL Oral BID BM   heparin  5,000 Units Subcutaneous Q8H   leflunomide  20 mg Oral Daily   levothyroxine  88 mcg Oral QAC breakfast   loratadine  10 mg Oral Daily   methylPREDNISolone acetate  40 mg Intra-articular Once   multivitamin with minerals  1 tablet Oral Daily   pantoprazole  40 mg Oral Daily    sertraline  25 mg Oral Daily   triamcinolone  1 spray Nasal Daily      Paulene Floor, MD 02/20/2023, 11:50 AM

## 2023-02-20 NOTE — [place] FL2 (Signed)
[place] MEDICAID FL2 LEVEL OF CARE FORM     IDENTIFICATION  Patient Name: [name] Birthdate: 03/03/1941 Sex: female Admission Date (Current Location): 02/15/2023  [facility] and [place] Number:  Producer, television/film/video and Address:  The . [facility], 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   Acute otitis externa of left ear 01/26/2023   Tooth abscess 01/26/2023   COVID-19 12/25/2022   GI bleed 12/08/2022   Bleeding hemorrhoids 12/08/2022   Rectal bleeding 11/30/2022   Acute GI bleeding 11/27/2022   Chronic anticoagulation 11/27/2022   Lower GI bleed 11/27/2022   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   Anxiety 10/02/2022   Severe obstructive sleep apnea 08/24/2022   Nasal bones, closed fracture 08/07/2022   Edema 07/26/2022   Acute gastric ulcer with hemorrhage 07/08/2022   Duodenal ulcer 07/08/2022   Dark stools 07/07/2022   Anemia 07/07/2022   E coli bacteremia 07/04/2022   Acute pyelonephritis 07/03/2022   Acute metabolic encephalopathy 07/03/2022   Hyponatremia 07/03/2022   Sepsis with acute renal failure (HCC) 07/03/2022   DNR (do not resuscitate)/DNI(Do Not Intubate) 07/03/2022   Generalized weakness 05/23/2022   Gallstones 03/17/2022   Acute renal failure superimposed on stage 4 chronic kidney disease (HCC) 03/06/2022   Metabolic acidosis, increased anion gap 03/06/2022   Hip dislocation, left (HCC) 03/06/2022    History of anemia due to chronic kidney disease 03/06/2022   History of DVT (deep vein thrombosis) 03/06/2022   Allergic rhinitis 03/06/2022   History of pulmonary embolism 02/06/2022   Vitamin D deficiency 01/30/2022   Slow transit constipation 01/30/2022   GERD (gastroesophageal reflux disease) 01/30/2022   Status post total replacement of left hip 01/23/2022   Melena    Upper GI bleed 11/17/2021   CKD (chronic kidney disease) stage 4, GFR 15-29 ml/min (HCC) - baseline SCr 2.5. follows with Dr. Malen Gauze with [facility] 11/17/2021   Primary osteoarthritis of left hip 10/06/2021   Aortic atherosclerosis (HCC) 09/07/2020   Neoplasm of uncertain behavior of skin 03/14/2020   Arthralgia 12/25/2019   Ankle swelling 08/08/2019   Osteoporosis 02/26/2018   Colon polyp 06/28/2016   COPD (chronic obstructive pulmonary disease) (HCC) 05/28/2016   Upper airway cough syndrome 05/22/2016   Acute respiratory failure (HCC) 04/15/2016   Wrist pain, right 12/14/2015   Lung mass 03/02/2015   Pre-operative cardiovascular examination 02/21/2015   DOE (dyspnea on exertion) 02/16/2015   TIA (transient ischemic attack) 12/31/2014   Abnormal mental state 12/30/2014   Well adult exam 11/26/2014   Seasonal allergies 12/31/2013   Splenic artery aneurysm (HCC) 04/07/2013   Cerumen impaction 03/20/2013   Hyperlipidemia with target LDL less than 70    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   Anticoagulated 11/08/2010   Rheumatoid arthritis (HCC) 06/23/2010   Pain in joint 11/26/2009   Acquired hypothyroidism 12/08/2006   Macular degeneration (senile) of retina 12/08/2006   Essential hypertension 12/08/2006   Diaphragmatic hernia  12/08/2006   ASPLENIA 12/08/2006   Coronary artery disease involving native coronary artery of native heart without angina pectoris 01/30/1995   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) 12/22/1994   S/P CABG x 1:  LIMA-LAD for ostial LAD lesion; now atretic and significantly improved LAD lesion without intervention 12/22/1994    Orientation RESPIRATION BLADDER Height & Weight     Self, Time, Situation, Place  Normal Incontinent, External catheter Weight: 128 lb 12 oz (58.4 kg) Height:  5\' 3"  (160 cm)  BEHAVIORAL SYMPTOMS/MOOD NEUROLOGICAL BOWEL NUTRITION STATUS      Incontinent Diet (See d/c summary)  AMBULATORY STATUS COMMUNICATION OF NEEDS Skin   Extensive Assist Verbally Normal                       Personal Care Assistance Level of Assistance  Bathing, Dressing, Feeding Bathing Assistance: Maximum assistance Feeding assistance: Independent Dressing Assistance: Maximum assistance     Functional Limitations Info  Sight, Hearing, Speech Sight Info: Impaired Hearing Info: Adequate Speech Info: Adequate    SPECIAL CARE FACTORS FREQUENCY  PT (By licensed PT), OT (By licensed OT)     PT Frequency: 5x/week OT Frequency: 5x/week            Contractures Contractures Info: Not present    Additional Factors Info  Code Status, Allergies Code Status Info: DNR Allergies Info: Eliquis (apixaban), Nsaids, aspirin, codeine, tramadol, Nitrostat (nitroglycerin)           Current Medications (02/20/2023):  This is the current hospital active medication list Current Facility-Administered Medications  Medication Dose Route Frequency Provider Last Rate Last Admin   acetaminophen (TYLENOL) tablet 500 mg  500 mg Oral TID Regalado, Belkys A, MD   500 mg at 02/20/23 0909   acetaminophen (TYLENOL) tablet 650 mg  650 mg Oral BID PRN Celine Mans, Rahul P, PA-C   650 mg at 02/18/23 1036   albuterol (PROVENTIL) (2.5 MG/3ML) 0.083% nebulizer solution 2.5 mg  2.5 mg Inhalation Q4H PRN Desai, Rahul P, PA-C       amLODipine (NORVASC) tablet 5 mg  5 mg Oral Daily Regalado, Belkys A, MD   5 mg at 02/20/23 4010   ascorbic acid (VITAMIN C) tablet 500 mg  500 mg Oral Daily Desai, Rahul P, PA-C   500 mg at  02/20/23 0909   atorvastatin (LIPITOR) tablet 80 mg  80 mg Oral Daily Desai, Rahul P, PA-C   80 mg at 02/20/23 2725   benzonatate (TESSALON) capsule 100 mg  100 mg Oral TID PRN Celine Mans, Rahul P, PA-C       carvedilol (COREG) tablet 6.25 mg  6.25 mg Oral BID WC Regalado, Belkys A, MD   6.25 mg at 02/20/23 0909   Chlorhexidine Gluconate Cloth 2 % PADS 6 each  6 each Topical Daily Lynnell Catalan, MD   6 each at 02/17/23 0959   cholecalciferol (VITAMIN D3) 25 MCG (1000 UNIT) tablet 2,000 Units  2,000 Units Oral Daily Desai, Rahul P, PA-C   2,000 Units at 02/20/23 0908   cloNIDine (CATAPRES - Dosed in mg/24 hr) patch 0.2 mg  0.2 mg Transdermal Weekly Desai, Rahul P, PA-C   0.2 mg at 02/15/23 1554   clopidogrel (PLAVIX) tablet 75 mg  75 mg Oral QODAY Desai, Rahul P, PA-C   75 mg at 02/20/23 3664   cyanocobalamin (VITAMIN B12) tablet 2,500 mcg  2,500 mcg Oral Daily Desai, Rahul P,  PA-C   2,500 mcg at 02/20/23 0908   docusate sodium (COLACE) capsule 100 mg  100 mg Oral BID PRN Desai, Rahul P, PA-C       feeding supplement (ENSURE ENLIVE / ENSURE PLUS) liquid 237 mL  237 mL Oral BID BM Agarwala, Ravi, MD       heparin injection 5,000 Units  5,000 Units Subcutaneous Q8H Desai, Rahul P, PA-C   5,000 Units at 02/20/23 6962   hydrALAZINE (APRESOLINE) injection 5 mg  5 mg Intravenous Q6H PRN Regalado, Belkys A, MD       leflunomide (ARAVA) tablet 20 mg  20 mg Oral Daily Desai, Rahul P, PA-C   20 mg at 02/20/23 9528   levothyroxine (SYNTHROID) tablet 88 mcg  88 mcg Oral QAC breakfast Celine Mans, Rahul P, PA-C   88 mcg at 02/20/23 4132   loratadine (CLARITIN) tablet 10 mg  10 mg Oral Daily Lynnell Catalan, MD   10 mg at 02/20/23 4401   multivitamin with minerals tablet 1 tablet  1 tablet Oral Daily Celine Mans, Rahul P, PA-C   1 tablet at 02/20/23 0272   Oral care mouth rinse  15 mL Mouth Rinse PRN Lynnell Catalan, MD       pantoprazole (PROTONIX) EC tablet 40 mg  40 mg Oral Daily Desai, Rahul P, PA-C   40 mg at 02/20/23 0909    polyethylene glycol (MIRALAX / GLYCOLAX) packet 17 g  17 g Oral Daily PRN Celine Mans, Rahul P, PA-C       sertraline (ZOLOFT) tablet 25 mg  25 mg Oral Daily Desai, Rahul P, PA-C   25 mg at 02/20/23 5366   triamcinolone (NASACORT) nasal inhaler 1 spray  1 spray Nasal Daily Desai, Rahul P, PA-C   1 spray at 02/20/23 4403     Discharge Medications: Please see discharge summary for a list of discharge medications.  Relevant Imaging Results:  Relevant Lab Results:   Additional Information SSN: [id number]  [facility] Christain Sacramento, LCSW

## 2023-02-20 NOTE — Procedures (Signed)
Procedure: Right knee aspiration and injection   Indication: Right knee effusion(s)   Surgeon: Charma Igo, PA-C   Assist: None   Anesthesia: Topical refrigerant   EBL: None   Complications: None   Findings: After risks/benefits explained patient desires to undergo procedure. Consent obtained and time out performed. The right knee was sterilely prepped and aspirated. 70ml yellow then bloody fluid obtained. 6ml 0.5% Marcaine and 40mg  depo-medrol instilled. Pt tolerated the procedure well.       Freeman Caldron, PA-C Orthopedic Surgery [phone]

## 2023-02-20 NOTE — Consult Note (Signed)
Value-Based Care Institute  [facility] Inpatient Consult   02/20/2023  [name] Dec 04, 1940 [id number]  [facility] [[facility]]  Accountable Care Organization [ACO] Patient: [name]   Primary Care Provider:  [name], MD with [place] at [facility]   Patient is currently active with [facility] [[facility]] Care Management for care coordination services.  Patient has been engaged by a Horticulturist, commercial.  The community based plan of care has focused on disease management and community resource support.   Met with patient, Husband [name], and [name], son at the bedside to follow up on disposition and post hospital needs. They endorses ongoing follow up with Care Coordination team post SNF.  Patient will receive a post hospital call and will be evaluated for assessments and disease process education.    Plan: Current plan is for patient to go to [facility] SNF, from ILF there.     Of note, [facility] Care Management services does not replace or interfere with any services that are needed or arranged by inpatient [facility] care management team.   Sherry Shanks, RN, BSN, CCM [place]  [facility] Liaison Direct Dial: [phone] or secure chat Website: [name]

## 2023-02-20 NOTE — Progress Notes (Signed)
PROGRESS NOTE    [name]  TDD:[id number] DOB: [birth year] DOA: 02/15/2023 PCP: [name], MD   Brief Narrative: 82 year old with past medical history significant for CAD status post CABG type I, DVT PE on Plavix failed Eliquis due to recurrent GI bleed, COPD, CKD stage IV, RA, hypertension, hyperlipidemia who presents to the ED with altered mental status on 02/15/2023.  Last known well around 10 PM the night prior to admission.  She wake up at 430 the day of admission and was noted to be confused.  She was noted to be hypertensive.  Systolic blood pressure 244.  She was noted to have subtle left facial droop, left upper extremity weakness.  CT head no acute finding.  She received IV labetalol and was placed on Cleviprex drip.  Neurology was consulted. Patient was transition to oral medication, care transferred to [facility]ad 10/20.    Assessment & Plan:   Principal Problem:   Hypertensive urgency   1-Hypertensive emergency with hypertensive encephalopathy: -Blood pressure goal per Dr. Ollen Bowl notes will be around 150, due to patient history of orthostatic hypotension -MRI negative for acute findings -Received Cevipres in the ICU.  -EEG mild to moderate diffuse encephalopathy.  No seizures or epileptiform discharges were seen -Had Low BP. Norvasc and coreg dose reduce to 5 mg and 6.25. -Continue with Clonidine patch. Reduce dose to 0.1 as suggested by Nephrology starting Thursday.  Continue to monitor BP> less episode of Low BP.   Left facial droop likely from herpes zoster shingles resolving -Resolving.  -Schedule tylenol for pain.  -decline gabapentin.   Right knee pain, effusion.  X ray large effusion, suspicious for tibia fracture.  CT showed effusion, negative for fracture.  Ortho consulted. Underwent arthrocentesis 10/22. Synovial fluid with 23,000 WBC, monosodium urate crystal and intracellular calcium pyrophosphate. 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 Monitor.  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 Now also hypotension.  -Avoid hypotension.  Received IV fluids.  -Strict I's and O's -Renal ultrasound: No obstructive uropathy.  Renal parenchymal atrophy and finding of chronic medical renal disease Cr down to 4.9 Nephrology consulted.   UTI;  UA now with more than 50 WBC. Urine culture pending.  Start Ceftriaxone.   CAD status post CABG -Continue with Lipitor, carvedilol Plavix  Hyperlipidemia -Continue with Lipitor  Constipation: had small BM after sorbitol.    Rheumatoid arthritis History of DVT and PE but not on anticoagulation due to history of recurrent GI bleed    Estimated body mass index is 22.81 kg/m as calculated from the following:   Height as of this encounter: 5\' 3"  (1.6 m).   Weight as of this encounter: 58.4 kg.   DVT prophylaxis: Heparin Code Status: DNR Family Communication: Son   who was at bedside Disposition Plan:  Status is: Inpatient Remains inpatient appropriate because: Management of hypertension and AKI    Consultants:  CCM admitted patient  Procedures:  Renal ultrasound  Antimicrobials:    Subjective: She is sleepy on my evaluation. Per son this morning when he arrives she was alert and conversant. Not confuse. Then fell asleep.  She would wake up and answer questions approprietly. She relates she couldn't sleep last night.    Objective: Vitals:  02/19/23 1958 02/20/23 0603 02/20/23 0851 02/20/23 1153  BP: (!) 92/43 139/72 (!) 164/73 (!) 132/52  Pulse:  87 84 77  Resp: 16 18 18 16   Temp: 97.7 F (36.5 C) 98.2 F (36.8 C) 98.6 F (37 C)   TempSrc:  Oral Oral   SpO2: 98% 96%   92%  Weight:      Height:        Intake/Output Summary (Last 24 hours) at 02/20/2023 1404 Last data filed at 02/20/2023 0600 Gross per 24 hour  Intake 240 ml  Output 800 ml  Net -560 ml   Filed Weights   02/17/23 2039 02/18/23 0500 02/19/23 0733  Weight: 55.3 kg 55.6 kg 58.4 kg    Examination:  General exam: NAD Respiratory system: CTA Cardiovascular system: S 1, S 2 RRR Gastrointestinal system: BS present, soft nt Central nervous system: alert Extremities: BL weakness, contraction. Left knee swelling.    Data Reviewed: I have personally reviewed following labs and imaging studies  CBC: Recent Labs  Lab 02/15/23 0551 02/15/23 0635 02/16/23 0356 02/20/23 0358  WBC 9.4  --  10.6* 9.3  NEUTROABS 7.9*  --   --   --   HGB 12.8 15.0 12.6 10.5*  HCT 39.6 44.0 37.8 31.3*  MCV 100.8*  --  99.0 96.3  PLT 329  --  285 250   Basic Metabolic Panel: Recent Labs  Lab 02/15/23 0645 02/16/23 0356 02/18/23 0413 02/19/23 0508 02/20/23 0358  NA 131* 136 131* 129* 131*  K 3.9 3.8 3.6 3.7 3.7  CL 94* 98 95* 95* 95*  CO2 19* 19* 19* 19* 19*  GLUCOSE 163* 110* 107* 108* 95  BUN 90* 77* 79* 82* 84*  CREATININE 4.79* 4.28* 5.01* 5.09* 4.97*  CALCIUM 9.8 9.7 9.2 8.5* 8.9  MG  --  2.2  --   --   --   PHOS  --  5.7*  --   --   --    GFR: Estimated Creatinine Clearance: 7 mL/min (A) (by C-G formula based on SCr of 4.97 mg/dL (H)). Liver Function Tests: Recent Labs  Lab 02/15/23 0645  AST 24  ALT 17  ALKPHOS 81  BILITOT 0.9  PROT 7.0  ALBUMIN 3.6   No results for input(s): "LIPASE", "AMYLASE" in the last 168 hours. No results for input(s): "AMMONIA" in the last 168 hours. Coagulation Profile: Recent Labs  Lab 02/15/23 0645  INR 1.0   Cardiac Enzymes: No results for input(s): "CKTOTAL", "CKMB", "CKMBINDEX", "TROPONINI" in the last 168 hours. BNP (last 3 results) No results for input(s): "PROBNP" in the last 8760 hours. HbA1C: No results for input(s): "HGBA1C"  in the last 72 hours. CBG: Recent Labs  Lab 02/15/23 1517 02/15/23 1915 02/15/23 2316 02/16/23 0328 02/16/23 0720  GLUCAP 110* 98 100* 104* 117*   Lipid Profile: No results for input(s): "CHOL", "HDL", "LDLCALC", "TRIG", "CHOLHDL", "LDLDIRECT" in the last 72 hours.  Thyroid Function Tests: No results for input(s): "TSH", "T4TOTAL", "FREET4", "T3FREE", "THYROIDAB" in the last 72 hours. Anemia Panel: No results for input(s): "VITAMINB12", "FOLATE", "FERRITIN", "TIBC", "IRON", "RETICCTPCT" in the last 72 hours. Sepsis Labs: No results for input(s): "PROCALCITON", "LATICACIDVEN" in the last 168 hours.  Recent Results (from the past 240 hour(s))  MRSA Next Gen by PCR, Nasal     Status: None   Collection Time: 02/15/23 11:33 AM   Specimen: Nasal Mucosa; Nasal Swab  Result Value Ref Range Status   MRSA by PCR Next Gen NOT DETECTED  NOT DETECTED Final    Comment: (NOTE) The GeneXpert MRSA Assay (FDA approved for NASAL specimens only), is one component of a comprehensive MRSA colonization surveillance program. It is not intended to diagnose MRSA infection nor to guide or monitor treatment for MRSA infections. Test performance is not FDA approved in patients less than 91 years old. Performed at [facility], [address], [place], [place] [address]          Radiology Studies: CT KNEE RIGHT WO CONTRAST  Result Date: 02/19/2023 CLINICAL DATA:  Knee pain EXAM: CT OF THE RIGHT KNEE WITHOUT CONTRAST TECHNIQUE: Multidetector CT imaging of the right knee was performed according to the standard protocol. Multiplanar CT image reconstructions were also generated. RADIATION DOSE REDUCTION: This exam was performed according to the departmental dose-optimization program which includes automated exposure control, adjustment of the mA and/or kV according to patient size and/or use of iterative reconstruction technique. COMPARISON:  02/18/2023 radiographs FINDINGS: Bones/Joint/Cartilage  Meniscal chondrocalcinosis. Large knee joint effusion. Cortical thickening posterolaterally in the lateral tibial plateau and posterolaterally along the lateral femoral condyle, possibly degenerative although subcortical stress fractures are not excluded. There is some widening of the lateral compartment posteriorly raise the possibility of a prior compression of the lateral tibial plateau but no well-defined acute cortical fracture is identified. Tricompartmental marginal spurring noted. Ligaments Suboptimally assessed by CT. Muscles and Tendons Unremarkable Soft tissues Moderate-sized Baker's cyst. Distal SFA and popliteal artery atheromatous vascular calcification. Subcutaneous edema anterior to the junction of the quadriceps tendon and lateral patellar retinaculum. IMPRESSION: 1. Large knee joint effusion. 2. Cortical thickening posterolaterally in the lateral tibial plateau and posterolaterally along the lateral femoral condyle, possibly degenerative although subcortical stress fractures are not excluded. There is some widening of the lateral compartment posteriorly raise the possibility of a prior compression of the lateral tibial plateau but no well-defined acute cortical fracture is identified. 3. 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 4. Moderate-sized Baker's cyst. 5. Subcutaneous edema anterior to the junction of the quadriceps tendon and lateral patellar retinaculum. 6. Distal SFA and popliteal artery atheromatous vascular calcification. Electronically Signed   By: Gaylyn Rong M.D.   On: 02/19/2023 13:33   DG Knee 1-2 Views Right  Result Date: 02/18/2023 CLINICAL DATA:  Right knee pain and swelling following fall, initial encounter EXAM: RIGHT KNEE - 2 VIEW COMPARISON:  08/02/2022 FINDINGS: Tricompartmental degenerative changes are noted. There is suggestion of mildly displaced fracture through the intercondylar eminence of the proximal tibia best seen on the  lateral projection. Cross-sectional imaging may be helpful for further evaluation. Large suprapatellar joint effusion is seen. IMPRESSION: Large joint effusion with findings suspicious for proximal tibial fracture. CT may be helpful when clinically able. Electronically Signed   By: Alcide Clever M.D.   On: 02/18/2023 22:12        Scheduled Meds:  acetaminophen  500 mg Oral TID   amLODipine  5 mg Oral Daily   ascorbic acid  500 mg Oral Daily   atorvastatin  80 mg Oral Daily   carvedilol  6.25 mg Oral BID WC   Chlorhexidine Gluconate Cloth  6 each Topical Daily   cholecalciferol  2,000 Units Oral Daily   [START ON 02/22/2023] cloNIDine  0.1 mg Transdermal Weekly   clopidogrel  75 mg Oral QODAY   cyanocobalamin  2,500 mcg Oral Daily   feeding supplement  237 mL Oral BID BM   heparin  5,000 Units Subcutaneous Q8H   leflunomide  20  mg Oral Daily   levothyroxine  88 mcg Oral QAC breakfast   loratadine  10 mg Oral Daily   multivitamin with minerals  1 tablet Oral Daily   pantoprazole  40 mg Oral Daily   sertraline  25 mg Oral Daily   triamcinolone  1 spray Nasal Daily   Continuous Infusions:  cefTRIAXone (ROCEPHIN)  IV       LOS: 5 days    Time spent: 35 minutes.     Alba Cory, MD [facility]   If 7PM-7AM, please contact night-coverage [email or web address]  02/20/2023, 2:04 PM

## 2023-02-20 NOTE — Progress Notes (Signed)
Speech Language Pathology Treatment: Dysphagia  Patient Details Name: [name] MRN: [id number] DOB: May 27, 1940 Today's Date: 02/20/2023 Time: 4098-1191 SLP Time Calculation (min) (ACUTE ONLY): 8 min  Assessment / Plan / Recommendation Clinical Impression  Pt presents with seemingly improved mentation. Observed pt with regular texture solids and thin liquids with one instance of a delayed throat clear, although she achieves complete oral clearance. Given presentation previous date, suspect delayed throat clear may be due to esophageal factors. Discussed options for diet upgrade with pt stating she prefers small, bite-sized pieces. Recommend continuing current diet of Dys 3 texture solids with thin liquids. No further SLP f/u is necessary at this time, although pt will likely continue to benefit from assistance with feeding.    HPI HPI: [name] is an 82 yo female presenting to ED 10/17 with AMS and L facial droop. CTH and MRI Brain negative. EEG suggestive of mild to moderate encephalopathy. PMH includes CAD s/p CABG, COPD, CKD IV, HTN, HLD      SLP Plan  All goals met      Recommendations for follow up therapy are one component of a multi-disciplinary discharge planning process, led by the attending physician.  Recommendations may be updated based on patient status, additional functional criteria and insurance authorization.    Recommendations  Diet recommendations: Dysphagia 3 (mechanical soft);Thin liquid Liquids provided via: Cup;Straw Medication Administration: Whole meds with liquid Supervision: Staff to assist with self feeding Compensations: Slow rate;Small sips/bites Postural Changes and/or Swallow Maneuvers: Seated upright 90 degrees                  Oral care BID;Staff/trained caregiver to provide oral care   Set up Supervision/Assistance Dysphagia, unspecified (R13.10)     All goals met     Gwynneth Aliment, M.A., CF-SLP Speech Language Pathology,  Acute Rehabilitation Services  Secure Chat preferred [phone]   02/20/2023, 12:16 PM

## 2023-02-21 ENCOUNTER — Ambulatory Visit: Payer: Self-pay

## 2023-02-21 DIAGNOSIS — I160 Hypertensive urgency: Secondary | ICD-10-CM | POA: Diagnosis not present

## 2023-02-21 LAB — CBC
HCT: 32.6 % — ABNORMAL LOW (ref 36.0–46.0)
Hemoglobin: 10.8 g/dL — ABNORMAL LOW (ref 12.0–15.0)
MCH: 32.2 pg (ref 26.0–34.0)
MCHC: 33.1 g/dL (ref 30.0–36.0)
MCV: 97.3 fL (ref 80.0–100.0)
Platelets: 261 10*3/uL (ref 150–400)
RBC: 3.35 MIL/uL — ABNORMAL LOW (ref 3.87–5.11)
RDW: 17.2 % — ABNORMAL HIGH (ref 11.5–15.5)
WBC: 8.9 10*3/uL (ref 4.0–10.5)
nRBC: 2.5 % — ABNORMAL HIGH (ref 0.0–0.2)

## 2023-02-21 LAB — BASIC METABOLIC PANEL
Anion gap: 13 (ref 5–15)
BUN: 90 mg/dL — ABNORMAL HIGH (ref 8–23)
CO2: 18 mmol/L — ABNORMAL LOW (ref 22–32)
Calcium: 8.5 mg/dL — ABNORMAL LOW (ref 8.9–10.3)
Chloride: 96 mmol/L — ABNORMAL LOW (ref 98–111)
Creatinine, Ser: 4.82 mg/dL — ABNORMAL HIGH (ref 0.44–1.00)
GFR, Estimated: 8 mL/min — ABNORMAL LOW (ref >60)
Glucose, Bld: 121 mg/dL — ABNORMAL HIGH (ref 70–99)
Potassium: 4.3 mmol/L (ref 3.5–5.1)
Sodium: 127 mmol/L — ABNORMAL LOW (ref 135–145)

## 2023-02-21 LAB — SODIUM, URINE, RANDOM: Sodium, Ur: 29 mmol/L

## 2023-02-21 LAB — OSMOLALITY: Osmolality: 309 mosm/kg — ABNORMAL HIGH (ref 275–295)

## 2023-02-21 LAB — OSMOLALITY, URINE: Osmolality, Ur: 214 mosm/kg — ABNORMAL LOW (ref 300–900)

## 2023-02-21 MED ORDER — CARVEDILOL 6.25 MG PO TABS
6.2500 mg | ORAL_TABLET | Freq: Two times a day (BID) | ORAL | Status: DC
  Administered 2023-02-21 – 2023-02-26 (×11): 6.25 mg via ORAL
  Filled 2023-02-21 (×12): qty 1

## 2023-02-21 MED ORDER — METOPROLOL TARTRATE 5 MG/5ML IV SOLN: 5.0000 mg | INTRAVENOUS | Status: DC | PRN

## 2023-02-21 MED ORDER — DOCUSATE SODIUM 100 MG PO CAPS
100.0000 mg | ORAL_CAPSULE | Freq: Two times a day (BID) | ORAL | Status: DC
Start: 2023-02-21 — End: 2023-02-27
  Administered 2023-02-21 – 2023-02-26 (×11): 100 mg via ORAL
  Filled 2023-02-21 (×12): qty 1

## 2023-02-21 MED ORDER — ONDANSETRON HCL 4 MG/2ML IJ SOLN: 4.0000 mg | Freq: Four times a day (QID) | INTRAMUSCULAR | Status: DC | PRN

## 2023-02-21 MED ORDER — HYDRALAZINE HCL 20 MG/ML IJ SOLN
10.0000 mg | INTRAMUSCULAR | Status: DC | PRN
  Administered 2023-02-21 – 2023-02-26 (×5): 10 mg via INTRAVENOUS
  Filled 2023-02-21 (×5): qty 1

## 2023-02-21 MED ORDER — IPRATROPIUM-ALBUTEROL 0.5-2.5 (3) MG/3ML IN SOLN: 3.0000 mL | RESPIRATORY_TRACT | Status: DC | PRN

## 2023-02-21 MED ORDER — AMLODIPINE BESYLATE 5 MG PO TABS
5.0000 mg | ORAL_TABLET | Freq: Every day | ORAL | Status: DC
  Administered 2023-02-21 – 2023-02-22 (×2): 5 mg via ORAL
  Filled 2023-02-21 (×2): qty 1

## 2023-02-21 NOTE — Progress Notes (Signed)
[facility] Progress Note   82 y.o. 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 SBP  244 by EMS and left facial droop and LUE weakness. CT negative for bleed and the patient was given IV labetalol and started on a Cleviprex gtt in the ED. BUN/Cr was 90/4.8 in the ED with a negative UDS. She did have a fall a few days prior to admission with head trauma but again CT neg for bleed.  Cr  had decr from 5.1 -> nadir of 4.28 during this hospitalization before increasing to 5.09 but her baseline cr appears to be in the 3.2-3.7 range followed by Dr. Vallery Sa with [facility].   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 Vallery Sa with [facility].  BP dropped as low as 94/41 on 10/19, 90/32 on 10/20 but otherwise running in the 159-170's systolic range. Initially renal function was improving but then trended up which is likely related to the hypotensive episodes with systolics dropping at times to low 90's.  Urine protein >300. Weight has decr from 59kg to 54kg. UOP 2.7L /24hrs earlier in the hospitalzation but has dropped to oliguric range. Renal ultrasound shows small kidneys with low volumes, incr echogenicity but no e/o obstruction. 114/54 morning of consultation. - antihypertensives being titrated to hopefully prevent episodes of hypotension. Currently on Amlodipine 5mg  daily, carvedilol 6.25 BID, Catapres patch 0.2 on Thursdays, hydralazine PRN. Still having drops in BP but seem to be fewer episodes.   Just very labile BP; will continue to trend. Very high this AM before medications.  Difficult to manage with occasional 1-2x/ day drops in blood pressure which is affecting renal recovery. But she is a lot more alert this AM. Catapres 0.2 patch removed evening of 10/22 which likely explains the higher BP today. Titrate up on amlodipine as tolerated. Her mental status has improved with the  higher blood pressures.  Na dropped and I wonder if it's because she's more alert now and drinking. Will trend for now; if it drops more will need to fluid restrict.  - agree with management and holding the ARB with AKI.  - will follow closely with you; even systolics in the 140-160's is fine and we can slowly titrate up on her antihypertensives as needed as an outpt.   - Avoid nephrotoxic medications including NSAIDs and iodinated intravenous contrast exposure unless the latter is absolutely indicated.   - Preferred narcotic agents for pain control are hydromorphone, fentanyl, and methadone. Morphine should not be used.  - Avoid Baclofen and avoid oral sodium phosphate and magnesium citrate based laxatives / bowel preps.  - Continue strict Input and Output monitoring. Will monitor the patient closely with you and intervene or adjust therapy as indicated by changes in clinical status/labs   AMS - had improved, more somnolent today but easily arousable. Hypertensive crisis initially treated with Labetolol and Cleviprex gtt initially now being managed with Catapres patch 0.2, amlodipine 5 and carvedilol 6.25 BID with prn hydralazine. Herpes zoster tx valcyte CASHD s/p CABG - no chest pain or signs of ischemia. RA  Subjective:   More awake today; conversing and oriented. Denies f/c/n/v/sob/ ha. She is eating.   Objective:   BP (!) 205/126 (BP Location: Right Arm)   Pulse 96   Temp 98.2 F (36.8 C)   Resp 16   Ht 5\' 3"  (1.6 m)   Wt  58.4 kg   SpO2 93%   BMI 22.81 kg/m   Intake/Output Summary (Last 24 hours) at 02/21/2023 0755 Last data filed at 02/20/2023 1310 Gross per 24 hour  Intake 120 ml  Output --  Net 120 ml   Weight change:   Physical Exam: General appearance: NAD, somnolent Head: NCAT Neck: no JVD, supple Back: symmetric Resp: CTA b/l Cardio: RRR GI: SNDNT +BS Extremities: no edema Pulses: 2+ and symmetric Skin: mild erythema left side of face  Imaging: CT  KNEE RIGHT WO CONTRAST  Result Date: 02/19/2023 CLINICAL DATA:  Knee pain EXAM: CT OF THE RIGHT KNEE WITHOUT CONTRAST TECHNIQUE: Multidetector CT imaging of the right knee was performed according to the standard protocol. Multiplanar CT image reconstructions were also generated. RADIATION DOSE REDUCTION: This exam was performed according to the departmental dose-optimization program which includes automated exposure control, adjustment of the mA and/or kV according to patient size and/or use of iterative reconstruction technique. COMPARISON:  02/18/2023 radiographs FINDINGS: Bones/Joint/Cartilage Meniscal chondrocalcinosis. Large knee joint effusion. Cortical thickening posterolaterally in the lateral tibial plateau and posterolaterally along the lateral femoral condyle, possibly degenerative although subcortical stress fractures are not excluded. There is some widening of the lateral compartment posteriorly raise the possibility of a prior compression of the lateral tibial plateau but no well-defined acute cortical fracture is identified. Tricompartmental marginal spurring noted. Ligaments Suboptimally assessed by CT. Muscles and Tendons Unremarkable Soft tissues Moderate-sized Baker's cyst. Distal SFA and popliteal artery atheromatous vascular calcification. Subcutaneous edema anterior to the junction of the quadriceps tendon and lateral patellar retinaculum. IMPRESSION: 1. Large knee joint effusion. 2. Cortical thickening posterolaterally in the lateral tibial plateau and posterolaterally along the lateral femoral condyle, possibly degenerative although subcortical stress fractures are not excluded. There is some widening of the lateral compartment posteriorly raise the possibility of a prior compression of the lateral tibial plateau but no well-defined acute cortical fracture is identified. 3. 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 4. Moderate-sized Baker's cyst. 5. Subcutaneous  edema anterior to the junction of the quadriceps tendon and lateral patellar retinaculum. 6. Distal SFA and popliteal artery atheromatous vascular calcification. Electronically Signed   By: Gaylyn Rong M.D.   On: 02/19/2023 13:33    Labs: BMET Recent Labs  Lab 02/15/23 5284 02/15/23 0645 02/16/23 0356 02/18/23 0413 02/19/23 0508 02/20/23 0358 02/21/23 0416  NA 127* 131* 136 131* 129* 131* 127*  K 6.8* 3.9 3.8 3.6 3.7 3.7 4.3  CL 100 94* 98 95* 95* 95* 96*  CO2  --  19* 19* 19* 19* 19* 18*  GLUCOSE 159* 163* 110* 107* 108* 95 121*  BUN 129* 90* 77* 79* 82* 84* 90*  CREATININE 5.10* 4.79* 4.28* 5.01* 5.09* 4.97* 4.82*  CALCIUM  --  9.8 9.7 9.2 8.5* 8.9 8.5*  PHOS  --   --  5.7*  --   --   --   --    CBC Recent Labs  Lab 02/15/23 0551 02/15/23 0635 02/16/23 0356 02/20/23 0358 02/21/23 0416  WBC 9.4  --  10.6* 9.3 8.9  NEUTROABS 7.9*  --   --   --   --   HGB 12.8 15.0 12.6 10.5* 10.8*  HCT 39.6 44.0 37.8 31.3* 32.6*  MCV 100.8*  --  99.0 96.3 97.3  PLT 329  --  285 250 261    Medications:     acetaminophen  500 mg Oral TID   allopurinol  100 mg Oral Daily   amLODipine  5 mg Oral Daily   ascorbic acid  500 mg Oral Daily   atorvastatin  80 mg Oral Daily   carvedilol  6.25 mg Oral BID WC   Chlorhexidine Gluconate Cloth  6 each Topical Daily   cholecalciferol  2,000 Units Oral Daily   [START ON 02/22/2023] cloNIDine  0.1 mg Transdermal Weekly   clopidogrel  75 mg Oral QODAY   cyanocobalamin  2,500 mcg Oral Daily   feeding supplement  237 mL Oral BID BM   heparin  5,000 Units Subcutaneous Q8H   leflunomide  20 mg Oral Daily   levothyroxine  88 mcg Oral QAC breakfast   loratadine  10 mg Oral Daily   multivitamin with minerals  1 tablet Oral Daily   pantoprazole  40 mg Oral Daily   senna  1 tablet Oral Daily   sertraline  25 mg Oral Daily   triamcinolone  1 spray Nasal Daily      Paulene Floor, MD 02/21/2023, 7:55 AM

## 2023-02-21 NOTE — Patient Outreach (Signed)
Care Coordination   Multidisciplinary Case Review Note    02/21/2023 Name: VAUDINE PRAIRIE MRN: [id number] DOB: 1940/09/06  KEIDRA DOOSE is a 82 y.o. year old female who sees Plotnikov, Georgina Quint, MD for primary care.  The  multidisciplinary care team met today to review patient care needs and barriers.  Patient currently admitted to hospital for HTN urgency.  Goals Addressed             This Visit's Progress    Care Coordination Activities       Interventions Today    Flowsheet Row Most Recent Value  General Interventions   General Interventions Discussed/Reviewed Communication with  Communication with Social Work  [Multidisciplinary Case discussion completed]            SDOH assessments and interventions completed:  No  Care Coordination Interventions Activated:  Yes   Care Coordination Interventions:  Yes, provided   Follow up plan:  Collaboration with Hospital Liaison. RNCM and LCSW will continue to follow and assist with discharge planning as needed    Multidisciplinary Team Attendees:   Kathyrn Sheriff, RNCM; Lorna Few, LCSW  Scribe for Multidisciplinary Case Review:  Kathyrn Sheriff, [facility]

## 2023-02-21 NOTE — Progress Notes (Signed)
Occupational Therapy Treatment Patient Details Name: [name] MRN: [id number] DOB: [birth year] Today's Date: 02/21/2023   History of present illness 82 y.o. female presents to [facility] hospital on 10/17 with altered mental status with associated hypertensive crisis. AKI on CKD stage IV. MI negative for acute findings, found to have herpes zoster shingles incidentally. 10/22 s/p R knee aspiration and steroid injection by orthopedic PA. PMH includes CKD IV, HTN, RA, hypothyroidism, CABG, COPD, HLD, PE.   OT comments  Pt tired upon arrival, fell to sleep every few seconds. Pt became fully alert once sitting on EOB, able to fully participate, conversational. Pt BP high initially in supine, lowered during activity, see "general comments" below. Pt able to complete ADLs at bedside with set up, good activity tolerance and sitting balance. Pt able to ambulate around room with increased time, CGA, no LOB. Pt min A for LB ADLs, improving well. Pt would benefit from continued acute OT to ensure functional improvements are consistent to maximize safety and independence prior to return home, DC plan still appropriate.       If plan is discharge home, recommend the following:  A lot of help with walking and/or transfers;A lot of help with bathing/dressing/bathroom;Assist for transportation;Help with stairs or ramp for entrance;Assistance with cooking/housework   Equipment Recommendations  Other (comment)    Recommendations for Other Services      Precautions / Restrictions Precautions Precautions: Fall Precaution Comments: fall and hit head prior to admission; SBP goal <150 per MD (permissive HTN due to recent orthostatic hypotension) Restrictions Weight Bearing Restrictions: No       Mobility Bed Mobility Overal bed mobility: Needs Assistance Bed Mobility: Supine to Sit     Supine to sit: Min assist, HOB elevated, Used rails     General bed mobility comments: min A for power to sit up with use  of rails    Transfers Overall transfer level: Needs assistance Equipment used: Rolling walker (2 wheels) Transfers: Sit to/from Stand, Bed to chair/wheelchair/BSC Sit to Stand: Contact guard assist     Step pivot transfers: Contact guard assist     General transfer comment: Pt CGA today, no LOB, Pt takes her time and displays good safety awareness.     Balance Overall balance assessment: Needs assistance Sitting-balance support: No upper extremity supported, Feet supported Sitting balance-Leahy Scale: Good Sitting balance - Comments: sitting EOB performing ADLs for 20+ mins   Standing balance support: Bilateral upper extremity supported, During functional activity, Reliant on assistive device for balance Standing balance-Leahy Scale: Fair Standing balance comment: Pt able to stand and manage toileting hygiene using 1 hand support of RW, no LOB                           ADL either performed or assessed with clinical judgement   ADL Overall ADL's : Needs assistance/impaired Eating/Feeding: Set up;Sitting       Upper Body Bathing: Set up;Sitting       Upper Body Dressing : Set up;Sitting   Lower Body Dressing: Minimal assistance;Sit to/from stand   Toilet Transfer: Contact guard assist;Ambulation;Rolling walker (2 wheels)   Toileting- Clothing Manipulation and Hygiene: Minimal assistance;Sit to/from stand       Functional mobility during ADLs: Contact guard assist;Rolling walker (2 wheels) General ADL Comments: Pt doing well, min A for LB ADLs, set up for UB ADLs. CGA for transfers/mobility.    Extremity/Trunk Assessment Upper Extremity Assessment Upper Extremity Assessment: Overall  WFL for tasks assessed            Vision       Perception     Praxis      Cognition Arousal: Alert Behavior During Therapy: WFL for tasks assessed/performed Overall Cognitive Status: Within Functional Limits for tasks assessed                                  General Comments: Pt lethargic when in bed, tired, groggy, but once sitting up became fully alert and conversational.        Exercises      Shoulder Instructions       General Comments BP supine 174/67, sitting 129/75, standing 109/57.  Pt at end of session 150/71 sitting in recliner.    Pertinent Vitals/ Pain       Pain Assessment Pain Assessment: No/denies pain  Home Living                                          Prior Functioning/Environment              Frequency  Min 1X/week        Progress Toward Goals  OT Goals(current goals can now be found in the care plan section)  Progress towards OT goals: Progressing toward goals  Acute Rehab OT Goals Patient Stated Goal: to improve strength/endurance OT Goal Formulation: With patient Time For Goal Achievement: 03/05/23 Potential to Achieve Goals: Good ADL Goals Pt Will Perform Lower Body Dressing: with set-up;sit to/from stand Pt Will Transfer to Toilet: with supervision;bedside commode Pt Will Perform Toileting - Clothing Manipulation and hygiene: with modified independence Additional ADL Goal #1: Pt will be able to complete bed mobility with mod I to maximize indpendence and reduce caregiver burden/return to PLOF.  Plan      Co-evaluation                 AM-PAC OT "6 Clicks" Daily Activity     Outcome Measure   Help from another person eating meals?: A Little Help from another person taking care of personal grooming?: A Little Help from another person toileting, which includes using toliet, bedpan, or urinal?: A Lot Help from another person bathing (including washing, rinsing, drying)?: A Little Help from another person to put on and taking off regular upper body clothing?: A Little Help from another person to put on and taking off regular lower body clothing?: A Little 6 Click Score: 17    End of Session Equipment Utilized During Treatment: Gait belt;Rolling  walker (2 wheels)  OT Visit Diagnosis: Unsteadiness on feet (R26.81);Other abnormalities of gait and mobility (R26.89);Repeated falls (R29.6);Muscle weakness (generalized) (M62.81);Pain Pain - Right/Left: Right Pain - part of body: Knee   Activity Tolerance Patient tolerated treatment well   Patient Left in chair;with call bell/phone within reach   Nurse Communication Mobility status        Time: 4132-4401 OT Time Calculation (min): 29 min  Charges: OT General Charges $OT Visit: 1 Visit OT Treatments $Self Care/Home Management : 23-37 mins  Samak, OTR/L   Alexis Goodell 02/21/2023, 3:37 PM

## 2023-02-21 NOTE — Progress Notes (Signed)
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  Last known well around 10 PM the night prior to admission.  She wake up at 430 the day of admission and was noted to be confused.  She was noted to be hypertensive.  Systolic blood pressure 244.  She was noted to have subtle left facial droop, left upper extremity weakness.  CT head no acute finding.  She received IV labetalol and was placed on Cleviprex drip.  Neurology was consulted. Patient was transition to oral medication, care transferred to triad 10/20.  Since then blood pressure has remained quite labile.       Assessment & Plan:   Principal Problem:   Hypertensive urgency     Hypertensive emergency with hypertensive encephalopathy Uncontrolled hypertension, labile Goal SBP around 150.  MRI brain negative.  EEG is negative. Current blood pressure regimen-Norvasc 5 mg daily, Coreg 6.25 mg twice daily, clonidine 0.1 mg weekly Compression stocking ordered.    Left facial droop likely from herpes zoster shingles resolving Currently on scheduled Tylenol   Right knee pain, effusion.  X ray large effusion, suspicious for tibia fracture.  CT is negative for any fracture, Ortho performed an arthrocentesis 10/22.  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  Received intra-articular steroid, started allopurinol   Hyponatremia;  Sodium is again 127.  Will check urine electrolytes and osmolality   Hyperkalemia:  Closely monitor   Acute kidney injury on CKD stage IV: -Cr baseline 3.2--3.5.  Peaked at 5.1.  Labile BP likely is playing a role.  Hopefully we can stabilize blood pressure more.  Renal  ultrasound is negative.  Nephrology is following.   UTI UA now with more than 50 WBC. Urine culture pending.  Started Rocephin, will opt for total 3-day course   CAD status post CABG Continue Plavix, Coreg, statin   Hyperlipidemia Lipitor   Constipation:  Bowel regimen     Rheumatoid arthritis History of DVT and PE but not on anticoagulation due to history of recurrent GI bleed       Estimated body mass index is 22.81 kg/m as calculated from the following:   Height as of this encounter: 5\' 3"  (1.6 m).   Weight as of this encounter: 58.4 kg.     DVT prophylaxis: Heparin Code Status: DNR Family Communication:  Disposition Plan:  Status is: Inpatient Remains inpatient appropriate because: Management of hypertension and AKI          Subjective: Seen at bedside, no new complaints.  Tells me she wants to ambulate more. Awake and alert.  Family at bedside.  Examination:  General exam: Appears calm and comfortable,  frail Respiratory system: Clear to auscultation. Respiratory effort normal. Cardiovascular system: S1 & S2 heard, RRR. No JVD, murmurs, rubs, gallops or clicks. No pedal edema. Gastrointestinal system: Abdomen is nondistended, soft and nontender. No organomegaly or masses felt. Normal bowel sounds heard. Central nervous system: Alert and oriented. No focal neurological deficits. Extremities: Symmetric 5 x 5 power. Skin: No rashes, lesions or ulcers Psychiatry: Judgement and insight appear normal. Mood & affect appropriate.       Diet Orders (From admission, onward)  Start     Ordered   02/20/23 1221  DIET DYS 3 Room service appropriate? Yes with Assist; Fluid consistency: Thin  Diet effective now       Question Answer Comment  Room service appropriate? Yes with Assist   Fluid consistency: Thin      02/20/23 1220            Objective: Vitals:   02/20/23 2017 02/21/23 0352 02/21/23 0609 02/21/23 0803  BP: (!) 153/72 (!) 193/63 (!)  205/126 (!) 177/65  Pulse: 78 82 96 94  Resp: 16 16  17   Temp: 97.6 F (36.4 C) 98.2 F (36.8 C)  97.9 F (36.6 C)  TempSrc: Oral     SpO2: 92% 93% 93% 94%  Weight:      Height:        Intake/Output Summary (Last 24 hours) at 02/21/2023 0837 Last data filed at 02/21/2023 0800 Gross per 24 hour  Intake 120 ml  Output 400 ml  Net -280 ml   Filed Weights   02/17/23 2039 02/18/23 0500 02/19/23 0733  Weight: 55.3 kg 55.6 kg 58.4 kg    Scheduled Meds:  acetaminophen  500 mg Oral TID   allopurinol  100 mg Oral Daily   amLODipine  5 mg Oral Daily   ascorbic acid  500 mg Oral Daily   atorvastatin  80 mg Oral Daily   carvedilol  6.25 mg Oral BID WC   Chlorhexidine Gluconate Cloth  6 each Topical Daily   cholecalciferol  2,000 Units Oral Daily   [START ON 02/22/2023] cloNIDine  0.1 mg Transdermal Weekly   clopidogrel  75 mg Oral QODAY   cyanocobalamin  2,500 mcg Oral Daily   docusate sodium  100 mg Oral BID   feeding supplement  237 mL Oral BID BM   heparin  5,000 Units Subcutaneous Q8H   leflunomide  20 mg Oral Daily   levothyroxine  88 mcg Oral QAC breakfast   loratadine  10 mg Oral Daily   multivitamin with minerals  1 tablet Oral Daily   pantoprazole  40 mg Oral Daily   senna  1 tablet Oral Daily   sertraline  25 mg Oral Daily   triamcinolone  1 spray Nasal Daily   Continuous Infusions:  cefTRIAXone (ROCEPHIN)  IV 1 g (02/20/23 1514)    Nutritional status     Body mass index is 22.81 kg/m.  Data Reviewed:   CBC: Recent Labs  Lab 02/15/23 0551 02/15/23 0635 02/16/23 0356 02/20/23 0358 02/21/23 0416  WBC 9.4  --  10.6* 9.3 8.9  NEUTROABS 7.9*  --   --   --   --   HGB 12.8 15.0 12.6 10.5* 10.8*  HCT 39.6 44.0 37.8 31.3* 32.6*  MCV 100.8*  --  99.0 96.3 97.3  PLT 329  --  285 250 261   Basic Metabolic Panel: Recent Labs  Lab 02/16/23 0356 02/18/23 0413 02/19/23 0508 02/20/23 0358 02/21/23 0416  NA 136 131* 129* 131* 127*  K 3.8 3.6 3.7 3.7 4.3   CL 98 95* 95* 95* 96*  CO2 19* 19* 19* 19* 18*  GLUCOSE 110* 107* 108* 95 121*  BUN 77* 79* 82* 84* 90*  CREATININE 4.28* 5.01* 5.09* 4.97* 4.82*  CALCIUM 9.7 9.2 8.5* 8.9 8.5*  MG 2.2  --   --   --   --   PHOS 5.7*  --   --   --   --    GFR: Estimated  Creatinine Clearance: 7.2 mL/min (A) (by C-G formula based on SCr of 4.82 mg/dL (H)). Liver Function Tests: Recent Labs  Lab 02/15/23 0645  AST 24  ALT 17  ALKPHOS 81  BILITOT 0.9  PROT 7.0  ALBUMIN 3.6   No results for input(s): "LIPASE", "AMYLASE" in the last 168 hours. No results for input(s): "AMMONIA" in the last 168 hours. Coagulation Profile: Recent Labs  Lab 02/15/23 0645  INR 1.0   Cardiac Enzymes: No results for input(s): "CKTOTAL", "CKMB", "CKMBINDEX", "TROPONINI" in the last 168 hours. BNP (last 3 results) No results for input(s): "PROBNP" in the last 8760 hours. HbA1C: No results for input(s): "HGBA1C" in the last 72 hours. CBG: Recent Labs  Lab 02/15/23 1517 02/15/23 1915 02/15/23 2316 02/16/23 0328 02/16/23 0720  GLUCAP 110* 98 100* 104* 117*   Lipid Profile: No results for input(s): "CHOL", "HDL", "LDLCALC", "TRIG", "CHOLHDL", "LDLDIRECT" in the last 72 hours. Thyroid Function Tests: No results for input(s): "TSH", "T4TOTAL", "FREET4", "T3FREE", "THYROIDAB" in the last 72 hours. Anemia Panel: No results for input(s): "VITAMINB12", "FOLATE", "FERRITIN", "TIBC", "IRON", "RETICCTPCT" in the last 72 hours. Sepsis Labs: No results for input(s): "PROCALCITON", "LATICACIDVEN" in the last 168 hours.  Recent Results (from the past 240 hour(s))  MRSA Next Gen by PCR, Nasal     Status: None   Collection Time: 02/15/23 11:33 AM   Specimen: Nasal Mucosa; Nasal Swab  Result Value Ref Range Status   MRSA by PCR Next Gen NOT DETECTED NOT DETECTED Final    Comment: (NOTE) The GeneXpert MRSA Assay (FDA approved for NASAL specimens only), is one component of a comprehensive MRSA colonization  surveillance program. It is not intended to diagnose MRSA infection nor to guide or monitor treatment for MRSA infections. Test performance is not FDA approved in patients less than 57 years old. Performed at [facility], [address], [place], [place] [address]   Body fluid culture w Gram Stain     Status: None (Preliminary result)   Collection Time: 02/19/23  1:45 PM   Specimen: Synovium; Body Fluid  Result Value Ref Range Status   Specimen Description SYNOVIAL  Final   Special Requests right knee  Final   Gram Stain   Final    FEW WBC PRESENT, PREDOMINANTLY PMN NO ORGANISMS SEEN Performed at [facility], [address], [place], [place] [address]    Culture PENDING  Incomplete   Report Status PENDING  Incomplete         Radiology Studies: CT KNEE RIGHT WO CONTRAST  Result Date: 02/19/2023 CLINICAL DATA:  Knee pain EXAM: CT OF THE RIGHT KNEE WITHOUT CONTRAST TECHNIQUE: Multidetector CT imaging of the right knee was performed according to the standard protocol. Multiplanar CT image reconstructions were also generated. RADIATION DOSE REDUCTION: This exam was performed according to the departmental dose-optimization program which includes automated exposure control, adjustment of the mA and/or kV according to patient size and/or use of iterative reconstruction technique. COMPARISON:  02/18/2023 radiographs FINDINGS: Bones/Joint/Cartilage Meniscal chondrocalcinosis. Large knee joint effusion. Cortical thickening posterolaterally in the lateral tibial plateau and posterolaterally along the lateral femoral condyle, possibly degenerative although subcortical stress fractures are not excluded. There is some widening of the lateral compartment posteriorly raise the possibility of a prior compression of the lateral tibial plateau but no well-defined acute cortical fracture is identified. Tricompartmental marginal spurring noted. Ligaments Suboptimally assessed by CT. Muscles and  Tendons Unremarkable Soft tissues Moderate-sized Baker's cyst. Distal SFA and popliteal  artery atheromatous vascular calcification. Subcutaneous edema anterior to the junction of the quadriceps tendon and lateral patellar retinaculum. IMPRESSION: 1. Large knee joint effusion. 2. Cortical thickening posterolaterally in the lateral tibial plateau and posterolaterally along the lateral femoral condyle, possibly degenerative although subcortical stress fractures are not excluded. There is some widening of the lateral compartment posteriorly raise the possibility of a prior compression of the lateral tibial plateau but no well-defined acute cortical fracture is identified. 3. 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 4. Moderate-sized Baker's cyst. 5. Subcutaneous edema anterior to the junction of the quadriceps tendon and lateral patellar retinaculum. 6. Distal SFA and popliteal artery atheromatous vascular calcification. Electronically Signed   By: Gaylyn Rong M.D.   On: 02/19/2023 13:33           LOS: 6 days   Time spent= 35 mins    Miguel Rota, MD [facility]  If 7PM-7AM, please contact night-coverage  02/21/2023, 8:37 AM

## 2023-02-21 NOTE — Hospital Course (Addendum)
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  Last known well around 10 PM the night prior to admission.  She wake up at 430 the day of admission and was noted to be confused.  She was noted to be hypertensive.  Systolic blood pressure 244.  She was noted to have subtle left facial droop, left upper extremity weakness.  CT head no acute finding.  She received IV labetalol and was placed on Cleviprex drip.  Neurology was consulted. Patient was transition to oral medication, care transferred to triad 10/20.  Since then blood pressure has remained quite labile.   Eventually with help of nephrology team patient's blood pressure medications were adjusted.  Currently on Norvasc 10 mg daily, Coreg and clonidine.  Overall she is doing better, renal function is stable and will need outpatient follow-up with nephrology.  PT recommending SNF.     Assessment & Plan:   Principal Problem:   Hypertensive urgency     Hypertensive emergency with hypertensive encephalopathy, improving Uncontrolled hypertension, labile BP still remains labile but on the higher side now. Goal SBP around 150.  MRI brain negative.  EEG is negative. Current blood pressure regimen-Norvasc 10 mg daily (increased), Coreg 6.25 mg twice daily, clonidine 0.2 mg weekly Compression stocking   Gaseous abdominal distention, resolved - Some evidence of constipation.  CT abdomen pelvis shows possible proctitis otherwise chronic changes.  Ambulate as much as possible.  No signs of infection or diarrhea.  Advised her to avoid any constipation   Left facial droop likely from herpes zoster shingles resolving Currently on scheduled Tylenol   Right knee pain, effusion.  X ray large effusion, suspicious for tibia  fracture.  CT is negative for any fracture, Ortho performed an arthrocentesis 10/22.  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  Received intra-articular steroid, started allopurinol   Hyponatremia;  Sodium is stable between 127-131   Hyperkalemia:  Closely monitor   Acute kidney injury on CKD stage IV: -Cr baseline 3.2--3.5.  Peaked at 5.1.  Labile BP likely is playing a role.  Hopefully we can stabilize blood pressure more.  Renal ultrasound is negative.  Nephrology is following.   UTI; klebsiella Symptomatic.  Completed 3 days of Rocephin on Oct 24th   CAD status post CABG Continue Plavix, Coreg, statin   Hyperlipidemia Lipitor   Constipation:  Bowel regimen     Rheumatoid arthritis History of DVT and PE but not on anticoagulation due to history of recurrent GI bleed       Estimated body mass index is 22.81 kg/m as calculated from the following:   Height as of this encounter: 5\' 3"  (1.6 m).   Weight as of this encounter: 58.4 kg.     DVT prophylaxis: Heparin Code Status: DNR Family Communication:  Disposition Plan:  Status is: Inpatient Remains inpatient appropriate because: Will await SNF placement  Patient needs SNF      Subjective: Doing well no complaints    Examination:   General exam: Appears calm and comfortable ; elderly frail Respiratory system: Clear to auscultation. Respiratory effort normal. Cardiovascular system: S1 & S2 heard, RRR. No JVD, murmurs, rubs, gallops or clicks. No pedal edema. Gastrointestinal system: Mild abdominal distention.  Positive bowel sounds. Central nervous system: Alert and oriented. No focal neurological deficits. Extremities:  Symmetric 5 x 5 power. Skin: No rashes, lesions or ulcers Psychiatry: Judgement and insight appear normal. Mood & affect appropriate.

## 2023-02-22 DIAGNOSIS — I160 Hypertensive urgency: Secondary | ICD-10-CM | POA: Diagnosis not present

## 2023-02-22 LAB — CBC
HCT: 34.3 % — ABNORMAL LOW (ref 36.0–46.0)
Hemoglobin: 11.5 g/dL — ABNORMAL LOW (ref 12.0–15.0)
MCH: 32.8 pg (ref 26.0–34.0)
MCHC: 33.5 g/dL (ref 30.0–36.0)
MCV: 97.7 fL (ref 80.0–100.0)
Platelets: 313 10*3/uL (ref 150–400)
RBC: 3.51 MIL/uL — ABNORMAL LOW (ref 3.87–5.11)
RDW: 17.3 % — ABNORMAL HIGH (ref 11.5–15.5)
WBC: 12.1 10*3/uL — ABNORMAL HIGH (ref 4.0–10.5)
nRBC: 1.7 % — ABNORMAL HIGH (ref 0.0–0.2)

## 2023-02-22 LAB — BASIC METABOLIC PANEL
Anion gap: 14 (ref 5–15)
BUN: 97 mg/dL — ABNORMAL HIGH (ref 8–23)
CO2: 18 mmol/L — ABNORMAL LOW (ref 22–32)
Calcium: 9.1 mg/dL (ref 8.9–10.3)
Chloride: 97 mmol/L — ABNORMAL LOW (ref 98–111)
Creatinine, Ser: 5.07 mg/dL — ABNORMAL HIGH (ref 0.44–1.00)
GFR, Estimated: 8 mL/min — ABNORMAL LOW (ref >60)
Glucose, Bld: 116 mg/dL — ABNORMAL HIGH (ref 70–99)
Potassium: 4.7 mmol/L (ref 3.5–5.1)
Sodium: 129 mmol/L — ABNORMAL LOW (ref 135–145)

## 2023-02-22 LAB — MAGNESIUM: Magnesium: 2.2 mg/dL (ref 1.7–2.4)

## 2023-02-22 MED ORDER — CLONIDINE HCL 0.2 MG/24HR TD PTWK
0.2000 mg | MEDICATED_PATCH | TRANSDERMAL | Status: DC
  Administered 2023-02-22: 0.2 mg via TRANSDERMAL
  Filled 2023-02-22: qty 1

## 2023-02-22 MED ORDER — HYDROXYZINE HCL 25 MG PO TABS
25.0000 mg | ORAL_TABLET | Freq: Three times a day (TID) | ORAL | Status: DC | PRN
  Administered 2023-02-22 – 2023-02-25 (×6): 25 mg via ORAL
  Filled 2023-02-22 (×6): qty 1

## 2023-02-22 NOTE — Progress Notes (Signed)
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 SBP  244 by EMS and left facial droop and LUE weakness. CT negative for bleed and the patient was given IV labetalol and started on a Cleviprex gtt in the ED. BUN/Cr was 90/4.8 in the ED with a negative UDS. She did have a fall a few days prior to admission with head trauma but again CT neg for bleed.  Cr  had decr from 5.1 -> nadir of 4.28 during this hospitalization before increasing to 5.09 but her baseline cr appears to be in the 3.2-3.7 range followed by Dr. Vallery Sa with [facility].   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 Vallery Sa with [facility].  BP dropped as low as 94/41 on 10/19, 90/32 on 10/20 but otherwise running in the 159-170's systolic range. Initially renal function was improving but then trended up which is likely related to the hypotensive episodes with systolics dropping at times to low 90's.  Urine protein >300. Weight has decr from 59kg to 54kg. UOP 2.7L /24hrs earlier in the hospitalzation but has dropped to oliguric range. Renal ultrasound shows small kidneys with low volumes, incr echogenicity but no e/o obstruction. 114/54 morning of consultation. - antihypertensives being titrated to hopefully prevent episodes of hypotension. Currently on Amlodipine 5mg  daily, carvedilol 6.25 BID, Catapres patch 0.2 on Thursdays, hydralazine PRN. Still having drops in BP but seem to be fewer episodes.   Had been labile with relative hypotensive episodes; will continue to trend as has been higher in past 24-48hrs.   Renal recovery affected by the labile BP; she continues to be much more alert. Catapres 0.2 patch to be replaced today and I confirmed she is on a Catapres 0.2 at home (usually changes on Sundays but sometimes the days get shifted).  I wonder if her BP is actually higher at home  even when on meds as her mental status improved with the higher blood pressures.  Na improving.  - agree with management and holding the ARB with AKI.  - will follow closely with you; even systolics in the 140-160's is fine and we can slowly titrate up on her antihypertensives as needed as an outpt.   - Avoid nephrotoxic medications including NSAIDs and iodinated intravenous contrast exposure unless the latter is absolutely indicated.   - Preferred narcotic agents for pain control are hydromorphone, fentanyl, and methadone. Morphine should not be used.  - Avoid Baclofen and avoid oral sodium phosphate and magnesium citrate based laxatives / bowel preps.  - Continue strict Input and Output monitoring. Will monitor the patient closely with you and intervene or adjust therapy as indicated by changes in clinical status/labs   AMS - had improved, more somnolent today but easily arousable. Hypertensive crisis initially treated with Labetolol and Cleviprex gtt initially now being managed with Catapres patch 0.2, amlodipine 5 and carvedilol 6.25 BID with prn hydralazine. Herpes zoster tx valcyte CASHD s/p CABG - no chest pain or signs of ischemia. RA  Subjective:   Awake today; conversing and oriented. Denies f/c/n/v/sob/ ha.  She is eating and tolerating PT; no PT yest tho. Very good UOP   Objective:   BP (!) 168/70 (BP Location: Right Arm)   Pulse 89   Temp 97.9 F (36.6 C) (Oral)   Resp 18   Ht 5\' 3"  (1.6 m)  Wt 57.3 kg   SpO2 93%   BMI 22.38 kg/m   Intake/Output Summary (Last 24 hours) at 02/22/2023 0802 Last data filed at 02/22/2023 0210 Gross per 24 hour  Intake 717.66 ml  Output 1050 ml  Net -332.34 ml   Weight change:   Physical Exam: General appearance: NAD, alert Head: NCAT Neck: no JVD, supple Back: symmetric Resp: CTA b/l Cardio: RRR GI: SNDNT +BS Extremities: no edema Pulses: 2+ and symmetric Skin: mild erythema left side of face  Imaging: No results  found.  Labs: BMET Recent Labs  Lab 02/16/23 0356 02/18/23 0413 02/19/23 0508 02/20/23 0358 02/21/23 0416 02/22/23 0434  NA 136 131* 129* 131* 127* 129*  K 3.8 3.6 3.7 3.7 4.3 4.7  CL 98 95* 95* 95* 96* 97*  CO2 19* 19* 19* 19* 18* 18*  GLUCOSE 110* 107* 108* 95 121* 116*  BUN 77* 79* 82* 84* 90* 97*  CREATININE 4.28* 5.01* 5.09* 4.97* 4.82* 5.07*  CALCIUM 9.7 9.2 8.5* 8.9 8.5* 9.1  PHOS 5.7*  --   --   --   --   --    CBC Recent Labs  Lab 02/16/23 0356 02/20/23 0358 02/21/23 0416 02/22/23 0434  WBC 10.6* 9.3 8.9 12.1*  HGB 12.6 10.5* 10.8* 11.5*  HCT 37.8 31.3* 32.6* 34.3*  MCV 99.0 96.3 97.3 97.7  PLT 285 250 261 313    Medications:     acetaminophen  500 mg Oral TID   allopurinol  100 mg Oral Daily   amLODipine  5 mg Oral Daily   ascorbic acid  500 mg Oral Daily   atorvastatin  80 mg Oral Daily   carvedilol  6.25 mg Oral BID WC   Chlorhexidine Gluconate Cloth  6 each Topical Daily   cholecalciferol  2,000 Units Oral Daily   cloNIDine  0.1 mg Transdermal Weekly   clopidogrel  75 mg Oral QODAY   cyanocobalamin  2,500 mcg Oral Daily   docusate sodium  100 mg Oral BID   feeding supplement  237 mL Oral BID BM   heparin  5,000 Units Subcutaneous Q8H   leflunomide  20 mg Oral Daily   levothyroxine  88 mcg Oral QAC breakfast   loratadine  10 mg Oral Daily   multivitamin with minerals  1 tablet Oral Daily   pantoprazole  40 mg Oral Daily   senna  1 tablet Oral Daily   sertraline  25 mg Oral Daily   triamcinolone  1 spray Nasal Daily      Paulene Floor, MD 02/22/2023, 8:02 AM

## 2023-02-22 NOTE — Plan of Care (Signed)

## 2023-02-22 NOTE — Progress Notes (Signed)
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  Last known well around 10 PM the night prior to admission.  She wake up at 430 the day of admission and was noted to be confused.  She was noted to be hypertensive.  Systolic blood pressure 244.  She was noted to have subtle left facial droop, left upper extremity weakness.  CT head no acute finding.  She received IV labetalol and was placed on Cleviprex drip.  Neurology was consulted. Patient was transition to oral medication, care transferred to triad 10/20.  Since then blood pressure has remained quite labile.       Assessment & Plan:   Principal Problem:   Hypertensive urgency     Hypertensive emergency with hypertensive encephalopathy Uncontrolled hypertension, labile BP still remains labile but on the higher side now. Goal SBP around 150.  MRI brain negative.  EEG is negative. Current blood pressure regimen-Norvasc 5 mg daily, Coreg 6.25 mg twice daily, clonidine 0.2 mg weekly Compression stocking    Left facial droop likely from herpes zoster shingles resolving Currently on scheduled Tylenol   Right knee pain, effusion.  X ray large effusion, suspicious for tibia fracture.  CT is negative for any fracture, Ortho performed an arthrocentesis 10/22.  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  Received intra-articular steroid, started allopurinol   Hyponatremia;  Sodium is again 127.  Will check urine electrolytes and osmolality   Hyperkalemia:  Closely monitor   Acute kidney injury on CKD stage IV: -Cr baseline 3.2--3.5.  Peaked at 5.1.  Labile BP likely is playing a role.  Hopefully  we can stabilize blood pressure more.  Renal ultrasound is negative.  Nephrology is following.   UTI; klebsiella Symptomatic.  Started Rocephin, will opt for total 3-day course   CAD status post CABG Continue Plavix, Coreg, statin   Hyperlipidemia Lipitor   Constipation:  Bowel regimen     Rheumatoid arthritis History of DVT and PE but not on anticoagulation due to history of recurrent GI bleed       Estimated body mass index is 22.81 kg/m as calculated from the following:   Height as of this encounter: 5\' 3"  (1.6 m).   Weight as of this encounter: 58.4 kg.     DVT prophylaxis: Heparin Code Status: DNR Family Communication: Family at bedside Disposition Plan:  Status is: Inpatient Remains inpatient appropriate because: Management of hypertension and AKI               Subjective:  Seen at bedside complaining of left sided facial itch otherwise no new complaints.  Examination:  General exam: Appears calm and comfortable  Respiratory system: Clear to auscultation. Respiratory effort normal. Cardiovascular system: S1 & S2 heard, RRR. No JVD, murmurs, rubs, gallops or clicks. No pedal edema. Gastrointestinal system: Abdomen is nondistended, soft and nontender. No organomegaly or masses felt. Normal bowel sounds heard. Central nervous system: Alert and oriented. No focal neurological deficits. Extremities: Symmetric 5 x 5 power. Skin: No rashes, lesions or ulcers Psychiatry: Judgement and insight appear normal. Mood & affect appropriate.       Diet Orders (From admission, onward)  Start     Ordered   02/20/23 1221  DIET DYS 3 Room service appropriate? Yes with Assist; Fluid consistency: Thin  Diet effective now       Question Answer Comment  Room service appropriate? Yes with Assist   Fluid consistency: Thin      02/20/23 1220            Objective: Vitals:   02/21/23 2129 02/22/23 0456 02/22/23 0924 02/22/23 1119  BP: (!) 185/72 (!)  168/70 (!) 190/69 (!) 148/67  Pulse: 92 89 85   Resp: 18 18 16    Temp: 97.8 F (36.6 C) 97.9 F (36.6 C) 98.4 F (36.9 C)   TempSrc: Oral Oral    SpO2: 94% 93% 94%   Weight:  57.3 kg    Height:        Intake/Output Summary (Last 24 hours) at 02/22/2023 1203 Last data filed at 02/22/2023 0900 Gross per 24 hour  Intake 617.66 ml  Output 1325 ml  Net -707.34 ml   Filed Weights   02/18/23 0500 02/19/23 0733 02/22/23 0456  Weight: 55.6 kg 58.4 kg 57.3 kg    Scheduled Meds:  acetaminophen  500 mg Oral TID   allopurinol  100 mg Oral Daily   amLODipine  5 mg Oral Daily   ascorbic acid  500 mg Oral Daily   atorvastatin  80 mg Oral Daily   carvedilol  6.25 mg Oral BID WC   Chlorhexidine Gluconate Cloth  6 each Topical Daily   cholecalciferol  2,000 Units Oral Daily   cloNIDine  0.2 mg Transdermal Q Thu   clopidogrel  75 mg Oral QODAY   cyanocobalamin  2,500 mcg Oral Daily   docusate sodium  100 mg Oral BID   feeding supplement  237 mL Oral BID BM   heparin  5,000 Units Subcutaneous Q8H   leflunomide  20 mg Oral Daily   levothyroxine  88 mcg Oral QAC breakfast   loratadine  10 mg Oral Daily   multivitamin with minerals  1 tablet Oral Daily   pantoprazole  40 mg Oral Daily   senna  1 tablet Oral Daily   sertraline  25 mg Oral Daily   triamcinolone  1 spray Nasal Daily   Continuous Infusions:  cefTRIAXone (ROCEPHIN)  IV 1 g (02/21/23 1646)    Nutritional status     Body mass index is 22.38 kg/m.  Data Reviewed:   CBC: Recent Labs  Lab 02/16/23 0356 02/20/23 0358 02/21/23 0416 02/22/23 0434  WBC 10.6* 9.3 8.9 12.1*  HGB 12.6 10.5* 10.8* 11.5*  HCT 37.8 31.3* 32.6* 34.3*  MCV 99.0 96.3 97.3 97.7  PLT 285 250 261 313   Basic Metabolic Panel: Recent Labs  Lab 02/16/23 0356 02/18/23 0413 02/19/23 0508 02/20/23 0358 02/21/23 0416 02/22/23 0434  NA 136 131* 129* 131* 127* 129*  K 3.8 3.6 3.7 3.7 4.3 4.7  CL 98 95* 95* 95* 96* 97*  CO2 19* 19* 19* 19*  18* 18*  GLUCOSE 110* 107* 108* 95 121* 116*  BUN 77* 79* 82* 84* 90* 97*  CREATININE 4.28* 5.01* 5.09* 4.97* 4.82* 5.07*  CALCIUM 9.7 9.2 8.5* 8.9 8.5* 9.1  MG 2.2  --   --   --   --  2.2  PHOS 5.7*  --   --   --   --   --    GFR: Estimated Creatinine Clearance: 6.8 mL/min (A) (by C-G formula based on SCr of 5.07 mg/dL (H)).  Liver Function Tests: No results for input(s): "AST", "ALT", "ALKPHOS", "BILITOT", "PROT", "ALBUMIN" in the last 168 hours. No results for input(s): "LIPASE", "AMYLASE" in the last 168 hours. No results for input(s): "AMMONIA" in the last 168 hours. Coagulation Profile: No results for input(s): "INR", "PROTIME" in the last 168 hours. Cardiac Enzymes: No results for input(s): "CKTOTAL", "CKMB", "CKMBINDEX", "TROPONINI" in the last 168 hours. BNP (last 3 results) No results for input(s): "PROBNP" in the last 8760 hours. HbA1C: No results for input(s): "HGBA1C" in the last 72 hours. CBG: Recent Labs  Lab 02/15/23 1517 02/15/23 1915 02/15/23 2316 02/16/23 0328 02/16/23 0720  GLUCAP 110* 98 100* 104* 117*   Lipid Profile: No results for input(s): "CHOL", "HDL", "LDLCALC", "TRIG", "CHOLHDL", "LDLDIRECT" in the last 72 hours. Thyroid Function Tests: No results for input(s): "TSH", "T4TOTAL", "FREET4", "T3FREE", "THYROIDAB" in the last 72 hours. Anemia Panel: No results for input(s): "VITAMINB12", "FOLATE", "FERRITIN", "TIBC", "IRON", "RETICCTPCT" in the last 72 hours. Sepsis Labs: No results for input(s): "PROCALCITON", "LATICACIDVEN" in the last 168 hours.  Recent Results (from the past 240 hour(s))  MRSA Next Gen by PCR, Nasal     Status: None   Collection Time: 02/15/23 11:33 AM   Specimen: Nasal Mucosa; Nasal Swab  Result Value Ref Range Status   MRSA by PCR Next Gen NOT DETECTED NOT DETECTED Final    Comment: (NOTE) The GeneXpert MRSA Assay (FDA approved for NASAL specimens only), is one component of a comprehensive MRSA colonization  surveillance program. It is not intended to diagnose MRSA infection nor to guide or monitor treatment for MRSA infections. Test performance is not FDA approved in patients less than 43 years old. Performed at [facility], [address], [place], [place] [address]   Body fluid culture w Gram Stain     Status: None (Preliminary result)   Collection Time: 02/19/23  1:45 PM   Specimen: Synovium; Body Fluid  Result Value Ref Range Status   Specimen Description SYNOVIAL  Final   Special Requests right knee  Final   Gram Stain   Final    FEW WBC PRESENT, PREDOMINANTLY PMN NO ORGANISMS SEEN    Culture   Final    NO GROWTH 2 DAYS Performed at [facility], [address], [place], [place] [address]    Report Status PENDING  Incomplete  Culture, OB Urine     Status: Abnormal   Collection Time: 02/20/23 11:10 AM   Specimen: Urine, Random  Result Value Ref Range Status   Specimen Description URINE, RANDOM  Final   Special Requests NONE  Final   Culture (A)  Final    >=100,000 COLONIES/mL KLEBSIELLA PNEUMONIAE NO GROUP B STREP (S.AGALACTIAE) ISOLATED Performed at [facility], [address], [place], [place] [address]    Report Status 02/22/2023 FINAL  Final   Organism ID, Bacteria KLEBSIELLA PNEUMONIAE (A)  Final      Susceptibility   Klebsiella pneumoniae - MIC*    CEFEPIME <=0.12 SENSITIVE Sensitive     CEFTRIAXONE <=0.25 SENSITIVE Sensitive     CIPROFLOXACIN <=0.25 SENSITIVE Sensitive     GENTAMICIN <=1 SENSITIVE Sensitive     IMIPENEM <=0.25 SENSITIVE Sensitive     NITROFURANTOIN 64 INTERMEDIATE Intermediate     TRIMETH/SULFA <=20 SENSITIVE Sensitive     PIP/TAZO <=4 SENSITIVE Sensitive ug/mL    * >=100,000 COLONIES/mL KLEBSIELLA PNEUMONIAE         Radiology Studies: No results found.  LOS: 7 days   Time spent= 35 mins    Miguel Rota, MD [facility]  If 7PM-7AM, please contact night-coverage  02/22/2023, 12:03 PM

## 2023-02-23 ENCOUNTER — Inpatient Hospital Stay (HOSPITAL_COMMUNITY): Payer: Medicare PPO

## 2023-02-23 DIAGNOSIS — I160 Hypertensive urgency: Secondary | ICD-10-CM | POA: Diagnosis not present

## 2023-02-23 LAB — BODY FLUID CULTURE W GRAM STAIN: Culture: NO GROWTH

## 2023-02-23 LAB — BASIC METABOLIC PANEL
Anion gap: 14 (ref 5–15)
BUN: 99 mg/dL — ABNORMAL HIGH (ref 8–23)
CO2: 18 mmol/L — ABNORMAL LOW (ref 22–32)
Calcium: 9.3 mg/dL (ref 8.9–10.3)
Chloride: 96 mmol/L — ABNORMAL LOW (ref 98–111)
Creatinine, Ser: 5.16 mg/dL — ABNORMAL HIGH (ref 0.44–1.00)
GFR, Estimated: 8 mL/min — ABNORMAL LOW (ref >60)
Glucose, Bld: 98 mg/dL (ref 70–99)
Potassium: 4.6 mmol/L (ref 3.5–5.1)
Sodium: 128 mmol/L — ABNORMAL LOW (ref 135–145)

## 2023-02-23 LAB — CULTURE, OB URINE: Culture: >=100000 — AB

## 2023-02-23 LAB — CBC
HCT: 33 % — ABNORMAL LOW (ref 36.0–46.0)
Hemoglobin: 10.9 g/dL — ABNORMAL LOW (ref 12.0–15.0)
MCH: 32.2 pg (ref 26.0–34.0)
MCHC: 33 g/dL (ref 30.0–36.0)
MCV: 97.3 fL (ref 80.0–100.0)
Platelets: 351 10*3/uL (ref 150–400)
RBC: 3.39 MIL/uL — ABNORMAL LOW (ref 3.87–5.11)
RDW: 17.2 % — ABNORMAL HIGH (ref 11.5–15.5)
WBC: 10.1 10*3/uL (ref 4.0–10.5)
nRBC: 1.8 % — ABNORMAL HIGH (ref 0.0–0.2)

## 2023-02-23 LAB — MAGNESIUM: Magnesium: 2 mg/dL (ref 1.7–2.4)

## 2023-02-23 MED ORDER — MELATONIN 3 MG PO TABS
3.0000 mg | ORAL_TABLET | Freq: Every evening | ORAL | Status: DC | PRN
  Administered 2023-02-25: 3 mg via ORAL
  Filled 2023-02-23: qty 1

## 2023-02-23 MED ORDER — BISACODYL 5 MG PO TBEC
10.0000 mg | DELAYED_RELEASE_TABLET | Freq: Every day | ORAL | Status: DC
  Administered 2023-02-23 – 2023-02-25 (×3): 10 mg via ORAL
  Filled 2023-02-23 (×3): qty 2

## 2023-02-23 MED ORDER — POLYETHYLENE GLYCOL 3350 17 G PO PACK
17.0000 g | PACK | Freq: Two times a day (BID) | ORAL | Status: DC | PRN
  Administered 2023-02-23: 17 g via ORAL
  Filled 2023-02-23: qty 1

## 2023-02-23 MED ORDER — B COMPLEX-C PO TABS
1.0000 | ORAL_TABLET | Freq: Every day | ORAL | Status: DC
  Administered 2023-02-23 – 2023-02-26 (×4): 1 via ORAL
  Filled 2023-02-23 (×4): qty 1

## 2023-02-23 MED ORDER — POLYETHYLENE GLYCOL 3350 17 G PO PACK: 17.0000 g | PACK | Freq: Two times a day (BID) | ORAL | Status: DC

## 2023-02-23 MED ORDER — AMLODIPINE BESYLATE 10 MG PO TABS
10.0000 mg | ORAL_TABLET | Freq: Every day | ORAL | Status: DC
  Administered 2023-02-23 – 2023-02-26 (×4): 10 mg via ORAL
  Filled 2023-02-23 (×4): qty 1

## 2023-02-23 NOTE — Progress Notes (Signed)
Patient's abdomen distended upon assessment this morning. Patient's abdomen looking more distended this afternoon upon exam. Patient denies flatus or bowel movement. RN notified Dr. Nelson Chimes. KUB ordered by MD.

## 2023-02-23 NOTE — Plan of Care (Signed)

## 2023-02-23 NOTE — Progress Notes (Signed)
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  Last known well around 10 PM the night prior to admission.  She wake up at 430 the day of admission and was noted to be confused.  She was noted to be hypertensive.  Systolic blood pressure 244.  She was noted to have subtle left facial droop, left upper extremity weakness.  CT head no acute finding.  She received IV labetalol and was placed on Cleviprex drip.  Neurology was consulted. Patient was transition to oral medication, care transferred to triad 10/20.  Since then blood pressure has remained quite labile.       Assessment & Plan:   Principal Problem:   Hypertensive urgency     Hypertensive emergency with hypertensive encephalopathy Uncontrolled hypertension, labile BP still remains labile but on the higher side now. Goal SBP around 150.  MRI brain negative.  EEG is negative. Current blood pressure regimen-Norvasc 10 mg daily (increased), Coreg 6.25 mg twice daily, clonidine 0.2 mg weekly Compression stocking    Left facial droop likely from herpes zoster shingles resolving Currently on scheduled Tylenol   Right knee pain, effusion.  X ray large effusion, suspicious for tibia fracture.  CT is negative for any fracture, Ortho performed an arthrocentesis 10/22.  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  Received intra-articular steroid, started allopurinol   Hyponatremia;  Sodium is again 127.  Will check urine electrolytes and osmolality   Hyperkalemia:  Closely monitor   Acute kidney injury on CKD stage IV: -Cr baseline 3.2--3.5.  Peaked at 5.1.  Labile BP likely is playing a role.   Hopefully we can stabilize blood pressure more.  Renal ultrasound is negative.  Nephrology is following.   UTI; klebsiella Symptomatic.  Completed 3 days of Rocephin on Oct 24th   CAD status post CABG Continue Plavix, Coreg, statin   Hyperlipidemia Lipitor   Constipation:  Bowel regimen     Rheumatoid arthritis History of DVT and PE but not on anticoagulation due to history of recurrent GI bleed       Estimated body mass index is 22.81 kg/m as calculated from the following:   Height as of this encounter: 5\' 3"  (1.6 m).   Weight as of this encounter: 58.4 kg.     DVT prophylaxis: Heparin Code Status: DNR Family Communication:  Disposition Plan:  Status is: Inpatient Remains inpatient appropriate because: Management of hypertension and AKI      Patient needs SNF         Subjective: Seen and examined at bedside.  Son is also present at bedside. Patient sleepy and usually stays up at night. No other complaints.   Examination:  General exam: Appears calm and comfortable  Respiratory system: Clear to auscultation. Respiratory effort normal. Cardiovascular system: S1 & S2 heard, RRR. No JVD, murmurs, rubs, gallops or clicks. No pedal edema. Gastrointestinal system: Abdomen is nondistended, soft and nontender. No organomegaly or masses felt. Normal bowel sounds heard. Central nervous system: Alert and oriented. No focal neurological deficits. Extremities: Symmetric 5 x 5 power. Skin: No rashes, lesions or ulcers Psychiatry: Judgement and insight appear normal. Mood & affect appropriate.  Diet Orders (From admission, onward)     Start     Ordered   02/20/23 1221  DIET DYS 3 Room service appropriate? Yes with Assist; Fluid consistency: Thin  Diet effective now       Question Answer Comment  Room service appropriate? Yes with Assist   Fluid consistency: Thin      02/20/23 1220            Objective: Vitals:   02/22/23 2026 02/22/23 2234  02/23/23 0452 02/23/23 0812  BP: (!) 180/69 (!) 144/64 (!) 187/64 (!) 163/68  Pulse: 83 87 89 (!) 102  Resp: 18  18 18   Temp: (!) 97.3 F (36.3 C)  97.6 F (36.4 C) 97.6 F (36.4 C)  TempSrc:      SpO2: 93%  92% 93%  Weight:      Height:        Intake/Output Summary (Last 24 hours) at 02/23/2023 1210 Last data filed at 02/23/2023 0900 Gross per 24 hour  Intake 340 ml  Output 752 ml  Net -412 ml   Filed Weights   02/18/23 0500 02/19/23 0733 02/22/23 0456  Weight: 55.6 kg 58.4 kg 57.3 kg    Scheduled Meds:  acetaminophen  500 mg Oral TID   allopurinol  100 mg Oral Daily   amLODipine  10 mg Oral Daily   ascorbic acid  500 mg Oral Daily   atorvastatin  80 mg Oral Daily   B-complex with vitamin C  1 tablet Oral Daily   carvedilol  6.25 mg Oral BID WC   Chlorhexidine Gluconate Cloth  6 each Topical Daily   cholecalciferol  2,000 Units Oral Daily   cloNIDine  0.2 mg Transdermal Q Thu   clopidogrel  75 mg Oral QODAY   cyanocobalamin  2,500 mcg Oral Daily   docusate sodium  100 mg Oral BID   feeding supplement  237 mL Oral BID BM   heparin  5,000 Units Subcutaneous Q8H   leflunomide  20 mg Oral Daily   levothyroxine  88 mcg Oral QAC breakfast   loratadine  10 mg Oral Daily   multivitamin with minerals  1 tablet Oral Daily   pantoprazole  40 mg Oral Daily   senna  1 tablet Oral Daily   sertraline  25 mg Oral Daily   triamcinolone  1 spray Nasal Daily   Continuous Infusions:  cefTRIAXone (ROCEPHIN)  IV 1 g (02/22/23 1514)    Nutritional status     Body mass index is 22.38 kg/m.  Data Reviewed:   CBC: Recent Labs  Lab 02/20/23 0358 02/21/23 0416 02/22/23 0434 02/23/23 0433  WBC 9.3 8.9 12.1* 10.1  HGB 10.5* 10.8* 11.5* 10.9*  HCT 31.3* 32.6* 34.3* 33.0*  MCV 96.3 97.3 97.7 97.3  PLT 250 261 313 351   Basic Metabolic Panel: Recent Labs  Lab 02/19/23 0508 02/20/23 0358 02/21/23 0416 02/22/23 0434 02/23/23 0433  NA 129* 131* 127* 129* 128*  K 3.7  3.7 4.3 4.7 4.6  CL 95* 95* 96* 97* 96*  CO2 19* 19* 18* 18* 18*  GLUCOSE 108* 95 121* 116* 98  BUN 82* 84* 90* 97* 99*  CREATININE 5.09* 4.97* 4.82* 5.07* 5.16*  CALCIUM 8.5* 8.9 8.5* 9.1 9.3  MG  --   --   --  2.2 2.0   GFR: Estimated Creatinine Clearance: 6.7 mL/min (A) (by C-G formula based on SCr of 5.16 mg/dL (H)). Liver Function Tests: No results for input(s): "AST", "ALT", "ALKPHOS", "  BILITOT", "PROT", "ALBUMIN" in the last 168 hours. No results for input(s): "LIPASE", "AMYLASE" in the last 168 hours. No results for input(s): "AMMONIA" in the last 168 hours. Coagulation Profile: No results for input(s): "INR", "PROTIME" in the last 168 hours. Cardiac Enzymes: No results for input(s): "CKTOTAL", "CKMB", "CKMBINDEX", "TROPONINI" in the last 168 hours. BNP (last 3 results) No results for input(s): "PROBNP" in the last 8760 hours. HbA1C: No results for input(s): "HGBA1C" in the last 72 hours. CBG: No results for input(s): "GLUCAP" in the last 168 hours. Lipid Profile: No results for input(s): "CHOL", "HDL", "LDLCALC", "TRIG", "CHOLHDL", "LDLDIRECT" in the last 72 hours. Thyroid Function Tests: No results for input(s): "TSH", "T4TOTAL", "FREET4", "T3FREE", "THYROIDAB" in the last 72 hours. Anemia Panel: No results for input(s): "VITAMINB12", "FOLATE", "FERRITIN", "TIBC", "IRON", "RETICCTPCT" in the last 72 hours. Sepsis Labs: No results for input(s): "PROCALCITON", "LATICACIDVEN" in the last 168 hours.  Recent Results (from the past 240 hour(s))  MRSA Next Gen by PCR, Nasal     Status: None   Collection Time: 02/15/23 11:33 AM   Specimen: Nasal Mucosa; Nasal Swab  Result Value Ref Range Status   MRSA by PCR Next Gen NOT DETECTED NOT DETECTED Final    Comment: (NOTE) The GeneXpert MRSA Assay (FDA approved for NASAL specimens only), is one component of a comprehensive MRSA colonization surveillance program. It is not intended to diagnose MRSA infection nor to guide or  monitor treatment for MRSA infections. Test performance is not FDA approved in patients less than 17 years old. Performed at [facility], [address], [place], [place] [address]   Body fluid culture w Gram Stain     Status: None (Preliminary result)   Collection Time: 02/19/23  1:45 PM   Specimen: Synovium; Body Fluid  Result Value Ref Range Status   Specimen Description SYNOVIAL  Final   Special Requests right knee  Final   Gram Stain   Final    FEW WBC PRESENT, PREDOMINANTLY PMN NO ORGANISMS SEEN    Culture   Final    NO GROWTH 3 DAYS Performed at [facility], [address] [place], [place] [address]    Report Status PENDING  Incomplete  Culture, OB Urine     Status: Abnormal   Collection Time: 02/20/23 11:10 AM   Specimen: Urine, Random  Result Value Ref Range Status   Specimen Description URINE, RANDOM  Final   Special Requests NONE  Final   Culture (A)  Final    >=100,000 COLONIES/mL KLEBSIELLA PNEUMONIAE NO GROUP B STREP (S.AGALACTIAE) ISOLATED Performed at [facility], [address], [place], [place] [address]    Report Status 02/23/2023 FINAL  Final   Organism ID, Bacteria KLEBSIELLA PNEUMONIAE (A)  Final      Susceptibility   Klebsiella pneumoniae - MIC*    AMPICILLIN RESISTANT Resistant     CEFEPIME <=0.12 SENSITIVE Sensitive     CEFTRIAXONE <=0.25 SENSITIVE Sensitive     CIPROFLOXACIN <=0.25 SENSITIVE Sensitive     GENTAMICIN <=1 SENSITIVE Sensitive     IMIPENEM <=0.25 SENSITIVE Sensitive     NITROFURANTOIN 64 INTERMEDIATE Intermediate     TRIMETH/SULFA <=20 SENSITIVE Sensitive     AMPICILLIN/SULBACTAM 4 SENSITIVE Sensitive     PIP/TAZO <=4 SENSITIVE Sensitive ug/mL    * >=100,000 COLONIES/mL KLEBSIELLA PNEUMONIAE         Radiology Studies: No results found.         LOS: 8 days  Time spent= 35 mins    Miguel Rota, MD [facility]  If 7PM-7AM, please contact night-coverage  02/23/2023, 12:10 PM

## 2023-02-23 NOTE — Progress Notes (Signed)
Physical Therapy Treatment Patient Details Name: [name] MRN: [id number] DOB: 07-Jul-1940 Today's Date: 02/23/2023   History of Present Illness 82 y.o. female presents to [facility] hospital on 10/17 with altered mental status with associated hypertensive crisis. AKI on CKD stage IV. MI negative for acute findings, found to have herpes zoster shingles incidentally. 10/22 s/p R knee aspiration and steroid injection by orthopedic PA. PMH includes CKD IV, HTN, RA, hypothyroidism, CABG, COPD, HLD, PE.    PT Comments  Pt received in supine, lethargic initially but agreeable to therapy session, pt more alert once seated EOB. Pt with good participation and improved tolerance for transfer and gait training this session. Pt assisted to don BLE compression socks prior to OOB but still with drop in SBP from supine to standing >20 points, RN notified. Pt with only minimal c/o dizziness with transfers and reports 5/10 modified RPE (fatigue) at end of session. Pt continues to benefit from PT services to progress toward functional mobility goals.    If plan is discharge home, recommend the following: A lot of help with walking and/or transfers;A lot of help with bathing/dressing/bathroom;Assistance with cooking/housework;Direct supervision/assist for medications management;Direct supervision/assist for financial management;Assist for transportation;Help with stairs or ramp for entrance   Can travel by private vehicle     Yes  Equipment Recommendations  None recommended by PT (TBD)    Recommendations for Other Services       Precautions / Restrictions Precautions Precautions: Fall Precaution Comments: fall and hit head prior to admission; SBP goal <150 per MD (permissive HTN due to recent orthostatic hypotension) Required Braces or Orthoses:  (Compression socks donned prior to OOB 10/25) Restrictions Weight Bearing Restrictions: No     Mobility  Bed Mobility Overal bed mobility: Needs Assistance Bed  Mobility: Rolling, Sidelying to Sit Rolling: Supervision, Used rails Sidelying to sit: Contact guard assist, HOB elevated, Used rails       General bed mobility comments: to R EOB, good effort, heavy rail use and HOB very elevated. Anticipate pt would be closer to minA lift assist from flat bed without rails.    Transfers Overall transfer level: Needs assistance Equipment used: Rolling walker (2 wheels) Transfers: Sit to/from Stand, Bed to chair/wheelchair/BSC Sit to Stand: Contact guard assist, Min assist           General transfer comment: Pt CGA today from EOB>RW. MinA for stand>sit due to pt fatigue and needs cues for UE placement and lowering assist for safety.    Ambulation/Gait Ambulation/Gait assistance: Contact guard assist Gait Distance (Feet): 85 Feet Assistive device: Rolling walker (2 wheels) Gait Pattern/deviations: Step-through pattern, Drifts right/left       General Gait Details: Cues for posture/forward gaze, mostly CGA with navigational cues and instruction on activity pacing.      Balance Overall balance assessment: Needs assistance Sitting-balance support: No upper extremity supported, Feet supported Sitting balance-Leahy Scale: Good Sitting balance - Comments: sitting EOB performing ADLs for 20+ mins   Standing balance support: Reliant on assistive device for balance, Single extremity supported Standing balance-Leahy Scale: Fair Standing balance comment: Fair static stand during BP assessment single UE support; reliant on RW for gait                            Cognition Arousal: Alert Behavior During Therapy: WFL for tasks assessed/performed Overall Cognitive Status: Within Functional Limits for tasks assessed  General Comments: Pt lethargic when in bed, tired, groggy, but once sitting up became fully alert and conversational. Some slow processing but good participation as able.        Exercises General  Exercises - Lower Extremity Ankle Circles/Pumps: AROM, Both, 10 reps, Supine Long Arc Quad: AROM, Both, 5 reps, Seated    General Comments General comments (skin integrity, edema, etc.): 02/23/23 1600  Vital Signs  Patient Position (if appropriate) Orthostatic Vitals  Orthostatic Lying   BP- Lying 159/66  Pulse- Lying 79  Orthostatic Sitting  BP- Sitting 145/65  Pulse- Sitting 80  Orthostatic Standing at 0 minutes  BP- Standing at 0 minutes (!) 132/118  Pulse- Standing at 0 minutes 95      Pertinent Vitals/Pain Pain Assessment Pain Assessment: Faces Faces Pain Scale: Hurts a little bit Pain Location: generalized, abdominal bloating Pain Descriptors / Indicators: Grimacing, Discomfort Pain Intervention(s): Monitored during session, Limited activity within patient's tolerance, Repositioned     PT Goals (current goals can now be found in the care plan section) Acute Rehab PT Goals PT Goal Formulation: With patient/family Time For Goal Achievement: 03/04/23 Progress towards PT goals: Progressing toward goals    Frequency    Min 1X/week      PT Plan         AM-PAC PT "6 Clicks" Mobility   Outcome Measure  Help needed turning from your back to your side while in a flat bed without using bedrails?: A Little Help needed moving from lying on your back to sitting on the side of a flat bed without using bedrails?: A Lot (anticipated from flat bed, no rails) Help needed moving to and from a bed to a chair (including a wheelchair)?: A Little Help needed standing up from a chair using your arms (e.g., wheelchair or bedside chair)?: A Little Help needed to walk in hospital room?: A Little Help needed climbing 3-5 steps with a railing? : Total 6 Click Score: 15    End of Session Equipment Utilized During Treatment: Gait belt Activity Tolerance: Patient tolerated treatment well Patient left: in chair;with call bell/phone within reach;with chair alarm set;with family/visitor present  (spouse in room with her) Nurse Communication: Mobility status;Other (comment) (abdominal distension, compression socks donned) PT Visit Diagnosis: History of falling (Z91.81);Muscle weakness (generalized) (M62.81);Difficulty in walking, not elsewhere classified (R26.2);Other symptoms and signs involving the nervous system (R29.898);Pain     Time: 1610-9604 PT Time Calculation (min) (ACUTE ONLY): 25 min  Charges:    $Gait Training: 8-22 mins $Therapeutic Activity: 8-22 mins PT General Charges $$ ACUTE PT VISIT: 1 Visit                     Sherry Jernigan P., PTA Acute Rehabilitation Services Secure Chat Preferred 9a-5:30pm Office: [phone]    [name] [facility] 02/23/2023, 6:28 PM

## 2023-02-23 NOTE — Progress Notes (Signed)
[facility] Progress Note   82 y.o. 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 SBP  244 by EMS and left facial droop and LUE weakness. CT negative for bleed and the patient was given IV labetalol and started on a Cleviprex gtt in the ED. BUN/Cr was 90/4.8 in the ED with a negative UDS. She did have a fall a few days prior to admission with head trauma but again CT neg for bleed.  Cr  had decr from 5.1 -> nadir of 4.28 during this hospitalization before increasing to 5.09 but her baseline cr appears to be in the 3.2-3.7 range followed by Dr. Vallery Sa with [facility].   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 Vallery Sa with [facility].  BP dropped as low as 94/41 on 10/19, 90/32 on 10/20 but otherwise running in the 159-170's systolic range. Initially renal function was improving but then trended up which is likely related to the hypotensive episodes with systolics dropping at times to low 90's.  Urine protein >300. Weight has decr from 59kg to 54kg. UOP 2.7L /24hrs earlier in the hospitalzation but has dropped to oliguric range. Renal ultrasound shows small kidneys with low volumes, incr echogenicity but no e/o obstruction. 114/54 morning of consultation. - antihypertensives being titrated to hopefully prevent episodes of hypotension. Currently on Amlodipine 5mg  daily, carvedilol 6.25 BID, Catapres patch 0.2 on Thursdays, hydralazine PRN. Still having drops in BP but seem to be fewer episodes.   Had been labile with relative hypotensive episodes; will continue to trend as has been higher in past 24-48hrs.   Renal recovery affected by the labile BP; she continues to be much more alert. Catapres 0.2 patch replaced on 10/24; I confirmed she is on a Catapres 0.2 at home (usually changes on Sundays but sometimes the days get shifted). Increase Amlodipine to 10mg  daily.  I wonder if her BP  is actually higher at home even when on meds as her mental status improved with the higher blood pressures.  - agree with management and holding the ARB with AKI.  - will follow closely with you; even systolics in the 140-160's is fine and we can slowly titrate up on her antihypertensives as needed as an outpt.   - Avoid nephrotoxic medications including NSAIDs and iodinated intravenous contrast exposure unless the latter is absolutely indicated.   - Preferred narcotic agents for pain control are hydromorphone, fentanyl, and methadone. Morphine should not be used.  - Avoid Baclofen and avoid oral sodium phosphate and magnesium citrate based laxatives / bowel preps.  - Continue strict Input and Output monitoring. Will monitor the patient closely with you and intervene or adjust therapy as indicated by changes in clinical status/labs   AMS - had improved, more somnolent today but easily arousable. Hypertensive crisis initially treated with Labetolol and Cleviprex gtt initially now being managed with Catapres patch 0.2, amlodipine 5 and carvedilol 6.25 BID with prn hydralazine. Herpes zoster tx valcyte CASHD s/p CABG - no chest pain or signs of ischemia. RA  Subjective:   Awake today; conversing and oriented. Denies f/c/n/v/sob/ ha.  She was actually eating in a bedside chair, tolerating PT Continues to have good UOP   Objective:   BP (!) 163/68 (BP Location: Right Arm)   Pulse (!) 102   Temp 97.6 F (36.4 C)   Resp 18   Ht 5\' 3"  (  1.6 m)   Wt 57.3 kg   SpO2 93%   BMI 22.38 kg/m   Intake/Output Summary (Last 24 hours) at 02/23/2023 0818 Last data filed at 02/23/2023 0981 Gross per 24 hour  Intake 240 ml  Output 1027 ml  Net -787 ml   Weight change:   Physical Exam: General appearance: NAD, alert Head: NCAT Neck: no JVD, supple Back: symmetric Resp: CTA b/l Cardio: RRR GI: SNDNT +BS Extremities: no edema Pulses: 2+ and symmetric Skin: mild erythema left side of  face  Imaging: No results found.  Labs: BMET Recent Labs  Lab 02/18/23 0413 02/19/23 0508 02/20/23 0358 02/21/23 0416 02/22/23 0434 02/23/23 0433  NA 131* 129* 131* 127* 129* 128*  K 3.6 3.7 3.7 4.3 4.7 4.6  CL 95* 95* 95* 96* 97* 96*  CO2 19* 19* 19* 18* 18* 18*  GLUCOSE 107* 108* 95 121* 116* 98  BUN 79* 82* 84* 90* 97* 99*  CREATININE 5.01* 5.09* 4.97* 4.82* 5.07* 5.16*  CALCIUM 9.2 8.5* 8.9 8.5* 9.1 9.3   CBC Recent Labs  Lab 02/20/23 0358 02/21/23 0416 02/22/23 0434 02/23/23 0433  WBC 9.3 8.9 12.1* 10.1  HGB 10.5* 10.8* 11.5* 10.9*  HCT 31.3* 32.6* 34.3* 33.0*  MCV 96.3 97.3 97.7 97.3  PLT 250 261 313 351    Medications:     acetaminophen  500 mg Oral TID   allopurinol  100 mg Oral Daily   amLODipine  5 mg Oral Daily   ascorbic acid  500 mg Oral Daily   atorvastatin  80 mg Oral Daily   carvedilol  6.25 mg Oral BID WC   Chlorhexidine Gluconate Cloth  6 each Topical Daily   cholecalciferol  2,000 Units Oral Daily   cloNIDine  0.2 mg Transdermal Q Thu   clopidogrel  75 mg Oral QODAY   cyanocobalamin  2,500 mcg Oral Daily   docusate sodium  100 mg Oral BID   feeding supplement  237 mL Oral BID BM   heparin  5,000 Units Subcutaneous Q8H   leflunomide  20 mg Oral Daily   levothyroxine  88 mcg Oral QAC breakfast   loratadine  10 mg Oral Daily   multivitamin with minerals  1 tablet Oral Daily   pantoprazole  40 mg Oral Daily   senna  1 tablet Oral Daily   sertraline  25 mg Oral Daily   triamcinolone  1 spray Nasal Daily      Paulene Floor, MD 02/23/2023, 8:18 AM

## 2023-02-24 ENCOUNTER — Inpatient Hospital Stay (HOSPITAL_COMMUNITY): Payer: Medicare PPO

## 2023-02-24 DIAGNOSIS — I160 Hypertensive urgency: Secondary | ICD-10-CM | POA: Diagnosis not present

## 2023-02-24 LAB — BASIC METABOLIC PANEL
Anion gap: 14 (ref 5–15)
BUN: 106 mg/dL — ABNORMAL HIGH (ref 8–23)
CO2: 19 mmol/L — ABNORMAL LOW (ref 22–32)
Calcium: 9.9 mg/dL (ref 8.9–10.3)
Chloride: 95 mmol/L — ABNORMAL LOW (ref 98–111)
Creatinine, Ser: 4.99 mg/dL — ABNORMAL HIGH (ref 0.44–1.00)
GFR, Estimated: 8 mL/min — ABNORMAL LOW (ref >60)
Glucose, Bld: 103 mg/dL — ABNORMAL HIGH (ref 70–99)
Potassium: 4.9 mmol/L (ref 3.5–5.1)
Sodium: 128 mmol/L — ABNORMAL LOW (ref 135–145)

## 2023-02-24 LAB — CBC
HCT: 33.5 % — ABNORMAL LOW (ref 36.0–46.0)
Hemoglobin: 11.3 g/dL — ABNORMAL LOW (ref 12.0–15.0)
MCH: 32.8 pg (ref 26.0–34.0)
MCHC: 33.7 g/dL (ref 30.0–36.0)
MCV: 97.4 fL (ref 80.0–100.0)
Platelets: 363 10*3/uL (ref 150–400)
RBC: 3.44 MIL/uL — ABNORMAL LOW (ref 3.87–5.11)
RDW: 17.3 % — ABNORMAL HIGH (ref 11.5–15.5)
WBC: 9.2 10*3/uL (ref 4.0–10.5)
nRBC: 1.4 % — ABNORMAL HIGH (ref 0.0–0.2)

## 2023-02-24 LAB — MAGNESIUM: Magnesium: 2.2 mg/dL (ref 1.7–2.4)

## 2023-02-24 LAB — NA AND K (SODIUM & POTASSIUM), RAND UR
Potassium Urine: 30 mmol/L
Sodium, Ur: 34 mmol/L

## 2023-02-24 LAB — OSMOLALITY, URINE: Osmolality, Ur: 386 mosm/kg (ref 300–900)

## 2023-02-24 LAB — CREATININE, URINE, RANDOM: Creatinine, Urine: 67 mg/dL

## 2023-02-24 MED ORDER — DEXTROSE-SODIUM CHLORIDE 5-0.9 % IV SOLN: INTRAVENOUS | Status: AC

## 2023-02-24 MED ORDER — IOHEXOL 9 MG/ML PO SOLN
500.0000 mL | ORAL | Status: AC
  Administered 2023-02-24 (×2): 500 mL via ORAL

## 2023-02-24 MED ORDER — BISACODYL 10 MG RE SUPP
10.0000 mg | Freq: Every day | RECTAL | Status: DC | PRN
  Administered 2023-02-24: 10 mg via RECTAL
  Filled 2023-02-24: qty 1

## 2023-02-24 NOTE — Progress Notes (Signed)
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  Last known well around 10 PM the night prior to admission.  She wake up at 430 the day of admission and was noted to be confused.  She was noted to be hypertensive.  Systolic blood pressure 244.  She was noted to have subtle left facial droop, left upper extremity weakness.  CT head no acute finding.  She received IV labetalol and was placed on Cleviprex drip.  Neurology was consulted. Patient was transition to oral medication, care transferred to [facility] 10/20.  Since then blood pressure has remained quite labile.       Assessment & Plan:   Principal Problem:   Hypertensive urgency     Hypertensive emergency with hypertensive encephalopathy Uncontrolled hypertension, labile BP still remains labile but on the higher side now. Goal SBP around 150.  MRI brain negative.  EEG is negative. Current blood pressure regimen-Norvasc 10 mg daily (increased), Coreg 6.25 mg twice daily, clonidine 0.2 mg weekly Compression stocking   Gaseous abdominal distention - Possible constipation.  Bowel regimen and enema ordered.  If she becomes nauseous, may require NG tube placement.   Left facial droop likely from herpes zoster shingles resolving Currently on scheduled Tylenol   Right knee pain, effusion.  X ray large effusion, suspicious for tibia fracture.  CT is negative for any fracture, Ortho performed an arthrocentesis 10/22.  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  Received intra-articular steroid, started allopurinol   Hyponatremia;  Sodium is again 127.  Will check urine electrolytes and osmolality    Hyperkalemia:  Closely monitor   Acute kidney injury on CKD stage IV: -Cr baseline 3.2--3.5.  Peaked at 5.1.  Labile BP likely is playing a role.  Hopefully we can stabilize blood pressure more.  Renal ultrasound is negative.  Nephrology is following.   UTI; klebsiella Symptomatic.  Completed 3 days of Rocephin on Oct 24th   CAD status post CABG Continue Plavix, Coreg, statin   Hyperlipidemia Lipitor   Constipation:  Bowel regimen     Rheumatoid arthritis History of DVT and PE but not on anticoagulation due to history of recurrent GI bleed       Estimated body mass index is 22.81 kg/m as calculated from the following:   Height as of this encounter: 5\' 3"  (1.6 m).   Weight as of this encounter: 58.4 kg.     DVT prophylaxis: Heparin Code Status: DNR Family Communication:  Disposition Plan:  Status is: Inpatient Remains inpatient appropriate because: Management of hypertension and AKI      Patient needs SNF      Subjective: Seen up in the chair, overall tells me she feels better.   Examination:   General exam: Appears calm and comfortable ; elderly frail Respiratory system: Clear to auscultation. Respiratory effort normal. Cardiovascular system: S1 & S2 heard, RRR. No JVD, murmurs, rubs, gallops or clicks. No pedal edema. Gastrointestinal system: Mild abdominal distention.  Positive bowel sounds. Central nervous system: Alert and oriented. No focal neurological deficits. Extremities: Symmetric 5 x 5 power. Skin: No rashes, lesions or ulcers Psychiatry: Judgement and insight appear normal. Mood & affect appropriate.  Diet Orders (From admission, onward)     Start     Ordered   02/20/23 1221  DIET DYS 3 Room service appropriate? Yes with Assist; Fluid consistency: Thin  Diet effective now       Question Answer Comment  Room service appropriate? Yes with Assist   Fluid consistency: Thin      02/20/23 1220             Objective: Vitals:   02/24/23 0500 02/24/23 0525 02/24/23 0853 02/24/23 1010  BP:  (!) 188/73 (!) 180/76 139/85  Pulse:  90 90   Resp:  15 18   Temp:  97.7 F (36.5 C) (!) 97.5 F (36.4 C)   TempSrc:   Oral   SpO2:  92% 98%   Weight: 57.9 kg     Height:        Intake/Output Summary (Last 24 hours) at 02/24/2023 1116 Last data filed at 02/24/2023 0900 Gross per 24 hour  Intake 435 ml  Output 1175 ml  Net -740 ml   Filed Weights   02/19/23 0733 02/22/23 0456 02/24/23 0500  Weight: 58.4 kg 57.3 kg 57.9 kg    Scheduled Meds:  acetaminophen  500 mg Oral TID   allopurinol  100 mg Oral Daily   amLODipine  10 mg Oral Daily   ascorbic acid  500 mg Oral Daily   atorvastatin  80 mg Oral Daily   B-complex with vitamin C  1 tablet Oral Daily   bisacodyl  10 mg Oral Q1500   carvedilol  6.25 mg Oral BID WC   Chlorhexidine Gluconate Cloth  6 each Topical Daily   cholecalciferol  2,000 Units Oral Daily   cloNIDine  0.2 mg Transdermal Q Thu   clopidogrel  75 mg Oral QODAY   cyanocobalamin  2,500 mcg Oral Daily   docusate sodium  100 mg Oral BID   feeding supplement  237 mL Oral BID BM   heparin  5,000 Units Subcutaneous Q8H   leflunomide  20 mg Oral Daily   levothyroxine  88 mcg Oral QAC breakfast   loratadine  10 mg Oral Daily   multivitamin with minerals  1 tablet Oral Daily   pantoprazole  40 mg Oral Daily   senna  1 tablet Oral Daily   sertraline  25 mg Oral Daily   triamcinolone  1 spray Nasal Daily   Continuous Infusions:  dextrose 5 % and 0.9 % NaCl 40 mL/hr at 02/24/23 0907    Nutritional status     Body mass index is 22.61 kg/m.  Data Reviewed:   CBC: Recent Labs  Lab 02/20/23 0358 02/21/23 0416 02/22/23 0434 02/23/23 0433 02/24/23 0435  WBC 9.3 8.9 12.1* 10.1 9.2  HGB 10.5* 10.8* 11.5* 10.9* 11.3*  HCT 31.3* 32.6* 34.3* 33.0* 33.5*  MCV 96.3 97.3 97.7 97.3 97.4  PLT 250 261 313 351 363   Basic Metabolic Panel: Recent Labs  Lab  02/20/23 0358 02/21/23 0416 02/22/23 0434 02/23/23 0433 02/24/23 0435  NA 131* 127* 129* 128* 128*  K 3.7 4.3 4.7 4.6 4.9  CL 95* 96* 97* 96* 95*  CO2 19* 18* 18* 18* 19*  GLUCOSE 95 121* 116* 98 103*  BUN 84* 90* 97* 99* 106*  CREATININE 4.97* 4.82* 5.07* 5.16* 4.99*  CALCIUM 8.9 8.5* 9.1 9.3 9.9  MG  --   --  2.2 2.0 2.2   GFR: Estimated Creatinine Clearance: 6.9 mL/min (A) (by C-G formula based on SCr of 4.99  mg/dL (H)). Liver Function Tests: No results for input(s): "AST", "ALT", "ALKPHOS", "BILITOT", "PROT", "ALBUMIN" in the last 168 hours. No results for input(s): "LIPASE", "AMYLASE" in the last 168 hours. No results for input(s): "AMMONIA" in the last 168 hours. Coagulation Profile: No results for input(s): "INR", "PROTIME" in the last 168 hours. Cardiac Enzymes: No results for input(s): "CKTOTAL", "CKMB", "CKMBINDEX", "TROPONINI" in the last 168 hours. BNP (last 3 results) No results for input(s): "PROBNP" in the last 8760 hours. HbA1C: No results for input(s): "HGBA1C" in the last 72 hours. CBG: No results for input(s): "GLUCAP" in the last 168 hours. Lipid Profile: No results for input(s): "CHOL", "HDL", "LDLCALC", "TRIG", "CHOLHDL", "LDLDIRECT" in the last 72 hours. Thyroid Function Tests: No results for input(s): "TSH", "T4TOTAL", "FREET4", "T3FREE", "THYROIDAB" in the last 72 hours. Anemia Panel: No results for input(s): "VITAMINB12", "FOLATE", "FERRITIN", "TIBC", "IRON", "RETICCTPCT" in the last 72 hours. Sepsis Labs: No results for input(s): "PROCALCITON", "LATICACIDVEN" in the last 168 hours.  Recent Results (from the past 240 hour(s))  MRSA Next Gen by PCR, Nasal     Status: None   Collection Time: 02/15/23 11:33 AM   Specimen: Nasal Mucosa; Nasal Swab  Result Value Ref Range Status   MRSA by PCR Next Gen NOT DETECTED NOT DETECTED Final    Comment: (NOTE) The GeneXpert MRSA Assay (FDA approved for NASAL specimens only), is one component of a  comprehensive MRSA colonization surveillance program. It is not intended to diagnose MRSA infection nor to guide or monitor treatment for MRSA infections. Test performance is not FDA approved in patients less than 75 years old. Performed at [facility], [address], [place], [place] [address]   Body fluid culture w Gram Stain     Status: None   Collection Time: 02/19/23  1:45 PM   Specimen: Synovium; Body Fluid  Result Value Ref Range Status   Specimen Description SYNOVIAL  Final   Special Requests right knee  Final   Gram Stain   Final    FEW WBC PRESENT, PREDOMINANTLY PMN NO ORGANISMS SEEN    Culture   Final    NO GROWTH 3 DAYS Performed at [facility], [address], [place], [place] [address]    Report Status 02/23/2023 FINAL  Final  Culture, OB Urine     Status: Abnormal   Collection Time: 02/20/23 11:10 AM   Specimen: Urine, Random  Result Value Ref Range Status   Specimen Description URINE, RANDOM  Final   Special Requests NONE  Final   Culture (A)  Final    >=100,000 COLONIES/mL KLEBSIELLA PNEUMONIAE NO GROUP B STREP (S.AGALACTIAE) ISOLATED Performed at [facility], [address] [place], [place] [address]    Report Status 02/23/2023 FINAL  Final   Organism ID, Bacteria KLEBSIELLA PNEUMONIAE (A)  Final      Susceptibility   Klebsiella pneumoniae - MIC*    AMPICILLIN RESISTANT Resistant     CEFEPIME <=0.12 SENSITIVE Sensitive     CEFTRIAXONE <=0.25 SENSITIVE Sensitive     CIPROFLOXACIN <=0.25 SENSITIVE Sensitive     GENTAMICIN <=1 SENSITIVE Sensitive     IMIPENEM <=0.25 SENSITIVE Sensitive     NITROFURANTOIN 64 INTERMEDIATE Intermediate     TRIMETH/SULFA <=20 SENSITIVE Sensitive     AMPICILLIN/SULBACTAM 4 SENSITIVE Sensitive     PIP/TAZO <=4 SENSITIVE Sensitive ug/mL    * >=100,000 COLONIES/mL KLEBSIELLA PNEUMONIAE         Radiology Studies: DG Abd 1  View  Result Date: 02/23/2023 CLINICAL DATA:  Acute abdominal pain EXAM:  ABDOMEN - 1 VIEW COMPARISON:  Abdominal x-ray 09/21/2022 FINDINGS: There is gaseous distention of the stomach. Air seen throughout nondilated colon and small bowel to the level the rectum. There are no suspicious calcifications. There is dextroconvex curvature of the lumbar spine with degenerative change. Left hip arthroplasty is present. IMPRESSION: Gaseous distention of the stomach. Nonobstructive bowel gas pattern. Electronically Signed   By: Darliss Cheney M.D.   On: 02/23/2023 18:12           LOS: 9 days   Time spent= 35 mins    Miguel Rota, MD [facility]  If 7PM-7AM, please contact night-coverage  02/24/2023, 11:16 AM

## 2023-02-24 NOTE — Progress Notes (Signed)
[facility] Progress Note   82 y.o. 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 SBP  244 by EMS and left facial droop and LUE weakness. CT negative for bleed and the patient was given IV labetalol and started on a Cleviprex gtt in the ED. BUN/Cr was 90/4.8 in the ED with a negative UDS. She did have a fall a few days prior to admission with head trauma but again CT neg for bleed.  Cr  had decr from 5.1 -> nadir of 4.28 during this hospitalization before increasing to 5.09 but her baseline cr appears to be in the 3.2-3.7 range followed by Dr. Vallery Sa with [facility].   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 Vallery Sa with [facility].  BP dropped as low as 94/41 on 10/19, 90/32 on 10/20 but otherwise running in the 159-170's systolic range. Initially renal function was improving but then trended up which is likely related to the hypotensive episodes with systolics dropping at times to low 90's.  Urine protein >300. Weight has decr from 59kg to 54kg. UOP 2.7L /24hrs earlier in the hospitalzation but has dropped to oliguric range. Renal ultrasound shows small kidneys with low volumes, incr echogenicity but no e/o obstruction. 114/54 morning of consultation. - antihypertensives being titrated to hopefully prevent episodes of hypotension. Currently on Amlodipine 5mg  daily, carvedilol 6.25 BID, Catapres patch 0.2 on Thursdays, hydralazine PRN. Still having drops in BP but seem to be fewer episodes.   Had been labile with relative hypotensive episodes; will continue to trend as has been higher in past 24-48hrs.   Renal recovery affected by the labile BP; she continues to be much more alert. Catapres 0.2 patch replaced on 10/24; I confirmed she is on a Catapres 0.2 at home (usually changes on Sundays but sometimes the days get shifted). Increased Amlodipine to 10mg  daily on 10/25.  I wonder  if her BP is actually higher at home even when on meds as her mental status improved with the higher blood pressures.  - agree with management and holding the ARB with AKI.  - will follow closely with you; even systolics in the 140-160's is fine and we can slowly titrate up on her antihypertensives as needed as an outpt.  Cr stabilizing but BUN cont to incr (no e/o bleeding) but she is net neg 4.8L during this hospitalization. She has a D5NS bedside that is full not being given. Will give the D5NS.   - Avoid nephrotoxic medications including NSAIDs and iodinated intravenous contrast exposure unless the latter is absolutely indicated.   - Preferred narcotic agents for pain control are hydromorphone, fentanyl, and methadone. Morphine should not be used.  - Avoid Baclofen and avoid oral sodium phosphate and magnesium citrate based laxatives / bowel preps.  - Continue strict Input and Output monitoring. Will monitor the patient closely with you and intervene or adjust therapy as indicated by changes in clinical status/labs   AMS - had improved, more somnolent today but easily arousable. Hypertensive crisis initially treated with Labetolol and Cleviprex gtt initially now being managed with Catapres patch 0.2, amlodipine 5 and carvedilol 6.25 BID with prn hydralazine. Herpes zoster tx valcyte CASHD s/p CABG - no chest pain or signs of ischemia. RA  Subjective:   Awake today; conversing and oriented. Denies f/c/n/v/sob/ ha.  Feeling worse today, very tired; states she slept ok. Decent UOP  Objective:   BP (!) 188/73 (BP Location: Right Arm)   Pulse 90   Temp 97.7 F (36.5 C)   Resp 15   Ht 5\' 3"  (1.6 m)   Wt 57.9 kg   SpO2 92%   BMI 22.61 kg/m   Intake/Output Summary (Last 24 hours) at 02/24/2023 0747 Last data filed at 02/24/2023 0543 Gross per 24 hour  Intake 535 ml  Output 925 ml  Net -390 ml   Weight change:   Physical Exam: General appearance: NAD, alert Head: NCAT Neck:  no JVD, supple Back: symmetric Resp: CTA b/l Cardio: RRR GI: SNDNT +BS Extremities: no edema Pulses: 2+ and symmetric Skin: mild erythema left side of face  Imaging: DG Abd 1 View  Result Date: 02/23/2023 CLINICAL DATA:  Acute abdominal pain EXAM: ABDOMEN - 1 VIEW COMPARISON:  Abdominal x-ray 09/21/2022 FINDINGS: There is gaseous distention of the stomach. Air seen throughout nondilated colon and small bowel to the level the rectum. There are no suspicious calcifications. There is dextroconvex curvature of the lumbar spine with degenerative change. Left hip arthroplasty is present. IMPRESSION: Gaseous distention of the stomach. Nonobstructive bowel gas pattern. Electronically Signed   By: Darliss Cheney M.D.   On: 02/23/2023 18:12    Labs: BMET Recent Labs  Lab 02/18/23 0413 02/19/23 0508 02/20/23 0358 02/21/23 0416 02/22/23 0434 02/23/23 0433 02/24/23 0435  NA 131* 129* 131* 127* 129* 128* 128*  K 3.6 3.7 3.7 4.3 4.7 4.6 4.9  CL 95* 95* 95* 96* 97* 96* 95*  CO2 19* 19* 19* 18* 18* 18* 19*  GLUCOSE 107* 108* 95 121* 116* 98 103*  BUN 79* 82* 84* 90* 97* 99* 106*  CREATININE 5.01* 5.09* 4.97* 4.82* 5.07* 5.16* 4.99*  CALCIUM 9.2 8.5* 8.9 8.5* 9.1 9.3 9.9   CBC Recent Labs  Lab 02/21/23 0416 02/22/23 0434 02/23/23 0433 02/24/23 0435  WBC 8.9 12.1* 10.1 9.2  HGB 10.8* 11.5* 10.9* 11.3*  HCT 32.6* 34.3* 33.0* 33.5*  MCV 97.3 97.7 97.3 97.4  PLT 261 313 351 363    Medications:     acetaminophen  500 mg Oral TID   allopurinol  100 mg Oral Daily   amLODipine  10 mg Oral Daily   ascorbic acid  500 mg Oral Daily   atorvastatin  80 mg Oral Daily   B-complex with vitamin C  1 tablet Oral Daily   bisacodyl  10 mg Oral Q1500   carvedilol  6.25 mg Oral BID WC   Chlorhexidine Gluconate Cloth  6 each Topical Daily   cholecalciferol  2,000 Units Oral Daily   cloNIDine  0.2 mg Transdermal Q Thu   clopidogrel  75 mg Oral QODAY   cyanocobalamin  2,500 mcg Oral Daily    docusate sodium  100 mg Oral BID   feeding supplement  237 mL Oral BID BM   heparin  5,000 Units Subcutaneous Q8H   leflunomide  20 mg Oral Daily   levothyroxine  88 mcg Oral QAC breakfast   loratadine  10 mg Oral Daily   multivitamin with minerals  1 tablet Oral Daily   pantoprazole  40 mg Oral Daily   senna  1 tablet Oral Daily   sertraline  25 mg Oral Daily   triamcinolone  1 spray Nasal Daily      Paulene Floor, MD 02/24/2023, 7:47 AM

## 2023-02-24 NOTE — Plan of Care (Signed)

## 2023-02-25 DIAGNOSIS — I160 Hypertensive urgency: Secondary | ICD-10-CM | POA: Diagnosis not present

## 2023-02-25 LAB — BASIC METABOLIC PANEL
Anion gap: 12 (ref 5–15)
BUN: 99 mg/dL — ABNORMAL HIGH (ref 8–23)
CO2: 18 mmol/L — ABNORMAL LOW (ref 22–32)
Calcium: 9.7 mg/dL (ref 8.9–10.3)
Chloride: 97 mmol/L — ABNORMAL LOW (ref 98–111)
Creatinine, Ser: 4.57 mg/dL — ABNORMAL HIGH (ref 0.44–1.00)
GFR, Estimated: 9 mL/min — ABNORMAL LOW (ref >60)
Glucose, Bld: 94 mg/dL (ref 70–99)
Potassium: 4.6 mmol/L (ref 3.5–5.1)
Sodium: 127 mmol/L — ABNORMAL LOW (ref 135–145)

## 2023-02-25 LAB — MAGNESIUM: Magnesium: 2.2 mg/dL (ref 1.7–2.4)

## 2023-02-25 LAB — CBC
HCT: 31.5 % — ABNORMAL LOW (ref 36.0–46.0)
Hemoglobin: 10.6 g/dL — ABNORMAL LOW (ref 12.0–15.0)
MCH: 32.2 pg (ref 26.0–34.0)
MCHC: 33.7 g/dL (ref 30.0–36.0)
MCV: 95.7 fL (ref 80.0–100.0)
Platelets: 361 10*3/uL (ref 150–400)
RBC: 3.29 MIL/uL — ABNORMAL LOW (ref 3.87–5.11)
RDW: 17.2 % — ABNORMAL HIGH (ref 11.5–15.5)
WBC: 10.9 10*3/uL — ABNORMAL HIGH (ref 4.0–10.5)
nRBC: 0.8 % — ABNORMAL HIGH (ref 0.0–0.2)

## 2023-02-25 NOTE — Plan of Care (Signed)

## 2023-02-25 NOTE — Progress Notes (Signed)
[facility] Progress Note   82 y.o. 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 SBP  244 by EMS and left facial droop and LUE weakness. CT negative for bleed and the patient was given IV labetalol and started on a Cleviprex gtt in the ED. BUN/Cr was 90/4.8 in the ED with a negative UDS. She did have a fall a few days prior to admission with head trauma but again CT neg for bleed.  Cr  had decr from 5.1 -> nadir of 4.28 during this hospitalization before increasing to 5.09 but her baseline cr appears to be in the 3.2-3.7 range followed by Dr. Vallery Sa with [facility].   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 Vallery Sa with [facility].  BP dropped as low as 94/41 on 10/19, 90/32 on 10/20 but otherwise running in the 159-170's systolic range. Initially renal function was improving but then trended up which is likely related to the hypotensive episodes with systolics dropping at times to low 90's.  Urine protein >300. Weight has decr from 59kg to 54kg. UOP 2.7L /24hrs earlier in the hospitalzation but has dropped to oliguric range. Renal ultrasound shows small kidneys with low volumes, incr echogenicity but no e/o obstruction. 114/54 morning of consultation.  - antihypertensives being titrated to hopefully prevent episodes of hypotension. Currently on Amlodipine 5mg  daily, carvedilol 6.25 BID, Catapres patch 0.2 on Thursdays, hydralazine PRN.   Had been labile with relative hypotensive episodes; much fewer drops in BP and starting to have more consistent readings in BP usually 140-150's/54-85.  Renal recovery affected by the labile BP; she continues to be much more alert. Catapres 0.2 patch replaced on 10/24; I confirmed she is on a Catapres 0.2 at home (usually changes on Sundays but sometimes the days get shifted). Increased Amlodipine to 10mg  daily on 10/25.   I wonder if her BP is  actually higher at home even when on meds as her mental status improved with the higher blood pressures.  She's much more alert today with a good appetite; renal function stabilizing also with no uremic symptoms.   - agree with management and holding the ARB with AKI.  - will follow closely with you; even systolics in the 140-160's is fine and we can slowly titrate up on her antihypertensives as needed as an outpt.   - Avoid nephrotoxic medications including NSAIDs and iodinated intravenous contrast exposure unless the latter is absolutely indicated.   - Preferred narcotic agents for pain control are hydromorphone, fentanyl, and methadone. Morphine should not be used.  - Avoid Baclofen and avoid oral sodium phosphate and magnesium citrate based laxatives / bowel preps.  - Continue strict Input and Output monitoring. Will monitor the patient closely with you and intervene or adjust therapy as indicated by changes in clinical status/labs   AMS - improved today. Hypertensive crisis initially treated with Labetolol and Cleviprex gtt initially now being managed with Catapres patch 0.2, amlodipine 5 -> 10 and carvedilol 6.25 BID with prn hydralazine. UTI -> Klebsiella tx Rocephin x3d Herpes zoster tx valcyte CASHD s/p CABG - no chest pain or signs of ischemia. RA  Subjective:   Awake today; conversing and oriented. Denies f/c/n/v/sob/ ha.  Feeling worse today, very tired; states she slept ok. Decent UOP   Objective:   BP (!) 157/63 (BP Location: Right Arm)   Pulse 73   Temp  98.2 F (36.8 C)   Resp 16   Ht 5\' 3"  (1.6 m)   Wt 57.4 kg   SpO2 93%   BMI 22.42 kg/m   Intake/Output Summary (Last 24 hours) at 02/25/2023 0741 Last data filed at 02/25/2023 0515 Gross per 24 hour  Intake 1285 ml  Output 550 ml  Net 735 ml   Weight change: -0.5 kg  Physical Exam: General appearance: NAD, alert Head: NCAT Neck: no JVD, supple Back: symmetric Resp: CTA b/l Cardio: RRR GI: SNDNT  +BS Extremities: no edema Pulses: 2+ and symmetric Skin: mild erythema left side of face  Imaging: CT ABDOMEN PELVIS WO CONTRAST  Result Date: 02/24/2023 CLINICAL DATA:  Abdominal pain, acute, nonlocalized. Stomach distention. EXAM: CT ABDOMEN AND PELVIS WITHOUT CONTRAST TECHNIQUE: Multidetector CT imaging of the abdomen and pelvis was performed following the standard protocol without IV contrast. RADIATION DOSE REDUCTION: This exam was performed according to the departmental dose-optimization program which includes automated exposure control, adjustment of the mA and/or kV according to patient size and/or use of iterative reconstruction technique. COMPARISON:  01/22/2012. FINDINGS: Lower chest: The heart is enlarged. There is a trace left pleural effusion. Atelectasis or scarring is noted at the lung bases bilaterally. Hepatobiliary: The left lobe of the liver has a nodular contour, suggesting underlying cirrhosis. No focal abnormality is seen in the liver. No biliary ductal dilatation. Stones are present within the gallbladder. Pancreas: Unremarkable. No pancreatic ductal dilatation or surrounding inflammatory changes. Spleen: Surgically absent. Adrenals/Urinary Tract: Adrenal glands are within normal limits. Cysts are present in the kidneys bilaterally. No renal calculus or hydronephrosis bilaterally. The visualized portion of the urinary bladder is within normal limits. Stomach/Bowel: There is a small hiatal hernia. Stomach is within normal limits. Appendix is not seen. There is diffuse rectal wall thickening with surrounding fat stranding. Scattered diverticula are present along the colon without evidence of diverticulitis. No free air or pneumatosis. Vascular/Lymphatic: Aortic atherosclerosis. No enlarged abdominal or pelvic lymph nodes. Reproductive: Uterus and bilateral adnexa are unremarkable. Other: No abdominopelvic ascites. Musculoskeletal: Degenerative changes are present in the thoracolumbar  spine. Total hip arthroplasty changes are present on the left. No acute osseous abnormality. IMPRESSION: 1. Marked rectal wall thickening with surrounding fat stranding suggesting proctitis. 2. Trace left pleural effusion with atelectasis at the lung bases. 3. Small hiatal hernia. 4. Diverticulosis without diverticulitis. 5. Nodular contour of the left lobe of the liver suggesting cirrhosis. 6. Cholelithiasis. 7. Aortic atherosclerosis Electronically Signed   By: Thornell Sartorius M.D.   On: 02/24/2023 21:53   DG Abd 1 View  Result Date: 02/23/2023 CLINICAL DATA:  Acute abdominal pain EXAM: ABDOMEN - 1 VIEW COMPARISON:  Abdominal x-ray 09/21/2022 FINDINGS: There is gaseous distention of the stomach. Air seen throughout nondilated colon and small bowel to the level the rectum. There are no suspicious calcifications. There is dextroconvex curvature of the lumbar spine with degenerative change. Left hip arthroplasty is present. IMPRESSION: Gaseous distention of the stomach. Nonobstructive bowel gas pattern. Electronically Signed   By: Darliss Cheney M.D.   On: 02/23/2023 18:12    Labs: BMET Recent Labs  Lab 02/19/23 0508 02/20/23 0358 02/21/23 0416 02/22/23 0434 02/23/23 0433 02/24/23 0435 02/25/23 0448  NA 129* 131* 127* 129* 128* 128* 127*  K 3.7 3.7 4.3 4.7 4.6 4.9 4.6  CL 95* 95* 96* 97* 96* 95* 97*  CO2 19* 19* 18* 18* 18* 19* 18*  GLUCOSE 108* 95 121* 116* 98 103* 94  BUN 82* 84*  90* 97* 99* 106* 99*  CREATININE 5.09* 4.97* 4.82* 5.07* 5.16* 4.99* 4.57*  CALCIUM 8.5* 8.9 8.5* 9.1 9.3 9.9 9.7   CBC Recent Labs  Lab 02/22/23 0434 02/23/23 0433 02/24/23 0435 02/25/23 0448  WBC 12.1* 10.1 9.2 10.9*  HGB 11.5* 10.9* 11.3* 10.6*  HCT 34.3* 33.0* 33.5* 31.5*  MCV 97.7 97.3 97.4 95.7  PLT 313 351 363 361    Medications:     acetaminophen  500 mg Oral TID   allopurinol  100 mg Oral Daily   amLODipine  10 mg Oral Daily   ascorbic acid  500 mg Oral Daily   atorvastatin  80 mg Oral  Daily   B-complex with vitamin C  1 tablet Oral Daily   bisacodyl  10 mg Oral Q1500   carvedilol  6.25 mg Oral BID WC   Chlorhexidine Gluconate Cloth  6 each Topical Daily   cholecalciferol  2,000 Units Oral Daily   cloNIDine  0.2 mg Transdermal Q Thu   clopidogrel  75 mg Oral QODAY   cyanocobalamin  2,500 mcg Oral Daily   docusate sodium  100 mg Oral BID   feeding supplement  237 mL Oral BID BM   heparin  5,000 Units Subcutaneous Q8H   leflunomide  20 mg Oral Daily   levothyroxine  88 mcg Oral QAC breakfast   loratadine  10 mg Oral Daily   multivitamin with minerals  1 tablet Oral Daily   pantoprazole  40 mg Oral Daily   senna  1 tablet Oral Daily   sertraline  25 mg Oral Daily   triamcinolone  1 spray Nasal Daily      Paulene Floor, MD 02/25/2023, 7:41 AM

## 2023-02-25 NOTE — TOC Progression Note (Signed)
Transition of Care [facility]) - Progression Note    Patient Details  Name: [name] MRN: [id number] Date of Birth: 01-20-1941  [facility] CM/SW Contact  Dellie Burns [place], [place] Phone Number: 02/25/2023, 11:36 AM  Clinical Narrative:   per MD, plan for dc to SNF tomorrow. Prior SW notes indicate pt has a bed at [facility]. Unable to confirm bed with admissions at [facility] today. [facility] auth request submitted, reference [id number]. Will need to f/u with [facility] tomorrow.   Dellie Burns, MSW, LCSW [phone] (coverage)      Expected Discharge Plan: Skilled Nursing Facility Barriers to Discharge: Continued Medical Work up  Expected Discharge Plan and Services       Living arrangements for the past 2 months: Independent Living Facility                                       Social Determinants of Health (SDOH) Interventions SDOH Screenings   Food Insecurity: No Food Insecurity (02/17/2023)  Housing: Patient Declined (02/17/2023)  Transportation Needs: No Transportation Needs (02/17/2023)  Utilities: Not At Risk (02/17/2023)  Alcohol Screen: Low Risk  (05/30/2021)  Depression (PHQ2-9): Low Risk  (02/08/2023)  Recent Concern: Depression (PHQ2-9) - Medium Risk (01/22/2023)  Financial Resource Strain: Low Risk  (09/05/2022)  Physical Activity: Insufficiently Active (01/22/2023)  Social Connections: Moderately Integrated (09/05/2022)  Stress: Stress Concern Present (01/22/2023)  Tobacco Use: Medium Risk (02/09/2023)    Readmission Risk Interventions    07/05/2022    9:42 AM  Readmission Risk Prevention Plan  Transportation Screening Complete  Medication Review (RN Care Manager) Complete  PCP or Specialist appointment within 3-5 days of discharge Complete  HRI or Home Care Consult Complete  SW Recovery Care/Counseling Consult Complete  Palliative Care Screening Not Applicable  Skilled Nursing Facility Complete

## 2023-02-25 NOTE — Progress Notes (Signed)
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  Last known well around 10 PM the night prior to admission.  She wake up at 430 the day of admission and was noted to be confused.  She was noted to be hypertensive.  Systolic blood pressure 244.  She was noted to have subtle left facial droop, left upper extremity weakness.  CT head no acute finding.  She received IV labetalol and was placed on Cleviprex drip.  Neurology was consulted. Patient was transition to oral medication, care transferred to [facility] 10/20.  Since then blood pressure has remained quite labile.       Assessment & Plan:   Principal Problem:   Hypertensive urgency     Hypertensive emergency with hypertensive encephalopathy, improving Uncontrolled hypertension, labile BP still remains labile but on the higher side now. Goal SBP around 150.  MRI brain negative.  EEG is negative. Current blood pressure regimen-Norvasc 10 mg daily (increased), Coreg 6.25 mg twice daily, clonidine 0.2 mg weekly Compression stocking   Gaseous abdominal distention - Some evidence of constipation.  CT abdomen pelvis shows possible proctitis otherwise chronic changes.  Ambulate as much as possible.  No signs of infection or diarrhea.  Advised her to avoid any constipation   Left facial droop likely from herpes zoster shingles resolving Currently on scheduled Tylenol   Right knee pain, effusion.  X ray large effusion, suspicious for tibia fracture.  CT is negative for any fracture, Ortho performed an arthrocentesis 10/22.  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  Received intra-articular steroid,  started allopurinol   Hyponatremia;  Sodium is stable between 127-131   Hyperkalemia:  Closely monitor   Acute kidney injury on CKD stage IV: -Cr baseline 3.2--3.5.  Peaked at 5.1.  Labile BP likely is playing a role.  Hopefully we can stabilize blood pressure more.  Renal ultrasound is negative.  Nephrology is following.   UTI; klebsiella Symptomatic.  Completed 3 days of Rocephin on Oct 24th   CAD status post CABG Continue Plavix, Coreg, statin   Hyperlipidemia Lipitor   Constipation:  Bowel regimen     Rheumatoid arthritis History of DVT and PE but not on anticoagulation due to history of recurrent GI bleed       Estimated body mass index is 22.81 kg/m as calculated from the following:   Height as of this encounter: 5\' 3"  (1.6 m).   Weight as of this encounter: 58.4 kg.     DVT prophylaxis: Heparin Code Status: DNR Family Communication:  Disposition Plan:  Status is: Inpatient Remains inpatient appropriate because: Will await SNF placement  Patient needs SNF      Subjective: Drowsy but no complaints.     Examination:   General exam: Appears calm and comfortable ; elderly frail Respiratory system: Clear to auscultation. Respiratory effort normal. Cardiovascular system: S1 & S2 heard, RRR. No JVD, murmurs, rubs, gallops or clicks. No pedal edema. Gastrointestinal system: Mild abdominal distention.  Positive bowel sounds. Central nervous system: Alert and oriented. No focal neurological deficits. Extremities: Symmetric 5 x 5 power. Skin: No rashes, lesions or ulcers Psychiatry: Judgement and insight appear normal. Mood & affect  appropriate.              Diet Orders (From admission, onward)     Start     Ordered   02/20/23 1221  DIET DYS 3 Room service appropriate? Yes with Assist; Fluid consistency: Thin  Diet effective now       Question Answer Comment  Room service appropriate? Yes with Assist   Fluid consistency: Thin      02/20/23 1220             Objective: Vitals:   02/24/23 1622 02/24/23 2003 02/25/23 0500 02/25/23 0800  BP: (!) 158/81 (!) 157/63  (!) 173/64  Pulse: 83 73  77  Resp: 18 16  18   Temp: 98.2 F (36.8 C)   (!) 97.5 F (36.4 C)  TempSrc:    Oral  SpO2: 94% 93%  98%  Weight:   57.4 kg   Height:        Intake/Output Summary (Last 24 hours) at 02/25/2023 1110 Last data filed at 02/25/2023 0900 Gross per 24 hour  Intake 1285 ml  Output 300 ml  Net 985 ml   Filed Weights   02/22/23 0456 02/24/23 0500 02/25/23 0500  Weight: 57.3 kg 57.9 kg 57.4 kg    Scheduled Meds:  acetaminophen  500 mg Oral TID   allopurinol  100 mg Oral Daily   amLODipine  10 mg Oral Daily   ascorbic acid  500 mg Oral Daily   atorvastatin  80 mg Oral Daily   B-complex with vitamin C  1 tablet Oral Daily   bisacodyl  10 mg Oral Q1500   carvedilol  6.25 mg Oral BID WC   Chlorhexidine Gluconate Cloth  6 each Topical Daily   cholecalciferol  2,000 Units Oral Daily   cloNIDine  0.2 mg Transdermal Q Thu   clopidogrel  75 mg Oral QODAY   cyanocobalamin  2,500 mcg Oral Daily   docusate sodium  100 mg Oral BID   feeding supplement  237 mL Oral BID BM   heparin  5,000 Units Subcutaneous Q8H   leflunomide  20 mg Oral Daily   levothyroxine  88 mcg Oral QAC breakfast   loratadine  10 mg Oral Daily   multivitamin with minerals  1 tablet Oral Daily   pantoprazole  40 mg Oral Daily   senna  1 tablet Oral Daily   sertraline  25 mg Oral Daily   triamcinolone  1 spray Nasal Daily   Continuous Infusions:  Nutritional status     Body mass index is 22.42 kg/m.  Data Reviewed:   CBC: Recent Labs  Lab 02/21/23 0416 02/22/23 0434 02/23/23 0433 02/24/23 0435 02/25/23 0448  WBC 8.9 12.1* 10.1 9.2 10.9*  HGB 10.8* 11.5* 10.9* 11.3* 10.6*  HCT 32.6* 34.3* 33.0* 33.5* 31.5*  MCV 97.3 97.7 97.3 97.4 95.7  PLT 261 313 351 363 361   Basic Metabolic Panel: Recent Labs  Lab 02/21/23 0416 02/22/23 0434 02/23/23 0433  02/24/23 0435 02/25/23 0448  NA 127* 129* 128* 128* 127*  K 4.3 4.7 4.6 4.9 4.6  CL 96* 97* 96* 95* 97*  CO2 18* 18* 18* 19* 18*  GLUCOSE 121* 116* 98 103* 94  BUN 90* 97* 99* 106* 99*  CREATININE 4.82* 5.07* 5.16* 4.99* 4.57*  CALCIUM 8.5* 9.1 9.3 9.9 9.7  MG  --  2.2 2.0 2.2 2.2   GFR: Estimated Creatinine Clearance: 7.6 mL/min (A) (by C-G formula based on SCr of 4.57 mg/dL (H)).  Liver Function Tests: No results for input(s): "AST", "ALT", "ALKPHOS", "BILITOT", "PROT", "ALBUMIN" in the last 168 hours. No results for input(s): "LIPASE", "AMYLASE" in the last 168 hours. No results for input(s): "AMMONIA" in the last 168 hours. Coagulation Profile: No results for input(s): "INR", "PROTIME" in the last 168 hours. Cardiac Enzymes: No results for input(s): "CKTOTAL", "CKMB", "CKMBINDEX", "TROPONINI" in the last 168 hours. BNP (last 3 results) No results for input(s): "PROBNP" in the last 8760 hours. HbA1C: No results for input(s): "HGBA1C" in the last 72 hours. CBG: No results for input(s): "GLUCAP" in the last 168 hours. Lipid Profile: No results for input(s): "CHOL", "HDL", "LDLCALC", "TRIG", "CHOLHDL", "LDLDIRECT" in the last 72 hours. Thyroid Function Tests: No results for input(s): "TSH", "T4TOTAL", "FREET4", "T3FREE", "THYROIDAB" in the last 72 hours. Anemia Panel: No results for input(s): "VITAMINB12", "FOLATE", "FERRITIN", "TIBC", "IRON", "RETICCTPCT" in the last 72 hours. Sepsis Labs: No results for input(s): "PROCALCITON", "LATICACIDVEN" in the last 168 hours.  Recent Results (from the past 240 hour(s))  MRSA Next Gen by PCR, Nasal     Status: None   Collection Time: 02/15/23 11:33 AM   Specimen: Nasal Mucosa; Nasal Swab  Result Value Ref Range Status   MRSA by PCR Next Gen NOT DETECTED NOT DETECTED Final    Comment: (NOTE) The GeneXpert MRSA Assay (FDA approved for NASAL specimens only), is one component of a comprehensive MRSA colonization surveillance program.  It is not intended to diagnose MRSA infection nor to guide or monitor treatment for MRSA infections. Test performance is not FDA approved in patients less than 51 years old. Performed at [facility], [address], [place], [place] [address]   Body fluid culture w Gram Stain     Status: None   Collection Time: 02/19/23  1:45 PM   Specimen: Synovium; Body Fluid  Result Value Ref Range Status   Specimen Description SYNOVIAL  Final   Special Requests right knee  Final   Gram Stain   Final    FEW WBC PRESENT, PREDOMINANTLY PMN NO ORGANISMS SEEN    Culture   Final    NO GROWTH 3 DAYS Performed at [facility], [address], [place], [place] [address]    Report Status 02/23/2023 FINAL  Final  Culture, OB Urine     Status: Abnormal   Collection Time: 02/20/23 11:10 AM   Specimen: Urine, Random  Result Value Ref Range Status   Specimen Description URINE, RANDOM  Final   Special Requests NONE  Final   Culture (A)  Final    >=100,000 COLONIES/mL KLEBSIELLA PNEUMONIAE NO GROUP B STREP (S.AGALACTIAE) ISOLATED Performed at [facility], [address], [place], [place] [address]    Report Status 02/23/2023 FINAL  Final   Organism ID, Bacteria KLEBSIELLA PNEUMONIAE (A)  Final      Susceptibility   Klebsiella pneumoniae - MIC*    AMPICILLIN RESISTANT Resistant     CEFEPIME <=0.12 SENSITIVE Sensitive     CEFTRIAXONE <=0.25 SENSITIVE Sensitive     CIPROFLOXACIN <=0.25 SENSITIVE Sensitive     GENTAMICIN <=1 SENSITIVE Sensitive     IMIPENEM <=0.25 SENSITIVE Sensitive     NITROFURANTOIN 64 INTERMEDIATE Intermediate     TRIMETH/SULFA <=20 SENSITIVE Sensitive     AMPICILLIN/SULBACTAM 4 SENSITIVE Sensitive     PIP/TAZO <=4 SENSITIVE Sensitive ug/mL    * >=100,000 COLONIES/mL KLEBSIELLA PNEUMONIAE         Radiology Studies: CT ABDOMEN PELVIS WO CONTRAST  Result Date: 02/24/2023 CLINICAL DATA:  Abdominal pain, acute, nonlocalized. Stomach distention. EXAM: CT  ABDOMEN AND PELVIS WITHOUT CONTRAST TECHNIQUE: Multidetector CT imaging of the abdomen and pelvis was performed following the standard protocol without IV contrast. RADIATION DOSE REDUCTION: This exam was performed according to the departmental dose-optimization program which includes automated exposure control, adjustment of the mA and/or kV according to patient size and/or use of iterative reconstruction technique. COMPARISON:  01/22/2012. FINDINGS: Lower chest: The heart is enlarged. There is a trace left pleural effusion. Atelectasis or scarring is noted at the lung bases bilaterally. Hepatobiliary: The left lobe of the liver has a nodular contour, suggesting underlying cirrhosis. No focal abnormality is seen in the liver. No biliary ductal dilatation. Stones are present within the gallbladder. Pancreas: Unremarkable. No pancreatic ductal dilatation or surrounding inflammatory changes. Spleen: Surgically absent. Adrenals/Urinary Tract: Adrenal glands are within normal limits. Cysts are present in the kidneys bilaterally. No renal calculus or hydronephrosis bilaterally. The visualized portion of the urinary bladder is within normal limits. Stomach/Bowel: There is a small hiatal hernia. Stomach is within normal limits. Appendix is not seen. There is diffuse rectal wall thickening with surrounding fat stranding. Scattered diverticula are present along the colon without evidence of diverticulitis. No free air or pneumatosis. Vascular/Lymphatic: Aortic atherosclerosis. No enlarged abdominal or pelvic lymph nodes. Reproductive: Uterus and bilateral adnexa are unremarkable. Other: No abdominopelvic ascites. Musculoskeletal: Degenerative changes are present in the thoracolumbar spine. Total hip arthroplasty changes are present on the left. No acute osseous abnormality. IMPRESSION: 1. Marked rectal wall thickening with surrounding fat stranding suggesting proctitis. 2. Trace left pleural effusion with atelectasis at the  lung bases. 3. Small hiatal hernia. 4. Diverticulosis without diverticulitis. 5. Nodular contour of the left lobe of the liver suggesting cirrhosis. 6. Cholelithiasis. 7. Aortic atherosclerosis Electronically Signed   By: Thornell Sartorius M.D.   On: 02/24/2023 21:53   DG Abd 1 View  Result Date: 02/23/2023 CLINICAL DATA:  Acute abdominal pain EXAM: ABDOMEN - 1 VIEW COMPARISON:  Abdominal x-ray 09/21/2022 FINDINGS: There is gaseous distention of the stomach. Air seen throughout nondilated colon and small bowel to the level the rectum. There are no suspicious calcifications. There is dextroconvex curvature of the lumbar spine with degenerative change. Left hip arthroplasty is present. IMPRESSION: Gaseous distention of the stomach. Nonobstructive bowel gas pattern. Electronically Signed   By: Darliss Cheney M.D.   On: 02/23/2023 18:12           LOS: 10 days   Time spent= 35 mins    Miguel Rota, MD [facility]  If 7PM-7AM, please contact night-coverage  02/25/2023, 11:10 AM

## 2023-02-26 DIAGNOSIS — M109 Gout, unspecified: Secondary | ICD-10-CM | POA: Diagnosis not present

## 2023-02-26 DIAGNOSIS — R400 Somnolence: Secondary | ICD-10-CM | POA: Diagnosis not present

## 2023-02-26 DIAGNOSIS — Z9889 Other specified postprocedural states: Secondary | ICD-10-CM | POA: Diagnosis not present

## 2023-02-26 DIAGNOSIS — Z66 Do not resuscitate: Secondary | ICD-10-CM | POA: Diagnosis not present

## 2023-02-26 DIAGNOSIS — M6281 Muscle weakness (generalized): Secondary | ICD-10-CM | POA: Diagnosis not present

## 2023-02-26 DIAGNOSIS — G3184 Mild cognitive impairment, so stated: Secondary | ICD-10-CM | POA: Diagnosis not present

## 2023-02-26 DIAGNOSIS — D72829 Elevated white blood cell count, unspecified: Secondary | ICD-10-CM | POA: Diagnosis not present

## 2023-02-26 DIAGNOSIS — K623 Rectal prolapse: Secondary | ICD-10-CM | POA: Diagnosis not present

## 2023-02-26 DIAGNOSIS — I2510 Atherosclerotic heart disease of native coronary artery without angina pectoris: Secondary | ICD-10-CM | POA: Diagnosis not present

## 2023-02-26 DIAGNOSIS — R918 Other nonspecific abnormal finding of lung field: Secondary | ICD-10-CM | POA: Diagnosis not present

## 2023-02-26 DIAGNOSIS — D631 Anemia in chronic kidney disease: Secondary | ICD-10-CM | POA: Diagnosis not present

## 2023-02-26 DIAGNOSIS — B0229 Other postherpetic nervous system involvement: Secondary | ICD-10-CM | POA: Diagnosis not present

## 2023-02-26 DIAGNOSIS — N185 Chronic kidney disease, stage 5: Secondary | ICD-10-CM | POA: Diagnosis not present

## 2023-02-26 DIAGNOSIS — D649 Anemia, unspecified: Secondary | ICD-10-CM | POA: Diagnosis not present

## 2023-02-26 DIAGNOSIS — I10 Essential (primary) hypertension: Secondary | ICD-10-CM | POA: Diagnosis not present

## 2023-02-26 DIAGNOSIS — B3749 Other urogenital candidiasis: Secondary | ICD-10-CM | POA: Diagnosis not present

## 2023-02-26 DIAGNOSIS — J9601 Acute respiratory failure with hypoxia: Secondary | ICD-10-CM | POA: Diagnosis not present

## 2023-02-26 DIAGNOSIS — R41841 Cognitive communication deficit: Secondary | ICD-10-CM | POA: Diagnosis not present

## 2023-02-26 DIAGNOSIS — R4182 Altered mental status, unspecified: Secondary | ICD-10-CM | POA: Diagnosis not present

## 2023-02-26 DIAGNOSIS — E039 Hypothyroidism, unspecified: Secondary | ICD-10-CM | POA: Diagnosis not present

## 2023-02-26 DIAGNOSIS — Z7401 Bed confinement status: Secondary | ICD-10-CM | POA: Diagnosis not present

## 2023-02-26 DIAGNOSIS — I160 Hypertensive urgency: Secondary | ICD-10-CM | POA: Diagnosis not present

## 2023-02-26 DIAGNOSIS — J432 Centrilobular emphysema: Secondary | ICD-10-CM | POA: Diagnosis not present

## 2023-02-26 DIAGNOSIS — R531 Weakness: Secondary | ICD-10-CM | POA: Diagnosis not present

## 2023-02-26 DIAGNOSIS — J189 Pneumonia, unspecified organism: Secondary | ICD-10-CM | POA: Diagnosis not present

## 2023-02-26 DIAGNOSIS — M069 Rheumatoid arthritis, unspecified: Secondary | ICD-10-CM | POA: Diagnosis not present

## 2023-02-26 DIAGNOSIS — K649 Unspecified hemorrhoids: Secondary | ICD-10-CM | POA: Diagnosis not present

## 2023-02-26 DIAGNOSIS — K922 Gastrointestinal hemorrhage, unspecified: Secondary | ICD-10-CM | POA: Diagnosis not present

## 2023-02-26 DIAGNOSIS — K5901 Slow transit constipation: Secondary | ICD-10-CM | POA: Diagnosis not present

## 2023-02-26 DIAGNOSIS — I120 Hypertensive chronic kidney disease with stage 5 chronic kidney disease or end stage renal disease: Secondary | ICD-10-CM | POA: Diagnosis not present

## 2023-02-26 DIAGNOSIS — E871 Hypo-osmolality and hyponatremia: Secondary | ICD-10-CM | POA: Diagnosis not present

## 2023-02-26 DIAGNOSIS — Z515 Encounter for palliative care: Secondary | ICD-10-CM | POA: Diagnosis not present

## 2023-02-26 DIAGNOSIS — E872 Acidosis, unspecified: Secondary | ICD-10-CM | POA: Diagnosis not present

## 2023-02-26 DIAGNOSIS — I161 Hypertensive emergency: Secondary | ICD-10-CM | POA: Diagnosis not present

## 2023-02-26 DIAGNOSIS — N184 Chronic kidney disease, stage 4 (severe): Secondary | ICD-10-CM | POA: Diagnosis present

## 2023-02-26 DIAGNOSIS — N179 Acute kidney failure, unspecified: Secondary | ICD-10-CM | POA: Diagnosis not present

## 2023-02-26 DIAGNOSIS — K219 Gastro-esophageal reflux disease without esophagitis: Secondary | ICD-10-CM | POA: Diagnosis not present

## 2023-02-26 LAB — BASIC METABOLIC PANEL
Anion gap: 14 (ref 5–15)
BUN: 100 mg/dL — ABNORMAL HIGH (ref 8–23)
CO2: 19 mmol/L — ABNORMAL LOW (ref 22–32)
Calcium: 10.2 mg/dL (ref 8.9–10.3)
Chloride: 96 mmol/L — ABNORMAL LOW (ref 98–111)
Creatinine, Ser: 4.66 mg/dL — ABNORMAL HIGH (ref 0.44–1.00)
GFR, Estimated: 9 mL/min — ABNORMAL LOW (ref >60)
Glucose, Bld: 81 mg/dL (ref 70–99)
Potassium: 4.3 mmol/L (ref 3.5–5.1)
Sodium: 129 mmol/L — ABNORMAL LOW (ref 135–145)

## 2023-02-26 LAB — CBC
HCT: 31.5 % — ABNORMAL LOW (ref 36.0–46.0)
Hemoglobin: 10.3 g/dL — ABNORMAL LOW (ref 12.0–15.0)
MCH: 31.6 pg (ref 26.0–34.0)
MCHC: 32.7 g/dL (ref 30.0–36.0)
MCV: 96.6 fL (ref 80.0–100.0)
Platelets: 366 10*3/uL (ref 150–400)
RBC: 3.26 MIL/uL — ABNORMAL LOW (ref 3.87–5.11)
RDW: 17.2 % — ABNORMAL HIGH (ref 11.5–15.5)
WBC: 10.7 10*3/uL — ABNORMAL HIGH (ref 4.0–10.5)
nRBC: 0.6 % — ABNORMAL HIGH (ref 0.0–0.2)

## 2023-02-26 LAB — MAGNESIUM: Magnesium: 2.2 mg/dL (ref 1.7–2.4)

## 2023-02-26 MED ORDER — MELATONIN 3 MG PO TABS: 3.0000 mg | ORAL_TABLET | Freq: Every evening | ORAL | Status: DC | PRN

## 2023-02-26 MED ORDER — HYDROXYZINE HCL 25 MG PO TABS: 25.0000 mg | ORAL_TABLET | Freq: Three times a day (TID) | ORAL | Status: DC | PRN

## 2023-02-26 MED ORDER — ALLOPURINOL 100 MG PO TABS: 100.0000 mg | ORAL_TABLET | Freq: Every day | ORAL | Status: DC

## 2023-02-26 MED ORDER — AMLODIPINE BESYLATE 10 MG PO TABS: 10.0000 mg | ORAL_TABLET | Freq: Every day | ORAL | Status: DC

## 2023-02-26 MED ORDER — DOCUSATE SODIUM 100 MG PO CAPS: 100.0000 mg | ORAL_CAPSULE | Freq: Two times a day (BID) | ORAL | Status: DC | PRN

## 2023-02-26 MED ORDER — B COMPLEX-C PO TABS: 1.0000 | ORAL_TABLET | Freq: Every day | ORAL | Status: DC

## 2023-02-26 MED ORDER — BISACODYL 5 MG PO TBEC: 10.0000 mg | DELAYED_RELEASE_TABLET | Freq: Every day | ORAL | Status: DC | PRN

## 2023-02-26 NOTE — Progress Notes (Signed)
Admit: 02/15/2023 LOS: 11  88F advanced CKD and HTN emergency  Subjective:  Stable today, no c/o, pending SNF for discharge  BPs labile but stable this AM No c/o SCr 4.7 stable, K 4.3, Na 129 Hb 10.3, stable  10/27 0701 - 10/28 0700 In: 600 [P.O.:600] Out: 250 [Urine:250]  Filed Weights   02/22/23 0456 02/24/23 0500 02/25/23 0500  Weight: 57.3 kg 57.9 kg 57.4 kg    Scheduled Meds:  acetaminophen  500 mg Oral TID   allopurinol  100 mg Oral Daily   amLODipine  10 mg Oral Daily   ascorbic acid  500 mg Oral Daily   atorvastatin  80 mg Oral Daily   B-complex with vitamin C  1 tablet Oral Daily   bisacodyl  10 mg Oral Q1500   carvedilol  6.25 mg Oral BID WC   Chlorhexidine Gluconate Cloth  6 each Topical Daily   cholecalciferol  2,000 Units Oral Daily   cloNIDine  0.2 mg Transdermal Q Thu   clopidogrel  75 mg Oral QODAY   cyanocobalamin  2,500 mcg Oral Daily   docusate sodium  100 mg Oral BID   feeding supplement  237 mL Oral BID BM   heparin  5,000 Units Subcutaneous Q8H   leflunomide  20 mg Oral Daily   levothyroxine  88 mcg Oral QAC breakfast   loratadine  10 mg Oral Daily   multivitamin with minerals  1 tablet Oral Daily   pantoprazole  40 mg Oral Daily   senna  1 tablet Oral Daily   sertraline  25 mg Oral Daily   triamcinolone  1 spray Nasal Daily   Continuous Infusions: PRN Meds:.acetaminophen, benzonatate, bisacodyl, docusate sodium, hydrALAZINE, hydrOXYzine, ipratropium-albuterol, melatonin, metoprolol tartrate, ondansetron (ZOFRAN) IV, mouth rinse, polyethylene glycol  Current Labs: reviewed   Physical Exam:  Blood pressure (!) 137/57, pulse 79, temperature 98 F (36.7 C), resp. rate 18, height 5\' 3"  (1.6 m), weight 57.4 kg, SpO2 95%. Elderly female in bed, NAD RRR CTAB Nonfocal, CN2-12 intact S/nt/nd No LEE  A HTN emergency, improved, chronic HTN CKD5, follows with [facility] at [facility], no uremia or indication for HD Anemia, Hb stable 10s on outpt  ESA Hyponatremia, mild, CTM CAD hx/o CABG RA on lefunamide  P OK for discharge on current BP regimen Will make sure has close f/u at [facility] Medication Issues; Preferred narcotic agents for pain control are hydromorphone, fentanyl, and methadone. Morphine should not be used.  Baclofen should be avoided Avoid oral sodium phosphate and magnesium citrate based laxatives / bowel preps    [name] 02/26/2023, 1:22 PM  Recent Labs  Lab 02/24/23 0435 02/25/23 0448 02/26/23 0416  NA 128* 127* 129*  K 4.9 4.6 4.3  CL 95* 97* 96*  CO2 19* 18* 19*  GLUCOSE 103* 94 81  BUN 106* 99* 100*  CREATININE 4.99* 4.57* 4.66*  CALCIUM 9.9 9.7 10.2   Recent Labs  Lab 02/24/23 0435 02/25/23 0448 02/26/23 0416  WBC 9.2 10.9* 10.7*  HGB 11.3* 10.6* 10.3*  HCT 33.5* 31.5* 31.5*  MCV 97.4 95.7 96.6  PLT 363 361 366

## 2023-02-26 NOTE — Discharge Summary (Signed)
Physician Discharge Summary  [name] XLK:[id number] DOB: [birth year] DOA: 02/15/2023  PCP: [name], MD  Admit date: 02/15/2023 Discharge date: 02/26/2023  Admitted From: Home Disposition: SNF  Recommendations for Outpatient Follow-up:  Follow up with PCP in 1-2 weeks Please obtain BMP/CBC in one week your next doctors visit.  Allopurinol was started due to concerns of gout/pseudogout Bowel regimen as needed Melatonin at bedtime as needed Norvasc increased to 10 mg daily.  Continue Coreg twice daily, clonidine 0.2 mg patch.  Losartan, torsemide has been discontinued. 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  Last known well around 10 PM the night prior to admission.  She wake up at 430 the day of admission and was noted to be confused.  She was noted to be hypertensive.  Systolic blood pressure 244.  She was noted to have subtle left facial droop, left upper extremity weakness.  CT head no acute finding.  She received IV labetalol and was placed on Cleviprex drip.  Neurology was consulted. Patient was transition to oral medication, care transferred to triad 10/20.  Since then blood pressure has remained quite labile.   Eventually with help of nephrology team patient's blood pressure medications were adjusted.  Currently on Norvasc 10 mg daily, Coreg and clonidine.  Overall she is doing better, renal function is stable and will need outpatient follow-up with nephrology.  PT recommending SNF.     Assessment & Plan:   Principal Problem:   Hypertensive urgency     Hypertensive emergency with hypertensive  encephalopathy, improving Uncontrolled hypertension, labile BP still remains labile but on the higher side now. Goal SBP around 150.  MRI brain negative.  EEG is negative. Current blood pressure regimen-Norvasc 10 mg daily (increased), Coreg 6.25 mg twice daily, clonidine 0.2 mg weekly Compression stocking   Gaseous abdominal distention, resolved - Some evidence of constipation.  CT abdomen pelvis shows possible proctitis otherwise chronic changes.  Ambulate as much as possible.  No signs of infection or diarrhea.  Advised her to avoid any constipation   Left facial droop likely from herpes zoster shingles resolving Currently on scheduled Tylenol   Right knee pain, effusion.  X ray large effusion, suspicious for tibia fracture.  CT is negative for any fracture, Ortho performed an arthrocentesis 10/22.  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  Received intra-articular steroid, started allopurinol   Hyponatremia;  Sodium is stable between 127-131   Hyperkalemia:  Closely monitor   Acute kidney injury on CKD stage IV: -Cr baseline 3.2--3.5.  Peaked at 5.1.  Labile BP likely is playing a role.  Hopefully we can stabilize blood pressure more.  Renal ultrasound is negative.  Nephrology is following.   UTI; klebsiella Symptomatic.  Completed 3 days of Rocephin on Oct 24th   CAD status post CABG Continue Plavix, Coreg, statin   Hyperlipidemia Lipitor   Constipation:  Bowel regimen     Rheumatoid arthritis History of DVT and PE but not on anticoagulation due to history of recurrent GI bleed       Estimated body mass index is 22.81 kg/m as calculated from the following:   Height as of this encounter: 5\' 3"  (1.6  m).   Weight as of this encounter: 58.4 kg.     DVT prophylaxis: Heparin Code Status: DNR Family Communication:  Disposition Plan:  Status is: Inpatient Remains inpatient appropriate because: Will await SNF placement  Patient needs SNF       Subjective: Doing well no complaints    Examination:   General exam: Appears calm and comfortable ; elderly frail Respiratory system: Clear to auscultation. Respiratory effort normal. Cardiovascular system: S1 & S2 heard, RRR. No JVD, murmurs, rubs, gallops or clicks. No pedal edema. Gastrointestinal system: Mild abdominal distention.  Positive bowel sounds. Central nervous system: Alert and oriented. No focal neurological deficits. Extremities: Symmetric 5 x 5 power. Skin: No rashes, lesions or ulcers Psychiatry: Judgement and insight appear normal. Mood & affect appropriate.       Discharge Exam: Vitals:   02/26/23 0512 02/26/23 0940  BP: (!) 181/64 (!) 147/92  Pulse:  83  Resp:  18  Temp:  98 F (36.7 C)  SpO2:  93%   Vitals:   02/25/23 1915 02/26/23 0506 02/26/23 0512 02/26/23 0940  BP: (!) 150/52 (!) 181/64 (!) 181/64 (!) 147/92  Pulse: 70 80  83  Resp: 19 19  18   Temp: 98.6 F (37 C) 97.6 F (36.4 C)  98 F (36.7 C)  TempSrc: Oral Oral    SpO2: 91% 92%  93%  Weight:      Height:         Discharge Instructions   Allergies as of 02/26/2023       Reactions   Nsaids Other (See Comments)   Stomach upset Told to avoid due to Eliquis   Aspirin Other (See Comments)   Stomach upset Told to avoid due to Eliquis   Codeine Nausea And Vomiting   Eliquis [apixaban] Other (See Comments)   Gi upset    Nitrostat [nitroglycerin] Other (See Comments)   Bradycardia. Drop in heart rate    Tramadol Other (See Comments)   Confusion        Medication List     STOP taking these medications    cloNIDine 0.2 MG tablet Commonly known as: CATAPRES   losartan 50 MG tablet Commonly known as: COZAAR   torsemide 20 MG tablet Commonly known as: DEMADEX   traMADol 50 MG tablet Commonly known as: ULTRAM       TAKE these medications    acetaminophen 325 MG tablet Commonly known as: TYLENOL Take 2 tablets (650 mg total) by mouth 2 (two) times daily as  needed (generalized pain).   albuterol 108 (90 Base) MCG/ACT inhaler Commonly known as: VENTOLIN HFA Inhale 1-2 puffs into the lungs every 4 (four) hours as needed for wheezing or shortness of breath. What changed: when to take this   allopurinol 100 MG tablet Commonly known as: ZYLOPRIM Take 1 tablet (100 mg total) by mouth daily. Start taking on: February 27, 2023   amLODipine 10 MG tablet Commonly known as: NORVASC Take 1 tablet (10 mg total) by mouth daily. What changed:  medication strength how much to take   ascorbic acid 500 MG tablet Commonly known as: VITAMIN C Take 1 tablet (500 mg total) by mouth daily.   atorvastatin 80 MG tablet Commonly known as: LIPITOR Take 1 tablet (80 mg total) by mouth daily.   B-complex with vitamin C tablet Take 1 tablet by mouth daily.   bisacodyl 5 MG EC tablet Commonly known as: DULCOLAX Take 2 tablets (10 mg total) by mouth daily as needed  for moderate constipation or severe constipation.   carvedilol 6.25 MG tablet Commonly known as: COREG Take 1 tablet (6.25 mg total) by mouth 2 (two) times daily with a meal.   CENTRUM ADULT PO Take 1 tablet by mouth daily.   cloNIDine 0.2 mg/24hr patch Commonly known as: CATAPRES - Dosed in mg/24 hr Place 1 patch (0.2 mg total) onto the skin once a week.   clopidogrel 75 MG tablet Commonly known as: PLAVIX Take 1 tablet (75 mg total) by mouth every other day.   Cyanocobalamin 2500 MCG Tabs Take 2,500 mcg by mouth daily.   docusate sodium 100 MG capsule Commonly known as: COLACE Take 1 capsule (100 mg total) by mouth 2 (two) times daily as needed for mild constipation.   hydrOXYzine 25 MG tablet Commonly known as: ATARAX Take 1 tablet (25 mg total) by mouth 3 (three) times daily as needed for itching.   leflunomide 20 MG tablet Commonly known as: ARAVA Take 1 tablet (20 mg total) by mouth daily.   levothyroxine 88 MCG tablet Commonly known as: SYNTHROID Take 1 tablet (88 mcg  total) by mouth daily before breakfast.   melatonin 3 MG Tabs tablet Take 1 tablet (3 mg total) by mouth at bedtime as needed.   pantoprazole 40 MG tablet Commonly known as: PROTONIX Take 1 tablet (40 mg total) by mouth daily. What changed: when to take this   sertraline 50 MG tablet Commonly known as: ZOLOFT Take 0.5 tablets (25 mg total) by mouth daily.   triamcinolone 55 MCG/ACT Aero nasal inhaler Commonly known as: NASACORT Place 1 spray into the nose daily. What changed:  when to take this reasons to take this   valACYclovir 1000 MG tablet Commonly known as: Valtrex Take 1 tablet (1,000 mg total) by mouth 3 (three) times daily.   Vitamin D3 50 MCG (2000 UT) capsule Take 1 capsule (2,000 Units total) by mouth daily.        Contact information for follow-up providers     Plotnikov, Georgina Quint, MD Follow up in 1 week(s).   Specialty: Internal Medicine Contact information: [address] [place] [address] [phone]              Contact information for after-discharge care     Destination     [facility] .   Service: Skilled Nursing Contact information: [address] [place] [address] [phone]                    Allergies  Allergen Reactions   Nsaids Other (See Comments)    Stomach upset Told to avoid due to Eliquis   Aspirin Other (See Comments)    Stomach upset Told to avoid due to Eliquis   Codeine Nausea And Vomiting   Eliquis [Apixaban] Other (See Comments)    Gi upset    Nitrostat [Nitroglycerin] Other (See Comments)    Bradycardia. Drop in heart rate    Tramadol Other (See Comments)    Confusion    You were cared for by a hospitalist during your hospital stay. If you have any questions about your discharge medications or the care you received while you were in the hospital after you are discharged, you can call the unit and asked to speak with the hospitalist on call if  the hospitalist that took care of you is not available. Once you are discharged, your primary care physician will handle any further medical issues. Please note  that no refills for any discharge medications will be authorized once you are discharged, as it is imperative that you return to your primary care physician (or establish a relationship with a primary care physician if you do not have one) for your aftercare needs so that they can reassess your need for medications and monitor your lab values.  You were cared for by a hospitalist during your hospital stay. If you have any questions about your discharge medications or the care you received while you were in the hospital after you are discharged, you can call the unit and asked to speak with the hospitalist on call if the hospitalist that took care of you is not available. Once you are discharged, your primary care physician will handle any further medical issues. Please note that NO REFILLS for any discharge medications will be authorized once you are discharged, as it is imperative that you return to your primary care physician (or establish a relationship with a primary care physician if you do not have one) for your aftercare needs so that they can reassess your need for medications and monitor your lab values.  Please request your Prim.MD to go over all Hospital Tests and Procedure/Radiological results at the follow up, please get all Hospital records sent to your Prim MD by signing hospital release before you go home.  Get CBC, CMP, 2 view Chest X ray checked  by Primary MD during your next visit or SNF MD in 5-7 days ( we routinely change or add medications that can affect your baseline labs and fluid status, therefore we recommend that you get the mentioned basic workup next visit with your PCP, your PCP may decide not to get them or add new tests based on their clinical decision)  On your next visit with your primary care physician please Get  Medicines reviewed and adjusted.  If you experience worsening of your admission symptoms, develop shortness of breath, life threatening emergency, suicidal or homicidal thoughts you must seek medical attention immediately by calling 911 or calling your MD immediately  if symptoms less severe.  You Must read complete instructions/literature along with all the possible adverse reactions/side effects for all the Medicines you take and that have been prescribed to you. Take any new Medicines after you have completely understood and accpet all the possible adverse reactions/side effects.   Do not drive, operate heavy machinery, perform activities at heights, swimming or participation in water activities or provide baby sitting services if your were admitted for syncope or siezures until you have seen by Primary MD or a Neurologist and advised to do so again.  Do not drive when taking Pain medications.   Procedures/Studies: CT ABDOMEN PELVIS WO CONTRAST  Result Date: 02/24/2023 CLINICAL DATA:  Abdominal pain, acute, nonlocalized. Stomach distention. EXAM: CT ABDOMEN AND PELVIS WITHOUT CONTRAST TECHNIQUE: Multidetector CT imaging of the abdomen and pelvis was performed following the standard protocol without IV contrast. RADIATION DOSE REDUCTION: This exam was performed according to the departmental dose-optimization program which includes automated exposure control, adjustment of the mA and/or kV according to patient size and/or use of iterative reconstruction technique. COMPARISON:  01/22/2012. FINDINGS: Lower chest: The heart is enlarged. There is a trace left pleural effusion. Atelectasis or scarring is noted at the lung bases bilaterally. Hepatobiliary: The left lobe of the liver has a nodular contour, suggesting underlying cirrhosis. No focal abnormality is seen in the liver. No biliary ductal dilatation. Stones are present within the gallbladder. Pancreas: Unremarkable. No  pancreatic ductal dilatation  or surrounding inflammatory changes. Spleen: Surgically absent. Adrenals/Urinary Tract: Adrenal glands are within normal limits. Cysts are present in the kidneys bilaterally. No renal calculus or hydronephrosis bilaterally. The visualized portion of the urinary bladder is within normal limits. Stomach/Bowel: There is a small hiatal hernia. Stomach is within normal limits. Appendix is not seen. There is diffuse rectal wall thickening with surrounding fat stranding. Scattered diverticula are present along the colon without evidence of diverticulitis. No free air or pneumatosis. Vascular/Lymphatic: Aortic atherosclerosis. No enlarged abdominal or pelvic lymph nodes. Reproductive: Uterus and bilateral adnexa are unremarkable. Other: No abdominopelvic ascites. Musculoskeletal: Degenerative changes are present in the thoracolumbar spine. Total hip arthroplasty changes are present on the left. No acute osseous abnormality. IMPRESSION: 1. Marked rectal wall thickening with surrounding fat stranding suggesting proctitis. 2. Trace left pleural effusion with atelectasis at the lung bases. 3. Small hiatal hernia. 4. Diverticulosis without diverticulitis. 5. Nodular contour of the left lobe of the liver suggesting cirrhosis. 6. Cholelithiasis. 7. Aortic atherosclerosis Electronically Signed   By: Thornell Sartorius M.D.   On: 02/24/2023 21:53   DG Abd 1 View  Result Date: 02/23/2023 CLINICAL DATA:  Acute abdominal pain EXAM: ABDOMEN - 1 VIEW COMPARISON:  Abdominal x-ray 09/21/2022 FINDINGS: There is gaseous distention of the stomach. Air seen throughout nondilated colon and small bowel to the level the rectum. There are no suspicious calcifications. There is dextroconvex curvature of the lumbar spine with degenerative change. Left hip arthroplasty is present. IMPRESSION: Gaseous distention of the stomach. Nonobstructive bowel gas pattern. Electronically Signed   By: Darliss Cheney M.D.   On: 02/23/2023 18:12   CT KNEE RIGHT WO  CONTRAST  Result Date: 02/19/2023 CLINICAL DATA:  Knee pain EXAM: CT OF THE RIGHT KNEE WITHOUT CONTRAST TECHNIQUE: Multidetector CT imaging of the right knee was performed according to the standard protocol. Multiplanar CT image reconstructions were also generated. RADIATION DOSE REDUCTION: This exam was performed according to the departmental dose-optimization program which includes automated exposure control, adjustment of the mA and/or kV according to patient size and/or use of iterative reconstruction technique. COMPARISON:  02/18/2023 radiographs FINDINGS: Bones/Joint/Cartilage Meniscal chondrocalcinosis. Large knee joint effusion. Cortical thickening posterolaterally in the lateral tibial plateau and posterolaterally along the lateral femoral condyle, possibly degenerative although subcortical stress fractures are not excluded. There is some widening of the lateral compartment posteriorly raise the possibility of a prior compression of the lateral tibial plateau but no well-defined acute cortical fracture is identified. Tricompartmental marginal spurring noted. Ligaments Suboptimally assessed by CT. Muscles and Tendons Unremarkable Soft tissues Moderate-sized Baker's cyst. Distal SFA and popliteal artery atheromatous vascular calcification. Subcutaneous edema anterior to the junction of the quadriceps tendon and lateral patellar retinaculum. IMPRESSION: 1. Large knee joint effusion. 2. Cortical thickening posterolaterally in the lateral tibial plateau and posterolaterally along the lateral femoral condyle, possibly degenerative although subcortical stress fractures are not excluded. There is some widening of the lateral compartment posteriorly raise the possibility of a prior compression of the lateral tibial plateau but no well-defined acute cortical fracture is identified. 3. 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 4. Moderate-sized Baker's cyst. 5. Subcutaneous edema anterior  to the junction of the quadriceps tendon and lateral patellar retinaculum. 6. Distal SFA and popliteal artery atheromatous vascular calcification. Electronically Signed   By: Gaylyn Rong M.D.   On: 02/19/2023 13:33   DG Knee 1-2 Views Right  Result Date: 02/18/2023 CLINICAL DATA:  Right knee pain and swelling following fall,  initial encounter EXAM: RIGHT KNEE - 2 VIEW COMPARISON:  08/02/2022 FINDINGS: Tricompartmental degenerative changes are noted. There is suggestion of mildly displaced fracture through the intercondylar eminence of the proximal tibia best seen on the lateral projection. Cross-sectional imaging may be helpful for further evaluation. Large suprapatellar joint effusion is seen. IMPRESSION: Large joint effusion with findings suspicious for proximal tibial fracture. CT may be helpful when clinically able. Electronically Signed   By: Alcide Clever M.D.   On: 02/18/2023 22:12   US RENAL  Result Date: 02/18/2023 CLINICAL DATA:  Acute kidney injury. EXAM: RENAL / URINARY TRACT ULTRASOUND COMPLETE COMPARISON:  CT 12/08/2022, renal ultrasound 07/11/2022 FINDINGS: Right Kidney: Renal measurements: 8.1 x 4 x 3.3 cm = volume: 55 mL. Renal parenchymal atrophy and increased echogenicity. Simple cyst measures 3.6 cm. Additional smaller cysts. No hydronephrosis. Left Kidney: Renal measurements: 7.2 x 3.3 x 3.6 cm = volume: 44 mL. Renal parenchymal atrophy and increased echogenicity. There are multiple simple cysts, which were better appreciated on prior CT. No hydronephrosis. Bladder: Only minimally distended. Appears normal for degree of bladder distention. Other: None. IMPRESSION: 1. No obstructive uropathy. 2. Renal parenchymal atrophy and findings of chronic medical renal disease. Electronically Signed   By: Narda Rutherford M.D.   On: 02/18/2023 14:02   EEG adult  Result Date: 02/15/2023 Charlsie Quest, MD     02/15/2023  4:07 PM Patient Name: CARONDA VENDETTI MRN: [id number] Epilepsy  Attending: Charlsie Quest Referring Physician/Provider: Lynnell Catalan, MD Date: 02/15/2023 Duration: 22.26 mins Patient history: 82 yo F with acutely altered mental status with associated hypertensive crisis, but without visible seizure activity and only left facial droop as a consistent focal deficit. EEG to evaluate for seizure Level of alertness: Awake AEDs during EEG study: None Technical aspects: This EEG study was done with scalp electrodes positioned according to the 10-20 International system of electrode placement. Electrical activity was reviewed with band pass filter of 1-70Hz , sensitivity of 7 uV/mm, display speed of 63mm/sec with a 60Hz  notched filter applied as appropriate. EEG data were recorded continuously and digitally stored.  Video monitoring was available and reviewed as appropriate. Description: EEG showed continuous generalized predominantly 5 to 6 Hz theta slowing admixed with intermittent 2-3hz  delta slowing. Hyperventilation and photic stimulation were not performed.   ABNORMALITY - Continuous slow, generalized IMPRESSION: This study is suggestive of mild to moderate diffuse encephalopathy. No seizures or epileptiform discharges were seen throughout the recording. Charlsie Quest   MR BRAIN WO CONTRAST  Result Date: 02/15/2023 CLINICAL DATA:  Mental status change, unknown cause. EXAM: MRI HEAD WITHOUT CONTRAST TECHNIQUE: Multiplanar, multiecho pulse sequences of the brain and surrounding structures were obtained without intravenous contrast. COMPARISON:  Head CT February 15, 2023. FINDINGS: Brain: No acute infarction, hemorrhage, extra-axial collection or mass lesion. Moderate supratentorial and prominence of the cerebral sulci related to parenchymal volume loss. Minimal chronic white matter. Vascular: Normal flow voids.  Hypoplastic vertebral. Skull and upper cervical spine: Postsurgical changes in the visualized upper cervical spine. No focal identified. Sinuses/Orbits: Mild  mucosal thickening of the bilateral mastoids and ethmoid cells. Bilateral lens surgery. Other: None. IMPRESSION: 1. No acute intracranial abnormality. 2. Moderate parenchymal volume loss. Electronically Signed   By: Baldemar Lenis M.D.   On: 02/15/2023 15:22   DG Chest Port 1 View  Result Date: 02/15/2023 CLINICAL DATA:  82 year old female unresponsive patient. EXAM: PORTABLE CHEST 1 VIEW COMPARISON:  Chest x-ray 12/10/2022. FINDINGS: Lung volumes are low. No  consolidative airspace disease. No pleural effusions. No pneumothorax. No evidence of pulmonary edema. Heart size is normal. Upper mediastinal contours are within normal limits. Status post median sternotomy for CABG. Atherosclerotic calcifications are noted in the thoracic aorta. IMPRESSION: 1. Low lung volumes without radiographic evidence of acute cardiopulmonary disease. 2. Aortic atherosclerosis. Electronically Signed   By: Trudie Reed M.D.   On: 02/15/2023 07:33   CT HEAD WO CONTRAST  Result Date: 02/15/2023 CLINICAL DATA:  Lethargy.  Left-sided facial droop and weakness. EXAM: CT HEAD WITHOUT CONTRAST CT CERVICAL SPINE WITHOUT CONTRAST TECHNIQUE: Multidetector CT imaging of the head and cervical spine was performed following the standard protocol without intravenous contrast. Multiplanar CT image reconstructions of the cervical spine were also generated. RADIATION DOSE REDUCTION: This exam was performed according to the departmental dose-optimization program which includes automated exposure control, adjustment of the mA and/or kV according to patient size and/or use of iterative reconstruction technique. COMPARISON:  08/02/2022 FINDINGS: CT HEAD FINDINGS Brain: No evidence of acute infarction, hemorrhage, hydrocephalus, extra-axial collection or mass lesion/mass effect. Generalized brain atrophy in keeping with age Vascular: No hyperdense vessel or unexpected calcification. Skull: Normal. Negative for fracture or focal  lesion. Sinuses/Orbits: No acute finding. CT CERVICAL SPINE FINDINGS Alignment: Normal. Skull base and vertebrae: No acute fracture. No primary bone lesion or focal pathologic process. C3-C5 ACDF with solid arthrodesis Soft tissues and spinal canal: No prevertebral fluid or swelling. No visible canal hematoma. Disc levels: Disc collapse and endplate spurring especially at C5-6 to C6-7. 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 Upper chest: No visible injury IMPRESSION: No acute finding in the head or cervical spine. No specific cause for symptoms. Electronically Signed   By: Tiburcio Pea M.D.   On: 02/15/2023 06:57   CT CERVICAL SPINE WO CONTRAST  Result Date: 02/15/2023 CLINICAL DATA:  Lethargy.  Left-sided facial droop and weakness. EXAM: CT HEAD WITHOUT CONTRAST CT CERVICAL SPINE WITHOUT CONTRAST TECHNIQUE: Multidetector CT imaging of the head and cervical spine was performed following the standard protocol without intravenous contrast. Multiplanar CT image reconstructions of the cervical spine were also generated. RADIATION DOSE REDUCTION: This exam was performed according to the departmental dose-optimization program which includes automated exposure control, adjustment of the mA and/or kV according to patient size and/or use of iterative reconstruction technique. COMPARISON:  08/02/2022 FINDINGS: CT HEAD FINDINGS Brain: No evidence of acute infarction, hemorrhage, hydrocephalus, extra-axial collection or mass lesion/mass effect. Generalized brain atrophy in keeping with age Vascular: No hyperdense vessel or unexpected calcification. Skull: Normal. Negative for fracture or focal lesion. Sinuses/Orbits: No acute finding. CT CERVICAL SPINE FINDINGS Alignment: Normal. Skull base and vertebrae: No acute fracture. No primary bone lesion or focal pathologic process. C3-C5 ACDF with solid arthrodesis Soft tissues and spinal canal: No prevertebral fluid or swelling. No visible canal hematoma. Disc  levels: Disc collapse and endplate spurring especially at C5-6 to C6-7. 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 Upper chest: No visible injury IMPRESSION: No acute finding in the head or cervical spine. No specific cause for symptoms. Electronically Signed   By: Tiburcio Pea M.D.   On: 02/15/2023 06:57     The results of significant diagnostics from this hospitalization (including imaging, microbiology, ancillary and laboratory) are listed below for reference.     Microbiology: Recent Results (from the past 240 hour(s))  Body fluid culture w Gram Stain     Status: None   Collection Time: 02/19/23  1:45 PM   Specimen: Synovium;  Body Fluid  Result Value Ref Range Status   Specimen Description SYNOVIAL  Final   Special Requests right knee  Final   Gram Stain   Final    FEW WBC PRESENT, PREDOMINANTLY PMN NO ORGANISMS SEEN    Culture   Final    NO GROWTH 3 DAYS Performed at [facility], [address] [place], [place] [address]    Report Status 02/23/2023 FINAL  Final  Culture, OB Urine     Status: Abnormal   Collection Time: 02/20/23 11:10 AM   Specimen: Urine, Random  Result Value Ref Range Status   Specimen Description URINE, RANDOM  Final   Special Requests NONE  Final   Culture (A)  Final    >=100,000 COLONIES/mL KLEBSIELLA PNEUMONIAE NO GROUP B STREP (S.AGALACTIAE) ISOLATED Performed at [facility], [address] [place], [place] [address]    Report Status 02/23/2023 FINAL  Final   Organism ID, Bacteria KLEBSIELLA PNEUMONIAE (A)  Final      Susceptibility   Klebsiella pneumoniae - MIC*    AMPICILLIN RESISTANT Resistant     CEFEPIME <=0.12 SENSITIVE Sensitive     CEFTRIAXONE <=0.25 SENSITIVE Sensitive     CIPROFLOXACIN <=0.25 SENSITIVE Sensitive     GENTAMICIN <=1 SENSITIVE Sensitive     IMIPENEM <=0.25 SENSITIVE Sensitive     NITROFURANTOIN 64 INTERMEDIATE Intermediate     TRIMETH/SULFA <=20 SENSITIVE Sensitive     AMPICILLIN/SULBACTAM 4  SENSITIVE Sensitive     PIP/TAZO <=4 SENSITIVE Sensitive ug/mL    * >=100,000 COLONIES/mL KLEBSIELLA PNEUMONIAE     Labs: BNP (last 3 results) Recent Labs    07/08/22 0553 07/12/22 0826 02/15/23 0550  BNP 382.7* 865.3* 211.0*   Basic Metabolic Panel: Recent Labs  Lab 02/22/23 0434 02/23/23 0433 02/24/23 0435 02/25/23 0448 02/26/23 0416  NA 129* 128* 128* 127* 129*  K 4.7 4.6 4.9 4.6 4.3  CL 97* 96* 95* 97* 96*  CO2 18* 18* 19* 18* 19*  GLUCOSE 116* 98 103* 94 81  BUN 97* 99* 106* 99* 100*  CREATININE 5.07* 5.16* 4.99* 4.57* 4.66*  CALCIUM 9.1 9.3 9.9 9.7 10.2  MG 2.2 2.0 2.2 2.2 2.2   Liver Function Tests: No results for input(s): "AST", "ALT", "ALKPHOS", "BILITOT", "PROT", "ALBUMIN" in the last 168 hours. No results for input(s): "LIPASE", "AMYLASE" in the last 168 hours. No results for input(s): "AMMONIA" in the last 168 hours. CBC: Recent Labs  Lab 02/22/23 0434 02/23/23 0433 02/24/23 0435 02/25/23 0448 02/26/23 0416  WBC 12.1* 10.1 9.2 10.9* 10.7*  HGB 11.5* 10.9* 11.3* 10.6* 10.3*  HCT 34.3* 33.0* 33.5* 31.5* 31.5*  MCV 97.7 97.3 97.4 95.7 96.6  PLT 313 351 363 361 366   Cardiac Enzymes: No results for input(s): "CKTOTAL", "CKMB", "CKMBINDEX", "TROPONINI" in the last 168 hours. BNP: Invalid input(s): "POCBNP" CBG: No results for input(s): "GLUCAP" in the last 168 hours. D-Dimer No results for input(s): "DDIMER" in the last 72 hours. Hgb A1c No results for input(s): "HGBA1C" in the last 72 hours. Lipid Profile No results for input(s): "CHOL", "HDL", "LDLCALC", "TRIG", "CHOLHDL", "LDLDIRECT" in the last 72 hours. Thyroid function studies No results for input(s): "TSH", "T4TOTAL", "T3FREE", "THYROIDAB" in the last 72 hours.  Invalid input(s): "FREET3" Anemia work up No results for input(s): "VITAMINB12", "FOLATE", "FERRITIN", "TIBC", "IRON", "RETICCTPCT" in the last 72 hours. Urinalysis    Component Value Date/Time   COLORURINE YELLOW  02/20/2023 1223   APPEARANCEUR CLOUDY (A) 02/20/2023 1223  LABSPEC 1.009 02/20/2023 1223   PHURINE 5.0 02/20/2023 1223   GLUCOSEU NEGATIVE 02/20/2023 1223   GLUCOSEU NEGATIVE 09/05/2022 1155   HGBUR SMALL (A) 02/20/2023 1223   BILIRUBINUR NEGATIVE 02/20/2023 1223   KETONESUR NEGATIVE 02/20/2023 1223   PROTEINUR 100 (A) 02/20/2023 1223   UROBILINOGEN 0.2 09/05/2022 1155   NITRITE NEGATIVE 02/20/2023 1223   LEUKOCYTESUR LARGE (A) 02/20/2023 1223   Sepsis Labs Recent Labs  Lab 02/23/23 0433 02/24/23 0435 02/25/23 0448 02/26/23 0416  WBC 10.1 9.2 10.9* 10.7*   Microbiology Recent Results (from the past 240 hour(s))  Body fluid culture w Gram Stain     Status: None   Collection Time: 02/19/23  1:45 PM   Specimen: Synovium; Body Fluid  Result Value Ref Range Status   Specimen Description SYNOVIAL  Final   Special Requests right knee  Final   Gram Stain   Final    FEW WBC PRESENT, PREDOMINANTLY PMN NO ORGANISMS SEEN    Culture   Final    NO GROWTH 3 DAYS Performed at [facility], [address], [place], [place] [address]    Report Status 02/23/2023 FINAL  Final  Culture, OB Urine     Status: Abnormal   Collection Time: 02/20/23 11:10 AM   Specimen: Urine, Random  Result Value Ref Range Status   Specimen Description URINE, RANDOM  Final   Special Requests NONE  Final   Culture (A)  Final    >=100,000 COLONIES/mL KLEBSIELLA PNEUMONIAE NO GROUP B STREP (S.AGALACTIAE) ISOLATED Performed at [facility], [address] [place], [place] [address]    Report Status 02/23/2023 FINAL  Final   Organism ID, Bacteria KLEBSIELLA PNEUMONIAE (A)  Final      Susceptibility   Klebsiella pneumoniae - MIC*    AMPICILLIN RESISTANT Resistant     CEFEPIME <=0.12 SENSITIVE Sensitive     CEFTRIAXONE <=0.25 SENSITIVE Sensitive     CIPROFLOXACIN <=0.25 SENSITIVE Sensitive     GENTAMICIN <=1 SENSITIVE Sensitive     IMIPENEM <=0.25 SENSITIVE Sensitive     NITROFURANTOIN 64  INTERMEDIATE Intermediate     TRIMETH/SULFA <=20 SENSITIVE Sensitive     AMPICILLIN/SULBACTAM 4 SENSITIVE Sensitive     PIP/TAZO <=4 SENSITIVE Sensitive ug/mL    * >=100,000 COLONIES/mL KLEBSIELLA PNEUMONIAE     Time coordinating discharge:  I have spent 35 minutes face to face with the patient and on the ward discussing the patients care, assessment, plan and disposition with other care givers. >50% of the time was devoted counseling the patient about the risks and benefits of treatment/Discharge disposition and coordinating care.   SIGNED:   Miguel Rota, MD  [facility] 02/26/2023, 11:05 AM   If 7PM-7AM, please contact night-coverage

## 2023-02-26 NOTE — Plan of Care (Signed)

## 2023-02-26 NOTE — Progress Notes (Signed)
Physical Therapy Treatment Patient Details Name: [name] MRN: [id number] DOB: [birth year] Today's Date: 02/26/2023   History of Present Illness 82 y.o. female presents to [facility] hospital on 10/17 with altered mental status with associated hypertensive crisis. AKI on CKD stage IV. MI negative for acute findings, found to have herpes zoster shingles incidentally. 10/22 s/p R knee aspiration and steroid injection by orthopedic PA. PMH includes CKD IV, HTN, RA, hypothyroidism, CABG, COPD, HLD, PE.   Pt received in chair, requesting assist to get up and mobilize to bathroom, RN/NT not available at time of session and pt with bowel urgency, so PTA saw pt again with her agreement to work on safety with gait/transfers and self-care along with close VS monitoring given pt increased fatigue. Pt found to have asymptomatic orthostatic hypotension (despite BLE ace wraps from feet to just below knees) and would benefit from abdominal binder and BLE compression socks for future PT sessions to try to keep BP more stable, RN notified. Pt reports 5/10 modified RPE (Fatigue) at end of session. Up to minA for safety with transfers and gait and pt needing assist for completion of posterior hygiene after toileting. Pt continues to benefit from PT services to progress toward functional mobility goals.   02/26/23 1348    PT Comments  Orthostatic Sitting  BP- Sitting 125/89  Pulse- Sitting 77  Orthostatic Standing at 0 minutes  BP- Standing at 0 minutes (!) 66/39  Pulse- Standing at 0 minutes 82    02/26/23 1348  Orthostatic Sitting  BP- Sitting 117/58 (after standing)  Pulse- Sitting 80     If plan is discharge home, recommend the following: A lot of help with walking and/or transfers;A lot of help with bathing/dressing/bathroom;Assistance with cooking/housework;Direct supervision/assist for medications management;Direct supervision/assist for financial management;Assist for transportation;Help with stairs or  ramp for entrance   Can travel by private vehicle     Yes  Equipment Recommendations  None recommended by PT (defer to post-acute therapy setting)    Recommendations for Other Services       Precautions / Restrictions Precautions Precautions: Fall Precaution Comments: fall and hit head prior to admission; SBP goal <150 per MD (permissive HTN due to recent orthostatic hypotension) Required Braces or Orthoses:  (BLE ace wraps donned, no compression socks found in her room to put on for OOB) Restrictions Weight Bearing Restrictions: No     Mobility  Bed Mobility Overal bed mobility: Needs Assistance Bed Mobility: Sit to Sidelying, Rolling Rolling: Supervision, Used rails       Sit to sidelying: Min assist, Used rails General bed mobility comments: BLE assist to return to sidelying, pt using rail, pt then rolled from R to her L side for placement of pillow for pressure relief.    Transfers Overall transfer level: Needs assistance Equipment used: Rolling walker (2 wheels) Transfers: Sit to/from Stand, Bed to chair/wheelchair/BSC Sit to Stand: Min assist           General transfer comment: MinA for stand>sit safety and hand over hand assist to reach for arm rests of BSC due to pt bowel urgency/anxiety is less safe to sit when fatigued.    Ambulation/Gait Ambulation/Gait assistance: Min assist Gait Distance (Feet): 22 Feet (x2 with seated break on BSC) Assistive device: Rolling walker (2 wheels) Gait Pattern/deviations: Step-through pattern, Drifts right/left, Trunk flexed       General Gait Details: Cues for posture/forward gaze, up to minA for RW safety backing up to [facility] and stability, pt without  c/o dizziness but when BP checked after trials, pt found to be very orthostatic, RN notified. Distance limited due to asymptomatic orthostatic hypotension.   Stairs             Wheelchair Mobility     Tilt Bed    Modified Rankin (Stroke Patients Only)        Balance Overall balance assessment: Needs assistance Sitting-balance support: No upper extremity supported, Feet supported Sitting balance-Leahy Scale: Good     Standing balance support: Reliant on assistive device for balance, Single extremity supported Standing balance-Leahy Scale: Fair Standing balance comment: Fair static stand at RW, needs CGA for gait safety with AD. Poor dynamic balance when unsupported.                            Cognition Arousal: Alert Behavior During Therapy: WFL for tasks assessed/performed Overall Cognitive Status: Within Functional Limits for tasks assessed                                 General Comments: Pt following 1-step commands well        Exercises General Exercises - Lower Extremity Ankle Circles/Pumps: AROM, Both, 10 reps, Supine Long Arc Quad: AROM, Both, 10 reps, Seated Hip Flexion/Marching: AROM, Both, 10 reps, Seated Other Exercises Other Exercises: IS x 10 reps pt ~1250-1400 mL, encouraged hourly use x10 reps    General Comments General comments (skin integrity, edema, etc.): see orthostatic BPs in comments above; pt denies symptoms of dizziness, low back discomfort or BLE fatigue while standing during gait or BP assessment; SpO2/HR WFL on RA second session today.      Pertinent Vitals/Pain Pain Assessment Pain Assessment: Faces Faces Pain Scale: Hurts little more Pain Location: bottom with posterior peri-care Pain Descriptors / Indicators: Grimacing, Discomfort Pain Intervention(s): Monitored during session, Repositioned, Limited activity within patient's tolerance (pt able to perform her own anterior peri care, needs assist with posterior peri-care after toileting and c/o pain from hemorrhoids. She reports barrier ointment seems to help so ointment reapplied after clean-up)    Home Living                          Prior Function            PT Goals (current goals can now be found in the  care plan section) Acute Rehab PT Goals Patient Stated Goal: none stated by pt, per son for her to get stronger and to move better so she can be more independent PT Goal Formulation: With patient/family Time For Goal Achievement: 03/04/23 Progress towards PT goals: Progressing toward goals    Frequency    Min 1X/week      PT Plan      Co-evaluation              AM-PAC PT "6 Clicks" Mobility   Outcome Measure  Help needed turning from your back to your side while in a flat bed without using bedrails?: A Little Help needed moving from lying on your back to sitting on the side of a flat bed without using bedrails?: A Little Help needed moving to and from a bed to a chair (including a wheelchair)?: A Little Help needed standing up from a chair using your arms (e.g., wheelchair or bedside chair)?: A Little Help needed to walk in hospital room?: A  Little Help needed climbing 3-5 steps with a railing? : Total (defer-unsafe, BP drop in standing) 6 Click Score: 16    End of Session Equipment Utilized During Treatment: Gait belt Activity Tolerance: Patient tolerated treatment well;Other (comment);Treatment limited secondary to medical complications (Comment) (asymptomatic orthostatic hypotension) Patient left: with call bell/phone within reach;with family/visitor present;in bed;with bed alarm set;Other (comment) (semi-sidelying, pillow behind hip/lower back to offload) Nurse Communication: Mobility status;Other (comment) (rectal pain, asymptomatic orthostatic hypotension, needs abdominal binder and compression socks for BLE) PT Visit Diagnosis: History of falling (Z91.81);Muscle weakness (generalized) (M62.81);Difficulty in walking, not elsewhere classified (R26.2);Other symptoms and signs involving the nervous system (R29.898);Pain Pain - part of body:  (bottom/rectal area)     Time: 6045-4098 PT Time Calculation (min) (ACUTE ONLY): 27 min  Charges:    $Gait Training: 8-22  mins $Therapeutic Exercise: 8-22 mins $Therapeutic Activity: 8-22 mins PT General Charges $$ ACUTE PT VISIT: 1 Visit                     Sausha Raymond P., PTA Acute Rehabilitation Services Secure Chat Preferred 9a-5:30pm Office: [phone]    [place] 02/26/2023, 3:21 PM

## 2023-02-26 NOTE — Progress Notes (Addendum)
Patient discharging to [facility]. Report called to Zella Ball, RN.

## 2023-02-26 NOTE — TOC Transition Note (Signed)
Transition of Care [facility]) - CM/SW Discharge Note   Patient Details  Name: [name] MRN: [id number] Date of Birth: 11-09-1940  Transition of Care [facility]) CM/SW Contact:  Desirey Keahey A [place], LCSWA Phone Number: 02/26/2023, 2:33 PM   Clinical Narrative:     Patient will DC to: [facility]  Anticipated DC date: 02/26/23  Family notified: Natasha Bence  Transport by: Sharin Mons     Per MD patient ready for DC to [facility] for short term rehab. RN, patient, patient's family, and facility notified of DC. Discharge Summary and FL2 sent to facility. RN to call report prior to discharge (Room 108; [phone], ext: [phone]). DC packet on chart. Ambulance transport requested for patient.     CSW will sign off for now as social work intervention is no longer needed. Please consult [place] again if new needs arise.   Final next level of care: Skilled Nursing Facility Barriers to Discharge: Barriers Resolved   Patient Goals and CMS Choice      Discharge Placement                Patient chooses bed at: [facility] Patient to be transferred to facility by: [facility] Name of family member notified: Rana Buhay Patient and family notified of of transfer: 02/26/23  Discharge Plan and Services Additional resources added to the After Visit Summary for                                       Social Determinants of Health (SDOH) Interventions SDOH Screenings   Food Insecurity: No Food Insecurity (02/17/2023)  Housing: Patient Declined (02/17/2023)  Transportation Needs: No Transportation Needs (02/17/2023)  Utilities: Not At Risk (02/17/2023)  Alcohol Screen: Low Risk  (05/30/2021)  Depression (PHQ2-9): Low Risk  (02/08/2023)  Recent Concern: Depression (PHQ2-9) - Medium Risk (01/22/2023)  Financial Resource Strain: Low Risk  (09/05/2022)  Physical Activity: Insufficiently Active (01/22/2023)  Social Connections: Moderately Integrated (09/05/2022)  Stress: Stress Concern  Present (01/22/2023)  Tobacco Use: Medium Risk (02/09/2023)     Readmission Risk Interventions    07/05/2022    9:42 AM  Readmission Risk Prevention Plan  Transportation Screening Complete  Medication Review (RN Care Manager) Complete  PCP or Specialist appointment within 3-5 days of discharge Complete  HRI or Home Care Consult Complete  SW Recovery Care/Counseling Consult Complete  Palliative Care Screening Not Applicable  Skilled Nursing Facility Complete

## 2023-02-26 NOTE — TOC Progression Note (Signed)
Transition of Care [facility]) - Progression Note    Patient Details  Name: [name] MRN: [id number] Date of Birth: Jul 27, 1940  Transition of Care [facility]) CM/SW Contact  Caitlyne Ingham A [place], [place] Phone Number: 02/26/2023, 11:47 AM  Clinical Narrative:    Pt's auth approved with insurance.   Auth ID [id number] Reference ID [id number]  Approval Dates:   02/25/2023-02/28/2023 Pt' medically stable to DC today. CSW to contact [facility] regarding DC.   Expected Discharge Plan: Skilled Nursing Facility Barriers to Discharge: Continued Medical Work up  Expected Discharge Plan and Services       Living arrangements for the past 2 months: Independent Living Facility Expected Discharge Date: 02/26/23                                     Social Determinants of Health (SDOH) Interventions SDOH Screenings   Food Insecurity: No Food Insecurity (02/17/2023)  Housing: Patient Declined (02/17/2023)  Transportation Needs: No Transportation Needs (02/17/2023)  Utilities: Not At Risk (02/17/2023)  Alcohol Screen: Low Risk  (05/30/2021)  Depression (PHQ2-9): Low Risk  (02/08/2023)  Recent Concern: Depression (PHQ2-9) - Medium Risk (01/22/2023)  Financial Resource Strain: Low Risk  (09/05/2022)  Physical Activity: Insufficiently Active (01/22/2023)  Social Connections: Moderately Integrated (09/05/2022)  Stress: Stress Concern Present (01/22/2023)  Tobacco Use: Medium Risk (02/09/2023)    Readmission Risk Interventions    07/05/2022    9:42 AM  Readmission Risk Prevention Plan  Transportation Screening Complete  Medication Review (RN Care Manager) Complete  PCP or Specialist appointment within 3-5 days of discharge Complete  HRI or Home Care Consult Complete  SW Recovery Care/Counseling Consult Complete  Palliative Care Screening Not Applicable  Skilled Nursing Facility Complete

## 2023-02-26 NOTE — Progress Notes (Signed)
Physical Therapy Treatment Patient Details Name: [name] MRN: [id number] DOB: 1940/08/09 Today's Date: 02/26/2023   History of Present Illness 82 y.o. female presents to [facility] hospital on 10/17 with altered mental status with associated hypertensive crisis. AKI on CKD stage IV. MI negative for acute findings, found to have herpes zoster shingles incidentally. 10/22 s/p R knee aspiration and steroid injection by orthopedic PA. PMH includes CKD IV, HTN, RA, hypothyroidism, CABG, COPD, HLD, PE.    PT Comments  Pt received in recliner, pt agreeable to therapy session with encouragement after PTA obtained warmer gown and ace wrappings for BLE for improved hemodynamics, PTA unable to locate compression socks that were used in previous session. Pt needing up to minA for transfers as she fatigued and safety cues throughout for safe UE placement, posture and activity pacing. Pt with c/o moderate to severe pain from external hemorrhoids and would benefit from pressure relief cushion for chair at next setting. Pt continues to benefit from PT services to progress toward functional mobility goals.     If plan is discharge home, recommend the following: A lot of help with walking and/or transfers;A lot of help with bathing/dressing/bathroom;Assistance with cooking/housework;Direct supervision/assist for medications management;Direct supervision/assist for financial management;Assist for transportation;Help with stairs or ramp for entrance   Can travel by private vehicle     Yes  Equipment Recommendations  None recommended by PT (defer to post-acute therapy setting)    Recommendations for Other Services       Precautions / Restrictions Precautions Precautions: Fall Precaution Comments: fall and hit head prior to admission; SBP goal <150 per MD (permissive HTN due to recent orthostatic hypotension) Required Braces or Orthoses:  (BLE ace wraps donned, no compression socks found in her room to put on for  OOB) Restrictions Weight Bearing Restrictions: No     Mobility  Bed Mobility Overal bed mobility: Needs Assistance             General bed mobility comments: pt received up in recliner and agreeable to stay in chair at end of session    Transfers Overall transfer level: Needs assistance Equipment used: Rolling walker (2 wheels) Transfers: Sit to/from Stand, Bed to chair/wheelchair/BSC Sit to Stand: Min assist           General transfer comment: Pt CGA today to stand from chair but needs reminders for safe UE placement. MinA for stand>sit due to poor recall of UE placement and very slow processing to sit, needs minA to prevent sitting prematurely via gait belt. STS x 3 total from chair<>RW    Ambulation/Gait Ambulation/Gait assistance: Contact guard assist Gait Distance (Feet): 90 Feet Assistive device: Rolling walker (2 wheels) Gait Pattern/deviations: Step-through pattern, Drifts right/left       General Gait Details: Cues for posture/forward gaze, mostly CGA with navigational cues and instruction on activity pacing. Pt denies dizziness/lightheadedness so did not check orthostatic BP today, BP WFL once back in recliner. SpO2 91% on RA with exertion, improves to 94-95% on RA in recliner and when using IS.   Stairs             Wheelchair Mobility     Tilt Bed    Modified Rankin (Stroke Patients Only)       Balance Overall balance assessment: Needs assistance Sitting-balance support: No upper extremity supported, Feet supported Sitting balance-Leahy Scale: Good     Standing balance support: Reliant on assistive device for balance, Single extremity supported Standing balance-Leahy Scale: Fair Standing balance  comment: Fair static stand at RW, needs CGA for gait safety with AD. Poor dynamic balance when unsupported.                            Cognition Arousal: Alert Behavior During Therapy: WFL for tasks assessed/performed Overall  Cognitive Status: Within Functional Limits for tasks assessed                                 General Comments: Pt keeps eyes closed while resting but once sitting upright/standing, pt keeps eyes open and follows 1-step commands well.        Exercises General Exercises - Lower Extremity Ankle Circles/Pumps: AROM, Both, 10 reps, Supine Long Arc Quad: AROM, Both, 10 reps, Seated Hip Flexion/Marching: AROM, Both, 10 reps, Seated Other Exercises Other Exercises: IS x 10 reps pt ~1250-1400 mL, encouraged hourly use x10 reps    General Comments General comments (skin integrity, edema, etc.): Pt c/o pain from external hemorrhoids and with some loose stools during session but seemed unaware, PTA assisting with hygiene pt unable to perform her own posterior standing hygiene due to pain/fatigue. Barrier ointment applied after clean-up. Pillow in chair under her hips once back in chair, she would benefit from pressure relief cushion for chair until irritation from hemorrhoids improves.      Pertinent Vitals/Pain Pain Assessment Pain Assessment: Faces Faces Pain Scale: Hurts even more Pain Location: bottom with peri-care Pain Descriptors / Indicators: Grimacing, Discomfort Pain Intervention(s): Limited activity within patient's tolerance, Monitored during session, Repositioned, Other (comment) (pt assisted carefully with hygiene due to some loose stools on chair pad prior to gait trial, barrier ointment applied to area of external hemorrhoids/irritation after peri-care)     PT Goals (current goals can now be found in the care plan section) Acute Rehab PT Goals Patient Stated Goal: none stated by pt, per son for her to get stronger and to move better so she can be more independent PT Goal Formulation: With patient/family Time For Goal Achievement: 03/04/23 Progress towards PT goals: Progressing toward goals    Frequency    Min 1X/week      PT Plan         AM-PAC PT "6  Clicks" Mobility   Outcome Measure  Help needed turning from your back to your side while in a flat bed without using bedrails?: A Little Help needed moving from lying on your back to sitting on the side of a flat bed without using bedrails?: A Lot Help needed moving to and from a bed to a chair (including a wheelchair)?: A Little Help needed standing up from a chair using your arms (e.g., wheelchair or bedside chair)?: A Little Help needed to walk in hospital room?: A Little Help needed climbing 3-5 steps with a railing? : Total (too fatigued to attempt past couple sessions) 6 Click Score: 15    End of Session Equipment Utilized During Treatment: Gait belt Activity Tolerance: Patient tolerated treatment well;Other (comment) (pt c/o hemorrhoid pain) Patient left: in chair;with call bell/phone within reach;with chair alarm set;with family/visitor present Nurse Communication: Mobility status;Other (comment) (rectal pain) PT Visit Diagnosis: History of falling (Z91.81);Muscle weakness (generalized) (M62.81);Difficulty in walking, not elsewhere classified (R26.2);Other symptoms and signs involving the nervous system (R29.898);Pain Pain - part of body:  (bottom/rectal area)     Time: 1130-1200 PT Time Calculation (min) (ACUTE ONLY): 30 min  Charges:    $Gait Training: 8-22 mins $Therapeutic Exercise: 8-22 mins PT General Charges $$ ACUTE PT VISIT: 1 Visit                     Alyus Mofield P., PTA Acute Rehabilitation Services Secure Chat Preferred 9a-5:30pm Office: [phone]    Dorathy Kinsman [facility] 02/26/2023, 12:48 PM

## 2023-02-27 ENCOUNTER — Telehealth: Payer: Self-pay

## 2023-02-27 ENCOUNTER — Non-Acute Institutional Stay (SKILLED_NURSING_FACILITY): Payer: Self-pay | Admitting: Nurse Practitioner

## 2023-02-27 ENCOUNTER — Encounter: Payer: Self-pay | Admitting: Nurse Practitioner

## 2023-02-27 ENCOUNTER — Inpatient Hospital Stay (HOSPITAL_COMMUNITY)
Admission: RE | Admit: 2023-02-27 | Discharge: 2023-02-27 | Disposition: A | Discharge status: Home / Self Care | Payer: Medicare PPO | Source: Ambulatory Visit | Attending: Nephrology | Admitting: Nephrology

## 2023-02-27 DIAGNOSIS — K5901 Slow transit constipation: Secondary | ICD-10-CM

## 2023-02-27 DIAGNOSIS — E871 Hypo-osmolality and hyponatremia: Secondary | ICD-10-CM | POA: Diagnosis not present

## 2023-02-27 DIAGNOSIS — N184 Chronic kidney disease, stage 4 (severe): Secondary | ICD-10-CM | POA: Diagnosis not present

## 2023-02-27 DIAGNOSIS — J432 Centrilobular emphysema: Secondary | ICD-10-CM

## 2023-02-27 DIAGNOSIS — B0229 Other postherpetic nervous system involvement: Secondary | ICD-10-CM | POA: Diagnosis not present

## 2023-02-27 DIAGNOSIS — I10 Essential (primary) hypertension: Secondary | ICD-10-CM | POA: Diagnosis not present

## 2023-02-27 DIAGNOSIS — I2510 Atherosclerotic heart disease of native coronary artery without angina pectoris: Secondary | ICD-10-CM

## 2023-02-27 DIAGNOSIS — M069 Rheumatoid arthritis, unspecified: Secondary | ICD-10-CM | POA: Diagnosis not present

## 2023-02-27 NOTE — Transitions of Care (Post Inpatient/ED Visit) (Signed)
02/27/2023  Name: [name] MRN: [id number] DOB: 04-11-1941  Today's TOC FU Call Status: Today's TOC FU Call Status:: Successful TOC FU Call Completed TOC FU Call Complete Date: 02/27/23  Attempted to reach the patient regarding the most recent Inpatient/ED visit. Patient in rehab Follow Up Plan: No further outreach attempts will be made at this time. We have been unable to contact the patient.  Signature Karena Addison, LPN [facility] Nurse Health Advisor Direct Dial [phone]

## 2023-02-27 NOTE — Assessment & Plan Note (Signed)
f/u nephrology, creat 4.66 02/26/23, peak at 5.1, negative renal US, CBC/diff, CMP/eGFR one week.

## 2023-02-27 NOTE — Assessment & Plan Note (Signed)
Pseudogout/gout R knee, s/p arthrocentesis,  on Allopurinol RA on [place]

## 2023-02-27 NOTE — Assessment & Plan Note (Signed)
Chronic, prn Albuterol, O2

## 2023-02-27 NOTE — Assessment & Plan Note (Signed)
labile, goal Sbp 150s, on Amlodipine 10mg  every day, Coreg bid, Clonidine 0.2mg  patch, off Losartan and Torsemide.

## 2023-02-27 NOTE — Assessment & Plan Note (Signed)
left face, treated with Veltrex. On Tylenol

## 2023-02-27 NOTE — Progress Notes (Signed)
Location:   [facility] Room Number: 108 [id number] Place of Service:  SNF (31) Provider: Chipper Oman NP  Patient Care Team: [name], MD as PCP - General (Internal Medicine) [name], MD as PCP - Cardiology (Cardiology) [name], MD as Consulting Physician (Cardiology) [name], MD as Consulting Physician (Rheumatology) Armbruster, [name], MD as Consulting Physician (Gastroenterology) [name], MD as Consulting Physician (Pulmonary Disease) [name], MD (Inactive) as Consulting Physician (Cardiothoracic Surgery) [name], MD as Consulting Physician (Obstetrics and Gynecology) Plotnikov, Georgina Quint, MD Colletta Maryland, RN as [facility] Care Management Roger Kill, NP as Nurse Practitioner (Nephrology)  Extended Emergency Contact Information Primary Emergency Contact: [name] Address: [address]           [place], [place] [address] [place] Home Phone: [phone] Mobile Phone: [phone] Relation: Spouse Secondary Emergency Contact: [facility] Address: [address]          [place], [place] [address] [place] Mobile Phone: [phone] Relation: Son  Code Status: DNR Goals of Care: Advanced Directive information    02/27/2023    1:27 PM  Advanced Directives  Does Patient Have a Medical Advance Directive? Yes  Type of Advance Directive Out of facility DNR (pink MOST or yellow form)  Does patient want to make changes to medical advance directive? No - Patient declined  Pre-existing out of facility DNR order (yellow form or pink MOST form) Yellow form placed in chart (order not valid for inpatient use)     Chief Complaint  Patient presents with   Hospitalization Follow-up    Hospital Follow up     HPI: Patient is a 82 y.o. female seen in today for follow up hospital stay  Hospitalized 02/15/23-02/26/23 left facial and arm  weakness, underwent neurology evaluation, MRI brain negative. Left facial weakness likely from left facial shingles, resolved.   HTN labile, goal Sbp 150s, on Amlodipine 10mg  every day, Coreg bid, Clonidine 0.2mg  patch, off Losartan and Torsemide.   CKD, f/u nephrology, creat 4.66 02/26/23, peak at 5.1, negative renal US  Post herpetic neurologia left face, treated with Veltrex. On Tylenol  Pseudogout/gout R knee, s/p arthrocentesis,  on Allopurinol Hyponatremia, Na 120 02/26/23  Constipation, prn Bisacodyl, Colace  CAD, s/p CABG  Hx of DVT/PE failed Eliquis 2/2 GI bleed, on Plavix presently.   COPD, prn Albuterol,   RA, on Arava  Hypothyroidism, on Levothyroxine, TSH 2.829 02/15/23  GERD, taking Pantoprazole. Hgb 10.3 02/26/23  HLD, on Atorvastatin       02/08/2023    2:33 PM 01/22/2023    1:59 PM 01/15/2023   10:07 AM 12/12/2022    4:42 PM 10/30/2022    9:19 AM  Depression screen PHQ 2/9  Decreased Interest 0 1 1 1 1   Down, Depressed, Hopeless 0 1 0 1 1  PHQ - 2 Score 0 2 1 2 2   Altered sleeping 0 1  1 1   Tired, decreased energy 0 1  1 1   Change in appetite 0 1  1 1   Feeling bad or failure about yourself  0 1  1 1   Trouble concentrating 0 0  1 1  Moving slowly or fidgety/restless 0 1  1 1   Suicidal thoughts 0 0  0 0  PHQ-9 Score 0 7  8 8   Difficult doing work/chores Not difficult at all Somewhat difficult  Somewhat difficult Somewhat difficult  06/13/2022   10:42 PM 08/07/2022   11:33 AM 09/05/2022   11:02 AM 01/26/2023    1:48 PM 02/08/2023    2:33 PM  Fall Risk  Falls in the past year? 1 1 1  0 1  Was there an injury with Fall? 0 1 1 0 1  Fall Risk Category Calculator 1 3 2  0 3  Patient at Risk for Falls Due to  History of fall(s) Impaired balance/gait No Fall Risks History of fall(s)  Patient at Risk for Falls Due to - Comments    use a walker   Fall risk Follow up  Falls evaluation completed Falls evaluation completed Falls evaluation completed Falls evaluation  completed      12/21/2016    9:30 AM 11/26/2014    9:22 AM  MMSE - Mini Mental State Exam  Not completed:  Unable to complete  Orientation to time 5   Orientation to Place 5   Registration 3   Attention/ Calculation 5   Recall 2   Language- name 2 objects 2   Language- repeat 1   Language- follow 3 step command 3   Language- read & follow direction 1   Write a sentence 1   Copy design 1   Total score 29      Health Maintenance  Topic Date Due   Fecal DNA (Cologuard)  Never done   COVID-19 Vaccine (6 - 2023-24 season) 12/31/2022   Medicare Annual Wellness (AWV)  06/16/2023   DTaP/Tdap/Td (3 - Td or Tdap) 06/01/2026   Pneumonia Vaccine 70+ Years old  Completed   INFLUENZA VACCINE  Completed   DEXA SCAN  Completed   Zoster Vaccines- Shingrix  Completed   HPV VACCINES  Aged Out   Colonoscopy  Discontinued    Urinary incontinence? Functional Status Survey:   Exercise? Diet? No results found. 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 Most likely scar. Mild subendocardial ischemia. EF 71% LOW RISK.    Cervical disc disease    fracture   Chronic kidney disease    CKD4   COPD (chronic obstructive pulmonary disease) (HCC)    Diverticulosis    DVT (deep venous thrombosis) (HCC)    RLE; Sept '23    History of DVT (deep vein thrombosis) 03/06/2022   History of pulmonary embolus (PE)    Oct '23   Hyperlipidemia    Hypertension    Hypothyroidism (acquired)    hypo   Myocardial infarction (HCC) 11/2014   TIA   Polymyalgia rheumatica (HCC)    2011 Dr. Kellie Simmering   Pulmonary emboli [facility]) 02/06/2022   01/2022 PE/DVT post-op. Lower GI bleed     Treat edema  Start Eliquis at the reduced dose due to  Creat>1.5 and age >27   Rheumatoid arthritis (HCC) 2011   Dr.Truslow; fracture knees, hands and wrists -    S/P CABG x 1 1996   CABG--LIMA-LAD for ostial LAD (not felt to be PCI amenable). EF NORMAL then; LIMA now atretic   Shortness of breath dyspnea    with exertion   Stroke (HCC) 11/2014   TIA    Urinary frequency     Past Surgical History:  Procedure Laterality Date   ABDOMINAL AORTIC ANEURYSM REPAIR  1996   Complicated by mesenteric artery stenosis and splenic artery infarction with acquired Asplenia   APPENDECTOMY     BIOPSY  07/08/2022   Procedure: BIOPSY;  Surgeon: Benancio Deeds, MD;  Location[facility] ENDOSCOPY;  Service: Gastroenterology;;   BIOPSY  11/28/2022   Procedure: BIOPSY;  Surgeon: [name], MD;  Location: [facility] ENDOSCOPY;  Service: Gastroenterology;;   [name]  07/2011   right foot   CARDIAC CATHETERIZATION  2005   (Most recent CATH) - ostial LAD lesion 20-30% (down from 90% initially). Atretic LIMA. Minimal disease the RCA and Circumflex system.   CARPAL TUNNEL RELEASE Left    CATARACT EXTRACTION Bilateral    CERVICAL SPINE SURGERY     plate 1610 Dr. Wynetta Emery   COLONOSCOPY     COLONOSCOPY WITH PROPOFOL N/A 11/28/2022   Procedure: COLONOSCOPY WITH PROPOFOL;  Surgeon: [name], MD;  Location: [facility] ENDOSCOPY;  Service: Gastroenterology;  Laterality: N/A;   CORONARY ARTERY BYPASS GRAFT  1996   INCLUDED AN INTERNAL MAMMARY ARTERY TO THE LAD. EF WAS NORMAL   ESOPHAGOGASTRODUODENOSCOPY (EGD) WITH PROPOFOL N/A 07/08/2022   Procedure: ESOPHAGOGASTRODUODENOSCOPY  (EGD) WITH PROPOFOL;  Surgeon: Benancio Deeds, MD;  Location: [facility] ENDOSCOPY;  Service: Gastroenterology;  Laterality: N/A;   ESOPHAGOGASTRODUODENOSCOPY (EGD) WITH PROPOFOL N/A 11/28/2022   Procedure: ESOPHAGOGASTRODUODENOSCOPY (EGD) WITH PROPOFOL;  Surgeon: [name], MD;  Location: [facility] ENDOSCOPY;  Service: Gastroenterology;  Laterality: N/A;   INGUINAL HERNIA REPAIR Right    LAPAROSCOPIC APPENDECTOMY N/A 06/28/2016   Procedure: APPENDECTOMY LAPAROSCOPIC;  Surgeon: [name], MD;  Location: [facility] ORS;  Service: General;  Laterality: N/A;   NM MYOVIEW LTD  06/2010   Fixed anteroseptal, apical and inferoapical defect with moderate size. Most likely scar. Mild subendocardial ischemia. EF 71% LOW RISK.    POLYPECTOMY  11/28/2022   Procedure: POLYPECTOMY;  Surgeon: [name], MD;  Location: [facility] ENDOSCOPY;  Service: Gastroenterology;;   SPLENECTOMY     TOTAL HIP ARTHROPLASTY Left 01/23/2022   Procedure: LEFT TOTAL HIP ARTHROPLASTY ANTERIOR APPROACH;  Surgeon: [name], MD;  Location: [facility] OR;  Service: Orthopedics;  Laterality: Left;  3-C   TRANSTHORACIC ECHOCARDIOGRAM  12/2014   [facility]: Normal LV size & function. EF 55-60%,    vagina polyp     VIDEO ASSISTED THORACOSCOPY (VATS)/WEDGE RESECTION Left 03/02/2015   Procedure: VIDEO ASSISTED THORACOSCOPY (VATS), MINI THORACOTOMY, LEFT UPPER LOBE WEDGE, TAKE DOWN OF INTERNAL MAMMARY LESIONS, PLACEMENT OF ON-Q PUMP;  Surgeon: [name], MD;  Location: [facility] OR;  Service: Thoracic;  Laterality: Left;   VIDEO BRONCHOSCOPY N/A 03/02/2015   Procedure: BRONCHOSCOPY;  Surgeon: [name], MD;  Location: [facility] OR;  Service: Thoracic;  Laterality: N/A;   VIDEO BRONCHOSCOPY WITH ENDOBRONCHIAL NAVIGATION N/A 10/08/2017   Procedure: VIDEO BRONCHOSCOPY WITH ENDOBRONCHIAL NAVIGATION WITH BIOPSIES OF LEFT UPPER LOBE AND LEFT LOWER LOBE;  Surgeon: [name], MD;  Location: [facility] OR;  Service: Thoracic;  Laterality: N/A;  The  patient has a family history of  shoulder Social History   Socioeconomic History   Marital status: Married    Spouse name: Not on file   Number of children: 4   Years of education: Not on file   Highest education level: Bachelor's degree (e.g., BA, AB, BS)  Occupational History   Occupation: retired    Associate Professor: RETIRED  Tobacco Use   Smoking status: Former    Current packs/day: 0.00    Types: Cigarettes    Quit date: 12/04/1958    Years since quitting: 64.2    Passive exposure: Never   Smokeless tobacco: Never  Vaping Use   Vaping status: Never Used  Substance and Sexual Activity   Alcohol use: Yes    Comment: hardly ever   Drug use: No   Sexual activity: Not Currently  Other Topics Concern   Not on file  Social History Narrative   [facility]  MARRIED -RETIRED ; 4 CHILDREN,2 CHILDREN.    [facility] since [facility]   Social Determinants of Health   Financial Resource Strain: Low Risk  (09/05/2022)   Overall Financial Resource Strain (CARDIA)    Difficulty of Paying Living Expenses: Not hard at all  Food Insecurity: No Food Insecurity (02/17/2023)   Hunger Vital Sign    Worried About Running Out of Food in the Last Year: Never true    Ran Out of Food in the Last Year: Never true  Transportation Needs: No Transportation Needs (02/17/2023)   PRAPARE - Administrator, Civil Service (Medical): No    Lack of Transportation (Non-Medical): No  Physical Activity: Insufficiently Active (01/22/2023)   Exercise Vital Sign    Days of Exercise per Week: 2 days    Minutes of Exercise per Session: 10 min  Stress: Stress Concern Present (01/22/2023)   [employer] of Occupational Health - Occupational Stress Questionnaire    Feeling of Stress : To some extent  Social Connections: Moderately Integrated (09/05/2022)   Social Connection and Isolation Panel [NHANES]    Frequency of Communication with Friends and Family: More than three  times a week    Frequency of Social Gatherings with Friends and Family: Patient declined    Attends Religious Services: Never    Database administrator or Organizations: Yes    Attends Banker Meetings: Patient declined    Marital Status: Married  Catering manager Violence: Not At Risk (02/17/2023)   Humiliation, Afraid, Rape, and Kick questionnaire    Fear of Current or Ex-Partner: No    Emotionally Abused: No    Physically Abused: No    Sexually Abused: No    Allergies  Allergen Reactions   Nsaids Other (See Comments)    Stomach upset Told to avoid due to Eliquis   Aspirin Other (See Comments)    Stomach upset Told to avoid due to Eliquis   Codeine Nausea And Vomiting   Eliquis [Apixaban] Other (See Comments)    Gi upset    Nitrostat [Nitroglycerin] Other (See Comments)    Bradycardia. Drop in heart rate    Tramadol Other (See Comments)    Confusion    Allergies as of 02/27/2023       Reactions   Nsaids Other (See Comments)   Stomach upset Told to avoid due to Eliquis   Aspirin Other (See Comments)   Stomach upset Told to avoid due to Eliquis   Codeine Nausea  And Vomiting   Eliquis [apixaban] Other (See Comments)   Gi upset    Nitrostat [nitroglycerin] Other (See Comments)   Bradycardia. Drop in heart rate    Tramadol Other (See Comments)   Confusion        Medication List        Accurate as of February 27, 2023  3:55 PM. If you have any questions, ask your nurse or doctor.          STOP taking these medications    valACYclovir 1000 MG tablet Commonly known as: Valtrex Stopped by: Daphene Chisholm X Jesicca Dipierro       TAKE these medications    acetaminophen 325 MG tablet Commonly known as: TYLENOL Take 2 tablets (650 mg total) by mouth 2 (two) times daily as needed (generalized pain).   albuterol 108 (90 Base) MCG/ACT inhaler Commonly known as: VENTOLIN HFA Inhale 1-2 puffs into the lungs every 4 (four) hours as needed for wheezing or shortness  of breath. What changed: when to take this   allopurinol 100 MG tablet Commonly known as: ZYLOPRIM Take 1 tablet (100 mg total) by mouth daily.   amLODipine 10 MG tablet Commonly known as: NORVASC Take 1 tablet (10 mg total) by mouth daily.   ascorbic acid 500 MG tablet Commonly known as: VITAMIN C Take 1 tablet (500 mg total) by mouth daily.   atorvastatin 80 MG tablet Commonly known as: LIPITOR Take 1 tablet (80 mg total) by mouth daily.   B-complex with vitamin C tablet Take 1 tablet by mouth daily.   bisacodyl 5 MG EC tablet Commonly known as: DULCOLAX Take 2 tablets (10 mg total) by mouth daily as needed for moderate constipation or severe constipation.   carvedilol 6.25 MG tablet Commonly known as: COREG Take 1 tablet (6.25 mg total) by mouth 2 (two) times daily with a meal.   CENTRUM ADULT PO Take 1 tablet by mouth daily.   cloNIDine 0.2 mg/24hr patch Commonly known as: CATAPRES - Dosed in mg/24 hr Place 1 patch (0.2 mg total) onto the skin once a week.   clopidogrel 75 MG tablet Commonly known as: PLAVIX Take 1 tablet (75 mg total) by mouth every other day.   Cyanocobalamin 2500 MCG Tabs Take 2,500 mcg by mouth daily.   docusate sodium 100 MG capsule Commonly known as: COLACE Take 1 capsule (100 mg total) by mouth 2 (two) times daily as needed for mild constipation.   hydrOXYzine 25 MG tablet Commonly known as: ATARAX Take 1 tablet (25 mg total) by mouth 3 (three) times daily as needed for itching.   leflunomide 20 MG tablet Commonly known as: ARAVA Take 1 tablet (20 mg total) by mouth daily.   levothyroxine 88 MCG tablet Commonly known as: SYNTHROID Take 1 tablet (88 mcg total) by mouth daily before breakfast.   melatonin 3 MG Tabs tablet Take 1 tablet (3 mg total) by mouth at bedtime as needed.   pantoprazole 40 MG tablet Commonly known as: PROTONIX Take 1 tablet (40 mg total) by mouth daily. What changed: when to take this   sertraline 50  MG tablet Commonly known as: ZOLOFT Take 0.5 tablets (25 mg total) by mouth daily.   triamcinolone 55 MCG/ACT Aero nasal inhaler Commonly known as: NASACORT Place 1 spray into the nose daily. What changed:  when to take this reasons to take this   Vitamin D3 50 MCG (2000 UT) capsule Take 1 capsule (2,000 Units total) by mouth daily.  Review of Systems:  Review of Systems  Constitutional:  Positive for fatigue. Negative for appetite change and fever.  HENT:  Negative for congestion and trouble swallowing.   Eyes:  Negative for visual disturbance.  Respiratory:  Positive for cough and shortness of breath. Negative for wheezing.        DOE  Cardiovascular:  Positive for leg swelling.  Gastrointestinal:  Negative for abdominal pain, blood in stool, constipation, nausea and vomiting.  Genitourinary:  Negative for difficulty urinating, dysuria and urgency.  Musculoskeletal:  Positive for arthralgias, back pain and gait problem. Negative for joint swelling.  Skin:  Negative for color change.  Neurological:  Negative for tremors, weakness and headaches.       Left facial post herpetic neuralgia.   Psychiatric/Behavioral:  Negative for behavioral problems and sleep disturbance. The patient is not nervous/anxious.     Physical Exam: Vitals:   02/27/23 1322  BP: (!) 159/67  Pulse: 84  Resp: 18  Temp: 98.1 F (36.7 C)  SpO2: 94%  Weight: 122 lb (55.3 kg)  Height: 5\' 3"  (1.6 m)   Body mass index is 21.61 kg/m. Physical Exam Constitutional:      Appearance: Normal appearance.     Comments: Exhausted easily.   HENT:     Head: Normocephalic and atraumatic.     Nose:     Comments: Facial bruises, discomfort bridge of nose palpated.     Mouth/Throat:     Mouth: Mucous membranes are moist.  Eyes:     Extraocular Movements: Extraocular movements intact.     Conjunctiva/sclera: Conjunctivae normal.     Pupils: Pupils are equal, round, and reactive to light.   Cardiovascular:     Rate and Rhythm: Normal rate and regular rhythm.     Heart sounds: No murmur heard. Pulmonary:     Effort: Pulmonary effort is normal.     Breath sounds: No rales.  Abdominal:     General: Bowel sounds are normal.     Palpations: Abdomen is soft.     Tenderness: There is no abdominal tenderness.  Genitourinary:    Comments: External hemorrhoids from previous examination.  Musculoskeletal:     Cervical back: Normal range of motion and neck supple.     Right lower leg: Edema present.     Left lower leg: Edema present.     Comments: Trace edema BLE L>R  Skin:    General: Skin is warm and dry.     Comments: L hip surgical scar  Neurological:     General: No focal deficit present.     Mental Status: She is alert and oriented to person, place, and time. Mental status is at baseline.     Motor: No weakness.     Gait: Gait abnormal.     Comments: Left facial post herpetic neuralgia.   Psychiatric:        Mood and Affect: Mood normal.        Behavior: Behavior normal.        Thought Content: Thought content normal.     Labs reviewed: Basic Metabolic Panel: Recent Labs    07/24/22 1009 07/25/22 0533 08/01/22 0800 08/02/22 0918 09/05/22 1155 09/30/22 1849 02/15/23 0645 02/16/23 0356 02/18/23 0413 02/24/23 0435 02/25/23 0448 02/26/23 0416  NA 138 139 141   < > 138   < > 131* 136   < > 128* 127* 129*  K 4.3 4.0 4.2   < > 4.4   < > 3.9  3.8   < > 4.9 4.6 4.3  CL 103 104 105   < > 94*   < > 94* 98   < > 95* 97* 96*  CO2 21* 21* 25*   < > 32   < > 19* 19*   < > 19* 18* 19*  GLUCOSE 132* 91  --    < > 74   < > 163* 110*   < > 103* 94 81  BUN 65* 62* 57*   < > 60*   < > 90* 77*   < > 106* 99* 100*  CREATININE 3.84* 3.71* 3.3*   < > 3.46*   < > 4.79* 4.28*   < > 4.99* 4.57* 4.66*  CALCIUM 9.3 9.6 10.0   < > 10.2   < > 9.8 9.7   < > 9.9 9.7 10.2  MG  --  1.9  --   --   --   --   --  2.2   < > 2.2 2.2 2.2  PHOS 4.9* 5.4*  --   --   --   --   --  5.7*  --   --    --   --   TSH  --   --  0.65  --  2.94  --  2.829  --   --   --   --   --    < > = values in this interval not displayed.   Liver Function Tests: Recent Labs    10/04/22 1300 12/07/22 2140 02/15/23 0645  AST 25 29 24   ALT 18 17 17   ALKPHOS 73 85 81  BILITOT 0.5 1.2 0.9  PROT 5.8* 6.1* 7.0  ALBUMIN 3.0* 3.2* 3.6   No results for input(s): "LIPASE", "AMYLASE" in the last 8760 hours. No results for input(s): "AMMONIA" in the last 8760 hours. CBC: Recent Labs    09/30/22 1849 10/11/22 1325 11/27/22 0105 11/27/22 0506 02/15/23 0551 02/15/23 0635 02/24/23 0435 02/25/23 0448 02/26/23 0416  WBC 9.8  --  8.5   < > 9.4   < > 9.2 10.9* 10.7*  NEUTROABS 6.3  --  4.4  --  7.9*  --   --   --   --   HGB 10.4*   < > 10.0*   < > 12.8   < > 11.3* 10.6* 10.3*  HCT 32.9*  --  32.0*   < > 39.6   < > 33.5* 31.5* 31.5*  MCV 97.9  --  101.3*   < > 100.8*   < > 97.4 95.7 96.6  PLT 343  --  227   < > 329   < > 363 361 366   < > = values in this interval not displayed.   Lipid Panel: Recent Labs    02/16/23 0356  TRIG 163*   No results found for: "HGBA1C"  Procedures: CT ABDOMEN PELVIS WO CONTRAST  Result Date: 02/24/2023 CLINICAL DATA:  Abdominal pain, acute, nonlocalized. Stomach distention. EXAM: CT ABDOMEN AND PELVIS WITHOUT CONTRAST TECHNIQUE: Multidetector CT imaging of the abdomen and pelvis was performed following the standard protocol without IV contrast. RADIATION DOSE REDUCTION: This exam was performed according to the departmental dose-optimization program which includes automated exposure control, adjustment of the mA and/or kV according to patient size and/or use of iterative reconstruction technique. COMPARISON:  01/22/2012. FINDINGS: Lower chest: The heart is enlarged. There is a trace left pleural effusion. Atelectasis or scarring is noted at the  lung bases bilaterally. Hepatobiliary: The left lobe of the liver has a nodular contour, suggesting underlying cirrhosis. No  focal abnormality is seen in the liver. No biliary ductal dilatation. Stones are present within the gallbladder. Pancreas: Unremarkable. No pancreatic ductal dilatation or surrounding inflammatory changes. Spleen: Surgically absent. Adrenals/Urinary Tract: Adrenal glands are within normal limits. Cysts are present in the kidneys bilaterally. No renal calculus or hydronephrosis bilaterally. The visualized portion of the urinary bladder is within normal limits. Stomach/Bowel: There is a small hiatal hernia. Stomach is within normal limits. Appendix is not seen. There is diffuse rectal wall thickening with surrounding fat stranding. Scattered diverticula are present along the colon without evidence of diverticulitis. No free air or pneumatosis. Vascular/Lymphatic: Aortic atherosclerosis. No enlarged abdominal or pelvic lymph nodes. Reproductive: Uterus and bilateral adnexa are unremarkable. Other: No abdominopelvic ascites. Musculoskeletal: Degenerative changes are present in the thoracolumbar spine. Total hip arthroplasty changes are present on the left. No acute osseous abnormality. IMPRESSION: 1. Marked rectal wall thickening with surrounding fat stranding suggesting proctitis. 2. Trace left pleural effusion with atelectasis at the lung bases. 3. Small hiatal hernia. 4. Diverticulosis without diverticulitis. 5. Nodular contour of the left lobe of the liver suggesting cirrhosis. 6. Cholelithiasis. 7. Aortic atherosclerosis Electronically Signed   By: Thornell Sartorius M.D.   On: 02/24/2023 21:53   DG Abd 1 View  Result Date: 02/23/2023 CLINICAL DATA:  Acute abdominal pain EXAM: ABDOMEN - 1 VIEW COMPARISON:  Abdominal x-ray 09/21/2022 FINDINGS: There is gaseous distention of the stomach. Air seen throughout nondilated colon and small bowel to the level the rectum. There are no suspicious calcifications. There is dextroconvex curvature of the lumbar spine with degenerative change. Left hip arthroplasty is present.  IMPRESSION: Gaseous distention of the stomach. Nonobstructive bowel gas pattern. Electronically Signed   By: Darliss Cheney M.D.   On: 02/23/2023 18:12   CT KNEE RIGHT WO CONTRAST  Result Date: 02/19/2023 CLINICAL DATA:  Knee pain EXAM: CT OF THE RIGHT KNEE WITHOUT CONTRAST TECHNIQUE: Multidetector CT imaging of the right knee was performed according to the standard protocol. Multiplanar CT image reconstructions were also generated. RADIATION DOSE REDUCTION: This exam was performed according to the departmental dose-optimization program which includes automated exposure control, adjustment of the mA and/or kV according to patient size and/or use of iterative reconstruction technique. COMPARISON:  02/18/2023 radiographs FINDINGS: Bones/Joint/Cartilage Meniscal chondrocalcinosis. Large knee joint effusion. Cortical thickening posterolaterally in the lateral tibial plateau and posterolaterally along the lateral femoral condyle, possibly degenerative although subcortical stress fractures are not excluded. There is some widening of the lateral compartment posteriorly raise the possibility of a prior compression of the lateral tibial plateau but no well-defined acute cortical fracture is identified. Tricompartmental marginal spurring noted. Ligaments Suboptimally assessed by CT. Muscles and Tendons Unremarkable Soft tissues Moderate-sized Baker's cyst. Distal SFA and popliteal artery atheromatous vascular calcification. Subcutaneous edema anterior to the junction of the quadriceps tendon and lateral patellar retinaculum. IMPRESSION: 1. Large knee joint effusion. 2. Cortical thickening posterolaterally in the lateral tibial plateau and posterolaterally along the lateral femoral condyle, possibly degenerative although subcortical stress fractures are not excluded. There is some widening of the lateral compartment posteriorly raise the possibility of a prior compression of the lateral tibial plateau but no well-defined  acute cortical fracture is identified. 3. 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 4. Moderate-sized Baker's cyst. 5. Subcutaneous edema anterior to the junction of the quadriceps tendon and lateral patellar retinaculum. 6. Distal SFA and popliteal artery  atheromatous vascular calcification. Electronically Signed   By: Gaylyn Rong M.D.   On: 02/19/2023 13:33   DG Knee 1-2 Views Right  Result Date: 02/18/2023 CLINICAL DATA:  Right knee pain and swelling following fall, initial encounter EXAM: RIGHT KNEE - 2 VIEW COMPARISON:  08/02/2022 FINDINGS: Tricompartmental degenerative changes are noted. There is suggestion of mildly displaced fracture through the intercondylar eminence of the proximal tibia best seen on the lateral projection. Cross-sectional imaging may be helpful for further evaluation. Large suprapatellar joint effusion is seen. IMPRESSION: Large joint effusion with findings suspicious for proximal tibial fracture. CT may be helpful when clinically able. Electronically Signed   By: Alcide Clever M.D.   On: 02/18/2023 22:12   US RENAL  Result Date: 02/18/2023 CLINICAL DATA:  Acute kidney injury. EXAM: RENAL / URINARY TRACT ULTRASOUND COMPLETE COMPARISON:  CT 12/08/2022, renal ultrasound 07/11/2022 FINDINGS: Right Kidney: Renal measurements: 8.1 x 4 x 3.3 cm = volume: 55 mL. Renal parenchymal atrophy and increased echogenicity. Simple cyst measures 3.6 cm. Additional smaller cysts. No hydronephrosis. Left Kidney: Renal measurements: 7.2 x 3.3 x 3.6 cm = volume: 44 mL. Renal parenchymal atrophy and increased echogenicity. There are multiple simple cysts, which were better appreciated on prior CT. No hydronephrosis. Bladder: Only minimally distended. Appears normal for degree of bladder distention. Other: None. IMPRESSION: 1. No obstructive uropathy. 2. Renal parenchymal atrophy and findings of chronic medical renal disease. Electronically Signed   By: Narda Rutherford  M.D.   On: 02/18/2023 14:02   EEG adult  Result Date: 02/15/2023 Charlsie Quest, MD     02/15/2023  4:07 PM Patient Name: [name] MRN: [id number] Epilepsy Attending: Charlsie Quest Referring Physician/Provider: Lynnell Catalan, MD Date: 02/15/2023 Duration: 22.26 mins Patient history: 82 yo F with acutely altered mental status with associated hypertensive crisis, but without visible seizure activity and only left facial droop as a consistent focal deficit. EEG to evaluate for seizure Level of alertness: Awake AEDs during EEG study: None Technical aspects: This EEG study was done with scalp electrodes positioned according to the 10-20 International system of electrode placement. Electrical activity was reviewed with band pass filter of 1-70Hz , sensitivity of 7 uV/mm, display speed of 24mm/sec with a 60Hz  notched filter applied as appropriate. EEG data were recorded continuously and digitally stored.  Video monitoring was available and reviewed as appropriate. Description: EEG showed continuous generalized predominantly 5 to 6 Hz theta slowing admixed with intermittent 2-3hz  delta slowing. Hyperventilation and photic stimulation were not performed.   ABNORMALITY - Continuous slow, generalized IMPRESSION: This study is suggestive of mild to moderate diffuse encephalopathy. No seizures or epileptiform discharges were seen throughout the recording. Charlsie Quest   MR BRAIN WO CONTRAST  Result Date: 02/15/2023 CLINICAL DATA:  Mental status change, unknown cause. EXAM: MRI HEAD WITHOUT CONTRAST TECHNIQUE: Multiplanar, multiecho pulse sequences of the brain and surrounding structures were obtained without intravenous contrast. COMPARISON:  Head CT February 15, 2023. FINDINGS: Brain: No acute infarction, hemorrhage, extra-axial collection or mass lesion. Moderate supratentorial and prominence of the cerebral sulci related to parenchymal volume loss. Minimal chronic white matter. Vascular: Normal flow  voids.  Hypoplastic vertebral. Skull and upper cervical spine: Postsurgical changes in the visualized upper cervical spine. No focal identified. Sinuses/Orbits: Mild mucosal thickening of the bilateral mastoids and ethmoid cells. Bilateral lens surgery. Other: None. IMPRESSION: 1. No acute intracranial abnormality. 2. Moderate parenchymal volume loss. Electronically Signed   By: Blenda Nicely.D.  On: 02/15/2023 15:22   DG Chest Port 1 View  Result Date: 02/15/2023 CLINICAL DATA:  82 year old female unresponsive patient. EXAM: PORTABLE CHEST 1 VIEW COMPARISON:  Chest x-ray 12/10/2022. FINDINGS: Lung volumes are low. No consolidative airspace disease. No pleural effusions. No pneumothorax. No evidence of pulmonary edema. Heart size is normal. Upper mediastinal contours are within normal limits. Status post median sternotomy for CABG. Atherosclerotic calcifications are noted in the thoracic aorta. IMPRESSION: 1. Low lung volumes without radiographic evidence of acute cardiopulmonary disease. 2. Aortic atherosclerosis. Electronically Signed   By: Trudie Reed M.D.   On: 02/15/2023 07:33   CT HEAD WO CONTRAST  Result Date: 02/15/2023 CLINICAL DATA:  Lethargy.  Left-sided facial droop and weakness. EXAM: CT HEAD WITHOUT CONTRAST CT CERVICAL SPINE WITHOUT CONTRAST TECHNIQUE: Multidetector CT imaging of the head and cervical spine was performed following the standard protocol without intravenous contrast. Multiplanar CT image reconstructions of the cervical spine were also generated. RADIATION DOSE REDUCTION: This exam was performed according to the departmental dose-optimization program which includes automated exposure control, adjustment of the mA and/or kV according to patient size and/or use of iterative reconstruction technique. COMPARISON:  08/02/2022 FINDINGS: CT HEAD FINDINGS Brain: No evidence of acute infarction, hemorrhage, hydrocephalus, extra-axial collection or mass  lesion/mass effect. Generalized brain atrophy in keeping with age Vascular: No hyperdense vessel or unexpected calcification. Skull: Normal. Negative for fracture or focal lesion. Sinuses/Orbits: No acute finding. CT CERVICAL SPINE FINDINGS Alignment: Normal. Skull base and vertebrae: No acute fracture. No primary bone lesion or focal pathologic process. C3-C5 ACDF with solid arthrodesis Soft tissues and spinal canal: No prevertebral fluid or swelling. No visible canal hematoma. Disc levels: Disc collapse and endplate spurring especially at C5-6 to C6-7. 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 Upper chest: No visible injury IMPRESSION: No acute finding in the head or cervical spine. No specific cause for symptoms. Electronically Signed   By: Tiburcio Pea M.D.   On: 02/15/2023 06:57   CT CERVICAL SPINE WO CONTRAST  Result Date: 02/15/2023 CLINICAL DATA:  Lethargy.  Left-sided facial droop and weakness. EXAM: CT HEAD WITHOUT CONTRAST CT CERVICAL SPINE WITHOUT CONTRAST TECHNIQUE: Multidetector CT imaging of the head and cervical spine was performed following the standard protocol without intravenous contrast. Multiplanar CT image reconstructions of the cervical spine were also generated. RADIATION DOSE REDUCTION: This exam was performed according to the departmental dose-optimization program which includes automated exposure control, adjustment of the mA and/or kV according to patient size and/or use of iterative reconstruction technique. COMPARISON:  08/02/2022 FINDINGS: CT HEAD FINDINGS Brain: No evidence of acute infarction, hemorrhage, hydrocephalus, extra-axial collection or mass lesion/mass effect. Generalized brain atrophy in keeping with age Vascular: No hyperdense vessel or unexpected calcification. Skull: Normal. Negative for fracture or focal lesion. Sinuses/Orbits: No acute finding. CT CERVICAL SPINE FINDINGS Alignment: Normal. Skull base and vertebrae: No acute fracture. No primary bone  lesion or focal pathologic process. C3-C5 ACDF with solid arthrodesis Soft tissues and spinal canal: No prevertebral fluid or swelling. No visible canal hematoma. Disc levels: Disc collapse and endplate spurring especially at C5-6 to C6-7. 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 Upper chest: No visible injury IMPRESSION: No acute finding in the head or cervical spine. No specific cause for symptoms. Electronically Signed   By: Tiburcio Pea M.D.   On: 02/15/2023 06:57    Assessment/Plan  Post herpetic neuralgia left face, treated with Veltrex. On Tylenol  Essential hypertension labile, goal Sbp 150s, on  Amlodipine 10mg  every day, Coreg bid, Clonidine 0.2mg  patch, off Losartan and Torsemide.  COPD (chronic obstructive pulmonary disease) (HCC) Chronic, prn Albuterol, O2  CKD (chronic kidney disease) stage 4, GFR 15-29 ml/min (HCC) - baseline SCr 2.5. follows with Dr. Malen Gauze with [facility]  f/u nephrology, creat 4.66 02/26/23, peak at 5.1, negative renal US, CBC/diff, CMP/eGFR one week.   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   Slow transit constipation prn Bisacodyl, Colace  Hyponatremia Na 120 02/26/23   Labs/tests ordered:  CBC/diff, CMP/eGFR one week per DC summary  Plan of care reviewed with the patient, the patient's husband and son, and charge nurse  Time spend 35 minutes.

## 2023-02-27 NOTE — Assessment & Plan Note (Signed)
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

## 2023-02-27 NOTE — Assessment & Plan Note (Signed)
Na 120 02/26/23

## 2023-02-27 NOTE — Assessment & Plan Note (Signed)
prn Bisacodyl, Colace

## 2023-02-28 ENCOUNTER — Encounter: Payer: Self-pay | Admitting: Internal Medicine

## 2023-02-28 NOTE — Assessment & Plan Note (Signed)
BP Readings from Last 3 Encounters:  02/27/23 (!) 159/67  02/26/23 (!) 137/57  02/13/23 (!) 177/100  Elevated blood pressure.  Treat pain  Renal artery [place] - OK 09/2022 HTN: start Clonidine patch + Clonidine po prn

## 2023-02-28 NOTE — Assessment & Plan Note (Signed)
Meds may need adjustment in the future

## 2023-03-01 ENCOUNTER — Non-Acute Institutional Stay (SKILLED_NURSING_FACILITY): Payer: Medicare PPO | Admitting: Sports Medicine

## 2023-03-01 ENCOUNTER — Encounter: Payer: Self-pay | Admitting: Sports Medicine

## 2023-03-01 DIAGNOSIS — Z66 Do not resuscitate: Secondary | ICD-10-CM

## 2023-03-01 NOTE — Progress Notes (Signed)
Provider:  Willey Blade, MD Location:  [facility] Room Number: N063-A Place of Service:  SNF (31)  PCP: Plotnikov, Georgina Quint, MD Patient Care Team: [name], MD as PCP - General (Internal Medicine) [name], MD as PCP - Cardiology (Cardiology) [name], MD as Consulting Physician (Cardiology) [name], MD as Consulting Physician (Rheumatology) Armbruster, [name], MD as Consulting Physician (Gastroenterology) [name], MD as Consulting Physician (Pulmonary Disease) [name], MD (Inactive) as Consulting Physician (Cardiothoracic Surgery) [name], MD as Consulting Physician (Obstetrics and Gynecology) Plotnikov, Georgina Quint, MD Colletta Maryland, RN as [facility] Care Management Roger Kill, NP as Nurse Practitioner (Nephrology)  Extended Emergency Contact Information Primary Emergency Contact: [name] Address: [address]           [place], [place] [address] [place] Home Phone: [phone] Mobile Phone: [phone] Relation: Spouse Secondary Emergency Contact: [facility] Address: [address]          [place], [place] [address] [place] Mobile Phone: [phone] Relation: Son  Code Status: DNR Goals of Care: Advanced Directive information    03/01/2023    9:02 AM  Advanced Directives  Does Patient Have a Medical Advance Directive? Yes  Type of Advance Directive Out of facility DNR (pink MOST or yellow form)  Does patient want to make changes to medical advance directive? No - Patient declined  Pre-existing out of facility DNR order (yellow form or pink MOST form) Yellow form placed in chart (order not valid for inpatient use)      Chief Complaint  Patient presents with   New Admit To SNF    New admission to [facility]     HPI: Patient is a 82 y.o. female seen today for admission to Skilled care  after recent hospitalization for HTN emergency. Pt had left facial droop secondary to herpes zoster. Ct  head showed no acute findings. Pt  has CKD stage 4 and being followed by nephrology.  Pt lives with her husband at [facility]. As per staff pt had baseline cognitive deficits and noticed worsening cognition.  Spoke with therapist this morning, as per them pt is having difficulty with medication management. Pt seen in therapy this morning, she is able to transfer independently, ambulates with a walker.  Denies headache, nausea, vomiting, blurring or double vision Denies chest pain, palpitations, SOB, abdominal pain, nausea, vomiting, dysuria, hematuria, bloody or dark stools.  She was treated for UTI in the hospital, denies dysuria.   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 Most likely scar. Mild subendocardial ischemia. EF 71% LOW RISK.    Cervical disc disease  fracture   Chronic kidney disease    CKD4   COPD (chronic obstructive pulmonary disease) (HCC)    Diverticulosis    DVT (deep venous thrombosis) (HCC)    RLE; Sept '23   History of DVT (deep vein thrombosis) 03/06/2022   History of pulmonary embolus (PE)    Oct '23   Hyperlipidemia    Hypertension    Hypothyroidism (acquired)    hypo    Myocardial infarction (HCC) 11/2014   TIA   Polymyalgia rheumatica (HCC)    2011 Dr. Kellie Simmering   Pulmonary emboli [facility]) 02/06/2022   01/2022 PE/DVT post-op. Lower GI bleed     Treat edema  Start Eliquis at the reduced dose due to Creat>1.5 and age >89   Rheumatoid arthritis (HCC) 2011   Dr.Truslow; fracture knees, hands and wrists -    S/P CABG x 1 1996   CABG--LIMA-LAD for ostial LAD (not felt to be PCI amenable). EF NORMAL then; LIMA now atretic   Shortness of breath dyspnea    with exertion   Stroke (HCC) 11/2014   TIA    Urinary frequency    Past Surgical History:  Procedure Laterality Date   ABDOMINAL AORTIC ANEURYSM REPAIR  1996   Complicated by mesenteric artery stenosis and splenic artery infarction with acquired Asplenia   APPENDECTOMY     BIOPSY  07/08/2022   Procedure: BIOPSY;  Surgeon: Benancio Deeds, MD;  Location: [facility]COPY;  Service: Gastroenterology;;   BIOPSY  11/28/2022   Procedure: BIOPSY;  Surgeon: [name], MD;  Location: [facility] ENDOSCOPY;  Service: Gastroenterology;;   [name]  07/2011   right foot   CARDIAC CATHETERIZATION  2005   (Most recent CATH) - ostial LAD lesion 20-30% (down from 90% initially). Atretic LIMA. Minimal disease the RCA and Circumflex system.   CARPAL TUNNEL RELEASE Left    CATARACT EXTRACTION Bilateral    CERVICAL SPINE SURGERY     plate 1610 Dr. Wynetta Emery   COLONOSCOPY     COLONOSCOPY WITH PROPOFOL N/A 11/28/2022   Procedure: COLONOSCOPY WITH PROPOFOL;  Surgeon: [name], MD;  Location: [facility] ENDOSCOPY;  Service: Gastroenterology;  Laterality: N/A;   CORONARY ARTERY BYPASS GRAFT  1996   INCLUDED AN INTERNAL MAMMARY ARTERY TO THE LAD. EF WAS NORMAL   ESOPHAGOGASTRODUODENOSCOPY (EGD) WITH PROPOFOL N/A 07/08/2022   Procedure: ESOPHAGOGASTRODUODENOSCOPY (EGD) WITH PROPOFOL;  Surgeon: Benancio Deeds, MD;  Location: [facility] ENDOSCOPY;  Service: Gastroenterology;  Laterality: N/A;   ESOPHAGOGASTRODUODENOSCOPY (EGD) WITH PROPOFOL N/A  11/28/2022   Procedure: ESOPHAGOGASTRODUODENOSCOPY (EGD) WITH PROPOFOL;  Surgeon: [name], MD;  Location: [facility] ENDOSCOPY;  Service: Gastroenterology;  Laterality: N/A;   INGUINAL HERNIA REPAIR Right    LAPAROSCOPIC APPENDECTOMY N/A 06/28/2016   Procedure: APPENDECTOMY LAPAROSCOPIC;  Surgeon: [name], MD;  Location: [facility] ORS;  Service: General;  Laterality: N/A;   NM MYOVIEW LTD  06/2010   Fixed anteroseptal, apical and inferoapical defect with moderate size. Most likely scar. Mild subendocardial ischemia. EF 71% LOW RISK.    POLYPECTOMY  11/28/2022   Procedure: POLYPECTOMY;  Surgeon: [name], MD;  Location: [facility] ENDOSCOPY;  Service: Gastroenterology;;   SPLENECTOMY     TOTAL HIP ARTHROPLASTY Left 01/23/2022   Procedure: LEFT TOTAL HIP ARTHROPLASTY ANTERIOR APPROACH;  Surgeon: [name], MD;  Location: [facility] OR;  Service: Orthopedics;  Laterality: Left;  3-C   TRANSTHORACIC ECHOCARDIOGRAM  12/2014   [facility]: Normal LV size & function. EF 55-60%,    vagina  polyp     VIDEO ASSISTED THORACOSCOPY (VATS)/WEDGE RESECTION Left 03/02/2015   Procedure: VIDEO ASSISTED THORACOSCOPY (VATS), MINI THORACOTOMY, LEFT UPPER LOBE WEDGE, TAKE DOWN OF INTERNAL MAMMARY LESIONS, PLACEMENT OF ON-Q PUMP;  Surgeon: [name], MD;  Location: [facility] OR;  Service: Thoracic;  Laterality: Left;   VIDEO BRONCHOSCOPY N/A 03/02/2015   Procedure: BRONCHOSCOPY;  Surgeon: [name], MD;  Location: [facility] OR;  Service: Thoracic;  Laterality: N/A;   VIDEO BRONCHOSCOPY WITH ENDOBRONCHIAL NAVIGATION N/A 10/08/2017   Procedure: VIDEO BRONCHOSCOPY WITH ENDOBRONCHIAL NAVIGATION WITH BIOPSIES OF LEFT UPPER LOBE AND LEFT LOWER LOBE;  Surgeon: [name], MD;  Location: [facility] OR;  Service: Thoracic;  Laterality: N/A;    reports that she quit smoking about 64 years ago. Her smoking use included cigarettes. She has never been exposed to tobacco smoke. She has never used smokeless tobacco. She reports  current alcohol use. She reports that she does not use drugs. Social History   Socioeconomic History   Marital status: Married    Spouse name: Not on file   Number of children: 4   Years of education: Not on file   Highest education level: Bachelor's degree (e.g., BA, AB, BS)  Occupational History   Occupation: retired    Associate Professor: RETIRED  Tobacco Use   Smoking status: Former    Current packs/day: 0.00    Types: Cigarettes    Quit date: 12/04/1958    Years since quitting: 64.2    Passive exposure: Never   Smokeless tobacco: Never  Vaping Use   Vaping status: Never Used  Substance and Sexual Activity   Alcohol use: Yes    Comment: hardly ever   Drug use: No   Sexual activity: Not Currently  Other Topics Concern   Not on file  Social History Narrative   [facility]  MARRIED -RETIRED ; 4 CHILDREN,2 CHILDREN.    [facility] since [facility]   Social Determinants of Health   Financial Resource Strain: Low Risk  (09/05/2022)   Overall Financial Resource Strain (CARDIA)    Difficulty of Paying Living Expenses: Not hard at all  Food Insecurity: No Food Insecurity (02/17/2023)   Hunger Vital Sign    Worried About Running Out of Food in the Last Year: Never true    Ran Out of Food in the Last Year: Never true  Transportation Needs: No Transportation Needs (02/17/2023)   PRAPARE - Administrator, Civil Service (Medical): No    Lack of Transportation (Non-Medical): No  Physical Activity: Insufficiently Active (01/22/2023)   Exercise Vital Sign    Days of Exercise per Week: 2 days    Minutes of Exercise per Session: 10 min  Stress: Stress Concern Present (01/22/2023)   [employer] of Occupational Health - Occupational Stress Questionnaire    Feeling of Stress : To some extent  Social Connections: Moderately Integrated (09/05/2022)   Social Connection and Isolation Panel [NHANES]    Frequency of Communication with Friends and  Family: More than three times a week    Frequency of Social Gatherings with Friends and Family: Patient declined    Attends Religious Services: Never    Database administrator or Organizations: Yes    Attends Banker Meetings: Patient declined    Marital Status: Married  Catering manager Violence: Not At Risk (02/17/2023)   Humiliation, Afraid, Rape, and Kick questionnaire    Fear of Current or Ex-Partner:  No    Emotionally Abused: No    Physically Abused: No    Sexually Abused: No    Functional Status Survey:    Family History  Problem Relation Age of Onset   Hypertension Mother    Diabetes Mother    Heart disease Mother    Hyperlipidemia Mother    Stroke Father    Hyperlipidemia Sister    Hypertension Sister    Hypertension Daughter    Cancer Paternal Uncle        Deceased from cancer not sure of site   Hypertension Son    Hyperlipidemia Son    Hyperlipidemia Son    Hypertension Son    Hyperlipidemia Son    Hypertension Son    Hypertension Other    Coronary artery disease Other    Asthma Neg Hx    Colon cancer Neg Hx     Health Maintenance  Topic Date Due   Fecal DNA (Cologuard)  Never done   COVID-19 Vaccine (6 - 2023-24 season) 12/31/2022   Medicare Annual Wellness (AWV)  06/16/2023   DTaP/Tdap/Td (3 - Td or Tdap) 06/01/2026   Pneumonia Vaccine 33+ Years old  Completed   INFLUENZA VACCINE  Completed   DEXA SCAN  Completed   Zoster Vaccines- Shingrix  Completed   HPV VACCINES  Aged Out   Colonoscopy  Discontinued    Allergies  Allergen Reactions   Nsaids Other (See Comments)    Stomach upset Told to avoid due to Eliquis   Aspirin Other (See Comments)    Stomach upset Told to avoid due to Eliquis   Codeine Nausea And Vomiting   Eliquis [Apixaban] Other (See Comments)    Gi upset    Nitrostat [Nitroglycerin] Other (See Comments)    Bradycardia. Drop in heart rate    Tramadol Other (See Comments)    Confusion    Outpatient Encounter  Medications as of 03/01/2023  Medication Sig   acetaminophen (TYLENOL) 325 MG tablet Take 2 tablets (650 mg total) by mouth 2 (two) times daily as needed (generalized pain).   albuterol (VENTOLIN HFA) 108 (90 Base) MCG/ACT inhaler Inhale 1-2 puffs into the lungs every 4 (four) hours as needed for wheezing or shortness of breath.   allopurinol (ZYLOPRIM) 100 MG tablet Take 1 tablet (100 mg total) by mouth daily.   amLODipine (NORVASC) 10 MG tablet Take 1 tablet (10 mg total) by mouth daily.   ascorbic acid (VITAMIN C) 500 MG tablet Take 1 tablet (500 mg total) by mouth daily.   atorvastatin (LIPITOR) 80 MG tablet Take 1 tablet (80 mg total) by mouth daily.   B Complex-C (B-COMPLEX WITH VITAMIN C) tablet Take 1 tablet by mouth daily.   bisacodyl (DULCOLAX) 5 MG EC tablet Take 2 tablets (10 mg total) by mouth daily as needed for moderate constipation or severe constipation.   carvedilol (COREG) 6.25 MG tablet Take 1 tablet (6.25 mg total) by mouth 2 (two) times daily with a meal.   Cholecalciferol (VITAMIN D3) 50 MCG (2000 UT) capsule Take 1 capsule (2,000 Units total) by mouth daily.   cloNIDine (CATAPRES - DOSED IN MG/24 HR) 0.2 mg/24hr patch Place 1 patch (0.2 mg total) onto the skin once a week.   clopidogrel (PLAVIX) 75 MG tablet Take 1 tablet (75 mg total) by mouth every other day.   Cyanocobalamin 2500 MCG TABS Take 2,500 mcg by mouth daily.   docusate sodium (COLACE) 100 MG capsule Take 1 capsule (100 mg total) by  mouth 2 (two) times daily as needed for mild constipation.   hydrOXYzine (ATARAX) 25 MG tablet Take 1 tablet (25 mg total) by mouth 3 (three) times daily as needed for itching.   leflunomide (ARAVA) 20 MG tablet Take 1 tablet (20 mg total) by mouth daily.   levothyroxine (SYNTHROID) 88 MCG tablet Take 1 tablet (88 mcg total) by mouth daily before breakfast.   melatonin 3 MG TABS tablet Take 1 tablet (3 mg total) by mouth at bedtime as needed.   Multiple Vitamins-Minerals (CENTRUM  ADULT PO) Take 1 tablet by mouth daily.   pantoprazole (PROTONIX) 40 MG tablet Take 1 tablet (40 mg total) by mouth daily.   sertraline (ZOLOFT) 25 MG tablet Take 25 mg by mouth daily.   triamcinolone (NASACORT) 55 MCG/ACT AERO nasal inhaler Place 1 spray into the nose daily.   sertraline (ZOLOFT) 50 MG tablet Take 0.5 tablets (25 mg total) by mouth daily. (Patient not taking: Reported on 03/01/2023)   No facility-administered encounter medications on file as of 03/01/2023.    Review of Systems  Constitutional:  Negative for chills and fever.  HENT:  Negative for sinus pressure and sore throat.   Respiratory:  Negative for cough, shortness of breath and wheezing.   Cardiovascular:  Negative for chest pain, palpitations and leg swelling.  Gastrointestinal:  Negative for abdominal distention, abdominal pain, blood in stool, constipation, diarrhea, nausea and vomiting.  Genitourinary:  Negative for dysuria, frequency and urgency.  Neurological:  Negative for dizziness, weakness and numbness.  Psychiatric/Behavioral:  Negative for confusion.     Vitals:   03/01/23 0859  BP: (!) 141/65  Pulse: 85  Resp: 17  Temp: 98 F (36.7 C)  SpO2: 92%  Weight: 124 lb (56.2 kg)  Height: 5\' 3"  (1.6 m)   Body mass index is 21.97 kg/m. Physical Exam Constitutional:      Appearance: Normal appearance.  HENT:     Head: Normocephalic and atraumatic.  Cardiovascular:     Rate and Rhythm: Normal rate and regular rhythm.     Heart sounds: No murmur heard. Pulmonary:     Effort: Pulmonary effort is normal. No respiratory distress.     Breath sounds: Normal breath sounds. No wheezing.  Abdominal:     General: Bowel sounds are normal. There is no distension.     Tenderness: There is no abdominal tenderness. There is no guarding or rebound.  Musculoskeletal:        General: No swelling or tenderness.  Neurological:     Mental Status: She is alert. Mental status is at baseline.     Sensory: No  sensory deficit.     Motor: No weakness.     Labs reviewed: Basic Metabolic Panel: Recent Labs    07/24/22 1009 07/25/22 0533 08/01/22 0800 02/16/23 0356 02/18/23 0413 02/24/23 0435 02/25/23 0448 02/26/23 0416  NA 138 139   < > 136   < > 128* 127* 129*  K 4.3 4.0   < > 3.8   < > 4.9 4.6 4.3  CL 103 104   < > 98   < > 95* 97* 96*  CO2 21* 21*   < > 19*   < > 19* 18* 19*  GLUCOSE 132* 91   < > 110*   < > 103* 94 81  BUN 65* 62*   < > 77*   < > 106* 99* 100*  CREATININE 3.84* 3.71*   < > 4.28*   < > 4.99*  4.57* 4.66*  CALCIUM 9.3 9.6   < > 9.7   < > 9.9 9.7 10.2  MG  --  1.9  --  2.2   < > 2.2 2.2 2.2  PHOS 4.9* 5.4*  --  5.7*  --   --   --   --    < > = values in this interval not displayed.   Liver Function Tests: Recent Labs    10/04/22 1300 12/07/22 2140 02/15/23 0645  AST 25 29 24   ALT 18 17 17   ALKPHOS 73 85 81  BILITOT 0.5 1.2 0.9  PROT 5.8* 6.1* 7.0  ALBUMIN 3.0* 3.2* 3.6   No results for input(s): "LIPASE", "AMYLASE" in the last 8760 hours. No results for input(s): "AMMONIA" in the last 8760 hours. CBC: Recent Labs    09/30/22 1849 10/11/22 1325 11/27/22 0105 11/27/22 0506 02/15/23 0551 02/15/23 0635 02/24/23 0435 02/25/23 0448 02/26/23 0416  WBC 9.8  --  8.5   < > 9.4   < > 9.2 10.9* 10.7*  NEUTROABS 6.3  --  4.4  --  7.9*  --   --   --   --   HGB 10.4*   < > 10.0*   < > 12.8   < > 11.3* 10.6* 10.3*  HCT 32.9*  --  32.0*   < > 39.6   < > 33.5* 31.5* 31.5*  MCV 97.9  --  101.3*   < > 100.8*   < > 97.4 95.7 96.6  PLT 343  --  227   < > 329   < > 363 361 366   < > = values in this interval not displayed.   Cardiac Enzymes: No results for input(s): "CKTOTAL", "CKMB", "CKMBINDEX", "TROPONINI" in the last 8760 hours. BNP: Invalid input(s): "POCBNP" No results found for: "HGBA1C" Lab Results  Component Value Date   TSH 2.829 02/15/2023   Lab Results  Component Value Date   VITAMINB12 >1500 (H) 09/05/2022   Lab Results  Component Value Date    FOLATE 11.5 09/04/2012   Lab Results  Component Value Date   IRON 76 01/30/2023   TIBC 256 01/30/2023   FERRITIN 267 01/30/2023    Imaging and Procedures obtained prior to SNF admission: EEG adult  Result Date: 02/15/2023 Charlsie Quest, MD     02/15/2023  4:07 PM Patient Name: CHANTINA LAIDIG MRN: [id number] Epilepsy Attending: Charlsie Quest Referring Physician/Provider: Lynnell Catalan, MD Date: 02/15/2023 Duration: 22.26 mins Patient history: 82 yo F with acutely altered mental status with associated hypertensive crisis, but without visible seizure activity and only left facial droop as a consistent focal deficit. EEG to evaluate for seizure Level of alertness: Awake AEDs during EEG study: None Technical aspects: This EEG study was done with scalp electrodes positioned according to the 10-20 International system of electrode placement. Electrical activity was reviewed with band pass filter of 1-70Hz , sensitivity of 7 uV/mm, display speed of 31mm/sec with a 60Hz  notched filter applied as appropriate. EEG data were recorded continuously and digitally stored.  Video monitoring was available and reviewed as appropriate. Description: EEG showed continuous generalized predominantly 5 to 6 Hz theta slowing admixed with intermittent 2-3hz  delta slowing. Hyperventilation and photic stimulation were not performed.   ABNORMALITY - Continuous slow, generalized IMPRESSION: This study is suggestive of mild to moderate diffuse encephalopathy. No seizures or epileptiform discharges were seen throughout the recording. Charlsie Quest   MR BRAIN WO CONTRAST  Result Date: 02/15/2023  CLINICAL DATA:  Mental status change, unknown cause. EXAM: MRI HEAD WITHOUT CONTRAST TECHNIQUE: Multiplanar, multiecho pulse sequences of the brain and surrounding structures were obtained without intravenous contrast. COMPARISON:  Head CT February 15, 2023. FINDINGS: Brain: No acute infarction, hemorrhage, extra-axial collection  or mass lesion. Moderate supratentorial and prominence of the cerebral sulci related to parenchymal volume loss. Minimal chronic white matter. Vascular: Normal flow voids.  Hypoplastic vertebral. Skull and upper cervical spine: Postsurgical changes in the visualized upper cervical spine. No focal identified. Sinuses/Orbits: Mild mucosal thickening of the bilateral mastoids and ethmoid cells. Bilateral lens surgery. Other: None. IMPRESSION: 1. No acute intracranial abnormality. 2. Moderate parenchymal volume loss. Electronically Signed   By: Baldemar Lenis M.D.   On: 02/15/2023 15:22   DG Chest Port 1 View  Result Date: 02/15/2023 CLINICAL DATA:  82 year old female unresponsive patient. EXAM: PORTABLE CHEST 1 VIEW COMPARISON:  Chest x-ray 12/10/2022. FINDINGS: Lung volumes are low. No consolidative airspace disease. No pleural effusions. No pneumothorax. No evidence of pulmonary edema. Heart size is normal. Upper mediastinal contours are within normal limits. Status post median sternotomy for CABG. Atherosclerotic calcifications are noted in the thoracic aorta. IMPRESSION: 1. Low lung volumes without radiographic evidence of acute cardiopulmonary disease. 2. Aortic atherosclerosis. Electronically Signed   By: Trudie Reed M.D.   On: 02/15/2023 07:33   CT HEAD WO CONTRAST  Result Date: 02/15/2023 CLINICAL DATA:  Lethargy.  Left-sided facial droop and weakness. EXAM: CT HEAD WITHOUT CONTRAST CT CERVICAL SPINE WITHOUT CONTRAST TECHNIQUE: Multidetector CT imaging of the head and cervical spine was performed following the standard protocol without intravenous contrast. Multiplanar CT image reconstructions of the cervical spine were also generated. RADIATION DOSE REDUCTION: This exam was performed according to the departmental dose-optimization program which includes automated exposure control, adjustment of the mA and/or kV according to patient size and/or use of iterative reconstruction  technique. COMPARISON:  08/02/2022 FINDINGS: CT HEAD FINDINGS Brain: No evidence of acute infarction, hemorrhage, hydrocephalus, extra-axial collection or mass lesion/mass effect. Generalized brain atrophy in keeping with age Vascular: No hyperdense vessel or unexpected calcification. Skull: Normal. Negative for fracture or focal lesion. Sinuses/Orbits: No acute finding. CT CERVICAL SPINE FINDINGS Alignment: Normal. Skull base and vertebrae: No acute fracture. No primary bone lesion or focal pathologic process. C3-C5 ACDF with solid arthrodesis Soft tissues and spinal canal: No prevertebral fluid or swelling. No visible canal hematoma. Disc levels: Disc collapse and endplate spurring especially at C5-6 to C6-7. 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 Upper chest: No visible injury IMPRESSION: No acute finding in the head or cervical spine. No specific cause for symptoms. Electronically Signed   By: Tiburcio Pea M.D.   On: 02/15/2023 06:57   CT CERVICAL SPINE WO CONTRAST  Result Date: 02/15/2023 CLINICAL DATA:  Lethargy.  Left-sided facial droop and weakness. EXAM: CT HEAD WITHOUT CONTRAST CT CERVICAL SPINE WITHOUT CONTRAST TECHNIQUE: Multidetector CT imaging of the head and cervical spine was performed following the standard protocol without intravenous contrast. Multiplanar CT image reconstructions of the cervical spine were also generated. RADIATION DOSE REDUCTION: This exam was performed according to the departmental dose-optimization program which includes automated exposure control, adjustment of the mA and/or kV according to patient size and/or use of iterative reconstruction technique. COMPARISON:  08/02/2022 FINDINGS: CT HEAD FINDINGS Brain: No evidence of acute infarction, hemorrhage, hydrocephalus, extra-axial collection or mass lesion/mass effect. Generalized brain atrophy in keeping with age Vascular: No hyperdense vessel or unexpected calcification. Skull: Normal.  Negative for  fracture or focal lesion. Sinuses/Orbits: No acute finding. CT CERVICAL SPINE FINDINGS Alignment: Normal. Skull base and vertebrae: No acute fracture. No primary bone lesion or focal pathologic process. C3-C5 ACDF with solid arthrodesis Soft tissues and spinal canal: No prevertebral fluid or swelling. No visible canal hematoma. Disc levels: Disc collapse and endplate spurring especially at C5-6 to C6-7. 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 Upper chest: No visible injury IMPRESSION: No acute finding in the head or cervical spine. No specific cause for symptoms. Electronically Signed   By: Tiburcio Pea M.D.   On: 02/15/2023 06:57    Assessment/Plan  Cognitive deficits  Will check MMSE  Pt might need help with medication management at discharge from skilled care and [facility]    Essential Hypertension  Bp 140- 160 systolic for the last few days  Will monitor bp  Avoid salty foods Cont with amlodipine, clonidine patch, coreg    CKD     Latest Ref Rng & Units 02/26/2023    4:16 AM 02/25/2023    4:48 AM 02/24/2023    4:35 AM  BMP  Glucose 70 - 99 mg/dL 81  94  657   BUN 8 - 23 mg/dL 846  99  962   Creatinine 0.44 - 1.00 mg/dL 9.52  8.41  3.24   Sodium 135 - 145 mmol/L 129  127  128   Potassium 3.5 - 5.1 mmol/L 4.3  4.6  4.9   Chloride 98 - 111 mmol/L 96  97  95   CO2 22 - 32 mmol/L 19  18  19    Calcium 8.9 - 10.3 mg/dL 40.1  9.7  9.9    Follow up with nephrology   Rheumatoid arthritis Denies pain  Cont with arava   Pseudo gout  Rt knee pain improved Cont with allopurinol  H/o DVT /pE Not on AC due to recurrent GI bleeds  CAD  Cont with plavix  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

## 2023-03-02 ENCOUNTER — Non-Acute Institutional Stay (INDEPENDENT_AMBULATORY_CARE_PROVIDER_SITE_OTHER): Payer: Medicare PPO | Admitting: Sports Medicine

## 2023-03-02 ENCOUNTER — Encounter: Payer: Self-pay | Admitting: Sports Medicine

## 2023-03-02 ENCOUNTER — Telehealth: Payer: Self-pay | Admitting: Orthopedic Surgery

## 2023-03-02 ENCOUNTER — Non-Acute Institutional Stay (SKILLED_NURSING_FACILITY): Payer: Self-pay | Admitting: Sports Medicine

## 2023-03-02 DIAGNOSIS — I10 Essential (primary) hypertension: Secondary | ICD-10-CM | POA: Diagnosis not present

## 2023-03-02 DIAGNOSIS — K649 Unspecified hemorrhoids: Secondary | ICD-10-CM | POA: Diagnosis not present

## 2023-03-02 DIAGNOSIS — N184 Chronic kidney disease, stage 4 (severe): Secondary | ICD-10-CM | POA: Diagnosis not present

## 2023-03-02 DIAGNOSIS — R400 Somnolence: Secondary | ICD-10-CM | POA: Diagnosis not present

## 2023-03-02 NOTE — Progress Notes (Unsigned)
Provider:  Venita Sheffield MD Location:   [facility]   Place of Service:   Skilled care   PCP: Plotnikov, Georgina Quint, MD Patient Care Team: [name], MD as PCP - General (Internal Medicine) [name], MD as PCP - Cardiology (Cardiology) [name], MD as Consulting Physician (Cardiology) [name], MD as Consulting Physician (Rheumatology) Armbruster, [name], MD as Consulting Physician (Gastroenterology) [name], MD as Consulting Physician (Pulmonary Disease) [name], MD (Inactive) as Consulting Physician (Cardiothoracic Surgery) [name], MD as Consulting Physician (Obstetrics and Gynecology) Plotnikov, Georgina Quint, MD Colletta Maryland, RN as [facility] Care Management Roger Kill, NP as Nurse Practitioner (Nephrology)  Extended Emergency Contact Information Primary Emergency Contact: [name] Address: [address]           [place], [place] [address] [place] Home Phone: [phone] Mobile Phone: [phone] Relation: Spouse Secondary Emergency Contact: [facility] Address: [address]          [place], [place] [address] [place] Mobile Phone: [phone] Relation: Son  Code Status:  Goals of Care: Advanced Directive information    03/02/2023    2:16 PM  Advanced Directives  Does Patient Have a Medical Advance Directive? Yes  Type of Advance Directive Out of facility DNR (pink MOST or yellow form)  Does patient want to make changes to medical advance directive? No - Patient declined      No chief complaint on file.   HPI: Patient is a 82 y.o. female seen today for  acute visit for discomfort from hemorrhoids Pt seen and examined in her room, daughter at bedside  Pt c/o pain from hemorrhoids Denies bleeding Had soft bowel movement this morning Denies abdominal pain, nausea, vomiting. Daughter states that pt is sleepy and  tired. 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 Most likely scar. Mild subendocardial ischemia. EF 71% LOW RISK.    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 Kellie Simmering   Pulmonary emboli [facility]) 02/06/2022   01/2022 PE/DVT post-op. Lower  GI bleed     Treat edema  Start Eliquis at the reduced dose due to Creat>1.5 and age >67   Rheumatoid arthritis (HCC) 2011   Dr.Truslow; fracture knees, hands and wrists -    S/P CABG x 1 1996   CABG--LIMA-LAD for ostial LAD (not felt to be PCI amenable). EF NORMAL then; LIMA now atretic   Shortness of breath dyspnea    with exertion   Stroke (HCC) 11/2014   TIA    Urinary frequency    Past Surgical History:  Procedure Laterality Date   ABDOMINAL AORTIC ANEURYSM REPAIR  1996   Complicated by mesenteric  artery stenosis and splenic artery infarction with acquired Asplenia   APPENDECTOMY     BIOPSY  07/08/2022   Procedure: BIOPSY;  Surgeon: Benancio Deeds, MD;  Location[facility] ENDOSCOPY;  Service: Gastroenterology;;   BIOPSY  11/28/2022   Procedure: BIOPSY;  Surgeon: [name], MD;  Location: [facility] ENDOSCOPY;  Service: Gastroenterology;;   [name]  07/2011   right foot   CARDIAC CATHETERIZATION  2005   (Most recent CATH) - ostial LAD lesion 20-30% (down from 90% initially). Atretic LIMA. Minimal disease the RCA and Circumflex system.   CARPAL TUNNEL RELEASE Left    CATARACT EXTRACTION Bilateral    CERVICAL SPINE SURGERY     plate [id number] Dr. Wynetta Emery   COLONOSCOPY     COLONOSCOPY WITH PROPOFOL N/A 11/28/2022   Procedure: COLONOSCOPY WITH PROPOFOL;  Surgeon: [name], MD;  Location: [facility] ENDOSCOPY;  Service: Gastroenterology;  Laterality: N/A;   CORONARY ARTERY BYPASS GRAFT  1996   INCLUDED AN INTERNAL MAMMARY ARTERY TO THE LAD. EF WAS NORMAL   ESOPHAGOGASTRODUODENOSCOPY (EGD) WITH PROPOFOL N/A 07/08/2022   Procedure: ESOPHAGOGASTRODUODENOSCOPY (EGD) WITH PROPOFOL;  Surgeon: Benancio Deeds, MD;  Location: [facility] ENDOSCOPY;  Service: Gastroenterology;  Laterality: N/A;   ESOPHAGOGASTRODUODENOSCOPY (EGD) WITH PROPOFOL N/A 11/28/2022   Procedure: ESOPHAGOGASTRODUODENOSCOPY (EGD) WITH PROPOFOL;  Surgeon: [name], MD;  Location: [facility] ENDOSCOPY;  Service: Gastroenterology;  Laterality: N/A;   INGUINAL HERNIA REPAIR Right    LAPAROSCOPIC APPENDECTOMY N/A 06/28/2016   Procedure: APPENDECTOMY LAPAROSCOPIC;  Surgeon: [name], MD;  Location: [facility] ORS;  Service: General;  Laterality: N/A;   NM MYOVIEW LTD  06/2010   Fixed anteroseptal, apical and inferoapical defect with moderate size. Most likely scar. Mild subendocardial ischemia. EF 71% LOW RISK.    POLYPECTOMY  11/28/2022   Procedure: POLYPECTOMY;  Surgeon: [name], MD;  Location: [facility] ENDOSCOPY;  Service: Gastroenterology;;    SPLENECTOMY     TOTAL HIP ARTHROPLASTY Left 01/23/2022   Procedure: LEFT TOTAL HIP ARTHROPLASTY ANTERIOR APPROACH;  Surgeon: [name], MD;  Location: [facility] OR;  Service: Orthopedics;  Laterality: Left;  3-C   TRANSTHORACIC ECHOCARDIOGRAM  12/2014   [facility]: Normal LV size & function. EF 55-60%,    vagina polyp     VIDEO ASSISTED THORACOSCOPY (VATS)/WEDGE RESECTION Left 03/02/2015   Procedure: VIDEO ASSISTED THORACOSCOPY (VATS), MINI THORACOTOMY, LEFT UPPER LOBE WEDGE, TAKE DOWN OF INTERNAL MAMMARY LESIONS, PLACEMENT OF ON-Q PUMP;  Surgeon: [name], MD;  Location: [facility] OR;  Service: Thoracic;  Laterality: Left;   VIDEO BRONCHOSCOPY N/A 03/02/2015   Procedure: BRONCHOSCOPY;  Surgeon: [name], MD;  Location: [facility] OR;  Service: Thoracic;  Laterality: N/A;   VIDEO BRONCHOSCOPY WITH ENDOBRONCHIAL NAVIGATION N/A 10/08/2017   Procedure: VIDEO BRONCHOSCOPY WITH ENDOBRONCHIAL NAVIGATION WITH BIOPSIES OF LEFT UPPER LOBE AND LEFT LOWER LOBE;  Surgeon: [name], MD;  Location: [facility] OR;  Service: Thoracic;  Laterality: N/A;    reports that she quit smoking about 64 years ago. Her smoking use included cigarettes. She has never been exposed to tobacco smoke. She has never used smokeless tobacco. She reports current alcohol use. She reports that she does not use drugs. Social History   Socioeconomic History   Marital status: Married    Spouse name: Not on file   Number of children: 4   Years of education: Not on file   Highest education level: Bachelor's degree (e.g., BA, AB, BS)  Occupational History   Occupation: retired    Associate Professor: RETIRED  Tobacco Use   Smoking status: Former    Current packs/day: 0.00    Types: Cigarettes    Quit date: 12/04/1958    Years since quitting: 64.2    Passive exposure: Never   Smokeless tobacco: Never  Vaping Use   Vaping status: Never Used  Substance and Sexual Activity   Alcohol use: Yes    Comment: hardly ever   Drug use:  No   Sexual activity: Not Currently  Other Topics Concern   Not on file  Social History Narrative   [facility]  MARRIED -RETIRED ; 4 CHILDREN,2 CHILDREN.    [facility]   Social Determinants of Health   Financial Resource Strain: Low Risk  (09/05/2022)   Overall Financial Resource Strain (CARDIA)    Difficulty of Paying Living Expenses: Not hard at all  Food Insecurity: No Food Insecurity (02/17/2023)   Hunger Vital Sign    Worried About Running Out of Food in the Last Year: Never true    Ran Out of Food in the Last Year: Never true  Transportation Needs: No Transportation Needs (02/17/2023)   PRAPARE - Administrator, Civil Service (Medical): No    Lack of Transportation (Non-Medical): No  Physical Activity: Insufficiently Active (01/22/2023)   Exercise Vital Sign    Days of Exercise per Week: 2 days    Minutes of Exercise per Session: 10 min  Stress: Stress Concern Present (01/22/2023)   [employer] of Occupational Health - Occupational Stress Questionnaire    Feeling of Stress : To some extent  Social Connections: Moderately Integrated (09/05/2022)   Social Connection and Isolation Panel [NHANES]    Frequency of Communication with Friends and Family: More than three times a week    Frequency of Social Gatherings with Friends and Family: Patient declined    Attends Religious Services: Never    Database administrator or Organizations: Yes    Attends Banker Meetings: Patient declined    Marital Status: Married  Catering manager Violence: Not At Risk (02/17/2023)   Humiliation, Afraid, Rape, and Kick questionnaire    Fear of Current or Ex-Partner: No    Emotionally Abused: No    Physically Abused: No    Sexually Abused: No    Functional Status Survey:    Family History  Problem Relation Age of Onset   Hypertension Mother    Diabetes Mother    Heart disease Mother    Hyperlipidemia Mother     Stroke Father    Hyperlipidemia Sister    Hypertension Sister    Hypertension Daughter    Cancer Paternal Uncle        Deceased from cancer not sure of site   Hypertension Son    Hyperlipidemia Son  Hyperlipidemia Son    Hypertension Son    Hyperlipidemia Son    Hypertension Son    Hypertension Other    Coronary artery disease Other    Asthma Neg Hx    Colon cancer Neg Hx     Health Maintenance  Topic Date Due   Fecal DNA (Cologuard)  Never done   COVID-19 Vaccine (6 - 2023-24 season) 12/31/2022   Medicare Annual Wellness (AWV)  06/16/2023   DTaP/Tdap/Td (3 - Td or Tdap) 06/01/2026   Pneumonia Vaccine 47+ Years old  Completed   INFLUENZA VACCINE  Completed   DEXA SCAN  Completed   Zoster Vaccines- Shingrix  Completed   HPV VACCINES  Aged Out   Colonoscopy  Discontinued    Allergies  Allergen Reactions   Nsaids Other (See Comments)    Stomach upset Told to avoid due to Eliquis   Aspirin Other (See Comments)    Stomach upset Told to avoid due to Eliquis   Codeine Nausea And Vomiting   Eliquis [Apixaban] Other (See Comments)    Gi upset    Nitrostat [Nitroglycerin] Other (See Comments)    Bradycardia. Drop in heart rate    Tramadol Other (See Comments)    Confusion    Outpatient Encounter Medications as of 03/02/2023  Medication Sig   acetaminophen (TYLENOL) 325 MG tablet Take 2 tablets (650 mg total) by mouth 2 (two) times daily as needed (generalized pain).   albuterol (VENTOLIN HFA) 108 (90 Base) MCG/ACT inhaler Inhale 1-2 puffs into the lungs every 4 (four) hours as needed for wheezing or shortness of breath.   allopurinol (ZYLOPRIM) 100 MG tablet Take 1 tablet (100 mg total) by mouth daily.   amLODipine (NORVASC) 10 MG tablet Take 1 tablet (10 mg total) by mouth daily.   ascorbic acid (VITAMIN C) 500 MG tablet Take 1 tablet (500 mg total) by mouth daily.   atorvastatin (LIPITOR) 80 MG tablet Take 1 tablet (80 mg total) by mouth daily.   B Complex-C  (B-COMPLEX WITH VITAMIN C) tablet Take 1 tablet by mouth daily.   bisacodyl (DULCOLAX) 5 MG EC tablet Take 2 tablets (10 mg total) by mouth daily as needed for moderate constipation or severe constipation.   carvedilol (COREG) 6.25 MG tablet Take 1 tablet (6.25 mg total) by mouth 2 (two) times daily with a meal.   Cholecalciferol (VITAMIN D3) 50 MCG (2000 UT) capsule Take 1 capsule (2,000 Units total) by mouth daily.   cloNIDine (CATAPRES - DOSED IN MG/24 HR) 0.2 mg/24hr patch Place 1 patch (0.2 mg total) onto the skin once a week.   clopidogrel (PLAVIX) 75 MG tablet Take 1 tablet (75 mg total) by mouth every other day.   Cyanocobalamin 2500 MCG TABS Take 2,500 mcg by mouth daily.   docusate sodium (COLACE) 100 MG capsule Take 1 capsule (100 mg total) by mouth 2 (two) times daily as needed for mild constipation.   hydrOXYzine (ATARAX) 25 MG tablet Take 1 tablet (25 mg total) by mouth 3 (three) times daily as needed for itching.   leflunomide (ARAVA) 20 MG tablet Take 1 tablet (20 mg total) by mouth daily.   levothyroxine (SYNTHROID) 88 MCG tablet Take 1 tablet (88 mcg total) by mouth daily before breakfast.   melatonin 3 MG TABS tablet Take 1 tablet (3 mg total) by mouth at bedtime as needed.   Multiple Vitamins-Minerals (CENTRUM ADULT PO) Take 1 tablet by mouth daily.   pantoprazole (PROTONIX) 40 MG tablet Take 1  tablet (40 mg total) by mouth daily.   sertraline (ZOLOFT) 25 MG tablet Take 25 mg by mouth daily.   triamcinolone (NASACORT) 55 MCG/ACT AERO nasal inhaler Place 1 spray into the nose daily.   No facility-administered encounter medications on file as of 03/02/2023.    Review of Systems  Constitutional:  Negative for chills and fever.  Respiratory:  Negative for cough, shortness of breath and wheezing.   Cardiovascular:  Negative for chest pain, palpitations and leg swelling.  Gastrointestinal:  Positive for rectal pain. Negative for abdominal distention, abdominal pain, blood in  stool, constipation, diarrhea, nausea and vomiting.  Genitourinary:  Negative for dysuria, frequency and urgency.  Neurological:  Negative for dizziness, weakness and numbness.  Psychiatric/Behavioral:  Negative for confusion.     There were no vitals filed for this visit. There is no height or weight on file to calculate BMI. Physical Exam Constitutional:      Appearance: Normal appearance.  HENT:     Head: Normocephalic and atraumatic.  Cardiovascular:     Rate and Rhythm: Normal rate and regular rhythm.     Heart sounds: No murmur heard. Pulmonary:     Effort: Pulmonary effort is normal. No respiratory distress.     Breath sounds: Normal breath sounds. No wheezing.  Abdominal:     General: Bowel sounds are normal. There is no distension.     Tenderness: There is no abdominal tenderness. There is no guarding or rebound.  Genitourinary:    Comments: Per rectal exam - no external bleeding  Fobt neg  Anal sphincter- normal tone Ext hemorrhoids +  Musculoskeletal:        General: No swelling or tenderness.  Neurological:     Mental Status: She is alert. Mental status is at baseline.     Sensory: No sensory deficit.     Motor: No weakness.     Labs reviewed: Basic Metabolic Panel: Recent Labs    07/24/22 1009 07/25/22 0533 08/01/22 0800 02/16/23 0356 02/18/23 0413 02/24/23 0435 02/25/23 0448 02/26/23 0416  NA 138 139   < > 136   < > 128* 127* 129*  K 4.3 4.0   < > 3.8   < > 4.9 4.6 4.3  CL 103 104   < > 98   < > 95* 97* 96*  CO2 21* 21*   < > 19*   < > 19* 18* 19*  GLUCOSE 132* 91   < > 110*   < > 103* 94 81  BUN 65* 62*   < > 77*   < > 106* 99* 100*  CREATININE 3.84* 3.71*   < > 4.28*   < > 4.99* 4.57* 4.66*  CALCIUM 9.3 9.6   < > 9.7   < > 9.9 9.7 10.2  MG  --  1.9  --  2.2   < > 2.2 2.2 2.2  PHOS 4.9* 5.4*  --  5.7*  --   --   --   --    < > = values in this interval not displayed.   Liver Function Tests: Recent Labs    10/04/22 1300 12/07/22 2140  02/15/23 0645  AST 25 29 24   ALT 18 17 17   ALKPHOS 73 85 81  BILITOT 0.5 1.2 0.9  PROT 5.8* 6.1* 7.0  ALBUMIN 3.0* 3.2* 3.6   No results for input(s): "LIPASE", "AMYLASE" in the last 8760 hours. No results for input(s): "AMMONIA" in the last 8760 hours. CBC: Recent Labs  09/30/22 1849 10/11/22 1325 11/27/22 0105 11/27/22 0506 02/15/23 0551 02/15/23 0635 02/24/23 0435 02/25/23 0448 02/26/23 0416  WBC 9.8  --  8.5   < > 9.4   < > 9.2 10.9* 10.7*  NEUTROABS 6.3  --  4.4  --  7.9*  --   --   --   --   HGB 10.4*   < > 10.0*   < > 12.8   < > 11.3* 10.6* 10.3*  HCT 32.9*  --  32.0*   < > 39.6   < > 33.5* 31.5* 31.5*  MCV 97.9  --  101.3*   < > 100.8*   < > 97.4 95.7 96.6  PLT 343  --  227   < > 329   < > 363 361 366   < > = values in this interval not displayed.   Cardiac Enzymes: No results for input(s): "CKTOTAL", "CKMB", "CKMBINDEX", "TROPONINI" in the last 8760 hours. BNP: Invalid input(s): "POCBNP" No results found for: "HGBA1C" Lab Results  Component Value Date   TSH 2.829 02/15/2023   Lab Results  Component Value Date   VITAMINB12 >1500 (H) 09/05/2022   Lab Results  Component Value Date   FOLATE 11.5 09/04/2012   Lab Results  Component Value Date   IRON 76 01/30/2023   TIBC 256 01/30/2023   FERRITIN 267 01/30/2023    Imaging and Procedures obtained prior to SNF admission: EEG adult  Result Date: 02/15/2023 Charlsie Quest, MD     02/15/2023  4:07 PM Patient Name: [name] MRN: [id number] Epilepsy Attending: Charlsie Quest Referring Physician/Provider: Lynnell Catalan, MD Date: 02/15/2023 Duration: 22.26 mins Patient history: 82 yo F with acutely altered mental status with associated hypertensive crisis, but without visible seizure activity and only left facial droop as a consistent focal deficit. EEG to evaluate for seizure Level of alertness: Awake AEDs during EEG study: None Technical aspects: This EEG study was done with scalp electrodes  positioned according to the 10-20 International system of electrode placement. Electrical activity was reviewed with band pass filter of 1-70Hz , sensitivity of 7 uV/mm, display speed of 45mm/sec with a 60Hz  notched filter applied as appropriate. EEG data were recorded continuously and digitally stored.  Video monitoring was available and reviewed as appropriate. Description: EEG showed continuous generalized predominantly 5 to 6 Hz theta slowing admixed with intermittent 2-3hz  delta slowing. Hyperventilation and photic stimulation were not performed.   ABNORMALITY - Continuous slow, generalized IMPRESSION: This study is suggestive of mild to moderate diffuse encephalopathy. No seizures or epileptiform discharges were seen throughout the recording. Charlsie Quest   MR BRAIN WO CONTRAST  Result Date: 02/15/2023 CLINICAL DATA:  Mental status change, unknown cause. EXAM: MRI HEAD WITHOUT CONTRAST TECHNIQUE: Multiplanar, multiecho pulse sequences of the brain and surrounding structures were obtained without intravenous contrast. COMPARISON:  Head CT February 15, 2023. FINDINGS: Brain: No acute infarction, hemorrhage, extra-axial collection or mass lesion. Moderate supratentorial and prominence of the cerebral sulci related to parenchymal volume loss. Minimal chronic white matter. Vascular: Normal flow voids.  Hypoplastic vertebral. Skull and upper cervical spine: Postsurgical changes in the visualized upper cervical spine. No focal identified. Sinuses/Orbits: Mild mucosal thickening of the bilateral mastoids and ethmoid cells. Bilateral lens surgery. Other: None. IMPRESSION: 1. No acute intracranial abnormality. 2. Moderate parenchymal volume loss. Electronically Signed   By: Baldemar Lenis M.D.   On: 02/15/2023 15:22   DG Chest Port 1 View  Result Date: 02/15/2023  CLINICAL DATA:  82 year old female unresponsive patient. EXAM: PORTABLE CHEST 1 VIEW COMPARISON:  Chest x-ray 12/10/2022. FINDINGS:  Lung volumes are low. No consolidative airspace disease. No pleural effusions. No pneumothorax. No evidence of pulmonary edema. Heart size is normal. Upper mediastinal contours are within normal limits. Status post median sternotomy for CABG. Atherosclerotic calcifications are noted in the thoracic aorta. IMPRESSION: 1. Low lung volumes without radiographic evidence of acute cardiopulmonary disease. 2. Aortic atherosclerosis. Electronically Signed   By: Trudie Reed M.D.   On: 02/15/2023 07:33   CT HEAD WO CONTRAST  Result Date: 02/15/2023 CLINICAL DATA:  Lethargy.  Left-sided facial droop and weakness. EXAM: CT HEAD WITHOUT CONTRAST CT CERVICAL SPINE WITHOUT CONTRAST TECHNIQUE: Multidetector CT imaging of the head and cervical spine was performed following the standard protocol without intravenous contrast. Multiplanar CT image reconstructions of the cervical spine were also generated. RADIATION DOSE REDUCTION: This exam was performed according to the departmental dose-optimization program which includes automated exposure control, adjustment of the mA and/or kV according to patient size and/or use of iterative reconstruction technique. COMPARISON:  08/02/2022 FINDINGS: CT HEAD FINDINGS Brain: No evidence of acute infarction, hemorrhage, hydrocephalus, extra-axial collection or mass lesion/mass effect. Generalized brain atrophy in keeping with age Vascular: No hyperdense vessel or unexpected calcification. Skull: Normal. Negative for fracture or focal lesion. Sinuses/Orbits: No acute finding. CT CERVICAL SPINE FINDINGS Alignment: Normal. Skull base and vertebrae: No acute fracture. No primary bone lesion or focal pathologic process. C3-C5 ACDF with solid arthrodesis Soft tissues and spinal canal: No prevertebral fluid or swelling. No visible canal hematoma. Disc levels: Disc collapse and endplate spurring especially at C5-6 to C6-7. 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 Upper chest: No  visible injury IMPRESSION: No acute finding in the head or cervical spine. No specific cause for symptoms. Electronically Signed   By: Tiburcio Pea M.D.   On: 02/15/2023 06:57   CT CERVICAL SPINE WO CONTRAST  Result Date: 02/15/2023 CLINICAL DATA:  Lethargy.  Left-sided facial droop and weakness. EXAM: CT HEAD WITHOUT CONTRAST CT CERVICAL SPINE WITHOUT CONTRAST TECHNIQUE: Multidetector CT imaging of the head and cervical spine was performed following the standard protocol without intravenous contrast. Multiplanar CT image reconstructions of the cervical spine were also generated. RADIATION DOSE REDUCTION: This exam was performed according to the departmental dose-optimization program which includes automated exposure control, adjustment of the mA and/or kV according to patient size and/or use of iterative reconstruction technique. COMPARISON:  08/02/2022 FINDINGS: CT HEAD FINDINGS Brain: No evidence of acute infarction, hemorrhage, hydrocephalus, extra-axial collection or mass lesion/mass effect. Generalized brain atrophy in keeping with age Vascular: No hyperdense vessel or unexpected calcification. Skull: Normal. Negative for fracture or focal lesion. Sinuses/Orbits: No acute finding. CT CERVICAL SPINE FINDINGS Alignment: Normal. Skull base and vertebrae: No acute fracture. No primary bone lesion or focal pathologic process. C3-C5 ACDF with solid arthrodesis Soft tissues and spinal canal: No prevertebral fluid or swelling. No visible canal hematoma. Disc levels: Disc collapse and endplate spurring especially at C5-6 to C6-7. 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 Upper chest: No visible injury IMPRESSION: No acute finding in the head or cervical spine. No specific cause for symptoms. Electronically Signed   By: Tiburcio Pea M.D.   On: 02/15/2023 06:57    Assessment/Plan  1. Hemorrhoids, unspecified hemorrhoid type Will start hydrocortisone 2.5 % rectal cream  2. Primary  hypertension Bp much better  Cont with amlodipine,clonidine patch  3. Stage 4 chronic kidney disease (  HCC) Follow up with nephrology  4. Daytime sleepiness Pt is alert but family reported that she is sleepy since few days during the day after she comes from rehab  She is not any meds that can cause drowsiness As per nursing staff she is refusing to wear cpap machine Will check cbc, bmp TSH    Family/ staff Communication:  care plan discussed with the nursing staff  Labs/tests ordered: cbc, bmp, TSH

## 2023-03-02 NOTE — Telephone Encounter (Signed)
[facility] nursing reports stat labs. Apparently she was lethargic earlier today. Patient recently hospitalized due to hypertension, UTI, AKI. WBC 18.6> was 10.7 on discharge. BUN/creat 83/4.64> was 100/4.66 at discharge. She is refusing to go to hospital. UTI was treat with Rocephin. Advised nursing to order CXR, UA/culture. After urine collected, Advised to give Rocephin 1g IM once. Afebrile. Vitals stable. If patient condition worsens, advised to go to hospital for evaluation.

## 2023-03-02 NOTE — Progress Notes (Unsigned)
Location:   [facility] Room Number: 63-A Place of Service:  SNF (31) Provider:  Oletta Lamas   PCP: [name], MD  Patient Care Team: [name], MD as PCP - General (Internal Medicine) [name], MD as PCP - Cardiology (Cardiology) [name], MD as Consulting Physician (Cardiology) [name], MD as Consulting Physician (Rheumatology) Armbruster, [name], MD as Consulting Physician (Gastroenterology) [name], MD as Consulting Physician (Pulmonary Disease) [name], MD (Inactive) as Consulting Physician (Cardiothoracic Surgery) [name], MD as Consulting Physician (Obstetrics and Gynecology) Plotnikov, Georgina Quint, MD Colletta Maryland, RN as [facility] Care Management Roger Kill, NP as Nurse Practitioner (Nephrology)  Extended Emergency Contact Information Primary Emergency Contact: [name] Address: [address]           [place], [place] [address] [place] Home Phone: [phone] Mobile Phone: [phone] Relation: Spouse Secondary Emergency Contact: [facility] Address: [address]          [place], [place] [address] [place] Mobile Phone: [phone] Relation: Son  Code Status:  DNR Goals of care: Advanced Directive information    03/02/2023    2:16 PM  Advanced Directives  Does Patient Have a Medical Advance Directive? Yes  Type of Advance Directive Out of facility DNR (pink MOST or yellow form)  Does patient want to make changes to medical advance directive? No - Patient declined     Chief Complaint  Patient presents with   Acute Visit    Hemorrhoids.     HPI:  Pt is a 82 y.o. 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 Most likely scar. Mild subendocardial ischemia. EF 71% LOW RISK.    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 Kellie Simmering   Pulmonary emboli [facility]) 02/06/2022   01/2022 PE/DVT post-op. Lower GI bleed     Treat edema  Start Eliquis at the reduced dose due to Creat>1.5 and age >69   Rheumatoid arthritis (HCC) 2011   Dr.Truslow; fracture knees, hands and wrists -    S/P CABG  x 1 1996   CABG--LIMA-LAD for ostial LAD (not felt to be PCI amenable). EF NORMAL then; LIMA now atretic   Shortness of breath dyspnea    with exertion   Stroke (HCC) 11/2014   TIA    Urinary frequency    Past Surgical History:  Procedure Laterality Date   ABDOMINAL AORTIC ANEURYSM REPAIR  1996   Complicated by mesenteric artery stenosis and splenic artery infarction with acquired Asplenia   APPENDECTOMY     BIOPSY  07/08/2022   Procedure: BIOPSY;  Surgeon: Benancio Deeds, MD;  Location: [facility] ENDOSCOPY;  Service: Gastroenterology;;   BIOPSY  11/28/2022   Procedure: BIOPSY;   Surgeon: [name], MD;  Location[facility] ENDOSCOPY;  Service: Gastroenterology;;   [name]  07/2011   right foot   CARDIAC CATHETERIZATION  2005   (Most recent CATH) - ostial LAD lesion 20-30% (down from 90% initially). Atretic LIMA. Minimal disease the RCA and Circumflex system.   CARPAL TUNNEL RELEASE Left    CATARACT EXTRACTION Bilateral    CERVICAL SPINE SURGERY     plate 1610 Dr. Wynetta Emery   COLONOSCOPY     COLONOSCOPY WITH PROPOFOL N/A 11/28/2022   Procedure: COLONOSCOPY WITH PROPOFOL;  Surgeon: [name], MD;  Location: [facility] [place] [facility] ENDOSCOPY;  Service: Gastroenterology;  Laterality: N/A;   CORONARY ARTERY BYPASS GRAFT  1996   INCLUDED AN INTERNAL MAMMARY ARTERY TO THE LAD. EF WAS NORMAL   ESOPHAGOGASTRODUODENOSCOPY (EGD) WITH PROPOFOL N/A 07/08/2022   Procedure: ESOPHAGOGASTRODUODENOSCOPY (EGD) WITH PROPOFOL;  Surgeon: Benancio Deeds, MD;  Location: [facility] ENDOSCOPY;  Service: Gastroenterology;  Laterality: N/A;   ESOPHAGOGASTRODUODENOSCOPY (EGD) WITH PROPOFOL N/A 11/28/2022   Procedure: ESOPHAGOGASTRODUODENOSCOPY (EGD) WITH PROPOFOL;  Surgeon: [name], MD;  Location: [facility] ENDOSCOPY;  Service: Gastroenterology;  Laterality: N/A;   INGUINAL HERNIA REPAIR Right    LAPAROSCOPIC APPENDECTOMY N/A 06/28/2016   Procedure: APPENDECTOMY LAPAROSCOPIC;  Surgeon: [name], MD;  Location: [facility] ORS;  Service: General;  Laterality: N/A;   NM MYOVIEW LTD  06/2010   Fixed anteroseptal, apical and inferoapical defect with moderate size. Most likely scar. Mild subendocardial ischemia. EF 71% LOW RISK.    POLYPECTOMY  11/28/2022   Procedure: POLYPECTOMY;  Surgeon: [name], MD;  Location: [facility] ENDOSCOPY;  Service: Gastroenterology;;   SPLENECTOMY     TOTAL HIP ARTHROPLASTY Left 01/23/2022   Procedure: LEFT TOTAL HIP ARTHROPLASTY ANTERIOR APPROACH;  Surgeon: [name], MD;  Location: [facility] OR;  Service: Orthopedics;  Laterality: Left;  3-C   TRANSTHORACIC ECHOCARDIOGRAM  12/2014   [facility] Normal LV size & function. EF 55-60%,    vagina polyp     VIDEO ASSISTED THORACOSCOPY (VATS)/WEDGE RESECTION Left 03/02/2015   Procedure: VIDEO ASSISTED THORACOSCOPY (VATS), MINI THORACOTOMY, LEFT UPPER LOBE WEDGE, TAKE DOWN OF INTERNAL MAMMARY LESIONS, PLACEMENT OF ON-Q PUMP;  Surgeon: [name], MD;  Location: [facility] OR;  Service: Thoracic;  Laterality: Left;   VIDEO BRONCHOSCOPY N/A 03/02/2015   Procedure: BRONCHOSCOPY;  Surgeon: [name], MD;  Location: [facility] OR;  Service: Thoracic;  Laterality: N/A;   VIDEO BRONCHOSCOPY WITH ENDOBRONCHIAL NAVIGATION N/A 10/08/2017   Procedure: VIDEO BRONCHOSCOPY WITH ENDOBRONCHIAL NAVIGATION WITH BIOPSIES OF LEFT UPPER LOBE AND LEFT LOWER LOBE;  Surgeon: [name], MD;  Location: [facility] OR;  Service: Thoracic;  Laterality: N/A;    Allergies  Allergen Reactions   Nsaids Other (See Comments)    Stomach upset Told to avoid due to Eliquis  Aspirin Other (See Comments)    Stomach upset Told to avoid due to Eliquis   Codeine Nausea And Vomiting   Eliquis [Apixaban] Other (See Comments)    Gi upset    Nitrostat [Nitroglycerin] Other (See Comments)    Bradycardia. Drop in heart rate    Tramadol Other (See Comments)    Confusion    Allergies as of 03/02/2023       Reactions   Nsaids Other (See Comments)   Stomach upset Told to avoid due to Eliquis   Aspirin Other (See Comments)   Stomach upset Told to avoid due to Eliquis   Codeine Nausea And Vomiting   Eliquis [apixaban] Other (See Comments)   Gi upset    Nitrostat [nitroglycerin] Other (See Comments)   Bradycardia. Drop in heart rate    Tramadol Other (See Comments)   Confusion        Medication List        Accurate as of March 02, 2023  2:17 PM. If you have any questions, ask your nurse or doctor.          acetaminophen 325 MG tablet Commonly known as: TYLENOL Take 2 tablets (650 mg total) by mouth 2 (two) times daily as needed (generalized pain).    albuterol 108 (90 Base) MCG/ACT inhaler Commonly known as: VENTOLIN HFA Inhale 1-2 puffs into the lungs every 4 (four) hours as needed for wheezing or shortness of breath.   allopurinol 100 MG tablet Commonly known as: ZYLOPRIM Take 1 tablet (100 mg total) by mouth daily.   amLODipine 10 MG tablet Commonly known as: NORVASC Take 1 tablet (10 mg total) by mouth daily.   ascorbic acid 500 MG tablet Commonly known as: VITAMIN C Take 1 tablet (500 mg total) by mouth daily.   atorvastatin 80 MG tablet Commonly known as: LIPITOR Take 1 tablet (80 mg total) by mouth daily.   B-complex with vitamin C tablet Take 1 tablet by mouth daily.   bisacodyl 5 MG EC tablet Commonly known as: DULCOLAX Take 2 tablets (10 mg total) by mouth daily as needed for moderate constipation or severe constipation.   carvedilol 6.25 MG tablet Commonly known as: COREG Take 1 tablet (6.25 mg total) by mouth 2 (two) times daily with a meal.   CENTRUM ADULT PO Take 1 tablet by mouth daily.   cloNIDine 0.2 mg/24hr patch Commonly known as: CATAPRES - Dosed in mg/24 hr Place 1 patch (0.2 mg total) onto the skin once a week.   clopidogrel 75 MG tablet Commonly known as: PLAVIX Take 1 tablet (75 mg total) by mouth every other day.   Cyanocobalamin 2500 MCG Tabs Take 2,500 mcg by mouth daily.   docusate sodium 100 MG capsule Commonly known as: COLACE Take 1 capsule (100 mg total) by mouth 2 (two) times daily as needed for mild constipation.   hydrOXYzine 25 MG tablet Commonly known as: ATARAX Take 1 tablet (25 mg total) by mouth 3 (three) times daily as needed for itching.   leflunomide 20 MG tablet Commonly known as: ARAVA Take 1 tablet (20 mg total) by mouth daily.   levothyroxine 88 MCG tablet Commonly known as: SYNTHROID Take 1 tablet (88 mcg total) by mouth daily before breakfast.   melatonin 3 MG Tabs tablet Take 1 tablet (3 mg total) by mouth at bedtime as needed.   pantoprazole 40 MG  tablet Commonly known as: PROTONIX Take 1 tablet (40 mg total) by mouth daily.   sertraline 25  MG tablet Commonly known as: ZOLOFT Take 25 mg by mouth daily. What changed: Another medication with the same name was removed. Continue taking this medication, and follow the directions you see here. Changed by: Venita Sheffield   triamcinolone 55 MCG/ACT Aero nasal inhaler Commonly known as: NASACORT Place 1 spray into the nose daily.   Vitamin D3 50 MCG (2000 UT) capsule Take 1 capsule (2,000 Units total) by mouth daily.        Review of Systems  Immunization History  Administered Date(s) Administered   Fluad Quad(high Dose 65+) 02/03/2021, 01/16/2022   Fluad Trivalent(High Dose 65+) 02/10/2023   Influenza Whole 05/02/2001, 01/27/2008, 01/12/2010, 12/31/2010, 01/25/2012   Influenza, High Dose Seasonal PF 01/14/2014, 02/13/2015, 12/21/2016, 01/09/2018, 12/29/2018   Influenza,inj,Quad PF,6+ Mos 12/23/2015   Influenza-Unspecified 02/03/2013, 01/13/2014, 02/13/2015, 01/20/2020   Meningococcal polysaccharide vaccine (MPSV4) 03/21/2012   Moderna Covid-19 Fall Seasonal Vaccine 23yrs & older 06/09/2022   Moderna SARS-COV2 Booster Vaccination 03/15/2020   Moderna Sars-Covid-2 Vaccination 06/02/2019, 06/30/2019   PNEUMOCOCCAL CONJUGATE-20 02/10/2023   PPD Test 11/18/2013   Pfizer Covid-19 Vaccine Bivalent Booster 80yrs & up 01/19/2021, 10/12/2021   Pneumococcal Conjugate-13 03/20/2013   Pneumococcal Polysaccharide-23 05/02/2000, 06/23/2010, 02/26/2018   Td 05/01/2006   Tdap 06/01/2016   Zoster Recombinant(Shingrix) 05/16/2017, 07/20/2017   Zoster, Live 01/17/2006   Pertinent  Health Maintenance Due  Topic Date Due   INFLUENZA VACCINE  Completed   DEXA SCAN  Completed   Colonoscopy  Discontinued      06/13/2022   10:42 PM 08/07/2022   11:33 AM 09/05/2022   11:02 AM 01/26/2023    1:48 PM 02/08/2023    2:33 PM  Fall Risk  Falls in the past year? 1 1 1  0 1  Was there an injury  with Fall? 0 1 1 0 1  Fall Risk Category Calculator 1 3 2  0 3  Patient at Risk for Falls Due to  History of fall(s) Impaired balance/gait No Fall Risks History of fall(s)  Patient at Risk for Falls Due to - Comments    use a walker   Fall risk Follow up  Falls evaluation completed Falls evaluation completed Falls evaluation completed Falls evaluation completed   Functional Status Survey:    Vitals:   03/02/23 1359  BP: (!) 150/60  Pulse: 88  Resp: 18  Temp: (!) 97.2 F (36.2 C)  SpO2: 91%  Weight: 124 lb 3.2 oz (56.3 kg)  Height: 5\' 3"  (1.6 m)   Body mass index is 22 kg/m. Physical Exam  Labs reviewed: Recent Labs    07/24/22 1009 07/25/22 0533 08/01/22 0800 02/16/23 0356 02/18/23 0413 02/24/23 0435 02/25/23 0448 02/26/23 0416  NA 138 139   < > 136   < > 128* 127* 129*  K 4.3 4.0   < > 3.8   < > 4.9 4.6 4.3  CL 103 104   < > 98   < > 95* 97* 96*  CO2 21* 21*   < > 19*   < > 19* 18* 19*  GLUCOSE 132* 91   < > 110*   < > 103* 94 81  BUN 65* 62*   < > 77*   < > 106* 99* 100*  CREATININE 3.84* 3.71*   < > 4.28*   < > 4.99* 4.57* 4.66*  CALCIUM 9.3 9.6   < > 9.7   < > 9.9 9.7 10.2  MG  --  1.9  --  2.2   < >  2.2 2.2 2.2  PHOS 4.9* 5.4*  --  5.7*  --   --   --   --    < > = values in this interval not displayed.   Recent Labs    10/04/22 1300 12/07/22 2140 02/15/23 0645  AST 25 29 24   ALT 18 17 17   ALKPHOS 73 85 81  BILITOT 0.5 1.2 0.9  PROT 5.8* 6.1* 7.0  ALBUMIN 3.0* 3.2* 3.6   Recent Labs    09/30/22 1849 10/11/22 1325 11/27/22 0105 11/27/22 0506 02/15/23 0551 02/15/23 0635 02/24/23 0435 02/25/23 0448 02/26/23 0416  WBC 9.8  --  8.5   < > 9.4   < > 9.2 10.9* 10.7*  NEUTROABS 6.3  --  4.4  --  7.9*  --   --   --   --   HGB 10.4*   < > 10.0*   < > 12.8   < > 11.3* 10.6* 10.3*  HCT 32.9*  --  32.0*   < > 39.6   < > 33.5* 31.5* 31.5*  MCV 97.9  --  101.3*   < > 100.8*   < > 97.4 95.7 96.6  PLT 343  --  227   < > 329   < > 363 361 366   < > = values  in this interval not displayed.   Lab Results  Component Value Date   TSH 2.829 02/15/2023   No results found for: "HGBA1C" Lab Results  Component Value Date   CHOL 157 09/07/2020   HDL 59.30 09/07/2020   LDLCALC 74 09/07/2020   LDLDIRECT 162.1 10/28/2009   TRIG 163 (H) 02/16/2023   CHOLHDL 3 09/07/2020    Significant Diagnostic Results in last 30 days:  CT ABDOMEN PELVIS WO CONTRAST  Result Date: 02/24/2023 CLINICAL DATA:  Abdominal pain, acute, nonlocalized. Stomach distention. EXAM: CT ABDOMEN AND PELVIS WITHOUT CONTRAST TECHNIQUE: Multidetector CT imaging of the abdomen and pelvis was performed following the standard protocol without IV contrast. RADIATION DOSE REDUCTION: This exam was performed according to the departmental dose-optimization program which includes automated exposure control, adjustment of the mA and/or kV according to patient size and/or use of iterative reconstruction technique. COMPARISON:  01/22/2012. FINDINGS: Lower chest: The heart is enlarged. There is a trace left pleural effusion. Atelectasis or scarring is noted at the lung bases bilaterally. Hepatobiliary: The left lobe of the liver has a nodular contour, suggesting underlying cirrhosis. No focal abnormality is seen in the liver. No biliary ductal dilatation. Stones are present within the gallbladder. Pancreas: Unremarkable. No pancreatic ductal dilatation or surrounding inflammatory changes. Spleen: Surgically absent. Adrenals/Urinary Tract: Adrenal glands are within normal limits. Cysts are present in the kidneys bilaterally. No renal calculus or hydronephrosis bilaterally. The visualized portion of the urinary bladder is within normal limits. Stomach/Bowel: There is a small hiatal hernia. Stomach is within normal limits. Appendix is not seen. There is diffuse rectal wall thickening with surrounding fat stranding. Scattered diverticula are present along the colon without evidence of diverticulitis. No free air  or pneumatosis. Vascular/Lymphatic: Aortic atherosclerosis. No enlarged abdominal or pelvic lymph nodes. Reproductive: Uterus and bilateral adnexa are unremarkable. Other: No abdominopelvic ascites. Musculoskeletal: Degenerative changes are present in the thoracolumbar spine. Total hip arthroplasty changes are present on the left. No acute osseous abnormality. IMPRESSION: 1. Marked rectal wall thickening with surrounding fat stranding suggesting proctitis. 2. Trace left pleural effusion with atelectasis at the lung bases. 3. Small hiatal hernia. 4. Diverticulosis without diverticulitis. 5.  Nodular contour of the left lobe of the liver suggesting cirrhosis. 6. Cholelithiasis. 7. Aortic atherosclerosis Electronically Signed   By: Thornell Sartorius M.D.   On: 02/24/2023 21:53   DG Abd 1 View  Result Date: 02/23/2023 CLINICAL DATA:  Acute abdominal pain EXAM: ABDOMEN - 1 VIEW COMPARISON:  Abdominal x-ray 09/21/2022 FINDINGS: There is gaseous distention of the stomach. Air seen throughout nondilated colon and small bowel to the level the rectum. There are no suspicious calcifications. There is dextroconvex curvature of the lumbar spine with degenerative change. Left hip arthroplasty is present. IMPRESSION: Gaseous distention of the stomach. Nonobstructive bowel gas pattern. Electronically Signed   By: Darliss Cheney M.D.   On: 02/23/2023 18:12   CT KNEE RIGHT WO CONTRAST  Result Date: 02/19/2023 CLINICAL DATA:  Knee pain EXAM: CT OF THE RIGHT KNEE WITHOUT CONTRAST TECHNIQUE: Multidetector CT imaging of the right knee was performed according to the standard protocol. Multiplanar CT image reconstructions were also generated. RADIATION DOSE REDUCTION: This exam was performed according to the departmental dose-optimization program which includes automated exposure control, adjustment of the mA and/or kV according to patient size and/or use of iterative reconstruction technique. COMPARISON:  02/18/2023 radiographs  FINDINGS: Bones/Joint/Cartilage Meniscal chondrocalcinosis. Large knee joint effusion. Cortical thickening posterolaterally in the lateral tibial plateau and posterolaterally along the lateral femoral condyle, possibly degenerative although subcortical stress fractures are not excluded. There is some widening of the lateral compartment posteriorly raise the possibility of a prior compression of the lateral tibial plateau but no well-defined acute cortical fracture is identified. Tricompartmental marginal spurring noted. Ligaments Suboptimally assessed by CT. Muscles and Tendons Unremarkable Soft tissues Moderate-sized Baker's cyst. Distal SFA and popliteal artery atheromatous vascular calcification. Subcutaneous edema anterior to the junction of the quadriceps tendon and lateral patellar retinaculum. IMPRESSION: 1. Large knee joint effusion. 2. Cortical thickening posterolaterally in the lateral tibial plateau and posterolaterally along the lateral femoral condyle, possibly degenerative although subcortical stress fractures are not excluded. There is some widening of the lateral compartment posteriorly raise the possibility of a prior compression of the lateral tibial plateau but no well-defined acute cortical fracture is identified. 3. 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 4. Moderate-sized Baker's cyst. 5. Subcutaneous edema anterior to the junction of the quadriceps tendon and lateral patellar retinaculum. 6. Distal SFA and popliteal artery atheromatous vascular calcification. Electronically Signed   By: Gaylyn Rong M.D.   On: 02/19/2023 13:33   DG Knee 1-2 Views Right  Result Date: 02/18/2023 CLINICAL DATA:  Right knee pain and swelling following fall, initial encounter EXAM: RIGHT KNEE - 2 VIEW COMPARISON:  08/02/2022 FINDINGS: Tricompartmental degenerative changes are noted. There is suggestion of mildly displaced fracture through the intercondylar eminence of the  proximal tibia best seen on the lateral projection. Cross-sectional imaging may be helpful for further evaluation. Large suprapatellar joint effusion is seen. IMPRESSION: Large joint effusion with findings suspicious for proximal tibial fracture. CT may be helpful when clinically able. Electronically Signed   By: Alcide Clever M.D.   On: 02/18/2023 22:12   US RENAL  Result Date: 02/18/2023 CLINICAL DATA:  Acute kidney injury. EXAM: RENAL / URINARY TRACT ULTRASOUND COMPLETE COMPARISON:  CT 12/08/2022, renal ultrasound 07/11/2022 FINDINGS: Right Kidney: Renal measurements: 8.1 x 4 x 3.3 cm = volume: 55 mL. Renal parenchymal atrophy and increased echogenicity. Simple cyst measures 3.6 cm. Additional smaller cysts. No hydronephrosis. Left Kidney: Renal measurements: 7.2 x 3.3 x 3.6 cm = volume: 44 mL. Renal parenchymal atrophy  and increased echogenicity. There are multiple simple cysts, which were better appreciated on prior CT. No hydronephrosis. Bladder: Only minimally distended. Appears normal for degree of bladder distention. Other: None. IMPRESSION: 1. No obstructive uropathy. 2. Renal parenchymal atrophy and findings of chronic medical renal disease. Electronically Signed   By: Narda Rutherford M.D.   On: 02/18/2023 14:02   EEG adult  Result Date: 02/15/2023 Charlsie Quest, MD     02/15/2023  4:07 PM Patient Name: ETHELENE CLOSSER MRN: [id number] Epilepsy Attending: Charlsie Quest Referring Physician/Provider: Lynnell Catalan, MD Date: 02/15/2023 Duration: 22.26 mins Patient history: 82 yo F with acutely altered mental status with associated hypertensive crisis, but without visible seizure activity and only left facial droop as a consistent focal deficit. EEG to evaluate for seizure Level of alertness: Awake AEDs during EEG study: None Technical aspects: This EEG study was done with scalp electrodes positioned according to the 10-20 International system of electrode placement. Electrical activity was  reviewed with band pass filter of 1-70Hz , sensitivity of 7 uV/mm, display speed of 40mm/sec with a 60Hz  notched filter applied as appropriate. EEG data were recorded continuously and digitally stored.  Video monitoring was available and reviewed as appropriate. Description: EEG showed continuous generalized predominantly 5 to 6 Hz theta slowing admixed with intermittent 2-3hz  delta slowing. Hyperventilation and photic stimulation were not performed.   ABNORMALITY - Continuous slow, generalized IMPRESSION: This study is suggestive of mild to moderate diffuse encephalopathy. No seizures or epileptiform discharges were seen throughout the recording. Charlsie Quest   MR BRAIN WO CONTRAST  Result Date: 02/15/2023 CLINICAL DATA:  Mental status change, unknown cause. EXAM: MRI HEAD WITHOUT CONTRAST TECHNIQUE: Multiplanar, multiecho pulse sequences of the brain and surrounding structures were obtained without intravenous contrast. COMPARISON:  Head CT February 15, 2023. FINDINGS: Brain: No acute infarction, hemorrhage, extra-axial collection or mass lesion. Moderate supratentorial and prominence of the cerebral sulci related to parenchymal volume loss. Minimal chronic white matter. Vascular: Normal flow voids.  Hypoplastic vertebral. Skull and upper cervical spine: Postsurgical changes in the visualized upper cervical spine. No focal identified. Sinuses/Orbits: Mild mucosal thickening of the bilateral mastoids and ethmoid cells. Bilateral lens surgery. Other: None. IMPRESSION: 1. No acute intracranial abnormality. 2. Moderate parenchymal volume loss. Electronically Signed   By: Baldemar Lenis M.D.   On: 02/15/2023 15:22   DG Chest Port 1 View  Result Date: 02/15/2023 CLINICAL DATA:  82 year old female unresponsive patient. EXAM: PORTABLE CHEST 1 VIEW COMPARISON:  Chest x-ray 12/10/2022. FINDINGS: Lung volumes are low. No consolidative airspace disease. No pleural effusions. No pneumothorax. No  evidence of pulmonary edema. Heart size is normal. Upper mediastinal contours are within normal limits. Status post median sternotomy for CABG. Atherosclerotic calcifications are noted in the thoracic aorta. IMPRESSION: 1. Low lung volumes without radiographic evidence of acute cardiopulmonary disease. 2. Aortic atherosclerosis. Electronically Signed   By: Trudie Reed M.D.   On: 02/15/2023 07:33   CT HEAD WO CONTRAST  Result Date: 02/15/2023 CLINICAL DATA:  Lethargy.  Left-sided facial droop and weakness. EXAM: CT HEAD WITHOUT CONTRAST CT CERVICAL SPINE WITHOUT CONTRAST TECHNIQUE: Multidetector CT imaging of the head and cervical spine was performed following the standard protocol without intravenous contrast. Multiplanar CT image reconstructions of the cervical spine were also generated. RADIATION DOSE REDUCTION: This exam was performed according to the departmental dose-optimization program which includes automated exposure control, adjustment of the mA and/or kV according to patient size and/or use of iterative reconstruction  technique. COMPARISON:  08/02/2022 FINDINGS: CT HEAD FINDINGS Brain: No evidence of acute infarction, hemorrhage, hydrocephalus, extra-axial collection or mass lesion/mass effect. Generalized brain atrophy in keeping with age Vascular: No hyperdense vessel or unexpected calcification. Skull: Normal. Negative for fracture or focal lesion. Sinuses/Orbits: No acute finding. CT CERVICAL SPINE FINDINGS Alignment: Normal. Skull base and vertebrae: No acute fracture. No primary bone lesion or focal pathologic process. C3-C5 ACDF with solid arthrodesis Soft tissues and spinal canal: No prevertebral fluid or swelling. No visible canal hematoma. Disc levels: Disc collapse and endplate spurring especially at C5-6 to C6-7. 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 Upper chest: No visible injury IMPRESSION: No acute finding in the head or cervical spine. No specific cause for  symptoms. Electronically Signed   By: Tiburcio Pea M.D.   On: 02/15/2023 06:57   CT CERVICAL SPINE WO CONTRAST  Result Date: 02/15/2023 CLINICAL DATA:  Lethargy.  Left-sided facial droop and weakness. EXAM: CT HEAD WITHOUT CONTRAST CT CERVICAL SPINE WITHOUT CONTRAST TECHNIQUE: Multidetector CT imaging of the head and cervical spine was performed following the standard protocol without intravenous contrast. Multiplanar CT image reconstructions of the cervical spine were also generated. RADIATION DOSE REDUCTION: This exam was performed according to the departmental dose-optimization program which includes automated exposure control, adjustment of the mA and/or kV according to patient size and/or use of iterative reconstruction technique. COMPARISON:  08/02/2022 FINDINGS: CT HEAD FINDINGS Brain: No evidence of acute infarction, hemorrhage, hydrocephalus, extra-axial collection or mass lesion/mass effect. Generalized brain atrophy in keeping with age Vascular: No hyperdense vessel or unexpected calcification. Skull: Normal. Negative for fracture or focal lesion. Sinuses/Orbits: No acute finding. CT CERVICAL SPINE FINDINGS Alignment: Normal. Skull base and vertebrae: No acute fracture. No primary bone lesion or focal pathologic process. C3-C5 ACDF with solid arthrodesis Soft tissues and spinal canal: No prevertebral fluid or swelling. No visible canal hematoma. Disc levels: Disc collapse and endplate spurring especially at C5-6 to C6-7. 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 Upper chest: No visible injury IMPRESSION: No acute finding in the head or cervical spine. No specific cause for symptoms. Electronically Signed   By: Tiburcio Pea M.D.   On: 02/15/2023 06:57    Assessment/Plan There are no diagnoses linked to this encounter.   Family/ staff Communication:   Labs/tests ordered:

## 2023-03-05 ENCOUNTER — Encounter: Payer: Self-pay | Admitting: Sports Medicine

## 2023-03-05 ENCOUNTER — Non-Acute Institutional Stay (SKILLED_NURSING_FACILITY): Payer: Self-pay | Admitting: Nurse Practitioner

## 2023-03-05 ENCOUNTER — Encounter: Payer: Self-pay | Admitting: Nurse Practitioner

## 2023-03-05 DIAGNOSIS — N184 Chronic kidney disease, stage 4 (severe): Secondary | ICD-10-CM

## 2023-03-05 DIAGNOSIS — K922 Gastrointestinal hemorrhage, unspecified: Secondary | ICD-10-CM | POA: Diagnosis not present

## 2023-03-05 DIAGNOSIS — K649 Unspecified hemorrhoids: Secondary | ICD-10-CM | POA: Diagnosis not present

## 2023-03-05 DIAGNOSIS — D72829 Elevated white blood cell count, unspecified: Secondary | ICD-10-CM | POA: Diagnosis not present

## 2023-03-05 DIAGNOSIS — K219 Gastro-esophageal reflux disease without esophagitis: Secondary | ICD-10-CM

## 2023-03-05 DIAGNOSIS — M069 Rheumatoid arthritis, unspecified: Secondary | ICD-10-CM

## 2023-03-05 DIAGNOSIS — J189 Pneumonia, unspecified organism: Secondary | ICD-10-CM

## 2023-03-05 DIAGNOSIS — E039 Hypothyroidism, unspecified: Secondary | ICD-10-CM

## 2023-03-05 DIAGNOSIS — E872 Acidosis, unspecified: Secondary | ICD-10-CM | POA: Diagnosis not present

## 2023-03-05 DIAGNOSIS — Z9889 Other specified postprocedural states: Secondary | ICD-10-CM | POA: Diagnosis not present

## 2023-03-05 DIAGNOSIS — I2510 Atherosclerotic heart disease of native coronary artery without angina pectoris: Secondary | ICD-10-CM | POA: Diagnosis not present

## 2023-03-05 DIAGNOSIS — I10 Essential (primary) hypertension: Secondary | ICD-10-CM | POA: Diagnosis not present

## 2023-03-05 DIAGNOSIS — I120 Hypertensive chronic kidney disease with stage 5 chronic kidney disease or end stage renal disease: Secondary | ICD-10-CM | POA: Diagnosis not present

## 2023-03-05 DIAGNOSIS — E871 Hypo-osmolality and hyponatremia: Secondary | ICD-10-CM | POA: Diagnosis not present

## 2023-03-05 DIAGNOSIS — N185 Chronic kidney disease, stage 5: Secondary | ICD-10-CM | POA: Diagnosis not present

## 2023-03-05 DIAGNOSIS — D631 Anemia in chronic kidney disease: Secondary | ICD-10-CM | POA: Diagnosis not present

## 2023-03-05 DIAGNOSIS — G3184 Mild cognitive impairment, so stated: Secondary | ICD-10-CM

## 2023-03-05 NOTE — Progress Notes (Signed)
error 

## 2023-03-05 NOTE — Progress Notes (Unsigned)
Location:   [facility] Home Room Number: 063-A Place of Service:  SNF (31) Provider: Chipper Oman NP  Patient Care Team: [name], MD as PCP - General (Internal Medicine) [name], MD as PCP - Cardiology (Cardiology) [name], MD as Consulting Physician (Cardiology) [name], MD as Consulting Physician (Rheumatology) Armbruster, [name], MD as Consulting Physician (Gastroenterology) [name], MD as Consulting Physician (Pulmonary Disease) [name], MD (Inactive) as Consulting Physician (Cardiothoracic Surgery) [name], MD as Consulting Physician (Obstetrics and Gynecology) Plotnikov, Georgina Quint, MD Colletta Maryland, RN as [facility] Care Management Roger Kill, NP as Nurse Practitioner (Nephrology)  Extended Emergency Contact Information Primary Emergency Contact: [name] Address: [address]           [place], [place] [address] [place] Home Phone: [phone] Mobile Phone: [phone] Relation: Spouse Secondary Emergency Contact: [facility] Address: [address]          [place], [place] [address] [place] Mobile Phone: [phone] Relation: Son  Code Status: DNR Goals of Care: Advanced Directive information    03/05/2023   11:13 AM  Advanced Directives  Does Patient Have a Medical Advance Directive? Yes  Type of Advance Directive Out of facility DNR (pink MOST or yellow form)  Does patient want to make changes to medical advance directive? No - Patient declined  Pre-existing out of facility DNR order (yellow form or pink MOST form) Pink MOST/Yellow Form most recent copy in chart - Physician notified to receive inpatient order     Chief Complaint  Patient presents with   Acute Visit    Leukocytosis    HPI: Patient is a 82 y.o. female seen in today for elevated wbc 18.6, CXR small right base infiltrate, Doxycycline  started subsequently. The patient is afebrile, no O2 desaturation,  denied chest pain, increased SOB, cough, dysuria, or abd pain/discomfort. Her sleep, appetite, and tolerance to therapy at her baseline today.    Hospitalized 02/15/23-02/26/23 left facial and arm weakness, underwent neurology evaluation, MRI brain negative. Left facial weakness likely from left facial shingles, resolved.              HTN labile, goal Sbp 150s, on Amlodipine 10mg  every day, Coreg bid, Clonidine 0.2mg  patch, off Losartan and Torsemide.              CKD, f/u nephrology, creat 4.64 03/02/23, peak at 5.1, negative renal US             Post herpetic neurologia left face, treated with Veltrex. On Tylenol             Pseudogout/gout R knee, s/p arthrocentesis,  on Allopurinol             Hyponatremia, Na 131 03/02/23             Constipation, prn Bisacodyl, Colace             CAD, s/p CABG             Hx of DVT/PE failed Eliquis 2/2 GI bleed, on Plavix presently.              COPD, prn Albuterol,              RA, on Arava             Hypothyroidism, on Levothyroxine, TSH 2.829 02/15/23<<6.01 03/02/23  GERD, taking Pantoprazole. Hgb 10.1 03/02/23             HLD, on Atorvastatin    02/08/2023    2:33 PM 01/22/2023    1:59 PM 01/15/2023   10:07 AM 12/12/2022    4:42 PM 10/30/2022    9:19 AM  Depression screen PHQ 2/9  Decreased Interest 0 1 1 1 1   Down, Depressed, Hopeless 0 1 0 1 1  PHQ - 2 Score 0 2 1 2 2   Altered sleeping 0 1  1 1   Tired, decreased energy 0 1  1 1   Change in appetite 0 1  1 1   Feeling bad or failure about yourself  0 1  1 1   Trouble concentrating 0 0  1 1  Moving slowly or fidgety/restless 0 1  1 1   Suicidal thoughts 0 0  0 0  PHQ-9 Score 0 7  8 8   Difficult doing work/chores Not difficult at all Somewhat difficult  Somewhat difficult Somewhat difficult       06/13/2022   10:42 PM 08/07/2022   11:33 AM 09/05/2022   11:02 AM 01/26/2023    1:48 PM 02/08/2023    2:33 PM  Fall Risk   Falls in the past year? 1 1 1  0 1  Was there an injury with Fall? 0 1 1 0 1  Fall Risk Category Calculator 1 3 2  0 3  Patient at Risk for Falls Due to  History of fall(s) Impaired balance/gait No Fall Risks History of fall(s)  Patient at Risk for Falls Due to - Comments    use a walker   Fall risk Follow up  Falls evaluation completed Falls evaluation completed Falls evaluation completed Falls evaluation completed      12/21/2016    9:30 AM 11/26/2014    9:22 AM  MMSE - Mini Mental State Exam  Not completed:  Unable to complete  Orientation to time 5   Orientation to Place 5   Registration 3   Attention/ Calculation 5   Recall 2   Language- name 2 objects 2   Language- repeat 1   Language- follow 3 step command 3   Language- read & follow direction 1   Write a sentence 1   Copy design 1   Total score 29      Health Maintenance  Topic Date Due   Fecal DNA (Cologuard)  Never done   COVID-19 Vaccine (6 - 2023-24 season) 12/31/2022   Medicare Annual Wellness (AWV)  06/16/2023   DTaP/Tdap/Td (3 - Td or Tdap) 06/01/2026   Pneumonia Vaccine 37+ Years old  Completed   INFLUENZA VACCINE  Completed   DEXA SCAN  Completed   Zoster Vaccines- Shingrix  Completed   HPV VACCINES  Aged Out   Colonoscopy  Discontinued    Urinary incontinence? Functional Status Survey:   Exercise? Diet? No results found. 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 Most likely scar. Mild subendocardial ischemia. EF 71% LOW RISK.    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 Kellie Simmering   Pulmonary emboli [facility]) 02/06/2022   01/2022 PE/DVT post-op. Lower GI bleed     Treat edema  Start Eliquis at the reduced dose due to Creat>1.5 and age >76   Rheumatoid arthritis (HCC) 2011   Dr.Truslow; fracture knees, hands and wrists -    S/P CABG x 1 1996   CABG--LIMA-LAD for ostial LAD (not felt to be PCI amenable). EF NORMAL then; LIMA now atretic   Shortness of breath dyspnea    with exertion   Stroke (HCC) 11/2014   TIA    Urinary frequency     Past Surgical History:  Procedure Laterality Date   ABDOMINAL AORTIC ANEURYSM REPAIR  1996   Complicated by mesenteric artery stenosis and splenic artery infarction with acquired Asplenia   APPENDECTOMY     BIOPSY  07/08/2022   Procedure: BIOPSY;  Surgeon: Benancio Deeds, MD;  Location: [facility] ENDOSCOPY;  Service: Gastroenterology;;   BIOPSY  11/28/2022   Procedure: BIOPSY;  Surgeon: [name], MD;  Location: [facility] ENDOSCOPY;  Service: Gastroenterology;;   [name]  07/2011   right foot   CARDIAC CATHETERIZATION  2005   (Most recent CATH) - ostial LAD lesion 20-30% (down from 90% initially). Atretic LIMA. Minimal disease the RCA and Circumflex system.   CARPAL TUNNEL RELEASE Left    CATARACT EXTRACTION Bilateral    CERVICAL SPINE SURGERY     plate  1610 Dr. Wynetta Emery   COLONOSCOPY     COLONOSCOPY WITH PROPOFOL N/A 11/28/2022   Procedure: COLONOSCOPY WITH PROPOFOL;  Surgeon: [name], MD;  Location: [facility] ENDOSCOPY;  Service: Gastroenterology;  Laterality: N/A;   CORONARY ARTERY BYPASS GRAFT  1996   INCLUDED AN INTERNAL MAMMARY ARTERY TO THE LAD. EF WAS NORMAL   ESOPHAGOGASTRODUODENOSCOPY (EGD) WITH PROPOFOL N/A 07/08/2022   Procedure: ESOPHAGOGASTRODUODENOSCOPY (EGD) WITH PROPOFOL;  Surgeon: Benancio Deeds, MD;  Location: [facility] ENDOSCOPY;  Service: Gastroenterology;  Laterality: N/A;   ESOPHAGOGASTRODUODENOSCOPY (EGD) WITH PROPOFOL N/A 11/28/2022   Procedure: ESOPHAGOGASTRODUODENOSCOPY (EGD) WITH PROPOFOL;  Surgeon: [name], MD;  Location: [facility] ENDOSCOPY;  Service: Gastroenterology;  Laterality: N/A;   INGUINAL HERNIA REPAIR Right    LAPAROSCOPIC APPENDECTOMY N/A 06/28/2016   Procedure: APPENDECTOMY LAPAROSCOPIC;  Surgeon: [name], MD;  Location: [facility] ORS;  Service: General;  Laterality: N/A;   NM MYOVIEW LTD  06/2010   Fixed anteroseptal, apical and inferoapical defect with moderate size. Most likely scar. Mild subendocardial ischemia. EF 71% LOW RISK.    POLYPECTOMY  11/28/2022   Procedure: POLYPECTOMY;  Surgeon: [name], MD;  Location: [facility] ENDOSCOPY;  Service: Gastroenterology;;   SPLENECTOMY  TOTAL HIP ARTHROPLASTY Left 01/23/2022   Procedure: LEFT TOTAL HIP ARTHROPLASTY ANTERIOR APPROACH;  Surgeon: [name], MD;  Location: [facility] OR;  Service: Orthopedics;  Laterality: Left;  3-C   TRANSTHORACIC ECHOCARDIOGRAM  12/2014   [facility]: Normal LV size & function. EF 55-60%,    vagina polyp     VIDEO ASSISTED THORACOSCOPY (VATS)/WEDGE RESECTION Left 03/02/2015   Procedure: VIDEO ASSISTED THORACOSCOPY (VATS), MINI THORACOTOMY, LEFT UPPER LOBE WEDGE, TAKE DOWN OF INTERNAL MAMMARY LESIONS, PLACEMENT OF ON-Q PUMP;  Surgeon: [name], MD;  Location: [facility] OR;  Service: Thoracic;  Laterality: Left;   VIDEO  BRONCHOSCOPY N/A 03/02/2015   Procedure: BRONCHOSCOPY;  Surgeon: [name], MD;  Location: [facility] OR;  Service: Thoracic;  Laterality: N/A;   VIDEO BRONCHOSCOPY WITH ENDOBRONCHIAL NAVIGATION N/A 10/08/2017   Procedure: VIDEO BRONCHOSCOPY WITH ENDOBRONCHIAL NAVIGATION WITH BIOPSIES OF LEFT UPPER LOBE AND LEFT LOWER LOBE;  Surgeon: [name], MD;  Location: [facility] OR;  Service: Thoracic;  Laterality: N/A;    The patient has a family history of  shoulder Social History   Socioeconomic History   Marital status: Married    Spouse name: Not on file   Number of children: 4   Years of education: Not on file   Highest education level: Bachelor's degree (e.g., BA, AB, BS)  Occupational History   Occupation: retired    Associate Professor: RETIRED  Tobacco Use   Smoking status: Former    Current packs/day: 0.00    Types: Cigarettes    Quit date: 12/04/1958    Years since quitting: 64.2    Passive exposure: Never   Smokeless tobacco: Never  Vaping Use   Vaping status: Never Used  Substance and Sexual Activity   Alcohol use: Yes    Comment: hardly ever   Drug use: No   Sexual activity: Not Currently  Other Topics Concern   Not on file  Social History Narrative   [facility]  MARRIED -RETIRED ; 4 CHILDREN,2 CHILDREN.    [facility] since dec [facility]   Social Determinants of Health   Financial Resource Strain: Low Risk  (09/05/2022)   Overall Financial Resource Strain (CARDIA)    Difficulty of Paying Living Expenses: Not hard at all  Food Insecurity: No Food Insecurity (02/17/2023)   Hunger Vital Sign    Worried About Running Out of Food in the Last Year: Never true    Ran Out of Food in the Last Year: Never true  Transportation Needs: No Transportation Needs (02/17/2023)   PRAPARE - Administrator, Civil Service (Medical): No    Lack of Transportation (Non-Medical): No  Physical Activity: Insufficiently Active (01/22/2023)   Exercise  Vital Sign    Days of Exercise per Week: 2 days    Minutes of Exercise per Session: 10 min  Stress: Stress Concern Present (01/22/2023)   [employer] of Occupational Health - Occupational Stress Questionnaire    Feeling of Stress : To some extent  Social Connections: Moderately Integrated (09/05/2022)   Social Connection and Isolation Panel [NHANES]    Frequency of Communication with Friends and Family: More than three times a week    Frequency of Social Gatherings with Friends and Family: Patient declined    Attends Religious Services: Never    Database administrator or Organizations: Yes    Attends Banker Meetings: Patient declined    Marital Status: Married  Intimate  Partner Violence: Not At Risk (02/17/2023)   Humiliation, Afraid, Rape, and Kick questionnaire    Fear of Current or Ex-Partner: No    Emotionally Abused: No    Physically Abused: No    Sexually Abused: No    Allergies  Allergen Reactions   Nsaids Other (See Comments)    Stomach upset Told to avoid due to Eliquis   Aspirin Other (See Comments)    Stomach upset Told to avoid due to Eliquis   Codeine Nausea And Vomiting   Eliquis [Apixaban] Other (See Comments)    Gi upset    Nitrostat [Nitroglycerin] Other (See Comments)    Bradycardia. Drop in heart rate    Tramadol Other (See Comments)    Confusion    Allergies as of 03/05/2023       Reactions   Nsaids Other (See Comments)   Stomach upset Told to avoid due to Eliquis   Aspirin Other (See Comments)   Stomach upset Told to avoid due to Eliquis   Codeine Nausea And Vomiting   Eliquis [apixaban] Other (See Comments)   Gi upset    Nitrostat [nitroglycerin] Other (See Comments)   Bradycardia. Drop in heart rate    Tramadol Other (See Comments)   Confusion        Medication List        Accurate as of March 05, 2023 12:13 PM. If you have any questions, ask your nurse or doctor.          acetaminophen 325 MG  tablet Commonly known as: TYLENOL Take 2 tablets (650 mg total) by mouth 2 (two) times daily as needed (generalized pain).   albuterol 108 (90 Base) MCG/ACT inhaler Commonly known as: VENTOLIN HFA Inhale 1-2 puffs into the lungs every 4 (four) hours as needed for wheezing or shortness of breath.   allopurinol 100 MG tablet Commonly known as: ZYLOPRIM Take 1 tablet (100 mg total) by mouth daily.   amLODipine 10 MG tablet Commonly known as: NORVASC Take 1 tablet (10 mg total) by mouth daily.   ascorbic acid 500 MG tablet Commonly known as: VITAMIN C Take 1 tablet (500 mg total) by mouth daily.   atorvastatin 80 MG tablet Commonly known as: LIPITOR Take 1 tablet (80 mg total) by mouth daily.   B-complex with vitamin C tablet Take 1 tablet by mouth daily.   bisacodyl 5 MG EC tablet Commonly known as: DULCOLAX Take 2 tablets (10 mg total) by mouth daily as needed for moderate constipation or severe constipation.   carvedilol 6.25 MG tablet Commonly known as: COREG Take 1 tablet (6.25 mg total) by mouth 2 (two) times daily with a meal.   CENTRUM ADULT PO Take 1 tablet by mouth daily.   cloNIDine 0.2 mg/24hr patch Commonly known as: CATAPRES - Dosed in mg/24 hr Place 1 patch (0.2 mg total) onto the skin once a week.   clopidogrel 75 MG tablet Commonly known as: PLAVIX Take 1 tablet (75 mg total) by mouth every other day.   Cyanocobalamin 2500 MCG Tabs Take 2,500 mcg by mouth daily.   docusate sodium 100 MG capsule Commonly known as: COLACE Take 1 capsule (100 mg total) by mouth 2 (two) times daily as needed for mild constipation.   hydrOXYzine 25 MG tablet Commonly known as: ATARAX Take 1 tablet (25 mg total) by mouth 3 (three) times daily as needed for itching.   leflunomide 20 MG tablet Commonly known as: ARAVA Take 1 tablet (20 mg total) by  mouth daily.   levothyroxine 88 MCG tablet Commonly known as: SYNTHROID Take 1 tablet (88 mcg total) by mouth daily  before breakfast.   melatonin 3 MG Tabs tablet Take 1 tablet (3 mg total) by mouth at bedtime as needed.   pantoprazole 40 MG tablet Commonly known as: PROTONIX Take 1 tablet (40 mg total) by mouth daily.   sertraline 25 MG tablet Commonly known as: ZOLOFT Take 25 mg by mouth daily.   triamcinolone 55 MCG/ACT Aero nasal inhaler Commonly known as: NASACORT Place 1 spray into the nose daily.   Vitamin D3 50 MCG (2000 UT) capsule Take 1 capsule (2,000 Units total) by mouth daily.         Review of Systems:  Review of Systems  Constitutional:  Positive for fatigue. Negative for appetite change and fever.  HENT:  Negative for congestion and trouble swallowing.   Eyes:  Negative for visual disturbance.  Respiratory:  Positive for cough and shortness of breath. Negative for wheezing.        DOE  Cardiovascular:  Positive for leg swelling.  Gastrointestinal:  Negative for abdominal pain, blood in stool, constipation, nausea and vomiting.  Genitourinary:  Negative for difficulty urinating, dysuria and urgency.  Musculoskeletal:  Positive for arthralgias, back pain and gait problem. Negative for joint swelling.  Skin:  Negative for color change.  Neurological:  Negative for tremors, weakness and headaches.       Left facial post herpetic neuralgia.   Psychiatric/Behavioral:  Negative for behavioral problems and sleep disturbance. The patient is not nervous/anxious.     Physical Exam: Vitals:   03/05/23 1109  BP: (!) 154/64  Pulse: 82  Resp: 20  Temp: (!) 97 F (36.1 C)  SpO2: 95%  Weight: 125 lb 9.6 oz (57 kg)  Height: 5\' 3"  (1.6 m)   Body mass index is 22.25 kg/m. Physical Exam Constitutional:      Appearance: Normal appearance.     Comments: Exhausted easily.   HENT:     Head: Normocephalic and atraumatic.     Nose:     Comments: Facial bruises, discomfort bridge of nose palpated.     Mouth/Throat:     Mouth: Mucous membranes are moist.  Eyes:      Extraocular Movements: Extraocular movements intact.     Conjunctiva/sclera: Conjunctivae normal.     Pupils: Pupils are equal, round, and reactive to light.  Cardiovascular:     Rate and Rhythm: Normal rate and regular rhythm.     Heart sounds: No murmur heard. Pulmonary:     Effort: Pulmonary effort is normal.     Breath sounds: No rales.  Abdominal:     General: Bowel sounds are normal.     Palpations: Abdomen is soft.     Tenderness: There is no abdominal tenderness.  Genitourinary:    Comments: External hemorrhoids from previous examination.  Musculoskeletal:     Cervical back: Normal range of motion and neck supple.     Right lower leg: Edema present.     Left lower leg: Edema present.     Comments: Trace edema BLE L>R  Skin:    General: Skin is warm and dry.     Comments: L hip surgical scar  Neurological:     General: No focal deficit present.     Mental Status: She is alert and oriented to person, place, and time. Mental status is at baseline.     Motor: No weakness.  Gait: Gait abnormal.     Comments: Left facial post herpetic neuralgia.   Psychiatric:        Mood and Affect: Mood normal.        Behavior: Behavior normal.        Thought Content: Thought content normal.     Labs reviewed: Basic Metabolic Panel: Recent Labs    07/24/22 1009 07/25/22 0533 08/01/22 0800 08/02/22 0918 09/05/22 1155 09/30/22 1849 02/15/23 0645 02/16/23 0356 02/18/23 0413 02/24/23 0435 02/25/23 0448 02/26/23 0416  NA 138 139 141   < > 138   < > 131* 136   < > 128* 127* 129*  K 4.3 4.0 4.2   < > 4.4   < > 3.9 3.8   < > 4.9 4.6 4.3  CL 103 104 105   < > 94*   < > 94* 98   < > 95* 97* 96*  CO2 21* 21* 25*   < > 32   < > 19* 19*   < > 19* 18* 19*  GLUCOSE 132* 91  --    < > 74   < > 163* 110*   < > 103* 94 81  BUN 65* 62* 57*   < > 60*   < > 90* 77*   < > 106* 99* 100*  CREATININE 3.84* 3.71* 3.3*   < > 3.46*   < > 4.79* 4.28*   < > 4.99* 4.57* 4.66*  CALCIUM 9.3 9.6 10.0    < > 10.2   < > 9.8 9.7   < > 9.9 9.7 10.2  MG  --  1.9  --   --   --   --   --  2.2   < > 2.2 2.2 2.2  PHOS 4.9* 5.4*  --   --   --   --   --  5.7*  --   --   --   --   TSH  --   --  0.65  --  2.94  --  2.829  --   --   --   --   --    < > = values in this interval not displayed.   Liver Function Tests: Recent Labs    10/04/22 1300 12/07/22 2140 02/15/23 0645  AST 25 29 24   ALT 18 17 17   ALKPHOS 73 85 81  BILITOT 0.5 1.2 0.9  PROT 5.8* 6.1* 7.0  ALBUMIN 3.0* 3.2* 3.6   No results for input(s): "LIPASE", "AMYLASE" in the last 8760 hours. No results for input(s): "AMMONIA" in the last 8760 hours. CBC: Recent Labs    09/30/22 1849 10/11/22 1325 11/27/22 0105 11/27/22 0506 02/15/23 0551 02/15/23 0635 02/24/23 0435 02/25/23 0448 02/26/23 0416  WBC 9.8  --  8.5   < > 9.4   < > 9.2 10.9* 10.7*  NEUTROABS 6.3  --  4.4  --  7.9*  --   --   --   --   HGB 10.4*   < > 10.0*   < > 12.8   < > 11.3* 10.6* 10.3*  HCT 32.9*  --  32.0*   < > 39.6   < > 33.5* 31.5* 31.5*  MCV 97.9  --  101.3*   < > 100.8*   < > 97.4 95.7 96.6  PLT 343  --  227   < > 329   < > 363 361 366   < > = values in this interval  not displayed.   Lipid Panel: Recent Labs    02/16/23 0356  TRIG 163*   No results found for: "HGBA1C"  Procedures: CT ABDOMEN PELVIS WO CONTRAST  Result Date: 02/24/2023 CLINICAL DATA:  Abdominal pain, acute, nonlocalized. Stomach distention. EXAM: CT ABDOMEN AND PELVIS WITHOUT CONTRAST TECHNIQUE: Multidetector CT imaging of the abdomen and pelvis was performed following the standard protocol without IV contrast. RADIATION DOSE REDUCTION: This exam was performed according to the departmental dose-optimization program which includes automated exposure control, adjustment of the mA and/or kV according to patient size and/or use of iterative reconstruction technique. COMPARISON:  01/22/2012. FINDINGS: Lower chest: The heart is enlarged. There is a trace left pleural effusion.  Atelectasis or scarring is noted at the lung bases bilaterally. Hepatobiliary: The left lobe of the liver has a nodular contour, suggesting underlying cirrhosis. No focal abnormality is seen in the liver. No biliary ductal dilatation. Stones are present within the gallbladder. Pancreas: Unremarkable. No pancreatic ductal dilatation or surrounding inflammatory changes. Spleen: Surgically absent. Adrenals/Urinary Tract: Adrenal glands are within normal limits. Cysts are present in the kidneys bilaterally. No renal calculus or hydronephrosis bilaterally. The visualized portion of the urinary bladder is within normal limits. Stomach/Bowel: There is a small hiatal hernia. Stomach is within normal limits. Appendix is not seen. There is diffuse rectal wall thickening with surrounding fat stranding. Scattered diverticula are present along the colon without evidence of diverticulitis. No free air or pneumatosis. Vascular/Lymphatic: Aortic atherosclerosis. No enlarged abdominal or pelvic lymph nodes. Reproductive: Uterus and bilateral adnexa are unremarkable. Other: No abdominopelvic ascites. Musculoskeletal: Degenerative changes are present in the thoracolumbar spine. Total hip arthroplasty changes are present on the left. No acute osseous abnormality. IMPRESSION: 1. Marked rectal wall thickening with surrounding fat stranding suggesting proctitis. 2. Trace left pleural effusion with atelectasis at the lung bases. 3. Small hiatal hernia. 4. Diverticulosis without diverticulitis. 5. Nodular contour of the left lobe of the liver suggesting cirrhosis. 6. Cholelithiasis. 7. Aortic atherosclerosis Electronically Signed   By: Thornell Sartorius M.D.   On: 02/24/2023 21:53   DG Abd 1 View  Result Date: 02/23/2023 CLINICAL DATA:  Acute abdominal pain EXAM: ABDOMEN - 1 VIEW COMPARISON:  Abdominal x-ray 09/21/2022 FINDINGS: There is gaseous distention of the stomach. Air seen throughout nondilated colon and small bowel to the level  the rectum. There are no suspicious calcifications. There is dextroconvex curvature of the lumbar spine with degenerative change. Left hip arthroplasty is present. IMPRESSION: Gaseous distention of the stomach. Nonobstructive bowel gas pattern. Electronically Signed   By: Darliss Cheney M.D.   On: 02/23/2023 18:12   CT KNEE RIGHT WO CONTRAST  Result Date: 02/19/2023 CLINICAL DATA:  Knee pain EXAM: CT OF THE RIGHT KNEE WITHOUT CONTRAST TECHNIQUE: Multidetector CT imaging of the right knee was performed according to the standard protocol. Multiplanar CT image reconstructions were also generated. RADIATION DOSE REDUCTION: This exam was performed according to the departmental dose-optimization program which includes automated exposure control, adjustment of the mA and/or kV according to patient size and/or use of iterative reconstruction technique. COMPARISON:  02/18/2023 radiographs FINDINGS: Bones/Joint/Cartilage Meniscal chondrocalcinosis. Large knee joint effusion. Cortical thickening posterolaterally in the lateral tibial plateau and posterolaterally along the lateral femoral condyle, possibly degenerative although subcortical stress fractures are not excluded. There is some widening of the lateral compartment posteriorly raise the possibility of a prior compression of the lateral tibial plateau but no well-defined acute cortical fracture is identified. Tricompartmental marginal spurring noted. Ligaments Suboptimally assessed by  CT. Muscles and Tendons Unremarkable Soft tissues Moderate-sized Baker's cyst. Distal SFA and popliteal artery atheromatous vascular calcification. Subcutaneous edema anterior to the junction of the quadriceps tendon and lateral patellar retinaculum. IMPRESSION: 1. Large knee joint effusion. 2. Cortical thickening posterolaterally in the lateral tibial plateau and posterolaterally along the lateral femoral condyle, possibly degenerative although subcortical stress fractures are not  excluded. There is some widening of the lateral compartment posteriorly raise the possibility of a prior compression of the lateral tibial plateau but no well-defined acute cortical fracture is identified. 3. 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 4. Moderate-sized Baker's cyst. 5. Subcutaneous edema anterior to the junction of the quadriceps tendon and lateral patellar retinaculum. 6. Distal SFA and popliteal artery atheromatous vascular calcification. Electronically Signed   By: Gaylyn Rong M.D.   On: 02/19/2023 13:33   DG Knee 1-2 Views Right  Result Date: 02/18/2023 CLINICAL DATA:  Right knee pain and swelling following fall, initial encounter EXAM: RIGHT KNEE - 2 VIEW COMPARISON:  08/02/2022 FINDINGS: Tricompartmental degenerative changes are noted. There is suggestion of mildly displaced fracture through the intercondylar eminence of the proximal tibia best seen on the lateral projection. Cross-sectional imaging may be helpful for further evaluation. Large suprapatellar joint effusion is seen. IMPRESSION: Large joint effusion with findings suspicious for proximal tibial fracture. CT may be helpful when clinically able. Electronically Signed   By: Alcide Clever M.D.   On: 02/18/2023 22:12   US RENAL  Result Date: 02/18/2023 CLINICAL DATA:  Acute kidney injury. EXAM: RENAL / URINARY TRACT ULTRASOUND COMPLETE COMPARISON:  CT 12/08/2022, renal ultrasound 07/11/2022 FINDINGS: Right Kidney: Renal measurements: 8.1 x 4 x 3.3 cm = volume: 55 mL. Renal parenchymal atrophy and increased echogenicity. Simple cyst measures 3.6 cm. Additional smaller cysts. No hydronephrosis. Left Kidney: Renal measurements: 7.2 x 3.3 x 3.6 cm = volume: 44 mL. Renal parenchymal atrophy and increased echogenicity. There are multiple simple cysts, which were better appreciated on prior CT. No hydronephrosis. Bladder: Only minimally distended. Appears normal for degree of bladder distention. Other:  None. IMPRESSION: 1. No obstructive uropathy. 2. Renal parenchymal atrophy and findings of chronic medical renal disease. Electronically Signed   By: Narda Rutherford M.D.   On: 02/18/2023 14:02   EEG adult  Result Date: 02/15/2023 Charlsie Quest, MD     02/15/2023  4:07 PM Patient Name: AN LANNAN MRN: [id number] Epilepsy Attending: Charlsie Quest Referring Physician/Provider: Lynnell Catalan, MD Date: 02/15/2023 Duration: 22.26 mins Patient history: 82 yo F with acutely altered mental status with associated hypertensive crisis, but without visible seizure activity and only left facial droop as a consistent focal deficit. EEG to evaluate for seizure Level of alertness: Awake AEDs during EEG study: None Technical aspects: This EEG study was done with scalp electrodes positioned according to the 10-20 International system of electrode placement. Electrical activity was reviewed with band pass filter of 1-70Hz , sensitivity of 7 uV/mm, display speed of 61mm/sec with a 60Hz  notched filter applied as appropriate. EEG data were recorded continuously and digitally stored.  Video monitoring was available and reviewed as appropriate. Description: EEG showed continuous generalized predominantly 5 to 6 Hz theta slowing admixed with intermittent 2-3hz  delta slowing. Hyperventilation and photic stimulation were not performed.   ABNORMALITY - Continuous slow, generalized IMPRESSION: This study is suggestive of mild to moderate diffuse encephalopathy. No seizures or epileptiform discharges were seen throughout the recording. Charlsie Quest   MR BRAIN WO CONTRAST  Result Date: 02/15/2023 CLINICAL  DATA:  Mental status change, unknown cause. EXAM: MRI HEAD WITHOUT CONTRAST TECHNIQUE: Multiplanar, multiecho pulse sequences of the brain and surrounding structures were obtained without intravenous contrast. COMPARISON:  Head CT February 15, 2023. FINDINGS: Brain: No acute infarction, hemorrhage, extra-axial collection  or mass lesion. Moderate supratentorial and prominence of the cerebral sulci related to parenchymal volume loss. Minimal chronic white matter. Vascular: Normal flow voids.  Hypoplastic vertebral. Skull and upper cervical spine: Postsurgical changes in the visualized upper cervical spine. No focal identified. Sinuses/Orbits: Mild mucosal thickening of the bilateral mastoids and ethmoid cells. Bilateral lens surgery. Other: None. IMPRESSION: 1. No acute intracranial abnormality. 2. Moderate parenchymal volume loss. Electronically Signed   By: Baldemar Lenis M.D.   On: 02/15/2023 15:22   DG Chest Port 1 View  Result Date: 02/15/2023 CLINICAL DATA:  82 year old female unresponsive patient. EXAM: PORTABLE CHEST 1 VIEW COMPARISON:  Chest x-ray 12/10/2022. FINDINGS: Lung volumes are low. No consolidative airspace disease. No pleural effusions. No pneumothorax. No evidence of pulmonary edema. Heart size is normal. Upper mediastinal contours are within normal limits. Status post median sternotomy for CABG. Atherosclerotic calcifications are noted in the thoracic aorta. IMPRESSION: 1. Low lung volumes without radiographic evidence of acute cardiopulmonary disease. 2. Aortic atherosclerosis. Electronically Signed   By: Trudie Reed M.D.   On: 02/15/2023 07:33   CT HEAD WO CONTRAST  Result Date: 02/15/2023 CLINICAL DATA:  Lethargy.  Left-sided facial droop and weakness. EXAM: CT HEAD WITHOUT CONTRAST CT CERVICAL SPINE WITHOUT CONTRAST TECHNIQUE: Multidetector CT imaging of the head and cervical spine was performed following the standard protocol without intravenous contrast. Multiplanar CT image reconstructions of the cervical spine were also generated. RADIATION DOSE REDUCTION: This exam was performed according to the departmental dose-optimization program which includes automated exposure control, adjustment of the mA and/or kV according to patient size and/or use of iterative reconstruction  technique. COMPARISON:  08/02/2022 FINDINGS: CT HEAD FINDINGS Brain: No evidence of acute infarction, hemorrhage, hydrocephalus, extra-axial collection or mass lesion/mass effect. Generalized brain atrophy in keeping with age Vascular: No hyperdense vessel or unexpected calcification. Skull: Normal. Negative for fracture or focal lesion. Sinuses/Orbits: No acute finding. CT CERVICAL SPINE FINDINGS Alignment: Normal. Skull base and vertebrae: No acute fracture. No primary bone lesion or focal pathologic process. C3-C5 ACDF with solid arthrodesis Soft tissues and spinal canal: No prevertebral fluid or swelling. No visible canal hematoma. Disc levels: Disc collapse and endplate spurring especially at C5-6 to C6-7. 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 Upper chest: No visible injury IMPRESSION: No acute finding in the head or cervical spine. No specific cause for symptoms. Electronically Signed   By: Tiburcio Pea M.D.   On: 02/15/2023 06:57   CT CERVICAL SPINE WO CONTRAST  Result Date: 02/15/2023 CLINICAL DATA:  Lethargy.  Left-sided facial droop and weakness. EXAM: CT HEAD WITHOUT CONTRAST CT CERVICAL SPINE WITHOUT CONTRAST TECHNIQUE: Multidetector CT imaging of the head and cervical spine was performed following the standard protocol without intravenous contrast. Multiplanar CT image reconstructions of the cervical spine were also generated. RADIATION DOSE REDUCTION: This exam was performed according to the departmental dose-optimization program which includes automated exposure control, adjustment of the mA and/or kV according to patient size and/or use of iterative reconstruction technique. COMPARISON:  08/02/2022 FINDINGS: CT HEAD FINDINGS Brain: No evidence of acute infarction, hemorrhage, hydrocephalus, extra-axial collection or mass lesion/mass effect. Generalized brain atrophy in keeping with age Vascular: No hyperdense vessel or unexpected calcification. Skull: Normal. Negative  for  fracture or focal lesion. Sinuses/Orbits: No acute finding. CT CERVICAL SPINE FINDINGS Alignment: Normal. Skull base and vertebrae: No acute fracture. No primary bone lesion or focal pathologic process. C3-C5 ACDF with solid arthrodesis Soft tissues and spinal canal: No prevertebral fluid or swelling. No visible canal hematoma. Disc levels: Disc collapse and endplate spurring especially at C5-6 to C6-7. 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 Upper chest: No visible injury IMPRESSION: No acute finding in the head or cervical spine. No specific cause for symptoms. Electronically Signed   By: Tiburcio Pea M.D.   On: 02/15/2023 06:57    Assessment/Plan  Pneumonia of right lower lobe due to infectious organism elevated wbc 18.6, CXR small right base infiltrate, Doxycycline started subsequently. The patient is afebrile, no O2 desaturation,  denied chest pain, increased SOB, cough, dysuria, or abd pain/discomfort. Her sleep, appetite, and tolerance to therapy at her baseline today.  Repeat CBC/diff  Leukocytosis elevated wbc 18.6, CXR small right base infiltrate, Doxycycline started subsequently. The patient is afebrile, no O2 desaturation,  denied chest pain, increased SOB, cough, dysuria, or abd pain/discomfort. Her sleep, appetite, and tolerance to therapy at her baseline today.  Repeat CBC/diff.   Essential hypertension labile, goal Sbp 150s, on Amlodipine 10mg  every day, Coreg bid, Clonidine 0.2mg  patch, off Losartan and Torsemide.   CKD (chronic kidney disease) stage 4, GFR 15-29 ml/min (HCC) - baseline SCr 2.5. follows with Dr. Malen Gauze with [facility] f/u nephrology, creat 4.64 03/02/23, peak at 5.1, negative renal US, repeat CMP/eGFR  Hyponatremia Na 131 03/02/23  Coronary artery disease involving native coronary artery of native heart without angina pectoris CAD, s/p CABG. Hx of DVT/PE failed Eliquis 2/2 GI bleed, on Plavix presently.   Bleeding hemorrhoids No bleeding  today.   Rheumatoid arthritis (HCC) on Arava  Acquired hypothyroidism on Levothyroxine, TSH 2.829 02/15/23<<6.01 03/02/23, repeat TSH   GERD (gastroesophageal reflux disease)  taking Pantoprazole. Hgb 10.1 03/02/23   Labs/tests ordered: CBC/diff, CMP/eGFR, TSH Plan of care reviewed with the patient, the patient's husband and daughter, and charge nurse  Time spend 35 minutes.

## 2023-03-05 NOTE — Assessment & Plan Note (Signed)
elevated wbc 18.6, CXR small right base infiltrate, Doxycycline started subsequently. The patient is afebrile, no O2 desaturation,  denied chest pain, increased SOB, cough, dysuria, or abd pain/discomfort. Her sleep, appetite, and tolerance to therapy at her baseline today.  Repeat CBC/diff.  03/03/23 urine culture <10,000c/ml

## 2023-03-05 NOTE — Assessment & Plan Note (Signed)
on [place]

## 2023-03-05 NOTE — Assessment & Plan Note (Signed)
labile, goal Sbp 150s, on Amlodipine 10mg  every day, Coreg bid, Clonidine 0.2mg  patch, off Losartan and Torsemide.

## 2023-03-05 NOTE — Assessment & Plan Note (Signed)
taking Pantoprazole. Hgb 10.1 03/02/23

## 2023-03-05 NOTE — Assessment & Plan Note (Signed)
elevated wbc 18.6, CXR small right base infiltrate, Doxycycline started subsequently. The patient is afebrile, no O2 desaturation,  denied chest pain, increased SOB, cough, dysuria, or abd pain/discomfort. Her sleep, appetite, and tolerance to therapy at her baseline today.  Repeat CBC/diff

## 2023-03-05 NOTE — Assessment & Plan Note (Signed)
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

## 2023-03-05 NOTE — Assessment & Plan Note (Signed)
Na 131 05/11/22

## 2023-03-05 NOTE — Assessment & Plan Note (Signed)
No bleeding today.

## 2023-03-05 NOTE — Assessment & Plan Note (Signed)
on Levothyroxine, TSH 2.829 02/15/23<<6.01 03/02/23, repeat TSH

## 2023-03-05 NOTE — Assessment & Plan Note (Signed)
f/u nephrology, creat 4.64 03/02/23, peak at 5.1, negative renal US, repeat CMP/eGFR

## 2023-03-06 DIAGNOSIS — G3184 Mild cognitive impairment, so stated: Secondary | ICD-10-CM | POA: Insufficient documentation

## 2023-03-06 NOTE — Assessment & Plan Note (Signed)
03/05/23 MMSE 28/30

## 2023-03-12 ENCOUNTER — Ambulatory Visit (HOSPITAL_COMMUNITY)
Admission: RE | Admit: 2023-03-12 | Discharge: 2023-03-12 | Disposition: A | Discharge status: Home / Self Care | Payer: Medicare PPO | Source: Ambulatory Visit | Attending: Nephrology | Admitting: Nephrology

## 2023-03-12 VITALS — BP 121/46 | HR 79 | Temp 97.4°F

## 2023-03-12 DIAGNOSIS — N184 Chronic kidney disease, stage 4 (severe): Secondary | ICD-10-CM | POA: Diagnosis not present

## 2023-03-12 LAB — IRON AND TIBC
Iron: 106 ug/dL (ref 28–170)
Saturation Ratios: 53 % — ABNORMAL HIGH (ref 10.4–31.8)
TIBC: 199 ug/dL — ABNORMAL LOW (ref 250–450)
UIBC: 93 ug/dL

## 2023-03-12 LAB — FERRITIN: Ferritin: 474 ng/mL — ABNORMAL HIGH (ref 11–307)

## 2023-03-12 LAB — POCT HEMOGLOBIN-HEMACUE: Hemoglobin: 8.6 g/dL — ABNORMAL LOW (ref 12.0–15.0)

## 2023-03-12 MED ORDER — EPOETIN ALFA-EPBX 10000 UNIT/ML IJ SOLN
INTRAMUSCULAR | Status: AC
  Filled 2023-03-12: qty 1

## 2023-03-12 MED ORDER — EPOETIN ALFA-EPBX 10000 UNIT/ML IJ SOLN
10000.0000 [IU] | INTRAMUSCULAR | Status: DC
  Administered 2023-03-12: 10000 [IU] via SUBCUTANEOUS

## 2023-03-13 ENCOUNTER — Encounter: Payer: Self-pay | Admitting: Nurse Practitioner

## 2023-03-13 ENCOUNTER — Ambulatory Visit: Payer: Self-pay | Admitting: Licensed Clinical Social Worker

## 2023-03-13 ENCOUNTER — Encounter (HOSPITAL_COMMUNITY): Payer: Medicare PPO

## 2023-03-13 ENCOUNTER — Non-Acute Institutional Stay (SKILLED_NURSING_FACILITY): Payer: Medicare PPO | Admitting: Nurse Practitioner

## 2023-03-13 DIAGNOSIS — D631 Anemia in chronic kidney disease: Secondary | ICD-10-CM

## 2023-03-13 DIAGNOSIS — K623 Rectal prolapse: Secondary | ICD-10-CM | POA: Diagnosis not present

## 2023-03-13 DIAGNOSIS — I10 Essential (primary) hypertension: Secondary | ICD-10-CM

## 2023-03-13 DIAGNOSIS — Z66 Do not resuscitate: Secondary | ICD-10-CM | POA: Diagnosis not present

## 2023-03-13 DIAGNOSIS — B3749 Other urogenital candidiasis: Secondary | ICD-10-CM

## 2023-03-13 DIAGNOSIS — J189 Pneumonia, unspecified organism: Secondary | ICD-10-CM | POA: Diagnosis not present

## 2023-03-13 DIAGNOSIS — N184 Chronic kidney disease, stage 4 (severe): Secondary | ICD-10-CM | POA: Diagnosis not present

## 2023-03-13 NOTE — Assessment & Plan Note (Signed)
f/u nephrology, Bun/creat 121/6.8 03/13/23 peak at 5.1 in the past, negative renal US, f/u Nephrology.

## 2023-03-13 NOTE — Assessment & Plan Note (Signed)
labile, goal Sbp 150s, on Amlodipine 10mg  every day, Coreg bid, Clonidine 0.2mg  patch, off Losartan and Torsemide.

## 2023-03-13 NOTE — Assessment & Plan Note (Signed)
Prolapsed rectum, no active bleeding, but discomfort. Apply 1% Hydrocortisone cream daily for comfort, HPOA desire colorectal surgery consultation.

## 2023-03-13 NOTE — Patient Outreach (Signed)
Care Coordination   Follow Up Visit Note   03/13/2023 Name: [name] MRN: [id number] DOB: Sep 09, 1940  SAFAA GRENELL is a 82 y.o. year old female who sees Plotnikov, Georgina Quint, MD for primary care. I spoke with  [name] / Lanell Persons, son of client, via  phone today.  What matters to the patients health and wellness today? Patient needs help with ADLs and daily care needs     Goals Addressed             This Visit's Progress    patient needs help with ADLs and daily care needs       Interventions:  LCSW spoke via phone today with Lanell Persons, son of client, about client needs. LCSW spoke also via phone today with [name] about her needs and status Client is currently receiving care at [facility] facility. She and her son, Loraine Leriche, are communicating with SW at SNF facility to determine discharge plans for client Client has previously lived at independently living facility with her spouse in [place], [place] Client needs help with ADLs, such as bathing, dressing , meal preparation Onawa Inglis, son, reported that he lives in [place], [place] and that another sibling lives in [place], [place]. Loraine Leriche said he thinks client may need more assistance if she returns to independent living level  Lanell Persons and client are working with SW at current client facility to determine care needs of client, daily ADLs assistance needs of client, fall risk of client, medication assistance needs of client Loraine Leriche has said that client has decreased energy level at present Loraine Leriche has said that when client was with her spouse in independent living that they had person who helped with home cleaning for them one time per month LCSW and client spoke of care support at independent living facility when client returns there. She said independent living  facility has care givers that they can provide (fee for services) to help client in that environment as needed LCSW encouraged client and Loraine Leriche to talk with SW at  current SNF facility to do active discharge planning for client as needed  Discussed client needs regarding daily ADLs Client said she can take a bath and dress adequately. Discussed whether or not client may need some ADLs help when she goes back to Independent living level [place] client and Rosia Rhome LCSW name and phone number and encouraged both to call LCSW as needed for SW support for client        SDOH assessments and interventions completed:  Yes  SDOH Interventions Today    Flowsheet Row Most Recent Value  SDOH Interventions   Depression Interventions/Treatment  Counseling  Physical Activity Interventions Other (Comments)  [mobility challenges,  decreased energy]  Stress Interventions Other (Comment)  [has stress in managing daily care needs,  has stress in managing medical needs]        Care Coordination Interventions:  Yes, provided  Interventions Today    Flowsheet Row Most Recent Value  Chronic Disease   Chronic disease during today's visit Other  [spoke with Lanell Persons, son of client about client needs,  spoke with [name] about her needs and status]  General Interventions   General Interventions Discussed/Reviewed General Interventions Discussed, Community Resources  Exercise Interventions   Exercise Discussed/Reviewed Physical Activity  [client fatigues easily]  Physical Activity Discussed/Reviewed Physical Activity Discussed  Education Interventions   Education Provided Provided Education  Provided Surveyor, minerals  Mental Health Interventions  Mental Health Discussed/Reviewed Coping Strategies  Clarita Crane is trying to use coping skills to manage anxiety issues]  Nutrition Interventions   Nutrition Discussed/Reviewed Nutrition Discussed  Pharmacy Interventions   Pharmacy Dicussed/Reviewed Pharmacy Topics Discussed  Safety Interventions   Safety Discussed/Reviewed Fall Risk        Follow up plan: Client and Dynastie Demaio, son of  client, have LCSW name and phone number and can call LCSW as  Needed to discuss SW needs of client   Encounter Outcome:  Patient Visit Completed   Kelton Pillar.Kimberla Driskill MSW, LCSW Licensed Visual merchandiser [facility] Care Management [phone]

## 2023-03-13 NOTE — Progress Notes (Unsigned)
Location:  [facility] Room Number: 063-A Place of Service:  SNF (31) Provider:  Caelyn Route X,NP  Plotnikov, Georgina Quint, MD  Patient Care Team: [name], MD as PCP - General (Internal Medicine) [name], MD as PCP - Cardiology (Cardiology) [name], MD as Consulting Physician (Cardiology) [name], MD as Consulting Physician (Rheumatology) Armbruster, [name], MD as Consulting Physician (Gastroenterology) [name], MD as Consulting Physician (Pulmonary Disease) [name], MD (Inactive) as Consulting Physician (Cardiothoracic Surgery) [name], MD as Consulting Physician (Obstetrics and Gynecology) Plotnikov, Georgina Quint, MD Colletta Maryland, RN as [facility] Care Management Roger Kill, NP as Nurse Practitioner (Nephrology)  Extended Emergency Contact Information Primary Emergency Contact: [name] Address: [address]           [place], [place] [address] [place] Home Phone: [phone] Mobile Phone: [phone] Relation: Spouse Secondary Emergency Contact: [facility] Address: [address]          [place], [place] [address] [place] Mobile Phone: [phone] Relation: Son  Code Status:  DNR Goals of care: Advanced Directive information    03/13/2023    3:38 PM  Advanced Directives  Does Patient Have a Medical Advance Directive? Yes  Type of Advance Directive Out of facility DNR (pink MOST or yellow form)  Does patient want to make changes to medical advance directive? No - Patient declined  Pre-existing out of facility DNR order (yellow form or pink MOST form) Pink MOST/Yellow Form most recent copy in chart - Physician notified to receive inpatient order     Chief Complaint  Patient presents with  . Medical Management of Chronic Issues    Routine visit and discuss covid vaccine and fecal DNA(Cologuard).    HPI:  Pt  is a 82 y.o. female seen today for medical management of chronic diseases.    Prolapsed rectum, no active bleeding, but discomfort.   PNA 03/06/2023 wbc 18.6, CXR small right base infiltrate 03/05/23, Doxycycline is completed.              Hospitalized 02/15/23-02/26/23 left facial and arm weakness, underwent neurology evaluation, MRI brain negative. Left facial weakness likely from left facial shingles, resolved.              HTN labile, goal Sbp 150s, on Amlodipine 10mg  every day, Coreg bid, Clonidine 0.2mg  patch, off Losartan and Torsemide.              CKD, f/u nephrology, Bun/creat 121/6.8 03/13/23 peak at 5.1 in the past, negative renal US             Post herpetic neurologia left face, treated with Veltrex. On Tylenol             Pseudogout/gout R knee, s/p arthrocentesis,  on Allopurinol             Hyponatremia, Na 137 03/13/23             Constipation, prn Bisacodyl, Colace             CAD, s/p CABG             Hx of DVT/PE failed Eliquis 2/2 GI bleed, on Plavix presently.              COPD, prn Albuterol,              RA, on [place]  Hypothyroidism, on Levothyroxine, TSH 2.829 02/15/23<<6.01 03/02/23             GERD, taking Pantoprazole. Hgb 8.4 03/13/23             HLD, on Atorvastatin  Anemia, Hgb 8.6 11/24,  8.4, Iron 106, 03/13/23, 02/26/23 10.3, started Retacrit inj 03/05/23   Past Medical History:  Diagnosis Date  . Abdominal aortic aneurysm (HCC)    REPAIRED IN 1996 BY [name]  AND HAS RECENTLY BEEN FOLLOWED BY [name]  . 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 Acute bronchitis 04/03/2016   12/17  . Acute respiratory failure with hypoxia (HCC) 04/15/2016  . Adenomatous colon polyp    tubular  . Anemia   . BRBPR (bright red blood per rectum) 11/17/2021  . CAD in native artery 1996, 2002, 2005    Status post CABG x1 with LIMA-LAD for ostial LAD 90% stenosis --> down to 50% in 2002 and  30% in 2005.;  Atretic LIMA; Myoview 06/2010: Fixed anteroseptal, apical and inferoapical defect with moderate size. Most likely scar. Mild subendocardial ischemia. EF 71% LOW RISK.   [name] Cervical disc disease    fracture  . Chronic kidney disease    CKD4  . COPD (chronic obstructive pulmonary disease) (HCC)   . Diverticulosis   . DVT (deep venous thrombosis) (HCC)    RLE; Sept '23  . History of DVT (deep vein thrombosis) 03/06/2022  . History of pulmonary embolus (PE)    Oct '23  . Hyperlipidemia   . Hypertension   . Hypothyroidism (acquired)    hypo  . Myocardial infarction (HCC) 11/2014   TIA  . Polymyalgia rheumatica (HCC)    2011 Dr. Kellie Simmering  . Pulmonary emboli (HCC) 02/06/2022   01/2022 PE/DVT post-op. Lower GI bleed     Treat edema  Start Eliquis at the reduced dose due to Creat>1.5 and age >38  . Rheumatoid arthritis (HCC) 2011   Dr.Truslow; fracture knees, hands and wrists -   . S/P CABG x 1 1996   CABG--LIMA-LAD for ostial LAD (not felt to be PCI amenable). EF NORMAL then; LIMA now atretic  . Shortness of breath dyspnea    with exertion  . Stroke [facility]) 11/2014   TIA   . Urinary frequency    Past Surgical History:  Procedure Laterality Date  . ABDOMINAL AORTIC ANEURYSM REPAIR  1996   Complicated by mesenteric artery stenosis and splenic artery infarction with acquired Asplenia  . APPENDECTOMY    . BIOPSY  07/08/2022   Procedure: BIOPSY;  Surgeon: Benancio Deeds, MD;  Location: [facility] ENDOSCOPY;  Service: Gastroenterology;;  . BIOPSY  11/28/2022   Procedure: BIOPSY;  Surgeon: [name], MD;  Location: [facility] ENDOSCOPY;  Service: Gastroenterology;;  . [name]  07/2011   right foot  . CARDIAC CATHETERIZATION  2005   (Most recent CATH) - ostial LAD lesion 20-30% (down from 90% initially). Atretic LIMA. Minimal disease the RCA and Circumflex system.  [name] CARPAL TUNNEL RELEASE Left   . CATARACT EXTRACTION Bilateral   . CERVICAL SPINE SURGERY     plate [id number] Dr. Wynetta Emery   . COLONOSCOPY    . COLONOSCOPY WITH PROPOFOL N/A 11/28/2022   Procedure: COLONOSCOPY WITH PROPOFOL;  Surgeon: [name], MD;  Location: [facility] ENDOSCOPY;  Service: Gastroenterology;  Laterality: N/A;  . CORONARY ARTERY BYPASS GRAFT  1996   INCLUDED AN INTERNAL MAMMARY  ARTERY TO THE LAD. EF WAS NORMAL  . ESOPHAGOGASTRODUODENOSCOPY (EGD) WITH PROPOFOL N/A 07/08/2022   Procedure: ESOPHAGOGASTRODUODENOSCOPY (EGD) WITH PROPOFOL;  Surgeon: Benancio Deeds, MD;  Location: [facility] ENDOSCOPY;  Service: Gastroenterology;  Laterality: N/A;  . ESOPHAGOGASTRODUODENOSCOPY (EGD) WITH PROPOFOL N/A 11/28/2022   Procedure: ESOPHAGOGASTRODUODENOSCOPY (EGD) WITH PROPOFOL;  Surgeon: [name], MD;  Location: [facility] ENDOSCOPY;  Service: Gastroenterology;  Laterality: N/A;  . INGUINAL HERNIA REPAIR Right   . LAPAROSCOPIC APPENDECTOMY N/A 06/28/2016   Procedure: APPENDECTOMY LAPAROSCOPIC;  Surgeon: [name], MD;  Location: [facility] ORS;  Service: General;  Laterality: N/A;  . NM MYOVIEW LTD  06/2010   Fixed anteroseptal, apical and inferoapical defect with moderate size. Most likely scar. Mild subendocardial ischemia. EF 71% LOW RISK.   [name] POLYPECTOMY  11/28/2022   Procedure: POLYPECTOMY;  Surgeon: [name], MD;  Location: [facility] ENDOSCOPY;  Service: Gastroenterology;;  . SPLENECTOMY    . TOTAL HIP ARTHROPLASTY Left 01/23/2022   Procedure: LEFT TOTAL HIP ARTHROPLASTY ANTERIOR APPROACH;  Surgeon: [name], MD;  Location: [facility] OR;  Service: Orthopedics;  Laterality: Left;  3-C  . TRANSTHORACIC ECHOCARDIOGRAM  12/2014   [facility]: Normal LV size & function. EF 55-60%,   . vagina polyp    . VIDEO ASSISTED THORACOSCOPY (VATS)/WEDGE RESECTION Left 03/02/2015   Procedure: VIDEO ASSISTED THORACOSCOPY (VATS), MINI THORACOTOMY, LEFT UPPER LOBE WEDGE, TAKE DOWN OF INTERNAL MAMMARY LESIONS, PLACEMENT OF ON-Q PUMP;  Surgeon: [name], MD;  Location: [facility] OR;  Service: Thoracic;  Laterality: Left;  [name] VIDEO  BRONCHOSCOPY N/A 03/02/2015   Procedure: BRONCHOSCOPY;  Surgeon: [name], MD;  Location: [facility] OR;  Service: Thoracic;  Laterality: N/A;  . VIDEO BRONCHOSCOPY WITH ENDOBRONCHIAL NAVIGATION N/A 10/08/2017   Procedure: VIDEO BRONCHOSCOPY WITH ENDOBRONCHIAL NAVIGATION WITH BIOPSIES OF LEFT UPPER LOBE AND LEFT LOWER LOBE;  Surgeon: [name], MD;  Location: [facility] OR;  Service: Thoracic;  Laterality: N/A;    Allergies  Allergen Reactions  . Nsaids Other (See Comments)    Stomach upset Told to avoid due to Eliquis  . Aspirin Other (See Comments)    Stomach upset Told to avoid due to Eliquis  . Codeine Nausea And Vomiting  . Eliquis [Apixaban] Other (See Comments)    Gi upset   . Nitrostat [Nitroglycerin] Other (See Comments)    Bradycardia. Drop in heart rate   . Tramadol Other (See Comments)    Confusion    Outpatient Encounter Medications as of 03/13/2023  Medication Sig  . acetaminophen (TYLENOL) 325 MG tablet Take 2 tablets (650 mg total) by mouth 2 (two) times daily as needed (generalized pain).  [name] albuterol (VENTOLIN HFA) 108 (90 Base) MCG/ACT inhaler Inhale 1-2 puffs into the lungs every 4 (four) hours as needed for wheezing or shortness of breath.  . allopurinol (ZYLOPRIM) 100 MG tablet Take 1 tablet (100 mg total) by mouth daily.  [name] amLODipine (NORVASC) 10 MG tablet Take 1 tablet (10 mg total) by mouth daily.  [name] ascorbic acid (VITAMIN C) 500 MG tablet Take 1 tablet (500 mg total) by mouth daily.  [name] atorvastatin (LIPITOR) 80 MG tablet Take 1 tablet (80 mg total) by mouth daily.  . B Complex-C (B-COMPLEX WITH VITAMIN C) tablet Take 1 tablet by mouth daily.  . bisacodyl (DULCOLAX) 5 MG EC tablet Take 2 tablets (10 mg total) by mouth daily as needed for moderate constipation or severe constipation.  . carvedilol (COREG) 6.25 MG tablet Take 1 tablet (  6.25 mg total) by mouth 2 (two) times daily with a meal.  . Cholecalciferol (VITAMIN D3) 50 MCG (2000 UT) capsule Take 1  capsule (2,000 Units total) by mouth daily.  . cloNIDine (CATAPRES - DOSED IN MG/24 HR) 0.2 mg/24hr patch Place 1 patch (0.2 mg total) onto the skin once a week.  . clopidogrel (PLAVIX) 75 MG tablet Take 1 tablet (75 mg total) by mouth every other day.  . Cyanocobalamin 2500 MCG TABS Take 2,500 mcg by mouth daily.  [name] docusate sodium (COLACE) 100 MG capsule Take 1 capsule (100 mg total) by mouth 2 (two) times daily as needed for mild constipation.  . hydrOXYzine (ATARAX) 25 MG tablet Take 1 tablet (25 mg total) by mouth 3 (three) times daily as needed for itching.  . leflunomide (ARAVA) 20 MG tablet Take 1 tablet (20 mg total) by mouth daily.  [name] levothyroxine (SYNTHROID) 88 MCG tablet Take 1 tablet (88 mcg total) by mouth daily before breakfast.  . melatonin 3 MG TABS tablet Take 1 tablet (3 mg total) by mouth at bedtime as needed.  . Multiple Vitamins-Minerals (CENTRUM ADULT PO) Take 1 tablet by mouth daily.  . pantoprazole (PROTONIX) 40 MG tablet Take 1 tablet (40 mg total) by mouth daily.  . sertraline (ZOLOFT) 25 MG tablet Take 25 mg by mouth daily.  [name] triamcinolone (NASACORT) 55 MCG/ACT AERO nasal inhaler Place 1 spray into the nose daily.   No facility-administered encounter medications on file as of 03/13/2023.    Review of Systems  Constitutional:  Positive for fatigue. Negative for appetite change and fever.  HENT:  Negative for congestion and trouble swallowing.   Eyes:  Negative for visual disturbance.  Respiratory:  Positive for cough and shortness of breath. Negative for wheezing.        DOE  Cardiovascular:  Positive for leg swelling.  Gastrointestinal:  Positive for anal bleeding, blood in stool and rectal pain. Negative for abdominal pain, constipation, nausea and vomiting.  Genitourinary:  Negative for difficulty urinating, dysuria and urgency.  Musculoskeletal:  Positive for arthralgias, back pain and gait problem. Negative for joint swelling.  Skin:  Negative for color  change.       Urogenital irritation, redness  Neurological:  Negative for tremors, weakness and headaches.       Left facial post herpetic neuralgia.   Psychiatric/Behavioral:  Negative for behavioral problems and sleep disturbance. The patient is not nervous/anxious.     Immunization History  Administered Date(s) Administered  . Fluad Quad(high Dose 65+) 02/03/2021, 01/16/2022  . Fluad Trivalent(High Dose 65+) 02/10/2023  . Influenza Whole 05/02/2001, 01/27/2008, 01/12/2010, 12/31/2010, 01/25/2012  . Influenza, High Dose Seasonal PF 01/14/2014, 02/13/2015, 12/21/2016, 01/09/2018, 12/29/2018  . Influenza,inj,Quad PF,6+ Mos 12/23/2015  . Influenza-Unspecified 02/03/2013, 01/13/2014, 02/13/2015, 01/20/2020  . Meningococcal polysaccharide vaccine (MPSV4) 03/21/2012  . Moderna Covid-19 Fall Seasonal Vaccine 20yrs & older 06/09/2022  . Moderna SARS-COV2 Booster Vaccination 03/15/2020  . Moderna Sars-Covid-2 Vaccination 06/02/2019, 06/30/2019  . PNEUMOCOCCAL CONJUGATE-20 02/10/2023  . PPD Test 11/18/2013  . [employer], [employer] 31yrs & up 01/19/2021, 10/12/2021  . Pneumococcal Conjugate-13 03/20/2013  . Pneumococcal Polysaccharide-23 05/02/2000, 06/23/2010, 02/26/2018  . Td 05/01/2006  . Tdap 06/01/2016  . Zoster Recombinant(Shingrix) 05/16/2017, 07/20/2017  . Zoster, Live 01/17/2006   Pertinent  Health Maintenance Due  Topic Date Due  . INFLUENZA VACCINE  Completed  . DEXA SCAN  Completed  . Colonoscopy  Discontinued      06/13/2022   10:42 PM  08/07/2022   11:33 AM 09/05/2022   11:02 AM 01/26/2023    1:48 PM 02/08/2023    2:33 PM  Fall Risk  Falls in the past year? 1 1 1  0 1  Was there an injury with Fall? 0 1 1 0 1  Fall Risk Category Calculator 1 3 2  0 3  Patient at Risk for Falls Due to  History of fall(s) Impaired balance/gait No Fall Risks History of fall(s)  Patient at Risk for Falls Due to - Comments    use a walker   Fall risk Follow up  Falls  evaluation completed Falls evaluation completed Falls evaluation completed Falls evaluation completed   Functional Status Survey:    Vitals:   03/13/23 1536 03/14/23 1602  BP: (!) 146/64 (!) 159/66  Pulse: 87   Resp: 19   Temp: 97.8 F (36.6 C)   SpO2: 90%   Weight: 129 lb 14.4 oz (58.9 kg)   Height: 5\' 3"  (1.6 m)    Body mass index is 23.01 kg/m. Physical Exam Constitutional:      Appearance: Normal appearance.     Comments: Exhausted easily.   HENT:     Head: Normocephalic and atraumatic.     Nose:     Comments: Facial bruises, discomfort bridge of nose palpated.     Mouth/Throat:     Mouth: Mucous membranes are moist.  Eyes:     Extraocular Movements: Extraocular movements intact.     Conjunctiva/sclera: Conjunctivae normal.     Pupils: Pupils are equal, round, and reactive to light.  Cardiovascular:     Rate and Rhythm: Normal rate and regular rhythm.     Heart sounds: No murmur heard. Pulmonary:     Effort: Pulmonary effort is normal.     Breath sounds: No rales.  Abdominal:     General: Bowel sounds are normal.     Palpations: Abdomen is soft.     Tenderness: There is no abdominal tenderness.  Genitourinary:    Comments: Prolapsed rectum, c/o discomfort, no active bleeding.  Musculoskeletal:     Cervical back: Normal range of motion and neck supple.     Right lower leg: Edema present.     Left lower leg: Edema present.     Comments: Trace edema BLE L>R  Skin:    General: Skin is warm and dry.     Comments: L hip surgical scar. Urogenital area redness, irritable.   Neurological:     General: No focal deficit present.     Mental Status: She is alert and oriented to person, place, and time. Mental status is at baseline.     Motor: No weakness.     Gait: Gait abnormal.     Comments: Left facial post herpetic neuralgia.   Psychiatric:        Mood and Affect: Mood normal.        Behavior: Behavior normal.        Thought Content: Thought content normal.     Labs reviewed: Recent Labs    07/24/22 1009 07/25/22 0533 08/01/22 0800 02/16/23 0356 02/18/23 0413 02/24/23 0435 02/25/23 0448 02/26/23 0416  NA 138 139   < > 136   < > 128* 127* 129*  K 4.3 4.0   < > 3.8   < > 4.9 4.6 4.3  CL 103 104   < > 98   < > 95* 97* 96*  CO2 21* 21*   < > 19*   < >  19* 18* 19*  GLUCOSE 132* 91   < > 110*   < > 103* 94 81  BUN 65* 62*   < > 77*   < > 106* 99* 100*  CREATININE 3.84* 3.71*   < > 4.28*   < > 4.99* 4.57* 4.66*  CALCIUM 9.3 9.6   < > 9.7   < > 9.9 9.7 10.2  MG  --  1.9  --  2.2   < > 2.2 2.2 2.2  PHOS 4.9* 5.4*  --  5.7*  --   --   --   --    < > = values in this interval not displayed.   Recent Labs    10/04/22 1300 12/07/22 2140 02/15/23 0645  AST 25 29 24   ALT 18 17 17   ALKPHOS 73 85 81  BILITOT 0.5 1.2 0.9  PROT 5.8* 6.1* 7.0  ALBUMIN 3.0* 3.2* 3.6   Recent Labs    09/30/22 1849 10/11/22 1325 11/27/22 0105 11/27/22 0506 02/15/23 0551 02/15/23 0635 02/24/23 0435 02/25/23 0448 02/26/23 0416 03/12/23 1118  WBC 9.8  --  8.5   < > 9.4   < > 9.2 10.9* 10.7*  --   NEUTROABS 6.3  --  4.4  --  7.9*  --   --   --   --   --   HGB 10.4*   < > 10.0*   < > 12.8   < > 11.3* 10.6* 10.3* 8.6*  HCT 32.9*  --  32.0*   < > 39.6   < > 33.5* 31.5* 31.5*  --   MCV 97.9  --  101.3*   < > 100.8*   < > 97.4 95.7 96.6  --   PLT 343  --  227   < > 329   < > 363 361 366  --    < > = values in this interval not displayed.   Lab Results  Component Value Date   TSH 2.829 02/15/2023   No results found for: "HGBA1C" Lab Results  Component Value Date   CHOL 157 09/07/2020   HDL 59.30 09/07/2020   LDLCALC 74 09/07/2020   LDLDIRECT 162.1 10/28/2009   TRIG 163 (H) 02/16/2023   CHOLHDL 3 09/07/2020    Significant Diagnostic Results in last 30 days:  CT ABDOMEN PELVIS WO CONTRAST  Result Date: 02/24/2023 CLINICAL DATA:  Abdominal pain, acute, nonlocalized. Stomach distention. EXAM: CT ABDOMEN AND PELVIS WITHOUT CONTRAST TECHNIQUE:  Multidetector CT imaging of the abdomen and pelvis was performed following the standard protocol without IV contrast. RADIATION DOSE REDUCTION: This exam was performed according to the departmental dose-optimization program which includes automated exposure control, adjustment of the mA and/or kV according to patient size and/or use of iterative reconstruction technique. COMPARISON:  01/22/2012. FINDINGS: Lower chest: The heart is enlarged. There is a trace left pleural effusion. Atelectasis or scarring is noted at the lung bases bilaterally. Hepatobiliary: The left lobe of the liver has a nodular contour, suggesting underlying cirrhosis. No focal abnormality is seen in the liver. No biliary ductal dilatation. Stones are present within the gallbladder. Pancreas: Unremarkable. No pancreatic ductal dilatation or surrounding inflammatory changes. Spleen: Surgically absent. Adrenals/Urinary Tract: Adrenal glands are within normal limits. Cysts are present in the kidneys bilaterally. No renal calculus or hydronephrosis bilaterally. The visualized portion of the urinary bladder is within normal limits. Stomach/Bowel: There is a small hiatal hernia. Stomach is within normal limits. Appendix is not seen. There is diffuse  rectal wall thickening with surrounding fat stranding. Scattered diverticula are present along the colon without evidence of diverticulitis. No free air or pneumatosis. Vascular/Lymphatic: Aortic atherosclerosis. No enlarged abdominal or pelvic lymph nodes. Reproductive: Uterus and bilateral adnexa are unremarkable. Other: No abdominopelvic ascites. Musculoskeletal: Degenerative changes are present in the thoracolumbar spine. Total hip arthroplasty changes are present on the left. No acute osseous abnormality. IMPRESSION: 1. Marked rectal wall thickening with surrounding fat stranding suggesting proctitis. 2. Trace left pleural effusion with atelectasis at the lung bases. 3. Small hiatal hernia. 4.  Diverticulosis without diverticulitis. 5. Nodular contour of the left lobe of the liver suggesting cirrhosis. 6. Cholelithiasis. 7. Aortic atherosclerosis Electronically Signed   By: Thornell Sartorius M.D.   On: 02/24/2023 21:53   DG Abd 1 View  Result Date: 02/23/2023 CLINICAL DATA:  Acute abdominal pain EXAM: ABDOMEN - 1 VIEW COMPARISON:  Abdominal x-ray 09/21/2022 FINDINGS: There is gaseous distention of the stomach. Air seen throughout nondilated colon and small bowel to the level the rectum. There are no suspicious calcifications. There is dextroconvex curvature of the lumbar spine with degenerative change. Left hip arthroplasty is present. IMPRESSION: Gaseous distention of the stomach. Nonobstructive bowel gas pattern. Electronically Signed   By: Darliss Cheney M.D.   On: 02/23/2023 18:12   CT KNEE RIGHT WO CONTRAST  Result Date: 02/19/2023 CLINICAL DATA:  Knee pain EXAM: CT OF THE RIGHT KNEE WITHOUT CONTRAST TECHNIQUE: Multidetector CT imaging of the right knee was performed according to the standard protocol. Multiplanar CT image reconstructions were also generated. RADIATION DOSE REDUCTION: This exam was performed according to the departmental dose-optimization program which includes automated exposure control, adjustment of the mA and/or kV according to patient size and/or use of iterative reconstruction technique. COMPARISON:  02/18/2023 radiographs FINDINGS: Bones/Joint/Cartilage Meniscal chondrocalcinosis. Large knee joint effusion. Cortical thickening posterolaterally in the lateral tibial plateau and posterolaterally along the lateral femoral condyle, possibly degenerative although subcortical stress fractures are not excluded. There is some widening of the lateral compartment posteriorly raise the possibility of a prior compression of the lateral tibial plateau but no well-defined acute cortical fracture is identified. Tricompartmental marginal spurring noted. Ligaments Suboptimally assessed by  CT. Muscles and Tendons Unremarkable Soft tissues Moderate-sized Baker's cyst. Distal SFA and popliteal artery atheromatous vascular calcification. Subcutaneous edema anterior to the junction of the quadriceps tendon and lateral patellar retinaculum. IMPRESSION: 1. Large knee joint effusion. 2. Cortical thickening posterolaterally in the lateral tibial plateau and posterolaterally along the lateral femoral condyle, possibly degenerative although subcortical stress fractures are not excluded. There is some widening of the lateral compartment posteriorly raise the possibility of a prior compression of the lateral tibial plateau but no well-defined acute cortical fracture is identified. 3. 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 4. Moderate-sized Baker's cyst. 5. Subcutaneous edema anterior to the junction of the quadriceps tendon and lateral patellar retinaculum. 6. Distal SFA and popliteal artery atheromatous vascular calcification. Electronically Signed   By: Gaylyn Rong M.D.   On: 02/19/2023 13:33   DG Knee 1-2 Views Right  Result Date: 02/18/2023 CLINICAL DATA:  Right knee pain and swelling following fall, initial encounter EXAM: RIGHT KNEE - 2 VIEW COMPARISON:  08/02/2022 FINDINGS: Tricompartmental degenerative changes are noted. There is suggestion of mildly displaced fracture through the intercondylar eminence of the proximal tibia best seen on the lateral projection. Cross-sectional imaging may be helpful for further evaluation. Large suprapatellar joint effusion is seen. IMPRESSION: Large joint effusion with findings suspicious for proximal tibial  fracture. CT may be helpful when clinically able. Electronically Signed   By: Alcide Clever M.D.   On: 02/18/2023 22:12   US RENAL  Result Date: 02/18/2023 CLINICAL DATA:  Acute kidney injury. EXAM: RENAL / URINARY TRACT ULTRASOUND COMPLETE COMPARISON:  CT 12/08/2022, renal ultrasound 07/11/2022 FINDINGS: Right Kidney:  Renal measurements: 8.1 x 4 x 3.3 cm = volume: 55 mL. Renal parenchymal atrophy and increased echogenicity. Simple cyst measures 3.6 cm. Additional smaller cysts. No hydronephrosis. Left Kidney: Renal measurements: 7.2 x 3.3 x 3.6 cm = volume: 44 mL. Renal parenchymal atrophy and increased echogenicity. There are multiple simple cysts, which were better appreciated on prior CT. No hydronephrosis. Bladder: Only minimally distended. Appears normal for degree of bladder distention. Other: None. IMPRESSION: 1. No obstructive uropathy. 2. Renal parenchymal atrophy and findings of chronic medical renal disease. Electronically Signed   By: Narda Rutherford M.D.   On: 02/18/2023 14:02   EEG adult  Result Date: 02/15/2023 Charlsie Quest, MD     02/15/2023  4:07 PM Patient Name: [name] MRN: [id number] Epilepsy Attending: Charlsie Quest Referring Physician/Provider: Lynnell Catalan, MD Date: 02/15/2023 Duration: 22.26 mins Patient history: 82 yo F with acutely altered mental status with associated hypertensive crisis, but without visible seizure activity and only left facial droop as a consistent focal deficit. EEG to evaluate for seizure Level of alertness: Awake AEDs during EEG study: None Technical aspects: This EEG study was done with scalp electrodes positioned according to the 10-20 International system of electrode placement. Electrical activity was reviewed with band pass filter of 1-70Hz , sensitivity of 7 uV/mm, display speed of 48mm/sec with a 60Hz  notched filter applied as appropriate. EEG data were recorded continuously and digitally stored.  Video monitoring was available and reviewed as appropriate. Description: EEG showed continuous generalized predominantly 5 to 6 Hz theta slowing admixed with intermittent 2-3hz  delta slowing. Hyperventilation and photic stimulation were not performed.   ABNORMALITY - Continuous slow, generalized IMPRESSION: This study is suggestive of mild to moderate diffuse  encephalopathy. No seizures or epileptiform discharges were seen throughout the recording. Charlsie Quest   MR BRAIN WO CONTRAST  Result Date: 02/15/2023 CLINICAL DATA:  Mental status change, unknown cause. EXAM: MRI HEAD WITHOUT CONTRAST TECHNIQUE: Multiplanar, multiecho pulse sequences of the brain and surrounding structures were obtained without intravenous contrast. COMPARISON:  Head CT February 15, 2023. FINDINGS: Brain: No acute infarction, hemorrhage, extra-axial collection or mass lesion. Moderate supratentorial and prominence of the cerebral sulci related to parenchymal volume loss. Minimal chronic white matter. Vascular: Normal flow voids.  Hypoplastic vertebral. Skull and upper cervical spine: Postsurgical changes in the visualized upper cervical spine. No focal identified. Sinuses/Orbits: Mild mucosal thickening of the bilateral mastoids and ethmoid cells. Bilateral lens surgery. Other: None. IMPRESSION: 1. No acute intracranial abnormality. 2. Moderate parenchymal volume loss. Electronically Signed   By: Baldemar Lenis M.D.   On: 02/15/2023 15:22   DG Chest Port 1 View  Result Date: 02/15/2023 CLINICAL DATA:  82 year old female unresponsive patient. EXAM: PORTABLE CHEST 1 VIEW COMPARISON:  Chest x-ray 12/10/2022. FINDINGS: Lung volumes are low. No consolidative airspace disease. No pleural effusions. No pneumothorax. No evidence of pulmonary edema. Heart size is normal. Upper mediastinal contours are within normal limits. Status post median sternotomy for CABG. Atherosclerotic calcifications are noted in the thoracic aorta. IMPRESSION: 1. Low lung volumes without radiographic evidence of acute cardiopulmonary disease. 2. Aortic atherosclerosis. Electronically Signed   By: Brayton Mars.D.  On: 02/15/2023 07:33   CT HEAD WO CONTRAST  Result Date: 02/15/2023 CLINICAL DATA:  Lethargy.  Left-sided facial droop and weakness. EXAM: CT HEAD WITHOUT CONTRAST CT CERVICAL  SPINE WITHOUT CONTRAST TECHNIQUE: Multidetector CT imaging of the head and cervical spine was performed following the standard protocol without intravenous contrast. Multiplanar CT image reconstructions of the cervical spine were also generated. RADIATION DOSE REDUCTION: This exam was performed according to the departmental dose-optimization program which includes automated exposure control, adjustment of the mA and/or kV according to patient size and/or use of iterative reconstruction technique. COMPARISON:  08/02/2022 FINDINGS: CT HEAD FINDINGS Brain: No evidence of acute infarction, hemorrhage, hydrocephalus, extra-axial collection or mass lesion/mass effect. Generalized brain atrophy in keeping with age Vascular: No hyperdense vessel or unexpected calcification. Skull: Normal. Negative for fracture or focal lesion. Sinuses/Orbits: No acute finding. CT CERVICAL SPINE FINDINGS Alignment: Normal. Skull base and vertebrae: No acute fracture. No primary bone lesion or focal pathologic process. C3-C5 ACDF with solid arthrodesis Soft tissues and spinal canal: No prevertebral fluid or swelling. No visible canal hematoma. Disc levels: Disc collapse and endplate spurring especially at C5-6 to C6-7. 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 Upper chest: No visible injury IMPRESSION: No acute finding in the head or cervical spine. No specific cause for symptoms. Electronically Signed   By: Tiburcio Pea M.D.   On: 02/15/2023 06:57   CT CERVICAL SPINE WO CONTRAST  Result Date: 02/15/2023 CLINICAL DATA:  Lethargy.  Left-sided facial droop and weakness. EXAM: CT HEAD WITHOUT CONTRAST CT CERVICAL SPINE WITHOUT CONTRAST TECHNIQUE: Multidetector CT imaging of the head and cervical spine was performed following the standard protocol without intravenous contrast. Multiplanar CT image reconstructions of the cervical spine were also generated. RADIATION DOSE REDUCTION: This exam was performed according to the  departmental dose-optimization program which includes automated exposure control, adjustment of the mA and/or kV according to patient size and/or use of iterative reconstruction technique. COMPARISON:  08/02/2022 FINDINGS: CT HEAD FINDINGS Brain: No evidence of acute infarction, hemorrhage, hydrocephalus, extra-axial collection or mass lesion/mass effect. Generalized brain atrophy in keeping with age Vascular: No hyperdense vessel or unexpected calcification. Skull: Normal. Negative for fracture or focal lesion. Sinuses/Orbits: No acute finding. CT CERVICAL SPINE FINDINGS Alignment: Normal. Skull base and vertebrae: No acute fracture. No primary bone lesion or focal pathologic process. C3-C5 ACDF with solid arthrodesis Soft tissues and spinal canal: No prevertebral fluid or swelling. No visible canal hematoma. Disc levels: Disc collapse and endplate spurring especially at C5-6 to C6-7. 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 Upper chest: No visible injury IMPRESSION: No acute finding in the head or cervical spine. No specific cause for symptoms. Electronically Signed   By: Tiburcio Pea M.D.   On: 02/15/2023 06:57    Assessment/Plan Rectal prolapse Prolapsed rectum, no active bleeding, but discomfort. Apply 1% Hydrocortisone cream daily for comfort, HPOA desire colorectal surgery consultation.   Pneumonia of right lower lobe due to infectious organism PNA 03/06/2023 wbc 18.6, CXR small right base infiltrate 03/05/23, Doxycycline is completed.  Healed clinically.   Essential hypertension labile, goal Sbp 150s, on Amlodipine 10mg  every day, Coreg bid, Clonidine 0.2mg  patch, off Losartan and Torsemide.   CKD (chronic kidney disease) stage 4, GFR 15-29 ml/min (HCC) - baseline SCr 2.5. follows with Dr. Malen Gauze with [facility]  f/u nephrology, Bun/creat 121/6.8 03/13/23 peak at 5.1 in the past, negative renal US, f/u Nephrology.   Anemia Hgb 8.6 03/12/23,  8.4, Iron 106,  03/13/23, 02/26/23  10.3, 8.7 08/01/22, re- started Retacrit inj 03/05/23 Repeat CBC, BMP next lab, nephrology f/u  Urogenital candidiasis Redness, irritable, apply Nystatin cream daily.      Family/ staff Communication: plan of care reviewed with the patient, the patient's husband, and charge nurse.   Labs/tests ordered:  CBC, BMP  Time spend 30 minutes.

## 2023-03-13 NOTE — Patient Instructions (Signed)
Visit Information  Thank you for taking time to visit with me today. Please don't hesitate to contact me if I can be of assistance to you.   Following are the goals we discussed today:   Goals Addressed             This Visit's Progress    patient needs help with ADLs and daily care needs       Interventions:  LCSW spoke via phone today with Lanell Persons, son of client, about client needs. LCSW spoke also via phone today with [name] about her needs and status Client is currently receiving care at [facility] facility. She and her son, Loraine Leriche, are communicating with SW at SNF facility to determine discharge plans for client Client has previously lived at independently living facility with her spouse in [place], [place] Client needs help with ADLs, such as bathing, dressing , meal preparation Freda Morse, son, reported that he lives in [place], [place] and that another sibling lives in [place], [place]. Loraine Leriche said he thinks client may need more assistance if she returns to independent living level  Lanell Persons and client are working with SW at current client facility to determine care needs of client, daily ADLs assistance needs of client, fall risk of client, medication assistance needs of client Loraine Leriche has said that client has decreased energy level at present Loraine Leriche has said that when client was with her spouse in independent living that they had person who helped with home cleaning for them one time per month LCSW and client spoke of care support at independent living facility when client returns there. She said independent living  facility has care givers that they can provide (fee for services) to help client in that environment as needed LCSW encouraged client and Loraine Leriche to talk with SW at current SNF facility to do active discharge planning for client as needed  Discussed client needs regarding daily ADLs Client said she can take a bath and dress adequately. Discussed whether or not client may  need some ADLs help when she goes back to Independent living level [facility] [facility] client and Bayler Mister LCSW name and phone number and encouraged both to call LCSW as needed for SW support for client       Client and Antoinett Murnahan , son of client, have LCSW name and phone number and can call LCSW as needed for SW support for client  Please call the care guide team at [phone] if you need to cancel or reschedule your appointment.   If you are experiencing a Mental Health or Behavioral Health Crisis or need someone to talk to, please go to [facility] [address], [place] [phone])   The patient verbalized understanding of instructions, educational materials, and care plan provided today and DECLINED offer to receive copy of patient instructions, educational materials, and care plan.   The patient has been provided with contact information for the care management team and has been advised to call with any health related questions or concerns.   Kelton Pillar.Mirai Greenwood MSW, LCSW Licensed Visual merchandiser [facility] Care Management [phone]

## 2023-03-13 NOTE — Assessment & Plan Note (Signed)
Redness, irritable, apply Nystatin cream daily.

## 2023-03-13 NOTE — Progress Notes (Signed)
This encounter was created in error - please disregard.

## 2023-03-13 NOTE — Assessment & Plan Note (Signed)
Hgb 8.6 03/12/23,  8.4, Iron 106, 03/13/23, 02/26/23 10.3, 8.7 08/01/22, re- started Retacrit inj 03/05/23 Repeat CBC, BMP next lab, nephrology f/u

## 2023-03-13 NOTE — Assessment & Plan Note (Signed)
PNA 03/06/2023 wbc 18.6, CXR small right base infiltrate 03/05/23, Doxycycline is completed.  Healed clinically.

## 2023-03-15 DIAGNOSIS — K623 Rectal prolapse: Secondary | ICD-10-CM | POA: Diagnosis not present

## 2023-03-15 DIAGNOSIS — Z515 Encounter for palliative care: Secondary | ICD-10-CM | POA: Diagnosis not present

## 2023-03-15 DIAGNOSIS — J9601 Acute respiratory failure with hypoxia: Secondary | ICD-10-CM | POA: Diagnosis not present

## 2023-03-15 DIAGNOSIS — N184 Chronic kidney disease, stage 4 (severe): Secondary | ICD-10-CM | POA: Diagnosis not present

## 2023-03-16 ENCOUNTER — Inpatient Hospital Stay (HOSPITAL_COMMUNITY)
Admission: EM | Admit: 2023-03-16 | Discharge: 2023-03-19 | DRG: 190 | Disposition: A | Discharge status: Skilled Nursing Facility | Payer: Medicare PPO | Source: Skilled Nursing Facility | Attending: Internal Medicine | Admitting: Internal Medicine

## 2023-03-16 ENCOUNTER — Inpatient Hospital Stay (HOSPITAL_COMMUNITY): Payer: Medicare PPO

## 2023-03-16 ENCOUNTER — Emergency Department (HOSPITAL_COMMUNITY): Payer: Medicare PPO

## 2023-03-16 ENCOUNTER — Other Ambulatory Visit: Payer: Self-pay

## 2023-03-16 ENCOUNTER — Encounter: Payer: Self-pay | Admitting: Sports Medicine

## 2023-03-16 ENCOUNTER — Non-Acute Institutional Stay (SKILLED_NURSING_FACILITY): Payer: Self-pay | Admitting: Sports Medicine

## 2023-03-16 DIAGNOSIS — I959 Hypotension, unspecified: Secondary | ICD-10-CM | POA: Diagnosis not present

## 2023-03-16 DIAGNOSIS — M25561 Pain in right knee: Secondary | ICD-10-CM | POA: Diagnosis not present

## 2023-03-16 DIAGNOSIS — E785 Hyperlipidemia, unspecified: Secondary | ICD-10-CM | POA: Diagnosis present

## 2023-03-16 DIAGNOSIS — N186 End stage renal disease: Secondary | ICD-10-CM | POA: Diagnosis not present

## 2023-03-16 DIAGNOSIS — Z7902 Long term (current) use of antithrombotics/antiplatelets: Secondary | ICD-10-CM

## 2023-03-16 DIAGNOSIS — I252 Old myocardial infarction: Secondary | ICD-10-CM

## 2023-03-16 DIAGNOSIS — I120 Hypertensive chronic kidney disease with stage 5 chronic kidney disease or end stage renal disease: Secondary | ICD-10-CM | POA: Diagnosis not present

## 2023-03-16 DIAGNOSIS — Z833 Family history of diabetes mellitus: Secondary | ICD-10-CM

## 2023-03-16 DIAGNOSIS — I672 Cerebral atherosclerosis: Secondary | ICD-10-CM | POA: Diagnosis not present

## 2023-03-16 DIAGNOSIS — M069 Rheumatoid arthritis, unspecified: Secondary | ICD-10-CM | POA: Diagnosis present

## 2023-03-16 DIAGNOSIS — R410 Disorientation, unspecified: Secondary | ICD-10-CM | POA: Diagnosis not present

## 2023-03-16 DIAGNOSIS — Z7189 Other specified counseling: Secondary | ICD-10-CM | POA: Diagnosis not present

## 2023-03-16 DIAGNOSIS — D631 Anemia in chronic kidney disease: Secondary | ICD-10-CM | POA: Diagnosis present

## 2023-03-16 DIAGNOSIS — M25569 Pain in unspecified knee: Secondary | ICD-10-CM | POA: Diagnosis present

## 2023-03-16 DIAGNOSIS — R0602 Shortness of breath: Principal | ICD-10-CM

## 2023-03-16 DIAGNOSIS — E8721 Acute metabolic acidosis: Secondary | ICD-10-CM | POA: Diagnosis present

## 2023-03-16 DIAGNOSIS — R7989 Other specified abnormal findings of blood chemistry: Secondary | ICD-10-CM | POA: Diagnosis not present

## 2023-03-16 DIAGNOSIS — N179 Acute kidney failure, unspecified: Secondary | ICD-10-CM | POA: Diagnosis not present

## 2023-03-16 DIAGNOSIS — Z8673 Personal history of transient ischemic attack (TIA), and cerebral infarction without residual deficits: Secondary | ICD-10-CM

## 2023-03-16 DIAGNOSIS — M85861 Other specified disorders of bone density and structure, right lower leg: Secondary | ICD-10-CM | POA: Diagnosis not present

## 2023-03-16 DIAGNOSIS — R609 Edema, unspecified: Secondary | ICD-10-CM | POA: Diagnosis not present

## 2023-03-16 DIAGNOSIS — I5042 Chronic combined systolic (congestive) and diastolic (congestive) heart failure: Secondary | ICD-10-CM | POA: Diagnosis present

## 2023-03-16 DIAGNOSIS — I2510 Atherosclerotic heart disease of native coronary artery without angina pectoris: Secondary | ICD-10-CM | POA: Diagnosis not present

## 2023-03-16 DIAGNOSIS — R600 Localized edema: Secondary | ICD-10-CM | POA: Diagnosis not present

## 2023-03-16 DIAGNOSIS — Z79890 Hormone replacement therapy (postmenopausal): Secondary | ICD-10-CM

## 2023-03-16 DIAGNOSIS — M25461 Effusion, right knee: Secondary | ICD-10-CM | POA: Diagnosis not present

## 2023-03-16 DIAGNOSIS — Z87891 Personal history of nicotine dependence: Secondary | ICD-10-CM

## 2023-03-16 DIAGNOSIS — N185 Chronic kidney disease, stage 5: Secondary | ICD-10-CM

## 2023-03-16 DIAGNOSIS — M1990 Unspecified osteoarthritis, unspecified site: Secondary | ICD-10-CM | POA: Diagnosis present

## 2023-03-16 DIAGNOSIS — Z951 Presence of aortocoronary bypass graft: Secondary | ICD-10-CM

## 2023-03-16 DIAGNOSIS — R1084 Generalized abdominal pain: Secondary | ICD-10-CM | POA: Diagnosis not present

## 2023-03-16 DIAGNOSIS — Z1152 Encounter for screening for COVID-19: Secondary | ICD-10-CM | POA: Diagnosis not present

## 2023-03-16 DIAGNOSIS — E871 Hypo-osmolality and hyponatremia: Secondary | ICD-10-CM | POA: Diagnosis not present

## 2023-03-16 DIAGNOSIS — Z888 Allergy status to other drugs, medicaments and biological substances status: Secondary | ICD-10-CM

## 2023-03-16 DIAGNOSIS — E039 Hypothyroidism, unspecified: Secondary | ICD-10-CM | POA: Diagnosis present

## 2023-03-16 DIAGNOSIS — Z86718 Personal history of other venous thrombosis and embolism: Secondary | ICD-10-CM

## 2023-03-16 DIAGNOSIS — G9341 Metabolic encephalopathy: Secondary | ICD-10-CM | POA: Diagnosis not present

## 2023-03-16 DIAGNOSIS — J9601 Acute respiratory failure with hypoxia: Secondary | ICD-10-CM | POA: Diagnosis not present

## 2023-03-16 DIAGNOSIS — J441 Chronic obstructive pulmonary disease with (acute) exacerbation: Principal | ICD-10-CM | POA: Diagnosis present

## 2023-03-16 DIAGNOSIS — Z885 Allergy status to narcotic agent status: Secondary | ICD-10-CM

## 2023-03-16 DIAGNOSIS — R0902 Hypoxemia: Secondary | ICD-10-CM | POA: Diagnosis not present

## 2023-03-16 DIAGNOSIS — R531 Weakness: Secondary | ICD-10-CM | POA: Diagnosis not present

## 2023-03-16 DIAGNOSIS — N184 Chronic kidney disease, stage 4 (severe): Secondary | ICD-10-CM | POA: Diagnosis not present

## 2023-03-16 DIAGNOSIS — R0600 Dyspnea, unspecified: Secondary | ICD-10-CM | POA: Diagnosis not present

## 2023-03-16 DIAGNOSIS — Z9081 Acquired absence of spleen: Secondary | ICD-10-CM

## 2023-03-16 DIAGNOSIS — Z789 Other specified health status: Secondary | ICD-10-CM | POA: Diagnosis not present

## 2023-03-16 DIAGNOSIS — Z83438 Family history of other disorder of lipoprotein metabolism and other lipidemia: Secondary | ICD-10-CM

## 2023-03-16 DIAGNOSIS — Z66 Do not resuscitate: Secondary | ICD-10-CM | POA: Diagnosis present

## 2023-03-16 DIAGNOSIS — D649 Anemia, unspecified: Secondary | ICD-10-CM | POA: Diagnosis not present

## 2023-03-16 DIAGNOSIS — Z823 Family history of stroke: Secondary | ICD-10-CM

## 2023-03-16 DIAGNOSIS — Z8249 Family history of ischemic heart disease and other diseases of the circulatory system: Secondary | ICD-10-CM

## 2023-03-16 DIAGNOSIS — R64 Cachexia: Secondary | ICD-10-CM | POA: Diagnosis present

## 2023-03-16 DIAGNOSIS — F419 Anxiety disorder, unspecified: Secondary | ICD-10-CM | POA: Diagnosis present

## 2023-03-16 DIAGNOSIS — J90 Pleural effusion, not elsewhere classified: Secondary | ICD-10-CM | POA: Diagnosis not present

## 2023-03-16 DIAGNOSIS — R918 Other nonspecific abnormal finding of lung field: Secondary | ICD-10-CM | POA: Diagnosis not present

## 2023-03-16 DIAGNOSIS — G8929 Other chronic pain: Secondary | ICD-10-CM | POA: Diagnosis present

## 2023-03-16 DIAGNOSIS — Z79899 Other long term (current) drug therapy: Secondary | ICD-10-CM

## 2023-03-16 DIAGNOSIS — Z8679 Personal history of other diseases of the circulatory system: Secondary | ICD-10-CM

## 2023-03-16 DIAGNOSIS — M109 Gout, unspecified: Secondary | ICD-10-CM | POA: Diagnosis present

## 2023-03-16 DIAGNOSIS — Z886 Allergy status to analgesic agent status: Secondary | ICD-10-CM

## 2023-03-16 DIAGNOSIS — G4733 Obstructive sleep apnea (adult) (pediatric): Secondary | ICD-10-CM | POA: Diagnosis not present

## 2023-03-16 DIAGNOSIS — Z6823 Body mass index (BMI) 23.0-23.9, adult: Secondary | ICD-10-CM

## 2023-03-16 DIAGNOSIS — M1711 Unilateral primary osteoarthritis, right knee: Secondary | ICD-10-CM | POA: Diagnosis not present

## 2023-03-16 DIAGNOSIS — Z515 Encounter for palliative care: Secondary | ICD-10-CM

## 2023-03-16 DIAGNOSIS — Z96642 Presence of left artificial hip joint: Secondary | ICD-10-CM | POA: Diagnosis present

## 2023-03-16 DIAGNOSIS — Z860101 Personal history of adenomatous and serrated colon polyps: Secondary | ICD-10-CM

## 2023-03-16 DIAGNOSIS — Z86711 Personal history of pulmonary embolism: Secondary | ICD-10-CM

## 2023-03-16 DIAGNOSIS — M353 Polymyalgia rheumatica: Secondary | ICD-10-CM | POA: Diagnosis present

## 2023-03-16 LAB — CBC WITH DIFFERENTIAL/PLATELET
Abs Immature Granulocytes: 0.1 10*3/uL — ABNORMAL HIGH (ref 0.00–0.07)
Basophils Absolute: 0.1 10*3/uL (ref 0.0–0.1)
Basophils Relative: 1 %
Eosinophils Absolute: 0.1 10*3/uL (ref 0.0–0.5)
Eosinophils Relative: 1 %
HCT: 23.6 % — ABNORMAL LOW (ref 36.0–46.0)
Hemoglobin: 7.8 g/dL — ABNORMAL LOW (ref 12.0–15.0)
Immature Granulocytes: 1 %
Lymphocytes Relative: 8 %
Lymphs Abs: 0.9 10*3/uL (ref 0.7–4.0)
MCH: 31.7 pg (ref 26.0–34.0)
MCHC: 33.1 g/dL (ref 30.0–36.0)
MCV: 95.9 fL (ref 80.0–100.0)
Monocytes Absolute: 1.6 10*3/uL — ABNORMAL HIGH (ref 0.1–1.0)
Monocytes Relative: 16 %
Neutro Abs: 7.7 10*3/uL (ref 1.7–7.7)
Neutrophils Relative %: 73 %
Platelets: 242 10*3/uL (ref 150–400)
RBC: 2.46 MIL/uL — ABNORMAL LOW (ref 3.87–5.11)
RDW: 17.7 % — ABNORMAL HIGH (ref 11.5–15.5)
WBC: 10.3 10*3/uL (ref 4.0–10.5)
nRBC: 0.3 % — ABNORMAL HIGH (ref 0.0–0.2)

## 2023-03-16 LAB — COMPREHENSIVE METABOLIC PANEL
ALT: 16 U/L (ref 0–44)
AST: 21 U/L (ref 15–41)
Albumin: 2.2 g/dL — ABNORMAL LOW (ref 3.5–5.0)
Alkaline Phosphatase: 69 U/L (ref 38–126)
Anion gap: 18 — ABNORMAL HIGH (ref 5–15)
BUN: 136 mg/dL — ABNORMAL HIGH (ref 8–23)
CO2: 12 mmol/L — ABNORMAL LOW (ref 22–32)
Calcium: 7.4 mg/dL — ABNORMAL LOW (ref 8.9–10.3)
Chloride: 102 mmol/L (ref 98–111)
Creatinine, Ser: 7.82 mg/dL — ABNORMAL HIGH (ref 0.44–1.00)
GFR, Estimated: 5 mL/min — ABNORMAL LOW (ref >60)
Glucose, Bld: 113 mg/dL — ABNORMAL HIGH (ref 70–99)
Potassium: 3.5 mmol/L (ref 3.5–5.1)
Sodium: 132 mmol/L — ABNORMAL LOW (ref 135–145)
Total Bilirubin: 0.9 mg/dL (ref <1.2)
Total Protein: 4.7 g/dL — ABNORMAL LOW (ref 6.5–8.1)

## 2023-03-16 LAB — TROPONIN I (HIGH SENSITIVITY)
Troponin I (High Sensitivity): 80 ng/L — ABNORMAL HIGH (ref <18)
Troponin I (High Sensitivity): 75 ng/L — ABNORMAL HIGH (ref <18)

## 2023-03-16 LAB — RESPIRATORY PANEL BY PCR

## 2023-03-16 LAB — IRON AND TIBC
Iron: 19 ug/dL — ABNORMAL LOW (ref 28–170)
Saturation Ratios: 13 % (ref 10.4–31.8)
TIBC: 147 ug/dL — ABNORMAL LOW (ref 250–450)
UIBC: 128 ug/dL

## 2023-03-16 LAB — TYPE AND SCREEN
ABO/RH(D): O POS
Antibody Screen: NEGATIVE

## 2023-03-16 LAB — T4, FREE: Free T4: 1.07 ng/dL (ref 0.61–1.12)

## 2023-03-16 LAB — PHOSPHORUS: Phosphorus: 10 mg/dL — ABNORMAL HIGH (ref 2.5–4.6)

## 2023-03-16 LAB — RETICULOCYTES
Immature Retic Fract: 4.6 % (ref 2.3–15.9)
RBC.: 2.5 MIL/uL — ABNORMAL LOW (ref 3.87–5.11)
Retic Count, Absolute: 77 10*3/uL (ref 19.0–186.0)
Retic Ct Pct: 3.1 % (ref 0.4–3.1)

## 2023-03-16 LAB — AMMONIA: Ammonia: <10 umol/L (ref 9–35)

## 2023-03-16 LAB — MAGNESIUM: Magnesium: 1 mg/dL — ABNORMAL LOW (ref 1.7–2.4)

## 2023-03-16 LAB — LACTIC ACID, PLASMA
Lactic Acid, Venous: 0.8 mmol/L (ref 0.5–1.9)
Lactic Acid, Venous: 0.6 mmol/L (ref 0.5–1.9)

## 2023-03-16 LAB — OSMOLALITY: Osmolality: 326 mosm/kg (ref 275–295)

## 2023-03-16 LAB — CK: Total CK: 78 U/L (ref 38–234)

## 2023-03-16 LAB — FERRITIN: Ferritin: 489 ng/mL — ABNORMAL HIGH (ref 11–307)

## 2023-03-16 LAB — BRAIN NATRIURETIC PEPTIDE: B Natriuretic Peptide: 783.2 pg/mL — ABNORMAL HIGH (ref 0.0–100.0)

## 2023-03-16 LAB — PROCALCITONIN: Procalcitonin: 0.52 ng/mL

## 2023-03-16 LAB — TSH: TSH: 1.27 u[IU]/mL (ref 0.350–4.500)

## 2023-03-16 LAB — SARS CORONAVIRUS 2 BY RT PCR: SARS Coronavirus 2 by RT PCR: NEGATIVE

## 2023-03-16 MED ORDER — ALLOPURINOL 100 MG PO TABS
100.0000 mg | ORAL_TABLET | Freq: Every day | ORAL | Status: DC
  Administered 2023-03-16 – 2023-03-18 (×3): 100 mg via ORAL
  Filled 2023-03-16 (×3): qty 1

## 2023-03-16 MED ORDER — ONDANSETRON HCL 4 MG PO TABS: 4.0000 mg | ORAL_TABLET | Freq: Four times a day (QID) | ORAL | Status: DC | PRN

## 2023-03-16 MED ORDER — ALBUTEROL SULFATE (2.5 MG/3ML) 0.083% IN NEBU
2.5000 mg | INHALATION_SOLUTION | RESPIRATORY_TRACT | Status: DC | PRN
  Administered 2023-03-19: 2.5 mg via RESPIRATORY_TRACT
  Filled 2023-03-16: qty 3

## 2023-03-16 MED ORDER — HYDROCODONE-ACETAMINOPHEN 5-325 MG PO TABS: 1.0000 | ORAL_TABLET | ORAL | Status: DC | PRN

## 2023-03-16 MED ORDER — ONDANSETRON HCL 4 MG/2ML IJ SOLN
4.0000 mg | Freq: Four times a day (QID) | INTRAMUSCULAR | Status: DC | PRN
  Administered 2023-03-18: 4 mg via INTRAVENOUS
  Filled 2023-03-16: qty 2

## 2023-03-16 MED ORDER — ACETAMINOPHEN 650 MG RE SUPP
650.0000 mg | Freq: Four times a day (QID) | RECTAL | Status: DC | PRN
Start: 2023-03-16 — End: 2023-03-19

## 2023-03-16 MED ORDER — IPRATROPIUM-ALBUTEROL 0.5-2.5 (3) MG/3ML IN SOLN
9.0000 mL | Freq: Once | RESPIRATORY_TRACT | Status: AC
  Administered 2023-03-16: 9 mL via RESPIRATORY_TRACT
  Filled 2023-03-16: qty 3

## 2023-03-16 MED ORDER — IPRATROPIUM-ALBUTEROL 0.5-2.5 (3) MG/3ML IN SOLN
3.0000 mL | Freq: Four times a day (QID) | RESPIRATORY_TRACT | Status: DC
  Administered 2023-03-16 – 2023-03-17 (×3): 3 mL via RESPIRATORY_TRACT
  Filled 2023-03-16 (×4): qty 3

## 2023-03-16 MED ORDER — LEFLUNOMIDE 10 MG PO TABS
20.0000 mg | ORAL_TABLET | Freq: Every day | ORAL | Status: DC
  Administered 2023-03-18 – 2023-03-19 (×2): 20 mg via ORAL
  Filled 2023-03-16 (×3): qty 2

## 2023-03-16 MED ORDER — DOCUSATE SODIUM 100 MG PO CAPS: 100.0000 mg | ORAL_CAPSULE | Freq: Two times a day (BID) | ORAL | Status: DC | PRN

## 2023-03-16 MED ORDER — PANTOPRAZOLE SODIUM 40 MG PO TBEC
40.0000 mg | DELAYED_RELEASE_TABLET | Freq: Every day | ORAL | Status: DC
  Administered 2023-03-16 – 2023-03-18 (×3): 40 mg via ORAL
  Filled 2023-03-16 (×3): qty 1

## 2023-03-16 MED ORDER — PREDNISONE 20 MG PO TABS
40.0000 mg | ORAL_TABLET | Freq: Every day | ORAL | Status: DC
  Administered 2023-03-18 – 2023-03-19 (×2): 40 mg via ORAL
  Filled 2023-03-16 (×2): qty 2

## 2023-03-16 MED ORDER — SERTRALINE HCL 25 MG PO TABS
25.0000 mg | ORAL_TABLET | Freq: Every day | ORAL | Status: DC
  Administered 2023-03-16 – 2023-03-19 (×4): 25 mg via ORAL
  Filled 2023-03-16 (×4): qty 1

## 2023-03-16 MED ORDER — CARVEDILOL 6.25 MG PO TABS
6.2500 mg | ORAL_TABLET | Freq: Two times a day (BID) | ORAL | Status: DC
  Administered 2023-03-17 – 2023-03-18 (×3): 6.25 mg via ORAL
  Filled 2023-03-16: qty 2
  Filled 2023-03-16 (×2): qty 1

## 2023-03-16 MED ORDER — SODIUM CHLORIDE 0.9 % IV SOLN
1.0000 g | INTRAVENOUS | Status: DC
  Administered 2023-03-16: 1 g via INTRAVENOUS
  Filled 2023-03-16: qty 10

## 2023-03-16 MED ORDER — CLOPIDOGREL BISULFATE 75 MG PO TABS
75.0000 mg | ORAL_TABLET | ORAL | Status: DC
  Administered 2023-03-16 – 2023-03-18 (×2): 75 mg via ORAL
  Filled 2023-03-16 (×2): qty 1

## 2023-03-16 MED ORDER — ATORVASTATIN CALCIUM 80 MG PO TABS
80.0000 mg | ORAL_TABLET | Freq: Every day | ORAL | Status: DC
  Administered 2023-03-16 – 2023-03-18 (×3): 80 mg via ORAL
  Filled 2023-03-16: qty 2
  Filled 2023-03-16 (×2): qty 1

## 2023-03-16 MED ORDER — METHYLPREDNISOLONE SODIUM SUCC 40 MG IJ SOLR
40.0000 mg | Freq: Two times a day (BID) | INTRAMUSCULAR | Status: AC
  Administered 2023-03-17 (×2): 40 mg via INTRAVENOUS
  Filled 2023-03-16 (×2): qty 1

## 2023-03-16 MED ORDER — BISACODYL 5 MG PO TBEC: 10.0000 mg | DELAYED_RELEASE_TABLET | Freq: Every day | ORAL | Status: DC | PRN

## 2023-03-16 MED ORDER — ALBUTEROL SULFATE (2.5 MG/3ML) 0.083% IN NEBU: 2.5000 mg | INHALATION_SOLUTION | RESPIRATORY_TRACT | Status: DC | PRN

## 2023-03-16 MED ORDER — METHYLPREDNISOLONE SODIUM SUCC 125 MG IJ SOLR
125.0000 mg | Freq: Once | INTRAMUSCULAR | Status: AC
  Administered 2023-03-16: 125 mg via INTRAVENOUS
  Filled 2023-03-16: qty 2

## 2023-03-16 MED ORDER — LEVOTHYROXINE SODIUM 88 MCG PO TABS
88.0000 ug | ORAL_TABLET | Freq: Every day | ORAL | Status: DC
  Administered 2023-03-17 – 2023-03-18 (×2): 88 ug via ORAL
  Filled 2023-03-16 (×2): qty 1

## 2023-03-16 MED ORDER — ACETAMINOPHEN 325 MG PO TABS: 650.0000 mg | ORAL_TABLET | Freq: Four times a day (QID) | ORAL | Status: DC | PRN

## 2023-03-16 NOTE — Assessment & Plan Note (Signed)
this patient has acute respiratory failure with Hypoxia  as documented by the presence of following: O2 saturatio< 90% on RA Likely due to:  COPD exacerbation, Provide O2 therapy and titrate as needed  Continuous pulse ox   check Pulse ox with ambulation prior to discharge    flutter valve ordered

## 2023-03-16 NOTE — ED Provider Notes (Signed)
[place] EMERGENCY DEPARTMENT AT [facility] Provider Note   CSN: [id number] Arrival date & time: 03/16/23  1402     History Chief Complaint  Patient presents with   Shortness of Breath    HPI [name] is a 82 y.o. female presenting for chief complaint of shortness of breath.  She is a 82 year old female [facility].. Recently discharged from the hospital for hypertensive crisis.  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  She presented with shortness of breath. She was started on 2 L oxygen recently for end-stage COPD.  She was satting 82% on that 2 L oxygen, [facility] for further care and management.  Patient's recorded medical, surgical, social, medication list and allergies were reviewed in the Snapshot window as part of the initial history.   Review of Systems   Review of Systems  Constitutional:  Negative for chills and fever.  HENT:  Positive for congestion. Negative for ear pain and sore throat.   Eyes:  Negative for pain and visual disturbance.  Respiratory:  Positive for cough and shortness of breath.   Cardiovascular:  Negative for chest pain and palpitations.  Gastrointestinal:  Negative for abdominal pain and vomiting.  Genitourinary:  Negative for dysuria and hematuria.  Musculoskeletal:  Negative for arthralgias and back pain.  Skin:  Negative for color change and rash.  Neurological:  Negative for seizures and syncope.  All other systems reviewed and are negative.   Physical Exam Updated Vital Signs BP 128/72 (BP Location: Left Arm)   Pulse 73   Temp 97.6 F (36.4 C) (Oral)   Resp (!) 22   Ht 5\' 3"  (1.6 m)   Wt 58.9 kg   SpO2 98%   BMI 23.00 kg/m  Physical Exam Constitutional:      General: She is not in acute distress.    Appearance: She is not  ill-appearing or toxic-appearing.  HENT:     Head: Normocephalic and atraumatic.  Eyes:     Extraocular Movements: Extraocular movements intact.     Pupils: Pupils are equal, round, and reactive to light.  Cardiovascular:     Rate and Rhythm: Normal rate.  Pulmonary:     Effort: No respiratory distress.     Breath sounds: Decreased breath sounds and wheezing present.  Abdominal:     General: Abdomen is flat.  Musculoskeletal:        General: No swelling, deformity or signs of injury.     Cervical back: Normal range of motion. No rigidity.  Skin:    General: Skin is warm and dry.  Neurological:     General: No focal deficit present.     Mental Status: She is alert and oriented to person, place, and time.  Psychiatric:        Mood and Affect: Mood normal.      ED Course/ Medical Decision Making/ A&P    Procedures Procedures   Medications Ordered in ED Medications  ipratropium-albuterol (DUONEB) 0.5-2.5 (3) MG/3ML nebulizer solution 9 mL (has no administration in time range)  methylPREDNISolone sodium succinate (SOLU-MEDROL) 125 mg/2 mL injection 125 mg (has no administration in time range)   Medical Decision Making:   [name] is a 82 y.o. female with a history of COPD and multiple other conditions, who presented to the ED today with acute on  chronic SOB. They are endorsing worsening of their baseline dyspnea over the past 48 hours. Their baseline is a 2L O2 requirement. At their baseline they are able to get around the facility they are not able to at this time.   On my initial exam, the pt was SOB and tachypneic. Audible wheezing and grossly decreased breath sounds appreciated.  They are endorsing increased sputum production.    Reviewed and confirmed nursing documentation for past medical history, family history, social history.    Initial Assessment:   With the patient's presentation of SOB in the above setting, most likely diagnosis is COPD Exacerbation. Other  diagnoses were considered including (but not limited to) CAP, PE, ACS, viral infection, PTX. These are considered less likely due to history of present illness and physical exam findings.   This is most consistent with an acute life/limb threatening illness complicated by underlying chronic conditions.  Initial Plan:  Empiric treatment of patient's symptoms with immediate initiation of inhaled bronchodilators and IV steroids.  Evaluation for ACS with EKG and delta troponin  Evaluation for infectious versus intrathoracic abnormality with chest x-ray  Evaluation for volume overload with BNP  Screening labs including CBC and Metabolic panel to evaluate for infectious or metabolic etiology of disease.  Patient's Wells score is low and patient does not warrant further objective evaluation for PE based on consistency of presentation of alternative diagnosis.  Patient presented just prior to the end of shift.  She was able to rapidly be de-escalated from a nonrebreather to nasal cannula oxygen and satting in the low 90s which is appropriate for her COPD. Workup of COPD exacerbation initiated as above and patient will be handed off to oncoming team for reassessment of this evaluation.     Clinical Impression:  1. SOB (shortness of breath)      Data Unavailable   Final Clinical Impression(s) / ED Diagnoses Final diagnoses:  SOB (shortness of breath)    Rx / DC Orders ED Discharge Orders     None         Glyn Ade, MD 03/16/23 1445

## 2023-03-16 NOTE — Assessment & Plan Note (Signed)
Check TSH continue Synthroid at 88 mcg a day

## 2023-03-16 NOTE — Assessment & Plan Note (Signed)
Continue to monitor if HG < 7 can discuss with family /pt if they want blood transfusion  Over all poor prognosis

## 2023-03-16 NOTE — Assessment & Plan Note (Signed)
Obtain dopplers of Bilateral legs Right >LEft

## 2023-03-16 NOTE — Consult Note (Signed)
[facility]  INPATIENT CONSULTATION  Reason for Consultation: CKD Requesting Provider: Dr. Jarold Motto  HPI: [name] is an 82 y.o. 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   Recently admitted HTN urgency 10/17-28. Discharged to SNF.   Cr in the ~5 range during admission.  Presented to outpt nephrologist Dr. Malen Gauze following hospitalization and stated she desired conservative management of ESRD.  Palliative care was consulted and has had phone consult with family (son).   She presents to [facility] today from her SNF [facility] with dyspnea, productive cough.  Afebirle BPs in the 90-120s HR 80s. O2 sat mid 90s on NRB.  Recently started 2L [place] for COPD.  COVID neg.   Labs showing N132, K 3.5, Bicarb 12, BUN 136, Cr 7.8, WBC 10.3, Hb 7.8 (from 10.3 on 10/28 and 8.6 on 11/11).  Port CXR showing trace effusions, streaky L basilar consolidation likely atelectasis or scarring.  Receive some albuterol and had improvement.  Back to 2L [place] with O2 sat 96%.  Being admitted.   When I saw her she was alone in room, arousable, conversant and oriented.  She confirmed no dialysis ever.  Endorses dysgeusia and poor appetite.  LE edema stable.  She had no other complaints.  She's being admitted.    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 Most likely scar. Mild subendocardial ischemia. EF 71% LOW RISK.    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 Kellie Simmering   Pulmonary emboli [facility]) 02/06/2022   01/2022 PE/DVT post-op. Lower GI bleed     Treat edema  Start Eliquis at the reduced dose due to Creat>1.5 and age >57   Rheumatoid arthritis (HCC) 2011   Dr.Truslow; fracture knees, hands and wrists -    S/P CABG x 1 1996   CABG--LIMA-LAD for ostial LAD (not felt to be PCI amenable). EF NORMAL then; LIMA now atretic   Shortness of breath dyspnea    with exertion   Stroke (HCC) 11/2014   TIA    Urinary frequency    PSH:  Past Surgical History:  Procedure Laterality Date   ABDOMINAL AORTIC ANEURYSM REPAIR  1996   Complicated by mesenteric artery stenosis and splenic artery infarction with acquired Asplenia   APPENDECTOMY     BIOPSY  07/08/2022   Procedure: BIOPSY;  Surgeon: Benancio Deeds, MD;  Location[facility] ENDOSCOPY;  Service: Gastroenterology;;   BIOPSY  11/28/2022   Procedure: BIOPSY;  Surgeon: [name], MD;  Location: [facility] ENDOSCOPY;  Service: Gastroenterology;;   [name]  07/2011   right foot   CARDIAC CATHETERIZATION  2005   (Most recent CATH) - ostial LAD lesion 20-30% (down from 90% initially). Atretic LIMA. Minimal disease the RCA and Circumflex system.   CARPAL TUNNEL RELEASE Left    CATARACT EXTRACTION Bilateral    CERVICAL SPINE SURGERY      plate 1610 Dr. Wynetta Emery   COLONOSCOPY     COLONOSCOPY WITH PROPOFOL N/A 11/28/2022   Procedure: COLONOSCOPY WITH PROPOFOL;  Surgeon: [name], MD;  Location: [facility] ENDOSCOPY;  Service: Gastroenterology;  Laterality: N/A;   CORONARY ARTERY BYPASS GRAFT  1996   INCLUDED AN INTERNAL MAMMARY ARTERY TO THE LAD. EF WAS NORMAL   ESOPHAGOGASTRODUODENOSCOPY (EGD) WITH PROPOFOL N/A 07/08/2022   Procedure: ESOPHAGOGASTRODUODENOSCOPY (EGD) WITH PROPOFOL;  Surgeon: Benancio Deeds, MD;  Location: [facility] ENDOSCOPY;  Service: Gastroenterology;  Laterality: N/A;   ESOPHAGOGASTRODUODENOSCOPY (EGD) WITH PROPOFOL N/A 11/28/2022   Procedure: ESOPHAGOGASTRODUODENOSCOPY (EGD) WITH PROPOFOL;  Surgeon: [name], MD;  Location: [facility] ENDOSCOPY;  Service: Gastroenterology;  Laterality: N/A;   INGUINAL HERNIA REPAIR Right    LAPAROSCOPIC APPENDECTOMY N/A 06/28/2016   Procedure: APPENDECTOMY LAPAROSCOPIC;  Surgeon: [name], MD;  Location: [facility] ORS;  Service: General;  Laterality: N/A;   NM MYOVIEW LTD  06/2010   Fixed anteroseptal, apical and inferoapical defect with moderate size. Most likely scar. Mild subendocardial ischemia. EF 71% LOW RISK.    POLYPECTOMY  11/28/2022   Procedure: POLYPECTOMY;  Surgeon: [name], MD;  Location: [facility] ENDOSCOPY;  Service: Gastroenterology;;   SPLENECTOMY     TOTAL HIP ARTHROPLASTY Left 01/23/2022   Procedure: LEFT TOTAL HIP ARTHROPLASTY ANTERIOR APPROACH;  Surgeon: [name], MD;  Location: [facility] OR;  Service: Orthopedics;  Laterality: Left;  3-C   TRANSTHORACIC ECHOCARDIOGRAM  12/2014   [facility]: Normal LV size & function. EF 55-60%,    vagina polyp     VIDEO ASSISTED THORACOSCOPY (VATS)/WEDGE RESECTION Left 03/02/2015   Procedure: VIDEO ASSISTED THORACOSCOPY (VATS), MINI THORACOTOMY, LEFT UPPER LOBE WEDGE, TAKE DOWN OF INTERNAL MAMMARY LESIONS, PLACEMENT OF ON-Q PUMP;  Surgeon: [name], MD;  Location: [facility] OR;  Service: Thoracic;  Laterality: Left;   VIDEO  BRONCHOSCOPY N/A 03/02/2015   Procedure: BRONCHOSCOPY;  Surgeon: [name], MD;  Location: [facility] OR;  Service: Thoracic;  Laterality: N/A;   VIDEO BRONCHOSCOPY WITH ENDOBRONCHIAL NAVIGATION N/A 10/08/2017   Procedure: VIDEO BRONCHOSCOPY WITH ENDOBRONCHIAL NAVIGATION WITH BIOPSIES OF LEFT UPPER LOBE AND LEFT LOWER LOBE;  Surgeon: [name], MD;  Location: [facility] OR;  Service: Thoracic;  Laterality: N/A;    Past Medical History:  Diagnosis Date   Abdominal aortic aneurysm (HCC)    REPAIRED IN 1996 BY [name]  AND HAS RECENTLY BEEN FOLLOWED BY [name]   Acquired asplenia     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 Most likely scar. Mild subendocardial ischemia. EF 71% LOW RISK.    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 Kellie Simmering   Pulmonary emboli [facility]) 02/06/2022   01/2022 PE/DVT post-op. Lower GI bleed     Treat edema  Start Eliquis at the reduced dose due to Creat>1.5 and age >35   Rheumatoid arthritis (HCC) 2011   Dr.Truslow; fracture knees, hands and wrists -    S/P CABG x 1  1996   CABG--LIMA-LAD for ostial LAD (not felt to be PCI amenable). EF NORMAL then; LIMA now atretic   Shortness of breath dyspnea    with exertion   Stroke (HCC) 11/2014   TIA    Urinary frequency     Medications:  I have reviewed the patient's current medications.  (Not in a hospital admission)   ALLERGIES:   Allergies  Allergen Reactions   Nsaids Other (See Comments)    Stomach upset Told to avoid due to Eliquis   Aspirin Other (See Comments)    Stomach upset Told to avoid due to Eliquis   Codeine Nausea And Vomiting   Eliquis [Apixaban] Other (See Comments)    Gi upset    Nitrostat [Nitroglycerin] Other (See Comments)    Bradycardia. Drop in heart rate    Tramadol Other (See Comments)    Confusion    FAM HX: Family History  Problem Relation Age of Onset   Hypertension Mother    Diabetes Mother    Heart disease Mother    Hyperlipidemia Mother    Stroke Father    Hyperlipidemia Sister    Hypertension Sister    Hypertension Daughter    Cancer Paternal Uncle        Deceased from cancer not sure of site   Hypertension Son    Hyperlipidemia Son    Hyperlipidemia Son    Hypertension Son    Hyperlipidemia Son    Hypertension Son    Hypertension Other    Coronary artery disease Other    Asthma Neg Hx    Colon cancer Neg Hx     Social History:   reports that she quit smoking about 64 years ago. Her smoking use included cigarettes. She has never been exposed to tobacco smoke. She has never used smokeless tobacco. She reports current alcohol use. She reports that she does not use drugs.  ROS: 12 system ROS neg except per HPI  Blood pressure (!) 118/55, pulse 82, temperature 98.9 F (37.2 C), resp. rate 15, height 5\' 3"  (1.6 m), weight 58.9 kg, SpO2 96%. PHYSICAL EXAM: Gen: elderly woman resting  comfortably on stretcher  Eyes: anicteric ENT: MMM  CV: RRR Abd:  soft Lungs: ant and laterally normal - no wheezes, normal WOB, on 2L [place] GU: no foley Extr:   1+ tibial edema Neuro: sleepy but oriented, arousable to shoulder shake    Results for orders placed or performed during the hospital encounter of 03/16/23 (from the past 48 hour(s))  CBC with Differential     Status: Abnormal   Collection Time: 03/16/23  2:20 PM  Result Value Ref Range   WBC 10.3 4.0 - 10.5 K/uL   RBC 2.46 (L) 3.87 - 5.11 MIL/uL   Hemoglobin 7.8 (L) 12.0 - 15.0 g/dL   HCT 16.1 (L) 09.6 - 04.5 %   MCV 95.9 80.0 - 100.0 fL   MCH 31.7 26.0 - 34.0 pg   MCHC 33.1 30.0 - 36.0 g/dL   RDW 40.9 (H) 81.1 - 91.4 %   Platelets 242 150 - 400 K/uL   nRBC 0.3 (H) 0.0 - 0.2 %   Neutrophils Relative % 73 %   Neutro Abs 7.7 1.7 - 7.7 K/uL   Lymphocytes Relative 8 %   Lymphs Abs 0.9 0.7 - 4.0 K/uL   Monocytes Relative 16 %   Monocytes Absolute 1.6 (H) 0.1 - 1.0 K/uL   Eosinophils Relative 1 %   Eosinophils Absolute 0.1 0.0 - 0.5 K/uL   Basophils Relative 1 %   Basophils Absolute 0.1 0.0 - 0.1 K/uL   Immature Granulocytes 1 %   Abs Immature Granulocytes 0.10 (H) 0.00 - 0.07 K/uL    Comment: Performed at [facility], [address] [place], [place] [address]  Comprehensive metabolic panel     Status: Abnormal   Collection Time: 03/16/23  2:20 PM  Result Value Ref Range   Sodium 132 (L) 135 - 145 mmol/L   Potassium 3.5 3.5 - 5.1 mmol/L   Chloride 102 98 - 111 mmol/L   CO2 12 (L) 22 - 32 mmol/L   Glucose, Bld 113 (H) 70 - 99 mg/dL    Comment: Glucose reference range applies only to samples taken after fasting for at least 8 hours.   BUN 136 (H) 8 - 23 mg/dL   Creatinine, Ser 6.21 (H) 0.44 - 1.00 mg/dL   Calcium 7.4 (L) 8.9 - 10.3 mg/dL   Total Protein 4.7 (L) 6.5 - 8.1 g/dL   Albumin 2.2 (L) 3.5 - 5.0 g/dL   AST 21 15 - 41 U/L   ALT 16 0 - 44 U/L   Alkaline Phosphatase 69 38 - 126 U/L   Total Bilirubin 0.9 <1.2 mg/dL   GFR, Estimated 5 (L) >60 mL/min    Comment: (NOTE) Calculated using the CKD-EPI Creatinine Equation (2021)    Anion gap 18 (H) 5 - 15     Comment: Performed at [facility], [address], [place], [place] [address]  Troponin I (High Sensitivity)     Status: Abnormal   Collection Time: 03/16/23  2:20 PM  Result Value Ref Range   Troponin I (High Sensitivity) 80 (H) <18 ng/L    Comment: (NOTE) Elevated high sensitivity troponin I (hsTnI) values and significant  changes across serial measurements may suggest ACS but many other  chronic and acute conditions are known to elevate hsTnI results.  Refer to the "Links" section for chest pain algorithms and additional  guidance. Performed at [facility], [address] [place], [place] [address]   Brain natriuretic peptide  Status: Abnormal   Collection Time: 03/16/23  2:20 PM  Result Value Ref Range   B Natriuretic Peptide 783.2 (H) 0.0 - 100.0 pg/mL    Comment: Performed at [facility], [address] [place], [place] [address]  SARS Coronavirus 2 by RT PCR (hospital order, performed in [facility] hospital lab) *cepheid single result test* Anterior Nasal Swab     Status: None   Collection Time: 03/16/23  3:25 PM   Specimen: Anterior Nasal Swab  Result Value Ref Range   SARS Coronavirus 2 by RT PCR NEGATIVE NEGATIVE    Comment: Performed at [facility], [address], [place], [place] [address]   *Note: Due to a large number of results and/or encounters for the requested time period, some results have not been displayed. A complete set of results can be found in Results Review.    DG Chest Portable 1 View  Result Date: 03/16/2023 CLINICAL DATA:  Short of breath EXAM: PORTABLE CHEST 1 VIEW COMPARISON:  02/15/2023 FINDINGS: Single frontal view of the chest demonstrates stable postsurgical changes from CABG. The cardiac silhouette is unremarkable. Interval development of trace bilateral pleural effusions. Streaky opacities at the left lung base could reflect atelectasis or scarring. No acute airspace disease or pneumothorax. No acute bony  abnormality. IMPRESSION: 1. Trace bilateral pleural effusions. 2. Streaky left basilar consolidation likely atelectasis or scarring. 3. No acute airspace disease. Electronically Signed   By: Sharlet Salina M.D.   On: 03/16/2023 17:46    Assessment/PlanEmily E [name] is an 82 y.o. 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   **Acute hypoxic respiratory failure: seems c/w COPD related issue given wheezing improved with albuterol.  CXR without edema.  Afebrile.  **ESRD:  pt with progressive CKD now presenting with uremic symptoms.  Has previously expressed desire for conservative care which she and family (husband by phone this PM) confirm.  Would involve hospice - anticipate she does not have much time left.    **Anemia: getting outpt ESA still, Hb down a bit, no overt bleeding.    Has a few other issue but in light of developing uremia would consult hospice and allow them to manage her symptomatically.  Nothing further to add but please don't hesitate to reach out with concerns.   Tyler Pita 03/16/2023, 7:10 PM

## 2023-03-16 NOTE — Assessment & Plan Note (Signed)
Chronic stable continue Lipitor 80 mg a day continue Coreg 6.25 mg twice daily

## 2023-03-16 NOTE — Assessment & Plan Note (Signed)
Continue Lipitor 80 mg a day.

## 2023-03-16 NOTE — Subjective & Objective (Signed)
Came in from [place] home complaining of shortness of breath since yesterday but has not been feeling well for few days now per staff patient was satting in the low 80s and started on 2 L with some improvement her right knee has been swollen Recently has been admitted for hypertensive crisis Has known history of CAD and DVT Endorsing cough and shortness of breath chest congestion

## 2023-03-16 NOTE — Assessment & Plan Note (Signed)
Order urine electrolytes, pt appears fluid overloaded

## 2023-03-16 NOTE — Assessment & Plan Note (Signed)
Chronic but maybe worse  Not hot  Pt have had fluid taken out in the past If persists to be an issue can do orthopedics consult Plain images ordered

## 2023-03-16 NOTE — Assessment & Plan Note (Signed)
-  -   Will initiate: Steroid taper  -  Antibiotics rocephin - Albuterol  PRN, - scheduled duoneb,  -  Breo or Dulera at discharge   -  Mucinex.  Titrate O2 to saturation >90%. Follow patients respiratory status.  influenza PCR VBG ordered  Currently mentating well no evidence of symptomatic hypercarbia

## 2023-03-16 NOTE — Assessment & Plan Note (Addendum)
Nephrology is aware appreciate the input Pt now uremic does not wish HD  Palliative care consulted Pt is DNR DNI

## 2023-03-16 NOTE — ED Provider Notes (Signed)
  Physical Exam  BP 128/72 (BP Location: Left Arm)   Pulse 73   Temp 97.6 F (36.4 C) (Oral)   Resp (!) 22   Ht 5\' 3"  (1.6 m)   Wt 58.9 kg   SpO2 98%   BMI 23.00 kg/m   Physical Exam  Procedures  Procedures  ED Course / MDM   Clinical Course as of 03/16/23 1517  Fri Mar 16, 2023  1510 Assumed care from Dr Doran Durand. 89 you F with COPD on 2L [place] was hypoxic at facility. NRB by ems and then weaned to 3L [place] here. Getting nebs. Knee pain is chronic and she has been evaluated for it. Has some BL lower extremity swelling. If able to be weaned to 2L can be dc'd.  [RP]    Clinical Course User Index [RP] Rondel Baton, MD   Medical Decision Making Amount and/or Complexity of Data Reviewed Labs: ordered. Radiology: ordered.  Risk Prescription drug management.   ***

## 2023-03-16 NOTE — ED Triage Notes (Signed)
Patient presents to ed via [facility] from [facility] c/o sob onset yest however patient states she hasn't been feeling well for several days. Yest per ems staff states patients sat were in the low 80's placed patient on 2 L/[place] and sat came up to the low 90's , states [facility] MD rounded on her today and she was c/o sob and right  knee being swollen. And wanted her sent out for further eval. Upon arrival patient is alert oriented, states she just doesn't feel good, and her right knee is hurting , states she had to have fluid drawn off knee when she was in the hospital last .

## 2023-03-16 NOTE — Progress Notes (Unsigned)
Provider:   Venita Sheffield MD Location:   [facility]    Place of Service:   Skilled care   PCP: Plotnikov, Georgina Quint, MD Patient Care Team: [name], MD as PCP - General (Internal Medicine) [name], MD as PCP - Cardiology (Cardiology) [name], MD as Consulting Physician (Cardiology) [name], MD as Consulting Physician (Rheumatology) Armbruster, [name], MD as Consulting Physician (Gastroenterology) [name], MD as Consulting Physician (Pulmonary Disease) [name], MD (Inactive) as Consulting Physician (Cardiothoracic Surgery) [name], MD as Consulting Physician (Obstetrics and Gynecology) Plotnikov, Georgina Quint, MD Colletta Maryland, RN as [facility] Care Management Roger Kill, NP as Nurse Practitioner (Nephrology)  Extended Emergency Contact Information Primary Emergency Contact: [name] Address: [address]           [place], [place] [address] [place] Home Phone: [phone] Mobile Phone: [phone] Relation: Spouse Secondary Emergency Contact: [facility] Address: [address]          [place], [place] [address] [place] Mobile Phone: [phone] Relation: Son  Code Status:  Goals of Care: Advanced Directive information    03/13/2023    3:38 PM  Advanced Directives  Does Patient Have a Medical Advance Directive? Yes  Type of Advance Directive Out of facility DNR (pink MOST or yellow form)  Does patient want to make changes to medical advance directive? No - Patient declined  Pre-existing out of facility DNR order (yellow form or pink MOST form) Pink MOST/Yellow Form most recent copy in chart - Physician notified to receive inpatient order      No chief complaint on file.   HPI: Patient is a 82 y.o. female seen today for acute visit for hypoxia and Rt leg swelling  Pt is lethargic, she is on supplemental o2  As per  staff pt was evaluated by palliative care yesterday and NP spoke with her husband and son but family did not decide on goal of care.  Pt is able to speak in short sentences Drowsy Did not eat her lunch  C/o pain in her Rt knee Denies chest pain, dizziness She knows her name, oriented to place.      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 Most likely scar. Mild subendocardial ischemia. EF 71% LOW RISK.    Cervical disc disease    fracture   Chronic kidney disease    CKD4   COPD (chronic obstructive pulmonary disease) (HCC)    Diverticulosis    DVT (deep venous thrombosis) (HCC)    RLE; Sept '23   History of DVT (deep vein thrombosis) 03/06/2022   History of pulmonary embolus (PE)  Oct '23   Hyperlipidemia    Hypertension    Hypothyroidism (acquired)    hypo   Myocardial infarction [facility]) 11/2014   TIA   Polymyalgia rheumatica (HCC)    2011 Dr. Kellie Simmering   Pulmonary emboli [facility]) 02/06/2022   01/2022 PE/DVT post-op. Lower GI bleed     Treat edema  Start Eliquis at the reduced dose due to Creat>1.5 and age >81   Rheumatoid arthritis (HCC) 2011   Dr.Truslow; fracture knees, hands and wrists -    S/P CABG x 1 1996   CABG--LIMA-LAD for ostial LAD (not felt to be PCI amenable). EF NORMAL then; LIMA now atretic   Shortness  of breath dyspnea    with exertion   Stroke (HCC) 11/2014   TIA    Urinary frequency    Past Surgical History:  Procedure Laterality Date   ABDOMINAL AORTIC ANEURYSM REPAIR  1996   Complicated by mesenteric artery stenosis and splenic artery infarction with acquired Asplenia   APPENDECTOMY     BIOPSY  07/08/2022   Procedure: BIOPSY;  Surgeon: Benancio Deeds, MD;  Location[facility] ENDOSCOPY;  Service: Gastroenterology;;   BIOPSY  11/28/2022   Procedure: BIOPSY;  Surgeon: [name], MD;  Location: [facility] ENDOSCOPY;  Service: Gastroenterology;;   [name]  07/2011   right foot   CARDIAC CATHETERIZATION  2005   (Most recent CATH) - ostial LAD lesion 20-30% (down from 90% initially). Atretic LIMA. Minimal disease the RCA and Circumflex system.   CARPAL TUNNEL RELEASE Left    CATARACT EXTRACTION Bilateral    CERVICAL SPINE SURGERY     plate 1610 Dr. Wynetta Emery   COLONOSCOPY     COLONOSCOPY WITH PROPOFOL N/A 11/28/2022   Procedure: COLONOSCOPY WITH PROPOFOL;  Surgeon: [name], MD;  Location: [facility] ENDOSCOPY;  Service: Gastroenterology;  Laterality: N/A;   CORONARY ARTERY BYPASS GRAFT  1996   INCLUDED AN INTERNAL MAMMARY ARTERY TO THE LAD. EF WAS NORMAL   ESOPHAGOGASTRODUODENOSCOPY (EGD) WITH PROPOFOL N/A 07/08/2022   Procedure: ESOPHAGOGASTRODUODENOSCOPY (EGD) WITH PROPOFOL;  Surgeon: Benancio Deeds, MD;  Location: [facility] ENDOSCOPY;  Service: Gastroenterology;  Laterality: N/A;   ESOPHAGOGASTRODUODENOSCOPY (EGD) WITH PROPOFOL N/A 11/28/2022   Procedure: ESOPHAGOGASTRODUODENOSCOPY (EGD) WITH PROPOFOL;  Surgeon: [name], MD;  Location: [facility] ENDOSCOPY;  Service: Gastroenterology;  Laterality: N/A;   INGUINAL HERNIA REPAIR Right    LAPAROSCOPIC APPENDECTOMY N/A 06/28/2016   Procedure: APPENDECTOMY LAPAROSCOPIC;  Surgeon: [name], MD;  Location: [facility] ORS;  Service: General;  Laterality: N/A;   NM MYOVIEW LTD  06/2010   Fixed anteroseptal, apical and inferoapical defect with moderate  size. Most likely scar. Mild subendocardial ischemia. EF 71% LOW RISK.    POLYPECTOMY  11/28/2022   Procedure: POLYPECTOMY;  Surgeon: [name], MD;  Location: [facility] ENDOSCOPY;  Service: Gastroenterology;;   SPLENECTOMY     TOTAL HIP ARTHROPLASTY Left 01/23/2022   Procedure: LEFT TOTAL HIP ARTHROPLASTY ANTERIOR APPROACH;  Surgeon: [name], MD;  Location: [facility] OR;  Service: Orthopedics;  Laterality: Left;  3-C   TRANSTHORACIC ECHOCARDIOGRAM  12/2014   [facility]: Normal LV size & function. EF 55-60%,    vagina polyp     VIDEO ASSISTED THORACOSCOPY (VATS)/WEDGE RESECTION Left 03/02/2015   Procedure: VIDEO ASSISTED THORACOSCOPY (VATS), MINI THORACOTOMY, LEFT UPPER LOBE WEDGE, TAKE DOWN OF INTERNAL MAMMARY LESIONS, PLACEMENT OF ON-Q PUMP;  Surgeon: [name], MD;  Location: [facility] OR;  Service: Thoracic;  Laterality: Left;   VIDEO BRONCHOSCOPY  N/A 03/02/2015   Procedure: BRONCHOSCOPY;  Surgeon: [name], MD;  Location: [facility] OR;  Service: Thoracic;  Laterality: N/A;   VIDEO BRONCHOSCOPY WITH ENDOBRONCHIAL NAVIGATION N/A 10/08/2017   Procedure: VIDEO BRONCHOSCOPY WITH ENDOBRONCHIAL NAVIGATION WITH BIOPSIES OF LEFT UPPER LOBE AND LEFT LOWER LOBE;  Surgeon: [name], MD;  Location: [facility] OR;  Service: Thoracic;  Laterality: N/A;    reports that she quit smoking about 64 years ago. Her smoking use included cigarettes. She has never been exposed to tobacco smoke. She has never used smokeless tobacco. She reports current alcohol use. She reports that she does not use drugs. Social History   Socioeconomic History   Marital status: Married    Spouse name: Not on file   Number of children: 4   Years of education: Not on file   Highest education level: Bachelor's degree (e.g., BA, AB, BS)  Occupational History   Occupation: retired    Associate Professor: RETIRED  Tobacco Use   Smoking status: Former    Current packs/day: 0.00    Types: Cigarettes    Quit date: 12/04/1958     Years since quitting: 64.3    Passive exposure: Never   Smokeless tobacco: Never  Vaping Use   Vaping status: Never Used  Substance and Sexual Activity   Alcohol use: Yes    Comment: hardly ever   Drug use: No   Sexual activity: Not Currently  Other Topics Concern   Not on file  Social History Narrative   [facility]  MARRIED -RETIRED ; 4 CHILDREN,2 CHILDREN.    [facility]   Social Determinants of Health   Financial Resource Strain: Low Risk  (09/05/2022)   Overall Financial Resource Strain (CARDIA)    Difficulty of Paying Living Expenses: Not hard at all  Food Insecurity: No Food Insecurity (02/17/2023)   Hunger Vital Sign    Worried About Running Out of Food in the Last Year: Never true    Ran Out of Food in the Last Year: Never true  Transportation Needs: No Transportation Needs (02/17/2023)   PRAPARE - Administrator, Civil Service (Medical): No    Lack of Transportation (Non-Medical): No  Physical Activity: Insufficiently Active (03/13/2023)   Exercise Vital Sign    Days of Exercise per Week: 1 day    Minutes of Exercise per Session: 10 min  Stress: Stress Concern Present (03/13/2023)   [employer] of Occupational Health - Occupational Stress Questionnaire    Feeling of Stress : To some extent  Social Connections: Moderately Integrated (09/05/2022)   Social Connection and Isolation Panel [NHANES]    Frequency of Communication with Friends and Family: More than three times a week    Frequency of Social Gatherings with Friends and Family: Patient declined    Attends Religious Services: Never    Database administrator or Organizations: Yes    Attends Banker Meetings: Patient declined    Marital Status: Married  Catering manager Violence: Not At Risk (02/17/2023)   Humiliation, Afraid, Rape, and Kick questionnaire    Fear of Current or Ex-Partner: No    Emotionally Abused: No    Physically  Abused: No    Sexually Abused: No    Functional Status Survey:    Family History  Problem Relation Age of Onset   Hypertension Mother    Diabetes Mother    Heart disease Mother    Hyperlipidemia  Mother    Stroke Father    Hyperlipidemia Sister    Hypertension Sister    Hypertension Daughter    Cancer Paternal Uncle        Deceased from cancer not sure of site   Hypertension Son    Hyperlipidemia Son    Hyperlipidemia Son    Hypertension Son    Hyperlipidemia Son    Hypertension Son    Hypertension Other    Coronary artery disease Other    Asthma Neg Hx    Colon cancer Neg Hx     Health Maintenance  Topic Date Due   Fecal DNA (Cologuard)  Never done   COVID-19 Vaccine (6 - 2023-24 season) 12/31/2022   Medicare Annual Wellness (AWV)  06/16/2023   DTaP/Tdap/Td (3 - Td or Tdap) 06/01/2026   Pneumonia Vaccine 59+ Years old  Completed   INFLUENZA VACCINE  Completed   DEXA SCAN  Completed   Zoster Vaccines- Shingrix  Completed   HPV VACCINES  Aged Out   Colonoscopy  Discontinued    Allergies  Allergen Reactions   Nsaids Other (See Comments)    Stomach upset Told to avoid due to Eliquis   Aspirin Other (See Comments)    Stomach upset Told to avoid due to Eliquis   Codeine Nausea And Vomiting   Eliquis [Apixaban] Other (See Comments)    Gi upset    Nitrostat [Nitroglycerin] Other (See Comments)    Bradycardia. Drop in heart rate    Tramadol Other (See Comments)    Confusion    Outpatient Encounter Medications as of 03/16/2023  Medication Sig   acetaminophen (TYLENOL) 325 MG tablet Take 2 tablets (650 mg total) by mouth 2 (two) times daily as needed (generalized pain).   albuterol (VENTOLIN HFA) 108 (90 Base) MCG/ACT inhaler Inhale 1-2 puffs into the lungs every 4 (four) hours as needed for wheezing or shortness of breath.   allopurinol (ZYLOPRIM) 100 MG tablet Take 1 tablet (100 mg total) by mouth daily.   amLODipine (NORVASC) 10 MG tablet Take 1 tablet (10  mg total) by mouth daily.   ascorbic acid (VITAMIN C) 500 MG tablet Take 1 tablet (500 mg total) by mouth daily.   atorvastatin (LIPITOR) 80 MG tablet Take 1 tablet (80 mg total) by mouth daily.   B Complex-C (B-COMPLEX WITH VITAMIN C) tablet Take 1 tablet by mouth daily.   bisacodyl (DULCOLAX) 5 MG EC tablet Take 2 tablets (10 mg total) by mouth daily as needed for moderate constipation or severe constipation.   carvedilol (COREG) 6.25 MG tablet Take 1 tablet (6.25 mg total) by mouth 2 (two) times daily with a meal.   Cholecalciferol (VITAMIN D3) 50 MCG (2000 UT) capsule Take 1 capsule (2,000 Units total) by mouth daily.   cloNIDine (CATAPRES - DOSED IN MG/24 HR) 0.2 mg/24hr patch Place 1 patch (0.2 mg total) onto the skin once a week.   clopidogrel (PLAVIX) 75 MG tablet Take 1 tablet (75 mg total) by mouth every other day.   Cyanocobalamin 2500 MCG TABS Take 2,500 mcg by mouth daily.   docusate sodium (COLACE) 100 MG capsule Take 1 capsule (100 mg total) by mouth 2 (two) times daily as needed for mild constipation.   hydrOXYzine (ATARAX) 25 MG tablet Take 1 tablet (25 mg total) by mouth 3 (three) times daily as needed for itching.   leflunomide (ARAVA) 20 MG tablet Take 1 tablet (20 mg total) by mouth daily.   levothyroxine (SYNTHROID) 88 MCG  tablet Take 1 tablet (88 mcg total) by mouth daily before breakfast.   melatonin 3 MG TABS tablet Take 1 tablet (3 mg total) by mouth at bedtime as needed.   Multiple Vitamins-Minerals (CENTRUM ADULT PO) Take 1 tablet by mouth daily.   pantoprazole (PROTONIX) 40 MG tablet Take 1 tablet (40 mg total) by mouth daily.   sertraline (ZOLOFT) 25 MG tablet Take 25 mg by mouth daily.   triamcinolone (NASACORT) 55 MCG/ACT AERO nasal inhaler Place 1 spray into the nose daily.   No facility-administered encounter medications on file as of 03/16/2023.    Review of Systems  Constitutional:  Positive for activity change and appetite change.  Respiratory:  Positive  for shortness of breath. Negative for cough.   Cardiovascular:  Positive for leg swelling. Negative for chest pain.  Gastrointestinal:  Positive for abdominal pain.  Genitourinary:  Negative for dysuria.    There were no vitals filed for this visit. There is no height or weight on file to calculate BMI. Physical Exam Constitutional:      Appearance: She is ill-appearing.  Cardiovascular:     Rate and Rhythm: Normal rate and regular rhythm.     Pulses: Normal pulses.     Heart sounds: Normal heart sounds.  Pulmonary:     Effort: Pulmonary effort is normal. No respiratory distress.     Breath sounds: Normal breath sounds. No wheezing or rales.  Abdominal:     General: Bowel sounds are normal. There is no distension.     Palpations: Abdomen is soft.     Tenderness: There is no abdominal tenderness. There is no guarding.  Musculoskeletal:        General: Swelling present.     Comments: Bilateral lower extremity swelling Rt knee - tender to palpation  Decreased strength Rt lower extremity    Neurological:     Mental Status: She is alert.     Labs reviewed: Basic Metabolic Panel: Recent Labs    07/24/22 1009 07/25/22 0533 08/01/22 0800 02/16/23 0356 02/18/23 0413 02/24/23 0435 02/25/23 0448 02/26/23 0416  NA 138 139   < > 136   < > 128* 127* 129*  K 4.3 4.0   < > 3.8   < > 4.9 4.6 4.3  CL 103 104   < > 98   < > 95* 97* 96*  CO2 21* 21*   < > 19*   < > 19* 18* 19*  GLUCOSE 132* 91   < > 110*   < > 103* 94 81  BUN 65* 62*   < > 77*   < > 106* 99* 100*  CREATININE 3.84* 3.71*   < > 4.28*   < > 4.99* 4.57* 4.66*  CALCIUM 9.3 9.6   < > 9.7   < > 9.9 9.7 10.2  MG  --  1.9  --  2.2   < > 2.2 2.2 2.2  PHOS 4.9* 5.4*  --  5.7*  --   --   --   --    < > = values in this interval not displayed.   Liver Function Tests: Recent Labs    10/04/22 1300 12/07/22 2140 02/15/23 0645  AST 25 29 24   ALT 18 17 17   ALKPHOS 73 85 81  BILITOT 0.5 1.2 0.9  PROT 5.8* 6.1* 7.0   ALBUMIN 3.0* 3.2* 3.6   No results for input(s): "LIPASE", "AMYLASE" in the last 8760 hours. No results for input(s): "AMMONIA" in the  last 8760 hours. CBC: Recent Labs    09/30/22 1849 10/11/22 1325 11/27/22 0105 11/27/22 0506 02/15/23 0551 02/15/23 0635 02/24/23 0435 02/25/23 0448 02/26/23 0416 03/12/23 1118  WBC 9.8  --  8.5   < > 9.4   < > 9.2 10.9* 10.7*  --   NEUTROABS 6.3  --  4.4  --  7.9*  --   --   --   --   --   HGB 10.4*   < > 10.0*   < > 12.8   < > 11.3* 10.6* 10.3* 8.6*  HCT 32.9*  --  32.0*   < > 39.6   < > 33.5* 31.5* 31.5*  --   MCV 97.9  --  101.3*   < > 100.8*   < > 97.4 95.7 96.6  --   PLT 343  --  227   < > 329   < > 363 361 366  --    < > = values in this interval not displayed.   Cardiac Enzymes: No results for input(s): "CKTOTAL", "CKMB", "CKMBINDEX", "TROPONINI" in the last 8760 hours. BNP: Invalid input(s): "POCBNP" No results found for: "HGBA1C" Lab Results  Component Value Date   TSH 2.829 02/15/2023   Lab Results  Component Value Date   VITAMINB12 >1500 (H) 09/05/2022   Lab Results  Component Value Date   FOLATE 11.5 09/04/2012   Lab Results  Component Value Date   IRON 106 03/12/2023   TIBC 199 (L) 03/12/2023   FERRITIN 474 (H) 03/12/2023    Imaging and Procedures obtained prior to SNF admission: No results found.  Assessment/Plan  1. Hypoxia Pt with supplemental O2  since yesterday  Pt did not decide whether they want to go with palliative / hospice  noted with lower extremity swelling  Rt knee tender to touch  Spoke with patient , she understands the risks and agreed to go to hospital   2. Generalized abdominal pain Tender to palpation  Will send her to ED for further eval  CKD   Cr trending 6.8 Hb 8.4   Family/ staff Communication:  discussed with SW, Nurse mentor

## 2023-03-16 NOTE — H&P (Signed)
[name] WRU:[id number] DOB: 05-28-1940 DOA: 03/16/2023     PCP: [name], MD   Outpatient Specialists:   CARDS: Dr. Bryan Lemma, MD  NEphrology:    Dr. Malen Gauze, Ivory Broad, NP   Pulmonology Dr. Sherene Sires GI  Armbruster, [name], MD    Patient arrived to ER on 03/16/23 at 1402 Referred by Attending Rondel Baton, MD   Patient coming from:    From facility friend's home    Chief Complaint:   Chief Complaint  Patient presents with   Shortness of Breath    HPI: [name] is a 82 y.o. female with medical history significant of CAD status post CABG, DVT/PE, COPD, CKD stage IV, arthritis, hypertension, hyperlipidemia.  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, asplenia hypothyroidism, polymyalgia rheumatica, CVA, gout    Presented with   worsening shortness of breath and right knee pain Came in from [place] home complaining of shortness of breath since yesterday but has not been feeling well for few days now per staff patient was satting in the low 80s and started on 2 L with some improvement her right knee has been swollen Recently has been admitted for hypertensive crisis Has known history of CAD and DVT Endorsing cough and shortness of breath chest congestion Patient noted to be lethargic at the SNF only able to speak in short sentences has not been eating well did not endorse any chest pain Denies any fever or chills  Pt does not want to be on HD  Denies significant ETOH intake   Does not smoke   Lab Results  Component Value Date   SARSCOV2NAA NEGATIVE 03/16/2023   SARSCOV2NAA NEGATIVE 07/02/2022   SARSCOV2NAA NEGATIVE 02/06/2022   SARSCOV2NAA NEGATIVE 09/15/2019        Regarding pertinent Chronic problems:    Hyperlipidemia -  on statins Lipitor (atorvastatin)  Lipid Panel     Component Value Date/Time   CHOL 157 09/07/2020 1126   CHOL 168 06/15/2020 1059   CHOL 146 04/25/2016 0852   TRIG 163 (H) 02/16/2023 0356   TRIG 93 04/25/2016  0852   HDL 59.30 09/07/2020 1126   HDL 67 06/15/2020 1059   HDL 60 04/25/2016 0852   CHOLHDL 3 09/07/2020 1126   VLDL 24.2 09/07/2020 1126   LDLCALC 74 09/07/2020 1126   LDLCALC 78 06/15/2020 1059   LDLCALC 68 04/25/2016 0852   LDLDIRECT 162.1 10/28/2009 0857   LABVLDL 23 06/15/2020 1059     HTN on Norvasc   chronic CHF diastolic/systolic/ combined - last echo  Recent Results (from the past 91478 hour(s))  ECHOCARDIOGRAM COMPLETE   Collection Time: 02/07/22 12:10 PM  Result Value   Weight 2,539.7   Height 63   BP 120/41   S' Lateral 2.30   Area-P 1/2 3.43   Narrative      ECHOCARDIOGRAM REPORT       Patient Name:   [name] Date of Exam: 02/07/2022 Medical Rec #:  [id number]      Height:       63.0 in Accession #:    [id number]     Weight:       158.7 lb Date of Birth:  [birth year]       BSA:          1.753 m Patient Age:    81 years       BP:           120/41 mmHg Patient Gender: F  HR:           79 bpm. Exam Location:  Inpatient  Procedure: 2D Echo, 3D Echo, Cardiac Doppler and Color Doppler  Indications:    Pulmonary Embolus I26.09   History:        Patient has prior history of Echocardiogram examinations, most                 recent 03/10/2019. CAD, Prior CABG, COPD and TIA; Risk                 Factors:Hypertension. History of AAA repair, GERD, GERD, Chronic                 kidney disease. Mesenteric artery stenosis.   Sonographer:    Leta Jungling RDCS Referring Phys: [id number] Cecille Po MELVIN  IMPRESSIONS    1. Left ventricular ejection fraction, by estimation, is 65 to 70%. Left ventricular ejection fraction by 3D volume is 68 %. The left ventricle has normal function. The left ventricle has no regional wall motion abnormalities. There is severe asymmetric  left ventricular hypertrophy. Left ventricular diastolic parameters are consistent with Grade II diastolic dysfunction (pseudonormalization).  2. Right ventricular systolic function is  normal. The right ventricular size is normal. There is normal pulmonary artery systolic pressure.  3. The mitral valve is normal in structure. Trivial mitral valve regurgitation. No evidence of mitral stenosis.  4. The aortic valve is tricuspid. There is mild thickening of the aortic valve. Aortic valve regurgitation is not visualized.  Comparison(s): Prior images reviewed side by side. MR has improved.            CAD  - On   statin,  Plavix                 -  followed by cardiology            Hypothyroidism:   Lab Results  Component Value Date   TSH 2.829 02/15/2023   on synthroid      COPD -  followed by pulmonology   not  on baseline oxygen  until 2 day ago       Hx of CVA - *with/out residual deficits on  Plavix    Hx of DVT/PE currently not on anticoagulation reportedly failed Eliquis and now on Plavix  ???   CKD stage IV baseline Cr 5 Estimated Creatinine Clearance: 4.4 mL/min (A) (by C-G formula based on SCr of 7.82 mg/dL (H)).  Lab Results  Component Value Date   CREATININE 7.82 (H) 03/16/2023   CREATININE 4.66 (H) 02/26/2023   CREATININE 4.57 (H) 02/25/2023   Lab Results  Component Value Date   NA 132 (L) 03/16/2023   CL 102 03/16/2023   K 3.5 03/16/2023   CO2 12 (L) 03/16/2023   BUN 136 (H) 03/16/2023   CREATININE 7.82 (H) 03/16/2023   GFRNONAA 5 (L) 03/16/2023   CALCIUM 7.4 (L) 03/16/2023   PHOS 5.7 (H) 02/16/2023   ALBUMIN 2.2 (L) 03/16/2023   GLUCOSE 113 (H) 03/16/2023     Chronic anemia - baseline hg Hemoglobin & Hematocrit  Recent Labs    02/26/23 0416 03/12/23 1118 03/16/23 1420  HGB 10.3* 8.6* 7.8*   Iron/TIBC/Ferritin/ %Sat    Component Value Date/Time   IRON 106 03/12/2023 1109   IRON 38 03/17/2022 0000   TIBC 199 (L) 03/12/2023 1109   FERRITIN 474 (H) 03/12/2023 1109   FERRITIN 59 03/17/2022 0000   IRONPCTSAT 53 (H) 03/12/2023 1109  IRONPCTSAT 53 (H) 05/23/2022 1127     While in ER: Clinical Course as of 03/16/23 1936  Fri  Mar 16, 2023  1510 Assumed care from Dr Doran Durand. 21 you F with COPD on 2L [place] was hypoxic at facility. NRB by ems and then weaned to 3L [place] here. Getting nebs. Knee pain is chronic and she has been evaluated for it. Has some BL lower extremity swelling. If able to be weaned to 2L can be dc'd.  [RP]  1559 Hemoglobin(!): 7.8 Baseline of 10 [RP]  1714 Bladder scan 71 ml  [RP]  1934 Dr Adela Glimpse [RP]    Clinical Course User Index [RP] Rondel Baton, MD       Lab Orders         SARS Coronavirus 2 by RT PCR (hospital order, performed in [facility] hospital lab) *cepheid single result test* Anterior Nasal Swab         CBC with Differential         Comprehensive metabolic panel         Blood gas, venous         Brain natriuretic peptide         D-dimer, quantitative       CXR - 1. Trace bilateral pleural effusions. 2. Streaky left basilar consolidation likely atelectasis or scarring.   Following Medications were ordered in ER: Medications  ipratropium-albuterol (DUONEB) 0.5-2.5 (3) MG/3ML nebulizer solution 9 mL (9 mLs Nebulization Given 03/16/23 1453)  methylPREDNISolone sodium succinate (SOLU-MEDROL) 125 mg/2 mL injection 125 mg (125 mg Intravenous Given 03/16/23 1448)    _______________________________________________________ ER Provider Called:     NEphrology   Dr. Glenna Fellows They Recommend admit to medicine    SEEN in ER     ED Triage Vitals  Encounter Vitals Group     BP 03/16/23 1438 128/72     Systolic BP Percentile --      Diastolic BP Percentile --      Pulse Rate 03/16/23 1438 73     Resp 03/16/23 1438 (!) 22     Temp 03/16/23 1438 97.6 F (36.4 C)     Temp Source 03/16/23 1438 Oral     SpO2 03/16/23 1437 98 %     Weight 03/16/23 1439 129 lb 13.6 oz (58.9 kg)     Height 03/16/23 1439 5\' 3"  (1.6 m)     Head Circumference --      Peak Flow --      Pain Score 03/16/23 1439 7     Pain Loc --      Pain Education --      Exclude from Growth Chart --   ZHYQ(65)@      _________________________________________ Significant initial  Findings: Abnormal Labs Reviewed  CBC WITH DIFFERENTIAL/PLATELET - Abnormal; Notable for the following components:      Result Value   RBC 2.46 (*)    Hemoglobin 7.8 (*)    HCT 23.6 (*)    RDW 17.7 (*)    nRBC 0.3 (*)    Monocytes Absolute 1.6 (*)    Abs Immature Granulocytes 0.10 (*)    All other components within normal limits  COMPREHENSIVE METABOLIC PANEL - Abnormal; Notable for the following components:   Sodium 132 (*)    CO2 12 (*)    Glucose, Bld 113 (*)    BUN 136 (*)    Creatinine, Ser 7.82 (*)    Calcium 7.4 (*)    Total Protein 4.7 (*)  Albumin 2.2 (*)    GFR, Estimated 5 (*)    Anion gap 18 (*)    All other components within normal limits  BRAIN NATRIURETIC PEPTIDE - Abnormal; Notable for the following components:   B Natriuretic Peptide 783.2 (*)    All other components within normal limits  TROPONIN I (HIGH SENSITIVITY) - Abnormal; Notable for the following components:   Troponin I (High Sensitivity) 80 (*)    All other components within normal limits      _________________________ Troponin   Cardiac Panel (last 3 results) Recent Labs    03/16/23 1420  TROPONINIHS 80*    ECG: Ordered Personally reviewed and interpreted by me showing: HR : 74 Rhythm: unable to intepret rhythm Short PR interval Anteroseptal infarct, age indeterminate Interpretation limited secondary to artifact QTC 432  BNP (last 3 results) Recent Labs    07/12/22 0826 02/15/23 0550 03/16/23 1420  BNP 865.3* 211.0* 783.2*     COVID-19 Labs  No results for input(s): "DDIMER", "FERRITIN", "LDH", "CRP" in the last 72 hours.  Lab Results  Component Value Date   SARSCOV2NAA NEGATIVE 03/16/2023   SARSCOV2NAA NEGATIVE 07/02/2022   SARSCOV2NAA NEGATIVE 02/06/2022   SARSCOV2NAA NEGATIVE 09/15/2019     The recent clinical data is shown below. Vitals:   03/16/23 1745 03/16/23 1800 03/16/23 1815 03/16/23 1829   BP: (!) 124/49 (!) 125/56 (!) 118/55   Pulse: 81 80 82   Resp: 17 15 15    Temp:    98.9 F (37.2 C)  TempSrc:      SpO2: 96% 96% 96%   Weight:      Height:       WBC     Component Value Date/Time   WBC 10.3 03/16/2023 1420   LYMPHSABS 0.9 03/16/2023 1420   MONOABS 1.6 (H) 03/16/2023 1420   EOSABS 0.1 03/16/2023 1420   BASOSABS 0.1 03/16/2023 1420    Lactic Acid, Venous    Component Value Date/Time   LATICACIDVEN 0.8 08/02/2022 0907    Procalcitonin  Ordered      UA ordered   Results for orders placed or performed during the hospital encounter of 03/16/23  SARS Coronavirus 2 by RT PCR (hospital order, performed in [facility] hospital lab) *cepheid single result test* Anterior Nasal Swab     Status: None   Collection Time: 03/16/23  3:25 PM   Specimen: Anterior Nasal Swab  Result Value Ref Range Status   SARS Coronavirus 2 by RT PCR NEGATIVE NEGATIVE Final    Comment: Performed at [facility], [address], [place], [place] [address]   *Note: Due to a large number of results and/or encounters for the requested time period, some results have not been displayed. A complete set of results can be found in Results Review.    ABX started rocephin   Susceptibility data from last 90 days. Collected Specimen Info Organism AMPICILLIN AMPICILLIN/SULBACTAM CEFEPIME CEFTRIAXONE Ciprofloxacin Gentamicin Susc lslt Imipenem Nitrofurantoin Susc lslt Piperacillin + Tazobactam Trimethoprim/Sulfa  02/20/23 Urine, Random Klebsiella pneumoniae  R  S  S  S  S  S  S  I  S  S   VBG ordered    __________________________________________________________ Recent Labs  Lab 03/16/23 1420  NA 132*  K 3.5  CO2 12*  GLUCOSE 113*  BUN 136*  CREATININE 7.82*  CALCIUM 7.4*    Cr   Up from baseline see below Lab Results  Component Value Date   CREATININE 7.82 (H) 03/16/2023  CREATININE 4.66 (H) 02/26/2023   CREATININE 4.57 (H) 02/25/2023    Recent Labs  Lab  03/16/23 1420  AST 21  ALT 16  ALKPHOS 69  BILITOT 0.9  PROT 4.7*  ALBUMIN 2.2*   Lab Results  Component Value Date   CALCIUM 7.4 (L) 03/16/2023   PHOS 5.7 (H) 02/16/2023    Plt: Lab Results  Component Value Date   PLT 242 03/16/2023         Recent Labs  Lab 03/12/23 1118 03/16/23 1420  WBC  --  10.3  NEUTROABS  --  7.7  HGB 8.6* 7.8*  HCT  --  23.6*  MCV  --  95.9  PLT  --  242    HG/HCT  Down   from baseline see below    Component Value Date/Time   HGB 7.8 (L) 03/16/2023 1420   HCT 23.6 (L) 03/16/2023 1420   MCV 95.9 03/16/2023 1420   MCV 90.9 01/29/2015 1524     _______________________________________________ Hospitalist was called for admission for copd uremia    The following Work up has been ordered so far:  Orders Placed This Encounter  Procedures   SARS Coronavirus 2 by RT PCR (hospital order, performed in [facility] hospital lab) *cepheid single result test* Anterior Nasal Swab   DG Chest Portable 1 View   CBC with Differential   Comprehensive metabolic panel   Blood gas, venous   Brain natriuretic peptide   D-dimer, quantitative   Bladder scan   Do not attempt resuscitation (DNR)- Limited -Do Not Intubate (DNI)   Consult to nephrology   Consult to hospitalist   Consult to palliative care   Airborne and Contact precautions   EKG 12-Lead     OTHER Significant initial  Findings:  labs showing:     DM  labs:  HbA1C: No results for input(s): "HGBA1C" in the last 8760 hours.     CBG (last 3)  No results for input(s): "GLUCAP" in the last 72 hours.        Cultures:    Component Value Date/Time   SDES URINE, RANDOM 02/20/2023 1110   SPECREQUEST NONE 02/20/2023 1110   CULT (A) 02/20/2023 1110    >=100,000 COLONIES/mL KLEBSIELLA PNEUMONIAE NO GROUP B STREP (S.AGALACTIAE) ISOLATED Performed at [facility], [address], [place], [place] [address]    REPTSTATUS 02/23/2023 FINAL 02/20/2023 1110     Radiological Exams  on Admission: DG Chest Portable 1 View  Result Date: 03/16/2023 CLINICAL DATA:  Short of breath EXAM: PORTABLE CHEST 1 VIEW COMPARISON:  02/15/2023 FINDINGS: Single frontal view of the chest demonstrates stable postsurgical changes from CABG. The cardiac silhouette is unremarkable. Interval development of trace bilateral pleural effusions. Streaky opacities at the left lung base could reflect atelectasis or scarring. No acute airspace disease or pneumothorax. No acute bony abnormality. IMPRESSION: 1. Trace bilateral pleural effusions. 2. Streaky left basilar consolidation likely atelectasis or scarring. 3. No acute airspace disease. Electronically Signed   By: Sharlet Salina M.D.   On: 03/16/2023 17:46   _______________________________________________________________________________________________________ Latest  Blood pressure (!) 118/55, pulse 82, temperature 98.9 F (37.2 C), resp. rate 15, height 5\' 3"  (1.6 m), weight 58.9 kg, SpO2 96%.   Vitals  labs and radiology finding personally reviewed  Review of Systems:    Pertinent positives include:   fatigue,confusion  Constitutional:  No weight loss, night sweats, Fevers, chills, weight loss  HEENT:  No headaches, Difficulty swallowing,Tooth/dental problems,Sore throat,  No sneezing,  itching, ear ache, nasal congestion, post nasal drip,  Cardio-vascular:  No chest pain, Orthopnea, PND, anasarca, dizziness, palpitations.no Bilateral lower extremity swelling  GI:  No heartburn, indigestion, abdominal pain, nausea, vomiting, diarrhea, change in bowel habits, loss of appetite, melena, blood in stool, hematemesis Resp:  no shortness of breath at rest. No dyspnea on exertion, No excess mucus, no productive cough, No non-productive cough, No coughing up of blood.No change in color of mucus.No wheezing. Skin:  no rash or lesions. No jaundice GU:  no dysuria, change in color of urine, no urgency or frequency. No straining to urinate.  No flank  pain.  Musculoskeletal:  No joint pain or no joint swelling. No decreased range of motion. No back pain.  Psych:  No change in mood or affect. No depression or anxiety. No memory loss.  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 Most likely scar. Mild subendocardial ischemia. EF 71% LOW RISK.    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 Kellie Simmering   Pulmonary emboli [facility]) 02/06/2022   01/2022 PE/DVT post-op. Lower GI bleed     Treat edema  Start Eliquis at the reduced dose due to Creat>1.5 and age >54   Rheumatoid  arthritis (HCC) 2011   Dr.Truslow; fracture knees, hands and wrists -    S/P CABG x 1 1996   CABG--LIMA-LAD for ostial LAD (not felt to be PCI amenable). EF NORMAL then; LIMA now atretic   Shortness of breath dyspnea    with exertion   Stroke (HCC) 11/2014   TIA    Urinary frequency       Past Surgical History:  Procedure Laterality Date   ABDOMINAL AORTIC ANEURYSM REPAIR  1996   Complicated by mesenteric artery stenosis and splenic artery infarction with acquired Asplenia   APPENDECTOMY     BIOPSY  07/08/2022   Procedure: BIOPSY;  Surgeon: Benancio Deeds, MD;  Location: [facility] ENDOSCOPY;  Service: Gastroenterology;;   BIOPSY  11/28/2022   Procedure: BIOPSY;  Surgeon: [name], MD;  Location: [facility] ENDOSCOPY;  Service: Gastroenterology;;   [name]  07/2011   right foot   CARDIAC CATHETERIZATION  2005   (Most recent CATH) - ostial LAD lesion 20-30% (down from 90% initially). Atretic LIMA. Minimal disease the RCA and Circumflex system.   CARPAL TUNNEL RELEASE Left    CATARACT EXTRACTION Bilateral    CERVICAL SPINE SURGERY     plate 9147 Dr. Wynetta Emery   COLONOSCOPY     COLONOSCOPY WITH PROPOFOL N/A 11/28/2022   Procedure: COLONOSCOPY WITH PROPOFOL;  Surgeon: [name], MD;  Location: [facility] ENDOSCOPY;  Service: Gastroenterology;  Laterality: N/A;   CORONARY ARTERY BYPASS GRAFT  1996   INCLUDED AN INTERNAL MAMMARY ARTERY TO THE LAD. EF WAS NORMAL   ESOPHAGOGASTRODUODENOSCOPY (EGD) WITH PROPOFOL N/A 07/08/2022   Procedure: ESOPHAGOGASTRODUODENOSCOPY (EGD) WITH PROPOFOL;  Surgeon: Benancio Deeds, MD;  Location[facility] ENDOSCOPY;  Service: Gastroenterology;  Laterality: N/A;   ESOPHAGOGASTRODUODENOSCOPY (EGD) WITH PROPOFOL N/A 11/28/2022   Procedure: ESOPHAGOGASTRODUODENOSCOPY (EGD) WITH PROPOFOL;  Surgeon: [name], MD;  Location: [facility] ENDOSCOPY;  Service: Gastroenterology;  Laterality: N/A;   INGUINAL HERNIA REPAIR Right    LAPAROSCOPIC APPENDECTOMY N/A 06/28/2016   Procedure:  APPENDECTOMY LAPAROSCOPIC;  Surgeon: [name], MD;  Location: [facility] ORS;  Service: General;  Laterality: N/A;   NM MYOVIEW LTD  06/2010   Fixed anteroseptal, apical and inferoapical defect with moderate size. Most likely scar. Mild subendocardial ischemia. EF 71% LOW RISK.    POLYPECTOMY  11/28/2022   Procedure: POLYPECTOMY;  Surgeon: [name], MD;  Location: [facility] ENDOSCOPY;  Service: Gastroenterology;;   SPLENECTOMY     TOTAL HIP ARTHROPLASTY Left 01/23/2022   Procedure: LEFT TOTAL HIP ARTHROPLASTY ANTERIOR APPROACH;  Surgeon: [name], MD;  Location: [facility] OR;  Service: Orthopedics;  Laterality: Left;  3-C   TRANSTHORACIC ECHOCARDIOGRAM  12/2014   [facility]: Normal LV size & function. EF 55-60%,    vagina polyp     VIDEO ASSISTED THORACOSCOPY (VATS)/WEDGE RESECTION Left 03/02/2015   Procedure: VIDEO ASSISTED THORACOSCOPY (VATS), MINI THORACOTOMY, LEFT UPPER LOBE WEDGE, TAKE DOWN OF INTERNAL MAMMARY LESIONS, PLACEMENT OF ON-Q PUMP;  Surgeon: [name], MD;  Location: [facility] OR;  Service: Thoracic;  Laterality: Left;   VIDEO BRONCHOSCOPY N/A 03/02/2015   Procedure: BRONCHOSCOPY;  Surgeon: [name], MD;  Location: [facility] OR;  Service: Thoracic;  Laterality: N/A;   VIDEO BRONCHOSCOPY WITH ENDOBRONCHIAL NAVIGATION N/A 10/08/2017   Procedure: VIDEO BRONCHOSCOPY WITH ENDOBRONCHIAL NAVIGATION WITH BIOPSIES OF LEFT UPPER LOBE AND LEFT LOWER LOBE;  Surgeon: [name], MD;  Location: [facility] OR;  Service: Thoracic;  Laterality: N/A;    Social History:  Ambulatory walker       reports that she quit smoking about 64 years ago. Her smoking use included cigarettes. She has never been exposed to tobacco smoke. She has never used smokeless tobacco. She reports current alcohol use. She reports that she does not use drugs.     Family History:   Family History  Problem Relation Age of Onset   Hypertension Mother    Diabetes Mother    Heart disease Mother     Hyperlipidemia Mother    Stroke Father    Hyperlipidemia Sister    Hypertension Sister    Hypertension Daughter    Cancer Paternal Uncle        Deceased from cancer not sure of site   Hypertension Son    Hyperlipidemia Son  Hyperlipidemia Son    Hypertension Son    Hyperlipidemia Son    Hypertension Son    Hypertension Other    Coronary artery disease Other    Asthma Neg Hx    Colon cancer Neg Hx    ______________________________________________________________________________________________ Allergies: Allergies  Allergen Reactions   Nsaids Other (See Comments)    Stomach upset Told to avoid due to Eliquis   Aspirin Other (See Comments)    Stomach upset Told to avoid due to Eliquis   Codeine Nausea And Vomiting   Eliquis [Apixaban] Other (See Comments)    Gi upset    Nitrostat [Nitroglycerin] Other (See Comments)    Bradycardia. Drop in heart rate    Tramadol Other (See Comments)    Confusion     Prior to Admission medications   Medication Sig Start Date End Date Taking? Authorizing Provider  acetaminophen (TYLENOL) 325 MG tablet Take 2 tablets (650 mg total) by mouth 2 (two) times daily as needed (generalized pain). 08/24/22   Mast, Man X, NP  albuterol (VENTOLIN HFA) 108 (90 Base) MCG/ACT inhaler Inhale 1-2 puffs into the lungs every 4 (four) hours as needed for wheezing or shortness of breath. 03/27/22   Mast, Man X, NP  allopurinol (ZYLOPRIM) 100 MG tablet Take 1 tablet (100 mg total) by mouth daily. 02/27/23   Amin, Ankit C, MD  amLODipine (NORVASC) 10 MG tablet Take 1 tablet (10 mg total) by mouth daily. 02/26/23   Amin, Ankit C, MD  ascorbic acid (VITAMIN C) 500 MG tablet Take 1 tablet (500 mg total) by mouth daily. 03/27/22   Mast, Man X, NP  atorvastatin (LIPITOR) 80 MG tablet Take 1 tablet (80 mg total) by mouth daily. 02/08/23   Plotnikov, Georgina Quint, MD  B Complex-C (B-COMPLEX WITH VITAMIN C) tablet Take 1 tablet by mouth daily. 02/26/23   Amin, Ankit C, MD   bisacodyl (DULCOLAX) 5 MG EC tablet Take 2 tablets (10 mg total) by mouth daily as needed for moderate constipation or severe constipation. 02/26/23   Amin, Ankit C, MD  carvedilol (COREG) 6.25 MG tablet Take 1 tablet (6.25 mg total) by mouth 2 (two) times daily with a meal. 01/11/23   Plotnikov, Georgina Quint, MD  Cholecalciferol (VITAMIN D3) 50 MCG (2000 UT) capsule Take 1 capsule (2,000 Units total) by mouth daily. 03/27/22   Mast, Man X, NP  cloNIDine (CATAPRES - DOSED IN MG/24 HR) 0.2 mg/24hr patch Place 1 patch (0.2 mg total) onto the skin once a week. 01/20/23   Plotnikov, Georgina Quint, MD  clopidogrel (PLAVIX) 75 MG tablet Take 1 tablet (75 mg total) by mouth every other day. 01/31/23   [name], MD  Cyanocobalamin 2500 MCG TABS Take 2,500 mcg by mouth daily. 08/24/22   Mast, Man X, NP  docusate sodium (COLACE) 100 MG capsule Take 1 capsule (100 mg total) by mouth 2 (two) times daily as needed for mild constipation. 02/26/23   Amin, Ankit C, MD  hydrOXYzine (ATARAX) 25 MG tablet Take 1 tablet (25 mg total) by mouth 3 (three) times daily as needed for itching. 02/26/23   Amin, Ankit C, MD  leflunomide (ARAVA) 20 MG tablet Take 1 tablet (20 mg total) by mouth daily. 08/24/22   Mast, Man X, NP  levothyroxine (SYNTHROID) 88 MCG tablet Take 1 tablet (88 mcg total) by mouth daily before breakfast. 08/24/22   Mast, Man X, NP  melatonin 3 MG TABS tablet Take 1 tablet (3 mg total) by mouth  at bedtime as needed. 02/26/23   Amin, Ankit C, MD  Multiple Vitamins-Minerals (CENTRUM ADULT PO) Take 1 tablet by mouth daily.    [provider]  pantoprazole (PROTONIX) 40 MG tablet Take 1 tablet (40 mg total) by mouth daily. 08/24/22   Mast, Man X, NP  sertraline (ZOLOFT) 25 MG tablet Take 25 mg by mouth daily.    [provider]  triamcinolone (NASACORT) 55 MCG/ACT AERO nasal inhaler Place 1 spray into the nose daily. 08/24/22   Mast, Man X, NP     ___________________________________________________________________________________________________ Physical Exam:    03/16/2023    6:15 PM 03/16/2023    6:00 PM 03/16/2023    5:45 PM  Vitals with BMI  Systolic 118 125 846  Diastolic 55 56 49  Pulse 82 80 81     1. General:  in No  Acute distress   Chronically ill   -appearing 2. Psychological:  somnolent but Oriented 3. Head/ENT:   Dry Mucous Membranes                          Head Non traumatic, neck supple                           Poor Dentition 4. SKIN:  decreased Skin turgor,  Skin clean Dry and intact no rash    5. Heart: Regular rate and rhythm no  Murmur, no Rub or gallop 6. Lungs:some  wheezes or crackles   7. Abdomen: Soft,  non-tender, Non distended bowel sounds present 8. Lower extremities: no clubbing, cyanosis,  edema Right > left Right knee edema no heat no redness 9. Neurologically Grossly intact, moving all 4 extremities equally  10. MSK: Normal range of motion    Chart has been reviewed  ______________________________________________________________________________________________  Assessment/Plan  82 y.o. female with medical history significant of CAD status post CABG, DVT/PE, COPD, CKD stage IV, arthritis, hypertension, hyperlipidemia.  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, asplenia hypothyroidism, polymyalgia rheumatica, CVA, gout   Admitted for COPD exacerbation and uremia    Present on Admission:  Hyponatremia  COPD exacerbation (HCC)  Coronary artery disease involving native coronary artery of native heart without angina pectoris  Acute renal failure superimposed on stage 4 chronic kidney disease (HCC)  Acute metabolic encephalopathy  Acquired hypothyroidism  Hyperlipidemia with target LDL less than 70  Acute respiratory failure with hypoxia (HCC)  Leg edema  Knee pain  Anemia due to chronic kidney disease     Hyponatremia Order urine electrolytes, pt appears fluid  overloaded  Coronary artery disease involving native coronary artery of native heart without angina pectoris Chronic stable continue Lipitor 80 mg a day continue Coreg 6.25 mg twice daily  Acute renal failure superimposed on stage 4 chronic kidney disease [facility] Nephrology is aware appreciate the input Pt now uremic does not wish HD  Palliative care consulted Pt is DNR DNI  Acute metabolic encephalopathy   - most likely multifactorial secondary to combination of AKI COPD exacerbation   And most likey Uremia End stage reanl Dz Pt does not wish for HD palliative consult   - Hold contributing medications    Will obtain CT head to rule out other etiologies pt does state she has fallen but unclear when, possibly a while back   - neurological exam appears to be nonfocal but patient unable to cooperate fully   - VBG ordered and ammonia ordered  Acquired hypothyroidism Check TSH continue Synthroid at 88 mcg a day  Hyperlipidemia with target LDL less than 70 Continue Lipitor 80 mg a day  COPD exacerbation (HCC)  -  - Will initiate: Steroid taper  -  Antibiotics rocephin - Albuterol  PRN, - scheduled duoneb,  -  Breo or Dulera at discharge   -  Mucinex.  Titrate O2 to saturation >90%. Follow patients respiratory status.  influenza PCR VBG ordered  Currently mentating well no evidence of symptomatic hypercarbia   Acute respiratory failure with hypoxia (HCC)  this patient has acute respiratory failure with Hypoxia  as documented by the presence of following: O2 saturatio< 90% on RA  Likely due to:   COPD exacerbation,   Provide O2 therapy and titrate as needed  Continuous pulse ox   check Pulse ox with ambulation prior to discharge    flutter valve ordered   Leg edema Obtain dopplers of Bilateral legs Right >LEft    Knee pain Chronic but maybe worse  Not hot  Pt have had fluid taken out in the past If persists to be an issue can do orthopedics consult Plain images  ordered  Anemia due to chronic kidney disease Continue to monitor if HG < 7 can discuss with family /pt if they want blood transfusion  Over all poor prognosis      Other plan as per orders.  DVT prophylaxis:  SCD   Code Status:    Code Status: Limited: Do not attempt resuscitation (DNR) -DNR-LIMITED -Do Not Intubate/DNI    DNR/DNI  e as per patient   I had personally discussed CODE STATUS with patient  ACP  has been reviewed     Family Communication:   Family not at  Bedside    Diet  npo while sedated if mental status improved can reasses   Disposition Plan:    Back to current facility when stable                                            Electrolytes corrected                               Anemia  stable                                                        Will need consultants to evaluate patient prior to discharge       Consult Orders  (From admission, onward)           Start     Ordered   03/16/23 1833  Consult to hospitalist  Paged by Annice Pih  Once       Provider:  (Not yet assigned)  Question Answer Comment  Place call to: [facility]   Reason for Consult Admit      03/16/23 1832   03/16/23 1833  Consult to palliative care  Once       Provider:  (Not yet assigned)  Question Answer Comment  Palliative Care Consult Services Palliative Medicine Consult   Reason for Consult? worsening resp function      03/16/23 1833  Palliative care    consulted                        Consults called:     Treatment Team:  Tyler Pita, MD  Admission status:  ED Disposition     ED Disposition  Admit   Condition  --   Comment  Hospital Area: [facility] [[id number]]  Level of Care: Progressive [102]  Admit to Progressive based on following criteria: RESPIRATORY PROBLEMS hypoxemic/hypercapnic respiratory failure that is responsive to NIPPV (BiPAP) or High Flow Nasal Cannula (6-80 lpm). Frequent  assessment/intervention, no > Q2 hrs < Q4 hrs, to maintain oxygenation and pulmonary hygiene.  May admit patient to [facility] or [facility] if equivalent level of care is available:: No  Covid Evaluation: Asymptomatic - no recent exposure (last 10 days) testing not required  Diagnosis: COPD exacerbation [facility]) [[id number]]  Admitting Physician: Therisa Doyne [[id number]]  Attending Physician: Therisa Doyne [[id number]]  Certification:: I certify this patient will need inpatient services for at least 2 midnights  Expected Medical Readiness: 03/19/2023            inpatient     I Expect 2 midnight stay secondary to severity of patient's current illness need for inpatient interventions justified by the following:  hemodynamic instability despite optimal treatment   Severe lab/radiological/exam abnormalities including:    AKI and extensive comorbidities including: CKD   That are currently affecting medical management.   I expect  patient to be hospitalized for 2 midnights requiring inpatient medical care.  Patient is at high risk for adverse outcome (such as loss of life or disability) if not treated.  Indication for inpatient stay as follows:  Severe change from baseline regarding mental status   New or worsening hypoxia   Need for IV antibiotics     Level of care    progressive      tele indefinitely please discontinue once patient no longer qualifies COVID-19 Labs    Lab Results  Component Value Date   SARSCOV2NAA NEGATIVE 03/16/2023     Precautions: admitted as   Covid Negative        Safir Michalec 03/16/2023, 9:23 PM    [facility]     after 2 AM please page floor coverage PA If 7AM-7PM, please contact the day team taking care of the patient using [email or web address]

## 2023-03-16 NOTE — Assessment & Plan Note (Addendum)
-   most likely multifactorial secondary to combination of AKI COPD exacerbation   And most likey Uremia End stage reanl Dz Pt does not wish for HD palliative consult   - Hold contributing medications    Will obtain CT head to rule out other etiologies pt does state she has fallen but unclear when, possibly a while back   - neurological exam appears to be nonfocal but patient unable to cooperate fully   - VBG ordered and ammonia ordered

## 2023-03-17 ENCOUNTER — Inpatient Hospital Stay (HOSPITAL_COMMUNITY): Payer: Medicare PPO

## 2023-03-17 ENCOUNTER — Encounter (HOSPITAL_COMMUNITY): Payer: Self-pay | Admitting: Internal Medicine

## 2023-03-17 DIAGNOSIS — R7989 Other specified abnormal findings of blood chemistry: Secondary | ICD-10-CM

## 2023-03-17 DIAGNOSIS — R609 Edema, unspecified: Secondary | ICD-10-CM | POA: Diagnosis not present

## 2023-03-17 DIAGNOSIS — Z515 Encounter for palliative care: Secondary | ICD-10-CM | POA: Diagnosis not present

## 2023-03-17 DIAGNOSIS — R0602 Shortness of breath: Secondary | ICD-10-CM | POA: Diagnosis not present

## 2023-03-17 DIAGNOSIS — N179 Acute kidney failure, unspecified: Secondary | ICD-10-CM | POA: Diagnosis not present

## 2023-03-17 DIAGNOSIS — Z7189 Other specified counseling: Secondary | ICD-10-CM

## 2023-03-17 DIAGNOSIS — J441 Chronic obstructive pulmonary disease with (acute) exacerbation: Secondary | ICD-10-CM | POA: Diagnosis not present

## 2023-03-17 LAB — ECHOCARDIOGRAM COMPLETE
AR max vel: 2.79 cm2
AV Peak grad: 10.4 mm[Hg]
Ao pk vel: 1.61 m/s
Area-P 1/2: 5.02 cm2
Height: 63 in
MV M vel: 4.95 m/s
MV Peak grad: 98 mm[Hg]
S' Lateral: 2.6 cm
Weight: 2077.62 [oz_av]

## 2023-03-17 LAB — CBC
HCT: 24.7 % — ABNORMAL LOW (ref 36.0–46.0)
Hemoglobin: 8.2 g/dL — ABNORMAL LOW (ref 12.0–15.0)
MCH: 32.3 pg (ref 26.0–34.0)
MCHC: 33.2 g/dL (ref 30.0–36.0)
MCV: 97.2 fL (ref 80.0–100.0)
Platelets: 281 K/uL (ref 150–400)
RBC: 2.54 MIL/uL — ABNORMAL LOW (ref 3.87–5.11)
RDW: 17.7 % — ABNORMAL HIGH (ref 11.5–15.5)
WBC: 9.3 K/uL (ref 4.0–10.5)
nRBC: 0.5 % — ABNORMAL HIGH (ref 0.0–0.2)

## 2023-03-17 LAB — URINALYSIS, COMPLETE (UACMP) WITH MICROSCOPIC
Bilirubin Urine: NEGATIVE
Glucose, UA: NEGATIVE mg/dL
Hgb urine dipstick: NEGATIVE
Ketones, ur: NEGATIVE mg/dL
Nitrite: NEGATIVE
Protein, ur: >=300 mg/dL — AB
Specific Gravity, Urine: 1.012 (ref 1.005–1.030)
WBC, UA: >50 WBC/hpf (ref 0–5)
pH: 5 (ref 5.0–8.0)

## 2023-03-17 LAB — MRSA NEXT GEN BY PCR, NASAL: MRSA by PCR Next Gen: NOT DETECTED

## 2023-03-17 LAB — COMPREHENSIVE METABOLIC PANEL WITH GFR
ALT: 19 U/L (ref 0–44)
AST: 21 U/L (ref 15–41)
Albumin: 2.3 g/dL — ABNORMAL LOW (ref 3.5–5.0)
Alkaline Phosphatase: 74 U/L (ref 38–126)
Anion gap: 24 — ABNORMAL HIGH (ref 5–15)
BUN: 144 mg/dL — ABNORMAL HIGH (ref 8–23)
CO2: 9 mmol/L — ABNORMAL LOW (ref 22–32)
Calcium: 7.4 mg/dL — ABNORMAL LOW (ref 8.9–10.3)
Chloride: 103 mmol/L (ref 98–111)
Creatinine, Ser: 8.29 mg/dL — ABNORMAL HIGH (ref 0.44–1.00)
GFR, Estimated: 4 mL/min — ABNORMAL LOW
Glucose, Bld: 147 mg/dL — ABNORMAL HIGH (ref 70–99)
Potassium: 3.9 mmol/L (ref 3.5–5.1)
Sodium: 136 mmol/L (ref 135–145)
Total Bilirubin: 0.7 mg/dL
Total Protein: 5.1 g/dL — ABNORMAL LOW (ref 6.5–8.1)

## 2023-03-17 LAB — PREALBUMIN: Prealbumin: 8 mg/dL — ABNORMAL LOW (ref 18–38)

## 2023-03-17 LAB — VITAMIN B12: Vitamin B-12: 6237 pg/mL — ABNORMAL HIGH (ref 180–914)

## 2023-03-17 LAB — MAGNESIUM: Magnesium: 1.6 mg/dL — ABNORMAL LOW (ref 1.7–2.4)

## 2023-03-17 LAB — CREATININE, URINE, RANDOM: Creatinine, Urine: 98 mg/dL

## 2023-03-17 LAB — OSMOLALITY, URINE: Osmolality, Ur: 314 mosm/kg (ref 300–900)

## 2023-03-17 LAB — SODIUM, URINE, RANDOM: Sodium, Ur: 20 mmol/L

## 2023-03-17 LAB — FOLATE: Folate: >40 ng/mL (ref >5.9)

## 2023-03-17 LAB — TROPONIN I (HIGH SENSITIVITY)
Troponin I (High Sensitivity): 76 ng/L — ABNORMAL HIGH (ref <18)
Troponin I (High Sensitivity): 69 ng/L — ABNORMAL HIGH

## 2023-03-17 LAB — PHOSPHORUS: Phosphorus: 10.6 mg/dL — ABNORMAL HIGH (ref 2.5–4.6)

## 2023-03-17 MED ORDER — MAGNESIUM SULFATE 2 GM/50ML IV SOLN
2.0000 g | Freq: Once | INTRAVENOUS | Status: AC
  Administered 2023-03-17: 2 g via INTRAVENOUS
  Filled 2023-03-17: qty 50

## 2023-03-17 NOTE — ED Notes (Signed)
Shift report received, assumed care of patient at this time 

## 2023-03-17 NOTE — Progress Notes (Signed)
Echocardiogram has been completed. 

## 2023-03-17 NOTE — Progress Notes (Signed)
VASCULAR LAB    Bilateral lower extremity venous duplex has been performed.  See CV proc for preliminary results.   Maryssa Giampietro, RVT 03/17/2023, 9:07 AM

## 2023-03-17 NOTE — Consult Note (Signed)
Consultation Note Date: 03/17/2023   Patient Name: [name]  DOB: 14-Oct-1940  MRN: [id number]  Age / Sex: 82 y.o., female  PCP: Plotnikov, Georgina Quint, MD Referring Physician: Glade Lloyd, MD  Reason for Consultation: Establishing goals of care  HPI/Patient Profile: 82 y.o. 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   Patient has had 3 admissions in the past 6 months, as well as 2 ED visits. She was noted to be more lethargic at SNF. Currently admitted for AKI on CKD IV , COPD exacerbation, and acute metabolic encephalopathy likely due to uremia. Patient does not want HD.   PMT has been consulted to assist with goals of care conversation.  Clinical Assessment and Goals of Care:  I have reviewed medical records including EPIC notes, labs and imaging, discussed with RN, assessed the patient and then met at the bedside with patient's husband and son to discuss diagnosis prognosis, GOC, EOL wishes, disposition and options.  I introduced Palliative Medicine as specialized medical care for people living with serious illness. It focuses on providing relief from the symptoms and stress of a serious illness. The goal is to improve quality of life for both the patient and the family.  We discussed a brief life review of the patient and then focused on their current illness.  The natural disease trajectory and expectations at EOL were discussed.  I attempted to elicit values and goals of care important to the patient.    Medical History Review and Understanding:  Patient did not recall seeing nephrology yesterday.  We reviewed the MD's note and overall poor prognosis in light of patient's acutely worsening renal function.   Reviewed recommendations for hospice care from both nephrology and primary attending.  Discussed her limited prognosis in light of her acute on chronic disease burden.  Family has a good understanding and patient confirms her understanding as well.  Social History: Patient lives in an independent living apartment [facility] with her husband.  Husband is concerned that she likely would not have enough support if she were to return there.  They would prefer if she had the support in the skilled nursing section of [facility].  Palliative Symptoms: Intermittent confusion, constipation/pain due to rectal prolapse  Code Status: Concepts specific to code status, artifical feeding and hydration, and rehospitalization were considered and discussed.  Patient's family was provided with MOST form yesterday and we discussed briefly in light of her recent decline.  DNR/DNI was confirmed and at this time patient is agreeable to IV medications such as fluids, antibiotics, etc.  Discussion: Patient is quite clear that she would never want dialysis and she understands she has reached the point where she is unlikely to improve or survive much longer without this.  She clearly states her preference for hospice philosophy.  After discussion of the difference between hospice resources at different settings, family understands that insurance would not cover skilled nursing bed though intermittent weekly hospice services would be covered by insurance.  They would like to determine if [facility] would be willing to accept her to the skilled nursing section with hospice support and the cost of this, as the goal would be to keep her as close to her husband as possible and her limited time.  Patient and family understand that residential hospice may  be the most appropriate pending clinical course and feasibility of preferred options.  After discussion of the option for transition to comfort focused care in the hospital  while arranging coordination of hospice referral, patient states her preference to continue with the current care plan and gentle medical management including labs/diagnostics as appropriate.  She would not want aggressive care and understands she has the option to transition to complete comfort focused care if she feels that her symptom burden has become worse.  Patient and family hope that hospice will help improve.  Management of her anxiety/confusion as well as her pain from her prolapsed rectum and arthritis.   The difference between aggressive medical intervention and comfort care was considered in light of the patient's goals of care. Hospice and Palliative Care services outpatient were explained and offered.   Discussed the importance of continued conversation with family and the medical providers regarding overall plan of care and treatment options, ensuring decisions are within the context of the patient's values and GOCs.   Questions and concerns were addressed.  At the family was encouraged to call with questions or concerns.  PMT will continue to support holistically.   SUMMARY OF RECOMMENDATIONS   -Continue DNR/DNI -Continue gentle medical interventions, no escalation or aggressive care -Goal is for discharge with hospice.  Family would prefer if she could return to [facility] to be closer to her husband -Discussed possibility of residential hospice placement if she continues to decline and/or [facility] unable to accept back with hospice -Psychosocial and emotional support provided -PMT will continue to follow and support  Prognosis:  Weeks at best  Discharge Planning:  Hospice, either at [facility] or residential hospice facility       Primary Diagnoses: Present on Admission:  Hyponatremia  COPD exacerbation (HCC)  Coronary artery disease involving native coronary artery of native heart without angina pectoris  Acute renal failure superimposed on stage 4 chronic  kidney disease (HCC)  Acute metabolic encephalopathy  Acquired hypothyroidism  Hyperlipidemia with target LDL less than 70  Acute respiratory failure with hypoxia (HCC)  Leg edema  Knee pain  Anemia due to chronic kidney disease   Physical Exam Vitals and nursing note reviewed.  Constitutional:      General: She is not in acute distress.    Appearance: She is ill-appearing.  Cardiovascular:     Rate and Rhythm: Normal rate.  Pulmonary:     Effort: Pulmonary effort is normal. No respiratory distress.  Skin:    General: Skin is warm and dry.  Neurological:     Mental Status: She is alert.  Psychiatric:        Mood and Affect: Mood normal.        Behavior: Behavior normal.    Vital Signs: BP (!) 137/52   Pulse 94   Temp (!) 97.5 F (36.4 C) (Oral)   Resp (!) 29   Ht 5\' 3"  (1.6 m)   Wt 58.9 kg   SpO2 96%   BMI 23.00 kg/m  Pain Scale: 0-10   Pain Score: 0-No pain   SpO2: SpO2: 96 % O2 Device:SpO2: 96 % O2 Flow Rate: .O2 Flow Rate (L/min): 2 L/min    Total time: I spent 75 minutes in the care of the patient today in the above activities and documenting the encounter.  MDM: High   Niara Bunker Jeni Salles, PA-C  Palliative Medicine Team Team phone # [phone]  Thank you for allowing the Palliative Medicine Team to assist  in the care of this patient. Please utilize secure chat with additional questions, if there is no response within 30 minutes please call the above phone number.  Palliative Medicine Team providers are available by phone from 7am to 7pm daily and can be reached through the team cell phone.  Should this patient require assistance outside of these hours, please call the patient's attending physician.

## 2023-03-17 NOTE — Progress Notes (Signed)
SLP Cancellation Note  Patient Details Name: [name] MRN: [id number] DOB: June 04, 1940   Cancelled treatment:       Reason Eval/Treat Not Completed: Patient declined.   Patient stated "you know I am dying" and when asked declined swallow evaluation.  This was communicated to her attending who reported plan is for comfort at this time.  Given this ST will make no further attempts to complete a swallowing evaluation.  If we can be of further assistance please feel free to reconsult.  Thank you.  Dimas Aguas, MA, CCC-SLP Acute Rehab SLP [phone]  Fleet Contras 03/17/2023, 2:18 PM

## 2023-03-17 NOTE — Progress Notes (Signed)
PROGRESS NOTE    [name]  ZOX:[id number] DOB: July 28, 1940 DOA: 03/16/2023 PCP: [name], MD   Brief Narrative:  82 year old female with history of CAD status post CABG, DVT/PE, CKD stage IV (does not want to be started on HD), COPD, arthritis, hypertension, hyperlipidemia, abdominal aortic aneurysm status postrepair in 1996, asplenia, hypothyroidism, polymyalgia rheumatica, CVA, gout presented with worsening shortness of breath and right knee pain along with lethargy.  On presentation, patient was hypoxic requiring supplemental oxygen.  Creatinine was 7.82.  Nephrology and palliative care were consulted.  She was started on IV antibiotics and Solu-Medrol.  Assessment & Plan:   AKI on CKD stage IV, now progressed to ESRD as per nephrology evaluation -Presented with creatinine of 7.82.  Patient previously expressed desire for conservative care with no hemodialysis which was confirmed with husband by phone on admission per nephrology team. -Creatinine has worsened to 8.29 today.  Nephrology recommended hospice -Palliative care evaluation pending.  Acute respiratory failure with hypoxia COPD exacerbation -Remains on 2 L oxygen by nasal cannula.  Respiratory virus panel and COVID-19 PCR negative.  Chest x-ray showed no acute airspace disease but showed streaky left basilar consolidation likely atelectasis or scarring. -Continue supplemental oxygen.  Continue current nebs.  Continue IV Solu-Medrol -DC Rocephin  Acute metabolic encephalopathy -Possibly from above.  CT head negative for any acute intracranial process.  Monitor mental status.  Fall precautions.  Delirium precautions. -more awake and conversant currently  Chronic knee pain -Presented with worsening right knee pain.  Right knee x-ray shows large suprapatellar effusion but no acute fracture or dislocation. -Await palliative care consultation for now -Continue pain management  Anemia of chronic disease -From  chronic kidney disease.  Hemoglobin stable.  Monitor intermittently  Hyponatremia -Resolved  Acute metabolic acidosis -Worsening.  Bicarb 9 today.  Await palliative care consultation  Hypomagnesemia -Replace  Hyperphosphatemia -From renal failure.  Await palliative care consultation  Lower extremity edema -Possible from renal failure.  Lower extremity ultrasound pending  Goals of care -Awaiting palliative care consultation.  Might benefit from hospice/comfort measures  Hypothyroidism, acquired -Continue levothyroxine  Hyperlipidemia Continue statin  History of gout -Continue allopurinol  History of CAD status post CABG -Stable.  Continue Coreg, Plavix and statin.    DVT prophylaxis: SCDs Code Status: DNR Family Communication: Son at bedside Disposition Plan: Status is: Inpatient Remains inpatient appropriate because: Of severity of illness  Consultants: Nephrology/palliative care  Procedures: None  Antimicrobials: Rocephin from 03/16/2023 onwards   Subjective: Patient seen and examined at bedside.  No agitation, seizures, vomiting reported.  Objective: Vitals:   03/17/23 0545 03/17/23 0600 03/17/23 0630 03/17/23 0644  BP:  112/68 (!) 137/52   Pulse:  97 94   Resp:  17 (!) 29   Temp:    (!) 97.5 F (36.4 C)  TempSrc:    Oral  SpO2: 96% 94% 96%   Weight:      Height:        Intake/Output Summary (Last 24 hours) at 03/17/2023 8119 Last data filed at 03/16/2023 2233 Gross per 24 hour  Intake 100.38 ml  Output --  Net 100.38 ml   Filed Weights   03/16/23 1439  Weight: 58.9 kg    Examination:  General exam: Appears calm and comfortable.  On 2 L oxygen via nasal cannula.  Chronically ill and deconditioned looking Respiratory system: Bilateral decreased breath sounds at bases with scattered crackles and intermittent tachypnea Cardiovascular system: S1 & S2 heard, Rate controlled Gastrointestinal  system: Abdomen is nondistended, soft and  nontender. Normal bowel sounds heard. Extremities: No cyanosis, clubbing; bilateral lower extremity edema.  Right knee swelling and tenderness present Central nervous system: Awake. Answer some questions. Slow to respond. No focal neurological deficits. Moving extremities Skin: No rashes, lesions or ulcers Psychiatry: Flat affect.  Not agitated.    Data Reviewed: I have personally reviewed following labs and imaging studies  CBC: Recent Labs  Lab 03/12/23 1118 03/16/23 1420 03/17/23 0323  WBC  --  10.3 9.3  NEUTROABS  --  7.7  --   HGB 8.6* 7.8* 8.2*  HCT  --  23.6* 24.7*  MCV  --  95.9 97.2  PLT  --  242 281   Basic Metabolic Panel: Recent Labs  Lab 03/16/23 1420 03/16/23 2205 03/17/23 0323  NA 132*  --  136  K 3.5  --  3.9  CL 102  --  103  CO2 12*  --  9*  GLUCOSE 113*  --  147*  BUN 136*  --  144*  CREATININE 7.82*  --  8.29*  CALCIUM 7.4*  --  7.4*  MG  --  1.0* 1.6*  PHOS  --  10.0* 10.6*   GFR: Estimated Creatinine Clearance: 4.2 mL/min (A) (by C-G formula based on SCr of 8.29 mg/dL (H)). Liver Function Tests: Recent Labs  Lab 03/16/23 1420 03/17/23 0323  AST 21 21  ALT 16 19  ALKPHOS 69 74  BILITOT 0.9 0.7  PROT 4.7* 5.1*  ALBUMIN 2.2* 2.3*   No results for input(s): "LIPASE", "AMYLASE" in the last 168 hours. Recent Labs  Lab 03/16/23 2205  AMMONIA <10   Coagulation Profile: No results for input(s): "INR", "PROTIME" in the last 168 hours. Cardiac Enzymes: Recent Labs  Lab 03/16/23 2205  CKTOTAL 78   BNP (last 3 results) No results for input(s): "PROBNP" in the last 8760 hours. HbA1C: No results for input(s): "HGBA1C" in the last 72 hours. CBG: No results for input(s): "GLUCAP" in the last 168 hours. Lipid Profile: No results for input(s): "CHOL", "HDL", "LDLCALC", "TRIG", "CHOLHDL", "LDLDIRECT" in the last 72 hours. Thyroid Function Tests: Recent Labs    03/16/23 2205  TSH 1.270  FREET4 1.07   Anemia Panel: Recent Labs     03/16/23 2205 03/17/23 0323  VITAMINB12  --  6,237*  FOLATE  --  >40.0  FERRITIN 489*  --   TIBC 147*  --   IRON 19*  --   RETICCTPCT 3.1  --    Sepsis Labs: Recent Labs  Lab 03/16/23 2205 03/16/23 2320  PROCALCITON 0.52  --   LATICACIDVEN 0.8 0.6    Recent Results (from the past 240 hour(s))  SARS Coronavirus 2 by RT PCR (hospital order, performed in [facility] hospital lab) *cepheid single result test* Anterior Nasal Swab     Status: None   Collection Time: 03/16/23  3:25 PM   Specimen: Anterior Nasal Swab  Result Value Ref Range Status   SARS Coronavirus 2 by RT PCR NEGATIVE NEGATIVE Final    Comment: Performed at [facility] Lab, [address] [place], [place] [address]  Respiratory (~20 pathogens) panel by PCR     Status: None   Collection Time: 03/16/23  7:36 PM   Specimen: Nasopharyngeal Swab; Respiratory  Result Value Ref Range Status   Adenovirus NOT DETECTED NOT DETECTED Final   Coronavirus 229E NOT DETECTED NOT DETECTED Final    Comment: (NOTE) The Coronavirus on the Respiratory Panel,  DOES NOT test for the novel  Coronavirus (2019 nCoV)    Coronavirus HKU1 NOT DETECTED NOT DETECTED Final   Coronavirus NL63 NOT DETECTED NOT DETECTED Final   Coronavirus OC43 NOT DETECTED NOT DETECTED Final   Metapneumovirus NOT DETECTED NOT DETECTED Final   Rhinovirus / Enterovirus NOT DETECTED NOT DETECTED Final   Influenza A NOT DETECTED NOT DETECTED Final   Influenza B NOT DETECTED NOT DETECTED Final   Parainfluenza Virus 1 NOT DETECTED NOT DETECTED Final   Parainfluenza Virus 2 NOT DETECTED NOT DETECTED Final   Parainfluenza Virus 3 NOT DETECTED NOT DETECTED Final   Parainfluenza Virus 4 NOT DETECTED NOT DETECTED Final   Respiratory Syncytial Virus NOT DETECTED NOT DETECTED Final   Bordetella pertussis NOT DETECTED NOT DETECTED Final   Bordetella Parapertussis NOT DETECTED NOT DETECTED Final   Chlamydophila pneumoniae NOT DETECTED NOT DETECTED Final   Mycoplasma  pneumoniae NOT DETECTED NOT DETECTED Final    Comment: Performed at [facility], [address] [place], [place] [address]  MRSA Next Gen by PCR, Nasal     Status: None   Collection Time: 03/17/23  3:23 AM   Specimen: Nasal Mucosa; Nasal Swab  Result Value Ref Range Status   MRSA by PCR Next Gen NOT DETECTED NOT DETECTED Final    Comment: (NOTE) The GeneXpert MRSA Assay (FDA approved for NASAL specimens only), is one component of a comprehensive MRSA colonization surveillance program. It is not intended to diagnose MRSA infection nor to guide or monitor treatment for MRSA infections. Test performance is not FDA approved in patients less than 30 years old. Performed at [facility], [address], [place], [place] [address]          Radiology Studies: CT HEAD WO CONTRAST ( )  Result Date: 03/16/2023 CLINICAL DATA:  Delirium EXAM: CT HEAD WITHOUT CONTRAST TECHNIQUE: Contiguous axial images were obtained from the base of the skull through the vertex without intravenous contrast. RADIATION DOSE REDUCTION: This exam was performed according to the departmental dose-optimization program which includes automated exposure control, adjustment of the mA and/or kV according to patient size and/or use of iterative reconstruction technique. COMPARISON:  02/15/2023 FINDINGS: Brain: No acute infarct or hemorrhage. Lateral ventricles and midline structures are unremarkable. No acute extra-axial fluid collections. No mass effect. Stable diffuse cerebral atrophy. Vascular: Stable atherosclerosis.  No hyperdense vessel. Skull: Normal. Negative for fracture or focal lesion. Sinuses/Orbits: No acute finding. Other: None. IMPRESSION: 1. Stable head CT, no acute intracranial process. Electronically Signed   By: Sharlet Salina M.D.   On: 03/16/2023 22:17   DG Knee 1-2 Views Right  Result Date: 03/16/2023 CLINICAL DATA:  Right knee pain. EXAM: RIGHT KNEE - 1-2 VIEW COMPARISON:  Right knee  radiograph dated 02/18/2023. FINDINGS: There is no acute fracture or dislocation. The bones are osteopenic. There is degenerative changes with moderate narrowing of the lateral compartment. There is a large suprapatellar effusion. The soft tissues are unremarkable. IMPRESSION: 1. No acute fracture or dislocation. 2. Large suprapatellar effusion. Electronically Signed   By: Elgie Collard M.D.   On: 03/16/2023 21:11   DG Chest Portable 1 View  Result Date: 03/16/2023 CLINICAL DATA:  Short of breath EXAM: PORTABLE CHEST 1 VIEW COMPARISON:  02/15/2023 FINDINGS: Single frontal view of the chest demonstrates stable postsurgical changes from CABG. The cardiac silhouette is unremarkable. Interval development of trace bilateral pleural effusions. Streaky opacities at the left lung base could reflect atelectasis or scarring. No acute  airspace disease or pneumothorax. No acute bony abnormality. IMPRESSION: 1. Trace bilateral pleural effusions. 2. Streaky left basilar consolidation likely atelectasis or scarring. 3. No acute airspace disease. Electronically Signed   By: Sharlet Salina M.D.   On: 03/16/2023 17:46        Scheduled Meds:  allopurinol  100 mg Oral Daily   atorvastatin  80 mg Oral Daily   carvedilol  6.25 mg Oral BID WC   clopidogrel  75 mg Oral QODAY   ipratropium-albuterol  3 mL Nebulization Q6H   leflunomide  20 mg Oral Daily   levothyroxine  88 mcg Oral QAC breakfast   methylPREDNISolone (SOLU-MEDROL) injection  40 mg Intravenous Q12H   Followed by   Melene Muller ON 03/18/2023] predniSONE  40 mg Oral Q breakfast   pantoprazole  40 mg Oral Daily   sertraline  25 mg Oral Daily   Continuous Infusions:  cefTRIAXone (ROCEPHIN)  IV Stopped (03/16/23 2233)          Glade Lloyd, MD [facility] 03/17/2023, 8:22 AM

## 2023-03-17 NOTE — Progress Notes (Signed)
SLP Cancellation Note  Patient Details Name: [name] MRN: [id number] DOB: Jan 05, 1941   Cancelled treatment:       Reason Eval/Treat Not Completed: Patient at procedure or test/unavailable  ST will continue efforts to complete swallowing evaluation.   Dimas Aguas, MA, CCC-SLP Acute Rehab SLP [phone]  [name] 03/17/2023, 11:27 AM

## 2023-03-17 NOTE — ED Notes (Signed)
ED TO INPATIENT HANDOFF REPORT  ED Nurse Name and Phone #: # [phone]  S Name/Age/Gender [name] 82 y.o. female Room/Bed: 036C/036C  Code Status   Code Status: Limited: Do not attempt resuscitation (DNR) -DNR-LIMITED -Do Not Intubate/DNI   Home/SNF/Other Home Patient oriented to: self, place, time, and situation Is this baseline? Yes   Triage Complete: Triage complete  Chief Complaint COPD exacerbation [facility]) [J44.1]  Triage Note Patient presents to ed via [facility] from [facility] c/o sob onset yest however patient states she hasn't been feeling well for several days. Yest per ems staff states patients sat were in the low 80's placed patient on 2 L/[place] and sat came up to the low 90's , states [facility] MD rounded on her today and she was c/o sob and right  knee being swollen. And wanted her sent out for further eval. Upon arrival patient is alert oriented, states she just doesn't feel good, and her right knee is hurting , states she had to have fluid drawn off knee when she was in the hospital last .    Allergies Allergies  Allergen Reactions   Nsaids Other (See Comments)    Stomach upset Told to avoid due to Eliquis   Aspirin Other (See Comments)    Stomach upset Told to avoid due to Eliquis   Codeine Nausea And Vomiting   Eliquis [Apixaban] Other (See Comments)    Gi upset    Nitrostat [Nitroglycerin] Other (See Comments)    Bradycardia. Drop in heart rate    Tramadol Other (See Comments)    Confusion    Level of Care/Admitting Diagnosis ED Disposition     ED Disposition  Admit   Condition  --   Comment  Hospital Area: [facility] [[id number]]  Level of Care: Progressive [102]  Admit to Progressive based on following criteria: RESPIRATORY PROBLEMS hypoxemic/hypercapnic respiratory failure that is responsive to NIPPV (BiPAP) or High Flow Nasal Cannula (6-80 lpm). Frequent assessment/intervention, no > Q2 hrs < Q4 hrs, to maintain oxygenation and  pulmonary hygiene.  May admit patient to [facility] or [facility] if equivalent level of care is available:: No  Covid Evaluation: Asymptomatic - no recent exposure (last 10 days) testing not required  Diagnosis: COPD exacerbation [facility]) [[id number]]  Admitting Physician: Therisa Doyne [[id number]]  Attending Physician: Therisa Doyne [[id number]]  Certification:: I certify this patient will need inpatient services for at least 2 midnights  Expected Medical Readiness: 03/19/2023          B Medical/Surgery History Past Medical History:  Diagnosis Date   Abdominal aortic aneurysm (HCC)    REPAIRED IN 1996 BY [name]  AND HAS RECENTLY BEEN FOLLOWED BY [name]   Acquired asplenia     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 Most likely scar. Mild subendocardial ischemia. EF 71% LOW RISK.    Cervical disc disease    fracture   Chronic kidney disease  CKD4   COPD (chronic obstructive pulmonary disease) (HCC)    Diverticulosis    DVT (deep venous thrombosis) (HCC)    RLE; Sept '23   History of DVT (deep vein thrombosis) 03/06/2022   History of pulmonary embolus (PE)    Oct '23   Hyperlipidemia    Hypertension    Hypothyroidism (acquired)    hypo   Myocardial infarction (HCC) 11/2014   TIA   Polymyalgia rheumatica (HCC)    2011 Dr. Kellie Simmering   Pulmonary emboli [facility]) 02/06/2022   01/2022 PE/DVT post-op. Lower GI bleed     Treat edema  Start Eliquis at the reduced dose due to Creat>1.5 and age >89    Rheumatoid arthritis (HCC) 2011   Dr.Truslow; fracture knees, hands and wrists -    S/P CABG x 1 1996   CABG--LIMA-LAD for ostial LAD (not felt to be PCI amenable). EF NORMAL then; LIMA now atretic   Shortness of breath dyspnea    with exertion   Stroke (HCC) 11/2014   TIA    Urinary frequency    Past Surgical History:  Procedure Laterality Date   ABDOMINAL AORTIC ANEURYSM REPAIR  1996   Complicated by mesenteric artery stenosis and splenic artery infarction with acquired Asplenia   APPENDECTOMY     BIOPSY  07/08/2022   Procedure: BIOPSY;  Surgeon: Benancio Deeds, MD;  Location: [facility] ENDOSCOPY;  Service: Gastroenterology;;   BIOPSY  11/28/2022   Procedure: BIOPSY;  Surgeon: [name], MD;  Location: [facility] ENDOSCOPY;  Service: Gastroenterology;;   [name]  07/2011   right foot   CARDIAC CATHETERIZATION  2005   (Most recent CATH) - ostial LAD lesion 20-30% (down from 90% initially). Atretic LIMA. Minimal disease the RCA and Circumflex system.   CARPAL TUNNEL RELEASE Left    CATARACT EXTRACTION Bilateral    CERVICAL SPINE SURGERY     plate [id number] Dr. Wynetta Emery   COLONOSCOPY     COLONOSCOPY WITH PROPOFOL N/A 11/28/2022   Procedure: COLONOSCOPY WITH PROPOFOL;  Surgeon: [name], MD;  Location[facility] END[facility];  Service: Gastroenterology;  Laterality: N/A;   CORONARY ARTERY BYPASS GRAFT  1996   INCLUDED AN INTERNAL MAMMARY ARTERY TO THE LAD. EF WAS NORMAL   ESOPHAGOGASTRODUODENOSCOPY (EGD) WITH PROPOFOL N/A 07/08/2022   Procedure: ESOPHAGOGASTRODUODENOSCOPY (EGD) WITH PROPOFOL;  Surgeon: Benancio Deeds, MD;  Location: [facility] ENDOSCOPY;  Service: Gastroenterology;  Laterality: N/A;   ESOPHAGOGASTRODUODENOSCOPY (EGD) WITH PROPOFOL N/A 11/28/2022   Procedure: ESOPHAGOGASTRODUODENOSCOPY (EGD) WITH PROPOFOL;  Surgeon: [name], MD;  Location: [facility] ENDOSCOPY;  Service: Gastroenterology;  Laterality: N/A;   INGUINAL HERNIA REPAIR Right    LAPAROSCOPIC APPENDECTOMY N/A 06/28/2016    Procedure: APPENDECTOMY LAPAROSCOPIC;  Surgeon: [name], MD;  Location: [facility] ORS;  Service: General;  Laterality: N/A;   NM MYOVIEW LTD  06/2010   Fixed anteroseptal, apical and inferoapical defect with moderate size. Most likely scar. Mild subendocardial ischemia. EF 71% LOW RISK.    POLYPECTOMY  11/28/2022   Procedure: POLYPECTOMY;  Surgeon: [name], MD;  Location: [facility] ENDOSCOPY;  Service: Gastroenterology;;   SPLENECTOMY     TOTAL HIP ARTHROPLASTY Left 01/23/2022   Procedure: LEFT TOTAL HIP ARTHROPLASTY ANTERIOR APPROACH;  Surgeon: [name], MD;  Location: [facility] OR;  Service: Orthopedics;  Laterality: Left;  3-C   TRANSTHORACIC ECHOCARDIOGRAM  12/2014   [facility]: Normal LV size & function. EF 55-60%,    vagina polyp     VIDEO ASSISTED THORACOSCOPY (VATS)/WEDGE  RESECTION Left 03/02/2015   Procedure: VIDEO ASSISTED THORACOSCOPY (VATS), MINI THORACOTOMY, LEFT UPPER LOBE WEDGE, TAKE DOWN OF INTERNAL MAMMARY LESIONS, PLACEMENT OF ON-Q PUMP;  Surgeon: [name], MD;  Location: [facility] OR;  Service: Thoracic;  Laterality: Left;   VIDEO BRONCHOSCOPY N/A 03/02/2015   Procedure: BRONCHOSCOPY;  Surgeon: [name], MD;  Location: [facility] OR;  Service: Thoracic;  Laterality: N/A;   VIDEO BRONCHOSCOPY WITH ENDOBRONCHIAL NAVIGATION N/A 10/08/2017   Procedure: VIDEO BRONCHOSCOPY WITH ENDOBRONCHIAL NAVIGATION WITH BIOPSIES OF LEFT UPPER LOBE AND LEFT LOWER LOBE;  Surgeon: [name], MD;  Location: [facility] OR;  Service: Thoracic;  Laterality: N/A;     A IV Location/Drains/Wounds Patient Lines/Drains/Airways Status     Active Line/Drains/Airways     Name Placement date Placement time Site Days   Peripheral IV 03/16/23 20 G Anterior;Right Forearm 03/16/23  1443  Forearm  1   Wound / Incision (Open or Dehisced) 02/17/22 Irritant Dermatitis (Moisture Associated Skin Damage) Perineum Bilateral 02/17/22  0130  Perineum  393            Intake/Output Last 24  hours  Intake/Output Summary (Last 24 hours) at 03/17/2023 0615 Last data filed at 03/16/2023 2233 Gross per 24 hour  Intake 100.38 ml  Output --  Net 100.38 ml    Labs/Imaging Results for orders placed or performed during the hospital encounter of 03/16/23 (from the past 48 hour(s))  CBC with Differential     Status: Abnormal   Collection Time: 03/16/23  2:20 PM  Result Value Ref Range   WBC 10.3 4.0 - 10.5 K/uL   RBC 2.46 (L) 3.87 - 5.11 MIL/uL   Hemoglobin 7.8 (L) 12.0 - 15.0 g/dL   HCT 81.1 (L) 91.4 - 78.2 %   MCV 95.9 80.0 - 100.0 fL   MCH 31.7 26.0 - 34.0 pg   MCHC 33.1 30.0 - 36.0 g/dL   RDW 95.6 (H) 21.3 - 08.6 %   Platelets 242 150 - 400 K/uL   nRBC 0.3 (H) 0.0 - 0.2 %   Neutrophils Relative % 73 %   Neutro Abs 7.7 1.7 - 7.7 K/uL   Lymphocytes Relative 8 %   Lymphs Abs 0.9 0.7 - 4.0 K/uL   Monocytes Relative 16 %   Monocytes Absolute 1.6 (H) 0.1 - 1.0 K/uL   Eosinophils Relative 1 %   Eosinophils Absolute 0.1 0.0 - 0.5 K/uL   Basophils Relative 1 %   Basophils Absolute 0.1 0.0 - 0.1 K/uL   Immature Granulocytes 1 %   Abs Immature Granulocytes 0.10 (H) 0.00 - 0.07 K/uL    Comment: Performed at [facility], [address] [place], [place] [address]  Comprehensive metabolic panel     Status: Abnormal   Collection Time: 03/16/23  2:20 PM  Result Value Ref Range   Sodium 132 (L) 135 - 145 mmol/L   Potassium 3.5 3.5 - 5.1 mmol/L   Chloride 102 98 - 111 mmol/L   CO2 12 (L) 22 - 32 mmol/L   Glucose, Bld 113 (H) 70 - 99 mg/dL    Comment: Glucose reference range applies only to samples taken after fasting for at least 8 hours.   BUN 136 (H) 8 - 23 mg/dL   Creatinine, Ser 9.62 (H) 0.44 - 1.00 mg/dL   Calcium 7.4 (L) 8.9 - 10.3 mg/dL   Total Protein 4.7 (L) 6.5 - 8.1 g/dL   Albumin 2.2 (L) 3.5 - 5.0 g/dL   AST  21 15 - 41 U/L   ALT 16 0 - 44 U/L   Alkaline Phosphatase 69 38 - 126 U/L   Total Bilirubin 0.9 <1.2 mg/dL   GFR, Estimated 5 (L) >60 mL/min     Comment: (NOTE) Calculated using the CKD-EPI Creatinine Equation (2021)    Anion gap 18 (H) 5 - 15    Comment: Performed at [facility], [address] [place], [place] [address]  Troponin I (High Sensitivity)     Status: Abnormal   Collection Time: 03/16/23  2:20 PM  Result Value Ref Range   Troponin I (High Sensitivity) 80 (H) <18 ng/L    Comment: (NOTE) Elevated high sensitivity troponin I (hsTnI) values and significant  changes across serial measurements may suggest ACS but many other  chronic and acute conditions are known to elevate hsTnI results.  Refer to the "Links" section for chest pain algorithms and additional  guidance. Performed at [facility], [address] [place], [place] [address]   Brain natriuretic peptide     Status: Abnormal   Collection Time: 03/16/23  2:20 PM  Result Value Ref Range   B Natriuretic Peptide 783.2 (H) 0.0 - 100.0 pg/mL    Comment: Performed at [facility], [address], [place], [place] [address]  SARS Coronavirus 2 by RT PCR (hospital order, performed in [facility] hospital lab) *cepheid single result test* Anterior Nasal Swab     Status: None   Collection Time: 03/16/23  3:25 PM   Specimen: Anterior Nasal Swab  Result Value Ref Range   SARS Coronavirus 2 by RT PCR NEGATIVE NEGATIVE    Comment: Performed at [facility], [address] [place], [place] [address]  Respiratory (~20 pathogens) panel by PCR     Status: None   Collection Time: 03/16/23  7:36 PM   Specimen: Nasopharyngeal Swab; Respiratory  Result Value Ref Range   Adenovirus NOT DETECTED NOT DETECTED   Coronavirus 229E NOT DETECTED NOT DETECTED    Comment: (NOTE) The Coronavirus on the Respiratory Panel, DOES NOT test for the novel  Coronavirus (2019 nCoV)    Coronavirus HKU1 NOT DETECTED NOT DETECTED   Coronavirus NL63 NOT DETECTED NOT DETECTED   Coronavirus OC43 NOT DETECTED NOT DETECTED   Metapneumovirus NOT DETECTED NOT DETECTED    Rhinovirus / Enterovirus NOT DETECTED NOT DETECTED   Influenza A NOT DETECTED NOT DETECTED   Influenza B NOT DETECTED NOT DETECTED   Parainfluenza Virus 1 NOT DETECTED NOT DETECTED   Parainfluenza Virus 2 NOT DETECTED NOT DETECTED   Parainfluenza Virus 3 NOT DETECTED NOT DETECTED   Parainfluenza Virus 4 NOT DETECTED NOT DETECTED   Respiratory Syncytial Virus NOT DETECTED NOT DETECTED   Bordetella pertussis NOT DETECTED NOT DETECTED   Bordetella Parapertussis NOT DETECTED NOT DETECTED   Chlamydophila pneumoniae NOT DETECTED NOT DETECTED   Mycoplasma pneumoniae NOT DETECTED NOT DETECTED    Comment: Performed at [facility], [address] [place], [place] [address]  Type and screen [facility]     Status: None   Collection Time: 03/16/23  7:48 PM  Result Value Ref Range   ABO/RH(D) O POS    Antibody Screen NEG    Sample Expiration      03/19/2023,2359 Performed at [facility], [address], [place], [place] [address]   Iron and TIBC     Status: Abnormal   Collection Time: 03/16/23 10:05 PM  Result Value Ref Range   Iron 19 (L) 28 - 170 ug/dL   TIBC 161 (L) 096 - 045 ug/dL   Saturation Ratios 13 10.4 - 31.8 %   UIBC 128 ug/dL    Comment: Performed at [facility], [address], [place], [place] [address]  Ferritin     Status: Abnormal   Collection Time: 03/16/23 10:05 PM  Result Value Ref Range   Ferritin 489 (H) 11 - 307 ng/mL    Comment: Performed at [facility], [address], [place], [place] [address]  Reticulocytes     Status: Abnormal   Collection Time: 03/16/23 10:05 PM  Result Value Ref Range   Retic Ct Pct 3.1 0.4 - 3.1 %   RBC. 2.50 (L) 3.87 - 5.11 MIL/uL   Retic Count, Absolute 77.0 19.0 - 186.0 K/uL   Immature Retic Fract 4.6 2.3 - 15.9 %    Comment: Performed at [facility], [address], [place], [place] [address]  Procalcitonin     Status: None   Collection Time: 03/16/23 10:05 PM  Result Value Ref  Range   Procalcitonin 0.52 ng/mL    Comment:        Interpretation: PCT > 0.5 ng/mL and <= 2 ng/mL: Systemic infection (sepsis) is possible, but other conditions are known to elevate PCT as well. (NOTE)       Sepsis PCT Algorithm           Lower Respiratory Tract                                      Infection PCT Algorithm    ----------------------------     ----------------------------         PCT < 0.25 ng/mL                PCT < 0.10 ng/mL          Strongly encourage             Strongly discourage   discontinuation of antibiotics    initiation of antibiotics    ----------------------------     -----------------------------       PCT 0.25 - 0.50 ng/mL            PCT 0.10 - 0.25 ng/mL               OR       >80% decrease in PCT            Discourage initiation of                                            antibiotics      Encourage discontinuation           of antibiotics    ----------------------------     -----------------------------         PCT >= 0.50 ng/mL              PCT 0.26 - 0.50 ng/mL                AND       <80% decrease in PCT             Encourage initiation of  antibiotics       Encourage continuation           of antibiotics    ----------------------------     -----------------------------        PCT >= 0.50 ng/mL                  PCT > 0.50 ng/mL               AND         increase in PCT                  Strongly encourage                                      initiation of antibiotics    Strongly encourage escalation           of antibiotics                                     -----------------------------                                           PCT <= 0.25 ng/mL                                                 OR                                        > 80% decrease in PCT                                      Discontinue / Do not initiate                                             antibiotics  Performed at  [facility], [address], [place], [place] [address]   T4, free     Status: None   Collection Time: 03/16/23 10:05 PM  Result Value Ref Range   Free T4 1.07 0.61 - 1.12 ng/dL    Comment: (NOTE) Biotin ingestion may interfere with free T4 tests. If the results are inconsistent with the TSH level, previous test results, or the clinical presentation, then consider biotin interference. If needed, order repeat testing after stopping biotin. Performed at [facility], [address], [place], [place] [address]   Ammonia     Status: None   Collection Time: 03/16/23 10:05 PM  Result Value Ref Range   Ammonia <10 9 - 35 umol/L    Comment: Performed at [facility], [address], [place], [place] [address]  CK     Status: None   Collection Time: 03/16/23 10:05 PM  Result Value Ref Range   Total CK 78 38 -  234 U/L    Comment: Performed at [facility], [address], [place], [place] [address]  Magnesium     Status: Abnormal   Collection Time: 03/16/23 10:05 PM  Result Value Ref Range   Magnesium 1.0 (L) 1.7 - 2.4 mg/dL    Comment: Performed at [facility], [address] [place], [place] [address]  Phosphorus     Status: Abnormal   Collection Time: 03/16/23 10:05 PM  Result Value Ref Range   Phosphorus 10.0 (H) 2.5 - 4.6 mg/dL    Comment: Performed at [facility], [address] [place], [place] [address]  Osmolality     Status: Abnormal   Collection Time: 03/16/23 10:05 PM  Result Value Ref Range   Osmolality 326 (HH) 275 - 295 mOsm/kg    Comment: REPEATED TO VERIFY CRITICAL RESULT CALLED TO, READ BACK BY AND VERIFIED WITH: ROBERTS,C RN 2336 03/16/23 AMIREHSANI F Performed at [facility], [address], [place], [place] [address]   TSH     Status: None   Collection Time: 03/16/23 10:05 PM  Result Value Ref Range   TSH 1.270 0.350 - 4.500 uIU/mL    Comment: Performed by a 3rd Generation assay with a functional sensitivity of  <=0.01 uIU/mL. Performed at [facility], [address] [place], [place] [address]   Troponin I (High Sensitivity)     Status: Abnormal   Collection Time: 03/16/23 10:05 PM  Result Value Ref Range   Troponin I (High Sensitivity) 75 (H) <18 ng/L    Comment: (NOTE) Elevated high sensitivity troponin I (hsTnI) values and significant  changes across serial measurements may suggest ACS but many other  chronic and acute conditions are known to elevate hsTnI results.  Refer to the "Links" section for chest pain algorithms and additional  guidance. Performed at [facility], [address], [place], [place] [address]   Lactic acid, plasma     Status: None   Collection Time: 03/16/23 10:05 PM  Result Value Ref Range   Lactic Acid, Venous 0.8 0.5 - 1.9 mmol/L    Comment: Performed at [facility], [address], [place], [place] [address]  Lactic acid, plasma     Status: None   Collection Time: 03/16/23 11:20 PM  Result Value Ref Range   Lactic Acid, Venous 0.6 0.5 - 1.9 mmol/L    Comment: Performed at [facility], [address] [place], [place] [address]  Vitamin B12     Status: Abnormal   Collection Time: 03/17/23  3:23 AM  Result Value Ref Range   Vitamin B-12 6,237 (H) 180 - 914 pg/mL    Comment: (NOTE) This assay is not validated for testing neonatal or myeloproliferative syndrome specimens for Vitamin B12 levels. Performed at [facility], [address], [place], [place] [address]   Folate     Status: None   Collection Time: 03/17/23  3:23 AM  Result Value Ref Range   Folate >40.0 >5.9 ng/mL    Comment: Performed at [facility], [address] [place], [place] [address]  Creatinine, urine, random     Status: None   Collection Time: 03/17/23  3:23 AM  Result Value Ref Range   Creatinine, Urine 98 mg/dL    Comment: Performed at [facility], [address] [place], [place] [address]  Sodium, urine, random     Status: None  Collection Time: 03/17/23  3:23 AM  Result Value Ref Range   Sodium, Ur 20 mmol/L    Comment: Performed at [facility], [address] [place], [place] [address]  Prealbumin     Status: Abnormal   Collection Time: 03/17/23  3:23 AM  Result Value Ref Range   Prealbumin 8 (L) 18 - 38 mg/dL    Comment: Performed at [facility], [address] [place], [place] [address]  Urinalysis, Complete w Microscopic -Urine, Clean Catch     Status: Abnormal   Collection Time: 03/17/23  3:23 AM  Result Value Ref Range   Color, Urine AMBER (A) YELLOW    Comment: BIOCHEMICALS MAY BE AFFECTED BY COLOR   APPearance CLOUDY (A) CLEAR   Specific Gravity, Urine 1.012 1.005 - 1.030   pH 5.0 5.0 - 8.0   Glucose, UA NEGATIVE NEGATIVE mg/dL   Hgb urine dipstick NEGATIVE NEGATIVE   Bilirubin Urine NEGATIVE NEGATIVE   Ketones, ur NEGATIVE NEGATIVE mg/dL   Protein, ur >=098 (A) NEGATIVE mg/dL   Nitrite NEGATIVE NEGATIVE   Leukocytes,Ua LARGE (A) NEGATIVE   RBC / HPF 0-5 0 - 5 RBC/hpf   WBC, UA >50 0 - 5 WBC/hpf   Bacteria, UA RARE (A) NONE SEEN   Squamous Epithelial / HPF 0-5 0 - 5 /HPF   WBC Clumps PRESENT    Budding Yeast PRESENT    Granular Casts, UA PRESENT     Comment: Performed at [facility], [address] [place], [place] [address]  Magnesium     Status: Abnormal   Collection Time: 03/17/23  3:23 AM  Result Value Ref Range   Magnesium 1.6 (L) 1.7 - 2.4 mg/dL    Comment: Performed at [facility], [address], [place], [place] [address]  Phosphorus     Status: Abnormal   Collection Time: 03/17/23  3:23 AM  Result Value Ref Range   Phosphorus 10.6 (H) 2.5 - 4.6 mg/dL    Comment: Performed at [facility], [address], [place], [place] [address]  Comprehensive metabolic panel     Status: Abnormal   Collection Time: 03/17/23  3:23 AM  Result Value Ref Range   Sodium 136 135 - 145 mmol/L   Potassium 3.9 3.5 - 5.1 mmol/L   Chloride 103 98 - 111 mmol/L    CO2 9 (L) 22 - 32 mmol/L   Glucose, Bld 147 (H) 70 - 99 mg/dL    Comment: Glucose reference range applies only to samples taken after fasting for at least 8 hours.   BUN 144 (H) 8 - 23 mg/dL   Creatinine, Ser 8.65 (H) 0.44 - 1.00 mg/dL   Calcium 7.4 (L) 8.9 - 10.3 mg/dL   Total Protein 5.1 (L) 6.5 - 8.1 g/dL   Albumin 2.3 (L) 3.5 - 5.0 g/dL   AST 21 15 - 41 U/L   ALT 19 0 - 44 U/L   Alkaline Phosphatase 74 38 - 126 U/L   Total Bilirubin 0.7 <1.2 mg/dL   GFR, Estimated 4 (L) >60 mL/min    Comment: (NOTE) Calculated using the CKD-EPI Creatinine Equation (2021)    Anion gap 24 (H) 5 - 15    Comment: ELECTROLYTES REPEATED TO VERIFY Performed at [facility], [address], [place], [place] [address]   CBC     Status: Abnormal   Collection Time: 03/17/23  3:23 AM  Result Value Ref Range  WBC 9.3 4.0 - 10.5 K/uL   RBC 2.54 (L) 3.87 - 5.11 MIL/uL   Hemoglobin 8.2 (L) 12.0 - 15.0 g/dL   HCT 16.1 (L) 09.6 - 04.5 %   MCV 97.2 80.0 - 100.0 fL   MCH 32.3 26.0 - 34.0 pg   MCHC 33.2 30.0 - 36.0 g/dL   RDW 40.9 (H) 81.1 - 91.4 %   Platelets 281 150 - 400 K/uL   nRBC 0.5 (H) 0.0 - 0.2 %    Comment: Performed at [facility] Lab, [address], [place], [place] [address]  MRSA Next Gen by PCR, Nasal     Status: None   Collection Time: 03/17/23  3:23 AM   Specimen: Nasal Mucosa; Nasal Swab  Result Value Ref Range   MRSA by PCR Next Gen NOT DETECTED NOT DETECTED    Comment: (NOTE) The GeneXpert MRSA Assay (FDA approved for NASAL specimens only), is one component of a comprehensive MRSA colonization surveillance program. It is not intended to diagnose MRSA infection nor to guide or monitor treatment for MRSA infections. Test performance is not FDA approved in patients less than 29 years old. Performed at [facility] [facility], [address], [place], [place] [address]   Troponin I (High Sensitivity)     Status: Abnormal   Collection Time: 03/17/23  3:23 AM  Result Value Ref  Range   Troponin I (High Sensitivity) 76 (H) <18 ng/L    Comment: (NOTE) Elevated high sensitivity troponin I (hsTnI) values and significant  changes across serial measurements may suggest ACS but many other  chronic and acute conditions are known to elevate hsTnI results.  Refer to the "Links" section for chest pain algorithms and additional  guidance. Performed at [facility], [address], [place], [place] [address]    *Note: Due to a large number of results and/or encounters for the requested time period, some results have not been displayed. A complete set of results can be found in Results Review.   CT HEAD WO CONTRAST ( )  Result Date: 03/16/2023 CLINICAL DATA:  Delirium EXAM: CT HEAD WITHOUT CONTRAST TECHNIQUE: Contiguous axial images were obtained from the base of the skull through the vertex without intravenous contrast. RADIATION DOSE REDUCTION: This exam was performed according to the departmental dose-optimization program which includes automated exposure control, adjustment of the mA and/or kV according to patient size and/or use of iterative reconstruction technique. COMPARISON:  02/15/2023 FINDINGS: Brain: No acute infarct or hemorrhage. Lateral ventricles and midline structures are unremarkable. No acute extra-axial fluid collections. No mass effect. Stable diffuse cerebral atrophy. Vascular: Stable atherosclerosis.  No hyperdense vessel. Skull: Normal. Negative for fracture or focal lesion. Sinuses/Orbits: No acute finding. Other: None. IMPRESSION: 1. Stable head CT, no acute intracranial process. Electronically Signed   By: Sharlet Salina M.D.   On: 03/16/2023 22:17   DG Knee 1-2 Views Right  Result Date: 03/16/2023 CLINICAL DATA:  Right knee pain. EXAM: RIGHT KNEE - 1-2 VIEW COMPARISON:  Right knee radiograph dated 02/18/2023. FINDINGS: There is no acute fracture or dislocation. The bones are osteopenic. There is degenerative changes with moderate narrowing of the  lateral compartment. There is a large suprapatellar effusion. The soft tissues are unremarkable. IMPRESSION: 1. No acute fracture or dislocation. 2. Large suprapatellar effusion. Electronically Signed   By: Elgie Collard M.D.   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 The cardiac silhouette is unremarkable. Interval development of trace bilateral pleural effusions. Streaky opacities at the left lung base could reflect atelectasis or scarring. No acute airspace disease or pneumothorax. No acute bony abnormality. IMPRESSION: 1. Trace bilateral pleural effusions. 2. Streaky left basilar consolidation likely atelectasis or scarring. 3. No acute airspace disease. Electronically Signed   By: Sharlet Salina M.D.   On: 03/16/2023 17:46    Pending Labs Unresulted Labs (From admission, onward)     Start     Ordered   03/16/23 2152  Expectorated Sputum Assessment w Gram Stain, Rflx to Resp Cult  (COPD / Pneumonia / Cellulitis / Lower Extremity Wound)  Once,   R       Question:  Patient immune status  Answer:  Normal   03/16/23 2151   03/16/23 1936  T3  Add-on,   AD        03/16/23 1935   03/16/23 1936  Osmolality, urine  Once,   URGENT        03/16/23 1935   03/16/23 1615  D-dimer, quantitative  Add-on,   AD        03/16/23 1614   03/16/23 1439  Blood gas, venous  Once,   R        03/16/23 1438            Vitals/Pain Today's Vitals   03/17/23 0101 03/17/23 0245 03/17/23 0300 03/17/23 0516  BP:  (!) 118/51 (!) 126/55   Pulse:  95 96   Resp:  15 17   Temp:  (!) 97.4 F (36.3 C)    TempSrc:  Axillary    SpO2:  95% 93%   Weight:      Height:      PainSc: 0-No pain   0-No pain    Isolation Precautions Droplet precaution  Medications Medications  allopurinol (ZYLOPRIM)  tablet 100 mg (100 mg Oral Given 03/16/23 2208)  atorvastatin (LIPITOR) tablet 80 mg (80 mg Oral Given 03/16/23 2208)  bisacodyl (DULCOLAX) EC tablet 10 mg (has no administration in time range)  carvedilol (COREG) tablet 6.25 mg (has no administration in time range)  clopidogrel (PLAVIX) tablet 75 mg (75 mg Oral Given 03/16/23 2208)  docusate sodium (COLACE) capsule 100 mg (has no administration in time range)  leflunomide (ARAVA) tablet 20 mg (has no administration in time range)  levothyroxine (SYNTHROID) tablet 88 mcg (has no administration in time range)  pantoprazole (PROTONIX) EC tablet 40 mg (40 mg Oral Given 03/16/23 2208)  sertraline (ZOLOFT) tablet 25 mg (25 mg Oral Given 03/16/23 2207)  acetaminophen (TYLENOL) tablet 650 mg (has no administration in time range)    Or  acetaminophen (TYLENOL) suppository 650 mg (has no administration in time range)  HYDROcodone-acetaminophen (NORCO/VICODIN) 5-325 MG per tablet 1-2 tablet (has no administration in time range)  ondansetron (ZOFRAN) tablet 4 mg (has no administration in time range)    Or  ondansetron (ZOFRAN) injection 4 mg (has no administration in time range)  albuterol (PROVENTIL) (2.5 MG/3ML) 0.083% nebulizer solution 2.5 mg (has no administration in time range)  methylPREDNISolone sodium succinate (SOLU-MEDROL) 40 mg/mL injection 40 mg (40 mg Intravenous Given 03/17/23 0515)    Followed by  predniSONE (DELTASONE) tablet 40 mg (has no administration in time range)  ipratropium-albuterol (DUONEB) 0.5-2.5 (3) MG/3ML nebulizer solution 3 mL (3 mLs Nebulization Given 03/17/23 0141)  albuterol (PROVENTIL) (2.5  MG/3ML) 0.083% nebulizer solution 2.5 mg (has no administration in time range)  cefTRIAXone (ROCEPHIN) 1 g in sodium chloride 0.9 % 100 mL IVPB (0 g Intravenous Stopped 03/16/23 2233)  ipratropium-albuterol (DUONEB) 0.5-2.5 (3) MG/3ML nebulizer solution 9 mL (9 mLs Nebulization Given 03/16/23 1453)  methylPREDNISolone sodium  succinate (SOLU-MEDROL) 125 mg/2 mL injection 125 mg (125 mg Intravenous Given 03/16/23 1448)  magnesium sulfate IVPB 2 g 50 mL (0 g Intravenous Stopped 03/17/23 0241)    Mobility walks with device     Focused Assessments Pulmonary Assessment Handoff:  Lung sounds: Bilateral Breath Sounds: Clear, Diminished L Breath Sounds: Rhonchi R Breath Sounds: Rhonchi O2 Device: Nasal Cannula O2 Flow Rate (L/min): 2 L/min    R Recommendations: See Admitting Provider Note  Report given to:   Additional Notes: 2L [place], prolapsed rectum, 2+ pitting edema BLE

## 2023-03-18 ENCOUNTER — Encounter: Payer: Self-pay | Admitting: Internal Medicine

## 2023-03-18 DIAGNOSIS — Z7189 Other specified counseling: Secondary | ICD-10-CM | POA: Diagnosis not present

## 2023-03-18 DIAGNOSIS — N179 Acute kidney failure, unspecified: Secondary | ICD-10-CM | POA: Diagnosis not present

## 2023-03-18 DIAGNOSIS — Z515 Encounter for palliative care: Secondary | ICD-10-CM | POA: Diagnosis not present

## 2023-03-18 DIAGNOSIS — J441 Chronic obstructive pulmonary disease with (acute) exacerbation: Secondary | ICD-10-CM | POA: Diagnosis not present

## 2023-03-18 MED ORDER — BIOTENE DRY MOUTH MT LIQD: 15.0000 mL | OROMUCOSAL | Status: DC | PRN

## 2023-03-18 MED ORDER — GLYCOPYRROLATE 0.2 MG/ML IJ SOLN: 0.2000 mg | INTRAMUSCULAR | Status: DC | PRN

## 2023-03-18 MED ORDER — HYDROXYZINE HCL 10 MG PO TABS
10.0000 mg | ORAL_TABLET | Freq: Once | ORAL | Status: AC
  Administered 2023-03-18: 10 mg via ORAL
  Filled 2023-03-18: qty 1

## 2023-03-18 MED ORDER — DIPHENHYDRAMINE HCL 50 MG/ML IJ SOLN: 12.5000 mg | INTRAMUSCULAR | Status: DC | PRN

## 2023-03-18 MED ORDER — HYDROMORPHONE HCL 1 MG/ML PO LIQD: 1.0000 mg | ORAL | Status: DC | PRN

## 2023-03-18 MED ORDER — HYDROMORPHONE HCL 1 MG/ML IJ SOLN: 0.2500 mg | INTRAMUSCULAR | Status: DC | PRN

## 2023-03-18 MED ORDER — LORAZEPAM 2 MG/ML IJ SOLN
1.0000 mg | INTRAMUSCULAR | Status: DC | PRN
  Administered 2023-03-19: 1 mg via INTRAVENOUS
  Filled 2023-03-18: qty 1

## 2023-03-18 MED ORDER — MORPHINE SULFATE (CONCENTRATE) 10 MG /0.5 ML PO SOLN
5.0000 mg | ORAL | Status: DC | PRN
Start: 2023-03-18 — End: 2023-03-18

## 2023-03-18 MED ORDER — GLYCOPYRROLATE 1 MG PO TABS: 1.0000 mg | ORAL_TABLET | ORAL | Status: DC | PRN

## 2023-03-18 MED ORDER — POLYVINYL ALCOHOL 1.4 % OP SOLN: 1.0000 [drp] | Freq: Four times a day (QID) | OPHTHALMIC | Status: DC | PRN

## 2023-03-18 MED ORDER — LORAZEPAM 1 MG PO TABS: 1.0000 mg | ORAL_TABLET | ORAL | Status: DC | PRN

## 2023-03-18 MED ORDER — LORAZEPAM 2 MG/ML PO CONC: 1.0000 mg | ORAL | Status: DC | PRN

## 2023-03-18 MED ORDER — HYDROMORPHONE HCL 1 MG/ML IJ SOLN
0.2500 mg | INTRAMUSCULAR | Status: DC | PRN
  Administered 2023-03-18 – 2023-03-19 (×4): 0.25 mg via INTRAVENOUS
  Filled 2023-03-18 (×4): qty 0.5

## 2023-03-18 NOTE — Plan of Care (Signed)
  Problem: Clinical Measurements: Goal: Ability to maintain clinical measurements within normal limits will improve Outcome: Progressing   Problem: Nutrition: Goal: Adequate nutrition will be maintained Outcome: Progressing   Problem: Pain Management: Goal: General experience of comfort will improve Outcome: Progressing   Problem: Respiratory: Goal: Ability to maintain a clear airway will improve Outcome: Progressing

## 2023-03-18 NOTE — Plan of Care (Signed)

## 2023-03-18 NOTE — Progress Notes (Signed)
PROGRESS NOTE    [name]  ZOX:[id number] DOB: [birth year] DOA: 03/16/2023 PCP: [name], MD   Brief Narrative:   82 year old female with history of CAD status post CABG, DVT/PE, CKD stage IV (does not want to be started on HD), COPD, arthritis, hypertension, hyperlipidemia, abdominal aortic aneurysm status postrepair in 1996, asplenia, hypothyroidism, polymyalgia rheumatica, CVA, gout presented with worsening shortness of breath and right knee pain along with lethargy.  On presentation, patient was hypoxic requiring supplemental oxygen.  Creatinine was 7.82.  Nephrology and palliative care were consulted.  She was started on IV antibiotics and Solu-Medrol.  Assessment & Plan:   AKI on CKD stage IV, now progressed to ESRD as per nephrology evaluation Hyperphosphatemia Metabolic acidosis -Aggressive renal failure, creatinine peaked at 8.29.   -Patient and family do not want any HD, and they want to proceed to comfort measure, please see discussion below  Acute respiratory failure with hypoxia COPD exacerbation -Remains on 2 L oxygen by nasal cannula.  Respiratory virus panel and COVID-19 PCR negative.  Chest x-ray showed no acute airspace disease but showed streaky left basilar consolidation likely atelectasis or scarring. -Continue supplemental oxygen.  Continue current nebs.  Continue with steroids -DC Rocephin  Acute metabolic encephalopathy -Possibly from above.  CT head negative for any acute intracranial process.  Monitor mental status.  Fall precautions.  Delirium precautions. -She is awake and conversant currently  Chronic knee pain -Presented with worsening right knee pain.  Right knee x-ray shows large suprapatellar effusion but no acute fracture or dislocation. -Continue pain management  Anemia of chronic kidney disease -From chronic kidney disease.  Hemoglobin stable.  Monitor intermittently  Hyponatremia Hyperphosphatemia Hypomagnesemia -She is  currently comfort measures, no further labs  Hypothyroidism, acquired -Continue levothyroxine  Hyperlipidemia  History of gout  History of CAD status post CABG   Goals of care discussion End-of-life care -Palliative input greatly appreciated, as well I have discussed with family including son, daughter, and husband at bedside, as well patient who was awake, alert and appropriate, they understand in the setting of her current ESRD, we are approaching end-of-life, and they want to focus mainly on comfort, and elevating her dyspnea symptoms, palliative assistance greatly appreciated, she is currently full comfort measures, on as needed Dilaudid and Ativan, pending clinical course termination will be made about disposition.    Code Status: DNR-comfort Family Communication: Son, daughter and son at bedside Disposition Plan: Status is: Inpatient Remains inpatient appropriate because: Of severity of illness  Consultants: Nephrology/palliative care  Procedures: None  Antimicrobials: Rocephin from 03/16/2023 onwards   Subjective: Patient reports some dyspnea  Objective: Vitals:   03/18/23 0009 03/18/23 0342 03/18/23 0700 03/18/23 1100  BP: (!) 126/51 128/61 (!) 146/61 (!) 125/53  Pulse: 86 86 97 84  Resp: 15 16 17  (!) 33  Temp: 98 F (36.7 C) (!) 97.5 F (36.4 C) (!) 97.3 F (36.3 C) (!) 97.3 F (36.3 C)  TempSrc: Oral Oral Oral Oral  SpO2: 94% 92% 92% 92%  Weight:      Height:       No intake or output data in the 24 hours ending 03/18/23 1255  Filed Weights   03/16/23 1439  Weight: 58.9 kg    Examination:  Awake Alert, Oriented X 3, frail, deconditioned, chronically ill-appearing, she is pleasant, on 2 L oxygen Symmetrical Chest wall movement, DEcreased air entry bilaterally, mildly tachypneic, but she appears comfortable overall.  RRR,No Gallops,Rubs or new Murmurs, No Parasternal Heave +  ve B.Sounds, Abd Soft, No tenderness, No rebound - guarding or  rigidity. No Cyanosis, Clubbing, trace edema    Data Reviewed: I have personally reviewed following labs and imaging studies  CBC: Recent Labs  Lab 03/12/23 1118 03/16/23 1420 03/17/23 0323  WBC  --  10.3 9.3  NEUTROABS  --  7.7  --   HGB 8.6* 7.8* 8.2*  HCT  --  23.6* 24.7*  MCV  --  95.9 97.2  PLT  --  242 281   Basic Metabolic Panel: Recent Labs  Lab 03/16/23 1420 03/16/23 2205 03/17/23 0323  NA 132*  --  136  K 3.5  --  3.9  CL 102  --  103  CO2 12*  --  9*  GLUCOSE 113*  --  147*  BUN 136*  --  144*  CREATININE 7.82*  --  8.29*  CALCIUM 7.4*  --  7.4*  MG  --  1.0* 1.6*  PHOS  --  10.0* 10.6*   GFR: Estimated Creatinine Clearance: 4.2 mL/min (A) (by C-G formula based on SCr of 8.29 mg/dL (H)). Liver Function Tests: Recent Labs  Lab 03/16/23 1420 03/17/23 0323  AST 21 21  ALT 16 19  ALKPHOS 69 74  BILITOT 0.9 0.7  PROT 4.7* 5.1*  ALBUMIN 2.2* 2.3*   No results for input(s): "LIPASE", "AMYLASE" in the last 168 hours. Recent Labs  Lab 03/16/23 2205  AMMONIA <10   Coagulation Profile: No results for input(s): "INR", "PROTIME" in the last 168 hours. Cardiac Enzymes: Recent Labs  Lab 03/16/23 2205  CKTOTAL 78   BNP (last 3 results) No results for input(s): "PROBNP" in the last 8760 hours. HbA1C: No results for input(s): "HGBA1C" in the last 72 hours. CBG: No results for input(s): "GLUCAP" in the last 168 hours. Lipid Profile: No results for input(s): "CHOL", "HDL", "LDLCALC", "TRIG", "CHOLHDL", "LDLDIRECT" in the last 72 hours. Thyroid Function Tests: Recent Labs    03/16/23 2205  TSH 1.270  FREET4 1.07   Anemia Panel: Recent Labs    03/16/23 2205 03/17/23 0323  VITAMINB12  --  6,237*  FOLATE  --  >40.0  FERRITIN 489*  --   TIBC 147*  --   IRON 19*  --   RETICCTPCT 3.1  --    Sepsis Labs: Recent Labs  Lab 03/16/23 2205 03/16/23 2320  PROCALCITON 0.52  --   LATICACIDVEN 0.8 0.6    Recent Results (from the past 240  hour(s))  SARS Coronavirus 2 by RT PCR (hospital order, performed in [facility] hospital lab) *cepheid single result test* Anterior Nasal Swab     Status: None   Collection Time: 03/16/23  3:25 PM   Specimen: Anterior Nasal Swab  Result Value Ref Range Status   SARS Coronavirus 2 by RT PCR NEGATIVE NEGATIVE Final    Comment: Performed at [facility], [address], [place], [place] [address]  Respiratory (~20 pathogens) panel by PCR     Status: None   Collection Time: 03/16/23  7:36 PM   Specimen: Nasopharyngeal Swab; Respiratory  Result Value Ref Range Status   Adenovirus NOT DETECTED NOT DETECTED Final   Coronavirus 229E NOT DETECTED NOT DETECTED Final    Comment: (NOTE) The Coronavirus on the Respiratory Panel, DOES NOT test for the novel  Coronavirus (2019 nCoV)    Coronavirus HKU1 NOT DETECTED NOT DETECTED Final   Coronavirus NL63 NOT DETECTED NOT DETECTED Final   Coronavirus OC43 NOT DETECTED NOT DETECTED  Final   Metapneumovirus NOT DETECTED NOT DETECTED Final   Rhinovirus / Enterovirus NOT DETECTED NOT DETECTED Final   Influenza A NOT DETECTED NOT DETECTED Final   Influenza B NOT DETECTED NOT DETECTED Final   Parainfluenza Virus 1 NOT DETECTED NOT DETECTED Final   Parainfluenza Virus 2 NOT DETECTED NOT DETECTED Final   Parainfluenza Virus 3 NOT DETECTED NOT DETECTED Final   Parainfluenza Virus 4 NOT DETECTED NOT DETECTED Final   Respiratory Syncytial Virus NOT DETECTED NOT DETECTED Final   Bordetella pertussis NOT DETECTED NOT DETECTED Final   Bordetella Parapertussis NOT DETECTED NOT DETECTED Final   Chlamydophila pneumoniae NOT DETECTED NOT DETECTED Final   Mycoplasma pneumoniae NOT DETECTED NOT DETECTED Final    Comment: Performed at [facility], [address], [place], [place] [address]  MRSA Next Gen by PCR, Nasal     Status: None   Collection Time: 03/17/23  3:23 AM   Specimen: Nasal Mucosa; Nasal Swab  Result Value Ref Range Status   MRSA by PCR  Next Gen NOT DETECTED NOT DETECTED Final    Comment: (NOTE) The GeneXpert MRSA Assay (FDA approved for NASAL specimens only), is one component of a comprehensive MRSA colonization surveillance program. It is not intended to diagnose MRSA infection nor to guide or monitor treatment for MRSA infections. Test performance is not FDA approved in patients less than 21 years old. Performed at [facility], [address], [place], [place] [address]          Radiology Studies: VAS [place] LOWER EXTREMITY VENOUS (DVT)  Result Date: 03/17/2023  Lower Venous DVT Study Patient Name:  KALLE WIEBOLD  Date of Exam:   03/17/2023 Medical Rec #: [id number]       Accession #:    [id number] Date of Birth: [birth year]        Patient Gender: F Patient Age:   31 years Exam Location:  [facility] Procedure:      VAS [place] LOWER EXTREMITY VENOUS (DVT) Referring Phys: Jonny Ruiz DOUTOVA --------------------------------------------------------------------------------  Indications: Edema, SOB, and Right knee pain.  Limitations: Edema, pain with compression, involuntary patient movement. Comparison Study: Prior negative bilateral LEV done 12/09/22 Performing Technologist: Sherren Kerns RVS  Examination Guidelines: A complete evaluation includes B-mode imaging, spectral Doppler, color Doppler, and power Doppler as needed of all accessible portions of each vessel. Bilateral testing is considered an integral part of a complete examination. Limited examinations for reoccurring indications may be performed as noted. The reflux portion of the exam is performed with the patient in reverse Trendelenburg.  +---------+---------------+---------+-----------+---------------+--------------+ RIGHT    CompressibilityPhasicitySpontaneityProperties     Thrombus Aging +---------+---------------+---------+-----------+---------------+--------------+ CFV      Full           Yes      No         pulsatile                                                                  waveforms                     +---------+---------------+---------+-----------+---------------+--------------+ SFJ      Full                                                             +---------+---------------+---------+-----------+---------------+--------------+  FV Prox  Full           Yes      Yes        pulsatile                                                                 waveforms                     +---------+---------------+---------+-----------+---------------+--------------+ FV Mid                  No       Yes                       patent by                                                                 color and                                                                 Doppler        +---------+---------------+---------+-----------+---------------+--------------+ FV Distal               Yes      Yes        pulsatile      patent by                                                  waveforms      color and                                                                 Doppler        +---------+---------------+---------+-----------+---------------+--------------+ PFV      Full           Yes      Yes                                      +---------+---------------+---------+-----------+---------------+--------------+ POP      Full           Yes      Yes        pulsatile      patent by  waveforms      color and                                                                 Doppler        +---------+---------------+---------+-----------+---------------+--------------+ PTV      Full                                                             +---------+---------------+---------+-----------+---------------+--------------+ PERO                                                       Not well                                                                   visualized     +---------+---------------+---------+-----------+---------------+--------------+ Gastroc  Full                                                             +---------+---------------+---------+-----------+---------------+--------------+ Fluid noted medial knee, etiology unknown  +---------+---------------+---------+-----------+---------------+--------------+ LEFT     CompressibilityPhasicitySpontaneityProperties     Thrombus Aging +---------+---------------+---------+-----------+---------------+--------------+ CFV      Full           Yes      Yes        pulsatile                                                                 waveforms                     +---------+---------------+---------+-----------+---------------+--------------+ SFJ      Full                                                             +---------+---------------+---------+-----------+---------------+--------------+ FV Prox                 Yes      No         pulsatile      patent by  waveforms      color and                                                                 Doppler        +---------+---------------+---------+-----------+---------------+--------------+ FV Mid                  Yes      Yes        pulsatile      patent by                                                  waveforms      color and                                                                 Doppler        +---------+---------------+---------+-----------+---------------+--------------+ FV Distal               Yes      No         pulsatile      patent by                                                  waveforms      color and                                                                 Doppler         +---------+---------------+---------+-----------+---------------+--------------+ PFV                     Yes      No         pulsatile      patent by                                                  waveforms      color and                                                                 Doppler        +---------+---------------+---------+-----------+---------------+--------------+ POP  Full           Yes      Yes        pulsatile                                                                 waveforms                     +---------+---------------+---------+-----------+---------------+--------------+ PTV      Full                                                             +---------+---------------+---------+-----------+---------------+--------------+ PERO     Full                                                             +---------+---------------+---------+-----------+---------------+--------------+ Gastroc  Full                                                             +---------+---------------+---------+-----------+---------------+--------------+     Summary: RIGHT: - Edema noted medial knee, etiology unknown Interstitial fluid noted throughout right lower extremity. Pulsatile waveforms noted.  LEFT: - A cystic structure is found in the popliteal fossa. Interstitial edema noted throughout left lower extremity. Pulsatile waveforms noted.  *See table(s) above for measurements and observations. Electronically signed by Lemar Livings MD on 03/17/2023 at 1:31:50 PM.    Final    ECHOCARDIOGRAM COMPLETE  Result Date: 03/17/2023    ECHOCARDIOGRAM REPORT   Patient Name:   CHEMEKA YARBOUGH Date of Exam: 03/17/2023 Medical Rec #:  [id number]      Height:       63.0 in Accession #:    [id number]     Weight:       129.9 lb Date of Birth:  1940/11/09       BSA:          1.609 m Patient Age:    82 years       BP:           133/91 mmHg Patient Gender: F               HR:           90 bpm. Exam Location:  Inpatient Procedure: 2D Echo, Cardiac Doppler and Color Doppler Indications:    Elevated Troponin  History:        Patient has prior history of Echocardiogram examinations, most                 recent 02/07/2022. CAD, TIA, COPD and CKD, stage 4; Risk  Factors:Hypertension and Sleep Apnea.  Sonographer:    Lucendia Herrlich RCS Referring Phys: [id number] ANASTASSIA DOUTOVA IMPRESSIONS  1. Left ventricular ejection fraction, by estimation, is 65 to 70%. The left ventricle has normal function. The left ventricle has no regional wall motion abnormalities. There is mild left ventricular hypertrophy. Left ventricular diastolic parameters are indeterminate.  2. Right ventricular systolic function is normal. The right ventricular size is normal. There is normal pulmonary artery systolic pressure. The estimated right ventricular systolic pressure is 28.8 mmHg.  3. Left atrial size was mild to moderately dilated.  4. The mitral valve is grossly normal. Mild mitral valve regurgitation. No evidence of mitral stenosis.  5. The aortic valve is tricuspid. Aortic valve regurgitation is not visualized. No aortic stenosis is present.  6. The inferior vena cava is dilated in size with >50% respiratory variability, suggesting right atrial pressure of 8 mmHg. FINDINGS  Left Ventricle: Left ventricular ejection fraction, by estimation, is 65 to 70%. The left ventricle has normal function. The left ventricle has no regional wall motion abnormalities. The left ventricular internal cavity size was normal in size. There is  mild left ventricular hypertrophy. Left ventricular diastolic parameters are indeterminate. Right Ventricle: The right ventricular size is normal. No increase in right ventricular wall thickness. Right ventricular systolic function is normal. There is normal pulmonary artery systolic pressure. The tricuspid regurgitant velocity is 2.28 m/s, and  with an assumed right  atrial pressure of 8 mmHg, the estimated right ventricular systolic pressure is 28.8 mmHg. Left Atrium: Left atrial size was mild to moderately dilated. Right Atrium: Right atrial size was normal in size. Pericardium: There is no evidence of pericardial effusion. Mitral Valve: The mitral valve is grossly normal. Mild mitral valve regurgitation. No evidence of mitral valve stenosis. Tricuspid Valve: The tricuspid valve is normal in structure. Tricuspid valve regurgitation is mild . No evidence of tricuspid stenosis. Aortic Valve: The aortic valve is tricuspid. Aortic valve regurgitation is not visualized. No aortic stenosis is present. Aortic valve peak gradient measures 10.4 mmHg. Pulmonic Valve: The pulmonic valve was normal in structure. Pulmonic valve regurgitation is mild. No evidence of pulmonic stenosis. Aorta: The aortic root is normal in size and structure. Venous: The inferior vena cava is dilated in size with greater than 50% respiratory variability, suggesting right atrial pressure of 8 mmHg. IAS/Shunts: No atrial level shunt detected by color flow Doppler.  LEFT VENTRICLE PLAX 2D LVIDd:         4.30 cm   Diastology LVIDs:         2.60 cm   LV e' medial:    6.31 cm/s LV PW:         1.00 cm   LV E/e' medial:  20.0 LV IVS:        1.20 cm   LV e' lateral:   10.20 cm/s LVOT diam:     2.30 cm   LV E/e' lateral: 12.4 LV SV:         83 LV SV Index:   51 LVOT Area:     4.15 cm  RIGHT VENTRICLE             IVC RV S prime:     16.80 cm/s  IVC diam: 2.70 cm TAPSE (M-mode): 1.8 cm LEFT ATRIUM             Index        RIGHT ATRIUM           Index LA  diam:        3.60 cm 2.24 cm/m   RA Area:     15.20 cm LA Vol (A2C):   58.2 ml 36.16 ml/m  RA Volume:   39.10 ml  24.30 ml/m LA Vol (A4C):   55.2 ml 34.30 ml/m LA Biplane Vol: 60.5 ml 37.59 ml/m  AORTIC VALVE                 PULMONIC VALVE AV Area (Vmax): 2.79 cm     PR End Diast Vel: 4.24 msec AV Vmax:        161.00 cm/s AV Peak Grad:   10.4 mmHg LVOT Vmax:       108.00 cm/s LVOT Vmean:     69.400 cm/s LVOT VTI:       0.199 m  AORTA Ao Root diam: 3.00 cm Ao Asc diam:  3.20 cm MITRAL VALVE                TRICUSPID VALVE MV Area (PHT): 5.02 cm     TR Peak grad:   20.8 mmHg MV Decel Time: 151 msec     TR Vmax:        228.00 cm/s MR Peak grad: 98.0 mmHg MR Vmax:      495.00 cm/s   SHUNTS MV E velocity: 126.00 cm/s  Systemic VTI:  0.20 m MV A velocity: 86.20 cm/s   Systemic Diam: 2.30 cm MV E/A ratio:  1.46 Weston Brass MD Electronically signed by Weston Brass MD Signature Date/Time: 03/17/2023/1:14:22 PM    Final    CT HEAD WO CONTRAST ( )  Result Date: 03/16/2023 CLINICAL DATA:  Delirium EXAM: CT HEAD WITHOUT CONTRAST TECHNIQUE: Contiguous axial images were obtained from the base of the skull through the vertex without intravenous contrast. RADIATION DOSE REDUCTION: This exam was performed according to the departmental dose-optimization program which includes automated exposure control, adjustment of the mA and/or kV according to patient size and/or use of iterative reconstruction technique. COMPARISON:  02/15/2023 FINDINGS: Brain: No acute infarct or hemorrhage. Lateral ventricles and midline structures are unremarkable. No acute extra-axial fluid collections. No mass effect. Stable diffuse cerebral atrophy. Vascular: Stable atherosclerosis.  No hyperdense vessel. Skull: Normal. Negative for fracture or focal lesion. Sinuses/Orbits: No acute finding. Other: None. IMPRESSION: 1. Stable head CT, no acute intracranial process. Electronically Signed   By: Sharlet Salina M.D.   On: 03/16/2023 22:17   DG Knee 1-2 Views Right  Result Date: 03/16/2023 CLINICAL DATA:  Right knee pain. EXAM: RIGHT KNEE - 1-2 VIEW COMPARISON:  Right knee radiograph dated 02/18/2023. FINDINGS: There is no acute fracture or dislocation. The bones are osteopenic. There is degenerative changes with moderate narrowing of the lateral compartment. There is a large suprapatellar effusion. The  soft tissues are unremarkable. IMPRESSION: 1. No acute fracture or dislocation. 2. Large suprapatellar effusion. Electronically Signed   By: Elgie Collard M.D.   On: 03/16/2023 21:11   DG Chest Portable 1 View  Result Date: 03/16/2023 CLINICAL DATA:  Short of breath EXAM: PORTABLE CHEST 1 VIEW COMPARISON:  02/15/2023 FINDINGS: Single frontal view of the chest demonstrates stable postsurgical changes from CABG. The cardiac silhouette is unremarkable. Interval development of trace bilateral pleural effusions. Streaky opacities at the left lung base could reflect atelectasis or scarring. No acute airspace disease or pneumothorax. No acute bony abnormality. IMPRESSION: 1. Trace bilateral pleural effusions. 2. Streaky left basilar consolidation likely atelectasis or scarring. 3. No acute airspace disease. Electronically Signed  By: Sharlet Salina M.D.   On: 03/16/2023 17:46        Scheduled Meds:  leflunomide  20 mg Oral Daily   predniSONE  40 mg Oral Q breakfast   sertraline  25 mg Oral Daily   Continuous Infusions:          Huey Bienenstock, MD [facility] 03/18/2023, 12:55 PM

## 2023-03-18 NOTE — Progress Notes (Signed)
Daily Progress Note   Patient Name: [name]       Date: 03/18/2023 DOB: June 22, 1940  Age: 82 y.o. MRN#: [id number] Attending Physician: Starleen Arms, MD Primary Care Physician: [name], MD Admit Date: 03/16/2023  Reason for Consultation/Follow-up: Establishing goals of care  Subjective: Medical records reviewed including progress notes, labs and imaging. Received call on PMT phone line with message from patient's son requesting to further discuss potential hospice discharge tomorrow.  Patient assessed at the bedside. She is tachypneic, reports a new cough. Discussed with RN. Her husband and son are present visiting and we are also joined by another visitor.   Reviewed the hospice referral process and options including return to [facility] with hospice or residential hospice placement. Family does not feel that cost would be an inhibiting factor at [facility], although they are concerned she may not get the timely response from staff that she needs as she approaches end of life. We discussed the importance of weighing these concerns with the benefits, including proximity to patient's husband. Confirmed that this could be explored further tomorrow between [facility] and [facility] and if necessary, residential hospice could be pursued instead.  Counseled on the option of transition to comfort-focused care during hospitalization given patient's symptom burden. Discussed that patient would no longer receive aggressive medical interventions such as continuous vital signs, lab work, radiology testing, or medications not focused on comfort. All care would focus on how the patient is looking and feeling. This would include management of any symptoms that may cause discomfort, pain,  shortness of breath, cough, nausea, agitation, anxiety, and/or secretions etc. Symptoms would be managed with medications and other non-pharmacological interventions such as spiritual support if requested, repositioning, music therapy, or therapeutic listening. Family verbalized understanding and appreciation. They would like to proceed with transition today. Patient encouraged to request any medications for symptom management and educated on the process of titration to effectiveness.     Questions and concerns addressed. PMT will continue to support holistically.   Length of Stay: 2   Physical Exam Vitals and nursing note reviewed.  Constitutional:      General: She is not in acute distress.    Appearance: She is ill-appearing.     Interventions: Nasal cannula in place.     Comments: 2L  Cardiovascular:     Rate and Rhythm: Normal rate.  Pulmonary:     Effort: Tachypnea present.  Skin:    General: Skin is warm and dry.  Neurological:     Mental Status: She is alert. Mental status is at baseline.  Psychiatric:        Mood and Affect: Mood normal.        Behavior: Behavior normal.             Vital Signs: BP (!) 146/61 (BP Location: Left Arm)   Pulse 97   Temp (!) 97.3 F (36.3 C) (Oral)   Resp 17   Ht 5\' 3"  (1.6 m)   Wt 58.9 kg   SpO2 92%   BMI 23.00 kg/m  SpO2: SpO2: 92 % O2 Device: O2 Device: Nasal Cannula O2 Flow Rate: O2 Flow Rate (L/min): 2 L/min  Palliative Assessment/Data:   Palliative Care Assessment & Plan   Patient Profile: 82 y.o. 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    Patient has had 3 admissions in the past 6 months, as well as 2 ED visits. She was noted to be more lethargic at SNF. Currently admitted for AKI on CKD IV , COPD exacerbation, and acute  metabolic encephalopathy likely due to uremia. Patient does not want HD.    PMT has been consulted to assist with goals of care conversation.  Assessment: Goals of care conversation AKI on CKD stage IV, now progressed to ESRD  Acute respiratory failure with hypoxia  Acute metabolic acidosis   Recommendations/Plan: Continue DNR/DNI Transition to full comfort care today Dilaudid PO or IV PRN for pain/air hunger/comfort Robinul PRN for excessive secretions Ativan PRN for agitation/anxiety Zofran PRN for nausea Liquifilm tears PRN for dry eyes May have comfort feeding Comfort cart for family Unrestricted visitations in the setting of EOL (per policy) Oxygen PRN 2L or less for comfort. No escalation.   Family considering whether to discharge to [facility] skilled nursing with hospice vs residential hospice. TOC previously consulted for assistance, likely Monday 11/18 at the earliest given need to transition patient from ILF Psychosocial and emotional support provided PMT will continue to follow and support   Prognosis:  Weeks  Discharge Planning: SNF with hospice vs residential hospice pending further conversation with patient's facility  Care plan was discussed with patient, patient's family, RN, MD    MDM high         [name] Jeni Salles, PA-C  Palliative Medicine Team Team phone # [phone]  Thank you for allowing the Palliative Medicine Team to assist in the care of this patient. Please utilize secure chat with additional questions, if there is no response within 30 minutes please call the above phone number.  Palliative Medicine Team providers are available by phone from 7am to 7pm daily and can be reached through the team cell phone.  Should this patient require assistance outside of these hours, please call the patient's attending physician.

## 2023-03-19 DIAGNOSIS — Z7189 Other specified counseling: Secondary | ICD-10-CM | POA: Diagnosis not present

## 2023-03-19 DIAGNOSIS — J441 Chronic obstructive pulmonary disease with (acute) exacerbation: Secondary | ICD-10-CM | POA: Diagnosis not present

## 2023-03-19 DIAGNOSIS — Z515 Encounter for palliative care: Secondary | ICD-10-CM

## 2023-03-19 DIAGNOSIS — R0602 Shortness of breath: Secondary | ICD-10-CM

## 2023-03-19 DIAGNOSIS — Z789 Other specified health status: Secondary | ICD-10-CM

## 2023-03-19 DIAGNOSIS — Z66 Do not resuscitate: Secondary | ICD-10-CM

## 2023-03-19 LAB — T3: T3, Total: 41 ng/dL — ABNORMAL LOW (ref 71–180)

## 2023-03-19 MED ORDER — HYDROMORPHONE HCL 1 MG/ML IJ SOLN: 0.2500 mg | INTRAMUSCULAR | Status: DC | PRN

## 2023-03-19 MED ORDER — HYDROMORPHONE HCL 1 MG/ML PO LIQD: 1.0000 mg | ORAL | Status: DC | PRN

## 2023-03-19 NOTE — TOC Initial Note (Addendum)
Transition of Care [facility]) - Initial/Assessment Note    Patient Details  Name: [name] MRN: [id number] Date of Birth: August 06, 1940  [facility]) CM/SW Contact:    Janae Bridgeman, RN Phone Number: 03/19/2023, 10:22 AM  Clinical Narrative:                 CM met with the patient and family at the bedside to discuss and provide choice regarding patient's transitions in care needs.  The patient was living with her husband, Remii Adriano at [facility] and would patient would like inpatient placement at [facility].  Medicare choice regarding Hospice facility was provided and the patient/family prefers [facility].  I sent a referral to [facility] for inpatient Hospice review and bed availability.  Palliative Care NP, Amber, is present at the bedside this morning.  03/19/2023 - Consents were signed by the family for admission to [facility] facility.  PTAR was scheduled for 4:30 pm.  PTAR transport packet is available at the secretary's desk and family are aware of scheduled time.  Bedside nursing will call report to [facility] prior to transport to the facility - number to call - [phone].        Patient Goals and CMS Choice            Expected Discharge Plan and Services                                              Prior Living Arrangements/Services                       Activities of Daily Living   ADL Screening (condition at time of admission) Independently performs ADLs?: No Does the patient have a NEW difficulty with bathing/dressing/toileting/self-feeding that is expected to last >3 days?: Yes (Initiates electronic notice to provider for possible OT consult) Does the patient have a NEW difficulty with getting in/out of bed, walking, or climbing stairs that is expected to last >3 days?: Yes (Initiates electronic notice to provider for possible PT consult) Does the patient have a NEW difficulty  with communication that is expected to last >3 days?: No Is the patient deaf or have difficulty hearing?: No Does the patient have difficulty seeing, even when wearing glasses/contacts?: Yes Does the patient have difficulty concentrating, remembering, or making decisions?: No  Permission Sought/Granted                  Emotional Assessment              Admission diagnosis:  SOB (shortness of breath) [R06.02] COPD exacerbation (HCC) [J44.1] Patient Active Problem List   Diagnosis Date Noted   COPD exacerbation (HCC) 03/16/2023   Leg edema 03/16/2023   Knee pain 03/16/2023   Rectal prolapse 03/13/2023   Urogenital candidiasis 03/13/2023   Mild cognitive impairment 03/06/2023   Pneumonia of right lower lobe due to infectious organism 03/05/2023   Leukocytosis 03/05/2023   Post herpetic neuralgia 02/27/2023   Hypertensive urgency 02/15/2023   Zoster 02/08/2023   Headache 02/08/2023   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   Acute otitis externa of left ear 01/26/2023   Tooth abscess 01/26/2023   COVID-19 12/25/2022   GI bleed 12/08/2022   Bleeding hemorrhoids 12/08/2022   Rectal bleeding 11/30/2022   Acute GI bleeding 11/27/2022  Chronic anticoagulation 11/27/2022   Lower GI bleed 11/27/2022   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   Anxiety 10/02/2022   Severe obstructive sleep apnea 08/24/2022   Nasal bones, closed fracture 08/07/2022   Edema 07/26/2022   Acute gastric ulcer with hemorrhage 07/08/2022   Duodenal ulcer 07/08/2022   Dark stools 07/07/2022   Anemia 07/07/2022   E coli bacteremia 07/04/2022   Acute pyelonephritis 07/03/2022   Acute metabolic encephalopathy 07/03/2022   Hyponatremia 07/03/2022   Sepsis with acute renal failure (HCC) 07/03/2022   DNR (do not resuscitate)/DNI(Do Not Intubate) 07/03/2022   Generalized weakness 05/23/2022   Gallstones 03/17/2022   Acute renal failure superimposed on stage 4 chronic kidney disease (HCC)  03/06/2022   Metabolic acidosis, increased anion gap 03/06/2022   Hip dislocation, left (HCC) 03/06/2022   Anemia due to chronic kidney disease 03/06/2022   History of DVT (deep vein thrombosis) 03/06/2022   Allergic rhinitis 03/06/2022   History of pulmonary embolism 02/06/2022   Vitamin D deficiency 01/30/2022   Slow transit constipation 01/30/2022   GERD (gastroesophageal reflux disease) 01/30/2022   Status post total replacement of left hip 01/23/2022   Melena    Upper GI bleed 11/17/2021   CKD (chronic kidney disease) stage 4, GFR 15-29 ml/min (HCC) - baseline SCr 2.5. follows with Dr. Malen Gauze with [facility] 11/17/2021   Primary osteoarthritis of left hip 10/06/2021   Aortic atherosclerosis (HCC) 09/07/2020   Neoplasm of uncertain behavior of skin 03/14/2020   Arthralgia 12/25/2019   Ankle swelling 08/08/2019   Osteoporosis 02/26/2018   Colon polyp 06/28/2016   COPD (chronic obstructive pulmonary disease) (HCC) 05/28/2016   Upper airway cough syndrome 05/22/2016   Acute respiratory failure with hypoxia (HCC) 04/15/2016   Wrist pain, right 12/14/2015   Lung mass 03/02/2015   Pre-operative cardiovascular examination 02/21/2015   DOE (dyspnea on exertion) 02/16/2015   TIA (transient ischemic attack) 12/31/2014   Abnormal mental state 12/30/2014   Well adult exam 11/26/2014   Seasonal allergies 12/31/2013   Splenic artery aneurysm (HCC) 04/07/2013   Cerumen impaction 03/20/2013   Hyperlipidemia with target LDL less than 70    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   Anticoagulated 11/08/2010   Rheumatoid arthritis (HCC) 06/23/2010   Pain in joint 11/26/2009   Acquired hypothyroidism 12/08/2006   Macular degeneration (senile) of retina 12/08/2006   Essential hypertension 12/08/2006   Diaphragmatic hernia 12/08/2006   ASPLENIA 12/08/2006   Coronary artery disease involving native coronary artery of native heart without angina pectoris 01/30/1995   Hx of CABG  01/01/1995   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 - [address] [place] [address] Phone: [phone] Fax: [phone]  [facility] - [place], [place] - [address] [place] [address] Phone: [phone] Fax: [phone]     Social Determinants of Health (SDOH) Social History: SDOH Screenings   Food Insecurity: No Food Insecurity (03/17/2023)  Housing: Patient Declined (03/17/2023)  Transportation Needs: No Transportation Needs (03/17/2023)  Utilities: Not At Risk (03/17/2023)  Alcohol Screen: Low Risk  (05/30/2021)  Depression (PHQ2-9): Medium Risk (03/13/2023)  Financial Resource Strain: Low Risk  (09/05/2022)  Physical Activity: Insufficiently Active (03/13/2023)  Social Connections: Moderately Integrated (09/05/2022)  Stress: Stress Concern Present (03/13/2023)  Tobacco Use: Medium Risk (03/17/2023)   SDOH Interventions:     Readmission Risk Interventions    03/19/2023   10:21 AM 07/05/2022    9:42 AM  Readmission Risk Prevention Plan  Transportation Screening Complete Complete  Medication Review Oceanographer) Complete Complete  PCP or Specialist appointment within 3-5 days of discharge Complete Complete  HRI or Home Care Consult Complete Complete  SW Recovery Care/Counseling Consult Complete Complete  Palliative Care Screening Complete Not Applicable  Skilled Nursing Facility Complete Complete

## 2023-03-19 NOTE — Progress Notes (Signed)
[facility] Liaison Note  Received request from [facility] manager for family interest in [facility]. Eligibility confirmed. Met with patient and family to confirm interest and explain services. Family agreeable to transfer today. TOC aware. RN please call report to [phone] prior to patient leaving the unit. Please send signed DNR with patient at discharge.   Thank you for allowing [place] to participate in this patient's care.  Henderson Newcomer, LPN [facility] Liaison [phone]

## 2023-03-19 NOTE — Care Management Important Message (Signed)
Important Message  Patient Details  Name: [name] MRN: [id number] Date of Birth: [birth year]   Important Message Given:  Yes - Medicare IM     [name] 03/19/2023, 3:13 PM

## 2023-03-19 NOTE — Plan of Care (Signed)
  Problem: Education: Goal: Knowledge of General Education information will improve Description: Including pain rating scale, medication(s)/side effects and non-pharmacologic comfort measures Outcome: Progressing   Problem: Health Behavior/Discharge Planning: Goal: Ability to manage health-related needs will improve Outcome: Progressing   Problem: Clinical Measurements: Goal: Ability to maintain clinical measurements within normal limits will improve Outcome: Progressing Goal: Will remain free from infection Outcome: Progressing Goal: Diagnostic test results will improve Outcome: Progressing Goal: Respiratory complications will improve Outcome: Progressing Goal: Cardiovascular complication will be avoided Outcome: Progressing   Problem: Activity: Goal: Risk for activity intolerance will decrease Outcome: Progressing   Problem: Nutrition: Goal: Adequate nutrition will be maintained Outcome: Progressing   Problem: Coping: Goal: Level of anxiety will decrease Outcome: Progressing   Problem: Elimination: Goal: Will not experience complications related to bowel motility Outcome: Progressing Goal: Will not experience complications related to urinary retention Outcome: Progressing   Problem: Pain Management: Goal: General experience of comfort will improve Outcome: Progressing   Problem: Safety: Goal: Ability to remain free from injury will improve Outcome: Progressing   Problem: Skin Integrity: Goal: Risk for impaired skin integrity will decrease Outcome: Progressing   Problem: Education: Goal: Knowledge of disease or condition will improve Outcome: Progressing Goal: Knowledge of the prescribed therapeutic regimen will improve Outcome: Progressing Goal: Individualized Educational Video(s) Outcome: Progressing   Problem: Activity: Goal: Ability to tolerate increased activity will improve Outcome: Progressing Goal: Will verbalize the importance of balancing  activity with adequate rest periods Outcome: Progressing   Problem: Respiratory: Goal: Ability to maintain a clear airway will improve Outcome: Progressing Goal: Levels of oxygenation will improve Outcome: Progressing Goal: Ability to maintain adequate ventilation will improve Outcome: Progressing   Problem: Education: Goal: Knowledge of the prescribed therapeutic regimen will improve Outcome: Progressing   Problem: Coping: Goal: Ability to identify and develop effective coping behavior will improve Outcome: Progressing   Problem: Clinical Measurements: Goal: Quality of life will improve Outcome: Progressing   Problem: Respiratory: Goal: Verbalizations of increased ease of respirations will increase Outcome: Progressing   Problem: Role Relationship: Goal: Family's ability to cope with current situation will improve Outcome: Progressing Goal: Ability to verbalize concerns, feelings, and thoughts to partner or family member will improve Outcome: Progressing   Problem: Pain Management: Goal: Satisfaction with pain management regimen will improve Outcome: Progressing

## 2023-03-19 NOTE — Progress Notes (Signed)
[facility] PROGRESS NOTE    Progress Note  [name]  QMV:[id number] DOB: 09-Jan-1941 DOA: 03/16/2023 PCP: [name], MD     Brief Narrative:   [name] is an 82 y.o. female past medical history of CAD status post CABG, DVT PE not on anticoagulation due to bleed, essential hypertension abdominal aortic aneurysm repair in 1996 presents with shortness of breath and right knee pain along with encephalopathy.  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  Acute respiratory failure with hypoxia due to COPD exacerbation: Patient remains on 2 L of oxygen. Antibiotics discontinue continue supplemental oxygen. Will continue steroids for now.  Acute metabolic encephalopathy: Probably due to as above now improved.  Chronic knee pain: Continue pain management.  Anemia chronic renal disease: Hemoglobin stable.  Electrolyte imbalance: Hyponatremia, hyperphosphatemia, hypomagnesemia: No further labs were going to comfort measures.  Hypothyroidism Hyperlipidemia History of gout History of CAD status post CABG  Goals of care: The previous physician spoke to the family and they like to move towards comfort measures and using only medications related to pain and discomfort. Patient will go to residential hospice facility.  As per family.   DVT prophylaxis: lovenox Family Communication:none Status is: Inpatient Remains inpatient appropriate because: End-stage renal disease    Code Status:     Code Status Orders  (From admission, onward)           Start     Ordered   03/18/23 1118  Do not attempt resuscitation (DNR) - Comfort care  Continuous       Question Answer Comment  If  patient has no pulse and is not breathing Do Not Attempt Resuscitation   In Pre-Arrest Conditions (Patient Is Breathing and Has a Pulse) Provide comfort measures. Relieve any mechanical airway obstruction. Avoid transfer unless required for comfort.   Consent: Discussion documented in EHR or advanced directives reviewed      03/18/23 1121           Code Status History     Date Active Date Inactive Code Status Order ID Comments User Context   03/16/2023 2002 03/18/2023 1121 Limited: Do not attempt resuscitation (DNR) -DNR-LIMITED -Do Not Intubate/DNI  [id number]  Therisa Doyne, MD ED   03/16/2023 1610 03/16/2023 2002 Limited: Do not attempt resuscitation (DNR) -DNR-LIMITED -Do Not Intubate/DNI  [id number]  Rondel Baton, MD ED   02/15/2023 1031 02/27/2023 0110 Limited: Do not attempt resuscitation (DNR) -DNR-LIMITED -Do Not Intubate/DNI  [id number]  Kathlene Cote, PA-C ED   12/08/2022 0539 12/10/2022 2259 DNR [id number]  Youlanda Roys, MD ED   12/08/2022 0534 12/08/2022 0539 DNR [id number]  Youlanda Roys, MD ED   11/27/2022 0258 11/29/2022 1550 DNR [id number]  Briscoe Deutscher, MD ED   07/03/2022 0233 07/25/2022 2201 DNR [id number]  Carollee Herter, DO ED   07/03/2022 0207 07/03/2022 0233 DNR [id number]  Carollee Herter, DO ED   03/06/2022 0557 03/09/2022 1805 Full Code [id number]  Angie Fava, DO ED   02/16/2022 1429 02/22/2022 2256 Full Code [id number]  Emeline General, MD ED   02/06/2022 2320 02/11/2022 1931 Full Code [id number]  Synetta Fail, MD ED   01/23/2022 1045 01/30/2022 1751 Full Code [id number]  [name],  MD Inpatient   11/17/2021 0415 11/20/2021 1911 Full Code [id number]  Hillary Bow, DO ED   06/28/2016 1444 06/29/2016 1452 Full Code [id number]  [name], MD Inpatient   04/15/2016 1953 04/18/2016 1440 Full Code [id number]  Narda Bonds, MD Inpatient   03/02/2015 1332 03/07/2015 1318 Full Code [id number]  Ardelle Balls, PA-C Inpatient      Advance Directive Documentation     Flowsheet Row Most Recent Value  Type of Advance Directive Out of facility DNR (pink MOST or yellow form)  Pre-existing out of facility DNR order (yellow form or pink MOST form) --  "MOST" Form in Place? --         IV Access:   Peripheral IV   Procedures and diagnostic studies:   ECHOCARDIOGRAM COMPLETE  Result Date: 03/17/2023    ECHOCARDIOGRAM REPORT   Patient Name:   [name] Date of Exam: 03/17/2023 Medical Rec #:  [id number]      Height:       63.0 in Accession #:    [id number]     Weight:       129.9 lb Date of Birth:  Feb 26, 1941       BSA:          1.609 m Patient Age:    82 years       BP:           133/91 mmHg Patient Gender: F              HR:           90 bpm. Exam Location:  Inpatient Procedure: 2D Echo, Cardiac Doppler and Color Doppler Indications:    Elevated Troponin  History:        Patient has prior history of Echocardiogram examinations, most                 recent 02/07/2022. CAD, TIA, COPD and CKD, stage 4; Risk                 Factors:Hypertension and Sleep Apnea.  Sonographer:    Lucendia Herrlich RCS Referring Phys: [id number] ANASTASSIA DOUTOVA IMPRESSIONS  1. Left ventricular ejection fraction, by estimation, is 65 to 70%. The left ventricle has normal function. The left ventricle has no regional wall motion abnormalities. There is mild left ventricular hypertrophy. Left ventricular diastolic parameters are indeterminate.  2. Right ventricular systolic function is normal. The right ventricular size is normal. There is normal pulmonary artery systolic pressure. The estimated right ventricular systolic pressure is 28.8 mmHg.  3. Left atrial size was mild to moderately dilated.  4. The mitral valve is grossly normal. Mild mitral valve regurgitation. No evidence of mitral stenosis.  5. The aortic valve is tricuspid. Aortic valve regurgitation is not visualized. No aortic stenosis is present.  6. The inferior vena cava is dilated in size with >50% respiratory variability,  suggesting right atrial pressure of 8 mmHg. FINDINGS  Left Ventricle: Left ventricular ejection fraction, by estimation, is 65 to 70%. The left ventricle has normal function. The left ventricle has no regional wall motion abnormalities. The left ventricular internal cavity size was normal in size. There is  mild left ventricular hypertrophy. Left ventricular diastolic parameters are indeterminate. Right Ventricle: The right ventricular size is normal. No increase in right ventricular wall thickness. Right ventricular systolic function is normal. There is normal pulmonary artery systolic pressure. The tricuspid regurgitant velocity is 2.28 m/s, and  with an assumed right atrial pressure  of 8 mmHg, the estimated right ventricular systolic pressure is 28.8 mmHg. Left Atrium: Left atrial size was mild to moderately dilated. Right Atrium: Right atrial size was normal in size. Pericardium: There is no evidence of pericardial effusion. Mitral Valve: The mitral valve is grossly normal. Mild mitral valve regurgitation. No evidence of mitral valve stenosis. Tricuspid Valve: The tricuspid valve is normal in structure. Tricuspid valve regurgitation is mild . No evidence of tricuspid stenosis. Aortic Valve: The aortic valve is tricuspid. Aortic valve regurgitation is not visualized. No aortic stenosis is present. Aortic valve peak gradient measures 10.4 mmHg. Pulmonic Valve: The pulmonic valve was normal in structure. Pulmonic valve regurgitation is mild. No evidence of pulmonic stenosis. Aorta: The aortic root is normal in size and structure. Venous: The inferior vena cava is dilated in size with greater than 50% respiratory variability, suggesting right atrial pressure of 8 mmHg. IAS/Shunts: No atrial level shunt detected by color flow Doppler.  LEFT VENTRICLE PLAX 2D LVIDd:         4.30 cm   Diastology LVIDs:         2.60 cm   LV e' medial:    6.31 cm/s LV PW:         1.00 cm   LV E/e' medial:  20.0 LV IVS:        1.20 cm    LV e' lateral:   10.20 cm/s LVOT diam:     2.30 cm   LV E/e' lateral: 12.4 LV SV:         83 LV SV Index:   51 LVOT Area:     4.15 cm  RIGHT VENTRICLE             IVC RV S prime:     16.80 cm/s  IVC diam: 2.70 cm TAPSE (M-mode): 1.8 cm LEFT ATRIUM             Index        RIGHT ATRIUM           Index LA diam:        3.60 cm 2.24 cm/m   RA Area:     15.20 cm LA Vol (A2C):   58.2 ml 36.16 ml/m  RA Volume:   39.10 ml  24.30 ml/m LA Vol (A4C):   55.2 ml 34.30 ml/m LA Biplane Vol: 60.5 ml 37.59 ml/m  AORTIC VALVE                 PULMONIC VALVE AV Area (Vmax): 2.79 cm     PR End Diast Vel: 4.24 msec AV Vmax:        161.00 cm/s AV Peak Grad:   10.4 mmHg LVOT Vmax:      108.00 cm/s LVOT Vmean:     69.400 cm/s LVOT VTI:       0.199 m  AORTA Ao Root diam: 3.00 cm Ao Asc diam:  3.20 cm MITRAL VALVE                TRICUSPID VALVE MV Area (PHT): 5.02 cm     TR Peak grad:   20.8 mmHg MV Decel Time: 151 msec     TR Vmax:        228.00 cm/s MR Peak grad: 98.0 mmHg MR Vmax:      495.00 cm/s   SHUNTS MV E velocity: 126.00 cm/s  Systemic VTI:  0.20 m MV A velocity: 86.20 cm/s   Systemic Diam: 2.30 cm MV E/A ratio:  1.46 Weston Brass MD Electronically signed by Weston Brass MD Signature Date/Time: 03/17/2023/1:14:22 PM    Final      Medical Consultants:   None.   Subjective:    [name] nonverbal today.  Objective:    Vitals:   03/18/23 1600 03/18/23 2000 03/19/23 0525 03/19/23 0732  BP: (!) 118/49 (!) 124/52 (!) 133/51 (!) 143/53  Pulse:  84 89 89  Resp: 17 17 18 17   Temp: (!) 97.2 F (36.2 C) (!) 97.2 F (36.2 C) 97.6 F (36.4 C) 98.8 F (37.1 C)  TempSrc: Oral Oral Oral   SpO2:  92% 93% 92%  Weight:      Height:       SpO2: 92 % O2 Flow Rate (L/min): 0.2 L/min  No intake or output data in the 24 hours ending 03/19/23 1007 Filed Weights   03/16/23 1439  Weight: 58.9 kg    Exam: General exam: In no acute distress. Respiratory system: Good air movement and clear to  auscultation. Cardiovascular system: S1 & S2 heard, RRR. No JV. Gastrointestinal system: Abdomen is nondistended, soft and nontender.  Skin: No rashes, lesions or ulcers  Data Reviewed:    Labs: Basic Metabolic Panel: Recent Labs  Lab 03/16/23 1420 03/16/23 2205 03/17/23 0323  NA 132*  --  136  K 3.5  --  3.9  CL 102  --  103  CO2 12*  --  9*  GLUCOSE 113*  --  147*  BUN 136*  --  144*  CREATININE 7.82*  --  8.29*  CALCIUM 7.4*  --  7.4*  MG  --  1.0* 1.6*  PHOS  --  10.0* 10.6*   GFR Estimated Creatinine Clearance: 4.2 mL/min (A) (by C-G formula based on SCr of 8.29 mg/dL (H)). Liver Function Tests: Recent Labs  Lab 03/16/23 1420 03/17/23 0323  AST 21 21  ALT 16 19  ALKPHOS 69 74  BILITOT 0.9 0.7  PROT 4.7* 5.1*  ALBUMIN 2.2* 2.3*   No results for input(s): "LIPASE", "AMYLASE" in the last 168 hours. Recent Labs  Lab 03/16/23 2205  AMMONIA <10   Coagulation profile No results for input(s): "INR", "PROTIME" in the last 168 hours. COVID-19 Labs  Recent Labs    03/16/23 2205  FERRITIN 489*    Lab Results  Component Value Date   SARSCOV2NAA NEGATIVE 03/16/2023   SARSCOV2NAA NEGATIVE 07/02/2022   SARSCOV2NAA NEGATIVE 02/06/2022   SARSCOV2NAA NEGATIVE 09/15/2019    CBC: Recent Labs  Lab 03/12/23 1118 03/16/23 1420 03/17/23 0323  WBC  --  10.3 9.3  NEUTROABS  --  7.7  --   HGB 8.6* 7.8* 8.2*  HCT  --  23.6* 24.7*  MCV  --  95.9 97.2  PLT  --  242 281   Cardiac Enzymes: Recent Labs  Lab 03/16/23 2205  CKTOTAL 78   BNP (last 3 results) No results for input(s): "PROBNP" in the last 8760 hours. CBG: No results for input(s): "GLUCAP" in the last 168 hours. D-Dimer: No results for input(s): "DDIMER" in the last 72 hours. Hgb A1c: No results for input(s): "HGBA1C" in the last 72 hours. Lipid Profile: No results for input(s): "CHOL", "HDL", "LDLCALC", "TRIG", "CHOLHDL", "LDLDIRECT" in the last 72 hours. Thyroid function studies: Recent  Labs    03/16/23 2205  TSH 1.270   Anemia work up: Recent Labs    03/16/23 2205 03/17/23 0323  VITAMINB12  --  6,237*  FOLATE  --  >40.0  FERRITIN  489*  --   TIBC 147*  --   IRON 19*  --   RETICCTPCT 3.1  --    Sepsis Labs: Recent Labs  Lab 03/16/23 1420 03/16/23 2205 03/16/23 2320 03/17/23 0323  PROCALCITON  --  0.52  --   --   WBC 10.3  --   --  9.3  LATICACIDVEN  --  0.8 0.6  --    Microbiology Recent Results (from the past 240 hour(s))  SARS Coronavirus 2 by RT PCR (hospital order, performed in [facility] hospital lab) *cepheid single result test* Anterior Nasal Swab     Status: None   Collection Time: 03/16/23  3:25 PM   Specimen: Anterior Nasal Swab  Result Value Ref Range Status   SARS Coronavirus 2 by RT PCR NEGATIVE NEGATIVE Final    Comment: Performed at [facility], [address] [place], [place] [address]  Respiratory (~20 pathogens) panel by PCR     Status: None   Collection Time: 03/16/23  7:36 PM   Specimen: Nasopharyngeal Swab; Respiratory  Result Value Ref Range Status   Adenovirus NOT DETECTED NOT DETECTED Final   Coronavirus 229E NOT DETECTED NOT DETECTED Final    Comment: (NOTE) The Coronavirus on the Respiratory Panel, DOES NOT test for the novel  Coronavirus (2019 nCoV)    Coronavirus HKU1 NOT DETECTED NOT DETECTED Final   Coronavirus NL63 NOT DETECTED NOT DETECTED Final   Coronavirus OC43 NOT DETECTED NOT DETECTED Final   Metapneumovirus NOT DETECTED NOT DETECTED Final   Rhinovirus / Enterovirus NOT DETECTED NOT DETECTED Final   Influenza A NOT DETECTED NOT DETECTED Final   Influenza B NOT DETECTED NOT DETECTED Final   Parainfluenza Virus 1 NOT DETECTED NOT DETECTED Final   Parainfluenza Virus 2 NOT DETECTED NOT DETECTED Final   Parainfluenza Virus 3 NOT DETECTED NOT DETECTED Final   Parainfluenza Virus 4 NOT DETECTED NOT DETECTED Final   Respiratory Syncytial Virus NOT DETECTED NOT DETECTED Final   Bordetella pertussis NOT  DETECTED NOT DETECTED Final   Bordetella Parapertussis NOT DETECTED NOT DETECTED Final   Chlamydophila pneumoniae NOT DETECTED NOT DETECTED Final   Mycoplasma pneumoniae NOT DETECTED NOT DETECTED Final    Comment: Performed at [facility], [address] [place], [place] [address]  MRSA Next Gen by PCR, Nasal     Status: None   Collection Time: 03/17/23  3:23 AM   Specimen: Nasal Mucosa; Nasal Swab  Result Value Ref Range Status   MRSA by PCR Next Gen NOT DETECTED NOT DETECTED Final    Comment: (NOTE) The GeneXpert MRSA Assay (FDA approved for NASAL specimens only), is one component of a comprehensive MRSA colonization surveillance program. It is not intended to diagnose MRSA infection nor to guide or monitor treatment for MRSA infections. Test performance is not FDA approved in patients less than 105 years old. Performed at [facility], [address], [place], [place] [address]      Medications:    leflunomide  20 mg Oral Daily   predniSONE  40 mg Oral Q breakfast   sertraline  25 mg Oral Daily   Continuous Infusions:    LOS: 3 days   Marinda Elk  [facility]  03/19/2023, 10:07 AM

## 2023-03-19 NOTE — Progress Notes (Signed)
Daily Progress Note   Patient Name: [name]       Date: 03/19/2023 DOB: 05-06-1940  Age: 82 y.o. MRN#: [id number] Attending Physician: Sherry Elk, MD Primary Care Physician: Sherry Garter, MD Admit Date: 03/16/2023  Reason for Consultation/Follow-up: Non pain symptom management, Pain control, Psychosocial/spiritual support, and Terminal Care  Subjective: I have reviewed medical records including EPIC notes, MAR, and labs. Received report from primary RN - no acute concerns. Discussed patient's case with Dr. David [name] and TOC. RN does note that patient experienced difficulty urinating last night, though was able to urinate this morning as well as had a BM. Will order bladder scans with instructions for foley placement if needed.  Went to visit patient at bedside - three children and her spouse were present. TOC finishing discussion regarding discharge - family have opted for patient's discharge to [facility]. Patient was lying in bed - she is lethargic but able to participate in simple conversation. No signs or non-verbal gestures of pain or discomfort noted. No respiratory distress, increased work of breathing, or secretions noted. She is on 3L O2 [place]. Patient does endorse shortness of breath - noted RN gave dilaudid about 5 minutes prior to arrival. Reviewed this medication should assist with shortness of breath symptoms within 30 minutes; however, if still uncomfortable encouraged patient/family to notify RN and dose can be adjusted. Family indicate they are worried the 0.25mg  dose of dilaudid is too strong, making her too sleepy - reviewed opiates and the balance between benefits and side effects in context of comfort goals as well as the low dose she is getting - they  expressed understanding.   RT also arrived to bedside to administer PRN breathing treatment - patient accepted.  Detailed conversation was had regarding medication education, symptom management, and natural trajectory at end of life. [employer] book provided.   Family question the process for patient's transfer to hospice facility. Reviewed process of referral, [facility] evaluation, and pending bed availability. Provided reassurance that residential hospice referral could be cancelled (would anticipate hospital death) at any time if patient's condition changed and it was felt they were too unstable for transfer. Family are hopeful she can get to [facility]; however, indicate if she is not stable they are ok with her remaining in house for  EOL care. Their "main goal is her comfort." Family indicate son/[name] to be primary contact - will notify hospice liaison.   Family express concern regarding patient's urination - reviewed plan for bladder scans every shift and foley placement if needed - they expressed appreciation.   All questions and concerns addressed. Encouraged to call with questions and/or concerns. PMT card provided.  Length of Stay: 3  Current Medications: Scheduled Meds:   leflunomide  20 mg Oral Daily   predniSONE  40 mg Oral Q breakfast   sertraline  25 mg Oral Daily    Continuous Infusions:   PRN Meds: acetaminophen **OR** acetaminophen, albuterol, albuterol, antiseptic oral rinse, bisacodyl, diphenhydrAMINE, docusate sodium, glycopyrrolate **OR** glycopyrrolate **OR** glycopyrrolate, HYDROcodone-acetaminophen, HYDROmorphone (DILAUDID) injection, HYDROmorphone HCl, LORazepam **OR** LORazepam **OR** LORazepam, ondansetron **OR** ondansetron (ZOFRAN) IV, polyvinyl alcohol  Physical Exam Vitals and nursing note reviewed.  Constitutional:      General: She is not in acute distress.    Appearance: She is cachectic. She is ill-appearing.  Pulmonary:     Effort: No  respiratory distress.  Skin:    General: Skin is warm and dry.  Neurological:     Mental Status: She is lethargic.     Motor: Weakness present.             Vital Signs: BP (!) 143/53 (BP Location: Right Arm)   Pulse 89   Temp 98.8 F (37.1 C)   Resp 17   Ht 5\' 3"  (1.6 m)   Wt 58.9 kg   SpO2 90%   BMI 23.00 kg/m  SpO2: SpO2: 90 % O2 Device: O2 Device: Nasal Cannula O2 Flow Rate: O2 Flow Rate (L/min): (S) 1 L/min (per Rn request)  Intake/output summary: No intake or output data in the 24 hours ending 03/19/23 1037 LBM: Last BM Date : 03/19/23 Baseline Weight: Weight: 58.9 kg Most recent weight: Weight: 58.9 kg       Palliative Assessment/Data: PPS 10-20%      Patient Active Problem List   Diagnosis Date Noted   COPD exacerbation (HCC) 03/16/2023   Leg edema 03/16/2023   Knee pain 03/16/2023   Rectal prolapse 03/13/2023   Urogenital candidiasis 03/13/2023   Mild cognitive impairment 03/06/2023   Pneumonia of right lower lobe due to infectious organism 03/05/2023   Leukocytosis 03/05/2023   Post herpetic neuralgia 02/27/2023   Hypertensive urgency 02/15/2023   Zoster 02/08/2023   Headache 02/08/2023   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   Acute otitis externa of left ear 01/26/2023   Tooth abscess 01/26/2023   COVID-19 12/25/2022   GI bleed 12/08/2022   Bleeding hemorrhoids 12/08/2022   Rectal bleeding 11/30/2022   Acute GI bleeding 11/27/2022   Chronic anticoagulation 11/27/2022   Lower GI bleed 11/27/2022   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   Anxiety 10/02/2022   Severe obstructive sleep apnea 08/24/2022   Nasal bones, closed fracture 08/07/2022   Edema 07/26/2022   Acute gastric ulcer with hemorrhage 07/08/2022   Duodenal ulcer 07/08/2022   Dark stools 07/07/2022   Anemia 07/07/2022   E coli bacteremia 07/04/2022   Acute pyelonephritis 07/03/2022   Acute metabolic encephalopathy 07/03/2022   Hyponatremia 07/03/2022   Sepsis with  acute renal failure (HCC) 07/03/2022   DNR (do not resuscitate)/DNI(Do Not Intubate) 07/03/2022   Generalized weakness 05/23/2022   Gallstones 03/17/2022   Acute renal failure superimposed on stage 4 chronic kidney disease (HCC) 03/06/2022   Metabolic acidosis, increased anion gap 03/06/2022   Hip dislocation, left (  HCC) 03/06/2022   Anemia due to chronic kidney disease 03/06/2022   History of DVT (deep vein thrombosis) 03/06/2022   Allergic rhinitis 03/06/2022   History of pulmonary embolism 02/06/2022   Vitamin D deficiency 01/30/2022   Slow transit constipation 01/30/2022   GERD (gastroesophageal reflux disease) 01/30/2022   Status post total replacement of left hip 01/23/2022   Melena    Upper GI bleed 11/17/2021   CKD (chronic kidney disease) stage 4, GFR 15-29 ml/min (HCC) - baseline SCr 2.5. follows with Dr. Malen Gauze with [facility] 11/17/2021   Primary osteoarthritis of left hip 10/06/2021   Aortic atherosclerosis (HCC) 09/07/2020   Neoplasm of uncertain behavior of skin 03/14/2020   Arthralgia 12/25/2019   Ankle swelling 08/08/2019   Osteoporosis 02/26/2018   Colon polyp 06/28/2016   COPD (chronic obstructive pulmonary disease) (HCC) 05/28/2016   Upper airway cough syndrome 05/22/2016   Acute respiratory failure with hypoxia (HCC) 04/15/2016   Wrist pain, right 12/14/2015   Lung mass 03/02/2015   Pre-operative cardiovascular examination 02/21/2015   DOE (dyspnea on exertion) 02/16/2015   TIA (transient ischemic attack) 12/31/2014   Abnormal mental state 12/30/2014   Well adult exam 11/26/2014   Seasonal allergies 12/31/2013   Splenic artery aneurysm (HCC) 04/07/2013   Cerumen impaction 03/20/2013   Hyperlipidemia with target LDL less than 70    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   Anticoagulated 11/08/2010   Rheumatoid arthritis (HCC) 06/23/2010   Pain in joint 11/26/2009   Acquired hypothyroidism 12/08/2006   Macular degeneration (senile) of retina  12/08/2006   Essential hypertension 12/08/2006   Diaphragmatic hernia 12/08/2006   ASPLENIA 12/08/2006   Coronary artery disease involving native coronary artery of native heart without angina pectoris 01/30/1995   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) 12/22/1994   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; now atretic and significantly improved LAD lesion without intervention 12/22/1994    Palliative Care Assessment & Plan   Patient Profile: 82 y.o. 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    Patient has had 3 admissions in the past 6 months, as well as 2 ED visits. She was noted to be more lethargic at SNF. Currently admitted for AKI on CKD IV , COPD exacerbation, and acute metabolic encephalopathy likely due to uremia. Patient does not want HD.  Assessment: Principal Problem:   COPD exacerbation (HCC) Active Problems:   Acquired hypothyroidism   Hyperlipidemia with target LDL less than 70   Acute respiratory failure with hypoxia (HCC)   Acute renal failure superimposed on stage 4 chronic kidney disease (HCC)   Anemia due to chronic kidney disease   Acute metabolic encephalopathy   Hyponatremia   Coronary artery disease involving native coronary artery of native heart without angina pectoris   Leg edema   Knee pain   Terminal care  Recommendations/Plan: Continue full comfort measures Continue DNR/DNI as previously documented - durable DNR form completed and placed in shadow chart Transfer to [facility] pending eligibility confirmation and bed availability Added orders for qshift bladder scans with instructions to place foley for EOL comfort if >24ml Continue current comfort focused medication regimen - no changes PMT  will continue to follow and support holistically   Symptom Management Dilaudid PRN severe  pain/dyspnea/increased work of breathing/RR>25 Norco PRN moderate pain Tylenol PRN pain/fever Biotin PRN dry mouth Benadryl PRN itching Robinul PRN secretions Ativan PRN anxiety/seizure/sleep/distress Zofran PRN nausea/vomiting Liquifilm Tears PRN dry eye Albuterol neb PRN wheezing Continue arava and prednisone while tolerating POs   Goals of Care and Additional Recommendations: Limitations on Scope of Treatment: Full Comfort Care  Code Status:    Code Status Orders  (From admission, onward)           Start     Ordered   03/18/23 1118  Do not attempt resuscitation (DNR) - Comfort care  Continuous       Question Answer Comment  If patient has no pulse and is not breathing Do Not Attempt Resuscitation   In Pre-Arrest Conditions (Patient Is Breathing and Has a Pulse) Provide comfort measures. Relieve any mechanical airway obstruction. Avoid transfer unless required for comfort.   Consent: Discussion documented in EHR or advanced directives reviewed      03/18/23 1121           Code Status History     Date Active Date Inactive Code Status Order ID Comments User Context   03/16/2023 2002 03/18/2023 1121 Limited: Do not attempt resuscitation (DNR) -DNR-LIMITED -Do Not Intubate/DNI  [id number]  Therisa Doyne, MD ED   03/16/2023 1610 03/16/2023 2002 Limited: Do not attempt resuscitation (DNR) -DNR-LIMITED -Do Not Intubate/DNI  [id number]  Rondel Baton, MD ED   02/15/2023 1031 02/27/2023 0110 Limited: Do not attempt resuscitation (DNR) -DNR-LIMITED -Do Not Intubate/DNI  [id number]  Kathlene Cote, PA-C ED   12/08/2022 0539 12/10/2022 2259 DNR [id number]  Youlanda Roys, MD ED   12/08/2022 0534 12/08/2022 0539 DNR [id number]  Youlanda Roys, MD ED   11/27/2022 0258 11/29/2022 1550 DNR [id number]  Briscoe Deutscher, MD ED   07/03/2022 0233 07/25/2022 2201 DNR [id number]  Carollee Herter, DO ED    07/03/2022 0207 07/03/2022 0233 DNR [id number]  Carollee Herter, DO ED   03/06/2022 0557 03/09/2022 1805 Full Code [id number]  Howerter, Chaney Born, DO ED   02/16/2022 1429 02/22/2022 2256 Full Code [id number]  Emeline General, MD ED   02/06/2022 2320 02/11/2022 1931 Full Code [id number]  Synetta Fail, MD ED   01/23/2022 1045 01/30/2022 1751 Full Code [id number]  [name], MD Inpatient   11/17/2021 0415 11/20/2021 1911 Full Code [id number]  Hillary Bow, DO ED   06/28/2016 1444 06/29/2016 1452 Full Code [id number]  [name], MD Inpatient   04/15/2016 1953 04/18/2016 1440 Full Code [id number]  Narda Bonds, MD Inpatient   03/02/2015 1332 03/07/2015 1318 Full Code [id number]  Ardelle Balls, PA-C Inpatient      Advance Directive Documentation    Flowsheet Row Most Recent Value  Type of Advance Directive Out of facility DNR (pink MOST or yellow form)  Pre-existing out of facility DNR order (yellow form or pink MOST form) --  "MOST" Form in Place? --       Prognosis:  < 2 weeks  Discharge Planning: Hospice facility  Care plan was discussed with primary RN, patient's family, patient, TOC, Dr. David [name]  Thank you for allowing the Palliative Medicine Team to assist in the care of this patient.     Haskel Khan, NP  Please contact Palliative Medicine Team phone at [phone] for questions and concerns.   *Portions of this note are a verbal dictation therefore any spelling and/or grammatical errors are due to the "[employer]" system interpretation.

## 2023-03-19 NOTE — Discharge Summary (Signed)
Physician Discharge Summary  OCEANA CUSH OZH:[id number] DOB: 1940-05-03 DOA: 03/16/2023  PCP: [name], MD  Admit date: 03/16/2023 Discharge date: 03/19/2023  Admitted From: Home  Disposition:  Residential Hospice   Recommendations for Outpatient Follow-up:  Comfort measure  Home Health:No  Equipment/Devices:None   Discharge Condition:hospice CODE STATUS:DNR  Diet recommendation: Heart Healthy  Brief/Interim Summary: 82 y.o. female past medical history of CAD status post CABG, DVT PE not on anticoagulation due to bleed, essential hypertension abdominal aortic aneurysm repair in 1996 presents with shortness of breath and right knee pain along with encephalopathy.  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 Palliative care was consulted and she will go to residential hospice facility to respiratory   Acute failure with hypoxia due to COPD exacerbation: Patient remains on 2 L of oxygen, antibiotic were discontinued. She remains on 2 L of oxygen. Dilaudid for shortness of breath.  Acute metabolic encephalopathy: Likely due to acute kidney injury.  Chronic knee  pain: Continue narcotics for pain management.  Anemia of chronic renal disease: Hemoglobin is stable.  Electrolyte imbalance; hyponatremia, hypophosphatemia, hypomagnesemia: Once she became comfort measures these were not monitor.  Hypothyroidism Hyperlipidemia History of gout History of CAD status post CABG   Discharge Instructions  Discharge Instructions     Diet - low sodium heart healthy   Complete by: As directed    Increase activity slowly   Complete by: As directed       Allergies as of 03/19/2023       Reactions   Nsaids Other (See Comments)   Stomach upset Told to avoid due to Eliquis   Aspirin Other (See Comments)   Stomach upset Told to avoid due to Eliquis   Codeine Nausea And Vomiting   Eliquis [apixaban] Other (See Comments)   Gi upset    Nitrostat [nitroglycerin] Other (See Comments)   Bradycardia. Drop in heart rate    Tramadol Other (See Comments)   Confusion        Medication List     STOP taking these medications    acetaminophen 325 MG tablet Commonly known as: TYLENOL   albuterol 108 (90 Base) MCG/ACT inhaler Commonly known as: VENTOLIN HFA   allopurinol 100 MG tablet Commonly known as: ZYLOPRIM   amLODipine 10 MG tablet Commonly known as: NORVASC   ascorbic acid 500 MG tablet Commonly known as: VITAMIN C   atorvastatin 80 MG tablet Commonly known as: LIPITOR   B-complex with vitamin C tablet   bisacodyl 5 MG EC tablet Commonly known as: DULCOLAX   carvedilol 6.25 MG tablet Commonly known as: COREG   cloNIDine 0.2 mg/24hr patch Commonly known as:  CATAPRES - Dosed in mg/24 hr   clopidogrel 75 MG tablet Commonly known as: PLAVIX   Cyanocobalamin 2500 MCG Tabs   docusate sodium 100 MG capsule Commonly known as: COLACE   hydrOXYzine 25 MG tablet Commonly known as: ATARAX   leflunomide 20 MG tablet Commonly known as: ARAVA   levothyroxine 88 MCG tablet Commonly known as: SYNTHROID   melatonin 3 MG Tabs  tablet   pantoprazole 40 MG tablet Commonly known as: PROTONIX   sertraline 25 MG tablet Commonly known as: ZOLOFT   triamcinolone 55 MCG/ACT Aero nasal inhaler Commonly known as: NASACORT   Vitamin D3 50 MCG (2000 UT) capsule       TAKE these medications    HYDROmorphone 1 MG/ML injection Commonly known as: DILAUDID Inject 0.25 mLs (0.25 mg total) into the vein every 30 (thirty) minutes as needed for moderate pain (pain score 4-6) (DYSPNEA).   HYDROmorphone HCl 1 MG/ML Liqd Commonly known as: DILAUDID Take 1 mL (1 mg total) by mouth every 2 (two) hours as needed for moderate pain (pain score 4-6) (dyspnea).        Follow-up Information     Schedule an appointment as soon as possible for a visit  with [facility] with your PCP/Specialist as discussed.   Contact information: [email or web address] Call our physician referral line at [phone].               Allergies  Allergen Reactions   Nsaids Other (See Comments)    Stomach upset Told to avoid due to Eliquis   Aspirin Other (See Comments)    Stomach upset Told to avoid due to Eliquis   Codeine Nausea And Vomiting   Eliquis [Apixaban] Other (See Comments)    Gi upset    Nitrostat [Nitroglycerin] Other (See Comments)    Bradycardia. Drop in heart rate    Tramadol Other (See Comments)    Confusion    Consultations:  Palliative care   Procedures/Studies: VAS [place] LOWER EXTREMITY VENOUS (DVT)  Result Date: 03/17/2023  Lower Venous DVT Study Patient Name:  TAIDE TAFUR  Date of Exam:   03/17/2023 Medical Rec #: [id number]       Accession #:    [id number] Date of Birth: 10/15/1940        Patient Gender: F Patient Age:   6 years Exam Location:  [facility] Procedure:      VAS [place] LOWER EXTREMITY VENOUS (DVT) Referring Phys: Jonny Ruiz DOUTOVA --------------------------------------------------------------------------------  Indications: Edema, SOB, and Right knee pain.   Limitations: Edema, pain with compression, involuntary patient movement. Comparison Study: Prior negative bilateral LEV done 12/09/22 Performing Technologist: Sherren Kerns RVS  Examination Guidelines: A complete evaluation includes B-mode imaging, spectral Doppler, color Doppler, and power Doppler as needed of all accessible portions of each vessel. Bilateral testing is considered an integral part of a complete examination. Limited examinations for reoccurring indications may be performed as noted. The reflux portion of the exam is performed with the patient in reverse Trendelenburg.  +---------+---------------+---------+-----------+---------------+--------------+ RIGHT    CompressibilityPhasicitySpontaneityProperties     Thrombus Aging +---------+---------------+---------+-----------+---------------+--------------+ CFV      Full           Yes      No         pulsatile  waveforms                     +---------+---------------+---------+-----------+---------------+--------------+ SFJ      Full                                                             +---------+---------------+---------+-----------+---------------+--------------+ FV Prox  Full           Yes      Yes        pulsatile                                                                 waveforms                     +---------+---------------+---------+-----------+---------------+--------------+ FV Mid                  No       Yes                       patent by                                                                 color and                                                                 Doppler        +---------+---------------+---------+-----------+---------------+--------------+ FV Distal               Yes      Yes        pulsatile      patent by                                                  waveforms       color and                                                                 Doppler        +---------+---------------+---------+-----------+---------------+--------------+ PFV      Full           Yes      Yes                                      +---------+---------------+---------+-----------+---------------+--------------+  POP      Full           Yes      Yes        pulsatile      patent by                                                  waveforms      color and                                                                 Doppler        +---------+---------------+---------+-----------+---------------+--------------+ PTV      Full                                                             +---------+---------------+---------+-----------+---------------+--------------+ PERO                                                       Not well                                                                  visualized     +---------+---------------+---------+-----------+---------------+--------------+ Gastroc  Full                                                             +---------+---------------+---------+-----------+---------------+--------------+ Fluid noted medial knee, etiology unknown  +---------+---------------+---------+-----------+---------------+--------------+ LEFT     CompressibilityPhasicitySpontaneityProperties     Thrombus Aging +---------+---------------+---------+-----------+---------------+--------------+ CFV      Full           Yes      Yes        pulsatile                                                                 waveforms                     +---------+---------------+---------+-----------+---------------+--------------+ SFJ      Full                                                             +---------+---------------+---------+-----------+---------------+--------------+  FV Prox                  Yes      No         pulsatile      patent by                                                  waveforms      color and                                                                 Doppler        +---------+---------------+---------+-----------+---------------+--------------+ FV Mid                  Yes      Yes        pulsatile      patent by                                                  waveforms      color and                                                                 Doppler        +---------+---------------+---------+-----------+---------------+--------------+ FV Distal               Yes      No         pulsatile      patent by                                                  waveforms      color and                                                                 Doppler        +---------+---------------+---------+-----------+---------------+--------------+ PFV                     Yes      No         pulsatile      patent by  waveforms      color and                                                                 Doppler        +---------+---------------+---------+-----------+---------------+--------------+ POP      Full           Yes      Yes        pulsatile                                                                 waveforms                     +---------+---------------+---------+-----------+---------------+--------------+ PTV      Full                                                             +---------+---------------+---------+-----------+---------------+--------------+ PERO     Full                                                             +---------+---------------+---------+-----------+---------------+--------------+ Gastroc  Full                                                              +---------+---------------+---------+-----------+---------------+--------------+     Summary: RIGHT: - Edema noted medial knee, etiology unknown Interstitial fluid noted throughout right lower extremity. Pulsatile waveforms noted.  LEFT: - A cystic structure is found in the popliteal fossa. Interstitial edema noted throughout left lower extremity. Pulsatile waveforms noted.  *See table(s) above for measurements and observations. Electronically signed by Lemar Livings MD on 03/17/2023 at 1:31:50 PM.    Final    ECHOCARDIOGRAM COMPLETE  Result Date: 03/17/2023    ECHOCARDIOGRAM REPORT   Patient Name:   LARESHA THEBEAU Date of Exam: 03/17/2023 Medical Rec #:  [id number]      Height:       63.0 in Accession #:    [id number]     Weight:       129.9 lb Date of Birth:  1940-07-02       BSA:          1.609 m Patient Age:    82 years       BP:           133/91 mmHg Patient Gender: F              HR:  90 bpm. Exam Location:  Inpatient Procedure: 2D Echo, Cardiac Doppler and Color Doppler Indications:    Elevated Troponin  History:        Patient has prior history of Echocardiogram examinations, most                 recent 02/07/2022. CAD, TIA, COPD and CKD, stage 4; Risk                 Factors:Hypertension and Sleep Apnea.  Sonographer:    Lucendia Herrlich RCS Referring Phys: [id number] ANASTASSIA DOUTOVA IMPRESSIONS  1. Left ventricular ejection fraction, by estimation, is 65 to 70%. The left ventricle has normal function. The left ventricle has no regional wall motion abnormalities. There is mild left ventricular hypertrophy. Left ventricular diastolic parameters are indeterminate.  2. Right ventricular systolic function is normal. The right ventricular size is normal. There is normal pulmonary artery systolic pressure. The estimated right ventricular systolic pressure is 28.8 mmHg.  3. Left atrial size was mild to moderately dilated.  4. The mitral valve is grossly normal. Mild mitral valve regurgitation. No evidence  of mitral stenosis.  5. The aortic valve is tricuspid. Aortic valve regurgitation is not visualized. No aortic stenosis is present.  6. The inferior vena cava is dilated in size with >50% respiratory variability, suggesting right atrial pressure of 8 mmHg. FINDINGS  Left Ventricle: Left ventricular ejection fraction, by estimation, is 65 to 70%. The left ventricle has normal function. The left ventricle has no regional wall motion abnormalities. The left ventricular internal cavity size was normal in size. There is  mild left ventricular hypertrophy. Left ventricular diastolic parameters are indeterminate. Right Ventricle: The right ventricular size is normal. No increase in right ventricular wall thickness. Right ventricular systolic function is normal. There is normal pulmonary artery systolic pressure. The tricuspid regurgitant velocity is 2.28 m/s, and  with an assumed right atrial pressure of 8 mmHg, the estimated right ventricular systolic pressure is 28.8 mmHg. Left Atrium: Left atrial size was mild to moderately dilated. Right Atrium: Right atrial size was normal in size. Pericardium: There is no evidence of pericardial effusion. Mitral Valve: The mitral valve is grossly normal. Mild mitral valve regurgitation. No evidence of mitral valve stenosis. Tricuspid Valve: The tricuspid valve is normal in structure. Tricuspid valve regurgitation is mild . No evidence of tricuspid stenosis. Aortic Valve: The aortic valve is tricuspid. Aortic valve regurgitation is not visualized. No aortic stenosis is present. Aortic valve peak gradient measures 10.4 mmHg. Pulmonic Valve: The pulmonic valve was normal in structure. Pulmonic valve regurgitation is mild. No evidence of pulmonic stenosis. Aorta: The aortic root is normal in size and structure. Venous: The inferior vena cava is dilated in size with greater than 50% respiratory variability, suggesting right atrial pressure of 8 mmHg. IAS/Shunts: No atrial level shunt  detected by color flow Doppler.  LEFT VENTRICLE PLAX 2D LVIDd:         4.30 cm   Diastology LVIDs:         2.60 cm   LV e' medial:    6.31 cm/s LV PW:         1.00 cm   LV E/e' medial:  20.0 LV IVS:        1.20 cm   LV e' lateral:   10.20 cm/s LVOT diam:     2.30 cm   LV E/e' lateral: 12.4 LV SV:         83 LV SV Index:  51 LVOT Area:     4.15 cm  RIGHT VENTRICLE             IVC RV S prime:     16.80 cm/s  IVC diam: 2.70 cm TAPSE (M-mode): 1.8 cm LEFT ATRIUM             Index        RIGHT ATRIUM           Index LA diam:        3.60 cm 2.24 cm/m   RA Area:     15.20 cm LA Vol (A2C):   58.2 ml 36.16 ml/m  RA Volume:   39.10 ml  24.30 ml/m LA Vol (A4C):   55.2 ml 34.30 ml/m LA Biplane Vol: 60.5 ml 37.59 ml/m  AORTIC VALVE                 PULMONIC VALVE AV Area (Vmax): 2.79 cm     PR End Diast Vel: 4.24 msec AV Vmax:        161.00 cm/s AV Peak Grad:   10.4 mmHg LVOT Vmax:      108.00 cm/s LVOT Vmean:     69.400 cm/s LVOT VTI:       0.199 m  AORTA Ao Root diam: 3.00 cm Ao Asc diam:  3.20 cm MITRAL VALVE                TRICUSPID VALVE MV Area (PHT): 5.02 cm     TR Peak grad:   20.8 mmHg MV Decel Time: 151 msec     TR Vmax:        228.00 cm/s MR Peak grad: 98.0 mmHg MR Vmax:      495.00 cm/s   SHUNTS MV E velocity: 126.00 cm/s  Systemic VTI:  0.20 m MV A velocity: 86.20 cm/s   Systemic Diam: 2.30 cm MV E/A ratio:  1.46 Weston Brass MD Electronically signed by Weston Brass MD Signature Date/Time: 03/17/2023/1:14:22 PM    Final    CT HEAD WO CONTRAST ( )  Result Date: 03/16/2023 CLINICAL DATA:  Delirium EXAM: CT HEAD WITHOUT CONTRAST TECHNIQUE: Contiguous axial images were obtained from the base of the skull through the vertex without intravenous contrast. RADIATION DOSE REDUCTION: This exam was performed according to the departmental dose-optimization program which includes automated exposure control, adjustment of the mA and/or kV according to patient size and/or use of iterative reconstruction  technique. COMPARISON:  02/15/2023 FINDINGS: Brain: No acute infarct or hemorrhage. Lateral ventricles and midline structures are unremarkable. No acute extra-axial fluid collections. No mass effect. Stable diffuse cerebral atrophy. Vascular: Stable atherosclerosis.  No hyperdense vessel. Skull: Normal. Negative for fracture or focal lesion. Sinuses/Orbits: No acute finding. Other: None. IMPRESSION: 1. Stable head CT, no acute intracranial process. Electronically Signed   By: Sharlet Salina M.D.   On: 03/16/2023 22:17   DG Knee 1-2 Views Right  Result Date: 03/16/2023 CLINICAL DATA:  Right knee pain. EXAM: RIGHT KNEE - 1-2 VIEW COMPARISON:  Right knee radiograph dated 02/18/2023. FINDINGS: There is no acute fracture or dislocation. The bones are osteopenic. There is degenerative changes with moderate narrowing of the lateral compartment. There is a large suprapatellar effusion. The soft tissues are unremarkable. IMPRESSION: 1. No acute fracture or dislocation. 2. Large suprapatellar effusion. Electronically Signed   By: Elgie Collard M.D.   On: 03/16/2023 21:11   DG Chest Portable 1 View  Result Date: 03/16/2023 CLINICAL DATA:  Short of breath EXAM:  PORTABLE CHEST 1 VIEW COMPARISON:  02/15/2023 FINDINGS: Single frontal view of the chest demonstrates stable postsurgical changes from CABG. The cardiac silhouette is unremarkable. Interval development of trace bilateral pleural effusions. Streaky opacities at the left lung base could reflect atelectasis or scarring. No acute airspace disease or pneumothorax. No acute bony abnormality. IMPRESSION: 1. Trace bilateral pleural effusions. 2. Streaky left basilar consolidation likely atelectasis or scarring. 3. No acute airspace disease. Electronically Signed   By: Sharlet Salina M.D.   On: 03/16/2023 17:46   CT ABDOMEN PELVIS WO CONTRAST  Result Date: 02/24/2023 CLINICAL DATA:  Abdominal pain, acute, nonlocalized. Stomach distention. EXAM: CT ABDOMEN AND  PELVIS WITHOUT CONTRAST TECHNIQUE: Multidetector CT imaging of the abdomen and pelvis was performed following the standard protocol without IV contrast. RADIATION DOSE REDUCTION: This exam was performed according to the departmental dose-optimization program which includes automated exposure control, adjustment of the mA and/or kV according to patient size and/or use of iterative reconstruction technique. COMPARISON:  01/22/2012. FINDINGS: Lower chest: The heart is enlarged. There is a trace left pleural effusion. Atelectasis or scarring is noted at the lung bases bilaterally. Hepatobiliary: The left lobe of the liver has a nodular contour, suggesting underlying cirrhosis. No focal abnormality is seen in the liver. No biliary ductal dilatation. Stones are present within the gallbladder. Pancreas: Unremarkable. No pancreatic ductal dilatation or surrounding inflammatory changes. Spleen: Surgically absent. Adrenals/Urinary Tract: Adrenal glands are within normal limits. Cysts are present in the kidneys bilaterally. No renal calculus or hydronephrosis bilaterally. The visualized portion of the urinary bladder is within normal limits. Stomach/Bowel: There is a small hiatal hernia. Stomach is within normal limits. Appendix is not seen. There is diffuse rectal wall thickening with surrounding fat stranding. Scattered diverticula are present along the colon without evidence of diverticulitis. No free air or pneumatosis. Vascular/Lymphatic: Aortic atherosclerosis. No enlarged abdominal or pelvic lymph nodes. Reproductive: Uterus and bilateral adnexa are unremarkable. Other: No abdominopelvic ascites. Musculoskeletal: Degenerative changes are present in the thoracolumbar spine. Total hip arthroplasty changes are present on the left. No acute osseous abnormality. IMPRESSION: 1. Marked rectal wall thickening with surrounding fat stranding suggesting proctitis. 2. Trace left pleural effusion with atelectasis at the lung bases.  3. Small hiatal hernia. 4. Diverticulosis without diverticulitis. 5. Nodular contour of the left lobe of the liver suggesting cirrhosis. 6. Cholelithiasis. 7. Aortic atherosclerosis Electronically Signed   By: Thornell Sartorius M.D.   On: 02/24/2023 21:53   DG Abd 1 View  Result Date: 02/23/2023 CLINICAL DATA:  Acute abdominal pain EXAM: ABDOMEN - 1 VIEW COMPARISON:  Abdominal x-ray 09/21/2022 FINDINGS: There is gaseous distention of the stomach. Air seen throughout nondilated colon and small bowel to the level the rectum. There are no suspicious calcifications. There is dextroconvex curvature of the lumbar spine with degenerative change. Left hip arthroplasty is present. IMPRESSION: Gaseous distention of the stomach. Nonobstructive bowel gas pattern. Electronically Signed   By: Darliss Cheney M.D.   On: 02/23/2023 18:12   CT KNEE RIGHT WO CONTRAST  Result Date: 02/19/2023 CLINICAL DATA:  Knee pain EXAM: CT OF THE RIGHT KNEE WITHOUT CONTRAST TECHNIQUE: Multidetector CT imaging of the right knee was performed according to the standard protocol. Multiplanar CT image reconstructions were also generated. RADIATION DOSE REDUCTION: This exam was performed according to the departmental dose-optimization program which includes automated exposure control, adjustment of the mA and/or kV according to patient size and/or use of iterative reconstruction technique. COMPARISON:  02/18/2023 radiographs FINDINGS: Bones/Joint/Cartilage Meniscal chondrocalcinosis.  Large knee joint effusion. Cortical thickening posterolaterally in the lateral tibial plateau and posterolaterally along the lateral femoral condyle, possibly degenerative although subcortical stress fractures are not excluded. There is some widening of the lateral compartment posteriorly raise the possibility of a prior compression of the lateral tibial plateau but no well-defined acute cortical fracture is identified. Tricompartmental marginal spurring noted.  Ligaments Suboptimally assessed by CT. Muscles and Tendons Unremarkable Soft tissues Moderate-sized Baker's cyst. Distal SFA and popliteal artery atheromatous vascular calcification. Subcutaneous edema anterior to the junction of the quadriceps tendon and lateral patellar retinaculum. IMPRESSION: 1. Large knee joint effusion. 2. Cortical thickening posterolaterally in the lateral tibial plateau and posterolaterally along the lateral femoral condyle, possibly degenerative although subcortical stress fractures are not excluded. There is some widening of the lateral compartment posteriorly raise the possibility of a prior compression of the lateral tibial plateau but no well-defined acute cortical fracture is identified. 3. 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 4. Moderate-sized Baker's cyst. 5. Subcutaneous edema anterior to the junction of the quadriceps tendon and lateral patellar retinaculum. 6. Distal SFA and popliteal artery atheromatous vascular calcification. Electronically Signed   By: Gaylyn Rong M.D.   On: 02/19/2023 13:33   DG Knee 1-2 Views Right  Result Date: 02/18/2023 CLINICAL DATA:  Right knee pain and swelling following fall, initial encounter EXAM: RIGHT KNEE - 2 VIEW COMPARISON:  08/02/2022 FINDINGS: Tricompartmental degenerative changes are noted. There is suggestion of mildly displaced fracture through the intercondylar eminence of the proximal tibia best seen on the lateral projection. Cross-sectional imaging may be helpful for further evaluation. Large suprapatellar joint effusion is seen. IMPRESSION: Large joint effusion with findings suspicious for proximal tibial fracture. CT may be helpful when clinically able. Electronically Signed   By: Alcide Clever M.D.   On: 02/18/2023 22:12   US RENAL  Result Date: 02/18/2023 CLINICAL DATA:  Acute kidney injury. EXAM: RENAL / URINARY TRACT ULTRASOUND COMPLETE COMPARISON:  CT 12/08/2022, renal ultrasound  07/11/2022 FINDINGS: Right Kidney: Renal measurements: 8.1 x 4 x 3.3 cm = volume: 55 mL. Renal parenchymal atrophy and increased echogenicity. Simple cyst measures 3.6 cm. Additional smaller cysts. No hydronephrosis. Left Kidney: Renal measurements: 7.2 x 3.3 x 3.6 cm = volume: 44 mL. Renal parenchymal atrophy and increased echogenicity. There are multiple simple cysts, which were better appreciated on prior CT. No hydronephrosis. Bladder: Only minimally distended. Appears normal for degree of bladder distention. Other: None. IMPRESSION: 1. No obstructive uropathy. 2. Renal parenchymal atrophy and findings of chronic medical renal disease. Electronically Signed   By: Narda Rutherford M.D.   On: 02/18/2023 14:02   (Echo, Carotid, EGD, Colonoscopy, ERCP)    Subjective: Nonverbal.  Discharge Exam: Vitals:   03/19/23 0732 03/19/23 1028  BP: (!) 143/53   Pulse: 89   Resp: 17   Temp: 98.8 F (37.1 C)   SpO2: 92% 90%   Vitals:   03/18/23 2000 03/19/23 0525 03/19/23 0732 03/19/23 1028  BP: (!) 124/52 (!) 133/51 (!) 143/53   Pulse: 84 89 89   Resp: 17 18 17    Temp: (!) 97.2 F (36.2 C) 97.6 F (36.4 C) 98.8 F (37.1 C)   TempSrc: Oral Oral    SpO2: 92% 93% 92% 90%  Weight:      Height:        General: Pt is alert, awake, not in acute distress Cardiovascular: RRR, S1/S2 +, no rubs, no gallops Respiratory: CTA bilaterally, no wheezing, no rhonchi Abdominal: Soft, NT,  ND, bowel sounds + Extremities: no edema, no cyanosis    The results of significant diagnostics from this hospitalization (including imaging, microbiology, ancillary and laboratory) are listed below for reference.     Microbiology: Recent Results (from the past 240 hour(s))  SARS Coronavirus 2 by RT PCR (hospital order, performed in [facility] hospital lab) *cepheid single result test* Anterior Nasal Swab     Status: None   Collection Time: 03/16/23  3:25 PM   Specimen: Anterior Nasal Swab  Result Value Ref Range  Status   SARS Coronavirus 2 by RT PCR NEGATIVE NEGATIVE Final    Comment: Performed at [facility], [address], [place], [place] [address]  Respiratory (~20 pathogens) panel by PCR     Status: None   Collection Time: 03/16/23  7:36 PM   Specimen: Nasopharyngeal Swab; Respiratory  Result Value Ref Range Status   Adenovirus NOT DETECTED NOT DETECTED Final   Coronavirus 229E NOT DETECTED NOT DETECTED Final    Comment: (NOTE) The Coronavirus on the Respiratory Panel, DOES NOT test for the novel  Coronavirus (2019 nCoV)    Coronavirus HKU1 NOT DETECTED NOT DETECTED Final   Coronavirus NL63 NOT DETECTED NOT DETECTED Final   Coronavirus OC43 NOT DETECTED NOT DETECTED Final   Metapneumovirus NOT DETECTED NOT DETECTED Final   Rhinovirus / Enterovirus NOT DETECTED NOT DETECTED Final   Influenza A NOT DETECTED NOT DETECTED Final   Influenza B NOT DETECTED NOT DETECTED Final   Parainfluenza Virus 1 NOT DETECTED NOT DETECTED Final   Parainfluenza Virus 2 NOT DETECTED NOT DETECTED Final   Parainfluenza Virus 3 NOT DETECTED NOT DETECTED Final   Parainfluenza Virus 4 NOT DETECTED NOT DETECTED Final   Respiratory Syncytial Virus NOT DETECTED NOT DETECTED Final   Bordetella pertussis NOT DETECTED NOT DETECTED Final   Bordetella Parapertussis NOT DETECTED NOT DETECTED Final   Chlamydophila pneumoniae NOT DETECTED NOT DETECTED Final   Mycoplasma pneumoniae NOT DETECTED NOT DETECTED Final    Comment: Performed at [facility], [address] [place], [place] [address]  MRSA Next Gen by PCR, Nasal     Status: None   Collection Time: 03/17/23  3:23 AM   Specimen: Nasal Mucosa; Nasal Swab  Result Value Ref Range Status   MRSA by PCR Next Gen NOT DETECTED NOT DETECTED Final    Comment: (NOTE) The GeneXpert MRSA Assay (FDA approved for NASAL specimens only), is one component of a comprehensive MRSA colonization surveillance program. It is not intended to diagnose MRSA infection nor to  guide or monitor treatment for MRSA infections. Test performance is not FDA approved in patients less than 42 years old. Performed at [facility], [address], [place], [place] [address]      Labs: BNP (last 3 results) Recent Labs    07/12/22 0826 02/15/23 0550 03/16/23 1420  BNP 865.3* 211.0* 783.2*   Basic Metabolic Panel: Recent Labs  Lab 03/16/23 1420 03/16/23 2205 03/17/23 0323  NA 132*  --  136  K 3.5  --  3.9  CL 102  --  103  CO2 12*  --  9*  GLUCOSE 113*  --  147*  BUN 136*  --  144*  CREATININE 7.82*  --  8.29*  CALCIUM 7.4*  --  7.4*  MG  --  1.0* 1.6*  PHOS  --  10.0* 10.6*   Liver Function Tests: Recent Labs  Lab 03/16/23 1420 03/17/23 0323  AST 21  21  ALT 16 19  ALKPHOS 69 74  BILITOT 0.9 0.7  PROT 4.7* 5.1*  ALBUMIN 2.2* 2.3*   No results for input(s): "LIPASE", "AMYLASE" in the last 168 hours. Recent Labs  Lab 03/16/23 2205  AMMONIA <10   CBC: Recent Labs  Lab 03/16/23 1420 03/17/23 0323  WBC 10.3 9.3  NEUTROABS 7.7  --   HGB 7.8* 8.2*  HCT 23.6* 24.7*  MCV 95.9 97.2  PLT 242 281   Cardiac Enzymes: Recent Labs  Lab 03/16/23 2205  CKTOTAL 78   BNP: Invalid input(s): "POCBNP" CBG: No results for input(s): "GLUCAP" in the last 168 hours. D-Dimer No results for input(s): "DDIMER" in the last 72 hours. Hgb A1c No results for input(s): "HGBA1C" in the last 72 hours. Lipid Profile No results for input(s): "CHOL", "HDL", "LDLCALC", "TRIG", "CHOLHDL", "LDLDIRECT" in the last 72 hours. Thyroid function studies Recent Labs    03/16/23 2205  TSH 1.270   Anemia work up Recent Labs    03/16/23 2205 03/17/23 0323  VITAMINB12  --  6,237*  FOLATE  --  >40.0  FERRITIN 489*  --   TIBC 147*  --   IRON 19*  --   RETICCTPCT 3.1  --    Urinalysis    Component Value Date/Time   COLORURINE AMBER (A) 03/17/2023 0323   APPEARANCEUR CLOUDY (A) 03/17/2023 0323   LABSPEC 1.012 03/17/2023 0323   PHURINE 5.0 03/17/2023  0323   GLUCOSEU NEGATIVE 03/17/2023 0323   GLUCOSEU NEGATIVE 09/05/2022 1155   HGBUR NEGATIVE 03/17/2023 0323   BILIRUBINUR NEGATIVE 03/17/2023 0323   KETONESUR NEGATIVE 03/17/2023 0323   PROTEINUR >=300 (A) 03/17/2023 0323   UROBILINOGEN 0.2 09/05/2022 1155   NITRITE NEGATIVE 03/17/2023 0323   LEUKOCYTESUR LARGE (A) 03/17/2023 0323   Sepsis Labs Recent Labs  Lab 03/16/23 1420 03/17/23 0323  WBC 10.3 9.3   Microbiology Recent Results (from the past 240 hour(s))  SARS Coronavirus 2 by RT PCR (hospital order, performed in [facility] hospital lab) *cepheid single result test* Anterior Nasal Swab     Status: None   Collection Time: 03/16/23  3:25 PM   Specimen: Anterior Nasal Swab  Result Value Ref Range Status   SARS Coronavirus 2 by RT PCR NEGATIVE NEGATIVE Final    Comment: Performed at [facility], [address] [place], [place] [address]  Respiratory (~20 pathogens) panel by PCR     Status: None   Collection Time: 03/16/23  7:36 PM   Specimen: Nasopharyngeal Swab; Respiratory  Result Value Ref Range Status   Adenovirus NOT DETECTED NOT DETECTED Final   Coronavirus 229E NOT DETECTED NOT DETECTED Final    Comment: (NOTE) The Coronavirus on the Respiratory Panel, DOES NOT test for the novel  Coronavirus (2019 nCoV)    Coronavirus HKU1 NOT DETECTED NOT DETECTED Final   Coronavirus NL63 NOT DETECTED NOT DETECTED Final   Coronavirus OC43 NOT DETECTED NOT DETECTED Final   Metapneumovirus NOT DETECTED NOT DETECTED Final   Rhinovirus / Enterovirus NOT DETECTED NOT DETECTED Final   Influenza A NOT DETECTED NOT DETECTED Final   Influenza B NOT DETECTED NOT DETECTED Final   Parainfluenza Virus 1 NOT DETECTED NOT DETECTED Final   Parainfluenza Virus 2 NOT DETECTED NOT DETECTED Final   Parainfluenza Virus 3 NOT DETECTED NOT DETECTED Final   Parainfluenza Virus 4 NOT DETECTED NOT DETECTED Final   Respiratory Syncytial Virus NOT DETECTED NOT DETECTED Final   Bordetella  pertussis NOT DETECTED NOT  DETECTED Final   Bordetella Parapertussis NOT DETECTED NOT DETECTED Final   Chlamydophila pneumoniae NOT DETECTED NOT DETECTED Final   Mycoplasma pneumoniae NOT DETECTED NOT DETECTED Final    Comment: Performed at [facility], [address], [place], [place] [address]  MRSA Next Gen by PCR, Nasal     Status: None   Collection Time: 03/17/23  3:23 AM   Specimen: Nasal Mucosa; Nasal Swab  Result Value Ref Range Status   MRSA by PCR Next Gen NOT DETECTED NOT DETECTED Final    Comment: (NOTE) The GeneXpert MRSA Assay (FDA approved for NASAL specimens only), is one component of a comprehensive MRSA colonization surveillance program. It is not intended to diagnose MRSA infection nor to guide or monitor treatment for MRSA infections. Test performance is not FDA approved in patients less than 12 years old. Performed at [facility], [address], [place], [place] [address]      Time coordinating discharge: Over 3 minutes  SIGNED:   Marinda Elk, MD  [facility] 03/19/2023, 3:41 PM Pager   If 7PM-7AM, please contact night-coverage [email or web address] Password [email or web address]

## 2023-03-20 ENCOUNTER — Telehealth: Payer: Self-pay | Admitting: *Deleted

## 2023-03-21 ENCOUNTER — Ambulatory Visit: Payer: Medicare PPO | Admitting: Nurse Practitioner

## 2023-03-22 NOTE — Telephone Encounter (Signed)
FYI:  Pts son has stated "Dr Huntley Dec, Im sorry to write that my mother passed away at [facility] yesterday, November 19th. She was peaceful and comfortable at the end. The staff at [facility] was great."

## 2023-03-26 ENCOUNTER — Encounter (HOSPITAL_COMMUNITY): Payer: Medicare PPO

## 2023-03-27 ENCOUNTER — Encounter (HOSPITAL_COMMUNITY): Payer: Medicare PPO

## 2023-03-27 ENCOUNTER — Ambulatory Visit: Payer: Medicare PPO | Admitting: Orthopaedic Surgery

## 2023-03-27 NOTE — Telephone Encounter (Signed)
So sorry to hear this. Condolences to the family.

## 2023-04-01 NOTE — Patient Outreach (Signed)
  Care Coordination   Case Closure  Visit Note   03/30/2023 Name: [name] MRN: [id number] DOB: November 22, 1940  [name] is a 82 y.o. year old female who sees Plotnikov, Georgina Quint, MD for primary care. No patient contact was made during this encounter  RNCM received notification per [facility] nurse on 03/09/2023, that patient is residing at [facility] care. Case closed.   Goals Addressed             This Visit's Progress    COMPLETED: Care Coordination Activities       Interventions Today    Flowsheet Row Most Recent Value  General Interventions   General Interventions Discussed/Reviewed General Interventions Reviewed  [Goals closed]           COMPLETED: Health management       Interventions Today    Flowsheet Row Most Recent Value  General Interventions   General Interventions Discussed/Reviewed General Interventions Reviewed  [Goals closed]             SDOH assessments and interventions completed:  No  Care Coordination Interventions:  No, not indicated   Follow up plan: No further intervention required.   Encounter Outcome:  Patient Visit Completed   Kathyrn Sheriff, RN, MSN, BSN, CCM Care Management Coordinator [phone]

## 2023-04-01 NOTE — Patient Outreach (Signed)
  Care Coordination   [facility] Care Coordination Telephone  Visit Note   03/16/2023 Name: [name] MRN: [id number] DOB: Sep 14, 1940  [name] is a 82 y.o. year old female who sees Plotnikov, Georgina Quint, MD for primary care. I  Upon review of EHR, determined patient had orders to transfer  to [facility] inpatient hospice facility: completed care coordination outreach to [facility] and confirmed that patient is currently at their facility under inpatient hospice services; [facility] call deferred accordingly   SDOH assessments and interventions completed:  No   Care Coordination Interventions:  Yes, provided   Follow up plan: No further intervention required.   Encounter Outcome:  Patient Visit Completed   Caryl Pina, RN, BSN, CCRN Alumnus RN Care Manager  Transitions of Care  [facility] - [facility] [phone]: direct office

## 2023-04-01 NOTE — Consult Note (Signed)
[facility] Liaison Consult Note    03/03/2023  JAYDE ERWIN Sep 23, 1940 [id number]  Covering [facility] hospital liaison)  Primary Care Provider:   [name], MD with [place] [facility]   Patient is currently active with Care Management for chronic disease management services.  Patient has been engaged by a RNCM and SW with [facility].  Our community based plan of care has focused on disease management and community resource support.   Plan: Based upon pt's discharge disposition. Liaison will collaborate with the [facility] team and requested closure of this case due to end of life measures at the hospice facility. With a discharged on 03/19/2023.  Of note, Care Management services does not replace or interfere with any services that are needed or arranged by inpatient [facility] care management team.   For additional questions or referrals please contact:    Elliot Cousin, RN, [facility] Liaison [place]   [facility], Population Health Office Hours MTWF  8:00 am-6:00 pm Direct Dial: [phone] mobile [phone] [Office toll free line] Office Hours are M-F 8:30 - 5 pm [email or web address]

## 2023-04-01 DEATH — deceased
